# Patient Record
Sex: Male | Born: 1940 | Race: White | Hispanic: No | Marital: Married | Smoking: Never smoker
Health system: Southern US, Community
[De-identification: ages and names within clinical notes are randomized; demographics above are authoritative.]

## PROBLEM LIST (undated history)

## (undated) DIAGNOSIS — E039 Hypothyroidism, unspecified: Secondary | ICD-10-CM

## (undated) DIAGNOSIS — E236 Other disorders of pituitary gland: Secondary | ICD-10-CM

## (undated) DIAGNOSIS — I10 Essential (primary) hypertension: Secondary | ICD-10-CM

## (undated) DIAGNOSIS — I2510 Atherosclerotic heart disease of native coronary artery without angina pectoris: Secondary | ICD-10-CM

## (undated) DIAGNOSIS — I4891 Unspecified atrial fibrillation: Secondary | ICD-10-CM

## (undated) DIAGNOSIS — M1990 Unspecified osteoarthritis, unspecified site: Secondary | ICD-10-CM

## (undated) DIAGNOSIS — J189 Pneumonia, unspecified organism: Secondary | ICD-10-CM

## (undated) DIAGNOSIS — F419 Anxiety disorder, unspecified: Secondary | ICD-10-CM

## (undated) DIAGNOSIS — T7840XA Allergy, unspecified, initial encounter: Secondary | ICD-10-CM

## (undated) DIAGNOSIS — C61 Malignant neoplasm of prostate: Secondary | ICD-10-CM

## (undated) DIAGNOSIS — N200 Calculus of kidney: Secondary | ICD-10-CM

## (undated) DIAGNOSIS — C801 Malignant (primary) neoplasm, unspecified: Secondary | ICD-10-CM

## (undated) DIAGNOSIS — E119 Type 2 diabetes mellitus without complications: Secondary | ICD-10-CM

## (undated) DIAGNOSIS — G4733 Obstructive sleep apnea (adult) (pediatric): Secondary | ICD-10-CM

## (undated) DIAGNOSIS — C649 Malignant neoplasm of unspecified kidney, except renal pelvis: Secondary | ICD-10-CM

## (undated) DIAGNOSIS — Z96659 Presence of unspecified artificial knee joint: Secondary | ICD-10-CM

## (undated) HISTORY — DX: Essential (primary) hypertension: I10

## (undated) HISTORY — PX: INSERTION PROSTATE RADIATION SEED: SUR718

## (undated) HISTORY — DX: Allergy, unspecified, initial encounter: T78.40XA

## (undated) HISTORY — PX: KIDNEY STONE SURGERY: SHX686

## (undated) HISTORY — PX: NEPHRECTOMY: SHX65

## (undated) HISTORY — PX: CHOLECYSTECTOMY: SHX55

## (undated) HISTORY — PX: COLONOSCOPY W/ POLYPECTOMY: SHX1380

## (undated) HISTORY — PX: APPENDECTOMY: SHX54

## (undated) HISTORY — DX: Malignant neoplasm of prostate: C61

## (undated) HISTORY — PX: REPLACEMENT TOTAL KNEE BILATERAL: SUR1225

## (undated) HISTORY — DX: Obstructive sleep apnea (adult) (pediatric): G47.33

## (undated) HISTORY — DX: Calculus of kidney: N20.0

## (undated) NOTE — *Deleted (*Deleted)
Health Maintenance Due  Topic Date Due  . INFLUENZA VACCINE  01/28/2020   Depression screen [facility] 2/9 12/13/2019 07/31/2019 06/21/2019  Decreased Interest 0 0 0  Down, Depressed, Hopeless 0 0 0  PHQ - 2 Score 0 0 0  Altered sleeping 0 0 0  Tired, decreased energy 0 0 1  Change in appetite 0 0 0  Feeling bad or failure about yourself  0 0 0  Trouble concentrating 0 0 0  Moving slowly or fidgety/restless 0 0 0  Suicidal thoughts 0 0 0  PHQ-9 Score 0 0 1  Difficult doing work/chores Not difficult at all Not difficult at all Not difficult at all  Some recent data might be hidden

---

## 1999-01-28 ENCOUNTER — Encounter: Payer: Self-pay | Admitting: Orthopedic Surgery

## 1999-01-28 ENCOUNTER — Ambulatory Visit (HOSPITAL_COMMUNITY): Admission: RE | Admit: 1999-01-28 | Discharge: 1999-01-28 | Payer: Self-pay | Admitting: Orthopedic Surgery

## 2000-03-05 ENCOUNTER — Ambulatory Visit (HOSPITAL_BASED_OUTPATIENT_CLINIC_OR_DEPARTMENT_OTHER): Admission: RE | Admit: 2000-03-05 | Discharge: 2000-03-05 | Payer: Self-pay | Admitting: Internal Medicine

## 2000-09-15 ENCOUNTER — Encounter: Payer: Self-pay | Admitting: Emergency Medicine

## 2000-09-15 ENCOUNTER — Emergency Department (HOSPITAL_COMMUNITY): Admission: EM | Admit: 2000-09-15 | Discharge: 2000-09-15 | Payer: Self-pay | Admitting: Emergency Medicine

## 2000-10-19 ENCOUNTER — Observation Stay (HOSPITAL_COMMUNITY): Admission: RE | Admit: 2000-10-19 | Discharge: 2000-10-20 | Payer: Self-pay | Admitting: General Surgery

## 2000-10-19 ENCOUNTER — Encounter (INDEPENDENT_AMBULATORY_CARE_PROVIDER_SITE_OTHER): Payer: Self-pay

## 2001-08-24 ENCOUNTER — Encounter: Admission: RE | Admit: 2001-08-24 | Discharge: 2001-11-22 | Payer: Self-pay | Admitting: Internal Medicine

## 2003-05-30 LAB — HM COLONOSCOPY

## 2003-06-11 ENCOUNTER — Encounter: Payer: Self-pay | Admitting: Internal Medicine

## 2004-05-08 ENCOUNTER — Ambulatory Visit: Payer: Self-pay | Admitting: Internal Medicine

## 2004-07-14 ENCOUNTER — Ambulatory Visit: Payer: Self-pay | Admitting: Internal Medicine

## 2004-07-21 ENCOUNTER — Ambulatory Visit: Payer: Self-pay | Admitting: Internal Medicine

## 2004-07-24 ENCOUNTER — Encounter: Payer: Self-pay | Admitting: Internal Medicine

## 2004-07-28 ENCOUNTER — Encounter: Admission: RE | Admit: 2004-07-28 | Discharge: 2004-10-26 | Payer: Self-pay | Admitting: Internal Medicine

## 2004-08-13 ENCOUNTER — Ambulatory Visit: Payer: Self-pay | Admitting: Internal Medicine

## 2004-09-24 ENCOUNTER — Ambulatory Visit: Payer: Self-pay | Admitting: Internal Medicine

## 2005-01-14 ENCOUNTER — Ambulatory Visit: Payer: Self-pay | Admitting: Internal Medicine

## 2005-01-22 ENCOUNTER — Ambulatory Visit: Payer: Self-pay | Admitting: Internal Medicine

## 2005-02-05 ENCOUNTER — Ambulatory Visit: Payer: Self-pay | Admitting: Internal Medicine

## 2005-05-29 ENCOUNTER — Ambulatory Visit: Payer: Self-pay | Admitting: Internal Medicine

## 2005-06-02 ENCOUNTER — Ambulatory Visit: Payer: Self-pay | Admitting: Cardiology

## 2005-06-02 ENCOUNTER — Ambulatory Visit: Payer: Self-pay | Admitting: Internal Medicine

## 2005-06-29 HISTORY — PX: KNEE ARTHROSCOPY: SUR90

## 2005-07-06 ENCOUNTER — Ambulatory Visit: Payer: Self-pay | Admitting: Internal Medicine

## 2005-07-06 ENCOUNTER — Ambulatory Visit: Payer: Self-pay | Admitting: Cardiology

## 2005-10-09 ENCOUNTER — Ambulatory Visit: Payer: Self-pay | Admitting: Internal Medicine

## 2006-03-11 ENCOUNTER — Ambulatory Visit (HOSPITAL_COMMUNITY): Admission: RE | Admit: 2006-03-11 | Discharge: 2006-03-12 | Payer: Self-pay | Admitting: Orthopedic Surgery

## 2006-06-29 HISTORY — PX: CARDIAC CATHETERIZATION: SHX172

## 2006-09-07 ENCOUNTER — Ambulatory Visit: Payer: Self-pay | Admitting: Internal Medicine

## 2006-09-07 LAB — CONVERTED CEMR LAB
ALT: 28 units/L (ref 0–40)
AST: 26 units/L (ref 0–37)
Albumin: 3.2 g/dL — ABNORMAL LOW (ref 3.5–5.2)
Alkaline Phosphatase: 64 units/L (ref 39–117)
BUN: 21 mg/dL (ref 6–23)
Basophils Absolute: 0.1 10*3/uL (ref 0.0–0.1)
Basophils Relative: 0.7 % (ref 0.0–1.0)
Bilirubin, Direct: 0.1 mg/dL (ref 0.0–0.3)
CO2: 30 meq/L (ref 19–32)
Calcium: 9.2 mg/dL (ref 8.4–10.5)
Chloride: 104 meq/L (ref 96–112)
Cholesterol: 208 mg/dL (ref 0–200)
Creatinine, Ser: 0.9 mg/dL (ref 0.4–1.5)
Direct LDL: 62 mg/dL
Eosinophils Absolute: 0.4 10*3/uL (ref 0.0–0.6)
Eosinophils Relative: 4.6 % (ref 0.0–5.0)
GFR calc Af Amer: 109 mL/min
GFR calc non Af Amer: 90 mL/min
Glucose, Bld: 83 mg/dL (ref 70–99)
HCT: 43.4 % (ref 39.0–52.0)
HDL: 43.1 mg/dL (ref >39.0)
Hemoglobin: 14.9 g/dL (ref 13.0–17.0)
Lymphocytes Relative: 26.4 % (ref 12.0–46.0)
MCHC: 34.3 g/dL (ref 30.0–36.0)
MCV: 92.2 fL (ref 78.0–100.0)
Monocytes Absolute: 1 10*3/uL — ABNORMAL HIGH (ref 0.2–0.7)
Monocytes Relative: 10.9 % (ref 3.0–11.0)
Neutro Abs: 5.3 10*3/uL (ref 1.4–7.7)
Neutrophils Relative %: 57.4 % (ref 43.0–77.0)
PSA: 0.88 ng/mL (ref 0.10–4.00)
Platelets: 283 10*3/uL (ref 150–400)
Potassium: 4 meq/L (ref 3.5–5.1)
RBC: 4.71 M/uL (ref 4.22–5.81)
RDW: 12.6 % (ref 11.5–14.6)
Sodium: 143 meq/L (ref 135–145)
TSH: 3.22 microintl units/mL (ref 0.35–5.50)
Total Bilirubin: 0.7 mg/dL (ref 0.3–1.2)
Total CHOL/HDL Ratio: 4.8
Total Protein: 6.5 g/dL (ref 6.0–8.3)
Triglycerides: 233 mg/dL (ref 0–149)
VLDL: 47 mg/dL — ABNORMAL HIGH (ref 0–40)
WBC: 9.2 10*3/uL (ref 4.5–10.5)

## 2006-09-14 ENCOUNTER — Ambulatory Visit: Payer: Self-pay | Admitting: Internal Medicine

## 2006-10-13 ENCOUNTER — Ambulatory Visit: Payer: Self-pay

## 2006-10-21 ENCOUNTER — Ambulatory Visit: Payer: Self-pay | Admitting: Cardiology

## 2006-10-21 LAB — CONVERTED CEMR LAB
BUN: 13 mg/dL (ref 6–23)
Basophils Absolute: 0.1 10*3/uL (ref 0.0–0.1)
Eosinophils Absolute: 0.4 10*3/uL (ref 0.0–0.6)
GFR calc non Af Amer: 103 mL/min
HCT: 45 % (ref 39.0–52.0)
INR: 1 (ref 0.9–2.0)
MCHC: 34.6 g/dL (ref 30.0–36.0)
MCV: 91 fL (ref 78.0–100.0)
Monocytes Absolute: 1.2 10*3/uL — ABNORMAL HIGH (ref 0.2–0.7)
Monocytes Relative: 11.6 % — ABNORMAL HIGH (ref 3.0–11.0)
Neutrophils Relative %: 63.3 % (ref 43.0–77.0)
Potassium: 3.5 meq/L (ref 3.5–5.1)
RBC: 4.95 M/uL (ref 4.22–5.81)
RDW: 12.6 % (ref 11.5–14.6)
Sodium: 137 meq/L (ref 135–145)
aPTT: 24.4 s — ABNORMAL LOW (ref 26.5–36.5)

## 2006-10-22 ENCOUNTER — Inpatient Hospital Stay (HOSPITAL_BASED_OUTPATIENT_CLINIC_OR_DEPARTMENT_OTHER): Admission: RE | Admit: 2006-10-22 | Discharge: 2006-10-22 | Payer: Self-pay | Admitting: Internal Medicine

## 2006-10-22 ENCOUNTER — Ambulatory Visit: Payer: Self-pay | Admitting: Internal Medicine

## 2006-10-28 ENCOUNTER — Inpatient Hospital Stay (HOSPITAL_COMMUNITY): Admission: RE | Admit: 2006-10-28 | Discharge: 2006-10-31 | Payer: Self-pay | Admitting: Orthopedic Surgery

## 2006-10-28 DIAGNOSIS — Z96652 Presence of left artificial knee joint: Secondary | ICD-10-CM | POA: Insufficient documentation

## 2006-12-22 ENCOUNTER — Ambulatory Visit: Payer: Self-pay | Admitting: Internal Medicine

## 2006-12-27 ENCOUNTER — Ambulatory Visit: Payer: Self-pay | Admitting: Internal Medicine

## 2006-12-28 DIAGNOSIS — I10 Essential (primary) hypertension: Secondary | ICD-10-CM | POA: Insufficient documentation

## 2006-12-28 DIAGNOSIS — E291 Testicular hypofunction: Secondary | ICD-10-CM | POA: Insufficient documentation

## 2007-02-03 ENCOUNTER — Telehealth (INDEPENDENT_AMBULATORY_CARE_PROVIDER_SITE_OTHER): Payer: Self-pay | Admitting: *Deleted

## 2007-03-16 ENCOUNTER — Telehealth: Payer: Self-pay | Admitting: Internal Medicine

## 2007-04-12 ENCOUNTER — Ambulatory Visit: Payer: Self-pay | Admitting: Internal Medicine

## 2007-06-03 ENCOUNTER — Ambulatory Visit: Payer: Self-pay | Admitting: Internal Medicine

## 2007-06-28 ENCOUNTER — Telehealth: Payer: Self-pay | Admitting: Internal Medicine

## 2007-08-05 ENCOUNTER — Telehealth: Payer: Self-pay | Admitting: Internal Medicine

## 2007-09-09 ENCOUNTER — Ambulatory Visit: Payer: Self-pay | Admitting: Internal Medicine

## 2007-09-09 LAB — CONVERTED CEMR LAB
ALT: 30 units/L (ref 0–53)
Basophils Relative: 1 % (ref 0.0–1.0)
Bilirubin, Direct: 0.2 mg/dL (ref 0.0–0.3)
CO2: 30 meq/L (ref 19–32)
Calcium: 9.2 mg/dL (ref 8.4–10.5)
Eosinophils Relative: 3.6 % (ref 0.0–5.0)
GFR calc Af Amer: 109 mL/min
Glucose, Bld: 74 mg/dL (ref 70–99)
Glucose, Urine, Semiquant: NEGATIVE
HCT: 45 % (ref 39.0–52.0)
Hemoglobin: 15.3 g/dL (ref 13.0–17.0)
Lymphocytes Relative: 26.5 % (ref 12.0–46.0)
Monocytes Absolute: 0.9 10*3/uL — ABNORMAL HIGH (ref 0.2–0.7)
Neutro Abs: 5.5 10*3/uL (ref 1.4–7.7)
Neutrophils Relative %: 59.2 % (ref 43.0–77.0)
Nitrite: NEGATIVE
Platelets: 283 10*3/uL (ref 150–400)
Sodium: 143 meq/L (ref 135–145)
Specific Gravity, Urine: 1.025
Total Protein: 6.6 g/dL (ref 6.0–8.3)
VLDL: 27 mg/dL (ref 0–40)
WBC: 9.2 10*3/uL (ref 4.5–10.5)
pH: 5.5

## 2007-09-22 ENCOUNTER — Ambulatory Visit: Payer: Self-pay | Admitting: Internal Medicine

## 2007-09-22 LAB — CONVERTED CEMR LAB
Glucose, Urine, Semiquant: NEGATIVE
Urobilinogen, UA: 1
pH: 6

## 2007-09-26 ENCOUNTER — Telehealth: Payer: Self-pay | Admitting: Internal Medicine

## 2007-09-26 ENCOUNTER — Ambulatory Visit: Payer: Self-pay | Admitting: Cardiology

## 2007-09-28 ENCOUNTER — Ambulatory Visit: Payer: Self-pay | Admitting: Internal Medicine

## 2007-09-30 ENCOUNTER — Encounter: Payer: Self-pay | Admitting: Internal Medicine

## 2007-10-05 ENCOUNTER — Ambulatory Visit (HOSPITAL_COMMUNITY): Admission: RE | Admit: 2007-10-05 | Discharge: 2007-10-05 | Payer: Self-pay | Admitting: Urology

## 2007-10-17 ENCOUNTER — Telehealth: Payer: Self-pay | Admitting: Internal Medicine

## 2007-10-21 ENCOUNTER — Ambulatory Visit (HOSPITAL_COMMUNITY): Admission: RE | Admit: 2007-10-21 | Discharge: 2007-10-21 | Payer: Self-pay | Admitting: Urology

## 2007-10-21 ENCOUNTER — Encounter: Payer: Self-pay | Admitting: Internal Medicine

## 2007-10-24 ENCOUNTER — Encounter: Payer: Self-pay | Admitting: Internal Medicine

## 2007-10-28 ENCOUNTER — Encounter: Payer: Self-pay | Admitting: Internal Medicine

## 2007-11-30 ENCOUNTER — Encounter (INDEPENDENT_AMBULATORY_CARE_PROVIDER_SITE_OTHER): Payer: Self-pay | Admitting: Urology

## 2007-11-30 ENCOUNTER — Inpatient Hospital Stay (HOSPITAL_COMMUNITY): Admission: RE | Admit: 2007-11-30 | Discharge: 2007-12-05 | Payer: Self-pay | Admitting: Urology

## 2007-12-14 ENCOUNTER — Encounter: Payer: Self-pay | Admitting: Internal Medicine

## 2007-12-26 ENCOUNTER — Ambulatory Visit: Payer: Self-pay | Admitting: Internal Medicine

## 2007-12-26 DIAGNOSIS — Z85528 Personal history of other malignant neoplasm of kidney: Secondary | ICD-10-CM | POA: Insufficient documentation

## 2007-12-26 DIAGNOSIS — G4700 Insomnia, unspecified: Secondary | ICD-10-CM | POA: Insufficient documentation

## 2008-01-02 ENCOUNTER — Telehealth: Payer: Self-pay | Admitting: Internal Medicine

## 2008-02-09 ENCOUNTER — Encounter (INDEPENDENT_AMBULATORY_CARE_PROVIDER_SITE_OTHER): Payer: Self-pay | Admitting: Urology

## 2008-02-09 ENCOUNTER — Ambulatory Visit (HOSPITAL_COMMUNITY): Admission: RE | Admit: 2008-02-09 | Discharge: 2008-02-09 | Payer: Self-pay | Admitting: Urology

## 2008-02-14 ENCOUNTER — Ambulatory Visit: Payer: Self-pay | Admitting: Internal Medicine

## 2008-03-07 ENCOUNTER — Ambulatory Visit: Payer: Self-pay | Admitting: Internal Medicine

## 2008-03-15 ENCOUNTER — Telehealth: Payer: Self-pay | Admitting: Internal Medicine

## 2008-04-10 ENCOUNTER — Encounter: Payer: Self-pay | Admitting: Internal Medicine

## 2008-04-11 ENCOUNTER — Telehealth: Payer: Self-pay | Admitting: Internal Medicine

## 2008-07-02 ENCOUNTER — Telehealth: Payer: Self-pay | Admitting: Internal Medicine

## 2008-07-11 ENCOUNTER — Encounter: Payer: Self-pay | Admitting: Internal Medicine

## 2008-07-11 ENCOUNTER — Ambulatory Visit (HOSPITAL_COMMUNITY): Admission: RE | Admit: 2008-07-11 | Discharge: 2008-07-11 | Payer: Self-pay | Admitting: Urology

## 2008-07-26 ENCOUNTER — Ambulatory Visit: Payer: Self-pay | Admitting: Gastroenterology

## 2008-08-07 ENCOUNTER — Ambulatory Visit: Admission: RE | Admit: 2008-08-07 | Discharge: 2008-10-01 | Payer: Self-pay | Admitting: Radiation Oncology

## 2008-08-08 ENCOUNTER — Encounter: Payer: Self-pay | Admitting: Internal Medicine

## 2008-08-10 ENCOUNTER — Ambulatory Visit: Payer: Self-pay | Admitting: Gastroenterology

## 2008-08-27 ENCOUNTER — Inpatient Hospital Stay (HOSPITAL_COMMUNITY): Admission: AD | Admit: 2008-08-27 | Discharge: 2008-09-01 | Payer: Self-pay | Admitting: Internal Medicine

## 2008-08-27 ENCOUNTER — Ambulatory Visit: Payer: Self-pay | Admitting: Internal Medicine

## 2008-09-12 ENCOUNTER — Ambulatory Visit: Payer: Self-pay | Admitting: Internal Medicine

## 2008-10-01 ENCOUNTER — Ambulatory Visit: Admission: RE | Admit: 2008-10-01 | Discharge: 2008-12-09 | Payer: Self-pay | Admitting: Radiation Oncology

## 2008-10-02 ENCOUNTER — Ambulatory Visit: Payer: Self-pay | Admitting: Internal Medicine

## 2008-10-02 DIAGNOSIS — Z8546 Personal history of malignant neoplasm of prostate: Secondary | ICD-10-CM | POA: Insufficient documentation

## 2008-10-04 ENCOUNTER — Encounter: Admission: RE | Admit: 2008-10-04 | Discharge: 2009-01-02 | Payer: Self-pay | Admitting: Internal Medicine

## 2008-10-08 ENCOUNTER — Ambulatory Visit (HOSPITAL_BASED_OUTPATIENT_CLINIC_OR_DEPARTMENT_OTHER): Admission: RE | Admit: 2008-10-08 | Discharge: 2008-10-09 | Payer: Self-pay | Admitting: Urology

## 2008-10-23 ENCOUNTER — Encounter: Payer: Self-pay | Admitting: Internal Medicine

## 2008-11-06 ENCOUNTER — Encounter: Payer: Self-pay | Admitting: Internal Medicine

## 2008-11-13 ENCOUNTER — Ambulatory Visit: Payer: Self-pay | Admitting: Internal Medicine

## 2008-11-13 DIAGNOSIS — M1990 Unspecified osteoarthritis, unspecified site: Secondary | ICD-10-CM | POA: Insufficient documentation

## 2008-11-16 LAB — CONVERTED CEMR LAB
CO2: 29 meq/L (ref 19–32)
Calcium: 9.1 mg/dL (ref 8.4–10.5)
Creatinine, Ser: 1.4 mg/dL (ref 0.4–1.5)
Glucose, Bld: 84 mg/dL (ref 70–99)

## 2008-11-27 ENCOUNTER — Encounter: Payer: Self-pay | Admitting: Internal Medicine

## 2008-12-17 ENCOUNTER — Inpatient Hospital Stay (HOSPITAL_COMMUNITY): Admission: RE | Admit: 2008-12-17 | Discharge: 2008-12-19 | Payer: Self-pay | Admitting: Orthopedic Surgery

## 2008-12-17 DIAGNOSIS — Z96651 Presence of right artificial knee joint: Secondary | ICD-10-CM | POA: Insufficient documentation

## 2009-02-08 ENCOUNTER — Ambulatory Visit (HOSPITAL_COMMUNITY): Admission: RE | Admit: 2009-02-08 | Discharge: 2009-02-08 | Payer: Self-pay | Admitting: Urology

## 2009-02-12 ENCOUNTER — Ambulatory Visit: Payer: Self-pay | Admitting: Internal Medicine

## 2009-02-15 ENCOUNTER — Encounter: Payer: Self-pay | Admitting: Internal Medicine

## 2009-03-14 ENCOUNTER — Ambulatory Visit: Payer: Self-pay | Admitting: Internal Medicine

## 2009-04-01 ENCOUNTER — Encounter: Payer: Self-pay | Admitting: Internal Medicine

## 2009-05-06 ENCOUNTER — Ambulatory Visit: Payer: Self-pay | Admitting: Internal Medicine

## 2009-05-06 ENCOUNTER — Telehealth: Payer: Self-pay | Admitting: Internal Medicine

## 2009-05-06 DIAGNOSIS — M6530 Trigger finger, unspecified finger: Secondary | ICD-10-CM | POA: Insufficient documentation

## 2009-05-06 DIAGNOSIS — M79609 Pain in unspecified limb: Secondary | ICD-10-CM | POA: Insufficient documentation

## 2009-05-06 LAB — CONVERTED CEMR LAB
Basophils Absolute: 0.1 10*3/uL (ref 0.0–0.1)
Basophils Relative: 0.6 % (ref 0.0–3.0)
CO2: 31 meq/L (ref 19–32)
Calcium: 9.1 mg/dL (ref 8.4–10.5)
Chloride: 99 meq/L (ref 96–112)
Creatinine, Ser: 1.4 mg/dL (ref 0.4–1.5)
Eosinophils Absolute: 0.4 10*3/uL (ref 0.0–0.7)
Glucose, Bld: 85 mg/dL (ref 70–99)
Hemoglobin: 15.1 g/dL (ref 13.0–17.0)
Lymphocytes Relative: 22.9 % (ref 12.0–46.0)
MCHC: 34.3 g/dL (ref 30.0–36.0)
MCV: 90.5 fL (ref 78.0–100.0)
Monocytes Absolute: 1 10*3/uL (ref 0.1–1.0)
Neutro Abs: 5.4 10*3/uL (ref 1.4–7.7)
RBC: 4.88 M/uL (ref 4.22–5.81)
RDW: 13.8 % (ref 11.5–14.6)

## 2009-06-03 ENCOUNTER — Telehealth: Payer: Self-pay | Admitting: Internal Medicine

## 2009-06-19 ENCOUNTER — Encounter: Payer: Self-pay | Admitting: Internal Medicine

## 2009-06-29 DIAGNOSIS — G250 Essential tremor: Secondary | ICD-10-CM | POA: Insufficient documentation

## 2009-10-01 ENCOUNTER — Telehealth: Payer: Self-pay | Admitting: Internal Medicine

## 2009-10-10 ENCOUNTER — Encounter: Payer: Self-pay | Admitting: Internal Medicine

## 2009-10-15 ENCOUNTER — Encounter: Payer: Self-pay | Admitting: Internal Medicine

## 2009-11-27 ENCOUNTER — Ambulatory Visit: Payer: Self-pay | Admitting: Internal Medicine

## 2009-11-27 DIAGNOSIS — L570 Actinic keratosis: Secondary | ICD-10-CM | POA: Insufficient documentation

## 2009-12-04 ENCOUNTER — Encounter: Payer: Self-pay | Admitting: Internal Medicine

## 2010-02-13 ENCOUNTER — Ambulatory Visit: Payer: Self-pay | Admitting: Family Medicine

## 2010-03-04 ENCOUNTER — Ambulatory Visit: Payer: Self-pay | Admitting: Internal Medicine

## 2010-03-07 LAB — CONVERTED CEMR LAB
BUN: 17 mg/dL (ref 6–23)
Bilirubin, Direct: 0.2 mg/dL (ref 0.0–0.3)
CRP, High Sensitivity: 77.8 — ABNORMAL HIGH (ref 0.00–5.00)
Calcium: 8.7 mg/dL (ref 8.4–10.5)
Creatinine, Ser: 1.3 mg/dL (ref 0.4–1.5)
GFR calc non Af Amer: 58.4 mL/min (ref >60)
Sed Rate: 23 mm/hr — ABNORMAL HIGH (ref 0–22)
Total Bilirubin: 0.7 mg/dL (ref 0.3–1.2)

## 2010-03-14 ENCOUNTER — Encounter: Payer: Self-pay | Admitting: Internal Medicine

## 2010-03-24 ENCOUNTER — Ambulatory Visit: Payer: Self-pay | Admitting: Internal Medicine

## 2010-03-28 LAB — CONVERTED CEMR LAB: CRP, High Sensitivity: 7.53 — ABNORMAL HIGH (ref 0.00–5.00)

## 2010-04-16 ENCOUNTER — Telehealth: Payer: Self-pay | Admitting: Internal Medicine

## 2010-04-30 ENCOUNTER — Encounter
Admission: RE | Admit: 2010-04-30 | Discharge: 2010-05-13 | Payer: Self-pay | Source: Home / Self Care | Attending: Internal Medicine | Admitting: Internal Medicine

## 2010-05-09 ENCOUNTER — Ambulatory Visit: Payer: Self-pay | Admitting: Internal Medicine

## 2010-06-06 ENCOUNTER — Encounter: Payer: Self-pay | Admitting: Internal Medicine

## 2010-07-31 NOTE — Assessment & Plan Note (Signed)
Summary: wart near eye//ccm   Procedure Note Last Tetanus: Historical (06/29/1994)  Mole Biopsy/Removal: Onset of lesion: months  Procedure # 1: liquid nitrogen    Size (in cm): 0.5 x 0.5    Location: left lower eyelid    Comment: verbal consent    Anesthesia: none   Allergies: No Known Drug Allergies   Impression & Recommendations: AK near lower left eyelid liquid NTG  Complete Medication List: 1)  Cozaar 100 Mg Tabs (Losartan potassium) .... Take 1 tablet by mouth once a day 2)  Celexa 20 Mg Tabs (Citalopram hydrobromide) .... Take 1 tablet by mouth once a day 3)  Trazodone Hcl 50 Mg Tabs (Trazodone hcl) .... 1/2-1 tab at bedtime as needed 4)  Hydrochlorothiazide 25 Mg Tabs (Hydrochlorothiazide) .... Take 1 tab by mouth every morning 5)  Transderm-scop 1.5 Mg Pt72 (Scopolamine base) .... Apply q 3 days 6)  Proair Hfa 108 (90 Base) Mcg/act Aers (Albuterol sulfate) .... 2 inh q4h as needed shortness of breath 7)  Celebrex 200 Mg Caps (Celecoxib) .... Take 1 capsule by mouth once a day 8)  Tylenol 325 Mg Tabs (Acetaminophen) .... Take 2 four times a day 9)  Xenical 120 Mg Caps (Orlistat)  Other Orders: Prescription Created Electronically [phone]) Cryotherapy/Destruction benign or premalignant lesion (1st lesion)  (17000) Prescriptions: TRAZODONE HCL 50 MG TABS (TRAZODONE HCL) 1/2-1 tab at bedtime as needed  #30 x PRN   Entered and Authorized by:   Birdie Sons MD   Signed by:   Birdie Sons MD on 11/27/2009   Method used:   Electronically to        [facility] (retail)       [address]       [place], [place]  [address]       Ph: [phone] or [phone]       Fax: [phone]   RxID:   [id number]

## 2010-07-31 NOTE — Assessment & Plan Note (Signed)
Summary: chills,sweats/njr   Vital Signs:  Patient profile:   70 year old male Height:      69.5 inches Weight:      256 pounds BMI:     37.40 Temp:     98.4 degrees F oral BP sitting:   120 / 80  (left arm) Cuff size:   large  Vitals Entered By: Kern Reap CMA Duncan Dull) (March 04, 2010 11:33 AM) CC: nausea, chills, flushing Is Patient Diabetic? No Pain Assessment Patient in pain? no        CC:  nausea, chills, and flushing.  History of Present Illness: reviewed note form 8/11 he admits to having felt better after initial viral illness 2 days ago he developed some chills and gagging sensation associated with some sweating. GI sxs have resolved no abd pain, no other associated sxs  All other systems reviewed and were negative    Current Medications (verified): 1)  Cozaar 100 Mg Tabs (Losartan Potassium) .... Take 1 Tablet By Mouth Once A Day 2)  Celexa 20 Mg Tabs (Citalopram Hydrobromide) .... Take 1 Tablet By Mouth Once A Day 3)  Trazodone Hcl 50 Mg Tabs (Trazodone Hcl) .... 1/2-1 Tab At Bedtime As Needed 4)  Hydrochlorothiazide 25 Mg  Tabs (Hydrochlorothiazide) .... Take 1 Tab By Mouth Every Morning 5)  Proair Hfa 108 (90 Base) Mcg/act  Aers (Albuterol Sulfate) .... 2 Inh Q4h As Needed Shortness of Breath  Allergies (verified): No Known Drug Allergies  Review of Systems       Flu Vaccine Consent Questions     Do you have a history of severe allergic reactions to this vaccine? no    Any prior history of allergic reactions to egg and/or gelatin? no    Do you have a sensitivity to the preservative Thimersol? no    Do you have a past history of Guillan-Barre Syndrome? no    Do you currently have an acute febrile illness? no    Have you ever had a severe reaction to latex? no    Vaccine information given and explained to patient? yes    Are you currently pregnant? no    Lot Number:[id number]   Exp Date:12/27/2010   Site Given  Left Deltoid IM   Physical  Exam  General:  alert and well-developed.   Head:  normocephalic and atraumatic.   Eyes:  pupils equal.  no icterus Neck:  No deformities, masses, or tenderness noted. Lungs:  normal respiratory effort and no intercostal retractions.   Heart:  normal rate and regular rhythm.   Abdomen:  soft, non-tender, normal bowel sounds, no distention, and no masses.   Msk:  No deformity or scoliosis noted of thoracic or lumbar spine.   Skin:  turgor normal and color normal.   Psych:  normally interactive and good eye contact.     Impression & Recommendations:  Problem # 1:  VIRAL INFECTION-UNSPEC (ICD-079.99)  given duration will check labs reviewed previous colonoscopy  The following medications were removed from the medication list:    Celebrex 200 Mg Caps (Celecoxib) .Marland Kitchen... Take 1 capsule by mouth once a day    Tylenol 325 Mg Tabs (Acetaminophen) .Marland Kitchen... Take 2 four times a day  Discussed symptomatic relief.   Orders: Venipuncture (57846) T-H Pylori Antibody IgM (96295-28413) TLB-BMP (Basic Metabolic Panel-BMET) (80048-METABOL) TLB-Hepatic/Liver Function Pnl (80076-HEPATIC) TLB-Sedimentation Rate (ESR) (85652-ESR) TLB-CRP-High Sensitivity (C-Reactive Protein) (86140-FCRP)  Complete Medication List: 1)  Cozaar 100 Mg Tabs (Losartan potassium) .... Take 1 tablet  by mouth once a day 2)  Celexa 20 Mg Tabs (Citalopram hydrobromide) .... Take 1 tablet by mouth once a day 3)  Trazodone Hcl 50 Mg Tabs (Trazodone hcl) .... 1/2-1 tab at bedtime as needed 4)  Hydrochlorothiazide 25 Mg Tabs (Hydrochlorothiazide) .... Take 1 tab by mouth every morning 5)  Proair Hfa 108 (90 Base) Mcg/act Aers (Albuterol sulfate) .... 2 inh q4h as needed shortness of breath 6)  Metronidazole 500 Mg Tabs (Metronidazole) .Marland Kitchen.. 1 by mouth three times a day for 7 days (no alcohol while on abx)...  Other Orders: Flu Vaccine 61yrs + MEDICARE PATIENTS (U0454) Administration Flu vaccine - MCR (G0008)

## 2010-07-31 NOTE — Letter (Signed)
Summary: [facility]  [facility]   Imported By: Maryln Gottron 10/28/2009 09:47:04  _____________________________________________________________________  External Attachment:    Type:   Image     Comment:   External Document

## 2010-07-31 NOTE — Letter (Signed)
Summary: [facility]  [facility]   Imported By: Maryln Gottron 07/11/2009 12:17:46  _____________________________________________________________________  External Attachment:    Type:   Image     Comment:   External Document

## 2010-07-31 NOTE — Assessment & Plan Note (Signed)
Summary: consult re: stomach issues/cjr   Vital Signs:  Patient profile:   70 year old male Weight:      260 pounds Temp:     97.6 degrees F oral BP sitting:   118 / 70  (left arm) Cuff size:   large  Vitals Entered By: Sid Falcon LPN (February 13, 2010 8:24 AM)  History of Present Illness: Same-day appointment. Onset this past Saturday of some nausea and vomiting. Vomiting had ceased by Sunday but he developed diarrhea several nonbloody stools per day. Has already had 3 diarrhea stools this morning. Some diffuse abdominal cramping. [place] did not drink any natural water. No sick contacts. Denies any recent antibiotics. No fevers. Had chills initially first couple days but these have improved. Has not taken any medications for diarrhea. Keeping down plenty of fluids. Has previously been on Xenical but not taking past several days.    Allergies (verified): No Known Drug Allergies  Past History:  Past Medical History: Last updated: 08/27/2008 Hypertension Kidney Stone OSA RCCA prostate cancer  Physical Exam  General:  Well-developed,well-nourished,in no acute distress; alert,appropriate and cooperative throughout examination Mouth:  Oral mucosa and oropharynx without lesions or exudates.  Teeth in good repair. Neck:  No deformities, masses, or tenderness noted. Lungs:  Normal respiratory effort, chest expands symmetrically. Lungs are clear to auscultation, no crackles or wheezes. Heart:  Normal rate and regular rhythm. S1 and S2 normal without gallop, murmur, click, rub or other extra sounds. Abdomen:  soft, non-tender, normal bowel sounds, no distention, no masses, no guarding, no rigidity, no hepatomegaly, and no splenomegaly.     Impression & Recommendations:  Problem # 1:  DIARRHEA (ICD-787.91) suspect viral illness. Try Imodium. Plenty fluids and potassium.  Complete Medication List: 1)  Cozaar 100 Mg Tabs (Losartan potassium) .... Take 1  tablet by mouth once a day 2)  Celexa 20 Mg Tabs (Citalopram hydrobromide) .... Take 1 tablet by mouth once a day 3)  Trazodone Hcl 50 Mg Tabs (Trazodone hcl) .... 1/2-1 tab at bedtime as needed 4)  Hydrochlorothiazide 25 Mg Tabs (Hydrochlorothiazide) .... Take 1 tab by mouth every morning 5)  Transderm-scop 1.5 Mg Pt72 (Scopolamine base) .... Apply q 3 days 6)  Proair Hfa 108 (90 Base) Mcg/act Aers (Albuterol sulfate) .... 2 inh q4h as needed shortness of breath 7)  Celebrex 200 Mg Caps (Celecoxib) .... Take 1 capsule by mouth once a day 8)  Tylenol 325 Mg Tabs (Acetaminophen) .... Take 2 four times a day 9)  Xenical 120 Mg Caps (Orlistat)  Patient Instructions: 1)  Eat more potassium rich foods such as bananas, oranje juice, and salt substitutes .  2)  The main problem with gastroentereritis is dehydration. Drink plenty of fluids and take solids as you feel better. If you are unable to keep anything down and/or you show signs of dehydration( dry cracked lips, lack of tears, not urinating, very sleepy) , call our office.  3)  Try OTC Imodium and be in touch by early next week if not improving.

## 2010-07-31 NOTE — Letter (Signed)
Summary: [facility]  [facility]   Imported By: Maryln Gottron 12/23/2009 14:52:09  _____________________________________________________________________  External Attachment:    Type:   Image     Comment:   External Document

## 2010-07-31 NOTE — Progress Notes (Signed)
Summary: Needs Referral to Nutrionist  Phone Note Call from Patient Call back at [phone] (CELL)   Caller: Patient Summary of Call: Needs referral to nutritionist.  Initial call taken by: Cinda Quest,  April 16, 2010 1:02 PM  Follow-up for Phone Call        ok Follow-up by: Birdie Sons MD,  April 16, 2010 3:45 PM

## 2010-07-31 NOTE — Letter (Signed)
Summary: [facility]  [facility]   Imported By: Maryln Gottron 03/25/2010 15:09:08  _____________________________________________________________________  External Attachment:    Type:   Image     Comment:   External Document

## 2010-07-31 NOTE — Letter (Signed)
Summary: [facility]  [facility]   Imported By: Maryln Gottron 07/04/2010 15:37:20  _____________________________________________________________________  External Attachment:    Type:   Image     Comment:   External Document

## 2010-07-31 NOTE — Progress Notes (Signed)
Summary: rx for allergies.  Phone Note Call from Patient   Caller: Patient Call For: Birdie Sons MD Summary of Call: Pt is asking for RX for allergies, runny eyes, nose and dry cough.  Karin Golden ([place]). [phone]  No fever.  Is sure it is allergy. Not taking anything right now. Initial call taken by: Lynann Beaver CMA,  October 01, 2009 2:05 PM  Follow-up for Phone Call        allergy meds are now OTC: claritin, allegra or zyrtec are ok Follow-up by: Birdie Sons MD,  October 01, 2009 4:22 PM  Additional Follow-up for Phone Call Additional follow up Details #1::        Phone Call Completed Additional Follow-up by: Rudy Jew, RN,  October 01, 2009 4:31 PM

## 2010-07-31 NOTE — Letter (Signed)
Summary: [facility]  [facility]   Imported By: Maryln Gottron 11/04/2009 10:40:54  _____________________________________________________________________  External Attachment:    Type:   Image     Comment:   External Document

## 2010-08-20 ENCOUNTER — Other Ambulatory Visit (HOSPITAL_COMMUNITY): Payer: Self-pay | Admitting: Orthopedic Surgery

## 2010-08-20 DIAGNOSIS — M25569 Pain in unspecified knee: Secondary | ICD-10-CM

## 2010-08-26 ENCOUNTER — Encounter (HOSPITAL_COMMUNITY)
Admission: RE | Admit: 2010-08-26 | Discharge: 2010-08-26 | Disposition: A | Discharge status: Home / Self Care | Payer: Medicare Other | Source: Ambulatory Visit | Attending: Orthopedic Surgery | Admitting: Orthopedic Surgery

## 2010-08-26 ENCOUNTER — Encounter (HOSPITAL_COMMUNITY): Payer: Self-pay

## 2010-08-26 ENCOUNTER — Other Ambulatory Visit (HOSPITAL_COMMUNITY): Payer: Self-pay | Admitting: Orthopedic Surgery

## 2010-08-26 ENCOUNTER — Ambulatory Visit (HOSPITAL_COMMUNITY)
Admission: RE | Admit: 2010-08-26 | Discharge: 2010-08-26 | Disposition: A | Discharge status: Home / Self Care | Payer: Medicare Other | Source: Ambulatory Visit | Attending: Orthopedic Surgery | Admitting: Orthopedic Surgery

## 2010-08-26 DIAGNOSIS — M25569 Pain in unspecified knee: Secondary | ICD-10-CM

## 2010-08-26 DIAGNOSIS — Z905 Acquired absence of kidney: Secondary | ICD-10-CM | POA: Insufficient documentation

## 2010-08-26 DIAGNOSIS — Z96659 Presence of unspecified artificial knee joint: Secondary | ICD-10-CM | POA: Insufficient documentation

## 2010-08-26 DIAGNOSIS — Z8546 Personal history of malignant neoplasm of prostate: Secondary | ICD-10-CM | POA: Insufficient documentation

## 2010-08-26 DIAGNOSIS — Z85528 Personal history of other malignant neoplasm of kidney: Secondary | ICD-10-CM | POA: Insufficient documentation

## 2010-08-26 HISTORY — DX: Presence of unspecified artificial knee joint: Z96.659

## 2010-08-26 HISTORY — DX: Malignant (primary) neoplasm, unspecified: C80.1

## 2010-08-26 MED ORDER — TECHNETIUM TC 99M MEDRONATE IV KIT
23.7000 | PACK | Freq: Once | INTRAVENOUS | Status: AC | PRN
  Administered 2010-08-26: 23.7 via INTRAVENOUS

## 2010-09-17 ENCOUNTER — Other Ambulatory Visit: Payer: Self-pay | Admitting: Internal Medicine

## 2010-10-06 LAB — CBC
Hemoglobin: 11.9 g/dL — ABNORMAL LOW (ref 13.0–17.0)
Hemoglobin: 12.8 g/dL — ABNORMAL LOW (ref 13.0–17.0)
MCHC: 34 g/dL (ref 30.0–36.0)
MCHC: 34.1 g/dL (ref 30.0–36.0)
MCHC: 34.3 g/dL (ref 30.0–36.0)
MCV: 94.7 fL (ref 78.0–100.0)
MCV: 95 fL (ref 78.0–100.0)
Platelets: 298 10*3/uL (ref 150–400)
RBC: 3.69 MIL/uL — ABNORMAL LOW (ref 4.22–5.81)
RBC: 3.95 MIL/uL — ABNORMAL LOW (ref 4.22–5.81)
RDW: 14.3 % (ref 11.5–15.5)
RDW: 14.2 % (ref 11.5–15.5)
WBC: 14.4 10*3/uL — ABNORMAL HIGH (ref 4.0–10.5)

## 2010-10-06 LAB — PROTIME-INR
INR: 1 (ref 0.00–1.49)
Prothrombin Time: 13.6 seconds (ref 11.6–15.2)

## 2010-10-06 LAB — URINALYSIS, ROUTINE W REFLEX MICROSCOPIC
Bilirubin Urine: NEGATIVE
Ketones, ur: NEGATIVE mg/dL
Nitrite: NEGATIVE
Protein, ur: NEGATIVE mg/dL
Urobilinogen, UA: 1 mg/dL (ref 0.0–1.0)
pH: 6.5 (ref 5.0–8.0)

## 2010-10-06 LAB — APTT: aPTT: 21 seconds — ABNORMAL LOW (ref 24–37)

## 2010-10-06 LAB — BASIC METABOLIC PANEL
CO2: 31 mEq/L (ref 19–32)
CO2: 32 mEq/L (ref 19–32)
CO2: 26 mEq/L (ref 19–32)
Calcium: 9 mg/dL (ref 8.4–10.5)
Chloride: 103 mEq/L (ref 96–112)
Chloride: 103 mEq/L (ref 96–112)
Creatinine, Ser: 1.4 mg/dL (ref 0.4–1.5)
Creatinine, Ser: 1.54 mg/dL — ABNORMAL HIGH (ref 0.4–1.5)
Creatinine, Ser: 1.21 mg/dL (ref 0.4–1.5)
GFR calc Af Amer: 55 mL/min — ABNORMAL LOW (ref >60)
GFR calc Af Amer: >60 mL/min (ref >60)
GFR calc Af Amer: >60 mL/min (ref >60)
GFR calc non Af Amer: >60 mL/min (ref >60)
Glucose, Bld: 105 mg/dL — ABNORMAL HIGH (ref 70–99)
Glucose, Bld: 94 mg/dL (ref 70–99)
Potassium: 5 mEq/L (ref 3.5–5.1)
Sodium: 138 mEq/L (ref 135–145)

## 2010-10-06 LAB — TYPE AND SCREEN

## 2010-10-06 LAB — DIFFERENTIAL
Basophils Absolute: 0 10*3/uL (ref 0.0–0.1)
Basophils Relative: 0 % (ref 0–1)
Neutro Abs: 11.1 10*3/uL — ABNORMAL HIGH (ref 1.7–7.7)
Neutrophils Relative %: 79 % — ABNORMAL HIGH (ref 43–77)

## 2010-10-08 LAB — CBC
Hemoglobin: 13.4 g/dL (ref 13.0–17.0)
MCHC: 34.8 g/dL (ref 30.0–36.0)
RDW: 13.8 % (ref 11.5–15.5)

## 2010-10-08 LAB — COMPREHENSIVE METABOLIC PANEL
ALT: 31 U/L (ref 0–53)
Calcium: 9 mg/dL (ref 8.4–10.5)
Glucose, Bld: 86 mg/dL (ref 70–99)
Sodium: 138 mEq/L (ref 135–145)
Total Protein: 6 g/dL (ref 6.0–8.3)

## 2010-10-08 LAB — PROTIME-INR
INR: 1 (ref 0.00–1.49)
Prothrombin Time: 13.5 seconds (ref 11.6–15.2)

## 2010-10-09 LAB — CBC
HCT: 39.2 % (ref 39.0–52.0)
Platelets: 223 10*3/uL (ref 150–400)
Platelets: 232 10*3/uL (ref 150–400)
RDW: 13.9 % (ref 11.5–15.5)
WBC: 11.7 10*3/uL — ABNORMAL HIGH (ref 4.0–10.5)

## 2010-10-09 LAB — HEMOGLOBIN A1C
Hgb A1c MFr Bld: 5.7 % (ref 4.6–6.1)
Mean Plasma Glucose: 117 mg/dL

## 2010-10-09 LAB — BASIC METABOLIC PANEL
BUN: 15 mg/dL (ref 6–23)
BUN: 19 mg/dL (ref 6–23)
Calcium: 8.3 mg/dL — ABNORMAL LOW (ref 8.4–10.5)
Calcium: 8.7 mg/dL (ref 8.4–10.5)
Creatinine, Ser: 1.52 mg/dL — ABNORMAL HIGH (ref 0.4–1.5)
GFR calc non Af Amer: 46 mL/min — ABNORMAL LOW (ref >60)
GFR calc non Af Amer: 49 mL/min — ABNORMAL LOW (ref >60)
Glucose, Bld: 212 mg/dL — ABNORMAL HIGH (ref 70–99)
Glucose, Bld: 89 mg/dL (ref 70–99)
Sodium: 136 mEq/L (ref 135–145)

## 2010-10-09 LAB — AMYLASE: Amylase: 79 U/L (ref 27–131)

## 2010-10-09 LAB — COMPREHENSIVE METABOLIC PANEL
AST: 28 U/L (ref 0–37)
Albumin: 3.2 g/dL — ABNORMAL LOW (ref 3.5–5.2)
Alkaline Phosphatase: 69 U/L (ref 39–117)
Chloride: 97 mEq/L (ref 96–112)
GFR calc Af Amer: 54 mL/min — ABNORMAL LOW (ref >60)
Potassium: 3.4 mEq/L — ABNORMAL LOW (ref 3.5–5.1)
Sodium: 133 mEq/L — ABNORMAL LOW (ref 135–145)
Total Bilirubin: 0.7 mg/dL (ref 0.3–1.2)

## 2010-10-09 LAB — URINALYSIS, ROUTINE W REFLEX MICROSCOPIC
Bilirubin Urine: NEGATIVE
Nitrite: NEGATIVE
Specific Gravity, Urine: 1.013 (ref 1.005–1.030)
pH: 7.5 (ref 5.0–8.0)

## 2010-10-09 LAB — GLUCOSE, CAPILLARY
Glucose-Capillary: 130 mg/dL — ABNORMAL HIGH (ref 70–99)
Glucose-Capillary: 134 mg/dL — ABNORMAL HIGH (ref 70–99)
Glucose-Capillary: 158 mg/dL — ABNORMAL HIGH (ref 70–99)
Glucose-Capillary: 182 mg/dL — ABNORMAL HIGH (ref 70–99)
Glucose-Capillary: 182 mg/dL — ABNORMAL HIGH (ref 70–99)

## 2010-10-09 LAB — URINE MICROSCOPIC-ADD ON

## 2010-11-07 ENCOUNTER — Encounter: Payer: Self-pay | Admitting: Internal Medicine

## 2010-11-08 ENCOUNTER — Other Ambulatory Visit: Payer: Self-pay | Admitting: Internal Medicine

## 2010-11-10 ENCOUNTER — Encounter: Payer: Self-pay | Admitting: Internal Medicine

## 2010-11-10 ENCOUNTER — Ambulatory Visit (INDEPENDENT_AMBULATORY_CARE_PROVIDER_SITE_OTHER): Payer: Medicare Other | Admitting: Internal Medicine

## 2010-11-10 DIAGNOSIS — R451 Restlessness and agitation: Secondary | ICD-10-CM

## 2010-11-10 DIAGNOSIS — IMO0002 Reserved for concepts with insufficient information to code with codable children: Secondary | ICD-10-CM

## 2010-11-10 MED ORDER — FLUTICASONE PROPIONATE 50 MCG/ACT NA SUSP: 1.0000 | Freq: Every day | NASAL | Status: DC

## 2010-11-10 MED ORDER — LOSARTAN POTASSIUM 100 MG PO TABS: 50.0000 mg | ORAL_TABLET | Freq: Every day | ORAL | Status: DC

## 2010-11-10 MED ORDER — NORTRIPTYLINE HCL 25 MG PO CAPS: 25.0000 mg | ORAL_CAPSULE | Freq: Every day | ORAL | Status: DC

## 2010-11-10 MED ORDER — SERTRALINE HCL 100 MG PO TABS: 100.0000 mg | ORAL_TABLET | Freq: Every day | ORAL | Status: DC

## 2010-11-10 MED ORDER — HYDROCHLOROTHIAZIDE 25 MG PO TABS: 12.5000 mg | ORAL_TABLET | Freq: Every day | ORAL | Status: DC

## 2010-11-10 NOTE — Progress Notes (Signed)
  Subjective:    Patient ID: Wayne Booth, male    DOB: Apr 08, 1941, 70 y.o.   MRN: [id number]  HPI  Patient Active Problem List  Diagnoses  . 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 CARCINOMA, RENAL CELL-has regular f/u with urology  . TESTOSTERONE DEFICIENCY---no replacement therapy  . MORBID OBESITY---trying to exercise  . HYPERTENSION---tolerating meds--but does report orthostatic sxs fairly frequently  .   .   .   .   Marland Kitchen    New complaint---feels a bit more agitated than usual . Would like to increase citalopram Not sleeping as well as usual---stressors   Past Medical History  Diagnosis Date  . Cancer   . History of total knee replacement   . Hypertension   . Kidney stone   . OSA (obstructive sleep apnea)   . Prostate cancer    Past Surgical History  Procedure Date  . Appendectomy   . Cholecystectomy   . Nephrectomy   . Insertion prostate radiation seed   . Replacement total knee bilateral   . Knee arthroscopy 2007    left    reports that he has been smoking Cigars.  He does not have any smokeless tobacco history on file. His alcohol and drug histories not on file. family history is not on file. No Known Allergies  Review of Systems  patient denies chest pain, shortness of breath, orthopnea. Denies lower extremity edema, abdominal pain, change in appetite, change in bowel movements. Patient denies rashes, musculoskeletal complaints. No other specific complaints in a complete review of systems.      Objective:   Physical Exam  well-developed well-nourished male in no acute distress. HEENT exam atraumatic, normocephalic, neck supple without jugular venous distention. Chest clear to auscultation cardiac exam S1-S2 are regular. Abdominal exam overweight with bowel sounds, soft and nontender. Extremities no edema. Neurologic exam is alert with a normal gait.        Assessment & Plan:

## 2010-11-11 NOTE — Op Note (Signed)
NAME:  Wayne Booth, Wayne Booth NO.:  [id number]   MEDICAL RECORD NO.:  [id number]          PATIENT TYPE:  AMB   LOCATION:  DAY                          FACILITY:  [facility]   PHYSICIAN:  Heloise Purpura, MD      DATE OF BIRTH:  08-12-1940   DATE OF PROCEDURE:  02/09/2008  DATE OF DISCHARGE:                               OPERATIVE REPORT   PREOPERATIVE DIAGNOSIS:  Gross hematuria.   POSTOPERATIVE DIAGNOSIS:  Gross hematuria.   PROCEDURE:  1. Cystoscopy.  2. Right retrograde pyelography.  3. Saline  bladder washing for cytology.   SURGEON:  Dr. Heloise Purpura.   ANESTHESIA:  LMA anesthesia.   COMPLICATIONS:  None.   INDICATIONS FOR PROCEDURE:  [name] is a 70 year old gentleman with a  history of gross hematuria.  On his initial evaluation, he was found to  have a left renal mass, concerning for renal malignancy and did undergo  a left nephrectomy; however, his hematuria persisted and no obvious  explanation was available.  It was therefore decided to proceed with a  more thorough evaluation, including a cystoscopy and retrograde  pyelography with possible ureteroscopy or biopsies, as indicated.  The  potential risks, complications and the potential alternative options  were discussed in detail with the patient, and an informed consent was  obtained.   DESCRIPTION OF PROCEDURE:  The patient was taken to the operating room  and an LMA was administered.  He was given preoperative antibiotics and  placed in the dorsal lithotomy position and prepped and draped in the  usual sterile fashion.  Next, a preoperative time out was performed.  A  cystourethroscopy was then performed.  The bladder was systematically  examined.  The anterior urethra appeared normal with a hyperemic  prostatic urethra, which was consistent with a potential source for his  gross hematuria.  The bladder demonstrated no evidence of any bladder  tumors, stones or other mucosal pathology.  The ureteral  orifices were  in the normal anatomic position and effluxing clear urine.   Attention then turned to the right ureteral orifice which was cannulated  with a 6-French ureteral catheter.  Omnipaque was injected and  demonstrated a normal caliber ureter without filling defects.  The renal  pelvis also demonstrated no evidence of filling defects or dilation or  other abnormalities.  There was noted to be an air bubble that was  freely mobile that was seen in the proximal ureter initially, and then  subsequently was able to be shown to  be a normal area of urothelium on subsequent images.  A saline bladder  washing had also been obtained and was sent for urine cytology.   The patient tolerated the procedure well, without complications.      Heloise Purpura, MD  Electronically Signed     LB/MEDQ  D:  02/09/2008  T:  02/09/2008  Job:  [id number]

## 2010-11-11 NOTE — Discharge Summary (Signed)
NAME:  Wayne Booth, Wayne Booth NO.:  [id number]   MEDICAL RECORD NO.:  [id number]          PATIENT TYPE:  INP   LOCATION:  3022                         FACILITY:  [facility]   PHYSICIAN:  Valerie A. Felicity Coyer, MDDATE OF BIRTH:  July 01, 1940   DATE OF ADMISSION:  08/27/2008  DATE OF DISCHARGE:  09/01/2008                               DISCHARGE SUMMARY   DISCHARGE DIAGNOSES:  1. Pneumonia, improved.  Continue outpatient antibiotic and      symptomatic therapy.  2. Nausea and vomiting, exacerbated by above, question viral trigger      symptoms now resolved.  KUB negative for obstruction.  3. Dehydration secondary to above status post intravenous fluid with      return to baseline creatinine 1.45 prior to discharge.  4. Hypertension.  Continue home medications.  5. 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  6. Chronic hematuria with negative system in August 2009.  Outpatient      urology followup ongoing.  7. Prostate cancer for seed implant initially scheduled on September 17, 2008, for 2-week delay to allow recovery time from this acute      illness, rescheduled pending with Dr. Laverle Patter.  8. Hyperglycemia, steroid-induced this hospitalization.  9. Vocal cord dysfunction.  Continue H2 blocker reflux control until      pulmonary symptoms resolved.   DISCHARGE MEDICATIONS:  1. Avelox 400 mg p.o. daily x7 additional days.  2. Prednisone taper 10 mg tablets over 6 days as directed until done.  3. Albuterol MDI 2 puffs q. 4 h. p.r.n. shortness of breath.  4. Zantac 75 mg p.o. b.i.d. x7 days then p.r.n. reflux.  5. Mucinex 600 mg p.o. b.i.d. x7 days then p.r.n. cough.   OTHER MEDICATIONS ARE AS PRIOR TO ADMISSION:  1. Cozaar 100 mg once daily.  2. HCTZ 25 mg once daily.  3. Lexapro 10 mg once daily.  4. __________ 50 mg 1/2-1 tablet p.o. p.r.n..   DISPOSITION:  The patient is discharged home in medically stable and  improved  condition.  Hospital followup will be with primary care  physician Dr. Cato Mulligan on September 19, 2008, or as previously scheduled as  well as urologist Dr. Laverle Patter for rescheduling of seed implant for  prostate cancer at patient discretion.   HOSPITAL COURSE BY PROBLEM:  1. Pneumonia with nausea, vomiting.  The patient is a pleasant 70-year-      old gentleman with multiple chronic medical issues, which have been      well controlled, who saw primary care physician's office day of      admission due to GI symptoms.  He initially had small amounts of      diarrhea which proceeded quickly and to intractable nausea and      vomiting with projectile emesis.  This was also exacerbated by      severe cough with shortness of breath and wheeze and due to the      unrelenting nature of the symptoms for 3 days prior to admission.  He was referred to the hospital for further treatment.  A KUB was      checked to rule out obstruction and this fortunately was negative.      He was treated with IV fluids for associated dehydration and a      chest x-ray was performed, which suggested a faint left infrahilar      infiltrate and he was subsequently treated for presumed pneumonia      with the initiation of antibiotics at the time of admission.  The      patient's nausea, vomiting symptoms quickly resolved with control      of his cough and there was no further diarrhea during this      hospitalization, questioning possible viral gastroenteritis      trigger.  Majority of this hospitalization surround around      treatment of his pulmonary disease including the wheezing and      shortness of breath.  He was begun on steroids the second day of      hospitalization as well as p.r.n. nebs, scheduled antitussives and      proton pump inhibitor for control of vocal cord dysfunction      associated with his pseudo wheeze.  The patient has done well on      this regimen with improvement of pulmonary symptoms, and  thus we      will continue same on a tapering dose as described above on an      outpatient basis.  His O2 sat has been stable 95% on room air at      the time of discharge and he has been afebrile with gradual and      steady improvement demonstrated this hospitalization.  Of note, the      patient did have significant hyperglycemia with steroid-induced      diabetes during this hospitalization, but no previous history of      known diabetes prior to this time.  An A1c is drawn and pending at      time of dictation, and the patient has been instructed on his risk      for development of diabetes in the future, and advised to watch      carb intake in the future.  The patient is then clear to return to      work later this week on September 05, 2008, and a note has been      provided.  We have also requested rescheduling an delay of the      patient's seed implant for his prostate cancer with Dr. Vevelyn Royals      office by approximately 2 weeks from the previously scheduled time.      Urology Office will contact the patient regarding rescheduling this      appointment once the patient is more rehabilitated following this      acute illness.  At this time, the patient is felt to have reached      maximal benefit of acute hospitalization, is felt stable for      discharge home.  Greater than 30 minutes spent on day of discharge      coordination, planning, prescription, and patient education.      Valerie A. Felicity Coyer, MD  Electronically Signed     VAL/MEDQ  D:  09/01/2008  T:  09/02/2008  Job:  [id number]

## 2010-11-11 NOTE — Op Note (Signed)
NAME:  Wayne Booth, Wayne Booth NO.:  [id number]   MEDICAL RECORD NO.:  [id number]          PATIENT TYPE:  INP   LOCATION:  0006                         FACILITY:  [facility]   PHYSICIAN:  Madlyn Frankel. Charlann Boxer, M.D.  DATE OF BIRTH:  24-Feb-1941   DATE OF PROCEDURE:  12/17/2008  DATE OF DISCHARGE:                               OPERATIVE REPORT   PREOPERATIVE DIAGNOSIS:  Right knee osteoarthritis.   POSTOPERATIVE DIAGNOSIS:  Right knee osteoarthritis.   PROCEDURE:  Right total knee replacement utilizing DePuy rotating  platform posterior stabilized knee system with a size 4 femur, 4 tibia,  10-mm insert and a 41 patella button.   ASSISTANT:  Madlyn Frankel. Charlann Boxer, M.D.   ASSISTANT:  Yetta Glassman. Mann, P.A.-C.   ANESTHESIA:  Spinal.   FINDINGS:  None.   SPECIMENS:  None.   DRAINS:  One Hemovac.   TOURNIQUET TIME:  47 minutes at 250 mmHg.   COMPLICATIONS:  None.  The patient was stable to recovery room.   INDICATIONS FOR PROCEDURE:  Mr. Vizcarrondo is a patient of mine, 70 years  old, with a history of left total knee replacement.  He has had advanced  right knee arthroplasty and is failing conservative measures with  recurrent and painful effusions.  He at this point is ready to proceed  with his right total knee replacement.  Risks and benefits were reviewed  including the postoperative course expectations.  He had done very well  with his left knee and anticipating that he will have the same result  with hard work in the postoperative course.  Consent was obtained after  reviewing the risks of infection, DVT, component failure, need for  revision as well as potential surgery intervention for stiffness.   PROCEDURE IN DETAIL:  The patient was brought to the operative theater.  Once adequate anesthesia, preoperative antibiotics, Ancef, administered,  the patient was positioned supine, and a right thigh tourniquet was  placed.  The right lower extremity was then prescrubbed, prepped  and  draped in sterile fashion.   The foot was placed into the [facility] foot holder.  Time out was  performed identifying the patient, planned procedure and extremity.   The leg was exsanguinated.  Tourniquet elevated to 250 mmHg.  Midline  incision was made.  Following initial debridement, attention was  directed to the patella. Pre-cut measurement was approximately 25 mm.  I  resected down to about 14 mm and then used a 41 patella button which fit  the cut surface best.  I did release some of the tissue off of the  lateral facet and debrided this to prevent concern for impingement.   Attention was now directed to the femur.  Femoral canal was opened with  a drill, irrigated to prevent fat emboli.  Intramedullary rod was  passed, and at 5 degrees of valgus, I resected 11 mm off of the distal  femur due to the fact that he had a 5-degree flexion contracture.   Attention was now directed to the tibia.  The tibia was subluxated  anterior following releasing the medial fibers  due to its varus flexed  contracture.   With an extramedullary guide, I resected a 10-mm bone off of the  proximal lateral tibia.   I then checked with an extension block, and the knee came out to full  extension with a 10-mm insert in place.  Pins were removed.   Attention was redirected back to the femur where I sized the femur to be  a size 4.  I then checked the cut surface of the proximal tibia to make  sure it was perpendicular in the medial and lateral planes.  I then set  the rotation in the femoral component based off of the proximal cut of  the tibia using a C clamp and the rotation block.  Once this was pinned  into position, it was changed out for the 4-in-1 cutting block, actually  confirming there was going to be no notching.  Anterior, posterior, and  chamfer cuts were all made without difficulty or complication.  Final  box was then made off of the lateral aspect of the distal femur.  Attention  was redirected back to the tibia with the tibia subluxating  anteriorly. A size 4 tibial tray fit best in the cut surface.  It was  pinned into position and then drilled and keel punched.  Trial reduction  was now carried out with a 4 femur, 4 tibia, and a 10-mm insert.  The  knee came to full extension and was stable in extension and flexion.   The patella tracked through the trochlea without application of  pressure.  Given these parameters, all trial components were removed.  Final components were opened on the back table including the  polyethylene insert.  The synovial capsule junction was injected with  0.25% Marcaine with epinephrine and 1 mL of Toradol for a total of 61  mL.  The knee was irrigated with normal saline solution pulse lavage and  cement mixed.   Final components were cemented into position, and the 10-mm trial insert  was placed and the knee brought into extension.  Extruded cement was  removed.  Once the remaining cement had been removed from the knee, the  final 10-mm insert to match the size 4 femur was inserted.  We  reirrigated the knee as the tourniquet was let down.  There was no  significant hemostasis required.  A medium Hemovac drain was placed deep  into the lateral gutter.  Extension mechanism was then reapproximated in  the knee in 90 degrees of flexion.  The remainder of the wound was  closed with 2-0 Vicryl and running 4-0 Monocryl.  The knee was cleaned,  dried, and dressed sterilely with Steri-Strips and a bulky sterile wrap.  He was then brought to the recovery room in stable condition, tolerating  the procedure well.      Madlyn Frankel Charlann Boxer, M.D.  Electronically Signed     MDO/MEDQ  D:  12/17/2008  T:  12/17/2008  Job:  [id number]

## 2010-11-11 NOTE — Discharge Summary (Signed)
NAME:  Wayne Booth, Wayne Booth NO.:  [id number]   MEDICAL RECORD NO.:  [id number]          PATIENT TYPE:  INP   LOCATION:  1605                         FACILITY:  [facility]   PHYSICIAN:  Madlyn Frankel. Charlann Boxer, M.D.  DATE OF BIRTH:  [birth year]   DATE OF ADMISSION:  12/17/2008  DATE OF DISCHARGE:  12/19/2008                               DISCHARGE SUMMARY   ADMITTING DIAGNOSES:  1. Osteoarthritis.  2. Sleep apnea.  3. Hypertension.  4. Prostate cancer.   DISCHARGE DIAGNOSES:  1. Osteoarthritis.  2. Sleep apnea.  3. Hypertension.  4. Prostate cancer.   HISTORY OF PRESENT ILLNESS:  Wayne Booth is a 70 year old male with a  history of right knee pain secondary to osteoarthritis.  He does have a  history of a left total knee replacement and has done well with this.   CONSULTS:  None.   PROCEDURE:  Right total knee replacement by surgeon, Dr. Durene Romans.  Assistant, Dwyane Luo, P.A.-C.   LABORATORY DATA:  CBC, final reading, white blood cell 13.1, hematocrit  35, hemoglobin 11.9, platelets 188.  Metabolic, sodium 045, potassium  4.4, BUN 13, creatinine 1.4, glucose 105.   HOSPITAL COURSE:  Patient underwent a right total knee replacement and  admitted to the orthopedic floor.  His stay was unremarkable.  He  remained hemodynamically and orthopedically stable throughout his course  of stay.  Dressing was changed on day 2 with no significant drainage  from the wound.  He remained neurovascularly intact with his right lower  extremity with excellent range of motion and excellent strength prior to  discharge.  We saw him; he was doing great and was ready for discharge  on day 2.   DISPOSITION:  Discharged home in stable and improved condition with home  health care, PT.   DISCHARGE PHYSICAL THERAPY:  Send him home with a CPM machine, as well  as a JAS splint for home use.   DISCHARGE DIET:  Heart healthy.   DISCHARGE WOUND CARE:  Keep dry.   DISCHARGE MEDICATIONS:  1.  Aspirin 325 mg one p.o. b.i.d. x6 weeks.  2. Robaxin 500 mg on p.o. q.6 to 8 p.r.n. muscle spasm pain.  3. Norco 7.5/325 one to two p.o. q.4 to 6 p.r.n. pain.  4. Celebrex 200 mg one p.o. b.i.d. x2 weeks.  5. Cozaar 100 mg p.o. q.a.m.  6. Hydrochlorothiazide 25 mg one p.o. q.a.m.  7. Lexapro 10 mg one p.o. q.a.m.  8. Metamucil daily p.r.n.   DISCHARGE PHYSICAL THERAPY:  Goals of physical therapy 0 to 120 at 6  weeks.     ______________________________  Yetta Glassman [address], [place]      Madlyn Frankel. Charlann Boxer, M.D.  Electronically Signed    BLM/MEDQ  D:  01/01/2009  T:  01/01/2009  Job:  [id number]

## 2010-11-11 NOTE — Op Note (Signed)
NAME:  Wayne Booth, Wayne Booth NO.:  [id number]   MEDICAL RECORD NO.:  [id number]          PATIENT TYPE:  INP   LOCATION:  1421                         FACILITY:  [facility]   PHYSICIAN:  Heloise Purpura, MD      DATE OF BIRTH:  07/20/1940   DATE OF PROCEDURE:  11/30/2007  DATE OF DISCHARGE:                               OPERATIVE REPORT   PREOPERATIVE DIAGNOSIS:  Left renal mass.   POSTOPERATIVE DIAGNOSIS:  Left renal mass.   PROCEDURE:  Attempted left robotic-assisted laparoscopic partial  nephrectomy converted to left laparoscopic radical nephrectomy.   SURGEON:  Heloise Purpura, MD   ASSISTANT:  Lucrezia Starch. Earlene Plater, MD   ANESTHESIA:  General.   COMPLICATIONS:  Left ureteral/renal pelvic injury.  This was initially  repaired with a ureteral stent placed prior to the decision to perform a  radical nephrectomy for oncologic reasons.   ESTIMATED BLOOD LOSS:  500 mL.   INTRAVENOUS FLUIDS:  3500 cc of lactated Ringer's.   SPECIMENS:  1. The tumor margin.  2. Tumor margin #2.  3. Left renal mass.  4. Left kidney and proximal ureter.   DISPOSITION OF SPECIMENS:  To pathology.   INTRAOPERATIVE FINDINGS:  Two margins from the renal tumor base were  excised and sent for frozen section analysis.  The first tumor margin  returned negative for malignancy.  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   INDICATIONS:  Wayne Booth is a 70 year old gentleman with an incidentally-  detected left renal mass.  His mass was found to be enhancing and  concerning for a renal malignancy.  There were no signs for metastatic  disease based on his preoperative workup.  Of note, he had undergone  multiple left renal surgeries in the past due to a left pyelolithotomy  and subsequent development of a severe infectious complication requiring  a long hospitalization and reoperation as well as intravenous antibiotic  therapy.  After a discussion regarding the patient's  radiologic  findings, he did wish to proceed with surgical therapy.  We discussed  performing a partial nephrectomy if possible and discussed that we could  attempt to perform this in a minimally-invasive fashion, although he was  at high risk for open surgical conversion based on his prior surgeries.  The potential risks, complications and alternative options associated  with this procedure were discussed in detail with the patient and  informed consent was obtained.   DESCRIPTION OF PROCEDURE:  The patient was taken to the operating room  and a general anesthetic was administered.  Preoperative antibiotics  were given, he was placed in the left modified flank position with care  to pad all potential pressure points.  His abdomen was prepped and  draped in the usual sterile fashion.  A preoperative time-out was  performed.  Next a site was selected above the level of the umbilicus  and toward the edge of the left lateral rectus muscle for placement of  the camera port.  This was placed using an open Hasson technique.  This  was placed laterally based on the patient's  obesity.  This allowed entry  into the peritoneal cavity under direct vision without difficulty.  A 12-  mm port was then placed and a pneumoperitoneum was established.  The 0-  degree lens was used to inspect the abdomen and there was no evidence  for any intra-abdominal injuries or other abnormalities.  There were  noted to be some adhesions between the omentum and the lateral abdominal  wall, although no bowel adhesions were readily noted.  The remaining  ports were then placed and it was decided to begin the procedure with a  robotic-assisted laparoscopic technique.  Eight-millimeter ports were  placed in the left upper quadrant and left lower quadrant as well as in  the lateral lower left abdominal quadrant.  A 12-mm port was placed  medially and approximately midway between the camera port and the left  upper quadrant  port.  All ports were placed under direct vision and  without difficulty.  The surgical cart was then docked.  The white line  of Toldt was then incised along the length of the descending colon  allowing the colon to be mobilized medially and the mesocolon to be  reflected off the anterior layer of Gerota fascia.  This dissection  actually proved to be fairly routine without significant adhesions  noted.  Once the kidney and retroperitoneum were exposed, attention  turned to identification of the renal hilum.  Although the ureter was  not readily identifiable, the patient's gonadal vein was seen and this  was followed up to the main renal vein, which was identified and  isolated.  The patient was noted to have single renal vessels on  preoperative imaging and posterior to the renal vein, the renal artery  was identified.  There were noted to be small lumbar branches from the  renal vein, which were ligated with 5-mm Hem-o-lok clips to allow  exposure and isolation of the renal artery.  Once the renal vein and  renal artery were isolated sufficiently to allow subsequent clamping,  attention returned to the kidney.  The patient's renal mass was noted  off the lower pole of the kidney, and the kidney was further mobilized.  The kidney was noted to be densely adherent posteriorly and laterally  from his prior procedures.  During dissection of the lateral and  inferior aspect of the kidney, there was noted to be an incision into  the renal pelvis/ureteropelvic junction.  The ureter was noted to be  aberrantly located and extremely lateral to its normal expected  position, a likely result from the patient's prior surgeries.  It was  decided to repair this at this time.  This opening was extended slightly  in a longitudinal fashion along the proximal ureter for a distance of  approximately 2 cm.  A 0.038 guidewire with a sensor tip was then  inserted into the ureter distally and down into the  bladder.  A 6 x 26  double-J ureteral stent was then advanced over the wire and the wire was  removed.  The proximal curl was then positioned in the renal pelvis.  The ureter and renal pelvis were then repaired with a running 4-0 Vicryl  suture.  At this point it was decided to try to avoid as much inferior  and posterior dissection of the kidney as possible based on the location  of the renal pelvis and ureter.  Gerota fascia was entered and the  patient's renal mass was identified.  Mannitol 12.5 g intravenous had  been previously administered and the renal artery was now clamped with a  laparoscopic bulldog clamp.  The patient's tumor was then excised with  the cold scissors dissection.  The dissection was carried into what  appeared to be renal sinus fat with visible entries into the collecting  system.  No obvious visible tumor was seen.  Based on the abnormal  appearance of the patient's renal mass, however, it was decided to send  frozen section margins from the tumor base.  Two separate margins were  sent from the deep areas of resection.  While awaiting the pathology to  return, the patient's renal defect was repaired with 2-0 Vicryl sutures  used to close the collecting system and to oversew obvious arcuate renal  vessels.  FloSeal was then placed in the defect and a Surgicel bolster  was secured with 2-0 Monocryl sutures which were placed through the  renal capsule and secured.  The patient's renal artery was then  unclamped.  Total warm ischemia was 32 minutes.  There was noted be a  small amount of oozing from the inferior aspect of the kidney, which was  controlled with a combination of argon beam coagulation and an  additional Surgicel pledget that was placed on the kidney to provide  further renal compression.  This resulted in excellent hemostasis.  Preparations were then made for closure.  At this time, we received the  results from the deep margins of the resection.   While one margin was  clear, the other margin did demonstrate findings that clearly  demonstrated an oncocytic neoplasm.  At this point the patient's entire  renal tumor was then removed from his abdomen and grossly inspected.  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  Based on  the concern for cancer control, the decision was made to perform a  radical nephrectomy at this point.  The procedure was therefore  converted to a straight laparoscopic procedure as the surgical robot had  already been undocked.  The Harmonic scalpel was used to help create the  posterior plane between the kidney and the psoas muscle.  This did  require some entry into the psoas fascia and muscle due to the dense  adhesions.  The patient's gonadal vein was identified and ligated with  Hem-o-lok clips.  The patient's ureter was also divided and the  patient's ureteral stent was removed.  The ureter was then ligated also  with a Hem-o-lok clip.  The patient's renal artery was identified and a  single Hem-o-lok clip was able to be placed on it.  This resulted in  flattening of the renal vein, which was then isolated and subsequently  divided with a 45-mm flex ETS stapler.  The adrenal vein was noted to be  entering the renal vein lateral to where the renal vein was divided and  therefore Hem-o-lok clips were placed on the adrenal vein and it was  divided.  Gerota fascia was intentionally entered between the kidney and  the adrenal gland, preserving the adrenal gland.  The remaining  posterior, lateral and superior attachments to the kidney were then  taken down with the Harmonic scalpel until the kidney specimen was freed  completely.  It was then placed into an EndoCatch II bag for extraction.  It should be noted that the patient's prior renal mass had also been  placed into an extraction bag to avoid  tumor spillage.  The  renal fossa  was then carefully examined and hemostasis appeared excellent.  The  renal fossa was copiously irrigated and preparations were made for  closure.  The patient's 12-mm port sites were closed with 0 Vicryl  sutures either placed in a running fashion externally or with the [employer] needle internally.  The remaining ports were removed under  direct vision.  The patient's kidney specimen was then removed intact  within the EndoCatch II bag from one of the port sites, which was  extended on the lateral aspect of the abdomen.  This incision was then  closed in two layers with #1 PDS sutures.  All port sites were injected  with 0.25% Marcaine and reapproximated at the skin level with staples.  Sterile dressings were applied.  The patient appeared to tolerate the  procedure well and without complications.  He was able to be extubated  and transferred to the recovery unit in satisfactory condition.      Heloise Purpura, MD  Electronically Signed     LB/MEDQ  D:  11/30/2007  T:  12/01/2007  Job:  [id number]

## 2010-11-11 NOTE — Discharge Summary (Signed)
NAME:  Wayne Booth, Wayne Booth NO.:  [id number]   MEDICAL RECORD NO.:  [id number]          PATIENT TYPE:  INP   LOCATION:  1421                         [facility]   PHYSICIAN:  Heloise Purpura, MD      DATE OF BIRTH:  11/16/1940   DATE OF ADMISSION:  11/30/2007  DATE OF DISCHARGE:  12/05/2007                               DISCHARGE SUMMARY   ADMISSION DIAGNOSIS:  Left renal mass.   DISCHARGE DIAGNOSIS:  Renal cell carcinoma.   HISTORY AND PHYSICAL:  For full details, please see admission history  and physical.  Briefly, [name] is a 70 year old gentleman who was  found to have an enhancing 2.5-cm left lower pole renal mass on an  evaluation for gross hematuria.  His urologic history was somewhat  complicated and involved 2 prior left flank operations in the 1970s for  a pyelolithotomy and subsequent renal infection.  After discussing  management options for his renal mass, he elected to proceed with an  attempted partial nephrectomy.   HOSPITAL COURSE:  On November 30, 2007, the patient was taken to the  operating room and underwent an attempted robotic-assisted laparoscopic  left partial nephrectomy.  Intraoperatively, his tumor was able to be  isolated and was resected.  Intraoperative frozen sections revealed  oncocytic tumor at the renal margin.  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  A decision was therefore  made to perform a radical nephrectomy and the remainder of the kidney  was then removed laparoscopically.  Postoperatively, the patient was  able to be transferred to a regular hospital room following recovery  from anesthesia.  He does have a history of osteoarthritis of his knees  bilaterally and had been having significant pain of his right knee.  On  postoperative day #1, a physical therapy consultation was obtained and I  did speak with Dr.  Durene Romans regarding options.  The patient did  undergo a cortisone injection approximately 3 weeks prior and it was  felt that he could not be reinjected at this point.  It was therefore  recommended that he continue to ambulate as tolerated with pain  management for help with his knee pain.  Physical Therapy did work with  the patient and felt that he would be able to be discharged home and did  not think that he would need home physical therapy.  The patient's diet  was gradually advanced over the course the next few days as bowel  function did return.  He was able to begin ambulating better over the  first 2-3 days after surgery.   His serum creatinine was closely followed and was noted to be increasing  postoperatively.  On December 03, 2007, it reached 1.91.  The patient had  been prophylactically receiving fluid hydration along with  alkalinization of his urine to prophylactically treat any possible  rhabdomyolysis due to the long extent of his procedure and his large  muscle mass.  A urine myoglobin was sent,  but did not yet return during  his hospitalization.  Regardless, he was treated for possible  rhabdomyolysis and his creatinine followed.  On December 04, 2007, the  patient's creatinine began improving to 1.70 and on December 05, 2007, his  creatinine had improved to 1.45.  At this point, he was tolerating a  regular diet and ambulating without difficulty.  His pain was controlled  with oral pain medication and he was felt stable for discharge.   DISPOSITION:  Home.   DISCHARGE MEDICATIONS:  The patient was instructed to resume his regular  home medications excepting any aspirin, nonsteroidal anti-inflammatory  drugs, or herbal supplements.  He was told that he should use Percocet  as needed for pain and Colace as a stool softener and prescriptions were  administered for these medications.   DISCHARGE INSTRUCTIONS:  He was instructed to be ambulatory, but  specifically told to  refrain from any heavy lifting, strenuous activity,  or driving.  He was instructed on appropriate wound care.   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  This diagnosis as well as its implications and  his prognosis were discussed with the patient during his  hospitalization.   FOLLOWUP:  [name] is scheduled to follow up in approximately 10 days  for further postoperative evaluation, removal of the staples, and to  recheck his renal function.      Heloise Purpura, MD  Electronically Signed     LB/MEDQ  D:  12/05/2007  T:  12/05/2007  Job:  [id number]

## 2010-11-11 NOTE — H&P (Signed)
NAME:  Wayne Booth, Wayne Booth NO.:  [id number]   MEDICAL RECORD NO.:  [id number]          PATIENT TYPE:  INP   LOCATION:                               FACILITY:  [facility]   PHYSICIAN:  Madlyn Frankel. Charlann Boxer, M.D.  DATE OF BIRTH:  05/27/1941   DATE OF ADMISSION:  12/17/2008  DATE OF DISCHARGE:                              HISTORY & PHYSICAL   ADMITTING DIAGNOSIS:  Advanced  right knee osteoarthritis scheduled for  right total knee replacement.   SECONDARY DIAGNOSES:  1. History of sleep apnea.  2. History of hypertension.  3. History of prostate cancer.   BRIEF HISTORY:  Mr. Nicotra is a 70 year old male to be admitted for  surgery on December 17, 2008 for same day right total knee replacement.  He  has been a patient of nine for some time with a history of left total  knee replacement and he has done very well but at this point has failed  to manage his right knee osteoarthritis with any conservative measures  and he is ready to proceed for joint replacement.  We had tried multiple  attempts at injections, management with anti-inflammatories and other  medications.  He has been dealing with some other medical issues in the  interim since his left knee.   PAST MEDICAL HISTORY:  1. Includes sleep apnea.  2. Hypertension.  3. History of diagnosis of renal and prostate cancers for which he is      being currently followed and managed.   PAST SURGICAL HISTORY:  Includes a left total knee replacement, past  partial nephrectomy.   FAMILY MEDICAL ISSUES:  Noncontributory.   SOCIAL HISTORY:  He is a retired and enjoys spending time with his wife,  enjoys golfing.  Denies smoking, has occasional alcohol.   CURRENT MEDICATIONS:  Include Hyzaar, Lexapro, Vicodin as needed,  Robaxin.   DRUG ALLERGIES:  None listed.   REVIEW OF SYSTEMS:  Otherwise unremarkable for any upper respiratory,  pulmonary, cardiac, gastrointestinal, genitourinary issues over the past  2 weeks prior to  admission.   EXAMINATION:  Finds him awake, alert and oriented in the office with a  pulse of 68, respirations 18 and blood pressure 122/84.  GENERAL:  He is awake, alert and oriented.  No evidence of slurred  speech, ocular dysfunction.  CHEST:  Clear to auscultation.  HEART:  Has a regular rate and rhythm.  ABDOMEN:  Soft and nontender, a bit rotund.  He is neurovascularly stable in both lower extremities.  Left knee exam  reveals a well-healed surgical incision with near full extension and  excellent flexion with stable ligaments.  Right knee has painful  anterior medial crepitation with range of motion pain to palpation.   LABORATORIES:  Labs and EKGs are all being performed in his [facility]  evaluation.  Radiographs of the knee reveal end-stage right knee  osteoarthritis.   ASSESSMENT:  Advanced right knee osteoarthritis.   PLAN:  1. He is to be admitted for same day surgery on December 17, 2008 for      right total knee replacement.  Risks and benefits have been      discussed at length.  Consent will be obtained based on our      discussion today.  2. He will plan to go to home with home health physical therapy with      Genevieve Norlander as he had in the past.  He also had a home health CPM for      his left knee ad he wishes to have the same recovery as he had on      his left knee, so we will get that arranged.  3. Otherwise medications will be utilized in the perioperative period      and prescribed at that time.  At this point standard medications      for bowel program in addition to his deep vein thrombosis      prophylaxis were provided in the office today.      Madlyn Frankel Charlann Boxer, M.D.  Electronically Signed     MDO/MEDQ  D:  12/09/2008  T:  12/09/2008  Job:  [id number]

## 2010-11-11 NOTE — Op Note (Signed)
NAME:  Wayne Booth, Wayne Booth NO.:  [id number]   MEDICAL RECORD NO.:  [id number]          PATIENT TYPE:  AMB   LOCATION:  [facility]                         FACILITY:  [facility]   PHYSICIAN:  Heloise Purpura, MD      DATE OF BIRTH:  [birth year]   DATE OF PROCEDURE:  10/08/2008  DATE OF DISCHARGE:                               OPERATIVE REPORT   PREOPERATIVE DIAGNOSIS:  Clinically localized adenocarcinoma of the  prostate.   POSTOPERATIVE DIAGNOSIS:  Clinically localized adenocarcinoma of the  prostate.   PROCEDURE:  1. Cystoscopy.  2. Prostate brachytherapy.   SURGEON:  Heloise Purpura, M.D.   RADIATION ONCOLOGIST:  Artist Pais. Kathrynn Running, M.D.   ANESTHESIA:  General.   COMPLICATIONS:  None.   INDICATIONS:  [name] is a 70 year old gentleman with clinically  localized adenocarcinoma of the prostate.  After a discussion regarding  management options for treatment, he elected to proceed with the above  procedure.  The potential risks, complications, and alternative options  were discussed in detail and informed consent was obtained.   DESCRIPTION OF PROCEDURE:  The patient was taken to the operating room  and a general anesthetic was administered.  Under transrectal ultrasound  guidance, he then underwent preoperative planning for radiation seed  implantation into the prostate under the care Dr. Kathrynn Running.  Once a  preoperative planning was complete, a transperineal template was placed  and used to guide seed placement into the prostate with the aid of the  robotic Nucletron device.  The total treatment dose was 145 cGy.  A  total of 73 seeds with 22 needles were placed into the prostate.  There  were no complications during placement of the needles or seeds.  After  all seeds were placed according to the preoperative plan, flexible  cystourethroscopy was performed which demonstrated no seeds within the  bladder or urethra.  Inspection of the bladder revealed no evidence of  any bladder tumors, stones, or other mucosal pathology.  A 16-French  Foley catheter was then reinserted into the bladder.  The patient  tolerated the procedure well without complications.  He was able to be  extubated and transferred to recovery unit in satisfactory condition.     Heloise Purpura, MD  Electronically Signed    LB/MEDQ  D:  10/08/2008  T:  10/09/2008  Job:  [id number]

## 2010-11-11 NOTE — Assessment & Plan Note (Signed)
[facility]                                 ON-CALL NOTE   DELOS, KLICH                        MRN:          [id number]  DATE:09/25/2007                            DOB:          December 11, 1940    He called from [phone] at 2:23 P.M. on September 25, 2007.  This is a  patient of Dr. Cato Mulligan.  He called saying that Dr. Cato Mulligan had a CAT scan  scheduled and he was told to start taking contrast tomorrow morning but  nobody ever gave him contrast, and he did not get the message until  after the office was closed on Friday, so he is wondering where he is  supposed to get it.  I informed they were probably hoping he could pick  it up Friday afternoon, that he should call the office first thing in  the morning to let them know he does not have the contrast, so they can  move the CAT scan if necessary.  He has it scheduled at [facility] for late tomorrow morning but he is supposed to start the contrast  at 7:30 in the morning.     Lelon Perla, DO  Electronically Signed    Shawnie Dapper  DD: 09/25/2007  DT: 09/25/2007  Job #: [id number]   cc:   Valetta Mole. Swords, MD

## 2010-11-11 NOTE — H&P (Signed)
NAME:  Wayne Booth, Wayne Booth NO.:  [id number]   MEDICAL RECORD NO.:  [id number]          PATIENT TYPE:  INP   LOCATION:  1421                         FACILITY:  [facility]   PHYSICIAN:  Heloise Purpura, MD      DATE OF BIRTH:  1941-01-16   DATE OF ADMISSION:  11/30/2007  DATE OF DISCHARGE:                              HISTORY & PHYSICAL   CHIEF COMPLAINT:  Left renal mass.   HISTORY:  Wayne Booth is a 70 year old gentleman who is found to have  gross hematuria and underwent a CT scan which demonstrated a 2.5 cm  enhancing, left lower pole renal mass, concerning for renal malignancy.  He underwent a metastatic evaluation which was negative.  He also  underwent cystoscopic evaluation, which did not reveal any lower urinary  tract source for his hematuria.  He does have a history of 2 separate  left open flank stone surgeries in the 1970s, with a complication of  infection at that time.  After a discussion regarding management options  for treatment, he elected to proceed with surgical therapy of his mass.   PAST MEDICAL HISTORY:  1. Sleep apnea.  2. Hypertension.  3. Nephrolithiasis.   PAST SURGICAL HISTORY:  1. Appendectomy.  2. Cholecystectomy.  3. Left knee replacement.  4. Left pyelolithotomy  5. Vasectomy.   MEDICATIONS:  1. Hydrochlorothiazide.  2. Hyzaar.  3. Lexapro.   ALLERGIES:  No known drug allergies.   FAMILY HISTORY:  There is a history of renal cell carcinoma with the  patient's sister.   SOCIAL HISTORY:  He is married.  He denies alcohol or tobacco use.   REVIEW OF SYSTEMS:  A complete review of systems was performed.  Pertinent positives include fatigue and pruritus and anxiety.  All other  systems are reviewed and otherwise negative.   PHYSICAL EXAMINATION:  CONSTITUTIONAL:  Well-nourished, well-developed,  age-appropriate male in no acute distress.  CARDIOVASCULAR:  Regular rate and rhythm, without obvious murmurs.  LUNGS:  Clear  bilaterally.  ABDOMEN:  The abdomen is obese, soft, nontender, without abdominal  masses.  EXTREMITIES:  No edema.   LABORATORY DATA:  Serum creatinine 0.9 with an estimated GFR of 90 mL  per minute.   IMPRESSION:  Left renal mass.   PLAN:  Wayne Booth will undergo an attempted, robotic-assisted left  laparoscopic partial nephrectomy with the possibility for an open  partial nephrectomy versus radical nephrectomy, depending on  intraoperative findings and history of multiple renal surgeries.      Heloise Purpura, MD  Electronically Signed     LB/MEDQ  D:  11/30/2007  T:  11/30/2007  Job:  [id number]

## 2010-11-14 NOTE — Cardiovascular Report (Signed)
NAME:  Wayne Booth, Wayne Booth NO.:  [id number]   MEDICAL RECORD NO.:  [id number]          PATIENT TYPE:  OIB   LOCATION:  6501                         FACILITY:  [facility]   PHYSICIAN:  Bevelyn Buckles. Bensimhon, MDDATE OF BIRTH:  04/14/1941   DATE OF PROCEDURE:  10/22/2006  DATE OF DISCHARGE:                            CARDIAC CATHETERIZATION   PRIMARY CARE PHYSICIAN:  Valetta Mole. Swords, M.D.   CARDIOLOGIST:  Jesse Sans. Wall, M.D., [facility].   ORTHOPEDIST:  Madlyn Frankel. Charlann Boxer, M.D.   PATIENT IDENTIFICATION:  Wayne Booth is a delightful 70 year old male  with a history of hypertension and obesity.  He is been worked up for  possible knee replacement.  Prior to his surgery, he underwent screening  Myoview to rule out significant underlying coronary disease.  This  showed a mild reversible defect in the high anterior wall.  He is thus  referred for cardiac catheterization.  This was performed in the  outpatient catheterization lab.   PROCEDURES PERFORMED:  1. Selective coronary angiography.  2. Left heart catheterization.  3. Left ventriculogram.   DESCRIPTION OF PROCEDURE:  The risks and benefits of catheterization  were explained.  Consent was signed and placed on the chart.  A 4-French  sheath was placed in the right femoral artery using a modified Seldinger  technique.  Standard catheters including a JL-4, 3-D RCA, and angled  pigtail were used for the case. All catheter exchanges were made over a  wire.  There were no apparent complications.   HEMODYNAMICS:  Central aortic pressure 132/82, with a mean of 104.  LV  pressure was 122/8, with an EDP of 15.  There was no aortic stenosis on  pullback.   Left main:  Was a large vessel, angiographically normal.   Left circumflex was a large vessel that was dominant.  It gave off a  large ramus, a moderate-to-large posterolateral and PDA.  It was  angiographically normal.   LAD was a large vessel, wrapping the apex.  It gave off a  moderate-size  high first diagonal.  The system was angiographically normal.   Right coronary artery was a small nondominant system, with no  significant stenoses.   Left ventriculogram done in the RAO position showed an EF of 70%, with  no wall motion abnormalities.   ASSESSMENT:  1. Normal coronary arteries.  2. Normal left ventricular function.   PLAN/DISCUSSION:  Wayne Booth does not having significant epicardial  coronary disease.  Thus, he can proceed with surgery without any further  cardiac workup.  He is at low risk for cardiac complications.  Would  continue risk factor modification.      Bevelyn Buckles. Bensimhon, MD     DRB/MEDQ  D:  10/22/2006  T:  10/22/2006  Job:  [id number]   cc:   Madlyn Frankel Charlann Boxer, M.D.  Fax to [facility]

## 2010-11-14 NOTE — Op Note (Signed)
NAME:  Wayne Booth, EICHEL NO.:  [id number]   MEDICAL RECORD NO.:  [id number]          PATIENT TYPE:  INP   LOCATION:  0001                         FACILITY:  [facility]   PHYSICIAN:  Madlyn Frankel. Charlann Boxer, M.D.  DATE OF BIRTH:  Mar 25, 1941   DATE OF PROCEDURE:  10/28/2006  DATE OF DISCHARGE:                               OPERATIVE REPORT   PREOPERATIVE DIAGNOSIS:  Left knee end-stage osteoarthritis.   POSTOPERATIVE DIAGNOSIS:  Left knee end-stage osteoarthritis.   PROCEDURE:  Left total knee replacement.   COMPONENTS USED:  DePuy rotating platform knee system size 4 femur, 4  tibia, 10 mm insert, 41 patellar button.   SURGEON:  Madlyn Frankel. Charlann Boxer, M.D.   ASSISTANT:  Dwyane Luo, PA-C   ANESTHESIA:  Duramorph spinal by Dr. [place] Rose.   BLOOD LOSS:  50 mL.   COMPLICATIONS:  None.   DRAINS:  None.   INDICATIONS FOR PROCEDURE:  Mr. Rambert is a 70 year old gentleman who  had been kindly referred for evaluation of left knee pain.  He underwent  conservative measures including injections, attempted arthroscopic  debridement with persistent recurring symptoms.  He subsequently  repeated trial of conservative measures including cortisone and viscus  supplementation.  None of this provided adequate or ample relief to his  knee.  He wished to proceed with knee replacement procedure.  We  discussed and reviewed the risks and benefits of the surgery including  infection, DVT and component failure, need for revision surgery as well  as a postoperative course and expectations and need to work  significantly on his range of motion prevent flexion contracture and get  maximal motion.  After reviewing these risks, consent was obtained.   PROCEDURE IN DETAIL:  The patient was brought to operative theater.  Once adequate anesthesia and preoperative antibiotics, 2 grams of Ancef,  were administered the patient was positioned supine on the operative  table.  Left thigh tourniquet was  placed, left lower extremity was then  pre scrubbed and prepped and draped in sterile fashion.   The midline incision was made followed by modified medial parapatellar  arthrotomy with patella subluxation laterally as opposed to eversion.  The patient noted have pretty tight musculature and joint capsule  tissue.   Following initial exposure, attention was first directed to the patella.  Precut thickness was about 25 mm.  I resected down to 15 mm and used a  41 patellar button, post resection was 26 mm.   I drilled the holes for the patella button and placed a metal shim to  protect the patella against the retractors for the remainder of case.  Further knee exposure obtained including meniscectomy and further  debridements the fat pad medially and laterally.   ACL, PCL were debrided.  At this point the femoral canal was opened  irrigated to prevent fat emboli.  I then placed intramedullary rod in 5  degrees of valgus, chose to resect 11 mm of bone off the distal femur  due to 5 degrees flexion contracture preop.  Then sized the femur to be  a size 4.  The  anterior-posterior chamfer cuts were then made without  difficulty.  I then cut the trochlear box cut off the lateral aspect of  distal femur.  Following this, the osteophytes were debrided medially  and laterally including undermined the MCL to further decompress it.   At this point attention was directed tibia and tractors placed into the  exposed with subluxation.  With the extramedullary guide in the  perpendicular position to the ankle, I cut 10 mm of bone off of the  lateral proximal tibia.  This included some debridement osteophytes on  the medial tibial plateau again decompressing the MCL.  I did perform a  medial proximal peel due to varus nature to his knee and again the tight  joint capsule.  This was carried down to about 2 cm on the medial aspect  of tibia.  Following tibial cut I checked with extension block to make   sure that the knee came out to full extension with 10 mm block.  It did,  the pins were removed.  I then went ahead and positioned the tibial tray  on the tibia to get the maximal bony contact and as well as correct  orientation.  I then pinned and drilled and keel punched the tibia in  location then performed a trial.  Trial femur was placed in the tibia as  well as a 10-mm insert.  The knee came out to full  extension but wanted  to spring back a little bit couple degrees of extension.  I think this  is due to his joint capsule tightness.  Nonetheless I was happy with the  component.  The patella tracked without application of pressure through  the center of the trochlea.   At this point all trial components removed the knee was copiously  irrigated normal saline solution.  I drilled the proximal medial tibia  due to some sclerotic bone present.   I then injected the knee with 60 mL 0.25% Marcaine with epinephrine.   Cement was mixed, final components were cemented in position and knee  was brought out to extension with a trial 10 insert in place.  The  extruded cement was removed.  Once the cement had cured, the knee was  brought back up to flexion.  Excessive cement was debrided off the  posterior aspect of the knee.  Once I was satisfied that I did not see  any debris of cement, final 10 mm insert was placed.   At this point I irrigated the knee out again and then placed 5 mL of  FloSeal into the medial lateral posterior aspect of the knee.  Good  hemostasis was required anteriorly as this large synovectomy was carried  out to debride the anterior suprapatellar synovial pouch.   Following this the knee was brought to flexion and the extensor  mechanism reapproximated using #1 Vicryl.  The remainder of wound was  closed with 2-0 Vicryl running 4-0 Monocryl.  The patient's knee was  cleaned, dried, dressed sterilely with Steri-Strips, dressing sponges and sterile bulky wrap and  placed a knee immobilizer.      Madlyn Frankel Charlann Boxer, M.D.  Electronically Signed     MDO/MEDQ  D:  10/28/2006  T:  10/28/2006  Job:  [id number]

## 2010-11-14 NOTE — H&P (Signed)
NAME:  Wayne Booth, Wayne Booth NO.:  [id number]   MEDICAL RECORD NO.:  [id number]          PATIENT TYPE:  OIB   LOCATION:  6501                         FACILITY:  [facility]   PHYSICIAN:  Madlyn Frankel. Charlann Boxer, M.D.  DATE OF BIRTH:  1941-04-17   DATE OF ADMISSION:  10/28/2006  DATE OF DISCHARGE:  10/22/2006                              HISTORY & PHYSICAL   PROCEDURE:  Left total knee replacement.   CHIEF COMPLAINT:  Left knee pain.   HISTORY OF PRESENT ILLNESS:  This is a 70 year old male with a history  persistent progressive knee pain.  He had does have a history of knee  arthroscopic debridement with minimal pain relief afterwards.  He has  been refractory to all other conservative treatment including oral anti-  inflammatories, injections and viscous supplementation.  He does have a  recent history of stress test and echo and a subsequent cardiac  catheterization which was a negative for any acute coronary event.   PAST MEDICAL HISTORY:  1. Osteoarthritis.  2. Hypertension.  3. Anxiety/depression.   PAST SURGICAL HISTORY:  1. Kidney stone in 1979.  2. Gallstone surgery 2001.  3. Arthroscopic surgery left knee 2007.   FAMILY HISTORY:  Noncontributory.   SOCIAL HISTORY:  The patient is married to spouse Augusto Gamble who will be the  primary caregiver after surgery.   DRUG ALLERGIES:  NO KNOWN DRUG ALLERGIES.   MEDICATIONS:  1. Lexapro 10 mg one p.o. daily.  2. Hyzaar 100/25 one p.o. daily.   REVIEW OF SYSTEMS:  None other than HPI.   PHYSICAL EXAMINATION:  VITAL SIGNS:  Pulse 76, respirations 18, blood  pressure 118/80.  GENERAL:  He is awake, alert and oriented, well-developed, well-  nourished, no acute distress.  NECK:  No carotid bruits.  Supple.  CHEST/LUNGS:  Clear to auscultation bilaterally.  BREASTS:  Deferred.  HEART:  Regular rate and rhythm without gallops, clicks, rubs or  murmurs.  ABDOMEN:  Soft, nontender, nondistended.  Obese.  GENITOURINARY:   Deferred.  EXTREMITIES:  Left lower extremity has near full extension to 110  degrees.  Has no ballottable effusion, well-healed portals signs or  prior arthroscopic surgery.  SKIN:  Dorsalis pedis pulses positive.  No signs of cellulitis.  NEUROLOGIC:  He has intact distal sensibilities.   LABORATORY DATA:  His labs are pending presurgical testing.   Cardiology EKG, stress and echo performed at [facility],  including nuclear study with stress test and subsequent cardiac  catheterization and is cleared.   IMPRESSION:  Left knee osteoarthritis.   PLAN OF ACTION:  A left total knee arthroplasty on Oct 28, 2006, at  [facility], surgeon Dr. Durene Romans.  Risks and complications  were discussed.  Questions were encouraged, answered and reviewed.   We did discuss preoperative use of Celebrex to help with pain and  inflammation.  He was instructed to use the Celebrex preoperatively as  of this protocol has been shown to reduce analgesic use after  postoperatively.     ______________________________  Yetta Glassman [address], [place]      Madlyn Frankel.  Charlann Boxer, M.D.  Electronically Signed    BLM/MEDQ  D:  10/25/2006  T:  10/26/2006  Job:  [id number]   cc:   Valetta Mole. Swords, MD  [address] [place]  [place] [address]   Bevelyn Buckles. Bensimhon, MD  [address] [place] [address]

## 2010-11-14 NOTE — Op Note (Signed)
[facility]  Patient:    Wayne Booth, Wayne Booth                      MRN: [id number] Proc. Date: 10/19/00 Adm. Date:  [id number] Attending:  Tempie Donning CC:         Lucrezia Starch. Ovidio Hanger, M.D.  Bruce Rexene Edison Swords, M.D. [facility]   Operative Report  PREOPERATIVE DIAGNOSIS:  Cholelithiasis, cholecystitis.  POSTOPERATIVE DIAGNOSIS: Cholelithiasis, cholecystitis.  OPERATION:  Laparoscopic cholecystectomy.  SURGEON:  Gita Kudo, M.D.  ASSISTANT:  Lorne Skeens. Hoxworth, M.D.  ANESTHESIA:  General endotracheal.  CLINICAL SUMMARY:  A 70 year old male significant renal surgical history of multiple stone, hypertensive, and presents with abdominal pain.  CT scan has shown multiple gallstones.  His liver function studies are normal.  OPERATIVE FINDINGS:  The patients gallbladder was not acutely inflamed. There were multiple medium size stones in it.  The cystic duct and artery were normal in size and anatomy.  DESCRIPTION OF PROCEDURE:  Under satisfactory general endotracheal anesthesia, having received 1.0 g Ancef preop, the patients abdomen was prepped and draped in a standard fashion.  A transverse incision was made above the umbilicus and the midline entered into the peritoneum.  This was controlled with a figure-of-eight 0 Vicryl suture, and a Hasson operating port inserted, secured, and used to get good CO2 pneumoperitoneum.  Camera was then placed and, under direct vision through Marcaine infiltrated sites, two #5 ports placed laterally and a second #10 medially.  With graspers through the lateral ports with excellent visualization, I carefully took the adhesions down in the fat at the gallbladder/cystic duct junction.  When we found the cystic duct and artery, they were dissected circumferentially with right-angle clamps and, when certain of the anatomy, controlled with multiple metal clips and divided between the distal two.  Then the gallbladder was  removed from below upward using the coagulating spatula and hook for hemostasis and dissection.  The gallbladder was severed from the liver bed.  The liver bed was checked for hemostasis by cautery and lavaged with saline and suctioned dry.  The camera was then moved to the upper port, and, through the umbilical port, a large grasper used to retrieve the gallbladder intact and without spillage. Because of the multiple stones and the smallish opening, the gallbladder was opened externally and stones removed to allow the gallbladder to be removed from the abdomen without any difficulty.  Then the operative site was again checked for hemostasis and suctioned dry after lavage with saline.   CO2 was released, ports removed under direct vision, and then the incision at the umbilicus closed with the previously placed figure-of-eight 0 Vicryl and a second interrupted 0 Vicryl.  This area was then infiltrated with Marcaine, and all subcutaneous tissue approximated with 4-0 Vicryl and Steri-Strips used to approximate the skin.  Sterile absorbent dressings were then applied, and the patient went to the recovery room from the operating room in good condition without complication. DD:  10/19/00 TD:  10/19/00 Job: [id number]

## 2010-11-14 NOTE — Discharge Summary (Signed)
NAME:  Wayne Booth, B[place] NO.:  [id number]   MEDICAL RECORD NO.:  [id number]          PATIENT TYPE:  INP   LOCATION:  1613                         FACILITY:  [facility]   PHYSICIAN:  Madlyn Frankel. Charlann Boxer, M.D.  DATE OF BIRTH:  1941/06/01   DATE OF ADMISSION:  10/28/2006  DATE OF DISCHARGE:  10/31/2006                               DISCHARGE SUMMARY   ADMISSION DIAGNOSES:  1. Osteoarthritis.  2. Hypertension.  3. Anxiety depression.   DISCHARGE DIAGNOSES:  1. 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  2. Hypertension.  3. Anxiety depression.   CONSULTATION:  None.   PROCEDURE:  Left total knee replacement with surgeon Dr. Durene Romans.  Assistant Dwyane Luo, PA-C.   HISTORY OF PRESENT ILLNESS:  Mr. Osoria is a 70 year old male with  persistent progressive left knee pain.  He has a history of arthroscopic  debridement which provided minimum pain relief.  He has been refractory  to all conservative treatments including anti-inflammatories, cortisone  injections, Viscus infiltration.  He had a recent cardiac  catheterization that was negative for acute coronary artery disease.   LABS:  Presurgical CBC; hematocrit 44.7.  Postop day #1 white cell 11.2,  hematocrit 37.2.  Postop day #2 12.6 white blood cell, 35.4 hematocrit.  Preadmission coagulation within normal limits.  Preadmission  chemistries; glucose 100, all other normal.  Postop day #1, glucose 134.  Postop day #2, glucose 113 stable.  GFR greater than 60.  GI: Within  normal limits.  UA negative except trace of leukocyte esterase.   Cardiology had a full cardiology workup including cardiac cath  beforehand and EKG showed a normal sinus rhythm.   HOSPITAL COURSE:  The patient underwent left total knee replacement and  tolerated procedure well.  He was admitted to orthopedic floor.  Pain  was well-controlled throughout his course stay.  He remained  neuromuscular vascularly intact throughout.  He was  able to do a  straight-leg raise on postop day #2 and carried forward.  We heplocked  his IV on the second day. Physical therapy did well walked over 200  feet.  By postop day #3 was doing extremely well with physical therapy.  Wound dressing been changed multiple times wound showed no active  drainage.  He is afebrile, progressing nicely with the use of a rolling  walker.   DISCHARGE DISPOSITION:  Stable, improved. Home health care PT and  Lovenox administration.   DISCHARGE PHYSICAL THERAPY:  Gait training, proprioception, minimize  pain, maximize strength, increase range of motion, encourage  independence in activities of daily living.   DISCHARGE WOUND CARE:  Keep wound dry, change dressing daily basis.   DISCHARGE DIET:  Regular per the patient tolerance.   DISCHARGE FOLLOWUP:  1. Dr. Charlann Boxer on May 4, at 3900  2 weeks wound check.  2. If develop acute shortness of breath, severe calf pain please call      emergency services immediately.   DISCHARGE MEDICATIONS:  1. Hyzaar 100/25 one p.o. q.a.m.  2. Lexapro 10 mg one p.o. q.a.m.  3. Vicodin 5/325 one to two p.o. q.4-6  p.r.n. pain.  4. Robaxin 500 mg one p.o. q.6 muscle spasm.  5. Colace 100 mg one p.o. b.i.d. constipation.  6. Iron 325 mg one p.o. t.i.d. x2 weeks.  7. Lovenox 30 mg subcu q.12 x11 days DVT prophylaxis.  8. Enteric-coated aspirin 325 mg one p.o. daily after her Lovenox      completed for 4 weeks for a total of 6 weeks DVT prophylaxis.     ______________________________  Yetta Glassman [address], [place]      Madlyn Frankel. Charlann Boxer, M.D.  Electronically Signed    BLM/MEDQ  D:  11/17/2006  T:  11/17/2006  Job:  [id number]

## 2010-11-14 NOTE — Assessment & Plan Note (Signed)
[facility]                            CARDIOLOGY OFFICE NOTE   GABREIL, YONKERS                      MRN:          [id number]  DATE:10/21/2006                            DOB:          Apr 11, 1941    CHIEF COMPLAINT:  I got a problem with blood flow to the top of my  heart.   HISTORY OF PRESENT ILLNESS:  [name] is a very pleasant 70-  year-old married white male, who has multiple risk factors for coronary  artery disease.  He is scheduled for total knee replacement on the left  by Dr. Durene Romans next Thursday.  Prior to this operation, Dr. Cato Mulligan  obtained a stress Myoview.  This demonstrated ejection fraction of 70%  with normal contractility and thickening of all areas in the myocardium;  however, there was a mild reversible defect in the high to mid anterior  wall suggestive of ischemia.  This was read by Dr. Nicholes Mango, and I  have looked at it as well and agree.   He is asymptomatic; however, he is very limited in getting around.   His risk factors include age, sex, obesity, hypertension.  There is no  premature coronary disease.  He says his lipids are always good.   PAST MEDICAL HISTORY:  No known drug allergies.   CURRENT MEDICATIONS:  1. Hyzaar 100/25 daily.  2. Lexapro 10 mg daily.  3. He does not take aspirin.   He does not smoke.  He does drink some alcohol.  He does not use  caffeine.   SURGERY:  1. Cholecystectomy 2005.  2. Nephrolithiasis surgery in 1978.  3. Arthroscopic surgery in the left knee in 2007.   FAMILY HISTORY:  As above.   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  He is married and has 2 children.  His wife is with him today.   REVIEW OF SYSTEMS:  Other than the HPI, positive for some allergies and  hay fever, chronic arthritis particularly of the left knee.  Anxiety and  depression on Lexapro.  The rest of the review of systems is negative.   PHYSICAL  EXAMINATION:  GENERAL:  He is a very pleasant gentleman in no  acute distress.  VITAL SIGNS:  His blood pressure was 125/86.  His pulse is 96 and  regular.  His weight is 259.  He is 5 feet 11 inches.  HEENT:  Normocephalic, atraumatic.  PERRLA.  Extraocular movements  intact.  Sclerae clear.  Facial asymmetry is normal.  Dentition is  satisfactory.  NECK:  No JVD.  Carotids are full without bruits.  There is no  thyromegaly.  Trachea is midline.  LUNGS:  Clear.  HEART:  Poorly appreciated PMI.  He has normal S1, S2 without murmurs,  rubs, or gallops.  ABDOMEN:  Protuberant with good bowel sounds.  Organomegaly could not be  adequately assessed.  EXTREMITIES:  No edema.  Pulses are intact.  NEUROLOGIC:  Intact.  SKIN:  Within normal limits.   Electrocardiogram is normal.   ASSESSMENT:  1. Abnormal stress Myoview suggesting high anterolateral ischemia,      normal left ventricular function and contractility with obstructive      coronary artery disease.  2. Multiple cardiac risk factors.  3. Hypertension.  4. Obesity.  5. Preop for left total knee next week.   I have a long discussion with he and his wife.  I have recommended  cardiac catheterization.  Indications, risks, potential benefits have  been discussed.  He is scheduled now for tomorrow, Friday morning with  Dr. Gala Romney at [facility].     Maisie Fus C. Daleen Squibb, MD, [facility]  Electronically Signed    TCW/MedQ  DD: 10/21/2006  DT: 10/21/2006  Job #: [id number]   cc:   Valetta Mole. Cato Mulligan, MD  Madlyn Frankel. Charlann Boxer, M.D.

## 2010-11-14 NOTE — Op Note (Signed)
NAME:  Wayne Booth, Wayne Booth NO.:  [id number]   MEDICAL RECORD NO.:  [id number]          PATIENT TYPE:  OIB   LOCATION:  1513                         FACILITY:  [facility]   PHYSICIAN:  Madlyn Frankel. Charlann Boxer, M.D.  DATE OF BIRTH:  Oct 21, 1940   DATE OF PROCEDURE:  03/11/2006  DATE OF DISCHARGE:  03/12/2006                                 OPERATIVE REPORT   PREOPERATIVE DIAGNOSIS:  Left knee degenerative joint disease associated  with degenerative meniscal tear by MRI.   POSTOPERATIVE DIAGNOSIS/FINDINGS:  1. A degenerative complex tear to the posterior horn, mid body junction of      medial meniscus.  2. Grade 3/4 changes to the trochlea extending to that medial femoral      condylar lesion with loose fragments of cartilage present.  3. Extensive synovitis, thickened and hypertrophied, anterior aspect of      the knee.  4. Several large loose bodies.   PROCEDURES:  1. Left knee diagnostic and operative arthroscopy with posterior medial      meniscectomy.  2. Extensive synovectomy.  3. Medial and posterior femoral condyloplasty.  4. Removal of multiple large fragments and cartilage, loose.   SURGEON:  Madlyn Frankel. Charlann Boxer, M.D.   ASSISTANT:  None.   ANESTHESIA:  General plus local.   COMPLICATIONS:  None.   INDICATIONS FOR PROCEDURE:  Wayne Booth is a very pleasant 70 year old  gentleman who had been seen for full evaluation of left knee pain.  He had  failed a course of conservative measures, including cortisone injection.  MRI revealed findings consistent with degenerative tear of the meniscus as  well as OA.  Radiographs have indicated nonpreservation of joint space.  I  had reviewed with him risks and benefits of treatment options, including  nonoperative intervention and the potential risks and benefits there, as  well as the risks and benefits of operative intervention in the form of  arthroscopy versus replacement surgery.  He chose to proceed with  arthroscopic  procedure.  Consent obtained.   PROCEDURE IN DETAIL:  Patient was brought to the operating theater.  Once  adequate anesthesia and preoperative antibiotics were administered, 1 gm of  Ancef, the patient was positioned supine with the left leg in a leg holder.  The left lower extremity was then prepped and draped in a sterile fashion.  A standard inferior medial, superior medial, inferolateral portals were  utilized.  Diagnostic evaluation of the knee was carried out, revealing the  above-noted findings.  The initial examination was somewhat difficult due to  the extensive synovitis present.  The first portion of the case involved an  extensive synovectomy over the anterior aspect of the knee.  This allowed  for better visualization, also revealing the significant trochlear defect.  Condyloplasty in the medial patellofemoral components was carried out using  a 3.5 coude shaver.  A combination of biting basket and 3.5 shaver were used  for debridement of the posterior horn mid body junction of the medial  meniscus.   The knee was further examined, noting that the patient's lateral compartment  was relatively intact, and  ACL was intact.  There were loose fragments of  cartilage that were arranged from smaller sizes that were removed upon  insertion of instrumentation to very large fragments requiring pituitary  rongeur removal.   Following the above procedures, the knee was examined again to make sure  there were no other loose fragments of cartilage.  The knee was suctioned  and irrigated.  Instrumentation was removed.  The portals were  reapproximated using a 4-0 nylon.  The knee was injected with a combination  of 0.25% Marcaine with epinephrine as well as 80 mg of Depo-Medrol.   The knee was then dressed into a sterile bulky Jones dressing, and the  patient was transferred to the recovery room in a stable condition.   I will see the patient back in 10 days.  The plan will be to  review with the  patient, including some postoperative therapy.      Madlyn Frankel Charlann Boxer, M.D.  Electronically Signed     MDO/MEDQ  D:  03/12/2006  T:  03/12/2006  Job:  [id number]

## 2010-11-14 NOTE — Assessment & Plan Note (Signed)
[facility]                            CARDIOLOGY OFFICE NOTE   [name], [name]                      MRN:          [id number]  DATE:10/21/2006                            DOB:          05-13-1941    I just dictated a note on Wayne Booth for catheterization tomorrow.  This needs to be priority.     Thomas C. Daleen Squibb, MD, [facility]  Electronically Signed    TCW/MedQ  DD: 10/21/2006  DT: 10/21/2006  Job #: [id number]

## 2010-11-17 DIAGNOSIS — R451 Restlessness and agitation: Secondary | ICD-10-CM | POA: Insufficient documentation

## 2010-11-17 NOTE — Assessment & Plan Note (Signed)
Patient with long history of anxiety, agitation and possible depression. He currently uses Celexa. He doesn't feel that this is strong enough. He also has had trouble with insomnia. Long-term use of trazodone. I'll adjust both medications. Refer to new medication list. Side effects discussed. He'll followup with me in 6-8 weeks. Side effects of medications discussed.

## 2010-12-22 ENCOUNTER — Ambulatory Visit (INDEPENDENT_AMBULATORY_CARE_PROVIDER_SITE_OTHER): Payer: Medicare Other | Admitting: Internal Medicine

## 2010-12-22 ENCOUNTER — Encounter: Payer: Self-pay | Admitting: Internal Medicine

## 2010-12-22 VITALS — BP 116/74 | Temp 99.0°F | Wt 266.0 lb

## 2010-12-22 DIAGNOSIS — R451 Restlessness and agitation: Secondary | ICD-10-CM

## 2010-12-22 DIAGNOSIS — Z8546 Personal history of malignant neoplasm of prostate: Secondary | ICD-10-CM

## 2010-12-22 DIAGNOSIS — C649 Malignant neoplasm of unspecified kidney, except renal pelvis: Secondary | ICD-10-CM

## 2010-12-22 DIAGNOSIS — IMO0002 Reserved for concepts with insufficient information to code with codable children: Secondary | ICD-10-CM

## 2010-12-22 DIAGNOSIS — C61 Malignant neoplasm of prostate: Secondary | ICD-10-CM

## 2010-12-22 DIAGNOSIS — I10 Essential (primary) hypertension: Secondary | ICD-10-CM

## 2010-12-22 LAB — CBC WITH DIFFERENTIAL/PLATELET
Basophils Absolute: 0.1 10*3/uL (ref 0.0–0.1)
HCT: 48.8 % (ref 39.0–52.0)
Hemoglobin: 16.8 g/dL (ref 13.0–17.0)
Lymphs Abs: 2.4 10*3/uL (ref 0.7–4.0)
MCHC: 34.4 g/dL (ref 30.0–36.0)
Monocytes Relative: 9.8 % (ref 3.0–12.0)
Neutro Abs: 9.1 10*3/uL — ABNORMAL HIGH (ref 1.4–7.7)
RDW: 14.1 % (ref 11.5–14.6)

## 2010-12-22 LAB — BASIC METABOLIC PANEL
BUN: 20 mg/dL (ref 6–23)
Chloride: 100 mEq/L (ref 96–112)
Potassium: 4.9 mEq/L (ref 3.5–5.1)

## 2010-12-22 LAB — HEPATIC FUNCTION PANEL
ALT: 34 U/L (ref 0–53)
AST: 30 U/L (ref 0–37)
Total Bilirubin: 0.4 mg/dL (ref 0.3–1.2)

## 2010-12-22 NOTE — Assessment & Plan Note (Signed)
Mood is good Continue meds Sleep is adequately controlled

## 2010-12-28 NOTE — Progress Notes (Signed)
  Subjective:    Patient ID: Wayne Booth, male    DOB: 12/24/1940, 70 y.o.   MRN: [id number]  HPI    Patient Active Problem List  Diagnoses Date Noted  . Agitation--he is doing quite well on current medications.  11/17/2010  .  11/27/2009  .  11/13/2008  . 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  10/02/2008  . MORBID OBESITY--- he is not concentrating on weight loss but he does walk frequently.  10/02/2008  . 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 He has a CAT scan scheduled. He follows up with urology frequently.  12/26/2007  . INSOMNIA-SLEEP DISORDER-UNSPEC 12/26/2007  . TESTOSTERONE DEFICIENCY--- not a candidate for testosterone replacement.  12/28/2006  . HYPERTENSION--- tolerating medications. He is not taking his blood pressure at home.  12/28/2006     Past Medical History  Diagnosis Date  . Cancer   . History of total knee replacement   . Hypertension   . Kidney stone   . OSA (obstructive sleep apnea)   . Prostate cancer    Past Surgical History  Procedure Date  . Appendectomy   . Cholecystectomy   . Nephrectomy   . Insertion prostate radiation seed   . Replacement total knee bilateral   . Knee arthroscopy 2007    left    reports that he has been smoking Cigars.  He does not have any smokeless tobacco history on file. His alcohol and drug histories not on file. family history is not on file. No Known Allergies   Review of Systems     patient denies chest pain, shortness of breath, orthopnea. Denies lower extremity edema, abdominal pain, change in appetite, change in bowel movements. Patient denies rashes, musculoskeletal complaints. No other specific complaints in a complete review of systems.   Objective:   Physical Exam  well-developed well-nourished male in no acute distress. HEENT exam atraumatic, normocephalic, neck supple without jugular venous distention. Chest clear to auscultation cardiac exam S1-S2 are regular.  Abdominal exam overweight with bowel sounds, soft and nontender. Extremities no edema. Neurologic exam is alert with a normal gait.        Assessment & Plan:

## 2010-12-28 NOTE — Assessment & Plan Note (Signed)
Well controlled. Continue current medications  

## 2010-12-28 NOTE — Assessment & Plan Note (Signed)
No known recurrence. He has regular followup with urology.

## 2010-12-28 NOTE — Assessment & Plan Note (Signed)
Patient has regular followup with urology.

## 2011-01-26 ENCOUNTER — Other Ambulatory Visit: Payer: Self-pay | Admitting: Internal Medicine

## 2011-03-25 LAB — CBC
HCT: 43.7
MCHC: 35
MCV: 91.9
Platelets: 347
RDW: 13

## 2011-03-25 LAB — BASIC METABOLIC PANEL
BUN: 22
GFR calc non Af Amer: >60
Glucose, Bld: 135 — ABNORMAL HIGH
Potassium: 3.6

## 2011-03-26 LAB — BASIC METABOLIC PANEL
BUN: 11
BUN: 11
BUN: 17
CO2: 26
CO2: 35 — ABNORMAL HIGH
CO2: 35 — ABNORMAL HIGH
CO2: 42 — ABNORMAL HIGH
Calcium: 7.7 — ABNORMAL LOW
Calcium: 7.7 — ABNORMAL LOW
Calcium: 7.7 — ABNORMAL LOW
Calcium: 8.1 — ABNORMAL LOW
Chloride: 100
Chloride: 105
Chloride: 83 — ABNORMAL LOW
Chloride: 87 — ABNORMAL LOW
Chloride: 90 — ABNORMAL LOW
Creatinine, Ser: 1.3
Creatinine, Ser: 1.62 — ABNORMAL HIGH
Creatinine, Ser: 1.7 — ABNORMAL HIGH
GFR calc Af Amer: 43 — ABNORMAL LOW
GFR calc Af Amer: 47 — ABNORMAL LOW
GFR calc Af Amer: 49 — ABNORMAL LOW
GFR calc Af Amer: 52 — ABNORMAL LOW
GFR calc Af Amer: 59 — ABNORMAL LOW
GFR calc non Af Amer: 36 — ABNORMAL LOW
GFR calc non Af Amer: 39 — ABNORMAL LOW
GFR calc non Af Amer: 41 — ABNORMAL LOW
GFR calc non Af Amer: 46 — ABNORMAL LOW
Glucose, Bld: 109 — ABNORMAL HIGH
Glucose, Bld: 124 — ABNORMAL HIGH
Glucose, Bld: 124 — ABNORMAL HIGH
Glucose, Bld: 129 — ABNORMAL HIGH
Glucose, Bld: 151 — ABNORMAL HIGH
Potassium: 3.4 — ABNORMAL LOW
Potassium: 3.4 — ABNORMAL LOW
Potassium: 3.7
Potassium: 3.8
Potassium: 4.4
Sodium: 128 — ABNORMAL LOW
Sodium: 131 — ABNORMAL LOW
Sodium: 131 — ABNORMAL LOW
Sodium: 131 — ABNORMAL LOW
Sodium: 132 — ABNORMAL LOW
Sodium: 134 — ABNORMAL LOW

## 2011-03-26 LAB — HEMOGLOBIN AND HEMATOCRIT, BLOOD: Hemoglobin: 14.1

## 2011-03-26 LAB — TYPE AND SCREEN

## 2011-03-26 LAB — MYOGLOBIN, URINE: Myoglobin, Ur: 39800 mcg/L — ABNORMAL HIGH (ref <28)

## 2011-03-27 LAB — BASIC METABOLIC PANEL
Chloride: 104
GFR calc non Af Amer: 59 — ABNORMAL LOW
Glucose, Bld: 91
Potassium: 4.1
Sodium: 138

## 2011-04-01 ENCOUNTER — Ambulatory Visit (INDEPENDENT_AMBULATORY_CARE_PROVIDER_SITE_OTHER): Payer: Medicare Other

## 2011-04-01 ENCOUNTER — Other Ambulatory Visit: Payer: Self-pay | Admitting: Internal Medicine

## 2011-04-01 DIAGNOSIS — Z23 Encounter for immunization: Secondary | ICD-10-CM

## 2011-04-01 MED ORDER — CELECOXIB 200 MG PO CAPS: 200.0000 mg | ORAL_CAPSULE | Freq: Every day | ORAL | Status: DC

## 2011-04-01 NOTE — Telephone Encounter (Signed)
No samples, rx sent in electronically

## 2011-04-01 NOTE — Telephone Encounter (Signed)
Pt came by and is req to get a refill of Celebrex 200 mg called in to [facility] on [address] or pick up some samples.

## 2011-05-15 ENCOUNTER — Other Ambulatory Visit: Payer: Self-pay | Admitting: Internal Medicine

## 2011-05-19 ENCOUNTER — Other Ambulatory Visit: Payer: Self-pay | Admitting: Internal Medicine

## 2011-05-19 DIAGNOSIS — G4733 Obstructive sleep apnea (adult) (pediatric): Secondary | ICD-10-CM

## 2011-05-22 ENCOUNTER — Encounter (HOSPITAL_COMMUNITY): Payer: Self-pay | Admitting: *Deleted

## 2011-05-22 ENCOUNTER — Emergency Department (HOSPITAL_COMMUNITY): Payer: Medicare Other

## 2011-05-22 ENCOUNTER — Emergency Department (HOSPITAL_COMMUNITY)
Admission: EM | Admit: 2011-05-22 | Discharge: 2011-05-22 | Disposition: A | Discharge status: Home / Self Care | Payer: Medicare Other | Attending: Emergency Medicine | Admitting: Emergency Medicine

## 2011-05-22 DIAGNOSIS — M5380 Other specified dorsopathies, site unspecified: Secondary | ICD-10-CM | POA: Insufficient documentation

## 2011-05-22 DIAGNOSIS — Z85528 Personal history of other malignant neoplasm of kidney: Secondary | ICD-10-CM | POA: Insufficient documentation

## 2011-05-22 DIAGNOSIS — Z8546 Personal history of malignant neoplasm of prostate: Secondary | ICD-10-CM | POA: Insufficient documentation

## 2011-05-22 DIAGNOSIS — I10 Essential (primary) hypertension: Secondary | ICD-10-CM | POA: Insufficient documentation

## 2011-05-22 DIAGNOSIS — R109 Unspecified abdominal pain: Secondary | ICD-10-CM | POA: Insufficient documentation

## 2011-05-22 DIAGNOSIS — M62830 Muscle spasm of back: Secondary | ICD-10-CM

## 2011-05-22 DIAGNOSIS — Z905 Acquired absence of kidney: Secondary | ICD-10-CM | POA: Insufficient documentation

## 2011-05-22 DIAGNOSIS — S39012A Strain of muscle, fascia and tendon of lower back, initial encounter: Secondary | ICD-10-CM

## 2011-05-22 DIAGNOSIS — R112 Nausea with vomiting, unspecified: Secondary | ICD-10-CM | POA: Insufficient documentation

## 2011-05-22 DIAGNOSIS — Z9089 Acquired absence of other organs: Secondary | ICD-10-CM | POA: Insufficient documentation

## 2011-05-22 DIAGNOSIS — X58XXXA Exposure to other specified factors, initial encounter: Secondary | ICD-10-CM | POA: Insufficient documentation

## 2011-05-22 DIAGNOSIS — S335XXA Sprain of ligaments of lumbar spine, initial encounter: Secondary | ICD-10-CM | POA: Insufficient documentation

## 2011-05-22 HISTORY — DX: Malignant neoplasm of unspecified kidney, except renal pelvis: C64.9

## 2011-05-22 LAB — CBC
Hemoglobin: 16.3 g/dL (ref 13.0–17.0)
MCV: 88.4 fL (ref 78.0–100.0)
Platelets: 267 10*3/uL (ref 150–400)
RBC: 5.19 MIL/uL (ref 4.22–5.81)
WBC: 12 10*3/uL — ABNORMAL HIGH (ref 4.0–10.5)

## 2011-05-22 LAB — URINALYSIS, ROUTINE W REFLEX MICROSCOPIC
Glucose, UA: NEGATIVE mg/dL
Hgb urine dipstick: NEGATIVE
Specific Gravity, Urine: 1.022 (ref 1.005–1.030)
pH: 6 (ref 5.0–8.0)

## 2011-05-22 LAB — BASIC METABOLIC PANEL
BUN: 20 mg/dL (ref 6–23)
CO2: 27 mEq/L (ref 19–32)
Glucose, Bld: 122 mg/dL — ABNORMAL HIGH (ref 70–99)
Potassium: 4.1 mEq/L (ref 3.5–5.1)
Sodium: 134 mEq/L — ABNORMAL LOW (ref 135–145)

## 2011-05-22 LAB — DIFFERENTIAL
Eosinophils Relative: 3 % (ref 0–5)
Lymphocytes Relative: 20 % (ref 12–46)
Lymphs Abs: 2.4 10*3/uL (ref 0.7–4.0)
Monocytes Relative: 10 % (ref 3–12)
Neutrophils Relative %: 66 % (ref 43–77)

## 2011-05-22 MED ORDER — OXYCODONE-ACETAMINOPHEN 5-325 MG PO TABS: 2.0000 | ORAL_TABLET | ORAL | Status: AC | PRN

## 2011-05-22 MED ORDER — HYDROMORPHONE HCL PF 2 MG/ML IJ SOLN
INTRAMUSCULAR | Status: AC
  Administered 2011-05-22: 1 mg via INTRAVENOUS
  Filled 2011-05-22: qty 1

## 2011-05-22 MED ORDER — IBUPROFEN 600 MG PO TABS: 600.0000 mg | ORAL_TABLET | Freq: Three times a day (TID) | ORAL | Status: AC | PRN

## 2011-05-22 MED ORDER — HYDROMORPHONE HCL PF 1 MG/ML IJ SOLN: 1.0000 mg | Freq: Once | INTRAMUSCULAR | Status: AC

## 2011-05-22 MED ORDER — KETOROLAC TROMETHAMINE 30 MG/ML IJ SOLN
30.0000 mg | Freq: Once | INTRAMUSCULAR | Status: AC
  Administered 2011-05-22: 30 mg via INTRAVENOUS
  Filled 2011-05-22: qty 1

## 2011-05-22 MED ORDER — ONDANSETRON HCL 4 MG/2ML IJ SOLN
4.0000 mg | Freq: Once | INTRAMUSCULAR | Status: AC
  Administered 2011-05-22: 4 mg via INTRAVENOUS
  Filled 2011-05-22: qty 2

## 2011-05-22 MED ORDER — DIAZEPAM 5 MG PO TABS
5.0000 mg | ORAL_TABLET | Freq: Once | ORAL | Status: AC
  Administered 2011-05-22: 5 mg via ORAL
  Filled 2011-05-22: qty 1

## 2011-05-22 MED ORDER — DIAZEPAM 5 MG PO TABS: 5.0000 mg | ORAL_TABLET | Freq: Four times a day (QID) | ORAL | Status: AC | PRN

## 2011-05-22 NOTE — ED Notes (Signed)
Pt states he started to have right flank pain yesterday with nausea. 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 Pt denies blood in urine. Right side tenderness

## 2011-05-22 NOTE — ED Notes (Signed)
MD at bedside. 

## 2011-05-22 NOTE — ED Provider Notes (Signed)
History     CSN: [id number] Arrival date & time: 05/22/2011 12:40 PM   First MD Initiated Contact with Patient 05/22/11 1342      Chief Complaint  Patient presents with  . Flank Pain    pt has only one kidney and pain to that side    (Consider location/radiation/quality/duration/timing/severity/associated sxs/prior treatment) HPI Comments: Is a 70 year old male who has a history of left renal cancer and prior renal nephrectomy approximately 2 years ago for renal cancer. He has only a right kidney remaining, and he was concerned that he had cancer of the right kidney now or a kidney stone. The reason he is concerned about this as do to the acute onset of sharp and aching pain at the right flank yesterday but he says radiates down his right leg. He denies any nausea, vomiting, abdominal pain, dysuria, hematuria, urinary urgency or hesitancy. He denies fever or chills.  Patient is a 70 y.o. male presenting with flank pain. The history is provided by the patient.  Flank Pain This is a new problem. The current episode started yesterday. The problem occurs constantly. The problem has not changed since onset.Pertinent negatives include no chest pain, no abdominal pain, no headaches and no shortness of breath. The symptoms are aggravated by twisting and bending. The symptoms are relieved by position, lying down, NSAIDs and narcotics. Treatments tried: NSAIDS, narcotics. The treatment provided moderate relief.    Past Medical History  Diagnosis Date  . History of total knee replacement   . Hypertension   . Kidney stone   . OSA (obstructive sleep apnea)   . Cancer   . Prostate cancer   . Kidney cancer, primary, with metastasis from kidney to other site     Past Surgical History  Procedure Date  . Appendectomy   . Cholecystectomy   . Nephrectomy   . Insertion prostate radiation seed   . Replacement total knee bilateral   . Knee arthroscopy 2007    left    History reviewed. No  pertinent family history.  History  Substance Use Topics  . Smoking status: Passive Smoker    Types: Cigars  . Smokeless tobacco: Not on file  . Alcohol Use: 1.8 oz/week    2 Shots of liquor, 1 Cans of beer per week      Review of Systems  Constitutional: Negative for fever, chills, activity change, appetite change and fatigue.  HENT: Negative.   Eyes: Negative.   Respiratory: Negative for cough, shortness of breath and wheezing.   Cardiovascular: Negative for chest pain.  Gastrointestinal: Positive for nausea and vomiting. Negative for abdominal pain, diarrhea, constipation, blood in stool, abdominal distention, anal bleeding and rectal pain.  Genitourinary: Positive for flank pain. Negative for dysuria, urgency, frequency, hematuria, decreased urine volume, discharge, genital sores, penile pain and testicular pain.  Musculoskeletal: Negative.   Skin: Negative for color change and rash.  Neurological: Negative for light-headedness and headaches.  Hematological: Does not bruise/bleed easily.  Psychiatric/Behavioral: Negative.     Allergies  Review of patient's allergies indicates no known allergies.  Home Medications   Current Outpatient Rx  Name Route Sig Dispense Refill  . ALBUTEROL SULFATE HFA 108 (90 BASE) MCG/ACT IN AERS Inhalation Inhale 2 puffs into the lungs every 6 (six) hours as needed.      Marland Kitchen HYDROCHLOROTHIAZIDE 25 MG PO TABS  TAKE 1 TAB BY MOUTH EVERY MORNING 90 tablet 1    CYCLE FILL MEDICATION. Authorization is required f ...  Marland Kitchen  LOSARTAN POTASSIUM 100 MG PO TABS Oral Take 0.5 tablets (50 mg total) by mouth daily. 90 tablet 1    CYCLE FILL MEDICATION. Authorization is required f ...  . THERA M PLUS PO TABS Oral Take 1 tablet by mouth daily.      Marland Kitchen NORTRIPTYLINE HCL 25 MG PO CAPS  TAKE 1 CAPSULE (25 MG TOTAL) BY MOUTH AT BEDTIME. 30 capsule 1    CYCLE FILL MEDICATION. Authorization is required f ...  . SERTRALINE HCL 100 MG PO TABS Oral Take 1 tablet (100 mg  total) by mouth daily. 30 tablet 9    BP 135/78  Pulse 99  Temp(Src) 97.7 F (36.5 C) (Oral)  Resp 22  Ht 5\' 11"  (1.803 m)  Wt 260 lb (117.935 kg)  BMI 36.26 kg/m2  SpO2 95%  Physical Exam  Nursing note and vitals reviewed. Constitutional: He is oriented to person, place, and time. He appears well-nourished. No distress.  HENT:  Head: Normocephalic and atraumatic.  Eyes: EOM are normal. Pupils are equal, round, and reactive to light.  Neck: Normal range of motion. Neck supple. No JVD present.  Cardiovascular: Normal rate, regular rhythm, normal heart sounds and intact distal pulses.  Exam reveals no gallop and no friction rub.   No murmur heard. Pulmonary/Chest: Effort normal and breath sounds normal. No respiratory distress. He has no wheezes. He has no rales. He exhibits no tenderness.  Abdominal: Soft. Bowel sounds are normal. He exhibits no distension, no fluid wave, no ascites and no mass. There is no tenderness. There is CVA tenderness. There is no rebound and no guarding.  Musculoskeletal: Normal range of motion. He exhibits no edema and no tenderness.       Lumbar back: He exhibits tenderness, pain and spasm. He exhibits normal range of motion, no bony tenderness, no swelling, no edema and no deformity.       Back:  Neurological: He is alert and oriented to person, place, and time. He has normal reflexes. No cranial nerve deficit. He exhibits normal muscle tone. Coordination normal.  Skin: Skin is warm and dry. No rash noted. He is not diaphoretic. No erythema. No pallor.  Psychiatric: He has a normal mood and affect. His behavior is normal. Judgment and thought content normal.    ED Course  Procedures (including critical care time)  Labs Reviewed  CBC - Abnormal; Notable for the following:    WBC 12.0 (*)    All other components within normal limits  DIFFERENTIAL - Abnormal; Notable for the following:    Neutro Abs 8.0 (*)    Monocytes Absolute 1.2 (*)    All other  components within normal limits  BASIC METABOLIC PANEL - Abnormal; Notable for the following:    Sodium 134 (*)    Glucose, Bld 122 (*)    GFR calc non Af Amer 60 (*)    GFR calc Af Amer 69 (*)    All other components within normal limits  URINALYSIS, ROUTINE W REFLEX MICROSCOPIC - Abnormal; Notable for the following:    Appearance CLOUDY (*)    All other components within normal limits   Ct Abdomen Pelvis Wo Contrast  05/22/2011  *RADIOLOGY REPORT*  Clinical Data: Right flank pain  CT ABDOMEN AND PELVIS WITHOUT CONTRAST  Technique:  Multidetector CT imaging of the abdomen and pelvis was performed following the standard protocol without intravenous contrast.  Comparison: 12/16/2010  Findings: Lung bases are stable.  Again noted status post left nephrectomy.  The  patient is status post cholecystectomy. Unenhanced liver is unremarkable.  Sagittal images of the spine shows degenerative changes thoracolumbar spine.  Significant disc space flattening with vacuum disc phenomenon noted at L5 S1 level.  Probable cyst in the upper pole of the right kidney measures 2 cm stable from prior exam.  No right nephrolithiasis.  No calcified right ureteral calculi are identified.  No hydronephrosis or hydroureter.  Unenhanced spleen pancreas and adrenal glands are unremarkable.  Mild atherosclerotic calcifications distal abdominal aorta and common iliac arteries.  No small bowel obstruction.  No ascites or free air.  No adenopathy.  There is no pericecal inflammation.  The patient status post appendectomy.  The urinary bladder is unremarkable.  Multiple radiation seeds are noted in prostate gland region.  No destructive bony lesions are noted within pelvis.  Small hiatal hernia is noted.  IMPRESSION:  1.  No right nephrolithiasis.  No right hydronephrosis or hydroureter. 2.  Status post left nephrectomy.  Status post cholecystectomy. 3.  No calcified right ureteral calculus is noted. 4.  Radiation seeds are noted in  prostate gland region.  Original Report Authenticated By: Natasha Mead, M.D.     No diagnosis found.    MDM  Renal colic, urinary tract infection, ureteric lithiasis, lumbar strain, lumbar muscle spasm are all entertained amongst other potential etiologies of the patient's symptoms in the differential diagnosis.        Felisa Bonier, MD 05/22/11 1537

## 2011-06-09 ENCOUNTER — Encounter: Payer: Self-pay | Admitting: Internal Medicine

## 2011-06-09 ENCOUNTER — Ambulatory Visit (INDEPENDENT_AMBULATORY_CARE_PROVIDER_SITE_OTHER): Payer: Medicare Other | Admitting: Internal Medicine

## 2011-06-09 DIAGNOSIS — J45909 Unspecified asthma, uncomplicated: Secondary | ICD-10-CM | POA: Insufficient documentation

## 2011-06-09 DIAGNOSIS — Z23 Encounter for immunization: Secondary | ICD-10-CM

## 2011-06-09 DIAGNOSIS — I10 Essential (primary) hypertension: Secondary | ICD-10-CM

## 2011-06-09 DIAGNOSIS — J9801 Acute bronchospasm: Secondary | ICD-10-CM

## 2011-06-09 DIAGNOSIS — G250 Essential tremor: Secondary | ICD-10-CM

## 2011-06-09 MED ORDER — BUDESONIDE-FORMOTEROL FUMARATE 160-4.5 MCG/ACT IN AERO: 2.0000 | INHALATION_SPRAY | Freq: Two times a day (BID) | RESPIRATORY_TRACT | Status: DC

## 2011-06-09 MED ORDER — PROPRANOLOL HCL 10 MG PO TABS: 10.0000 mg | ORAL_TABLET | Freq: Two times a day (BID) | ORAL | Status: DC

## 2011-06-09 MED ORDER — PAROXETINE HCL 20 MG PO TABS: 20.0000 mg | ORAL_TABLET | Freq: Every day | ORAL | Status: DC

## 2011-06-09 NOTE — Assessment & Plan Note (Signed)
BP Readings from Last 3 Encounters:  06/09/11 124/80  05/22/11 131/71  12/22/10 116/74  fair control continue same meds

## 2011-06-09 NOTE — Assessment & Plan Note (Signed)
Worsening Will treat with propranolol---discussed beta blocker in light of bronchospasm

## 2011-06-09 NOTE — Progress Notes (Signed)
Patient ID: Wayne Booth, male   DOB: 01-30-1941, 70 y.o.   MRN: [id number] Essential tremor is worsening---spills food from utensil  Has noted wheezing especially when he first gets up in the a.m. And when he first starts to ambulate.  He would like to take one pill that will address sleep and anxiety.  htn--tolerating meds  Past Medical History  Diagnosis Date  . History of total knee replacement   . Hypertension   . Kidney stone   . OSA (obstructive sleep apnea)   . Cancer   . Prostate cancer   . Kidney cancer, primary, with metastasis from kidney to other site     History   Social History  . Marital Status: Married    Spouse Name: N/A    Number of Children: N/A  . Years of Education: N/A   Occupational History  . Not on file.   Social History Main Topics  . Smoking status: Current Some Day Smoker    Types: Cigars  . Smokeless tobacco: Not on file  . Alcohol Use: 1.8 oz/week    2 Shots of liquor, 1 Cans of beer per week  . Drug Use: Not on file  . Sexually Active: Not on file   Other Topics Concern  . Not on file   Social History Narrative  . No narrative on file    Past Surgical History  Procedure Date  . Appendectomy   . Cholecystectomy   . Nephrectomy   . Insertion prostate radiation seed   . Replacement total knee bilateral   . Knee arthroscopy 2007    left    No family history on file.  No Known Allergies  Current Outpatient Prescriptions on File Prior to Visit  Medication Sig Dispense Refill  . albuterol (PROAIR HFA) 108 (90 BASE) MCG/ACT inhaler Inhale 2 puffs into the lungs every 6 (six) hours as needed.        . hydrochlorothiazide (HYDRODIURIL) 25 MG tablet TAKE 1 TAB BY MOUTH EVERY MORNING  90 tablet  1  . losartan (COZAAR) 100 MG tablet Take 0.5 tablets (50 mg total) by mouth daily.  90 tablet  1  . Multiple Vitamins-Minerals (MULTIVITAMINS THER. W/MINERALS) TABS Take 1 tablet by mouth daily.        . nortriptyline (PAMELOR) 25 MG  capsule TAKE 1 CAPSULE (25 MG TOTAL) BY MOUTH AT BEDTIME.  30 capsule  1  . sertraline (ZOLOFT) 100 MG tablet Take 1 tablet (100 mg total) by mouth daily.  30 tablet  9     patient denies chest pain, shortness of breath, orthopnea. Denies lower extremity edema, abdominal pain, change in appetite, change in bowel movements. Patient denies rashes, musculoskeletal complaints. No other specific complaints in a complete review of systems.   BP 124/80  Pulse 94  Temp(Src) 98.2 F (36.8 C) (Oral)  Ht 5' 11.5" (1.816 m)  Wt 276 lb (125.193 kg)  BMI 37.96 kg/m2  SpO2 97%  well-developed well-nourished male in no acute distress. HEENT exam atraumatic, normocephalic, neck supple without jugular venous distention. Chest clear to auscultation cardiac exam S1-S2 are regular. Abdominal exam overweight with bowel sounds, soft and nontender. Extremities no edema. Neurologic exam is alert with a normal gait.

## 2011-06-09 NOTE — Assessment & Plan Note (Signed)
By hx,  i do not think he has cardiac dzs causing his sxs even though he has multiple risk factors for CAD

## 2011-06-25 ENCOUNTER — Ambulatory Visit: Payer: PRIVATE HEALTH INSURANCE | Admitting: Internal Medicine

## 2011-07-01 ENCOUNTER — Telehealth: Payer: Self-pay | Admitting: Internal Medicine

## 2011-07-01 NOTE — Telephone Encounter (Signed)
appt scheduled

## 2011-07-01 NOTE — Telephone Encounter (Signed)
Patient concerned about sleep apnea requesting a call from you.  Said he left you a message last week.

## 2011-07-07 ENCOUNTER — Ambulatory Visit (INDEPENDENT_AMBULATORY_CARE_PROVIDER_SITE_OTHER): Payer: Medicare Other | Admitting: Internal Medicine

## 2011-07-07 ENCOUNTER — Encounter: Payer: Self-pay | Admitting: Internal Medicine

## 2011-07-07 VITALS — BP 124/82 | HR 88 | Temp 97.5°F | Ht 71.0 in | Wt 280.0 lb

## 2011-07-07 DIAGNOSIS — G252 Other specified forms of tremor: Secondary | ICD-10-CM

## 2011-07-07 DIAGNOSIS — G250 Essential tremor: Secondary | ICD-10-CM

## 2011-07-07 DIAGNOSIS — J9801 Acute bronchospasm: Secondary | ICD-10-CM

## 2011-07-07 DIAGNOSIS — G4733 Obstructive sleep apnea (adult) (pediatric): Secondary | ICD-10-CM | POA: Insufficient documentation

## 2011-07-07 MED ORDER — BECLOMETHASONE DIPROPIONATE 40 MCG/ACT IN AERS: 2.0000 | INHALATION_SPRAY | Freq: Two times a day (BID) | RESPIRATORY_TRACT | Status: DC

## 2011-07-07 MED ORDER — LORATADINE 10 MG PO TABS: 10.0000 mg | ORAL_TABLET | Freq: Every day | ORAL | Status: DC

## 2011-07-07 NOTE — Assessment & Plan Note (Signed)
Essentially resolved Will change meds based on cost (samples provided---QVAR)

## 2011-07-07 NOTE — Assessment & Plan Note (Signed)
Needs further eval Schedule for split night sleep study

## 2011-07-07 NOTE — Progress Notes (Signed)
  Subjective:    Patient ID: Wayne Booth, male    DOB: Nov 28, 1940, 71 y.o.   MRN: [id number]  HPI Breathing is much better with inhalers...has not taken recently due to expense. Still with some wheezing Admits to some allergy sxs: rhinorrhea, eye tearing in a.m.  Snores and quits breathing when sleeping.  Essential tremor resolved on daily (once daily) propranolol  Past Medical History  Diagnosis Date  . History of total knee replacement   . Hypertension   . Kidney stone   . OSA (obstructive sleep apnea)   . Cancer   . Prostate cancer   . Kidney cancer, primary, with metastasis from kidney to other site     History   Social History  . Marital Status: Married    Spouse Name: N/A    Number of Children: N/A  . Years of Education: N/A   Occupational History  . Not on file.   Social History Main Topics  . Smoking status: Current Some Day Smoker    Types: Cigars  . Smokeless tobacco: Not on file  . Alcohol Use: 1.8 oz/week    2 Shots of liquor, 1 Cans of beer per week  . Drug Use: Not on file  . Sexually Active: Not on file   Other Topics Concern  . Not on file   Social History Narrative  . No narrative on file    Past Surgical History  Procedure Date  . Appendectomy   . Cholecystectomy   . Nephrectomy   . Insertion prostate radiation seed   . Replacement total knee bilateral   . Knee arthroscopy 2007    left    No family history on file.  No Known Allergies  Current Outpatient Prescriptions on File Prior to Visit  Medication Sig Dispense Refill  . hydrochlorothiazide (HYDRODIURIL) 25 MG tablet TAKE 1 TAB BY MOUTH EVERY MORNING  90 tablet  1  . losartan (COZAAR) 100 MG tablet Take 0.5 tablets (50 mg total) by mouth daily.  90 tablet  1  . Multiple Vitamins-Minerals (MULTIVITAMINS THER. W/MINERALS) TABS Take 1 tablet by mouth daily.        . propranolol (INDERAL) 10 MG tablet Take 1 tablet (10 mg total) by mouth 2 (two) times daily.  60 tablet  3  .  albuterol (PROAIR HFA) 108 (90 BASE) MCG/ACT inhaler Inhale 2 puffs into the lungs every 6 (six) hours as needed.        . budesonide-formoterol (SYMBICORT) 160-4.5 MCG/ACT inhaler Inhale 2 puffs into the lungs 2 (two) times daily.  1 Inhaler  3   Review of Systems   patient denies chest pain, shortness of breath, orthopnea. Denies lower extremity edema, abdominal pain, change in appetite, change in bowel movements. Patient denies rashes, musculoskeletal complaints. No other specific complaints in a complete review of systems.     Objective:   Physical Exam  BP 124/82  Pulse 88  Temp(Src) 97.5 F (36.4 C) (Oral)  Ht 5\' 11"  (1.803 m)  Wt 280 lb (127.007 kg)  BMI 39.05 kg/m2  well-developed well-nourished male in no acute distress. HEENT exam atraumatic, normocephalic, neck supple without jugular venous distention. Chest clear to auscultation cardiac exam S1-S2 are regular. Abdominal exam overweight with bowel sounds, soft and nontender. Extremities no edema. Neurologic exam is alert with a normal gait.    Assessment & Plan:

## 2011-07-07 NOTE — Assessment & Plan Note (Signed)
Resolved Continue propranolol

## 2011-07-08 ENCOUNTER — Ambulatory Visit (HOSPITAL_BASED_OUTPATIENT_CLINIC_OR_DEPARTMENT_OTHER): Payer: Medicare Other | Attending: Internal Medicine | Admitting: Radiology

## 2011-07-08 VITALS — Ht 72.0 in | Wt 275.0 lb

## 2011-07-08 DIAGNOSIS — G478 Other sleep disorders: Secondary | ICD-10-CM | POA: Insufficient documentation

## 2011-07-08 DIAGNOSIS — Z9989 Dependence on other enabling machines and devices: Secondary | ICD-10-CM

## 2011-07-08 DIAGNOSIS — G4733 Obstructive sleep apnea (adult) (pediatric): Secondary | ICD-10-CM | POA: Insufficient documentation

## 2011-07-13 ENCOUNTER — Telehealth: Payer: Self-pay | Admitting: Internal Medicine

## 2011-07-13 DIAGNOSIS — G4730 Sleep apnea, unspecified: Secondary | ICD-10-CM

## 2011-07-13 NOTE — Telephone Encounter (Signed)
Pt called and had a sleep study done and was told that he would need to sch ov with pcp in order to get his sleep mask and machine order. Pt needs to know if he needs to come back in to see pcp or not.

## 2011-07-16 NOTE — Telephone Encounter (Signed)
Pt is callback does pt need ov?

## 2011-07-16 NOTE — Telephone Encounter (Signed)
LMTCB

## 2011-07-17 DIAGNOSIS — G4733 Obstructive sleep apnea (adult) (pediatric): Secondary | ICD-10-CM

## 2011-07-17 NOTE — Telephone Encounter (Signed)
Referred to home health for fitting of mask and cpap

## 2011-07-18 NOTE — Procedures (Signed)
NAME:  Wayne Booth, Ojeda NO.:  [id number]  MEDICAL RECORD NO.:  [id number]          PATIENT TYPE:  OUT  LOCATION:  SLEEP CENTER                 [facility]AC[facility]  [facility]  PHYSICIAN:  Barbaraann Share, MD,FCCPDATE OF BIRTH:  06/28[birth year]1942  DATE OF STUDY:  07/08/2011                           NOCTURNAL POLYSOMNOGRAM  REFERRING PHYSICIAN:  Bruce H. Swords, MD  INDICATION FOR STUDY:  Hypersomnia with sleep apnea.  EPWORTH SLEEPINESS SCORE:  10.  MEDICATIONS:  SLEEP ARCHITECTURE:  The patient had a total sleep time of 346 minutes with no slow-wave sleep and only 29 minutes of REM.  Sleep onset latency was normal at 9 minutes, and REM onset was prolonged until the very end of the study.  Sleep efficiency was 83% during the diagnostic portion of the study and 93% during the titration portion of the study.  RESPIRATORY DATA:  The patient was found to have, by split night protocol, 327 obstructive events in the first 173 minutes of sleep. This gave him an apnea/hypopnea index of 114 events per hour.  The events occurred in all body positions, and there was very loud snoring noted throughout.  By protocol, the patient was then fitted with a medium ResMed Quattro full-face mask, and CPAP titration was initiated. At a pressure of 22 cm of water, he appeared to have good control of his obstructive events and snoring, even through REM.  OXYGEN DATA:  There was O2 desaturation as low as 79% with the patient's obstructive events.  CARDIAC DATA:  No clinically significant arrhythmias were noted.  MOVEMENT-PARASOMNIA:  The patient had no significant leg jerks or other abnormalities seen.  IMPRESSIONS-RECOMMENDATIONS:  Severe obstructive sleep apnea/hypopnea syndrome with an apnea/hypopnea of 114 events per hour and oxygen desaturation as low as 79% during the diagnostic portion of the study.  By split night protocol, he was then fitted with a medium ResMed Quattro full-face mask, and  titrated to a final pressure of 22 cm of water.  He should also be encouraged to work aggressively on weight loss.     Barbaraann Share, MD,FCCP Diplomate, American Board of Sleep Medicine Electronically Signed    [facility]/MEDQ  D:  07/17/2011 09:30:37  T:  07/18/2011 00:49:29  Job:  [id number]

## 2011-07-21 ENCOUNTER — Telehealth: Payer: Self-pay | Admitting: *Deleted

## 2011-07-21 NOTE — Telephone Encounter (Signed)
LMTCB- When he calls back just needs appt with [facility] for sleep consult

## 2011-07-21 NOTE — Telephone Encounter (Signed)
Per KC, pt needs sleep consult with him per Dr. Cato Mulligan.  LMOM for pt TCB.

## 2011-07-21 NOTE — Telephone Encounter (Addendum)
Pt returned Megan's call.  Pt would like an earlier appt than wheat i was able to offer.  Antionette Fairy

## 2011-07-21 NOTE — Telephone Encounter (Signed)
Called and spoke with pt.  Pt scheduled to see [facility] 07/28/2011 and to arrive at 9 am

## 2011-07-28 ENCOUNTER — Encounter: Payer: Self-pay | Admitting: Pulmonary Disease

## 2011-07-28 ENCOUNTER — Ambulatory Visit (INDEPENDENT_AMBULATORY_CARE_PROVIDER_SITE_OTHER): Payer: Medicare Other | Admitting: Pulmonary Disease

## 2011-07-28 VITALS — BP 122/82 | HR 87 | Temp 97.7°F | Ht 71.0 in | Wt 283.0 lb

## 2011-07-28 DIAGNOSIS — G4733 Obstructive sleep apnea (adult) (pediatric): Secondary | ICD-10-CM

## 2011-07-28 NOTE — Assessment & Plan Note (Signed)
The patient has very severe obstructive sleep apnea by his recent sleep study, and I have discussed the pathophysiology of sleep apnea with him including its impact on his cardiovascular health.  His only treatment option here is CPAP couple weight reduction, and the patient is agreeable.  His optimal pressure at the time of his sleep study was 22 cm, however this is a fairly high pressure that few can tolerate on a long-term basis. Couple start the patient had a moderate level, and titrate upwards over a period of time.  We will try to get as close to 22 cm as possible, but I have encouraged him to work on weight loss which will also decrease his pressure needs.

## 2011-07-28 NOTE — Patient Instructions (Signed)
Will start on cpap at moderate level, and gradually increase the pressure.  Please call if having tolerance issues. Work on weight loss followup with me in 5 weeks.

## 2011-07-28 NOTE — Progress Notes (Signed)
  Subjective:    Patient ID: Wayne Booth, male    DOB: 1941-03-18, 71 y.o.   MRN: [id number]  HPI The patient is a 71 year old male who I have been asked to see for severe obstructive sleep apnea.  He recently underwent  NPSG which showed an AHI of 114 events per hour with desaturations as low as 79%.  He was titrated on CPAP to an optimal pressure of 22 cm.  The patient has been noted to have very loud snoring with an abnormal breathing pattern during sleep.  He also admits to choking arousals.  He has frequent awakenings at night, and is not rested in the mornings upon arising.  He has significant sleep pressure during the day, and will fall asleep very easily with periods of inactivity.  The patient denies any issues with sleepiness while driving.  The patient states that his weight is up 30 pounds over the last 2 years, and his Epworth sleepiness score at the time of his sleep study was 10.  Sleep Questionnaire: What time do you typically go to bed?( Between what hours) 11:00 How long does it take you to fall asleep? depends How many times during the night do you wake up? What time do you get out of bed to start your day? 0700 Do you drive or operate heavy machinery in your occupation? No How much has your weight changed (up or down) over the past two years? (In pounds) 30 lb (13.608 kg) Have you ever had a sleep study before? Yes If yes, location of study? [facility] If yes, date of study? 06/2011 Do you currently use CPAP? No Do you wear oxygen at any time? No    Review of Systems  Constitutional: Positive for unexpected weight change. Negative for fever.  HENT: Positive for congestion. Negative for ear pain, nosebleeds, sore throat, rhinorrhea, sneezing, trouble swallowing, dental problem, postnasal drip and sinus pressure.   Eyes: Negative for redness and itching.  Respiratory: Positive for shortness of breath. Negative for cough, chest tightness and wheezing.   Cardiovascular: Negative  for palpitations and leg swelling.  Gastrointestinal: Negative for nausea and vomiting.  Genitourinary: Negative for dysuria.  Musculoskeletal: Negative for joint swelling.  Skin: Negative for rash.  Neurological: Negative for headaches.  Hematological: Does not bruise/bleed easily.  Psychiatric/Behavioral: Positive for dysphoric mood. The patient is not nervous/anxious.        Objective:   Physical Exam Constitutional:  Morbidly obese male, no acute distress  HENT:  Nares with deviated septum to left, turbinate hypertrophy noted.  Oropharynx without exudate, palate and uvula are normal  Eyes:  Perrla, eomi, no scleral icterus  Neck:  No JVD, no TMG  Cardiovascular:  Normal rate, regular rhythm, no rubs or gallops.  No murmurs        Intact distal pulses  Pulmonary :  Normal breath sounds, no stridor or respiratory distress   No rales, rhonchi, or wheezing  Abdominal:  Soft, nondistended, bowel sounds present.  No tenderness noted.   Musculoskeletal: + lower extremity edema noted.  Lymph Nodes:  No cervical lymphadenopathy noted  Skin:  No cyanosis noted  Neurologic:  Alert, appropriate, moves all 4 extremities without obvious deficit.         Assessment & Plan:

## 2011-07-31 ENCOUNTER — Other Ambulatory Visit: Payer: Self-pay | Admitting: Internal Medicine

## 2011-08-05 ENCOUNTER — Other Ambulatory Visit: Payer: Self-pay | Admitting: Internal Medicine

## 2011-08-20 ENCOUNTER — Other Ambulatory Visit: Payer: Self-pay | Admitting: Internal Medicine

## 2011-09-01 ENCOUNTER — Encounter: Payer: Self-pay | Admitting: Pulmonary Disease

## 2011-09-01 ENCOUNTER — Ambulatory Visit (INDEPENDENT_AMBULATORY_CARE_PROVIDER_SITE_OTHER): Payer: Medicare Other | Admitting: Pulmonary Disease

## 2011-09-01 VITALS — BP 140/88 | HR 69 | Temp 97.5°F | Ht 71.0 in | Wt 284.6 lb

## 2011-09-01 DIAGNOSIS — G4733 Obstructive sleep apnea (adult) (pediatric): Secondary | ICD-10-CM

## 2011-09-01 NOTE — Patient Instructions (Signed)
Will change your pressure over to Auto for the next few weeks in order to optimize your level.  Will let you know the results. Work on weight loss followup with me in 6mos, but call if having issues with cpap.

## 2011-09-01 NOTE — Assessment & Plan Note (Signed)
The patient has been very compliant with CPAP by his download, and has seen a tremendous difference in his sleep and alertness during the day.  At this point, we need to optimize his pressure, and I have also encouraged him to work aggressively on weight loss. Care Plan:  At this point, will arrange for the patient's machine to be changed over to auto mode for 2 weeks to optimize their pressure.  I will review the downloaded data once sent by dme, and also evaluate for compliance, leaks, and residual osa.  I will call the patient and dme to discuss the results, and have the patient's machine set appropriately.  This will serve as the pt's cpap pressure titration.

## 2011-09-01 NOTE — Progress Notes (Signed)
  Subjective:    Patient ID: Wayne Booth, male    DOB: [birth year], 71 y.o.   MRN: [id number]  HPI The patient comes in today for followup of his very severe obstructive sleep apnea.  He was started on CPAP at the last visit, and comes in today to evaluate his progress.  He has been compliant with the device, and is having no issues with his mask fit or pressure.  He has seen a tremendous improvement in his sleep and daytime alertness since being on the device.  His download shows fantastic compliance.    Review of Systems  Constitutional: Negative for fever and unexpected weight change.  HENT: Negative for ear pain, nosebleeds, congestion, sore throat, rhinorrhea, sneezing, trouble swallowing, dental problem, postnasal drip and sinus pressure.   Eyes: Negative for redness and itching.  Respiratory: Negative for cough, chest tightness, shortness of breath and wheezing.   Cardiovascular: Negative for palpitations and leg swelling.  Gastrointestinal: Negative for nausea and vomiting.  Genitourinary: Negative for dysuria.  Musculoskeletal: Negative for joint swelling.  Skin: Negative for rash.  Neurological: Negative for headaches.  Hematological: Does not bruise/bleed easily.  Psychiatric/Behavioral: Negative for dysphoric mood. The patient is not nervous/anxious.        Objective:   Physical Exam Obese male in no acute distress No skin breakdown or pressure necrosis from the CPAP mask Lower extremities with edema noted, no cyanosis Alert, much more awake this visit, moves all 4 extremities.       Assessment & Plan:

## 2011-09-22 ENCOUNTER — Telehealth: Payer: Self-pay | Admitting: Pulmonary Disease

## 2011-09-22 NOTE — Telephone Encounter (Signed)
Pt verbalized understanding of the auto set and says he will continue on this so Dr. Shelle Iron can figure out his optimum pressure setting. He will call if there are any other questions or problems.

## 2011-09-22 NOTE — Telephone Encounter (Signed)
We are trying to optimize his pressure on auto.  If the machine ramps up to a high pressure, that means that his apnea is bad enough that it requires a much higher pressure to "break it".  Currently he is on 5-20cm for his pressure range, and we can change to 5-15cm if he prefers.  Let me know what he thinks.

## 2011-09-22 NOTE — Telephone Encounter (Signed)
Spoke with pt and he states that since pressure has been set on auto, the pressure is too much and he feels like the mask in blowing off of his face and he is smothering. He states that he tried a larger mask, but this did not change anything. Wants to know if the pressure can be lowered. Please advise, thanks!

## 2011-10-05 ENCOUNTER — Telehealth: Payer: Self-pay | Admitting: Pulmonary Disease

## 2011-10-05 ENCOUNTER — Other Ambulatory Visit: Payer: Self-pay | Admitting: Pulmonary Disease

## 2011-10-05 DIAGNOSIS — G4733 Obstructive sleep apnea (adult) (pediatric): Secondary | ICD-10-CM

## 2011-10-05 NOTE — Telephone Encounter (Signed)
Pt aware of recs. Dorethia Jeanmarie, CMA  

## 2011-10-05 NOTE — Telephone Encounter (Signed)
Pt reports that he is having diff with Cpap once it goes to highest auto setting (Ithink he is on 5-20).  Pt reports that when it gets to highest setting that air blow out the maks and he can't sleep.  Mask has been adjusted.  Pt reports that he sleeps great when he is at the midway setting and pt's wife reports no snoring at this setting.  Please advise on any adjustments to settings.

## 2011-10-05 NOTE — Telephone Encounter (Signed)
Let pt know that I have received his download, and that he needs a treatment pressure 19-20cm.  We can turn his pressure to 15 for 2 weeks, then 17 for 2 weeks, then 19 to see how he does.  I will send an order to [facility].

## 2011-10-09 ENCOUNTER — Other Ambulatory Visit: Payer: Self-pay | Admitting: Internal Medicine

## 2011-11-03 ENCOUNTER — Other Ambulatory Visit: Payer: Self-pay | Admitting: Internal Medicine

## 2011-11-09 ENCOUNTER — Encounter: Payer: Self-pay | Admitting: Internal Medicine

## 2011-11-09 ENCOUNTER — Telehealth: Payer: Self-pay | Admitting: Pulmonary Disease

## 2011-11-09 ENCOUNTER — Ambulatory Visit (INDEPENDENT_AMBULATORY_CARE_PROVIDER_SITE_OTHER): Payer: Medicare Other | Admitting: Internal Medicine

## 2011-11-09 VITALS — BP 134/84 | Temp 97.7°F | Wt 278.0 lb

## 2011-11-09 DIAGNOSIS — K130 Diseases of lips: Secondary | ICD-10-CM

## 2011-11-09 DIAGNOSIS — G4733 Obstructive sleep apnea (adult) (pediatric): Secondary | ICD-10-CM

## 2011-11-09 MED ORDER — NORTRIPTYLINE HCL 25 MG PO CAPS: 25.0000 mg | ORAL_CAPSULE | Freq: Every day | ORAL | Status: DC

## 2011-11-09 MED ORDER — ACYCLOVIR 800 MG PO TABS: 800.0000 mg | ORAL_TABLET | Freq: Three times a day (TID) | ORAL | Status: AC

## 2011-11-09 NOTE — Telephone Encounter (Signed)
Will forward this to [facility] so they can check, but I doubt they will do this.

## 2011-11-09 NOTE — Telephone Encounter (Signed)
I spoke with pt and he states he is currently set at pressure of 19 and can;t tolerate it. He states it's too much air blowing through the mask and air is seeping through it and sounds like a wind storm. Pt has been on this pressure since Friday. He states he did not have any problems with the machine was on 15 x 2 weeks and 17 x 2 weeks. Please advise KC, thanks

## 2011-11-09 NOTE — Telephone Encounter (Signed)
I spoke with pt and is aware of KC recs. Order has been sent

## 2011-11-09 NOTE — Telephone Encounter (Signed)
I spoke with pt and he is requesting an order be sent to [facility] to have his cpap checked at least once a month to make sure he is doing fine. I advised him not sure if insurance will cover this. Please advise CK thanks

## 2011-11-09 NOTE — Telephone Encounter (Signed)
Why dont we go back to 17 and see how he does.  Let me know if that is causing an issue as well.

## 2011-11-10 NOTE — Telephone Encounter (Signed)
Spoke to [employer]  med she has already spoke to pt about this and will be glad to do this for him he just wants to make sure his ahi is not going up [employer]

## 2011-11-10 NOTE — Progress Notes (Signed)
Patient ID: Wayne Booth, male   DOB: [birth year], 71 y.o.   MRN: [id number]  Blistered lips after prolonged sun exposure--- ongoing for 7 days. 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

## 2011-11-25 ENCOUNTER — Other Ambulatory Visit: Payer: Self-pay | Admitting: Internal Medicine

## 2012-01-11 ENCOUNTER — Other Ambulatory Visit: Payer: Self-pay | Admitting: Internal Medicine

## 2012-01-13 ENCOUNTER — Ambulatory Visit (HOSPITAL_COMMUNITY)
Admission: RE | Admit: 2012-01-13 | Discharge: 2012-01-13 | Disposition: A | Discharge status: Home / Self Care | Payer: Medicare Other | Source: Ambulatory Visit | Attending: Urology | Admitting: Urology

## 2012-01-13 ENCOUNTER — Other Ambulatory Visit (HOSPITAL_COMMUNITY): Payer: Self-pay | Admitting: Urology

## 2012-01-13 DIAGNOSIS — C649 Malignant neoplasm of unspecified kidney, except renal pelvis: Secondary | ICD-10-CM

## 2012-03-08 ENCOUNTER — Ambulatory Visit: Payer: Medicare Other | Admitting: Pulmonary Disease

## 2012-03-26 ENCOUNTER — Other Ambulatory Visit: Payer: Self-pay | Admitting: Internal Medicine

## 2012-03-29 ENCOUNTER — Ambulatory Visit (INDEPENDENT_AMBULATORY_CARE_PROVIDER_SITE_OTHER): Payer: Medicare Other

## 2012-03-29 DIAGNOSIS — Z23 Encounter for immunization: Secondary | ICD-10-CM

## 2012-03-30 ENCOUNTER — Ambulatory Visit (INDEPENDENT_AMBULATORY_CARE_PROVIDER_SITE_OTHER): Payer: Medicare Other | Admitting: Pulmonary Disease

## 2012-03-30 ENCOUNTER — Encounter: Payer: Self-pay | Admitting: Pulmonary Disease

## 2012-03-30 VITALS — BP 118/80 | HR 88 | Temp 97.8°F | Ht 71.5 in | Wt 276.4 lb

## 2012-03-30 DIAGNOSIS — G4733 Obstructive sleep apnea (adult) (pediatric): Secondary | ICD-10-CM

## 2012-03-30 NOTE — Progress Notes (Signed)
  Subjective:    Patient ID: Wayne Booth, male    DOB: Nov 26, 1940, 71 y.o.   MRN: [id number]  HPI The patient comes in today for followup of his known obstructive sleep apnea.  He is wearing CPAP compliantly, and has seen considerable improvement in his sleep and daytime alertness.  His optimal pressure is 19 cm of water, but he had poor tolerance of this.  We therefore settled on 17 cm, and he seems to be doing well with the ramp feature.  He feels that he sleeps well, and has adequate daytime alertness.  He has lost 8 pounds since last visit.   Review of Systems  Constitutional: Negative for fever, chills, diaphoresis, activity change, appetite change, fatigue and unexpected weight change.  HENT: Negative for nosebleeds, congestion, sore throat, rhinorrhea, trouble swallowing, dental problem, postnasal drip and sinus pressure.   Eyes: Negative for visual disturbance.  Respiratory: Negative for cough, chest tightness, shortness of breath and wheezing.   Cardiovascular: Negative for chest pain and palpitations.  Gastrointestinal: Negative for nausea, vomiting, abdominal pain and constipation.  Genitourinary: Negative for difficulty urinating.  Musculoskeletal: Negative for back pain and joint swelling.  Skin: Negative for rash.  Neurological: Negative for dizziness, syncope, weakness, light-headedness and headaches.  Hematological: Does not bruise/bleed easily.  Psychiatric/Behavioral: Negative for confusion, disturbed wake/sleep cycle and agitation. The patient is not nervous/anxious.        Objective:   Physical Exam Obese male in no acute distress No skin breakdown or pressure necrosis from the CPAP mask Lower extremities with mild edema, no cyanosis Alert and oriented, moves all 4 extremities.       Assessment & Plan:

## 2012-03-30 NOTE — Assessment & Plan Note (Signed)
The patient is doing well with CPAP at his current pressure of 17 cm of water.  He is sleeping well, and has excellent daytime alertness.  He is trying to work on weight loss, and has lost 8 pounds since last visit.  I have asked him to continue with his weight reduction, and to keep up with mask changes and supplies.

## 2012-03-30 NOTE — Patient Instructions (Addendum)
Continue with cpap, and keep up with mask changes and supplies. Continue working on weight loss followup with me in one year.

## 2012-05-16 ENCOUNTER — Telehealth: Payer: Self-pay | Admitting: Pulmonary Disease

## 2012-05-16 DIAGNOSIS — G4733 Obstructive sleep apnea (adult) (pediatric): Secondary | ICD-10-CM

## 2012-05-16 NOTE — Telephone Encounter (Signed)
lmomtcb x1 

## 2012-05-17 NOTE — Telephone Encounter (Signed)
Order sent over to [facility] to have patients CPAP pressure reduced to 14 cm. Left detailed message on patient vm.

## 2012-05-17 NOTE — Telephone Encounter (Signed)
We usually do not see significant changes in pressure needs except with increments of 20-25 pounds.  If he is having issues with tolerance, ok with me to turn down to 14.

## 2012-05-17 NOTE — Telephone Encounter (Signed)
I spoke with pt and he is wanting to reduce his pressure to 14 cm. He is currently set at 17 cm. He has lost weight-went from 283 to 270. Please advise Dr. Shelle Iron thanks

## 2012-05-17 NOTE — Telephone Encounter (Signed)
Pt called back.  Call him @ [phone]. Leanora Ivanoff

## 2012-05-20 ENCOUNTER — Other Ambulatory Visit: Payer: Self-pay | Admitting: Internal Medicine

## 2012-06-02 ENCOUNTER — Other Ambulatory Visit: Payer: Self-pay | Admitting: Internal Medicine

## 2012-06-08 ENCOUNTER — Other Ambulatory Visit: Payer: Self-pay | Admitting: Internal Medicine

## 2012-07-22 ENCOUNTER — Other Ambulatory Visit: Payer: Self-pay | Admitting: Internal Medicine

## 2012-08-17 ENCOUNTER — Other Ambulatory Visit: Payer: Self-pay | Admitting: Internal Medicine

## 2012-08-22 ENCOUNTER — Other Ambulatory Visit: Payer: Self-pay | Admitting: Internal Medicine

## 2012-08-23 ENCOUNTER — Other Ambulatory Visit: Payer: Self-pay | Admitting: Neurological Surgery

## 2012-08-23 ENCOUNTER — Encounter (HOSPITAL_COMMUNITY): Payer: Self-pay | Admitting: Pharmacy Technician

## 2012-08-26 ENCOUNTER — Telehealth: Payer: Self-pay | Admitting: Internal Medicine

## 2012-08-26 ENCOUNTER — Encounter (HOSPITAL_COMMUNITY): Payer: Self-pay

## 2012-08-26 ENCOUNTER — Encounter (HOSPITAL_COMMUNITY)
Admission: RE | Admit: 2012-08-26 | Discharge: 2012-08-26 | Disposition: A | Discharge status: Home / Self Care | Payer: Medicare Other | Source: Ambulatory Visit | Attending: Neurological Surgery | Admitting: Neurological Surgery

## 2012-08-26 HISTORY — DX: Unspecified osteoarthritis, unspecified site: M19.90

## 2012-08-26 HISTORY — DX: Anxiety disorder, unspecified: F41.9

## 2012-08-26 HISTORY — DX: Other disorders of pituitary gland: E23.6

## 2012-08-26 HISTORY — DX: Pneumonia, unspecified organism: J18.9

## 2012-08-26 LAB — CBC WITH DIFFERENTIAL/PLATELET
Basophils Absolute: 0 10*3/uL (ref 0.0–0.1)
Basophils Relative: 0 % (ref 0–1)
Eosinophils Relative: 3 % (ref 0–5)
Hemoglobin: 16.1 g/dL (ref 13.0–17.0)
Lymphocytes Relative: 21 % (ref 12–46)
Monocytes Absolute: 0.7 10*3/uL (ref 0.1–1.0)
Neutro Abs: 7.5 10*3/uL (ref 1.7–7.7)
Neutrophils Relative %: 69 % (ref 43–77)
Platelets: 269 10*3/uL (ref 150–400)
RBC: 5.02 MIL/uL (ref 4.22–5.81)
WBC: 10.8 10*3/uL — ABNORMAL HIGH (ref 4.0–10.5)

## 2012-08-26 LAB — BASIC METABOLIC PANEL
Calcium: 9.3 mg/dL (ref 8.4–10.5)
GFR calc Af Amer: 71 mL/min — ABNORMAL LOW (ref >90)
GFR calc non Af Amer: 61 mL/min — ABNORMAL LOW (ref >90)
Potassium: 4.4 mEq/L (ref 3.5–5.1)
Sodium: 137 mEq/L (ref 135–145)

## 2012-08-26 LAB — SURGICAL PCR SCREEN
MRSA, PCR: NEGATIVE
Staphylococcus aureus: NEGATIVE

## 2012-08-26 NOTE — Progress Notes (Signed)
Anesthesia chart review: Patient is a 72 year old male scheduled for C3-4 ACDF by Dr. Yetta Barre on 08/29/2012. His PAT appointment was earlier today.  I was not asked to evaluate him during his visit.  History includes morbid obesity (BMI 40.1), smoking, HTN, prostate cancer s/p radioactive seed implant, OSA with CPAP use (Dr. 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 PCP is Dr. Birdie Sons.  Preoperative labs noted.  CXR on 01/13/12 showed no acute or metastatic cardiopulmonary abnormality.  EKG on 08/26/12 showed NSR, low voltage QRS, cannot rule out anterior infarct (age undetermined), ST/T wave abnormality in the lateral leads (I, aVL), consider ischemia.  The lateral lead changes are appear new when compared to his last EKG in [facility] from 08/17/08.  He did have minimal ST abnormality in aVL on his EKG from 11/24/07.  He was evaluated at [facility] in 2008 following an abnormal Myoview suggesting mild reversible anterior defect and underwent a cardiac cath on 10/22/06 that showed normal coronary arteries, normal LV function with EF 70%.    No CV symptoms were documented at his PAT visit. I reviewed above history and EKGs with Anesthesiologist Dr. Gypsy Balsam.  Clinical correlation on the day of surgery.  If he remains asymptomatic from a CV standpoint then he can likely proceed as planned.  Shonna Chock, PA-C 08/26/12 1524

## 2012-08-26 NOTE — Telephone Encounter (Signed)
Pt is having surgery Mon, March 3 (neck fusion). Pt would like you to prescribe something for him to help him rest at night. Pt is scheduled for a second surgery (pituitary gland) to be determined after the first is done. Pharm: [employer]

## 2012-08-26 NOTE — Pre-Procedure Instructions (Signed)
Wayne Booth  08/26/2012   Your procedure is scheduled on:  Monday. March 3rd  Report to [facility] at 0530 AM.  Call this number if you have problems the morning of surgery: [phone]   Remember:   Do not eat food or drink liquids after midnight.    Take these medicines the morning of surgery with A SIP OF WATER: propranolol, paxil   Do not wear jewelry.  Do not wear lotions, powders, or perfumes, deodorant.  Do not shave 48 hours prior to surgery. Men may shave face and neck.  Do not bring valuables to the hospital.  Contacts, dentures or bridgework may not be worn into surgery.  Leave suitcase in the car. After surgery it may be brought to your room.  For patients admitted to the hospital, checkout time is 11:00 AM the day of discharge.   Patients discharged the day of surgery will not be allowed to drive home.    Special Instructions: Shower using CHG 2 nights before surgery and the night before surgery.  If you shower the day of surgery use CHG.  Use special wash - you have one bottle of CHG for all showers.  You should use approximately 1/3 of the bottle for each shower.   Please read over the following fact sheets that you were given: Pain Booklet, Coughing and Deep Breathing, MRSA Information and Surgical Site Infection Prevention

## 2012-08-26 NOTE — Progress Notes (Signed)
Primary Physician - Dr. Cato Mulligan Does not see a cardiologist anymore No recent cardiac testing

## 2012-08-28 MED ORDER — DEXTROSE 5 % IV SOLN
3.0000 g | INTRAVENOUS | Status: AC
  Administered 2012-08-29: 3 g via INTRAVENOUS
  Filled 2012-08-28: qty 3000

## 2012-08-29 ENCOUNTER — Encounter (HOSPITAL_COMMUNITY): Payer: Self-pay | Admitting: Vascular Surgery

## 2012-08-29 ENCOUNTER — Inpatient Hospital Stay (HOSPITAL_COMMUNITY): Payer: Medicare Other | Admitting: Anesthesiology

## 2012-08-29 ENCOUNTER — Inpatient Hospital Stay (HOSPITAL_COMMUNITY)
Admission: RE | Admit: 2012-08-29 | Discharge: 2012-08-30 | DRG: 473 | Disposition: A | Discharge status: Home / Self Care | Payer: Medicare Other | Source: Ambulatory Visit | Attending: Neurological Surgery | Admitting: Neurological Surgery

## 2012-08-29 ENCOUNTER — Encounter (HOSPITAL_COMMUNITY): Payer: Self-pay | Admitting: Neurological Surgery

## 2012-08-29 ENCOUNTER — Inpatient Hospital Stay (HOSPITAL_COMMUNITY): Payer: Medicare Other

## 2012-08-29 ENCOUNTER — Encounter (HOSPITAL_COMMUNITY)
Admission: RE | Disposition: A | Discharge status: Home / Self Care | Payer: Self-pay | Source: Ambulatory Visit | Attending: Neurological Surgery

## 2012-08-29 DIAGNOSIS — F411 Generalized anxiety disorder: Secondary | ICD-10-CM | POA: Diagnosis present

## 2012-08-29 DIAGNOSIS — M5020 Other cervical disc displacement, unspecified cervical region: Secondary | ICD-10-CM | POA: Diagnosis present

## 2012-08-29 DIAGNOSIS — Z981 Arthrodesis status: Secondary | ICD-10-CM

## 2012-08-29 DIAGNOSIS — G4733 Obstructive sleep apnea (adult) (pediatric): Secondary | ICD-10-CM | POA: Diagnosis present

## 2012-08-29 DIAGNOSIS — F17200 Nicotine dependence, unspecified, uncomplicated: Secondary | ICD-10-CM | POA: Diagnosis present

## 2012-08-29 DIAGNOSIS — Z87442 Personal history of urinary calculi: Secondary | ICD-10-CM

## 2012-08-29 DIAGNOSIS — M47812 Spondylosis without myelopathy or radiculopathy, cervical region: Principal | ICD-10-CM | POA: Diagnosis present

## 2012-08-29 DIAGNOSIS — Z905 Acquired absence of kidney: Secondary | ICD-10-CM

## 2012-08-29 DIAGNOSIS — I10 Essential (primary) hypertension: Secondary | ICD-10-CM | POA: Diagnosis present

## 2012-08-29 DIAGNOSIS — Z79899 Other long term (current) drug therapy: Secondary | ICD-10-CM

## 2012-08-29 DIAGNOSIS — Z85528 Personal history of other malignant neoplasm of kidney: Secondary | ICD-10-CM

## 2012-08-29 DIAGNOSIS — Z01812 Encounter for preprocedural laboratory examination: Secondary | ICD-10-CM

## 2012-08-29 DIAGNOSIS — Z8546 Personal history of malignant neoplasm of prostate: Secondary | ICD-10-CM

## 2012-08-29 DIAGNOSIS — Z96659 Presence of unspecified artificial knee joint: Secondary | ICD-10-CM

## 2012-08-29 HISTORY — PX: ANTERIOR CERVICAL DECOMP/DISCECTOMY FUSION: SHX1161

## 2012-08-29 SURGERY — ANTERIOR CERVICAL DECOMPRESSION/DISCECTOMY FUSION 1 LEVEL
Anesthesia: General | Site: Spine Cervical | Wound class: Clean

## 2012-08-29 MED ORDER — LIDOCAINE HCL 4 % MT SOLN
OROMUCOSAL | Status: DC | PRN
  Administered 2012-08-29: 4 mL via TOPICAL

## 2012-08-29 MED ORDER — MIDAZOLAM HCL 5 MG/5ML IJ SOLN
INTRAMUSCULAR | Status: DC | PRN
  Administered 2012-08-29: 1 mg via INTRAVENOUS

## 2012-08-29 MED ORDER — BUPIVACAINE HCL (PF) 0.25 % IJ SOLN
INTRAMUSCULAR | Status: DC | PRN
  Administered 2012-08-29: 6 mL

## 2012-08-29 MED ORDER — SODIUM CHLORIDE 0.9 % IR SOLN
Status: DC | PRN
  Administered 2012-08-29: 08:00:00

## 2012-08-29 MED ORDER — HYDROMORPHONE HCL PF 1 MG/ML IJ SOLN
INTRAMUSCULAR | Status: AC
  Filled 2012-08-29: qty 1

## 2012-08-29 MED ORDER — NEOSTIGMINE METHYLSULFATE 1 MG/ML IJ SOLN
INTRAMUSCULAR | Status: DC | PRN
  Administered 2012-08-29: 5 mg via INTRAVENOUS

## 2012-08-29 MED ORDER — THROMBIN 5000 UNITS EX SOLR
OROMUCOSAL | Status: DC | PRN
  Administered 2012-08-29: 09:00:00 via TOPICAL

## 2012-08-29 MED ORDER — DEXAMETHASONE 4 MG PO TABS
4.0000 mg | ORAL_TABLET | Freq: Four times a day (QID) | ORAL | Status: DC
  Administered 2012-08-29 – 2012-08-30 (×4): 4 mg via ORAL
  Filled 2012-08-29 (×8): qty 1

## 2012-08-29 MED ORDER — SODIUM CHLORIDE 0.9 % IJ SOLN: 3.0000 mL | INTRAMUSCULAR | Status: DC | PRN

## 2012-08-29 MED ORDER — PROPOFOL 10 MG/ML IV BOLUS
INTRAVENOUS | Status: DC | PRN
  Administered 2012-08-29: 190 mg via INTRAVENOUS

## 2012-08-29 MED ORDER — LIDOCAINE HCL (CARDIAC) 20 MG/ML IV SOLN
INTRAVENOUS | Status: DC | PRN
  Administered 2012-08-29: 100 mg via INTRAVENOUS

## 2012-08-29 MED ORDER — LACTATED RINGERS IV SOLN
INTRAVENOUS | Status: DC | PRN
  Administered 2012-08-29 (×2): via INTRAVENOUS

## 2012-08-29 MED ORDER — OXYCODONE HCL 5 MG/5ML PO SOLN: 5.0000 mg | Freq: Once | ORAL | Status: DC | PRN

## 2012-08-29 MED ORDER — ACETAMINOPHEN 650 MG RE SUPP: 650.0000 mg | RECTAL | Status: DC | PRN

## 2012-08-29 MED ORDER — DEXAMETHASONE SODIUM PHOSPHATE 10 MG/ML IJ SOLN
INTRAMUSCULAR | Status: AC
  Filled 2012-08-29: qty 1

## 2012-08-29 MED ORDER — ACETAMINOPHEN 325 MG PO TABS: 650.0000 mg | ORAL_TABLET | ORAL | Status: DC | PRN

## 2012-08-29 MED ORDER — DARIFENACIN HYDROBROMIDE ER 7.5 MG PO TB24
7.5000 mg | ORAL_TABLET | Freq: Every day | ORAL | Status: DC
  Administered 2012-08-29: 7.5 mg via ORAL
  Filled 2012-08-29 (×2): qty 1

## 2012-08-29 MED ORDER — PAROXETINE HCL 20 MG PO TABS
20.0000 mg | ORAL_TABLET | Freq: Every day | ORAL | Status: DC
  Administered 2012-08-29: 20 mg via ORAL
  Filled 2012-08-29 (×2): qty 1

## 2012-08-29 MED ORDER — ROCURONIUM BROMIDE 100 MG/10ML IV SOLN
INTRAVENOUS | Status: DC | PRN
  Administered 2012-08-29: 10 mg via INTRAVENOUS
  Administered 2012-08-29: 30 mg via INTRAVENOUS
  Administered 2012-08-29: 10 mg via INTRAVENOUS

## 2012-08-29 MED ORDER — THROMBIN 5000 UNITS EX SOLR
CUTANEOUS | Status: DC | PRN
  Administered 2012-08-29 (×2): 5000 [IU] via TOPICAL

## 2012-08-29 MED ORDER — HYDROMORPHONE HCL PF 1 MG/ML IJ SOLN
0.2500 mg | INTRAMUSCULAR | Status: DC | PRN
  Administered 2012-08-29 (×4): 0.5 mg via INTRAVENOUS

## 2012-08-29 MED ORDER — ARTIFICIAL TEARS OP OINT
TOPICAL_OINTMENT | OPHTHALMIC | Status: DC | PRN
  Administered 2012-08-29: 1 via OPHTHALMIC

## 2012-08-29 MED ORDER — CEFAZOLIN SODIUM 1-5 GM-% IV SOLN
1.0000 g | Freq: Three times a day (TID) | INTRAVENOUS | Status: AC
  Administered 2012-08-29 (×2): 1 g via INTRAVENOUS
  Filled 2012-08-29 (×2): qty 50

## 2012-08-29 MED ORDER — SUCCINYLCHOLINE CHLORIDE 20 MG/ML IJ SOLN
INTRAMUSCULAR | Status: DC | PRN
  Administered 2012-08-29: 100 mg via INTRAVENOUS

## 2012-08-29 MED ORDER — PROPRANOLOL HCL 10 MG PO TABS
10.0000 mg | ORAL_TABLET | Freq: Two times a day (BID) | ORAL | Status: DC
  Administered 2012-08-29 (×2): 10 mg via ORAL
  Filled 2012-08-29 (×4): qty 1

## 2012-08-29 MED ORDER — OXYCODONE HCL 5 MG PO TABS: 5.0000 mg | ORAL_TABLET | Freq: Once | ORAL | Status: DC | PRN

## 2012-08-29 MED ORDER — PHENYLEPHRINE HCL 10 MG/ML IJ SOLN
10.0000 mg | INTRAVENOUS | Status: DC | PRN
  Administered 2012-08-29: 20 ug/min via INTRAVENOUS

## 2012-08-29 MED ORDER — ONDANSETRON HCL 4 MG/2ML IJ SOLN
INTRAMUSCULAR | Status: DC | PRN
  Administered 2012-08-29: 4 mg via INTRAVENOUS

## 2012-08-29 MED ORDER — MENTHOL 3 MG MT LOZG
1.0000 | LOZENGE | OROMUCOSAL | Status: DC | PRN
  Filled 2012-08-29 (×2): qty 9

## 2012-08-29 MED ORDER — GLYCOPYRROLATE 0.2 MG/ML IJ SOLN
INTRAMUSCULAR | Status: DC | PRN
  Administered 2012-08-29: 0.6 mg via INTRAVENOUS

## 2012-08-29 MED ORDER — PHENOL 1.4 % MT LIQD
1.0000 | OROMUCOSAL | Status: DC | PRN
  Administered 2012-08-29: 1 via OROMUCOSAL
  Filled 2012-08-29: qty 177

## 2012-08-29 MED ORDER — HYDROCHLOROTHIAZIDE 25 MG PO TABS
25.0000 mg | ORAL_TABLET | Freq: Every day | ORAL | Status: DC
  Administered 2012-08-29: 25 mg via ORAL
  Filled 2012-08-29 (×2): qty 1

## 2012-08-29 MED ORDER — SODIUM CHLORIDE 0.9 % IJ SOLN
3.0000 mL | Freq: Two times a day (BID) | INTRAMUSCULAR | Status: DC
  Administered 2012-08-29 (×2): 3 mL via INTRAVENOUS

## 2012-08-29 MED ORDER — FENTANYL CITRATE 0.05 MG/ML IJ SOLN
INTRAMUSCULAR | Status: DC | PRN
  Administered 2012-08-29: 150 ug via INTRAVENOUS

## 2012-08-29 MED ORDER — POTASSIUM CHLORIDE IN NACL 20-0.9 MEQ/L-% IV SOLN
INTRAVENOUS | Status: DC
  Filled 2012-08-29 (×3): qty 1000

## 2012-08-29 MED ORDER — ACETAMINOPHEN 10 MG/ML IV SOLN
1000.0000 mg | Freq: Once | INTRAVENOUS | Status: AC
  Administered 2012-08-29: 1000 mg via INTRAVENOUS
  Filled 2012-08-29: qty 100

## 2012-08-29 MED ORDER — PROMETHAZINE HCL 25 MG/ML IJ SOLN: 6.2500 mg | INTRAMUSCULAR | Status: DC | PRN

## 2012-08-29 MED ORDER — OXYCODONE-ACETAMINOPHEN 5-325 MG PO TABS
1.0000 | ORAL_TABLET | ORAL | Status: DC | PRN
  Administered 2012-08-29 (×2): 2 via ORAL
  Filled 2012-08-29 (×2): qty 2

## 2012-08-29 MED ORDER — PHENYLEPHRINE HCL 10 MG/ML IJ SOLN
INTRAMUSCULAR | Status: DC | PRN
  Administered 2012-08-29 (×4): 80 ug via INTRAVENOUS

## 2012-08-29 MED ORDER — MORPHINE SULFATE 2 MG/ML IJ SOLN: 1.0000 mg | INTRAMUSCULAR | Status: DC | PRN

## 2012-08-29 MED ORDER — NORTRIPTYLINE HCL 25 MG PO CAPS
25.0000 mg | ORAL_CAPSULE | Freq: Every day | ORAL | Status: DC
  Administered 2012-08-29: 25 mg via ORAL
  Filled 2012-08-29 (×2): qty 1

## 2012-08-29 MED ORDER — ALBUMIN HUMAN 5 % IV SOLN
INTRAVENOUS | Status: DC | PRN
  Administered 2012-08-29: 09:00:00 via INTRAVENOUS

## 2012-08-29 MED ORDER — 0.9 % SODIUM CHLORIDE (POUR BTL) OPTIME
TOPICAL | Status: DC | PRN
  Administered 2012-08-29: 1000 mL

## 2012-08-29 MED ORDER — BACITRACIN 50000 UNITS IM SOLR
INTRAMUSCULAR | Status: AC
  Filled 2012-08-29: qty 1

## 2012-08-29 MED ORDER — LOSARTAN POTASSIUM 50 MG PO TABS
100.0000 mg | ORAL_TABLET | Freq: Every day | ORAL | Status: DC
  Administered 2012-08-29: 100 mg via ORAL
  Filled 2012-08-29 (×2): qty 2

## 2012-08-29 MED ORDER — ONDANSETRON HCL 4 MG/2ML IJ SOLN: 4.0000 mg | INTRAMUSCULAR | Status: DC | PRN

## 2012-08-29 MED ORDER — CYCLOBENZAPRINE HCL 10 MG PO TABS
10.0000 mg | ORAL_TABLET | Freq: Three times a day (TID) | ORAL | Status: DC | PRN
  Administered 2012-08-29 – 2012-08-30 (×2): 10 mg via ORAL
  Filled 2012-08-29 (×2): qty 1

## 2012-08-29 MED ORDER — DEXAMETHASONE SODIUM PHOSPHATE 10 MG/ML IJ SOLN
10.0000 mg | INTRAMUSCULAR | Status: AC
  Administered 2012-08-29: 10 mg via INTRAVENOUS

## 2012-08-29 MED ORDER — HEMOSTATIC AGENTS (NO CHARGE) OPTIME
TOPICAL | Status: DC | PRN
  Administered 2012-08-29: 1 via TOPICAL

## 2012-08-29 MED ORDER — SODIUM CHLORIDE 0.9 % IV SOLN
INTRAVENOUS | Status: AC
  Filled 2012-08-29: qty 500

## 2012-08-29 MED ORDER — DEXAMETHASONE SODIUM PHOSPHATE 4 MG/ML IJ SOLN
4.0000 mg | Freq: Four times a day (QID) | INTRAMUSCULAR | Status: DC
  Filled 2012-08-29 (×4): qty 1

## 2012-08-29 SURGICAL SUPPLY — 54 items
BAG DECANTER FOR FLEXI CONT (MISCELLANEOUS) ×2 IMPLANT
BENZOIN TINCTURE PRP APPL 2/3 (GAUZE/BANDAGES/DRESSINGS) ×2 IMPLANT
BUR MATCHSTICK NEURO 3.0 LAGG (BURR) ×2 IMPLANT
CAGE LORDOTIC 8 SM PLUS (Cage) ×2 IMPLANT
CANISTER SUCTION 2500CC (MISCELLANEOUS) ×2 IMPLANT
CLOTH BEACON ORANGE TIMEOUT ST (SAFETY) ×2 IMPLANT
CONT SPEC 4OZ CLIKSEAL STRL BL (MISCELLANEOUS) ×2 IMPLANT
DRAPE C-ARM 42X72 X-RAY (DRAPES) ×4 IMPLANT
DRAPE INCISE IOBAN 66X45 STRL (DRAPES) ×2 IMPLANT
DRAPE LAPAROTOMY 100X72 PEDS (DRAPES) ×2 IMPLANT
DRAPE MICROSCOPE LEICA (MISCELLANEOUS) ×2 IMPLANT
DRAPE MICROSCOPE ZEISS OPMI (DRAPES) ×2 IMPLANT
DRAPE POUCH INSTRU U-SHP 10X18 (DRAPES) ×2 IMPLANT
DRESSING TELFA 8X3 (GAUZE/BANDAGES/DRESSINGS) ×2 IMPLANT
DRILL BIT HELIX (BIT) ×2 IMPLANT
DRSG OPSITE 4X5.5 SM (GAUZE/BANDAGES/DRESSINGS) ×2 IMPLANT
DURAPREP 6ML APPLICATOR 50/CS (WOUND CARE) ×2 IMPLANT
ELECT COATED BLADE 2.86 ST (ELECTRODE) ×2 IMPLANT
ELECT REM PT RETURN 9FT ADLT (ELECTROSURGICAL) ×2
ELECTRODE REM PT RTRN 9FT ADLT (ELECTROSURGICAL) ×1 IMPLANT
GAUZE SPONGE 4X4 16PLY XRAY LF (GAUZE/BANDAGES/DRESSINGS) IMPLANT
GLOVE BIO SURGEON STRL SZ8 (GLOVE) ×2 IMPLANT
GLOVE BIOGEL PI IND STRL 7.0 (GLOVE) ×1 IMPLANT
GLOVE BIOGEL PI IND STRL 8.5 (GLOVE) ×1 IMPLANT
GLOVE BIOGEL PI INDICATOR 7.0 (GLOVE) ×1
GLOVE BIOGEL PI INDICATOR 8.5 (GLOVE) ×1
GLOVE ECLIPSE 8.5 STRL (GLOVE) ×2 IMPLANT
GLOVE SURG SS PI 7.0 STRL IVOR (GLOVE) ×2 IMPLANT
GOWN BRE IMP SLV AUR LG STRL (GOWN DISPOSABLE) IMPLANT
GOWN BRE IMP SLV AUR XL STRL (GOWN DISPOSABLE) ×4 IMPLANT
GOWN STRL REIN 2XL LVL4 (GOWN DISPOSABLE) ×2 IMPLANT
HEMOSTAT POWDER KIT SURGIFOAM (HEMOSTASIS) ×2 IMPLANT
HEMOSTAT POWDER SURGIFOAM 1G (HEMOSTASIS) ×2 IMPLANT
KIT BASIN OR (CUSTOM PROCEDURE TRAY) ×2 IMPLANT
KIT ROOM TURNOVER OR (KITS) ×2 IMPLANT
NEEDLE HYPO 25X1 1.5 SAFETY (NEEDLE) ×2 IMPLANT
NEEDLE SPNL 20GX3.5 QUINCKE YW (NEEDLE) ×2 IMPLANT
NS IRRIG 1000ML POUR BTL (IV SOLUTION) ×2 IMPLANT
PACK LAMINECTOMY NEURO (CUSTOM PROCEDURE TRAY) ×2 IMPLANT
PAD ARMBOARD 7.5X6 YLW CONV (MISCELLANEOUS) ×2 IMPLANT
PLATE HELIX R 22MM (Plate) ×2 IMPLANT
PUTTY BONE DBX 2.5 MIS (Bone Implant) ×2 IMPLANT
RUBBERBAND STERILE (MISCELLANEOUS) ×4 IMPLANT
SCREW 4.0X13 (Screw) ×4 IMPLANT
SCREW 4.0X13MM (Screw) ×4 IMPLANT
SPONGE INTESTINAL PEANUT (DISPOSABLE) ×2 IMPLANT
SPONGE SURGIFOAM ABS GEL SZ50 (HEMOSTASIS) ×2 IMPLANT
STRIP CLOSURE SKIN 1/2X4 (GAUZE/BANDAGES/DRESSINGS) ×2 IMPLANT
SUT VIC AB 3-0 SH 8-18 (SUTURE) ×2 IMPLANT
SYR 20ML ECCENTRIC (SYRINGE) ×2 IMPLANT
TOWEL OR 17X24 6PK STRL BLUE (TOWEL DISPOSABLE) ×2 IMPLANT
TOWEL OR 17X26 10 PK STRL BLUE (TOWEL DISPOSABLE) ×2 IMPLANT
TRAP SPECIMEN MUCOUS 40CC (MISCELLANEOUS) IMPLANT
WATER STERILE IRR 1000ML POUR (IV SOLUTION) ×2 IMPLANT

## 2012-08-29 NOTE — Progress Notes (Signed)
UR COMPLETED  

## 2012-08-29 NOTE — Transfer of Care (Signed)
Immediate Anesthesia Transfer of Care Note  Patient: Wayne Booth  Procedure(s) Performed: Procedure(s): ANTERIOR CERVICAL DECOMPRESSION/DISCECTOMY FUSION 1 LEVEL (N/A)  Patient Location: PACU  Anesthesia Type:General  Level of Consciousness: awake, alert , oriented and patient cooperative  Airway & Oxygen Therapy: Patient Spontanous Breathing and Patient connected to face mask oxygen  Post-op Assessment: Report given to PACU RN and Post -op Vital signs reviewed and stable  Post vital signs: Reviewed and stable  Complications: No apparent anesthesia complications

## 2012-08-29 NOTE — Progress Notes (Signed)
pts teeth returned 

## 2012-08-29 NOTE — Anesthesia Preprocedure Evaluation (Addendum)
Anesthesia Evaluation  Patient identified by MRN, date of birth, ID band Patient awake    Reviewed: Allergy & Precautions, H&P , NPO status , Patient's Chart, lab work & pertinent test results, reviewed documented beta blocker date and time   History of Anesthesia Complications Negative for: history of anesthetic complications  Airway Mallampati: II TM Distance: >3 FB Neck ROM: Full    Dental  (+) Teeth Intact, Dental Advisory Given and Edentulous Upper   Pulmonary sleep apnea and Continuous Positive Airway Pressure Ventilation , pneumonia -, resolved,  breath sounds clear to auscultation  Pulmonary exam normal       Cardiovascular hypertension, Pt. on medications and Pt. on home beta blockers Rhythm:Regular Rate:Normal     Neuro/Psych Anxiety negative neurological ROS     GI/Hepatic negative GI ROS, Neg liver ROS,   Endo/Other  Morbid obesity  Renal/GU Renal diseaseKidney removed 2008 - r/t cancer.     Musculoskeletal   Abdominal   Peds  Hematology negative hematology ROS (+)   Anesthesia Other Findings   Reproductive/Obstetrics Prostate cancer -- seeds placed 2010.                          Anesthesia Physical Anesthesia Plan  ASA: III  Anesthesia Plan: General   Post-op Pain Management:    Induction: Intravenous  Airway Management Planned: Oral ETT  Additional Equipment:   Intra-op Plan:   Post-operative Plan: Extubation in OR  Informed Consent: I have reviewed the patients History and Physical, chart, labs and discussed the procedure including the risks, benefits and alternatives for the proposed anesthesia with the patient or authorized representative who has indicated his/her understanding and acceptance.   Dental advisory given  Plan Discussed with: CRNA, Anesthesiologist and Surgeon  Anesthesia Plan Comments:         Anesthesia Quick Evaluation

## 2012-08-29 NOTE — Anesthesia Postprocedure Evaluation (Signed)
Anesthesia Post Note  Patient: Wayne Booth  Procedure(s) Performed: Procedure(s) (LRB): ANTERIOR CERVICAL DECOMPRESSION/DISCECTOMY FUSION 1 LEVEL (N/A)  Anesthesia type: general  Patient location: PACU  Post pain: Pain level controlled  Post assessment: Patient's Cardiovascular Status Stable  Last Vitals:  Filed Vitals:   08/29/12 1015  BP: 113/62  Pulse: 82  Temp:   Resp: 17    Post vital signs: Reviewed and stable  Level of consciousness: sedated  Complications: No apparent anesthesia complications

## 2012-08-29 NOTE — Progress Notes (Signed)
Rt has CPAP machine set up in PT room if needed. Pt home mask will not fit our machine so PT wants to continue to wear oxygen via nasal cannula unless he starts snoring. If PT starts snoring, RN will call RT to try one of our mask for PT. RT will continue to assist as needed.

## 2012-08-29 NOTE — H&P (Signed)
Subjective:   Patient is a 72 y.o. male admitted for ACDF C3-4. The patient first presented to me with complaints of neck and L shoulder pain. Onset of symptoms was weeks ago. The pain is described as neck and L shoulder pain that is throbbing and occurs constantly. The pain is rated severe, and is located at the L neck and radiates to the L shoulder. The symptoms have been progressive. Symptoms are exacerbated by neck movement, and are relieved by nothing.  Previous work up includes MRI which shows multilevel spondylosis..  Past Medical History  Diagnosis Date  . History of total knee replacement   . Hypertension   . Kidney stone   . Cancer   . Prostate cancer   . Kidney cancer, primary, with metastasis from kidney to other site   . Anxiety   . Pneumonia     hx of  . OSA (obstructive sleep apnea)     cpap at night  . Arthritis   . Pituitary cyst     Past Surgical History  Procedure Laterality Date  . Appendectomy    . Cholecystectomy    . Nephrectomy Left   . Insertion prostate radiation seed    . Replacement total knee bilateral    . Knee arthroscopy  2007    left    No Known Allergies  History  Substance Use Topics  . Smoking status: Current Some Day Smoker    Types: Cigars  . Smokeless tobacco: Never Used     Comment: 1-2 cigars per week  . Alcohol Use: 1.8 oz/week    1 Cans of beer, 2 Shots of liquor per week     Comment: socially    Family History  Problem Relation Age of Onset  . Cancer Father     ?mouth  . Cancer Mother     stomach  . Kidney cancer Sister   . Colon cancer Brother    Prior to Admission medications   Medication Sig Start Date End Date Taking? Authorizing Provider  cyclobenzaprine (FLEXERIL) 10 MG tablet Take 10 mg by mouth 3 (three) times daily as needed for muscle spasms.   Yes Historical Provider, MD  ENABLEX 15 MG 24 hr tablet Take 7.5 mg by mouth daily.  06/05/11  Yes Historical Provider, MD  hydrochlorothiazide (HYDRODIURIL) 25 MG  tablet Take 25 mg by mouth daily.   Yes Historical Provider, MD  losartan (COZAAR) 100 MG tablet Take 100 mg by mouth daily.   Yes Historical Provider, MD  nortriptyline (PAMELOR) 25 MG capsule Take 1 capsule (25 mg total) by mouth at bedtime. 11/09/11  Yes Bruce Romilda Garret, MD  PARoxetine (PAXIL) 20 MG tablet Take 20 mg by mouth daily.   Yes Historical Provider, MD  propranolol (INDERAL) 10 MG tablet Take 10 mg by mouth 2 (two) times daily.   Yes Historical Provider, MD     Review of Systems  Positive ROS: pituitary mass  All other systems have been reviewed and were otherwise negative with the exception of those mentioned in the HPI and as above.  Objective: Vital signs in last 24 hours:    General Appearance: Alert, cooperative, no distress, appears stated age Head: Normocephalic, without obvious abnormality, atraumatic Eyes: PERRL, conjunctiva/corneas clear, EOM's intact     Neck: Supple, symmetrical, trachea midline Lungs:  respirations unlabored Heart: Regular rate and rhythm Abdomen: Soft, non-tender Extremities: Extremities normal, atraumatic, no cyanosis or edema Pulses: 2+ and symmetric all extremities Skin: Skin color, texture, turgor  normal, no rashes or lesions  NEUROLOGIC:  Mental status: Alert and oriented x4, no aphasia, good attention span, fund of knowledge and memory  Motor Exam - grossly normal Sensory Exam - grossly normal Reflexes: 1+ Coordination - grossly normal Gait - grossly normal Balance - grossly normal Cranial Nerves: I: smell Not tested  II: visual acuity  OS: nl    OD: nl  II: visual fields Full to confrontation  II: pupils Equal, round, reactive to light  III,VII: ptosis None  III,IV,VI: extraocular muscles  Full ROM  V: mastication Normal  V: facial light touch sensation  Normal  V,VII: corneal reflex  Present  VII: facial muscle function - upper  Normal  VII: facial muscle function - lower Normal  VIII: hearing Not tested  IX: soft  palate elevation  Normal  IX,X: gag reflex Present  XI: trapezius strength  5/5  XI: sternocleidomastoid strength 5/5  XI: neck flexion strength  5/5  XII: tongue strength  Normal    Data Review Lab Results  Component Value Date   WBC 10.8* 08/26/2012   HGB 16.1 08/26/2012   HCT 44.6 08/26/2012   MCV 88.8 08/26/2012   PLT 269 08/26/2012   Lab Results  Component Value Date   NA 137 08/26/2012   K 4.4 08/26/2012   CL 98 08/26/2012   CO2 31 08/26/2012   BUN 21 08/26/2012   CREATININE 1.17 08/26/2012   GLUCOSE 139* 08/26/2012   Lab Results  Component Value Date   INR 0.95 08/26/2012    Assessment:   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 Patient has failed conservative therapy. Planned surgery : ACDF C3-4 with plate  Plan:   I explained the condition and procedure to the patient and answered any questions.  Patient wishes to proceed with procedure as planned. Understands risks/ benefits/ and expected or typical outcomes.  JONES,DAVID S 08/29/2012 5:21 AM

## 2012-08-29 NOTE — Anesthesia Procedure Notes (Signed)
Procedure Name: Intubation Date/Time: 08/29/2012 7:36 AM Performed by: Lovie Chol Pre-anesthesia Checklist: Patient identified, Emergency Drugs available, Suction available, Patient being monitored and Timeout performed Patient Re-evaluated:Patient Re-evaluated prior to inductionOxygen Delivery Method: Circle system utilized Preoxygenation: Pre-oxygenation with 100% oxygen Intubation Type: IV induction Ventilation: Mask ventilation without difficulty and Oral airway inserted - appropriate to patient size Laryngoscope Size: Miller and 2 Grade View: Grade I Tube type: Oral Tube size: 7.5 mm Number of attempts: 1 Airway Equipment and Method: Stylet Placement Confirmation: ETT inserted through vocal cords under direct vision,  breath sounds checked- equal and bilateral,  positive ETCO2 and CO2 detector Secured at: 21 cm Tube secured with: Tape Dental Injury: Teeth and Oropharynx as per pre-operative assessment

## 2012-08-29 NOTE — Plan of Care (Signed)
Problem: Consults Goal: Diagnosis - Spinal Surgery Outcome: Completed/Met Date Met:  08/29/12 Cervical Spine Fusion     

## 2012-08-29 NOTE — Preoperative (Signed)
Beta Blockers   Reason not to administer Beta Blockers:Not Applicable 

## 2012-08-29 NOTE — Op Note (Signed)
08/29/2012  9:16 AM  PATIENT:  Wayne Booth  72 y.o. 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 Decompressive anterior cervical discectomy C3-4, 2. Anterior cervical arthrodesis C3-4 utilizing a 8 mm peek interbody cage packed with local autograft and morcellized allograft, 3. Anterior cervical plating C3-4 utilizing a helix plate  SURGEON:  Marikay Alar, MD  ASSISTANTS: Dr. Danielle Dess  ANESTHESIA:   General  EBL: Less than 50 ml  Total I/O In: 770 [I.V.:750; IV Piggyback:20] Out: -   BLOOD ADMINISTERED:none  DRAINS: none   SPECIMEN:  No Specimen  INDICATION FOR PROCEDURE: This patient presented with severe neck and left shoulder pain. He failed medical management as well as epidural steroid injections. He had spondylosis at C3-4 with neural foraminal stenosis and edema within the facet at C3-4. Also had spondylosis at C5-6 and C6-7 but his lack of arm symptoms suggested that his symptoms are coming from C3-4. I recommended ACDF with plating at C3-4 hopes of improving his pain syndrome. Patient understood the risks, benefits, and alternatives and potential outcomes and wished to proceed.  PROCEDURE DETAILS: Patient was brought to the operating room placed under general endotracheal anesthesia. Patient was placed in the supine position on the operating room table. The neck was prepped with Duraprep and draped in a sterile fashion.   Three cc of local anesthesia was injected and a transverse incision was made on the right side of the neck.  Dissection was carried down thru the subcutaneous tissue and the platysma was  elevated, opened, and undermined with Metzenbaum scissors.  Dissection was then carried out thru an avascular plane leaving the sternocleidomastoid carotid artery and jugular vein laterally and the trachea and esophagus medially. The ventral aspect of the vertebral column was  identified and a localizing x-ray was taken. The C3-4 level was identified. The longus colli muscles were then elevated and the retractor was placed. The annulus was incised and the disc space entered. Discectomy was performed with micro-curettes and pituitary rongeurs. I then used the high-speed drill to drill the endplates down to the level of the posterior longitudinal ligament. The drill shavings were saved in a mucous trap for later arthrodesis. The operating microscope was draped and brought into the field provided additional magnification, illumination and visualization. Discectomy was continued posteriorly thru the disc space. Posterior longitudinal ligament was opened with a nerve hook, and then removed along with disc herniation and osteophytes, decompressing the spinal canal and thecal sac. We then continued to remove osteophytic overgrowth and disc material decompressing the neural foramina and exiting nerve roots bilaterally. The scope was angled up and down to help decompress and undercut the vertebral bodies. Once the decompression was completed we could pass a nerve hook circumferentially to assure adequate decompression in the midline and in the neural foramina. So by both visualization and palpation we felt we had an adequate decompression of the neural elements. We then measured the height of the intravertebral disc space and selected a 8 millimeter Peek interbody cage packed with autograft and morcellized allograft. It was then gently positioned in the intravertebral disc space and countersunk. I then used a helix plate and placed four variable angle screws into the vertebral bodies and locked them into position. The wound was irrigated with bacitracin solution, checked for hemostasis which was established and confirmed. Once meticulous hemostasis was achieved, we then proceeded with closure. The platysma was closed with interrupted  3-0 undyed Vicryl suture, the subcuticular layer was closed with  interrupted 3-0 undyed Vicryl suture. The skin edges were approximated with steristrips. The drapes were removed. A sterile dressing was applied. The patient was then awakened from general anesthesia and transferred to the recovery room in stable condition. At the end of the procedure all sponge, needle and instrument counts were correct.   PLAN OF CARE: Admit for overnight observation  PATIENT DISPOSITION:  PACU - hemodynamically stable.   Delay start of Pharmacological VTE agent (>24hrs) due to surgical blood loss or risk of bleeding:  yes

## 2012-08-30 ENCOUNTER — Encounter (HOSPITAL_COMMUNITY): Payer: Self-pay | Admitting: Neurological Surgery

## 2012-08-30 MED ORDER — OXYCODONE-ACETAMINOPHEN 5-325 MG PO TABS: 1.0000 | ORAL_TABLET | ORAL | Status: DC | PRN

## 2012-08-30 NOTE — Discharge Summary (Signed)
Physician Discharge Summary  Patient ID: Wayne Booth MRN: [id number] DOB/AGE: [birth year] 72 y.o.  Admit date: 08/29/2012 Discharge date: 08/30/2012  Admission Diagnoses: cervical spondylosis   Discharge Diagnoses: same   Discharged Condition: good  Hospital Course: The patient was admitted on 08/29/2012 and taken to the operating room where the patient underwent ACDF C3-4. The patient tolerated the procedure well and was taken to the recovery room and then to the floor in stable condition. The hospital course was routine. There were no complications. The wound remained clean dry and intact. Pt had appropriate neck soreness. No complaints of arm pain or new N/T/W. The patient remained afebrile with stable vital signs, and tolerated a regular diet. The patient continued to increase activities, and pain was well controlled with oral pain medications. Has had a resolution of his pre-op pain and seems pleased.  Consults: None  Significant Diagnostic Studies:  Results for orders placed during the hospital encounter of 08/26/12  SURGICAL PCR SCREEN      Result Value Range   MRSA, PCR NEGATIVE  NEGATIVE   Staphylococcus aureus NEGATIVE  NEGATIVE  BASIC METABOLIC PANEL      Result Value Range   Sodium 137  135 - 145 mEq/L   Potassium 4.4  3.5 - 5.1 mEq/L   Chloride 98  96 - 112 mEq/L   CO2 31  19 - 32 mEq/L   Glucose, Bld 139 (*) 70 - 99 mg/dL   BUN 21  6 - 23 mg/dL   Creatinine, Ser 4.09  0.50 - 1.35 mg/dL   Calcium 9.3  8.4 - 81.1 mg/dL   GFR calc non Af Amer 61 (*) >90 mL/min   GFR calc Af Amer 71 (*) >90 mL/min  CBC WITH DIFFERENTIAL      Result Value Range   WBC 10.8 (*) 4.0 - 10.5 K/uL   RBC 5.02  4.22 - 5.81 MIL/uL   Hemoglobin 16.1  13.0 - 17.0 g/dL   HCT 91.4  78.2 - 95.6 %   MCV 88.8  78.0 - 100.0 fL   MCH 32.1  26.0 - 34.0 pg   MCHC 36.1 (*) 30.0 - 36.0 g/dL   RDW 21.3  08.6 - 57.8 %   Platelets 269  150 - 400 K/uL   Neutrophils Relative 69  43 - 77 %   Neutro Abs  7.5  1.7 - 7.7 K/uL   Lymphocytes Relative 21  12 - 46 %   Lymphs Abs 2.3  0.7 - 4.0 K/uL   Monocytes Relative 7  3 - 12 %   Monocytes Absolute 0.7  0.1 - 1.0 K/uL   Eosinophils Relative 3  0 - 5 %   Eosinophils Absolute 0.3  0.0 - 0.7 K/uL   Basophils Relative 0  0 - 1 %   Basophils Absolute 0.0  0.0 - 0.1 K/uL  PROTIME-INR      Result Value Range   Prothrombin Time 12.6  11.6 - 15.2 seconds   INR 0.95  0.00 - 1.49    Dg Cervical Spine 1 View  08/29/2012  *RADIOLOGY REPORT*  Clinical Data: Neck pain.  DG C-ARM 1-60 MIN,DG CERVICAL SPINE - 1 VIEW  Comparison: None.  Findings: C-arm films document C3-C4 ACDF.  IMPRESSION: Satisfactory position and alignment.   Original Report Authenticated By: Davonna Belling, M.D.    Dg C-arm 1-60 Min  08/29/2012  *RADIOLOGY REPORT*  Clinical Data: Neck pain.  DG C-ARM 1-60 MIN,DG CERVICAL SPINE - 1 VIEW  Comparison: None.  Findings: C-arm films document C3-C4 ACDF.  IMPRESSION: Satisfactory position and alignment.   Original Report Authenticated By: Davonna Belling, M.D.     Antibiotics:  Anti-infectives   Start     Dose/Rate Route Frequency Ordered Stop   08/29/12 1400  ceFAZolin (ANCEF) IVPB 1 g/50 mL premix     1 g 100 mL/hr over 30 Minutes Intravenous Every 8 hours 08/29/12 1054 08/29/12 2214   08/29/12 0814  bacitracin 50,000 Units in sodium chloride irrigation 0.9 % 500 mL irrigation  Status:  Discontinued       As needed 08/29/12 0815 08/29/12 0925   08/29/12 0705  bacitracin [address] UNITS injection    Comments:  LOWERY, STEVEN: cabinet override      08/29/12 0705 08/29/12 1914   08/29/12 0600  ceFAZolin (ANCEF) 3 g in dextrose 5 % 50 mL IVPB     3 g 160 mL/hr over 30 Minutes Intravenous On call to O.R. 08/28/12 1324 08/29/12 0735      Discharge Exam: Blood pressure 135/88, pulse 84, temperature 97.8 F (36.6 C), temperature source Oral, resp. rate 16, SpO2 93.00%. Neurologic: Grossly normal Incision CDI  Discharge Medications:      Medication List    TAKE these medications       cyclobenzaprine 10 MG tablet  Commonly known as:  FLEXERIL  Take 10 mg by mouth 3 (three) times daily as needed for muscle spasms.     ENABLEX 15 MG 24 hr tablet  Generic drug:  darifenacin  Take 7.5 mg by mouth daily.     hydrochlorothiazide 25 MG tablet  Commonly known as:  HYDRODIURIL  Take 25 mg by mouth daily.     losartan 100 MG tablet  Commonly known as:  COZAAR  Take 100 mg by mouth daily.     nortriptyline 25 MG capsule  Commonly known as:  PAMELOR  Take 1 capsule (25 mg total) by mouth at bedtime.     oxyCODONE-acetaminophen 5-325 MG per tablet  Commonly known as:  PERCOCET/ROXICET  Take 1-2 tablets by mouth every 4 (four) hours as needed for pain.     PARoxetine 20 MG tablet  Commonly known as:  PAXIL  Take 20 mg by mouth daily.     propranolol 10 MG tablet  Commonly known as:  INDERAL  Take 10 mg by mouth 2 (two) times daily.        Disposition: home   Final Dx: ACDF C3-4      Discharge Orders   Future Appointments Provider Department Dept Phone   03/30/2013 9:00 AM Barbaraann Share, MD [facility] [phone]   Future Orders Complete By Expires     Call MD for:  difficulty breathing, headache or visual disturbances  As directed     Call MD for:  persistant nausea and vomiting  As directed     Call MD for:  redness, tenderness, or signs of infection (pain, swelling, redness, odor or green/yellow discharge around incision site)  As directed     Call MD for:  severe uncontrolled pain  As directed     Call MD for:  temperature >100.4  As directed     Diet - low sodium heart healthy  As directed     Discharge instructions  As directed     Comments:      No heavy lifting, may shower normally, no driving for 1 week    Increase activity slowly  As directed  Remove dressing in 48 hours  As directed        Follow-up Information   Follow up with JONES,DAVID S, MD In 2 weeks.   Contact  information:   [address] [place] [address] [phone]        Signed: Tia Alert 08/30/2012, 7:24 AM

## 2012-09-21 ENCOUNTER — Encounter: Payer: Self-pay | Admitting: Family Medicine

## 2012-09-21 ENCOUNTER — Ambulatory Visit (INDEPENDENT_AMBULATORY_CARE_PROVIDER_SITE_OTHER): Payer: Medicare Other | Admitting: Family Medicine

## 2012-09-21 VITALS — BP 120/70 | Temp 98.3°F | Wt 283.0 lb

## 2012-09-21 DIAGNOSIS — G4700 Insomnia, unspecified: Secondary | ICD-10-CM

## 2012-09-21 DIAGNOSIS — F5104 Psychophysiologic insomnia: Secondary | ICD-10-CM

## 2012-09-21 DIAGNOSIS — R5381 Other malaise: Secondary | ICD-10-CM

## 2012-09-21 NOTE — Progress Notes (Signed)
  Subjective:    Patient ID: Wayne Booth, male    DOB: 01-06-1941, 72 y.o.   MRN: [id number]  HPI  Sleep difficulty for several years OSA and uses CPAP. Usually takes one our to go to sleep. Usual 7-8 hours per night. On Pamelor 25 mg nightly for years. No ETOH regularly.  No signifi caffeine. Minimal episodes of wakefulness after going to bed.  More than anything has increased fatigue.  History of hypertension ,obesity, obstructive sleep apnea, essential tremor, past history of prostate and renal cell cancer.   Past Medical History  Diagnosis Date  . History of total knee replacement   . Hypertension   . Kidney stone   . Cancer   . Prostate cancer   . Kidney cancer, primary, with metastasis from kidney to other site   . Anxiety   . Pneumonia     hx of  . OSA (obstructive sleep apnea)     cpap at night  . Arthritis   . Pituitary cyst    Past Surgical History  Procedure Laterality Date  . Appendectomy    . Cholecystectomy    . Nephrectomy Left   . Insertion prostate radiation seed    . Replacement total knee bilateral    . Knee arthroscopy  2007    left  . Anterior cervical decomp/discectomy fusion N/A 08/29/2012    Procedure: ANTERIOR CERVICAL DECOMPRESSION/DISCECTOMY FUSION 1 LEVEL;  Surgeon: Tia Alert, MD;  Location: [facility];  Service: Neurosurgery;  Laterality: N/A;    reports that he has been smoking Cigars.  He has never used smokeless tobacco. He reports that he drinks about 1.8 ounces of alcohol per week. His drug history is not on file. family history includes Cancer in his father and mother; Colon cancer in his brother; and Kidney cancer in his sister. No Known Allergies    Review of Systems  Constitutional: Positive for fatigue. Negative for appetite change and unexpected weight change.  HENT: Negative for trouble swallowing and voice change.   Respiratory: Negative for cough and shortness of breath.   Cardiovascular: Negative for chest  pain, palpitations and leg swelling.  Gastrointestinal: Negative for nausea, vomiting, abdominal pain and blood in stool.  Genitourinary: Negative for dysuria.  Hematological: Negative for adenopathy. Does not bruise/bleed easily.  Psychiatric/Behavioral: Negative for dysphoric mood.       Objective:   Physical Exam  Constitutional: He appears well-developed and well-nourished.  HENT:  Right Ear: External ear normal.  Left Ear: External ear normal.  Mouth/Throat: Oropharynx is clear and moist.  Neck: Neck supple. No thyromegaly present.  Cardiovascular: Normal rate and regular rhythm.   Pulmonary/Chest: Effort normal and breath sounds normal. No respiratory distress. He has no wheezes. He has no rales.  Musculoskeletal: He exhibits no edema.          Assessment & Plan:   #1 fatigue. Likely multifactorial. Obstructive sleep apnea - treated with CPAP. Long history of chronic insomnia but generally sleeps fairly well with Pamelor. No consistent exercise. Recent CBC and basic metabolic panel unremarkable. Discussed things like hypergonadism but is not sure he would wish treatment even if he had low testosterone. Check TSH. If normal, focus on more consistent exercise and weight loss #2 chronic insomnia. No regular alcohol use. No consistent caffeine use. Continue Pamelor. Sleep hygiene discussed

## 2012-09-22 NOTE — Progress Notes (Signed)
Quick Note:  Pt informed on personally identified VM ______ 

## 2012-10-20 ENCOUNTER — Telehealth: Payer: Self-pay | Admitting: Internal Medicine

## 2012-10-20 DIAGNOSIS — R634 Abnormal weight loss: Secondary | ICD-10-CM

## 2012-10-20 NOTE — Telephone Encounter (Signed)
PT called and stated that he is scheduling a surgery next week and is required to see someone regarding his weight. He states that he is morbidly obese and would like to be referred to see someone for help. Please assist.

## 2012-10-21 NOTE — Telephone Encounter (Signed)
Left message for pt to call back  °

## 2012-10-22 NOTE — Telephone Encounter (Signed)
Refer nutritionist

## 2012-10-24 NOTE — Telephone Encounter (Signed)
Referral order placed, pt aware 

## 2012-10-25 ENCOUNTER — Other Ambulatory Visit: Payer: Self-pay | Admitting: Neurosurgery

## 2012-10-31 ENCOUNTER — Ambulatory Visit (INDEPENDENT_AMBULATORY_CARE_PROVIDER_SITE_OTHER): Payer: Medicare Other | Admitting: Family

## 2012-10-31 ENCOUNTER — Telehealth: Payer: Self-pay | Admitting: Internal Medicine

## 2012-10-31 ENCOUNTER — Encounter (HOSPITAL_COMMUNITY): Payer: Self-pay

## 2012-10-31 ENCOUNTER — Encounter: Payer: Self-pay | Admitting: Family

## 2012-10-31 VITALS — BP 138/84 | HR 106 | Wt 285.5 lb

## 2012-10-31 DIAGNOSIS — L578 Other skin changes due to chronic exposure to nonionizing radiation: Secondary | ICD-10-CM

## 2012-10-31 DIAGNOSIS — B009 Herpesviral infection, unspecified: Secondary | ICD-10-CM

## 2012-10-31 DIAGNOSIS — B001 Herpesviral vesicular dermatitis: Secondary | ICD-10-CM

## 2012-10-31 DIAGNOSIS — J309 Allergic rhinitis, unspecified: Secondary | ICD-10-CM

## 2012-10-31 MED ORDER — FLUTICASONE PROPIONATE 50 MCG/ACT NA SUSP: 2.0000 | Freq: Every day | NASAL | Status: DC

## 2012-10-31 MED ORDER — TRIAMCINOLONE 0.1 % CREAM:EUCERIN CREAM 1:1: 1.0000 "application " | TOPICAL_CREAM | Freq: Two times a day (BID) | CUTANEOUS | Status: DC

## 2012-10-31 MED ORDER — MAGIC MOUTHWASH: 5.0000 mL | Freq: Four times a day (QID) | ORAL | Status: DC | PRN

## 2012-10-31 NOTE — Patient Instructions (Addendum)
WHERE SUNCREEN!!

## 2012-10-31 NOTE — Pre-Procedure Instructions (Signed)
TAVI HOOGENDOORN  10/31/2012   Your procedure is scheduled on:  Tuesday, May 13th   Report to [facility] at 5:30 AM.             ( You will come in through the [address] entrance and follow signs to EAST Elevators--              Take these to the 3rd Floor...)  Call this number if you have problems the morning of surgery: [phone]   Remember:   Do not eat food or drink liquids after midnight Monday.   Take these medicines the morning of surgery with A SIP OF WATER: Ditropan, Oxycodone, Paxil   Do not wear jewelry, rings or watches.  Do not wear lotions, powders, or colognes. You may  NOT wear deodorant.   Men may shave face and neck.   Do not bring valuables to the hospital.  Contacts, dentures or bridgework may not be worn into surgery.   Leave suitcase in the car. After surgery it may be brought to your room.  For patients admitted to the hospital, checkout time is 11:00 AM the day of discharge.   Name and phone number of your driver:    Special Instructions: Shower using CHG 2 nights before surgery and the night before surgery.  If you shower the day of surgery use CHG.  Use special wash - you have one bottle of CHG for all showers.  You should use approximately 1/3 of the bottle for each shower.   Please read over the following fact sheets that you were given: Pain Booklet, Coughing and Deep Breathing, Blood Transfusion Information, MRSA Information and Surgical Site Infection Prevention

## 2012-10-31 NOTE — Telephone Encounter (Signed)
Patient Information:  Caller Name: Josyah  Phone: [phone]  Patient: Wayne Booth, Wayne Booth  Gender: Male  DOB: 1940-07-05  Age: 72 Years  PCP: Birdie Sons (Adults only)   Does the office need to follow up with this patient?: No  RN Note:  emergent call, requesting medication, appt advised   Reason For Call & Symptoms: emergent call, sores in mouth, surgery scheduled for 11/08/12  Reviewed Health History In EMR: Yes  Reviewed Medications In EMR: Yes  Reviewed Allergies In EMR: Yes  Reviewed Surgeries / Procedures: Yes  Date of Onset of Symptoms: 10/28/2012  Guideline(s) Used: Mouth Injury  Disposition Per Guideline:  See Today in Office  Reason For Disposition Reached:  Patient wants to be seen  Advice Given:  Call Back If:  You become worse.  Patient Will Follow Care Advice:  YES  Appointment Scheduled:  10/31/2012 15:45:00; Provider:  Adline Mango

## 2012-11-01 ENCOUNTER — Encounter (HOSPITAL_COMMUNITY)
Admission: RE | Admit: 2012-11-01 | Discharge: 2012-11-01 | Disposition: A | Discharge status: Home / Self Care | Payer: Medicare Other | Source: Ambulatory Visit | Attending: Neurosurgery | Admitting: Neurosurgery

## 2012-11-01 ENCOUNTER — Encounter (HOSPITAL_COMMUNITY): Payer: Self-pay

## 2012-11-01 DIAGNOSIS — Z01818 Encounter for other preprocedural examination: Secondary | ICD-10-CM | POA: Insufficient documentation

## 2012-11-01 DIAGNOSIS — Z01812 Encounter for preprocedural laboratory examination: Secondary | ICD-10-CM | POA: Insufficient documentation

## 2012-11-01 LAB — COMPREHENSIVE METABOLIC PANEL
ALT: 41 U/L (ref 0–53)
AST: 37 U/L (ref 0–37)
Alkaline Phosphatase: 87 U/L (ref 39–117)
CO2: 29 mEq/L (ref 19–32)
Calcium: 9.4 mg/dL (ref 8.4–10.5)
Chloride: 96 mEq/L (ref 96–112)
GFR calc Af Amer: 73 mL/min — ABNORMAL LOW (ref >90)
GFR calc non Af Amer: 63 mL/min — ABNORMAL LOW (ref >90)
Glucose, Bld: 111 mg/dL — ABNORMAL HIGH (ref 70–99)
Potassium: 4 mEq/L (ref 3.5–5.1)
Sodium: 134 mEq/L — ABNORMAL LOW (ref 135–145)
Total Bilirubin: 0.4 mg/dL (ref 0.3–1.2)

## 2012-11-01 LAB — CBC
Hemoglobin: 15.9 g/dL (ref 13.0–17.0)
MCH: 30.8 pg (ref 26.0–34.0)
RBC: 5.17 MIL/uL (ref 4.22–5.81)
WBC: 9.9 10*3/uL (ref 4.0–10.5)

## 2012-11-01 LAB — SURGICAL PCR SCREEN: MRSA, PCR: NEGATIVE

## 2012-11-01 NOTE — Progress Notes (Signed)
SAW LEBAUERS' COUPLE YRS AGO, WHEN HE WENT IN FOR KNEE SURGERY...NOTES IN EPIC AND INSIDE CHART........da

## 2012-11-01 NOTE — Progress Notes (Signed)
Subjective:    Patient ID: Wayne Booth, male    DOB: Apr 27, 1941, 72 y.o.   MRN: [id number]  HPI 72 year old white male, nonsmoker, patient of Dr. Cato Mulligan is in today with c/o a sore in his mouth x 3-4 days that is burning and tingling. Has been trying OTC medications with no relief. Has a history of herpes simplex.   Also has concerns of increased sneezing, coughing, and congestion x 1 week. Has been taking OTC Claritin with not much relief. No known environmental allergens.   Has concerns of itchy, dry skin to the arms bilaterally. 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 Sees dermatology regularly for skin checks. No bleeding from the skin or obvious lesion changes.    Review of Systems  Constitutional: Negative.   HENT: Positive for congestion, sneezing, mouth sores and postnasal drip.   Respiratory: Negative.   Cardiovascular: Negative.   Skin: Positive for rash.       Dry skin to the forearms bilaterally.  Neurological: Negative.   Psychiatric/Behavioral: Negative.    Past Medical History  Diagnosis Date  . History of total knee replacement   . Hypertension   . Kidney stone   . Cancer   . Prostate cancer   . Kidney cancer, primary, with metastasis from kidney to other site   . Anxiety   . Pneumonia     hx of  . OSA (obstructive sleep apnea)     cpap at night  . Arthritis   . Pituitary cyst     History   Social History  . Marital Status: Married    Spouse Name: Randa Evens    Number of Children: Y  . Years of Education: N/A   Occupational History  . retired Surveyor, mining    Social History Main Topics  . Smoking status: Current Some Day Smoker    Types: Cigars  . Smokeless tobacco: Never Used     Comment: 1-2 cigars per week  . Alcohol Use: 1.8 oz/week    1 Cans of beer, 2 Shots of liquor per week     Comment: socially  . Drug Use: Not on file  . Sexually Active: Not on file   Other Topics  Concern  . Not on file   Social History Narrative  . No narrative on file    Past Surgical History  Procedure Laterality Date  . Appendectomy    . Cholecystectomy    . Nephrectomy Left   . Insertion prostate radiation seed    . Replacement total knee bilateral    . Knee arthroscopy  2007    left  . Anterior cervical decomp/discectomy fusion N/A 08/29/2012    Procedure: ANTERIOR CERVICAL DECOMPRESSION/DISCECTOMY FUSION 1 LEVEL;  Surgeon: Tia Alert, MD;  Location: [facility];  Service: Neurosurgery;  Laterality: N/A;    Family History  Problem Relation Age of Onset  . Cancer Father     ?mouth  . Cancer Mother     stomach  . Kidney cancer Sister   . Colon cancer Brother     No Known Allergies  Current Outpatient Prescriptions on File Prior to Visit  Medication Sig Dispense Refill  . hydrochlorothiazide (HYDRODIURIL) 25 MG tablet Take 25 mg by mouth daily.      Marland Kitchen losartan (COZAAR) 100 MG tablet Take 100 mg by mouth daily.      . nortriptyline (PAMELOR) 25 MG capsule Take  1 capsule (25 mg total) by mouth at bedtime.  90 capsule  3  . PARoxetine (PAXIL) 20 MG tablet Take 20 mg by mouth daily.       No current facility-administered medications on file prior to visit.    BP 138/84  Pulse 106  Wt 285 lb 8 oz (129.502 kg)  BMI 39.84 kg/m2  SpO2 97%chart    Objective:   Physical Exam  Constitutional: He is oriented to person, place, and time. He appears well-developed and well-nourished.  HENT:  Right Ear: External ear normal.  Left Ear: External ear normal.  Nose: Nose normal.  Mouth/Throat: Oropharynx is clear and moist.  Herpetic lesion to the inside of right cheek. Tender to touch.    Neck: Normal range of motion. Neck supple.  Cardiovascular: Normal rate, regular rhythm and normal heart sounds.   Pulmonary/Chest: Effort normal and breath sounds normal.  Abdominal: Soft. Bowel sounds are normal.  Neurological: He is alert and oriented to person, place, and  time.  Skin: Skin is warm and dry. Rash noted.  Significant sun damaged skin to the forearms bilaterally. Skin is dry and flaky. No bleeding. No obvious skin malformations of skin cancers  Psychiatric: He has a normal mood and affect.          Assessment & Plan:  Assessment:  1. Herpes Simplex 2. Allergic Rhinitis 3. Sun-damage skin  Plan: Miracle Mouthwash gargle, swish, and swallow 4 times a day. Patient declined oral agent. Zyrtec once a day OTC. Sunscreen applied to the skin daily. Hydrocortisone and Eucerin cream twice a day. Call the office if symptoms worsen or persist. Recheck as scheduled and as needed.

## 2012-11-07 MED ORDER — DEXTROSE 5 % IV SOLN
3.0000 g | INTRAVENOUS | Status: AC
  Administered 2012-11-08: 3 g via INTRAVENOUS
  Filled 2012-11-07: qty 3000

## 2012-11-08 ENCOUNTER — Inpatient Hospital Stay (HOSPITAL_COMMUNITY): Payer: Medicare Other | Admitting: Certified Registered"

## 2012-11-08 ENCOUNTER — Encounter (HOSPITAL_COMMUNITY)
Admission: RE | Disposition: A | Discharge status: Home / Self Care | Payer: Self-pay | Source: Ambulatory Visit | Attending: Neurosurgery

## 2012-11-08 ENCOUNTER — Inpatient Hospital Stay (HOSPITAL_COMMUNITY)
Admission: RE | Admit: 2012-11-08 | Discharge: 2012-11-11 | DRG: 615 | Disposition: A | Discharge status: Home / Self Care | Payer: Medicare Other | Source: Ambulatory Visit | Attending: Neurosurgery | Admitting: Neurosurgery

## 2012-11-08 ENCOUNTER — Encounter (HOSPITAL_COMMUNITY): Payer: Self-pay | Admitting: *Deleted

## 2012-11-08 ENCOUNTER — Inpatient Hospital Stay (HOSPITAL_COMMUNITY): Payer: Medicare Other

## 2012-11-08 ENCOUNTER — Encounter (HOSPITAL_COMMUNITY): Payer: Self-pay | Admitting: Certified Registered"

## 2012-11-08 DIAGNOSIS — Z9989 Dependence on other enabling machines and devices: Secondary | ICD-10-CM

## 2012-11-08 DIAGNOSIS — D352 Benign neoplasm of pituitary gland: Principal | ICD-10-CM | POA: Diagnosis present

## 2012-11-08 DIAGNOSIS — Z7982 Long term (current) use of aspirin: Secondary | ICD-10-CM

## 2012-11-08 DIAGNOSIS — Z96659 Presence of unspecified artificial knee joint: Secondary | ICD-10-CM

## 2012-11-08 DIAGNOSIS — F17200 Nicotine dependence, unspecified, uncomplicated: Secondary | ICD-10-CM | POA: Diagnosis present

## 2012-11-08 DIAGNOSIS — Z79899 Other long term (current) drug therapy: Secondary | ICD-10-CM

## 2012-11-08 DIAGNOSIS — K5900 Constipation, unspecified: Secondary | ICD-10-CM | POA: Diagnosis present

## 2012-11-08 DIAGNOSIS — I10 Essential (primary) hypertension: Secondary | ICD-10-CM | POA: Diagnosis present

## 2012-11-08 DIAGNOSIS — G4733 Obstructive sleep apnea (adult) (pediatric): Secondary | ICD-10-CM

## 2012-11-08 DIAGNOSIS — Z8546 Personal history of malignant neoplasm of prostate: Secondary | ICD-10-CM

## 2012-11-08 DIAGNOSIS — J9801 Acute bronchospasm: Secondary | ICD-10-CM

## 2012-11-08 DIAGNOSIS — D353 Benign neoplasm of craniopharyngeal duct: Principal | ICD-10-CM | POA: Diagnosis present

## 2012-11-08 DIAGNOSIS — R0902 Hypoxemia: Secondary | ICD-10-CM

## 2012-11-08 HISTORY — PX: CRANIOTOMY: SHX93

## 2012-11-08 LAB — POCT URINE SPECIFIC GRAVITY, REFRACTOMETER
Specific gravity, refractometer-urine: 1.0094 (ref 1.001–1.035)
Specific gravity, refractometer-urine: 1.009 (ref 1.001–1.035)
Specific gravity, refractometer-urine: 1.099 — AB (ref 1.001–1.035)
Specific gravity, refractometer-urine: 1.01 (ref 1.001–1.035)
Specific gravity, refractometer-urine: 1.01 (ref 1.001–1.035)
Specific gravity, refractometer-urine: 1.0109 (ref 1.001–1.035)
Specific gravity, refractometer-urine: 1.0109 (ref 1.001–1.035)
Specific gravity, refractometer-urine: 1.01 (ref 1.001–1.035)
Specific gravity, refractometer-urine: 1.011 (ref 1.001–1.035)

## 2012-11-08 LAB — CBC
Hemoglobin: 15.4 g/dL (ref 13.0–17.0)
RBC: 4.94 MIL/uL (ref 4.22–5.81)
WBC: 21.6 10*3/uL — ABNORMAL HIGH (ref 4.0–10.5)

## 2012-11-08 LAB — BASIC METABOLIC PANEL
GFR calc Af Amer: 77 mL/min — ABNORMAL LOW (ref >90)
GFR calc non Af Amer: 67 mL/min — ABNORMAL LOW (ref >90)
Glucose, Bld: 176 mg/dL — ABNORMAL HIGH (ref 70–99)
Potassium: 4.4 mEq/L (ref 3.5–5.1)
Sodium: 136 mEq/L (ref 135–145)

## 2012-11-08 SURGERY — CRANIOTOMY HYPOPHYSECTOMY TRANSNASAL APPROACH
Anesthesia: General | Wound class: Clean Contaminated

## 2012-11-08 MED ORDER — LOSARTAN POTASSIUM 50 MG PO TABS
100.0000 mg | ORAL_TABLET | Freq: Every day | ORAL | Status: DC
  Administered 2012-11-09 – 2012-11-11 (×3): 100 mg via ORAL
  Filled 2012-11-08 (×3): qty 2

## 2012-11-08 MED ORDER — NEOSTIGMINE METHYLSULFATE 1 MG/ML IJ SOLN
INTRAMUSCULAR | Status: DC | PRN
  Administered 2012-11-08: 5 mg via INTRAVENOUS

## 2012-11-08 MED ORDER — BIOTENE DRY MOUTH MT LIQD
15.0000 mL | Freq: Two times a day (BID) | OROMUCOSAL | Status: DC
  Administered 2012-11-08 – 2012-11-11 (×6): 15 mL via OROMUCOSAL

## 2012-11-08 MED ORDER — LIDOCAINE HCL (CARDIAC) 20 MG/ML IV SOLN
INTRAVENOUS | Status: DC | PRN
  Administered 2012-11-08: 60 mg via INTRAVENOUS

## 2012-11-08 MED ORDER — DEXAMETHASONE SODIUM PHOSPHATE 4 MG/ML IJ SOLN
4.0000 mg | Freq: Three times a day (TID) | INTRAMUSCULAR | Status: DC
  Administered 2012-11-10 – 2012-11-11 (×4): 4 mg via INTRAVENOUS
  Filled 2012-11-08 (×8): qty 1

## 2012-11-08 MED ORDER — PROMETHAZINE HCL 25 MG PO TABS: 12.5000 mg | ORAL_TABLET | ORAL | Status: DC | PRN

## 2012-11-08 MED ORDER — CEFAZOLIN SODIUM-DEXTROSE 2-3 GM-% IV SOLR
2.0000 g | Freq: Three times a day (TID) | INTRAVENOUS | Status: AC
  Administered 2012-11-08 – 2012-11-09 (×4): 2 g via INTRAVENOUS
  Filled 2012-11-08 (×4): qty 50

## 2012-11-08 MED ORDER — PANTOPRAZOLE SODIUM 40 MG IV SOLR
40.0000 mg | Freq: Every day | INTRAVENOUS | Status: DC
  Administered 2012-11-08: 40 mg via INTRAVENOUS
  Filled 2012-11-08 (×2): qty 40

## 2012-11-08 MED ORDER — PHENYLEPHRINE HCL 10 MG/ML IJ SOLN
30.0000 ug/min | INTRAVENOUS | Status: AC
  Administered 2012-11-08: 100 ug/min via INTRAVENOUS
  Filled 2012-11-08: qty 2

## 2012-11-08 MED ORDER — FENTANYL CITRATE 0.05 MG/ML IJ SOLN
INTRAMUSCULAR | Status: DC | PRN
  Administered 2012-11-08: 150 ug via INTRAVENOUS

## 2012-11-08 MED ORDER — BACITRACIN ZINC 500 UNIT/GM EX OINT
TOPICAL_OINTMENT | CUTANEOUS | Status: DC | PRN
  Administered 2012-11-08: 1 via TOPICAL

## 2012-11-08 MED ORDER — MIDAZOLAM HCL 5 MG/5ML IJ SOLN
INTRAMUSCULAR | Status: DC | PRN
  Administered 2012-11-08: 1 mg via INTRAVENOUS

## 2012-11-08 MED ORDER — FUROSEMIDE 10 MG/ML IJ SOLN
INTRAMUSCULAR | Status: AC
  Filled 2012-11-08: qty 8

## 2012-11-08 MED ORDER — THROMBIN 5000 UNITS EX SOLR
CUTANEOUS | Status: DC | PRN
  Administered 2012-11-08 (×2): 5000 [IU] via TOPICAL

## 2012-11-08 MED ORDER — ACETAMINOPHEN 325 MG PO TABS: 650.0000 mg | ORAL_TABLET | ORAL | Status: DC | PRN

## 2012-11-08 MED ORDER — LIDOCAINE-EPINEPHRINE 1 %-1:100000 IJ SOLN
INTRAMUSCULAR | Status: DC | PRN
  Administered 2012-11-08: 16 mL

## 2012-11-08 MED ORDER — PHENYLEPHRINE HCL 10 MG/ML IJ SOLN
10.0000 mg | INTRAVENOUS | Status: DC | PRN
  Administered 2012-11-08: 50 ug/min via INTRAVENOUS

## 2012-11-08 MED ORDER — DEXAMETHASONE SODIUM PHOSPHATE 10 MG/ML IJ SOLN
10.0000 mg | INTRAMUSCULAR | Status: AC
  Administered 2012-11-08: 10 mg via INTRAVENOUS
  Filled 2012-11-08: qty 1

## 2012-11-08 MED ORDER — PAROXETINE HCL 20 MG PO TABS
20.0000 mg | ORAL_TABLET | Freq: Every day | ORAL | Status: DC
  Administered 2012-11-09 – 2012-11-11 (×3): 20 mg via ORAL
  Filled 2012-11-08 (×3): qty 1

## 2012-11-08 MED ORDER — ONDANSETRON HCL 4 MG/2ML IJ SOLN
INTRAMUSCULAR | Status: DC | PRN
  Administered 2012-11-08: 4 mg via INTRAVENOUS

## 2012-11-08 MED ORDER — SODIUM CHLORIDE 0.9 % IV SOLN
INTRAVENOUS | Status: DC | PRN
  Administered 2012-11-08 (×4): via INTRAVENOUS

## 2012-11-08 MED ORDER — ONDANSETRON HCL 4 MG/2ML IJ SOLN: 4.0000 mg | INTRAMUSCULAR | Status: DC | PRN

## 2012-11-08 MED ORDER — LACTATED RINGERS IV SOLN: INTRAVENOUS | Status: DC | PRN

## 2012-11-08 MED ORDER — FUROSEMIDE 10 MG/ML IJ SOLN
20.0000 mg | Freq: Once | INTRAMUSCULAR | Status: AC
  Administered 2012-11-08: 25 mg via INTRAVENOUS

## 2012-11-08 MED ORDER — NORTRIPTYLINE HCL 25 MG PO CAPS
25.0000 mg | ORAL_CAPSULE | Freq: Every day | ORAL | Status: DC
  Administered 2012-11-08 – 2012-11-10 (×3): 25 mg via ORAL
  Filled 2012-11-08 (×4): qty 1

## 2012-11-08 MED ORDER — ALBUMIN HUMAN 5 % IV SOLN
INTRAVENOUS | Status: DC | PRN
  Administered 2012-11-08 (×2): via INTRAVENOUS

## 2012-11-08 MED ORDER — SODIUM CHLORIDE 0.9 % IV SOLN
INTRAVENOUS | Status: AC
  Filled 2012-11-08: qty 500

## 2012-11-08 MED ORDER — GLYCOPYRROLATE 0.2 MG/ML IJ SOLN
INTRAMUSCULAR | Status: DC | PRN
  Administered 2012-11-08: .8 mg via INTRAVENOUS

## 2012-11-08 MED ORDER — SUCCINYLCHOLINE CHLORIDE 20 MG/ML IJ SOLN
INTRAMUSCULAR | Status: DC | PRN
  Administered 2012-11-08: 100 mg via INTRAVENOUS

## 2012-11-08 MED ORDER — KCL IN DEXTROSE-NACL 20-5-0.45 MEQ/L-%-% IV SOLN
INTRAVENOUS | Status: DC
  Administered 2012-11-08 – 2012-11-09 (×2): via INTRAVENOUS
  Filled 2012-11-08 (×3): qty 1000

## 2012-11-08 MED ORDER — ARTIFICIAL TEARS OP OINT
TOPICAL_OINTMENT | OPHTHALMIC | Status: DC | PRN
  Administered 2012-11-08: 1 via OPHTHALMIC

## 2012-11-08 MED ORDER — ONDANSETRON HCL 4 MG PO TABS: 4.0000 mg | ORAL_TABLET | ORAL | Status: DC | PRN

## 2012-11-08 MED ORDER — ROCURONIUM BROMIDE 100 MG/10ML IV SOLN
INTRAVENOUS | Status: DC | PRN
  Administered 2012-11-08: 50 mg via INTRAVENOUS

## 2012-11-08 MED ORDER — VECURONIUM BROMIDE 10 MG IV SOLR
INTRAVENOUS | Status: DC | PRN
  Administered 2012-11-08: .5 mg via INTRAVENOUS
  Administered 2012-11-08: 2 mg via INTRAVENOUS
  Administered 2012-11-08: 1 mg via INTRAVENOUS

## 2012-11-08 MED ORDER — HYDROMORPHONE HCL PF 1 MG/ML IJ SOLN
INTRAMUSCULAR | Status: AC
  Filled 2012-11-08: qty 1

## 2012-11-08 MED ORDER — HYDROMORPHONE HCL PF 1 MG/ML IJ SOLN
0.2500 mg | INTRAMUSCULAR | Status: DC | PRN
  Administered 2012-11-08 (×2): 0.5 mg via INTRAVENOUS

## 2012-11-08 MED ORDER — MORPHINE SULFATE 2 MG/ML IJ SOLN
1.0000 mg | INTRAMUSCULAR | Status: DC | PRN
  Administered 2012-11-08: 2 mg via INTRAVENOUS
  Administered 2012-11-08: 1 mg via INTRAVENOUS
  Administered 2012-11-09: 2 mg via INTRAVENOUS
  Administered 2012-11-10: 1 mg via INTRAVENOUS
  Filled 2012-11-08 (×4): qty 1

## 2012-11-08 MED ORDER — ACETAMINOPHEN 650 MG RE SUPP: 650.0000 mg | RECTAL | Status: DC | PRN

## 2012-11-08 MED ORDER — BACITRACIN 50000 UNITS IM SOLR
INTRAMUSCULAR | Status: DC | PRN
  Administered 2012-11-08: 09:00:00

## 2012-11-08 MED ORDER — FUROSEMIDE 10 MG/ML IJ SOLN
5.0000 mg | Freq: Once | INTRAMUSCULAR | Status: AC
  Administered 2012-11-08: 5 mg via INTRAVENOUS

## 2012-11-08 MED ORDER — ALBUTEROL SULFATE (5 MG/ML) 0.5% IN NEBU
INHALATION_SOLUTION | RESPIRATORY_TRACT | Status: AC
  Filled 2012-11-08: qty 0.5

## 2012-11-08 MED ORDER — ALBUTEROL SULFATE (5 MG/ML) 0.5% IN NEBU
2.5000 mg | INHALATION_SOLUTION | Freq: Four times a day (QID) | RESPIRATORY_TRACT | Status: DC | PRN
  Administered 2012-11-08: 2.5 mg via RESPIRATORY_TRACT

## 2012-11-08 MED ORDER — BACITRACIN 50000 UNITS IM SOLR
INTRAMUSCULAR | Status: AC
  Filled 2012-11-08: qty 1

## 2012-11-08 MED ORDER — LIDOCAINE HCL 4 % MT SOLN
OROMUCOSAL | Status: DC | PRN
  Administered 2012-11-08: 4 mL via TOPICAL

## 2012-11-08 MED ORDER — CHLORHEXIDINE GLUCONATE 0.12 % MT SOLN
15.0000 mL | Freq: Two times a day (BID) | OROMUCOSAL | Status: DC
  Administered 2012-11-08 – 2012-11-11 (×6): 15 mL via OROMUCOSAL
  Filled 2012-11-08 (×8): qty 15

## 2012-11-08 MED ORDER — HYDROCHLOROTHIAZIDE 25 MG PO TABS
25.0000 mg | ORAL_TABLET | Freq: Every day | ORAL | Status: DC
  Administered 2012-11-09 – 2012-11-11 (×3): 25 mg via ORAL
  Filled 2012-11-08 (×3): qty 1

## 2012-11-08 MED ORDER — LABETALOL HCL 5 MG/ML IV SOLN: 10.0000 mg | INTRAVENOUS | Status: DC | PRN

## 2012-11-08 MED ORDER — DEXAMETHASONE SODIUM PHOSPHATE 10 MG/ML IJ SOLN
6.0000 mg | Freq: Four times a day (QID) | INTRAMUSCULAR | Status: AC
  Administered 2012-11-08 – 2012-11-09 (×4): 6 mg via INTRAVENOUS
  Filled 2012-11-08 (×2): qty 0.6
  Filled 2012-11-08 (×2): qty 1

## 2012-11-08 MED ORDER — HYDROCODONE-ACETAMINOPHEN 5-325 MG PO TABS
1.0000 | ORAL_TABLET | ORAL | Status: DC | PRN
  Administered 2012-11-08 – 2012-11-11 (×8): 1 via ORAL
  Filled 2012-11-08 (×8): qty 1

## 2012-11-08 MED ORDER — PROPOFOL 10 MG/ML IV BOLUS
INTRAVENOUS | Status: DC | PRN
  Administered 2012-11-08: 100 mg via INTRAVENOUS
  Administered 2012-11-08: 200 mg via INTRAVENOUS

## 2012-11-08 MED ORDER — OXYMETAZOLINE HCL 0.05 % NA SOLN
NASAL | Status: DC | PRN
  Administered 2012-11-08: 1 via NASAL

## 2012-11-08 MED ORDER — 0.9 % SODIUM CHLORIDE (POUR BTL) OPTIME
TOPICAL | Status: DC | PRN
  Administered 2012-11-08: 1000 mL

## 2012-11-08 MED ORDER — DEXAMETHASONE SODIUM PHOSPHATE 4 MG/ML IJ SOLN
4.0000 mg | Freq: Four times a day (QID) | INTRAMUSCULAR | Status: AC
  Administered 2012-11-09 – 2012-11-10 (×4): 4 mg via INTRAVENOUS
  Filled 2012-11-08 (×6): qty 1

## 2012-11-08 SURGICAL SUPPLY — 93 items
ATTRACTOMAT 16X20 MAGNETIC DRP (DRAPES) ×2 IMPLANT
BAG DECANTER FOR FLEXI CONT (MISCELLANEOUS) ×2 IMPLANT
BENZOIN TINCTURE PRP APPL 2/3 (GAUZE/BANDAGES/DRESSINGS) ×2 IMPLANT
BLADE SURG 10 STRL SS (BLADE) ×2 IMPLANT
BLADE SURG 11 STRL SS (BLADE) ×4 IMPLANT
BLADE SURG 15 STRL LF DISP TIS (BLADE) ×2 IMPLANT
BLADE SURG 15 STRL SS (BLADE) ×2
BRUSH SCRUB EZ PLAIN DRY (MISCELLANEOUS) ×2 IMPLANT
CANISTER SUCTION 2500CC (MISCELLANEOUS) ×2 IMPLANT
CLOTH BEACON ORANGE TIMEOUT ST (SAFETY) ×2 IMPLANT
CONT SPEC 4OZ CLIKSEAL STRL BL (MISCELLANEOUS) ×4 IMPLANT
CORDS BIPOLAR (ELECTRODE) ×2 IMPLANT
COVER MAYO STAND STRL (DRAPES) ×2 IMPLANT
COVER TABLE BACK 60X90 (DRAPES) IMPLANT
DERMABOND ADHESIVE PROPEN (GAUZE/BANDAGES/DRESSINGS) ×1
DERMABOND ADVANCED (GAUZE/BANDAGES/DRESSINGS) ×1
DERMABOND ADVANCED .7 DNX12 (GAUZE/BANDAGES/DRESSINGS) ×1 IMPLANT
DERMABOND ADVANCED .7 DNX6 (GAUZE/BANDAGES/DRESSINGS) ×1 IMPLANT
DRAPE EENT ADH APERT 15X15 STR (DRAPES) IMPLANT
DRAPE INCISE IOBAN 66X45 STRL (DRAPES) ×2 IMPLANT
DRAPE MICROSCOPE ZEISS OPMI (DRAPES) ×2 IMPLANT
DRAPE POUCH INSTRU U-SHP 10X18 (DRAPES) ×2 IMPLANT
DRAPE PROXIMA HALF (DRAPES) ×4 IMPLANT
DRAPE SURG 17X23 STRL (DRAPES) ×6 IMPLANT
DRESSING NASAL POPE 10X1.5X2.5 (GAUZE/BANDAGES/DRESSINGS) ×1 IMPLANT
DRESSING TELFA 8X3 (GAUZE/BANDAGES/DRESSINGS) ×2 IMPLANT
DRSG NASAL POPE 10X1.5X2.5 (GAUZE/BANDAGES/DRESSINGS) ×2
ELECT COATED BLADE 2.86 ST (ELECTRODE) ×2 IMPLANT
ELECT NEEDLE TIP 2.8 STRL (NEEDLE) ×2 IMPLANT
ELECT REM PT RETURN 9FT ADLT (ELECTROSURGICAL) ×2
ELECTRODE REM PT RTRN 9FT ADLT (ELECTROSURGICAL) ×1 IMPLANT
GAUZE PACKING FOLDED 2  STR (GAUZE/BANDAGES/DRESSINGS) ×1
GAUZE PACKING FOLDED 2 STR (GAUZE/BANDAGES/DRESSINGS) ×1 IMPLANT
GAUZE SPONGE 2X2 8PLY STRL LF (GAUZE/BANDAGES/DRESSINGS) ×1 IMPLANT
GLOVE BIO SURGEON STRL SZ7.5 (GLOVE) ×4 IMPLANT
GLOVE BIOGEL M 8.0 STRL (GLOVE) ×2 IMPLANT
GLOVE ECLIPSE 7.5 STRL STRAW (GLOVE) ×4 IMPLANT
GLOVE EXAM NITRILE LRG STRL (GLOVE) ×4 IMPLANT
GLOVE EXAM NITRILE XL STR (GLOVE) IMPLANT
GLOVE EXAM NITRILE XS STR PU (GLOVE) IMPLANT
GLOVE INDICATOR 7.5 STRL GRN (GLOVE) ×2 IMPLANT
GLOVE INDICATOR 8.5 STRL (GLOVE) ×2 IMPLANT
GLOVE SURG SS PI 7.0 STRL IVOR (GLOVE) ×6 IMPLANT
GLOVE SURG SS PI 8.5 STRL IVOR (GLOVE) ×2
GLOVE SURG SS PI 8.5 STRL STRW (GLOVE) ×2 IMPLANT
GOWN BRE IMP SLV AUR LG STRL (GOWN DISPOSABLE) IMPLANT
GOWN BRE IMP SLV AUR XL STRL (GOWN DISPOSABLE) ×10 IMPLANT
GOWN STRL REIN 2XL LVL4 (GOWN DISPOSABLE) ×6 IMPLANT
HANDSWITCHING SUCTION COAGULATOR ×2 IMPLANT
HEMOSTAT SURGICEL 2X14 (HEMOSTASIS) IMPLANT
KIT BASIN OR (CUSTOM PROCEDURE TRAY) ×2 IMPLANT
KIT DRAIN CSF ACCUDRAIN (MISCELLANEOUS) IMPLANT
KIT ROOM TURNOVER OR (KITS) ×2 IMPLANT
NEEDLE HYPO 25X1 1.5 SAFETY (NEEDLE) ×2 IMPLANT
NEEDLE SPNL 22GX3.5 QUINCKE BK (NEEDLE) ×2 IMPLANT
NS IRRIG 1000ML POUR BTL (IV SOLUTION) ×2 IMPLANT
PAD ARMBOARD 7.5X6 YLW CONV (MISCELLANEOUS) ×2 IMPLANT
PATTIES SURGICAL .25X.25 (GAUZE/BANDAGES/DRESSINGS) IMPLANT
PATTIES SURGICAL .5 X.5 (GAUZE/BANDAGES/DRESSINGS) ×2 IMPLANT
PATTIES SURGICAL .5 X3 (DISPOSABLE) ×2 IMPLANT
PENCIL BUTTON HOLSTER BLD 10FT (ELECTRODE) ×2 IMPLANT
RUBBERBAND STERILE (MISCELLANEOUS) ×4 IMPLANT
SHEET SIL 040 (INSTRUMENTS) IMPLANT
SPLINT NASAL DOYLE BI-VL (GAUZE/BANDAGES/DRESSINGS) ×2 IMPLANT
SPONGE GAUZE 2X2 STER 10/PKG (GAUZE/BANDAGES/DRESSINGS) ×1
SPONGE GAUZE 4X4 12PLY (GAUZE/BANDAGES/DRESSINGS) ×2 IMPLANT
SPONGE LAP 4X18 X RAY DECT (DISPOSABLE) ×2 IMPLANT
SPONGE SURGIFOAM ABS GEL 12-7 (HEMOSTASIS) IMPLANT
STAPLER SKIN PROX WIDE 3.9 (STAPLE) ×2 IMPLANT
STRIP CLOSURE SKIN 1/2X4 (GAUZE/BANDAGES/DRESSINGS) ×2 IMPLANT
SUT BONE WAX W31G (SUTURE) ×2 IMPLANT
SUT CHROMIC 3 0 PS 2 (SUTURE) ×4 IMPLANT
SUT CHROMIC 4 0 P 3 18 (SUTURE) IMPLANT
SUT CHROMIC 4 0 PS 2 18 (SUTURE) ×2 IMPLANT
SUT CHROMIC 5 0 P 3 (SUTURE) ×2 IMPLANT
SUT ETHILON 3 0 PS 1 (SUTURE) IMPLANT
SUT ETHILON 4 0 PS 2 18 (SUTURE) ×4 IMPLANT
SUT ETHILON 5 0 P 3 18 (SUTURE)
SUT ETHILON 6 0 P 1 (SUTURE) ×2 IMPLANT
SUT NYLON ETHILON 5-0 P-3 1X18 (SUTURE) IMPLANT
SUT PLAIN 4 0 ~~LOC~~ 1 (SUTURE) IMPLANT
SUT SILK 2 0 FS (SUTURE) IMPLANT
SUT VIC AB 2-0 CP2 18 (SUTURE) ×2 IMPLANT
SUT VIC AB 4-0 P-3 18X BRD (SUTURE) ×1 IMPLANT
SUT VIC AB 4-0 P3 18 (SUTURE) ×1
SYR 20ML ECCENTRIC (SYRINGE) ×2 IMPLANT
SYR CONTROL 10ML LL (SYRINGE) ×2 IMPLANT
TAPE CLOTH SURG 4X10 WHT LF (GAUZE/BANDAGES/DRESSINGS) ×2 IMPLANT
TOWEL OR 17X24 6PK STRL BLUE (TOWEL DISPOSABLE) ×2 IMPLANT
TOWEL OR 17X26 10 PK STRL BLUE (TOWEL DISPOSABLE) ×2 IMPLANT
TRAY ENT MC OR (CUSTOM PROCEDURE TRAY) ×2 IMPLANT
TRAY FOLEY CATH 14FRSI W/METER (CATHETERS) ×2 IMPLANT
WATER STERILE IRR 1000ML POUR (IV SOLUTION) ×2 IMPLANT

## 2012-11-08 NOTE — Transfer of Care (Signed)
Immediate Anesthesia Transfer of Care Note  Patient: Wayne Booth  Procedure(s) Performed: Procedure(s) with comments: CRANIOTOMY HYPOPHYSECTOMY TRANSNASAL APPROACH (N/A) - Transphenoidal Resection of Pituitary Tumor TRANSPHENOIDAL APPROACH EXPOSURE (N/A) - Transphenoidal Resection of Pituitary Tumor  Patient Location: PACU  Anesthesia Type:General  Level of Consciousness: awake, alert  and oriented  Airway & Oxygen Therapy: Patient Spontanous Breathing and Patient connected to face mask oxygen  Post-op Assessment: Report given to PACU RN, Post -op Vital signs reviewed and stable and Patient moving all extremities X 4  Post vital signs: Reviewed and stable  Complications: No apparent anesthesia complications

## 2012-11-08 NOTE — Progress Notes (Signed)
Post Op check Awake alert and breathing more comfortably. Sats mid 90s Packing intact without bleeding. Stable post op.

## 2012-11-08 NOTE — Anesthesia Preprocedure Evaluation (Addendum)
Anesthesia Evaluation  Patient identified by MRN, date of birth, ID band Patient awake    Reviewed: Allergy & Precautions, H&P , NPO status , Patient's Chart, lab work & pertinent test results  Airway Mallampati: II TM Distance: >3 FB Neck ROM: Full   Comment: Large neck Dental  (+) Edentulous Upper and Dental Advisory Given   Pulmonary sleep apnea , pneumonia -,  breath sounds clear to auscultation        Cardiovascular hypertension, Rhythm:Regular Rate:Normal     Neuro/Psych Anxiety    GI/Hepatic negative GI ROS, Neg liver ROS,   Endo/Other    Renal/GU Renal disease     Musculoskeletal   Abdominal   Peds  Hematology   Anesthesia Other Findings   Reproductive/Obstetrics                          Anesthesia Physical Anesthesia Plan  ASA: III  Anesthesia Plan: General   Post-op Pain Management:    Induction: Intravenous  Airway Management Planned: Oral ETT  Additional Equipment:   Intra-op Plan:   Post-operative Plan: Possible Post-op intubation/ventilation  Informed Consent:   Dental advisory given  Plan Discussed with: CRNA and Anesthesiologist  Anesthesia Plan Comments:        Anesthesia Quick Evaluation

## 2012-11-08 NOTE — Progress Notes (Signed)
UR COMPLETED  

## 2012-11-08 NOTE — Brief Op Note (Signed)
11/08/2012  10:36 AM  PATIENT:  Wayne Booth  72 y.o. male  PRE-OPERATIVE DIAGNOSIS:  pituitary tumor  POST-OPERATIVE DIAGNOSIS:  pituitary tumor  PROCEDURE:  Procedure(s) with comments: CRANIOTOMY HYPOPHYSECTOMY TRANSNASAL APPROACH (N/A) - Transphenoidal Resection of Pituitary Tumor TRANSPHENOIDAL APPROACH EXPOSURE (N/A) - Transphenoidal Resection of Pituitary Tumor  SURGEON:  Surgeon(s) and Role: Panel 1:    * [name], MD - Primary    * Karn Cassis, MD - Assisting  Panel 2:    * Drema Halon, MD - Primary  PHYSICIAN ASSISTANT:   ASSISTANTS: none   ANESTHESIA:   general  EBL:  Total I/O In: 2500 [I.V.:2000; IV Piggyback:500] Out: 450 [Urine:450]  BLOOD ADMINISTERED:none  DRAINS: none   LOCAL MEDICATIONS USED:  LIDOCAINE with EPI   12cc  SPECIMEN:  Source of Specimen:  pituitary gland  DISPOSITION OF SPECIMEN:  PATHOLOGY  COUNTS:  YES  TOURNIQUET:  * No tourniquets in log *  DICTATION: .Other Dictation: Dictation Number [id number]  PLAN OF CARE: Admit to inpatient   PATIENT DISPOSITION:  PACU - hemodynamically stable.   Delay start of Pharmacological VTE agent (>24hrs) due to surgical blood loss or risk of bleeding: yes

## 2012-11-08 NOTE — Anesthesia Postprocedure Evaluation (Signed)
  Anesthesia Post-op Note  Patient: Wayne Booth  Procedure(s) Performed: Procedure(s) with comments: CRANIOTOMY HYPOPHYSECTOMY TRANSNASAL APPROACH (N/A) - Transphenoidal Resection of Pituitary Tumor TRANSPHENOIDAL APPROACH EXPOSURE (N/A) - Transphenoidal Resection of Pituitary Tumor  Patient Location: PACU and NICU  Anesthesia Type:General  Level of Consciousness: awake  Airway and Oxygen Therapy: Patient Spontanous Breathing  Post-op Pain: mild  Post-op Assessment: Post-op Vital signs reviewed  Post-op Vital Signs: Reviewed  Complications: No apparent anesthesia complications

## 2012-11-08 NOTE — H&P (Signed)
Wayne Booth is an 72 y.o. male.   Chief Complaint: Pituitary mass HPI: The patient is a 72 year old gentleman who presented to Dr. Marikay Alar with neck and upper extremity difficulty. He underwent an anterior cervical discectomy which he tolerated without difficulty. On his preoperative MRI scan he was noted to have a large pituitary mass consistent with a macroadenoma. Once he had recovered from his neck surgery he was referred to me for further treatment. 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 After splitting the situation to him and discussing all the options the patient requested surgery now comes for a transsphenoidal approach for section was pituitary macroadenoma. I've had a long discussion with him regarding the risks and benefits of surgical intervention. The risks discussed include but are not limited to bleeding infection weakness numbness paralysis coma told pituitary function vision issues with the nasal passage coma and death. We've discussed the possible need for pituitary hormone replacement long-term. He's had the opportunity to ask numerous questions and appears to understand. With this information in hand he has requested we proceed with surgery.  Past Medical History  Diagnosis Date  . History of total knee replacement   . Hypertension   . Kidney stone   . Cancer   . Prostate cancer   . Kidney cancer, primary, with metastasis from kidney to other site   . Anxiety   . Pneumonia     hx of  . OSA (obstructive sleep apnea)     cpap at night  . Arthritis   . Pituitary cyst     Past Surgical History  Procedure Laterality Date  . Appendectomy    . Cholecystectomy    . Nephrectomy Left   . Insertion prostate radiation seed    . Replacement total knee bilateral    . Knee arthroscopy  2007    left  . Anterior cervical decomp/discectomy fusion N/A 08/29/2012    Procedure: ANTERIOR CERVICAL DECOMPRESSION/DISCECTOMY FUSION  1 LEVEL;  Surgeon: Tia Alert, MD;  Location: [facility];  Service: Neurosurgery;  Laterality: N/A;    Family History  Problem Relation Age of Onset  . Cancer Father     ?mouth  . Cancer Mother     stomach  . Kidney cancer Sister   . Colon cancer Brother    Social History:  reports that he has been smoking Cigars.  He has never used smokeless tobacco. He reports that he drinks about 1.2 ounces of alcohol per week. He reports that he does not use illicit drugs.  Allergies: No Known Allergies  Medications Prior to Admission  Medication Sig Dispense Refill  . Alum & Mag Hydroxide-Simeth (MAGIC MOUTHWASH) SOLN Take 5 mLs by mouth 4 (four) times daily as needed.  120 mL  0  . aspirin EC 81 MG tablet Take 81 mg by mouth daily.      . cyclobenzaprine (FLEXERIL) 10 MG tablet Take 10 mg by mouth daily as needed for muscle spasms.      Marland Kitchen docusate sodium (COLACE) 100 MG capsule Take 100 mg by mouth daily.      . fluticasone (FLONASE) 50 MCG/ACT nasal spray Place 2 sprays into the nose daily.  16 g  6  . hydrochlorothiazide (HYDRODIURIL) 25 MG tablet Take 25 mg by mouth daily.      Marland Kitchen losartan (COZAAR) 100 MG tablet Take 100 mg by mouth daily.      . Multiple Vitamins-Minerals (MULTIVITAMINS THER. W/MINERALS) TABS  Take 1 tablet by mouth daily.      . nortriptyline (PAMELOR) 25 MG capsule Take 1 capsule (25 mg total) by mouth at bedtime.  90 capsule  3  . Omega-3 Fatty Acids (FISH OIL) 1200 MG CAPS Take 1 capsule by mouth daily.      Marland Kitchen oxybutynin (DITROPAN-XL) 10 MG 24 hr tablet Take 10 mg by mouth daily.      Marland Kitchen oxyCODONE-acetaminophen (PERCOCET/ROXICET) 5-325 MG per tablet Take 1 tablet by mouth daily as needed. pain      . PARoxetine (PAXIL) 20 MG tablet Take 20 mg by mouth daily.      . Triamcinolone Acetonide (TRIAMCINOLONE 0.1 % CREAM : EUCERIN) CREA Apply 1 application topically 2 (two) times daily.  1 each  0    No results found for this or any previous visit (from the past 48  hour(s)). No results found.  Review of systems not obtained due to patient factors.  Blood pressure 133/79, temperature 96.4 F (35.8 C), resp. rate 20, SpO2 94.00%.  The patient is awake alert and oriented. His no facial asymmetry. His gait is nonantalgic. Strength is 5 over 5 L5 nerve function is normal. Assessment/Plan Impression is that of a pituitary macroadenoma. The plan is for a transsphenoidal approach for resection of this pituitary tumor.  [name], MD 11/08/2012, 7:31 AM

## 2012-11-08 NOTE — Anesthesia Procedure Notes (Signed)
Procedure Name: Intubation Date/Time: 11/08/2012 7:49 AM Performed by: Lanell Matar Pre-anesthesia Checklist: Patient identified, Emergency Drugs available, Suction available, Patient being monitored and Timeout performed Patient Re-evaluated:Patient Re-evaluated prior to inductionOxygen Delivery Method: Circle system utilized Preoxygenation: Pre-oxygenation with 100% oxygen Intubation Type: IV induction Laryngoscope Size: Miller and 2 Grade View: Grade I Tube type: Oral Tube size: 7.5 mm Number of attempts: 1 Airway Equipment and Method: Stylet and LTA kit utilized Placement Confirmation: ETT inserted through vocal cords under direct vision,  CO2 detector,  positive ETCO2 and breath sounds checked- equal and bilateral Secured at: 25 (at lip) cm Tube secured with: Tape Dental Injury: Teeth and Oropharynx as per pre-operative assessment

## 2012-11-08 NOTE — Consult Note (Signed)
PULMONARY  / CRITICAL CARE MEDICINE  Name: Wayne Booth MRN: [id number] DOB: 04-03-1941    ADMISSION DATE:  11/08/2012 CONSULTATION DATE: 11/08/2012  REFERRING MD :  Gerlene Fee PRIMARY SERVICE: NSGY  CHIEF COMPLAINT:  Respiratory Distress  BRIEF PATIENT DESCRIPTION: 72 y/o WM with hx of HTN and OSA  s/p transphenoidal resection of pituitary tumor 5/13 with respiratory distress and hypotension in PACU. PCCM consult requested  SIGNIFICANT EVENTS / STUDIES:  5/13-Transphenoidal Resection of Pituitary Tumor  LINES / TUBES: 5/13-A-line>>>  CULTURES: None at this time  ANTIBIOTICS: 5/13 Ancef (surgical prophylaxis)>>>  HISTORY OF PRESENT ILLNESS:  72 y/o WM with hx of HTN and OSA  s/p transphenoidal resection of pituitary tumor 5/13 with respiratory distress and hypotension in PACU. Pt had O2 sats in the low 80s s/p extubation. Pt reports taking his HCTZ this morning. Patient was given 3 bolus of neo, which stabilized his BP.   PAST MEDICAL HISTORY :  Past Medical History  Diagnosis Date  . History of total knee replacement   . Hypertension   . Kidney stone   . Cancer   . Prostate cancer   . Kidney cancer, primary, with metastasis from kidney to other site   . Anxiety   . Pneumonia     hx of  . OSA (obstructive sleep apnea)     cpap at night  . Arthritis   . Pituitary cyst    Past Surgical History  Procedure Laterality Date  . Appendectomy    . Cholecystectomy    . Nephrectomy Left   . Insertion prostate radiation seed    . Replacement total knee bilateral    . Knee arthroscopy  2007    left  . Anterior cervical decomp/discectomy fusion N/A 08/29/2012    Procedure: ANTERIOR CERVICAL DECOMPRESSION/DISCECTOMY FUSION 1 LEVEL;  Surgeon: Tia Alert, MD;  Location: [facility];  Service: Neurosurgery;  Laterality: N/A;   Prior to Admission medications   Medication Sig Start Date End Date Taking? Authorizing Provider  Alum & Mag Hydroxide-Simeth (MAGIC MOUTHWASH) SOLN  Take 5 mLs by mouth 4 (four) times daily as needed. 10/31/12  Yes Baker Pierini, FNP  aspirin EC 81 MG tablet Take 81 mg by mouth daily.   Yes Historical Provider, MD  cyclobenzaprine (FLEXERIL) 10 MG tablet Take 10 mg by mouth daily as needed for muscle spasms.   Yes Historical Provider, MD  docusate sodium (COLACE) 100 MG capsule Take 100 mg by mouth daily.   Yes Historical Provider, MD  fluticasone (FLONASE) 50 MCG/ACT nasal spray Place 2 sprays into the nose daily. 10/31/12  Yes Baker Pierini, FNP  hydrochlorothiazide (HYDRODIURIL) 25 MG tablet Take 25 mg by mouth daily.   Yes Historical Provider, MD  losartan (COZAAR) 100 MG tablet Take 100 mg by mouth daily.   Yes Historical Provider, MD  Multiple Vitamins-Minerals (MULTIVITAMINS THER. W/MINERALS) TABS Take 1 tablet by mouth daily.   Yes Historical Provider, MD  nortriptyline (PAMELOR) 25 MG capsule Take 1 capsule (25 mg total) by mouth at bedtime. 11/09/11  Yes Bruce Romilda Garret, MD  Omega-3 Fatty Acids (FISH OIL) 1200 MG CAPS Take 1 capsule by mouth daily.   Yes Historical Provider, MD  oxybutynin (DITROPAN-XL) 10 MG 24 hr tablet Take 10 mg by mouth daily.   Yes Historical Provider, MD  oxyCODONE-acetaminophen (PERCOCET/ROXICET) 5-325 MG per tablet Take 1 tablet by mouth daily as needed. pain 09/12/12  Yes Historical Provider, MD  PARoxetine (PAXIL) 20 MG  tablet Take 20 mg by mouth daily.   Yes Historical Provider, MD  Triamcinolone Acetonide (TRIAMCINOLONE 0.1 % CREAM : EUCERIN) CREA Apply 1 application topically 2 (two) times daily. 10/31/12  Yes Baker Pierini, FNP   No Known Allergies  FAMILY HISTORY:  Family History  Problem Relation Age of Onset  . Cancer Father     ?mouth  . Cancer Mother     stomach  . Kidney cancer Sister   . Colon cancer Brother    SOCIAL HISTORY:  reports that he has been smoking Cigars.  He has never used smokeless tobacco. He reports that he drinks about 1.2 ounces of alcohol per week. He reports  that he does not use illicit drugs.  REVIEW OF SYSTEMS:  Gen: Denies fever, chills, weight change, fatigue, night sweats HEENT: Denies blurred vision, double vision, hearing loss, tinnitus, sinus congestion, rhinorrhea, sore throat, neck stiffness, dysphagia PULM: Denies shortness of breath, cough, sputum production, hemoptysis, wheezing CV: Denies chest pain, edema, orthopnea, paroxysmal nocturnal dyspnea, palpitations GI: Denies abdominal pain, nausea, vomiting, diarrhea, hematochezia, melena, constipation, change in bowel habits GU: Denies dysuria, hematuria, polyuria, oliguria, urethral discharge Endocrine: Denies hot or cold intolerance, polyuria, polyphagia or appetite change Derm: Denies rash, dry skin, scaling or peeling skin change Heme: Denies easy bruising, bleeding, bleeding gums Neuro: Denies headache, numbness, weakness, slurred speech, loss of memory or consciousness   SUBJECTIVE: Pt states that he wants the Bipap mask off. He denies dyspnea, dizziness, and chest pain.   VITAL SIGNS: Temp:  [96.4 F (35.8 C)-100.2 F (37.9 C)] 98 F (36.7 C) (05/13 1300) Pulse Rate:  [96-104] 97 (05/13 1315) Resp:  [16-27] 16 (05/13 1315) BP: (119-144)/(67-79) 130/76 mmHg (05/13 1315) SpO2:  [83 %-97 %] 97 % (05/13 1315) Arterial Line BP: (124-163)/(67-87) 124/67 mmHg (05/13 1315) FiO2 (%):  [100 %] 100 % (05/13 1200) Weight:  [298 lb 11.6 oz (135.5 kg)] 298 lb 11.6 oz (135.5 kg) (05/13 1300) HEMODYNAMICS:   VENTILATOR SETTINGS: Vent Mode:  [-] BIPAP FiO2 (%):  [100 %] 100 % Set Rate:  [14 bmp] 14 bmp PEEP:  [8 cmH20] 8 cmH20 Pressure Support:  [15 cmH20] 15 cmH20 INTAKE / OUTPUT: Intake/Output     05/12 0701 - 05/13 0700 05/13 0701 - 05/14 0700   I.V. (mL/kg)  3100 (22.9)   IV Piggyback  500   Total Intake(mL/kg)  3600 (26.6)   Urine (mL/kg/hr)  1800 (2.1)   Total Output   1800   Net   +1800          PHYSICAL EXAMINATION: General:  Obese WM in NAD Neuro:  Awake and  alert. Moves all extremities.  HEENT:  PERRL.  Cardiovascular:  RRR. No m/g/r Lungs:  CTAB. No rales, rhonchi, or wheezing Abdomen:  nontender Musculoskeletal:  No obvious deformities Skin:  Warm and dry  LABS: No results found for this basename: HGB, WBC, PLT, NA, K, CL, CO2, GLUCOSE, BUN, CREATININE, CALCIUM, MG, PHOS, AST, ALT, ALKPHOS, BILITOT, PROT, ALBUMIN, APTT, INR, LATICACIDVEN, TROPONINI, PROCALCITON, PROBNP, O2SATVEN, PHART, PCO2ART, PO2ART,  in the last 168 hours No results found for this basename: GLUCAP,  in the last 168 hours  CXR: low volume, no infiltrates. Prob some basilar atx.  ASSESSMENT / PLAN:  PULMONARY A:Acute Respiratory Failure likely narcotic induced hypoventilation in the setting of sleep apnea Pt is now fully awake protecting his airway and O2 sats are adequate P:   -Monitor O2 sats -attempt to take off Bipap, will  use PRN during day and mandatory at HS, eventually can transition back to CPAP (home setting 15 cmH2O) -add IS -careful with sedation  CARDIOVASCULAR A: transient hypotension likely due to taking home BP meds this morning and residual narcotics  P:  -monitor BP -looks ok to resume home BP meds 5/14  RENAL A:  No acute problems P:   -BMP in am  GASTROINTESTINAL A:  No acute problems P:   -advanced diet as tolerated  HEMATOLOGIC A:  No acute problems P:  -CBC in am  INFECTIOUS A:  No acute problems P:   -Monitor fever curve  ENDOCRINE A:  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 PCCM asked to consult for hypoxia and hypotension post op. Appears to be residual narcotic affect that is now resolved. Will monitor in the ICU.   I have personally obtained a history, examined the patient, evaluated laboratory and imaging results,  formulated the assessment and plan and placed orders.  Alyson Reedy, M.D. Pulmonary and Critical Care Medicine [place] Pager: [phone]  11/08/2012, 1:23 PM

## 2012-11-08 NOTE — Op Note (Signed)
Preop diagnosis: Sellar mass with extension into the sphenoid sinus Postop diagnosis: Pituitary macroadenoma Procedure: Transsphenoidal approach for resection of pituitary macroadenoma and abdominal fat graft Surgeon: Loralie Malta Assistants: [employer]-surgeon: Newman  After being placed in a semisitting position in 3 point fixation with the head turned towards the right shoulder the patient's nasal region and right abdomen were prepped and draped in the usual sterile fashion. Dr. Abbie Sons did a transsphenoidal approach into access into the sphenoid sinus. The same time a linear incision was made in the right abdomen and 2 large pieces of fat were obtained. It was then irrigated and closed with interrupted Vicryl on the subcutaneous fat and staples were placed on the skin. When access into the sphenoid sinus had been obtained I assumed confirmed the case. It was obvious mass into the sphenoid sinus and the capsule of the mass was coagulated and then incised. CZ type material was encountered most consistent with pituitary adenoma. We were able to get an excellent cleanout in all directions until no tumor could be identified. The diaphragma sella had not been violated and there was no evidence of CSF leakage. Irrigation was carried out and Gelfoam placed within the tumor capsule. A large piece of fat was placed in the sphenoid sinus. Frozen section came back consistent with pituitary adenoma. Dr. Thalia Bloodgood then assumed control of the case and closed in standard fashion.

## 2012-11-09 LAB — POCT URINE SPECIFIC GRAVITY, REFRACTOMETER
Specific gravity, refractometer-urine: 1.0103 (ref 1.001–1.035)
Specific gravity, refractometer-urine: 1.011 (ref 1.001–1.035)
Specific gravity, refractometer-urine: 1.011 (ref 1.001–1.035)
Specific gravity, refractometer-urine: 1.013 (ref 1.001–1.035)
Specific gravity, refractometer-urine: 1.013 (ref 1.001–1.035)

## 2012-11-09 LAB — BASIC METABOLIC PANEL
BUN: 17 mg/dL (ref 6–23)
CO2: 25 mEq/L (ref 19–32)
Chloride: 102 mEq/L (ref 96–112)
Creatinine, Ser: 1.18 mg/dL (ref 0.50–1.35)

## 2012-11-09 MED ORDER — PANTOPRAZOLE SODIUM 40 MG PO TBEC
40.0000 mg | DELAYED_RELEASE_TABLET | Freq: Every day | ORAL | Status: DC
  Administered 2012-11-09 – 2012-11-10 (×2): 40 mg via ORAL
  Filled 2012-11-09 (×2): qty 1

## 2012-11-09 MED ORDER — DIAZEPAM 5 MG PO TABS
5.0000 mg | ORAL_TABLET | Freq: Four times a day (QID) | ORAL | Status: DC | PRN
  Administered 2012-11-09 – 2012-11-11 (×4): 5 mg via ORAL
  Filled 2012-11-09 (×3): qty 1

## 2012-11-09 MED ORDER — DIAZEPAM 5 MG PO TABS
ORAL_TABLET | ORAL | Status: AC
  Filled 2012-11-09: qty 1

## 2012-11-09 NOTE — Op Note (Signed)
NAME:  SUEO, CULLEN NO.:  [id number]  MEDICAL RECORD NO.:  [id number]  LOCATION:  3110                         FACILITY:  [facility]  PHYSICIAN:  Kristine Garbe. Ezzard Standing, M.D.DATE OF BIRTH:  [birth year]  DATE OF PROCEDURE:  11/08/2012 DATE OF DISCHARGE:                              OPERATIVE REPORT   PREOPERATIVE DIAGNOSIS:  Pituitary adenoma.  POSTOPERATIVE DIAGNOSIS:  Pituitary adenoma.  OPERATION:  Transseptal and transsphenoidal resection of pituitary adenoma.  SURGEON:  Kristine Garbe. Ezzard Standing, M.D.  Mammie LorenzoReinaldo Meeker, M.D.  ANESTHESIA:  General endotracheal.  COMPLICATIONS:  None.  BRIEF CLINICAL:  Add Dinapoli is a 72 year old gentleman, who was recently had diagnosed with a pituitary mass consistent with pituitary adenoma.  It extrudes inferiorly and anteriorly into the sphenoid sinus. He is taken to the operating room at this time for transsphenoidal resection of pituitary tumor.  DESCRIPTION OF PROCEDURE:    The patient was brought to the neuro OR and positioned by Dr. Gerlene Fee.  The nose and abdomen were then prepped with Betadine solution and draped out with sterile towels.  The nose was then further prepped with cotton pledgets, soaked in Afrin.  Septum and floor of the nose was injected with 12-14 mL of Xylocaine with epinephrine for hemostasis.  I began the intranasal approach via extended hemitransfixion incision along the cartilage edge of the septum on the right side extending down to the floor of the nose while I was doing this approach and Dr. Gerlene Fee harvested  fat from a small abdominal incision.  The mucoperichondrium and mucoperiosteal was elevated off the right side of the septum posteriorly and off the floor of the nose.  The dissection was carried down to the floor of the nose on the left side just along the floor of the nose and mucoperiosteum was elevated up off the floor of nose up to the maxillary crest.   Approximately 2 cm posterior, a vertical incision was made through the cartilaginous septum.  The mucoperichondrium and mucoperiosteal flap was elevated on either side of the septum posterior to this.  The anterior cartilaginous septum, 2 cm was preserved.  This was reflected off of the maxillary crest and pushed over to the left airway.  Dissection was then carried out more posteriorly to the bony face of the sphenoid sinus as mucoperiosteal flaps were elevated on either side of the septum posteriorly to the base of the sphenoid sinus.  The harvested cartilage and bone was preserved for later closure.  Next, the [facility] retractor was positioned.  The patient had a very small nose and at this point, positioning of the [facility] retractor was little bit difficult and little bit tight, so I elected to make a small incision on the skin  along the ala on the right nostril to enlarge the nostril and allow adequate positioning of the [facility] retractor.  The [facility] retractor was then positioned.  The anterior bony wall of the sphenoid sinus was opened up with 4 mm osteotome and Kerrison forceps to expose the sphenoid sinus.  The sphenoid septum deviated to the patient's right side.  This was taken down.  The mucosa was stripped off of the sphenoid  sinus, and at this point, the tumor was exposed as it protruded anteriorly and inferiorly into the sphenoid sinus.  Dr. Gerlene Fee then removed the tumor and I was called back in for closure.  Following removal of the tumor, Dr. Gerlene Fee packed the sphenoid sinus with previously harvested abdominal fat.  The septum was then brought back to midline.  The hemitransfixion incision was closed with interrupted 4-0 chromic suture and extending skin incision along the ala of the nose was closed with interrupted 6-0 nylon sutures.  The septum was then basted with a 4-0 chromic suture.  Splints were secured to either side of the septum with a 3-0 nylon suture and the nose  was then packed with Merocel packing soaked in bacitracin ointment, and hydrated with saline.  There was minimal bleeding.  This completed the procedure.  The patient was subsequently awoken from anesthesia and transferred to the neuro ICU postoperatively.  We will plan on removing the nasal packing in 2-3 days.          ______________________________ Kristine Garbe Ezzard Standing, M.D.     CEN/MEDQ  D:  11/08/2012  T:  11/09/2012  Job:  [id number]

## 2012-11-09 NOTE — Progress Notes (Signed)
Dr. Jordan Likes ordered to D/C foley. Called Dr. Jordan Likes to clarify if he wanted to continue hourly specific gravity. Order to D/C hourly specific gravity. Also, reported that the patient is complaining of feeling anxious. Valium 5mg  q6hrs PRN ordered.

## 2012-11-09 NOTE — Progress Notes (Signed)
Postop day 1. Minimal headache. No visual complaints. No other problems overnight.  Afebrile. Vital signs stable. Patient awake and aware. He is oriented and appropriate. Visual acuity and visual fields intact. Extraocular movements full. Face symmetric movement and sensation. Motor 5/5 bilaterally with no pronator drift.  Urine output good with no evidence of diabetes insipidus. Laboratory studies with normal sodium.  Status post transsphenoidal pituitary tumor resection. Mobilize today. Continue ICU observation.

## 2012-11-09 NOTE — Progress Notes (Signed)
Pt unable to wear BiPAP due to pain and swelling. Vitals are stable.

## 2012-11-09 NOTE — Progress Notes (Signed)
Did not tolerate BiPAP. Otherwise no new issues. Progressing post op as anticipated  Would resume BiPAP when pt is able. There will be little consequence (other than poor sleep quality) if he goes a few days without it. To put it in perspective, he probably suffered many months to yrs with untreated OSA prior to the initiation of therapy  PCCM will sign off. Please call PRN   Wayne Fischer, MD ; [facility] [phone].  After 5:30 PM or weekends, call [phone]

## 2012-11-09 NOTE — Progress Notes (Signed)
Patient ID: Wayne Booth, male   DOB: 1940/12/05, 72 y.o.   MRN: [id number] Subjective: Patient reports feeling well  Objective: Vital signs in last 24 hours: Temp:  [98 F (36.7 C)-100.2 F (37.9 C)] 98.1 F (36.7 C) (05/14 0700) Pulse Rate:  [94-110] 94 (05/14 0800) Resp:  [13-27] 17 (05/14 0800) BP: (107-147)/(57-80) 112/67 mmHg (05/14 0800) SpO2:  [83 %-97 %] 97 % (05/14 0800) Arterial Line BP: (76-166)/(67-87) 106/77 mmHg (05/14 0800) FiO2 (%):  [100 %] 100 % (05/13 1200) Weight:  [135.5 kg (298 lb 11.6 oz)] 135.5 kg (298 lb 11.6 oz) (05/13 1300)  Intake/Output from previous day: 05/13 0701 - 05/14 0700 In: 5287.5 [P.O.:350; I.V.:4337.5; IV Piggyback:600] Out: 4310 [Urine:4310] Intake/Output this shift: Total I/O In: 125 [I.V.:75; IV Piggyback:50] Out: 325 [Urine:325]  awake, alert; no new neuro issues  Lab Results:  Recent Labs  11/08/12 1330  WBC 21.6*  HGB 15.4  HCT 44.0  PLT 273   BMET  Recent Labs  11/08/12 1330 11/09/12 0500  NA 136 138  K 4.4 4.4  CL 99 102  CO2 25 25  GLUCOSE 176* 181*  BUN 14 17  CREATININE 1.09 1.18  CALCIUM 8.4 8.3*    Studies/Results: Dg Chest Port 1 View  11/08/2012   *RADIOLOGY REPORT*  Clinical Data: Decreased oxygen saturation after surgery.  PORTABLE CHEST - 1 VIEW  Comparison: PA and lateral chest 01/13/2012.  Findings: Mild subsegmental atelectasis is seen in the left lung base.  Lungs otherwise clear.  Heart size upper normal.  No pneumothorax or pleural effusion.  IMPRESSION: Mild subsegmental atelectasis left lung base.  No acute finding.   Original Report Authenticated By: Holley Dexter, M.D.    Assessment/Plan: Doing great pod 1. No signs of DI. Will keep in ICU til tomorrow.   LOS: 1 day  as above   [name], MD 11/09/2012, 8:56 AM

## 2012-11-10 MED ORDER — BISACODYL 5 MG PO TBEC
5.0000 mg | DELAYED_RELEASE_TABLET | Freq: Every day | ORAL | Status: DC | PRN
  Administered 2012-11-10: 5 mg via ORAL
  Filled 2012-11-10: qty 1

## 2012-11-10 MED ORDER — POLYETHYLENE GLYCOL 3350 17 G PO PACK
17.0000 g | PACK | Freq: Every day | ORAL | Status: DC
  Administered 2012-11-10 – 2012-11-11 (×2): 17 g via ORAL
  Filled 2012-11-10 (×3): qty 1

## 2012-11-10 NOTE — Progress Notes (Signed)
POD 2 AF VSS Awake alert without respiratory problems Nasal packing in place. No bleeding or drainage Will plan on removing nasal packing in AM.

## 2012-11-10 NOTE — Progress Notes (Signed)
Postop day 2. No headache. No visual complaints. Only complaint is that of constipation.  Awake and alert. Oriented and appropriate. Cranial nerve function intact, motor and sensory function intact. Nasal packs dry. No evidence of CSF leak.  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 Mobilize more. Transfer to floor. Possible discharge tomorrow.

## 2012-11-11 ENCOUNTER — Encounter (HOSPITAL_COMMUNITY): Payer: Self-pay | Admitting: Neurosurgery

## 2012-11-11 MED ORDER — METHYLPREDNISOLONE 4 MG PO KIT: PACK | ORAL | Status: DC

## 2012-11-11 MED ORDER — BISACODYL 10 MG RE SUPP: 10.0000 mg | Freq: Every day | RECTAL | Status: DC | PRN

## 2012-11-11 MED ORDER — POLYETHYLENE GLYCOL 3350 17 G PO PACK: 17.0000 g | PACK | Freq: Every day | ORAL | Status: DC

## 2012-11-11 MED ORDER — OXYCODONE-ACETAMINOPHEN 5-325 MG PO TABS: 1.0000 | ORAL_TABLET | Freq: Every day | ORAL | Status: DC | PRN

## 2012-11-11 MED ORDER — BISACODYL 5 MG PO TBEC: 5.0000 mg | DELAYED_RELEASE_TABLET | Freq: Every day | ORAL | Status: DC | PRN

## 2012-11-11 NOTE — Progress Notes (Signed)
POD 3 AF VSS Packing removed from nose . No significant bleeding or evidence of CSF leak. Stable post op course Will have patient follow up in my office on Monday.

## 2012-11-11 NOTE — Discharge Summary (Signed)
Physician Discharge Summary  Patient ID: Wayne Booth MRN: [id number] DOB/AGE: 1940-09-13 72 y.o.  Admit date: 11/08/2012 Discharge date: 11/11/2012  Admission Diagnoses:  Discharge Diagnoses:  Active Problems:   OSA on CPAP   Hypoxemia   Discharged Condition: good  Hospital Course: Patient admitted to the hospital where he underwent an uncomplicated transsphenoidal resection of pituitary tumor. Postoperatively he is done very well. Had no visual complaints. He said no signs of endocrinopathy. He is mobilized without difficulty. He has no evidence of CSF leak. He is ready for discharge home.  Consults:   Significant Diagnostic Studies:   Treatments:   Discharge Exam: Blood pressure 149/78, pulse 89, temperature 97.5 F (36.4 C), temperature source Axillary, resp. rate 18, height 5\' 11"  (1.803 m), weight 135.5 kg (298 lb 11.6 oz), SpO2 95.00%. Awake and alert. Oriented and appropriate. Cranial nerve function intact. Motor and sensory function of the extremities normal. Wounds clean and dry. Chest and abdomen benign.  Disposition: 01-Home or Self Care   Future Appointments Provider Department Dept Phone   03/30/2013 9:00 AM Barbaraann Share, MD [facility] [phone]       Medication List    TAKE these medications       aspirin EC 81 MG tablet  Take 81 mg by mouth daily.     cyclobenzaprine 10 MG tablet  Commonly known as:  FLEXERIL  Take 10 mg by mouth daily as needed for muscle spasms.     docusate sodium 100 MG capsule  Commonly known as:  COLACE  Take 100 mg by mouth daily.     Fish Oil 1200 MG Caps  Take 1 capsule by mouth daily.     fluticasone 50 MCG/ACT nasal spray  Commonly known as:  FLONASE  Place 2 sprays into the nose daily.     hydrochlorothiazide 25 MG tablet  Commonly known as:  HYDRODIURIL  Take 25 mg by mouth daily.     losartan 100 MG tablet  Commonly known as:  COZAAR  Take 100 mg by mouth daily.     magic mouthwash Soln   Take 5 mLs by mouth 4 (four) times daily as needed.     methylPREDNISolone 4 MG tablet  Commonly known as:  MEDROL DOSEPAK  follow package directions     multivitamins ther. w/minerals Tabs  Take 1 tablet by mouth daily.     nortriptyline 25 MG capsule  Commonly known as:  PAMELOR  Take 1 capsule (25 mg total) by mouth at bedtime.     oxybutynin 10 MG 24 hr tablet  Commonly known as:  DITROPAN-XL  Take 10 mg by mouth daily.     oxyCODONE-acetaminophen 5-325 MG per tablet  Commonly known as:  PERCOCET/ROXICET  Take 1 tablet by mouth daily as needed for pain. pain     PARoxetine 20 MG tablet  Commonly known as:  PAXIL  Take 20 mg by mouth daily.     triamcinolone 0.1 % cream : eucerin Crea  Apply 1 application topically 2 (two) times daily.           Follow-up Information   Follow up with Dillard Cannon, MD In 2 days. (Follow up with Dr Ezzard Standing on Mon at [facility] information:   [address] [place] [address] [phone]       Follow up with [name], MD. Call in 1 week.   Contact information:   [address] [facility]  [place] [address] [phone]       Signed: Korbyn Vanes A 11/11/2012, 11:59 AM

## 2012-11-14 ENCOUNTER — Other Ambulatory Visit: Payer: Self-pay | Admitting: Internal Medicine

## 2012-11-23 ENCOUNTER — Other Ambulatory Visit: Payer: Self-pay | Admitting: Neurosurgery

## 2012-11-23 DIAGNOSIS — D352 Benign neoplasm of pituitary gland: Secondary | ICD-10-CM

## 2012-11-28 ENCOUNTER — Other Ambulatory Visit: Payer: Self-pay | Admitting: Internal Medicine

## 2012-11-29 ENCOUNTER — Ambulatory Visit
Admission: RE | Admit: 2012-11-29 | Discharge: 2012-11-29 | Disposition: A | Discharge status: Home / Self Care | Payer: Medicare Other | Source: Ambulatory Visit | Attending: Neurosurgery | Admitting: Neurosurgery

## 2012-11-29 ENCOUNTER — Other Ambulatory Visit: Payer: Self-pay | Admitting: Family

## 2012-11-29 DIAGNOSIS — D352 Benign neoplasm of pituitary gland: Secondary | ICD-10-CM

## 2012-11-29 MED ORDER — GADOBENATE DIMEGLUMINE 529 MG/ML IV SOLN
10.0000 mL | Freq: Once | INTRAVENOUS | Status: AC | PRN
  Administered 2012-11-29: 10 mL via INTRAVENOUS

## 2012-12-27 ENCOUNTER — Ambulatory Visit (INDEPENDENT_AMBULATORY_CARE_PROVIDER_SITE_OTHER): Payer: Medicare Other | Admitting: Family

## 2012-12-27 ENCOUNTER — Encounter: Payer: Self-pay | Admitting: Family

## 2012-12-27 VITALS — BP 112/76 | HR 107 | Temp 98.8°F | Wt 291.2 lb

## 2012-12-27 DIAGNOSIS — J209 Acute bronchitis, unspecified: Secondary | ICD-10-CM

## 2012-12-27 DIAGNOSIS — R062 Wheezing: Secondary | ICD-10-CM

## 2012-12-27 MED ORDER — PREDNISONE 20 MG PO TABS: ORAL_TABLET | ORAL | Status: AC

## 2012-12-27 MED ORDER — GUAIFENESIN-CODEINE 100-10 MG/5ML PO SYRP: 5.0000 mL | ORAL_SOLUTION | Freq: Three times a day (TID) | ORAL | Status: DC | PRN

## 2012-12-27 NOTE — Patient Instructions (Addendum)

## 2012-12-27 NOTE — Progress Notes (Signed)
Subjective:    Patient ID: Wayne Booth, male    DOB: 05-Jul-1940, 72 y.o.   MRN: [id number]  HPI  72 year old white male, nonsmoker is in today with complaints of cough, chest congestion x3 days and worsening. Has a productive cough with white phlegm. Has been taken over-the-counter Mucinex with no relief.  Review of Systems  Constitutional: Negative.   HENT: Negative.   Eyes: Negative.   Respiratory: Positive for shortness of breath and wheezing. Negative for apnea and stridor.   Cardiovascular: Negative.   Gastrointestinal: Negative.   Musculoskeletal: Negative.   Skin: Negative.   Allergic/Immunologic: Negative.   Neurological: Negative.   Psychiatric/Behavioral: Negative.    Past Medical History  Diagnosis Date  . History of total knee replacement   . Hypertension   . Kidney stone   . Cancer   . Prostate cancer   . Kidney cancer, primary, with metastasis from kidney to other site   . Anxiety   . Pneumonia     hx of  . OSA (obstructive sleep apnea)     cpap at night  . Arthritis   . Pituitary cyst     History   Social History  . Marital Status: Married    Spouse Name: Randa Evens    Number of Children: Y  . Years of Education: N/A   Occupational History  . retired Surveyor, mining    Social History Main Topics  . Smoking status: Current Some Day Smoker -- 4 years    Types: Cigars  . Smokeless tobacco: Never Used     Comment: 1-2 cigars per week  . Alcohol Use: 1.2 oz/week    2 Shots of liquor per week     Comment: socially  . Drug Use: No  . Sexually Active: Not on file   Other Topics Concern  . Not on file   Social History Narrative  . No narrative on file    Past Surgical History  Procedure Laterality Date  . Appendectomy    . Cholecystectomy    . Nephrectomy Left   . Insertion prostate radiation seed    . Replacement total knee bilateral    . Knee arthroscopy  2007    left  . Anterior cervical decomp/discectomy fusion N/A  08/29/2012    Procedure: ANTERIOR CERVICAL DECOMPRESSION/DISCECTOMY FUSION 1 LEVEL;  Surgeon: Tia Alert, MD;  Location: [facility];  Service: Neurosurgery;  Laterality: N/A;  . Craniotomy N/A 11/08/2012    Procedure: CRANIOTOMY HYPOPHYSECTOMY TRANSNASAL APPROACH;  Surgeon: [name], MD;  Location: [facility];  Service: Neurosurgery;  Laterality: N/A;  Transphenoidal Resection of Pituitary Tumor    Family History  Problem Relation Age of Onset  . Cancer Father     ?mouth  . Cancer Mother     stomach  . Kidney cancer Sister   . Colon cancer Brother     No Known Allergies  Current Outpatient Prescriptions on File Prior to Visit  Medication Sig Dispense Refill  . aspirin EC 81 MG tablet Take 81 mg by mouth daily.      . cyclobenzaprine (FLEXERIL) 10 MG tablet Take 10 mg by mouth daily as needed for muscle spasms.      . hydrochlorothiazide (HYDRODIURIL) 25 MG tablet TAKE ONE TABLET BY MOUTH EVERY MORNING  90 tablet  0  . losartan (COZAAR) 100 MG tablet TAKE ONE TABLET BY MOUTH DAILY  90 tablet  1  . Multiple Vitamins-Minerals (MULTIVITAMINS THER. W/MINERALS) TABS Take  1 tablet by mouth daily.      . nortriptyline (PAMELOR) 25 MG capsule TAKE 1 CAPSULE (25 MG TOTAL) BY MOUTH AT BEDTIME.  30 capsule  2  . Omega-3 Fatty Acids (FISH OIL) 1200 MG CAPS Take 1 capsule by mouth daily.      Marland Kitchen oxybutynin (DITROPAN-XL) 10 MG 24 hr tablet Take 10 mg by mouth daily.      Marland Kitchen oxyCODONE-acetaminophen (PERCOCET/ROXICET) 5-325 MG per tablet Take 1 tablet by mouth daily as needed for pain. pain  60 tablet  0  . PARoxetine (PAXIL) 20 MG tablet Take 20 mg by mouth daily.      Marland Kitchen triamcinolone cream (KENALOG) 0.1 % APPLY 1 APPLICATION TOPICALLY 2 (TWO) TIMES DAILY.  85.2 g  0  . Alum & Mag Hydroxide-Simeth (MAGIC MOUTHWASH) SOLN Take 5 mLs by mouth 4 (four) times daily as needed.  120 mL  0  . docusate sodium (COLACE) 100 MG capsule Take 100 mg by mouth daily.      . fluticasone (FLONASE) 50 MCG/ACT  nasal spray Place 2 sprays into the nose daily.  16 g  6   No current facility-administered medications on file prior to visit.    BP 112/76  Pulse 107  Temp(Src) 98.8 F (37.1 C) (Oral)  Wt 291 lb 3.2 oz (132.087 kg)  BMI 40.63 kg/m2  SpO2 97%chart    Objective:   Physical Exam  Constitutional: He is oriented to person, place, and time. He appears well-developed and well-nourished.  HENT:  Head: Normocephalic.  Right Ear: External ear normal.  Left Ear: External ear normal.  Nose: Nose normal.  Mouth/Throat: Oropharynx is clear and moist.  Cardiovascular: Normal rate, regular rhythm and normal heart sounds.   Pulmonary/Chest: Effort normal. He has wheezes.  Wheezing noted throughout  Abdominal: Soft. Bowel sounds are normal.  Musculoskeletal: Normal range of motion.  Neurological: He is alert and oriented to person, place, and time.  Skin: Skin is warm and dry.  Psychiatric: He has a normal mood and affect.          Assessment & Plan:  Assessment: 1. Cough 2. Acute bronchitis  Plan: Prednisone 60x3, 40x3, 20x3. Rest. Drink plenty of fluids. Robitussin with codeine as needed for cough. Recheck a schedule, and as needed. will

## 2012-12-31 ENCOUNTER — Ambulatory Visit (INDEPENDENT_AMBULATORY_CARE_PROVIDER_SITE_OTHER): Payer: Medicare Other | Admitting: Family Medicine

## 2012-12-31 ENCOUNTER — Encounter: Payer: Self-pay | Admitting: Family Medicine

## 2012-12-31 VITALS — BP 124/84 | HR 88 | Temp 97.6°F | Wt 291.0 lb

## 2012-12-31 DIAGNOSIS — J209 Acute bronchitis, unspecified: Secondary | ICD-10-CM

## 2012-12-31 MED ORDER — GUAIFENESIN-CODEINE 100-10 MG/5ML PO SYRP: 5.0000 mL | ORAL_SOLUTION | Freq: Three times a day (TID) | ORAL | Status: DC | PRN

## 2012-12-31 MED ORDER — METHYLPREDNISOLONE ACETATE 80 MG/ML IJ SUSP
80.0000 mg | Freq: Once | INTRAMUSCULAR | Status: AC
  Administered 2012-12-31: 80 mg via INTRAMUSCULAR

## 2012-12-31 MED ORDER — AZITHROMYCIN 250 MG PO TABS: ORAL_TABLET | ORAL | Status: DC

## 2012-12-31 MED ORDER — ALBUTEROL SULFATE (5 MG/ML) 0.5% IN NEBU
2.5000 mg | INHALATION_SOLUTION | Freq: Once | RESPIRATORY_TRACT | Status: AC
  Administered 2012-12-31: 2.5 mg via RESPIRATORY_TRACT

## 2012-12-31 MED ORDER — ALBUTEROL SULFATE HFA 108 (90 BASE) MCG/ACT IN AERS: 2.0000 | INHALATION_SPRAY | Freq: Four times a day (QID) | RESPIRATORY_TRACT | Status: DC | PRN

## 2012-12-31 NOTE — Progress Notes (Signed)
  Subjective:     Wayne Booth is a 72 y.o. male here for evaluation of a cough. Onset of symptoms was 1 week ago. Symptoms have been gradually worsening since that time. The cough is barky and productive and is aggravated by infection and pollens. Associated symptoms include: shortness of breath, sputum production and wheezing. Patient does have a history of asthma. Patient does not have a history of environmental allergens. Patient has not traveled recently. Patient does not have a history of smoking. Patient has not had a previous chest x-ray. Patient has not had a PPD done.  Pt was seen 3 days ago and given pred pack and cough med.  He was better initially but then symptoms worsened again.  The following portions of the patient's history were reviewed and updated as appropriate: allergies, current medications, past family history, past medical history, past social history, past surgical history and problem list.  Review of Systems Pertinent items are noted in HPI.    Objective:    Oxygen saturation 97% on room air BP 124/84  Pulse 88  Temp(Src) 97.6 F (36.4 C) (Oral)  Wt 291 lb (131.997 kg)  BMI 40.6 kg/m2  SpO2 97% General appearance: alert, cooperative, appears stated age and no distress Ears: normal TM and external ear canal left ear Nose: Nares normal. Septum midline. Mucosa normal. No drainage or sinus tenderness. Throat: lips, mucosa, and tongue normal; teeth and gums normal Neck: no adenopathy, supple, symmetrical, trachea midline and thyroid not enlarged, symmetric, no tenderness/mass/nodules Lungs: wheezes bilaterally Heart: S1, S2 normal Extremities: extremities normal, atraumatic, no cyanosis or edema    Assessment:    Acute Bronchitis    Plan:    Antibiotics per medication orders. Antitussives per medication orders. Avoid exposure to tobacco smoke and fumes. B-agonist inhaler. Call if shortness of breath worsens, blood in sputum, change in character of cough,  development of fever or chills, inability to maintain nutrition and hydration. Avoid exposure to tobacco smoke and fumes. cont cough med prn and pred taper  F/u pcp 1 week

## 2012-12-31 NOTE — Patient Instructions (Signed)

## 2013-01-02 ENCOUNTER — Telehealth: Payer: Self-pay | Admitting: Family

## 2013-01-02 NOTE — Telephone Encounter (Signed)
[address] [place], [place] [address] p. [phone] f. [phone] To: -[facility] (After Hours Triage) Fax: [phone] From: [facility] Date/ Time: 12/30/2012 6:03 PM Taken By: Cora Collum, RN Caller: Windy Fast Facility: Not Collected Patient: Wayne Booth, Wayne Booth DOB: 1940-10-04 Phone: [phone] Reason for Call: Caller was unable to be reached on callback - Left Message Regarding Appointment: No Appt Date: Appt Time: Unknown Provider: Reason: Details: Outcome:

## 2013-01-02 NOTE — Telephone Encounter (Signed)
[facility] Triage Call Report Triage Record Num: [id number] Operator: Chevis Pretty Patient Name: Wayne Booth Call Date & Time: 12/31/2012 10:13:33AM Patient Phone: [phone] PCP: Valetta Mole. [facility] Patient Gender: Male PCP Fax : [phone] Patient DOB: 1941-05-28 Practice Name: [facility] Reason for Call: Caller: [place]/Patient; PCP: Birdie Sons (Adults only); CB#: [phone]; Call regarding Wheezing; seen in office 12/27/12 for bronchitis and wheeze, and given steroid dosepack and cough syrup. States the cough and wheeze keep him from sleeping. Afebrile. Per cough protocol, advised appt today; appt scheduled 1100 12/31/12 with Dr. Laury Axon at [facility] krs/can Protocol(s) Used: Cough - Adult Recommended Outcome per Protocol: Call Provider within 8 Hours Reason for Outcome: Being treated by a provider for a secondary infection AND no improvement in symptoms, symptoms have worsened OR has new symptoms after following treatment plan for the time specified by provider. Care Advice: ~ Avoid any activity that produces symptoms until evaluated by provider. ~ Continue to follow treatment plan, including medications, until evaluated by provider. Go to ED IMMEDIATELY if developing increased shortness of breath, continuous cough, worsening fatigue, or unable to perform ADLs. ~ Drink more fluids -- water, low-sugar juices, tea and warm soup, especially chicken broth, are options. Avoid caffeinated or alcoholic beverages because they can increase the chance of dehydration. ~ ~ SYMPTOM / CONDITION MANAGEMENT ~ CAUTIONS Medication Advice: - Discontinue all nonprescription and alternative medications, especially stimulants, until evaluated by provider. - Take prescribed medications as directed, following label instructions for the medication. - Do not change medications or dosing regimen until provider is consulted. - Know possible side effects of medication and what to do if they  occur. - Tell provider all prescription, nonprescription or alternative medications that you take ~ Respiratory Hygiene: - Cover the nose/mouth tightly with a tissue when coughing or sneezing. - Use tissue 1 time and discard in the nearest waste receptacle. - Wash hands with soap and water or alcohol-based hand rub after coming into contact with respiratory secretions and contaminated objects/materials. - Alternatively when no tissue is available, cough into the bend of the elbow. - Avoid touching your eyes, nose or mouth. ~ See your provider today if you have a temperature 101.52F (38.6C) or higher, increased difficulty breathing, chest pain with cough, or cough with blood-tinged sputum. ~ Total water intake includes drinking water, water in beverages, and water contained in food. Fluids make up about 80% of the body's total hydration need. Individual fluid requirement to maintain hydration vary based on physical activity, environmental factors and illness. Limit fluids that contain sugar, caffeine, or alcohol. Urine will be very light yellow color when you drink enough fluids. ~ 12/31/2012 10:18:55AM Page 1 of 1 [id number]

## 2013-01-13 ENCOUNTER — Encounter (HOSPITAL_BASED_OUTPATIENT_CLINIC_OR_DEPARTMENT_OTHER): Payer: Medicare Other

## 2013-01-18 ENCOUNTER — Other Ambulatory Visit: Payer: Self-pay | Admitting: Internal Medicine

## 2013-01-20 ENCOUNTER — Other Ambulatory Visit (HOSPITAL_COMMUNITY): Payer: Self-pay | Admitting: Urology

## 2013-01-20 ENCOUNTER — Ambulatory Visit (HOSPITAL_COMMUNITY)
Admission: RE | Admit: 2013-01-20 | Discharge: 2013-01-20 | Disposition: A | Discharge status: Home / Self Care | Payer: Medicare Other | Source: Ambulatory Visit | Attending: Urology | Admitting: Urology

## 2013-01-20 DIAGNOSIS — C649 Malignant neoplasm of unspecified kidney, except renal pelvis: Secondary | ICD-10-CM

## 2013-01-25 ENCOUNTER — Ambulatory Visit: Payer: Medicare Other | Admitting: Family

## 2013-01-27 ENCOUNTER — Other Ambulatory Visit: Payer: Self-pay | Admitting: Internal Medicine

## 2013-02-14 ENCOUNTER — Other Ambulatory Visit: Payer: Self-pay | Admitting: Internal Medicine

## 2013-03-17 ENCOUNTER — Other Ambulatory Visit: Payer: Self-pay | Admitting: Internal Medicine

## 2013-03-22 ENCOUNTER — Ambulatory Visit (INDEPENDENT_AMBULATORY_CARE_PROVIDER_SITE_OTHER): Payer: Medicare Other

## 2013-03-22 DIAGNOSIS — Z23 Encounter for immunization: Secondary | ICD-10-CM

## 2013-03-28 ENCOUNTER — Other Ambulatory Visit: Payer: Self-pay | Admitting: Neurosurgery

## 2013-03-28 DIAGNOSIS — D352 Benign neoplasm of pituitary gland: Secondary | ICD-10-CM

## 2013-03-30 ENCOUNTER — Ambulatory Visit: Payer: Medicare Other | Admitting: Pulmonary Disease

## 2013-04-07 ENCOUNTER — Other Ambulatory Visit: Payer: Medicare Other

## 2013-04-10 ENCOUNTER — Ambulatory Visit
Admission: RE | Admit: 2013-04-10 | Discharge: 2013-04-10 | Disposition: A | Discharge status: Home / Self Care | Payer: Medicare Other | Source: Ambulatory Visit | Attending: Neurosurgery | Admitting: Neurosurgery

## 2013-04-10 DIAGNOSIS — D352 Benign neoplasm of pituitary gland: Secondary | ICD-10-CM

## 2013-04-10 MED ORDER — GADOBENATE DIMEGLUMINE 529 MG/ML IV SOLN
10.0000 mL | Freq: Once | INTRAVENOUS | Status: AC | PRN
  Administered 2013-04-10: 10 mL via INTRAVENOUS

## 2013-04-20 ENCOUNTER — Other Ambulatory Visit: Payer: Self-pay | Admitting: Internal Medicine

## 2013-05-19 ENCOUNTER — Other Ambulatory Visit: Payer: Self-pay | Admitting: Internal Medicine

## 2013-05-24 ENCOUNTER — Other Ambulatory Visit: Payer: Self-pay | Admitting: Internal Medicine

## 2013-06-19 ENCOUNTER — Other Ambulatory Visit: Payer: Self-pay | Admitting: Internal Medicine

## 2013-06-19 ENCOUNTER — Other Ambulatory Visit: Payer: Self-pay | Admitting: Family

## 2013-07-01 ENCOUNTER — Other Ambulatory Visit: Payer: Self-pay | Admitting: Internal Medicine

## 2013-07-11 ENCOUNTER — Ambulatory Visit (INDEPENDENT_AMBULATORY_CARE_PROVIDER_SITE_OTHER): Payer: Medicare Other | Admitting: Internal Medicine

## 2013-07-11 ENCOUNTER — Encounter: Payer: Self-pay | Admitting: Internal Medicine

## 2013-07-11 VITALS — BP 124/82 | HR 84 | Temp 98.7°F | Ht 71.0 in | Wt 283.0 lb

## 2013-07-11 DIAGNOSIS — I10 Essential (primary) hypertension: Secondary | ICD-10-CM

## 2013-07-11 DIAGNOSIS — C649 Malignant neoplasm of unspecified kidney, except renal pelvis: Secondary | ICD-10-CM

## 2013-07-11 MED ORDER — PAROXETINE HCL 20 MG PO TABS: 20.0000 mg | ORAL_TABLET | Freq: Every day | ORAL | Status: DC

## 2013-07-11 MED ORDER — NORTRIPTYLINE HCL 10 MG PO CAPS: 10.0000 mg | ORAL_CAPSULE | Freq: Every day | ORAL | Status: DC

## 2013-07-11 NOTE — Progress Notes (Signed)
Pre visit review using our clinic review tool, if applicable. No additional management support is needed unless otherwise documented below in the visit note. 

## 2013-07-11 NOTE — Progress Notes (Signed)
Multiple medical problems He is really here only for refill of nortriptyline and paxil.  Renal cell and prostate ca-- sees urology at least yearly.  htn- tolerating meds  Hyperglycemia- needs f/u  Past Medical History  Diagnosis Date  . History of total knee replacement   . Hypertension   . Kidney stone   . Cancer   . Prostate cancer   . Kidney cancer, primary, with metastasis from kidney to other site   . Anxiety   . Pneumonia     hx of  . OSA (obstructive sleep apnea)     cpap at night  . Arthritis   . Pituitary cyst     History   Social History  . Marital Status: Married    Spouse Name: [name]    Number of Children: Y  . Years of Education: N/A   Occupational History  . retired Buyer, retail    Social History Main Topics  . Smoking status: Current Some Day Smoker -- 4 years    Types: Cigars  . Smokeless tobacco: Never Used     Comment: 1-2 cigars per week  . Alcohol Use: 1.2 oz/week    2 Shots of liquor per week     Comment: socially  . Drug Use: No  . Sexual Activity: Not on file   Other Topics Concern  . Not on file   Social History Narrative  . No narrative on file    Past Surgical History  Procedure Laterality Date  . Appendectomy    . Cholecystectomy    . Nephrectomy Left   . Insertion prostate radiation seed    . Replacement total knee bilateral    . Knee arthroscopy  2007    left  . Anterior cervical decomp/discectomy fusion N/A 08/29/2012    Procedure: ANTERIOR CERVICAL DECOMPRESSION/DISCECTOMY FUSION 1 LEVEL;  Surgeon: Eustace Moore, MD;  Location: [facility];  Service: Neurosurgery;  Laterality: N/A;  . Craniotomy N/A 11/08/2012    Procedure: CRANIOTOMY HYPOPHYSECTOMY TRANSNASAL APPROACH;  Surgeon: Faythe Ghee, MD;  Location: [facility];  Service: Neurosurgery;  Laterality: N/A;  Transphenoidal Resection of Pituitary Tumor    Family History  Problem Relation Age of Onset  . Cancer Father     ?mouth  . Cancer  Mother     stomach  . Kidney cancer Sister   . Colon cancer Brother     No Known Allergies  Current Outpatient Prescriptions on File Prior to Visit  Medication Sig Dispense Refill  . aspirin EC 81 MG tablet Take 81 mg by mouth daily.      . hydrochlorothiazide (HYDRODIURIL) 25 MG tablet TAKE ONE TABLET BY MOUTH EVERY MORNING  90 tablet  0  . losartan (COZAAR) 100 MG tablet TAKE ONE TABLET BY MOUTH DAILY  90 tablet  1  . Multiple Vitamins-Minerals (MULTIVITAMINS THER. W/MINERALS) TABS Take 1 tablet by mouth daily.      . nortriptyline (PAMELOR) 25 MG capsule TAKE 1 CAPSULE (25 MG TOTAL) BY MOUTH AT BEDTIME.  30 capsule  0  . Omega-3 Fatty Acids (FISH OIL) 1200 MG CAPS Take 1 capsule by mouth daily.      Marland Kitchen PARoxetine (PAXIL) 20 MG tablet TAKE 1 TABLET (20 MG TOTAL) BY MOUTH DAILY.  30 tablet  0  . triamcinolone cream (KENALOG) 0.1 % APPLY 1 APPLICATION TOPICALLY 2 (TWO) TIMES DAILY.  85.2 g  0   No current facility-administered medications on file prior to visit.  patient denies chest pain, shortness of breath, orthopnea. Denies lower extremity edema, abdominal pain, change in appetite, change in bowel movements. Patient denies rashes, musculoskeletal complaints. No other specific complaints in a complete review of systems.   BP 124/82  Pulse 84  Temp(Src) 98.7 F (37.1 C) (Oral)  Ht 5\' 11"  (1.803 m)  Wt 283 lb (128.368 kg)  BMI 39.49 kg/m2  well-developed morbidly male in no acute distress. HEENT exam atraumatic, normocephalic, neck supple without jugular venous distention. Chest clear to auscultation cardiac exam S1-S2 are regular. Abdominal exam overweight with bowel sounds, soft and nontender. Extremities no edema. Neurologic exam is alert with a normal gait.   He just had labs from [employer]- he will make sure i get a copy  Med refill- done at [facility] but he needs to lose a lot of weight Goal bmi <25  CARCINOMA, RENAL CELL Has regular f/u  with urology

## 2013-07-12 NOTE — Assessment & Plan Note (Signed)
[facility] but he needs to lose a lot of weight Goal bmi <25

## 2013-07-12 NOTE — Assessment & Plan Note (Signed)
Has regular f/u with urology 

## 2013-07-29 ENCOUNTER — Telehealth: Payer: Self-pay | Admitting: Family

## 2013-07-29 NOTE — Telephone Encounter (Signed)
Relevant patient education mailed to patient.  

## 2013-07-31 ENCOUNTER — Other Ambulatory Visit: Payer: Self-pay | Admitting: Internal Medicine

## 2013-08-01 ENCOUNTER — Encounter: Payer: Self-pay | Admitting: Pulmonary Disease

## 2013-08-01 ENCOUNTER — Ambulatory Visit (INDEPENDENT_AMBULATORY_CARE_PROVIDER_SITE_OTHER): Payer: Medicare Other | Admitting: Pulmonary Disease

## 2013-08-01 VITALS — BP 124/86 | HR 88 | Temp 97.9°F | Ht 71.0 in | Wt 289.0 lb

## 2013-08-01 DIAGNOSIS — G4733 Obstructive sleep apnea (adult) (pediatric): Secondary | ICD-10-CM

## 2013-08-01 NOTE — Progress Notes (Signed)
   Subjective:    Patient ID: Wayne Booth, male    DOB: [birth year], 73 y.o.   MRN: [id number]  HPI The patient comes in today for followup of his obstructive sleep apnea. He is wearing CPAP compliantly, and is having no issues with his pressure. He is having occasional mask leaks, and would like to look into a wider her CPAP mask. He feels that he is sleeping well, and is satisfied with his daytime alertness. Of note, the patient's weight is up 13 pounds since last visit.   Review of Systems  Constitutional: Negative for fever and unexpected weight change.  HENT: Negative for congestion, dental problem, ear pain, nosebleeds, postnasal drip, rhinorrhea, sinus pressure, sneezing, sore throat and trouble swallowing.   Eyes: Negative for redness and itching.  Respiratory: Negative for cough, chest tightness, shortness of breath and wheezing.   Cardiovascular: Negative for palpitations and leg swelling.  Gastrointestinal: Negative for nausea and vomiting.  Genitourinary: Negative for dysuria.  Musculoskeletal: Negative for joint swelling.  Skin: Negative for rash.  Neurological: Negative for headaches.  Hematological: Does not bruise/bleed easily.  Psychiatric/Behavioral: Negative for dysphoric mood. The patient is not nervous/anxious.        Objective:   Physical Exam Obese male in no acute distress Nose without purulence or discharge noted No skin breakdown or pressure necrosis from the CPAP mask Neck without lymphadenopathy or thyromegaly Lower extremities with mild edema, no cyanosis Alert and oriented, does not appear to be sleepy, moves all 4 extremities.       Assessment & Plan:

## 2013-08-01 NOTE — Patient Instructions (Signed)
Will send an order to your home care company to work with you on the mask fit. Work on weight loss. followup with me again in one year if doing well.

## 2013-08-01 NOTE — Assessment & Plan Note (Signed)
The patient feels that he is doing well with CPAP on his current setting, and is satisfied with his sleep and daytime alertness. He would like to look a better fitting mask, and we'll send an order to his home care company. I have asked him to keep up with his mask changes and supplies, and to work aggressively on weight loss.

## 2013-08-08 ENCOUNTER — Encounter: Payer: Self-pay | Admitting: Internal Medicine

## 2013-08-08 ENCOUNTER — Ambulatory Visit (INDEPENDENT_AMBULATORY_CARE_PROVIDER_SITE_OTHER): Payer: Medicare Other | Admitting: Internal Medicine

## 2013-08-08 VITALS — BP 110/70 | HR 107 | Temp 98.1°F | Resp 13 | Ht 71.5 in | Wt 286.0 lb

## 2013-08-08 DIAGNOSIS — H01009 Unspecified blepharitis unspecified eye, unspecified eyelid: Secondary | ICD-10-CM

## 2013-08-08 DIAGNOSIS — H01004 Unspecified blepharitis left upper eyelid: Secondary | ICD-10-CM

## 2013-08-08 MED ORDER — ERYTHROMYCIN 5 MG/GM OP OINT: 1.0000 "application " | TOPICAL_OINTMENT | Freq: Four times a day (QID) | OPHTHALMIC | Status: DC

## 2013-08-08 MED ORDER — CEPHALEXIN 500 MG PO CAPS: 500.0000 mg | ORAL_CAPSULE | Freq: Two times a day (BID) | ORAL | Status: DC

## 2013-08-08 NOTE — Progress Notes (Signed)
   Subjective:    Patient ID: Wayne Booth, male    DOB: July 17, 1940, 73 y.o.   MRN: [id number]  HPI Sunday 07/06/13 afternoon felt sensation of something in L eye without trauma, injury or foreign body.  Reports yellow watery discharge with crusting noted upon waking on Monday. Notes painful sensation in L eye with  some blurred vision. No recent sick contacts nor recent URI symptoms.  Patient utilizes CPAP for sleep apnea and reports adequate cleaning techniques.    Review of Systems Denies fever, chills, weight loss, n/v. Denies sneezing, coughing, nasal discharge. Denies peripheral vision deficits.      Objective:   Physical Exam General appearance:good health ;well nourished; no acute distress or increased work of breathing is present.  No  lymphadenopathy about the head, neck, or axilla noted.   Eyes: Left eye conjunctival inflammation with upper lid edema present. Watery discharge noted in L eye. Bilateral EOM intact; bilateral peripheral vision unaffected. There is no scleral icterus.   Ears:  External ear exam shows no significant lesions or deformities.  Otoscopic examination reveals bilateral soft ear cerumen; tympanic membranes are intact bilaterally without bulging, retraction, inflammation or discharge.  Nose:  External nasal examination shows no deformity or inflammation. Nasal mucosa are pink and moist without lesions or exudates. No septal dislocation or deviation. No obstruction to airflow.   Oral exam: Dental hygiene is good; lips and gums are healthy appearing.There is no oropharyngeal erythema or exudate noted, although tonsillar pillar crowding noted.   Neck:  No deformities, thyromegaly, masses, or tenderness noted. Supple with full range of motion without pain.   Heart:  Normal rate and regular rhythm. S1 and S2 normal without gallop, murmur, click, rub or other extra sounds.   Lungs:Chest clear to auscultation; no wheezes, rhonchi,rales ,or rubs present.No  increased work of breathing.    Extremities:  No cyanosis, edema, or clubbing noted.   Skin: Warm & dry w/o jaundice or tenting.        Assessment & Plan:  #1 conjunctivitis L eye  # See orders

## 2013-08-08 NOTE — Patient Instructions (Signed)
Use warm moist compresses to 3 -4 times a day to the affected area.

## 2013-08-08 NOTE — Progress Notes (Signed)
Pre-visit discussion using our clinic review tool. No additional management support is needed unless otherwise documented below in the visit note.  

## 2013-08-08 NOTE — Progress Notes (Signed)
   Subjective:    Patient ID: Wayne Booth, male    DOB: 18-Apr-1941, 73 y.o.   MRN: [id number]  HPI Sunday 07/06/13 afternoon he  felt sensation of something in L eye without trauma, injury or foreign body.  Reports yellow watery discharge with crusting noted upon waking on Monday. Notes painful sensation in L eye with  some blurred vision. No recent sick contacts nor recent URI symptoms.  Patient utilizes CPAP for sleep apnea and reports adequate cleaning techniques.    Review of Systems Denies fever, chills, weight loss, n/v. Denies sneezing, coughing, nasal discharge. Denies peripheral vision deficits.       Objective:   Physical Exam General appearance:good health ;well nourished; no acute distress or increased work of breathing is present. No lymphadenopathy about the head, neck, or axilla noted.  Eyes: Left eye with upper lid  Erythema & edema present. Watery discharge noted in L eye. Bilateral EOM intact; bilateral peripheral vision unaffected. There is no scleral icterus. 68mm pupil bilateral.  Ears: External ear exam shows no significant lesions or deformities. Otoscopic examination reveals bilateral soft ear cerumen; tympanic membranes are intact bilaterally without bulging, retraction, inflammation or discharge.  Nose: External nasal examination shows no deformity or inflammation. Nasal mucosa are pink and moist without lesions or exudates. No septal dislocation or deviation. No obstruction to airflow.  Oral exam: Dental hygiene is good; lips and gums are healthy appearing.There is  oropharyngeal erythema ; no exudate noted. Tonsillar pillar crowding noted.  Neck: No deformities, thyromegaly, masses, or tenderness noted. Supple with full range of motion without pain.  Heart: Normal rate  (recheck 89) and regular rhythm. S1 and S2 normal without gallop, murmur, click, rub or other extra sounds.  Lungs:Chest clear to auscultation; no wheezes, rhonchi,rales ,or rubs present.No  increased work of breathing.  Extremities: No cyanosis, edema, or clubbing noted.  Skin: Warm & dry w/o jaundice or tenting.     Assessment & Plan:  #1 blepharitis L eye  See orders

## 2013-08-09 ENCOUNTER — Telehealth: Payer: Self-pay | Admitting: Family

## 2013-08-09 ENCOUNTER — Telehealth: Payer: Self-pay | Admitting: Pulmonary Disease

## 2013-08-09 ENCOUNTER — Encounter: Payer: Self-pay | Admitting: Internal Medicine

## 2013-08-09 DIAGNOSIS — G4733 Obstructive sleep apnea (adult) (pediatric): Secondary | ICD-10-CM

## 2013-08-09 NOTE — Telephone Encounter (Signed)
Insurance will not allow bilevel unless he fails cpap.  He may be having noises at night because his mask is not sealing and he is losing pressure.  Did he ever get a new mask that seals better?

## 2013-08-09 NOTE — Telephone Encounter (Signed)
Relevant patient education mailed to patient.  

## 2013-08-09 NOTE — Telephone Encounter (Signed)
Called spoke with pt. He reports he has been trying CPAP for over 1 year now. He has been reading up on a BPAP and wonders if this would help. Pt spouse still hears pt making noises at night. Please advise Dawn thanks

## 2013-08-10 NOTE — Telephone Encounter (Signed)
Spoke with pt. Reports that he did get a new mask, that fits under his chin. This mask is leaking as well. He wants to go over to the sleep center and be fitted for the correct mask.  Comern­o - are you okay with this? Thanks.

## 2013-08-10 NOTE — Telephone Encounter (Signed)
Ok with me.  [id number] 

## 2013-08-10 NOTE — Telephone Encounter (Signed)
Order has been placed for mask fitting. Pt is aware.

## 2013-08-22 ENCOUNTER — Other Ambulatory Visit (HOSPITAL_BASED_OUTPATIENT_CLINIC_OR_DEPARTMENT_OTHER): Payer: Medicare Other

## 2013-08-23 ENCOUNTER — Other Ambulatory Visit (HOSPITAL_BASED_OUTPATIENT_CLINIC_OR_DEPARTMENT_OTHER): Payer: Medicare Other

## 2013-08-23 ENCOUNTER — Telehealth: Payer: Self-pay | Admitting: Internal Medicine

## 2013-08-23 DIAGNOSIS — Z1211 Encounter for screening for malignant neoplasm of colon: Secondary | ICD-10-CM

## 2013-08-23 NOTE — Telephone Encounter (Signed)
Referral order placed.

## 2013-08-23 NOTE — Telephone Encounter (Signed)
Pt is needing a referral to the GI for a colonoscopy.

## 2013-08-28 ENCOUNTER — Ambulatory Visit (HOSPITAL_BASED_OUTPATIENT_CLINIC_OR_DEPARTMENT_OTHER): Payer: Medicare Other | Attending: Pulmonary Disease | Admitting: Radiology

## 2013-08-28 DIAGNOSIS — G4733 Obstructive sleep apnea (adult) (pediatric): Secondary | ICD-10-CM

## 2013-08-28 DIAGNOSIS — Z9989 Dependence on other enabling machines and devices: Principal | ICD-10-CM

## 2013-08-31 ENCOUNTER — Other Ambulatory Visit: Payer: Self-pay | Admitting: Neurosurgery

## 2013-08-31 DIAGNOSIS — D352 Benign neoplasm of pituitary gland: Secondary | ICD-10-CM

## 2013-09-04 ENCOUNTER — Telehealth: Payer: Self-pay | Admitting: Pulmonary Disease

## 2013-09-04 NOTE — Telephone Encounter (Signed)
LMTC x 1  

## 2013-09-05 NOTE — Telephone Encounter (Signed)
Spoke with patient; states he is still not able to use his CPAP machine; he went to sleep lab for mask fitting previously. Pt states that air continues to blow out of the mask and not holding pressure. Pt is ready to just quit using the CPAP all together; wonders what he should do next and if maybe BiPAP would work better for him(his friend has one and works well for him). Please advise. Thanks.

## 2013-09-05 NOTE — Telephone Encounter (Signed)
bipap requires a mask as well, and if mask leaks with cpap will leak with bipap as well. He could try nasal mask or nasal pillows, and add chin strap to help with mouth opening.  Can send order if he is agreeable to this.

## 2013-09-05 NOTE — Telephone Encounter (Signed)
lmtcb x1 

## 2013-09-06 NOTE — Telephone Encounter (Signed)
I spoke with the pt and advised. He states he is frustrated with the whole thing and just wants to stop the machine. He does not feel a mask changes is what is needed. I advised maybe would be best to come in for OV to discuss face to face exactly what is needed.  Appt set for 09-08-13 at 10:15. Stanley Bing, CMA

## 2013-09-07 ENCOUNTER — Ambulatory Visit
Admission: RE | Admit: 2013-09-07 | Discharge: 2013-09-07 | Disposition: A | Discharge status: Home / Self Care | Payer: Medicare Other | Source: Ambulatory Visit | Attending: Neurosurgery | Admitting: Neurosurgery

## 2013-09-07 DIAGNOSIS — D352 Benign neoplasm of pituitary gland: Secondary | ICD-10-CM

## 2013-09-07 MED ORDER — GADOBENATE DIMEGLUMINE 529 MG/ML IV SOLN
10.0000 mL | Freq: Once | INTRAVENOUS | Status: AC | PRN
  Administered 2013-09-07: 10 mL via INTRAVENOUS

## 2013-09-08 ENCOUNTER — Ambulatory Visit (INDEPENDENT_AMBULATORY_CARE_PROVIDER_SITE_OTHER): Payer: Medicare Other | Admitting: Pulmonary Disease

## 2013-09-08 ENCOUNTER — Encounter: Payer: Self-pay | Admitting: Pulmonary Disease

## 2013-09-08 VITALS — BP 118/80 | HR 87

## 2013-09-08 DIAGNOSIS — G4733 Obstructive sleep apnea (adult) (pediatric): Secondary | ICD-10-CM

## 2013-09-08 NOTE — Progress Notes (Signed)
   Subjective:    Patient ID: Wayne Booth, male    DOB: [birth year], 73 y.o.   MRN: [id number]  HPI The patient comes in today for trouble shooting of his CPAP issues. He is been unable to wear his device consistently because of breathing issues associated with exhalation pressures. He is using a rate of button successfully, but is unable to see and his respiratory pattern with the device. His wife states that he is struggling with the machine from a breathing standpoint all night long. He denies having any issues with his mask fit or tolerance.     Review of Systems  Constitutional: Negative for fever and unexpected weight change.  HENT: Negative for congestion, dental problem, ear pain, nosebleeds, postnasal drip, rhinorrhea, sinus pressure, sneezing, sore throat and trouble swallowing.   Eyes: Negative for redness and itching.  Respiratory: Negative for cough, chest tightness, shortness of breath and wheezing.   Cardiovascular: Negative for palpitations and leg swelling.  Gastrointestinal: Negative for nausea and vomiting.  Genitourinary: Negative for dysuria.  Musculoskeletal: Negative for joint swelling.  Skin: Negative for rash.  Neurological: Negative for headaches.  Hematological: Does not bruise/bleed easily.  Psychiatric/Behavioral: Negative for dysphoric mood. The patient is not nervous/anxious.        Objective:   Physical Exam Obese male in no acute distress Nose without purulence or discharge noted Neck without lymphadenopathy or thyromegaly No skin breakdown or pressure necrosis from the CPAP mask Lower extremities with mild edema, no cyanosis Alert and oriented, moves all 4 extremities.       Assessment & Plan:

## 2013-09-08 NOTE — Patient Instructions (Signed)
Will change your cpap machine to bipap.  Will start out at lower pressure, then increase after a few weeks.  Please let me know if having issues. Keep working on weight loss followup with me in 53mos if doing well, but see if you can bring by your chip for download once you have been on your machine for 4-6 weeks.

## 2013-09-08 NOTE — Assessment & Plan Note (Signed)
The patient is having a significant issue with pressure tolerance on CPAP. This is not a mask issues, but rather difficulty getting his breathing in sync with the device. I would like to change him over to bilevel and CPAP has improved tolerance. Also stressed to him the importance of aggressive weight loss.

## 2013-09-25 ENCOUNTER — Other Ambulatory Visit: Payer: Self-pay | Admitting: Family

## 2013-09-26 ENCOUNTER — Encounter: Payer: Self-pay | Admitting: Family Medicine

## 2013-09-26 ENCOUNTER — Ambulatory Visit (INDEPENDENT_AMBULATORY_CARE_PROVIDER_SITE_OTHER): Payer: Medicare Other | Admitting: Family Medicine

## 2013-09-26 ENCOUNTER — Telehealth: Payer: Self-pay | Admitting: Internal Medicine

## 2013-09-26 VITALS — BP 124/70 | Temp 99.1°F | Wt 288.0 lb

## 2013-09-26 DIAGNOSIS — Z1211 Encounter for screening for malignant neoplasm of colon: Secondary | ICD-10-CM

## 2013-09-26 DIAGNOSIS — G4700 Insomnia, unspecified: Secondary | ICD-10-CM

## 2013-09-26 DIAGNOSIS — R197 Diarrhea, unspecified: Secondary | ICD-10-CM

## 2013-09-26 NOTE — Telephone Encounter (Signed)
Relevant patient education mailed to patient.  

## 2013-09-26 NOTE — Progress Notes (Signed)
Chief Complaint  Patient presents with  . Diarrhea    x 10 days     HPI:  Diarrhea: -had a few days of watery diarrhea last week, then better then recurred the last few days -denies: blood in stools, fevers, abd pain, vomiting, melena, inability to tol PO, travel, antibiotics recently -wants to check on status of colonoscopy appt -wants to know about otc options for sleep as does not wish to take ambien prescribed in the past due to side effects  ROS: See pertinent positives and negatives per HPI.  Past Medical History  Diagnosis Date  . History of total knee replacement   . Hypertension   . Kidney stone   . Cancer   . Prostate cancer   . Kidney cancer, primary, with metastasis from kidney to other site   . Anxiety   . Pneumonia     hx of  . OSA (obstructive sleep apnea)     cpap at night  . Arthritis   . Pituitary cyst     Past Surgical History  Procedure Laterality Date  . Appendectomy    . Cholecystectomy    . Nephrectomy Left   . Insertion prostate radiation seed    . Replacement total knee bilateral    . Knee arthroscopy  2007    left  . Anterior cervical decomp/discectomy fusion N/A 08/29/2012    Procedure: ANTERIOR CERVICAL DECOMPRESSION/DISCECTOMY FUSION 1 LEVEL;  Surgeon: Eustace Moore, MD;  Location: [place] [facility];  Service: Neurosurgery;  Laterality: N/A;  . Craniotomy N/A 11/08/2012    Procedure: CRANIOTOMY HYPOPHYSECTOMY TRANSNASAL APPROACH;  Surgeon: Faythe Ghee, MD;  Location: [place];  Service: Neurosurgery;  Laterality: N/A;  Transphenoidal Resection of Pituitary Tumor    Family History  Problem Relation Age of Onset  . Cancer Father     ?mouth  . Cancer Mother     stomach  . Kidney cancer Sister   . Colon cancer Brother     History   Social History  . Marital Status: Married    Spouse Name: [name]    Number of Children: Y  . Years of Education: N/A   Occupational History  . retired Buyer, retail    Social History  Main Topics  . Smoking status: Current Some Day Smoker -- 4 years    Types: Cigars  . Smokeless tobacco: Never Used     Comment: 1-2 cigars per week  . Alcohol Use: 1.2 oz/week    2 Shots of liquor per week     Comment: socially  . Drug Use: No  . Sexual Activity: None   Other Topics Concern  . None   Social History Narrative  . None    Current outpatient prescriptions:aspirin EC 81 MG tablet, Take 81 mg by mouth daily., Disp: , Rfl: ;  hydrochlorothiazide (HYDRODIURIL) 25 MG tablet, TAKE ONE TABLET BY MOUTH EVERY MORNING, Disp: 30 tablet, Rfl: 0;  losartan (COZAAR) 100 MG tablet, TAKE ONE TABLET BY MOUTH DAILY, Disp: 90 tablet, Rfl: 1;  Multiple Vitamins-Minerals (MULTIVITAMINS THER. W/MINERALS) TABS, Take 1 tablet by mouth daily., Disp: , Rfl:  nortriptyline (PAMELOR) 25 MG capsule, Take 12.5 mg by mouth at bedtime., Disp: , Rfl: ;  Omega-3 Fatty Acids (FISH OIL) 1200 MG CAPS, Take 1 capsule by mouth daily., Disp: , Rfl: ;  PARoxetine (PAXIL) 20 MG tablet, TAKE 1 TABLET (20 MG TOTAL) BY MOUTH DAILY., Disp: 30 tablet, Rfl: 5;  levothyroxine (SYNTHROID, LEVOTHROID) 100 MCG  tablet, , Disp: , Rfl:   EXAM:  Filed Vitals:   09/26/13 1054  BP: 124/70  Temp: 99.1 F (37.3 C)    Body mass index is 39.61 kg/(m^2).  GENERAL: vitals reviewed and listed above, alert, oriented, appears well hydrated and in no acute distress  HEENT: atraumatic, conjunttiva clear, no obvious abnormalities on inspection of external nose and ears  NECK: no obvious masses on inspection  LUNGS: clear to auscultation bilaterally, no wheezes, rales or rhonchi, good air movement  CV: HRRR, no peripheral edema  MS: moves all extremities without noticeable abnormality  PSYCH: pleasant and cooperative, no obvious depression or anxiety  ASSESSMENT AND PLAN:  Discussed the following assessment and plan:  Diarrhea  Colon cancer screening  Insomnia  -we discussed possible serious and likely etiologies,  workup and treatment, treatment risks and return precautions -after this discussion, Wayne Booth opted for tx per pt instructions, appears GI office per notes left message for him to schedule colonoscopy - provided him number to call to schedule -follow up advised as needed -of course, we advised Wayne Booth  to return or notify a doctor immediately if symptoms worsen or persist or new concerns arise.  -Patient advised to return or notify a doctor immediately if symptoms worsen or persist or new concerns arise.  Patient Instructions  -no dairy for 5-7 days  -imodium for diarrhea and plenty of fluids, follow up if persists  -over the counter options for sleep: unisom, melatonin, valerian, benadryl - follow instuctions  -call the gastroenterology office about your colonoscopy     [name].

## 2013-09-26 NOTE — Patient Instructions (Signed)
-  no dairy for 5-7 days  -imodium for diarrhea and plenty of fluids, follow up if persists  -over the counter options for sleep: unisom, melatonin, valerian, benadryl - follow instuctions  -call the gastroenterology office about your colonoscopy

## 2013-09-26 NOTE — Progress Notes (Signed)
Pre visit review using our clinic review tool, if applicable. No additional management support is needed unless otherwise documented below in the visit note. 

## 2013-10-03 ENCOUNTER — Ambulatory Visit (AMBULATORY_SURGERY_CENTER): Payer: Self-pay | Admitting: *Deleted

## 2013-10-03 VITALS — Ht 71.0 in | Wt 291.6 lb

## 2013-10-03 DIAGNOSIS — Z86010 Personal history of colonic polyps: Secondary | ICD-10-CM

## 2013-10-03 MED ORDER — MOVIPREP 100 G PO SOLR: 1.0000 | Freq: Once | ORAL | Status: DC

## 2013-10-09 ENCOUNTER — Encounter: Payer: Self-pay | Admitting: Internal Medicine

## 2013-10-12 ENCOUNTER — Telehealth: Payer: Self-pay | Admitting: Pulmonary Disease

## 2013-10-12 DIAGNOSIS — G4733 Obstructive sleep apnea (adult) (pediatric): Secondary | ICD-10-CM

## 2013-10-12 NOTE — Telephone Encounter (Signed)
Called and spoke to pt regarding Bipap pressure. Pt was recently seen by [facility] on 09/08/2013 for OSA and switched from CPAP to BiPAP. Pt states he feels he is still "sucking in too much air", pt states into his stomach. Pt stated he is still "sucking in the air like he did with the CPAP." Pt stated he feels there is a small leak from his mask and thinks that may be contributing to the issue. The machine itself is working well. KC-please advise.

## 2013-10-13 NOTE — Telephone Encounter (Signed)
If he thinks there is too much pressure, we can decrease to 15/12. Make sure he has upcoming visit with me (should have apptm), Please send an order to DME for this.

## 2013-10-13 NOTE — Telephone Encounter (Signed)
Spoke with pt. He would like pressure decreased. I have placed order. He was told to f/u in 6 months from last OV 09/25/13. Does pt need to be seen sooner [facility]? thanks

## 2013-10-13 NOTE — Telephone Encounter (Signed)
That date is fine as long as he is doing well.

## 2013-10-16 ENCOUNTER — Encounter: Payer: Self-pay | Admitting: Internal Medicine

## 2013-10-16 ENCOUNTER — Ambulatory Visit (AMBULATORY_SURGERY_CENTER): Payer: Medicare Other | Admitting: Internal Medicine

## 2013-10-16 VITALS — BP 127/68 | HR 73 | Temp 99.2°F | Resp 16 | Ht 71.0 in | Wt 291.0 lb

## 2013-10-16 DIAGNOSIS — D126 Benign neoplasm of colon, unspecified: Secondary | ICD-10-CM

## 2013-10-16 DIAGNOSIS — Z1211 Encounter for screening for malignant neoplasm of colon: Secondary | ICD-10-CM

## 2013-10-16 DIAGNOSIS — Z86010 Personal history of colonic polyps: Secondary | ICD-10-CM

## 2013-10-16 MED ORDER — SODIUM CHLORIDE 0.9 % IV SOLN: 500.0000 mL | INTRAVENOUS | Status: DC

## 2013-10-16 NOTE — Patient Instructions (Signed)
YOU HAD AN ENDOSCOPIC PROCEDURE TODAY AT THE [facility]: Refer to the procedure report that was given to you for any specific questions about what was found during the examination.  If the procedure report does not answer your questions, please call your gastroenterologist to clarify.  If you requested that your care partner not be given the details of your procedure findings, then the procedure report has been included in a sealed envelope for you to review at your convenience later.  YOU SHOULD EXPECT: Some feelings of bloating in the abdomen. Passage of more gas than usual.  Walking can help get rid of the air that was put into your GI tract during the procedure and reduce the bloating. If you had a lower endoscopy (such as a colonoscopy or flexible sigmoidoscopy) you may notice spotting of blood in your stool or on the toilet paper. If you underwent a bowel prep for your procedure, then you may not have a normal bowel movement for a few days.  DIET: Your first meal following the procedure should be a light meal and then it is ok to progress to your normal diet.  A half-sandwich or bowl of soup is an example of a good first meal.  Heavy or fried foods are harder to digest and may make you feel nauseous or bloated.  Likewise meals heavy in dairy and vegetables can cause extra gas to form and this can also increase the bloating.  Drink plenty of fluids but you should avoid alcoholic beverages for 24 hours.  ACTIVITY: Your care partner should take you home directly after the procedure.  You should plan to take it easy, moving slowly for the rest of the day.  You can resume normal activity the day after the procedure however you should NOT DRIVE or use heavy machinery for 24 hours (because of the sedation medicines used during the test).    SYMPTOMS TO REPORT IMMEDIATELY: A gastroenterologist can be reached at any hour.  During normal business hours, 8:30 AM to 5:00 PM Monday through Friday,  call [phone].  After hours and on weekends, please call the GI answering service at [phone] who will take a message and have the physician on call contact you.   Following lower endoscopy (colonoscopy or flexible sigmoidoscopy):  Excessive amounts of blood in the stool  Significant tenderness or worsening of abdominal pains  Swelling of the abdomen that is new, acute  Fever of 100F or higher    FOLLOW UP: If any biopsies were taken you will be contacted by phone or by letter within the next 1-3 weeks.  Call your gastroenterologist if you have not heard about the biopsies in 3 weeks.  Our staff will call the home number listed on your records the next business day following your procedure to check on you and address any questions or concerns that you may have at that time regarding the information given to you following your procedure. This is a courtesy call and so if there is no answer at the home number and we have not heard from you through the emergency physician on call, we will assume that you have returned to your regular daily activities without incident.  SIGNATURES/CONFIDENTIALITY: You and/or your care partner have signed paperwork which will be entered into your electronic medical record.  These signatures attest to the fact that that the information above on your After Visit Summary has been reviewed and is understood.  Full responsibility of the confidentiality   of this discharge information lies with you and/or your care-partner.  Diverticulosis, high fiber diet and polyp information given.   Dr. Hilarie Fredrickson will advise you about recall for next colonscopy after pathology reports are reviewed.

## 2013-10-16 NOTE — Progress Notes (Signed)
Called to room to assist during endoscopic procedure.  Patient ID and intended procedure confirmed with present staff. Received instructions for my participation in the procedure from the performing physician.  

## 2013-10-16 NOTE — Op Note (Signed)
[place]  [address] [place], [id number]   COLONOSCOPY PROCEDURE REPORT  PATIENT: Wayne Booth, Wayne Booth  MR#: [id number] BIRTHDATE: [birth year] , 72  yrs. old GENDER: Male ENDOSCOPIST: Jerene Bears, MD PROCEDURE DATE:  10/16/2013 PROCEDURE:   Colonoscopy with snare polypectomy and Colonoscopy with cold biopsy polypectomy First Screening Colonoscopy - Avg.  risk and is 50 yrs.  old or older - No.  Prior Negative Screening - Now for repeat screening. N/A  History of Adenoma - Now for follow-up colonoscopy & has been > or = to 3 yrs.  Yes hx of adenoma.  Has been 3 or more years since last colonoscopy.  Polyps Removed Today? Yes. ASA CLASS:   Class III INDICATIONS:elevated risk screening and Patient's personal history of adenomatous colon polyps. MEDICATIONS: MAC sedation, administered by CRNA and Propofol (Diprivan) 230 mg IV  DESCRIPTION OF PROCEDURE:   After the risks benefits and alternatives of the procedure were thoroughly explained, informed consent was obtained.  A digital rectal exam revealed no rectal mass.   The LB [id number]  endoscope was introduced through the anus and advanced to the cecum, which was identified by both the appendix and ileocecal valve. No adverse events experienced. The quality of the prep was good, using MoviPrep  The instrument was then slowly withdrawn as the colon was fully examined.   COLON FINDINGS: Four sessile polyps ranging between 3-55mm in size were found in the ascending colon.  Polypectomy was performed using cold snare.  All resections were complete and all polyp tissue was completely retrieved.   Six sessile polyps measuring 3-6 mm in size were found at the hepatic flexure, in the transverse colon, descending colon, and sigmoid colon.  Polypectomy was performed with cold forceps and using cold snare.  All resections were complete and all polyp tissue was completely retrieved.   Mild diverticulosis was noted in  the sigmoid colon.  Retroflexed views revealed no abnormalities. The time to cecum=1 minutes 32 seconds. Withdrawal time=14 minutes 27 seconds.  The scope was withdrawn and the procedure completed. COMPLICATIONS: There were no complications.  ENDOSCOPIC IMPRESSION: 1.   Four sessile polyps ranging between 3-56mm in size were found in the ascending colon; Polypectomy was performed using cold snare 2.   Six sessile polyps measuring 3-6 mm in size were found at the hepatic flexure, in the transverse colon, descending colon, and sigmoid colon; Polypectomy was performed with cold forceps and using cold snare 3.   Mild diverticulosis was noted in the sigmoid colon  RECOMMENDATIONS: 1.  Await pathology results 2.  High fiber diet 3.  Timing of repeat colonoscopy will be determined by pathology findings. 4.  You will receive a letter within 1-2 weeks with the results of your biopsy as well as final recommendations.  Please call my office if you have not received a letter after 3 weeks.   eSigned:  Jerene Bears, MD 10/16/2013 9:22 AM   cc: The Patient and Lisabeth Pick, MD   PATIENT NAME:  Wayne Booth, Wayne Booth MR#: [id number]

## 2013-10-16 NOTE — Progress Notes (Signed)
Report to pacu rn, vss, bbs=clear 

## 2013-10-17 ENCOUNTER — Telehealth: Payer: Self-pay | Admitting: *Deleted

## 2013-10-17 NOTE — Telephone Encounter (Signed)
  Follow up Call-  Call back number 10/16/2013  Post procedure Call Back phone  # [phone]     Patient questions:  Message left to call us if necessary.

## 2013-10-25 ENCOUNTER — Other Ambulatory Visit: Payer: Self-pay | Admitting: Family

## 2013-10-30 ENCOUNTER — Encounter: Payer: Self-pay | Admitting: Internal Medicine

## 2013-11-07 ENCOUNTER — Other Ambulatory Visit: Payer: Self-pay | Admitting: Physician Assistant

## 2013-11-07 ENCOUNTER — Telehealth: Payer: Self-pay

## 2013-11-07 ENCOUNTER — Ambulatory Visit (INDEPENDENT_AMBULATORY_CARE_PROVIDER_SITE_OTHER): Payer: Medicare Other | Admitting: Physician Assistant

## 2013-11-07 ENCOUNTER — Encounter: Payer: Self-pay | Admitting: Physician Assistant

## 2013-11-07 VITALS — BP 122/68 | HR 87 | Temp 98.0°F | Resp 20 | Wt 294.0 lb

## 2013-11-07 DIAGNOSIS — F411 Generalized anxiety disorder: Secondary | ICD-10-CM

## 2013-11-07 MED ORDER — ESCITALOPRAM OXALATE 10 MG PO TABS: 10.0000 mg | ORAL_TABLET | Freq: Every day | ORAL | Status: DC

## 2013-11-07 MED ORDER — TRAZODONE HCL 50 MG PO TABS: 25.0000 mg | ORAL_TABLET | Freq: Every evening | ORAL | Status: DC | PRN

## 2013-11-07 MED ORDER — HYDROXYZINE HCL 50 MG PO TABS: 50.0000 mg | ORAL_TABLET | Freq: Every evening | ORAL | Status: DC | PRN

## 2013-11-07 MED ORDER — HYDROXYZINE HCL 25 MG PO TABS: 25.0000 mg | ORAL_TABLET | Freq: Every evening | ORAL | Status: DC | PRN

## 2013-11-07 NOTE — Telephone Encounter (Signed)
Message from Zenaida Niece, PA-C sent at 11/07/2013 11:10 AM ----- Call pt pharmacy and tell them to not fill the 2 hydroxyzine orders. We will switch to Trazodone 50mg , (half to 1 tab) at bedtime for sleep because insurance will cover this medication and not hydroxyzine.  [place] and spoke with Jeannene Patella  .  Discontinued both hydroxyzine prescriptions and told them to fill Trazodone.    Called and left a message for pt to return call about medication.

## 2013-11-07 NOTE — Telephone Encounter (Signed)
Called and spoke with pt and pt is aware.  

## 2013-11-07 NOTE — Patient Instructions (Signed)
You have been prescribed Lexapro 1 pill daily for Anxiety, which will replace your Paxil.  You have also been prescribed Hydroxyzine 1 pill at bedtime for insomnia which will replace your nortriptyline.  Please follow up in 2 weeks to reassess effectiveness of hydroxyzine and to address any new side effects from medication.   We will also plan another follow up at that time to assess the effectiveness of the Lexapro.  Follow up sooner as needed.   Generalized Anxiety Disorder Generalized anxiety disorder (GAD) is a mental disorder. It interferes with life functions, including relationships, work, and school. GAD is different from normal anxiety, which everyone experiences at some point in their lives in response to specific life events and activities. Normal anxiety actually helps [place] prepare for and get through these life events and activities. Normal anxiety goes away after the event or activity is over.  GAD causes anxiety that is not necessarily related to specific events or activities. It also causes excess anxiety in proportion to specific events or activities. The anxiety associated with GAD is also difficult to control. GAD can vary from mild to severe. People with severe GAD can have intense waves of anxiety with physical symptoms (panic attacks).  SYMPTOMS The anxiety and worry associated with GAD are difficult to control. This anxiety and worry are related to many life events and activities and also occur more days than not for 6 months or longer. People with GAD also have three or more of the following symptoms (one or more in children):  Restlessness.   Fatigue.  Difficulty concentrating.   Irritability.  Muscle tension.  Difficulty sleeping or unsatisfying sleep. DIAGNOSIS GAD is diagnosed through an assessment by your caregiver. Your caregiver will ask you questions aboutyour mood,physical symptoms, and events in your life. Your caregiver may ask you about your medical  history and use of alcohol or drugs, including prescription medications. Your caregiver may also do a physical exam and blood tests. Certain medical conditions and the use of certain substances can cause symptoms similar to those associated with GAD. Your caregiver may refer you to a mental health specialist for further evaluation. TREATMENT The following therapies are usually used to treat GAD:   Medication Antidepressant medication usually is prescribed for long-term daily control. Antianxiety medications may be added in severe cases, especially when panic attacks occur.   Talk therapy (psychotherapy) Certain types of talk therapy can be helpful in treating GAD by providing support, education, and guidance. A form of talk therapy called cognitive behavioral therapy can teach you healthy ways to think about and react to daily life events and activities.  Stress managementtechniques These include yoga, meditation, and exercise and can be very helpful when they are practiced regularly. A mental health specialist can help determine which treatment is best for you. Some people see improvement with one therapy. However, other people require a combination of therapies. Document Released: 10/10/2012 Document Reviewed: 10/10/2012 [facility] Patient Information 2014 [place], [place].

## 2013-11-07 NOTE — Progress Notes (Signed)
Subjective:    Patient ID: Wayne Booth, male    DOB: 1940-09-02, 73 y.o.   MRN: [id number]  HPI Patient is a 73 year old Caucasian male presenting to the clinic for anxiety medication management. Patient states that he has been on Paxil for his anxiety for approximately 10 years, and that he feels as though it is no longer effective. He is also prescribed nortriptyline for insomnia, however he states that he doesn't take it because it is also ineffective. Patient admits to feeling jumpy all the time, and anxious, and that his anxiety prevents him from sleeping, all despite taking medication. Patient also has a CPAP for OSA, and reports that this treatment is going well and doesn't interfere with sleep. Patient does not currently carry a diagnosis of anxiety. Patient believes that his anxiety originates from his history of cancer and medical problems, stating that he feels as though he could be diagnosed with a new chronic illness at any medical appointment. Patient admits to agitation, anxiety, and sleep disturbance. Patient denies suicidal ideation, plan for suicide or hurting himself or others, visual and auditory hallucinations, hyperactivity, decreased concentration, and confusion.   Review of Systems As per the history of present illness and otherwise negative.    Past Medical History  Diagnosis Date  . History of total knee replacement   . Hypertension   . Kidney stone   . Cancer   . Prostate cancer   . Kidney cancer, primary, with metastasis from kidney to other site   . Anxiety   . Pneumonia     hx of  . OSA (obstructive sleep apnea)     cpap at night  . Arthritis   . Pituitary cyst    Past Surgical History  Procedure Laterality Date  . Appendectomy    . Cholecystectomy    . Nephrectomy Left   . Insertion prostate radiation seed    . Replacement total knee bilateral    . Knee arthroscopy  2007    left  . Anterior cervical decomp/discectomy fusion N/A 08/29/2012   Procedure: ANTERIOR CERVICAL DECOMPRESSION/DISCECTOMY FUSION 1 LEVEL;  Surgeon: Wayne Moore, MD;  Location: [facility];  Service: Neurosurgery;  Laterality: N/A;  . Craniotomy N/A 11/08/2012    Procedure: CRANIOTOMY HYPOPHYSECTOMY TRANSNASAL APPROACH;  Surgeon: Wayne Ghee, MD;  Location: [facility];  Service: Neurosurgery;  Laterality: N/A;  Transphenoidal Resection of Pituitary Tumor    reports that he has been smoking Cigars.  He has never used smokeless tobacco. He reports that he drinks about 1.2 ounces of alcohol per week. He reports that he does not use illicit drugs. family history includes Cancer in his father and mother; Colon cancer in his brother; Kidney cancer in his sister. There is no history of Esophageal cancer, Rectal cancer, or Stomach cancer. No Known Allergies     Objective:   Physical Exam  Nursing note and vitals reviewed. Constitutional: He is oriented to person, place, and time. He appears well-developed and well-nourished. No distress.  HENT:  Head: Normocephalic and atraumatic.  Eyes: Conjunctivae and EOM are normal. Pupils are equal, round, and reactive to light.  Neck: Normal range of motion. Neck supple.  Cardiovascular: Normal rate, regular rhythm, normal heart sounds and intact distal pulses.  Exam reveals no gallop and no friction rub.   No murmur heard. Pulmonary/Chest: Effort normal and breath sounds normal. No respiratory distress. He has no wheezes. He has no rales. He exhibits no tenderness.  Musculoskeletal: Normal  range of motion.  Neurological: He is alert and oriented to person, place, and time.  Skin: Skin is warm and dry. No rash noted. He is not diaphoretic. No erythema. No pallor.  Psychiatric: He has a normal mood and affect. His behavior is normal. Judgment and thought content normal.  GAD-7 Anxiety Scale administered: Pt score 17 out of 21 points.   Filed Vitals:   11/07/13 0810  BP: 122/68  Pulse: 87  Temp: 98 F (36.7 C)    Resp: 20    Lab Results  Component Value Date   WBC 21.6* 11/08/2012   HGB 15.4 11/08/2012   HCT 44.0 11/08/2012   PLT 273 11/08/2012   GLUCOSE 181* 11/09/2012   CHOL 153 09/09/2007   TRIG 134 09/09/2007   HDL 35.8* 09/09/2007   LDLDIRECT 62.0 09/07/2006   LDLCALC 90 09/09/2007   ALT 41 11/01/2012   AST 37 11/01/2012   NA 138 11/09/2012   K 4.4 11/09/2012   CL 102 11/09/2012   CREATININE 1.18 11/09/2012   BUN 17 11/09/2012   CO2 25 11/09/2012   TSH 2.77 09/21/2012   PSA 0.76 09/09/2007   INR 0.95 08/26/2012   HGBA1C  Value: 5.7 (NOTE)   The ADA recommends the following therapeutic goal for glycemic   control related to Hgb A1C measurement:   Goal of Therapy:   < 7.0% Hgb A1C   Reference: American Diabetes Association: Clinical Practice   Recommendations 2008, Diabetes Care,  2008, 31:(Suppl 1). 08/31/2008      Assessment & Plan:  Wayne Booth was seen today for anxiety medication change.  Diagnoses and associated orders for this visit:  GAD (generalized anxiety disorder) - GAD-7 Impression: Pt likely suffers from severe GAD. Will alter current treatment to try an obtain more benefit and symptom control. - Patient was offered a psychiatry referral, but declined. Pt wishes to try symptom management with PCP first. - escitalopram (LEXAPRO) 10 MG tablet; Take 1 tablet (10 mg total) by mouth daily. - hydrOXYzine (ATARAX/VISTARIL) 50 MG tablet; Take 1 tablet (50 mg total) by mouth at bedtime as needed.  Plan to follow up in 2 weeks to reassess sleep symptoms. Plan to follow up in 4 to 6 weeks to reassess anxiety symptoms.  Patient Instructions  You have been prescribed Lexapro 1 pill daily for Anxiety, which will replace your Paxil.  You have also been prescribed Hydroxyzine 1 pill at bedtime for insomnia which will replace your nortriptyline.  Please follow up in 2 weeks to reassess effectiveness of hydroxyzine and to address any new side effects from medication.   We will also plan another follow up  at that time to assess the effectiveness of the Lexapro.  Follow up sooner as needed.

## 2013-11-22 ENCOUNTER — Encounter: Payer: Self-pay | Admitting: Physician Assistant

## 2013-11-22 ENCOUNTER — Ambulatory Visit (INDEPENDENT_AMBULATORY_CARE_PROVIDER_SITE_OTHER): Payer: Medicare Other | Admitting: Physician Assistant

## 2013-11-22 VITALS — BP 110/72 | HR 88 | Temp 97.7°F | Resp 18 | Wt 292.0 lb

## 2013-11-22 DIAGNOSIS — F411 Generalized anxiety disorder: Secondary | ICD-10-CM

## 2013-11-22 DIAGNOSIS — G4700 Insomnia, unspecified: Secondary | ICD-10-CM

## 2013-11-22 DIAGNOSIS — Z09 Encounter for follow-up examination after completed treatment for conditions other than malignant neoplasm: Secondary | ICD-10-CM

## 2013-11-22 NOTE — Progress Notes (Signed)
Pre visit review using our clinic review tool, if applicable. No additional management support is needed unless otherwise documented below in the visit note. 

## 2013-11-22 NOTE — Patient Instructions (Addendum)
Continue both the Lexapro and trazodone as prescribed.  We will followup in 4 weeks to reassess the effectiveness of the Lexapro for your anxiety symptoms.  Followup as needed, or symptoms fail to improve or worsen despite treatment.  Generalized Anxiety Disorder Generalized anxiety disorder (GAD) is a mental disorder. It interferes with life functions, including relationships, work, and school. GAD is different from normal anxiety, which everyone experiences at some point in their lives in response to specific life events and activities. Normal anxiety actually helps [place] prepare for and get through these life events and activities. Normal anxiety goes away after the event or activity is over.  GAD causes anxiety that is not necessarily related to specific events or activities. It also causes excess anxiety in proportion to specific events or activities. The anxiety associated with GAD is also difficult to control. GAD can vary from mild to severe. People with severe GAD can have intense waves of anxiety with physical symptoms (panic attacks).  SYMPTOMS The anxiety and worry associated with GAD are difficult to control. This anxiety and worry are related to many life events and activities and also occur more days than not for 6 months or longer. People with GAD also have three or more of the following symptoms (one or more in children):  Restlessness.   Fatigue.  Difficulty concentrating.   Irritability.  Muscle tension.  Difficulty sleeping or unsatisfying sleep. DIAGNOSIS GAD is diagnosed through an assessment by your caregiver. Your caregiver will ask you questions aboutyour mood,physical symptoms, and events in your life. Your caregiver may ask you about your medical history and use of alcohol or drugs, including prescription medications. Your caregiver may also do a physical exam and blood tests. Certain medical conditions and the use of certain substances can cause symptoms similar  to those associated with GAD. Your caregiver may refer you to a mental health specialist for further evaluation. TREATMENT The following therapies are usually used to treat GAD:   Medication Antidepressant medication usually is prescribed for long-term daily control. Antianxiety medications may be added in severe cases, especially when panic attacks occur.   Talk therapy (psychotherapy) Certain types of talk therapy can be helpful in treating GAD by providing support, education, and guidance. A form of talk therapy called cognitive behavioral therapy can teach you healthy ways to think about and react to daily life events and activities.  Stress managementtechniques These include yoga, meditation, and exercise and can be very helpful when they are practiced regularly. A mental health specialist can help determine which treatment is best for you. Some people see improvement with one therapy. However, other people require a combination of therapies. Document Released: 10/10/2012 Document Reviewed: 10/10/2012 [facility] Patient Information 2014 [place], [place].

## 2013-11-22 NOTE — Progress Notes (Signed)
Subjective:    Patient ID: Wayne Booth, male    DOB: [birth year], 73 y.o.   MRN: [id number]  HPI Patient is a 73 year old Caucasian male presenting to the clinic for followup on medication for anxiety with sleep disturbances. The patient was seen 2 weeks ago and a GAD 7 was administered revealing a likely generalized anxiety disorder with sleep disturbances. The patient had previously been prescribed Paxil and nortriptyline, however he felt that both these medications were no longer working. He was prescribed Lexapro for anxiety for which he will followup in a month to reassess. He was prescribed trazodone for his difficulty sleeping for which we are assessing the effectiveness of today. Patient is tolerating all medications well. Patient states that he has been getting very good sleep, and is able to stay asleep through the night on 1 trazodone at night. He takes it 30 minutes before sleeping. He denies any adverse effects. The patient states that the Lexapro is working better than the Paxil did for his anxiety, however he is still occasionally "antsy or snappy". He denies any adverse effects. He has no symptoms at this time, denies fevers, chills, nausea, vomiting, diarrhea, and shortness of breath.   Review of Systems As per the history of present illness and otherwise negative.  Past Medical History  Diagnosis Date  . History of total knee replacement   . Hypertension   . Kidney stone   . Cancer   . Prostate cancer   . Kidney cancer, primary, with metastasis from kidney to other site   . Anxiety   . Pneumonia     hx of  . OSA (obstructive sleep apnea)     cpap at night  . Arthritis   . Pituitary cyst    Past Surgical History  Procedure Laterality Date  . Appendectomy    . Cholecystectomy    . Nephrectomy Left   . Insertion prostate radiation seed    . Replacement total knee bilateral    . Knee arthroscopy  2007    left  . Anterior cervical decomp/discectomy fusion N/A  08/29/2012    Procedure: ANTERIOR CERVICAL DECOMPRESSION/DISCECTOMY FUSION 1 LEVEL;  Surgeon: Eustace Moore, MD;  Location: [facility];  Service: Neurosurgery;  Laterality: N/A;  . Craniotomy N/A 11/08/2012    Procedure: CRANIOTOMY HYPOPHYSECTOMY TRANSNASAL APPROACH;  Surgeon: Faythe Ghee, MD;  Location: [place];  Service: Neurosurgery;  Laterality: N/A;  Transphenoidal Resection of Pituitary Tumor    reports that he has been smoking Cigars.  He has never used smokeless tobacco. He reports that he drinks about 1.2 ounces of alcohol per week. He reports that he does not use illicit drugs. family history includes Cancer in his father and mother; Colon cancer in his brother; Kidney cancer in his sister. There is no history of Esophageal cancer, Rectal cancer, or Stomach cancer. No Known Allergies     Objective:   Physical Exam  Nursing note and vitals reviewed. Constitutional: He is oriented to person, place, and time. He appears well-developed and well-nourished. No distress.  HENT:  Head: Normocephalic and atraumatic.  Eyes: Conjunctivae and EOM are normal. Pupils are equal, round, and reactive to light.  Neck: Normal range of motion. Neck supple.  Cardiovascular: Normal rate, regular rhythm, normal heart sounds and intact distal pulses.  Exam reveals no gallop and no friction rub.   No murmur heard. Pulmonary/Chest: Effort normal and breath sounds normal. No respiratory distress. He has no wheezes. He has  no rales. He exhibits no tenderness.  Musculoskeletal: Normal range of motion.  Neurological: He is alert and oriented to person, place, and time.  Skin: Skin is warm and dry. No rash noted. He is not diaphoretic. No erythema. No pallor.  Psychiatric: He has a normal mood and affect. His behavior is normal. Judgment and thought content normal.   Filed Vitals:   11/22/13 0801  BP: 110/72  Pulse: 88  Temp: 97.7 F (36.5 C)  Resp: 18   Lab Results  Component Value Date   WBC  21.6* 11/08/2012   HGB 15.4 11/08/2012   HCT 44.0 11/08/2012   PLT 273 11/08/2012   GLUCOSE 181* 11/09/2012   CHOL 153 09/09/2007   TRIG 134 09/09/2007   HDL 35.8* 09/09/2007   LDLDIRECT 62.0 09/07/2006   LDLCALC 90 09/09/2007   ALT 41 11/01/2012   AST 37 11/01/2012   NA 138 11/09/2012   K 4.4 11/09/2012   CL 102 11/09/2012   CREATININE 1.18 11/09/2012   BUN 17 11/09/2012   CO2 25 11/09/2012   TSH 2.77 09/21/2012   PSA 0.76 09/09/2007   INR 0.95 08/26/2012   HGBA1C  Value: 5.7 (NOTE)   The ADA recommends the following therapeutic goal for glycemic   control related to Hgb A1C measurement:   Goal of Therapy:   < 7.0% Hgb A1C   Reference: American Diabetes Association: Clinical Practice   Recommendations 2008, Diabetes Care,  2008, 31:(Suppl 1). 08/31/2008       Assessment & Plan:  Wayne Booth was seen today for follow-up.  Diagnoses and associated orders for this visit:  GAD (generalized anxiety disorder) Comments: Improved on Lexapro, still a little anxious. Will allow more time for medication to work before altering.  Follow up Comments: Tolerating medications well. No adverse effects.  Insomnia Comments: Greatly improved on trazodone. Continue treatment.    Explained to patient that it can take 4-6 weeks for Lexapro to be completely effective. We will followup in 4 weeks to reassess his anxiety symptoms.   Plan to followup in 4 weeks.   Patient Instructions  Continue both the Lexapro and trazodone as prescribed.  We will followup in 4 weeks to reassess the effectiveness of the Lexapro for your anxiety symptoms.  Followup as needed, or symptoms fail to improve or worsen despite treatment.

## 2013-11-24 ENCOUNTER — Telehealth: Payer: Self-pay | Admitting: Internal Medicine

## 2013-11-24 NOTE — Telephone Encounter (Signed)
Pt states he was given an appointment with matt but does not remember what it was.  I do not see an appointment in EPIC.  Please call pt back with appt date and time.

## 2013-11-25 NOTE — Telephone Encounter (Signed)
The appointment was suppose to be 12/22/13 at 8 am.  Put patient back on schedule for this time.

## 2013-11-27 NOTE — Telephone Encounter (Signed)
Left a message for return call.  

## 2013-11-27 NOTE — Telephone Encounter (Signed)
Pt is aware of appointment 

## 2013-12-19 ENCOUNTER — Telehealth: Payer: Self-pay | Admitting: Internal Medicine

## 2013-12-19 DIAGNOSIS — F411 Generalized anxiety disorder: Secondary | ICD-10-CM

## 2013-12-19 MED ORDER — ESCITALOPRAM OXALATE 10 MG PO TABS: 10.0000 mg | ORAL_TABLET | Freq: Every day | ORAL | Status: DC

## 2013-12-19 MED ORDER — TRAZODONE HCL 50 MG PO TABS: 25.0000 mg | ORAL_TABLET | Freq: Every evening | ORAL | Status: DC | PRN

## 2013-12-19 NOTE — Telephone Encounter (Signed)
Rx sent to pharmacy   

## 2013-12-19 NOTE — Telephone Encounter (Signed)
Pt states he is doing great on the med escitalopram (LEXAPRO) 10 MG tablet And traZODone (DESYREL) 50 MG tablet Pt saw matt for these meds Would like to know if he could get a 90 day this time sent to [facility]

## 2013-12-22 ENCOUNTER — Ambulatory Visit: Payer: Medicare Other | Admitting: Physician Assistant

## 2014-01-03 ENCOUNTER — Other Ambulatory Visit: Payer: Self-pay | Admitting: Physician Assistant

## 2014-02-02 ENCOUNTER — Other Ambulatory Visit: Payer: Self-pay | Admitting: Physician Assistant

## 2014-02-28 ENCOUNTER — Other Ambulatory Visit: Payer: Self-pay | Admitting: Internal Medicine

## 2014-03-06 ENCOUNTER — Ambulatory Visit: Payer: Medicare Other | Admitting: Pulmonary Disease

## 2014-03-28 ENCOUNTER — Other Ambulatory Visit: Payer: Self-pay | Admitting: Internal Medicine

## 2014-04-03 ENCOUNTER — Ambulatory Visit (INDEPENDENT_AMBULATORY_CARE_PROVIDER_SITE_OTHER): Payer: Medicare Other

## 2014-04-03 DIAGNOSIS — Z23 Encounter for immunization: Secondary | ICD-10-CM

## 2014-04-24 ENCOUNTER — Ambulatory Visit: Payer: Medicare Other | Admitting: Pulmonary Disease

## 2014-05-10 ENCOUNTER — Telehealth: Payer: Self-pay | Admitting: Internal Medicine

## 2014-05-10 DIAGNOSIS — F411 Generalized anxiety disorder: Secondary | ICD-10-CM

## 2014-05-10 MED ORDER — ESCITALOPRAM OXALATE 10 MG PO TABS: 10.0000 mg | ORAL_TABLET | Freq: Every day | ORAL | Status: DC

## 2014-05-10 NOTE — Telephone Encounter (Signed)
rx sent in electronically 

## 2014-05-10 NOTE — Telephone Encounter (Signed)
Wayne Booth, [place] is requesting re-fill on escitalopram (LEXAPRO) 10 MG tablet

## 2014-05-28 ENCOUNTER — Other Ambulatory Visit: Payer: Self-pay | Admitting: Ophthalmology

## 2014-05-29 ENCOUNTER — Other Ambulatory Visit: Payer: Self-pay | Admitting: Internal Medicine

## 2014-07-05 ENCOUNTER — Ambulatory Visit (INDEPENDENT_AMBULATORY_CARE_PROVIDER_SITE_OTHER): Payer: Medicare Other

## 2014-07-05 ENCOUNTER — Ambulatory Visit (INDEPENDENT_AMBULATORY_CARE_PROVIDER_SITE_OTHER): Payer: Medicare Other | Admitting: Podiatry

## 2014-07-05 ENCOUNTER — Encounter: Payer: Self-pay | Admitting: Podiatry

## 2014-07-05 VITALS — BP 125/78 | HR 74 | Resp 16

## 2014-07-05 DIAGNOSIS — M722 Plantar fascial fibromatosis: Secondary | ICD-10-CM | POA: Diagnosis not present

## 2014-07-05 DIAGNOSIS — R52 Pain, unspecified: Secondary | ICD-10-CM | POA: Diagnosis not present

## 2014-07-05 NOTE — Progress Notes (Signed)
   Subjective:    Patient ID: Wayne Booth, male    DOB: [birth year], 74 y.o.   MRN: [id number]  HPI Pt presents with bilateral arch pain, ongoing, chronic   Review of Systems  All other systems reviewed and are negative.      Objective:   Physical Exam        Assessment & Plan:

## 2014-07-06 DIAGNOSIS — H40053 Ocular hypertension, bilateral: Secondary | ICD-10-CM | POA: Diagnosis not present

## 2014-07-06 NOTE — Progress Notes (Signed)
Subjective:     Patient ID: Wayne Booth, male   DOB: Dec 04, 1940, 74 y.o.   MRN: [id number]  HPI patient presents stating that he's been having ongoing pain in his arches of both feet and it makes ambulation difficult. He is obese which is a complicating factor to this condition 8 it's been going for about a year   Review of Systems  All other systems reviewed and are negative.      Objective:   Physical Exam  Constitutional: He is oriented to person, place, and time.  Cardiovascular: Intact distal pulses.   Musculoskeletal: Normal range of motion.  Neurological: He is oriented to person, place, and time.  Skin: Skin is warm and dry.  Nursing note and vitals reviewed.  neurovascular status is found to be intact with moderate varicosities in the ankle and equinus condition bilateral. Patient's noted to have diminished range of motion subtalar midtarsal joint and does have discomfort to palpation of the arch of both feet with inflammation and fluid in the mid arch region. 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 Today I went ahead and I recommended supportive shoes stretching exercises and I dispensed a night splint which we will use on each foot for 30 minute periods twice a day. Also encouraged ice therapy and reappoint again in the next several months and may ultimately require orthotics depending on response

## 2014-07-09 ENCOUNTER — Ambulatory Visit: Payer: Self-pay | Admitting: Podiatry

## 2014-07-12 ENCOUNTER — Ambulatory Visit (INDEPENDENT_AMBULATORY_CARE_PROVIDER_SITE_OTHER): Payer: Medicare Other | Admitting: Family Medicine

## 2014-07-12 ENCOUNTER — Encounter: Payer: Self-pay | Admitting: Family Medicine

## 2014-07-12 VITALS — BP 110/64 | Temp 97.6°F | Wt 281.0 lb

## 2014-07-12 DIAGNOSIS — I10 Essential (primary) hypertension: Secondary | ICD-10-CM | POA: Diagnosis not present

## 2014-07-12 DIAGNOSIS — F411 Generalized anxiety disorder: Secondary | ICD-10-CM

## 2014-07-12 DIAGNOSIS — R739 Hyperglycemia, unspecified: Secondary | ICD-10-CM | POA: Diagnosis not present

## 2014-07-12 DIAGNOSIS — E039 Hypothyroidism, unspecified: Secondary | ICD-10-CM | POA: Insufficient documentation

## 2014-07-12 MED ORDER — ESCITALOPRAM OXALATE 20 MG PO TABS: 20.0000 mg | ORAL_TABLET | Freq: Every day | ORAL | Status: DC

## 2014-07-12 MED ORDER — LOSARTAN POTASSIUM 100 MG PO TABS: 100.0000 mg | ORAL_TABLET | Freq: Every day | ORAL | Status: DC

## 2014-07-12 NOTE — Assessment & Plan Note (Signed)
Controlled on Losartan 100mg , hctz 25.

## 2014-07-12 NOTE — Assessment & Plan Note (Signed)
Mild poor control on lexapro 20mg  though GAD7 only 5, increase lexapro to 20mg . Follow up prn as symptoms not severe.

## 2014-07-12 NOTE — Progress Notes (Signed)
Wayne Reddish, MD Phone: [phone]  Subjective:  Patient presents today to establish care with me as their new primary care provider. Patient was formerly a patient of Dr. Leanne Chang. Chief complaint-noted.   Generalized Anxiety Disorder with mild poor control -gad7 score of 5. Main thing seems to be some irritability and some mild anxiety. He would like to know if medication could be adjusted to help him.   ROS- No SI/HI  Hypertension-controlled, needs refill  BP Readings from Last 3 Encounters:  07/12/14 110/64  07/05/14 125/78  11/22/13 110/72  Home BP monitoring-no Plans to join the [employer] with medications-yes without side effects ROS-Denies any CP, HA, SOB, blurry vision   The following were reviewed and entered/updated in epic: Past Medical History  Diagnosis Date  . History of total knee replacement   . Hypertension   . Kidney stone   . Cancer   . Prostate cancer   . Kidney cancer, primary, with metastasis from kidney to other site   . Anxiety   . Pneumonia     hx of  . OSA (obstructive sleep apnea)     cpap at night  . Arthritis   . Pituitary cyst    Patient Active Problem List   Diagnosis Date Noted  . 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 Renal cell carcinoma 12/26/2007    Priority: High  . Hypothyroidism 07/12/2014    Priority: Medium  . Hyperglycemia 07/12/2014    Priority: Medium  . GAD (generalized anxiety disorder) 11/07/2013    Priority: Medium  . OSA (obstructive sleep apnea) 07/07/2011    Priority: Medium  . Morbid obesity 10/02/2008    Priority: Medium  . Essential hypertension 12/28/2006    Priority: Medium  . Bronchospasm 06/09/2011    Priority: Low  . Actinic keratosis 11/27/2009    Priority: Low  . Osteoarthritis 11/13/2008    Priority: Low  . TESTOSTERONE DEFICIENCY 12/28/2006    Priority: Low   Past Surgical History  Procedure Laterality Date  . Appendectomy    . Cholecystectomy    .  Nephrectomy Left   . Insertion prostate radiation seed    . Replacement total knee bilateral    . Knee arthroscopy  2007    left  . Anterior cervical decomp/discectomy fusion N/A 08/29/2012    Procedure: ANTERIOR CERVICAL DECOMPRESSION/DISCECTOMY FUSION 1 LEVEL;  Surgeon: Eustace Moore, MD;  Location: [place] [facility];  Service: Neurosurgery;  Laterality: N/A;  . Craniotomy N/A 11/08/2012    Procedure: CRANIOTOMY HYPOPHYSECTOMY TRANSNASAL APPROACH;  Surgeon: Faythe Ghee, MD;  Location: [place] [facility];  Service: Neurosurgery;  Laterality: N/A;  Transphenoidal Resection of Pituitary Tumor    Family History  Problem Relation Age of Onset  . Cancer Father     ?mouth, died before patient born  . Cancer Mother     stomach, died when he was young  . Kidney cancer Sister   . Colon cancer Brother   . Esophageal cancer Neg Hx   . Rectal cancer Neg Hx   . Stomach cancer Neg Hx     Medications- reviewed and updated Current Outpatient Prescriptions  Medication Sig Dispense Refill  . aspirin EC 81 MG tablet Take 81 mg by mouth daily.    Marland Kitchen escitalopram (LEXAPRO) 10 MG tablet Take 1 tablet (10 mg total) by mouth daily. 90 tablet 0  . hydrochlorothiazide (HYDRODIURIL) 25 MG tablet TAKE ONE TABLET BY MOUTH EVERY MORNING... NEEDS FOLLOW UP APPOINTMENT 90 tablet  1  . levothyroxine (SYNTHROID, LEVOTHROID) 100 MCG tablet     . losartan (COZAAR) 100 MG tablet TAKE ONE TABLET BY MOUTH DAILY 90 tablet 0  . Multiple Vitamins-Minerals (MULTIVITAMINS THER. W/MINERALS) TABS Take 1 tablet by mouth daily.    . traZODone (DESYREL) 50 MG tablet Take 0.5-1 tablets (25-50 mg total) by mouth at bedtime as needed for sleep. 90 tablet 0   Allergies-reviewed and updated No Known Allergies  History   Social History  . Marital Status: Married    Spouse Name: [name]    Number of Children: Y  . Years of Education: N/A   Occupational History  . retired Buyer, retail    Social History Main Topics  .  Smoking status: Current Some Day Smoker -- 4 years    Types: Cigars  . Smokeless tobacco: Never Used     Comment: 1 cigar per month, with golf  . Alcohol Use: 1.2 oz/week    2 Shots of liquor per week     Comment: socially  . Drug Use: No  . Sexual Activity: Not on file   Other Topics Concern  . Not on file   Social History Narrative   Married 1965 (wife patient of Dr. Megan Salon). 2 sons. 4 granddaughters.       Retired Chemical engineer.       Hobbies: golf, fishing, formerly hunting.     ROS--See HPI   Objective: BP 110/64 mmHg  Temp(Src) 97.6 F (36.4 C)  Wt 281 lb (127.461 kg) Gen: NAD, resting comfortably Neck: no thyromegaly CV: RRR no murmurs rubs or gallops Lungs: CTAB no crackles, wheeze, rhonchi Abdomen: soft/nontender/nondistended/normal bowel sounds. No rebound or guarding. Obese.  Ext: no edema Skin: warm, dry Neuro: grossly normal, moves all extremities, PERRLA   Assessment/Plan:  GAD (generalized anxiety disorder) Mild poor control on lexapro 20mg  though GAD7 only 5, increase lexapro to 20mg . Follow up prn as symptoms not severe.    Essential hypertension Controlled on Losartan 100mg , hctz 25.    Return precautions advised.   Future fasting labs before physical. TSH through endocrine. PSA through urology.  Orders Placed This Encounter  Procedures  . CBC    Ages    Standing Status: Future     Number of Occurrences:      Standing Expiration Date: 07/13/2015  . Comprehensive metabolic panel    [place]    Standing Status: Future     Number of Occurrences:      Standing Expiration Date: 07/13/2015    Order Specific Question:  Has the patient fasted?    Answer:  No  . Lipid panel    [place]    Standing Status: Future     Number of Occurrences:      Standing Expiration Date: 07/13/2015    Order Specific Question:  Has the patient fasted?    Answer:  No  . Hemoglobin A1c    [place]    Standing Status: Future     Number of  Occurrences:      Standing Expiration Date: 07/13/2015    Meds ordered this encounter  Medications  . escitalopram (LEXAPRO) 20 MG tablet    Sig: Take 1 tablet (20 mg total) by mouth daily.    Dispense:  90 tablet    Refill:  3  . losartan (COZAAR) 100 MG tablet    Sig: Take 1 tablet (100 mg total) by mouth daily.    Dispense:  90 tablet    Refill:  3    

## 2014-07-12 NOTE — Patient Instructions (Signed)
Blood pressure looks great. No changes to medicine.   For irritability and anxiety, increase lexapro to 20mg  (take 2 tabs until you run out) and then get new bottle.   See me for a physical at my next available. Come in for bloodwork a few days before and make sure to come fasting.

## 2014-07-19 DIAGNOSIS — M25552 Pain in left hip: Secondary | ICD-10-CM | POA: Diagnosis not present

## 2014-08-07 ENCOUNTER — Other Ambulatory Visit: Payer: Self-pay | Admitting: Internal Medicine

## 2014-08-12 ENCOUNTER — Other Ambulatory Visit: Payer: Self-pay | Admitting: Internal Medicine

## 2014-08-13 ENCOUNTER — Other Ambulatory Visit: Payer: Self-pay

## 2014-08-13 DIAGNOSIS — F411 Generalized anxiety disorder: Secondary | ICD-10-CM

## 2014-08-13 MED ORDER — TRAZODONE HCL 50 MG PO TABS: 25.0000 mg | ORAL_TABLET | Freq: Every evening | ORAL | Status: DC | PRN

## 2014-08-17 ENCOUNTER — Ambulatory Visit: Payer: Medicare Other | Admitting: Podiatrist

## 2014-08-27 DIAGNOSIS — E039 Hypothyroidism, unspecified: Secondary | ICD-10-CM | POA: Diagnosis not present

## 2014-08-31 ENCOUNTER — Other Ambulatory Visit: Payer: Self-pay | Admitting: Internal Medicine

## 2014-09-03 DIAGNOSIS — Z6839 Body mass index (BMI) 39.0-39.9, adult: Secondary | ICD-10-CM | POA: Diagnosis not present

## 2014-09-03 DIAGNOSIS — E039 Hypothyroidism, unspecified: Secondary | ICD-10-CM | POA: Diagnosis not present

## 2014-09-03 DIAGNOSIS — D352 Benign neoplasm of pituitary gland: Secondary | ICD-10-CM | POA: Diagnosis not present

## 2014-10-31 ENCOUNTER — Ambulatory Visit (INDEPENDENT_AMBULATORY_CARE_PROVIDER_SITE_OTHER): Payer: Medicare Other | Admitting: Family Medicine

## 2014-10-31 ENCOUNTER — Encounter: Payer: Self-pay | Admitting: Family Medicine

## 2014-10-31 ENCOUNTER — Telehealth: Payer: Self-pay | Admitting: Family Medicine

## 2014-10-31 ENCOUNTER — Ambulatory Visit (INDEPENDENT_AMBULATORY_CARE_PROVIDER_SITE_OTHER)
Admission: RE | Admit: 2014-10-31 | Discharge: 2014-10-31 | Disposition: A | Discharge status: Home / Self Care | Payer: Medicare Other | Source: Ambulatory Visit | Attending: Family Medicine | Admitting: Family Medicine

## 2014-10-31 VITALS — BP 125/72 | HR 103 | Temp 100.9°F | Ht 71.0 in | Wt 266.0 lb

## 2014-10-31 DIAGNOSIS — R05 Cough: Secondary | ICD-10-CM | POA: Diagnosis not present

## 2014-10-31 DIAGNOSIS — J209 Acute bronchitis, unspecified: Secondary | ICD-10-CM

## 2014-10-31 DIAGNOSIS — R0602 Shortness of breath: Secondary | ICD-10-CM | POA: Diagnosis not present

## 2014-10-31 MED ORDER — ALBUTEROL SULFATE HFA 108 (90 BASE) MCG/ACT IN AERS
2.0000 | INHALATION_SPRAY | RESPIRATORY_TRACT | Status: DC | PRN
Start: 2014-10-31 — End: 2015-11-13

## 2014-10-31 MED ORDER — METHYLPREDNISOLONE 4 MG PO TBPK: ORAL_TABLET | ORAL | Status: DC

## 2014-10-31 MED ORDER — CLARITHROMYCIN 500 MG PO TABS: 500.0000 mg | ORAL_TABLET | Freq: Two times a day (BID) | ORAL | Status: DC

## 2014-10-31 MED ORDER — METHYLPREDNISOLONE ACETATE 80 MG/ML IJ SUSP
120.0000 mg | Freq: Once | INTRAMUSCULAR | Status: AC
  Administered 2014-10-31: 120 mg via INTRAMUSCULAR

## 2014-10-31 MED ORDER — HYDROCODONE-HOMATROPINE 5-1.5 MG/5ML PO SYRP: 5.0000 mL | ORAL_SOLUTION | ORAL | Status: DC | PRN

## 2014-10-31 NOTE — Progress Notes (Signed)
Pre visit review using our clinic review tool, if applicable. No additional management support is needed unless otherwise documented below in the visit note. 

## 2014-10-31 NOTE — Addendum Note (Signed)
Addended by: Santiago Bumpers on: 10/31/2014 10:18 AM   Modules accepted: Orders

## 2014-10-31 NOTE — Progress Notes (Signed)
   Subjective:    Patient ID: Wayne Booth, male    DOB: 1940-07-17, 74 y.o.   MRN: [id number]  HPI Here for 3 days of fever, chest tightness, coughing up green sputum, and wheezing. He feels very SOB but denies chest pain. He has some nausea but has not vomited. Using Mucinex.    Review of Systems  Constitutional: Positive for fever and fatigue.  HENT: Positive for congestion and postnasal drip. Negative for sinus pressure.   Eyes: Negative.   Respiratory: Positive for cough, chest tightness, shortness of breath and wheezing.   Cardiovascular: Negative.        Objective:   Physical Exam  Constitutional: He appears well-developed and well-nourished.  Gets SOB on walking down the hall   HENT:  Right Ear: External ear normal.  Left Ear: External ear normal.  Nose: Nose normal.  Mouth/Throat: Oropharynx is clear and moist.  Eyes: Conjunctivae are normal.  Cardiovascular: Normal rate, regular rhythm, normal heart sounds and intact distal pulses.   Pulmonary/Chest: He has no rales.  Loud rhonchi and wheezes diffusely   Lymphadenopathy:    He has no cervical adenopathy.          Assessment & Plan:  Bronchitis with acute bronchspasms. Given a nebulizer treatment today with immediate improvement. Given a steroid shot to be followed ny a taper pack. Given Biaxin and an inhaler. He will get a CXR today.

## 2014-10-31 NOTE — Telephone Encounter (Signed)
I spoke with pt and gave results.  

## 2014-10-31 NOTE — Telephone Encounter (Signed)
Pt needs xray results. Pt saw dr fry today

## 2014-11-05 ENCOUNTER — Telehealth: Payer: Self-pay | Admitting: Family Medicine

## 2014-11-05 ENCOUNTER — Other Ambulatory Visit (INDEPENDENT_AMBULATORY_CARE_PROVIDER_SITE_OTHER): Payer: Medicare Other

## 2014-11-05 DIAGNOSIS — I10 Essential (primary) hypertension: Secondary | ICD-10-CM

## 2014-11-05 DIAGNOSIS — R739 Hyperglycemia, unspecified: Secondary | ICD-10-CM

## 2014-11-05 LAB — HEMOGLOBIN A1C: Hgb A1c MFr Bld: 5.7 % (ref 4.6–6.5)

## 2014-11-05 LAB — COMPREHENSIVE METABOLIC PANEL
ALBUMIN: 3.4 g/dL — AB (ref 3.5–5.2)
ALT: 24 U/L (ref 0–53)
AST: 16 U/L (ref 0–37)
Alkaline Phosphatase: 63 U/L (ref 39–117)
BILIRUBIN TOTAL: 0.4 mg/dL (ref 0.2–1.2)
BUN: 31 mg/dL — ABNORMAL HIGH (ref 6–23)
CALCIUM: 9.1 mg/dL (ref 8.4–10.5)
CO2: 31 meq/L (ref 19–32)
CREATININE: 1.26 mg/dL (ref 0.40–1.50)
Chloride: 99 mEq/L (ref 96–112)
GFR: 59.73 mL/min — AB (ref >60.00)
GLUCOSE: 104 mg/dL — AB (ref 70–99)
Potassium: 4.3 mEq/L (ref 3.5–5.1)
SODIUM: 135 meq/L (ref 135–145)
Total Protein: 6.9 g/dL (ref 6.0–8.3)

## 2014-11-05 LAB — LIPID PANEL
Cholesterol: 120 mg/dL (ref 0–200)
HDL: 46.1 mg/dL (ref >39.00)
LDL CALC: 50 mg/dL (ref 0–99)
NonHDL: 73.9
Total CHOL/HDL Ratio: 3
Triglycerides: 119 mg/dL (ref 0.0–149.0)
VLDL: 23.8 mg/dL (ref 0.0–40.0)

## 2014-11-05 LAB — CBC
HCT: 48.9 % (ref 39.0–52.0)
Hemoglobin: 16.7 g/dL (ref 13.0–17.0)
MCHC: 34.1 g/dL (ref 30.0–36.0)
MCV: 92.3 fl (ref 78.0–100.0)
Platelets: 351 10*3/uL (ref 150.0–400.0)
RBC: 5.3 Mil/uL (ref 4.22–5.81)
RDW: 14.3 % (ref 11.5–15.5)
WBC: 15.1 10*3/uL — ABNORMAL HIGH (ref 4.0–10.5)

## 2014-11-05 NOTE — Telephone Encounter (Signed)
See below

## 2014-11-05 NOTE — Telephone Encounter (Signed)
Pt notified and states he will follow up with [place] at his OV on 11/12/14.

## 2014-11-05 NOTE — Telephone Encounter (Signed)
Dr. Sarajane Jews gave him pretty much everything we have available for bronchitis. Unfortunately even with medication, bronchitis often lasts at least 3 weeks. This is a tough diagnosis. I am happy to see him back in the office later this week or next week to check on him if desired but this will likely not get better quickly no matter what we do. The check in is to make sure nothing else is going on.

## 2014-11-05 NOTE — Telephone Encounter (Signed)
Pt was in on 10/31/14 and saw Dr Sarajane Jews and was told he had bronchitis. He was in today for labs and stopped at the front desk to say that he is still having some wheezing and coughing weakness and is asking if there is something else he can take. Would like a call back   Pharmacy [facility]

## 2014-11-12 ENCOUNTER — Ambulatory Visit (INDEPENDENT_AMBULATORY_CARE_PROVIDER_SITE_OTHER): Payer: Medicare Other | Admitting: Family Medicine

## 2014-11-12 ENCOUNTER — Encounter: Payer: Self-pay | Admitting: Family Medicine

## 2014-11-12 VITALS — BP 120/70 | HR 66 | Temp 97.5°F | Wt 270.0 lb

## 2014-11-12 DIAGNOSIS — F411 Generalized anxiety disorder: Secondary | ICD-10-CM

## 2014-11-12 DIAGNOSIS — Z0000 Encounter for general adult medical examination without abnormal findings: Secondary | ICD-10-CM

## 2014-11-12 DIAGNOSIS — Z23 Encounter for immunization: Secondary | ICD-10-CM

## 2014-11-12 MED ORDER — HYDROCHLOROTHIAZIDE 25 MG PO TABS: 25.0000 mg | ORAL_TABLET | Freq: Every morning | ORAL | Status: DC

## 2014-11-12 MED ORDER — LOSARTAN POTASSIUM 100 MG PO TABS: 100.0000 mg | ORAL_TABLET | Freq: Every day | ORAL | Status: DC

## 2014-11-12 MED ORDER — ESCITALOPRAM OXALATE 20 MG PO TABS: 20.0000 mg | ORAL_TABLET | Freq: Every day | ORAL | Status: DC

## 2014-11-12 MED ORDER — TRAZODONE HCL 50 MG PO TABS: 25.0000 mg | ORAL_TABLET | Freq: Every evening | ORAL | Status: DC | PRN

## 2014-11-12 NOTE — Patient Instructions (Addendum)
Received final pneumonia shot ([id number]).  You have lost 24 lbs in a year! Congratulations! Keep up the great work. I am hopeful maybe we can reduce blood pressure medicine in the future.   Glad anxiety is better  Did not see any obvious cancers or precancers on the skin. You do have a darker spot under right nipple want to keep a close eye on. I would say seeing dermatology every 2 years for a skin check would be reasonable given the amount of sun exposure you have had over the years. Ask her to look at that spot when you see her next.   See me in 6 months

## 2014-11-12 NOTE — Progress Notes (Signed)
Wayne Reddish, MD Phone: [phone]  Subjective:  Patient presents today for their annual physical. Chief complaint-noted.   Exercising regularly in the pool and working on portion size down 24 lbs in a year! Patient is thrilled with progress.   Anxiety issues have resolved with lexapro 20mg , very pleased with control.  Sleeps well with trazodone  Follows with Dr. Alinda Money every 6 months and states has good reports.   Kidney function stable despite unilateral kidney  Blood pressure controlled on losartan and hctz.   Continues to follow with Dr. Buddy Duty of [facility] endocrinology.   CBGs fasting are improving and a1c just above normal at 5.7.   Snoring improved with weight loss.   ROS- full  review of systems was completed and negative including pertinent positives no chest pain, shortness of breath, new or changing skin lesions.   The following were reviewed and entered/updated in epic: Past Medical History  Diagnosis Date  . History of total knee replacement   . Hypertension   . Kidney stone   . Cancer   . Prostate cancer   . Kidney cancer, primary, with metastasis from kidney to other site   . Anxiety   . Pneumonia     hx of  . OSA (obstructive sleep apnea)     cpap at night  . Arthritis   . Pituitary cyst    Patient Active Problem List   Diagnosis Date Noted  . 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 Renal cell carcinoma 12/26/2007    Priority: High  . Hypothyroidism 07/12/2014    Priority: Medium  . Hyperglycemia 07/12/2014    Priority: Medium  . GAD (generalized anxiety disorder) 11/07/2013    Priority: Medium  . OSA (obstructive sleep apnea) 07/07/2011    Priority: Medium  . Morbid obesity 10/02/2008    Priority: Medium  . Essential hypertension 12/28/2006    Priority: Medium  . Bronchospasm 06/09/2011    Priority: Low  . Actinic keratosis 11/27/2009    Priority: Low  . Osteoarthritis 11/13/2008    Priority: Low  . TESTOSTERONE  DEFICIENCY 12/28/2006    Priority: Low   Past Surgical History  Procedure Laterality Date  . Appendectomy    . Cholecystectomy    . Nephrectomy Left   . Insertion prostate radiation seed    . Replacement total knee bilateral    . Knee arthroscopy  2007    left  . Anterior cervical decomp/discectomy fusion N/A 08/29/2012    Procedure: ANTERIOR CERVICAL DECOMPRESSION/DISCECTOMY FUSION 1 LEVEL;  Surgeon: Eustace Moore, MD;  Location: [place] [facility];  Service: Neurosurgery;  Laterality: N/A;  . Craniotomy N/A 11/08/2012    Procedure: CRANIOTOMY HYPOPHYSECTOMY TRANSNASAL APPROACH;  Surgeon: Faythe Ghee, MD;  Location: [place] [facility];  Service: Neurosurgery;  Laterality: N/A;  Transphenoidal Resection of Pituitary Tumor    Family History  Problem Relation Age of Onset  . Cancer Father     ?mouth, died before patient born  . Cancer Mother     stomach, died when he was young  . Kidney cancer Sister   . Colon cancer Brother   . Esophageal cancer Neg Hx   . Rectal cancer Neg Hx   . Stomach cancer Neg Hx     Medications- reviewed and updated Current Outpatient Prescriptions  Medication Sig Dispense Refill  . albuterol (PROVENTIL HFA;VENTOLIN HFA) 108 (90 BASE) MCG/ACT inhaler Inhale 2 puffs into the lungs every 4 (four) hours as needed for wheezing or  shortness of breath. (Patient not taking: Reported on 11/12/2014) 1 Inhaler 0  . aspirin EC 81 MG tablet Take 81 mg by mouth daily.    Marland Kitchen escitalopram (LEXAPRO) 10 MG tablet TAKE 1 TABLET (10 MG TOTAL) BY MOUTH DAILY. (Patient taking differently: TAKE 20 mg every day) 90 tablet 0  . hydrochlorothiazide (HYDRODIURIL) 25 MG tablet TAKE ONE TABLET BY MOUTH EVERY MORNING. 90 tablet 1  . HYDROcodone-homatropine (HYDROMET) 5-1.5 MG/5ML syrup Take 5 mLs by mouth every 4 (four) hours as needed. (Patient not taking: Reported on 11/12/2014) 240 mL 0  . levothyroxine (SYNTHROID, LEVOTHROID) 100 MCG tablet     . losartan (COZAAR) 100 MG tablet TAKE ONE  TABLET BY MOUTH DAILY 90 tablet 0  . Multiple Vitamins-Minerals (MULTIVITAMINS THER. W/MINERALS) TABS Take 1 tablet by mouth daily.    . traZODone (DESYREL) 50 MG tablet Take 0.5-1 tablets (25-50 mg total) by mouth at bedtime as needed for sleep. 30 tablet 2   No current facility-administered medications for this visit.    Allergies-reviewed and updated No Known Allergies  History   Social History  . Marital Status: Married    Spouse Name: Wayne Booth  . Number of Children: Y  . Years of Education: N/A   Occupational History  . retired Buyer, retail    Social History Main Topics  . Smoking status: Current Some Day Smoker -- 4 years    Types: Cigars  . Smokeless tobacco: Never Used     Comment: 1 cigar per month, with golf  . Alcohol Use: 1.2 oz/week    2 Shots of liquor per week     Comment: socially  . Drug Use: No  . Sexual Activity: Not on file   Other Topics Concern  . None   Social History Narrative   Married 1965 (wife patient of Dr. Megan Salon). 2 sons. 4 granddaughters.       Retired Chemical engineer.       Hobbies: golf, fishing, formerly hunting.     ROS--See HPI   Objective: BP 120/70 mmHg  Pulse 66  Temp(Src) 97.5 F (36.4 C)  Wt 270 lb (122.471 kg) Gen: NAD, resting comfortably HEENT: Mucous membranes are moist. Oropharynx normal. TM normal Neck: no thyromegaly CV: RRR no murmurs rubs or gallops Lungs: CTAB no crackles, wheeze, rhonchi Abdomen: soft/nontender/nondistended/normal bowel sounds. No rebound or guarding. Obese but improving Ext: no edema Skin: warm, dry Multiple seborrheic keratosis and benign appearing nevi. No AK on exam today. Does have one lesion under right nipple that has a darker shade than other regions about 2 x 32mm. We discussed watching this closely and seeing Dr. Ubaldo Glassing.  Neuro: grossly normal, moves all extremities, PERRLA   Assessment/Plan:  74 y.o. male presenting for annual physical.  Health  Maintenance counseling: 1. Anticipatory guidance: Patient counseled regarding regular dental exams, eye exams, wearing seatbelts, wearing sunscreen (misses some). Does not see dermatology regularly- Dr. Ubaldo Glassing in the past.  2. Risk factor reduction:  Advised patient of need for regular exercise and diet rich and fruits and vegetables to reduce risk of heart attack and stroke. Working on portion size and working out at [employer] in the pool- aerobics and walking.  3. Immunizations/screenings/ancillary studies- Prevnar given today  Orders Placed This Encounter  Procedures  . Pneumococcal conjugate vaccine 13-valent   Yearly refills Meds ordered this encounter  Medications  . traZODone (DESYREL) 50 MG tablet    Sig: Take 0.5-1 tablets (25-50 mg total) by mouth at bedtime  as needed for sleep.    Dispense:  90 tablet    Refill:  3  . hydrochlorothiazide (HYDRODIURIL) 25 MG tablet    Sig: Take 1 tablet (25 mg total) by mouth every morning.    Dispense:  90 tablet    Refill:  3  . escitalopram (LEXAPRO) 20 MG tablet    Sig: Take 1 tablet (20 mg total) by mouth daily. TAKE 20 mg every day    Dispense:  90 tablet    Refill:  3  . losartan (COZAAR) 100 MG tablet    Sig: Take 1 tablet (100 mg total) by mouth daily.    Dispense:  90 tablet    Refill:  3

## 2015-01-13 ENCOUNTER — Other Ambulatory Visit: Payer: Self-pay | Admitting: Internal Medicine

## 2015-01-16 DIAGNOSIS — H25013 Cortical age-related cataract, bilateral: Secondary | ICD-10-CM | POA: Diagnosis not present

## 2015-01-16 DIAGNOSIS — H2513 Age-related nuclear cataract, bilateral: Secondary | ICD-10-CM | POA: Diagnosis not present

## 2015-01-17 ENCOUNTER — Ambulatory Visit (INDEPENDENT_AMBULATORY_CARE_PROVIDER_SITE_OTHER)
Admission: RE | Admit: 2015-01-17 | Discharge: 2015-01-17 | Disposition: A | Discharge status: Home / Self Care | Payer: Medicare Other | Source: Ambulatory Visit | Attending: Family Medicine | Admitting: Family Medicine

## 2015-01-17 ENCOUNTER — Encounter: Payer: Self-pay | Admitting: Family Medicine

## 2015-01-17 ENCOUNTER — Ambulatory Visit (INDEPENDENT_AMBULATORY_CARE_PROVIDER_SITE_OTHER): Payer: Medicare Other | Admitting: Family Medicine

## 2015-01-17 VITALS — BP 128/74 | HR 68 | Wt 264.0 lb

## 2015-01-17 DIAGNOSIS — R6883 Chills (without fever): Secondary | ICD-10-CM

## 2015-01-17 DIAGNOSIS — R0781 Pleurodynia: Secondary | ICD-10-CM

## 2015-01-17 DIAGNOSIS — R0602 Shortness of breath: Secondary | ICD-10-CM | POA: Diagnosis not present

## 2015-01-17 DIAGNOSIS — S299XXA Unspecified injury of thorax, initial encounter: Secondary | ICD-10-CM | POA: Diagnosis not present

## 2015-01-17 MED ORDER — TRAMADOL HCL 50 MG PO TABS: 25.0000 mg | ORAL_TABLET | Freq: Three times a day (TID) | ORAL | Status: DC | PRN

## 2015-01-17 NOTE — Patient Instructions (Signed)
Get x-rays today. Rule out pneumonia and fracture. Possible you just bruised the rib and have an early cold.   Suspect this could take up to 6 weeks to heal  Short term pain medicine provided  Want you to work on deep breathing we discussed at least 3x an hour every hour for 5 breaths

## 2015-01-17 NOTE — Progress Notes (Signed)
Wayne Reddish, MD  Subjective:  Wayne Booth is a 74 y.o. year old very pleasant male patient who presents with:  Chills/ right sided rib pain -Rolled off high bed last week during the night- has never happened before. Hit right side ribs. Has had pain from that that seemed to get better then got worse a few days ago and also associated with chills. Hurts under ribs and up into shoulder blade. Mild cough. Mild congestion and runny nose. Hard to take a deep breath. Symptoms worse this morning. No sick contacts. Slight sweats then gets chilled. Worse with taking deep breath. Severe pain aching sensation.   ROS- no fevers, exertional chest pain, nausea, vomiting  Past Medical History- prostate cancer s/p seed implantation, renal cell carcinoma s/p resection 2009, HTN, GAD, OSA, hyothyroidism  Medications- reviewed and updated Current Outpatient Prescriptions  Medication Sig Dispense Refill  . aspirin EC 81 MG tablet Take 81 mg by mouth daily.    Marland Kitchen escitalopram (LEXAPRO) 20 MG tablet Take 1 tablet (20 mg total) by mouth daily. TAKE 20 mg every day 90 tablet 3  . hydrochlorothiazide (HYDRODIURIL) 25 MG tablet Take 1 tablet (25 mg total) by mouth every morning. 90 tablet 3  . levothyroxine (SYNTHROID, LEVOTHROID) 100 MCG tablet     . losartan (COZAAR) 100 MG tablet Take 1 tablet (100 mg total) by mouth daily. 90 tablet 3  . Multiple Vitamins-Minerals (MULTIVITAMINS THER. W/MINERALS) TABS Take 1 tablet by mouth daily.    . nortriptyline (PAMELOR) 10 MG capsule TAKE 1 CAPSULE (10 MG TOTAL) BY MOUTH AT BEDTIME. 90 capsule 2  . albuterol (PROVENTIL HFA;VENTOLIN HFA) 108 (90 BASE) MCG/ACT inhaler Inhale 2 puffs into the lungs every 4 (four) hours as needed for wheezing or shortness of breath. (Patient not taking: Reported on 11/12/2014) 1 Inhaler 0  . traZODone (DESYREL) 50 MG tablet Take 0.5-1 tablets (25-50 mg total) by mouth at bedtime as needed for sleep. (Patient not taking: Reported on  01/17/2015) 90 tablet 3   Objective: BP 128/74 mmHg  Pulse 68  Wt 264 lb (119.75 kg)  SpO2 97% Gen: NAD, resting comfortably in chair HEENTSlight rhinorrhea, oropharynx largely normal perhaps slight increased erythema, TM normal CV: RRR no murmurs rubs or gallops Lungs: CTAB no crackles, wheeze, rhonchi, appears uncomfortable with deep breaths Abdomen: soft/nontender/nondistended/normal bowel sounds. No rebound or guarding.  Skin: warm, dry, no rash Neuro: grossly normal, moves all extremities, normal gait  Assessment/Plan:  Chills - Plan: DG Chest 2 View, DG Ribs Unilateral Right Rib pain on right side - Plan: DG Chest 2 View, DG Ribs Unilateral Right Potential rib fractures on right and since he has not been breathing deeply, worried about pneumonia. Sats normal. Get dedicated rib films as well as CXR to eval pneumonia/fracture. Also possible just bad contusion and developing viral infection (some signs on exam and per history). Also gave short term tramadol low dose to use for pain.   Orders Placed This Encounter  Procedures  . DG Chest 2 View    Standing Status: Future     Number of Occurrences:      Standing Expiration Date: 03/19/2016    Order Specific Question:  Reason for Exam (SYMPTOM  OR DIAGNOSIS REQUIRED)    Answer:  fell on Ribs right side, now with chills, rule out pneumonia and fracture    Order Specific Question:  Preferred imaging location?    Answer:  [facility]  . DG Ribs Unilateral Right  Standing Status: Future     Number of Occurrences:      Standing Expiration Date: 03/19/2016    Order Specific Question:  Reason for Exam (SYMPTOM  OR DIAGNOSIS REQUIRED)    Answer:  fell on Ribs right side, now with chills, rule out pneumonia and fracture    Order Specific Question:  Preferred imaging location?    Answer:  [facility]    Meds ordered this encounter  Medications  . traMADol (ULTRAM) 50 MG tablet    Sig: Take 0.5-1 tablets (25-50 mg total) by  mouth every 8 (eight) hours as needed.    Dispense:  15 tablet    Refill:  0

## 2015-02-08 DIAGNOSIS — C61 Malignant neoplasm of prostate: Secondary | ICD-10-CM | POA: Diagnosis not present

## 2015-02-15 DIAGNOSIS — N401 Enlarged prostate with lower urinary tract symptoms: Secondary | ICD-10-CM | POA: Diagnosis not present

## 2015-02-15 DIAGNOSIS — R3916 Straining to void: Secondary | ICD-10-CM | POA: Diagnosis not present

## 2015-03-08 ENCOUNTER — Other Ambulatory Visit: Payer: Self-pay | Admitting: Neurological Surgery

## 2015-03-08 DIAGNOSIS — M5412 Radiculopathy, cervical region: Secondary | ICD-10-CM

## 2015-03-11 ENCOUNTER — Ambulatory Visit
Admission: RE | Admit: 2015-03-11 | Discharge: 2015-03-11 | Disposition: A | Discharge status: Home / Self Care | Payer: Medicare Other | Source: Ambulatory Visit | Attending: Neurological Surgery | Admitting: Neurological Surgery

## 2015-03-11 DIAGNOSIS — M5412 Radiculopathy, cervical region: Secondary | ICD-10-CM

## 2015-04-04 ENCOUNTER — Ambulatory Visit (INDEPENDENT_AMBULATORY_CARE_PROVIDER_SITE_OTHER): Payer: Medicare Other

## 2015-04-04 DIAGNOSIS — Z23 Encounter for immunization: Secondary | ICD-10-CM

## 2015-04-08 ENCOUNTER — Other Ambulatory Visit (HOSPITAL_COMMUNITY): Payer: Self-pay | Admitting: Neurological Surgery

## 2015-04-16 NOTE — Pre-Procedure Instructions (Signed)
Wayne Booth  04/16/2015     Your procedure is scheduled on : Wednesday April 24, 2015 at 8:30 AM.  Report to [facility] Admitting at 6:30 A.M.  Call this number if you have problems the morning of surgery: [phone]    Remember:  Do not eat food or drink liquids after midnight.  Take these medicines the morning of surgery with A SIP OF WATER : Albuterol inhaler if needed, Escitalopram (Lexapro), Levothyroxine (Synthroid), Tramadol (Ultram) if needed, Pain medication if needed, Valium if needed   Please stop taking any vitamins, herbal medications, Ibuprofen, Advil, Motrin, Aleve, etc    Bring CPAP mask the day of your surgery   Do not wear jewelry.  Do not wear lotions, powders, or cologne.    Men may shave face and neck.  Do not bring valuables to the hospital.  [facility] is not responsible for any belongings or valuables.  Contacts, dentures or bridgework may not be worn into surgery.  Leave your suitcase in the car.  After surgery it may be brought to your room.  For patients admitted to the hospital, discharge time will be determined by your treatment team.  Patients discharged the day of surgery will not be allowed to drive home.   Name and phone number of your driver:    Special instructions:  Shower using CHG soap the night before and the morning of your surgery  Please read over the following fact sheets that you were given. Pain Booklet, Coughing and Deep Breathing, MRSA Information and Surgical Site Infection Prevention

## 2015-04-17 ENCOUNTER — Other Ambulatory Visit: Payer: Self-pay

## 2015-04-17 ENCOUNTER — Encounter (HOSPITAL_COMMUNITY)
Admission: RE | Admit: 2015-04-17 | Discharge: 2015-04-17 | Disposition: A | Discharge status: Home / Self Care | Payer: Medicare Other | Source: Ambulatory Visit | Attending: Neurological Surgery | Admitting: Neurological Surgery

## 2015-04-17 ENCOUNTER — Encounter (HOSPITAL_COMMUNITY): Payer: Self-pay

## 2015-04-17 DIAGNOSIS — G4733 Obstructive sleep apnea (adult) (pediatric): Secondary | ICD-10-CM | POA: Diagnosis not present

## 2015-04-17 DIAGNOSIS — Z79899 Other long term (current) drug therapy: Secondary | ICD-10-CM | POA: Insufficient documentation

## 2015-04-17 DIAGNOSIS — M479 Spondylosis, unspecified: Secondary | ICD-10-CM | POA: Diagnosis not present

## 2015-04-17 DIAGNOSIS — M4802 Spinal stenosis, cervical region: Secondary | ICD-10-CM | POA: Insufficient documentation

## 2015-04-17 DIAGNOSIS — Z7982 Long term (current) use of aspirin: Secondary | ICD-10-CM | POA: Diagnosis not present

## 2015-04-17 DIAGNOSIS — Z01818 Encounter for other preprocedural examination: Secondary | ICD-10-CM | POA: Diagnosis present

## 2015-04-17 DIAGNOSIS — F17210 Nicotine dependence, cigarettes, uncomplicated: Secondary | ICD-10-CM | POA: Insufficient documentation

## 2015-04-17 DIAGNOSIS — I10 Essential (primary) hypertension: Secondary | ICD-10-CM | POA: Insufficient documentation

## 2015-04-17 DIAGNOSIS — E039 Hypothyroidism, unspecified: Secondary | ICD-10-CM | POA: Insufficient documentation

## 2015-04-17 DIAGNOSIS — Z8546 Personal history of malignant neoplasm of prostate: Secondary | ICD-10-CM | POA: Diagnosis not present

## 2015-04-17 DIAGNOSIS — Z01812 Encounter for preprocedural laboratory examination: Secondary | ICD-10-CM | POA: Diagnosis not present

## 2015-04-17 DIAGNOSIS — Z85528 Personal history of other malignant neoplasm of kidney: Secondary | ICD-10-CM | POA: Diagnosis not present

## 2015-04-17 HISTORY — DX: Hypothyroidism, unspecified: E03.9

## 2015-04-17 LAB — CBC WITH DIFFERENTIAL/PLATELET
BASOS PCT: 0 %
Basophils Absolute: 0 10*3/uL (ref 0.0–0.1)
EOS ABS: 0.4 10*3/uL (ref 0.0–0.7)
EOS PCT: 4 %
HCT: 49.1 % (ref 39.0–52.0)
HEMOGLOBIN: 16.9 g/dL (ref 13.0–17.0)
LYMPHS ABS: 2.8 10*3/uL (ref 0.7–4.0)
Lymphocytes Relative: 28 %
MCH: 31.4 pg (ref 26.0–34.0)
MCHC: 34.4 g/dL (ref 30.0–36.0)
MCV: 91.1 fL (ref 78.0–100.0)
MONO ABS: 0.8 10*3/uL (ref 0.1–1.0)
MONOS PCT: 8 %
NEUTROS PCT: 60 %
Neutro Abs: 5.8 10*3/uL (ref 1.7–7.7)
PLATELETS: 240 10*3/uL (ref 150–400)
RBC: 5.39 MIL/uL (ref 4.22–5.81)
RDW: 13.4 % (ref 11.5–15.5)
WBC: 9.8 10*3/uL (ref 4.0–10.5)

## 2015-04-17 LAB — SURGICAL PCR SCREEN
MRSA, PCR: NEGATIVE
Staphylococcus aureus: NEGATIVE

## 2015-04-17 LAB — BASIC METABOLIC PANEL
Anion gap: 8 (ref 5–15)
BUN: 15 mg/dL (ref 6–20)
CHLORIDE: 99 mmol/L — AB (ref 101–111)
CO2: 29 mmol/L (ref 22–32)
CREATININE: 1.12 mg/dL (ref 0.61–1.24)
Calcium: 9.4 mg/dL (ref 8.9–10.3)
Glucose, Bld: 105 mg/dL — ABNORMAL HIGH (ref 65–99)
Potassium: 4.6 mmol/L (ref 3.5–5.1)
SODIUM: 136 mmol/L (ref 135–145)

## 2015-04-17 LAB — PROTIME-INR
INR: 1.01 (ref 0.00–1.49)
PROTHROMBIN TIME: 13.5 s (ref 11.6–15.2)

## 2015-04-17 NOTE — Progress Notes (Signed)
PCP is [name]  Patient informed Nurse that he had a cardiac cath at [facility] several years ago  Patient denied having any acute cardiac or pulmonary issues  Patient has sleep apnea, but informed Nurse that he does not wear CPAP as ordered as bedtime

## 2015-04-18 NOTE — Progress Notes (Signed)
Anesthesia Chart Review:  Pt is 74 year old male scheduled for C5-6, C6-7 foraminotomy on 04/24/2015 with Dr. Ronnald Ramp.   PCP is Dr. [name].   PMH includes: HTN, OSA (not on CPAP), hypothyroidism, pituitary cyst, prostate cancer, renal cancer. Current smoker. BMI 36. S/p craniotomy hypophysectomy transnasal approach 11/08/12. S/p ACDF 08/29/12.   Medications include: albuterol, ASA, hctz, levothyroxine, losartan.   Preoperative labs reviewed.    Chest x-ray 01/17/2015 reviewed. Study is hypoventilatory with crowding of the perihilar and bibasilar bronchovascular markings. No acute findings seen.   EKG 04/17/2015: NSR. Cannot rule out Anterior infarct, age undetermined  Cardiac cath 10/22/2006 (for abnormal stress test): 1. Normal coronary arteries. 2. Normal left ventricular function.  If no changes, I anticipate pt can proceed with surgery as scheduled.   Willeen Cass, FNP-BC [facility]/Anesthesiology Phone: [phone] 04/18/2015 4:05 PM

## 2015-04-23 MED ORDER — CEFAZOLIN SODIUM-DEXTROSE 2-3 GM-% IV SOLR
2.0000 g | INTRAVENOUS | Status: AC
  Administered 2015-04-24: 2 g via INTRAVENOUS
  Filled 2015-04-23: qty 50

## 2015-04-23 MED ORDER — DEXAMETHASONE SODIUM PHOSPHATE 10 MG/ML IJ SOLN
10.0000 mg | INTRAMUSCULAR | Status: AC
  Administered 2015-04-24: 10 mg via INTRAVENOUS
  Filled 2015-04-23: qty 1

## 2015-04-23 NOTE — Anesthesia Preprocedure Evaluation (Addendum)
Anesthesia Evaluation  Patient identified by MRN, date of birth, ID band Patient awake    Reviewed: Allergy & Precautions, NPO status , Patient's Chart, lab work & pertinent test results  History of Anesthesia Complications Negative for: history of anesthetic complications  Airway Mallampati: II  TM Distance: >3 FB Neck ROM: Full    Dental  (+) Edentulous Upper, Dental Advisory Given   Pulmonary sleep apnea , Current Smoker,    Pulmonary exam normal        Cardiovascular hypertension, Pt. on medications Normal cardiovascular exam     Neuro/Psych PSYCHIATRIC DISORDERS Anxiety negative neurological ROS     GI/Hepatic negative GI ROS, Neg liver ROS,   Endo/Other  Hypothyroidism   Renal/GU Renal disease     Musculoskeletal   Abdominal   Peds  Hematology negative hematology ROS (+)   Anesthesia Other Findings   Reproductive/Obstetrics                            Anesthesia Physical Anesthesia Plan  ASA: III  Anesthesia Plan: General   Post-op Pain Management:    Induction: Intravenous  Airway Management Planned: Oral ETT  Additional Equipment:   Intra-op Plan:   Post-operative Plan: Extubation in OR  Informed Consent: I have reviewed the patients History and Physical, chart, labs and discussed the procedure including the risks, benefits and alternatives for the proposed anesthesia with the patient or authorized representative who has indicated his/her understanding and acceptance.   Dental advisory given  Plan Discussed with: CRNA, Anesthesiologist and Surgeon  Anesthesia Plan Comments:        Anesthesia Quick Evaluation

## 2015-04-24 ENCOUNTER — Encounter (HOSPITAL_COMMUNITY)
Admission: RE | Disposition: A | Discharge status: Home / Self Care | Payer: Self-pay | Source: Ambulatory Visit | Attending: Neurological Surgery

## 2015-04-24 ENCOUNTER — Inpatient Hospital Stay (HOSPITAL_COMMUNITY): Payer: Medicare Other | Admitting: Emergency Medicine

## 2015-04-24 ENCOUNTER — Inpatient Hospital Stay (HOSPITAL_COMMUNITY): Payer: Medicare Other

## 2015-04-24 ENCOUNTER — Ambulatory Visit (HOSPITAL_COMMUNITY)
Admission: RE | Admit: 2015-04-24 | Discharge: 2015-04-26 | Disposition: A | Discharge status: Home / Self Care | Payer: Medicare Other | Source: Ambulatory Visit | Attending: Neurological Surgery | Admitting: Neurological Surgery

## 2015-04-24 ENCOUNTER — Observation Stay (HOSPITAL_COMMUNITY): Payer: Medicare Other

## 2015-04-24 ENCOUNTER — Inpatient Hospital Stay (HOSPITAL_COMMUNITY): Payer: Medicare Other | Admitting: Anesthesiology

## 2015-04-24 ENCOUNTER — Encounter (HOSPITAL_COMMUNITY): Payer: Self-pay | Admitting: Neurological Surgery

## 2015-04-24 DIAGNOSIS — R269 Unspecified abnormalities of gait and mobility: Secondary | ICD-10-CM | POA: Diagnosis not present

## 2015-04-24 DIAGNOSIS — Z419 Encounter for procedure for purposes other than remedying health state, unspecified: Secondary | ICD-10-CM

## 2015-04-24 DIAGNOSIS — Z8546 Personal history of malignant neoplasm of prostate: Secondary | ICD-10-CM | POA: Diagnosis not present

## 2015-04-24 DIAGNOSIS — I10 Essential (primary) hypertension: Secondary | ICD-10-CM | POA: Diagnosis not present

## 2015-04-24 DIAGNOSIS — Z9889 Other specified postprocedural states: Secondary | ICD-10-CM

## 2015-04-24 DIAGNOSIS — Z7982 Long term (current) use of aspirin: Secondary | ICD-10-CM | POA: Insufficient documentation

## 2015-04-24 DIAGNOSIS — E039 Hypothyroidism, unspecified: Secondary | ICD-10-CM | POA: Diagnosis not present

## 2015-04-24 DIAGNOSIS — Z981 Arthrodesis status: Secondary | ICD-10-CM

## 2015-04-24 DIAGNOSIS — M50222 Other cervical disc displacement at C5-C6 level: Principal | ICD-10-CM | POA: Insufficient documentation

## 2015-04-24 DIAGNOSIS — F17290 Nicotine dependence, other tobacco product, uncomplicated: Secondary | ICD-10-CM | POA: Insufficient documentation

## 2015-04-24 HISTORY — PX: POSTERIOR CERVICAL LAMINECTOMY: SHX2248

## 2015-04-24 SURGERY — POSTERIOR CERVICAL LAMINECTOMY
Anesthesia: General | Laterality: Left

## 2015-04-24 MED ORDER — MENTHOL 3 MG MT LOZG: 1.0000 | LOZENGE | OROMUCOSAL | Status: DC | PRN

## 2015-04-24 MED ORDER — DEXAMETHASONE SODIUM PHOSPHATE 4 MG/ML IJ SOLN: 4.0000 mg | Freq: Four times a day (QID) | INTRAMUSCULAR | Status: DC

## 2015-04-24 MED ORDER — EPHEDRINE SULFATE 50 MG/ML IJ SOLN
INTRAMUSCULAR | Status: AC
  Filled 2015-04-24: qty 1

## 2015-04-24 MED ORDER — ONDANSETRON HCL 4 MG/2ML IJ SOLN
INTRAMUSCULAR | Status: AC
  Filled 2015-04-24: qty 2

## 2015-04-24 MED ORDER — DIAZEPAM 5 MG PO TABS
5.0000 mg | ORAL_TABLET | Freq: Four times a day (QID) | ORAL | Status: DC | PRN
  Administered 2015-04-24 – 2015-04-26 (×5): 5 mg via ORAL
  Filled 2015-04-24 (×4): qty 1

## 2015-04-24 MED ORDER — MORPHINE SULFATE (PF) 2 MG/ML IV SOLN: 1.0000 mg | INTRAVENOUS | Status: DC | PRN

## 2015-04-24 MED ORDER — THROMBIN 5000 UNITS EX SOLR
CUTANEOUS | Status: DC | PRN
  Administered 2015-04-24 (×2): 5000 [IU] via TOPICAL

## 2015-04-24 MED ORDER — SODIUM CHLORIDE 0.9 % IR SOLN
Status: DC | PRN
  Administered 2015-04-24: 10:00:00

## 2015-04-24 MED ORDER — THROMBIN 5000 UNITS EX SOLR
CUTANEOUS | Status: DC | PRN
  Administered 2015-04-24: 10:00:00 via TOPICAL

## 2015-04-24 MED ORDER — PHENOL 1.4 % MT LIQD: 1.0000 | OROMUCOSAL | Status: DC | PRN

## 2015-04-24 MED ORDER — STERILE WATER FOR INJECTION IJ SOLN
INTRAMUSCULAR | Status: AC
  Filled 2015-04-24: qty 10

## 2015-04-24 MED ORDER — HYDROMORPHONE HCL 1 MG/ML IJ SOLN
0.2500 mg | INTRAMUSCULAR | Status: DC | PRN
  Administered 2015-04-24 (×4): 0.25 mg via INTRAVENOUS

## 2015-04-24 MED ORDER — ESCITALOPRAM OXALATE 20 MG PO TABS
20.0000 mg | ORAL_TABLET | Freq: Every day | ORAL | Status: DC
Start: 2015-04-25 — End: 2015-04-26
  Administered 2015-04-25: 20 mg via ORAL
  Filled 2015-04-24 (×2): qty 1

## 2015-04-24 MED ORDER — PROPOFOL 10 MG/ML IV BOLUS
INTRAVENOUS | Status: AC
  Filled 2015-04-24: qty 20

## 2015-04-24 MED ORDER — METHYLPREDNISOLONE ACETATE 80 MG/ML IJ SUSP
INTRAMUSCULAR | Status: DC | PRN
  Administered 2015-04-24: 80 mg

## 2015-04-24 MED ORDER — FENTANYL CITRATE (PF) 250 MCG/5ML IJ SOLN
INTRAMUSCULAR | Status: AC
  Filled 2015-04-24: qty 5

## 2015-04-24 MED ORDER — ACETAMINOPHEN 650 MG RE SUPP: 650.0000 mg | RECTAL | Status: DC | PRN

## 2015-04-24 MED ORDER — NORTRIPTYLINE HCL 10 MG PO CAPS
10.0000 mg | ORAL_CAPSULE | Freq: Every day | ORAL | Status: DC
  Administered 2015-04-24 – 2015-04-25 (×2): 10 mg via ORAL
  Filled 2015-04-24 (×3): qty 1

## 2015-04-24 MED ORDER — SODIUM CHLORIDE 0.9 % IV SOLN: 250.0000 mL | INTRAVENOUS | Status: DC

## 2015-04-24 MED ORDER — SUCCINYLCHOLINE CHLORIDE 20 MG/ML IJ SOLN
INTRAMUSCULAR | Status: AC
  Filled 2015-04-24: qty 1

## 2015-04-24 MED ORDER — BUPIVACAINE HCL (PF) 0.25 % IJ SOLN
INTRAMUSCULAR | Status: DC | PRN
  Administered 2015-04-24: 4 mL

## 2015-04-24 MED ORDER — ONDANSETRON HCL 4 MG/2ML IJ SOLN
INTRAMUSCULAR | Status: DC | PRN
  Administered 2015-04-24: 4 mg via INTRAVENOUS

## 2015-04-24 MED ORDER — DEXAMETHASONE 4 MG PO TABS
4.0000 mg | ORAL_TABLET | Freq: Four times a day (QID) | ORAL | Status: DC
  Administered 2015-04-24 – 2015-04-26 (×8): 4 mg via ORAL
  Filled 2015-04-24 (×8): qty 1

## 2015-04-24 MED ORDER — POTASSIUM CHLORIDE IN NACL 20-0.9 MEQ/L-% IV SOLN
INTRAVENOUS | Status: DC
  Filled 2015-04-24 (×5): qty 1000

## 2015-04-24 MED ORDER — HYDROCHLOROTHIAZIDE 25 MG PO TABS
25.0000 mg | ORAL_TABLET | Freq: Every morning | ORAL | Status: DC
  Administered 2015-04-25: 25 mg via ORAL
  Filled 2015-04-24 (×2): qty 1

## 2015-04-24 MED ORDER — LOSARTAN POTASSIUM 50 MG PO TABS
100.0000 mg | ORAL_TABLET | Freq: Every day | ORAL | Status: DC
  Administered 2015-04-24 – 2015-04-25 (×2): 100 mg via ORAL
  Filled 2015-04-24 (×2): qty 2

## 2015-04-24 MED ORDER — SODIUM CHLORIDE 0.9 % IJ SOLN: 3.0000 mL | INTRAMUSCULAR | Status: DC | PRN

## 2015-04-24 MED ORDER — PHENYLEPHRINE 40 MCG/ML (10ML) SYRINGE FOR IV PUSH (FOR BLOOD PRESSURE SUPPORT)
PREFILLED_SYRINGE | INTRAVENOUS | Status: AC
  Filled 2015-04-24: qty 10

## 2015-04-24 MED ORDER — ROCURONIUM BROMIDE 100 MG/10ML IV SOLN
INTRAVENOUS | Status: DC | PRN
  Administered 2015-04-24: 50 mg via INTRAVENOUS

## 2015-04-24 MED ORDER — HYDROMORPHONE HCL 1 MG/ML IJ SOLN
INTRAMUSCULAR | Status: AC
  Filled 2015-04-24: qty 1

## 2015-04-24 MED ORDER — SENNA 8.6 MG PO TABS
1.0000 | ORAL_TABLET | Freq: Two times a day (BID) | ORAL | Status: DC
  Administered 2015-04-24 – 2015-04-25 (×4): 8.6 mg via ORAL
  Filled 2015-04-24 (×4): qty 1

## 2015-04-24 MED ORDER — SODIUM CHLORIDE 0.9 % IJ SOLN
3.0000 mL | Freq: Two times a day (BID) | INTRAMUSCULAR | Status: DC
  Administered 2015-04-24 (×2): 3 mL via INTRAVENOUS

## 2015-04-24 MED ORDER — ACETAMINOPHEN 325 MG PO TABS: 650.0000 mg | ORAL_TABLET | ORAL | Status: DC | PRN

## 2015-04-24 MED ORDER — SUGAMMADEX SODIUM 500 MG/5ML IV SOLN
INTRAVENOUS | Status: AC
  Filled 2015-04-24: qty 5

## 2015-04-24 MED ORDER — LACTATED RINGERS IV SOLN
INTRAVENOUS | Status: DC | PRN
Start: 2015-04-24 — End: 2015-04-24
  Administered 2015-04-24 (×2): via INTRAVENOUS

## 2015-04-24 MED ORDER — PROPOFOL 10 MG/ML IV BOLUS
INTRAVENOUS | Status: DC | PRN
  Administered 2015-04-24: 30 mg via INTRAVENOUS
  Administered 2015-04-24: 150 mg via INTRAVENOUS
  Administered 2015-04-24: 50 mg via INTRAVENOUS

## 2015-04-24 MED ORDER — 0.9 % SODIUM CHLORIDE (POUR BTL) OPTIME
TOPICAL | Status: DC | PRN
  Administered 2015-04-24: 1000 mL

## 2015-04-24 MED ORDER — EPHEDRINE SULFATE 50 MG/ML IJ SOLN
INTRAMUSCULAR | Status: DC | PRN
  Administered 2015-04-24 (×2): 5 mg via INTRAVENOUS

## 2015-04-24 MED ORDER — LIDOCAINE HCL (CARDIAC) 20 MG/ML IV SOLN
INTRAVENOUS | Status: AC
  Filled 2015-04-24: qty 5

## 2015-04-24 MED ORDER — MIDAZOLAM HCL 5 MG/5ML IJ SOLN
INTRAMUSCULAR | Status: DC | PRN
  Administered 2015-04-24 (×2): 1 mg via INTRAVENOUS

## 2015-04-24 MED ORDER — PHENYLEPHRINE HCL 10 MG/ML IJ SOLN
10.0000 mg | INTRAVENOUS | Status: DC | PRN
  Administered 2015-04-24: 20 ug/min via INTRAVENOUS

## 2015-04-24 MED ORDER — OXYCODONE-ACETAMINOPHEN 5-325 MG PO TABS
1.0000 | ORAL_TABLET | ORAL | Status: DC | PRN
  Administered 2015-04-24 – 2015-04-26 (×9): 2 via ORAL
  Filled 2015-04-24 (×9): qty 2

## 2015-04-24 MED ORDER — LEVOTHYROXINE SODIUM 100 MCG PO TABS
100.0000 ug | ORAL_TABLET | Freq: Every day | ORAL | Status: DC
  Administered 2015-04-25 – 2015-04-26 (×2): 100 ug via ORAL
  Filled 2015-04-24 (×2): qty 1

## 2015-04-24 MED ORDER — CELECOXIB 200 MG PO CAPS
200.0000 mg | ORAL_CAPSULE | Freq: Two times a day (BID) | ORAL | Status: DC
  Administered 2015-04-24 – 2015-04-25 (×4): 200 mg via ORAL
  Filled 2015-04-24 (×4): qty 1

## 2015-04-24 MED ORDER — ROCURONIUM BROMIDE 50 MG/5ML IV SOLN
INTRAVENOUS | Status: AC
  Filled 2015-04-24: qty 1

## 2015-04-24 MED ORDER — CEFAZOLIN SODIUM 1-5 GM-% IV SOLN
1.0000 g | Freq: Three times a day (TID) | INTRAVENOUS | Status: AC
  Administered 2015-04-24 – 2015-04-25 (×2): 1 g via INTRAVENOUS
  Filled 2015-04-24 (×2): qty 50

## 2015-04-24 MED ORDER — VECURONIUM BROMIDE 10 MG IV SOLR
INTRAVENOUS | Status: DC | PRN
  Administered 2015-04-24 (×4): 1 mg via INTRAVENOUS

## 2015-04-24 MED ORDER — LIDOCAINE HCL (CARDIAC) 20 MG/ML IV SOLN
INTRAVENOUS | Status: DC | PRN
  Administered 2015-04-24: 100 mg via INTRAVENOUS

## 2015-04-24 MED ORDER — FENTANYL CITRATE (PF) 100 MCG/2ML IJ SOLN
INTRAMUSCULAR | Status: DC | PRN
Start: 2015-04-24 — End: 2015-04-24
  Administered 2015-04-24: 50 ug via INTRAVENOUS
  Administered 2015-04-24: 150 ug via INTRAVENOUS
  Administered 2015-04-24: 50 ug via INTRAVENOUS

## 2015-04-24 MED ORDER — ALBUTEROL SULFATE (2.5 MG/3ML) 0.083% IN NEBU: 2.5000 mg | INHALATION_SOLUTION | RESPIRATORY_TRACT | Status: DC | PRN

## 2015-04-24 MED ORDER — HEMOSTATIC AGENTS (NO CHARGE) OPTIME
TOPICAL | Status: DC | PRN
  Administered 2015-04-24: 1 via TOPICAL

## 2015-04-24 MED ORDER — BACITRACIN ZINC 500 UNIT/GM EX OINT
TOPICAL_OINTMENT | CUTANEOUS | Status: DC | PRN
  Administered 2015-04-24: 1 via TOPICAL

## 2015-04-24 MED ORDER — VANCOMYCIN HCL 1000 MG IV SOLR
INTRAVENOUS | Status: DC | PRN
  Administered 2015-04-24: 1000 mg via TOPICAL

## 2015-04-24 MED ORDER — VECURONIUM BROMIDE 10 MG IV SOLR
INTRAVENOUS | Status: AC
  Filled 2015-04-24: qty 10

## 2015-04-24 MED ORDER — METHYLPREDNISOLONE SODIUM SUCC 125 MG IJ SOLR
INTRAMUSCULAR | Status: AC
  Filled 2015-04-24: qty 2

## 2015-04-24 MED ORDER — SUGAMMADEX SODIUM 500 MG/5ML IV SOLN
INTRAVENOUS | Status: DC | PRN
  Administered 2015-04-24: 250 mg via INTRAVENOUS

## 2015-04-24 MED ORDER — MIDAZOLAM HCL 2 MG/2ML IJ SOLN
INTRAMUSCULAR | Status: AC
  Filled 2015-04-24: qty 4

## 2015-04-24 MED ORDER — ARTIFICIAL TEARS OP OINT
TOPICAL_OINTMENT | OPHTHALMIC | Status: DC | PRN
  Administered 2015-04-24: 1 via OPHTHALMIC

## 2015-04-24 MED ORDER — ADULT MULTIVITAMIN W/MINERALS CH
1.0000 | ORAL_TABLET | Freq: Every day | ORAL | Status: DC
  Administered 2015-04-24 – 2015-04-25 (×2): 1 via ORAL
  Filled 2015-04-24 (×4): qty 1

## 2015-04-24 MED ORDER — ONDANSETRON HCL 4 MG/2ML IJ SOLN: 4.0000 mg | INTRAMUSCULAR | Status: DC | PRN

## 2015-04-24 MED ORDER — DIAZEPAM 5 MG PO TABS
ORAL_TABLET | ORAL | Status: AC
  Filled 2015-04-24: qty 1

## 2015-04-24 MED ORDER — PROMETHAZINE HCL 25 MG/ML IJ SOLN
6.2500 mg | INTRAMUSCULAR | Status: DC | PRN
Start: 2015-04-24 — End: 2015-04-24

## 2015-04-24 MED ORDER — VANCOMYCIN HCL 1000 MG IV SOLR
INTRAVENOUS | Status: AC
  Filled 2015-04-24: qty 1000

## 2015-04-24 MED ORDER — PHENYLEPHRINE HCL 10 MG/ML IJ SOLN
INTRAMUSCULAR | Status: DC | PRN
  Administered 2015-04-24 (×2): 120 ug via INTRAVENOUS
  Administered 2015-04-24 (×2): 80 ug via INTRAVENOUS

## 2015-04-24 MED ORDER — PROMETHAZINE HCL 25 MG/ML IJ SOLN
INTRAMUSCULAR | Status: AC
  Filled 2015-04-24: qty 1

## 2015-04-24 SURGICAL SUPPLY — 48 items
BAG DECANTER FOR FLEXI CONT (MISCELLANEOUS) ×2 IMPLANT
BENZOIN TINCTURE PRP APPL 2/3 (GAUZE/BANDAGES/DRESSINGS) ×2 IMPLANT
BLADE CLIPPER SURG (BLADE) IMPLANT
BUR MATCHSTICK NEURO 3.0 LAGG (BURR) ×2 IMPLANT
CANISTER SUCT 3000ML PPV (MISCELLANEOUS) ×2 IMPLANT
DRAPE C-ARM 42X72 X-RAY (DRAPES) ×4 IMPLANT
DRAPE LAPAROTOMY 100X72 PEDS (DRAPES) ×2 IMPLANT
DRAPE MICROSCOPE LEICA (MISCELLANEOUS) ×2 IMPLANT
DRAPE POUCH INSTRU U-SHP 10X18 (DRAPES) ×2 IMPLANT
DRSG OPSITE POSTOP 4X6 (GAUZE/BANDAGES/DRESSINGS) ×2 IMPLANT
DURAPREP 26ML APPLICATOR (WOUND CARE) ×2 IMPLANT
ELECT BLADE 4.0 EZ CLEAN MEGAD (MISCELLANEOUS) ×2
ELECT REM PT RETURN 9FT ADLT (ELECTROSURGICAL) ×2
ELECTRODE BLDE 4.0 EZ CLN MEGD (MISCELLANEOUS) ×1 IMPLANT
ELECTRODE REM PT RTRN 9FT ADLT (ELECTROSURGICAL) ×1 IMPLANT
GAUZE SPONGE 4X4 12PLY STRL (GAUZE/BANDAGES/DRESSINGS) IMPLANT
GAUZE SPONGE 4X4 16PLY XRAY LF (GAUZE/BANDAGES/DRESSINGS) IMPLANT
GLOVE BIO SURGEON STRL SZ8 (GLOVE) ×2 IMPLANT
GOWN STRL REUS W/ TWL LRG LVL3 (GOWN DISPOSABLE) IMPLANT
GOWN STRL REUS W/ TWL XL LVL3 (GOWN DISPOSABLE) ×1 IMPLANT
GOWN STRL REUS W/TWL 2XL LVL3 (GOWN DISPOSABLE) IMPLANT
GOWN STRL REUS W/TWL LRG LVL3 (GOWN DISPOSABLE)
GOWN STRL REUS W/TWL XL LVL3 (GOWN DISPOSABLE) ×1
HEMOSTAT POWDER KIT SURGIFOAM (HEMOSTASIS) IMPLANT
HEMOSTAT POWDER SURGIFOAM 1G (HEMOSTASIS) ×2 IMPLANT
KIT BASIN OR (CUSTOM PROCEDURE TRAY) ×2 IMPLANT
KIT ROOM TURNOVER OR (KITS) ×2 IMPLANT
LIQUID BAND (GAUZE/BANDAGES/DRESSINGS) ×2 IMPLANT
NEEDLE HYPO 18GX1.5 BLUNT FILL (NEEDLE) ×2 IMPLANT
NEEDLE HYPO 22GX1.5 SAFETY (NEEDLE) ×2 IMPLANT
NEEDLE SPNL 20GX3.5 QUINCKE YW (NEEDLE) ×2 IMPLANT
NS IRRIG 1000ML POUR BTL (IV SOLUTION) ×2 IMPLANT
PACK LAMINECTOMY NEURO (CUSTOM PROCEDURE TRAY) ×2 IMPLANT
PAD ARMBOARD 7.5X6 YLW CONV (MISCELLANEOUS) ×4 IMPLANT
PIN MAYFIELD SKULL DISP (PIN) ×2 IMPLANT
RUBBERBAND STERILE (MISCELLANEOUS) ×6 IMPLANT
SPONGE LAP 4X18 X RAY DECT (DISPOSABLE) IMPLANT
SPONGE SURGIFOAM ABS GEL SZ50 (HEMOSTASIS) ×2 IMPLANT
STRIP CLOSURE SKIN 1/2X4 (GAUZE/BANDAGES/DRESSINGS) ×2 IMPLANT
SUT VIC AB 0 CT1 18XCR BRD8 (SUTURE) ×1 IMPLANT
SUT VIC AB 0 CT1 8-18 (SUTURE) ×1
SUT VIC AB 2-0 CP2 18 (SUTURE) ×2 IMPLANT
SUT VIC AB 3-0 SH 8-18 (SUTURE) ×2 IMPLANT
SWABSTICK BENZOIN STERILE (MISCELLANEOUS) ×2 IMPLANT
SYR 3ML LL SCALE MARK (SYRINGE) ×2 IMPLANT
TOWEL OR 17X24 6PK STRL BLUE (TOWEL DISPOSABLE) ×2 IMPLANT
TOWEL OR 17X26 10 PK STRL BLUE (TOWEL DISPOSABLE) ×2 IMPLANT
WATER STERILE IRR 1000ML POUR (IV SOLUTION) ×2 IMPLANT

## 2015-04-24 NOTE — H&P (Signed)
Subjective:   Patient is a 74 y.o. male admitted for arm pain. The patient first presented to me with complaints of LUE pain. Onset of symptoms was a few months ago. The pain is described as aching and occurs all day. The pain is rated severe, and is located in the neck and radiates to the LUE. The symptoms have been progressive. Symptoms are exacerbated by activity, and are relieved by nothing.  Previous work up includes MRI. S/p C3-4 ACDF. Body habitus makes ACDF here prohibitive.   Past Medical History  Diagnosis Date  . History of total knee replacement   . Hypertension   . Anxiety   . Pneumonia     hx of  . Arthritis   . Pituitary cyst ([place])   . Kidney stone   . Hypothyroidism   . Cancer ([place])   . Prostate cancer ([place])   . Kidney cancer, primary, with metastasis from kidney to other site [facility])   . OSA (obstructive sleep apnea)     pt does not wear cpap at night    Past Surgical History  Procedure Laterality Date  . Appendectomy    . Cholecystectomy    . Nephrectomy Left   . Insertion prostate radiation seed    . Replacement total knee bilateral    . Knee arthroscopy  2007    left  . Anterior cervical decomp/discectomy fusion N/A 08/29/2012    Procedure: ANTERIOR CERVICAL DECOMPRESSION/DISCECTOMY FUSION 1 LEVEL;  Surgeon: Eustace Moore, MD;  Location: [place];  Service: Neurosurgery;  Laterality: N/A;  . Craniotomy N/A 11/08/2012    Procedure: CRANIOTOMY HYPOPHYSECTOMY TRANSNASAL APPROACH;  Surgeon: Faythe Ghee, MD;  Location: [place] [facility];  Service: Neurosurgery;  Laterality: N/A;  Transphenoidal Resection of Pituitary Tumor  . Colonoscopy w/ polypectomy    . Kidney stone surgery    . Cardiac catheterization      no PCI    No Known Allergies  Social History  Substance Use Topics  . Smoking status: Current Some Day Smoker -- 4 years    Types: Cigars  . Smokeless tobacco: Never Used     Comment: 1 cigar per month, with golf  . Alcohol Use: 1.2 oz/week    2  Shots of liquor per week     Comment: socially    Family History  Problem Relation Age of Onset  . Cancer Father     ?mouth, died before patient born  . Cancer Mother     stomach, died when he was young  . Kidney cancer Sister   . Colon cancer Brother   . Esophageal cancer Neg Hx   . Rectal cancer Neg Hx   . Stomach cancer Neg Hx    Prior to Admission medications   Medication Sig Start Date End Date Taking? Authorizing Provider  albuterol (PROVENTIL HFA;VENTOLIN HFA) 108 (90 BASE) MCG/ACT inhaler Inhale 2 puffs into the lungs every 4 (four) hours as needed for wheezing or shortness of breath. 10/31/14  Yes Laurey Morale, MD  aspirin EC 81 MG tablet Take 81 mg by mouth daily.   Yes Historical Provider, MD  diazepam (VALIUM) 5 MG tablet Take 5 mg by mouth every 6 (six) hours as needed for anxiety or muscle spasms.   Yes Historical Provider, MD  escitalopram (LEXAPRO) 20 MG tablet Take 1 tablet (20 mg total) by mouth daily. TAKE 20 mg every day 11/12/14  Yes Marin Olp, MD  hydrochlorothiazide (HYDRODIURIL) 25 MG tablet Take  1 tablet (25 mg total) by mouth every morning. 11/12/14  Yes Marin Olp, MD  HYDROcodone-acetaminophen (NORCO/VICODIN) 5-325 MG tablet Take 1 tablet by mouth every 6 (six) hours as needed for moderate pain.   Yes Historical Provider, MD  levothyroxine (SYNTHROID, LEVOTHROID) 100 MCG tablet Take 100 mcg by mouth daily before breakfast.  07/31/13  Yes Historical Provider, MD  losartan (COZAAR) 100 MG tablet Take 1 tablet (100 mg total) by mouth daily. 11/12/14  Yes Marin Olp, MD  Multiple Vitamins-Minerals (MULTIVITAMINS THER. W/MINERALS) TABS Take 1 tablet by mouth daily.   Yes Historical Provider, MD  nortriptyline (PAMELOR) 10 MG capsule TAKE 1 CAPSULE (10 MG TOTAL) BY MOUTH AT BEDTIME. 01/15/15  Yes Marin Olp, MD  traMADol (ULTRAM) 50 MG tablet Take 0.5-1 tablets (25-50 mg total) by mouth every 8 (eight) hours as needed. 01/17/15  Yes Marin Olp,  MD     Review of Systems  Positive ROS: neg  All other systems have been reviewed and were otherwise negative with the exception of those mentioned in the HPI and as above.  Objective: Vital signs in last 24 hours:    General Appearance: Alert, cooperative, no distress, appears stated age Head: Normocephalic, without obvious abnormality, atraumatic Eyes: PERRL, conjunctiva/corneas clear, EOM's intact      Neck: Supple, symmetrical, trachea midline, Back: Symmetric, no curvature, ROM normal, no CVA tenderness Lungs:  respirations unlabored Heart: Regular rate and rhythm Abdomen: Soft, non-tender Extremities: Extremities normal, atraumatic, no cyanosis or edema Pulses: 2+ and symmetric all extremities Skin: Skin color, texture, turgor normal, no rashes or lesions  NEUROLOGIC:  Mental status: Alert and oriented x4, no aphasia, good attention span, fund of knowledge and memory  Motor Exam - grossly normal Sensory Exam - grossly normal Reflexes: 1+ Coordination - grossly normal Gait - grossly normal Balance - grossly normal Cranial Nerves: I: smell Not tested  II: visual acuity  OS: nl    OD: nl  II: visual fields Full to confrontation  II: pupils Equal, round, reactive to light  III,VII: ptosis None  III,IV,VI: extraocular muscles  Full ROM  V: mastication Normal  V: facial light touch sensation  Normal  V,VII: corneal reflex  Present  VII: facial muscle function - upper  Normal  VII: facial muscle function - lower Normal  VIII: hearing Not tested  IX: soft palate elevation  Normal  IX,X: gag reflex Present  XI: trapezius strength  5/5  XI: sternocleidomastoid strength 5/5  XI: neck flexion strength  5/5  XII: tongue strength  Normal    Data Review Lab Results  Component Value Date   WBC 9.8 04/17/2015   HGB 16.9 04/17/2015   HCT 49.1 04/17/2015   MCV 91.1 04/17/2015   PLT 240 04/17/2015   Lab Results  Component Value Date   NA 136 04/17/2015   K 4.6  04/17/2015   CL 99* 04/17/2015   CO2 29 04/17/2015   BUN 15 04/17/2015   CREATININE 1.12 04/17/2015   GLUCOSE 105* 04/17/2015   Lab Results  Component Value Date   INR 1.01 04/17/2015    Assessment:   Cervical neck pain with herniated nucleus pulposus/ spondylosis/ stenosis at C5-6, C6-7. Patient has failed conservative therapy. Planned surgery : posterior cervical foraminotomy C5-6, C6-7  Plan:   I explained the condition and procedure to the patient and answered any questions.  Patient wishes to proceed with procedure as planned. Understands risks/ benefits/ and expected or typical outcomes.  Esme Freund S 04/24/2015 6:16 AM

## 2015-04-24 NOTE — Transfer of Care (Signed)
Immediate Anesthesia Transfer of Care Note  Patient: Wayne Booth  Procedure(s) Performed: Procedure(s): Foraminotomy cervical five - cervical six cervical six - cervical seven left (Left)  Patient Location: PACU  Anesthesia Type:General  Level of Consciousness: awake, alert  and oriented  Airway & Oxygen Therapy: Patient Spontanous Breathing and Patient connected to nasal cannula oxygen  Post-op Assessment: Report given to RN, Post -op Vital signs reviewed and stable and Patient moving all extremities X 4  Post vital signs: Reviewed and stable  Last Vitals:  Filed Vitals:   04/24/15 0707  BP: 135/74  Pulse: 79  Temp: 36.3 C  Resp: 18    Complications: No apparent anesthesia complications

## 2015-04-24 NOTE — Anesthesia Procedure Notes (Signed)
Procedure Name: Intubation Date/Time: 04/24/2015 8:42 AM Performed by: Garrison [place] T Pre-anesthesia Checklist: Patient identified, Emergency Drugs available, Suction available and Patient being monitored Patient Re-evaluated:Patient Re-evaluated prior to inductionOxygen Delivery Method: Circle system utilized Preoxygenation: Pre-oxygenation with 100% oxygen Intubation Type: IV induction Ventilation: Mask ventilation without difficulty and Oral airway inserted - appropriate to patient size Laryngoscope Size: Sabra Heck and 2 Grade View: Grade I Tube type: Oral Tube size: 7.5 mm Number of attempts: 1 Airway Equipment and Method: Stylet Placement Confirmation: ETT inserted through vocal cords under direct vision,  positive ETCO2 and breath sounds checked- equal and bilateral Secured at: 23 cm Tube secured with: Tape Dental Injury: Teeth and Oropharynx as per pre-operative assessment

## 2015-04-24 NOTE — Plan of Care (Signed)
Problem: Consults Goal: Diagnosis - Spinal Surgery Outcome: Completed/Met Date Met:  04/24/15 Cervical Spine Fusion     

## 2015-04-24 NOTE — Op Note (Signed)
04/24/2015  10:58 AM  PATIENT:  Wayne Booth  74 y.o. 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 MRI showed a herniated disc at C5-6 on the left. There is foraminal stenosis at C5-6 and C6 on the left. There is significant spondylosis. He tried medical management without relief. I recommended a posterior cervical decompression and possible discectomy. Patient understood the risks, benefits, and alternatives and potential outcomes and wished to proceed.  PROCEDURE DETAILS: The patient was brought to the operating room. Generalized endotracheal anesthesia was induced. The patient was affixed a 3 point [place] headrest and rolled into the prone position on chest rolls. All pressure points were padded. The posterior cervical region was cleaned and prepped with DuraPrep and then draped in the usual sterile fashion. 7 cc of local anesthesia was injected and a dorsal midline incision made in the posterior cervical region and carried down to the cervical fascia. The fascia was opened and the paraspinous musculature was taken down to expose C5-6 and C6-7 on the left. Intraoperative fluoroscopy confirmed my level at C5-6, and I had the radiologist, Dr. Lars Pinks, confirm that he felt that I was at C5-6 with a localizing AP and lateral fluoroscopy. I then used a combination of the high-speed drill and the Kerrison punches to perform a hematoma laminectomy, medial  facetectomy, and foraminotomies at C5-6 and C6-7 on the left. I opened the yellow ligament removed in a piecemeal fashion to expose the underlying dura and exiting nerve root. I undercut the lateral recess to decompress the nerve root distally into the foramen until I was distal to the pedicle. The nerve root was free and the nerve hook passed easily. At C5-6 I did find a herniated disc and removed multiple little fragments from the axilla of the C6 nerve root. I did incise the disc space but could not enter the disc space with a micropituitary rongeur. No disc herniation at C6-7 on the left. I irrigated with saline solution containing bacitracin and lined the dura with Gelfoam And Depo-Medrol. After hemostasis was achievedI placed vancomycin powder into the wound and  I closed the muscle and the fascia with 0 Vicryl, subcutaneous tissue with 2-0 Vicryl, and the subcuticular tissue with 3-0 Vicryl. The skin was closed with  Dermabond, benzoin and Steri-Strips. A sterile dressing was applied, the patient was turned to the supine position and taken out of the headrest, awakened from general anesthesia and transferred to the recovery room in stable condition. At the end of the procedure all sponge, needle and instrument counts were correct.   PLAN OF CARE: Admit for overnight observation  PATIENT DISPOSITION:  PACU - hemodynamically stable.   Delay start of Pharmacological VTE agent (>24hrs) due to surgical blood loss or risk of bleeding:  yes

## 2015-04-24 NOTE — Anesthesia Postprocedure Evaluation (Signed)
Anesthesia Post Note  Patient: Wayne Booth  Procedure(s) Performed: Procedure(s) (LRB): Foraminotomy cervical five - cervical six cervical six - cervical seven left (Left)  Anesthesia type: general  Patient location: PACU  Post pain: Pain level controlled  Post assessment: Patient's Cardiovascular Status Stable  Last Vitals:  Filed Vitals:   04/24/15 1215  BP: 134/75  Pulse: 85  Temp: 36.8 C  Resp: 20    Post vital signs: Reviewed and stable  Level of consciousness: sedated  Complications: No apparent anesthesia complications

## 2015-04-25 ENCOUNTER — Encounter (HOSPITAL_COMMUNITY): Payer: Self-pay | Admitting: Neurological Surgery

## 2015-04-25 DIAGNOSIS — M50222 Other cervical disc displacement at C5-C6 level: Secondary | ICD-10-CM | POA: Diagnosis not present

## 2015-04-25 NOTE — Progress Notes (Signed)
Orthopedic Tech Progress Note Patient Details:  Wayne Booth 07/03/1940 [id number]  Ortho Devices Type of Ortho Device: Soft collar Ortho Device/Splint Interventions: Application   [name] 04/25/2015, 4:46 PM

## 2015-04-25 NOTE — Progress Notes (Signed)
Subjective: Patient reports Doing well incisional pain but are much better still with some burning dysesthetic pain  Objective: Vital signs in last 24 hours: Temp:  [97 F (36.1 C)-98.2 F (36.8 C)] 97.3 F (36.3 C) (10/27 0400) Pulse Rate:  [84-98] 91 (10/27 0400) Resp:  [18-50] 18 (10/27 0400) BP: (102-134)/(60-75) 120/67 mmHg (10/27 0400) SpO2:  [93 %-98 %] 98 % (10/27 0400)  Intake/Output from previous day: 10/26 0701 - 10/27 0700 In: 2240 [P.O.:840; I.V.:1300] Out: 200 [Blood:200] Intake/Output this shift:    Thank out of 5 wound clean dry and intact  Lab Results: No results for input(s): WBC, HGB, HCT, PLT in the last 72 hours. BMET No results for input(s): NA, K, CL, CO2, GLUCOSE, BUN, CREATININE, CALCIUM in the last 72 hours.  Studies/Results: Dg Cervical Spine 2-3 Views  04/24/2015  CLINICAL DATA:  74 year old male undergoing cervical spine surgery. Initial encounter. EXAM: CERVICAL SPINE - 2-3 VIEW COMPARISON:  Cervical spine MRI 03/11/2015 and earlier FINDINGS: Preexisting C3-C4 ACDF hardware. These images demonstrate posterior cervical spine localization at the C5-C6 level. IMPRESSION: Intraoperative localization at the posterior C5-C6 level. Study discussed by telephone with Dr. Sherley Bounds on 04/24/2015 at 09:52 . Electronically Signed   By: Genevie Ann M.D.   On: 04/24/2015 09:52   Dg C-arm 1-60 Min-no Report  04/24/2015  CLINICAL DATA: cervical foraminotomy C-ARM 1-60 MINUTES Fluoroscopy was utilized by the requesting physician.  No radiographic interpretation.    Assessment/Plan: Continue observation mobilized today with physical therapy possible discharge tomorrow  LOS: 1 day     Decorian Schuenemann P 04/25/2015, 7:44 AM

## 2015-04-25 NOTE — Evaluation (Signed)
Physical Therapy Evaluation Patient Details Name: Wayne Booth MRN: [id number] DOB: Nov 19, 1940 Today's Date: 04/25/2015   History of Present Illness  Pt is a 74 y/o M s/p Left C5-6 C6-7 hemilaminectomy, medial facetectomy, and foraminotomy followed by microdiscectomy C5-6 on the left. PMH of HTN, OSA (not on CPAP), hypothyroidism, pituitary cyst, prostate cancer, renal cancer. S/p craniotomy hypophysectomy transnasal approach 11/08/12. S/p ACDF 08/29/12.   Clinical Impression  Pt presents with impairments as indicated below.  He would benefit from further acute skilled PT services to maximize safety and independence with functional mobility as pain decreased.  No follow-up PT recommended at this time.    Follow Up Recommendations No PT follow up;Supervision - Intermittent    Equipment Recommendations  None recommended by PT    Recommendations for Other Services       Precautions / Restrictions Precautions Precautions: Cervical Restrictions Weight Bearing Restrictions: No      Mobility  Bed Mobility Overal bed mobility: Modified Independent             General bed mobility comments: increased time and use of bed rail; increased pain  Transfers Overall transfer level: Needs assistance Equipment used: None Transfers: Sit to/from Stand Sit to Stand: Min assist         General transfer comment: VCs to scoot to EOB and lean forward with neck neutral. Min assist for initiate stand.  Ambulation/Gait Ambulation/Gait assistance: Min guard Ambulation Distance (Feet): 100 Feet Assistive device: None Gait Pattern/deviations: Step-through pattern;Decreased stride length (guarded) Gait velocity: slow Gait velocity interpretation: Below normal speed for age/gender General Gait Details: Pt gait is very slow with very guarded/rigid movements. Some unsteadiness noted with ambulation as well.  PT providing cues to relax shoulders and for increased speed for improved  balance.  Stairs            Wheelchair Mobility    Modified Rankin (Stroke Patients Only)       Balance Overall balance assessment: Needs assistance Sitting-balance support: No upper extremity supported;Feet supported Sitting balance-Leahy Scale: Good     Standing balance support: During functional activity;No upper extremity supported Standing balance-Leahy Scale: Good                               Pertinent Vitals/Pain Pain Assessment: 0-10 Pain Score: 5  (increaed with activity) Pain Location: surgical site and radiating down L arm Pain Descriptors / Indicators: Aching;Operative site guarding;Radiating Pain Intervention(s): Monitored during session;Ice applied    Home Living Family/patient expects to be discharged to:: Private residence Living Arrangements: Spouse/significant other Available Help at Discharge: Family;Available PRN/intermittently Type of Home: Other(Comment) (town home) Home Access: Level entry     Home Layout: Able to live on main level with bedroom/bathroom;Two level Home Equipment: None      Prior Function Level of Independence: Independent               Hand Dominance        Extremity/Trunk Assessment   Upper Extremity Assessment: Overall WFL for tasks assessed       LUE Deficits / Details: pain radiating down L UE   Lower Extremity Assessment: Overall WFL for tasks assessed         Communication   Communication: No difficulties  Cognition Arousal/Alertness: Awake/alert Behavior During Therapy: WFL for tasks assessed/performed Overall Cognitive Status: Within Functional Limits for tasks assessed  General Comments General comments (skin integrity, edema, etc.): Pt has increased pain with activity during which it hurts at the operative site and radiates down his L arm.  He is requesting a soft cervical collar to help hold his head up as he has the tendency to keep it in a  flexed position.  Pt has been educated on proper neck posture.    Exercises        Assessment/Plan    PT Assessment Patient needs continued PT services  PT Diagnosis Abnormality of gait;Acute pain   PT Problem List Decreased strength;Decreased range of motion;Decreased activity tolerance;Decreased balance;Decreased mobility;Decreased safety awareness;Decreased knowledge of precautions  PT Treatment Interventions Gait training;Stair training;Functional mobility training;Therapeutic activities;Therapeutic exercise;Balance training;Patient/family education   PT Goals (Current goals can be found in the Care Plan section) Acute Rehab PT Goals Patient Stated Goal: none stated PT Goal Formulation: With patient Time For Goal Achievement: 04/29/15 Potential to Achieve Goals: Good    Frequency Min 5X/week   Barriers to discharge        Co-evaluation               End of Session Equipment Utilized During Treatment: Gait belt Activity Tolerance: Patient tolerated treatment well Patient left: in bed;with call bell/phone within reach Nurse Communication: Mobility status;Other (comment) (request for soft cervical collar)    Functional Assessment Tool Used: clinical judgement Functional Limitation: Mobility: Walking and moving around Mobility: Walking and Moving Around Current Status ([id number]): At least 1 percent but less than 20 percent impaired, limited or restricted Mobility: Walking and Moving Around Goal Status [phone]): At least 1 percent but less than 20 percent impaired, limited or restricted Mobility: Walking and Moving Around Discharge Status [phone]): At least 1 percent but less than 20 percent impaired, limited or restricted    Time: 2952-8413 PT Time Calculation (min) (ACUTE ONLY): 14 min   Charges:   PT Evaluation $Initial PT Evaluation Tier I: 1 Procedure     PT G Codes:   PT G-Codes **NOT FOR INPATIENT CLASS** Functional Assessment Tool Used: clinical  judgement Functional Limitation: Mobility: Walking and moving around Mobility: Walking and Moving Around Current Status ([id number]): At least 1 percent but less than 20 percent impaired, limited or restricted Mobility: Walking and Moving Around Goal Status [phone]): At least 1 percent but less than 20 percent impaired, limited or restricted Mobility: Walking and Moving Around Discharge Status [phone]): At least 1 percent but less than 20 percent impaired, limited or restricted    Lorita Officer 04/25/2015, 4:48 PM Lorita Officer, SPT

## 2015-04-26 DIAGNOSIS — M50222 Other cervical disc displacement at C5-C6 level: Secondary | ICD-10-CM | POA: Diagnosis not present

## 2015-04-26 MED ORDER — DIAZEPAM 5 MG PO TABS: 5.0000 mg | ORAL_TABLET | Freq: Four times a day (QID) | ORAL | Status: DC | PRN

## 2015-04-26 MED ORDER — OXYCODONE-ACETAMINOPHEN 5-325 MG PO TABS: 1.0000 | ORAL_TABLET | ORAL | Status: DC | PRN

## 2015-04-26 NOTE — Discharge Summary (Signed)
  Physician Discharge Summary  Patient ID: Wayne Booth MRN: [id number] DOB/AGE: [birth year] 74 y.o.  Admit date: 04/24/2015 Discharge date: 04/26/2015  Admission Diagnoses: Cervical spondylosis and stenosis  Discharge Diagnoses: Same Active Problems:   Status post cervical discectomy   Discharged Condition: good  Hospital Course: Patient is [facility] underwent posterior cervical laminectomy foraminotomy postoperative patient did very well recovered in the floor on the floor he was ambulating and voiding spontaneously tolerating a regular diet was stable for discharge home. He brought to my attention to the injury to his right great toe that he had prior to admission we will arrange follow up with orthopedics for this he will call the office for those appointments as well as his follow-up with Dr. Ronnald Ramp.  Consults: Significant Diagnostic Studies: Treatments: Posterior cervical laminectomy and foraminotomies Discharge Exam: Blood pressure 145/87, pulse 92, temperature 97.6 F (36.4 C), temperature source Oral, resp. rate 20, SpO2 95 %. Strength out of 5 wound clean dry and intact  Disposition: Home     Medication List    TAKE these medications        albuterol 108 (90 BASE) MCG/ACT inhaler  Commonly known as:  PROVENTIL HFA;VENTOLIN HFA  Inhale 2 puffs into the lungs every 4 (four) hours as needed for wheezing or shortness of breath.     aspirin EC 81 MG tablet  Take 81 mg by mouth daily.     diazepam 5 MG tablet  Commonly known as:  VALIUM  Take 5 mg by mouth every 6 (six) hours as needed for anxiety or muscle spasms.     diazepam 5 MG tablet  Commonly known as:  VALIUM  Take 1 tablet (5 mg total) by mouth every 6 (six) hours as needed for anxiety or muscle spasms.     escitalopram 20 MG tablet  Commonly known as:  LEXAPRO  Take 1 tablet (20 mg total) by mouth daily. TAKE 20 mg every day     hydrochlorothiazide 25 MG tablet  Commonly known as:   HYDRODIURIL  Take 1 tablet (25 mg total) by mouth every morning.     HYDROcodone-acetaminophen 5-325 MG tablet  Commonly known as:  NORCO/VICODIN  Take 1 tablet by mouth every 6 (six) hours as needed for moderate pain.     levothyroxine 100 MCG tablet  Commonly known as:  SYNTHROID, LEVOTHROID  Take 100 mcg by mouth daily before breakfast.     losartan 100 MG tablet  Commonly known as:  COZAAR  Take 1 tablet (100 mg total) by mouth daily.     multivitamins ther. w/minerals Tabs tablet  Take 1 tablet by mouth daily.     nortriptyline 10 MG capsule  Commonly known as:  PAMELOR  TAKE 1 CAPSULE (10 MG TOTAL) BY MOUTH AT BEDTIME.     oxyCODONE-acetaminophen 5-325 MG tablet  Commonly known as:  PERCOCET/ROXICET  Take 1-2 tablets by mouth every 4 (four) hours as needed for moderate pain.     traMADol 50 MG tablet  Commonly known as:  ULTRAM  Take 0.5-1 tablets (25-50 mg total) by mouth every 8 (eight) hours as needed.           Follow-up Information    Follow up with Eustace Moore, MD.   Specialty:  Neurosurgery   Contact information:   [address] [place] [address] [phone]       Signed: Elaina Hoops 04/26/2015, 7:26 AM

## 2015-04-26 NOTE — Progress Notes (Signed)
Patient ID: Wayne Booth, male   DOB: 07-14-1940, 74 y.o.   MRN: [id number] Doing well significant improvement arm pain and will neck pain  Neurologically stable has a ecchymoses around his right great toe from banging his to prior to admission earlier on the week. He appears to be neurovascularly intact.  Discharged home scheduled follow-up with Dr. Ronnald Ramp and will arrange orthopedic follow-up for evaluation of his foot.

## 2015-04-26 NOTE — Progress Notes (Signed)
Pt doing well. Pt and son given D/C instructions with Rx's, verbal understanding was provided. Pt's incision is clean and dry with no sign of infection. Pt's IV was removed prior to D/C. Pt D/C'd home via walking @ 0810 per MD order. Pt is stable @ D/C and has no other needs at this time. Holli Humbles, RN

## 2015-04-26 NOTE — Discharge Instructions (Signed)
No lifting no bending no twisting no driving a riding a car lysis, back and forth to see Dr. Ronnald Ramp. Call the office and find that when follow-up this keep incision clean dry and intact.  Wound Care Keep incision covered and dry for one week.  If you shower prior to then, cover incision with plastic wrap.  You may remove outer bandage after one week and shower.  Do not put any creams, lotions, or ointments on incision. Leave steri-strips on neck.  They will fall off by themselves. Activity Walk each and every day, increasing distance each day. No lifting greater than 5 lbs.  Avoid excessive neck motion. No driving for 2 weeks; may ride as a passenger locally. Wear neck brace at all times except when showering or otherwise instructed. Diet Resume your normal diet.  Return to Work Will be discussed at you follow up appointment. Call Your Doctor If Any of These Occur Redness, drainage, or swelling at the wound.  Temperature greater than 101 degrees. Severe pain not relieved by pain medication. Increased difficulty swallowing.  Incision starts to come apart. Follow Up Appt Call today for appointment in 1-2 weeks ([phone]) or for problems.  If you have any hardware placed in your spine, you will need an x-ray before your appointment.

## 2015-05-15 ENCOUNTER — Ambulatory Visit: Payer: Medicare Other | Admitting: Family Medicine

## 2015-06-11 ENCOUNTER — Telehealth: Payer: Self-pay | Admitting: Family Medicine

## 2015-06-11 ENCOUNTER — Ambulatory Visit (INDEPENDENT_AMBULATORY_CARE_PROVIDER_SITE_OTHER): Payer: Medicare Other | Admitting: Family Medicine

## 2015-06-11 ENCOUNTER — Encounter: Payer: Self-pay | Admitting: Family Medicine

## 2015-06-11 VITALS — BP 130/72 | HR 91 | Temp 99.2°F | Wt 271.0 lb

## 2015-06-11 DIAGNOSIS — M79674 Pain in right toe(s): Secondary | ICD-10-CM | POA: Diagnosis not present

## 2015-06-11 MED ORDER — PREDNISONE 20 MG PO TABS: ORAL_TABLET | ORAL | Status: DC

## 2015-06-11 NOTE — Telephone Encounter (Signed)
Pt has gout in big toe and would like to know if md would call med into [facility] [address].

## 2015-06-11 NOTE — Telephone Encounter (Signed)
Pt has been sch for today at 445pm

## 2015-06-11 NOTE — Telephone Encounter (Signed)
Can u review chart and see if he has ever been seen for gout or ever taken medicine? If not- needs visit- he can just come and we will work him in

## 2015-06-11 NOTE — Patient Instructions (Addendum)
Not a perfect picture for gout but could be  Prednisone will treat gout, pseudogout, and may help with arthritis  Will not treat infection so if you have worsening redness expanding up the foot please see me immediately  If this is truly gout (we will get a uric acid when you are feeling better) then we may have to stop the hydrochlorothiazide which can cause gout   Gout Gout is an inflammatory arthritis caused by a buildup of uric acid crystals in the joints. Uric acid is a chemical that is normally present in the blood. When the level of uric acid in the blood is too high it can form crystals that deposit in your joints and tissues. This causes joint redness, soreness, and swelling (inflammation). Repeat attacks are common. Over time, uric acid crystals can form into masses (tophi) near a joint, destroying bone and causing disfigurement. Gout is treatable and often preventable. CAUSES  The disease begins with elevated levels of uric acid in the blood. Uric acid is produced by your body when it breaks down a naturally found substance called purines. Certain foods you eat, such as meats and fish, contain high amounts of purines. Causes of an elevated uric acid level include:  Being passed down from parent to child (heredity).  Diseases that cause increased uric acid production (such as obesity, psoriasis, and certain cancers).  Excessive alcohol use.  Diet, especially diets rich in meat and seafood.  Medicines, including certain cancer-fighting medicines (chemotherapy), water pills (diuretics), and aspirin.  Chronic kidney disease. The kidneys are no longer able to remove uric acid well. 1 kidney doesn't give as much chance to remove  Problems with metabolism. Conditions strongly associated with gout include:  Obesity.  High blood pressure.  High cholesterol.  Diabetes. Not everyone with elevated uric acid levels gets gout. It is not understood why some people get gout and others  do not. Surgery, joint injury, and eating too much of certain foods are some of the factors that can lead to gout attacks. SYMPTOMS   An attack of gout comes on quickly. It causes intense pain with redness, swelling, and warmth in a joint.  Fever can occur.  Often, only one joint is involved. Certain joints are more commonly involved:  Base of the big toe.  Knee.  Ankle.  Wrist.  Finger. Without treatment, an attack usually goes away in a few days to weeks. Between attacks, you usually will not have symptoms, which is different from many other forms of arthritis. DIAGNOSIS  Your caregiver will suspect gout based on your symptoms and exam. In some cases, tests may be recommended. The tests may include:  Blood tests.  Urine tests.  X-rays.  Joint fluid exam. This exam requires a needle to remove fluid from the joint (arthrocentesis). Using a microscope, gout is confirmed when uric acid crystals are seen in the joint fluid. TREATMENT  There are two phases to gout treatment: treating the sudden onset (acute) attack and preventing attacks (prophylaxis).  Treatment of an Acute Attack.  Medicines are used. These include anti-inflammatory medicines or steroid medicines.  An injection of steroid medicine into the affected joint is sometimes necessary.  The painful joint is rested. Movement can worsen the arthritis.  You may use warm or cold treatments on painful joints, depending which works best for you.  Treatment to Prevent Attacks.  If you suffer from frequent gout attacks, your caregiver may advise preventive medicine. These medicines are started after the acute attack  subsides. These medicines either help your kidneys eliminate uric acid from your body or decrease your uric acid production. You may need to stay on these medicines for a very long time.  The early phase of treatment with preventive medicine can be associated with an increase in acute gout attacks. For this  reason, during the first few months of treatment, your caregiver may also advise you to take medicines usually used for acute gout treatment. Be sure you understand your caregiver's directions. Your caregiver may make several adjustments to your medicine dose before these medicines are effective.  Discuss dietary treatment with your caregiver or dietitian. Alcohol and drinks high in sugar and fructose and foods such as meat, poultry, and seafood can increase uric acid levels. Your caregiver or dietitian can advise you on drinks and foods that should be limited. HOME CARE INSTRUCTIONS   Do not take aspirin to relieve pain. This raises uric acid levels.  Only take over-the-counter or prescription medicines for pain, discomfort, or fever as directed by your caregiver.  Rest the joint as much as possible. When in bed, keep sheets and blankets off painful areas.  Keep the affected joint raised (elevated).  Apply warm or cold treatments to painful joints. Use of warm or cold treatments depends on which works best for you.  Use crutches if the painful joint is in your leg.  Drink enough fluids to keep your urine clear or pale yellow. This helps your body get rid of uric acid. Limit alcohol, sugary drinks, and fructose drinks.  Follow your dietary instructions. Pay careful attention to the amount of protein you eat. Your daily diet should emphasize fruits, vegetables, whole grains, and fat-free or low-fat milk products. Discuss the use of coffee, vitamin C, and cherries with your caregiver or dietitian. These may be helpful in lowering uric acid levels.  Maintain a healthy body weight. SEEK MEDICAL CARE IF:   You develop diarrhea, vomiting, or any side effects from medicines.  You do not feel better in 24 hours, or you are getting worse. SEEK IMMEDIATE MEDICAL CARE IF:   Your joint becomes suddenly more tender, and you have chills or a fever. MAKE SURE YOU:   Understand these  instructions.  Will watch your condition.  Will get help right away if you are not doing well or get worse.   This information is not intended to replace advice given to you by your health care provider. Make sure you discuss any questions you have with your health care provider.   Document Released: 06/12/2000 Document Revised: 07/06/2014 Document Reviewed: 01/27/2012 Elsevier Interactive Patient Education [employer].

## 2015-06-11 NOTE — Progress Notes (Signed)
Wayne Reddish, MD  Subjective:  Wayne Booth is a 74 y.o. year old very pleasant male patient who presents for/with See problem oriented charting ROS- no fever, chills, nausea, vomiting. No arthralgias in other joitns other than neck for which he had surgery for  Past Medical History-  Patient Active Problem List   Diagnosis Date Noted  . 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 Renal cell carcinoma ([place]) 12/26/2007    Priority: High  . Hypothyroidism 07/12/2014    Priority: Medium  . Hyperglycemia 07/12/2014    Priority: Medium  . GAD (generalized anxiety disorder) 11/07/2013    Priority: Medium  . OSA (obstructive sleep apnea) 07/07/2011    Priority: Medium  . Morbid obesity ([place]) 10/02/2008    Priority: Medium  . Essential hypertension 12/28/2006    Priority: Medium  . Bronchospasm 06/09/2011    Priority: Low  . Actinic keratosis 11/27/2009    Priority: Low  . Osteoarthritis 11/13/2008    Priority: Low  . TESTOSTERONE DEFICIENCY 12/28/2006    Priority: Low  . Status post cervical discectomy 04/24/2015    Medications- reviewed and updated Current Outpatient Prescriptions  Medication Sig Dispense Refill  . aspirin EC 81 MG tablet Take 81 mg by mouth daily.    Marland Kitchen escitalopram (LEXAPRO) 20 MG tablet Take 1 tablet (20 mg total) by mouth daily. TAKE 20 mg every day 90 tablet 3  . hydrochlorothiazide (HYDRODIURIL) 25 MG tablet Take 1 tablet (25 mg total) by mouth every morning. 90 tablet 3  . levothyroxine (SYNTHROID, LEVOTHROID) 100 MCG tablet Take 100 mcg by mouth daily before breakfast.     . losartan (COZAAR) 100 MG tablet Take 1 tablet (100 mg total) by mouth daily. 90 tablet 3  . Multiple Vitamins-Minerals (MULTIVITAMINS THER. W/MINERALS) TABS Take 1 tablet by mouth daily.    . nortriptyline (PAMELOR) 10 MG capsule TAKE 1 CAPSULE (10 MG TOTAL) BY MOUTH AT BEDTIME. 90 capsule 2  . albuterol (PROVENTIL HFA;VENTOLIN HFA) 108 (90 BASE) MCG/ACT inhaler  Inhale 2 puffs into the lungs every 4 (four) hours as needed for wheezing or shortness of breath. (Patient not taking: Reported on 06/11/2015) 1 Inhaler 0  . diazepam (VALIUM) 5 MG tablet Take 5 mg by mouth every 6 (six) hours as needed for anxiety or muscle spasms.    Marland Kitchen oxyCODONE-acetaminophen (PERCOCET/ROXICET) 5-325 MG tablet Take 1-2 tablets by mouth every 4 (four) hours as needed for moderate pain. (Patient not taking: Reported on 06/11/2015) 60 tablet 0  . predniSONE (DELTASONE) 20 MG tablet Take 2 pills for 3 days, 1 pill for 4 days 10 tablet 0   No current facility-administered medications for this visit.    Objective: BP 130/72 mmHg  Pulse 91  Temp(Src) 99.2 F (37.3 C)  Wt 271 lb (122.925 kg) Gen: NAD, resting comfortably CV: RRR no murmurs rubs or gallops Lungs: CTAB no crackles, wheeze, rhonchi Ext: trace edema Right foot with erythema and swelling at MTP and IP joint on great toe as well as similar findings on 2nd-3rd MTP. Left foot mild erythema and swelling at MTP and IP great toe. On right foot has pain with palpation of noted joitns >> L foot and also pain with movement of joints.  Skin: warm, dry, no rash other than erythema noted above Neuro: grossly normal, moves all extremities, intact distal sensation  Assessment/Plan: Bilateral MTP and IP great toe pain S: Symptoms started in right foot about 2 weeks ago. Has pain in  joint but also has pain distal and proximal to each joint as well. Started in left foot a few days ago. In right foot also has some pani and swelling around MTP 2nd and 3rd toe as well as some erythema on skin simlar to near great toe joints. Pain up to 7/10. No history of recent injury or trauma. No treatments tried. Tries to be careful with unilateral kidney. Symptoms seem to be gradually progressive A/P: Unclear cause- gout vs. Pseudogout vs. Infection vs. Underlying arthritis. Fact bilateral makes infection and gout likely less likely. Would be odd for  spread from one foot to other in regards to infection. I truly do not have lead on differential. Prednisone would likely treat all but infection so 7 day trial planned. If not doing better by Friday, patient will calla nd will try to get into orthopedics- will see me if signs of infection  Return precautions advised.   Meds ordered this encounter  Medications  . predniSONE (DELTASONE) 20 MG tablet    Sig: Take 2 pills for 3 days, 1 pill for 4 days    Dispense:  10 tablet    Refill:  0

## 2015-06-11 NOTE — Telephone Encounter (Signed)
Please call pt and get him on the schedule to come in today per Dr. Yong Channel.

## 2015-06-28 ENCOUNTER — Other Ambulatory Visit: Payer: Self-pay | Admitting: Family Medicine

## 2015-06-28 NOTE — Telephone Encounter (Signed)
I left a detailed message at the pts cell number stating the refill request was forwarded to Dr Yong Channel to address on Tuesday as Dr Maudie Mercury was not able to approve a refill as she did not see this on his current medication list.

## 2015-06-28 NOTE — Telephone Encounter (Signed)
I do not see this medication on his current medication list or noted on recent visits. Would advise to address with Dr. Yong Channel when he returns next week and may need OV.

## 2015-06-30 HISTORY — PX: FINGER SURGERY: SHX640

## 2015-07-02 NOTE — Telephone Encounter (Signed)
Yes thanks, not clear why this fell off med list.

## 2015-07-04 DIAGNOSIS — J209 Acute bronchitis, unspecified: Secondary | ICD-10-CM | POA: Diagnosis not present

## 2015-07-22 ENCOUNTER — Ambulatory Visit: Payer: Medicare Other | Admitting: Family Medicine

## 2015-07-23 DIAGNOSIS — H25012 Cortical age-related cataract, left eye: Secondary | ICD-10-CM | POA: Diagnosis not present

## 2015-07-23 DIAGNOSIS — H2512 Age-related nuclear cataract, left eye: Secondary | ICD-10-CM | POA: Diagnosis not present

## 2015-07-23 DIAGNOSIS — H25812 Combined forms of age-related cataract, left eye: Secondary | ICD-10-CM | POA: Diagnosis not present

## 2015-07-24 ENCOUNTER — Ambulatory Visit: Payer: Medicare Other | Admitting: Family Medicine

## 2015-07-30 ENCOUNTER — Other Ambulatory Visit: Payer: Self-pay | Admitting: Family Medicine

## 2015-07-30 NOTE — Telephone Encounter (Signed)
Yes thanks if prior symptoms in December cleared with this. If this happens again after this round- would like to have him in to reevaluate

## 2015-07-30 NOTE — Telephone Encounter (Signed)
Refill ok? 

## 2015-07-30 NOTE — Telephone Encounter (Signed)
Mediation refilled.

## 2015-08-08 ENCOUNTER — Encounter: Payer: Self-pay | Admitting: Gastroenterology

## 2015-08-13 ENCOUNTER — Encounter: Payer: Self-pay | Admitting: Family Medicine

## 2015-08-13 ENCOUNTER — Ambulatory Visit (INDEPENDENT_AMBULATORY_CARE_PROVIDER_SITE_OTHER): Payer: Medicare Other | Admitting: Family Medicine

## 2015-08-13 VITALS — BP 122/80 | HR 85 | Temp 97.7°F | Wt 277.0 lb

## 2015-08-13 DIAGNOSIS — I10 Essential (primary) hypertension: Secondary | ICD-10-CM | POA: Diagnosis not present

## 2015-08-13 DIAGNOSIS — M79674 Pain in right toe(s): Secondary | ICD-10-CM | POA: Diagnosis not present

## 2015-08-13 DIAGNOSIS — M10079 Idiopathic gout, unspecified ankle and foot: Secondary | ICD-10-CM | POA: Diagnosis not present

## 2015-08-13 DIAGNOSIS — G250 Essential tremor: Secondary | ICD-10-CM | POA: Diagnosis not present

## 2015-08-13 DIAGNOSIS — R21 Rash and other nonspecific skin eruption: Secondary | ICD-10-CM

## 2015-08-13 LAB — BASIC METABOLIC PANEL
BUN: 17 mg/dL (ref 6–23)
CHLORIDE: 99 meq/L (ref 96–112)
CO2: 30 meq/L (ref 19–32)
CREATININE: 1.11 mg/dL (ref 0.40–1.50)
Calcium: 9.5 mg/dL (ref 8.4–10.5)
GFR: 68.99 mL/min (ref >60.00)
GLUCOSE: 144 mg/dL — AB (ref 70–99)
Potassium: 4.3 mEq/L (ref 3.5–5.1)
Sodium: 137 mEq/L (ref 135–145)

## 2015-08-13 LAB — URIC ACID: Uric Acid, Serum: 8.8 mg/dL — ABNORMAL HIGH (ref 4.0–7.8)

## 2015-08-13 MED ORDER — AMLODIPINE BESYLATE 5 MG PO TABS: 5.0000 mg | ORAL_TABLET | Freq: Every day | ORAL | Status: DC

## 2015-08-13 MED ORDER — PROPRANOLOL HCL 10 MG PO TABS: ORAL_TABLET | ORAL | Status: DC

## 2015-08-13 MED ORDER — TRIAMCINOLONE ACETONIDE 0.1 % EX CREA: 1.0000 "application " | TOPICAL_CREAM | Freq: Two times a day (BID) | CUTANEOUS | Status: DC

## 2015-08-13 NOTE — Patient Instructions (Addendum)
Check labs before you leave  Wt Readings from Last 3 Encounters:  08/13/15 277 lb (125.646 kg)  06/11/15 271 lb (122.925 kg)  04/17/15 260 lb 6.4 oz (118.117 kg)  Prednisone and neck and eye surgery didn't help- we need to reverse this trend  Follow up with [facility]  May trial cream for left arm- use 7 days in a row twice a day. DO NOT SCRATCH area. May repeat in future but need to take week off  Sent in propranolol at low dose.   Happy to see you sooner if needed

## 2015-08-13 NOTE — Progress Notes (Signed)
[name], MD  Subjective:  Wayne Booth is a 75 y.o. year old very pleasant male patient who presents for/with See problem oriented charting ROS- no fever, chills, nausea. No chest pain or shortness of breath. Rash on left arm.   Past Medical History-  Patient Active Problem List   Diagnosis Date Noted  . 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 Renal cell carcinoma ([place]) 12/26/2007    Priority: High  . Essential tremor 08/14/2015    Priority: Medium  . Gout 08/14/2015    Priority: Medium  . Hypothyroidism 07/12/2014    Priority: Medium  . Hyperglycemia 07/12/2014    Priority: Medium  . GAD (generalized anxiety disorder) 11/07/2013    Priority: Medium  . OSA (obstructive sleep apnea) 07/07/2011    Priority: Medium  . Morbid obesity ([place]) 10/02/2008    Priority: Medium  . Essential hypertension 12/28/2006    Priority: Medium  . Bronchospasm 06/09/2011    Priority: Low  . Actinic keratosis 11/27/2009    Priority: Low  . Osteoarthritis 11/13/2008    Priority: Low  . TESTOSTERONE DEFICIENCY 12/28/2006    Priority: Low  . Status post cervical discectomy 04/24/2015    Medications- reviewed and updated Current Outpatient Prescriptions  Medication Sig Dispense Refill  . aspirin EC 81 MG tablet Take 81 mg by mouth daily.    Marland Kitchen escitalopram (LEXAPRO) 20 MG tablet Take 1 tablet (20 mg total) by mouth daily. TAKE 20 mg every day 90 tablet 3  . levothyroxine (SYNTHROID, LEVOTHROID) 100 MCG tablet Take 100 mcg by mouth daily before breakfast.     . losartan (COZAAR) 100 MG tablet Take 1 tablet (100 mg total) by mouth daily. 90 tablet 3  . Multiple Vitamins-Minerals (MULTIVITAMINS THER. W/MINERALS) TABS Take 1 tablet by mouth daily.    . nortriptyline (PAMELOR) 10 MG capsule TAKE 1 CAPSULE (10 MG TOTAL) BY MOUTH AT BEDTIME. 90 capsule 2  . albuterol (PROVENTIL HFA;VENTOLIN HFA) 108 (90 BASE) MCG/ACT inhaler Inhale 2 puffs into the lungs every 4 (four) hours  as needed for wheezing or shortness of breath. (Patient not taking: Reported on 06/11/2015) 1 Inhaler 0  . amLODipine (NORVASC) 5 MG tablet Take 1 tablet (5 mg total) by mouth daily. 90 tablet 3  . diazepam (VALIUM) 5 MG tablet Take 5 mg by mouth every 6 (six) hours as needed for anxiety or muscle spasms. Reported on 08/13/2015    . propranolol (INDERAL) 10 MG tablet TAKE 1 TABLET (10 MG TOTAL) BY MOUTH 2 (TWO) TIMES DAILY. 60 tablet 5  . traZODone (DESYREL) 50 MG tablet TAKE 0.5-1 TABLETS (25-50 MG TOTAL) BY MOUTH AT BEDTIME AS NEEDED FORSLEEP. (Patient not taking: Reported on 08/13/2015) 90 tablet 3  . triamcinolone cream (KENALOG) 0.1 % Apply 1 application topically 2 (two) times daily. For 1 week max before at least a week off- may retrial after that 80 g 0   No current facility-administered medications for this visit.    Objective: BP 122/80 mmHg  Pulse 85  Temp(Src) 97.7 F (36.5 C)  Wt 277 lb (125.646 kg) Gen: NAD, resting comfortably CV: RRR no murmurs rubs or gallops Lungs: CTAB no crackles, wheeze, rhonchi Abdomen: soft/nontender/nondistended/normal bowel sounds. No rebound or guarding.  Ext: no edema Skin: warm, dry, excoriated erythematous papules on left forearm Neuro: grossly normal, moves all extremities, fine intention tremor noted on bilateral hands  Assessment/Plan:  Essential tremor S: present for several years. Used to be on Propranolol  10mg  BID. He stopped taking it a few years ago and noted worsening of symptoms- wants to restart A/P: restart Propranolol 10mg  BID. May need to titrate up but apparently worked at this low dose in past. If get up higher- consider transition to once a day long acting.    Gout S: over past 6 months 2 episodes of bilateral MTP and IP toe pain. Both responded to prednisone A/P: uric acid elevated at 8.8. Is on HCTZ which can induce gout. We discussed allpurinol/colchicine but patient would like to trial off hctz first- we will do this  and follow up 2-3 months as also changing bp meds   Essential hypertension S: controlled on losartan 100mg , hctz 25mg  BP Readings from Last 3 Encounters:  08/13/15 122/80  06/11/15 130/72  04/26/15 135/82  A/P:Continue losartan 100mg . Start amlodipine 5mg . Stop hctz due to gout. Follow up 2-3 months.    Rash S:Over 6 months, left arm. Erythematous excoriated papules from his continued scratching.  A/P:Triamcinolone trial. Unclear etiology but is definitely in itch/scratch cycle now and will attempt to abort this.    Return in about 3 months (around 11/13/2015) for physical (or later). Return precautions advised. Encouraged to follow up with [facility]. Encouraged reversal of weight trend as up 17 lbs over last 4-5 months. Physical after may 16th.   Orders Placed This Encounter  Procedures  . Uric Acid  . Basic metabolic panel    [place]    Meds ordered this encounter  Medications  . propranolol (INDERAL) 10 MG tablet    Sig: TAKE 1 TABLET (10 MG TOTAL) BY MOUTH 2 (TWO) TIMES DAILY.    Dispense:  60 tablet    Refill:  5  . triamcinolone cream (KENALOG) 0.1 %    Sig: Apply 1 application topically 2 (two) times daily. For 1 week max before at least a week off- may retrial after that    Dispense:  80 g    Refill:  0  . amLODipine (NORVASC) 5 MG tablet    Sig: Take 1 tablet (5 mg total) by mouth daily.    Dispense:  90 tablet    Refill:  3

## 2015-08-14 DIAGNOSIS — M109 Gout, unspecified: Secondary | ICD-10-CM | POA: Insufficient documentation

## 2015-08-14 DIAGNOSIS — G250 Essential tremor: Secondary | ICD-10-CM | POA: Insufficient documentation

## 2015-08-14 HISTORY — DX: Gout, unspecified: M10.9

## 2015-08-14 NOTE — Assessment & Plan Note (Signed)
S: over past 6 months 2 episodes of bilateral MTP and IP toe pain. Both responded to prednisone A/P: uric acid elevated at 8.8. Is on HCTZ which can induce gout. We discussed allpurinol/colchicine but patient would like to trial off hctz first- we will do this and follow up 2-3 months as also changing bp meds

## 2015-08-14 NOTE — Assessment & Plan Note (Signed)
S: present for several years. Used to be on Propranolol 10mg  BID. He stopped taking it a few years ago and noted worsening of symptoms- wants to restart A/P: restart Propranolol 10mg  BID. May need to titrate up but apparently worked at this low dose in past. If get up higher- consider transition to once a day long acting.

## 2015-08-14 NOTE — Assessment & Plan Note (Signed)
S: controlled on losartan 100mg , hctz 25mg  BP Readings from Last 3 Encounters:  08/13/15 122/80  06/11/15 130/72  04/26/15 135/82  A/P:Continue losartan 100mg . Start amlodipine 5mg . Stop hctz due to gout. Follow up 2-3 months.

## 2015-08-26 DIAGNOSIS — Z961 Presence of intraocular lens: Secondary | ICD-10-CM | POA: Diagnosis not present

## 2015-09-02 DIAGNOSIS — M5412 Radiculopathy, cervical region: Secondary | ICD-10-CM | POA: Diagnosis not present

## 2015-09-16 ENCOUNTER — Telehealth: Payer: Self-pay | Admitting: Family Medicine

## 2015-09-16 NOTE — Telephone Encounter (Signed)
Pt would like to know why he is taking 2 bp med

## 2015-09-16 NOTE — Telephone Encounter (Signed)
See below

## 2015-09-16 NOTE — Telephone Encounter (Signed)
To help control his blood pressure. He had been on lisinopril and hctz but due to other medical issues changed to lisinopril and amlodipine.

## 2015-09-17 NOTE — Telephone Encounter (Signed)
Please schedule pt OV to discuss meds per Dr. Yong Channel

## 2015-09-17 NOTE — Telephone Encounter (Signed)
Pt states he is not having any problems with his BP. He  States you were supposed to be giving him a medicine for gout and the tremble that he is having but instead you gave him more BP meds. He states you have him on 3 different BP meds and he is very confused as to why when he is not having BP problems.

## 2015-09-17 NOTE — Telephone Encounter (Signed)
He can come for visit if he needs to talk more

## 2015-09-17 NOTE — Telephone Encounter (Signed)
FYI:I read pt what you put in your note regarding the meds but he was still confused.

## 2015-09-17 NOTE — Telephone Encounter (Signed)
lmom for pt to call back

## 2015-09-18 ENCOUNTER — Encounter: Payer: Self-pay | Admitting: Family Medicine

## 2015-09-18 ENCOUNTER — Ambulatory Visit (INDEPENDENT_AMBULATORY_CARE_PROVIDER_SITE_OTHER): Payer: Medicare Other | Admitting: Family Medicine

## 2015-09-18 VITALS — BP 112/64 | HR 91 | Temp 99.1°F | Wt 276.0 lb

## 2015-09-18 DIAGNOSIS — I10 Essential (primary) hypertension: Secondary | ICD-10-CM | POA: Diagnosis not present

## 2015-09-18 NOTE — Telephone Encounter (Signed)
Pt has been sch

## 2015-09-18 NOTE — Assessment & Plan Note (Signed)
S: controlled on amlodipine 5mg  and losartan 100mg . We had stopped hctz due to gout previously. Also takes propranolol for essential tremor. Patient had talked with pharmacist and was alarmed to be on 3 blood pressure medicines. He had felt sluggish for a few days but now improved. He thought he was given amlodipine for gout and didn't realize it was replacment for hcts BP Readings from Last 3 Encounters:  09/18/15 112/64  08/13/15 122/80  06/11/15 130/72  A/P: blood pressure looks excellent on all 3 meds. We will trial off amlodipine at 5mg  for 1 week swith follow up nurse visit in 1 week. If remains <140/90 continue 2 meds alone (propranolol and cozaar). If above goal may add amlodipine back in.

## 2015-09-18 NOTE — Progress Notes (Signed)
[name], MD  Subjective:  Wayne Booth is a 75 y.o. year old very pleasant male patient who presents for/with See problem oriented charting ROS- No chest pain or shortness of breath. No headache or blurry vision.   Past Medical History-  Patient Active Problem List   Diagnosis Date Noted  . 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 Renal cell carcinoma ([place]) 12/26/2007    Priority: High  . Essential tremor 08/14/2015    Priority: Medium  . Gout 08/14/2015    Priority: Medium  . Hypothyroidism 07/12/2014    Priority: Medium  . Hyperglycemia 07/12/2014    Priority: Medium  . GAD (generalized anxiety disorder) 11/07/2013    Priority: Medium  . OSA (obstructive sleep apnea) 07/07/2011    Priority: Medium  . Morbid obesity ([place]) 10/02/2008    Priority: Medium  . Essential hypertension 12/28/2006    Priority: Medium  . Bronchospasm 06/09/2011    Priority: Low  . Actinic keratosis 11/27/2009    Priority: Low  . Osteoarthritis 11/13/2008    Priority: Low  . TESTOSTERONE DEFICIENCY 12/28/2006    Priority: Low  . Status post cervical discectomy 04/24/2015    Medications- reviewed and updated Current Outpatient Prescriptions  Medication Sig Dispense Refill  . amLODipine (NORVASC) 5 MG tablet Take 1 tablet (5 mg total) by mouth daily. 90 tablet 3  . aspirin EC 81 MG tablet Take 81 mg by mouth daily.    Marland Kitchen escitalopram (LEXAPRO) 20 MG tablet Take 1 tablet (20 mg total) by mouth daily. TAKE 20 mg every day 90 tablet 3  . levothyroxine (SYNTHROID, LEVOTHROID) 100 MCG tablet Take 100 mcg by mouth daily before breakfast.     . losartan (COZAAR) 100 MG tablet Take 1 tablet (100 mg total) by mouth daily. 90 tablet 3  . Multiple Vitamins-Minerals (MULTIVITAMINS THER. W/MINERALS) TABS Take 1 tablet by mouth daily.    . nortriptyline (PAMELOR) 10 MG capsule TAKE 1 CAPSULE (10 MG TOTAL) BY MOUTH AT BEDTIME. 90 capsule 2  . propranolol (INDERAL) 10 MG tablet TAKE 1  TABLET (10 MG TOTAL) BY MOUTH 2 (TWO) TIMES DAILY. 60 tablet 5  . triamcinolone cream (KENALOG) 0.1 % Apply 1 application topically 2 (two) times daily. For 1 week max before at least a week off- may retrial after that 80 g 0  . albuterol (PROVENTIL HFA;VENTOLIN HFA) 108 (90 BASE) MCG/ACT inhaler Inhale 2 puffs into the lungs every 4 (four) hours as needed for wheezing or shortness of breath. (Patient not taking: Reported on 06/11/2015) 1 Inhaler 0  . diazepam (VALIUM) 5 MG tablet Take 5 mg by mouth every 6 (six) hours as needed for anxiety or muscle spasms. Reported on 09/18/2015    . traZODone (DESYREL) 50 MG tablet TAKE 0.5-1 TABLETS (25-50 MG TOTAL) BY MOUTH AT BEDTIME AS NEEDED FORSLEEP. (Patient not taking: Reported on 08/13/2015) 90 tablet 3   No current facility-administered medications for this visit.    Objective: BP 112/64 mmHg  Pulse 91  Temp(Src) 99.1 F (37.3 C)  Wt 276 lb (125.193 kg) Gen: NAD, resting comfortably CV: RRR no murmurs rubs or gallops Lungs: CTAB no crackles, wheeze, rhonchi Abdomen: soft/nontender/nondistended/normal bowel sounds. No rebound or guarding. obese Ext: no edema Skin: warm, dry, no rash  Assessment/Plan:  Essential hypertension S: controlled on amlodipine 5mg  and losartan 100mg . We had stopped hctz due to gout previously. Also takes propranolol for essential tremor. Patient had talked with pharmacist and was alarmed  to be on 3 blood pressure medicines. He had felt sluggish for a few days but now improved. He thought he was given amlodipine for gout and didn't realize it was replacment for hcts BP Readings from Last 3 Encounters:  09/18/15 112/64  08/13/15 122/80  06/11/15 130/72  A/P: blood pressure looks excellent on all 3 meds. We will trial off amlodipine at 5mg  for 1 week swith follow up nurse visit in 1 week. If remains <140/90 continue 2 meds alone (propranolol and cozaar). If above goal may add amlodipine back in.   Also may have repeat  neck surgery. Able to do 4 mets without chest pain or shortness of breath  Nurse visit 1 week. Did not yet remove amlodipine from list but instructed to hold  The duration of face-to-face time during this visit was 15 minutes. Greater than 50% of this time was spent in counseling, explanation of diagnosis, planning of further management, and/or coordination of care.

## 2015-09-18 NOTE — Patient Instructions (Signed)
Hold amlodipine- do not take over the next week  Continue cozaar for blood pressure Continue propranolol which is primarily for your tremor but also works some on blood pressure  See Keba back for a nurse visit next Wednesday  Dont leave before we talk- if your pressure is <140/90 we will permanently discontinue the amlodipine

## 2015-09-25 ENCOUNTER — Encounter: Payer: Self-pay | Admitting: Family Medicine

## 2015-09-25 ENCOUNTER — Ambulatory Visit (INDEPENDENT_AMBULATORY_CARE_PROVIDER_SITE_OTHER): Payer: Medicare Other | Admitting: Family Medicine

## 2015-09-25 VITALS — BP 100/64

## 2015-09-25 DIAGNOSIS — I10 Essential (primary) hypertension: Secondary | ICD-10-CM

## 2015-10-14 ENCOUNTER — Other Ambulatory Visit: Payer: Self-pay

## 2015-10-15 MED ORDER — NORTRIPTYLINE HCL 10 MG PO CAPS: ORAL_CAPSULE | ORAL | Status: DC

## 2015-11-07 ENCOUNTER — Other Ambulatory Visit (INDEPENDENT_AMBULATORY_CARE_PROVIDER_SITE_OTHER): Payer: Medicare Other

## 2015-11-07 DIAGNOSIS — Z0000 Encounter for general adult medical examination without abnormal findings: Secondary | ICD-10-CM

## 2015-11-07 LAB — HEPATIC FUNCTION PANEL
ALBUMIN: 4.1 g/dL (ref 3.5–5.2)
ALK PHOS: 69 U/L (ref 39–117)
ALT: 28 U/L (ref 0–53)
AST: 17 U/L (ref 0–37)
Bilirubin, Direct: 0.1 mg/dL (ref 0.0–0.3)
TOTAL PROTEIN: 7.1 g/dL (ref 6.0–8.3)
Total Bilirubin: 0.5 mg/dL (ref 0.2–1.2)

## 2015-11-07 LAB — POC URINALSYSI DIPSTICK (AUTOMATED)
Bilirubin, UA: NEGATIVE
GLUCOSE UA: NEGATIVE
Ketones, UA: NEGATIVE
Leukocytes, UA: NEGATIVE
Nitrite, UA: NEGATIVE
Protein, UA: NEGATIVE
RBC UA: NEGATIVE
SPEC GRAV UA: 1.025
UROBILINOGEN UA: 1
pH, UA: 5.5

## 2015-11-07 LAB — CBC WITH DIFFERENTIAL/PLATELET
Basophils Absolute: 0.1 10*3/uL (ref 0.0–0.1)
Basophils Relative: 0.7 % (ref 0.0–3.0)
EOS PCT: 3.3 % (ref 0.0–5.0)
Eosinophils Absolute: 0.3 10*3/uL (ref 0.0–0.7)
HEMATOCRIT: 50.7 % (ref 39.0–52.0)
Hemoglobin: 17.5 g/dL — ABNORMAL HIGH (ref 13.0–17.0)
LYMPHS ABS: 2.9 10*3/uL (ref 0.7–4.0)
LYMPHS PCT: 30 % (ref 12.0–46.0)
MCHC: 34.5 g/dL (ref 30.0–36.0)
MCV: 90.6 fl (ref 78.0–100.0)
MONOS PCT: 10.5 % (ref 3.0–12.0)
Monocytes Absolute: 1 10*3/uL (ref 0.1–1.0)
NEUTROS ABS: 5.4 10*3/uL (ref 1.4–7.7)
NEUTROS PCT: 55.5 % (ref 43.0–77.0)
PLATELETS: 316 10*3/uL (ref 150.0–400.0)
RBC: 5.59 Mil/uL (ref 4.22–5.81)
RDW: 14.6 % (ref 11.5–15.5)
WBC: 9.7 10*3/uL (ref 4.0–10.5)

## 2015-11-07 LAB — BASIC METABOLIC PANEL
BUN: 21 mg/dL (ref 6–23)
CO2: 32 meq/L (ref 19–32)
Calcium: 9.5 mg/dL (ref 8.4–10.5)
Chloride: 100 mEq/L (ref 96–112)
Creatinine, Ser: 1.22 mg/dL (ref 0.40–1.50)
GFR: 61.82 mL/min (ref >60.00)
GLUCOSE: 116 mg/dL — AB (ref 70–99)
POTASSIUM: 4.8 meq/L (ref 3.5–5.1)
SODIUM: 137 meq/L (ref 135–145)

## 2015-11-07 LAB — LIPID PANEL
CHOL/HDL RATIO: 5
Cholesterol: 169 mg/dL (ref 0–200)
HDL: 33.3 mg/dL — AB (ref >39.00)
LDL CALC: 98 mg/dL (ref 0–99)
NONHDL: 136.04
Triglycerides: 189 mg/dL — ABNORMAL HIGH (ref 0.0–149.0)
VLDL: 37.8 mg/dL (ref 0.0–40.0)

## 2015-11-07 LAB — TSH: TSH: 0.32 u[IU]/mL — AB (ref 0.35–4.50)

## 2015-11-07 LAB — PSA: PSA: 0 ng/mL — ABNORMAL LOW (ref 0.10–4.00)

## 2015-11-13 ENCOUNTER — Encounter: Payer: Self-pay | Admitting: Family Medicine

## 2015-11-13 ENCOUNTER — Ambulatory Visit (INDEPENDENT_AMBULATORY_CARE_PROVIDER_SITE_OTHER): Payer: Medicare Other | Admitting: Family Medicine

## 2015-11-13 VITALS — BP 100/68 | HR 76 | Temp 98.0°F | Ht 68.5 in | Wt 278.0 lb

## 2015-11-13 DIAGNOSIS — M5090 Cervical disc disorder, unspecified, unspecified cervical region: Secondary | ICD-10-CM

## 2015-11-13 DIAGNOSIS — I10 Essential (primary) hypertension: Secondary | ICD-10-CM

## 2015-11-13 DIAGNOSIS — E890 Postprocedural hypothyroidism: Secondary | ICD-10-CM

## 2015-11-13 DIAGNOSIS — Z0001 Encounter for general adult medical examination with abnormal findings: Secondary | ICD-10-CM

## 2015-11-13 DIAGNOSIS — R6889 Other general symptoms and signs: Secondary | ICD-10-CM

## 2015-11-13 DIAGNOSIS — M5030 Other cervical disc degeneration, unspecified cervical region: Secondary | ICD-10-CM | POA: Insufficient documentation

## 2015-11-13 NOTE — Assessment & Plan Note (Signed)
S/p cervical discectomy. Still with some pain after- nortriptyline 10mg  bumped to 75mg  by neurosurgery has resolved pain. Warned of serotonin syndrome risk.

## 2015-11-13 NOTE — Patient Instructions (Addendum)
So biggest thing today is the need for weight loss. You are at increased risk for diabetes with sugar of 116 and when you hit 126 it is diabetes so we do not want that for you.   Minimize white breads, pastas, white rice. Cut out any beverage that isn't water. Increase veggies to 3-5 servings a day. Goal weigh tloss by follow up of at least 10 lbs. Restart exercise.   Cut your hydrochlorothiazide in half since blood pressure has been on the low side and that medication can increase gout risk  Schedule follow up with Dr. Murlean Iba no recent gout flares- if recur we need to consider testing gout levels or uric acid levels and consider medication  Hemoglobin is high- restart your CPAP and cut out smoking- recheck at follow up   Serotonin Syndrome- Can occur on nortriptyline and lexapro. I would avoid trmadol.  Serotonin is a brain chemical that regulates the nervous system, which includes the brain, spinal cord, and nerves. Serotonin appears to play a role in all types of behavior, including appetite, emotions, movement, thinking, and response to stress. Excessively high levels of serotonin in the body can cause serotonin syndrome, which is a very dangerous condition. CAUSES This condition can be caused by taking medicines or drugs that increase the level of serotonin in your body. These include:  Antidepressant medicines.  Migraine medicines.  Certain pain medicines.  Certain recreational drugs, including ecstasy, LSD, cocaine, and amphetamines.  Over-the-counter cough or cold medicines that contain dextromethorphan.  Certain herbal supplements, including St. John's wort, ginseng, and nutmeg. This condition usually occurs when you take these medicines or drugs in combination, but it can also happen with a high dose of a single medicine or drug. RISK FACTORS This condition is more likely to develop in:  People who have recently increased the dosage of medicine that increases the  serotonin level.  People who just started taking medicine that increases the serotonin level. SYMPTOMS Symptoms of this condition usually happens within several hours of a medicine change. Symptoms include:  Headache.  Muscle twitching or stiffness.  Diarrhea.  Confusion.  Restlessness or agitation.  Shivering or goose bumps.  Loss of muscle coordination.  Rapid heart rate.  Sweating. Severe cases of serotonin syndromecan cause:  Irregular heartbeat.  Seizures.  Loss of consciousness.  High fever. DIAGNOSIS This condition is diagnosed with a medical history and physical exam. You will be asked aboutyour symptoms and your use of medicines and recreational drugs. Your health care provider may also order lab work or additional tests to rule out other causes of your symptoms. TREATMENT The treatment for this condition depends on the severity of your symptoms. For mild cases, stopping the medicine that caused your condition is usually all that is needed. For moderate to severe cases, hospitalization is required to monitor you and to prevent further muscle damage. HOME CARE INSTRUCTIONS  Take over-the-counter and prescription medicines only as told by your health care provider. This is important.  Check with your health care provider before you start taking any new prescriptions, over-the-counter medicines, herbs, or supplements.  Avoid combining any medicines that can cause this condition to occur.  Keep all follow-up visits as told by your health care provider.This is important.  Maintain a healthy lifestyle.  Eat healthy foods.  Get plenty of sleep.  Exercise regularly.  Do not drink alcohol.  Do not use recreational drugs. SEEK MEDICAL CARE IF:  Medicines do not seem to be helping.  Your symptoms do not improve or they get worse.  You have trouble taking care of yourself. SEEK IMMEDIATE MEDICAL CARE IF:  You have worsening confusion, severe headache,  chest pain, high fever, seizures, or loss of consciousness.  You have serious thoughts about hurting yourself or others.  You experience serious side effects of medicine, such as swelling of your face, lips, tongue, or throat.   This information is not intended to replace advice given to you by your health care provider. Make sure you discuss any questions you have with your health care provider.   Document Released: 07/23/2004 Document Revised: 10/30/2014 Document Reviewed: 06/28/2014 Elsevier Interactive Patient Education [employer].

## 2015-11-13 NOTE — Assessment & Plan Note (Signed)
Well controlled on Losartan 100mg , propranolol 10mg  BID (mainly for essential tremor), amlodipine 5mg , hctz 25mg . I am not sure how patient restarted his HCTZ as we had stopped this in the past. We are going to cut to half dose and follow up in 6 months- suspect he will be able to come off completely at follow up.

## 2015-11-13 NOTE — Assessment & Plan Note (Signed)
10 lbs down goal by follow up. Encouraged need for healthy eating, regular exercise, weight loss.

## 2015-11-13 NOTE — Progress Notes (Signed)
Phone: [phone]  Subjective:  Patient presents today for their annual physical. Chief complaint-noted.   See problem oriented charting- ROS- full  review of systems was completed and negative including no recent gout flares. No chest pain or shortness of breath. No headache or blurry vision.   The following were reviewed and entered/updated in epic: Past Medical History  Diagnosis Date  . History of total knee replacement   . Hypertension   . Anxiety   . Pneumonia     hx of  . Arthritis   . Pituitary cyst ([place])   . Kidney stone   . Hypothyroidism   . Cancer ([place])   . Prostate cancer ([place])   . Kidney cancer, primary, with metastasis from kidney to other site [facility])   . OSA (obstructive sleep apnea)     pt does not wear cpap at night   Patient Active Problem List   Diagnosis Date Noted  . 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 Renal cell carcinoma ([place]) 12/26/2007    Priority: High  . Cervical disc disease 11/13/2015    Priority: Medium  . Essential tremor 08/14/2015    Priority: Medium  . Gout 08/14/2015    Priority: Medium  . Hypothyroidism 07/12/2014    Priority: Medium  . Hyperglycemia 07/12/2014    Priority: Medium  . GAD (generalized anxiety disorder) 11/07/2013    Priority: Medium  . OSA (obstructive sleep apnea) 07/07/2011    Priority: Medium  . Morbid obesity ([place]) 10/02/2008    Priority: Medium  . Essential hypertension 12/28/2006    Priority: Medium  . Bronchospasm 06/09/2011    Priority: Low  . Actinic keratosis 11/27/2009    Priority: Low  . Osteoarthritis 11/13/2008    Priority: Low  . TESTOSTERONE DEFICIENCY 12/28/2006    Priority: Low   Past Surgical History  Procedure Laterality Date  . Appendectomy    . Cholecystectomy    . Nephrectomy Left   . Insertion prostate radiation seed    . Replacement total knee bilateral    . Knee arthroscopy  2007    left  . Anterior cervical decomp/discectomy fusion N/A 08/29/2012    Procedure: ANTERIOR CERVICAL DECOMPRESSION/DISCECTOMY FUSION 1 LEVEL;  Surgeon: Eustace Moore, MD;  Location: [place] [facility];  Service: Neurosurgery;  Laterality: N/A;  . Craniotomy N/A 11/08/2012    Procedure: CRANIOTOMY HYPOPHYSECTOMY TRANSNASAL APPROACH;  Surgeon: Faythe Ghee, MD;  Location: [place] [facility]UR[facility] OR[facility];  Service: Neurosurgery;  Laterality: N/A;  Transphenoidal Resection of Pituitary Tumor  . Colonoscopy w/ polypectomy    . Kidney stone surgery    . Cardiac catheterization      no PCI  . Posterior cervical laminectomy Left 04/24/2015    Procedure: Foraminotomy cervical five - cervical six cervical six - cervical seven left;  Surgeon: Eustace Moore, MD;  Location: [facility];  Service: Neurosurgery;  Laterality: Left;    Family History  Problem Relation Age of Onset  . Cancer Father     ?mouth, died before patient born  . Cancer Mother     stomach, died when he was young  . Kidney cancer Sister   . Colon cancer Brother   . Esophageal cancer Neg Hx   . Rectal cancer Neg Hx   . Stomach cancer Neg Hx     Medications- reviewed and updated Current Outpatient Prescriptions  Medication Sig Dispense Refill  . amLODipine (NORVASC) 5 MG tablet Take 1 tablet (5 mg total) by mouth  daily. 90 tablet 3  . aspirin EC 81 MG tablet Take 81 mg by mouth daily.    . diazepam (VALIUM) 5 MG tablet Take 5 mg by mouth every 6 (six) hours as needed for anxiety or muscle spasms. Reported on 09/18/2015    . escitalopram (LEXAPRO) 20 MG tablet Take 1 tablet (20 mg total) by mouth daily. TAKE 20 mg every day 90 tablet 3  . hydrochlorothiazide (HYDRODIURIL) 25 MG tablet 25 mg daily.    Marland Kitchen levothyroxine (SYNTHROID, LEVOTHROID) 100 MCG tablet Take 100 mcg by mouth daily before breakfast.     . levothyroxine (SYNTHROID, LEVOTHROID) 150 MCG tablet 150 mcg daily.    Marland Kitchen losartan (COZAAR) 100 MG tablet Take 1 tablet (100 mg total) by mouth daily. 90 tablet 3  . Multiple Vitamins-Minerals (MULTIVITAMINS THER.  W/MINERALS) TABS Take 1 tablet by mouth daily.    . nortriptyline (PAMELOR) 10 MG capsule TAKE 1 CAPSULE (10 MG TOTAL) BY MOUTH AT BEDTIME. 90 capsule 2  . nortriptyline (PAMELOR) 75 MG capsule 75 mg at bedtime.    . propranolol (INDERAL) 10 MG tablet TAKE 1 TABLET (10 MG TOTAL) BY MOUTH 2 (TWO) TIMES DAILY. 60 tablet 5  . traZODone (DESYREL) 50 MG tablet TAKE 0.5-1 TABLETS (25-50 MG TOTAL) BY MOUTH AT BEDTIME AS NEEDED FORSLEEP. (Patient not taking: Reported on 08/13/2015) 90 tablet 3  . triamcinolone cream (KENALOG) 0.1 % Apply 1 application topically 2 (two) times daily. For 1 week max before at least a week off- may retrial after that 80 g 0   No current facility-administered medications for this visit.    Allergies-reviewed and updated No Known Allergies  Social History   Social History  . Marital Status: Married    Spouse Name: [name]  . Number of Children: Y  . Years of Education: N/A   Occupational History  . retired Buyer, retail    Social History Main Topics  . Smoking status: Current Some Day Smoker -- 4 years    Types: Cigars  . Smokeless tobacco: Never Used     Comment: 1 cigar per month, with golf  . Alcohol Use: 1.2 oz/week    2 Shots of liquor per week     Comment: socially  . Drug Use: No  . Sexual Activity: Not Asked   Other Topics Concern  . None   Social History Narrative   Married 1965 (wife patient of Dr. Megan Salon). 2 sons. 4 granddaughters.       Retired Chemical engineer.       Hobbies: golf, fishing, formerly hunting.     Objective: BP 100/68 mmHg  Pulse 76  Temp(Src) 98 F (36.7 C) (Oral)  Ht 5' 8.5" (1.74 m)  Wt 278 lb (126.1 kg)  BMI 41.65 kg/m2 Gen: NAD, resting comfortably No thyromegaly HEENT: Mucous membranes are moist. Oropharynx normal Neck: no thyromegaly CV: RRR no murmurs rubs or gallops Lungs: CTAB no crackles, wheeze, rhonchi Abdomen: soft/nontender/nondistended/normal bowel sounds. No rebound or  guarding.  Ext: trace edema Skin: warm, dry Neuro: grossly normal, moves all extremities, PERRLA  Assessment/Plan:  75 y.o. male presenting for annual physical.  Health Maintenance counseling: 1. Anticipatory guidance: Patient counseled regarding regular dental exams, eye exams, wearing seatbelts.  2. Risk factor reduction:  Advised patient of need for regular exercise and diet rich and fruits and vegetables to reduce risk of heart attack and stroke.  3. Immunizations/screenings/ancillary studies- up to date Immunization History  Administered Date(s) Administered  . Influenza  Split 04/01/2011, 03/29/2012  . Influenza Whole 04/12/2007, 03/18/2009, 03/04/2010  . Influenza, High Dose Seasonal PF 04/03/2014, 04/04/2015  . Influenza,inj,Quad PF,36+ Mos 03/22/2013  . Pneumococcal Conjugate-13 11/12/2014  . Pneumococcal Polysaccharide-23 06/09/2011  . Td 06/29/1994  . Tdap 06/09/2011  . Zoster 09/22/2007   4. Prostate cancer screening- history of prostate cancer with seed implants. PSA at 0. Follows with Dr. Alinda Money  Lab Results  Component Value Date   PSA 0.00* 11/07/2015   PSA 0.76 09/09/2007   PSA 0.88 09/07/2006   5. Colon cancer screening - 10/16/13 with 3 year follow up- will be next year 6. Skin cancer screening- Dr. Ubaldo Glassing visits if concerns  Status of chronic or acute concerns   1. Prostate cancer history- PSA at 0 after seed implants. Renal cell carcinoma- sees urology annually after nephrectomy 2009  2. Obesity- as always counseled on weight loss- weight going up. Only minor walking.  Admits he loves to eat and has bread with every meal. Advised cut diet drinks and grapefruit juice.   3. Hypertension- well controlled on losartan 100mg  and propranolol 10mg  BID and amlodipine 5mg  and hctz 25mg . Start taking 1/2 pill of hctz given lower blood pressures ang gout risk BP Readings from Last 3 Encounters:  11/13/15 100/68  09/25/15 100/64  09/18/15 112/64   4. GAD- stable on  lexapro and trazodone Depression screen [facility] 2/9 11/13/2015 08/13/2015 07/12/2014 07/11/2013  Decreased Interest 0 0 0 0  Down, Depressed, Hopeless 0 0 0 0  PHQ - 2 Score 0 0 0 0   5. 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 Stable on levothyroxine 150mg . TSH slightly low but this is central hypothyroidism so T4 would be needed and that is measured by endocrine. Has not seen recently- he is going to call to get reestablished  6. Hyperglycemia- needs weight loss as discussed.   7. Essential Tremor- controlled on propranolol 10mg  BID  8. Gout- no recent flares. Was started on hctz 25 mg in past- we will titrate to 12.5mg  and then stop it at next visit if blood pressure still good  9 Hemoglobin high- advised smoking cessation and restart CPAP which he has not been using.  Advised no smoking whatsoever- still does cigars  Morbid obesity 10 lbs down goal by follow up. Encouraged need for healthy eating, regular exercise, weight loss.   Essential hypertension Well controlled on Losartan 100mg , propranolol 10mg  BID (mainly for essential tremor), amlodipine 5mg , hctz 25mg . I am not sure how patient restarted his HCTZ as we had stopped this in the past. We are going to cut to half dose and follow up in 6 months- suspect he will be able to come off completely at follow up.   Cervical disc disease S/p cervical discectomy. Still with some pain after- nortriptyline 10mg  bumped to 75mg  by neurosurgery has resolved pain. Warned of serotonin syndrome risk.    Return in about 6 months (around 05/15/2016) for follow up- or sooner if needed. Return precautions advised.    Meds ordered this encounter  Medications  . hydrochlorothiazide (HYDRODIURIL) 25 MG tablet    Sig: 25 mg daily.  Marland Kitchen levothyroxine (SYNTHROID, LEVOTHROID) 150 MCG tablet    Sig: 150 mcg daily.  . nortriptyline (PAMELOR) 75 MG capsule    Sig: 75 mg at bedtime.    [name], MD

## 2015-11-18 ENCOUNTER — Other Ambulatory Visit: Payer: Self-pay

## 2015-11-18 MED ORDER — LOSARTAN POTASSIUM 100 MG PO TABS: 100.0000 mg | ORAL_TABLET | Freq: Every day | ORAL | Status: DC

## 2016-01-28 ENCOUNTER — Other Ambulatory Visit: Payer: Self-pay | Admitting: Family Medicine

## 2016-01-29 ENCOUNTER — Other Ambulatory Visit: Payer: Self-pay

## 2016-01-29 ENCOUNTER — Telehealth: Payer: Self-pay | Admitting: Family Medicine

## 2016-01-29 MED ORDER — ESCITALOPRAM OXALATE 20 MG PO TABS: 20.0000 mg | ORAL_TABLET | Freq: Every day | ORAL | 3 refills | Status: DC

## 2016-01-29 NOTE — Telephone Encounter (Signed)
Pt needs refill on lexapro 20 mg #90 send to [facility] [address]

## 2016-01-29 NOTE — Telephone Encounter (Signed)
Prescription sent to [facility] as requested.

## 2016-02-11 ENCOUNTER — Telehealth: Payer: Self-pay | Admitting: Family Medicine

## 2016-02-11 NOTE — Telephone Encounter (Signed)
Pharmacy calling to confirm doctor is aware pt isi taking all 3 of these meds: traZODone (DESYREL) 50 MG tablet nortriptyline (PAMELOR) 75 MG capsule escitalopram (LEXAPRO) 20 MG tablet  They just want dr aware and if ok to fill his  Trazodone Pharm would like a callback.  [facility]/ [address]

## 2016-02-11 NOTE — Telephone Encounter (Signed)
Neurosurgery increased the nortriptyline and has helped patient. Other 2 help with GAD and sleep. We have discussed serotonin syndrome risk at prior visitand as long as he is aware of this may continue

## 2016-02-19 ENCOUNTER — Other Ambulatory Visit: Payer: Self-pay | Admitting: *Deleted

## 2016-02-19 MED ORDER — PROPRANOLOL HCL 10 MG PO TABS
ORAL_TABLET | ORAL | 3 refills | Status: DC
Start: 2016-02-19 — End: 2016-08-12

## 2016-02-27 DIAGNOSIS — H02102 Unspecified ectropion of right lower eyelid: Secondary | ICD-10-CM | POA: Diagnosis not present

## 2016-02-27 DIAGNOSIS — H02105 Unspecified ectropion of left lower eyelid: Secondary | ICD-10-CM | POA: Diagnosis not present

## 2016-03-03 DIAGNOSIS — D352 Benign neoplasm of pituitary gland: Secondary | ICD-10-CM | POA: Diagnosis not present

## 2016-03-03 DIAGNOSIS — E039 Hypothyroidism, unspecified: Secondary | ICD-10-CM | POA: Diagnosis not present

## 2016-03-03 LAB — BASIC METABOLIC PANEL
BUN: 20 mg/dL (ref 4–21)
CREATININE: 1.2 mg/dL (ref <=1.3)
Glucose: 95 mg/dL
POTASSIUM: 4.2 mmol/L (ref 3.4–5.3)
SODIUM: 137 mmol/L (ref 137–147)

## 2016-03-03 LAB — HEPATIC FUNCTION PANEL
ALT: 24 U/L (ref 10–40)
AST: 18 U/L (ref 14–40)

## 2016-03-05 DIAGNOSIS — D352 Benign neoplasm of pituitary gland: Secondary | ICD-10-CM | POA: Diagnosis not present

## 2016-03-05 DIAGNOSIS — R232 Flushing: Secondary | ICD-10-CM | POA: Diagnosis not present

## 2016-03-05 DIAGNOSIS — E039 Hypothyroidism, unspecified: Secondary | ICD-10-CM | POA: Diagnosis not present

## 2016-03-09 DIAGNOSIS — E291 Testicular hypofunction: Secondary | ICD-10-CM | POA: Diagnosis not present

## 2016-03-12 ENCOUNTER — Encounter: Payer: Self-pay | Admitting: Family Medicine

## 2016-03-19 ENCOUNTER — Encounter: Payer: Self-pay | Admitting: Family Medicine

## 2016-03-19 ENCOUNTER — Ambulatory Visit (INDEPENDENT_AMBULATORY_CARE_PROVIDER_SITE_OTHER): Payer: Medicare Other | Admitting: Family Medicine

## 2016-03-19 VITALS — BP 118/82 | HR 78 | Temp 98.0°F | Ht 68.5 in | Wt 283.4 lb

## 2016-03-19 DIAGNOSIS — J40 Bronchitis, not specified as acute or chronic: Secondary | ICD-10-CM

## 2016-03-19 MED ORDER — ALBUTEROL SULFATE HFA 108 (90 BASE) MCG/ACT IN AERS: 2.0000 | INHALATION_SPRAY | Freq: Four times a day (QID) | RESPIRATORY_TRACT | 0 refills | Status: DC | PRN

## 2016-03-19 MED ORDER — GUAIFENESIN-CODEINE 100-10 MG/5ML PO SOLN: 5.0000 mL | Freq: Four times a day (QID) | ORAL | 0 refills | Status: DC | PRN

## 2016-03-19 MED ORDER — METHYLPREDNISOLONE ACETATE 80 MG/ML IJ SUSP
80.0000 mg | Freq: Once | INTRAMUSCULAR | Status: AC
  Administered 2016-03-19: 80 mg via INTRAMUSCULAR

## 2016-03-19 NOTE — Progress Notes (Signed)
PCP: [name], MD  Subjective:  Wayne Booth is a 75 y.o. year old very pleasant male patient who presents with symptoms including severe cough, wheeze, nasal congestion without pressure, mild sore throat -started: 4 days ago and symptoms  are worsening -previous treatments:  Rest, hydration -sick contacts/travel/risks: denies flu exposure. Has been working at [employer] and may have had multiple sick contacts  ROS-denies fever, SOB (except with cough), NVD, tooth pain  Pertinent Past Medical History-  Patient Active Problem List   Diagnosis Date Noted  . 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 History of renal cell carcinoma 12/26/2007    Priority: High  . Cervical disc disease 11/13/2015    Priority: Medium  . Essential tremor 08/14/2015    Priority: Medium  . Gout 08/14/2015    Priority: Medium  . Hypothyroidism 07/12/2014    Priority: Medium  . Hyperglycemia 07/12/2014    Priority: Medium  . GAD (generalized anxiety disorder) 11/07/2013    Priority: Medium  . OSA (obstructive sleep apnea) 07/07/2011    Priority: Medium  . Morbid obesity ([place]) 10/02/2008    Priority: Medium  . Essential hypertension 12/28/2006    Priority: Medium  . Bronchospasm 06/09/2011    Priority: Low  . Actinic keratosis 11/27/2009    Priority: Low  . Osteoarthritis 11/13/2008    Priority: Low  . TESTOSTERONE DEFICIENCY 12/28/2006    Priority: Low   Medications- reviewed  Current Outpatient Prescriptions  Medication Sig Dispense Refill  . amLODipine (NORVASC) 5 MG tablet Take 1 tablet (5 mg total) by mouth daily. 90 tablet 3  . aspirin EC 81 MG tablet Take 81 mg by mouth daily.    Marland Kitchen escitalopram (LEXAPRO) 20 MG tablet Take 1 tablet (20 mg total) by mouth daily. TAKE 20 mg every day 90 tablet 3  . hydrochlorothiazide (HYDRODIURIL) 25 MG tablet TAKE 1 TABLET (25 MG TOTAL) BY MOUTH EVERY MORNING. 90 tablet 2  . levothyroxine (SYNTHROID, LEVOTHROID) 150 MCG tablet  150 mcg daily.    Marland Kitchen losartan (COZAAR) 100 MG tablet Take 1 tablet (100 mg total) by mouth daily. 90 tablet 3  . Multiple Vitamins-Minerals (MULTIVITAMINS THER. W/MINERALS) TABS Take 1 tablet by mouth daily.    . nortriptyline (PAMELOR) 75 MG capsule 75 mg at bedtime.    . propranolol (INDERAL) 10 MG tablet TAKE 1 TABLET (10 MG TOTAL) BY MOUTH 2 (TWO) TIMES DAILY. 60 tablet 3  . traZODone (DESYREL) 50 MG tablet TAKE 0.5-1 TABLETS (25-50 MG TOTAL) BY MOUTH AT BEDTIME AS NEEDED FORSLEEP. 90 tablet 3  . albuterol (PROVENTIL HFA;VENTOLIN HFA) 108 (90 Base) MCG/ACT inhaler Inhale 2 puffs into the lungs every 6 (six) hours as needed for wheezing or shortness of breath. 1 Inhaler 0  . guaiFENesin-codeine 100-10 MG/5ML syrup Take 5 mLs by mouth every 6 (six) hours as needed for cough. 120 mL 0   Objective: BP 118/82   Pulse 78   Temp 98 F (36.7 C) (Oral)   Ht 5' 8.5" (1.74 m)   Wt 283 lb 6.4 oz (128.5 kg)   SpO2 97%   BMI 42.46 kg/m  Gen: NAD, resting comfortably HEENT: Turbinates erythematous, TM normal, pharynx mildly erythematous with no tonsilar exudate or edema, no sinus tenderness CV: RRR no murmurs rubs or gallops Lungs: CTAB with wheeze bilaterally. no crackles, rhonchi Abdomen: soft/nontender/nondistended/normal bowel sounds. No rebound or guarding.  Ext: no edema Skin: warm, dry, no rash Neuro: grossly normal, moves all extremities  Assessment/Plan:  Acute bronchitis (likely viral) History and exam today are suggestive of bronchitis which has high probability of being viral and even if not antibiotics not of clearest benefit (perhaps treat 40 to prevent 1 pneumonia).  We also discussed reasons why current illness does not meet criteria for bacterial illness and therefore no antibiotics indicated. Also educated on signs that bacterial infection may have developed. We discussed treatment side effects, likely course, antibiotic misuse, transmission, and signs of developing a serious  illness.  Symptomatic treatment with: depo-medrol 80mg  today. Will also given codeine cough syrup. Albuterol as needed for wheeze.   Noted history in past of bronchospasm- apparently used to be on qvar. If he does not improve in next few weeks or worsen would consider antibiotics plus course of prednisone. He is well appearing on exam and in no distress at present.   Finally, we reviewed reasons to return to care including if symptoms worsen or persist or new concerns arise.  Meds ordered this encounter  Medications  . guaiFENesin-codeine 100-10 MG/5ML syrup    Sig: Take 5 mLs by mouth every 6 (six) hours as needed for cough.    Dispense:  120 mL    Refill:  0  . albuterol (PROVENTIL HFA;VENTOLIN HFA) 108 (90 Base) MCG/ACT inhaler    Sig: Inhale 2 puffs into the lungs every 6 (six) hours as needed for wheezing or shortness of breath.    Dispense:  1 Inhaler    Refill:  0  . methylPREDNISolone acetate (DEPO-MEDROL) injection 80 mg    [name], MD

## 2016-03-19 NOTE — Patient Instructions (Signed)
Unfortunately this sounds like bronchitis and may last 3-4 weeks (at times up to 6 weeks).   Depo medrol steroid shot to reduce inflammation  Albuterol inhaler for wheezing if affordable  Cough medicine- would primarily use at night to help you sleep or if you are going to be around the house as should not drive for 8 hours.   Antibiotics are generally not helpful with bronchitis. Takes treating 40 people to prevent 1 pneumonia. I do want you to call me ASAP if you get fever, shortness of breath (when you are not coughing) or worsening symptoms   Acute Bronchitis Bronchitis is inflammation of the airways that extend from the windpipe into the lungs (bronchi). The inflammation often causes mucus to develop. This leads to a cough, which is the most common symptom of bronchitis.  In acute bronchitis, the condition usually develops suddenly and goes away over time, usually in a several weeks. Smoking, allergies, and asthma can make bronchitis worse. Repeated episodes of bronchitis may cause further lung problems.  CAUSES Acute bronchitis is most often caused by the same virus that causes a cold. The virus can spread from person to person (contagious) through coughing, sneezing, and touching contaminated objects. SIGNS AND SYMPTOMS   Cough.   Fever.   Coughing up mucus.   Body aches.   Chest congestion.   Chills.   Shortness of breath.   Sore throat.  DIAGNOSIS  Acute bronchitis is usually diagnosed through a physical exam. Your health care provider will also ask you questions about your medical history. Tests, such as chest X-rays, are sometimes done to rule out other conditions (we would consider this if weneed to follow up) TREATMENT  Acute bronchitis usually goes away in a couple weeks. Oftentimes, no medical treatment is necessary. Medicines are sometimes given for relief of fever or cough. Antibiotic medicines are usually not needed but may be prescribed in certain  situations. In some cases, an inhaler may be recommended to help reduce shortness of breath and control the cough. A cool mist vaporizer may also be used to help thin bronchial secretions and make it easier to clear the chest.  HOME CARE INSTRUCTIONS  Get plenty of rest.   Drink enough fluids to keep your urine clear or pale yellow (unless you have a medical condition that requires fluid restriction). Increasing fluids may help thin your respiratory secretions (sputum) and reduce chest congestion, and it will prevent dehydration.   Take medicines only as directed by your health care provider.  If you were prescribed an antibiotic medicine, finish it all even if you start to feel better.  Avoid smoking and secondhand smoke. Exposure to cigarette smoke or irritating chemicals will make bronchitis worse. If you are a smoker, consider using nicotine gum or skin patches to help control withdrawal symptoms. Quitting smoking will help your lungs heal faster.   Reduce the chances of another bout of acute bronchitis by washing your hands frequently, avoiding people with cold symptoms, and trying not to touch your hands to your mouth, nose, or eyes.   Keep all follow-up visits as directed by your health care provider.  SEEK MEDICAL CARE IF: Your symptoms do not improve after 1 week of treatment.  SEEK IMMEDIATE MEDICAL CARE IF:  You develop an increased fever or chills.   You have chest pain.   You have severe shortness of breath.  You have bloody sputum.   You develop dehydration.  You faint or repeatedly feel like you are  going to pass out.  You develop repeated vomiting.  You develop a severe headache. MAKE SURE YOU:   Understand these instructions.  Will watch your condition.  Will get help right away if you are not doing well or get worse.   This information is not intended to replace advice given to you by your health care provider. Make sure you discuss any questions  you have with your health care provider.   Document Released: 07/23/2004 Document Revised: 07/06/2014 Document Reviewed: 12/06/2012 Elsevier Interactive Patient Education [employer].

## 2016-03-19 NOTE — Progress Notes (Signed)
Pre visit review using our clinic review tool, if applicable. No additional management support is needed unless otherwise documented below in the visit note. 

## 2016-03-24 ENCOUNTER — Ambulatory Visit (INDEPENDENT_AMBULATORY_CARE_PROVIDER_SITE_OTHER): Payer: Medicare Other | Admitting: Family Medicine

## 2016-03-24 ENCOUNTER — Encounter: Payer: Self-pay | Admitting: Family Medicine

## 2016-03-24 VITALS — BP 130/80 | HR 99 | Temp 98.0°F | Wt 283.2 lb

## 2016-03-24 DIAGNOSIS — J189 Pneumonia, unspecified organism: Secondary | ICD-10-CM | POA: Diagnosis not present

## 2016-03-24 MED ORDER — PREDNISONE 20 MG PO TABS: ORAL_TABLET | ORAL | 0 refills | Status: DC

## 2016-03-24 MED ORDER — LEVOFLOXACIN 750 MG PO TABS: 750.0000 mg | ORAL_TABLET | Freq: Every day | ORAL | 0 refills | Status: DC

## 2016-03-24 NOTE — Progress Notes (Signed)
Subjective:  Wayne Booth is a 75 y.o. year old very pleasant male patient who presents for/with See problem oriented charting ROS- no chest pain. Reports fatigue, subjective fevers, shortness of breath.see any ROS included in HPI as well.   Past Medical History-  Patient Active Problem List   Diagnosis Date Noted  . 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 History of renal cell carcinoma 12/26/2007    Priority: High  . Cervical disc disease 11/13/2015    Priority: Medium  . Essential tremor 08/14/2015    Priority: Medium  . Gout 08/14/2015    Priority: Medium  . Hypothyroidism 07/12/2014    Priority: Medium  . Hyperglycemia 07/12/2014    Priority: Medium  . GAD (generalized anxiety disorder) 11/07/2013    Priority: Medium  . OSA (obstructive sleep apnea) 07/07/2011    Priority: Medium  . Morbid obesity ([place]) 10/02/2008    Priority: Medium  . Essential hypertension 12/28/2006    Priority: Medium  . Bronchospasm 06/09/2011    Priority: Low  . Actinic keratosis 11/27/2009    Priority: Low  . Osteoarthritis 11/13/2008    Priority: Low  . TESTOSTERONE DEFICIENCY 12/28/2006    Priority: Low    Medications- reviewed and updated Current Outpatient Prescriptions  Medication Sig Dispense Refill  . albuterol (PROVENTIL HFA;VENTOLIN HFA) 108 (90 Base) MCG/ACT inhaler Inhale 2 puffs into the lungs every 6 (six) hours as needed for wheezing or shortness of breath. 1 Inhaler 0  . amLODipine (NORVASC) 5 MG tablet Take 1 tablet (5 mg total) by mouth daily. 90 tablet 3  . aspirin EC 81 MG tablet Take 81 mg by mouth daily.    Marland Kitchen escitalopram (LEXAPRO) 20 MG tablet Take 1 tablet (20 mg total) by mouth daily. TAKE 20 mg every day 90 tablet 3  . guaiFENesin-codeine 100-10 MG/5ML syrup Take 5 mLs by mouth every 6 (six) hours as needed for cough. 120 mL 0  . hydrochlorothiazide (HYDRODIURIL) 25 MG tablet TAKE 1 TABLET (25 MG TOTAL) BY MOUTH EVERY MORNING. 90 tablet 2  .  levothyroxine (SYNTHROID, LEVOTHROID) 150 MCG tablet 150 mcg daily.    Marland Kitchen losartan (COZAAR) 100 MG tablet Take 1 tablet (100 mg total) by mouth daily. 90 tablet 3  . Multiple Vitamins-Minerals (MULTIVITAMINS THER. W/MINERALS) TABS Take 1 tablet by mouth daily.    . nortriptyline (PAMELOR) 75 MG capsule 75 mg at bedtime.    . propranolol (INDERAL) 10 MG tablet TAKE 1 TABLET (10 MG TOTAL) BY MOUTH 2 (TWO) TIMES DAILY. 60 tablet 3  . traZODone (DESYREL) 50 MG tablet TAKE 0.5-1 TABLETS (25-50 MG TOTAL) BY MOUTH AT BEDTIME AS NEEDED FORSLEEP. 90 tablet 3  . levofloxacin (LEVAQUIN) 750 MG tablet Take 1 tablet (750 mg total) by mouth daily. 5 tablet 0  . predniSONE (DELTASONE) 20 MG tablet Take 2 pills for 3 days, 1 pill for 4 days 10 tablet 0   No current facility-administered medications for this visit.     Objective: BP 130/80 (BP Location: Left Arm, Patient Position: Sitting, Cuff Size: Large)   Pulse 99   Temp 98 F (36.7 C) (Oral)   Wt 283 lb 3.2 oz (128.5 kg)   SpO2 95%   BMI 42.43 kg/m  Gen: NAD, appears more fatigued than last visit but no respiratory distress CV: RRR no murmurs rubs or gallops Lungs: diffuse wheeze, rhonchi. Crackles noted LLL Abdomen: soft/nontender/nondistended/normal bowel sounds. No rebound or guarding.  Ext: no edema Skin: warm,  dry, no rash Neuro: grossly normal, moves all extremities  Assessment/Plan:  New onset Community acquired pneumonia S: symptoms started 03/15/16 with severe Cough, wheeze, nasal congestion. Diagnosed with bronchitis- given depo medrol injection and albuterol for wheezing. He was feeling better for a few days then over last day or two symptoms started getting worse. Cough became more severe and frequent. New shortness of breath even walking out to car, prior just with coughing. Subjective fevers but has not measured.  A/P: 75 year old male diagnosed with bronchitis a few days ago now presents with subjective fevers, shortness of breath,  and found to have crackles in LLL- now with progression to pneumonia. We will treat patient with levaquin (considered augmentin and azithromycin bu tthis also carries qt prolongation risk- though last ekg shows not prologed qt and regimen more complex). Will also treat with prednisone given continued wheeze. Follow up for new or worsening symptoms or if not improving. Would want to get CXR before next visit if not improving.   Meds ordered this encounter  Medications  . predniSONE (DELTASONE) 20 MG tablet    Sig: Take 2 pills for 3 days, 1 pill for 4 days    Dispense:  10 tablet    Refill:  0  . levofloxacin (LEVAQUIN) 750 MG tablet    Sig: Take 1 tablet (750 mg total) by mouth daily.    Dispense:  5 tablet    Refill:  0   Prior bronchitis but PNA is a new acute condition requiring medication management in higher risk patient with age, history of bronchospasm, morbid obesity, OSA. Did not have PNA at prior visit.  Return precautions advised.  [name], MD

## 2016-03-24 NOTE — Progress Notes (Signed)
Pre visit review using our clinic review tool, if applicable. No additional management support is needed unless otherwise documented below in the visit note. 

## 2016-03-24 NOTE — Patient Instructions (Signed)
Glad you came back in with new shortness of breath, you have developed pneumonia in Left lower lung on exam.   Treat with levaquin for 5 days- wheezing still prominent- also cover with prednisone  Return if not improving or for new or worsening symptoms- call in AM and likely get CXR before you come to see me

## 2016-03-27 DIAGNOSIS — N401 Enlarged prostate with lower urinary tract symptoms: Secondary | ICD-10-CM | POA: Diagnosis not present

## 2016-03-27 DIAGNOSIS — R350 Frequency of micturition: Secondary | ICD-10-CM | POA: Diagnosis not present

## 2016-04-21 ENCOUNTER — Other Ambulatory Visit: Payer: Self-pay | Admitting: Family Medicine

## 2016-04-24 ENCOUNTER — Ambulatory Visit (INDEPENDENT_AMBULATORY_CARE_PROVIDER_SITE_OTHER): Payer: Medicare Other

## 2016-04-24 DIAGNOSIS — Z23 Encounter for immunization: Secondary | ICD-10-CM

## 2016-05-13 ENCOUNTER — Ambulatory Visit: Payer: Medicare Other | Admitting: Family Medicine

## 2016-06-01 ENCOUNTER — Ambulatory Visit (INDEPENDENT_AMBULATORY_CARE_PROVIDER_SITE_OTHER): Payer: Medicare Other | Admitting: Family Medicine

## 2016-06-01 ENCOUNTER — Encounter: Payer: Self-pay | Admitting: Family Medicine

## 2016-06-01 VITALS — BP 124/88 | HR 87 | Wt 285.0 lb

## 2016-06-01 DIAGNOSIS — R42 Dizziness and giddiness: Secondary | ICD-10-CM

## 2016-06-01 DIAGNOSIS — R0789 Other chest pain: Secondary | ICD-10-CM | POA: Diagnosis not present

## 2016-06-01 NOTE — Progress Notes (Signed)
Pre visit review using our clinic review tool, if applicable. No additional management support is needed unless otherwise documented below in the visit note. 

## 2016-06-01 NOTE — Patient Instructions (Addendum)
I think your issue is related to blood pressure but with the sweatiness, dizziness, chest pain you had altogether- want you to see cardiology, referral placed today  I want you to continue propranolol but hold on amlodipine and hydrochlorothiazide for now.   I want to make sure you are not having further episodes and that blood pressure not up too high- let's see each other back Thursday 8 AM (check out desk will need to create a slot for this)   If you have another episode like you had- seek care immediatley

## 2016-06-01 NOTE — Progress Notes (Signed)
Subjective:  Wayne Booth is a 75 y.o. year old very pleasant male patient who presents for/with See problem oriented charting ROS- see any ROS included in S/HPI   Past Medical History-  Patient Active Problem List   Diagnosis Date Noted  . 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 History of renal cell carcinoma 12/26/2007    Priority: High  . Cervical disc disease 11/13/2015    Priority: Medium  . Essential tremor 08/14/2015    Priority: Medium  . Gout 08/14/2015    Priority: Medium  . Hypothyroidism 07/12/2014    Priority: Medium  . Hyperglycemia 07/12/2014    Priority: Medium  . GAD (generalized anxiety disorder) 11/07/2013    Priority: Medium  . OSA (obstructive sleep apnea) 07/07/2011    Priority: Medium  . Morbid obesity ([place]) 10/02/2008    Priority: Medium  . Essential hypertension 12/28/2006    Priority: Medium  . Bronchospasm 06/09/2011    Priority: Low  . Actinic keratosis 11/27/2009    Priority: Low  . Osteoarthritis 11/13/2008    Priority: Low  . TESTOSTERONE DEFICIENCY 12/28/2006    Priority: Low    Medications- reviewed and updated Current Outpatient Prescriptions  Medication Sig Dispense Refill  . albuterol (PROVENTIL HFA;VENTOLIN HFA) 108 (90 Base) MCG/ACT inhaler Inhale 2 puffs into the lungs every 6 (six) hours as needed for wheezing or shortness of breath. 1 Inhaler 0  . amLODipine (NORVASC) 5 MG tablet Take 1 tablet (5 mg total) by mouth daily. 90 tablet 3  . aspirin EC 81 MG tablet Take 81 mg by mouth daily.    Marland Kitchen escitalopram (LEXAPRO) 20 MG tablet Take 1 tablet (20 mg total) by mouth daily. TAKE 20 mg every day 90 tablet 3  . hydrochlorothiazide (HYDRODIURIL) 25 MG tablet TAKE 1 TABLET (25 MG TOTAL) BY MOUTH EVERY MORNING. 90 tablet 2  . levofloxacin (LEVAQUIN) 750 MG tablet Take 1 tablet (750 mg total) by mouth daily. 5 tablet 0  . levothyroxine (SYNTHROID, LEVOTHROID) 150 MCG tablet 150 mcg daily.    Marland Kitchen losartan (COZAAR) 100  MG tablet Take 1 tablet (100 mg total) by mouth daily. 90 tablet 3  . Multiple Vitamins-Minerals (MULTIVITAMINS THER. W/MINERALS) TABS Take 1 tablet by mouth daily.    . nortriptyline (PAMELOR) 75 MG capsule 75 mg at bedtime.    . propranolol (INDERAL) 10 MG tablet TAKE 1 TABLET (10 MG TOTAL) BY MOUTH 2 (TWO) TIMES DAILY. 60 tablet 3  . traZODone (DESYREL) 50 MG tablet TAKE 0.5-1 TABLETS (25-50 MG TOTAL) BY MOUTH AT BEDTIME AS NEEDED FORSLEEP. 90 tablet 3  . triamcinolone cream (KENALOG) 0.1 % APPLY 1 APPLICATION TOPICALLY 2 (TWO) TIMES DAILY. FOR 1 WEEK MAX BEFORE AT LEAST A WEEK OFF- MAY RETRIAL AFTER THAT 80 g 0   No current facility-administered medications for this visit.     Objective: BP 124/88 (BP Location: Left Arm, Patient Position: Sitting, Cuff Size: Large)   Pulse 87   Wt 285 lb (129.3 kg)   SpO2 99%   BMI 42.70 kg/m  Gen: NAD, resting comfortably in chair, mild lightheadedness with standing CV: RRR no murmurs rubs or gallops Lungs: CTAB no crackles, wheeze, rhonchi Abdomen: soft/nontender/nondistended/normal bowel sounds. No rebound or guarding.  Ext: trace edema Skin: warm, dry Neuro: grossly normal, moves all extremities  EKG: Raterate 79, sinus rhythm with 1st degree av block, normal axis, normal qrs, and qt interval, no hypertrophy, q waves in v1 and v2, II, Avf  Stress test Tsh, cbc, bmet   Assessment/Plan:  Dizzy episode  Atypical chest pain S: intermittent dizzy spells. Rather intense one on Friday. Most commonly happens when going from sitting to standing. Friday felt clammy somewhat- but that doesn't usually happen. One on Friday almost made him want to go down to his knees. States was hydrated that day. Thinks may have had a twinge of chest pain on Friday with it then he laid around the rest of the day. Was sweaty. Mild nausea. Have been going on for at least year. Symptoms have improved since stopping BP meds yesterday.  ROS- no chest pain with episodes  prior to Friday, no shortness of breath. No vertigo A/P: I strongly suspect the dizzy episodes have been orthostatic in nature. Hold all BP meds (hctz, amlodipine, losartan which he has stopped as of yesterday and feels better) but propranolol which he took today for his tremor (reassess Wednesday AM)His orthostatic vital signs dropped over 20 points systolic from standing to sitting. That being said- his episode  (severe dizziness, fatigued afterwards, twinge of chest pain) and what appear to be new q waves in v2, II, avf in a morbidly obese patient with hypertension at his age with recent dizzy episode concerns me for cardiac cause- over 3 days ago so will not send to ED but will do a stat referral to cardiology for their opinion.   Thursday Am follow up  Orders Placed This Encounter  Procedures  . EKG 12-Lead    Order Specific Question:   Where should this test be performed    Answer:   Other  new acute problem unclear etiology in high risk patient with medication management  Return precautions advised- specifically if recurs off BP meds to go to ER/call 911 [name], MD

## 2016-06-04 ENCOUNTER — Encounter: Payer: Self-pay | Admitting: Family Medicine

## 2016-06-04 ENCOUNTER — Ambulatory Visit (INDEPENDENT_AMBULATORY_CARE_PROVIDER_SITE_OTHER): Payer: Medicare Other | Admitting: Family Medicine

## 2016-06-04 VITALS — BP 138/84 | HR 97 | Temp 97.9°F | Ht 68.5 in | Wt 287.6 lb

## 2016-06-04 DIAGNOSIS — R42 Dizziness and giddiness: Secondary | ICD-10-CM | POA: Diagnosis not present

## 2016-06-04 DIAGNOSIS — R0789 Other chest pain: Secondary | ICD-10-CM | POA: Diagnosis not present

## 2016-06-04 NOTE — Addendum Note (Signed)
Addended by: Marin Olp on: 06/04/2016 09:59 AM   Modules accepted: Orders

## 2016-06-04 NOTE — Progress Notes (Signed)
Pre visit review using our clinic review tool, if applicable. No additional management support is needed unless otherwise documented below in the visit note. 

## 2016-06-04 NOTE — Patient Instructions (Signed)
Remain off losartan and HCTZ, continue propranolol- if cardiology visit over 2 weeks out, would like to see patient back for blood pressure recheck since he does not check blood pressure at home

## 2016-06-04 NOTE — Progress Notes (Addendum)
Subjective:  Wayne Booth is a 75 y.o. year old very pleasant male patient who presents for/with See problem oriented charting ROS- since Friday- no dizziness. No chest pain or shortness of breath. No headache or blurry vision.   Past Medical History-  Patient Active Problem List   Diagnosis Date Noted  . 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 History of renal cell carcinoma 12/26/2007    Priority: High  . Cervical disc disease 11/13/2015    Priority: Medium  . Essential tremor 08/14/2015    Priority: Medium  . Gout 08/14/2015    Priority: Medium  . Hypothyroidism 07/12/2014    Priority: Medium  . Hyperglycemia 07/12/2014    Priority: Medium  . GAD (generalized anxiety disorder) 11/07/2013    Priority: Medium  . OSA (obstructive sleep apnea) 07/07/2011    Priority: Medium  . Morbid obesity ([place]) 10/02/2008    Priority: Medium  . Essential hypertension 12/28/2006    Priority: Medium  . Bronchospasm 06/09/2011    Priority: Low  . Actinic keratosis 11/27/2009    Priority: Low  . Osteoarthritis 11/13/2008    Priority: Low  . TESTOSTERONE DEFICIENCY 12/28/2006    Priority: Low    Medications- reviewed and updated Current Outpatient Prescriptions  Medication Sig Dispense Refill  . albuterol (PROVENTIL HFA;VENTOLIN HFA) 108 (90 Base) MCG/ACT inhaler Inhale 2 puffs into the lungs every 6 (six) hours as needed for wheezing or shortness of breath. 1 Inhaler 0  . amLODipine (NORVASC) 5 MG tablet Take 1 tablet (5 mg total) by mouth daily. 90 tablet 3  . aspirin EC 81 MG tablet Take 81 mg by mouth daily.    Marland Kitchen escitalopram (LEXAPRO) 20 MG tablet Take 1 tablet (20 mg total) by mouth daily. TAKE 20 mg every day 90 tablet 3  . hydrochlorothiazide (HYDRODIURIL) 25 MG tablet TAKE 1 TABLET (25 MG TOTAL) BY MOUTH EVERY MORNING. 90 tablet 2  . levofloxacin (LEVAQUIN) 750 MG tablet Take 1 tablet (750 mg total) by mouth daily. 5 tablet 0  . levothyroxine (SYNTHROID,  LEVOTHROID) 150 MCG tablet 150 mcg daily.    Marland Kitchen losartan (COZAAR) 100 MG tablet Take 1 tablet (100 mg total) by mouth daily. 90 tablet 3  . Multiple Vitamins-Minerals (MULTIVITAMINS THER. W/MINERALS) TABS Take 1 tablet by mouth daily.    . nortriptyline (PAMELOR) 75 MG capsule 75 mg at bedtime.    . propranolol (INDERAL) 10 MG tablet TAKE 1 TABLET (10 MG TOTAL) BY MOUTH 2 (TWO) TIMES DAILY. 60 tablet 3  . traZODone (DESYREL) 50 MG tablet TAKE 0.5-1 TABLETS (25-50 MG TOTAL) BY MOUTH AT BEDTIME AS NEEDED FORSLEEP. 90 tablet 3  . triamcinolone cream (KENALOG) 0.1 % APPLY 1 APPLICATION TOPICALLY 2 (TWO) TIMES DAILY. FOR 1 WEEK MAX BEFORE AT LEAST A WEEK OFF- MAY RETRIAL AFTER THAT 80 g 0   No current facility-administered medications for this visit.     Objective: BP 138/84 (BP Location: Left Arm, Patient Position: Sitting, Cuff Size: Large)   Pulse 97   Temp 97.9 F (36.6 C) (Oral)   Ht 5' 8.5" (1.74 m)   Wt 287 lb 9.6 oz (130.5 kg)   SpO2 95%   BMI 43.09 kg/m  Gen: NAD, resting comfortably CV: RRR no murmurs rubs or gallops Lungs: CTAB no crackles, wheeze, rhonchi Abdomen: obese Ext: trace edema Skin: warm, dry, no rash Neuro: grossly normal, moves all extremities, not lightheaded with standing today  Assessment/Plan:  Dizzy episode associated with  chest pain S: severe dizzy episode last Friday associated with short lived chest pain as well. Patient was orthostatic at visit. We held all BP meds but propranolol and patient has had no further episodes- had intermittent dizziness prior but never as severe- stated almost brought him to his knees. He has been completely symptom free since that time- no dizziness, chest pain, shortness of breath  When he went home discovered was not taking amlodipine. We held hctz 25mg  and losartan 100mg . Discussed possible new q wave in v2 and avf with Dr. Ellyn Hack of [facility] cardiology and he stated variance from prior was minimal so ok to wait on cardiology  visit.  A/P: Once again I think this episode was orthostatic in nature but we willhave patient see cardiology in next few weeks still. Remain off losartan and HCTZ, continue propranolol- if cardiology visit over 2 weeks out, would like to see patient back for blood pressure recheck since he does not check blood pressure at home   Return precautions advised.  [name], MD

## 2016-06-16 ENCOUNTER — Encounter: Payer: Self-pay | Admitting: Interventional Cardiology

## 2016-06-16 ENCOUNTER — Ambulatory Visit (INDEPENDENT_AMBULATORY_CARE_PROVIDER_SITE_OTHER): Payer: Medicare Other | Admitting: Interventional Cardiology

## 2016-06-16 VITALS — BP 142/86 | HR 76 | Ht 68.5 in | Wt 287.0 lb

## 2016-06-16 DIAGNOSIS — R42 Dizziness and giddiness: Secondary | ICD-10-CM

## 2016-06-16 DIAGNOSIS — I10 Essential (primary) hypertension: Secondary | ICD-10-CM | POA: Diagnosis not present

## 2016-06-16 DIAGNOSIS — R251 Tremor, unspecified: Secondary | ICD-10-CM | POA: Diagnosis not present

## 2016-06-16 DIAGNOSIS — R072 Precordial pain: Secondary | ICD-10-CM | POA: Diagnosis not present

## 2016-06-16 NOTE — Patient Instructions (Addendum)
Medication Instructions:  Resume Losartan at 50 mg daily. All other medications remain the same.  Labwork: None  Testing/Procedures: None  Follow-Up: Your physician wants you to follow-up in: 4 months. You will receive a reminder letter in the mail two months in advance. If you don't receive a letter, please call our office to schedule the follow-up appointment.   Any Other Special Instructions Will Be Listed Below (If Applicable). Please discuss weight loss referral with your Primary Care MD.    If you need a refill on your cardiac medications before your next appointment, please call your pharmacy.

## 2016-06-16 NOTE — Progress Notes (Signed)
Cardiology Office Note   Date:  06/16/2016   ID:  Wayne Booth, Wayne Booth [birth year], MRN [id number]  PCP:  [name], MD    No chief complaint on file. Dizziness, abnormal ECG   Wt Readings from Last 3 Encounters:  06/16/16 287 lb (130.2 kg)  06/04/16 287 lb 9.6 oz (130.5 kg)  06/01/16 285 lb (129.3 kg)       History of Present Illness: Wayne Booth is a 75 y.o. male  With obesity and OSA.  He has had HTN as well.  He has had dizziness intermittently for years, but it got worse in early Dec 2017.  His BP meds were reduced and he has had no further episodes.  Dizziness would occur would when going from sitting to standing.  No syncope.    He had some chest pain with his most severe dizzy episode in early December.  It lasted a few minutes.  It was a pressure feeling.  It resolved but he felt tired that day.    He had a cath in 4/08 that was normal.    He reports some wheezing with activity. He had asthma as a child but this has resolved. His wife feels that his exertional issues are related to his weight. He had lost weight several years ago, but gained it back.  No syncope. No palpitations. No exertional chest discomfort. He does not exercise much. His most strenuous activity is walking in the grocery store.    An ECG was done after this episode a few weeks ago. There was a concern about change in his R wave progression. It was reviewed by the on-call cardiologist and they felt that the appointment could wait. Since that time, the patient has felt well. No further dizziness since stopping his blood pressure medicines.     Past Medical History:  Diagnosis Date  . Anxiety   . Arthritis   . Cancer ([place])   . History of total knee replacement   . Hypertension   . Hypothyroidism   . Kidney cancer, primary, with metastasis from kidney to other site [facility])   . Kidney stone   . OSA (obstructive sleep apnea)    pt does not wear cpap at night  . Pituitary cyst ([place])     . Pneumonia    hx of  . Prostate cancer [facility])     Past Surgical History:  Procedure Laterality Date  . ANTERIOR CERVICAL DECOMP/DISCECTOMY FUSION N/A 08/29/2012   Procedure: ANTERIOR CERVICAL DECOMPRESSION/DISCECTOMY FUSION 1 LEVEL;  Surgeon: Eustace Moore, MD;  Location: [place] [facility]UR[facility] ORS;  Service: Neurosurgery;  Laterality: N/A;  . APPENDECTOMY    . CARDIAC CATHETERIZATION     no PCI  . CHOLECYSTECTOMY    . COLONOSCOPY W/ POLYPECTOMY    . CRANIOTOMY N/A 11/08/2012   Procedure: CRANIOTOMY HYPOPHYSECTOMY TRANSNASAL APPROACH;  Surgeon: Faythe Ghee, MD;  Location: [place] [facility];  Service: Neurosurgery;  Laterality: N/A;  Transphenoidal Resection of Pituitary Tumor  . INSERTION PROSTATE RADIATION SEED    . KIDNEY STONE SURGERY    . KNEE ARTHROSCOPY  2007   left  . NEPHRECTOMY Left   . POSTERIOR CERVICAL LAMINECTOMY Left 04/24/2015   Procedure: Foraminotomy cervical five - cervical six cervical six - cervical seven left;  Surgeon: Eustace Moore, MD;  Location: [facility];  Service: Neurosurgery;  Laterality: Left;  . REPLACEMENT TOTAL KNEE BILATERAL       Current Outpatient Prescriptions  Medication Sig  Dispense Refill  . albuterol (PROVENTIL HFA;VENTOLIN HFA) 108 (90 Base) MCG/ACT inhaler Inhale 2 puffs into the lungs every 6 (six) hours as needed for wheezing or shortness of breath. 1 Inhaler 0  . aspirin EC 81 MG tablet Take 81 mg by mouth daily.    Marland Kitchen escitalopram (LEXAPRO) 20 MG tablet Take 1 tablet (20 mg total) by mouth daily. TAKE 20 mg every day 90 tablet 3  . levofloxacin (LEVAQUIN) 750 MG tablet Take 1 tablet (750 mg total) by mouth daily. 5 tablet 0  . levothyroxine (SYNTHROID, LEVOTHROID) 150 MCG tablet 150 mcg daily.    . Multiple Vitamins-Minerals (MULTIVITAMINS THER. W/MINERALS) TABS Take 1 tablet by mouth daily.    . nortriptyline (PAMELOR) 75 MG capsule 75 mg at bedtime.    . propranolol (INDERAL) 10 MG tablet TAKE 1 TABLET (10 MG TOTAL) BY MOUTH 2 (TWO) TIMES DAILY.  60 tablet 3  . traZODone (DESYREL) 50 MG tablet TAKE 0.5-1 TABLETS (25-50 MG TOTAL) BY MOUTH AT BEDTIME AS NEEDED FORSLEEP. 90 tablet 3  . triamcinolone cream (KENALOG) 0.1 % APPLY 1 APPLICATION TOPICALLY 2 (TWO) TIMES DAILY. FOR 1 WEEK MAX BEFORE AT LEAST A WEEK OFF- MAY RETRIAL AFTER THAT 80 g 0  . hydrochlorothiazide (HYDRODIURIL) 25 MG tablet TAKE 1 TABLET (25 MG TOTAL) BY MOUTH EVERY MORNING. (Patient not taking: Reported on 06/16/2016) 90 tablet 2  . losartan (COZAAR) 100 MG tablet Take 1 tablet (100 mg total) by mouth daily. (Patient not taking: Reported on 06/16/2016) 90 tablet 3   No current facility-administered medications for this visit.     Allergies:   Patient has no known allergies.    Social History:  The patient  reports that he has been smoking Cigars.  He has smoked for the past 4.00 years. He has never used smokeless tobacco. He reports that he drinks about 1.2 oz of alcohol per week . He reports that he does not use drugs.   Family History:  The patient's family history includes Cancer in his father and mother; Colon cancer in his brother; Kidney cancer in his sister.    ROS:  Please see the history of present illness.   Otherwise, review of systems are positive for dizziness.   All other systems are reviewed and negative.    PHYSICAL EXAM: VS:  BP (!) 142/86   Pulse 76   Ht 5' 8.5" (1.74 m)   Wt 287 lb (130.2 kg)   BMI 43.00 kg/m  , BMI Body mass index is 43 kg/m. GEN: Well nourished, well developed, in no acute distress  HEENT: normal  Neck: no JVD, carotid bruits, or masses Cardiac: RRR; no murmurs, rubs, or gallops,no edema  Respiratory:  clear to auscultation bilaterally, normal work of breathing GI: soft, nontender, nondistended, + BS MS: no deformity or atrophy  Skin: warm and dry, no rash Neuro:  Strength and sensation are intact Psych: euthymic mood, full affect   EKG:   The ekg ordered In early December demonstrates normal sinus rhythm, no ST  segment changes; minimal change from prior ECG in 2016, likely related to lead placement.   Recent Labs: 11/07/2015: Hemoglobin 17.5; Platelets 316.0; TSH 0.32 03/03/2016: ALT 24; BUN 20; Creatinine 1.2; Potassium 4.2; Sodium 137   Lipid Panel    Component Value Date/Time   CHOL 169 11/07/2015 0833   TRIG 189.0 (H) 11/07/2015 0833   HDL 33.30 (L) 11/07/2015 0833   CHOLHDL 5 11/07/2015 0833   VLDL 37.8 11/07/2015 [id number]  [place] 98 11/07/2015 0833   LDLDIRECT 62.0 09/07/2006 0920     Other studies Reviewed: Additional studies/ records that were reviewed today with results demonstrating: 2008 cath report reviewed.   ASSESSMENT AND PLAN:  1. Dizziness: Encouraged him to stay well hydrated. His blood pressure is higher now. He was on several antihypertensives. Will add back losartan 50 mg daily. Continue to hold the amlodipine and diuretic. Hopefully, this will help get his blood pressure closer to the 120 to 130.   I encouraged him to try to stay active as well. Should try to reduce salt intake and try to lose weight. This would also help with his blood pressure. 30 range systolic, without causing any hypotension symptoms.   2. Chest discomfort: Only occurred when he was dizzy. No further episodes. Nothing related to exertion. Will try to control his blood pressure. If with walking, he develops more discomfort, would then plan for some type of ischemia testing.  No significant change on ECG. 3. Continue propranolol for tremor.  It will help with BP as well.    Current medicines are reviewed at length with the patient today.  The patient concerns regarding his medicines were addressed.  The following changes have been made:  No change  Labs/ tests ordered today include:  No orders of the defined types were placed in this encounter.   Recommend 150 minutes/week of aerobic exercise Low fat, low carb, high fiber diet recommended  Disposition:   FU 4 months   Signed, Larae Grooms, MD  06/16/2016 12:06 PM    [address], [place], [place]  [address] Phone: [phone]; Fax: [phone]

## 2016-07-01 ENCOUNTER — Encounter: Payer: Self-pay | Admitting: Family Medicine

## 2016-07-01 ENCOUNTER — Other Ambulatory Visit: Payer: Self-pay

## 2016-07-01 ENCOUNTER — Ambulatory Visit (INDEPENDENT_AMBULATORY_CARE_PROVIDER_SITE_OTHER): Payer: Medicare Other | Admitting: Family Medicine

## 2016-07-01 VITALS — BP 130/80 | HR 73 | Ht 68.5 in | Wt 289.2 lb

## 2016-07-01 DIAGNOSIS — R221 Localized swelling, mass and lump, neck: Secondary | ICD-10-CM | POA: Diagnosis not present

## 2016-07-01 DIAGNOSIS — I10 Essential (primary) hypertension: Secondary | ICD-10-CM

## 2016-07-01 MED ORDER — ALBUTEROL SULFATE HFA 108 (90 BASE) MCG/ACT IN AERS: 2.0000 | INHALATION_SPRAY | Freq: Four times a day (QID) | RESPIRATORY_TRACT | 0 refills | Status: DC | PRN

## 2016-07-01 MED ORDER — LEVOTHYROXINE SODIUM 150 MCG PO TABS: 150.0000 ug | ORAL_TABLET | Freq: Every day | ORAL | 3 refills | Status: DC

## 2016-07-01 NOTE — Patient Instructions (Addendum)
Call Dr. Amedeo Plenty- if any issues getting in let us know  Blood pressure looks great- continue losartan 50mg  and propranolol only for blood pressure. Let me know if you have dizziness. Makes ure to remain well hydrated  We will call you within a week about your referral to general surgery. If you do not hear within 2 weeks, give [place] a call.

## 2016-07-01 NOTE — Progress Notes (Signed)
Pre visit review using our clinic review tool, if applicable. No additional management support is needed unless otherwise documented below in the visit note. 

## 2016-07-01 NOTE — Assessment & Plan Note (Signed)
S: controlled on losartan 50mg  (down from 100mg ) and propranolol 10mg  BID for essential tremor. Now off amlodipine 5mg , hctz 25mg . Has not had dizzy episodes since stopping these BP Readings from Last 3 Encounters:  07/01/16 130/80  06/16/16 (!) 142/86  06/04/16 138/84  A/P:Continue current meds:  Doing much better

## 2016-07-01 NOTE — Progress Notes (Signed)
Subjective:  Wayne Booth is a 76 y.o. year old very pleasant male patient who presents for/with See problem oriented charting ROS- No chest pain or shortness of breath. No headache or blurry vision.    Past Medical History-  Patient Active Problem List   Diagnosis Date Noted  . 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 History of renal cell carcinoma 12/26/2007    Priority: High  . Cervical disc disease 11/13/2015    Priority: Medium  . Essential tremor 08/14/2015    Priority: Medium  . Gout 08/14/2015    Priority: Medium  . Hypothyroidism 07/12/2014    Priority: Medium  . Hyperglycemia 07/12/2014    Priority: Medium  . GAD (generalized anxiety disorder) 11/07/2013    Priority: Medium  . OSA (obstructive sleep apnea) 07/07/2011    Priority: Medium  . Morbid obesity ([place]) 10/02/2008    Priority: Medium  . Essential hypertension 12/28/2006    Priority: Medium  . Bronchospasm 06/09/2011    Priority: Low  . Actinic keratosis 11/27/2009    Priority: Low  . Osteoarthritis 11/13/2008    Priority: Low  . TESTOSTERONE DEFICIENCY 12/28/2006    Priority: Low    Medications- reviewed and updated Current Outpatient Prescriptions  Medication Sig Dispense Refill  . aspirin EC 81 MG tablet Take 81 mg by mouth daily.    Marland Kitchen escitalopram (LEXAPRO) 20 MG tablet Take 1 tablet (20 mg total) by mouth daily. TAKE 20 mg every day 90 tablet 3  . levofloxacin (LEVAQUIN) 750 MG tablet Take 1 tablet (750 mg total) by mouth daily. 5 tablet 0  . losartan (COZAAR) 50 MG tablet Take 1 tablet (50 mg total) by mouth daily. 90 tablet 3  . Multiple Vitamins-Minerals (MULTIVITAMINS THER. W/MINERALS) TABS Take 1 tablet by mouth daily.    . nortriptyline (PAMELOR) 75 MG capsule 75 mg at bedtime.    . propranolol (INDERAL) 10 MG tablet TAKE 1 TABLET (10 MG TOTAL) BY MOUTH 2 (TWO) TIMES DAILY. 60 tablet 3  . traZODone (DESYREL) 50 MG tablet TAKE 0.5-1 TABLETS (25-50 MG TOTAL) BY MOUTH AT  BEDTIME AS NEEDED FORSLEEP. 90 tablet 3  . triamcinolone cream (KENALOG) 0.1 % APPLY 1 APPLICATION TOPICALLY 2 (TWO) TIMES DAILY. FOR 1 WEEK MAX BEFORE AT LEAST A WEEK OFF- MAY RETRIAL AFTER THAT 80 g 0  . albuterol (PROVENTIL HFA;VENTOLIN HFA) 108 (90 Base) MCG/ACT inhaler Inhale 2 puffs into the lungs every 6 (six) hours as needed for wheezing or shortness of breath. 1 Inhaler 0  . levothyroxine (SYNTHROID, LEVOTHROID) 150 MCG tablet Take 1 tablet (150 mcg total) by mouth daily. 90 tablet 3   No current facility-administered medications for this visit.     Objective: BP 130/80 (BP Location: Left Arm, Patient Position: Sitting, Cuff Size: Large)   Pulse 73   Ht 5' 8.5" (1.74 m)   Wt 289 lb 3.2 oz (131.2 kg)   SpO2 95%   BMI 43.33 kg/m  Gen: NAD, resting comfortably Behind left ear and behind mastoid has a mobile 4-5 cm area that has a spongy texture and.  CV: RRR no murmurs rubs or gallops Lungs: CTAB no crackles, wheeze, rhonchi, Abdomen: soft/nontender/nondistended/normal bowel sounds. No rebound or guarding.  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 3-4 years ago noted it but growing over last few months. 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 It is not causing pain but patient wants to consider removal A/P: advised referral to general surgery for discussion of potential removal.   Essential hypertension S: controlled on losartan 50mg  (down from 100mg ) and propranolol 10mg  BID for essential tremor. Now off amlodipine 5mg , hctz 25mg . Has not had dizzy episodes since stopping these BP Readings from Last 3 Encounters:  07/01/16 130/80  06/16/16 (!) 142/86  06/04/16 138/84  A/P:Continue current meds:  Doing much better  return 6to 6 month schedule  Orders Placed This Encounter  Procedures  . Ambulatory referral to General Surgery    Referral  Priority:   Routine    Referral Type:   Surgical    Referral Reason:   Specialty Services Required    Requested Specialty:   General Surgery    Number of Visits Requested:   1   Return precautions advised.  [name], MD

## 2016-07-08 DIAGNOSIS — M79642 Pain in left hand: Secondary | ICD-10-CM | POA: Diagnosis not present

## 2016-07-08 DIAGNOSIS — M180 Bilateral primary osteoarthritis of first carpometacarpal joints: Secondary | ICD-10-CM | POA: Diagnosis not present

## 2016-07-08 DIAGNOSIS — M79641 Pain in right hand: Secondary | ICD-10-CM | POA: Diagnosis not present

## 2016-07-10 DIAGNOSIS — M180 Bilateral primary osteoarthritis of first carpometacarpal joints: Secondary | ICD-10-CM | POA: Diagnosis not present

## 2016-07-17 ENCOUNTER — Encounter: Payer: Self-pay | Admitting: *Deleted

## 2016-07-30 DIAGNOSIS — H40053 Ocular hypertension, bilateral: Secondary | ICD-10-CM | POA: Diagnosis not present

## 2016-07-30 DIAGNOSIS — Z961 Presence of intraocular lens: Secondary | ICD-10-CM | POA: Diagnosis not present

## 2016-08-03 ENCOUNTER — Encounter: Payer: Self-pay | Admitting: Internal Medicine

## 2016-08-10 ENCOUNTER — Telehealth: Payer: Self-pay | Admitting: Family Medicine

## 2016-08-10 ENCOUNTER — Other Ambulatory Visit: Payer: Self-pay

## 2016-08-10 MED ORDER — TRAZODONE HCL 50 MG PO TABS: ORAL_TABLET | ORAL | 3 refills | Status: DC

## 2016-08-10 NOTE — Telephone Encounter (Signed)
Pt needs refill on trazodone Licensed conveyancer

## 2016-08-10 NOTE — Telephone Encounter (Signed)
Prescription sent as requested.

## 2016-08-12 ENCOUNTER — Other Ambulatory Visit: Payer: Self-pay

## 2016-08-12 MED ORDER — ESCITALOPRAM OXALATE 20 MG PO TABS: 20.0000 mg | ORAL_TABLET | Freq: Every day | ORAL | 3 refills | Status: DC

## 2016-08-12 MED ORDER — LEVOTHYROXINE SODIUM 150 MCG PO TABS: 150.0000 ug | ORAL_TABLET | Freq: Every day | ORAL | 3 refills | Status: DC

## 2016-08-12 MED ORDER — PROPRANOLOL HCL 10 MG PO TABS: ORAL_TABLET | ORAL | 3 refills | Status: DC

## 2016-08-12 MED ORDER — LOSARTAN POTASSIUM 50 MG PO TABS: 50.0000 mg | ORAL_TABLET | Freq: Every day | ORAL | 3 refills | Status: DC

## 2016-08-18 DIAGNOSIS — M13132 Monoarthritis, not elsewhere classified, left wrist: Secondary | ICD-10-CM | POA: Diagnosis not present

## 2016-08-18 DIAGNOSIS — M1811 Unilateral primary osteoarthritis of first carpometacarpal joint, right hand: Secondary | ICD-10-CM | POA: Diagnosis not present

## 2016-08-18 DIAGNOSIS — M13141 Monoarthritis, not elsewhere classified, right hand: Secondary | ICD-10-CM | POA: Diagnosis not present

## 2016-08-18 DIAGNOSIS — M180 Bilateral primary osteoarthritis of first carpometacarpal joints: Secondary | ICD-10-CM | POA: Diagnosis not present

## 2016-08-18 DIAGNOSIS — M1812 Unilateral primary osteoarthritis of first carpometacarpal joint, left hand: Secondary | ICD-10-CM | POA: Diagnosis not present

## 2016-08-27 ENCOUNTER — Encounter: Payer: Self-pay | Admitting: Family Medicine

## 2016-08-27 ENCOUNTER — Ambulatory Visit (INDEPENDENT_AMBULATORY_CARE_PROVIDER_SITE_OTHER): Payer: Medicare Other | Admitting: Family Medicine

## 2016-08-27 VITALS — BP 128/68 | HR 83 | Temp 97.9°F | Ht 68.5 in | Wt 288.2 lb

## 2016-08-27 DIAGNOSIS — R21 Rash and other nonspecific skin eruption: Secondary | ICD-10-CM | POA: Diagnosis not present

## 2016-08-27 MED ORDER — CLOTRIMAZOLE-BETAMETHASONE 1-0.05 % EX CREA: 1.0000 "application " | TOPICAL_CREAM | Freq: Two times a day (BID) | CUTANEOUS | 1 refills | Status: DC

## 2016-08-27 NOTE — Progress Notes (Signed)
Subjective:  Wayne Booth is a 76 y.o. year old very pleasant male patient who presents for/with See problem oriented charting ROS- see ROS below in problem oriented charting. Also no fever, chills.    Past Medical History-  Patient Active Problem List   Diagnosis Date Noted  . 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 History of renal cell carcinoma 12/26/2007    Priority: High  . Cervical disc disease 11/13/2015    Priority: Medium  . Essential tremor 08/14/2015    Priority: Medium  . Gout 08/14/2015    Priority: Medium  . Hypothyroidism 07/12/2014    Priority: Medium  . Hyperglycemia 07/12/2014    Priority: Medium  . GAD (generalized anxiety disorder) 11/07/2013    Priority: Medium  . OSA (obstructive sleep apnea) 07/07/2011    Priority: Medium  . Morbid obesity ([place]) 10/02/2008    Priority: Medium  . Essential hypertension 12/28/2006    Priority: Medium  . Bronchospasm 06/09/2011    Priority: Low  . Actinic keratosis 11/27/2009    Priority: Low  . Osteoarthritis 11/13/2008    Priority: Low  . TESTOSTERONE DEFICIENCY 12/28/2006    Priority: Low    Medications- reviewed and updated Current Outpatient Prescriptions  Medication Sig Dispense Refill  . albuterol (PROVENTIL HFA;VENTOLIN HFA) 108 (90 Base) MCG/ACT inhaler Inhale 2 puffs into the lungs every 6 (six) hours as needed for wheezing or shortness of breath. 1 Inhaler 0  . aspirin EC 81 MG tablet Take 81 mg by mouth daily.    Marland Kitchen escitalopram (LEXAPRO) 20 MG tablet Take 1 tablet (20 mg total) by mouth daily. TAKE 20 mg every day 90 tablet 3  . levothyroxine (SYNTHROID, LEVOTHROID) 150 MCG tablet Take 1 tablet (150 mcg total) by mouth daily. 90 tablet 3  . losartan (COZAAR) 50 MG tablet Take 1 tablet (50 mg total) by mouth daily. 90 tablet 3  . Multiple Vitamins-Minerals (MULTIVITAMINS THER. W/MINERALS) TABS Take 1 tablet by mouth daily.    . nortriptyline (PAMELOR) 75 MG capsule 75 mg at bedtime.     . propranolol (INDERAL) 10 MG tablet TAKE 1 TABLET (10 MG TOTAL) BY MOUTH 2 (TWO) TIMES DAILY. 60 tablet 3  . traZODone (DESYREL) 50 MG tablet TAKE 0.5-1 TABLETS (25-50 MG TOTAL) BY MOUTH AT BEDTIME AS NEEDED FORSLEEP. 90 tablet 3  . triamcinolone cream (KENALOG) 0.1 % APPLY 1 APPLICATION TOPICALLY 2 (TWO) TIMES DAILY. FOR 1 WEEK MAX BEFORE AT LEAST A WEEK OFF- MAY RETRIAL AFTER THAT 80 g 0   No current facility-administered medications for this visit.     Objective: BP 128/68 (BP Location: Left Arm, Patient Position: Sitting, Cuff Size: Large)   Pulse 83   Temp 97.9 F (36.6 C) (Oral)   Ht 5' 8.5" (1.74 m)   Wt 288 lb 3.2 oz (130.7 kg)   SpO2 98%   BMI 43.18 kg/m  Gen: NAD, resting comfortably CV: regular rate Lungs: nonlabored Abdomen: obese Ext: no edema Skin: warm, dry In bilateral axilla has circular erythematous raised area with central clearning. Mildly painful when touched.   Assessment/Plan:  Rash S:Patient has had intermittent rash in underarms for years. Uses triamcinolone and seems to clear up then comes back. Over last year has been worse. No itching but feels a burning from the rawness in the area. Notes most recently it has been in circular shape and feels like it has been that way.  ROS-not ill appearing, no fever/chills. No new medications.  Not immunocompromised. No mucus membrane involvement.  A/P: Circular shape and raised border suggests possibly fungal. Area still appears inflamed so suspect steroid will also be needed. We opted to trial lotrisone combo cream. Slightly stronger steroid in this as well. Will extend course up to 10 days but possibly even 14 days for first trial. I think some of this is related to him being overweight and tight pressure in area- advised to try to elevate as able (but not do anything that will affect healing of left thumb after recent surgery). May trial without deodorant to see if contact element  Meds ordered this encounter   Medications  . clotrimazole-betamethasone (LOTRISONE) cream    Sig: Apply 1 application topically 2 (two) times daily. Use for 10 days maximum.    Dispense:  45 g    Refill:  1   Return precautions advised.  [name], MD

## 2016-08-27 NOTE — Patient Instructions (Signed)
Trial new cream that still has steroid in it but also has antifungal in it. Tube will say 10 days but for first time could use up to 14 days.   Try to let under arms air out as much as able when at home keeping at least right arm propped up  Consider not using deodorant  Follow up if not improving

## 2016-08-27 NOTE — Progress Notes (Signed)
Pre visit review using our clinic review tool, if applicable. No additional management support is needed unless otherwise documented below in the visit note. 

## 2016-09-02 DIAGNOSIS — M1812 Unilateral primary osteoarthritis of first carpometacarpal joint, left hand: Secondary | ICD-10-CM | POA: Diagnosis not present

## 2016-09-02 DIAGNOSIS — Z4789 Encounter for other orthopedic aftercare: Secondary | ICD-10-CM | POA: Diagnosis not present

## 2016-09-16 DIAGNOSIS — Z4789 Encounter for other orthopedic aftercare: Secondary | ICD-10-CM | POA: Diagnosis not present

## 2016-09-16 DIAGNOSIS — M1812 Unilateral primary osteoarthritis of first carpometacarpal joint, left hand: Secondary | ICD-10-CM | POA: Diagnosis not present

## 2016-09-23 DIAGNOSIS — Z4789 Encounter for other orthopedic aftercare: Secondary | ICD-10-CM | POA: Diagnosis not present

## 2016-09-23 DIAGNOSIS — M1812 Unilateral primary osteoarthritis of first carpometacarpal joint, left hand: Secondary | ICD-10-CM | POA: Diagnosis not present

## 2016-09-29 ENCOUNTER — Encounter: Payer: Self-pay | Admitting: Interventional Cardiology

## 2016-09-30 DIAGNOSIS — Z4789 Encounter for other orthopedic aftercare: Secondary | ICD-10-CM | POA: Diagnosis not present

## 2016-09-30 DIAGNOSIS — M1812 Unilateral primary osteoarthritis of first carpometacarpal joint, left hand: Secondary | ICD-10-CM | POA: Diagnosis not present

## 2016-10-08 DIAGNOSIS — M1812 Unilateral primary osteoarthritis of first carpometacarpal joint, left hand: Secondary | ICD-10-CM | POA: Diagnosis not present

## 2016-10-13 DIAGNOSIS — M1812 Unilateral primary osteoarthritis of first carpometacarpal joint, left hand: Secondary | ICD-10-CM | POA: Diagnosis not present

## 2016-10-15 ENCOUNTER — Encounter: Payer: Self-pay | Admitting: Family Medicine

## 2016-10-15 ENCOUNTER — Ambulatory Visit (INDEPENDENT_AMBULATORY_CARE_PROVIDER_SITE_OTHER): Payer: Medicare Other | Admitting: Family Medicine

## 2016-10-15 ENCOUNTER — Ambulatory Visit: Payer: Medicare Other | Admitting: Interventional Cardiology

## 2016-10-15 ENCOUNTER — Ambulatory Visit (INDEPENDENT_AMBULATORY_CARE_PROVIDER_SITE_OTHER)
Admission: RE | Admit: 2016-10-15 | Discharge: 2016-10-15 | Disposition: A | Discharge status: Home / Self Care | Payer: Medicare Other | Source: Ambulatory Visit | Attending: Family Medicine | Admitting: Family Medicine

## 2016-10-15 VITALS — BP 124/72 | HR 94 | Temp 100.3°F | Wt 287.6 lb

## 2016-10-15 DIAGNOSIS — R509 Fever, unspecified: Secondary | ICD-10-CM

## 2016-10-15 DIAGNOSIS — J209 Acute bronchitis, unspecified: Secondary | ICD-10-CM | POA: Diagnosis not present

## 2016-10-15 DIAGNOSIS — R05 Cough: Secondary | ICD-10-CM | POA: Diagnosis not present

## 2016-10-15 LAB — CBC WITH DIFFERENTIAL/PLATELET
BASOS PCT: 0.8 % (ref 0.0–3.0)
Basophils Absolute: 0.1 10*3/uL (ref 0.0–0.1)
EOS PCT: 5.3 % — AB (ref 0.0–5.0)
Eosinophils Absolute: 0.5 10*3/uL (ref 0.0–0.7)
HCT: 50.2 % (ref 39.0–52.0)
Hemoglobin: 17 g/dL (ref 13.0–17.0)
LYMPHS ABS: 1.3 10*3/uL (ref 0.7–4.0)
Lymphocytes Relative: 12.4 % (ref 12.0–46.0)
MCHC: 33.8 g/dL (ref 30.0–36.0)
MCV: 91.2 fl (ref 78.0–100.0)
MONO ABS: 1.2 10*3/uL — AB (ref 0.1–1.0)
MONOS PCT: 12.2 % — AB (ref 3.0–12.0)
NEUTROS PCT: 69.3 % (ref 43.0–77.0)
Neutro Abs: 7 10*3/uL (ref 1.4–7.7)
Platelets: 267 10*3/uL (ref 150.0–400.0)
RBC: 5.51 Mil/uL (ref 4.22–5.81)
RDW: 15.2 % (ref 11.5–15.5)
WBC: 10.2 10*3/uL (ref 4.0–10.5)

## 2016-10-15 LAB — COMPREHENSIVE METABOLIC PANEL
ALBUMIN: 4.2 g/dL (ref 3.5–5.2)
ALT: 48 U/L (ref 0–53)
AST: 35 U/L (ref 0–37)
Alkaline Phosphatase: 82 U/L (ref 39–117)
BUN: 13 mg/dL (ref 6–23)
CO2: 31 mEq/L (ref 19–32)
CREATININE: 1.23 mg/dL (ref 0.40–1.50)
Calcium: 9.2 mg/dL (ref 8.4–10.5)
Chloride: 98 mEq/L (ref 96–112)
GFR: 61.09 mL/min (ref >60.00)
GLUCOSE: 81 mg/dL (ref 70–99)
Potassium: 4.4 mEq/L (ref 3.5–5.1)
SODIUM: 134 meq/L — AB (ref 135–145)
TOTAL PROTEIN: 7.5 g/dL (ref 6.0–8.3)
Total Bilirubin: 0.7 mg/dL (ref 0.2–1.2)

## 2016-10-15 LAB — POC INFLUENZA A&B (BINAX/QUICKVUE)
INFLUENZA B, POC: NEGATIVE
Influenza A, POC: NEGATIVE

## 2016-10-15 MED ORDER — AMOXICILLIN-POT CLAVULANATE 875-125 MG PO TABS: 1.0000 | ORAL_TABLET | Freq: Two times a day (BID) | ORAL | 0 refills | Status: DC

## 2016-10-15 MED ORDER — ALBUTEROL SULFATE HFA 108 (90 BASE) MCG/ACT IN AERS: 2.0000 | INHALATION_SPRAY | Freq: Four times a day (QID) | RESPIRATORY_TRACT | 0 refills | Status: DC | PRN

## 2016-10-15 MED ORDER — GUAIFENESIN-CODEINE 100-10 MG/5ML PO SOLN: 5.0000 mL | Freq: Four times a day (QID) | ORAL | 0 refills | Status: DC | PRN

## 2016-10-15 MED ORDER — PREDNISONE 20 MG PO TABS: ORAL_TABLET | ORAL | 0 refills | Status: DC

## 2016-10-15 NOTE — Patient Instructions (Addendum)
Please go to [facility] - located [address] across the [address] from [place] - in the basement - Hours: 8:30-5:30 PM M-F. Do not need appointment.   Start augmentin now.  If this is bronchitis- antibiotics less likely to help but I would still continue it and I would add prednisone If this is pneumonia- would azithromycin (another antibiotic)  Cough medicine - take with you to pharmacy  If you get to feeling worse - particularly short of breath or if you were to pass out or dizziness were to worsen would seek care immediately. Keep pushing fluids  Please stop by lab before you go

## 2016-10-15 NOTE — Progress Notes (Signed)
PCP: [name], MD  Subjective:  Wayne Booth is a 76 y.o. year old very pleasant male patient who presents with  symptoms including nasal congestion,  cough, chest congestion, fatigue, low grade temperatures, and lightheadedness - does have wheeze as well -started: 5 days ago, symptoms are worsening in last 24 hours -previous treatments: rest and hydration- drinking at least 60 oz of fluid each day -sick contacts/travel/risks: deniesknown  flu exposure.  ROS-denies fever other than subjective warmth at home, NVD, tooth pain. Denies significant shortness of breath. No body aches  Pertinent Past Medical History-  Patient Active Problem List   Diagnosis Date Noted  . 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 History of renal cell carcinoma 12/26/2007    Priority: High  . Cervical disc disease 11/13/2015    Priority: Medium  . Essential tremor 08/14/2015    Priority: Medium  . Gout 08/14/2015    Priority: Medium  . Hypothyroidism 07/12/2014    Priority: Medium  . Hyperglycemia 07/12/2014    Priority: Medium  . GAD (generalized anxiety disorder) 11/07/2013    Priority: Medium  . OSA (obstructive sleep apnea) 07/07/2011    Priority: Medium  . Morbid obesity ([place]) 10/02/2008    Priority: Medium  . Essential hypertension 12/28/2006    Priority: Medium  . Bronchospasm 06/09/2011    Priority: Low  . Actinic keratosis 11/27/2009    Priority: Low  . Osteoarthritis 11/13/2008    Priority: Low  . TESTOSTERONE DEFICIENCY 12/28/2006    Priority: Low    Medications- reviewed  Current Outpatient Prescriptions  Medication Sig Dispense Refill  . albuterol (PROVENTIL HFA;VENTOLIN HFA) 108 (90 Base) MCG/ACT inhaler Inhale 2 puffs into the lungs every 6 (six) hours as needed for wheezing or shortness of breath. 1 Inhaler 0  . aspirin EC 81 MG tablet Take 81 mg by mouth daily.    . clotrimazole-betamethasone (LOTRISONE) cream Apply 1 application topically 2 (two)  times daily. Use for 10 days maximum. 45 g 1  . escitalopram (LEXAPRO) 20 MG tablet Take 1 tablet (20 mg total) by mouth daily. TAKE 20 mg every day 90 tablet 3  . levothyroxine (SYNTHROID, LEVOTHROID) 150 MCG tablet Take 1 tablet (150 mcg total) by mouth daily. 90 tablet 3  . losartan (COZAAR) 50 MG tablet Take 1 tablet (50 mg total) by mouth daily. 90 tablet 3  . Multiple Vitamins-Minerals (MULTIVITAMINS THER. W/MINERALS) TABS Take 1 tablet by mouth daily.    . nortriptyline (PAMELOR) 75 MG capsule 75 mg at bedtime.    . propranolol (INDERAL) 10 MG tablet TAKE 1 TABLET (10 MG TOTAL) BY MOUTH 2 (TWO) TIMES DAILY. 60 tablet 3  . traZODone (DESYREL) 50 MG tablet TAKE 0.5-1 TABLETS (25-50 MG TOTAL) BY MOUTH AT BEDTIME AS NEEDED FORSLEEP. 90 tablet 3  . triamcinolone cream (KENALOG) 0.1 % APPLY 1 APPLICATION TOPICALLY 2 (TWO) TIMES DAILY. FOR 1 WEEK MAX BEFORE AT LEAST A WEEK OFF- MAY RETRIAL AFTER THAT 80 g 0  . amoxicillin-clavulanate (AUGMENTIN) 875-125 MG tablet Take 1 tablet by mouth 2 (two) times daily. 14 tablet 0  . guaiFENesin-codeine 100-10 MG/5ML syrup Take 5 mLs by mouth every 6 (six) hours as needed for cough. 120 mL 0  . predniSONE (DELTASONE) 20 MG tablet Take 2 pills for 3 days, 1 pill for 4 days 10 tablet 0   No current facility-administered medications for this visit.     Objective: BP 124/72 (BP Location: Left Arm, Patient Position:  Sitting, Cuff Size: Large)   Pulse 94   Temp 100.3 F (37.9 C) (Oral)   Wt 287 lb 9.6 oz (130.5 kg)   SpO2 95%   BMI 43.09 kg/m  Gen: NAD, appears fatigued HEENT: Turbinates erythematous, TM normal, pharynx mildly erythematous with no tonsilar exudate or edema, no sinus tenderness CV: RRR no murmurs rubs or gallops Lungs: CTAB other than diffuse mild wheeze though no crackles, rhonchi . No respiratory distress Ext: no edema or calf tenderness Skin: warm, dry, no rash   Dg Chest 2 View  Result Date: 10/15/2016 CLINICAL DATA:  Cough, chest  congestion, fever and fatigue for the past 7 days, worsening. EXAM: CHEST  2 VIEW COMPARISON:  01/17/2015. FINDINGS: Borderline enlarged cardiac silhouette. Clear lungs. Stable mild prominence of the interstitial markings. Extensive thoracic spine degenerative changes. Stable small linear metallic foreign body overlying the left lateral chest inferiorly. Cholecystectomy clips. IMPRESSION: No acute abnormality. Stable mild chronic interstitial lung disease. Electronically Signed   By: Claudie Revering M.D.   On: 10/15/2016 17:58    Results for orders placed or performed in visit on 10/15/16 (from the past 24 hour(s))  POC Influenza A&B ([facility] test)     Status: None   Collection Time: 10/15/16  1:36 PM  Result Value Ref Range   Influenza A, POC Negative Negative   Influenza B, POC Negative Negative  CBC with Differential/Platelet     Status: Abnormal   Collection Time: 10/15/16  2:01 PM  Result Value Ref Range   WBC 10.2 4.0 - 10.5 K/uL   RBC 5.51 4.22 - 5.81 Mil/uL   Hemoglobin 17.0 13.0 - 17.0 g/dL   HCT 50.2 39.0 - 52.0 %   MCV 91.2 78.0 - 100.0 fl   MCHC 33.8 30.0 - 36.0 g/dL   RDW 15.2 11.5 - 15.5 %   Platelets 267.0 150.0 - 400.0 K/uL   Neutrophils Relative % 69.3 43.0 - 77.0 %   Lymphocytes Relative 12.4 12.0 - 46.0 %   Monocytes Relative 12.2 (H) 3.0 - 12.0 %   Eosinophils Relative 5.3 (H) 0.0 - 5.0 %   Basophils Relative 0.8 0.0 - 3.0 %   Neutro Abs 7.0 1.4 - 7.7 K/uL   Lymphs Abs 1.3 0.7 - 4.0 K/uL   Monocytes Absolute 1.2 (H) 0.1 - 1.0 K/uL   Eosinophils Absolute 0.5 0.0 - 0.7 K/uL   Basophils Absolute 0.1 0.0 - 0.1 K/uL  Comprehensive metabolic panel     Status: Abnormal   Collection Time: 10/15/16  2:01 PM  Result Value Ref Range   Sodium 134 (L) 135 - 145 mEq/L   Potassium 4.4 3.5 - 5.1 mEq/L   Chloride 98 96 - 112 mEq/L   CO2 31 19 - 32 mEq/L   Glucose, Bld 81 70 - 99 mg/dL   BUN 13 6 - 23 mg/dL   Creatinine, Ser 1.23 0.40 - 1.50 mg/dL   Total Bilirubin 0.7 0.2 - 1.2  mg/dL   Alkaline Phosphatase 82 39 - 117 U/L   AST 35 0 - 37 U/L   ALT 48 0 - 53 U/L   Total Protein 7.5 6.0 - 8.3 g/dL   Albumin 4.2 3.5 - 5.2 g/dL   Calcium 9.2 8.4 - 10.5 mg/dL   GFR 61.09 >60.00 mL/min    Assessment/Plan:  Bronchitis History and x-ray  and exam today are suggestive of viral infection most likely due to bronchitis. Symptomatic treatment with: albuterol and prednisone as well as codeine cough  syrup (should not drive for 8 hours after using) I am very concerned with patients age, unilateral kidney history, morbid obesity, OSA about the risk for more singificant disease. Although bronchitis likely viral- we did opt to cover with augmentin. Had considered azithromycin if pneumonia on films. Labs done largely reassuring as well  Considered flu with temperature 100.3 but flu test negative and flu in the community has nto been as prevalent in recent weeks. Also no body aches- more the cough and chest congestion his main concerns.   We discussed that we did not find any infection that had higher probability of being bacterial such as pneumonia, strep throat, ear infection, bacterial sinusitis. We discussed signs that bacterial infection may have developed particularly higher fever or shortness of breath and reasons for follow up (symptoms worsen, last past expected time frame, new concerns arise).  Likely course of 3-6 weeks. Patient is contagious and advised good handwashing and consideration of mask If going to be in public places.   For lightheaded portion- we will call patient to have him hold losartan for now.   Meds ordered this encounter  Medications  . amoxicillin-clavulanate (AUGMENTIN) 875-125 MG tablet    Sig: Take 1 tablet by mouth 2 (two) times daily.    Dispense:  14 tablet    Refill:  0  . guaiFENesin-codeine 100-10 MG/5ML syrup    Sig: Take 5 mLs by mouth every 6 (six) hours as needed for cough.    Dispense:  120 mL    Refill:  0  . albuterol (PROVENTIL  HFA;VENTOLIN HFA) 108 (90 Base) MCG/ACT inhaler    Sig: Inhale 2 puffs into the lungs every 6 (six) hours as needed for wheezing or shortness of breath.    Dispense:  1 Inhaler    Refill:  0  . predniSONE (DELTASONE) 20 MG tablet    Sig: Take 2 pills for 3 days, 1 pill for 4 days    Dispense:  10 tablet    Refill:  0    [name], MD

## 2016-10-15 NOTE — Progress Notes (Signed)
Pre visit review using our clinic review tool, if applicable. No additional management support is needed unless otherwise documented below in the visit note. 

## 2016-10-16 ENCOUNTER — Telehealth: Payer: Self-pay | Admitting: Family Medicine

## 2016-10-16 NOTE — Telephone Encounter (Signed)
Called patient back and left a voicemail asking for a return phone call

## 2016-10-16 NOTE — Telephone Encounter (Signed)
Pts wife would like to have a call back.

## 2016-10-19 ENCOUNTER — Ambulatory Visit (INDEPENDENT_AMBULATORY_CARE_PROVIDER_SITE_OTHER): Payer: Medicare Other | Admitting: Family Medicine

## 2016-10-19 ENCOUNTER — Encounter: Payer: Self-pay | Admitting: Family Medicine

## 2016-10-19 VITALS — BP 130/76 | HR 86 | Temp 98.0°F | Ht 68.5 in | Wt 283.2 lb

## 2016-10-19 DIAGNOSIS — R0602 Shortness of breath: Secondary | ICD-10-CM | POA: Diagnosis not present

## 2016-10-19 MED ORDER — ALBUTEROL SULFATE (2.5 MG/3ML) 0.083% IN NEBU
2.5000 mg | INHALATION_SOLUTION | Freq: Once | RESPIRATORY_TRACT | Status: AC
  Administered 2016-10-19: 2.5 mg via RESPIRATORY_TRACT

## 2016-10-19 MED ORDER — LEVOFLOXACIN 500 MG PO TABS: 500.0000 mg | ORAL_TABLET | Freq: Every day | ORAL | 0 refills | Status: DC

## 2016-10-19 NOTE — Patient Instructions (Addendum)
Please stop by lab before you go  We will call you tomorrow about yoru CT scan  Stop Augmentin. Start levaquin (most aggressive I can be with outpatient antibiotics)  You declined going to the emergency room today which was my recommendation. You did agree if new or worsening symptoms to call 911

## 2016-10-19 NOTE — Progress Notes (Signed)
Pre visit review using our clinic review tool, if applicable. No additional management support is needed unless otherwise documented below in the visit note. 

## 2016-10-19 NOTE — Progress Notes (Signed)
Subjective:  Wayne Booth is a 76 y.o. year old very pleasant male patient who presents for/with See problem oriented charting ROS- denies chest pain. Feels very winded. Fevers have broken for a few days.  Very tired.   Past Medical History-  Patient Active Problem List   Diagnosis Date Noted  . 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 History of renal cell carcinoma 12/26/2007    Priority: High  . Cervical disc disease 11/13/2015    Priority: Medium  . Essential tremor 08/14/2015    Priority: Medium  . Gout 08/14/2015    Priority: Medium  . Hypothyroidism 07/12/2014    Priority: Medium  . Hyperglycemia 07/12/2014    Priority: Medium  . GAD (generalized anxiety disorder) 11/07/2013    Priority: Medium  . OSA (obstructive sleep apnea) 07/07/2011    Priority: Medium  . Morbid obesity ([place]) 10/02/2008    Priority: Medium  . Essential hypertension 12/28/2006    Priority: Medium  . Bronchospasm 06/09/2011    Priority: Low  . Actinic keratosis 11/27/2009    Priority: Low  . Osteoarthritis 11/13/2008    Priority: Low  . TESTOSTERONE DEFICIENCY 12/28/2006    Priority: Low    Medications- reviewed and updated Current Outpatient Prescriptions  Medication Sig Dispense Refill  . albuterol (PROVENTIL HFA;VENTOLIN HFA) 108 (90 Base) MCG/ACT inhaler Inhale 2 puffs into the lungs every 6 (six) hours as needed for wheezing or shortness of breath. 1 Inhaler 0  . amoxicillin-clavulanate (AUGMENTIN) 875-125 MG tablet Take 1 tablet by mouth 2 (two) times daily. 14 tablet 0  . aspirin EC 81 MG tablet Take 81 mg by mouth daily.    . clotrimazole-betamethasone (LOTRISONE) cream Apply 1 application topically 2 (two) times daily. Use for 10 days maximum. 45 g 1  . escitalopram (LEXAPRO) 20 MG tablet Take 1 tablet (20 mg total) by mouth daily. TAKE 20 mg every day 90 tablet 3  . guaiFENesin-codeine 100-10 MG/5ML syrup Take 5 mLs by mouth every 6 (six) hours as needed for cough.  120 mL 0  . levothyroxine (SYNTHROID, LEVOTHROID) 150 MCG tablet Take 1 tablet (150 mcg total) by mouth daily. 90 tablet 3  . losartan (COZAAR) 50 MG tablet Take 1 tablet (50 mg total) by mouth daily. 90 tablet 3  . Multiple Vitamins-Minerals (MULTIVITAMINS THER. W/MINERALS) TABS Take 1 tablet by mouth daily.    . nortriptyline (PAMELOR) 75 MG capsule 75 mg at bedtime.    . predniSONE (DELTASONE) 20 MG tablet Take 2 pills for 3 days, 1 pill for 4 days 10 tablet 0  . propranolol (INDERAL) 10 MG tablet TAKE 1 TABLET (10 MG TOTAL) BY MOUTH 2 (TWO) TIMES DAILY. 60 tablet 3  . traZODone (DESYREL) 50 MG tablet TAKE 0.5-1 TABLETS (25-50 MG TOTAL) BY MOUTH AT BEDTIME AS NEEDED FORSLEEP. 90 tablet 3  . triamcinolone cream (KENALOG) 0.1 % APPLY 1 APPLICATION TOPICALLY 2 (TWO) TIMES DAILY. FOR 1 WEEK MAX BEFORE AT LEAST A WEEK OFF- MAY RETRIAL AFTER THAT 80 g 0  . levofloxacin (LEVAQUIN) 500 MG tablet Take 1 tablet (500 mg total) by mouth daily. 7 tablet 0   No current facility-administered medications for this visit.     Objective: BP 130/76 (BP Location: Left Arm, Patient Position: Sitting, Cuff Size: Large)   Pulse 86   Temp 98 F (36.7 C) (Oral)   Ht 5' 8.5" (1.74 m)   Wt 283 lb 3.2 oz (128.5 kg)   SpO2 95%  BMI 42.43 kg/m  Gen: appears uncomfortable at rest with high respiratory rate and audible wheese Oropharynx normal.  CV: RRR no murmurs rubs or gallops Lungs: Diffuse rhonchi and wheeze. No crackles. Respiratory rate approximately 20. With walking saturations vary from 91 to 95%.  Abdomen: soft/nontender/nondistended/normal bowel sounds. Morbid obesity Ext: no edema Skin: warm, dry Neuro: grossly normal, moves all extremities  EKG: sinus rhythm. Rate 86. Normal intervals. No hypertrophy. q waves in v1 and v2 unchanged from previous. No other st or t wave changes. Some movement with exam  Assessment/Plan:  Shortness of breath - Plan: EKG 12-Lead, albuterol (PROVENTIL) (2.5 MG/3ML)  0.083% nebulizer solution 2.5 mg, CBC with Differential/Platelet, Comprehensive metabolic panel, Brain Natriuretic Peptide, CT Angio Chest W/Cm &/Or Wo Cm S: Patient seen 4 days ago and diagnosed with bronchitis due to diffuse wheeze and x-ray without pneumonia. He had a low grade temperature but denied significant shortness of breath. Did have some lightheadedness at that time. We discussed with his age and comorbidities would cover for bacterial infection though likely viral. We also used prednisone and albuterol. Albuterol he has been using every 2-3 hours in last day or two with only mild relief. He is worried that he will go to sleep and "not wake up" and he is worried about his difficulty breathing primarily- though he is no longer having fever- he does feel more short of breath. He gets very winded just walking in from his car or getting up on the table which is new for him. He has held losartan as instructed and BP better..  A/P:  I strongly advised patient with shortness of breath and oxygen level hanging out in low 90s to go to the emergency room. He firmly declined despite knowing risk of death due to his concern of waiting in the emergency room for hours then being sent home. I discussed with him risks of pulmonary or cardiac events. Since he declined this- we did opt to advance to levaquin (qt was ok on ekg) as well as get CT angiogram. I still think bronchitis is the most likely cause but want to rule out other causes and CT angio would rule out PE, pna not apparent on x-ray, mass. Also will repea tlabs and get BNP though doubt heart failure. Had referred to cardiology several months ago- may need to get him plugged back in- once again on other hand could primarily just be viral bronchitis in elderly patient with morbid obesity. Patient did feel better after nebulizer.   Determine follow up based on imaging.   Orders Placed This Encounter  Procedures  . CT Angio Chest W/Cm &/Or Wo Cm    SS.  Debra [phone] XT 2251/ [employer]/ Not Diabetic/ BUN 13 CRE 1.23 GFR 57    Standing Status:   Future    Standing Expiration Date:   01/18/2018    Order Specific Question:   If indicated for the ordered procedure, I authorize the administration of contrast media per Radiology protocol    Answer:   Yes    Order Specific Question:   Reason for Exam (SYMPTOM  OR DIAGNOSIS REQUIRED)    Answer:   shortness of breath. Presumed bronchitis. oxygen as low as 91% in office.    Order Specific Question:   Preferred imaging location?    Answer:   [place]    Order Specific Question:   Radiology Contrast Protocol - do NOT remove file path    Answer:   [email or web address]  .  CBC with Differential/Platelet  . Comprehensive metabolic panel    [place]  . Brain Natriuretic Peptide  . EKG 12-Lead    Order Specific Question:   Where should this test be performed    Answer:   Other    Meds ordered this encounter  Medications  . albuterol (PROVENTIL) (2.5 MG/3ML) 0.083% nebulizer solution 2.5 mg  . levofloxacin (LEVAQUIN) 500 MG tablet    Sig: Take 1 tablet (500 mg total) by mouth daily.    Dispense:  7 tablet    Refill:  0   The duration of face-to-face time during this visit was greater than 25 minutes. Greater than 50% of this time was spent in counseling, explanation of diagnosis, planning of further management, and/or coordination of care including primarily risks for life threatening disease, reasoning for changing antibiotic as well as recognition that this could be viral still, discussion of reasons to call 911, need to go to ER at present and discussing patient declining this.   Return precautions advised.  [name], MD

## 2016-10-19 NOTE — Telephone Encounter (Signed)
Pt aware to be her at 3:15 today

## 2016-10-19 NOTE — Telephone Encounter (Signed)
Pt states he is still coughing,wheezing, and very weak.  Would like to see Dr Yong Channel again today as soon as possible  please advise

## 2016-10-20 ENCOUNTER — Telehealth: Payer: Self-pay | Admitting: Family Medicine

## 2016-10-20 ENCOUNTER — Ambulatory Visit (INDEPENDENT_AMBULATORY_CARE_PROVIDER_SITE_OTHER)
Admission: RE | Admit: 2016-10-20 | Discharge: 2016-10-20 | Disposition: A | Discharge status: Home / Self Care | Payer: Medicare Other | Source: Ambulatory Visit | Attending: Family Medicine | Admitting: Family Medicine

## 2016-10-20 DIAGNOSIS — M1812 Unilateral primary osteoarthritis of first carpometacarpal joint, left hand: Secondary | ICD-10-CM | POA: Diagnosis not present

## 2016-10-20 DIAGNOSIS — R0602 Shortness of breath: Secondary | ICD-10-CM | POA: Diagnosis not present

## 2016-10-20 DIAGNOSIS — I2699 Other pulmonary embolism without acute cor pulmonale: Secondary | ICD-10-CM

## 2016-10-20 LAB — COMPREHENSIVE METABOLIC PANEL
ALK PHOS: 63 U/L (ref 39–117)
ALT: 63 U/L — ABNORMAL HIGH (ref 0–53)
AST: 40 U/L — ABNORMAL HIGH (ref 0–37)
Albumin: 3.9 g/dL (ref 3.5–5.2)
BUN: 23 mg/dL (ref 6–23)
CO2: 29 mEq/L (ref 19–32)
Calcium: 9.1 mg/dL (ref 8.4–10.5)
Chloride: 98 mEq/L (ref 96–112)
Creatinine, Ser: 1.19 mg/dL (ref 0.40–1.50)
GFR: 63.46 mL/min (ref >60.00)
GLUCOSE: 122 mg/dL — AB (ref 70–99)
POTASSIUM: 4.4 meq/L (ref 3.5–5.1)
Sodium: 134 mEq/L — ABNORMAL LOW (ref 135–145)
TOTAL PROTEIN: 7 g/dL (ref 6.0–8.3)
Total Bilirubin: 0.5 mg/dL (ref 0.2–1.2)

## 2016-10-20 LAB — CBC WITH DIFFERENTIAL/PLATELET
BASOS PCT: 0.4 % (ref 0.0–3.0)
Basophils Absolute: 0 10*3/uL (ref 0.0–0.1)
EOS ABS: 0.1 10*3/uL (ref 0.0–0.7)
Eosinophils Relative: 1.5 % (ref 0.0–5.0)
HCT: 47.5 % (ref 39.0–52.0)
HEMOGLOBIN: 16.4 g/dL (ref 13.0–17.0)
Lymphocytes Relative: 20.6 % (ref 12.0–46.0)
Lymphs Abs: 1.8 10*3/uL (ref 0.7–4.0)
MCHC: 34.6 g/dL (ref 30.0–36.0)
MCV: 90.7 fl (ref 78.0–100.0)
Monocytes Absolute: 0.9 10*3/uL (ref 0.1–1.0)
Monocytes Relative: 9.7 % (ref 3.0–12.0)
Neutro Abs: 6 10*3/uL (ref 1.4–7.7)
Neutrophils Relative %: 67.8 % (ref 43.0–77.0)
Platelets: 301 10*3/uL (ref 150.0–400.0)
RBC: 5.23 Mil/uL (ref 4.22–5.81)
RDW: 14.8 % (ref 11.5–15.5)
WBC: 8.8 10*3/uL (ref 4.0–10.5)

## 2016-10-20 LAB — BRAIN NATRIURETIC PEPTIDE: PRO B NATRI PEPTIDE: 30 pg/mL (ref 0.0–100.0)

## 2016-10-20 MED ORDER — IOPAMIDOL (ISOVUE-370) INJECTION 76%
80.0000 mL | Freq: Once | INTRAVENOUS | Status: AC | PRN
  Administered 2016-10-20: 80 mL via INTRAVENOUS

## 2016-10-20 MED ORDER — RIVAROXABAN (XARELTO) VTE STARTER PACK (15 & 20 MG): ORAL_TABLET | ORAL | 0 refills | Status: DC

## 2016-10-20 NOTE — Telephone Encounter (Signed)
Spoke with radiologist. He thinks pulmonary embolism in the lungs likely resolving.   That being said- patient is still very short of breath thoguh improving on levaquin.   We discussed this - patient would prefer to start xarelto. Printed script to take to pharmacy- encouraged him to go right away to make sure insurance covers this  We will get venous duplex to make sure no clots in the leg. Hoping to get this by the end of the week.   Luckily patient appears to be in less distress breathing today- rate is better, less wheeze. I advised a follow up with me either Friday of this week or Monday or Tuesday of next week.

## 2016-10-21 ENCOUNTER — Telehealth: Payer: Self-pay

## 2016-10-21 ENCOUNTER — Ambulatory Visit (HOSPITAL_COMMUNITY)
Admission: RE | Admit: 2016-10-21 | Discharge: 2016-10-21 | Disposition: A | Discharge status: Home / Self Care | Payer: Medicare Other | Source: Ambulatory Visit | Attending: Cardiovascular Disease | Admitting: Cardiovascular Disease

## 2016-10-21 DIAGNOSIS — Z720 Tobacco use: Secondary | ICD-10-CM | POA: Insufficient documentation

## 2016-10-21 DIAGNOSIS — I2699 Other pulmonary embolism without acute cor pulmonale: Secondary | ICD-10-CM

## 2016-10-21 DIAGNOSIS — I10 Essential (primary) hypertension: Secondary | ICD-10-CM | POA: Insufficient documentation

## 2016-10-21 NOTE — Telephone Encounter (Signed)
Dominica Severin @ [facility] called to report on STAT DVT scan. Scan is NEGATIVE for DVT.  Spoke with Dr. Yong Channel and advised. Nothing further needed.

## 2016-10-22 ENCOUNTER — Telehealth: Payer: Self-pay | Admitting: Family Medicine

## 2016-10-22 NOTE — Telephone Encounter (Signed)
Called and spoke with patient who wasn't sure if Dr. Yong Channel would schedule the echo or the cardiologist would schedule it when he goes in on Monday? Patient does have an appointment scheduled with Dr. Yong Channel tomorrow.

## 2016-10-22 NOTE — Telephone Encounter (Signed)
Pt does not have any blood clots  in his legs. Pt would like to proceed with  echo sound test. Pt is trying to get test done today and does have an appt with dr hunter tomorrow. Pt would like jamie to return his call. Pt was seen on 10-19-16

## 2016-10-22 NOTE — Telephone Encounter (Signed)
Likely cardiology to order- we can discuss tomorrow

## 2016-10-23 ENCOUNTER — Ambulatory Visit (INDEPENDENT_AMBULATORY_CARE_PROVIDER_SITE_OTHER): Payer: Medicare Other | Admitting: Family Medicine

## 2016-10-23 ENCOUNTER — Encounter: Payer: Self-pay | Admitting: Family Medicine

## 2016-10-23 VITALS — BP 112/68 | HR 92 | Temp 98.5°F | Ht 68.5 in | Wt 283.6 lb

## 2016-10-23 DIAGNOSIS — I2699 Other pulmonary embolism without acute cor pulmonale: Secondary | ICD-10-CM | POA: Diagnosis not present

## 2016-10-23 DIAGNOSIS — J209 Acute bronchitis, unspecified: Secondary | ICD-10-CM

## 2016-10-23 DIAGNOSIS — I2782 Chronic pulmonary embolism: Secondary | ICD-10-CM | POA: Insufficient documentation

## 2016-10-23 MED ORDER — GUAIFENESIN-CODEINE 100-10 MG/5ML PO SOLN
5.0000 mL | Freq: Four times a day (QID) | ORAL | 0 refills | Status: DC | PRN
Start: 2016-10-23 — End: 2016-11-27

## 2016-10-23 NOTE — Patient Instructions (Signed)
Refill cough medicine  Take the 15mg  dose TWICE a day for 3 weeks. Then change to once a day 20mg .   See me in 2-3 weeks. HAPPY to see you sooner if needed and dont hesitate to call with questions  Glad you are improving  We will call you within a week or two about your referral to ultrasound of the arms. If you do not hear within 3 weeks, give [place] a call.

## 2016-10-23 NOTE — Progress Notes (Signed)
Subjective:  Wayne Booth is a 76 y.o. year old very pleasant male patient who presents for/with See problem oriented charting ROS- shortness of breath improving. No chest pain. No edema in arms or legs. Still with cough. Still with wheeze   Past Medical History-  Patient Active Problem List   Diagnosis Date Noted  . Pulmonary embolus ([place]) 10/23/2016    Priority: High  . 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 History of renal cell carcinoma 12/26/2007    Priority: High  . Cervical disc disease 11/13/2015    Priority: Medium  . Essential tremor 08/14/2015    Priority: Medium  . Gout 08/14/2015    Priority: Medium  . Hypothyroidism 07/12/2014    Priority: Medium  . Hyperglycemia 07/12/2014    Priority: Medium  . GAD (generalized anxiety disorder) 11/07/2013    Priority: Medium  . OSA (obstructive sleep apnea) 07/07/2011    Priority: Medium  . Morbid obesity ([place]) 10/02/2008    Priority: Medium  . Essential hypertension 12/28/2006    Priority: Medium  . Bronchospasm 06/09/2011    Priority: Low  . Actinic keratosis 11/27/2009    Priority: Low  . Osteoarthritis 11/13/2008    Priority: Low  . TESTOSTERONE DEFICIENCY 12/28/2006    Priority: Low    Medications- reviewed and updated Current Outpatient Prescriptions  Medication Sig Dispense Refill  . albuterol (PROVENTIL HFA;VENTOLIN HFA) 108 (90 Base) MCG/ACT inhaler Inhale 2 puffs into the lungs every 6 (six) hours as needed for wheezing or shortness of breath. 1 Inhaler 0  . clotrimazole-betamethasone (LOTRISONE) cream Apply 1 application topically 2 (two) times daily. Use for 10 days maximum. 45 g 1  . escitalopram (LEXAPRO) 20 MG tablet Take 1 tablet (20 mg total) by mouth daily. TAKE 20 mg every day 90 tablet 3  . guaiFENesin-codeine 100-10 MG/5ML syrup Take 5 mLs by mouth every 6 (six) hours as needed for cough. 120 mL 0  . levofloxacin (LEVAQUIN) 500 MG tablet Take 1 tablet (500 mg total) by mouth  daily. 7 tablet 0  . levothyroxine (SYNTHROID, LEVOTHROID) 150 MCG tablet Take 1 tablet (150 mcg total) by mouth daily. 90 tablet 3  . losartan (COZAAR) 50 MG tablet Take 1 tablet (50 mg total) by mouth daily. 90 tablet 3  . Multiple Vitamins-Minerals (MULTIVITAMINS THER. W/MINERALS) TABS Take 1 tablet by mouth daily.    . nortriptyline (PAMELOR) 75 MG capsule 75 mg at bedtime.    . predniSONE (DELTASONE) 20 MG tablet Take 2 pills for 3 days, 1 pill for 4 days 10 tablet 0  . propranolol (INDERAL) 10 MG tablet TAKE 1 TABLET (10 MG TOTAL) BY MOUTH 2 (TWO) TIMES DAILY. 60 tablet 3  . Rivaroxaban 15 & 20 MG TBPK Take as directed on package: Start with one 15mg  tablet by mouth twice a day with food. On Day 22, switch to one 20mg  tablet once a day with food. 51 each 0  . traZODone (DESYREL) 50 MG tablet TAKE 0.5-1 TABLETS (25-50 MG TOTAL) BY MOUTH AT BEDTIME AS NEEDED FORSLEEP. 90 tablet 3  . triamcinolone cream (KENALOG) 0.1 % APPLY 1 APPLICATION TOPICALLY 2 (TWO) TIMES DAILY. FOR 1 WEEK MAX BEFORE AT LEAST A WEEK OFF- MAY RETRIAL AFTER THAT 80 g 0   No current facility-administered medications for this visit.     Objective: BP 112/68 (BP Location: Left Arm, Patient Position: Sitting, Cuff Size: Large)   Pulse 92   Temp 98.5 F (36.9 C) (  Oral)   Ht 5' 8.5" (1.74 m)   Wt 283 lb 9.6 oz (128.6 kg)   SpO2 93%   BMI 42.49 kg/m  Gen: NAD, resting comfortably CV: RRR no murmurs rubs or gallops Lungs: diffuse wheeze and rhonchi. Improved respiratory rate.  Abdomen: soft/nontender/nondistended/normal bowel sounds. obese Ext: no edema Skin: warm, dry  Oxygen saturations 93-95% when I tested   Assessment/Plan:  Acute bronchitis S:  able to get on table without getting short of breath. Gets in coughing fits when gets up and moving and feels winded but much better. He thinks he is improving daily. Wheezing and breathing issues at rest largely resolved. Albuterol still helping some. Cough medicine  most helpful at night and asks for refill A/P: we discussed bronchitis portion likely to last 4-6 weeks unfortunately- he would prefer to be done with the wheezing and cough but is thankful shortness of breath and coughing fits at rest are much improved.   Pulmonary embolus ([place]) S: Diagnosed on 10/20/16. Venous duplex is normal of legs. Started on xarelto. Unfortunately, patient has been taking only once a day- we reviewed today to take twice a day. He was supposed to pick up starter pack but they were not at the pharmacy so he received 15mg  samples from our office.  A/P:6 months of therapy planned. We went over 3 weeks dosing at 15 mg of xarelto then change to 20mg  dosing. Due to prior confusion also advised 2-3 week follow up to make sure he understands instructions.   This may have been provoked. About 12 weeks ago with thumb surgery. Hard cast 6 weeks. Will get duplex Upper extremities as well but dont have strong suspicion.   Will have discussion at next visit about hematology visit if upper extremity DVT scan negative. Would still be odd to have multiple emboli from upper extremity so may get their opinion regardless.   On other hand 75 and no history of embolism/DVT- could consider 6 month treatment only and lifelong therapy if recurrent. He does pretty firmly want repeat CT scan before stopping medication.   2-3 weeks. Sooner if needed.   Meds ordered this encounter  Medications  . guaiFENesin-codeine 100-10 MG/5ML syrup    Sig: Take 5 mLs by mouth every 6 (six) hours as needed for cough.    Dispense:  120 mL    Refill:  0    Return precautions advised. Also see avs.  [name], MD

## 2016-10-23 NOTE — Assessment & Plan Note (Signed)
S: Diagnosed on 10/20/16. Venous duplex is normal of legs. Started on xarelto. Unfortunately, patient has been taking only once a day- we reviewed today to take twice a day. He was supposed to pick up starter pack but they were not at the pharmacy so he received 15mg  samples from our office.  A/P:6 months of therapy planned. We went over 3 weeks dosing at 15 mg of xarelto then change to 20mg  dosing. Due to prior confusion also advised 2-3 week follow up to make sure he understands instructions.   This may have been provoked. About 12 weeks ago with thumb surgery. Hard cast 6 weeks. Will get duplex Upper extremities as well but dont have strong suspicion.   Will have discussion at next visit about hematology visit if upper extremity DVT scan negative. Would still be odd to have multiple emboli from upper extremity so may get their opinion regardless.   On other hand 75 and no history of embolism/DVT- could consider 6 month treatment only and lifelong therapy if recurrent. He does pretty firmly want repeat CT scan before stopping medication.

## 2016-10-23 NOTE — Progress Notes (Signed)
Pre visit review using our clinic review tool, if applicable. No additional management support is needed unless otherwise documented below in the visit note. 

## 2016-10-26 ENCOUNTER — Ambulatory Visit: Payer: Medicare Other | Admitting: Family Medicine

## 2016-10-26 ENCOUNTER — Encounter: Payer: Self-pay | Admitting: Interventional Cardiology

## 2016-10-26 ENCOUNTER — Ambulatory Visit (INDEPENDENT_AMBULATORY_CARE_PROVIDER_SITE_OTHER): Payer: Medicare Other | Admitting: Interventional Cardiology

## 2016-10-26 VITALS — BP 118/76 | HR 72 | Wt 289.2 lb

## 2016-10-26 DIAGNOSIS — I2699 Other pulmonary embolism without acute cor pulmonale: Secondary | ICD-10-CM | POA: Diagnosis not present

## 2016-10-26 DIAGNOSIS — R062 Wheezing: Secondary | ICD-10-CM

## 2016-10-26 DIAGNOSIS — I10 Essential (primary) hypertension: Secondary | ICD-10-CM | POA: Diagnosis not present

## 2016-10-26 NOTE — Progress Notes (Signed)
Cardiology Office Note   Date:  10/26/2016   ID:  Wayne Booth, Wayne Booth 04-21-1941, MRN [id number]  PCP:  [name], MD    Chief Complaint  Patient presents with  . Office Visit  Dizziness, abnormal ECG, SHOB   Wt Readings from Last 3 Encounters:  10/26/16 289 lb 4 oz (131.2 kg)  10/23/16 283 lb 9.6 oz (128.6 kg)  10/19/16 283 lb 3.2 oz (128.5 kg)       History of Present Illness: Wayne Booth is a 76 y.o. male  With obesity and OSA.  He has had HTN as well.  He has had dizziness intermittently for years, but it got worse in early Dec 2017.  His BP meds were reduced and he has had no further episodes.  Dizziness would occur would when going from sitting to standing.  No syncope.    He had some chest pain with his most severe dizzy episode in early December.  It lasted a few minutes.  It was a pressure feeling.  It resolved but he felt tired that day.    He had a cath in 4/08 that was normal.    He has had wheezing with activity. He had asthma as a child but this has resolved. His wife feels that his exertional issues are related to his weight. He had lost weight several years ago, but gained it back.  Strugglinfg to lose weight these days.  Her had hand surgery and then a few weeks later, was diagnosed with PE.  He was started on Xarelto.  He cointinues to report SHOB, weakness and wheezing.    Past Medical History:  Diagnosis Date  . Anxiety   . Arthritis   . Cancer ([place])   . History of total knee replacement   . Hypertension   . Hypothyroidism   . 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 Kidney stone   . OSA (obstructive sleep apnea)    pt does not wear cpap at night  . Pituitary cyst ([place])   . Pneumonia    hx of  . Prostate cancer [facility])     Past Surgical History:  Procedure Laterality Date  . ANTERIOR CERVICAL DECOMP/DISCECTOMY FUSION N/A 08/29/2012   Procedure: ANTERIOR CERVICAL DECOMPRESSION/DISCECTOMY FUSION 1  LEVEL;  Surgeon: Eustace Moore, MD;  Location: [facility]URO ORS;  Service: Neurosurgery;  Laterality: N/A;  . APPENDECTOMY    . CARDIAC CATHETERIZATION     no PCI  . CHOLECYSTECTOMY    . COLONOSCOPY W/ POLYPECTOMY    . CRANIOTOMY N/A 11/08/2012   Procedure: CRANIOTOMY HYPOPHYSECTOMY TRANSNASAL APPROACH;  Surgeon: Faythe Ghee, MD;  Location: [place] NEURO ORS;  Service: Neurosurgery;  Laterality: N/A;  Transphenoidal Resection of Pituitary Tumor  . INSERTION PROSTATE RADIATION SEED    . KIDNEY STONE SURGERY    . KNEE ARTHROSCOPY  2007   left  . NEPHRECTOMY Left   . POSTERIOR CERVICAL LAMINECTOMY Left 04/24/2015   Procedure: Foraminotomy cervical five - cervical six cervical six - cervical seven left;  Surgeon: Eustace Moore, MD;  Location: [facility] NEUR[facility] ORS;  Service: Neurosurgery;  Laterality: Left;  . REPLACEMENT TOTAL KNEE BILATERAL       Current Outpatient Prescriptions  Medication Sig Dispense Refill  . albuterol (PROVENTIL HFA;VENTOLIN HFA) 108 (90 Base) MCG/ACT inhaler Inhale 2 puffs into the lungs every 6 (six) hours as needed for wheezing or shortness of breath. 1 Inhaler 0  .  clotrimazole-betamethasone (LOTRISONE) cream Apply 1 application topically 2 (two) times daily. Use for 10 days maximum. 45 g 1  . escitalopram (LEXAPRO) 20 MG tablet Take 1 tablet (20 mg total) by mouth daily. TAKE 20 mg every day 90 tablet 3  . guaiFENesin-codeine 100-10 MG/5ML syrup Take 5 mLs by mouth every 6 (six) hours as needed for cough. 120 mL 0  . levofloxacin (LEVAQUIN) 500 MG tablet Take 1 tablet (500 mg total) by mouth daily. 7 tablet 0  . levothyroxine (SYNTHROID, LEVOTHROID) 150 MCG tablet Take 1 tablet (150 mcg total) by mouth daily. 90 tablet 3  . losartan (COZAAR) 50 MG tablet Take 1 tablet (50 mg total) by mouth daily. 90 tablet 3  . Multiple Vitamins-Minerals (MULTIVITAMINS THER. W/MINERALS) TABS Take 1 tablet by mouth daily.    . nortriptyline (PAMELOR) 75 MG capsule 75 mg at bedtime.    .  predniSONE (DELTASONE) 20 MG tablet Take 2 pills for 3 days, 1 pill for 4 days 10 tablet 0  . propranolol (INDERAL) 10 MG tablet TAKE 1 TABLET (10 MG TOTAL) BY MOUTH 2 (TWO) TIMES DAILY. 60 tablet 3  . Rivaroxaban 15 & 20 MG TBPK Take as directed on package: Start with one 15mg  tablet by mouth twice a day with food. On Day 22, switch to one 20mg  tablet once a day with food. 51 each 0  . traZODone (DESYREL) 50 MG tablet TAKE 0.5-1 TABLETS (25-50 MG TOTAL) BY MOUTH AT BEDTIME AS NEEDED FORSLEEP. 90 tablet 3  . triamcinolone cream (KENALOG) 0.1 % APPLY 1 APPLICATION TOPICALLY 2 (TWO) TIMES DAILY. FOR 1 WEEK MAX BEFORE AT LEAST A WEEK OFF- MAY RETRIAL AFTER THAT 80 g 0   No current facility-administered medications for this visit.     Allergies:   Patient has no known allergies.    Social History:  The patient  reports that he has been smoking Cigars.  He has smoked for the past 4.00 years. He has never used smokeless tobacco. He reports that he drinks about 1.2 oz of alcohol per week . He reports that he does not use drugs.   Family History:  The patient's family history includes Cancer in his father and mother; Colon cancer in his brother; Kidney cancer in his sister.    ROS:  Please see the history of present illness.   Otherwise, review of systems are positive for dizziness.   All other systems are reviewed and negative.    PHYSICAL EXAM: VS:  BP 118/76   Pulse 72   Wt 289 lb 4 oz (131.2 kg)   SpO2 97%   BMI 43.34 kg/m  , BMI Body mass index is 43.34 kg/m. GEN: Well nourished, well developed, in no acute distress  HEENT: normal  Neck: no JVD, carotid bruits, or masses Cardiac: RRR; no murmurs, rubs, or gallops,no edema  Respiratory:  wheezing bilaterally, ; intermittent pursed lip breathing GI: soft, nontender, nondistended, + BS MS: no deformity or atrophy  Skin: warm and dry, no rash Neuro:  Strength and sensation are intact Psych: euthymic mood, full affect   EKG:   The  ekg ordered In 4/18 demonstrates normal sinus rhythm, no ST segment changes; wavy baseline   Recent Labs: 11/07/2015: TSH 0.32 10/19/2016: ALT 63; BUN 23; Creatinine, Ser 1.19; Hemoglobin 16.4; Platelets 301.0; Potassium 4.4; Pro B Natriuretic peptide (BNP) 30.0; Sodium 134   Lipid Panel    Component Value Date/Time   CHOL 169 11/07/2015 0833   TRIG 189.0 (  H) 11/07/2015 0833   HDL 33.30 (L) 11/07/2015 0833   CHOLHDL 5 11/07/2015 0833   VLDL 37.8 11/07/2015 0833   LDLCALC 98 11/07/2015 0833   LDLDIRECT 62.0 09/07/2006 0920     Other studies Reviewed: Additional studies/ records that were reviewed today with results demonstrating: 2008 cath report reviewed.  Hospital records reviewed; PE in smaller vessels by CT scan   ASSESSMENT AND PLAN:  1. PE/SHOB/Dizziness: Encouraged him to stay well hydrated. May be related to PE. COntinue anticoagulation. Check echo to evaluate LV/ RV  function.  No need for blood work now as he had some in the ER.   2. BP stable.  Continue current meds.   3. Chest discomfort: No recent chest discomfort.  Normal BNP.  No fluid overload at this time. 4. Continue propranolol for tremor.  It will help with BP as well.  5. Chronic wheezing.  FOllowing with pulmonary    Current medicines are reviewed at length with the patient today.  The patient concerns regarding his medicines were addressed.  The following changes have been made:  No change  Labs/ tests ordered today include:  No orders of the defined types were placed in this encounter.   Recommend 150 minutes/week of aerobic exercise Low fat, low carb, high fiber diet recommended  Disposition:   FU based on echo   Signed, Larae Grooms, MD  10/26/2016 3:19 PM    [facility] [place], [place], [place]  [address] Phone: [phone]; Fax: [phone]

## 2016-10-26 NOTE — Patient Instructions (Signed)
Medication Instructions:  Your physician recommends that you continue on your current medications as directed. Please refer to the Current Medication list given to you today.   Labwork: NONE ORDERD  Testing/Procedures: Your physician has requested that you have an echocardiogram. Echocardiography is a painless test that uses sound waves to create images of your heart. It provides your doctor with information about the size and shape of your heart and how well your heart's chambers and valves are working. This procedure takes approximately one hour. There are no restrictions for this procedure.   Follow-Up: AS NEEDED AT THIS TIME PENDING TEST RESULTS  Any Other Special Instructions Will Be Listed Below (If Applicable).     If you need a refill on your cardiac medications before your next appointment, please call your pharmacy.

## 2016-10-27 DIAGNOSIS — M1812 Unilateral primary osteoarthritis of first carpometacarpal joint, left hand: Secondary | ICD-10-CM | POA: Diagnosis not present

## 2016-10-28 DIAGNOSIS — R062 Wheezing: Secondary | ICD-10-CM | POA: Insufficient documentation

## 2016-10-29 ENCOUNTER — Telehealth: Payer: Self-pay | Admitting: Family Medicine

## 2016-10-29 NOTE — Telephone Encounter (Signed)
Called and spoke with patient. States he still has a stuffy head, stopped up ears, and shortness of breath still with exertion. He wanted to know if you would call him something in? Please advise

## 2016-10-29 NOTE — Telephone Encounter (Signed)
We have discussed this is bronchitis. Likely to last 3-6 weeks. I have already put him on the most aggressive antibiotic I can (levaquin) so this is likely due to a virus not bacteria. I am happy to see him to review this with him and reexamine him though so please offer this as well

## 2016-10-29 NOTE — Telephone Encounter (Signed)
Pt was last seen on 4/27 and now has stopped up head and would like jamie to call him. [employer]

## 2016-10-30 NOTE — Telephone Encounter (Signed)
Spoke with patient who states he will give it some more time. He states that his ears are stopped up. He will call if symptoms worsen or new ones develop.

## 2016-11-04 ENCOUNTER — Ambulatory Visit (HOSPITAL_COMMUNITY)
Admission: RE | Admit: 2016-11-04 | Discharge: 2016-11-04 | Disposition: A | Discharge status: Home / Self Care | Payer: Medicare Other | Source: Ambulatory Visit | Attending: Cardiology | Admitting: Cardiology

## 2016-11-04 DIAGNOSIS — I2782 Chronic pulmonary embolism: Secondary | ICD-10-CM | POA: Diagnosis not present

## 2016-11-04 DIAGNOSIS — I2699 Other pulmonary embolism without acute cor pulmonale: Secondary | ICD-10-CM

## 2016-11-05 ENCOUNTER — Other Ambulatory Visit: Payer: Self-pay

## 2016-11-05 ENCOUNTER — Ambulatory Visit (HOSPITAL_COMMUNITY): Payer: Medicare Other | Attending: Cardiology

## 2016-11-05 DIAGNOSIS — I2699 Other pulmonary embolism without acute cor pulmonale: Secondary | ICD-10-CM | POA: Diagnosis not present

## 2016-11-05 MED ORDER — PERFLUTREN LIPID MICROSPHERE
1.0000 mL | INTRAVENOUS | Status: AC | PRN
  Administered 2016-11-05: 3 mL via INTRAVENOUS

## 2016-11-06 ENCOUNTER — Telehealth: Payer: Self-pay | Admitting: Interventional Cardiology

## 2016-11-06 NOTE — Telephone Encounter (Signed)
-----   Message from Jettie Booze, MD sent at 11/06/2016  8:22 AM EDT ----- Normal LV/RV function. Mild aortic stenosis which would not explain his sx.  Overall, heart is functioning well.

## 2016-11-06 NOTE — Telephone Encounter (Signed)
Patient made aware of results. Patient verbalizes understanding.  

## 2016-11-06 NOTE — Telephone Encounter (Signed)
Follow Up   Pt is calling back regarding echocardiogram results. Please call.

## 2016-11-09 DIAGNOSIS — Z4789 Encounter for other orthopedic aftercare: Secondary | ICD-10-CM | POA: Diagnosis not present

## 2016-11-09 DIAGNOSIS — M1812 Unilateral primary osteoarthritis of first carpometacarpal joint, left hand: Secondary | ICD-10-CM | POA: Diagnosis not present

## 2016-11-11 ENCOUNTER — Other Ambulatory Visit: Payer: Self-pay | Admitting: Family Medicine

## 2016-11-13 ENCOUNTER — Ambulatory Visit: Payer: Medicare Other | Admitting: Family Medicine

## 2016-11-13 ENCOUNTER — Other Ambulatory Visit: Payer: Self-pay | Admitting: Family Medicine

## 2016-11-13 MED ORDER — RIVAROXABAN 20 MG PO TABS: 20.0000 mg | ORAL_TABLET | Freq: Every day | ORAL | 3 refills | Status: DC

## 2016-11-13 NOTE — Telephone Encounter (Signed)
Should be on 20mg  daily. I sent this in for him. Please make sure he will not run out before [facility] gets the prescription to him- can give samples if so

## 2016-11-13 NOTE — Progress Notes (Signed)
xarelto

## 2016-11-16 ENCOUNTER — Other Ambulatory Visit: Payer: Self-pay

## 2016-11-16 MED ORDER — RIVAROXABAN 20 MG PO TABS: 20.0000 mg | ORAL_TABLET | Freq: Every day | ORAL | 0 refills | Status: DC

## 2016-11-17 ENCOUNTER — Telehealth: Payer: Self-pay | Admitting: Family Medicine

## 2016-11-17 NOTE — Telephone Encounter (Signed)
° ° ° °  Pt ask Wayne Booth if you call him he said he spoke with you yesterday concerning his blood thinner

## 2016-11-17 NOTE — Telephone Encounter (Signed)
Called and left a voicemail message asking for a return phone call 

## 2016-11-18 NOTE — Telephone Encounter (Signed)
Pt calling back, would like you to know he is by his phone now.

## 2016-11-19 ENCOUNTER — Other Ambulatory Visit: Payer: Self-pay | Admitting: Family Medicine

## 2016-11-19 MED ORDER — RIVAROXABAN 20 MG PO TABS: 20.0000 mg | ORAL_TABLET | Freq: Every day | ORAL | 0 refills | Status: DC

## 2016-11-27 ENCOUNTER — Encounter: Payer: Self-pay | Admitting: Family Medicine

## 2016-11-27 ENCOUNTER — Ambulatory Visit (INDEPENDENT_AMBULATORY_CARE_PROVIDER_SITE_OTHER): Payer: Medicare Other | Admitting: Family Medicine

## 2016-11-27 VITALS — BP 138/70 | HR 99 | Temp 98.1°F | Ht 68.5 in | Wt 293.8 lb

## 2016-11-27 DIAGNOSIS — Z79899 Other long term (current) drug therapy: Secondary | ICD-10-CM | POA: Diagnosis not present

## 2016-11-27 DIAGNOSIS — I10 Essential (primary) hypertension: Secondary | ICD-10-CM

## 2016-11-27 DIAGNOSIS — E890 Postprocedural hypothyroidism: Secondary | ICD-10-CM

## 2016-11-27 LAB — CBC
HCT: 47.5 % (ref 38.5–50.0)
HEMOGLOBIN: 16.4 g/dL (ref 13.2–17.1)
MCH: 31.5 pg (ref 27.0–33.0)
MCHC: 34.5 g/dL (ref 32.0–36.0)
MCV: 91.3 fL (ref 80.0–100.0)
MPV: 9.3 fL (ref 7.5–12.5)
PLATELETS: 315 10*3/uL (ref 140–400)
RBC: 5.2 MIL/uL (ref 4.20–5.80)
RDW: 14.7 % (ref 11.0–15.0)
WBC: 11.6 10*3/uL — AB (ref 3.8–10.8)

## 2016-11-27 LAB — COMPREHENSIVE METABOLIC PANEL
ALT: 24 U/L (ref 9–46)
AST: 21 U/L (ref 10–35)
Albumin: 3.7 g/dL (ref 3.6–5.1)
Alkaline Phosphatase: 83 U/L (ref 40–115)
BILIRUBIN TOTAL: 0.5 mg/dL (ref 0.2–1.2)
BUN: 14 mg/dL (ref 7–25)
CALCIUM: 9 mg/dL (ref 8.6–10.3)
CO2: 26 mmol/L (ref 20–31)
Chloride: 98 mmol/L (ref 98–110)
Creat: 1.16 mg/dL (ref 0.70–1.18)
Glucose, Bld: 120 mg/dL — ABNORMAL HIGH (ref 65–99)
POTASSIUM: 4.4 mmol/L (ref 3.5–5.3)
Sodium: 137 mmol/L (ref 135–146)
TOTAL PROTEIN: 6.7 g/dL (ref 6.1–8.1)

## 2016-11-27 NOTE — Patient Instructions (Signed)
Please stop by lab before you go  If all labs are normal- lets start a graduated exercise program whether walking or swimming 2-3 minutes at a time and building up a minute a week until you are at 30 minutes 5 days a week.

## 2016-11-27 NOTE — Progress Notes (Signed)
Subjective:  Wayne Booth is a 76 y.o. year old very pleasant male patient who presents for/with See problem oriented charting ROS- no fever, chills, nausea, vomiting. Has some shortness of breath and fatigue with ambulation but much improved from prior   Past Medical History-  Patient Active Problem List   Diagnosis Date Noted  . Pulmonary embolus ([place]) 10/23/2016    Priority: High  . 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 History of renal cell carcinoma 12/26/2007    Priority: High  . Cervical disc disease 11/13/2015    Priority: Medium  . Essential tremor 08/14/2015    Priority: Medium  . Gout 08/14/2015    Priority: Medium  . Hypothyroidism 07/12/2014    Priority: Medium  . Hyperglycemia 07/12/2014    Priority: Medium  . GAD (generalized anxiety disorder) 11/07/2013    Priority: Medium  . OSA (obstructive sleep apnea) 07/07/2011    Priority: Medium  . Morbid obesity ([place]) 10/02/2008    Priority: Medium  . Essential hypertension 12/28/2006    Priority: Medium  . Bronchospasm 06/09/2011    Priority: Low  . Actinic keratosis 11/27/2009    Priority: Low  . Osteoarthritis 11/13/2008    Priority: Low  . TESTOSTERONE DEFICIENCY 12/28/2006    Priority: Low  . Wheezing 10/28/2016    Medications- reviewed and updated Current Outpatient Prescriptions  Medication Sig Dispense Refill  . albuterol (PROVENTIL HFA;VENTOLIN HFA) 108 (90 Base) MCG/ACT inhaler Inhale 2 puffs into the lungs every 6 (six) hours as needed for wheezing or shortness of breath. 1 Inhaler 0  . clotrimazole-betamethasone (LOTRISONE) cream Apply 1 application topically 2 (two) times daily. Use for 10 days maximum. 45 g 1  . escitalopram (LEXAPRO) 20 MG tablet Take 1 tablet (20 mg total) by mouth daily. TAKE 20 mg every day 90 tablet 3  . levothyroxine (SYNTHROID, LEVOTHROID) 150 MCG tablet Take 1 tablet (150 mcg total) by mouth daily. 90 tablet 3  . losartan (COZAAR) 50 MG tablet Take 1  tablet (50 mg total) by mouth daily. 90 tablet 3  . Multiple Vitamins-Minerals (MULTIVITAMINS THER. W/MINERALS) TABS Take 1 tablet by mouth daily.    . nortriptyline (PAMELOR) 75 MG capsule 75 mg at bedtime.    . propranolol (INDERAL) 10 MG tablet TAKE 1 TABLET (10 MG TOTAL) BY MOUTH 2 (TWO) TIMES DAILY. 60 tablet 3  . rivaroxaban (XARELTO) 20 MG TABS tablet Take 1 tablet (20 mg total) by mouth daily with supper. 90 tablet 0  . traZODone (DESYREL) 50 MG tablet TAKE 0.5-1 TABLETS (25-50 MG TOTAL) BY MOUTH AT BEDTIME AS NEEDED FORSLEEP. 90 tablet 3  . triamcinolone cream (KENALOG) 0.1 % APPLY 1 APPLICATION TOPICALLY 2 (TWO) TIMES DAILY. FOR 1 WEEK MAX BEFORE AT LEAST A WEEK OFF- MAY RETRIAL AFTER THAT 80 g 0   No current facility-administered medications for this visit.     Objective: BP 138/70 (BP Location: Left Arm, Patient Position: Sitting, Cuff Size: Large)   Pulse 99   Temp 98.1 F (36.7 C) (Oral)   Ht 5' 8.5" (1.74 m)   Wt 293 lb 12.8 oz (133.3 kg)   SpO2 92%   BMI 44.02 kg/m  I walked patient and oxygen remained in 92-94% range. He did appear winded Gen: NAD, resting comfortably CV: RRR no murmurs rubs or gallops Lungs: CTAB no crackles, wheeze, rhonchi Abdomen: obese Ext: trace edema Skin: warm, dry Neuro: grossly normal, moves all extremities  Assessment/Plan:  hypothyroidism - Plan:  TSH Essential hypertension - Plan: CMP, CBC (no diff) Encounter for long-term (current) use of medications - Plan: Vitamin B12  Fatigue, shortness of breath S: patient returns for care concerned that he is having lingering fatigue and shortness of breath though overall much improved from when he had bronchitis and acute PE. Cough has completely resolved at this point But he still States gets tired walking to his car or around grocery store but overall feels much better. He had a friend state he had similar and resolved with b12 injection so he wanted to make sure his b12 was not low   He is  compliant with his xarelto. He has seen cardiology and had echocardiogram which was largely normal- no stress test was advised. He states he just doesn't have any "get up and go." No depressed mood. No anhedonia.  A/P: 76 year old male that was improving his conditioning prebronchitis and acute PE who has become deconditioned again after these 2 issues. No clear findings on labs to indicate why he is still fatigued- I more strongly suspect deconditioning. We will recheck cbc in 2 weeks due to mild leukocytosis.   We discussed a very slow graduated exercise program and if worsening symptoms to follow up to care. We could get him plugged back in with cardiology for stress testing though they have not thought that was indicated on last 2 check ups one in December and one within 2 months.   Should return to care if not progressing with graduated exercise program  Orders Placed This Encounter  Procedures  . TSH  . CMP  . CBC (no diff)  . Vitamin B12   Return precautions advised.  [name], MD

## 2016-11-28 LAB — TSH: TSH: 1.27 mIU/L (ref 0.40–4.50)

## 2016-11-28 LAB — VITAMIN B12: VITAMIN B 12: 481 pg/mL (ref 200–1100)

## 2016-11-30 ENCOUNTER — Other Ambulatory Visit: Payer: Self-pay | Admitting: Family Medicine

## 2016-11-30 DIAGNOSIS — D72829 Elevated white blood cell count, unspecified: Secondary | ICD-10-CM

## 2016-12-14 ENCOUNTER — Telehealth: Payer: Self-pay | Admitting: Family Medicine

## 2016-12-14 ENCOUNTER — Other Ambulatory Visit (INDEPENDENT_AMBULATORY_CARE_PROVIDER_SITE_OTHER): Payer: Medicare Other

## 2016-12-14 ENCOUNTER — Other Ambulatory Visit: Payer: Self-pay

## 2016-12-14 DIAGNOSIS — D72829 Elevated white blood cell count, unspecified: Secondary | ICD-10-CM | POA: Diagnosis not present

## 2016-12-14 LAB — CBC WITH DIFFERENTIAL/PLATELET
BASOS PCT: 0.7 % (ref 0.0–3.0)
Basophils Absolute: 0.1 10*3/uL (ref 0.0–0.1)
EOS PCT: 3.6 % (ref 0.0–5.0)
Eosinophils Absolute: 0.3 10*3/uL (ref 0.0–0.7)
HEMATOCRIT: 50 % (ref 39.0–52.0)
HEMOGLOBIN: 17 g/dL (ref 13.0–17.0)
Lymphocytes Relative: 25.4 % (ref 12.0–46.0)
Lymphs Abs: 2.4 10*3/uL (ref 0.7–4.0)
MCHC: 34 g/dL (ref 30.0–36.0)
MCV: 92.5 fl (ref 78.0–100.0)
MONOS PCT: 9.1 % (ref 3.0–12.0)
Monocytes Absolute: 0.8 10*3/uL (ref 0.1–1.0)
Neutro Abs: 5.7 10*3/uL (ref 1.4–7.7)
Neutrophils Relative %: 61.2 % (ref 43.0–77.0)
Platelets: 270 10*3/uL (ref 150.0–400.0)
RBC: 5.4 Mil/uL (ref 4.22–5.81)
RDW: 14.9 % (ref 11.5–15.5)
WBC: 9.3 10*3/uL (ref 4.0–10.5)

## 2016-12-14 MED ORDER — NORTRIPTYLINE HCL 75 MG PO CAPS
75.0000 mg | ORAL_CAPSULE | Freq: Every day | ORAL | 1 refills | Status: DC
Start: 2016-12-14 — End: 2017-06-05

## 2016-12-14 NOTE — Telephone Encounter (Signed)
Prescription sent to pharmacy as requested.

## 2016-12-14 NOTE — Telephone Encounter (Signed)
The patient needs nortriptyline (PAMELOR) 75 MG capsule  Sent to:  [facility] [address], [place] [phone] (Phone) [phone] (Fax)

## 2016-12-29 ENCOUNTER — Ambulatory Visit: Payer: Medicare Other | Admitting: Family Medicine

## 2017-02-03 ENCOUNTER — Encounter: Payer: Self-pay | Admitting: Family Medicine

## 2017-02-03 ENCOUNTER — Other Ambulatory Visit: Payer: Self-pay

## 2017-02-03 ENCOUNTER — Ambulatory Visit (INDEPENDENT_AMBULATORY_CARE_PROVIDER_SITE_OTHER): Payer: Medicare Other | Admitting: Family Medicine

## 2017-02-03 VITALS — BP 132/88 | HR 76 | Temp 97.9°F | Ht 68.5 in | Wt 294.8 lb

## 2017-02-03 DIAGNOSIS — I10 Essential (primary) hypertension: Secondary | ICD-10-CM | POA: Diagnosis not present

## 2017-02-03 DIAGNOSIS — E890 Postprocedural hypothyroidism: Secondary | ICD-10-CM | POA: Diagnosis not present

## 2017-02-03 DIAGNOSIS — I2699 Other pulmonary embolism without acute cor pulmonale: Secondary | ICD-10-CM | POA: Diagnosis not present

## 2017-02-03 MED ORDER — LOSARTAN POTASSIUM 50 MG PO TABS: 50.0000 mg | ORAL_TABLET | Freq: Every day | ORAL | 3 refills | Status: DC

## 2017-02-03 NOTE — Patient Instructions (Signed)
No changes today  Will repeat CT scan and update bloodwork at next visit. Hold on colonoscopy until after off blood thinner.

## 2017-02-03 NOTE — Progress Notes (Signed)
Subjective:  Wayne Booth is a 76 y.o. year old very pleasant male patient who presents for/with See problem oriented charting ROS- no chest pain. Shortness of breath if really pushes himself but none with regular activities, denies edema but slight noted on exam. States anxiety well controlled.    Past Medical History-  Patient Active Problem List   Diagnosis Date Noted  . Pulmonary embolus ([place]) 10/23/2016    Priority: High  . 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 History of renal cell carcinoma 12/26/2007    Priority: High  . Cervical disc disease 11/13/2015    Priority: Medium  . Essential tremor 08/14/2015    Priority: Medium  . Gout 08/14/2015    Priority: Medium  . Hypothyroidism 07/12/2014    Priority: Medium  . Hyperglycemia 07/12/2014    Priority: Medium  . GAD (generalized anxiety disorder) 11/07/2013    Priority: Medium  . OSA (obstructive sleep apnea) 07/07/2011    Priority: Medium  . Morbid obesity ([place]) 10/02/2008    Priority: Medium  . Essential hypertension 12/28/2006    Priority: Medium  . Bronchospasm 06/09/2011    Priority: Low  . Actinic keratosis 11/27/2009    Priority: Low  . Osteoarthritis 11/13/2008    Priority: Low  . TESTOSTERONE DEFICIENCY 12/28/2006    Priority: Low  . Wheezing 10/28/2016    Medications- reviewed and updated Current Outpatient Prescriptions  Medication Sig Dispense Refill  . albuterol (PROVENTIL HFA;VENTOLIN HFA) 108 (90 Base) MCG/ACT inhaler Inhale 2 puffs into the lungs every 6 (six) hours as needed for wheezing or shortness of breath. 1 Inhaler 0  . clotrimazole-betamethasone (LOTRISONE) cream Apply 1 application topically 2 (two) times daily. Use for 10 days maximum. 45 g 1  . escitalopram (LEXAPRO) 20 MG tablet Take 1 tablet (20 mg total) by mouth daily. TAKE 20 mg every day 90 tablet 3  . levothyroxine (SYNTHROID, LEVOTHROID) 150 MCG tablet Take 1 tablet (150 mcg total) by mouth daily. 90 tablet  3  . losartan (COZAAR) 50 MG tablet Take 1 tablet (50 mg total) by mouth daily. 90 tablet 3  . Multiple Vitamins-Minerals (MULTIVITAMINS THER. W/MINERALS) TABS Take 1 tablet by mouth daily.    . nortriptyline (PAMELOR) 75 MG capsule Take 1 capsule (75 mg total) by mouth at bedtime. 90 capsule 1  . propranolol (INDERAL) 10 MG tablet TAKE 1 TABLET (10 MG TOTAL) BY MOUTH 2 (TWO) TIMES DAILY. 60 tablet 3  . rivaroxaban (XARELTO) 20 MG TABS tablet Take 1 tablet (20 mg total) by mouth daily with supper. 90 tablet 0  . traZODone (DESYREL) 50 MG tablet TAKE 0.5-1 TABLETS (25-50 MG TOTAL) BY MOUTH AT BEDTIME AS NEEDED FORSLEEP. 90 tablet 3  . triamcinolone cream (KENALOG) 0.1 % APPLY 1 APPLICATION TOPICALLY 2 (TWO) TIMES DAILY. FOR 1 WEEK MAX BEFORE AT LEAST A WEEK OFF- MAY RETRIAL AFTER THAT 80 g 0   No current facility-administered medications for this visit.     Objective: BP 132/88 (BP Location: Left Arm, Patient Position: Sitting, Cuff Size: Large)   Pulse 76   Temp 97.9 F (36.6 C) (Oral)   Ht 5' 8.5" (1.74 m)   Wt 294 lb 12.8 oz (133.7 kg)   SpO2 92%   BMI 44.17 kg/m  Gen: NAD, resting comfortably CV: RRR no murmurs rubs or gallops Lungs: CTAB no crackles, wheeze, rhonchi Abdomen: soft/nontender/nondistended/normal bowel sounds. Morbid obesity Ext: trace edema Skin: warm, dry Neuro: grossly normal, moves all extremities  Assessment/Plan:   Patient was improving conditioning then had set back after bronchitis and PE- had complained about fatigue for several visits. Labs and workup unremarkable. Had advised graduated exercise program. Had seen cardiology previously who did not recommended updated stress testing 2 months prior to last visit in June. He states within a few weeks this fatigue has dissipated. He only has some mild shortness of breath if really pushes himself but has done ok with water exercises. We discussed importance of weight loss and continued graduated exercise.    Hypertension S: controlled on losartan 50mg   and propranolol 10mg  BID (mainly for tremor).  BP Readings from Last 3 Encounters:  02/03/17 132/88  11/27/16 138/70  10/26/16 118/76  A/P: We discussed blood pressure goal of <140/90. Continue current meds:discussed importance of weight loss as well- started swimming again 3 weeks ago as fatigue finally lifted  Pulmonary embolus ([place]) S:  thought to be potentially provoked PE after hard cast for 6 weeks on right hand. Diagnosed 10/20/16 and on anticoagulation since that time. No DVT or Ps were found. COmpliant with xarelto 20mg . Plan 6 months treatment.  A/P:He wants to get repeat CT chest before stopping xarelto. Will see me late October and we will update labs at that time as well  Hypothyroidism S: Central-pitiuitary adenoma s/p resection. [facility]. Levothyroxine 167mcg. Last visit we ordered TSH due to fatigue but discussed with patient should have ordered T3 and T4 as central A/P: offered repeat today- he declines. He may see [facility] between now and next visit but if not agrees to allow me to check next visit  hold on colonoscopy until off of xarelto. Will repeat CT scan and update bloodwork at next visit. Hold on colonoscopy until after off blood thinner.   Return in about 11 weeks (around 04/21/2017) for follow up- or sooner if needed.  Return precautions advised.  Wayne Reddish, MD

## 2017-02-03 NOTE — Assessment & Plan Note (Signed)
S: Central-pitiuitary adenoma s/p resection. [facility]. Levothyroxine 127mcg. Last visit we ordered TSH due to fatigue but discussed with patient should have ordered T3 and T4 as central A/P: offered repeat today- he declines. He may see [facility] between now and next visit but if not agrees to allow me to check next visit

## 2017-02-03 NOTE — Assessment & Plan Note (Signed)
S:  thought to be potentially provoked PE after hard cast for 6 weeks on right hand. Diagnosed 10/20/16 and on anticoagulation since that time. No DVT or Ps were found. COmpliant with xarelto 20mg . Plan 6 months treatment.  A/P:He wants to get repeat CT chest before stopping xarelto. Will see me late October and we will update labs at that time as well

## 2017-03-17 DIAGNOSIS — H01001 Unspecified blepharitis right upper eyelid: Secondary | ICD-10-CM | POA: Diagnosis not present

## 2017-03-17 DIAGNOSIS — H01004 Unspecified blepharitis left upper eyelid: Secondary | ICD-10-CM | POA: Diagnosis not present

## 2017-03-22 ENCOUNTER — Other Ambulatory Visit: Payer: Self-pay | Admitting: Family Medicine

## 2017-03-23 LAB — PSA

## 2017-03-29 DIAGNOSIS — M1812 Unilateral primary osteoarthritis of first carpometacarpal joint, left hand: Secondary | ICD-10-CM | POA: Diagnosis not present

## 2017-03-29 DIAGNOSIS — M1811 Unilateral primary osteoarthritis of first carpometacarpal joint, right hand: Secondary | ICD-10-CM | POA: Diagnosis not present

## 2017-03-29 DIAGNOSIS — M180 Bilateral primary osteoarthritis of first carpometacarpal joints: Secondary | ICD-10-CM | POA: Diagnosis not present

## 2017-03-30 DIAGNOSIS — N401 Enlarged prostate with lower urinary tract symptoms: Secondary | ICD-10-CM | POA: Diagnosis not present

## 2017-04-12 ENCOUNTER — Ambulatory Visit (INDEPENDENT_AMBULATORY_CARE_PROVIDER_SITE_OTHER): Payer: Medicare Other | Admitting: Family Medicine

## 2017-04-12 ENCOUNTER — Encounter: Payer: Self-pay | Admitting: Family Medicine

## 2017-04-12 VITALS — BP 124/82 | HR 65 | Temp 97.6°F | Ht 68.5 in | Wt 295.0 lb

## 2017-04-12 DIAGNOSIS — I2699 Other pulmonary embolism without acute cor pulmonale: Secondary | ICD-10-CM

## 2017-04-12 DIAGNOSIS — Z23 Encounter for immunization: Secondary | ICD-10-CM

## 2017-04-12 DIAGNOSIS — E890 Postprocedural hypothyroidism: Secondary | ICD-10-CM | POA: Diagnosis not present

## 2017-04-12 DIAGNOSIS — R739 Hyperglycemia, unspecified: Secondary | ICD-10-CM | POA: Diagnosis not present

## 2017-04-12 DIAGNOSIS — C61 Malignant neoplasm of prostate: Secondary | ICD-10-CM | POA: Diagnosis not present

## 2017-04-12 DIAGNOSIS — I10 Essential (primary) hypertension: Secondary | ICD-10-CM | POA: Diagnosis not present

## 2017-04-12 DIAGNOSIS — Z86010 Personal history of colonic polyps: Secondary | ICD-10-CM

## 2017-04-12 LAB — COMPREHENSIVE METABOLIC PANEL
ALT: 28 U/L (ref 0–53)
AST: 17 U/L (ref 0–37)
Albumin: 3.6 g/dL (ref 3.5–5.2)
Alkaline Phosphatase: 70 U/L (ref 39–117)
BUN: 15 mg/dL (ref 6–23)
CHLORIDE: 102 meq/L (ref 96–112)
CO2: 31 meq/L (ref 19–32)
Calcium: 9.2 mg/dL (ref 8.4–10.5)
Creatinine, Ser: 1.11 mg/dL (ref 0.40–1.50)
GFR: 68.68 mL/min (ref >60.00)
GLUCOSE: 112 mg/dL — AB (ref 70–99)
POTASSIUM: 4.6 meq/L (ref 3.5–5.1)
Sodium: 138 mEq/L (ref 135–145)
Total Bilirubin: 0.6 mg/dL (ref 0.2–1.2)
Total Protein: 6.5 g/dL (ref 6.0–8.3)

## 2017-04-12 LAB — CBC
HCT: 48.6 % (ref 39.0–52.0)
HEMOGLOBIN: 16 g/dL (ref 13.0–17.0)
MCHC: 32.9 g/dL (ref 30.0–36.0)
MCV: 94.4 fl (ref 78.0–100.0)
PLATELETS: 274 10*3/uL (ref 150.0–400.0)
RBC: 5.15 Mil/uL (ref 4.22–5.81)
RDW: 14.8 % (ref 11.5–15.5)
WBC: 7.8 10*3/uL (ref 4.0–10.5)

## 2017-04-12 LAB — HEMOGLOBIN A1C: Hgb A1c MFr Bld: 6.1 % (ref 4.6–6.5)

## 2017-04-12 LAB — LIPID PANEL
Cholesterol: 141 mg/dL (ref 0–200)
HDL: 38.2 mg/dL — AB (ref >39.00)
LDL CALC: 69 mg/dL (ref 0–99)
NonHDL: 102.98
Total CHOL/HDL Ratio: 4
Triglycerides: 168 mg/dL — ABNORMAL HIGH (ref 0.0–149.0)
VLDL: 33.6 mg/dL (ref 0.0–40.0)

## 2017-04-12 LAB — T3, FREE: T3, Free: 2.9 pg/mL (ref 2.3–4.2)

## 2017-04-12 LAB — T4, FREE: Free T4: 0.95 ng/dL (ref 0.60–1.60)

## 2017-04-12 NOTE — Assessment & Plan Note (Signed)
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 Sees [facility] A/P:discussed checking t3 and t4 (no TSH with central hypothyroidism)

## 2017-04-12 NOTE — Addendum Note (Signed)
Addended by: Lyndle Herrlich on: 04/12/2017 08:54 AM   Modules accepted: Orders

## 2017-04-12 NOTE — Assessment & Plan Note (Signed)
Refer back for 3 year colonoscopy as coming off xarelto now

## 2017-04-12 NOTE — Assessment & Plan Note (Signed)
S: controlled on losartan 50mg  and propranolol 10mg  BID.  BP Readings from Last 3 Encounters:  04/12/17 124/82  02/03/17 132/88  11/27/16 138/70  A/P: We discussed blood pressure goal of <140/90. Continue current meds

## 2017-04-12 NOTE — Progress Notes (Signed)
Subjective:  Wayne Booth is a 76 y.o. year old very pleasant male patient who presents for/with See problem oriented charting ROS- walking 2 miles many days a week- only mildly short of breath, working on stairs and mildly short of breath, no chest pain. No leg swelling above baseline- trace  Past Medical History-  Patient Active Problem List   Diagnosis Date Noted  . Pulmonary embolus ([place]) 10/23/2016    Priority: High  . 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 History of renal cell carcinoma 12/26/2007    Priority: High  . Cervical disc disease 11/13/2015    Priority: Medium  . Essential tremor 08/14/2015    Priority: Medium  . Gout 08/14/2015    Priority: Medium  . Hypothyroidism 07/12/2014    Priority: Medium  . Hyperglycemia 07/12/2014    Priority: Medium  . GAD (generalized anxiety disorder) 11/07/2013    Priority: Medium  . OSA (obstructive sleep apnea) 07/07/2011    Priority: Medium  . Morbid obesity ([place]) 10/02/2008    Priority: Medium  . Essential hypertension 12/28/2006    Priority: Medium  . Bronchospasm 06/09/2011    Priority: Low  . Actinic keratosis 11/27/2009    Priority: Low  . Osteoarthritis 11/13/2008    Priority: Low  . TESTOSTERONE DEFICIENCY 12/28/2006    Priority: Low  . History of adenomatous polyp of colon 04/12/2017  . Wheezing 10/28/2016    Medications- reviewed and updated Current Outpatient Prescriptions  Medication Sig Dispense Refill  . albuterol (PROVENTIL HFA;VENTOLIN HFA) 108 (90 Base) MCG/ACT inhaler Inhale 2 puffs into the lungs every 6 (six) hours as needed for wheezing or shortness of breath. 1 Inhaler 0  . clotrimazole-betamethasone (LOTRISONE) cream Apply 1 application topically 2 (two) times daily. Use for 10 days maximum. 45 g 1  . escitalopram (LEXAPRO) 20 MG tablet Take 1 tablet (20 mg total) by mouth daily. TAKE 20 mg every day 90 tablet 3  . levothyroxine (SYNTHROID, LEVOTHROID) 150 MCG tablet Take 1  tablet (150 mcg total) by mouth daily. 90 tablet 3  . losartan (COZAAR) 50 MG tablet Take 1 tablet (50 mg total) by mouth daily. 90 tablet 3  . Multiple Vitamins-Minerals (MULTIVITAMINS THER. W/MINERALS) TABS Take 1 tablet by mouth daily.    . nortriptyline (PAMELOR) 75 MG capsule Take 1 capsule (75 mg total) by mouth at bedtime. 90 capsule 1  . propranolol (INDERAL) 10 MG tablet TAKE 1 TABLET (10 MG TOTAL) BY MOUTH 2 (TWO) TIMES DAILY. 60 tablet 3  . rivaroxaban (XARELTO) 20 MG TABS tablet Take 1 tablet (20 mg total) by mouth daily with supper. 90 tablet 0  . traZODone (DESYREL) 50 MG tablet TAKE 0.5-1 TABLETS (25-50 MG TOTAL) BY MOUTH AT BEDTIME AS NEEDED FORSLEEP. 90 tablet 3   Objective: BP 124/82 (BP Location: Left Arm, Patient Position: Sitting, Cuff Size: Large)   Pulse 65   Temp 97.6 F (36.4 C) (Oral)   Ht 5' 8.5" (1.74 m)   Wt 295 lb (133.8 kg)   SpO2 96%   BMI 44.20 kg/m  Gen: NAD, resting comfortably CV: RRR no murmurs rubs or gallops Lungs: CTAB no crackles, wheeze, rhonchi Abdomen: soft/nontender/nondistended/normal bowel sounds. Ext: trace edema Skin: warm, dry  Assessment/Plan:  History of adenomatous polyp of colon Refer back for 3 year colonoscopy as coming off xarelto now  ADENOCARCINOMA, PROSTATE History seed implants for prostate cancer. Had PSA a few weeks ago and reports good result with urology. Will await  psa.   Pulmonary embolus ([facility]) S: Remains on xarelto 20mg  daily. He would prefer to repeat scan - we discussed risks of radiation and contrast and how unlikely to change plan - will defer unless recurrent symptoms. He had scans upper and lower extremity for DVT negative  Thought potentially provoked as hard cast on hand prior to DVT.   A/P: Thought provoked. stop xarelto when finishes his 6th month bottle. Watch closely for recurrence of symptoms and scan only if recurrence  Had been on aspirin 81mg - will place him on 325 mg daily  Essential  hypertension S: controlled on losartan 50mg  and propranolol 10mg  BID.  BP Readings from Last 3 Encounters:  04/12/17 124/82  02/03/17 132/88  11/27/16 138/70  A/P: We discussed blood pressure goal of <140/90. 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 Sees [facility] A/P:discussed checking t3 and t4 (no TSH with central hypothyroidism)  Hyperglycemia Lab Results  Component Value Date   HGBA1C 5.7 11/05/2014  weight pretty stable over last vfew months- is back to walking- hoping he can improve this. Update a1c.    Return in about 6 months (around 10/11/2017) for physical.  Orders Placed This Encounter  Procedures  . CBC    [place]  . Comprehensive metabolic panel    [place]    Order Specific Question:   Has the patient fasted?    Answer:   No  . Lipid panel    [place]    Order Specific Question:   Has the patient fasted?    Answer:   No  . T4, free    [place]  . T3, free  . Hemoglobin A1c    [place]  . Ambulatory referral to Gastroenterology    Referral Priority:   Routine    Referral Type:   Consultation    Referral Reason:   Specialty Services Required    Number of Visits Requested:   1    Meds ordered this encounter  Medications  . aspirin 325 MG tablet    Sig: Take 325 mg by mouth daily.    Return precautions advised.  [name], MD

## 2017-04-12 NOTE — Assessment & Plan Note (Signed)
History seed implants for prostate cancer. Had PSA a few weeks ago and reports good result with urology. Will await psa.

## 2017-04-12 NOTE — Assessment & Plan Note (Addendum)
S: Remains on xarelto 20mg  daily. He would prefer to repeat scan - we discussed risks of radiation and contrast and how unlikely to change plan - will defer unless recurrent symptoms. He had scans upper and lower extremity for DVT negative  Thought potentially provoked as hard cast on hand prior to DVT.   A/P: Thought provoked. stop xarelto when finishes his 6th month bottle. Watch closely for recurrence of symptoms and scan only if recurrence  Had been on aspirin 81mg - will place him on 325 mg daily

## 2017-04-12 NOTE — Patient Instructions (Signed)
Please stop by lab before you go  THanks for doing your flu shot today!

## 2017-04-12 NOTE — Assessment & Plan Note (Signed)
Lab Results  Component Value Date   HGBA1C 5.7 11/05/2014  weight pretty stable over last vfew months- is back to walking- hoping he can improve this. Update a1c.

## 2017-04-13 ENCOUNTER — Encounter: Payer: Self-pay | Admitting: Family Medicine

## 2017-04-23 NOTE — Progress Notes (Signed)
Pre visit review using our clinic review tool, if applicable. No additional management support is needed unless otherwise documented below in the visit note. 

## 2017-04-23 NOTE — Progress Notes (Signed)
PCP notes:   Health maintenance: Colonoscopy: scheduled 05/27/17.  Abnormal screenings: None.    Patient concerns: Tremors have not gotten better since starting medications. Will discuss this with Dr Yong Channel at next appt. Pt states that when he is in the house or at a restaurant with a lot of grease his nose runs. States that he had this in the past and it went away on it's own. States he will discuss this at his next office visit.  Pt also motivated to lose weight. Lengthy discussion regarding foods choices and alternatives and several handouts given to pt. Pt was also schedule for a nutrition consult with Inda Coke, PA.  Nurse concerns: None.    Next PCP appt: 10/13/17.

## 2017-04-23 NOTE — Progress Notes (Signed)
Subjective:   Wayne Booth is a 76 y.o. male who presents for an Initial Medicare Annual Wellness Visit.  The Patient was informed that the wellness visit is to identify future health risk and educate and initiate measures that can reduce risk for increased disease through the lifespan.   Describes health as fair.  Lives in two story condo with wife.   Review of Systems  No ROS.  Medicare Wellness Visit. Additional risk factors are reflected in the social history.  Cardiac Risk Factors include: advanced age (>45men, >22 women);obesity (BMI >30kg/m2);hypertension    Objective:    Today's Vitals   04/26/17 1133  BP: 138/82  Pulse: 65  Resp: 16  SpO2: 100%  Weight: (!) 304 lb 3.2 oz (138 kg)  Height: 5' 8.5" (1.74 m)   Body mass index is 45.58 kg/m.  Current Medications (verified) Outpatient Encounter Prescriptions as of 04/26/2017  Medication Sig  . albuterol (PROVENTIL HFA;VENTOLIN HFA) 108 (90 Base) MCG/ACT inhaler Inhale 2 puffs into the lungs every 6 (six) hours as needed for wheezing or shortness of breath.  Marland Kitchen aspirin 325 MG tablet Take 325 mg by mouth daily.  . clotrimazole-betamethasone (LOTRISONE) cream Apply 1 application topically 2 (two) times daily. Use for 10 days maximum. (Patient taking differently: Apply 1 application topically 2 (two) times daily as needed. Use for 10 days maximum.)  . escitalopram (LEXAPRO) 20 MG tablet Take 1 tablet (20 mg total) by mouth daily. TAKE 20 mg every day  . levothyroxine (SYNTHROID, LEVOTHROID) 150 MCG tablet Take 1 tablet (150 mcg total) by mouth daily.  Marland Kitchen losartan (COZAAR) 50 MG tablet Take 1 tablet (50 mg total) by mouth daily.  . Multiple Vitamins-Minerals (MULTIVITAMINS THER. W/MINERALS) TABS Take 1 tablet by mouth daily.  . nortriptyline (PAMELOR) 75 MG capsule Take 1 capsule (75 mg total) by mouth at bedtime.  . propranolol (INDERAL) 10 MG tablet TAKE 1 TABLET (10 MG TOTAL) BY MOUTH 2 (TWO) TIMES DAILY.  . traZODone  (DESYREL) 50 MG tablet TAKE 0.5-1 TABLETS (25-50 MG TOTAL) BY MOUTH AT BEDTIME AS NEEDED FORSLEEP.   No facility-administered encounter medications on file as of 04/26/2017.     Allergies (verified) Patient has no known allergies.   History: Past Medical History:  Diagnosis Date  . Allergy    States his nose runs when he is around grease.   Marland Kitchen Anxiety   . Arthritis   . Cancer ([place])   . History of total knee replacement   . Hypertension   . Hypothyroidism   . Kidney cancer, primary, with metastasis from kidney to other site [facility])   . Kidney stone   . OSA (obstructive sleep apnea)    pt does not wear cpap at night  . Pituitary cyst ([place])   . Pneumonia    hx of  . Prostate cancer [facility])    Past Surgical History:  Procedure Laterality Date  . ANTERIOR CERVICAL DECOMP/DISCECTOMY FUSION N/A 08/29/2012   Procedure: ANTERIOR CERVICAL DECOMPRESSION/DISCECTOMY FUSION 1 LEVEL;  Surgeon: Eustace Moore, MD;  Location: [facility]UR[facility]S;  Service: Neurosurgery;  Laterality: N/A;  . APPENDECTOMY    . CARDIAC CATHETERIZATION     no PCI  . CHOLECYSTECTOMY    . COLONOSCOPY W/ POLYPECTOMY    . CRANIOTOMY N/A 11/08/2012   Procedure: CRANIOTOMY HYPOPHYSECTOMY TRANSNASAL APPROACH;  Surgeon: Faythe Ghee, MD;  Location: [facility]URO ORS;  Service: Neurosurgery;  Laterality: N/A;  Transphenoidal Resection of Pituitary Tumor  . INSERTION  PROSTATE RADIATION SEED    . KIDNEY STONE SURGERY    . KNEE ARTHROSCOPY  2007   left  . NEPHRECTOMY Left   . POSTERIOR CERVICAL LAMINECTOMY Left 04/24/2015   Procedure: Foraminotomy cervical five - cervical six cervical six - cervical seven left;  Surgeon: Eustace Moore, MD;  Location: [facility]UR[facility] ORS;  Service: Neurosurgery;  Laterality: Left;  . REPLACEMENT TOTAL KNEE BILATERAL     Family History  Problem Relation Age of Onset  . Cancer Father        ?[place], died before patient born  . Cancer Mother        stomach, died when he was young  . Kidney cancer Sister   .  Colon cancer Brother   . Esophageal cancer Neg Hx   . Rectal cancer Neg Hx   . Stomach cancer Neg Hx    Social History   Occupational History  . retired Buyer, retail Retired   Social History Main Topics  . Smoking status: Current Some Day Smoker    Years: 4.00    Types: Cigars  . Smokeless tobacco: Never Used     Comment: 1 cigar per month, with golf, last one was 3-4 months.   . Alcohol use 1.2 oz/week    2 Shots of liquor per week     Comment: socially  . Drug use: No  . Sexual activity: Not on file   Tobacco Counseling Ready to quit: Not Answered Counseling given: Not Answered   Activities of Daily Living In your present state of health, do you have any difficulty performing the following activities: 04/26/2017  Hearing? N  Vision? N  Difficulty concentrating or making decisions? N  Walking or climbing stairs? N  Dressing or bathing? N  Doing errands, shopping? N  Preparing Food and eating ? N  Using the Toilet? N  In the past six months, have you accidently leaked urine? Y  Comment occasional  Do you have problems with loss of bowel control? N  Managing your Medications? N  Managing your Finances? N  Housekeeping or managing your Housekeeping? N  Some recent data might be hidden    Immunizations and Health Maintenance Immunization History  Administered Date(s) Administered  . Influenza Split 04/01/2011, 03/29/2012  . Influenza Whole 04/12/2007, 03/18/2009, 03/04/2010  . Influenza, High Dose Seasonal PF 04/03/2014, 04/04/2015, 04/24/2016, 04/12/2017  . Influenza,inj,Quad PF,6+ Mos 03/22/2013  . Pneumococcal Conjugate-13 11/12/2014  . Pneumococcal Polysaccharide-23 06/09/2011  . Td 06/29/1994  . Tdap 06/09/2011  . Zoster 09/22/2007   Health Maintenance Due  Topic Date Due  . COLONOSCOPY  10/16/2016    Patient Care Team: Marin Olp, MD as PCP - General (Family Medicine) Raynelle Bring, MD as Surgeon (Urology)  Indicate any  recent Medical Services you may have received from other than [facility] providers in the past year (date may be approximate).    Assessment:   This is a routine wellness examination for Wayne Booth. Physical assessment deferred to PCP.   Hearing/Vision screen Hearing Screening Comments: Able to hear conversational tones w/o difficulty. No issues reported.   Vision Screening Comments: Cataracts removed. Dr Ronnell Fresh[place]  Dietary issues and exercise activities discussed: Diet (meal preparation, eat out, water intake, caffeinated beverages, dairy products, fruits and vegetables): Typically eats 3 meals/day Breakfast: Eggs w/ bacon or ham or sausage with a couple pieces of toast. Half the time cooked at home.  Lunch: Take out burger, BBQ, when at home a sandwich. Dinner: Mostly  cooked at home, chicken, steak, pork chop.  Snacking on cookies, chip and dip. Eats desert if it is available.  Mostly drinks 1 stirofoam big gulp of diet coke and 2 glasses water.  Discussed decreasing amount of fat, salt and sodium, processed foods. Discussed picking better fast food restaurants. Discussed healthier ways of cooking food. Schedule pt for a nutritional consult with Inda Coke, PA for tomorrow.   Current Exercise Habits: Structured exercise class, Type of exercise: Other - see comments (swimming and walking), Time (Minutes): 50, Frequency (Times/Week): 3, Weekly Exercise (Minutes/Week): 150, Intensity: Moderate, Exercise limited by: None identified  Goals    . Weight (lb) < 250 lb (113.4 kg)      Depression Screen PHQ 2/9 Scores 04/26/2017 11/27/2016 11/13/2015 08/13/2015  PHQ - 2 Score 0 0 0 0  PHQ- 9 Score 3 - - -    Fall Risk Fall Risk  04/26/2017 11/27/2016 11/13/2015 08/13/2015 07/12/2014  Falls in the past year? No No No No No    Cognitive Function: Ad8 score reviewed for issues:  Issues making decisions:no  Less interest in hobbies / activities:no  Repeats questions, stories (family  complaining):no  Trouble using ordinary gadgets (microwave, computer, phone):no  Forgets the month or year: no  Mismanaging finances: no  Remembering appts:no  Daily problems with thinking and/or memory:no Ad8 score is=0          Screening Tests Health Maintenance  Topic Date Due  . COLONOSCOPY  10/16/2016  . TETANUS/TDAP  06/08/2021  . INFLUENZA VACCINE  Completed  . PNA vac Low Risk Adult  Completed        Plan:   Follow up with PCP as directed.  I have personally reviewed and noted the following in the patient's chart:   . Medical and social history . Use of alcohol, tobacco or illicit drugs  . Current medications and supplements . Functional ability and status . Nutritional status . Physical activity . Advanced directives . List of other physicians . Vitals . Screenings to include cognitive, depression, and falls . Referrals and appointments  In addition, I have reviewed and discussed with patient certain preventive protocols, quality metrics, and best practice recommendations. A written personalized care plan for preventive services as well as general preventive health recommendations were provided to patient.     Williemae Area, RN   04/26/2017

## 2017-04-26 ENCOUNTER — Encounter: Payer: Self-pay | Admitting: *Deleted

## 2017-04-26 ENCOUNTER — Ambulatory Visit (INDEPENDENT_AMBULATORY_CARE_PROVIDER_SITE_OTHER): Payer: Medicare Other | Admitting: *Deleted

## 2017-04-26 VITALS — BP 138/82 | HR 65 | Resp 16 | Ht 68.5 in | Wt 304.2 lb

## 2017-04-26 DIAGNOSIS — Z0000 Encounter for general adult medical examination without abnormal findings: Secondary | ICD-10-CM | POA: Diagnosis not present

## 2017-04-26 NOTE — Progress Notes (Signed)
I have reviewed and agree with note, evaluation, plan.  Thankful he is scheduled for colonoscopy.  Happy to see patient sooner than April to discuss tremors.  Thankful for discussions about weight loss Wt Readings from Last 3 Encounters:  04/26/17 (!) 304 lb 3.2 oz (138 kg)  04/12/17 295 lb (133.8 kg)  02/03/17 294 lb 12.8 oz (133.7 kg)  Appears he has lost some ground in his weight loss journey  [name], MD

## 2017-04-26 NOTE — Patient Instructions (Addendum)
Mr. Helzer ,  [place] [employer].  Bring a copy of your living will and/or healthcare power of attorney to your next office visit.  Thank you for taking time to come for your Medicare Wellness Visit. I appreciate your ongoing commitment to your health goals. Please review the following plan we discussed and let me know if I can assist you in the future.   These are the goals we discussed: Goals    . Weight (lb) < 250 lb (113.4 kg)       This is a list of the screening recommended for you and due dates:  Health Maintenance  Topic Date Due  . Colon Cancer Screening  10/16/2016  . Tetanus Vaccine  06/08/2021  . Flu Shot  Completed  . Pneumonia vaccines  Completed   Preventive Care for Adults  A healthy lifestyle and preventive care can promote health and wellness. Preventive health guidelines for adults include the following key practices.  . A routine yearly physical is a good way to check with your health care provider about your health and preventive screening. It is a chance to share any concerns and updates on your health and to receive a thorough exam.  . Visit your dentist for a routine exam and preventive care every 6 months. Brush your teeth twice a day and floss once a day. Good oral hygiene prevents tooth decay and gum disease.  . The frequency of eye exams is based on your age, health, family medical history, use  of contact lenses, and other factors. Follow your health care provider's recommendations for frequency of eye exams.  . Eat a healthy diet. Foods like vegetables, fruits, whole grains, low-fat dairy products, and lean protein foods contain the nutrients you need without too many calories. Decrease your intake of foods high in solid fats, added sugars, and salt. Eat the right amount of calories for you. Get information about a proper diet from your health care provider, if necessary.  . Regular physical exercise is one of the most important things you can do  for your health. Most adults should get at least 150 minutes of moderate-intensity exercise (any activity that increases your heart rate and causes you to sweat) each week. In addition, most adults need muscle-strengthening exercises on 2 or more days a week.  Silver Sneakers may be a benefit available to you. To determine eligibility, you may visit the website: www.silversneakers.com or contact program at (223)423-4632 Mon-Fri between 8AM-8PM.   . Maintain a healthy weight. The body mass index (BMI) is a screening tool to identify possible weight problems. It provides an estimate of body fat based on height and weight. Your health care provider can find your BMI and can help you achieve or maintain a healthy weight.   For adults 20 years and older: ? A BMI below 18.5 is considered underweight. ? A BMI of 18.5 to 24.9 is normal. ? A BMI of 25 to 29.9 is considered overweight. ? A BMI of 30 and above is considered obese.   . Maintain normal blood lipids and cholesterol levels by exercising and minimizing your intake of saturated fat. Eat a balanced diet with plenty of fruit and vegetables. Blood tests for lipids and cholesterol should begin at age 61 and be repeated every 5 years. If your lipid or cholesterol levels are high, you are over 50, or you are at high risk for heart disease, you may need your cholesterol levels checked more frequently. Ongoing high lipid  and cholesterol levels should be treated with medicines if diet and exercise are not working.  . If you smoke, find out from your health care provider how to quit. If you do not use tobacco, please do not start.  . If you choose to drink alcohol, please do not consume more than 2 drinks per day. One drink is considered to be 12 ounces (355 mL) of beer, 5 ounces (148 mL) of wine, or 1.5 ounces (44 mL) of liquor.  . If you are 12-30 years old, ask your health care provider if you should take aspirin to prevent strokes.  . Use sunscreen.  Apply sunscreen liberally and repeatedly throughout the day. You should seek shade when your shadow is shorter than you. Protect yourself by wearing long sleeves, pants, a wide-brimmed hat, and sunglasses year round, whenever you are outdoors.  . Once a month, do a whole body skin exam, using a mirror to look at the skin on your back. Tell your health care provider of new moles, moles that have irregular borders, moles that are larger than a pencil eraser, or moles that have changed in shape or color.

## 2017-04-27 ENCOUNTER — Ambulatory Visit (INDEPENDENT_AMBULATORY_CARE_PROVIDER_SITE_OTHER): Payer: Medicare Other | Admitting: Physician Assistant

## 2017-04-27 ENCOUNTER — Encounter: Payer: Self-pay | Admitting: Physician Assistant

## 2017-04-27 VITALS — BP 148/86 | HR 85 | Temp 97.7°F | Ht 68.5 in | Wt 301.0 lb

## 2017-04-27 DIAGNOSIS — I10 Essential (primary) hypertension: Secondary | ICD-10-CM | POA: Diagnosis not present

## 2017-04-27 DIAGNOSIS — R739 Hyperglycemia, unspecified: Secondary | ICD-10-CM

## 2017-04-27 DIAGNOSIS — Z713 Dietary counseling and surveillance: Secondary | ICD-10-CM | POA: Diagnosis not present

## 2017-04-27 NOTE — Progress Notes (Signed)
Wayne Booth is a 76 y.o. male here for Nutrition Consult.  I acted as a Education administrator for [employer], PA-C Anselmo Pickler, LPN  History of Present Illness:   Chief Complaint  Patient presents with  . Nutrition Counseling    HPI  Patient is here to discuss nutrition and weight loss. Patient is currently at his highest weight. He states that he recently went on a fishing trip and while he was there he drink a lot of alcohol and ate a lot of "bad" food and he is concerned with his lifestyle. He also recently got word that his hemoglobin A1c is 6.1 and he was told by his PCP, Dr. Yong Channel, that he is prediabetic. This has motivated him to come see me today. He is here with his wife, Jeral Fruit.  Dietary recall: Wakes up at 6:00 to 8:00 AM, all different times. Breakfast: bacon OR country ham, 2 eggs, rye toast, grits; Diet Coke  Lunch: skips most days OR [employer] (burger/fry/drink) 2 x a week Dinner: eats at home -- pork roast/pork chops, potatoes and beans, very occasional vegetables (broccoli or squash) Snacks: chips (regular potato chips) most nights; sweets once a week Beverages: no more than 2 drinks a night  Uses salt shaker without tasting his food  Weight: Wt Readings from Last 3 Encounters:  04/27/17 (!) 301 lb (136.5 kg)  04/26/17 (!) 304 lb 3.2 oz (138 kg)  04/12/17 295 lb (133.8 kg)    Exercise: Feels motivated to start backup, used to go swimming at [employer], he no longer does this because of time and he was ill at one point.  Support system: Wife  Sleep: OSA but doesn't use the machine  Goals: 1- lose weight 2- cut back on sodas, bread and potatoes 3- try recipes with less sodium and sugar, cut back on chips and sweets.  Estimated daily energy needs: Calories: 1800-2000 kcal Protein: 65-80 g Fluid: 1.8-2 L  Lab Results  Component Value Date   HGBA1C 6.1 04/12/2017   BP Readings from Last 3 Encounters:  04/27/17 (!) 148/86  04/26/17 138/82   04/12/17 124/82     Past Medical History:  Diagnosis Date  . Allergy    States his nose runs when he is around grease.   Marland Kitchen Anxiety   . Arthritis   . Cancer ([place])   . History of total knee replacement   . Hypertension   . Hypothyroidism   . Kidney cancer, primary, with metastasis from kidney to other site [facility])   . Kidney stone   . OSA (obstructive sleep apnea)    pt does not wear cpap at night  . Pituitary cyst ([place])   . Pneumonia    hx of  . Prostate cancer [facility])      Social History   Social History  . Marital status: Married    Spouse name: [name]  . Number of children: Y  . Years of education: N/A   Occupational History  . retired Buyer, retail Retired   Social History Main Topics  . Smoking status: Current Some Day Smoker    Years: 4.00    Types: Cigars  . Smokeless tobacco: Never Used     Comment: 1 cigar per month, with golf, last one was 3-4 months.   . Alcohol use 1.2 oz/week    2 Shots of liquor per week     Comment: socially  . Drug use: No  . Sexual activity: Not on file  Other Topics Concern  . Not on file   Social History Narrative   Married 1965 (wife patient of Dr. Megan Salon). 2 sons. 4 granddaughters.       Retired Chemical engineer.       Hobbies: golf, fishing, formerly hunting.     Past Surgical History:  Procedure Laterality Date  . ANTERIOR CERVICAL DECOMP/DISCECTOMY FUSION N/A 08/29/2012   Procedure: ANTERIOR CERVICAL DECOMPRESSION/DISCECTOMY FUSION 1 LEVEL;  Surgeon: Eustace Moore, MD;  Location: [place] [facility];  Service: Neurosurgery;  Laterality: N/A;  . APPENDECTOMY    . CARDIAC CATHETERIZATION     no PCI  . CHOLECYSTECTOMY    . COLONOSCOPY W/ POLYPECTOMY    . CRANIOTOMY N/A 11/08/2012   Procedure: CRANIOTOMY HYPOPHYSECTOMY TRANSNASAL APPROACH;  Surgeon: Faythe Ghee, MD;  Location: [facility];  Service: Neurosurgery;  Laterality: N/A;  Transphenoidal Resection of Pituitary Tumor  . INSERTION PROSTATE  RADIATION SEED    . KIDNEY STONE SURGERY    . KNEE ARTHROSCOPY  2007   left  . NEPHRECTOMY Left   . POSTERIOR CERVICAL LAMINECTOMY Left 04/24/2015   Procedure: Foraminotomy cervical five - cervical six cervical six - cervical seven left;  Surgeon: Eustace Moore, MD;  Location: [facility];  Service: Neurosurgery;  Laterality: Left;  . REPLACEMENT TOTAL KNEE BILATERAL      Family History  Problem Relation Age of Onset  . Cancer Father        ?mouth, died before patient born  . Cancer Mother        stomach, died when he was young  . Kidney cancer Sister   . Colon cancer Brother   . Esophageal cancer Neg Hx   . Rectal cancer Neg Hx   . Stomach cancer Neg Hx     No Known Allergies  Current Medications:   Current Outpatient Prescriptions:  .  albuterol (PROVENTIL HFA;VENTOLIN HFA) 108 (90 Base) MCG/ACT inhaler, Inhale 2 puffs into the lungs every 6 (six) hours as needed for wheezing or shortness of breath., Disp: 1 Inhaler, Rfl: 0 .  aspirin 325 MG tablet, Take 325 mg by mouth daily., Disp: , Rfl:  .  clotrimazole-betamethasone (LOTRISONE) cream, Apply 1 application topically 2 (two) times daily. Use for 10 days maximum. (Patient taking differently: Apply 1 application topically 2 (two) times daily as needed. Use for 10 days maximum.), Disp: 45 g, Rfl: 1 .  escitalopram (LEXAPRO) 20 MG tablet, Take 1 tablet (20 mg total) by mouth daily. TAKE 20 mg every day, Disp: 90 tablet, Rfl: 3 .  levothyroxine (SYNTHROID, LEVOTHROID) 150 MCG tablet, Take 1 tablet (150 mcg total) by mouth daily., Disp: 90 tablet, Rfl: 3 .  losartan (COZAAR) 50 MG tablet, Take 1 tablet (50 mg total) by mouth daily., Disp: 90 tablet, Rfl: 3 .  Multiple Vitamins-Minerals (MULTIVITAMINS THER. W/MINERALS) TABS, Take 1 tablet by mouth daily., Disp: , Rfl:  .  nortriptyline (PAMELOR) 75 MG capsule, Take 1 capsule (75 mg total) by mouth at bedtime., Disp: 90 capsule, Rfl: 1 .  propranolol (INDERAL) 10 MG tablet, TAKE 1  TABLET (10 MG TOTAL) BY MOUTH 2 (TWO) TIMES DAILY., Disp: 60 tablet, Rfl: 3 .  traZODone (DESYREL) 50 MG tablet, TAKE 0.5-1 TABLETS (25-50 MG TOTAL) BY MOUTH AT BEDTIME AS NEEDED FORSLEEP., Disp: 90 tablet, Rfl: 3   Review of Systems:   ROS  Negative unless otherwise specified per history of present illness   Vitals:   Vitals:   04/27/17 1003  BP: (!) 148/86  Pulse: 85  Temp: 97.7 F (36.5 C)  TempSrc: Oral  SpO2: 96%  Weight: (!) 301 lb (136.5 kg)  Height: 5' 8.5" (1.74 m)     Body mass index is 45.1 kg/m.  Physical Exam:   Physical Exam  Constitutional: He is oriented to person, place, and time. He appears well-developed and well-nourished.  HENT:  Head: Normocephalic and atraumatic.  Right Ear: External ear normal.  Left Ear: External ear normal.  Eyes: Pupils are equal, round, and reactive to light. EOM are normal.  Neck: Normal range of motion.  Pulmonary/Chest: Effort normal.  Musculoskeletal: Normal range of motion.  Neurological: He is alert and oriented to person, place, and time.  Skin: Skin is warm and dry.  Psychiatric: He has a normal mood and affect. His behavior is normal. Judgment and thought content normal.  Nursing note and vitals reviewed.   Assessment and Plan:    Giovonni was seen today for nutrition counseling.  Diagnoses and all orders for this visit:  Encounter for nutritional counseling; Morbid obesity ([place]); Hyperglycemia; Essential hypertension Reviewed nutrition guidelines for patient. We discussed a controlled, consistent carbohydrate diet with limited salt and processed meats. I recommended that he view this change as a "lifestyle change" rather than a diet to be help them stay on track and approach with a more positive view.  His wife is going to help him implement these changes. Handouts provided include: Balanced Breakfast, Balanced Lunch, Balanced Snacks, My Plate, and Eating on the Go with DM. Discussed weight loss of approximately  10% pounds needed for initial goal. Recommend slow weight loss of approximately 1 pound per week. Patient reports that he is going to need accountability and would like to see me in about 3 weeks. He has scheduled this.  Specific recommendations for patient: --Breakfast meat only once a week --Exercise at least 3 times a week, or as able --Do not go more than 4-5 hours during waking hours without eating --Limit chips, we discussed balanced snack options that would be more appropriate --Monitor all carbohydrate portions, goal is smaller than a computer mouse   . Reviewed expectations re: course of current medical issues. . Discussed self-management of symptoms. . Outlined signs and symptoms indicating need for more acute intervention. . Patient verbalized understanding and all questions were answered. . See orders for this visit as documented in the electronic medical record. . Patient received an After-Visit Summary.  CMA or LPN served as scribe during this visit. History, Physical, and Plan performed by medical provider. Documentation and orders reviewed and attested to.  I spent 25 minutes with this patient, greater than 50% was face-to-face time counseling regarding the above diagnoses.   Inda Coke, PA-C

## 2017-05-13 ENCOUNTER — Ambulatory Visit (AMBULATORY_SURGERY_CENTER): Payer: Self-pay

## 2017-05-13 VITALS — Ht 71.0 in | Wt 290.0 lb

## 2017-05-13 DIAGNOSIS — Z860100 Personal history of colon polyps, unspecified: Secondary | ICD-10-CM

## 2017-05-13 MED ORDER — SUPREP BOWEL PREP KIT 17.5-3.13-1.6 GM/177ML PO SOLN: 1.0000 | Freq: Once | ORAL | 0 refills | Status: AC

## 2017-05-13 NOTE — Progress Notes (Signed)
No allergies to eggs or soy No diet meds No home oxygen No past problems with anesthesia  Declined emmi 

## 2017-05-18 ENCOUNTER — Telehealth: Payer: Self-pay | Admitting: Physician Assistant

## 2017-05-18 ENCOUNTER — Ambulatory Visit: Payer: Medicare Other | Admitting: Physician Assistant

## 2017-05-18 NOTE — Telephone Encounter (Signed)
Patient was scheduled today for a follow up for nutrition. Marcene Brawn called and left the patient a voicemail to call back to cancel the appointment. The patient called and stated that he will want a call back from the nurse once Aldona Bar gets back for a nutrition follow up. Patient is aware that she is out on maternity leave for a few months.

## 2017-05-19 ENCOUNTER — Encounter: Payer: Self-pay | Admitting: Internal Medicine

## 2017-05-27 ENCOUNTER — Other Ambulatory Visit: Payer: Self-pay

## 2017-05-27 ENCOUNTER — Ambulatory Visit (AMBULATORY_SURGERY_CENTER): Payer: Medicare Other | Admitting: Internal Medicine

## 2017-05-27 ENCOUNTER — Encounter: Payer: Self-pay | Admitting: Internal Medicine

## 2017-05-27 VITALS — BP 113/79 | HR 75 | Temp 98.6°F | Resp 14 | Ht 71.0 in | Wt 290.0 lb

## 2017-05-27 DIAGNOSIS — Z86010 Personal history of colonic polyps: Secondary | ICD-10-CM | POA: Diagnosis present

## 2017-05-27 DIAGNOSIS — D128 Benign neoplasm of rectum: Secondary | ICD-10-CM

## 2017-05-27 DIAGNOSIS — D120 Benign neoplasm of cecum: Secondary | ICD-10-CM | POA: Diagnosis not present

## 2017-05-27 DIAGNOSIS — D129 Benign neoplasm of anus and anal canal: Secondary | ICD-10-CM

## 2017-05-27 DIAGNOSIS — D124 Benign neoplasm of descending colon: Secondary | ICD-10-CM

## 2017-05-27 DIAGNOSIS — D123 Benign neoplasm of transverse colon: Secondary | ICD-10-CM | POA: Diagnosis not present

## 2017-05-27 MED ORDER — SODIUM CHLORIDE 0.9 % IV SOLN: 500.0000 mL | INTRAVENOUS | Status: DC

## 2017-05-27 NOTE — Progress Notes (Signed)
Called to room to assist during endoscopic procedure.  Patient ID and intended procedure confirmed with present staff. Received instructions for my participation in the procedure from the performing physician.  

## 2017-05-27 NOTE — Op Note (Signed)
[name] Procedure Date: 05/27/2017 8:42 AM MRN: [id number] Endoscopist: Jerene Bears , MD Age: 76 Referring MD:  Date of Birth: 12/20/1940 Gender: Male Account #: [id number] Procedure:                Colonoscopy Indications:              Surveillance: Personal history of adenomatous                            polyps on last colonoscopy 3 years ago Medicines:                Monitored Anesthesia Care Procedure:                Pre-Anesthesia Assessment:                           - Prior to the procedure, a History and Physical                            was performed, and patient medications and                            allergies were reviewed. The patient's tolerance of                            previous anesthesia was also reviewed. The risks                            and benefits of the procedure and the sedation                            options and risks were discussed with the patient.                            All questions were answered, and informed consent                            was obtained. Prior Anticoagulants: The patient has                            taken no previous anticoagulant or antiplatelet                            agents. ASA Grade Assessment: III - A patient with                            severe systemic disease. After reviewing the risks                            and benefits, the patient was deemed in                            satisfactory condition to undergo the procedure.  After obtaining informed consent, the colonoscope                            was passed under direct vision. Throughout the                            procedure, the patient's blood pressure, pulse, and                            oxygen saturations were monitored continuously. The                            Colonoscope was introduced through the anus and                            advanced to the the cecum,  identified by                            appendiceal orifice and ileocecal valve. The                            colonoscopy was performed without difficulty. The                            patient tolerated the procedure well. The quality                            of the bowel preparation was good. The ileocecal                            valve, appendiceal orifice, and rectum were                            photographed. Scope In: 8:48:41 AM Scope Out: 9:09:56 AM Scope Withdrawal Time: 0 hours 19 minutes 32 seconds  Total Procedure Duration: 0 hours 21 minutes 15 seconds  Findings:                 The digital rectal exam was normal.                           A 4 mm polyp was found in the cecum. The polyp was                            sessile. The polyp was removed with a cold snare.                            Resection and retrieval were complete.                           A 4 mm polyp was found in the hepatic flexure. The                            polyp was sessile. The polyp was removed  with a                            cold snare. Resection and retrieval were complete.                           Two sessile polyps were found in the transverse                            colon. The polyps were 3 to 4 mm in size. These                            polyps were removed with a cold snare. Resection                            and retrieval were complete.                           Three sessile polyps were found in the descending                            colon. The polyps were 3 to 5 mm in size. These                            polyps were removed with a cold snare. Resection                            and retrieval were complete.                           A 5 mm polyp was found in the rectum. The polyp was                            sessile. The polyp was removed with a cold snare.                            Resection and retrieval were complete.                           Internal  hemorrhoids were found during                            retroflexion. The hemorrhoids were small. Complications:            No immediate complications. Estimated Blood Loss:     Estimated blood loss was minimal. Impression:               - One 4 mm polyp in the cecum, removed with a cold                            snare. Resected and retrieved.                           - One 4 mm polyp at the  hepatic flexure, removed                            with a cold snare. Resected and retrieved.                           - Two 3 to 4 mm polyps in the transverse colon,                            removed with a cold snare. Resected and retrieved.                           - Three 3 to 5 mm polyps in the descending colon,                            removed with a cold snare. Resected and retrieved.                           - One 5 mm polyp in the rectum, removed with a cold                            snare. Resected and retrieved.                           - Internal hemorrhoids. Recommendation:           - Patient has a contact number available for                            emergencies. The signs and symptoms of potential                            delayed complications were discussed with the                            patient. Return to normal activities tomorrow.                            Written discharge instructions were provided to the                            patient.                           - Resume previous diet.                           - Continue present medications.                           - Await pathology results.                           - Repeat colonoscopy is recommended. The  colonoscopy date will be determined after pathology                            results from today's exam become available for                            review. Jerene Bears, MD 05/27/2017 9:13:47 AM This report has been signed electronically.

## 2017-05-27 NOTE — Progress Notes (Signed)
Pt's states no medical or surgical changes since previsit or office visit. 

## 2017-05-27 NOTE — Progress Notes (Signed)
Report given to PACU, vss 

## 2017-05-27 NOTE — Patient Instructions (Signed)
  HANDOUTS GIVEN : POLYPS AND HEMORRHOIDS.   YOU HAD AN ENDOSCOPIC PROCEDURE TODAY AT [facility]:   Refer to the procedure report that was given to you for any specific questions about what was found during the examination.  If the procedure report does not answer your questions, please call your gastroenterologist to clarify.  If you requested that your care partner not be given the details of your procedure findings, then the procedure report has been included in a sealed envelope for you to review at your convenience later.  YOU SHOULD EXPECT: Some feelings of bloating in the abdomen. Passage of more gas than usual.  Walking can help get rid of the air that was put into your GI tract during the procedure and reduce the bloating. If you had a lower endoscopy (such as a colonoscopy or flexible sigmoidoscopy) you may notice spotting of blood in your stool or on the toilet paper. If you underwent a bowel prep for your procedure, you may not have a normal bowel movement for a few days.  Please Note:  You might notice some irritation and congestion in your nose or some drainage.  This is from the oxygen used during your procedure.  There is no need for concern and it should clear up in a day or so.  SYMPTOMS TO REPORT IMMEDIATELY:   Following lower endoscopy (colonoscopy or flexible sigmoidoscopy):  Excessive amounts of blood in the stool  Significant tenderness or worsening of abdominal pains  Swelling of the abdomen that is new, acute  Fever of 100F or higher   For urgent or emergent issues, a gastroenterologist can be reached at any hour by calling [phone].   DIET:  We do recommend a small meal at first, but then you may proceed to your regular diet.  Drink plenty of fluids but you should avoid alcoholic beverages for 24 hours.  ACTIVITY:  You should plan to take it easy for the rest of today and you should NOT DRIVE or use heavy machinery until tomorrow (because of  the sedation medicines used during the test).    FOLLOW UP: Our staff will call the number listed on your records the next business day following your procedure to check on you and address any questions or concerns that you may have regarding the information given to you following your procedure. If we do not reach you, we will leave a message.  However, if you are feeling well and you are not experiencing any problems, there is no need to return our call.  We will assume that you have returned to your regular daily activities without incident.  If any biopsies were taken you will be contacted by phone or by letter within the next 1-3 weeks.  Please call us at [phone] if you have not heard about the biopsies in 3 weeks.    SIGNATURES/CONFIDENTIALITY: You and/or your care partner have signed paperwork which will be entered into your electronic medical record.  These signatures attest to the fact that that the information above on your After Visit Summary has been reviewed and is understood.  Full responsibility of the confidentiality of this discharge information lies with you and/or your care-partner.

## 2017-05-28 ENCOUNTER — Telehealth: Payer: Self-pay

## 2017-05-28 NOTE — Telephone Encounter (Signed)
  Follow up Call-  Call back number 05/27/2017  Post procedure Call Back phone  # [phone]  Permission to leave phone message Yes  Some recent data might be hidden     Patient questions:  Do you have a fever, pain , or abdominal swelling? No. Pain Score  0 *  Have you tolerated food without any problems? Yes.    Have you been able to return to your normal activities? Yes.    Do you have any questions about your discharge instructions: Diet   No. Medications  No. Follow up visit  No.  Do you have questions or concerns about your Care? No.  Actions: * If pain score is 4 or above: No action needed, pain <4.

## 2017-06-02 ENCOUNTER — Encounter: Payer: Self-pay | Admitting: Internal Medicine

## 2017-06-05 ENCOUNTER — Other Ambulatory Visit: Payer: Self-pay | Admitting: Family Medicine

## 2017-06-08 ENCOUNTER — Ambulatory Visit: Payer: Self-pay | Admitting: *Deleted

## 2017-06-08 DIAGNOSIS — J180 Bronchopneumonia, unspecified organism: Secondary | ICD-10-CM | POA: Diagnosis not present

## 2017-06-08 DIAGNOSIS — Z86711 Personal history of pulmonary embolism: Secondary | ICD-10-CM | POA: Diagnosis not present

## 2017-06-08 DIAGNOSIS — J189 Pneumonia, unspecified organism: Secondary | ICD-10-CM | POA: Insufficient documentation

## 2017-06-08 DIAGNOSIS — Z905 Acquired absence of kidney: Secondary | ICD-10-CM | POA: Diagnosis not present

## 2017-06-08 DIAGNOSIS — R0989 Other specified symptoms and signs involving the circulatory and respiratory systems: Secondary | ICD-10-CM | POA: Diagnosis not present

## 2017-06-08 DIAGNOSIS — E039 Hypothyroidism, unspecified: Secondary | ICD-10-CM | POA: Diagnosis not present

## 2017-06-08 DIAGNOSIS — R109 Unspecified abdominal pain: Secondary | ICD-10-CM | POA: Diagnosis not present

## 2017-06-08 DIAGNOSIS — J206 Acute bronchitis due to rhinovirus: Secondary | ICD-10-CM | POA: Diagnosis not present

## 2017-06-08 DIAGNOSIS — R600 Localized edema: Secondary | ICD-10-CM | POA: Diagnosis not present

## 2017-06-08 DIAGNOSIS — Z9119 Patient's noncompliance with other medical treatment and regimen: Secondary | ICD-10-CM | POA: Diagnosis not present

## 2017-06-08 DIAGNOSIS — G250 Essential tremor: Secondary | ICD-10-CM | POA: Diagnosis not present

## 2017-06-08 DIAGNOSIS — K760 Fatty (change of) liver, not elsewhere classified: Secondary | ICD-10-CM | POA: Diagnosis not present

## 2017-06-08 DIAGNOSIS — R509 Fever, unspecified: Secondary | ICD-10-CM | POA: Diagnosis not present

## 2017-06-08 DIAGNOSIS — Z85528 Personal history of other malignant neoplasm of kidney: Secondary | ICD-10-CM | POA: Diagnosis not present

## 2017-06-08 DIAGNOSIS — G4733 Obstructive sleep apnea (adult) (pediatric): Secondary | ICD-10-CM | POA: Diagnosis not present

## 2017-06-08 DIAGNOSIS — I10 Essential (primary) hypertension: Secondary | ICD-10-CM | POA: Diagnosis not present

## 2017-06-08 DIAGNOSIS — I700 Atherosclerosis of aorta: Secondary | ICD-10-CM | POA: Diagnosis not present

## 2017-06-08 DIAGNOSIS — R0602 Shortness of breath: Secondary | ICD-10-CM | POA: Diagnosis not present

## 2017-06-08 DIAGNOSIS — J181 Lobar pneumonia, unspecified organism: Secondary | ICD-10-CM | POA: Diagnosis not present

## 2017-06-08 DIAGNOSIS — J9601 Acute respiratory failure with hypoxia: Secondary | ICD-10-CM | POA: Diagnosis not present

## 2017-06-08 DIAGNOSIS — N281 Cyst of kidney, acquired: Secondary | ICD-10-CM | POA: Diagnosis not present

## 2017-06-08 DIAGNOSIS — I517 Cardiomegaly: Secondary | ICD-10-CM | POA: Diagnosis not present

## 2017-06-08 DIAGNOSIS — J90 Pleural effusion, not elsewhere classified: Secondary | ICD-10-CM | POA: Diagnosis not present

## 2017-06-08 DIAGNOSIS — R000 Tachycardia, unspecified: Secondary | ICD-10-CM | POA: Diagnosis not present

## 2017-06-08 DIAGNOSIS — R918 Other nonspecific abnormal finding of lung field: Secondary | ICD-10-CM | POA: Diagnosis not present

## 2017-06-08 NOTE — Telephone Encounter (Signed)
Please be advised of triage note for patient below.

## 2017-06-08 NOTE — Telephone Encounter (Signed)
Pt called because he is having upper respiratory symptoms, He is wheezing, has a cough, states feels like he has a fever, and dizzy. Enc to go to the ED but wants to go to the closest urgent care to him.  He did and found out it is close so now he want to go [facility].  Wife is driving him.  Reason for Disposition . Wheezing is present  Answer Assessment - Initial Assessment Questions 1. ONSET: "When did the nasal discharge start?"      yes 2. AMOUNT: "How much discharge is there?"      Sometimes a lot, steady run 3. COUGH: "Do you have a cough?" If yes, ask: "Describe the color of your sputum" (clear, white, yellow, green)     clear 4. RESPIRATORY DISTRESS: "Describe your breathing."      sob 5. FEVER: "Do you have a fever?" If so, ask: "What is your temperature, how was it measured, and when did it start?"     Feels like he has a fever 6. SEVERITY: "Overall, how bad are you feeling right now?" (e.g., doesn't interfere with normal activities, staying home from school/work, staying in bed)      Feels bad 7. OTHER SYMPTOMS: "Do you have any other symptoms?" (e.g., sore throat, earache, wheezing, vomiting)     Sore throat, wheezing, nauseated 8. PREGNANCY: "Is there any chance you are pregnant?" "When was your last menstrual period?"     n/a  Protocols used: [place], COMMON COLD-A-AH

## 2017-06-09 DIAGNOSIS — J180 Bronchopneumonia, unspecified organism: Secondary | ICD-10-CM | POA: Insufficient documentation

## 2017-06-09 DIAGNOSIS — J206 Acute bronchitis due to rhinovirus: Secondary | ICD-10-CM | POA: Insufficient documentation

## 2017-06-09 DIAGNOSIS — I8290 Acute embolism and thrombosis of unspecified vein: Secondary | ICD-10-CM | POA: Insufficient documentation

## 2017-06-09 DIAGNOSIS — Z905 Acquired absence of kidney: Secondary | ICD-10-CM | POA: Insufficient documentation

## 2017-06-09 NOTE — Telephone Encounter (Signed)
Called patient and left a voicemail message asking for a return phone call to see how patient is feeling

## 2017-06-10 ENCOUNTER — Telehealth: Payer: Self-pay

## 2017-06-10 MED ORDER — GENERIC EXTERNAL MEDICATION
1.00 | Status: DC
Start: 2017-06-11 — End: 2017-06-10

## 2017-06-10 MED ORDER — LOSARTAN POTASSIUM 50 MG PO TABS
50.00 | ORAL_TABLET | ORAL | Status: DC
Start: 2017-06-14 — End: 2017-06-10

## 2017-06-10 MED ORDER — DOXYCYCLINE MONOHYDRATE 100 MG PO CAPS
100.00 | ORAL_CAPSULE | ORAL | Status: DC
Start: 2017-06-10 — End: 2017-06-10

## 2017-06-10 MED ORDER — SODIUM CHLORIDE 0.9 % IV SOLN
INTRAVENOUS | Status: DC
Start: ? — End: 2017-06-10

## 2017-06-10 MED ORDER — ALBUTEROL SULFATE (2.5 MG/3ML) 0.083% IN NEBU
2.50 | INHALATION_SOLUTION | RESPIRATORY_TRACT | Status: DC
Start: 2017-06-13 — End: 2017-06-10

## 2017-06-10 MED ORDER — NORTRIPTYLINE HCL 25 MG PO CAPS
75.00 | ORAL_CAPSULE | ORAL | Status: DC
Start: 2017-06-13 — End: 2017-06-10

## 2017-06-10 MED ORDER — GENERIC EXTERNAL MEDICATION
Status: DC
Start: ? — End: 2017-06-10

## 2017-06-10 MED ORDER — ACETAMINOPHEN 325 MG PO TABS
650.00 | ORAL_TABLET | ORAL | Status: DC
Start: ? — End: 2017-06-10

## 2017-06-10 MED ORDER — METHYLPREDNISOLONE SODIUM SUCC 40 MG IJ SOLR
40.00 | INTRAMUSCULAR | Status: DC
Start: 2017-06-10 — End: 2017-06-10

## 2017-06-10 MED ORDER — DM-GUAIFENESIN ER 30-600 MG PO TB12
ORAL_TABLET | ORAL | Status: DC
Start: 2017-06-13 — End: 2017-06-10

## 2017-06-10 MED ORDER — PROPRANOLOL HCL 10 MG PO TABS
10.00 | ORAL_TABLET | ORAL | Status: DC
Start: 2017-06-13 — End: 2017-06-10

## 2017-06-10 MED ORDER — ESCITALOPRAM OXALATE 10 MG PO TABS
20.00 | ORAL_TABLET | ORAL | Status: DC
Start: 2017-06-14 — End: 2017-06-10

## 2017-06-10 MED ORDER — NITROGLYCERIN 0.4 MG SL SUBL
.40 | SUBLINGUAL_TABLET | SUBLINGUAL | Status: DC
Start: ? — End: 2017-06-10

## 2017-06-10 MED ORDER — LEVOTHYROXINE SODIUM 150 MCG PO TABS
ORAL_TABLET | ORAL | Status: DC
Start: 2017-06-14 — End: 2017-06-10

## 2017-06-10 MED ORDER — ENOXAPARIN SODIUM 40 MG/0.4ML ~~LOC~~ SOLN
40.00 | SUBCUTANEOUS | Status: DC
Start: 2017-06-13 — End: 2017-06-10

## 2017-06-10 MED ORDER — BENZONATATE 100 MG PO CAPS
100.00 | ORAL_CAPSULE | ORAL | Status: DC
Start: ? — End: 2017-06-10

## 2017-06-10 MED ORDER — HYDROCODONE-ACETAMINOPHEN 5-325 MG PO TABS
ORAL_TABLET | ORAL | Status: DC
Start: ? — End: 2017-06-10

## 2017-06-10 MED ORDER — ALBUTEROL SULFATE (2.5 MG/3ML) 0.083% IN NEBU
2.50 | INHALATION_SOLUTION | RESPIRATORY_TRACT | Status: DC
Start: ? — End: 2017-06-10

## 2017-06-10 MED ORDER — HYDROCOD POLST-CPM POLST ER 10-8 MG/5ML PO SUER
5.00 | ORAL | Status: DC
Start: ? — End: 2017-06-10

## 2017-06-10 MED ORDER — ASPIRIN 325 MG PO TABS
325.00 | ORAL_TABLET | ORAL | Status: DC
Start: 2017-06-14 — End: 2017-06-10

## 2017-06-10 MED ORDER — THERA-M PO TABS
ORAL_TABLET | ORAL | Status: DC
Start: 2017-06-14 — End: 2017-06-10

## 2017-06-10 MED ORDER — ANTACID & ANTIGAS 200-200-20 MG/5ML PO SUSP
30.00 | ORAL | Status: DC
Start: ? — End: 2017-06-10

## 2017-06-10 NOTE — Telephone Encounter (Signed)
Copied from [place] [id number]. Topic: Quick Communication - Office Called Patient >> Jun 09, 2017  3:52 PM [place], [place], [place] wrote: Reason for CRM: Called patient to see how he is feeling as he was sent to Urgent Care on 06/08/17. Does he need a follow up with Dr. Yong Channel? >> Jun 09, 2017  4:07 PM Carolyn Stare wrote:   Pt is in [facility] with pneumonia,  rhino virus

## 2017-06-10 NOTE — Telephone Encounter (Signed)
Is he hospitalized? I am more than happy to follow up with him in office when he is out

## 2017-06-11 ENCOUNTER — Telehealth: Payer: Self-pay | Admitting: Family Medicine

## 2017-06-11 NOTE — Telephone Encounter (Signed)
Please call patient to advise per his request.   Copied from [place]. Topic: Appointment Scheduling - Scheduling Inquiry for Clinic >> Jun 11, 2017 11:30 AM Synthia Innocent wrote: Reason for CRM: Would like to speak with York Cerise before scheduling hospital follow up. Patient is in [facility] now.

## 2017-06-11 NOTE — Telephone Encounter (Signed)
Called and spoke with patient who is currently in the hospital in [place]. He was asking if it was ok for him to follow up through Dr. Yong Channel upon discharge. I told him that would be fine and he could schedule a follow up when he was discharged. He verbalized understanding

## 2017-06-11 NOTE — Telephone Encounter (Signed)
Patient is in hospital. Will schedule a follow up when discharged

## 2017-06-14 ENCOUNTER — Ambulatory Visit (INDEPENDENT_AMBULATORY_CARE_PROVIDER_SITE_OTHER): Payer: Medicare Other | Admitting: Family Medicine

## 2017-06-14 ENCOUNTER — Encounter: Payer: Self-pay | Admitting: Family Medicine

## 2017-06-14 VITALS — BP 132/82 | HR 78 | Temp 98.3°F | Ht 71.0 in | Wt 297.4 lb

## 2017-06-14 DIAGNOSIS — J181 Lobar pneumonia, unspecified organism: Secondary | ICD-10-CM

## 2017-06-14 DIAGNOSIS — R0602 Shortness of breath: Secondary | ICD-10-CM | POA: Diagnosis not present

## 2017-06-14 DIAGNOSIS — I10 Essential (primary) hypertension: Secondary | ICD-10-CM

## 2017-06-14 DIAGNOSIS — R062 Wheezing: Secondary | ICD-10-CM | POA: Diagnosis not present

## 2017-06-14 DIAGNOSIS — J40 Bronchitis, not specified as acute or chronic: Secondary | ICD-10-CM

## 2017-06-14 DIAGNOSIS — J189 Pneumonia, unspecified organism: Secondary | ICD-10-CM

## 2017-06-14 DIAGNOSIS — Z86711 Personal history of pulmonary embolism: Secondary | ICD-10-CM

## 2017-06-14 MED ORDER — DOCUSATE SODIUM 100 MG PO CAPS
100.00 | ORAL_CAPSULE | ORAL | Status: DC
Start: 2017-06-13 — End: 2017-06-14

## 2017-06-14 MED ORDER — GENERIC EXTERNAL MEDICATION
Status: DC
Start: ? — End: 2017-06-14

## 2017-06-14 MED ORDER — POLYETHYLENE GLYCOL 3350 17 G PO PACK
17.00 | PACK | ORAL | Status: DC
Start: ? — End: 2017-06-14

## 2017-06-14 MED ORDER — METHYLPREDNISOLONE SODIUM SUCC 40 MG IJ SOLR
40.00 | INTRAMUSCULAR | Status: DC
Start: 2017-06-13 — End: 2017-06-14

## 2017-06-14 MED ORDER — ALBUTEROL SULFATE HFA 108 (90 BASE) MCG/ACT IN AERS: 2.0000 | INHALATION_SPRAY | Freq: Four times a day (QID) | RESPIRATORY_TRACT | 2 refills | Status: DC | PRN

## 2017-06-14 MED ORDER — GENERIC EXTERNAL MEDICATION
2.00 | Status: DC
Start: 2017-06-14 — End: 2017-06-14

## 2017-06-14 NOTE — Assessment & Plan Note (Signed)
S: 76 year old male with history of now 3 cases of bronchitis in last year, PE finishing treatment earlier this year, morbid obesity, history nephrectomy who was admitted at [facility] med center in [place] for 5 days from 12/11-12/16/18 for pneumonia and acute hypoxemic respiratory failure  Patient had a case of bronchitis 02/2016 and 09/2016 but no history of smoking or asthma. His prior DOE had resolved and he had lost 10 lbs from exercising and eating well before he got sick this time and was placed on prednisone  He presented with shortness of breath, cough, generalized fatigue. CT angio showed LLL infiltrate as well as no recurrence of prior pulmonary embolis. Given fever, leukocytosis and hypoxia diagnosed as pneumonia. Had 3 days of he thought URI symptoms at home before symptoms worsened 24 hours prior to admission. No COPD history.   From care everywhere CT angio chest 06/08/17 "IMPRESSION: 1.No large or central pulmonary emboli. 2.Left lower lobe consolidation small effusion, likely infectious.  3.Fatty liver"  He did have a respiratory panel showing rhinovirus in addition to LLL consolidation. He was given rocephin in hospital and omnicef on discharge. He was able to be weaned off of oxygen before discharge. He was discharged on omnicef 300mg  BID, medrol dose pack, duonebs  His propranolol was stopped. Reviewing chart- bronchospasm was noted by Dr. Leanne Chang back in 2013.  A/P:  1. for current pneumonia- will finish omnicef (he has not picked up yet and encouraged him to do so today.  2. For current bronchitis- will have him pick up medrol dose pack and sent in albuterol 3. Agree with hospitalist to stop propranolol given potential to worsen bronchospasm- advised 1 month blood pressure recheck though mainly used for his tremor 4. Refer to pulmonary for evaluation and hopefully PFTs- I wonder if patient has underlying asthma. No smoking history to suggest COPD.  5. Thankfully on CTA  no recurrence of PE

## 2017-06-14 NOTE — Patient Instructions (Signed)
Please take the antibiotic and the steroid  Sent in inhaler for you  We will call you within a week or two about your referral to pulmonary/lung doctors. If you do not hear within 3 weeks, give [place] a call.

## 2017-06-14 NOTE — Assessment & Plan Note (Signed)
History of PE- CTA negative 05/2017. Finished 6 months of xarelto

## 2017-06-14 NOTE — Assessment & Plan Note (Signed)
Agree with hospitalist to stop propranolol given potential to worsen bronchospasm (mainly for his tremor)- advised 1 month blood pressure recheck. Continue losartan 50mg 

## 2017-06-14 NOTE — Progress Notes (Signed)
Subjective:  Wayne Booth is a 76 y.o. year old very pleasant male patient who presents for/with See problem oriented charting ROS- continues to have shortness of breath with exertion. No chest pain. Still with wheeze, cough, chest congestion.    Past Medical History-  Patient Active Problem List   Diagnosis Date Noted  . Shortness of breath 06/14/2017    Priority: High  . History of pulmonary embolism 10/23/2016    Priority: High  . 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 History of renal cell carcinoma 12/26/2007    Priority: High  . History of adenomatous polyp of colon 04/12/2017    Priority: Medium  . Cervical disc disease 11/13/2015    Priority: Medium  . Essential tremor 08/14/2015    Priority: Medium  . Gout 08/14/2015    Priority: Medium  . Hypothyroidism 07/12/2014    Priority: Medium  . Hyperglycemia 07/12/2014    Priority: Medium  . GAD (generalized anxiety disorder) 11/07/2013    Priority: Medium  . OSA (obstructive sleep apnea) 07/07/2011    Priority: Medium  . Morbid obesity ([place]) 10/02/2008    Priority: Medium  . Essential hypertension 12/28/2006    Priority: Medium  . Bronchospasm 06/09/2011    Priority: Low  . Actinic keratosis 11/27/2009    Priority: Low  . Osteoarthritis 11/13/2008    Priority: Low  . TESTOSTERONE DEFICIENCY 12/28/2006    Priority: Low    Medications- reviewed and updated Current Outpatient Medications  Medication Sig Dispense Refill  . albuterol (PROVENTIL HFA;VENTOLIN HFA) 108 (90 Base) MCG/ACT inhaler Inhale 2 puffs into the lungs every 6 (six) hours as needed for wheezing or shortness of breath. 1 Inhaler 0  . aspirin 325 MG tablet Take 325 mg by mouth daily.    . cefdinir (OMNICEF) 300 MG capsule Take 1 capsule by mouth 2 (two) times daily.    . clotrimazole-betamethasone (LOTRISONE) cream Apply 1 application topically 2 (two) times daily. Use for 10 days maximum. (Patient taking differently: Apply 1  application topically 2 (two) times daily as needed. Use for 10 days maximum.) 45 g 1  . dextromethorphan-guaiFENesin (MUCINEX DM) 30-600 MG 12hr tablet Take 1 tablet by mouth 2 (two) times daily.    Marland Kitchen escitalopram (LEXAPRO) 20 MG tablet Take 1 tablet (20 mg total) by mouth daily. TAKE 20 mg every day 90 tablet 3  . ipratropium-albuterol (DUONEB) 0.5-2.5 (3) MG/3ML SOLN Inhale 3 mLs into the lungs 4 (four) times daily.    Marland Kitchen levothyroxine (SYNTHROID, LEVOTHROID) 150 MCG tablet Take 1 tablet (150 mcg total) by mouth daily. 90 tablet 3  . losartan (COZAAR) 50 MG tablet Take 1 tablet (50 mg total) by mouth daily. 90 tablet 3  . methylPREDNISolone (MEDROL DOSEPAK) 4 MG TBPK tablet follow package directions    . Multiple Vitamins-Minerals (MULTIVITAMINS THER. W/MINERALS) TABS Take 1 tablet by mouth daily.    . nortriptyline (PAMELOR) 75 MG capsule TAKE ONE CAPSULE BY MOUTH AT BEDTIME 90 capsule 1  . traZODone (DESYREL) 50 MG tablet TAKE ONE HALF TO ONE TABLET BY MOUTH EVERY NIGHT AT BEDTIME AS NEEDED FOR SLEEP 90 tablet 2     Objective: BP 132/82 (BP Location: Left Arm, Patient Position: Sitting, Cuff Size: Large)   Pulse 78   Temp 98.3 F (36.8 C) (Oral)   Ht 5\' 11"  (1.803 m)   Wt 297 lb 6.4 oz (134.9 kg)   SpO2 92%   BMI 41.48 kg/m  Gen: NAD, resting  comfortably Mucous membranes are moist. CV: RRR no murmurs rubs or gallops Lungs: diffuse wheeze and rhonchi. Given nebulizer- rhonchi persist but wheeze less prominent- he feels more comfortable Abdomen: soft/nontender/nondistended/normal bowel sounds.  Skin: warm, dry, no rash  Assessment/Plan:  Essential hypertension  Agree with hospitalist to stop propranolol given potential to worsen bronchospasm (mainly for his tremor)- advised 1 month blood pressure recheck. Continue losartan 50mg   History of pulmonary embolism History of PE- CTA negative 05/2017. Finished 6 months of xarelto  Shortness of breath S: 76 year old male with history  of now 3 cases of bronchitis in last year, PE finishing treatment earlier this year, morbid obesity, history nephrectomy who was admitted at [facility] med center in [place] for 5 days from 12/11-12/16/18 for pneumonia and acute hypoxemic respiratory failure  Patient had a case of bronchitis 02/2016 and 09/2016 but no history of smoking or asthma. His prior DOE had resolved and he had lost 10 lbs from exercising and eating well before he got sick this time and was placed on prednisone  He presented with shortness of breath, cough, generalized fatigue. CT angio showed LLL infiltrate as well as no recurrence of prior pulmonary embolis. Given fever, leukocytosis and hypoxia diagnosed as pneumonia. Had 3 days of he thought URI symptoms at home before symptoms worsened 24 hours prior to admission. No COPD history.   From care everywhere CT angio chest 06/08/17 "IMPRESSION: 1.No large or central pulmonary emboli. 2.Left lower lobe consolidation small effusion, likely infectious.  3.Fatty liver"  He did have a respiratory panel showing rhinovirus in addition to LLL consolidation. He was given rocephin in hospital and omnicef on discharge. He was able to be weaned off of oxygen before discharge. He was discharged on omnicef 300mg  BID, medrol dose pack, duonebs  His propranolol was stopped. Reviewing chart- bronchospasm was noted by Dr. Leanne Chang back in 2013.  A/P:  1. for current pneumonia- will finish omnicef (he has not picked up yet and encouraged him to do so today.  2. For current bronchitis- will have him pick up medrol dose pack and sent in albuterol 3. Agree with hospitalist to stop propranolol given potential to worsen bronchospasm- advised 1 month blood pressure recheck though mainly used for his tremor 4. Refer to pulmonary for evaluation and hopefully PFTs- I wonder if patient has underlying asthma. No smoking history to suggest COPD.  5. Thankfully on CTA no recurrence of PE   Future  Appointments  Date Time Provider [place]  07/13/2017 10:15 AM Marin Olp, MD [facility]  10/13/2017  8:15 AM Yong Channel, Brayton Mars, MD [facility]    Orders Placed This Encounter  Procedures  . Ambulatory referral to Pulmonology    Referral Priority:   Routine    Referral Type:   Consultation    Referral Reason:   Specialty Services Required    Requested Specialty:   Pulmonary Disease    Number of Visits Requested:   1    Meds ordered this encounter  Medications  . albuterol (PROVENTIL HFA;VENTOLIN HFA) 108 (90 Base) MCG/ACT inhaler    Sig: Inhale 2 puffs into the lungs every 6 (six) hours as needed for wheezing or shortness of breath.    Dispense:  1 Inhaler    Refill:  2    Return precautions advised.  [name], MD

## 2017-06-15 MED ORDER — GENERIC EXTERNAL MEDICATION
Status: DC
Start: ? — End: 2017-06-15

## 2017-06-16 MED ORDER — ALBUTEROL SULFATE (2.5 MG/3ML) 0.083% IN NEBU: 2.5000 mg | INHALATION_SOLUTION | Freq: Once | RESPIRATORY_TRACT | Status: DC

## 2017-06-16 NOTE — Addendum Note (Signed)
Addended by: Lyndle Herrlich on: 06/16/2017 08:28 AM   Modules accepted: Orders

## 2017-06-17 ENCOUNTER — Telehealth: Payer: Self-pay | Admitting: Family Medicine

## 2017-06-17 NOTE — Telephone Encounter (Signed)
error 

## 2017-06-18 ENCOUNTER — Ambulatory Visit: Payer: Medicare Other | Admitting: Pulmonary Disease

## 2017-06-18 ENCOUNTER — Encounter: Payer: Self-pay | Admitting: Pulmonary Disease

## 2017-06-18 DIAGNOSIS — J181 Lobar pneumonia, unspecified organism: Secondary | ICD-10-CM | POA: Diagnosis not present

## 2017-06-18 DIAGNOSIS — J9801 Acute bronchospasm: Secondary | ICD-10-CM | POA: Diagnosis not present

## 2017-06-18 DIAGNOSIS — J189 Pneumonia, unspecified organism: Secondary | ICD-10-CM | POA: Insufficient documentation

## 2017-06-18 HISTORY — DX: Pneumonia, unspecified organism: J18.9

## 2017-06-18 MED ORDER — PREDNISONE 10 MG PO TABS: ORAL_TABLET | ORAL | 0 refills | Status: DC

## 2017-06-18 MED ORDER — BUDESONIDE-FORMOTEROL FUMARATE 160-4.5 MCG/ACT IN AERO: 2.0000 | INHALATION_SPRAY | Freq: Two times a day (BID) | RESPIRATORY_TRACT | 0 refills | Status: DC

## 2017-06-18 NOTE — Patient Instructions (Signed)
   Prednisone 10 mg tabs  Take 2 tabs daily with food x 5ds, then 1 tab daily with food x 5ds then STOP  Sample of Symbicort 160-2 puffs twice daily-rinse mouth after use. Call me back for prescription of this works  Use albuterol 2 puffs every 6 hours as needed for rescue if wheezing or shortness of breath

## 2017-06-18 NOTE — Assessment & Plan Note (Signed)
Left lower lobe consolidation noted on the CT not on x-ray during recent hospitalization. Rhinovirus on respiratory viral panel, no other organism isolated. Complete course of Cefdinir

## 2017-06-18 NOTE — Assessment & Plan Note (Signed)
We will treat as asthmatic bronchitis-he is still wheezing today, wishes to avoid nebulizer if possible  Prednisone 10 mg tabs  Take 2 tabs daily with food x 5ds, then 1 tab daily with food x 5ds then STOP  Sample of Symbicort 160-2 puffs twice daily-rinse mouth after use. Call me back for prescription of this works  Use albuterol 2 puffs every 6 hours as needed for rescue if wheezing or shortness of breath

## 2017-06-18 NOTE — Progress Notes (Signed)
Subjective:    Patient ID: Wayne Booth, male    DOB: Jun 03, 1941, 76 y.o.   MRN: [id number]  HPI  Chief Complaint  Patient presents with  . Pulm Consult    Was in the hospital last week for bronchitis. Has a history of DOE. Referred by Dr. Yong Channel. Slight chest tightness. Productive cough.       76 year old morbidly obese man presents for evaluation of frequent episodes of bronchitis and recent hospitalization for pneumonia. He states that he is sick every year with bronchitis and last winter he had an episode which took longer to resolve. He was sick about 10 days ago with URI that he contracted from his wife and son who were sick before that, developed shortness of breath, cough and wheezing and was hospitalized at [facility], chest x-ray was negative, CT chest angiogram 12/11 showed left lower lobe small area of consolidation, CT abdomen was negative and showed that he status post left nephrectomy. He was treated with IV antibiotics and steroids, his oxygen level was low and improved by the time of discharge.  He was discharged on cefdinir and prednisone Dosepak which she has taken and feels much better but he has still been wheezing on and off including during PCP visit when he needed albuterol nebs. His wife reports that he is increasingly short of breath.  He has a history of severe OSA but did not tolerate CPAP and does not want to try this again. He has a history of pulmonary embolism in 09/2016 right lower lobe, reviewed CT angios for which he took anticoagulation for about 3 months He has a remote history of pituitary tumor removal  He denies pedal edema, orthopnea paroxysmal nocturnal dyspnea. He reports cough productive of white sputum now with intermittent wheezing. He denies childhood history of asthma and in between these episodes he feels well. He smokes an occasional cigar last 8 months ago but denies history of cigarette smoking   Past Medical History:  Diagnosis Date   . Allergy    States his nose runs when he is around grease.   Marland Kitchen Anxiety   . Arthritis   . Cancer ([place])   . History of total knee replacement   . Hypertension   . Hypothyroidism   . Kidney cancer, primary, with metastasis from kidney to other site [facility])   . Kidney stone   . OSA (obstructive sleep apnea)    pt does not wear cpap at night  . Pituitary cyst ([place])   . Pneumonia    hx of  . Prostate cancer [facility])     Past Surgical History:  Procedure Laterality Date  . ANTERIOR CERVICAL DECOMP/DISCECTOMY FUSION N/A 08/29/2012   Procedure: ANTERIOR CERVICAL DECOMPRESSION/DISCECTOMY FUSION 1 LEVEL;  Surgeon: Eustace Moore, MD;  Location: [place] NEURO ORS;  Service: Neurosurgery;  Laterality: N/A;  . APPENDECTOMY    . CARDIAC CATHETERIZATION     no PCI  . CHOLECYSTECTOMY    . COLONOSCOPY W/ POLYPECTOMY    . CRANIOTOMY N/A 11/08/2012   Procedure: CRANIOTOMY HYPOPHYSECTOMY TRANSNASAL APPROACH;  Surgeon: Faythe Ghee, MD;  Location: [place] NEURO ORS;  Service: Neurosurgery;  Laterality: N/A;  Transphenoidal Resection of Pituitary Tumor  . INSERTION PROSTATE RADIATION SEED    . KIDNEY STONE SURGERY    . KNEE ARTHROSCOPY  2007   left  . NEPHRECTOMY Left   . POSTERIOR CERVICAL LAMINECTOMY Left 04/24/2015   Procedure: Foraminotomy cervical five - cervical six cervical six - cervical seven  left;  Surgeon: Eustace Moore, MD;  Location: [place] NEURO ORS;  Service: Neurosurgery;  Laterality: Left;  . REPLACEMENT TOTAL KNEE BILATERAL      No Known Allergies    Social History   Tobacco Use  . Smoking status: Current Some Day Smoker    Years: 4.00    Types: Cigars  . Smokeless tobacco: Never Used  . Tobacco comment: 1 cigar per month, with golf, last one was 3-4 months.   Substance Use Topics  . Alcohol use: Yes    Alcohol/week: 1.2 oz    Types: 2 Shots of liquor per week    Comment: socially  . Drug use: No    Family History  Problem Relation Age of Onset  . Cancer Father        ?mouth,  died before patient born  . Cancer Mother        stomach, died when he was young  . Kidney cancer Sister   . Colon cancer Brother 16  . Esophageal cancer Neg Hx   . Rectal cancer Neg Hx   . Stomach cancer Neg Hx      Review of Systems Positive for shortness of breath with activity, cough productive of white sputum, nasal congestion, difficulty breathing   Constitutional: negative for anorexia, fevers and sweats  Eyes: negative for irritation, redness and visual disturbance  Ears, nose, mouth, throat, and face: negative for earaches, epistaxis, nasal congestion and sore throat   Cardiovascular: negative for chest pain,, lower extremity edema, orthopnea, palpitations and syncope  Gastrointestinal: negative for abdominal pain, constipation, diarrhea, melena, nausea and vomiting  Genitourinary:negative for dysuria, frequency and hematuria  Hematologic/lymphatic: negative for bleeding, easy bruising and lymphadenopathy  Musculoskeletal:negative for arthralgias, muscle weakness and stiff joints  Neurological: negative for coordination problems, gait problems, headaches and weakness  Endocrine: negative for diabetic symptoms including polydipsia, polyuria and weight loss     Objective:   Physical Exam  Gen. Pleasant, obese, in no distress, normal affect ENT - no lesions, no post nasal drip, class 3 airway Neck: No JVD, no thyromegaly, no carotid bruits Lungs: no use of accessory muscles, no dullness to percussion, BL scattered rhonchi without rales  Cardiovascular: Rhythm regular, heart sounds  normal, no murmurs or gallops, no peripheral edema Abdomen: soft and non-tender, no hepatosplenomegaly, BS normal. Musculoskeletal: No deformities, no cyanosis or clubbing Neuro:  alert, non focal, no tremors       Assessment & Plan:

## 2017-06-23 ENCOUNTER — Telehealth: Payer: Self-pay | Admitting: Family Medicine

## 2017-06-23 NOTE — Telephone Encounter (Signed)
Please call patient to advise as soon as possible.  Copied from [place]. Topic: Quick Communication - Rx Refill/Question >> Jun 23, 2017  1:26 PM Scherrie Gerlach wrote: pt states his wheezing is better, but he still has a bad cough.  Wanted Dr Yong Channel to call him in something.  Advised pt Dr Yong Channel is out this week. Pt insist he wants Roselyn Reef to call him back.  Pt declined [place] triage. Wants to speak with Roselyn Reef.

## 2017-06-24 NOTE — Telephone Encounter (Signed)
Spoke with patient who states his wheezing has improved but he is still having the cough. I informed him the cough can last up to 8 weeks. Recommended he continue the Mucinex he has been taking and call if cough worsens or symptoms don't continue to improve. Verbalized understanding

## 2017-06-24 NOTE — Telephone Encounter (Signed)
Good advice. He can also use his albuterol- may help with cough

## 2017-06-24 NOTE — Telephone Encounter (Signed)
Called patient and let him know that he can continue to use his albuterol. He verbalized understanding

## 2017-07-06 ENCOUNTER — Other Ambulatory Visit: Payer: Self-pay | Admitting: Family Medicine

## 2017-07-12 ENCOUNTER — Ambulatory Visit: Payer: Medicare Other | Admitting: Adult Health

## 2017-07-12 ENCOUNTER — Encounter: Payer: Self-pay | Admitting: Adult Health

## 2017-07-12 VITALS — BP 126/64 | HR 93 | Ht 71.0 in | Wt 300.2 lb

## 2017-07-12 DIAGNOSIS — R0602 Shortness of breath: Secondary | ICD-10-CM

## 2017-07-12 DIAGNOSIS — J189 Pneumonia, unspecified organism: Secondary | ICD-10-CM

## 2017-07-12 DIAGNOSIS — J9801 Acute bronchospasm: Secondary | ICD-10-CM

## 2017-07-12 DIAGNOSIS — J181 Lobar pneumonia, unspecified organism: Secondary | ICD-10-CM

## 2017-07-12 MED ORDER — BUDESONIDE-FORMOTEROL FUMARATE 160-4.5 MCG/ACT IN AERO: 2.0000 | INHALATION_SPRAY | Freq: Two times a day (BID) | RESPIRATORY_TRACT | 0 refills | Status: DC

## 2017-07-12 NOTE — Patient Instructions (Addendum)
Use Albuterol Inhaler 2 puffs every 4hrs as needed. - this is your rescue inhaler  Follow up with Dr. Elsworth Soho  In 4-6 weeks with PFT and As needed   Please contact office for sooner follow up if symptoms do not improve or worsen or seek emergency care

## 2017-07-12 NOTE — Progress Notes (Signed)
@Patient  ID: Wayne Booth, male    DOB: Mar 16, 1941, 77 y.o.   MRN: [id number]  Chief Complaint  Patient presents with  . Follow-up    Referring provider: Marin Olp, MD  HPI: 77 year old male, smoker (cigars) seen for pulmonary consult December 2018 for dyspnea and pneumonia.  07/13/2017 follow-up pneumonia and bronchitis Patient presents for a one-month follow-up.  Patient was seen last visit for a pulmonary consult.  Patient had been having frequent episodes of bronchitis and recently had been admitted for pneumonia.  CT chest June 08, 2017 showed a small left lower lobe area of consolidation.  Patient was treated with IV antibiotics and steroids.  He was discharged on Ceftin ear and a prednisone Dosepak.  Patient was continued to have persistent cough and wheezing.  He was given a prednisone burst last visit and started on Symbicort.  Patient says he is starting to feel better.  His energy level is improving.  He did not feel that Symbicort helped his symptoms. He denies any hemoptysis chest pain orthopnea PND or increased leg swelling    No Known Allergies  Immunization History  Administered Date(s) Administered  . Influenza Split 04/01/2011, 03/29/2012  . Influenza Whole 04/12/2007, 03/18/2009, 03/04/2010  . Influenza, High Dose Seasonal PF 04/03/2014, 04/04/2015, 04/24/2016, 04/12/2017  . Influenza,inj,Quad PF,6+ Mos 03/22/2013  . Pneumococcal Conjugate-13 11/12/2014  . Pneumococcal Polysaccharide-23 06/09/2011  . Td 06/29/1994  . Tdap 06/09/2011  . Zoster 09/22/2007    Past Medical History:  Diagnosis Date  . Allergy    States his nose runs when he is around grease.   Marland Kitchen Anxiety   . Arthritis   . Cancer ([place])   . History of total knee replacement   . Hypertension   . Hypothyroidism   . 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 Kidney stone   . OSA (obstructive sleep apnea)    pt does not wear cpap at night  .  Pituitary cyst ([place])   . Pneumonia    hx of  . Prostate cancer ([place])     Tobacco History: Social History   Tobacco Use  Smoking Status Current Some Day Smoker  . Years: 4.00  . Types: Cigars  Smokeless Tobacco Never Used  Tobacco Comment   1 cigar per month, with golf, last one was 3-4 months.    Ready to quit: No Counseling given: Yes Comment: 1 cigar per month, with golf, last one was 3-4 months.    Outpatient Encounter Medications as of 07/12/2017  Medication Sig  . albuterol (PROVENTIL HFA;VENTOLIN HFA) 108 (90 Base) MCG/ACT inhaler Inhale 2 puffs into the lungs every 6 (six) hours as needed for wheezing or shortness of breath.  Marland Kitchen aspirin 325 MG tablet Take 325 mg by mouth daily.  . clotrimazole-betamethasone (LOTRISONE) cream Apply 1 application topically 2 (two) times daily. Use for 10 days maximum. (Patient taking differently: Apply 1 application topically 2 (two) times daily as needed. Use for 10 days maximum.)  . escitalopram (LEXAPRO) 20 MG tablet Take 1 tablet (20 mg total) by mouth daily. TAKE 20 mg every day  . levothyroxine (SYNTHROID, LEVOTHROID) 150 MCG tablet TAKE ONE TABLET BY MOUTH DAILY  . losartan (COZAAR) 50 MG tablet Take 1 tablet (50 mg total) by mouth daily.  . Multiple Vitamins-Minerals (MULTIVITAMINS THER. W/MINERALS) TABS Take 1 tablet by mouth daily.  . nortriptyline (PAMELOR) 75 MG capsule TAKE ONE CAPSULE BY MOUTH AT BEDTIME  .  traZODone (DESYREL) 50 MG tablet TAKE ONE HALF TO ONE TABLET BY MOUTH EVERY NIGHT AT BEDTIME AS NEEDED FOR SLEEP  . [DISCONTINUED] budesonide-formoterol (SYMBICORT) 160-4.5 MCG/ACT inhaler Inhale 2 puffs into the lungs 2 (two) times daily.  . [DISCONTINUED] levothyroxine (SYNTHROID, LEVOTHROID) 150 MCG tablet Take 1 tablet (150 mcg total) by mouth daily.  . [DISCONTINUED] budesonide-formoterol (SYMBICORT) 160-4.5 MCG/ACT inhaler Inhale 2 puffs into the lungs 2 (two) times daily. (Patient not taking: Reported on 07/12/2017)  .  [DISCONTINUED] ipratropium-albuterol (DUONEB) 0.5-2.5 (3) MG/3ML SOLN Inhale 3 mLs into the lungs 4 (four) times daily.  . [DISCONTINUED] methylPREDNISolone (MEDROL DOSEPAK) 4 MG TBPK tablet follow package directions  . [DISCONTINUED] predniSONE (DELTASONE) 10 MG tablet Take 2 tabs daily with food for 5 days, then take 1 tab daily for 5 days then STOP. (Patient not taking: Reported on 07/12/2017)   Facility-Administered Encounter Medications as of 07/12/2017  Medication  . albuterol (PROVENTIL) (2.5 MG/3ML) 0.083% nebulizer solution 2.5 mg     Review of Systems  Constitutional:   No  weight loss, night sweats,  Fevers, chills,  +fatigue, or  lassitude.  HEENT:   No headaches,  Difficulty swallowing,  Tooth/dental problems, or  Sore throat,                No sneezing, itching, ear ache, nasal congestion, post nasal drip,   CV:  No chest pain,  Orthopnea, PND, swelling in lower extremities, anasarca, dizziness, palpitations, syncope.   GI  No heartburn, indigestion, abdominal pain, nausea, vomiting, diarrhea, change in bowel habits, loss of appetite, bloody stools.   Resp: .  No excess mucus, no productive cough,  No non-productive cough,  No coughing up of blood.  No change in color of mucus.  No wheezing.  No chest wall deformity  Skin: no rash or lesions.  GU: no dysuria, change in color of urine, no urgency or frequency.  No flank pain, no hematuria   MS:  No joint pain or swelling.  No decreased range of motion.  No back pain.    Physical Exam  BP 126/64 (BP Location: Left Arm, Cuff Size: Large)   Pulse 93   Ht 5\' 11"  (1.803 m)   Wt (!) 300 lb 3.2 oz (136.2 kg)   SpO2 96%   BMI 41.87 kg/m   GEN: A/Ox3; pleasant , NAD, obese   HEENT:  Ware/AT,  EACs-clear, TMs-wnl, NOSE-clear, THROAT-clear, no lesions, no postnasal drip or exudate noted.   NECK:  Supple w/ fair ROM; no JVD; normal carotid impulses w/o bruits; no thyromegaly or nodules palpated; no lymphadenopathy.     RESP  Clear  P & A; w/o, wheezes/ rales/ or rhonchi. no accessory muscle use, no dullness to percussion  CARD:  RRR, no m/r/g, trace peripheral edema, pulses intact, no cyanosis or clubbing.  GI:   Soft & nt; nml bowel sounds; no organomegaly or masses detected.   Musco: Warm bil, no deformities or joint swelling noted.   Neuro: alert, no focal deficits noted.    Skin: Warm, no lesions or rashes    Lab Results:  CBC    Component Value Date/Time   WBC 7.8 04/12/2017 0833   RBC 5.15 04/12/2017 0833   HGB 16.0 04/12/2017 0833   HCT 48.6 04/12/2017 0833   PLT 274.0 04/12/2017 0833   MCV 94.4 04/12/2017 0833   MCH 31.5 11/27/2016 1555   MCHC 32.9 04/12/2017 0833   RDW 14.8 04/12/2017 0833   LYMPHSABS 2.4 12/14/2016  0743   MONOABS 0.8 12/14/2016 0743   EOSABS 0.3 12/14/2016 0743   BASOSABS 0.1 12/14/2016 0743    BMET    Component Value Date/Time   NA 138 04/12/2017 0833   NA 137 03/03/2016   K 4.6 04/12/2017 0833   CL 102 04/12/2017 0833   CO2 31 04/12/2017 0833   GLUCOSE 112 (H) 04/12/2017 0833   BUN 15 04/12/2017 0833   BUN 20 03/03/2016   CREATININE 1.11 04/12/2017 0833   CREATININE 1.16 11/27/2016 1555   CALCIUM 9.2 04/12/2017 0833   GFRNONAA >60 04/17/2015 0846   GFRAA >60 04/17/2015 0846    BNP No results found for: BNP  ProBNP    Component Value Date/Time   PROBNP 30.0 10/19/2016 1634    Imaging: No results found.   Assessment & Plan:   Community acquired pneumonia Clinically improved after antibiotics CT chest is where this was picked up , and not seen on CXR .  Cont to follow , hold on repeat CT chest at this time   Plan  Patient Instructions  Use Albuterol Inhaler 2 puffs every 4hrs as needed. - this is your rescue inhaler  Follow up with Dr. Elsworth Soho  In 4-6 weeks with PFT and As needed   Please contact office for sooner follow up if symptoms do not improve or worsen or seek emergency care        Bronchospasm Recent asthmatic  bronchitis with associated pneumonia clinically improved after steroid taper.  Patient is continue to use albuterol as needed.  PFTs on return.  Plan  Patient Instructions  Use Albuterol Inhaler 2 puffs every 4hrs as needed. - this is your rescue inhaler  Follow up with Dr. Elsworth Soho  In 4-6 weeks with PFT and As needed   Please contact office for sooner follow up if symptoms do not improve or worsen or seek emergency care           Rexene Edison, NP 07/13/2017

## 2017-07-13 ENCOUNTER — Encounter: Payer: Self-pay | Admitting: Family Medicine

## 2017-07-13 ENCOUNTER — Ambulatory Visit (INDEPENDENT_AMBULATORY_CARE_PROVIDER_SITE_OTHER): Payer: Medicare Other | Admitting: Family Medicine

## 2017-07-13 DIAGNOSIS — M79672 Pain in left foot: Secondary | ICD-10-CM

## 2017-07-13 DIAGNOSIS — R0602 Shortness of breath: Secondary | ICD-10-CM | POA: Diagnosis not present

## 2017-07-13 DIAGNOSIS — I10 Essential (primary) hypertension: Secondary | ICD-10-CM

## 2017-07-13 DIAGNOSIS — M79671 Pain in right foot: Secondary | ICD-10-CM

## 2017-07-13 NOTE — Assessment & Plan Note (Signed)
S: bilateral foot pain if standing for long periods. He has had years of pain after walking on concrete for years. He was told he could get some specialized shoes. He has already tried multiple different types of shoes wide and normal and continues to have pain. No burning sensation A/P: Suspect this is OA related- I am ok writing for shoes- sounds like custom orthotics though and wonder if he needs to go to [facility] or podiatry- he declines those referrals today but will speak with his insurance and get back to me- will have them send [place] needed forms

## 2017-07-13 NOTE — Assessment & Plan Note (Signed)
Clinically improved after antibiotics CT chest is where this was picked up , and not seen on CXR .  Cont to follow , hold on repeat CT chest at this time   Plan  Patient Instructions  Use Albuterol Inhaler 2 puffs every 4hrs as needed. - this is your rescue inhaler  Follow up with Dr. Elsworth Soho  In 4-6 weeks with PFT and As needed   Please contact office for sooner follow up if symptoms do not improve or worsen or seek emergency care

## 2017-07-13 NOTE — Assessment & Plan Note (Signed)
Recent asthmatic bronchitis with associated pneumonia clinically improved after steroid taper.  Patient is continue to use albuterol as needed.  PFTs on return.  Plan  Patient Instructions  Use Albuterol Inhaler 2 puffs every 4hrs as needed. - this is your rescue inhaler  Follow up with Dr. Elsworth Soho  In 4-6 weeks with PFT and As needed   Please contact office for sooner follow up if symptoms do not improve or worsen or seek emergency care

## 2017-07-13 NOTE — Patient Instructions (Signed)
Talk with your insurance- I am happy to sign off on something to recognize your long term issues with foot pain if it would help you get better footwear- have them send [place] a fax with what they need and I will fill it out. I have not had this specific circumstance before.   Trial 2-3 days off blood pressure medicine before next visit as well- if remains controlled - we may stop the medicine  Glad the breathing is better and you have good follow up with Dr. Elsworth Soho

## 2017-07-13 NOTE — Assessment & Plan Note (Signed)
S:  for shortness of breath- had finished omnicef for pnumonia. Also had some wheezing and did a medrol dose pack. Stopped propranoll for BP and tremor. Referred to pulmonary for evaluation and likely PFTs with possibility of asthma. Did have CTA without recurrence of PE thankfully.   PFTs planned 08/11/17. Breathing is much better at this point. Not having to use albuterol A/P: continue prn albuterol and pulmonary follow up

## 2017-07-13 NOTE — Assessment & Plan Note (Signed)
S: controlled on losartan 50mg  alone (actually has not even taken the last 2 days and has been controlled)- we had stopped propranolol  (BP and tremor) due to potential to worsen bronchospasm.  BP Readings from Last 3 Encounters:  07/13/17 130/82  07/12/17 126/64  06/18/17 118/78  A/P: We discussed blood pressure goal of <140/90 but has not taken medicine in 2 days. He wants to coninue  Losartan 8mfor nowg- he is going to trial off of it again before his next visit with me and if remains controlled we may stop it permanently or at least reduce to 25mg . He is restarting weight loss journey after getting off steroids.

## 2017-07-13 NOTE — Progress Notes (Signed)
Subjective:  Wayne Booth is a 77 y.o. year old very pleasant male patient who presents for/with See problem oriented charting ROS- shortness of breath improved. No chest pain. Stable edema. Does have bilateral foot pain   Past Medical History-  Patient Active Problem List   Diagnosis Date Noted  . Shortness of breath 06/14/2017    Priority: High  . History of pulmonary embolism 10/23/2016    Priority: High  . 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 History of renal cell carcinoma 12/26/2007    Priority: High  . History of adenomatous polyp of colon 04/12/2017    Priority: Medium  . Cervical disc disease 11/13/2015    Priority: Medium  . Essential tremor 08/14/2015    Priority: Medium  . Gout 08/14/2015    Priority: Medium  . Hypothyroidism 07/12/2014    Priority: Medium  . Hyperglycemia 07/12/2014    Priority: Medium  . GAD (generalized anxiety disorder) 11/07/2013    Priority: Medium  . OSA (obstructive sleep apnea) 07/07/2011    Priority: Medium  . Morbid obesity ([place]) 10/02/2008    Priority: Medium  . Essential hypertension 12/28/2006    Priority: Medium  . Bronchospasm 06/09/2011    Priority: Low  . Actinic keratosis 11/27/2009    Priority: Low  . Osteoarthritis 11/13/2008    Priority: Low  . TESTOSTERONE DEFICIENCY 12/28/2006    Priority: Low  . Bilateral foot pain 07/13/2017  . Community acquired pneumonia 06/18/2017    Medications- reviewed and updated Current Outpatient Medications  Medication Sig Dispense Refill  . albuterol (PROVENTIL HFA;VENTOLIN HFA) 108 (90 Base) MCG/ACT inhaler Inhale 2 puffs into the lungs every 6 (six) hours as needed for wheezing or shortness of breath. 1 Inhaler 2  . aspirin 325 MG tablet Take 325 mg by mouth daily.    . clotrimazole-betamethasone (LOTRISONE) cream Apply 1 application topically 2 (two) times daily. Use for 10 days maximum. (Patient taking differently: Apply 1 application topically 2 (two)  times daily as needed. Use for 10 days maximum.) 45 g 1  . escitalopram (LEXAPRO) 20 MG tablet Take 1 tablet (20 mg total) by mouth daily. TAKE 20 mg every day 90 tablet 3  . levothyroxine (SYNTHROID, LEVOTHROID) 150 MCG tablet TAKE ONE TABLET BY MOUTH DAILY 90 tablet 2  . losartan (COZAAR) 50 MG tablet Take 1 tablet (50 mg total) by mouth daily. 90 tablet 3  . Multiple Vitamins-Minerals (MULTIVITAMINS THER. W/MINERALS) TABS Take 1 tablet by mouth daily.    . nortriptyline (PAMELOR) 75 MG capsule TAKE ONE CAPSULE BY MOUTH AT BEDTIME 90 capsule 1  . traZODone (DESYREL) 50 MG tablet TAKE ONE HALF TO ONE TABLET BY MOUTH EVERY NIGHT AT BEDTIME AS NEEDED FOR SLEEP 90 tablet 2   Current Facility-Administered Medications  Medication Dose Route Frequency Provider Last Rate Last Dose  . albuterol (PROVENTIL) (2.5 MG/3ML) 0.083% nebulizer solution 2.5 mg  2.5 mg Nebulization Once Marin Olp, MD        Objective: BP 130/82 (BP Location: Left Arm, Patient Position: Sitting, Cuff Size: Normal)   Pulse 80   Temp (!) 97.4 F (36.3 C) (Oral)   Wt 299 lb 9.6 oz (135.9 kg)   SpO2 94%   BMI 41.79 kg/m  Gen: NAD, resting comfortably CV: RRR no murmurs rubs or gallops Lungs: CTAB no crackles, wheeze, rhonchi Abdomen: soft/nontender/nondistended/normal bowel sounds. Morbid obesity Ext: 1+ edema Skin: warm, dry  Assessment/Plan:  Essential hypertension S: controlled on losartan  50mg  alone (actually has not even taken the last 2 days and has been controlled)- we had stopped propranolol  (BP and tremor) due to potential to worsen bronchospasm.  BP Readings from Last 3 Encounters:  07/13/17 130/82  07/12/17 126/64  06/18/17 118/78  A/P: We discussed blood pressure goal of <140/90 but has not taken medicine in 2 days. He wants to coninue  Losartan 26mfor nowg- he is going to trial off of it again before his next visit with me and if remains controlled we may stop it permanently or at least reduce to  25mg . He is restarting weight loss journey after getting off steroids.   Shortness of breath S:  for shortness of breath- had finished omnicef for pnumonia. Also had some wheezing and did a medrol dose pack. Stopped propranoll for BP and tremor. Referred to pulmonary for evaluation and likely PFTs with possibility of asthma. Did have CTA without recurrence of PE thankfully.   PFTs planned 08/11/17. Breathing is much better at this point. Not having to use albuterol A/P: continue prn albuterol and pulmonary follow up  Bilateral foot pain S: bilateral foot pain if standing for long periods. He has had years of pain after walking on concrete for years. He was told he could get some specialized shoes. He has already tried multiple different types of shoes wide and normal and continues to have pain. No burning sensation A/P: Suspect this is OA related- I am ok writing for shoes- sounds like custom orthotics though and wonder if he needs to go to [facility] or podiatry- he declines those referrals today but will speak with his insurance and get back to me- will have them send [place] needed forms  Future Appointments  Date Time Provider [place]  08/11/2017 11:00 AM [facility] PFT ROOM [facility] None  08/11/2017 12:00 PM Rigoberto Noel, MD [facility] None  10/13/2017  8:15 AM Marin Olp, MD [facility]   Return precautions advised.  [name], MD

## 2017-07-14 ENCOUNTER — Ambulatory Visit: Payer: Medicare Other | Admitting: Pulmonary Disease

## 2017-07-14 NOTE — Telephone Encounter (Signed)
Wayne Booth, please call pt and schedule him for Nutrition follow up first week Aldona Bar is back. Thanks.

## 2017-07-15 DIAGNOSIS — Z96652 Presence of left artificial knee joint: Secondary | ICD-10-CM | POA: Diagnosis not present

## 2017-07-15 DIAGNOSIS — M25562 Pain in left knee: Secondary | ICD-10-CM | POA: Insufficient documentation

## 2017-07-15 DIAGNOSIS — M25552 Pain in left hip: Secondary | ICD-10-CM | POA: Diagnosis not present

## 2017-07-21 DIAGNOSIS — M1612 Unilateral primary osteoarthritis, left hip: Secondary | ICD-10-CM | POA: Diagnosis not present

## 2017-07-23 NOTE — Telephone Encounter (Signed)
Called patient and left VM stating that Sam would be back in February for patient to schedule nutrition f/u. Awaiting call back

## 2017-07-30 DIAGNOSIS — H52203 Unspecified astigmatism, bilateral: Secondary | ICD-10-CM | POA: Diagnosis not present

## 2017-07-30 DIAGNOSIS — Z961 Presence of intraocular lens: Secondary | ICD-10-CM | POA: Diagnosis not present

## 2017-07-30 DIAGNOSIS — H353132 Nonexudative age-related macular degeneration, bilateral, intermediate dry stage: Secondary | ICD-10-CM | POA: Diagnosis not present

## 2017-07-30 DIAGNOSIS — H40053 Ocular hypertension, bilateral: Secondary | ICD-10-CM | POA: Diagnosis not present

## 2017-08-11 ENCOUNTER — Encounter: Payer: Self-pay | Admitting: Pulmonary Disease

## 2017-08-11 ENCOUNTER — Ambulatory Visit: Payer: Medicare Other | Admitting: Pulmonary Disease

## 2017-08-11 ENCOUNTER — Ambulatory Visit (INDEPENDENT_AMBULATORY_CARE_PROVIDER_SITE_OTHER): Payer: Medicare Other | Admitting: Pulmonary Disease

## 2017-08-11 DIAGNOSIS — J181 Lobar pneumonia, unspecified organism: Secondary | ICD-10-CM | POA: Diagnosis not present

## 2017-08-11 DIAGNOSIS — G4733 Obstructive sleep apnea (adult) (pediatric): Secondary | ICD-10-CM | POA: Diagnosis not present

## 2017-08-11 DIAGNOSIS — R0602 Shortness of breath: Secondary | ICD-10-CM | POA: Diagnosis not present

## 2017-08-11 DIAGNOSIS — J4540 Moderate persistent asthma, uncomplicated: Secondary | ICD-10-CM

## 2017-08-11 DIAGNOSIS — J189 Pneumonia, unspecified organism: Secondary | ICD-10-CM

## 2017-08-11 DIAGNOSIS — I10 Essential (primary) hypertension: Secondary | ICD-10-CM | POA: Diagnosis not present

## 2017-08-11 LAB — PULMONARY FUNCTION TEST
DL/VA % PRED: 132 %
DL/VA: 6.02 ml/min/mmHg/L
DLCO unc % pred: 67 %
DLCO unc: 20.88 ml/min/mmHg
FEF 25-75 POST: 2.14 L/s
FEF 25-75 Pre: 1.65 L/sec
FEF2575-%Change-Post: 29 %
FEF2575-%Pred-Post: 101 %
FEF2575-%Pred-Pre: 77 %
FEV1-%Change-Post: 4 %
FEV1-%PRED-PRE: 59 %
FEV1-%Pred-Post: 61 %
FEV1-PRE: 1.74 L
FEV1-Post: 1.81 L
FEV1FVC-%Change-Post: 6 %
FEV1FVC-%PRED-PRE: 108 %
FEV6-%Change-Post: -1 %
FEV6-%PRED-PRE: 57 %
FEV6-%Pred-Post: 56 %
FEV6-POST: 2.15 L
FEV6-Pre: 2.19 L
FEV6FVC-%Change-Post: 0 %
FEV6FVC-%PRED-POST: 106 %
FEV6FVC-%Pred-Pre: 105 %
FVC-%Change-Post: -1 %
FVC-%PRED-POST: 53 %
FVC-%Pred-Pre: 54 %
FVC-POST: 2.16 L
FVC-PRE: 2.2 L
PRE FEV6/FVC RATIO: 100 %
Post FEV1/FVC ratio: 84 %
Post FEV6/FVC ratio: 100 %
Pre FEV1/FVC ratio: 79 %
RV % pred: 96 %
RV: 2.42 L
TLC % PRED: 69 %
TLC: 4.77 L

## 2017-08-11 MED ORDER — BUDESONIDE-FORMOTEROL FUMARATE 160-4.5 MCG/ACT IN AERO: 2.0000 | INHALATION_SPRAY | Freq: Two times a day (BID) | RESPIRATORY_TRACT | 0 refills | Status: DC

## 2017-08-11 MED ORDER — ALBUTEROL SULFATE HFA 108 (90 BASE) MCG/ACT IN AERS: 2.0000 | INHALATION_SPRAY | Freq: Four times a day (QID) | RESPIRATORY_TRACT | 5 refills | Status: DC | PRN

## 2017-08-11 NOTE — Assessment & Plan Note (Signed)
Not interested in treatment at this time. He did not tolerate CPAP. Weight loss encouraged

## 2017-08-11 NOTE — Progress Notes (Signed)
   Subjective:    Patient ID: Wayne Booth, male    DOB: January 23, 1941, 77 y.o.   MRN: [id number]  HPI 77 year old obese manfor follow-up of left lower lobe community acquired pneumonia and asthmatic bronchitis  He has a history of severe OSA but did not tolerate CPAP and does not want to try this again. He has a history of pulmonary embolism in 09/2016 right lower lobe, reviewed CT angios for which he took anticoagulation for about 3 months He has a remote history of pituitary tumor removal  He was hospitalized December 2018 for left lower lobe community acquired pneumonia and bronchospasm and took several doses of steroids and antibiotics to improve, still have residual bronchospasm.  He now feels about 80% improved but occasionally does have wheezing and shortness of breath  Cough has mostly resolved  Significant tests/ events reviewed  CT chest June 08, 2017 showed a small left lower lobe area of consolidation.   PFT 07/2017 moderate restriction, DLCO 67%  Past Medical History:  Diagnosis Date  . Allergy    States his nose runs when he is around grease.   Marland Kitchen Anxiety   . Arthritis   . Cancer ([place])   . History of total knee replacement   . Hypertension   . Hypothyroidism   . Kidney cancer, primary, with metastasis from kidney to other site [facility])   . Kidney stone   . OSA (obstructive sleep apnea)    pt does not wear cpap at night  . Pituitary cyst ([place])   . Pneumonia    hx of  . Prostate cancer [facility])      Review of Systems Pt denies any significant  nasal congestion or excess secretions, fever, chills, sweats, unintended wt loss, pleuritic or exertional cp, orthopnea pnd or leg swelling.  Pt also denies any obvious fluctuation in symptoms with weather or environmental change or other alleviating or aggravating factors.     Pt denies any increase in rescue therapy over baseline, denies waking up needing it or having early am exacerbations or coughing/wheezing/ or dyspnea        Objective:   Physical Exam   Gen. Pleasant, obese, in no distress, normal affect ENT - no lesions, no post nasal drip, class 2-3 airway Neck: No JVD, no thyromegaly, no carotid bruits Lungs: no use of accessory muscles, no dullness to percussion, decreased without rales or rhonchi  Cardiovascular: Rhythm regular, heart sounds  normal, no murmurs or gallops, 1+ peripheral edema Abdomen: soft and non-tender, no hepatosplenomegaly, BS normal. Musculoskeletal: No deformities, no cyanosis or clubbing Neuro:  alert, non focal, no tremors        Assessment & Plan:

## 2017-08-11 NOTE — Progress Notes (Signed)
PFT done today. 

## 2017-08-11 NOTE — Assessment & Plan Note (Signed)
Symbicort 160-2 puffs twice daily, rinse mouth after use-this would be your maintenance medication.  Refills on albuterol be used for rescue as needed  If he does well in 3 months , can stepdown

## 2017-08-11 NOTE — Patient Instructions (Signed)
Symbicort 160-2 puffs twice daily, rinse mouth after use-this would be your maintenance medication.  Refills on albuterol be used for rescue as needed

## 2017-08-11 NOTE — Assessment & Plan Note (Signed)
He has mild pedal edema and will get back on his thiazide diuretic

## 2017-08-11 NOTE — Assessment & Plan Note (Signed)
Resolved

## 2017-08-12 ENCOUNTER — Other Ambulatory Visit: Payer: Self-pay | Admitting: Family Medicine

## 2017-08-18 DIAGNOSIS — M1612 Unilateral primary osteoarthritis, left hip: Secondary | ICD-10-CM | POA: Diagnosis not present

## 2017-08-18 DIAGNOSIS — M25552 Pain in left hip: Secondary | ICD-10-CM | POA: Diagnosis not present

## 2017-08-24 DIAGNOSIS — M79641 Pain in right hand: Secondary | ICD-10-CM | POA: Diagnosis not present

## 2017-09-30 DIAGNOSIS — M545 Low back pain: Secondary | ICD-10-CM | POA: Diagnosis not present

## 2017-09-30 DIAGNOSIS — M5136 Other intervertebral disc degeneration, lumbar region: Secondary | ICD-10-CM | POA: Diagnosis not present

## 2017-09-30 DIAGNOSIS — M4316 Spondylolisthesis, lumbar region: Secondary | ICD-10-CM | POA: Diagnosis not present

## 2017-10-13 ENCOUNTER — Encounter: Payer: Medicare Other | Admitting: Family Medicine

## 2017-11-01 ENCOUNTER — Other Ambulatory Visit: Payer: Self-pay

## 2017-11-01 ENCOUNTER — Encounter (HOSPITAL_BASED_OUTPATIENT_CLINIC_OR_DEPARTMENT_OTHER): Payer: Self-pay | Admitting: Emergency Medicine

## 2017-11-01 ENCOUNTER — Inpatient Hospital Stay (HOSPITAL_BASED_OUTPATIENT_CLINIC_OR_DEPARTMENT_OTHER)
Admission: EM | Admit: 2017-11-01 | Discharge: 2017-11-03 | DRG: 309 | Disposition: A | Discharge status: Home / Self Care | Payer: Medicare Other | Attending: Internal Medicine | Admitting: Internal Medicine

## 2017-11-01 ENCOUNTER — Ambulatory Visit: Payer: Self-pay | Admitting: *Deleted

## 2017-11-01 ENCOUNTER — Emergency Department (HOSPITAL_BASED_OUTPATIENT_CLINIC_OR_DEPARTMENT_OTHER): Payer: Medicare Other

## 2017-11-01 DIAGNOSIS — Z85528 Personal history of other malignant neoplasm of kidney: Secondary | ICD-10-CM

## 2017-11-01 DIAGNOSIS — Z8051 Family history of malignant neoplasm of kidney: Secondary | ICD-10-CM | POA: Diagnosis not present

## 2017-11-01 DIAGNOSIS — Z86711 Personal history of pulmonary embolism: Secondary | ICD-10-CM | POA: Diagnosis not present

## 2017-11-01 DIAGNOSIS — M79652 Pain in left thigh: Secondary | ICD-10-CM | POA: Diagnosis present

## 2017-11-01 DIAGNOSIS — I4892 Unspecified atrial flutter: Principal | ICD-10-CM | POA: Diagnosis present

## 2017-11-01 DIAGNOSIS — I4891 Unspecified atrial fibrillation: Secondary | ICD-10-CM | POA: Diagnosis present

## 2017-11-01 DIAGNOSIS — R002 Palpitations: Secondary | ICD-10-CM | POA: Diagnosis not present

## 2017-11-01 DIAGNOSIS — Z85858 Personal history of malignant neoplasm of other endocrine glands: Secondary | ICD-10-CM | POA: Diagnosis not present

## 2017-11-01 DIAGNOSIS — Z7951 Long term (current) use of inhaled steroids: Secondary | ICD-10-CM

## 2017-11-01 DIAGNOSIS — G4733 Obstructive sleep apnea (adult) (pediatric): Secondary | ICD-10-CM | POA: Diagnosis not present

## 2017-11-01 DIAGNOSIS — I5032 Chronic diastolic (congestive) heart failure: Secondary | ICD-10-CM | POA: Diagnosis not present

## 2017-11-01 DIAGNOSIS — Z79899 Other long term (current) drug therapy: Secondary | ICD-10-CM | POA: Diagnosis not present

## 2017-11-01 DIAGNOSIS — Z7901 Long term (current) use of anticoagulants: Secondary | ICD-10-CM | POA: Diagnosis not present

## 2017-11-01 DIAGNOSIS — E039 Hypothyroidism, unspecified: Secondary | ICD-10-CM | POA: Diagnosis not present

## 2017-11-01 DIAGNOSIS — I110 Hypertensive heart disease with heart failure: Secondary | ICD-10-CM | POA: Diagnosis not present

## 2017-11-01 DIAGNOSIS — R0602 Shortness of breath: Secondary | ICD-10-CM | POA: Diagnosis not present

## 2017-11-01 DIAGNOSIS — I10 Essential (primary) hypertension: Secondary | ICD-10-CM | POA: Diagnosis not present

## 2017-11-01 DIAGNOSIS — C61 Malignant neoplasm of prostate: Secondary | ICD-10-CM | POA: Diagnosis present

## 2017-11-01 DIAGNOSIS — I2782 Chronic pulmonary embolism: Secondary | ICD-10-CM | POA: Diagnosis present

## 2017-11-01 DIAGNOSIS — Z87891 Personal history of nicotine dependence: Secondary | ICD-10-CM

## 2017-11-01 DIAGNOSIS — Z905 Acquired absence of kidney: Secondary | ICD-10-CM | POA: Diagnosis not present

## 2017-11-01 DIAGNOSIS — Z8546 Personal history of malignant neoplasm of prostate: Secondary | ICD-10-CM | POA: Diagnosis present

## 2017-11-01 DIAGNOSIS — Z6841 Body Mass Index (BMI) 40.0 and over, adult: Secondary | ICD-10-CM | POA: Diagnosis not present

## 2017-11-01 DIAGNOSIS — M79609 Pain in unspecified limb: Secondary | ICD-10-CM | POA: Diagnosis not present

## 2017-11-01 DIAGNOSIS — Z96653 Presence of artificial knee joint, bilateral: Secondary | ICD-10-CM | POA: Diagnosis not present

## 2017-11-01 DIAGNOSIS — Z7982 Long term (current) use of aspirin: Secondary | ICD-10-CM

## 2017-11-01 DIAGNOSIS — R61 Generalized hyperhidrosis: Secondary | ICD-10-CM | POA: Diagnosis not present

## 2017-11-01 DIAGNOSIS — R000 Tachycardia, unspecified: Secondary | ICD-10-CM | POA: Diagnosis not present

## 2017-11-01 LAB — HEPATIC FUNCTION PANEL
ALT: 34 U/L (ref 17–63)
AST: 27 U/L (ref 15–41)
Albumin: 3.3 g/dL — ABNORMAL LOW (ref 3.5–5.0)
Alkaline Phosphatase: 69 U/L (ref 38–126)
Bilirubin, Direct: <0.1 mg/dL — ABNORMAL LOW (ref 0.1–0.5)
TOTAL PROTEIN: 6.9 g/dL (ref 6.5–8.1)
Total Bilirubin: 0.7 mg/dL (ref 0.3–1.2)

## 2017-11-01 LAB — BASIC METABOLIC PANEL
Anion gap: 12 (ref 5–15)
BUN: 17 mg/dL (ref 6–20)
CALCIUM: 8.7 mg/dL — AB (ref 8.9–10.3)
CHLORIDE: 98 mmol/L — AB (ref 101–111)
CO2: 23 mmol/L (ref 22–32)
CREATININE: 1.26 mg/dL — AB (ref 0.61–1.24)
GFR calc Af Amer: >60 mL/min (ref >60)
GFR, EST NON AFRICAN AMERICAN: 54 mL/min — AB (ref >60)
Glucose, Bld: 111 mg/dL — ABNORMAL HIGH (ref 65–99)
Potassium: 4.3 mmol/L (ref 3.5–5.1)
SODIUM: 133 mmol/L — AB (ref 135–145)

## 2017-11-01 LAB — CBC
HCT: 50.3 % (ref 39.0–52.0)
Hemoglobin: 17.9 g/dL — ABNORMAL HIGH (ref 13.0–17.0)
MCH: 31.3 pg (ref 26.0–34.0)
MCHC: 35.6 g/dL (ref 30.0–36.0)
MCV: 88.1 fL (ref 78.0–100.0)
PLATELETS: 328 10*3/uL (ref 150–400)
RBC: 5.71 MIL/uL (ref 4.22–5.81)
RDW: 14.2 % (ref 11.5–15.5)
WBC: 12.2 10*3/uL — AB (ref 4.0–10.5)

## 2017-11-01 LAB — TROPONIN I: Troponin I: <0.03 ng/mL (ref <0.03)

## 2017-11-01 LAB — MAGNESIUM: Magnesium: 1.9 mg/dL (ref 1.7–2.4)

## 2017-11-01 LAB — D-DIMER, QUANTITATIVE (NOT AT ARMC): D DIMER QUANT: 0.52 ug{FEU}/mL — AB (ref 0.00–0.50)

## 2017-11-01 MED ORDER — MORPHINE SULFATE (PF) 2 MG/ML IV SOLN
1.0000 mg | Freq: Once | INTRAVENOUS | Status: AC
  Administered 2017-11-02: 1 mg via INTRAVENOUS
  Filled 2017-11-01: qty 1

## 2017-11-01 MED ORDER — ESCITALOPRAM OXALATE 20 MG PO TABS
20.0000 mg | ORAL_TABLET | Freq: Every day | ORAL | Status: DC
  Administered 2017-11-02 – 2017-11-03 (×2): 20 mg via ORAL
  Filled 2017-11-01 (×2): qty 1

## 2017-11-01 MED ORDER — DILTIAZEM HCL ER COATED BEADS 120 MG PO CP24
120.0000 mg | ORAL_CAPSULE | Freq: Every day | ORAL | Status: DC
  Administered 2017-11-01 – 2017-11-03 (×3): 120 mg via ORAL
  Filled 2017-11-01 (×3): qty 1

## 2017-11-01 MED ORDER — ONDANSETRON HCL 4 MG/2ML IJ SOLN
4.0000 mg | Freq: Once | INTRAMUSCULAR | Status: AC
  Administered 2017-11-01: 4 mg via INTRAVENOUS

## 2017-11-01 MED ORDER — ADULT MULTIVITAMIN W/MINERALS CH
1.0000 | ORAL_TABLET | Freq: Every day | ORAL | Status: DC
Start: 2017-11-02 — End: 2017-11-04
  Administered 2017-11-02 – 2017-11-03 (×2): 1 via ORAL
  Filled 2017-11-01 (×2): qty 1

## 2017-11-01 MED ORDER — DILTIAZEM LOAD VIA INFUSION
15.0000 mg | Freq: Once | INTRAVENOUS | Status: AC
Start: 2017-11-01 — End: 2017-11-01
  Administered 2017-11-01: 15 mg via INTRAVENOUS
  Filled 2017-11-01: qty 15

## 2017-11-01 MED ORDER — DILTIAZEM HCL 100 MG IV SOLR
INTRAVENOUS | Status: AC
  Administered 2017-11-01: 5 mg via INTRAVENOUS
  Filled 2017-11-01: qty 100

## 2017-11-01 MED ORDER — ENOXAPARIN SODIUM 150 MG/ML ~~LOC~~ SOLN
1.0000 mg/kg | Freq: Once | SUBCUTANEOUS | Status: AC
  Administered 2017-11-01: 130 mg via SUBCUTANEOUS
  Filled 2017-11-01: qty 1

## 2017-11-01 MED ORDER — ONDANSETRON HCL 4 MG/2ML IJ SOLN: 4.0000 mg | Freq: Four times a day (QID) | INTRAMUSCULAR | Status: DC | PRN

## 2017-11-01 MED ORDER — ACETAMINOPHEN 325 MG PO TABS
650.0000 mg | ORAL_TABLET | Freq: Four times a day (QID) | ORAL | Status: DC | PRN
  Administered 2017-11-01 – 2017-11-02 (×3): 650 mg via ORAL
  Filled 2017-11-01 (×3): qty 2

## 2017-11-01 MED ORDER — NORTRIPTYLINE HCL 25 MG PO CAPS
75.0000 mg | ORAL_CAPSULE | Freq: Every day | ORAL | Status: DC
  Administered 2017-11-01 – 2017-11-02 (×2): 75 mg via ORAL
  Filled 2017-11-01 (×3): qty 3

## 2017-11-01 MED ORDER — DILTIAZEM HCL 100 MG IV SOLR
5.0000 mg/h | INTRAVENOUS | Status: DC
  Administered 2017-11-01: 5 mg via INTRAVENOUS
  Filled 2017-11-01: qty 100

## 2017-11-01 MED ORDER — ADENOSINE 6 MG/2ML IV SOLN
INTRAVENOUS | Status: AC
  Filled 2017-11-01: qty 8

## 2017-11-01 MED ORDER — ACETAMINOPHEN 650 MG RE SUPP: 650.0000 mg | Freq: Four times a day (QID) | RECTAL | Status: DC | PRN

## 2017-11-01 MED ORDER — LOSARTAN POTASSIUM 50 MG PO TABS
50.0000 mg | ORAL_TABLET | Freq: Every day | ORAL | Status: DC
  Administered 2017-11-03: 50 mg via ORAL
  Filled 2017-11-01 (×2): qty 1

## 2017-11-01 MED ORDER — APIXABAN 5 MG PO TABS
5.0000 mg | ORAL_TABLET | Freq: Two times a day (BID) | ORAL | Status: DC
  Administered 2017-11-01 – 2017-11-03 (×4): 5 mg via ORAL
  Filled 2017-11-01 (×5): qty 1

## 2017-11-01 MED ORDER — ORAL CARE MOUTH RINSE
15.0000 mL | Freq: Two times a day (BID) | OROMUCOSAL | Status: DC
  Administered 2017-11-02: 15 mL via OROMUCOSAL

## 2017-11-01 MED ORDER — ADENOSINE 6 MG/2ML IV SOLN
INTRAVENOUS | Status: AC
  Filled 2017-11-01: qty 6

## 2017-11-01 MED ORDER — ONDANSETRON HCL 4 MG/2ML IJ SOLN
INTRAMUSCULAR | Status: AC
  Filled 2017-11-01: qty 2

## 2017-11-01 MED ORDER — LEVOTHYROXINE SODIUM 75 MCG PO TABS
150.0000 ug | ORAL_TABLET | Freq: Every day | ORAL | Status: DC
Start: 2017-11-02 — End: 2017-11-04
  Administered 2017-11-02 – 2017-11-03 (×2): 150 ug via ORAL
  Filled 2017-11-01 (×2): qty 2

## 2017-11-01 MED ORDER — ONDANSETRON HCL 4 MG PO TABS: 4.0000 mg | ORAL_TABLET | Freq: Four times a day (QID) | ORAL | Status: DC | PRN

## 2017-11-01 MED ORDER — TRAZODONE HCL 50 MG PO TABS
50.0000 mg | ORAL_TABLET | Freq: Every day | ORAL | Status: DC
  Administered 2017-11-01 – 2017-11-02 (×2): 50 mg via ORAL
  Filled 2017-11-01 (×2): qty 1

## 2017-11-01 MED ORDER — ADENOSINE 6 MG/2ML IV SOLN
INTRAVENOUS | Status: AC
  Administered 2017-11-01: 6 mg
  Filled 2017-11-01: qty 10

## 2017-11-01 NOTE — Progress Notes (Signed)
Admitting MD has been in to see pt, cardizem drip weaned and turned off per order along with admin of PO cardizem. Maintaining NSR 80s. BP stable 124/77. Pt only c/o feeling tired, no CP or SOB. Will continue to monitor.  Jaymes Graff, RN

## 2017-11-01 NOTE — ED Notes (Signed)
Pt. Is resting on stretcher at this time with wife and son at bedside.

## 2017-11-01 NOTE — ED Provider Notes (Signed)
[place] EMERGENCY DEPARTMENT Provider Note   CSN: [id number] Arrival date & time: 11/01/17  1417     History   Chief Complaint Chief Complaint  Patient presents with  . Palpitations    HPI Wayne Booth is a 77 y.o. male.  The history is provided by the patient.  Palpitations   This is a recurrent problem. The current episode started more than 1 week ago. The problem occurs daily. The problem has been gradually worsening. The problem is associated with exercise. On average, each episode lasts 4 hours. Associated symptoms include diaphoresis, malaise/fatigue, chest pain, chest pressure, irregular heartbeat, near-syncope, nausea, back pain, weakness and shortness of breath. Pertinent negatives include no lower extremity edema. He has tried nothing for the symptoms. The treatment provided no relief. There are no known risk factors.    Past Medical History:  Diagnosis Date  . Allergy    States his nose runs when he is around grease.   Marland Kitchen Anxiety   . Arthritis   . Cancer ([place])   . History of total knee replacement   . Hypertension   . Hypothyroidism   . Kidney cancer, primary, with metastasis from kidney to other site [facility])   . Kidney stone   . OSA (obstructive sleep apnea)    pt does not wear cpap at night  . Pituitary cyst ([place])   . Pneumonia    hx of  . Prostate cancer [facility])     Patient Active Problem List   Diagnosis Date Noted  . Atrial flutter with rapid ventricular response ([place]) 11/01/2017  . Bilateral foot pain 07/13/2017  . Community acquired pneumonia 06/18/2017  . Shortness of breath 06/14/2017  . History of adenomatous polyp of colon 04/12/2017  . History of pulmonary embolism 10/23/2016  . Cervical disc disease 11/13/2015  . Essential tremor 08/14/2015  . Gout 08/14/2015  . Hypothyroidism 07/12/2014  . Hyperglycemia 07/12/2014  . GAD (generalized anxiety disorder) 11/07/2013  . OSA (obstructive sleep apnea) 07/07/2011  . Asthmatic  bronchitis 06/09/2011  . Actinic keratosis 11/27/2009  . Osteoarthritis 11/13/2008  . 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 Morbid obesity ([place]) 10/02/2008  . History of renal cell carcinoma 12/26/2007  . TESTOSTERONE DEFICIENCY 12/28/2006  . Essential hypertension 12/28/2006    Past Surgical History:  Procedure Laterality Date  . ANTERIOR CERVICAL DECOMP/DISCECTOMY FUSION N/A 08/29/2012   Procedure: ANTERIOR CERVICAL DECOMPRESSION/DISCECTOMY FUSION 1 LEVEL;  Surgeon: Eustace Moore, MD;  Location: [place] NEURO ORS;  Service: Neurosurgery;  Laterality: N/A;  . APPENDECTOMY    . CARDIAC CATHETERIZATION     no PCI  . CHOLECYSTECTOMY    . COLONOSCOPY W/ POLYPECTOMY    . CRANIOTOMY N/A 11/08/2012   Procedure: CRANIOTOMY HYPOPHYSECTOMY TRANSNASAL APPROACH;  Surgeon: Faythe Ghee, MD;  Location: [place] NEURO ORS;  Service: Neurosurgery;  Laterality: N/A;  Transphenoidal Resection of Pituitary Tumor  . INSERTION PROSTATE RADIATION SEED    . KIDNEY STONE SURGERY    . KNEE ARTHROSCOPY  2007   left  . NEPHRECTOMY Left   . POSTERIOR CERVICAL LAMINECTOMY Left 04/24/2015   Procedure: Foraminotomy cervical five - cervical six cervical six - cervical seven left;  Surgeon: Eustace Moore, MD;  Location: [facility];  Service: Neurosurgery;  Laterality: Left;  . REPLACEMENT TOTAL KNEE BILATERAL          Home Medications    Prior to Admission medications   Medication Sig Start Date End Date Taking? Authorizing Provider  albuterol (PROVENTIL  HFA;VENTOLIN HFA) 108 (90 Base) MCG/ACT inhaler Inhale 2 puffs into the lungs every 6 (six) hours as needed for wheezing or shortness of breath. 08/11/17   Rigoberto Noel, MD  aspirin 325 MG tablet Take 325 mg by mouth daily.    [provider]  budesonide-formoterol (SYMBICORT) 160-4.5 MCG/ACT inhaler Inhale 2 puffs into the lungs 2 (two) times daily. 08/11/17   Rigoberto Noel, MD  clotrimazole-betamethasone (LOTRISONE) cream Apply 1 application topically 2  (two) times daily. Use for 10 days maximum. Patient taking differently: Apply 1 application topically 2 (two) times daily as needed. Use for 10 days maximum. 08/27/16   Marin Olp, MD  escitalopram (LEXAPRO) 20 MG tablet Take 1 tablet (20 mg total) by mouth daily. TAKE 20 mg every day 08/12/16   Marin Olp, MD  hydrochlorothiazide (HYDRODIURIL) 25 MG tablet TAKE 1 TABLET (25 MG TOTAL) BY MOUTH EVERY MORNING. 08/12/17   Marin Olp, MD  levothyroxine (SYNTHROID, LEVOTHROID) 150 MCG tablet TAKE ONE TABLET BY MOUTH DAILY 07/06/17   Marin Olp, MD  losartan (COZAAR) 50 MG tablet Take 1 tablet (50 mg total) by mouth daily. 02/03/17   Marin Olp, MD  Multiple Vitamins-Minerals (MULTIVITAMINS THER. W/MINERALS) TABS Take 1 tablet by mouth daily.    [provider]  nortriptyline (PAMELOR) 75 MG capsule TAKE ONE CAPSULE BY MOUTH AT BEDTIME 06/09/17   Marin Olp, MD  traZODone (DESYREL) 50 MG tablet TAKE ONE HALF TO ONE TABLET BY MOUTH EVERY NIGHT AT BEDTIME AS NEEDED FOR SLEEP 06/09/17   Marin Olp, MD    Family History Family History  Problem Relation Age of Onset  . Cancer Father        ?mouth, died before patient born  . Cancer Mother        stomach, died when he was young  . Kidney cancer Sister   . Colon cancer Brother 55  . Esophageal cancer Neg Hx   . Rectal cancer Neg Hx   . Stomach cancer Neg Hx     Social History Social History   Tobacco Use  . Smoking status: Former Smoker    Years: 4.00    Types: Cigars  . Smokeless tobacco: Never Used  . Tobacco comment: 1 cigar per month, with golf, last one was 3-4 months.   Substance Use Topics  . Alcohol use: Yes    Alcohol/week: 1.2 oz    Types: 2 Shots of liquor per week    Comment: socially  . Drug use: No     Allergies   Patient has no known allergies.   Review of Systems Review of Systems  Constitutional: Positive for diaphoresis and malaise/fatigue.  Respiratory: Positive  for shortness of breath.   Cardiovascular: Positive for chest pain, palpitations and near-syncope.  Gastrointestinal: Positive for nausea.  Musculoskeletal: Positive for back pain.  Neurological: Positive for weakness.  All other systems reviewed and are negative.    Physical Exam Updated Vital Signs BP 116/67   Pulse (!) 34   Temp 98.6 F (37 C) (Oral)   Resp (!) 21   Ht 5\' 11"  (1.803 m)   Wt 131.5 kg (290 lb)   SpO2 93%   BMI 40.45 kg/m   Physical Exam  Constitutional: He is oriented to person, place, and time. He appears well-developed and well-nourished. He appears distressed.  HENT:  Head: Normocephalic and atraumatic.  Eyes: Conjunctivae and EOM are normal.  Neck: Normal range of motion.  Cardiovascular: Regular rhythm. Tachycardia present.  Pulmonary/Chest: Effort normal. No respiratory distress.  Abdominal: Soft. Bowel sounds are normal. He exhibits no distension.  Musculoskeletal: Normal range of motion. He exhibits no edema or deformity.  Neurological: He is alert and oriented to person, place, and time. No cranial nerve deficit. Coordination normal.  Skin: Skin is warm. He is diaphoretic.  Nursing note and vitals reviewed.    ED Treatments / Results  Labs (all labs ordered are listed, but only abnormal results are displayed) Labs Reviewed  BASIC METABOLIC PANEL - Abnormal; Notable for the following components:      Result Value   Sodium 133 (*)    Chloride 98 (*)    Glucose, Bld 111 (*)    Creatinine, Ser 1.26 (*)    Calcium 8.7 (*)    GFR calc non Af Amer 54 (*)    All other components within normal limits  CBC - Abnormal; Notable for the following components:   WBC 12.2 (*)    Hemoglobin 17.9 (*)    All other components within normal limits  TROPONIN I    EKG EKG Interpretation  Date/Time:  Monday Nov 01 2017 14:25:24 EDT Ventricular Rate:  155 PR Interval:    QRS Duration: 90 QT Interval:  314 QTC Calculation: 504 R Axis:   14 Text  Interpretation:  Supraventricular tachycardia Low voltage QRS Cannot rule out Anteroseptal infarct , age undetermined Abnormal ECG fast rhythm replaced sinus rhythm since october 2016 Confirmed by Merrily Pew [phone]) on 11/01/2017 3:32:09 PM   EKG Interpretation  Date/Time:  Monday Nov 01 2017 15:05:22 EDT Ventricular Rate:  152 PR Interval:    QRS Duration: 102 QT Interval:  328 QTC Calculation: 522 R Axis:   30 Text Interpretation:  Atrial flutter  with rapid V-rate Inferior infarct, old Confirmed by Merrily Pew [phone]) on 11/01/2017 4:55:14 PM           Radiology Dg Chest Port 1 View  Result Date: 11/01/2017 CLINICAL DATA:  77 year old male with palpitations, dizziness, tachycardia, shortness of breath. EXAM: PORTABLE CHEST 1 VIEW COMPARISON:  CTA chest 10/20/2016 and earlier. FINDINGS: Portable AP semi upright view at 1511 hours. Mildly lower lung volumes. Stable cardiac size and mediastinal contours. Pacer or resuscitation pads project over the left chest. No pneumothorax, pulmonary edema, pleural effusion or confluent pulmonary opacity. Visualized tracheal air column is within normal limits. Cervical ACDF hardware. No acute osseous abnormality identified. IMPRESSION: Lower lung volumes.  No acute cardiopulmonary abnormality. Electronically Signed   By: Genevie Ann M.D.   On: 11/01/2017 15:30    Procedures Procedures (including critical care time)  CRITICAL CARE Performed by: Merrily Pew Total critical care time: 35 minutes Critical care time was exclusive of separately billable procedures and treating other patients. Critical care was necessary to treat or prevent imminent or life-threatening deterioration. Critical care was time spent personally by me on the following activities: development of treatment plan with patient and/or surrogate as well as nursing, discussions with consultants, evaluation of patient's response to treatment, examination of patient, obtaining history  from patient or surrogate, ordering and performing treatments and interventions, ordering and review of laboratory studies, ordering and review of radiographic studies, pulse oximetry and re-evaluation of patient's condition.  Adenosine Cardioversion Attempted cardioversion with 6 mg adenosine.  Patient was fevers were attempted to include leg raise and modified Valsalva and carotid massage without any success.  Fluids were initiated.  Blood pressure was stable.  6 mg adenosine was  administered as a push followed by a fluid bolus.  Direct effect of total AV block achieved which revealed underlying atrial flutter rate the patient resumed in that.  No complications.  No arrhythmias.  Patient tolerated procedure well.  Medications Ordered in ED Medications  diltiazem (CARDIZEM) 1 mg/mL load via infusion 15 mg (15 mg Intravenous Bolus from Bag 11/01/17 1501)    And  diltiazem (CARDIZEM) 100 mg in dextrose 5 % 100 mL (1 mg/mL) infusion (10 mg/hr Intravenous Transfusing/Transfer 11/01/17 1645)  apixaban (ELIQUIS) tablet 5 mg (5 mg Oral Not Given 11/01/17 1602)  adenosine (ADENOCARD) 6 MG/2ML injection (6 mg  Given 11/01/17 1457)  ondansetron (ZOFRAN) injection 4 mg (4 mg Intravenous Given 11/01/17 1544)  enoxaparin (LOVENOX) injection 130 mg (130 mg Subcutaneous Given 11/01/17 1654)     Initial Impression / Assessment and Plan / ED Course  I have reviewed the triage vital signs and the nursing notes.  Pertinent labs & imaging results that were available during my care of the patient were reviewed by me and considered in my medical decision making (see chart for details).     Patient is a SVT that vagal maneuvers did not work.  Adenosine was attempted but showed underlying rhythm as above of what appeared to be atrial flutter.  Patient then given 20 mg Cardizem bolus and started on a Cardizem drip.  Subsequently heart rates improved to low 100s patient had some improvement but still feeling kind of "washed  out".  Suspect this is just fatigue from his heartbeat so fast.  Discussed with hospitalist who will admit for same.  Final Clinical Impressions(s) / ED Diagnoses   Final diagnoses:  Atrial flutter with rapid ventricular response [facility])    ED Discharge Orders    None       Kindrick Lankford, Corene Cornea, MD 11/01/17 1658

## 2017-11-01 NOTE — ED Triage Notes (Signed)
Patient states that he has had a Rapid heart beat and dizzy spells "for a while" - the patient states that hsi hear rate has been high for the last week  - He has SOB and feels washed out when walking

## 2017-11-01 NOTE — Telephone Encounter (Addendum)
He called in c/o dizziness, weakness and a rapid heart rate 156 via Apple watch right now.   He is in the car, not driving.   When I stand up to get out of the car I'm so weak and dizzy.   "I'm just so washed out".  These episodes started a week and a half ago and have been getting progressively worse.    They were near [place] and he did not want to go to [facility] or [place] EDs due to the long wait times.   I encouraged him to go to the ED at [facility] which was not far from his location.  He agreed to go there.   I let him know his symptoms were concerning and that he needed to be evaluated.   He wanted to wait until his 10:15 appt with Dr. Yong Channel tomorrow but I emphasized the need to go to the ED.  I have routed a note to Dr. Yong Channel letting him know.    He has an appt with Dr. Yong Channel for 11/02/17  At 10:15am.    I did not cancel this appt at this time.     Reason for Disposition . Patient sounds very sick or weak to the triager    Heart rate 156 right now via Apple watch.   Dizzy and weak.  Answer Assessment - Initial Assessment Questions 1. DESCRIPTION: "Describe your dizziness."     I feel out of balance when it happens.   It happened this morning.   Went out to lunch.   When got out of the car I almost passed out today. 2. LIGHTHEADED: "Do you feel lightheaded?" (e.g., somewhat faint, woozy, weak upon standing)     I have an Apple watch.   My pulse 160s.    156 now.   I'm sitting down now but I get dizzy.   I was in [facility] fishing a week half ago.   Started.   I'm washed out.    3. VERTIGO: "Do you feel like either you or the room is spinning or tilting?" (i.e. vertigo)     No just dizzy and washed out. 4. SEVERITY: "How bad is it?"  "Do you feel like you are going to faint?" "Can you stand and walk?"   - MILD - walking normally   - MODERATE - interferes with normal activities (e.g., work, school)    - SEVERE - unable to stand, requires support to walk, feels like  passing out now.      I feel so weak.    5. ONSET:  "When did the dizziness begin?"     About a week and a half ago it started but it is getting progressively worse. 6. AGGRAVATING FACTORS: "Does anything make it worse?" (e.g., standing, change in head position)     No 7. HEART RATE: "Can you tell me your heart rate?" "How many beats in 15 seconds?"  (Note: not all patients can do this)       156 right now via Apple watch.   "I'm sitting in the car now near [place]".   If I get out of the car I'm so weak and dizzy. 8. CAUSE: "What do you think is causing the dizziness?"     Not asked 9. RECURRENT SYMPTOM: "Have you had dizziness before?" If so, ask: "When was the last time?" "What happened that time?"     No 10. OTHER SYMPTOMS: "  Do you have any other symptoms?" (e.g., fever, chest pain, vomiting, diarrhea, bleeding)       Weak, "washed out" and a mild headache. 11. PREGNANCY: "Is there any chance you are pregnant?" "When was your last menstrual period?"       N/A  Protocols used: DIZZINESS [facility]

## 2017-11-01 NOTE — ED Notes (Signed)
Pt. Converted just before leaving [facility] rate 88 SR

## 2017-11-01 NOTE — Telephone Encounter (Signed)
Forwarding to Dr. Hunter as FYI.  

## 2017-11-01 NOTE — ED Notes (Signed)
In to see Pt.   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  Will obtain report from team members who have been taking care of Pt. Before RN Rosana Hoes to care for the Pt.

## 2017-11-01 NOTE — Progress Notes (Signed)
Patient with afluter with RVR.  Palpitations for days, consistent for the last day.  Apple Watch reports HR in 160-180 range.  Has pre-syncope related.  Tried vagal manuevers.  Had 1 dose of Adenosine, obvious aflutter on strip once his heart rate slowed down.  Diltiazem bolus and infusion.  Now 100-105 range.  BPs are fine.  Symptoms improving but feels washed out.  No chest pain.  No h/o similar.  Will give treatment-dose Lovenox x 1, since Eliquis is not available at [facility] at this time.  Will accept to SDU at this time.  He is likely to need Echo and cardiology consultation upon arrival.  Carlyon Shadow, M.D

## 2017-11-01 NOTE — H&P (Addendum)
History and Physical    Wayne Booth:[id number] DOB: Nov 26, 1940 DOA: 11/01/2017  PCP: Marin Olp, MD  Patient coming from: Home.  Chief Complaint: Fatigue and elevated heart rate.  HPI: Wayne Booth is a 77 y.o. male with history of pituitary tumor status post transsphenoidal resection on Synthroid, hypertension, sleep apnea history of renal cell carcinoma status post nephrectomy presented to the ER admits in [facility] with complaints of fatigue weakness tachycardia and palpitations.  Patient states over the last 2 weeks patient has been feeling intensely fatigued and his apple watch has been stating that he has been having elevated heart rate.  Patient gets easily short of breath on exertion.  At times he got chest pain and also dizziness.  Did not lose consciousness.  Today patient became very fatigued and decided to come to the ER after speaking to the PCPs office.  Since this morning patient also has been having pain in the left thigh area.  Denies any fall trauma or insect bite.  No rash.  ED Course: In the ER patient was found to be in atrial flutter with RVR and was started on Cardizem infusion and apixaban.  By time I examined the patient patient's rhythm was changed to sinus.  Patient admitted for further management.  Review of Systems: As per HPI, rest all negative.   Past Medical History:  Diagnosis Date  . Allergy    States his nose runs when he is around grease.   Marland Kitchen Anxiety   . Arthritis   . Cancer ([place])   . History of total knee replacement   . Hypertension   . Hypothyroidism   . Kidney cancer, primary, with metastasis from kidney to other site [facility])   . Kidney stone   . OSA (obstructive sleep apnea)    pt does not wear cpap at night  . Pituitary cyst ([place])   . Pneumonia    hx of  . Prostate cancer [facility])     Past Surgical History:  Procedure Laterality Date  . ANTERIOR CERVICAL DECOMP/DISCECTOMY FUSION N/A 08/29/2012   Procedure: ANTERIOR  CERVICAL DECOMPRESSION/DISCECTOMY FUSION 1 LEVEL;  Surgeon: Eustace Moore, MD;  Location: [place] [facility];  Service: Neurosurgery;  Laterality: N/A;  . APPENDECTOMY    . CARDIAC CATHETERIZATION     no PCI  . CHOLECYSTECTOMY    . COLONOSCOPY W/ POLYPECTOMY    . CRANIOTOMY N/A 11/08/2012   Procedure: CRANIOTOMY HYPOPHYSECTOMY TRANSNASAL APPROACH;  Surgeon: Faythe Ghee, MD;  Location: [place] [facility] OR[facility];  Service: Neurosurgery;  Laterality: N/A;  Transphenoidal Resection of Pituitary Tumor  . INSERTION PROSTATE RADIATION SEED    . KIDNEY STONE SURGERY    . KNEE ARTHROSCOPY  2007   left  . NEPHRECTOMY Left   . POSTERIOR CERVICAL LAMINECTOMY Left 04/24/2015   Procedure: Foraminotomy cervical five - cervical six cervical six - cervical seven left;  Surgeon: Eustace Moore, MD;  Location: [facility];  Service: Neurosurgery;  Laterality: Left;  . REPLACEMENT TOTAL KNEE BILATERAL       reports that he has quit smoking. His smoking use included cigars. He quit after 4.00 years of use. He has never used smokeless tobacco. He reports that he drinks about 1.2 oz of alcohol per week. He reports that he does not use drugs.  No Known Allergies  Family History  Problem Relation Age of Onset  . Cancer Father        ?mouth, died before patient  born  . Cancer Mother        stomach, died when he was young  . Kidney cancer Sister   . Colon cancer Brother 47  . Esophageal cancer Neg Hx   . Rectal cancer Neg Hx   . Stomach cancer Neg Hx     Prior to Admission medications   Medication Sig Start Date End Date Taking? Authorizing Provider  albuterol (PROVENTIL HFA;VENTOLIN HFA) 108 (90 Base) MCG/ACT inhaler Inhale 2 puffs into the lungs every 6 (six) hours as needed for wheezing or shortness of breath. 08/11/17   Rigoberto Noel, MD  aspirin 325 MG tablet Take 325 mg by mouth daily.    [provider]  budesonide-formoterol (SYMBICORT) 160-4.5 MCG/ACT inhaler Inhale 2 puffs into the lungs 2 (two) times  daily. 08/11/17   Rigoberto Noel, MD  clotrimazole-betamethasone (LOTRISONE) cream Apply 1 application topically 2 (two) times daily. Use for 10 days maximum. Patient taking differently: Apply 1 application topically 2 (two) times daily as needed. Use for 10 days maximum. 08/27/16   Marin Olp, MD  escitalopram (LEXAPRO) 20 MG tablet Take 1 tablet (20 mg total) by mouth daily. TAKE 20 mg every day 08/12/16   Marin Olp, MD  hydrochlorothiazide (HYDRODIURIL) 25 MG tablet TAKE 1 TABLET (25 MG TOTAL) BY MOUTH EVERY MORNING. 08/12/17   Marin Olp, MD  levothyroxine (SYNTHROID, LEVOTHROID) 150 MCG tablet TAKE ONE TABLET BY MOUTH DAILY 07/06/17   Marin Olp, MD  losartan (COZAAR) 50 MG tablet Take 1 tablet (50 mg total) by mouth daily. 02/03/17   Marin Olp, MD  Multiple Vitamins-Minerals (MULTIVITAMINS THER. W/MINERALS) TABS Take 1 tablet by mouth daily.    [provider]  nortriptyline (PAMELOR) 75 MG capsule TAKE ONE CAPSULE BY MOUTH AT BEDTIME 06/09/17   Marin Olp, MD  traZODone (DESYREL) 50 MG tablet TAKE ONE HALF TO ONE TABLET BY MOUTH EVERY NIGHT AT BEDTIME AS NEEDED FOR SLEEP 06/09/17   Marin Olp, MD    Physical Exam: Vitals:   11/01/17 1739 11/01/17 1800 11/01/17 1832 11/01/17 1929  BP: 139/88  131/77 120/76  Pulse: 88  99 92  Resp:    19  Temp:  98.4 F (36.9 C) 98.4 F (36.9 C) 97.8 F (36.6 C)  TempSrc:  Oral Oral Oral  SpO2: 98%  94% 98%  Weight:   125.1 kg (275 lb 12.7 oz)   Height:   5\' 11"  (1.803 m)       Constitutional: Moderately built and nourished. Vitals:   11/01/17 1739 11/01/17 1800 11/01/17 1832 11/01/17 1929  BP: 139/88  131/77 120/76  Pulse: 88  99 92  Resp:    19  Temp:  98.4 F (36.9 C) 98.4 F (36.9 C) 97.8 F (36.6 C)  TempSrc:  Oral Oral Oral  SpO2: 98%  94% 98%  Weight:   125.1 kg (275 lb 12.7 oz)   Height:   5\' 11"  (1.803 m)    Eyes: Anicteric no pallor. ENMT: No discharge from the ears eyes  nose or mouth. Neck: No mass palpated no JVD appreciated. Respiratory: No rhonchi or crepitations. Cardiovascular: S1-S2 heard no murmurs appreciated. Abdomen: Soft nontender bowel sounds present. Musculoskeletal: No edema.  No joint effusion. Skin: No rash.  Skin appears warm. Neurologic: Alert awake oriented to time place and person.  Moves all extremities 5 x 5.  No facial asymmetry tongue is midline. Psychiatric: Appears normal.  Normal affect.  Labs on Admission: I have personally reviewed following labs and imaging studies  CBC: Recent Labs  Lab 11/01/17 1434  WBC 12.2*  HGB 17.9*  HCT 50.3  MCV 88.1  PLT 675   Basic Metabolic Panel: Recent Labs  Lab 11/01/17 1434  NA 133*  K 4.3  CL 98*  CO2 23  GLUCOSE 111*  BUN 17  CREATININE 1.26*  CALCIUM 8.7*   GFR: Estimated Creatinine Clearance: 68.2 mL/min (A) (by C-G formula based on SCr of 1.26 mg/dL (H)). Liver Function Tests: No results for input(s): AST, ALT, ALKPHOS, BILITOT, PROT, ALBUMIN in the last 168 hours. No results for input(s): LIPASE, AMYLASE in the last 168 hours. No results for input(s): AMMONIA in the last 168 hours. Coagulation Profile: No results for input(s): INR, PROTIME in the last 168 hours. Cardiac Enzymes: Recent Labs  Lab 11/01/17 1434  TROPONINI <0.03   BNP (last 3 results) No results for input(s): PROBNP in the last 8760 hours. HbA1C: No results for input(s): HGBA1C in the last 72 hours. CBG: No results for input(s): GLUCAP in the last 168 hours. Lipid Profile: No results for input(s): CHOL, HDL, LDLCALC, TRIG, CHOLHDL, LDLDIRECT in the last 72 hours. Thyroid Function Tests: No results for input(s): TSH, T4TOTAL, FREET4, T3FREE, THYROIDAB in the last 72 hours. Anemia Panel: No results for input(s): VITAMINB12, FOLATE, FERRITIN, TIBC, IRON, RETICCTPCT in the last 72 hours. Urine analysis:    Component Value Date/Time   COLORURINE YELLOW 05/22/2011 1454   APPEARANCEUR CLOUDY  (A) 05/22/2011 1454   LABSPEC 1.022 05/22/2011 1454   PHURINE 6.0 05/22/2011 1454   GLUCOSEU NEGATIVE 05/22/2011 1454   HGBUR NEGATIVE 05/22/2011 1454   HGBUR 2+ 09/22/2007 1444   BILIRUBINUR n 11/07/2015 1417   KETONESUR NEGATIVE 05/22/2011 1454   PROTEINUR n 11/07/2015 1417   PROTEINUR NEGATIVE 05/22/2011 1454   UROBILINOGEN 1.0 11/07/2015 1417   UROBILINOGEN 1.0 05/22/2011 1454   NITRITE n 11/07/2015 1417   NITRITE NEGATIVE 05/22/2011 1454   LEUKOCYTESUR Negative 11/07/2015 1417   Sepsis Labs: @LABRCNTIP (procalcitonin:4,lacticidven:4) )No results found for this or any previous visit (from the past 240 hour(s)).   Radiological Exams on Admission: Dg Chest Port 1 View  Result Date: 11/01/2017 CLINICAL DATA:  77 year old male with palpitations, dizziness, tachycardia, shortness of breath. EXAM: PORTABLE CHEST 1 VIEW COMPARISON:  CTA chest 10/20/2016 and earlier. FINDINGS: Portable AP semi upright view at 1511 hours. Mildly lower lung volumes. Stable cardiac size and mediastinal contours. Pacer or resuscitation pads project over the left chest. No pneumothorax, pulmonary edema, pleural effusion or confluent pulmonary opacity. Visualized tracheal air column is within normal limits. Cervical ACDF hardware. No acute osseous abnormality identified. IMPRESSION: Lower lung volumes.  No acute cardiopulmonary abnormality. Electronically Signed   By: Genevie Ann M.D.   On: 11/01/2017 15:30    EKG: Independently reviewed.  Atrial flutter with RVR.  Assessment/Plan Principal Problem:   Atrial flutter with rapid ventricular response (HCC) Active Problems:   ADENOCARCINOMA, PROSTATE   History of renal cell carcinoma   Morbid obesity (HCC)   Essential hypertension   OSA (obstructive sleep apnea)   Hypothyroidism   History of pulmonary embolism    1. Atrial flutter with RVR -was started on Cardizem infusion presently in sinus rhythm.  I have placed patient on Cardizem 120 mg CD following which we  will try to wean off Cardizem infusion.  Check T4 cardiac markers 2D echo.  Since patient has had previous history of provoked pulmonary embolism last  year will check d-dimer.  Since patient's chads 2 vasc score is at least 2 patient is on apixaban.  Cardiology notified.  Since patient is complaining of pain in the left thigh area I have ordered Doppler to rule out DVT. 2. Hypertension on ARB hydrochlorothiazide and presently on Cardizem. 3. History of pituitary tumor status post transsphenoidal resection presently on Synthroid replacement.  Check T4. 4. History of sleep apnea was not able to use CPAP. 5. History of asthma per the chart has not been using any inhalers recently. 6. Morbid obesity.   DVT prophylaxis: Apixaban. Code Status: Full code. Family Communication: [name]. Disposition Plan: Home. Consults called: Cardiology. Admission status: Inpatient.   Rise Patience MD [facility] Pager [phone].  If 7PM-7AM, please contact night-coverage [email or web address] Password [email or web address]  11/01/2017, 8:23 PM

## 2017-11-01 NOTE — Progress Notes (Signed)
Patient c/o pain 10/10 left lateral leg from knee to foot. Pulses +2 PT and DP respectively. Admitting MD notified. Gave verbal order for 1 mg morphine IV. Will administer and monitor VS. Lajoyce Corners, RN

## 2017-11-02 ENCOUNTER — Encounter (HOSPITAL_COMMUNITY): Payer: Self-pay | Admitting: Physician Assistant

## 2017-11-02 ENCOUNTER — Inpatient Hospital Stay (HOSPITAL_COMMUNITY): Payer: Medicare Other

## 2017-11-02 ENCOUNTER — Ambulatory Visit: Payer: Medicare Other | Admitting: Family Medicine

## 2017-11-02 DIAGNOSIS — I4892 Unspecified atrial flutter: Principal | ICD-10-CM

## 2017-11-02 DIAGNOSIS — Z86711 Personal history of pulmonary embolism: Secondary | ICD-10-CM

## 2017-11-02 DIAGNOSIS — M79609 Pain in unspecified limb: Secondary | ICD-10-CM

## 2017-11-02 DIAGNOSIS — I10 Essential (primary) hypertension: Secondary | ICD-10-CM

## 2017-11-02 LAB — BASIC METABOLIC PANEL
Anion gap: 9 (ref 5–15)
BUN: 14 mg/dL (ref 6–20)
CO2: 26 mmol/L (ref 22–32)
CREATININE: 1.29 mg/dL — AB (ref 0.61–1.24)
Calcium: 8.5 mg/dL — ABNORMAL LOW (ref 8.9–10.3)
Chloride: 101 mmol/L (ref 101–111)
GFR, EST NON AFRICAN AMERICAN: 53 mL/min — AB (ref >60)
Glucose, Bld: 95 mg/dL (ref 65–99)
POTASSIUM: 4.2 mmol/L (ref 3.5–5.1)
Sodium: 136 mmol/L (ref 135–145)

## 2017-11-02 LAB — CBC
HEMATOCRIT: 45.5 % (ref 39.0–52.0)
Hemoglobin: 15.6 g/dL (ref 13.0–17.0)
MCH: 31.1 pg (ref 26.0–34.0)
MCHC: 34.3 g/dL (ref 30.0–36.0)
MCV: 90.8 fL (ref 78.0–100.0)
PLATELETS: 284 10*3/uL (ref 150–400)
RBC: 5.01 MIL/uL (ref 4.22–5.81)
RDW: 14.2 % (ref 11.5–15.5)
WBC: 10 10*3/uL (ref 4.0–10.5)

## 2017-11-02 LAB — URINALYSIS, ROUTINE W REFLEX MICROSCOPIC
Bilirubin Urine: NEGATIVE
GLUCOSE, UA: NEGATIVE mg/dL
HGB URINE DIPSTICK: NEGATIVE
Ketones, ur: NEGATIVE mg/dL
Leukocytes, UA: NEGATIVE
Nitrite: NEGATIVE
Protein, ur: NEGATIVE mg/dL
SPECIFIC GRAVITY, URINE: 1.011 (ref 1.005–1.030)
pH: 5 (ref 5.0–8.0)

## 2017-11-02 LAB — ECHOCARDIOGRAM COMPLETE
Height: 71 in
WEIGHTICAEL: 4707.2 [oz_av]

## 2017-11-02 LAB — TROPONIN I

## 2017-11-02 LAB — TSH: TSH: 1.897 u[IU]/mL (ref 0.350–4.500)

## 2017-11-02 MED ORDER — SODIUM CHLORIDE 0.9 % IV SOLN: INTRAVENOUS | Status: DC

## 2017-11-02 MED ORDER — PERFLUTREN LIPID MICROSPHERE
1.0000 mL | INTRAVENOUS | Status: AC | PRN
  Administered 2017-11-02: 3 mL via INTRAVENOUS
  Filled 2017-11-02: qty 10

## 2017-11-02 MED ORDER — TECHNETIUM TO 99M ALBUMIN AGGREGATED
6.0000 | Freq: Once | INTRAVENOUS | Status: AC | PRN
  Administered 2017-11-02: 6 via INTRAVENOUS

## 2017-11-02 MED ORDER — OXYCODONE HCL 5 MG PO TABS
5.0000 mg | ORAL_TABLET | Freq: Four times a day (QID) | ORAL | Status: DC | PRN
  Administered 2017-11-02 – 2017-11-03 (×3): 5 mg via ORAL
  Filled 2017-11-02 (×3): qty 1

## 2017-11-02 MED ORDER — TECHNETIUM TC 99M DIETHYLENETRIAME-PENTAACETIC ACID
30.0000 | Freq: Once | INTRAVENOUS | Status: AC | PRN
  Administered 2017-11-02: 30 via RESPIRATORY_TRACT

## 2017-11-02 MED ORDER — PERFLUTREN LIPID MICROSPHERE
INTRAVENOUS | Status: AC
  Administered 2017-11-02: 3 mL via INTRAVENOUS
  Filled 2017-11-02: qty 10

## 2017-11-02 NOTE — Telephone Encounter (Signed)
Patient was admitted to hospital

## 2017-11-02 NOTE — Progress Notes (Signed)
PROGRESS NOTE    Wayne Booth  GQQ:[id number] DOB: [birth year] DOA: 11/01/2017 PCP: Marin Olp, MD    Brief Narrative: 77 y.o. male with history of pituitary tumor status post transsphenoidal resection on Synthroid, hypertension, sleep apnea history of renal cell carcinoma status post nephrectomy presented to the ER admits in [facility] with complaints of fatigue weakness tachycardia and palpitations.  Patient states over the last 2 weeks patient has been feeling intensely fatigued and his apple watch has been stating that he has been having elevated heart rate.  Patient gets easily short of breath on exertion.  At times he got chest pain and also dizziness.  Did not lose consciousness.  Today patient became very fatigued and decided to come to the ER after speaking to the PCPs office.  Since this morning patient also has been having pain in the left thigh area.  Denies any fall trauma or insect bite.  No rash.  ED Course: In the ER patient was found to be in atrial flutter with RVR and was started on Cardizem infusion and apixaban.  By time I examined the patient patient's rhythm was changed to sinus.  Patient admitted for further management.     Assessment & Plan:   Principal Problem:   Atrial flutter with rapid ventricular response (HCC) Active Problems:   ADENOCARCINOMA, PROSTATE   History of renal cell carcinoma   Morbid obesity (HCC)   Essential hypertension   OSA (obstructive sleep apnea)   Hypothyroidism   History of pulmonary embolism  1. Atrial flutter with RVR -was started on Cardizem infusion presently in sinus rhythm.  I have placed patient on Cardizem 120 mg CD.  Currently off Cardizem IV. TSH is normal cardiac enzymes normal echocardiogram is pending d-dimer is minimally elevated.  VQ scan of the chest has been done results are pending.  Patient has history of PE in the past.  Continue apixaban. 2. Hypertension on ARB hydrochlorothiazide and presently on  Cardizem. 3. History of pituitary tumor status post transsphenoidal resection presently on Synthroid replacement.  Check T4. 4. History of sleep apnea was not able to use CPAP. 5. History of asthma per the chart has not been using any inhalers recently. 6. Morbid obesity.      DVT prophylaxis: Apixaban Code Status: Full code Family Communication: None Disposition Plan: TBD Consultants: [facility]  Procedures: None Antimicrobials: None  Subjective: Reports that he feels better today than yesterday.  Breathing breathing has improved.  Objective: Vitals:   11/01/17 2227 11/01/17 2349 11/02/17 0338 11/02/17 0745  BP: 124/77 118/66 101/65 109/80  Pulse:  87 80 83  Resp:  (!) 22 20 17   Temp:  97.8 F (36.6 C) 97.7 F (36.5 C) 98.9 F (37.2 C)  TempSrc:  Oral Oral Oral  SpO2:  95% 95% 100%  Weight:   133.4 kg (294 lb 3.2 oz)   Height:        Intake/Output Summary (Last 24 hours) at 11/02/2017 1109 Last data filed at 11/02/2017 0730 Gross per 24 hour  Intake 308.75 ml  Output 1651 ml  Net -1342.25 ml   Filed Weights   11/01/17 1425 11/01/17 1832 11/02/17 0338  Weight: 131.5 kg (290 lb) 125.1 kg (275 lb 12.7 oz) 133.4 kg (294 lb 3.2 oz)    Examination:  General exam: Appears calm and comfortable  Respiratory system: Clear to auscultation. Respiratory effort normal. Cardiovascular system: S1 & S2 heard, RRR. No JVD, murmurs, rubs, gallops or clicks. No pedal edema.  Gastrointestinal system: Abdomen is nondistended, soft and nontender. No organomegaly or masses felt. Normal bowel sounds heard. Central nervous system: Alert and oriented. No focal neurological deficits. Extremities: Symmetric 5 x 5 power. Skin: No rashes, lesions or ulcers Psychiatry: Judgement and insight appear normal. Mood & affect appropriate.     Data Reviewed: I have personally reviewed following labs and imaging studies  CBC: Recent Labs  Lab 11/01/17 1434 11/02/17 0204  WBC 12.2* 10.0  HGB  17.9* 15.6  HCT 50.3 45.5  MCV 88.1 90.8  PLT 328 008   Basic Metabolic Panel: Recent Labs  Lab 11/01/17 1434 11/01/17 2038 11/02/17 0204  NA 133*  --  136  K 4.3  --  4.2  CL 98*  --  101  CO2 23  --  26  GLUCOSE 111*  --  95  BUN 17  --  14  CREATININE 1.26*  --  1.29*  CALCIUM 8.7*  --  8.5*  MG  --  1.9  --    GFR: Estimated Creatinine Clearance: 68.9 mL/min (A) (by C-G formula based on SCr of 1.29 mg/dL (H)). Liver Function Tests: Recent Labs  Lab 11/01/17 2038  AST 27  ALT 34  ALKPHOS 69  BILITOT 0.7  PROT 6.9  ALBUMIN 3.3*   No results for input(s): LIPASE, AMYLASE in the last 168 hours. No results for input(s): AMMONIA in the last 168 hours. Coagulation Profile: No results for input(s): INR, PROTIME in the last 168 hours. Cardiac Enzymes: Recent Labs  Lab 11/01/17 1434 11/01/17 2038 11/02/17 0204 11/02/17 0819  TROPONINI <0.03 <0.03 <0.03 <0.03   BNP (last 3 results) No results for input(s): PROBNP in the last 8760 hours. HbA1C: No results for input(s): HGBA1C in the last 72 hours. CBG: No results for input(s): GLUCAP in the last 168 hours. Lipid Profile: No results for input(s): CHOL, HDL, LDLCALC, TRIG, CHOLHDL, LDLDIRECT in the last 72 hours. Thyroid Function Tests: Recent Labs    11/01/17 2038  TSH 1.897   Anemia Panel: No results for input(s): VITAMINB12, FOLATE, FERRITIN, TIBC, IRON, RETICCTPCT in the last 72 hours. Sepsis Labs: No results for input(s): PROCALCITON, LATICACIDVEN in the last 168 hours.  No results found for this or any previous visit (from the past 240 hour(s)).       Radiology Studies: Dg Chest Port 1 View  Result Date: 11/01/2017 CLINICAL DATA:  77 year old male with palpitations, dizziness, tachycardia, shortness of breath. EXAM: PORTABLE CHEST 1 VIEW COMPARISON:  CTA chest 10/20/2016 and earlier. FINDINGS: Portable AP semi upright view at 1511 hours. Mildly lower lung volumes. Stable cardiac size and  mediastinal contours. Pacer or resuscitation pads project over the left chest. No pneumothorax, pulmonary edema, pleural effusion or confluent pulmonary opacity. Visualized tracheal air column is within normal limits. Cervical ACDF hardware. No acute osseous abnormality identified. IMPRESSION: Lower lung volumes.  No acute cardiopulmonary abnormality. Electronically Signed   By: Genevie Ann M.D.   On: 11/01/2017 15:30        Scheduled Meds: . apixaban  5 mg Oral BID  . diltiazem  120 mg Oral Daily  . escitalopram  20 mg Oral Daily  . levothyroxine  150 mcg Oral QAC breakfast  . losartan  50 mg Oral Daily  . mouth rinse  15 mL Mouth Rinse BID  . multivitamin with minerals  1 tablet Oral Daily  . nortriptyline  75 mg Oral QHS  . traZODone  50 mg Oral QHS   Continuous Infusions:  LOS: 1 day     Georgette Shell, MD [facility] If 7PM-7AM, please contact night-coverage [email or web address] Password [email or web address] 11/02/2017, 11:09 AM

## 2017-11-02 NOTE — Consult Note (Addendum)
Cardiology Consultation:   Patient ID: Wayne Booth; [id number]; 1940/12/02   Admit date: 11/01/2017 Date of Consult: 11/02/2017  Primary Care Provider: Marin Olp, MD Primary Cardiologist: Wayne Booth, 10/26/2016 Primary Electrophysiologist:  n/a   Patient Profile:   Wayne Booth is a 77 y.o. male with a hx of HTN, hypothyroid, OSA not on CPAP, pituitary cyst, prostate CA, obesity, nl cors 2008, PE after hand surgery on Xarelto, who is being seen today for the evaluation of atrial flutter at the request of Wayne Wayne Booth.   History of Present Illness:   Wayne Booth [facility] in fall 2017.  Dx PE 09/2016.  No longer on Xarelto.  Wayne Booth has been having problems with fatigue off and on for weeks.  He has been working on his diet and trying to lose some weight.  However, he has not felt like going to the widest swim or doing much of anything else.  He has also had problems with being dizzy and lightheaded at times.  Some orthostatic dizziness, but has also been lightheaded at times.  He has not had palpitations.  He has an Apple watch and watches been alarming in the last few days regarding his heart rate.  However, he was not otherwise aware that his heart rate was elevated and was never aware that it was irregular.  Yesterday morning, he was not feeling well and his wife had to go to work for a few hours so he just stayed in the chair.  When she came back, he was feeling a little better and they went to lunch.  At lunch however, he felt very weak, was dizzy and on his watch, his heart rate was 156.  He came to the emergency room and was in rapid atrial fibrillation.     Past Medical History:  Diagnosis Date  . Allergy    States his nose runs when he is around grease.   Marland Kitchen Anxiety   . Arthritis   . Cancer ([place])   . History of total knee replacement   . Hypertension   . Hypothyroidism   . Kidney cancer, primary, with  metastasis from kidney to other site [facility])   . Kidney stone   . OSA (obstructive sleep apnea)    pt does not wear cpap at night  . Pituitary cyst ([place])   . Pneumonia    hx of  . Prostate cancer [facility])     Past Surgical History:  Procedure Laterality Date  . ANTERIOR CERVICAL DECOMP/DISCECTOMY FUSION N/A 08/29/2012   Procedure: ANTERIOR CERVICAL DECOMPRESSION/DISCECTOMY FUSION 1 LEVEL;  Surgeon: Wayne Moore, MD;  Location: [facility];  Service: Neurosurgery;  Laterality: N/A;  . APPENDECTOMY    . CARDIAC CATHETERIZATION  2008   clean  . CHOLECYSTECTOMY    . COLONOSCOPY W/ POLYPECTOMY    . CRANIOTOMY N/A 11/08/2012   Procedure: CRANIOTOMY HYPOPHYSECTOMY TRANSNASAL APPROACH;  Surgeon: Wayne Ghee, MD;  Location: [facility];  Service: Neurosurgery;  Laterality: N/A;  Transphenoidal Resection of Pituitary Tumor  . INSERTION PROSTATE RADIATION SEED    . KIDNEY STONE SURGERY    . KNEE ARTHROSCOPY  2007   left  . NEPHRECTOMY Left   . POSTERIOR CERVICAL LAMINECTOMY Left 04/24/2015   Procedure: Foraminotomy cervical five - cervical six cervical six - cervical seven left;  Surgeon: Wayne Moore, MD;  Location: [facility];  Service: Neurosurgery;  Laterality: Left;  .  REPLACEMENT TOTAL KNEE BILATERAL       Prior to Admission medications   Medication Sig Start Date End Date Taking? Authorizing Provider  albuterol (PROVENTIL HFA;VENTOLIN HFA) 108 (90 Base) MCG/ACT inhaler Inhale 2 puffs into the lungs every 6 (six) hours as needed for wheezing or shortness of breath. 08/11/17  Yes Wayne Noel, MD  aspirin 325 MG tablet Take 325 mg by mouth daily.   Yes [provider]  budesonide-formoterol (SYMBICORT) 160-4.5 MCG/ACT inhaler Inhale 2 puffs into the lungs 2 (two) times daily. 08/11/17  Yes Wayne Noel, MD  escitalopram (LEXAPRO) 20 MG tablet Take 1 tablet (20 mg total) by mouth daily. TAKE 20 mg every day 08/12/16  Yes Wayne Olp, MD  levothyroxine (SYNTHROID,  LEVOTHROID) 150 MCG tablet TAKE ONE TABLET BY MOUTH DAILY 07/06/17  Yes Wayne Olp, MD  losartan (COZAAR) 50 MG tablet Take 1 tablet (50 mg total) by mouth daily. 02/03/17  Yes Wayne Olp, MD  Multiple Vitamins-Minerals (MULTIVITAMINS THER. W/MINERALS) TABS Take 1 tablet by mouth daily.   Yes [provider]  nortriptyline (PAMELOR) 75 MG capsule TAKE ONE CAPSULE BY MOUTH AT BEDTIME 06/09/17  Yes Wayne Olp, MD  traZODone (DESYREL) 50 MG tablet TAKE ONE HALF TO ONE TABLET BY MOUTH EVERY NIGHT AT BEDTIME AS NEEDED FOR SLEEP 06/09/17  Yes Wayne Olp, MD  clotrimazole-betamethasone (LOTRISONE) cream Apply 1 application topically 2 (two) times daily. Use for 10 days maximum. Patient taking differently: Apply 1 application topically 2 (two) times daily as needed. Use for 10 days maximum. 08/27/16   Wayne Olp, MD  cyclobenzaprine (FLEXERIL) 10 MG tablet Take 10 mg by mouth daily as needed for muscle pain. 09/30/17   [provider]  hydrochlorothiazide (HYDRODIURIL) 25 MG tablet TAKE 1 TABLET (25 MG TOTAL) BY MOUTH EVERY MORNING. 08/12/17   Wayne Olp, MD  traMADol (ULTRAM) 50 MG tablet Take 50 mg by mouth daily as needed for severe pain. 09/30/17   [provider]    Inpatient Medications: Scheduled Meds: . apixaban  5 mg Oral BID  . diltiazem  120 mg Oral Daily  . escitalopram  20 mg Oral Daily  . levothyroxine  150 mcg Oral QAC breakfast  . losartan  50 mg Oral Daily  . mouth rinse  15 mL Mouth Rinse BID  . multivitamin with minerals  1 tablet Oral Daily  . nortriptyline  75 mg Oral QHS  . traZODone  50 mg Oral QHS   Continuous Infusions:  PRN Meds: acetaminophen **OR** acetaminophen, ondansetron **OR** ondansetron (ZOFRAN) IV, oxyCODONE, perflutren lipid microspheres (DEFINITY) IV suspension  Allergies:   No Known Allergies  Social History:   Social History   Socioeconomic History  . Marital status: Married    Spouse name:  Wayne Booth  . Number of children: Y  . Years of education: Not on file  . Highest education level: Not on file  Occupational History  . Occupation: retired Armed forces operational officer: RETIRED  Social Needs  . Financial resource strain: Not on file  . Food insecurity:    Worry: Not on file    Inability: Not on file  . Transportation needs:    Medical: Not on file    Non-medical: Not on file  Tobacco Use  . Smoking status: Former Smoker    Years: 4.00    Types: Cigars  . Smokeless tobacco: Never Used  . Tobacco comment: 1 cigar per  month, with golf, last one was 3-4 months.   Substance and Sexual Activity  . Alcohol use: Yes    Alcohol/week: 1.2 oz    Types: 2 Shots of liquor per week    Comment: socially  . Drug use: No  . Sexual activity: Not on file  Lifestyle  . Physical activity:    Days per week: Not on file    Minutes per session: Not on file  . Stress: Not on file  Relationships  . Social connections:    Talks on phone: Not on file    Gets together: Not on file    Attends religious service: Not on file    Active member of club or organization: Not on file    Attends meetings of clubs or organizations: Not on file    Relationship status: Not on file  . Intimate partner violence:    Fear of current or ex partner: Not on file    Emotionally abused: Not on file    Physically abused: Not on file    Forced sexual activity: Not on file  Other Topics Concern  . Not on file  Social History Narrative   Married 1965 (wife patient of Wayne. Megan Salon). 2 sons. 4 granddaughters.       Retired Chemical engineer.       Hobbies: golf, fishing, formerly hunting.     Family History:   Family History  Problem Relation Age of Onset  . Cancer Father        ?mouth, died before patient born  . Cancer Mother        stomach, died when he was young  . Kidney cancer Sister   . Colon cancer Brother 54  . Esophageal cancer Neg Hx   . Rectal cancer Neg Hx   .  Stomach cancer Neg Hx    Family Status:  Family Status  Relation Name Status  . Father  Deceased  . Mother  Deceased  . Sister  (Not Specified)  . Brother  (Not Specified)  . MGM  Deceased  . MGF  Deceased  . PGM  Deceased  . PGF  Deceased  . Neg Hx  (Not Specified)    ROS:  Please see the history of present illness.  All other ROS reviewed and negative.     Physical Exam/Data:   Vitals:   11/01/17 2349 11/02/17 0338 11/02/17 0745 11/02/17 1500  BP: 118/66 101/65 109/80   Pulse: 87 80 83 75  Resp: (!) 22 20 17 18   Temp: 97.8 F (36.6 C) 97.7 F (36.5 C) 98.9 F (37.2 C)   TempSrc: Oral Oral Oral   SpO2: 95% 95% 100% 99%  Weight:  294 lb 3.2 oz (133.4 kg)    Height:        Intake/Output Summary (Last 24 hours) at 11/02/2017 1615 Last data filed at 11/02/2017 0730 Gross per 24 hour  Intake 308.75 ml  Output 1651 ml  Net -1342.25 ml   Filed Weights   11/01/17 1425 11/01/17 1832 11/02/17 0338  Weight: 290 lb (131.5 kg) 275 lb 12.7 oz (125.1 kg) 294 lb 3.2 oz (133.4 kg)   Body mass index is 41.03 kg/m.  General:  Well nourished, well developed, in no acute distress HEENT: normal Lymph: no adenopathy Neck: no JVD seen, difficult to assess secondary to body habitus Endocrine:  No thryomegaly Vascular: No carotid bruits; 4/4 extremity pulses 2+, without bruits  Cardiac:  normal S1, S2; RRR; no murmur  Lungs: Rales bases bilaterally, no wheezing, rhonchi   Abd: soft, nontender, no hepatomegaly  Ext: no edema Musculoskeletal:  No deformities, BUE and BLE strength normal and equal Skin: warm and dry  Neuro:  CNs 2-12 intact, no focal abnormalities noted Psych:  Normal affect   EKG:  The EKG was personally reviewed and demonstrates:  Atrial flutter, HR 152 Telemetry:  Telemetry was personally reviewed and demonstrates:  Sinus rhythm since last pm.  Relevant CV Studies:  ECHO: 11/02/2017 - Left ventricle: The cavity size was normal. Wall thickness was    increased in a pattern of mild LVH. Systolic function was normal.   The estimated ejection fraction was in the range of 50% to 55%.   Wall motion was normal; there were no regional wall motion   abnormalities. Features are consistent with a pseudonormal left   ventricular filling pattern, with concomitant abnormal relaxation   and increased filling pressure (grade 2 diastolic dysfunction). - Aortic valve: Noncoronary cusp mobility was moderately   restricted. - Mitral valve: Calcified annulus. Impressions: - Technically difficult; definity used; normal LV systolic   function; mild LVH; moderate diastolic dysfunction.  Laboratory Data:  Chemistry Recent Labs  Lab 11/01/17 1434 11/02/17 0204  NA 133* 136  K 4.3 4.2  CL 98* 101  CO2 23 26  GLUCOSE 111* 95  BUN 17 14  CREATININE 1.26* 1.29*  CALCIUM 8.7* 8.5*  GFRNONAA 54* 53*  GFRAA >60 >60  ANIONGAP 12 9    Lab Results  Component Value Date   ALT 34 11/01/2017   AST 27 11/01/2017   ALKPHOS 69 11/01/2017   BILITOT 0.7 11/01/2017   Hematology Recent Labs  Lab 11/01/17 1434 11/02/17 0204  WBC 12.2* 10.0  RBC 5.71 5.01  HGB 17.9* 15.6  HCT 50.3 45.5  MCV 88.1 90.8  MCH 31.3 31.1  MCHC 35.6 34.3  RDW 14.2 14.2  PLT 328 284   Cardiac Enzymes Recent Labs  Lab 11/01/17 1434 11/01/17 2038 11/02/17 0204 11/02/17 0819  TROPONINI <0.03 <0.03 <0.03 <0.03     BNPNo results for input(s): BNP, PROBNP in the last 168 hours.  DDimer  Recent Labs  Lab 11/01/17 2038  DDIMER 0.52*   TSH:  Lab Results  Component Value Date   TSH 1.897 11/01/2017   Lipids: Lab Results  Component Value Date   CHOL 141 04/12/2017   HDL 38.20 (L) 04/12/2017   LDLCALC 69 04/12/2017   LDLDIRECT 62.0 09/07/2006   TRIG 168.0 (H) 04/12/2017   CHOLHDL 4 04/12/2017   HgbA1c: Lab Results  Component Value Date   HGBA1C 6.1 04/12/2017   Magnesium:  Magnesium  Date Value Ref Range Status  11/01/2017 1.9 1.7 - 2.4 mg/dL Final     Comment:    Performed at [facility], [place] [address] [place], [place] [address]     Radiology/Studies:  Nm Pulmonary Perf And Vent  Result Date: 11/02/2017 CLINICAL DATA:  Shortness of breath, tachycardia, history of blood clots, LEFT lower leg pain, elevated D-dimer, question pulmonary embolism EXAM: NUCLEAR MEDICINE VENTILATION - PERFUSION LUNG SCAN TECHNIQUE: Ventilation images were obtained in multiple projections using inhaled aerosol Tc-33m DTPA. Perfusion images were obtained in multiple projections after intravenous injection of Tc-35m-MAA. RADIOPHARMACEUTICALS:  30 mCi of Tc-36m DTPA aerosol inhalation and 6 mCi Tc5m-MAA IV COMPARISON:  None Correlation: Chest radiograph 11/01/2017 FINDINGS: Ventilation: Central airway deposition of aerosol. Enlargement of cardiac silhouette. No focal ventilation defects. Perfusion: Mild enlargement of cardiac silhouette. Fusion  otherwise normal. No segmental or subsegmental perfusion defects. Chest radiograph: Mild enlargement of cardiac silhouette. IMPRESSION: Cardiomegaly. Very low probability for pulmonary embolism. Electronically Signed   By: Lavonia Dana M.D.   On: 11/02/2017 12:40   Dg Chest Port 1 View  Result Date: 11/01/2017 CLINICAL DATA:  77 year old male with palpitations, dizziness, tachycardia, shortness of breath. EXAM: PORTABLE CHEST 1 VIEW COMPARISON:  CTA chest 10/20/2016 and earlier. FINDINGS: Portable AP semi upright view at 1511 hours. Mildly lower lung volumes. Stable cardiac size and mediastinal contours. Pacer or resuscitation pads project over the left chest. No pneumothorax, pulmonary edema, pleural effusion or confluent pulmonary opacity. Visualized tracheal air column is within normal limits. Cervical ACDF hardware. No acute osseous abnormality identified. IMPRESSION: Lower lung volumes.  No acute cardiopulmonary abnormality. Electronically Signed   By: Genevie Ann M.D.   On: 11/01/2017 15:30    Assessment and Plan:    Principal Problem: 1.  Atrial flutter with rapid ventricular response (HCC) - spontaneous conversion to SR last pm - CHA2DS2-VASc = 5 (age x 2, HTN, CHF, prior PE) - Suspect he has been going in/out of this for a while, based on sx - restart Xarelto  2. Chronic diastolic CHF - has not been weighing daily at home. - trying to eat better. - baseline wt 290-300 lbs - home HCTZ 25 mg is on hold - MD advise on starting HCTZ back or giving Lasix   Otherwise, per IM Active Problems:   ADENOCARCINOMA, PROSTATE   History of renal cell carcinoma   Morbid obesity (HCC)   Essential hypertension   OSA (obstructive sleep apnea)   Hypothyroidism   History of pulmonary embolism  For questions or updates, please contact [facility] Please consult [email or web address] for contact info under Cardiology/STEMI.   Signed, Rosaria Ferries, PA-C  11/02/2017 4:15 PM   I have examined the patient and reviewed assessment and plan and discussed with patient.  Agree with above as stated.  Patient with new onset atrial flutter.  Chads vas score of 5.  Start Xarelto 20 mg daily.  He is interested in trying to lose weight.  Consider nutrition consult for morbid obesity.  Stop aspirin once Xarelto is started.  The rationale for anticoagulation was explained to the patient and his wife.  All questions were answered.  No bleeding problems in the past.  Continue diltiazem for rate control.  He has converted back to normal sinus rhythm.  Will follow.  May be ready for discharge tomorrow from a cardiac standpoint.  Larae Grooms

## 2017-11-02 NOTE — Progress Notes (Signed)
  Echocardiogram 2D Echocardiogram with definity has been performed.  Darlina Sicilian M 11/02/2017, 3:06 PM

## 2017-11-02 NOTE — Progress Notes (Signed)
Bilateral lower extremity venous duplex completed. Preliminary results. There is no evidence of DVT, superficial thrombosis, or Baker's cyst. Toma Copier, RVS 11/02/2017 4:24 Pm

## 2017-11-03 DIAGNOSIS — Z7901 Long term (current) use of anticoagulants: Secondary | ICD-10-CM

## 2017-11-03 MED ORDER — APIXABAN 5 MG PO TABS: 5.0000 mg | ORAL_TABLET | Freq: Two times a day (BID) | ORAL | 1 refills | Status: DC

## 2017-11-03 MED ORDER — DILTIAZEM HCL ER COATED BEADS 120 MG PO CP24: 120.0000 mg | ORAL_CAPSULE | Freq: Every day | ORAL | 0 refills | Status: DC

## 2017-11-03 NOTE — Discharge Instructions (Signed)
Information on my medicine - ELIQUIS® (apixaban) ° °This medication education was reviewed with me or my healthcare representative as part of my discharge preparation.  The pharmacist that spoke with me during my hospital stay was:  Annalee Meyerhoff Kay, RPH ° °Why was Eliquis® prescribed for you? °Eliquis® was prescribed for you to reduce the risk of forming blood clots that can cause a stroke if you have a medical condition called atrial fibrillation (a type of irregular heartbeat) OR to reduce the risk of a blood clots forming after orthopedic surgery. ° °What do You need to know about Eliquis® ? °Take your Eliquis® TWICE DAILY - one tablet in the morning and one tablet in the evening with or without food.  It would be best to take the doses about the same time each day. ° °If you have difficulty swallowing the tablet whole please discuss with your pharmacist how to take the medication safely. ° °Take Eliquis® exactly as prescribed by your doctor and DO NOT stop taking Eliquis® without talking to the doctor who prescribed the medication.  Stopping may increase your risk of developing a new clot or stroke.  Refill your prescription before you run out. ° °After discharge, you should have regular check-up appointments with your healthcare provider that is prescribing your Eliquis®.  In the future your dose may need to be changed if your kidney function or weight changes by a significant amount or as you get older. ° °What do you do if you miss a dose? °If you miss a dose, take it as soon as you remember on the same day and resume taking twice daily.  Do not take more than one dose of ELIQUIS at the same time. ° °Important Safety Information °A possible side effect of Eliquis® is bleeding. You should call your healthcare provider right away if you experience any of the following: °? Bleeding from an injury or your nose that does not stop. °? Unusual colored urine (red or dark brown) or unusual colored stools (red or  black). °? Unusual bruising for unknown reasons. °? A serious fall or if you hit your head (even if there is no bleeding). ° °Some medicines may interact with Eliquis® and might increase your risk of bleeding or clotting while on Eliquis®. To help avoid this, consult your healthcare provider or pharmacist prior to using any new prescription or non-prescription medications, including herbals, vitamins, non-steroidal anti-inflammatory drugs (NSAIDs) and supplements. ° °This website has more information on Eliquis® (apixaban): www.Eliquis.com. ° ° °

## 2017-11-03 NOTE — Evaluation (Signed)
Physical Therapy Evaluation and Discharge Patient Details Name: Wayne Booth MRN: [id number] DOB: 06-12-1941 Today's Date: 11/03/2017   History of Present Illness  Pt is a 77 y/o male admitted secondary to weakness, SOB, fatigue, and palpitations. Found to have a flutter with RVR. PMH includes obesity, HTN, OSA, PE, renal cell carcinoma s/p nephrectomy.   Clinical Impression  Patient evaluated by Physical Therapy with no further acute PT needs identified. All education has been completed and the patient has no further questions. Pt overall steady with gait requiring supervision for safety. No LOB noted during mobility tasks. Distance limited secondary to SOB, however, oxygen sats at 94% on RA. Discussed energy conservation techniques, and equipment recommendations, however, pt refusing equipment at this time. See below for any follow-up Physical Therapy or equipment needs. PT is signing off. Thank you for this referral. Please re-consult if needs change.      Follow Up Recommendations No PT follow up;Supervision - Intermittent    Equipment Recommendations  None recommended by PT(pt refusing equipment recommendations )    Recommendations for Other Services       Precautions / Restrictions Precautions Precautions: None Restrictions Weight Bearing Restrictions: No      Mobility  Bed Mobility               General bed mobility comments: Sitting at EOB upon entry.   Transfers Overall transfer level: Needs assistance Equipment used: None Transfers: Sit to/from Stand Sit to Stand: Supervision         General transfer comment: Supervision for safety.   Ambulation/Gait Ambulation/Gait assistance: Supervision Ambulation Distance (Feet): 125 Feet Assistive device: None Gait Pattern/deviations: Step-through pattern;Decreased stride length Gait velocity: Decreased  Gait velocity interpretation: 1.31 - 2.62 ft/sec, indicative of limited community ambulator General Gait  Details: Slow, guarded gait. Overall steady requiring supervision for safety. Pt with increased SOB which limited further ambulation distance. Oxygen sats at 94% on RA.   Stairs            Wheelchair Mobility    Modified Rankin (Stroke Patients Only)       Balance Overall balance assessment: Needs assistance Sitting-balance support: No upper extremity supported;Feet supported Sitting balance-Leahy Scale: Good     Standing balance support: No upper extremity supported;During functional activity Standing balance-Leahy Scale: Good                               Pertinent Vitals/Pain Pain Assessment: No/denies pain    Home Living Family/patient expects to be discharged to:: Private residence Living Arrangements: Spouse/significant other Available Help at Discharge: Family;Available 24 hours/day Type of Home: House Home Access: Level entry     Home Layout: Two level;Able to live on main level with bedroom/bathroom Home Equipment: Kasandra Knudsen - single point      Prior Function Level of Independence: Independent               Hand Dominance        Extremity/Trunk Assessment   Upper Extremity Assessment Upper Extremity Assessment: Overall WFL for tasks assessed    Lower Extremity Assessment Lower Extremity Assessment: Overall WFL for tasks assessed    Cervical / Trunk Assessment Cervical / Trunk Assessment: Normal  Communication   Communication: No difficulties  Cognition Arousal/Alertness: Awake/alert Behavior During Therapy: WFL for tasks assessed/performed Overall Cognitive Status: Within Functional Limits for tasks assessed  General Comments General comments (skin integrity, edema, etc.): Reviewed energy conservation techniques with pt. Reviewed generalized walking program with pt. Educated about use of rollator and shower chair, however, pt refusing at this time.     Exercises      Assessment/Plan    PT Assessment Patent does not need any further PT services  PT Problem List         PT Treatment Interventions      PT Goals (Current goals can be found in the Care Plan section)  Acute Rehab PT Goals Patient Stated Goal: to go home  PT Goal Formulation: With patient Time For Goal Achievement: 11/03/17 Potential to Achieve Goals: Good    Frequency     Barriers to discharge        Co-evaluation               AM-PAC PT "6 Clicks" Daily Activity  Outcome Measure Difficulty turning over in bed (including adjusting bedclothes, sheets and blankets)?: None Difficulty moving from lying on back to sitting on the side of the bed? : None Difficulty sitting down on and standing up from a chair with arms (e.g., wheelchair, bedside commode, etc,.)?: None Help needed moving to and from a bed to chair (including a wheelchair)?: None Help needed walking in hospital room?: None Help needed climbing 3-5 steps with a railing? : A Little 6 Click Score: 23    End of Session Equipment Utilized During Treatment: Gait belt Activity Tolerance: Patient limited by fatigue Patient left: in bed;with call bell/phone within reach;with family/visitor present Nurse Communication: Mobility status PT Visit Diagnosis: Other abnormalities of gait and mobility (R26.89)    Time: 3491-7915 PT Time Calculation (min) (ACUTE ONLY): 16 min   Charges:   PT Evaluation $PT Eval Low Complexity: 1 Low     PT G Codes:        Leighton Ruff, PT, DPT  Acute Rehabilitation Services  Pager: [phone]   Rudean Hitt 11/03/2017, 5:24 PM

## 2017-11-03 NOTE — Progress Notes (Signed)
Discharge instructions reviewed with patient and his wife. Patient in stable condition and denies questions.Patient has all belongings and agrees to F/U appointments and instructions. Left by wheelchair with nurse Ander Purpura to meet wife at family vehicle.

## 2017-11-03 NOTE — Discharge Summary (Signed)
Physician Discharge Summary  Wayne Booth PXT:[id number] DOB: 02-17-1941 DOA: 11/01/2017  PCP: Marin Olp, MD  Admit date: 11/01/2017 Discharge date: 11/03/2017  Admitted From:home Disposition:home Recommendations for Outpatient Follow-up:  1. Follow up with PCP in 1-2 weeks 2. Please obtain BMP/CBC in one week  Home Health:none Equipment/Devicesnone Discharge Conditionstable CODE STATUS full Diet recommendation:cardiac  Brief/Interim Summary:76 y.o.malewithhistory of pituitary tumor status post transsphenoidal resection on Synthroid, hypertension, sleep apnea history of renal cell carcinoma status post nephrectomy presented to the ER admits in [facility] with complaints of fatigue weakness tachycardia and palpitations. Patient states over the last 2 weeks patient has been feeling intensely fatigued and his apple watch has been stating that he has been having elevated heart rate. Patient gets easily short of breath on exertion. At times he got chest pain and also dizziness. Did not lose consciousness. Today patient became very fatigued and decided to come to the ER after speaking to the PCPs office.Since this morning patient also has been having pain in the left thigh area. Denies any fall trauma or insect bite. No rash.  ED Course:In the ER patient was found to be in atrial flutter with RVR and was started on Cardizem infusion and apixaban. By time I examined the patient patient's rhythm was changed to sinus. Patient admitted for further management.    Discharge Diagnoses:  Principal Problem:   Atrial flutter with rapid ventricular response (HCC) Active Problems:   ADENOCARCINOMA, PROSTATE   History of renal cell carcinoma   Morbid obesity (HCC)   Essential hypertension   OSA (obstructive sleep apnea)   Hypothyroidism   History of pulmonary embolism   Anticoagulated 1. Atrial flutter with RVR-was started on Cardizem infusion presently in sinus rhythm. I  have placed patient on Cardizem 120 mg CD. TSH is normal cardiac enzymes normal echocardiogram is pending d-dimer is minimally elevated. VQ scan of the chest low probability for PE. Continue apixaban.  Patient is already on apixaban twice a day.  2. Hypertension on ARB and presently on Cardizem.hctz has been on hold through out the hospital stay. 3. History of pituitary tumor status post transsphenoidal resection presently on Synthroid replacement. History of sleep apnea was not able to use CPAP. 4. History of asthma per the chart has not been using any inhalers recently. 5. Morbid obesity.      Discharge Instructions   Allergies as of 11/03/2017   No Known Allergies     Medication List    STOP taking these medications   hydrochlorothiazide 25 MG tablet Commonly known as:  HYDRODIURIL     TAKE these medications   albuterol 108 (90 Base) MCG/ACT inhaler Commonly known as:  PROVENTIL HFA;VENTOLIN HFA Inhale 2 puffs into the lungs every 6 (six) hours as needed for wheezing or shortness of breath.   apixaban 5 MG Tabs tablet Commonly known as:  ELIQUIS Take 1 tablet (5 mg total) by mouth 2 (two) times daily.   aspirin 325 MG tablet Take 325 mg by mouth daily.   budesonide-formoterol 160-4.5 MCG/ACT inhaler Commonly known as:  SYMBICORT Inhale 2 puffs into the lungs 2 (two) times daily. What changed:    when to take this  reasons to take this   clotrimazole-betamethasone cream Commonly known as:  LOTRISONE Apply 1 application topically 2 (two) times daily. Use for 10 days maximum.   cyclobenzaprine 10 MG tablet Commonly known as:  FLEXERIL Take 10 mg by mouth daily as needed for muscle pain.   diltiazem  120 MG 24 hr capsule Commonly known as:  CARDIZEM CD Take 1 capsule (120 mg total) by mouth daily. Start taking on:  11/04/2017   escitalopram 20 MG tablet Commonly known as:  LEXAPRO Take 1 tablet (20 mg total) by mouth daily. TAKE 20 mg every day    levothyroxine 150 MCG tablet Commonly known as:  SYNTHROID, LEVOTHROID TAKE ONE TABLET BY MOUTH DAILY   losartan 50 MG tablet Commonly known as:  COZAAR Take 1 tablet (50 mg total) by mouth daily.   multivitamins ther. w/minerals Tabs tablet Take 1 tablet by mouth daily.   nortriptyline 75 MG capsule Commonly known as:  PAMELOR TAKE ONE CAPSULE BY MOUTH AT BEDTIME   traMADol 50 MG tablet Commonly known as:  ULTRAM Take 50 mg by mouth daily as needed for severe pain.   traZODone 50 MG tablet Commonly known as:  DESYREL TAKE ONE HALF TO ONE TABLET BY MOUTH EVERY NIGHT AT BEDTIME AS NEEDED FOR SLEEP      Follow-up Information    Marin Olp, MD Follow up.   Specialty:  Family Medicine Contact information: [place] [address] [phone]        Jettie Booze, MD Follow up.   Specialties:  Cardiology, Radiology, Interventional Cardiology Contact information: [address] [place] [address] [phone]          No Known Allergies  Consultations: chmg  Procedures/Studies: Nm Pulmonary Perf And Vent  Result Date: 11/02/2017 CLINICAL DATA:  Shortness of breath, tachycardia, history of blood clots, LEFT lower leg pain, elevated D-dimer, question pulmonary embolism EXAM: NUCLEAR MEDICINE VENTILATION - PERFUSION LUNG SCAN TECHNIQUE: Ventilation images were obtained in multiple projections using inhaled aerosol Tc-86m DTPA. Perfusion images were obtained in multiple projections after intravenous injection of Tc-3m-MAA. RADIOPHARMACEUTICALS:  30 mCi of Tc-15m DTPA aerosol inhalation and 6 mCi Tc85m-MAA IV COMPARISON:  None Correlation: Chest radiograph 11/01/2017 FINDINGS: Ventilation: Central airway deposition of aerosol. Enlargement of cardiac silhouette. No focal ventilation defects. Perfusion: Mild enlargement of cardiac silhouette. Fusion otherwise normal. No segmental or subsegmental perfusion defects. Chest  radiograph: Mild enlargement of cardiac silhouette. IMPRESSION: Cardiomegaly. Very low probability for pulmonary embolism. Electronically Signed   By: Lavonia Dana M.D.   On: 11/02/2017 12:40   Dg Chest Port 1 View  Result Date: 11/01/2017 CLINICAL DATA:  77 year old male with palpitations, dizziness, tachycardia, shortness of breath. EXAM: PORTABLE CHEST 1 VIEW COMPARISON:  CTA chest 10/20/2016 and earlier. FINDINGS: Portable AP semi upright view at 1511 hours. Mildly lower lung volumes. Stable cardiac size and mediastinal contours. Pacer or resuscitation pads project over the left chest. No pneumothorax, pulmonary edema, pleural effusion or confluent pulmonary opacity. Visualized tracheal air column is within normal limits. Cervical ACDF hardware. No acute osseous abnormality identified. IMPRESSION: Lower lung volumes.  No acute cardiopulmonary abnormality. Electronically Signed   By: Genevie Ann M.D.   On: 11/01/2017 15:30    (Echo, Carotid, EGD, Colonoscopy, ERCP)    Subjective:   Discharge Exam: Vitals:   11/03/17 0901 11/03/17 1145  BP: 112/74 130/69  Pulse: 87 75  Resp: 19 14  Temp: 98.4 F (36.9 C)   SpO2: 97% 93%   Vitals:   11/03/17 0000 11/03/17 0414 11/03/17 0901 11/03/17 1145  BP: 117/78 103/69 112/74 130/69  Pulse: 80 81 87 75  Resp: 17 15 19 14   Temp: 97.7 F (36.5 C) 97.8 F (36.6 C) 98.4 F (36.9 C)   TempSrc:  Oral Oral Oral   SpO2: 95% 96% 97% 93%  Weight:  133.5 kg (294 lb 4.8 oz)    Height:        General: Pt is alert, awake, not in acute distress Cardiovascular: RRR, S1/S2 +, no rubs, no gallops Respiratory: CTA bilaterally, no wheezing, no rhonchi Abdominal: Soft, NT, ND, bowel sounds + Extremities: no edema, no cyanosis    The results of significant diagnostics from this hospitalization (including imaging, microbiology, ancillary and laboratory) are listed below for reference.     Microbiology: No results found for this or any previous visit (from  the past 240 hour(s)).   Labs: BNP (last 3 results) No results for input(s): BNP in the last 8760 hours. Basic Metabolic Panel: Recent Labs  Lab 11/01/17 1434 11/01/17 2038 11/02/17 0204  NA 133*  --  136  K 4.3  --  4.2  CL 98*  --  101  CO2 23  --  26  GLUCOSE 111*  --  95  BUN 17  --  14  CREATININE 1.26*  --  1.29*  CALCIUM 8.7*  --  8.5*  MG  --  1.9  --    Liver Function Tests: Recent Labs  Lab 11/01/17 2038  AST 27  ALT 34  ALKPHOS 69  BILITOT 0.7  PROT 6.9  ALBUMIN 3.3*   No results for input(s): LIPASE, AMYLASE in the last 168 hours. No results for input(s): AMMONIA in the last 168 hours. CBC: Recent Labs  Lab 11/01/17 1434 11/02/17 0204  WBC 12.2* 10.0  HGB 17.9* 15.6  HCT 50.3 45.5  MCV 88.1 90.8  PLT 328 284   Cardiac Enzymes: Recent Labs  Lab 11/01/17 1434 11/01/17 2038 11/02/17 0204 11/02/17 0819  TROPONINI <0.03 <0.03 <0.03 <0.03   BNP: Invalid input(s): POCBNP CBG: No results for input(s): GLUCAP in the last 168 hours. D-Dimer Recent Labs    11/01/17 2038  DDIMER 0.52*   Hgb A1c No results for input(s): HGBA1C in the last 72 hours. Lipid Profile No results for input(s): CHOL, HDL, LDLCALC, TRIG, CHOLHDL, LDLDIRECT in the last 72 hours. Thyroid function studies Recent Labs    11/01/17 2038  TSH 1.897   Anemia work up No results for input(s): VITAMINB12, FOLATE, FERRITIN, TIBC, IRON, RETICCTPCT in the last 72 hours. Urinalysis    Component Value Date/Time   COLORURINE YELLOW 11/02/2017 0347   APPEARANCEUR CLEAR 11/02/2017 0347   LABSPEC 1.011 11/02/2017 0347   PHURINE 5.0 11/02/2017 0347   GLUCOSEU NEGATIVE 11/02/2017 0347   HGBUR NEGATIVE 11/02/2017 0347   HGBUR 2+ 09/22/2007 1444   BILIRUBINUR NEGATIVE 11/02/2017 0347   BILIRUBINUR n 11/07/2015 1417   KETONESUR NEGATIVE 11/02/2017 0347   PROTEINUR NEGATIVE 11/02/2017 0347   UROBILINOGEN 1.0 11/07/2015 1417   UROBILINOGEN 1.0 05/22/2011 1454   NITRITE NEGATIVE  11/02/2017 0347   LEUKOCYTESUR NEGATIVE 11/02/2017 0347   Sepsis Labs Invalid input(s): PROCALCITONIN,  WBC,  LACTICIDVEN Microbiology No results found for this or any previous visit (from the past 240 hour(s)).   Time coordinating discharge: Over 30 minutes  SIGNED:   Georgette Shell, MD  [facility] 11/03/2017, 4:29 PM Pager   If 7PM-7AM, please contact night-coverage [email or web address] Password [email or web address]

## 2017-11-03 NOTE — Progress Notes (Signed)
Patient ambulated 450 feet in hall without assistive device and standby assist only. He became minimally dyspneic with sats 88-89% on room air upon returning with approx one minute of recovery to 95% SpO2. He states he is much less short of breath that at home prior to hospital admission.

## 2017-11-03 NOTE — Progress Notes (Addendum)
Progress Note  Patient Name: Wayne Booth Date of Encounter: 11/03/2017  Primary Cardiologist: No primary care provider on file.  Subjective   Feeling well this morning.   Inpatient Medications    Scheduled Meds: . apixaban  5 mg Oral BID  . diltiazem  120 mg Oral Daily  . escitalopram  20 mg Oral Daily  . levothyroxine  150 mcg Oral QAC breakfast  . losartan  50 mg Oral Daily  . mouth rinse  15 mL Mouth Rinse BID  . multivitamin with minerals  1 tablet Oral Daily  . nortriptyline  75 mg Oral QHS  . traZODone  50 mg Oral QHS   Continuous Infusions:  PRN Meds: acetaminophen **OR** acetaminophen, ondansetron **OR** ondansetron (ZOFRAN) IV, oxyCODONE   Vital Signs    Vitals:   11/02/17 2300 11/03/17 0000 11/03/17 0414 11/03/17 0901  BP:  117/78 103/69 112/74  Pulse:  80 81 87  Resp: 12 17 15 19   Temp:  97.7 F (36.5 C) 97.8 F (36.6 C) 98.4 F (36.9 C)  TempSrc:  Oral Oral Oral  SpO2: 94% 95% 96% 97%  Weight:   294 lb 4.8 oz (133.5 kg)   Height:        Intake/Output Summary (Last 24 hours) at 11/03/2017 1135 Last data filed at 11/03/2017 0000 Gross per 24 hour  Intake 960 ml  Output -  Net 960 ml   Filed Weights   11/01/17 1832 11/02/17 0338 11/03/17 0414  Weight: 275 lb 12.7 oz (125.1 kg) 294 lb 3.2 oz (133.4 kg) 294 lb 4.8 oz (133.5 kg)    Telemetry    SR - Personally Reviewed  ECG    N/a - Personally Reviewed  Physical Exam   General: Obese older W male appearing in no acute distress. Head: Normocephalic, atraumatic.  Neck: Supple, difficult to assess JVD. Lungs:  Resp regular and unlabored, CTA. Heart: RRR, S1, S2, no S3, S4, or murmur; no rub. Abdomen: Soft, non-tender, non-distended with normoactive bowel sounds.  Extremities: No clubbing, cyanosis, edema. Distal pedal pulses are 2+ bilaterally. Neuro: Alert and oriented X 3. Moves all extremities spontaneously. Psych: Normal affect.  Labs    Chemistry Recent Labs  Lab  11/01/17 1434 11/01/17 2038 11/02/17 0204  NA 133*  --  136  K 4.3  --  4.2  CL 98*  --  101  CO2 23  --  26  GLUCOSE 111*  --  95  BUN 17  --  14  CREATININE 1.26*  --  1.29*  CALCIUM 8.7*  --  8.5*  PROT  --  6.9  --   ALBUMIN  --  3.3*  --   AST  --  27  --   ALT  --  34  --   ALKPHOS  --  69  --   BILITOT  --  0.7  --   GFRNONAA 54*  --  53*  GFRAA >60  --  >60  ANIONGAP 12  --  9     Hematology Recent Labs  Lab 11/01/17 1434 11/02/17 0204  WBC 12.2* 10.0  RBC 5.71 5.01  HGB 17.9* 15.6  HCT 50.3 45.5  MCV 88.1 90.8  MCH 31.3 31.1  MCHC 35.6 34.3  RDW 14.2 14.2  PLT 328 284    Cardiac Enzymes Recent Labs  Lab 11/01/17 1434 11/01/17 2038 11/02/17 0204 11/02/17 0819  TROPONINI <0.03 <0.03 <0.03 <0.03   No results for input(s): TROPIPOC in the  last 168 hours.   BNPNo results for input(s): BNP, PROBNP in the last 168 hours.   DDimer  Recent Labs  Lab 11/01/17 2038  DDIMER 0.52*      Radiology    Nm Pulmonary Perf And Vent  Result Date: 11/02/2017 CLINICAL DATA:  Shortness of breath, tachycardia, history of blood clots, LEFT lower leg pain, elevated D-dimer, question pulmonary embolism EXAM: NUCLEAR MEDICINE VENTILATION - PERFUSION LUNG SCAN TECHNIQUE: Ventilation images were obtained in multiple projections using inhaled aerosol Tc-85m DTPA. Perfusion images were obtained in multiple projections after intravenous injection of Tc-49m-MAA. RADIOPHARMACEUTICALS:  30 mCi of Tc-62m DTPA aerosol inhalation and 6 mCi Tc92m-MAA IV COMPARISON:  None Correlation: Chest radiograph 11/01/2017 FINDINGS: Ventilation: Central airway deposition of aerosol. Enlargement of cardiac silhouette. No focal ventilation defects. Perfusion: Mild enlargement of cardiac silhouette. Fusion otherwise normal. No segmental or subsegmental perfusion defects. Chest radiograph: Mild enlargement of cardiac silhouette. IMPRESSION: Cardiomegaly. Very low probability for pulmonary embolism.  Electronically Signed   By: Lavonia Dana M.D.   On: 11/02/2017 12:40   Dg Chest Port 1 View  Result Date: 11/01/2017 CLINICAL DATA:  77 year old male with palpitations, dizziness, tachycardia, shortness of breath. EXAM: PORTABLE CHEST 1 VIEW COMPARISON:  CTA chest 10/20/2016 and earlier. FINDINGS: Portable AP semi upright view at 1511 hours. Mildly lower lung volumes. Stable cardiac size and mediastinal contours. Pacer or resuscitation pads project over the left chest. No pneumothorax, pulmonary edema, pleural effusion or confluent pulmonary opacity. Visualized tracheal air column is within normal limits. Cervical ACDF hardware. No acute osseous abnormality identified. IMPRESSION: Lower lung volumes.  No acute cardiopulmonary abnormality. Electronically Signed   By: Genevie Ann M.D.   On: 11/01/2017 15:30    Cardiac Studies   TTE: 11/02/17  Study Conclusions  - Left ventricle: The cavity size was normal. Wall thickness was   increased in a pattern of mild LVH. Systolic function was normal.   The estimated ejection fraction was in the range of 50% to 55%.   Wall motion was normal; there were no regional wall motion   abnormalities. Features are consistent with a pseudonormal left   ventricular filling pattern, with concomitant abnormal relaxation   and increased filling pressure (grade 2 diastolic dysfunction). - Aortic valve: Noncoronary cusp mobility was moderately   restricted. - Mitral valve: Calcified annulus.  Impressions:  - Technically difficult; definity used; normal LV systolic   function; mild LVH; moderate diastolic dysfunction.   Patient Profile     77 y.o. male with a hx of HTN, hypothyroid, OSA not on CPAP, pituitary cyst, prostate CA, obesity, nl cors 2008, PE after hand surgery on Xarelto, who was seen for the evaluation of atrial flutter.  Assessment & Plan    1.  Atrial flutter with rapid ventricular response  - spontaneous conversion to SR and has maintained.  Currently on oral Dilt - CHA2DS2-VASc = 5 (age x 2, HTN, CHF, prior PE) - Suspect he has been going in/out of this for a while, based on sx and readings on his apple watch. - on Eliquis  2. Chronic diastolic CHF - has not been weighing daily at home, but trying to improve his diet. Does not appear to be significantly volume overloaded. Ambulate today. Ordered dietary consult as patient request information on heart healthy diet.   Signed, Reino Bellis, NP  11/03/2017, 11:35 AM  Pager # [phone]   I have examined the patient and reviewed assessment and plan and discussed with patient.  Agree  with above as stated.  Maintaining NSR. Eliquis for stroke prevention.  Watch for bleeding.  Nutrition consult as well. OK to discharge from a cardiac standpoint.  Morbid obesity: Low salt diet and weight loss will be helpful.  Larae Grooms   For questions or updates, please contact [facility] Please consult [email or web address] for contact info under Cardiology/STEMI.

## 2017-11-03 NOTE — Plan of Care (Signed)
  Problem: Education: Goal: Knowledge of General Education information will improve Outcome: Adequate for Discharge   Problem: Health Behavior/Discharge Planning: Goal: Ability to manage health-related needs will improve Outcome: Adequate for Discharge   Problem: Clinical Measurements: Goal: Ability to maintain clinical measurements within normal limits will improve Outcome: Adequate for Discharge Goal: Will remain free from infection Outcome: Adequate for Discharge Goal: Diagnostic test results will improve Outcome: Adequate for Discharge Goal: Respiratory complications will improve Outcome: Adequate for Discharge Goal: Cardiovascular complication will be avoided Outcome: Adequate for Discharge   Problem: Activity: Goal: Risk for activity intolerance will decrease Outcome: Adequate for Discharge   Problem: Nutrition: Goal: Adequate nutrition will be maintained Outcome: Adequate for Discharge   Problem: Coping: Goal: Level of anxiety will decrease Outcome: Adequate for Discharge   Problem: Elimination: Goal: Will not experience complications related to bowel motility Outcome: Adequate for Discharge Goal: Will not experience complications related to urinary retention Outcome: Adequate for Discharge   Problem: Pain Managment: Goal: General experience of comfort will improve Outcome: Adequate for Discharge   Problem: Safety: Goal: Ability to remain free from injury will improve Outcome: Adequate for Discharge   Problem: Skin Integrity: Goal: Risk for impaired skin integrity will decrease Outcome: Adequate for Discharge   Problem: Education: Goal: Knowledge of disease or condition will improve Outcome: Adequate for Discharge Goal: Understanding of medication regimen will improve Outcome: Adequate for Discharge   Problem: Activity: Goal: Ability to tolerate increased activity will improve Outcome: Adequate for Discharge   Problem: Cardiac: Goal: Ability to  achieve and maintain adequate cardiopulmonary perfusion will improve Outcome: Adequate for Discharge   Problem: Health Behavior/Discharge Planning: Goal: Ability to safely manage health-related needs after discharge will improve Outcome: Adequate for Discharge

## 2017-11-03 NOTE — Progress Notes (Signed)
Mr Detwiler ambulated 600 feet in hall without assistive device. He reported minimal dyspnea and has O2 sat of 92% on room air upon return to room. He reports he lives in a 2 story home but master bedroom and all main living quarters are downstairs. He denies need for assistive device for home use.

## 2017-11-03 NOTE — Consult Note (Signed)
[facility] CM Primary Care Navigator  11/03/2017  Wayne Booth December 26, 1940 [id number]   Met withpatientat the bedsideto identify possible discharge needs. Patientreports having "severely fatigued, out of breath and with elevated heart rate readings from Apple watch". (Atrial flutter)  Patientendorses Dr.Stephen Yong Channel with [employer] [facility] [employer] his primary care provider.   Patient verbalizedusing[employer] producer [employer] to obtain medications without difficulty.   Patient reportsmanaginghis ownmedications at [employer] use of "pill box" system filled once a week.  Patient statesthathe has been driving prior to admission, but wife Wayne Booth) or son Wayne Booth) will providetransportation to hisdoctors'appointments if needed after discharge.  Patientmentionedthatson liveswith him and wife. His wife will be the primary caregiver at home and son will assistwithhis care when needed.  Anticipateddischarge planishomeaccording to patient.  Patientvoiced understanding to call primary care provider's officewhen hereturnshomefor a post discharge follow-up visit within1- 2weeksor sooner if needs arise.Patient letter (with PCP's contact number) was provided asareminder.   Discussed with patientregarding [facility] CM services available for health management at home buthedeniesany needs or concerns for now.  Patientverbalized understanding to seekreferral from primary care provider to [facility] care management if deemednecessaryand appropriate for any services in the future.  [facility] care management information was provided for future needs thathe may have.  Patienthadverbally agreedand optedforEMMIcalls tofollowup hisrecoveryat home.  Referral made for [facility] General calls after discharge.   For additional questions please contact:  Edwena Felty A. Lalena Salas, BSN, RN-BC [facility] PRIMARY CARE Navigator Cell:  [phone]

## 2017-11-03 NOTE — Progress Notes (Signed)
PROGRESS NOTE    Wayne Booth  ATF:[id number] DOB: 10-Jan-1941 DOA: 11/01/2017 PCP: Marin Olp, MD  Brief Narrative:77 y.o.malewithhistory of pituitary tumor status post transsphenoidal resection on Synthroid, hypertension, sleep apnea history of renal cell carcinoma status post nephrectomy presented to the ER admits in [facility] with complaints of fatigue weakness tachycardia and palpitations. Patient states over the last 2 weeks patient has been feeling intensely fatigued and his apple watch has been stating that he has been having elevated heart rate. Patient gets easily short of breath on exertion. At times he got chest pain and also dizziness. Did not lose consciousness. Today patient became very fatigued and decided to come to the ER after speaking to the PCPs office.Since this morning patient also has been having pain in the left thigh area. Denies any fall trauma or insect bite. No rash.  ED Course:In the ER patient was found to be in atrial flutter with RVR and was started on Cardizem infusion and apixaban. By time I examined the patient patient's rhythm was changed to sinus. Patient admitted for further management.     Assessment & Plan:   Principal Problem:   Atrial flutter with rapid ventricular response (HCC) Active Problems:   ADENOCARCINOMA, PROSTATE   History of renal cell carcinoma   Morbid obesity (HCC)   Essential hypertension   OSA (obstructive sleep apnea)   Hypothyroidism   History of pulmonary embolism   1. Atrial flutter with RVR-was started on Cardizem infusion presently in sinus rhythm. I have placed patient on Cardizem 120 mg CD.  Currently off Cardizem IV. TSH is normal cardiac enzymes normal echocardiogram is pending d-dimer is minimally elevated.  VQ scan of the chest low probability for PE.  Continue apixaban.  Reviewed cardiology notes.  Patient is already on apixaban twice a day.  This was started in the emergency  room. 2. Hypertension on ARB hydrochlorothiazide and presently on Cardizem. 3. History of pituitary tumor status post transsphenoidal resection presently on Synthroid replacement. History of sleep apnea was not able to use CPAP. 4. History of asthma per the chart has not been using any inhalers recently. 5. Morbid obesity.      DVT prophylaxis: Apixaban Code Status: Full code Family Communication: None Disposition Plan:  Patient lives at home with his wife.  Will obtain PT evaluation hopefully he can be discharged home tomorrow if no work-up is planned. Consultants:  [place] [facility] Procedures: None Antimicrobials: None Subjective: Feels better breathing is better  Objective: Vitals:   11/02/17 2300 11/03/17 0000 11/03/17 0414 11/03/17 0901  BP:  117/78 103/69 112/74  Pulse:  80 81 87  Resp: 12 17 15 19   Temp:  97.7 F (36.5 C) 97.8 F (36.6 C) 98.4 F (36.9 C)  TempSrc:  Oral Oral Oral  SpO2: 94% 95% 96% 97%  Weight:   133.5 kg (294 lb 4.8 oz)   Height:        Intake/Output Summary (Last 24 hours) at 11/03/2017 1127 Last data filed at 11/03/2017 0000 Gross per 24 hour  Intake 960 ml  Output -  Net 960 ml   Filed Weights   11/01/17 1832 11/02/17 0338 11/03/17 0414  Weight: 125.1 kg (275 lb 12.7 oz) 133.4 kg (294 lb 3.2 oz) 133.5 kg (294 lb 4.8 oz)    Examination:  General exam: Appears calm and comfortable  Respiratory system: decreased breath sounds  to auscultation. Respiratory effort normal. Cardiovascular system: S1 & S2 heard, RRR. No JVD, murmurs, rubs,  gallops or clicks. No pedal edema. Gastrointestinal system: Abdomen is nondistended, soft and nontender. No organomegaly or masses felt. Normal bowel sounds heard. Central nervous system: Alert and oriented. No focal neurological deficits. Extremities: edema  Skin: No rashes, lesions or ulcers Psychiatry: Judgement and insight appear normal. Mood & affect appropriate.     Data Reviewed: I have personally  reviewed following labs and imaging studies  CBC: Recent Labs  Lab 11/01/17 1434 11/02/17 0204  WBC 12.2* 10.0  HGB 17.9* 15.6  HCT 50.3 45.5  MCV 88.1 90.8  PLT 328 161   Basic Metabolic Panel: Recent Labs  Lab 11/01/17 1434 11/01/17 2038 11/02/17 0204  NA 133*  --  136  K 4.3  --  4.2  CL 98*  --  101  CO2 23  --  26  GLUCOSE 111*  --  95  BUN 17  --  14  CREATININE 1.26*  --  1.29*  CALCIUM 8.7*  --  8.5*  MG  --  1.9  --    GFR: Estimated Creatinine Clearance: 69 mL/min (A) (by C-G formula based on SCr of 1.29 mg/dL (H)). Liver Function Tests: Recent Labs  Lab 11/01/17 2038  AST 27  ALT 34  ALKPHOS 69  BILITOT 0.7  PROT 6.9  ALBUMIN 3.3*   No results for input(s): LIPASE, AMYLASE in the last 168 hours. No results for input(s): AMMONIA in the last 168 hours. Coagulation Profile: No results for input(s): INR, PROTIME in the last 168 hours. Cardiac Enzymes: Recent Labs  Lab 11/01/17 1434 11/01/17 2038 11/02/17 0204 11/02/17 0819  TROPONINI <0.03 <0.03 <0.03 <0.03   BNP (last 3 results) No results for input(s): PROBNP in the last 8760 hours. HbA1C: No results for input(s): HGBA1C in the last 72 hours. CBG: No results for input(s): GLUCAP in the last 168 hours. Lipid Profile: No results for input(s): CHOL, HDL, LDLCALC, TRIG, CHOLHDL, LDLDIRECT in the last 72 hours. Thyroid Function Tests: Recent Labs    11/01/17 2038  TSH 1.897   Anemia Panel: No results for input(s): VITAMINB12, FOLATE, FERRITIN, TIBC, IRON, RETICCTPCT in the last 72 hours. Sepsis Labs: No results for input(s): PROCALCITON, LATICACIDVEN in the last 168 hours.  No results found for this or any previous visit (from the past 240 hour(s)).       Radiology Studies: Nm Pulmonary Perf And Vent  Result Date: 11/02/2017 CLINICAL DATA:  Shortness of breath, tachycardia, history of blood clots, LEFT lower leg pain, elevated D-dimer, question pulmonary embolism EXAM: NUCLEAR  MEDICINE VENTILATION - PERFUSION LUNG SCAN TECHNIQUE: Ventilation images were obtained in multiple projections using inhaled aerosol Tc-30m DTPA. Perfusion images were obtained in multiple projections after intravenous injection of Tc-63m-MAA. RADIOPHARMACEUTICALS:  30 mCi of Tc-14m DTPA aerosol inhalation and 6 mCi Tc66m-MAA IV COMPARISON:  None Correlation: Chest radiograph 11/01/2017 FINDINGS: Ventilation: Central airway deposition of aerosol. Enlargement of cardiac silhouette. No focal ventilation defects. Perfusion: Mild enlargement of cardiac silhouette. Fusion otherwise normal. No segmental or subsegmental perfusion defects. Chest radiograph: Mild enlargement of cardiac silhouette. IMPRESSION: Cardiomegaly. Very low probability for pulmonary embolism. Electronically Signed   By: Lavonia Dana M.D.   On: 11/02/2017 12:40   Dg Chest Port 1 View  Result Date: 11/01/2017 CLINICAL DATA:  77 year old male with palpitations, dizziness, tachycardia, shortness of breath. EXAM: PORTABLE CHEST 1 VIEW COMPARISON:  CTA chest 10/20/2016 and earlier. FINDINGS: Portable AP semi upright view at 1511 hours. Mildly lower lung volumes. Stable cardiac size and mediastinal contours.  Pacer or resuscitation pads project over the left chest. No pneumothorax, pulmonary edema, pleural effusion or confluent pulmonary opacity. Visualized tracheal air column is within normal limits. Cervical ACDF hardware. No acute osseous abnormality identified. IMPRESSION: Lower lung volumes.  No acute cardiopulmonary abnormality. Electronically Signed   By: Genevie Ann M.D.   On: 11/01/2017 15:30        Scheduled Meds: . apixaban  5 mg Oral BID  . diltiazem  120 mg Oral Daily  . escitalopram  20 mg Oral Daily  . levothyroxine  150 mcg Oral QAC breakfast  . losartan  50 mg Oral Daily  . mouth rinse  15 mL Mouth Rinse BID  . multivitamin with minerals  1 tablet Oral Daily  . nortriptyline  75 mg Oral QHS  . traZODone  50 mg Oral QHS    Continuous Infusions:   LOS: 2 days    Georgette Shell, MD [facility]  If 7PM-7AM, please contact night-coverage [email or web address] Password [facility] 11/03/2017, 11:27 AM

## 2017-11-03 NOTE — Progress Notes (Signed)
Per insurance check on Eliquis  # 5. S/W  JADA  @ [employer] # [phone]    ELIQUIS 5 MG BID  COVER- YES  CO-PAY- $ 45.00  TIER- 3 DRUG  PRIOR APPROVAL- NO   PREFERRED PHARMACY - YES - [facility] AND [facility]

## 2017-11-04 ENCOUNTER — Telehealth: Payer: Self-pay | Admitting: Interventional Cardiology

## 2017-11-04 ENCOUNTER — Telehealth: Payer: Self-pay | Admitting: *Deleted

## 2017-11-04 NOTE — Telephone Encounter (Signed)
Per chart review: Admit date: 11/01/2017 Discharge date: 11/03/2017  Admitted From:home Disposition:home Recommendations for Outpatient Follow-up:  1. Follow up with PCP in 1-2 weeks 2. Please obtain BMP/CBC in one week  Home Health:none Equipment/Devicesnone Discharge Conditionstable CODE STATUS full Diet recommendation:cardiac  _______________________________________________________ Per telephone call: Transition Care Management Follow-up Telephone Call   Date discharged? 11/03/17   How have you been since you were released from the hospital? "pretty good"   Do you understand why you were in the hospital? yes   Do you understand the discharge instructions? yes   Where were you discharged to? Home   Items Reviewed:  Medications reviewed: yes  Allergies reviewed: yes  Dietary changes reviewed: yes. Patient asked to schedule an appointment with Inda Coke to discuss heart health diet.   Referrals reviewed: yes   Functional Questionnaire:   Activities of Daily Living (ADLs):   He states they are independent in the following: ambulation, bathing and hygiene, feeding, continence, grooming, toileting and dressing States they require assistance with the following: none   Any transportation issues/concerns?: no   Any patient concerns? no   Confirmed importance and date/time of follow-up visits scheduled yes  Provider Appointment booked with Dr Yong Channel 11/09/17  Confirmed with patient if condition begins to worsen call PCP or go to the ER.  Patient was given the office number and encouraged to call back with question or concerns.  : yes

## 2017-11-04 NOTE — Telephone Encounter (Signed)
noted thanks  

## 2017-11-04 NOTE — Telephone Encounter (Signed)
New Message:       Pt states he is due to have an 2 wk f/u with [place]. I suggested  PA but pt would only like to see varanasi.

## 2017-11-04 NOTE — Telephone Encounter (Signed)
Returned call to patient. Patient recently d/cd from the hospital calling in for post hospital f/u appointment. Patient only wants to see Dr. Irish Booth. Patient scheduled for 5/16 at 11:40 AM.

## 2017-11-05 ENCOUNTER — Encounter: Payer: Self-pay | Admitting: Physician Assistant

## 2017-11-05 ENCOUNTER — Ambulatory Visit (INDEPENDENT_AMBULATORY_CARE_PROVIDER_SITE_OTHER): Payer: Medicare Other | Admitting: Physician Assistant

## 2017-11-05 DIAGNOSIS — I10 Essential (primary) hypertension: Secondary | ICD-10-CM | POA: Diagnosis not present

## 2017-11-05 DIAGNOSIS — Z713 Dietary counseling and surveillance: Secondary | ICD-10-CM | POA: Diagnosis not present

## 2017-11-05 NOTE — Progress Notes (Signed)
MD SMOLA is a 77 y.o. male is here to discuss: Nutrition and Heart Healthy Diet  I acted as a Education administrator for [employer], PA-C Anselmo Pickler, LPN  History of Present Illness:   Chief Complaint  Patient presents with  . Discuss Nutrition and Diet    Pt here to discuss Heart Healthy Diet. Pt was hospitalized Monday 5/6-5/8 and was told to follow Heart Healthy Diet. His diet currently includes daily breakfast meats.  Since his hospitalization he has been using Mrs. Deliah Boston, he has been trying to cut out processed foods.  He tells me that he has stopped using his saltshaker.  He was told that he could no longer eat canned foods, boxed foods, or any packaged foods.  He likes fish and has been trying to eat more of it and he has been baking it.  He was also told that he can go back to his regular activities, he enjoys swimming at the [facility].  Patient reports that he is trying to change his lifestyle, and his wife is helping him to do so.  He eats beef or pork approximately 5 to 6 days a week.  When he eats meat, portion sizes are at least 8 ounces.   Wt Readings from Last 10 Encounters:  11/05/17 291 lb (132 kg)  11/03/17 294 lb 4.8 oz (133.5 kg)  08/11/17 299 lb (135.6 kg)  07/13/17 299 lb 9.6 oz (135.9 kg)  07/12/17 (!) 300 lb 3.2 oz (136.2 kg)  06/18/17 290 lb (131.5 kg)  06/14/17 297 lb 6.4 oz (134.9 kg)  05/27/17 290 lb (131.5 kg)  05/13/17 290 lb (131.5 kg)  04/27/17 (!) 301 lb (136.5 kg)   This is the second time that we have met with each other -- the first time was 04/17/17.  There are no preventive care reminders to display for this patient.  Past Medical History:  Diagnosis Date  . Allergy    States his nose runs when he is around grease.   Marland Kitchen Anxiety   . Arthritis   . Cancer ([place])   . History of total knee replacement   . Hypertension   . Hypothyroidism   . Kidney cancer, primary, with metastasis from kidney to other site [facility])   . Kidney stone   . OSA  (obstructive sleep apnea)    pt does not wear cpap at night  . Pituitary cyst ([place])   . Pneumonia    hx of  . Prostate cancer [facility])      Social History   Socioeconomic History  . Marital status: Married    Spouse name: [name]  . Number of children: Y  . Years of education: Not on file  . Highest education level: Not on file  Occupational History  . Occupation: retired Armed forces operational officer: RETIRED  Social Needs  . Financial resource strain: Not on file  . Food insecurity:    Worry: Not on file    Inability: Not on file  . Transportation needs:    Medical: Not on file    Non-medical: Not on file  Tobacco Use  . Smoking status: Former Smoker    Years: 4.00    Types: Cigars  . Smokeless tobacco: Never Used  . Tobacco comment: 1 cigar per month, with golf, last one was 3-4 months.   Substance and Sexual Activity  . Alcohol use: Yes    Alcohol/week: 1.2 oz    Types: 2 Shots of liquor  per week    Comment: socially  . Drug use: No  . Sexual activity: Not on file  Lifestyle  . Physical activity:    Days per week: Not on file    Minutes per session: Not on file  . Stress: Not on file  Relationships  . Social connections:    Talks on phone: Not on file    Gets together: Not on file    Attends religious service: Not on file    Active member of club or organization: Not on file    Attends meetings of clubs or organizations: Not on file    Relationship status: Not on file  . Intimate partner violence:    Fear of current or ex partner: Not on file    Emotionally abused: Not on file    Physically abused: Not on file    Forced sexual activity: Not on file  Other Topics Concern  . Not on file  Social History Narrative   Married 1965 (wife patient of Dr. Megan Salon). 2 sons. 4 granddaughters.       Retired Chemical engineer.       Hobbies: golf, fishing, formerly hunting.     Past Surgical History:  Procedure Laterality Date  . ANTERIOR  CERVICAL DECOMP/DISCECTOMY FUSION N/A 08/29/2012   Procedure: ANTERIOR CERVICAL DECOMPRESSION/DISCECTOMY FUSION 1 LEVEL;  Surgeon: Eustace Moore, MD;  Location: [place] NEUR[facility] ORS;  Service: Neurosurgery;  Laterality: N/A;  . APPENDECTOMY    . CARDIAC CATHETERIZATION  2008   clean  . CHOLECYSTECTOMY    . COLONOSCOPY W/ POLYPECTOMY    . CRANIOTOMY N/A 11/08/2012   Procedure: CRANIOTOMY HYPOPHYSECTOMY TRANSNASAL APPROACH;  Surgeon: Faythe Ghee, MD;  Location: [place] NEURO ORS;  Service: Neurosurgery;  Laterality: N/A;  Transphenoidal Resection of Pituitary Tumor  . FINGER SURGERY Left 2017   Dr Amedeo Plenty  . INSERTION PROSTATE RADIATION SEED    . KIDNEY STONE SURGERY    . KNEE ARTHROSCOPY  2007   left  . NEPHRECTOMY Left   . POSTERIOR CERVICAL LAMINECTOMY Left 04/24/2015   Procedure: Foraminotomy cervical five - cervical six cervical six - cervical seven left;  Surgeon: Eustace Moore, MD;  Location: [facility];  Service: Neurosurgery;  Laterality: Left;  . REPLACEMENT TOTAL KNEE BILATERAL      Family History  Problem Relation Age of Onset  . Cancer Father        [place], died before patient born  . Cancer Mother        stomach, died when he was young  . Kidney cancer Sister   . Colon cancer Brother 36  . Esophageal cancer Neg Hx   . Rectal cancer Neg Hx   . Stomach cancer Neg Hx     PMHx, SurgHx, SocialHx, FamHx, Medications, and Allergies were reviewed in the Visit Navigator and updated as appropriate.   Patient Active Problem List   Diagnosis Date Noted  . Anticoagulated   . Atrial flutter with rapid ventricular response ([place]) 11/01/2017  . Bilateral foot pain 07/13/2017  . Community acquired pneumonia 06/18/2017  . Shortness of breath 06/14/2017  . History of adenomatous polyp of colon 04/12/2017  . History of pulmonary embolism 10/23/2016  . Cervical disc disease 11/13/2015  . Essential tremor 08/14/2015  . Gout 08/14/2015  . Hypothyroidism 07/12/2014  . Hyperglycemia  07/12/2014  . GAD (generalized anxiety disorder) 11/07/2013  . OSA (obstructive sleep apnea) 07/07/2011  . Asthmatic bronchitis 06/09/2011  . Actinic keratosis 11/27/2009  .  Osteoarthritis 11/13/2008  . 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 Morbid obesity ([place]) 10/02/2008  . History of renal cell carcinoma 12/26/2007  . TESTOSTERONE DEFICIENCY 12/28/2006  . Essential hypertension 12/28/2006    Social History   Tobacco Use  . Smoking status: Former Smoker    Years: 4.00    Types: Cigars  . Smokeless tobacco: Never Used  . Tobacco comment: 1 cigar per month, with golf, last one was 3-4 months.   Substance Use Topics  . Alcohol use: Yes    Alcohol/week: 1.2 oz    Types: 2 Shots of liquor per week    Comment: socially  . Drug use: No    Current Medications and Allergies:    Current Outpatient Medications:  .  albuterol (PROVENTIL HFA;VENTOLIN HFA) 108 (90 Base) MCG/ACT inhaler, Inhale 2 puffs into the lungs every 6 (six) hours as needed for wheezing or shortness of breath., Disp: 1 Inhaler, Rfl: 5 .  apixaban (ELIQUIS) 5 MG TABS tablet, Take 1 tablet (5 mg total) by mouth 2 (two) times daily., Disp: 60 tablet, Rfl: 1 .  aspirin 325 MG tablet, Take 325 mg by mouth daily., Disp: , Rfl:  .  budesonide-formoterol (SYMBICORT) 160-4.5 MCG/ACT inhaler, Inhale 2 puffs into the lungs 2 (two) times daily. (Patient taking differently: Inhale 2 puffs into the lungs 2 (two) times daily as needed (SOB). ), Disp: 1 Inhaler, Rfl: 0 .  clotrimazole-betamethasone (LOTRISONE) cream, Apply 1 application topically 2 (two) times daily. Use for 10 days maximum., Disp: 45 g, Rfl: 1 .  cyclobenzaprine (FLEXERIL) 10 MG tablet, Take 10 mg by mouth daily as needed for muscle pain., Disp: , Rfl:  .  diltiazem (CARDIZEM CD) 120 MG 24 hr capsule, Take 1 capsule (120 mg total) by mouth daily., Disp: 30 capsule, Rfl: 0 .  escitalopram (LEXAPRO) 20 MG tablet, Take 1 tablet (20 mg total) by mouth daily. TAKE 20 mg  every day, Disp: 90 tablet, Rfl: 3 .  levothyroxine (SYNTHROID, LEVOTHROID) 150 MCG tablet, TAKE ONE TABLET BY MOUTH DAILY, Disp: 90 tablet, Rfl: 2 .  losartan (COZAAR) 50 MG tablet, Take 1 tablet (50 mg total) by mouth daily., Disp: 90 tablet, Rfl: 3 .  Multiple Vitamins-Minerals (MULTIVITAMINS THER. W/MINERALS) TABS, Take 1 tablet by mouth daily., Disp: , Rfl:  .  nortriptyline (PAMELOR) 75 MG capsule, TAKE ONE CAPSULE BY MOUTH AT BEDTIME, Disp: 90 capsule, Rfl: 1 .  traMADol (ULTRAM) 50 MG tablet, Take 50 mg by mouth daily as needed for severe pain., Disp: , Rfl:  .  traZODone (DESYREL) 50 MG tablet, TAKE ONE HALF TO ONE TABLET BY MOUTH EVERY NIGHT AT BEDTIME AS NEEDED FOR SLEEP, Disp: 90 tablet, Rfl: 2  No Known Allergies  Review of Systems   ROS Negative unless otherwise specified per HPI.   Vitals:   Vitals:   11/05/17 0835  BP: 126/74  Pulse: 83  Temp: 97.8 F (36.6 C)  TempSrc: Oral  SpO2: 94%  Weight: 291 lb (132 kg)  Height: _0  (1.803 m)     Body mass index is 40.59 kg/m.   Physical Exam:    Physical Exam  Constitutional: He appears well-developed and well-nourished.  HENT:  Head: Atraumatic.  Eyes: Pupils are equal, round, and reactive to light. Conjunctivae and EOM are normal.  Neck: Normal range of motion.  Pulmonary/Chest: Effort normal.  Neurological: He is alert.  Skin: Skin is warm and dry.  Psychiatric: He has a normal mood and affect. His behavior  is normal. Judgment and thought content normal.  Nursing note and vitals reviewed.    Assessment and Plan:    Leelan was seen today for discuss nutrition and diet.  Diagnoses and all orders for this visit:  Morbid obesity ([place])  Encounter for nutritional counseling  Essential hypertension   Today we discussed heart healthy diet.  I reviewed all of my recommendations with patient including: wwitching butter to Smart Balance, cessation of use of saltshaker, using non-salt spices including  Mrs. Dash and garlic powder, resuming physical activities per cardiology recommendations, decreasing portion sizes of meats, limiting breakfast meats and processed meats, choosing lean poultry and fish, need for increase of fresh fruits and vegetables.  Patient is very motivated.  He would like to follow-up with me.  We scheduled this for 1 month.  I spent 25 minutes with this patient, greater than 50% was face-to-face time counseling regarding the above diagnoses.   . Reviewed expectations re: course of current medical issues. . Discussed self-management of symptoms. . Outlined signs and symptoms indicating need for more acute intervention. . Patient verbalized understanding and all questions were answered. . See orders for this visit as documented in the electronic medical record. . Patient received an After Visit Summary.  CMA or LPN served as scribe during this visit. History, Physical, and Plan performed by medical provider. Documentation and orders reviewed and attested to.  Inda Coke, PA-C [place] [place] 11/05/2017  Follow-up: No follow-ups on file.

## 2017-11-05 NOTE — Patient Instructions (Addendum)
It was great to see you!   

## 2017-11-08 ENCOUNTER — Ambulatory Visit: Payer: Medicare Other | Admitting: Adult Health

## 2017-11-09 ENCOUNTER — Ambulatory Visit (INDEPENDENT_AMBULATORY_CARE_PROVIDER_SITE_OTHER): Payer: Medicare Other | Admitting: Family Medicine

## 2017-11-09 ENCOUNTER — Encounter: Payer: Self-pay | Admitting: Family Medicine

## 2017-11-09 VITALS — BP 114/72 | HR 78 | Temp 97.5°F | Ht 71.0 in | Wt 291.0 lb

## 2017-11-09 DIAGNOSIS — I10 Essential (primary) hypertension: Secondary | ICD-10-CM

## 2017-11-09 DIAGNOSIS — I4892 Unspecified atrial flutter: Secondary | ICD-10-CM

## 2017-11-09 DIAGNOSIS — E890 Postprocedural hypothyroidism: Secondary | ICD-10-CM

## 2017-11-09 DIAGNOSIS — R0602 Shortness of breath: Secondary | ICD-10-CM | POA: Diagnosis not present

## 2017-11-09 LAB — CBC
HCT: 48.8 % (ref 39.0–52.0)
Hemoglobin: 16.4 g/dL (ref 13.0–17.0)
MCHC: 33.7 g/dL (ref 30.0–36.0)
MCV: 90.1 fl (ref 78.0–100.0)
Platelets: 399 10*3/uL (ref 150.0–400.0)
RBC: 5.42 Mil/uL (ref 4.22–5.81)
RDW: 14.5 % (ref 11.5–15.5)
WBC: 11.8 10*3/uL — AB (ref 4.0–10.5)

## 2017-11-09 LAB — BASIC METABOLIC PANEL
BUN: 16 mg/dL (ref 6–23)
CO2: 29 meq/L (ref 19–32)
Calcium: 9.1 mg/dL (ref 8.4–10.5)
Chloride: 99 mEq/L (ref 96–112)
Creatinine, Ser: 1.17 mg/dL (ref 0.40–1.50)
GFR: 64.53 mL/min (ref >60.00)
GLUCOSE: 77 mg/dL (ref 70–99)
POTASSIUM: 4.3 meq/L (ref 3.5–5.1)
SODIUM: 137 meq/L (ref 135–145)

## 2017-11-09 LAB — T4, FREE: Free T4: 0.72 ng/dL (ref 0.60–1.60)

## 2017-11-09 LAB — T3, FREE: T3 FREE: 3.3 pg/mL (ref 2.3–4.2)

## 2017-11-09 MED ORDER — LOSARTAN POTASSIUM 25 MG PO TABS: 25.0000 mg | ORAL_TABLET | Freq: Every day | ORAL | 3 refills | Status: DC

## 2017-11-09 NOTE — Progress Notes (Signed)
Blood work is reassuring  your CBC was largely normal (blood counts, infection fighting cells, platelets) you had a very slight elevation in your white blood cells/ Infection fighting cells- given only mild elevation we can just simply repeat next time Your BMET was normal (kidney, electrolytes, blood sugar).Kidney function has improved back to normal Your thyroid was normal with the T3 and T4 level

## 2017-11-09 NOTE — Patient Instructions (Addendum)
Reduce losartan to 25mg    If you dont mind would love to recheck in next 2-4 weeks and see if your symptoms of lightheadedness are improving  We will call you within a week or two about your referral to cardiac rehab. If you do not hear within 3 weeks, give [place] a call or just chat with me at visit  Stop aspirin 325 mg  From my perspective you should be on  1. Diltiazem/cardizem just once a day 2. Eliquis- you should be on twice a day  You can confirm this with your pharmacy- take bottles to them plus this sheet of paper  9:30 Am in 2 weeks- they can schedule you in the same day appointment slot.   Please stop by lab before you go

## 2017-11-09 NOTE — Progress Notes (Signed)
Subjective:  Wayne Booth is a 77 y.o. year old very pleasant male patient who presents for transitional care management and hospital follow up for atrial fibrillation with rapid ventricular response. Patient was hospitalized from 11/01/2017 to 11/03/2017. A TCM phone call was completed on 11/04/2017. 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  He was noticing shortness of breath increased from baseline.  He was getting occasional twinges of chest pain as well as dizziness.  He had no falls or loss consciousness.  On the day of visit to the emergency room his symptoms intensified.  The atrial flutter was treated with Cardizem infusion-patient converted to sinus rhythm.  Patient was placed on Cardizem 120 mg.  His TSH was normal.  His echocardiogram was reassuring though difficult due to obesity. He did have some grade 2 diastolic dysfunction though.  Minimally elevated d-dimer and VQ scan of the chest was low probability for PE.  He was continued on his chronic apixaban given his history of PE.  Hypertension was controlled in the hospital on Cardizem and losartan.  In the hospital his hydrochlorothiazide was held.  Hydrochlorothiazide was stopped upon discharge.  We already stopped propranolol in recent past due to concern of worsening bronchospasm. 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  He had a chest x-ray due to chest pain.   I personally reviewed this film from 11/01/2017 andshowed cardiomegaly and low lung volumes but otherwise no acute cardiopulmonary abnormalities   For his morbid obesity- he is seen Inda Coke since discharge-he has planned follow-up in 1 month    GFR was 53 at discharge- we will recheck today   See problem oriented charting as well ROS- denies current chest pain, shortness of breath has improved. No increased edema. Admits to higher anxiety/ease of agitation  Past Medical History-  Patient Active Problem List   Diagnosis Date Noted  . Atrial flutter ([place]) 11/01/2017    Priority: High  . Shortness of breath 06/14/2017    Priority: High  . History of pulmonary embolism 10/23/2016    Priority: High  . 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 History of renal cell carcinoma 12/26/2007    Priority: High  . History of adenomatous polyp of colon 04/12/2017    Priority: Medium  . Cervical disc disease 11/13/2015    Priority: Medium  . Essential tremor 08/14/2015    Priority: Medium  . Gout 08/14/2015    Priority: Medium  . Hypothyroidism 07/12/2014    Priority: Medium  . Hyperglycemia 07/12/2014    Priority: Medium  . GAD (generalized anxiety disorder) 11/07/2013    Priority: Medium  . OSA (obstructive sleep apnea) 07/07/2011    Priority: Medium  . Morbid obesity ([place]) 10/02/2008    Priority: Medium  . Essential hypertension 12/28/2006    Priority: Medium  . Asthmatic bronchitis 06/09/2011    Priority: Low  . Actinic keratosis 11/27/2009    Priority: Low  . Osteoarthritis 11/13/2008    Priority: Low  .  TESTOSTERONE DEFICIENCY 12/28/2006    Priority: Low  . Anticoagulated   . Bilateral foot pain 07/13/2017  . Community acquired pneumonia 06/18/2017    Medications- reviewed and updated  A medical reconciliation was performed comparing current medicines to hospital discharge medications. Current Outpatient Medications  Medication Sig Dispense Refill   . albuterol (PROVENTIL HFA;VENTOLIN HFA) 108 (90 Base) MCG/ACT inhaler Inhale 2 puffs into the lungs every 6 (six) hours as needed for wheezing or shortness of breath. 1 Inhaler 5  . apixaban (ELIQUIS) 5 MG TABS tablet Take 1 tablet (5 mg total) by mouth 2 (two) times daily. 60 tablet 1  . clotrimazole-betamethasone (LOTRISONE) cream Apply 1 application topically 2 (two) times daily. Use for 10 days maximum. 45 g 1  . cyclobenzaprine (FLEXERIL) 10 MG tablet Take 10 mg by mouth daily as needed for muscle pain.    Marland Kitchen diltiazem (CARDIZEM CD) 120 MG 24 hr capsule Take 1 capsule (120 mg total) by mouth daily. 30 capsule 0  . escitalopram (LEXAPRO) 20 MG tablet Take 1 tablet (20 mg total) by mouth daily. TAKE 20 mg every day 90 tablet 3  . levothyroxine (SYNTHROID, LEVOTHROID) 150 MCG tablet TAKE ONE TABLET BY MOUTH DAILY 90 tablet 2  . losartan (COZAAR) 25 MG tablet Take 1 tablet (25 mg total) by mouth daily. 90 tablet 3  . Multiple Vitamins-Minerals (MULTIVITAMINS THER. W/MINERALS) TABS Take 1 tablet by mouth daily.    . nortriptyline (PAMELOR) 75 MG capsule TAKE ONE CAPSULE BY MOUTH AT BEDTIME 90 capsule 1  . traMADol (ULTRAM) 50 MG tablet Take 50 mg by mouth daily as needed for severe pain.    . traZODone (DESYREL) 50 MG tablet TAKE ONE HALF TO ONE TABLET BY MOUTH EVERY NIGHT AT BEDTIME AS NEEDED FOR SLEEP 90 tablet 2   No current facility-administered medications for this visit.     Objective: BP 114/72 (BP Location: Left Arm, Patient Position: Sitting, Cuff Size: Large)   Pulse 78   Temp (!) 97.5 F (36.4 C) (Oral)   Ht 5\' 11"  (1.803 m)   Wt 291 lb (132 kg)   SpO2 93%   BMI 40.59 kg/m  Gen: NAD, resting comfortably CV: RRR no murmurs rubs or gallops Lungs: CTAB no crackles, wheeze, rhonchi Abdomen: soft/nontender/nondistended/normal bowel sounds. obese Ext: trace edema Skin: warm, dry Neuro: normal gait and speech  Assessment/Plan:  Atrial flutter (HCC) Rate controlled on  cardizem 120mg  daily (he thinks he got a bottle saying BID and I told him I thought he had this confused with eliquis so he will verify with pharmacy- see avs). Also anticoagulated on eliquis.   No recurrence of RVR.   He is anxious about starting exercise regimen so we will try to get him into cardiac rehab to help assit him if he qualifies. He also admits some anxiety/agitation since hospitalization. We discussed if not improved by follow up that we could consider counseling or medication.   His asa 325 was not stopped in the hospital- I advised him to stop this today.   Essential hypertension Reduce losartan to 25mg  due to orthostatic symptoms. Continue cardizem. His hctz was stopped in hospital. Propranolol stopped in prior past due to concern of bronchospasm  Hypothyroidism TSh normal in hospital but has central hypothyroidism so not helpful- we got t3 and t4 today which were normal  Morbid obesity ([place]) He is trying to work through dietary changes as discussed with Inda Coke, [place] and also nutritionist- I encouraged him in  this as well as to follow up.    Future Appointments  Date Time Provider [place]  11/11/2017 11:40 AM Jettie Booze, MD [facility]  11/23/2017  9:30 AM Marin Olp, MD [facility]  12/06/2017  8:00 AM Inda Coke, PA [facility]   Advised 2-4 week blood pressure recheck   Lab/Order associations: Atrial flutter, unspecified type ([place]) - Plan: Amb Referral to Cardiac Rehabilitation, CBC, Basic metabolic panel, T4, free, T3, free  Essential hypertension - Plan: CBC, Basic metabolic panel  Postoperative hypothyroidism - Plan: T4, free, T3, free  Meds ordered this encounter  Medications  . losartan (COZAAR) 25 MG tablet    Sig: Take 1 tablet (25 mg total) by mouth daily.    Dispense:  90 tablet    Refill:  3   Return precautions advised.  [name], MD

## 2017-11-10 NOTE — Assessment & Plan Note (Signed)
TSh normal in hospital but has central hypothyroidism so not helpful- we got t3 and t4 today which were normal

## 2017-11-10 NOTE — Assessment & Plan Note (Addendum)
Rate controlled on cardizem 120mg  daily (he thinks he got a bottle saying BID and I told him I thought he had this confused with eliquis so he will verify with pharmacy- see avs). Also anticoagulated on eliquis.   No recurrence of RVR.   He is anxious about starting exercise regimen so we will try to get him into cardiac rehab to help assit him if he qualifies. He also admits some anxiety/agitation since hospitalization. We discussed if not improved by follow up that we could consider counseling or medication.   His asa 325 was not stopped in the hospital- I advised him to stop this today.

## 2017-11-10 NOTE — Progress Notes (Signed)
Cardiology Office Note   Date:  11/11/2017   ID:  KEY CEN, DOB 1941-02-26, MRN [id number]  PCP:  Marin Olp, MD    No chief complaint on file.  Atrial flutter  Wt Readings from Last 3 Encounters:  11/11/17 294 lb (133.4 kg)  11/09/17 291 lb (132 kg)  11/05/17 291 lb (132 kg)       History of Present Illness: Wayne Booth is a 77 y.o. male  With obesity and OSA.  He has had HTN as well.  He has had dizziness intermittently for years, but it got worse in early Dec 2017.  His BP meds were reduced and he has had no further episodes.  Dizziness would occur would when going from sitting to standing.  No syncope.   He had a cath in 4/08 that was normal.    He had hand surgery in the past and then a few weeks later, was diagnosed with PE.  He was started on Xarelto.  Diagnosed with atrial flutter in 5/19.  Hospitalized and converted to NSR.  He was discharged on Eliquis.   Denies : Chest pain.  Leg edema. Nitroglycerin use. Orthopnea. Palpitations. Paroxysmal nocturnal dyspnea. Shortness of breath. Syncope.    Past Medical History:  Diagnosis Date  . Allergy    States his nose runs when he is around grease.   Marland Kitchen Anxiety   . Arthritis   . Cancer ([place])   . History of total knee replacement   . Hypertension   . Hypothyroidism   . Kidney cancer, primary, with metastasis from kidney to other site [facility])   . Kidney stone   . OSA (obstructive sleep apnea)    pt does not wear cpap at night  . Pituitary cyst ([place])   . Pneumonia    hx of  . Prostate cancer [facility])     Past Surgical History:  Procedure Laterality Date  . ANTERIOR CERVICAL DECOMP/DISCECTOMY FUSION N/A 08/29/2012   Procedure: ANTERIOR CERVICAL DECOMPRESSION/DISCECTOMY FUSION 1 LEVEL;  Surgeon: Eustace Moore, MD;  Location: [place] NEURO ORS;  Service: Neurosurgery;  Laterality: N/A;  . APPENDECTOMY    . CARDIAC CATHETERIZATION  2008   clean  . CHOLECYSTECTOMY    . COLONOSCOPY W/ POLYPECTOMY    .  CRANIOTOMY N/A 11/08/2012   Procedure: CRANIOTOMY HYPOPHYSECTOMY TRANSNASAL APPROACH;  Surgeon: Faythe Ghee, MD;  Location: [place] NEURO ORS;  Service: Neurosurgery;  Laterality: N/A;  Transphenoidal Resection of Pituitary Tumor  . FINGER SURGERY Left 2017   Dr Amedeo Plenty  . INSERTION PROSTATE RADIATION SEED    . KIDNEY STONE SURGERY    . KNEE ARTHROSCOPY  2007   left  . NEPHRECTOMY Left   . POSTERIOR CERVICAL LAMINECTOMY Left 04/24/2015   Procedure: Foraminotomy cervical five - cervical six cervical six - cervical seven left;  Surgeon: Eustace Moore, MD;  Location: [facility]S;  Service: Neurosurgery;  Laterality: Left;  . REPLACEMENT TOTAL KNEE BILATERAL       Current Outpatient Medications  Medication Sig Dispense Refill  . albuterol (PROVENTIL HFA;VENTOLIN HFA) 108 (90 Base) MCG/ACT inhaler Inhale 2 puffs into the lungs every 6 (six) hours as needed for wheezing or shortness of breath. 1 Inhaler 5  . apixaban (ELIQUIS) 5 MG TABS tablet Take 1 tablet (5 mg total) by mouth 2 (two) times daily. 60 tablet 1  . clotrimazole-betamethasone (LOTRISONE) cream Apply 1 application topically 2 (two) times daily. Use for 10 days maximum. [place]  g 1  . cyclobenzaprine (FLEXERIL) 10 MG tablet Take 10 mg by mouth daily as needed for muscle pain.    Marland Kitchen diltiazem (CARDIZEM CD) 120 MG 24 hr capsule Take 1 capsule (120 mg total) by mouth daily. 30 capsule 0  . escitalopram (LEXAPRO) 20 MG tablet Take 1 tablet (20 mg total) by mouth daily. TAKE 20 mg every day 90 tablet 3  . levothyroxine (SYNTHROID, LEVOTHROID) 150 MCG tablet TAKE ONE TABLET BY MOUTH DAILY 90 tablet 2  . losartan (COZAAR) 25 MG tablet Take 1 tablet (25 mg total) by mouth daily. 90 tablet 3  . Multiple Vitamins-Minerals (MULTIVITAMINS THER. W/MINERALS) TABS Take 1 tablet by mouth daily.    . nortriptyline (PAMELOR) 75 MG capsule TAKE ONE CAPSULE BY MOUTH AT BEDTIME 90 capsule 1  . traMADol (ULTRAM) 50 MG tablet Take 50 mg by mouth daily as needed  for severe pain.    . traZODone (DESYREL) 50 MG tablet TAKE ONE HALF TO ONE TABLET BY MOUTH EVERY NIGHT AT BEDTIME AS NEEDED FOR SLEEP 90 tablet 2   No current facility-administered medications for this visit.     Allergies:   Patient has no known allergies.    Social History:  The patient  reports that he has quit smoking. His smoking use included cigars. He quit after 4.00 years of use. He has never used smokeless tobacco. He reports that he drinks about 1.2 oz of alcohol per week. He reports that he does not use drugs.   Family History:  The patient's family history includes Cancer in his father and mother; Colon cancer (age of onset: 37) in his brother; Kidney cancer in his sister.    ROS:  Please see the history of present illness.   Otherwise, review of systems are positive for mild dizziness with standing.   All other systems are reviewed and negative.    PHYSICAL EXAM: VS:  BP 132/64   Pulse 87   Ht 5\' 11"  (1.803 m)   Wt 294 lb (133.4 kg)   SpO2 95%   BMI 41.00 kg/m  , BMI Body mass index is 41 kg/m. GEN: Well nourished, well developed, in no acute distress  HEENT: normal  Neck: no JVD, carotid bruits, or masses Cardiac: RRR; no murmurs, rubs, or gallops,no edema  Respiratory:  clear to auscultation bilaterally, normal work of breathing GI: soft, nontender, nondistended, + BS, obese MS: no deformity or atrophy  Skin: warm and dry, no rash Neuro:  Strength and sensation are intact Psych: euthymic mood, full affect    Recent Labs: 11/01/2017: ALT 34; Magnesium 1.9; TSH 1.897 11/09/2017: BUN 16; Creatinine, Ser 1.17; Hemoglobin 16.4; Platelets 399.0; Potassium 4.3; Sodium 137   Lipid Panel    Component Value Date/Time   CHOL 141 04/12/2017 0833   TRIG 168.0 (H) 04/12/2017 0833   HDL 38.20 (L) 04/12/2017 0833   CHOLHDL 4 04/12/2017 0833   VLDL 33.6 04/12/2017 0833   LDLCALC 69 04/12/2017 0833   LDLDIRECT 62.0 09/07/2006 0920     Other studies  Reviewed: Additional studies/ records that were reviewed today with results demonstrating: hospital records reviewed.   ASSESSMENT AND PLAN:  1. Atrial flutter: Now in normal sinus rhythm.  Tolerating Eliquis.  No bleeding problems.  Mild orthostatic symptoms on diltiazem.  Encouraged him to stay well-hydrated and not to change positions quickly. 2. Morbid Obesity: We spoke about the importance of quality foods and avoiding excess sugar and processed foods.  He is visited with  a nutritionist.  He is going to try to increase exercise gradually.  We also spoke about trying to get him into cardiac rehab.  That is fine with me if he qualifies. 3. Anticoagulated: No bleeding problems.  Continue current anticoagulation.  Labs were done with his primary care doctor. 4. OSA: We will set him up for repeat sleep study.  He reports that he did not tolerate CPAP in the past.  He did tolerate nasal cannula oxygen in the hospital.  He thinks he could sleep with nasal cannula CPAP if that is an option for him.  We will repeat sleep study and see if that would work.  This is likely related to his atrial flutter.   Current medicines are reviewed at length with the patient today.  The patient concerns regarding his medicines were addressed.  The following changes have been made:  No change  Labs/ tests ordered today include:  No orders of the defined types were placed in this encounter.   Recommend 150 minutes/week of aerobic exercise Low fat, low carb, high fiber diet recommended  Disposition:   FU in 4 months   Signed, Larae Grooms, MD  11/11/2017 11:57 AM    [facility] [place] [address], [place], [place]  [address] Phone: [phone]; Fax: [phone]

## 2017-11-10 NOTE — Assessment & Plan Note (Signed)
Reduce losartan to 25mg  due to orthostatic symptoms. Continue cardizem. His hctz was stopped in hospital. Propranolol stopped in prior past due to concern of bronchospasm

## 2017-11-10 NOTE — Assessment & Plan Note (Signed)
He is trying to work through [place] as discussed with Inda Coke, [place] and also nutritionist- I encouraged him in this as well as to follow up.

## 2017-11-11 ENCOUNTER — Encounter: Payer: Self-pay | Admitting: Interventional Cardiology

## 2017-11-11 ENCOUNTER — Encounter (INDEPENDENT_AMBULATORY_CARE_PROVIDER_SITE_OTHER): Payer: Self-pay

## 2017-11-11 ENCOUNTER — Telehealth: Payer: Self-pay | Admitting: *Deleted

## 2017-11-11 ENCOUNTER — Ambulatory Visit: Payer: Medicare Other | Admitting: Interventional Cardiology

## 2017-11-11 VITALS — BP 132/64 | HR 87 | Ht 71.0 in | Wt 294.0 lb

## 2017-11-11 DIAGNOSIS — R42 Dizziness and giddiness: Secondary | ICD-10-CM

## 2017-11-11 DIAGNOSIS — I4892 Unspecified atrial flutter: Secondary | ICD-10-CM

## 2017-11-11 DIAGNOSIS — G4733 Obstructive sleep apnea (adult) (pediatric): Secondary | ICD-10-CM

## 2017-11-11 DIAGNOSIS — Z7901 Long term (current) use of anticoagulants: Secondary | ICD-10-CM

## 2017-11-11 NOTE — Telephone Encounter (Signed)
-----   Message from Cleon Gustin, [place] sent at 11/11/2017 12:28 PM EDT ----- Regarding: Sleep Study Patient needs a sleep study done. Patient has a Hx of OSA and has Atrial Flutter. ESS completed in Epic at [place] with Dr. Irish Lack today (ESS=9). Please pre-cert and schedule patient.  Thanks, [place], Therapist, sports

## 2017-11-11 NOTE — Patient Instructions (Addendum)
Medication Instructions:  Your physician recommends that you continue on your current medications as directed. Please refer to the Current Medication list given to you today.   Labwork: None ordered  Testing/Procedures: Your physician has recommended that you have a sleep study. This test records several body functions during sleep, including: brain activity, eye movement, oxygen and carbon dioxide blood levels, heart rate and rhythm, breathing rate and rhythm, the flow of air through your mouth and nose, snoring, body muscle movements, and chest and belly movement.  Follow-Up: You have been referred to Cardiac Rehab   Your physician recommends that you schedule a follow-up appointment in: 4 months with Dr. Irish Lack    Any Other Special Instructions Will Be Listed Below (If Applicable).     If you need a refill on your cardiac medications before your next appointment, please call your pharmacy.

## 2017-11-12 ENCOUNTER — Telehealth (HOSPITAL_COMMUNITY): Payer: Self-pay | Admitting: *Deleted

## 2017-11-12 NOTE — Telephone Encounter (Signed)
Called and left message for pt regarding cardiac rehab.  Pt does not have a qualifying diagnosis for phase II. Pt is eligible to participate in our maintenance self pay program.  Contact phone number provided. Cherre Huger, BSN Cardiac and Training and development officer

## 2017-11-12 NOTE — Telephone Encounter (Signed)
Returned pt call from previous message left.  Pt advised of the maintenance program self pay, non telemetry. Pt wanted to think about it as he already has a membership at [employer].  Pt will call us back if he wishes to proceed. Cherre Huger, BSN Cardiac and Training and development officer

## 2017-11-15 ENCOUNTER — Telehealth: Payer: Self-pay | Admitting: *Deleted

## 2017-11-15 NOTE — Telephone Encounter (Signed)
Patient returned a call to me and was informed of his 12/08/17 in lab sleep study appointment.

## 2017-11-15 NOTE — Telephone Encounter (Signed)
Left message to return a call. 

## 2017-11-23 ENCOUNTER — Ambulatory Visit: Payer: Medicare Other | Admitting: Family Medicine

## 2017-11-26 NOTE — Telephone Encounter (Signed)
Gave patient the number [phone])  to the sleep lab to call and reschedule his sleep test In a couple of months. Patient states he does want to test but not at this time.

## 2017-12-02 ENCOUNTER — Ambulatory Visit: Payer: Self-pay | Admitting: *Deleted

## 2017-12-02 ENCOUNTER — Ambulatory Visit (INDEPENDENT_AMBULATORY_CARE_PROVIDER_SITE_OTHER): Payer: Medicare Other | Admitting: Physician Assistant

## 2017-12-02 ENCOUNTER — Encounter: Payer: Self-pay | Admitting: Physician Assistant

## 2017-12-02 VITALS — BP 122/82 | HR 75 | Temp 97.7°F | Ht 71.0 in | Wt 292.2 lb

## 2017-12-02 DIAGNOSIS — M79672 Pain in left foot: Secondary | ICD-10-CM | POA: Diagnosis not present

## 2017-12-02 MED ORDER — DOXYCYCLINE HYCLATE 100 MG PO TABS: 100.0000 mg | ORAL_TABLET | Freq: Two times a day (BID) | ORAL | 0 refills | Status: DC

## 2017-12-02 MED ORDER — METHYLPREDNISOLONE 4 MG PO TBPK: ORAL_TABLET | ORAL | 0 refills | Status: DC

## 2017-12-02 NOTE — Patient Instructions (Signed)
Start the oral antibiotic and take the steroid. We will cover for infection and gout.   Contact a health care provider if:  You miss a dose of your blood thinner.  You have nausea, vomiting, or diarrhea that lasts for more than one day.  You have unusual bruising. Get help right away if:  You have new or increased pain, swelling, or redness in an arm or leg.  You have numbness or tingling in an arm or leg.  You have shortness of breath.  You have chest pain.  You have a rapid or irregular heartbeat.  You feel light-headed or dizzy.  You cough up blood.  There is blood in your vomit, stool, or urine.  You have a serious fall or accident, or you hit your head.  You have a severe headache or confusion.  You have a cut that will not stop bleeding. These symptoms may represent a serious problem that is an emergency. Do not wait to see if the symptoms will go away. Get medical help right away. Call your local emergency services (911 in the [place] Do not drive yourself to the hospital.  Contact a doctor if:  You have a fever.  Your symptoms do not get better after 1-2 days of treatment.  Your bone or joint under the infected area starts to hurt after the skin has healed.  Your infection comes back. This can happen in the same area or another area.  You have a swollen bump in the infected area.  You have new symptoms.  You feel ill and also have muscle aches and pains. Get help right away if:  Your symptoms get worse.  You feel very sleepy.  You throw up (vomit) or have watery poop (diarrhea) for a long time.  There are red streaks coming from the infected area.  Your red area gets larger.  Your red area turns darker.     This information is not intended to replace advice given to you by your health care provider. Make sure you discuss any questions you have with your health care provider.

## 2017-12-02 NOTE — Progress Notes (Signed)
Wayne Booth is a 77 y.o. male here for a new problem.  I acted as a Education administrator for [employer], PA-C [employer], LPN  History of Present Illness:   Chief Complaint  Patient presents with  . left foot pain    red, swollen and painful x 3-4 days    HPI  Left foot pain Pt c/o of left foot pain x 3 days, foot is red, warm and swollen, extending into calf.  Left foot pain 9/10 painful especially when walking and bearing weight on foot. No injury. Has not taken anything for pain. He states that most of his pain is in the base of his L great toe. He states that he has a prior history of gout, last flare was several years ago. Denies recent travel, chest pain, dizziness. He is taking his blood thinner as prescribed. Denies recent excess alcohol intake. Does endorse some shortness of breath.  Past Medical History:  Diagnosis Date  . Allergy    States his nose runs when he is around grease.   Marland Kitchen Anxiety   . Arthritis   . Cancer ([place])   . History of total knee replacement   . Hypertension   . Hypothyroidism   . Kidney cancer, primary, with metastasis from kidney to other site [facility])   . Kidney stone   . OSA (obstructive sleep apnea)    pt does not wear cpap at night  . Pituitary cyst ([place])   . Pneumonia    hx of  . Prostate cancer [facility])      Social History   Socioeconomic History  . Marital status: Married    Spouse name: [name]  . Number of children: Y  . Years of education: Not on file  . Highest education level: Not on file  Occupational History  . Occupation: retired Armed forces operational officer: RETIRED  Social Needs  . Financial resource strain: Not on file  . Food insecurity:    Worry: Not on file    Inability: Not on file  . Transportation needs:    Medical: Not on file    Non-medical: Not on file  Tobacco Use  . Smoking status: Former Smoker    Years: 4.00    Types: Cigars  . Smokeless tobacco: Never Used  . Tobacco comment: 1 cigar per  month, with golf, last one was 3-4 months.   Substance and Sexual Activity  . Alcohol use: Yes    Alcohol/week: 1.2 oz    Types: 2 Shots of liquor per week    Comment: socially  . Drug use: No  . Sexual activity: Not on file  Lifestyle  . Physical activity:    Days per week: Not on file    Minutes per session: Not on file  . Stress: Not on file  Relationships  . Social connections:    Talks on phone: Not on file    Gets together: Not on file    Attends religious service: Not on file    Active member of club or organization: Not on file    Attends meetings of clubs or organizations: Not on file    Relationship status: Not on file  . Intimate partner violence:    Fear of current or ex partner: Not on file    Emotionally abused: Not on file    Physically abused: Not on file    Forced sexual activity: Not on file  Other Topics Concern  . Not on  file  Social History Narrative   Married 1965 (wife patient of Dr. Megan Salon). 2 sons. 4 granddaughters.       Retired Chemical engineer.       Hobbies: golf, fishing, formerly hunting.     Past Surgical History:  Procedure Laterality Date  . ANTERIOR CERVICAL DECOMP/DISCECTOMY FUSION N/A 08/29/2012   Procedure: ANTERIOR CERVICAL DECOMPRESSION/DISCECTOMY FUSION 1 LEVEL;  Surgeon: Eustace Moore, MD;  Location: [place] [facility];  Service: Neurosurgery;  Laterality: N/A;  . APPENDECTOMY    . CARDIAC CATHETERIZATION  2008   clean  . CHOLECYSTECTOMY    . COLONOSCOPY W/ POLYPECTOMY    . CRANIOTOMY N/A 11/08/2012   Procedure: CRANIOTOMY HYPOPHYSECTOMY TRANSNASAL APPROACH;  Surgeon: Faythe Ghee, MD;  Location: [place] [facility];  Service: Neurosurgery;  Laterality: N/A;  Transphenoidal Resection of Pituitary Tumor  . FINGER SURGERY Left 2017   Dr Amedeo Plenty  . INSERTION PROSTATE RADIATION SEED    . KIDNEY STONE SURGERY    . KNEE ARTHROSCOPY  2007   left  . NEPHRECTOMY Left   . POSTERIOR CERVICAL LAMINECTOMY Left 04/24/2015   Procedure:  Foraminotomy cervical five - cervical six cervical six - cervical seven left;  Surgeon: Eustace Moore, MD;  Location: [facility];  Service: Neurosurgery;  Laterality: Left;  . REPLACEMENT TOTAL KNEE BILATERAL      Family History  Problem Relation Age of Onset  . Cancer Father        ?mouth, died before patient born  . Cancer Mother        stomach, died when he was young  . Kidney cancer Sister   . Colon cancer Brother 48  . Esophageal cancer Neg Hx   . Rectal cancer Neg Hx   . Stomach cancer Neg Hx     No Known Allergies  Current Medications:   Current Outpatient Medications:  .  albuterol (PROVENTIL HFA;VENTOLIN HFA) 108 (90 Base) MCG/ACT inhaler, Inhale 2 puffs into the lungs every 6 (six) hours as needed for wheezing or shortness of breath., Disp: 1 Inhaler, Rfl: 5 .  apixaban (ELIQUIS) 5 MG TABS tablet, Take 1 tablet (5 mg total) by mouth 2 (two) times daily., Disp: 60 tablet, Rfl: 1 .  clotrimazole-betamethasone (LOTRISONE) cream, Apply 1 application topically 2 (two) times daily. Use for 10 days maximum., Disp: 45 g, Rfl: 1 .  cyclobenzaprine (FLEXERIL) 10 MG tablet, Take 10 mg by mouth daily as needed for muscle pain., Disp: , Rfl:  .  diltiazem (CARDIZEM CD) 120 MG 24 hr capsule, Take 1 capsule (120 mg total) by mouth daily., Disp: 30 capsule, Rfl: 0 .  escitalopram (LEXAPRO) 20 MG tablet, Take 1 tablet (20 mg total) by mouth daily. TAKE 20 mg every day, Disp: 90 tablet, Rfl: 3 .  levothyroxine (SYNTHROID, LEVOTHROID) 150 MCG tablet, TAKE ONE TABLET BY MOUTH DAILY, Disp: 90 tablet, Rfl: 2 .  losartan (COZAAR) 25 MG tablet, Take 1 tablet (25 mg total) by mouth daily., Disp: 90 tablet, Rfl: 3 .  Multiple Vitamins-Minerals (MULTIVITAMINS THER. W/MINERALS) TABS, Take 1 tablet by mouth daily., Disp: , Rfl:  .  nortriptyline (PAMELOR) 75 MG capsule, TAKE ONE CAPSULE BY MOUTH AT BEDTIME, Disp: 90 capsule, Rfl: 1 .  traMADol (ULTRAM) 50 MG tablet, Take 50 mg by mouth daily as needed  for severe pain., Disp: , Rfl:  .  traZODone (DESYREL) 50 MG tablet, TAKE ONE HALF TO ONE TABLET BY MOUTH EVERY NIGHT AT BEDTIME AS NEEDED FOR SLEEP,  Disp: 90 tablet, Rfl: 2 .  doxycycline (VIBRA-TABS) 100 MG tablet, Take 1 tablet (100 mg total) by mouth 2 (two) times daily., Disp: 20 tablet, Rfl: 0 .  methylPREDNISolone (MEDROL DOSEPAK) 4 MG TBPK tablet, 6-5-4-3-2-1 off, Disp: 21 tablet, Rfl: 0   Review of Systems:   ROS  Negative unless otherwise specified per HPI.   Vitals:   Vitals:   12/02/17 1041  BP: 122/82  Pulse: 75  Temp: 97.7 F (36.5 C)  TempSrc: Oral  SpO2: 94%  Weight: 292 lb 3.2 oz (132.5 kg)  Height: 5\' 11"  (1.803 m)     Body mass index is 40.75 kg/m.  Physical Exam:   Physical Exam  Constitutional: He appears well-developed. He is cooperative.  Non-toxic appearance. He does not have a sickly appearance. He does not appear ill. No distress.  Cardiovascular: Normal rate, regular rhythm, S1 normal, S2 normal, normal heart sounds and normal pulses.  Pulses:      Dorsalis pedis pulses are 2+ on the right side, and 2+ on the left side.       Posterior tibial pulses are 2+ on the right side, and 2+ on the left side.  Pulmonary/Chest: Effort normal and breath sounds normal. No tachypnea and no bradypnea. No respiratory distress.  Musculoskeletal:  Point tenderness to L first MTP joint. Redness to area. Redness to L ankle and distal LLE. Trace edema to area. No point tenderness to calf, lower leg, or ankle joint.   Neurological: He is alert. GCS eye subscore is 4. GCS verbal subscore is 5. GCS motor subscore is 6.  Normal sensation to LLE.  Skin: Skin is warm, dry and intact.  Psychiatric: He has a normal mood and affect. His speech is normal and behavior is normal.  Nursing note and vitals reviewed.  Negative Homan's sign bilaterally.   Assessment and Plan:    Dade was seen today for left foot pain.  Diagnoses and all orders for this visit:  Left foot  pain  Other orders -     methylPREDNISolone (MEDROL DOSEPAK) 4 MG TBPK tablet; 6-5-4-3-2-1 off -     doxycycline (VIBRA-TABS) 100 MG tablet; Take 1 tablet (100 mg total) by mouth 2 (two) times daily.   Discussed case with patient's PCP, Dr. [name]. Suspect patient with cellulitis vs gout. Discussed that we should cover for both with medrol dosepack and doxycycline. Offered imaging, however patient declined. During nursing rooming, patient stated that he had Ultram at home but was not taking. Then when discussing with me he said he doesn't know what this medication is. Patient became upset. Discussed that Medrol dose pack should help with his pain and inflammation. Offered patient to be evaluated by Dr. Yong Channel, however he declined. Also provided list of return precautions -- also should he develop any calf pain, worsening swelling, fevers, chest pain or other concerning symptoms he should seek medical attention immediately. Patient verbalized understanding. Consider bloodwork if symptoms persist, or after acute gout flare.  . Reviewed expectations re: course of current medical issues. . Discussed self-management of symptoms. . Outlined signs and symptoms indicating need for more acute intervention. . Patient verbalized understanding and all questions were answered. . See orders for this visit as documented in the electronic medical record. . Patient received an After-Visit Summary.  CMA or LPN served as scribe during this visit. History, Physical, and Plan performed by medical provider. Documentation and orders reviewed and attested to.   Inda Coke, PA-C

## 2017-12-02 NOTE — Telephone Encounter (Signed)
  Pt reports left foot swollen "Moderate", top of foot red, warm to touch x 3 days. 9-10/10 pain with ambulation, "Less with rest." States most painful at left great toe. Does not recall any injury. Denies any calf pain, fever. Appt made for today with S. Worley. Care advise given per protocol. Reason for Disposition . [1] Redness of the skin AND [2] no fever  Answer Assessment - Initial Assessment Questions 1. ONSET: "When did the pain start?"      3-4 days ago 2. LOCATION: "Where is the pain located?"      Great toe left foot 3. PAIN: "How bad is the pain?"    (Scale 1-10; or mild, moderate, severe)   -  MILD (1-3): doesn't interfere with normal activities    -  MODERATE (4-7): interferes with normal activities (e.g., work or school) or awakens from sleep, limping    -  SEVERE (8-10): excruciating pain, unable to do any normal activities, unable to walk     9-10/walking, "Less with rest, elevation." 4. WORK OR EXERCISE: "Has there been any recent work or exercise that involved this part of the body?"      no 5. CAUSE: "What do you think is causing the foot pain?"     Maybe gout 6. OTHER SYMPTOMS: "Do you have any other symptoms?" (e.g., leg pain, rash, fever, numbness)     Swelling "moderate"  Protocols used: FOOT PAIN-A-AH

## 2017-12-03 ENCOUNTER — Emergency Department (HOSPITAL_COMMUNITY): Payer: Medicare Other

## 2017-12-03 ENCOUNTER — Ambulatory Visit: Payer: Self-pay

## 2017-12-03 ENCOUNTER — Encounter (HOSPITAL_COMMUNITY): Payer: Self-pay

## 2017-12-03 ENCOUNTER — Inpatient Hospital Stay (HOSPITAL_COMMUNITY)
Admission: EM | Admit: 2017-12-03 | Discharge: 2017-12-08 | DRG: 308 | Disposition: A | Discharge status: Home / Self Care | Payer: Medicare Other | Attending: Internal Medicine | Admitting: Internal Medicine

## 2017-12-03 ENCOUNTER — Other Ambulatory Visit: Payer: Self-pay

## 2017-12-03 DIAGNOSIS — M109 Gout, unspecified: Secondary | ICD-10-CM | POA: Diagnosis not present

## 2017-12-03 DIAGNOSIS — Z6841 Body Mass Index (BMI) 40.0 and over, adult: Secondary | ICD-10-CM | POA: Diagnosis not present

## 2017-12-03 DIAGNOSIS — I5023 Acute on chronic systolic (congestive) heart failure: Secondary | ICD-10-CM | POA: Diagnosis not present

## 2017-12-03 DIAGNOSIS — I5031 Acute diastolic (congestive) heart failure: Secondary | ICD-10-CM | POA: Diagnosis not present

## 2017-12-03 DIAGNOSIS — Z86010 Personal history of colonic polyps: Secondary | ICD-10-CM | POA: Diagnosis not present

## 2017-12-03 DIAGNOSIS — M549 Dorsalgia, unspecified: Secondary | ICD-10-CM | POA: Diagnosis present

## 2017-12-03 DIAGNOSIS — Z86711 Personal history of pulmonary embolism: Secondary | ICD-10-CM | POA: Diagnosis not present

## 2017-12-03 DIAGNOSIS — Z905 Acquired absence of kidney: Secondary | ICD-10-CM

## 2017-12-03 DIAGNOSIS — Z87442 Personal history of urinary calculi: Secondary | ICD-10-CM | POA: Diagnosis not present

## 2017-12-03 DIAGNOSIS — R0789 Other chest pain: Secondary | ICD-10-CM

## 2017-12-03 DIAGNOSIS — I4892 Unspecified atrial flutter: Secondary | ICD-10-CM

## 2017-12-03 DIAGNOSIS — R079 Chest pain, unspecified: Secondary | ICD-10-CM | POA: Diagnosis not present

## 2017-12-03 DIAGNOSIS — G8929 Other chronic pain: Secondary | ICD-10-CM | POA: Diagnosis present

## 2017-12-03 DIAGNOSIS — Z808 Family history of malignant neoplasm of other organs or systems: Secondary | ICD-10-CM

## 2017-12-03 DIAGNOSIS — E039 Hypothyroidism, unspecified: Secondary | ICD-10-CM | POA: Diagnosis not present

## 2017-12-03 DIAGNOSIS — I10 Essential (primary) hypertension: Secondary | ICD-10-CM | POA: Diagnosis present

## 2017-12-03 DIAGNOSIS — F411 Generalized anxiety disorder: Secondary | ICD-10-CM | POA: Diagnosis present

## 2017-12-03 DIAGNOSIS — R2681 Unsteadiness on feet: Secondary | ICD-10-CM | POA: Diagnosis not present

## 2017-12-03 DIAGNOSIS — Z87891 Personal history of nicotine dependence: Secondary | ICD-10-CM

## 2017-12-03 DIAGNOSIS — Z8546 Personal history of malignant neoplasm of prostate: Secondary | ICD-10-CM

## 2017-12-03 DIAGNOSIS — I4891 Unspecified atrial fibrillation: Secondary | ICD-10-CM | POA: Diagnosis not present

## 2017-12-03 DIAGNOSIS — Z85528 Personal history of other malignant neoplasm of kidney: Secondary | ICD-10-CM | POA: Diagnosis not present

## 2017-12-03 DIAGNOSIS — E875 Hyperkalemia: Secondary | ICD-10-CM | POA: Diagnosis present

## 2017-12-03 DIAGNOSIS — Z79890 Hormone replacement therapy (postmenopausal): Secondary | ICD-10-CM

## 2017-12-03 DIAGNOSIS — I2510 Atherosclerotic heart disease of native coronary artery without angina pectoris: Secondary | ICD-10-CM | POA: Diagnosis not present

## 2017-12-03 DIAGNOSIS — M10071 Idiopathic gout, right ankle and foot: Secondary | ICD-10-CM | POA: Diagnosis present

## 2017-12-03 DIAGNOSIS — Z86018 Personal history of other benign neoplasm: Secondary | ICD-10-CM | POA: Diagnosis not present

## 2017-12-03 DIAGNOSIS — Z79899 Other long term (current) drug therapy: Secondary | ICD-10-CM

## 2017-12-03 DIAGNOSIS — G4733 Obstructive sleep apnea (adult) (pediatric): Secondary | ICD-10-CM | POA: Diagnosis present

## 2017-12-03 DIAGNOSIS — R0902 Hypoxemia: Secondary | ICD-10-CM | POA: Diagnosis not present

## 2017-12-03 DIAGNOSIS — L03115 Cellulitis of right lower limb: Secondary | ICD-10-CM | POA: Diagnosis not present

## 2017-12-03 DIAGNOSIS — R0602 Shortness of breath: Secondary | ICD-10-CM | POA: Diagnosis not present

## 2017-12-03 DIAGNOSIS — Z9089 Acquired absence of other organs: Secondary | ICD-10-CM

## 2017-12-03 DIAGNOSIS — I110 Hypertensive heart disease with heart failure: Secondary | ICD-10-CM | POA: Diagnosis not present

## 2017-12-03 DIAGNOSIS — R000 Tachycardia, unspecified: Secondary | ICD-10-CM | POA: Diagnosis not present

## 2017-12-03 DIAGNOSIS — R221 Localized swelling, mass and lump, neck: Secondary | ICD-10-CM

## 2017-12-03 DIAGNOSIS — Z7951 Long term (current) use of inhaled steroids: Secondary | ICD-10-CM

## 2017-12-03 DIAGNOSIS — Z800 Family history of malignant neoplasm of digestive organs: Secondary | ICD-10-CM

## 2017-12-03 DIAGNOSIS — I5033 Acute on chronic diastolic (congestive) heart failure: Secondary | ICD-10-CM

## 2017-12-03 DIAGNOSIS — Z9049 Acquired absence of other specified parts of digestive tract: Secondary | ICD-10-CM

## 2017-12-03 DIAGNOSIS — I2782 Chronic pulmonary embolism: Secondary | ICD-10-CM | POA: Diagnosis present

## 2017-12-03 DIAGNOSIS — I480 Paroxysmal atrial fibrillation: Principal | ICD-10-CM | POA: Diagnosis present

## 2017-12-03 DIAGNOSIS — Z981 Arthrodesis status: Secondary | ICD-10-CM

## 2017-12-03 DIAGNOSIS — Z96653 Presence of artificial knee joint, bilateral: Secondary | ICD-10-CM | POA: Diagnosis present

## 2017-12-03 DIAGNOSIS — Z7901 Long term (current) use of anticoagulants: Secondary | ICD-10-CM

## 2017-12-03 DIAGNOSIS — Z8051 Family history of malignant neoplasm of kidney: Secondary | ICD-10-CM

## 2017-12-03 HISTORY — DX: Atherosclerotic heart disease of native coronary artery without angina pectoris: I25.10

## 2017-12-03 HISTORY — DX: Unspecified atrial fibrillation: I48.91

## 2017-12-03 LAB — BASIC METABOLIC PANEL
ANION GAP: 9 (ref 5–15)
BUN: 18 mg/dL (ref 6–20)
CALCIUM: 8.9 mg/dL (ref 8.9–10.3)
CO2: 22 mmol/L (ref 22–32)
Chloride: 103 mmol/L (ref 101–111)
Creatinine, Ser: 1.22 mg/dL (ref 0.61–1.24)
GFR, EST NON AFRICAN AMERICAN: 56 mL/min — AB (ref >60)
Glucose, Bld: 140 mg/dL — ABNORMAL HIGH (ref 65–99)
Potassium: 5.2 mmol/L — ABNORMAL HIGH (ref 3.5–5.1)
Sodium: 134 mmol/L — ABNORMAL LOW (ref 135–145)

## 2017-12-03 LAB — CBC
HCT: 46.7 % (ref 39.0–52.0)
Hemoglobin: 15.5 g/dL (ref 13.0–17.0)
MCH: 29.6 pg (ref 26.0–34.0)
MCHC: 33.2 g/dL (ref 30.0–36.0)
MCV: 89.3 fL (ref 78.0–100.0)
Platelets: 358 10*3/uL (ref 150–400)
RBC: 5.23 MIL/uL (ref 4.22–5.81)
RDW: 14.2 % (ref 11.5–15.5)
WBC: 19.2 10*3/uL — ABNORMAL HIGH (ref 4.0–10.5)

## 2017-12-03 LAB — I-STAT TROPONIN, ED: TROPONIN I, POC: 0 ng/mL (ref 0.00–0.08)

## 2017-12-03 LAB — TROPONIN I

## 2017-12-03 LAB — BRAIN NATRIURETIC PEPTIDE: B Natriuretic Peptide: 271.9 pg/mL — ABNORMAL HIGH (ref 0.0–100.0)

## 2017-12-03 MED ORDER — ZOLPIDEM TARTRATE 5 MG PO TABS
5.0000 mg | ORAL_TABLET | Freq: Every evening | ORAL | Status: DC | PRN
  Administered 2017-12-05: 5 mg via ORAL
  Filled 2017-12-03: qty 1

## 2017-12-03 MED ORDER — ADULT MULTIVITAMIN W/MINERALS CH
1.0000 | ORAL_TABLET | Freq: Every day | ORAL | Status: DC
  Administered 2017-12-04 – 2017-12-08 (×5): 1 via ORAL
  Filled 2017-12-03 (×5): qty 1

## 2017-12-03 MED ORDER — ACETAMINOPHEN 650 MG RE SUPP: 650.0000 mg | Freq: Four times a day (QID) | RECTAL | Status: DC | PRN

## 2017-12-03 MED ORDER — REGADENOSON 0.4 MG/5ML IV SOLN
0.4000 mg | Freq: Once | INTRAVENOUS | Status: DC
  Filled 2017-12-03: qty 5

## 2017-12-03 MED ORDER — LEVOTHYROXINE SODIUM 75 MCG PO TABS
150.0000 ug | ORAL_TABLET | Freq: Every day | ORAL | Status: DC
  Administered 2017-12-04 – 2017-12-08 (×5): 150 ug via ORAL
  Filled 2017-12-03 (×5): qty 2

## 2017-12-03 MED ORDER — PREDNISONE 20 MG PO TABS
40.0000 mg | ORAL_TABLET | Freq: Every day | ORAL | Status: DC
  Administered 2017-12-04 – 2017-12-06 (×3): 40 mg via ORAL
  Filled 2017-12-03 (×3): qty 2

## 2017-12-03 MED ORDER — ACETAMINOPHEN 325 MG PO TABS: 650.0000 mg | ORAL_TABLET | Freq: Four times a day (QID) | ORAL | Status: DC | PRN

## 2017-12-03 MED ORDER — DILTIAZEM HCL-DEXTROSE 100-5 MG/100ML-% IV SOLN (PREMIX)
5.0000 mg/h | INTRAVENOUS | Status: DC
  Administered 2017-12-03: 5 mg/h via INTRAVENOUS
  Administered 2017-12-04 – 2017-12-05 (×3): 10 mg/h via INTRAVENOUS
  Filled 2017-12-03 (×4): qty 100

## 2017-12-03 MED ORDER — DILTIAZEM HCL 25 MG/5ML IV SOLN: 20.0000 mg | Freq: Once | INTRAVENOUS | Status: DC

## 2017-12-03 MED ORDER — CYCLOBENZAPRINE HCL 10 MG PO TABS: 10.0000 mg | ORAL_TABLET | Freq: Every day | ORAL | Status: DC | PRN

## 2017-12-03 MED ORDER — ALBUTEROL SULFATE (2.5 MG/3ML) 0.083% IN NEBU: 2.5000 mg | INHALATION_SOLUTION | Freq: Four times a day (QID) | RESPIRATORY_TRACT | Status: DC | PRN

## 2017-12-03 MED ORDER — NITROGLYCERIN 0.4 MG SL SUBL: 0.4000 mg | SUBLINGUAL_TABLET | SUBLINGUAL | Status: DC | PRN

## 2017-12-03 MED ORDER — NORTRIPTYLINE HCL 25 MG PO CAPS
75.0000 mg | ORAL_CAPSULE | Freq: Every day | ORAL | Status: DC
Start: 2017-12-03 — End: 2017-12-08
  Administered 2017-12-03 – 2017-12-07 (×5): 75 mg via ORAL
  Filled 2017-12-03 (×6): qty 3

## 2017-12-03 MED ORDER — TRAZODONE HCL 50 MG PO TABS
50.0000 mg | ORAL_TABLET | Freq: Every day | ORAL | Status: DC
  Administered 2017-12-03 – 2017-12-07 (×5): 50 mg via ORAL
  Filled 2017-12-03 (×5): qty 1

## 2017-12-03 MED ORDER — FUROSEMIDE 10 MG/ML IJ SOLN
20.0000 mg | Freq: Once | INTRAMUSCULAR | Status: AC
  Administered 2017-12-03: 20 mg via INTRAVENOUS
  Filled 2017-12-03: qty 2

## 2017-12-03 MED ORDER — ONDANSETRON HCL 4 MG PO TABS: 4.0000 mg | ORAL_TABLET | Freq: Four times a day (QID) | ORAL | Status: DC | PRN

## 2017-12-03 MED ORDER — ONDANSETRON HCL 4 MG/2ML IJ SOLN
4.0000 mg | Freq: Four times a day (QID) | INTRAMUSCULAR | Status: DC | PRN
Start: 2017-12-03 — End: 2017-12-08

## 2017-12-03 MED ORDER — DILTIAZEM HCL 100 MG IV SOLR
INTRAVENOUS | Status: AC
  Filled 2017-12-03: qty 100

## 2017-12-03 MED ORDER — ESCITALOPRAM OXALATE 20 MG PO TABS
20.0000 mg | ORAL_TABLET | Freq: Every day | ORAL | Status: DC
  Administered 2017-12-03 – 2017-12-08 (×6): 20 mg via ORAL
  Filled 2017-12-03 (×6): qty 1

## 2017-12-03 MED ORDER — APIXABAN 5 MG PO TABS
5.0000 mg | ORAL_TABLET | Freq: Two times a day (BID) | ORAL | Status: DC
  Administered 2017-12-03 – 2017-12-08 (×10): 5 mg via ORAL
  Filled 2017-12-03 (×10): qty 1

## 2017-12-03 MED ORDER — DOXYCYCLINE HYCLATE 100 MG PO TABS
100.0000 mg | ORAL_TABLET | Freq: Two times a day (BID) | ORAL | Status: DC
  Administered 2017-12-03 – 2017-12-08 (×10): 100 mg via ORAL
  Filled 2017-12-03 (×10): qty 1

## 2017-12-03 MED ORDER — DILTIAZEM LOAD VIA INFUSION
20.0000 mg | Freq: Once | INTRAVENOUS | Status: AC
  Administered 2017-12-03: 20 mg via INTRAVENOUS
  Filled 2017-12-03: qty 20

## 2017-12-03 NOTE — ED Notes (Signed)
Admitting at bedside.  Heart Healthy meal tray ordered.

## 2017-12-03 NOTE — Consult Note (Signed)
Cardiology Consultation:   Patient ID: Wayne Booth; [id number]; 06/06/1941   Admit date: 12/03/2017 Date of Consult: 12/03/2017  Primary Care Provider: Marin Olp, MD Primary Cardiologist: Dr. Irish Lack  Patient Profile:   Wayne Booth is a 77 y.o. 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 Clementeen Graham .  History of Present Illness:   [name] is a 77 year old male with a history stated above who presented to [facility] on 12/03/2017 with complaints of chest pain and palpitations with associated fatigue, lightheadedness and diaphoresis x 1 day.  He reports his chest pain is aggravated by exertion and not relieved with rest.  Given his symptoms patient called EMS for transport to the emergency department for further evaluation.  Upon transport, patient was given SL NTG with improvement in chest pain.  Cardiology consulted with concern for Af With RVR, dizziness, and increasing dyspnea.  He also had severe chest pain during this episode of atrial fibrillation.  In the ED, EKG revealed NSR with no acute ischemic changes. I-STAT troponin negative.  Chest x-ray with possible left pleural effusion and bibasilar atelectasis.  CBC revealed leukocytosis ( but he was started on steroids yesterday for a gout attack ) .  Patient was recently admitted 11/03/2017 for atrial flutter and started on anticoagulation and Cardizem 120 mg CD.  Echocardiogram revealed he was additionally seen at his PCP office for left foot pain, erythema and started on day doxycycline with a Medrol dose pack with improvement in symptoms.  Patient had a cardiac catheterization in 2008 which revealed normal coronaries.  Has been avoiding salt since last  hospitalization Eats bacon and sausage occasionally  Had severe CP today in the setting of severe dyspea and tachycardia  No fever, no cough, no diaphoresis,  Needs to go for OSS eval.  Has not done that yet  As of 630, p.m., he had converted back to normal sinus rhythm.  He was feeling quite a bit better but was anxious about getting up and moving around.  He apparently does not tolerate the atrial fibrillation well at all.  He developed severe shortness of breath and chest discomfort with the atrial fibrillation.   Past Medical History:  Diagnosis Date  . Allergy    States his nose runs when he is around grease.   Marland Kitchen Anxiety   . Arthritis   . Atrial fibrillation ([place])   . Cancer ([place])   . Coronary artery disease   . History of total knee replacement   . Hypertension   . Hypothyroidism   . Kidney cancer, primary, with metastasis from kidney to other site [facility])   . Kidney stone   . OSA (obstructive sleep apnea)    pt does not wear cpap at night  . Pituitary cyst ([place])   . Pneumonia    hx of  . Prostate cancer [facility])     Past Surgical History:  Procedure Laterality Date  . ANTERIOR CERVICAL DECOMP/DISCECTOMY FUSION N/A 08/29/2012   Procedure: ANTERIOR CERVICAL DECOMPRESSION/DISCECTOMY FUSION 1 LEVEL;  Surgeon: Eustace Moore, MD;  Location: [place] NEURO ORS;  Service: Neurosurgery;  Laterality: N/A;  . APPENDECTOMY    . CARDIAC CATHETERIZATION  2008   clean  . CHOLECYSTECTOMY    .  COLONOSCOPY W/ POLYPECTOMY    . CRANIOTOMY N/A 11/08/2012   Procedure: CRANIOTOMY HYPOPHYSECTOMY TRANSNASAL APPROACH;  Surgeon: Faythe Ghee, MD;  Location: [place] NEURO ORS;  Service: Neurosurgery;  Laterality: N/A;  Transphenoidal Resection of Pituitary Tumor  . FINGER SURGERY Left 2017   Dr Amedeo Plenty  . INSERTION PROSTATE RADIATION SEED    . KIDNEY STONE SURGERY    . KNEE ARTHROSCOPY  2007   left  . NEPHRECTOMY Left   . POSTERIOR CERVICAL LAMINECTOMY Left 04/24/2015   Procedure: Foraminotomy cervical  five - cervical six cervical six - cervical seven left;  Surgeon: Eustace Moore, MD;  Location: [facility] NEUR[facility]S;  Service: Neurosurgery;  Laterality: Left;  . REPLACEMENT TOTAL KNEE BILATERAL       Prior to Admission medications   Medication Sig Start Date End Date Taking? Authorizing Provider  albuterol (PROVENTIL HFA;VENTOLIN HFA) 108 (90 Base) MCG/ACT inhaler Inhale 2 puffs into the lungs every 6 (six) hours as needed for wheezing or shortness of breath. 08/11/17   Rigoberto Noel, MD  apixaban (ELIQUIS) 5 MG TABS tablet Take 1 tablet (5 mg total) by mouth 2 (two) times daily. 11/03/17   Georgette Shell, MD  clotrimazole-betamethasone (LOTRISONE) cream Apply 1 application topically 2 (two) times daily. Use for 10 days maximum. 08/27/16   Marin Olp, MD  cyclobenzaprine (FLEXERIL) 10 MG tablet Take 10 mg by mouth daily as needed for muscle pain. 09/30/17   [provider]  diltiazem (CARDIZEM CD) 120 MG 24 hr capsule Take 1 capsule (120 mg total) by mouth daily. 11/04/17   Georgette Shell, MD  doxycycline (VIBRA-TABS) 100 MG tablet Take 1 tablet (100 mg total) by mouth 2 (two) times daily. 12/02/17   Inda Coke, PA  escitalopram (LEXAPRO) 20 MG tablet Take 1 tablet (20 mg total) by mouth daily. TAKE 20 mg every day 08/12/16   Marin Olp, MD  levothyroxine (SYNTHROID, LEVOTHROID) 150 MCG tablet TAKE ONE TABLET BY MOUTH DAILY 07/06/17   Marin Olp, MD  losartan (COZAAR) 25 MG tablet Take 1 tablet (25 mg total) by mouth daily. 11/09/17   Marin Olp, MD  methylPREDNISolone (MEDROL DOSEPAK) 4 MG TBPK tablet 6-5-4-3-2-1 off 12/02/17   Inda Coke, PA  Multiple Vitamins-Minerals (MULTIVITAMINS THER. W/MINERALS) TABS Take 1 tablet by mouth daily.    [provider]  nortriptyline (PAMELOR) 75 MG capsule TAKE ONE CAPSULE BY MOUTH AT BEDTIME 06/09/17   Marin Olp, MD  traMADol (ULTRAM) 50 MG tablet Take 50 mg by mouth daily as needed for severe pain.  09/30/17   [provider]  traZODone (DESYREL) 50 MG tablet TAKE ONE HALF TO ONE TABLET BY MOUTH EVERY NIGHT AT BEDTIME AS NEEDED FOR SLEEP 06/09/17   Marin Olp, MD    Inpatient Medications: Scheduled Meds: . diltiazem  20 mg Intravenous Once  . furosemide  20 mg Intravenous Once   Continuous Infusions: . diltiazem (CARDIZEM) infusion    . diltiazem (CARDIZEM) infusion 10 mg/hr (12/03/17 1655)   PRN Meds:   Allergies:   No Known Allergies  Social History:   Social History   Socioeconomic History  . Marital status: Married    Spouse name: [name]  . Number of children: Y  . Years of education: Not on file  . Highest education level: Not on file  Occupational History  . Occupation: retired Armed forces operational officer: RETIRED  Social Needs  . Financial resource strain:  Not on file  . Food insecurity:    Worry: Not on file    Inability: Not on file  . Transportation needs:    Medical: Not on file    Non-medical: Not on file  Tobacco Use  . Smoking status: Former Smoker    Years: 4.00    Types: Cigars  . Smokeless tobacco: Never Used  . Tobacco comment: 1 cigar per month, with golf, last one was 3-4 months.   Substance and Sexual Activity  . Alcohol use: Yes    Alcohol/week: 1.2 oz    Types: 2 Shots of liquor per week    Comment: socially  . Drug use: No  . Sexual activity: Not on file  Lifestyle  . Physical activity:    Days per week: Not on file    Minutes per session: Not on file  . Stress: Not on file  Relationships  . Social connections:    Talks on phone: Not on file    Gets together: Not on file    Attends religious service: Not on file    Active member of club or organization: Not on file    Attends meetings of clubs or organizations: Not on file    Relationship status: Not on file  . Intimate partner violence:    Fear of current or ex partner: Not on file    Emotionally abused: Not on file    Physically abused: Not on  file    Forced sexual activity: Not on file  Other Topics Concern  . Not on file  Social History Narrative   Married 1965 (wife patient of Dr. Megan Salon). 2 sons. 4 granddaughters.       Retired Chemical engineer.       Hobbies: golf, fishing, formerly hunting.     Family History:   Family History  Problem Relation Age of Onset  . Cancer Father        [place], died before patient born  . Cancer Mother        stomach, died when he was young  . Kidney cancer Sister   . Colon cancer Brother 37  . Esophageal cancer Neg Hx   . Rectal cancer Neg Hx   . Stomach cancer Neg Hx    Family Status:  Family Status  Relation Name Status  . Father  Deceased  . Mother  Deceased  . Sister  (Not Specified)  . Brother  (Not Specified)  . MGM  Deceased  . MGF  Deceased  . PGM  Deceased  . PGF  Deceased  . Neg Hx  (Not Specified)    ROS:  Please see the history of present illness.  All other ROS reviewed and negative.     Physical Exam/Data:   Vitals:   12/03/17 1700 12/03/17 1715 12/03/17 1730 12/03/17 1745  BP: 105/75 132/77 (!) 146/75 137/75  Pulse: (!) 144 94 95 93  Resp: (!) 22 (!) 26 16 (!) 21  Temp:      TempSrc:      SpO2: 95% 96% 96% 97%  Weight:      Height:       No intake or output data in the 24 hours ending 12/03/17 1757 Filed Weights   12/03/17 1527  Weight: 292 lb (132.5 kg)   Body mass index is 40.73 kg/m.  General: Morbidly obese elderly gentleman.  Ruddy  complexion HEENT: normal Lymph: no adenopathy Neck: Thick neck..  Difficult to assess JVD. Endocrine:  No thryomegaly  Vascular: No carotid bruits; 4/4 extremity pulses 2+, without bruits  Cardiac: Regular rate S1-S2.  Soft systolic murmur. Lungs: Few rhonchi and wheezes bilaterally.  Distant lung sounds because of his obesity. Abd: Morbidly obese, no ascites that I can tell.  Good bowel sounds.  Nontender Ext: No significant edema.  Left ankle is mildly tender due to gout. Musculoskeletal:  No  deformities, BUE and BLE strength normal and equal Skin: warm and dry  Neuro:  CNs 2-12 intact, no focal abnormalities noted Psych:  Normal affect   EKG:  The EKG was personally reviewed and demonstrates: Atrial fibrillation with rapid ventricular response.  Follow-up EKG in the emergency room reveals normal sinus rhythm. Telemetry:  Telemetry was personally reviewed and demonstrates: Normal sinus rhythm  Relevant CV Studies:  ECHO:  CATH:   Laboratory Data:  Chemistry Recent Labs  Lab 12/03/17 1539  NA 134*  K 5.2*  CL 103  CO2 22  GLUCOSE 140*  BUN 18  CREATININE 1.22  CALCIUM 8.9  GFRNONAA 56*  GFRAA >60  ANIONGAP 9    Total Protein  Date Value Ref Range Status  11/01/2017 6.9 6.5 - 8.1 g/dL Final   Albumin  Date Value Ref Range Status  11/01/2017 3.3 (L) 3.5 - 5.0 g/dL Final   AST  Date Value Ref Range Status  11/01/2017 27 15 - 41 U/L Final   ALT  Date Value Ref Range Status  11/01/2017 34 17 - 63 U/L Final   Alkaline Phosphatase  Date Value Ref Range Status  11/01/2017 69 38 - 126 U/L Final   Total Bilirubin  Date Value Ref Range Status  11/01/2017 0.7 0.3 - 1.2 mg/dL Final   Hematology Recent Labs  Lab 12/03/17 1539  WBC 19.2*  RBC 5.23  HGB 15.5  HCT 46.7  MCV 89.3  MCH 29.6  MCHC 33.2  RDW 14.2  PLT 358   Cardiac EnzymesNo results for input(s): TROPONINI in the last 168 hours.  Recent Labs  Lab 12/03/17 1546  TROPIPOC 0.00    BNP Recent Labs  Lab 12/03/17 1539  BNP 271.9*    DDimer No results for input(s): DDIMER in the last 168 hours. TSH:  Lab Results  Component Value Date   TSH 1.897 11/01/2017   Lipids: Lab Results  Component Value Date   CHOL 141 04/12/2017   HDL 38.20 (L) 04/12/2017   LDLCALC 69 04/12/2017   LDLDIRECT 62.0 09/07/2006   TRIG 168.0 (H) 04/12/2017   CHOLHDL 4 04/12/2017   HgbA1c: Lab Results  Component Value Date   HGBA1C 6.1 04/12/2017    Radiology/Studies:  Dg Chest Portable 1  View  Result Date: 12/03/2017 CLINICAL DATA:  Tachycardia and chest pain. EXAM: PORTABLE CHEST 1 VIEW COMPARISON:  11/01/2017 FINDINGS: Mild cardiomegaly. Aortic atherosclerosis. Poor inspiration. Question small left pleural effusion with basilar atelectasis. IMPRESSION: Question small left effusion with basilar atelectasis. Electronically Signed   By: Nelson Chimes M.D.   On: 12/03/2017 15:57    Assessment and Plan:   1. Principal Problem:   Atrial fibrillation with RVR (HCC) Active Problems:   Morbid obesity ([place])   Essential hypertension   OSA (obstructive sleep apnea)   Hypothyroidism   History of pulmonary embolism   Hyperkalemia   Acute gouty arthritis   1.  Atrial fibrillation with rapid ventricular response: The patient does not tolerate atrial fibrillation very well.  I suspect this is because of his diastolic dysfunction and morbid obesity.  It appears that he  decompensates fairly quickly.  Today he had an episode of atrial fibrillation that woke him up early in the morning.  He had severe shortness of breath and it was associated with severe chest pain.  He is on Eliquis. He may need to be started on an antiarrhythmic since he tolerates these episodes so poorly .   EP  input may be useful. He may be a candidate for amiodarone .  2.  Chest discomfort: He had significant chest discomfort while in rapid atrial fibrillation.  Cardiac enzymes have been ordered.  Initial troponin level is negative. We will schedule him for a 2-day Myoview study.  If his troponin levels are abnormal he will probably need a heart catheterization.  2.  Acute on chronic diastolic congestive heart failure: He acutely decompensated when he developed rapid atrial fibrillation.  Continue Lasix.  We will try to prevent recurrent atrial fibrillation and continue treatment of his diastolic heart failure.  4.  Possible obstructive sleep apnea.  The patient is morbidly obese and needs to be evaluated for  obstructive sleep apnea.  5.  Morbid obesity: The patient's BMI is greater than 40.  He is lost 8 pounds since his previous hospitalization.  I encouraged him to continue with his weight loss efforts.

## 2017-12-03 NOTE — ED Provider Notes (Signed)
[facility] EMERGENCY DEPARTMENT Provider Note   CSN: [id number] Arrival date & time: 12/03/17  1509   History   Chief Complaint Chief Complaint  Patient presents with  . Tachycardia  . Chest Pain    HPI Wayne Booth is a 77 y.o. male.  H/o atrial flutter, recent admission for cardizem infusion. Now anticoagulated with eliquis. Was seen yesterday for left foot pain, erythema, started on doxycycline and medrol dose pack with improvement in symptoms. No fever.   The history is provided by the patient.  Illness  This is a new problem. The current episode started 6 to 12 hours ago. The problem occurs constantly. The problem has not changed since onset.Associated symptoms include chest pain and shortness of breath. Pertinent negatives include no abdominal pain. Associated symptoms comments: Palpitations, Fatigue, lightheadedness, diaphoresis. The symptoms are aggravated by exertion. Nothing relieves the symptoms. Treatments tried: given NTG by EMS with improvement in chest pain. The treatment provided moderate relief.    Past Medical History:  Diagnosis Date  . Allergy    States his nose runs when he is around grease.   Marland Kitchen Anxiety   . Arthritis   . Atrial fibrillation ([place])   . Cancer ([place])   . Coronary artery disease   . History of total knee replacement   . Hypertension   . Hypothyroidism   . Kidney cancer, primary, with metastasis from kidney to other site [facility])   . Kidney stone   . OSA (obstructive sleep apnea)    pt does not wear cpap at night  . Pituitary cyst ([place])   . Pneumonia    hx of  . Prostate cancer [facility])     Patient Active Problem List   Diagnosis Date Noted  . Atrial fibrillation with RVR ([place]) 12/03/2017  . Hyperkalemia 12/03/2017  . Acute gouty arthritis 12/03/2017  . Anticoagulated   . Atrial flutter ([place]) 11/01/2017  . Bilateral foot pain 07/13/2017  . Community acquired pneumonia 06/18/2017  . Shortness of breath 06/14/2017    . History of adenomatous polyp of colon 04/12/2017  . History of pulmonary embolism 10/23/2016  . Cervical disc disease 11/13/2015  . Essential tremor 08/14/2015  . Gout 08/14/2015  . Hypothyroidism 07/12/2014  . Hyperglycemia 07/12/2014  . GAD (generalized anxiety disorder) 11/07/2013  . OSA (obstructive sleep apnea) 07/07/2011  . Asthmatic bronchitis 06/09/2011  . Actinic keratosis 11/27/2009  . Osteoarthritis 11/13/2008  . 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 Morbid obesity ([place]) 10/02/2008  . History of renal cell carcinoma 12/26/2007  . TESTOSTERONE DEFICIENCY 12/28/2006  . Essential hypertension 12/28/2006    Past Surgical History:  Procedure Laterality Date  . ANTERIOR CERVICAL DECOMP/DISCECTOMY FUSION N/A 08/29/2012   Procedure: ANTERIOR CERVICAL DECOMPRESSION/DISCECTOMY FUSION 1 LEVEL;  Surgeon: Eustace Moore, MD;  Location: [place] [facility] NEURO ORS;  Service: Neurosurgery;  Laterality: N/A;  . APPENDECTOMY    . CARDIAC CATHETERIZATION  2008   clean  . CHOLECYSTECTOMY    . COLONOSCOPY W/ POLYPECTOMY    . CRANIOTOMY N/A 11/08/2012   Procedure: CRANIOTOMY HYPOPHYSECTOMY TRANSNASAL APPROACH;  Surgeon: Faythe Ghee, MD;  Location: [place] [facility]UR[facility] ORS;  Service: Neurosurgery;  Laterality: N/A;  Transphenoidal Resection of Pituitary Tumor  . FINGER SURGERY Left 2017   Dr Amedeo Plenty  . INSERTION PROSTATE RADIATION SEED    . KIDNEY STONE SURGERY    . KNEE ARTHROSCOPY  2007   left  . NEPHRECTOMY Left   . POSTERIOR CERVICAL LAMINECTOMY Left 04/24/2015   Procedure:  Foraminotomy cervical five - cervical six cervical six - cervical seven left;  Surgeon: Eustace Moore, MD;  Location: [facility] NEURO ORS;  Service: Neurosurgery;  Laterality: Left;  . REPLACEMENT TOTAL KNEE BILATERAL          Home Medications    Prior to Admission medications   Medication Sig Start Date End Date Taking? Authorizing Provider  albuterol (PROVENTIL HFA;VENTOLIN HFA) 108 (90 Base) MCG/ACT inhaler Inhale 2 puffs into the  lungs every 6 (six) hours as needed for wheezing or shortness of breath. 08/11/17  Yes Rigoberto Noel, MD  apixaban (ELIQUIS) 5 MG TABS tablet Take 1 tablet (5 mg total) by mouth 2 (two) times daily. 11/03/17  Yes Georgette Shell, MD  budesonide-formoterol [facility]) 160-4.5 MCG/ACT inhaler Inhale 2 puffs into the lungs 2 (two) times daily as needed (for respiratory flares).   Yes [provider]  clotrimazole-betamethasone (LOTRISONE) cream Apply 1 application topically 2 (two) times daily. Use for 10 days maximum. Patient taking differently: Apply 1 application topically 2 (two) times daily as needed (for rashes).  08/27/16  Yes Marin Olp, MD  cyclobenzaprine (FLEXERIL) 10 MG tablet Take 10 mg by mouth daily as needed for muscle spasms.  09/30/17  Yes [provider]  diltiazem (CARDIZEM CD) 120 MG 24 hr capsule Take 1 capsule (120 mg total) by mouth daily. 11/04/17  Yes Georgette Shell, MD  doxycycline (VIBRA-TABS) 100 MG tablet Take 1 tablet (100 mg total) by mouth 2 (two) times daily. 12/02/17  Yes Inda Coke, PA  escitalopram (LEXAPRO) 20 MG tablet Take 1 tablet (20 mg total) by mouth daily. TAKE 20 mg every day Patient taking differently: Take 20 mg by mouth daily.  08/12/16  Yes Marin Olp, MD  levothyroxine (SYNTHROID, LEVOTHROID) 150 MCG tablet TAKE ONE TABLET BY MOUTH DAILY 07/06/17  Yes Marin Olp, MD  losartan (COZAAR) 25 MG tablet Take 1 tablet (25 mg total) by mouth daily. Patient taking differently: Take 25 mg by mouth daily with supper.  11/09/17  Yes Marin Olp, MD  methylPREDNISolone (MEDROL DOSEPAK) 4 MG TBPK tablet 6-5-4-3-2-1 off 12/02/17  Yes Morene Rankins, [place], PA  Multiple Vitamins-Minerals (MULTIVITAMINS THER. W/MINERALS) TABS Take 1 tablet by mouth daily.   Yes [provider]  nortriptyline (PAMELOR) 75 MG capsule TAKE ONE CAPSULE BY MOUTH AT BEDTIME 06/09/17  Yes Marin Olp, MD  traZODone (DESYREL) 50 MG tablet  TAKE ONE HALF TO ONE TABLET BY MOUTH EVERY NIGHT AT BEDTIME AS NEEDED FOR SLEEP Patient taking differently: Take 50 mg by mouth at bedtime.  06/09/17  Yes Marin Olp, MD    Family History Family History  Problem Relation Age of Onset  . Cancer Father        ?mouth, died before patient born  . Cancer Mother        stomach, died when he was young  . Kidney cancer Sister   . Colon cancer Brother 66  . Esophageal cancer Neg Hx   . Rectal cancer Neg Hx   . Stomach cancer Neg Hx     Social History Social History   Tobacco Use  . Smoking status: Former Smoker    Years: 4.00    Types: Cigars  . Smokeless tobacco: Never Used  . Tobacco comment: 1 cigar per month, with golf, last one was 3-4 months.   Substance Use Topics  . Alcohol use: Yes    Alcohol/week: 1.2 oz    Types:  2 Shots of liquor per week    Comment: socially  . Drug use: No     Allergies   Patient has no known allergies.   Review of Systems Review of Systems  Constitutional: Positive for diaphoresis and fatigue. Negative for chills and fever.  HENT: Negative for ear pain and sore throat.   Eyes: Negative for pain and visual disturbance.  Respiratory: Positive for shortness of breath. Negative for cough.   Cardiovascular: Positive for chest pain, palpitations and leg swelling.  Gastrointestinal: Negative for abdominal pain and vomiting.  Genitourinary: Negative for dysuria and hematuria.  Musculoskeletal: Negative for arthralgias and back pain.  Skin: Negative for color change and rash.  Neurological: Negative for seizures and syncope.  All other systems reviewed and are negative.    Physical Exam Updated Vital Signs BP 113/68 (BP Location: Right Arm)   Pulse 87   Temp 97.9 F (36.6 C) (Oral)   Resp (!) 22   Ht 5\' 11"  (1.803 m)   Wt 131.8 kg (290 lb 8 oz)   SpO2 97%   BMI 40.52 kg/m   Physical Exam  Constitutional: He is oriented to person, place, and time. He appears well-developed and  well-nourished.  HENT:  Head: Normocephalic and atraumatic.  Mouth/Throat: Oropharynx is clear and moist.   in place  Eyes: Conjunctivae and EOM are normal.  Neck: Neck supple.  Cardiovascular: Intact distal pulses. A regularly irregular rhythm present. Tachycardia present.  No murmur heard. Pulmonary/Chest: Effort normal. Tachypnea noted. No respiratory distress. He has decreased breath sounds.  Abdominal: Soft. He exhibits no distension. There is no tenderness. There is no rebound and no guarding.  Musculoskeletal: Normal range of motion. He exhibits edema (mild symmetric pitting edema to BLE).       Left lower leg: He exhibits tenderness (ttp over dorsum of left foot).  Neurological: He is alert and oriented to person, place, and time.  Skin: Skin is warm and dry. There is erythema (minimal to dorsum of left foot).  Psychiatric: He has a normal mood and affect.  Nursing note and vitals reviewed.    ED Treatments / Results  Labs (all labs ordered are listed, but only abnormal results are displayed) Labs Reviewed  BASIC METABOLIC PANEL - Abnormal; Notable for the following components:      Result Value   Sodium 134 (*)    Potassium 5.2 (*)    Glucose, Bld 140 (*)    GFR calc non Af Amer 56 (*)    All other components within normal limits  CBC - Abnormal; Notable for the following components:   WBC 19.2 (*)    All other components within normal limits  BRAIN NATRIURETIC PEPTIDE - Abnormal; Notable for the following components:   B Natriuretic Peptide 271.9 (*)    All other components within normal limits  TROPONIN I  BASIC METABOLIC PANEL  TROPONIN I  TROPONIN I  I-STAT TROPONIN, ED    EKG EKG Interpretation  Date/Time:  Friday December 03 2017 15:18:26 EDT Ventricular Rate:  149 PR Interval:    QRS Duration: 98 QT Interval:  278 QTC Calculation: 438 R Axis:   -24 Text Interpretation:  A-flutter with rapid V-rate Borderline left axis deviation Low voltage,  precordial leads Anteroseptal infarct, old Repolarization abnormality, prob rate related No STEMI.  Confirmed by Nanda Quinton [phone]) on 12/03/2017 3:32:18 PM   Radiology Dg Chest Portable 1 View  Result Date: 12/03/2017 CLINICAL DATA:  Tachycardia and chest pain. EXAM:  PORTABLE CHEST 1 VIEW COMPARISON:  11/01/2017 FINDINGS: Mild cardiomegaly. Aortic atherosclerosis. Poor inspiration. Question small left pleural effusion with basilar atelectasis. IMPRESSION: Question small left effusion with basilar atelectasis. Electronically Signed   By: Nelson Chimes M.D.   On: 12/03/2017 15:57    Procedures Procedures (including critical care time)  Medications Ordered in ED Medications  diltiazem (CARDIZEM) 1 mg/mL load via infusion 20 mg (20 mg Intravenous Bolus from Bag 12/03/17 1555)    And  diltiazem (CARDIZEM) 100 mg in dextrose 5% 169mL (1 mg/mL) infusion (10 mg/hr Intravenous Transfusing/Transfer 12/03/17 1916)  dextrose 5 % with diltiazem (CARDIZEM) ADS Med (5 mg/hr  Not Given 12/03/17 1553)  ondansetron (ZOFRAN) tablet 4 mg (has no administration in time range)    Or  ondansetron (ZOFRAN) injection 4 mg (has no administration in time range)  acetaminophen (TYLENOL) tablet 650 mg (has no administration in time range)    Or  acetaminophen (TYLENOL) suppository 650 mg (has no administration in time range)  nitroGLYCERIN (NITROSTAT) SL tablet 0.4 mg (has no administration in time range)  albuterol (PROVENTIL) (2.5 MG/3ML) 0.083% nebulizer solution 2.5 mg (has no administration in time range)  apixaban (ELIQUIS) tablet 5 mg (5 mg Oral Given 12/03/17 2117)  cyclobenzaprine (FLEXERIL) tablet 10 mg (has no administration in time range)  doxycycline (VIBRA-TABS) tablet 100 mg (100 mg Oral Given 12/03/17 2117)  escitalopram (LEXAPRO) tablet 20 mg (20 mg Oral Given 12/03/17 2122)  levothyroxine (SYNTHROID, LEVOTHROID) tablet 150 mcg (has no administration in time range)  multivitamin with minerals tablet 1 tablet  (1 tablet Oral Not Given 12/03/17 2052)  nortriptyline (PAMELOR) capsule 75 mg (75 mg Oral Given 12/03/17 2117)  traZODone (DESYREL) tablet 50 mg (50 mg Oral Given 12/03/17 2117)  predniSONE (DELTASONE) tablet 40 mg (has no administration in time range)  zolpidem (AMBIEN) tablet 5 mg (has no administration in time range)  regadenoson (LEXISCAN) injection SOLN 0.4 mg (has no administration in time range)  furosemide (LASIX) injection 20 mg (20 mg Intravenous Given 12/03/17 1757)     Initial Impression / Assessment and Plan / ED Course  I have reviewed the triage vital signs and the nursing notes.  Pertinent labs & imaging results that were available during my care of the patient were reviewed by me and considered in my medical decision making (see chart for details).     Wayne Booth is a 77 y.o. male with PMHx of atrial flutter (on dilt, eliquis) who p/w fatigue, chest pain, palpitations, diaphoresis x 1 day. Reviewed and confirmed nursing documentation for past medical history, family history, social history. VS afebrile, HR 140s, BP wnl. Exam remarkable for tachycardia, mild tachypnea, minimal erythema to dorsum of left foot. Favor gout > cellulitis for foot erythema/pain. EKG as above c/w atrial flutter with RVR. Suspect symptoms related to RVR, though question ACS given diaphoresis and pain associated with symptoms.   Given dilt bouls and started on infusion. Converted to NSR per repeat EKG. I-stat trop neg. CXR with possible left pleural effusion, bibasilar atelectasis. CBC with leukocytosis of 19K (though favor 2/2 steroid use), BMP with mild hyperkalemia 5.2. BNP mildly elevated at 272. CXR with mild cardiomegaly, question small left effusion. Given lasix 20mg  IV.   Old records reviewed. Labs reviewed by me and used in the medical decision making.  Imaging viewed and interpreted by me and used in the medical decision making (formal interpretation from radiologist). EKG reviewed by me and used  in the medical decision making.Admitted  to hospitalist.    Final Clinical Impressions(s) / ED Diagnoses   Final diagnoses:  Atrial flutter, unspecified type [facility])  Chest pain, unspecified type    ED Discharge Orders    None       Norm Salt, MD 12/04/17 [id number]    Margette Fast, MD 12/04/17 [phone]

## 2017-12-03 NOTE — ED Notes (Signed)
Report called.  Cardiology just walked into room.

## 2017-12-03 NOTE — Telephone Encounter (Signed)
See note

## 2017-12-03 NOTE — Telephone Encounter (Signed)
Patient called and says "I was started on some new medication yesterday for my foot, methylprednisolone and Doxycycline. I took them both yesterday. Today I took them and I've been dizzy, to the point I had to hold on to something because my legs got wobbly. I'm also having some mild SOB. I don't know if it's these medications, do I need to stop them? My foot is looking better and I know I need some medicine. Will you find out what I need to do? If I need to come in, I will. If someone will give me a call to let me know." I advised this will be sent to [facility] for review and he will receive a call, he verbalized understanding.   Reason for Disposition . Caller has URGENT medication question about med that PCP prescribed and triager unable to answer question  Answer Assessment - Initial Assessment Questions 1. SYMPTOMS: "Do you have any symptoms?"     Dizziness 2. SEVERITY: If symptoms are present, ask "Are they mild, moderate or severe?"     Moderate  Protocols used: MEDICATION QUESTION CALL-A-AH

## 2017-12-03 NOTE — ED Notes (Signed)
ATtempted report. 

## 2017-12-03 NOTE — Telephone Encounter (Signed)
Called patient to schedule an appointment. Left a voicemail message asking for a return phone call

## 2017-12-03 NOTE — ED Triage Notes (Signed)
Pt here from home via [facility] to ED c/o acute onset sob, tachycardia  And chest pain this am.  HR initially in 1521's with bp 178/98.  Given 324 asa and 4 nitro that  Decreased bp to 117/63 and pain to 0.

## 2017-12-03 NOTE — Progress Notes (Signed)
Received pt to 4E08 from ED; VSS, tele applied and verified x2, currently NSR in 80s; pt denies chest pain or SOB; oriented to room and call light, wife updated on plan of care, will continue to monitor.  Jaymes Graff, RN

## 2017-12-03 NOTE — H&P (Addendum)
TRH H&P   Patient Demographics:    Wayne Booth, is a 77 y.o. male  MRN: [id number]   DOB - 12/17/1940  Admit Date - 12/03/2017  Outpatient Primary MD for the patient is Yong Channel Brayton Mars, MD  Referring MD: Dr Laverta Baltimore  Outpatient Specialists: Dr Irish Lack ( cardiology)  Patient coming from: home  Chief Complaint  Patient presents with  . Tachycardia  . Chest Pain      HPI:    Wayne Booth  is a 77 y.o. 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 fib with RVR.  He was started on Cardizem along with Eliquis.  Echo showed EF of 50-55% with grade 2 diastolic dysfunction and restricted noncoronary cusp mobility of the aortic valve.  Patient was doing well and saw his cardiologist about 3 weeks back.  Patient did well until 1 week ago when he had pain and swelling of his right foot.  He saw his PCP yesterday and was started on prednisone taper along with doxycycline for suspected gout versus cellulitis.  This morning when he was ambulating around the house he felt dizzy and unsteady on his feet.  Shortly thereafter he became short of breath and had sharp substernal/left-sided chest pain radiating to the right side.  The pain was tightening, 5/10 in severity and lasting throughout the day.  He also was diaphoretic.  Denies similar pain in the past.  Denies any headache, blurred vision, orthopnea, PND, fevers, chills, nausea, vomiting, abdominal pain, bowel or urinary symptoms.  Denies noticing pain in his calves or leg.  He reports being adherent to his Cardizem and his blood  thinner. He does report traveling recently to the beach for fishing.  Course in the ED Patient was in rapid A. fib with heart rate in 150s.  O2 sat was stable in low 90s, tachypneic in the 20s, improved to mid 90s on 2 L.  Blood pressure was stable.  Blood work showed WBC of 19.2K, sodium of 134, potassium 5.2, normal renal function.  BNP of 271 she will troponin was negative.  EKG showed rapid atrial flutter with heart rate of 149.  Patient given 20 mg IV Cardizem bolus followed by Cardizem drip and subsequently his heart rate had improved to sinus rhythm.  Patient was also given 20 mg IV Lasix in the ED. He had received sublingual nitrate by the EMS after which the chest pain had subsided.  Hospitalist consulted for admission to telemetry.     Review of systems:    In addition to the HPI above, No Fever-chills, No Headache, No changes with Vision or hearing,  No problems swallowing food or Liquids, Chest pain+,  shortness of breath, no cough, diaphoresis+ No Abdominal pain, No nausea or vomiting, bowel movements are regular, No Blood in stool or Urine, No dysuria, No new skin rashes or bruises, No new joints pains-aches,  No new weakness, tingling, numbness in any extremity, unsteady gait No recent weight gain or loss, No polyuria, polydypsia or polyphagia, No significant Mental Stressors.     With Past History of the following :    Past Medical History:  Diagnosis Date  . Allergy    States his nose runs when he is around grease.   Marland Kitchen Anxiety   . Arthritis   . Atrial fibrillation ([place])   . Cancer ([place])   . Coronary artery disease   . History of total knee replacement   . Hypertension   . Hypothyroidism   . Kidney cancer, primary, with metastasis from kidney to other site [facility])   . Kidney stone   . OSA (obstructive sleep apnea)    pt does not wear cpap at night  . Pituitary cyst ([place])   . Pneumonia    hx of  . Prostate cancer [facility])       Past Surgical History:   Procedure Laterality Date  . ANTERIOR CERVICAL DECOMP/DISCECTOMY FUSION N/A 08/29/2012   Procedure: ANTERIOR CERVICAL DECOMPRESSION/DISCECTOMY FUSION 1 LEVEL;  Surgeon: Eustace Moore, MD;  Location: [facility];  Service: Neurosurgery;  Laterality: N/A;  . APPENDECTOMY    . CARDIAC CATHETERIZATION  2008   clean  . CHOLECYSTECTOMY    . COLONOSCOPY W/ POLYPECTOMY    . CRANIOTOMY N/A 11/08/2012   Procedure: CRANIOTOMY HYPOPHYSECTOMY TRANSNASAL APPROACH;  Surgeon: Faythe Ghee, MD;  Location: [place];  Service: Neurosurgery;  Laterality: N/A;  Transphenoidal Resection of Pituitary Tumor  . FINGER SURGERY Left 2017   Dr Amedeo Plenty  . INSERTION PROSTATE RADIATION SEED    . KIDNEY STONE SURGERY    . KNEE ARTHROSCOPY  2007   left  . NEPHRECTOMY Left   . POSTERIOR CERVICAL LAMINECTOMY Left 04/24/2015   Procedure: Foraminotomy cervical five - cervical six cervical six - cervical seven left;  Surgeon: Eustace Moore, MD;  Location: [facility];  Service: Neurosurgery;  Laterality: Left;  . REPLACEMENT TOTAL KNEE BILATERAL        Social History:     Social History   Tobacco Use  . Smoking status: Former Smoker    Years: 4.00    Types: Cigars  . Smokeless tobacco: Never Used  . Tobacco comment: 1 cigar per month, with golf, last one was 3-4 months.   Substance Use Topics  . Alcohol use: Yes    Alcohol/week: 1.2 oz    Types: 2 Shots of liquor per week    Comment: socially     Lives -with wife  Mobility -independent     Family History :     Family History  Problem Relation Age of Onset  . Cancer Father        ?mouth, died before patient born  . Cancer Mother        stomach, died when he was young  . Kidney cancer Sister   . Colon cancer Brother 36  . Esophageal cancer Neg Hx   . Rectal cancer Neg Hx   . Stomach cancer Neg Hx       Home Medications:   Prior to Admission medications   Medication Sig Start Date End Date Taking? Authorizing Provider  albuterol  (PROVENTIL HFA;VENTOLIN HFA) 108 (90 Base) MCG/ACT inhaler Inhale 2 puffs into the lungs every 6 (six) hours as needed for wheezing or shortness of breath. 08/11/17   Rigoberto Noel, MD  apixaban (ELIQUIS) 5 MG TABS tablet Take 1 tablet (5 mg total) by mouth 2 (two) times daily. 11/03/17   Georgette Shell, MD  clotrimazole-betamethasone (LOTRISONE) cream Apply 1 application topically 2 (two) times daily. Use for 10 days maximum. 08/27/16   Marin Olp, MD  cyclobenzaprine (FLEXERIL) 10 MG tablet Take 10 mg by mouth daily as needed for muscle pain. 09/30/17   [provider]  diltiazem (CARDIZEM CD) 120 MG 24 hr capsule Take 1 capsule (120 mg total) by mouth daily. 11/04/17   Georgette Shell, MD  doxycycline (VIBRA-TABS) 100 MG tablet Take 1 tablet (100 mg total) by mouth 2 (two) times daily. 12/02/17   Inda Coke, PA  escitalopram (LEXAPRO) 20 MG tablet Take 1 tablet (20 mg total) by mouth daily. TAKE 20 mg every day 08/12/16   Marin Olp, MD  levothyroxine (SYNTHROID, LEVOTHROID) 150 MCG tablet TAKE ONE TABLET BY MOUTH DAILY 07/06/17   Marin Olp, MD  losartan (COZAAR) 25 MG tablet Take 1 tablet (25 mg total) by mouth daily. 11/09/17   Marin Olp, MD  methylPREDNISolone (MEDROL DOSEPAK) 4 MG TBPK tablet 6-5-4-3-2-1 off 12/02/17   Inda Coke, PA  Multiple Vitamins-Minerals (MULTIVITAMINS THER. W/MINERALS) TABS Take 1 tablet by mouth daily.    [provider]  nortriptyline (PAMELOR) 75 MG capsule TAKE ONE CAPSULE BY MOUTH AT BEDTIME 06/09/17   Marin Olp, MD  traMADol (ULTRAM) 50 MG tablet Take 50 mg by mouth daily as needed for severe pain. 09/30/17   [provider]  traZODone (DESYREL) 50 MG tablet TAKE ONE HALF TO ONE TABLET BY MOUTH EVERY NIGHT AT BEDTIME AS NEEDED FOR SLEEP 06/09/17   Marin Olp, MD     Allergies:    No Known Allergies   Physical Exam:   Vitals  Blood pressure 137/75, pulse 93, temperature 98.2 F  (36.8 C), temperature source Oral, resp. rate (!) 21, height 5\' 11"  (1.803 m), weight 132.5 kg (292 lb), SpO2 97 %.   General: Morbidly obese male lying in bed, not in distress, anxious HEENT: No pallor, no icterus, no JVD, moist oral mucosa, no cervical lymphadenopathy, supple neck Chest: Fine left lung base crackles, no rhonchi or wheeze CVS: S1 and S2 regular, no murmurs rub or gallop GI: Soft, nondistended, nontender, bowel sounds present Musculoskeletal: Warm, trace bilateral pedal edema, mild erythema over base of the left great toe and plantar surface, no warmth or tenderness CNS: Alert and oriented, nonfocal    Data Review:    CBC Recent Labs  Lab 12/03/17 1539  WBC 19.2*  HGB 15.5  HCT 46.7  PLT 358  MCV 89.3  MCH 29.6  MCHC 33.2  RDW 14.2   ------------------------------------------------------------------------------------------------------------------  Chemistries  Recent Labs  Lab 12/03/17 1539  NA 134*  K 5.2*  CL 103  CO2 22  GLUCOSE 140*  BUN 18  CREATININE 1.22  CALCIUM 8.9   ------------------------------------------------------------------------------------------------------------------ estimated creatinine clearance is 72.7 mL/min (by C-G formula based on SCr of 1.22 mg/dL). ------------------------------------------------------------------------------------------------------------------ No results for input(s): TSH, T4TOTAL, T3FREE, THYROIDAB in the last 72 hours.  Invalid input(s): FREET3  Coagulation profile No results for input(s): INR, PROTIME in the last 168 hours. ------------------------------------------------------------------------------------------------------------------- No results for input(s): DDIMER in the last 72  hours. -------------------------------------------------------------------------------------------------------------------  Cardiac Enzymes No results for input(s): CKMB, TROPONINI, MYOGLOBIN in the last 168  hours.  Invalid input(s): CK ------------------------------------------------------------------------------------------------------------------    Component Value Date/Time   BNP 271.9 (H) 12/03/2017 1539     ---------------------------------------------------------------------------------------------------------------  Urinalysis    Component Value Date/Time   COLORURINE YELLOW 11/02/2017 0347   APPEARANCEUR CLEAR 11/02/2017 0347   LABSPEC 1.011 11/02/2017 0347   PHURINE 5.0 11/02/2017 0347   GLUCOSEU NEGATIVE 11/02/2017 0347   HGBUR NEGATIVE 11/02/2017 0347   HGBUR 2+ 09/22/2007 1444   BILIRUBINUR NEGATIVE 11/02/2017 0347   BILIRUBINUR n 11/07/2015 1417   KETONESUR NEGATIVE 11/02/2017 0347   PROTEINUR NEGATIVE 11/02/2017 0347   UROBILINOGEN 1.0 11/07/2015 1417   UROBILINOGEN 1.0 05/22/2011 1454   NITRITE NEGATIVE 11/02/2017 0347   LEUKOCYTESUR NEGATIVE 11/02/2017 0347    ----------------------------------------------------------------------------------------------------------------   Imaging Results:    Dg Chest Portable 1 View  Result Date: 12/03/2017 CLINICAL DATA:  Tachycardia and chest pain. EXAM: PORTABLE CHEST 1 VIEW COMPARISON:  11/01/2017 FINDINGS: Mild cardiomegaly. Aortic atherosclerosis. Poor inspiration. Question small left pleural effusion with basilar atelectasis. IMPRESSION: Question small left effusion with basilar atelectasis. Electronically Signed   By: Nelson Chimes M.D.   On: 12/03/2017 15:57    My personal review of EKG: A flutter with RVR at 149, no ST-T changes   Assessment & Plan:    Principal Problem:   Atrial fibrillation/flutter with RVR (HCC) Unclear what exactly triggered his symptoms.  Patient reported being adherent to both Cardizem and Eliquis.   Admit to telemetry.  Heart rate is in the 90s at present and in sinus rhythm after starting on Cardizem drip.  Wean Cardizem drip as tolerated.  Had recent 2D echo showed does not need to be  repeated.  Cycle serial troponin. -I have consulted cardiology (spoke with Dr Acie Fredrickson) as patient having recurrent a flutter despite adhering to medication.  Also given history of orthostatic symptoms I am hesitant to increase his Cardizem dose at present.  Active Problems: Hyperkalemia Hold ACE inhibitor.  Given a dose of IV Lasix in the ED.  ?  Acute on chronic diastolic CHF Has mild symptoms with left small left-sided pleural effusion on chest x-ray.  Also has mild leg edema.  Received a dose of IV Lasix in the ED.  Will monitor for now.  Check I's and O's. Recent VQ scan negative for PE.  Chest pain Possibly triggered by dyspnea and rapid A. fib.  Cycle serial enzymes.  Sublingual nitroglycerin as needed.    Morbid obesity ([place])  patient reports actively working on his diet and exercise to lose weight.    Essential hypertension Stable.  Monitor on Cardizem drip.  Hold ACE inhibitor.    OSA (obstructive sleep apnea) Being referred for sleep study as outpatient.    Hypothyroidism Continue Synthroid.  Recent TSH was normal    History of pulmonary embolism Treated with Xarelto    Hyperkalemia Hold ACE inhibitor.  Monitor in a.m.    Acute gouty arthritis On prednisone as outpatient (started yesterday) we will continue.  Also on empiric doxycycline (since yesterday) for possible cellulitis which will be continued as well.  Anxiety and?  Depression Continue home medications.  Chronic Back pain Resume home medications  DVT Prophylaxis on Eliquis  AM Labs Ordered, also please review Full Orders  Family Communication: Admission, patients condition and plan of care including tests being ordered have been discussed with the patient and his wife in detail at bedside  Code  Status full code  Likely DC to  home  Condition fair  Consults called: cardiology   Admission status: Inpatient  Time spent in minutes : 60   Zaniya Mcaulay M.D on 12/03/2017 at 5:56 PM  Between 7am  to 7pm - Pager - [phone]. After 7pm go to [email or web address] - password [email or web address]  [facility] - Office  [phone]

## 2017-12-04 ENCOUNTER — Inpatient Hospital Stay (HOSPITAL_COMMUNITY): Payer: Medicare Other

## 2017-12-04 DIAGNOSIS — I5023 Acute on chronic systolic (congestive) heart failure: Secondary | ICD-10-CM

## 2017-12-04 DIAGNOSIS — R079 Chest pain, unspecified: Secondary | ICD-10-CM

## 2017-12-04 LAB — BASIC METABOLIC PANEL
ANION GAP: 7 (ref 5–15)
BUN: 17 mg/dL (ref 6–20)
CALCIUM: 8.6 mg/dL — AB (ref 8.9–10.3)
CO2: 27 mmol/L (ref 22–32)
Chloride: 102 mmol/L (ref 101–111)
Creatinine, Ser: 1.2 mg/dL (ref 0.61–1.24)
GFR, EST NON AFRICAN AMERICAN: 57 mL/min — AB (ref >60)
Glucose, Bld: 96 mg/dL (ref 65–99)
Potassium: 4.2 mmol/L (ref 3.5–5.1)
SODIUM: 136 mmol/L (ref 135–145)

## 2017-12-04 LAB — C-REACTIVE PROTEIN: CRP: 1.3 mg/dL — AB (ref <1.0)

## 2017-12-04 LAB — TROPONIN I: TROPONIN I: 0.06 ng/mL — AB (ref <0.03)

## 2017-12-04 LAB — SEDIMENTATION RATE: Sed Rate: 9 mm/hr (ref 0–16)

## 2017-12-04 LAB — URIC ACID: URIC ACID, SERUM: 8.3 mg/dL — AB (ref 4.4–7.6)

## 2017-12-04 MED ORDER — FUROSEMIDE 20 MG PO TABS
20.0000 mg | ORAL_TABLET | Freq: Every day | ORAL | Status: DC
  Administered 2017-12-04 – 2017-12-08 (×5): 20 mg via ORAL
  Filled 2017-12-04 (×5): qty 1

## 2017-12-04 MED ORDER — TECHNETIUM TC 99M TETROFOSMIN IV KIT
30.0000 | PACK | Freq: Once | INTRAVENOUS | Status: AC | PRN
  Administered 2017-12-04: 30 via INTRAVENOUS

## 2017-12-04 MED ORDER — FUROSEMIDE 10 MG/ML IJ SOLN
60.0000 mg | Freq: Once | INTRAMUSCULAR | Status: AC
  Administered 2017-12-04: 60 mg via INTRAVENOUS
  Filled 2017-12-04: qty 6

## 2017-12-04 NOTE — Progress Notes (Signed)
[facility]                                                                              Patient Demographics  Wayne Booth, is a 77 y.o. male, DOB - September 09, 1940, ULA:[id number]  Admit date - 12/03/2017   Admitting Physician Louellen Molder, MD  Outpatient Primary MD for the patient is Yong Channel Brayton Mars, MD  Outpatient specialists:   LOS - 1  days   Medical records reviewed and are as summarized below:    Chief Complaint  Patient presents with  . Tachycardia  . Chest Pain       Brief summary   Patient is a 77 year old male with obesity, OSA, hypertension, intermittent dizziness due to orthostasis, history of PE treated with Xarelto in the past, renal cell CA status post nephrectomy, hospitalized a month back due to atrial for ablation with RVR.  Patient was doing well until 1 week ago when he started having pain and swelling in his right foot, saw his PCP a day before admission and was started on prednisone taper along with doxycycline for suspected gout versus cellulitis.  On the morning of admission, felt dizzy and unsteady, shortness of breath with sharp left-sided substernal chest pain.  In ED patient was found to be in rapid atrial for ablation with heart rate in 150s.  Patient was given IV Cardizem bolus 20 mg and placed on Cardizem drip with IV Lasix 20 mg.  Patient was admitted for further work-up.   Assessment & Plan    Principal Problem:   Atrial fibrillation with RVR (HCC) -Heart rate currently controlled, continue Cardizem drip -2D echo 11/02/2017 showed EF of 50 to 55% with grade 2 diastolic dysfunction. -Continue Eliquis -Cardiology recommending possible amiodarone and EP consult, will follow  Active Problems:  Chest pain: Likely worsened due to rapid atrial fibrillation -Recent 2D echo showed EF of 50 to 55% with grade 2 diastolic dysfunction -Cardiology recommended 2 days Myoview study.   Acute on chronic diastolic CHF exacerbation -  Worsened due to rapid atrial fibrillation with RVR, decompensated fairly quickly hence will likely benefit from antiarrhythmic -Received Lasix 20 mg IV x1 in ED, not on oral Lasix at home -Negative balance of 1.49 L's  - Placed on Lasix 20 mg oral daily, titrate up per cardiology recommendation    morbid obesity ([place]) -Counseled on diet and weight control -BMI 40.5    Essential hypertension -Currently stable, continue Cardizem, on low-dose Lasix    OSA (obstructive sleep apnea) -Per patient, being referred for sleep study outpatient    Hypothyroidism Continue Synthroid recent TSH normal    History of pulmonary embolism -Completed Xarelto, continue Eliquis Recent VQ scan negative for PE    Acute gouty arthritis, left foot pain with mild cellulitis -Continue prednisone 40 mg daily, check ESR, CRP, uric acid to assess if patient needs to be on allopurinol at dc.  If inflammatory markers elevated, will also place on colchicine -Continue doxycycline   Code Status: Full code DVT Prophylaxis: Apixaban Family Communication: Discussed in detail with the patient, all imaging results, lab results explained to the patient    Disposition  Plan:   Time Spent in minutes 35 minutes  Procedures:  None  Consultants:   Cardiology  Antimicrobials:      Medications  Scheduled Meds: . apixaban  5 mg Oral BID  . doxycycline  100 mg Oral BID  . escitalopram  20 mg Oral Daily  . levothyroxine  150 mcg Oral QAC breakfast  . multivitamin with minerals  1 tablet Oral Daily  . nortriptyline  75 mg Oral QHS  . predniSONE  40 mg Oral Q breakfast  . regadenoson  0.4 mg Intravenous Once  . traZODone  50 mg Oral QHS   Continuous Infusions: . diltiazem (CARDIZEM) infusion 10 mg/hr (12/04/17 0838)   PRN Meds:.acetaminophen **OR** acetaminophen, albuterol, cyclobenzaprine, nitroGLYCERIN, ondansetron **OR** ondansetron (ZOFRAN) IV, zolpidem   Antibiotics   Anti-infectives (From  admission, onward)   Start     Dose/Rate Route Frequency Ordered Stop   12/03/17 2200  doxycycline (VIBRA-TABS) tablet 100 mg     100 mg Oral 2 times daily 12/03/17 1948          Subjective:   Derrico Zhong was seen and examined today.  Feels better today, heart rate controlled, no chest pain or shortness of breath the time of my examination. Patient denies dizziness, abdominal pain, N/V/D/C, new weakness, numbess, tingling. No acute events overnight.    Objective:   Vitals:   12/03/17 1900 12/03/17 1947 12/03/17 1959 12/04/17 0455  BP: (!) 141/77  113/68 114/63  Pulse: 87     Resp: (!) 22  (!) 22 14  Temp:   97.9 F (36.6 C) 97.7 F (36.5 C)  TempSrc:   Oral Oral  SpO2: 97%  97% 95%  Weight:  131.8 kg (290 lb 8 oz)    Height:  '5\' 11"'  (1.803 m)      Intake/Output Summary (Last 24 hours) at 12/04/2017 0959 Last data filed at 12/04/2017 0548 Gross per 24 hour  Intake 356 ml  Output 1850 ml  Net -1494 ml     Wt Readings from Last 3 Encounters:  12/03/17 131.8 kg (290 lb 8 oz)  12/02/17 132.5 kg (292 lb 3.2 oz)  11/11/17 133.4 kg (294 lb)     Exam  General: Alert and oriented x 3, NAD  Eyes:  HEENT:  Atraumatic, normocephalic, normal oropharynx  Cardiovascular: S1 S2 auscultated, RRR, trace pedal edema.  Respiratory: Decreased breath sound at the bases  Gastrointestinal: Soft, nontender, nondistended, + bowel sounds  Ext: trace pedal edema bilaterally  Neuro: no new deficit  Musculoskeletal: No digital cyanosis, clubbing  Skin: No rashes  Psych: Normal affect and demeanor, alert and oriented x3    Data Reviewed:  I have personally reviewed following labs and imaging studies  Micro Results No results found for this or any previous visit (from the past 240 hour(s)).  Radiology Reports Dg Chest Portable 1 View  Result Date: 12/03/2017 CLINICAL DATA:  Tachycardia and chest pain. EXAM: PORTABLE CHEST 1 VIEW COMPARISON:  11/01/2017 FINDINGS: Mild  cardiomegaly. Aortic atherosclerosis. Poor inspiration. Question small left pleural effusion with basilar atelectasis. IMPRESSION: Question small left effusion with basilar atelectasis. Electronically Signed   By: Nelson Chimes M.D.   On: 12/03/2017 15:57    Lab Data:  CBC: Recent Labs  Lab 12/03/17 1539  WBC 19.2*  HGB 15.5  HCT 46.7  MCV 89.3  PLT 364   Basic Metabolic Panel: Recent Labs  Lab 12/03/17 1539 12/04/17 0747  NA 134* 136  K 5.2* 4.2  CL  103 102  CO2 22 27  GLUCOSE 140* 96  BUN 18 17  CREATININE 1.22 1.20  CALCIUM 8.9 8.6*   GFR: Estimated Creatinine Clearance: 73.7 mL/min (by C-G formula based on SCr of 1.2 mg/dL). Liver Function Tests: No results for input(s): AST, ALT, ALKPHOS, BILITOT, PROT, ALBUMIN in the last 168 hours. No results for input(s): LIPASE, AMYLASE in the last 168 hours. No results for input(s): AMMONIA in the last 168 hours. Coagulation Profile: No results for input(s): INR, PROTIME in the last 168 hours. Cardiac Enzymes: Recent Labs  Lab 12/03/17 2004 12/04/17 0242 12/04/17 0747  TROPONINI <0.03 0.06* <0.03   BNP (last 3 results) No results for input(s): PROBNP in the last 8760 hours. HbA1C: No results for input(s): HGBA1C in the last 72 hours. CBG: No results for input(s): GLUCAP in the last 168 hours. Lipid Profile: No results for input(s): CHOL, HDL, LDLCALC, TRIG, CHOLHDL, LDLDIRECT in the last 72 hours. Thyroid Function Tests: No results for input(s): TSH, T4TOTAL, FREET4, T3FREE, THYROIDAB in the last 72 hours. Anemia Panel: No results for input(s): VITAMINB12, FOLATE, FERRITIN, TIBC, IRON, RETICCTPCT in the last 72 hours. Urine analysis:    Component Value Date/Time   COLORURINE YELLOW 11/02/2017 [place] 11/02/2017 0347   LABSPEC 1.011 11/02/2017 0347   PHURINE 5.0 11/02/2017 0347   GLUCOSEU NEGATIVE 11/02/2017 0347   HGBUR NEGATIVE 11/02/2017 0347   HGBUR 2+ 09/22/2007 1444   BILIRUBINUR  NEGATIVE 11/02/2017 0347   BILIRUBINUR n 11/07/2015 1417   KETONESUR NEGATIVE 11/02/2017 0347   PROTEINUR NEGATIVE 11/02/2017 0347   UROBILINOGEN 1.0 11/07/2015 1417   UROBILINOGEN 1.0 05/22/2011 1454   NITRITE NEGATIVE 11/02/2017 0347   LEUKOCYTESUR NEGATIVE 11/02/2017 0347     Meade Hogeland M.D. [facility] 12/04/2017, 9:59 AM  Pager: [phone] Between 7am to 7pm - call Pager - [phone]  After 7pm go to [email or web address] - password [email or web address]  Call night coverage person covering after 7pm

## 2017-12-04 NOTE — Progress Notes (Signed)
Progress Note  Patient Name: Wayne Booth Date of Encounter: 12/04/2017  Primary Cardiologist: Larae Grooms, MD   Subjective   No CP  Breathing better from yesterday but still a little short    Inpatient Medications    Scheduled Meds: . apixaban  5 mg Oral BID  . doxycycline  100 mg Oral BID  . escitalopram  20 mg Oral Daily  . furosemide  20 mg Oral Daily  . levothyroxine  150 mcg Oral QAC breakfast  . multivitamin with minerals  1 tablet Oral Daily  . nortriptyline  75 mg Oral QHS  . predniSONE  40 mg Oral Q breakfast  . regadenoson  0.4 mg Intravenous Once  . traZODone  50 mg Oral QHS   Continuous Infusions: . diltiazem (CARDIZEM) infusion 10 mg/hr (12/04/17 0838)   PRN Meds: acetaminophen **OR** acetaminophen, albuterol, cyclobenzaprine, nitroGLYCERIN, ondansetron **OR** ondansetron (ZOFRAN) IV, zolpidem   Vital Signs    Vitals:   12/03/17 1900 12/03/17 1947 12/03/17 1959 12/04/17 0455  BP: (!) 141/77  113/68 114/63  Pulse: 87     Resp: (!) 22  (!) 22 14  Temp:   97.9 F (36.6 C) 97.7 F (36.5 C)  TempSrc:   Oral Oral  SpO2: 97%  97% 95%  Weight:  131.8 kg (290 lb 8 oz)    Height:  5\' 11"  (1.803 m)      Intake/Output Summary (Last 24 hours) at 12/04/2017 1105 Last data filed at 12/04/2017 0548 Gross per 24 hour  Intake 356 ml  Output 1850 ml  Net -1494 ml   Filed Weights   12/03/17 1527 12/03/17 1947  Weight: 132.5 kg (292 lb) 131.8 kg (290 lb 8 oz)    Telemetry    SR   - Personally Reviewed  ECG      Physical Exam   Pt at [facility] imaging   Labs    Chemistry Recent Labs  Lab 12/03/17 1539 12/04/17 0747  NA 134* 136  K 5.2* 4.2  CL 103 102  CO2 22 27  GLUCOSE 140* 96  BUN 18 17  CREATININE 1.22 1.20  CALCIUM 8.9 8.6*  GFRNONAA 56* 57*  GFRAA >60 >60  ANIONGAP 9 7     Hematology Recent Labs  Lab 12/03/17 1539  WBC 19.2*  RBC 5.23  HGB 15.5  HCT 46.7  MCV 89.3  MCH 29.6  MCHC 33.2  RDW 14.2  PLT 358     Cardiac Enzymes Recent Labs  Lab 12/03/17 2004 12/04/17 0242 12/04/17 0747  TROPONINI <0.03 0.06* <0.03    Recent Labs  Lab 12/03/17 1546  TROPIPOC 0.00     BNP Recent Labs  Lab 12/03/17 1539  BNP 271.9*     DDimer No results for input(s): DDIMER in the last 168 hours.   Radiology    Dg Chest Portable 1 View  Result Date: 12/03/2017 CLINICAL DATA:  Tachycardia and chest pain. EXAM: PORTABLE CHEST 1 VIEW COMPARISON:  11/01/2017 FINDINGS: Mild cardiomegaly. Aortic atherosclerosis. Poor inspiration. Question small left pleural effusion with basilar atelectasis. IMPRESSION: Question small left effusion with basilar atelectasis. Electronically Signed   By: Nelson Chimes M.D.   On: 12/03/2017 15:57    Cardiac Studies     Patient Profile     77 y.o. male with a hx of HTN, OSA, history of pituitary tumor s/p transsphenoidal resection on Synthroid, history of renal cell carcinoma status post nephrectomy and recent diagnosis of atrial flutter on anticoagulation and diltiazem  who is being seen today for the evaluation of chest pain and palpitations at the request of Dr. Clementeen Graham .    Assessment & Plan    1  Atrial fibrillation with RFVR  Converted to SR yesterday    Does not toleate well  Severe SOB and CP with it Hold dilt now until after myovue tomorrow   WIll review with EP    Continue Eliquis    2  Chest discomfrt   Getting myovue today/tomorrow    Rest portion   Stress tomorrow    3  Acute on chronic diastolic CHF  Volume difficult with wt.  Will give additional lasix x 1    4  Possible OSA  Will need outpt sleep eval   5  Morbid obesity  BMI greater than 40  Needs to lose wt.      For questions or updates, please contact [place] Please consult [email or web address] for contact info under Cardiology/STEMI.      Signed, Dorris Carnes, MD  12/04/2017, 11:05 AM

## 2017-12-05 ENCOUNTER — Inpatient Hospital Stay (HOSPITAL_COMMUNITY): Payer: Medicare Other

## 2017-12-05 DIAGNOSIS — I10 Essential (primary) hypertension: Secondary | ICD-10-CM

## 2017-12-05 DIAGNOSIS — Z86711 Personal history of pulmonary embolism: Secondary | ICD-10-CM

## 2017-12-05 DIAGNOSIS — I5031 Acute diastolic (congestive) heart failure: Secondary | ICD-10-CM

## 2017-12-05 DIAGNOSIS — G4733 Obstructive sleep apnea (adult) (pediatric): Secondary | ICD-10-CM

## 2017-12-05 LAB — NM MYOCAR MULTI W/SPECT W/WALL MOTION / EF
CHL CUP MPHR: 145 {beats}/min
CHL CUP RESTING HR STRESS: 70 {beats}/min
CSEPED: 0 min
Estimated workload: 1 METS
Exercise duration (sec): 0 s
Peak HR: 79 {beats}/min
Percent HR: 54 %

## 2017-12-05 LAB — URIC ACID: Uric Acid, Serum: 8.6 mg/dL — ABNORMAL HIGH (ref 4.4–7.6)

## 2017-12-05 MED ORDER — REGADENOSON 0.4 MG/5ML IV SOLN
INTRAVENOUS | Status: AC
  Administered 2017-12-05: 0.4 mg
  Filled 2017-12-05: qty 5

## 2017-12-05 MED ORDER — REGADENOSON 0.4 MG/5ML IV SOLN
0.4000 mg | Freq: Once | INTRAVENOUS | Status: DC
  Filled 2017-12-05: qty 5

## 2017-12-05 MED ORDER — SENNOSIDES-DOCUSATE SODIUM 8.6-50 MG PO TABS
1.0000 | ORAL_TABLET | Freq: Every evening | ORAL | Status: DC | PRN
Start: 2017-12-05 — End: 2017-12-08
  Administered 2017-12-06: 1 via ORAL
  Filled 2017-12-05: qty 1

## 2017-12-05 MED ORDER — COLCHICINE 0.6 MG PO TABS
0.6000 mg | ORAL_TABLET | Freq: Every day | ORAL | Status: DC
  Administered 2017-12-05 – 2017-12-08 (×5): 0.6 mg via ORAL
  Filled 2017-12-05 (×4): qty 1

## 2017-12-05 MED ORDER — TECHNETIUM TC 99M TETROFOSMIN IV KIT
30.0000 | PACK | Freq: Once | INTRAVENOUS | Status: AC | PRN
  Administered 2017-12-05: 30 via INTRAVENOUS

## 2017-12-05 MED ORDER — DILTIAZEM HCL 60 MG PO TABS
60.0000 mg | ORAL_TABLET | Freq: Four times a day (QID) | ORAL | Status: DC
  Administered 2017-12-05 – 2017-12-06 (×4): 60 mg via ORAL
  Filled 2017-12-05 (×4): qty 1

## 2017-12-05 MED ORDER — FUROSEMIDE 10 MG/ML IJ SOLN
60.0000 mg | Freq: Once | INTRAMUSCULAR | Status: AC
  Administered 2017-12-05: 60 mg via INTRAVENOUS
  Filled 2017-12-05: qty 6

## 2017-12-05 NOTE — Discharge Instructions (Signed)

## 2017-12-05 NOTE — Progress Notes (Signed)
[facility]                                                                              Patient Demographics  Wayne Booth, is a 77 y.o. male, DOB - 1940/10/13, VOZ:[id number]  Admit date - 12/03/2017   Admitting Physician Louellen Molder, MD  Outpatient Primary MD for the patient is Yong Channel Brayton Mars, MD  Outpatient specialists:   LOS - 2  days   Medical records reviewed and are as summarized below:    Chief Complaint  Patient presents with  . Tachycardia  . Chest Pain       Brief summary   Patient is a 77 year old male with obesity, OSA, hypertension, intermittent dizziness due to orthostasis, history of PE treated with Xarelto in the past, renal cell CA status post nephrectomy, hospitalized a month back due to atrial for ablation with RVR.  Patient was doing well until 1 week ago when he started having pain and swelling in his right foot, saw his PCP a day before admission and was started on prednisone taper along with doxycycline for suspected gout versus cellulitis.  On the morning of admission, felt dizzy and unsteady, shortness of breath with sharp left-sided substernal chest pain.  In ED patient was found to be in rapid atrial for ablation with heart rate in 150s.  Patient was given IV Cardizem bolus 20 mg and placed on Cardizem drip with IV Lasix 20 mg.  Patient was admitted for further work-up.   Assessment & Plan    Principal Problem:   Atrial fibrillation with RVR (HCC) -Heart rate currently controlled, continue Cardizem drip -2D echo 11/02/2017 showed EF of 50 to 55% with grade 2 diastolic dysfunction. -Continue Eliquis -Appreciate cardiology recommendations recommending transition to oral diltiazem and antiarrhythmic therapy  -Follow 2day stress test results, completed today, continue diuretics  Active Problems:  Chest pain: Likely worsened due to rapid atrial fibrillation -Recent 2D echo showed EF of 50 to 55% with grade 2 diastolic  dysfunction -follow 2 day stress test, completed today   Acute on chronic diastolic CHF exacerbation - Worsened due to rapid atrial fibrillation with RVR, decompensated fairly quickly hence will likely benefit from antiarrhythmic -Received Lasix 20 mg IV x1 in ED, then 75m IV x 1, continue oral Lasix  - negative balance of 4.4 L continue strict I's and O's and daily weights - weight down from 292--> 290 lbs     morbid obesity (HCC) -Counseled on diet and weight control -BMI 40.5    Essential hypertension -Currently stable, continue Cardizem, on low-dose Lasix    OSA (obstructive sleep apnea) -Per patient, being referred for sleep study outpatient    Hypothyroidism Continue Synthroid - recent TSH normal    History of pulmonary embolism -Completed Xarelto, continue Eliquis Recent VQ scan negative for PE    Acute gouty arthritis, left foot pain with mild cellulitis - improving -Continue prednisone 40 mg daily -Uric acid 8.3, CRP 1.3, ESR normal - place on colchicine Allopurinol   -Continue doxycycline   Code Status: Full code DVT Prophylaxis: Apixaban Family Communication: Discussed in detail with the patient, all imaging results,  lab results explained to the patient, sons in the room     Disposition Plan: possible DC in am   Time Spent in minutes 25 minutes  Procedures:  2D echo 5/7 showed EF of 50 to 27%, grade 2 diastolic dysfunction  Consultants:   Cardiology  Antimicrobials:      Medications  Scheduled Meds: . apixaban  5 mg Oral BID  . diltiazem  60 mg Oral Q6H  . doxycycline  100 mg Oral BID  . escitalopram  20 mg Oral Daily  . furosemide  20 mg Oral Daily  . levothyroxine  150 mcg Oral QAC breakfast  . multivitamin with minerals  1 tablet Oral Daily  . nortriptyline  75 mg Oral QHS  . predniSONE  40 mg Oral Q breakfast  . regadenoson  0.4 mg Intravenous Once  . regadenoson  0.4 mg Intravenous Once  . traZODone  50 mg Oral QHS   Continuous  Infusions:  PRN Meds:.acetaminophen **OR** acetaminophen, albuterol, cyclobenzaprine, nitroGLYCERIN, ondansetron **OR** ondansetron (ZOFRAN) IV, zolpidem   Antibiotics   Anti-infectives (From admission, onward)   Start     Dose/Rate Route Frequency Ordered Stop   12/03/17 2200  doxycycline (VIBRA-TABS) tablet 100 mg     100 mg Oral 2 times daily 12/03/17 1948          Subjective:   Wayne Booth was seen and examined today.  Feeling better, heart rate is controlled, still has some peripheral edema.  Shortness of breath is improving.  No chest pain.   Patient denies dizziness, abdominal pain, N/V/D/C, new weakness, numbess, tingling. No acute events overnight.    Objective:   Vitals:   12/05/17 0932 12/05/17 0934 12/05/17 0936 12/05/17 1319  BP: (!) 144/72 129/71 (!) 142/72 126/64  Pulse:    82  Resp:    18  Temp:    97.8 F (36.6 C)  TempSrc:    Oral  SpO2:    95%  Weight:      Height:        Intake/Output Summary (Last 24 hours) at 12/05/2017 1327 Last data filed at 12/05/2017 0648 Gross per 24 hour  Intake 233.83 ml  Output 3025 ml  Net -2791.17 ml     Wt Readings from Last 3 Encounters:  12/03/17 131.8 kg (290 lb 8 oz)  12/02/17 132.5 kg (292 lb 3.2 oz)  11/11/17 133.4 kg (294 lb)     Exam   General: Alert and oriented x 3, NAD  Eyes:   HEENT:  Cardiovascular: S1 S2 auscultated,  Regular rate and rhythm. 1+ pedal edema b/l  Respiratory: Clear to auscultation bilaterally, no wheezing, rales or rhonchi  Gastrointestinal: obese Soft, nontender, nondistended, + bowel sounds  Ext: 1+ pedal edema bilaterally  Neuro: no new deficits  Musculoskeletal: No digital cyanosis, clubbing  Skin: No rashes  Psych: Normal affect and demeanor, alert and oriented x3    Data Reviewed:  I have personally reviewed following labs and imaging studies  Micro Results No results found for this or any previous visit (from the past 240 hour(s)).  Radiology  Reports Dg Chest Portable 1 View  Result Date: 12/03/2017 CLINICAL DATA:  Tachycardia and chest pain. EXAM: PORTABLE CHEST 1 VIEW COMPARISON:  11/01/2017 FINDINGS: Mild cardiomegaly. Aortic atherosclerosis. Poor inspiration. Question small left pleural effusion with basilar atelectasis. IMPRESSION: Question small left effusion with basilar atelectasis. Electronically Signed   By: Nelson Chimes M.D.   On: 12/03/2017 15:57    Lab Data:  CBC: Recent Labs  Lab 12/03/17 1539  WBC 19.2*  HGB 15.5  HCT 46.7  MCV 89.3  PLT 026   Basic Metabolic Panel: Recent Labs  Lab 12/03/17 1539 12/04/17 0747  NA 134* 136  K 5.2* 4.2  CL 103 102  CO2 22 27  GLUCOSE 140* 96  BUN 18 17  CREATININE 1.22 1.20  CALCIUM 8.9 8.6*   GFR: Estimated Creatinine Clearance: 73.7 mL/min (by C-G formula based on SCr of 1.2 mg/dL). Liver Function Tests: No results for input(s): AST, ALT, ALKPHOS, BILITOT, PROT, ALBUMIN in the last 168 hours. No results for input(s): LIPASE, AMYLASE in the last 168 hours. No results for input(s): AMMONIA in the last 168 hours. Coagulation Profile: No results for input(s): INR, PROTIME in the last 168 hours. Cardiac Enzymes: Recent Labs  Lab 12/03/17 2004 12/04/17 0242 12/04/17 0747  TROPONINI <0.03 0.06* <0.03   BNP (last 3 results) No results for input(s): PROBNP in the last 8760 hours. HbA1C: No results for input(s): HGBA1C in the last 72 hours. CBG: No results for input(s): GLUCAP in the last 168 hours. Lipid Profile: No results for input(s): CHOL, HDL, LDLCALC, TRIG, CHOLHDL, LDLDIRECT in the last 72 hours. Thyroid Function Tests: No results for input(s): TSH, T4TOTAL, FREET4, T3FREE, THYROIDAB in the last 72 hours. Anemia Panel: No results for input(s): VITAMINB12, FOLATE, FERRITIN, TIBC, IRON, RETICCTPCT in the last 72 hours. Urine analysis:    Component Value Date/Time   COLORURINE YELLOW 11/02/2017 [place] 11/02/2017 0347   LABSPEC  1.011 11/02/2017 0347   PHURINE 5.0 11/02/2017 0347   GLUCOSEU NEGATIVE 11/02/2017 0347   HGBUR NEGATIVE 11/02/2017 0347   HGBUR 2+ 09/22/2007 1444   BILIRUBINUR NEGATIVE 11/02/2017 0347   BILIRUBINUR n 11/07/2015 1417   KETONESUR NEGATIVE 11/02/2017 0347   PROTEINUR NEGATIVE 11/02/2017 0347   UROBILINOGEN 1.0 11/07/2015 1417   UROBILINOGEN 1.0 05/22/2011 1454   NITRITE NEGATIVE 11/02/2017 0347   LEUKOCYTESUR NEGATIVE 11/02/2017 0347     Bunyan Brier M.D. [facility] 12/05/2017, 1:27 PM  Pager: [phone] Between 7am to 7pm - call Pager - [phone]  After 7pm go to [email or web address] - password [email or web address]  Call night coverage person covering after 7pm

## 2017-12-05 NOTE — Progress Notes (Signed)
Report received from C. Mauri Reading. Reviewed previous assessment with no noted changes at this time. Orders reviewed. Patient resting comfortably in bed at this time. Will continue to monitor closely throughout the night.

## 2017-12-05 NOTE — Progress Notes (Addendum)
Progress Note  Patient Name: Wayne Booth Date of Encounter: 12/05/2017  Primary Cardiologist: Larae Grooms, MD  Subjective   Had brief episode of left lower chest/epigastric discomfort this AM. Currently c/p free.  For stress portion of MV this AM.  Inpatient Medications    Scheduled Meds: . apixaban  5 mg Oral BID  . doxycycline  100 mg Oral BID  . escitalopram  20 mg Oral Daily  . furosemide  20 mg Oral Daily  . levothyroxine  150 mcg Oral QAC breakfast  . multivitamin with minerals  1 tablet Oral Daily  . nortriptyline  75 mg Oral QHS  . predniSONE  40 mg Oral Q breakfast  . regadenoson  0.4 mg Intravenous Once  . regadenoson  0.4 mg Intravenous Once  . traZODone  50 mg Oral QHS   Continuous Infusions: . diltiazem (CARDIZEM) infusion 10 mg/hr (12/05/17 0318)   PRN Meds: acetaminophen **OR** acetaminophen, albuterol, cyclobenzaprine, nitroGLYCERIN, ondansetron **OR** ondansetron (ZOFRAN) IV, zolpidem   Vital Signs    Vitals:   12/04/17 1313 12/04/17 2016 12/05/17 0552 12/05/17 0901  BP: 135/80 (!) 150/81 92/84 139/74  Pulse: 78  69   Resp: 20 19 19    Temp: 97.7 F (36.5 C) 97.8 F (36.6 C) 97.9 F (36.6 C)   TempSrc: Oral Oral Oral   SpO2: 100% 97% 96%   Weight:      Height:        Intake/Output Summary (Last 24 hours) at 12/05/2017 0931 Last data filed at 12/05/2017 0648 Gross per 24 hour  Intake 233.83 ml  Output 3225 ml  Net -2991.17 ml   Filed Weights   12/03/17 1527 12/03/17 1947  Weight: 292 lb (132.5 kg) 290 lb 8 oz (131.8 kg)    Physical Exam   GEN: morbidly obese, in no acute distress.  HEENT: Grossly normal.  Neck: Supple, obese - difficult to gauge jvp. no carotid bruits, or masses. Cardiac: RRR, no murmurs, rubs, or gallops. No clubbing, cyanosis, 1+ bilat ankle edema.  Radials/DP/PT 2+ and equal bilaterally.  Respiratory:  Respirations regular and unlabored, clear to auscultation bilaterally. GI: Obese, soft, nontender,  nondistended, BS + x 4. MS: no deformity or atrophy. Skin: warm and dry, no rash. Neuro:  Strength and sensation are intact. Psych: AAOx3.  Normal affect.  Labs    Chemistry Recent Labs  Lab 12/03/17 1539 12/04/17 0747  NA 134* 136  K 5.2* 4.2  CL 103 102  CO2 22 27  GLUCOSE 140* 96  BUN 18 17  CREATININE 1.22 1.20  CALCIUM 8.9 8.6*  GFRNONAA 56* 57*  GFRAA >60 >60  ANIONGAP 9 7     Hematology Recent Labs  Lab 12/03/17 1539  WBC 19.2*  RBC 5.23  HGB 15.5  HCT 46.7  MCV 89.3  MCH 29.6  MCHC 33.2  RDW 14.2  PLT 358    Cardiac Enzymes Recent Labs  Lab 12/03/17 2004 12/04/17 0242 12/04/17 0747  TROPONINI <0.03 0.06* <0.03    Recent Labs  Lab 12/03/17 1546  TROPIPOC 0.00     BNP Recent Labs  Lab 12/03/17 1539  BNP 271.9*      Radiology    Dg Chest Portable 1 View  Result Date: 12/03/2017 CLINICAL DATA:  Tachycardia and chest pain. EXAM: PORTABLE CHEST 1 VIEW COMPARISON:  11/01/2017 FINDINGS: Mild cardiomegaly. Aortic atherosclerosis. Poor inspiration. Question small left pleural effusion with basilar atelectasis. IMPRESSION: Question small left effusion with basilar atelectasis. Electronically Signed  By: Nelson Chimes M.D.   On: 12/03/2017 15:57    Telemetry    Seen in nuc med - currently in sinus rhythm - Personally Reviewed  Cardiac Studies   2D Echocardiogram 5.2019  Study Conclusions  - Left ventricle: The cavity size was normal. Wall thickness was   increased in a pattern of mild LVH. Systolic function was normal.   The estimated ejection fraction was in the range of 50% to 55%.   Wall motion was normal; there were no regional wall motion   abnormalities. Features are consistent with a pseudonormal left   ventricular filling pattern, with concomitant abnormal relaxation   and increased filling pressure (grade 2 diastolic dysfunction). - Aortic valve: Noncoronary cusp mobility was moderately   restricted. - Mitral valve:  Calcified annulus.  Impressions:  - Technically difficult; definity used; normal LV systolic   function; mild LVH; moderate diastolic dysfunction.  Patient Profile       77 y.o. male with a hx of HTN, OSA, history of pituitary tumors/ptranssphenoidal resection on Synthroid,history of renal cell carcinoma status post nephrectomy and recent diagnosis of atrial flutter on anticoagulation and diltiazemwho is being seen today for the evaluation ofchest pain and palpitationsin the setting of afib  converted to sinus.    Assessment & Plan    1.  PAF:  Admitted w/ afib and chest pain.  Converted to sinus rhythm on 6/7.  Remains in sinus this AM.  Did not tolerate rapid afib well.  Minimal trop bump to 0.06. Flat trend.  CHA2DS2VASc = 3  eliquis.  As he has maintained sinus for >24 hrs yet remains on dilt gtt @ 10/hr, I will switch to dilt 60 q6h for the time being.  ? AAD initiation given poor tolerance.  2.  Chest pain:  In setting of above.  Brief episode of left sided epigastric pain this am.  For MV this AM (second day given size).  Would pursue cath if high risk.    3.  Acute on chronic diastolic CHF:  No dyspnea this AM.  Minus 2.9L overnight and 4.4L since admission.  Review of wts over past six months suggests that he is likely @ dry wt.  Exam limited by habitus.  Still 1+ lower ext edema.  Cont low dose lasix.  HR/BP stable this am.  4.  Probably OSA:  In setting of obesity.  Needs oupt sleep eval.  5.  Morbid obesity:  Needs outpt nutritional counseling.  Signed, Murray Hodgkins, NP  12/05/2017, 9:31 AM    For questions or updates, please contact   Please consult [email or web address] for contact info under Cardiology/STEMI.  I have seen and examined the patient along with Ignacia Bayley, NP.  I have reviewed the chart, notes and new data.  I agree with PA/NP's note.  Key new complaints: After the brief twinges of chest discomfort early this morning he has not had any other chest  complaints.  Denies dyspnea. Key examination changes: Normal cardiovascular exam, although limited by body habitus Key new findings / data: Consistently in normal sinus rhythm on telemetry.  Perfusion images pending  PLAN: Switching from intravenous to oral diltiazem. Agree that we might need to consider true antiarrhythmic therapy.  Results of the nuclear stress test will influence the choice of agent.   Continue diuretics. We will follow-up on nuclear perfusion images once they are available.  Sanda Klein, MD, [place] [phone] 12/05/2017, 11:51 AM

## 2017-12-06 ENCOUNTER — Ambulatory Visit: Payer: Medicare Other | Admitting: Physician Assistant

## 2017-12-06 DIAGNOSIS — R079 Chest pain, unspecified: Secondary | ICD-10-CM

## 2017-12-06 LAB — CBC
HEMATOCRIT: 51 % (ref 39.0–52.0)
Hemoglobin: 16.8 g/dL (ref 13.0–17.0)
MCH: 29.5 pg (ref 26.0–34.0)
MCHC: 32.9 g/dL (ref 30.0–36.0)
MCV: 89.6 fL (ref 78.0–100.0)
PLATELETS: 346 10*3/uL (ref 150–400)
RBC: 5.69 MIL/uL (ref 4.22–5.81)
RDW: 14.1 % (ref 11.5–15.5)
WBC: 16.3 10*3/uL — AB (ref 4.0–10.5)

## 2017-12-06 LAB — BASIC METABOLIC PANEL
Anion gap: 8 (ref 5–15)
BUN: 21 mg/dL — ABNORMAL HIGH (ref 6–20)
CHLORIDE: 103 mmol/L (ref 101–111)
CO2: 26 mmol/L (ref 22–32)
CREATININE: 1.1 mg/dL (ref 0.61–1.24)
Calcium: 8.7 mg/dL — ABNORMAL LOW (ref 8.9–10.3)
Glucose, Bld: 108 mg/dL — ABNORMAL HIGH (ref 65–99)
POTASSIUM: 4.1 mmol/L (ref 3.5–5.1)
Sodium: 137 mmol/L (ref 135–145)

## 2017-12-06 LAB — T4, FREE: FREE T4: 0.81 ng/dL — AB (ref 0.82–1.77)

## 2017-12-06 LAB — TSH: TSH: 0.569 u[IU]/mL (ref 0.350–4.500)

## 2017-12-06 MED ORDER — METOPROLOL TARTRATE 12.5 MG HALF TABLET
12.5000 mg | ORAL_TABLET | Freq: Two times a day (BID) | ORAL | Status: DC
  Administered 2017-12-06 – 2017-12-08 (×5): 12.5 mg via ORAL
  Filled 2017-12-06 (×5): qty 1

## 2017-12-06 MED ORDER — DILTIAZEM HCL ER COATED BEADS 240 MG PO CP24
240.0000 mg | ORAL_CAPSULE | Freq: Every day | ORAL | Status: DC
  Administered 2017-12-06 – 2017-12-08 (×3): 240 mg via ORAL
  Filled 2017-12-06 (×3): qty 1

## 2017-12-06 NOTE — Care Management Important Message (Signed)
Important Message  Patient Details  Name: Wayne Booth MRN: [id number] Date of Birth: Oct 30, 1940   Medicare Important Message Given:  Yes    Atzel Mccambridge P Levette Paulick 12/06/2017, 3:32 PM

## 2017-12-06 NOTE — Care Management Note (Signed)
Case Management Note Marvetta Gibbons RN, BSN Unit 4E-Case Manager [phone]  Patient Details  Name: Wayne Booth MRN: [id number] Date of Birth: [birth year]  Subjective/Objective:  Pt admitted with afib and acute on chronic CHF                 Action/Plan: PTA pt lived at home with wife, anticipate return home. CM to follow for transition of care needs  Expected Discharge Date:  12/05/17               Expected Discharge Plan:  Home/Self Care  In-House Referral:     Discharge planning Services  CM Consult  Post Acute Care Choice:    Choice offered to:     DME Arranged:    DME Agency:     HH Arranged:    HH Agency:     Status of Service:  In process, will continue to follow  If discussed at Long Length of Stay Meetings, dates discussed:    Discharge Disposition:   Additional Comments:  Dawayne Patricia, RN 12/06/2017, 10:31 AM

## 2017-12-06 NOTE — Telephone Encounter (Signed)
See note.   Copied from [place] [id number]. Topic: Inquiry >> Dec 06, 2017  7:54 AM Pricilla Handler wrote: Reason for CRM: Patient called requesting a call back from [facility] (Dr. Ansel Bong Assistant). Patient states he needs to speak with her. Patient would like a call back today at [phone].        Thank You!!!

## 2017-12-06 NOTE — Telephone Encounter (Signed)
Called and spoke with patient who stated he was feeling better. He will schedule a follow up when he is released from the hospital.

## 2017-12-06 NOTE — Progress Notes (Signed)
[facility]                                                                              Patient Demographics  Wayne Booth, is a 77 y.o. male, DOB - 08-20-1940, YIR:[id number]  Admit date - 12/03/2017   Admitting Physician Louellen Molder, MD  Outpatient Primary MD for the patient is Yong Channel Brayton Mars, MD  Outpatient specialists:   LOS - 3  days   Medical records reviewed and are as summarized below:    Chief Complaint  Patient presents with  . Tachycardia  . Chest Pain       Brief summary   Patient is a 77 year old male with obesity, OSA, hypertension, intermittent dizziness due to orthostasis, history of PE treated with Xarelto in the past, renal cell CA status post nephrectomy, hospitalized a month back due to atrial for ablation with RVR.  Patient was doing well until 1 week ago when he started having pain and swelling in his right foot, saw his PCP a day before admission and was started on prednisone taper along with doxycycline for suspected gout versus cellulitis.  On the morning of admission, felt dizzy and unsteady, shortness of breath with sharp left-sided substernal chest pain.  In ED patient was found to be in rapid atrial for ablation with heart rate in 150s.  Patient was given IV Cardizem bolus 20 mg and placed on Cardizem drip with IV Lasix 20 mg.  Patient was admitted for further work-up.   Assessment & Plan    Principal Problem:   Atrial fibrillation with RVR (HCC) -Heart rate currently controlled, continue Cardizem drip -2D echo 11/02/2017 showed EF of 50 to 55% with grade 2 diastolic dysfunction. -Continue Eliquis -Nuclear medicine stress test with a remote infarct along the proximal septum, small area of reversible ischemia at the apex, mild hypokinesia along the septum, intermediate risk. -Atrial flutter this a.m., change to long-acting diltiazem, start metoprolol   Active Problems:  Chest pain: Likely worsened due to rapid atrial  fibrillation -Recent 2D echo showed EF of 50 to 55% with grade 2 diastolic dysfunction --Nuclear medicine stress test with a remote infarct along the proximal septum, small area of reversible ischemia at the apex, mild hypokinesia along the septum, intermediate risk. -No plans of cardiac cath at this time  Acute on chronic diastolic CHF exacerbation - Worsened due to rapid atrial fibrillation with RVR, decompensated fairly quickly hence will likely benefit from antiarrhythmic -Received Lasix 20 mg IV x1 in ED, then 43m IV x 1, then placed on oral Lasix, negative balance of 5.9 L.   -Lasix 20 mg daily, creatinine stable   morbid obesity ([place] -Counseled on diet and weight control -BMI 40.5    Essential hypertension -Currently stable, continue Cardizem, on low-dose Lasix    OSA (obstructive sleep apnea) -Per patient, being referred for sleep study outpatient    Hypothyroidism Continue Synthroid - recent TSH normal    History of pulmonary embolism -Completed Xarelto, continue Eliquis Recent VQ scan negative for PE    Acute gouty arthritis, left foot pain with mild cellulitis - improving -Continue prednisone 40 mg daily, was  on steroids prior to admission, will DC now  -Uric acid 8.3, CRP 1.3, ESR normal -Continue colchicine, will place on allopurinol 150 mg daily at discharge -Continue doxycycline   Code Status: Full code DVT Prophylaxis: Apixaban Family Communication: Discussed in detail with the patient, all imaging results, lab results explained to the patient   Disposition Plan: possible DC in am   Time Spent in minutes 25 minutes  Procedures:  2D echo 5/7 showed EF of 50 to 43%, grade 2 diastolic dysfunction  Consultants:   Cardiology  Antimicrobials:      Medications  Scheduled Meds: . apixaban  5 mg Oral BID  . colchicine  0.6 mg Oral Daily  . diltiazem  240 mg Oral Daily  . doxycycline  100 mg Oral BID  . escitalopram  20 mg Oral Daily  .  furosemide  20 mg Oral Daily  . levothyroxine  150 mcg Oral QAC breakfast  . metoprolol tartrate  12.5 mg Oral BID  . multivitamin with minerals  1 tablet Oral Daily  . nortriptyline  75 mg Oral QHS  . regadenoson  0.4 mg Intravenous Once  . regadenoson  0.4 mg Intravenous Once  . traZODone  50 mg Oral QHS   Continuous Infusions:  PRN Meds:.acetaminophen **OR** acetaminophen, albuterol, cyclobenzaprine, nitroGLYCERIN, ondansetron **OR** ondansetron (ZOFRAN) IV, senna-docusate, zolpidem   Antibiotics   Anti-infectives (From admission, onward)   Start     Dose/Rate Route Frequency Ordered Stop   12/03/17 2200  doxycycline (VIBRA-TABS) tablet 100 mg     100 mg Oral 2 times daily 12/03/17 1948          Subjective:   Wayne Booth was seen and examined today.  Overall improving, no chest pain or shortness of breath.  Atrial flutter this morning, denies any dizziness.  No abdominal pain, N/V/D/C, new weakness, numbess, tingling.  Objective:   Vitals:   12/05/17 1900 12/05/17 2320 12/06/17 0320 12/06/17 0442  BP: 132/76 128/70  132/85  Pulse:    96  Resp: (!) 32  18 16  Temp: 98.9 F (37.2 C)   97.8 F (36.6 C)  TempSrc: Oral   Oral  SpO2: 94%   95%  Weight:      Height:        Intake/Output Summary (Last 24 hours) at 12/06/2017 0953 Last data filed at 12/05/2017 2239 Gross per 24 hour  Intake -  Output 1420 ml  Net -1420 ml     Wt Readings from Last 3 Encounters:  12/03/17 131.8 kg (290 lb 8 oz)  12/02/17 132.5 kg (292 lb 3.2 oz)  11/11/17 133.4 kg (294 lb)     Exam   General: Alert and oriented x 3, NAD  Eyes:   HEENT:   Cardiovascular: S1 S2 auscultated, Regular rate and rhythm. trace pedal edema b/l  Respiratory: Fairly CTA B  Gastrointestinal: Soft, nontender, nondistended, + bowel sounds  Ext: trace pedal edema bilaterally  Neuro: no new deficits   Musculoskeletal: No digital cyanosis, clubbing  Skin: No rashes  Psych: Normal affect and  demeanor, alert and oriented x3    Data Reviewed:  I have personally reviewed following labs and imaging studies  Micro Results No results found for this or any previous visit (from the past 240 hour(s)).  Radiology Reports Nm Myocar Multi W/spect W/wall Motion / Ef  Result Date: 12/05/2017 CLINICAL DATA:  Chest pain.  Acute coronary syndrome suspected. EXAM: MYOCARDIAL IMAGING WITH SPECT (REST AND PHARMACOLOGIC-STRESS - 2 DAY  PROTOCOL) GATED LEFT VENTRICULAR WALL MOTION STUDY LEFT VENTRICULAR EJECTION FRACTION TECHNIQUE: Standard myocardial SPECT imaging was performed after resting intravenous injection of 30 mCi Tc-2mtetrofosmin. Subsequently, on a second day, intravenous infusion of Lexiscan was performed under the supervision of the Cardiology staff. At peak effect of the drug, 30 mCi Tc-939metrofosmin was injected intravenously and standard myocardial SPECT imaging was performed. Quantitative gated imaging was also performed to evaluate left ventricular wall motion, and estimate left ventricular ejection fraction. COMPARISON:  One-view chest x-ray 12/03/2017 FINDINGS: Perfusion: There is decreased uptake along the proximal septum on both rest and stress images. A small apical segment demonstrates some reversible ischemia with decreased uptake on stress images compared to the rest images. Wall Motion: A focal area of decreased wall motion is present along the septum. Wall motion and thickening is otherwise normal. Left Ventricular Ejection Fraction: 51 % End diastolic volume 11161l End systolic volume 56 ml IMPRESSION: 1. Decreased uptake along the proximal septum during both rest and stress images suggesting remote infarct. 2. Small area of reversible ischemia in the apex. 3. Mild hypokinesia along the septum. Wall motion and thickening is otherwise normal. 4. Left ventricular ejection fraction is 51%. 5. Noninvasive risk stratification*: Intermediate *2012 Appropriate Use Criteria for Coronary  Revascularization Focused Update: J Am Coll Cardiol. 200960;45(4):098-119http://content.[email or web address]?[email or web address] Electronically Signed   By: ChSan Morelle.D.   On: 12/05/2017 14:02   Dg Chest Portable 1 View  Result Date: 12/03/2017 CLINICAL DATA:  Tachycardia and chest pain. EXAM: PORTABLE CHEST 1 VIEW COMPARISON:  11/01/2017 FINDINGS: Mild cardiomegaly. Aortic atherosclerosis. Poor inspiration. Question small left pleural effusion with basilar atelectasis. IMPRESSION: Question small left effusion with basilar atelectasis. Electronically Signed   By: MaNelson Chimes.D.   On: 12/03/2017 15:57    Lab Data:  CBC: Recent Labs  Lab 12/03/17 1539 12/06/17 0609  WBC 19.2* 16.3*  HGB 15.5 16.8  HCT 46.7 51.0  MCV 89.3 89.6  PLT 358 [id number] Basic Metabolic Panel: Recent Labs  Lab 12/03/17 1539 12/04/17 0747 12/06/17 0609  NA 134* 136 137  K 5.2* 4.2 4.1  CL 103 102 103  CO2 '22 27 26  ' GLUCOSE 140* 96 108*  BUN 18 17 21*  CREATININE 1.22 1.20 1.10  CALCIUM 8.9 8.6* 8.7*   GFR: Estimated Creatinine Clearance: 80.3 mL/min (by C-G formula based on SCr of 1.1 mg/dL). Liver Function Tests: No results for input(s): AST, ALT, ALKPHOS, BILITOT, PROT, ALBUMIN in the last 168 hours. No results for input(s): LIPASE, AMYLASE in the last 168 hours. No results for input(s): AMMONIA in the last 168 hours. Coagulation Profile: No results for input(s): INR, PROTIME in the last 168 hours. Cardiac Enzymes: Recent Labs  Lab 12/03/17 2004 12/04/17 0242 12/04/17 0747  TROPONINI <0.03 0.06* <0.03   BNP (last 3 results) No results for input(s): PROBNP in the last 8760 hours. HbA1C: No results for input(s): HGBA1C in the last 72 hours. CBG: No results for input(s): GLUCAP in the last 168 hours. Lipid Profile: No results for input(s): CHOL, HDL, LDLCALC, TRIG, CHOLHDL, LDLDIRECT in the last 72 hours. Thyroid Function Tests: No results for input(s): TSH, T4TOTAL,  FREET4, T3FREE, THYROIDAB in the last 72 hours. Anemia Panel: No results for input(s): VITAMINB12, FOLATE, FERRITIN, TIBC, IRON, RETICCTPCT in the last 72 hours. Urine analysis:    Component Value Date/Time   COLORURINE YELLOW 11/02/2017 03[place]5/12/2017 0347   LABSPEC 1.011 11/02/2017 03[place]  5.0 11/02/2017 0347   GLUCOSEU NEGATIVE 11/02/2017 0347   HGBUR NEGATIVE 11/02/2017 0347   HGBUR 2+ 09/22/2007 1444   BILIRUBINUR NEGATIVE 11/02/2017 0347   BILIRUBINUR n 11/07/2015 1417   [place] 11/02/2017 0347   PROTEINUR NEGATIVE 11/02/2017 0347   UROBILINOGEN 1.0 11/07/2015 1417   UROBILINOGEN 1.0 05/22/2011 1454   NITRITE NEGATIVE 11/02/2017 0347   LEUKOCYTESUR NEGATIVE 11/02/2017 0347     Ahjanae Cassel M.D. [facility] 12/06/2017, 9:53 AM  Pager: [phone] Between 7am to 7pm - call Pager - [phone]  After 7pm go to [email or web address] - password [email or web address]  Call night coverage person covering after 7pm

## 2017-12-06 NOTE — Progress Notes (Signed)
Progress Note  Patient Name: Wayne Booth Date of Encounter: 12/06/2017  Primary Cardiologist: Larae Grooms, MD   Subjective   Feels well this morning. No chest pain or SOB. Laying flat and appears comfortable in bed. Reports good UOP with IV lasix yesterday. Experienced an episode of diaphoresis overnight that he is very concerned about.   Inpatient Medications    Scheduled Meds: . apixaban  5 mg Oral BID  . colchicine  0.6 mg Oral Daily  . diltiazem  60 mg Oral Q6H  . doxycycline  100 mg Oral BID  . escitalopram  20 mg Oral Daily  . furosemide  20 mg Oral Daily  . levothyroxine  150 mcg Oral QAC breakfast  . multivitamin with minerals  1 tablet Oral Daily  . nortriptyline  75 mg Oral QHS  . predniSONE  40 mg Oral Q breakfast  . regadenoson  0.4 mg Intravenous Once  . regadenoson  0.4 mg Intravenous Once  . traZODone  50 mg Oral QHS   Continuous Infusions:  PRN Meds: acetaminophen **OR** acetaminophen, albuterol, cyclobenzaprine, nitroGLYCERIN, ondansetron **OR** ondansetron (ZOFRAN) IV, senna-docusate, zolpidem   Vital Signs    Vitals:   12/05/17 1900 12/05/17 2320 12/06/17 0320 12/06/17 0442  BP: 132/76 128/70  132/85  Pulse:    96  Resp: (!) 32  18 16  Temp: 98.9 F (37.2 C)   97.8 F (36.6 C)  TempSrc: Oral   Oral  SpO2: 94%   95%  Weight:      Height:        Intake/Output Summary (Last 24 hours) at 12/06/2017 0833 Last data filed at 12/05/2017 2239 Gross per 24 hour  Intake -  Output 1420 ml  Net -1420 ml   Filed Weights   12/03/17 1527 12/03/17 1947  Weight: 292 lb (132.5 kg) 290 lb 8 oz (131.8 kg)    Telemetry    Atrial flutter with 3:1 conduction - Personally Reviewed  ECG    None new; 12/04/17 sinus rhythm, borderline prolonged PR interval - Personally Reviewed  Physical Exam   GEN: Obese, No acute distress.   Neck: No JVD Cardiac: RRR, no murmurs, rubs, or gallops.  Respiratory: Clear to auscultation bilaterally. GI: Soft,  nontender, non-distended  MS: warm extremities, trace LE edema; No deformity. Neuro:  Nonfocal  Psych: Normal affect   Labs    Chemistry Recent Labs  Lab 12/03/17 1539 12/04/17 0747 12/06/17 0609  NA 134* 136 137  K 5.2* 4.2 4.1  CL 103 102 103  CO2 '22 27 26  '$ GLUCOSE 140* 96 108*  BUN 18 17 21*  CREATININE 1.22 1.20 1.10  CALCIUM 8.9 8.6* 8.7*  GFRNONAA 56* 57* >60  GFRAA >60 >60 >60  ANIONGAP '9 7 8     '$ Hematology Recent Labs  Lab 12/03/17 1539 12/06/17 0609  WBC 19.2* 16.3*  RBC 5.23 5.69  HGB 15.5 16.8  HCT 46.7 51.0  MCV 89.3 89.6  MCH 29.6 29.5  MCHC 33.2 32.9  RDW 14.2 14.1  PLT 358 346    Cardiac Enzymes Recent Labs  Lab 12/03/17 2004 12/04/17 0242 12/04/17 0747  TROPONINI <0.03 0.06* <0.03    Recent Labs  Lab 12/03/17 1546  TROPIPOC 0.00     BNP Recent Labs  Lab 12/03/17 1539  BNP 271.9*     DDimer No results for input(s): DDIMER in the last 168 hours.   Radiology    Nm Myocar Multi W/spect W/wall Motion / Ef  Result Date: 12/05/2017 CLINICAL DATA:  Chest pain.  Acute coronary syndrome suspected. EXAM: MYOCARDIAL IMAGING WITH SPECT (REST AND PHARMACOLOGIC-STRESS - 2 DAY PROTOCOL) GATED LEFT VENTRICULAR WALL MOTION STUDY LEFT VENTRICULAR EJECTION FRACTION TECHNIQUE: Standard myocardial SPECT imaging was performed after resting intravenous injection of 30 mCi Tc-30mtetrofosmin. Subsequently, on a second day, intravenous infusion of Lexiscan was performed under the supervision of the Cardiology staff. At peak effect of the drug, 30 mCi Tc-979metrofosmin was injected intravenously and standard myocardial SPECT imaging was performed. Quantitative gated imaging was also performed to evaluate left ventricular wall motion, and estimate left ventricular ejection fraction. COMPARISON:  One-view chest x-ray 12/03/2017 FINDINGS: Perfusion: There is decreased uptake along the proximal septum on both rest and stress images. A small apical segment  demonstrates some reversible ischemia with decreased uptake on stress images compared to the rest images. Wall Motion: A focal area of decreased wall motion is present along the septum. Wall motion and thickening is otherwise normal. Left Ventricular Ejection Fraction: 51 % End diastolic volume 11867l End systolic volume 56 ml IMPRESSION: 1. Decreased uptake along the proximal septum during both rest and stress images suggesting remote infarct. 2. Small area of reversible ischemia in the apex. 3. Mild hypokinesia along the septum. Wall motion and thickening is otherwise normal. 4. Left ventricular ejection fraction is 51%. 5. Noninvasive risk stratification*: Intermediate *2012 Appropriate Use Criteria for Coronary Revascularization Focused Update: J Am Coll Cardiol. 206195;09(3):267-124http://content.[email or web address]?[email or web address] Electronically Signed   By: ChSan Morelle.D.   On: 12/05/2017 14:02    Cardiac Studies   12/04/2017 NM Stress:  IMPRESSION: 1. Decreased uptake along the proximal septum during both rest and stress images suggesting remote infarct. 2. Small area of reversible ischemia in the apex. 3. Mild hypokinesia along the septum. Wall motion and thickening is otherwise normal. 4. Left ventricular ejection fraction is 51%. 5. Noninvasive risk stratification*: Intermediate  Patient Profile     [age].o. 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   Assessment & Plan    Paroxysmal Atrial Fibrillation: Converted to sinus 6/7 on diltiazem drip, in and out of atrial flutter this AM after being switched to PO diltiazem yesterday. Rate controlled.  -- Continue Eliquis 5 mg BID -- Consolidate diltiazem 60 mg q6h -> 240 mg XL q24 hours --  Start metoprolol 12.5 mg BID  Chest Pain: Atypical, likely related to PAF / flutter.  -- NM stress yesterday with evidence of remote infarct along the proximal septum, small area of reversible ischemia at the apex, and mild hypokinesia along the septum. Overall Intermediate / low risk  -- Will hold off on LHC for now  Acute on Chronic dCHF: Received IV lasix 60 mg yesterday. I/Os inaccurate, weight not recorded. Euvolemic today.  -- Continue lasix 20 mg daily   Gout: Currently on prednisone 40 mg for flair. Uric acid 8.3, CRP 1.3, ESR normal. -- Continue colchicine and allopurinol  -- Recommend tapering prednisone in the setting of atrial flutter and heart failure  Hypothyroid:  -- Continue synthroid -- Recheck TSH, free T4  Hx of PE: -- Continue Eliquis   For questions or updates, please contact [facility] Please consult [email or web address] for contact info under Cardiology/STEMI.      Signed, CaVelna OchsMD  12/06/2017, 8:33  AM

## 2017-12-06 NOTE — Consult Note (Addendum)
   [facility] Inpatient Consult   12/06/2017  Wayne Booth [birth year] [id number]   Patient screened for [place] [facility] services due to readmission and risk of unplanned readmission score of 24% (high).  Spoke with Wayne Booth at bedside to discuss Wayne Booth program. He is agreeable and [facility] written consent obtained.   Wayne Booth endorses he lives with his wife. Denies having any concerns with medications or with transportation. Confirmed Primary Care Provider is Dr. Yong Channel [facility] practice is listed as doing transition of care call post discharge). Confirmed best contact number for Wayne Booth is [phone].  Discussed [facility] Booth follow up for CHF. Although he denies having CHF. States he has a scale but does not weigh daily.  He is admitted with AFIB with RVR.  Made inpatient RNCM aware that [place] Booth will follow.   Will make referral to [facility] for CHF.     Wayne Rolling, MSN-Ed, RN,BSN [facility] Liaison [phone]

## 2017-12-07 ENCOUNTER — Inpatient Hospital Stay (HOSPITAL_COMMUNITY): Payer: Medicare Other

## 2017-12-07 DIAGNOSIS — M109 Gout, unspecified: Secondary | ICD-10-CM

## 2017-12-07 DIAGNOSIS — I4892 Unspecified atrial flutter: Secondary | ICD-10-CM

## 2017-12-07 LAB — BASIC METABOLIC PANEL
ANION GAP: 7 (ref 5–15)
BUN: 22 mg/dL — ABNORMAL HIGH (ref 6–20)
CHLORIDE: 101 mmol/L (ref 101–111)
CO2: 30 mmol/L (ref 22–32)
CREATININE: 1.22 mg/dL (ref 0.61–1.24)
Calcium: 8.9 mg/dL (ref 8.9–10.3)
GFR calc non Af Amer: 56 mL/min — ABNORMAL LOW (ref >60)
Glucose, Bld: 119 mg/dL — ABNORMAL HIGH (ref 65–99)
POTASSIUM: 4.3 mmol/L (ref 3.5–5.1)
SODIUM: 138 mmol/L (ref 135–145)

## 2017-12-07 MED ORDER — METOPROLOL TARTRATE 25 MG PO TABS: 12.5000 mg | ORAL_TABLET | Freq: Two times a day (BID) | ORAL | 3 refills | Status: DC

## 2017-12-07 MED ORDER — COLCHICINE 0.6 MG PO TABS: 0.6000 mg | ORAL_TABLET | Freq: Every day | ORAL | 3 refills | Status: DC

## 2017-12-07 MED ORDER — DOXYCYCLINE HYCLATE 100 MG PO TABS: 100.0000 mg | ORAL_TABLET | Freq: Two times a day (BID) | ORAL | 0 refills | Status: AC

## 2017-12-07 MED ORDER — NITROGLYCERIN 0.4 MG SL SUBL: 0.4000 mg | SUBLINGUAL_TABLET | SUBLINGUAL | 12 refills | Status: DC | PRN

## 2017-12-07 MED ORDER — FUROSEMIDE 20 MG PO TABS: 20.0000 mg | ORAL_TABLET | Freq: Every day | ORAL | 3 refills | Status: DC

## 2017-12-07 MED ORDER — ALLOPURINOL 300 MG PO TABS: 150.0000 mg | ORAL_TABLET | Freq: Every day | ORAL | 3 refills | Status: DC

## 2017-12-07 MED ORDER — DILTIAZEM HCL ER COATED BEADS 240 MG PO CP24: 240.0000 mg | ORAL_CAPSULE | Freq: Every day | ORAL | 3 refills | Status: DC

## 2017-12-07 MED ORDER — ALLOPURINOL 300 MG PO TABS
150.0000 mg | ORAL_TABLET | Freq: Every day | ORAL | Status: DC
  Administered 2017-12-07 – 2017-12-08 (×2): 150 mg via ORAL
  Filled 2017-12-07 (×2): qty 1

## 2017-12-07 NOTE — Care Management Note (Signed)
Case Management Note Marvetta Gibbons RN, BSN Unit 4E-Case Manager [phone]  Patient Details  Name: Wayne Booth MRN: [id number] Date of Birth: [birth year]  Subjective/Objective:  Pt admitted with afib and acute on chronic CHF                 Action/Plan: PTA pt lived at home with wife, anticipate return home. CM to follow for transition of care needs  Expected Discharge Date:  12/07/17               Expected Discharge Plan:  Home/Self Care  In-House Referral:     Discharge planning Services  CM Consult  Post Acute Care Choice:    Choice offered to:     DME Arranged:    DME Agency:     HH Arranged:    [place] Agency:     Status of Service:  Completed, signed off  If discussed at [employer] of Stay Meetings, dates discussed:    Discharge Disposition: home/self care   Additional Comments:  12/07/17- 1500- Havannah Streat RN, CM- pt for transition home today- no transition of care needs noted.   Dawayne Patricia, RN 12/07/2017, 3:19 PM

## 2017-12-07 NOTE — Progress Notes (Signed)
Noted new swelling R lateral neck base.  No redness/ tenderness.  Spouse made aware and stated that the swelling was definitely new.  MD notified

## 2017-12-07 NOTE — Progress Notes (Signed)
Progress Note  Patient Name: Wayne Booth Date of Encounter: 12/07/2017  Primary Cardiologist: Larae Grooms, MD   Subjective   Was seen walking the halls this morning, short of breath with ambulation. Denies any further episodes of diaphoresis, no palpitations. Tremor has resolved with metoprolol.   Inpatient Medications    Scheduled Meds: . allopurinol  150 mg Oral Daily  . apixaban  5 mg Oral BID  . colchicine  0.6 mg Oral Daily  . diltiazem  240 mg Oral Daily  . doxycycline  100 mg Oral BID  . escitalopram  20 mg Oral Daily  . furosemide  20 mg Oral Daily  . levothyroxine  150 mcg Oral QAC breakfast  . metoprolol tartrate  12.5 mg Oral BID  . multivitamin with minerals  1 tablet Oral Daily  . nortriptyline  75 mg Oral QHS  . regadenoson  0.4 mg Intravenous Once  . regadenoson  0.4 mg Intravenous Once  . traZODone  50 mg Oral QHS   Continuous Infusions:  PRN Meds: acetaminophen **OR** acetaminophen, albuterol, cyclobenzaprine, nitroGLYCERIN, ondansetron **OR** ondansetron (ZOFRAN) IV, senna-docusate, zolpidem   Vital Signs    Vitals:   12/06/17 1224 12/06/17 2026 12/07/17 0358 12/07/17 0833  BP: 137/77 (!) 156/73 (!) 148/88 136/86  Pulse: 83 94 71 65  Resp: (!) 27  14   Temp: 98 F (36.7 C) 98.2 F (36.8 C) 98.8 F (37.1 C)   TempSrc: Oral Oral Oral   SpO2: 98% 94% 96%   Weight:   291 lb 1.6 oz (132 kg)   Height:        Intake/Output Summary (Last 24 hours) at 12/07/2017 0925 Last data filed at 12/07/2017 0800 Gross per 24 hour  Intake 600 ml  Output 2950 ml  Net -2350 ml   Filed Weights   12/03/17 1947 12/06/17 0900 12/07/17 0358  Weight: 290 lb 8 oz (131.8 kg) 292 lb 12.3 oz (132.8 kg) 291 lb 1.6 oz (132 kg)    Telemetry    Atrial flutter with 3:1 conduction - Personally Reviewed  ECG    None new; 12/04/17 sinus rhythm, borderline prolonged PR interval - Personally Reviewed  Physical Exam   GEN: Obese, No acute distress.   Neck: No  JVD Cardiac: RRR, no murmurs, rubs, or gallops.  Respiratory: Clear to auscultation bilaterally. GI: Soft, nontender, non-distended  MS: warm extremities, trace LE edema; No deformity. Neuro:  Nonfocal  Psych: Normal affect   Labs    Chemistry Recent Labs  Lab 12/04/17 0747 12/06/17 0609 12/07/17 0421  NA 136 137 138  K 4.2 4.1 4.3  CL 102 103 101  CO2 _0 GLUCOSE 96 108* 119*  BUN 17 21* 22*  CREATININE 1.20 1.10 1.22  CALCIUM 8.6* 8.7* 8.9  GFRNONAA 57* >60 56*  GFRAA >60 >60 >60  ANIONGAP _1 Hematology Recent Labs  Lab 12/03/17 1539 12/06/17 0609  WBC 19.2* 16.3*  RBC 5.23 5.69  HGB 15.5 16.8  HCT 46.7 51.0  MCV 89.3 89.6  MCH 29.6 29.5  MCHC 33.2 32.9  RDW 14.2 14.1  PLT 358 346    Cardiac Enzymes Recent Labs  Lab 12/03/17 2004 12/04/17 0242 12/04/17 0747  TROPONINI <0.03 0.06* <0.03    Recent Labs  Lab 12/03/17 1546  TROPIPOC 0.00     BNP Recent Labs  Lab 12/03/17 1539  BNP 271.9*     DDimer No results for input(s): DDIMER in  the last 168 hours.   Radiology    Nm Myocar Multi W/spect W/wall Motion / Ef  Result Date: 12/05/2017 CLINICAL DATA:  Chest pain.  Acute coronary syndrome suspected. EXAM: MYOCARDIAL IMAGING WITH SPECT (REST AND PHARMACOLOGIC-STRESS - 2 DAY PROTOCOL) GATED LEFT VENTRICULAR WALL MOTION STUDY LEFT VENTRICULAR EJECTION FRACTION TECHNIQUE: Standard myocardial SPECT imaging was performed after resting intravenous injection of 30 mCi Tc-46mtetrofosmin. Subsequently, on a second day, intravenous infusion of Lexiscan was performed under the supervision of the Cardiology staff. At peak effect of the drug, 30 mCi Tc-99metrofosmin was injected intravenously and standard myocardial SPECT imaging was performed. Quantitative gated imaging was also performed to evaluate left ventricular wall motion, and estimate left ventricular ejection fraction. COMPARISON:  One-view chest x-ray 12/03/2017 FINDINGS: Perfusion: There  is decreased uptake along the proximal septum on both rest and stress images. A small apical segment demonstrates some reversible ischemia with decreased uptake on stress images compared to the rest images. Wall Motion: A focal area of decreased wall motion is present along the septum. Wall motion and thickening is otherwise normal. Left Ventricular Ejection Fraction: 51 % End diastolic volume 11604l End systolic volume 56 ml IMPRESSION: 1. Decreased uptake along the proximal septum during both rest and stress images suggesting remote infarct. 2. Small area of reversible ischemia in the apex. 3. Mild hypokinesia along the septum. Wall motion and thickening is otherwise normal. 4. Left ventricular ejection fraction is 51%. 5. Noninvasive risk stratification*: Intermediate *2012 Appropriate Use Criteria for Coronary Revascularization Focused Update: J Am Coll Cardiol. 205409;81(1):914-782http://content.[email or web address]?[email or web address] Electronically Signed   By: ChSan Morelle.D.   On: 12/05/2017 14:02    Cardiac Studies   12/04/2017 NM Stress:  IMPRESSION: 1. Decreased uptake along the proximal septum during both rest and stress images suggesting remote infarct. 2. Small area of reversible ischemia in the apex. 3. Mild hypokinesia along the septum. Wall motion and thickening is otherwise normal. 4. Left ventricular ejection fraction is 51%. 5. Noninvasive risk stratification*: Intermediate  Patient Profile     [age].o. 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   Assessment & Plan    Paroxysmal Atrial Fibrillation: Converted to sinus 6/7 on diltiazem drip and transitioned to PO dilt. Had episode of atrial flutter yesterday, metoprolol 12.5 mg BID added  to regimen. Has remained in sinus overnight and today.  -- Continue Eliquis 5 mg BID -- Continue diltiazem 240 mg XL q24 hours -- Continue metoprolol 12.5 mg BID -- Ok for discharge, will arrange follow up  Chest Pain: Atypical, likely related to PAF / flutter.  -- NM stress yesterday with evidence of remote infarct along the proximal septum, small area of reversible ischemia at the apex, and mild hypokinesia along the septum. Overall Intermediate / low risk  -- Will hold off on LHC for now  Acute on Chronic dCHF: Euvolemic s/p IV lasix on admission. Stable on home PO lasix since yesterday.  -- Continue lasix 20 mg daily   Gout: Currently on prednisone 40 mg for flair. Uric acid 8.3, CRP 1.3, ESR normal. -- Continue colchicine and allopurinol  -- Recommend tapering prednisone in the setting of atrial flutter and heart failure  Hypothyroid:  -- Continue synthroid -- Repeat TSH lower limit of normal, Free T4 very mildly  decreased 0.81  -- Would repeat labs 6 weeks and consider increase in synthroid  Hx of PE: -- Continue Eliquis   For questions or updates, please contact [place] Please consult [email or web address] for contact info under Cardiology/STEMI.      Signed, Velna Ochs, MD  12/07/2017, 9:25 AM

## 2017-12-07 NOTE — Progress Notes (Signed)
Nutrition Education Note  RD consulted (verbal) for nutrition education regarding a Heart Healthy diet.   Lipid Panel     Component Value Date/Time   CHOL 141 04/12/2017 0833   TRIG 168.0 (H) 04/12/2017 0833   HDL 38.20 (L) 04/12/2017 0833   CHOLHDL 4 04/12/2017 0833   VLDL 33.6 04/12/2017 0833   LDLCALC 69 04/12/2017 0833   RD provided "Heart Healthy Nutrition Therapy" handout from the Academy of Nutrition and Dietetics. Reviewed patient's dietary recall. Provided examples on ways to decrease sodium and fat intake in diet. Discouraged intake of processed foods and use of salt shaker. Encouraged fresh fruits and vegetables as well as whole grain sources of carbohydrates to maximize fiber intake. Teach back method used.  Expect good compliance.  Body mass index is 40.6 kg/m. Pt meets criteria for Obesity Class III based on current BMI.  Pt is discharging today.  Arthur Holms, RD, LDN Pager #: [phone] After-Hours Pager #: [phone]

## 2017-12-07 NOTE — Progress Notes (Signed)
Verbal order from Dr. Tana Coast for STAT ultrasound of R lateral neck.

## 2017-12-07 NOTE — Discharge Summary (Signed)
Physician Discharge Summary   Patient ID: Wayne Booth MRN: [id number] DOB/AGE: [birth year] 77 y.o.  Admit date: 12/03/2017 Discharge date: 12/07/2017  Primary Care Physician:  Wayne Olp, MD   Recommendations for Outpatient Follow-up:  1. Follow up with PCP in 1-2 weeks  Home Health: None  Equipment/Devices: None  Discharge Condition: stable  CODE STATUS: FULL Diet recommendation: Heart healthy diet   Discharge Diagnoses:    . Atrial fibrillation with RVR ([place]) . OSA (obstructive sleep apnea) . Morbid obesity ([place]) . Hypothyroidism . History of pulmonary embolism . Essential hypertension . Hyperkalemia . Acute gouty arthritis Chest pain Acute on chronic diastolic CHF exacerbation  Consults cardiology    Allergies:  No Known Allergies   DISCHARGE MEDICATIONS: Allergies as of 12/07/2017   No Known Allergies     Medication List    STOP taking these medications   losartan 25 MG tablet Commonly known as:  COZAAR   methylPREDNISolone 4 MG Tbpk tablet Commonly known as:  MEDROL DOSEPAK     TAKE these medications   albuterol 108 (90 Base) MCG/ACT inhaler Commonly known as:  PROVENTIL HFA;VENTOLIN HFA Inhale 2 puffs into the lungs every 6 (six) hours as needed for wheezing or shortness of breath.   allopurinol 300 MG tablet Commonly known as:  ZYLOPRIM Take 0.5 tablets (150 mg total) by mouth daily.   apixaban 5 MG Tabs tablet Commonly known as:  ELIQUIS Take 1 tablet (5 mg total) by mouth 2 (two) times daily.   budesonide-formoterol 160-4.5 MCG/ACT inhaler Commonly known as:  SYMBICORT Inhale 2 puffs into the lungs 2 (two) times daily as needed (for respiratory flares).   clotrimazole-betamethasone cream Commonly known as:  LOTRISONE Apply 1 application topically 2 (two) times daily. Use for 10 days maximum. What changed:    when to take this  reasons to take this  additional instructions   colchicine 0.6 MG tablet Take 1 tablet  (0.6 mg total) by mouth daily.   cyclobenzaprine 10 MG tablet Commonly known as:  FLEXERIL Take 10 mg by mouth daily as needed for muscle spasms.   diltiazem 240 MG 24 hr capsule Commonly known as:  CARDIZEM CD Take 1 capsule (240 mg total) by mouth daily. What changed:    medication strength  how much to take   doxycycline 100 MG tablet Commonly known as:  VIBRA-TABS Take 1 tablet (100 mg total) by mouth 2 (two) times daily for 5 days. Stop date on 6/16 What changed:  additional instructions   escitalopram 20 MG tablet Commonly known as:  LEXAPRO Take 1 tablet (20 mg total) by mouth daily. TAKE 20 mg every day What changed:  additional instructions   furosemide 20 MG tablet Commonly known as:  LASIX Take 1 tablet (20 mg total) by mouth daily.   levothyroxine 150 MCG tablet Commonly known as:  SYNTHROID, LEVOTHROID TAKE ONE TABLET BY MOUTH DAILY   metoprolol tartrate 25 MG tablet Commonly known as:  LOPRESSOR Take 0.5 tablets (12.5 mg total) by mouth 2 (two) times daily.   multivitamins ther. w/minerals Tabs tablet Take 1 tablet by mouth daily.   nitroGLYCERIN 0.4 MG SL tablet Commonly known as:  NITROSTAT Place 1 tablet (0.4 mg total) under the tongue every 5 (five) minutes as needed for chest pain.   nortriptyline 75 MG capsule Commonly known as:  PAMELOR TAKE ONE CAPSULE BY MOUTH AT BEDTIME   traZODone 50 MG tablet Commonly known as:  DESYREL TAKE ONE HALF TO ONE  TABLET BY MOUTH EVERY NIGHT AT BEDTIME AS NEEDED FOR SLEEP What changed:    how much to take  how to take this  when to take this  additional instructions        Brief H and P: For complete details please refer to admission H and P, but in brief Patient is a 77 year old male with obesity, OSA, hypertension, intermittent dizziness due to orthostasis, history of PE treated with Xarelto in the past, renal cell CA status post nephrectomy, hospitalized a month back due to atrial for ablation  with RVR.  Patient was doing well until 1 week ago when he started having pain and swelling in his right foot, saw his PCP a day before admission and was started on prednisone taper along with doxycycline for suspected gout versus cellulitis.  On the morning of admission, felt dizzy and unsteady, shortness of breath with sharp left-sided substernal chest pain.  In ED patient was found to be in rapid atrial for ablation with heart rate in 150s.  Patient was given IV Cardizem bolus 20 mg and placed on Cardizem drip with IV Lasix 20 mg.  Patient was admitted for further work-up.   Hospital Course:   Atrial fibrillation with RVR ([facility]) -Rate currently controlled, patient was placed on Cardizem drip after bolus at the time of admission. -2D echo 11/02/2017 showed EF of 50 to 55% with grade 2 diastolic dysfunction. -Nuclear medicine stress test with a remote infarct along the proximal septum, small area of reversible ischemia at the apex, mild hypokinesia along the septum, intermediate risk. -Had intermittent episode of atrial flutter on 6/10, metoprolol was added to the regimen, remains in normal sinus rhythm -Cardiology recommended continue diltiazem increased to 247m XL daily, metoprolol 12.5 mg twice a day, Eliquis 5 mg twice a day  Chest pain: Likely worsened due to rapid atrial fibrillation -Recent 2D echo showed EF of 50 to 55% with grade 2 diastolic dysfunction --Nuclear medicine stress test with a remote infarct along the proximal septum, small area of reversible ischemia at the apex, mild hypokinesia along the septum, intermediate risk. -No plans of cardiac cath at this time -Outpatient follow-up with cardiology  Acute on chronic diastolic CHF exacerbation - Worsened due to rapid atrial fibrillation with RVR, decompensated fairly quickly hence will likely benefit from antiarrhythmic -Received Lasix 20 mg IV x1 in ED, then 614mIV x 1, then placed on oral Lasix, negative balance of 5.9 L.    -Continue Lasix 20 mg daily, creatinine stable.   [place]bid obesity [place]-Counseled on diet and weight control -BMI 40.5    Essential hypertension -Currently stable, continue Cardizem, on low-dose Lasix    OSA (obstructive sleep apnea) -Per patient, being referred for sleep study outpatient    Hypothyroidism Continue Synthroid - recent TSH normal    History of pulmonary embolism -Completed Xarelto, continue Eliquis Recent VQ scan negative for PE    Acute gouty arthritis, left foot pain with mild cellulitis - improving -Continue prednisone 40 mg daily, was on steroids prior to admission, will DC now  -Uric acid 8.3, CRP 1.3, ESR normal -Continue colchicine, will place on allopurinol 150 mg daily at discharge -Continue doxycycline, does not need any further steroids.   Day of Discharge S: No complaints, feeling better, hoping to go home today  BP 136/86   Pulse 65   Temp 98.8 F (37.1 C) (Oral)   Resp 14   Ht _0  (1.803 m)   Wt 132 kg (291  lb 1.6 oz)   SpO2 96%   BMI 40.60 kg/m   Physical Exam: General: Alert and awake oriented x3 not in any acute distress. HEENT: anicteric sclera, pupils reactive to light and accommodation CVS: S1-S2 clear no murmur rubs or gallops Chest: clear to auscultation bilaterally, no wheezing rales or rhonchi Abdomen: soft nontender, nondistended, normal bowel sounds Extremities: no cyanosis, clubbing, trace lower extremity edema noted bilaterally Neuro: Cranial nerves II-XII intact, no focal neurological deficits   The results of significant diagnostics from this hospitalization (including imaging, microbiology, ancillary and laboratory) are listed below for reference.      Procedures/Studies:  Nm Myocar Multi W/spect W/wall Motion / Ef  Result Date: 12/05/2017 CLINICAL DATA:  Chest pain.  Acute coronary syndrome suspected. EXAM: MYOCARDIAL IMAGING WITH SPECT (REST AND PHARMACOLOGIC-STRESS - 2 DAY PROTOCOL) GATED LEFT  VENTRICULAR WALL MOTION STUDY LEFT VENTRICULAR EJECTION FRACTION TECHNIQUE: Standard myocardial SPECT imaging was performed after resting intravenous injection of 30 mCi Tc-6mtetrofosmin. Subsequently, on a second day, intravenous infusion of Lexiscan was performed under the supervision of the Cardiology staff. At peak effect of the drug, 30 mCi Tc-967metrofosmin was injected intravenously and standard myocardial SPECT imaging was performed. Quantitative gated imaging was also performed to evaluate left ventricular wall motion, and estimate left ventricular ejection fraction. COMPARISON:  One-view chest x-ray 12/03/2017 FINDINGS: Perfusion: There is decreased uptake along the proximal septum on both rest and stress images. A small apical segment demonstrates some reversible ischemia with decreased uptake on stress images compared to the rest images. Wall Motion: A focal area of decreased wall motion is present along the septum. Wall motion and thickening is otherwise normal. Left Ventricular Ejection Fraction: 51 % End diastolic volume 11378l End systolic volume 56 ml IMPRESSION: 1. Decreased uptake along the proximal septum during both rest and stress images suggesting remote infarct. 2. Small area of reversible ischemia in the apex. 3. Mild hypokinesia along the septum. Wall motion and thickening is otherwise normal. 4. Left ventricular ejection fraction is 51%. 5. Noninvasive risk stratification*: Intermediate *2012 Appropriate Use Criteria for Coronary Revascularization Focused Update: J Am Coll Cardiol. 205885;02(7):741-287http://content.[email or web address]?[email or web address] Electronically Signed   By: ChSan Morelle.D.   On: 12/05/2017 14:02   Dg Chest Portable 1 View  Result Date: 12/03/2017 CLINICAL DATA:  Tachycardia and chest pain. EXAM: PORTABLE CHEST 1 VIEW COMPARISON:  11/01/2017 FINDINGS: Mild cardiomegaly. Aortic atherosclerosis. Poor inspiration. Question small left pleural  effusion with basilar atelectasis. IMPRESSION: Question small left effusion with basilar atelectasis. Electronically Signed   By: MaNelson Chimes.D.   On: 12/03/2017 15:57       LAB RESULTS: Basic Metabolic Panel: Recent Labs  Lab 12/06/17 0609 12/07/17 0421  NA 137 138  K 4.1 4.3  CL 103 101  CO2 26 30  GLUCOSE 108* 119*  BUN 21* 22*  CREATININE 1.10 1.22  CALCIUM 8.7* 8.9   Liver Function Tests: No results for input(s): AST, ALT, ALKPHOS, BILITOT, PROT, ALBUMIN in the last 168 hours. No results for input(s): LIPASE, AMYLASE in the last 168 hours. No results for input(s): AMMONIA in the last 168 hours. CBC: Recent Labs  Lab 12/03/17 1539 12/06/17 0609  WBC 19.2* 16.3*  HGB 15.5 16.8  HCT 46.7 51.0  MCV 89.3 89.6  PLT 358 346   Cardiac Enzymes: Recent Labs  Lab 12/04/17 0242 12/04/17 0747  TROPONINI 0.06* <0.03   BNP: Invalid input(s): POCBNP CBG: No results for input(s): GLUCAP in the last 168  hours.    Disposition and Follow-up: Discharge Instructions    (HEART FAILURE PATIENTS) Call MD:  Anytime you have any of the following symptoms: 1) 3 pound weight gain in 24 hours or 5 pounds in 1 week 2) shortness of breath, with or without a dry hacking cough 3) swelling in the hands, feet or stomach 4) if you have to sleep on extra pillows at night in order to breathe.   Complete by:  As directed    Diet - low sodium heart healthy   Complete by:  As directed    Increase activity slowly   Complete by:  As directed        DISPOSITION: Home   DISCHARGE FOLLOW-UP Follow-up Information    Wayne Olp, MD. Schedule an appointment as soon as possible for a visit in 2 week(s).   Specialty:  Family Medicine Contact information: [place] [address] [phone]        Jettie Booze, MD. Schedule an appointment as soon as possible for a visit in 2 week(s).   Specialties:  Cardiology, Radiology, Interventional  Cardiology Contact information: [address] [place] [address] [phone]            Time coordinating discharge:  62mns   Signed:   REstill CottaM.D. [facility] 12/07/2017, 10:37 AM Pager: [phone]

## 2017-12-08 ENCOUNTER — Telehealth: Payer: Self-pay | Admitting: Family Medicine

## 2017-12-08 ENCOUNTER — Inpatient Hospital Stay (HOSPITAL_COMMUNITY): Payer: Medicare Other

## 2017-12-08 ENCOUNTER — Encounter (HOSPITAL_BASED_OUTPATIENT_CLINIC_OR_DEPARTMENT_OTHER): Payer: Medicare Other

## 2017-12-08 ENCOUNTER — Telehealth: Payer: Self-pay | Admitting: *Deleted

## 2017-12-08 ENCOUNTER — Encounter (HOSPITAL_COMMUNITY): Payer: Self-pay | Admitting: *Deleted

## 2017-12-08 LAB — BASIC METABOLIC PANEL
ANION GAP: 5 (ref 5–15)
BUN: 22 mg/dL — ABNORMAL HIGH (ref 6–20)
CALCIUM: 8.7 mg/dL — AB (ref 8.9–10.3)
CO2: 29 mmol/L (ref 22–32)
Chloride: 102 mmol/L (ref 101–111)
Creatinine, Ser: 1.25 mg/dL — ABNORMAL HIGH (ref 0.61–1.24)
GFR, EST NON AFRICAN AMERICAN: 55 mL/min — AB (ref >60)
Glucose, Bld: 102 mg/dL — ABNORMAL HIGH (ref 65–99)
Potassium: 4.7 mmol/L (ref 3.5–5.1)
SODIUM: 136 mmol/L (ref 135–145)

## 2017-12-08 MED ORDER — IOHEXOL 300 MG/ML  SOLN
100.0000 mL | Freq: Once | INTRAMUSCULAR | Status: AC | PRN
  Administered 2017-12-08: 75 mL via INTRAVENOUS

## 2017-12-08 NOTE — Telephone Encounter (Signed)
Per chart review: Admit date: 12/03/2017 Discharge date: 12/07/2017  Primary Care Physician:  Marin Olp, MD   Recommendations for Outpatient Follow-up:  1. Follow up with PCP in 1-2 weeks  Home Health: None  Equipment/Devices: None  Discharge Condition: stable  CODE STATUS: FULL Diet recommendation: Heart healthy diet  _____________________________________________________________________________ Per telephone call: Transition Care Management Follow-up Telephone Call   Date discharged?   How have you been since you were released from the hospital? "good"   Do you understand why you were in the hospital? yes   Do you understand the discharge instructions? yes   Where were you discharged to? Home   Items Reviewed:  Medications reviewed: yes. Patient just got home from hospital. Has not picked up new prescriptions. We reviewed medications changes per AVS. He will want to talk to Dr Yong Channel about this at his visit.   Allergies reviewed: yes  Dietary changes reviewed: yes  Referrals reviewed: yes   Functional Questionnaire:   Activities of Daily Living (ADLs):   He states they are independent in the following: ambulation, bathing and hygiene, feeding, continence, grooming, toileting and dressing States they require assistance with the following: none   Any transportation issues/concerns?: no   Any patient concerns? no   Confirmed importance and date/time of follow-up visits scheduled yes Provider Appointment booked with Dr Yong Channel 12/16/17 3:45.   Confirmed with patient if condition begins to worsen call PCP or go to the ER.  Patient was given the office number and encouraged to call back with question or concerns.  : yes

## 2017-12-08 NOTE — Progress Notes (Signed)
CXR did not show cause of right neck base/supraclavicular mass. Ultrasound negative for nodes or hematoma. Plan for soft tissue CT today - suspect could be herniated fat if new. Ok for d/c from a cardiac standpoint. Office will reach out for follow-up with Dr. Irish Lack or APP sooner than his scheduled appt in September.  Pixie Casino, MD, [facility] [place] Director of the [facility]  Card[facility] Diplomate of the American Board of Clinical Lipidology Attending Cardiologist  Direct Dial: [phone]  Fax: [phone]  Website:  [email or web address]

## 2017-12-08 NOTE — Telephone Encounter (Signed)
Patient scheduled.

## 2017-12-08 NOTE — Telephone Encounter (Signed)
See note

## 2017-12-08 NOTE — Telephone Encounter (Signed)
Copied from [place] [id number]. Topic: Quick Communication - See Telephone Encounter >> Dec 08, 2017  3:01 PM Cleaster Corin, [place] wrote: CRM for notification. See Telephone encounter for: 12/08/17.  Pt. Is needing to schedule a hospital follow up with Dr. Yong Channel being discharged from hospital today 12/08/17. (was told that he needs to be seen this week)

## 2017-12-08 NOTE — Progress Notes (Signed)
Pt seen, no significant changes from DC summary 6/11, his discharge was delayed due to perception of increased R neck swelling, on CT this is consistent with subcutaneous fat. No further workup needed, please see DC summary per Dr.Rai 6/11  Wayne Polite, MD

## 2017-12-09 ENCOUNTER — Other Ambulatory Visit: Payer: Self-pay

## 2017-12-09 ENCOUNTER — Other Ambulatory Visit: Payer: Self-pay | Admitting: *Deleted

## 2017-12-09 DIAGNOSIS — I5033 Acute on chronic diastolic (congestive) heart failure: Secondary | ICD-10-CM

## 2017-12-09 NOTE — Telephone Encounter (Signed)
Noted thanks °

## 2017-12-09 NOTE — Telephone Encounter (Signed)
noted 

## 2017-12-09 NOTE — Consult Note (Signed)
[facility] Care Management follow up.  Chart reviewed. Noted patient discharged on 12/08/17. Will make referral to [facility] for follow up.  Marthenia Rolling, MSN-Ed, RN,BSN [facility] Liaison [phone]

## 2017-12-09 NOTE — Patient Outreach (Signed)
[facility]) Care Management  12/09/2017  Wayne Booth 1941-02-11 [id number]  Referral received per hospital liaison on 12/09/17.  Subjective: "I am doing better I am doing a whole lot"  Assessment: 77 year old with recent admission 6/7-6/12 for atrial fibrillation/rapid ventricular response/ acute on chronic heart failure. History of prostate adenocarcinoma, renal cell carcinoma, morbid obesity, HTn, osteoarthritis, generalized anxiety disorder, hypothyroidism, gout.  Client reports he has his follow up appointments scheduled. He reports he has his all medications and denies any issues or questions regarding medication.  RNCM discussed weights. Client states he has not been weighing, but states he will start.  Client denies any questions or concerns at this time.  RNCM attempted to schedule home visit. However, client declines home visit at this time stating he was going to be busy next week. Client request RNCM call him the week after next.  Plan: telephonic follow up in 2 weeks.  Thea Silversmith, RN, MSN, [facility] Coordinator Cell: [phone]

## 2017-12-16 ENCOUNTER — Encounter: Payer: Self-pay | Admitting: Family Medicine

## 2017-12-16 ENCOUNTER — Ambulatory Visit (INDEPENDENT_AMBULATORY_CARE_PROVIDER_SITE_OTHER): Payer: Medicare Other | Admitting: Family Medicine

## 2017-12-16 VITALS — BP 126/82 | HR 80 | Temp 98.3°F | Ht 71.0 in | Wt 297.0 lb

## 2017-12-16 DIAGNOSIS — M10079 Idiopathic gout, unspecified ankle and foot: Secondary | ICD-10-CM | POA: Diagnosis not present

## 2017-12-16 DIAGNOSIS — G4733 Obstructive sleep apnea (adult) (pediatric): Secondary | ICD-10-CM | POA: Diagnosis not present

## 2017-12-16 DIAGNOSIS — I4892 Unspecified atrial flutter: Secondary | ICD-10-CM

## 2017-12-16 DIAGNOSIS — I10 Essential (primary) hypertension: Secondary | ICD-10-CM

## 2017-12-16 DIAGNOSIS — R079 Chest pain, unspecified: Secondary | ICD-10-CM | POA: Diagnosis not present

## 2017-12-16 DIAGNOSIS — R739 Hyperglycemia, unspecified: Secondary | ICD-10-CM | POA: Diagnosis not present

## 2017-12-16 DIAGNOSIS — I5032 Chronic diastolic (congestive) heart failure: Secondary | ICD-10-CM | POA: Diagnosis not present

## 2017-12-16 DIAGNOSIS — Z86711 Personal history of pulmonary embolism: Secondary | ICD-10-CM

## 2017-12-16 DIAGNOSIS — I5030 Unspecified diastolic (congestive) heart failure: Secondary | ICD-10-CM | POA: Insufficient documentation

## 2017-12-16 NOTE — Assessment & Plan Note (Signed)
Presented to the hospital with atrial fibrillation with RVR.  Rate was controlled initially with Cardizem drip after bolus.  Patient's diltiazem was increased to 240 mg extended release daily.  He was also placed on metoprolol 12.5 mg twice daily.  He was continued on Eliquis 5 mg twice a day.  Patient's baseline congestive heart failure was likely exacerbated by RVR.

## 2017-12-16 NOTE — Progress Notes (Signed)
Subjective:  ARMANDO BUKHARI is a 77 y.o. year old very pleasant male patient who presents for transitional care management and hospital follow up for atrial fibrillation with RVR and heart failure with preserved ejection fraction. Patient was hospitalized from 12/03/2017 to 12/07/2017. A TCM phone call was completed on 12/08/2017. Medical complexity moderate  Patient presented to the hospital after feeling dizzy, unsteady, short of breath, and with sharp left-sided substernal chest pain-in the emergency room he was found to be in atrial fibrillation with rapid ventricular response.  His heart rate was in the 150s- he was given IV Cardizem bolus 20 mg and placed on Cardizem drip.  He was also noted to be fluid overloaded and given 20 mg Lasix IV.  Prior to this he had recently been treated for gout versus cellulitis with prednisone taper as well as doxycycline on outpatient basis-these were improving by time of admission.  Patient had a nuclear medicine stress test which showed a remote infarct along the proximal septum, small area of reversible ischemia at the apex, mild hypokinesia along the septum, intermediate risk stress test.  There were no acute plans to proceed with cardiac catheterization.  Outpatient cardiology follow-up was planned.  He sees them next week.  We will await their recommendations.  Minimal elevation on 1 of 3 troponins which resolved on next check.  It was thought that patient's rapid ventricular response contributed to an acute on chronic heart failure exacerbation.  Patient required 20 mg IV Lasix in the emergency room and later another 60 mg IV dose.  He was able to be transitioned to oral Lasix and discharged on 20 mg daily.  His creatinine was stable with GFR in the 50s-very reasonable considering he only has one kidney.  He was diuresed 6 L while hospitalized.  Patient's gout flare resolved on prednisone.  He was placed on allopurinol 150mg  after uric acid was noted to be 8.3.  He  was started on colchicine while uric acid was lower.  HTN controlled while hospitalized- meds changes as per HTN section. Patient counseled on weight loss importance in hopital for morbid obesity. Hypothyroidism was continued on Synthroid.    See problem oriented charting as well ROS- reports fatigue, gets winded with activity but not worsening, still with edema but not worsening. No fever or chills.    Past Medical History-  Patient Active Problem List   Diagnosis Date Noted  . (HFpEF) heart failure with preserved ejection fraction ([place]) 12/16/2017    Priority: High  . Chest pain     Priority: High  . Atrial flutter ([place]) 11/01/2017    Priority: High  . Shortness of breath 06/14/2017    Priority: High  . History of pulmonary embolism 10/23/2016    Priority: High  . 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 History of renal cell carcinoma 12/26/2007    Priority: High  . History of adenomatous polyp of colon 04/12/2017    Priority: Medium  . Cervical disc disease 11/13/2015    Priority: Medium  . Essential tremor 08/14/2015    Priority: Medium  . Gout 08/14/2015    Priority: Medium  . Hypothyroidism 07/12/2014    Priority: Medium  . Hyperglycemia 07/12/2014    Priority: Medium  . GAD (generalized anxiety disorder) 11/07/2013    Priority: Medium  . OSA (obstructive sleep apnea) 07/07/2011    Priority: Medium  . Morbid obesity ([place]) 10/02/2008    Priority: Medium  . Essential hypertension 12/28/2006  Priority: Medium  . Anticoagulated     Priority: Low  . Community acquired pneumonia 06/18/2017    Priority: Low  . Asthmatic bronchitis 06/09/2011    Priority: Low  . Actinic keratosis 11/27/2009    Priority: Low  . Osteoarthritis 11/13/2008    Priority: Low  . TESTOSTERONE DEFICIENCY 12/28/2006    Priority: Low  . Bilateral foot pain 07/13/2017    Medications- reviewed and updated  A medical reconciliation was performed comparing current  medicines to hospital discharge medications. Current Outpatient Medications  Medication Sig Dispense Refill  . albuterol (PROVENTIL HFA;VENTOLIN HFA) 108 (90 Base) MCG/ACT inhaler Inhale 2 puffs into the lungs every 6 (six) hours as needed for wheezing or shortness of breath. 1 Inhaler 5  . allopurinol (ZYLOPRIM) 300 MG tablet Take 0.5 tablets (150 mg total) by mouth daily. 30 tablet 3  . apixaban (ELIQUIS) 5 MG TABS tablet Take 1 tablet (5 mg total) by mouth 2 (two) times daily. 60 tablet 1  . budesonide-formoterol (SYMBICORT) 160-4.5 MCG/ACT inhaler Inhale 2 puffs into the lungs 2 (two) times daily as needed (for respiratory flares).    . clotrimazole-betamethasone (LOTRISONE) cream Apply 1 application topically 2 (two) times daily. Use for 10 days maximum. (Patient taking differently: Apply 1 application topically 2 (two) times daily as needed (for rashes). ) 45 g 1  . colchicine 0.6 MG tablet Take 1 tablet (0.6 mg total) by mouth daily. 30 tablet 3  . cyclobenzaprine (FLEXERIL) 10 MG tablet Take 10 mg by mouth daily as needed for muscle spasms.     Marland Kitchen diltiazem (CARDIZEM CD) 240 MG 24 hr capsule Take 1 capsule (240 mg total) by mouth daily. 30 capsule 3  . escitalopram (LEXAPRO) 20 MG tablet Take 1 tablet (20 mg total) by mouth daily. TAKE 20 mg every day (Patient taking differently: Take 20 mg by mouth daily. ) 90 tablet 3  . furosemide (LASIX) 20 MG tablet Take 1 tablet (20 mg total) by mouth daily. 30 tablet 3  . levothyroxine (SYNTHROID, LEVOTHROID) 150 MCG tablet TAKE ONE TABLET BY MOUTH DAILY 90 tablet 2  . metoprolol tartrate (LOPRESSOR) 25 MG tablet Take 0.5 tablets (12.5 mg total) by mouth 2 (two) times daily. 60 tablet 3  . Multiple Vitamins-Minerals (MULTIVITAMINS THER. W/MINERALS) TABS Take 1 tablet by mouth daily.    . nitroGLYCERIN (NITROSTAT) 0.4 MG SL tablet Place 1 tablet (0.4 mg total) under the tongue every 5 (five) minutes as needed for chest pain. 30 tablet 12  .  nortriptyline (PAMELOR) 75 MG capsule TAKE ONE CAPSULE BY MOUTH AT BEDTIME 90 capsule 1  . traZODone (DESYREL) 50 MG tablet TAKE ONE HALF TO ONE TABLET BY MOUTH EVERY NIGHT AT BEDTIME AS NEEDED FOR SLEEP (Patient taking differently: Take 50 mg by mouth at bedtime. ) 90 tablet 2   No current facility-administered medications for this visit.     Objective: BP 126/82 (BP Location: Left Arm, Patient Position: Sitting, Cuff Size: Normal)   Pulse 80   Temp 98.3 F (36.8 C) (Oral)   Ht 5\' 11"  (1.803 m)   Wt 297 lb (134.7 kg)   SpO2 93%   BMI 41.42 kg/m  Gen: NAD, resting comfortably CV: RRR no murmurs rubs or gallops Lungs: CTAB no crackles, wheeze, rhonchi Abdomen: soft/nontender/nondistended/normal bowel sounds. No rebound or guarding.  Ext: 1+ edema Skin: warm, dry  Assessment/Plan:  Atrial flutter (HCC) Presented to the hospital with atrial fibrillation with RVR.  Rate was controlled initially with  Cardizem drip after bolus.  Patient's diltiazem was increased to 240 mg extended release daily.  He was also placed on metoprolol 12.5 mg twice daily.  He was continued on Eliquis 5 mg twice a day.  Patient's baseline congestive heart failure was likely exacerbated by RVR.  Chest pain Patient had a nuclear medicine stress test which showed a remote infarct along the proximal septum, small area of reversible ischemia at the apex, mild hypokinesia along the septum, intermediate risk stress test.  There were no acute plans to proceed with cardiac catheterization.  Outpatient cardiology follow-up was planned.  He sees them next week.  We will await their recommendations.  With new findings on stress test- we are hopeful patient will be considered for cardiac rehab- he is going to talk with cardiology next week- he was previously denied as no MI/clear CAD history  Acute gouty arthritis Continue allopurinol 300 continue colchicine until next visit-Hop  We will update uric acid level at next  visit.efully we can stop at that point.   (HFpEF) heart failure with preserved ejection fraction ([place]) Updated echocardiogram in the hospital showed ejection fraction of 50 to 55% with grade 2 diastolic dysfunction.  Suspect heart failure with preserved ejection fraction.  Required IV Lasix for diuresis-now on oral Lasix.  We will continue oral Lasix 20 mg and update bmet today.  Suspect patient will need to be on low-dose Lasix long-term.  He was diuresed 6 L in the hospital.  Essential hypertension Losartan was stopped in the hospital.  Patient needed Lasix for heart failure and metoprolol as well as diltiazem for atrial fibrillation/flutter- continue current medications.  OSA (obstructive sleep apnea) Scheduled within a few weeks for updated sleep study.  Gout Continue allopurinol 150mg  continue colchicine until next visit-Hop  We will update uric acid level at next visit.efully we can stop at that point.   Hyperglycemia Will update a1c- encouraged weight loss Lab Results  Component Value Date   HGBA1C 6.1 04/12/2017    Future Appointments  Date Time Provider [place]  12/20/2017 11:00 AM Jettie Booze, MD [facility]  12/20/2017  3:30 PM Luretha Rued, RN [facility] None  01/02/2018  8:00 PM [facility] 3 [facility]  03/01/2018  8:45 AM Marin Olp, MD [facility]  03/17/2018 10:40 AM Jettie Booze, MD [facility]   Lab/Order associations: Essential hypertension - Plan: Basic metabolic panel, CBC with Differential/Platelet  Hyperglycemia - Plan: Hemoglobin A1c  Return precautions advised.  [name], MD

## 2017-12-16 NOTE — Assessment & Plan Note (Signed)
Losartan was stopped in the hospital.  Patient needed Lasix for heart failure and metoprolol as well as diltiazem for atrial fibrillation/flutter- continue current medications.

## 2017-12-16 NOTE — Assessment & Plan Note (Signed)
Continue allopurinol 150mg  continue colchicine until next visit-Hop  We will update uric acid level at next visit.efully we can stop at that point.

## 2017-12-16 NOTE — Patient Instructions (Addendum)
Please stop by lab before you go  No changes to medicine today  See me back in 2-3 months, sooner if you need [place]. We may be able to stop colchicine at that time  Serotonin Syndrome- slight risk with lexapro and nortriptyline Serotonin is a brain chemical that regulates the nervous system, which includes the brain, spinal cord, and nerves. Serotonin appears to play a role in all types of behavior, including appetite, emotions, movement, thinking, and response to stress. Excessively high levels of serotonin in the body can cause serotonin syndrome, which is a very dangerous condition. What are the causes? This condition can be caused by taking medicines or drugs that increase the level of serotonin in your body. These include:  Antidepressant medicines.  Migraine medicines.  Certain pain medicines.  Certain recreational drugs, including ecstasy, LSD, cocaine, and amphetamines.  Over-the-counter cough or cold medicines that contain dextromethorphan.  Certain herbal supplements, including St. John's wort, ginseng, and nutmeg.  This condition usually occurs when you take these medicines or drugs in combination, but it can also happen with a high dose of a single medicine or drug. What increases the risk? This condition is more likely to develop in:  People who have recently increased the dosage of medicine that increases the serotonin level.  People who just started taking medicine that increases the serotonin level.  What are the signs or symptoms? Symptoms of this condition usually happens within several hours of a medicine change. Symptoms include:  Headache.  Muscle twitching or stiffness.  Diarrhea.  Confusion.  Restlessness or agitation.  Shivering or goose bumps.  Loss of muscle coordination.  Rapid heart rate.  Sweating.  Severe cases of serotonin syndromecan cause:  Irregular heartbeat.  Seizures.  Loss of consciousness.  High fever.  How is this  diagnosed? This condition is diagnosed with a medical history and physical exam. You will be asked aboutyour symptoms and your use of medicines and recreational drugs. Your health care provider may also order lab work or additional tests to rule out other causes of your symptoms. How is this treated? The treatment for this condition depends on the severity of your symptoms. For mild cases, stopping the medicine that caused your condition is usually all that is needed. For moderate to severe cases, hospitalization is required to monitor you and to prevent further muscle damage. Follow these instructions at home:  Take over-the-counter and prescription medicines only as told by your health care provider. This is important.  Check with your health care provider before you start taking any new prescriptions, over-the-counter medicines, herbs, or supplements.  Avoid combining any medicines that can cause this condition to occur.  Keep all follow-up visits as told by your health care provider.This is important.  Maintain a healthy lifestyle. ? Eat healthy foods. ? Get plenty of sleep. ? Exercise regularly. ? Do not drink alcohol. ? Do not use recreational drugs. Contact a health care provider if:  Medicines do not seem to be helping.  Your symptoms do not improve or they get worse.  You have trouble taking care of yourself. Get help right away if:  You have worsening confusion, severe headache, chest pain, high fever, seizures, or loss of consciousness.  You have serious thoughts about hurting yourself or others.  You experience serious side effects of medicine, such as swelling of your face, lips, tongue, or throat. This information is not intended to replace advice given to you by your health care provider. Make sure you  discuss any questions you have with your health care provider. Document Released: 07/23/2004 Document Revised: 02/08/2016 Document Reviewed: 06/28/2014 Elsevier  Interactive Patient Education  [employer].

## 2017-12-16 NOTE — Assessment & Plan Note (Signed)
Will update a1c- encouraged weight loss Lab Results  Component Value Date   HGBA1C 6.1 04/12/2017

## 2017-12-16 NOTE — Assessment & Plan Note (Signed)
Scheduled within a few weeks for updated sleep study.

## 2017-12-16 NOTE — Assessment & Plan Note (Signed)
Continue allopurinol 300 continue colchicine until next visit-Hop  We will update uric acid level at next visit.efully we can stop at that point.

## 2017-12-16 NOTE — Assessment & Plan Note (Signed)
Updated echocardiogram in the hospital showed ejection fraction of 50 to 55% with grade 2 diastolic dysfunction.  Suspect heart failure with preserved ejection fraction.  Required IV Lasix for diuresis-now on oral Lasix.  We will continue oral Lasix 20 mg and update bmet today.  Suspect patient will need to be on low-dose Lasix long-term.  He was diuresed 6 L in the hospital.

## 2017-12-16 NOTE — Assessment & Plan Note (Addendum)
Patient had a nuclear medicine stress test which showed a remote infarct along the proximal septum, small area of reversible ischemia at the apex, mild hypokinesia along the septum, intermediate risk stress test.  There were no acute plans to proceed with cardiac catheterization.  Outpatient cardiology follow-up was planned.  He sees them next week.  We will await their recommendations.  With new findings on stress test- we are hopeful patient will be considered for cardiac rehab- he is going to talk with cardiology next week- he was previously denied as no MI/clear CAD history

## 2017-12-17 LAB — BASIC METABOLIC PANEL
BUN: 15 mg/dL (ref 6–23)
CALCIUM: 9.2 mg/dL (ref 8.4–10.5)
CO2: 28 mEq/L (ref 19–32)
Chloride: 99 mEq/L (ref 96–112)
Creatinine, Ser: 1.16 mg/dL (ref 0.40–1.50)
GFR: 65.15 mL/min (ref >60.00)
GLUCOSE: 84 mg/dL (ref 70–99)
POTASSIUM: 4.5 meq/L (ref 3.5–5.1)
Sodium: 134 mEq/L — ABNORMAL LOW (ref 135–145)

## 2017-12-17 LAB — CBC WITH DIFFERENTIAL/PLATELET
BASOS ABS: 0 10*3/uL (ref 0.0–0.1)
Basophils Relative: 0.2 % (ref 0.0–3.0)
EOS PCT: 7 % — AB (ref 0.0–5.0)
Eosinophils Absolute: 0.7 10*3/uL (ref 0.0–0.7)
HCT: 46.6 % (ref 39.0–52.0)
HEMOGLOBIN: 15.9 g/dL (ref 13.0–17.0)
LYMPHS ABS: 2.4 10*3/uL (ref 0.7–4.0)
Lymphocytes Relative: 22.5 % (ref 12.0–46.0)
MCHC: 34.1 g/dL (ref 30.0–36.0)
MCV: 90.5 fl (ref 78.0–100.0)
MONO ABS: 1.3 10*3/uL — AB (ref 0.1–1.0)
Monocytes Relative: 12.3 % — ABNORMAL HIGH (ref 3.0–12.0)
NEUTROS PCT: 58 % (ref 43.0–77.0)
Neutro Abs: 6.2 10*3/uL (ref 1.4–7.7)
Platelets: 307 10*3/uL (ref 150.0–400.0)
RBC: 5.15 Mil/uL (ref 4.22–5.81)
RDW: 15.2 % (ref 11.5–15.5)
WBC: 10.7 10*3/uL — AB (ref 4.0–10.5)

## 2017-12-17 LAB — HEMOGLOBIN A1C: Hgb A1c MFr Bld: 6.3 % (ref 4.6–6.5)

## 2017-12-17 NOTE — Progress Notes (Signed)
Your CBC was largely normal (blood counts, infection fighting cells, platelets). You did have a slight elevation in your white blood cells but it was very minimal- we can continue to watch this with future labs Your CMET was largely normal (kidney, liver, and electrolytes, blood sugar).  Your a1c was very high risk for diabetes. Hopefully with hospitalizations behind you - you can get more active again and improve these #s. You are getting very close to diabetes

## 2017-12-19 NOTE — Progress Notes (Signed)
Cardiology Office Note   Date:  12/20/2017   ID:  Gaynor, Ferreras 06-06-1941, MRN [id number]  PCP:  Marin Olp, MD    No chief complaint on file.  Atrial flutter  Wt Readings from Last 3 Encounters:  12/20/17 297 lb 3.2 oz (134.8 kg)  12/16/17 297 lb (134.7 kg)  12/08/17 291 lb 9.6 oz (132.3 kg)       History of Present Illness: RITTER HELSLEY is a 77 y.o. male   With obesity and OSA. He has had HTN as well. He has had dizziness intermittently for years, but it got worse in early Dec 2017. His BP meds were reduced and he has had no further episodes. Dizziness would occur would when going from sitting to standing. No syncope.   He had a cath in 4/08 that was normal.   He had hand surgery in the past and then a few weeks later, was diagnosed with PE. He was started on Xarelto.  Diagnosed with atrial flutter in 5/19.  Hospitalized and converted to NSR.  He was discharged on Eliquis.   Admitted in June with AFib with RVR.  His watch was alerting him of a fast heart rate.  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 -Had intermittent episode of atrial flutter on 6/10, metoprolol was added to the regimen, remains in normal sinus rhythm -Cardiology recommended continue diltiazem increased to 240mg  XL daily, metoprolol 12.5 mg twice a day, Eliquis 5 mg twice a day  Chest pain was thought to be due to AFib.  He has had a brief ches tpain at home, but it lasted too short a time to take a NTG.  One episode of HR 144 but this resolved spontaneously.    He had an episode of HR in the 40's per his watch.     Past Medical History:  Diagnosis Date  . Allergy    States his nose runs when he is around grease.   Marland Kitchen Anxiety   . Arthritis   . Atrial fibrillation ([place])   . Cancer ([place])   . Coronary  artery disease   . History of total knee replacement   . Hypertension   . Hypothyroidism   . Kidney cancer, primary, with metastasis from kidney to other site [facility])   . Kidney stone   . OSA (obstructive sleep apnea)    pt does not wear cpap at night  . Pituitary cyst ([place])   . Pneumonia    hx of  . Prostate cancer [facility])     Past Surgical History:  Procedure Laterality Date  . ANTERIOR CERVICAL DECOMP/DISCECTOMY FUSION N/A 08/29/2012   Procedure: ANTERIOR CERVICAL DECOMPRESSION/DISCECTOMY FUSION 1 LEVEL;  Surgeon: Eustace Moore, MD;  Location: [place] NEURO ORS;  Service: Neurosurgery;  Laterality: N/A;  . APPENDECTOMY    . CARDIAC CATHETERIZATION  2008   clean  . CHOLECYSTECTOMY    . COLONOSCOPY W/ POLYPECTOMY    . CRANIOTOMY N/A 11/08/2012   Procedure: CRANIOTOMY HYPOPHYSECTOMY TRANSNASAL APPROACH;  Surgeon: Faythe Ghee, MD;  Location: [place] NEURO ORS;  Service: Neurosurgery;  Laterality: N/A;  Transphenoidal Resection of Pituitary Tumor  . FINGER SURGERY Left 2017   Dr Amedeo Plenty  . INSERTION PROSTATE RADIATION SEED    . KIDNEY STONE SURGERY    . KNEE ARTHROSCOPY  2007  left  . NEPHRECTOMY Left   . POSTERIOR CERVICAL LAMINECTOMY Left 04/24/2015   Procedure: Foraminotomy cervical five - cervical six cervical six - cervical seven left;  Surgeon: Eustace Moore, MD;  Location: [facility]UR[facility]S;  Service: Neurosurgery;  Laterality: Left;  . REPLACEMENT TOTAL KNEE BILATERAL       Current Outpatient Medications  Medication Sig Dispense Refill  . albuterol (PROVENTIL HFA;VENTOLIN HFA) 108 (90 Base) MCG/ACT inhaler Inhale 2 puffs into the lungs every 6 (six) hours as needed for wheezing or shortness of breath. 1 Inhaler 5  . allopurinol (ZYLOPRIM) 300 MG tablet Take 0.5 tablets (150 mg total) by mouth daily. 30 tablet 3  . apixaban (ELIQUIS) 5 MG TABS tablet Take 1 tablet (5 mg total) by mouth 2 (two) times daily. 60 tablet 1  . budesonide-formoterol (SYMBICORT) 160-4.5 MCG/ACT inhaler Inhale 2  puffs into the lungs 2 (two) times daily as needed (for respiratory flares).    . clotrimazole-betamethasone (LOTRISONE) cream Apply 1 application topically 2 (two) times daily. Use for 10 days maximum. (Patient taking differently: Apply 1 application topically 2 (two) times daily as needed (for rashes). ) 45 g 1  . colchicine 0.6 MG tablet Take 1 tablet (0.6 mg total) by mouth daily. 30 tablet 3  . cyclobenzaprine (FLEXERIL) 10 MG tablet Take 10 mg by mouth daily as needed for muscle spasms.     Marland Kitchen diltiazem (CARDIZEM CD) 240 MG 24 hr capsule Take 1 capsule (240 mg total) by mouth daily. 30 capsule 3  . escitalopram (LEXAPRO) 20 MG tablet Take 1 tablet (20 mg total) by mouth daily. TAKE 20 mg every day (Patient taking differently: Take 20 mg by mouth daily. ) 90 tablet 3  . furosemide (LASIX) 20 MG tablet Take 1 tablet (20 mg total) by mouth daily. 30 tablet 3  . levothyroxine (SYNTHROID, LEVOTHROID) 150 MCG tablet TAKE ONE TABLET BY MOUTH DAILY 90 tablet 2  . metoprolol tartrate (LOPRESSOR) 25 MG tablet Take 0.5 tablets (12.5 mg total) by mouth 2 (two) times daily. 60 tablet 3  . Multiple Vitamins-Minerals (MULTIVITAMINS THER. W/MINERALS) TABS Take 1 tablet by mouth daily.    . nitroGLYCERIN (NITROSTAT) 0.4 MG SL tablet Place 1 tablet (0.4 mg total) under the tongue every 5 (five) minutes as needed for chest pain. 30 tablet 12  . nortriptyline (PAMELOR) 75 MG capsule TAKE ONE CAPSULE BY MOUTH AT BEDTIME 90 capsule 1  . traZODone (DESYREL) 50 MG tablet TAKE ONE HALF TO ONE TABLET BY MOUTH EVERY NIGHT AT BEDTIME AS NEEDED FOR SLEEP (Patient taking differently: Take 50 mg by mouth at bedtime. ) 90 tablet 2   No current facility-administered medications for this visit.     Allergies:   Patient has no known allergies.    Social History:  The patient  reports that he has quit smoking. His smoking use included cigars. He quit after 4.00 years of use. He has never used smokeless tobacco. He reports that  he drinks about 1.2 oz of alcohol per week. He reports that he does not use drugs.   Family History:  The patient's family history includes Cancer in his father and mother; Colon cancer (age of onset: 69) in his brother; Kidney cancer in his sister.    ROS:  Please see the history of present illness.   Otherwise, review of systems are positive for fatigue, SHOB.   All other systems are reviewed and negative.    PHYSICAL EXAM: VS:  BP 100/68  Pulse 79   Ht 5\' 11"  (1.803 m)   Wt 297 lb 3.2 oz (134.8 kg)   SpO2 91%   BMI 41.45 kg/m  , BMI Body mass index is 41.45 kg/m. GEN: Well nourished, well developed, in no acute distress  HEENT: normal  Neck: no JVD, carotid bruits, or masses Cardiac: RRR; no murmurs, rubs, or gallops,no edema  Respiratory:  clear to auscultation bilaterally, normal work of breathing GI: soft, nontender, nondistended, + BS MS: no deformity or atrophy  Skin: warm and dry, no rash Neuro:  Strength and sensation are intact Psych: euthymic mood, full affect      Recent Labs: 11/01/2017: ALT 34; Magnesium 1.9 12/03/2017: B Natriuretic Peptide 271.9 12/06/2017: TSH 0.569 12/16/2017: BUN 15; Creatinine, Ser 1.16; Hemoglobin 15.9; Platelets 307.0; Potassium 4.5; Sodium 134   Lipid Panel    Component Value Date/Time   CHOL 141 04/12/2017 0833   TRIG 168.0 (H) 04/12/2017 0833   HDL 38.20 (L) 04/12/2017 0833   CHOLHDL 4 04/12/2017 0833   VLDL 33.6 04/12/2017 0833   LDLCALC 69 04/12/2017 0833   LDLDIRECT 62.0 09/07/2006 0920     Other studies Reviewed: Additional studies/ records that were reviewed today with results demonstrating: stress test result.   ASSESSMENT AND PLAN:  1. Atrial flutter: Refer to EP for consideration of ablation.  Had an episode of HR in the 40s per his watch.  Increasing meds will be difficult.  He has now had several episodes of atrial flutter.  Eliquis for stroke prevention.  2. Abnormal stress test: May need a cath. No angina at  this time, and test is not high risk abnormal.  CP was thought to be related to fast HR. Will let EP decide whether they would do ablation first, so as to avoid having to possibly do procedure on clopidogrel and Eliquis. 3. Morbid obesity: Fatigue and SHOB are likely related to BMI, at least in part.  Sleep study scheduled for July. 4. HTN: The current medical regimen is effective;  continue present plan and medications. 5. H/O PE: was treated with anticoagulation.    Current medicines are reviewed at length with the patient today.  The patient concerns regarding his medicines were addressed.  The following changes have been made:  No change  Labs/ tests ordered today include:  No orders of the defined types were placed in this encounter.   Recommend 150 minutes/week of aerobic exercise Low fat, low carb, high fiber diet recommended  Disposition:   FU after EP referral   Signed, Larae Grooms, MD  12/20/2017 11:30 AM    [address], [place], [place]  [address] Phone: [phone]; Fax: [phone]

## 2017-12-20 ENCOUNTER — Encounter: Payer: Self-pay | Admitting: Interventional Cardiology

## 2017-12-20 ENCOUNTER — Other Ambulatory Visit: Payer: Self-pay

## 2017-12-20 ENCOUNTER — Encounter (INDEPENDENT_AMBULATORY_CARE_PROVIDER_SITE_OTHER): Payer: Self-pay

## 2017-12-20 ENCOUNTER — Ambulatory Visit: Payer: Medicare Other | Admitting: Interventional Cardiology

## 2017-12-20 VITALS — BP 100/68 | HR 79 | Ht 71.0 in | Wt 297.2 lb

## 2017-12-20 DIAGNOSIS — R9439 Abnormal result of other cardiovascular function study: Secondary | ICD-10-CM

## 2017-12-20 DIAGNOSIS — Z7901 Long term (current) use of anticoagulants: Secondary | ICD-10-CM

## 2017-12-20 DIAGNOSIS — I4892 Unspecified atrial flutter: Secondary | ICD-10-CM | POA: Diagnosis not present

## 2017-12-20 NOTE — Patient Outreach (Signed)
[facility]) Care Management  12/20/2017  Wayne Booth 01/19/1941 [id number]   Assessment: 77 year old with recent admission 6/7-6/12 for atrial fibrillation/rapid ventricular response/ acute on chronic heart failure. History of prostate adenocarcinoma, renal cell carcinoma, morbid obesity, HTn, osteoarthritis, generalized anxiety disorder, hypothyroidism, gout.  RNCM called to follow up. Client request to taken off the list. He reports his is doing alright and has seen cardiologist. Client declines further participation in care management services.    RNCM reinforced if condition changes that he can call RNCM also reinforced the 24 hour nurse advice line is still available for him to call as needed.  Plan: close case.  Thea Silversmith, RN, MSN, [place] Coordinator Cell: [phone]

## 2017-12-20 NOTE — Patient Instructions (Addendum)
Medication Instructions:  Your physician recommends that you continue on your current medications as directed. Please refer to the Current Medication list given to you today.   Labwork: None ordered  Testing/Procedures: None ordered  Follow-Up: You have been referred to EP for Atrial Flutter to discuss possible ablation.   Keep your appointment with Dr. Irish Lack in September.   Any Other Special Instructions Will Be Listed Below (If Applicable).     If you need a refill on your cardiac medications before your next appointment, please call your pharmacy.

## 2017-12-21 ENCOUNTER — Encounter: Payer: Self-pay | Admitting: Cardiology

## 2017-12-21 ENCOUNTER — Ambulatory Visit: Payer: Medicare Other | Admitting: Cardiology

## 2017-12-21 VITALS — BP 116/70 | HR 66 | Ht 71.0 in | Wt 297.4 lb

## 2017-12-21 DIAGNOSIS — I483 Typical atrial flutter: Secondary | ICD-10-CM | POA: Diagnosis not present

## 2017-12-21 DIAGNOSIS — I10 Essential (primary) hypertension: Secondary | ICD-10-CM

## 2017-12-21 DIAGNOSIS — I480 Paroxysmal atrial fibrillation: Secondary | ICD-10-CM

## 2017-12-21 NOTE — Progress Notes (Signed)
Electrophysiology Office Note   Date:  12/21/2017   ID:  Wayne, Booth Feb 17, 1941, MRN [id number]  PCP:  Marin Olp, MD  Cardiologist:  Irish Lack Primary Electrophysiologist:  Constance Haw, MD    Chief Complaint  Patient presents with  . Advice Only    AFlutter     History of Present Illness: Wayne Booth is a 77 y.o. male who is being seen today for the evaluation of atrial flutter at the request of Casandra Doffing. Presenting today for electrophysiology evaluation.  He has a history of obesity, hypertension, and OSA.  He had a left heart catheterization 09/2016 which was normal.  He had hand surgery a few weeks later and was ultimately diagnosed with a PE and put on Xarelto.  He was diagnosed with atrial flutter 10/2017.  He was hospitalized and converted to sinus rhythm and was discharged on Eliquis.  He was readmitted June with atrial fibrillation and rapid rates.    Today, he denies symptoms of palpitations, chest pain, shortness of breath, orthopnea, PND, lower extremity edema, claudication, dizziness, presyncope, syncope, bleeding, or neurologic sequela. The patient is tolerating medications without difficulties.  Main symptoms today are of weakness and fatigue.  He is trying to increase his exercise level.  He does have a sleep study planned for 7/7.   Past Medical History:  Diagnosis Date  . Allergy    States his nose runs when he is around grease.   Marland Kitchen Anxiety   . Arthritis   . Atrial fibrillation ([place])   . Cancer ([place])   . Coronary artery disease   . History of total knee replacement   . Hypertension   . Hypothyroidism   . Kidney cancer, primary, with metastasis from kidney to other site [facility])   . Kidney stone   . OSA (obstructive sleep apnea)    pt does not wear cpap at night  . Pituitary cyst ([place])   . Pneumonia    hx of  . Prostate cancer [facility])    Past Surgical History:  Procedure Laterality Date  . ANTERIOR CERVICAL DECOMP/DISCECTOMY  FUSION N/A 08/29/2012   Procedure: ANTERIOR CERVICAL DECOMPRESSION/DISCECTOMY FUSION 1 LEVEL;  Surgeon: Eustace Moore, MD;  Location: [place] NEURO ORS;  Service: Neurosurgery;  Laterality: N/A;  . APPENDECTOMY    . CARDIAC CATHETERIZATION  2008   clean  . CHOLECYSTECTOMY    . COLONOSCOPY W/ POLYPECTOMY    . CRANIOTOMY N/A 11/08/2012   Procedure: CRANIOTOMY HYPOPHYSECTOMY TRANSNASAL APPROACH;  Surgeon: Faythe Ghee, MD;  Location: [place] NEURO ORS;  Service: Neurosurgery;  Laterality: N/A;  Transphenoidal Resection of Pituitary Tumor  . FINGER SURGERY Left 2017   Dr Amedeo Plenty  . INSERTION PROSTATE RADIATION SEED    . KIDNEY STONE SURGERY    . KNEE ARTHROSCOPY  2007   left  . NEPHRECTOMY Left   . POSTERIOR CERVICAL LAMINECTOMY Left 04/24/2015   Procedure: Foraminotomy cervical five - cervical six cervical six - cervical seven left;  Surgeon: Eustace Moore, MD;  Location: [facility] NEURO ORS;  Service: Neurosurgery;  Laterality: Left;  . REPLACEMENT TOTAL KNEE BILATERAL       Current Outpatient Medications  Medication Sig Dispense Refill  . albuterol (PROVENTIL HFA;VENTOLIN HFA) 108 (90 Base) MCG/ACT inhaler Inhale 2 puffs into the lungs every 6 (six) hours as needed for wheezing or shortness of breath. 1 Inhaler 5  . allopurinol (ZYLOPRIM) 300 MG tablet Take 0.5 tablets (150 mg total) by mouth daily.  30 tablet 3  . apixaban (ELIQUIS) 5 MG TABS tablet Take 1 tablet (5 mg total) by mouth 2 (two) times daily. 60 tablet 1  . budesonide-formoterol (SYMBICORT) 160-4.5 MCG/ACT inhaler Inhale 2 puffs into the lungs 2 (two) times daily as needed (for respiratory flares).    . clotrimazole-betamethasone (LOTRISONE) cream Apply 1 application topically 2 (two) times daily. Use for 10 days maximum. (Patient taking differently: Apply 1 application topically 2 (two) times daily as needed (for rashes). ) 45 g 1  . colchicine 0.6 MG tablet Take 1 tablet (0.6 mg total) by mouth daily. 30 tablet 3  . cyclobenzaprine  (FLEXERIL) 10 MG tablet Take 10 mg by mouth daily as needed for muscle spasms.     Marland Kitchen diltiazem (CARDIZEM CD) 240 MG 24 hr capsule Take 1 capsule (240 mg total) by mouth daily. 30 capsule 3  . escitalopram (LEXAPRO) 20 MG tablet Take 1 tablet (20 mg total) by mouth daily. TAKE 20 mg every day (Patient taking differently: Take 20 mg by mouth daily. ) 90 tablet 3  . furosemide (LASIX) 20 MG tablet Take 1 tablet (20 mg total) by mouth daily. 30 tablet 3  . levothyroxine (SYNTHROID, LEVOTHROID) 150 MCG tablet TAKE ONE TABLET BY MOUTH DAILY 90 tablet 2  . metoprolol tartrate (LOPRESSOR) 25 MG tablet Take 0.5 tablets (12.5 mg total) by mouth 2 (two) times daily. 60 tablet 3  . Multiple Vitamins-Minerals (MULTIVITAMINS THER. W/MINERALS) TABS Take 1 tablet by mouth daily.    . nitroGLYCERIN (NITROSTAT) 0.4 MG SL tablet Place 1 tablet (0.4 mg total) under the tongue every 5 (five) minutes as needed for chest pain. 30 tablet 12  . nortriptyline (PAMELOR) 75 MG capsule TAKE ONE CAPSULE BY MOUTH AT BEDTIME 90 capsule 1  . traZODone (DESYREL) 50 MG tablet TAKE ONE HALF TO ONE TABLET BY MOUTH EVERY NIGHT AT BEDTIME AS NEEDED FOR SLEEP (Patient taking differently: Take 50 mg by mouth at bedtime. ) 90 tablet 2   No current facility-administered medications for this visit.     Allergies:   Patient has no known allergies.   Social History:  The patient  reports that he has quit smoking. His smoking use included cigars. He quit after 4.00 years of use. He has never used smokeless tobacco. He reports that he drinks about 1.2 oz of alcohol per week. He reports that he does not use drugs.   Family History:  The patient's family history includes Cancer in his father and mother; Colon cancer (age of onset: 61) in his brother; Kidney cancer in his sister.    ROS:  Please see the history of present illness.   Otherwise, review of systems is positive for shortness of breath, snoring, dizziness.   All other systems are  reviewed and negative.    PHYSICAL EXAM: VS:  BP 116/70   Pulse 66   Ht 5\' 11"  (1.803 m)   Wt 297 lb 6.4 oz (134.9 kg)   SpO2 93%   BMI 41.48 kg/m  , BMI Body mass index is 41.48 kg/m. GEN: Well nourished, well developed, in no acute distress  HEENT: normal  Neck: no JVD, carotid bruits, or masses Cardiac: RRR; no murmurs, rubs, or gallops,no edema  Respiratory:  clear to auscultation bilaterally, normal work of breathing GI: soft, nontender, nondistended, + BS MS: no deformity or atrophy  Skin: warm and dry Neuro:  Strength and sensation are intact Psych: euthymic mood, full affect  EKG:  EKG is ordered  today. Personal review of the ekg ordered shows sinus arrhythmia, first-degree AV block, rate 66  Recent Labs: 11/01/2017: ALT 34; Magnesium 1.9 12/03/2017: B Natriuretic Peptide 271.9 12/06/2017: TSH 0.569 12/16/2017: BUN 15; Creatinine, Ser 1.16; Hemoglobin 15.9; Platelets 307.0; Potassium 4.5; Sodium 134    Lipid Panel     Component Value Date/Time   CHOL 141 04/12/2017 0833   TRIG 168.0 (H) 04/12/2017 0833   HDL 38.20 (L) 04/12/2017 0833   CHOLHDL 4 04/12/2017 0833   VLDL 33.6 04/12/2017 0833   LDLCALC 69 04/12/2017 0833   LDLDIRECT 62.0 09/07/2006 0920     Wt Readings from Last 3 Encounters:  12/21/17 297 lb 6.4 oz (134.9 kg)  12/20/17 297 lb 3.2 oz (134.8 kg)  12/16/17 297 lb (134.7 kg)      Other studies Reviewed: Additional studies/ records that were reviewed today include: TTE 11/02/17  Review of the above records today demonstrates:  - Left ventricle: The cavity size was normal. Wall thickness was   increased in a pattern of mild LVH. Systolic function was normal.   The estimated ejection fraction was in the range of 50% to 55%.   Wall motion was normal; there were no regional wall motion   abnormalities. Features are consistent with a pseudonormal left   ventricular filling pattern, with concomitant abnormal relaxation   and increased filling pressure  (grade 2 diastolic dysfunction). - Aortic valve: Noncoronary cusp mobility was moderately   restricted. - Mitral valve: Calcified annulus.  Myoview 12/04/17 1. Decreased uptake along the proximal septum during both rest and stress images suggesting remote infarct. 2. Small area of reversible ischemia in the apex. 3. Mild hypokinesia along the septum. Wall motion and thickening is otherwise normal. 4. Left ventricular ejection fraction is 51%. 5. Noninvasive risk stratification*: Intermediate  ASSESSMENT AND PLAN:  1.  Typical atrial flutter/atrial fibrillation: Rates have been difficult to control due to resting bradycardia.  He has had both atrial fibrillation and atrial flutter while in the hospital.  Due to his significant symptoms, we Merick Kelleher plan for ablation.  Risks and benefits include bleeding, tamponade, heart block, stroke, damage to surrounding organs.  He understands these risks and is agreed to the procedure.  Due to his significant symptoms, we Nayeliz Hipp start him on amiodarone for short time around the time of ablation.  This patients CHA2DS2-VASc Score and unadjusted Ischemic Stroke Rate (% per year) is equal to 3.2 % stroke rate/year from a score of 3  Above score calculated as 1 point each if present [CHF, HTN, DM, Vascular=MI/PAD/Aortic Plaque, Age if 65-74, or Male] Above score calculated as 2 points each if present [Age > 75, or Stroke/TIA/TE]   2.  Abnormal stress test: May need left heart catheterization.  Currently no angina.  3.  Morbid obesity: Encouraged diet, exercise, and weight loss.  Does have a sleep study ordered plan for 01/02/2018.  Have encouraged compliance with likely CPAP.  4.  Hypertension: Well-controlled.  No changes.  Current medicines are reviewed at length with the patient today.   The patient does not have concerns regarding his medicines.  The following changes were made today: Start amiodarone  Labs/ tests ordered today include:  Orders Placed  This Encounter  Procedures  . EKG 12-Lead   Case discussed with primary cardiology  Disposition:   FU with Ilhan Madan 1 months  Signed, Cheyane Ayon Meredith Leeds, MD  12/21/2017 2:02 PM     [place] [address] [place]  [place] [address] [phone] (office) [phone] (fax)

## 2017-12-21 NOTE — Patient Instructions (Signed)
Medication Instructions:  Your physician has recommended you make the following change in your medication:  1. START Amiodarone:  - take 2 tablets (400 mg total) twice daily for 2 weeks, then  - take 1 tablet (200 mg total) twice daily for 2 weeks, then  - take 1 tablet (200 mg total) once daily  Labwork: None ordered     *We will only notify you of abnormal results, otherwise continue current treatment plan.  Testing/Procedures: Your physician has requested that you have cardiac CT within 7 days PRIOR to your procedure. Cardiac computed tomography (CT) is a painless test that uses an x-ray machine to take clear, detailed pictures of your heart. For further information please visit [email or web address]. Please follow instruction sheet as given.   Your physician has recommended that you have an ablation. Catheter ablation is a medical procedure used to treat some cardiac arrhythmias (irregular heartbeats). During catheter ablation, a long, thin, flexible tube is put into a blood vessel in your groin (upper thigh), or neck. This tube is called an ablation catheter. It is then guided to your heart through the blood vessel. Radio frequency waves destroy small areas of heart tissue where abnormal heartbeats may cause an arrhythmia to start. Please see the instruction sheet given to you today.  Instructions for your ablation: 1. Please arrive at the [facility], Main Entrance "A", of [facility] at ______  on ________. 2. Do not eat or drink after midnight the night prior to the procedure. 3. Do not miss any doses of ELIQUIS prior to the morning of the procedure.  4. Do not take any medications the morning of the procedure. 5. Both of your groins will need to be shaved for this procedure (if needed). We ask that you do this yourself at home 1-2 days prior to the procedure.  If you are unable/uncomfortable to do yourself, the hospital staff will shave you the day of your procedure (if  needed). 6. Plan for an overnight stay in the hospital. 7. You will need someone to drive you home at discharge.   Follow-Up: Your physician recommends that you schedule a follow-up appointment in: 4 weeks, after your procedure on _____, with Roderic Palau in the AFib clinic.  Your physician recommends that you schedule a follow-up appointment in: 3 months, after your procedure on ______, with Dr. Curt Bears.   * If you need a refill on your cardiac medications before your next appointment, please call your pharmacy.   *Please note that any paperwork needing to be filled out by the provider will need to be addressed at the front desk prior to seeing the provider. Please note that any FMLA, disability or other documents regarding health condition is subject to a $25.00 charge that must be received prior to completion of paperwork in the form of a money order or check.  Thank you for choosing [facility]!!   Trinidad Curet, RN [phone]  Any Other Special Instructions Will Be Listed Below (If Applicable).  Please arrive at the [facility] main entrance of [facility] at xx:xx AM (30-45 minutes prior to test start time)  [facility] [place], [place] [address] [phone]  Proceed to the [facility] Radiology Department (First Floor).  Please follow these instructions carefully (unless otherwise directed):  Hold all erectile dysfunction medications at least 48 hours prior to test.  On the Night Before the Test: . Drink plenty of water. . Do not consume any  caffeinated/decaffeinated beverages or chocolate 12 hours prior to your test. . Do not take any antihistamines 12 hours prior to your test. . If you take Metformin do not take 24 hours prior to test. . If the patient has contrast allergy: ? Patient will need a prescription for Prednisone and very clear instructions (as follows): 1. Prednisone 50 mg - take 13 hours prior to test 2. Take  another Prednisone 50 mg 7 hours prior to test 3. Take another Prednisone 50 mg 1 hour prior to test 4. Take Benadryl 50 mg 1 hour prior to test . Patient must complete all four doses of above prophylactic medications. . Patient will need a ride after test due to Benadryl.  On the Day of the Test: . Drink plenty of water. Do not drink any water within one hour of the test. . Do not eat any food 4 hours prior to the test. . You may take your regular medications prior to the test. . IF NOT ON A BETA BLOCKER - Take 50 mg of lopressor (metoprolol) one hour before the test. . HOLD Furosemide morning of the test.  After the Test: . Drink plenty of water. . After receiving IV contrast, you may experience a mild flushed feeling. This is normal. . On occasion, you may experience a mild rash up to 24 hours after the test. This is not dangerous. If this occurs, you can take Benadryl 25 mg and increase your fluid intake. . If you experience trouble breathing, this can be serious. If it is severe call 911 IMMEDIATELY. If it is mild, please call our office. . If you take any of these medications: Glipizide/Metformin, Avandament, Glucavance, please do not take 48 hours after completing test.

## 2017-12-24 ENCOUNTER — Telehealth: Payer: Self-pay | Admitting: Family Medicine

## 2017-12-24 MED ORDER — RAMELTEON 8 MG PO TABS: 8.0000 mg | ORAL_TABLET | Freq: Every day | ORAL | 5 refills | Status: DC

## 2017-12-24 NOTE — Telephone Encounter (Signed)
From cardiology  "From: Stanton Kidney, RN Sent: 12/23/2017   2:21 PM To: Stanton Kidney, RN, Marin Olp, MD Subject: D/C med??                                      Dr. Yong Channel,  Mutual patient seen on 6/25 by Dr. Curt Bears (Electrophysiologist).  He recommends pt start Amiodarone for Atrial Flutter.  However, pt is on 2 medications that may prolong QT when taken w/ Amiodarone -- these are Lexapro and Trazadone.   We informed patient that he may remain on 1 of them but not both.  Preference is to stay on low dose Lexapro.  Pt prefers to see if there alternative to Trazadone for sleep.  Please advise  Thank You, Trinidad Curet, RN"  I stopped trazodone- I sent in ramelteon as alternate. If this is not effective have him trial off- possible he can sleep with just the nortiptyline he is on for his neck. Could also consider belsomra but that is expensive. Hope[place] ramelteon not too costly.   [name]

## 2017-12-24 NOTE — Telephone Encounter (Signed)
See note

## 2017-12-24 NOTE — Telephone Encounter (Signed)
Copied from [place] [id number]. Topic: Quick Communication - See Telephone Encounter >> Dec 24, 2017  3:42 PM Percell Belt A wrote: CRM for notification. See Telephone encounter for: 12/24/17.  Pt called in and stated that the ramelteon (ROZEREM) 8 MG tablet [[id number] that was called in is $145.00 he stated that he was not going to pick it up and would like to know what other options there are?    Best number [phone]

## 2017-12-24 NOTE — Telephone Encounter (Signed)
Called and spoke to patient who verbalized understanding  

## 2017-12-27 MED ORDER — SUVOREXANT 10 MG PO TABS: 10.0000 mg | ORAL_TABLET | Freq: Every day | ORAL | 5 refills | Status: DC

## 2017-12-27 NOTE — Telephone Encounter (Signed)
I spoke with PEC who stated patient called upset that he has not received a call back about his medication. PEC states that he hung up before being able to get someone on the line to assist from our office. Please call patient as soon as possible to discuss.

## 2017-12-27 NOTE — Telephone Encounter (Signed)
Sent in [place] to trial. If this is too expensive have him sit down with me for a visit to discuss. Other meds are riskier than I would like to use. He should not use trazodone, lexapro, amiodarone together but doesn't look like hes on amiodarone yet.   [name]

## 2017-12-27 NOTE — Telephone Encounter (Signed)
I had spoken to patient on Friday and relayed the following message from Dr. Yong Channel:  I stopped trazodone- I sent in ramelteon as alternate. If this is not effective have him trial off- possible he can sleep with just the nortiptyline he is on for his neck. Could also consider belsomra but that is expensive. [place] ramelteon not too costly.   [name]  He had verbalized understanding. I spoke with patient again today who is upset at the cost of the new medication and wants to know if there is anything less expensive? I explained we do not know when sending in prescriptions what the cost will be for the patient. I did advise that he could trial using just the nortiptylie and he stated he would still continue using the trazadone until something else was called in for him. I also offered the [place] but the belief is that the [place] will be more expensive.

## 2017-12-29 NOTE — Telephone Encounter (Signed)
Called and spoke to patient who verbalized understanding  

## 2017-12-31 ENCOUNTER — Telehealth: Payer: Self-pay | Admitting: *Deleted

## 2017-12-31 ENCOUNTER — Other Ambulatory Visit: Payer: Self-pay | Admitting: Family Medicine

## 2017-12-31 DIAGNOSIS — Z01812 Encounter for preprocedural laboratory examination: Secondary | ICD-10-CM

## 2017-12-31 DIAGNOSIS — I4891 Unspecified atrial fibrillation: Secondary | ICD-10-CM

## 2017-12-31 DIAGNOSIS — I480 Paroxysmal atrial fibrillation: Secondary | ICD-10-CM

## 2017-12-31 MED ORDER — AMIODARONE HCL 200 MG PO TABS: ORAL_TABLET | ORAL | 0 refills | Status: DC

## 2017-12-31 NOTE — Telephone Encounter (Signed)
lmtcb to update me on sleeping medication change in order to begin Amiodarone.

## 2017-12-31 NOTE — Telephone Encounter (Signed)
Pt returns my call.  He informs me that he has stopped Trazadone.  He will start taking Belsomra tonight.  Pt is currently out right now and cannot discuss Amiodarone instructions. Pt will pick up Rx this weekend.   He understands I will call him Monday (when he will be at home) to instruct on starting Amiodarone and reviewing medication and any questions. Pt understands not to start Amiodarone until we speak Monday.

## 2018-01-02 ENCOUNTER — Ambulatory Visit (HOSPITAL_BASED_OUTPATIENT_CLINIC_OR_DEPARTMENT_OTHER): Payer: Medicare Other | Attending: Interventional Cardiology | Admitting: Cardiovascular Disease

## 2018-01-02 VITALS — Ht 70.0 in | Wt 290.0 lb

## 2018-01-02 DIAGNOSIS — I4892 Unspecified atrial flutter: Secondary | ICD-10-CM

## 2018-01-02 DIAGNOSIS — G4733 Obstructive sleep apnea (adult) (pediatric): Secondary | ICD-10-CM | POA: Diagnosis not present

## 2018-01-02 DIAGNOSIS — G4730 Sleep apnea, unspecified: Secondary | ICD-10-CM | POA: Diagnosis present

## 2018-01-03 NOTE — Telephone Encounter (Signed)
Pt tells me that pharmacy had to order his Amiodarone.  Pt will call me back when he receives medication and go over instructions then, per his request.

## 2018-01-05 ENCOUNTER — Encounter (HOSPITAL_BASED_OUTPATIENT_CLINIC_OR_DEPARTMENT_OTHER): Payer: Self-pay | Admitting: Cardiovascular Disease

## 2018-01-05 ENCOUNTER — Telehealth: Payer: Self-pay | Admitting: Interventional Cardiology

## 2018-01-05 NOTE — Telephone Encounter (Signed)
Spoke with pt and went over amiodarone loading instructions with him. Pt verbalized understanding of loading and tapering doses.   Pt needs a call back, he would like to review AF ablation scheduling and instructions with [facility] RN. I told I would forward [facility] RN a message to call him back tomorrow (7/11).

## 2018-01-05 NOTE — Telephone Encounter (Signed)
Follow Up:   Returning Sherri's call from yesterday,please have her call him tomorrow.

## 2018-01-05 NOTE — Procedures (Signed)
Patient Name: Wayne Booth, Wayne Booth Date: 01/02/2018 Gender: Male D.O.B: 06/23/1971 Age (years): 46 Referring Provider: Larae Grooms Height (inches): 64 Interpreting Physician: Shelva Majestic MD, ABSM Weight (lbs): 290 RPSGT: Zadie Rhine BMI: 42 MRN: [id number] Neck Size: 22.00  CLINICAL INFORMATION Sleep Study Type: Split Night CPAP  Indication for sleep study: OSA, Daytime sleepiness, HTN, Atrial flutter  Epworth Sleepiness Score: 14  SLEEP STUDY TECHNIQUE As per the AASM Manual for the Scoring of Sleep and Associated Events v2.3 (April 2016) with a hypopnea requiring 4% desaturations.  The channels recorded and monitored were frontal, central and occipital EEG, electrooculogram (EOG), submentalis EMG (chin), nasal and oral airflow, thoracic and abdominal wall motion, anterior tibialis EMG, snore microphone, electrocardiogram, and pulse oximetry. Continuous positive airway pressure (CPAP) was initiated when the patient met split night criteria and was titrated according to treat sleep-disordered breathing.  MEDICATIONS     albuterol (PROVENTIL HFA;VENTOLIN HFA) 108 (90 Base) MCG/ACT inhaler             allopurinol (ZYLOPRIM) 300 MG tablet         amiodarone (PACERONE) 200 MG tablet         apixaban (ELIQUIS) 5 MG TABS tablet         budesonide-formoterol (SYMBICORT) 160-4.5 MCG/ACT inhaler         clotrimazole-betamethasone (LOTRISONE) cream         colchicine 0.6 MG tablet         cyclobenzaprine (FLEXERIL) 10 MG tablet         diltiazem (CARDIZEM CD) 240 MG 24 hr capsule         escitalopram (LEXAPRO) 20 MG tablet         furosemide (LASIX) 20 MG tablet         levothyroxine (SYNTHROID, LEVOTHROID) 150 MCG tablet         metoprolol tartrate (LOPRESSOR) 25 MG tablet         Multiple Vitamins-Minerals (MULTIVITAMINS THER. W/MINERALS) TABS         nitroGLYCERIN (NITROSTAT) 0.4 MG SL tablet         nortriptyline (PAMELOR) 75 MG capsule         Suvorexant  (BELSOMRA) 10 MG TABS      Medications self-administered by patient taken the night of the study : TRAZODONE  RESPIRATORY PARAMETERS Diagnostic Total AHI (/hr): 98.3 RDI (/hr): 106.2 OA Index (/hr): 26.8 CA Index (/hr): 0.0 REM AHI (/hr): N/A NREM AHI (/hr): 98.3 Supine AHI (/hr): 98.3 Non-supine AHI (/hr): N/A Min O2 Sat (%): 83.0 Mean O2 (%): 90.9 Time below 88% (min): 24.3   Titration Optimal Pressure (cm): 17 AHI at Optimal Pressure (/hr): 0.0 Min O2 at Optimal Pressure (%): 89.0 Supine % at Optimal (%): 100 Sleep % at Optimal (%): 89   SLEEP ARCHITECTURE The recording time for the entire night was 394.1 minutes.  During a baseline period of 145.3 minutes, the patient slept for 143.5 minutes in REM and nonREM, yielding a sleep efficiency of 98.8%%. Sleep onset after lights out was 0.5 minutes with a REM latency of N/A minutes. The patient spent 3.5%% of the night in stage N1 sleep, 96.5%% in stage N2 sleep, 0.0%% in stage N3 and 0.0%% in REM.  During the titration period of 241.0 minutes, the patient slept for 232.0 minutes in REM and nonREM, yielding a sleep efficiency of 96.3%%. Sleep onset after CPAP initiation was 5.4 minutes with a REM latency of 75.0 minutes. The patient spent  1.3%% of the night in stage N1 sleep, 86.6%% in stage N2 sleep, 0.0%% in stage N3 and 12.1%% in REM.  CARDIAC DATA The 2 lead EKG demonstrated sinus rhythm. The mean heart rate was 100.0 beats per minute. Other EKG findings include: PVCs.  LEG MOVEMENT DATA The total Periodic Limb Movements of Sleep (PLMS) were 0. The PLMS index was 0.0 .  IMPRESSIONS - Severe obstructive sleep apnea occurred during the diagnostic portion of the study (AHI 98.3/hour; RDI 106.2/hour). An optimal PAP pressure was selected for this patient ( 17 cm of water) - No significant central sleep apnea occurred during the diagnostic portion of the study (CAI  0.0/hour). - Severe oxygen desaturation was noted during the diagnostic  portion of the study to a nadir of 83%. 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 - Elimination of mild snoring with CPAP therapy. - EKG findings include PVCs. - Clinically significant periodic limb movements did not occur during sleep.  DIAGNOSIS - Obstructive Sleep Apnea (327.23 [G47.33 ICD-10])  RECOMMENDATIONS - Recommend an initial trial of CPAP therapy with EPR of 3 at 17 cm H2O with heated humidification.  A medium size Resmed Full Face Mask AirFit F30 mask was used for the titration (the patient is a mouth (breather). - Efforts should be made to optimize nasal and oropharyngeal patency. - Avoid alcohol, sedatives and other CNS depressants that may worsen sleep apnea and disrupt normal sleep architecture. - Sleep hygiene should be reviewed to assess factors that may improve sleep quality. - Weight management (BMI 42) and regular exercise should be initiated. - Recommend a download be obtained in 30 days and sleep clinic evaluation after 4 weeks of therapy  [Electronically signed] 01/05/2018 12:28 PM  Shelva Majestic MD, [facility], ABSM Diplomate, American Board of Sleep Medicine  NPI: [id number]  [place] PH: [phone]   FX: [phone] [place]

## 2018-01-05 NOTE — Telephone Encounter (Signed)
New Message:     Pt wants to know if his Sleep Study result is back from Sunday please.

## 2018-01-05 NOTE — Telephone Encounter (Signed)
-----   Message from Troy Sine, MD sent at 01/05/2018 12:34 PM EDT ----- Mariann Laster,  Please contact patient and set up with CPAP with DME and f/u sleep clinic

## 2018-01-05 NOTE — Telephone Encounter (Signed)
Left message to return a call to discuss sleep results and recommendations.

## 2018-01-05 NOTE — Telephone Encounter (Signed)
Barry Brunner has received information and is looking into message.  Thanks!

## 2018-01-06 NOTE — Telephone Encounter (Signed)
Patient returned a call. Notified of sleep study results and recommendations. CPAP referral  Sent to [place].

## 2018-01-06 NOTE — Progress Notes (Signed)
Patient notified

## 2018-01-07 NOTE — Telephone Encounter (Signed)
Spoke to pt yesterday and informed him I would call today.  Scheduled ablation for 8/2. Pt understands office will call him to arrange CT w/i 7 days PRIOR to ablation.  He and I agreed to speak the week of 7/22 to go over ablation and CT instructions again.

## 2018-01-11 DIAGNOSIS — G4733 Obstructive sleep apnea (adult) (pediatric): Secondary | ICD-10-CM | POA: Diagnosis not present

## 2018-01-18 ENCOUNTER — Ambulatory Visit (INDEPENDENT_AMBULATORY_CARE_PROVIDER_SITE_OTHER): Payer: Medicare Other | Admitting: Family Medicine

## 2018-01-18 ENCOUNTER — Encounter: Payer: Self-pay | Admitting: Family Medicine

## 2018-01-18 VITALS — BP 118/80 | HR 75 | Temp 98.3°F | Ht 70.0 in | Wt 293.2 lb

## 2018-01-18 DIAGNOSIS — K5900 Constipation, unspecified: Secondary | ICD-10-CM | POA: Diagnosis not present

## 2018-01-18 DIAGNOSIS — G250 Essential tremor: Secondary | ICD-10-CM

## 2018-01-18 DIAGNOSIS — I5032 Chronic diastolic (congestive) heart failure: Secondary | ICD-10-CM

## 2018-01-18 NOTE — Patient Instructions (Addendum)
For constipation- try a half capful of miralax every other day for 2 weeks (can be store brand). If this gets you to at least 2-3x a week can maintain at that level. If not, go to half capful daily for at least 2 weeks. If not improving- see me back or reach out to [place].   Lets hold off on changing your metoprolol until we see how your heart rate is doing after the procedure. I will let Dr. Scarlette Calico know that I would like to increase the dose if your heart rate tolerates it.   Take lasix- two pills per day (an extra one today) then take 2 together on Wednesday, Thursday, Friday. See me back on Friday at 1 PM for a recheck. Ok to use same day slot.

## 2018-01-18 NOTE — Assessment & Plan Note (Signed)
S: has been swelling more for last week or so. Has gained weight in last week. Had been down to 283- now up 4 lbs. He thought was related to stool burden. More short of breath since swelling has increased.  A/P: edema increase, weight increase, crackles at bases- will place patient on lasix 40mg  daily through Friday and have him back- will repeat bmet at that time particularly with unilateral kidney- holding off on potassium for now- hoping ot avoid fluid overload particularly before ablation

## 2018-01-18 NOTE — Assessment & Plan Note (Signed)
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   When he is active he notes HR gets up to 120 pretty easily on his watch. At rest usually in 22s.  A/P: from avs  "Lets hold off on changing your metoprolol until we see how your heart rate is doing after the procedure. I will let Dr. Irish Lack know that I would like to increase the dose if your heart rate tolerates it. " I sent Dr. Irish Lack a message asking if he would be ok with increase at follow up to 25mg  BID metoprolol

## 2018-01-18 NOTE — Progress Notes (Signed)
Subjective:  Wayne Booth is a 77 y.o. year old very pleasant male patient who presents for/with See problem oriented charting ROS- leg swelling up, hand tremor worse. No chest pain reported. Shortness of breath has worsened.    Past Medical History-  Patient Active Problem List   Diagnosis Date Noted  . (HFpEF) heart failure with preserved ejection fraction ([place]) 12/16/2017    Priority: High  . Chest pain     Priority: High  . Atrial flutter ([place]) 11/01/2017    Priority: High  . Shortness of breath 06/14/2017    Priority: High  . History of pulmonary embolism 10/23/2016    Priority: High  . 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 History of renal cell carcinoma 12/26/2007    Priority: High  . History of adenomatous polyp of colon 04/12/2017    Priority: Medium  . Cervical disc disease 11/13/2015    Priority: Medium  . Essential tremor 08/14/2015    Priority: Medium  . Gout 08/14/2015    Priority: Medium  . Hypothyroidism 07/12/2014    Priority: Medium  . Hyperglycemia 07/12/2014    Priority: Medium  . GAD (generalized anxiety disorder) 11/07/2013    Priority: Medium  . OSA (obstructive sleep apnea) 07/07/2011    Priority: Medium  . Morbid obesity ([place]) 10/02/2008    Priority: Medium  . Essential hypertension 12/28/2006    Priority: Medium  . Anticoagulated     Priority: Low  . Community acquired pneumonia 06/18/2017    Priority: Low  . Asthmatic bronchitis 06/09/2011    Priority: Low  . Actinic keratosis 11/27/2009    Priority: Low  . Osteoarthritis 11/13/2008    Priority: Low  . TESTOSTERONE DEFICIENCY 12/28/2006    Priority: Low  . Bilateral foot pain 07/13/2017    Medications- reviewed and updated Current Outpatient Medications  Medication Sig Dispense Refill  . albuterol (PROVENTIL HFA;VENTOLIN HFA) 108 (90 Base) MCG/ACT inhaler Inhale 2 puffs into the lungs every 6 (six) hours as needed for wheezing or shortness of breath. 1 Inhaler  5  . allopurinol (ZYLOPRIM) 300 MG tablet Take 0.5 tablets (150 mg total) by mouth daily. 30 tablet 3  . amiodarone (PACERONE) 200 MG tablet Take 2 tablets (400 mg total) twice a day for two weeks.  Then take 1 tablet (200 mg total) twice a day for two weeks. 84 tablet 0  . apixaban (ELIQUIS) 5 MG TABS tablet Take 1 tablet (5 mg total) by mouth 2 (two) times daily. 60 tablet 1  . budesonide-formoterol (SYMBICORT) 160-4.5 MCG/ACT inhaler Inhale 2 puffs into the lungs 2 (two) times daily as needed (for respiratory flares).    . clotrimazole-betamethasone (LOTRISONE) cream Apply 1 application topically 2 (two) times daily. Use for 10 days maximum. (Patient taking differently: Apply 1 application topically 2 (two) times daily as needed (for rashes). ) 45 g 1  . colchicine 0.6 MG tablet Take 1 tablet (0.6 mg total) by mouth daily. 30 tablet 3  . cyclobenzaprine (FLEXERIL) 10 MG tablet Take 10 mg by mouth daily as needed for muscle spasms.     Marland Kitchen diltiazem (CARDIZEM CD) 240 MG 24 hr capsule Take 1 capsule (240 mg total) by mouth daily. 30 capsule 3  . escitalopram (LEXAPRO) 20 MG tablet Take 1 tablet (20 mg total) by mouth daily. TAKE 20 mg every day (Patient taking differently: Take 20 mg by mouth daily. ) 90 tablet 3  . furosemide (LASIX) 20 MG tablet Take 1  tablet (20 mg total) by mouth daily. 30 tablet 3  . levothyroxine (SYNTHROID, LEVOTHROID) 150 MCG tablet TAKE ONE TABLET BY MOUTH DAILY 90 tablet 2  . metoprolol tartrate (LOPRESSOR) 25 MG tablet Take 0.5 tablets (12.5 mg total) by mouth 2 (two) times daily. 60 tablet 3  . Multiple Vitamins-Minerals (MULTIVITAMINS THER. W/MINERALS) TABS Take 1 tablet by mouth daily.    . nitroGLYCERIN (NITROSTAT) 0.4 MG SL tablet Place 1 tablet (0.4 mg total) under the tongue every 5 (five) minutes as needed for chest pain. 30 tablet 12  . nortriptyline (PAMELOR) 75 MG capsule TAKE ONE CAPSULE BY MOUTH AT BEDTIME 90 capsule 1  . Suvorexant (BELSOMRA) 10 MG TABS Take  10 mg by mouth at bedtime. 30 tablet 5   No current facility-administered medications for this visit.     Objective: BP 118/80 (BP Location: Left Arm, Patient Position: Sitting, Cuff Size: Large)   Pulse 75   Temp 98.3 F (36.8 C) (Oral)   Ht 5\' 10"  (1.778 m)   Wt 293 lb 3.2 oz (133 kg)   SpO2 96%   BMI 42.07 kg/m  Gen: NAD, resting comfortably CV: RRR no murmurs rubs or gallops Lungs: faint bibasilar crackles. no wheeze, rhonchi Abdomen: soft/nontender/nondistended/normal bowel sounds. No rebound or guarding.  Ext: 1+ edema Skin: warm, dry  Assessment/Plan:  Constipation S: patient has been dealing with constipation for 2-3 weeks. Stools arent hard.   Since getting out of the hospital his BMs are about once a week and used to be everyday. He is eating well, cut out soft drinks. Other days passes water and gas mainly. Already doing high fiber cereals. Eating prunes A/P: more sedentary activity likely worsens this issue From avs  "For constipation- try a half capful of miralax every other day for 2 weeks (can be store brand). If this gets you to at least 2-3x a week can maintain at that level. If not, go to half capful daily for at least 2 weeks. If not improving- see me back or reach out to [place]. "  Could also consider checking t3, t4 at follow up.   Essential tremor S:  he has also noted worsening hand tremor R >L on metoprolol 12.5mg  BID instead of propranolol 10mg  BID.   When he is active he notes HR gets up to 120 pretty easily on his watch. At rest usually in 57s.  A/P: from avs  "Lets hold off on changing your metoprolol until we see how your heart rate is doing after the procedure. I will let Dr. Irish Lack know that I would like to increase the dose if your heart rate tolerates it. " I sent Dr. Irish Lack a message asking if he would be ok with increase at follow up to 25mg  BID metoprolol  (HFpEF) heart failure with preserved ejection fraction ([place]) S: has been swelling  more for last week or so. Has gained weight in last week. Had been down to 283- now up 4 lbs. He thought was related to stool burden. More short of breath since swelling has increased.  A/P: edema increase, weight increase, crackles at bases- will place patient on lasix 40mg  daily through Friday and have him back- will repeat bmet at that time particularly with unilateral kidney- holding off on potassium for now- hoping ot avoid fluid overload particularly before ablation   Future Appointments  Date Time Provider [place]  01/21/2018  1:00 PM Marin Olp, MD [facility]  01/26/2018  2:30 PM MC-CT  1 [facility]  01/26/2018  3:00 PM MC-CT 1 [facility]  03/01/2018  8:45 AM Marin Olp, MD [facility]  03/17/2018 10:40 AM Jettie Booze, MD [facility]  04/04/2018  9:20 AM Jettie Booze, MD [facility]  04/11/2018  8:40 AM Troy Sine, MD [facility]   Return precautions advised.  [name], MD

## 2018-01-20 ENCOUNTER — Encounter: Payer: Self-pay | Admitting: *Deleted

## 2018-01-20 NOTE — Addendum Note (Signed)
Addended by: Stanton Kidney on: 01/20/2018 05:17 PM   Modules accepted: Orders

## 2018-01-20 NOTE — Telephone Encounter (Signed)
Instruction letter for ablation reviewed w/ pt and left at front desk for pick up. CT instruction letter review w/ pt and left at front desk also. Pre procedure labs scheduled for tomorrow. Pt prefers to f/u w/ Dr Curt Bears post ablation.  Scheduled for 9/3. Patient verbalized understanding and agreeable to plan.

## 2018-01-21 ENCOUNTER — Ambulatory Visit (INDEPENDENT_AMBULATORY_CARE_PROVIDER_SITE_OTHER): Payer: Medicare Other | Admitting: Family Medicine

## 2018-01-21 ENCOUNTER — Other Ambulatory Visit: Payer: Medicare Other | Admitting: *Deleted

## 2018-01-21 ENCOUNTER — Encounter: Payer: Self-pay | Admitting: Family Medicine

## 2018-01-21 VITALS — BP 116/68 | HR 83 | Temp 98.3°F | Ht 70.0 in | Wt 292.4 lb

## 2018-01-21 DIAGNOSIS — I480 Paroxysmal atrial fibrillation: Secondary | ICD-10-CM

## 2018-01-21 DIAGNOSIS — Z01812 Encounter for preprocedural laboratory examination: Secondary | ICD-10-CM | POA: Diagnosis not present

## 2018-01-21 DIAGNOSIS — I5032 Chronic diastolic (congestive) heart failure: Secondary | ICD-10-CM

## 2018-01-21 DIAGNOSIS — K5900 Constipation, unspecified: Secondary | ICD-10-CM

## 2018-01-21 LAB — BASIC METABOLIC PANEL
BUN / CREAT RATIO: 12 (ref 10–24)
BUN: 15 mg/dL (ref 8–27)
CHLORIDE: 97 mmol/L (ref 96–106)
CO2: 24 mmol/L (ref 20–29)
Calcium: 8.8 mg/dL (ref 8.6–10.2)
Creatinine, Ser: 1.29 mg/dL — ABNORMAL HIGH (ref 0.76–1.27)
GFR calc Af Amer: 62 mL/min/{1.73_m2} (ref >59)
GFR calc non Af Amer: 54 mL/min/{1.73_m2} — ABNORMAL LOW (ref >59)
GLUCOSE: 82 mg/dL (ref 65–99)
Potassium: 4.7 mmol/L (ref 3.5–5.2)
Sodium: 136 mmol/L (ref 134–144)

## 2018-01-21 LAB — CBC
HEMOGLOBIN: 16 g/dL (ref 13.0–17.7)
Hematocrit: 46.7 % (ref 37.5–51.0)
MCH: 31 pg (ref 26.6–33.0)
MCHC: 34.3 g/dL (ref 31.5–35.7)
MCV: 91 fL (ref 79–97)
PLATELETS: 341 10*3/uL (ref 150–450)
RBC: 5.16 x10E6/uL (ref 4.14–5.80)
RDW: 14.4 % (ref 12.3–15.4)
WBC: 9.6 10*3/uL (ref 3.4–10.8)

## 2018-01-21 NOTE — Patient Instructions (Addendum)
I would also like for you to sign up for an annual wellness visit on 04/26/18 or later with one of our nurses, Cassie or Manuela Schwartz, who both specialize in the annual wellness visit. This is a free benefit under medicare that may help [place] find additional ways to help you. Some highlights are reviewing medications, lifestyle, and doing a dementia screen.  Please check with your pharmacy to see if they have the shingrix vaccine. If they do- please get this immunization and update [place] by phone call or mychart with dates you receive the vaccine. I would wait to do this until after your procedure and things are stable.   ______________________________________________________  If your swelling worsens again or you get short of breath again like you were- do another 3 days of 40mg  lasix instead of 20mg . Continue 20mg  lasix for now  Glad constipation is better- reasonable to continue half capful every other day for now as long as at least 2-3 bowel movements a week

## 2018-01-21 NOTE — Assessment & Plan Note (Addendum)
S: patient having constipation for 2-3 weeks with BM about once a week down from daily. He was already doing high fiber cereal, eating prunes. We placed him on half capful miralax every other day for 2 weeks- to go up to half capful darily if no better in 2 weeks.  A/P: continue miralax every other day half capful to keep BMs at least 2-3 days a week. He is up to date on colonoscopy as of 04/2017 with 3 year repeat- doubt cancerous lesion/cause. If this had not improved would have planned for t3, t4 today- given improvement we cancelled this.

## 2018-01-21 NOTE — Progress Notes (Addendum)
Subjective:  Wayne Booth is a 77 y.o. year old very pleasant male patient who presents for/with See problem oriented charting ROS- improved shortness of breath. No chest pain. Has had at least 2 bowel movements- so less constipated.    Past Medical History-  Patient Active Problem List   Diagnosis Date Noted  . (HFpEF) heart failure with preserved ejection fraction ([place]) 12/16/2017    Priority: High  . Chest pain     Priority: High  . Atrial flutter ([place]) 11/01/2017    Priority: High  . Shortness of breath 06/14/2017    Priority: High  . History of pulmonary embolism 10/23/2016    Priority: High  . 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 History of renal cell carcinoma 12/26/2007    Priority: High  . History of adenomatous polyp of colon 04/12/2017    Priority: Medium  . Cervical disc disease 11/13/2015    Priority: Medium  . Essential tremor 08/14/2015    Priority: Medium  . Gout 08/14/2015    Priority: Medium  . Hypothyroidism 07/12/2014    Priority: Medium  . Hyperglycemia 07/12/2014    Priority: Medium  . GAD (generalized anxiety disorder) 11/07/2013    Priority: Medium  . OSA (obstructive sleep apnea) 07/07/2011    Priority: Medium  . Morbid obesity ([place]) 10/02/2008    Priority: Medium  . Essential hypertension 12/28/2006    Priority: Medium  . Anticoagulated     Priority: Low  . Community acquired pneumonia 06/18/2017    Priority: Low  . Asthmatic bronchitis 06/09/2011    Priority: Low  . Actinic keratosis 11/27/2009    Priority: Low  . Osteoarthritis 11/13/2008    Priority: Low  . TESTOSTERONE DEFICIENCY 12/28/2006    Priority: Low  . Constipation 01/21/2018  . Bilateral foot pain 07/13/2017    Medications- reviewed and updated Current Outpatient Medications  Medication Sig Dispense Refill  . albuterol (PROVENTIL HFA;VENTOLIN HFA) 108 (90 Base) MCG/ACT inhaler Inhale 2 puffs into the lungs every 6 (six) hours as needed for  wheezing or shortness of breath. 1 Inhaler 5  . allopurinol (ZYLOPRIM) 300 MG tablet Take 0.5 tablets (150 mg total) by mouth daily. 30 tablet 3  . amiodarone (PACERONE) 200 MG tablet Take 2 tablets (400 mg total) twice a day for two weeks.  Then take 1 tablet (200 mg total) twice a day for two weeks. 84 tablet 0  . apixaban (ELIQUIS) 5 MG TABS tablet Take 1 tablet (5 mg total) by mouth 2 (two) times daily. 60 tablet 1  . budesonide-formoterol (SYMBICORT) 160-4.5 MCG/ACT inhaler Inhale 2 puffs into the lungs 2 (two) times daily as needed (for respiratory flares).    . clotrimazole-betamethasone (LOTRISONE) cream Apply 1 application topically 2 (two) times daily. Use for 10 days maximum. (Patient taking differently: Apply 1 application topically 2 (two) times daily as needed (for rashes). ) 45 g 1  . colchicine 0.6 MG tablet Take 1 tablet (0.6 mg total) by mouth daily. 30 tablet 3  . cyclobenzaprine (FLEXERIL) 10 MG tablet Take 10 mg by mouth daily as needed for muscle spasms.     Marland Kitchen diltiazem (CARDIZEM CD) 240 MG 24 hr capsule Take 1 capsule (240 mg total) by mouth daily. 30 capsule 3  . escitalopram (LEXAPRO) 20 MG tablet Take 1 tablet (20 mg total) by mouth daily. TAKE 20 mg every day (Patient taking differently: Take 20 mg by mouth daily. ) 90 tablet 3  . furosemide (  LASIX) 20 MG tablet Take 1 tablet (20 mg total) by mouth daily. 30 tablet 3  . levothyroxine (SYNTHROID, LEVOTHROID) 150 MCG tablet TAKE ONE TABLET BY MOUTH DAILY 90 tablet 2  . metoprolol tartrate (LOPRESSOR) 25 MG tablet Take 0.5 tablets (12.5 mg total) by mouth 2 (two) times daily. 60 tablet 3  . Multiple Vitamins-Minerals (MULTIVITAMINS THER. W/MINERALS) TABS Take 1 tablet by mouth daily.    . nitroGLYCERIN (NITROSTAT) 0.4 MG SL tablet Place 1 tablet (0.4 mg total) under the tongue every 5 (five) minutes as needed for chest pain. 30 tablet 12  . nortriptyline (PAMELOR) 75 MG capsule TAKE ONE CAPSULE BY MOUTH AT BEDTIME 90 capsule 1   . Suvorexant (BELSOMRA) 10 MG TABS Take 10 mg by mouth at bedtime. 30 tablet 5   No current facility-administered medications for this visit.     Objective: BP 116/68 (BP Location: Left Arm, Patient Position: Sitting, Cuff Size: Normal)   Pulse 83   Temp 98.3 F (36.8 C) (Oral)   Ht 5\' 10"  (1.778 m)   Wt 292 lb 6.4 oz (132.6 kg)   SpO2 94%   BMI 41.96 kg/m  Gen: NAD, resting comfortably CV: RRR no murmurs rubs or gallops Lungs: faint crackles at bases Abdomen: soft/nontender/nondistended/normal bowel sounds. No rebound or guarding.  Ext: slight improvement in  Edema- still 1+ Skin: warm, dry  Assessment/Plan:  (HFpEF) heart failure with preserved ejection fraction ([facility]) S:  the biggest reason for follow up today was his recent increase in edema, weight up 4 lbs and crackles at lung basis. He usually takes lasix 20mg  daily- we increased him to 40mg   Today 1. Weight- down 1 lb 2. Edema- improved 3. Shortness of breath - improved 4. Crackles - very slight at basis A/P: Fluid status appears improved though slightly. I actually would like to continue 40mg  lasix but he is tired of the increaased urination so we opted for 40mg  prn for edema increase, SOB increase, or weight gain. Continue 20mg  lasix - update bmet today (cancelled ours as had at cardiology)- does have unilateral kidney so want to watch carefully. We will add potassium if needed based on labs with increased diuretic use.   Constipation S: patient having constipation for 2-3 weeks with BM about once a week down from daily. He was already doing high fiber cereal, eating prunes. We placed him on half capful miralax every other day for 2 weeks- to go up to half capful darily if no better in 2 weeks.  A/P: continue miralax every other day half capful to keep BMs at least 2-3 days a week. He is up to date on colonoscopy as of 04/2017 with 3 year repeat- doubt cancerous lesion/cause. If this had not improved would have planned  for t3, t4 today- given improvement we cancelled this.   Patient knows im out of office next week but can see one of my colleagues if needed  Future Appointments  Date Time Provider [place]  01/26/2018  2:30 PM MC-CT 1 [facility]  01/26/2018  3:00 PM MC-CT 1 MC[facility]CT [facility]  02/18/2018 10:00 AM Marin Olp, MD [facility]  03/01/2018  8:45 AM Marin Olp, MD [facility]  03/01/2018 11:30 AM Constance Haw, MD [facility]  04/04/2018  9:20 AM Jettie Booze, MD [facility]  04/11/2018  8:40 AM Troy Sine, MD [facility]   Lab/Order associations: Chronic heart failure with preserved ejection fraction ([place]) - Plan: CANCELED: Basic metabolic  panel  Constipation, unspecified constipation type  Return precautions advised.  [name], MD

## 2018-01-21 NOTE — Assessment & Plan Note (Addendum)
S:  the biggest reason for follow up today was his recent increase in edema, weight up 4 lbs and crackles at lung basis. He usually takes lasix 20mg  daily- we increased him to 40mg   Today 1. Weight- down 1 lb 2. Edema- improved 3. Shortness of breath - improved 4. Crackles - very slight at basis A/P: Fluid status appears improved though slightly. I actually would like to continue 40mg  lasix but he is tired of the increaased urination so we opted for 40mg  prn for edema increase, SOB increase, or weight gain. Continue 20mg  lasix - update bmet today (cancelled ours as had at cardiology)- does have unilateral kidney so want to watch carefully. We will add potassium if needed based on labs with increased diuretic use.

## 2018-01-25 ENCOUNTER — Other Ambulatory Visit: Payer: Self-pay | Admitting: Cardiology

## 2018-01-25 ENCOUNTER — Other Ambulatory Visit: Payer: Self-pay | Admitting: Family Medicine

## 2018-01-25 NOTE — Telephone Encounter (Signed)
Pt's pharmacy is requesting a refill on amiodarone 200 mg tablet. Please clarify how pt is supposed to be taking this medication. Please address

## 2018-01-26 ENCOUNTER — Ambulatory Visit (HOSPITAL_COMMUNITY): Payer: Medicare Other | Attending: Cardiology

## 2018-01-26 ENCOUNTER — Ambulatory Visit (HOSPITAL_COMMUNITY): Admission: RE | Admit: 2018-01-26 | Payer: Medicare Other | Source: Ambulatory Visit

## 2018-01-27 ENCOUNTER — Telehealth: Payer: Self-pay | Admitting: Cardiology

## 2018-01-27 NOTE — Telephone Encounter (Signed)
NEW MESSAGE   Patient calling with questions regarding procedure 01/28/18. Patient did not have  CT scan 7/31

## 2018-01-27 NOTE — Telephone Encounter (Signed)
Discussed w/ Dr. Curt Bears. Pt feels like he in NSR. Pt confirms he has not missed any doses of blood thinner in the last month. Advised to arrive to hospital tomorrow at scheduled time for ablation procedure, per Dr. Curt Bears. Patient verbalized understanding and agreeable to plan.

## 2018-01-28 ENCOUNTER — Ambulatory Visit (HOSPITAL_COMMUNITY): Payer: Medicare Other | Admitting: Certified Registered Nurse Anesthetist

## 2018-01-28 ENCOUNTER — Ambulatory Visit (HOSPITAL_COMMUNITY)
Admission: RE | Admit: 2018-01-28 | Discharge: 2018-01-29 | Disposition: A | Discharge status: Home / Self Care | Payer: Medicare Other | Source: Ambulatory Visit | Attending: Cardiology | Admitting: Cardiology

## 2018-01-28 ENCOUNTER — Encounter (HOSPITAL_COMMUNITY): Payer: Self-pay | Admitting: Anesthesiology

## 2018-01-28 ENCOUNTER — Other Ambulatory Visit: Payer: Self-pay

## 2018-01-28 ENCOUNTER — Encounter (HOSPITAL_COMMUNITY)
Admission: RE | Disposition: A | Discharge status: Home / Self Care | Payer: Self-pay | Source: Ambulatory Visit | Attending: Cardiology

## 2018-01-28 DIAGNOSIS — Z85528 Personal history of other malignant neoplasm of kidney: Secondary | ICD-10-CM | POA: Insufficient documentation

## 2018-01-28 DIAGNOSIS — M1990 Unspecified osteoarthritis, unspecified site: Secondary | ICD-10-CM | POA: Insufficient documentation

## 2018-01-28 DIAGNOSIS — Z96652 Presence of left artificial knee joint: Secondary | ICD-10-CM | POA: Diagnosis not present

## 2018-01-28 DIAGNOSIS — I10 Essential (primary) hypertension: Secondary | ICD-10-CM | POA: Diagnosis not present

## 2018-01-28 DIAGNOSIS — Z809 Family history of malignant neoplasm, unspecified: Secondary | ICD-10-CM | POA: Insufficient documentation

## 2018-01-28 DIAGNOSIS — I480 Paroxysmal atrial fibrillation: Secondary | ICD-10-CM | POA: Diagnosis not present

## 2018-01-28 DIAGNOSIS — I4892 Unspecified atrial flutter: Secondary | ICD-10-CM | POA: Diagnosis not present

## 2018-01-28 DIAGNOSIS — Z79899 Other long term (current) drug therapy: Secondary | ICD-10-CM | POA: Diagnosis not present

## 2018-01-28 DIAGNOSIS — Z87891 Personal history of nicotine dependence: Secondary | ICD-10-CM | POA: Insufficient documentation

## 2018-01-28 DIAGNOSIS — Z8051 Family history of malignant neoplasm of kidney: Secondary | ICD-10-CM | POA: Diagnosis not present

## 2018-01-28 DIAGNOSIS — Z9889 Other specified postprocedural states: Secondary | ICD-10-CM | POA: Insufficient documentation

## 2018-01-28 DIAGNOSIS — E039 Hypothyroidism, unspecified: Secondary | ICD-10-CM | POA: Diagnosis not present

## 2018-01-28 DIAGNOSIS — Z9049 Acquired absence of other specified parts of digestive tract: Secondary | ICD-10-CM | POA: Diagnosis not present

## 2018-01-28 DIAGNOSIS — Z8546 Personal history of malignant neoplasm of prostate: Secondary | ICD-10-CM | POA: Diagnosis not present

## 2018-01-28 DIAGNOSIS — Z6839 Body mass index (BMI) 39.0-39.9, adult: Secondary | ICD-10-CM | POA: Insufficient documentation

## 2018-01-28 DIAGNOSIS — Z800 Family history of malignant neoplasm of digestive organs: Secondary | ICD-10-CM | POA: Insufficient documentation

## 2018-01-28 DIAGNOSIS — I483 Typical atrial flutter: Secondary | ICD-10-CM | POA: Diagnosis not present

## 2018-01-28 DIAGNOSIS — Z981 Arthrodesis status: Secondary | ICD-10-CM | POA: Diagnosis not present

## 2018-01-28 DIAGNOSIS — Z905 Acquired absence of kidney: Secondary | ICD-10-CM | POA: Diagnosis not present

## 2018-01-28 DIAGNOSIS — Z87442 Personal history of urinary calculi: Secondary | ICD-10-CM | POA: Diagnosis not present

## 2018-01-28 DIAGNOSIS — G4733 Obstructive sleep apnea (adult) (pediatric): Secondary | ICD-10-CM | POA: Diagnosis not present

## 2018-01-28 HISTORY — PX: ATRIAL FIBRILLATION ABLATION: EP1191

## 2018-01-28 HISTORY — PX: ABLATION OF DYSRHYTHMIC FOCUS: SHX254

## 2018-01-28 LAB — POCT ACTIVATED CLOTTING TIME
ACTIVATED CLOTTING TIME: 197 s
ACTIVATED CLOTTING TIME: 296 s
ACTIVATED CLOTTING TIME: 323 s
Activated Clotting Time: 263 seconds
Activated Clotting Time: 197 seconds
Activated Clotting Time: 213 seconds

## 2018-01-28 SURGERY — ATRIAL FIBRILLATION ABLATION
Anesthesia: General

## 2018-01-28 MED ORDER — SODIUM CHLORIDE 0.9% FLUSH: 3.0000 mL | INTRAVENOUS | Status: DC | PRN

## 2018-01-28 MED ORDER — PROTAMINE SULFATE 10 MG/ML IV SOLN
INTRAVENOUS | Status: DC | PRN
  Administered 2018-01-28: 40 mg via INTRAVENOUS

## 2018-01-28 MED ORDER — FENTANYL CITRATE (PF) 100 MCG/2ML IJ SOLN
INTRAMUSCULAR | Status: AC
  Filled 2018-01-28: qty 2

## 2018-01-28 MED ORDER — FLUTICASONE FUROATE-VILANTEROL 200-25 MCG/INH IN AEPB
1.0000 | INHALATION_SPRAY | Freq: Every day | RESPIRATORY_TRACT | Status: DC
  Administered 2018-01-29: 1 via RESPIRATORY_TRACT
  Filled 2018-01-28: qty 28

## 2018-01-28 MED ORDER — ONDANSETRON HCL 4 MG/2ML IJ SOLN
INTRAMUSCULAR | Status: DC | PRN
  Administered 2018-01-28: 4 mg via INTRAVENOUS

## 2018-01-28 MED ORDER — BUPIVACAINE HCL (PF) 0.25 % IJ SOLN
INTRAMUSCULAR | Status: AC
  Filled 2018-01-28: qty 30

## 2018-01-28 MED ORDER — DILTIAZEM HCL ER COATED BEADS 240 MG PO CP24
240.0000 mg | ORAL_CAPSULE | Freq: Every day | ORAL | Status: DC
  Administered 2018-01-28 – 2018-01-29 (×2): 240 mg via ORAL
  Filled 2018-01-28 (×2): qty 1

## 2018-01-28 MED ORDER — METOPROLOL TARTRATE 12.5 MG HALF TABLET
12.5000 mg | ORAL_TABLET | Freq: Two times a day (BID) | ORAL | Status: DC
  Administered 2018-01-28 – 2018-01-29 (×2): 12.5 mg via ORAL
  Filled 2018-01-28 (×2): qty 1

## 2018-01-28 MED ORDER — CYCLOBENZAPRINE HCL 10 MG PO TABS
10.0000 mg | ORAL_TABLET | Freq: Every day | ORAL | Status: DC | PRN
  Administered 2018-01-28: 18:00:00 10 mg via ORAL
  Filled 2018-01-28: qty 1

## 2018-01-28 MED ORDER — OFF THE BEAT BOOK
Freq: Once | Status: AC
  Administered 2018-01-28: 1
  Filled 2018-01-28: qty 1

## 2018-01-28 MED ORDER — SUGAMMADEX SODIUM 500 MG/5ML IV SOLN
INTRAVENOUS | Status: DC | PRN
  Administered 2018-01-28: 256.8 mg via INTRAVENOUS

## 2018-01-28 MED ORDER — FENTANYL CITRATE (PF) 100 MCG/2ML IJ SOLN
INTRAMUSCULAR | Status: DC | PRN
  Administered 2018-01-28 (×2): 25 ug via INTRAVENOUS

## 2018-01-28 MED ORDER — HEPARIN (PORCINE) IN NACL 1000-0.9 UT/500ML-% IV SOLN
INTRAVENOUS | Status: AC
  Filled 2018-01-28: qty 500

## 2018-01-28 MED ORDER — ADULT MULTIVITAMIN W/MINERALS CH
1.0000 | ORAL_TABLET | Freq: Every day | ORAL | Status: DC
  Administered 2018-01-28 – 2018-01-29 (×2): 1 via ORAL
  Filled 2018-01-28 (×2): qty 1

## 2018-01-28 MED ORDER — AMIODARONE HCL 200 MG PO TABS
200.0000 mg | ORAL_TABLET | Freq: Every day | ORAL | Status: DC
  Administered 2018-01-28 – 2018-01-29 (×2): 200 mg via ORAL
  Filled 2018-01-28 (×2): qty 1

## 2018-01-28 MED ORDER — ALBUTEROL SULFATE (2.5 MG/3ML) 0.083% IN NEBU: 3.0000 mL | INHALATION_SOLUTION | Freq: Four times a day (QID) | RESPIRATORY_TRACT | Status: DC | PRN

## 2018-01-28 MED ORDER — SUCCINYLCHOLINE CHLORIDE 20 MG/ML IJ SOLN
INTRAMUSCULAR | Status: DC | PRN
  Administered 2018-01-28: 160 mg via INTRAVENOUS

## 2018-01-28 MED ORDER — NITROGLYCERIN 0.4 MG SL SUBL: 0.4000 mg | SUBLINGUAL_TABLET | SUBLINGUAL | Status: DC | PRN

## 2018-01-28 MED ORDER — FENTANYL CITRATE (PF) 100 MCG/2ML IJ SOLN
25.0000 ug | Freq: Once | INTRAMUSCULAR | Status: AC
  Administered 2018-01-28 (×2): 25 ug via INTRAVENOUS

## 2018-01-28 MED ORDER — SODIUM CHLORIDE 0.9 % IV SOLN: 250.0000 mL | INTRAVENOUS | Status: DC | PRN

## 2018-01-28 MED ORDER — ACETAMINOPHEN 325 MG PO TABS
650.0000 mg | ORAL_TABLET | ORAL | Status: DC | PRN
  Filled 2018-01-28: qty 2

## 2018-01-28 MED ORDER — ALLOPURINOL 300 MG PO TABS
150.0000 mg | ORAL_TABLET | Freq: Every day | ORAL | Status: DC
  Administered 2018-01-28 – 2018-01-29 (×2): 150 mg via ORAL
  Filled 2018-01-28 (×2): qty 1

## 2018-01-28 MED ORDER — SODIUM CHLORIDE 0.9% FLUSH: 3.0000 mL | Freq: Two times a day (BID) | INTRAVENOUS | Status: DC

## 2018-01-28 MED ORDER — HEPARIN SODIUM (PORCINE) 1000 UNIT/ML IJ SOLN
INTRAMUSCULAR | Status: DC | PRN
  Administered 2018-01-28: 5000 [IU] via INTRAVENOUS
  Administered 2018-01-28: 8000 [IU] via INTRAVENOUS
  Administered 2018-01-28: 16000 [IU] via INTRAVENOUS
  Administered 2018-01-28: 2000 [IU] via INTRAVENOUS
  Administered 2018-01-28: 10000 [IU] via INTRAVENOUS

## 2018-01-28 MED ORDER — HEPARIN SODIUM (PORCINE) 1000 UNIT/ML IJ SOLN
INTRAMUSCULAR | Status: AC
  Filled 2018-01-28: qty 2

## 2018-01-28 MED ORDER — HEPARIN (PORCINE) IN NACL 1000-0.9 UT/500ML-% IV SOLN
INTRAVENOUS | Status: DC | PRN
  Administered 2018-01-28 (×5): 500 mL

## 2018-01-28 MED ORDER — ONDANSETRON HCL 4 MG/2ML IJ SOLN: 4.0000 mg | Freq: Four times a day (QID) | INTRAMUSCULAR | Status: DC | PRN

## 2018-01-28 MED ORDER — DOBUTAMINE IN D5W 4-5 MG/ML-% IV SOLN
INTRAVENOUS | Status: AC
  Filled 2018-01-28: qty 250

## 2018-01-28 MED ORDER — COLCHICINE 0.6 MG PO TABS
0.6000 mg | ORAL_TABLET | Freq: Every day | ORAL | Status: DC
  Administered 2018-01-28 – 2018-01-29 (×2): 0.6 mg via ORAL
  Filled 2018-01-28 (×2): qty 1

## 2018-01-28 MED ORDER — HEPARIN SODIUM (PORCINE) 1000 UNIT/ML IJ SOLN
INTRAMUSCULAR | Status: DC | PRN
  Administered 2018-01-28: 1000 [IU] via INTRAVENOUS

## 2018-01-28 MED ORDER — FENTANYL CITRATE (PF) 100 MCG/2ML IJ SOLN
25.0000 ug | INTRAMUSCULAR | Status: DC | PRN
  Administered 2018-01-28: 19:00:00 25 ug via INTRAVENOUS
  Filled 2018-01-28: qty 2

## 2018-01-28 MED ORDER — BUPIVACAINE HCL (PF) 0.25 % IJ SOLN
INTRAMUSCULAR | Status: DC | PRN
  Administered 2018-01-28: 30 mL

## 2018-01-28 MED ORDER — HEPARIN SODIUM (PORCINE) 1000 UNIT/ML IJ SOLN
INTRAMUSCULAR | Status: AC
  Filled 2018-01-28: qty 1

## 2018-01-28 MED ORDER — SODIUM CHLORIDE 0.9 % IV SOLN
INTRAVENOUS | Status: DC
  Administered 2018-01-28: 11:00:00 via INTRAVENOUS

## 2018-01-28 MED ORDER — LEVOTHYROXINE SODIUM 75 MCG PO TABS
150.0000 ug | ORAL_TABLET | Freq: Every day | ORAL | Status: DC
  Administered 2018-01-29: 07:00:00 150 ug via ORAL
  Filled 2018-01-28: qty 2

## 2018-01-28 MED ORDER — APIXABAN 5 MG PO TABS
5.0000 mg | ORAL_TABLET | Freq: Two times a day (BID) | ORAL | Status: DC
  Administered 2018-01-28 – 2018-01-29 (×2): 5 mg via ORAL
  Filled 2018-01-28 (×2): qty 1

## 2018-01-28 MED ORDER — NORTRIPTYLINE HCL 25 MG PO CAPS
75.0000 mg | ORAL_CAPSULE | Freq: Every day | ORAL | Status: DC
  Administered 2018-01-28: 75 mg via ORAL
  Filled 2018-01-28 (×2): qty 3

## 2018-01-28 MED ORDER — ESCITALOPRAM OXALATE 20 MG PO TABS
20.0000 mg | ORAL_TABLET | Freq: Every day | ORAL | Status: DC
  Administered 2018-01-29: 09:00:00 20 mg via ORAL
  Filled 2018-01-28: qty 1

## 2018-01-28 MED ORDER — PROPOFOL 10 MG/ML IV BOLUS
INTRAVENOUS | Status: DC | PRN
  Administered 2018-01-28: 200 mg via INTRAVENOUS

## 2018-01-28 MED ORDER — FUROSEMIDE 20 MG PO TABS
20.0000 mg | ORAL_TABLET | Freq: Every day | ORAL | Status: DC
  Administered 2018-01-28 – 2018-01-29 (×2): 20 mg via ORAL
  Filled 2018-01-28 (×2): qty 1

## 2018-01-28 MED ORDER — SUVOREXANT 10 MG PO TABS: 10.0000 mg | ORAL_TABLET | Freq: Every day | ORAL | Status: DC

## 2018-01-28 MED ORDER — SODIUM CHLORIDE 0.9 % IV SOLN
INTRAVENOUS | Status: DC | PRN
  Administered 2018-01-28: 40 ug/min via INTRAVENOUS

## 2018-01-28 MED ORDER — LIDOCAINE HCL (CARDIAC) PF 100 MG/5ML IV SOSY
PREFILLED_SYRINGE | INTRAVENOUS | Status: DC | PRN
  Administered 2018-01-28: 100 mg via INTRAVENOUS

## 2018-01-28 MED ORDER — ROCURONIUM BROMIDE 100 MG/10ML IV SOLN
INTRAVENOUS | Status: DC | PRN
  Administered 2018-01-28: 20 mg via INTRAVENOUS
  Administered 2018-01-28: 30 mg via INTRAVENOUS
  Administered 2018-01-28: 20 mg via INTRAVENOUS

## 2018-01-28 MED ORDER — DOBUTAMINE IN D5W 4-5 MG/ML-% IV SOLN
INTRAVENOUS | Status: DC | PRN
  Administered 2018-01-28: 20 ug/kg/min via INTRAVENOUS

## 2018-01-28 MED ORDER — THERA M PLUS PO TABS: 1.0000 | ORAL_TABLET | Freq: Every day | ORAL | Status: DC

## 2018-01-28 SURGICAL SUPPLY — 19 items
BAG SNAP BAND KOVER 36X36 (MISCELLANEOUS) ×2 IMPLANT
CATH MAPPNG PENTARAY F 2-6-2MM (CATHETERS) ×1 IMPLANT
CATH SMTCH THERMOCOOL SF DF (CATHETERS) ×2 IMPLANT
CATH SOUNDSTAR 3D IMAGING (CATHETERS) ×2 IMPLANT
CATH WEBSTER BI DIR CS D-F CRV (CATHETERS) ×2 IMPLANT
COVER SWIFTLINK CONNECTOR (BAG) ×2 IMPLANT
PACK EP LATEX FREE (CUSTOM PROCEDURE TRAY) ×1
PACK EP LF (CUSTOM PROCEDURE TRAY) ×1 IMPLANT
PAD DEFIB LIFELINK (PAD) ×2 IMPLANT
PATCH CARTO3 (PAD) ×2 IMPLANT
PENTARAY F 2-6-2MM (CATHETERS) ×2
SHEATH AVANTI 11F 11CM (SHEATH) ×2 IMPLANT
SHEATH BAYLIS SUREFLEX  M 8.5 (SHEATH) ×2
SHEATH BAYLIS SUREFLEX M 8.5 (SHEATH) ×2 IMPLANT
SHEATH BAYLIS TRANSSEPTAL 98CM (NEEDLE) ×2 IMPLANT
SHEATH PINNACLE 7F 10CM (SHEATH) ×2 IMPLANT
SHEATH PINNACLE 8F 10CM (SHEATH) ×4 IMPLANT
SHEATH PINNACLE 9F 10CM (SHEATH) ×4 IMPLANT
TUBING SMART ABLATE COOLFLOW (TUBING) ×2 IMPLANT

## 2018-01-28 NOTE — Discharge Instructions (Signed)
Post procedure care instructions No driving for 4 days. No lifting over 5 lbs for 1 week. No vigorous or sexual activity for 1 week. You may return to work on 02/04/18. Keep procedure site clean & dry. If you notice increased pain, swelling, bleeding or pus, call/return!  You may shower, but no soaking baths/hot tubs/pools for 1 week.    You have an appointment set up with the [facility].  Multiple studies have shown that being followed by a dedicated atrial fibrillation clinic in addition to the standard care you receive from your other physicians improves health. We believe that enrollment in the atrial fibrillation clinic will allow [place] to better care for you.   The phone number to the [facility] is [phone]. The clinic is staffed Monday through Friday from 8:30am to 5pm.  Parking Directions: The clinic is located in the Heart and Vascular Building connected to [facility]. 1)From [address] turn on to [address] and go to the 3rd entrance  (Heart and Vascular entrance) on the right. 2)Look to the right for Heart &Vascular Parking Garage. 3)A code for the entrance is required, for August is 1500   4)Take the elevators to the 1st floor. Registration is in the room with the glass walls at the end of the hallway.  If you have any trouble parking or locating the clinic, please dont hesitate to call [phone].

## 2018-01-28 NOTE — Progress Notes (Addendum)
Site area: Right groin 9 french sheaths X2 were removed  Site Prior to Removal:  Level 0  Pressure Applied For 20  MINUTES    Bedrest Beginning at 1615p  Manual:   Yes.    Patient Status During Pull:  stable  Post Pull Groin Site:  Level 0  Post Pull Instructions Given:  Yes.    Post Pull Pulses Present:  Yes.    Dressing Applied:  Yes.    Comments:  stable

## 2018-01-28 NOTE — Progress Notes (Signed)
Patient c/o sudden  pain on left groin, with large hematoma noted on assessment. Manual pressure done for 10 mins, hematoma  Initially resolved  and hematoma redeveloped after manual  pressure hold.Hematoma  approximately 16x 10 cm in size. Manual pressure reapplied with success. , with slight vagal reaction during pressure hold , b/p 77/41 mmhg HR 77.  NS 100 cc IV  bolus  given,  fentanyl given x 2 doses for pain ( see MAR)  Dr Curt Bears notified and evaluated patient. Left groin site level 1 , bruised but soft , pressure dsg in place. Endorsed to incoming RN ,monitored patient closely.. Vital signs stable.

## 2018-01-28 NOTE — Telephone Encounter (Signed)
Pt is taking Amiodarone 200 mg once daily. Please send Rx to pharmacy

## 2018-01-28 NOTE — Anesthesia Postprocedure Evaluation (Signed)
Anesthesia Post Note  Patient: Wayne Booth  Procedure(s) Performed: ATRIAL FIBRILLATION ABLATION (N/A )     Patient location during evaluation: PACU Anesthesia Type: General Level of consciousness: awake Pain management: pain level controlled Vital Signs Assessment: post-procedure vital signs reviewed and stable Respiratory status: spontaneous breathing Cardiovascular status: stable Postop Assessment: no apparent nausea or vomiting Anesthetic complications: no    Last Vitals:  Vitals:   01/28/18 1425 01/28/18 1430  BP: 125/67 128/64  Pulse: 76 77  Resp: (!) 23 19  Temp:    SpO2: 90% 90%    Last Pain:  Vitals:   01/28/18 1401  TempSrc:   PainSc: 10-Worst pain ever   Pain Goal: Patients Stated Pain Goal: 2 (01/28/18 1401)               Kaeleb Emond JR,JOHN Mateo Flow

## 2018-01-28 NOTE — Anesthesia Preprocedure Evaluation (Signed)
Anesthesia Evaluation  Patient identified by MRN, date of birth, ID band Patient awake    Reviewed: Allergy & Precautions, NPO status , Patient's Chart, lab work & pertinent test results  Airway Mallampati: II       Dental  (+) Upper Dentures, Poor Dentition   Pulmonary former smoker,    Pulmonary exam normal breath sounds clear to auscultation       Cardiovascular hypertension, Pt. on medications and Pt. on home beta blockers  Rhythm:Irregular Rate:Normal     Neuro/Psych    GI/Hepatic   Endo/Other  Hypothyroidism Morbid obesity  Renal/GU   negative genitourinary   Musculoskeletal   Abdominal (+) + obese,   Peds  Hematology   Anesthesia Other Findings Wayne Booth  ECHO COMPLETE WITH IMAGING ENHANCING AGENT  Order# [id number]  Reading physician: Lelon Perla, MD Ordering physician: Rise Patience, MD Study date: 11/02/17 Study Result   Result status: Final result                             *[facility]*                         [address], [place] [address]                            [phone]  ------------------------------------------------------------------- Transthoracic Echocardiography  Patient:    Wayne, Booth MR #:       [id number] Study Date: 11/02/2017 Gender:     M Age:        77 Height:     177.8 cm Weight:     134.7 kg BSA:        2.64 m^2 Pt. Status: Room:       4E12C   ADMITTING    Karmen Bongo  Merrie Roof  PERFORMING   [facility], Inpatient  SONOGRAPHER  Darlina Sicilian, RDCS  cc:  ------------------------------------------------------------------- LV EF: 50% -   55%  ------------------------------------------------------------------- Indications:      Atrial flutter  427.32.  ------------------------------------------------------------------- History:   PMH:  Sleep Apnea.  PMH:  Renal Cell Carcinoma.  Risk factors:  Hypertension.  ------------------------------------------------------------------- Study Conclusions  - Left ventricle: The cavity size was normal. Wall thickness was   increased in a pattern of mild LVH. Systolic function was normal.   The estimated ejection fraction was in the range of 50% to 55%.   Wall motion was normal; there were no regional wall motion   abnormalities. Features are consistent with a pseudonormal left   ventricular filling pattern, with concomitant abnormal relaxation   and increased filling pressure (grade 2 diastolic dysfunction). - Aortic valve: Noncoronary cusp mobility was moderately   restricted. - Mitral valve: Calcified annulus.  Impressions:  - Technically difficult; definity used; normal LV systolic   function; mild LVH; moderate diastolic dysfunction.  ------------------------------------------------------------------- Study data:  Comparison was made to the study of 11/05/2016.  Study status:  Routine.  Procedure:  The patient reported no pain pre or post test. Transthoracic echocardiography. Image quality was poor. The study was  technically difficult, as a result of poor acoustic windows and body habitus. Intravenous contrast (Definity) was administered.  Study completion:  There were no complications.     Transthoracic echocardiography.  M-mode, complete 2D, spectral Doppler, and color Doppler.  Birthdate:  Patient birthdate: 04-Nov-1940.  Age:  Patient is 77 yr old.  Sex:  Gender: male. BMI: 42.6 kg/m^2.  Blood pressure:     109/80  Patient status: Inpatient.  Study date:  Study date: 11/02/2017. Study time: 02:11 PM.  Location:  Bedside.  -------------------------------------------------------------------  ------------------------------------------------------------------- Left  ventricle:  The cavity size was normal. Wall thickness was increased in a pattern of mild LVH. Systolic function was normal. The estimated ejection fraction was in the range of 50% to 55%. Wall motion was normal; there were no regional wall motion abnormalities. Features are consistent with a pseudonormal left ventricular filling pattern, with concomitant abnormal relaxation and increased filling pressure (grade 2 diastolic dysfunction).  ------------------------------------------------------------------- Aortic valve:   Trileaflet; moderately calcified leaflets. Noncoronary cusp mobility was moderately restricted.  Doppler: Transvalvular velocity was within the normal range. There was no stenosis. There was no regurgitation.  ------------------------------------------------------------------- Aorta:  Aortic root: The aortic root was normal in size.  ------------------------------------------------------------------- Mitral valve:   Calcified annulus. Mobility was not restricted. Doppler:  Transvalvular velocity was within the normal range. There was no evidence for stenosis. There was no regurgitation.    Peak gradient (D): 5 mm Hg.  ------------------------------------------------------------------- Left atrium:  The atrium was normal in size.  ------------------------------------------------------------------- Right ventricle:  Poorly visualized. The cavity size was normal. Systolic function was normal.  ------------------------------------------------------------------- Pulmonic valve:    Doppler:  Transvalvular velocity was within the normal range. There was no evidence for stenosis.  ------------------------------------------------------------------- Tricuspid valve:  Poorly visualized.  Structurally normal valve. Doppler:  Transvalvular velocity was within the normal range. There was trivial  regurgitation.  ------------------------------------------------------------------- Right atrium:  Poorly visualized. The atrium was normal in size.   ------------------------------------------------------------------- Pericardium:  There was no pericardial effusion.  ------------------------------------------------------------------- Systemic veins: Inferior vena cava: The vessel was mildly dilated.  ------------------------------------------------------------------- Measurements   Left ventricle                         Value        Reference  LV ID, ED, PLAX chordal                47.1  mm     43 - 52  LV ID, ES, PLAX chordal        (H)     39.5  mm     23 - 38  LV fx shortening, PLAX chordal (L)     16    %      >=29  LV PW thickness, ED                    11.6  mm     ---------  IVS/LV PW ratio, ED                    1.14         <=1.3  Stroke volume, 2D                      52    ml     ---------  Stroke volume/bsa, 2D                  20  ml/m^2 ---------  LV e&', lateral                         7.51  cm/s   ---------  LV E/e&', lateral                       14.65        ---------  LV e&', medial                          6.09  cm/s   ---------  LV E/e&', medial                        18.06        ---------  LV e&', average                         6.8   cm/s   ---------  LV E/e&', average                       16.18        ---------    Ventricular septum                     Value        Reference  IVS thickness, ED                      13.2  mm     ---------    LVOT                                   Value        Reference  LVOT ID, S                             21    mm     ---------  LVOT area                              3.46  cm^2   ---------  LVOT peak velocity, S                  88.7  cm/s   ---------  LVOT mean velocity, S                  62.5  cm/s   ---------  LVOT VTI, S                            14.9  cm     ---------    Aorta                                   Value        Reference  Aortic root ID, ED                     30    mm     ---------  Ascending aorta ID, A-P, S  30    mm     ---------    Left atrium                            Value        Reference  LA ID, A-P, ES                         35    mm     ---------  LA ID/bsa, A-P                         1.32  cm/m^2 <=2.2  LA volume, ES, 1-p A4C                 54.2  ml     ---------  LA volume/bsa, ES, 1-p A4C             20.5  ml/m^2 ---------  LA volume, ES, 1-p A2C                 37.3  ml     ---------  LA volume/bsa, ES, 1-p A2C             14.1  ml/m^2 ---------    Mitral valve                           Value        Reference  Mitral E-wave peak velocity            110   cm/s   ---------  Mitral A-wave peak velocity            92.6  cm/s   ---------  Mitral deceleration time               183   ms     150 - 230  Mitral peak gradient, D                5     mm Hg  ---------  Mitral E/A ratio, peak                 1.2          ---------    Right ventricle                        Value        Reference  TAPSE                                  21.8  mm     ---------  RV s&', lateral, S                      10.8  cm/s   ---------  Legend: (L)  and  (H)  mark values outside specified reference range.  ------------------------------------------------------------------- Prepared and Electronically Authenticated by  Kirk Ruths 2019-05-07T15:21:40 MERGE Images   Show images for ECHOCARDIOGRAM COMPLETE Patient Information   Patient Name Wayne Booth, Wayne Booth Sex Male DOB 05-07-1941 SSN [id number] Reason for Exam  Priority: Routine  Not on file Surgical History   Surgical History    Procedure Laterality Date Comment Source CARDIAC CATHETERIZATION  2008 clean Provider  Other Surgical History    Procedure  Laterality Date Comment Source ANTERIOR CERVICAL DECOMP/DISCECTOMY FUSION N/A 08/29/2012 Procedure: ANTERIOR CERVICAL DECOMPRESSION/DISCECTOMY  FUSION 1 LEVEL; Surgeon: Eustace Moore, MD; Location: [place] [facility]; Service: Neurosurgery; Laterality: N/A; Provider APPENDECTOMY    Provider CHOLECYSTECTOMY    Provider COLONOSCOPY W/ POLYPECTOMY    Provider CRANIOTOMY N/A 11/08/2012 Procedure: CRANIOTOMY HYPOPHYSECTOMY TRANSNASAL APPROACH; Surgeon: Faythe Ghee, MD; Location: [place] [facility]; Service: Neurosurgery; Laterality: N/A; Transphenoidal Resection of Pituitary Tumor Provider FINGER SURGERY Left 2017 Dr Amedeo Plenty Provider [facility]    Provider KIDNEY STONE SURGERY    Provider KNEE ARTHROSCOPY  2007 left Provider NEPHRECTOMY Left   Provider POSTERIOR CERVICAL LAMINECTOMY Left 04/24/2015 Procedure: Foraminotomy cervical five - cervical six cervical six - cervical seven left; Surgeon: Eustace Moore, MD; Location: [facility]; Service: Neurosurgery; Laterality: Left; Provider REPLACEMENT TOTAL KNEE BILATERAL    Provider  Patient Data   Height  71 in  BP  109/80 mmHg    Performing Technologist/Nurse   Performing Technologist/Nurse: Hassie Bruce          Implants    No active implants to display in this view. Order-Level Documents:   There are no order-level documents.  Encounter-Level Documents - 11/01/2017:   Document on 4/98/2641 5:83 PM by Geanie Berlin, [place], [place]: ED PB Billing Extract  Scan on 11/04/2017 4:17 PM by Victory Dakin on 11/04/2017 4:17 PM by Lindwood Qua  Scan on 11/04/2017 11:31 AM by Default, Provider, MDScan on 11/04/2017 11:31 AM by Default, Provider, MD  Document on 11/03/2017 6:16 PM by Alvester Morin Lydia Guiles, RN: IP After Visit Summary  Scan on 11/02/2017 9:17 AM by Default, Provider, MDScan on 11/02/2017 9:17 AM by Default, Provider, MD  Scan on 11/03/2017 11:53 AM by Default, Provider, MDScan on 11/03/2017 11:53 AM by Default, Provider, MD  Electronic signature on 11/01/2017 4:56 PM - Signed  Scan on 11/01/2017 3:31 PM by Merrily Pew, [place] on 11/01/2017  3:31 PM by Merrily Pew, MD  Electronic signature on 11/01/2017 2:20 PM - Signed    Signed   Electronically signed by Lelon Perla, MD on 11/02/17 at 1521 EDT Printable Result Report    Result Report  External Result Report    External Result Report     Reproductive/Obstetrics                             Anesthesia Physical Anesthesia Plan  ASA: III  Anesthesia Plan: General   Post-op Pain Management:    Induction: Intravenous  PONV Risk Score and Plan: 2 and Ondansetron  Airway Management Planned: Oral ETT and Video Laryngoscope Planned  Additional Equipment:   Intra-op Plan:   Post-operative Plan: Extubation in OR  Informed Consent: I have reviewed the patients History and Physical, chart, labs and discussed the procedure including the risks, benefits and alternatives for the proposed anesthesia with the patient or authorized representative who has indicated his/her understanding and acceptance.   Dental advisory given  Plan Discussed with: CRNA and Surgeon  Anesthesia Plan Comments:         Anesthesia Quick Evaluation

## 2018-01-28 NOTE — H&P (Signed)
Electrophysiology Office Note   Date:  01/28/2018   ID:  Wayne Booth, Wayne Booth [birth year], MRN [id number]  PCP:  Marin Olp, MD  Cardiologist:  Irish Lack Primary Electrophysiologist:  Constance Haw, MD    No chief complaint on file.    History of Present Illness: PACE LAMADRID is a 77 y.o. male who is being seen today for the evaluation of atrial flutter at the request of Casandra Doffing. Presenting today for electrophysiology evaluation.  He has a history of obesity, hypertension, and OSA.  He had a left heart catheterization 09/2016 which was normal.  He had hand surgery a few weeks later and was ultimately diagnosed with a PE and put on Xarelto.  He was diagnosed with atrial flutter 10/2017.  He was hospitalized and converted to sinus rhythm and was discharged on Eliquis.  He was readmitted June with atrial fibrillation and rapid rates.  Today, denies symptoms of palpitations, chest pain, shortness of breath, orthopnea, PND, lower extremity edema, claudication, dizziness, presyncope, syncope, bleeding, or neurologic sequela. The patient is tolerating medications without difficulties.     Past Medical History:  Diagnosis Date  . Allergy    States his nose runs when he is around grease.   Marland Kitchen Anxiety   . Arthritis   . Atrial fibrillation ([place])   . Cancer ([place])   . Coronary artery disease   . History of total knee replacement   . Hypertension   . Hypothyroidism   . Kidney cancer, primary, with metastasis from kidney to other site [facility])   . Kidney stone   . OSA (obstructive sleep apnea)    pt does not wear cpap at night  . Pituitary cyst ([place])   . Pneumonia    hx of  . Prostate cancer [facility])    Past Surgical History:  Procedure Laterality Date  . ANTERIOR CERVICAL DECOMP/DISCECTOMY FUSION N/A 08/29/2012   Procedure: ANTERIOR CERVICAL DECOMPRESSION/DISCECTOMY FUSION 1 LEVEL;  Surgeon: Eustace Moore, MD;  Location: [place] [facility];  Service: Neurosurgery;  Laterality: N/A;    . APPENDECTOMY    . CARDIAC CATHETERIZATION  2008   clean  . CHOLECYSTECTOMY    . COLONOSCOPY W/ POLYPECTOMY    . CRANIOTOMY N/A 11/08/2012   Procedure: CRANIOTOMY HYPOPHYSECTOMY TRANSNASAL APPROACH;  Surgeon: Faythe Ghee, MD;  Location: [place] [facility];  Service: Neurosurgery;  Laterality: N/A;  Transphenoidal Resection of Pituitary Tumor  . FINGER SURGERY Left 2017   Dr Amedeo Plenty  . INSERTION PROSTATE RADIATION SEED    . KIDNEY STONE SURGERY    . KNEE ARTHROSCOPY  2007   left  . NEPHRECTOMY Left   . POSTERIOR CERVICAL LAMINECTOMY Left 04/24/2015   Procedure: Foraminotomy cervical five - cervical six cervical six - cervical seven left;  Surgeon: Eustace Moore, MD;  Location: [facility];  Service: Neurosurgery;  Laterality: Left;  . REPLACEMENT TOTAL KNEE BILATERAL       Current Facility-Administered Medications  Medication Dose Route Frequency Provider Last Rate Last Dose  . 0.9 %  sodium chloride infusion   Intravenous Continuous Apollos Tenbrink, Ocie Doyne, MD        Allergies:   Patient has no known allergies.   Social History:  The patient  reports that he has quit smoking. His smoking use included cigars. He quit after 4.00 years of use. He has never used smokeless tobacco. He reports that he drinks about 1.2 oz of alcohol per week. He reports that he does not  use drugs.   Family History:  The patient's family history includes Cancer in his father and mother; Colon cancer (age of onset: 38) in his brother; Kidney cancer in his sister.    ROS:  Please see the history of present illness.   Otherwise, review of systems is positive for none.   All other systems are reviewed and negative.   PHYSICAL EXAM: VS:  BP (!) 162/78   Pulse 73   Temp 98.7 F (37.1 C) (Oral)   Resp 18   Ht 5\' 11"  (1.803 m)   Wt 283 lb (128.4 kg)   SpO2 94%   BMI 39.47 kg/m  , BMI Body mass index is 39.47 kg/m. GEN: Well nourished, well developed, in no acute distress  HEENT: normal  Neck: no JVD,  carotid bruits, or masses Cardiac: RRR; no murmurs, rubs, or gallops,no edema  Respiratory:  clear to auscultation bilaterally, normal work of breathing GI: soft, nontender, nondistended, + BS MS: no deformity or atrophy  Skin: warm and dry Neuro:  Strength and sensation are intact Psych: euthymic mood, full affect  Recent Labs: 11/01/2017: ALT 34; Magnesium 1.9 12/03/2017: B Natriuretic Peptide 271.9 12/06/2017: TSH 0.569 01/21/2018: BUN 15; Creatinine, Ser 1.29; Hemoglobin 16.0; Platelets 341; Potassium 4.7; Sodium 136    Lipid Panel     Component Value Date/Time   CHOL 141 04/12/2017 0833   TRIG 168.0 (H) 04/12/2017 0833   HDL 38.20 (L) 04/12/2017 0833   CHOLHDL 4 04/12/2017 0833   VLDL 33.6 04/12/2017 0833   LDLCALC 69 04/12/2017 0833   LDLDIRECT 62.0 09/07/2006 0920     Wt Readings from Last 3 Encounters:  01/28/18 283 lb (128.4 kg)  01/21/18 292 lb 6.4 oz (132.6 kg)  01/18/18 293 lb 3.2 oz (133 kg)      Other studies Reviewed: Additional studies/ records that were reviewed today include: TTE 11/02/17  Review of the above records today demonstrates:  - Left ventricle: The cavity size was normal. Wall thickness was   increased in a pattern of mild LVH. Systolic function was normal.   The estimated ejection fraction was in the range of 50% to 55%.   Wall motion was normal; there were no regional wall motion   abnormalities. Features are consistent with a pseudonormal left   ventricular filling pattern, with concomitant abnormal relaxation   and increased filling pressure (grade 2 diastolic dysfunction). - Aortic valve: Noncoronary cusp mobility was moderately   restricted. - Mitral valve: Calcified annulus.  Myoview 12/04/17 1. Decreased uptake along the proximal septum during both rest and stress images suggesting remote infarct. 2. Small area of reversible ischemia in the apex. 3. Mild hypokinesia along the septum. Wall motion and thickening is otherwise normal. 4.  Left ventricular ejection fraction is 51%. 5. Noninvasive risk stratification*: Intermediate  ASSESSMENT AND PLAN:  1.  Typical atrial flutter/atrial fibrillation:   THAI BURGUENO has presented today for surgery, with the diagnosis of atrial fibrillation.  The various methods of treatment have been discussed with the patient and family. After consideration of risks, benefits and other options for treatment, the patient has consented to  Procedure(s): Catheter ablation as a surgical intervention .  Risks include but not limited to bleeding, tamponade, heart block, stroke, damage to surrounding organs, among others. The patient's history has been reviewed, patient examined, no change in status, stable for surgery.  I have reviewed the patient's chart and labs.  Questions were answered to the patient's satisfaction.  Signed, Shefali Ng Meredith Leeds, MD  01/28/2018 10:23 AM

## 2018-01-28 NOTE — Anesthesia Procedure Notes (Signed)
Procedure Name: Intubation Date/Time: 01/28/2018 11:06 AM Performed by: Raenette Rover, CRNA Pre-anesthesia Checklist: Patient identified, Emergency Drugs available, Suction available and Patient being monitored Patient Re-evaluated:Patient Re-evaluated prior to induction Oxygen Delivery Method: Circle system utilized Preoxygenation: Pre-oxygenation with 100% oxygen Induction Type: IV induction Ventilation: Mask ventilation without difficulty and Oral airway inserted - appropriate to patient size Laryngoscope Size: Sabra Heck and 3 Grade View: Grade I Tube type: Oral Tube size: 7.5 mm Number of attempts: 1 Airway Equipment and Method: Stylet Placement Confirmation: ETT inserted through vocal cords under direct vision,  positive ETCO2,  CO2 detector and breath sounds checked- equal and bilateral Secured at: 23 cm Tube secured with: Tape Dental Injury: Teeth and Oropharynx as per pre-operative assessment

## 2018-01-28 NOTE — Transfer of Care (Signed)
Immediate Anesthesia Transfer of Care Note  Patient: Wayne Booth  Procedure(s) Performed: ATRIAL FIBRILLATION ABLATION (N/A )  Patient Location: PACU  Anesthesia Type:General  Level of Consciousness: awake, alert , oriented and patient cooperative  Airway & Oxygen Therapy: Patient Spontanous Breathing and Patient connected to face mask oxygen  Post-op Assessment: Report given to RN and Post -op Vital signs reviewed and stable  Post vital signs: Reviewed and stable  Last Vitals:  Vitals Value Taken Time  BP 124/69 01/28/2018  2:15 PM  Temp    Pulse 79 01/28/2018  2:16 PM  Resp 24 01/28/2018  2:16 PM  SpO2 89 % 01/28/2018  2:16 PM  Vitals shown include unvalidated device data.  Last Pain:  Vitals:   01/28/18 0946  TempSrc: Oral         Complications: No apparent anesthesia complications

## 2018-01-28 NOTE — Progress Notes (Addendum)
Site area: Left groin a 7 and 11 french venous sheath was removed  Site Prior to Removal:  Level 0  Pressure Applied For 20 MINUTES    Bedrest Beginning at 1615p  Manual:   Yes.    Patient Status During Pull:  stable  Post Pull Groin Site:  Level 0  Post Pull Instructions Given:  Yes.    Post Pull Pulses Present:  Yes.    Dressing Applied:  Yes.    Comments:  VS remain stable

## 2018-01-29 DIAGNOSIS — M1990 Unspecified osteoarthritis, unspecified site: Secondary | ICD-10-CM | POA: Diagnosis not present

## 2018-01-29 DIAGNOSIS — Z800 Family history of malignant neoplasm of digestive organs: Secondary | ICD-10-CM | POA: Diagnosis not present

## 2018-01-29 DIAGNOSIS — I480 Paroxysmal atrial fibrillation: Secondary | ICD-10-CM

## 2018-01-29 DIAGNOSIS — Z905 Acquired absence of kidney: Secondary | ICD-10-CM | POA: Diagnosis not present

## 2018-01-29 DIAGNOSIS — I10 Essential (primary) hypertension: Secondary | ICD-10-CM | POA: Diagnosis not present

## 2018-01-29 DIAGNOSIS — Z9049 Acquired absence of other specified parts of digestive tract: Secondary | ICD-10-CM | POA: Diagnosis not present

## 2018-01-29 DIAGNOSIS — Z8051 Family history of malignant neoplasm of kidney: Secondary | ICD-10-CM | POA: Diagnosis not present

## 2018-01-29 DIAGNOSIS — E039 Hypothyroidism, unspecified: Secondary | ICD-10-CM | POA: Diagnosis not present

## 2018-01-29 DIAGNOSIS — Z85528 Personal history of other malignant neoplasm of kidney: Secondary | ICD-10-CM | POA: Diagnosis not present

## 2018-01-29 DIAGNOSIS — G4733 Obstructive sleep apnea (adult) (pediatric): Secondary | ICD-10-CM | POA: Diagnosis not present

## 2018-01-29 DIAGNOSIS — I483 Typical atrial flutter: Secondary | ICD-10-CM | POA: Diagnosis not present

## 2018-01-29 DIAGNOSIS — Z809 Family history of malignant neoplasm, unspecified: Secondary | ICD-10-CM | POA: Diagnosis not present

## 2018-01-29 DIAGNOSIS — Z79899 Other long term (current) drug therapy: Secondary | ICD-10-CM | POA: Diagnosis not present

## 2018-01-29 DIAGNOSIS — Z981 Arthrodesis status: Secondary | ICD-10-CM | POA: Diagnosis not present

## 2018-01-29 DIAGNOSIS — Z96652 Presence of left artificial knee joint: Secondary | ICD-10-CM | POA: Diagnosis not present

## 2018-01-29 DIAGNOSIS — Z87891 Personal history of nicotine dependence: Secondary | ICD-10-CM | POA: Diagnosis not present

## 2018-01-29 DIAGNOSIS — Z9889 Other specified postprocedural states: Secondary | ICD-10-CM | POA: Diagnosis not present

## 2018-01-29 DIAGNOSIS — Z87442 Personal history of urinary calculi: Secondary | ICD-10-CM | POA: Diagnosis not present

## 2018-01-29 NOTE — Progress Notes (Signed)
Left groin, level 1, bruise noted, palpated with no hematoma and no bleeding. Dressing on dry and intact. Right groin level 0. Patient ambulated in hall with NT, slow steady gait. Stated he felt tired, a little shortness of breath but better than it has been. No other complaints with ambulation. Left groin reevaluated and remains soft with palpation.

## 2018-01-29 NOTE — Discharge Summary (Signed)
ELECTROPHYSIOLOGY PROCEDURE DISCHARGE SUMMARY    Patient ID: Wayne Booth,  MRN: [id number], DOB/AGE: Dec 06, 1940 77 y.o.  Admit date: 01/28/2018 Discharge date: 01/29/2018  PCP:  Marin Olp, MD            Cardiologist:  Irish Lack Primary Electrophysiologist:  Will Meredith Leeds, MD      Primary Discharge Diagnosis:  1. Paroxysmal atrial fibrillation 2. Typical atrial flutter  Secondary Discharge Diagnosis:  1. Morbid obesity 2. L leg hematoma 3. HTN  Procedures This Admission:  1.  Electrophysiology study and radiofrequency catheter ablation on 01/28/2018 by Dr  Curt Bears.  This study demonstrated successful electrical isolation of all four PVs as well as CTI ablation.    Brief HPI: Wayne Booth is a 77 y.o. male with a history of paroxysmal atrial fibrillation and atrial flutter.  Risks, benefits, and alternatives to catheter ablation of atrial fibrillation were reviewed with the patient who wished to proceed.      Hospital Course:  The patient was admitted and underwent EPS/RFCA of atrial fibrillation with details as outlined above.  They were monitored on telemetry overnight which demonstrated sinus rhythm.  R Groin was without complication on the day of discharge.  He did have a large left groin hematoma.  The patient was examined and considered to be stable for discharge.  Wound care and restrictions were reviewed with the patient. He will be called on Monday by AF clinic for further evaluation of his groin. The patient will be seen back by Roderic Palau, NP in 4 weeks and Dr Rayann Heman in 12 weeks for post ablation follow up.  He was also advised to discuss the [facility] AF program as he lives right across the street from [facility].  This patients CHA2DS2-VASc Score and unadjusted Ischemic Stroke Rate (% per year) is equal to 4.8 % stroke rate/year from a score of 4 Above score calculated as 1 point each if present [CHF, HTN, DM, Vascular=MI/PAD/Aortic Plaque, Age if 6-74,  or Male] Above score calculated as 2 points each if present [Age > 75, or Stroke/TIA/TE]   Physical Exam: Vitals:   01/28/18 1930 01/28/18 2000 01/29/18 0400 01/29/18 0715  BP: (!) 112/41 126/60 126/60 106/64  Pulse: 79 79 78 81  Resp: 18 (!) 21 19 (!) 22  Temp: 97.9 F (36.6 C)  (!) 97.5 F (36.4 C) 97.7 F (36.5 C)  TempSrc: Oral  Oral Oral  SpO2: 95% 92% 92% 96%  Weight:   293 lb 12.8 oz (133.3 kg)   Height:        GEN- The patient is morbidly obeseappearing, alert and oriented x 3 today.    Walks very slowly, appears very deconditioned HEENT: normocephalic, atraumatic; sclera clear, conjunctiva pink; hearing intact; oropharynx clear; neck supple  Lungs- Clear to ausculation bilaterally, normal work of breathing.  No wheezes, rales, rhonchi Heart- Regular rate and rhythm, no murmurs, rubs or gallops  GI- soft, non-tender, non-distended, bowel sounds present  Extremities- no clubbing, cyanosis, or edema; DP/PT/radial pulses 2+ bilaterally, R groin without hematoma/bruit.  L groin has a large but soft hematoma, no active bleeding, no bruit MS- no significant deformity or atrophy Skin- warm and dry, no rash or lesion Psych- euthymic mood, full affect Neuro- strength and sensation are intact   Labs:   Lab Results  Component Value Date   WBC 9.6 01/21/2018   HGB 16.0 01/21/2018   HCT 46.7 01/21/2018   MCV 91 01/21/2018   PLT 341  01/21/2018   No results for input(s): NA, K, CL, CO2, BUN, CREATININE, CALCIUM, PROT, BILITOT, ALKPHOS, ALT, AST, GLUCOSE in the last 168 hours.  Invalid input(s): LABALBU   Discharge Medications:  Allergies as of 01/29/2018   No Known Allergies     Medication List    TAKE these medications   albuterol 108 (90 Base) MCG/ACT inhaler Commonly known as:  PROVENTIL HFA;VENTOLIN HFA Inhale 2 puffs into the lungs every 6 (six) hours as needed for wheezing or shortness of breath.   allopurinol 300 MG tablet Commonly known as:  ZYLOPRIM Take  0.5 tablets (150 mg total) by mouth daily.   amiodarone 200 MG tablet Commonly known as:  PACERONE Take 1 tablet (200 mg total) by mouth daily. What changed:    how much to take  how to take this  when to take this  additional instructions   apixaban 5 MG Tabs tablet Commonly known as:  ELIQUIS Take 1 tablet (5 mg total) by mouth 2 (two) times daily.   budesonide-formoterol 160-4.5 MCG/ACT inhaler Commonly known as:  SYMBICORT Inhale 2 puffs into the lungs 2 (two) times daily as needed (for respiratory flares).   clotrimazole-betamethasone cream Commonly known as:  LOTRISONE Apply 1 application topically 2 (two) times daily. Use for 10 days maximum. What changed:    when to take this  reasons to take this  additional instructions   colchicine 0.6 MG tablet Take 1 tablet (0.6 mg total) by mouth daily.   cyclobenzaprine 10 MG tablet Commonly known as:  FLEXERIL Take 10 mg by mouth daily as needed for muscle spasms.   diltiazem 240 MG 24 hr capsule Commonly known as:  CARDIZEM CD Take 1 capsule (240 mg total) by mouth daily.   escitalopram 20 MG tablet Commonly known as:  LEXAPRO TAKE ONE TABLET BY MOUTH DAILY   furosemide 20 MG tablet Commonly known as:  LASIX Take 1 tablet (20 mg total) by mouth daily.   levothyroxine 150 MCG tablet Commonly known as:  SYNTHROID, LEVOTHROID TAKE ONE TABLET BY MOUTH DAILY   metoprolol tartrate 25 MG tablet Commonly known as:  LOPRESSOR Take 0.5 tablets (12.5 mg total) by mouth 2 (two) times daily.   multivitamins ther. w/minerals Tabs tablet Take 1 tablet by mouth daily.   nitroGLYCERIN 0.4 MG SL tablet Commonly known as:  NITROSTAT Place 1 tablet (0.4 mg total) under the tongue every 5 (five) minutes as needed for chest pain.   nortriptyline 75 MG capsule Commonly known as:  PAMELOR TAKE ONE CAPSULE BY MOUTH AT BEDTIME   Suvorexant 10 MG Tabs Commonly known as:  BELSOMRA Take 10 mg by mouth at bedtime.        Disposition:   Follow-up Information    [facility] Follow up on 03/01/2018.   Specialty:  Cardiology Why:  11:00AM Contact information: [address] [id number] [facility] [address] [place] [address] [phone]       Constance Haw, MD Follow up on 05/03/2018.   Specialty:  Cardiology Why:  11:15PM Contact information: [place] [address] [phone]           Duration of Discharge Encounter: Greater than 30 minutes including physician time.  Army Fossa MD 01/29/2018 9:59 AM

## 2018-01-31 ENCOUNTER — Ambulatory Visit (HOSPITAL_BASED_OUTPATIENT_CLINIC_OR_DEPARTMENT_OTHER)
Admission: RE | Admit: 2018-01-31 | Discharge: 2018-01-31 | Disposition: A | Discharge status: Home / Self Care | Payer: Medicare Other | Source: Ambulatory Visit | Attending: Nurse Practitioner | Admitting: Nurse Practitioner

## 2018-01-31 ENCOUNTER — Telehealth: Payer: Self-pay | Admitting: Interventional Cardiology

## 2018-01-31 ENCOUNTER — Ambulatory Visit (HOSPITAL_COMMUNITY)
Admission: RE | Admit: 2018-01-31 | Discharge: 2018-01-31 | Disposition: A | Discharge status: Home / Self Care | Payer: Medicare Other | Source: Ambulatory Visit | Attending: Nurse Practitioner | Admitting: Nurse Practitioner

## 2018-01-31 ENCOUNTER — Encounter (HOSPITAL_COMMUNITY): Payer: Self-pay | Admitting: Cardiology

## 2018-01-31 DIAGNOSIS — X58XXXA Exposure to other specified factors, initial encounter: Secondary | ICD-10-CM | POA: Insufficient documentation

## 2018-01-31 DIAGNOSIS — G4733 Obstructive sleep apnea (adult) (pediatric): Secondary | ICD-10-CM | POA: Insufficient documentation

## 2018-01-31 DIAGNOSIS — Z800 Family history of malignant neoplasm of digestive organs: Secondary | ICD-10-CM | POA: Insufficient documentation

## 2018-01-31 DIAGNOSIS — S301XXA Contusion of abdominal wall, initial encounter: Secondary | ICD-10-CM | POA: Insufficient documentation

## 2018-01-31 DIAGNOSIS — I480 Paroxysmal atrial fibrillation: Secondary | ICD-10-CM

## 2018-01-31 DIAGNOSIS — J440 Chronic obstructive pulmonary disease with acute lower respiratory infection: Secondary | ICD-10-CM | POA: Diagnosis not present

## 2018-01-31 DIAGNOSIS — Z79890 Hormone replacement therapy (postmenopausal): Secondary | ICD-10-CM | POA: Insufficient documentation

## 2018-01-31 DIAGNOSIS — F419 Anxiety disorder, unspecified: Secondary | ICD-10-CM

## 2018-01-31 DIAGNOSIS — E871 Hypo-osmolality and hyponatremia: Secondary | ICD-10-CM | POA: Diagnosis not present

## 2018-01-31 DIAGNOSIS — Z85528 Personal history of other malignant neoplasm of kidney: Secondary | ICD-10-CM | POA: Insufficient documentation

## 2018-01-31 DIAGNOSIS — Z8546 Personal history of malignant neoplasm of prostate: Secondary | ICD-10-CM

## 2018-01-31 DIAGNOSIS — E039 Hypothyroidism, unspecified: Secondary | ICD-10-CM

## 2018-01-31 DIAGNOSIS — Z86711 Personal history of pulmonary embolism: Secondary | ICD-10-CM | POA: Diagnosis not present

## 2018-01-31 DIAGNOSIS — R0602 Shortness of breath: Secondary | ICD-10-CM | POA: Diagnosis not present

## 2018-01-31 DIAGNOSIS — I5033 Acute on chronic diastolic (congestive) heart failure: Secondary | ICD-10-CM | POA: Diagnosis not present

## 2018-01-31 DIAGNOSIS — J168 Pneumonia due to other specified infectious organisms: Secondary | ICD-10-CM | POA: Diagnosis not present

## 2018-01-31 DIAGNOSIS — I130 Hypertensive heart and chronic kidney disease with heart failure and stage 1 through stage 4 chronic kidney disease, or unspecified chronic kidney disease: Secondary | ICD-10-CM | POA: Diagnosis not present

## 2018-01-31 DIAGNOSIS — A4102 Sepsis due to Methicillin resistant Staphylococcus aureus: Secondary | ICD-10-CM | POA: Diagnosis not present

## 2018-01-31 DIAGNOSIS — J9601 Acute respiratory failure with hypoxia: Secondary | ICD-10-CM | POA: Diagnosis not present

## 2018-01-31 DIAGNOSIS — E873 Alkalosis: Secondary | ICD-10-CM | POA: Diagnosis not present

## 2018-01-31 DIAGNOSIS — R079 Chest pain, unspecified: Secondary | ICD-10-CM | POA: Diagnosis not present

## 2018-01-31 DIAGNOSIS — Z79899 Other long term (current) drug therapy: Secondary | ICD-10-CM | POA: Insufficient documentation

## 2018-01-31 DIAGNOSIS — I2510 Atherosclerotic heart disease of native coronary artery without angina pectoris: Secondary | ICD-10-CM | POA: Insufficient documentation

## 2018-01-31 DIAGNOSIS — R600 Localized edema: Secondary | ICD-10-CM

## 2018-01-31 DIAGNOSIS — I10 Essential (primary) hypertension: Secondary | ICD-10-CM | POA: Insufficient documentation

## 2018-01-31 DIAGNOSIS — I517 Cardiomegaly: Secondary | ICD-10-CM

## 2018-01-31 DIAGNOSIS — I4892 Unspecified atrial flutter: Secondary | ICD-10-CM | POA: Diagnosis not present

## 2018-01-31 DIAGNOSIS — Z87891 Personal history of nicotine dependence: Secondary | ICD-10-CM

## 2018-01-31 DIAGNOSIS — G9341 Metabolic encephalopathy: Secondary | ICD-10-CM | POA: Diagnosis not present

## 2018-01-31 DIAGNOSIS — Z96653 Presence of artificial knee joint, bilateral: Secondary | ICD-10-CM | POA: Insufficient documentation

## 2018-01-31 DIAGNOSIS — Z7901 Long term (current) use of anticoagulants: Secondary | ICD-10-CM

## 2018-01-31 DIAGNOSIS — J15212 Pneumonia due to Methicillin resistant Staphylococcus aureus: Secondary | ICD-10-CM | POA: Diagnosis not present

## 2018-01-31 DIAGNOSIS — Z905 Acquired absence of kidney: Secondary | ICD-10-CM | POA: Diagnosis not present

## 2018-01-31 DIAGNOSIS — I313 Pericardial effusion (noninflammatory): Secondary | ICD-10-CM | POA: Diagnosis not present

## 2018-01-31 DIAGNOSIS — N179 Acute kidney failure, unspecified: Secondary | ICD-10-CM | POA: Diagnosis not present

## 2018-01-31 DIAGNOSIS — R6521 Severe sepsis with septic shock: Secondary | ICD-10-CM | POA: Diagnosis not present

## 2018-01-31 NOTE — Telephone Encounter (Signed)
Pt c/o Shortness Of Breath: STAT if SOB developed within the last 24 hours or pt is noticeably SOB on the phone  1. Are you currently SOB (can you hear that pt is SOB on the phone)? Yes-when he walk around  2. How long have you been experiencing SOB? Saturday it became worse after his Ablation on Friday  3. Are you SOB when sitting or when up moving around? Only moving arounda  4. Are you currently experiencing any other symptoms? no

## 2018-01-31 NOTE — Patient Instructions (Signed)
Increase your lasix for the next 3 days Take 40 mg Tues, Wed and Thurs. Of this week  Take extra 20 mg today  Limit fluids to no more than 2L a day  Limit sodium in your diet.

## 2018-01-31 NOTE — Telephone Encounter (Signed)
Pt reports worsening SOB since ablation this past Friday.   States that he cannot even walk to the kitchen w/o moderate to severe SOB occurring.  He has also experienced some "chest tightness" with some occurrences. Pt reports gaining 3 pounds over weekend. Pt also reports hematoma "getting better, is softer". Pt scheduled to be evaluated by AFib clinic this afternoon.  Pt is agreeable to plan.   Will forward to AFib clinic RN.  Pt aware their office may call him back and assess/address issues via telephone vs. having him come in to office.

## 2018-01-31 NOTE — Progress Notes (Signed)
 Primary Care Physician: Hunter, Stephen O, MD Referring Physician:Dr. Camnitz   Wayne Booth is a 77 y.o. male with a h/o morbid obesity, CAD,Diastolic HF, that is in the afib clinic for evaluation of his shortness of breath since ablation 8/2. His weight today is up 7 lbs. He has been forcing more water at home. PO in the office is 93%. He had hematoma L left groin at dicharge, it is soft, old bruising and not causing the pt any pain.No swallowing issues. He is in SR today at 87 bpm.  Today, he denies symptoms of palpitations, chest pain, shortness of breath, orthopnea, PND, + for 2+ pitting edema  bilaterally  dizziness, presyncope, syncope, or neurologic sequela.+ shortness of breath with exertion. The patient is tolerating medications without difficulties and is otherwise without complaint today.   Past Medical History:  Diagnosis Date  . Allergy    States his nose runs when he is around grease.   . Anxiety   . Arthritis   . Atrial fibrillation (HCC)   . Cancer (HCC)   . Coronary artery disease   . History of total knee replacement   . Hypertension   . Hypothyroidism   . 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 Kidney stone   . OSA (obstructive sleep apnea)    pt does not wear cpap at night  . Pituitary cyst (HCC)   . Pneumonia    hx of  . Prostate cancer (HCC)    Past Surgical History:  Procedure Laterality Date  . ABLATION OF DYSRHYTHMIC FOCUS  01/28/2018  . ANTERIOR CERVICAL DECOMP/DISCECTOMY FUSION N/A 08/29/2012   Procedure: ANTERIOR CERVICAL DECOMPRESSION/DISCECTOMY FUSION 1 LEVEL;  Surgeon: David S Jones, MD;  Location: [facility];  Service: Neurosurgery;  Laterality: N/A;  . APPENDECTOMY    . ATRIAL FIBRILLATION ABLATION N/A 01/28/2018   Procedure: ATRIAL FIBRILLATION ABLATION;  Surgeon: Camnitz, Will Martin, MD;  Location: [facility];  Service: Cardiovascular;  Laterality: N/A;  . CARDIAC CATHETERIZATION  2008   clean    . CHOLECYSTECTOMY    . COLONOSCOPY W/ POLYPECTOMY    . CRANIOTOMY N/A 11/08/2012   Procedure: CRANIOTOMY HYPOPHYSECTOMY TRANSNASAL APPROACH;  Surgeon: Randy O Kritzer, MD;  Location: [facility];  Service: Neurosurgery;  Laterality: N/A;  Transphenoidal Resection of Pituitary Tumor  . FINGER SURGERY Left 2017   Dr Gramig  . INSERTION PROSTATE RADIATION SEED    . KIDNEY STONE SURGERY    . KNEE ARTHROSCOPY  2007   left  . NEPHRECTOMY Left   . POSTERIOR CERVICAL LAMINECTOMY Left 04/24/2015   Procedure: Foraminotomy cervical five - cervical six cervical six - cervical seven left;  Surgeon: David S Jones, MD;  Location: [facility];  Service: Neurosurgery;  Laterality: Left;  . REPLACEMENT TOTAL KNEE BILATERAL      Current Outpatient Medications  Medication Sig Dispense Refill  . albuterol (PROVENTIL HFA;VENTOLIN HFA) 108 (90 Base) MCG/ACT inhaler Inhale 2 puffs into the lungs every 6 (six) hours as needed for wheezing or shortness of breath. 1 Inhaler 5  . allopurinol (ZYLOPRIM) 300 MG tablet Take 0.5 tablets (150 mg total) by mouth daily. 30 tablet 3  . amiodarone (PACERONE) 200 MG tablet Take 1 tablet (200 mg total) by mouth daily. 90 tablet 3  . apixaban (ELIQUIS) 5 MG TABS tablet Take 1 tablet (5 mg total) by mouth 2 (two) times daily. 60 tablet 1  . budesonide-formoterol (SYMBICORT)   160-4.5 MCG/ACT inhaler Inhale 2 puffs into the lungs 2 (two) times daily as needed (for respiratory flares).    . clotrimazole-betamethasone (LOTRISONE) cream Apply 1 application topically 2 (two) times daily. Use for 10 days maximum. (Patient taking differently: Apply 1 application topically 2 (two) times daily as needed (for rashes). ) 45 g 1  . colchicine 0.6 MG tablet Take 1 tablet (0.6 mg total) by mouth daily. 30 tablet 3  . cyclobenzaprine (FLEXERIL) 10 MG tablet Take 10 mg by mouth daily as needed for muscle spasms.     . diltiazem (CARDIZEM CD) 240 MG 24 hr capsule Take 1 capsule (240 mg total) by  mouth daily. 30 capsule 3  . escitalopram (LEXAPRO) 20 MG tablet TAKE ONE TABLET BY MOUTH DAILY 90 tablet 3  . furosemide (LASIX) 20 MG tablet Take 1 tablet (20 mg total) by mouth daily. 30 tablet 3  . levothyroxine (SYNTHROID, LEVOTHROID) 150 MCG tablet TAKE ONE TABLET BY MOUTH DAILY 90 tablet 2  . metoprolol tartrate (LOPRESSOR) 25 MG tablet Take 0.5 tablets (12.5 mg total) by mouth 2 (two) times daily. 60 tablet 3  . Multiple Vitamins-Minerals (MULTIVITAMINS THER. W/MINERALS) TABS Take 1 tablet by mouth daily.    . nitroGLYCERIN (NITROSTAT) 0.4 MG SL tablet Place 1 tablet (0.4 mg total) under the tongue every 5 (five) minutes as needed for chest pain. 30 tablet 12  . nortriptyline (PAMELOR) 75 MG capsule TAKE ONE CAPSULE BY MOUTH AT BEDTIME 90 capsule 1  . Suvorexant (BELSOMRA) 10 MG TABS Take 10 mg by mouth at bedtime. 30 tablet 5   No current facility-administered medications for this encounter.     No Known Allergies  Social History   Socioeconomic History  . Marital status: Married    Spouse name: Joanne  . Number of children: Y  . Years of education: Not on file  . Highest education level: Not on file  Occupational History  . Occupation: retired pharmaceutical label maker    Employer: RETIRED  Social Needs  . Financial resource strain: Not on file  . Food insecurity:    Worry: Not on file    Inability: Not on file  . Transportation needs:    Medical: Not on file    Non-medical: Not on file  Tobacco Use  . Smoking status: Former Smoker    Years: 4.00    Types: Cigars  . Smokeless tobacco: Never Used  . Tobacco comment: QUIT 2 YEARS AGO   2017  Substance and Sexual Activity  . Alcohol use: Yes    Alcohol/week: 1.2 oz    Types: 2 Shots of liquor per week    Comment: socially  . Drug use: No  . Sexual activity: Not on file  Lifestyle  . Physical activity:    Days per week: Not on file    Minutes per session: Not on file  . Stress: Not on file  Relationships  .  Social connections:    Talks on phone: Not on file    Gets together: Not on file    Attends religious service: Not on file    Active member of club or organization: Not on file    Attends meetings of clubs or organizations: Not on file    Relationship status: Not on file  . Intimate partner violence:    Fear of current or ex partner: Not on file    Emotionally abused: Not on file    Physically abused: Not on file      Forced sexual activity: Not on file  Other Topics Concern  . Not on file  Social History Narrative   Married 1965 (wife patient of Dr. Campbell). 2 sons. 4 granddaughters.       Retired pharmaceutical labeling.       Hobbies: golf, fishing, formerly hunting.     Family History  Problem Relation Age of Onset  . Cancer Father        [place], died before patient born  . Cancer Mother        stomach, died when he was young  . Kidney cancer Sister   . Colon cancer Brother 60  . Esophageal cancer Neg Hx   . Rectal cancer Neg Hx   . Stomach cancer Neg Hx     ROS- All systems are reviewed and negative except as per the HPI above  Physical Exam: Vitals:   01/31/18 1427  BP: 122/64  Pulse: 87  SpO2: 90%  Weight: (!) 300 lb 9.6 oz (136.4 kg)  Height: 5' 11" (1.803 m)   Wt Readings from Last 3 Encounters:  01/31/18 (!) 300 lb 9.6 oz (136.4 kg)  01/29/18 293 lb 12.8 oz (133.3 kg)  01/21/18 292 lb 6.4 oz (132.6 kg)    Labs: Lab Results  Component Value Date   NA 136 01/21/2018   K 4.7 01/21/2018   CL 97 01/21/2018   CO2 24 01/21/2018   GLUCOSE 82 01/21/2018   BUN 15 01/21/2018   CREATININE 1.29 (H) 01/21/2018   CALCIUM 8.8 01/21/2018   MG 1.9 11/01/2017   Lab Results  Component Value Date   INR 1.01 04/17/2015   Lab Results  Component Value Date   CHOL 141 04/12/2017   HDL 38.20 (L) 04/12/2017   LDLCALC 69 04/12/2017   TRIG 168.0 (H) 04/12/2017     GEN- The patient is well appearing, alert and oriented x 3 today.   Head- normocephalic,  atraumatic Eyes-  Sclera clear, conjunctiva pink Ears- hearing intact Oropharynx- clear Neck- supple, no JVP Lymph- no cervical lymphadenopathy Lungs-scattered rales bilaterally  Heart- Regular rate and rhythm, no murmurs, rubs or gallops, PMI not laterally displaced GI- soft, NT, ND, + BS Extremities- no clubbing, cyanosis, 2+ pitting edema MS- no significant deformity or atrophy Skin- no rash or lesion Psych- euthymic mood, full affect Neuro- strength and sensation are intact  EKG- SR with lst degree AV block, pr int 240 ms, qrs int 112 ms, qtc 464 ms    Assessment and Plan: 1. Paroxysmal afib  S/p ablation 8/2 Appears to be staying in SR Continue amiodarone 200 mg daily Continue diltiazem 240 mg daily Continue metoprolol 12.5 mg bid  2. Shortness of breath  Weight is up 7 lbs Crackles heard bilaterally + for pitting edema Continue lasix but increase to 40 mg daily, from 20 mgs daily He will take an extra 20 mg lasix this pm  Limit fluids to no more than 2 liters a day as he has been pushing fluids Avoid salt If dyspnea should get worse, report to the ER CXR today  3. Hematoma Left groin Stable, resolving  F/u here Thursday    C. , ANP-C [facility] [address] [place], [place] [address] [phone]  

## 2018-02-02 ENCOUNTER — Emergency Department (HOSPITAL_COMMUNITY): Payer: Medicare Other

## 2018-02-02 ENCOUNTER — Telehealth: Payer: Self-pay

## 2018-02-02 ENCOUNTER — Encounter (HOSPITAL_COMMUNITY): Payer: Self-pay

## 2018-02-02 ENCOUNTER — Inpatient Hospital Stay (HOSPITAL_COMMUNITY)
Admission: EM | Admit: 2018-02-02 | Discharge: 2018-02-26 | DRG: 870 | Disposition: A | Discharge status: Rehabilitation Facility | Payer: Medicare Other | Attending: Internal Medicine | Admitting: Internal Medicine

## 2018-02-02 DIAGNOSIS — M79609 Pain in unspecified limb: Secondary | ICD-10-CM | POA: Diagnosis not present

## 2018-02-02 DIAGNOSIS — N183 Chronic kidney disease, stage 3 (moderate): Secondary | ICD-10-CM | POA: Diagnosis present

## 2018-02-02 DIAGNOSIS — F419 Anxiety disorder, unspecified: Secondary | ICD-10-CM | POA: Diagnosis present

## 2018-02-02 DIAGNOSIS — I2510 Atherosclerotic heart disease of native coronary artery without angina pectoris: Secondary | ICD-10-CM | POA: Diagnosis present

## 2018-02-02 DIAGNOSIS — Z7901 Long term (current) use of anticoagulants: Secondary | ICD-10-CM

## 2018-02-02 DIAGNOSIS — E039 Hypothyroidism, unspecified: Secondary | ICD-10-CM | POA: Diagnosis present

## 2018-02-02 DIAGNOSIS — R5383 Other fatigue: Secondary | ICD-10-CM | POA: Diagnosis not present

## 2018-02-02 DIAGNOSIS — J811 Chronic pulmonary edema: Secondary | ICD-10-CM | POA: Diagnosis not present

## 2018-02-02 DIAGNOSIS — Z5320 Procedure and treatment not carried out because of patient's decision for unspecified reasons: Secondary | ICD-10-CM | POA: Diagnosis not present

## 2018-02-02 DIAGNOSIS — J90 Pleural effusion, not elsewhere classified: Secondary | ICD-10-CM | POA: Diagnosis not present

## 2018-02-02 DIAGNOSIS — J15212 Pneumonia due to Methicillin resistant Staphylococcus aureus: Secondary | ICD-10-CM | POA: Diagnosis not present

## 2018-02-02 DIAGNOSIS — Z9049 Acquired absence of other specified parts of digestive tract: Secondary | ICD-10-CM

## 2018-02-02 DIAGNOSIS — Z86711 Personal history of pulmonary embolism: Secondary | ICD-10-CM | POA: Diagnosis not present

## 2018-02-02 DIAGNOSIS — Z4682 Encounter for fitting and adjustment of non-vascular catheter: Secondary | ICD-10-CM | POA: Diagnosis not present

## 2018-02-02 DIAGNOSIS — R069 Unspecified abnormalities of breathing: Secondary | ICD-10-CM

## 2018-02-02 DIAGNOSIS — D72829 Elevated white blood cell count, unspecified: Secondary | ICD-10-CM | POA: Diagnosis not present

## 2018-02-02 DIAGNOSIS — I10 Essential (primary) hypertension: Secondary | ICD-10-CM | POA: Diagnosis present

## 2018-02-02 DIAGNOSIS — E8770 Fluid overload, unspecified: Secondary | ICD-10-CM | POA: Diagnosis not present

## 2018-02-02 DIAGNOSIS — Z87442 Personal history of urinary calculi: Secondary | ICD-10-CM

## 2018-02-02 DIAGNOSIS — R0789 Other chest pain: Secondary | ICD-10-CM | POA: Diagnosis not present

## 2018-02-02 DIAGNOSIS — J181 Lobar pneumonia, unspecified organism: Secondary | ICD-10-CM

## 2018-02-02 DIAGNOSIS — Z905 Acquired absence of kidney: Secondary | ICD-10-CM | POA: Diagnosis not present

## 2018-02-02 DIAGNOSIS — Y95 Nosocomial condition: Secondary | ICD-10-CM | POA: Diagnosis not present

## 2018-02-02 DIAGNOSIS — R739 Hyperglycemia, unspecified: Secondary | ICD-10-CM | POA: Diagnosis not present

## 2018-02-02 DIAGNOSIS — I130 Hypertensive heart and chronic kidney disease with heart failure and stage 1 through stage 4 chronic kidney disease, or unspecified chronic kidney disease: Secondary | ICD-10-CM | POA: Diagnosis present

## 2018-02-02 DIAGNOSIS — I3139 Other pericardial effusion (noninflammatory): Secondary | ICD-10-CM

## 2018-02-02 DIAGNOSIS — R0989 Other specified symptoms and signs involving the circulatory and respiratory systems: Secondary | ICD-10-CM | POA: Diagnosis not present

## 2018-02-02 DIAGNOSIS — E873 Alkalosis: Secondary | ICD-10-CM | POA: Diagnosis not present

## 2018-02-02 DIAGNOSIS — R079 Chest pain, unspecified: Secondary | ICD-10-CM | POA: Diagnosis not present

## 2018-02-02 DIAGNOSIS — M10079 Idiopathic gout, unspecified ankle and foot: Secondary | ICD-10-CM | POA: Diagnosis not present

## 2018-02-02 DIAGNOSIS — I5033 Acute on chronic diastolic (congestive) heart failure: Secondary | ICD-10-CM | POA: Diagnosis not present

## 2018-02-02 DIAGNOSIS — R509 Fever, unspecified: Secondary | ICD-10-CM | POA: Diagnosis not present

## 2018-02-02 DIAGNOSIS — J9690 Respiratory failure, unspecified, unspecified whether with hypoxia or hypercapnia: Secondary | ICD-10-CM | POA: Diagnosis not present

## 2018-02-02 DIAGNOSIS — M1990 Unspecified osteoarthritis, unspecified site: Secondary | ICD-10-CM | POA: Diagnosis not present

## 2018-02-02 DIAGNOSIS — A4102 Sepsis due to Methicillin resistant Staphylococcus aureus: Secondary | ICD-10-CM | POA: Diagnosis not present

## 2018-02-02 DIAGNOSIS — I313 Pericardial effusion (noninflammatory): Secondary | ICD-10-CM | POA: Diagnosis present

## 2018-02-02 DIAGNOSIS — Z79899 Other long term (current) drug therapy: Secondary | ICD-10-CM

## 2018-02-02 DIAGNOSIS — Z960 Presence of urogenital implants: Secondary | ICD-10-CM | POA: Diagnosis not present

## 2018-02-02 DIAGNOSIS — E871 Hypo-osmolality and hyponatremia: Secondary | ICD-10-CM | POA: Diagnosis not present

## 2018-02-02 DIAGNOSIS — K5900 Constipation, unspecified: Secondary | ICD-10-CM | POA: Diagnosis not present

## 2018-02-02 DIAGNOSIS — M1000 Idiopathic gout, unspecified site: Secondary | ICD-10-CM | POA: Diagnosis not present

## 2018-02-02 DIAGNOSIS — R0609 Other forms of dyspnea: Secondary | ICD-10-CM | POA: Diagnosis not present

## 2018-02-02 DIAGNOSIS — G4730 Sleep apnea, unspecified: Secondary | ICD-10-CM | POA: Diagnosis not present

## 2018-02-02 DIAGNOSIS — J9601 Acute respiratory failure with hypoxia: Secondary | ICD-10-CM | POA: Diagnosis present

## 2018-02-02 DIAGNOSIS — R918 Other nonspecific abnormal finding of lung field: Secondary | ICD-10-CM | POA: Diagnosis not present

## 2018-02-02 DIAGNOSIS — J189 Pneumonia, unspecified organism: Secondary | ICD-10-CM | POA: Diagnosis not present

## 2018-02-02 DIAGNOSIS — J440 Chronic obstructive pulmonary disease with acute lower respiratory infection: Secondary | ICD-10-CM | POA: Diagnosis not present

## 2018-02-02 DIAGNOSIS — I4892 Unspecified atrial flutter: Secondary | ICD-10-CM | POA: Diagnosis present

## 2018-02-02 DIAGNOSIS — F329 Major depressive disorder, single episode, unspecified: Secondary | ICD-10-CM | POA: Diagnosis present

## 2018-02-02 DIAGNOSIS — A419 Sepsis, unspecified organism: Secondary | ICD-10-CM | POA: Diagnosis not present

## 2018-02-02 DIAGNOSIS — N189 Chronic kidney disease, unspecified: Secondary | ICD-10-CM | POA: Diagnosis present

## 2018-02-02 DIAGNOSIS — I480 Paroxysmal atrial fibrillation: Secondary | ICD-10-CM | POA: Diagnosis present

## 2018-02-02 DIAGNOSIS — G9341 Metabolic encephalopathy: Secondary | ICD-10-CM | POA: Diagnosis not present

## 2018-02-02 DIAGNOSIS — R0902 Hypoxemia: Secondary | ICD-10-CM | POA: Diagnosis not present

## 2018-02-02 DIAGNOSIS — Z9119 Patient's noncompliance with other medical treatment and regimen: Secondary | ICD-10-CM

## 2018-02-02 DIAGNOSIS — Z7189 Other specified counseling: Secondary | ICD-10-CM | POA: Diagnosis not present

## 2018-02-02 DIAGNOSIS — I5043 Acute on chronic combined systolic (congestive) and diastolic (congestive) heart failure: Secondary | ICD-10-CM | POA: Diagnosis not present

## 2018-02-02 DIAGNOSIS — Z8546 Personal history of malignant neoplasm of prostate: Secondary | ICD-10-CM

## 2018-02-02 DIAGNOSIS — R6521 Severe sepsis with septic shock: Secondary | ICD-10-CM | POA: Diagnosis present

## 2018-02-02 DIAGNOSIS — H5789 Other specified disorders of eye and adnexa: Secondary | ICD-10-CM | POA: Diagnosis not present

## 2018-02-02 DIAGNOSIS — Z85528 Personal history of other malignant neoplasm of kidney: Secondary | ICD-10-CM

## 2018-02-02 DIAGNOSIS — R778 Other specified abnormalities of plasma proteins: Secondary | ICD-10-CM

## 2018-02-02 DIAGNOSIS — F05 Delirium due to known physiological condition: Secondary | ICD-10-CM | POA: Diagnosis not present

## 2018-02-02 DIAGNOSIS — Z01818 Encounter for other preprocedural examination: Secondary | ICD-10-CM

## 2018-02-02 DIAGNOSIS — I2699 Other pulmonary embolism without acute cor pulmonale: Secondary | ICD-10-CM | POA: Diagnosis not present

## 2018-02-02 DIAGNOSIS — Z6838 Body mass index (BMI) 38.0-38.9, adult: Secondary | ICD-10-CM

## 2018-02-02 DIAGNOSIS — N179 Acute kidney failure, unspecified: Secondary | ICD-10-CM | POA: Diagnosis present

## 2018-02-02 DIAGNOSIS — Z7951 Long term (current) use of inhaled steroids: Secondary | ICD-10-CM

## 2018-02-02 DIAGNOSIS — Z9289 Personal history of other medical treatment: Secondary | ICD-10-CM

## 2018-02-02 DIAGNOSIS — R5381 Other malaise: Secondary | ICD-10-CM | POA: Diagnosis not present

## 2018-02-02 DIAGNOSIS — Z87891 Personal history of nicotine dependence: Secondary | ICD-10-CM | POA: Diagnosis not present

## 2018-02-02 DIAGNOSIS — R0689 Other abnormalities of breathing: Secondary | ICD-10-CM | POA: Diagnosis not present

## 2018-02-02 DIAGNOSIS — Z888 Allergy status to other drugs, medicaments and biological substances status: Secondary | ICD-10-CM

## 2018-02-02 DIAGNOSIS — Z6841 Body Mass Index (BMI) 40.0 and over, adult: Secondary | ICD-10-CM

## 2018-02-02 DIAGNOSIS — G4733 Obstructive sleep apnea (adult) (pediatric): Secondary | ICD-10-CM | POA: Diagnosis present

## 2018-02-02 DIAGNOSIS — I2782 Chronic pulmonary embolism: Secondary | ICD-10-CM | POA: Diagnosis present

## 2018-02-02 DIAGNOSIS — I447 Left bundle-branch block, unspecified: Secondary | ICD-10-CM | POA: Diagnosis present

## 2018-02-02 DIAGNOSIS — Z79890 Hormone replacement therapy (postmenopausal): Secondary | ICD-10-CM

## 2018-02-02 DIAGNOSIS — R0600 Dyspnea, unspecified: Secondary | ICD-10-CM

## 2018-02-02 DIAGNOSIS — R7989 Other specified abnormal findings of blood chemistry: Secondary | ICD-10-CM

## 2018-02-02 DIAGNOSIS — I129 Hypertensive chronic kidney disease with stage 1 through stage 4 chronic kidney disease, or unspecified chronic kidney disease: Secondary | ICD-10-CM | POA: Diagnosis not present

## 2018-02-02 DIAGNOSIS — E876 Hypokalemia: Secondary | ICD-10-CM | POA: Diagnosis not present

## 2018-02-02 DIAGNOSIS — Z9911 Dependence on respirator [ventilator] status: Secondary | ICD-10-CM

## 2018-02-02 DIAGNOSIS — Z95828 Presence of other vascular implants and grafts: Secondary | ICD-10-CM | POA: Diagnosis not present

## 2018-02-02 DIAGNOSIS — J810 Acute pulmonary edema: Secondary | ICD-10-CM | POA: Diagnosis not present

## 2018-02-02 DIAGNOSIS — Z978 Presence of other specified devices: Secondary | ICD-10-CM

## 2018-02-02 DIAGNOSIS — R05 Cough: Secondary | ICD-10-CM | POA: Diagnosis not present

## 2018-02-02 DIAGNOSIS — M7989 Other specified soft tissue disorders: Secondary | ICD-10-CM | POA: Diagnosis not present

## 2018-02-02 DIAGNOSIS — T148XXA Other injury of unspecified body region, initial encounter: Secondary | ICD-10-CM | POA: Diagnosis not present

## 2018-02-02 DIAGNOSIS — E890 Postprocedural hypothyroidism: Secondary | ICD-10-CM | POA: Diagnosis not present

## 2018-02-02 DIAGNOSIS — I959 Hypotension, unspecified: Secondary | ICD-10-CM | POA: Diagnosis not present

## 2018-02-02 DIAGNOSIS — R0602 Shortness of breath: Secondary | ICD-10-CM | POA: Diagnosis not present

## 2018-02-02 DIAGNOSIS — I4891 Unspecified atrial fibrillation: Secondary | ICD-10-CM | POA: Diagnosis not present

## 2018-02-02 DIAGNOSIS — E785 Hyperlipidemia, unspecified: Secondary | ICD-10-CM | POA: Diagnosis present

## 2018-02-02 DIAGNOSIS — R748 Abnormal levels of other serum enzymes: Secondary | ICD-10-CM | POA: Diagnosis not present

## 2018-02-02 DIAGNOSIS — Z9981 Dependence on supplemental oxygen: Secondary | ICD-10-CM

## 2018-02-02 DIAGNOSIS — T380X5A Adverse effect of glucocorticoids and synthetic analogues, initial encounter: Secondary | ICD-10-CM | POA: Diagnosis not present

## 2018-02-02 DIAGNOSIS — J168 Pneumonia due to other specified infectious organisms: Secondary | ICD-10-CM | POA: Diagnosis not present

## 2018-02-02 DIAGNOSIS — M109 Gout, unspecified: Secondary | ICD-10-CM | POA: Diagnosis not present

## 2018-02-02 DIAGNOSIS — Z789 Other specified health status: Secondary | ICD-10-CM

## 2018-02-02 DIAGNOSIS — Z96653 Presence of artificial knee joint, bilateral: Secondary | ICD-10-CM | POA: Diagnosis present

## 2018-02-02 DIAGNOSIS — J9811 Atelectasis: Secondary | ICD-10-CM | POA: Diagnosis not present

## 2018-02-02 LAB — CBC WITH DIFFERENTIAL/PLATELET
ABS IMMATURE GRANULOCYTES: 0.1 10*3/uL (ref 0.0–0.1)
Basophils Absolute: 0.1 10*3/uL (ref 0.0–0.1)
Basophils Relative: 0 %
Eosinophils Absolute: 0.7 10*3/uL (ref 0.0–0.7)
Eosinophils Relative: 4 %
HEMATOCRIT: 39.7 % (ref 39.0–52.0)
Hemoglobin: 13.1 g/dL (ref 13.0–17.0)
IMMATURE GRANULOCYTES: 1 %
LYMPHS ABS: 1.6 10*3/uL (ref 0.7–4.0)
LYMPHS PCT: 10 %
MCH: 30 pg (ref 26.0–34.0)
MCHC: 33 g/dL (ref 30.0–36.0)
MCV: 91.1 fL (ref 78.0–100.0)
MONOS PCT: 11 %
Monocytes Absolute: 1.8 10*3/uL — ABNORMAL HIGH (ref 0.1–1.0)
NEUTROS ABS: 12.4 10*3/uL — AB (ref 1.7–7.7)
NEUTROS PCT: 74 %
Platelets: 307 10*3/uL (ref 150–400)
RBC: 4.36 MIL/uL (ref 4.22–5.81)
RDW: 14.4 % (ref 11.5–15.5)
WBC: 16.6 10*3/uL — AB (ref 4.0–10.5)

## 2018-02-02 LAB — BASIC METABOLIC PANEL
Anion gap: 10 (ref 5–15)
BUN: 13 mg/dL (ref 8–23)
CHLORIDE: 96 mmol/L — AB (ref 98–111)
CO2: 25 mmol/L (ref 22–32)
CREATININE: 1.35 mg/dL — AB (ref 0.61–1.24)
Calcium: 8.4 mg/dL — ABNORMAL LOW (ref 8.9–10.3)
GFR calc non Af Amer: 50 mL/min — ABNORMAL LOW (ref >60)
GFR, EST AFRICAN AMERICAN: 58 mL/min — AB (ref >60)
Glucose, Bld: 97 mg/dL (ref 70–99)
POTASSIUM: 3.9 mmol/L (ref 3.5–5.1)
SODIUM: 131 mmol/L — AB (ref 135–145)

## 2018-02-02 LAB — I-STAT CG4 LACTIC ACID, ED: LACTIC ACID, VENOUS: 1.51 mmol/L (ref 0.5–1.9)

## 2018-02-02 LAB — I-STAT TROPONIN, ED: Troponin i, poc: 0.12 ng/mL (ref 0.00–0.08)

## 2018-02-02 LAB — BRAIN NATRIURETIC PEPTIDE: B NATRIURETIC PEPTIDE 5: 54.3 pg/mL (ref 0.0–100.0)

## 2018-02-02 MED ORDER — VANCOMYCIN HCL 10 G IV SOLR
1500.0000 mg | INTRAVENOUS | Status: DC
  Filled 2018-02-02: qty 1500

## 2018-02-02 MED ORDER — MORPHINE SULFATE (PF) 4 MG/ML IV SOLN
4.0000 mg | Freq: Once | INTRAVENOUS | Status: AC
  Administered 2018-02-02: 4 mg via INTRAVENOUS
  Filled 2018-02-02: qty 1

## 2018-02-02 MED ORDER — VANCOMYCIN HCL 10 G IV SOLR
2500.0000 mg | INTRAVENOUS | Status: AC
  Administered 2018-02-02: 2500 mg via INTRAVENOUS
  Filled 2018-02-02: qty 2500

## 2018-02-02 MED ORDER — SODIUM CHLORIDE 0.9 % IV SOLN
Freq: Once | INTRAVENOUS | Status: AC
  Administered 2018-02-02: 19:00:00 via INTRAVENOUS

## 2018-02-02 MED ORDER — SODIUM CHLORIDE 0.9 % IV SOLN
2.0000 g | Freq: Once | INTRAVENOUS | Status: AC
  Administered 2018-02-02: 2 g via INTRAVENOUS
  Filled 2018-02-02: qty 2

## 2018-02-02 MED ORDER — SODIUM CHLORIDE 0.9 % IV SOLN
1.0000 g | Freq: Once | INTRAVENOUS | Status: DC
  Filled 2018-02-02: qty 1

## 2018-02-02 NOTE — ED Provider Notes (Addendum)
[place] EMERGENCY DEPARTMENT Provider Note   CSN: [id number] Arrival date & time: 02/02/18  1659     History   Chief Complaint Chief Complaint  Patient presents with  . Shortness of Breath    HPI Wayne Booth is a 77 y.o. male.  HPI  Past Medical History:  Diagnosis Date  . Allergy    States his nose runs when he is around grease.   Marland Kitchen Anxiety   . Arthritis   . Atrial fibrillation ([place])   . Cancer ([place])   . Coronary artery disease   . History of total knee replacement   . Hypertension   . Hypothyroidism   . Kidney cancer, primary, with metastasis from kidney to other site [facility])   . Kidney stone   . OSA (obstructive sleep apnea)    pt does not wear cpap at night  . Pituitary cyst ([place])   . Pneumonia    hx of  . Prostate cancer [facility])     Patient Active Problem List   Diagnosis Date Noted  . Paroxysmal atrial fibrillation ([place]) 01/28/2018  . Constipation 01/21/2018  . (HFpEF) heart failure with preserved ejection fraction ([place]) 12/16/2017  . Chest pain   . Anticoagulated   . Atrial flutter ([place]) 11/01/2017  . Bilateral foot pain 07/13/2017  . Community acquired pneumonia 06/18/2017  . Shortness of breath 06/14/2017  . History of adenomatous polyp of colon 04/12/2017  . History of pulmonary embolism 10/23/2016  . Cervical disc disease 11/13/2015  . Essential tremor 08/14/2015  . Gout 08/14/2015  . Hypothyroidism 07/12/2014  . Hyperglycemia 07/12/2014  . GAD (generalized anxiety disorder) 11/07/2013  . OSA (obstructive sleep apnea) 07/07/2011  . Asthmatic bronchitis 06/09/2011  . Actinic keratosis 11/27/2009  . Osteoarthritis 11/13/2008  . 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 Morbid obesity ([place]) 10/02/2008  . History of renal cell carcinoma 12/26/2007  . TESTOSTERONE DEFICIENCY 12/28/2006  . Essential hypertension 12/28/2006    Past Surgical History:  Procedure Laterality Date  . ABLATION OF DYSRHYTHMIC FOCUS  01/28/2018    . ANTERIOR CERVICAL DECOMP/DISCECTOMY FUSION N/A 08/29/2012   Procedure: ANTERIOR CERVICAL DECOMPRESSION/DISCECTOMY FUSION 1 LEVEL;  Surgeon: Eustace Moore, MD;  Location: [place] NEURO ORS;  Service: Neurosurgery;  Laterality: N/A;  . APPENDECTOMY    . ATRIAL FIBRILLATION ABLATION N/A 01/28/2018   Procedure: ATRIAL FIBRILLATION ABLATION;  Surgeon: Constance Haw, MD;  Location: [place] CV LAB;  Service: Cardiovascular;  Laterality: N/A;  . CARDIAC CATHETERIZATION  2008   clean  . CHOLECYSTECTOMY    . COLONOSCOPY W/ POLYPECTOMY    . CRANIOTOMY N/A 11/08/2012   Procedure: CRANIOTOMY HYPOPHYSECTOMY TRANSNASAL APPROACH;  Surgeon: Faythe Ghee, MD;  Location: [place] NEURO ORS;  Service: Neurosurgery;  Laterality: N/A;  Transphenoidal Resection of Pituitary Tumor  . FINGER SURGERY Left 2017   Dr Amedeo Plenty  . INSERTION PROSTATE RADIATION SEED    . KIDNEY STONE SURGERY    . KNEE ARTHROSCOPY  2007   left  . NEPHRECTOMY Left   . POSTERIOR CERVICAL LAMINECTOMY Left 04/24/2015   Procedure: Foraminotomy cervical five - cervical six cervical six - cervical seven left;  Surgeon: Eustace Moore, MD;  Location: [facility] NEURO ORS;  Service: Neurosurgery;  Laterality: Left;  . REPLACEMENT TOTAL KNEE BILATERAL          Home Medications    Prior to Admission medications   Medication Sig Start Date End Date Taking? Authorizing Provider  albuterol (PROVENTIL HFA;VENTOLIN HFA) 108 (  90 Base) MCG/ACT inhaler Inhale 2 puffs into the lungs every 6 (six) hours as needed for wheezing or shortness of breath. 08/11/17  Yes Rigoberto Noel, MD  allopurinol (ZYLOPRIM) 300 MG tablet Take 0.5 tablets (150 mg total) by mouth daily. 12/07/17  Yes Rai, Ripudeep K, MD  amiodarone (PACERONE) 200 MG tablet Take 1 tablet (200 mg total) by mouth daily. 01/28/18  Yes Camnitz, Ocie Doyne, MD  apixaban (ELIQUIS) 5 MG TABS tablet Take 1 tablet (5 mg total) by mouth 2 (two) times daily. 11/03/17  Yes Georgette Shell, MD   budesonide-formoterol [facility]) 160-4.5 MCG/ACT inhaler Inhale 2 puffs into the lungs 2 (two) times daily as needed (for respiratory flares).   Yes [provider]  clotrimazole-betamethasone (LOTRISONE) cream Apply 1 application topically 2 (two) times daily. Use for 10 days maximum. Patient taking differently: Apply 1 application topically 2 (two) times daily as needed (for rashes).  08/27/16  Yes Marin Olp, MD  colchicine 0.6 MG tablet Take 1 tablet (0.6 mg total) by mouth daily. 12/07/17  Yes Rai, Ripudeep K, MD  cyclobenzaprine (FLEXERIL) 10 MG tablet Take 10 mg by mouth daily as needed for muscle spasms.  09/30/17  Yes [provider]  diltiazem (CARDIZEM CD) 240 MG 24 hr capsule Take 1 capsule (240 mg total) by mouth daily. 12/07/17  Yes Rai, Ripudeep K, MD  escitalopram (LEXAPRO) 20 MG tablet TAKE ONE TABLET BY MOUTH DAILY 01/27/18  Yes Marin Olp, MD  furosemide (LASIX) 20 MG tablet Take 1 tablet (20 mg total) by mouth daily. 12/07/17  Yes Rai, Ripudeep K, MD  levothyroxine (SYNTHROID, LEVOTHROID) 150 MCG tablet TAKE ONE TABLET BY MOUTH DAILY 07/06/17  Yes Marin Olp, MD  metoprolol tartrate (LOPRESSOR) 25 MG tablet Take 0.5 tablets (12.5 mg total) by mouth 2 (two) times daily. 12/07/17  Yes Rai, Vernelle Emerald, MD  Multiple Vitamins-Minerals (MULTIVITAMINS THER. W/MINERALS) TABS Take 1 tablet by mouth daily.   Yes [provider]  nitroGLYCERIN (NITROSTAT) 0.4 MG SL tablet Place 1 tablet (0.4 mg total) under the tongue every 5 (five) minutes as needed for chest pain. 12/07/17  Yes Rai, Ripudeep K, MD  nortriptyline (PAMELOR) 75 MG capsule TAKE ONE CAPSULE BY MOUTH AT BEDTIME 12/31/17  Yes Marin Olp, MD  Suvorexant (BELSOMRA) 10 MG TABS Take 10 mg by mouth at bedtime. 12/27/17  Yes Marin Olp, MD    Family History Family History  Problem Relation Age of Onset  . Cancer Father        ?mouth, died before patient born  . Cancer Mother         stomach, died when he was young  . Kidney cancer Sister   . Colon cancer Brother 12  . Esophageal cancer Neg Hx   . Rectal cancer Neg Hx   . Stomach cancer Neg Hx     Social History Social History   Tobacco Use  . Smoking status: Former Smoker    Years: 4.00    Types: Cigars  . Smokeless tobacco: Never Used  . Tobacco comment: QUIT 2 YEARS AGO   2017  Substance Use Topics  . Alcohol use: Yes    Alcohol/week: 1.2 oz    Types: 2 Shots of liquor per week    Comment: socially  . Drug use: No     Allergies   Patient has no known allergies.   Review of Systems Review of Systems   Physical Exam Updated Vital  Signs BP 126/73   Pulse 91   Temp (!) 100.7 F (38.2 C) (Oral)   Resp (!) 31   Ht 5\' 11"  (1.803 m)   Wt 132.5 kg (292 lb)   SpO2 94%   BMI 40.73 kg/m   Physical Exam   ED Treatments / Results  Labs (all labs ordered are listed, but only abnormal results are displayed) Labs Reviewed  BASIC METABOLIC PANEL - Abnormal; Notable for the following components:      Result Value   Sodium 131 (*)    Chloride 96 (*)    Creatinine, Ser 1.35 (*)    Calcium 8.4 (*)    GFR calc non Af Amer 50 (*)    GFR calc Af Amer 58 (*)    All other components within normal limits  CBC WITH DIFFERENTIAL/PLATELET - Abnormal; Notable for the following components:   WBC 16.6 (*)    Neutro Abs 12.4 (*)    Monocytes Absolute 1.8 (*)    All other components within normal limits  I-STAT TROPONIN, ED - Abnormal; Notable for the following components:   Troponin i, poc 0.12 (*)    All other components within normal limits  CULTURE, BLOOD (ROUTINE X 2)  CULTURE, BLOOD (ROUTINE X 2)  BRAIN NATRIURETIC PEPTIDE  I-STAT CG4 LACTIC ACID, ED    EKG EKG Interpretation  Date/Time:  Wednesday February 02 2018 19:59:47 EDT Ventricular Rate:  90 PR Interval:    QRS Duration: 111 QT Interval:  372 QTC Calculation: 456 R Axis:   -21 Text Interpretation:  Sinus rhythm Prolonged PR  interval Incomplete left bundle branch block Low voltage, precordial leads ST elevation, consider inferior injury similar to prior earlier today Confirmed by Aletta Edouard [phone]) on 02/02/2018 8:03:08 PM   Radiology Dg Chest Port 1 View  Result Date: 02/02/2018 CLINICAL DATA:  Shortness of breath and RIGHT chest pain. Recent atrial fibrillation. EXAM: PORTABLE CHEST 1 VIEW COMPARISON:  Chest radiograph January 31, 2018 FINDINGS: Similar cardiomegaly. Calcified aortic arch. Pulmonary vasculature congestion and interstitial prominence with RIGHT mid lung zone consolidation. Small bilateral pleural effusions. No pneumothorax. No pneumothorax. Soft tissue planes and included osseous structures are non suspicious. IMPRESSION: 1. New RIGHT mid lung zone consolidation concerning for pneumonia versus confluent edema. 2. Stable cardiomegaly and interstitial edema with small pleural effusions. Electronically Signed   By: Elon Alas M.D.   On: 02/02/2018 17:58    Procedures .Critical Care Performed by: Hayden Rasmussen, MD Authorized by: Hayden Rasmussen, MD   Critical care provider statement:    Critical care time (minutes):  45   Critical care was necessary to treat or prevent imminent or life-threatening deterioration of the following conditions:  Sepsis and respiratory failure   Critical care was time spent personally by me on the following activities:  Discussions with consultants, evaluation of patient's response to treatment, examination of patient, ordering and performing treatments and interventions, ordering and review of laboratory studies, ordering and review of radiographic studies, pulse oximetry, re-evaluation of patient's condition, obtaining history from patient or surrogate, review of old charts and development of treatment plan with patient or surrogate   I assumed direction of critical care for this patient from another provider in my specialty: no     (including critical care  time)  Medications Ordered in ED Medications  vancomycin (VANCOCIN) 2,500 mg in sodium chloride 0.9 % 500 mL IVPB (2,500 mg Intravenous New Bag/Given 02/02/18 1916)  vancomycin (VANCOCIN) 1,500 mg in sodium  chloride 0.9 % 500 mL IVPB (has no administration in time range)  morphine 4 MG/ML injection 4 mg (4 mg Intravenous Given 02/02/18 1845)  ceFEPIme (MAXIPIME) 2 g in sodium chloride 0.9 % 100 mL IVPB (0 g Intravenous Stopped 02/02/18 1916)  0.9 %  sodium chloride infusion ( Intravenous New Bag/Given 02/02/18 1843)  morphine 4 MG/ML injection 4 mg (4 mg Intravenous Given 02/02/18 1920)     Initial Impression / Assessment and Plan / ED Course  I have reviewed the triage vital signs and the nursing notes.  Pertinent labs & imaging results that were available during my care of the patient were reviewed by me and considered in my medical decision making (see chart for details).  Clinical Course as of Feb 03 929  Wed Feb 02, 2018  1747 Place patient on BiPAP as he is looking for other uncomfortable.  Is also been repositioned and given some morphine for his pain in his back and his chest.   [MB]  1841 Discussed with Dr. Martinique from cardiology.  He feels in the setting of pneumonia he would recommend the patient be admitted to medical service and cardiology will consult.  As far as the troponin being elevated he is not sure that that could be adequately explained by the ablation.   [MB]  2114 Discussed with Dr. Cyndia Diver Hospitalist for admission,  who asked if I would do a bedside ultrasound to evaluate for pericardial effusion.  I did a bedside ultrasound and it does look like he has a moderate effusion.  I have paged cardiology to review these findings.   [MB]  2123 Discussed with Dr. Hassell Done from cardiology.  She is asking to have the echo tech called in to do a formal study.  Our Network engineer in the ED is placing a call.   [MB]  2155 Patient was signed out to the oncoming team including Dr. Darl Householder.    [MB]    Clinical Course User Index [MB] Hayden Rasmussen, MD     EMERGENCY DEPARTMENT [place] CARDIAC EXAM "Study: Limited Ultrasound of the Heart and Pericardium"  INDICATIONS:Dyspnea Multiple views of the heart and pericardium were obtained in real-time with a multi-frequency probe.  PERFORMED MH:DQQIWL IMAGES ARCHIVED?: Yes LIMITATIONS:  Body habitus VIEWS USED: Parasternal short axis INTERPRETATION: Pericardial effusion present   Final Clinical Impressions(s) / ED Diagnoses   Final diagnoses:  Pneumonia of right middle lobe due to infectious organism [facility])  Chest pain, unspecified type  Pericardial effusion    ED Discharge Orders    None       Hayden Rasmussen, MD 02/03/18 0932    Hayden Rasmussen, MD 02/11/18 1358

## 2018-02-02 NOTE — Progress Notes (Signed)
Pharmacy Antibiotic Note  Wayne Booth is a 77 y.o. male admitted on 02/02/2018 with pneumonia.  Pharmacy has been consulted for vancomycin dosing.  Plan: Vancomycin IV 2500mg  x1 then 1500mg  Q24H Check vancomycin trough at steady state. Goal trough 15-20 mcg/mL Cefepime 2g x1 F/u on gram negative coverage continuation F/u on cx, LOT, monitor labs and renal function   Height: 5\' 11"  (180.3 cm) Weight: 292 lb (132.5 kg) IBW/kg (Calculated) : 75.3  Temp (24hrs), Avg:100.7 F (38.2 C), Min:100.7 F (38.2 C), Max:100.7 F (38.2 C)  Recent Labs  Lab 02/02/18 1821  LATICACIDVEN 1.51    Estimated Creatinine Clearance: 68.7 mL/min (A) (by C-G formula based on SCr of 1.29 mg/dL (H)).    No Known Allergies  Antimicrobials this admission: Vancomycin 8/7 >>  Cefepime 8/7 >>   Dose adjustments this admission:   Microbiology results: BCx:  UCx:  Sputum:  MRSA PCR:   Thank you for allowing pharmacy to be a part of this patient's care.  Harrietta Guardian, PharmD PGY1 Pharmacy Resident 02/02/2018    7:14 PM

## 2018-02-02 NOTE — Progress Notes (Signed)
Prelim Echo Results  Called by ED for stat TTE after bedside ultrasound in the ED showed possible large pericardial effusion. Echo tech called in for limited stat TTE to evaluate.  Study was technically difficult. Prelim TTE results: trace-small pericardial effusion, measuring <0.5cm. No evidence of tamponade physiology.  Right and left ventricular systolic function grossly normal.  Rudean Curt, MD , [facility] 02/02/18 11:08 PM

## 2018-02-02 NOTE — Telephone Encounter (Signed)
Follow up   Pt c/o Shortness Of Breath: STAT if SOB developed within the last 24 hours or pt is noticeably SOB on the phone  1. Are you currently SOB (can you hear that pt is SOB on the phone)? YES  2. How long have you been experiencing SOB? Since ablation 8/2  3. Are you SOB when sitting or when up moving around? Sitting and moving 4. Are you currently experiencing any other symptoms?Pain in chest- right side

## 2018-02-02 NOTE — ED Triage Notes (Signed)
Ems reports pt had an ablation on Friday. Lasix doubled and fluid restricted. Increase SOB and CP this morning. Given 3NTG and ASA, 4mg  Zofran. Heart rate 90's. bp 126/76, [place] and NRB used. Fever 100.7

## 2018-02-02 NOTE — ED Notes (Signed)
IV team was called to get a line and blood. The second set of cultures will be collected by phlebotomy. Dr Melina Copa advised that the antiobiotics could be started before the culture was drawn, but they were not here from pharmacy. Calling pharmacy to request meds.

## 2018-02-02 NOTE — ED Notes (Signed)
Pt CBG 85

## 2018-02-02 NOTE — Progress Notes (Signed)
  Echocardiogram 2D Echocardiogram has been performed.  Madelaine Etienne 02/02/2018, 11:00 PM

## 2018-02-02 NOTE — ED Provider Notes (Signed)
[facility] EMERGENCY DEPARTMENT Provider Note   CSN: [id number] Arrival date & time: 02/02/18  1659     History   Chief Complaint Chief Complaint  Patient presents with  . Shortness of Breath    HPI Wayne Booth is a 77 y.o. male.  He has a history of A. fib and was just in the hospital for a cardiac ablation about 5 days ago.  He began experiencing increased shortness of breath last evening that acutely worsened today.  It is associated with some right-sided chest pain that radiates through to his back.  He usually does not require oxygen.  He has had increased swelling in his legs also.  He had been in the office a few days ago and they increased his Lasix.  He is felt worse despite increasing his Lasix.  He said he has had a cough but it has been nonproductive.  He states the chest pain was 10 out of 10 but now was 2 out of 10.  He is on Eliquis for anticoagulation.  The history is provided by the patient and a relative.  Shortness of Breath  This is a new problem. The current episode started yesterday. The problem has been rapidly worsening. Associated symptoms include cough, chest pain, leg pain and leg swelling. Pertinent negatives include no fever, no headaches, no rhinorrhea, no sore throat, no sputum production, no hemoptysis, no syncope, no vomiting, no abdominal pain and no rash. It is unknown what precipitated the problem. Risk factors: cardiac ablation. The treatment provided no relief. He has had prior hospitalizations. He has had prior ED visits. Associated medical issues include pneumonia, CAD and heart failure.    Past Medical History:  Diagnosis Date  . Allergy    States his nose runs when he is around grease.   Marland Kitchen Anxiety   . Arthritis   . Atrial fibrillation ([place])   . Cancer ([place])   . Coronary artery disease   . History of total knee replacement   . Hypertension   . Hypothyroidism   . Kidney cancer, primary, with metastasis from kidney to  other site [facility])   . Kidney stone   . OSA (obstructive sleep apnea)    pt does not wear cpap at night  . Pituitary cyst ([place])   . Pneumonia    hx of  . Prostate cancer [facility])     Patient Active Problem List   Diagnosis Date Noted  . Paroxysmal atrial fibrillation ([place]) 01/28/2018  . Constipation 01/21/2018  . (HFpEF) heart failure with preserved ejection fraction ([place]) 12/16/2017  . Chest pain   . Anticoagulated   . Atrial flutter ([place]) 11/01/2017  . Bilateral foot pain 07/13/2017  . Community acquired pneumonia 06/18/2017  . Shortness of breath 06/14/2017  . History of adenomatous polyp of colon 04/12/2017  . History of pulmonary embolism 10/23/2016  . Cervical disc disease 11/13/2015  . Essential tremor 08/14/2015  . Gout 08/14/2015  . Hypothyroidism 07/12/2014  . Hyperglycemia 07/12/2014  . GAD (generalized anxiety disorder) 11/07/2013  . OSA (obstructive sleep apnea) 07/07/2011  . Asthmatic bronchitis 06/09/2011  . Actinic keratosis 11/27/2009  . Osteoarthritis 11/13/2008  . 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 Morbid obesity ([place]) 10/02/2008  . History of renal cell carcinoma 12/26/2007  . TESTOSTERONE DEFICIENCY 12/28/2006  . Essential hypertension 12/28/2006    Past Surgical History:  Procedure Laterality Date  . ABLATION OF DYSRHYTHMIC FOCUS  01/28/2018  . ANTERIOR CERVICAL DECOMP/DISCECTOMY FUSION N/A 08/29/2012  Procedure: ANTERIOR CERVICAL DECOMPRESSION/DISCECTOMY FUSION 1 LEVEL;  Surgeon: Eustace Moore, MD;  Location: [place] NEURO ORS;  Service: Neurosurgery;  Laterality: N/A;  . APPENDECTOMY    . ATRIAL FIBRILLATION ABLATION N/A 01/28/2018   Procedure: ATRIAL FIBRILLATION ABLATION;  Surgeon: Constance Haw, MD;  Location: [place] CV LAB;  Service: Cardiovascular;  Laterality: N/A;  . CARDIAC CATHETERIZATION  2008   clean  . CHOLECYSTECTOMY    . COLONOSCOPY W/ POLYPECTOMY    . CRANIOTOMY N/A 11/08/2012   Procedure: CRANIOTOMY HYPOPHYSECTOMY  TRANSNASAL APPROACH;  Surgeon: Faythe Ghee, MD;  Location: [place] NEURO ORS;  Service: Neurosurgery;  Laterality: N/A;  Transphenoidal Resection of Pituitary Tumor  . FINGER SURGERY Left 2017   Dr Amedeo Plenty  . INSERTION PROSTATE RADIATION SEED    . KIDNEY STONE SURGERY    . KNEE ARTHROSCOPY  2007   left  . NEPHRECTOMY Left   . POSTERIOR CERVICAL LAMINECTOMY Left 04/24/2015   Procedure: Foraminotomy cervical five - cervical six cervical six - cervical seven left;  Surgeon: Eustace Moore, MD;  Location: [facility] NEURO ORS;  Service: Neurosurgery;  Laterality: Left;  . REPLACEMENT TOTAL KNEE BILATERAL          Home Medications    Prior to Admission medications   Medication Sig Start Date End Date Taking? Authorizing Provider  albuterol (PROVENTIL HFA;VENTOLIN HFA) 108 (90 Base) MCG/ACT inhaler Inhale 2 puffs into the lungs every 6 (six) hours as needed for wheezing or shortness of breath. 08/11/17   Rigoberto Noel, MD  allopurinol (ZYLOPRIM) 300 MG tablet Take 0.5 tablets (150 mg total) by mouth daily. 12/07/17   Rai, Vernelle Emerald, MD  amiodarone (PACERONE) 200 MG tablet Take 1 tablet (200 mg total) by mouth daily. 01/28/18   Camnitz, Ocie Doyne, MD  apixaban (ELIQUIS) 5 MG TABS tablet Take 1 tablet (5 mg total) by mouth 2 (two) times daily. 11/03/17   Georgette Shell, MD  budesonide-formoterol [facility]) 160-4.5 MCG/ACT inhaler Inhale 2 puffs into the lungs 2 (two) times daily as needed (for respiratory flares).    [provider]  clotrimazole-betamethasone (LOTRISONE) cream Apply 1 application topically 2 (two) times daily. Use for 10 days maximum. Patient taking differently: Apply 1 application topically 2 (two) times daily as needed (for rashes).  08/27/16   Marin Olp, MD  colchicine 0.6 MG tablet Take 1 tablet (0.6 mg total) by mouth daily. 12/07/17   Rai, Vernelle Emerald, MD  cyclobenzaprine (FLEXERIL) 10 MG tablet Take 10 mg by mouth daily as needed for muscle spasms.  09/30/17    [provider]  diltiazem (CARDIZEM CD) 240 MG 24 hr capsule Take 1 capsule (240 mg total) by mouth daily. 12/07/17   Rai, Vernelle Emerald, MD  escitalopram (LEXAPRO) 20 MG tablet TAKE ONE TABLET BY MOUTH DAILY 01/27/18   Marin Olp, MD  furosemide (LASIX) 20 MG tablet Take 1 tablet (20 mg total) by mouth daily. 12/07/17   Rai, Vernelle Emerald, MD  levothyroxine (SYNTHROID, LEVOTHROID) 150 MCG tablet TAKE ONE TABLET BY MOUTH DAILY 07/06/17   Marin Olp, MD  metoprolol tartrate (LOPRESSOR) 25 MG tablet Take 0.5 tablets (12.5 mg total) by mouth 2 (two) times daily. 12/07/17   Rai, Vernelle Emerald, MD  Multiple Vitamins-Minerals (MULTIVITAMINS THER. W/MINERALS) TABS Take 1 tablet by mouth daily.    [provider]  nitroGLYCERIN (NITROSTAT) 0.4 MG SL tablet Place 1 tablet (0.4 mg total) under the tongue every 5 (five) minutes as  needed for chest pain. 12/07/17   Rai, Vernelle Emerald, MD  nortriptyline (PAMELOR) 75 MG capsule TAKE ONE CAPSULE BY MOUTH AT BEDTIME 12/31/17   Marin Olp, MD  Suvorexant (BELSOMRA) 10 MG TABS Take 10 mg by mouth at bedtime. 12/27/17   Marin Olp, MD    Family History Family History  Problem Relation Age of Onset  . Cancer Father        ?mouth, died before patient born  . Cancer Mother        stomach, died when he was young  . Kidney cancer Sister   . Colon cancer Brother 8  . Esophageal cancer Neg Hx   . Rectal cancer Neg Hx   . Stomach cancer Neg Hx     Social History Social History   Tobacco Use  . Smoking status: Former Smoker    Years: 4.00    Types: Cigars  . Smokeless tobacco: Never Used  . Tobacco comment: QUIT 2 YEARS AGO   2017  Substance Use Topics  . Alcohol use: Yes    Alcohol/week: 1.2 oz    Types: 2 Shots of liquor per week    Comment: socially  . Drug use: No     Allergies   Patient has no known allergies.   Review of Systems Review of Systems  Constitutional: Negative for fever.  HENT: Negative for rhinorrhea  and sore throat.   Eyes: Negative for visual disturbance.  Respiratory: Positive for cough and shortness of breath. Negative for hemoptysis and sputum production.   Cardiovascular: Positive for chest pain and leg swelling. Negative for syncope.  Gastrointestinal: Negative for abdominal pain, nausea and vomiting.  Genitourinary: Negative for dysuria and frequency.  Musculoskeletal: Positive for back pain.  Skin: Negative for rash.  Neurological: Negative for speech difficulty and headaches.     Physical Exam Updated Vital Signs BP 119/65   Pulse 91   Temp (!) 100.7 F (38.2 C) (Oral)   Resp (!) 28   Ht 5\' 11"  (1.803 m)   Wt 132.5 kg (292 lb)   SpO2 100%   BMI 40.73 kg/m   Physical Exam  Constitutional: He appears well-developed and well-nourished.  HENT:  Head: Normocephalic and atraumatic.  Eyes: Conjunctivae are normal. Left eye exhibits chemosis and discharge.  Neck: Normal range of motion. Neck supple.  Cardiovascular: Normal rate, regular rhythm, normal heart sounds and intact distal pulses.  No murmur heard. Pulmonary/Chest: Accessory muscle usage present. Tachypnea noted. No respiratory distress. He has wheezes. He has rhonchi.  Abdominal: Soft. There is no tenderness.  Musculoskeletal: Normal range of motion.       Right lower leg: He exhibits edema.       Left lower leg: He exhibits edema.  Neurological: He is alert.  Skin: Skin is warm and dry. Capillary refill takes less than 2 seconds.  Large left groin hematoma extending onto the base of the penis.  Psychiatric: He has a normal mood and affect.  Nursing note and vitals reviewed.    ED Treatments / Results  Labs (all labs ordered are listed, but only abnormal results are displayed) Labs Reviewed  CULTURE, BLOOD (ROUTINE X 2)  CULTURE, BLOOD (ROUTINE X 2)  BASIC METABOLIC PANEL  CBC WITH DIFFERENTIAL/PLATELET  BRAIN NATRIURETIC PEPTIDE  I-STAT TROPONIN, ED  I-STAT CG4 LACTIC ACID, ED    EKG EKG  Interpretation  Date/Time:  Wednesday February 02 2018 17:06:48 EDT Ventricular Rate:  91 PR Interval:    QRS  Duration: 108 QT Interval:  375 QTC Calculation: 462 R Axis:   -26 Text Interpretation:  Sinus rhythm Prolonged PR interval Borderline left axis deviation Low voltage, precordial leads similar to prior 8/19 Confirmed by Aletta Edouard [phone]) on 02/02/2018 5:31:21 PM   Radiology Dg Chest Port 1 View  Result Date: 02/02/2018 CLINICAL DATA:  Shortness of breath and RIGHT chest pain. Recent atrial fibrillation. EXAM: PORTABLE CHEST 1 VIEW COMPARISON:  Chest radiograph January 31, 2018 FINDINGS: Similar cardiomegaly. Calcified aortic arch. Pulmonary vasculature congestion and interstitial prominence with RIGHT mid lung zone consolidation. Small bilateral pleural effusions. No pneumothorax. No pneumothorax. Soft tissue planes and included osseous structures are non suspicious. IMPRESSION: 1. New RIGHT mid lung zone consolidation concerning for pneumonia versus confluent edema. 2. Stable cardiomegaly and interstitial edema with small pleural effusions. Electronically Signed   By: Elon Alas M.D.   On: 02/02/2018 17:58    Procedures Procedures (including critical care time)  Medications Ordered in ED Medications  ceFEPIme (MAXIPIME) 1 g in sodium chloride 0.9 % 100 mL IVPB (has no administration in time range)  morphine 4 MG/ML injection 4 mg (has no administration in time range)     Initial Impression / Assessment and Plan / ED Course  I have reviewed the triage vital signs and the nursing notes.  Pertinent labs & imaging results that were available during my care of the patient were reviewed by me and considered in my medical decision making (see chart for details).  Clinical Course as of Feb 04 931  Wed Feb 02, 2018  1747 Place patient on BiPAP as he is looking for other uncomfortable.  Is also been repositioned and given some morphine for his pain in his back and his chest.     [MB]  1841 Discussed with Dr. Martinique from cardiology.  He feels in the setting of pneumonia he would recommend the patient be admitted to medical service and cardiology will consult.  As far as the troponin being elevated he is not sure that that could be adequately explained by the ablation.   [MB]  2114 Discussed with Dr. Cyndia Diver Hospitalist for admission,  who asked if I would do a bedside ultrasound to evaluate for pericardial effusion.  I did a bedside ultrasound and it does look like he has a moderate effusion.  I have paged cardiology to review these findings.   [MB]  2123 Discussed with Dr. Hassell Done from cardiology.  She is asking to have the echo tech called in to do a formal study.  Our Network engineer in the ED is placing a call.   [MB]  2155 Patient was signed out to the oncoming team including Dr. Darl Householder.   [MB]    Clinical Course User Index [MB] Hayden Rasmussen, MD      Final Clinical Impressions(s) / ED Diagnoses   Final diagnoses:  Pneumonia of right middle lobe due to infectious organism [facility])  Chest pain, unspecified type  Pericardial effusion    ED Discharge Orders    None       Hayden Rasmussen, MD 02/03/18 [phone]

## 2018-02-02 NOTE — Telephone Encounter (Signed)
Pt transferred to this nurse in triage.  Per Pt he had a recent afib ablation and now is having shortness of breath and right sided chest pain.  Pt is audibly short of breath.  Pt has appointment with afib clinic tomorrow 02/03/2018.   Advised Pt if he felt like he needed to be assessed before his appt tomorrow he should either call 911 or go to the ER.  Pt states that "will need to make that decision".

## 2018-02-02 NOTE — ED Notes (Signed)
IV team at bedside 

## 2018-02-02 NOTE — ED Notes (Signed)
Requested phlebotomy for second set of cultures.

## 2018-02-02 NOTE — ED Notes (Signed)
Pts wife is leaving for the night.  Her phone number is [phone].

## 2018-02-02 NOTE — ED Notes (Signed)
ECHO tech at the bedside.

## 2018-02-03 ENCOUNTER — Inpatient Hospital Stay (HOSPITAL_COMMUNITY): Payer: Medicare Other

## 2018-02-03 ENCOUNTER — Encounter (HOSPITAL_COMMUNITY): Payer: Self-pay | Admitting: Internal Medicine

## 2018-02-03 ENCOUNTER — Ambulatory Visit (HOSPITAL_COMMUNITY): Payer: Medicare Other | Admitting: Nurse Practitioner

## 2018-02-03 ENCOUNTER — Other Ambulatory Visit: Payer: Self-pay

## 2018-02-03 DIAGNOSIS — I480 Paroxysmal atrial fibrillation: Secondary | ICD-10-CM

## 2018-02-03 DIAGNOSIS — I3139 Other pericardial effusion (noninflammatory): Secondary | ICD-10-CM

## 2018-02-03 DIAGNOSIS — J189 Pneumonia, unspecified organism: Secondary | ICD-10-CM | POA: Diagnosis present

## 2018-02-03 DIAGNOSIS — R7989 Other specified abnormal findings of blood chemistry: Secondary | ICD-10-CM

## 2018-02-03 DIAGNOSIS — I10 Essential (primary) hypertension: Secondary | ICD-10-CM

## 2018-02-03 DIAGNOSIS — R748 Abnormal levels of other serum enzymes: Secondary | ICD-10-CM

## 2018-02-03 DIAGNOSIS — R778 Other specified abnormalities of plasma proteins: Secondary | ICD-10-CM

## 2018-02-03 DIAGNOSIS — I313 Pericardial effusion (noninflammatory): Secondary | ICD-10-CM

## 2018-02-03 DIAGNOSIS — R0602 Shortness of breath: Secondary | ICD-10-CM

## 2018-02-03 HISTORY — DX: Pneumonia, unspecified organism: J18.9

## 2018-02-03 LAB — BASIC METABOLIC PANEL
Anion gap: 14 (ref 5–15)
BUN: 16 mg/dL (ref 8–23)
CALCIUM: 8.7 mg/dL — AB (ref 8.9–10.3)
CO2: 22 mmol/L (ref 22–32)
Chloride: 96 mmol/L — ABNORMAL LOW (ref 98–111)
Creatinine, Ser: 1.64 mg/dL — ABNORMAL HIGH (ref 0.61–1.24)
GFR calc Af Amer: 46 mL/min — ABNORMAL LOW (ref >60)
GFR, EST NON AFRICAN AMERICAN: 39 mL/min — AB (ref >60)
Glucose, Bld: 135 mg/dL — ABNORMAL HIGH (ref 70–99)
Potassium: 4.1 mmol/L (ref 3.5–5.1)
Sodium: 132 mmol/L — ABNORMAL LOW (ref 135–145)

## 2018-02-03 LAB — CBC
HCT: 41.3 % (ref 39.0–52.0)
Hemoglobin: 13.2 g/dL (ref 13.0–17.0)
MCH: 29.5 pg (ref 26.0–34.0)
MCHC: 32 g/dL (ref 30.0–36.0)
MCV: 92.4 fL (ref 78.0–100.0)
Platelets: 338 10*3/uL (ref 150–400)
RBC: 4.47 MIL/uL (ref 4.22–5.81)
RDW: 14.6 % (ref 11.5–15.5)
WBC: 14.1 10*3/uL — ABNORMAL HIGH (ref 4.0–10.5)

## 2018-02-03 LAB — PROTEIN, URINE, RANDOM: Total Protein, Urine: 17 mg/dL

## 2018-02-03 LAB — PROTIME-INR
INR: 1.2
Prothrombin Time: 15.1 seconds (ref 11.4–15.2)

## 2018-02-03 LAB — EXPECTORATED SPUTUM ASSESSMENT W REFEX TO RESP CULTURE

## 2018-02-03 LAB — URINALYSIS, ROUTINE W REFLEX MICROSCOPIC
BILIRUBIN URINE: NEGATIVE
GLUCOSE, UA: NEGATIVE mg/dL
Hgb urine dipstick: NEGATIVE
Ketones, ur: NEGATIVE mg/dL
Leukocytes, UA: NEGATIVE
Nitrite: NEGATIVE
PH: 5 (ref 5.0–8.0)
Protein, ur: NEGATIVE mg/dL
Specific Gravity, Urine: 1.016 (ref 1.005–1.030)

## 2018-02-03 LAB — HEPARIN LEVEL (UNFRACTIONATED)
Heparin Unfractionated: <0.1 IU/mL — ABNORMAL LOW (ref 0.30–0.70)
Heparin Unfractionated: 0.11 IU/mL — ABNORMAL LOW (ref 0.30–0.70)

## 2018-02-03 LAB — APTT: aPTT: 31 seconds (ref 24–36)

## 2018-02-03 LAB — BLOOD GAS, ARTERIAL
ACID-BASE DEFICIT: 0.7 mmol/L (ref 0.0–2.0)
BICARBONATE: 23.3 mmol/L (ref 20.0–28.0)
DRAWN BY: 249101
Delivery systems: POSITIVE
Expiratory PAP: 7
FIO2: 40
Inspiratory PAP: 14
O2 Saturation: 94.3 %
PCO2 ART: 38.1 mmHg (ref 32.0–48.0)
PO2 ART: 72 mmHg — AB (ref 83.0–108.0)
Patient temperature: 98.6
pH, Arterial: 7.405 (ref 7.350–7.450)

## 2018-02-03 LAB — LACTIC ACID, PLASMA
Lactic Acid, Venous: 1.2 mmol/L (ref 0.5–1.9)
Lactic Acid, Venous: 1.9 mmol/L (ref 0.5–1.9)

## 2018-02-03 LAB — GLUCOSE, CAPILLARY: GLUCOSE-CAPILLARY: 85 mg/dL (ref 70–99)

## 2018-02-03 LAB — ECHOCARDIOGRAM LIMITED
HEIGHTINCHES: 71 in
WEIGHTICAEL: 4672 [oz_av]

## 2018-02-03 LAB — PROCALCITONIN: Procalcitonin: 0.63 ng/mL

## 2018-02-03 LAB — MRSA PCR SCREENING: MRSA by PCR: NEGATIVE

## 2018-02-03 LAB — TROPONIN I
TROPONIN I: 0.03 ng/mL — AB (ref <0.03)
Troponin I: 0.03 ng/mL (ref <0.03)
Troponin I: <0.03 ng/mL (ref <0.03)

## 2018-02-03 LAB — STREP PNEUMONIAE URINARY ANTIGEN: STREP PNEUMO URINARY ANTIGEN: NEGATIVE

## 2018-02-03 LAB — EXPECTORATED SPUTUM ASSESSMENT W GRAM STAIN, RFLX TO RESP C

## 2018-02-03 LAB — SODIUM, URINE, RANDOM: Sodium, Ur: <10 mmol/L

## 2018-02-03 LAB — D-DIMER, QUANTITATIVE: D-Dimer, Quant: 1.58 ug/mL-FEU — ABNORMAL HIGH (ref 0.00–0.50)

## 2018-02-03 LAB — CREATININE, URINE, RANDOM: Creatinine, Urine: 128.07 mg/dL

## 2018-02-03 MED ORDER — ALLOPURINOL 300 MG PO TABS
150.0000 mg | ORAL_TABLET | Freq: Every day | ORAL | Status: DC
  Administered 2018-02-03 – 2018-02-12 (×10): 150 mg via ORAL
  Filled 2018-02-03 (×11): qty 1

## 2018-02-03 MED ORDER — AMIODARONE HCL 200 MG PO TABS
200.0000 mg | ORAL_TABLET | Freq: Every day | ORAL | Status: DC
  Administered 2018-02-03 – 2018-02-12 (×10): 200 mg via ORAL
  Filled 2018-02-03 (×10): qty 1

## 2018-02-03 MED ORDER — TECHNETIUM TC 99M DIETHYLENETRIAME-PENTAACETIC ACID
30.0000 | Freq: Once | INTRAVENOUS | Status: AC | PRN
  Administered 2018-02-03: 30 via RESPIRATORY_TRACT

## 2018-02-03 MED ORDER — ACETAMINOPHEN 325 MG PO TABS
650.0000 mg | ORAL_TABLET | Freq: Four times a day (QID) | ORAL | Status: DC | PRN
  Administered 2018-02-03 – 2018-02-06 (×4): 650 mg via ORAL
  Filled 2018-02-03 (×4): qty 2

## 2018-02-03 MED ORDER — ORAL CARE MOUTH RINSE
15.0000 mL | Freq: Two times a day (BID) | OROMUCOSAL | Status: DC
  Administered 2018-02-03 – 2018-02-12 (×18): 15 mL via OROMUCOSAL

## 2018-02-03 MED ORDER — ONDANSETRON HCL 4 MG PO TABS
4.0000 mg | ORAL_TABLET | Freq: Four times a day (QID) | ORAL | Status: DC | PRN
  Administered 2018-02-12: 4 mg via ORAL
  Filled 2018-02-03: qty 1

## 2018-02-03 MED ORDER — ZOLPIDEM TARTRATE 5 MG PO TABS
5.0000 mg | ORAL_TABLET | Freq: Every evening | ORAL | Status: DC | PRN
  Administered 2018-02-03 – 2018-02-07 (×5): 5 mg via ORAL
  Filled 2018-02-03 (×5): qty 1

## 2018-02-03 MED ORDER — DILTIAZEM HCL ER COATED BEADS 240 MG PO CP24
240.0000 mg | ORAL_CAPSULE | Freq: Every day | ORAL | Status: DC
  Administered 2018-02-03 – 2018-02-12 (×10): 240 mg via ORAL
  Filled 2018-02-03 (×10): qty 1

## 2018-02-03 MED ORDER — ACETAMINOPHEN 650 MG RE SUPP: 650.0000 mg | Freq: Four times a day (QID) | RECTAL | Status: DC | PRN

## 2018-02-03 MED ORDER — ADULT MULTIVITAMIN W/MINERALS CH
1.0000 | ORAL_TABLET | Freq: Every day | ORAL | Status: DC
  Administered 2018-02-03 – 2018-02-12 (×10): 1 via ORAL
  Filled 2018-02-03 (×10): qty 1

## 2018-02-03 MED ORDER — FUROSEMIDE 10 MG/ML IJ SOLN
40.0000 mg | Freq: Once | INTRAMUSCULAR | Status: AC
  Administered 2018-02-03: 40 mg via INTRAVENOUS
  Filled 2018-02-03: qty 4

## 2018-02-03 MED ORDER — ALPRAZOLAM 0.25 MG PO TABS
0.2500 mg | ORAL_TABLET | Freq: Three times a day (TID) | ORAL | Status: DC | PRN
  Administered 2018-02-03 – 2018-02-12 (×11): 0.25 mg via ORAL
  Filled 2018-02-03 (×11): qty 1

## 2018-02-03 MED ORDER — COLCHICINE 0.6 MG PO TABS
0.6000 mg | ORAL_TABLET | Freq: Every day | ORAL | Status: DC
  Administered 2018-02-03 – 2018-02-13 (×11): 0.6 mg via ORAL
  Filled 2018-02-03 (×11): qty 1

## 2018-02-03 MED ORDER — TECHNETIUM TO 99M ALBUMIN AGGREGATED
4.0000 | Freq: Once | INTRAVENOUS | Status: AC | PRN
  Administered 2018-02-03: 4 via INTRAVENOUS

## 2018-02-03 MED ORDER — HEPARIN (PORCINE) IN NACL 100-0.45 UNIT/ML-% IJ SOLN
2700.0000 [IU]/h | INTRAMUSCULAR | Status: DC
Start: 2018-02-03 — End: 2018-02-05
  Administered 2018-02-03: 1750 [IU]/h via INTRAVENOUS
  Administered 2018-02-03: 1500 [IU]/h via INTRAVENOUS
  Administered 2018-02-05: 2700 [IU]/h via INTRAVENOUS
  Filled 2018-02-03 (×5): qty 250

## 2018-02-03 MED ORDER — METOPROLOL TARTRATE 12.5 MG HALF TABLET
12.5000 mg | ORAL_TABLET | Freq: Two times a day (BID) | ORAL | Status: DC
  Administered 2018-02-03 – 2018-02-12 (×19): 12.5 mg via ORAL
  Filled 2018-02-03 (×19): qty 1

## 2018-02-03 MED ORDER — FUROSEMIDE 20 MG PO TABS
20.0000 mg | ORAL_TABLET | Freq: Every day | ORAL | Status: DC
  Administered 2018-02-03: 20 mg via ORAL
  Filled 2018-02-03: qty 1

## 2018-02-03 MED ORDER — ACETAMINOPHEN 650 MG RE SUPP: 650.0000 mg | Freq: Once | RECTAL | Status: DC

## 2018-02-03 MED ORDER — NORTRIPTYLINE HCL 25 MG PO CAPS
75.0000 mg | ORAL_CAPSULE | Freq: Every day | ORAL | Status: DC
  Administered 2018-02-03 – 2018-02-11 (×9): 75 mg via ORAL
  Filled 2018-02-03 (×11): qty 3

## 2018-02-03 MED ORDER — ESCITALOPRAM OXALATE 20 MG PO TABS
20.0000 mg | ORAL_TABLET | Freq: Every day | ORAL | Status: DC
  Administered 2018-02-03 – 2018-02-12 (×10): 20 mg via ORAL
  Filled 2018-02-03 (×10): qty 1

## 2018-02-03 MED ORDER — MOMETASONE FURO-FORMOTEROL FUM 200-5 MCG/ACT IN AERO
2.0000 | INHALATION_SPRAY | Freq: Two times a day (BID) | RESPIRATORY_TRACT | Status: DC
  Administered 2018-02-03 – 2018-02-05 (×5): 2 via RESPIRATORY_TRACT
  Filled 2018-02-03 (×2): qty 8.8

## 2018-02-03 MED ORDER — NITROGLYCERIN 0.4 MG SL SUBL
0.4000 mg | SUBLINGUAL_TABLET | SUBLINGUAL | Status: DC | PRN
  Administered 2018-02-12 (×2): 0.4 mg via SUBLINGUAL
  Filled 2018-02-03 (×2): qty 1

## 2018-02-03 MED ORDER — LEVOTHYROXINE SODIUM 75 MCG PO TABS
150.0000 ug | ORAL_TABLET | Freq: Every day | ORAL | Status: DC
  Administered 2018-02-03 – 2018-02-12 (×10): 150 ug via ORAL
  Filled 2018-02-03 (×12): qty 2

## 2018-02-03 MED ORDER — ONDANSETRON HCL 4 MG/2ML IJ SOLN: 4.0000 mg | Freq: Four times a day (QID) | INTRAMUSCULAR | Status: DC | PRN

## 2018-02-03 MED ORDER — SODIUM CHLORIDE 0.9 % IV SOLN
1.0000 g | Freq: Three times a day (TID) | INTRAVENOUS | Status: AC
  Administered 2018-02-03 – 2018-02-10 (×24): 1 g via INTRAVENOUS
  Filled 2018-02-03 (×24): qty 1

## 2018-02-03 MED ORDER — APIXABAN 5 MG PO TABS
5.0000 mg | ORAL_TABLET | Freq: Two times a day (BID) | ORAL | Status: DC
  Filled 2018-02-03: qty 1

## 2018-02-03 MED ORDER — SUVOREXANT 10 MG PO TABS
10.0000 mg | ORAL_TABLET | Freq: Every day | ORAL | Status: DC
Start: 2018-02-03 — End: 2018-02-03

## 2018-02-03 NOTE — Progress Notes (Signed)
Pharmacy Antibiotic Note  Wayne Booth is a 77 y.o. male admitted on 02/02/2018 with pneumonia.  Pharmacy has been consulted for vancomycin dosing.  MRSA PCR came back neg. Ok to [employer] per Dr. Marthenia Rolling.  Plan: Dc vanc Continue Cefepime 1g IV q8   Height: 5\' 11"  (180.3 cm) Weight: 297 lb 2.9 oz (134.8 kg) IBW/kg (Calculated) : 75.3  Temp (24hrs), Avg:99.8 F (37.7 C), Min:98.1 F (36.7 C), Max:101.2 F (38.4 C)  Recent Labs  Lab 02/02/18 1727 02/02/18 1821 02/02/18 2018 02/03/18 0434 02/03/18 0902  WBC  --   --  16.6*  --  14.1*  CREATININE 1.35*  --   --   --  1.64*  LATICACIDVEN  --  1.51  --  1.2 1.9    Estimated Creatinine Clearance: 54.6 mL/min (A) (by C-G formula based on SCr of 1.64 mg/dL (H)).    No Known Allergies  Antimicrobials this admission: Vancomycin 8/7 >>8/8   Cefepime 8/7 >>   Dose adjustments this admission:   Microbiology results: BCx: pend UCx:  Sputum:  MRSA PCR: neg  [name], PharmD, Jake Samples, AAHIVP, CPP Infectious Disease Pharmacist Pager: [phone] 02/03/2018 11:26 AM

## 2018-02-03 NOTE — Progress Notes (Signed)
PROGRESS NOTE    GRAYSYN BACHE  FXT:[id number] DOB: 02/14/1941 DOA: 02/02/2018 PCP: Marin Olp, MD  Outpatient Specialists:     Brief Narrative:  Patient is a 77 year old male, morbidly obese, with past medical history significant for atrial fibrillation status post recent ablation, OSA but noncompliant with CPAP, hypothyroidism, history of pulmonary embolism, hypertension, anxiety, nephrolithiasis, documented renal and prostate cancer.  Patient is said to have undergone nephrectomy with me, and has one kidney.  Patient was admitted with shortness of breath, right-sided chest pain, fever, small pericardial effusion and worsening renal function.  Patient also reports being significantly anxious.  Chest x-ray done on presentation is worrisome for possible pneumonia.  Serum creatinine has gone from 1.16-1 0.29-1 0.25-1.64.  Urine output is decreased.  Patient reports poor p.o. intake.  Patient's diuretics was doubled within the last 1 to 2 weeks as the patient was said to have gained 14 pounds in weight.  Procalcitonin level is 0.63.  Leukocytosis of 14.1 (down from 16.6) is noted.  ABG done revealed pH of 7.405, PCO2 of 38.1 and PO2 of 72.  Cardiac BMP done on 02/02/2018 was 54.3, and troponin has been 0.03 on 2 occasions.  Check d-dimer, proceed with CT of the chest without contrast, work-up patient's acute kidney injury.   Assessment & Plan:   Active Problems:   History of renal cell carcinoma   Essential hypertension   OSA (obstructive sleep apnea)   Hypothyroidism   History of pulmonary embolism   Paroxysmal atrial fibrillation (HCC)   Acute respiratory failure with hypoxia (HCC)   HCAP (healthcare-associated pneumonia)   Possible pneumonia: Nasal swab for MRSA came back negative. Vancomycin has been discontinued. Continue cefepime for now. Review with results of CT of the chest without contrast. Follow-up pending work-up  Right-sided chest pain: Patient has prior history  of pulmonary embolism. Patient has history of prostate and renal cancer. Patient recently underwent ablation by the cardiology team for atrial fibrillation. [name] thinks that the Eliquis was held prior to the ablation. Based on above, therefore, patient will be at risk for thromboembolic phenomena/PE. We will check d-dimer If d-dimer is elevated, will proceed with VQ scan Continue with heparin drip for now.  Acute kidney injury: Possibly.  Renal. Will check urine sodium, urine protein and urine creatinine. We will check a renal ultrasound. IV fluids for now Hold Lasix Continue to monitor renal function and electrolytes For further work-up if the renal function continues to worsen. Low threshold to consult nephrology Further management will depend on hospital course.  Atrial fibrillation: Patient is currently anticoagulated. Heart rate is controlled. Continue management as per cardiology team.  Input from cardiology is highly appreciative.  Hypertension: Continue to optimize. Systolic blood pressure this morning was 106 mmHg.  Morbid obesity/OSA: Patient is not compliant with CPAP Patient counseled regarding need to comply with CPAP machine. Diet and exercise when feasible.  Anxiety: Patient is asking for some medication. Cautious use of Xanax as needed.   DVT prophylaxis: Heparin drip Code Status: Full Family Communication: Wife and son Disposition Plan: Home eventual   Consultants:   Cardiology  Procedures:   Echo  Antimicrobials:   IV cefepime  IV Vanco discontinued   Subjective: Patient continues to report right-sided chest pain Reports feeling anxious  Objective: Vitals:   02/03/18 0500 02/03/18 0600 02/03/18 0813 02/03/18 0833  BP:  123/79  (!) 150/80  Pulse: 87 88 89 80  Resp: (!) 28 (!) 31 (!) 26 20  Temp:    99.2 F (37.3 C)  TempSrc:    Oral  SpO2: 97% 95% 98% 96%  Weight: 134.8 kg     Height: 5\' 11"  (1.803 m)        Intake/Output Summary (Last 24 hours) at 02/03/2018 1219 Last data filed at 02/03/2018 0800 Gross per 24 hour  Intake 200.42 ml  Output -  Net 200.42 ml   Filed Weights   02/02/18 1724 02/03/18 0500  Weight: 132.5 kg 134.8 kg    Examination:  General exam: Morbidly obese.  Appears calm and comfortable  Respiratory system: Decreased air entry.  Bibasilar crackles posteriorly.   Cardiovascular system: S1 & S2 heard. Gastrointestinal system: Abdomen is morbidly obese, nontender.  Organs are difficult to assess.  Central nervous system: Alert and oriented. No focal neurological deficits. Extremities: Mild lower leg edema.       Data Reviewed: I have personally reviewed following labs and imaging studies  CBC: Recent Labs  Lab 02/02/18 2018 02/03/18 0902  WBC 16.6* 14.1*  NEUTROABS 12.4*  --   HGB 13.1 13.2  HCT 39.7 41.3  MCV 91.1 92.4  PLT 307 761   Basic Metabolic Panel: Recent Labs  Lab 02/02/18 1727 02/03/18 0902  NA 131* 132*  K 3.9 4.1  CL 96* 96*  CO2 25 22  GLUCOSE 97 135*  BUN 13 16  CREATININE 1.35* 1.64*  CALCIUM 8.4* 8.7*   GFR: Estimated Creatinine Clearance: 54.6 mL/min (A) (by C-G formula based on SCr of 1.64 mg/dL (H)). Liver Function Tests: No results for input(s): AST, ALT, ALKPHOS, BILITOT, PROT, ALBUMIN in the last 168 hours. No results for input(s): LIPASE, AMYLASE in the last 168 hours. No results for input(s): AMMONIA in the last 168 hours. Coagulation Profile: Recent Labs  Lab 02/03/18 0434  INR 1.20   Cardiac Enzymes: Recent Labs  Lab 02/03/18 0351 02/03/18 0902  TROPONINI 0.03* 0.03*   BNP (last 3 results) No results for input(s): PROBNP in the last 8760 hours. HbA1C: No results for input(s): HGBA1C in the last 72 hours. CBG: Recent Labs  Lab 02/02/18 1811  GLUCAP 85   Lipid Profile: No results for input(s): CHOL, HDL, LDLCALC, TRIG, CHOLHDL, LDLDIRECT in the last 72 hours. Thyroid Function Tests: No results  for input(s): TSH, T4TOTAL, FREET4, T3FREE, THYROIDAB in the last 72 hours. Anemia Panel: No results for input(s): VITAMINB12, FOLATE, FERRITIN, TIBC, IRON, RETICCTPCT in the last 72 hours. Urine analysis:    Component Value Date/Time   COLORURINE YELLOW 11/02/2017 [place] 11/02/2017 0347   LABSPEC 1.011 11/02/2017 0347   PHURINE 5.0 11/02/2017 0347   GLUCOSEU NEGATIVE 11/02/2017 0347   HGBUR NEGATIVE 11/02/2017 0347   HGBUR 2+ 09/22/2007 1444   BILIRUBINUR NEGATIVE 11/02/2017 0347   BILIRUBINUR n 11/07/2015 1417   KETONESUR NEGATIVE 11/02/2017 0347   PROTEINUR NEGATIVE 11/02/2017 0347   UROBILINOGEN 1.0 11/07/2015 1417   UROBILINOGEN 1.0 05/22/2011 1454   NITRITE NEGATIVE 11/02/2017 0347   LEUKOCYTESUR NEGATIVE 11/02/2017 0347   Sepsis Labs: @LABRCNTIP (procalcitonin:4,lacticidven:4)  ) Recent Results (from the past 240 hour(s))  MRSA PCR Screening     Status: None   Collection Time: 02/03/18  2:44 AM  Result Value Ref Range Status   MRSA by PCR NEGATIVE NEGATIVE Final    Comment:        The GeneXpert MRSA Assay (FDA approved for NASAL specimens only), is one component of a comprehensive MRSA colonization surveillance program. It is not intended to  diagnose MRSA infection nor to guide or monitor treatment for MRSA infections. Performed at [facility], [place] [address] [place], [place] [address]          Radiology Studies: Dg Chest Port 1 View  Result Date: 02/03/2018 CLINICAL DATA:  Shortness of breath EXAM: PORTABLE CHEST 1 VIEW COMPARISON:  02/02/2018 FINDINGS: Shallow lung inflation with unchanged cardiomegaly. Mild right mid lung opacities are unchanged. Small left pleural effusion with associated atelectasis. IMPRESSION: Unchanged chest radiograph with mild right midlung opacity and small left pleural effusion. Electronically Signed   By: Ulyses Jarred M.D.   On: 02/03/2018 04:16   Dg Chest Port 1 View  Result Date:  02/02/2018 CLINICAL DATA:  Shortness of breath and RIGHT chest pain. Recent atrial fibrillation. EXAM: PORTABLE CHEST 1 VIEW COMPARISON:  Chest radiograph January 31, 2018 FINDINGS: Similar cardiomegaly. Calcified aortic arch. Pulmonary vasculature congestion and interstitial prominence with RIGHT mid lung zone consolidation. Small bilateral pleural effusions. No pneumothorax. No pneumothorax. Soft tissue planes and included osseous structures are non suspicious. IMPRESSION: 1. New RIGHT mid lung zone consolidation concerning for pneumonia versus confluent edema. 2. Stable cardiomegaly and interstitial edema with small pleural effusions. Electronically Signed   By: Elon Alas M.D.   On: 02/02/2018 17:58        Scheduled Meds: . allopurinol  150 mg Oral Daily  . amiodarone  200 mg Oral Daily  . colchicine  0.6 mg Oral Daily  . diltiazem  240 mg Oral Daily  . escitalopram  20 mg Oral Daily  . levothyroxine  150 mcg Oral QAC breakfast  . mouth rinse  15 mL Mouth Rinse BID  . metoprolol tartrate  12.5 mg Oral BID  . mometasone-formoterol  2 puff Inhalation BID  . multivitamin with minerals  1 tablet Oral Daily  . nortriptyline  75 mg Oral QHS   Continuous Infusions: . ceFEPime (MAXIPIME) IV 1 g (02/03/18 0559)  . heparin 1,500 Units/hr (02/03/18 0623)     LOS: 1 day    Time spent: 36 minutes.    Dana Allan, MD  [facility] Pager #: [phone] 7PM-7AM contact night coverage as above

## 2018-02-03 NOTE — Progress Notes (Signed)
ANTICOAGULATION CONSULT NOTE - Initial Consult  Pharmacy Consult for Heparin Indication: atrial fibrillation  No Known Allergies  Patient Measurements: Height: 5\' 11"  (180.3 cm) Weight: 292 lb (132.5 kg) IBW/kg (Calculated) : 75.3 Heparin Dosing Weight: 105 kg  Vital Signs: Temp: 98.7 F (37.1 C) (08/08 0333) Temp Source: Oral (08/08 0333) BP: 128/78 (08/08 0355) Pulse Rate: 87 (08/08 0355)  Labs: Recent Labs    02/02/18 1727 02/02/18 2018 02/03/18 0351  HGB  --  13.1  --   HCT  --  39.7  --   PLT  --  307  --   CREATININE 1.35*  --   --   TROPONINI  --   --  0.03*    Estimated Creatinine Clearance: 65.7 mL/min (A) (by C-G formula based on SCr of 1.35 mg/dL (H)).   Medical History: Past Medical History:  Diagnosis Date  . Allergy    States his nose runs when he is around grease.   Marland Kitchen Anxiety   . Arthritis   . Atrial fibrillation ([place])   . Cancer ([place])   . Coronary artery disease   . History of total knee replacement   . Hypertension   . Hypothyroidism   . Kidney cancer, primary, with metastasis from kidney to other site [facility])   . Kidney stone   . OSA (obstructive sleep apnea)    pt does not wear cpap at night  . Pituitary cyst ([place])   . Pneumonia    hx of  . Prostate cancer ([place])     Medications:  Medications Prior to Admission  Medication Sig Dispense Refill Last Dose  . albuterol (PROVENTIL HFA;VENTOLIN HFA) 108 (90 Base) MCG/ACT inhaler Inhale 2 puffs into the lungs every 6 (six) hours as needed for wheezing or shortness of breath. 1 Inhaler 5 unk  . allopurinol (ZYLOPRIM) 300 MG tablet Take 0.5 tablets (150 mg total) by mouth daily. 30 tablet 3 02/02/2018 at 1200  . amiodarone (PACERONE) 200 MG tablet Take 1 tablet (200 mg total) by mouth daily. 90 tablet 3 02/02/2018 at 1200  . apixaban (ELIQUIS) 5 MG TABS tablet Take 1 tablet (5 mg total) by mouth 2 (two) times daily. 60 tablet 1 02/02/2018 at 1200  . budesonide-formoterol (SYMBICORT) 160-4.5 MCG/ACT  inhaler Inhale 2 puffs into the lungs 2 (two) times daily as needed (for respiratory flares).   unk  . clotrimazole-betamethasone (LOTRISONE) cream Apply 1 application topically 2 (two) times daily. Use for 10 days maximum. (Patient taking differently: Apply 1 application topically 2 (two) times daily as needed (for rashes). ) 45 g 1 unk  . colchicine 0.6 MG tablet Take 1 tablet (0.6 mg total) by mouth daily. 30 tablet 3 02/02/2018 at Unknown time  . cyclobenzaprine (FLEXERIL) 10 MG tablet Take 10 mg by mouth daily as needed for muscle spasms.    02/02/2018 at Unknown time  . diltiazem (CARDIZEM CD) 240 MG 24 hr capsule Take 1 capsule (240 mg total) by mouth daily. 30 capsule 3 02/02/2018 at Unknown time  . escitalopram (LEXAPRO) 20 MG tablet TAKE ONE TABLET BY MOUTH DAILY 90 tablet 3 02/02/2018 at Unknown time  . furosemide (LASIX) 20 MG tablet Take 1 tablet (20 mg total) by mouth daily. 30 tablet 3 02/02/2018 at Unknown time  . levothyroxine (SYNTHROID, LEVOTHROID) 150 MCG tablet TAKE ONE TABLET BY MOUTH DAILY 90 tablet 2 02/02/2018 at Unknown time  . metoprolol tartrate (LOPRESSOR) 25 MG tablet Take 0.5 tablets (12.5 mg total) by mouth 2 (  two) times daily. 60 tablet 3 02/02/2018 at 1200  . Multiple Vitamins-Minerals (MULTIVITAMINS THER. W/MINERALS) TABS Take 1 tablet by mouth daily.   02/02/2018 at Unknown time  . nitroGLYCERIN (NITROSTAT) 0.4 MG SL tablet Place 1 tablet (0.4 mg total) under the tongue every 5 (five) minutes as needed for chest pain. 30 tablet 12 02/02/2018 at Unknown time  . nortriptyline (PAMELOR) 75 MG capsule TAKE ONE CAPSULE BY MOUTH AT BEDTIME 90 capsule 1 02/01/2018 at Unknown time  . Suvorexant (BELSOMRA) 10 MG TABS Take 10 mg by mouth at bedtime. 30 tablet 5 02/01/2018 at Unknown time    Assessment: 77 y.o. male admitted with SOB/CHF, h/o Afib Eliquis on hold, for heparin.   Goal of Therapy:  APTT 66-102 sec Heparin level 0.3-0.7 units/ml Monitor platelets by anticoagulation protocol:  Yes   Plan:  Start heparin 1500 units/hr APTT and Heparin level in 8 hours  Caryl Pina 02/03/2018,5:24 AM

## 2018-02-03 NOTE — Progress Notes (Signed)
ANTICOAGULATION CONSULT NOTE - Initial Consult  Pharmacy Consult for Heparin Indication: atrial fibrillation/hx PE  No Known Allergies  Patient Measurements: Height: 5\' 11"  (180.3 cm) Weight: 297 lb 2.9 oz (134.8 kg) IBW/kg (Calculated) : 75.3 Heparin Dosing Weight: 105 kg  Vital Signs: Temp: 98 F (36.7 C) (08/08 1225) Temp Source: Oral (08/08 1225) BP: 106/62 (08/08 1225) Pulse Rate: 84 (08/08 1225)  Labs: Recent Labs    02/02/18 1727 02/02/18 2018 02/03/18 0351 02/03/18 0434 02/03/18 0902 02/03/18 1218  HGB  --  13.1  --   --  13.2  --   HCT  --  39.7  --   --  41.3  --   PLT  --  307  --   --  338  --   APTT  --   --   --  31  --   --   LABPROT  --   --   --  15.1  --   --   INR  --   --   --  1.20  --   --   HEPARINUNFRC  --   --   --  <0.10*  --  0.11*  CREATININE 1.35*  --   --   --  1.64*  --   TROPONINI  --   --  0.03*  --  0.03*  --     Estimated Creatinine Clearance: 54.6 mL/min (A) (by C-G formula based on SCr of 1.64 mg/dL (H)).   Medical History: Past Medical History:  Diagnosis Date  . Allergy    States his nose runs when he is around grease.   Marland Kitchen Anxiety   . Arthritis   . Atrial fibrillation ([place])   . Cancer ([place])   . Coronary artery disease   . History of total knee replacement   . Hypertension   . Hypothyroidism   . Kidney cancer, primary, with metastasis from kidney to other site [facility])   . Kidney stone   . OSA (obstructive sleep apnea)    pt does not wear cpap at night  . Pituitary cyst ([place])   . Pneumonia    hx of  . Prostate cancer ([place])     Medications:  Medications Prior to Admission  Medication Sig Dispense Refill Last Dose  . albuterol (PROVENTIL HFA;VENTOLIN HFA) 108 (90 Base) MCG/ACT inhaler Inhale 2 puffs into the lungs every 6 (six) hours as needed for wheezing or shortness of breath. 1 Inhaler 5 unk  . allopurinol (ZYLOPRIM) 300 MG tablet Take 0.5 tablets (150 mg total) by mouth daily. 30 tablet 3 02/02/2018 at 1200  .  amiodarone (PACERONE) 200 MG tablet Take 1 tablet (200 mg total) by mouth daily. 90 tablet 3 02/02/2018 at 1200  . apixaban (ELIQUIS) 5 MG TABS tablet Take 1 tablet (5 mg total) by mouth 2 (two) times daily. 60 tablet 1 02/02/2018 at 1200  . budesonide-formoterol (SYMBICORT) 160-4.5 MCG/ACT inhaler Inhale 2 puffs into the lungs 2 (two) times daily as needed (for respiratory flares).   unk  . clotrimazole-betamethasone (LOTRISONE) cream Apply 1 application topically 2 (two) times daily. Use for 10 days maximum. (Patient taking differently: Apply 1 application topically 2 (two) times daily as needed (for rashes). ) 45 g 1 unk  . colchicine 0.6 MG tablet Take 1 tablet (0.6 mg total) by mouth daily. 30 tablet 3 02/02/2018 at Unknown time  . cyclobenzaprine (FLEXERIL) 10 MG tablet Take 10 mg by mouth daily as needed for muscle spasms.  02/02/2018 at Unknown time  . diltiazem (CARDIZEM CD) 240 MG 24 hr capsule Take 1 capsule (240 mg total) by mouth daily. 30 capsule 3 02/02/2018 at Unknown time  . escitalopram (LEXAPRO) 20 MG tablet TAKE ONE TABLET BY MOUTH DAILY 90 tablet 3 02/02/2018 at Unknown time  . furosemide (LASIX) 20 MG tablet Take 1 tablet (20 mg total) by mouth daily. 30 tablet 3 02/02/2018 at Unknown time  . levothyroxine (SYNTHROID, LEVOTHROID) 150 MCG tablet TAKE ONE TABLET BY MOUTH DAILY 90 tablet 2 02/02/2018 at Unknown time  . metoprolol tartrate (LOPRESSOR) 25 MG tablet Take 0.5 tablets (12.5 mg total) by mouth 2 (two) times daily. 60 tablet 3 02/02/2018 at 1200  . Multiple Vitamins-Minerals (MULTIVITAMINS THER. W/MINERALS) TABS Take 1 tablet by mouth daily.   02/02/2018 at Unknown time  . nitroGLYCERIN (NITROSTAT) 0.4 MG SL tablet Place 1 tablet (0.4 mg total) under the tongue every 5 (five) minutes as needed for chest pain. 30 tablet 12 02/02/2018 at Unknown time  . nortriptyline (PAMELOR) 75 MG capsule TAKE ONE CAPSULE BY MOUTH AT BEDTIME 90 capsule 1 02/01/2018 at Unknown time  . Suvorexant (BELSOMRA) 10 MG  TABS Take 10 mg by mouth at bedtime. 30 tablet 5 02/01/2018 at Unknown time    Assessment: 77 y.o. male admitted with SOB/CHF, h/o Afib Eliquis on hold, for heparin. D-dimer was done today and came back elevated at 1.58. Plan is to do CT. Heparin bridging came back subtherapeutic at 0.11. Baseline hep level was also low.    Goal of Therapy:  APTT 66-102 sec Heparin level 0.3-0.7 units/ml Monitor platelets by anticoagulation protocol: Yes   Plan:   Increase heparin to 1750 units/hr 6hr hep level  [name], PharmD, [place], AAHIVP, CPP Infectious Disease Pharmacist Pager: [phone] 02/03/2018 2:56 PM

## 2018-02-03 NOTE — H&P (Addendum)
History and Physical    Wayne Booth BJY:[id number] DOB: 1940/09/23 DOA: 02/02/2018  PCP: Marin Olp, MD  Patient coming from: Home.  Chief Complaint: Shortness of breath chest pain.  HPI: Wayne Booth is a 77 y.o. male with history of atrial fibrillation status post recent ablation by Dr. Curt Bears cardiologist, sleep apnea, hypothyroidism, history of PE, hypertension presents to the ER because of worsening shortness of breath with minimal exertion over the last 5 days since ambulation.  He has been having also right-sided pleuritic chest pain.  Denies any cough fever chills.  Denies any nausea vomiting or diarrhea.  ED Course: In the ER patient was found to be febrile with chest x-ray showing infiltrates.  Since ER physician found slight pericardial effusion cardiology was notified and had bedside echo as per the cardiology there is no significant pericardial effusion.  Patient was started on empiric antibiotics for pneumonia and admitted for same.  Patient was placed on BiPAP since patient was hypoxic.  Review of Systems: As per HPI, rest all negative.   Past Medical History:  Diagnosis Date  . Allergy    States his nose runs when he is around grease.   Marland Kitchen Anxiety   . Arthritis   . Atrial fibrillation ([place])   . Cancer ([place])   . Coronary artery disease   . History of total knee replacement   . Hypertension   . Hypothyroidism   . Kidney cancer, primary, with metastasis from kidney to other site [facility])   . Kidney stone   . OSA (obstructive sleep apnea)    pt does not wear cpap at night  . Pituitary cyst ([place])   . Pneumonia    hx of  . Prostate cancer [facility])     Past Surgical History:  Procedure Laterality Date  . ABLATION OF DYSRHYTHMIC FOCUS  01/28/2018  . ANTERIOR CERVICAL DECOMP/DISCECTOMY FUSION N/A 08/29/2012   Procedure: ANTERIOR CERVICAL DECOMPRESSION/DISCECTOMY FUSION 1 LEVEL;  Surgeon: Eustace Moore, MD;  Location: [place] NEURO ORS;  Service: Neurosurgery;   Laterality: N/A;  . APPENDECTOMY    . ATRIAL FIBRILLATION ABLATION N/A 01/28/2018   Procedure: ATRIAL FIBRILLATION ABLATION;  Surgeon: Constance Haw, MD;  Location: [place] CV LAB;  Service: Cardiovascular;  Laterality: N/A;  . CARDIAC CATHETERIZATION  2008   clean  . CHOLECYSTECTOMY    . COLONOSCOPY W/ POLYPECTOMY    . CRANIOTOMY N/A 11/08/2012   Procedure: CRANIOTOMY HYPOPHYSECTOMY TRANSNASAL APPROACH;  Surgeon: Faythe Ghee, MD;  Location: [place] NEURO ORS;  Service: Neurosurgery;  Laterality: N/A;  Transphenoidal Resection of Pituitary Tumor  . FINGER SURGERY Left 2017   Dr Amedeo Plenty  . INSERTION PROSTATE RADIATION SEED    . KIDNEY STONE SURGERY    . KNEE ARTHROSCOPY  2007   left  . NEPHRECTOMY Left   . POSTERIOR CERVICAL LAMINECTOMY Left 04/24/2015   Procedure: Foraminotomy cervical five - cervical six cervical six - cervical seven left;  Surgeon: Eustace Moore, MD;  Location: [facility] NEURO ORS;  Service: Neurosurgery;  Laterality: Left;  . REPLACEMENT TOTAL KNEE BILATERAL       reports that he has quit smoking. His smoking use included cigars. He quit after 4.00 years of use. He has never used smokeless tobacco. He reports that he drinks about 1.2 oz of alcohol per week. He reports that he does not use drugs.  No Known Allergies  Family History  Problem Relation Age of Onset  . Cancer Father        ?  mouth, died before patient born  . Cancer Mother        stomach, died when he was young  . Kidney cancer Sister   . Colon cancer Brother 57  . Esophageal cancer Neg Hx   . Rectal cancer Neg Hx   . Stomach cancer Neg Hx     Prior to Admission medications   Medication Sig Start Date End Date Taking? Authorizing Provider  albuterol (PROVENTIL HFA;VENTOLIN HFA) 108 (90 Base) MCG/ACT inhaler Inhale 2 puffs into the lungs every 6 (six) hours as needed for wheezing or shortness of breath. 08/11/17  Yes Rigoberto Noel, MD  allopurinol (ZYLOPRIM) 300 MG tablet Take 0.5 tablets (150 mg  total) by mouth daily. 12/07/17  Yes Rai, Ripudeep K, MD  amiodarone (PACERONE) 200 MG tablet Take 1 tablet (200 mg total) by mouth daily. 01/28/18  Yes Camnitz, Ocie Doyne, MD  apixaban (ELIQUIS) 5 MG TABS tablet Take 1 tablet (5 mg total) by mouth 2 (two) times daily. 11/03/17  Yes Georgette Shell, MD  budesonide-formoterol [facility]) 160-4.5 MCG/ACT inhaler Inhale 2 puffs into the lungs 2 (two) times daily as needed (for respiratory flares).   Yes [provider]  clotrimazole-betamethasone (LOTRISONE) cream Apply 1 application topically 2 (two) times daily. Use for 10 days maximum. Patient taking differently: Apply 1 application topically 2 (two) times daily as needed (for rashes).  08/27/16  Yes Marin Olp, MD  colchicine 0.6 MG tablet Take 1 tablet (0.6 mg total) by mouth daily. 12/07/17  Yes Rai, Ripudeep K, MD  cyclobenzaprine (FLEXERIL) 10 MG tablet Take 10 mg by mouth daily as needed for muscle spasms.  09/30/17  Yes [provider]  diltiazem (CARDIZEM CD) 240 MG 24 hr capsule Take 1 capsule (240 mg total) by mouth daily. 12/07/17  Yes Rai, Ripudeep K, MD  escitalopram (LEXAPRO) 20 MG tablet TAKE ONE TABLET BY MOUTH DAILY 01/27/18  Yes Marin Olp, MD  furosemide (LASIX) 20 MG tablet Take 1 tablet (20 mg total) by mouth daily. 12/07/17  Yes Rai, Ripudeep K, MD  levothyroxine (SYNTHROID, LEVOTHROID) 150 MCG tablet TAKE ONE TABLET BY MOUTH DAILY 07/06/17  Yes Marin Olp, MD  metoprolol tartrate (LOPRESSOR) 25 MG tablet Take 0.5 tablets (12.5 mg total) by mouth 2 (two) times daily. 12/07/17  Yes Rai, Vernelle Emerald, MD  Multiple Vitamins-Minerals (MULTIVITAMINS THER. W/MINERALS) TABS Take 1 tablet by mouth daily.   Yes [provider]  nitroGLYCERIN (NITROSTAT) 0.4 MG SL tablet Place 1 tablet (0.4 mg total) under the tongue every 5 (five) minutes as needed for chest pain. 12/07/17  Yes Rai, Ripudeep K, MD  nortriptyline (PAMELOR) 75 MG capsule TAKE ONE CAPSULE  BY MOUTH AT BEDTIME 12/31/17  Yes Marin Olp, MD  Suvorexant (BELSOMRA) 10 MG TABS Take 10 mg by mouth at bedtime. 12/27/17  Yes Marin Olp, MD    Physical Exam: Vitals:   02/02/18 2159 02/02/18 2200 02/02/18 2325 02/02/18 2330  BP:  117/69 132/72 129/81  Pulse:  93 96 95  Resp:  (!) 30 (!) 34 (!) 30  Temp: (!) 101 F (38.3 C)     TempSrc: Axillary     SpO2:  95% 94% 94%  Weight:      Height:          Constitutional: Moderately built and nourished. Vitals:   02/02/18 2159 02/02/18 2200 02/02/18 2325 02/02/18 2330  BP:  117/69 132/72 129/81  Pulse:  93 96 95  Resp:  (!) 30 (!) 34 (!) 30  Temp: (!) 101 F (38.3 C)     TempSrc: Axillary     SpO2:  95% 94% 94%  Weight:      Height:       Eyes: Anicteric no pallor. ENMT: No discharge from the ears eyes nose or mouth Neck: No mass palpated no neck rigidity. Respiratory: No rhonchi or crepitations. Cardiovascular: S1-S2 heard no murmurs appreciated. Abdomen: Soft nontender bowel sounds present. Musculoskeletal: No edema.  Patient. Skin: Ecchymotic area left thigh area where patient had the procedure done. Neurologic: Alert awake oriented to time place and person.  Moves all extremities. Psychiatric: Appears normal per normal affect.   Labs on Admission: I have personally reviewed following labs and imaging studies  CBC: Recent Labs  Lab 02/02/18 2018  WBC 16.6*  NEUTROABS 12.4*  HGB 13.1  HCT 39.7  MCV 91.1  PLT 497   Basic Metabolic Panel: Recent Labs  Lab 02/02/18 1727  NA 131*  K 3.9  CL 96*  CO2 25  GLUCOSE 97  BUN 13  CREATININE 1.35*  CALCIUM 8.4*   GFR: Estimated Creatinine Clearance: 65.7 mL/min (A) (by C-G formula based on SCr of 1.35 mg/dL (H)). Liver Function Tests: No results for input(s): AST, ALT, ALKPHOS, BILITOT, PROT, ALBUMIN in the last 168 hours. No results for input(s): LIPASE, AMYLASE in the last 168 hours. No results for input(s): AMMONIA in the last 168  hours. Coagulation Profile: No results for input(s): INR, PROTIME in the last 168 hours. Cardiac Enzymes: No results for input(s): CKTOTAL, CKMB, CKMBINDEX, TROPONINI in the last 168 hours. BNP (last 3 results) No results for input(s): PROBNP in the last 8760 hours. HbA1C: No results for input(s): HGBA1C in the last 72 hours. CBG: No results for input(s): GLUCAP in the last 168 hours. Lipid Profile: No results for input(s): CHOL, HDL, LDLCALC, TRIG, CHOLHDL, LDLDIRECT in the last 72 hours. Thyroid Function Tests: No results for input(s): TSH, T4TOTAL, FREET4, T3FREE, THYROIDAB in the last 72 hours. Anemia Panel: No results for input(s): VITAMINB12, FOLATE, FERRITIN, TIBC, IRON, RETICCTPCT in the last 72 hours. Urine analysis:    Component Value Date/Time   COLORURINE YELLOW 11/02/2017 [place] 11/02/2017 0347   LABSPEC 1.011 11/02/2017 0347   PHURINE 5.0 11/02/2017 0347   GLUCOSEU NEGATIVE 11/02/2017 0347   HGBUR NEGATIVE 11/02/2017 0347   HGBUR 2+ 09/22/2007 1444   BILIRUBINUR NEGATIVE 11/02/2017 0347   BILIRUBINUR n 11/07/2015 1417   KETONESUR NEGATIVE 11/02/2017 0347   PROTEINUR NEGATIVE 11/02/2017 0347   UROBILINOGEN 1.0 11/07/2015 1417   UROBILINOGEN 1.0 05/22/2011 1454   NITRITE NEGATIVE 11/02/2017 0347   LEUKOCYTESUR NEGATIVE 11/02/2017 0347   Sepsis Labs: @LABRCNTIP (procalcitonin:4,lacticidven:4) )No results found for this or any previous visit (from the past 240 hour(s)).   Radiological Exams on Admission: Dg Chest Port 1 View  Result Date: 02/02/2018 CLINICAL DATA:  Shortness of breath and RIGHT chest pain. Recent atrial fibrillation. EXAM: PORTABLE CHEST 1 VIEW COMPARISON:  Chest radiograph January 31, 2018 FINDINGS: Similar cardiomegaly. Calcified aortic arch. Pulmonary vasculature congestion and interstitial prominence with RIGHT mid lung zone consolidation. Small bilateral pleural effusions. No pneumothorax. No pneumothorax. Soft tissue planes  and included osseous structures are non suspicious. IMPRESSION: 1. New RIGHT mid lung zone consolidation concerning for pneumonia versus confluent edema. 2. Stable cardiomegaly and interstitial edema with small pleural effusions. Electronically Signed   By: Elon Alas M.D.   On: 02/02/2018 17:58  EKG: Independently reviewed.  Normal sinus rhythm with nonspecific ST-T changes.  Assessment/Plan Active Problems:   History of renal cell carcinoma   Essential hypertension   OSA (obstructive sleep apnea)   Hypothyroidism   History of pulmonary embolism   Paroxysmal atrial fibrillation (HCC)   Acute respiratory failure with hypoxia (HCC)   HCAP (healthcare-associated pneumonia)    1. Acute respiratory failure with hypoxia likely secondary to pneumonia.  Patient on empiric antibiotics.  We will cycle cardiac markers check d-dimer and since patient is on BiPAP back Patient on heparin instead of apixaban.  Cardiology has done a bedside echo which as per the cardiology did not show any significant pericardial effusion.  No definite evidence of CHF.  If shortness of breath does not improve we will consider VQ scan or CT chest. 2. Recent ablation for atrial fibrillation -patient is on metoprolol Cardizem and amiodarone.  For anticoagulation patient is on heparin since patient is on BiPAP.  If patient continues to remain on BiPAP patient's oral medication has to be changed to IV. 3. History of sleep apnea presently on BiPAP. 4. Hypothyroidism on Synthroid. 5. History of PE presently on anticoagulation.   DVT prophylaxis: Heparin. Code Status: Full code. Family Communication: Discussed with patient. Disposition Plan: Home. Consults called: Cardiology. Admission status: Observation.   Rise Patience MD [facility] Pager [phone].  If 7PM-7AM, please contact night-coverage [email or web address] Password [email or web address]  02/03/2018, 12:08 AM

## 2018-02-03 NOTE — Consult Note (Addendum)
Admit date: 02/02/2018 Referring Physician  Dr. Marthenia Rolling Primary Physician  Dr. [name] Primary Cardiologist  Dr. Casandra Doffing Reason for Consultation  SOB  HPI: Wayne Booth is a 77 y.o. male who is being seen today for the evaluation of SOB at the request of Dr. Marthenia Rolling  This is a 77 year old patient with a history of paroxysmal atrial fibrillation/atrial flutter hypertension, obstructive sleep apnea intolerant to CPAP and hypothyroidism.  Patient had a left heart catheterization done in 2008 which showed normal coronary arteries.  He had surgery for his hand a few weeks later after cath and was diagnosed with a PE and placed on Xarelto.  He was subsequently diagnosed with atrial flutter in May 2019 and was hospitalized and converted to sinus rhythm and discharged on Eliquis.  He was readmitted in June with recurrent atrial fibrillation with RVR.    He was seen by Dr. Curt Bears 01/28/2018 for atrial flutter ablation which was successful and he was discharged home on 01/29/2018.  Was seen back in the A. fib clinic on 01/31/2018 secondary to recurrent shortness of breath as well as right-sided pleuritic chest pain after his ablation.  His weight was up 7 pounds.  O2 sats are 93% on room air in the office.  He was noted to have 2+ pitting edema in the office.  EKG showed maintenance of normal sinus rhythm.  His Lasix was increased to 40 mg daily.  He called the office yesterday because of ongoing shortness of breath and chest pain that was occurring both with exertion and at rest.  He was instructed to go to the emergency room for further assessment.  On arrival emergency room he was found to be febrile and a chest x-ray showed new right midlung opacity and small left pleural effusion concerning for pneumonia versus confluent edema.  There was also interstitial edema noted.  WBC is elevated at 14.1.  Creatinine increased from baseline of 1.16 > 1.64 today.  Dimer was elevated at 1.58.  2D  echocardiogram done today showed trivial pericardial effusion with no Tamponade.  The scan has been done but results are pending.     PMH:   Past Medical History:  Diagnosis Date  . Allergy    States his nose runs when he is around grease.   Marland Kitchen Anxiety   . Arthritis   . Atrial fibrillation ([place])   . Cancer ([place])   . Coronary artery disease   . History of total knee replacement   . Hypertension   . Hypothyroidism   . Kidney cancer, primary, with metastasis from kidney to other site [facility])   . Kidney stone   . OSA (obstructive sleep apnea)    pt does not wear cpap at night  . Pituitary cyst ([place])   . Pneumonia    hx of  . Prostate cancer ([place])      PSH:   Past Surgical History:  Procedure Laterality Date  . ABLATION OF DYSRHYTHMIC FOCUS  01/28/2018  . ANTERIOR CERVICAL DECOMP/DISCECTOMY FUSION N/A 08/29/2012   Procedure: ANTERIOR CERVICAL DECOMPRESSION/DISCECTOMY FUSION 1 LEVEL;  Surgeon: Eustace Moore, MD;  Location: [place] [facility];  Service: Neurosurgery;  Laterality: N/A;  . APPENDECTOMY    . ATRIAL FIBRILLATION ABLATION N/A 01/28/2018   Procedure: ATRIAL FIBRILLATION ABLATION;  Surgeon: Constance Haw, MD;  Location: [place] CV LAB;  Service: Cardiovascular;  Laterality: N/A;  . CARDIAC CATHETERIZATION  2008   clean  . CHOLECYSTECTOMY    .  COLONOSCOPY W/ POLYPECTOMY    . CRANIOTOMY N/A 11/08/2012   Procedure: CRANIOTOMY HYPOPHYSECTOMY TRANSNASAL APPROACH;  Surgeon: Faythe Ghee, MD;  Location: [place] [facility];  Service: Neurosurgery;  Laterality: N/A;  Transphenoidal Resection of Pituitary Tumor  . FINGER SURGERY Left 2017   Dr Amedeo Plenty  . INSERTION PROSTATE RADIATION SEED    . KIDNEY STONE SURGERY    . KNEE ARTHROSCOPY  2007   left  . NEPHRECTOMY Left   . POSTERIOR CERVICAL LAMINECTOMY Left 04/24/2015   Procedure: Foraminotomy cervical five - cervical six cervical six - cervical seven left;  Surgeon: Eustace Moore, MD;  Location: [facility];  Service:  Neurosurgery;  Laterality: Left;  . REPLACEMENT TOTAL KNEE BILATERAL      Allergies:  Patient has no known allergies. Prior to Admit Meds:   Medications Prior to Admission  Medication Sig Dispense Refill Last Dose  . albuterol (PROVENTIL HFA;VENTOLIN HFA) 108 (90 Base) MCG/ACT inhaler Inhale 2 puffs into the lungs every 6 (six) hours as needed for wheezing or shortness of breath. 1 Inhaler 5 unk  . allopurinol (ZYLOPRIM) 300 MG tablet Take 0.5 tablets (150 mg total) by mouth daily. 30 tablet 3 02/02/2018 at 1200  . amiodarone (PACERONE) 200 MG tablet Take 1 tablet (200 mg total) by mouth daily. 90 tablet 3 02/02/2018 at 1200  . apixaban (ELIQUIS) 5 MG TABS tablet Take 1 tablet (5 mg total) by mouth 2 (two) times daily. 60 tablet 1 02/02/2018 at 1200  . budesonide-formoterol (SYMBICORT) 160-4.5 MCG/ACT inhaler Inhale 2 puffs into the lungs 2 (two) times daily as needed (for respiratory flares).   unk  . clotrimazole-betamethasone (LOTRISONE) cream Apply 1 application topically 2 (two) times daily. Use for 10 days maximum. (Patient taking differently: Apply 1 application topically 2 (two) times daily as needed (for rashes). ) 45 g 1 unk  . colchicine 0.6 MG tablet Take 1 tablet (0.6 mg total) by mouth daily. 30 tablet 3 02/02/2018 at Unknown time  . cyclobenzaprine (FLEXERIL) 10 MG tablet Take 10 mg by mouth daily as needed for muscle spasms.    02/02/2018 at Unknown time  . diltiazem (CARDIZEM CD) 240 MG 24 hr capsule Take 1 capsule (240 mg total) by mouth daily. 30 capsule 3 02/02/2018 at Unknown time  . escitalopram (LEXAPRO) 20 MG tablet TAKE ONE TABLET BY MOUTH DAILY 90 tablet 3 02/02/2018 at Unknown time  . furosemide (LASIX) 20 MG tablet Take 1 tablet (20 mg total) by mouth daily. 30 tablet 3 02/02/2018 at Unknown time  . levothyroxine (SYNTHROID, LEVOTHROID) 150 MCG tablet TAKE ONE TABLET BY MOUTH DAILY 90 tablet 2 02/02/2018 at Unknown time  . metoprolol tartrate (LOPRESSOR) 25 MG tablet Take 0.5 tablets  (12.5 mg total) by mouth 2 (two) times daily. 60 tablet 3 02/02/2018 at 1200  . Multiple Vitamins-Minerals (MULTIVITAMINS THER. W/MINERALS) TABS Take 1 tablet by mouth daily.   02/02/2018 at Unknown time  . nitroGLYCERIN (NITROSTAT) 0.4 MG SL tablet Place 1 tablet (0.4 mg total) under the tongue every 5 (five) minutes as needed for chest pain. 30 tablet 12 02/02/2018 at Unknown time  . nortriptyline (PAMELOR) 75 MG capsule TAKE ONE CAPSULE BY MOUTH AT BEDTIME 90 capsule 1 02/01/2018 at Unknown time  . Suvorexant (BELSOMRA) 10 MG TABS Take 10 mg by mouth at bedtime. 30 tablet 5 02/01/2018 at Unknown time   Fam HX:    Family History  Problem Relation Age of Onset  . Cancer Father        ?  mouth, died before patient born  . Cancer Mother        stomach, died when he was young  . Kidney cancer Sister   . Colon cancer Brother 107  . Esophageal cancer Neg Hx   . Rectal cancer Neg Hx   . Stomach cancer Neg Hx    Social HX:    Social History   Socioeconomic History  . Marital status: Married    Spouse name: [name]  . Number of children: Y  . Years of education: Not on file  . Highest education level: Not on file  Occupational History  . Occupation: retired Armed forces operational officer: RETIRED  Social Needs  . Financial resource strain: Not on file  . Food insecurity:    Worry: Not on file    Inability: Not on file  . Transportation needs:    Medical: Not on file    Non-medical: Not on file  Tobacco Use  . Smoking status: Former Smoker    Years: 4.00    Types: Cigars  . Smokeless tobacco: Never Used  . Tobacco comment: QUIT 2 YEARS AGO   2017  Substance and Sexual Activity  . Alcohol use: Yes    Alcohol/week: 2.0 standard drinks    Types: 2 Shots of liquor per week    Comment: socially  . Drug use: No  . Sexual activity: Not on file  Lifestyle  . Physical activity:    Days per week: Not on file    Minutes per session: Not on file  . Stress: Not on file  Relationships    . Social connections:    Talks on phone: Not on file    Gets together: Not on file    Attends religious service: Not on file    Active member of club or organization: Not on file    Attends meetings of clubs or organizations: Not on file    Relationship status: Not on file  . Intimate partner violence:    Fear of current or ex partner: Not on file    Emotionally abused: Not on file    Physically abused: Not on file    Forced sexual activity: Not on file  Other Topics Concern  . Not on file  Social History Narrative   Married 1965 (wife patient of Dr. Megan Salon). 2 sons. 4 granddaughters.       Retired Chemical engineer.       Hobbies: golf, fishing, formerly hunting.      ROS:  All  ROS were addressed and are negative except what is stated in the HPI  Physical Exam: Blood pressure 106/62, pulse 84, temperature 98 F (36.7 C), temperature source Oral, resp. rate (!) 24, height 5\' 11"  (1.803 m), weight 134.8 kg, SpO2 95 %.    General: Well developed, well nourished, in no acute distress Head: Eyes PERRLA, No xanthomas.   Normal cephalic and atramatic  Lungs:   Clear bilaterally to auscultation and percussion. Heart:   HRRR S1 S2 Pulses are 2+ & equal.            No carotid bruit. No JVD.  No abdominal bruits. No femoral bruits. Abdomen: Bowel sounds are positive, abdomen soft and non-tender without masses or                  Hernia's noted. Msk:  Back normal, normal gait. Normal strength and tone for age. Extremities:   No clubbing, cyanosis or edema.  DP +  1 Neuro: Alert and oriented X 3. Psych:  Good affect, responds appropriately    Labs:   Lab Results  Component Value Date   WBC 14.1 (H) 02/03/2018   HGB 13.2 02/03/2018   HCT 41.3 02/03/2018   MCV 92.4 02/03/2018   PLT 338 02/03/2018    Recent Labs  Lab 02/03/18 0902  NA 132*  K 4.1  CL 96*  CO2 22  BUN 16  CREATININE 1.64*  CALCIUM 8.7*  GLUCOSE 135*   No results found for: PTT Lab Results   Component Value Date   INR 1.20 02/03/2018   INR 1.01 04/17/2015   INR 0.95 08/26/2012   Lab Results  Component Value Date   TROPONINI 0.03 (HH) 02/03/2018     Lab Results  Component Value Date   CHOL 141 04/12/2017   CHOL 169 11/07/2015   CHOL 120 11/05/2014   Lab Results  Component Value Date   HDL 38.20 (L) 04/12/2017   HDL 33.30 (L) 11/07/2015   HDL 46.10 11/05/2014   Lab Results  Component Value Date   LDLCALC 69 04/12/2017   LDLCALC 98 11/07/2015   LDLCALC 50 11/05/2014   Lab Results  Component Value Date   TRIG 168.0 (H) 04/12/2017   TRIG 189.0 (H) 11/07/2015   TRIG 119.0 11/05/2014   Lab Results  Component Value Date   CHOLHDL 4 04/12/2017   CHOLHDL 5 11/07/2015   CHOLHDL 3 11/05/2014   Lab Results  Component Value Date   LDLDIRECT 62.0 09/07/2006      Radiology:  Dg Chest Port 1 View  Result Date: 02/03/2018 CLINICAL DATA:  Shortness of breath EXAM: PORTABLE CHEST 1 VIEW COMPARISON:  02/02/2018 FINDINGS: Shallow lung inflation with unchanged cardiomegaly. Mild right mid lung opacities are unchanged. Small left pleural effusion with associated atelectasis. IMPRESSION: Unchanged chest radiograph with mild right midlung opacity and small left pleural effusion. Electronically Signed   By: Ulyses Jarred M.D.   On: 02/03/2018 04:16   Dg Chest Port 1 View  Result Date: 02/02/2018 CLINICAL DATA:  Shortness of breath and RIGHT chest pain. Recent atrial fibrillation. EXAM: PORTABLE CHEST 1 VIEW COMPARISON:  Chest radiograph January 31, 2018 FINDINGS: Similar cardiomegaly. Calcified aortic arch. Pulmonary vasculature congestion and interstitial prominence with RIGHT mid lung zone consolidation. Small bilateral pleural effusions. No pneumothorax. No pneumothorax. Soft tissue planes and included osseous structures are non suspicious. IMPRESSION: 1. New RIGHT mid lung zone consolidation concerning for pneumonia versus confluent edema. 2. Stable cardiomegaly and  interstitial edema with small pleural effusions. Electronically Signed   By: Elon Alas M.D.   On: 02/02/2018 17:58     Telemetry    Sinus rhythm- Personally Reviewed  ECG    Sinus rhythm with incomplete left bundle branch block and no acute ST changes- Personally Reviewed   ASSESSMENT/PLAN:   1.  Shortness of breath/RML PNA/Acute on chronic diastolic CHF -He has a low-grade fever, leukocytosis and new right middle lobe infiltrate on chest x-ray consistent with pneumonia -Continue antibiotics per TRH -Also has some mild interstitial edema BNP was normal but likely falsely low due to obesity -Received Lasix 20 mg IV this morning I&O's are not recorded. -Creatinine has bumped to 1.64 from 1.35 IV Lasix. -VQ scan pending for elevated d-dimer -His baseline weight is usually around 285 pounds but went as high as 300 pounds recently but dropped to 294 pounds after Lasix was increased to 40 mg daily as an outpatient -Chest x-ray did show small left  pleural effusion yesterday and right middle lobe pneumonia -Urine appears cloudy and dark with possible blood. -We will send off UA -He clearly remains short of breath with increased weight above his baseline and therefore I will give him Lasix 40 mg IV now.  Creatinine has increased but BUN has remained in the normal range and I suspect creatinine is elevated due to passive congestion from CHF.  2.  Paroxysmal atrial fibrillation/flutter -S/P recent a flutter ablation -Xarelto currently on hold and on IV heparin per pharmacy -Continue amiodarone 200 mg daily, Cardizem CD 240 mg daily and Lopressor 12.5 mg twice daily  3.  Pleuritic chest pain -Recent nuclear stress test 12/05/2017 showed questionable small area of reversible ischemia in the apex and mild hypokinesis of the septum likely related to his incomplete left bundle branch block -Coronary CTA with FFR was ordered as follow-up to his nuclear stress test but has not been completed  yet -This pain is right-sided and likely related to his right middle lobe pneumonia as it is pleuritic and does not sound like coronary ischemia -Could also be due to a component of acute pericarditis. -Continue colchicine 0.6 mg twice daily  4.  Minimally elevated troponin at 0.033 -Related to demand ischemia in the setting of pneumonia -Nuclear stress test showed very small defect in the apex -Again his symptoms are not consistent with ACS and more consistent with pleuritic chest pain from his pneumonia -Unfortunately 2D echo was only done as a limited for pericardial effusion we will repeat complete 2D echocardiogram to reassess LV function.  If LV function normal then no further patient ischemic work-up.  Fransico Him, MD  02/03/2018  2:46 PM

## 2018-02-03 NOTE — ED Notes (Signed)
Attempted report. Called charge having nurse call me back.

## 2018-02-04 ENCOUNTER — Inpatient Hospital Stay (HOSPITAL_COMMUNITY): Payer: Medicare Other

## 2018-02-04 DIAGNOSIS — R079 Chest pain, unspecified: Secondary | ICD-10-CM

## 2018-02-04 DIAGNOSIS — I5043 Acute on chronic combined systolic (congestive) and diastolic (congestive) heart failure: Secondary | ICD-10-CM

## 2018-02-04 DIAGNOSIS — I5033 Acute on chronic diastolic (congestive) heart failure: Secondary | ICD-10-CM

## 2018-02-04 DIAGNOSIS — I313 Pericardial effusion (noninflammatory): Secondary | ICD-10-CM

## 2018-02-04 LAB — RENAL FUNCTION PANEL
Albumin: 2.8 g/dL — ABNORMAL LOW (ref 3.5–5.0)
Anion gap: 11 (ref 5–15)
BUN: 15 mg/dL (ref 8–23)
CO2: 27 mmol/L (ref 22–32)
Calcium: 8.7 mg/dL — ABNORMAL LOW (ref 8.9–10.3)
Chloride: 96 mmol/L — ABNORMAL LOW (ref 98–111)
Creatinine, Ser: 1.55 mg/dL — ABNORMAL HIGH (ref 0.61–1.24)
GFR calc Af Amer: 49 mL/min — ABNORMAL LOW (ref >60)
GFR calc non Af Amer: 42 mL/min — ABNORMAL LOW (ref >60)
Glucose, Bld: 101 mg/dL — ABNORMAL HIGH (ref 70–99)
Phosphorus: 3.4 mg/dL (ref 2.5–4.6)
Potassium: 3.9 mmol/L (ref 3.5–5.1)
Sodium: 134 mmol/L — ABNORMAL LOW (ref 135–145)

## 2018-02-04 LAB — CBC WITH DIFFERENTIAL/PLATELET
Abs Immature Granulocytes: 0.1 10*3/uL (ref 0.0–0.1)
Basophils Absolute: 0.1 10*3/uL (ref 0.0–0.1)
Basophils Relative: 0 %
Eosinophils Absolute: 0.6 10*3/uL (ref 0.0–0.7)
Eosinophils Relative: 4 %
HCT: 38.6 % — ABNORMAL LOW (ref 39.0–52.0)
Hemoglobin: 12.5 g/dL — ABNORMAL LOW (ref 13.0–17.0)
Immature Granulocytes: 1 %
Lymphocytes Relative: 12 %
Lymphs Abs: 1.6 10*3/uL (ref 0.7–4.0)
MCH: 29.8 pg (ref 26.0–34.0)
MCHC: 32.4 g/dL (ref 30.0–36.0)
MCV: 92.1 fL (ref 78.0–100.0)
Monocytes Absolute: 1.6 10*3/uL — ABNORMAL HIGH (ref 0.1–1.0)
Monocytes Relative: 12 %
Neutro Abs: 9.5 10*3/uL — ABNORMAL HIGH (ref 1.7–7.7)
Neutrophils Relative %: 71 %
Platelets: 319 10*3/uL (ref 150–400)
RBC: 4.19 MIL/uL — ABNORMAL LOW (ref 4.22–5.81)
RDW: 14.6 % (ref 11.5–15.5)
WBC: 13.5 10*3/uL — ABNORMAL HIGH (ref 4.0–10.5)

## 2018-02-04 LAB — HEPARIN LEVEL (UNFRACTIONATED)
Heparin Unfractionated: 0.11 IU/mL — ABNORMAL LOW (ref 0.30–0.70)
Heparin Unfractionated: 0.3 IU/mL (ref 0.30–0.70)

## 2018-02-04 LAB — ECHOCARDIOGRAM COMPLETE
Height: 71 in
WEIGHTICAEL: 4754.88 [oz_av]

## 2018-02-04 LAB — MAGNESIUM: Magnesium: 2.1 mg/dL (ref 1.7–2.4)

## 2018-02-04 MED ORDER — FUROSEMIDE 10 MG/ML IJ SOLN
40.0000 mg | Freq: Two times a day (BID) | INTRAMUSCULAR | Status: AC
  Administered 2018-02-04 (×2): 40 mg via INTRAVENOUS
  Filled 2018-02-04 (×2): qty 4

## 2018-02-04 MED ORDER — PERFLUTREN LIPID MICROSPHERE
1.0000 mL | INTRAVENOUS | Status: AC | PRN
  Administered 2018-02-04: 3 mL via INTRAVENOUS
  Filled 2018-02-04: qty 10

## 2018-02-04 MED ORDER — HEPARIN BOLUS VIA INFUSION
3000.0000 [IU] | Freq: Once | INTRAVENOUS | Status: AC
  Administered 2018-02-05: 3000 [IU] via INTRAVENOUS
  Filled 2018-02-04: qty 3000

## 2018-02-04 NOTE — Progress Notes (Signed)
ANTICOAGULATION CONSULT NOTE  Pharmacy Consult for Heparin Indication: atrial fibrillation/hx PE  No Known Allergies  Patient Measurements: Height: 5\' 11"  (180.3 cm) Weight: 297 lb 2.9 oz (134.8 kg) IBW/kg (Calculated) : 75.3 Heparin Dosing Weight: 105 kg  Vital Signs: Temp: 99.5 F (37.5 C) (08/09 1631) Temp Source: Oral (08/09 1631) BP: 134/84 (08/09 1631) Pulse Rate: 90 (08/09 1631)  Labs: Recent Labs    02/02/18 1727  02/02/18 2018 02/03/18 0351 02/03/18 0434 02/03/18 0902  02/03/18 1720 02/03/18 2245 02/04/18 0730 02/04/18 1849  HGB  --    < > 13.1  --   --  13.2  --   --   --  12.5*  --   HCT  --   --  39.7  --   --  41.3  --   --   --  38.6*  --   PLT  --   --  307  --   --  338  --   --   --  319  --   APTT  --   --   --   --  31  --   --   --   --   --   --   LABPROT  --   --   --   --  15.1  --   --   --   --   --   --   INR  --   --   --   --  1.20  --   --   --   --   --   --   HEPARINUNFRC  --   --   --   --  <0.10*  --    < >  --  0.11* 0.30 <0.10*  CREATININE 1.35*  --   --   --   --  1.64*  --   --   --  1.55*  --   TROPONINI  --   --   --  0.03*  --  0.03*  --  <0.03  --   --   --    < > = values in this interval not displayed.    Estimated Creatinine Clearance: 57.7 mL/min (A) (by C-G formula based on SCr of 1.55 mg/dL (H)).   Medical History: Past Medical History:  Diagnosis Date  . Allergy    States his nose runs when he is around grease.   Marland Kitchen Anxiety   . Arthritis   . Atrial fibrillation ([place])   . Cancer ([place])   . Coronary artery disease   . History of total knee replacement   . Hypertension   . Hypothyroidism   . Kidney cancer, primary, with metastasis from kidney to other site [facility])   . Kidney stone   . OSA (obstructive sleep apnea)    pt does not wear cpap at night  . Pituitary cyst ([place])   . Pneumonia    hx of  . Prostate cancer ([place])     Medications:  Medications Prior to Admission  Medication Sig Dispense Refill Last  Dose  . albuterol (PROVENTIL HFA;VENTOLIN HFA) 108 (90 Base) MCG/ACT inhaler Inhale 2 puffs into the lungs every 6 (six) hours as needed for wheezing or shortness of breath. 1 Inhaler 5 unk  . allopurinol (ZYLOPRIM) 300 MG tablet Take 0.5 tablets (150 mg total) by mouth daily. 30 tablet 3 02/02/2018 at 1200  . amiodarone (PACERONE) 200 MG tablet Take 1 tablet (200 mg  total) by mouth daily. 90 tablet 3 02/02/2018 at 1200  . apixaban (ELIQUIS) 5 MG TABS tablet Take 1 tablet (5 mg total) by mouth 2 (two) times daily. 60 tablet 1 02/02/2018 at 1200  . budesonide-formoterol (SYMBICORT) 160-4.5 MCG/ACT inhaler Inhale 2 puffs into the lungs 2 (two) times daily as needed (for respiratory flares).   unk  . clotrimazole-betamethasone (LOTRISONE) cream Apply 1 application topically 2 (two) times daily. Use for 10 days maximum. (Patient taking differently: Apply 1 application topically 2 (two) times daily as needed (for rashes). ) 45 g 1 unk  . colchicine 0.6 MG tablet Take 1 tablet (0.6 mg total) by mouth daily. 30 tablet 3 02/02/2018 at Unknown time  . cyclobenzaprine (FLEXERIL) 10 MG tablet Take 10 mg by mouth daily as needed for muscle spasms.    02/02/2018 at Unknown time  . diltiazem (CARDIZEM CD) 240 MG 24 hr capsule Take 1 capsule (240 mg total) by mouth daily. 30 capsule 3 02/02/2018 at Unknown time  . escitalopram (LEXAPRO) 20 MG tablet TAKE ONE TABLET BY MOUTH DAILY 90 tablet 3 02/02/2018 at Unknown time  . furosemide (LASIX) 20 MG tablet Take 1 tablet (20 mg total) by mouth daily. 30 tablet 3 02/02/2018 at Unknown time  . levothyroxine (SYNTHROID, LEVOTHROID) 150 MCG tablet TAKE ONE TABLET BY MOUTH DAILY 90 tablet 2 02/02/2018 at Unknown time  . metoprolol tartrate (LOPRESSOR) 25 MG tablet Take 0.5 tablets (12.5 mg total) by mouth 2 (two) times daily. 60 tablet 3 02/02/2018 at 1200  . Multiple Vitamins-Minerals (MULTIVITAMINS THER. W/MINERALS) TABS Take 1 tablet by mouth daily.   02/02/2018 at Unknown time  . nitroGLYCERIN  (NITROSTAT) 0.4 MG SL tablet Place 1 tablet (0.4 mg total) under the tongue every 5 (five) minutes as needed for chest pain. 30 tablet 12 02/02/2018 at Unknown time  . nortriptyline (PAMELOR) 75 MG capsule TAKE ONE CAPSULE BY MOUTH AT BEDTIME 90 capsule 1 02/01/2018 at Unknown time  . Suvorexant (BELSOMRA) 10 MG TABS Take 10 mg by mouth at bedtime. 30 tablet 5 02/01/2018 at Unknown time    Assessment: 77 y.o. male admitted with SOB/CHF, h/o Afib Eliquis on hold, for heparin. D-dimer was done and came back elevated at 1.58. Plan is to do CT.   Heparin level is undetectable at <0.1 s/p rate increase to 2300 units/hr No problems with the drip according to the nurse.  Goal of Therapy:  Heparin level 0.3-0.7 units/ml Monitor platelets by anticoagulation protocol: Yes   Plan:  Bolus with 3000 units and increase heparin gtt to 2600 units/hr F/u 8 hour level Monitor daily heparin level, CBC and s/s bleeding   Alanda Slim, PharmD, [facility] Clinical Pharmacist Please see AMION for all Pharmacists' Contact Phone Numbers 02/04/2018, 10:06 PM

## 2018-02-04 NOTE — Progress Notes (Signed)
  Echocardiogram 2D Echocardiogram has been performed.  Wayne Booth Wayne Booth 02/04/2018, 4:53 PM

## 2018-02-04 NOTE — Progress Notes (Signed)
ANTICOAGULATION CONSULT NOTE - Initial Consult  Pharmacy Consult for Heparin Indication: atrial fibrillation/hx PE  No Known Allergies  Patient Measurements: Height: 5\' 11"  (180.3 cm) Weight: 297 lb 2.9 oz (134.8 kg) IBW/kg (Calculated) : 75.3 Heparin Dosing Weight: 105 kg  Vital Signs: Temp: 98.1 F (36.7 C) (08/09 0728) Temp Source: Oral (08/09 0728) BP: 128/68 (08/09 0728)  Labs: Recent Labs    02/02/18 1727  02/02/18 2018 02/03/18 0351  02/03/18 0434 02/03/18 0902 02/03/18 1218 02/03/18 1720 02/03/18 2245 02/04/18 0730  HGB  --    < > 13.1  --   --   --  13.2  --   --   --  12.5*  HCT  --   --  39.7  --   --   --  41.3  --   --   --  38.6*  PLT  --   --  307  --   --   --  338  --   --   --  319  APTT  --   --   --   --   --  31  --   --   --   --   --   LABPROT  --   --   --   --   --  15.1  --   --   --   --   --   INR  --   --   --   --   --  1.20  --   --   --   --   --   HEPARINUNFRC  --   --   --   --    < > <0.10*  --  0.11*  --  0.11* 0.30  CREATININE 1.35*  --   --   --   --   --  1.64*  --   --   --  1.55*  TROPONINI  --   --   --  0.03*  --   --  0.03*  --  <0.03  --   --    < > = values in this interval not displayed.    Estimated Creatinine Clearance: 57.7 mL/min (A) (by C-G formula based on SCr of 1.55 mg/dL (H)).   Medical History: Past Medical History:  Diagnosis Date  . Allergy    States his nose runs when he is around grease.   Marland Kitchen Anxiety   . Arthritis   . Atrial fibrillation ([place])   . Cancer ([place])   . Coronary artery disease   . History of total knee replacement   . Hypertension   . Hypothyroidism   . Kidney cancer, primary, with metastasis from kidney to other site [facility])   . Kidney stone   . OSA (obstructive sleep apnea)    pt does not wear cpap at night  . Pituitary cyst ([place])   . Pneumonia    hx of  . Prostate cancer ([place])     Medications:  Medications Prior to Admission  Medication Sig Dispense Refill Last Dose  .  albuterol (PROVENTIL HFA;VENTOLIN HFA) 108 (90 Base) MCG/ACT inhaler Inhale 2 puffs into the lungs every 6 (six) hours as needed for wheezing or shortness of breath. 1 Inhaler 5 unk  . allopurinol (ZYLOPRIM) 300 MG tablet Take 0.5 tablets (150 mg total) by mouth daily. 30 tablet 3 02/02/2018 at 1200  . amiodarone (PACERONE) 200 MG tablet Take 1 tablet (200 mg total) by  mouth daily. 90 tablet 3 02/02/2018 at 1200  . apixaban (ELIQUIS) 5 MG TABS tablet Take 1 tablet (5 mg total) by mouth 2 (two) times daily. 60 tablet 1 02/02/2018 at 1200  . budesonide-formoterol (SYMBICORT) 160-4.5 MCG/ACT inhaler Inhale 2 puffs into the lungs 2 (two) times daily as needed (for respiratory flares).   unk  . clotrimazole-betamethasone (LOTRISONE) cream Apply 1 application topically 2 (two) times daily. Use for 10 days maximum. (Patient taking differently: Apply 1 application topically 2 (two) times daily as needed (for rashes). ) 45 g 1 unk  . colchicine 0.6 MG tablet Take 1 tablet (0.6 mg total) by mouth daily. 30 tablet 3 02/02/2018 at Unknown time  . cyclobenzaprine (FLEXERIL) 10 MG tablet Take 10 mg by mouth daily as needed for muscle spasms.    02/02/2018 at Unknown time  . diltiazem (CARDIZEM CD) 240 MG 24 hr capsule Take 1 capsule (240 mg total) by mouth daily. 30 capsule 3 02/02/2018 at Unknown time  . escitalopram (LEXAPRO) 20 MG tablet TAKE ONE TABLET BY MOUTH DAILY 90 tablet 3 02/02/2018 at Unknown time  . furosemide (LASIX) 20 MG tablet Take 1 tablet (20 mg total) by mouth daily. 30 tablet 3 02/02/2018 at Unknown time  . levothyroxine (SYNTHROID, LEVOTHROID) 150 MCG tablet TAKE ONE TABLET BY MOUTH DAILY 90 tablet 2 02/02/2018 at Unknown time  . metoprolol tartrate (LOPRESSOR) 25 MG tablet Take 0.5 tablets (12.5 mg total) by mouth 2 (two) times daily. 60 tablet 3 02/02/2018 at 1200  . Multiple Vitamins-Minerals (MULTIVITAMINS THER. W/MINERALS) TABS Take 1 tablet by mouth daily.   02/02/2018 at Unknown time  . nitroGLYCERIN  (NITROSTAT) 0.4 MG SL tablet Place 1 tablet (0.4 mg total) under the tongue every 5 (five) minutes as needed for chest pain. 30 tablet 12 02/02/2018 at Unknown time  . nortriptyline (PAMELOR) 75 MG capsule TAKE ONE CAPSULE BY MOUTH AT BEDTIME 90 capsule 1 02/01/2018 at Unknown time  . Suvorexant (BELSOMRA) 10 MG TABS Take 10 mg by mouth at bedtime. 30 tablet 5 02/01/2018 at Unknown time    Assessment: 77 y.o. male admitted with SOB/CHF, h/o Afib Eliquis on hold, for heparin. D-dimer was done and came back elevated at 1.58. Plan is to do CT.   Heparin level lower end of therapeutic at 0.3 s/p rate increase to 2200 units/hr Slight CBC drop, no bleeding reported at this time.  Goal of Therapy:  Heparin level 0.3-0.7 units/ml Monitor platelets by anticoagulation protocol: Yes   Plan:  Increase heparin gtt to 2300 units/hr F/u 8 hour level Monitor daily heparin level, CBC and s/s bleeding  Bertis Ruddy, PharmD Clinical Pharmacist 02/04/2018 9:26 AM

## 2018-02-04 NOTE — Progress Notes (Signed)
Pt resting comfortably- no distress.  BIPAP on stand-by at bedside.

## 2018-02-04 NOTE — Progress Notes (Addendum)
Progress Note  Patient Name: Wayne Booth Date of Encounter: 02/04/2018  Primary Cardiologist: Larae Grooms, MD   Subjective   Denies any CP overnight.  Still SOB but improved  Inpatient Medications    Scheduled Meds: . allopurinol  150 mg Oral Daily  . amiodarone  200 mg Oral Daily  . colchicine  0.6 mg Oral Daily  . diltiazem  240 mg Oral Daily  . escitalopram  20 mg Oral Daily  . levothyroxine  150 mcg Oral QAC breakfast  . mouth rinse  15 mL Mouth Rinse BID  . metoprolol tartrate  12.5 mg Oral BID  . mometasone-formoterol  2 puff Inhalation BID  . multivitamin with minerals  1 tablet Oral Daily  . nortriptyline  75 mg Oral QHS   Continuous Infusions: . ceFEPime (MAXIPIME) IV 1 g (02/04/18 0612)  . heparin 2,200 Units/hr (02/04/18 0039)   PRN Meds: acetaminophen **OR** acetaminophen, ALPRAZolam, nitroGLYCERIN, ondansetron **OR** ondansetron (ZOFRAN) IV, zolpidem   Vital Signs    Vitals:   02/03/18 2122 02/03/18 2344 02/04/18 0430 02/04/18 0728  BP:  (!) 109/95 126/73 128/68  Pulse:      Resp:  18 16 18   Temp:  98.3 F (36.8 C) 98 F (36.7 C) 98.1 F (36.7 C)  TempSrc:  Oral  Oral  SpO2: 96% 90% 95% 98%  Weight:      Height:        Intake/Output Summary (Last 24 hours) at 02/04/2018 0832 Last data filed at 02/04/2018 0612 Gross per 24 hour  Intake 700.89 ml  Output 1400 ml  Net -699.11 ml   Filed Weights   02/02/18 1724 02/03/18 0500  Weight: 132.5 kg 134.8 kg    Telemetry    NSR - Personally Reviewed  ECG    No new EKG to review - Personally Reviewed  Physical Exam   GEN: No acute distress.   Neck: No JVD Cardiac: RRR, no murmurs, rubs, or gallops.  Respiratory: decreased BS at bases with crackles in right lung at base and mid lung GI: Soft, nontender, non-distended  MS: 1-2+ LE edema; No deformity. Neuro:  Nonfocal  Psych: Normal affect   Labs    Chemistry Recent Labs  Lab 02/02/18 1727 02/03/18 0902 02/04/18 0730  NA  131* 132* 134*  K 3.9 4.1 3.9  CL 96* 96* 96*  CO2 25 22 27   GLUCOSE 97 135* 101*  BUN 13 16 15   CREATININE 1.35* 1.64* 1.55*  CALCIUM 8.4* 8.7* 8.7*  ALBUMIN  --   --  2.8*  GFRNONAA 50* 39* 42*  GFRAA 58* 46* 49*  ANIONGAP 10 14 11      Hematology Recent Labs  Lab 02/02/18 2018 02/03/18 0902 02/04/18 0730  WBC 16.6* 14.1* 13.5*  RBC 4.36 4.47 4.19*  HGB 13.1 13.2 12.5*  HCT 39.7 41.3 38.6*  MCV 91.1 92.4 92.1  MCH 30.0 29.5 29.8  MCHC 33.0 32.0 32.4  RDW 14.4 14.6 14.6  PLT 307 338 319    Cardiac Enzymes Recent Labs  Lab 02/03/18 0351 02/03/18 0902 02/03/18 1720  TROPONINI 0.03* 0.03* <0.03    Recent Labs  Lab 02/02/18 1819  TROPIPOC 0.12*     BNP Recent Labs  Lab 02/02/18 1727  BNP 54.3     DDimer  Recent Labs  Lab 02/03/18 1218  DDIMER 1.58*     Radiology    Ct Chest Wo Contrast  Result Date: 02/03/2018 CLINICAL DATA:  Dyspnea and right-sided chest pain with  fever. EXAM: CT CHEST WITHOUT CONTRAST TECHNIQUE: Multidetector CT imaging of the chest was performed following the standard protocol without IV contrast. COMPARISON:  Chest CT 10/20/2016, CXR 02/03/2018 FINDINGS: Cardiovascular: Cardiomegaly with trace pericardial effusion measuring up to 8 mm anteriorly. Scattered coronary arteriosclerosis is identified along the LAD. Aortic atherosclerosis is noted at the root and aortic arch. No aneurysm is visualized. The unenhanced pulmonary vasculature is unremarkable. Mediastinum/Nodes: Mediastinal lipomatosis. Mild enlargement of right lower paratracheal lymph node to 15 mm short axis with aortic O pulmonary window lymph node measuring up to 11 mm short axis. Smaller superior mediastinal lymph nodes are present. These are likely reactive. Trachea and mainstem bronchi appear patent. Esophagus is unremarkable. No thyromegaly or mass. Lungs/Pleura: Patchy bibasilar, right middle and right upper lobe airspace opacities with air bronchograms consistent with  multilobar pneumonia. Small bilateral pleural effusions left greater than right are noted compatible with small parapneumonic effusions. Adjacent atelectasis is seen. No pneumothorax is noted. Upper Abdomen: The patient is status post left nephrectomy. Stable appearance of the retroperitoneum on the left. Soft tissue structure in the left upper quadrant may represent partial volume averaging of the spleen with displacement due to nephrectomy. Surgically absent gallbladder. Mild steatosis of liver. Musculoskeletal: Osteophytosis of the thoracic spine with exaggerated kyphosis. Chronic posterior right eleventh and anterolateral fourth rib fractures. No acute osseous abnormality. IMPRESSION: 1. Bilateral multilobar pneumonia involving the right upper, middle and both lower lobes with air bronchograms and reactive adenopathy. Small parapneumonic effusions are noted bilaterally. 2. Coronary arteriosclerosis and aortic atherosclerosis. 3. Mediastinal lipomatosis. 4. Hepatic steatosis. 5. Status post cholecystectomy and left nephrectomy. Aortic Atherosclerosis (ICD10-I70.0). Electronically Signed   By: Ashley Royalty M.D.   On: 02/03/2018 18:28   Nm Pulmonary Perf And Vent  Result Date: 02/03/2018 CLINICAL DATA:  Pulmonary embolism EXAM: NUCLEAR MEDICINE VENTILATION - PERFUSION LUNG SCAN TECHNIQUE: Ventilation images were obtained in multiple projections using inhaled aerosol Tc-15m DTPA. Perfusion images were obtained in multiple projections after intravenous injection of Tc-26m-MAA. RADIOPHARMACEUTICALS:  32.5 mCi of Tc-8m DTPA aerosol inhalation and 3.7 mCi Tc69m-MAA IV COMPARISON:  11/02/2017 Correlation: Chest radiograph 02/03/2018 FINDINGS: Ventilation: Central airway deposition of aerosol. Ventilation defects identified in lateral RIGHT mid lung and throughout entire LEFT lower lobe. Perfusion: Much better perfusion and ventilation in the lateral RIGHT mid lung. Perfusion to the LEFT lower lobe much better than  ventilation. Remaining perfusion pattern is normal. Perfusion abnormality in LEFT lower lobe is new since the previous exam. Chest radiograph: Enlargement of cardiac silhouette with atelectasis versus consolidation in LEFT lower lobe as well as mild atelectasis in RIGHT mid lung and at RIGHT base IMPRESSION: Much better perfusion than ventilation in lateral RIGHT mid lung and throughout LEFT lower lobe, with remaining perfusion normal. Findings represent a low probability for pulmonary embolism. Electronically Signed   By: Lavonia Dana M.D.   On: 02/03/2018 16:43   Dg Chest Port 1 View  Result Date: 02/03/2018 CLINICAL DATA:  Shortness of breath EXAM: PORTABLE CHEST 1 VIEW COMPARISON:  02/02/2018 FINDINGS: Shallow lung inflation with unchanged cardiomegaly. Mild right mid lung opacities are unchanged. Small left pleural effusion with associated atelectasis. IMPRESSION: Unchanged chest radiograph with mild right midlung opacity and small left pleural effusion. Electronically Signed   By: Ulyses Jarred M.D.   On: 02/03/2018 04:16   Dg Chest Port 1 View  Result Date: 02/02/2018 CLINICAL DATA:  Shortness of breath and RIGHT chest pain. Recent atrial fibrillation. EXAM: PORTABLE CHEST 1 VIEW COMPARISON:  Chest radiograph January 31, 2018 FINDINGS: Similar cardiomegaly. Calcified aortic arch. Pulmonary vasculature congestion and interstitial prominence with RIGHT mid lung zone consolidation. Small bilateral pleural effusions. No pneumothorax. No pneumothorax. Soft tissue planes and included osseous structures are non suspicious. IMPRESSION: 1. New RIGHT mid lung zone consolidation concerning for pneumonia versus confluent edema. 2. Stable cardiomegaly and interstitial edema with small pleural effusions. Electronically Signed   By: Elon Alas M.D.   On: 02/02/2018 17:58    Cardiac Studies   2D echo limited 02/02/2018 Study Conclusions  - Pericardium, extracardiac: A trivial pericardial effusion was    identified. No significant echocardiographic features to support   tamponade physiology, however, there is mild variation in TV/MV   inflow noted. The IVC does not appear to collapse, however, this   was not fully evaluated. Clinical correlation with tamponade   features is recommended.  Impressions:  - Trivial pericardial effusion. No clear tamponade features,   however, this remains a clinical diagnosis.  Patient Profile     77 y.o. male with a history of paroxysmal atrial fibrillation/atrial flutter hypertension, obstructive sleep apnea intolerant to CPAP and hypothyroidism.  Patient had a left heart catheterization done in 2008 which showed normal coronary arteries.  He had surgery for his hand a few weeks later after cath and was diagnosed with a PE and placed on Xarelto.  He was subsequently diagnosed with atrial flutter in May 2019 and was hospitalized and converted to sinus rhythm and discharged on Eliquis.  S/P atrial flutter ablation 01/28/2018.   Was seen back in the A. fib clinic on 01/31/2018 secondary to recurrent shortness of breath as well as right-sided pleuritic chest pain after his ablation.  His weight was up 7 pounds.  O2 sats are 93% on room air in the office.  He was noted to have 2+ pitting edema in the office.  EKG showed maintenance of normal sinus rhythm.  His Lasix was increased to 40 mg daily.  He called the office yesterday because of ongoing shortness of breath and chest pain that was occurring both with exertion and at rest.  He was instructed to go to the emergency room for further assessment.  Assessment & Plan    1.  Shortness of breath/RML PNA/Acute on chronic diastolic CHF -He has had low-grade fevers, leukocytosis and new right middle lobe infiltrate on chest x-ray consistent with pneumonia -Continue antibiotics per [facility] - afebrile x 24 hours -BNPnormal -  But could be falsely low due to obesity -Creatinine has bumped to 1.64 from 1.35 IV Lasix but now back to  1.55 -VQ scan  for elevated d-dimer low probability -His baseline weight is usually around 285 pounds but went as high as 300 pounds recently> dropped to 294 pounds after Lasix was increased to 40 mg daily as an outpatient.  Now 297lbs today.  -Chest x-ray did show small left pleural effusion along with right middle lobe pneumonia -Given Lasix 40mg  IV yesterday and put out 1.4L and is net neg 498cc.   -creatinine improved after IV Lasix (1.64>1.55) -Still has LE edema on exam and decreased BS at bases and crackles at right base so will give lasix 40mg  IV BID today and reassess in am  2.  Paroxysmal atrial fibrillation/flutter -S/P recent a flutter ablation -Xarelto currently on hold and on IV heparin per pharmacy -Continue amiodarone 200 mg daily, Cardizem CD 240 mg daily and Lopressor 12.5 mg twice daily  3.  Pleuritic chest pain -Recent nuclear stress test  12/05/2017 showed questionable small area of reversible ischemia in the apex and mild hypokinesis of the septum likely related to his incomplete left bundle branch block -Coronary CTA with FFR was ordered as follow-up to his nuclear stress test but has not been completed yet -This pain is right-sided and likely related to his right middle lobe pneumonia as it is pleuritic and does not sound like coronary ischemia -Could also be due to a component of acute pericarditis. -Continue colchicine 0.6 mg twice daily  4.  Minimally elevated troponin at 0.033 -Related to demand ischemia in the setting of pneumonia -Nuclear stress test showed very small defect in the apex -Again his symptoms are not consistent with ACS and more consistent with pleuritic chest pain from his pneumonia -Unfortunately 2D echo was only done as a limited for pericardial effusion - repeat echo to reassess LV function is pending.  If LV function normal then no further inpatient ischemic work-up.    I have spent a total of 35 minutes with patient reviewing 2D echo,  cxray , telemetry, EKGs, labs and examining patient as well as establishing an assessment and plan that was discussed with the patient.  > 50% of time was spent in direct patient care.    For questions or updates, please contact [place] Please consult [email or web address] for contact info under Cardiology/STEMI.      Signed, Fransico Him, MD  02/04/2018, 8:32 AM

## 2018-02-04 NOTE — Care Management Important Message (Signed)
Important Message  Patient Details  Name: Wayne Booth MRN: [id number] Date of Birth: 1940-10-07   Medicare Important Message Given:  Yes    Orbie Pyo 02/04/2018, 3:45 PM

## 2018-02-04 NOTE — Progress Notes (Signed)
PROGRESS NOTE    Wayne Booth  JHE:[id number] DOB: 1941/03/16 DOA: 02/02/2018  PCP: Marin Olp, MD   Brief Narrative:  77 year old male, morbidly obese, with past medical history significant for atrial fibrillation status post recent ablation, OSA but noncompliant with CPAP, hypothyroidism, history of pulmonary embolism, hypertension, anxiety, nephrolithiasis, documented renal and prostate cancer.  Patient is said to have undergone nephrectomy with me, and has one kidney.  Patient was admitted with shortness of breath, right-sided chest pain, fever, small pericardial effusion and worsening renal function.  Patient also reports being significantly anxious.  Chest x-ray done on presentation is worrisome for possible pneumonia.  Serum creatinine has gone from 1.16-1 0.29-1 0.25-1.64.  Urine output is decreased.  Patient reports poor p.o. intake.  Patient's diuretics was doubled within the last 1 to 2 weeks as the patient was said to have gained 14 pounds in weight.  Procalcitonin level is 0.63.  Leukocytosis of 14.1 (down from 16.6) is noted.  ABG done revealed pH of 7.405, PCO2 of 38.1 and PO2 of 72.  Cardiac BMP done on 02/02/2018 was 54.3, and troponin has been 0.03 on 2 occasions.  Check d-dimer, proceed with CT of the chest without contrast, work-up patient's acute kidney injury.   Assessment & Plan:   Right middle lobe pneumonia - PT with right middle lobe infiltrate suspicious for pneumonia - D/C'ed vanco and pt on cefepime  - V/Q scan - low probability for PE  Acute on chronic diastolic CHF - Per cardio, continue Lasix 40 mg IV 2 times daily - Monitor daily weight and strict intake and output - Cardio following - ECHO showed preserved EF  Right-sided chest pain - Patient has prior history of pulmonary embolism. - Patient has history of prostate and renal cancer. - Patient recently underwent ablation by the cardiology team for atrial fibrillation. - Continue heparin drip for  now  Acute kidney injury - Likely prerenal etiology, lasix - Lasix on hold - Follow up BMP in am  Paroxysmal atrial fibrillation - CHADS vasc score 3 - Continue amiodarone 200 mg daily - Continue Cardizem 240 mg daily, metoprolol 12.5 mg twice a day  Hypertension, essential - Continue Cardizem and metoprolol   Hypothyroidism - Continue synthroid 150 mcg daily   Morbid obesity/OSA - Patient is not compliant with CPAP  Anxiety and depression - Continue xanax and lexapro - Continue Pamelor    DVT prophylaxis: Heparin drip  Code Status: full code  Family Communication: no family at the bedside Disposition Plan: home once pneumonia resolves    Consultants:   Cardiology, Dr. Fransico Him   Procedures:   ECHO - EF 55%  Antimicrobials:   Cefepime 8/8 -->   Subjective: Says he feels better.  Objective: Vitals:   02/04/18 0430 02/04/18 0728 02/04/18 1221 02/04/18 1631  BP: 126/73 128/68 115/70 134/84  Pulse:    90  Resp: 16 18 18  (!) 30  Temp: 98 F (36.7 C) 98.1 F (36.7 C) 98.6 F (37 C) 99.5 F (37.5 C)  TempSrc:  Oral Oral Oral  SpO2: 95% 98% 100% 94%  Weight:      Height:        Intake/Output Summary (Last 24 hours) at 02/04/2018 1843 Last data filed at 02/04/2018 1301 Gross per 24 hour  Intake 400 ml  Output 2600 ml  Net -2200 ml   Filed Weights   02/02/18 1724 02/03/18 0500  Weight: 132.5 kg 134.8 kg    Examination:  General exam:  Appears calm and comfortable  Respiratory system: Diminished breath sounds, no wheezing  Cardiovascular system: S1 & S2 heard, RRR. No JVD, murmurs, rubs, gallops or clicks. No pedal edema. Gastrointestinal system: Abdomen is nondistended, soft and nontender. No organomegaly or masses felt. Normal bowel sounds heard. Central nervous system: Alert and oriented. No focal neurological deficits. Extremities: Symmetric 5 x 5 power. Slight LE pitting edema Skin: No rashes, lesions or ulcers Psychiatry: Judgement  and insight appear normal. Mood & affect appropriate.   Data Reviewed: I have personally reviewed following labs and imaging studies  CBC: Recent Labs  Lab 02/02/18 2018 02/03/18 0902 02/04/18 0730  WBC 16.6* 14.1* 13.5*  NEUTROABS 12.4*  --  9.5*  HGB 13.1 13.2 12.5*  HCT 39.7 41.3 38.6*  MCV 91.1 92.4 92.1  PLT 307 338 902   Basic Metabolic Panel: Recent Labs  Lab 02/02/18 1727 02/03/18 0902 02/04/18 0730  NA 131* 132* 134*  K 3.9 4.1 3.9  CL 96* 96* 96*  CO2 25 22 27   GLUCOSE 97 135* 101*  BUN 13 16 15   CREATININE 1.35* 1.64* 1.55*  CALCIUM 8.4* 8.7* 8.7*  MG  --   --  2.1  PHOS  --   --  3.4   GFR: Estimated Creatinine Clearance: 57.7 mL/min (A) (by C-G formula based on SCr of 1.55 mg/dL (H)). Liver Function Tests: Recent Labs  Lab 02/04/18 0730  ALBUMIN 2.8*   No results for input(s): LIPASE, AMYLASE in the last 168 hours. No results for input(s): AMMONIA in the last 168 hours. Coagulation Profile: Recent Labs  Lab 02/03/18 0434  INR 1.20   Cardiac Enzymes: Recent Labs  Lab 02/03/18 0351 02/03/18 0902 02/03/18 1720  TROPONINI 0.03* 0.03* <0.03   BNP (last 3 results) No results for input(s): PROBNP in the last 8760 hours. HbA1C: No results for input(s): HGBA1C in the last 72 hours. CBG: Recent Labs  Lab 02/02/18 1811  GLUCAP 85   Lipid Profile: No results for input(s): CHOL, HDL, LDLCALC, TRIG, CHOLHDL, LDLDIRECT in the last 72 hours. Thyroid Function Tests: No results for input(s): TSH, T4TOTAL, FREET4, T3FREE, THYROIDAB in the last 72 hours. Anemia Panel: No results for input(s): VITAMINB12, FOLATE, FERRITIN, TIBC, IRON, RETICCTPCT in the last 72 hours. Urine analysis:    Component Value Date/Time   COLORURINE AMBER (A) 02/03/2018 1620   APPEARANCEUR CLEAR 02/03/2018 1620   LABSPEC 1.016 02/03/2018 1620   PHURINE 5.0 02/03/2018 1620   GLUCOSEU NEGATIVE 02/03/2018 1620   HGBUR NEGATIVE 02/03/2018 1620   HGBUR 2+ 09/22/2007 1444     BILIRUBINUR NEGATIVE 02/03/2018 1620   BILIRUBINUR n 11/07/2015 1417   KETONESUR NEGATIVE 02/03/2018 1620   PROTEINUR NEGATIVE 02/03/2018 1620   UROBILINOGEN 1.0 11/07/2015 1417   UROBILINOGEN 1.0 05/22/2011 1454   NITRITE NEGATIVE 02/03/2018 1620   LEUKOCYTESUR NEGATIVE 02/03/2018 1620   Sepsis Labs: @LABRCNTIP (procalcitonin:4,lacticidven:4)   ) Recent Results (from the past 240 hour(s))  Culture, blood (routine x 2)     Status: None (Preliminary result)   Collection Time: 02/02/18  5:27 PM  Result Value Ref Range Status   Specimen Description BLOOD BLOOD LEFT FOREARM  Final   Special Requests   Final    BOTTLES DRAWN AEROBIC AND ANAEROBIC Blood Culture results may not be optimal due to an inadequate volume of blood received in culture bottles   Culture   Final    NO GROWTH 2 DAYS Performed at [facility], [place] [address] [place], [place]  [address]    Report Status PENDING  Incomplete  MRSA PCR Screening     Status: None   Collection Time: 02/03/18  2:44 AM  Result Value Ref Range Status   MRSA by PCR NEGATIVE NEGATIVE Final    Comment:        The GeneXpert MRSA Assay (FDA approved for NASAL specimens only), is one component of a comprehensive MRSA colonization surveillance program. It is not intended to diagnose MRSA infection nor to guide or monitor treatment for MRSA infections. Performed at [facility], [place] [address] [place], [place] [address]   Culture, sputum-assessment     Status: None   Collection Time: 02/03/18  5:08 PM  Result Value Ref Range Status   Specimen Description EXPECTORATED SPUTUM  Final   Special Requests NONE  Final   Sputum evaluation   Final    THIS SPECIMEN IS ACCEPTABLE FOR SPUTUM CULTURE Performed at [facility], [address], [place], [place] [address]    Report Status 02/03/2018 FINAL  Final  Culture, respiratory     Status: None (Preliminary result)   Collection Time: 02/03/18  5:08 PM  Result Value Ref  Range Status   Specimen Description EXPECTORATED SPUTUM  Final   Special Requests NONE Reflexed from [id number]  Final   Gram Stain   Final    ABUNDANT WBC PRESENT, PREDOMINANTLY PMN MODERATE GRAM POSITIVE COCCI Performed at [facility], [place] [address], [place], [place] [address]    Culture PENDING  Incomplete   Report Status PENDING  Incomplete      Radiology Studies: Ct Chest Wo Contrast  Result Date: 02/03/2018 CLINICAL DATA:  Dyspnea and right-sided chest pain with fever. EXAM: CT CHEST WITHOUT CONTRAST TECHNIQUE: Multidetector CT imaging of the chest was performed following the standard protocol without IV contrast. COMPARISON:  Chest CT 10/20/2016, CXR 02/03/2018 FINDINGS: Cardiovascular: Cardiomegaly with trace pericardial effusion measuring up to 8 mm anteriorly. Scattered coronary arteriosclerosis is identified along the LAD. Aortic atherosclerosis is noted at the root and aortic arch. No aneurysm is visualized. The unenhanced pulmonary vasculature is unremarkable. Mediastinum/Nodes: Mediastinal lipomatosis. Mild enlargement of right lower paratracheal lymph node to 15 mm short axis with aortic O pulmonary window lymph node measuring up to 11 mm short axis. Smaller superior mediastinal lymph nodes are present. These are likely reactive. Trachea and mainstem bronchi appear patent. Esophagus is unremarkable. No thyromegaly or mass. Lungs/Pleura: Patchy bibasilar, right middle and right upper lobe airspace opacities with air bronchograms consistent with multilobar pneumonia. Small bilateral pleural effusions left greater than right are noted compatible with small parapneumonic effusions. Adjacent atelectasis is seen. No pneumothorax is noted. Upper Abdomen: The patient is status post left nephrectomy. Stable appearance of the retroperitoneum on the left. Soft tissue structure in the left upper quadrant may represent partial volume averaging of the spleen with displacement due to nephrectomy.  Surgically absent gallbladder. Mild steatosis of liver. Musculoskeletal: Osteophytosis of the thoracic spine with exaggerated kyphosis. Chronic posterior right eleventh and anterolateral fourth rib fractures. No acute osseous abnormality. IMPRESSION: 1. Bilateral multilobar pneumonia involving the right upper, middle and both lower lobes with air bronchograms and reactive adenopathy. Small parapneumonic effusions are noted bilaterally. 2. Coronary arteriosclerosis and aortic atherosclerosis. 3. Mediastinal lipomatosis. 4. Hepatic steatosis. 5. Status post cholecystectomy and left nephrectomy. Aortic Atherosclerosis (ICD10-I70.0). Electronically Signed   By: Ashley Royalty M.D.   On: 02/03/2018 18:28   Nm Pulmonary Perf And Vent  Result Date: 02/03/2018  CLINICAL DATA:  Pulmonary embolism EXAM: NUCLEAR MEDICINE VENTILATION - PERFUSION LUNG SCAN TECHNIQUE: Ventilation images were obtained in multiple projections using inhaled aerosol Tc-47m DTPA. Perfusion images were obtained in multiple projections after intravenous injection of Tc-36m-MAA. RADIOPHARMACEUTICALS:  32.5 mCi of Tc-42m DTPA aerosol inhalation and 3.7 mCi Tc24m-MAA IV COMPARISON:  11/02/2017 Correlation: Chest radiograph 02/03/2018 FINDINGS: Ventilation: Central airway deposition of aerosol. Ventilation defects identified in lateral RIGHT mid lung and throughout entire LEFT lower lobe. Perfusion: Much better perfusion and ventilation in the lateral RIGHT mid lung. Perfusion to the LEFT lower lobe much better than ventilation. Remaining perfusion pattern is normal. Perfusion abnormality in LEFT lower lobe is new since the previous exam. Chest radiograph: Enlargement of cardiac silhouette with atelectasis versus consolidation in LEFT lower lobe as well as mild atelectasis in RIGHT mid lung and at RIGHT base IMPRESSION: Much better perfusion than ventilation in lateral RIGHT mid lung and throughout LEFT lower lobe, with remaining perfusion normal. Findings  represent a low probability for pulmonary embolism. Electronically Signed   By: Lavonia Dana M.D.   On: 02/03/2018 16:43   Dg Chest Port 1 View  Result Date: 02/03/2018 CLINICAL DATA:  Shortness of breath EXAM: PORTABLE CHEST 1 VIEW COMPARISON:  02/02/2018 FINDINGS: Shallow lung inflation with unchanged cardiomegaly. Mild right mid lung opacities are unchanged. Small left pleural effusion with associated atelectasis. IMPRESSION: Unchanged chest radiograph with mild right midlung opacity and small left pleural effusion. Electronically Signed   By: Ulyses Jarred M.D.   On: 02/03/2018 04:16   Dg Chest Port 1 View  Result Date: 02/02/2018 CLINICAL DATA:  Shortness of breath and RIGHT chest pain. Recent atrial fibrillation. EXAM: PORTABLE CHEST 1 VIEW COMPARISON:  Chest radiograph January 31, 2018 FINDINGS: Similar cardiomegaly. Calcified aortic arch. Pulmonary vasculature congestion and interstitial prominence with RIGHT mid lung zone consolidation. Small bilateral pleural effusions. No pneumothorax. No pneumothorax. Soft tissue planes and included osseous structures are non suspicious. IMPRESSION: 1. New RIGHT mid lung zone consolidation concerning for pneumonia versus confluent edema. 2. Stable cardiomegaly and interstitial edema with small pleural effusions. Electronically Signed   By: Elon Alas M.D.   On: 02/02/2018 17:58        Scheduled Meds: . allopurinol  150 mg Oral Daily  . amiodarone  200 mg Oral Daily  . colchicine  0.6 mg Oral Daily  . diltiazem  240 mg Oral Daily  . escitalopram  20 mg Oral Daily  . levothyroxine  150 mcg Oral QAC breakfast  . mouth rinse  15 mL Mouth Rinse BID  . metoprolol tartrate  12.5 mg Oral BID  . mometasone-formoterol  2 puff Inhalation BID  . multivitamin with minerals  1 tablet Oral Daily  . nortriptyline  75 mg Oral QHS   Continuous Infusions: . ceFEPime (MAXIPIME) IV 1 g (02/04/18 1301)  . heparin 2,300 Units/hr (02/04/18 0935)     LOS: 2  days    Time spent: 25 minutes Greater than 50% of the time spent on counseling and coordinating the care.   Leisa Lenz, MD [facility] Pager [phone]  If 7PM-7AM, please contact night-coverage [email or web address] Password [facility] 02/04/2018, 6:43 PM

## 2018-02-04 NOTE — Progress Notes (Signed)
ANTICOAGULATION CONSULT NOTE - Follow Up Consult  Pharmacy Consult for heparin Indication: Afib and h/o VTE  Labs: Recent Labs    02/02/18 1727 02/02/18 2018 02/03/18 0351 02/03/18 0434 02/03/18 0902 02/03/18 1218 02/03/18 1720 02/03/18 2245  HGB  --  13.1  --   --  13.2  --   --   --   HCT  --  39.7  --   --  41.3  --   --   --   PLT  --  307  --   --  338  --   --   --   APTT  --   --   --  31  --   --   --   --   LABPROT  --   --   --  15.1  --   --   --   --   INR  --   --   --  1.20  --   --   --   --   HEPARINUNFRC  --   --   --  <0.10*  --  0.11*  --  0.11*  CREATININE 1.35*  --   --   --  1.64*  --   --   --   TROPONINI  --   --  0.03*  --  0.03*  --  <0.03  --     Assessment: 76yo male subtherapeutic on heparin with no change in level despite rate change; RN notes no gtt issues or signs of bleeding or changes in hematoma.  Goal of Therapy:  Heparin level 0.3-0.7 units/ml   Plan:  Will increase heparin gtt by 4 units/kgABW/hr to 2200 units/hr and check level in 8 hours.    Wynona Neat, PharmD, BCPS  02/04/2018,12:36 AM

## 2018-02-05 ENCOUNTER — Inpatient Hospital Stay (HOSPITAL_COMMUNITY): Payer: Medicare Other

## 2018-02-05 DIAGNOSIS — M7989 Other specified soft tissue disorders: Secondary | ICD-10-CM

## 2018-02-05 DIAGNOSIS — M79609 Pain in unspecified limb: Secondary | ICD-10-CM

## 2018-02-05 LAB — BASIC METABOLIC PANEL
ANION GAP: 13 (ref 5–15)
BUN: 15 mg/dL (ref 8–23)
CHLORIDE: 97 mmol/L — AB (ref 98–111)
CO2: 22 mmol/L (ref 22–32)
Calcium: 8.3 mg/dL — ABNORMAL LOW (ref 8.9–10.3)
Creatinine, Ser: 1.49 mg/dL — ABNORMAL HIGH (ref 0.61–1.24)
GFR calc non Af Amer: 44 mL/min — ABNORMAL LOW (ref >60)
GFR, EST AFRICAN AMERICAN: 51 mL/min — AB (ref >60)
Glucose, Bld: 98 mg/dL (ref 70–99)
POTASSIUM: 3.8 mmol/L (ref 3.5–5.1)
SODIUM: 132 mmol/L — AB (ref 135–145)

## 2018-02-05 LAB — CBC
HCT: 38 % — ABNORMAL LOW (ref 39.0–52.0)
Hemoglobin: 12.4 g/dL — ABNORMAL LOW (ref 13.0–17.0)
MCH: 29.7 pg (ref 26.0–34.0)
MCHC: 32.6 g/dL (ref 30.0–36.0)
MCV: 90.9 fL (ref 78.0–100.0)
Platelets: 327 10*3/uL (ref 150–400)
RBC: 4.18 MIL/uL — ABNORMAL LOW (ref 4.22–5.81)
RDW: 14.2 % (ref 11.5–15.5)
WBC: 13 10*3/uL — ABNORMAL HIGH (ref 4.0–10.5)

## 2018-02-05 LAB — LEGIONELLA PNEUMOPHILA SEROGP 1 UR AG: L. pneumophila Serogp 1 Ur Ag: NEGATIVE

## 2018-02-05 LAB — HEPARIN LEVEL (UNFRACTIONATED)
HEPARIN UNFRACTIONATED: 0.36 [IU]/mL (ref 0.30–0.70)
Heparin Unfractionated: 0.3 IU/mL (ref 0.30–0.70)

## 2018-02-05 MED ORDER — FUROSEMIDE 10 MG/ML IJ SOLN: 40.0000 mg | Freq: Two times a day (BID) | INTRAMUSCULAR | Status: DC

## 2018-02-05 MED ORDER — IPRATROPIUM-ALBUTEROL 0.5-2.5 (3) MG/3ML IN SOLN
3.0000 mL | Freq: Four times a day (QID) | RESPIRATORY_TRACT | Status: DC
  Administered 2018-02-05 – 2018-02-09 (×16): 3 mL via RESPIRATORY_TRACT
  Filled 2018-02-05 (×16): qty 3

## 2018-02-05 MED ORDER — FUROSEMIDE 10 MG/ML IJ SOLN
40.0000 mg | Freq: Three times a day (TID) | INTRAMUSCULAR | Status: DC
  Administered 2018-02-05 – 2018-02-06 (×4): 40 mg via INTRAVENOUS
  Filled 2018-02-05 (×6): qty 4

## 2018-02-05 MED ORDER — BUDESONIDE 0.25 MG/2ML IN SUSP
0.2500 mg | Freq: Two times a day (BID) | RESPIRATORY_TRACT | Status: DC
  Administered 2018-02-05 – 2018-02-17 (×24): 0.25 mg via RESPIRATORY_TRACT
  Filled 2018-02-05 (×24): qty 2

## 2018-02-05 MED ORDER — APIXABAN 5 MG PO TABS
5.0000 mg | ORAL_TABLET | Freq: Two times a day (BID) | ORAL | Status: DC
  Administered 2018-02-05 – 2018-02-12 (×15): 5 mg via ORAL
  Filled 2018-02-05 (×16): qty 1

## 2018-02-05 NOTE — Progress Notes (Signed)
Pt placed on BIPAP for the night.  Tolerating well.

## 2018-02-05 NOTE — Progress Notes (Signed)
PROGRESS NOTE    Wayne Booth  HCW:[id number] DOB: [birth year] DOA: 02/02/2018  PCP: Marin Olp, MD   Brief Narrative:  77 year old male, morbidly obese, with past medical history significant for atrial fibrillation status post recent ablation, OSA but noncompliant with CPAP, hypothyroidism, history of pulmonary embolism, hypertension, anxiety, nephrolithiasis, documented renal and prostate cancer.  Patient is said to have undergone nephrectomy with me, and has one kidney.  Patient was admitted with shortness of breath, right-sided chest pain, fever, small pericardial effusion and worsening renal function.  Patient also reports being significantly anxious.  Chest x-ray done on presentation is worrisome for possible pneumonia.  Serum creatinine has gone from 1.16-1 0.29-1 0.25-1.64.  Urine output is decreased.  Patient reports poor p.o. intake.  Patient's diuretics was doubled within the last 1 to 2 weeks as the patient was said to have gained 14 pounds in weight.  Procalcitonin level is 0.63.  Leukocytosis of 14.1 (down from 16.6) is noted.  ABG done revealed pH of 7.405, PCO2 of 38.1 and PO2 of 72.  Cardiac BMP done on 02/02/2018 was 54.3, and troponin has been 0.03 on 2 occasions.    D-dimer was elevated.  VQ scan was low probability for pulmonary embolism.  CT scan of the chest without contrast was suggestive of pneumonia.  Acute kidney injury is improving with diuresis.   02/05/2018: Cardiology input is appreciated.  We will transition her heparin drip back to Eliquis (discussed with pharmacy).  Will discontinue Dulera.  Will start patient on nebs DuoNeb as well as Pulmicort.  We will continue IV antibiotics.  We will continue diuretics.  We will continue to monitor renal function and electrolytes.   Assessment & Plan:   Right middle lobe pneumonia - PT with right middle lobe infiltrate suspicious for pneumonia - D/C'ed vanco and pt on cefepime  - V/Q scan - low probability for  PE -Complete course of antibiotics  Acute on chronic diastolic CHF - Per cardio, continue Lasix 40 mg IV 2 times daily - Monitor daily weight and strict intake and output - Cardio following - ECHO showed preserved EF -Continue IV Lasix as per cardiology.  Patient's Lasix has been increased to 40 mg 3 times daily.  Right-sided chest pain - Patient has prior history of pulmonary embolism. - Patient has history of prostate and renal cancer. - Patient recently underwent ablation by the cardiology team for atrial fibrillation. - Continue heparin drip for now -02/05/2018: Transitioned heparin drip back to Eliquis. -Treat pneumonia.  Acute kidney injury -AKI is improving with diuresis. -Possible cardiorenal component to AKI. -Continue to monitor renal function and electrolytes.   - Follow up BMP in am  Paroxysmal atrial fibrillation - Continue amiodarone 200 mg daily - Continue Cardizem 240 mg daily, metoprolol 12.5 mg twice a day -Heparin Drip will be transitioned back to Eliquis.  Hypertension, essential - Continue Cardizem and metoprolol   Hypothyroidism - Continue synthroid 150 mcg daily   Morbid obesity/OSA - Patient is not compliant with CPAP  Anxiety and depression - Continue xanax and lexapro - Continue Pamelor    DVT prophylaxis: Heparin drip will be transitioned to Eliquis. Code Status: full code  Family Communication: Wife. Disposition Plan: home once pneumonia resolves    Consultants:   Cardiology, Dr. Fransico Him   Procedures:   ECHO - EF 55%  Antimicrobials:   Cefepime 8/8 -->   Subjective: Patient is slowly improving. No fever or chills. Right sided chest pain is slowly improving  Objective: Vitals:   02/05/18 0333 02/05/18 0635 02/05/18 0749 02/05/18 1236  BP: (!) 108/56  124/65 118/80  Pulse:   85 81  Resp:   20 16  Temp: 98.7 F (37.1 C)  98.3 F (36.8 C) 98.7 F (37.1 C)  TempSrc: Oral  Oral Oral  SpO2: 90%  95% 95%   Weight:  133.6 kg    Height:        Intake/Output Summary (Last 24 hours) at 02/05/2018 1429 Last data filed at 02/05/2018 0815 Gross per 24 hour  Intake 156 ml  Output 1600 ml  Net -1444 ml   Filed Weights   02/02/18 1724 02/03/18 0500 02/05/18 0635  Weight: 132.5 kg 134.8 kg 133.6 kg    Examination:  General exam: Appears calm and comfortable  Respiratory system: Crackles bibasally. Diminished breath sounds. Mild expiratory wheeze.  Cardiovascular system: S1 & S2 heard  Gastrointestinal system: Abdomen is nondistended, soft and nontender. Organs are difficult to assess. Central nervous system: Alert and oriented. No focal neurological deficits. Extremities: Bilateral leg edema seems to be improving.   Data Reviewed: I have personally reviewed following labs and imaging studies  CBC: Recent Labs  Lab 02/02/18 2018 02/03/18 0902 02/04/18 0730 02/05/18 0423  WBC 16.6* 14.1* 13.5* 13.0*  NEUTROABS 12.4*  --  9.5*  --   HGB 13.1 13.2 12.5* 12.4*  HCT 39.7 41.3 38.6* 38.0*  MCV 91.1 92.4 92.1 90.9  PLT 307 338 319 660   Basic Metabolic Panel: Recent Labs  Lab 02/02/18 1727 02/03/18 0902 02/04/18 0730 02/05/18 0423  NA 131* 132* 134* 132*  K 3.9 4.1 3.9 3.8  CL 96* 96* 96* 97*  CO2 25 22 27 22   GLUCOSE 97 135* 101* 98  BUN 13 16 15 15   CREATININE 1.35* 1.64* 1.55* 1.49*  CALCIUM 8.4* 8.7* 8.7* 8.3*  MG  --   --  2.1  --   PHOS  --   --  3.4  --    GFR: Estimated Creatinine Clearance: 59.7 mL/min (A) (by C-G formula based on SCr of 1.49 mg/dL (H)). Liver Function Tests: Recent Labs  Lab 02/04/18 0730  ALBUMIN 2.8*   No results for input(s): LIPASE, AMYLASE in the last 168 hours. No results for input(s): AMMONIA in the last 168 hours. Coagulation Profile: Recent Labs  Lab 02/03/18 0434  INR 1.20   Cardiac Enzymes: Recent Labs  Lab 02/03/18 0351 02/03/18 0902 02/03/18 1720  TROPONINI 0.03* 0.03* <0.03   BNP (last 3 results) No results for  input(s): PROBNP in the last 8760 hours. HbA1C: No results for input(s): HGBA1C in the last 72 hours. CBG: Recent Labs  Lab 02/02/18 1811  GLUCAP 85   Lipid Profile: No results for input(s): CHOL, HDL, LDLCALC, TRIG, CHOLHDL, LDLDIRECT in the last 72 hours. Thyroid Function Tests: No results for input(s): TSH, T4TOTAL, FREET4, T3FREE, THYROIDAB in the last 72 hours. Anemia Panel: No results for input(s): VITAMINB12, FOLATE, FERRITIN, TIBC, IRON, RETICCTPCT in the last 72 hours. Urine analysis:    Component Value Date/Time   COLORURINE AMBER (A) 02/03/2018 1620   APPEARANCEUR CLEAR 02/03/2018 1620   LABSPEC 1.016 02/03/2018 1620   PHURINE 5.0 02/03/2018 1620   GLUCOSEU NEGATIVE 02/03/2018 1620   HGBUR NEGATIVE 02/03/2018 1620   HGBUR 2+ 09/22/2007 1444   BILIRUBINUR NEGATIVE 02/03/2018 1620   BILIRUBINUR n 11/07/2015 1417   KETONESUR NEGATIVE 02/03/2018 1620   PROTEINUR NEGATIVE 02/03/2018 1620   UROBILINOGEN 1.0 11/07/2015 1417  UROBILINOGEN 1.0 05/22/2011 1454   NITRITE NEGATIVE 02/03/2018 1620   LEUKOCYTESUR NEGATIVE 02/03/2018 1620   Sepsis Labs: @LABRCNTIP (procalcitonin:4,lacticidven:4)   ) Recent Results (from the past 240 hour(s))  Culture, blood (routine x 2)     Status: None (Preliminary result)   Collection Time: 02/02/18  5:27 PM  Result Value Ref Range Status   Specimen Description BLOOD BLOOD LEFT FOREARM  Final   Special Requests   Final    BOTTLES DRAWN AEROBIC AND ANAEROBIC Blood Culture results may not be optimal due to an inadequate volume of blood received in culture bottles   Culture   Final    NO GROWTH 3 DAYS Performed at [facility], [place] [address], [place], [place] [address]    Report Status PENDING  Incomplete  MRSA PCR Screening     Status: None   Collection Time: 02/03/18  2:44 AM  Result Value Ref Range Status   MRSA by PCR NEGATIVE NEGATIVE Final    Comment:        The GeneXpert MRSA Assay (FDA approved for NASAL  specimens only), is one component of a comprehensive MRSA colonization surveillance program. It is not intended to diagnose MRSA infection nor to guide or monitor treatment for MRSA infections. Performed at [facility], [place] [address] [place], [place] [address]   Culture, sputum-assessment     Status: None   Collection Time: 02/03/18  5:08 PM  Result Value Ref Range Status   Specimen Description EXPECTORATED SPUTUM  Final   Special Requests NONE  Final   Sputum evaluation   Final    THIS SPECIMEN IS ACCEPTABLE FOR SPUTUM CULTURE Performed at [facility], [address] [place], [place] [address]    Report Status 02/03/2018 FINAL  Final  Culture, respiratory     Status: None (Preliminary result)   Collection Time: 02/03/18  5:08 PM  Result Value Ref Range Status   Specimen Description EXPECTORATED SPUTUM  Final   Special Requests NONE Reflexed from [id number]  Final   Gram Stain   Final    ABUNDANT WBC PRESENT, PREDOMINANTLY PMN MODERATE GRAM POSITIVE COCCI    Culture   Final    CULTURE REINCUBATED FOR BETTER GROWTH Performed at [facility], [place] [address], [place],  [address]    Report Status PENDING  Incomplete      Radiology Studies: Ct Chest Wo Contrast  Result Date: 02/03/2018 CLINICAL DATA:  Dyspnea and right-sided chest pain with fever. EXAM: CT CHEST WITHOUT CONTRAST TECHNIQUE: Multidetector CT imaging of the chest was performed following the standard protocol without IV contrast. COMPARISON:  Chest CT 10/20/2016, CXR 02/03/2018 FINDINGS: Cardiovascular: Cardiomegaly with trace pericardial effusion measuring up to 8 mm anteriorly. Scattered coronary arteriosclerosis is identified along the LAD. Aortic atherosclerosis is noted at the root and aortic arch. No aneurysm is visualized. The unenhanced pulmonary vasculature is unremarkable. Mediastinum/Nodes: Mediastinal lipomatosis. Mild enlargement of right lower paratracheal lymph node to 15 mm  short axis with aortic O pulmonary window lymph node measuring up to 11 mm short axis. Smaller superior mediastinal lymph nodes are present. These are likely reactive. Trachea and mainstem bronchi appear patent. Esophagus is unremarkable. No thyromegaly or mass. Lungs/Pleura: Patchy bibasilar, right middle and right upper lobe airspace opacities with air bronchograms consistent with multilobar pneumonia. Small bilateral pleural effusions left greater than right are noted compatible with small parapneumonic effusions. Adjacent atelectasis is seen. No pneumothorax is noted. Upper Abdomen: The patient  is status post left nephrectomy. Stable appearance of the retroperitoneum on the left. Soft tissue structure in the left upper quadrant may represent partial volume averaging of the spleen with displacement due to nephrectomy. Surgically absent gallbladder. Mild steatosis of liver. Musculoskeletal: Osteophytosis of the thoracic spine with exaggerated kyphosis. Chronic posterior right eleventh and anterolateral fourth rib fractures. No acute osseous abnormality. IMPRESSION: 1. Bilateral multilobar pneumonia involving the right upper, middle and both lower lobes with air bronchograms and reactive adenopathy. Small parapneumonic effusions are noted bilaterally. 2. Coronary arteriosclerosis and aortic atherosclerosis. 3. Mediastinal lipomatosis. 4. Hepatic steatosis. 5. Status post cholecystectomy and left nephrectomy. Aortic Atherosclerosis (ICD10-I70.0). Electronically Signed   By: Ashley Royalty M.D.   On: 02/03/2018 18:28   Nm Pulmonary Perf And Vent  Result Date: 02/03/2018 CLINICAL DATA:  Pulmonary embolism EXAM: NUCLEAR MEDICINE VENTILATION - PERFUSION LUNG SCAN TECHNIQUE: Ventilation images were obtained in multiple projections using inhaled aerosol Tc-40m DTPA. Perfusion images were obtained in multiple projections after intravenous injection of Tc-43m-MAA. RADIOPHARMACEUTICALS:  32.5 mCi of Tc-42m DTPA aerosol  inhalation and 3.7 mCi Tc78m-MAA IV COMPARISON:  11/02/2017 Correlation: Chest radiograph 02/03/2018 FINDINGS: Ventilation: Central airway deposition of aerosol. Ventilation defects identified in lateral RIGHT mid lung and throughout entire LEFT lower lobe. Perfusion: Much better perfusion and ventilation in the lateral RIGHT mid lung. Perfusion to the LEFT lower lobe much better than ventilation. Remaining perfusion pattern is normal. Perfusion abnormality in LEFT lower lobe is new since the previous exam. Chest radiograph: Enlargement of cardiac silhouette with atelectasis versus consolidation in LEFT lower lobe as well as mild atelectasis in RIGHT mid lung and at RIGHT base IMPRESSION: Much better perfusion than ventilation in lateral RIGHT mid lung and throughout LEFT lower lobe, with remaining perfusion normal. Findings represent a low probability for pulmonary embolism. Electronically Signed   By: Lavonia Dana M.D.   On: 02/03/2018 16:43   Dg Chest Port 1 View  Result Date: 02/03/2018 CLINICAL DATA:  Shortness of breath EXAM: PORTABLE CHEST 1 VIEW COMPARISON:  02/02/2018 FINDINGS: Shallow lung inflation with unchanged cardiomegaly. Mild right mid lung opacities are unchanged. Small left pleural effusion with associated atelectasis. IMPRESSION: Unchanged chest radiograph with mild right midlung opacity and small left pleural effusion. Electronically Signed   By: Ulyses Jarred M.D.   On: 02/03/2018 04:16   Dg Chest Port 1 View  Result Date: 02/02/2018 CLINICAL DATA:  Shortness of breath and RIGHT chest pain. Recent atrial fibrillation. EXAM: PORTABLE CHEST 1 VIEW COMPARISON:  Chest radiograph January 31, 2018 FINDINGS: Similar cardiomegaly. Calcified aortic arch. Pulmonary vasculature congestion and interstitial prominence with RIGHT mid lung zone consolidation. Small bilateral pleural effusions. No pneumothorax. No pneumothorax. Soft tissue planes and included osseous structures are non suspicious.  IMPRESSION: 1. New RIGHT mid lung zone consolidation concerning for pneumonia versus confluent edema. 2. Stable cardiomegaly and interstitial edema with small pleural effusions. Electronically Signed   By: Elon Alas M.D.   On: 02/02/2018 17:58        Scheduled Meds: . allopurinol  150 mg Oral Daily  . amiodarone  200 mg Oral Daily  . budesonide (PULMICORT) nebulizer solution  0.25 mg Nebulization BID  . colchicine  0.6 mg Oral Daily  . diltiazem  240 mg Oral Daily  . escitalopram  20 mg Oral Daily  . furosemide  40 mg Intravenous Q8H  . ipratropium-albuterol  3 mL Nebulization Q6H  . levothyroxine  150 mcg Oral QAC breakfast  . mouth rinse  15  mL Mouth Rinse BID  . metoprolol tartrate  12.5 mg Oral BID  . multivitamin with minerals  1 tablet Oral Daily  . nortriptyline  75 mg Oral QHS   Continuous Infusions: . ceFEPime (MAXIPIME) IV 1 g (02/05/18 1329)  . heparin 2,700 Units/hr (02/05/18 0745)     LOS: 3 days    Time spent: 35 minutes     Bonnell Public, MD [facility] Pager [phone]  If 7PM-7AM, please contact night-coverage [email or web address] Password [email or web address] 02/05/2018, 2:29 PM

## 2018-02-05 NOTE — Progress Notes (Deleted)
ANTICOAGULATION CONSULT NOTE  Pharmacy Consult for Heparin Indication: atrial fibrillation/hx PE  No Known Allergies  Patient Measurements: Height: 5\' 11"  (180.3 cm) Weight: 294 lb 8.6 oz (133.6 kg) IBW/kg (Calculated) : 75.3 Heparin Dosing Weight: 105 kg  Vital Signs: Temp: 98.7 F (37.1 C) (08/10 1236) Temp Source: Oral (08/10 1236) BP: 118/80 (08/10 1236) Pulse Rate: 81 (08/10 1236)  Labs: Recent Labs    02/03/18 0351 02/03/18 0434 02/03/18 0902  02/03/18 1720  02/04/18 0730 02/04/18 1849 02/05/18 0423 02/05/18 1154  HGB  --   --  13.2  --   --   --  12.5*  --  12.4*  --   HCT  --   --  41.3  --   --   --  38.6*  --  38.0*  --   PLT  --   --  338  --   --   --  319  --  327  --   APTT  --  31  --   --   --   --   --   --   --   --   LABPROT  --  15.1  --   --   --   --   --   --   --   --   INR  --  1.20  --   --   --   --   --   --   --   --   HEPARINUNFRC  --  <0.10*  --    < >  --    < > 0.30 <0.10* 0.30 0.36  CREATININE  --   --  1.64*  --   --   --  1.55*  --  1.49*  --   TROPONINI 0.03*  --  0.03*  --  <0.03  --   --   --   --   --    < > = values in this interval not displayed.    Estimated Creatinine Clearance: 59.7 mL/min (A) (by C-G formula based on SCr of 1.49 mg/dL (H)).   Medical History: Past Medical History:  Diagnosis Date  . Allergy    States his nose runs when he is around grease.   Marland Kitchen Anxiety   . Arthritis   . Atrial fibrillation ([place])   . Cancer ([place])   . Coronary artery disease   . History of total knee replacement   . Hypertension   . Hypothyroidism   . Kidney cancer, primary, with metastasis from kidney to other site [facility])   . Kidney stone   . OSA (obstructive sleep apnea)    pt does not wear cpap at night  . Pituitary cyst ([place])   . Pneumonia    hx of  . Prostate cancer ([place])     Medications:  Medications Prior to Admission  Medication Sig Dispense Refill Last Dose  . albuterol (PROVENTIL HFA;VENTOLIN HFA) 108 (90 Base)  MCG/ACT inhaler Inhale 2 puffs into the lungs every 6 (six) hours as needed for wheezing or shortness of breath. 1 Inhaler 5 unk  . allopurinol (ZYLOPRIM) 300 MG tablet Take 0.5 tablets (150 mg total) by mouth daily. 30 tablet 3 02/02/2018 at 1200  . amiodarone (PACERONE) 200 MG tablet Take 1 tablet (200 mg total) by mouth daily. 90 tablet 3 02/02/2018 at 1200  . apixaban (ELIQUIS) 5 MG TABS tablet Take 1 tablet (5 mg total) by mouth 2 (two) times daily. [place]  tablet 1 02/02/2018 at 1200  . budesonide-formoterol (SYMBICORT) 160-4.5 MCG/ACT inhaler Inhale 2 puffs into the lungs 2 (two) times daily as needed (for respiratory flares).   unk  . clotrimazole-betamethasone (LOTRISONE) cream Apply 1 application topically 2 (two) times daily. Use for 10 days maximum. (Patient taking differently: Apply 1 application topically 2 (two) times daily as needed (for rashes). ) 45 g 1 unk  . colchicine 0.6 MG tablet Take 1 tablet (0.6 mg total) by mouth daily. 30 tablet 3 02/02/2018 at Unknown time  . cyclobenzaprine (FLEXERIL) 10 MG tablet Take 10 mg by mouth daily as needed for muscle spasms.    02/02/2018 at Unknown time  . diltiazem (CARDIZEM CD) 240 MG 24 hr capsule Take 1 capsule (240 mg total) by mouth daily. 30 capsule 3 02/02/2018 at Unknown time  . escitalopram (LEXAPRO) 20 MG tablet TAKE ONE TABLET BY MOUTH DAILY 90 tablet 3 02/02/2018 at Unknown time  . furosemide (LASIX) 20 MG tablet Take 1 tablet (20 mg total) by mouth daily. 30 tablet 3 02/02/2018 at Unknown time  . levothyroxine (SYNTHROID, LEVOTHROID) 150 MCG tablet TAKE ONE TABLET BY MOUTH DAILY 90 tablet 2 02/02/2018 at Unknown time  . metoprolol tartrate (LOPRESSOR) 25 MG tablet Take 0.5 tablets (12.5 mg total) by mouth 2 (two) times daily. 60 tablet 3 02/02/2018 at 1200  . Multiple Vitamins-Minerals (MULTIVITAMINS THER. W/MINERALS) TABS Take 1 tablet by mouth daily.   02/02/2018 at Unknown time  . nitroGLYCERIN (NITROSTAT) 0.4 MG SL tablet Place 1 tablet (0.4 mg total)  under the tongue every 5 (five) minutes as needed for chest pain. 30 tablet 12 02/02/2018 at Unknown time  . nortriptyline (PAMELOR) 75 MG capsule TAKE ONE CAPSULE BY MOUTH AT BEDTIME 90 capsule 1 02/01/2018 at Unknown time  . Suvorexant (BELSOMRA) 10 MG TABS Take 10 mg by mouth at bedtime. 30 tablet 5 02/01/2018 at Unknown time    Assessment: 77 y.o. male admitted with SOB/CHF, h/o Afib. Eliquis on hold. D-dimer was done on 8/8 and came back elevated at 1.58.  Pharmacy was consulted for heparin on 8/8.    8/10: Heparin level returned 0.30 at 0423. Heparin rate increased to 2700 units / hr at 0600. Heparin level returned 0.36 at 1154.  Heparin levels therapeutic x2. No bleeding noted. CBC fairly stable.  Goal of Therapy:  Heparin level 0.3-0.7 units/ml Monitor platelets by anticoagulation protocol: Yes   Plan:  -Continue heparin to 2700 units/hr -Monitor daily heparin level, CBC and s/sx bleeding.  Thank you for allowing pharmacy to be a part of this patient's care.  Tamela Gammon, PharmD PGY1 Pharmacy Resident Direct Phone: [phone] 02/05/2018 2:11 PM

## 2018-02-05 NOTE — Progress Notes (Signed)
ANTICOAGULATION CONSULT NOTE - Initial Consult  Pharmacy Consult for Apixaban Indication: atrial fibrillation  No Known Allergies  Patient Measurements: Height: 5\' 11"  (180.3 cm) Weight: 294 lb 8.6 oz (133.6 kg) IBW/kg (Calculated) : 75.3   Vital Signs: Temp: 98.7 F (37.1 C) (08/10 1236) Temp Source: Oral (08/10 1236) BP: 118/80 (08/10 1236) Pulse Rate: 81 (08/10 1236)  Labs: Recent Labs    02/03/18 0351 02/03/18 0434 02/03/18 0902  02/03/18 1720  02/04/18 0730 02/04/18 1849 02/05/18 0423 02/05/18 1154  HGB  --   --  13.2  --   --   --  12.5*  --  12.4*  --   HCT  --   --  41.3  --   --   --  38.6*  --  38.0*  --   PLT  --   --  338  --   --   --  319  --  327  --   APTT  --  31  --   --   --   --   --   --   --   --   LABPROT  --  15.1  --   --   --   --   --   --   --   --   INR  --  1.20  --   --   --   --   --   --   --   --   HEPARINUNFRC  --  <0.10*  --    < >  --    < > 0.30 <0.10* 0.30 0.36  CREATININE  --   --  1.64*  --   --   --  1.55*  --  1.49*  --   TROPONINI 0.03*  --  0.03*  --  <0.03  --   --   --   --   --    < > = values in this interval not displayed.    Estimated Creatinine Clearance: 59.7 mL/min (A) (by C-G formula based on SCr of 1.49 mg/dL (H)).   Medical History: Past Medical History:  Diagnosis Date  . Allergy    States his nose runs when he is around grease.   Marland Kitchen Anxiety   . Arthritis   . Atrial fibrillation ([place])   . Cancer ([place])   . Coronary artery disease   . History of total knee replacement   . Hypertension   . Hypothyroidism   . Kidney cancer, primary, with metastasis from kidney to other site [facility])   . Kidney stone   . OSA (obstructive sleep apnea)    pt does not wear cpap at night  . Pituitary cyst ([place])   . Pneumonia    hx of  . Prostate cancer ([place])     Medications:  Scheduled:  . allopurinol  150 mg Oral Daily  . amiodarone  200 mg Oral Daily  . budesonide (PULMICORT) nebulizer solution  0.25 mg  Nebulization BID  . colchicine  0.6 mg Oral Daily  . diltiazem  240 mg Oral Daily  . escitalopram  20 mg Oral Daily  . furosemide  40 mg Intravenous Q8H  . ipratropium-albuterol  3 mL Nebulization Q6H  . levothyroxine  150 mcg Oral QAC breakfast  . mouth rinse  15 mL Mouth Rinse BID  . metoprolol tartrate  12.5 mg Oral BID  . multivitamin with minerals  1 tablet Oral Daily  . nortriptyline  75  mg Oral QHS    Assessment: 77 y.o. male admitted with SOB/CHF, h/o Afib Eliquis previously on hold. Patient was established on heparin. Pharmacy consulted to transition to Eliquis from heparin.    Goal of Therapy:  Anticoagulation   Plan:  Stop heparin infusion. Start Eliquis 5mg  PO twice daily Monitor CBC, signs and symptoms of bleeding.  Thank you for allowing pharmacy to be a part of this patient's care.  Tamela Gammon, PharmD PGY1 Pharmacy Resident Direct Phone: [phone] 02/05/2018 2:37 PM

## 2018-02-05 NOTE — Progress Notes (Signed)
Left lower extremity venous duplex completed - Preliminary results - There is no obvious evidence of a DVT or Baker's cyst. There is however an area of mixed echoes in the groin which does not appear to be vascular in nature. Etiology unknown, May required further investigation. [facility], [place] 02/05/2018 3:16 PM

## 2018-02-05 NOTE — Progress Notes (Addendum)
Progress Note  Patient Name: Wayne Booth Date of Encounter: 02/05/2018  Primary Cardiologist: Larae Grooms, MD   Subjective   Denies any chest pain.  Feels SOB is worse today and now complaining of LLE pain  Inpatient Medications    Scheduled Meds: . allopurinol  150 mg Oral Daily  . amiodarone  200 mg Oral Daily  . colchicine  0.6 mg Oral Daily  . diltiazem  240 mg Oral Daily  . escitalopram  20 mg Oral Daily  . levothyroxine  150 mcg Oral QAC breakfast  . mouth rinse  15 mL Mouth Rinse BID  . metoprolol tartrate  12.5 mg Oral BID  . mometasone-formoterol  2 puff Inhalation BID  . multivitamin with minerals  1 tablet Oral Daily  . nortriptyline  75 mg Oral QHS   Continuous Infusions: . ceFEPime (MAXIPIME) IV 1 g (02/05/18 0525)  . heparin 2,700 Units/hr (02/05/18 0745)   PRN Meds: acetaminophen **OR** acetaminophen, ALPRAZolam, nitroGLYCERIN, ondansetron **OR** ondansetron (ZOFRAN) IV, zolpidem   Vital Signs    Vitals:   02/05/18 0000 02/05/18 0333 02/05/18 0635 02/05/18 0749  BP: 113/64 (!) 108/56  124/65  Pulse: 91   85  Resp:    20  Temp: 98.9 F (37.2 C) 98.7 F (37.1 C)  98.3 F (36.8 C)  TempSrc: Oral Oral  Oral  SpO2: 95% 90%  95%  Weight:   133.6 kg   Height:        Intake/Output Summary (Last 24 hours) at 02/05/2018 1030 Last data filed at 02/05/2018 0815 Gross per 24 hour  Intake 156 ml  Output 2800 ml  Net -2644 ml   Filed Weights   02/02/18 1724 02/03/18 0500 02/05/18 0635  Weight: 132.5 kg 134.8 kg 133.6 kg    Telemetry    NSR- Personally Reviewed  ECG    No new EKG to review- Personally Reviewed  Physical Exam   GEN: Well nourished, well developed in no acute distress HEENT: Normal NECK: No JVD; No carotid bruits LYMPHATICS: No lymphadenopathy CARDIAC:RRR, no murmurs, rubs, gallops RESPIRATORY:  Crackles 2/3 was up on right and basilar crackles on left, diffuse expiratory wheezes ABDOMEN: Soft, non-tender,  non-distended MUSCULOSKELETAL: 1+  LLE edema; No deformity  SKIN: Warm and dry NEUROLOGIC:  Alert and oriented x 3 PSYCHIATRIC:  Normal affect    Labs    Chemistry Recent Labs  Lab 02/03/18 0902 02/04/18 0730 02/05/18 0423  NA 132* 134* 132*  K 4.1 3.9 3.8  CL 96* 96* 97*  CO2 22 27 22   GLUCOSE 135* 101* 98  BUN 16 15 15   CREATININE 1.64* 1.55* 1.49*  CALCIUM 8.7* 8.7* 8.3*  ALBUMIN  --  2.8*  --   GFRNONAA 39* 42* 44*  GFRAA 46* 49* 51*  ANIONGAP 14 11 13      Hematology Recent Labs  Lab 02/03/18 0902 02/04/18 0730 02/05/18 0423  WBC 14.1* 13.5* 13.0*  RBC 4.47 4.19* 4.18*  HGB 13.2 12.5* 12.4*  HCT 41.3 38.6* 38.0*  MCV 92.4 92.1 90.9  MCH 29.5 29.8 29.7  MCHC 32.0 32.4 32.6  RDW 14.6 14.6 14.2  PLT 338 319 327    Cardiac Enzymes Recent Labs  Lab 02/03/18 0351 02/03/18 0902 02/03/18 1720  TROPONINI 0.03* 0.03* <0.03    Recent Labs  Lab 02/02/18 1819  TROPIPOC 0.12*     BNP Recent Labs  Lab 02/02/18 1727  BNP 54.3     DDimer  Recent Labs  Lab 02/03/18  1218  DDIMER 1.58*     Radiology    Ct Chest Wo Contrast  Result Date: 02/03/2018 CLINICAL DATA:  Dyspnea and right-sided chest pain with fever. EXAM: CT CHEST WITHOUT CONTRAST TECHNIQUE: Multidetector CT imaging of the chest was performed following the standard protocol without IV contrast. COMPARISON:  Chest CT 10/20/2016, CXR 02/03/2018 FINDINGS: Cardiovascular: Cardiomegaly with trace pericardial effusion measuring up to 8 mm anteriorly. Scattered coronary arteriosclerosis is identified along the LAD. Aortic atherosclerosis is noted at the root and aortic arch. No aneurysm is visualized. The unenhanced pulmonary vasculature is unremarkable. Mediastinum/Nodes: Mediastinal lipomatosis. Mild enlargement of right lower paratracheal lymph node to 15 mm short axis with aortic O pulmonary window lymph node measuring up to 11 mm short axis. Smaller superior mediastinal lymph nodes are present.  These are likely reactive. Trachea and mainstem bronchi appear patent. Esophagus is unremarkable. No thyromegaly or mass. Lungs/Pleura: Patchy bibasilar, right middle and right upper lobe airspace opacities with air bronchograms consistent with multilobar pneumonia. Small bilateral pleural effusions left greater than right are noted compatible with small parapneumonic effusions. Adjacent atelectasis is seen. No pneumothorax is noted. Upper Abdomen: The patient is status post left nephrectomy. Stable appearance of the retroperitoneum on the left. Soft tissue structure in the left upper quadrant may represent partial volume averaging of the spleen with displacement due to nephrectomy. Surgically absent gallbladder. Mild steatosis of liver. Musculoskeletal: Osteophytosis of the thoracic spine with exaggerated kyphosis. Chronic posterior right eleventh and anterolateral fourth rib fractures. No acute osseous abnormality. IMPRESSION: 1. Bilateral multilobar pneumonia involving the right upper, middle and both lower lobes with air bronchograms and reactive adenopathy. Small parapneumonic effusions are noted bilaterally. 2. Coronary arteriosclerosis and aortic atherosclerosis. 3. Mediastinal lipomatosis. 4. Hepatic steatosis. 5. Status post cholecystectomy and left nephrectomy. Aortic Atherosclerosis (ICD10-I70.0). Electronically Signed   By: Ashley Royalty M.D.   On: 02/03/2018 18:28   Nm Pulmonary Perf And Vent  Result Date: 02/03/2018 CLINICAL DATA:  Pulmonary embolism EXAM: NUCLEAR MEDICINE VENTILATION - PERFUSION LUNG SCAN TECHNIQUE: Ventilation images were obtained in multiple projections using inhaled aerosol Tc-71m DTPA. Perfusion images were obtained in multiple projections after intravenous injection of Tc-76m-MAA. RADIOPHARMACEUTICALS:  32.5 mCi of Tc-82m DTPA aerosol inhalation and 3.7 mCi Tc75m-MAA IV COMPARISON:  11/02/2017 Correlation: Chest radiograph 02/03/2018 FINDINGS: Ventilation: Central airway  deposition of aerosol. Ventilation defects identified in lateral RIGHT mid lung and throughout entire LEFT lower lobe. Perfusion: Much better perfusion and ventilation in the lateral RIGHT mid lung. Perfusion to the LEFT lower lobe much better than ventilation. Remaining perfusion pattern is normal. Perfusion abnormality in LEFT lower lobe is new since the previous exam. Chest radiograph: Enlargement of cardiac silhouette with atelectasis versus consolidation in LEFT lower lobe as well as mild atelectasis in RIGHT mid lung and at RIGHT base IMPRESSION: Much better perfusion than ventilation in lateral RIGHT mid lung and throughout LEFT lower lobe, with remaining perfusion normal. Findings represent a low probability for pulmonary embolism. Electronically Signed   By: Lavonia Dana M.D.   On: 02/03/2018 16:43    Cardiac Studies   2D echo limited 02/02/2018 Study Conclusions  - Pericardium, extracardiac: A trivial pericardial effusion was   identified. No significant echocardiographic features to support   tamponade physiology, however, there is mild variation in TV/MV   inflow noted. The IVC does not appear to collapse, however, this   was not fully evaluated. Clinical correlation with tamponade   features is recommended.  Impressions:  - Trivial pericardial  effusion. No clear tamponade features,   however, this remains a clinical diagnosis.  2D echo 02/04/2018 Study Conclusions  - Left ventricle: The cavity size was normal. Systolic function was   normal. The estimated ejection fraction was in the range of 50%   to 55%. Although no diagnostic regional wall motion abnormality   was identified, this possibility cannot be completely excluded on   the basis of this study.  Impressions:  - Technically difficult study, even with administration of echo   contrast. Grossly normal LV function without clear wall motion   abnormalities. No significant valvular disease.  Patient Profile       77 y.o. male with a history of paroxysmal atrial fibrillation/atrial flutter hypertension, obstructive sleep apnea intolerant to CPAP and hypothyroidism.  Patient had a left heart catheterization done in 2008 which showed normal coronary arteries.  He had surgery for his hand a few weeks later after cath and was diagnosed with a PE and placed on Xarelto.  He was subsequently diagnosed with atrial flutter in May 2019 and was hospitalized and converted to sinus rhythm and discharged on Eliquis.  S/P atrial flutter ablation 01/28/2018.   Was seen back in the A. fib clinic on 01/31/2018 secondary to recurrent shortness of breath as well as right-sided pleuritic chest pain after his ablation.  His weight was up 7 pounds.  O2 sats are 93% on room air in the office.  He was noted to have 2+ pitting edema in the office.  EKG showed maintenance of normal sinus rhythm.  His Lasix was increased to 40 mg daily.  He called the office yesterday because of ongoing shortness of breath and chest pain that was occurring both with exertion and at rest.  He was instructed to go to the emergency room for further assessment.  Assessment & Plan    1.  Shortness of breath/RML PNA/Acute on chronic diastolic CHF -He has had low-grade fevers, leukocytosis and new right middle lobe infiltrate on chest x-ray consistent with pneumonia -afebrile now -BNP normal but could be falsely low due to obesity -Creatinine trending down (1.55>1.49) -VQ scan  for elevated d-dimer was low probability -His baseline weight is usually around 285 pounds but went as high as 300 pounds recently> dropped to 294 pounds after Lasix was increased to 40 mg daily as an outpatient.  Now down to 294lbs.  -Chest x-ray did show small left pleural effusion along with right middle lobe pneumonia -good diuresis with Lasix 40mg  IV BID yesterday with 2.8L UOP and net neg 3.1L -remains volume overloaded based on dry weight of 285lbs (still up 9lbs)  -creatinine  improved after IV Lasix (1.64>1.55>1.49) -He is complaining today of worsening shortness of breath and now has crackles at the left base as well.  His right lung has crackles and expiratory wheezes two thirds the way up consistent with where his pneumonia is on chest x-ray 8/8 -repeat cxray today -increase Lasix to 40mg  IV TID -check LLE venous doppler due to unilateral LE edema with pain to rule out DVT  2.  Paroxysmal atrial fibrillation/flutter -S/P recent a flutter ablation -Xarelto currently on hold and on IV heparin per pharmacy -ok from cardiac standpoint to change back to Xarelto -Continue amiodarone 200 mg daily, Cardizem CD 240 mg daily and Lopressor 12.5 mg twice daily  3.  Pleuritic chest pain -Recent nuclear stress test 12/05/2017 showed questionable small area of reversible ischemia in the apex and mild hypokinesis of the septum likely related to  his incomplete left bundle branch block -Coronary CTA with FFR was ordered as follow-up to his nuclear stress test but has not been completed yet -This pain is right-sided and likely related to his right middle lobe pneumonia as it is pleuritic and does not sound like coronary ischemia -Could also be due to a component of acute pericarditis. -Continue colchicine 0.6 mg twice daily  4.  Minimally elevated troponin at 0.033 -Related to demand ischemia in the setting of pneumonia -Nuclear stress test showed very small defect in the apex -Again his symptoms are not consistent with ACS and more consistent with pleuritic chest pain from his pneumonia - 2D echo with low normal LVF Ef 50-55% . No further inpatient ischemic work-up.    For questions or updates, please contact [place] Please consult [email or web address] for contact info under Cardiology/STEMI.      Signed, Fransico Him, MD  02/05/2018, 10:30 AM

## 2018-02-05 NOTE — Progress Notes (Signed)
ANTICOAGULATION CONSULT NOTE  Pharmacy Consult for Heparin Indication: atrial fibrillation/hx PE  No Known Allergies  Patient Measurements: Height: 5\' 11"  (180.3 cm) Weight: 297 lb 2.9 oz (134.8 kg) IBW/kg (Calculated) : 75.3 Heparin Dosing Weight: 105 kg  Vital Signs: Temp: 98.7 F (37.1 C) (08/10 0333) Temp Source: Oral (08/10 0333) BP: 108/56 (08/10 0333) Pulse Rate: 91 (08/10 0000)  Labs: Recent Labs    02/03/18 0351 02/03/18 0434 02/03/18 0902  02/03/18 1720  02/04/18 0730 02/04/18 1849 02/05/18 0423  HGB  --   --  13.2  --   --   --  12.5*  --  12.4*  HCT  --   --  41.3  --   --   --  38.6*  --  38.0*  PLT  --   --  338  --   --   --  319  --  327  APTT  --  31  --   --   --   --   --   --   --   LABPROT  --  15.1  --   --   --   --   --   --   --   INR  --  1.20  --   --   --   --   --   --   --   HEPARINUNFRC  --  <0.10*  --    < >  --    < > 0.30 <0.10* 0.30  CREATININE  --   --  1.64*  --   --   --  1.55*  --  1.49*  TROPONINI 0.03*  --  0.03*  --  <0.03  --   --   --   --    < > = values in this interval not displayed.    Estimated Creatinine Clearance: 60 mL/min (A) (by C-G formula based on SCr of 1.49 mg/dL (H)).   Medical History: Past Medical History:  Diagnosis Date  . Allergy    States his nose runs when he is around grease.   Marland Kitchen Anxiety   . Arthritis   . Atrial fibrillation ([place])   . Cancer ([place])   . Coronary artery disease   . History of total knee replacement   . Hypertension   . Hypothyroidism   . Kidney cancer, primary, with metastasis from kidney to other site [facility])   . Kidney stone   . OSA (obstructive sleep apnea)    pt does not wear cpap at night  . Pituitary cyst ([place])   . Pneumonia    hx of  . Prostate cancer ([place])     Medications:  Medications Prior to Admission  Medication Sig Dispense Refill Last Dose  . albuterol (PROVENTIL HFA;VENTOLIN HFA) 108 (90 Base) MCG/ACT inhaler Inhale 2 puffs into the lungs every 6 (six)  hours as needed for wheezing or shortness of breath. 1 Inhaler 5 unk  . allopurinol (ZYLOPRIM) 300 MG tablet Take 0.5 tablets (150 mg total) by mouth daily. 30 tablet 3 02/02/2018 at 1200  . amiodarone (PACERONE) 200 MG tablet Take 1 tablet (200 mg total) by mouth daily. 90 tablet 3 02/02/2018 at 1200  . apixaban (ELIQUIS) 5 MG TABS tablet Take 1 tablet (5 mg total) by mouth 2 (two) times daily. 60 tablet 1 02/02/2018 at 1200  . budesonide-formoterol (SYMBICORT) 160-4.5 MCG/ACT inhaler Inhale 2 puffs into the lungs 2 (two) times daily as needed (for respiratory flares).  unk  . clotrimazole-betamethasone (LOTRISONE) cream Apply 1 application topically 2 (two) times daily. Use for 10 days maximum. (Patient taking differently: Apply 1 application topically 2 (two) times daily as needed (for rashes). ) 45 g 1 unk  . colchicine 0.6 MG tablet Take 1 tablet (0.6 mg total) by mouth daily. 30 tablet 3 02/02/2018 at Unknown time  . cyclobenzaprine (FLEXERIL) 10 MG tablet Take 10 mg by mouth daily as needed for muscle spasms.    02/02/2018 at Unknown time  . diltiazem (CARDIZEM CD) 240 MG 24 hr capsule Take 1 capsule (240 mg total) by mouth daily. 30 capsule 3 02/02/2018 at Unknown time  . escitalopram (LEXAPRO) 20 MG tablet TAKE ONE TABLET BY MOUTH DAILY 90 tablet 3 02/02/2018 at Unknown time  . furosemide (LASIX) 20 MG tablet Take 1 tablet (20 mg total) by mouth daily. 30 tablet 3 02/02/2018 at Unknown time  . levothyroxine (SYNTHROID, LEVOTHROID) 150 MCG tablet TAKE ONE TABLET BY MOUTH DAILY 90 tablet 2 02/02/2018 at Unknown time  . metoprolol tartrate (LOPRESSOR) 25 MG tablet Take 0.5 tablets (12.5 mg total) by mouth 2 (two) times daily. 60 tablet 3 02/02/2018 at 1200  . Multiple Vitamins-Minerals (MULTIVITAMINS THER. W/MINERALS) TABS Take 1 tablet by mouth daily.   02/02/2018 at Unknown time  . nitroGLYCERIN (NITROSTAT) 0.4 MG SL tablet Place 1 tablet (0.4 mg total) under the tongue every 5 (five) minutes as needed for chest  pain. 30 tablet 12 02/02/2018 at Unknown time  . nortriptyline (PAMELOR) 75 MG capsule TAKE ONE CAPSULE BY MOUTH AT BEDTIME 90 capsule 1 02/01/2018 at Unknown time  . Suvorexant (BELSOMRA) 10 MG TABS Take 10 mg by mouth at bedtime. 30 tablet 5 02/01/2018 at Unknown time    Assessment: 77 y.o. male admitted with SOB/CHF, h/o Afib Eliquis on hold, for heparin. D-dimer was done and came back elevated at 1.58. Plan is to do CT.  -heparin level at goal after bolus and increase to 2600 units/hr    Goal of Therapy:  Heparin level 0.3-0.7 units/ml Monitor platelets by anticoagulation protocol: Yes   Plan:  -Increase heparin to 2700 units/hr -Heparin level in 6 hours and daily wth CBC daily  Hildred Laser, PharmD Clinical Pharmacist Please check Amion for pharmacy contact number  02/05/2018, 5:58 AM

## 2018-02-06 ENCOUNTER — Inpatient Hospital Stay (HOSPITAL_COMMUNITY): Payer: Medicare Other

## 2018-02-06 LAB — RENAL FUNCTION PANEL
Albumin: 2.5 g/dL — ABNORMAL LOW (ref 3.5–5.0)
Anion gap: 10 (ref 5–15)
BUN: 18 mg/dL (ref 8–23)
CO2: 28 mmol/L (ref 22–32)
Calcium: 8.3 mg/dL — ABNORMAL LOW (ref 8.9–10.3)
Chloride: 94 mmol/L — ABNORMAL LOW (ref 98–111)
Creatinine, Ser: 1.56 mg/dL — ABNORMAL HIGH (ref 0.61–1.24)
GFR calc Af Amer: 48 mL/min — ABNORMAL LOW (ref >60)
GFR calc non Af Amer: 42 mL/min — ABNORMAL LOW (ref >60)
Glucose, Bld: 116 mg/dL — ABNORMAL HIGH (ref 70–99)
Phosphorus: 3.7 mg/dL (ref 2.5–4.6)
Potassium: 3.2 mmol/L — ABNORMAL LOW (ref 3.5–5.1)
Sodium: 132 mmol/L — ABNORMAL LOW (ref 135–145)

## 2018-02-06 LAB — CBC WITH DIFFERENTIAL/PLATELET
Abs Immature Granulocytes: 0.3 10*3/uL — ABNORMAL HIGH (ref 0.0–0.1)
Basophils Absolute: 0.1 10*3/uL (ref 0.0–0.1)
Basophils Relative: 1 %
Eosinophils Absolute: 0.8 10*3/uL — ABNORMAL HIGH (ref 0.0–0.7)
Eosinophils Relative: 6 %
HCT: 36.1 % — ABNORMAL LOW (ref 39.0–52.0)
Hemoglobin: 11.8 g/dL — ABNORMAL LOW (ref 13.0–17.0)
Immature Granulocytes: 2 %
Lymphocytes Relative: 14 %
Lymphs Abs: 1.8 10*3/uL (ref 0.7–4.0)
MCH: 29.6 pg (ref 26.0–34.0)
MCHC: 32.7 g/dL (ref 30.0–36.0)
MCV: 90.7 fL (ref 78.0–100.0)
Monocytes Absolute: 1.7 10*3/uL — ABNORMAL HIGH (ref 0.1–1.0)
Monocytes Relative: 13 %
Neutro Abs: 8.2 10*3/uL — ABNORMAL HIGH (ref 1.7–7.7)
Neutrophils Relative %: 64 %
Platelets: 338 10*3/uL (ref 150–400)
RBC: 3.98 MIL/uL — ABNORMAL LOW (ref 4.22–5.81)
RDW: 14.3 % (ref 11.5–15.5)
WBC: 12.8 10*3/uL — ABNORMAL HIGH (ref 4.0–10.5)

## 2018-02-06 LAB — BLOOD GAS, ARTERIAL
ACID-BASE EXCESS: 5.3 mmol/L — AB (ref 0.0–2.0)
BICARBONATE: 29.3 mmol/L — AB (ref 20.0–28.0)
Drawn by: 406621
O2 CONTENT: 5 L/min
O2 SAT: 93 %
PCO2 ART: 43.4 mmHg (ref 32.0–48.0)
PO2 ART: 65.5 mmHg — AB (ref 83.0–108.0)
Patient temperature: 98.6
pH, Arterial: 7.444 (ref 7.350–7.450)

## 2018-02-06 LAB — MAGNESIUM
MAGNESIUM: 2 mg/dL (ref 1.7–2.4)
Magnesium: 2 mg/dL (ref 1.7–2.4)

## 2018-02-06 LAB — CULTURE, RESPIRATORY W GRAM STAIN: Culture: NORMAL

## 2018-02-06 LAB — CULTURE, RESPIRATORY

## 2018-02-06 MED ORDER — FUROSEMIDE 10 MG/ML IJ SOLN
80.0000 mg | Freq: Two times a day (BID) | INTRAMUSCULAR | Status: DC
  Administered 2018-02-06 – 2018-02-08 (×4): 80 mg via INTRAVENOUS
  Filled 2018-02-06 (×4): qty 8

## 2018-02-06 MED ORDER — SODIUM CHLORIDE 0.9 % IV SOLN
500.0000 mg | INTRAVENOUS | Status: DC
  Administered 2018-02-06 – 2018-02-11 (×6): 500 mg via INTRAVENOUS
  Filled 2018-02-06 (×7): qty 500

## 2018-02-06 MED ORDER — POTASSIUM CHLORIDE CRYS ER 20 MEQ PO TBCR
40.0000 meq | EXTENDED_RELEASE_TABLET | Freq: Two times a day (BID) | ORAL | Status: AC
  Administered 2018-02-06 (×2): 40 meq via ORAL
  Filled 2018-02-06 (×2): qty 2

## 2018-02-06 NOTE — Progress Notes (Signed)
Progress Note  Patient Name: Wayne Booth Date of Encounter: 02/06/2018  Primary Cardiologist: Larae Grooms, MD   Subjective   Very weak and still SOB.  No CP.  LE venous doppler prelim with no DVT.  Cxray with multilobar PNA.  O2 sats 97% on 4L  Inpatient Medications    Scheduled Meds: . allopurinol  150 mg Oral Daily  . amiodarone  200 mg Oral Daily  . apixaban  5 mg Oral BID  . budesonide (PULMICORT) nebulizer solution  0.25 mg Nebulization BID  . colchicine  0.6 mg Oral Daily  . diltiazem  240 mg Oral Daily  . escitalopram  20 mg Oral Daily  . furosemide  40 mg Intravenous Q8H  . ipratropium-albuterol  3 mL Nebulization Q6H  . levothyroxine  150 mcg Oral QAC breakfast  . mouth rinse  15 mL Mouth Rinse BID  . metoprolol tartrate  12.5 mg Oral BID  . multivitamin with minerals  1 tablet Oral Daily  . nortriptyline  75 mg Oral QHS  . potassium chloride  40 mEq Oral BID   Continuous Infusions: . azithromycin 500 mg (02/06/18 1110)  . ceFEPime (MAXIPIME) IV 1 g (02/06/18 0615)   PRN Meds: acetaminophen **OR** acetaminophen, ALPRAZolam, nitroGLYCERIN, ondansetron **OR** ondansetron (ZOFRAN) IV, zolpidem   Vital Signs    Vitals:   02/06/18 0322 02/06/18 0551 02/06/18 0730 02/06/18 0943  BP: 121/72  123/66   Pulse: 79     Resp: (!) 24  18   Temp: 98.7 F (37.1 C)  98.3 F (36.8 C)   TempSrc: Oral  Oral   SpO2: 92%  99% 97%  Weight:  133.4 kg    Height:        Intake/Output Summary (Last 24 hours) at 02/06/2018 1115 Last data filed at 02/06/2018 0900 Gross per 24 hour  Intake 540 ml  Output 3625 ml  Net -3085 ml   Filed Weights   02/03/18 0500 02/05/18 0635 02/06/18 0551  Weight: 134.8 kg 133.6 kg 133.4 kg    Telemetry    NSR -  Personally Reviewed  ECG    No new EKG to review- Personally Reviewed  Physical Exam   GEN: Well nourished, well developed in mild respiratory distress HEENT: Normal NECK: No JVD; No carotid bruits LYMPHATICS:  No lymphadenopathy CARDIAC:RRR, no murmurs, rubs, gallops RESPIRATORY:  Crackles at bases ABDOMEN: Soft, non-tender, non-distended MUSCULOSKELETAL:  1-2+ LE edema; No deformity  SKIN: Warm and dry NEUROLOGIC:  Alert and oriented x 3 PSYCHIATRIC:  Normal affect    Labs    Chemistry Recent Labs  Lab 02/04/18 0730 02/05/18 0423 02/06/18 0340  NA 134* 132* 132*  K 3.9 3.8 3.2*  CL 96* 97* 94*  CO2 27 22 28   GLUCOSE 101* 98 116*  BUN 15 15 18   CREATININE 1.55* 1.49* 1.56*  CALCIUM 8.7* 8.3* 8.3*  ALBUMIN 2.8*  --  2.5*  GFRNONAA 42* 44* 42*  GFRAA 49* 51* 48*  ANIONGAP 11 13 10      Hematology Recent Labs  Lab 02/04/18 0730 02/05/18 0423 02/06/18 0340  WBC 13.5* 13.0* 12.8*  RBC 4.19* 4.18* 3.98*  HGB 12.5* 12.4* 11.8*  HCT 38.6* 38.0* 36.1*  MCV 92.1 90.9 90.7  MCH 29.8 29.7 29.6  MCHC 32.4 32.6 32.7  RDW 14.6 14.2 14.3  PLT 319 327 338    Cardiac Enzymes Recent Labs  Lab 02/03/18 0351 02/03/18 0902 02/03/18 1720  TROPONINI 0.03* 0.03* <0.03    Recent  Labs  Lab 02/02/18 1819  TROPIPOC 0.12*     BNP Recent Labs  Lab 02/02/18 1727  BNP 54.3     DDimer  Recent Labs  Lab 02/03/18 1218  DDIMER 1.58*     Radiology    Dg Chest 2 View  Result Date: 02/05/2018 CLINICAL DATA:  Short of breath. Mid chest pain for a couple of days. EXAM: CHEST - 2 VIEW COMPARISON:  02/03/2018 and older exams. FINDINGS: Hazy airspace consolidation is noted on the right predominantly in the mid lung. There is basilar consolidation, greater on the left. These findings are stable from the previous exam and previous chest CT. No new lung abnormalities. Heart is normal in size. No convincing mediastinal or hilar masses. Small effusion seen on the prior CT are not well-defined radiographically. They are likely unchanged. No pneumothorax. IMPRESSION: 1. No significant change in lung aeration from the most recent prior exams. Bilateral airspace lung opacities consistent with  multifocal pneumonia. Electronically Signed   By: Lajean Manes M.D.   On: 02/05/2018 18:33   Dg Chest Port 1 View  Result Date: 02/06/2018 CLINICAL DATA:  Shortness of breath, right chest pain EXAM: PORTABLE CHEST 1 VIEW COMPARISON:  Chest radiographs dated 02/05/2018. CT chest dated 02/03/2018. FINDINGS: Focal/patchy right midlung opacity. Mild patchy left lower lobe opacity. Mild right basilar opacity. When correlating with prior CT, this likely reflects multifocal pneumonia. Small left pleural effusion. No pneumothorax. Heart is top-normal in size. IMPRESSION: Suspected multifocal pneumonia, right upper lobe predominant, better evaluated on prior CT. Small left pleural effusion. Electronically Signed   By: Julian Hy M.D.   On: 02/06/2018 10:59    Cardiac Studies   2D echo limited 02/02/2018 Study Conclusions  - Pericardium, extracardiac: A trivial pericardial effusion was   identified. No significant echocardiographic features to support   tamponade physiology, however, there is mild variation in TV/MV   inflow noted. The IVC does not appear to collapse, however, this   was not fully evaluated. Clinical correlation with tamponade   features is recommended.  Impressions:  - Trivial pericardial effusion. No clear tamponade features,   however, this remains a clinical diagnosis.  2D echo 02/04/2018 Study Conclusions  - Left ventricle: The cavity size was normal. Systolic function was   normal. The estimated ejection fraction was in the range of 50%   to 55%. Although no diagnostic regional wall motion abnormality   was identified, this possibility cannot be completely excluded on   the basis of this study.  Impressions:  - Technically difficult study, even with administration of echo   contrast. Grossly normal LV function without clear wall motion   abnormalities. No significant valvular disease.  Patient Profile     77 y.o. male with a history of paroxysmal atrial  fibrillation/atrial flutter hypertension, obstructive sleep apnea intolerant to CPAP and hypothyroidism.  Patient had a left heart catheterization done in 2008 which showed normal coronary arteries.  He had surgery for his hand a few weeks later after cath and was diagnosed with a PE and placed on Xarelto.  He was subsequently diagnosed with atrial flutter in May 2019 and was hospitalized and converted to sinus rhythm and discharged on Eliquis.  S/P atrial flutter ablation 01/28/2018.   Was seen back in the A. fib clinic on 01/31/2018 secondary to recurrent shortness of breath as well as right-sided pleuritic chest pain after his ablation.  His weight was up 7 pounds.  O2 sats are 93% on room air  in the office.  He was noted to have 2+ pitting edema in the office.  EKG showed maintenance of normal sinus rhythm.  His Lasix was increased to 40 mg daily.  He called the office yesterday because of ongoing shortness of breath and chest pain that was occurring both with exertion and at rest.  He was instructed to go to the emergency room for further assessment.  Assessment & Plan    1.  Shortness of breath/RML PNA/Acute on chronic diastolic CHF -He has had low-grade fevers, leukocytosis and new right middle lobe infiltrate on chest x-ray consistent with pneumonia -afebrile now -BNP normal but could be falsely low due to obesity -Creatinine stable with diuresis (1.55>1.49>1.56)  -VQ scan  for elevated d-dimer was low probability -His baseline weight is usually around 285 pounds but went as high as 300 pounds recently> dropped to 294 pounds after Lasix was increased to 40 mg daily as an outpatient.  Now down to 294lbs with no change in past 2 days -good diuresis with Lasix 40mg  IV TID with 2.9L UOP and net neg 6.2L but weight still up 9lbs from baseline -remains volume overloaded based on dry weight of 285lbs (still up 9lbs) and persistent LE edema -Cxray yesterday for worsening SOB showed multifocal PNA (RUL  predominant) -increase Lasix to 80mg  IV BID -Preliminary results for LLE doppler for edema showed on DVT or Baker's cyst but mixed echoes in groin ? Etiology ( await final result) - ? Whether some of his LE edema is related to 3rd spacing from hypoalbuminemia  2.  Paroxysmal atrial fibrillation/flutter -S/P recent a flutter ablation -changed to Apixaban 5mg  BID -Continue amiodarone 200 mg daily, Cardizem CD 240 mg daily and Lopressor 12.5 mg twice daily  3.  Pleuritic chest pain -Recent nuclear stress test 12/05/2017 showed questionable small area of reversible ischemia in the apex and mild hypokinesis of the septum likely related to his incomplete left bundle branch block -Coronary CTA with FFR was ordered as follow-up to his nuclear stress test but has not been completed yet -his pain is right-sided and likely related to his right middle lobe pneumonia as it is pleuritic and does not sound like coronary ischemia -Could also be due to a component of acute pericarditis given trivial PE on echo. -Continue colchicine 0.6 mg twice daily  4.  Minimally elevated troponin at 0.033 -Related to demand ischemia in the setting of pneumonia -Nuclear stress test showed very small defect in the apex -Again his symptoms are not consistent with ACS and more consistent with pleuritic chest pain from his pneumonia - 2D echo with low normal LVF Ef 50-55% . No further inpatient ischemic work-up. -plan coronary CTA with FFR outpt as was ordered after his nuc once renal function and PNA/CHF improved  5.  Hypokalemia -replete per TRH  I have spent a total of 35 minutes with patient reviewing echo, chest xray , telemetry, EKGs, labs and examining patient as well as establishing an assessment and plan that was discussed with the patient.  > 50% of time was spent in direct patient care.      For questions or updates, please contact [place] Please consult [email or web address] for contact info under  Cardiology/STEMI.      Signed, Fransico Him, MD  02/06/2018, 11:15 AM

## 2018-02-06 NOTE — Progress Notes (Signed)
Took pt off bipap around 1430.  Pt wanting to come off for a while.  Pt on 5L  sats 94-95

## 2018-02-06 NOTE — Progress Notes (Addendum)
PROGRESS NOTE    Wayne Booth  BMW:[id number] DOB: Oct 10, 1940 DOA: 02/02/2018  PCP: Marin Olp, MD   Brief Narrative:  77 year old male, morbidly obese, with past medical history significant for atrial fibrillation status post recent ablation, OSA but noncompliant with CPAP, hypothyroidism, history of pulmonary embolism, hypertension, anxiety, nephrolithiasis, documented renal and prostate cancer.  Patient is said to have undergone nephrectomy, and has one kidney.  Patient was admitted with shortness of breath, right-sided chest pain, fever, small pericardial effusion and worsening renal function.  CT chest shows multilobar pneumonia.  Patient on broad-spectrum antibiotics cefepime, negative MRSA PCR screen.  D-dimer elevated ruled out for PE with low probability VQ scan, heparin switch to Eliquis. Also element of volume overload, cardiology following diuresing, creatinine improving. Cardiac BMP done on 02/02/2018 was 54.3, and troponin has been 0.03 on 2 occasions.     Assessment & Plan:   Bilateral multilobar lobe pneumonia- on CT 02/03/18.  Low probability VQ scan.  4th day on broad-spectrum antibiotics cefepime, vancomycin discontinued, negative MRSA PCR. -Urine strep and Legionella negative -Patient still hypoxic, never smoker, is on home O2, currently requiring 4 L of O2, still dyspneic.  Placed transiently on BiPAP. -ABG shows pH 7.4, PCO2 43, relatively low PO2 65 on 5 L nasal cannula. -Cont Cefepime, will add azithromycin for atypical coverage -Considering no significant improvement on cefepime- day 4, will consult pulmonology, for evaluation and possible bronchoscopy. ( talked to Dr. Debbora Dus, they will see patient).  Acute on chronic diastolic CHF-BNP 54, but patient morbidly obese. - Per cardio, recs appreciated Lasix dose increased to 80 twice daily - Monitor daily weight and strict intake and output - Cardio following - ECHO showed preserved EF  Right-sided chest  pain- prior history of pulmonary embolism.  V/Q low probability. Patient has history of prostate and renal cancer. - Patient recently underwent ablation by the cardiology team for atrial fibrillation. -02/05/2018: Transitioned heparin drip back to Eliquis. -Treat pneumonia.  Acute kidney injury-initially improving with diuresis, ?cardiorenal etiology, creatinine now stable 1.5 ? new baseline -Possible cardiorenal component to AKI. - Follow up BMP in am  Paroxysmal atrial fibrillation-rate controlled on p.o. Amiodarone - Continue Cardizem 240 mg daily, metoprolol 12.5 mg twice a day -Heparin Drip >> Eliquis.  Hypertension, essential - Continue Cardizem and metoprolol   Hypothyroidism - Continue synthroid 150 mcg daily   Morbid obesity/OSA - Patient is not compliant with CPAP  Anxiety and depression - Continue xanax and lexapro - Continue Pamelor    DVT prophylaxis: Eliquis. Code Status: full code  Family Communication: Wife. Disposition Plan: home once pneumonia resolves, hypoxia resolved  Consultants:   Cardiology, Dr. Fransico Him   Procedures:   ECHO - EF 55%  Antimicrobials:   Cefepime 8/8 -->  Azithromycin 8/11>>    Subjective: Respiratory status today has not improved, patient reports increased difficulty breathing, persistent right chest pain.  Objective: Vitals:   02/06/18 0943 02/06/18 1152 02/06/18 1154 02/06/18 1502  BP:  109/69    Pulse:      Resp:  16    Temp:  98.5 F (36.9 C)    TempSrc:  Oral    SpO2: 97% 97% 96% 94%  Weight:      Height:        Intake/Output Summary (Last 24 hours) at 02/06/2018 1715 Last data filed at 02/06/2018 1500 Gross per 24 hour  Intake 1030 ml  Output 4550 ml  Net -3520 ml   [employer]  02/03/18 0500 02/05/18 0635 02/06/18 0551  Weight: 134.8 kg 133.6 kg 133.4 kg    Examination:  General exam: Appears calm and comfortable  Respiratory system: basilar Crackles bilateral lung  posteriorly Cardiovascular system: S1 & S2 heard .  Pitting pedal edema.  Gastrointestinal system: Abdomen is nondistended, soft and nontender. Organs are difficult to assess. Central nervous system: Alert and oriented. No focal neurological deficits. Extremities: Bilateral leg edema seems to be improving.   Data Reviewed: I have personally reviewed following labs and imaging studies  CBC: Recent Labs  Lab 02/02/18 2018 02/03/18 0902 02/04/18 0730 02/05/18 0423 02/06/18 0340  WBC 16.6* 14.1* 13.5* 13.0* 12.8*  NEUTROABS 12.4*  --  9.5*  --  8.2*  HGB 13.1 13.2 12.5* 12.4* 11.8*  HCT 39.7 41.3 38.6* 38.0* 36.1*  MCV 91.1 92.4 92.1 90.9 90.7  PLT 307 338 319 327 970   Basic Metabolic Panel: Recent Labs  Lab 02/02/18 1727 02/03/18 0902 02/04/18 0730 02/05/18 0423 02/06/18 0340  NA 131* 132* 134* 132* 132*  K 3.9 4.1 3.9 3.8 3.2*  CL 96* 96* 96* 97* 94*  CO2 25 22 27 22 28   GLUCOSE 97 135* 101* 98 116*  BUN 13 16 15 15 18   CREATININE 1.35* 1.64* 1.55* 1.49* 1.56*  CALCIUM 8.4* 8.7* 8.7* 8.3* 8.3*  MG  --   --  2.1  --  2.0  2.0  PHOS  --   --  3.4  --  3.7   GFR: Estimated Creatinine Clearance: 57 mL/min (A) (by C-G formula based on SCr of 1.56 mg/dL (H)). Liver Function Tests: Recent Labs  Lab 02/04/18 0730 02/06/18 0340  ALBUMIN 2.8* 2.5*   Coagulation Profile: Recent Labs  Lab 02/03/18 0434  INR 1.20   Cardiac Enzymes: Recent Labs  Lab 02/03/18 0351 02/03/18 0902 02/03/18 1720  TROPONINI 0.03* 0.03* <0.03   CBG: Recent Labs  Lab 02/02/18 1811  GLUCAP 85   Urine analysis:    Component Value Date/Time   COLORURINE AMBER (A) 02/03/2018 1620   APPEARANCEUR CLEAR 02/03/2018 1620   LABSPEC 1.016 02/03/2018 1620   PHURINE 5.0 02/03/2018 1620   GLUCOSEU NEGATIVE 02/03/2018 1620   HGBUR NEGATIVE 02/03/2018 1620   HGBUR 2+ 09/22/2007 1444   BILIRUBINUR NEGATIVE 02/03/2018 1620   BILIRUBINUR n 11/07/2015 1417   KETONESUR NEGATIVE 02/03/2018 1620    PROTEINUR NEGATIVE 02/03/2018 1620   UROBILINOGEN 1.0 11/07/2015 1417   UROBILINOGEN 1.0 05/22/2011 1454   NITRITE NEGATIVE 02/03/2018 1620   LEUKOCYTESUR NEGATIVE 02/03/2018 1620   Sepsis Labs: @LABRCNTIP (procalcitonin:4,lacticidven:4)   ) Recent Results (from the past 240 hour(s))  Culture, blood (routine x 2)     Status: None (Preliminary result)   Collection Time: 02/02/18  5:27 PM  Result Value Ref Range Status   Specimen Description BLOOD BLOOD LEFT FOREARM  Final   Special Requests   Final    BOTTLES DRAWN AEROBIC AND ANAEROBIC Blood Culture results may not be optimal due to an inadequate volume of blood received in culture bottles   Culture   Final    NO GROWTH 4 DAYS Performed at [facility], [place] [address] [place], [place] [address]    Report Status PENDING  Incomplete  MRSA PCR Screening     Status: None   Collection Time: 02/03/18  2:44 AM  Result Value Ref Range Status   MRSA by PCR NEGATIVE NEGATIVE Final    Comment:        The GeneXpert  MRSA Assay (FDA approved for NASAL specimens only), is one component of a comprehensive MRSA colonization surveillance program. It is not intended to diagnose MRSA infection nor to guide or monitor treatment for MRSA infections. Performed at [facility], [place] [address], [place], [place] [address]   Culture, sputum-assessment     Status: None   Collection Time: 02/03/18  5:08 PM  Result Value Ref Range Status   Specimen Description EXPECTORATED SPUTUM  Final   Special Requests NONE  Final   Sputum evaluation   Final    THIS SPECIMEN IS ACCEPTABLE FOR SPUTUM CULTURE Performed at [facility], [address], [place], [place] [address]    Report Status 02/03/2018 FINAL  Final  Culture, respiratory     Status: None   Collection Time: 02/03/18  5:08 PM  Result Value Ref Range Status   Specimen Description EXPECTORATED SPUTUM  Final   Special Requests NONE Reflexed from [id number]  Final   Gram Stain    Final    ABUNDANT WBC PRESENT, PREDOMINANTLY PMN MODERATE GRAM POSITIVE COCCI    Culture   Final    Consistent with normal respiratory flora. Performed at [facility], [place] [address] [place], [place] [address]    Report Status 02/06/2018 FINAL  Final      Radiology Studies: Dg Chest 2 View  Result Date: 02/05/2018 CLINICAL DATA:  Short of breath. Mid chest pain for a couple of days. EXAM: CHEST - 2 VIEW COMPARISON:  02/03/2018 and older exams. FINDINGS: Hazy airspace consolidation is noted on the right predominantly in the mid lung. There is basilar consolidation, greater on the left. These findings are stable from the previous exam and previous chest CT. No new lung abnormalities. Heart is normal in size. No convincing mediastinal or hilar masses. Small effusion seen on the prior CT are not well-defined radiographically. They are likely unchanged. No pneumothorax. IMPRESSION: 1. No significant change in lung aeration from the most recent prior exams. Bilateral airspace lung opacities consistent with multifocal pneumonia. Electronically Signed   By: Lajean Manes M.D.   On: 02/05/2018 18:33   Ct Chest Wo Contrast  Result Date: 02/03/2018 CLINICAL DATA:  Dyspnea and right-sided chest pain with fever. EXAM: CT CHEST WITHOUT CONTRAST TECHNIQUE: Multidetector CT imaging of the chest was performed following the standard protocol without IV contrast. COMPARISON:  Chest CT 10/20/2016, CXR 02/03/2018 FINDINGS: Cardiovascular: Cardiomegaly with trace pericardial effusion measuring up to 8 mm anteriorly. Scattered coronary arteriosclerosis is identified along the LAD. Aortic atherosclerosis is noted at the root and aortic arch. No aneurysm is visualized. The unenhanced pulmonary vasculature is unremarkable. Mediastinum/Nodes: Mediastinal lipomatosis. Mild enlargement of right lower paratracheal lymph node to 15 mm short axis with aortic O pulmonary window lymph node measuring up to 11 mm short axis.  Smaller superior mediastinal lymph nodes are present. These are likely reactive. Trachea and mainstem bronchi appear patent. Esophagus is unremarkable. No thyromegaly or mass. Lungs/Pleura: Patchy bibasilar, right middle and right upper lobe airspace opacities with air bronchograms consistent with multilobar pneumonia. Small bilateral pleural effusions left greater than right are noted compatible with small parapneumonic effusions. Adjacent atelectasis is seen. No pneumothorax is noted. Upper Abdomen: The patient is status post left nephrectomy. Stable appearance of the retroperitoneum on the left. Soft tissue structure in the left upper quadrant may represent partial volume averaging of the spleen with displacement due to nephrectomy. Surgically absent gallbladder. Mild steatosis of liver. Musculoskeletal: Osteophytosis of the  thoracic spine with exaggerated kyphosis. Chronic posterior right eleventh and anterolateral fourth rib fractures. No acute osseous abnormality. IMPRESSION: 1. Bilateral multilobar pneumonia involving the right upper, middle and both lower lobes with air bronchograms and reactive adenopathy. Small parapneumonic effusions are noted bilaterally. 2. Coronary arteriosclerosis and aortic atherosclerosis. 3. Mediastinal lipomatosis. 4. Hepatic steatosis. 5. Status post cholecystectomy and left nephrectomy. Aortic Atherosclerosis (ICD10-I70.0). Electronically Signed   By: Ashley Royalty M.D.   On: 02/03/2018 18:28   Nm Pulmonary Perf And Vent  Result Date: 02/03/2018 CLINICAL DATA:  Pulmonary embolism EXAM: NUCLEAR MEDICINE VENTILATION - PERFUSION LUNG SCAN TECHNIQUE: Ventilation images were obtained in multiple projections using inhaled aerosol Tc-87m DTPA. Perfusion images were obtained in multiple projections after intravenous injection of Tc-32m-MAA. RADIOPHARMACEUTICALS:  32.5 mCi of Tc-87m DTPA aerosol inhalation and 3.7 mCi Tc72m-MAA IV COMPARISON:  11/02/2017 Correlation: Chest radiograph  02/03/2018 FINDINGS: Ventilation: Central airway deposition of aerosol. Ventilation defects identified in lateral RIGHT mid lung and throughout entire LEFT lower lobe. Perfusion: Much better perfusion and ventilation in the lateral RIGHT mid lung. Perfusion to the LEFT lower lobe much better than ventilation. Remaining perfusion pattern is normal. Perfusion abnormality in LEFT lower lobe is new since the previous exam. Chest radiograph: Enlargement of cardiac silhouette with atelectasis versus consolidation in LEFT lower lobe as well as mild atelectasis in RIGHT mid lung and at RIGHT base IMPRESSION: Much better perfusion than ventilation in lateral RIGHT mid lung and throughout LEFT lower lobe, with remaining perfusion normal. Findings represent a low probability for pulmonary embolism. Electronically Signed   By: Lavonia Dana M.D.   On: 02/03/2018 16:43   Dg Chest Port 1 View  Result Date: 02/06/2018 CLINICAL DATA:  Shortness of breath, right chest pain EXAM: PORTABLE CHEST 1 VIEW COMPARISON:  Chest radiographs dated 02/05/2018. CT chest dated 02/03/2018. FINDINGS: Focal/patchy right midlung opacity. Mild patchy left lower lobe opacity. Mild right basilar opacity. When correlating with prior CT, this likely reflects multifocal pneumonia. Small left pleural effusion. No pneumothorax. Heart is top-normal in size. IMPRESSION: Suspected multifocal pneumonia, right upper lobe predominant, better evaluated on prior CT. Small left pleural effusion. Electronically Signed   By: Julian Hy M.D.   On: 02/06/2018 10:59   Dg Chest Port 1 View  Result Date: 02/03/2018 CLINICAL DATA:  Shortness of breath EXAM: PORTABLE CHEST 1 VIEW COMPARISON:  02/02/2018 FINDINGS: Shallow lung inflation with unchanged cardiomegaly. Mild right mid lung opacities are unchanged. Small left pleural effusion with associated atelectasis. IMPRESSION: Unchanged chest radiograph with mild right midlung opacity and small left pleural  effusion. Electronically Signed   By: Ulyses Jarred M.D.   On: 02/03/2018 04:16   Dg Chest Port 1 View  Result Date: 02/02/2018 CLINICAL DATA:  Shortness of breath and RIGHT chest pain. Recent atrial fibrillation. EXAM: PORTABLE CHEST 1 VIEW COMPARISON:  Chest radiograph January 31, 2018 FINDINGS: Similar cardiomegaly. Calcified aortic arch. Pulmonary vasculature congestion and interstitial prominence with RIGHT mid lung zone consolidation. Small bilateral pleural effusions. No pneumothorax. No pneumothorax. Soft tissue planes and included osseous structures are non suspicious. IMPRESSION: 1. New RIGHT mid lung zone consolidation concerning for pneumonia versus confluent edema. 2. Stable cardiomegaly and interstitial edema with small pleural effusions. Electronically Signed   By: Elon Alas M.D.   On: 02/02/2018 17:58    Scheduled Meds: . allopurinol  150 mg Oral Daily  . amiodarone  200 mg Oral Daily  . apixaban  5 mg Oral BID  . budesonide (PULMICORT) nebulizer  solution  0.25 mg Nebulization BID  . colchicine  0.6 mg Oral Daily  . diltiazem  240 mg Oral Daily  . escitalopram  20 mg Oral Daily  . furosemide  80 mg Intravenous BID  . ipratropium-albuterol  3 mL Nebulization Q6H  . levothyroxine  150 mcg Oral QAC breakfast  . mouth rinse  15 mL Mouth Rinse BID  . metoprolol tartrate  12.5 mg Oral BID  . multivitamin with minerals  1 tablet Oral Daily  . nortriptyline  75 mg Oral QHS  . potassium chloride  40 mEq Oral BID   Continuous Infusions: . azithromycin 500 mg (02/06/18 1110)  . ceFEPime (MAXIPIME) IV 1 g (02/06/18 1428)     LOS: 4 days    Time spent: 35 minutes    Shali Vesey Arlyce Dice, MD [facility] Pager [phone]  If 7PM-7AM, please contact night-coverage [email or web address] Password [facility] 02/06/2018, 5:15 PM

## 2018-02-07 DIAGNOSIS — E8770 Fluid overload, unspecified: Secondary | ICD-10-CM

## 2018-02-07 DIAGNOSIS — J810 Acute pulmonary edema: Secondary | ICD-10-CM

## 2018-02-07 DIAGNOSIS — G4733 Obstructive sleep apnea (adult) (pediatric): Secondary | ICD-10-CM

## 2018-02-07 DIAGNOSIS — J9601 Acute respiratory failure with hypoxia: Secondary | ICD-10-CM

## 2018-02-07 DIAGNOSIS — J189 Pneumonia, unspecified organism: Secondary | ICD-10-CM

## 2018-02-07 LAB — BASIC METABOLIC PANEL
ANION GAP: 8 (ref 5–15)
BUN: 17 mg/dL (ref 8–23)
CHLORIDE: 96 mmol/L — AB (ref 98–111)
CO2: 30 mmol/L (ref 22–32)
Calcium: 8.5 mg/dL — ABNORMAL LOW (ref 8.9–10.3)
Creatinine, Ser: 1.48 mg/dL — ABNORMAL HIGH (ref 0.61–1.24)
GFR calc Af Amer: 52 mL/min — ABNORMAL LOW (ref >60)
GFR calc non Af Amer: 45 mL/min — ABNORMAL LOW (ref >60)
GLUCOSE: 111 mg/dL — AB (ref 70–99)
Potassium: 3.7 mmol/L (ref 3.5–5.1)
Sodium: 134 mmol/L — ABNORMAL LOW (ref 135–145)

## 2018-02-07 LAB — CBC
HCT: 37.7 % — ABNORMAL LOW (ref 39.0–52.0)
Hemoglobin: 12.4 g/dL — ABNORMAL LOW (ref 13.0–17.0)
MCH: 29.9 pg (ref 26.0–34.0)
MCHC: 32.9 g/dL (ref 30.0–36.0)
MCV: 90.8 fL (ref 78.0–100.0)
Platelets: 384 10*3/uL (ref 150–400)
RBC: 4.15 MIL/uL — ABNORMAL LOW (ref 4.22–5.81)
RDW: 14.1 % (ref 11.5–15.5)
WBC: 14.1 10*3/uL — ABNORMAL HIGH (ref 4.0–10.5)

## 2018-02-07 LAB — CULTURE, BLOOD (ROUTINE X 2): CULTURE: NO GROWTH

## 2018-02-07 NOTE — Clinical Social Work Note (Signed)
Clinical Social Work Assessment  Patient Details  Name: Wayne Booth MRN: [id number] Date of Birth: 1941-05-14  Date of referral:  02/07/18               Reason for consult:  Facility Placement                Permission sought to share information with:  Chartered certified accountant granted to share information::  Yes, Verbal Permission Granted  Name::     Retail buyer::  SNF  Relationship::  niece  Contact Information:     Housing/Transportation Living arrangements for the past 2 months:  Single Family Home Source of Information:  Patient Patient Interpreter Needed:  None Criminal Activity/Legal Involvement Pertinent to Current Situation/Hospitalization:  No - Comment as needed Significant Relationships:  Spouse Lives with:  Spouse Do you feel safe going back to the place where you live?  Yes Need for family participation in patient care:  No (Coment)  Care giving concerns:  Pt lives with wife at home and normally independent- now with increased impairment and unsure if he could manage at home.   Social Worker assessment / plan:  CSW spoke with pt and pt niece at bedside concerning PT recommendation for SNF.  CSW explained SNF and SNF referral process to pt.  Employment status:  Retired Nurse, adult PT Recommendations:  [place] / Referral to community resources:  [place]  Patient/Family's Response to care:  Pt agreeable to looking into SNF options but would prefer to return home if able.    Patient/Family's Understanding of and Emotional Response to Diagnosis, Current Treatment, and Prognosis:  Pt expressed good understanding of condition and medical recommendations- hopeful he can return home soon.  Emotional Assessment Appearance:  Appears stated age Attitude/Demeanor/Rapport:    Affect (typically observed):  Appropriate, Pleasant Orientation:  Oriented to Self, Oriented to  Place, Oriented to  Time, Oriented to Situation Alcohol / Substance use:  Not Applicable Psych involvement (Current and /or in the community):  No (Comment)  Discharge Needs  Concerns to be addressed:  Care Coordination Readmission within the last 30 days:  Yes Current discharge risk:  Physical Impairment Barriers to Discharge:  Continued Medical Work up   Jorge Ny, LCSW 02/07/2018, 4:32 PM

## 2018-02-07 NOTE — Consult Note (Addendum)
   [facility] CM Inpatient Consult   02/07/2018  FAYEZ STURGELL [birth year] [id number]    Patient screened for potential [facility] Care Management program due to unplanned readmission risk score of 28% (high).  Chart reviewed. Noted therapy recommendation is for SNF. Went to bedside to speak with patient and wife about [place] Management services. However, respiratory therapist was at bedside.  Spoke with inpatient RNCM. Will continue to follow along for disposition plans and engage for [facility] Management program services if appropriate.    Marthenia Rolling, MSN-Ed, RN,BSN [facility] Liaison [phone]

## 2018-02-07 NOTE — NC FL2 (Signed)
[place] LEVEL OF CARE SCREENING TOOL     IDENTIFICATION  Patient Name: Wayne Booth Birthdate: 1940/09/12 Sex: male Admission Date (Current Location): 02/02/2018  [facility] and [place] Number:  Herbalist and Address:  [facility] [place], [place] [address] [place], [place] [address]      Provider Number: [id number]  Attending Physician Name and Address:  Roney Jaffe, MD  Relative Name and Phone Number:       Current Level of Care: Hospital Recommended Level of Care: [place] Prior Approval Number:    Date Approved/Denied:   PASRR Number: [id number]  Discharge Plan: SNF    Current Diagnoses: Patient Active Problem List   Diagnosis Date Noted  . Acute on chronic diastolic heart failure ([place])   . HCAP (healthcare-associated pneumonia) 02/03/2018  . Pericardial effusion   . SOB (shortness of breath)   . Elevated troponin   . Acute respiratory failure with hypoxia ([place]) 02/02/2018  . Paroxysmal atrial fibrillation ([place]) 01/28/2018  . Constipation 01/21/2018  . (HFpEF) heart failure with preserved ejection fraction ([place]) 12/16/2017  . Chest pain   . Anticoagulated   . Atrial flutter ([place]) 11/01/2017  . Bilateral foot pain 07/13/2017  . Community acquired pneumonia 06/18/2017  . Shortness of breath 06/14/2017  . History of adenomatous polyp of colon 04/12/2017  . History of pulmonary embolism 10/23/2016  . Cervical disc disease 11/13/2015  . Essential tremor 08/14/2015  . Gout 08/14/2015  . Hypothyroidism 07/12/2014  . Hyperglycemia 07/12/2014  . GAD (generalized anxiety disorder) 11/07/2013  . OSA (obstructive sleep apnea) 07/07/2011  . Asthmatic bronchitis 06/09/2011  . Actinic keratosis 11/27/2009  . Osteoarthritis 11/13/2008  . 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 Morbid obesity ([place]) 10/02/2008  . History of renal cell carcinoma 12/26/2007  . TESTOSTERONE DEFICIENCY 12/28/2006  . Essential  hypertension 12/28/2006    Orientation RESPIRATION BLADDER Height & Weight     Self, Time, Situation, Place  O2(4L [place]) Incontinent, External catheter Weight: 294 lb 1.5 oz (133.4 kg) Height:  5\' 11"  (180.3 cm)  BEHAVIORAL SYMPTOMS/MOOD NEUROLOGICAL BOWEL NUTRITION STATUS      Continent Diet(fluid restriction; cardiac)  AMBULATORY STATUS COMMUNICATION OF NEEDS Skin   Limited Assist Verbally Normal                       Personal Care Assistance Level of Assistance  Bathing, Dressing Bathing Assistance: Limited assistance   Dressing Assistance: Limited assistance     Functional Limitations Info             SPECIAL CARE FACTORS FREQUENCY  PT (By licensed PT), OT (By licensed OT)     PT Frequency: 5/wk OT Frequency: 5/wk            Contractures      Additional Factors Info  Code Status, Allergies Code Status Info: FULL Allergies Info: NKA           Current Medications (02/07/2018):  This is the current hospital active medication list Current Facility-Administered Medications  Medication Dose Route Frequency Provider Last Rate Last Dose  . acetaminophen (TYLENOL) tablet 650 mg  650 mg Oral Q6H PRN Rise Patience, MD   650 mg at 02/06/18 2106   Or  . acetaminophen (TYLENOL) suppository 650 mg  650 mg Rectal Q6H PRN Rise Patience, MD      . allopurinol (ZYLOPRIM) tablet 150 mg  150 mg Oral Daily Rise Patience, MD  150 mg at 02/07/18 0757  . ALPRAZolam Duanne Moron) tablet 0.25 mg  0.25 mg Oral TID PRN Dana Allan I, MD   0.25 mg at 02/07/18 1448  . amiodarone (PACERONE) tablet 200 mg  200 mg Oral Daily Rise Patience, MD   200 mg at 02/07/18 0754  . apixaban (ELIQUIS) tablet 5 mg  5 mg Oral BID Dana Allan I, MD   5 mg at 02/07/18 0756  . azithromycin (ZITHROMAX) 500 mg in sodium chloride 0.9 % 250 mL IVPB  500 mg Intravenous Q24H Emokpae, Ejiroghene E, MD 250 mL/hr at 02/07/18 1139 500 mg at 02/07/18 1139  . budesonide  (PULMICORT) nebulizer solution 0.25 mg  0.25 mg Nebulization BID Dana Allan I, MD   0.25 mg at 02/07/18 0840  . ceFEPIme (MAXIPIME) 1 g in sodium chloride 0.9 % 100 mL IVPB  1 g Intravenous Q8H Rise Patience, MD 200 mL/hr at 02/07/18 1449 1 g at 02/07/18 1449  . colchicine tablet 0.6 mg  0.6 mg Oral Daily Rise Patience, MD   0.6 mg at 02/07/18 0752  . diltiazem (CARDIZEM CD) 24 hr capsule 240 mg  240 mg Oral Daily Rise Patience, MD   240 mg at 02/07/18 0752  . escitalopram (LEXAPRO) tablet 20 mg  20 mg Oral Daily Rise Patience, MD   20 mg at 02/07/18 0755  . furosemide (LASIX) injection 80 mg  80 mg Intravenous BID Sueanne Margarita, MD   80 mg at 02/07/18 0747  . ipratropium-albuterol (DUONEB) 0.5-2.5 (3) MG/3ML nebulizer solution 3 mL  3 mL Nebulization Q6H Dana Allan I, MD   3 mL at 02/07/18 1433  . levothyroxine (SYNTHROID, LEVOTHROID) tablet 150 mcg  150 mcg Oral QAC breakfast Rise Patience, MD   150 mcg at 02/07/18 0748  . MEDLINE mouth rinse  15 mL Mouth Rinse BID Dana Allan I, MD   15 mL at 02/07/18 0900  . metoprolol tartrate (LOPRESSOR) tablet 12.5 mg  12.5 mg Oral BID Rise Patience, MD   12.5 mg at 02/07/18 0754  . multivitamin with minerals tablet 1 tablet  1 tablet Oral Daily Rise Patience, MD   1 tablet at 02/07/18 0753  . nitroGLYCERIN (NITROSTAT) SL tablet 0.4 mg  0.4 mg Sublingual Q5 min PRN Rise Patience, MD      . nortriptyline (PAMELOR) capsule 75 mg  75 mg Oral QHS Rise Patience, MD   75 mg at 02/06/18 2106  . ondansetron (ZOFRAN) tablet 4 mg  4 mg Oral Q6H PRN Rise Patience, MD       Or  . ondansetron [facility]) injection 4 mg  4 mg Intravenous Q6H PRN Rise Patience, MD      . zolpidem [facility]) tablet 5 mg  5 mg Oral QHS PRN Rise Patience, MD   5 mg at 02/06/18 2106     Discharge Medications: Please see discharge summary for a list of discharge medications.  Relevant  Imaging Results:  Relevant Lab Results:   Additional Information SS#: [id number]  Jorge Ny, LCSW

## 2018-02-07 NOTE — Progress Notes (Addendum)
PROGRESS NOTE    WRIGHT GRAVELY  WUJ:[id number] DOB: [birth year] DOA: 02/02/2018  PCP: Marin Olp, MD   Brief Narrative:  77 year old male, morbidly obese, with past medical history significant for atrial fibrillation status post recent ablation, OSA but noncompliant with CPAP, hypothyroidism, history of pulmonary embolism, hypertension, anxiety, nephrolithiasis, documented renal and prostate cancer.  Patient is said to have undergone nephrectomy, and has one kidney.  Patient was admitted with shortness of breath, right-sided chest pain, fever, small pericardial effusion and worsening renal function.  CT chest shows multilobar pneumonia.  Patient on broad-spectrum antibiotics cefepime, negative MRSA PCR screen.  D-dimer elevated ruled out for PE with low probability VQ scan, heparin switch to Eliquis. Also element of volume overload, cardiology following diuresing, creatinine improving. Cardiac BMP done on 02/02/2018 was 54.3, and troponin has been 0.03 on 2 occasions.     Assessment & Plan:   Bilateral multilobar lobe pneumonia- on CT 02/03/18.  Low probability VQ scan.  4th day on broad-spectrum antibiotics cefepime, vancomycin discontinued, negative MRSA PCR. -Urine strep and Legionella negative -Patient still hypoxic, never smoker, is on home O2, currently requiring 4 L of O2, still dyspneic.   -Cont Cefepime, added azithromycin for atypical coverage  Resp distress/ dyspnea / acute on chronic diastolic CHF- dyspnea combination of underlying COPD (on home O2) w/ new bilat PNA and also suspected diast CHF issues.  BNP 55, but patient morbidly obese - today w/ lots of rales on exam, distended abdomen.  Diuresing very well (10.5 L UOP in 4 days) but hasn't lost any weight since admission, is up 1kg. Per RN drank "8 sodas" yesterday. Still looks very SOB at rest in the chair and requiring bipap off and on.  Discussed w/ patient and RN that IV lasix isn't helping if he is drinking as much as were  are talking off.  Strict fluid restriction encouraged.  - Per cardio, cont IV lasix 80 twice daily - Monitor daily weight and strict intake and output - Cardio following - ECHO showed preserved EF  LLE erythema/ edema - doppler done, negative for DVT - may be stasis dermatitis vs early cellulitis. Is on abx already should cover but not covering MRSA - will outline w/ marker and reassess tomorrow am - cont diuresing and restrict po intake  Right-sided chest pain- prior history of pulmonary embolism.  V/Q low probability. Patient has history of prostate and renal cancer. - Patient recently underwent ablation by the cardiology team for atrial fibrillation. -02/05/2018: Transitioned heparin drip back to Eliquis. -Treat pneumonia.  Acute kidney injury-initially improving with diuresis, ?cardiorenal etiology, creatinine now stable 1.5, prob new baseline -Possible cardiorenal component to AKI. - Follow up BMP in am  Paroxysmal atrial fibrillation-rate controlled on p.o. Amiodarone - Continue Cardizem 240 mg daily, metoprolol 12.5 mg twice a day -Heparin Drip >> Eliquis.  Hypertension, essential - Continue Cardizem and metoprolol   Hypothyroidism - Continue synthroid 150 mcg daily   Morbid obesity/OSA - Patient is not compliant with CPAP  Anxiety and depression - Continue xanax and lexapro - Continue Pamelor     Kelly Splinter MD [facility] pgr [phone] 11/21/2017, 9:25 AM    DVT prophylaxis: Eliquis. Code Status: full code  Family Communication: wife at bedside Disposition Plan: home once breathing better  Consultants:   Cardiology, Dr. Fransico Him   Procedures:   ECHO - EF 55%  Antimicrobials:   Cefepime 8/8 -->  Azithromycin 8/11>>    Subjective: Respiratory status  still poor. Drinking too much fluid per RN.  2.4 L out yest but wts are up 1kg from [place].   Objective: Vitals:   02/07/18 0840 02/07/18 0845 02/07/18 0856 02/07/18  1222  BP:   (!) 126/112 109/75  Pulse:   86 89  Resp:   20 18  Temp:   99 F (37.2 C) 98.6 F (37 C)  TempSrc:   Oral Oral  SpO2: 94% 94% (!) 79% (!) 84%  Weight:      Height:        Intake/Output Summary (Last 24 hours) at 02/07/2018 1239 Last data filed at 02/07/2018 0825 Gross per 24 hour  Intake 2796.44 ml  Output 2425 ml  Net 371.44 ml   Filed Weights   02/05/18 0635 02/06/18 0551 02/07/18 0252  Weight: 133.6 kg 133.4 kg 133.4 kg    Examination:  General exam: Appears uncomfortable and drowsy Respiratory system: basilar rales 1/2 up bilat post Cardiovascular system: S1 & S2 heard .  Bilat Pitting pedal edema.  Gastrointestinal system: Abdomen is nondistended, soft and nontender. Organs are difficult to assess. Central nervous system: Alert and oriented. No focal neurological deficits. Extremities: R leg edema is better, but L leg still swollen and mild erythema.   Data Reviewed: I have personally reviewed following labs and imaging studies  CBC: Recent Labs  Lab 02/02/18 2018 02/03/18 0902 02/04/18 0730 02/05/18 0423 02/06/18 0340 02/07/18 0320  WBC 16.6* 14.1* 13.5* 13.0* 12.8* 14.1*  NEUTROABS 12.4*  --  9.5*  --  8.2*  --   HGB 13.1 13.2 12.5* 12.4* 11.8* 12.4*  HCT 39.7 41.3 38.6* 38.0* 36.1* 37.7*  MCV 91.1 92.4 92.1 90.9 90.7 90.8  PLT 307 338 319 327 338 759   Basic Metabolic Panel: Recent Labs  Lab 02/03/18 0902 02/04/18 0730 02/05/18 0423 02/06/18 0340 02/07/18 0320  NA 132* 134* 132* 132* 134*  K 4.1 3.9 3.8 3.2* 3.7  CL 96* 96* 97* 94* 96*  CO2 22 27 22 28 30   GLUCOSE 135* 101* 98 116* 111*  BUN 16 15 15 18 17   CREATININE 1.64* 1.55* 1.49* 1.56* 1.48*  CALCIUM 8.7* 8.7* 8.3* 8.3* 8.5*  MG  --  2.1  --  2.0  2.0  --   PHOS  --  3.4  --  3.7  --    GFR: Estimated Creatinine Clearance: 60.1 mL/min (A) (by C-G formula based on SCr of 1.48 mg/dL (H)). Liver Function Tests: Recent Labs  Lab 02/04/18 0730 02/06/18 0340  ALBUMIN  2.8* 2.5*   Coagulation Profile: Recent Labs  Lab 02/03/18 0434  INR 1.20   Cardiac Enzymes: Recent Labs  Lab 02/03/18 0351 02/03/18 0902 02/03/18 1720  TROPONINI 0.03* 0.03* <0.03   CBG: Recent Labs  Lab 02/02/18 1811  GLUCAP 85   Urine analysis:    Component Value Date/Time   COLORURINE AMBER (A) 02/03/2018 1620   APPEARANCEUR CLEAR 02/03/2018 1620   LABSPEC 1.016 02/03/2018 1620   PHURINE 5.0 02/03/2018 1620   GLUCOSEU NEGATIVE 02/03/2018 1620   HGBUR NEGATIVE 02/03/2018 1620   HGBUR 2+ 09/22/2007 1444   BILIRUBINUR NEGATIVE 02/03/2018 1620   BILIRUBINUR n 11/07/2015 1417   KETONESUR NEGATIVE 02/03/2018 1620   PROTEINUR NEGATIVE 02/03/2018 1620   UROBILINOGEN 1.0 11/07/2015 1417   UROBILINOGEN 1.0 05/22/2011 1454   NITRITE NEGATIVE 02/03/2018 1620   LEUKOCYTESUR NEGATIVE 02/03/2018 1620   Sepsis Labs: @LABRCNTIP (procalcitonin:4,lacticidven:4)   ) Recent Results (from the past 240 hour(s))  Culture, blood (  routine x 2)     Status: None   Collection Time: 02/02/18  5:27 PM  Result Value Ref Range Status   Specimen Description BLOOD BLOOD LEFT FOREARM  Final   Special Requests   Final    BOTTLES DRAWN AEROBIC AND ANAEROBIC Blood Culture results may not be optimal due to an inadequate volume of blood received in culture bottles   Culture   Final    NO GROWTH 5 DAYS Performed at [facility], [place] [address] [place], [place] [address]    Report Status 02/07/2018 FINAL  Final  MRSA PCR Screening     Status: None   Collection Time: 02/03/18  2:44 AM  Result Value Ref Range Status   MRSA by PCR NEGATIVE NEGATIVE Final    Comment:        The GeneXpert MRSA Assay (FDA approved for NASAL specimens only), is one component of a comprehensive MRSA colonization surveillance program. It is not intended to diagnose MRSA infection nor to guide or monitor treatment for MRSA infections. Performed at [facility], [place] [address] [place], [place]  [address]   Culture, sputum-assessment     Status: None   Collection Time: 02/03/18  5:08 PM  Result Value Ref Range Status   Specimen Description EXPECTORATED SPUTUM  Final   Special Requests NONE  Final   Sputum evaluation   Final    THIS SPECIMEN IS ACCEPTABLE FOR SPUTUM CULTURE Performed at [facility], [address] [place], [place] [address]    Report Status 02/03/2018 FINAL  Final  Culture, respiratory     Status: None   Collection Time: 02/03/18  5:08 PM  Result Value Ref Range Status   Specimen Description EXPECTORATED SPUTUM  Final   Special Requests NONE Reflexed from [id number]  Final   Gram Stain   Final    ABUNDANT WBC PRESENT, PREDOMINANTLY PMN MODERATE GRAM POSITIVE COCCI    Culture   Final    Consistent with normal respiratory flora. Performed at [facility], [place] [address], [place], [place] [address]    Report Status 02/06/2018 FINAL  Final      Radiology Studies: Dg Chest 2 View  Result Date: 02/05/2018 CLINICAL DATA:  Short of breath. Mid chest pain for a couple of days. EXAM: CHEST - 2 VIEW COMPARISON:  02/03/2018 and older exams. FINDINGS: Hazy airspace consolidation is noted on the right predominantly in the mid lung. There is basilar consolidation, greater on the left. These findings are stable from the previous exam and previous chest CT. No new lung abnormalities. Heart is normal in size. No convincing mediastinal or hilar masses. Small effusion seen on the prior CT are not well-defined radiographically. They are likely unchanged. No pneumothorax. IMPRESSION: 1. No significant change in lung aeration from the most recent prior exams. Bilateral airspace lung opacities consistent with multifocal pneumonia. Electronically Signed   By: Lajean Manes M.D.   On: 02/05/2018 18:33   Ct Chest Wo Contrast  Result Date: 02/03/2018 CLINICAL DATA:  Dyspnea and right-sided chest pain with fever. EXAM: CT CHEST WITHOUT CONTRAST TECHNIQUE: Multidetector CT imaging  of the chest was performed following the standard protocol without IV contrast. COMPARISON:  Chest CT 10/20/2016, CXR 02/03/2018 FINDINGS: Cardiovascular: Cardiomegaly with trace pericardial effusion measuring up to 8 mm anteriorly. Scattered coronary arteriosclerosis is identified along the LAD. Aortic atherosclerosis is noted at the root and aortic arch. No aneurysm is visualized. The unenhanced pulmonary vasculature  is unremarkable. Mediastinum/Nodes: Mediastinal lipomatosis. Mild enlargement of right lower paratracheal lymph node to 15 mm short axis with aortic O pulmonary window lymph node measuring up to 11 mm short axis. Smaller superior mediastinal lymph nodes are present. These are likely reactive. Trachea and mainstem bronchi appear patent. Esophagus is unremarkable. No thyromegaly or mass. Lungs/Pleura: Patchy bibasilar, right middle and right upper lobe airspace opacities with air bronchograms consistent with multilobar pneumonia. Small bilateral pleural effusions left greater than right are noted compatible with small parapneumonic effusions. Adjacent atelectasis is seen. No pneumothorax is noted. Upper Abdomen: The patient is status post left nephrectomy. Stable appearance of the retroperitoneum on the left. Soft tissue structure in the left upper quadrant may represent partial volume averaging of the spleen with displacement due to nephrectomy. Surgically absent gallbladder. Mild steatosis of liver. Musculoskeletal: Osteophytosis of the thoracic spine with exaggerated kyphosis. Chronic posterior right eleventh and anterolateral fourth rib fractures. No acute osseous abnormality. IMPRESSION: 1. Bilateral multilobar pneumonia involving the right upper, middle and both lower lobes with air bronchograms and reactive adenopathy. Small parapneumonic effusions are noted bilaterally. 2. Coronary arteriosclerosis and aortic atherosclerosis. 3. Mediastinal lipomatosis. 4. Hepatic steatosis. 5. Status post  cholecystectomy and left nephrectomy. Aortic Atherosclerosis (ICD10-I70.0). Electronically Signed   By: Ashley Royalty M.D.   On: 02/03/2018 18:28   Nm Pulmonary Perf And Vent  Result Date: 02/03/2018 CLINICAL DATA:  Pulmonary embolism EXAM: NUCLEAR MEDICINE VENTILATION - PERFUSION LUNG SCAN TECHNIQUE: Ventilation images were obtained in multiple projections using inhaled aerosol Tc-91m DTPA. Perfusion images were obtained in multiple projections after intravenous injection of Tc-40m-MAA. RADIOPHARMACEUTICALS:  32.5 mCi of Tc-62m DTPA aerosol inhalation and 3.7 mCi Tc74m-MAA IV COMPARISON:  11/02/2017 Correlation: Chest radiograph 02/03/2018 FINDINGS: Ventilation: Central airway deposition of aerosol. Ventilation defects identified in lateral RIGHT mid lung and throughout entire LEFT lower lobe. Perfusion: Much better perfusion and ventilation in the lateral RIGHT mid lung. Perfusion to the LEFT lower lobe much better than ventilation. Remaining perfusion pattern is normal. Perfusion abnormality in LEFT lower lobe is new since the previous exam. Chest radiograph: Enlargement of cardiac silhouette with atelectasis versus consolidation in LEFT lower lobe as well as mild atelectasis in RIGHT mid lung and at RIGHT base IMPRESSION: Much better perfusion than ventilation in lateral RIGHT mid lung and throughout LEFT lower lobe, with remaining perfusion normal. Findings represent a low probability for pulmonary embolism. Electronically Signed   By: Lavonia Dana M.D.   On: 02/03/2018 16:43   Dg Chest Port 1 View  Result Date: 02/06/2018 CLINICAL DATA:  Shortness of breath, right chest pain EXAM: PORTABLE CHEST 1 VIEW COMPARISON:  Chest radiographs dated 02/05/2018. CT chest dated 02/03/2018. FINDINGS: Focal/patchy right midlung opacity. Mild patchy left lower lobe opacity. Mild right basilar opacity. When correlating with prior CT, this likely reflects multifocal pneumonia. Small left pleural effusion. No pneumothorax.  Heart is top-normal in size. IMPRESSION: Suspected multifocal pneumonia, right upper lobe predominant, better evaluated on prior CT. Small left pleural effusion. Electronically Signed   By: Julian Hy M.D.   On: 02/06/2018 10:59    Scheduled Meds: . allopurinol  150 mg Oral Daily  . amiodarone  200 mg Oral Daily  . apixaban  5 mg Oral BID  . budesonide (PULMICORT) nebulizer solution  0.25 mg Nebulization BID  . colchicine  0.6 mg Oral Daily  . diltiazem  240 mg Oral Daily  . escitalopram  20 mg Oral Daily  . furosemide  80 mg Intravenous BID  .  ipratropium-albuterol  3 mL Nebulization Q6H  . levothyroxine  150 mcg Oral QAC breakfast  . mouth rinse  15 mL Mouth Rinse BID  . metoprolol tartrate  12.5 mg Oral BID  . multivitamin with minerals  1 tablet Oral Daily  . nortriptyline  75 mg Oral QHS   Continuous Infusions: . azithromycin 500 mg (02/07/18 1139)  . ceFEPime (MAXIPIME) IV 1 g (02/07/18 0533)     LOS: 5 days    Time spent: 35 minutes

## 2018-02-07 NOTE — Evaluation (Signed)
Physical Therapy Evaluation Patient Details Name: Wayne Booth MRN: [id number] DOB: 1941/02/10 Today's Date: 02/07/2018   History of Present Illness  77 y.o. male with morbid obesity, atrial fibrillation s/p recent ablation, OSA noncompliant with CPAP, hypothyroidism, prior PE, renal and prostate cancer s/p nephrectomy here with multilobar pneumonia and acute on chronic diastolic heart failure.  Clinical Impression  Patient presents with decreased independence with mobility due to weakness, decreased activity tolerance, decreased balance and limited cardiorespiratory endurance.  He will benefit from skilled PT in the acute setting to allow return home with family support following SNF level rehab stay.    Follow Up Recommendations SNF;Supervision/Assistance - 24 hour    Equipment Recommendations  Rolling walker with 5" wheels(versus rollator)    Recommendations for Other Services       Precautions / Restrictions Precautions Precautions: Fall Precaution Comments: O2 dependent; watch SpO2      Mobility  Bed Mobility               General bed mobility comments: up in recliner  Transfers Overall transfer level: Needs assistance Equipment used: 4-wheeled walker Transfers: Sit to/from Stand Sit to Stand: Min assist         General transfer comment: UE use on armrests, assist for balance, safety, walker use  Ambulation/Gait Ambulation/Gait assistance: Min assist Gait Distance (Feet): 45 Feet Assistive device: 4-wheeled walker Gait Pattern/deviations: Step-through pattern;Step-to pattern;Decreased stride length     General Gait Details: slow pace, hesitant and cues for continuing and to get to goal; seated in rollator in hallway SpO2 69% on 4L O2, increased to 6-8L and cues for  PLB back up to 91% after 2-3 minutes. RN aware, SPT to recliner and wheeled back to room  Stairs            Wheelchair Mobility    Modified Rankin (Stroke Patients Only)        Balance Overall balance assessment: Needs assistance   Sitting balance-Leahy Scale: Good     Standing balance support: Bilateral upper extremity supported Standing balance-Leahy Scale: Poor Standing balance comment: unstable without UE support                             Pertinent Vitals/Pain Pain Assessment: No/denies pain    Home Living Family/patient expects to be discharged to:: Private residence Living Arrangements: Spouse/significant other   Type of Home: House Home Access: Level entry     Home Layout: Two level;Able to live on main level with bedroom/bathroom Home Equipment: Cane - single point      Prior Function Level of Independence: Independent               Hand Dominance        Extremity/Trunk Assessment        Lower Extremity Assessment Lower Extremity Assessment: Overall WFL for tasks assessed;LLE deficits/detail LLE Deficits / Details: edema and redness L LE       Communication   Communication: No difficulties  Cognition Arousal/Alertness: Awake/alert Behavior During Therapy: WFL for tasks assessed/performed Overall Cognitive Status: Within Functional Limits for tasks assessed                                        General Comments      Exercises     Assessment/Plan    PT Assessment Patient needs continued  PT services  PT Problem List Decreased strength;Decreased mobility;Decreased activity tolerance;Decreased balance;Decreased knowledge of use of DME       PT Treatment Interventions DME instruction;Therapeutic activities;Gait training;Therapeutic exercise;Patient/family education;Balance training;Functional mobility training    PT Goals (Current goals can be found in the Care Plan section)  Acute Rehab PT Goals Patient Stated Goal: to return to independent PT Goal Formulation: With patient/family Time For Goal Achievement: 02/21/18 Potential to Achieve Goals: Good    Frequency Min  3X/week   Barriers to discharge        Co-evaluation               AM-PAC PT "6 Clicks" Daily Activity  Outcome Measure Difficulty turning over in bed (including adjusting bedclothes, sheets and blankets)?: A Lot Difficulty moving from lying on back to sitting on the side of the bed? : Unable Difficulty sitting down on and standing up from a chair with arms (e.g., wheelchair, bedside commode, etc,.)?: Unable Help needed moving to and from a bed to chair (including a wheelchair)?: A Little Help needed walking in hospital room?: A Little Help needed climbing 3-5 steps with a railing? : Total 6 Click Score: 11    End of Session Equipment Utilized During Treatment: Gait belt;Oxygen Activity Tolerance: Treatment limited secondary to medical complications (Comment)(hypoxic with ambulation on 4L O2) Patient left: with call bell/phone within reach;in chair;with family/visitor present Nurse Communication: Other (comment)(hypoxic) PT Visit Diagnosis: Other abnormalities of gait and mobility (R26.89);Muscle weakness (generalized) (M62.81)    Time: 4462-8638 PT Time Calculation (min) (ACUTE ONLY): 26 min   Charges:   PT Evaluation $PT Eval Moderate Complexity: 1 Mod PT Treatments $Gait Training: 8-22 mins        [place], [place] [phone] 02/07/2018   Reginia Naas 02/07/2018, 12:22 PM

## 2018-02-07 NOTE — Progress Notes (Signed)
RT called to patient's room to evaluate patient and see if he needed to be put back on bipap.  Patient was struggling to talk, accessory muscle use, trouble keeping sats up.  RT placed patient back on bipap.

## 2018-02-07 NOTE — Progress Notes (Signed)
Progress Note  Patient Name: Wayne Booth Date of Encounter: 02/07/2018  Primary Cardiologist: Wayne Grooms, MD   Subjective   He had acute shortness of breath overnight and was unable to tolerate his BiPAP machine.  He had to keep taking the mask off.  He reports orthopnea.  Breathing is better this morning.  No chest pain or pressure.  Inpatient Medications    Scheduled Meds: . allopurinol  150 mg Oral Daily  . amiodarone  200 mg Oral Daily  . apixaban  5 mg Oral BID  . budesonide (PULMICORT) nebulizer solution  0.25 mg Nebulization BID  . colchicine  0.6 mg Oral Daily  . diltiazem  240 mg Oral Daily  . escitalopram  20 mg Oral Daily  . furosemide  80 mg Intravenous BID  . ipratropium-albuterol  3 mL Nebulization Q6H  . levothyroxine  150 mcg Oral QAC breakfast  . mouth rinse  15 mL Mouth Rinse BID  . metoprolol tartrate  12.5 mg Oral BID  . multivitamin with minerals  1 tablet Oral Daily  . nortriptyline  75 mg Oral QHS   Continuous Infusions: . azithromycin 500 mg (02/06/18 1110)  . ceFEPime (MAXIPIME) IV 1 g (02/07/18 0533)   PRN Meds: acetaminophen **OR** acetaminophen, ALPRAZolam, nitroGLYCERIN, ondansetron **OR** ondansetron (ZOFRAN) IV, zolpidem   Vital Signs    Vitals:   02/07/18 0252 02/07/18 0400 02/07/18 0427 02/07/18 0500  BP: 130/72 121/73    Pulse: 86  89   Resp: (!) 26   (!) 22  Temp: 97.8 F (36.6 C)     TempSrc: Axillary     SpO2: 94% 93% 95% 95%  Weight: 133.4 kg     Height:        Intake/Output Summary (Last 24 hours) at 02/07/2018 0815 Last data filed at 02/06/2018 2300 Gross per 24 hour  Intake 1030 ml  Output 3150 ml  Net -2120 ml   Filed Weights   02/05/18 0635 02/06/18 0551 02/07/18 0252  Weight: 133.6 kg 133.4 kg 133.4 kg    Telemetry    Sinus rhythm.  No events.- Personally Reviewed  ECG    02/06/2018: Sinus rhythm.  Rate 92 bpm.  Incomplete left bundle branch block.  QTC 500 ms.- Personally Reviewed  Physical  Exam   VS:  BP (!) 126/112 (BP Location: Left Arm)   Pulse 86   Temp 99 F (37.2 C) (Oral)   Resp 20   Ht 5\' 11"  (1.803 m)   Wt 133.4 kg   SpO2 (!) 79%   BMI 41.02 kg/m  , BMI Body mass index is 41.02 kg/m. GENERAL:  Well appearing.  Morbidly obese HEENT: Pupils equal round and reactive, fundi not visualized, oral mucosa unremarkable NECK:  JVD difficult to assess.  Carotid upstroke brisk and symmetric, no bruits, no thyromegaly LYMPHATICS:  No cervical adenopathy LUNGS:  Poor air movement.  Diffuse wheezing. HEART:  RRR.  PMI not displaced or sustained,S1 and S2 within normal limits, no S3, no S4, no clicks, no rubs, no murmurs ABD:  Flat, positive bowel sounds normal in frequency in pitch, no bruits, no rebound, no guarding, no midline pulsatile mass, no hepatomegaly, no splenomegaly EXT:  2 plus pulses throughout, 1+ LE edema to upper tibia bilaterally, no cyanosis no clubbing SKIN:  No rashes no nodules NEURO:  Cranial nerves II through XII grossly intact, motor grossly intact throughout PSYCH:  Cognitively intact, oriented to person place and time   Labs  Chemistry Recent Labs  Lab 02/04/18 0730 02/05/18 0423 02/06/18 0340 02/07/18 0320  NA 134* 132* 132* 134*  K 3.9 3.8 3.2* 3.7  CL 96* 97* 94* 96*  CO2 27 22 28 30   GLUCOSE 101* 98 116* 111*  BUN 15 15 18 17   CREATININE 1.55* 1.49* 1.56* 1.48*  CALCIUM 8.7* 8.3* 8.3* 8.5*  ALBUMIN 2.8*  --  2.5*  --   GFRNONAA 42* 44* 42* 45*  GFRAA 49* 51* 48* 52*  ANIONGAP 11 13 10 8      Hematology Recent Labs  Lab 02/05/18 0423 02/06/18 0340 02/07/18 0320  WBC 13.0* 12.8* 14.1*  RBC 4.18* 3.98* 4.15*  HGB 12.4* 11.8* 12.4*  HCT 38.0* 36.1* 37.7*  MCV 90.9 90.7 90.8  MCH 29.7 29.6 29.9  MCHC 32.6 32.7 32.9  RDW 14.2 14.3 14.1  PLT 327 338 384    Cardiac Enzymes Recent Labs  Lab 02/03/18 0351 02/03/18 0902 02/03/18 1720  TROPONINI 0.03* 0.03* <0.03    Recent Labs  Lab 02/02/18 1819  TROPIPOC 0.12*       BNP Recent Labs  Lab 02/02/18 1727  BNP 54.3     DDimer  Recent Labs  Lab 02/03/18 1218  DDIMER 1.58*     Radiology    Dg Chest 2 View  Result Date: 02/05/2018 CLINICAL DATA:  Short of breath. Mid chest pain for a couple of days. EXAM: CHEST - 2 VIEW COMPARISON:  02/03/2018 and older exams. FINDINGS: Hazy airspace consolidation is noted on the right predominantly in the mid lung. There is basilar consolidation, greater on the left. These findings are stable from the previous exam and previous chest CT. No new lung abnormalities. Heart is normal in size. No convincing mediastinal or hilar masses. Small effusion seen on the prior CT are not well-defined radiographically. They are likely unchanged. No pneumothorax. IMPRESSION: 1. No significant change in lung aeration from the most recent prior exams. Bilateral airspace lung opacities consistent with multifocal pneumonia. Electronically Signed   By: Wayne Booth M.D.   On: 02/05/2018 18:33   Dg Chest Port 1 View  Result Date: 02/06/2018 CLINICAL DATA:  Shortness of breath, right chest pain EXAM: PORTABLE CHEST 1 VIEW COMPARISON:  Chest radiographs dated 02/05/2018. CT chest dated 02/03/2018. FINDINGS: Focal/patchy right midlung opacity. Mild patchy left lower lobe opacity. Mild right basilar opacity. When correlating with prior CT, this likely reflects multifocal pneumonia. Small left pleural effusion. No pneumothorax. Heart is top-normal in size. IMPRESSION: Suspected multifocal pneumonia, right upper lobe predominant, better evaluated on prior CT. Small left pleural effusion. Electronically Signed   By: Wayne Booth M.D.   On: 02/06/2018 10:59    Cardiac Studies     Patient Profile     77 y.o. male with morbid obesity, atrial fibrillation s/p recent ablation, OSA noncompliant with CPAP, hypothyroidism, prior PE, renal and prostate cancer s/p nephrectomy here with multilobar pneumonia and acute on chronic diastolic heart  failure.  Assessment & Plan    # Multilobar pneumonia:  # Hypoxic respiratory failure: Antibiotics per IM.  # Acute on chronic diastolic heart failure: # Hypertension: BNP was only 54 but this is due to obesity.  Wayne Booth remains volume overloaded.  He is diuresing well and was net -2 L yesterday.  His renal function remained stable.  Continue diuresis with IV Lasix.  His blood pressure is well-controlled on diltiazem and metoprolol.  # AKI:  Renal function improving with diuresis.  # Paroxysmal atrial fibrillation: S/p  ablation.  Currently in sinus rhythm.  Continue amiodarone, metoprolol, diltiazem, and Eliquis.      For questions or updates, please contact [place] Please consult [email or web address] for contact info under Cardiology/STEMI.      Signed, Skeet Latch, MD  02/07/2018, 8:15 AM

## 2018-02-07 NOTE — Consult Note (Addendum)
Name: Wayne Booth MRN: [id number] DOB: 1940/09/22    ADMISSION DATE:  02/02/2018 CONSULTATION DATE:  02/07/18  REFERRING MD :  Dr. Jonnie Finner / [facility]  CHIEF COMPLAINT:  Shortness of Breath   HISTORY OF PRESENT ILLNESS:  77 y/o M admitted on 8/7 with reports of shortness of breath and chest pain.    Patient carries a medical history of atrial fibrillation s/p ablation (8/2), hypertension, OSA, hypothyroidism, PE on apixaban and nephrectomy for renal cell carcinoma.  he reports increasing shortness of breath with minimal exertion for approximately two weeks prior to presentation.  Additionally he had been having right-sided chest pain.  ER work-up notable for fever and chest x-ray concerning for infiltrate.  The patient was admitted per [facility] for possible pneumonia.  He was treated with empiric antibiotics-cefepime & vancomycin.  Due to BiPAP requirements he was placed on heparin drip instead of apixaban.  He reported on admission poor food intake but had a 14 pound weight gain 2 weeks prior to admission despite an increase in his diuretics.  H reported increased PO liquid intake - "very thirsty".  VQ scan was completed which showed low probability for pulmonary embolism.  CT of the chest without contrast was suggestive of pneumonia.  Course complicated by mild acute kidney injury which has improved.  Troponin negative.  TTE elevated at 0.63.  Azithromycin added 8/11 for atypical coverage.  On 8/12 PCCM consulted for possible bronchoscopy for pneumonia. He is a never smoker.  Denies sick contacts.  Reports cough but no significant sputum production.  Notes increased swelling in LE's, redness on left with warmth.  Denies choking / difficulty swallowing.   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   has a past surgical history that includes Appendectomy; Cholecystectomy; Nephrectomy (Left); Insertion prostate radiation seed; Replacement total knee bilateral; Knee arthroscopy (2007); Anterior cervical decomp/discectomy fusion (N/A, 08/29/2012); Craniotomy (N/A, 11/08/2012); Colonoscopy w/ polypectomy; Kidney stone surgery; Cardiac catheterization (2008); Posterior cervical laminectomy (Left, 04/24/2015); Finger surgery (Left, 2017); Ablation of dysrhythmic focus (01/28/2018); and ATRIAL FIBRILLATION ABLATION (N/A, 01/28/2018).  Prior to Admission medications   Medication Sig Start Date End Date Taking? Authorizing Provider  albuterol (PROVENTIL HFA;VENTOLIN HFA) 108 (90 Base) MCG/ACT inhaler Inhale 2 puffs into the lungs every 6 (six) hours as needed for wheezing or shortness of breath. 08/11/17  Yes Rigoberto Noel, MD  allopurinol (ZYLOPRIM) 300 MG tablet Take 0.5 tablets (150 mg total) by mouth daily. 12/07/17  Yes Rai, Ripudeep K, MD  amiodarone (PACERONE) 200 MG tablet Take 1 tablet (200 mg total) by mouth daily. 01/28/18  Yes Camnitz, Ocie Doyne, MD  apixaban (ELIQUIS) 5 MG TABS tablet Take 1 tablet (5 mg total) by mouth 2 (two) times daily. 11/03/17  Yes Georgette Shell, MD  budesonide-formoterol [facility]) 160-4.5 MCG/ACT inhaler Inhale 2 puffs into the lungs 2 (two) times daily as needed (for respiratory flares).   Yes [provider]  clotrimazole-betamethasone (LOTRISONE) cream Apply 1 application topically 2 (two) times daily. Use for 10 days maximum. Patient taking differently: Apply 1 application topically 2 (two) times daily as needed (for rashes).  08/27/16  Yes Marin Olp, MD  colchicine 0.6 MG tablet Take 1 tablet (  0.6 mg total) by mouth daily. 12/07/17  Yes Rai, Ripudeep K, MD  cyclobenzaprine (FLEXERIL) 10 MG tablet Take  10 mg by mouth daily as needed for muscle spasms.  09/30/17  Yes [provider]  diltiazem (CARDIZEM CD) 240 MG 24 hr capsule Take 1 capsule (240 mg total) by mouth daily. 12/07/17  Yes Rai, Ripudeep K, MD  escitalopram (LEXAPRO) 20 MG tablet TAKE ONE TABLET BY MOUTH DAILY 01/27/18  Yes Marin Olp, MD  furosemide (LASIX) 20 MG tablet Take 1 tablet (20 mg total) by mouth daily. 12/07/17  Yes Rai, Ripudeep K, MD  levothyroxine (SYNTHROID, LEVOTHROID) 150 MCG tablet TAKE ONE TABLET BY MOUTH DAILY 07/06/17  Yes Marin Olp, MD  metoprolol tartrate (LOPRESSOR) 25 MG tablet Take 0.5 tablets (12.5 mg total) by mouth 2 (two) times daily. 12/07/17  Yes Rai, Vernelle Emerald, MD  Multiple Vitamins-Minerals (MULTIVITAMINS THER. W/MINERALS) TABS Take 1 tablet by mouth daily.   Yes [provider]  nitroGLYCERIN (NITROSTAT) 0.4 MG SL tablet Place 1 tablet (0.4 mg total) under the tongue every 5 (five) minutes as needed for chest pain. 12/07/17  Yes Rai, Ripudeep K, MD  nortriptyline (PAMELOR) 75 MG capsule TAKE ONE CAPSULE BY MOUTH AT BEDTIME 12/31/17  Yes Marin Olp, MD  Suvorexant (BELSOMRA) 10 MG TABS Take 10 mg by mouth at bedtime. 12/27/17  Yes Marin Olp, MD    No Known Allergies  FAMILY HISTORY:  family history includes Cancer in his father and mother; Colon cancer (age of onset: 43) in his brother; Kidney cancer in his sister.  SOCIAL HISTORY:  reports that he has quit smoking. His smoking use included cigars. He quit after 4.00 years of use. He has never used smokeless tobacco. He reports that he drinks about 2.0 standard drinks of alcohol per week. He reports that he does not use drugs.  REVIEW OF SYSTEMS:  POSITIVES IN BOLD Constitutional: Negative for fever, chills, weight loss, malaise/fatigue and diaphoresis.  HENT: Negative for hearing loss, ear pain, nosebleeds, congestion, sore throat, neck pain, tinnitus and ear discharge.   Eyes: Negative for blurred vision,  double vision, photophobia, pain, discharge and redness.  Respiratory: Negative for cough, hemoptysis, sputum production, shortness of breath, right sided chest pain wheezing and stridor.   Cardiovascular: Negative for chest pain, palpitations, orthopnea, claudication, leg swelling and PND.  Gastrointestinal: Negative for heartburn, nausea, vomiting, abdominal pain, diarrhea, constipation, blood in stool and melena.  Genitourinary: Negative for dysuria, urgency, frequency, hematuria and flank pain.  Musculoskeletal: Negative for myalgias, back pain, joint pain and falls.  Skin: Negative for itching and rash.  Neurological: Negative for dizziness, tingling, tremors, sensory change, speech change, focal weakness, seizures, loss of consciousness, weakness and headaches.  Endo/Heme/Allergies: Negative for environmental allergies and polydipsia. Does not bruise/bleed easily.   SUBJECTIVE:   VITAL SIGNS: Temp:  [97.8 F (36.6 C)-100.3 F (37.9 C)] 99 F (37.2 C) (08/12 0856) Pulse Rate:  [86-89] 86 (08/12 0856) Resp:  [17-27] 20 (08/12 0856) BP: (110-134)/(68-112) 126/112 (08/12 0856) SpO2:  [79 %-96 %] 79 % (08/12 0856) FiO2 (%):  [40 %] 40 % (08/12 0129) Weight:  [133.4 kg] 133.4 kg (08/12 0252)  PHYSICAL EXAMINATION: General: obese elderly male in NAD, sitting up in chair  Neuro:  AAOx4, speech clear, MAE  HEENT:  MM pink/moist, short neck, ruddy cheeks Cardiovascular:  s1s2 rrr, SR in 80's on monitor Lungs: mild dyspnea, lungs bilaterally clear anterior, diminished lower Abdomen:  Obese/protruberant, bsx4  active  Musculoskeletal:  No acute deformities  Skin:  Warm/dry, BLE 1-2+ pitting edema, LLE mild erythema / warmth   Recent Labs  Lab 02/05/18 0423 02/06/18 0340 02/07/18 0320  NA 132* 132* 134*  K 3.8 3.2* 3.7  CL 97* 94* 96*  CO2 22 28 30   BUN 15 18 17   CREATININE 1.49* 1.56* 1.48*  GLUCOSE 98 116* 111*    Recent Labs  Lab 02/05/18 0423 02/06/18 0340  02/07/18 0320  HGB 12.4* 11.8* 12.4*  HCT 38.0* 36.1* 37.7*  WBC 13.0* 12.8* 14.1*  PLT 327 338 384    Dg Chest 2 View  Result Date: 02/05/2018 CLINICAL DATA:  Short of breath. Mid chest pain for a couple of days. EXAM: CHEST - 2 VIEW COMPARISON:  02/03/2018 and older exams. FINDINGS: Hazy airspace consolidation is noted on the right predominantly in the mid lung. There is basilar consolidation, greater on the left. These findings are stable from the previous exam and previous chest CT. No new lung abnormalities. Heart is normal in size. No convincing mediastinal or hilar masses. Small effusion seen on the prior CT are not well-defined radiographically. They are likely unchanged. No pneumothorax. IMPRESSION: 1. No significant change in lung aeration from the most recent prior exams. Bilateral airspace lung opacities consistent with multifocal pneumonia. Electronically Signed   By: Lajean Manes M.D.   On: 02/05/2018 18:33   Dg Chest Port 1 View  Result Date: 02/06/2018 CLINICAL DATA:  Shortness of breath, right chest pain EXAM: PORTABLE CHEST 1 VIEW COMPARISON:  Chest radiographs dated 02/05/2018. CT chest dated 02/03/2018. FINDINGS: Focal/patchy right midlung opacity. Mild patchy left lower lobe opacity. Mild right basilar opacity. When correlating with prior CT, this likely reflects multifocal pneumonia. Small left pleural effusion. No pneumothorax. Heart is top-normal in size. IMPRESSION: Suspected multifocal pneumonia, right upper lobe predominant, better evaluated on prior CT. Small left pleural effusion. Electronically Signed   By: Julian Hy M.D.   On: 02/06/2018 10:59      SIGNIFICANT EVENTS  8/07 Admit with shortness of breath and pleuritic right-sided chest pain  STUDIES CT Chest w/o 8/8 >> bilateral multi lobar pneumonia involving the right upper, middle and bilateral lower lobes with air bronchograms and reactive adenopathy.  Small lesions bilaterally.  He mediastinal  lipomatosis, hepatic steatosis, status post cholecystectomy and left nephrectomy. VQ 8/8 >> low probability pulmonary embolism, better perfusion than ventilation in the lateral right mid lung and throughout left lower lobe with remaining normal perfusion ECHO 8/9 >> LVEF ~50-55%, difficult study due to body habitus, no gross wall motion abnormalities   CULTURES BCx2 8/7 >> negative  Sputum 8/8 >> negative   ANTIBIOTICS  Cefepime 8/7 >> Vanco 8/7 >> Azithromycin 8/11 >>   ASSESSMENT / PLAN:  Discussion: 77 year old male admitted with plaints of shortness of breath and pleuritic chest pain.  Work-up consistent with multilobar pneumonia, + component of decompensated CHF.  Cultures negative to date.     Multilobar Pneumonia  Pleuritic Chest Pain  P: Continue antibiotics, D6/7-10 Will need repeat imaging as outpatient to ensure clearance > can be done with PCP or Pulmonary  No role for bronchoscopy at this time  Follow CXR intermittently  Incentive spirometry  Pulmonary hygiene    Acute Hypoxic Respiratory Failure - in setting of multilobar PNA P: Continue O2 as needed to support sats >90% Assess ambulatory O2 needs prior to discharge    OSA - has known about OSA for "long time" but did not  wear, recently got a machine and had been wearing for 2 days prior to admit.  ECHO unable to assess pulmonary pressures.  ? Component of PH?.  P: CPAP QHS and PRN daytime sleep if patient will allow    Hx PE - ruled out this admit, on apixiban  P: Continue anticoagulation    Hx HTN, HLD, AF  P: Per Primary / Cardiology    AKI P: Per Primary    Morbid Obesity  P: Diet education  Increase exercise as able  Consider cardiopulmonary rehab    PCCM will sign off.  Please call back if new needs arise.   Noe Gens, NP-C [facility] Pgr: [phone] or if no answer [phone] 02/07/2018, 12:02 PM  Attending Note:  77 year old male with extensive PMH  including morbid obesity, OSA, CHF and pneumonia who presents to [facility] for respiratory failure.  On exam, decrease BS at the bases.  I reviewed CXR myself, low lung volumes and pulmonary edema noted.  Discussed with PCCM-NP.  Pneumonia: agree with abx choices, patient is very chronically ill and will likely need more time  - Continue abx as ordered  - F/u on cultures  OSA: non-compliant with CPAP  - Strongly recommend CPAP use, recommendation made to patient  Pulmonary edema:  - Lasix as ordered  - Keep as dry as able  Hypoxemia:  - Titrate O2 for sat of 88-92%  - Will need an ambulatory desat study for home O2 prior to discharge  PCCM will sign off, please call back if needed.  Patient seen and examined, agree with above note.  I dictated the care and orders written for this patient under my direction.  Rush Farmer, [place]

## 2018-02-07 NOTE — Progress Notes (Signed)
Patient less tolerant of BiPAP this evening. Offered sips of water throughout evening and patient encouraged the maintain BiPAP. Slightly disoriented to situation, but easily reoriented .Marland Kitchen Question whether due to sleeping med or some other factor is unclear. Resting well at this point.  Of note: questionable cellulitis noted on patients LLE around ankle.

## 2018-02-08 DIAGNOSIS — J189 Pneumonia, unspecified organism: Secondary | ICD-10-CM

## 2018-02-08 DIAGNOSIS — R0609 Other forms of dyspnea: Secondary | ICD-10-CM

## 2018-02-08 DIAGNOSIS — R0600 Dyspnea, unspecified: Secondary | ICD-10-CM

## 2018-02-08 DIAGNOSIS — J181 Lobar pneumonia, unspecified organism: Secondary | ICD-10-CM

## 2018-02-08 LAB — CBC
HCT: 37.7 % — ABNORMAL LOW (ref 39.0–52.0)
Hemoglobin: 12.1 g/dL — ABNORMAL LOW (ref 13.0–17.0)
MCH: 29.1 pg (ref 26.0–34.0)
MCHC: 32.1 g/dL (ref 30.0–36.0)
MCV: 90.6 fL (ref 78.0–100.0)
Platelets: 419 10*3/uL — ABNORMAL HIGH (ref 150–400)
RBC: 4.16 MIL/uL — ABNORMAL LOW (ref 4.22–5.81)
RDW: 14.3 % (ref 11.5–15.5)
WBC: 14.1 10*3/uL — ABNORMAL HIGH (ref 4.0–10.5)

## 2018-02-08 LAB — BASIC METABOLIC PANEL
Anion gap: 11 (ref 5–15)
BUN: 20 mg/dL (ref 8–23)
CHLORIDE: 94 mmol/L — AB (ref 98–111)
CO2: 28 mmol/L (ref 22–32)
CREATININE: 1.47 mg/dL — AB (ref 0.61–1.24)
Calcium: 8.4 mg/dL — ABNORMAL LOW (ref 8.9–10.3)
GFR calc Af Amer: 52 mL/min — ABNORMAL LOW (ref >60)
GFR calc non Af Amer: 45 mL/min — ABNORMAL LOW (ref >60)
Glucose, Bld: 103 mg/dL — ABNORMAL HIGH (ref 70–99)
Potassium: 3.5 mmol/L (ref 3.5–5.1)
Sodium: 133 mmol/L — ABNORMAL LOW (ref 135–145)

## 2018-02-08 LAB — CULTURE, BLOOD (ROUTINE X 2)
Culture: NO GROWTH
Special Requests: ADEQUATE

## 2018-02-08 MED ORDER — DOCUSATE SODIUM 100 MG PO CAPS
200.0000 mg | ORAL_CAPSULE | Freq: Every day | ORAL | Status: DC
  Administered 2018-02-08 – 2018-02-11 (×4): 200 mg via ORAL
  Filled 2018-02-08 (×5): qty 2

## 2018-02-08 MED ORDER — TRAZODONE HCL 50 MG PO TABS
50.0000 mg | ORAL_TABLET | Freq: Every evening | ORAL | Status: DC | PRN
  Administered 2018-02-08 – 2018-02-09 (×2): 50 mg via ORAL
  Filled 2018-02-08 (×4): qty 1

## 2018-02-08 MED ORDER — ALUM & MAG HYDROXIDE-SIMETH 200-200-20 MG/5ML PO SUSP
30.0000 mL | ORAL | Status: DC | PRN
  Administered 2018-02-08: 30 mL via ORAL
  Filled 2018-02-08 (×2): qty 30

## 2018-02-08 MED ORDER — POLYVINYL ALCOHOL 1.4 % OP SOLN
1.0000 [drp] | OPHTHALMIC | Status: DC | PRN
  Administered 2018-02-12 – 2018-02-18 (×4): 1 [drp] via OPHTHALMIC
  Filled 2018-02-08: qty 15

## 2018-02-08 MED ORDER — SUVOREXANT 10 MG PO TABS
10.0000 mg | ORAL_TABLET | Freq: Every day | ORAL | Status: DC
  Administered 2018-02-09 – 2018-02-11 (×3): 10 mg via ORAL

## 2018-02-08 MED ORDER — FUROSEMIDE 80 MG PO TABS
80.0000 mg | ORAL_TABLET | Freq: Every day | ORAL | Status: DC
  Administered 2018-02-09: 80 mg via ORAL
  Filled 2018-02-08: qty 1

## 2018-02-08 NOTE — Progress Notes (Signed)
Wayne Booth paged the following at 2010:  "2W19:[place].Suvorexant unavailable at this hospital.Pt. dose not respond well to [place].Pharm suggesting melatonin or trazadone.also stool softner?"

## 2018-02-08 NOTE — Progress Notes (Signed)
Progress Note  Patient Name: Wayne Booth Date of Encounter: 02/08/2018  Primary Cardiologist: Larae Grooms, MD   Subjective   Breathing is getting better slowly.  No chest pain  Inpatient Medications    Scheduled Meds: . allopurinol  150 mg Oral Daily  . amiodarone  200 mg Oral Daily  . apixaban  5 mg Oral BID  . budesonide (PULMICORT) nebulizer solution  0.25 mg Nebulization BID  . colchicine  0.6 mg Oral Daily  . diltiazem  240 mg Oral Daily  . escitalopram  20 mg Oral Daily  . furosemide  80 mg Intravenous BID  . ipratropium-albuterol  3 mL Nebulization Q6H  . levothyroxine  150 mcg Oral QAC breakfast  . mouth rinse  15 mL Mouth Rinse BID  . metoprolol tartrate  12.5 mg Oral BID  . multivitamin with minerals  1 tablet Oral Daily  . nortriptyline  75 mg Oral QHS  . Suvorexant  10 mg Oral QHS   Continuous Infusions: . azithromycin 500 mg (02/07/18 1139)  . ceFEPime (MAXIPIME) IV 1 g (02/08/18 0615)   PRN Meds: acetaminophen **OR** acetaminophen, ALPRAZolam, nitroGLYCERIN, ondansetron **OR** ondansetron (ZOFRAN) IV   Vital Signs    Vitals:   02/08/18 0740 02/08/18 0741 02/08/18 0754 02/08/18 0900  BP:   122/68   Pulse:   86   Resp:   (!) 22   Temp:   98.5 F (36.9 C)   TempSrc:   Axillary   SpO2: 94% 94% 96%   Weight:    122.3 kg  Height:        Intake/Output Summary (Last 24 hours) at 02/08/2018 1130 Last data filed at 02/08/2018 0615 Gross per 24 hour  Intake 1110 ml  Output 900 ml  Net 210 ml   Filed Weights   02/06/18 0551 02/07/18 0252 02/08/18 0900  Weight: 133.4 kg 133.4 kg 122.3 kg    Telemetry    Sinus rhythm.  No events.- Personally Reviewed  ECG    02/06/2018: Sinus rhythm.  Rate 92 bpm.  Incomplete left bundle branch block.  QTC 500 ms.- Personally Reviewed  Physical Exam   VS:  BP 122/68 (BP Location: Left Arm)   Pulse 86   Temp 98.5 F (36.9 C) (Axillary)   Resp (!) 22   Ht 5\' 11"  (1.803 m)   Wt 122.3 kg   SpO2  96%   BMI 37.60 kg/m  , BMI Body mass index is 37.6 kg/m. GENERAL:  Ill-appearing.  Morbidly obese HEENT: Pupils equal round and reactive, fundi not visualized, oral mucosa unremarkable NECK:  JVD difficult to assess.  Carotid upstroke brisk and symmetric, no bruits, no thyromegaly LYMPHATICS:  No cervical adenopathy LUNGS:  Poor air movement.  Diffuse wheezing. HEART:  RRR.  PMI not displaced or sustained,S1 and S2 within normal limits, no S3, no S4, no clicks, no rubs, no murmurs ABD:  Flat, positive bowel sounds normal in frequency in pitch, no bruits, no rebound, no guarding, no midline pulsatile mass, no hepatomegaly, no splenomegaly EXT:  2 plus pulses throughout, trace LE edema to upper tibia bilaterally, no cyanosis no clubbing SKIN:  No rashes no nodules NEURO:  Cranial nerves II through XII grossly intact, motor grossly intact throughout PSYCH:  Cognitively intact, oriented to person place and time   Labs    Chemistry Recent Labs  Lab 02/04/18 0730 02/05/18 0423 02/06/18 0340 02/07/18 0320  NA 134* 132* 132* 134*  K 3.9 3.8 3.2* 3.7  CL  96* 97* 94* 96*  CO2 27 22 28 30   GLUCOSE 101* 98 116* 111*  BUN 15 15 18 17   CREATININE 1.55* 1.49* 1.56* 1.48*  CALCIUM 8.7* 8.3* 8.3* 8.5*  ALBUMIN 2.8*  --  2.5*  --   GFRNONAA 42* 44* 42* 45*  GFRAA 49* 51* 48* 52*  ANIONGAP 11 13 10 8      Hematology Recent Labs  Lab 02/06/18 0340 02/07/18 0320 02/08/18 0300  WBC 12.8* 14.1* 14.1*  RBC 3.98* 4.15* 4.16*  HGB 11.8* 12.4* 12.1*  HCT 36.1* 37.7* 37.7*  MCV 90.7 90.8 90.6  MCH 29.6 29.9 29.1  MCHC 32.7 32.9 32.1  RDW 14.3 14.1 14.3  PLT 338 384 419*    Cardiac Enzymes Recent Labs  Lab 02/03/18 0351 02/03/18 0902 02/03/18 1720  TROPONINI 0.03* 0.03* <0.03    Recent Labs  Lab 02/02/18 1819  TROPIPOC 0.12*     BNP Recent Labs  Lab 02/02/18 1727  BNP 54.3     DDimer  Recent Labs  Lab 02/03/18 1218  DDIMER 1.58*     Radiology    No results  found.  Cardiac Studies     Patient Profile     77 y.o. male with morbid obesity, atrial fibrillation s/p recent ablation, OSA noncompliant with CPAP, hypothyroidism, prior PE, renal and prostate cancer s/p nephrectomy here with multilobar pneumonia and acute on chronic diastolic heart failure.  Assessment & Plan    # Multilobar pneumonia:  # Hypoxic respiratory failure: Antibiotics per IM.  No plans for bronch per Pulmonology.  # Acute on chronic diastolic heart failure: # Hypertension: BNP was only 54 but this is due to obesity.  Volume status is difficult to assess.  For unclear reasons he didn't get the BMP that was ordered this AM.  He appears to be euvolemic.  He was net -2.1L yesteray.  We will switch lasix to 80mg  oral to start tomorrow.   # AKI:  Renal function improving with diuresis.  Awaiting BMP today.  Switch to oral lasix as above.   # Paroxysmal atrial fibrillation: S/p ablation.  Currently in sinus rhythm.  Continue amiodarone, metoprolol, diltiazem, and Eliquis.      For questions or updates, please contact [place] Please consult [email or web address] for contact info under Cardiology/STEMI.      Signed, Skeet Latch, MD  02/08/2018, 11:30 AM

## 2018-02-08 NOTE — Discharge Instructions (Signed)

## 2018-02-08 NOTE — Progress Notes (Addendum)
PROGRESS NOTE    Wayne Booth  YQM:[id number] DOB: February 21, 1941 DOA: 02/02/2018  PCP: Marin Olp, MD   Brief Narrative:  77 year old male, morbidly obese, with past medical history significant for atrial fibrillation status post recent ablation, OSA but noncompliant with CPAP, hypothyroidism, history of pulmonary embolism, hypertension, anxiety, nephrolithiasis, documented renal and prostate cancer.  Patient is said to have undergone nephrectomy, and has one kidney.  Patient was admitted with shortness of breath, right-sided chest pain, fever, small pericardial effusion and worsening renal function.  CT chest shows multilobar pneumonia.  Patient on broad-spectrum antibiotics cefepime, negative MRSA PCR screen.  D-dimer elevated ruled out for PE with low probability VQ scan, heparin switch to Eliquis. Also element of volume overload, cardiology following diuresing, creatinine improving. Cardiac BMP done on 02/02/2018 was 54.3, and troponin has been 0.03 on 2 occasions.     Assessment & Plan:   Bilateral multilobar lobe pneumonia- on CT 02/03/18.  Low probability VQ scan.  4th day on broad-spectrum antibiotics cefepime, vancomycin discontinued, negative MRSA PCR. -Urine strep and Legionella negative -Patient still hypoxic, never smoker, is on home O2, currently requiring 4 L of O2, still dyspneic.   -Cont Cefepime, added azithromycin for atypical coverage  Hypoxic acute resp failure/ dyspnea- dyspnea combination of underlying COPD (on home O2) w/ new bilat PNA and also suspected diast CHF issues.  BNP 54, but patient morbidly obese - got standing weight today which is more reflective of I/O's, down 10kg. Will get bmet. Diuresing per cardiology on 80 lasix iv bid, good UOP and stable creat so far. Down 10kg from admit. Abd less firm today and LE edema starting to decrease, rales are less today as well.  - will get standing weights only, bed weights have been off - diuresis per cardiology -  fluid restriction 1200 cc  LLE erythema/ edema - doppler done, negative for DVT - outlined w/ marker, erythema better today, cont diuresis /abx for possible cellulitis  Right-sided chest pain- prior history of pulmonary embolism.  V/Q low probability. Patient has history of prostate and renal cancer. - Patient recently underwent ablation by the cardiology team for atrial fibrillation. -02/05/2018: Transitioned heparin drip back to Eliquis. -Treat pneumonia.  Acute kidney injury-initially improving with diuresis, poss cardiorenal, creatinine now stable 1.5, prob new baseline - daily bmet while diuresing  Paroxysmal atrial fibrillation - Continue Cardizem 240 mg qd and amio 200mg  as at home - holding home metoprolol 12.5 bid - recent ablation procedure w/ L groin bruising - on eliquis 5 mg bid  Hypertension, essential - Continue Cardizem 240 qd, metop on hold for now - BP's are normal to low normal  Hypothyroidism - Continue synthroid 150 mcg daily   Morbid obesity/OSA - wore his CPAP/ bipap last night, has machine at home  Anxiety and depression - Continue lexapro and nortriptyline as at home - having side effects/ agitation from Ambien , will dc and label as intolerance - cont xanax tid as needed - takes suvorexant at home for sleep but is not on formulary, will do home meds w/ pharm assist    Kelly Splinter MD [facility] pgr [phone] 11/21/2017, 9:25 AM    DVT prophylaxis: Eliquis. Code Status: full code  Family Communication: wife at bedside Disposition Plan: home once breathing better  Consultants:   Cardiology, Dr. Fransico Him   Procedures:   ECHO - EF 55%  Antimicrobials:   Cefepime 8/8 -->  Azithromycin 8/11>>    Subjective: Respiratory  status still poor. Drinking too much fluid per RN.  2.4 L out yest but wts are up 1kg from [place].   Objective: Vitals:   02/08/18 0740 02/08/18 0741 02/08/18 0754 02/08/18 0900  BP:    122/68   Pulse:   86   Resp:   (!) 22   Temp:   98.5 F (36.9 C)   TempSrc:   Axillary   SpO2: 94% 94% 96%   Weight:    122.3 kg  Height:        Intake/Output Summary (Last 24 hours) at 02/08/2018 1016 Last data filed at 02/08/2018 0615 Gross per 24 hour  Intake 1110 ml  Output 900 ml  Net 210 ml   Filed Weights   02/06/18 0551 02/07/18 0252 02/08/18 0900  Weight: 133.4 kg 133.4 kg 122.3 kg    Examination:  General exam: Appears uncomfortable and drowsy Respiratory system: basilar rales 1/2 up bilat post Cardiovascular system: S1 & S2 heard .  Bilat Pitting pedal edema.  Gastrointestinal system: Abdomen is nondistended, soft and nontender. Organs are difficult to assess. Central nervous system: Alert and oriented. No focal neurological deficits. Extremities: R leg edema is better, but L leg still swollen and mild erythema.   Data Reviewed: I have personally reviewed following labs and imaging studies  CBC: Recent Labs  Lab 02/02/18 2018  02/04/18 0730 02/05/18 0423 02/06/18 0340 02/07/18 0320 02/08/18 0300  WBC 16.6*   < > 13.5* 13.0* 12.8* 14.1* 14.1*  NEUTROABS 12.4*  --  9.5*  --  8.2*  --   --   HGB 13.1   < > 12.5* 12.4* 11.8* 12.4* 12.1*  HCT 39.7   < > 38.6* 38.0* 36.1* 37.7* 37.7*  MCV 91.1   < > 92.1 90.9 90.7 90.8 90.6  PLT 307   < > 319 327 338 384 419*   < > = values in this interval not displayed.   Basic Metabolic Panel: Recent Labs  Lab 02/03/18 0902 02/04/18 0730 02/05/18 0423 02/06/18 0340 02/07/18 0320  NA 132* 134* 132* 132* 134*  K 4.1 3.9 3.8 3.2* 3.7  CL 96* 96* 97* 94* 96*  CO2 22 27 22 28 30   GLUCOSE 135* 101* 98 116* 111*  BUN 16 15 15 18 17   CREATININE 1.64* 1.55* 1.49* 1.56* 1.48*  CALCIUM 8.7* 8.7* 8.3* 8.3* 8.5*  MG  --  2.1  --  2.0  2.0  --   PHOS  --  3.4  --  3.7  --    GFR: Estimated Creatinine Clearance: 57.4 mL/min (A) (by C-G formula based on SCr of 1.48 mg/dL (H)). Liver Function Tests: Recent Labs  Lab  02/04/18 0730 02/06/18 0340  ALBUMIN 2.8* 2.5*   Coagulation Profile: Recent Labs  Lab 02/03/18 0434  INR 1.20   Cardiac Enzymes: Recent Labs  Lab 02/03/18 0351 02/03/18 0902 02/03/18 1720  TROPONINI 0.03* 0.03* <0.03   CBG: Recent Labs  Lab 02/02/18 1811  GLUCAP 85   Urine analysis:    Component Value Date/Time   COLORURINE AMBER (A) 02/03/2018 1620   APPEARANCEUR CLEAR 02/03/2018 1620   LABSPEC 1.016 02/03/2018 1620   PHURINE 5.0 02/03/2018 1620   GLUCOSEU NEGATIVE 02/03/2018 1620   HGBUR NEGATIVE 02/03/2018 1620   HGBUR 2+ 09/22/2007 1444   BILIRUBINUR NEGATIVE 02/03/2018 1620   BILIRUBINUR n 11/07/2015 1417   KETONESUR NEGATIVE 02/03/2018 1620   PROTEINUR NEGATIVE 02/03/2018 1620   UROBILINOGEN 1.0 11/07/2015 1417   UROBILINOGEN 1.0  05/22/2011 1454   NITRITE NEGATIVE 02/03/2018 1620   LEUKOCYTESUR NEGATIVE 02/03/2018 1620   Sepsis Labs: @LABRCNTIP (procalcitonin:4,lacticidven:4)   ) Recent Results (from the past 240 hour(s))  Culture, blood (routine x 2)     Status: None   Collection Time: 02/02/18  5:27 PM  Result Value Ref Range Status   Specimen Description BLOOD BLOOD LEFT FOREARM  Final   Special Requests   Final    BOTTLES DRAWN AEROBIC AND ANAEROBIC Blood Culture results may not be optimal due to an inadequate volume of blood received in culture bottles   Culture   Final    NO GROWTH 5 DAYS Performed at [facility], [place] [address], [place], [place] [address]    Report Status 02/07/2018 FINAL  Final  MRSA PCR Screening     Status: None   Collection Time: 02/03/18  2:44 AM  Result Value Ref Range Status   MRSA by PCR NEGATIVE NEGATIVE Final    Comment:        The GeneXpert MRSA Assay (FDA approved for NASAL specimens only), is one component of a comprehensive MRSA colonization surveillance program. It is not intended to diagnose MRSA infection nor to guide or monitor treatment for MRSA infections. Performed at [facility], [place] [address] [place], [place] [address]   Culture, sputum-assessment     Status: None   Collection Time: 02/03/18  5:08 PM  Result Value Ref Range Status   Specimen Description EXPECTORATED SPUTUM  Final   Special Requests NONE  Final   Sputum evaluation   Final    THIS SPECIMEN IS ACCEPTABLE FOR SPUTUM CULTURE Performed at [facility], [address] [place], [place] [address]    Report Status 02/03/2018 FINAL  Final  Culture, respiratory     Status: None   Collection Time: 02/03/18  5:08 PM  Result Value Ref Range Status   Specimen Description EXPECTORATED SPUTUM  Final   Special Requests NONE Reflexed from [id number]  Final   Gram Stain   Final    ABUNDANT WBC PRESENT, PREDOMINANTLY PMN MODERATE GRAM POSITIVE COCCI    Culture   Final    Consistent with normal respiratory flora. Performed at [facility], [place] [address], [place], [place] [address]    Report Status 02/06/2018 FINAL  Final      Radiology Studies: Dg Chest 2 View  Result Date: 02/05/2018 CLINICAL DATA:  Short of breath. Mid chest pain for a couple of days. EXAM: CHEST - 2 VIEW COMPARISON:  02/03/2018 and older exams. FINDINGS: Hazy airspace consolidation is noted on the right predominantly in the mid lung. There is basilar consolidation, greater on the left. These findings are stable from the previous exam and previous chest CT. No new lung abnormalities. Heart is normal in size. No convincing mediastinal or hilar masses. Small effusion seen on the prior CT are not well-defined radiographically. They are likely unchanged. No pneumothorax. IMPRESSION: 1. No significant change in lung aeration from the most recent prior exams. Bilateral airspace lung opacities consistent with multifocal pneumonia. Electronically Signed   By: Lajean Manes M.D.   On: 02/05/2018 18:33   Dg Chest Port 1 View  Result Date: 02/06/2018 CLINICAL DATA:  Shortness of breath, right chest pain EXAM: PORTABLE CHEST 1 VIEW  COMPARISON:  Chest radiographs dated 02/05/2018. CT chest dated 02/03/2018. FINDINGS: Focal/patchy right midlung opacity. Mild patchy left lower lobe opacity. Mild right basilar opacity. When correlating with prior CT,  this likely reflects multifocal pneumonia. Small left pleural effusion. No pneumothorax. Heart is top-normal in size. IMPRESSION: Suspected multifocal pneumonia, right upper lobe predominant, better evaluated on prior CT. Small left pleural effusion. Electronically Signed   By: Julian Hy M.D.   On: 02/06/2018 10:59    Scheduled Meds: . allopurinol  150 mg Oral Daily  . amiodarone  200 mg Oral Daily  . apixaban  5 mg Oral BID  . budesonide (PULMICORT) nebulizer solution  0.25 mg Nebulization BID  . colchicine  0.6 mg Oral Daily  . diltiazem  240 mg Oral Daily  . escitalopram  20 mg Oral Daily  . furosemide  80 mg Intravenous BID  . ipratropium-albuterol  3 mL Nebulization Q6H  . levothyroxine  150 mcg Oral QAC breakfast  . mouth rinse  15 mL Mouth Rinse BID  . metoprolol tartrate  12.5 mg Oral BID  . multivitamin with minerals  1 tablet Oral Daily  . nortriptyline  75 mg Oral QHS   Continuous Infusions: . azithromycin 500 mg (02/07/18 1139)  . ceFEPime (MAXIPIME) IV 1 g (02/08/18 0615)     LOS: 6 days    Time spent: 35 minutes

## 2018-02-09 LAB — BASIC METABOLIC PANEL
ANION GAP: 9 (ref 5–15)
BUN: 20 mg/dL (ref 8–23)
CO2: 27 mmol/L (ref 22–32)
Calcium: 8.4 mg/dL — ABNORMAL LOW (ref 8.9–10.3)
Chloride: 95 mmol/L — ABNORMAL LOW (ref 98–111)
Creatinine, Ser: 1.3 mg/dL — ABNORMAL HIGH (ref 0.61–1.24)
GFR calc non Af Amer: 52 mL/min — ABNORMAL LOW (ref >60)
GLUCOSE: 111 mg/dL — AB (ref 70–99)
Potassium: 3.5 mmol/L (ref 3.5–5.1)
Sodium: 131 mmol/L — ABNORMAL LOW (ref 135–145)

## 2018-02-09 MED ORDER — LACTULOSE 10 GM/15ML PO SOLN
10.0000 g | ORAL | Status: AC
  Administered 2018-02-09: 10 g via ORAL
  Filled 2018-02-09: qty 15

## 2018-02-09 MED ORDER — IPRATROPIUM-ALBUTEROL 0.5-2.5 (3) MG/3ML IN SOLN
3.0000 mL | Freq: Two times a day (BID) | RESPIRATORY_TRACT | Status: DC
  Administered 2018-02-09 – 2018-02-12 (×7): 3 mL via RESPIRATORY_TRACT
  Filled 2018-02-09 (×6): qty 3

## 2018-02-09 NOTE — Progress Notes (Signed)
Physical Therapy Treatment Patient Details Name: Wayne Booth MRN: [id number] DOB: 1941-03-17 Today's Date: 02/09/2018    History of Present Illness 77 y.o. male with morbid obesity, atrial fibrillation s/p recent ablation, OSA noncompliant with CPAP, hypothyroidism, prior PE, renal and prostate cancer s/p nephrectomy here with multilobar pneumonia and acute on chronic diastolic heart failure.    PT Comments    Pt is eager to work with therapy today, however safe mobility continues to be limited by oxygen desaturation (see General Comments), and decreased strength and endurance. Focus of session was mobility and exercise while maintaining SaO2. Pt is min A for bed mobility and sit<>stand with RW. Worked on standing balance and standing LE exercises. D/c plans remain appropriate at this time. PT will continue to follow acutely.    Follow Up Recommendations  SNF;Supervision/Assistance - 24 hour     Equipment Recommendations  Rolling walker with 5" wheels(versus rollator)    Recommendations for Other Services       Precautions / Restrictions Precautions Precautions: Fall Precaution Comments: O2 dependent; watch SpO2 Restrictions Weight Bearing Restrictions: No    Mobility  Bed Mobility Overal bed mobility: Needs Assistance Bed Mobility: Supine to Sit     Supine to sit: Min assist;HOB elevated     General bed mobility comments: min A for bringing trunk to upright   Transfers Overall transfer level: Needs assistance Equipment used: Rolling walker (2 wheeled) Transfers: Sit to/from Stand Sit to Stand: Min assist;From elevated surface         General transfer comment: min A for power up and steadying at [employer]  Ambulation/Gait             General Gait Details: did not attempt due to decreased strength and oxygen desaturation        Balance Overall balance assessment: Needs assistance   Sitting balance-Leahy Scale: Good     Standing balance support:  Bilateral upper extremity supported Standing balance-Leahy Scale: Fair Standing balance comment: able to static stand for 30 sec without support                            Cognition Arousal/Alertness: Awake/alert Behavior During Therapy: WFL for tasks assessed/performed Overall Cognitive Status: Within Functional Limits for tasks assessed                                        Exercises General Exercises - Lower Extremity Hip Flexion/Marching: AROM;Standing;10 reps Toe Raises: AROM;10 reps;Standing Mini-Sqauts: Standing;AROM;10 reps    General Comments General comments (skin integrity, edema, etc.): pt on 4L O2 via nasal cannula, in supine 96%O2 , with coming E0B SaO2 dropped to 83%O2, extensive education on need for pursed lipped breathing especially inhale through his nose, within 2 minutes SaO2 rebounded to 90%O2. With standing SaO2 dropped to 83%O2, with pursed lipped breathing SaO2 rebounded to 92%O2. Able to maintain SaO2 >90%O2 with exercise at EoB and watching O2 saturation monitor      Pertinent Vitals/Pain Pain Assessment: No/denies pain           PT Goals (current goals can now be found in the care plan section) Acute Rehab PT Goals PT Goal Formulation: With patient/family Time For Goal Achievement: 02/21/18 Potential to Achieve Goals: Good Progress towards PT goals: Progressing toward goals    Frequency    Min 3X/week  PT Plan Current plan remains appropriate       AM-PAC PT "6 Clicks" Daily Activity  Outcome Measure  Difficulty turning over in bed (including adjusting bedclothes, sheets and blankets)?: A Lot Difficulty moving from lying on back to sitting on the side of the bed? : Unable Difficulty sitting down on and standing up from a chair with arms (e.g., wheelchair, bedside commode, etc,.)?: Unable Help needed moving to and from a bed to chair (including a wheelchair)?: A Little Help needed walking in hospital  room?: A Little Help needed climbing 3-5 steps with a railing? : Total 6 Click Score: 11    End of Session Equipment Utilized During Treatment: Gait belt;Oxygen Activity Tolerance: Treatment limited secondary to medical complications (Comment)(hypoxic with mobility ) Patient left: with call bell/phone within reach;with family/visitor present;Other (comment)(seated EoB to eat dinner ) Nurse Communication: Other (comment)(hypoxic) PT Visit Diagnosis: Other abnormalities of gait and mobility (R26.89);Muscle weakness (generalized) (M62.81)     Time: 2162-4469 PT Time Calculation (min) (ACUTE ONLY): 27 min  Charges:  $Therapeutic Exercise: 8-22 mins $Therapeutic Activity: 8-22 mins                     Vlad Mayberry B. Migdalia Dk PT, DPT Acute Rehabilitation  [phone] Pager [phone]     [place] 02/09/2018, 6:41 PM

## 2018-02-09 NOTE — Progress Notes (Signed)
PROGRESS NOTE    Wayne Booth  LNL:[id number] DOB: 03-13-1941 DOA: 02/02/2018 PCP: Marin Olp, MD    Brief Narrative:  77 year old male, morbidly obese, with past medical history significant for atrial fibrillation status post recent ablation, OSA but noncompliant with CPAP, hypothyroidism, history of pulmonary embolism, hypertension, anxiety, nephrolithiasis, documented renal and prostate cancer. Patient is said to have undergone nephrectomy, and has one kidney. Patient was admitted with shortness of breath, right-sided chest pain, fever, small pericardial effusion and worsening renal function.  CT chest shows multilobar pneumonia.  Patient on broad-spectrum antibiotics cefepime, negative MRSA PCR screen.  D-dimer elevated ruled out for PE with low probability VQ scan, heparin switch to Eliquis. Also element of volume overload, cardiology following diuresing, creatinine improving. Cardiac BMP done on 02/02/2018 was 54.3, and troponin has been 0.03 on 2 occasions.   Assessment & Plan:   Active Problems:   History of renal cell carcinoma   Essential hypertension   OSA (obstructive sleep apnea)   Hypothyroidism   History of pulmonary embolism   Paroxysmal atrial fibrillation (HCC)   Acute respiratory failure with hypoxia (HCC)   HCAP (healthcare-associated pneumonia)   Pericardial effusion   SOB (shortness of breath)   Elevated troponin   Acute on chronic diastolic heart failure (HCC)   Dyspnea   Pneumonia of right middle lobe due to infectious organism [facility])  Bilateral multilobar lobe pneumonia- on CT 02/03/18.  Low probability VQ scan.  4th day on broad-spectrum antibiotics cefepime, vancomycin discontinued, negative MRSA PCR. -Urine strep and Legionella negative -Patient still hypoxic, never smoker, is on home O2 -O2 requirements improved to 2L this AM although pt remains dyspneic.   -Patient continued on Cefepime with added azithromycin for atypical coverage  Hypoxic  acute resp failure/ dyspnea- dyspnea combination of underlying COPD (on home O2) w/ new bilat PNA and also suspected diast CHF issues.  BNP 54, but patient morbidly obese - good diuresis with IV lasix -Cardiology following, transition to PO lasix today - fluid restriction 1200 cc -repeat bmet in AM  LLE erythema/ edema - doppler done, negative for DVT - outlined w/ marker, erythema better today, cont diuresis /abx as per above for possible cellulitis  Right-sided chest pain- prior history of pulmonary embolism.  V/Q low probability. Patient has history of prostate and renal cancer. - Patient recently underwent ablation by the cardiology team for atrial fibrillation. -02/05/2018: Transitioned heparin drip back to Eliquis. -continue with above abx for PNA  Acute kidney injury-initially improving with diuresis, poss cardiorenal -Cr improved to 1.3 this AM - daily bmet while diuresing  Paroxysmal atrial fibrillation - Continue Cardizem 240 mg qd and amio 200mg  as at home - holding home metoprolol 12.5 bid - recent ablation procedure w/ L groin bruising - on eliquis 5 mg bid -rate controlled  Hypertension, essential - Continue Cardizem 240 qd, metop on hold for now - BP remains stable at present, reviewed  Hypothyroidism - Continue synthroid 150 mcg daily  -Stable at present  Morbid obesity/OSA - Patient tolerating cpap  Anxiety and depression - Continue lexapro and nortriptyline as at home - having side effects/ agitation from Ambien , will dc and label as intolerance - cont xanax tid as needed - takes suvorexant at home for sleep, currently on order  DVT prophylaxis: eliquis Code Status: Full Family Communication: Pt in room, family at bedside Disposition Plan: Uncertain at this time  Consultants:   Cardiology  Procedures:   ECHO - EF 55%  Antimicrobials:  Anti-infectives (From admission, onward)   Start     Dose/Rate Route Frequency Ordered Stop    02/06/18 1100  azithromycin (ZITHROMAX) 500 mg in sodium chloride 0.9 % 250 mL IVPB     500 mg 250 mL/hr over 60 Minutes Intravenous Every 24 hours 02/06/18 1024     02/03/18 2000  vancomycin (VANCOCIN) 1,500 mg in sodium chloride 0.9 % 500 mL IVPB  Status:  Discontinued     1,500 mg 250 mL/hr over 120 Minutes Intravenous Every 24 hours 02/02/18 1948 02/03/18 1122   02/03/18 0400  ceFEPIme (MAXIPIME) 1 g in sodium chloride 0.9 % 100 mL IVPB     1 g 200 mL/hr over 30 Minutes Intravenous Every 8 hours 02/03/18 0007 02/11/18 0559   02/02/18 1845  vancomycin (VANCOCIN) 2,500 mg in sodium chloride 0.9 % 500 mL IVPB     2,500 mg 250 mL/hr over 120 Minutes Intravenous STAT 02/02/18 1832 02/02/18 2154   02/02/18 1845  ceFEPIme (MAXIPIME) 2 g in sodium chloride 0.9 % 100 mL IVPB     2 g 200 mL/hr over 30 Minutes Intravenous  Once 02/02/18 1833 02/02/18 1916   02/02/18 1815  ceFEPIme (MAXIPIME) 1 g in sodium chloride 0.9 % 100 mL IVPB  Status:  Discontinued     1 g 200 mL/hr over 30 Minutes Intravenous  Once 02/02/18 1802 02/02/18 1833       Subjective: Reports feeling better today  Objective: Vitals:   02/09/18 0744 02/09/18 0825 02/09/18 0928 02/09/18 1200  BP: 113/78  110/85 113/75  Pulse:   84 83  Resp: 19     Temp: 97.7 F (36.5 C)   98.1 F (36.7 C)  TempSrc: Oral   Oral  SpO2: 97% 96%  95%  Weight:   121.9 kg   Height:        Intake/Output Summary (Last 24 hours) at 02/09/2018 1544 Last data filed at 02/09/2018 7829 Gross per 24 hour  Intake 350 ml  Output 1600 ml  Net -1250 ml   Filed Weights   02/07/18 0252 02/08/18 0900 02/09/18 0928  Weight: 133.4 kg 122.3 kg 121.9 kg    Examination:  General exam: Appears calm and comfortable  Respiratory system: Clear to auscultation. Respiratory effort normal. Cardiovascular system: S1 & S2 heard, RRR Gastrointestinal system: Abdomen is nondistended, soft and nontender. No organomegaly or masses felt. Normal bowel sounds  heard. Central nervous system: Alert and oriented. No focal neurological deficits. Extremities: Symmetric 5 x 5 power. Skin: No rashes, lesions Psychiatry: Judgement and insight appear normal. Mood & affect appropriate.   Data Reviewed: I have personally reviewed following labs and imaging studies  CBC: Recent Labs  Lab 02/02/18 2018  02/04/18 0730 02/05/18 0423 02/06/18 0340 02/07/18 0320 02/08/18 0300  WBC 16.6*   < > 13.5* 13.0* 12.8* 14.1* 14.1*  NEUTROABS 12.4*  --  9.5*  --  8.2*  --   --   HGB 13.1   < > 12.5* 12.4* 11.8* 12.4* 12.1*  HCT 39.7   < > 38.6* 38.0* 36.1* 37.7* 37.7*  MCV 91.1   < > 92.1 90.9 90.7 90.8 90.6  PLT 307   < > 319 327 338 384 419*   < > = values in this interval not displayed.   Basic Metabolic Panel: Recent Labs  Lab 02/04/18 0730 02/05/18 0423 02/06/18 0340 02/07/18 0320 02/08/18 0954 02/09/18 0326  NA 134* 132* 132* 134* 133* 131*  K 3.9 3.8 3.2* 3.7 3.5  3.5  CL 96* 97* 94* 96* 94* 95*  CO2 27 22 28 30 28 27   GLUCOSE 101* 98 116* 111* 103* 111*  BUN 15 15 18 17 20 20   CREATININE 1.55* 1.49* 1.56* 1.48* 1.47* 1.30*  CALCIUM 8.7* 8.3* 8.3* 8.5* 8.4* 8.4*  MG 2.1  --  2.0  2.0  --   --   --   PHOS 3.4  --  3.7  --   --   --    GFR: Estimated Creatinine Clearance: 65.2 mL/min (A) (by C-G formula based on SCr of 1.3 mg/dL (H)). Liver Function Tests: Recent Labs  Lab 02/04/18 0730 02/06/18 0340  ALBUMIN 2.8* 2.5*   No results for input(s): LIPASE, AMYLASE in the last 168 hours. No results for input(s): AMMONIA in the last 168 hours. Coagulation Profile: Recent Labs  Lab 02/03/18 0434  INR 1.20   Cardiac Enzymes: Recent Labs  Lab 02/03/18 0351 02/03/18 0902 02/03/18 1720  TROPONINI 0.03* 0.03* <0.03   BNP (last 3 results) No results for input(s): PROBNP in the last 8760 hours. HbA1C: No results for input(s): HGBA1C in the last 72 hours. CBG: Recent Labs  Lab 02/02/18 1811  GLUCAP 85   Lipid Profile: No  results for input(s): CHOL, HDL, LDLCALC, TRIG, CHOLHDL, LDLDIRECT in the last 72 hours. Thyroid Function Tests: No results for input(s): TSH, T4TOTAL, FREET4, T3FREE, THYROIDAB in the last 72 hours. Anemia Panel: No results for input(s): VITAMINB12, FOLATE, FERRITIN, TIBC, IRON, RETICCTPCT in the last 72 hours. Sepsis Labs: Recent Labs  Lab 02/02/18 1821 02/03/18 0351 02/03/18 0434 02/03/18 0902  PROCALCITON  --  0.63  --   --   LATICACIDVEN 1.51  --  1.2 1.9    Recent Results (from the past 240 hour(s))  Culture, blood (routine x 2)     Status: None   Collection Time: 02/02/18  5:27 PM  Result Value Ref Range Status   Specimen Description BLOOD BLOOD LEFT FOREARM  Final   Special Requests   Final    BOTTLES DRAWN AEROBIC AND ANAEROBIC Blood Culture results may not be optimal due to an inadequate volume of blood received in culture bottles   Culture   Final    NO GROWTH 5 DAYS Performed at [facility], [place] [address] [place], [place] [address]    Report Status 02/07/2018 FINAL  Final  Culture, blood (routine x 2)     Status: None   Collection Time: 02/02/18  6:35 PM  Result Value Ref Range Status   Specimen Description BLOOD RIGHT HAND  Final   Special Requests   Final    BOTTLES DRAWN AEROBIC AND ANAEROBIC Blood Culture adequate volume   Culture   Final    NO GROWTH 5 DAYS Performed at [facility], [place] [address], [place], [place] [address]    Report Status 02/08/2018 FINAL  Final  MRSA PCR Screening     Status: None   Collection Time: 02/03/18  2:44 AM  Result Value Ref Range Status   MRSA by PCR NEGATIVE NEGATIVE Final    Comment:        The GeneXpert MRSA Assay (FDA approved for NASAL specimens only), is one component of a comprehensive MRSA colonization surveillance program. It is not intended to diagnose MRSA infection nor to guide or monitor treatment for MRSA infections. Performed at [facility], [place] [address], [place], [place]  [address]   Culture, sputum-assessment  Status: None   Collection Time: 02/03/18  5:08 PM  Result Value Ref Range Status   Specimen Description EXPECTORATED SPUTUM  Final   Special Requests NONE  Final   Sputum evaluation   Final    THIS SPECIMEN IS ACCEPTABLE FOR SPUTUM CULTURE Performed at [facility], [address], [place], [place] [address]    Report Status 02/03/2018 FINAL  Final  Culture, respiratory     Status: None   Collection Time: 02/03/18  5:08 PM  Result Value Ref Range Status   Specimen Description EXPECTORATED SPUTUM  Final   Special Requests NONE Reflexed from [id number]  Final   Gram Stain   Final    ABUNDANT WBC PRESENT, PREDOMINANTLY PMN MODERATE GRAM POSITIVE COCCI    Culture   Final    Consistent with normal respiratory flora. Performed at [facility], [place] [address], [place], [place] [address]    Report Status 02/06/2018 FINAL  Final     Radiology Studies: No results found.  Scheduled Meds: . allopurinol  150 mg Oral Daily  . amiodarone  200 mg Oral Daily  . apixaban  5 mg Oral BID  . budesonide (PULMICORT) nebulizer solution  0.25 mg Nebulization BID  . colchicine  0.6 mg Oral Daily  . diltiazem  240 mg Oral Daily  . docusate sodium  200 mg Oral QHS  . escitalopram  20 mg Oral Daily  . furosemide  80 mg Oral Daily  . ipratropium-albuterol  3 mL Nebulization BID  . levothyroxine  150 mcg Oral QAC breakfast  . mouth rinse  15 mL Mouth Rinse BID  . metoprolol tartrate  12.5 mg Oral BID  . multivitamin with minerals  1 tablet Oral Daily  . nortriptyline  75 mg Oral QHS  . Suvorexant  10 mg Oral QHS   Continuous Infusions: . azithromycin 500 mg (02/09/18 1203)  . ceFEPime (MAXIPIME) IV 1 g (02/09/18 1515)     LOS: 7 days   Marylu Lund, MD [facility] Pager [phone]  If 7PM-7AM, please contact night-coverage [email or web address] Password [facility] 02/09/2018, 3:44 PM

## 2018-02-09 NOTE — Progress Notes (Signed)
Progress Note  Patient Name: Wayne Booth Date of Encounter: 02/09/2018  Primary Cardiologist: Larae Grooms, MD   Subjective   His breathing is getting much better.  He wants to walk today.  He is feeling weak from laying in bed so long.  Inpatient Medications    Scheduled Meds: . allopurinol  150 mg Oral Daily  . amiodarone  200 mg Oral Daily  . apixaban  5 mg Oral BID  . budesonide (PULMICORT) nebulizer solution  0.25 mg Nebulization BID  . colchicine  0.6 mg Oral Daily  . diltiazem  240 mg Oral Daily  . docusate sodium  200 mg Oral QHS  . escitalopram  20 mg Oral Daily  . furosemide  80 mg Oral Daily  . ipratropium-albuterol  3 mL Nebulization Q6H  . levothyroxine  150 mcg Oral QAC breakfast  . mouth rinse  15 mL Mouth Rinse BID  . metoprolol tartrate  12.5 mg Oral BID  . multivitamin with minerals  1 tablet Oral Daily  . nortriptyline  75 mg Oral QHS  . Suvorexant  10 mg Oral QHS   Continuous Infusions: . azithromycin 500 mg (02/08/18 1230)  . ceFEPime (MAXIPIME) IV 1 g (02/09/18 5625)   PRN Meds: acetaminophen **OR** acetaminophen, ALPRAZolam, alum & mag hydroxide-simeth, nitroGLYCERIN, ondansetron **OR** ondansetron (ZOFRAN) IV, polyvinyl alcohol, traZODone   Vital Signs    Vitals:   02/09/18 0233 02/09/18 0235 02/09/18 0300 02/09/18 0744  BP:   133/79 113/78  Pulse:  83 84   Resp:  (!) 22 (!) 22 19  Temp:   97.8 F (36.6 C) 97.7 F (36.5 C)  TempSrc:   Oral Oral  SpO2: 96% 96% 96% 97%  Weight:      Height:        Intake/Output Summary (Last 24 hours) at 02/09/2018 0808 Last data filed at 02/09/2018 6389 Gross per 24 hour  Intake 350 ml  Output 1600 ml  Net -1250 ml   Filed Weights   02/06/18 0551 02/07/18 0252 02/08/18 0900  Weight: 133.4 kg 133.4 kg 122.3 kg    Telemetry    Sinus rhythm.  No events.- Personally Reviewed  ECG    02/06/2018: Sinus rhythm.  Rate 92 bpm.  Incomplete left bundle branch block.  QTC 500 ms.- Personally  Reviewed  Physical Exam   VS:  BP 113/78 (BP Location: Left Arm)   Pulse 84   Temp 97.7 F (36.5 C) (Oral)   Resp 19   Ht 5\' 11"  (1.803 m)   Wt 122.3 kg   SpO2 97%   BMI 37.60 kg/m  , BMI Body mass index is 37.6 kg/m. GENERAL:  Well-appearing.  Morbidly obese HEENT: Pupils equal round and reactive, fundi not visualized, oral mucosa unremarkable NECK:  JVD difficult to assess.  Carotid upstroke brisk and symmetric, no bruits, no thyromegaly LYMPHATICS:  No cervical adenopathy LUNGS: Diffuse expiratory wheezing. HEART:  RRR.  PMI not displaced or sustained,S1 and S2 within normal limits, no S3, no S4, no clicks, no rubs, no murmurs ABD:  Flat, positive bowel sounds normal in frequency in pitch, no bruits, no rebound, no guarding, no midline pulsatile mass, no hepatomegaly, no splenomegaly EXT:  2 plus pulses throughout, 1+ LE edema to upper tibia bilaterally, L>R, no cyanosis no clubbing SKIN:  No rashes no nodules.  Erythema of the left lower extremity to the lower tibia NEURO:  Cranial nerves II through XII grossly intact, motor grossly intact throughout PSYCH:  Cognitively  intact, oriented to person place and time   Labs    Chemistry Recent Labs  Lab 02/04/18 0730  02/06/18 0340 02/07/18 0320 02/08/18 0954 02/09/18 0326  NA 134*   < > 132* 134* 133* 131*  K 3.9   < > 3.2* 3.7 3.5 3.5  CL 96*   < > 94* 96* 94* 95*  CO2 27   < > 28 30 28 27   GLUCOSE 101*   < > 116* 111* 103* 111*  BUN 15   < > 18 17 20 20   CREATININE 1.55*   < > 1.56* 1.48* 1.47* 1.30*  CALCIUM 8.7*   < > 8.3* 8.5* 8.4* 8.4*  ALBUMIN 2.8*  --  2.5*  --   --   --   GFRNONAA 42*   < > 42* 45* 45* 52*  GFRAA 49*   < > 48* 52* 52* >60  ANIONGAP 11   < > 10 8 11 9    < > = values in this interval not displayed.     Hematology Recent Labs  Lab 02/06/18 0340 02/07/18 0320 02/08/18 0300  WBC 12.8* 14.1* 14.1*  RBC 3.98* 4.15* 4.16*  HGB 11.8* 12.4* 12.1*  HCT 36.1* 37.7* 37.7*  MCV 90.7 90.8 90.6    MCH 29.6 29.9 29.1  MCHC 32.7 32.9 32.1  RDW 14.3 14.1 14.3  PLT 338 384 419*    Cardiac Enzymes Recent Labs  Lab 02/03/18 0351 02/03/18 0902 02/03/18 1720  TROPONINI 0.03* 0.03* <0.03    Recent Labs  Lab 02/02/18 1819  TROPIPOC 0.12*     BNP Recent Labs  Lab 02/02/18 1727  BNP 54.3     DDimer  Recent Labs  Lab 02/03/18 1218  DDIMER 1.58*     Radiology    No results found.  Cardiac Studies     Patient Profile     77 y.o. male with morbid obesity, atrial fibrillation s/p recent ablation, OSA noncompliant with CPAP, hypothyroidism, prior PE, renal and prostate cancer s/p nephrectomy here with multilobar pneumonia, cellulitis, and acute on chronic diastolic heart failure.  Assessment & Plan    # Multilobar pneumonia:  # Hypoxic respiratory failure: Improving clinically.  Antibiotics per IM.  No plans for bronch per Pulmonology.  # Acute on chronic diastolic heart failure: # Hypertension: Volume status improving.  He was net -1.2L yesteray.  Switched to lasix to 80mg  oral to start today.   # AKI:   # Hyponatremia: Sodium is down to 131.  However his creatinine continues to improve with diuresis. We have switched to oral lasix today and he only received one dose of IV lasix yesterday.  He was net -1.2L.  Continue to monitor for now.    # Paroxysmal atrial fibrillation: S/p ablation.  He is maintaining sinus rhythm.  Continue amiodarone, metoprolol, diltiazem, and Eliquis.  # Dispo: PT recommended SNF/24 hour care.  He needs to walk today.     For questions or updates, please contact [place] Please consult [email or web address] for contact info under Cardiology/STEMI.      Signed, Skeet Latch, MD  02/09/2018, 8:08 AM

## 2018-02-10 ENCOUNTER — Inpatient Hospital Stay (HOSPITAL_COMMUNITY): Payer: Medicare Other

## 2018-02-10 LAB — BASIC METABOLIC PANEL
ANION GAP: 10 (ref 5–15)
BUN: 23 mg/dL (ref 8–23)
CALCIUM: 8.5 mg/dL — AB (ref 8.9–10.3)
CO2: 27 mmol/L (ref 22–32)
Chloride: 95 mmol/L — ABNORMAL LOW (ref 98–111)
Creatinine, Ser: 1.43 mg/dL — ABNORMAL HIGH (ref 0.61–1.24)
GFR, EST AFRICAN AMERICAN: 54 mL/min — AB (ref >60)
GFR, EST NON AFRICAN AMERICAN: 46 mL/min — AB (ref >60)
Glucose, Bld: 117 mg/dL — ABNORMAL HIGH (ref 70–99)
Potassium: 4 mmol/L (ref 3.5–5.1)
Sodium: 132 mmol/L — ABNORMAL LOW (ref 135–145)

## 2018-02-10 MED ORDER — LACTULOSE 10 GM/15ML PO SOLN
30.0000 g | Freq: Once | ORAL | Status: AC
  Administered 2018-02-10: 30 g via ORAL
  Filled 2018-02-10: qty 45

## 2018-02-10 MED ORDER — BISACODYL 10 MG RE SUPP
10.0000 mg | Freq: Once | RECTAL | Status: AC
  Administered 2018-02-10: 10 mg via RECTAL
  Filled 2018-02-10: qty 1

## 2018-02-10 MED ORDER — FUROSEMIDE 40 MG PO TABS
40.0000 mg | ORAL_TABLET | Freq: Every day | ORAL | Status: DC
  Administered 2018-02-10: 40 mg via ORAL
  Filled 2018-02-10: qty 1

## 2018-02-10 NOTE — Progress Notes (Addendum)
1:30pm CSW spoke with pt and pt wife they have to decided to stick with original choice for [facility] SNF for rehab  CSW requested [place] initiated [facility] auth for potential DC tomorrow or over the weekend  9:30am CSW informed pt is interested in looking at alternative SNF options- CSW provided with list of current offers  Jorge Ny, [place] Worker [phone]

## 2018-02-10 NOTE — Progress Notes (Signed)
PROGRESS NOTE    Wayne Booth  PFX:[id number] DOB: Aug 02, 1940 DOA: 02/02/2018 PCP: Marin Olp, MD    Brief Narrative:  77 year old male, morbidly obese, with past medical history significant for atrial fibrillation status post recent ablation, OSA but noncompliant with CPAP, hypothyroidism, history of pulmonary embolism, hypertension, anxiety, nephrolithiasis, documented renal and prostate cancer. Patient is said to have undergone nephrectomy, and has one kidney. Patient was admitted with shortness of breath, right-sided chest pain, fever, small pericardial effusion and worsening renal function.  CT chest shows multilobar pneumonia.  Patient on broad-spectrum antibiotics cefepime, negative MRSA PCR screen.  D-dimer elevated ruled out for PE with low probability VQ scan, heparin switch to Eliquis. Also element of volume overload, cardiology following diuresing, creatinine improving. Cardiac BMP done on 02/02/2018 was 54.3, and troponin has been 0.03 on 2 occasions.   Assessment & Plan:   Active Problems:   History of renal cell carcinoma   Essential hypertension   OSA (obstructive sleep apnea)   Hypothyroidism   History of pulmonary embolism   Paroxysmal atrial fibrillation (HCC)   Acute respiratory failure with hypoxia (HCC)   HCAP (healthcare-associated pneumonia)   Pericardial effusion   SOB (shortness of breath)   Elevated troponin   Acute on chronic diastolic heart failure (HCC)   Dyspnea   Pneumonia of right middle lobe due to infectious organism [facility])  Bilateral multilobar lobe pneumonia- on CT 02/03/18.  Low probability VQ scan.  4th day on broad-spectrum antibiotics cefepime, vancomycin discontinued, negative MRSA PCR. -Urine strep and Legionella negative -Patient still hypoxic, never smoker, is on home O2 -O2 requires noted to have improved to 2L. Still appears sob  -Patient had been continued on Cefepime with added azithromycin for atypical coverage -Abx noted since  8/7  Hypoxic acute resp failure/ dyspnea- dyspnea combination of underlying COPD (on home O2) w/ new bilat PNA and also suspected diast CHF issues.  BNP 55, but patient morbidly obese - good diuresis noted with IV lasix -Cardiology is following, currently on lasix PO - fluid restriction 1200 cc -repeat bmet reviewed. Repeat CXR reviewed. Findings of continued multi-focal PNA noted, cannot rule out edema -With rising Cr, lasix dose decreased per Cardiology  LLE erythema/ edema - doppler done, negative for DVT - outlined w/ marker, erythema better today, cont diuresis /abx as per above for possible cellulitis -stable at this time  Right-sided chest pain- prior history of pulmonary embolism.  V/Q low probability. Patient has history of prostate and renal cancer. - Patient recently underwent ablation by the cardiology team for atrial fibrillation. -02/05/2018: Transitioned off heparin drip back to Eliquis. -continue with above abx for PNA  Acute kidney injury-initially improving with diuresis, poss cardiorenal -Cr improved to 1.3, however higher today -Lasix dose decreased per Cardiology -Repeat bmet in AM  Paroxysmal atrial fibrillation - Continue Cardizem 240 mg qd and amio 200mg  as at home - holding home metoprolol 12.5 bid - recent ablation procedure w/ L groin bruising - on eliquis 5 mg bid - Presently remains rate controlled  Hypertension, essential - Continue Cardizem 240 qd, metop on hold for now - BP remains stable at this time, vital signs reviewed  Hypothyroidism - Continue synthroid 150 mcg daily as tolerated - Patient remains stable  Morbid obesity/OSA - Patient tolerating cpap - Continue as tolerated  Anxiety and depression - Continue lexapro and nortriptyline as at home - having side effects/ agitation from Ambien , will dc and label as intolerance - cont xanax tid  as needed - takes suvorexant at home for sleep, had been ordered, will  continue  Constipation -No bowel movement noted in the past 4-5 days -No result with lactulose -Will give trial of lactulose with dulcolax suppository  DVT prophylaxis: eliquis Code Status: Full Family Communication: Pt in room, family at bedside Disposition Plan: Uncertain at this time  Consultants:   Cardiology  Procedures:   ECHO - EF 55%  Antimicrobials: Anti-infectives (From admission, onward)   Start     Dose/Rate Route Frequency Ordered Stop   02/06/18 1100  azithromycin (ZITHROMAX) 500 mg in sodium chloride 0.9 % 250 mL IVPB     500 mg 250 mL/hr over 60 Minutes Intravenous Every 24 hours 02/06/18 1024     02/03/18 2000  vancomycin (VANCOCIN) 1,500 mg in sodium chloride 0.9 % 500 mL IVPB  Status:  Discontinued     1,500 mg 250 mL/hr over 120 Minutes Intravenous Every 24 hours 02/02/18 1948 02/03/18 1122   02/03/18 0400  ceFEPIme (MAXIPIME) 1 g in sodium chloride 0.9 % 100 mL IVPB     1 g 200 mL/hr over 30 Minutes Intravenous Every 8 hours 02/03/18 0007 02/11/18 0559   02/02/18 1845  vancomycin (VANCOCIN) 2,500 mg in sodium chloride 0.9 % 500 mL IVPB     2,500 mg 250 mL/hr over 120 Minutes Intravenous STAT 02/02/18 1832 02/02/18 2154   02/02/18 1845  ceFEPIme (MAXIPIME) 2 g in sodium chloride 0.9 % 100 mL IVPB     2 g 200 mL/hr over 30 Minutes Intravenous  Once 02/02/18 1833 02/02/18 1916   02/02/18 1815  ceFEPIme (MAXIPIME) 1 g in sodium chloride 0.9 % 100 mL IVPB  Status:  Discontinued     1 g 200 mL/hr over 30 Minutes Intravenous  Once 02/02/18 1802 02/02/18 1833      Subjective: Complains of continued constipation  Objective: Vitals:   02/10/18 0750 02/10/18 0831 02/10/18 0835 02/10/18 1220  BP: 121/81   (!) 135/95  Pulse: 82 86  82  Resp: 18 20  18   Temp: 98.1 F (36.7 C)   98.1 F (36.7 C)  TempSrc: Oral   Oral  SpO2: 93% 99% 97% 97%  Weight:      Height:        Intake/Output Summary (Last 24 hours) at 02/10/2018 1449 Last data filed at  02/10/2018 1247 Gross per 24 hour  Intake 340 ml  Output -  Net 340 ml   Filed Weights   02/08/18 0900 02/09/18 0928 02/10/18 0400  Weight: 122.3 kg 121.9 kg 131.1 kg    Examination: General exam: Awake, laying in bed, in nad Respiratory system: Normal respiratory effort, no wheezing Cardiovascular system: regular rate, s1, s2 Gastrointestinal system: pos BS, obese Central nervous system: CN2-12 grossly intact, strength intact Extremities: Perfused, no clubbing Skin: Normal skin turgor, no notable skin lesions seen Psychiatry: Mood normal // no visual hallucinations   Data Reviewed: I have personally reviewed following labs and imaging studies  CBC: Recent Labs  Lab 02/04/18 0730 02/05/18 0423 02/06/18 0340 02/07/18 0320 02/08/18 0300  WBC 13.5* 13.0* 12.8* 14.1* 14.1*  NEUTROABS 9.5*  --  8.2*  --   --   HGB 12.5* 12.4* 11.8* 12.4* 12.1*  HCT 38.6* 38.0* 36.1* 37.7* 37.7*  MCV 92.1 90.9 90.7 90.8 90.6  PLT 319 327 338 384 314*   Basic Metabolic Panel: Recent Labs  Lab 02/04/18 0730  02/06/18 0340 02/07/18 0320 02/08/18 0954 02/09/18 0326 02/10/18 0252  NA  134*   < > 132* 134* 133* 131* 132*  K 3.9   < > 3.2* 3.7 3.5 3.5 4.0  CL 96*   < > 94* 96* 94* 95* 95*  CO2 27   < > 28 30 28 27 27   GLUCOSE 101*   < > 116* 111* 103* 111* 117*  BUN 15   < > 18 17 20 20 23   CREATININE 1.55*   < > 1.56* 1.48* 1.47* 1.30* 1.43*  CALCIUM 8.7*   < > 8.3* 8.5* 8.4* 8.4* 8.5*  MG 2.1  --  2.0  2.0  --   --   --   --   PHOS 3.4  --  3.7  --   --   --   --    < > = values in this interval not displayed.   GFR: Estimated Creatinine Clearance: 61.6 mL/min (A) (by C-G formula based on SCr of 1.43 mg/dL (H)). Liver Function Tests: Recent Labs  Lab 02/04/18 0730 02/06/18 0340  ALBUMIN 2.8* 2.5*   No results for input(s): LIPASE, AMYLASE in the last 168 hours. No results for input(s): AMMONIA in the last 168 hours. Coagulation Profile: No results for input(s): INR, PROTIME  in the last 168 hours. Cardiac Enzymes: Recent Labs  Lab 02/03/18 1720  TROPONINI <0.03   BNP (last 3 results) No results for input(s): PROBNP in the last 8760 hours. HbA1C: No results for input(s): HGBA1C in the last 72 hours. CBG: No results for input(s): GLUCAP in the last 168 hours. Lipid Profile: No results for input(s): CHOL, HDL, LDLCALC, TRIG, CHOLHDL, LDLDIRECT in the last 72 hours. Thyroid Function Tests: No results for input(s): TSH, T4TOTAL, FREET4, T3FREE, THYROIDAB in the last 72 hours. Anemia Panel: No results for input(s): VITAMINB12, FOLATE, FERRITIN, TIBC, IRON, RETICCTPCT in the last 72 hours. Sepsis Labs: No results for input(s): PROCALCITON, LATICACIDVEN in the last 168 hours.  Recent Results (from the past 240 hour(s))  Culture, blood (routine x 2)     Status: None   Collection Time: 02/02/18  5:27 PM  Result Value Ref Range Status   Specimen Description BLOOD BLOOD LEFT FOREARM  Final   Special Requests   Final    BOTTLES DRAWN AEROBIC AND ANAEROBIC Blood Culture results may not be optimal due to an inadequate volume of blood received in culture bottles   Culture   Final    NO GROWTH 5 DAYS Performed at [facility], [place] [address], [place], [place] [address]    Report Status 02/07/2018 FINAL  Final  Culture, blood (routine x 2)     Status: None   Collection Time: 02/02/18  6:35 PM  Result Value Ref Range Status   Specimen Description BLOOD RIGHT HAND  Final   Special Requests   Final    BOTTLES DRAWN AEROBIC AND ANAEROBIC Blood Culture adequate volume   Culture   Final    NO GROWTH 5 DAYS Performed at [facility], [place] [address] [place],  [address]    Report Status 02/08/2018 FINAL  Final  MRSA PCR Screening     Status: None   Collection Time: 02/03/18  2:44 AM  Result Value Ref Range Status   MRSA by PCR NEGATIVE NEGATIVE Final    Comment:        The GeneXpert MRSA Assay (FDA approved for NASAL specimens only), is one  component of a comprehensive MRSA colonization surveillance program. It is not intended to diagnose  MRSA infection nor to guide or monitor treatment for MRSA infections. Performed at [facility], [place] [address] [place], [place] [address]   Culture, sputum-assessment     Status: None   Collection Time: 02/03/18  5:08 PM  Result Value Ref Range Status   Specimen Description EXPECTORATED SPUTUM  Final   Special Requests NONE  Final   Sputum evaluation   Final    THIS SPECIMEN IS ACCEPTABLE FOR SPUTUM CULTURE Performed at [facility], [address] [place], [place] [address]    Report Status 02/03/2018 FINAL  Final  Culture, respiratory     Status: None   Collection Time: 02/03/18  5:08 PM  Result Value Ref Range Status   Specimen Description EXPECTORATED SPUTUM  Final   Special Requests NONE Reflexed from [id number]  Final   Gram Stain   Final    ABUNDANT WBC PRESENT, PREDOMINANTLY PMN MODERATE GRAM POSITIVE COCCI    Culture   Final    Consistent with normal respiratory flora. Performed at [facility], [place] [address] [place], [place] [address]    Report Status 02/06/2018 FINAL  Final     Radiology Studies: Dg Chest Port 1 View  Result Date: 02/10/2018 CLINICAL DATA:  Shortness of breath. EXAM: PORTABLE CHEST 1 VIEW COMPARISON:  02/06/2018. FINDINGS: Cardiomegaly. Infiltrates noted in the right upper lung and both lung bases. Findings consistent with multilobar pneumonia. No significant interim improvement from prior exam. No pleural effusion or pneumothorax. Scratch small pleural effusions again noted. No pneumothorax. Stable cardiomegaly. Small metallic density again noted the left chest. No acute bony abnormality identified. Degenerative changes thoracic spine. IMPRESSION: 1. Persistent unchanged multifocal bilateral pulmonary infiltrates consistent with multifocal pneumonia. Pulmonary edema cannot be completely excluded. Small bilateral pleural effusions  again noted. No interim change. 2.  Stable cardiomegaly. Electronically Signed   By: [place]   On: 02/10/2018 10:12    Scheduled Meds: . allopurinol  150 mg Oral Daily  . amiodarone  200 mg Oral Daily  . apixaban  5 mg Oral BID  . bisacodyl  10 mg Rectal Once  . budesonide (PULMICORT) nebulizer solution  0.25 mg Nebulization BID  . colchicine  0.6 mg Oral Daily  . diltiazem  240 mg Oral Daily  . docusate sodium  200 mg Oral QHS  . escitalopram  20 mg Oral Daily  . furosemide  40 mg Oral Daily  . ipratropium-albuterol  3 mL Nebulization BID  . lactulose  30 g Oral Once  . levothyroxine  150 mcg Oral QAC breakfast  . mouth rinse  15 mL Mouth Rinse BID  . metoprolol tartrate  12.5 mg Oral BID  . multivitamin with minerals  1 tablet Oral Daily  . nortriptyline  75 mg Oral QHS  . Suvorexant  10 mg Oral QHS   Continuous Infusions: . azithromycin 500 mg (02/10/18 1247)  . ceFEPime (MAXIPIME) IV 1 g (02/10/18 1412)     LOS: 8 days   Marylu Lund, MD [facility] Pager [phone]  If 7PM-7AM, please contact night-coverage [email or web address] Password [facility] 02/10/2018, 2:49 PM

## 2018-02-10 NOTE — Progress Notes (Signed)
Progress Note  Patient Name: Wayne Booth Date of Encounter: 02/10/2018  Primary Cardiologist: Larae Grooms, MD   Subjective   Overall he is improving.  He had an episode of orthopnea overnight.  Inpatient Medications    Scheduled Meds: . allopurinol  150 mg Oral Daily  . amiodarone  200 mg Oral Daily  . apixaban  5 mg Oral BID  . budesonide (PULMICORT) nebulizer solution  0.25 mg Nebulization BID  . colchicine  0.6 mg Oral Daily  . diltiazem  240 mg Oral Daily  . docusate sodium  200 mg Oral QHS  . escitalopram  20 mg Oral Daily  . furosemide  40 mg Oral Daily  . ipratropium-albuterol  3 mL Nebulization BID  . levothyroxine  150 mcg Oral QAC breakfast  . mouth rinse  15 mL Mouth Rinse BID  . metoprolol tartrate  12.5 mg Oral BID  . multivitamin with minerals  1 tablet Oral Daily  . nortriptyline  75 mg Oral QHS  . Suvorexant  10 mg Oral QHS   Continuous Infusions: . azithromycin 500 mg (02/09/18 1203)  . ceFEPime (MAXIPIME) IV 1 g (02/10/18 0515)   PRN Meds: acetaminophen **OR** acetaminophen, ALPRAZolam, alum & mag hydroxide-simeth, nitroGLYCERIN, ondansetron **OR** ondansetron (ZOFRAN) IV, polyvinyl alcohol, traZODone   Vital Signs    Vitals:   02/09/18 2332 02/10/18 0347 02/10/18 0400 02/10/18 0750  BP: 123/74 120/76  121/81  Pulse: 70 84  82  Resp:    18  Temp: 99 F (37.2 C) 97.8 F (36.6 C)  98.1 F (36.7 C)  TempSrc: Oral Oral  Oral  SpO2: 94% 94%  93%  Weight:   131.1 kg   Height:        Intake/Output Summary (Last 24 hours) at 02/10/2018 0830 Last data filed at 02/09/2018 2003 Gross per 24 hour  Intake 240 ml  Output -  Net 240 ml   Filed Weights   02/08/18 0900 02/09/18 0928 02/10/18 0400  Weight: 122.3 kg 121.9 kg 131.1 kg    Telemetry    Sinus rhythm.  No events.- Personally Reviewed  ECG    02/06/2018: Sinus rhythm.  Rate 92 bpm.  Incomplete left bundle branch block.  QTC 500 ms.- Personally Reviewed  Physical Exam    VS:  BP 121/81 (BP Location: Right Arm)   Pulse 82   Temp 98.1 F (36.7 C) (Oral)   Resp 18   Ht 5\' 11"  (1.803 m)   Wt 131.1 kg   SpO2 93%   BMI 40.31 kg/m  , BMI Body mass index is 40.31 kg/m. GENERAL:  Well-appearing.  Morbidly obese HEENT: Pupils equal round and reactive, fundi not visualized, oral mucosa unremarkable NECK:  JVD difficult to assess.  Carotid upstroke brisk and symmetric, no bruits, no thyromegaly LYMPHATICS:  No cervical adenopathy LUNGS: Crackles at base HEART:  RRR.  PMI not displaced or sustained,S1 and S2 within normal limits, no S3, no S4, no clicks, no rubs, no murmurs ABD:  Flat, positive bowel sounds normal in frequency in pitch, no bruits, no rebound, no guarding, no midline pulsatile mass, no hepatomegaly, no splenomegaly EXT:  2 plus pulses throughout, 1+ LE edema to upper tibia bilaterally, L>R, no cyanosis no clubbing SKIN:  No rashes no nodules.  Erythema of the left lower extremity to the lower tibia NEURO:  Cranial nerves II through XII grossly intact, motor grossly intact throughout PSYCH:  Cognitively intact, oriented to person place and time   Labs  Chemistry Recent Labs  Lab 02/04/18 0730  02/06/18 0340  02/08/18 0954 02/09/18 0326 02/10/18 0252  NA 134*   < > 132*   < > 133* 131* 132*  K 3.9   < > 3.2*   < > 3.5 3.5 4.0  CL 96*   < > 94*   < > 94* 95* 95*  CO2 27   < > 28   < > 28 27 27   GLUCOSE 101*   < > 116*   < > 103* 111* 117*  BUN 15   < > 18   < > 20 20 23   CREATININE 1.55*   < > 1.56*   < > 1.47* 1.30* 1.43*  CALCIUM 8.7*   < > 8.3*   < > 8.4* 8.4* 8.5*  ALBUMIN 2.8*  --  2.5*  --   --   --   --   GFRNONAA 42*   < > 42*   < > 45* 52* 46*  GFRAA 49*   < > 48*   < > 52* >60 54*  ANIONGAP 11   < > 10   < > 11 9 10    < > = values in this interval not displayed.     Hematology Recent Labs  Lab 02/06/18 0340 02/07/18 0320 02/08/18 0300  WBC 12.8* 14.1* 14.1*  RBC 3.98* 4.15* 4.16*  HGB 11.8* 12.4* 12.1*  HCT  36.1* 37.7* 37.7*  MCV 90.7 90.8 90.6  MCH 29.6 29.9 29.1  MCHC 32.7 32.9 32.1  RDW 14.3 14.1 14.3  PLT 338 384 419*    Cardiac Enzymes Recent Labs  Lab 02/03/18 0902 02/03/18 1720  TROPONINI 0.03* <0.03    No results for input(s): TROPIPOC in the last 168 hours.   BNP No results for input(s): BNP, PROBNP in the last 168 hours.   DDimer  Recent Labs  Lab 02/03/18 1218  DDIMER 1.58*     Radiology    No results found.  Cardiac Studies     Patient Profile     77 y.o. male with morbid obesity, atrial fibrillation s/p recent ablation, OSA noncompliant with CPAP, hypothyroidism, prior PE, renal and prostate cancer s/p nephrectomy here with multilobar pneumonia, cellulitis, and acute on chronic diastolic heart failure.  Assessment & Plan    # Multilobar pneumonia:  # Hypoxic respiratory failure: Improving clinically.  Antibiotics per IM.  No plans for bronch per Pulmonology.  # Acute on chronic diastolic heart failure: # Hypertension: Volume status improving.  I/O not recorded yesterday.  He is net -6L since admission.  Switched to lasix to 80mg  oral 8/14 and his creatinine is rising.  Weights are inaccurate and report 10 kg weight gain overnight. We will reduce lasix to 40mg  po and check a chest xray.   # AKI:   # Hyponatremia: Sodium improved to 132 today.  Volume management as above.   # Paroxysmal atrial fibrillation: S/p ablation.  He is maintaining sinus rhythm.  Continue amiodarone, metoprolol, diltiazem, and Eliquis.       For questions or updates, please contact [place] Please consult [email or web address] for contact info under Cardiology/STEMI.      Signed, Skeet Latch, MD  02/10/2018, 8:30 AM

## 2018-02-11 LAB — BASIC METABOLIC PANEL
ANION GAP: 7 (ref 5–15)
BUN: 24 mg/dL — ABNORMAL HIGH (ref 8–23)
CALCIUM: 8.6 mg/dL — AB (ref 8.9–10.3)
CHLORIDE: 96 mmol/L — AB (ref 98–111)
CO2: 28 mmol/L (ref 22–32)
Creatinine, Ser: 1.47 mg/dL — ABNORMAL HIGH (ref 0.61–1.24)
GFR calc Af Amer: 52 mL/min — ABNORMAL LOW (ref >60)
GFR calc non Af Amer: 45 mL/min — ABNORMAL LOW (ref >60)
GLUCOSE: 121 mg/dL — AB (ref 70–99)
Potassium: 4.5 mmol/L (ref 3.5–5.1)
Sodium: 131 mmol/L — ABNORMAL LOW (ref 135–145)

## 2018-02-11 LAB — BRAIN NATRIURETIC PEPTIDE: B Natriuretic Peptide: 64.4 pg/mL (ref 0.0–100.0)

## 2018-02-11 MED ORDER — BACITRACIN-POLYMYXIN B 500-10000 UNIT/GM OP OINT
TOPICAL_OINTMENT | Freq: Every day | OPHTHALMIC | Status: DC
  Administered 2018-02-11: 23:00:00 via OPHTHALMIC
  Administered 2018-02-12 – 2018-02-13 (×2): 1 via OPHTHALMIC
  Administered 2018-02-14 – 2018-02-16 (×3): via OPHTHALMIC
  Administered 2018-02-17: 1 via OPHTHALMIC
  Administered 2018-02-18 – 2018-02-21 (×4): via OPHTHALMIC
  Administered 2018-02-22: 1 via OPHTHALMIC
  Administered 2018-02-23 – 2018-02-24 (×2): via OPHTHALMIC
  Administered 2018-02-25: 1 via OPHTHALMIC
  Filled 2018-02-11 (×2): qty 3.5

## 2018-02-11 NOTE — Consult Note (Addendum)
   [facility] CM Inpatient Consult   02/11/2018  NHAT HEARNE 10-Dec-1940 [id number]    Went to bedside to discuss [facility] services with Mr. Thoma. He states that he remembers the program but does not think he needs it. He pleasantly declines [facility] Care Management program at this time. Left [facility] Care Management brochure with contact information at bedside to call should he change his mind.   Will make inpatient RNCM aware [facility] Management services were declined.    Marthenia Rolling, MSN-Ed, RN,BSN [facility] Liaison [phone]

## 2018-02-11 NOTE — Progress Notes (Signed)
PROGRESS NOTE    Wayne Booth  MVH:[id number] DOB: [birth year] DOA: 02/02/2018 PCP: Marin Olp, MD    Brief Narrative:  77 year old male, morbidly obese, with past medical history significant for atrial fibrillation status post recent ablation, OSA but noncompliant with CPAP, hypothyroidism, history of pulmonary embolism, hypertension, anxiety, nephrolithiasis, documented renal and prostate cancer. Patient is said to have undergone nephrectomy, and has one kidney. Patient was admitted with shortness of breath, right-sided chest pain, fever, small pericardial effusion and worsening renal function.  CT chest shows multilobar pneumonia.  Patient on broad-spectrum antibiotics cefepime, negative MRSA PCR screen.  D-dimer elevated ruled out for PE with low probability VQ scan, heparin switch to Eliquis. Also element of volume overload, cardiology following diuresing, creatinine improving. Cardiac BMP done on 02/02/2018 was 54.3, and troponin has been 0.03 on 2 occasions.   Assessment & Plan:   Active Problems:   History of renal cell carcinoma   Essential hypertension   OSA (obstructive sleep apnea)   Hypothyroidism   History of pulmonary embolism   Paroxysmal atrial fibrillation (HCC)   Acute respiratory failure with hypoxia (HCC)   HCAP (healthcare-associated pneumonia)   Pericardial effusion   SOB (shortness of breath)   Elevated troponin   Acute on chronic diastolic heart failure (HCC)   Dyspnea   Pneumonia of right middle lobe due to infectious organism [facility])  Bilateral multilobar lobe pneumonia- on CT 02/03/18.  Low probability VQ scan.  4th day on broad-spectrum antibiotics cefepime, vancomycin discontinued, negative MRSA PCR. -Urine strep and Legionella negative -Patient still hypoxic, never smoker, is on home O2 -O2 requires noted to have improved to 2L. Still appears sob  -Patient had been continued on Cefepime with added azithromycin for atypical coverage -Abx noted since  8/7, will have completed 10 days of abx today  Hypoxic acute resp failure/ dyspnea- dyspnea combination of underlying COPD (on home O2) w/ new bilat PNA and also suspected diast CHF issues.  BNP 55, but patient morbidly obese - good diuresis noted with IV lasix -Cardiology is following, currently on lasix PO - fluid restriction 1200 cc -repeat bmet reviewed. Repeat CXR reviewed. Findings of continued multi-focal PNA noted, cannot rule out edema -With rising Cr, lasix now on hold. Repeat BNP reviewed, unremarkable -Will defer to Cardiology regarding timing of resuming lasix -Will repeat bmet in AM  LLE erythema/ edema - doppler done, negative for DVT - outlined w/ marker, erythema better today, cont diuresis /abx as per above for possible cellulitis -remains stable at present  Right-sided chest pain- prior history of pulmonary embolism.  V/Q low probability. Patient has history of prostate and renal cancer. - Patient recently underwent ablation by the cardiology team for atrial fibrillation. -02/05/2018: Transitioned off heparin drip back to Eliquis. -Pt completed 10 days of abx  Acute kidney injury-initially improving with diuresis, poss cardiorenal -Cr has risen to 1.47 with lasix on hold -Lasix currently on hold per Cardiology -Will check bmet in AM  Paroxysmal atrial fibrillation - Continue Cardizem 240 mg qd and amio 200mg  as at home - holding home metoprolol 12.5 bid - recent ablation procedure w/ L groin bruising - on eliquis 5 mg bid - currently rate controlled  Hypertension, essential - Continue Cardizem 240 qd, metop on hold for now - BP remains stable at this time. Continue to monitor bp and adjust as needed  Hypothyroidism - Continue synthroid 150 mcg daily as tolerated - Patient remains stable at this time  Morbid obesity/OSA - Patient  tolerating cpap - Continue with CPAP as tolerated  Anxiety and depression - Continue lexapro and nortriptyline as at  home - having side effects/ agitation from Ambien , will dc and label as intolerance - cont xanax tid as pt tolerates - takes suvorexant at home for sleep, has since been ordered  Constipation -Good results with dulcolax suppository and lactulose PO  L Eye irritation -Patient with L eye irritation, little/no relief with saline -Will prescribe antibiotic drops  DVT prophylaxis: eliquis Code Status: Full Family Communication: Pt in room, family at bedside Disposition Plan: Uncertain at this time  Consultants:   Cardiology  Procedures:   ECHO - EF 55%  Antimicrobials: Anti-infectives (From admission, onward)   Start     Dose/Rate Route Frequency Ordered Stop   02/06/18 1100  azithromycin (ZITHROMAX) 500 mg in sodium chloride 0.9 % 250 mL IVPB     500 mg 250 mL/hr over 60 Minutes Intravenous Every 24 hours 02/06/18 1024     02/03/18 2000  vancomycin (VANCOCIN) 1,500 mg in sodium chloride 0.9 % 500 mL IVPB  Status:  Discontinued     1,500 mg 250 mL/hr over 120 Minutes Intravenous Every 24 hours 02/02/18 1948 02/03/18 1122   02/03/18 0400  ceFEPIme (MAXIPIME) 1 g in sodium chloride 0.9 % 100 mL IVPB     1 g 200 mL/hr over 30 Minutes Intravenous Every 8 hours 02/03/18 0007 02/10/18 2254   02/02/18 1845  vancomycin (VANCOCIN) 2,500 mg in sodium chloride 0.9 % 500 mL IVPB     2,500 mg 250 mL/hr over 120 Minutes Intravenous STAT 02/02/18 1832 02/02/18 2154   02/02/18 1845  ceFEPIme (MAXIPIME) 2 g in sodium chloride 0.9 % 100 mL IVPB     2 g 200 mL/hr over 30 Minutes Intravenous  Once 02/02/18 1833 02/02/18 1916   02/02/18 1815  ceFEPIme (MAXIPIME) 1 g in sodium chloride 0.9 % 100 mL IVPB  Status:  Discontinued     1 g 200 mL/hr over 30 Minutes Intravenous  Once 02/02/18 1802 02/02/18 1833      Subjective: Good results with lactulose and dulcolax suppository overnight  Objective: Vitals:   02/11/18 0346 02/11/18 0700 02/11/18 0829 02/11/18 1251  BP: 126/88  120/73 105/69    Pulse: 86  85 79  Resp: 20  (!) 26 (!) 38  Temp: 97.9 F (36.6 C)  98 F (36.7 C) (!) 97.5 F (36.4 C)  TempSrc: Oral  Oral Oral  SpO2: 95%  97% 92%  Weight:  124.9 kg    Height:        Intake/Output Summary (Last 24 hours) at 02/11/2018 1633 Last data filed at 02/11/2018 1400 Gross per 24 hour  Intake 850 ml  Output 1150 ml  Net -300 ml   Filed Weights   02/09/18 0928 02/10/18 0400 02/11/18 0700  Weight: 121.9 kg 131.1 kg 124.9 kg    Examination: General exam: Conversant, in no acute distress Respiratory system: normal chest rise, clear, no audible wheezing Cardiovascular system: regular rhythm, s1-s2 Gastrointestinal system: Nondistended, nontender, pos BS Central nervous system: No seizures, no tremors Extremities: No cyanosis, no joint deformities Skin: No rashes, no pallor Psychiatry: Affect normal // no auditory hallucinations   Data Reviewed: I have personally reviewed following labs and imaging studies  CBC: Recent Labs  Lab 02/05/18 0423 02/06/18 0340 02/07/18 0320 02/08/18 0300  WBC 13.0* 12.8* 14.1* 14.1*  NEUTROABS  --  8.2*  --   --   HGB 12.4* 11.8*  12.4* 12.1*  HCT 38.0* 36.1* 37.7* 37.7*  MCV 90.9 90.7 90.8 90.6  PLT 327 338 384 462*   Basic Metabolic Panel: Recent Labs  Lab 02/06/18 0340 02/07/18 0320 02/08/18 0954 02/09/18 0326 02/10/18 0252 02/11/18 0326  NA 132* 134* 133* 131* 132* 131*  K 3.2* 3.7 3.5 3.5 4.0 4.5  CL 94* 96* 94* 95* 95* 96*  CO2 28 30 28 27 27 28   GLUCOSE 116* 111* 103* 111* 117* 121*  BUN 18 17 20 20 23  24*  CREATININE 1.56* 1.48* 1.47* 1.30* 1.43* 1.47*  CALCIUM 8.3* 8.5* 8.4* 8.4* 8.5* 8.6*  MG 2.0  2.0  --   --   --   --   --   PHOS 3.7  --   --   --   --   --    GFR: Estimated Creatinine Clearance: 58.4 mL/min (A) (by C-G formula based on SCr of 1.47 mg/dL (H)). Liver Function Tests: Recent Labs  Lab 02/06/18 0340  ALBUMIN 2.5*   No results for input(s): LIPASE, AMYLASE in the last 168 hours. No  results for input(s): AMMONIA in the last 168 hours. Coagulation Profile: No results for input(s): INR, PROTIME in the last 168 hours. Cardiac Enzymes: No results for input(s): CKTOTAL, CKMB, CKMBINDEX, TROPONINI in the last 168 hours. BNP (last 3 results) No results for input(s): PROBNP in the last 8760 hours. HbA1C: No results for input(s): HGBA1C in the last 72 hours. CBG: No results for input(s): GLUCAP in the last 168 hours. Lipid Profile: No results for input(s): CHOL, HDL, LDLCALC, TRIG, CHOLHDL, LDLDIRECT in the last 72 hours. Thyroid Function Tests: No results for input(s): TSH, T4TOTAL, FREET4, T3FREE, THYROIDAB in the last 72 hours. Anemia Panel: No results for input(s): VITAMINB12, FOLATE, FERRITIN, TIBC, IRON, RETICCTPCT in the last 72 hours. Sepsis Labs: No results for input(s): PROCALCITON, LATICACIDVEN in the last 168 hours.  Recent Results (from the past 240 hour(s))  Culture, blood (routine x 2)     Status: None   Collection Time: 02/02/18  5:27 PM  Result Value Ref Range Status   Specimen Description BLOOD BLOOD LEFT FOREARM  Final   Special Requests   Final    BOTTLES DRAWN AEROBIC AND ANAEROBIC Blood Culture results may not be optimal due to an inadequate volume of blood received in culture bottles   Culture   Final    NO GROWTH 5 DAYS Performed at [facility], [place] [address] [place], [place] [address]    Report Status 02/07/2018 FINAL  Final  Culture, blood (routine x 2)     Status: None   Collection Time: 02/02/18  6:35 PM  Result Value Ref Range Status   Specimen Description BLOOD RIGHT HAND  Final   Special Requests   Final    BOTTLES DRAWN AEROBIC AND ANAEROBIC Blood Culture adequate volume   Culture   Final    NO GROWTH 5 DAYS Performed at [facility], [place] [address] [place], [place] [address]    Report Status 02/08/2018 FINAL  Final  MRSA PCR Screening     Status: None   Collection Time: 02/03/18  2:44 AM  Result Value Ref Range  Status   MRSA by PCR NEGATIVE NEGATIVE Final    Comment:        The GeneXpert MRSA Assay (FDA approved for NASAL specimens only), is one component of a comprehensive MRSA colonization surveillance program. It is not intended to diagnose MRSA infection nor  to guide or monitor treatment for MRSA infections. Performed at [facility], [place] [address], [place], [place] [address]   Culture, sputum-assessment     Status: None   Collection Time: 02/03/18  5:08 PM  Result Value Ref Range Status   Specimen Description EXPECTORATED SPUTUM  Final   Special Requests NONE  Final   Sputum evaluation   Final    THIS SPECIMEN IS ACCEPTABLE FOR SPUTUM CULTURE Performed at [facility], [address] [place], [place] [address]    Report Status 02/03/2018 FINAL  Final  Culture, respiratory     Status: None   Collection Time: 02/03/18  5:08 PM  Result Value Ref Range Status   Specimen Description EXPECTORATED SPUTUM  Final   Special Requests NONE Reflexed from [id number]  Final   Gram Stain   Final    ABUNDANT WBC PRESENT, PREDOMINANTLY PMN MODERATE GRAM POSITIVE COCCI    Culture   Final    Consistent with normal respiratory flora. Performed at [facility], [place] [address] [place], [place] [address]    Report Status 02/06/2018 FINAL  Final     Radiology Studies: Dg Chest Port 1 View  Result Date: 02/10/2018 CLINICAL DATA:  Shortness of breath. EXAM: PORTABLE CHEST 1 VIEW COMPARISON:  02/06/2018. FINDINGS: Cardiomegaly. Infiltrates noted in the right upper lung and both lung bases. Findings consistent with multilobar pneumonia. No significant interim improvement from prior exam. No pleural effusion or pneumothorax. Scratch small pleural effusions again noted. No pneumothorax. Stable cardiomegaly. Small metallic density again noted the left chest. No acute bony abnormality identified. Degenerative changes thoracic spine. IMPRESSION: 1. Persistent unchanged multifocal bilateral  pulmonary infiltrates consistent with multifocal pneumonia. Pulmonary edema cannot be completely excluded. Small bilateral pleural effusions again noted. No interim change. 2.  Stable cardiomegaly. Electronically Signed   By: Palmer   On: 02/10/2018 10:12    Scheduled Meds: . allopurinol  150 mg Oral Daily  . amiodarone  200 mg Oral Daily  . apixaban  5 mg Oral BID  . bacitracin-polymyxin b (ophth)   Left Eye QHS  . budesonide (PULMICORT) nebulizer solution  0.25 mg Nebulization BID  . colchicine  0.6 mg Oral Daily  . diltiazem  240 mg Oral Daily  . docusate sodium  200 mg Oral QHS  . escitalopram  20 mg Oral Daily  . ipratropium-albuterol  3 mL Nebulization BID  . levothyroxine  150 mcg Oral QAC breakfast  . mouth rinse  15 mL Mouth Rinse BID  . metoprolol tartrate  12.5 mg Oral BID  . multivitamin with minerals  1 tablet Oral Daily  . nortriptyline  75 mg Oral QHS  . Suvorexant  10 mg Oral QHS   Continuous Infusions: . azithromycin 500 mg (02/11/18 1133)     LOS: 9 days   Marylu Lund, MD [facility] Pager [phone]  If 7PM-7AM, please contact night-coverage [email or web address] Password [facility] 02/11/2018, 4:33 PM

## 2018-02-11 NOTE — Progress Notes (Signed)
Physical Therapy Treatment Patient Details Name: Wayne Booth MRN: [id number] DOB: Feb 22, 1941 Today's Date: 02/11/2018    History of Present Illness 77 y.o. male with morbid obesity, atrial fibrillation s/p recent ablation, OSA noncompliant with CPAP, hypothyroidism, prior PE, renal and prostate cancer s/p nephrectomy here with multilobar pneumonia and acute on chronic diastolic heart failure.    PT Comments    Pt very eager to walk with therapy today, however pt continues to be limited in safe mobility by oxygen desaturation (see General Comments) and decreased strength and endurance. Pt progressed from minA to min guard assist with sit>stand transfers and minA to ambulate 40 feet with RW and close chair follow as pt fatigues quickly. Pt has difficulty maintaining pursed lipped breathing when he becomes fatigued. PT recommended to nsg that pt stand at [facility] while medications are administered to build his ability to regulate breath and build his LE strength. D/c plans remain appropriate at this time.    Follow Up Recommendations  SNF;Supervision/Assistance - 24 hour     Equipment Recommendations  Rolling walker with 5" wheels(versus rollator)    Recommendations for Other Services       Precautions / Restrictions Precautions Precautions: Fall Precaution Comments: O2 dependent; watch SpO2 Restrictions Weight Bearing Restrictions: No    Mobility  Bed Mobility               General bed mobility comments: OOB in recliner  Transfers Overall transfer level: Needs assistance Equipment used: Rolling walker (2 wheeled) Transfers: Sit to/from Stand Sit to Stand: Min assist         General transfer comment: min A progressing to min guard  for power up and steadying at RW, vc for hand placement  Ambulation/Gait Ambulation/Gait assistance: Min assist Gait Distance (Feet): 40 Feet Assistive device: Rolling walker (2 wheeled) Gait Pattern/deviations: Step-through  pattern;Shuffle;Trunk flexed Gait velocity: slowed Gait velocity interpretation: <1.31 ft/sec, indicative of household ambulator General Gait Details: min A for steadying, pt with decreased foot clearance with progress, vc for upright posture and procimity to RW         Balance Overall balance assessment: Needs assistance   Sitting balance-Leahy Scale: Good     Standing balance support: Bilateral upper extremity supported Standing balance-Leahy Scale: Fair Standing balance comment: able to static stand for 30 sec without support                            Cognition Arousal/Alertness: Awake/alert Behavior During Therapy: WFL for tasks assessed/performed Overall Cognitive Status: Within Functional Limits for tasks assessed                                        Exercises      General Comments General comments (skin integrity, edema, etc.): pt on 2L O2 via nasal cannula on entry, with SaO2 96%O2, with ambulation SaO2 quickly dropped to 79%O2, pt sat and O2 increased to 4L and with extensive pursed lipped breathing pt able to rebound to 90%O2, pt wheeled back to room and SaO2 100%O2 on 4L, returned pt to 2L O2 and pt able to maintian >90%O2        Pertinent Vitals/Pain Pain Assessment: No/denies pain           PT Goals (current goals can now be found in the care plan section) Acute Rehab PT Goals PT  Goal Formulation: With patient/family Time For Goal Achievement: 02/21/18 Potential to Achieve Goals: Good Progress towards PT goals: Progressing toward goals    Frequency    Min 3X/week      PT Plan Current plan remains appropriate       AM-PAC PT "6 Clicks" Daily Activity  Outcome Measure  Difficulty turning over in bed (including adjusting bedclothes, sheets and blankets)?: A Lot Difficulty moving from lying on back to sitting on the side of the bed? : Unable Difficulty sitting down on and standing up from a chair with arms (e.g.,  wheelchair, bedside commode, etc,.)?: Unable Help needed moving to and from a bed to chair (including a wheelchair)?: A Little Help needed walking in hospital room?: A Little Help needed climbing 3-5 steps with a railing? : Total 6 Click Score: 11    End of Session Equipment Utilized During Treatment: Gait belt;Oxygen Activity Tolerance: Treatment limited secondary to medical complications (Comment)(hypoxic with mobility ) Patient left: with call bell/phone within reach;with family/visitor present;Other (comment);with chair alarm set;in chair Nurse Communication: Other (comment);Mobility status(hypoxic) PT Visit Diagnosis: Other abnormalities of gait and mobility (R26.89);Muscle weakness (generalized) (M62.81)     Time: 0177-9390 PT Time Calculation (min) (ACUTE ONLY): 32 min  Charges:  $Gait Training: 8-22 mins $Therapeutic Activity: 8-22 mins                     [name] B. Migdalia Dk PT, DPT Acute Rehabilitation  [phone] Pager [phone]     [place] 02/11/2018, 11:53 AM

## 2018-02-11 NOTE — Progress Notes (Signed)
Progress Note  Patient Name: Wayne Booth Date of Encounter: 02/11/2018  Primary Cardiologist: Larae Grooms, MD   Subjective   His breathing continues to be a challenge.  It is slowly improving each day.  He wants to get up and try to walk today.  Inpatient Medications    Scheduled Meds: . allopurinol  150 mg Oral Daily  . amiodarone  200 mg Oral Daily  . apixaban  5 mg Oral BID  . budesonide (PULMICORT) nebulizer solution  0.25 mg Nebulization BID  . colchicine  0.6 mg Oral Daily  . diltiazem  240 mg Oral Daily  . docusate sodium  200 mg Oral QHS  . escitalopram  20 mg Oral Daily  . furosemide  40 mg Oral Daily  . ipratropium-albuterol  3 mL Nebulization BID  . levothyroxine  150 mcg Oral QAC breakfast  . mouth rinse  15 mL Mouth Rinse BID  . metoprolol tartrate  12.5 mg Oral BID  . multivitamin with minerals  1 tablet Oral Daily  . nortriptyline  75 mg Oral QHS  . Suvorexant  10 mg Oral QHS   Continuous Infusions: . azithromycin 500 mg (02/10/18 1247)   PRN Meds: acetaminophen **OR** acetaminophen, ALPRAZolam, alum & mag hydroxide-simeth, nitroGLYCERIN, ondansetron **OR** ondansetron (ZOFRAN) IV, polyvinyl alcohol, traZODone   Vital Signs    Vitals:   02/10/18 2300 02/10/18 2310 02/11/18 0346 02/11/18 0829  BP: 120/83  126/88 120/73  Pulse: 89  86 85  Resp: 16  20 (!) 26  Temp: 98 F (36.7 C)  97.9 F (36.6 C) 98 F (36.7 C)  TempSrc: Oral  Oral Oral  SpO2: 93% 97% 95% 97%  Weight:      Height:        Intake/Output Summary (Last 24 hours) at 02/11/2018 0831 Last data filed at 02/11/2018 0349 Gross per 24 hour  Intake 100 ml  Output 400 ml  Net -300 ml   Filed Weights   02/08/18 0900 02/09/18 0928 02/10/18 0400  Weight: 122.3 kg 121.9 kg 131.1 kg    Telemetry    Sinus rhythm.  No events.- Personally Reviewed  ECG    02/06/2018:  Sinus rhythm.  Rate 92 bpm.  Incomplete left bundle branch block.  QTC 500 ms.- Personally  Reviewed  Physical Exam   VS:  BP 120/73   Pulse 85   Temp 98 F (36.7 C) (Oral)   Resp (!) 26   Ht 5\' 11"  (1.803 m)   Wt 131.1 kg   SpO2 97%   BMI 40.31 kg/m  , BMI Body mass index is 40.31 kg/m. GENERAL:  Well-appearing.  Morbidly obese HEENT: Pupils equal round and reactive, fundi not visualized, oral mucosa unremarkable NECK:  JVD difficult to assess.  Carotid upstroke brisk and symmetric, no bruits, no thyromegaly LYMPHATICS:  No cervical adenopathy LUNGS: Bilateral rhonchi HEART:  RRR.  PMI not displaced or sustained,S1 and S2 within normal limits, no S3, no S4, no clicks, no rubs, no murmurs ABD:  Flat, positive bowel sounds normal in frequency in pitch, no bruits, no rebound, no guarding, no midline pulsatile mass, no hepatomegaly, no splenomegaly EXT:  2 plus pulses throughout, 1+ LE edema to upper tibia bilaterally, L>R, no cyanosis no clubbing SKIN:  No rashes no nodules.  Erythema of the left lower extremity to the lower tibia NEURO:  Cranial nerves II through XII grossly intact, motor grossly intact throughout PSYCH:  Cognitively intact, oriented to person place and time  Labs    Chemistry Recent Labs  Lab 02/06/18 0340  02/09/18 0326 02/10/18 0252 02/11/18 0326  NA 132*   < > 131* 132* 131*  K 3.2*   < > 3.5 4.0 4.5  CL 94*   < > 95* 95* 96*  CO2 28   < > 27 27 28   GLUCOSE 116*   < > 111* 117* 121*  BUN 18   < > 20 23 24*  CREATININE 1.56*   < > 1.30* 1.43* 1.47*  CALCIUM 8.3*   < > 8.4* 8.5* 8.6*  ALBUMIN 2.5*  --   --   --   --   GFRNONAA 42*   < > 52* 46* 45*  GFRAA 48*   < > >60 54* 52*  ANIONGAP 10   < > 9 10 7    < > = values in this interval not displayed.     Hematology Recent Labs  Lab 02/06/18 0340 02/07/18 0320 02/08/18 0300  WBC 12.8* 14.1* 14.1*  RBC 3.98* 4.15* 4.16*  HGB 11.8* 12.4* 12.1*  HCT 36.1* 37.7* 37.7*  MCV 90.7 90.8 90.6  MCH 29.6 29.9 29.1  MCHC 32.7 32.9 32.1  RDW 14.3 14.1 14.3  PLT 338 384 419*    Cardiac  Enzymes No results for input(s): TROPONINI in the last 168 hours.  No results for input(s): TROPIPOC in the last 168 hours.   BNP No results for input(s): BNP, PROBNP in the last 168 hours.   DDimer  No results for input(s): DDIMER in the last 168 hours.   Radiology    Dg Chest Port 1 View  Result Date: 02/10/2018 CLINICAL DATA:  Shortness of breath. EXAM: PORTABLE CHEST 1 VIEW COMPARISON:  02/06/2018. FINDINGS: Cardiomegaly. Infiltrates noted in the right upper lung and both lung bases. Findings consistent with multilobar pneumonia. No significant interim improvement from prior exam. No pleural effusion or pneumothorax. Scratch small pleural effusions again noted. No pneumothorax. Stable cardiomegaly. Small metallic density again noted the left chest. No acute bony abnormality identified. Degenerative changes thoracic spine. IMPRESSION: 1. Persistent unchanged multifocal bilateral pulmonary infiltrates consistent with multifocal pneumonia. Pulmonary edema cannot be completely excluded. Small bilateral pleural effusions again noted. No interim change. 2.  Stable cardiomegaly. Electronically Signed   By: Marcello Moores  Register   On: 02/10/2018 10:12    Cardiac Studies     Patient Profile     77 y.o. male with morbid obesity, atrial fibrillation s/p recent ablation, OSA noncompliant with CPAP, hypothyroidism, prior PE, renal and prostate cancer s/p nephrectomy here with multilobar pneumonia, cellulitis, and acute on chronic diastolic heart failure.  Assessment & Plan    # Multilobar pneumonia:  # Hypoxic respiratory failure: Slowly improving.  Persistent bilateral pneumonia on CXR.  Antibiotics per IM.  No plans for bronch per Pulmonology.   # Acute on chronic diastolic heart failure: # Hypertension: He still has LE edema that seems to be mostly stasis. CXR with mostly pneumonia but cannot rule out superimposed pulmonary edema.  Renal function is worsening with diuresis.   I/O not recorded  yesterday.  Will order strict I/O.  He is net -6L since admission.  Switched to lasix to 80mg  oral 8/14 and his creatinine is rising.  Check BNP.  Will hold lasix until this is back.  # AKI:   # Hyponatremia:  Volume management as above.   # Paroxysmal atrial fibrillation: S/p ablation.  He is maintaining sinus rhythm.  Continue amiodarone, metoprolol, diltiazem, and Eliquis.  For questions or updates, please contact [place] Please consult [email or web address] for contact info under Cardiology/STEMI.      Signed, Skeet Latch, MD  02/11/2018, 8:31 AM

## 2018-02-12 ENCOUNTER — Inpatient Hospital Stay: Payer: Self-pay

## 2018-02-12 ENCOUNTER — Inpatient Hospital Stay (HOSPITAL_COMMUNITY): Payer: Medicare Other

## 2018-02-12 LAB — POCT I-STAT 3, ART BLOOD GAS (G3+)
ACID-BASE DEFICIT: 3 mmol/L — AB (ref 0.0–2.0)
Bicarbonate: 22.6 mmol/L (ref 20.0–28.0)
O2 SAT: 100 %
PO2 ART: 249 mmHg — AB (ref 83.0–108.0)
TCO2: 24 mmol/L (ref 22–32)
pCO2 arterial: 42.7 mmHg (ref 32.0–48.0)
pH, Arterial: 7.332 — ABNORMAL LOW (ref 7.350–7.450)

## 2018-02-12 LAB — BASIC METABOLIC PANEL
ANION GAP: 10 (ref 5–15)
BUN: 25 mg/dL — ABNORMAL HIGH (ref 8–23)
CALCIUM: 8.7 mg/dL — AB (ref 8.9–10.3)
CO2: 27 mmol/L (ref 22–32)
Chloride: 93 mmol/L — ABNORMAL LOW (ref 98–111)
Creatinine, Ser: 1.6 mg/dL — ABNORMAL HIGH (ref 0.61–1.24)
GFR calc Af Amer: 47 mL/min — ABNORMAL LOW (ref >60)
GFR calc non Af Amer: 41 mL/min — ABNORMAL LOW (ref >60)
GLUCOSE: 144 mg/dL — AB (ref 70–99)
POTASSIUM: 4.4 mmol/L (ref 3.5–5.1)
Sodium: 130 mmol/L — ABNORMAL LOW (ref 135–145)

## 2018-02-12 LAB — URINALYSIS, ROUTINE W REFLEX MICROSCOPIC
GLUCOSE, UA: 50 mg/dL — AB
Ketones, ur: NEGATIVE mg/dL
LEUKOCYTES UA: NEGATIVE
NITRITE: NEGATIVE
PROTEIN: 100 mg/dL — AB
Specific Gravity, Urine: 1.023 (ref 1.005–1.030)
pH: 5 (ref 5.0–8.0)

## 2018-02-12 LAB — GLUCOSE, CAPILLARY
GLUCOSE-CAPILLARY: 142 mg/dL — AB (ref 70–99)
Glucose-Capillary: 165 mg/dL — ABNORMAL HIGH (ref 70–99)
Glucose-Capillary: 180 mg/dL — ABNORMAL HIGH (ref 70–99)

## 2018-02-12 LAB — PSA: Prostatic Specific Antigen: <0.01 ng/mL (ref 0.00–4.00)

## 2018-02-12 LAB — PROCALCITONIN: Procalcitonin: 0.43 ng/mL

## 2018-02-12 LAB — MRSA PCR SCREENING: MRSA by PCR: POSITIVE — AB

## 2018-02-12 LAB — LACTIC ACID, PLASMA
LACTIC ACID, VENOUS: 2.9 mmol/L — AB (ref 0.5–1.9)
LACTIC ACID, VENOUS: 2.8 mmol/L — AB (ref 0.5–1.9)

## 2018-02-12 MED ORDER — PIPERACILLIN-TAZOBACTAM 3.375 G IVPB
3.3750 g | Freq: Three times a day (TID) | INTRAVENOUS | Status: DC
  Administered 2018-02-12 – 2018-02-15 (×8): 3.375 g via INTRAVENOUS
  Filled 2018-02-12 (×10): qty 50

## 2018-02-12 MED ORDER — SODIUM CHLORIDE 0.9% FLUSH
10.0000 mL | INTRAVENOUS | Status: DC | PRN
  Administered 2018-02-13: 10 mL
  Filled 2018-02-12: qty 40

## 2018-02-12 MED ORDER — PIPERACILLIN-TAZOBACTAM 3.375 G IVPB 30 MIN
3.3750 g | Freq: Once | INTRAVENOUS | Status: DC
  Filled 2018-02-12: qty 50

## 2018-02-12 MED ORDER — CHLORHEXIDINE GLUCONATE 0.12% ORAL RINSE (MEDLINE KIT)
15.0000 mL | Freq: Two times a day (BID) | OROMUCOSAL | Status: DC
  Administered 2018-02-12 – 2018-02-22 (×19): 15 mL via OROMUCOSAL

## 2018-02-12 MED ORDER — TRAZODONE HCL 50 MG PO TABS
50.0000 mg | ORAL_TABLET | Freq: Every evening | ORAL | Status: DC | PRN
  Administered 2018-02-12: 50 mg
  Filled 2018-02-12 (×2): qty 1

## 2018-02-12 MED ORDER — ALLOPURINOL 300 MG PO TABS
150.0000 mg | ORAL_TABLET | Freq: Every day | ORAL | Status: DC
  Administered 2018-02-13: 150 mg
  Filled 2018-02-12: qty 0.5

## 2018-02-12 MED ORDER — SODIUM CHLORIDE 0.9% FLUSH
10.0000 mL | Freq: Two times a day (BID) | INTRAVENOUS | Status: DC
  Administered 2018-02-13 – 2018-02-20 (×12): 10 mL
  Administered 2018-02-21: 20 mL
  Administered 2018-02-21: 10 mL
  Administered 2018-02-22 (×2): 20 mL
  Administered 2018-02-23 – 2018-02-25 (×5): 10 mL

## 2018-02-12 MED ORDER — ORAL CARE MOUTH RINSE
15.0000 mL | OROMUCOSAL | Status: DC
  Administered 2018-02-13 – 2018-02-22 (×91): 15 mL via OROMUCOSAL

## 2018-02-12 MED ORDER — IPRATROPIUM-ALBUTEROL 0.5-2.5 (3) MG/3ML IN SOLN
3.0000 mL | Freq: Four times a day (QID) | RESPIRATORY_TRACT | Status: DC | PRN
  Administered 2018-02-12: 3 mL via RESPIRATORY_TRACT

## 2018-02-12 MED ORDER — DILTIAZEM 12 MG/ML ORAL SUSPENSION
60.0000 mg | Freq: Four times a day (QID) | ORAL | Status: DC
  Filled 2018-02-12: qty 6

## 2018-02-12 MED ORDER — ROCURONIUM BROMIDE 50 MG/5ML IV SOLN
60.0000 mg | Freq: Once | INTRAVENOUS | Status: DC
  Filled 2018-02-12: qty 6

## 2018-02-12 MED ORDER — VANCOMYCIN HCL 10 G IV SOLR: 1500.0000 mg | INTRAVENOUS | Status: DC

## 2018-02-12 MED ORDER — ADULT MULTIVITAMIN W/MINERALS CH
1.0000 | ORAL_TABLET | Freq: Every day | ORAL | Status: DC
  Administered 2018-02-13 – 2018-02-22 (×10): 1
  Filled 2018-02-12 (×11): qty 1

## 2018-02-12 MED ORDER — NORTRIPTYLINE HCL 25 MG PO CAPS
75.0000 mg | ORAL_CAPSULE | Freq: Every day | ORAL | Status: DC
  Administered 2018-02-12 – 2018-02-22 (×10): 75 mg
  Filled 2018-02-12 (×11): qty 3

## 2018-02-12 MED ORDER — METOPROLOL TARTRATE 12.5 MG HALF TABLET
12.5000 mg | ORAL_TABLET | Freq: Two times a day (BID) | ORAL | Status: DC
  Administered 2018-02-12: 12.5 mg
  Filled 2018-02-12: qty 1

## 2018-02-12 MED ORDER — IPRATROPIUM-ALBUTEROL 0.5-2.5 (3) MG/3ML IN SOLN
3.0000 mL | RESPIRATORY_TRACT | Status: DC | PRN
  Administered 2018-02-12: 3 mL via RESPIRATORY_TRACT
  Filled 2018-02-12 (×2): qty 3

## 2018-02-12 MED ORDER — METHYLPREDNISOLONE SODIUM SUCC 125 MG IJ SOLR
INTRAMUSCULAR | Status: AC
  Filled 2018-02-12: qty 2

## 2018-02-12 MED ORDER — FAMOTIDINE IN NACL 20-0.9 MG/50ML-% IV SOLN
20.0000 mg | Freq: Every day | INTRAVENOUS | Status: DC
  Administered 2018-02-12 – 2018-02-17 (×6): 20 mg via INTRAVENOUS
  Filled 2018-02-12 (×7): qty 50

## 2018-02-12 MED ORDER — IPRATROPIUM BROMIDE 0.02 % IN SOLN
0.5000 mg | Freq: Four times a day (QID) | RESPIRATORY_TRACT | Status: DC
  Administered 2018-02-12 – 2018-02-16 (×16): 0.5 mg via RESPIRATORY_TRACT
  Filled 2018-02-12 (×16): qty 2.5

## 2018-02-12 MED ORDER — DOCUSATE SODIUM 50 MG/5ML PO LIQD: 200.0000 mg | Freq: Every evening | ORAL | Status: DC | PRN

## 2018-02-12 MED ORDER — METHYLPREDNISOLONE SODIUM SUCC 40 MG IJ SOLR
40.0000 mg | Freq: Two times a day (BID) | INTRAMUSCULAR | Status: DC
  Administered 2018-02-12 – 2018-02-16 (×9): 40 mg via INTRAVENOUS
  Filled 2018-02-12 (×8): qty 1

## 2018-02-12 MED ORDER — ETOMIDATE 2 MG/ML IV SOLN
20.0000 mg | Freq: Once | INTRAVENOUS | Status: AC
  Administered 2018-02-17: 20 mg via INTRAVENOUS

## 2018-02-12 MED ORDER — SODIUM CHLORIDE 0.9 % IV SOLN
INTRAVENOUS | Status: DC
  Administered 2018-02-12: 14:00:00 via INTRAVENOUS
  Administered 2018-02-16: 500 mL via INTRAVENOUS
  Administered 2018-02-18 – 2018-02-21 (×2): via INTRAVENOUS

## 2018-02-12 MED ORDER — VANCOMYCIN HCL 10 G IV SOLR
2000.0000 mg | Freq: Once | INTRAVENOUS | Status: DC
  Filled 2018-02-12 (×2): qty 2000

## 2018-02-12 MED ORDER — CHLORHEXIDINE GLUCONATE CLOTH 2 % EX PADS
6.0000 | MEDICATED_PAD | Freq: Every day | CUTANEOUS | Status: DC
  Administered 2018-02-12 – 2018-02-26 (×11): 6 via TOPICAL

## 2018-02-12 MED ORDER — ALBUTEROL SULFATE (2.5 MG/3ML) 0.083% IN NEBU
2.5000 mg | INHALATION_SOLUTION | RESPIRATORY_TRACT | Status: DC | PRN
Start: 2018-02-12 — End: 2018-02-26
  Administered 2018-02-13: 2.5 mg via RESPIRATORY_TRACT
  Filled 2018-02-12: qty 3

## 2018-02-12 MED ORDER — ALPRAZOLAM 0.25 MG PO TABS: 0.2500 mg | ORAL_TABLET | Freq: Three times a day (TID) | ORAL | Status: DC | PRN

## 2018-02-12 MED ORDER — PIPERACILLIN-TAZOBACTAM 3.375 G IVPB
3.3750 g | Freq: Three times a day (TID) | INTRAVENOUS | Status: DC
  Filled 2018-02-12: qty 50

## 2018-02-12 MED ORDER — AMIODARONE HCL 200 MG PO TABS
200.0000 mg | ORAL_TABLET | Freq: Every day | ORAL | Status: DC
  Administered 2018-02-13 – 2018-02-20 (×8): 200 mg
  Filled 2018-02-12 (×8): qty 1

## 2018-02-12 MED ORDER — VANCOMYCIN HCL 10 G IV SOLR
2000.0000 mg | Freq: Once | INTRAVENOUS | Status: AC
  Administered 2018-02-12: 2000 mg via INTRAVENOUS
  Filled 2018-02-12: qty 2000

## 2018-02-12 MED ORDER — FENTANYL 2500MCG IN NS 250ML (10MCG/ML) PREMIX INFUSION
0.0000 ug/h | INTRAVENOUS | Status: DC
  Administered 2018-02-12: 100 ug/h via INTRAVENOUS
  Administered 2018-02-13: 175 ug/h via INTRAVENOUS
  Administered 2018-02-13 – 2018-02-14 (×2): 200 ug/h via INTRAVENOUS
  Administered 2018-02-14: 225 ug/h via INTRAVENOUS
  Administered 2018-02-15: 325 ug/h via INTRAVENOUS
  Administered 2018-02-15 (×2): 250 ug/h via INTRAVENOUS
  Administered 2018-02-16: 350 ug/h via INTRAVENOUS
  Administered 2018-02-16: 250 ug/h via INTRAVENOUS
  Administered 2018-02-16 – 2018-02-17 (×2): 300 ug/h via INTRAVENOUS
  Administered 2018-02-17: 250 ug/h via INTRAVENOUS
  Administered 2018-02-18: 300 ug/h via INTRAVENOUS
  Filled 2018-02-12 (×15): qty 250

## 2018-02-12 MED ORDER — NOREPINEPHRINE 4 MG/250ML-% IV SOLN
0.0000 ug/min | INTRAVENOUS | Status: DC
  Administered 2018-02-12: 20 ug/min via INTRAVENOUS
  Administered 2018-02-12: 5 ug/min via INTRAVENOUS
  Administered 2018-02-14: 2 ug/min via INTRAVENOUS
  Filled 2018-02-12 (×4): qty 250

## 2018-02-12 MED ORDER — CHLORHEXIDINE GLUCONATE CLOTH 2 % EX PADS
6.0000 | MEDICATED_PAD | Freq: Every day | CUTANEOUS | Status: AC
  Administered 2018-02-13 – 2018-02-17 (×2): 6 via TOPICAL

## 2018-02-12 MED ORDER — MUPIROCIN 2 % EX OINT
1.0000 "application " | TOPICAL_OINTMENT | Freq: Two times a day (BID) | CUTANEOUS | Status: AC
  Administered 2018-02-12 – 2018-02-17 (×10): 1 via NASAL
  Filled 2018-02-12: qty 22

## 2018-02-12 MED ORDER — LEVOTHYROXINE SODIUM 75 MCG PO TABS
150.0000 ug | ORAL_TABLET | Freq: Every day | ORAL | Status: DC
  Administered 2018-02-13 – 2018-02-23 (×11): 150 ug
  Filled 2018-02-12 (×10): qty 2

## 2018-02-12 MED ORDER — SODIUM CHLORIDE 0.9 % IV BOLUS
1000.0000 mL | Freq: Once | INTRAVENOUS | Status: AC
  Administered 2018-02-12: 1000 mL via INTRAVENOUS

## 2018-02-12 MED ORDER — APIXABAN 5 MG PO TABS
5.0000 mg | ORAL_TABLET | Freq: Two times a day (BID) | ORAL | Status: DC
  Administered 2018-02-12 – 2018-02-13 (×2): 5 mg
  Filled 2018-02-12 (×2): qty 1

## 2018-02-12 MED ORDER — SODIUM CHLORIDE 0.9 % IV SOLN
1.0000 g | Freq: Three times a day (TID) | INTRAVENOUS | Status: DC
  Filled 2018-02-12: qty 1

## 2018-02-12 MED ORDER — ESCITALOPRAM OXALATE 20 MG PO TABS
20.0000 mg | ORAL_TABLET | Freq: Every day | ORAL | Status: DC
Start: 2018-02-13 — End: 2018-02-23
  Administered 2018-02-13 – 2018-02-22 (×10): 20 mg
  Filled 2018-02-12 (×11): qty 1

## 2018-02-12 NOTE — Progress Notes (Signed)
PROGRESS NOTE    Wayne Booth  GHW:[id number] DOB: 24-Dec-1940 DOA: 02/02/2018 PCP: Marin Olp, MD    Brief Narrative:  77 year old male, morbidly obese, with past medical history significant for atrial fibrillation status post recent ablation, OSA but noncompliant with CPAP, hypothyroidism, history of pulmonary embolism, hypertension, anxiety, nephrolithiasis, documented renal and prostate cancer. Patient is said to have undergone nephrectomy, and has one kidney. Patient was admitted with shortness of breath, right-sided chest pain, fever, small pericardial effusion and worsening renal function.  CT chest shows multilobar pneumonia.  Patient on broad-spectrum antibiotics cefepime, negative MRSA PCR screen.  D-dimer elevated ruled out for PE with low probability VQ scan, heparin switch to Eliquis. Also element of volume overload, cardiology following diuresing, creatinine improving. Cardiac BMP done on 02/02/2018 was 54.3, and troponin has been 0.03 on 2 occasions.   Assessment & Plan:   Active Problems:   History of renal cell carcinoma   Essential hypertension   OSA (obstructive sleep apnea)   Hypothyroidism   History of pulmonary embolism   Paroxysmal atrial fibrillation (HCC)   Acute respiratory failure with hypoxia (HCC)   HCAP (healthcare-associated pneumonia)   Pericardial effusion   SOB (shortness of breath)   Elevated troponin   Acute on chronic diastolic heart failure (HCC)   Dyspnea   Pneumonia of right middle lobe due to infectious organism [facility])  Bilateral multilobar lobe pneumonia- on CT 02/03/18.  Low probability VQ scan.  4th day on broad-spectrum antibiotics cefepime, vancomycin discontinued, negative MRSA PCR. -Urine strep and Legionella negative -Patient still hypoxic, never smoker, is on home O2 -Patient had been continued on Cefepime with added azithromycin for atypical coverage -Completed 10 days of abx as of 8/16 -This AM, patient noted to have marked  sob, decreased BS, increased O2 requirements -Patient seen with Cardiology. Likely not pulm edema -STAT CXR ordered and reviewed. Findings suggesting worsening multifocal PNA with worsening L effusion -Had started Vancomycin and resumed Cefepime for treatment of HCAP. Broadening abx coverage -RRT was called and discussed case with PCCM. Patient transferred to ICU  Hypoxic acute resp failure/ dyspnea- dyspnea combination of underlying COPD (on home O2) w/ new bilat PNA and also suspected diast CHF issues.  BNP 54, but patient morbidly obese - good diuresis noted with IV lasix earlier this course -Cardiology is following -With rising Cr, lasix now on hold. Repeat BNP reviewed, unremarkable -Renal function worsening -Per above, patient with markedly worse respiratory effort and O2 requirements, transferred to ICU -Increased duonebs and start IV steroids  LLE erythema/ edema - doppler done, negative for DVT - outlined w/ marker, erythema better today, cont diuresis /abx as per above for possible cellulitis -remains stable at this time  Right-sided chest pain- prior history of pulmonary embolism.  V/Q low probability. Patient has history of prostate and renal cancer. - Patient recently underwent ablation by the cardiology team for atrial fibrillation. -02/05/2018: Transitioned off heparin drip back to Eliquis. -Pt completed 10 days of abx, since resumed  Acute kidney injury-initially improving with diuresis, poss cardiorenal -Cr has risen to 1.47 with lasix on hold -Lasix currently on hold per Cardiology -Renal function is worsening  Paroxysmal atrial fibrillation - Continue Cardizem 240 mg qd and amio 200mg  as at home - holding home metoprolol 12.5 bid - recent ablation procedure w/ L groin bruising - on eliquis 5 mg bid - tachycardic this AM while hypoxemic  Hypertension, essential - Continue Cardizem 240 qd, metop on hold for now - BP  remains stable at this time. Continue to  monitor bp and adjust as needed  Hypothyroidism - Continue synthroid 150 mcg daily as tolerated - Seems to be stable at this time  Morbid obesity/OSA - Patient tolerating cpap - Refused cpap overnight  Anxiety and depression - Continue lexapro and nortriptyline as at home - having side effects/ agitation from Ambien , will dc and label as intolerance - cont xanax tid as pt tolerates - takes suvorexant at home for sleep, has since been ordered  Constipation -Good results with dulcolax suppository and lactulose PO recently  L Eye irritation -Patient with L eye irritation, little/no relief with saline -Prescribed antibiotic drops  DVT prophylaxis: eliquis Code Status: Full Family Communication: Pt in room, family at bedside Disposition Plan: Uncertain at this time  Consultants:   Cardiology  Procedures:   ECHO - EF 55%  Antimicrobials: Anti-infectives (From admission, onward)   Start     Dose/Rate Route Frequency Ordered Stop   02/06/18 1100  azithromycin (ZITHROMAX) 500 mg in sodium chloride 0.9 % 250 mL IVPB  Status:  Discontinued     500 mg 250 mL/hr over 60 Minutes Intravenous Every 24 hours 02/06/18 1024 02/12/18 1218   02/03/18 2000  vancomycin (VANCOCIN) 1,500 mg in sodium chloride 0.9 % 500 mL IVPB  Status:  Discontinued     1,500 mg 250 mL/hr over 120 Minutes Intravenous Every 24 hours 02/02/18 1948 02/03/18 1122   02/03/18 0400  ceFEPIme (MAXIPIME) 1 g in sodium chloride 0.9 % 100 mL IVPB     1 g 200 mL/hr over 30 Minutes Intravenous Every 8 hours 02/03/18 0007 02/10/18 2254   02/02/18 1845  vancomycin (VANCOCIN) 2,500 mg in sodium chloride 0.9 % 500 mL IVPB     2,500 mg 250 mL/hr over 120 Minutes Intravenous STAT 02/02/18 1832 02/02/18 2154   02/02/18 1845  ceFEPIme (MAXIPIME) 2 g in sodium chloride 0.9 % 100 mL IVPB     2 g 200 mL/hr over 30 Minutes Intravenous  Once 02/02/18 1833 02/02/18 1916   02/02/18 1815  ceFEPIme (MAXIPIME) 1 g in sodium  chloride 0.9 % 100 mL IVPB  Status:  Discontinued     1 g 200 mL/hr over 30 Minutes Intravenous  Once 02/02/18 1802 02/02/18 1833      Subjective: Much more sob this AM thus difficult to obtain  Objective: Vitals:   02/12/18 0817 02/12/18 0835 02/12/18 1024 02/12/18 1201  BP:  106/86    Pulse:  (!) 113    Resp:  (!) 26    Temp:  97.8 F (36.6 C)    TempSrc:  Oral    SpO2: 95% 92% 95% (!) 87%  Weight:      Height:        Intake/Output Summary (Last 24 hours) at 02/12/2018 1227 Last data filed at 02/12/2018 0900 Gross per 24 hour  Intake 240 ml  Output 750 ml  Net -510 ml   Filed Weights   02/09/18 0928 02/10/18 0400 02/11/18 0700  Weight: 121.9 kg 131.1 kg 124.9 kg    Examination: General exam: difficult to speak with markedly increased resp effort Respiratory system: decreased BS, no wheezing Cardiovascular system: tachycardic, s1-s2 Gastrointestinal system: Nondistended, nontender, pos BS Central nervous system: No seizures, no tremors Extremities: No cyanosis, no joint deformities Skin: No rashes, no pallor Psychiatry: Difficult to assess given acute decompensation  Data Reviewed: I have personally reviewed following labs and imaging studies  CBC: Recent Labs  Lab 02/06/18  0340 02/07/18 0320 02/08/18 0300  WBC 12.8* 14.1* 14.1*  NEUTROABS 8.2*  --   --   HGB 11.8* 12.4* 12.1*  HCT 36.1* 37.7* 37.7*  MCV 90.7 90.8 90.6  PLT 338 384 160*   Basic Metabolic Panel: Recent Labs  Lab 02/06/18 0340  02/08/18 0954 02/09/18 0326 02/10/18 0252 02/11/18 0326 02/12/18 0322  NA 132*   < > 133* 131* 132* 131* 130*  K 3.2*   < > 3.5 3.5 4.0 4.5 4.4  CL 94*   < > 94* 95* 95* 96* 93*  CO2 28   < > 28 27 27 28 27   GLUCOSE 116*   < > 103* 111* 117* 121* 144*  BUN 18   < > 20 20 23  24* 25*  CREATININE 1.56*   < > 1.47* 1.30* 1.43* 1.47* 1.60*  CALCIUM 8.3*   < > 8.4* 8.4* 8.5* 8.6* 8.7*  MG 2.0  2.0  --   --   --   --   --   --   PHOS 3.7  --   --   --   --    --   --    < > = values in this interval not displayed.   GFR: Estimated Creatinine Clearance: 53.7 mL/min (A) (by C-G formula based on SCr of 1.6 mg/dL (H)). Liver Function Tests: Recent Labs  Lab 02/06/18 0340  ALBUMIN 2.5*   No results for input(s): LIPASE, AMYLASE in the last 168 hours. No results for input(s): AMMONIA in the last 168 hours. Coagulation Profile: No results for input(s): INR, PROTIME in the last 168 hours. Cardiac Enzymes: No results for input(s): CKTOTAL, CKMB, CKMBINDEX, TROPONINI in the last 168 hours. BNP (last 3 results) No results for input(s): PROBNP in the last 8760 hours. HbA1C: No results for input(s): HGBA1C in the last 72 hours. CBG: No results for input(s): GLUCAP in the last 168 hours. Lipid Profile: No results for input(s): CHOL, HDL, LDLCALC, TRIG, CHOLHDL, LDLDIRECT in the last 72 hours. Thyroid Function Tests: No results for input(s): TSH, T4TOTAL, FREET4, T3FREE, THYROIDAB in the last 72 hours. Anemia Panel: No results for input(s): VITAMINB12, FOLATE, FERRITIN, TIBC, IRON, RETICCTPCT in the last 72 hours. Sepsis Labs: No results for input(s): PROCALCITON, LATICACIDVEN in the last 168 hours.  Recent Results (from the past 240 hour(s))  Culture, blood (routine x 2)     Status: None   Collection Time: 02/02/18  5:27 PM  Result Value Ref Range Status   Specimen Description BLOOD BLOOD LEFT FOREARM  Final   Special Requests   Final    BOTTLES DRAWN AEROBIC AND ANAEROBIC Blood Culture results may not be optimal due to an inadequate volume of blood received in culture bottles   Culture   Final    NO GROWTH 5 DAYS Performed at [facility], [place] [address], [place], [place] [address]    Report Status 02/07/2018 FINAL  Final  Culture, blood (routine x 2)     Status: None   Collection Time: 02/02/18  6:35 PM  Result Value Ref Range Status   Specimen Description BLOOD RIGHT HAND  Final   Special Requests   Final    BOTTLES DRAWN  AEROBIC AND ANAEROBIC Blood Culture adequate volume   Culture   Final    NO GROWTH 5 DAYS Performed at [facility], [place] [address], [place], [place] [address]    Report Status 02/08/2018 FINAL  Final  MRSA PCR  Screening     Status: None   Collection Time: 02/03/18  2:44 AM  Result Value Ref Range Status   MRSA by PCR NEGATIVE NEGATIVE Final    Comment:        The GeneXpert MRSA Assay (FDA approved for NASAL specimens only), is one component of a comprehensive MRSA colonization surveillance program. It is not intended to diagnose MRSA infection nor to guide or monitor treatment for MRSA infections. Performed at [facility], [place] [address] [place], [place] [address]   Culture, sputum-assessment     Status: None   Collection Time: 02/03/18  5:08 PM  Result Value Ref Range Status   Specimen Description EXPECTORATED SPUTUM  Final   Special Requests NONE  Final   Sputum evaluation   Final    THIS SPECIMEN IS ACCEPTABLE FOR SPUTUM CULTURE Performed at [facility], [address] [place], [place] [address]    Report Status 02/03/2018 FINAL  Final  Culture, respiratory     Status: None   Collection Time: 02/03/18  5:08 PM  Result Value Ref Range Status   Specimen Description EXPECTORATED SPUTUM  Final   Special Requests NONE Reflexed from [id number]  Final   Gram Stain   Final    ABUNDANT WBC PRESENT, PREDOMINANTLY PMN MODERATE GRAM POSITIVE COCCI    Culture   Final    Consistent with normal respiratory flora. Performed at [facility], [place] [address], [place],  [address]    Report Status 02/06/2018 FINAL  Final     Radiology Studies: Dg Chest Port 1 View  Result Date: 02/12/2018 CLINICAL DATA:  Hypoxemia. EXAM: PORTABLE CHEST 1 VIEW COMPARISON:  02/10/2018 prior radiographs FINDINGS: Cardiomegaly and bilateral airspace opacities again noted. LEFT LOWER lung consolidation/atelectasis is unchanged. No pneumothorax identified. Probable small  bilateral pleural effusions again noted. Little interval change since the prior study. IMPRESSION: Unchanged appearance of the chest with bilateral airspace opacities and LEFT LOWER lung consolidation/atelectasis Electronically Signed   By: Margarette Canada M.D.   On: 02/12/2018 11:41    Scheduled Meds: . allopurinol  150 mg Oral Daily  . amiodarone  200 mg Oral Daily  . apixaban  5 mg Oral BID  . bacitracin-polymyxin b   Left Eye QHS  . budesonide (PULMICORT) nebulizer solution  0.25 mg Nebulization BID  . colchicine  0.6 mg Oral Daily  . diltiazem  240 mg Oral Daily  . docusate sodium  200 mg Oral QHS  . escitalopram  20 mg Oral Daily  . ipratropium-albuterol  3 mL Nebulization BID  . levothyroxine  150 mcg Oral QAC breakfast  . mouth rinse  15 mL Mouth Rinse BID  . methylPREDNISolone sodium succinate      . methylPREDNISolone (SOLU-MEDROL) injection  40 mg Intravenous BID  . metoprolol tartrate  12.5 mg Oral BID  . multivitamin with minerals  1 tablet Oral Daily  . nortriptyline  75 mg Oral QHS  . Suvorexant  10 mg Oral QHS   Continuous Infusions:    LOS: 10 days   Marylu Lund, MD [facility] Pager [phone]  If 7PM-7AM, please contact night-coverage [email or web address] Password [facility] 02/12/2018, 12:27 PM

## 2018-02-12 NOTE — Progress Notes (Signed)
Pharmacy Antibiotic Note  ERNESTO ZUKOWSKI is a 77 y.o. male admitted on 02/02/2018. Treated for PNA but now worsening and worrisome for continued pneumonia.  Pharmacy has been consulted for vancomycin and cefepime dosing.  Plan: Restart cefepime 1g IV q8h  Give vancomycin 2g IV x 1, then restart vancomycin 1.5g IV Q24h Monitor clinical picture, renal function, VT prn F/U C&S, abx deescalation / LOT  Height: 5\' 11"  (180.3 cm) Weight: (unable to stand due to shortness of breath) IBW/kg (Calculated) : 75.3  Temp (24hrs), Avg:98 F (36.7 C), Min:97.5 F (36.4 C), Max:98.5 F (36.9 C)  Recent Labs  Lab 02/06/18 0340 02/07/18 0320 02/08/18 0300 02/08/18 0954 02/09/18 0326 02/10/18 0252 02/11/18 0326 02/12/18 0322  WBC 12.8* 14.1* 14.1*  --   --   --   --   --   CREATININE 1.56* 1.48*  --  1.47* 1.30* 1.43* 1.47* 1.60*    Estimated Creatinine Clearance: 53.7 mL/min (A) (by C-G formula based on SCr of 1.6 mg/dL (H)).    Allergies  Allergen Reactions  . Ambien [Zolpidem] Anxiety    Anxiety and agitation when tried as sleeper in hospital aug 2019    Thank you for allowing pharmacy to be a part of this patient's care.  Reginia Naas 02/12/2018 12:35 PM

## 2018-02-12 NOTE — Procedures (Signed)
Endotracheal intubation  Dictation: Respiratory extremis  Procedure: After explaining the indication to the patient and his wife it was elected to proceed with intubation for respiratory distress.  The patient's upper airway was inspected and found to be class I.  An upper plate was removed.  The patient was preoxygenated with an Ambu bag then 20 mg of etomidate followed by 60 mg of rocuronium administered.  The vocal cords were easily visualized with a 4 Miller blade and an 8 endotracheal tube was passed to 23 cm at the lip.  There was positive CO2 and symmetric air movement.  Chest x-ray shows a well-placed endotracheal tube.

## 2018-02-12 NOTE — Progress Notes (Signed)
Peripherally Inserted Central Catheter/Midline Placement  The IV Nurse has discussed with the patient and/or persons authorized to consent for the patient, the purpose of this procedure and the potential benefits and risks involved with this procedure.  The benefits include less needle sticks, lab draws from the catheter, and the patient may be discharged home with the catheter. Risks include, but not limited to, infection, bleeding, blood clot (thrombus formation), and puncture of an artery; nerve damage and irregular heartbeat and possibility to perform a PICC exchange if needed/ordered by physician.  Alternatives to this procedure were also discussed.  Bard Power PICC patient education guide, fact sheet on infection prevention and patient information card has been provided to patient /or left at bedside. Wife  And sons at bedside verbalize understanding .  Wife signed informed consent.    PICC/Midline Placement Documentation  PICC Triple Lumen 38/93/73 PICC Right Basilic 47 cm 2 cm (Active)  Indication for Insertion or Continuance of Line Vasoactive infusions 02/12/2018  6:13 PM  Exposed Catheter (cm) 2 cm 02/12/2018  6:13 PM  Site Assessment Clean;Dry;Intact 02/12/2018  6:13 PM  Lumen #1 Status Flushed;Saline locked;Blood return noted 02/12/2018  6:13 PM  Lumen #2 Status Flushed;Saline locked;Blood return noted 02/12/2018  6:13 PM  Lumen #3 Status Flushed;Saline locked;Blood return noted 02/12/2018  6:13 PM  Dressing Type Transparent 02/12/2018  6:13 PM  Dressing Status Clean;Dry;Intact;Antimicrobial disc in place 02/12/2018  6:13 PM  Line Care Connections checked and tightened 02/12/2018  6:13 PM  Line Adjustment (NICU/IV Team Only) No 02/12/2018  6:13 PM  Dressing Intervention New dressing 02/12/2018  6:13 PM  Dressing Change Due 02/19/18 02/12/2018  6:13 PM       Rolena Infante 02/12/2018, 6:14 PM

## 2018-02-12 NOTE — Progress Notes (Addendum)
Pharmacy Antibiotic Note  Wayne Booth is a 77 y.o. male admitted on 02/02/2018. Treated for PNA but now worsening and worrisome for continued pneumonia.  Pharmacy has been consulted for vancomycin and zosyn dosing. Cefepime d/c'd per CCM.  Plan: D/c cefepime > Zosyn 3.375g IV q8h (4h inf) Give vancomycin 2g IV x 1, then restart vancomycin 1.5g IV Q24h Monitor clinical picture, renal function, VT prn F/U C&S, abx deescalation / LOT  PM update - RN called, vanc never given this AM. Will reschedule doses to start now.  Height: 5\' 11"  (180.3 cm) Weight: (unable to stand due to shortness of breath) IBW/kg (Calculated) : 75.3  Temp (24hrs), Avg:98.1 F (36.7 C), Min:97.8 F (36.6 C), Max:98.5 F (36.9 C)  Recent Labs  Lab 02/06/18 0340 02/07/18 0320 02/08/18 0300 02/08/18 0954 02/09/18 0326 02/10/18 0252 02/11/18 0326 02/12/18 0322  WBC 12.8* 14.1* 14.1*  --   --   --   --   --   CREATININE 1.56* 1.48*  --  1.47* 1.30* 1.43* 1.47* 1.60*    Estimated Creatinine Clearance: 53.7 mL/min (A) (by C-G formula based on SCr of 1.6 mg/dL (H)).    Allergies  Allergen Reactions  . Ambien [Zolpidem] Anxiety    Anxiety and agitation when tried as sleeper in hospital aug 2019    Elicia Lamp, PharmD, BCPS Clinical Pharmacist Clinical phone [phone] Please check AMION for all [facility] contact numbers 02/12/2018 1:13 PM

## 2018-02-12 NOTE — Progress Notes (Signed)
Spoke with Dr Pearline Cables re PICC line.  States ok to place PICC with blood cultures pending/drawn today.

## 2018-02-12 NOTE — Progress Notes (Signed)
Late Note: Patient having increasing work of breathing and anxiety, despite being given Xanax and nebulizer treatments. Spoke with Dr. Wyline Copas, who noted patient was more diminished upon assessment. Solu-Medrol ordered and given with no improvement. Patient complaining of nausea and unable to tolerate BiPAP. Continued to have severe dyspnea and tachypnea at rest. Spoke with RT Timolyn about patient condition and it was agreed to consult rapid response. RRT nurse evaluated patient and facilitated transfer to 57M ICU. Report given at bedside to [facility], [place].

## 2018-02-12 NOTE — Progress Notes (Signed)
Patient requested to be taken off BiPAP.  He complained his mouth was too dry.  This nurse offered to provide mouth care so patient could continue with BiPAP, but patient refused.

## 2018-02-12 NOTE — Plan of Care (Signed)
Discussed plan of care with patient.  Stressed the importance of wearing BiPAP at night.  Patient wore BiPAP for approximately 2 hours and then requested it be removed.  Also emphasized the importance of pain management.  Minimal teach back displayed.

## 2018-02-12 NOTE — Significant Event (Signed)
Rapid Response Event Note RT asked me to look at pt for increase WOB  Overview: Time Called: 1130 Arrival Time: 1135 Event Type: Respiratory  Initial Focused Assessment: On arrival pt sitting up right and leaning forward in recliner, labored breathing, fragmented sentences, requesting to walk or roll around the unit, stated he felt "very agitated", skin cool and clammy, alert and oriented. Pt refusing to wear his Bipap as he continued to feel "anxious and contained to a bed or a chair." BBS not moving a lot of air throughout. HR 120's, RR 40's, 84-88% 4L [place]  Transferred to 46m11, intubated after arrival to unit.  Interventions: duoneb  CXR Solumedrol 40 mg IVP ABG  IV RUC SL   Event Summary: Name of Physician Notified: Dr. Wyline Copas at 1145  Name of Consulting Physician Notified: Dr. Pearline Cables  at 1150  Outcome: Transferred (Comment)(2M11)  Event End Time: [facility], [place]

## 2018-02-12 NOTE — Consult Note (Signed)
PULMONARY / CRITICAL CARE MEDICINE   Name: GEOVANNIE VILAR MRN: [id number] DOB: 06/11/1941    ADMISSION DATE:  02/02/2018 CONSULTATION DATE: 8/17/201  CHIEF COMPLAINT: Respiratory extremis   HISTORY OF PRESENT ILLNESS:        This is a 77 year old with a history of atrial fibrillation and diastolic dysfunction who presented with right-sided pleuritic chest pain fever and dyspnea.  He was treated with an empiric combination of azithromycin and Zosyn and at the same time was diuresed for diastolic failure.  Overnight last night he has had increasing dyspnea.  He tells me that he has had midline chest pain is not clear whether or not this is been pleuritic.  There is no radiation.  He is currently not having any productive cough.  Patchy infiltrates on the CT scan of the chest obtained on 8/8 1 of them up appears consistent with a peripheral nodule.  He has a history of both prostate cancer and renal cancer and he tells me that both of these have undergone curative treatment.  PAST MEDICAL HISTORY :  He  has a past medical history of Allergy, Anxiety, Arthritis, Atrial fibrillation ([place]), Cancer ([place]), Coronary artery disease, History of total knee replacement, Hypertension, Hypothyroidism, Kidney cancer, primary, with metastasis from kidney to other site [facility] [place]), Kidney stone, OSA (obstructive sleep apnea), Pituitary cyst ([place]), Pneumonia, and Prostate cancer ([place]).  PAST SURGICAL HISTORY: He  has a past surgical history that includes Appendectomy; Cholecystectomy; Nephrectomy (Left); Insertion prostate radiation seed; Replacement total knee bilateral; Knee arthroscopy (2007); Anterior cervical decomp/discectomy fusion (N/A, 08/29/2012); Craniotomy (N/A, 11/08/2012); Colonoscopy w/ polypectomy; Kidney stone surgery; Cardiac catheterization (2008); Posterior cervical laminectomy (Left, 04/24/2015); Finger surgery (Left, 2017); Ablation of dysrhythmic focus (01/28/2018); and ATRIAL FIBRILLATION ABLATION (N/A,  01/28/2018).  Allergies  Allergen Reactions  . Ambien [Zolpidem] Anxiety    Anxiety and agitation when tried as sleeper in hospital aug 2019    No current facility-administered medications on file prior to encounter.    Current Outpatient Medications on File Prior to Encounter  Medication Sig  . albuterol (PROVENTIL HFA;VENTOLIN HFA) 108 (90 Base) MCG/ACT inhaler Inhale 2 puffs into the lungs every 6 (six) hours as needed for wheezing or shortness of breath.  . allopurinol (ZYLOPRIM) 300 MG tablet Take 0.5 tablets (150 mg total) by mouth daily.  Marland Kitchen amiodarone (PACERONE) 200 MG tablet Take 1 tablet (200 mg total) by mouth daily.  Marland Kitchen apixaban (ELIQUIS) 5 MG TABS tablet Take 1 tablet (5 mg total) by mouth 2 (two) times daily.  . budesonide-formoterol (SYMBICORT) 160-4.5 MCG/ACT inhaler Inhale 2 puffs into the lungs 2 (two) times daily as needed (for respiratory flares).  . clotrimazole-betamethasone (LOTRISONE) cream Apply 1 application topically 2 (two) times daily. Use for 10 days maximum. (Patient taking differently: Apply 1 application topically 2 (two) times daily as needed (for rashes). )  . colchicine 0.6 MG tablet Take 1 tablet (0.6 mg total) by mouth daily.  . cyclobenzaprine (FLEXERIL) 10 MG tablet Take 10 mg by mouth daily as needed for muscle spasms.   Marland Kitchen diltiazem (CARDIZEM CD) 240 MG 24 hr capsule Take 1 capsule (240 mg total) by mouth daily.  Marland Kitchen escitalopram (LEXAPRO) 20 MG tablet TAKE ONE TABLET BY MOUTH DAILY  . furosemide (LASIX) 20 MG tablet Take 1 tablet (20 mg total) by mouth daily.  Marland Kitchen levothyroxine (SYNTHROID, LEVOTHROID) 150 MCG tablet TAKE ONE TABLET BY MOUTH DAILY  . metoprolol tartrate (LOPRESSOR) 25 MG tablet Take 0.5 tablets (12.5 mg total)  by mouth 2 (two) times daily.  . Multiple Vitamins-Minerals (MULTIVITAMINS THER. W/MINERALS) TABS Take 1 tablet by mouth daily.  . nitroGLYCERIN (NITROSTAT) 0.4 MG SL tablet Place 1 tablet (0.4 mg total) under the tongue every 5 (five)  minutes as needed for chest pain.  . nortriptyline (PAMELOR) 75 MG capsule TAKE ONE CAPSULE BY MOUTH AT BEDTIME  . Suvorexant (BELSOMRA) 10 MG TABS Take 10 mg by mouth at bedtime.    FAMILY HISTORY:  His family history includes Cancer in his father and mother; Colon cancer (age of onset: 65) in his brother; Kidney cancer in his sister. There is no history of Esophageal cancer, Rectal cancer, or Stomach cancer.  SOCIAL HISTORY: He  reports that he has quit smoking. His smoking use included cigars. He quit after 4.00 years of use. He has never used smokeless tobacco. He reports that he drinks about 2.0 standard drinks of alcohol per week. He reports that he does not use drugs.  REVIEW OF SYSTEMS:   Otherwise not obtained as the patient is in respiratory extremis SUBJECTIVE:  As above  VITAL SIGNS: BP 106/86   Pulse (!) 113   Temp 97.8 F (36.6 C) (Oral)   Resp (!) 26   Ht 5\' 10"  (1.778 m)   Wt 124.9 kg   SpO2 (!) 87%   BMI 39.50 kg/m   HEMODYNAMICS:    VENTILATOR SETTINGS: Vent Mode: PRVC FiO2 (%):  [100 %] 100 % Set Rate:  [18 bmp] 18 bmp Vt Set:  [600 mL] 600 mL PEEP:  [5 cmH20] 5 cmH20 Plateau Pressure:  [25 cmH20] 25 cmH20  INTAKE / OUTPUT: I/O last 3 completed shifts: In: 850 [P.O.:600; IV Piggyback:250] Out: 1150 [Urine:1150]  PHYSICAL EXAMINATION: General: This is an obese elderly male in obvious respiratory distress. Neuro: He is appropriately conversant and moving all fours HEENT: He has a class I airway there was an upper plate in place which I removed prior to intubation Cardiovascular: S1 and S2 are regular and somewhat distant without murmur rub or gallop Lungs: Aspirations are unlabored, there is symmetric air movement and very poor air movement throughout. Abdomen: The abdomen is grossly obese without any overt organomegaly masses or tenderness, he is anicteric Musculoskeletal: There is 1+ symmetric lower extremity edema in the feet are  cool   LABS:  BMET Recent Labs  Lab 02/10/18 0252 02/11/18 0326 02/12/18 0322  NA 132* 131* 130*  K 4.0 4.5 4.4  CL 95* 96* 93*  CO2 27 28 27   BUN 23 24* 25*  CREATININE 1.43* 1.47* 1.60*  GLUCOSE 117* 121* 144*    Electrolytes Recent Labs  Lab 02/06/18 0340  02/10/18 0252 02/11/18 0326 02/12/18 0322  CALCIUM 8.3*   < > 8.5* 8.6* 8.7*  MG 2.0  2.0  --   --   --   --   PHOS 3.7  --   --   --   --    < > = values in this interval not displayed.    CBC Recent Labs  Lab 02/06/18 0340 02/07/18 0320 02/08/18 0300  WBC 12.8* 14.1* 14.1*  HGB 11.8* 12.4* 12.1*  HCT 36.1* 37.7* 37.7*  PLT 338 384 419*    Coag's No results for input(s): APTT, INR in the last 168 hours.  Sepsis Markers No results for input(s): LATICACIDVEN, PROCALCITON, O2SATVEN in the last 168 hours.  ABG Recent Labs  Lab 02/06/18 1142  PHART 7.444  PCO2ART 43.4  PO2ART 65.5*  Liver Enzymes Recent Labs  Lab 02/06/18 0340  ALBUMIN 2.5*    Cardiac Enzymes No results for input(s): TROPONINI, PROBNP in the last 168 hours.  Glucose No results for input(s): GLUCAP in the last 168 hours.  Imaging Dg Chest Port 1 View  Result Date: 02/12/2018 CLINICAL DATA:  Hypoxemia. EXAM: PORTABLE CHEST 1 VIEW COMPARISON:  02/10/2018 prior radiographs FINDINGS: Cardiomegaly and bilateral airspace opacities again noted. LEFT LOWER lung consolidation/atelectasis is unchanged. No pneumothorax identified. Probable small bilateral pleural effusions again noted. Little interval change since the prior study. IMPRESSION: Unchanged appearance of the chest with bilateral airspace opacities and LEFT LOWER lung consolidation/atelectasis Electronically Signed   By: Margarette Canada M.D.   On: 02/12/2018 11:41     STUDIES:  EKG to my eye shows a septal Q in no acute changes he is in normal sinus rhythm CULTURES: Blood and sputum have been ordered today 8/17  ANTIBIOTICS: Comycin and Zosyn started  8/17  SIGNIFICANT EVENTS:   LINES/TUBES:   DISCUSSION:      This is an obese gentleman with a history of sleep apnea, atrial fibrillation status post ablation, history of transsphenoidal resection, history of prostate cancer with seed placement, history of cancer of the kidney status post left nephrectomy and history of nephrolithiasis who presented on 8/8 with right sided pleuritic chest pain.  He also has a history of diastolic heart failure.  He was a CTA was obtained which showed patchy infiltrates on the right and no PE.  He was empirically treated with a combination of azithromycin and cefepime but today was acutely dyspneic.  ASSESSMENT / PLAN:  PULMONARY A: Acute respiratory failure.  He is moving air very poorly on examination I suspect there is a bronchospastic component to his dyspnea.  I reviewed the admission CT of his chest and there are patchy infiltrates including a density that is pleural-based on the right which I am concerned does not represent an infectious process and may represent the provocation for his pleuritic pain on presentation.  For now I have recultured the patient, submitted lactates and procalcitonin's, and started him on a combination of vancomycin and Zosyn.  I have ordered a PSA and if it is elevated may entertain PET scan to define the nodule in the periphery of the right upper lung.  I am awaiting a post intubation blood gas.  Does have a prior history of pulmonary embolism and is chronically on Eliquis    CARDIOVASCULAR A: Patient has been diuresed for diastolic heart failure however post intubation he is cool clammy and hypotensive tensive and I am at least transiently going to bolus him with fluid to maintain hemodynamic stability.  He is maintaining sinus rhythm.  RENAL A: He has a solitary kidney, chronic renal insufficiency appears to be stable at 1.6 creatinine   GASTROINTESTINAL A: GI prophylaxis has been ordered with Pepcid  HEMATOLOGIC A:  Chronically on Eliquis    INFECTIOUS A: Patchy right-sided infiltrates.  Cultures have been ordered along with a procalcitonin.  Been started on a combination of vancomycin and Zosyn.  He already received a course of azithromycin for atypicals during this admission    ENDOCRINE A: Has a history of transsphenoidal resection and is currently on steroids and thyroid replacement  NEUROLOGIC A: No acute issues  Great turgor than 32 minutes was spent in the care of this patient with life-threatening acute respiratory failure not counting time spent performing procedures   Lars Masson, MD Critical Care Medicine  Pager: [phone]  02/12/2018, 1:16 PM

## 2018-02-12 NOTE — Progress Notes (Signed)
Progress Note  Patient Name: Wayne Booth Date of Encounter: 02/12/2018  Primary Cardiologist: Larae Grooms, MD   Subjective   SOB has been worsening, the patient now has labored breathing. .  Inpatient Medications    Scheduled Meds: . allopurinol  150 mg Oral Daily  . amiodarone  200 mg Oral Daily  . apixaban  5 mg Oral BID  . bacitracin-polymyxin b   Left Eye QHS  . budesonide (PULMICORT) nebulizer solution  0.25 mg Nebulization BID  . colchicine  0.6 mg Oral Daily  . diltiazem  240 mg Oral Daily  . docusate sodium  200 mg Oral QHS  . escitalopram  20 mg Oral Daily  . ipratropium-albuterol  3 mL Nebulization BID  . levothyroxine  150 mcg Oral QAC breakfast  . mouth rinse  15 mL Mouth Rinse BID  . methylPREDNISolone sodium succinate      . methylPREDNISolone (SOLU-MEDROL) injection  40 mg Intravenous BID  . metoprolol tartrate  12.5 mg Oral BID  . multivitamin with minerals  1 tablet Oral Daily  . nortriptyline  75 mg Oral QHS  . Suvorexant  10 mg Oral QHS   Continuous Infusions: . azithromycin 500 mg (02/11/18 1133)   PRN Meds: acetaminophen **OR** acetaminophen, ALPRAZolam, alum & mag hydroxide-simeth, ipratropium-albuterol, nitroGLYCERIN, ondansetron **OR** ondansetron (ZOFRAN) IV, polyvinyl alcohol, traZODone   Vital Signs    Vitals:   02/12/18 0300 02/12/18 0817 02/12/18 0835 02/12/18 1024  BP: (!) 121/52  106/86   Pulse: 95  (!) 113   Resp: (!) 24  (!) 26   Temp: 98.5 F (36.9 C)  97.8 F (36.6 C)   TempSrc: Oral  Oral   SpO2: 96% 95% 92% 95%  Weight:      Height:        Intake/Output Summary (Last 24 hours) at 02/12/2018 1151 Last data filed at 02/12/2018 0900 Gross per 24 hour  Intake 240 ml  Output 750 ml  Net -510 ml   Filed Weights   02/09/18 0928 02/10/18 0400 02/11/18 0700  Weight: 121.9 kg 131.1 kg 124.9 kg    Telemetry    Sinus rhythm.  No events.- Personally Reviewed  ECG    02/06/2018:  Sinus rhythm.  Rate 92 bpm.   Incomplete left bundle branch block.  QTC 500 ms.- Personally Reviewed  Physical Exam   VS:  BP 106/86   Pulse (!) 113   Temp 97.8 F (36.6 C) (Oral)   Resp (!) 26   Ht 5\' 11"  (1.803 m)   Wt 124.9 kg   SpO2 95%   BMI 38.40 kg/m  , BMI Body mass index is 38.4 kg/m. GENERAL: in acute respiratory distress.  Morbidly obese HEENT: Pupils equal round and reactive, fundi not visualized, oral mucosa unremarkable NECK:  JVD difficult to assess.  Carotid upstroke brisk and symmetric, no bruits, no thyromegaly LYMPHATICS:  No cervical adenopathy LUNGS: Bilateral crackles HEART:  RRR.  PMI not displaced or sustained,S1 and S2 within normal limits, no S3, no S4, no clicks, no rubs, no murmurs ABD:  Flat, positive bowel sounds normal in frequency in pitch, no bruits, no rebound, no guarding, no midline pulsatile mass, no hepatomegaly, no splenomegaly EXT:  2 plus pulses throughout, 1+ LE edema to upper tibia bilaterally, L>R, no cyanosis no clubbing SKIN:  No rashes no nodules.  Erythema of the left lower extremity to the lower tibia NEURO:  Cranial nerves II through XII grossly intact, motor grossly intact throughout PSYCH:  Cognitively intact, oriented to person place and time   Labs    Chemistry Recent Labs  Lab 02/06/18 0340  02/10/18 0252 02/11/18 0326 02/12/18 0322  NA 132*   < > 132* 131* 130*  K 3.2*   < > 4.0 4.5 4.4  CL 94*   < > 95* 96* 93*  CO2 28   < > 27 28 27   GLUCOSE 116*   < > 117* 121* 144*  BUN 18   < > 23 24* 25*  CREATININE 1.56*   < > 1.43* 1.47* 1.60*  CALCIUM 8.3*   < > 8.5* 8.6* 8.7*  ALBUMIN 2.5*  --   --   --   --   GFRNONAA 42*   < > 46* 45* 41*  GFRAA 48*   < > 54* 52* 47*  ANIONGAP 10   < > 10 7 10    < > = values in this interval not displayed.     Hematology Recent Labs  Lab 02/06/18 0340 02/07/18 0320 02/08/18 0300  WBC 12.8* 14.1* 14.1*  RBC 3.98* 4.15* 4.16*  HGB 11.8* 12.4* 12.1*  HCT 36.1* 37.7* 37.7*  MCV 90.7 90.8 90.6  MCH 29.6  29.9 29.1  MCHC 32.7 32.9 32.1  RDW 14.3 14.1 14.3  PLT 338 384 419*    Cardiac Enzymes No results for input(s): TROPONINI in the last 168 hours.  No results for input(s): TROPIPOC in the last 168 hours.   BNP Recent Labs  Lab 02/11/18 0957  BNP 64.4     DDimer  No results for input(s): DDIMER in the last 168 hours.   Radiology    Dg Chest Port 1 View  Result Date: 02/12/2018 CLINICAL DATA:  Hypoxemia. EXAM: PORTABLE CHEST 1 VIEW COMPARISON:  02/10/2018 prior radiographs FINDINGS: Cardiomegaly and bilateral airspace opacities again noted. LEFT LOWER lung consolidation/atelectasis is unchanged. No pneumothorax identified. Probable small bilateral pleural effusions again noted. Little interval change since the prior study. IMPRESSION: Unchanged appearance of the chest with bilateral airspace opacities and LEFT LOWER lung consolidation/atelectasis Electronically Signed   By: Margarette Canada M.D.   On: 02/12/2018 11:41    Cardiac Studies     Patient Profile     77 y.o. male with morbid obesity, atrial fibrillation s/p recent ablation, OSA noncompliant with CPAP, hypothyroidism, prior PE, renal and prostate cancer s/p nephrectomy here with multilobar pneumonia, cellulitis, and acute on chronic diastolic heart failure.  Assessment & Plan    # Multilobar pneumonia: worsening with hypoxia and labored breathing, the patient is being transferred to ICU  # Hypoxic respiratory failure: As above.   # Acute on chronic diastolic heart failure:  The patient is not responding to lasix, Crea is worsening, BNP is normal, this is most probably sec to multilobar pneumonia rather than CHF>  # Hypertension: controlled  # AKI:    # Hyponatremia:  Hold lasix  # Paroxysmal atrial fibrillation: S/p ablation.  He is maintaining sinus rhythm.  Continue amiodarone, metoprolol, diltiazem, and Eliquis.  For questions or updates, please contact [place] Please consult [email or web address] for  contact info under Cardiology/STEMI.      Signed, Ena Dawley, MD  02/12/2018, 11:51 AM

## 2018-02-13 ENCOUNTER — Inpatient Hospital Stay (HOSPITAL_COMMUNITY): Payer: Medicare Other

## 2018-02-13 LAB — GLUCOSE, CAPILLARY
GLUCOSE-CAPILLARY: 174 mg/dL — AB (ref 70–99)
GLUCOSE-CAPILLARY: 129 mg/dL — AB (ref 70–99)
GLUCOSE-CAPILLARY: 142 mg/dL — AB (ref 70–99)
Glucose-Capillary: 135 mg/dL — ABNORMAL HIGH (ref 70–99)
Glucose-Capillary: 130 mg/dL — ABNORMAL HIGH (ref 70–99)

## 2018-02-13 LAB — BASIC METABOLIC PANEL
ANION GAP: 11 (ref 5–15)
ANION GAP: 8 (ref 5–15)
Anion gap: 8 (ref 5–15)
BUN: 42 mg/dL — ABNORMAL HIGH (ref 8–23)
BUN: 43 mg/dL — AB (ref 8–23)
BUN: 46 mg/dL — ABNORMAL HIGH (ref 8–23)
CALCIUM: 8.3 mg/dL — AB (ref 8.9–10.3)
CO2: 22 mmol/L (ref 22–32)
CO2: 24 mmol/L (ref 22–32)
CO2: 23 mmol/L (ref 22–32)
CREATININE: 2.59 mg/dL — AB (ref 0.61–1.24)
CREATININE: 2.44 mg/dL — AB (ref 0.61–1.24)
Calcium: 7.8 mg/dL — ABNORMAL LOW (ref 8.9–10.3)
Calcium: 7.6 mg/dL — ABNORMAL LOW (ref 8.9–10.3)
Chloride: 96 mmol/L — ABNORMAL LOW (ref 98–111)
Chloride: 97 mmol/L — ABNORMAL LOW (ref 98–111)
Chloride: 97 mmol/L — ABNORMAL LOW (ref 98–111)
Creatinine, Ser: 2.54 mg/dL — ABNORMAL HIGH (ref 0.61–1.24)
GFR, EST AFRICAN AMERICAN: 26 mL/min — AB (ref >60)
GFR, EST AFRICAN AMERICAN: 28 mL/min — AB (ref >60)
GFR, EST AFRICAN AMERICAN: 27 mL/min — AB (ref >60)
GFR, EST NON AFRICAN AMERICAN: 23 mL/min — AB (ref >60)
GFR, EST NON AFRICAN AMERICAN: 24 mL/min — AB (ref >60)
GFR, EST NON AFRICAN AMERICAN: 23 mL/min — AB (ref >60)
GLUCOSE: 163 mg/dL — AB (ref 70–99)
GLUCOSE: 173 mg/dL — AB (ref 70–99)
Glucose, Bld: 183 mg/dL — ABNORMAL HIGH (ref 70–99)
POTASSIUM: 4.7 mmol/L (ref 3.5–5.1)
Potassium: 5.1 mmol/L (ref 3.5–5.1)
Potassium: 4.8 mmol/L (ref 3.5–5.1)
SODIUM: 129 mmol/L — AB (ref 135–145)
Sodium: 129 mmol/L — ABNORMAL LOW (ref 135–145)
Sodium: 128 mmol/L — ABNORMAL LOW (ref 135–145)

## 2018-02-13 LAB — CBC WITH DIFFERENTIAL/PLATELET
Abs Immature Granulocytes: 0.8 10*3/uL — ABNORMAL HIGH (ref 0.0–0.1)
BASOS PCT: 0 %
Basophils Absolute: 0.1 10*3/uL (ref 0.0–0.1)
EOS ABS: 0 10*3/uL (ref 0.0–0.7)
EOS PCT: 0 %
HEMATOCRIT: 39.2 % (ref 39.0–52.0)
Hemoglobin: 12.3 g/dL — ABNORMAL LOW (ref 13.0–17.0)
IMMATURE GRANULOCYTES: 4 %
Lymphocytes Relative: 5 %
Lymphs Abs: 0.9 10*3/uL (ref 0.7–4.0)
MCH: 28.9 pg (ref 26.0–34.0)
MCHC: 31.4 g/dL (ref 30.0–36.0)
MCV: 92.2 fL (ref 78.0–100.0)
Monocytes Absolute: 1.1 10*3/uL — ABNORMAL HIGH (ref 0.1–1.0)
Monocytes Relative: 6 %
NEUTROS PCT: 85 %
Neutro Abs: 16 10*3/uL — ABNORMAL HIGH (ref 1.7–7.7)
Platelets: 628 10*3/uL — ABNORMAL HIGH (ref 150–400)
RBC: 4.25 MIL/uL (ref 4.22–5.81)
RDW: 14.4 % (ref 11.5–15.5)
WBC: 18.8 10*3/uL — AB (ref 4.0–10.5)

## 2018-02-13 LAB — POCT I-STAT 3, ART BLOOD GAS (G3+)
ACID-BASE DEFICIT: 4 mmol/L — AB (ref 0.0–2.0)
Bicarbonate: 23.1 mmol/L (ref 20.0–28.0)
O2 SAT: 91 %
PO2 ART: 69 mmHg — AB (ref 83.0–108.0)
TCO2: 25 mmol/L (ref 22–32)
pCO2 arterial: 47.8 mmHg (ref 32.0–48.0)
pH, Arterial: 7.292 — ABNORMAL LOW (ref 7.350–7.450)

## 2018-02-13 LAB — SEDIMENTATION RATE: SED RATE: 88 mm/h — AB (ref 0–16)

## 2018-02-13 LAB — MAGNESIUM
Magnesium: 2.4 mg/dL (ref 1.7–2.4)
Magnesium: 2.5 mg/dL — ABNORMAL HIGH (ref 1.7–2.4)

## 2018-02-13 LAB — PHOSPHORUS
PHOSPHORUS: 5.1 mg/dL — AB (ref 2.5–4.6)
Phosphorus: 5.9 mg/dL — ABNORMAL HIGH (ref 2.5–4.6)

## 2018-02-13 LAB — PROCALCITONIN: Procalcitonin: 1.1 ng/mL

## 2018-02-13 MED ORDER — MIDAZOLAM HCL 2 MG/2ML IJ SOLN
1.0000 mg | INTRAMUSCULAR | Status: DC | PRN
Start: 2018-02-13 — End: 2018-02-17
  Administered 2018-02-13 (×3): 2 mg via INTRAVENOUS
  Administered 2018-02-15: 4 mg via INTRAVENOUS
  Administered 2018-02-15 – 2018-02-16 (×6): 2 mg via INTRAVENOUS
  Administered 2018-02-16: 4 mg via INTRAVENOUS
  Administered 2018-02-17 (×3): 2 mg via INTRAVENOUS
  Filled 2018-02-13: qty 4
  Filled 2018-02-13 (×4): qty 2
  Filled 2018-02-13: qty 4
  Filled 2018-02-13 (×8): qty 2

## 2018-02-13 MED ORDER — COLCHICINE 0.6 MG PO TABS
0.3000 mg | ORAL_TABLET | Freq: Every day | ORAL | Status: DC
  Administered 2018-02-14 – 2018-02-18 (×5): 0.3 mg via ORAL
  Filled 2018-02-13 (×5): qty 0.5

## 2018-02-13 MED ORDER — VITAL HIGH PROTEIN PO LIQD
1000.0000 mL | ORAL | Status: DC
  Administered 2018-02-13 – 2018-02-16 (×3): 1000 mL

## 2018-02-13 MED ORDER — PRO-STAT SUGAR FREE PO LIQD: 30.0000 mL | Freq: Two times a day (BID) | ORAL | Status: DC

## 2018-02-13 MED ORDER — LACTATED RINGERS IV BOLUS
500.0000 mL | Freq: Once | INTRAVENOUS | Status: AC
  Administered 2018-02-13: 500 mL via INTRAVENOUS

## 2018-02-13 MED ORDER — VANCOMYCIN HCL 10 G IV SOLR
1750.0000 mg | INTRAVENOUS | Status: AC
  Administered 2018-02-14 – 2018-02-16 (×2): 1750 mg via INTRAVENOUS
  Filled 2018-02-13 (×2): qty 1750

## 2018-02-13 MED ORDER — VITAL HIGH PROTEIN PO LIQD: 1000.0000 mL | ORAL | Status: DC

## 2018-02-13 NOTE — Progress Notes (Signed)
PULMONARY / CRITICAL CARE MEDICINE   Name: Wayne Booth MRN: [id number] DOB: [birth year]    ADMISSION DATE:  02/02/2018 CONSULTATION DATE: 8/17/201  CHIEF COMPLAINT: Respiratory extremis   brief      This is a 77 year old with a history of atrial fibrillation and diastolic dysfunction who presented 02/02/2018  with right-sided pleuritic chest pain fever and dyspnea.  He was treated with an empiric combination of azithromycin and Zosyn and at the same time was diuresed for diastolic failure.  Overnight last night 02/11/18  he has had increasing dyspnea.  He tells me that he has had midline chest pain is not clear whether or not this is been pleuritic.  There is no radiation.  He is currently not having any productive cough.  Patchy infiltrates on the CT scan of the chest obtained on 8/18 1 of them up appears consistent with a peripheral nodule.  He has a history of both prostate cancer and renal cancer and he tells me that both of these have undergone curative treatment.  PAST MEDICAL HISTORY :  He  has a past medical history of Allergy, Anxiety, Arthritis, Atrial fibrillation ([place]), Cancer ([place]), Coronary artery disease, History of total knee replacement, Hypertension, Hypothyroidism, Kidney cancer, primary, with metastasis from kidney to other site [facility]), Kidney stone, OSA (obstructive sleep apnea), Pituitary cyst ([place]), Pneumonia, and Prostate cancer ([place]).   reports that he has quit smoking. His smoking use included cigars. He quit after 4.00 years of use. He has never used smokeless tobacco.  PAST SURGICAL HISTORY: He  has a past surgical history that includes Appendectomy; Cholecystectomy; Nephrectomy (Left); Insertion prostate radiation seed; Replacement total knee bilateral; Knee arthroscopy (2007); Anterior cervical decomp/discectomy fusion (N/A, 08/29/2012); Craniotomy (N/A, 11/08/2012); Colonoscopy w/ polypectomy; Kidney stone surgery; Cardiac catheterization (2008); Posterior cervical  laminectomy (Left, 04/24/2015); Finger surgery (Left, 2017); Ablation of dysrhythmic focus (01/28/2018); and ATRIAL FIBRILLATION ABLATION (N/A, 01/28/2018).     SIGNIFICANT EVENTS:   EVENTS 02/02/2018 - admit 8/8 - CT CT with new findings of Bilateral LL consolidation and RML and RUL infilrrates compated to April 2018 8/17 - tx ICU and itnubation; Comycin and Zosyn started 8/17. Had new onset fever since admit with risinb WBC 20K.  cxr - stable iniflrtares PICC placed   SUBJECTIVE/OVERNIGHT/INTERVAL HX  8/18 - Needing 60% fio2./peep 5 - fio2 wore overnight. On fent gtt but oriented. Has trazazone prn , xanax prn and lexapro scheduled , pamelor schedule, and sovorexant for insomnia .  On levophed 54mg. Also on eliquis,  amio tab, cardiszem tab and lopressor table. Noted allopurinol and colchicine  S/p 2 doses solumedrol yesterday  Sons and daughter in law rerport - significant diuresis in medical floor with fluid restriction.  Low NA worse to 129. AKI worse 1.6 -> 2.6 . Also report dusky toes  VITAL SIGNS: BP (!) 94/58   Pulse 71   Temp (!) 97.3 F (36.3 C)   Resp 18   Ht '5\' 10"'  (1.778 m)   Wt 129.8 kg Comment: unable to stand due to shortness of breath  SpO2 91%   BMI 41.06 kg/m   HEMODYNAMICS:    VENTILATOR SETTINGS: Vent Mode: PRVC FiO2 (%):  [50 %-100 %] 60 % Set Rate:  [18 bmp] 18 bmp Vt Set:  [600 mL] 600 mL PEEP:  [5 cmH20] 5 cmH20 Plateau Pressure:  [22 cmH20-27 cmH20] 27 cmH20  INTAKE / OUTPUT: I/O last 3 completed shifts: In: 1672 [P.O.:240; I.V.:849.5; IV Piggyback:582.5] Out: 841[Urine:610; Emesis/NG output:200]  PHYSICAL EXAMINATION:   General Appearance:    Looks criticall ill OBESE - yes  Head:    Normocephalic, without obvious abnormality, atraumatic  Eyes:    PERRL - yes, conjunctiva/corneas - clear      Ears:    Normal external ear canals, both ears  Nose:   NG tube - no  Throat:  ETT TUBE - yes , OG tube - yes  Neck:   Supple,  No  enlargement/tenderness/nodules     Lungs:     Clear to auscultation bilaterally, Ventilator   Synchron - yes  Chest wall:    No deformity  Heart:    S1 and S2 normal, no murmur, CVP - no.  Pressors - levophed 64mg  Abdomen:     Soft, no masses, no organomegaly  Genitalia:    Not done  Rectal:   not done  Extremities:   Extremities- inact but toes are dusky and feelt cool (per RN doppler feet is good_     Skin:   Intact in exposed areas .     Neurologic:   Sedation - fent tgtt -> RASS - -2 . Moves all 4s - yes. CAM-ICU - neg . Orientation - x     PULMONARY Recent Labs  Lab 02/06/18 1142 02/12/18 1536 02/13/18 0337  PHART 7.444 7.332* 7.292*  PCO2ART 43.4 42.7 47.8  PO2ART 65.5* 249.0* 69.0*  HCO3 29.3* 22.6 23.1  TCO2  --  24 25  O2SAT 93.0 100.0 91.0    CBC Recent Labs  Lab 02/07/18 0320 02/08/18 0300 02/13/18 0512  HGB 12.4* 12.1* 12.3*  HCT 37.7* 37.7* 39.2  WBC 14.1* 14.1* 18.8*  PLT 384 419* 628*    COAGULATION No results for input(s): INR in the last 168 hours.  CARDIAC  No results for input(s): TROPONINI in the last 168 hours. No results for input(s): PROBNP in the last 168 hours.   CHEMISTRY Recent Labs  Lab 02/09/18 0326 02/10/18 0252 02/11/18 0326 02/12/18 0322 02/13/18 0512  NA 131* 132* 131* 130* 129*  K 3.5 4.0 4.5 4.4 5.1  CL 95* 95* 96* 93* 96*  CO2 '27 27 28 27 22  ' GLUCOSE 111* 117* 121* 144* 163*  BUN 20 23 24* 25* 42*  CREATININE 1.30* 1.43* 1.47* 1.60* 2.59*  CALCIUM 8.4* 8.5* 8.6* 8.7* 8.3*   Estimated Creatinine Clearance: 33.4 mL/min (A) (by C-G formula based on SCr of 2.59 mg/dL (H)).   LIVER No results for input(s): AST, ALT, ALKPHOS, BILITOT, PROT, ALBUMIN, INR in the last 168 hours.   INFECTIOUS Recent Labs  Lab 02/12/18 1332 02/12/18 1647 02/13/18 0512  LATICACIDVEN 2.9* 2.8*  --   PROCALCITON 0.43  --  1.10     ENDOCRINE CBG (last 3)  Recent Labs    02/12/18 2316 02/13/18 0305 02/13/18 0752  GLUCAP  180* 174* 135*         IMAGING x48h  - image(s) personally visualized  -   highlighted in bold Dg Chest 1 View  Result Date: 02/13/2018 CLINICAL DATA:  Respiratory abnormality. EXAM: CHEST  1 VIEW COMPARISON:  02/12/2018 FINDINGS: Endotracheal tube has tip 4.6 cm above the carina. Nasogastric tube courses into the region of the stomach as tip is not visualized. Interval placement of right-sided PICC line with tip just below the cavoatrial junction. Lungs are hypoinflated with stable hazy opacification over the left base likely due to small effusion with atelectasis. Mild prominence of the central perihilar markings which may be due to  mild degree of vascular congestion. Stable moderate cardiomegaly. Remainder of the exam is unchanged. IMPRESSION: Stable hazy left base opacification likely small effusion with atelectasis. Possible small right effusion. Stable cardiomegaly with suggestion of minimal vascular congestion. Tubes and lines as described. Electronically Signed   By: Marin Olp M.D.   On: 02/13/2018 09:38   Dg Chest Port 1 View  Result Date: 02/12/2018 CLINICAL DATA:  Encounter for endotracheal tube placement. EXAM: PORTABLE CHEST 1 VIEW COMPARISON:  02/12/2018 FINDINGS: Endotracheal tube is 2.6 cm above the carina. Nasogastric tube extends into the abdomen. Persistent densities at the left lung base. Hazy airspace densities in both lungs are again noted. Cardiac silhouette remains enlarged. IMPRESSION: Endotracheal tube is appropriately positioned above the carina. Nasogastric tube extends into the abdomen. Stable bilateral airspace densities and stable consolidation/pleural fluid at the left lung base. Electronically Signed   By: Markus Daft M.D.   On: 02/12/2018 13:37   Dg Chest Port 1 View  Result Date: 02/12/2018 CLINICAL DATA:  Hypoxemia. EXAM: PORTABLE CHEST 1 VIEW COMPARISON:  02/10/2018 prior radiographs FINDINGS: Cardiomegaly and bilateral airspace opacities again noted. LEFT  LOWER lung consolidation/atelectasis is unchanged. No pneumothorax identified. Probable small bilateral pleural effusions again noted. Little interval change since the prior study. IMPRESSION: Unchanged appearance of the chest with bilateral airspace opacities and LEFT LOWER lung consolidation/atelectasis Electronically Signed   By: Margarette Canada M.D.   On: 02/12/2018 11:41   [place] Ekg Site Rite  Result Date: 02/12/2018 If Site Rite image not attached, placement could not be confirmed due to current cardiac rhythm.    Results for orders placed or performed during the hospital encounter of 02/02/18  Culture, blood (routine x 2)     Status: None   Collection Time: 02/02/18  5:27 PM  Result Value Ref Range Status   Specimen Description BLOOD BLOOD LEFT FOREARM  Final   Special Requests   Final    BOTTLES DRAWN AEROBIC AND ANAEROBIC Blood Culture results may not be optimal due to an inadequate volume of blood received in culture bottles   Culture   Final    NO GROWTH 5 DAYS Performed at [facility], [place] [address], [place], [place] [address]    Report Status 02/07/2018 FINAL  Final  Culture, blood (routine x 2)     Status: None   Collection Time: 02/02/18  6:35 PM  Result Value Ref Range Status   Specimen Description BLOOD RIGHT HAND  Final   Special Requests   Final    BOTTLES DRAWN AEROBIC AND ANAEROBIC Blood Culture adequate volume   Culture   Final    NO GROWTH 5 DAYS Performed at [facility], [place] [address], [place], [place] [address]    Report Status 02/08/2018 FINAL  Final  MRSA PCR Screening     Status: None   Collection Time: 02/03/18  2:44 AM  Result Value Ref Range Status   MRSA by PCR NEGATIVE NEGATIVE Final    Comment:        The GeneXpert MRSA Assay (FDA approved for NASAL specimens only), is one component of a comprehensive MRSA colonization surveillance program. It is not intended to diagnose MRSA infection nor to guide or monitor treatment for MRSA  infections. Performed at [facility], [place] [address], [place], [place] [address]   Culture, sputum-assessment     Status: None   Collection Time: 02/03/18  5:08 PM  Result Value Ref Range Status  Specimen Description EXPECTORATED SPUTUM  Final   Special Requests NONE  Final   Sputum evaluation   Final    THIS SPECIMEN IS ACCEPTABLE FOR SPUTUM CULTURE Performed at [facility], [address], [place], [place] [address]    Report Status 02/03/2018 FINAL  Final  Culture, respiratory     Status: None   Collection Time: 02/03/18  5:08 PM  Result Value Ref Range Status   Specimen Description EXPECTORATED SPUTUM  Final   Special Requests NONE Reflexed from [id number]  Final   Gram Stain   Final    ABUNDANT WBC PRESENT, PREDOMINANTLY PMN MODERATE GRAM POSITIVE COCCI    Culture   Final    Consistent with normal respiratory flora. Performed at [facility], [place] [address] [place], [place] [address]    Report Status 02/06/2018 FINAL  Final  MRSA PCR Screening     Status: Abnormal   Collection Time: 02/12/18  2:29 PM  Result Value Ref Range Status   MRSA by PCR POSITIVE (A) NEGATIVE Final    Comment:        The GeneXpert MRSA Assay (FDA approved for NASAL specimens only), is one component of a comprehensive MRSA colonization surveillance program. It is not intended to diagnose MRSA infection nor to guide or monitor treatment for MRSA infections. RESULT CALLED TO, READ BACK BY AND VERIFIED WITH: DEE DEE @ 1825 ON 02/12/18 BY ROBINSON Z,.     Anti-infectives (From admission, onward)   Start     Dose/Rate Route Frequency Ordered Stop   02/13/18 2100  vancomycin (VANCOCIN) 1,500 mg in sodium chloride 0.9 % 500 mL IVPB     1,500 mg 250 mL/hr over 120 Minutes Intravenous Every 24 hours 02/12/18 2010     02/13/18 1300  vancomycin (VANCOCIN) 1,500 mg in sodium chloride 0.9 % 500 mL IVPB  Status:  Discontinued     1,500 mg 250 mL/hr over 120 Minutes Intravenous Every  24 hours 02/12/18 1234 02/12/18 2010   02/12/18 2015  vancomycin (VANCOCIN) 2,000 mg in sodium chloride 0.9 % 500 mL IVPB     2,000 mg 250 mL/hr over 120 Minutes Intravenous  Once 02/12/18 2010 02/12/18 2300   02/12/18 1800  piperacillin-tazobactam (ZOSYN) IVPB 3.375 g  Status:  Discontinued     3.375 g 12.5 mL/hr over 240 Minutes Intravenous Every 8 hours 02/12/18 1313 02/12/18 1315   02/12/18 1400  ceFEPIme (MAXIPIME) 1 g in sodium chloride 0.9 % 100 mL IVPB  Status:  Discontinued     1 g 200 mL/hr over 30 Minutes Intravenous Every 8 hours 02/12/18 1234 02/12/18 1313   02/12/18 1400  piperacillin-tazobactam (ZOSYN) IVPB 3.375 g     3.375 g 12.5 mL/hr over 240 Minutes Intravenous Every 8 hours 02/12/18 1315     02/12/18 1315  piperacillin-tazobactam (ZOSYN) IVPB 3.375 g  Status:  Discontinued     3.375 g 100 mL/hr over 30 Minutes Intravenous  Once 02/12/18 1313 02/12/18 1315   02/12/18 1300  vancomycin (VANCOCIN) 2,000 mg in sodium chloride 0.9 % 500 mL IVPB  Status:  Discontinued     2,000 mg 250 mL/hr over 120 Minutes Intravenous  Once 02/12/18 1234 02/12/18 2010   02/06/18 1100  azithromycin (ZITHROMAX) 500 mg in sodium chloride 0.9 % 250 mL IVPB  Status:  Discontinued     500 mg 250 mL/hr over 60 Minutes Intravenous Every 24 hours 02/06/18 1024 02/12/18 1218   02/03/18 2000  vancomycin (VANCOCIN)  1,500 mg in sodium chloride 0.9 % 500 mL IVPB  Status:  Discontinued     1,500 mg 250 mL/hr over 120 Minutes Intravenous Every 24 hours 02/02/18 1948 02/03/18 1122   02/03/18 0400  ceFEPIme (MAXIPIME) 1 g in sodium chloride 0.9 % 100 mL IVPB     1 g 200 mL/hr over 30 Minutes Intravenous Every 8 hours 02/03/18 0007 02/10/18 2254   02/02/18 1845  vancomycin (VANCOCIN) 2,500 mg in sodium chloride 0.9 % 500 mL IVPB     2,500 mg 250 mL/hr over 120 Minutes Intravenous STAT 02/02/18 1832 02/02/18 2154   02/02/18 1845  ceFEPIme (MAXIPIME) 2 g in sodium chloride 0.9 % 100 mL IVPB     2 g 200  mL/hr over 30 Minutes Intravenous  Once 02/02/18 1833 02/02/18 1916   02/02/18 1815  ceFEPIme (MAXIPIME) 1 g in sodium chloride 0.9 % 100 mL IVPB  Status:  Discontinued     1 g 200 mL/hr over 30 Minutes Intravenous  Once 02/02/18 1802 02/02/18 1833       LINES/TUBES:   DISCUSSION:      This is an obese gentleman with a history of sleep apnea, atrial fibrillation status post ablation, history of transsphenoidal resection, history of prostate cancer with seed placement, history of cancer of the kidney status post left nephrectomy and history of nephrolithiasis who presented on 8/8 with right sided pleuritic chest pain.  He also has a history of diastolic heart failure.  He was a CTA was obtained which showed patchy infiltrates on the right and no PE.  He was empirically treated with a combination of azithromycin and cefepime but today was acutely dyspneic.  ASSESSMENT / PLAN:  PULMONARY A: Acute resp failure - admitted 02/02/2018 but intubated 02/12/18 for fever and resp distress; suspect HAP   02/13/2018 - does not meet extubation critera  Plan  full vent support   CARDIOVASCULAR A: ADmitted 02/02/2018 with pneumonia and also diast chf per family   on 02/12/18 intubated and since then has circulatory shock with levophed need, cool feet though pulses +, NSR with 80but on cardizem tab, lopressor tab and amio tab  Plan - dc cardizem Dc lopressor - MAP goal > 65 with levophed  - continue amio but check ESR for toxicity  RENAL A: He has a solitary kidney, chronic renal insufficiency appears to be stable at 1.6 creatinine  - creat worse; family concerned Na low. Hx of fluid rstriction and diuress 02/02/2018 through 8/17  Plan 500cc LR Bolus and recheck bmet 02/13/2018    GASTROINTESTINAL A: GI prophylaxis has been ordered with Pepcid  Plan  - start TF  HEMATOLOGIC  A: Chronically on Eliquis  (Estimated Creatinine Clearance: 33.4 mL/min (A) (by C-G formula based on SCr of 2.59  mg/dL (H)). )  Recent Labs  Lab 02/09/18 0326 02/10/18 0252 02/11/18 0326 02/12/18 0322 02/13/18 0512  CREATININE 1.30* 1.43* 1.47* 1.60* 2.59*      P - hold Eqliuis - decide PM dose based on 3pm bmet    INFECTIOUS A: Possible and likely HAP 02/12/18.  Plan  - vanc/zosyn - check trach aspirate and urineculture  ENDOCRINE A: Has a history of transsphenoidal resection and is currently on steroids and thyroid replacement  P  - ssi  NEUROLOGIC A:  Baseline  -Has trazazone prn , xanax prn and lexapro scheduled , pamelor schedule, and sovorexant for insomnia .  Plan  - continue pamelol and lexapro for now  - continue fent  gtt  - dc xanax - dx traazodone - dc sovorrexant  - do versed prn   MSK A gout   8/18 - not active  Pl - dc allopurinil - reduce colchicine dose du eto AKI    FAMILY 8/18 -updated

## 2018-02-13 NOTE — Progress Notes (Signed)
Pharmacy Antibiotic Note  Wayne Booth is a 77 y.o. male admitted on 02/02/2018. Treated for PNA but now worsening and worrisome for continued pneumonia.  Pharmacy has been consulted to resume abx, for vancomycin and zosyn dosing. SCr trend up further today 1.6>2.59, normalized CrCl~25.  Note - vancomycin load not given until ~2100 last night.  Plan: Continue Zosyn 3.375g IV q8h (4h inf) for - borderline for dosage adjustment, will watch SCr trend Adjust vancomycin to 1750mg  IV Q48h for worsening renal function Monitor clinical picture, renal function, VT at steady state F/U C&S, abx deescalation / LOT   Height: 5\' 10"  (177.8 cm) Weight: 286 lb 2.5 oz (129.8 kg)(unable to stand due to shortness of breath) IBW/kg (Calculated) : 73  Temp (24hrs), Avg:98.5 F (36.9 C), Min:97.2 F (36.2 C), Max:100.6 F (38.1 C)  Recent Labs  Lab 02/07/18 0320 02/08/18 0300  02/09/18 0326 02/10/18 0252 02/11/18 0326 02/12/18 0322 02/12/18 1332 02/12/18 1647 02/13/18 0512  WBC 14.1* 14.1*  --   --   --   --   --   --   --  18.8*  CREATININE 1.48*  --    < > 1.30* 1.43* 1.47* 1.60*  --   --  2.59*  LATICACIDVEN  --   --   --   --   --   --   --  2.9* 2.8*  --    < > = values in this interval not displayed.    Estimated Creatinine Clearance: 33.4 mL/min (A) (by C-G formula based on SCr of 2.59 mg/dL (H)).    Allergies  Allergen Reactions  . Ambien [Zolpidem] Anxiety    Anxiety and agitation when tried as sleeper in hospital aug 2019    Elicia Lamp, PharmD, BCPS Clinical Pharmacist Clinical phone [phone] Please check AMION for all [place] contact numbers 02/13/2018 11:45 AM

## 2018-02-13 NOTE — Progress Notes (Addendum)
Brief Nutrition Note  Consult received for enteral/tube feeding initiation and management.  Adult Enteral Nutrition Protocol initiated. Full assessment to follow.  Addendum 1420: RD received phone call from RN stating MD wants patient to receive trickle feeds. Will adjust order accordingly.  Admitting Dx: Pericardial effusion [I31.3] Pneumonia of right middle lobe due to infectious organism [facility]) [J18.1] Chest pain, unspecified type [R07.9] Acute respiratory failure with hypoxia ([place]) [J96.01]  Body mass index is 41.06 kg/m. Pt meets criteria for obesity based on current BMI.  Labs:  Recent Labs  Lab 02/11/18 0326 02/12/18 0322 02/13/18 0512  NA 131* 130* 129*  K 4.5 4.4 5.1  CL 96* 93* 96*  CO2 28 27 22   BUN 24* 25* 42*  CREATININE 1.47* 1.60* 2.59*  CALCIUM 8.6* 8.7* 8.3*  GLUCOSE 121* 144* 163*    Clayton Bibles, MS, RD, LDN [facility] Clinical Dietitian Pager: [phone] After Hours Pager: [phone]

## 2018-02-14 ENCOUNTER — Inpatient Hospital Stay (HOSPITAL_COMMUNITY): Payer: Medicare Other

## 2018-02-14 DIAGNOSIS — K5900 Constipation, unspecified: Secondary | ICD-10-CM

## 2018-02-14 LAB — GLUCOSE, CAPILLARY
GLUCOSE-CAPILLARY: 144 mg/dL — AB (ref 70–99)
GLUCOSE-CAPILLARY: 126 mg/dL — AB (ref 70–99)
GLUCOSE-CAPILLARY: 112 mg/dL — AB (ref 70–99)
Glucose-Capillary: 120 mg/dL — ABNORMAL HIGH (ref 70–99)
Glucose-Capillary: 130 mg/dL — ABNORMAL HIGH (ref 70–99)
Glucose-Capillary: 146 mg/dL — ABNORMAL HIGH (ref 70–99)

## 2018-02-14 LAB — HEPATIC FUNCTION PANEL
ALK PHOS: 84 U/L (ref 38–126)
ALT: 294 U/L — ABNORMAL HIGH (ref 0–44)
AST: 147 U/L — ABNORMAL HIGH (ref 15–41)
Albumin: 2.1 g/dL — ABNORMAL LOW (ref 3.5–5.0)
Bilirubin, Direct: 0.5 mg/dL — ABNORMAL HIGH (ref 0.0–0.2)
Indirect Bilirubin: 0.6 mg/dL (ref 0.3–0.9)
Total Bilirubin: 1.1 mg/dL (ref 0.3–1.2)
Total Protein: 6.6 g/dL (ref 6.5–8.1)

## 2018-02-14 LAB — CBC WITH DIFFERENTIAL/PLATELET
Abs Immature Granulocytes: 0.6 10*3/uL — ABNORMAL HIGH (ref 0.0–0.1)
BASOS PCT: 0 %
Basophils Absolute: 0 10*3/uL (ref 0.0–0.1)
EOS ABS: 0 10*3/uL (ref 0.0–0.7)
EOS PCT: 0 %
HCT: 37.2 % — ABNORMAL LOW (ref 39.0–52.0)
Hemoglobin: 11.9 g/dL — ABNORMAL LOW (ref 13.0–17.0)
Immature Granulocytes: 2 %
Lymphocytes Relative: 3 %
Lymphs Abs: 0.7 10*3/uL (ref 0.7–4.0)
MCH: 29.2 pg (ref 26.0–34.0)
MCHC: 32 g/dL (ref 30.0–36.0)
MCV: 91.4 fL (ref 78.0–100.0)
MONO ABS: 1.4 10*3/uL — AB (ref 0.1–1.0)
MONOS PCT: 5 %
NEUTROS PCT: 90 %
Neutro Abs: 22.4 10*3/uL — ABNORMAL HIGH (ref 1.7–7.7)
PLATELETS: 624 10*3/uL — AB (ref 150–400)
RBC: 4.07 MIL/uL — ABNORMAL LOW (ref 4.22–5.81)
RDW: 14.3 % (ref 11.5–15.5)
WBC: 25 10*3/uL — ABNORMAL HIGH (ref 4.0–10.5)

## 2018-02-14 LAB — PROTIME-INR
INR: 2.04
PROTHROMBIN TIME: 22.8 s — AB (ref 11.4–15.2)

## 2018-02-14 LAB — BASIC METABOLIC PANEL
Anion gap: 10 (ref 5–15)
BUN: 52 mg/dL — AB (ref 8–23)
CALCIUM: 8.3 mg/dL — AB (ref 8.9–10.3)
CO2: 25 mmol/L (ref 22–32)
Chloride: 96 mmol/L — ABNORMAL LOW (ref 98–111)
Creatinine, Ser: 2.47 mg/dL — ABNORMAL HIGH (ref 0.61–1.24)
GFR calc Af Amer: 28 mL/min — ABNORMAL LOW (ref >60)
GFR, EST NON AFRICAN AMERICAN: 24 mL/min — AB (ref >60)
Glucose, Bld: 152 mg/dL — ABNORMAL HIGH (ref 70–99)
Potassium: 4.7 mmol/L (ref 3.5–5.1)
Sodium: 131 mmol/L — ABNORMAL LOW (ref 135–145)

## 2018-02-14 LAB — MAGNESIUM
MAGNESIUM: 2.9 mg/dL — AB (ref 1.7–2.4)
Magnesium: 2.7 mg/dL — ABNORMAL HIGH (ref 1.7–2.4)

## 2018-02-14 LAB — PHOSPHORUS
PHOSPHORUS: 4.2 mg/dL (ref 2.5–4.6)
Phosphorus: 4.5 mg/dL (ref 2.5–4.6)

## 2018-02-14 LAB — URINE CULTURE: CULTURE: NO GROWTH

## 2018-02-14 LAB — PROCALCITONIN: Procalcitonin: 0.69 ng/mL

## 2018-02-14 LAB — CK TOTAL AND CKMB (NOT AT ARMC)
CK, MB: 8 ng/mL — ABNORMAL HIGH (ref 0.5–5.0)
RELATIVE INDEX: 3.3 — AB (ref 0.0–2.5)
Total CK: 240 U/L (ref 49–397)

## 2018-02-14 LAB — HEPARIN LEVEL (UNFRACTIONATED): Heparin Unfractionated: >2.2 [IU]/mL — ABNORMAL HIGH (ref 0.30–0.70)

## 2018-02-14 LAB — APTT: APTT: 40 s — AB (ref 24–36)

## 2018-02-14 LAB — LACTIC ACID, PLASMA: Lactic Acid, Venous: 1.2 mmol/L (ref 0.5–1.9)

## 2018-02-14 LAB — BRAIN NATRIURETIC PEPTIDE: B Natriuretic Peptide: 145 pg/mL — ABNORMAL HIGH (ref 0.0–100.0)

## 2018-02-14 MED ORDER — HEPARIN (PORCINE) IN NACL 100-0.45 UNIT/ML-% IJ SOLN
2400.0000 [IU]/h | INTRAMUSCULAR | Status: DC
  Administered 2018-02-14: 1500 [IU]/h via INTRAVENOUS
  Administered 2018-02-15: 2050 [IU]/h via INTRAVENOUS
  Administered 2018-02-15: 1500 [IU]/h via INTRAVENOUS
  Administered 2018-02-16: 2300 [IU]/h via INTRAVENOUS
  Administered 2018-02-16: 2250 [IU]/h via INTRAVENOUS
  Administered 2018-02-17: 2300 [IU]/h via INTRAVENOUS
  Administered 2018-02-17 – 2018-02-19 (×3): 2400 [IU]/h via INTRAVENOUS
  Filled 2018-02-14 (×9): qty 250

## 2018-02-14 MED ORDER — DEXMEDETOMIDINE HCL IN NACL 400 MCG/100ML IV SOLN
0.4000 ug/kg/h | INTRAVENOUS | Status: DC
  Administered 2018-02-14 (×2): 0.4 ug/kg/h via INTRAVENOUS
  Administered 2018-02-15: 1.1 ug/kg/h via INTRAVENOUS
  Administered 2018-02-15: 0.5 ug/kg/h via INTRAVENOUS
  Administered 2018-02-15: 0.4 ug/kg/h via INTRAVENOUS
  Administered 2018-02-15: 1 ug/kg/h via INTRAVENOUS
  Administered 2018-02-16: 1.2 ug/kg/h via INTRAVENOUS
  Administered 2018-02-16: 1.1 ug/kg/h via INTRAVENOUS
  Administered 2018-02-16 (×2): 1.2 ug/kg/h via INTRAVENOUS
  Administered 2018-02-16: 1.1 ug/kg/h via INTRAVENOUS
  Administered 2018-02-16 (×3): 1.2 ug/kg/h via INTRAVENOUS
  Administered 2018-02-16: 1 ug/kg/h via INTRAVENOUS
  Administered 2018-02-17 (×2): 1.2 ug/kg/h via INTRAVENOUS
  Administered 2018-02-17: 1.4 ug/kg/h via INTRAVENOUS
  Administered 2018-02-17 (×2): 1.2 ug/kg/h via INTRAVENOUS
  Administered 2018-02-17 (×2): 1.4 ug/kg/h via INTRAVENOUS
  Administered 2018-02-17: 1.2 ug/kg/h via INTRAVENOUS
  Administered 2018-02-18: 1.4 ug/kg/h via INTRAVENOUS
  Administered 2018-02-18: 1.6 ug/kg/h via INTRAVENOUS
  Administered 2018-02-18: 1.7 ug/kg/h via INTRAVENOUS
  Administered 2018-02-18: 1.5 ug/kg/h via INTRAVENOUS
  Filled 2018-02-14 (×30): qty 100

## 2018-02-14 MED ORDER — METOLAZONE 5 MG PO TABS
5.0000 mg | ORAL_TABLET | Freq: Every day | ORAL | Status: AC
  Administered 2018-02-14: 5 mg via ORAL
  Filled 2018-02-14: qty 1

## 2018-02-14 MED ORDER — MAGNESIUM HYDROXIDE 400 MG/5ML PO SUSP
15.0000 mL | Freq: Every day | ORAL | Status: DC
  Administered 2018-02-14 – 2018-02-16 (×3): 15 mL
  Filled 2018-02-14 (×2): qty 30

## 2018-02-14 MED ORDER — DOCUSATE SODIUM 50 MG/5ML PO LIQD
100.0000 mg | Freq: Every day | ORAL | Status: DC
  Administered 2018-02-14 – 2018-02-21 (×7): 100 mg
  Filled 2018-02-14 (×9): qty 10

## 2018-02-14 MED ORDER — FUROSEMIDE 10 MG/ML IJ SOLN
5.0000 mg/h | INTRAVENOUS | Status: AC
  Administered 2018-02-14: 5 mg/h via INTRAVENOUS
  Filled 2018-02-14: qty 21

## 2018-02-14 MED ORDER — PRO-STAT SUGAR FREE PO LIQD
60.0000 mL | Freq: Two times a day (BID) | ORAL | Status: DC
  Administered 2018-02-14 – 2018-02-20 (×13): 60 mL
  Filled 2018-02-14 (×13): qty 60

## 2018-02-14 MED ORDER — FLEET ENEMA 7-19 GM/118ML RE ENEM
1.0000 | ENEMA | Freq: Once | RECTAL | Status: DC
  Filled 2018-02-14 (×2): qty 1

## 2018-02-14 MED ORDER — HEPARIN BOLUS VIA INFUSION
5000.0000 [IU] | Freq: Once | INTRAVENOUS | Status: AC
  Administered 2018-02-14: 5000 [IU] via INTRAVENOUS
  Filled 2018-02-14: qty 5000

## 2018-02-14 NOTE — Progress Notes (Addendum)
Initial Nutrition Assessment  DOCUMENTATION CODES:   Morbid obesity  INTERVENTION:   When abdominal distention improved, recommend advance TF to goal rate:  Vital High Protein at 60 ml/h (1440 ml per day)  Pro-stat 60 ml BID  Provides 1840 kcal, 186 gm protein, 1204 ml free water daily  NUTRITION DIAGNOSIS:   Inadequate oral intake related to inability to eat as evidenced by NPO status.  GOAL:   Provide needs based on ASPEN/SCCM guidelines  MONITOR:   Vent status, TF tolerance, Weight trends, Labs, I & O's  REASON FOR ASSESSMENT:   Consult Enteral/tube feeding initiation and management  ASSESSMENT:   77 yo male with PMH of HTN, arthritis, hypothyroidism, OSA, prostate cancer, renal cancer, CAD, and A fib who was admitted on 8/7 with chest pain, fever, dyspnea, bilateral LL consolidation and RML & RUL infiltrates. Required intubation on 8/17.  Patient is currently intubated on ventilator support MV: 12.5 L/min Temp (24hrs), Avg:97.3 F (36.3 C), Min:97 F (36.1 C), Max:98.7 F (37.1 C)  Propofol: none  Currently receiving Vital High Protein at 20 ml/h via OGT, tolerating well. Abdomen taut this morning per RN. Plans for enema. No BM since admission.   Labs reviewed. Sodium 131 (L), Magnesium 2.7 (H) CBG's: 146-126 Medications reviewed and include Solu-medrol, MVI, and Levophed.  Wife reports that patient was eating very well at home PTA. He had recently lost a little weight due to fluid loss from decreasing sodium intake.   NUTRITION - FOCUSED PHYSICAL EXAM:    Most Recent Value  Orbital Region  No depletion  Upper Arm Region  No depletion  Thoracic and Lumbar Region  Unable to assess  Buccal Region  Unable to assess  Temple Region  No depletion  Clavicle Bone Region  No depletion  Clavicle and Acromion Bone Region  No depletion  Scapular Bone Region  Unable to assess  Dorsal Hand  Unable to assess  Patellar Region  No depletion  Anterior Thigh Region   No depletion  Posterior Calf Region  No depletion  Edema (RD Assessment)  Mild  Hair  Reviewed  Eyes  Unable to assess  Mouth  Unable to assess  Skin  Reviewed  Nails  Unable to assess       Diet Order:   Diet Order    None      EDUCATION NEEDS:   No education needs have been identified at this time  Skin:  Skin Assessment: Reviewed RN Assessment  Last BM:  8/16  Height:   Ht Readings from Last 1 Encounters:  02/12/18 5\' 10"  (1.778 m)    Weight:   Wt Readings from Last 1 Encounters:  02/14/18 134.5 kg    Ideal Body Weight:  75.5 kg  BMI:  Body mass index is 42.55 kg/m.  Estimated Nutritional Needs:   Kcal:  1480-1880  Protein:  189 gm  Fluid:  2 L    Molli Barrows, RD, LDN, [place] Pager [phone] After Hours Pager [phone]

## 2018-02-14 NOTE — Progress Notes (Signed)
CSW aware that pt was to discharge to [facility] at the time of discharge. CSW aware pt is now intubated and on vent at 60 percent. CSW will continue to follow for further discharge needs once medically stable and support for family and pt at this time.    Wayne Booth, MSW, [facility] Clinical Social Worker [phone]

## 2018-02-14 NOTE — Progress Notes (Signed)
0500 sputum collection order not collected. No sputum/secretions noted from patient at this time. Pt tolerating suctioning well. No complications noted.

## 2018-02-14 NOTE — Progress Notes (Addendum)
ANTICOAGULATION CONSULT NOTE - Initial Consult  Pharmacy Consult for heparin  Indication: atrial fibrillation  Allergies  Allergen Reactions  . Ambien [Zolpidem] Anxiety    Anxiety and agitation when tried as sleeper in hospital aug 2019    Patient Measurements: Height: 5\' 10"  (177.8 cm) Weight: 296 lb 8.3 oz (134.5 kg) IBW/kg (Calculated) : 73 Heparin Dosing Weight: 106 Vital Signs: Temp: 98.1 F (36.7 C) (08/19 0834) Temp Source: Oral (08/19 0834) BP: 133/66 (08/19 1100) Pulse Rate: 85 (08/19 1132)  Labs: Recent Labs    02/13/18 0512 02/13/18 1247 02/13/18 1700 02/14/18 0010 02/14/18 0555  HGB 12.3*  --   --   --  11.9*  HCT 39.2  --   --   --  37.2*  PLT 628*  --   --   --  624*  LABPROT  --   --   --   --  22.8*  INR  --   --   --   --  2.04  CREATININE 2.59* 2.44* 2.54* 2.47*  --   CKTOTAL  --   --   --   --  240  CKMB  --   --   --   --  8.0*    Estimated Creatinine Clearance: 35.7 mL/min (A) (by C-G formula based on SCr of 2.47 mg/dL (H)).   Medical History: Past Medical History:  Diagnosis Date  . Allergy    States his nose runs when he is around grease.   Marland Kitchen Anxiety   . Arthritis   . Atrial fibrillation ([place])   . Cancer ([place])   . Coronary artery disease   . History of total knee replacement   . Hypertension   . Hypothyroidism   . Kidney cancer, primary, with metastasis from kidney to other site [facility])   . Kidney stone   . OSA (obstructive sleep apnea)    pt does not wear cpap at night  . Pituitary cyst ([place])   . Pneumonia    hx of  . Prostate cancer [facility])     Medications:   Assessment: 77 yo male with admitted for pneumonia, on apixaban PTA for Afib and history of PE. Holding apixaban and starting heparin for anticoagulation. H&H 11.9/37.2  plts 624. No bleeding documented. Will use APTT to guide dosing as apixaban will affect heparin levels until the two labs correlate. Last apixaban dose was 8/18 ~1030.  Goal of Therapy:  APTT Goal  66-102 Heparin level 0.3-0.7 units/ml Monitor platelets by anticoagulation protocol: Yes   Plan:  Heparin bolus 5000 units x1 Heparin gtt at 1500 units 6hr APTT/Heparin level Daily CBC, heparin level, APTT  Monitor for signs and symptoms of bleeding.    Harrietta Guardian, PharmD PGY1 Pharmacy Resident Direct Phone [phone] 02/14/2018    1:13 PM

## 2018-02-14 NOTE — Progress Notes (Signed)
PULMONARY / CRITICAL CARE MEDICINE   Name: Wayne Booth MRN: [id number] DOB: January 14, 1941    ADMISSION DATE:  02/02/2018 CONSULTATION DATE: 8/17/201  CHIEF COMPLAINT: Respiratory extremis   Brief      This is a 77 year old with a history of atrial fibrillation and diastolic dysfunction who presented 02/02/2018  with right-sided pleuritic chest pain fever and dyspnea.  He was treated with an empiric combination of azithromycin and Zosyn and at the same time was diuresed for diastolic failure.  Overnight last night 02/11/18  he has had increasing dyspnea.  He tells me that he has had midline chest pain is not clear whether or not this is been pleuritic.  There is no radiation.  He is currently not having any productive cough.  Patchy infiltrates on the CT scan of the chest obtained on 8/18 1 of them up appears consistent with a peripheral nodule.  He has a history of both prostate cancer and renal cancer and he tells me that both of these have undergone curative treatment.  SIGNIFICANT EVENTS:   EVENTS 02/02/2018 - admit 8/8 - CT CT with new findings of Bilateral LL consolidation and RML and RUL infilrrates compated to April 2018 8/17 - tx ICU and itnubation; Vancomycin and Zosyn started 8/17. Had new onset fever since admit with rising WBC 20K.  cxr - stable iniflrtares PICC placed.   SUBJECTIVE/OVERNIGHT/INTERVAL HX  8/18 - Needing 60% fio2./peep 5 - fio2 wore overnight. On fent gtt but oriented. Has trazazone prn , xanax prn and lexapro scheduled , pamelor schedule, and sovorexant for insomnia .  On levophed 80mcg. Also on eliquis,  amio tab, cardiszem tab and lopressor table. Noted allopurinol and colchicine  S/p 2 doses solumedrol yesterday  Sons and daughter in law rerport - significant diuresis in medical floor with fluid restriction.  Low NA worse to 129. AKI worse 1.6 -> 2.6 . Also report dusky toes  VITAL SIGNS: BP 133/66   Pulse 79   Temp 98.1 F (36.7 C) (Oral)   Resp (!) 21    Ht 5\' 10"  (1.778 m)   Wt 134.5 kg   SpO2 91%   BMI 42.55 kg/m   HEMODYNAMICS:    VENTILATOR SETTINGS: Vent Mode: PRVC FiO2 (%):  [50 %-60 %] 50 % Set Rate:  [18 bmp] 18 bmp Vt Set:  [600 mL] 600 mL PEEP:  [8 cmH20] 8 cmH20 Plateau Pressure:  [27 cmH20-32 cmH20] 27 cmH20  INTAKE / OUTPUT: I/O last 3 completed shifts: In: 3410.2 [I.V.:1185.1; NG/GT:160; IV Piggyback:2065.2] Out: 2220 [Urine:1920; Emesis/NG output:300]  PHYSICAL EXAMINATION: General: Acutely ill appearing male, NAD HEENT: [place]/AT, PERRL, EOM-I and MMM Hrt: RRR, Nl S1/S2 and -M/R/G Lung: Coarse BS diffusely Abd: Soft, NT, ND and +BS Ext: 1+ edema, dependent edema in the patient's side Skin: Intact  PULMONARY Recent Labs  Lab 02/12/18 1536 02/13/18 0337  PHART 7.332* 7.292*  PCO2ART 42.7 47.8  PO2ART 249.0* 69.0*  HCO3 22.6 23.1  TCO2 24 25  O2SAT 100.0 91.0   CBC Recent Labs  Lab 02/08/18 0300 02/13/18 0512 02/14/18 0555  HGB 12.1* 12.3* 11.9*  HCT 37.7* 39.2 37.2*  WBC 14.1* 18.8* 25.0*  PLT 419* 628* 624*   COAGULATION Recent Labs  Lab 02/14/18 0555  INR 2.04   CARDIAC  No results for input(s): TROPONINI in the last 168 hours. No results for input(s): PROBNP in the last 168 hours.  CHEMISTRY Recent Labs  Lab 02/12/18 0322 02/13/18 0512 02/13/18 1247 02/13/18 1700 02/14/18  0010 02/14/18 0555  NA 130* 129* 129* 128* 131*  --   K 4.4 5.1 4.7 4.8 4.7  --   CL 93* 96* 97* 97* 96*  --   CO2 27 22 24 23 25   --   GLUCOSE 144* 163* 183* 173* 152*  --   BUN 25* 42* 43* 46* 52*  --   CREATININE 1.60* 2.59* 2.44* 2.54* 2.47*  --   CALCIUM 8.7* 8.3* 7.8* 7.6* 8.3*  --   MG  --  2.4  --  2.5*  --  2.7*  PHOS  --  5.9*  --  5.1*  --  4.5   Estimated Creatinine Clearance: 35.7 mL/min (A) (by C-G formula based on SCr of 2.47 mg/dL (H)).  LIVER Recent Labs  Lab 02/14/18 0555  AST 147*  ALT 294*  ALKPHOS 84  BILITOT 1.1  PROT 6.6  ALBUMIN 2.1*  INR 2.04   INFECTIOUS Recent  Labs  Lab 02/12/18 1332 02/12/18 1647 02/13/18 0512 02/14/18 0001 02/14/18 0555  LATICACIDVEN 2.9* 2.8*  --  1.2  --   PROCALCITON 0.43  --  1.10  --  0.69   ENDOCRINE CBG (last 3)  Recent Labs    02/13/18 2021 02/14/18 0013 02/14/18 0830  GLUCAP 142* 146* 126*   IMAGING x48h  - image(s) personally visualized  -   highlighted in bold Dg Chest 1 View  Result Date: 02/13/2018 CLINICAL DATA:  Respiratory abnormality. EXAM: CHEST  1 VIEW COMPARISON:  02/12/2018 FINDINGS: Endotracheal tube has tip 4.6 cm above the carina. Nasogastric tube courses into the region of the stomach as tip is not visualized. Interval placement of right-sided PICC line with tip just below the cavoatrial junction. Lungs are hypoinflated with stable hazy opacification over the left base likely due to small effusion with atelectasis. Mild prominence of the central perihilar markings which may be due to mild degree of vascular congestion. Stable moderate cardiomegaly. Remainder of the exam is unchanged. IMPRESSION: Stable hazy left base opacification likely small effusion with atelectasis. Possible small right effusion. Stable cardiomegaly with suggestion of minimal vascular congestion. Tubes and lines as described. Electronically Signed   By: Marin Olp M.D.   On: 02/13/2018 09:38   Dg Chest Port 1 View  Result Date: 02/14/2018 CLINICAL DATA:  Encounter for weaning from ventilator. EXAM: PORTABLE CHEST 1 VIEW COMPARISON:  Radiograph of February 13, 2018. FINDINGS: Endotracheal and nasogastric tubes are unchanged in position. Right-sided PICC line is noted. Stable cardiomegaly with central pulmonary vascular congestion is noted. Stable bibasilar atelectasis or edema is noted with associated pleural effusions. No pneumothorax is noted. Bony thorax is unremarkable. IMPRESSION: Stable support apparatus. Stable bibasilar atelectasis or edema is noted with associated pleural effusions. Electronically Signed   By: Marijo Conception, M.D.   On: 02/14/2018 07:29   Dg Chest Port 1 View  Result Date: 02/12/2018 CLINICAL DATA:  Encounter for endotracheal tube placement. EXAM: PORTABLE CHEST 1 VIEW COMPARISON:  02/12/2018 FINDINGS: Endotracheal tube is 2.6 cm above the carina. Nasogastric tube extends into the abdomen. Persistent densities at the left lung base. Hazy airspace densities in both lungs are again noted. Cardiac silhouette remains enlarged. IMPRESSION: Endotracheal tube is appropriately positioned above the carina. Nasogastric tube extends into the abdomen. Stable bilateral airspace densities and stable consolidation/pleural fluid at the left lung base. Electronically Signed   By: Markus Daft M.D.   On: 02/12/2018 13:37   Dg Chest Port 1 View  Result Date: 02/12/2018  CLINICAL DATA:  Hypoxemia. EXAM: PORTABLE CHEST 1 VIEW COMPARISON:  02/10/2018 prior radiographs FINDINGS: Cardiomegaly and bilateral airspace opacities again noted. LEFT LOWER lung consolidation/atelectasis is unchanged. No pneumothorax identified. Probable small bilateral pleural effusions again noted. Little interval change since the prior study. IMPRESSION: Unchanged appearance of the chest with bilateral airspace opacities and LEFT LOWER lung consolidation/atelectasis Electronically Signed   By: Margarette Canada M.D.   On: 02/12/2018 11:41   [place] Ekg Site Rite  Result Date: 02/12/2018 If Site Rite image not attached, placement could not be confirmed due to current cardiac rhythm.  LINES/TUBES: ETT 8/17>>>  DISCUSSION:      This is an obese gentleman with a history of sleep apnea, atrial fibrillation status post ablation, history of transsphenoidal resection, history of prostate cancer with seed placement, history of cancer of the kidney status post left nephrectomy and history of nephrolithiasis who presented on 8/8 with right sided pleuritic chest pain.  He also has a history of diastolic heart failure.  He was a CTA was obtained which showed patchy  infiltrates on the right and no PE.  He was empirically treated with a combination of azithromycin and cefepime but today was acutely dyspneic.  ASSESSMENT / PLAN:  PULMONARY A: Acute resp failure - admitted 02/02/2018 but intubated 02/12/18 for fever and resp distress; suspect HAP  Plan Begin PS trials but no extubation since patient remains fluid overloaded Titrate O2 for sat of 88-92% Full vent support at night  CARDIOVASCULAR A: ADmitted 02/02/2018 with pneumonia and also diast chf per family  On 02/12/18 intubated and since then has circulatory shock with levophed need, cool feet though pulses +, NSR with 80but on cardizem tab, lopressor tab and amio tab  Plan Continue to hold cardizem Hold lopressor Levophed for BP support Tele monitoring If HR increases will consider amiodarone Heparin drip  RENAL A: He has a solitary kidney, chronic renal insufficiency appears to be stable at 1.6 creatinine Plan KVO IVF Check CVP Lasix drip at 5 mg/hr Zaroxolyn 5 mg PO x1 BMET in AM Replace electrolytes as indicated  GASTROINTESTINAL A: GI prophylaxis has been ordered with Pepcid  Plan Restart TF Fleet enema x1 Colace BID  MOM  HEMATOLOGIC  A: Chronically on Eliquis  (Estimated Creatinine Clearance: 35.7 mL/min (A) (by C-G formula based on SCr of 2.47 mg/dL (H)). )  Recent Labs  Lab 02/12/18 0322 02/13/18 0512 02/13/18 1247 02/13/18 1700 02/14/18 0010  CREATININE 1.60* 2.59* 2.44* 2.54* 2.47*  P - Heparin drip - Eliquis once extubated and taking PO  INFECTIOUS A: Possible and likely HAP 02/12/18.  Plan Vanc/zosyn Resp culture speciation pending  ENDOCRINE A: Has a history of transsphenoidal resection and is currently on steroids and thyroid replacement  P ISS CBGs  NEUROLOGIC A:  Baseline  -Has trazazone prn , xanax prn and lexapro scheduled , pamelor schedule, and sovorexant for insomnia .  Plan Continue pamelol and lexapro for now Continue fent  gtt Hold xanax Hold traazodone Hold sovorrexant Versed prn Precedex drip  MSK A gout Plan Hold allopurinil Hold colchicine  FAMILY 8/19 -updated  The patient is critically ill with multiple organ systems failure and requires high complexity decision making for assessment and support, frequent evaluation and titration of therapies, application of advanced monitoring technologies and extensive interpretation of multiple databases.   Critical Care Time devoted to patient care services described in this note is  37  Minutes. This time reflects time of care of this signee Dr Michaelene Song  Nelda Marseille. This critical care time does not reflect procedure time, or teaching time or supervisory time of PA/NP/Med student/Med Resident etc but could involve care discussion time.  Rush Farmer, M.D. [facility] Pulmonary/Critical Care Medicine. Pager: [phone]. After hours pager: [phone].

## 2018-02-15 ENCOUNTER — Inpatient Hospital Stay (HOSPITAL_COMMUNITY): Payer: Medicare Other

## 2018-02-15 DIAGNOSIS — R6521 Severe sepsis with septic shock: Secondary | ICD-10-CM

## 2018-02-15 DIAGNOSIS — A419 Sepsis, unspecified organism: Secondary | ICD-10-CM

## 2018-02-15 LAB — APTT
APTT: 52 s — AB (ref 24–36)
aPTT: 34 seconds (ref 24–36)
aPTT: 46 seconds — ABNORMAL HIGH (ref 24–36)

## 2018-02-15 LAB — CBC WITH DIFFERENTIAL/PLATELET
ABS IMMATURE GRANULOCYTES: 0.4 10*3/uL — AB (ref 0.0–0.1)
BASOS ABS: 0.1 10*3/uL (ref 0.0–0.1)
BASOS PCT: 0 %
Eosinophils Absolute: 0 10*3/uL (ref 0.0–0.7)
Eosinophils Relative: 0 %
HCT: 36 % — ABNORMAL LOW (ref 39.0–52.0)
HEMOGLOBIN: 11.4 g/dL — AB (ref 13.0–17.0)
IMMATURE GRANULOCYTES: 2 %
LYMPHS PCT: 3 %
Lymphs Abs: 0.6 10*3/uL — ABNORMAL LOW (ref 0.7–4.0)
MCH: 28.9 pg (ref 26.0–34.0)
MCHC: 31.7 g/dL (ref 30.0–36.0)
MCV: 91.4 fL (ref 78.0–100.0)
MONO ABS: 1.2 10*3/uL — AB (ref 0.1–1.0)
MONOS PCT: 6 %
NEUTROS ABS: 16.9 10*3/uL — AB (ref 1.7–7.7)
NEUTROS PCT: 89 %
PLATELETS: 609 10*3/uL — AB (ref 150–400)
RBC: 3.94 MIL/uL — ABNORMAL LOW (ref 4.22–5.81)
RDW: 14.3 % (ref 11.5–15.5)
WBC: 19.2 10*3/uL — ABNORMAL HIGH (ref 4.0–10.5)

## 2018-02-15 LAB — GLUCOSE, CAPILLARY
GLUCOSE-CAPILLARY: 130 mg/dL — AB (ref 70–99)
GLUCOSE-CAPILLARY: 143 mg/dL — AB (ref 70–99)
GLUCOSE-CAPILLARY: 173 mg/dL — AB (ref 70–99)
Glucose-Capillary: 140 mg/dL — ABNORMAL HIGH (ref 70–99)
Glucose-Capillary: 142 mg/dL — ABNORMAL HIGH (ref 70–99)
Glucose-Capillary: 146 mg/dL — ABNORMAL HIGH (ref 70–99)
Glucose-Capillary: 149 mg/dL — ABNORMAL HIGH (ref 70–99)

## 2018-02-15 LAB — BLOOD GAS, ARTERIAL
ACID-BASE EXCESS: 2.2 mmol/L — AB (ref 0.0–2.0)
BICARBONATE: 26.6 mmol/L (ref 20.0–28.0)
Drawn by: 44166
FIO2: 60
LHR: 18 {breaths}/min
MECHVT: 600 mL
O2 Saturation: 89.9 %
PEEP/CPAP: 8 cmH2O
PO2 ART: 60.5 mmHg — AB (ref 83.0–108.0)
Patient temperature: 98.6
pCO2 arterial: 44.5 mmHg (ref 32.0–48.0)
pH, Arterial: 7.395 (ref 7.350–7.450)

## 2018-02-15 LAB — BASIC METABOLIC PANEL
ANION GAP: 7 (ref 5–15)
BUN: 68 mg/dL — AB (ref 8–23)
CO2: 27 mmol/L (ref 22–32)
Calcium: 8.1 mg/dL — ABNORMAL LOW (ref 8.9–10.3)
Chloride: 101 mmol/L (ref 98–111)
Creatinine, Ser: 2.32 mg/dL — ABNORMAL HIGH (ref 0.61–1.24)
GFR calc Af Amer: 30 mL/min — ABNORMAL LOW (ref >60)
GFR, EST NON AFRICAN AMERICAN: 26 mL/min — AB (ref >60)
GLUCOSE: 150 mg/dL — AB (ref 70–99)
Potassium: 4.8 mmol/L (ref 3.5–5.1)
Sodium: 135 mmol/L (ref 135–145)

## 2018-02-15 LAB — MAGNESIUM: MAGNESIUM: 2.8 mg/dL — AB (ref 1.7–2.4)

## 2018-02-15 LAB — HEPARIN LEVEL (UNFRACTIONATED): Heparin Unfractionated: >2.2 IU/mL — ABNORMAL HIGH (ref 0.30–0.70)

## 2018-02-15 LAB — PHOSPHORUS: Phosphorus: 4.6 mg/dL (ref 2.5–4.6)

## 2018-02-15 MED ORDER — METOLAZONE 5 MG PO TABS
5.0000 mg | ORAL_TABLET | Freq: Every day | ORAL | Status: AC
  Administered 2018-02-15: 5 mg via ORAL
  Filled 2018-02-15: qty 1

## 2018-02-15 MED ORDER — SODIUM CHLORIDE 0.9 % IV SOLN
2.0000 g | INTRAVENOUS | Status: DC
  Administered 2018-02-15: 2 g via INTRAVENOUS
  Filled 2018-02-15 (×2): qty 20

## 2018-02-15 MED ORDER — FLEET ENEMA 7-19 GM/118ML RE ENEM
1.0000 | ENEMA | Freq: Once | RECTAL | Status: AC
  Administered 2018-02-15: 1 via RECTAL
  Filled 2018-02-15: qty 1

## 2018-02-15 MED ORDER — FUROSEMIDE 10 MG/ML IJ SOLN
5.0000 mg/h | INTRAVENOUS | Status: DC
  Administered 2018-02-16: 5 mg/h via INTRAVENOUS
  Filled 2018-02-15: qty 25

## 2018-02-15 NOTE — Progress Notes (Signed)
[place] for heparin  Indication: atrial fibrillation  Allergies  Allergen Reactions  . Ambien [Zolpidem] Anxiety    Anxiety and agitation when tried as sleeper in hospital aug 2019    Patient Measurements: Height: 5\' 10"  (177.8 cm) Weight: (!) 300 lb 4.3 oz (136.2 kg) IBW/kg (Calculated) : 73 Heparin Dosing Weight: 106 Vital Signs: Temp: 98.9 F (37.2 C) (08/20 2015) Temp Source: Axillary (08/20 2015) BP: 117/67 (08/20 2100) Pulse Rate: 66 (08/20 2100)  Labs: Recent Labs    02/13/18 0512  02/13/18 1700 02/14/18 0010 02/14/18 0555 02/14/18 1600  02/15/18 0419 02/15/18 1200 02/15/18 2129  HGB 12.3*  --   --   --  11.9*  --   --  11.4*  --   --   HCT 39.2  --   --   --  37.2*  --   --  36.0*  --   --   PLT 628*  --   --   --  624*  --   --  609*  --   --   APTT  --   --   --   --   --   --    < > 34 52* 46*  LABPROT  --   --   --   --  22.8*  --   --   --   --   --   INR  --   --   --   --  2.04  --   --   --   --   --   HEPARINUNFRC  --   --   --   --   --  >2.20*  --  >2.20*  --   --   CREATININE 2.59*   < > 2.54* 2.47*  --   --   --  2.32*  --   --   CKTOTAL  --   --   --   --  240  --   --   --   --   --   CKMB  --   --   --   --  8.0*  --   --   --   --   --    < > = values in this interval not displayed.    Estimated Creatinine Clearance: 38.3 mL/min (A) (by C-G formula based on SCr of 2.32 mg/dL (H)).   Medical History: Past Medical History:  Diagnosis Date  . Allergy    States his nose runs when he is around grease.   Marland Kitchen Anxiety   . Arthritis   . Atrial fibrillation ([place])   . Cancer ([place])   . Coronary artery disease   . History of total knee replacement   . Hypertension   . Hypothyroidism   . Kidney cancer, primary, with metastasis from kidney to other site [facility])   . Kidney stone   . OSA (obstructive sleep apnea)    pt does not wear cpap at night  . Pituitary cyst ([place])   . Pneumonia    hx of  . Prostate  cancer [facility])     Medications:   Assessment: 77 yo male with admitted for pneumonia, on apixaban PTA for Afib and history of PE. Holding apixaban and starting heparin for anticoagulation. Will use APTT to guide dosing as apixaban will affect heparin levels until the two labs correlate. Last apixaban dose was 8/18 ~1030.  APTT continues to remain subtherapeutic,  decreasing further from 52 to 46. H&H 11.4/36  plts 609. No bleeding documented.   Goal of Therapy:  APTT Goal 66-102 Heparin level 0.3-0.7 units/ml Monitor platelets by anticoagulation protocol: Yes   Plan:  Increase heparin gtt at 2250 units>> 8 hr APTT Daily CBC, heparin level, APTT  Monitor for signs and symptoms of bleeding.   Doylene Canard, PharmD Clinical Pharmacist  Pager: [phone] Phone: [phone] 02/15/2018    10:22 PM

## 2018-02-15 NOTE — Progress Notes (Signed)
[place] for heparin  Indication: atrial fibrillation  Allergies  Allergen Reactions  . Ambien [Zolpidem] Anxiety    Anxiety and agitation when tried as sleeper in hospital aug 2019    Patient Measurements: Height: 5\' 10"  (177.8 cm) Weight: (!) 300 lb 4.3 oz (136.2 kg) IBW/kg (Calculated) : 73 Heparin Dosing Weight: 106 Vital Signs: Temp: 98.5 F (36.9 C) (08/20 0400) Temp Source: Axillary (08/20 0400) BP: 114/68 (08/20 0600) Pulse Rate: 59 (08/20 0600)  Labs: Recent Labs    02/13/18 0512  02/13/18 1700 02/14/18 0010 02/14/18 0555 02/14/18 1600 02/14/18 1603 02/15/18 0419  HGB 12.3*  --   --   --  11.9*  --   --  11.4*  HCT 39.2  --   --   --  37.2*  --   --  36.0*  PLT 628*  --   --   --  624*  --   --  609*  APTT  --   --   --   --   --   --  40* 34  LABPROT  --   --   --   --  22.8*  --   --   --   INR  --   --   --   --  2.04  --   --   --   HEPARINUNFRC  --   --   --   --   --  >2.20*  --  >2.20*  CREATININE 2.59*   < > 2.54* 2.47*  --   --   --  2.32*  CKTOTAL  --   --   --   --  240  --   --   --   CKMB  --   --   --   --  8.0*  --   --   --    < > = values in this interval not displayed.    Estimated Creatinine Clearance: 38.3 mL/min (A) (by C-G formula based on SCr of 2.32 mg/dL (H)).   Assessment: 77 yo male with h/o Afib and PE, Eliquis on hold, for heparin   Goal of Therapy:  APTT Goal 66-102 Heparin level 0.3-0.7 units/ml Monitor platelets by anticoagulation protocol: Yes   Plan:  Increase Heparin  1850 units/hr APTT in 8 hours  Phillis Knack, PharmD, BCPS  02/15/2018    6:24 AM

## 2018-02-15 NOTE — Progress Notes (Signed)
Sputum collected and sent to the lab

## 2018-02-15 NOTE — Progress Notes (Signed)
ANTICOAGULATION CONSULT NOTE - Initial Consult  Pharmacy Consult for heparin  Indication: atrial fibrillation  Allergies  Allergen Reactions  . Ambien [Zolpidem] Anxiety    Anxiety and agitation when tried as sleeper in hospital aug 2019    Patient Measurements: Height: 5\' 10"  (177.8 cm) Weight: (!) 300 lb 4.3 oz (136.2 kg) IBW/kg (Calculated) : 73 Heparin Dosing Weight: 106 Vital Signs: Temp: 97.7 F (36.5 C) (08/20 1133) Temp Source: Oral (08/20 1133) BP: 131/86 (08/20 1143) Pulse Rate: 94 (08/20 1143)  Labs: Recent Labs    02/13/18 0512  02/13/18 1700 02/14/18 0010 02/14/18 0555 02/14/18 1600 02/14/18 1603 02/15/18 0419 02/15/18 1200  HGB 12.3*  --   --   --  11.9*  --   --  11.4*  --   HCT 39.2  --   --   --  37.2*  --   --  36.0*  --   PLT 628*  --   --   --  624*  --   --  609*  --   APTT  --   --   --   --   --   --  40* 34 52*  LABPROT  --   --   --   --  22.8*  --   --   --   --   INR  --   --   --   --  2.04  --   --   --   --   HEPARINUNFRC  --   --   --   --   --  >2.20*  --  >2.20*  --   CREATININE 2.59*   < > 2.54* 2.47*  --   --   --  2.32*  --   CKTOTAL  --   --   --   --  240  --   --   --   --   CKMB  --   --   --   --  8.0*  --   --   --   --    < > = values in this interval not displayed.    Estimated Creatinine Clearance: 38.3 mL/min (A) (by C-G formula based on SCr of 2.32 mg/dL (H)).   Medical History: Past Medical History:  Diagnosis Date  . Allergy    States his nose runs when he is around grease.   Marland Kitchen Anxiety   . Arthritis   . Atrial fibrillation ([place])   . Cancer ([place])   . Coronary artery disease   . History of total knee replacement   . Hypertension   . Hypothyroidism   . Kidney cancer, primary, with metastasis from kidney to other site [facility])   . Kidney stone   . OSA (obstructive sleep apnea)    pt does not wear cpap at night  . Pituitary cyst ([place])   . Pneumonia    hx of  . Prostate cancer [facility]     Medications:    Assessment: 77 yo male with admitted for pneumonia, on apixaban PTA for Afib and history of PE. Holding apixaban and starting heparin for anticoagulation. Will use APTT to guide dosing as apixaban will affect heparin levels until the two labs correlate. Last apixaban dose was 8/18 ~1030.  APTT was subtherapeutic today (52). H&H 11.4/36  plts 609. No bleeding documented.   Goal of Therapy:  APTT Goal 66-102 Heparin level 0.3-0.7 units/ml Monitor platelets by anticoagulation protocol: Yes  Plan:  Increase heparin gtt at 2050 units 8hr APTT Daily CBC, heparin level, APTT  Monitor for signs and symptoms of bleeding.   Harrietta Guardian, PharmD PGY1 Pharmacy Resident Direct Phone [phone] 02/15/2018    1:15 PM

## 2018-02-15 NOTE — Progress Notes (Signed)
PULMONARY / CRITICAL CARE MEDICINE   Name: Wayne Booth MRN: [id number] DOB: 1940/11/11    ADMISSION DATE:  02/02/2018 CONSULTATION DATE: 8/17/201  CHIEF COMPLAINT: Respiratory extremis   Brief      This is a 77 year old with a history of atrial fibrillation and diastolic dysfunction who presented 02/02/2018  with right-sided pleuritic chest pain fever and dyspnea.  He was treated with an empiric combination of azithromycin and Zosyn and at the same time was diuresed for diastolic failure.  Overnight last night 02/11/18  he has had increasing dyspnea.  He tells me that he has had midline chest pain is not clear whether or not this is been pleuritic.  There is no radiation.  He is currently not having any productive cough.  Patchy infiltrates on the CT scan of the chest obtained on 8/18 1 of them up appears consistent with a peripheral nodule.  He has a history of both prostate cancer and renal cancer and he tells me that both of these have undergone curative treatment.  SIGNIFICANT EVENTS:   EVENTS 02/02/2018 - admit 8/8 - CT CT with new findings of Bilateral LL consolidation and RML and RUL infilrrates compated to April 2018 8/17 - tx ICU and itnubation; Vancomycin and Zosyn started 8/17. Had new onset fever since admit with rising WBC 20K.  cxr - stable iniflrtares PICC placed.   SUBJECTIVE/OVERNIGHT/INTERVAL HX  8/18 - Needing 60% fio2./peep 5 - fio2 wore overnight. On fent gtt but oriented. Has trazazone prn , xanax prn and lexapro scheduled , pamelor schedule, and sovorexant for insomnia .  On levophed 4mcg. Also on eliquis,  amio tab, cardiszem tab and lopressor table. Noted allopurinol and colchicine  S/p 2 doses solumedrol yesterday  Sons and daughter in law rerport - significant diuresis in medical floor with fluid restriction.  Low NA worse to 129. AKI worse 1.6 -> 2.6 . Also report dusky toes  VITAL SIGNS: BP 108/61   Pulse (!) 58   Temp 98.5 F (36.9 C) (Oral)   Resp 18    Ht 5\' 10"  (1.778 m)   Wt (!) 136.2 kg   SpO2 90%   BMI 43.08 kg/m   HEMODYNAMICS: CVP:  [18 mmHg-21 mmHg] 18 mmHg  VENTILATOR SETTINGS: Vent Mode: PRVC FiO2 (%):  [50 %-60 %] 60 % Set Rate:  [18 bmp] 18 bmp Vt Set:  [600 mL] 600 mL PEEP:  [8 cmH20] 8 cmH20 Plateau Pressure:  [22 cmH20-29 cmH20] 28 cmH20  INTAKE / OUTPUT: I/O last 3 completed shifts: In: 3045.7 [I.V.:1544.3; NG/GT:180; IV Piggyback:1321.4] Out: 4695 [Urine:4695]  PHYSICAL EXAMINATION: General: More sedate this AM, acutely ill appearing male HEENT: [place]/AT, PERRL, EOM-I and MMM Hrt: RRR, Nl S1/S2 and -M/R/G Lung: Coarse BS diffusely Abd: Soft, NT, distended and +BS Ext: 1+ edema, dependent edema in the patient's side Skin: Intact  PULMONARY Recent Labs  Lab 02/12/18 1536 02/13/18 0337 02/15/18 0355  PHART 7.332* 7.292* 7.395  PCO2ART 42.7 47.8 44.5  PO2ART 249.0* 69.0* 60.5*  HCO3 22.6 23.1 26.6  TCO2 24 25  --   O2SAT 100.0 91.0 89.9   CBC Recent Labs  Lab 02/13/18 0512 02/14/18 0555 02/15/18 0419  HGB 12.3* 11.9* 11.4*  HCT 39.2 37.2* 36.0*  WBC 18.8* 25.0* 19.2*  PLT 628* 624* 609*   COAGULATION Recent Labs  Lab 02/14/18 0555  INR 2.04   CARDIAC  No results for input(s): TROPONINI in the last 168 hours. No results for input(s): PROBNP in the last  168 hours.  CHEMISTRY Recent Labs  Lab 02/13/18 0512 02/13/18 1247 02/13/18 1700 02/14/18 0010 02/14/18 0555 02/14/18 1603 02/15/18 0419  NA 129* 129* 128* 131*  --   --  135  K 5.1 4.7 4.8 4.7  --   --  4.8  CL 96* 97* 97* 96*  --   --  101  CO2 22 24 23 25   --   --  27  GLUCOSE 163* 183* 173* 152*  --   --  150*  BUN 42* 43* 46* 52*  --   --  68*  CREATININE 2.59* 2.44* 2.54* 2.47*  --   --  2.32*  CALCIUM 8.3* 7.8* 7.6* 8.3*  --   --  8.1*  MG 2.4  --  2.5*  --  2.7* 2.9* 2.8*  PHOS 5.9*  --  5.1*  --  4.5 4.2 4.6   Estimated Creatinine Clearance: 38.3 mL/min (A) (by C-G formula based on SCr of 2.32 mg/dL  (H)).  LIVER Recent Labs  Lab 02/14/18 0555  AST 147*  ALT 294*  ALKPHOS 84  BILITOT 1.1  PROT 6.6  ALBUMIN 2.1*  INR 2.04   INFECTIOUS Recent Labs  Lab 02/12/18 1332 02/12/18 1647 02/13/18 0512 02/14/18 0001 02/14/18 0555  LATICACIDVEN 2.9* 2.8*  --  1.2  --   PROCALCITON 0.43  --  1.10  --  0.69   ENDOCRINE CBG (last 3)  Recent Labs    02/14/18 2358 02/15/18 0408 02/15/18 0802  GLUCAP 130* 142* 143*   IMAGING x48h  - image(s) personally visualized  -   highlighted in bold Dg Chest Port 1 View  Result Date: 02/15/2018 CLINICAL DATA:  Intubation. EXAM: PORTABLE CHEST 1 VIEW COMPARISON:  02/14/2018. FINDINGS: Endotracheal tube, NG tube, right PICC line stable position. Cardiomegaly with diffuse bilateral pulmonary infiltrates/edema again noted. Small bilateral pleural effusions again noted. Findings suggest CHF. No pneumothorax. Prior cervical spine fusion. Metallic clip noted over the left chest. IMPRESSION: 1.  Lines and tubes stable position. 2. Cardiomegaly with diffuse bilateral pulmonary infiltrates/edema and small bilateral pleural effusions again noted. No interim change. Electronically Signed   By: Marcello Moores  Register   On: 02/15/2018 06:21   Dg Chest Port 1 View  Result Date: 02/14/2018 CLINICAL DATA:  Encounter for weaning from ventilator. EXAM: PORTABLE CHEST 1 VIEW COMPARISON:  Radiograph of February 13, 2018. FINDINGS: Endotracheal and nasogastric tubes are unchanged in position. Right-sided PICC line is noted. Stable cardiomegaly with central pulmonary vascular congestion is noted. Stable bibasilar atelectasis or edema is noted with associated pleural effusions. No pneumothorax is noted. Bony thorax is unremarkable. IMPRESSION: Stable support apparatus. Stable bibasilar atelectasis or edema is noted with associated pleural effusions. Electronically Signed   By: Marijo Conception, M.D.   On: 02/14/2018 07:29   LINES/TUBES: ETT 8/17>>> R PICC 8/17>>>  DISCUSSION:       This is an obese gentleman with a history of sleep apnea, atrial fibrillation status post ablation, history of transsphenoidal resection, history of prostate cancer with seed placement, history of cancer of the kidney status post left nephrectomy and history of nephrolithiasis who presented on 8/8 with right sided pleuritic chest pain.  He also has a history of diastolic heart failure.  He was a CTA was obtained which showed patchy infiltrates on the right and no PE.  He was empirically treated with a combination of azithromycin and cefepime but today was acutely dyspneic.  ASSESSMENT / PLAN:  PULMONARY A: Acute resp  failure - admitted 02/02/2018 but intubated 02/12/18 for fever and resp distress; suspect HAP  Plan Hold pressure support trials today Full vent support Titrate O2 for sat of 88-92% Active diureses today  CARDIOVASCULAR A: ADmitted 02/02/2018 with pneumonia and also diast chf per family  On 02/12/18 intubated and since then has circulatory shock with levophed need, cool feet though pulses +, NSR with 80but on cardizem tab, lopressor tab and amio tab  Plan Continue to hold cardizem Hold lopressor  Levophed drip as ordered Tele monitoring If HR increases will consider amiodarone, actually bradycardic since started the precedex Heparin drip  RENAL A: He has a solitary kidney, chronic renal insufficiency appears to be stable at 1.6 creatinine Plan KVO IVF Follow CVP Lasix drip at 5 mg/hr Zaroxolyn 5 mg PO x1 BMET in AM Replace electrolytes as indicated  GASTROINTESTINAL A: GI prophylaxis has been ordered with Pepcid  Plan TF per nutrition Fleet enema x1 (did not get it on 8/19) Colace BID  MOM BID  HEMATOLOGIC  A: Chronically on Eliquis  (Estimated Creatinine Clearance: 38.3 mL/min (A) (by C-G formula based on SCr of 2.32 mg/dL (H)). )  Recent Labs  Lab 02/13/18 0512 02/13/18 1247 02/13/18 1700 02/14/18 0010 02/15/18 0419  CREATININE 2.59* 2.44* 2.54*  2.47* 2.32*  P - Heparin drip - Eliquis once extubated and taking PO  INFECTIOUS A: Possible and likely HAP 02/12/18.  Plan Vanc/zosyn, change to rocephin/vanc Resp culture speciation but MRSA positive  ENDOCRINE A: Has a history of transsphenoidal resection and is currently on steroids and thyroid replacement  P ISS CBGs  NEUROLOGIC A:  Baseline  -Has trazazone prn , xanax prn and lexapro scheduled , pamelor schedule, and sovorexant for insomnia .  Plan Continue pamelol and lexapro for now Continue fent gtt Hold xanax Hold traazodone Hold sovorrexant Versed prn Precedex drip  MSK A gout Plan Hold allopurinil Hold colchicine given renal function  FAMILY No family bedside to update  The patient is critically ill with multiple organ systems failure and requires high complexity decision making for assessment and support, frequent evaluation and titration of therapies, application of advanced monitoring technologies and extensive interpretation of multiple databases.   Critical Care Time devoted to patient care services described in this note is  34  Minutes. This time reflects time of care of this signee Dr [name]. This critical care time does not reflect procedure time, or teaching time or supervisory time of PA/NP/Med student/Med Resident etc but could involve care discussion time.  Rush Farmer, M.D. [facility] Pulmonary/Critical Care Medicine. Pager: [phone]. After hours pager: [phone].

## 2018-02-16 ENCOUNTER — Inpatient Hospital Stay (HOSPITAL_COMMUNITY): Payer: Medicare Other

## 2018-02-16 LAB — GLUCOSE, CAPILLARY
GLUCOSE-CAPILLARY: 151 mg/dL — AB (ref 70–99)
GLUCOSE-CAPILLARY: 109 mg/dL — AB (ref 70–99)
Glucose-Capillary: 201 mg/dL — ABNORMAL HIGH (ref 70–99)
Glucose-Capillary: 132 mg/dL — ABNORMAL HIGH (ref 70–99)
Glucose-Capillary: 131 mg/dL — ABNORMAL HIGH (ref 70–99)
Glucose-Capillary: 132 mg/dL — ABNORMAL HIGH (ref 70–99)

## 2018-02-16 LAB — CBC WITH DIFFERENTIAL/PLATELET
Abs Immature Granulocytes: 0.7 10*3/uL — ABNORMAL HIGH (ref 0.0–0.1)
BASOS ABS: 0.1 10*3/uL (ref 0.0–0.1)
Basophils Relative: 0 %
EOS ABS: 0.1 10*3/uL (ref 0.0–0.7)
EOS PCT: 0 %
HEMATOCRIT: 39.5 % (ref 39.0–52.0)
Hemoglobin: 12.8 g/dL — ABNORMAL LOW (ref 13.0–17.0)
Immature Granulocytes: 3 %
LYMPHS ABS: 0.9 10*3/uL (ref 0.7–4.0)
Lymphocytes Relative: 4 %
MCH: 29.4 pg (ref 26.0–34.0)
MCHC: 32.4 g/dL (ref 30.0–36.0)
MCV: 90.8 fL (ref 78.0–100.0)
Monocytes Absolute: 1.7 10*3/uL — ABNORMAL HIGH (ref 0.1–1.0)
Monocytes Relative: 8 %
Neutro Abs: 17.4 10*3/uL — ABNORMAL HIGH (ref 1.7–7.7)
Neutrophils Relative %: 85 %
Platelets: 639 10*3/uL — ABNORMAL HIGH (ref 150–400)
RBC: 4.35 MIL/uL (ref 4.22–5.81)
RDW: 14.2 % (ref 11.5–15.5)
WBC: 20.7 10*3/uL — AB (ref 4.0–10.5)

## 2018-02-16 LAB — POCT I-STAT 3, ART BLOOD GAS (G3+)
ACID-BASE EXCESS: 5 mmol/L — AB (ref 0.0–2.0)
BICARBONATE: 29.2 mmol/L — AB (ref 20.0–28.0)
O2 Saturation: 95 %
PO2 ART: 74 mmHg — AB (ref 83.0–108.0)
TCO2: 30 mmol/L (ref 22–32)
pCO2 arterial: 42.7 mmHg (ref 32.0–48.0)
pH, Arterial: 7.444 (ref 7.350–7.450)

## 2018-02-16 LAB — CULTURE, RESPIRATORY W GRAM STAIN: Special Requests: NORMAL

## 2018-02-16 LAB — BASIC METABOLIC PANEL
ANION GAP: 7 (ref 5–15)
BUN: 85 mg/dL — ABNORMAL HIGH (ref 8–23)
CO2: 30 mmol/L (ref 22–32)
Calcium: 8.1 mg/dL — ABNORMAL LOW (ref 8.9–10.3)
Chloride: 100 mmol/L (ref 98–111)
Creatinine, Ser: 2.06 mg/dL — ABNORMAL HIGH (ref 0.61–1.24)
GFR calc Af Amer: 35 mL/min — ABNORMAL LOW (ref >60)
GFR, EST NON AFRICAN AMERICAN: 30 mL/min — AB (ref >60)
GLUCOSE: 164 mg/dL — AB (ref 70–99)
POTASSIUM: 4.3 mmol/L (ref 3.5–5.1)
Sodium: 137 mmol/L (ref 135–145)

## 2018-02-16 LAB — MAGNESIUM: Magnesium: 2.7 mg/dL — ABNORMAL HIGH (ref 1.7–2.4)

## 2018-02-16 LAB — CULTURE, RESPIRATORY

## 2018-02-16 LAB — APTT
APTT: 66 s — AB (ref 24–36)
aPTT: 68 s — ABNORMAL HIGH (ref 24–36)

## 2018-02-16 LAB — PHOSPHORUS: PHOSPHORUS: 4.6 mg/dL (ref 2.5–4.6)

## 2018-02-16 LAB — HEPARIN LEVEL (UNFRACTIONATED): Heparin Unfractionated: 1.86 IU/mL — ABNORMAL HIGH (ref 0.30–0.70)

## 2018-02-16 MED ORDER — QUETIAPINE FUMARATE 25 MG PO TABS
25.0000 mg | ORAL_TABLET | Freq: Two times a day (BID) | ORAL | Status: DC
  Administered 2018-02-16 – 2018-02-17 (×3): 25 mg via ORAL
  Filled 2018-02-16 (×3): qty 1

## 2018-02-16 MED ORDER — CLONAZEPAM 0.5 MG PO TBDP: 0.5000 mg | ORAL_TABLET | Freq: Two times a day (BID) | ORAL | Status: DC

## 2018-02-16 MED ORDER — CLONAZEPAM 0.5 MG PO TBDP
0.5000 mg | ORAL_TABLET | Freq: Two times a day (BID) | ORAL | Status: DC
  Administered 2018-02-16 (×2): 0.5 mg
  Filled 2018-02-16 (×2): qty 1

## 2018-02-16 MED ORDER — IPRATROPIUM-ALBUTEROL 0.5-2.5 (3) MG/3ML IN SOLN: 3.0000 mL | RESPIRATORY_TRACT | Status: DC | PRN

## 2018-02-16 MED ORDER — VITAL HIGH PROTEIN PO LIQD
1000.0000 mL | ORAL | Status: DC
  Administered 2018-02-16: 1000 mL

## 2018-02-16 MED ORDER — VANCOMYCIN HCL 10 G IV SOLR
1500.0000 mg | INTRAVENOUS | Status: DC
  Administered 2018-02-17 – 2018-02-18 (×2): 1500 mg via INTRAVENOUS
  Filled 2018-02-16 (×2): qty 1500

## 2018-02-16 MED ORDER — METHYLPREDNISOLONE SODIUM SUCC 40 MG IJ SOLR
40.0000 mg | Freq: Every day | INTRAMUSCULAR | Status: DC
  Administered 2018-02-17: 40 mg via INTRAVENOUS
  Filled 2018-02-16 (×2): qty 1

## 2018-02-16 NOTE — Progress Notes (Signed)
Suring for heparin  Indication: atrial fibrillation  Allergies  Allergen Reactions  . Ambien [Zolpidem] Anxiety    Anxiety and agitation when tried as sleeper in hospital aug 2019    Patient Measurements: Height: 5\' 10"  (177.8 cm) Weight: 292 lb 12.3 oz (132.8 kg) IBW/kg (Calculated) : 73 Heparin Dosing Weight: 106 Vital Signs: Temp: 98 F (36.7 C) (08/21 0745) Temp Source: Oral (08/21 0745) BP: 137/74 (08/21 0741) Pulse Rate: 85 (08/21 0741)  Labs: Recent Labs    02/14/18 0010  02/14/18 0555 02/14/18 1600  02/15/18 0419 02/15/18 1200 02/15/18 2129 02/16/18 0521 02/16/18 0638  HGB  --    < > 11.9*  --   --  11.4*  --   --  12.8*  --   HCT  --   --  37.2*  --   --  36.0*  --   --  39.5  --   PLT  --   --  624*  --   --  609*  --   --  639*  --   APTT  --   --   --   --    < > 34 52* 46*  --  66*  LABPROT  --   --  22.8*  --   --   --   --   --   --   --   INR  --   --  2.04  --   --   --   --   --   --   --   HEPARINUNFRC  --   --   --  >2.20*  --  >2.20*  --   --   --  1.86*  CREATININE 2.47*  --   --   --   --  2.32*  --   --  2.06*  --   CKTOTAL  --   --  240  --   --   --   --   --   --   --   CKMB  --   --  8.0*  --   --   --   --   --   --   --    < > = values in this interval not displayed.    Estimated Creatinine Clearance: 42.5 mL/min (A) (by C-G formula based on SCr of 2.06 mg/dL (H)).   Medical History: Past Medical History:  Diagnosis Date  . Allergy    States his nose runs when he is around grease.   Marland Kitchen Anxiety   . Arthritis   . Atrial fibrillation ([place])   . Cancer ([place])   . Coronary artery disease   . History of total knee replacement   . Hypertension   . Hypothyroidism   . Kidney cancer, primary, with metastasis from kidney to other site [facility])   . Kidney stone   . OSA (obstructive sleep apnea)    pt does not wear cpap at night  . Pituitary cyst ([place])   . Pneumonia    hx of  . Prostate cancer  [facility])     Medications:   Assessment: 77 yo male with admitted for pneumonia, on apixaban PTA for Afib and history of PE. Holding apixaban and starting heparin for anticoagulation. Will use APTT to guide dosing as apixaban will affect heparin levels until the two labs correlate. Last apixaban dose was 8/18 ~1030.  First therapeutic aPTT today on the low end  of range (66). H&H 12.8/39.5  plts 639 . No bleeding documented.   Goal of Therapy:  APTT Goal 66-102 Heparin level 0.3-0.7 units/ml Monitor platelets by anticoagulation protocol: Yes   Plan:  Increase heparin gtt at 2300 units/hr 8 hr APTT Daily CBC, heparin level, APTT  Monitor for signs and symptoms of bleeding.

## 2018-02-16 NOTE — Progress Notes (Signed)
ANTICOAGULATION CONSULT NOTE  Pharmacy Consult for heparin  Indication: atrial fibrillation  Patient Measurements: Height: 5\' 10"  (177.8 cm) Weight: 292 lb 12.3 oz (132.8 kg) IBW/kg (Calculated) : 73 Heparin Dosing Weight: 106 Vital Signs: Temp: 98.3 F (36.8 C) (08/21 2020) Temp Source: Axillary (08/21 2020) BP: 121/68 (08/21 2100) Pulse Rate: 69 (08/21 2100)  Labs: Recent Labs    02/14/18 0010  02/14/18 0555 02/14/18 1600  02/15/18 0419  02/16/18 0521 02/16/18 0638 02/16/18 1810 02/16/18 1945  HGB  --    < > 11.9*  --   --  11.4*  --  12.8*  --   --   --   HCT  --   --  37.2*  --   --  36.0*  --  39.5  --   --   --   PLT  --   --  624*  --   --  609*  --  639*  --   --   --   APTT  --   --   --   --    < > 34   < >  --  66* >200* 68*  LABPROT  --   --  22.8*  --   --   --   --   --   --   --   --   INR  --   --  2.04  --   --   --   --   --   --   --   --   HEPARINUNFRC  --   --   --  >2.20*  --  >2.20*  --   --  1.86*  --   --   CREATININE 2.47*  --   --   --   --  2.32*  --  2.06*  --   --   --   CKTOTAL  --   --  240  --   --   --   --   --   --   --   --   CKMB  --   --  8.0*  --   --   --   --   --   --   --   --    < > = values in this interval not displayed.   Assessment: 77 yo male with admitted for pneumonia, on apixaban PTA for Afib and history of PE. Holding apixaban and starting heparin for anticoagulation. Will use APTT to guide dosing as apixaban will affect heparin levels until the two labs correlate. Last apixaban dose was 8/18 ~1030.  APTT remains therapeutic at the low end of therapeutic range. Initially the aPTT resulted as > 200, this was presumed to have been drawn through the line. The lab was re-drawn and resulted at 68 which is inline with previous value.   Goal of Therapy:  APTT Goal 66-102 Heparin level 0.3-0.7 units/ml Monitor platelets by anticoagulation protocol: Yes    Plan:  Continue heparin gtt at 2300 units/hr Next level with Am  labs  Wayne Booth 02/16/2018 9:44 PM

## 2018-02-16 NOTE — Progress Notes (Signed)
eLink Physician-Brief Progress Note Patient Name: CHANSE KAGEL DOB: 07-Apr-1941 MRN: [id number]   Date of Service  02/16/2018  HPI/Events of Note  Delirium   eICU Interventions  Will order: 1. Increase Precedex IV infusion ceiling to 1.7 mcg/kg/hour     Intervention Category Major Interventions: Delirium, psychosis, severe agitation - evaluation and management  Sommer,Steven Eugene 02/16/2018, 8:53 PM

## 2018-02-16 NOTE — Progress Notes (Addendum)
Nutrition Follow-up  DOCUMENTATION CODES:   Morbid obesity  INTERVENTION:    Recommend increase Vital High Protein as tolerated to goal rate of 60 ml/h (1440 ml per day)  Pro-stat 60 ml BID  Provides 1840 kcal, 186 gm protein, 1204 ml free water daily  NUTRITION DIAGNOSIS:   Inadequate oral intake related to inability to eat as evidenced by NPO status.  Ongoing  GOAL:   Provide needs based on ASPEN/SCCM guidelines  Unmet  MONITOR:   Vent status, TF tolerance, Weight trends, Labs, I & O's  ASSESSMENT:   77 yo male with PMH of HTN, arthritis, hypothyroidism, OSA, prostate cancer, renal cancer, CAD, and A fib who was admitted on 8/7 with chest pain, fever, dyspnea, bilateral LL consolidation and RML & RUL infiltrates. Required intubation on 8/17.  Per discussion with RN, TF is currently running at 20 ml/h; per CCM NP, increasing to 40 ml/h today. She had a large BM yesterday. Tolerating TF well.   Pulmonary edema, volume overload, patient diuresed well overnight. Lasix resumed today.  I/O -11.8 L since admission.  Patient remains intubated on ventilator support MV: 10.5 L/min Temp (24hrs), Avg:98.5 F (36.9 C), Min:97.5 F (36.4 C), Max:99.7 F (37.6 C)   Labs reviewed. CBG's: [phone] Medications reviewed and include Solu-medrol, MVI, Colace, Milk of magnesia.   Diet Order:   Diet Order    None      EDUCATION NEEDS:   No education needs have been identified at this time  Skin:  Skin Assessment: Reviewed RN Assessment  Last BM:  8/20  Height:   Ht Readings from Last 1 Encounters:  02/12/18 5\' 10"  (1.778 m)    Weight:   Wt Readings from Last 1 Encounters:  02/16/18 132.8 kg    Ideal Body Weight:  75.5 kg  BMI:  Body mass index is 42.01 kg/m.  Estimated Nutritional Needs:   Kcal:  1480-1880  Protein:  189 gm  Fluid:  2 L    Molli Barrows, RD, LDN, [place] Pager [phone] After Hours Pager [phone]

## 2018-02-16 NOTE — Progress Notes (Signed)
Pharmacy Antibiotic Note  Wayne Booth is a 77 y.o. male admitted on 02/02/2018. Treating for pneumonia.  Pharmacy has been consulted for vancomycin. Patient originally restarted on vancomycin and Zosyn on 8/17, Zosyn was descalated to ceftriaxone on 8/20, but stopped ceftriaxone on 8/21 due to MRSA sputum culture result. Patient has an AKI which is improving, most recent Scr is 2.06 with good UOP. WBC 20.7,  PCT 0.69  Plan: Increase Vancomycin from 1750mg  Q48H to 1500mg  Q24H after 2100 dose today Monitor clinical picture, renal function, VT prn F/U C&S, abx deescalation / LOT  Height: 5\' 10"  (177.8 cm) Weight: 292 lb 12.3 oz (132.8 kg) IBW/kg (Calculated) : 73  Temp (24hrs), Avg:98.7 F (37.1 C), Min:97.5 F (36.4 C), Max:99.7 F (37.6 C)  Recent Labs  Lab 02/12/18 1332 02/12/18 1647 02/13/18 0512 02/13/18 1247 02/13/18 1700 02/14/18 0001 02/14/18 0010 02/14/18 0555 02/15/18 0419 02/16/18 0521  WBC  --   --  18.8*  --   --   --   --  25.0* 19.2* 20.7*  CREATININE  --   --  2.59* 2.44* 2.54*  --  2.47*  --  2.32* 2.06*  LATICACIDVEN 2.9* 2.8*  --   --   --  1.2  --   --   --   --     Estimated Creatinine Clearance: 42.5 mL/min (A) (by C-G formula based on SCr of 2.06 mg/dL (H)).    Allergies  Allergen Reactions  . Ambien [Zolpidem] Anxiety    Anxiety and agitation when tried as sleeper in hospital aug 2019    Antimicrobials this admission: Vancomycin 8/7 >>8/8; 8/17 >> Cefepime 8/7 >> (8/16); 8/17 >>8/17 (not given) Zosyn 8/17 >8/20 CTX 8/20 >>8/21  Dose adjustments this admission: 8/17 Vancomycin 2000mg x1 then 1750mg  Q48H (8/22) Vancomycin from 1750mg  Q48H to 1500mg  Q24H  Microbiology results: 8/8: MRSA PCR >> neg 8/7 blood>>neg 8/8 Sputum negative 8/8 Resp - Mod GPC > nrf 8/17 BCx - ngtd 8/17 mrsa pcr positive 8/17 sputum - MRSA 8/18 UC -   Thank you for allowing pharmacy to be a part of this patient's care.  Harrietta Guardian, PharmD PGY1 Pharmacy  Resident Direct Phone [phone] 02/16/2018    1:09 PM

## 2018-02-16 NOTE — Progress Notes (Addendum)
PULMONARY / CRITICAL CARE MEDICINE   Name: Wayne Booth MRN: [id number] DOB: [birth year]    ADMISSION DATE:  02/02/2018 CONSULTATION DATE: 8/17/201  CHIEF COMPLAINT: Respiratory extremis   Brief 77 year old with a history of atrial fibrillation and diastolic dysfunction who presented 02/02/2018 with right-sided pleuritic chest pain fever and dyspnea.  He was treated with an empiric combination of azithromycin and cefepime and at the same time was diuresed for diastolic failure.  Overnight 02/11/18  he has had increasing dyspnea with midline chest pain is not clear whether or not this is been pleuritic; no radiation, no productive cough.  Patchy infiltrates on the CT scan of the chest obtained on 8/18 1 of them up appears consistent with a peripheral nodule.  He has a history of both prostate cancer and renal cancer and reported both of these have undergone curative treatment.  SUBJECTIVE/OVERNIGHT/INTERVAL HX Failed SBT this am x 2- no effort per pt RN reports patient goes from being completely sedated to then wild, biting, and trying to pull at lines Large BM yesterday after enema Was on precedex 1.2 and fentanyl 350 this am, now tolerating precedex at 1 and fentanyl 150 mcg/hr Neg 4L/ net -10L and at admit weight Lasix gtt was held overnight and restarted at 7am  VITAL SIGNS: BP 137/74   Pulse 85   Temp 98 F (36.7 C) (Oral)   Resp 19   Ht 5\' 10"  (1.778 m)   Wt 132.8 kg   SpO2 95%   BMI 42.01 kg/m   HEMODYNAMICS:    VENTILATOR SETTINGS: Vent Mode: PRVC FiO2 (%):  [40 %-60 %] 50 % Set Rate:  [18 bmp] 18 bmp Vt Set:  [600 mL] 600 mL PEEP:  [8 cmH20] 8 cmH20 Plateau Pressure:  [22 cmH20-33 cmH20] 28 cmH20  INTAKE / OUTPUT: I/O last 3 completed shifts: In: 3277.2 [I.V.:2454; NG/GT:120; IV Piggyback:703.3] Out: 8480 [Urine:8480]  PHYSICAL EXAMINATION: General:  Elderly male on MV in NAD HEENT: MM pink/moist, pupils 3/reactive, anicteric, ETT 8 cm at 24 lip, OGT   Neuro:  Sedated but awakens to verbal, f/c, MAE, calm CV: SR/SB ~60, no murmur, +2 pulses PULM: even/non-labored on MV full support, lungs bilaterally coarse R>L, small thick tan secretions GI: obese, distended, NT, +BS  Extremities: warm/dry, +1 BLE edema  Skin: no rashes    PULMONARY Recent Labs  Lab 02/12/18 1536 02/13/18 0337 02/15/18 0355 02/16/18 0351  PHART 7.332* 7.292* 7.395 7.444  PCO2ART 42.7 47.8 44.5 42.7  PO2ART 249.0* 69.0* 60.5* 74.0*  HCO3 22.6 23.1 26.6 29.2*  TCO2 24 25  --  30  O2SAT 100.0 91.0 89.9 95.0   CBC Recent Labs  Lab 02/14/18 0555 02/15/18 0419 02/16/18 0521  HGB 11.9* 11.4* 12.8*  HCT 37.2* 36.0* 39.5  WBC 25.0* 19.2* 20.7*  PLT 624* 609* 639*   COAGULATION Recent Labs  Lab 02/14/18 0555  INR 2.04   CARDIAC  No results for input(s): TROPONINI in the last 168 hours. No results for input(s): PROBNP in the last 168 hours.  CHEMISTRY Recent Labs  Lab 02/13/18 1247 02/13/18 1700 02/14/18 0010 02/14/18 0555 02/14/18 1603 02/15/18 0419 02/16/18 0521  NA 129* 128* 131*  --   --  135 137  K 4.7 4.8 4.7  --   --  4.8 4.3  CL 97* 97* 96*  --   --  101 100  CO2 24 23 25   --   --  27 30  GLUCOSE 183* 173* 152*  --   --  150* 164*  BUN 43* 46* 52*  --   --  68* 85*  CREATININE 2.44* 2.54* 2.47*  --   --  2.32* 2.06*  CALCIUM 7.8* 7.6* 8.3*  --   --  8.1* 8.1*  MG  --  2.5*  --  2.7* 2.9* 2.8* 2.7*  PHOS  --  5.1*  --  4.5 4.2 4.6 4.6   Estimated Creatinine Clearance: 42.5 mL/min (A) (by C-G formula based on SCr of 2.06 mg/dL (H)).  LIVER Recent Labs  Lab 02/14/18 0555  AST 147*  ALT 294*  ALKPHOS 84  BILITOT 1.1  PROT 6.6  ALBUMIN 2.1*  INR 2.04   INFECTIOUS Recent Labs  Lab 02/12/18 1332 02/12/18 1647 02/13/18 0512 02/14/18 0001 02/14/18 0555  LATICACIDVEN 2.9* 2.8*  --  1.2  --   PROCALCITON 0.43  --  1.10  --  0.69   ENDOCRINE CBG (last 3)  Recent Labs    02/15/18 2348 02/16/18 0348 02/16/18 0754  GLUCAP 146*  201* 132*   IMAGING x48h  - image(s) personally visualized  -   highlighted in bold Dg Chest Port 1 View  Result Date: 02/16/2018 CLINICAL DATA:  Ventilator support. EXAM: PORTABLE CHEST 1 VIEW COMPARISON:  02/15/2018 FINDINGS: Endotracheal tube tip is 4.5 cm above the carina. Nasogastric tube enters the abdomen. Right arm PICC tip is in the right atrium. Bilateral lower lobe atelectasis/infiltrate persists. No worsening or new finding. IMPRESSION: Lines and tubes appear unchanged. Persistent bilateral lower lobe atelectasis/pneumonia. Electronically Signed   By: Nelson Chimes M.D.   On: 02/16/2018 07:27   Dg Chest Port 1 View  Result Date: 02/15/2018 CLINICAL DATA:  Intubation. EXAM: PORTABLE CHEST 1 VIEW COMPARISON:  02/14/2018. FINDINGS: Endotracheal tube, NG tube, right PICC line stable position. Cardiomegaly with diffuse bilateral pulmonary infiltrates/edema again noted. Small bilateral pleural effusions again noted. Findings suggest CHF. No pneumothorax. Prior cervical spine fusion. Metallic clip noted over the left chest. IMPRESSION: 1.  Lines and tubes stable position. 2. Cardiomegaly with diffuse bilateral pulmonary infiltrates/edema and small bilateral pleural effusions again noted. No interim change. Electronically Signed   By: Marcello Moores  Register   On: 02/15/2018 06:21   LINES/TUBES: ETT 8/17>>> R PICC 8/17>>>  CULTURES: 8/7 BC x 2 >> neg 8/8 MRSA PCR >> negatve 8/8 sputum cx >> neg 8/17 MRSA PCR >> positive  8/17 trach asp cx >> few staph aureus >> 8/17 BCx2 >> 8/18 UC >> neg 8/20 trach asp >>MRSA  ANTIBIOTICS: 8/7 vanc >> 8/8; 8/17 >> 8/7 cefepime >> 8/15 8/11 azithromycin >> 8/17 8/17 zosyn >> 8/20 Rocephin 8/20>>>8/21  EVENTS 02/02/2018 - admit 8/8 - CT CT with new findings of Bilateral LL consolidation and RML and RUL infilrrates compated to April 2018 8/17 - tx ICU and intubation; Vancomycin and Zosyn started 8/17. Had new onset fever since admit with rising WBC 20K.   cxr - stable iniflrtares PICC placed.  8/18 - Needing 60% fio2./peep 5 - fio2 worsening overnight. On fent gtt but oriented. Has trazazone prn , xanax prn and lexapro scheduled , pamelor schedule, and sovorexant for insomnia .  On levophed 67mcg. Also on eliquis,  amio tab, cardiszem tab and lopressor table. Noted allopurinol and colchicine; s/p 2 doses solumedrol yesterday.  Sons and daughter in law rerport - significant diuresis in medical floor with fluid restriction.  Low NA worse to 129. AKI worse 1.6 -> 2.6 . Also report dusky toes   DISCUSSION:  This is an obese gentleman with a history of sleep apnea, atrial fibrillation status post ablation, history of transsphenoidal resection, history of prostate cancer with seed placement, history of cancer of the kidney status post left nephrectomy and history of nephrolithiasis who presented on 8/8 with right sided pleuritic chest pain.  He also has a history of diastolic heart failure.  CTA showed patchy infiltrates on the right and no PE.  He was empirically treated with a combination of azithromycin and cefepime but developed acute dyspnea 8/17 requiring intubation.  ASSESSMENT / PLAN:  PULMONARY A:  Acute hypoxic Resp Failure  HCAP Hx OSA - admitted 02/02/2018 but intubated 02/12/18 for fever and resp distress; suspect HAP - CXR today with stable lines/tubes; persistent BLL atelectasis/ pneumonia  Plan: Continue full MV support, 8 cc/kg Wean FiO2/ peep as able for sats 88-92% Continue daily SBT, although failed this am, no effort Diuresis as below Pepcid for SUP See below for ID  Pulmicort BID, duonebs prn q4  Hold off weaning, goal for today is 30% and 5 Continue diureses  CARDIOVASCULAR A:  Diastolic HF Shock - possibly septic +/- cardiogenic , resolved; off pressors since 8/19 Hx Afib on prior eliquis, CAD, HTN - TTE 8/9 LVEF 50-55%; technically difficult study; no clear wall motion abnormalities  Plan: Tele monitor Goal MAP  > 65 Continue amiodarone 200 mg daily Holding cardizem and lopressor ( SR maintaining 58-70's while on precedex)  Continue Heparin drip per pharm Continue Lasix gtt at 5 mg/hr   RENAL A:  AKI on CKD; hx of solitary kidney - sCr improving, slight rise in BUN/ CO2, will hold off on further dose of zaroxolyn today  Plan:  Continue Lasix drip at 5 mg/hr Trend BMP/ mag/ phos / urinary output/ daily wts Replace electrolytes as indicated  GASTROINTESTINAL A:  NPO Constipation s/p BM 8/20 Plan:  TF per nutrition, increase from 20 ml/hr to 40 Colace BID  MOM BID  HEMATOLOGIC  A: Chronically on Eliquis  (Estimated Creatinine Clearance: 42.5 mL/min (A) (by C-G formula based on SCr of 2.06 mg/dL (H)). )  Recent Labs  Lab 02/13/18 1247 02/13/18 1700 02/14/18 0010 02/15/18 0419 02/16/18 0521  CREATININE 2.44* 2.54* 2.47* 2.32* 2.06*  P Heparin drip per pharmacy Will transition back to Eliquis once extubated and taking PO  INFECTIOUS A:  Possible and likely HAP 02/12/18.  Plan continue rocephin/vanc PCT in am  Follow trach cultures Trend WBC/ fever curve   ENDOCRINE A:  Hx of transsphenoidal resection and is currently on steroids and thyroid replacement  Plan:  Continue synthroid daily Decrease solumedrol to 40 mg daily  Trend CBGs, add SSI if glucose > 180 x 2  NEUROLOGIC A:   Baseline  -home trazazone prn, xanax prn and lexapro scheduled , pamelor schedule, and sovorexant for insomnia .  Plan Continue pamelol and lexapro for now Continue fentanyl and precedex gtt, goal RASS 0/-1 PRN versed Bowel regimen with PAD protocol  Add klonopin BID to facilitate weaning of IV gtts Holding xanax, trazodone, sovorrexant  MSK A: Gout Plan:  Hold allopurinil Continue daily colchicine; hold if renal function worsens  FAMILY No family bedside to update 8/21.    CCT 45 mins  Kennieth Rad, AGACNP-BC [facility] Pgr: [phone] or if no answer  [phone] 02/16/2018, 10:21 AM  Attending Note:  77 year old male with CHF and renal cell CA s/p nephrectomy how presents to PCCM with respiratory failure from PNA and fluid overload.  On exam, he is agitated but follows some commands with crackles diffusely.  I reviewed CXR myself, ETT is in a good position with pulmonary edema noted.  Discussed with PCCM-NP.  Lasix was held overnight, not sure why but patient diuresed very well.  Will restart lasix drip at 5 but hold zaroxolyn and diamox.  Replace electrolytes as need.  Goal today is to achieve 30% and PEEP of 5.  If successful then will proceed with weaning.  PCCM will continue to follow.  The patient is critically ill with multiple organ systems failure and requires high complexity decision making for assessment and support, frequent evaluation and titration of therapies, application of advanced monitoring technologies and extensive interpretation of multiple databases.   Critical Care Time devoted to patient care services described in this note is  36  Minutes. This time reflects time of care of this signee Dr [name]. This critical care time does not reflect procedure time, or teaching time or supervisory time of PA/NP/Med student/Med Resident etc but could involve care discussion time.  Rush Farmer, M.D. [facility] Pulmonary/Critical Care Medicine. Pager: [phone]. After hours pager: [phone].

## 2018-02-17 ENCOUNTER — Inpatient Hospital Stay (HOSPITAL_COMMUNITY): Payer: Medicare Other

## 2018-02-17 LAB — BLOOD GAS, ARTERIAL
Acid-Base Excess: 8.7 mmol/L — ABNORMAL HIGH (ref 0.0–2.0)
Bicarbonate: 32 mmol/L — ABNORMAL HIGH (ref 20.0–28.0)
Drawn by: 511911
FIO2: 30
MECHVT: 600 mL
O2 SAT: 88.7 %
PATIENT TEMPERATURE: 98.6
PCO2 ART: 38.2 mmHg (ref 32.0–48.0)
PEEP/CPAP: 5 cmH2O
PH ART: 7.533 — AB (ref 7.350–7.450)
PO2 ART: 53.5 mmHg — AB (ref 83.0–108.0)
RATE: 18 resp/min

## 2018-02-17 LAB — BASIC METABOLIC PANEL
ANION GAP: 8 (ref 5–15)
BUN: 69 mg/dL — ABNORMAL HIGH (ref 8–23)
CALCIUM: 6.7 mg/dL — AB (ref 8.9–10.3)
CHLORIDE: 103 mmol/L (ref 98–111)
CO2: 28 mmol/L (ref 22–32)
Creatinine, Ser: 1.49 mg/dL — ABNORMAL HIGH (ref 0.61–1.24)
GFR calc Af Amer: 51 mL/min — ABNORMAL LOW (ref >60)
GFR calc non Af Amer: 44 mL/min — ABNORMAL LOW (ref >60)
GLUCOSE: 107 mg/dL — AB (ref 70–99)
Potassium: 3.1 mmol/L — ABNORMAL LOW (ref 3.5–5.1)
Sodium: 139 mmol/L (ref 135–145)

## 2018-02-17 LAB — HEPATIC FUNCTION PANEL
ALT: 142 U/L — ABNORMAL HIGH (ref 0–44)
AST: 50 U/L — ABNORMAL HIGH (ref 15–41)
Albumin: 1.8 g/dL — ABNORMAL LOW (ref 3.5–5.0)
Alkaline Phosphatase: 56 U/L (ref 38–126)
BILIRUBIN DIRECT: 0.3 mg/dL — AB (ref 0.0–0.2)
BILIRUBIN INDIRECT: 0.5 mg/dL (ref 0.3–0.9)
BILIRUBIN TOTAL: 0.8 mg/dL (ref 0.3–1.2)
Total Protein: 5.1 g/dL — ABNORMAL LOW (ref 6.5–8.1)

## 2018-02-17 LAB — CULTURE, RESPIRATORY

## 2018-02-17 LAB — CBC
HEMATOCRIT: 36.9 % — AB (ref 39.0–52.0)
Hemoglobin: 11.7 g/dL — ABNORMAL LOW (ref 13.0–17.0)
MCH: 28.9 pg (ref 26.0–34.0)
MCHC: 31.7 g/dL (ref 30.0–36.0)
MCV: 91.1 fL (ref 78.0–100.0)
PLATELETS: 579 10*3/uL — AB (ref 150–400)
RBC: 4.05 MIL/uL — AB (ref 4.22–5.81)
RDW: 14.6 % (ref 11.5–15.5)
WBC: 20.7 10*3/uL — ABNORMAL HIGH (ref 4.0–10.5)

## 2018-02-17 LAB — GLUCOSE, CAPILLARY
GLUCOSE-CAPILLARY: 120 mg/dL — AB (ref 70–99)
GLUCOSE-CAPILLARY: 121 mg/dL — AB (ref 70–99)
Glucose-Capillary: 157 mg/dL — ABNORMAL HIGH (ref 70–99)
Glucose-Capillary: 136 mg/dL — ABNORMAL HIGH (ref 70–99)
Glucose-Capillary: 164 mg/dL — ABNORMAL HIGH (ref 70–99)
Glucose-Capillary: 188 mg/dL — ABNORMAL HIGH (ref 70–99)

## 2018-02-17 LAB — CULTURE, BLOOD (ROUTINE X 2)
CULTURE: NO GROWTH
Culture: NO GROWTH
Special Requests: ADEQUATE
Special Requests: ADEQUATE

## 2018-02-17 LAB — APTT
aPTT: >200 seconds (ref 24–36)
aPTT: 56 seconds — ABNORMAL HIGH (ref 24–36)
aPTT: >200 s (ref 24–36)
aPTT: 106 s — ABNORMAL HIGH (ref 24–36)

## 2018-02-17 LAB — PROCALCITONIN: Procalcitonin: 0.17 ng/mL

## 2018-02-17 LAB — HEPARIN LEVEL (UNFRACTIONATED): Heparin Unfractionated: 1.76 IU/mL — ABNORMAL HIGH (ref 0.30–0.70)

## 2018-02-17 LAB — MAGNESIUM: Magnesium: 1.7 mg/dL (ref 1.7–2.4)

## 2018-02-17 LAB — PHOSPHORUS: Phosphorus: 2.4 mg/dL — ABNORMAL LOW (ref 2.5–4.6)

## 2018-02-17 LAB — CULTURE, RESPIRATORY W GRAM STAIN

## 2018-02-17 MED ORDER — FUROSEMIDE 10 MG/ML IJ SOLN
40.0000 mg | Freq: Every day | INTRAMUSCULAR | Status: DC
  Administered 2018-02-17: 40 mg via INTRAVENOUS
  Filled 2018-02-17: qty 4

## 2018-02-17 MED ORDER — MIDAZOLAM HCL 2 MG/2ML IJ SOLN
1.0000 mg | INTRAMUSCULAR | Status: DC | PRN
  Administered 2018-02-17 – 2018-02-18 (×8): 2 mg via INTRAVENOUS
  Filled 2018-02-17 (×7): qty 2

## 2018-02-17 MED ORDER — MAGNESIUM SULFATE 2 GM/50ML IV SOLN
2.0000 g | Freq: Once | INTRAVENOUS | Status: AC
  Administered 2018-02-17: 2 g via INTRAVENOUS
  Filled 2018-02-17: qty 50

## 2018-02-17 MED ORDER — ROCURONIUM BROMIDE 50 MG/5ML IV SOLN: 1.0000 mg/kg | Freq: Once | INTRAVENOUS | Status: DC

## 2018-02-17 MED ORDER — CLONAZEPAM 0.5 MG PO TBDP
1.0000 mg | ORAL_TABLET | Freq: Two times a day (BID) | ORAL | Status: DC
  Administered 2018-02-17 – 2018-02-20 (×8): 1 mg
  Filled 2018-02-17 (×8): qty 2

## 2018-02-17 MED ORDER — FENTANYL CITRATE (PF) 100 MCG/2ML IJ SOLN
INTRAMUSCULAR | Status: AC
  Administered 2018-02-17: 100 ug
  Filled 2018-02-17: qty 4

## 2018-02-17 MED ORDER — VITAL HIGH PROTEIN PO LIQD
1000.0000 mL | ORAL | Status: DC
  Administered 2018-02-17 – 2018-02-20 (×5): 1000 mL
  Filled 2018-02-17: qty 1000

## 2018-02-17 MED ORDER — POTASSIUM CHLORIDE 10 MEQ/50ML IV SOLN
10.0000 meq | INTRAVENOUS | Status: DC
  Administered 2018-02-17 (×2): 10 meq via INTRAVENOUS
  Filled 2018-02-17 (×4): qty 50

## 2018-02-17 MED ORDER — BUDESONIDE 0.5 MG/2ML IN SUSP
0.5000 mg | Freq: Two times a day (BID) | RESPIRATORY_TRACT | Status: DC
  Administered 2018-02-17 – 2018-02-26 (×17): 0.5 mg via RESPIRATORY_TRACT
  Filled 2018-02-17 (×19): qty 2

## 2018-02-17 MED ORDER — POTASSIUM PHOSPHATES 15 MMOLE/5ML IV SOLN
30.0000 mmol | Freq: Once | INTRAVENOUS | Status: AC
  Administered 2018-02-17: 30 mmol via INTRAVENOUS
  Filled 2018-02-17: qty 10

## 2018-02-17 MED ORDER — POTASSIUM CHLORIDE 20 MEQ/15ML (10%) PO SOLN
20.0000 meq | Freq: Once | ORAL | Status: AC
  Administered 2018-02-17: 20 meq
  Filled 2018-02-17: qty 15

## 2018-02-17 MED ORDER — ROCURONIUM BROMIDE 50 MG/5ML IV SOLN
50.0000 mg | Freq: Once | INTRAVENOUS | Status: AC
  Administered 2018-02-17: 50 mg via INTRAVENOUS

## 2018-02-17 MED ORDER — MIDAZOLAM HCL 2 MG/2ML IJ SOLN
INTRAMUSCULAR | Status: AC
  Administered 2018-02-17: 2 mg
  Filled 2018-02-17: qty 4

## 2018-02-17 NOTE — Procedures (Signed)
Intubation Procedure Note Wayne Booth [id number] [birth year]  Procedure: Intubation Indications: Respiratory insufficiency  Procedure Details Consent: Risks of procedure as well as the alternatives and risks of each were explained to the (patient/caregiver).  Consent for procedure obtained. Time Out: Verified patient identification, verified procedure, site/side was marked, verified correct patient position, special equipment/implants available, medications/allergies/relevent history reviewed, required imaging and test results available.  Performed  Maximum sterile technique was used including gloves, hand hygiene and mask.  MAC    Evaluation Hemodynamic Status: BP stable throughout; O2 sats: stable throughout Patient's Current Condition: stable Complications: No apparent complications Patient did tolerate procedure well. Chest X-ray ordered to verify placement.  CXR: pending.   [name] 02/17/2018

## 2018-02-17 NOTE — Progress Notes (Signed)
ANTICOAGULATION CONSULT NOTE  Pharmacy Consult for heparin  Indication: atrial fibrillation  Patient Measurements: Height: 5\' 10"  (177.8 cm) Weight: 290 lb 12.6 oz (131.9 kg) IBW/kg (Calculated) : 73 Heparin Dosing Weight: 106 Vital Signs: Temp: 99.5 F (37.5 C) (08/22 2000) Temp Source: Oral (08/22 2000) BP: 116/65 (08/22 2200) Pulse Rate: 62 (08/22 2200)  Labs: Recent Labs    02/15/18 0419  02/16/18 0521 02/16/18 6203  02/17/18 0330 02/17/18 0537 02/17/18 0539 02/17/18 1816 02/17/18 2004  HGB 11.4*  --  12.8*  --   --  11.7*  --   --   --   --   HCT 36.0*  --  39.5  --   --  36.9*  --   --   --   --   PLT 609*  --  639*  --   --  579*  --   --   --   --   APTT 34   < >  --  66*   < > >200* 56*  --  >200* 106*  HEPARINUNFRC >2.20*  --   --  1.86*  --  >2.20*  --  1.76*  --   --   CREATININE 2.32*  --  2.06*  --   --  1.49*  --   --   --   --    < > = values in this interval not displayed.   Assessment: 77 yo male with admitted for pneumonia, on apixaban PTA for Afib and history of PE. Holding apixaban and starting heparin for anticoagulation. Heparin levels appear to still be effected by previous Eliquis use.  Prior to this evening, aPTT has remained sub-therapeutic. Initially tonight the aPTT resulted as > 200, this was presumed to have been drawn through the line. The lab was re-drawn and resulted at 106 which is slightly supratherapeutic. Will leave heparin rate as is and check again with morning labs.    Goal of Therapy:  APTT Goal 66-102 Heparin level 0.3-0.7 units/ml Monitor platelets by anticoagulation protocol: Yes    Plan:  Continue heparin gtt at 2400 units/hr Next level with Am labs  Harvel Quale 02/17/2018 10:11 PM

## 2018-02-17 NOTE — Progress Notes (Addendum)
PULMONARY / CRITICAL CARE MEDICINE   Name: Wayne Booth MRN: [id number] DOB: Nov 03, 1940    ADMISSION DATE:  02/02/2018 CONSULTATION DATE: 8/17/201  CHIEF COMPLAINT: Respiratory extremis   Brief 77 year old with a history of atrial fibrillation and diastolic dysfunction who presented 02/02/2018 with right-sided pleuritic chest pain fever and dyspnea.  He was treated with an empiric combination of azithromycin and cefepime and at the same time was diuresed for diastolic failure.  Overnight 02/11/18  he has had increasing dyspnea with midline chest pain is not clear whether or not this is been pleuritic; no radiation, no productive cough.  Patchy infiltrates on the CT scan of the chest obtained on 8/18 1 of them up appears consistent with a peripheral nodule.  He has a history of both prostate cancer and renal cancer and reported both of these have undergone curative treatment.  SUBJECTIVE/OVERNIGHT/INTERVAL HX Per RN, no events other than OGT had to be advanced as was not secured and came out some while still on TF Remains on fentanyl gtt 300 mcg/hr and precedex 1.2, and still requiring prn versed pushes every 2-3 hours for intermittent ongoing agitation (bitting tube, failing arms, reaching for tubes) 2 bowel movements overnight Diuresed -5.4 L on lasix gtt at 5 CXR this am notes ETT in right mainstem   VITAL SIGNS: BP 104/65   Pulse (!) 57   Temp 98.9 F (37.2 C) (Oral)   Resp 18   Ht 5\' 10"  (1.778 m)   Wt 131.9 kg   SpO2 93%   BMI 41.72 kg/m   HEMODYNAMICS: CVP:  [8 mmHg-16 mmHg] 8 mmHg  VENTILATOR SETTINGS: Vent Mode: PRVC FiO2 (%):  [30 %-40 %] 30 % Set Rate:  [18 bmp] 18 bmp Vt Set:  [600 mL] 600 mL PEEP:  [5 cmH20-8 cmH20] 5 cmH20 Plateau Pressure:  [20 cmH20-28 cmH20] 21 cmH20  INTAKE / OUTPUT: I/O last 3 completed shifts: In: 2131.7 [I.V.:1861.7; NG/GT:120; IV Piggyback:150] Out: 9205 [Urine:9205]  PHYSICAL EXAMINATION: General:  Critically ill elderly male on  MV in NAD HEENT: MM pink/moist, ETT 8, initially at 29 at the lip but was retracted to around 24, OGT (verfied placement with RN with +air rush in gastric), pupils 3/reactive, anicteric Neuro:  Awakens to voice, f/c, MAE, calm currently CV: SR 89, no m/r/g PULM: tachypneic 28 and decreased TV prior to ETT retraction, post traction, equal clear breath sounds with normal effort, slightly diminished in L base, minimal secretions GI: obese, less distention, softer, +BS  Extremities: warm/dry, trace tibial edema  Skin: no rashes  PULMONARY Recent Labs  Lab 02/12/18 1536 02/13/18 0337 02/15/18 0355 02/16/18 0351 02/17/18 0355  PHART 7.332* 7.292* 7.395 7.444 7.533*  PCO2ART 42.7 47.8 44.5 42.7 38.2  PO2ART 249.0* 69.0* 60.5* 74.0* 53.5*  HCO3 22.6 23.1 26.6 29.2* 32.0*  TCO2 24 25  --  30  --   O2SAT 100.0 91.0 89.9 95.0 88.7   CBC Recent Labs  Lab 02/15/18 0419 02/16/18 0521 02/17/18 0330  HGB 11.4* 12.8* 11.7*  HCT 36.0* 39.5 36.9*  WBC 19.2* 20.7* 20.7*  PLT 609* 639* 579*   COAGULATION Recent Labs  Lab 02/14/18 0555  INR 2.04   CARDIAC  No results for input(s): TROPONINI in the last 168 hours. No results for input(s): PROBNP in the last 168 hours.  CHEMISTRY Recent Labs  Lab 02/13/18 1700 02/14/18 0010 02/14/18 0555 02/14/18 1603 02/15/18 0419 02/16/18 0521 02/17/18 0330  NA 128* 131*  --   --  135  137 139  K 4.8 4.7  --   --  4.8 4.3 3.1*  CL 97* 96*  --   --  101 100 103  CO2 23 25  --   --  27 30 28   GLUCOSE 173* 152*  --   --  150* 164* 107*  BUN 46* 52*  --   --  68* 85* 69*  CREATININE 2.54* 2.47*  --   --  2.32* 2.06* 1.49*  CALCIUM 7.6* 8.3*  --   --  8.1* 8.1* 6.7*  MG 2.5*  --  2.7* 2.9* 2.8* 2.7* 1.7  PHOS 5.1*  --  4.5 4.2 4.6 4.6 2.4*   Estimated Creatinine Clearance: 58.5 mL/min (A) (by C-G formula based on SCr of 1.49 mg/dL (H)).  LIVER Recent Labs  Lab 02/14/18 0555 02/17/18 0330  AST 147* 50*  ALT 294* 142*  ALKPHOS 84 56   BILITOT 1.1 0.8  PROT 6.6 5.1*  ALBUMIN 2.1* 1.8*  INR 2.04  --    INFECTIOUS Recent Labs  Lab 02/12/18 1332 02/12/18 1647 02/13/18 0512 02/14/18 0001 02/14/18 0555 02/17/18 0330  LATICACIDVEN 2.9* 2.8*  --  1.2  --   --   PROCALCITON 0.43  --  1.10  --  0.69 0.17   ENDOCRINE CBG (last 3)  Recent Labs    02/16/18 2319 02/17/18 0349 02/17/18 0752  GLUCAP 109* 121* 157*   IMAGING x48h  - image(s) personally visualized  -   highlighted in bold Dg Chest Port 1 View  Result Date: 02/17/2018 CLINICAL DATA:  Shortness of breath, check endotracheal tube placement EXAM: PORTABLE CHEST 1 VIEW COMPARISON:  02/16/2018 FINDINGS: Endotracheal tube appears to have been advanced and now lies at the level of the carina directed towards the right mainstem bronchus. This should be withdrawn approximately 3 cm. Nasogastric catheter and right-sided PICC line are stable. Cardiac enlargement is again identified. Persistent bibasilar infiltrates left greater than right are noted with associated small effusions. No bony abnormality is noted. IMPRESSION: Stable appearance of the chest with the exception of interval apparent advancement of the endotracheal tube to the level of the carina now directed towards the right mainstem bronchus. This should be withdrawn approximately 3 cm. These results will be called to the ordering clinician or representative by the Radiologist Assistant, and communication documented in the PACS or zVision Dashboard. Electronically Signed   By: Inez Catalina M.D.   On: 02/17/2018 06:55   Dg Chest Port 1 View  Result Date: 02/16/2018 CLINICAL DATA:  Ventilator support. EXAM: PORTABLE CHEST 1 VIEW COMPARISON:  02/15/2018 FINDINGS: Endotracheal tube tip is 4.5 cm above the carina. Nasogastric tube enters the abdomen. Right arm PICC tip is in the right atrium. Bilateral lower lobe atelectasis/infiltrate persists. No worsening or new finding. IMPRESSION: Lines and tubes appear unchanged.  Persistent bilateral lower lobe atelectasis/pneumonia. Electronically Signed   By: Nelson Chimes M.D.   On: 02/16/2018 07:27   LINES/TUBES: ETT 8/17>>> R PICC 8/17>>>  CULTURES: 8/7 BC x 2 >> neg 8/8 MRSA PCR >> negatve 8/8 sputum cx >> neg 8/17 MRSA PCR >> positive  8/17 trach asp cx >> few MRSA 8/17 BCx2 >> 8/18 UC >> neg 8/20 trach asp >>  ANTIBIOTICS: 8/7 vanc >> 8/8; 8/17 >> 8/7 cefepime >> 8/15 8/11 azithromycin >> 8/17 >> 8/21 8/17 zosyn >> 8/20 Rocephin 8/20>>>8/21  EVENTS 02/02/2018 - admit 8/8 - CT CT with new findings of Bilateral LL consolidation and RML and RUL infilrrates  compated to  April 2018 8/17 - tx ICU and intubation; Vancomycin and Zosyn started 8/17. Had new onset fever since admit  with rising WBC 20K.  cxr - stable iniflrtares PICC placed. 8/18 - Needing 60% fio2./peep 5 - fio2 worsening overnight. On fent gtt but oriented. Has trazazone  prn , xanax prn and lexapro scheduled , pamelor schedule, and sovorexant for insomnia .   On levophed 35mcg. Also on eliquis,  amio tab, cardiszem tab and lopressor table. Noted  allopurinol and colchicine; s/p 2 doses solumedrol yesterday.  Sons and daughter in law  rerport - significant diuresis in medical floor with fluid restriction.  Low NA worse to 129. AKI  worse 1.6 -> 2.6 . Also report dusky toes 8/21 Failed SBT this am x 2- no effort per pt.  RN reports patient goes from being completely  sedated to then wild, biting, and trying to pull at lines. Large BM yesterday after enema.  Was  on precedex 1.2 and fentanyl 350 this am, now tolerating precedex at 1 and fentanyl 150  Mcg/hr Neg 4L/ net -10L and at admit weight; Lasix gtt was held overnight and restarted at  7am  DISCUSSION:      This is an obese gentleman with a history of sleep apnea, atrial fibrillation s/p ablation, history of transsphenoidal resection, prostate cancer with seed placement, cancer of the kidney status post left nephrectomy and nephrolithiasis who  presented on 8/8 with right sided pleuritic chest pain.  He also has a history of diastolic heart failure.  CTA showed patchy infiltrates on the right and no PE.  He was empirically treated with a combination of azithromycin and cefepime but developed acute dyspnea 8/17 requiring intubation.  He has been diuresed.  Sputum cx showing few MRSA PNA being treated with vanc.  Ongoing agitation has been a barrier to weaning.   ASSESSMENT / PLAN:  PULMONARY A:  Acute hypoxic Resp Failure  HCAP- MRSA PNA Hx OSA - intubated 02/12/18 for fever and resp distress - day 6/x of ETT - CXR today w/ ETT in right mainstem  - failed SBT today, became tachypneic in 40's, increased agitation - ABG noted this am, likely to reflect R mainstem intubation, as when first seen he was tachypneic ~30 with lower TVs, and after retraction of ETT, rr returned to 18-20 with good TVs Plan: Begin PS trials Wean FiO2/ peep as able for sats 88-92%, down to 30/5 Continue daily SBT, am and pm  Pepcid for SUP See below for ID  Pulmicort BID, duonebs prn q4   CARDIOVASCULAR A:  Diastolic HF Shock - possibly septic +/- cardiogenic , resolved; off pressors since 8/19 Hx Afib on prior eliquis, CAD, HTN - TTE 8/9 LVEF 50-55%; technically difficult study; no clear wall motion abnormalities  Plan: Tele monitor Goal MAP > 65 Continue amiodarone 200 mg daily Holding cardizem and lopressor ( SR maintaining 58-70's while on precedex)  Continue Heparin drip per pharm- aPTTs have been abnormal, most likely incorrect lab draw, repeats have been expected range Stop lasix gtt, as patient on exam with trace edema and weight is below admit/ previous wts noted in charts for several months Will start daily lasix 40mg  daily, as home dose  RENAL A:  AKI on CKD; hx of solitary kidney - sCr continues to improve, good UOP and diuresis Hypokalemia/ hypomagnesia/ hypophos Plan:  S/p K x 2 runs, and mag Kphos 30 mmol today  Trend BMP/  mag/ phos / urinary output/ daily  wts Replace electrolytes as indicated  GASTROINTESTINAL A:  NPO Constipation s/p BM 8/20, 8/21 Plan:  TF per nutrition, increase today to goal of 60 ml/hr Colace BID  D/c MOM BID, dulcolax prn   HEMATOLOGIC  A: Chronically on Eliquis  (Estimated Creatinine Clearance: 58.5 mL/min (A) (by C-G formula based on SCr of 1.49 mg/dL (H)). )  Recent Labs  Lab 02/13/18 1700 02/14/18 0010 02/15/18 0419 02/16/18 0521 02/17/18 0330  CREATININE 2.54* 2.47* 2.32* 2.06* 1.49*  P Heparin drip per pharmacy Will transition back to Eliquis once extubated and taking PO  INFECTIOUS A:  MRSA PNA - PCT 0.17 Plan Continue vanc for goal of 7 days Follow trach cultures from 7/20 Trend WBC/ fever curve   ENDOCRINE A:  Hx of transsphenoidal resection and is currently on steroids and thyroid replacement  Plan:  Continue synthroid daily continue solumedrol to 40 mg daily  Trend CBGs, add SSI if glucose > 180 x 2  NEUROLOGIC A:   Delirium  - home trazazone prn, xanax prn and lexapro scheduled , pamelor schedule, and sovorexant for insomnia .  Plan Continue pamelol and lexapro Continue fentanyl and precedex gtt, goal RASS 0/-1 PRN versed- decreased dosage 1-2 prn q 2 Bowel regimen with PAD protocol  Increase klonopin to 1mg  BID to facilitate weaning of IV gtts Continue seroquel, hold on increasing dosage, QTc 0.500 today, ongoing monitor Holding xanax, trazodone, sovorrexant  MSK A: Gout Plan:  Hold allopurinil Continue daily colchicine  FAMILY No family bedside to update 8/22.   CCT 45 mins  Kennieth Rad, AGACNP-BC [facility] Pgr: [phone] or if no answer [phone] 02/17/2018, 8:29 AM  Attending Note:  77 year old male with CHF history presenting with respiratory failure due to PNA from MRSA and fluid overload.  On exam, sedate with increased secretions from ETT.  I reviewed CXR, ETT is in the right main, will  adjust.  D/C lasix drip.  Start home dose of lasix.  Continue vancomycin.  Replace electrolytes.  Family updated.  PCCM will continue to follow.  The patient is critically ill with multiple organ systems failure and requires high complexity decision making for assessment and support, frequent evaluation and titration of therapies, application of advanced monitoring technologies and extensive interpretation of multiple databases.   Critical Care Time devoted to patient care services described in this note is  33  Minutes. This time reflects time of care of this signee Dr [name]. This critical care time does not reflect procedure time, or teaching time or supervisory time of PA/NP/Med student/Med Resident etc but could involve care discussion time.  Rush Farmer, M.D. [facility] Pulmonary/Critical Care Medicine. Pager: [phone]. After hours pager: [phone].

## 2018-02-17 NOTE — Procedures (Signed)
OGT Insertion By MD  Inserted under direct laryngoscopy and verified by auscultation  Rush Farmer, M.D. [facility] Pulmonary/Critical Care Medicine. Pager: [phone]. After hours pager: [phone].

## 2018-02-17 NOTE — Progress Notes (Signed)
[place] Progress Note Patient Name: Wayne Booth DOB: 1940/10/06 MRN: [id number]   Date of Service  02/17/2018  HPI/Events of Note  K+ = 3.1, Mg++ = 1.7 and Creatinine = 1.49.   eICU Interventions  Will replace Mg++ and K+.     Intervention Category Major Interventions: Electrolyte abnormality - evaluation and management  Sommer,Steven Eugene 02/17/2018, 6:11 AM

## 2018-02-17 NOTE — Progress Notes (Signed)
CSW continuing to follow at this time for further discharge needs once medically stable. CSW aware that pt remains on vent at 30 percent at this time.    Virgie Dad Herold Salguero, MSW, [place] [facility] Clinical Social Worker [phone]

## 2018-02-17 NOTE — Procedures (Signed)
Extubation Procedure Note  Patient Details:   Name: Wayne Booth DOB: 1940-12-01 MRN: [id number]   Airway Documentation:    Vent end date: 02/17/18 Vent end time: 1245   Evaluation  O2 sats: transiently fell during during procedure and currently acceptable Complications: No apparent complications Patient did tolerate procedure well. Bilateral Breath Sounds: Diminished   Yes   Patient extubated per MD order. Was intially on 4LNC, but sat 86%. Placed on ventimask at 55%. SAT currently 93%. MD aware  Saunders Glance 02/17/2018, 12:53 PM

## 2018-02-17 NOTE — Progress Notes (Signed)
ANTICOAGULATION CONSULT NOTE  Pharmacy Consult for heparin  Indication: atrial fibrillation  Patient Measurements: Height: 5\' 10"  (177.8 cm) Weight: 290 lb 12.6 oz (131.9 kg) IBW/kg (Calculated) : 73 Heparin Dosing Weight: 106 Vital Signs: Temp: 98.9 F (37.2 C) (08/22 0754) Temp Source: Oral (08/22 0754) BP: 94/61 (08/22 0900) Pulse Rate: 69 (08/22 0900)  Labs: Recent Labs    02/15/18 0419  02/16/18 0521 02/16/18 4709  02/16/18 1945 02/17/18 0330 02/17/18 0537 02/17/18 0539  HGB 11.4*  --  12.8*  --   --   --  11.7*  --   --   HCT 36.0*  --  39.5  --   --   --  36.9*  --   --   PLT 609*  --  639*  --   --   --  579*  --   --   APTT 34   < >  --  66*   < > 68* >200* 56*  --   HEPARINUNFRC >2.20*  --   --  1.86*  --   --  >2.20*  --  1.76*  CREATININE 2.32*  --  2.06*  --   --   --  1.49*  --   --    < > = values in this interval not displayed.   Assessment: 77 yo male with admitted for pneumonia, on apixaban PTA for Afib and history of PE. Holding apixaban and starting heparin for anticoagulation. Will use APTT to guide dosing as apixaban will affect heparin levels until the two labs correlate. Last apixaban dose was 8/18 ~1030.  APTT this morning  resulted as > 200, presumed to be drawn incorrecty. The lab was re-drawn and resulted at 56 which is subtherapeutic. H&H 11.7/36.9, plts 579. No bleeding documented   Of note: approaching Guard-rails maximum gtt of 2500 units/hr  Goal of Therapy:  APTT Goal 66-102 Heparin level 0.3-0.7 units/ml Monitor platelets by anticoagulation protocol: Yes    Plan:  Increase Heparin gtt to 2400 units/hr Draw 8hr aPTT Monitor Heparin Level, aPTT, CBC, s/s bleeding daily Recommend resuming  PTA apixaban when PO tolerated  Harrietta Guardian, PharmD PGY1 Pharmacy Resident Direct Phone [phone] 02/17/2018    10:32 AM

## 2018-02-18 ENCOUNTER — Telehealth: Payer: Self-pay | Admitting: Family Medicine

## 2018-02-18 ENCOUNTER — Inpatient Hospital Stay: Payer: Medicare Other | Admitting: Family Medicine

## 2018-02-18 ENCOUNTER — Inpatient Hospital Stay (HOSPITAL_COMMUNITY): Payer: Medicare Other

## 2018-02-18 DIAGNOSIS — I959 Hypotension, unspecified: Secondary | ICD-10-CM

## 2018-02-18 LAB — CBC
HEMATOCRIT: 42.2 % (ref 39.0–52.0)
Hemoglobin: 13.5 g/dL (ref 13.0–17.0)
MCH: 29 pg (ref 26.0–34.0)
MCHC: 32 g/dL (ref 30.0–36.0)
MCV: 90.6 fL (ref 78.0–100.0)
Platelets: 575 10*3/uL — ABNORMAL HIGH (ref 150–400)
RBC: 4.66 MIL/uL (ref 4.22–5.81)
RDW: 14.9 % (ref 11.5–15.5)
WBC: 24 10*3/uL — AB (ref 4.0–10.5)

## 2018-02-18 LAB — RENAL FUNCTION PANEL
Albumin: 2.3 g/dL — ABNORMAL LOW (ref 3.5–5.0)
Anion gap: 8 (ref 5–15)
BUN: 78 mg/dL — AB (ref 8–23)
CHLORIDE: 101 mmol/L (ref 98–111)
CO2: 32 mmol/L (ref 22–32)
Calcium: 8.5 mg/dL — ABNORMAL LOW (ref 8.9–10.3)
Creatinine, Ser: 1.83 mg/dL — ABNORMAL HIGH (ref 0.61–1.24)
GFR calc Af Amer: 40 mL/min — ABNORMAL LOW (ref >60)
GFR, EST NON AFRICAN AMERICAN: 34 mL/min — AB (ref >60)
Glucose, Bld: 120 mg/dL — ABNORMAL HIGH (ref 70–99)
POTASSIUM: 3.9 mmol/L (ref 3.5–5.1)
Phosphorus: 2.9 mg/dL (ref 2.5–4.6)
Sodium: 141 mmol/L (ref 135–145)

## 2018-02-18 LAB — POCT I-STAT 3, ART BLOOD GAS (G3+)
ACID-BASE EXCESS: 7 mmol/L — AB (ref 0.0–2.0)
ACID-BASE EXCESS: 3 mmol/L — AB (ref 0.0–2.0)
Acid-Base Excess: 9 mmol/L — ABNORMAL HIGH (ref 0.0–2.0)
BICARBONATE: 26.9 mmol/L (ref 20.0–28.0)
Bicarbonate: 30.3 mmol/L — ABNORMAL HIGH (ref 20.0–28.0)
Bicarbonate: 30.8 mmol/L — ABNORMAL HIGH (ref 20.0–28.0)
O2 SAT: 93 %
O2 SAT: 98 %
O2 Saturation: 96 %
PCO2 ART: 38.7 mmHg (ref 32.0–48.0)
PH ART: 7.506 — AB (ref 7.350–7.450)
PH ART: 7.556 — AB (ref 7.350–7.450)
PH ART: 7.451 — AB (ref 7.350–7.450)
PO2 ART: 65 mmHg — AB (ref 83.0–108.0)
PO2 ART: 73 mmHg — AB (ref 83.0–108.0)
PO2 ART: 97 mmHg (ref 83.0–108.0)
Patient temperature: 37.4
TCO2: 31 mmol/L (ref 22–32)
TCO2: 32 mmol/L (ref 22–32)
TCO2: 28 mmol/L (ref 22–32)
pCO2 arterial: 38.7 mmHg (ref 32.0–48.0)
pCO2 arterial: 35 mmHg (ref 32.0–48.0)

## 2018-02-18 LAB — GLUCOSE, CAPILLARY
GLUCOSE-CAPILLARY: 122 mg/dL — AB (ref 70–99)
Glucose-Capillary: 122 mg/dL — ABNORMAL HIGH (ref 70–99)
Glucose-Capillary: 122 mg/dL — ABNORMAL HIGH (ref 70–99)
Glucose-Capillary: 128 mg/dL — ABNORMAL HIGH (ref 70–99)
Glucose-Capillary: 162 mg/dL — ABNORMAL HIGH (ref 70–99)
Glucose-Capillary: 164 mg/dL — ABNORMAL HIGH (ref 70–99)

## 2018-02-18 LAB — URINALYSIS, ROUTINE W REFLEX MICROSCOPIC
BILIRUBIN URINE: NEGATIVE
Bacteria, UA: NONE SEEN
Glucose, UA: NEGATIVE mg/dL
Ketones, ur: NEGATIVE mg/dL
Leukocytes, UA: NEGATIVE
Nitrite: NEGATIVE
PH: 7 (ref 5.0–8.0)
Protein, ur: NEGATIVE mg/dL
RBC / HPF: >50 RBC/hpf — ABNORMAL HIGH (ref 0–5)
SPECIFIC GRAVITY, URINE: 1.014 (ref 1.005–1.030)

## 2018-02-18 LAB — MAGNESIUM: MAGNESIUM: 2.4 mg/dL (ref 1.7–2.4)

## 2018-02-18 LAB — PROCALCITONIN: Procalcitonin: 0.18 ng/mL

## 2018-02-18 LAB — APTT: APTT: 96 s — AB (ref 24–36)

## 2018-02-18 LAB — TRIGLYCERIDES: TRIGLYCERIDES: 100 mg/dL (ref <150)

## 2018-02-18 MED ORDER — FENTANYL 2500MCG IN NS 250ML (10MCG/ML) PREMIX INFUSION: 0.0000 ug/h | INTRAVENOUS | Status: DC

## 2018-02-18 MED ORDER — SODIUM CHLORIDE 0.9 % IV SOLN
0.0000 mg/h | INTRAVENOUS | Status: DC
  Administered 2018-02-18: 5 mg/h via INTRAVENOUS
  Administered 2018-02-18: 1 mg/h via INTRAVENOUS
  Administered 2018-02-19: 3 mg/h via INTRAVENOUS
  Filled 2018-02-18 (×3): qty 10

## 2018-02-18 MED ORDER — PIPERACILLIN-TAZOBACTAM 3.375 G IVPB
3.3750 g | Freq: Three times a day (TID) | INTRAVENOUS | Status: DC
  Administered 2018-02-18 – 2018-02-19 (×2): 3.375 g via INTRAVENOUS
  Filled 2018-02-18 (×4): qty 50

## 2018-02-18 MED ORDER — PROPOFOL 1000 MG/100ML IV EMUL
0.0000 ug/kg/min | INTRAVENOUS | Status: DC
  Administered 2018-02-18: 50 ug/kg/min via INTRAVENOUS

## 2018-02-18 MED ORDER — MIDAZOLAM BOLUS VIA INFUSION
1.0000 mg | INTRAVENOUS | Status: DC | PRN
  Administered 2018-02-18 (×2): 1 mg via INTRAVENOUS
  Administered 2018-02-19 (×2): 2 mg via INTRAVENOUS
  Administered 2018-02-19: 1 mg via INTRAVENOUS
  Administered 2018-02-20: 2 mg via INTRAVENOUS
  Administered 2018-02-20 – 2018-02-21 (×2): 1 mg via INTRAVENOUS
  Filled 2018-02-18: qty 2

## 2018-02-18 MED ORDER — FAMOTIDINE 40 MG/5ML PO SUSR
20.0000 mg | Freq: Every day | ORAL | Status: DC
  Administered 2018-02-18 – 2018-02-23 (×6): 20 mg via ORAL
  Filled 2018-02-18 (×6): qty 2.5

## 2018-02-18 MED ORDER — METHYLPREDNISOLONE SODIUM SUCC 40 MG IJ SOLR
30.0000 mg | Freq: Every day | INTRAMUSCULAR | Status: DC
  Administered 2018-02-18: 30 mg via INTRAVENOUS
  Filled 2018-02-18: qty 0.75

## 2018-02-18 MED ORDER — FENTANYL CITRATE (PF) 2500 MCG/50ML IJ SOLN
0.0000 ug/h | Status: DC
  Filled 2018-02-18: qty 100

## 2018-02-18 MED ORDER — QUETIAPINE FUMARATE 50 MG PO TABS
50.0000 mg | ORAL_TABLET | Freq: Two times a day (BID) | ORAL | Status: DC
  Administered 2018-02-18 (×2): 50 mg via ORAL
  Filled 2018-02-18 (×2): qty 1

## 2018-02-18 MED ORDER — PROPOFOL 1000 MG/100ML IV EMUL
INTRAVENOUS | Status: AC
  Filled 2018-02-18: qty 100

## 2018-02-18 MED ORDER — QUETIAPINE FUMARATE 50 MG PO TABS
50.0000 mg | ORAL_TABLET | Freq: Two times a day (BID) | ORAL | Status: DC
  Administered 2018-02-19 – 2018-02-20 (×4): 50 mg
  Filled 2018-02-18 (×7): qty 1

## 2018-02-18 MED ORDER — ACETAZOLAMIDE SODIUM 500 MG IJ SOLR
500.0000 mg | Freq: Once | INTRAMUSCULAR | Status: AC
  Administered 2018-02-18: 500 mg via INTRAVENOUS
  Filled 2018-02-18: qty 500

## 2018-02-18 MED ORDER — FENTANYL CITRATE (PF) 2500 MCG/50ML IJ SOLN
0.0000 ug/h | Status: DC
  Administered 2018-02-18: 250 ug/h via INTRAVENOUS
  Administered 2018-02-19: 100 ug/h via INTRAVENOUS
  Filled 2018-02-18 (×2): qty 100

## 2018-02-18 NOTE — Progress Notes (Signed)
ANTICOAGULATION CONSULT NOTE  Pharmacy Consult for heparin  Indication: atrial fibrillation  Patient Measurements: Height: 5\' 10"  (177.8 cm) Weight: 292 lb 12.3 oz (132.8 kg) IBW/kg (Calculated) : 73 Heparin Dosing Weight: 106 Vital Signs: Temp: 99.3 F (37.4 C) (08/23 1800) BP: 90/54 (08/23 1924) Pulse Rate: 81 (08/23 1924)  Labs: Recent Labs    02/16/18 0521 02/16/18 3818  02/17/18 0330  02/17/18 0539 02/17/18 1816 02/17/18 2004 02/18/18 0416 02/18/18 1842  HGB 12.8*  --   --  11.7*  --   --   --   --  13.5  --   HCT 39.5  --   --  36.9*  --   --   --   --  42.2  --   PLT 639*  --   --  579*  --   --   --   --  575*  --   APTT  --  66*   < > >200*   < >  --  >200* 106*  --  96*  HEPARINUNFRC  --  1.86*  --  >2.20*  --  1.76*  --   --   --   --   CREATININE 2.06*  --   --  1.49*  --   --   --   --  1.83*  --    < > = values in this interval not displayed.   Assessment: 77 yo male with admitted for pneumonia, on apixaban PTA for Afib and history of PE. Apixaban has been held, he has been switched to heparin while inpatient. Heparin levels still appear to be effected by previous Eliquis use.  Heparin infusion was changed to a peripheral IV and therefore should no longer be effecting aPTTs. This evening's aPTT is  therapeutic.   Goal of Therapy:  APTT Goal 66-102 Heparin level 0.3-0.7 units/ml Monitor platelets by anticoagulation protocol: Yes    Plan:  Continue heparin gtt at 2400 units/hr Next level with Am labs  Harvel Quale 02/18/2018 7:46 PM

## 2018-02-18 NOTE — Progress Notes (Signed)
eLink Physician-Brief Progress Note Patient Name: DANIEL RITTHALER DOB: 1941/02/10 MRN: [id number]   Date of Service  02/18/2018  HPI/Events of Note  Hematuria - Patient is on Heparin IV infusion for Hx of PE.   eICU Interventions  Will order: 1. Place SCD's. 2. Hold Heparin IV infusions for 4 hours. If hematuria clears, attempt to restart Heparin IV infusion.      Intervention Category Major Interventions: Other:  Travares Nelles Cornelia Copa 02/18/2018, 2:42 AM

## 2018-02-18 NOTE — Plan of Care (Signed)
  Problem: Health Behavior/Discharge Planning: Goal: Ability to manage health-related needs will improve Outcome: Progressing   Problem: Clinical Measurements: Goal: Will remain free from infection Outcome: Progressing Goal: Diagnostic test results will improve Outcome: Progressing   Problem: Nutrition: Goal: Adequate nutrition will be maintained Outcome: Progressing   Problem: Coping: Goal: Level of anxiety will decrease Outcome: Progressing

## 2018-02-18 NOTE — Progress Notes (Signed)
Notified by RN of two different attempts of titration for adequate sedation however keeps dropping blood pressure.  P:  D/c propofol Start versed gtt per PAD protocol   Kennieth Rad, AGACNP-BC [place] [facility] Pgr: [phone] or if no answer [phone] 02/18/2018, 12:59 PM

## 2018-02-18 NOTE — Telephone Encounter (Signed)
See note

## 2018-02-18 NOTE — Telephone Encounter (Signed)
Copied from [place] [phone] [id number]. Topic: Quick Communication - See Telephone Encounter >> Feb 18, 2018  4:45 PM Blase Mess A wrote: CRM for notification. See Telephone encounter for: 02/18/18. Patients son called, that patient has been in the the hospital for over 3 weeks in ICU family does not expect patient to make it.

## 2018-02-18 NOTE — Progress Notes (Signed)
Pt has bloody/blood-tinged urine; Bright blood noted in foley tubing. MD notified and instructed to hold Heparin IV for four hours and place SCD's.

## 2018-02-18 NOTE — Progress Notes (Addendum)
PULMONARY / CRITICAL CARE MEDICINE   Name: Wayne Booth MRN: [id number] DOB: Feb 18, 1941    ADMISSION DATE:  02/02/2018 CONSULTATION DATE: 8/17/201  CHIEF COMPLAINT: Respiratory extremis   Brief 77 year old with a history of atrial fibrillation and diastolic dysfunction who presented 02/02/2018 with right-sided pleuritic chest pain fever and dyspnea.  He was treated with an empiric combination of azithromycin and cefepime and at the same time was diuresed for diastolic failure.  Overnight 02/11/18  he has had increasing dyspnea with midline chest pain is not clear whether or not this is been pleuritic; no radiation, no productive cough.  Patchy infiltrates on the CT scan of the chest obtained on 8/18 1 of them up appears consistent with a peripheral nodule.  He has a history of both prostate cancer and renal cancer and reported both of these have undergone curative treatment.  SUBJECTIVE/OVERNIGHT/INTERVAL HX Tmax 101.7, WBC up 24 Ongoing unprovoked agitation overnight (wakes up, panics, desaturates, tachypneic), on fentanyl 375, precedex 1.7, w/additional 12mg  of versed pushes -1.2L after switching to daily lasix, sCr up 1.49 ->1.83 hematuria- heparin being held till urine clears Reported copious secretions overnight, Minimal this am  VITAL SIGNS: BP (!) 103/59   Pulse 83   Temp (!) 101.5 F (38.6 C)   Resp (!) 22   Ht 5\' 10"  (1.778 m)   Wt 132.8 kg   SpO2 95%   BMI 42.01 kg/m   HEMODYNAMICS: CVP:  [10 mmHg-12 mmHg] 12 mmHg  VENTILATOR SETTINGS: Vent Mode: PRVC FiO2 (%):  [30 %-55 %] 40 % Set Rate:  [12 bmp-18 bmp] 18 bmp Vt Set:  [600 mL] 600 mL PEEP:  [5 cmH20-8 cmH20] 5 cmH20 Plateau Pressure:  [10 cmH20-29 cmH20] 23 cmH20  INTAKE / OUTPUT: I/O last 3 completed shifts: In: 4354.6 [I.V.:2171; Other:10; NG/GT:1235.5; IV Piggyback:938.2] Out: 8705 [Urine:8705]  PHYSICAL EXAMINATION:  General:  Critically ill male in NAD, sedate on MV HEENT: MM pink/dry, pupils  3/reactive, 7.5 ETT at 24.5 at lip, OGT Neuro: sedated, not f/c CV:  rrr, no m/r/g, +1 distal pulses PULM: even/non-labored on MV, breathing at set rate 18, lungs bilaterally coarse, bibasilar rales GI: obese, remains softer, +BS  Extremities: cool (core warm) /dry, trace BLE edema  Skin: no rashes  PULMONARY Recent Labs  Lab 02/12/18 1536 02/13/18 0337 02/15/18 0355 02/16/18 0351 02/17/18 0355 02/18/18 0406  PHART 7.332* 7.292* 7.395 7.444 7.533* 7.506*  PCO2ART 42.7 47.8 44.5 42.7 38.2 38.7  PO2ART 249.0* 69.0* 60.5* 74.0* 53.5* 65.0*  HCO3 22.6 23.1 26.6 29.2* 32.0* 30.3*  TCO2 24 25  --  30  --  31  O2SAT 100.0 91.0 89.9 95.0 88.7 93.0   CBC Recent Labs  Lab 02/16/18 0521 02/17/18 0330 02/18/18 0416  HGB 12.8* 11.7* 13.5  HCT 39.5 36.9* 42.2  WBC 20.7* 20.7* 24.0*  PLT 639* 579* 575*   COAGULATION Recent Labs  Lab 02/14/18 0555  INR 2.04   CARDIAC  No results for input(s): TROPONINI in the last 168 hours. No results for input(s): PROBNP in the last 168 hours.  CHEMISTRY Recent Labs  Lab 02/14/18 0010  02/14/18 1603 02/15/18 0419 02/16/18 0521 02/17/18 0330 02/18/18 0416  NA 131*  --   --  135 137 139 141  K 4.7  --   --  4.8 4.3 3.1* 3.9  CL 96*  --   --  101 100 103 101  CO2 25  --   --  27 30 28  32  GLUCOSE 152*  --   --  150* 164* 107* 120*  BUN 52*  --   --  68* 85* 69* 78*  CREATININE 2.47*  --   --  2.32* 2.06* 1.49* 1.83*  CALCIUM 8.3*  --   --  8.1* 8.1* 6.7* 8.5*  MG  --    < > 2.9* 2.8* 2.7* 1.7 2.4  PHOS  --    < > 4.2 4.6 4.6 2.4* 2.9   < > = values in this interval not displayed.   Estimated Creatinine Clearance: 47.8 mL/min (A) (by C-G formula based on SCr of 1.83 mg/dL (H)).  LIVER Recent Labs  Lab 02/14/18 0555 02/17/18 0330 02/18/18 0416  AST 147* 50*  --   ALT 294* 142*  --   ALKPHOS 84 56  --   BILITOT 1.1 0.8  --   PROT 6.6 5.1*  --   ALBUMIN 2.1* 1.8* 2.3*  INR 2.04  --   --    INFECTIOUS Recent Labs  Lab  02/12/18 1332 02/12/18 1647 02/13/18 0512 02/14/18 0001 02/14/18 0555 02/17/18 0330  LATICACIDVEN 2.9* 2.8*  --  1.2  --   --   PROCALCITON 0.43  --  1.10  --  0.69 0.17   ENDOCRINE CBG (last 3)  Recent Labs    02/17/18 2333 02/18/18 0417 02/18/18 0721  GLUCAP 120* 122* 122*   IMAGING x48h  - image(s) personally visualized  -   highlighted in bold Dg Chest Port 1 View  Result Date: 02/18/2018 CLINICAL DATA:  Respiratory failure. EXAM: PORTABLE CHEST 1 VIEW COMPARISON:  02/17/2018. FINDINGS: Right PICC line noted with tip over cavoatrial junction. Endotracheal tube and NG tube in stable position. Cardiomegaly with diffuse bilateral pulmonary infiltrates/edema bilateral pleural effusions. Findings consistent CHF. No pneumothorax. IMPRESSION: 1. Right PICC line noted with tip at cavoatrial junction. Endotracheal tube and NG tube in stable position. 2. Cardiomegaly with diffuse bilateral pulmonary infiltrates/edema and bilateral pleural effusions. These findings have progressed from prior exam. Electronically Signed   By: Marcello Moores  Register   On: 02/18/2018 06:22   Dg Chest Port 1 View  Result Date: 02/17/2018 CLINICAL DATA:  Encounter for endotracheal tube placement EXAM: PORTABLE CHEST 1 VIEW COMPARISON:  Earlier today FINDINGS: Retracted endotracheal tube with tip now between the clavicular heads and carina. An orogastric tube reaches the stomach. Right upper extremity PICC with tip at the SVC. Haziness of the lower chest from atelectasis and layering effusions most likely. There is no Kerley lines or pneumothorax. Chronic cardiomegaly and vascular pedicle widening IMPRESSION: 1. Repositioned endotracheal tube now in good position. 2. Otherwise stable. Electronically Signed   By: Monte Fantasia M.D.   On: 02/17/2018 14:43   Dg Chest Port 1 View  Result Date: 02/17/2018 CLINICAL DATA:  Intubation, orogastric tube placement EXAM: PORTABLE CHEST 1 VIEW COMPARISON:  Portable exam 1422 hours  compared to 0453 hours FINDINGS: Tip of endotracheal tube projects over proximal RIGHT mainstem bronchus recommend withdrawal 2.5 cm. Tip of orogastric tube extends into stomach below extent of chest x-ray. Enlargement of cardiac silhouette with vascular congestion. Atherosclerotic calcification aorta. Persistent pulmonary infiltrates in the mid to lower lungs. No pneumothorax. IMPRESSION: Tip of endotracheal tube projects over proximal RIGHT mainstem bronchus; recommend withdrawal 2.5 cm. Persistent infiltrates bilaterally. Findings called to [facility] in 24M MICU on 02/17/2018 at 1442 hours. Electronically Signed   By: Lavonia Dana M.D.   On: 02/17/2018 14:43   Dg Chest Port 1 View  Result Date: 02/17/2018 CLINICAL DATA:  Shortness of  breath, check endotracheal tube placement EXAM: PORTABLE CHEST 1 VIEW COMPARISON:  02/16/2018 FINDINGS: Endotracheal tube appears to have been advanced and now lies at the level of the carina directed towards the right mainstem bronchus. This should be withdrawn approximately 3 cm. Nasogastric catheter and right-sided PICC line are stable. Cardiac enlargement is again identified. Persistent bibasilar infiltrates left greater than right are noted with associated small effusions. No bony abnormality is noted. IMPRESSION: Stable appearance of the chest with the exception of interval apparent advancement of the endotracheal tube to the level of the carina now directed towards the right mainstem bronchus. This should be withdrawn approximately 3 cm. These results will be called to the ordering clinician or representative by the Radiologist Assistant, and communication documented in the PACS or zVision Dashboard. Electronically Signed   By: Inez Catalina M.D.   On: 02/17/2018 06:55   LINES/TUBES: ETT 8/17>>> R PICC 8/17>>>  CULTURES: 8/7 BC x 2 >> neg 8/8 MRSA PCR >> negatve 8/8 sputum cx >> neg 8/17 MRSA PCR >> positive  8/17 trach asp cx >> few MRSA 8/17 BCx2 >> 8/18 UC >>  neg 8/20 trach asp >> MRSA 8/23 trach asp>> 8/23 BCx2 >> 8/23 UA >>  ANTIBIOTICS: 8/7 vanc >> 8/8; 8/17 >> 8/7 cefepime >> 8/15 8/11 azithromycin >> 8/17 >> 8/21 8/17 zosyn >> 8/20; 8/23 >> Rocephin 8/20>>>8/21  EVENTS 02/02/2018 - admit 8/8 - CT CT with new findings of Bilateral LL consolidation and RML and RUL infilrrates compated to  April 2018 8/17 - tx ICU and intubation; Vancomycin and Zosyn started 8/17. Had new onset fever since admit  with rising WBC 20K.  cxr - stable iniflrtares PICC placed. 8/18 - Needing 60% fio2./peep 5 - fio2 worsening overnight. On fent gtt but oriented. Has trazazone  prn , xanax prn and lexapro scheduled , pamelor schedule, and sovorexant for insomnia .   On levophed 59mcg. Also on eliquis,  amio tab, cardiszem tab and lopressor table. Noted  allopurinol and colchicine; s/p 2 doses solumedrol yesterday.  Sons and daughter in law  rerport - significant diuresis in medical floor with fluid restriction.  Low NA worse to 129. AKI  worse 1.6 -> 2.6 . Also report dusky toes 8/21 Failed SBT this am x 2- no effort per pt.  RN reports patient goes from being completely  sedated to then wild, biting, and trying to pull at lines. Large BM yesterday after enema.  Was  on precedex 1.2 and fentanyl 350 this am, now tolerating precedex at 1 and fentanyl 150  Mcg/hr Neg 4L/ net -10L and at admit weight; Lasix gtt was held overnight and restarted at  7am 8/22  Ongoing agitation requiring fentanyl gtt 300 mcg/hr and precedex 1.2 with prn versed pushes  every 2-3 hours  (bitting tube, failing arms, reaching for tubes).  2 bowel movements  overnight Diuresed -5.4 L on lasix gtt at 5.  CXR this am notes ETT in right mainstem.   Extubated, failed bipap and required reintubation after ~2 hrs  DISCUSSION:      This is an obese gentleman with a history of sleep apnea, atrial fibrillation s/p ablation, history of transsphenoidal resection, prostate cancer with seed placement, cancer of  the kidney status post left nephrectomy and nephrolithiasis who presented on 8/8 with right sided pleuritic chest pain.  He also has a history of diastolic heart failure.  CTA showed patchy infiltrates on the right and no PE.  He was empirically treated with  a combination of azithromycin and cefepime but developed acute dyspnea 8/17 requiring intubation.  He has been diuresed.  Sputum cx showing few MRSA PNA being treated with vanc.  Ongoing agitation has been a barrier to weaning.  Failed attempt at extubation 8/22.    ASSESSMENT / PLAN:  PULMONARY A:  Acute hypoxic Resp Failure  HCAP- MRSA PNA Hx OSA - day 7/x of ETT - possible new aspiration PNA, CXR today w/ stable lines, but now with diffuse bilateral inflitrates/ and pleural effusions  - alkalotic on ABG  Plan: Hold SBT today Continue full MV support Repeat ABG while pt at rr 18, verifies ongoing alkalosis, therefore vent rate dropped to 16 Will recheck ABG 4 hours after vent changes and diamox dose FiO2/ peep goal for sats 88-92%,  Pepcid for SUP, changed to PO to decrease volume See below for ID  Pulmicort BID, duonebs prn q4   CARDIOVASCULAR A:  Diastolic HF Shock - possibly septic +/- cardiogenic , resolved; off pressors since 8/19 Hx Afib on prior eliquis, CAD, HTN - TTE 8/9 LVEF 50-55%; technically difficult study; no clear wall motion abnormalities Plan: Tele monitor Goal MAP > 65 Continue amiodarone 200 mg daily Holding cardizem and lopressor ( SR maintaining 58-70's while on precedex)  Will resume heparin gtt per pharm as urine now is clear, will need to hold if hematuria returns Hold lasix  RENAL A:  AKI on CKD; hx of solitary kidney Mixed alkalosis  - sCr worse today, -1.2L/ 24, good UOP Plan:  Diamox 500mg  x 1  Trend BMP/ mag/ phos / urinary output/ daily wts Replace electrolytes as indicated  GASTROINTESTINAL A:  NPO Constipation resolved, s/p BM 8/20, 8/21 Mild Transaminitis  Plan:  TF at goal  60 ml/hr Colace BID  dulcolax prn  Trend LFTs  HEMATOLOGIC  A: Chronically on Eliquis  (Estimated Creatinine Clearance: 47.8 mL/min (A) (by C-G formula based on SCr of 1.83 mg/dL (H)). )  Recent Labs  Lab 02/14/18 0010 02/15/18 0419 02/16/18 0521 02/17/18 0330 02/18/18 0416  CREATININE 2.47* 2.32* 2.06* 1.49* 1.83*  P Resume Heparin drip per pharmacy Monitor for further hematuria, no other bleeding noted Will transition back to Eliquis once extubated and taking PO  INFECTIOUS A:  MRSA PNA ?new infectious source given dramatic change in CXR with fever, worsening leukocytosis  Plan Continue vanc for goal of 7 days Add on PCT level this am, recheck in am Pan culture Check UA Add empiric zosyn Cooling blanket for fever  Trend WBC/ fever curve   ENDOCRINE A:  Hx of transsphenoidal resection and thyroid replacement  Plan:  Continue synthroid daily Stop solumedrol Trend CBGs, add SSI if glucose > 180 x 2  NEUROLOGIC A:   Delirium -ongoing - home trazazone prn, xanax prn and lexapro scheduled , pamelor schedule, and sovorexant for insomnia  - do not think precedex is helping at this point, given additional prn versed requirements  Plan D/c precedex and versed prn  Add propofol and monitor triglyceride, if this doesn't work, ? Trying versed gtt Continue fentanyl gtt (quad strength to limit fluids) Continue klonopin 1mg  BID Add seroquel 50 mg BID, daily monitoring of QTc (today is 0.41) Bowel regimen with PAD protocol  Continue pamelol and lexapro Holding xanax, trazodone, sovorrexant  MSK A: Gout Plan:  Hold allopurinil Holding colchicine empirically, as sCr is elevated today  FAMILY Wife updated at length yesterday.  Today family is in/out, will update them when they come back.  CCT 60 mins  Kennieth Rad, AGACNP-BC [facility] Pgr: [phone] or if no answer [phone] 02/18/2018, 8:31 AM  Attending Note:  77 year old male  with CHF, pulmonary edema, HCAP and respiratory failure presenting to PCCM for vent management.  Overnight, patient was very agitated and propofol was started.  However, BP deteriorated rapidly and that was stopped.  On exam, coarse BS diffusely.  I reviewed CXR myself, pulmonary infiltrate noted.  Discussed with PCCM-NP.  Spoke with family, patient's renal function has deteriorated, so will stop diuretics as this point.  Fever and WBC overnight, will pan culture and add zosyn to vancomycin.  Spoke with the family at length, they are discussing trach/peg, if there is no further improvement by Monday then will proceed with trach.  The patient is critically ill with multiple organ systems failure and requires high complexity decision making for assessment and support, frequent evaluation and titration of therapies, application of advanced monitoring technologies and extensive interpretation of multiple databases.   Critical Care Time devoted to patient care services described in this note is  37  Minutes. This time reflects time of care of this signee Dr [name]. This critical care time does not reflect procedure time, or teaching time or supervisory time of PA/NP/Med student/Med Resident etc but could involve care discussion time.  Rush Farmer, M.D. [facility] Pulmonary/Critical Care Medicine. Pager: [phone]. After hours pager: [phone].

## 2018-02-18 NOTE — Progress Notes (Signed)
Continue to attempt titrating propofol for adequate sedation to manage respiratory status and maintain adequate hemodynamics. Have not been able to attain balance and notified NP. New orders received.

## 2018-02-18 NOTE — Progress Notes (Signed)
Pt has new temp of 101.7 despite cool cloth and fan. MD notified and orders for cooling blanket placed.

## 2018-02-18 NOTE — Progress Notes (Signed)
eLink Physician-Brief Progress Note Patient Name: ONDRE SALVETTI DOB: [birth year] MRN: [id number]   Date of Service  02/18/2018  HPI/Events of Note  Fever to 101.7 F - Already on Vancomycin for MRSA pneumonia. AST and ALT both elevated, therefore, can't use Tylenol. Creatinine now down from 2.54 --> 1.49, therefore, reluctant to use Motrin.   eICU Interventions  Will order: 1. Ice packs and cooling blanket PRN.      Intervention Category Major Interventions: Other:  Tess Potts Cornelia Copa 02/18/2018, 2:15 AM

## 2018-02-18 NOTE — Progress Notes (Addendum)
[place] NOTE  Pharmacy Consult for heparin  Indication: atrial fibrillation   Pharmacy Consult for Zosyn and Vancomycin Indication: Aspiration Pneumonia  Patient Measurements: Height: 5\' 10"  (177.8 cm) Weight: 292 lb 12.3 oz (132.8 kg) IBW/kg (Calculated) : 73 Heparin Dosing Weight: 106 Vital Signs: Temp: 100 F (37.8 C) (08/23 1415) Temp Source: Oral (08/23 0400) BP: 86/52 (08/23 1415) Pulse Rate: 81 (08/23 1415)  Labs: Recent Labs    02/16/18 0521 02/16/18 7425  02/17/18 0330 02/17/18 0537 02/17/18 0539 02/17/18 1816 02/17/18 2004 02/18/18 0416  HGB 12.8*  --   --  11.7*  --   --   --   --  13.5  HCT 39.5  --   --  36.9*  --   --   --   --  42.2  PLT 639*  --   --  579*  --   --   --   --  575*  APTT  --  66*   < > >200* 56*  --  >200* 106*  --   HEPARINUNFRC  --  1.86*  --  >2.20*  --  1.76*  --   --   --   CREATININE 2.06*  --   --  1.49*  --   --   --   --  1.83*   < > = values in this interval not displayed.   Assessment: 77 yo male with admitted for pneumonia, on apixaban PTA for Afib and history of PE. Holding apixaban and starting heparin for anticoagulation. Heparin levels appear to still be effected by previous Eliquis use. Last  aPTT yesterday was just above goal at 106. Heparin gtt held overnight due to hematuria. Per nurse this morning, the bleeding was thought to be due to trauma so restarted heparin gtt at former rate today. H&H WNL. Plts 575.  Patient originally started on vancomycin and Zosyn on 8/17, Zosyn was descalated to ceftriaxone on 8/20, but stopped ceftriaxone on 8/21 due to MRSA sputum culture result. Restarting Zosyn due for concern of aspiriation PNA secondary to a failed extubation trial yesterday. Patient has an AKI with a sCr 1.83 but has good UOP on lasix.  WBC increased to 24. Tmax 101.5.   Antimicrobials this admission: Vancomycin 8/7>>8/8; 8/17 >> Cefepime 8/7>>(8/16); 8/17 >>8/17 (not given) Zosyn  8/17 >8/20; 8/23 >> CTX 8/20 >>8/21  Dose adjustments this admission: 8/17 Vancomycin 2000mg x1 then 1750mg  Q48H (8/22) Vancomycin from 1750mg  Q48H to 1500mg  Q24H  Microbiology results: 8/8: MRSA PCR >> neg 8/7 blood>>neg 8/8 Sputum negative 8/8 Resp - Mod GPC > nrf 8/17 BCx - ngtd 8/17 mrsa pcr + 8/17 sputum - MRSA 8/20 sputum - MRSA  Anticoagulation Goal of Therapy:  APTT Goal 66-102 Heparin level 0.3-0.7 units/ml Monitor platelets by anticoagulation protocol: Yes   Plan:  Restart heparin gtt at 2400 units/hr 8hr APTT Daily Heparin level, aPPT, CBC  Vancomycin 1500mg  IV q24h Zosyn 3.375 IV  Q8h Monitor clinical picture, renal function, vanc trough at steady state F/U C&S, abx deescalation / LOT  Thank you for allowing pharmacy to be a part of this patient's care.  Harrietta Guardian, PharmD PGY1 Pharmacy Resident Direct Phone [phone] 02/18/2018    3:30 PM   I discussed / reviewed the pharmacy note by Dr. Charlena Cross and I agree with the resident's findings and plans as documented. Hematuria has resolved.  Sloan Leiter, PharmD, BCPS, BCCCP Clinical Pharmacist Clinical phone 02/18/2018 until 3:30PM [phone] After hours, please  call [phone] 02/18/2018, 5:31 PM

## 2018-02-19 ENCOUNTER — Inpatient Hospital Stay (HOSPITAL_COMMUNITY): Payer: Medicare Other

## 2018-02-19 DIAGNOSIS — R918 Other nonspecific abnormal finding of lung field: Secondary | ICD-10-CM

## 2018-02-19 DIAGNOSIS — Z9911 Dependence on respirator [ventilator] status: Secondary | ICD-10-CM

## 2018-02-19 DIAGNOSIS — Z86711 Personal history of pulmonary embolism: Secondary | ICD-10-CM

## 2018-02-19 DIAGNOSIS — I4891 Unspecified atrial fibrillation: Secondary | ICD-10-CM

## 2018-02-19 DIAGNOSIS — Z888 Allergy status to other drugs, medicaments and biological substances status: Secondary | ICD-10-CM

## 2018-02-19 DIAGNOSIS — Z7189 Other specified counseling: Secondary | ICD-10-CM

## 2018-02-19 DIAGNOSIS — Z87891 Personal history of nicotine dependence: Secondary | ICD-10-CM

## 2018-02-19 DIAGNOSIS — D72829 Elevated white blood cell count, unspecified: Secondary | ICD-10-CM

## 2018-02-19 LAB — GLUCOSE, CAPILLARY
GLUCOSE-CAPILLARY: 131 mg/dL — AB (ref 70–99)
GLUCOSE-CAPILLARY: 127 mg/dL — AB (ref 70–99)
GLUCOSE-CAPILLARY: 101 mg/dL — AB (ref 70–99)
Glucose-Capillary: 141 mg/dL — ABNORMAL HIGH (ref 70–99)
Glucose-Capillary: 130 mg/dL — ABNORMAL HIGH (ref 70–99)

## 2018-02-19 LAB — BLOOD GAS, ARTERIAL
ACID-BASE EXCESS: 3 mmol/L — AB (ref 0.0–2.0)
BICARBONATE: 27.3 mmol/L (ref 20.0–28.0)
DRAWN BY: 511911
FIO2: 40
MECHVT: 600 mL
O2 SAT: 95.6 %
PEEP/CPAP: 5 cmH2O
PH ART: 7.409 (ref 7.350–7.450)
Patient temperature: 98.6
RATE: 16 resp/min
pCO2 arterial: 44.1 mmHg (ref 32.0–48.0)
pO2, Arterial: 87.5 mmHg (ref 83.0–108.0)

## 2018-02-19 LAB — CBC WITH DIFFERENTIAL/PLATELET
BASOS ABS: 0 10*3/uL (ref 0.0–0.1)
BASOS PCT: 0 %
EOS ABS: 0.3 10*3/uL (ref 0.0–0.7)
Eosinophils Relative: 1 %
HCT: 42.4 % (ref 39.0–52.0)
HEMOGLOBIN: 13.1 g/dL (ref 13.0–17.0)
LYMPHS PCT: 7 %
Lymphs Abs: 1.8 10*3/uL (ref 0.7–4.0)
MCH: 28.7 pg (ref 26.0–34.0)
MCHC: 30.9 g/dL (ref 30.0–36.0)
MCV: 93 fL (ref 78.0–100.0)
MONO ABS: 2 10*3/uL — AB (ref 0.1–1.0)
Monocytes Relative: 8 %
NEUTROS PCT: 84 %
Neutro Abs: 21.2 10*3/uL — ABNORMAL HIGH (ref 1.7–7.7)
PLATELETS: 478 10*3/uL — AB (ref 150–400)
RBC: 4.56 MIL/uL (ref 4.22–5.81)
RDW: 15.2 % (ref 11.5–15.5)
WBC: 25.3 10*3/uL — ABNORMAL HIGH (ref 4.0–10.5)

## 2018-02-19 LAB — COMPREHENSIVE METABOLIC PANEL
ALK PHOS: 90 U/L (ref 38–126)
ALT: 225 U/L — AB (ref 0–44)
ANION GAP: 8 (ref 5–15)
AST: 140 U/L — ABNORMAL HIGH (ref 15–41)
Albumin: 2.3 g/dL — ABNORMAL LOW (ref 3.5–5.0)
BUN: 67 mg/dL — ABNORMAL HIGH (ref 8–23)
CALCIUM: 8.1 mg/dL — AB (ref 8.9–10.3)
CO2: 28 mmol/L (ref 22–32)
CREATININE: 1.65 mg/dL — AB (ref 0.61–1.24)
Chloride: 106 mmol/L (ref 98–111)
GFR calc Af Amer: 45 mL/min — ABNORMAL LOW (ref >60)
GFR, EST NON AFRICAN AMERICAN: 39 mL/min — AB (ref >60)
Glucose, Bld: 138 mg/dL — ABNORMAL HIGH (ref 70–99)
Potassium: 3.7 mmol/L (ref 3.5–5.1)
SODIUM: 142 mmol/L (ref 135–145)
Total Bilirubin: 1.6 mg/dL — ABNORMAL HIGH (ref 0.3–1.2)
Total Protein: 6.2 g/dL — ABNORMAL LOW (ref 6.5–8.1)

## 2018-02-19 LAB — PROCALCITONIN: PROCALCITONIN: 0.18 ng/mL

## 2018-02-19 LAB — HEPATIC FUNCTION PANEL
ALBUMIN: 2.2 g/dL — AB (ref 3.5–5.0)
ALK PHOS: 94 U/L (ref 38–126)
ALT: 249 U/L — ABNORMAL HIGH (ref 0–44)
AST: 157 U/L — AB (ref 15–41)
BILIRUBIN DIRECT: 0.8 mg/dL — AB (ref 0.0–0.2)
BILIRUBIN TOTAL: 1.6 mg/dL — AB (ref 0.3–1.2)
Indirect Bilirubin: 0.8 mg/dL (ref 0.3–0.9)
Total Protein: 6.5 g/dL (ref 6.5–8.1)

## 2018-02-19 LAB — HEPARIN LEVEL (UNFRACTIONATED)
Heparin Unfractionated: 1.8 IU/mL — ABNORMAL HIGH (ref 0.30–0.70)
Heparin Unfractionated: 1.44 IU/mL — ABNORMAL HIGH (ref 0.30–0.70)

## 2018-02-19 LAB — MAGNESIUM: MAGNESIUM: 2.3 mg/dL (ref 1.7–2.4)

## 2018-02-19 LAB — APTT
APTT: 139 s — AB (ref 24–36)
aPTT: 96 seconds — ABNORMAL HIGH (ref 24–36)

## 2018-02-19 MED ORDER — POTASSIUM CHLORIDE 20 MEQ/15ML (10%) PO SOLN
40.0000 meq | Freq: Three times a day (TID) | ORAL | Status: AC
  Administered 2018-02-19 (×2): 40 meq
  Filled 2018-02-19 (×2): qty 30

## 2018-02-19 MED ORDER — ACETAZOLAMIDE SODIUM 500 MG IJ SOLR
500.0000 mg | Freq: Once | INTRAMUSCULAR | Status: DC
  Filled 2018-02-19: qty 500

## 2018-02-19 MED ORDER — METOLAZONE 5 MG PO TABS
5.0000 mg | ORAL_TABLET | Freq: Every day | ORAL | Status: AC
  Administered 2018-02-19: 5 mg via ORAL
  Filled 2018-02-19: qty 1

## 2018-02-19 MED ORDER — HEPARIN (PORCINE) IN NACL 100-0.45 UNIT/ML-% IJ SOLN
2200.0000 [IU]/h | INTRAMUSCULAR | Status: DC
  Administered 2018-02-19 – 2018-02-21 (×4): 2200 [IU]/h via INTRAVENOUS
  Filled 2018-02-19 (×4): qty 250

## 2018-02-19 MED ORDER — STERILE WATER FOR INJECTION IJ SOLN
INTRAMUSCULAR | Status: AC
  Administered 2018-02-20: 10 mL
  Filled 2018-02-19: qty 10

## 2018-02-19 MED ORDER — SODIUM CHLORIDE 0.9 % IV SOLN
600.0000 mg | Freq: Two times a day (BID) | INTRAVENOUS | Status: DC
  Administered 2018-02-19 (×2): 600 mg via INTRAVENOUS
  Filled 2018-02-19 (×3): qty 600

## 2018-02-19 MED ORDER — FUROSEMIDE 10 MG/ML IJ SOLN
8.0000 mg/h | INTRAVENOUS | Status: DC
  Administered 2018-02-19: 8 mg/h via INTRAVENOUS
  Filled 2018-02-19: qty 25

## 2018-02-19 MED ORDER — ACETAZOLAMIDE SODIUM 500 MG IJ SOLR
250.0000 mg | Freq: Four times a day (QID) | INTRAMUSCULAR | Status: AC
  Administered 2018-02-19 – 2018-02-20 (×3): 250 mg via INTRAVENOUS
  Filled 2018-02-19 (×2): qty 250

## 2018-02-19 NOTE — Progress Notes (Signed)
ANTICOAGULATION CONSULT NOTE  Pharmacy Consult for Heparin  Indication: atrial fibrillation  Patient Measurements: Height: 5\' 10"  (177.8 cm) Weight: 279 lb 12.2 oz (126.9 kg) IBW/kg (Calculated) : 73 Heparin Dosing Weight: 106 Vital Signs: Temp: 98.8 F (37.1 C) (08/24 1400) Temp Source: Bladder (08/24 0830) BP: 138/77 (08/24 1550) Pulse Rate: 110 (08/24 1550)  Labs: Recent Labs    02/17/18 0330  02/17/18 0539  02/18/18 0416 02/18/18 1842 02/19/18 0503 02/19/18 1623  HGB 11.7*  --   --   --  13.5  --  13.1  --   HCT 36.9*  --   --   --  42.2  --  42.4  --   PLT 579*  --   --   --  575*  --  478*  --   APTT >200*   < >  --    < >  --  96* 139* 96*  HEPARINUNFRC >2.20*  --  1.76*  --   --   --  1.80*  --   CREATININE 1.49*  --   --   --  1.83*  --  1.65*  --    < > = values in this interval not displayed.   Assessment: 73 yoM admitted for PNA, on apixaban PTA for h/o afib and PE. Apixaban has been held (last dose 8/18) and switched to IV heparin. Heparin levels and aPTTs now close to correlating. aPTT within goal range at 96 following heparin held x1 hr and rate decrease. No bleeding noted. Hgb stable WNL, pltc 478.  Goal of Therapy:  aPTT Goal 66-102 Heparin level 0.3-0.7 units/ml Monitor platelets by anticoagulation protocol: Yes   Plan:  Continue IV heparin gtt @ 2200 units/hr Daily heparin level/aPTT and CBC Can likely d/c aPTTs in AM Monitor s/sx of bleeding  Jaye Polidori N. Gerarda Fraction, PharmD PGY2 Infectious Diseases Pharmacy Resident Phone: [phone] 02/19/18  5:03 PM

## 2018-02-19 NOTE — Progress Notes (Signed)
ANTICOAGULATION CONSULT NOTE  Pharmacy Consult for heparin  Indication: atrial fibrillation  Patient Measurements: Height: 5\' 10"  (177.8 cm) Weight: 279 lb 12.2 oz (126.9 kg) IBW/kg (Calculated) : 73 Heparin Dosing Weight: 106 Vital Signs: Temp: 98.6 F (37 C) (08/24 0500) Temp Source: Bladder (08/24 0400) BP: 111/63 (08/24 0500) Pulse Rate: 92 (08/24 0500)  Labs: Recent Labs    02/17/18 0330  02/17/18 0539  02/17/18 2004 02/18/18 0416 02/18/18 1842 02/19/18 0503  HGB 11.7*  --   --   --   --  13.5  --  13.1  HCT 36.9*  --   --   --   --  42.2  --  42.4  PLT 579*  --   --   --   --  575*  --  478*  APTT >200*   < >  --    < > 106*  --  96* 139*  HEPARINUNFRC >2.20*  --  1.76*  --   --   --   --  1.80*  CREATININE 1.49*  --   --   --   --  1.83*  --  1.65*   < > = values in this interval not displayed.   Assessment: 77 yo male with admitted for pneumonia, on apixaban PTA for Afib and history of PE. Apixaban has been held, he has been switched to heparin while inpatient. Heparin levels still appear to be effected by previous Eliquis use.  Heparin level and aPTT this AM are elevated and both seem to be correlating. Discussed with RN. Heparin infusing via peripheral line and level was drawn via PICC line. This likely represents a true level. No bleeding noted per RN. H/H wnl. Plt 478k. SCr 1.65. LFTs trending up.    Goal of Therapy:  APTT Goal 66-102 Heparin level 0.3-0.7 units/ml Monitor platelets by anticoagulation protocol: Yes    Plan:  Hold heparin x 1 hour, then resume IV heparin at 2200 units/hr  F/u 8 hr HL/aPTT  Albertina Parr, PharmD., BCPS Clinical Pharmacist Clinical phone for 02/19/18 until 8AM: [phone]

## 2018-02-19 NOTE — Consult Note (Signed)
[facility]       Reason for Consult: leukocytosis    Referring Physician: Dr. Nelda Marseille  Active Problems:   History of renal cell carcinoma   Essential hypertension   OSA (obstructive sleep apnea)   Hypothyroidism   History of pulmonary embolism   Paroxysmal atrial fibrillation (HCC)   Acute respiratory failure with hypoxia (HCC)   HCAP (healthcare-associated pneumonia)   Pericardial effusion   SOB (shortness of breath)   Elevated troponin   Acute on chronic diastolic heart failure (HCC)   Dyspnea   Pneumonia of right middle lobe due to infectious organism ([place])   . acetaZOLAMIDE  250 mg Intravenous Q6H  . amiodarone  200 mg Per Tube Daily  . bacitracin-polymyxin b   Left Eye QHS  . budesonide (PULMICORT) nebulizer solution  0.5 mg Nebulization BID  . chlorhexidine gluconate (MEDLINE KIT)  15 mL Mouth Rinse BID  . Chlorhexidine Gluconate Cloth  6 each Topical Daily  . clonazepam  1 mg Per Tube BID  . docusate  100 mg Per Tube Daily  . escitalopram  20 mg Per Tube Daily  . famotidine  20 mg Oral Daily  . feeding supplement (PRO-STAT SUGAR FREE 64)  60 mL Per Tube BID  . feeding supplement (VITAL HIGH PROTEIN)  1,000 mL Per Tube Q24H  . levothyroxine  150 mcg Per Tube QAC breakfast  . mouth rinse  15 mL Mouth Rinse 10 times per day  . metolazone  5 mg Oral Daily  . multivitamin with minerals  1 tablet Per Tube Daily  . nortriptyline  75 mg Per Tube QHS  . potassium chloride  40 mEq Per Tube TID  . QUEtiapine  50 mg Per Tube BID  . sodium chloride flush  10-40 mL Intracatheter Q12H    Recommendations:  ceftaroline  I will stop vancomycin and piptazo  Assessment: He has persistent findings on his CXR, some worsening.  Differential is infectious such as worsening MRSA penumonia or other vs cardiac with his significant cardiomegaly.  I will change to ceftaroline for better coverage and also broad coverage   Antibiotics: Day 19 total  antibiotics  HPI: Wayne Booth is a 77 y.o. male admitted on 8/8 with for SOB and treated initially for pneumonia with cefepime with negative sputum culture, worsened significantly on 8/17 and required intubation on 8/17 by CCM.  Culture then noted to be MRSA positive and started on vancomycin. He remains intubated now and discussing with family consideration of one way extubation.  He failed extubation on 8/22 and has been persistently unable to wean.  He has remained off of pressor support since 8/19.  Now with increasing leukocytosis again and worsening CXR findings.  He also has a significant cardiac history with Afib s/p ablation by Dr. Curt Bears, history of a PE.  History otherwise unavailable from the patient    Review of Systems: Unobtainable due to patient factors   Past Medical History:  Diagnosis Date  . Allergy    States his nose runs when he is around grease.   Marland Kitchen Anxiety   . Arthritis   . Atrial fibrillation ([place])   . Cancer ([place])   . Coronary artery disease   . History of total knee replacement   . Hypertension   . Hypothyroidism   . Kidney cancer, primary, with metastasis from kidney to other site [facility])   . Kidney stone   . OSA (obstructive sleep apnea)  pt does not wear cpap at night  . Pituitary cyst ([place])   . Pneumonia    hx of  . Prostate cancer [facility]    From chart review, not obtainable from the patient  Social History   Tobacco Use  . Smoking status: Former Smoker    Years: 4.00    Types: Cigars  . Smokeless tobacco: Never Used  . Tobacco comment: QUIT 2 YEARS AGO   2017  Substance Use Topics  . Alcohol use: Yes    Alcohol/week: 2.0 standard drinks    Types: 2 Shots of liquor per week    Comment: socially  . Drug use: No   From chart review, not obtainable from the patient  Family History  Problem Relation Age of Onset  . Cancer Father        [place], died before patient born  . Cancer Mother        stomach, died when he was young  .  Kidney cancer Sister   . Colon cancer Brother 46  . Esophageal cancer Neg Hx   . Rectal cancer Neg Hx   . Stomach cancer Neg Hx    From chart review, not obtainable from the patient  Allergies  Allergen Reactions  . Ambien [Zolpidem] Anxiety    Anxiety and agitation when tried as sleeper in hospital aug 2019   From chart review, not obtainable from the patient  Physical Exam: Constitutional: NAD   Vitals:   02/19/18 1100 02/19/18 1203  BP: 105/63 109/70  Pulse: (!) 104 (!) 102  Resp: 18 17  Temp: 98.2 F (36.8 C)   SpO2: 98% 100%   EYES: anicteric ENMT: +ET Cardiovascular: Cor RRR Respiratory: breathing assisted on vent; coarse breath sounds anteriorly GI: Bowel sounds are normal, liver is not enlarged, spleen is not enlarged Musculoskeletal: no pedal edema noted Skin: negatives: no rash Neuro: sedated, unresponsive   Lab Results  Component Value Date   WBC 25.3 (H) 02/19/2018   HGB 13.1 02/19/2018   HCT 42.4 02/19/2018   MCV 93.0 02/19/2018   PLT 478 (H) 02/19/2018    Lab Results  Component Value Date   CREATININE 1.65 (H) 02/19/2018   BUN 67 (H) 02/19/2018   NA 142 02/19/2018   K 3.7 02/19/2018   CL 106 02/19/2018   CO2 28 02/19/2018    Lab Results  Component Value Date   ALT 225 (H) 02/19/2018   AST 140 (H) 02/19/2018   ALKPHOS 90 02/19/2018     Microbiology: Recent Results (from the past 240 hour(s))  Culture, respiratory (non-expectorated)     Status: None   Collection Time: 02/12/18  1:06 PM  Result Value Ref Range Status   Specimen Description TRACHEAL ASPIRATE  Final   Special Requests Normal  Final   Gram Stain   Final    FEW WBC PRESENT, PREDOMINANTLY PMN RARE GRAM POSITIVE COCCI Performed at [facility], [address] [place], [place] [address]    Culture FEW METHICILLIN RESISTANT STAPHYLOCOCCUS AUREUS  Final   Report Status 02/16/2018 FINAL  Final   Organism ID, Bacteria METHICILLIN RESISTANT STAPHYLOCOCCUS AUREUS  Final        Susceptibility   Methicillin resistant staphylococcus aureus - MIC*    CIPROFLOXACIN >=8 RESISTANT Resistant     ERYTHROMYCIN >=8 RESISTANT Resistant     GENTAMICIN <=0.5 SENSITIVE Sensitive     OXACILLIN >=4 RESISTANT Resistant     TETRACYCLINE <=1 SENSITIVE Sensitive     VANCOMYCIN <=  0.5 SENSITIVE Sensitive     TRIMETH/SULFA <=10 SENSITIVE Sensitive     CLINDAMYCIN <=0.25 SENSITIVE Sensitive     RIFAMPIN <=0.5 SENSITIVE Sensitive     Inducible Clindamycin NEGATIVE Sensitive     * FEW METHICILLIN RESISTANT STAPHYLOCOCCUS AUREUS  Culture, blood (routine x 2)     Status: None   Collection Time: 02/12/18  1:35 PM  Result Value Ref Range Status   Specimen Description BLOOD RIGHT ARM  Final   Special Requests   Final    BOTTLES DRAWN AEROBIC AND ANAEROBIC Blood Culture adequate volume   Culture   Final    NO GROWTH 5 DAYS Performed at [facility], [address] [place], [place] [address]    Report Status 02/17/2018 FINAL  Final  Culture, blood (routine x 2)     Status: None   Collection Time: 02/12/18  1:42 PM  Result Value Ref Range Status   Specimen Description BLOOD RIGHT ANTECUBITAL  Final   Special Requests   Final    BOTTLES DRAWN AEROBIC AND ANAEROBIC Blood Culture adequate volume   Culture   Final    NO GROWTH 5 DAYS Performed at [facility], [place] [address], [place], [place] [address]    Report Status 02/17/2018 FINAL  Final  MRSA PCR Screening     Status: Abnormal   Collection Time: 02/12/18  2:29 PM  Result Value Ref Range Status   MRSA by PCR POSITIVE (A) NEGATIVE Final    Comment:        The GeneXpert MRSA Assay (FDA approved for NASAL specimens only), is one component of a comprehensive MRSA colonization surveillance program. It is not intended to diagnose MRSA infection nor to guide or monitor treatment for MRSA infections. RESULT CALLED TO, READ BACK BY AND VERIFIED WITH: DEE DEE @ 1825 ON 02/12/18 BY ROBINSON Z,.    Culture, Urine      Status: None   Collection Time: 02/13/18 11:37 AM  Result Value Ref Range Status   Specimen Description URINE, RANDOM  Final   Special Requests NONE  Final   Culture   Final    NO GROWTH Performed at [facility], [address], [place], [place] [address]    Report Status 02/14/2018 FINAL  Final  Culture, respiratory (non-expectorated)     Status: None   Collection Time: 02/15/18  4:01 AM  Result Value Ref Range Status   Specimen Description TRACHEAL ASPIRATE  Final   Special Requests NONE  Final   Gram Stain   Final    ABUNDANT WBC PRESENT,BOTH PMN AND MONONUCLEAR ABUNDANT GRAM POSITIVE COCCI FEW YEAST Performed at [facility], [place] [address], [place],  [address]    Culture   Final    ABUNDANT METHICILLIN RESISTANT STAPHYLOCOCCUS AUREUS   Report Status 02/17/2018 FINAL  Final   Organism ID, Bacteria METHICILLIN RESISTANT STAPHYLOCOCCUS AUREUS  Final      Susceptibility   Methicillin resistant staphylococcus aureus - MIC*    CIPROFLOXACIN >=8 RESISTANT Resistant     ERYTHROMYCIN >=8 RESISTANT Resistant     GENTAMICIN <=0.5 SENSITIVE Sensitive     OXACILLIN >=4 RESISTANT Resistant     TETRACYCLINE <=1 SENSITIVE Sensitive     VANCOMYCIN <=0.5 SENSITIVE Sensitive     TRIMETH/SULFA <=10 SENSITIVE Sensitive     CLINDAMYCIN <=0.25 SENSITIVE Sensitive     RIFAMPIN <=0.5 SENSITIVE Sensitive     Inducible Clindamycin NEGATIVE Sensitive     *  ABUNDANT METHICILLIN RESISTANT STAPHYLOCOCCUS AUREUS  Culture, blood (routine x 2)     Status: None (Preliminary result)   Collection Time: 02/18/18 10:38 AM  Result Value Ref Range Status   Specimen Description BLOOD LEFT ANTECUBITAL  Final   Special Requests   Final    BOTTLES DRAWN AEROBIC AND ANAEROBIC Blood Culture adequate volume   Culture   Final    NO GROWTH 1 DAY Performed at [facility], [address], [place], [place] [address]    Report Status PENDING  Incomplete  Culture, blood (routine x 2)      Status: None (Preliminary result)   Collection Time: 02/18/18 10:38 AM  Result Value Ref Range Status   Specimen Description BLOOD LEFT HAND  Final   Special Requests   Final    BOTTLES DRAWN AEROBIC ONLY Blood Culture results may not be optimal due to an inadequate volume of blood received in culture bottles   Culture   Final    NO GROWTH 1 DAY Performed at [facility], [place] [address], [place], [place] [address]    Report Status PENDING  Incomplete  Culture, respiratory (non-expectorated)     Status: None (Preliminary result)   Collection Time: 02/18/18 12:00 PM  Result Value Ref Range Status   Specimen Description TRACHEAL ASPIRATE  Final   Special Requests NONE  Final   Gram Stain   Final    RARE WBC PRESENT,BOTH PMN AND MONONUCLEAR NO ORGANISMS SEEN    Culture   Final    FEW YEAST IDENTIFICATION TO FOLLOW Performed at [facility], [place] [address], [place], [place] [address]    Report Status PENDING  Incomplete    Thayer Headings, [place] [place] [email or web address] [phone] pager  [phone] cell 02/19/2018, 12:46 PM

## 2018-02-19 NOTE — Progress Notes (Signed)
PULMONARY / CRITICAL CARE MEDICINE   Name: Wayne Booth MRN: [id number] DOB: [birth year]    ADMISSION DATE:  02/02/2018 CONSULTATION DATE: 8/17/201  CHIEF COMPLAINT: Respiratory extremis   Brief 77 year old with a history of atrial fibrillation and diastolic dysfunction who presented 02/02/2018 with right-sided pleuritic chest pain fever and dyspnea.  He was treated with an empiric combination of azithromycin and cefepime and at the same time was diuresed for diastolic failure.  Overnight 02/11/18  he has had increasing dyspnea with midline chest pain is not clear whether or not this is been pleuritic; no radiation, no productive cough.  Patchy infiltrates on the CT scan of the chest obtained on 8/18 1 of them up appears consistent with a peripheral nodule.  He has a history of both prostate cancer and renal cancer and reported both of these have undergone curative treatment.  SUBJECTIVE/OVERNIGHT/INTERVAL HX Tmax 101.7, WBC up 24 Ongoing unprovoked agitation overnight (wakes up, panics, desaturates, tachypneic), on fentanyl 375, precedex 1.7, w/additional 12mg  of versed pushes -1.2L after switching to daily lasix, sCr up 1.49 ->1.83 hematuria- heparin being held till urine clears Reported copious secretions overnight, Minimal this am  VITAL SIGNS: BP 105/63   Pulse (!) 104   Temp 98.2 F (36.8 C)   Resp 18   Ht 5\' 10"  (1.778 m)   Wt 126.9 kg   SpO2 98%   BMI 40.14 kg/m   HEMODYNAMICS: CVP:  [6 mmHg-63 mmHg] 6 mmHg  VENTILATOR SETTINGS: Vent Mode: PSV;CPAP FiO2 (%):  [40 %] 40 % Set Rate:  [16 bmp] 16 bmp Vt Set:  [600 mL] 600 mL PEEP:  [5 cmH20] 5 cmH20 Pressure Support:  [10 cmH20] 10 cmH20 Plateau Pressure:  [21 cmH20-23 cmH20] 22 cmH20  INTAKE / OUTPUT: I/O last 3 completed shifts: In: 6511.1 [I.V.:2584.7; Other:50; NW/GN:5621.3; IV Piggyback:1210.8] Out: 0865 [Urine:5500]  PHYSICAL EXAMINATION:  General:  Critically ill male in NAD, sedate on MV HEENT: MM  pink/dry, pupils 3/reactive, 7.5 ETT at 24.5 at lip, OGT Neuro: sedated, not f/c CV:  rrr, no m/r/g, +1 distal pulses PULM: even/non-labored on MV, breathing at set rate 18, lungs bilaterally coarse, bibasilar rales GI: obese, remains softer, +BS  Extremities: cool (core warm) /dry, trace BLE edema  Skin: no rashes  PULMONARY Recent Labs  Lab 02/13/18 0337  02/16/18 0351 02/17/18 0355 02/18/18 0406 02/18/18 0910 02/18/18 1707 02/19/18 0330  PHART 7.292*   < > 7.444 7.533* 7.506* 7.556* 7.451* 7.409  PCO2ART 47.8   < > 42.7 38.2 38.7 35.0 38.7 44.1  PO2ART 69.0*   < > 74.0* 53.5* 65.0* 73.0* 97.0 87.5  HCO3 23.1   < > 29.2* 32.0* 30.3* 30.8* 26.9 27.3  TCO2 25  --  30  --  31 32 28  --   O2SAT 91.0   < > 95.0 88.7 93.0 96.0 98.0 95.6   < > = values in this interval not displayed.   CBC Recent Labs  Lab 02/17/18 0330 02/18/18 0416 02/19/18 0503  HGB 11.7* 13.5 13.1  HCT 36.9* 42.2 42.4  WBC 20.7* 24.0* 25.3*  PLT 579* 575* 478*   COAGULATION Recent Labs  Lab 02/14/18 0555  INR 2.04   CARDIAC  No results for input(s): TROPONINI in the last 168 hours. No results for input(s): PROBNP in the last 168 hours.  CHEMISTRY Recent Labs  Lab 02/14/18 1603 02/15/18 0419 02/16/18 0521 02/17/18 0330 02/18/18 0416 02/19/18 0503  NA  --  135 137 139 141 142  K  --  4.8 4.3 3.1* 3.9 3.7  CL  --  101 100 103 101 106  CO2  --  27 30 28  32 28  GLUCOSE  --  150* 164* 107* 120* 138*  BUN  --  68* 85* 69* 78* 67*  CREATININE  --  2.32* 2.06* 1.49* 1.83* 1.65*  CALCIUM  --  8.1* 8.1* 6.7* 8.5* 8.1*  MG 2.9* 2.8* 2.7* 1.7 2.4 2.3  PHOS 4.2 4.6 4.6 2.4* 2.9  --    Estimated Creatinine Clearance: 51.8 mL/min (A) (by C-G formula based on SCr of 1.65 mg/dL (H)).  LIVER Recent Labs  Lab 02/14/18 0555 02/17/18 0330 02/18/18 0416 02/19/18 0503  AST 147* 50*  --  140*  ALT 294* 142*  --  225*  ALKPHOS 84 56  --  90  BILITOT 1.1 0.8  --  1.6*  PROT 6.6 5.1*  --  6.2*   ALBUMIN 2.1* 1.8* 2.3* 2.3*  INR 2.04  --   --   --    INFECTIOUS Recent Labs  Lab 02/12/18 1332 02/12/18 1647  02/14/18 0001  02/17/18 0330 02/18/18 0416 02/19/18 0503  LATICACIDVEN 2.9* 2.8*  --  1.2  --   --   --   --   PROCALCITON 0.43  --    < >  --    < > 0.17 0.18 0.18   < > = values in this interval not displayed.   ENDOCRINE CBG (last 3)  Recent Labs    02/18/18 2330 02/19/18 0342 02/19/18 0813  GLUCAP 122* 131* 127*   IMAGING x48h  - image(s) personally visualized  -   highlighted in bold Dg Chest Port 1 View  Result Date: 02/19/2018 CLINICAL DATA:  Respiratory failure. EXAM: PORTABLE CHEST 1 VIEW COMPARISON:  Radiograph of February 18, 2018. FINDINGS: Stable cardiomegaly. Endotracheal and nasogastric tubes are unchanged in position. Stable position of right-sided PICC line is noted. No pneumothorax is noted. Stable bilateral pulmonary edema is noted with associated pleural effusions. Bony thorax is unremarkable. IMPRESSION: Stable support apparatus. Stable bilateral pulmonary edema with associated pleural effusions. Electronically Signed   By: Marijo Conception, M.D.   On: 02/19/2018 08:34   Dg Chest Port 1 View  Result Date: 02/18/2018 CLINICAL DATA:  Respiratory failure. EXAM: PORTABLE CHEST 1 VIEW COMPARISON:  02/17/2018. FINDINGS: Right PICC line noted with tip over cavoatrial junction. Endotracheal tube and NG tube in stable position. Cardiomegaly with diffuse bilateral pulmonary infiltrates/edema bilateral pleural effusions. Findings consistent CHF. No pneumothorax. IMPRESSION: 1. Right PICC line noted with tip at cavoatrial junction. Endotracheal tube and NG tube in stable position. 2. Cardiomegaly with diffuse bilateral pulmonary infiltrates/edema and bilateral pleural effusions. These findings have progressed from prior exam. Electronically Signed   By: Marcello Moores  Register   On: 02/18/2018 06:22   Dg Chest Port 1 View  Result Date: 02/17/2018 CLINICAL DATA:   Encounter for endotracheal tube placement EXAM: PORTABLE CHEST 1 VIEW COMPARISON:  Earlier today FINDINGS: Retracted endotracheal tube with tip now between the clavicular heads and carina. An orogastric tube reaches the stomach. Right upper extremity PICC with tip at the SVC. Haziness of the lower chest from atelectasis and layering effusions most likely. There is no Kerley lines or pneumothorax. Chronic cardiomegaly and vascular pedicle widening IMPRESSION: 1. Repositioned endotracheal tube now in good position. 2. Otherwise stable. Electronically Signed   By: Monte Fantasia M.D.   On: 02/17/2018 14:43   Dg Chest [facility]  Result Date: 02/17/2018 CLINICAL DATA:  Intubation, orogastric tube placement EXAM: PORTABLE CHEST 1 VIEW COMPARISON:  Portable exam 1422 hours compared to 0453 hours FINDINGS: Tip of endotracheal tube projects over proximal RIGHT mainstem bronchus recommend withdrawal 2.5 cm. Tip of orogastric tube extends into stomach below extent of chest x-ray. Enlargement of cardiac silhouette with vascular congestion. Atherosclerotic calcification aorta. Persistent pulmonary infiltrates in the mid to lower lungs. No pneumothorax. IMPRESSION: Tip of endotracheal tube projects over proximal RIGHT mainstem bronchus; recommend withdrawal 2.5 cm. Persistent infiltrates bilaterally. Findings called to [facility] in [facility]M MICU on 02/17/2018 at 1442 hours. Electronically Signed   By: Lavonia Dana M.D.   On: 02/17/2018 14:43   LINES/TUBES: ETT 8/17>>> R PICC 8/17>>>  CULTURES: 8/7 BC x 2 >> neg 8/8 MRSA PCR >> negatve 8/8 sputum cx >> neg 8/17 MRSA PCR >> positive  8/17 trach asp cx >> few MRSA 8/17 BCx2 >> 8/18 UC >> neg 8/20 trach asp >> MRSA 8/23 trach asp>> 8/23 BCx2 >> 8/23 UA >>  ANTIBIOTICS: 8/7 vanc >> 8/8; 8/17 >> 8/7 cefepime >> 8/15 8/11 azithromycin >> 8/17 >> 8/21 8/17 zosyn >> 8/20; 8/23 >> Rocephin 8/20>>>8/21  EVENTS 02/02/2018 - admit 8/8 - CT CT with new findings of  Bilateral LL consolidation and RML and RUL infilrrates compated to  April 2018 8/17 - tx ICU and intubation; Vancomycin and Zosyn started 8/17. Had new onset fever since admit  with rising WBC 20K.  cxr - stable iniflrtares PICC placed. 8/18 - Needing 60% fio2./peep 5 - fio2 worsening overnight. On fent gtt but oriented. Has trazazone  prn , xanax prn and lexapro scheduled , pamelor schedule, and sovorexant for insomnia .   On levophed 45mcg. Also on eliquis,  amio tab, cardiszem tab and lopressor table. Noted  allopurinol and colchicine; s/p 2 doses solumedrol yesterday.  Sons and daughter in law  rerport - significant diuresis in medical floor with fluid restriction.  Low NA worse to 129. AKI  worse 1.6 -> 2.6 . Also report dusky toes 8/21 Failed SBT this am x 2- no effort per pt.  RN reports patient goes from being completely  sedated to then wild, biting, and trying to pull at lines. Large BM yesterday after enema.  Was  on precedex 1.2 and fentanyl 350 this am, now tolerating precedex at 1 and fentanyl 150  Mcg/hr Neg 4L/ net -10L and at admit weight; Lasix gtt was held overnight and restarted at  7am 8/22  Ongoing agitation requiring fentanyl gtt 300 mcg/hr and precedex 1.2 with prn versed pushes  every 2-3 hours  (bitting tube, failing arms, reaching for tubes).  2 bowel movements  overnight Diuresed -5.4 L on lasix gtt at 5.  CXR this am notes ETT in right mainstem.   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  He also has a history of diastolic heart failure.  CTA showed patchy infiltrates on the right and no PE.  He was empirically treated with a combination of  azithromycin and cefepime but developed acute dyspnea 8/17 requiring intubation.  He has been diuresed.  Sputum cx showing few MRSA PNA being treated with vanc.  Ongoing  agitation has been a barrier to weaning.  Failed attempt at extubation 8/22.    ASSESSMENT / PLAN:  PULMONARY A:  Acute hypoxic Resp Failure  HCAP- MRSA PNA Hx OSA - Day 8/x of ETT - New diagnosis of PNA, on abx - Alkalotic on ABG  Plan: Hold SBT today Continue full MV support Repeat ABG while pt at rr 18, verifies ongoing alkalosis, therefore vent rate dropped to 16 Will recheck ABG 4 hours after vent changes and diamox dose FiO2/ peep goal for sats 88-92%,  Pepcid for SUP, changed to PO to decrease volume See ID section below Pulmicort BID, duonebs prn q4   CARDIOVASCULAR A:  Diastolic HF Shock - possibly septic +/- cardiogenic , resolved; off pressors since 8/19 Hx Afib on prior eliquis, CAD, HTN - TTE 8/9 LVEF 50-55%; technically difficult study; no clear wall motion abnormalities Plan: Tele monitoring Goal MAP of 65 mmHg Continue amiodarone 200 mg daily Holding cardizem and lopressor ( SR maintaining 58-70's while on precedex)  Resume heparin drip  RENAL A:  AKI on CKD; hx of solitary kidney Mixed alkalosis  - sCr worse today, -1.2L/ 24, good UOP Plan:  Diamox 250 mg IV q6 x3 doses Lasix 8 mg/hr x24 hours Zaroxolyn 5 mg PO x1 Trend BMP/ mag/ phos / urinary output/ daily wts Replace electrolytes as indicated  GASTROINTESTINAL A:  NPO Constipation resolved, s/p BM 8/20, 8/21 Mild Transaminitis  Plan:  TF at goal 60 ml/hr Colace BID  Dulcolax PRN Trend LFTs  HEMATOLOGIC  A: Chronically on Eliquis  (Estimated Creatinine Clearance: 51.8 mL/min (A) (by C-G formula based on SCr of 1.65 mg/dL (H)). )  Recent Labs  Lab 02/15/18 0419 02/16/18 0521 02/17/18 0330 02/18/18 0416 02/19/18 0503  CREATININE 2.32* 2.06* 1.49* 1.83* 1.65*  P Resume Heparin drip per pharmacy Monitor for further  hematuria, no other bleeding noted Will transition back to Eliquis once extubated and taking PO  INFECTIOUS A:  MRSA PNA ?new infectious source given dramatic change in CXR with fever, worsening leukocytosis  Plan Continue vanc for goal of 8 days Pan culture Check UA Added empiric zosyn Cooling blanket for fever  Trend WBC/ fever curve   ENDOCRINE A:  Hx of transsphenoidal resection and thyroid replacement  Plan:  Continue synthroid daily Stop solumedrol Trend CBGs, add SSI if glucose > 180 x 2  NEUROLOGIC A:   Delirium -ongoing - home trazazone prn, xanax prn and lexapro scheduled , pamelor schedule, and sovorexant for insomnia  - do not think precedex is helping at this point, given additional prn versed requirements  Plan D/c precedex and versed prn  Add propofol and monitor triglyceride, if this doesn't work, ? Trying versed gtt Continue fentanyl gtt (quad strength to limit fluids) Continue klonopin 1mg  BID Add seroquel 50 mg BID, daily monitoring of QTc (today is 0.41) Bowel regimen with PAD protocol  Continue pamelol and lexapro Holding xanax, trazodone, sovorrexant  MSK A: Gout Plan:  Hold allopurinil Holding colchicine empirically, as sCr is elevated today  FAMILY Family updated bedside, asked to determine whether or not patient would want a trach/peg to be told by Monday prior to planned extubation.  The patient is critically ill with multiple organ systems failure and requires high complexity decision making for assessment and support, frequent evaluation and titration of therapies, application of advanced monitoring technologies and extensive interpretation of multiple databases.   Critical Care Time devoted to patient care services described in this note is  45  Minutes. This time reflects time of care of this signee Dr [name]. This critical care time does not reflect procedure time, or teaching time or supervisory time of PA/NP/Med student/Med  Resident etc but could involve care discussion time.  Rush Farmer, M.D. [facility]/Critical [facility] Medicine. Pager: [phone]. After hours pager: [phone].

## 2018-02-20 ENCOUNTER — Inpatient Hospital Stay (HOSPITAL_COMMUNITY): Payer: Medicare Other

## 2018-02-20 LAB — CBC
HCT: 47.6 % (ref 39.0–52.0)
HEMOGLOBIN: 14.5 g/dL (ref 13.0–17.0)
MCH: 28.7 pg (ref 26.0–34.0)
MCHC: 30.5 g/dL (ref 30.0–36.0)
MCV: 94.1 fL (ref 78.0–100.0)
PLATELETS: 465 10*3/uL — AB (ref 150–400)
RBC: 5.06 MIL/uL (ref 4.22–5.81)
RDW: 15.8 % — ABNORMAL HIGH (ref 11.5–15.5)
WBC: 22.3 10*3/uL — AB (ref 4.0–10.5)

## 2018-02-20 LAB — GAMMA GT: GGT: 510 U/L — ABNORMAL HIGH (ref 7–50)

## 2018-02-20 LAB — CULTURE, RESPIRATORY

## 2018-02-20 LAB — GLUCOSE, CAPILLARY
GLUCOSE-CAPILLARY: 126 mg/dL — AB (ref 70–99)
GLUCOSE-CAPILLARY: 127 mg/dL — AB (ref 70–99)
GLUCOSE-CAPILLARY: 128 mg/dL — AB (ref 70–99)
Glucose-Capillary: 140 mg/dL — ABNORMAL HIGH (ref 70–99)
Glucose-Capillary: 127 mg/dL — ABNORMAL HIGH (ref 70–99)
Glucose-Capillary: 123 mg/dL — ABNORMAL HIGH (ref 70–99)
Glucose-Capillary: 129 mg/dL — ABNORMAL HIGH (ref 70–99)

## 2018-02-20 LAB — MAGNESIUM: Magnesium: 2.2 mg/dL (ref 1.7–2.4)

## 2018-02-20 LAB — COMPREHENSIVE METABOLIC PANEL
ALT: 312 U/L — AB (ref 0–44)
ANION GAP: 10 (ref 5–15)
AST: 161 U/L — ABNORMAL HIGH (ref 15–41)
Albumin: 2.6 g/dL — ABNORMAL LOW (ref 3.5–5.0)
Alkaline Phosphatase: 120 U/L (ref 38–126)
BUN: 72 mg/dL — ABNORMAL HIGH (ref 8–23)
CALCIUM: 8.9 mg/dL (ref 8.9–10.3)
CHLORIDE: 102 mmol/L (ref 98–111)
CO2: 30 mmol/L (ref 22–32)
CREATININE: 2.14 mg/dL — AB (ref 0.61–1.24)
GFR calc Af Amer: 33 mL/min — ABNORMAL LOW (ref >60)
GFR, EST NON AFRICAN AMERICAN: 29 mL/min — AB (ref >60)
Glucose, Bld: 181 mg/dL — ABNORMAL HIGH (ref 70–99)
Potassium: 3.6 mmol/L (ref 3.5–5.1)
SODIUM: 142 mmol/L (ref 135–145)
Total Bilirubin: 1.6 mg/dL — ABNORMAL HIGH (ref 0.3–1.2)
Total Protein: 7.1 g/dL (ref 6.5–8.1)

## 2018-02-20 LAB — CULTURE, RESPIRATORY W GRAM STAIN

## 2018-02-20 LAB — BLOOD GAS, ARTERIAL
ACID-BASE EXCESS: 5 mmol/L — AB (ref 0.0–2.0)
Bicarbonate: 29.4 mmol/L — ABNORMAL HIGH (ref 20.0–28.0)
Drawn by: 51702
FIO2: 0.4
LHR: 16 {breaths}/min
O2 SAT: 95.9 %
PATIENT TEMPERATURE: 99.9
PCO2 ART: 48.1 mmHg — AB (ref 32.0–48.0)
PEEP: 5 cmH2O
PH ART: 7.407 (ref 7.350–7.450)
PO2 ART: 85.3 mmHg (ref 83.0–108.0)
VT: 600 mL

## 2018-02-20 LAB — PHOSPHORUS: Phosphorus: 4.8 mg/dL — ABNORMAL HIGH (ref 2.5–4.6)

## 2018-02-20 LAB — APTT: aPTT: 88 seconds — ABNORMAL HIGH (ref 24–36)

## 2018-02-20 LAB — HEPARIN LEVEL (UNFRACTIONATED): HEPARIN UNFRACTIONATED: 1.32 [IU]/mL — AB (ref 0.30–0.70)

## 2018-02-20 LAB — LACTATE DEHYDROGENASE: LDH: 311 U/L — ABNORMAL HIGH (ref 98–192)

## 2018-02-20 MED ORDER — SODIUM CHLORIDE 0.9 % IV SOLN
400.0000 mg | Freq: Two times a day (BID) | INTRAVENOUS | Status: DC
  Administered 2018-02-20 – 2018-02-23 (×7): 400 mg via INTRAVENOUS
  Filled 2018-02-20 (×8): qty 400

## 2018-02-20 MED ORDER — POTASSIUM CHLORIDE 20 MEQ/15ML (10%) PO SOLN
40.0000 meq | Freq: Once | ORAL | Status: AC
  Administered 2018-02-20: 40 meq
  Filled 2018-02-20: qty 30

## 2018-02-20 NOTE — Progress Notes (Signed)
PULMONARY / CRITICAL CARE MEDICINE   Name: Wayne Booth MRN: [id number] DOB: 07-10-1940    ADMISSION DATE:  02/02/2018 CONSULTATION DATE: 8/17/201  CHIEF COMPLAINT: Respiratory extremis   Brief 77 year old with a history of atrial fibrillation and diastolic dysfunction who presented 02/02/2018 with right-sided pleuritic chest pain fever and dyspnea.  He was treated with an empiric combination of azithromycin and cefepime and at the same time was diuresed for diastolic failure.  Overnight 02/11/18  he has had increasing dyspnea with midline chest pain is not clear whether or not this is been pleuritic; no radiation, no productive cough.  Patchy infiltrates on the CT scan of the chest obtained on 8/18 1 of them up appears consistent with a peripheral nodule.  He has a history of both prostate cancer and renal cancer and reported both of these have undergone curative treatment.  SUBJECTIVE/OVERNIGHT/INTERVAL HX Tmax 100.6, WBC trending back down with ceftaroline. Pt back on [facility] from PS with increased sedation.   VITAL SIGNS: BP 111/77   Pulse 97   Temp 98.8 F (37.1 C)   Resp 17   Ht 5\' 10"  (1.778 m)   Wt 123.6 kg   SpO2 100%   BMI 39.10 kg/m   HEMODYNAMICS: CVP:  [5 mmHg-6 mmHg] 6 mmHg  VENTILATOR SETTINGS: Vent Mode: PRVC FiO2 (%):  [40 %] 40 % Set Rate:  [16 bmp] 16 bmp Vt Set:  [600 mL] 600 mL PEEP:  [5 cmH20] 5 cmH20 Pressure Support:  [10 cmH20] 10 cmH20 Plateau Pressure:  [22 cmH20-25 cmH20] 23 cmH20  INTAKE / OUTPUT: I/O last 3 completed shifts: In: 5208.5 [I.V.:1441.2; Other:40; NG/GT:2510; IV Piggyback:1217.3] Out: 4235 [Urine:8220; Emesis/NG output:30]  PHYSICAL EXAMINATION:  Constitution: sedated, obese, critically ill appearing HEENT: [place]/AT, ET/OG in place   Cardio: regular rate & rhythm, no m/r/g Respiratory: course, decreased breath sounds bilaterally Abdominal: hypoactive, soft, distended MSK: +1 pitting edema, +pedal/radial pulses Neuro: sedated,  does not respond to stimuli Skin: clean, dry, intact, no rashes    PULMONARY Recent Labs  Lab 02/16/18 0351  02/18/18 0406 02/18/18 0910 02/18/18 1707 02/19/18 0330 02/20/18 0335  PHART 7.444   < > 7.506* 7.556* 7.451* 7.409 7.407  PCO2ART 42.7   < > 38.7 35.0 38.7 44.1 48.1*  PO2ART 74.0*   < > 65.0* 73.0* 97.0 87.5 85.3  HCO3 29.2*   < > 30.3* 30.8* 26.9 27.3 29.4*  TCO2 30  --  31 32 28  --   --   O2SAT 95.0   < > 93.0 96.0 98.0 95.6 95.9   < > = values in this interval not displayed.   CBC Recent Labs  Lab 02/18/18 0416 02/19/18 0503 02/20/18 0508  HGB 13.5 13.1 14.5  HCT 42.2 42.4 47.6  WBC 24.0* 25.3* 22.3*  PLT 575* 478* 465*   COAGULATION Recent Labs  Lab 02/14/18 0555  INR 2.04   CARDIAC  No results for input(s): TROPONINI in the last 168 hours. No results for input(s): PROBNP in the last 168 hours.  CHEMISTRY Recent Labs  Lab 02/15/18 0419 02/16/18 0521 02/17/18 0330 02/18/18 0416 02/19/18 0503 02/20/18 0508  NA 135 137 139 141 142 142  K 4.8 4.3 3.1* 3.9 3.7 3.6  CL 101 100 103 101 106 102  CO2 27 30 28  32 28 30  GLUCOSE 150* 164* 107* 120* 138* 181*  BUN 68* 85* 69* 78* 67* 72*  CREATININE 2.32* 2.06* 1.49* 1.83* 1.65* 2.14*  CALCIUM 8.1* 8.1* 6.7* 8.5* 8.1*  8.9  MG 2.8* 2.7* 1.7 2.4 2.3 2.2  PHOS 4.6 4.6 2.4* 2.9  --  4.8*   Estimated Creatinine Clearance: 39.3 mL/min (A) (by C-G formula based on SCr of 2.14 mg/dL (H)).  LIVER Recent Labs  Lab 02/14/18 0555 02/17/18 0330 02/18/18 0416 02/19/18 0503 02/19/18 1232 02/20/18 0508  AST 147* 50*  --  140* 157* 161*  ALT 294* 142*  --  225* 249* 312*  ALKPHOS 84 56  --  90 94 120  BILITOT 1.1 0.8  --  1.6* 1.6* 1.6*  PROT 6.6 5.1*  --  6.2* 6.5 7.1  ALBUMIN 2.1* 1.8* 2.3* 2.3* 2.2* 2.6*  INR 2.04  --   --   --   --   --    INFECTIOUS Recent Labs  Lab 02/14/18 0001  02/17/18 0330 02/18/18 0416 02/19/18 0503  LATICACIDVEN 1.2  --   --   --   --   PROCALCITON  --    < > 0.17  0.18 0.18   < > = values in this interval not displayed.   ENDOCRINE CBG (last 3)  Recent Labs    02/19/18 2004 02/20/18 0009 02/20/18 0409  GLUCAP 101* 126* 127*   IMAGING x48h  - image(s) personally visualized  -   highlighted in bold Dg Chest Port 1 View  Result Date: 02/19/2018 CLINICAL DATA:  Followup ventilator support EXAM: PORTABLE CHEST 1 VIEW COMPARISON:  02/19/2018 FINDINGS: Endotracheal tip is 2 cm above the carina. Nasogastric tube enters the abdomen. Right arm PICC tip is in the proximal right atrium. There is mild pulmonary edema. There are bilateral effusions with lower lobe atelectasis. IMPRESSION: No radiographic change. Lines and tubes well positioned. Persistent edema, effusions and lower lung volume loss. Electronically Signed   By: Nelson Chimes M.D.   On: 02/19/2018 13:03   Dg Chest Port 1 View  Result Date: 02/19/2018 CLINICAL DATA:  Respiratory failure. EXAM: PORTABLE CHEST 1 VIEW COMPARISON:  Radiograph of February 18, 2018. FINDINGS: Stable cardiomegaly. Endotracheal and nasogastric tubes are unchanged in position. Stable position of right-sided PICC line is noted. No pneumothorax is noted. Stable bilateral pulmonary edema is noted with associated pleural effusions. Bony thorax is unremarkable. IMPRESSION: Stable support apparatus. Stable bilateral pulmonary edema with associated pleural effusions. Electronically Signed   By: Marijo Conception, M.D.   On: 02/19/2018 08:34     . sodium chloride 10 mL/hr at 02/20/18 0700  . ceFTAROline (TEFLARO) IV Stopped (02/19/18 2245)  . fentaNYL infusion INTRAVENOUS 50 mcg/hr (02/20/18 0700)  . heparin 2,200 Units/hr (02/20/18 0700)  . midazolam (VERSED) infusion Stopped (02/20/18 0641)    LINES/TUBES: ETT 8/17>>> R PICC 8/17>>>  CULTURES: 8/7 BC x 2 >> neg 8/8 MRSA PCR >> negatve 8/8 sputum cx >> neg 8/17 MRSA PCR >> positive  8/17 trach asp cx >> few MRSA 8/17 BCx2 >> 8/18 UC >> neg 8/20 trach asp >> MRSA 8/23  trach asp>> 8/23 BCx2 >> 8/23 UA >>  ANTIBIOTICS: 8/7 vanc >> 8/8; 8/17 >> 8/7 cefepime >> 8/15 8/11 azithromycin >> 8/17 >> 8/21 8/17 zosyn >> 8/20; 8/23 >> Rocephin 8/20>>>8/21 8/24 Ceftaroline >>  EVENTS 02/02/2018 - admit 8/8 - CT CT with new findings of Bilateral LL consolidation and RML and RUL infilrrates compated to  April 2018 8/17 - tx ICU and intubation; Vancomycin and Zosyn started 8/17. Had new onset fever since admit  with rising WBC 20K.  cxr - stable iniflrtares PICC placed. 8/18 -  Needing 60% fio2./peep 5 - fio2 worsening overnight. On fent gtt but oriented. Has trazazone  prn , xanax prn and lexapro scheduled , pamelor schedule, and sovorexant for insomnia .   On levophed 49mcg. Also on eliquis,  amio tab, cardiszem tab and lopressor table. Noted  allopurinol and colchicine; s/p 2 doses solumedrol yesterday.  Sons and daughter in law  rerport - significant diuresis in medical floor with fluid restriction.  Low NA worse to 129. AKI  worse 1.6 -> 2.6 . Also report dusky toes 8/21 Failed SBT this am x 2- no effort per pt.  RN reports patient goes from being completely  sedated to then wild, biting, and trying to pull at lines. Large BM yesterday after enema.  Was  on precedex 1.2 and fentanyl 350 this am, now tolerating precedex at 1 and fentanyl 150  Mcg/hr Neg 4L/ net -10L and at admit weight; Lasix gtt was held overnight and restarted at  7am 8/22  Ongoing agitation requiring fentanyl gtt 300 mcg/hr and precedex 1.2 with prn versed pushes  every 2-3 hours  (bitting tube, failing arms, reaching for tubes).  2 bowel movements  overnight Diuresed -5.4 L on lasix gtt at 5.  CXR this am notes ETT in right mainstem.   Extubated, failed bipap and required reintubation after ~2 hrs  DISCUSSION:      This is an obese gentleman with a history of sleep apnea, atrial fibrillation s/p ablation, history of transsphenoidal resection, prostate cancer with seed placement, cancer of the kidney  status post left nephrectomy and nephrolithiasis who presented on 8/8 with right sided pleuritic chest pain.  He also has a history of diastolic heart failure.  CTA showed patchy infiltrates on the right and no PE.  He was empirically treated with a combination of azithromycin and cefepime but developed acute dyspnea 8/17 requiring intubation.  He has been diuresed.  Sputum cx showing few MRSA PNA being treated with vanc.  Ongoing agitation has been a barrier to weaning.  Failed attempt at extubation 8/22.    ASSESSMENT / PLAN:  Acute hypoxic Resp Failure secondary to HCAP- MRSA PNA with Hx OSA Intubated 8/17, Day 9/x of ETT. New diagnosis of PNA, on abx, ID consulted Switched back to [facility] - RR 16 Vt 600 FiO2 60 PEEP 5 Plan: Plan for SBT today Continue full MV support FiO2/ peep goal for sats 88-92%,  Pepcid for SUP, changed to PO to decrease volume See ID section below Pulmicort BID, duonebs prn q4   Diastolic HF Septic +/- cardiogenic off pressors since 8/19 Hx Afib on prior eliquis, CAD, HTN - TTE 8/9 LVEF 50-55%; technically difficult study; no clear wall motion abnormalities  Goal MAP of 65 mmHg Continue amiodarone 200 mg daily Holding cardizem and lopressor ( SR maintaining 58-70's while on precedex)  Resume heparin drip  AKI on CKD; hx of solitary kidney: Cr 2.14 << 1.65  Diuretics held for worsening AKI  Trend BMP/ mag/ phos / urinary output/ daily wts Replace electrolytes as indicated   Constipation resolved, s/p BM 8/23 Increasing Transaminitis Plan:  TF at goal 60 ml/hr Hepatitis panel, GGT pending, LDH Hepatic US?  Colace BID  Dulcolax PRN Trend LFTs   Chronically on Eliquis  (Estimated Creatinine Clearance: 39.3 mL/min (A) (by C-G formula based on SCr of 2.14 mg/dL (H)). )  Recent Labs  Lab 02/16/18 0521 02/17/18 0330 02/18/18 0416 02/19/18 0503 02/20/18 0508  CREATININE 2.06* 1.49* 1.83* 1.65* 2.14*   Resume Heparin  drip per pharmacy Monitor for  further hematuria, no other bleeding noted Will transition back to Eliquis once extubated and taking PO  MRSA PNA New infectious source given dramatic change in CXR with fever, WBC count improved Plan Switched to ceftaroline per ID Pan culture Cooling blanket for fever  Trend WBC/ fever curve   Hx of transsphenoidal resection and thyroid replacement  Plan:  Continue synthroid daily Stop solumedrol Trend CBGs, add SSI if glucose > 180 x 2  Delirium -ongoing: currently sedated fentanyl 16mcg home trazazone prn, xanax prn and lexapro scheduled , pamelor schedule, and sovorexant for insomnia   Plan D/c precedex and versed prn  Continue fentanyl gtt (quad strength to limit fluids) Continue klonopin 1mg  BID cont seroquel 50 mg BID, daily monitoring of QTc (8/25 is 428) Bowel regimen with PAD protocol  Continue pamelor 75mg  qd and lexapro 20mg  qd Holding xanax, trazodone, sovorrexant  Gout  Hold allopurinil Holding colchicine empirically, as sCr is elevated today  Diet: NPO/TF VTE: heparin, VTE  IVF: none  FAMILY Family updated bedside, asked to determine whether or not patient would want a trach/peg to be told by Monday prior to planned extubation.  Marty Heck, DO 02/20/2018, 7:25 AM Pager: [phone]

## 2018-02-20 NOTE — Progress Notes (Signed)
PHARMACY NOTE:  ANTIMICROBIAL RENAL DOSAGE ADJUSTMENT  Current antimicrobial regimen includes a mismatch between antimicrobial dosage and estimated renal function.  As per policy approved by the Pharmacy & Therapeutics and Medical Executive Committees, the antimicrobial dosage will be adjusted accordingly.  Current antimicrobial dosage:  Ceftaroline 600mg  IV q12h  Indication: Pneumonia  Renal Function:  Estimated Creatinine Clearance: 39.3 mL/min (A) (by C-G formula based on SCr of 2.14 mg/dL (H)). []      On intermittent HD, scheduled: []      On CRRT    Antimicrobial dosage has been changed to: Ceftaroline 400mg  IV q12h  Thank you for allowing pharmacy to be a part of this patient's care.  Mila Merry Gerarda Fraction, PharmD PGY2 Infectious Diseases Pharmacy Resident Phone: [phone] 02/20/2018 9:51 AM

## 2018-02-20 NOTE — Progress Notes (Signed)
ANTICOAGULATION CONSULT NOTE  Pharmacy Consult for Heparin  Indication: atrial fibrillation  Patient Measurements: Height: 5\' 10"  (177.8 cm) Weight: 272 lb 7.8 oz (123.6 kg) IBW/kg (Calculated) : 73 Heparin Dosing Weight: 106 kg  Vital Signs: Temp: 98.4 F (36.9 C) (08/25 0800) Temp Source: Rectal (08/25 0723) BP: 116/69 (08/25 0804) Pulse Rate: 95 (08/25 0804)  Labs: Recent Labs    02/18/18 0416  02/19/18 0503 02/19/18 1623 02/20/18 0508  HGB 13.5  --  13.1  --  14.5  HCT 42.2  --  42.4  --  47.6  PLT 575*  --  478*  --  465*  APTT  --    < > 139* 96* 88*  HEPARINUNFRC  --   --  1.80* 1.44* 1.32*  CREATININE 1.83*  --  1.65*  --  2.14*   < > = values in this interval not displayed.   Assessment: 30 yoM admitted for PNA, on apixaban PTA for h/o afib and PE. Apixaban has been held (last dose 8/18) and switched to IV heparin. Dosing heparin by aPTT given apixaban effect of heparin level. Despite >1 week since last dose, heparin level and aPTT still not correlating. aPTT remains within goal range at 88 this AM. No bleeding noted. Hgb stable WNL, pltc 465.  Goal of Therapy:  aPTT goal 66-102s Heparin level 0.3-0.7 units/ml Monitor platelets by anticoagulation protocol: Yes   Plan:  Continue IV heparin gtt @ 2200 units/hr Daily heparin level/aPTT and CBC Monitor s/sx of bleeding  Erin N. Gerarda Fraction, PharmD PGY2 Infectious Diseases Pharmacy Resident Phone: [phone] 02/20/18  9:45 AM

## 2018-02-20 NOTE — Progress Notes (Signed)
eLink Physician-Brief Progress Note Patient Name: Wayne Booth DOB: December 07, 1940 MRN: [id number]   Date of Service  02/20/2018  HPI/Events of Note  Agitation - Request to renew restraint orders.  eICU Interventions  Will renew restraint orders.      Intervention Category Minor Interventions: Agitation / anxiety - evaluation and management  Lysle Dingwall 02/20/2018, 10:37 PM

## 2018-02-20 NOTE — Progress Notes (Signed)
PULMONARY / CRITICAL CARE MEDICINE   Name: Wayne Booth MRN: [id number] DOB: [birth year]    ADMISSION DATE:  02/02/2018 CONSULTATION DATE: 8/17/201  CHIEF COMPLAINT: Respiratory extremis   Brief 77 year old with a history of atrial fibrillation and diastolic dysfunction who presented 02/02/2018 with right-sided pleuritic chest pain fever and dyspnea.  He was treated with an empiric combination of azithromycin and cefepime and at the same time was diuresed for diastolic failure.  Overnight 02/11/18  he has had increasing dyspnea with midline chest pain is not clear whether or not this is been pleuritic; no radiation, no productive cough.  Patchy infiltrates on the CT scan of the chest obtained on 8/18 1 of them up appears consistent with a peripheral nodule.  He has a history of both prostate cancer and renal cancer and reported both of these have undergone curative treatment.  SUBJECTIVE/OVERNIGHT/INTERVAL HX Fever overnight Weaning well this AM  VITAL SIGNS: BP 115/76   Pulse 96   Temp 99 F (37.2 C)   Resp (!) 24   Ht 5\' 10"  (1.778 m)   Wt 123.6 kg   SpO2 100%   BMI 39.10 kg/m   HEMODYNAMICS: CVP:  [3 mmHg-6 mmHg] 3 mmHg  VENTILATOR SETTINGS: Vent Mode: PSV;CPAP FiO2 (%):  [40 %] 40 % Set Rate:  [16 bmp] 16 bmp Vt Set:  [600 mL] 600 mL PEEP:  [5 cmH20] 5 cmH20 Pressure Support:  [10 cmH20] 10 cmH20 Plateau Pressure:  [22 cmH20-25 cmH20] 23 cmH20  INTAKE / OUTPUT: I/O last 3 completed shifts: In: 5251.6 [I.V.:1484.3; Other:40; NG/GT:2510; IV Piggyback:1217.3] Out: 6948 [Urine:8220; Emesis/NG output:30]  PHYSICAL EXAMINATION:  General:  Critically ill appearing male, NAD HEENT: [place]/AT, PERRL, EOM-I and MMM Neuro: Sedated, moving all ext to command CV:  RRR, Nl S1/S2 and -M/R/G PULM: Coarse BS diffusely GI: Soft, obese, NT, ND and +BS Extremities: cool (core warm) /dry, trace BLE edema  Skin: no rashes  PULMONARY Recent Labs  Lab 02/16/18 0351  02/18/18 0406  02/18/18 0910 02/18/18 1707 02/19/18 0330 02/20/18 0335  PHART 7.444   < > 7.506* 7.556* 7.451* 7.409 7.407  PCO2ART 42.7   < > 38.7 35.0 38.7 44.1 48.1*  PO2ART 74.0*   < > 65.0* 73.0* 97.0 87.5 85.3  HCO3 29.2*   < > 30.3* 30.8* 26.9 27.3 29.4*  TCO2 30  --  31 32 28  --   --   O2SAT 95.0   < > 93.0 96.0 98.0 95.6 95.9   < > = values in this interval not displayed.   CBC Recent Labs  Lab 02/18/18 0416 02/19/18 0503 02/20/18 0508  HGB 13.5 13.1 14.5  HCT 42.2 42.4 47.6  WBC 24.0* 25.3* 22.3*  PLT 575* 478* 465*   COAGULATION Recent Labs  Lab 02/14/18 0555  INR 2.04   CARDIAC  No results for input(s): TROPONINI in the last 168 hours. No results for input(s): PROBNP in the last 168 hours.  CHEMISTRY Recent Labs  Lab 02/15/18 0419 02/16/18 0521 02/17/18 0330 02/18/18 0416 02/19/18 0503 02/20/18 0508  NA 135 137 139 141 142 142  K 4.8 4.3 3.1* 3.9 3.7 3.6  CL 101 100 103 101 106 102  CO2 27 30 28  32 28 30  GLUCOSE 150* 164* 107* 120* 138* 181*  BUN 68* 85* 69* 78* 67* 72*  CREATININE 2.32* 2.06* 1.49* 1.83* 1.65* 2.14*  CALCIUM 8.1* 8.1* 6.7* 8.5* 8.1* 8.9  MG 2.8* 2.7* 1.7 2.4 2.3 2.2  PHOS 4.6  4.6 2.4* 2.9  --  4.8*   Estimated Creatinine Clearance: 39.3 mL/min (A) (by C-G formula based on SCr of 2.14 mg/dL (H)).  LIVER Recent Labs  Lab 02/14/18 0555 02/17/18 0330 02/18/18 0416 02/19/18 0503 02/19/18 1232 02/20/18 0508  AST 147* 50*  --  140* 157* 161*  ALT 294* 142*  --  225* 249* 312*  ALKPHOS 84 56  --  90 94 120  BILITOT 1.1 0.8  --  1.6* 1.6* 1.6*  PROT 6.6 5.1*  --  6.2* 6.5 7.1  ALBUMIN 2.1* 1.8* 2.3* 2.3* 2.2* 2.6*  INR 2.04  --   --   --   --   --    INFECTIOUS Recent Labs  Lab 02/14/18 0001  02/17/18 0330 02/18/18 0416 02/19/18 0503  LATICACIDVEN 1.2  --   --   --   --   PROCALCITON  --    < > 0.17 0.18 0.18   < > = values in this interval not displayed.   ENDOCRINE CBG (last 3)  Recent Labs    02/20/18 0009  02/20/18 0409 02/20/18 0722  GLUCAP 126* 127* 140*   IMAGING x48h  - image(s) personally visualized  -   highlighted in bold Dg Chest Port 1 View  Result Date: 02/20/2018 CLINICAL DATA:  Followup ventilator support EXAM: PORTABLE CHEST 1 VIEW COMPARISON:  02/19/2018 FINDINGS: Endotracheal tube tip is 3.5 cm above the carina. Nasogastric tube enters the abdomen. Right arm PICC tip is at the SVC RA junction. Artifact overlies the chest. Mild to moderate atelectasis in both lower lobes persists. IMPRESSION: Lines and tubes well positioned. Mild to moderate lower lobe atelectasis persists. Electronically Signed   By: Nelson Chimes M.D.   On: 02/20/2018 08:05   Dg Chest Port 1 View  Result Date: 02/19/2018 CLINICAL DATA:  Followup ventilator support EXAM: PORTABLE CHEST 1 VIEW COMPARISON:  02/19/2018 FINDINGS: Endotracheal tip is 2 cm above the carina. Nasogastric tube enters the abdomen. Right arm PICC tip is in the proximal right atrium. There is mild pulmonary edema. There are bilateral effusions with lower lobe atelectasis. IMPRESSION: No radiographic change. Lines and tubes well positioned. Persistent edema, effusions and lower lung volume loss. Electronically Signed   By: Nelson Chimes M.D.   On: 02/19/2018 13:03   Dg Chest Port 1 View  Result Date: 02/19/2018 CLINICAL DATA:  Respiratory failure. EXAM: PORTABLE CHEST 1 VIEW COMPARISON:  Radiograph of February 18, 2018. FINDINGS: Stable cardiomegaly. Endotracheal and nasogastric tubes are unchanged in position. Stable position of right-sided PICC line is noted. No pneumothorax is noted. Stable bilateral pulmonary edema is noted with associated pleural effusions. Bony thorax is unremarkable. IMPRESSION: Stable support apparatus. Stable bilateral pulmonary edema with associated pleural effusions. Electronically Signed   By: Marijo Conception, M.D.   On: 02/19/2018 08:34   LINES/TUBES: ETT 8/17>>> R PICC 8/17>>>  CULTURES: 8/7 BC x 2 >> neg 8/8 MRSA  PCR >> negatve 8/8 sputum cx >> neg 8/17 MRSA PCR >> positive  8/17 trach asp cx >> few MRSA 8/17 BCx2 >> 8/18 UC >> neg 8/20 trach asp >> MRSA 8/23 trach asp>> 8/23 BCx2 >> 8/23 UA >>  ANTIBIOTICS: 8/7 vanc >> 8/8; 8/17 >> 8/7 cefepime >> 8/15 8/11 azithromycin >> 8/17 >> 8/21 8/17 zosyn >> 8/20; 8/23 >> Rocephin 8/20>>>8/21  EVENTS 02/02/2018 - admit 8/8 - CT CT with new findings of Bilateral LL consolidation and RML and RUL infilrrates compated to  April 2018 8/17 -  tx ICU and intubation; Vancomycin and Zosyn started 8/17. Had new onset fever since admit  with rising WBC 20K.  cxr - stable iniflrtares PICC placed. 8/18 - Needing 60% fio2./peep 5 - fio2 worsening overnight. On fent gtt but oriented. Has trazazone  prn , xanax prn and lexapro scheduled , pamelor schedule, and sovorexant for insomnia .   On levophed 94mcg. Also on eliquis,  amio tab, cardiszem tab and lopressor table. Noted  allopurinol and colchicine; s/p 2 doses solumedrol yesterday.  Sons and daughter in law  rerport - significant diuresis in medical floor with fluid restriction.  Low NA worse to 129. AKI  worse 1.6 -> 2.6 . Also report dusky toes 8/21 Failed SBT this am x 2- no effort per pt.  RN reports patient goes from being completely  sedated to then wild, biting, and trying to pull at lines. Large BM yesterday after enema.  Was  on precedex 1.2 and fentanyl 350 this am, now tolerating precedex at 1 and fentanyl 150  Mcg/hr Neg 4L/ net -10L and at admit weight; Lasix gtt was held overnight and restarted at  7am 8/22  Ongoing agitation requiring fentanyl gtt 300 mcg/hr and precedex 1.2 with prn versed pushes  every 2-3 hours  (bitting tube, failing arms, reaching for tubes).  2 bowel movements  overnight Diuresed -5.4 L on lasix gtt at 5.  CXR this am notes ETT in right mainstem.   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  He also has a history of diastolic heart failure.  CTA showed patchy infiltrates on the right and no PE.  He was empirically treated with a combination of azithromycin and cefepime but developed acute dyspnea 8/17 requiring intubation.  He has been diuresed.  Sputum cx showing few MRSA PNA being treated with vanc.  Ongoing agitation has been a barrier to weaning.  Failed attempt at extubation 8/22.    ASSESSMENT / PLAN:  PULMONARY A:  Acute hypoxic Resp Failure  HCAP- MRSA PNA Hx OSA - Day 9/x of ETT - New diagnosis of PNA, on abx - Alkalotic on ABG  Plan: Begin PS trials not, no extubation until family decide on plan of care (trach vs one way extubation) Will likely attempt to extubate in AM Full vent support at night ABG and CXR in AM FiO2/ peep goal for sats 88-92%,  Pepcid for SUP, changed to PO to decrease volume See ID section below Pulmicort BID, duonebs prn q4   CARDIOVASCULAR A:  Diastolic HF Shock - possibly septic +/- cardiogenic , resolved; off pressors since 8/19 Hx Afib on prior eliquis, CAD, HTN - TTE 8/9 LVEF 50-55%; technically difficult study; no clear wall motion abnormalities Plan: Tele monitoring Map goal of 65 mmHg Hold amiodarone given LFTs rise Holding cardizem and lopressor ( SR maintaining 58-70's while on precedex), if increases then will add back Resume heparin drip  RENAL A:  AKI on CKD; hx of solitary kidney Mixed alkalosis  - sCr worse today, -1.2L/ 24, good UOP Plan:  Hold all diuretics at this time given renal function Trend BMP/ mag/ phos / urinary output/ daily wts Replace electrolytes as indicated BMET iN AM  GASTROINTESTINAL A:  NPO Constipation resolved, s/p BM 8/20, 8/21 Mild Transaminitis   Plan:  TF at goal 60 ml/hr Colace BID  Dulcolax PRN Trend LFTs  HEMATOLOGIC  A: Chronically on Eliquis  (Estimated Creatinine Clearance: 39.3 mL/min (A) (by C-G formula based on SCr of 2.14 mg/dL (H)). )  Recent Labs  Lab 02/16/18 0521 02/17/18 0330 02/18/18 0416 02/19/18 0503 02/20/18 0508  CREATININE 2.06* 1.49* 1.83* 1.65* 2.14*  P Resume heparin per pharmacy Monitor for further hematuria, no other bleeding noted Will transition back to Eliquis once extubated and taking PO  INFECTIOUS A:  MRSA PNA ?new infectious source given dramatic change in CXR with fever, worsening leukocytosis  Plan Continue vanc for goal of 8 days Pan cultures U/A Added empiric zosyn Cooling blanket for fever  Trend WBC/ fever curve   ENDOCRINE A:  Hx of transsphenoidal resection and thyroid replacement  Plan:  Continue synthroid daily Stop solumedrol Trend CBGs, add SSI if glucose > 180 x 2  NEUROLOGIC A:   Delirium -ongoing - home trazazone prn, xanax prn and lexapro scheduled , pamelor schedule, and sovorexant for insomnia  - do not think precedex is helping at this point, given additional prn versed requirements  Plan D/c precedex and versed prn  Add propofol and monitor triglyceride, if this doesn't work, ? Trying versed gtt Continue fentanyl gtt (quad strength to limit fluids) Continue klonopin 1mg  BID Add seroquel 50 mg BID, daily monitoring of QTc (today is 0.41) Bowel regimen with PAD protocol  Continue pamelol and lexapro Holding xanax, trazodone, sovorrexant  MSK A: Gout Plan:  Hold allopurinil Holding colchicine empirically, as sCr is elevated today  FAMILY Will need to meet with family prior to extubation to decide if on course post extubation, if to remain full then if reintubated then trach, if not then one way extubation.  The patient is critically ill with multiple organ systems failure and requires high complexity decision making for assessment and  support, frequent evaluation and titration of therapies, application of advanced monitoring technologies and extensive interpretation of multiple databases.   Critical Care Time devoted to patient care services described in this note is  33  Minutes. This time reflects time of care of this signee Dr [name]. This critical care time does not reflect procedure time, or teaching time or supervisory time of PA/NP/Med student/Med Resident etc but could involve care discussion time.  Rush Farmer, M.D. [facility] Pulmonary/Critical Care Medicine. Pager: [phone]. After hours pager: [phone].

## 2018-02-21 ENCOUNTER — Inpatient Hospital Stay (HOSPITAL_COMMUNITY): Payer: Medicare Other

## 2018-02-21 DIAGNOSIS — N179 Acute kidney failure, unspecified: Secondary | ICD-10-CM

## 2018-02-21 LAB — POCT I-STAT 3, ART BLOOD GAS (G3+)
ACID-BASE EXCESS: 3 mmol/L — AB (ref 0.0–2.0)
Bicarbonate: 28.3 mmol/L — ABNORMAL HIGH (ref 20.0–28.0)
O2 SAT: 97 %
PH ART: 7.393 (ref 7.350–7.450)
Patient temperature: 100.1
TCO2: 30 mmol/L (ref 22–32)
pCO2 arterial: 46.8 mmHg (ref 32.0–48.0)
pO2, Arterial: 100 mmHg (ref 83.0–108.0)

## 2018-02-21 LAB — CBC
HEMATOCRIT: 45.8 % (ref 39.0–52.0)
Hemoglobin: 14.3 g/dL (ref 13.0–17.0)
MCH: 29.2 pg (ref 26.0–34.0)
MCHC: 31.2 g/dL (ref 30.0–36.0)
MCV: 93.5 fL (ref 78.0–100.0)
Platelets: 427 10*3/uL — ABNORMAL HIGH (ref 150–400)
RBC: 4.9 MIL/uL (ref 4.22–5.81)
RDW: 15.8 % — AB (ref 11.5–15.5)
WBC: 21.4 10*3/uL — ABNORMAL HIGH (ref 4.0–10.5)

## 2018-02-21 LAB — GLUCOSE, CAPILLARY
GLUCOSE-CAPILLARY: 136 mg/dL — AB (ref 70–99)
Glucose-Capillary: 106 mg/dL — ABNORMAL HIGH (ref 70–99)
Glucose-Capillary: 109 mg/dL — ABNORMAL HIGH (ref 70–99)
Glucose-Capillary: 124 mg/dL — ABNORMAL HIGH (ref 70–99)
Glucose-Capillary: 149 mg/dL — ABNORMAL HIGH (ref 70–99)
Glucose-Capillary: 96 mg/dL (ref 70–99)

## 2018-02-21 LAB — HEPATITIS PANEL, ACUTE
HCV Ab: <0.1 s/co ratio (ref 0.0–0.9)
Hep A IgM: NEGATIVE
Hep B C IgM: NEGATIVE
Hepatitis B Surface Ag: NEGATIVE

## 2018-02-21 LAB — COMPREHENSIVE METABOLIC PANEL
ALBUMIN: 2.5 g/dL — AB (ref 3.5–5.0)
ALT: 290 U/L — ABNORMAL HIGH (ref 0–44)
ANION GAP: 9 (ref 5–15)
AST: 121 U/L — ABNORMAL HIGH (ref 15–41)
Alkaline Phosphatase: 125 U/L (ref 38–126)
BILIRUBIN TOTAL: 1.3 mg/dL — AB (ref 0.3–1.2)
BUN: 79 mg/dL — ABNORMAL HIGH (ref 8–23)
CO2: 30 mmol/L (ref 22–32)
Calcium: 8.7 mg/dL — ABNORMAL LOW (ref 8.9–10.3)
Chloride: 103 mmol/L (ref 98–111)
Creatinine, Ser: 1.93 mg/dL — ABNORMAL HIGH (ref 0.61–1.24)
GFR, EST AFRICAN AMERICAN: 37 mL/min — AB (ref >60)
GFR, EST NON AFRICAN AMERICAN: 32 mL/min — AB (ref >60)
Glucose, Bld: 157 mg/dL — ABNORMAL HIGH (ref 70–99)
POTASSIUM: 3.5 mmol/L (ref 3.5–5.1)
Sodium: 142 mmol/L (ref 135–145)
TOTAL PROTEIN: 6.8 g/dL (ref 6.5–8.1)

## 2018-02-21 LAB — PHOSPHORUS: Phosphorus: 4.1 mg/dL (ref 2.5–4.6)

## 2018-02-21 LAB — APTT: APTT: 86 s — AB (ref 24–36)

## 2018-02-21 LAB — MAGNESIUM: MAGNESIUM: 2.4 mg/dL (ref 1.7–2.4)

## 2018-02-21 LAB — HEPARIN LEVEL (UNFRACTIONATED)
HEPARIN UNFRACTIONATED: 1.52 [IU]/mL — AB (ref 0.30–0.70)
HEPARIN UNFRACTIONATED: 1.08 [IU]/mL — AB (ref 0.30–0.70)

## 2018-02-21 MED ORDER — POTASSIUM CHLORIDE 10 MEQ/50ML IV SOLN
10.0000 meq | INTRAVENOUS | Status: AC
  Administered 2018-02-21 (×4): 10 meq via INTRAVENOUS
  Filled 2018-02-21 (×4): qty 50

## 2018-02-21 MED ORDER — FUROSEMIDE 10 MG/ML IJ SOLN
40.0000 mg | Freq: Four times a day (QID) | INTRAMUSCULAR | Status: AC
  Administered 2018-02-21 (×3): 40 mg via INTRAVENOUS
  Filled 2018-02-21 (×4): qty 4

## 2018-02-21 MED ORDER — SENNA 8.6 MG PO TABS
1.0000 | ORAL_TABLET | Freq: Every day | ORAL | Status: DC
  Administered 2018-02-21 – 2018-02-26 (×6): 8.6 mg via ORAL
  Filled 2018-02-21 (×6): qty 1

## 2018-02-21 MED ORDER — HEPARIN (PORCINE) IN NACL 100-0.45 UNIT/ML-% IJ SOLN
1600.0000 [IU]/h | INTRAMUSCULAR | Status: DC
  Administered 2018-02-21: 1900 [IU]/h via INTRAVENOUS
  Filled 2018-02-21: qty 250

## 2018-02-21 NOTE — Progress Notes (Signed)
[facility] for Heparin  Indication: atrial fibrillation  Patient Measurements: Height: 5\' 10"  (177.8 cm) Weight: 272 lb 4.3 oz (123.5 kg) IBW/kg (Calculated) : 73 Heparin Dosing Weight: 106 kg  Vital Signs: Temp: 100 F (37.8 C) (08/26 1310) Temp Source: Bladder (08/26 0600) BP: 135/80 (08/26 1310) Pulse Rate: 99 (08/26 1310)  Labs: Recent Labs    02/19/18 0503 02/19/18 1623 02/20/18 0508 02/21/18 0446 02/21/18 1618  HGB 13.1  --  14.5 14.3  --   HCT 42.4  --  47.6 45.8  --   PLT 478*  --  465* 427*  --   APTT 139* 96* 88* 86*  --   HEPARINUNFRC 1.80* 1.44* 1.32* 1.52* 1.08*  CREATININE 1.65*  --  2.14* 1.93*  --    Assessment: 18 yoM admitted for PNA, on apixaban PTA for h/o afib and PE. Apixaban has been held (last dose 8/18) and switched to IV heparin. Have been dosing based on aPTT due to continued non-correlation of heparin level and aPTT. Although aPTT and heparin levels still not correlating, will now start dosing based on heparin level since it has been 8 days since last Eliquis dose and Eliquis should be cleared by now..  Evening heparin level still elevated at 1.08   Goal of Therapy:  Heparin level 0.3-0.7 units/ml Monitor platelets by anticoagulation protocol: Yes   Plan:  Decrease heparin to 1600 units / hr Check heparin level 6 hours after heparin decreased Daily heparin level / CBC Monitor s/sx of bleeding  Thank you Anette Guarneri, PharmD [phone] 02/21/18  4:51 PM

## 2018-02-21 NOTE — Progress Notes (Signed)
CSW continues to follow for further discharge needs once medically stable. CSW aware that pt remains on vent at 40 percent at this time. CSW will continue to be involved.   Wayne Booth, MSW, [facility] Clinical Social Worker [phone]

## 2018-02-21 NOTE — Procedures (Signed)
Extubation Procedure Note  Patient Details:   Name: Wayne Booth DOB: [birth year] MRN: [id number]   Airway Documentation:    Vent end date: 02/21/18 Vent end time: 1120   Evaluation  O2 sats: stable throughout Complications: No apparent complications Patient did tolerate procedure well. Bilateral Breath Sounds: Clear, Diminished   Yes   Pt extubated to 3L [place] per MD order. Pt had positive cuff leak prior to extubation. Pt stable throughout with no complications. VS within normal limits. Pt has a strong non productive cough post extubation. RT will continue to monitor.   Laymond Purser M 02/21/2018, 2:08 PM

## 2018-02-21 NOTE — Telephone Encounter (Signed)
FYI, see message. 

## 2018-02-21 NOTE — Progress Notes (Addendum)
PULMONARY / CRITICAL CARE MEDICINE   Name: Wayne Booth MRN: [id number] DOB: [birth year]    ADMISSION DATE:  02/02/2018 CONSULTATION DATE: 8/17/201  CHIEF COMPLAINT: Respiratory extremis   Brief 77 year old with a history of atrial fibrillation and diastolic dysfunction who presented 02/02/2018 with right-sided pleuritic chest pain fever and dyspnea.  He was treated with an empiric combination of azithromycin and cefepime and at the same time was diuresed for diastolic failure.  Overnight 02/11/18  he has had increasing dyspnea with midline chest pain is not clear whether or not this is been pleuritic; no radiation, no productive cough.  Patchy infiltrates on the CT scan of the chest obtained on 8/18 1 of them up appears consistent with a peripheral nodule.  He has a history of both prostate cancer and renal cancer and reported both of these have undergone curative treatment.  SUBJECTIVE/OVERNIGHT/INTERVAL HX Persistent agitation but able to wean some this AM  VITAL SIGNS: BP 128/74   Pulse 95   Temp 99.9 F (37.7 C)   Resp 18   Ht 5\' 10"  (1.778 m)   Wt 123.5 kg   SpO2 98%   BMI 39.07 kg/m   HEMODYNAMICS: CVP:  [3 mmHg-8 mmHg] 7 mmHg  VENTILATOR SETTINGS: Vent Mode: PRVC FiO2 (%):  [30 %-40 %] 30 % Set Rate:  [16 bmp] 16 bmp Vt Set:  [600 mL] 600 mL PEEP:  [5 cmH20] 5 cmH20 Pressure Support:  [5 cmH20] 5 cmH20 Plateau Pressure:  [21 cmH20-23 cmH20] 21 cmH20  INTAKE / OUTPUT: I/O last 3 completed shifts: In: 4368.5 [I.V.:1308.7; NG/GT:2310; IV Piggyback:749.9] Out: 5277 [Urine:7360; Emesis/NG output:30]  PHYSICAL EXAMINATION:  Constitution: Agitated, following commands HEENT: [place]/AT, PERRL, EOM-I and MMM Cardio: RRR, Nl S1/S2 and -M/R/G Respiratory: CTA bilaterally Abdominal: Soft, NT, ND and +BS MSK: +1 pitting edema, cool extremities Neuro: agitated, partially sedated, responds to stimuli  Skin: c/d/i, no rashes   PULMONARY Recent Labs  Lab 02/16/18 0351   02/18/18 0406 02/18/18 0910 02/18/18 1707 02/19/18 0330 02/20/18 0335 02/21/18 0314  PHART 7.444   < > 7.506* 7.556* 7.451* 7.409 7.407 7.393  PCO2ART 42.7   < > 38.7 35.0 38.7 44.1 48.1* 46.8  PO2ART 74.0*   < > 65.0* 73.0* 97.0 87.5 85.3 100.0  HCO3 29.2*   < > 30.3* 30.8* 26.9 27.3 29.4* 28.3*  TCO2 30  --  31 32 28  --   --  30  O2SAT 95.0   < > 93.0 96.0 98.0 95.6 95.9 97.0   < > = values in this interval not displayed.   CBC Recent Labs  Lab 02/19/18 0503 02/20/18 0508 02/21/18 0446  HGB 13.1 14.5 14.3  HCT 42.4 47.6 45.8  WBC 25.3* 22.3* 21.4*  PLT 478* 465* 427*   COAGULATION No results for input(s): INR in the last 168 hours. CARDIAC  No results for input(s): TROPONINI in the last 168 hours. No results for input(s): PROBNP in the last 168 hours.  CHEMISTRY Recent Labs  Lab 02/16/18 0521 02/17/18 0330 02/18/18 0416 02/19/18 0503 02/20/18 0508 02/21/18 0446  NA 137 139 141 142 142 142  K 4.3 3.1* 3.9 3.7 3.6 3.5  CL 100 103 101 106 102 103  CO2 30 28 32 28 30 30   GLUCOSE 164* 107* 120* 138* 181* 157*  BUN 85* 69* 78* 67* 72* 79*  CREATININE 2.06* 1.49* 1.83* 1.65* 2.14* 1.93*  CALCIUM 8.1* 6.7* 8.5* 8.1* 8.9 8.7*  MG 2.7* 1.7 2.4 2.3 2.2 2.4  PHOS 4.6 2.4* 2.9  --  4.8* 4.1   Estimated Creatinine Clearance: 43.6 mL/min (A) (by C-G formula based on SCr of 1.93 mg/dL (H)).  LIVER Recent Labs  Lab 02/17/18 0330 02/18/18 0416 02/19/18 0503 02/19/18 1232 02/20/18 0508 02/21/18 0446  AST 50*  --  140* 157* 161* 121*  ALT 142*  --  225* 249* 312* 290*  ALKPHOS 56  --  90 94 120 125  BILITOT 0.8  --  1.6* 1.6* 1.6* 1.3*  PROT 5.1*  --  6.2* 6.5 7.1 6.8  ALBUMIN 1.8* 2.3* 2.3* 2.2* 2.6* 2.5*   INFECTIOUS Recent Labs  Lab 02/17/18 0330 02/18/18 0416 02/19/18 0503  PROCALCITON 0.17 0.18 0.18   ENDOCRINE CBG (last 3)  Recent Labs    02/20/18 2330 02/21/18 0326 02/21/18 0737  GLUCAP 128* 136* 149*   IMAGING x48h  - image(s) personally  visualized  -   highlighted in bold Dg Chest Port 1 View  Result Date: 02/21/2018 CLINICAL DATA:  Endotracheal tube present.  Shortness of breath. EXAM: PORTABLE CHEST 1 VIEW COMPARISON:  02/20/2018 FINDINGS: Endotracheal tube is roughly 1.8 cm above the carina. Nasogastric tube is present and extends beyond the image. There continues to be bilateral airspace densities particularly at the lung bases. The patient is rotated and limited evaluation of the cardiac silhouette. Atherosclerotic calcifications at the aortic arch. Negative for a pneumothorax. Right arm PICC line is present and the tip is near the superior cavoatrial junction. IMPRESSION: Persistent bilateral airspace disease which is stable to slightly worse. Support apparatuses as described. Electronically Signed   By: Markus Daft M.D.   On: 02/21/2018 07:00   Dg Chest Port 1 View  Result Date: 02/20/2018 CLINICAL DATA:  Followup ventilator support EXAM: PORTABLE CHEST 1 VIEW COMPARISON:  02/19/2018 FINDINGS: Endotracheal tube tip is 3.5 cm above the carina. Nasogastric tube enters the abdomen. Right arm PICC tip is at the SVC RA junction. Artifact overlies the chest. Mild to moderate atelectasis in both lower lobes persists. IMPRESSION: Lines and tubes well positioned. Mild to moderate lower lobe atelectasis persists. Electronically Signed   By: Nelson Chimes M.D.   On: 02/20/2018 08:05   Dg Chest Port 1 View  Result Date: 02/19/2018 CLINICAL DATA:  Followup ventilator support EXAM: PORTABLE CHEST 1 VIEW COMPARISON:  02/19/2018 FINDINGS: Endotracheal tip is 2 cm above the carina. Nasogastric tube enters the abdomen. Right arm PICC tip is in the proximal right atrium. There is mild pulmonary edema. There are bilateral effusions with lower lobe atelectasis. IMPRESSION: No radiographic change. Lines and tubes well positioned. Persistent edema, effusions and lower lung volume loss. Electronically Signed   By: Nelson Chimes M.D.   On: 02/19/2018 13:03      . sodium chloride 10 mL/hr at 02/21/18 1000  . ceFTAROline (TEFLARO) IV 400 mg (02/21/18 1004)  . fentaNYL infusion INTRAVENOUS 25 mcg/hr (02/21/18 1000)  . heparin 1,900 Units/hr (02/21/18 0955)  . midazolam (VERSED) infusion Stopped (02/21/18 0421)    LINES/TUBES: ETT 8/17>>> R PICC 8/17>>>  CULTURES: 8/7 BC x 2 >> neg 8/8 MRSA PCR >> negatve 8/8 sputum cx >> neg 8/17 MRSA PCR >> positive  8/17 trach asp cx >> few MRSA 8/17 BCx2 >> 8/18 UC >> neg 8/20 trach asp >> MRSA 8/23 trach asp>> 8/23 BCx2 >> 8/23 UA >>  ANTIBIOTICS: 8/7 vanc >> 8/8; 8/17 >> 8/7 cefepime >> 8/15 8/11 azithromycin >> 8/17 >> 8/21 8/17 zosyn >> 8/20; 8/23 >> Rocephin 8/20>>>8/21  8/24 Ceftaroline >>  EVENTS 02/02/2018 - admit 8/8 - CT CT with new findings of Bilateral LL consolidation and RML and RUL infilrrates compated to  April 2018 8/17 - tx ICU and intubation; Vancomycin and Zosyn started 8/17. Had new onset fever since admit  with rising WBC 20K.  cxr - stable iniflrtares PICC placed. 8/18 - Needing 60% fio2./peep 5 - fio2 worsening overnight. On fent gtt but oriented. Has trazazone  prn , xanax prn and lexapro scheduled , pamelor schedule, and sovorexant for insomnia .   On levophed 72mcg. Also on eliquis,  amio tab, cardiszem tab and lopressor table. Noted  allopurinol and colchicine; s/p 2 doses solumedrol yesterday.  Sons and daughter in law  rerport - significant diuresis in medical floor with fluid restriction.  Low NA worse to 129. AKI  worse 1.6 -> 2.6 . Also report dusky toes 8/21 Failed SBT this am x 2- no effort per pt.  RN reports patient goes from being completely  sedated to then wild, biting, and trying to pull at lines. Large BM yesterday after enema.  Was  on precedex 1.2 and fentanyl 350 this am, now tolerating precedex at 1 and fentanyl 150  Mcg/hr Neg 4L/ net -10L and at admit weight; Lasix gtt was held overnight and restarted at  7am 8/22  Ongoing agitation requiring fentanyl  gtt 300 mcg/hr and precedex 1.2 with prn versed pushes  every 2-3 hours  (bitting tube, failing arms, reaching for tubes).  2 bowel movements  overnight Diuresed -5.4 L on lasix gtt at 5.  CXR this am notes ETT in right mainstem.   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  He also has a history of diastolic heart failure.  CTA showed patchy infiltrates on the right and no PE.  He was empirically treated with a combination of azithromycin and cefepime but developed acute dyspnea 8/17 requiring intubation.  He has been diuresed.  Sputum cx showing few MRSA PNA being treated with vanc.  Ongoing agitation has been a barrier to weaning.  Failed attempt at extubation 8/22.    ASSESSMENT / PLAN:  Acute hypoxic Resp Failure secondary to HCAP- MRSA PNA with Hx OSA Intubated 8/17, Day 10/x of ETT. New diagnosis of PNA, on abx, ID consulted Switched back to [facility] - RR 16 Vt 600 FiO2 40 PEEP 5. CXR with increased opacity.  Plan: Extubate one way today Gentle diuresis with AKI - lasix 8cc/hr gtt Replete K IV since will lose PO access post extubation OOB to chair PT IS Flutter valve Pepcid for SUP, changed to PO to decrease volume See ID section below Pulmicort BID, duonebs prn q4   Diastolic HF Septic +/- cardiogenic off pressors since 8/19 Hx Afib on prior eliquis, CAD, HTN - TTE 8/9 LVEF 50-55%; technically difficult study; no clear wall motion abnormalities  Goal MAP of 65 mmHg D/c amio due to transaminitis  Holding cardizem and lopressor ( SR maintaining 58-70's while on precedex)  Heparin drip  AKI on CKD; hx of solitary kidney: Cr 1.93 << 2.14  Change lasix to  IV pushes from drip at this point Trend BMP/ mag/ phos / urinary output/ daily wts  Replace electrolytes as indicated  Constipation resolved, s/p BM 8/23 Increasing Transaminitis: improving with amiodarone held Plan:  TF at goal 60 ml/hr Hold amio Colace BID  Dulcolax PRN Trend LFTs  Chronically on Eliquis  (Estimated Creatinine Clearance: 43.6 mL/min (A) (by C-G formula based on SCr of 1.93 mg/dL (H)). )  Recent Labs  Lab 02/17/18 0330 02/18/18 0416 02/19/18 0503 02/20/18 0508 02/21/18 0446  CREATININE 1.49* 1.83* 1.65* 2.14* 1.93*   Heparin per pharmacy Monitor for further hematuria, no other bleeding noted Hold eliquis for now  MRSA PNA New infectious source given dramatic change in CXR with fever, WBC count improving with ceftaroline Plan Ceftaroline per ID, day 3 Pan culture 8/23 still negative  Cooling blanket for fever  Trend WBC/ fever curve   Hx of transsphenoidal resection and thyroid replacement  Plan:  Continue synthroid daily Stop solumedrol Trend CBGs, add SSI if glucose > 180 x 2  Delirium -ongoing: currently sedated fentanyl 54mcg home trazazone prn, xanax prn and lexapro scheduled , pamelor schedule, and sovorexant for insomnia   Plan D/C all sedation for extubation today Continue klonopin 1mg  BID Cont seroquel 50 mg BID, daily monitoring of QTc (8/25 is 428) Bowel regimen with PAD protocol - senna/docasate  Continue pamelor 75mg  qd and lexapro 20mg  qd Holding xanax, trazodone, sovorexant  Gout  Hold allopurinil Holding colchicine empirically, as sCr is elevated today  Diet: NPO/TF VTE: heparin, VTE  IVF: none  FAMILY Spoke with family at length bedside, after discussion, will extubate today one way, if fails then will proceed with comfort measures.  Will make DNR per family discussion.  The patient is critically ill with multiple organ systems failure and requires high complexity decision making for assessment and support, frequent  evaluation and titration of therapies, application of advanced monitoring technologies and extensive interpretation of multiple databases.   Critical Care Time devoted to patient care services described in this note is  39  Minutes. This time reflects time of care of this signee Dr [name]. This critical care time does not reflect procedure time, or teaching time or supervisory time of PA/NP/Med student/Med Resident etc but could involve care discussion time.  Rush Farmer, M.D. [facility] Pulmonary/Critical Care Medicine. Pager: [phone]. After hours pager: [phone].

## 2018-02-21 NOTE — Telephone Encounter (Signed)
Thanks for the update- I spoke with son this evening and thanked him for opportunity to care for his father

## 2018-02-21 NOTE — Progress Notes (Signed)
Patient ID: Wayne Booth, male   DOB: 02/23/1941, 77 y.o.   MRN: [id number]          [facility]    Date of Admission:  02/02/2018    Total days of antibiotics 12        Day 3 ceftaroline  He remains on treatment for MSSA pneumonia.  He is afebrile leukocytosis.  Appears that decision has been made to extubate him today.  If his condition deteriorates the family has decided to transition to comfort measures only.         Michel Bickers, MD [facility] [place] [facility] [phone] pager   [phone] cell 02/21/2018, 10:00 AM

## 2018-02-21 NOTE — Progress Notes (Addendum)
PULMONARY / CRITICAL CARE MEDICINE   Name: Wayne Booth MRN: [id number] DOB: Dec 12, 1940    ADMISSION DATE:  02/02/2018 CONSULTATION DATE: 8/17/201  CHIEF COMPLAINT: Respiratory extremis   Brief 77 year old with a history of atrial fibrillation and diastolic dysfunction who presented 02/02/2018 with right-sided pleuritic chest pain fever and dyspnea.  He was treated with an empiric combination of azithromycin and cefepime and at the same time was diuresed for diastolic failure.  Overnight 02/11/18  he has had increasing dyspnea with midline chest pain is not clear whether or not this is been pleuritic; no radiation, no productive cough.  Patchy infiltrates on the CT scan of the chest obtained on 8/18 1 of them up appears consistent with a peripheral nodule.  He has a history of both prostate cancer and renal cancer and reported both of these have undergone curative treatment.  SUBJECTIVE/OVERNIGHT/INTERVAL HX Agitation overnight, continued this morning. On fentanyl 111mcg. Planning for SBT today.   VITAL SIGNS: BP 110/71   Pulse 89   Temp 99.7 F (37.6 C)   Resp 16   Ht 5\' 10"  (1.778 m)   Wt 123.5 kg   SpO2 100%   BMI 39.07 kg/m   HEMODYNAMICS: CVP:  [3 mmHg-8 mmHg] 6 mmHg  VENTILATOR SETTINGS: Vent Mode: PRVC FiO2 (%):  [35 %-40 %] 40 % Set Rate:  [16 bmp] 16 bmp Vt Set:  [600 mL] 600 mL PEEP:  [5 cmH20] 5 cmH20 Pressure Support:  [5 cmH20-10 cmH20] 5 cmH20 Plateau Pressure:  [23 cmH20] 23 cmH20  INTAKE / OUTPUT: I/O last 3 completed shifts: In: 4368.5 [I.V.:1308.7; NG/GT:2310; IV Piggyback:749.9] Out: 8341 [Urine:7360; Emesis/NG output:30]  PHYSICAL EXAMINATION:  Constitution: agitated, sedated, lying supine in bed  HEENT: [place]/AT, ET/OG in place Cardio: regular rate & rhythm, no m/r/g Respiratory: +wheezing, coarse breath sounds bilaterally, decreased on the left  Abdominal: hypoactive BS, soft, non-distended MSK: +1 pitting edema, cool extremities Neuro: agitated,  partially sedated, responds to stimuli  Skin: c/d/i, no rashes   PULMONARY Recent Labs  Lab 02/16/18 0351  02/18/18 0406 02/18/18 0910 02/18/18 1707 02/19/18 0330 02/20/18 0335 02/21/18 0314  PHART 7.444   < > 7.506* 7.556* 7.451* 7.409 7.407 7.393  PCO2ART 42.7   < > 38.7 35.0 38.7 44.1 48.1* 46.8  PO2ART 74.0*   < > 65.0* 73.0* 97.0 87.5 85.3 100.0  HCO3 29.2*   < > 30.3* 30.8* 26.9 27.3 29.4* 28.3*  TCO2 30  --  31 32 28  --   --  30  O2SAT 95.0   < > 93.0 96.0 98.0 95.6 95.9 97.0   < > = values in this interval not displayed.   CBC Recent Labs  Lab 02/19/18 0503 02/20/18 0508 02/21/18 0446  HGB 13.1 14.5 14.3  HCT 42.4 47.6 45.8  WBC 25.3* 22.3* 21.4*  PLT 478* 465* 427*   COAGULATION No results for input(s): INR in the last 168 hours. CARDIAC  No results for input(s): TROPONINI in the last 168 hours. No results for input(s): PROBNP in the last 168 hours.  CHEMISTRY Recent Labs  Lab 02/16/18 0521 02/17/18 0330 02/18/18 0416 02/19/18 0503 02/20/18 0508 02/21/18 0446  NA 137 139 141 142 142 142  K 4.3 3.1* 3.9 3.7 3.6 3.5  CL 100 103 101 106 102 103  CO2 30 28 32 28 30 30   GLUCOSE 164* 107* 120* 138* 181* 157*  BUN 85* 69* 78* 67* 72* 79*  CREATININE 2.06* 1.49* 1.83* 1.65* 2.14* 1.93*  CALCIUM 8.1* 6.7* 8.5* 8.1* 8.9 8.7*  MG 2.7* 1.7 2.4 2.3 2.2 2.4  PHOS 4.6 2.4* 2.9  --  4.8* 4.1   Estimated Creatinine Clearance: 43.6 mL/min (A) (by C-G formula based on SCr of 1.93 mg/dL (H)).  LIVER Recent Labs  Lab 02/17/18 0330 02/18/18 0416 02/19/18 0503 02/19/18 1232 02/20/18 0508 02/21/18 0446  AST 50*  --  140* 157* 161* 121*  ALT 142*  --  225* 249* 312* 290*  ALKPHOS 56  --  90 94 120 125  BILITOT 0.8  --  1.6* 1.6* 1.6* 1.3*  PROT 5.1*  --  6.2* 6.5 7.1 6.8  ALBUMIN 1.8* 2.3* 2.3* 2.2* 2.6* 2.5*   INFECTIOUS Recent Labs  Lab 02/17/18 0330 02/18/18 0416 02/19/18 0503  PROCALCITON 0.17 0.18 0.18   ENDOCRINE CBG (last 3)  Recent Labs     02/20/18 2012 02/20/18 2330 02/21/18 0326  GLUCAP 129* 128* 136*   IMAGING x48h  - image(s) personally visualized  -   highlighted in bold Dg Chest Port 1 View  Result Date: 02/21/2018 CLINICAL DATA:  Endotracheal tube present.  Shortness of breath. EXAM: PORTABLE CHEST 1 VIEW COMPARISON:  02/20/2018 FINDINGS: Endotracheal tube is roughly 1.8 cm above the carina. Nasogastric tube is present and extends beyond the image. There continues to be bilateral airspace densities particularly at the lung bases. The patient is rotated and limited evaluation of the cardiac silhouette. Atherosclerotic calcifications at the aortic arch. Negative for a pneumothorax. Right arm PICC line is present and the tip is near the superior cavoatrial junction. IMPRESSION: Persistent bilateral airspace disease which is stable to slightly worse. Support apparatuses as described. Electronically Signed   By: Markus Daft M.D.   On: 02/21/2018 07:00   Dg Chest Port 1 View  Result Date: 02/20/2018 CLINICAL DATA:  Followup ventilator support EXAM: PORTABLE CHEST 1 VIEW COMPARISON:  02/19/2018 FINDINGS: Endotracheal tube tip is 3.5 cm above the carina. Nasogastric tube enters the abdomen. Right arm PICC tip is at the SVC RA junction. Artifact overlies the chest. Mild to moderate atelectasis in both lower lobes persists. IMPRESSION: Lines and tubes well positioned. Mild to moderate lower lobe atelectasis persists. Electronically Signed   By: Nelson Chimes M.D.   On: 02/20/2018 08:05   Dg Chest Port 1 View  Result Date: 02/19/2018 CLINICAL DATA:  Followup ventilator support EXAM: PORTABLE CHEST 1 VIEW COMPARISON:  02/19/2018 FINDINGS: Endotracheal tip is 2 cm above the carina. Nasogastric tube enters the abdomen. Right arm PICC tip is in the proximal right atrium. There is mild pulmonary edema. There are bilateral effusions with lower lobe atelectasis. IMPRESSION: No radiographic change. Lines and tubes well positioned. Persistent  edema, effusions and lower lung volume loss. Electronically Signed   By: Nelson Chimes M.D.   On: 02/19/2018 13:03     . sodium chloride 10 mL/hr at 02/21/18 0700  . ceFTAROline (TEFLARO) IV Stopped (02/20/18 2217)  . fentaNYL infusion INTRAVENOUS 150 mcg/hr (02/21/18 0700)  . heparin 2,200 Units/hr (02/21/18 0700)  . midazolam (VERSED) infusion Stopped (02/21/18 0421)    LINES/TUBES: ETT 8/17>>> R PICC 8/17>>>  CULTURES: 8/7 BC x 2 >> neg 8/8 MRSA PCR >> negatve 8/8 sputum cx >> neg 8/17 MRSA PCR >> positive  8/17 trach asp cx >> few MRSA 8/17 BCx2 >> 8/18 UC >> neg 8/20 trach asp >> MRSA 8/23 trach asp>> 8/23 BCx2 >> 8/23 UA >>  ANTIBIOTICS: 8/7 vanc >> 8/8; 8/17 >> 8/7 cefepime >>  8/15 8/11 azithromycin >> 8/17 >> 8/21 8/17 zosyn >> 8/20; 8/23 >> Rocephin 8/20>>>8/21 8/24 Ceftaroline >>  EVENTS 02/02/2018 - admit 8/8 - CT CT with new findings of Bilateral LL consolidation and RML and RUL infilrrates compated to  April 2018 8/17 - tx ICU and intubation; Vancomycin and Zosyn started 8/17. Had new onset fever since admit  with rising WBC 20K.  cxr - stable iniflrtares PICC placed. 8/18 - Needing 60% fio2./peep 5 - fio2 worsening overnight. On fent gtt but oriented. Has trazazone  prn , xanax prn and lexapro scheduled , pamelor schedule, and sovorexant for insomnia .   On levophed 73mcg. Also on eliquis,  amio tab, cardiszem tab and lopressor table. Noted  allopurinol and colchicine; s/p 2 doses solumedrol yesterday.  Sons and daughter in law  rerport - significant diuresis in medical floor with fluid restriction.  Low NA worse to 129. AKI  worse 1.6 -> 2.6 . Also report dusky toes 8/21 Failed SBT this am x 2- no effort per pt.  RN reports patient goes from being completely  sedated to then wild, biting, and trying to pull at lines. Large BM yesterday after enema.  Was  on precedex 1.2 and fentanyl 350 this am, now tolerating precedex at 1 and fentanyl 150  Mcg/hr Neg 4L/ net -10L  and at admit weight; Lasix gtt was held overnight and restarted at  7am 8/22  Ongoing agitation requiring fentanyl gtt 300 mcg/hr and precedex 1.2 with prn versed pushes  every 2-3 hours  (bitting tube, failing arms, reaching for tubes).  2 bowel movements  overnight Diuresed -5.4 L on lasix gtt at 5.  CXR this am notes ETT in right mainstem.   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  He also has a history of diastolic heart failure.  CTA showed patchy infiltrates on the right and no PE.  He was empirically treated with a combination of azithromycin and cefepime but developed acute dyspnea 8/17 requiring intubation.  He has been diuresed.  Sputum cx showing few MRSA PNA being treated with vanc.  Ongoing agitation has been a barrier to weaning.  Failed attempt at extubation 8/22.    ASSESSMENT / PLAN:  Acute hypoxic Resp Failure secondary to HCAP- MRSA PNA with Hx OSA Intubated 8/17, Day 10/x of ETT. New diagnosis of PNA, on abx, ID consulted Switched back to [facility] - RR 16 Vt 600 FiO2 40 PEEP 5. CXR with increased opacity.  Plan: Plan for SBT and extubation today - ET tube 1.8cm above carina currently Gentle diuresis with AKI - lasix 8cc/hr gtt Replete K FiO2/ peep goal for sats 88-92%,  Pepcid for SUP, changed to PO to decrease volume See ID section below Pulmicort BID, duonebs prn q4   Diastolic HF Septic +/- cardiogenic off pressors since 8/19 Hx Afib on prior eliquis, CAD, HTN - TTE 8/9 LVEF 50-55%; technically difficult study; no clear wall motion abnormalities  Goal MAP of 65 mmHg D/c amio due to transaminitis  Holding cardizem and lopressor ( SR  maintaining 58-70's while on precedex)  Resume heparin drip  AKI on CKD; hx of solitary kidney: Cr 1.93 << 2.14  Gentle diuresis  with AKI  Trend BMP/ mag/ phos / urinary output/ daily wts Replace electrolytes as indicated  Constipation resolved, s/p BM 8/23 Increasing Transaminitis: improving with amiodarone held Plan:  TF at goal 60 ml/hr Hold amio Colace BID  Dulcolax PRN Trend LFTs   Chronically on Eliquis  (Estimated Creatinine Clearance: 43.6 mL/min (A) (by C-G formula based on SCr of 1.93 mg/dL (H)). )  Recent Labs  Lab 02/17/18 0330 02/18/18 0416 02/19/18 0503 02/20/18 0508 02/21/18 0446  CREATININE 1.49* 1.83* 1.65* 2.14* 1.93*   Cont Heparin drip per pharmacy Monitor for further hematuria, no other bleeding noted Will transition back to Eliquis once extubated and taking PO  MRSA PNA New infectious source given dramatic change in CXR with fever, WBC count improving with ceftaroline Plan Ceftaroline per ID, day 3 Pan culture 8/23 still negative  Cooling blanket for fever  Trend WBC/ fever curve   Hx of transsphenoidal resection and thyroid replacement  Plan:  Continue synthroid daily Stop solumedrol Trend CBGs, add SSI if glucose > 180 x 2  Delirium -ongoing: currently sedated fentanyl 83mcg home trazazone prn, xanax prn and lexapro scheduled , pamelor schedule, and sovorexant for insomnia   Plan D/c precedex and versed prn  Continue fentanyl gtt (quad strength to limit fluids) Continue klonopin 1mg  BID cont seroquel 50 mg BID, daily monitoring of QTc (8/25 is 428) Bowel regimen with PAD protocol - senna/docasate  Continue pamelor 75mg  qd and lexapro 20mg  qd Holding xanax, trazodone, sovorrexant  Gout  Hold allopurinil Holding colchicine empirically, as sCr is elevated today  Diet: NPO/TF VTE: heparin, VTE  IVF: none  FAMILY Family decided yesterday they do not believe patient would want trach. Extubation planned for today with transitions  to comfort if extubation unsuccessful.    Molli Hazard A, DO 02/21/2018, 7:13 AM Pager: [phone]

## 2018-02-21 NOTE — Progress Notes (Signed)
[place] for Heparin  Indication: atrial fibrillation  Patient Measurements: Height: 5\' 10"  (177.8 cm) Weight: 272 lb 4.3 oz (123.5 kg) IBW/kg (Calculated) : 73 Heparin Dosing Weight: 106 kg  Vital Signs: Temp: 99.9 F (37.7 C) (08/26 0800) Temp Source: Bladder (08/26 0600) BP: 138/77 (08/26 0800) Pulse Rate: 94 (08/26 0800)  Labs: Recent Labs    02/19/18 0503 02/19/18 1623 02/20/18 0508 02/21/18 0446  HGB 13.1  --  14.5 14.3  HCT 42.4  --  47.6 45.8  PLT 478*  --  465* 427*  APTT 139* 96* 88* 86*  HEPARINUNFRC 1.80* 1.44* 1.32* 1.52*  CREATININE 1.65*  --  2.14* 1.93*   Assessment: 54 yoM admitted for PNA, on apixaban PTA for h/o afib and PE. Apixaban has been held (last dose 8/18) and switched to IV heparin. Have been dosing based on aPTT due to continued non-correlation of heparin level and aPTT. Although aPTT and heparin levels still not correlating, will now start dosing based on heparin level since it has been 8 days since last Eliquis dose and Eliquis should be cleared by now.. Heparin level is elevated at 1.52. Hgb remains stable and WNL, PLTs 427. No s/sxs of bleeding.   Goal of Therapy:  Heparin level 0.3-0.7 units/ml Monitor platelets by anticoagulation protocol: Yes   Plan:  Hold heparin for 1 hour Decrease IV heparin gtt @ 1900 units/hr  Repeat heparin level 6 hours after restart @ 1530 Daily heparin level/aPTT and CBC Monitor s/sx of bleeding  Richardine Service, PharmD Candidate 02/21/18  8:32 AM

## 2018-02-21 NOTE — Progress Notes (Addendum)
Spoke with pt's wife [name] and son via telephone. Family does not wish to place NG tube for meds. Pt unable to sip water from cup or straw.

## 2018-02-22 ENCOUNTER — Inpatient Hospital Stay (HOSPITAL_COMMUNITY): Payer: Medicare Other

## 2018-02-22 DIAGNOSIS — R5381 Other malaise: Secondary | ICD-10-CM

## 2018-02-22 DIAGNOSIS — R509 Fever, unspecified: Secondary | ICD-10-CM

## 2018-02-22 DIAGNOSIS — Z960 Presence of urogenital implants: Secondary | ICD-10-CM

## 2018-02-22 DIAGNOSIS — I5043 Acute on chronic combined systolic (congestive) and diastolic (congestive) heart failure: Secondary | ICD-10-CM

## 2018-02-22 DIAGNOSIS — R5383 Other fatigue: Secondary | ICD-10-CM

## 2018-02-22 DIAGNOSIS — J15212 Pneumonia due to Methicillin resistant Staphylococcus aureus: Secondary | ICD-10-CM

## 2018-02-22 DIAGNOSIS — J181 Lobar pneumonia, unspecified organism: Secondary | ICD-10-CM

## 2018-02-22 DIAGNOSIS — Z95828 Presence of other vascular implants and grafts: Secondary | ICD-10-CM

## 2018-02-22 DIAGNOSIS — R05 Cough: Secondary | ICD-10-CM

## 2018-02-22 LAB — URINALYSIS, ROUTINE W REFLEX MICROSCOPIC
BILIRUBIN URINE: NEGATIVE
Glucose, UA: NEGATIVE mg/dL
Ketones, ur: NEGATIVE mg/dL
Nitrite: NEGATIVE
PROTEIN: NEGATIVE mg/dL
RBC / HPF: >50 RBC/hpf — ABNORMAL HIGH (ref 0–5)
SPECIFIC GRAVITY, URINE: 1.02 (ref 1.005–1.030)
pH: 5 (ref 5.0–8.0)

## 2018-02-22 LAB — APTT: aPTT: 46 seconds — ABNORMAL HIGH (ref 24–36)

## 2018-02-22 LAB — COMPREHENSIVE METABOLIC PANEL
ALK PHOS: 133 U/L — AB (ref 38–126)
ALT: 233 U/L — ABNORMAL HIGH (ref 0–44)
AST: 75 U/L — AB (ref 15–41)
Albumin: 2.8 g/dL — ABNORMAL LOW (ref 3.5–5.0)
Anion gap: 13 (ref 5–15)
BILIRUBIN TOTAL: 1.8 mg/dL — AB (ref 0.3–1.2)
BUN: 73 mg/dL — AB (ref 8–23)
CHLORIDE: 103 mmol/L (ref 98–111)
CO2: 30 mmol/L (ref 22–32)
Calcium: 9.7 mg/dL (ref 8.9–10.3)
Creatinine, Ser: 2.42 mg/dL — ABNORMAL HIGH (ref 0.61–1.24)
GFR, EST AFRICAN AMERICAN: 28 mL/min — AB (ref >60)
GFR, EST NON AFRICAN AMERICAN: 25 mL/min — AB (ref >60)
Glucose, Bld: 126 mg/dL — ABNORMAL HIGH (ref 70–99)
Potassium: 3.8 mmol/L (ref 3.5–5.1)
Sodium: 146 mmol/L — ABNORMAL HIGH (ref 135–145)
Total Protein: 7.6 g/dL (ref 6.5–8.1)

## 2018-02-22 LAB — GLUCOSE, CAPILLARY
GLUCOSE-CAPILLARY: 125 mg/dL — AB (ref 70–99)
GLUCOSE-CAPILLARY: 104 mg/dL — AB (ref 70–99)
Glucose-Capillary: 121 mg/dL — ABNORMAL HIGH (ref 70–99)
Glucose-Capillary: 105 mg/dL — ABNORMAL HIGH (ref 70–99)
Glucose-Capillary: 110 mg/dL — ABNORMAL HIGH (ref 70–99)

## 2018-02-22 LAB — CBC WITH DIFFERENTIAL/PLATELET
Basophils Absolute: 0.2 10*3/uL — ABNORMAL HIGH (ref 0.0–0.1)
Basophils Relative: 1 %
EOS PCT: 2 %
Eosinophils Absolute: 0.5 10*3/uL (ref 0.0–0.7)
HCT: 50.8 % (ref 39.0–52.0)
Hemoglobin: 16.2 g/dL (ref 13.0–17.0)
Lymphocytes Relative: 12 %
Lymphs Abs: 2.8 10*3/uL (ref 0.7–4.0)
MCH: 29.4 pg (ref 26.0–34.0)
MCHC: 31.9 g/dL (ref 30.0–36.0)
MCV: 92.2 fL (ref 78.0–100.0)
MONO ABS: 2.8 10*3/uL — AB (ref 0.1–1.0)
Monocytes Relative: 12 %
Neutro Abs: 17.3 10*3/uL — ABNORMAL HIGH (ref 1.7–7.7)
Neutrophils Relative %: 73 %
PLATELETS: 447 10*3/uL — AB (ref 150–400)
RBC: 5.51 MIL/uL (ref 4.22–5.81)
RDW: 16.2 % — AB (ref 11.5–15.5)
WBC: 23.6 10*3/uL — AB (ref 4.0–10.5)

## 2018-02-22 LAB — POCT I-STAT 3, ART BLOOD GAS (G3+)
Acid-Base Excess: 5 mmol/L — ABNORMAL HIGH (ref 0.0–2.0)
Bicarbonate: 29.5 mmol/L — ABNORMAL HIGH (ref 20.0–28.0)
O2 Saturation: 95 %
PH ART: 7.441 (ref 7.350–7.450)
TCO2: 31 mmol/L (ref 22–32)
pCO2 arterial: 44.1 mmHg (ref 32.0–48.0)
pO2, Arterial: 81 mmHg — ABNORMAL LOW (ref 83.0–108.0)

## 2018-02-22 LAB — HEPARIN LEVEL (UNFRACTIONATED)
Heparin Unfractionated: 0.65 IU/mL (ref 0.30–0.70)
Heparin Unfractionated: 0.81 IU/mL — ABNORMAL HIGH (ref 0.30–0.70)
Heparin Unfractionated: 1.11 IU/mL — ABNORMAL HIGH (ref 0.30–0.70)

## 2018-02-22 LAB — MAGNESIUM: MAGNESIUM: 2.2 mg/dL (ref 1.7–2.4)

## 2018-02-22 LAB — PHOSPHORUS: Phosphorus: 4.3 mg/dL (ref 2.5–4.6)

## 2018-02-22 MED ORDER — CHLORHEXIDINE GLUCONATE 0.12 % MT SOLN
15.0000 mL | Freq: Two times a day (BID) | OROMUCOSAL | Status: DC
  Administered 2018-02-22 – 2018-02-26 (×8): 15 mL via OROMUCOSAL
  Filled 2018-02-22 (×8): qty 15

## 2018-02-22 MED ORDER — ORAL CARE MOUTH RINSE
15.0000 mL | Freq: Two times a day (BID) | OROMUCOSAL | Status: DC
  Administered 2018-02-23 – 2018-02-25 (×4): 15 mL via OROMUCOSAL

## 2018-02-22 MED ORDER — HEPARIN (PORCINE) IN NACL 100-0.45 UNIT/ML-% IJ SOLN
1100.0000 [IU]/h | INTRAMUSCULAR | Status: DC
  Administered 2018-02-22: 1100 [IU]/h via INTRAVENOUS
  Administered 2018-02-22: 1300 [IU]/h via INTRAVENOUS
  Administered 2018-02-23: 1100 [IU]/h via INTRAVENOUS
  Filled 2018-02-22 (×2): qty 250

## 2018-02-22 NOTE — Progress Notes (Signed)
Wayne Booth  UUV:[id number] DOB: 11/07/1940 DOA: 02/02/2018 PCP: Marin Olp, MD    Reason for Consult / Chief Complaint:  Respiratory Extremis  Consulting MD:  Marveen Reeks, MD  HPI/Brief Narrative   77 year old with a history of atrial fibrillation and diastolic dysfunction who presented 02/02/2018 with right-sided pleuritic chest pain fever and dyspnea.  He was treated with an empiric combination of azithromycin and cefepime and at the same time was diuresed for diastolic failure.  Overnight 02/11/18  he has had increasing dyspnea with midline chest pain is not clear whether or not this is been pleuritic; no radiation, no productive cough.  Patchy infiltrates on the CT scan of the chest obtained on 8/18 1 of them up appears consistent with a peripheral nodule.  He has a history of both prostate cancer and renal cancer and reported both of these have undergone curative treatment.  Subjective  Patient on [place], arousable but somnolent. He is taking sips of water and requesting more. He is oriented and knows where he is. He denies pain and states he would like to go home.    Assessment & Plan:   Acute hypoxic Resp Failure secondary to HCAP- MRSA PNA with Hx OSA Intubated 8/17, Extubated 8/26. On [place] overnight with sats 99% & tachypnea.  Plan: Cont [place] SLP eval Hold lasix for AKI OOB to chair PT eval pending IS, Flutter valve Fomitidine per tube See ID section below Pulmicort BID, duonebs prn q4   Diastolic HF Septic +/- cardiogenic off pressors since 8/19 Hx Afib on prior eliquis, CAD, HTN - TTE 8/9 LVEF 50-55%; technically difficult study; no clear wall motion abnormalities  Goal MAP of 65 mmHg D/c amio due to transaminitis  Holding cardizem and lopressor ( SR maintaining 58-70's while on precedex)  Heparin drip  AKI on CKD; hx of solitary kidney: Cr 2.42 << 2.14 Hold lasix Trend BMP/ mag/ phos / urinary output/ daily wts Replace electrolytes as  indicated  Constipation resolved, s/p BM 8/23 Increasing Transaminitis: improving with amiodarone held Plan:  Hold amio Colace BID Dulcolax PRN Trend LFTs  Chronically on Eliquis  (Estimated Creatinine Clearance: 43.6 mL/min (A) (by C-G formula based on SCr of 1.93 mg/dL (H)). )  LastLabs         Recent Labs  Lab 02/17/18 0330 02/18/18 0416 02/19/18 0503 02/20/18 0508 02/21/18 0446  CREATININE 1.49* 1.83* 1.65* 2.14* 1.93*     Heparin per pharmacy Monitor for further hematuria, no other bleeding noted Hold eliquis for now  MRSA PNA New infectious source given dramatic change in CXR with fever, Leukocytosis increased, fever of 101.9 Plan Ceftaroline per ID, day 4 Pan culture 8/23 negative  Cooling blanket for fever  Trend WBC/ fever curve   Hx of transsphenoidal resection and thyroid replacement Plan:  Continue synthroid daily Stop solumedrol Trend CBGs , add SSI if glucose > 180 x 2  Delirium: resolved home trazazone prn, xanax prn and lexapro scheduled, pamelor schedule, and sovorexant for insomnia  Plan D/c seroquel, klonopin  Bowel regimen with PAD protocol - senna/docasate  Continue pamelor 75mg  qd and lexapro 20mg  qd Holding xanax, trazodone, sovorexant  Gout Hold allopurinil Holding colchicine empirically, as sCr is elevated today  Diet: NPO/TF VTE: heparin, VTE  IVF: none   Best Practice / Goals of Care / Disposition.   DVT prophylaxis: heparin GI prophylaxis: pepcid q12h Diet: NPO Mobility: bedbound Code Status: DNR Family Communication: Patient extubated yesterday. Family decided they did not  want tracheostomy and if patient has resp failure they would like to move toward comfort care.    Disposition / Summary of Today's Plan 02/22/18   Family at bedside. Pt satting well on [place] this morning but having fevers. Discussed previously that he would be switched to comfort care if he did not do well off the vent. He states this morning he  would like to go home as soon as possible and just wants to be able to see his grandchildren with   Consultants:  ID   LINES/TUBES: ETT 8/17>>> 8/26 R PICC 8/17>>>  Micro Data: CULTURES: 8/7 BC x 2 >> neg 8/8 MRSA PCR >> negatve 8/8 sputum cx >> neg 8/17 MRSA PCR >> positive  8/17 trach asp cx >> few MRSA 8/17 BCx2 >> 8/18 UC >> neg 8/20 trach asp >> MRSA 8/23 trach asp>> 8/23 BCx2 >> 8/23 UA >>  Antimicrobials:  8/7 vanc >> 8/8; 8/17 >> 8/7 cefepime >> 8/15 8/11 azithromycin >> 8/17 >> 8/21 8/17 zosyn >> 8/20; 8/23 >> Rocephin 8/20>>>8/21 8/24 Ceftaroline >>   Objective    Examination: General: somnolent but rousable to voice and speaking HEENT: atraumatic, normocephalic, 2L Junction in place Lungs: tachypnic, decreased breath sounds, no wheezing, rales, rhonchi Cardiovascular: tachycardic, regular rhythm, no m/r/g  Abdomen: hypoactive BS, NTTP, soft, non-distended Extremities: no edema, cool extremities lower b/l Neuro: Orientedx3, non-sedated  Blood pressure (!) 150/83, pulse (!) 108, temperature (!) 101.9 F (38.8 C), temperature source Rectal, resp. rate (!) 41, height 5\' 10"  (1.778 m), weight 123 kg, SpO2 95 %. CVP:  [4 mmHg-7 mmHg] 4 mmHg      Intake/Output Summary (Last 24 hours) at 02/22/2018 0839 Last data filed at 02/22/2018 0800 Gross per 24 hour  Intake 1437.72 ml  Output 4030 ml  Net -2592.28 ml   Filed Weights   02/20/18 0506 02/21/18 0200 02/22/18 0200  Weight: 123.6 kg 123.5 kg 123 kg     Labs   CBC: Recent Labs  Lab 02/16/18 0521  02/18/18 0416 02/19/18 0503 02/20/18 0508 02/21/18 0446 02/22/18 0555  WBC 20.7*   < > 24.0* 25.3* 22.3* 21.4* 23.6*  NEUTROABS 17.4*  --   --  21.2*  --   --  17.3*  HGB 12.8*   < > 13.5 13.1 14.5 14.3 16.2  HCT 39.5   < > 42.2 42.4 47.6 45.8 50.8  MCV 90.8   < > 90.6 93.0 94.1 93.5 92.2  PLT 639*   < > 575* 478* 465* 427* 447*   < > = values in this interval not displayed.   Basic Metabolic  Panel: Recent Labs  Lab 02/17/18 0330 02/18/18 0416 02/19/18 0503 02/20/18 0508 02/21/18 0446 02/22/18 0555  NA 139 141 142 142 142 146*  K 3.1* 3.9 3.7 3.6 3.5 3.8  CL 103 101 106 102 103 103  CO2 28 32 28 30 30 30   GLUCOSE 107* 120* 138* 181* 157* 126*  BUN 69* 78* 67* 72* 79* 73*  CREATININE 1.49* 1.83* 1.65* 2.14* 1.93* 2.42*  CALCIUM 6.7* 8.5* 8.1* 8.9 8.7* 9.7  MG 1.7 2.4 2.3 2.2 2.4 2.2  PHOS 2.4* 2.9  --  4.8* 4.1 4.3   GFR: Estimated Creatinine Clearance: 34.7 mL/min (A) (by C-G formula based on SCr of 2.42 mg/dL (H)). Recent Labs  Lab 02/17/18 0330 02/18/18 0416 02/19/18 0503 02/20/18 0508 02/21/18 0446 02/22/18 0555  PROCALCITON 0.17 0.18 0.18  --   --   --   WBC  20.7* 24.0* 25.3* 22.3* 21.4* 23.6*   Liver Function Tests: Recent Labs  Lab 02/19/18 0503 02/19/18 1232 02/20/18 0508 02/21/18 0446 02/22/18 0555  AST 140* 157* 161* 121* 75*  ALT 225* 249* 312* 290* 233*  ALKPHOS 90 94 120 125 133*  BILITOT 1.6* 1.6* 1.6* 1.3* 1.8*  PROT 6.2* 6.5 7.1 6.8 7.6  ALBUMIN 2.3* 2.2* 2.6* 2.5* 2.8*   No results for input(s): LIPASE, AMYLASE in the last 168 hours. No results for input(s): AMMONIA in the last 168 hours. ABG    Component Value Date/Time   PHART 7.441 02/22/2018 0744   PCO2ART 44.1 02/22/2018 0744   PO2ART 81.0 (L) 02/22/2018 0744   HCO3 29.5 (H) 02/22/2018 0744   TCO2 31 02/22/2018 0744   ACIDBASEDEF 4.0 (H) 02/13/2018 0337   O2SAT 95.0 02/22/2018 0744    Coagulation Profile: No results for input(s): INR, PROTIME in the last 168 hours. Cardiac Enzymes: No results for input(s): CKTOTAL, CKMB, CKMBINDEX, TROPONINI in the last 168 hours. HbA1C: Hgb A1c MFr Bld  Date/Time Value Ref Range Status  12/16/2017 04:27 PM 6.3 4.6 - 6.5 % Final    Comment:    Glycemic Control Guidelines for People with Diabetes:Non Diabetic:  <6%Goal of Therapy: <7%Additional Action Suggested:  >8%   04/12/2017 08:33 AM 6.1 4.6 - 6.5 % Final    Comment:     Glycemic Control Guidelines for People with Diabetes:Non Diabetic:  <6%Goal of Therapy: <7%Additional Action Suggested:  >8%    CBG: Recent Labs  Lab 02/21/18 1540 02/21/18 1931 02/21/18 2336 02/22/18 0338 02/22/18 0734  GLUCAP 109* 96 106* 121* 105*    . sodium chloride 10 mL/hr at 02/22/18 0900  . ceFTAROline (TEFLARO) IV 400 mg (02/22/18 0929)  . heparin 1,100 Units/hr (02/22/18 0940)    Review of Systems:    Please see Subjective  All other ROS wnl   Past medical history   Past Medical History:  Diagnosis Date  . Allergy    States his nose runs when he is around grease.   Marland Kitchen Anxiety   . Arthritis   . Atrial fibrillation ([place])   . Cancer ([place])   . Coronary artery disease   . History of total knee replacement   . Hypertension   . Hypothyroidism   . Kidney cancer, primary, with metastasis from kidney to other site [facility])   . Kidney stone   . OSA (obstructive sleep apnea)    pt does not wear cpap at night  . Pituitary cyst ([place])   . Pneumonia    hx of  . Prostate cancer [facility])    Social History   Social History   Socioeconomic History  . Marital status: Married    Spouse name: [name]  . Number of children: Y  . Years of education: Not on file  . Highest education level: Not on file  Occupational History  . Occupation: retired Armed forces operational officer: RETIRED  Social Needs  . Financial resource strain: Not on file  . Food insecurity:    Worry: Not on file    Inability: Not on file  . Transportation needs:    Medical: Not on file    Non-medical: Not on file  Tobacco Use  . Smoking status: Former Smoker    Years: 4.00    Types: Cigars  . Smokeless tobacco: Never Used  . Tobacco comment: QUIT 2 YEARS AGO   2017  Substance and Sexual Activity  . Alcohol use:  Yes    Alcohol/week: 2.0 standard drinks    Types: 2 Shots of liquor per week    Comment: socially  . Drug use: No  . Sexual activity: Not on file  Lifestyle  . Physical  activity:    Days per week: Not on file    Minutes per session: Not on file  . Stress: Not on file  Relationships  . Social connections:    Talks on phone: Not on file    Gets together: Not on file    Attends religious service: Not on file    Active member of club or organization: Not on file    Attends meetings of clubs or organizations: Not on file    Relationship status: Not on file  . Intimate partner violence:    Fear of current or ex partner: Not on file    Emotionally abused: Not on file    Physically abused: Not on file    Forced sexual activity: Not on file  Other Topics Concern  . Not on file  Social History Narrative   Married 1965 (wife patient of Dr. Megan Salon). 2 sons. 4 granddaughters.       Retired Chemical engineer.       Hobbies: golf, fishing, formerly hunting.     Family history    Family History  Problem Relation Age of Onset  . Cancer Father        ?mouth, died before patient born  . Cancer Mother        stomach, died when he was young  . Kidney cancer Sister   . Colon cancer Brother 30  . Esophageal cancer Neg Hx   . Rectal cancer Neg Hx   . Stomach cancer Neg Hx     Allergies Allergies  Allergen Reactions  . Ambien [Zolpidem] Anxiety    Anxiety and agitation when tried as sleeper in hospital aug 2019    Home meds  Prior to Admission medications   Medication Sig Start Date End Date Taking? Authorizing Provider  albuterol (PROVENTIL HFA;VENTOLIN HFA) 108 (90 Base) MCG/ACT inhaler Inhale 2 puffs into the lungs every 6 (six) hours as needed for wheezing or shortness of breath. 08/11/17  Yes Rigoberto Noel, MD  allopurinol (ZYLOPRIM) 300 MG tablet Take 0.5 tablets (150 mg total) by mouth daily. 12/07/17  Yes Rai, Ripudeep K, MD  amiodarone (PACERONE) 200 MG tablet Take 1 tablet (200 mg total) by mouth daily. 01/28/18  Yes Camnitz, Ocie Doyne, MD  apixaban (ELIQUIS) 5 MG TABS tablet Take 1 tablet (5 mg total) by mouth 2 (two) times daily. 11/03/17   Yes Georgette Shell, MD  budesonide-formoterol [facility]) 160-4.5 MCG/ACT inhaler Inhale 2 puffs into the lungs 2 (two) times daily as needed (for respiratory flares).   Yes [provider]  clotrimazole-betamethasone (LOTRISONE) cream Apply 1 application topically 2 (two) times daily. Use for 10 days maximum. Patient taking differently: Apply 1 application topically 2 (two) times daily as needed (for rashes).  08/27/16  Yes Marin Olp, MD  colchicine 0.6 MG tablet Take 1 tablet (0.6 mg total) by mouth daily. 12/07/17  Yes Rai, Ripudeep K, MD  cyclobenzaprine (FLEXERIL) 10 MG tablet Take 10 mg by mouth daily as needed for muscle spasms.  09/30/17  Yes [provider]  diltiazem (CARDIZEM CD) 240 MG 24 hr capsule Take 1 capsule (240 mg total) by mouth daily. 12/07/17  Yes Rai, Ripudeep K, MD  escitalopram (LEXAPRO) 20 MG tablet TAKE ONE  TABLET BY MOUTH DAILY 01/27/18  Yes Marin Olp, MD  furosemide (LASIX) 20 MG tablet Take 1 tablet (20 mg total) by mouth daily. 12/07/17  Yes Rai, Ripudeep K, MD  levothyroxine (SYNTHROID, LEVOTHROID) 150 MCG tablet TAKE ONE TABLET BY MOUTH DAILY 07/06/17  Yes Marin Olp, MD  metoprolol tartrate (LOPRESSOR) 25 MG tablet Take 0.5 tablets (12.5 mg total) by mouth 2 (two) times daily. 12/07/17  Yes Rai, Vernelle Emerald, MD  Multiple Vitamins-Minerals (MULTIVITAMINS THER. W/MINERALS) TABS Take 1 tablet by mouth daily.   Yes [provider]  nitroGLYCERIN (NITROSTAT) 0.4 MG SL tablet Place 1 tablet (0.4 mg total) under the tongue every 5 (five) minutes as needed for chest pain. 12/07/17  Yes Rai, Ripudeep K, MD  nortriptyline (PAMELOR) 75 MG capsule TAKE ONE CAPSULE BY MOUTH AT BEDTIME 12/31/17  Yes Marin Olp, MD  Suvorexant (BELSOMRA) 10 MG TABS Take 10 mg by mouth at bedtime. 12/27/17  Yes Marin Olp, MD     LOS: 20 days

## 2018-02-22 NOTE — Evaluation (Signed)
Clinical/Bedside Swallow Evaluation Patient Details  Name: Wayne Booth MRN: [id number] Date of Birth: March 08, 1941  Today's Date: 02/22/2018 Time: SLP Start Time (ACUTE ONLY): 1427 SLP Stop Time (ACUTE ONLY): 1504 SLP Time Calculation (min) (ACUTE ONLY): 37 min  Past Medical History:  Past Medical History:  Diagnosis Date  . Allergy    States his nose runs when he is around grease.   Marland Kitchen Anxiety   . Arthritis   . Atrial fibrillation ([place])   . Cancer ([place])   . Coronary artery disease   . History of total knee replacement   . Hypertension   . Hypothyroidism   . Kidney cancer, primary, with metastasis from kidney to other site [facility])   . Kidney stone   . OSA (obstructive sleep apnea)    pt does not wear cpap at night  . Pituitary cyst ([place])   . Pneumonia    hx of  . Prostate cancer [facility])    Past Surgical History:  Past Surgical History:  Procedure Laterality Date  . ABLATION OF DYSRHYTHMIC FOCUS  01/28/2018  . ANTERIOR CERVICAL DECOMP/DISCECTOMY FUSION N/A 08/29/2012   Procedure: ANTERIOR CERVICAL DECOMPRESSION/DISCECTOMY FUSION 1 LEVEL;  Surgeon: Eustace Moore, MD;  Location: [place] NEURO ORS;  Service: Neurosurgery;  Laterality: N/A;  . APPENDECTOMY    . ATRIAL FIBRILLATION ABLATION N/A 01/28/2018   Procedure: ATRIAL FIBRILLATION ABLATION;  Surgeon: Constance Haw, MD;  Location: [place] CV LAB;  Service: Cardiovascular;  Laterality: N/A;  . CARDIAC CATHETERIZATION  2008   clean  . CHOLECYSTECTOMY    . COLONOSCOPY W/ POLYPECTOMY    . CRANIOTOMY N/A 11/08/2012   Procedure: CRANIOTOMY HYPOPHYSECTOMY TRANSNASAL APPROACH;  Surgeon: Faythe Ghee, MD;  Location: [place] NEURO ORS;  Service: Neurosurgery;  Laterality: N/A;  Transphenoidal Resection of Pituitary Tumor  . FINGER SURGERY Left 2017   Dr Amedeo Plenty  . INSERTION PROSTATE RADIATION SEED    . KIDNEY STONE SURGERY    . KNEE ARTHROSCOPY  2007   left  . NEPHRECTOMY Left   . POSTERIOR CERVICAL LAMINECTOMY Left 04/24/2015    Procedure: Foraminotomy cervical five - cervical six cervical six - cervical seven left;  Surgeon: Eustace Moore, MD;  Location: [facility] NEURO ORS;  Service: Neurosurgery;  Laterality: Left;  . REPLACEMENT TOTAL KNEE BILATERAL     HPI:  Pt is a 77 y.o. male with hx of atrial fibrillation status post recent ablation, OSA, hypothyroidism, PE, hypertension, both renal and prostate cancer, and CAD. Presented to ED with SOB and right side pleuritic chest pain. Pt was one way extubated 02/21/18 (initially intubated 02/12/18), remains on feeding tube. CXR interval extubation of the trachea and esophagus. Slight interval improvement in bibasilar atelectasis or pneumonia. Slightly decreased pulmonary vascular congestion.   Assessment / Plan / Recommendation Clinical Impression  Pt demonstrated lingual and labial strength and ROM WFL, although initiation of lingual movements was slightly delayed. He exhibited significantly decreased velar elevation and xerostomia. Volitional cough was strong. Pt required moderate to maximum assistance to self-feed during presentation of po's. Anterior spillage was observed during thin sip from cup due to decreased labial seal. Pt displayed immediate cough following thin from straw with consecutive sips. SLP instructed to take 1 sip at a time using a straw, after which pt appeared to sucessfully transit thin without s/s of aspiration. No impairments were observed with presentations of puree from spoon. Pt exhibited pocketing of regular texture, particularly on left side. He attempted to clear pocketed po with  lingual sweep with limited success however straw sip thin effective in assisting clearance. SLP educated pt and family regarding aspiration precautions, compensatory strategies, importance of oral care and adherence to swallow strategies due to prolonged intubation. Given pt's clinical presentation today, family support and demonstration of learning with compensatory strategies, SLP  recommends dysphagia 2 (minced) diet with thin liquids. Straws sips allowed with single, small sips, checked for pocketed food following meals. ST will follow for reiteration of strategies and education re: diet. SLP Visit Diagnosis: Dysphagia, oropharyngeal phase (R13.12)    Aspiration Risk  Moderate aspiration risk    Diet Recommendation Dysphagia 2 (Fine chop);Thin liquid   Liquid Administration via: Straw;Cup Medication Administration: Whole meds with puree Supervision: Patient able to self feed;Full supervision/cueing for compensatory strategies;Staff to assist with self feeding Compensations: Slow rate;Small sips/bites;Lingual sweep for clearance of pocketing Postural Changes: Seated upright at 90 degrees    Other  Recommendations Oral Care Recommendations: Oral care BID   Follow up Recommendations None      Frequency and Duration min 1 x/week  2 weeks       Prognosis Prognosis for Safe Diet Advancement: (fair-good)      Swallow Study   General HPI: Pt is a 77 y.o. male with hx of atrial fibrillation status post recent ablation, OSA, hypothyroidism, PE, hypertension, both renal and prostate cancer, and CAD. Presented to ED with SOB and right side pleuritic chest pain. Pt was one way extubated 02/21/18 (initially intubated 02/12/18), remains on feeding tube. CXR interval extubation of the trachea and esophagus. Slight interval improvement in bibasilar atelectasis or pneumonia. Slightly decreased pulmonary vascular congestion. Type of Study: Bedside Swallow Evaluation Previous Swallow Assessment: (none) Diet Prior to this Study: NPO Temperature Spikes Noted: Yes Respiratory Status: Nasal cannula History of Recent Intubation: Yes Length of Intubations (days): 10 days Date extubated: 02/21/18 Behavior/Cognition: Alert;Cooperative;Pleasant mood;Requires cueing Oral Cavity Assessment: Dry Oral Care Completed by SLP: Yes Oral Cavity - Dentition: Dentures, top;Other  (Comment)(natural lower) Vision: Functional for self-feeding Self-Feeding Abilities: Needs assist Patient Positioning: Upright in bed Baseline Vocal Quality: Hoarse Volitional Cough: Strong    Oral/Motor/Sensory Function Overall Oral Motor/Sensory Function: Mild impairment Facial ROM: Within Functional Limits Facial Symmetry: Within Functional Limits Facial Strength: Within Functional Limits Facial Sensation: Within Functional Limits Lingual ROM: Within Functional Limits Lingual Symmetry: Within Functional Limits Lingual Strength: Within Functional Limits Lingual Sensation: Within Functional Limits Velum: Suspected CN X (Vagus) dysfunction;Other (comment)(no observable velum elevation) Mandible: Impaired   Ice Chips Ice chips: Not tested   Thin Liquid Thin Liquid: Impaired Presentation: Cup;Straw Oral Phase Impairments: Reduced lingual movement/coordination;Reduced labial seal Oral Phase Functional Implications: Right anterior spillage Pharyngeal  Phase Impairments: Cough - Immediate;Multiple swallows    Nectar Thick Nectar Thick Liquid: Not tested   Honey Thick Honey Thick Liquid: Not tested   Puree Puree: Within functional limits Presentation: Self Fed;Spoon   Solid     Solid: Impaired Presentation: Self Fed Oral Phase Impairments: Impaired mastication Oral Phase Functional Implications: Oral residue;Left lateral sulci pocketing Pharyngeal Phase Impairments: (none)      Lavora Brisbon, Orbie Pyo 02/22/2018,4:46 PM

## 2018-02-22 NOTE — Progress Notes (Signed)
ANTICOAGULATION CONSULT NOTE - Follow Up Consult  Pharmacy Consult for Heparin Indication: atrial fibrillation  Allergies  Allergen Reactions  . Ambien [Zolpidem] Anxiety    Anxiety and agitation when tried as sleeper in hospital aug 2019    Patient Measurements: Height: 5\' 10"  (177.8 cm) Weight: 271 lb 2.7 oz (123 kg) IBW/kg (Calculated) : 73 Heparin Dosing Weight: 106 kg  Vital Signs: Temp: 100.1 F (37.8 C) (08/27 1800) Temp Source: Rectal (08/27 1800) BP: 138/86 (08/27 1800) Pulse Rate: 101 (08/27 1800)  Labs: Recent Labs    02/20/18 0508 02/21/18 0446  02/21/18 2331 02/22/18 0555 02/22/18 1801  HGB 14.5 14.3  --   --  16.2  --   HCT 47.6 45.8  --   --  50.8  --   PLT 465* 427*  --   --  447*  --   APTT 88* 86*  --   --  46*  --   HEPARINUNFRC 1.32* 1.52*   < > 1.11* 0.81* 0.65  CREATININE 2.14* 1.93*  --   --  2.42*  --    < > = values in this interval not displayed.    Estimated Creatinine Clearance: 34.7 mL/min (A) (by C-G formula based on SCr of 2.42 mg/dL (H)).  Assessment: Anticoag: Eliquis PTA for Afib. Hx PE. On heparin gtt now >> mild hematuria (thought traumatic), remains pink tinged. HL 0.81 (using HL 8 days out for Eliquis) on 1300 units/hr. V/Q scan - low probability for PE.  - PM: HL 0.65 now in goal range.   Goal of Therapy:  Heparin level 0.3-0.7 units/ml Monitor platelets by anticoagulation protocol: Yes   Plan:  Continue IV heparin at 1100 units/hr HL/CBC in am  [place] Booth, PharmD, BCPS Clinical Staff Pharmacist Pager [phone]  [place] Booth 02/22/2018,7:08 PM

## 2018-02-22 NOTE — Progress Notes (Signed)
Patient ID: Wayne Booth, male   DOB: 1941/06/21, 77 y.o.   MRN: [id number]         [facility] for Infectious Disease  Date of Admission:  02/02/2018    Total days of antibiotics 13        Day 4 ceftaroline         ASSESSMENT: Overall he is looking better but he has developed renewed fever.  The source is uncertain to me.  I think his chest x-ray looks generally better over the past week and he was successfully extubated yesterday.  I recommend continuing ceftaroline at least overnight pending further observation and results of urine and blood cultures.  I will follow-up in the morning.  PLAN: 1. Continue ceftaroline for now 2. Await results of UA, urine and blood cultures  Active Problems:   History of renal cell carcinoma   Essential hypertension   OSA (obstructive sleep apnea)   Hypothyroidism   History of pulmonary embolism   Paroxysmal atrial fibrillation (HCC)   Acute respiratory failure with hypoxia (HCC)   HCAP (healthcare-associated pneumonia)   Pericardial effusion   SOB (shortness of breath)   Elevated troponin   Acute on chronic diastolic heart failure (HCC)   Dyspnea   Pneumonia of right middle lobe due to infectious organism ([place])   Scheduled Meds: . bacitracin-polymyxin b   Left Eye QHS  . budesonide (PULMICORT) nebulizer solution  0.5 mg Nebulization BID  . chlorhexidine gluconate (MEDLINE KIT)  15 mL Mouth Rinse BID  . Chlorhexidine Gluconate Cloth  6 each Topical Daily  . docusate  100 mg Per Tube Daily  . escitalopram  20 mg Per Tube Daily  . famotidine  20 mg Oral Daily  . levothyroxine  150 mcg Per Tube QAC breakfast  . mouth rinse  15 mL Mouth Rinse 10 times per day  . multivitamin with minerals  1 tablet Per Tube Daily  . nortriptyline  75 mg Per Tube QHS  . senna  1 tablet Oral Daily  . sodium chloride flush  10-40 mL Intracatheter Q12H   Continuous Infusions: . sodium chloride 10 mL/hr at 02/22/18 1300  . ceFTAROline (TEFLARO) IV  Stopped (02/22/18 1030)  . heparin 1,100 Units/hr (02/22/18 1300)   PRN Meds:.albuterol, ipratropium-albuterol, polyvinyl alcohol, sodium chloride flush   SUBJECTIVE: He is feeling better today.  He was extubated yesterday.  He says that he wants to try to get stronger so that he can go home as soon as possible and see his grandchildren.  Review of Systems: Review of Systems  Constitutional: Positive for fever and malaise/fatigue. Negative for chills and diaphoresis.  Respiratory: Positive for cough and shortness of breath. Negative for sputum production.   Cardiovascular: Negative for chest pain.  Gastrointestinal: Negative for abdominal pain, diarrhea, nausea and vomiting.  Musculoskeletal: Negative for joint pain.  Skin: Negative for rash.    Allergies  Allergen Reactions  . Ambien [Zolpidem] Anxiety    Anxiety and agitation when tried as sleeper in hospital aug 2019    OBJECTIVE: Vitals:   02/22/18 1100 02/22/18 1200 02/22/18 1300 02/22/18 1400  BP: (!) 141/85 (!) 141/86 (!) 141/88 (!) 148/88  Pulse: 100 99 (!) 106 (!) 107  Resp: (!) 38 (!) 34 (!) 25 (!) 24  Temp:      TempSrc:      SpO2: 98% 99% 98% 99%  Weight:      Height:       Body mass index is 38.[place]  kg/m.  Physical Exam  Constitutional: He is oriented to person, place, and time.  He is alert and pleasant sitting up in bed.  His wife is at the bedside.  Neck: Neck supple.  Cardiovascular: Normal rate, regular rhythm and normal heart sounds.  No murmur heard. Distant heart sounds.  Pulmonary/Chest: Effort normal and breath sounds normal.  He is using nasal cannula O2.  His lungs are clear anteriorly.  Abdominal: Soft. There is no tenderness.  Genitourinary:  Genitourinary Comments: He has a Foley catheter.  Musculoskeletal: Normal range of motion. He exhibits no edema or tenderness.  He has healed incisions from bilateral knee replacements.  Neurological: He is alert and oriented to person, place, and  time.  Skin: No rash noted.  His right arm PICC site and left forearm IV site both look good.  Psychiatric: He has a normal mood and affect.    Lab Results Lab Results  Component Value Date   WBC 23.6 (H) 02/22/2018   HGB 16.2 02/22/2018   HCT 50.8 02/22/2018   MCV 92.2 02/22/2018   PLT 447 (H) 02/22/2018    Lab Results  Component Value Date   CREATININE 2.42 (H) 02/22/2018   BUN 73 (H) 02/22/2018   NA 146 (H) 02/22/2018   K 3.8 02/22/2018   CL 103 02/22/2018   CO2 30 02/22/2018    Lab Results  Component Value Date   ALT 233 (H) 02/22/2018   AST 75 (H) 02/22/2018   ALKPHOS 133 (H) 02/22/2018   BILITOT 1.8 (H) 02/22/2018     Microbiology: Recent Results (from the past 240 hour(s))  MRSA PCR Screening     Status: Abnormal   Collection Time: 02/12/18  2:29 PM  Result Value Ref Range Status   MRSA by PCR POSITIVE (A) NEGATIVE Final    Comment:        The GeneXpert MRSA Assay (FDA approved for NASAL specimens only), is one component of a comprehensive MRSA colonization surveillance program. It is not intended to diagnose MRSA infection nor to guide or monitor treatment for MRSA infections. RESULT CALLED TO, READ BACK BY AND VERIFIED WITH: DEE DEE @ 1825 ON 02/12/18 BY ROBINSON Z,.    Culture, Urine     Status: None   Collection Time: 02/13/18 11:37 AM  Result Value Ref Range Status   Specimen Description URINE, RANDOM  Final   Special Requests NONE  Final   Culture   Final    NO GROWTH Performed at [facility], [address] [place], [place] [address]    Report Status 02/14/2018 FINAL  Final  Culture, respiratory (non-expectorated)     Status: None   Collection Time: 02/15/18  4:01 AM  Result Value Ref Range Status   Specimen Description TRACHEAL ASPIRATE  Final   Special Requests NONE  Final   Gram Stain   Final    ABUNDANT WBC PRESENT,BOTH PMN AND MONONUCLEAR ABUNDANT GRAM POSITIVE COCCI FEW YEAST Performed at [facility], [place]  [address] [place], [place] [address]    Culture   Final    ABUNDANT METHICILLIN RESISTANT STAPHYLOCOCCUS AUREUS   Report Status 02/17/2018 FINAL  Final   Organism ID, Bacteria METHICILLIN RESISTANT STAPHYLOCOCCUS AUREUS  Final      Susceptibility   Methicillin resistant staphylococcus aureus - MIC*    CIPROFLOXACIN >=8 RESISTANT Resistant     ERYTHROMYCIN >=8 RESISTANT Resistant     GENTAMICIN <=0.5 SENSITIVE Sensitive     OXACILLIN >=4  RESISTANT Resistant     TETRACYCLINE <=1 SENSITIVE Sensitive     VANCOMYCIN <=0.5 SENSITIVE Sensitive     TRIMETH/SULFA <=10 SENSITIVE Sensitive     CLINDAMYCIN <=0.25 SENSITIVE Sensitive     RIFAMPIN <=0.5 SENSITIVE Sensitive     Inducible Clindamycin NEGATIVE Sensitive     * ABUNDANT METHICILLIN RESISTANT STAPHYLOCOCCUS AUREUS  Culture, blood (routine x 2)     Status: None (Preliminary result)   Collection Time: 02/18/18 10:38 AM  Result Value Ref Range Status   Specimen Description BLOOD LEFT ANTECUBITAL  Final   Special Requests   Final    BOTTLES DRAWN AEROBIC AND ANAEROBIC Blood Culture adequate volume   Culture   Final    NO GROWTH 4 DAYS Performed at [facility], [address] [place], [place] [address]    Report Status PENDING  Incomplete  Culture, blood (routine x 2)     Status: None (Preliminary result)   Collection Time: 02/18/18 10:38 AM  Result Value Ref Range Status   Specimen Description BLOOD LEFT HAND  Final   Special Requests   Final    BOTTLES DRAWN AEROBIC ONLY Blood Culture results may not be optimal due to an inadequate volume of blood received in culture bottles   Culture   Final    NO GROWTH 4 DAYS Performed at [facility], [place] [address], [place], [place] [address]    Report Status PENDING  Incomplete  Culture, respiratory (non-expectorated)     Status: None   Collection Time: 02/18/18 12:00 PM  Result Value Ref Range Status   Specimen Description TRACHEAL ASPIRATE  Final   Special Requests NONE  Final    Gram Stain   Final    RARE WBC PRESENT,BOTH PMN AND MONONUCLEAR NO ORGANISMS SEEN Performed at [facility], [place] [address] [place], [place] [address]    Culture FEW CANDIDA ALBICANS  Final   Report Status 02/20/2018 FINAL  Final    Michel Bickers, MD [facility] [phone] pager   [phone] cell 02/22/2018, 2:15 PM

## 2018-02-22 NOTE — Progress Notes (Signed)
[facility] for Heparin  Indication: atrial fibrillation  Patient Measurements: Height: 5\' 10"  (177.8 cm) Weight: 271 lb 2.7 oz (123 kg) IBW/kg (Calculated) : 73 Heparin Dosing Weight: 106 kg  Vital Signs: Temp: 101.9 F (38.8 C) (08/27 0730) Temp Source: Rectal (08/27 0730) BP: 162/99 (08/27 0900) Pulse Rate: 105 (08/27 0900)  Labs: Recent Labs    02/20/18 0508 02/21/18 0446 02/21/18 1618 02/21/18 2331 02/22/18 0555  HGB 14.5 14.3  --   --  16.2  HCT 47.6 45.8  --   --  50.8  PLT 465* 427*  --   --  447*  APTT 88* 86*  --   --  46*  HEPARINUNFRC 1.32* 1.52* 1.08* 1.11* 0.81*  CREATININE 2.14* 1.93*  --   --  2.42*   Assessment: 77 yo M admitted for PNA, on apixaban PTA for h/o afib and PE. Last dose of apixaban on 8/18 before switching to IV heparin. Heparin was dosed based on aPTT until 8/26 due to continued non-correlation of heparin level and aPTT through 8/25. On 8/26, started dosing heparin based on heparin level despite non-correlation as it had been 8 days since last Eliquis dose. Heparin level consistently elevated; have been adjusting dose accordingly.  Heparin level still slightly elevated at 0.81. Level was drawn one hour early, but will adjust dose due to mild hematuria (pink tinge + dried blood around the area).    Goal of Therapy:  Heparin level 0.3-0.7 units/ml Monitor platelets by anticoagulation protocol: Yes   Plan:  Decrease heparin to 1100 units / hr Check heparin level 8 hours after heparin changed (> 77 yo) Daily heparin level / CBC Monitor s/sx of bleeding  Thank you Richardine Service, PharmD Candidate 02/22/18  9:55 AM

## 2018-02-22 NOTE — Progress Notes (Signed)
Nutrition Follow-up  DOCUMENTATION CODES:   Obesity unspecified  INTERVENTION:    If unable to safely advance PO diet, recommend Cortrak tube placement (service available M-W-F-Sat) for TF administration.  Jevity 1.2 at 65 ml/h with Pro-stat 30 ml TID would provide 2172 kcal, 132 gm protein, 1264 ml free water daily  NUTRITION DIAGNOSIS:   Inadequate oral intake related to inability to eat as evidenced by NPO status.  Ongoing  GOAL:   Patient will meet greater than or equal to 90% of their needs  Unmet  MONITOR:   Diet advancement, PO intake, Labs, I & O's  ASSESSMENT:   77 yo male with PMH of HTN, arthritis, hypothyroidism, OSA, prostate cancer, renal cancer, CAD, and A fib who was admitted on 8/7 with chest pain, fever, dyspnea, bilateral LL consolidation and RML & RUL infiltrates. Required intubation on 8/17.  Patient was extubated on 8/26 with no plans to re-intubate.  Remains NPO. Awaiting SLP evaluation per discussion with RN.  TF off since extubation.  Labs and medications reviewed. Sodium 146 (H) CBG's: 105-125 Weight down with diuresis I/O -23 L since admission  Diet Order:   Diet Order    None      EDUCATION NEEDS:   No education needs have been identified at this time  Skin:  Skin Assessment: Reviewed RN Assessment  Last BM:  8/27 (type 5)  Height:   Ht Readings from Last 1 Encounters:  02/12/18 5\' 10"  (1.778 m)    Weight:   Wt Readings from Last 1 Encounters:  02/22/18 123 kg    Ideal Body Weight:  75.5 kg  BMI:  Body mass index is 38.91 kg/m.  Estimated Nutritional Needs:   Kcal:  1900-2200  Protein:  120-140 gm  Fluid:  2 L    Molli Barrows, RD, LDN, [place] Pager [phone] After Hours Pager [phone]

## 2018-02-22 NOTE — Progress Notes (Signed)
Physical Therapy Re-Evaluation and Treatment Patient Details Name: Wayne Booth MRN: [id number] DOB: Mar 27, 1941 Today's Date: 02/22/2018    History of Present Illness 77 y.o. male with morbid obesity, atrial fibrillation s/p recent ablation, OSA noncompliant with CPAP, hypothyroidism, prior PE, renal and prostate cancer s/p nephrectomy here with multilobar pneumonia and acute on chronic diastolic heart failure. Transfered to ICU and intubated 8/17-8/22 when he was extubated, failed BiPAP use and was reintubated.  Extubated on 8/26. PT reordered.    PT Comments    Pt has had a decrease in function after 10 days of intubation with limited activity. Pt extremities are stiff and pt grimaces with movement. Pt currently requires maxAx2 for transfer to seated EoB. Pt sat EoB for 10 minutes progressing from maxAx2 to steady to min guard for 10 seconds and then quickly fatiguing and again requiring maxAx2 to steady and then Total Ax2 to return to supine. PT continues to recommend SNF level rehab at d/c. PT will continue to follow acutely.   Follow Up Recommendations  SNF;Supervision/Assistance - 24 hour     Equipment Recommendations  Other (comment)(TBD at next venue)    Recommendations for Other Services       Precautions / Restrictions Precautions Precautions: Fall Precaution Comments: O2 dependent; watch SpO2 Restrictions Weight Bearing Restrictions: No    Mobility  Bed Mobility Overal bed mobility: Needs Assistance Bed Mobility: Supine to Sit;Sit to Supine     Supine to sit: Max assist;+2 for physical assistance Sit to supine: Total assist;+2 for physical assistance   General bed mobility comments: pt requires maxAx2 for trunk to upright and LE management off of bed, pt with some grimacing with movement of LE, after sitting EoB pt fatigued requiring total Ax2 to return to supine                 Balance Overall balance assessment: Needs assistance Sitting-balance  support: Bilateral upper extremity supported;Feet supported Sitting balance-Leahy Scale: Poor Sitting balance - Comments: pt initially requires maxAx2 for maintaining balance, able to progress to min guard x 10 sec and then quickly returned to requiring maxAx2 as pt fatigued                                    Cognition Arousal/Alertness: Lethargic Behavior During Therapy: Flat affect Overall Cognitive Status: Impaired/Different from baseline Area of Impairment: Problem solving;Safety/judgement                         Safety/Judgement: Decreased awareness of safety;Decreased awareness of deficits   Problem Solving: Slow processing;Decreased initiation;Difficulty sequencing;Requires verbal cues;Requires tactile cues General Comments: exhibited some slow processing and decreased command following suspect due to lethargy from prolonged intubation         General Comments General comments (skin integrity, edema, etc.): Family present and very supportive during session, pt with increased RR (33bpm) with sitting EoB, VSS, pt on 2L O2 via [place] maintaining >95%O2        Pertinent Vitals/Pain Pain Assessment: Faces Faces Pain Scale: Hurts even more Pain Location: generalized Pain Descriptors / Indicators: Grimacing;Guarding;Sore Pain Intervention(s): Limited activity within patient's tolerance;Monitored during session;Repositioned    Home Living Family/patient expects to be discharged to:: Private residence Living Arrangements: Spouse/significant other   Type of Home: House Home Access: Level entry   Home Layout: Two level;Able to live on main level with bedroom/bathroom Home Equipment: Kasandra Knudsen -  single point      Prior Function Level of Independence: Independent          PT Goals (current goals can now be found in the care plan section) Acute Rehab PT Goals Patient Stated Goal: to return to independent PT Goal Formulation: With patient/family Time For Goal  Achievement: 03/08/18 Potential to Achieve Goals: Fair    Frequency    Min 3X/week      PT Plan         AM-PAC PT "6 Clicks" Daily Activity  Outcome Measure  Difficulty turning over in bed (including adjusting bedclothes, sheets and blankets)?: Unable Difficulty moving from lying on back to sitting on the side of the bed? : Unable Difficulty sitting down on and standing up from a chair with arms (e.g., wheelchair, bedside commode, etc,.)?: Unable Help needed moving to and from a bed to chair (including a wheelchair)?: Total Help needed walking in hospital room?: Total Help needed climbing 3-5 steps with a railing? : Total 6 Click Score: 6    End of Session Equipment Utilized During Treatment: Oxygen Activity Tolerance: Patient limited by fatigue;Patient limited by lethargy Patient left: in bed;with call bell/phone within reach;with family/visitor present Nurse Communication: Mobility status PT Visit Diagnosis: Other abnormalities of gait and mobility (R26.89);Muscle weakness (generalized) (M62.81)     Time: 4098-1191 PT Time Calculation (min) (ACUTE ONLY): 33 min  Charges:  $Therapeutic Activity: 23-37 mins                     Talli Kimmer B. Migdalia Dk PT, DPT Acute Rehabilitation  [phone] Pager [phone]     [place] 02/22/2018, 1:29 PM

## 2018-02-22 NOTE — Progress Notes (Signed)
ANTICOAGULATION CONSULT NOTE  Pharmacy Consult for Heparin  Indication: atrial fibrillation  Patient Measurements: Height: 5\' 10"  (177.8 cm) Weight: 272 lb 4.3 oz (123.5 kg) IBW/kg (Calculated) : 73 Heparin Dosing Weight: 106 kg  Vital Signs: Temp: 101.3 F (38.5 C) (08/26 2200) Temp Source: Bladder (08/26 2000) BP: 146/84 (08/26 2300) Pulse Rate: 103 (08/26 2300)  Labs: Recent Labs    02/19/18 0503 02/19/18 1623 02/20/18 0508 02/21/18 0446 02/21/18 1618 02/21/18 2331  HGB 13.1  --  14.5 14.3  --   --   HCT 42.4  --  47.6 45.8  --   --   PLT 478*  --  465* 427*  --   --   APTT 139* 96* 88* 86*  --   --   HEPARINUNFRC 1.80* 1.44* 1.32* 1.52* 1.08* 1.11*  CREATININE 1.65*  --  2.14* 1.93*  --   --    Assessment: 72 yoM admitted for PNA, on apixaban PTA for h/o afib and PE. Apixaban has been held (last dose 8/18) and switched to IV heparin. Have been dosing based on aPTT due to continued non-correlation of heparin level and aPTT. Although aPTT and heparin levels still not correlating, will now start dosing based on heparin level since it has been 8 days since last Eliquis dose and Eliquis should be cleared by now.. Heparin level still elevated at 1.11.  Some mild hematuria noted.   Goal of Therapy:  Heparin level 0.3-0.7 units/ml Monitor platelets by anticoagulation protocol: Yes   Plan:  Hold heparin for 30 min the decrease heparin to 1300 units / hr Check heparin level 6 hours after heparin restarted Daily heparin level / CBC Monitor s/sx of bleeding  Thank you Excell Seltzer, PharmD 02/22/18  12:11 AM

## 2018-02-22 NOTE — Care Management (Signed)
Pt one way extubated yesterday - currently on [place].  Pt remains on IV hep, tube feed and IV antibiotics.  If pt declines may move towards comfort care.

## 2018-02-23 DIAGNOSIS — J9690 Respiratory failure, unspecified, unspecified whether with hypoxia or hypercapnia: Secondary | ICD-10-CM

## 2018-02-23 LAB — CULTURE, BLOOD (ROUTINE X 2)
CULTURE: NO GROWTH
Culture: NO GROWTH
SPECIAL REQUESTS: ADEQUATE

## 2018-02-23 LAB — URINE CULTURE: CULTURE: NO GROWTH

## 2018-02-23 LAB — CBC WITH DIFFERENTIAL/PLATELET
ABS IMMATURE GRANULOCYTES: 0.7 10*3/uL — AB (ref 0.0–0.1)
Basophils Absolute: 0.1 10*3/uL (ref 0.0–0.1)
Basophils Relative: 0 %
EOS ABS: 1 10*3/uL — AB (ref 0.0–0.7)
Eosinophils Relative: 5 %
HEMATOCRIT: 47 % (ref 39.0–52.0)
HEMOGLOBIN: 15 g/dL (ref 13.0–17.0)
IMMATURE GRANULOCYTES: 3 %
LYMPHS ABS: 2.2 10*3/uL (ref 0.7–4.0)
LYMPHS PCT: 10 %
MCH: 29.5 pg (ref 26.0–34.0)
MCHC: 31.9 g/dL (ref 30.0–36.0)
MCV: 92.5 fL (ref 78.0–100.0)
MONOS PCT: 11 %
Monocytes Absolute: 2.3 10*3/uL — ABNORMAL HIGH (ref 0.1–1.0)
NEUTROS PCT: 71 %
Neutro Abs: 14.8 10*3/uL — ABNORMAL HIGH (ref 1.7–7.7)
Platelets: 351 10*3/uL (ref 150–400)
RBC: 5.08 MIL/uL (ref 4.22–5.81)
RDW: 15.7 % — AB (ref 11.5–15.5)
WBC: 21 10*3/uL — ABNORMAL HIGH (ref 4.0–10.5)

## 2018-02-23 LAB — GLUCOSE, CAPILLARY
Glucose-Capillary: 119 mg/dL — ABNORMAL HIGH (ref 70–99)
Glucose-Capillary: 132 mg/dL — ABNORMAL HIGH (ref 70–99)

## 2018-02-23 LAB — COMPREHENSIVE METABOLIC PANEL
ALBUMIN: 2.6 g/dL — AB (ref 3.5–5.0)
ALK PHOS: 107 U/L (ref 38–126)
ALT: 155 U/L — AB (ref 0–44)
AST: 46 U/L — AB (ref 15–41)
Anion gap: 11 (ref 5–15)
BILIRUBIN TOTAL: 1.3 mg/dL — AB (ref 0.3–1.2)
BUN: 67 mg/dL — AB (ref 8–23)
CO2: 29 mmol/L (ref 22–32)
CREATININE: 1.94 mg/dL — AB (ref 0.61–1.24)
Calcium: 8.9 mg/dL (ref 8.9–10.3)
Chloride: 101 mmol/L (ref 98–111)
GFR calc Af Amer: 37 mL/min — ABNORMAL LOW (ref >60)
GFR, EST NON AFRICAN AMERICAN: 32 mL/min — AB (ref >60)
GLUCOSE: 126 mg/dL — AB (ref 70–99)
Potassium: 3.2 mmol/L — ABNORMAL LOW (ref 3.5–5.1)
Sodium: 141 mmol/L (ref 135–145)
TOTAL PROTEIN: 6.8 g/dL (ref 6.5–8.1)

## 2018-02-23 LAB — HEPARIN LEVEL (UNFRACTIONATED): Heparin Unfractionated: 0.42 IU/mL (ref 0.30–0.70)

## 2018-02-23 LAB — PHOSPHORUS: Phosphorus: 5.4 mg/dL — ABNORMAL HIGH (ref 2.5–4.6)

## 2018-02-23 LAB — MAGNESIUM: MAGNESIUM: 2.4 mg/dL (ref 1.7–2.4)

## 2018-02-23 MED ORDER — ADULT MULTIVITAMIN W/MINERALS CH
1.0000 | ORAL_TABLET | Freq: Every day | ORAL | Status: DC
  Administered 2018-02-23 – 2018-02-26 (×4): 1 via ORAL
  Filled 2018-02-23 (×5): qty 1

## 2018-02-23 MED ORDER — DOCUSATE SODIUM 100 MG PO CAPS
100.0000 mg | ORAL_CAPSULE | Freq: Every day | ORAL | Status: DC
  Administered 2018-02-23 – 2018-02-26 (×4): 100 mg via ORAL
  Filled 2018-02-23 (×4): qty 1

## 2018-02-23 MED ORDER — POTASSIUM CHLORIDE 10 MEQ/50ML IV SOLN
10.0000 meq | INTRAVENOUS | Status: DC
  Administered 2018-02-23 (×2): 10 meq via INTRAVENOUS
  Filled 2018-02-23 (×5): qty 50

## 2018-02-23 MED ORDER — APIXABAN 5 MG PO TABS
5.0000 mg | ORAL_TABLET | Freq: Two times a day (BID) | ORAL | Status: DC
  Administered 2018-02-23 – 2018-02-26 (×7): 5 mg via ORAL
  Filled 2018-02-23 (×8): qty 1

## 2018-02-23 MED ORDER — POTASSIUM CHLORIDE 20 MEQ/15ML (10%) PO SOLN: 20.0000 meq | Freq: Three times a day (TID) | ORAL | Status: DC

## 2018-02-23 MED ORDER — NORTRIPTYLINE HCL 25 MG PO CAPS
75.0000 mg | ORAL_CAPSULE | Freq: Every day | ORAL | Status: DC
  Administered 2018-02-23 – 2018-02-25 (×3): 75 mg via ORAL
  Filled 2018-02-23 (×4): qty 3

## 2018-02-23 MED ORDER — LEVOTHYROXINE SODIUM 75 MCG PO TABS
150.0000 ug | ORAL_TABLET | Freq: Every day | ORAL | Status: DC
  Administered 2018-02-24 – 2018-02-26 (×3): 150 ug via ORAL
  Filled 2018-02-23 (×3): qty 2

## 2018-02-23 MED ORDER — POTASSIUM CHLORIDE CRYS ER 20 MEQ PO TBCR
20.0000 meq | EXTENDED_RELEASE_TABLET | Freq: Three times a day (TID) | ORAL | Status: DC
  Administered 2018-02-23: 20 meq via ORAL
  Filled 2018-02-23: qty 1

## 2018-02-23 MED ORDER — ESCITALOPRAM OXALATE 20 MG PO TABS
20.0000 mg | ORAL_TABLET | Freq: Every day | ORAL | Status: DC
  Administered 2018-02-23 – 2018-02-26 (×4): 20 mg via ORAL
  Filled 2018-02-23 (×4): qty 1

## 2018-02-23 MED ORDER — POTASSIUM CHLORIDE CRYS ER 20 MEQ PO TBCR
20.0000 meq | EXTENDED_RELEASE_TABLET | Freq: Three times a day (TID) | ORAL | Status: AC
  Administered 2018-02-23 (×2): 20 meq via ORAL
  Filled 2018-02-23 (×2): qty 1

## 2018-02-23 NOTE — Progress Notes (Signed)
[facility] Progress Note Patient Name: MURRAY DURRELL DOB: [birth year] MRN: [id number]   Date of Service  02/23/2018  HPI/Events of Note  K+ = 3.2 and Creatinine = 1.94.  eICU Interventions  Will replace K+.     Intervention Category Major Interventions: Electrolyte abnormality - evaluation and management  Madylin Fairbank [place] 02/23/2018, 5:57 AM

## 2018-02-23 NOTE — Progress Notes (Signed)
[facility] ADULT ICU REPLACEMENT PROTOCOL FOR AM LAB REPLACEMENT ONLY  The patient does not apply for the [facility] Adult ICU Electrolyte Replacment Protocol based on the criteria listed below:  Is GFR >/= 40 ml/min? No.  Patient's GFR today is 32   Is BUN < 60 mg/dL? No.  Patient's BUN today is 67 abnormal electrolyte(s): K3.2  If a panic level lab has been reported, has the CCM MD in charge been notified? Yes.  .   Physician:  S Sommer,MD  Vear Clock 02/23/2018 5:51 AM

## 2018-02-23 NOTE — Progress Notes (Signed)
[place] for Changing Heparin to Apixaban  Indication: atrial fibrillation  Patient Measurements: Height: 5\' 10"  (177.8 cm) Weight: 270 lb 1 oz (122.5 kg) IBW/kg (Calculated) : 73  Vital Signs: Temp: 97.6 F (36.4 C) (08/28 0741) Temp Source: Axillary (08/28 0741) BP: 114/80 (08/28 0800) Pulse Rate: 77 (08/28 0800)  Labs: Recent Labs    02/21/18 0446  02/22/18 0555 02/22/18 1801 02/23/18 0348  HGB 14.3  --  16.2  --  15.0  HCT 45.8  --  50.8  --  47.0  PLT 427*  --  447*  --  351  APTT 86*  --  46*  --   --   HEPARINUNFRC 1.52*   < > 0.81* 0.65 0.42  CREATININE 1.93*  --  2.42*  --  1.94*   < > = values in this interval not displayed.   Assessment: 77 yo M admitted for PNA, on apixaban PTA for h/o afib and PE. Last dose of apixaban on 8/18 before switching to IV heparin. Heparin dose was based off aPTT for 8 days prior to switching to HL dosing. HL had been consistently elevated, but dose was adjusted accordingly until within goal. There was concern for hematuria while receiving heparin (pink tinge + dried blood around area), but this is thought to be due to trauma as he may have pulled on the line. A clot was noted to be in the urine overnight, but no clots noted this morning and urine is clear. Currently receiving heparin 1100 units/hr with HL of 0.42.   Patient is now more alert and oriented and will be transferred to step down unit today. Patient also tolerating dysphagia 2 diet with thin liquids and PO medications. Will switch IV heparin to apixaban. He had been taking apixaban 5mg  BID PTA, which is still the appropriate dose despite elevated SCr given age <80 and weight > 60kg. CBC WNL.   Plan:  Discontinue IV heparin. Initiate apixaban 5 mg PO BID at the same time heparin is stopped. Monitor s/sx of bleeding  Thank you Richardine Service, PharmD Candidate 02/23/18  8:43 AM

## 2018-02-23 NOTE — Progress Notes (Signed)
Wayne Booth  ZLD:[id number] DOB: 02-May-1941 DOA: 02/02/2018 PCP: Marin Olp, MD    Reason for Consult / Chief Complaint:  Respiratory Extremis  Consulting MD:  Dana Allan, MD  HPI/Brief Narrative   77 year old with a history of atrial fibrillation and diastolic dysfunction who [id number] right-sided pleuritic chest pain, fever, dyspnea secondary to pneumonia, positive for MRSA bacteremia. He was treated with an empiric combination of azithromycin and cefepime and at the same time was diuresed for diastolic failure. Required intubation 8/17. Patchy infiltrates on the CT scan of the chest obtained on 8/18 1 of them up appears consistent with a peripheral nodule. He has a history of both prostate cancer and renal cancer and reported both of these have undergone curative treatment  Subjective  Pt advanced to po diet yesterday, tolerating well. Tmax o/n 100.1. He denies nausea, pain, SOB. He is 100% on 2.5 L O2.    Assessment & Plan:  Acute hypoxic Resp Failure secondary to HCAP- MRSA PNA with Hx OSA Intubated 8/17, Extubated 8/26. Passed SLP eval. No fever overnight. Leukocytosis stable.  Plan: [place] Transfer to step-down PT consulted IS, Flutter valve Fomitidine per tube See ID section below Pulmicort BID, duonebs prn q4   Fever & Leukocytosis Pt with Tmax 101.5 yesterday. No fever overnight. No SOB. Pan cultures 8/23 negative.  Plan Ceftaroline per ID, day 5/5?  8/27 urine, blood cultures pending  Cooling blanket for fever  Trend WBC/ fever curve   Diastolic HF Septic +/- cardiogenic off pressors since 8/19 Hx Afib on prior eliquis, CAD, HTN: rate & rhythm controlled during admission  TTE 8/9 LVEF 50-55%; technically difficult study; no clear wall motion abnormalities amio held due to transaminitis  Holding cardizem and lopressor ( SR maintaining 58-70's while on precedex) Heparin drip  AKI on CKD; hx of solitary kidney:Cr 2.42  <<2.14 Trend BMP/ mag/ phos / urinary output/ daily wts Replace electrolytes as indicated - hypokalemic - replete K  Constipation resolved, s/p BM 8/23 Increasing Transaminitis:improving with amiodarone held Plan:  Hold amio Colace BID Dulcolax PRN Trend LFTs  Chronically on Eliquis(Estimated Creatinine Clearance: 43.6 mL/min (A) (by C-G formula based on SCr of 1.93 mg/dL (H)).)  LastLabs         Recent Labs  Lab 02/17/18 0330 02/18/18 0416 02/19/18 0503 02/20/18 0508 02/21/18 0446  CREATININE 1.49* 1.83* 1.65* 2.14* 1.93*     Heparin per pharmacy Mild hematuria, monitoring Hold eliquis for now  Hx of transsphenoidal resection and thyroid replacement Plan:  Continue synthroid daily Monitor BS daily   Delirium: resolved  Continue home pamelor 75mg  qd and lexapro 20mg  qd Holding home xanax, trazodone, sovorexant  Gout Hold allopurinil Holding colchicine empirically, as sCr is still elevated   LKT:GYBW    Best Practice / Goals of Care / Disposition.   DVT prophylaxis: heparin, SCDs GI prophylaxis: famotidine Diet: dysphagia 2 Mobility: PT consulted Code Status: DNR Family Communication: No family at bedside  Disposition / Summary of Today's Plan 02/23/18    Patient feeling well, no acute concerns, no fever overnight. Monitor cultures and transfer to step-down.   Consults: ID   LINES/TUBES: ETT 8/17>>> 8/26 R PICC 8/17>>>  CULTURES: 8/7 BC x 2 >> neg 8/8 MRSA PCR >> negatve 8/8 sputum cx >> neg 8/17 MRSA PCR >>positive  8/17 trach asp cx >> few MRSA 8/17 BCx2 >> no growth 8/18 UC >> neg 8/20 trach asp >> MRSA 8/23 trach asp>> no growth 8/23 BCx2 >>  no growth 8/23 UA >> no growth  8/27 BCx2 >> 8/27 UCulturex2 >>   Antimicrobials:  8/7 vanc >> 8/8; 8/17 8/7 cefepime >> 8/15 8/11 azithromycin >> 8/17 >> 8/21 8/17 zosyn >> 8/20; 8/23 >> Rocephin 8/20>>>8/21 8/24 Ceftaroline >>  Objective     Examination: General: NAD, sitting up in bed HEENT: [place] in place, [place]/AT Lungs: clear to auscultation, non-labored breathing, no wheezing/ rales, rhonchi Cardiovascular: RRR, no m/r/g, +1 pitting edema Abdomen: +bs, soft, NTTP, non-distended Extremities: b/l strength  Neuro: A&Ox3, pleasant, normal affect  Blood pressure 126/80, pulse 83, temperature 97.6 F (36.4 C), temperature source Oral, resp. rate 20, height 5\' 10"  (1.778 m), weight 122.5 kg, SpO2 99 %. CVP:  [1 mmHg-4 mmHg] 1 mmHg      Intake/Output Summary (Last 24 hours) at 02/23/2018 0631 Last data filed at 02/23/2018 0600 Gross per 24 hour  Intake 985.79 ml  Output 710 ml  Net 275.79 ml   Filed Weights   02/21/18 0200 02/22/18 0200 02/23/18 0358  Weight: 123.5 kg 123 kg 122.5 kg     Labs   CBC: Recent Labs  Lab 02/19/18 0503 02/20/18 0508 02/21/18 0446 02/22/18 0555 02/23/18 0348  WBC 25.3* 22.3* 21.4* 23.6* 21.0*  NEUTROABS 21.2*  --   --  17.3* 14.8*  HGB 13.1 14.5 14.3 16.2 15.0  HCT 42.4 47.6 45.8 50.8 47.0  MCV 93.0 94.1 93.5 92.2 92.5  PLT 478* 465* 427* 447* 277   Basic Metabolic Panel: Recent Labs  Lab 02/18/18 0416 02/19/18 0503 02/20/18 0508 02/21/18 0446 02/22/18 0555 02/23/18 0348  NA 141 142 142 142 146* 141  K 3.9 3.7 3.6 3.5 3.8 3.2*  CL 101 106 102 103 103 101  CO2 32 28 30 30 30 29   GLUCOSE 120* 138* 181* 157* 126* 126*  BUN 78* 67* 72* 79* 73* 67*  CREATININE 1.83* 1.65* 2.14* 1.93* 2.42* 1.94*  CALCIUM 8.5* 8.1* 8.9 8.7* 9.7 8.9  MG 2.4 2.3 2.2 2.4 2.2 2.4  PHOS 2.9  --  4.8* 4.1 4.3 5.4*   GFR: Estimated Creatinine Clearance: 43.2 mL/min (A) (by C-G formula based on SCr of 1.94 mg/dL (H)). Recent Labs  Lab 02/17/18 0330 02/18/18 0416 02/19/18 0503 02/20/18 0508 02/21/18 0446 02/22/18 0555 02/23/18 0348  PROCALCITON 0.17 0.18 0.18  --   --   --   --   WBC 20.7* 24.0* 25.3* 22.3* 21.4* 23.6* 21.0*   Liver Function Tests: Recent Labs  Lab 02/19/18 1232  02/20/18 0508 02/21/18 0446 02/22/18 0555 02/23/18 0348  AST 157* 161* 121* 75* 46*  ALT 249* 312* 290* 233* 155*  ALKPHOS 94 120 125 133* 107  BILITOT 1.6* 1.6* 1.3* 1.8* 1.3*  PROT 6.5 7.1 6.8 7.6 6.8  ALBUMIN 2.2* 2.6* 2.5* 2.8* 2.6*   No results for input(s): LIPASE, AMYLASE in the last 168 hours. No results for input(s): AMMONIA in the last 168 hours. ABG    Component Value Date/Time   PHART 7.441 02/22/2018 0744   PCO2ART 44.1 02/22/2018 0744   PO2ART 81.0 (L) 02/22/2018 0744   HCO3 29.5 (H) 02/22/2018 0744   TCO2 31 02/22/2018 0744   ACIDBASEDEF 4.0 (H) 02/13/2018 0337   O2SAT 95.0 02/22/2018 0744    Coagulation Profile: No results for input(s): INR, PROTIME in the last 168 hours. Cardiac Enzymes: No results for input(s): CKTOTAL, CKMB, CKMBINDEX, TROPONINI in the last 168 hours. HbA1C: Hgb A1c MFr Bld  Date/Time Value Ref Range Status  12/16/2017 04:27 PM  6.3 4.6 - 6.5 % Final    Comment:    Glycemic Control Guidelines for People with Diabetes:Non Diabetic:  <6%Goal of Therapy: <7%Additional Action Suggested:  >8%   04/12/2017 08:33 AM 6.1 4.6 - 6.5 % Final    Comment:    Glycemic Control Guidelines for People with Diabetes:Non Diabetic:  <6%Goal of Therapy: <7%Additional Action Suggested:  >8%    CBG: Recent Labs  Lab 02/22/18 0338 02/22/18 0734 02/22/18 1103 02/22/18 1542 02/22/18 2025  GLUCAP 121* 105* 125* 104* 110*     Review of Systems:   See subjective above    Past medical history   Past Medical History:  Diagnosis Date  . Allergy    States his nose runs when he is around grease.   Marland Kitchen Anxiety   . Arthritis   . Atrial fibrillation ([place])   . Cancer ([place])   . Coronary artery disease   . History of total knee replacement   . Hypertension   . Hypothyroidism   . Kidney cancer, primary, with metastasis from kidney to other site [facility])   . Kidney stone   . OSA (obstructive sleep apnea)    pt does not wear cpap at night  . Pituitary cyst  ([place])   . Pneumonia    hx of  . Prostate cancer [facility])    Social History   Social History   Socioeconomic History  . Marital status: Married    Spouse name: [name]  . Number of children: Y  . Years of education: Not on file  . Highest education level: Not on file  Occupational History  . Occupation: retired Armed forces operational officer: RETIRED  Social Needs  . Financial resource strain: Not on file  . Food insecurity:    Worry: Not on file    Inability: Not on file  . Transportation needs:    Medical: Not on file    Non-medical: Not on file  Tobacco Use  . Smoking status: Former Smoker    Years: 4.00    Types: Cigars  . Smokeless tobacco: Never Used  . Tobacco comment: QUIT 2 YEARS AGO   2017  Substance and Sexual Activity  . Alcohol use: Yes    Alcohol/week: 2.0 standard drinks    Types: 2 Shots of liquor per week    Comment: socially  . Drug use: No  . Sexual activity: Not on file  Lifestyle  . Physical activity:    Days per week: Not on file    Minutes per session: Not on file  . Stress: Not on file  Relationships  . Social connections:    Talks on phone: Not on file    Gets together: Not on file    Attends religious service: Not on file    Active member of club or organization: Not on file    Attends meetings of clubs or organizations: Not on file    Relationship status: Not on file  . Intimate partner violence:    Fear of current or ex partner: Not on file    Emotionally abused: Not on file    Physically abused: Not on file    Forced sexual activity: Not on file  Other Topics Concern  . Not on file  Social History Narrative   Married 1965 (wife patient of Dr. Megan Salon). 2 sons. 4 granddaughters.       Retired Chemical engineer.       Hobbies: golf, fishing, formerly hunting.  Family history    Family History  Problem Relation Age of Onset  . Cancer Father        ?mouth, died before patient born  . Cancer Mother         stomach, died when he was young  . Kidney cancer Sister   . Colon cancer Brother 18  . Esophageal cancer Neg Hx   . Rectal cancer Neg Hx   . Stomach cancer Neg Hx     Allergies Allergies  Allergen Reactions  . Ambien [Zolpidem] Anxiety    Anxiety and agitation when tried as sleeper in hospital aug 2019    Home meds  Prior to Admission medications   Medication Sig Start Date End Date Taking? Authorizing Provider  albuterol (PROVENTIL HFA;VENTOLIN HFA) 108 (90 Base) MCG/ACT inhaler Inhale 2 puffs into the lungs every 6 (six) hours as needed for wheezing or shortness of breath. 08/11/17  Yes Rigoberto Noel, MD  allopurinol (ZYLOPRIM) 300 MG tablet Take 0.5 tablets (150 mg total) by mouth daily. 12/07/17  Yes Rai, Ripudeep K, MD  amiodarone (PACERONE) 200 MG tablet Take 1 tablet (200 mg total) by mouth daily. 01/28/18  Yes Camnitz, Ocie Doyne, MD  apixaban (ELIQUIS) 5 MG TABS tablet Take 1 tablet (5 mg total) by mouth 2 (two) times daily. 11/03/17  Yes Georgette Shell, MD  budesonide-formoterol [facility]) 160-4.5 MCG/ACT inhaler Inhale 2 puffs into the lungs 2 (two) times daily as needed (for respiratory flares).   Yes [provider]  clotrimazole-betamethasone (LOTRISONE) cream Apply 1 application topically 2 (two) times daily. Use for 10 days maximum. Patient taking differently: Apply 1 application topically 2 (two) times daily as needed (for rashes).  08/27/16  Yes Marin Olp, MD  colchicine 0.6 MG tablet Take 1 tablet (0.6 mg total) by mouth daily. 12/07/17  Yes Rai, Ripudeep K, MD  cyclobenzaprine (FLEXERIL) 10 MG tablet Take 10 mg by mouth daily as needed for muscle spasms.  09/30/17  Yes [provider]  diltiazem (CARDIZEM CD) 240 MG 24 hr capsule Take 1 capsule (240 mg total) by mouth daily. 12/07/17  Yes Rai, Ripudeep K, MD  escitalopram (LEXAPRO) 20 MG tablet TAKE ONE TABLET BY MOUTH DAILY 01/27/18  Yes Marin Olp, MD  furosemide (LASIX) 20 MG tablet Take 1  tablet (20 mg total) by mouth daily. 12/07/17  Yes Rai, Ripudeep K, MD  levothyroxine (SYNTHROID, LEVOTHROID) 150 MCG tablet TAKE ONE TABLET BY MOUTH DAILY 07/06/17  Yes Marin Olp, MD  metoprolol tartrate (LOPRESSOR) 25 MG tablet Take 0.5 tablets (12.5 mg total) by mouth 2 (two) times daily. 12/07/17  Yes Rai, Vernelle Emerald, MD  Multiple Vitamins-Minerals (MULTIVITAMINS THER. W/MINERALS) TABS Take 1 tablet by mouth daily.   Yes [provider]  nitroGLYCERIN (NITROSTAT) 0.4 MG SL tablet Place 1 tablet (0.4 mg total) under the tongue every 5 (five) minutes as needed for chest pain. 12/07/17  Yes Rai, Ripudeep K, MD  nortriptyline (PAMELOR) 75 MG capsule TAKE ONE CAPSULE BY MOUTH AT BEDTIME 12/31/17  Yes Marin Olp, MD  Suvorexant (BELSOMRA) 10 MG TABS Take 10 mg by mouth at bedtime. 12/27/17  Yes Marin Olp, MD     LOS: 21 days

## 2018-02-23 NOTE — Progress Notes (Signed)
  Speech Language Pathology Treatment: Dysphagia  Patient Details Name: Wayne Booth MRN: [id number] DOB: 06/16/1941 Today's Date: 02/23/2018 Time: 4403-4742 SLP Time Calculation (min) (ACUTE ONLY): 15 min  Assessment / Plan / Recommendation Clinical Impression  Pt self-fed lunch meal with Mod A provided for appropriate bolus size. He was only eating the pureed textures on his tray, which he says is because of taste preference, although his wife also says he has been taking a long time to eat. No overt signs of aspiration were observed, although he had mild shortness of breath that was present throughout observation. He believes his vocal quality is close to baseline, although "a little softer." Recommend continuing current diet for now, but with good prognosis for advancement as overall endurance improves.   HPI HPI: Pt is a 77 y.o. male with hx of atrial fibrillation status post recent ablation, OSA, hypothyroidism, PE, hypertension, both renal and prostate cancer, and CAD. Presented to ED with SOB and right side pleuritic chest pain. Pt was one way extubated 02/21/18 (initially intubated 02/12/18), remains on feeding tube. CXR interval extubation of the trachea and esophagus. Slight interval improvement in bibasilar atelectasis or pneumonia. Slightly decreased pulmonary vascular congestion.      SLP Plan  Continue with current plan of care       Recommendations  Diet recommendations: Dysphagia 2 (fine chop);Thin liquid Liquids provided via: Cup;Straw Medication Administration: Whole meds with puree Supervision: Staff to assist with self feeding;Full supervision/cueing for compensatory strategies Compensations: Slow rate;Small sips/bites;Lingual sweep for clearance of pocketing Postural Changes and/or Swallow Maneuvers: Seated upright 90 degrees                Oral Care Recommendations: Oral care BID Follow up Recommendations: None SLP Visit Diagnosis: Dysphagia, oropharyngeal  phase (R13.12) Plan: Continue with current plan of care       GO                Wayne Booth 02/23/2018, 3:06 PM  Wayne Booth, M.A. [place] Acute Environmental education officer [phone] Office [phone]

## 2018-02-23 NOTE — Progress Notes (Signed)
Physical Therapy Treatment Patient Details Name: Wayne Booth MRN: [id number] DOB: May 15, 1941 Today's Date: 02/23/2018    History of Present Illness 77 y.o. male with morbid obesity, atrial fibrillation s/p recent ablation, OSA noncompliant with CPAP, hypothyroidism, prior PE, renal and prostate cancer s/p nephrectomy here with multilobar pneumonia and acute on chronic diastolic heart failure. Transfered to ICU and intubated 8/17-8/22 when he was extubated, failed BiPAP use and was reintubated.  Extubated on 8/26. PT reordered.    PT Comments    Pt eager to get out of bed this morning. Pt continues to be limited in safe mobility by decreased strength and balance. Pt requires maxAx1 for supine to sit with HoB elevated and maxAx2 for sit<>stand utilizing Stedy to transfer to recliner. Pt much more awake and alert throughout session and very grateful to be out of the bed. PT will continue to follow to progress mobility with ultimate d/c to SNF to rehab before returning home.     Follow Up Recommendations  SNF;Supervision/Assistance - 24 hour     Equipment Recommendations  Other (comment)(TBD at next venue)    Recommendations for Other Services       Precautions / Restrictions Precautions Precautions: Fall Precaution Comments: O2 dependent; watch SpO2 Restrictions Weight Bearing Restrictions: No    Mobility  Bed Mobility Overal bed mobility: Needs Assistance Bed Mobility: Supine to Sit;Sit to Supine     Supine to sit: Max assist;HOB elevated     General bed mobility comments: max A for trunk to upright and pad scoot of hips to EoB  Transfers Overall transfer level: Needs assistance   Transfers: Sit to/from Stand Sit to Stand: Max assist;+2 physical assistance         General transfer comment: maxAx2 for powerup into standing for Stedy pad placement. decreased MaxAx2 needed for powerup from Stedy pads        Balance Overall balance assessment: Needs  assistance Sitting-balance support: Bilateral upper extremity supported;Feet supported Sitting balance-Leahy Scale: Poor Sitting balance - Comments: once feet on floor and given time to acclimate to sitting able to sit with min guard for balance                                    Cognition Arousal/Alertness: Lethargic Behavior During Therapy: Flat affect Overall Cognitive Status: Impaired/Different from baseline Area of Impairment: Problem solving;Safety/judgement                         Safety/Judgement: Decreased awareness of safety;Decreased awareness of deficits   Problem Solving: Slow processing;Decreased initiation;Difficulty sequencing;Requires verbal cues;Requires tactile cues General Comments: overall better processing and command following today         General Comments General comments (skin integrity, edema, etc.): Pt on 1L O2 via nasal cannula SaO2 throughout session >93%O2, VSS       Pertinent Vitals/Pain Faces Pain Scale: Hurts even more Pain Location: generalized Pain Descriptors / Indicators: Grimacing;Guarding;Sore           PT Goals (current goals can now be found in the care plan section) Acute Rehab PT Goals Patient Stated Goal: to return to independent PT Goal Formulation: With patient/family Time For Goal Achievement: 03/08/18 Potential to Achieve Goals: Fair Progress towards PT goals: Progressing toward goals    Frequency    Min 3X/week      PT Plan Current plan remains appropriate  AM-PAC PT "6 Clicks" Daily Activity  Outcome Measure  Difficulty turning over in bed (including adjusting bedclothes, sheets and blankets)?: Unable Difficulty moving from lying on back to sitting on the side of the bed? : Unable Difficulty sitting down on and standing up from a chair with arms (e.g., wheelchair, bedside commode, etc,.)?: Unable Help needed moving to and from a bed to chair (including a wheelchair)?: Total Help  needed walking in hospital room?: Total Help needed climbing 3-5 steps with a railing? : Total 6 Click Score: 6    End of Session Equipment Utilized During Treatment: Oxygen Activity Tolerance: Patient limited by fatigue;Patient limited by lethargy Patient left: in bed;with call bell/phone within reach;with family/visitor present Nurse Communication: Mobility status PT Visit Diagnosis: Other abnormalities of gait and mobility (R26.89);Muscle weakness (generalized) (M62.81)     Time: 0867-6195 PT Time Calculation (min) (ACUTE ONLY): 45 min  Charges:  $Therapeutic Activity: 38-52 mins                     Delquan Poucher B. Migdalia Dk PT, DPT Acute Rehabilitation  [phone] Pager [phone]     [place] 02/23/2018, 10:45 AM

## 2018-02-23 NOTE — Progress Notes (Signed)
Patient ID: Wayne Booth, male   DOB: April 12, 1941, 77 y.o.   MRN: [id number]         [facility] for Infectious Disease  Date of Admission:  02/02/2018    Total days of antibiotics 14        Day 5 ceftaroline         ASSESSMENT: He was afebrile overnight.  His urine culture is negative and blood cultures are negative so far.  He is now completed 2 weeks of antibiotic therapy.  I favor stopping ceftaroline now.  PLAN: 1. Discontinue ceftaroline  2. Await results of blood cultures  Active Problems:   History of renal cell carcinoma   Essential hypertension   OSA (obstructive sleep apnea)   Hypothyroidism   History of pulmonary embolism   Paroxysmal atrial fibrillation (HCC)   Acute respiratory failure with hypoxia (HCC)   HCAP (healthcare-associated pneumonia)   Pericardial effusion   SOB (shortness of breath)   Elevated troponin   Acute on chronic systolic and diastolic heart failure, NYHA class 2 (HCC)   Dyspnea   Pneumonia of right middle lobe due to infectious organism ([place])   Pneumonia due to methicillin resistant Staphylococcus aureus (MRSA) ([place])   Scheduled Meds: . apixaban  5 mg Oral BID  . bacitracin-polymyxin b   Left Eye QHS  . budesonide (PULMICORT) nebulizer solution  0.5 mg Nebulization BID  . chlorhexidine  15 mL Mouth Rinse BID  . Chlorhexidine Gluconate Cloth  6 each Topical Daily  . docusate sodium  100 mg Oral Daily  . escitalopram  20 mg Oral Daily  . [START ON 02/24/2018] levothyroxine  150 mcg Oral QAC breakfast  . mouth rinse  15 mL Mouth Rinse q12n4p  . multivitamin with minerals  1 tablet Oral Daily  . nortriptyline  75 mg Oral QHS  . potassium chloride  20 mEq Oral TID  . senna  1 tablet Oral Daily  . sodium chloride flush  10-40 mL Intracatheter Q12H   Continuous Infusions: . sodium chloride Stopped (02/23/18 0931)   PRN Meds:.albuterol, ipratropium-albuterol, polyvinyl alcohol, sodium chloride flush   SUBJECTIVE: He is feeling  better today.    Review of Systems: Review of Systems  Constitutional: Negative for chills and fever.  Respiratory: Positive for cough. Negative for sputum production and shortness of breath.   Gastrointestinal: Negative for diarrhea.    Allergies  Allergen Reactions  . Ambien [Zolpidem] Anxiety    Anxiety and agitation when tried as sleeper in hospital aug 2019    OBJECTIVE: Vitals:   02/23/18 0800 02/23/18 0900 02/23/18 1000 02/23/18 1100  BP: 114/80 (!) 148/92 113/71   Pulse: 77 93 82   Resp: (!) 22 17 (!) 30 19  Temp:      TempSrc:      SpO2: 100% 100% 97%   Weight:      Height:       Body mass index is 38.75 kg/m.  Physical Exam  Constitutional: He is oriented to person, place, and time.  He is alert and sitting up in a chair.  He is joking with his nurses.  Genitourinary:  Genitourinary Comments: His Foley catheter has been removed.  Neurological: He is alert and oriented to person, place, and time.  Psychiatric: He has a normal mood and affect.    Lab Results Lab Results  Component Value Date   WBC 21.0 (H) 02/23/2018   HGB 15.0 02/23/2018   HCT 47.0 02/23/2018   MCV 92.5  02/23/2018   PLT 351 02/23/2018    Lab Results  Component Value Date   CREATININE 1.94 (H) 02/23/2018   BUN 67 (H) 02/23/2018   NA 141 02/23/2018   K 3.2 (L) 02/23/2018   CL 101 02/23/2018   CO2 29 02/23/2018    Lab Results  Component Value Date   ALT 155 (H) 02/23/2018   AST 46 (H) 02/23/2018   ALKPHOS 107 02/23/2018   BILITOT 1.3 (H) 02/23/2018     Microbiology: Recent Results (from the past 240 hour(s))  Culture, respiratory (non-expectorated)     Status: None   Collection Time: 02/15/18  4:01 AM  Result Value Ref Range Status   Specimen Description TRACHEAL ASPIRATE  Final   Special Requests NONE  Final   Gram Stain   Final    ABUNDANT WBC PRESENT,BOTH PMN AND MONONUCLEAR ABUNDANT GRAM POSITIVE COCCI FEW YEAST Performed at [facility], [place] [address] [place], [place] [address]    Culture   Final    ABUNDANT METHICILLIN RESISTANT STAPHYLOCOCCUS AUREUS   Report Status 02/17/2018 FINAL  Final   Organism ID, Bacteria METHICILLIN RESISTANT STAPHYLOCOCCUS AUREUS  Final      Susceptibility   Methicillin resistant staphylococcus aureus - MIC*    CIPROFLOXACIN >=8 RESISTANT Resistant     ERYTHROMYCIN >=8 RESISTANT Resistant     GENTAMICIN <=0.5 SENSITIVE Sensitive     OXACILLIN >=4 RESISTANT Resistant     TETRACYCLINE <=1 SENSITIVE Sensitive     VANCOMYCIN <=0.5 SENSITIVE Sensitive     TRIMETH/SULFA <=10 SENSITIVE Sensitive     CLINDAMYCIN <=0.25 SENSITIVE Sensitive     RIFAMPIN <=0.5 SENSITIVE Sensitive     Inducible Clindamycin NEGATIVE Sensitive     * ABUNDANT METHICILLIN RESISTANT STAPHYLOCOCCUS AUREUS  Culture, blood (routine x 2)     Status: None   Collection Time: 02/18/18 10:38 AM  Result Value Ref Range Status   Specimen Description BLOOD LEFT ANTECUBITAL  Final   Special Requests   Final    BOTTLES DRAWN AEROBIC AND ANAEROBIC Blood Culture adequate volume   Culture   Final    NO GROWTH 5 DAYS Performed at [facility], [address], [place], [place] [address]    Report Status 02/23/2018 FINAL  Final  Culture, blood (routine x 2)     Status: None   Collection Time: 02/18/18 10:38 AM  Result Value Ref Range Status   Specimen Description BLOOD LEFT HAND  Final   Special Requests   Final    BOTTLES DRAWN AEROBIC ONLY Blood Culture results may not be optimal due to an inadequate volume of blood received in culture bottles   Culture   Final    NO GROWTH 5 DAYS Performed at [facility], [place] [address], [place], [place] [address]    Report Status 02/23/2018 FINAL  Final  Culture, respiratory (non-expectorated)     Status: None   Collection Time: 02/18/18 12:00 PM  Result Value Ref Range Status   Specimen Description TRACHEAL ASPIRATE  Final   Special Requests NONE  Final   Gram Stain   Final    RARE WBC  PRESENT,BOTH PMN AND MONONUCLEAR NO ORGANISMS SEEN Performed at [facility], [place] [address] [place], [place] [address]    Culture FEW CANDIDA ALBICANS  Final   Report Status 02/20/2018 FINAL  Final  Culture, Urine     Status: None   Collection Time: 02/22/18 12:58 PM  Result Value  Ref Range Status   Specimen Description URINE, CATHETERIZED  Final   Special Requests NONE  Final   Culture   Final    NO GROWTH Performed at [facility], [address], [place], [place] [address]    Report Status 02/23/2018 FINAL  Final  Culture, blood (routine x 2)     Status: None (Preliminary result)   Collection Time: 02/22/18  1:23 PM  Result Value Ref Range Status   Specimen Description BLOOD BLOOD LEFT HAND  Final   Special Requests   Final    BOTTLES DRAWN AEROBIC ONLY Blood Culture adequate volume   Culture   Final    NO GROWTH < 24 HOURS Performed at [facility], [place] [address] [place], [place] [address]    Report Status PENDING  Incomplete  Culture, blood (single)     Status: None (Preliminary result)   Collection Time: 02/22/18  4:43 PM  Result Value Ref Range Status   Specimen Description BLOOD LEFT HAND  Final   Special Requests   Final    BOTTLES DRAWN AEROBIC ONLY Blood Culture results may not be optimal due to an inadequate volume of blood received in culture bottles   Culture   Final    NO GROWTH < 24 HOURS Performed at [facility], [place] [address] [place], [place] [address]    Report Status PENDING  Incomplete    Michel Bickers, MD [facility] [phone] pager   [phone] cell 02/23/2018, 12:34 PM

## 2018-02-23 NOTE — Progress Notes (Signed)
SLP Cancellation Note  Patient Details Name: TOPHER BUENAVENTURA MRN: [id number] DOB: 26-May-1941   Cancelled treatment:       Reason Eval/Treat Not Completed: Other (comment) Pt working with nursing - will f/u as able.   [name] 02/23/2018, 10:51 AM  [name], M.A. CCC-SLP [phone]

## 2018-02-23 NOTE — Progress Notes (Signed)
Report called to 2w01 and pt transferred via bed with family at bedside

## 2018-02-24 DIAGNOSIS — E890 Postprocedural hypothyroidism: Secondary | ICD-10-CM

## 2018-02-24 LAB — COMPREHENSIVE METABOLIC PANEL
ALBUMIN: 2.6 g/dL — AB (ref 3.5–5.0)
ALT: 119 U/L — ABNORMAL HIGH (ref 0–44)
ANION GAP: 13 (ref 5–15)
AST: 61 U/L — ABNORMAL HIGH (ref 15–41)
Alkaline Phosphatase: 93 U/L (ref 38–126)
BILIRUBIN TOTAL: 1 mg/dL (ref 0.3–1.2)
BUN: 53 mg/dL — ABNORMAL HIGH (ref 8–23)
CHLORIDE: 103 mmol/L (ref 98–111)
CO2: 23 mmol/L (ref 22–32)
Calcium: 8.9 mg/dL (ref 8.9–10.3)
Creatinine, Ser: 1.71 mg/dL — ABNORMAL HIGH (ref 0.61–1.24)
GFR calc Af Amer: 43 mL/min — ABNORMAL LOW (ref >60)
GFR calc non Af Amer: 37 mL/min — ABNORMAL LOW (ref >60)
GLUCOSE: 81 mg/dL (ref 70–99)
Potassium: 4 mmol/L (ref 3.5–5.1)
Sodium: 139 mmol/L (ref 135–145)
TOTAL PROTEIN: 6.7 g/dL (ref 6.5–8.1)

## 2018-02-24 LAB — GLUCOSE, CAPILLARY: GLUCOSE-CAPILLARY: 98 mg/dL (ref 70–99)

## 2018-02-24 LAB — CBC
HEMATOCRIT: 46.9 % (ref 39.0–52.0)
Hemoglobin: 15.2 g/dL (ref 13.0–17.0)
MCH: 29.6 pg (ref 26.0–34.0)
MCHC: 32.4 g/dL (ref 30.0–36.0)
MCV: 91.2 fL (ref 78.0–100.0)
Platelets: 330 10*3/uL (ref 150–400)
RBC: 5.14 MIL/uL (ref 4.22–5.81)
RDW: 15.4 % (ref 11.5–15.5)
WBC: 20.8 10*3/uL — ABNORMAL HIGH (ref 4.0–10.5)

## 2018-02-24 MED ORDER — ENSURE ENLIVE PO LIQD
237.0000 mL | Freq: Three times a day (TID) | ORAL | Status: DC
  Administered 2018-02-24 – 2018-02-26 (×5): 237 mL via ORAL

## 2018-02-24 NOTE — Consult Note (Signed)
Physical Medicine and Rehabilitation Consult Reason for Consult: Decreased functional mobility Referring Physician: [facility]   HPI: Wayne Booth is a 77 y.o. right-handed male with history of atrial fibrillation status post ablation maintained on Eliquis, sleep apnea, CKD stage III, history of PE, hypertension.  Per chart review patient lives with spouse independent prior to admission with occasional use of a cane.  Two-level home with bedroom on [address].  Presented 02/03/2018 with increasing shortness of breath and decreased mobility over a 5-day.  As well as associated right side pleuritic chest pain.  Chest x-ray showed right lung mid lung zone consolidation concerning for pneumonia.  Perfusion scan low probability for pulmonary embolism.  CT of the chest showing bilateral multilobar pneumonia involving the right upper, middle and both lower lobes.  Mild hyponatremia 131, troponin 0.12, lactic acid 1.51.  Placed on broad-spectrum antibiotics for suspect sepsis.  Echocardiogram with ejection fraction of 55% no wall motion abnormalities.  Patient did require intubation for acute respiratory failure 02/12/2018 until 02/17/2018.  Persistent leukocytosis 23,600 infectious disease consulted with antibiotics since discontinued advised to continue to monitor.  MRSA PCR screening positive with contact precautions.  Occupational therapy evaluation completed 02/24/2018 with recommendations of physical medicine rehab consult.  Patient no longer requiring O2.  He denies any shortness of breath.  He does have complaints of poor endurance even when getting up from bed to chair  Review of Systems  Constitutional: Negative for chills and fever.  HENT: Negative for hearing loss.   Eyes: Negative for blurred vision and double vision.  Respiratory: Positive for cough and shortness of breath.   Cardiovascular: Positive for palpitations and leg swelling.  Gastrointestinal: Positive for constipation. Negative for  nausea.  Genitourinary: Positive for urgency. Negative for dysuria, flank pain and hematuria.  Musculoskeletal: Positive for joint pain and myalgias.  Skin: Negative for rash.  All other systems reviewed and are negative.  Past Medical History:  Diagnosis Date  . Allergy    States his nose runs when he is around grease.   Marland Kitchen Anxiety   . Arthritis   . Atrial fibrillation ([place])   . Cancer ([place])   . Coronary artery disease   . History of total knee replacement   . Hypertension   . Hypothyroidism   . Kidney cancer, primary, with metastasis from kidney to other site [facility])   . Kidney stone   . OSA (obstructive sleep apnea)    pt does not wear cpap at night  . Pituitary cyst ([place])   . Pneumonia    hx of  . Prostate cancer [facility])    Past Surgical History:  Procedure Laterality Date  . ABLATION OF DYSRHYTHMIC FOCUS  01/28/2018  . ANTERIOR CERVICAL DECOMP/DISCECTOMY FUSION N/A 08/29/2012   Procedure: ANTERIOR CERVICAL DECOMPRESSION/DISCECTOMY FUSION 1 LEVEL;  Surgeon: Eustace Moore, MD;  Location: [facility];  Service: Neurosurgery;  Laterality: N/A;  . APPENDECTOMY    . ATRIAL FIBRILLATION ABLATION N/A 01/28/2018   Procedure: ATRIAL FIBRILLATION ABLATION;  Surgeon: Constance Haw, MD;  Location: [facility] CV LAB;  Service: Cardiovascular;  Laterality: N/A;  . CARDIAC CATHETERIZATION  2008   clean  . CHOLECYSTECTOMY    . COLONOSCOPY W/ POLYPECTOMY    . CRANIOTOMY N/A 11/08/2012   Procedure: CRANIOTOMY HYPOPHYSECTOMY TRANSNASAL APPROACH;  Surgeon: Faythe Ghee, MD;  Location: [facility];  Service: Neurosurgery;  Laterality: N/A;  Transphenoidal Resection of Pituitary Tumor  . FINGER SURGERY Left 2017  Dr Amedeo Plenty  . INSERTION PROSTATE RADIATION SEED    . KIDNEY STONE SURGERY    . KNEE ARTHROSCOPY  2007   left  . NEPHRECTOMY Left   . POSTERIOR CERVICAL LAMINECTOMY Left 04/24/2015   Procedure: Foraminotomy cervical five - cervical six cervical six - cervical seven left;   Surgeon: Eustace Moore, MD;  Location: [facility];  Service: Neurosurgery;  Laterality: Left;  . REPLACEMENT TOTAL KNEE BILATERAL     Family History  Problem Relation Age of Onset  . Cancer Father        ?mouth, died before patient born  . Cancer Mother        stomach, died when he was young  . Kidney cancer Sister   . Colon cancer Brother 49  . Esophageal cancer Neg Hx   . Rectal cancer Neg Hx   . Stomach cancer Neg Hx    Social History:  reports that he has quit smoking. His smoking use included cigars. He quit after 4.00 years of use. He has never used smokeless tobacco. He reports that he drinks about 2.0 standard drinks of alcohol per week. He reports that he does not use drugs. Allergies:  Allergies  Allergen Reactions  . Ambien [Zolpidem] Anxiety    Anxiety and agitation when tried as sleeper in hospital aug 2019   Medications Prior to Admission  Medication Sig Dispense Refill  . albuterol (PROVENTIL HFA;VENTOLIN HFA) 108 (90 Base) MCG/ACT inhaler Inhale 2 puffs into the lungs every 6 (six) hours as needed for wheezing or shortness of breath. 1 Inhaler 5  . allopurinol (ZYLOPRIM) 300 MG tablet Take 0.5 tablets (150 mg total) by mouth daily. 30 tablet 3  . amiodarone (PACERONE) 200 MG tablet Take 1 tablet (200 mg total) by mouth daily. 90 tablet 3  . apixaban (ELIQUIS) 5 MG TABS tablet Take 1 tablet (5 mg total) by mouth 2 (two) times daily. 60 tablet 1  . budesonide-formoterol (SYMBICORT) 160-4.5 MCG/ACT inhaler Inhale 2 puffs into the lungs 2 (two) times daily as needed (for respiratory flares).    . clotrimazole-betamethasone (LOTRISONE) cream Apply 1 application topically 2 (two) times daily. Use for 10 days maximum. (Patient taking differently: Apply 1 application topically 2 (two) times daily as needed (for rashes). ) 45 g 1  . colchicine 0.6 MG tablet Take 1 tablet (0.6 mg total) by mouth daily. 30 tablet 3  . cyclobenzaprine (FLEXERIL) 10 MG tablet Take 10 mg by mouth  daily as needed for muscle spasms.     Marland Kitchen diltiazem (CARDIZEM CD) 240 MG 24 hr capsule Take 1 capsule (240 mg total) by mouth daily. 30 capsule 3  . escitalopram (LEXAPRO) 20 MG tablet TAKE ONE TABLET BY MOUTH DAILY 90 tablet 3  . furosemide (LASIX) 20 MG tablet Take 1 tablet (20 mg total) by mouth daily. 30 tablet 3  . levothyroxine (SYNTHROID, LEVOTHROID) 150 MCG tablet TAKE ONE TABLET BY MOUTH DAILY 90 tablet 2  . metoprolol tartrate (LOPRESSOR) 25 MG tablet Take 0.5 tablets (12.5 mg total) by mouth 2 (two) times daily. 60 tablet 3  . Multiple Vitamins-Minerals (MULTIVITAMINS THER. W/MINERALS) TABS Take 1 tablet by mouth daily.    . nitroGLYCERIN (NITROSTAT) 0.4 MG SL tablet Place 1 tablet (0.4 mg total) under the tongue every 5 (five) minutes as needed for chest pain. 30 tablet 12  . nortriptyline (PAMELOR) 75 MG capsule TAKE ONE CAPSULE BY MOUTH AT BEDTIME 90 capsule 1  . Suvorexant (BELSOMRA) 10 MG  TABS Take 10 mg by mouth at bedtime. 30 tablet 5    Home: Home Living Family/patient expects to be discharged to:: Private residence Living Arrangements: Spouse/significant other Available Help at Discharge: Family, Available 24 hours/day Type of Home: House Home Access: Level entry Home Layout: Two level, Able to live on main level with bedroom/bathroom Bathroom Shower/Tub: Multimedia programmer: Standard Bathroom Accessibility: Yes Home Equipment: Cane - single point  Functional History: Prior Function Level of Independence: Independent Functional Status:  Mobility: Bed Mobility Overal bed mobility: Needs Assistance Bed Mobility: Supine to Sit, Sit to Supine Supine to sit: Max assist, HOB elevated Sit to supine: Total assist, +2 for physical assistance General bed mobility comments: pt up in recliner Transfers Overall transfer level: Needs assistance Equipment used: Rolling walker (2 wheeled) Transfer via Lift Equipment: Stedy Transfers: Sit to/from Stand Sit to  Stand: Max assist, +2 physical assistance General transfer comment: max lifting A and vc for body mechanics Ambulation/Gait Ambulation/Gait assistance: Herbalist (Feet): 40 Feet Assistive device: Rolling walker (2 wheeled) Gait Pattern/deviations: Step-through pattern, Shuffle, Trunk flexed General Gait Details: min A for steadying, pt with decreased foot clearance with progress, vc for upright posture and procimity to RW Gait velocity: slowed Gait velocity interpretation: <1.31 ft/sec, indicative of household ambulator    ADL: ADL Overall ADL's : Needs assistance/impaired Eating/Feeding: Independent Grooming: Wash/dry hands, Sitting, Modified independent Upper Body Bathing: Sitting, Set up Lower Body Bathing: Moderate assistance, Sitting/lateral leans Upper Body Dressing : Set up, Sitting Lower Body Dressing: Maximal assistance, Sit to/from stand Toilet Transfer: Maximal assistance, +2 for physical assistance, +2 for safety/equipment, Stand-pivot, RW, Requires wide/bariatric Toileting- Clothing Manipulation and Hygiene: Total assistance, +2 for physical assistance, +2 for safety/equipment Functional mobility during ADLs: Total assistance, +2 for physical assistance, +2 for safety/equipment, Rolling walker  Cognition: Cognition Overall Cognitive Status: Impaired/Different from baseline Orientation Level: Oriented to person, Oriented to place, Disoriented to time, Disoriented to situation Cognition Arousal/Alertness: Awake/alert Behavior During Therapy: WFL for tasks assessed/performed Overall Cognitive Status: Impaired/Different from baseline Area of Impairment: Problem solving, Safety/judgement Safety/Judgement: Decreased awareness of safety, Decreased awareness of deficits Problem Solving: Slow processing General Comments: Pt AOx4 and despite some slow processing, [facility] for all tasks assessed  Blood pressure 116/72, pulse 88, temperature (!) 96.4 F (35.8 C),  temperature source Axillary, resp. rate 20, height 5\' 10"  (1.778 m), weight 122.5 kg, SpO2 94 %. Physical Exam  Vitals reviewed. Constitutional: He is oriented to person, place, and time.  HENT:  Head: Normocephalic.  Eyes: EOM are normal.  Neck: Normal range of motion. Neck supple. No thyromegaly present.  Cardiovascular:  Cardiac rate controlled  Respiratory: Effort normal and breath sounds normal.  GI: Soft. Bowel sounds are normal. He exhibits no distension.  Neurological: He is alert and oriented to person, place, and time.  Skin: Skin is warm and dry.  Motor strength is 5/5 bilateral deltoid bicep tricep grip hip flexor knee extensor, ankle dorsiflexor plantar flexor Patient becomes winded during manual muscle testing. Sensation reported as equal to light touch bilateral upper and lower limbs  Results for orders placed or performed during the hospital encounter of 02/02/18 (from the past 24 hour(s))  CBC     Status: Abnormal   Collection Time: 02/24/18  3:20 AM  Result Value Ref Range   WBC 20.8 (H) 4.0 - 10.5 K/uL   RBC 5.14 4.22 - 5.81 MIL/uL   Hemoglobin 15.2 13.0 - 17.0 g/dL   HCT 46.9 39.0 -  52.0 %   MCV 91.2 78.0 - 100.0 fL   MCH 29.6 26.0 - 34.0 pg   MCHC 32.4 30.0 - 36.0 g/dL   RDW 15.4 11.5 - 15.5 %   Platelets 330 150 - 400 K/uL  Comprehensive metabolic panel     Status: Abnormal   Collection Time: 02/24/18  3:20 AM  Result Value Ref Range   Sodium 139 135 - 145 mmol/L   Potassium 4.0 3.5 - 5.1 mmol/L   Chloride 103 98 - 111 mmol/L   CO2 23 22 - 32 mmol/L   Glucose, Bld 81 70 - 99 mg/dL   BUN 53 (H) 8 - 23 mg/dL   Creatinine, Ser 1.71 (H) 0.61 - 1.24 mg/dL   Calcium 8.9 8.9 - 10.3 mg/dL   Total Protein 6.7 6.5 - 8.1 g/dL   Albumin 2.6 (L) 3.5 - 5.0 g/dL   AST 61 (H) 15 - 41 U/L   ALT 119 (H) 0 - 44 U/L   Alkaline Phosphatase 93 38 - 126 U/L   Total Bilirubin 1.0 0.3 - 1.2 mg/dL   GFR calc non Af Amer 37 (L) >60 mL/min   GFR calc Af Amer 43 (L) >60  mL/min   Anion gap 13 5 - 15  Glucose, capillary     Status: None   Collection Time: 02/24/18  6:21 AM  Result Value Ref Range   Glucose-Capillary 98 70 - 99 mg/dL   No results found.   Assessment/Plan: Diagnosis: Deconditioning after pneumonia and respiratory failure 1. Does the need for close, 24 hr/day medical supervision in concert with the patient's rehab needs make it unreasonable for this patient to be served in a less intensive setting? Yes 2. Co-Morbidities requiring supervision/potential complications: Morbid obesity, atrial fibrillation, chronic kidney disease stage III, essential hypertension 3. Due to bladder management, bowel management, safety, skin/wound care, disease management, medication administration, pain management and patient education, does the patient require 24 hr/day rehab nursing? Yes 4. Does the patient require coordinated care of a physician, rehab nurse, PT (1-2 hrs/day, 5 days/week) and OT (1-2 hrs/day, 5 days/week) to address physical and functional deficits in the context of the above medical diagnosis(es)? Yes Addressing deficits in the following areas: balance, endurance, locomotion, strength, transferring, bowel/bladder control, bathing, dressing, feeding, grooming, toileting and psychosocial support 5. Can the patient actively participate in an intensive therapy program of at least 3 hrs of therapy per day at least 5 days per week? Yes 6. The potential for patient to make measurable gains while on inpatient rehab is excellent 7. Anticipated functional outcomes upon discharge from inpatient rehab are modified independent and supervision  with PT, modified independent and supervision with OT, n/a with SLP. 8. Estimated rehab length of stay to reach the above functional goals is: 14-18d 9. Anticipated D/C setting: Home 10. Anticipated post D/C treatments: Pettibone therapy 11. Overall Rehab/Functional Prognosis: good  RECOMMENDATIONS: This patient's condition is  appropriate for continued rehabilitative care in the following setting: CIR Patient has agreed to participate in recommended program. Yes Note that insurance prior authorization may be required for reimbursement for recommended care.  Comment:  "I have personally performed a face to face diagnostic evaluation of this patient.  Additionally, I have reviewed and concur with the physician assistant's documentation above." Charlett Blake M.D. [facility] FAAPM&R (Sports Med, Neuromuscular Med) Diplomate Am Board of Electrodiagnostic Med   Cathlyn Parsons, Hershal Coria 02/24/2018

## 2018-02-24 NOTE — Care Management Important Message (Signed)
Important Message  Patient Details  Name: Wayne Booth MRN: [id number] Date of Birth: Jan 17, 1941   Medicare Important Message Given:  Yes    Zenon Mayo, RN 02/24/2018, 4:29 PM

## 2018-02-24 NOTE — Evaluation (Signed)
Occupational Therapy Evaluation Patient Details Name: Wayne Booth MRN: [id number] DOB: 11/04/1940 Today's Date: 02/24/2018    History of Present Illness 77 y.o. male with morbid obesity, atrial fibrillation s/p recent ablation, OSA noncompliant with CPAP, hypothyroidism, prior PE, renal and prostate cancer s/p nephrectomy here with multilobar pneumonia and acute on chronic diastolic heart failure. Transfered to ICU and intubated 8/17-8/22 when he was extubated, failed BiPAP use and was reintubated.  Extubated on 8/26. PT reordered.   Clinical Impression   Pt presenting with deficits in functional mobility, ADLs, and functional activity tolerance. Pt is very motivated to participate in therapy and to return to PLOF. Pt was able to progress to standing with max +2 assistance this session. Pt is a great candidate for CIR to improve stated deficits to ensure safe and independent d/c home.     Follow Up Recommendations  CIR    Equipment Recommendations  Other (comment)(defer to next venue)    Recommendations for Other Services Rehab consult;PT consult;Speech consult     Precautions / Restrictions Precautions Precautions: Fall Precaution Comments: O2 dependent; watch SpO2 Restrictions Weight Bearing Restrictions: No      Mobility Bed Mobility               General bed mobility comments: pt up in recliner  Transfers Overall transfer level: Needs assistance Equipment used: Rolling walker (2 wheeled) Transfers: Sit to/from Stand Sit to Stand: Max assist;+2 physical assistance         General transfer comment: max lifting A and vc for body mechanics    Balance Overall balance assessment: Needs assistance Sitting-balance support: Bilateral upper extremity supported;Feet supported Sitting balance-Leahy Scale: Poor Sitting balance - Comments: once feet on floor and given time to acclimate to sitting able to sit with min guard for balance   Standing balance support:  Bilateral upper extremity supported Standing balance-Leahy Scale: Fair Standing balance comment: able to static stand for 30 sec without support                           ADL either performed or assessed with clinical judgement   ADL Overall ADL's : Needs assistance/impaired Eating/Feeding: Independent   Grooming: Wash/dry hands;Sitting;Modified independent   Upper Body Bathing: Sitting;Set up   Lower Body Bathing: Moderate assistance;Sitting/lateral leans   Upper Body Dressing : Set up;Sitting   Lower Body Dressing: Maximal assistance;Sit to/from stand   Toilet Transfer: Maximal assistance;+2 for physical assistance;+2 for safety/equipment;Stand-pivot;RW;Requires wide/bariatric   Toileting- Clothing Manipulation and Hygiene: Total assistance;+2 for physical assistance;+2 for safety/equipment       Functional mobility during ADLs: Total assistance;+2 for physical assistance;+2 for safety/equipment;Rolling walker       Vision Baseline Vision/History: No visual deficits Patient Visual Report: No change from baseline Vision Assessment?: No apparent visual deficits     Perception     Praxis      Pertinent Vitals/Pain Pain Assessment: No/denies pain     Hand Dominance Right   Extremity/Trunk Assessment Upper Extremity Assessment Upper Extremity Assessment: Generalized weakness   Lower Extremity Assessment Lower Extremity Assessment: Defer to PT evaluation   Cervical / Trunk Assessment Cervical / Trunk Assessment: Kyphotic   Communication Communication Communication: No difficulties   Cognition Arousal/Alertness: Awake/alert Behavior During Therapy: WFL for tasks assessed/performed Overall Cognitive Status: Impaired/Different from baseline  Problem Solving: Slow processing General Comments: Pt AOx4 and despite some slow processing, WFL for all tasks assessed   General Comments  With 30 seconds of sitting  up in recliner (back not supported) pt's O2 dropped to 85%. with rest break, pt able to progress to standing with no significant drop in O2    Exercises     Shoulder Instructions      Home Living Family/patient expects to be discharged to:: Private residence Living Arrangements: Spouse/significant other Available Help at Discharge: Family;Available 24 hours/day Type of Home: House Home Access: Level entry     Home Layout: Two level;Able to live on main level with bedroom/bathroom     Bathroom Shower/Tub: Occupational psychologist: Standard Bathroom Accessibility: Yes How Accessible: Accessible via walker Home Equipment: [place] - single point          Prior Functioning/Environment Level of Independence: Independent                 OT Problem List: Pain;Decreased knowledge of precautions;Cardiopulmonary status limiting activity;Decreased knowledge of use of DME or AE;Decreased safety awareness;Impaired balance (sitting and/or standing);Decreased activity tolerance;Decreased strength;Decreased coordination      OT Treatment/Interventions: Self-care/ADL training;Therapeutic exercise;Patient/family education;Balance training;Therapeutic activities;Neuromuscular education;Energy conservation;DME and/or AE instruction    OT Goals(Current goals can be found in the care plan section) Acute Rehab OT Goals Patient Stated Goal: to return to independent OT Goal Formulation: With patient Time For Goal Achievement: 03/07/18 Potential to Achieve Goals: Good  OT Frequency: Min 3X/week   Barriers to D/C:            Co-evaluation              AM-PAC PT "6 Clicks" Daily Activity     Outcome Measure Help from another person eating meals?: None Help from another person taking care of personal grooming?: None Help from another person toileting, which includes using toliet, bedpan, or urinal?: A Lot Help from another person bathing (including washing, rinsing,  drying)?: A Lot Help from another person to put on and taking off regular upper body clothing?: A Lot Help from another person to put on and taking off regular lower body clothing?: A Lot 6 Click Score: 16   End of Session Equipment Utilized During Treatment: Gait belt;Rolling walker Nurse Communication: Mobility status  Activity Tolerance: Patient tolerated treatment well Patient left: in chair;with chair alarm set;with call bell/phone within reach;with family/visitor present  OT Visit Diagnosis: Unsteadiness on feet (R26.81);Muscle weakness (generalized) (M62.81)                Time: 1120-1150 OT Time Calculation (min): 30 min Charges:  OT General Charges $OT Visit: 1 Visit OT Evaluation $OT Eval Moderate Complexity: 1 Mod OT Treatments $Self Care/Home Management : 8-22 mins  Curtis Sites OTR/L 02/24/2018, 12:59 PM

## 2018-02-24 NOTE — Progress Notes (Addendum)
Nutrition Consult/Follow Up  DOCUMENTATION CODES:   Obesity unspecified  INTERVENTION:    Ensure Enlive po TID, each supplement provides 350 kcal and 20 grams of protein  NUTRITION DIAGNOSIS:   Inadequate oral intake now related to dysphagia as evidenced by meal completion < 50%, ongoing  GOAL:   Patient will meet greater than or equal to 90% of their needs, progressing  MONITOR:   PO intake, Supplement acceptance, Labs, Skin, Weight trends, I & O's  ASSESSMENT:   77 yo male with PMH of HTN, arthritis, hypothyroidism, OSA, prostate cancer, renal cancer, CAD, and A fib who was admitted on 8/7 with chest pain, fever, dyspnea, bilateral LL consolidation and RML & RUL infiltrates. Required intubation on 8/17.  8/26 extubated 8/28 transferred out of [id number]  Pt's room curtain closed upon RD visit today. Did not disturb. S/p bedside swallow evaluation 8/27. Advanced to Dys 2-thin liquids. Per flowsheet records, patient's average PO intake is 45%.  Per Speech Path note, he is taking a long time to consume his meals. Would benefit from addition of nutrition supplements to maximize PO intake. Labs and medications reviewed. Receiving MVI daily. CBG's [id number].  Diet Order:   Diet Order            DIET DYS 2 Room service appropriate? Yes; Fluid consistency: Thin  Diet effective now             EDUCATION NEEDS:   No education needs have been identified at this time  Skin:  Skin Assessment: Reviewed RN Assessment  Last BM:  8/28  Height:   Ht Readings from Last 1 Encounters:  02/12/18 5\' 10"  (1.778 m)   Weight:   Wt Readings from Last 1 Encounters:  02/24/18 122.5 kg   Ideal Body Weight:  75.5 kg  BMI:  Body mass index is 38.75 kg/m.  Estimated Nutritional Needs:   Kcal:  1900-2100  Protein:  110-125 gm  Fluid:  1.9-2.1 L  Arthur Holms, RD, LDN Pager #: [phone] After-Hours Pager #: [phone]

## 2018-02-24 NOTE — Progress Notes (Signed)
Rehab Admissions Coordinator Note:  Patient was screened by Cleatrice Burke for appropriateness for an Inpatient Acute Rehab Consult per OT recommendation.   At this time, we are recommending Inpatient Rehab consult.  Cleatrice Burke 02/24/2018, 1:46 PM  I can be reached at [phone].

## 2018-02-24 NOTE — Progress Notes (Signed)
Patient ID: EPIFANIO LABRADOR, male   DOB: 1941-04-11, 77 y.o.   MRN: [id number]         [facility]  Date of Admission:  02/02/2018             ASSESSMENT: He is improving clinically.  He remains afebrile and recent blood cultures are negative.  There is no evidence of any active infection.  PLAN: 1. Continue observation off of antibiotics 2. I will sign off now  Active Problems:   History of renal cell carcinoma   Essential hypertension   OSA (obstructive sleep apnea)   Hypothyroidism   History of pulmonary embolism   Paroxysmal atrial fibrillation (HCC)   Respiratory failure (HCC)   HCAP (healthcare-associated pneumonia)   Pericardial effusion   SOB (shortness of breath)   Elevated troponin   Acute on chronic systolic and diastolic heart failure, NYHA class 2 (HCC)   Dyspnea   Pneumonia of right middle lobe due to infectious organism ([place])   Pneumonia due to methicillin resistant Staphylococcus aureus (MRSA) ([place])   Scheduled Meds: . apixaban  5 mg Oral BID  . bacitracin-polymyxin b   Left Eye QHS  . budesonide (PULMICORT) nebulizer solution  0.5 mg Nebulization BID  . chlorhexidine  15 mL Mouth Rinse BID  . Chlorhexidine Gluconate Cloth  6 each Topical Daily  . docusate sodium  100 mg Oral Daily  . escitalopram  20 mg Oral Daily  . feeding supplement (ENSURE ENLIVE)  237 mL Oral TID BM  . levothyroxine  150 mcg Oral QAC breakfast  . mouth rinse  15 mL Mouth Rinse q12n4p  . multivitamin with minerals  1 tablet Oral Daily  . nortriptyline  75 mg Oral QHS  . senna  1 tablet Oral Daily  . sodium chloride flush  10-40 mL Intracatheter Q12H   Continuous Infusions: . sodium chloride Stopped (02/23/18 0931)   PRN Meds:.albuterol, ipratropium-albuterol, polyvinyl alcohol, sodium chloride flush   SUBJECTIVE: He is feeling better each day.    Review of Systems: Review of Systems  Constitutional: Negative for chills and fever.    Respiratory: Positive for cough. Negative for sputum production and shortness of breath.   Gastrointestinal: Negative for diarrhea.    Allergies  Allergen Reactions  . Ambien [Zolpidem] Anxiety    Anxiety and agitation when tried as sleeper in hospital aug 2019    OBJECTIVE: Vitals:   02/24/18 0404 02/24/18 0724 02/24/18 0803 02/24/18 1201  BP: 122/71  137/69 116/72  Pulse: 79  80 88  Resp:   (!) 21 20  Temp:   (!) 97.4 F (36.3 C) (!) 96.4 F (35.8 C)  TempSrc:   Oral Axillary  SpO2: 93% 96% 95% 94%  Weight:      Height:       Body mass index is 38.75 kg/m.  Physical Exam  Constitutional: He is oriented to person, place, and time.  He is sitting up in a chair visiting with family.  He is eager to start physical therapy.  Cardiovascular: Normal rate, regular rhythm and normal heart sounds.  No murmur heard. Pulmonary/Chest: Effort normal and breath sounds normal.  Neurological: He is alert and oriented to person, place, and time.  Psychiatric: He has a normal mood and affect.    Lab Results Lab Results  Component Value Date   WBC 20.8 (H) 02/24/2018   HGB 15.2 02/24/2018   HCT 46.9 02/24/2018   MCV 91.2 02/24/2018   PLT 330  02/24/2018    Lab Results  Component Value Date   CREATININE 1.71 (H) 02/24/2018   BUN 53 (H) 02/24/2018   NA 139 02/24/2018   K 4.0 02/24/2018   CL 103 02/24/2018   CO2 23 02/24/2018    Lab Results  Component Value Date   ALT 119 (H) 02/24/2018   AST 61 (H) 02/24/2018   ALKPHOS 93 02/24/2018   BILITOT 1.0 02/24/2018     Microbiology: Recent Results (from the past 240 hour(s))  Culture, respiratory (non-expectorated)     Status: None   Collection Time: 02/15/18  4:01 AM  Result Value Ref Range Status   Specimen Description TRACHEAL ASPIRATE  Final   Special Requests NONE  Final   Gram Stain   Final    ABUNDANT WBC PRESENT,BOTH PMN AND MONONUCLEAR ABUNDANT GRAM POSITIVE COCCI FEW YEAST Performed at [facility],  [place] [address] [place], [place] [address]    Culture   Final    ABUNDANT METHICILLIN RESISTANT STAPHYLOCOCCUS AUREUS   Report Status 02/17/2018 FINAL  Final   Organism ID, Bacteria METHICILLIN RESISTANT STAPHYLOCOCCUS AUREUS  Final      Susceptibility   Methicillin resistant staphylococcus aureus - MIC*    CIPROFLOXACIN >=8 RESISTANT Resistant     ERYTHROMYCIN >=8 RESISTANT Resistant     GENTAMICIN <=0.5 SENSITIVE Sensitive     OXACILLIN >=4 RESISTANT Resistant     TETRACYCLINE <=1 SENSITIVE Sensitive     VANCOMYCIN <=0.5 SENSITIVE Sensitive     TRIMETH/SULFA <=10 SENSITIVE Sensitive     CLINDAMYCIN <=0.25 SENSITIVE Sensitive     RIFAMPIN <=0.5 SENSITIVE Sensitive     Inducible Clindamycin NEGATIVE Sensitive     * ABUNDANT METHICILLIN RESISTANT STAPHYLOCOCCUS AUREUS  Culture, blood (routine x 2)     Status: None   Collection Time: 02/18/18 10:38 AM  Result Value Ref Range Status   Specimen Description BLOOD LEFT ANTECUBITAL  Final   Special Requests   Final    BOTTLES DRAWN AEROBIC AND ANAEROBIC Blood Culture adequate volume   Culture   Final    NO GROWTH 5 DAYS Performed at [facility], [address], [place], [place] [address]    Report Status 02/23/2018 FINAL  Final  Culture, blood (routine x 2)     Status: None   Collection Time: 02/18/18 10:38 AM  Result Value Ref Range Status   Specimen Description BLOOD LEFT HAND  Final   Special Requests   Final    BOTTLES DRAWN AEROBIC ONLY Blood Culture results may not be optimal due to an inadequate volume of blood received in culture bottles   Culture   Final    NO GROWTH 5 DAYS Performed at [facility], [place] [address], [place], [place] [address]    Report Status 02/23/2018 FINAL  Final  Culture, respiratory (non-expectorated)     Status: None   Collection Time: 02/18/18 12:00 PM  Result Value Ref Range Status   Specimen Description TRACHEAL ASPIRATE  Final   Special Requests NONE  Final   Gram Stain   Final     RARE WBC PRESENT,BOTH PMN AND MONONUCLEAR NO ORGANISMS SEEN Performed at [facility], [place] [address] [place],  [address]    Culture FEW CANDIDA ALBICANS  Final   Report Status 02/20/2018 FINAL  Final  Culture, Urine     Status: None   Collection Time: 02/22/18 12:58 PM  Result Value Ref Range Status   Specimen Description  URINE, CATHETERIZED  Final   Special Requests NONE  Final   Culture   Final    NO GROWTH Performed at [facility], [place] [address], [place], [place] [address]    Report Status 02/23/2018 FINAL  Final  Culture, blood (routine x 2)     Status: None (Preliminary result)   Collection Time: 02/22/18  1:23 PM  Result Value Ref Range Status   Specimen Description BLOOD BLOOD LEFT HAND  Final   Special Requests   Final    BOTTLES DRAWN AEROBIC ONLY Blood Culture adequate volume   Culture   Final    NO GROWTH 2 DAYS Performed at [facility], [place] [address] [place], [place] [address]    Report Status PENDING  Incomplete  Culture, blood (single)     Status: None (Preliminary result)   Collection Time: 02/22/18  4:43 PM  Result Value Ref Range Status   Specimen Description BLOOD LEFT HAND  Final   Special Requests   Final    BOTTLES DRAWN AEROBIC ONLY Blood Culture results may not be optimal due to an inadequate volume of blood received in culture bottles   Culture   Final    NO GROWTH 2 DAYS Performed at [facility], [place] [address] [place], [place] [address]    Report Status PENDING  Incomplete    Michel Bickers, MD [facility] [place] [phone] pager   [phone] cell 02/24/2018, 1:14 PM

## 2018-02-24 NOTE — Progress Notes (Signed)
PROGRESS NOTE    Wayne Booth  RAQ:[id number] DOB: 06-24-1941 DOA: 02/02/2018 PCP: Marin Olp, MD    Brief Narrative:  77 year old male who presented with dyspnea and chest pain.  Does have a significant past medical history of atrial fibrillation, obstructive sleep apnea, hypothyroidism, hypertension and pulmonary embolism.  Reported 5 days of worsening dyspnea on exertion and associated with right-sided pleuritic chest pain.  On his initial physical examination blood pressure 117/60, heart rate 93, respiratory rate 34, temperature 101, oxygen saturation 94% on supplemental oxygen.  His lungs had no rhonchi or rails, heart S1-S2 present, no gallops, rubs or murmurs, abdomen soft nontender, no lower extremity.  His chest x-ray had increased interstitial infiltrates, cardiomegaly, hyperinflation, right upper lobe infiltrate.  Patient was admitted to the hospital with working diagnosis of toxic respiratory failure due to right upper lobe community-acquired pneumonia, complicated by sepsis.  Prolonged hospital stay, patient was diagnosed with MRSA pneumonia which required invasive mechanical ventilation (08/17), extubated August 26.  He did require vasopressors for septic shock. His hospital stay was complicated by decompensated heart failure and metabolic encephalopathy with delirium  Assessment & Plan:   Active Problems:   History of renal cell carcinoma   Essential hypertension   OSA (obstructive sleep apnea)   Hypothyroidism   History of pulmonary embolism   Paroxysmal atrial fibrillation (HCC)   Respiratory failure (HCC)   HCAP (healthcare-associated pneumonia)   Pericardial effusion   SOB (shortness of breath)   Elevated troponin   Acute on chronic systolic and diastolic heart failure, NYHA class 2 (HCC)   Dyspnea   Pneumonia of right middle lobe due to infectious organism ([place])   Pneumonia due to methicillin resistant Staphylococcus aureus (MRSA) ([place])   1. Acute  hypoxic respiratory failure due to MRSA pneumonia. Patient has completed antibiotic therapy, oxygen saturation at 96% on room air. Patient very weak and deconditioned will need SNF.   2. Resolving sepsis. Systolic blood pressure 456 to 137 mmHg. No further sings of volume overload, holding on further diuresis. Echocardiogram with preserved LV systolic function.   3. Metabolic encephalopathy with delirium. This am patient is more awake and alert, no confusion or agitation. Continue escitalopram and nortriptyline.    4. AKI on CKD stage 3. Peaked cr at 2,42, today down to 1,71, with K at 4,0 and serum bicarbonate at 23. Will continue to hold diuretics, avoid hypotension or nephrotoxic medications.   5. Atrial fibrillation. Rate controlled, will continue telemetry monitoring. Continue anticoagulation with apixaban.    6. COPD. Stable with no signs of exacerbation, will continue bronchodilator therapy with duoneb, and inhaled steroid with budesonide.   7. Hypothyroid. Continue levothyroxine.    DVT prophylaxis: apixaban  Code Status:  full Family Communication: no family at the bedside  Disposition Plan/ discharge barriers: pending placement at SNF    Consultants:   Cardiology   Procedures:     Antimicrobials:       Subjective: Patient reports improved dyspnea but not back to baseline, very weak and deconditioned, poor appetite, no nausea or vomiting. Positive loose stools. No chest pain.  Objective: Vitals:   02/23/18 2244 02/24/18 0306 02/24/18 0404 02/24/18 0724  BP: 119/66 (!) 118/100 122/71   Pulse: 78 81 79   Resp: (!) 22 (!) 22    Temp: 97.8 F (36.6 C) 97.8 F (36.6 C)    TempSrc: Axillary Axillary    SpO2: 94% 94% 93% 96%  Weight:  122.5 kg    Height:  Intake/Output Summary (Last 24 hours) at 02/24/2018 0803 Last data filed at 02/23/2018 2248 Gross per 24 hour  Intake 952.84 ml  Output 800 ml  Net 152.84 ml   Filed Weights   02/22/18 0200 02/23/18  0358 02/24/18 0306  Weight: 123 kg 122.5 kg 122.5 kg    Examination:   General: deconditioned  Neurology: Awake and alert, non focal  E ENT: mild pallor, no icterus, oral mucosa moist Cardiovascular: No JVD. S1-S2 present, rhythmic, no gallops, rubs, or murmurs. Trace lower extremity edema. Pulmonary: positive breath sounds bilaterally, no wheezing or rhonchi, scattered rales. Gastrointestinal. Abdomen protuberant and distended, no organomegaly, non tender, no rebound or guarding Skin. No rashes Musculoskeletal: no joint deformities     Data Reviewed: I have personally reviewed following labs and imaging studies  CBC: Recent Labs  Lab 02/19/18 0503 02/20/18 0508 02/21/18 0446 02/22/18 0555 02/23/18 0348 02/24/18 0320  WBC 25.3* 22.3* 21.4* 23.6* 21.0* 20.8*  NEUTROABS 21.2*  --   --  17.3* 14.8*  --   HGB 13.1 14.5 14.3 16.2 15.0 15.2  HCT 42.4 47.6 45.8 50.8 47.0 46.9  MCV 93.0 94.1 93.5 92.2 92.5 91.2  PLT 478* 465* 427* 447* 351 166   Basic Metabolic Panel: Recent Labs  Lab 02/18/18 0416 02/19/18 0503 02/20/18 0508 02/21/18 0446 02/22/18 0555 02/23/18 0348 02/24/18 0320  NA 141 142 142 142 146* 141 139  K 3.9 3.7 3.6 3.5 3.8 3.2* 4.0  CL 101 106 102 103 103 101 103  CO2 32 28 30 30 30 29 23   GLUCOSE 120* 138* 181* 157* 126* 126* 81  BUN 78* 67* 72* 79* 73* 67* 53*  CREATININE 1.83* 1.65* 2.14* 1.93* 2.42* 1.94* 1.71*  CALCIUM 8.5* 8.1* 8.9 8.7* 9.7 8.9 8.9  MG 2.4 2.3 2.2 2.4 2.2 2.4  --   PHOS 2.9  --  4.8* 4.1 4.3 5.4*  --    GFR: Estimated Creatinine Clearance: 49 mL/min (A) (by C-G formula based on SCr of 1.71 mg/dL (H)). Liver Function Tests: Recent Labs  Lab 02/20/18 0508 02/21/18 0446 02/22/18 0555 02/23/18 0348 02/24/18 0320  AST 161* 121* 75* 46* 61*  ALT 312* 290* 233* 155* 119*  ALKPHOS 120 125 133* 107 93  BILITOT 1.6* 1.3* 1.8* 1.3* 1.0  PROT 7.1 6.8 7.6 6.8 6.7  ALBUMIN 2.6* 2.5* 2.8* 2.6* 2.6*   No results for input(s):  LIPASE, AMYLASE in the last 168 hours. No results for input(s): AMMONIA in the last 168 hours. Coagulation Profile: No results for input(s): INR, PROTIME in the last 168 hours. Cardiac Enzymes: No results for input(s): CKTOTAL, CKMB, CKMBINDEX, TROPONINI in the last 168 hours. BNP (last 3 results) No results for input(s): PROBNP in the last 8760 hours. HbA1C: No results for input(s): HGBA1C in the last 72 hours. CBG: Recent Labs  Lab 02/22/18 1542 02/22/18 2025 02/23/18 0344 02/23/18 0740 02/24/18 0621  GLUCAP 104* 110* 119* 132* 98   Lipid Profile: No results for input(s): CHOL, HDL, LDLCALC, TRIG, CHOLHDL, LDLDIRECT in the last 72 hours. Thyroid Function Tests: No results for input(s): TSH, T4TOTAL, FREET4, T3FREE, THYROIDAB in the last 72 hours. Anemia Panel: No results for input(s): VITAMINB12, FOLATE, FERRITIN, TIBC, IRON, RETICCTPCT in the last 72 hours.    Radiology Studies: I have reviewed all of the imaging during this hospital visit personally     Scheduled Meds: . apixaban  5 mg Oral BID  . bacitracin-polymyxin b   Left Eye QHS  . budesonide (  PULMICORT) nebulizer solution  0.5 mg Nebulization BID  . chlorhexidine  15 mL Mouth Rinse BID  . Chlorhexidine Gluconate Cloth  6 each Topical Daily  . docusate sodium  100 mg Oral Daily  . escitalopram  20 mg Oral Daily  . levothyroxine  150 mcg Oral QAC breakfast  . mouth rinse  15 mL Mouth Rinse q12n4p  . multivitamin with minerals  1 tablet Oral Daily  . nortriptyline  75 mg Oral QHS  . senna  1 tablet Oral Daily  . sodium chloride flush  10-40 mL Intracatheter Q12H   Continuous Infusions: . sodium chloride Stopped (02/23/18 0931)     LOS: 22 days        Makyi Ledo Gerome Apley, MD [facility] Pager [phone]

## 2018-02-25 LAB — CBC WITH DIFFERENTIAL/PLATELET
ABS IMMATURE GRANULOCYTES: 0.3 10*3/uL — AB (ref 0.0–0.1)
BASOS ABS: 0.1 10*3/uL (ref 0.0–0.1)
BASOS PCT: 1 %
EOS ABS: 0.9 10*3/uL — AB (ref 0.0–0.7)
Eosinophils Relative: 5 %
HCT: 44.5 % (ref 39.0–52.0)
Hemoglobin: 14.2 g/dL (ref 13.0–17.0)
IMMATURE GRANULOCYTES: 2 %
Lymphocytes Relative: 9 %
Lymphs Abs: 1.8 10*3/uL (ref 0.7–4.0)
MCH: 29.2 pg (ref 26.0–34.0)
MCHC: 31.9 g/dL (ref 30.0–36.0)
MCV: 91.4 fL (ref 78.0–100.0)
MONOS PCT: 7 %
Monocytes Absolute: 1.5 10*3/uL — ABNORMAL HIGH (ref 0.1–1.0)
NEUTROS ABS: 14.9 10*3/uL — AB (ref 1.7–7.7)
NEUTROS PCT: 76 %
PLATELETS: 344 10*3/uL (ref 150–400)
RBC: 4.87 MIL/uL (ref 4.22–5.81)
RDW: 15.2 % (ref 11.5–15.5)
WBC: 19.5 10*3/uL — AB (ref 4.0–10.5)

## 2018-02-25 LAB — BASIC METABOLIC PANEL
ANION GAP: 10 (ref 5–15)
BUN: 39 mg/dL — AB (ref 8–23)
CO2: 26 mmol/L (ref 22–32)
Calcium: 9 mg/dL (ref 8.9–10.3)
Chloride: 99 mmol/L (ref 98–111)
Creatinine, Ser: 1.64 mg/dL — ABNORMAL HIGH (ref 0.61–1.24)
GFR calc Af Amer: 46 mL/min — ABNORMAL LOW (ref >60)
GFR, EST NON AFRICAN AMERICAN: 39 mL/min — AB (ref >60)
GLUCOSE: 165 mg/dL — AB (ref 70–99)
Potassium: 4 mmol/L (ref 3.5–5.1)
SODIUM: 135 mmol/L (ref 135–145)

## 2018-02-25 NOTE — Progress Notes (Signed)
Inpatient Rehabilitation-Admissions Coordinator   [facility] still awaiting insurance approval for CIR.   AC will follow up once decision has been made.  Please call if questions.   Jhonnie Garner, OTR/L  Rehab Admissions Coordinator  [phone] 02/25/2018 5:03 PM

## 2018-02-25 NOTE — Progress Notes (Signed)
Inpatient Rehabilitation-Admissions Coordinator    Met with patient at the bedside to discuss team's recommendation for inpatient rehabilitation. Shared booklets, expectations while in CIR, expected length of stay, and anticipated functional level at DC. Pt wanting to pursue CIR at this time.  AC has begun insurance authorization process for possible admit to CIR.  Please call if questions  Jhonnie Garner, OTR/L  Rehab Admissions Coordinator  [phone] 02/25/2018 9:06 AM

## 2018-02-25 NOTE — Progress Notes (Signed)
  Speech Language Pathology Treatment: Dysphagia  Patient Details Name: Wayne Booth MRN: [id number] DOB: 20-Feb-1941 Today's Date: 02/25/2018 Time: 0964-3838 SLP Time Calculation (min) (ACUTE ONLY): 16 min  Assessment / Plan / Recommendation Clinical Impression  Sitting up in chair; strong vocal intensity and clear quality anticipating CIR transfer pending insurance approval. Session focused on potential diet recommendation for regular as pt currently on Dys 2 (minced/ground). Rotary mastication pattern; duration and efficiency of oral transfer adequate void of oral residue. No s/s aspiration throughout session. Pt agreeable and anticipating upgraded regular texture recommendation, continue thin and all aspiration precautions. Will follow along while on acute care.    HPI HPI: Pt is a 77 y.o. male with hx of atrial fibrillation status post recent ablation, OSA, hypothyroidism, PE, hypertension, both renal and prostate cancer, and CAD. Presented to ED with SOB and right side pleuritic chest pain. Pt was one way extubated 02/21/18 (initially intubated 02/12/18), remains on feeding tube. CXR interval extubation of the trachea and esophagus. Slight interval improvement in bibasilar atelectasis or pneumonia. Slightly decreased pulmonary vascular congestion.      SLP Plan  Continue with current plan of care       Recommendations  Diet recommendations: Regular;Thin liquid Liquids provided via: Cup;Straw Medication Administration: Whole meds with liquid Supervision: Patient able to self feed;Intermittent supervision to cue for compensatory strategies Compensations: Slow rate;Small sips/bites;Lingual sweep for clearance of pocketing Postural Changes and/or Swallow Maneuvers: Seated upright 90 degrees                Oral Care Recommendations: Oral care BID Follow up Recommendations: Inpatient Rehab SLP Visit Diagnosis: Dysphagia, oropharyngeal phase (R13.12) Plan: Continue with current  plan of care       GO                Houston Siren 02/25/2018, 11:51 AM Wayne Booth.Ed [employer] [phone]

## 2018-02-25 NOTE — Progress Notes (Signed)
PROGRESS NOTE [facility]   STANELY SEXSON   UXL:[id number] DOB: 12-12-1940  DOA: 02/02/2018 PCP: Marin Olp, MD   Brief Narrative:  Wayne Booth is a 77 year old male who presented with dyspnea and chest pain.  Does have a significant past medical history of atrial fibrillation, obstructive sleep apnea, hypothyroidism, hypertension and pulmonary embolism.  Reported 5 days of worsening dyspnea on exertion and associated with right-sided pleuritic chest pain.  On his initial physical examination blood pressure 117/60, heart rate 93, respiratory rate 34, temperature 101, oxygen saturation 94% on supplemental oxygen.  His lungs had no rhonchi or rails, heart S1-S2 present, no gallops, rubs or murmurs, abdomen soft nontender, no lower extremity.  His chest x-ray had increased interstitial infiltrates, cardiomegaly, hyperinflation, right upper lobe infiltrate.  Patient was admitted to the hospital with working diagnosis of toxic respiratory failure due to right upper lobe community-acquired pneumonia, complicated by sepsis.  Prolonged hospital stay, patient was diagnosed with MRSA pneumonia which required invasive mechanical ventilation (08/17), extubated August 26.  He did require vasopressors for septic shock. His hospital stay was complicated by decompensated heart failure and metabolic encephalopathy with delirium  Subjective: Patient is feeling much better, report is ready to go to rehab.  No complaints today.  Remains afebrile.  No acute events overnight.  Assessment & Plan:   Active Problems:   History of renal cell carcinoma   Essential hypertension   OSA (obstructive sleep apnea)   Hypothyroidism   History of pulmonary embolism   Paroxysmal atrial fibrillation (HCC)   Respiratory failure (HCC)   HCAP (healthcare-associated pneumonia)   Pericardial effusion   SOB (shortness of breath)   Elevated troponin   Acute on chronic systolic and diastolic heart failure,  NYHA class 2 (HCC)   Dyspnea   Pneumonia of right middle lobe due to infectious organism ([place])   Pneumonia due to methicillin resistant Staphylococcus aureus (MRSA) ([facility])  1. Acute hypoxic respiratory failure due to MRSA pneumonia.  Patient completed antibiotic therapy, he is oxygen saturation remains stable on room air.  Due to prolonged hospitalization patient is very weak and deconditioned.  Patient was accepted to CIR however awaiting insurance authorization.  2. Sepsis secondary to MRSA pneumonia. Sepsis physiology has resolved.  Blood pressure remained stable.  No signs of fluid overload.  He was treated with diuresis due to fluid overload from aggressive IV hydration.  Echocardiogram shows preserved LV systolic function.   3. Metabolic encephalopathy with delirium.  Resolved, continue citalopram and nortriptyline  This am patient is more awake and alert, no confusion or agitation. Continue escitalopram and nortriptyline.    4. AKI on CKD stage 3.  Creatinine Nadir 2.4 to, creatinine continue to trending down, today 1.6.  We will continue to hold diuresis and avoid nephrotoxic agents.  Check BMP sporadically.  Baseline creatinine 1.2.  5. Atrial fibrillation.  Heart rate well controlled, continue Eliquis for anticoagulation.  Not on rate control medication at this time.  6. COPD.  Stable, no wheezing.  Being treated with Pulmicort and duo nebs as needed.  Upon discharge we will switch to [facility] and continue albuterol as needed.  7. Hypothyroid.  Continue Synthroid  DVT prophylaxis: Eliquis Code Status: Full code Family Communication: None at bedside Disposition Plan: CIR in a.m.  Consultants:   Cardiology  Procedures:     Antimicrobials: Anti-infectives (From admission, onward)   Start     Dose/Rate Route Frequency Ordered Stop   02/20/18 1000  ceftaroline (  TEFLARO) 400 mg in sodium chloride 0.9 % 250 mL IVPB  Status:  Discontinued    Note to Pharmacy:   May adjust dose as indicated   400 mg 250 mL/hr over 60 Minutes Intravenous Every 12 hours 02/20/18 0950 02/23/18 1149   02/19/18 1400  ceftaroline (TEFLARO) 600 mg in sodium chloride 0.9 % 250 mL IVPB  Status:  Discontinued    Note to Pharmacy:  May adjust dose as indicated   600 mg 250 mL/hr over 60 Minutes Intravenous Every 12 hours 02/19/18 1310 02/20/18 0950   02/18/18 1400  piperacillin-tazobactam (ZOSYN) IVPB 3.375 g  Status:  Discontinued     3.375 g 12.5 mL/hr over 240 Minutes Intravenous Every 8 hours 02/18/18 1114 02/19/18 1310   02/17/18 2100  vancomycin (VANCOCIN) 1,500 mg in sodium chloride 0.9 % 500 mL IVPB  Status:  Discontinued     1,500 mg 250 mL/hr over 120 Minutes Intravenous Every 24 hours 02/16/18 1119 02/19/18 1310   02/15/18 1400  cefTRIAXone (ROCEPHIN) 2 g in sodium chloride 0.9 % 100 mL IVPB  Status:  Discontinued     2 g 200 mL/hr over 30 Minutes Intravenous Every 24 hours 02/15/18 1036 02/16/18 1114   02/14/18 2100  vancomycin (VANCOCIN) 1,750 mg in sodium chloride 0.9 % 500 mL IVPB     1,750 mg 250 mL/hr over 120 Minutes Intravenous Every 48 hours 02/13/18 1125 02/17/18 2317   02/13/18 2100  vancomycin (VANCOCIN) 1,500 mg in sodium chloride 0.9 % 500 mL IVPB  Status:  Discontinued     1,500 mg 250 mL/hr over 120 Minutes Intravenous Every 24 hours 02/12/18 2010 02/13/18 1125   02/13/18 1300  vancomycin (VANCOCIN) 1,500 mg in sodium chloride 0.9 % 500 mL IVPB  Status:  Discontinued     1,500 mg 250 mL/hr over 120 Minutes Intravenous Every 24 hours 02/12/18 1234 02/12/18 2010   02/12/18 2015  vancomycin (VANCOCIN) 2,000 mg in sodium chloride 0.9 % 500 mL IVPB     2,000 mg 250 mL/hr over 120 Minutes Intravenous  Once 02/12/18 2010 02/12/18 2300   02/12/18 1800  piperacillin-tazobactam (ZOSYN) IVPB 3.375 g  Status:  Discontinued     3.375 g 12.5 mL/hr over 240 Minutes Intravenous Every 8 hours 02/12/18 1313 02/12/18 1315   02/12/18 1400  ceFEPIme (MAXIPIME) 1 g  in sodium chloride 0.9 % 100 mL IVPB  Status:  Discontinued     1 g 200 mL/hr over 30 Minutes Intravenous Every 8 hours 02/12/18 1234 02/12/18 1313   02/12/18 1400  piperacillin-tazobactam (ZOSYN) IVPB 3.375 g  Status:  Discontinued     3.375 g 12.5 mL/hr over 240 Minutes Intravenous Every 8 hours 02/12/18 1315 02/15/18 1036   02/12/18 1315  piperacillin-tazobactam (ZOSYN) IVPB 3.375 g  Status:  Discontinued     3.375 g 100 mL/hr over 30 Minutes Intravenous  Once 02/12/18 1313 02/12/18 1315   02/12/18 1300  vancomycin (VANCOCIN) 2,000 mg in sodium chloride 0.9 % 500 mL IVPB  Status:  Discontinued     2,000 mg 250 mL/hr over 120 Minutes Intravenous  Once 02/12/18 1234 02/12/18 2010   02/06/18 1100  azithromycin (ZITHROMAX) 500 mg in sodium chloride 0.9 % 250 mL IVPB  Status:  Discontinued     500 mg 250 mL/hr over 60 Minutes Intravenous Every 24 hours 02/06/18 1024 02/12/18 1218   02/03/18 2000  vancomycin (VANCOCIN) 1,500 mg in sodium chloride 0.9 % 500 mL IVPB  Status:  Discontinued  1,500 mg 250 mL/hr over 120 Minutes Intravenous Every 24 hours 02/02/18 1948 02/03/18 1122   02/03/18 0400  ceFEPIme (MAXIPIME) 1 g in sodium chloride 0.9 % 100 mL IVPB     1 g 200 mL/hr over 30 Minutes Intravenous Every 8 hours 02/03/18 0007 02/10/18 2254   02/02/18 1845  vancomycin (VANCOCIN) 2,500 mg in sodium chloride 0.9 % 500 mL IVPB     2,500 mg 250 mL/hr over 120 Minutes Intravenous STAT 02/02/18 1832 02/02/18 2154   02/02/18 1845  ceFEPIme (MAXIPIME) 2 g in sodium chloride 0.9 % 100 mL IVPB     2 g 200 mL/hr over 30 Minutes Intravenous  Once 02/02/18 1833 02/02/18 1916   02/02/18 1815  ceFEPIme (MAXIPIME) 1 g in sodium chloride 0.9 % 100 mL IVPB  Status:  Discontinued     1 g 200 mL/hr over 30 Minutes Intravenous  Once 02/02/18 1802 02/02/18 1833          Objective: Vitals:   02/25/18 0400 02/25/18 0725 02/25/18 1126 02/25/18 1558  BP: 121/69 130/87 132/81 (!) 154/99  Pulse: 87 85 86  87  Resp:  (!) 22 20 20   Temp: 97.7 F (36.5 C) 97.7 F (36.5 C) 97.9 F (36.6 C) (!) 97.5 F (36.4 C)  TempSrc: Oral Oral Axillary Oral  SpO2: 100% 97% 97% 98%  Weight:      Height:        Intake/Output Summary (Last 24 hours) at 02/25/2018 1852 Last data filed at 02/25/2018 0530 Gross per 24 hour  Intake 222 ml  Output 1800 ml  Net -1578 ml   Filed Weights   02/22/18 0200 02/23/18 0358 02/24/18 0306  Weight: 123 kg 122.5 kg 122.5 kg    Examination:  General exam: Deconditioned HEENT: OP moist and clear Respiratory system: Good air entry, no wheezing or rhonchi, scattered rales. Cardiovascular system: S1 & S2 heard, RRR. No JVD, murmurs, rubs or gallops Gastrointestinal system: Abdomen is nondistended, soft and nontender. Normal bowel sounds heard. Central nervous system: Alert and oriented. No focal neurological deficits. Extremities: Trace lower extremity edema bilaterally Skin: No rashes Psychiatry: Mood & affect appropriate.   Data Reviewed: I have personally reviewed following labs and imaging studies  CBC: Recent Labs  Lab 02/19/18 0503  02/21/18 0446 02/22/18 0555 02/23/18 0348 02/24/18 0320 02/25/18 0158  WBC 25.3*   < > 21.4* 23.6* 21.0* 20.8* 19.5*  NEUTROABS 21.2*  --   --  17.3* 14.8*  --  14.9*  HGB 13.1   < > 14.3 16.2 15.0 15.2 14.2  HCT 42.4   < > 45.8 50.8 47.0 46.9 44.5  MCV 93.0   < > 93.5 92.2 92.5 91.2 91.4  PLT 478*   < > 427* 447* 351 330 344   < > = values in this interval not displayed.   Basic Metabolic Panel: Recent Labs  Lab 02/19/18 0503 02/20/18 0508 02/21/18 0446 02/22/18 0555 02/23/18 0348 02/24/18 0320 02/25/18 0158  NA 142 142 142 146* 141 139 135  K 3.7 3.6 3.5 3.8 3.2* 4.0 4.0  CL 106 102 103 103 101 103 99  CO2 28 30 30 30 29 23 26   GLUCOSE 138* 181* 157* 126* 126* 81 165*  BUN 67* 72* 79* 73* 67* 53* 39*  CREATININE 1.65* 2.14* 1.93* 2.42* 1.94* 1.71* 1.64*  CALCIUM 8.1* 8.9 8.7* 9.7 8.9 8.9 9.0  MG 2.3 2.2  2.4 2.2 2.4  --   --   PHOS  --  4.8* 4.1 4.3 5.4*  --   --    GFR: Estimated Creatinine Clearance: 51.1 mL/min (A) (by C-G formula based on SCr of 1.64 mg/dL (H)). Liver Function Tests: Recent Labs  Lab 02/20/18 0508 02/21/18 0446 02/22/18 0555 02/23/18 0348 02/24/18 0320  AST 161* 121* 75* 46* 61*  ALT 312* 290* 233* 155* 119*  ALKPHOS 120 125 133* 107 93  BILITOT 1.6* 1.3* 1.8* 1.3* 1.0  PROT 7.1 6.8 7.6 6.8 6.7  ALBUMIN 2.6* 2.5* 2.8* 2.6* 2.6*   No results for input(s): LIPASE, AMYLASE in the last 168 hours. No results for input(s): AMMONIA in the last 168 hours. Coagulation Profile: No results for input(s): INR, PROTIME in the last 168 hours. Cardiac Enzymes: No results for input(s): CKTOTAL, CKMB, CKMBINDEX, TROPONINI in the last 168 hours. BNP (last 3 results) No results for input(s): PROBNP in the last 8760 hours. HbA1C: No results for input(s): HGBA1C in the last 72 hours. CBG: Recent Labs  Lab 02/22/18 1542 02/22/18 2025 02/23/18 0344 02/23/18 0740 02/24/18 0621  GLUCAP 104* 110* 119* 132* 98   Lipid Profile: No results for input(s): CHOL, HDL, LDLCALC, TRIG, CHOLHDL, LDLDIRECT in the last 72 hours. Thyroid Function Tests: No results for input(s): TSH, T4TOTAL, FREET4, T3FREE, THYROIDAB in the last 72 hours. Anemia Panel: No results for input(s): VITAMINB12, FOLATE, FERRITIN, TIBC, IRON, RETICCTPCT in the last 72 hours. Sepsis Labs: Recent Labs  Lab 02/19/18 0503  PROCALCITON 0.18    Recent Results (from the past 240 hour(s))  Culture, blood (routine x 2)     Status: None   Collection Time: 02/18/18 10:38 AM  Result Value Ref Range Status   Specimen Description BLOOD LEFT ANTECUBITAL  Final   Special Requests   Final    BOTTLES DRAWN AEROBIC AND ANAEROBIC Blood Culture adequate volume   Culture   Final    NO GROWTH 5 DAYS Performed at [facility], [address], [place], [place] [address]    Report Status 02/23/2018 FINAL  Final    Culture, blood (routine x 2)     Status: None   Collection Time: 02/18/18 10:38 AM  Result Value Ref Range Status   Specimen Description BLOOD LEFT HAND  Final   Special Requests   Final    BOTTLES DRAWN AEROBIC ONLY Blood Culture results may not be optimal due to an inadequate volume of blood received in culture bottles   Culture   Final    NO GROWTH 5 DAYS Performed at [facility], [place] [address], [place], [place] [address]    Report Status 02/23/2018 FINAL  Final  Culture, respiratory (non-expectorated)     Status: None   Collection Time: 02/18/18 12:00 PM  Result Value Ref Range Status   Specimen Description TRACHEAL ASPIRATE  Final   Special Requests NONE  Final   Gram Stain   Final    RARE WBC PRESENT,BOTH PMN AND MONONUCLEAR NO ORGANISMS SEEN Performed at [facility], [place] [address], [place], [place] [address]    Culture FEW CANDIDA ALBICANS  Final   Report Status 02/20/2018 FINAL  Final  Culture, Urine     Status: None   Collection Time: 02/22/18 12:58 PM  Result Value Ref Range Status   Specimen Description URINE, CATHETERIZED  Final   Special Requests NONE  Final   Culture   Final    NO GROWTH Performed at [facility], [place] [address] [place],  [address]  Report Status 02/23/2018 FINAL  Final  Culture, blood (routine x 2)     Status: None (Preliminary result)   Collection Time: 02/22/18  1:23 PM  Result Value Ref Range Status   Specimen Description BLOOD BLOOD LEFT HAND  Final   Special Requests   Final    BOTTLES DRAWN AEROBIC ONLY Blood Culture adequate volume   Culture   Final    NO GROWTH 3 DAYS Performed at [facility], [address] [place], [place] [address]    Report Status PENDING  Incomplete  Culture, blood (single)     Status: None (Preliminary result)   Collection Time: 02/22/18  4:43 PM  Result Value Ref Range Status   Specimen Description BLOOD LEFT HAND  Final   Special Requests   Final    BOTTLES DRAWN  AEROBIC ONLY Blood Culture results may not be optimal due to an inadequate volume of blood received in culture bottles   Culture   Final    NO GROWTH 3 DAYS Performed at [facility], [place] [address] [place], [place] [address]    Report Status PENDING  Incomplete      Radiology Studies: No results found.    Scheduled Meds: . apixaban  5 mg Oral BID  . bacitracin-polymyxin b   Left Eye QHS  . budesonide (PULMICORT) nebulizer solution  0.5 mg Nebulization BID  . chlorhexidine  15 mL Mouth Rinse BID  . Chlorhexidine Gluconate Cloth  6 each Topical Daily  . docusate sodium  100 mg Oral Daily  . escitalopram  20 mg Oral Daily  . feeding supplement (ENSURE ENLIVE)  237 mL Oral TID BM  . levothyroxine  150 mcg Oral QAC breakfast  . mouth rinse  15 mL Mouth Rinse q12n4p  . multivitamin with minerals  1 tablet Oral Daily  . nortriptyline  75 mg Oral QHS  . senna  1 tablet Oral Daily  . sodium chloride flush  10-40 mL Intracatheter Q12H   Continuous Infusions: . sodium chloride Stopped (02/23/18 0931)     LOS: 23 days    Time spent: Total of 15 minutes spent with pt, greater than 50% of which was spent in discussion of  treatment, counseling and coordination of care  Chipper Oman, MD Pager: Text Page via [email or web address]   If 7PM-7AM, please contact night-coverage [email or web address] 02/25/2018, 6:52 PM   Note - This record has been created using [employer]. Chart creation errors have been sought, but may not always have been located. Such creation errors do not reflect on the standard of medical care.

## 2018-02-25 NOTE — Social Work (Signed)
CSW following for disposition.  [place], [place] [facility] [phone]

## 2018-02-25 NOTE — Progress Notes (Addendum)
Inpatient Rehabilitation-Admissions Coordinator   [facility] has just received insurance approval for admit to CIR.   Plan for pt to admit to CIR this Saturday 02/26/18, pending medical approval.   Pt and family aware of plans.   Please call if questions.   Jhonnie Garner, OTR/L  Rehab Admissions Coordinator  [phone] 02/25/2018 5:43 PM

## 2018-02-25 NOTE — PMR Pre-admission (Signed)
PMR Admission Coordinator Pre-Admission Assessment  Patient: Wayne Booth is an 77 y.o., male MRN: [id number] DOB: 1941-01-31 Height: 5' 10" (177.8 cm) Weight: 122.5 kg              Insurance Information HMO: yes    PPO:      PCP:      IPA:      80/20:      OTHER:  PRIMARY: [employer] Medicare      Policy#: [id number]      Subscriber: Patient CM Name: Vevelyn Royals       Phone#: [phone]     Fax#: [phone] Auth received from Liliane Shi on 8/30 for admit 02/26/18. Clinical updates are due to Vevelyn Royals on day 7 from admit (03/04/18). Pre-Cert#: [id number]      Employer:  Benefits:  Phone #: NA     Name: [email or web address] Eff. Date: 06/29/17     Deduct: $0      Out of Pocket Max: $4,400 (Met $ [phone])      Life Max: NA CIR: $345/day for days 1-5, $0/day for days 6+      SNF: $0/day for days 1-20, $160/day for days 21-48, $0/day for days 49-100; 100 day SNF limit Outpatient: per necessity     Co-Pay: $40/visit Home Health: 100% per necessity      Co-Pay:  DME: 80%     Co-Pay: 20% Providers:   Medicaid Application Date:       Case Manager:  Disability Application Date:       Case Worker:   Emergency Contact Information Contact Information    Name Relation Home Work Mobile   Boissevain Spouse [phone]     Sherrell, Farish   [phone]   Armas, Mcbee   [phone]     Current Medical History  Patient Admitting [facility] after pneumonia and respiratory failure  History of Present Illness:   Wayne Booth is a 77 year old right-handed male with history of atrial fibrillation status post ablation maintained on Eliquis, sleep apnea, COPD, CKD stage III, history of pulmonary emboli, hypertension.  Per chart review patient lives with spouse and independent prior to admission with occasional use of a cane.  Two-level home with bedroom on [address].  Presented 02/03/2018 with increasing shortness of breath and decreased mobility over a 5-day span as well as  associated right-sided pleuritic chest pain.  Chest x-ray showed right lung mid lung zone consolidation concerning for pneumonia.  Perfusion scan low probability for pulmonary emboli.  CT of the chest showed bilateral multilobar pneumonia involving the right upper, middle and both lower lobes.  Mild hyponatremia 131, troponin 0.12, lactic acid 1.51.  Placed on broad-spectrum antibiotics for suspect sepsis.  Echocardiogram with ejection fraction of 55% no wall motion abnormalities.  Patient did require intubation for acute respiratory failure 02/12/2018 until 02/17/2018.  Persistent leukocytosis 23,600 improved until 19,500 infectious disease consulted antibiotic course completed advised to continue to monitor. Recent blood cultures negative. MRSA PCR screening positive with contact precautions.  Therapy evaluations completed with recommendations of physical medicine rehab consult.  Patient is to be admitted for a comprehensive rehab program on 02/26/18.    Past Medical History  Past Medical History:  Diagnosis Date  . Allergy    States his nose runs when he is around grease.   Marland Kitchen Anxiety   . Arthritis   . Atrial fibrillation ([place])   . Cancer ([place])   . Coronary artery disease   . History of total knee  replacement   . Hypertension   . Hypothyroidism   . Kidney cancer, primary, with metastasis from kidney to other site [facility])   . Kidney stone   . OSA (obstructive sleep apnea)    pt does not wear cpap at night  . Pituitary cyst ([place])   . Pneumonia    hx of  . Prostate cancer [facility])     Family History  family history includes Cancer in his father and mother; Colon cancer (age of onset: 59) in his brother; Kidney cancer in his sister.  Prior Rehab/Hospitalizations:  Has the patient had major surgery during 100 days prior to admission? No  Current Medications   Current Facility-Administered Medications:  .  0.9 %  sodium chloride infusion, , Intravenous, Continuous, Sampson Goon, MD, Stopped  at 02/23/18 [phone] .  albuterol (PROVENTIL) (2.5 MG/3ML) 0.083% nebulizer solution 2.5 mg, 2.5 mg, Nebulization, Q4H PRN, Sampson Goon, MD, 2.5 mg at 02/13/18 0321 .  apixaban (ELIQUIS) tablet 5 mg, 5 mg, Oral, BID, Priscella Mann, RPH, 5 mg at 02/25/18 9604 .  bacitracin-polymyxin b (POLYSPORIN) ophthalmic ointment, , Left Eye, QHS, Sampson Goon, MD .  budesonide (PULMICORT) nebulizer solution 0.5 mg, 0.5 mg, Nebulization, BID, Jennelle Human B, NP, 0.5 mg at 02/24/18 2025 .  chlorhexidine (PERIDEX) 0.12 % solution 15 mL, 15 mL, Mouth Rinse, BID, Sampson Goon, MD, 15 mL at 02/25/18 5409 .  Chlorhexidine Gluconate Cloth 2 % PADS 6 each, 6 each, Topical, Daily, Sampson Goon, MD, 6 each at 02/24/18 1430 .  docusate sodium (COLACE) capsule 100 mg, 100 mg, Oral, Daily, Icard, Bradley L, DO, 100 mg at 02/25/18 0927 .  escitalopram (LEXAPRO) tablet 20 mg, 20 mg, Oral, Daily, Icard, Bradley L, DO, 20 mg at 02/25/18 0928 .  feeding supplement (ENSURE ENLIVE) (ENSURE ENLIVE) liquid 237 mL, 237 mL, Oral, TID BM, Arrien, Jimmy Picket, MD, 237 mL at 02/24/18 2308 .  ipratropium-albuterol (DUONEB) 0.5-2.5 (3) MG/3ML nebulizer solution 3 mL, 3 mL, Nebulization, Q4H PRN, Jennelle Human B, NP .  levothyroxine (SYNTHROID, LEVOTHROID) tablet 150 mcg, 150 mcg, Oral, QAC breakfast, Icard, Bradley L, DO, 150 mcg at 02/25/18 0927 .  MEDLINE mouth rinse, 15 mL, Mouth Rinse, q12n4p, Sampson Goon, MD, 15 mL at 02/24/18 1430 .  multivitamin with minerals tablet 1 tablet, 1 tablet, Oral, Daily, Icard, Bradley L, DO, 1 tablet at 02/25/18 0928 .  nortriptyline (PAMELOR) capsule 75 mg, 75 mg, Oral, QHS, Icard, Bradley L, DO, 75 mg at 02/24/18 2309 .  polyvinyl alcohol (LIQUIFILM TEARS) 1.4 % ophthalmic solution 1 drop, 1 drop, Both Eyes, PRN, Roney Jaffe, MD, 1 drop at 02/18/18 2258 .  senna (SENOKOT) tablet 8.6 mg, 1 tablet, Oral, Daily, Seawell, Jaimie A, DO, 8.6 mg at 02/25/18 0928 .  sodium chloride flush  (NS) 0.9 % injection 10-40 mL, 10-40 mL, Intracatheter, Q12H, Sampson Goon, MD, 10 mL at 02/25/18 0930 .  sodium chloride flush (NS) 0.9 % injection 10-40 mL, 10-40 mL, Intracatheter, PRN, Sampson Goon, MD, 10 mL at 02/13/18 1001  Patients Current Diet:  Diet Order            DIET DYS 2 Room service appropriate? 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 None  Prior Functional Level Prior Function Level of Independence: Independent  Self Care: Did the patient need help bathing, dressing, using the toilet or eating?  Independent  Indoor Mobility: Did the patient need assistance with walking from room to room (with or without device)? Independent  Stairs: Did the patient need assistance with internal or external stairs (with or without device)? Independent  Functional Cognition: Did the patient need help planning regular tasks such as shopping or remembering to take medications? Independent  Current Functional Level Cognition  Overall Cognitive Status: Impaired/Different from baseline Orientation Level: Oriented X4 Safety/Judgement: Decreased awareness of safety, Decreased awareness of deficits General Comments: decreased memory of prior sessions, however WFL for all tasks assessed    Extremity Assessment (includes Sensation/Coordination)  Upper Extremity Assessment: Generalized weakness  Lower Extremity  Assessment: Defer to PT evaluation LLE Deficits / Details: edema and redness L LE    ADLs  Overall ADL's : Needs assistance/impaired Eating/Feeding: Independent Grooming: Wash/dry hands, Sitting, Modified independent Upper Body Bathing: Sitting, Set up Lower Body Bathing: Moderate assistance, Sitting/lateral leans Upper Body Dressing : Set up, Sitting Lower Body Dressing: Maximal assistance, Sit to/from stand Toilet Transfer: Maximal assistance, +2 for physical assistance, +2 for safety/equipment, Stand-pivot, RW, Requires wide/bariatric Toileting- Clothing Manipulation and Hygiene: Total assistance, +2 for physical assistance, +2 for safety/equipment Functional mobility during ADLs: Total assistance, +2 for physical assistance, +2 for safety/equipment, Rolling walker    Mobility  Overal bed mobility: Needs Assistance Bed Mobility: Supine to Sit, Sit to Supine Supine to sit: Max assist, HOB elevated Sit to supine: Total assist, +2 for physical assistance General bed mobility comments: pt up in recliner    Transfers  Overall transfer level: Needs assistance Equipment used: Rolling walker (2 wheeled) Transfer via Lift Equipment: Stedy Transfers: Sit to/from Stand Sit to Stand: +2 physical assistance, Max assist, Hydrographic surveyor transfer comment: max Ax2 for initial power up from lower recliner, minA for 4x power up from Beatrice pads to standing, pt able to stand for a max of 30 sec     Ambulation / Gait / Stairs / Wheelchair Mobility  Ambulation/Gait Ambulation/Gait assistance: Herbalist (Feet): 40 Feet Assistive device: Rolling walker (2 wheeled) Gait Pattern/deviations: Step-through pattern, Shuffle, Trunk flexed General Gait Details: min A for steadying, pt with decreased foot clearance with progress, vc for upright posture and procimity to RW Gait velocity: slowed Gait velocity interpretation: <1.31 ft/sec, indicative of household ambulator    Posture /  Balance Dynamic Sitting Balance Sitting balance - Comments: once feet on floor and given time to acclimate to sitting able to sit with min guard for balance Balance Overall balance assessment: Needs assistance Sitting-balance support: Feet supported, No upper extremity supported Sitting balance-Leahy Scale: Fair Sitting balance - Comments: once feet on floor and given time to acclimate to sitting able to sit with min guard for balance Standing balance support: Bilateral upper extremity supported Standing balance-Leahy Scale: Fair Standing balance comment: able to static stand for 30 sec without support    Special needs/care consideration BiPAP/CPAP: uses CPAP at home per his report CPM: no Continuous Drip IV: no Dialysis: no  Days: no Life Vest: no Oxygen: no Special Bed: no Trach Size: no Wound Vac (area): no      Location: no Skin: generalized cyanosis; ecchymosis Bil arms, weeping along groin                              Bowel mgmt:last BM: 02/25/18; continent Bladder mgmt: continent, use of urinal Diabetic mgmt: No     Previous Home Environment Living Arrangements: Spouse/significant other Available Help at Discharge: Family, Available 24 hours/day Type of Home: House Home Layout: Two level, Able to live on main level with bedroom/bathroom Home Access: Level entry Bathroom Shower/Tub: Multimedia programmer: Standard Bathroom Accessibility: Yes How Accessible: Accessible via walker [place]: No  Discharge Living Setting Plans for Discharge Living Setting: Patient's home, Lives with (comment)(wife) Type of Home at Discharge: House Discharge Home Layout: Two level, Able to live on main level with bedroom/bathroom Alternate Level Stairs-Rails: None(unknown) Alternate Level Stairs-Number of Steps: (pt does not need to go upstairs per his report) Discharge Home Access: Level entry Discharge Bathroom Shower/Tub: Walk-in shower Discharge Bathroom  Toilet: Handicapped height Discharge Bathroom Accessibility: Yes How Accessible: Accessible via walker Does the patient have any problems obtaining your medications?: No  Social/Family/Support Systems Patient Roles: Spouse Contact Information: emergency contact is wife: [name] Anticipated Caregiver: [name] (cell: [phone]; (home): [phone] Anticipated Caregiver's Contact Information: see above Ability/Limitations of Caregiver: retired, home 24/7 with husband; can provide assist as needed Caregiver Availability: 24/7 Discharge Plan Discussed with Primary Caregiver: Yes Is Caregiver In Agreement with Plan?: Yes Does Caregiver/Family have Issues with Lodging/Transportation while Pt is in Rehab?: No   Goals/Additional Needs Patient/Family Goal for Rehab: PT/OT: Mod I/Supervision; SLP: NA Expected length of stay: 14-18 days Cultural Considerations: [employer]; enjoys fishing Dietary Needs: DYS 2; thin liquids Equipment Needs: TBD Pt/Family Agrees to Admission and willing to participate: Yes Program Orientation Provided & Reviewed with Pt/Caregiver Including Roles  & Responsibilities: Yes(reviewed with pt and wife)  Barriers to Discharge: Other (comments)(no barriers identified )   Decrease burden of Care through IP rehab admission: NA  Possible need for SNF placement upon discharge: Not anticipated; pt has good prognosis for further progress with good social support at home.    Patient Condition: This patient's condition remains as documented in the consult dated 8/29, in which the Rehabilitation Physician determined and documented that the patient's condition is appropriate for intensive rehabilitative care in an inpatient rehabilitation facility. Will admit to inpatient rehab on 02/25/18  Preadmission Screen Completed By:  Jhonnie Garner, 02/25/2018 11:13 AM ______________________________________________________________________   Discussed status with Dr. Naaman Plummer on 02/26/18  at  7:30AM and received telephone approval for admission today.  Admission Coordinator:  Jhonnie Garner, time 7:30AM Sudie Grumbling 02/26/18.

## 2018-02-25 NOTE — H&P (Signed)
Physical Medicine and Rehabilitation Admission H&P    Chief Complaint  Patient presents with  . Shortness of Breath  : HPI: Wayne Booth is a 77 year old right-handed male with history of atrial fibrillation status post ablation maintained on Eliquis, sleep apnea, COPD, CKD stage III, history of pulmonary emboli, hypertension.  Per chart review patient lives with spouse and independent prior to admission with occasional use of a cane.  Two-level home with bedroom on [address].  Presented 02/03/2018 with increasing shortness of breath and decreased mobility over a 5-day span as well as associated right-sided pleuritic chest pain.  Chest x-ray showed right lung mid lung zone consolidation concerning for pneumonia.  Perfusion scan low probability for pulmonary emboli.  CT of the chest showed bilateral multilobar pneumonia involving the right upper, middle and both lower lobes.  Mild hyponatremia 131, troponin 0.12, lactic acid 1.51.  Placed on broad-spectrum antibiotics for suspect sepsis.  Echocardiogram with ejection fraction of 55% no wall motion abnormalities.  Patient did require intubation for acute respiratory failure 02/12/2018 until 02/17/2018.  Persistent leukocytosis 23,600 improved until 19,500 infectious disease consulted antibiotic course completed advised to continue to monitor. Recent blood cultures negative. MRSA PCR screening positive with contact precautions.  Therapy evaluations completed with recommendations of physical medicine rehab consult.  Patient was admitted for a comprehensive rehab program.  Review of Systems  Constitutional: Negative for chills and fever.  HENT: Negative for hearing loss.   Eyes: Negative for blurred vision and double vision.  Respiratory: Positive for cough and shortness of breath.   Cardiovascular: Positive for palpitations and leg swelling.  Gastrointestinal: Positive for constipation. Negative for nausea and vomiting.  Genitourinary: Positive for  urgency.  Musculoskeletal: Positive for joint pain and myalgias.  Skin: Negative for rash.  All other systems reviewed and are negative.  Past Medical History:  Diagnosis Date  . Allergy    States his nose runs when he is around grease.   Marland Kitchen Anxiety   . Arthritis   . Atrial fibrillation ([place])   . Cancer ([place])   . Coronary artery disease   . History of total knee replacement   . Hypertension   . Hypothyroidism   . Kidney cancer, primary, with metastasis from kidney to other site [facility])   . Kidney stone   . OSA (obstructive sleep apnea)    pt does not wear cpap at night  . Pituitary cyst ([place])   . Pneumonia    hx of  . Prostate cancer [facility])    Past Surgical History:  Procedure Laterality Date  . ABLATION OF DYSRHYTHMIC FOCUS  01/28/2018  . ANTERIOR CERVICAL DECOMP/DISCECTOMY FUSION N/A 08/29/2012   Procedure: ANTERIOR CERVICAL DECOMPRESSION/DISCECTOMY FUSION 1 LEVEL;  Surgeon: Eustace Moore, MD;  Location: [facility];  Service: Neurosurgery;  Laterality: N/A;  . APPENDECTOMY    . ATRIAL FIBRILLATION ABLATION N/A 01/28/2018   Procedure: ATRIAL FIBRILLATION ABLATION;  Surgeon: Constance Haw, MD;  Location: [facility] CV LAB;  Service: Cardiovascular;  Laterality: N/A;  . CARDIAC CATHETERIZATION  2008   clean  . CHOLECYSTECTOMY    . COLONOSCOPY W/ POLYPECTOMY    . CRANIOTOMY N/A 11/08/2012   Procedure: CRANIOTOMY HYPOPHYSECTOMY TRANSNASAL APPROACH;  Surgeon: Faythe Ghee, MD;  Location: [place] [facility];  Service: Neurosurgery;  Laterality: N/A;  Transphenoidal Resection of Pituitary Tumor  . FINGER SURGERY Left 2017   Dr Amedeo Plenty  . INSERTION PROSTATE RADIATION SEED    . KIDNEY STONE SURGERY    .  KNEE ARTHROSCOPY  2007   left  . NEPHRECTOMY Left   . POSTERIOR CERVICAL LAMINECTOMY Left 04/24/2015   Procedure: Foraminotomy cervical five - cervical six cervical six - cervical seven left;  Surgeon: Eustace Moore, MD;  Location: [facility];  Service: Neurosurgery;   Laterality: Left;  . REPLACEMENT TOTAL KNEE BILATERAL     Family History  Problem Relation Age of Onset  . Cancer Father        ?mouth, died before patient born  . Cancer Mother        stomach, died when he was young  . Kidney cancer Sister   . Colon cancer Brother 91  . Esophageal cancer Neg Hx   . Rectal cancer Neg Hx   . Stomach cancer Neg Hx    Social History:  reports that he has quit smoking. His smoking use included cigars. He quit after 4.00 years of use. He has never used smokeless tobacco. He reports that he drinks about 2.0 standard drinks of alcohol per week. He reports that he does not use drugs. Allergies:  Allergies  Allergen Reactions  . Ambien [Zolpidem] Anxiety    Anxiety and agitation when tried as sleeper in hospital aug 2019   Medications Prior to Admission  Medication Sig Dispense Refill  . albuterol (PROVENTIL HFA;VENTOLIN HFA) 108 (90 Base) MCG/ACT inhaler Inhale 2 puffs into the lungs every 6 (six) hours as needed for wheezing or shortness of breath. 1 Inhaler 5  . allopurinol (ZYLOPRIM) 300 MG tablet Take 0.5 tablets (150 mg total) by mouth daily. 30 tablet 3  . amiodarone (PACERONE) 200 MG tablet Take 1 tablet (200 mg total) by mouth daily. 90 tablet 3  . apixaban (ELIQUIS) 5 MG TABS tablet Take 1 tablet (5 mg total) by mouth 2 (two) times daily. 60 tablet 1  . budesonide-formoterol (SYMBICORT) 160-4.5 MCG/ACT inhaler Inhale 2 puffs into the lungs 2 (two) times daily as needed (for respiratory flares).    . clotrimazole-betamethasone (LOTRISONE) cream Apply 1 application topically 2 (two) times daily. Use for 10 days maximum. (Patient taking differently: Apply 1 application topically 2 (two) times daily as needed (for rashes). ) 45 g 1  . colchicine 0.6 MG tablet Take 1 tablet (0.6 mg total) by mouth daily. 30 tablet 3  . cyclobenzaprine (FLEXERIL) 10 MG tablet Take 10 mg by mouth daily as needed for muscle spasms.     Marland Kitchen diltiazem (CARDIZEM CD) 240 MG 24 hr  capsule Take 1 capsule (240 mg total) by mouth daily. 30 capsule 3  . escitalopram (LEXAPRO) 20 MG tablet TAKE ONE TABLET BY MOUTH DAILY 90 tablet 3  . furosemide (LASIX) 20 MG tablet Take 1 tablet (20 mg total) by mouth daily. 30 tablet 3  . levothyroxine (SYNTHROID, LEVOTHROID) 150 MCG tablet TAKE ONE TABLET BY MOUTH DAILY 90 tablet 2  . metoprolol tartrate (LOPRESSOR) 25 MG tablet Take 0.5 tablets (12.5 mg total) by mouth 2 (two) times daily. 60 tablet 3  . Multiple Vitamins-Minerals (MULTIVITAMINS THER. W/MINERALS) TABS Take 1 tablet by mouth daily.    . nitroGLYCERIN (NITROSTAT) 0.4 MG SL tablet Place 1 tablet (0.4 mg total) under the tongue every 5 (five) minutes as needed for chest pain. 30 tablet 12  . nortriptyline (PAMELOR) 75 MG capsule TAKE ONE CAPSULE BY MOUTH AT BEDTIME 90 capsule 1  . Suvorexant (BELSOMRA) 10 MG TABS Take 10 mg by mouth at bedtime. 30 tablet 5    Drug Regimen Review Drug regimen  was reviewed and remains appropriate with no significant issues identified  Home: Home Living Family/patient expects to be discharged to:: Private residence Living Arrangements: Spouse/significant other Available Help at Discharge: Family, Available 24 hours/day Type of Home: House Home Access: Level entry Home Layout: Two level, Able to live on main level with bedroom/bathroom Bathroom Shower/Tub: Multimedia programmer: Standard Bathroom Accessibility: Yes Home Equipment: [facility] [facility] - single point   Functional History: Prior Function Level of Independence: Independent  Functional Status:  Mobility: Bed Mobility Overal bed mobility: Needs Assistance Bed Mobility: Supine to Sit, Sit to Supine Supine to sit: Max assist, HOB elevated Sit to supine: Total assist, +2 for physical assistance General bed mobility comments: pt up in recliner Transfers Overall transfer level: Needs assistance Equipment used: Rolling walker (2 wheeled) Transfer via Lift Equipment:  Stedy Transfers: Sit to/from Stand Sit to Stand: Max assist, +2 physical assistance General transfer comment: max lifting A and vc for body mechanics Ambulation/Gait Ambulation/Gait assistance: Herbalist (Feet): 40 Feet Assistive device: Rolling walker (2 wheeled) Gait Pattern/deviations: Step-through pattern, Shuffle, Trunk flexed General Gait Details: min A for steadying, pt with decreased foot clearance with progress, vc for upright posture and procimity to RW Gait velocity: slowed Gait velocity interpretation: <1.31 ft/sec, indicative of household ambulator    ADL: ADL Overall ADL's : Needs assistance/impaired Eating/Feeding: Independent Grooming: Wash/dry hands, Sitting, Modified independent Upper Body Bathing: Sitting, Set up Lower Body Bathing: Moderate assistance, Sitting/lateral leans Upper Body Dressing : Set up, Sitting Lower Body Dressing: Maximal assistance, Sit to/from stand Toilet Transfer: Maximal assistance, +2 for physical assistance, +2 for safety/equipment, Stand-pivot, RW, Requires wide/bariatric Toileting- Clothing Manipulation and Hygiene: Total assistance, +2 for physical assistance, +2 for safety/equipment Functional mobility during ADLs: Total assistance, +2 for physical assistance, +2 for safety/equipment, Rolling walker  Cognition: Cognition Overall Cognitive Status: Impaired/Different from baseline Orientation Level: Oriented X4 Cognition Arousal/Alertness: Awake/alert Behavior During Therapy: WFL for tasks assessed/performed Overall Cognitive Status: Impaired/Different from baseline Area of Impairment: Problem solving, Safety/judgement Safety/Judgement: Decreased awareness of safety, Decreased awareness of deficits Problem Solving: Slow processing General Comments: Pt AOx4 and despite some slow processing, WFL for all tasks assessed  Physical Exam: Blood pressure 130/87, pulse 85, temperature 97.7 F (36.5 C), temperature source  Oral, resp. rate (!) 22, height '5\' 10"'  (1.778 m), weight 122.5 kg, SpO2 97 %. Physical Exam  Vitals reviewed. Constitutional: He is oriented to person, place, and time. He appears well-developed.  obese  HENT:  Head: Normocephalic.  Eyes: EOM are normal. Right eye exhibits no discharge. Left eye exhibits no discharge.  Neck: Normal range of motion. Neck supple. No thyromegaly present.  Cardiovascular: Normal rate. Exam reveals no gallop and no friction rub.  No murmur heard.  IRR IRR  Respiratory: Effort normal. No respiratory distress. He has no wheezes. He has no rales.  A few scattered rhonchi, breath sounds decreased at bases.  GI: Soft. Bowel sounds are normal. He exhibits no distension. There is no tenderness.  Musculoskeletal: He exhibits edema (trace LE edema).  Neurological: He is alert and oriented to person, place, and time. No cranial nerve deficit. Coordination normal.  UE 4- deltoid, 4/5 Biceps and triceps, 5/5 Grip/wrist. LE: 3+/5 HF, KE and 4/5 ADF/PF.   Skin: Skin is warm and dry.  Psychiatric: He has a normal mood and affect. His behavior is normal. Judgment and thought content normal.    Results for orders placed or performed during the hospital encounter of 02/02/18 (from  the past 48 hour(s))  Glucose, capillary     Status: Abnormal   Collection Time: 02/23/18  7:40 AM  Result Value Ref Range   Glucose-Capillary 132 (H) 70 - 99 mg/dL   Comment 1 Capillary Specimen   CBC     Status: Abnormal   Collection Time: 02/24/18  3:20 AM  Result Value Ref Range   WBC 20.8 (H) 4.0 - 10.5 K/uL   RBC 5.14 4.22 - 5.81 MIL/uL   Hemoglobin 15.2 13.0 - 17.0 g/dL   HCT 46.9 39.0 - 52.0 %   MCV 91.2 78.0 - 100.0 fL   MCH 29.6 26.0 - 34.0 pg   MCHC 32.4 30.0 - 36.0 g/dL   RDW 15.4 11.5 - 15.5 %   Platelets 330 150 - 400 K/uL    Comment: Performed at [facility], [place] [address], [place], [place] [address]  Comprehensive metabolic panel     Status: Abnormal   Collection  Time: 02/24/18  3:20 AM  Result Value Ref Range   Sodium 139 135 - 145 mmol/L   Potassium 4.0 3.5 - 5.1 mmol/L   Chloride 103 98 - 111 mmol/L   CO2 23 22 - 32 mmol/L   Glucose, Bld 81 70 - 99 mg/dL   BUN 53 (H) 8 - 23 mg/dL   Creatinine, Ser 1.71 (H) 0.61 - 1.24 mg/dL   Calcium 8.9 8.9 - 10.3 mg/dL   Total Protein 6.7 6.5 - 8.1 g/dL   Albumin 2.6 (L) 3.5 - 5.0 g/dL   AST 61 (H) 15 - 41 U/L   ALT 119 (H) 0 - 44 U/L   Alkaline Phosphatase 93 38 - 126 U/L   Total Bilirubin 1.0 0.3 - 1.2 mg/dL   GFR calc non Af Amer 37 (L) >60 mL/min   GFR calc Af Amer 43 (L) >60 mL/min    Comment: (NOTE) The eGFR has been calculated using the CKD EPI equation. This calculation has not been validated in all clinical situations. eGFR's persistently <60 mL/min signify possible Chronic Kidney Disease.    Anion gap 13 5 - 15    Comment: Performed at [facility] [address], [place], [place] [address]  Glucose, capillary     Status: None   Collection Time: 02/24/18  6:21 AM  Result Value Ref Range   Glucose-Capillary 98 70 - 99 mg/dL  CBC with Differential/Platelet     Status: Abnormal   Collection Time: 02/25/18  1:58 AM  Result Value Ref Range   WBC 19.5 (H) 4.0 - 10.5 K/uL   RBC 4.87 4.22 - 5.81 MIL/uL   Hemoglobin 14.2 13.0 - 17.0 g/dL   HCT 44.5 39.0 - 52.0 %   MCV 91.4 78.0 - 100.0 fL   MCH 29.2 26.0 - 34.0 pg   MCHC 31.9 30.0 - 36.0 g/dL   RDW 15.2 11.5 - 15.5 %   Platelets 344 150 - 400 K/uL   Neutrophils Relative % 76 %   Neutro Abs 14.9 (H) 1.7 - 7.7 K/uL   Lymphocytes Relative 9 %   Lymphs Abs 1.8 0.7 - 4.0 K/uL   Monocytes Relative 7 %   Monocytes Absolute 1.5 (H) 0.1 - 1.0 K/uL   Eosinophils Relative 5 %   Eosinophils Absolute 0.9 (H) 0.0 - 0.7 K/uL   Basophils Relative 1 %   Basophils Absolute 0.1 0.0 - 0.1 K/uL   Immature Granulocytes 2 %   Abs Immature Granulocytes 0.3 (H) 0.0 -  0.1 K/uL    Comment: Performed at [facility], [place] [address]  [place], [place] [address]  Basic metabolic panel     Status: Abnormal   Collection Time: 02/25/18  1:58 AM  Result Value Ref Range   Sodium 135 135 - 145 mmol/L   Potassium 4.0 3.5 - 5.1 mmol/L   Chloride 99 98 - 111 mmol/L   CO2 26 22 - 32 mmol/L   Glucose, Bld 165 (H) 70 - 99 mg/dL   BUN 39 (H) 8 - 23 mg/dL   Creatinine, Ser 1.64 (H) 0.61 - 1.24 mg/dL   Calcium 9.0 8.9 - 10.3 mg/dL   GFR calc non Af Amer 39 (L) >60 mL/min   GFR calc Af Amer 46 (L) >60 mL/min    Comment: (NOTE) The eGFR has been calculated using the CKD EPI equation. This calculation has not been validated in all clinical situations. eGFR's persistently <60 mL/min signify possible Chronic Kidney Disease.    Anion gap 10 5 - 15    Comment: Performed at [facility] [address], [place], [place] [address]   No results found.     Medical Problem List and Plan: 1.  Debility secondary to pneumonia/respiratory failure with resolving sepsis.    -admit to inpatient rehab     2.  DVT Prophylaxis/Anticoagulation: Chronic Eliquis 3. Pain Management: Tylenol as needed 4. Mood: Lexapro 20 mg daily, Pamelor 75 mg nightly 5. Neuropsych: This patient is capable of making decisions on his own behalf. 6. Skin/Wound Care: Routine skin checks 7. Fluids/Electrolytes/Nutrition: Routine in and outs with follow-up chemistries 8.  Atrial fibrillation status post ablation.  Continue Eliquis.  Cardiac rate controlled 9.  CKD stage III. Monitor labs, I's and O's 10.  MRSA PCR screening positive.  Contact precautions 11.  Leukocytosis.  Improving.  Antibiotic therapy completed.Recent blood cultures negative.  Follow-up infectious disease as needed 12.  Hypothyroidism.  Synthroid 13.  Obesity.  BMI 38.75.  Dietary follow-up   Post Admission Physician Evaluation: 1. Functional deficits secondary  to debility. 2. Patient is admitted to receive collaborative, interdisciplinary care between the physiatrist, rehab nursing staff,  and therapy team. 3. Patient's level of medical complexity and substantial therapy needs in context of that medical necessity cannot be provided at a lesser intensity of care such as a SNF. 4. Patient has experienced substantial functional loss from his/her baseline which was documented above under the "Functional History" and "Functional Status" headings.  Judging by the patient's diagnosis, physical exam, and functional history, the patient has potential for functional progress which will result in measurable gains while on inpatient rehab.  These gains will be of substantial and practical use upon discharge  in facilitating mobility and self-care at the household level. 5. Physiatrist will provide 24 hour management of medical needs as well as oversight of the therapy plan/treatment and provide guidance as appropriate regarding the interaction of the two. 6. The Preadmission Screening has been reviewed and patient status is unchanged unless otherwise stated above. 7. 24 hour rehab nursing will assist with bladder management, bowel management, safety, skin/wound care, disease management, medication administration, pain management and patient education  and help integrate therapy concepts, techniques,education, etc. 8. PT will assess and treat for/with: Lower extremity strength, range of motion, stamina, balance, functional mobility, safety, adaptive techniques and equipment, activity tolerance, family ed.   Goals are: mod I . 9. OT will assess and treat for/with: ADL's, functional mobility, safety, upper extremity strength,  adaptive techniques and equipment, activity tolerance, family ed.   Goals are: mod I to supervision. Therapy may proceed with showering this patient. 10. SLP will assess and treat for/with: n/a.  Goals are: n/a. 11. Case Management and Social Worker will assess and treat for psychological issues and discharge planning. 12. Team conference will be held weekly to assess progress toward  goals and to determine barriers to discharge. 13. Patient will receive at least 3 hours of therapy per day at least 5 days per week. 14. ELOS: 14-18 days       15. Prognosis:  excellent   I have personally performed a face to face diagnostic evaluation of this patient and formulated the key components of the plan.  Additionally, I have personally reviewed laboratory data, imaging studies, as well as relevant notes and concur with the physician assistant's documentation above.  Meredith Staggers, MD, FAAPMR    Lavon Paganini [place], PA-C 02/25/2018

## 2018-02-25 NOTE — Progress Notes (Signed)
Physical Therapy Treatment Patient Details Name: Wayne Booth MRN: [id number] DOB: [birth year] Today's Date: 02/25/2018    History of Present Illness 77 y.o. male with morbid obesity, atrial fibrillation s/p recent ablation, OSA noncompliant with CPAP, hypothyroidism, prior PE, renal and prostate cancer s/p nephrectomy here with multilobar pneumonia and acute on chronic diastolic heart failure. Transfered to ICU and intubated 8/17-8/22 when he was extubated, failed BiPAP use and was reintubated.  Extubated on 8/26. PT reordered.    PT Comments    Pt is always eager to participate in therapy and continues to make good progress towards his goals, however continues to be limited in safe mobility by decreased strength and endurance. Pt required maxAx2 for power up from low recliner to [facility], requires 2x attempts due to decreased forward lean on first attempt to power up. Once up to [facility] pads pt practiced 4x sit<>stand with maximum static stand of 30 sec with hands on Stedy. Pt lives with very supportive family that will be able to support him appropriately after CIR stay. This paired with his strong work ethic make him a perfect candidate for CIR level rehab at d/c.   Follow Up Recommendations  CIR     Equipment Recommendations  Other (comment)(TBD at next venue)    Recommendations for Other Services       Precautions / Restrictions Precautions Precautions: Fall Precaution Comments: O2 dependent; watch SpO2 Restrictions Weight Bearing Restrictions: No    Mobility  Bed Mobility               General bed mobility comments: pt up in recliner  Transfers Overall transfer level: Needs assistance Equipment used: Rolling walker (2 wheeled) Transfers: Sit to/from Stand Sit to Stand: +2 physical assistance;Max assist;Min assist         General transfer comment: max Ax2 for initial power up from lower recliner, minA for 4x power up from Stedy pads to standing, pt able to stand  for a max of 30 sec       Balance Overall balance assessment: Needs assistance Sitting-balance support: Feet supported;No upper extremity supported Sitting balance-Leahy Scale: Fair     Standing balance support: Bilateral upper extremity supported Standing balance-Leahy Scale: Fair Standing balance comment: able to static stand for 30 sec without support                            Cognition Arousal/Alertness: Awake/alert Behavior During Therapy: WFL for tasks assessed/performed Overall Cognitive Status: Impaired/Different from baseline                               Problem Solving: Slow processing General Comments: decreased memory of prior sessions, however WFL for all tasks assessed         General Comments General comments (skin integrity, edema, etc.): SaO2 on RA >90%O2 throughout session      Pertinent Vitals/Pain Pain Assessment: No/denies pain    Home Living Family/patient expects to be discharged to:: Private residence Living Arrangements: Spouse/significant other Available Help at Discharge: Family;Available 24 hours/day Type of Home: House Home Access: Level entry   Home Layout: Two level;Able to live on main level with bedroom/bathroom Home Equipment: Kasandra Knudsen - single point      Prior Function Level of Independence: Independent          PT Goals (current goals can now be found in the care plan section) Acute  Rehab PT Goals Patient Stated Goal: to return to independent PT Goal Formulation: With patient/family Time For Goal Achievement: 03/08/18 Potential to Achieve Goals: Fair Progress towards PT goals: Progressing toward goals    Frequency    Min 3X/week      PT Plan Discharge plan needs to be updated    Co-evaluation              AM-PAC PT "6 Clicks" Daily Activity  Outcome Measure  Difficulty turning over in bed (including adjusting bedclothes, sheets and blankets)?: Unable Difficulty moving from lying on  back to sitting on the side of the bed? : Unable Difficulty sitting down on and standing up from a chair with arms (e.g., wheelchair, bedside commode, etc,.)?: Unable Help needed moving to and from a bed to chair (including a wheelchair)?: Total Help needed walking in hospital room?: Total Help needed climbing 3-5 steps with a railing? : Total 6 Click Score: 6    End of Session Equipment Utilized During Treatment: Gait belt Activity Tolerance: Patient limited by fatigue Patient left: in chair;with call bell/phone within reach;with chair alarm set;with family/visitor present Nurse Communication: Mobility status PT Visit Diagnosis: Other abnormalities of gait and mobility (R26.89);Muscle weakness (generalized) (M62.81)     Time: 8242-3536 PT Time Calculation (min) (ACUTE ONLY): 17 min  Charges:  $Therapeutic Activity: 8-22 mins                     Edia Pursifull B. Migdalia Dk PT, DPT Acute Rehabilitation Services Pager [phone] Office [phone]    [name] 02/25/2018, 10:48 AM

## 2018-02-26 ENCOUNTER — Encounter (HOSPITAL_COMMUNITY): Payer: Self-pay

## 2018-02-26 ENCOUNTER — Inpatient Hospital Stay (HOSPITAL_COMMUNITY)
Admit: 2018-02-26 | Discharge: 2018-03-05 | DRG: 945 | Disposition: A | Discharge status: Home Health | Payer: Medicare Other | Source: Intra-hospital | Attending: Physical Medicine & Rehabilitation | Admitting: Physical Medicine & Rehabilitation

## 2018-02-26 DIAGNOSIS — N183 Chronic kidney disease, stage 3 unspecified: Secondary | ICD-10-CM

## 2018-02-26 DIAGNOSIS — G4730 Sleep apnea, unspecified: Secondary | ICD-10-CM | POA: Diagnosis present

## 2018-02-26 DIAGNOSIS — Z981 Arthrodesis status: Secondary | ICD-10-CM

## 2018-02-26 DIAGNOSIS — I129 Hypertensive chronic kidney disease with stage 1 through stage 4 chronic kidney disease, or unspecified chronic kidney disease: Secondary | ICD-10-CM | POA: Diagnosis present

## 2018-02-26 DIAGNOSIS — M109 Gout, unspecified: Secondary | ICD-10-CM | POA: Diagnosis present

## 2018-02-26 DIAGNOSIS — Z6838 Body mass index (BMI) 38.0-38.9, adult: Secondary | ICD-10-CM | POA: Diagnosis not present

## 2018-02-26 DIAGNOSIS — E871 Hypo-osmolality and hyponatremia: Secondary | ICD-10-CM | POA: Diagnosis present

## 2018-02-26 DIAGNOSIS — F329 Major depressive disorder, single episode, unspecified: Secondary | ICD-10-CM | POA: Diagnosis present

## 2018-02-26 DIAGNOSIS — J181 Lobar pneumonia, unspecified organism: Secondary | ICD-10-CM | POA: Diagnosis present

## 2018-02-26 DIAGNOSIS — F419 Anxiety disorder, unspecified: Secondary | ICD-10-CM | POA: Diagnosis present

## 2018-02-26 DIAGNOSIS — T148XXA Other injury of unspecified body region, initial encounter: Secondary | ICD-10-CM

## 2018-02-26 DIAGNOSIS — N179 Acute kidney failure, unspecified: Secondary | ICD-10-CM

## 2018-02-26 DIAGNOSIS — J440 Chronic obstructive pulmonary disease with acute lower respiratory infection: Secondary | ICD-10-CM | POA: Diagnosis present

## 2018-02-26 DIAGNOSIS — I4891 Unspecified atrial fibrillation: Secondary | ICD-10-CM | POA: Diagnosis not present

## 2018-02-26 DIAGNOSIS — Z87442 Personal history of urinary calculi: Secondary | ICD-10-CM | POA: Diagnosis not present

## 2018-02-26 DIAGNOSIS — Z9049 Acquired absence of other specified parts of digestive tract: Secondary | ICD-10-CM

## 2018-02-26 DIAGNOSIS — M1990 Unspecified osteoarthritis, unspecified site: Secondary | ICD-10-CM | POA: Diagnosis present

## 2018-02-26 DIAGNOSIS — Z8546 Personal history of malignant neoplasm of prostate: Secondary | ICD-10-CM

## 2018-02-26 DIAGNOSIS — R739 Hyperglycemia, unspecified: Secondary | ICD-10-CM | POA: Diagnosis not present

## 2018-02-26 DIAGNOSIS — N182 Chronic kidney disease, stage 2 (mild): Secondary | ICD-10-CM

## 2018-02-26 DIAGNOSIS — D72829 Elevated white blood cell count, unspecified: Secondary | ICD-10-CM

## 2018-02-26 DIAGNOSIS — Z85528 Personal history of other malignant neoplasm of kidney: Secondary | ICD-10-CM | POA: Diagnosis not present

## 2018-02-26 DIAGNOSIS — Z79899 Other long term (current) drug therapy: Secondary | ICD-10-CM

## 2018-02-26 DIAGNOSIS — R5381 Other malaise: Principal | ICD-10-CM | POA: Diagnosis present

## 2018-02-26 DIAGNOSIS — M7989 Other specified soft tissue disorders: Secondary | ICD-10-CM | POA: Diagnosis not present

## 2018-02-26 DIAGNOSIS — A419 Sepsis, unspecified organism: Secondary | ICD-10-CM | POA: Diagnosis present

## 2018-02-26 DIAGNOSIS — Z7901 Long term (current) use of anticoagulants: Secondary | ICD-10-CM | POA: Diagnosis not present

## 2018-02-26 DIAGNOSIS — X58XXXA Exposure to other specified factors, initial encounter: Secondary | ICD-10-CM | POA: Diagnosis present

## 2018-02-26 DIAGNOSIS — Z86711 Personal history of pulmonary embolism: Secondary | ICD-10-CM

## 2018-02-26 DIAGNOSIS — Z905 Acquired absence of kidney: Secondary | ICD-10-CM

## 2018-02-26 DIAGNOSIS — R0989 Other specified symptoms and signs involving the circulatory and respiratory systems: Secondary | ICD-10-CM

## 2018-02-26 DIAGNOSIS — Z22322 Carrier or suspected carrier of Methicillin resistant Staphylococcus aureus: Secondary | ICD-10-CM

## 2018-02-26 DIAGNOSIS — Z87891 Personal history of nicotine dependence: Secondary | ICD-10-CM

## 2018-02-26 DIAGNOSIS — G4733 Obstructive sleep apnea (adult) (pediatric): Secondary | ICD-10-CM | POA: Diagnosis not present

## 2018-02-26 DIAGNOSIS — M1000 Idiopathic gout, unspecified site: Secondary | ICD-10-CM | POA: Diagnosis not present

## 2018-02-26 DIAGNOSIS — Z96653 Presence of artificial knee joint, bilateral: Secondary | ICD-10-CM | POA: Diagnosis present

## 2018-02-26 DIAGNOSIS — Z8051 Family history of malignant neoplasm of kidney: Secondary | ICD-10-CM

## 2018-02-26 DIAGNOSIS — I2510 Atherosclerotic heart disease of native coronary artery without angina pectoris: Secondary | ICD-10-CM | POA: Diagnosis not present

## 2018-02-26 DIAGNOSIS — M19072 Primary osteoarthritis, left ankle and foot: Secondary | ICD-10-CM | POA: Diagnosis not present

## 2018-02-26 DIAGNOSIS — T380X5A Adverse effect of glucocorticoids and synthetic analogues, initial encounter: Secondary | ICD-10-CM

## 2018-02-26 DIAGNOSIS — E039 Hypothyroidism, unspecified: Secondary | ICD-10-CM | POA: Diagnosis present

## 2018-02-26 DIAGNOSIS — Z800 Family history of malignant neoplasm of digestive organs: Secondary | ICD-10-CM

## 2018-02-26 DIAGNOSIS — R531 Weakness: Secondary | ICD-10-CM | POA: Diagnosis present

## 2018-02-26 DIAGNOSIS — Z79890 Hormone replacement therapy (postmenopausal): Secondary | ICD-10-CM

## 2018-02-26 MED ORDER — APIXABAN 5 MG PO TABS
5.0000 mg | ORAL_TABLET | Freq: Two times a day (BID) | ORAL | Status: DC
  Administered 2018-02-26 – 2018-03-05 (×14): 5 mg via ORAL
  Filled 2018-02-26 (×14): qty 1

## 2018-02-26 MED ORDER — LEVOTHYROXINE SODIUM 75 MCG PO TABS
150.0000 ug | ORAL_TABLET | Freq: Every day | ORAL | Status: DC
  Administered 2018-02-27 – 2018-03-05 (×7): 150 ug via ORAL
  Filled 2018-02-26 (×7): qty 2

## 2018-02-26 MED ORDER — DOCUSATE SODIUM 100 MG PO CAPS
100.0000 mg | ORAL_CAPSULE | Freq: Every day | ORAL | Status: DC
  Administered 2018-02-27 – 2018-03-05 (×7): 100 mg via ORAL
  Filled 2018-02-26 (×7): qty 1

## 2018-02-26 MED ORDER — ADULT MULTIVITAMIN W/MINERALS CH
1.0000 | ORAL_TABLET | Freq: Every day | ORAL | Status: DC
  Administered 2018-02-27 – 2018-03-05 (×7): 1 via ORAL
  Filled 2018-02-26 (×7): qty 1

## 2018-02-26 MED ORDER — SENNA 8.6 MG PO TABS: 1.0000 | ORAL_TABLET | Freq: Every day | ORAL | 0 refills | Status: DC

## 2018-02-26 MED ORDER — ALBUTEROL SULFATE (2.5 MG/3ML) 0.083% IN NEBU: 2.5000 mg | INHALATION_SOLUTION | RESPIRATORY_TRACT | Status: DC | PRN

## 2018-02-26 MED ORDER — BUDESONIDE 0.5 MG/2ML IN SUSP
0.5000 mg | Freq: Two times a day (BID) | RESPIRATORY_TRACT | Status: DC
  Administered 2018-02-26 – 2018-03-05 (×10): 0.5 mg via RESPIRATORY_TRACT
  Filled 2018-02-26 (×15): qty 2

## 2018-02-26 MED ORDER — BACITRACIN-POLYMYXIN B 500-10000 UNIT/GM OP OINT
TOPICAL_OINTMENT | Freq: Every day | OPHTHALMIC | Status: DC
  Administered 2018-02-26 – 2018-02-28 (×3): via OPHTHALMIC
  Filled 2018-02-26: qty 3.5

## 2018-02-26 MED ORDER — DOCUSATE SODIUM 100 MG PO CAPS: 100.0000 mg | ORAL_CAPSULE | Freq: Every day | ORAL | 0 refills | Status: DC

## 2018-02-26 MED ORDER — IPRATROPIUM-ALBUTEROL 0.5-2.5 (3) MG/3ML IN SOLN: 3.0000 mL | RESPIRATORY_TRACT | Status: DC | PRN

## 2018-02-26 MED ORDER — NORTRIPTYLINE HCL 25 MG PO CAPS
75.0000 mg | ORAL_CAPSULE | Freq: Every day | ORAL | Status: DC
  Administered 2018-02-26 – 2018-03-04 (×7): 75 mg via ORAL
  Filled 2018-02-26 (×7): qty 3

## 2018-02-26 MED ORDER — SENNA 8.6 MG PO TABS
1.0000 | ORAL_TABLET | Freq: Every day | ORAL | Status: DC
  Administered 2018-02-27 – 2018-03-05 (×7): 8.6 mg via ORAL
  Filled 2018-02-26 (×7): qty 1

## 2018-02-26 MED ORDER — BUDESONIDE 0.5 MG/2ML IN SUSP: 0.5000 mg | Freq: Two times a day (BID) | RESPIRATORY_TRACT | 12 refills | Status: DC

## 2018-02-26 MED ORDER — ESCITALOPRAM OXALATE 10 MG PO TABS
20.0000 mg | ORAL_TABLET | Freq: Every day | ORAL | Status: DC
  Administered 2018-02-27 – 2018-03-05 (×7): 20 mg via ORAL
  Filled 2018-02-26 (×7): qty 2

## 2018-02-26 MED ORDER — ENSURE ENLIVE PO LIQD
237.0000 mL | Freq: Three times a day (TID) | ORAL | Status: DC
  Administered 2018-02-26 – 2018-03-05 (×15): 237 mL via ORAL

## 2018-02-26 MED ORDER — POLYVINYL ALCOHOL 1.4 % OP SOLN
1.0000 [drp] | OPHTHALMIC | Status: DC | PRN
  Filled 2018-02-26: qty 15

## 2018-02-26 MED ORDER — PREDNISONE 10 MG PO TABS
10.0000 mg | ORAL_TABLET | Freq: Two times a day (BID) | ORAL | Status: AC
  Administered 2018-02-26 – 2018-03-01 (×6): 10 mg via ORAL
  Filled 2018-02-26 (×6): qty 1

## 2018-02-26 NOTE — Progress Notes (Signed)
Inpatient Rehabilitation-Admissions Coordinator   [facility] has received medical approval and insurance approval for admit to CIR today.   Please call if questions.   Jhonnie Garner, OTR/L  Rehab Admissions Coordinator  [phone] 02/26/2018 9:46 AM

## 2018-02-26 NOTE — Progress Notes (Signed)
PMR Admission Coordinator Pre-Admission Assessment  Patient: Wayne Booth is an 77 y.o., male MRN: [id number] DOB: 28-Jun-1941 Height: _0  (177.8 cm) Weight: 122.5 kg                                                                                                                                                  Insurance Information HMO: yes    PPO:      PCP:      IPA:      80/20:      OTHER:  PRIMARY: [employer] Medicare      Policy#: [id number]      Subscriber: Patient CM Name: Wayne Booth       Phone#: [phone]     Fax#: [phone] Auth received from Wayne Booth on 8/30 for admit 02/26/18. Clinical updates are due to Wayne Booth on day 7 from admit (03/04/18). Pre-Cert#: [id number]      Employer:  Benefits:  Phone #: NA     Name: [email or web address] Eff. Date: 06/29/17     Deduct: $0      Out of Pocket Max: $4,400 (Met $ [phone])      Life Max: NA CIR: $345/day for days 1-5, $0/day for days 6+      SNF: $0/day for days 1-20, $160/day for days 21-48, $0/day for days 49-100; 100 day SNF limit Outpatient: per necessity     Co-Pay: $40/visit Home Health: 100% per necessity      Co-Pay:  DME: 80%     Co-Pay: 20% Providers:   Medicaid Application Date:       Case Manager:  Disability Application Date:       Case Worker:   Emergency Contact Information         Contact Information    Name Relation Home Work Mobile   [place] Spouse [phone]     Yassir, Enis   [phone]   [place], [place]   [phone]     Current Medical History  Patient Admitting Wayne Booth after pneumonia and respiratory failure  History of Present Illness:   Wayne Booth is Booth 77 year old right-handed male with history of atrial fibrillation status post ablation maintained on Eliquis, sleep apnea, COPD, CKD stage III, history of pulmonary emboli, hypertension. Per chart review patient lives with spouse and independent prior to admission with occasional use of Booth  cane. Two-level home with bedroom on [address]. Presented 02/03/2018 with increasing shortness of breath and decreased mobility over Booth 5-day span as well as associated right-sided pleuritic chest pain. Chest x-ray showed right lung mid lung zone consolidation concerning for pneumonia. Perfusion scan low probability for pulmonary emboli. CT of the chest showed bilateral multilobar pneumonia involving the right upper, middle and both lower lobes. Mild hyponatremia 131, troponin 0.12, lactic acid 1.51. Placed on broad-spectrum antibiotics for suspect sepsis. Echocardiogram with ejection fraction  of 55% no wall motion abnormalities. Patient did require intubation for acute respiratory failure 02/12/2018 until 02/17/2018. Persistent leukocytosis 23,600 improved until 19,500infectious disease consulted antibiotic course completed advised to continue to monitor. Recent blood cultures negative.MRSA PCR screening positive with contact precautions. Therapy evaluations completed with recommendations of physical medicine rehab consult. Patient is to be admitted for Booth comprehensive rehab program on 02/26/18.  Past Medical History      Past Medical History:  Diagnosis Date  . Allergy    States his nose runs when he is around grease.   Marland Kitchen Anxiety   . Arthritis   . Atrial fibrillation ([place])   . Cancer ([place])   . Coronary artery disease   . History of total knee replacement   . Hypertension   . Hypothyroidism   . Kidney cancer, primary, with metastasis from kidney to other site [facility])   . Kidney stone   . OSA (obstructive sleep apnea)    pt does not wear cpap at night  . Pituitary cyst ([place])   . Pneumonia    hx of  . Prostate cancer [facility])     Family History  family history includes Cancer in his father and mother; Colon cancer (age of onset: 37) in his brother; Kidney cancer in his sister.  Prior Rehab/Hospitalizations:  Has the patient had major surgery during 100 days prior to  admission? No  Current Medications   Current Facility-Administered Medications:  .  0.9 %  sodium chloride infusion, , Intravenous, Continuous, Wayne Goon, Wayne Booth, Stopped at 02/23/18 [phone] .  albuterol (PROVENTIL) (2.5 MG/3ML) 0.083% nebulizer solution 2.5 mg, 2.5 mg, Nebulization, Q4H PRN, Wayne Goon, Wayne Booth, 2.5 mg at 02/13/18 0321 .  apixaban (ELIQUIS) tablet 5 mg, 5 mg, Oral, BID, Wayne Booth, Wayne Booth, 5 mg at 02/25/18 6237 .  bacitracin-polymyxin Booth (POLYSPORIN) ophthalmic ointment, , Left Eye, QHS, Wayne Goon, Wayne Booth .  budesonide (PULMICORT) nebulizer solution 0.5 mg, 0.5 mg, Nebulization, BID, Wayne Human Booth, Wayne Booth, 0.5 mg at 02/24/18 2025 .  chlorhexidine (PERIDEX) 0.12 % solution 15 mL, 15 mL, Mouth Rinse, BID, Wayne Goon, Wayne Booth, 15 mL at 02/25/18 6283 .  Chlorhexidine Gluconate Cloth 2 % PADS 6 each, 6 each, Topical, Daily, Wayne Goon, Wayne Booth, 6 each at 02/24/18 1430 .  docusate sodium (COLACE) capsule 100 mg, 100 mg, Oral, Daily, Booth, Wayne Booth, Wayne Booth, 100 mg at 02/25/18 0927 .  escitalopram (LEXAPRO) tablet 20 mg, 20 mg, Oral, Daily, Booth, Wayne Booth, Wayne Booth, 20 mg at 02/25/18 0928 .  feeding supplement (ENSURE ENLIVE) (ENSURE ENLIVE) liquid 237 mL, 237 mL, Oral, TID BM, Arrien, Wayne Picket, Wayne Booth, 237 mL at 02/24/18 2308 .  ipratropium-albuterol (DUONEB) 0.5-2.5 (3) MG/3ML nebulizer solution 3 mL, 3 mL, Nebulization, Q4H PRN, Wayne Human Booth, Wayne Booth .  levothyroxine (SYNTHROID, LEVOTHROID) tablet 150 mcg, 150 mcg, Oral, QAC breakfast, Booth, Wayne Booth, Wayne Booth, 150 mcg at 02/25/18 0927 .  MEDLINE mouth rinse, 15 mL, Mouth Rinse, q12n4p, Wayne Goon, Wayne Booth, 15 mL at 02/24/18 1430 .  multivitamin with minerals tablet 1 tablet, 1 tablet, Oral, Daily, Booth, Wayne Booth, Wayne Booth, 1 tablet at 02/25/18 0928 .  nortriptyline (PAMELOR) capsule 75 mg, 75 mg, Oral, QHS, Booth, Wayne Booth, Wayne Booth, 75 mg at 02/24/18 2309 .  polyvinyl alcohol (LIQUIFILM TEARS) 1.4 % ophthalmic solution 1 drop, 1 drop, Both Eyes,  PRN, Wayne Jaffe, Wayne Booth, 1 drop at 02/18/18 2258 .  senna (SENOKOT) tablet 8.6 mg, 1 tablet, Oral, Daily, Wayne Booth,  Wayne Booth, Wayne Booth, 8.6 mg at 02/25/18 0928 .  sodium chloride flush (NS) 0.9 % injection 10-40 mL, 10-40 mL, Intracatheter, Q12H, Wayne Goon, Wayne Booth, 10 mL at 02/25/18 0930 .  sodium chloride flush (NS) 0.9 % injection 10-40 mL, 10-40 mL, Intracatheter, PRN, Wayne Goon, Wayne Booth, 10 mL at 02/13/18 1001  Patients Current Diet:     Diet Order                  DIET DYS 2 Room service appropriate? 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 None  Prior Functional Level Prior Function Level of Independence: Independent  Self Care: Did the patient need help bathing, dressing, using the toilet or eating?  Independent  Indoor Mobility: Did the patient need assistance with walking from room to room (with or without device)? Independent  Stairs: Did the patient need assistance with internal or external stairs (with or without device)? Independent  Functional Cognition: Did the patient need help planning regular tasks such as shopping or remembering to take medications? Independent  Current Functional Level Cognition  Overall Cognitive Status: Impaired/Different from baseline Orientation Level: Oriented X4 Safety/Judgement: Decreased awareness of safety, Decreased awareness of deficits General  Comments: decreased memory of prior sessions, however WFL for all tasks assessed    Extremity Assessment (includes Sensation/Coordination)  Upper Extremity Assessment: Generalized weakness  Lower Extremity Assessment: Defer to PT evaluation LLE Deficits / Details: edema and redness Booth LE    ADLs  Overall ADL's : Needs assistance/impaired Eating/Feeding: Independent Grooming: Wash/dry hands, Sitting, Modified independent Upper Body Bathing: Sitting, Set up Lower Body Bathing: Moderate assistance, Sitting/lateral leans Upper Body Dressing : Set up, Sitting Lower Body Dressing: Maximal assistance, Sit to/from stand Toilet Transfer: Maximal assistance, +2 for physical assistance, +2 for safety/equipment, Stand-pivot, RW, Requires wide/bariatric Toileting- Clothing Manipulation and Hygiene: Total assistance, +2 for physical assistance, +2 for safety/equipment Functional mobility during ADLs: Total assistance, +2 for physical assistance, +2 for safety/equipment, Rolling walker    Mobility  Overal bed mobility: Needs Assistance Bed Mobility: Supine to Sit, Sit to Supine Supine to sit: Max assist, HOB elevated Sit to supine: Total assist, +2 for physical assistance General bed mobility comments: pt up in recliner    Transfers  Overall transfer level: Needs assistance Equipment used: Rolling walker (2 wheeled) Transfer via Lift Equipment: Stedy Transfers: Sit to/from Stand Sit to Stand: +2 physical assistance, Max assist, Hydrographic surveyor transfer comment: max Ax2 for initial power up from lower recliner, minA for 4x power up from [place] pads to standing, pt able to stand for Booth max of 30 sec     Ambulation / Gait / Stairs / Wheelchair Mobility  Ambulation/Gait Ambulation/Gait assistance: Herbalist (Feet): 40 Feet Assistive device: Rolling walker (2 wheeled) Gait Pattern/deviations: Step-through pattern, Shuffle, Trunk flexed General Gait Details: min Booth for  steadying, pt with decreased foot clearance with progress, vc for upright posture  and procimity to RW Gait velocity: slowed Gait velocity interpretation: <1.31 ft/sec, indicative of household ambulator    Posture / Balance Dynamic Sitting Balance Sitting balance - Comments: once feet on floor and given time to acclimate to sitting able to sit with min guard for balance Balance Overall balance assessment: Needs assistance Sitting-balance support: Feet supported, No upper extremity supported Sitting balance-Leahy Scale: Fair Sitting balance - Comments: once feet on floor and given time to acclimate to sitting able to sit with min guard for balance Standing balance support: Bilateral upper extremity supported Standing balance-Leahy Scale: Fair Standing balance comment: able to static stand for 30 sec without support    Special needs/care consideration BiPAP/CPAP: uses CPAP at home per his report CPM: no Continuous Drip IV: no Dialysis: no        Days: no Life Vest: no Oxygen: no Special Bed: no Trach Size: no Wound Vac (area): no      Location: no Skin: generalized cyanosis; ecchymosis Bil arms, weeping along groin                              Bowel mgmt:last BM: 02/25/18; continent Bladder mgmt: continent, use of urinal Diabetic mgmt: No     Previous Home Environment Living Arrangements: Spouse/significant other Available Help at Discharge: Family, Available 24 hours/day Type of Home: House Home Layout: Two level, Able to live on main level with bedroom/bathroom Home Access: Level entry Bathroom Shower/Tub: Multimedia programmer: Standard Bathroom Accessibility: Yes How Accessible: Accessible via walker Home Care Services: No  Discharge Living Setting Plans for Discharge Living Setting: Patient's home, Lives with (comment)(wife) Type of Home at Discharge: House Discharge Home Layout: Two level, Able to live on main level with bedroom/bathroom Alternate Level  Stairs-Rails: None(unknown) Alternate Level Stairs-Number of Steps: (pt does not need to go upstairs per his report) Discharge Home Access: Level entry Discharge Bathroom Shower/Tub: Walk-in shower Discharge Bathroom Toilet: Handicapped height Discharge Bathroom Accessibility: Yes How Accessible: Accessible via walker Does the patient have any problems obtaining your medications?: No  Social/Family/Support Systems Patient Roles: Spouse Contact Information: emergency contact is wife: [name] Anticipated Caregiver: [name] (cell: [phone]; (home): [phone] Anticipated Caregiver's Contact Information: see above Ability/Limitations of Caregiver: retired, home 24/7 with husband; can provide assist as needed Caregiver Availability: 24/7 Discharge Plan Discussed with Primary Caregiver: Yes Is Caregiver In Agreement with Plan?: Yes Does Caregiver/Family have Issues with Lodging/Transportation while Pt is in Rehab?: No   Goals/Additional Needs Patient/Family Goal for Rehab: PT/OT: Mod I/Supervision; SLP: NA Expected length of stay: 14-18 days Cultural Considerations: Methodist; enjoys fishing Dietary Needs: DYS 2; thin liquids Equipment Needs: TBD Pt/Family Agrees to Admission and willing to participate: Yes Program Orientation Provided & Reviewed with Pt/Caregiver Including Roles  & Responsibilities: Yes(reviewed with pt and wife)  Barriers to Discharge: Other (comments)(no barriers identified )   Decrease burden of Care through IP rehab admission: NA  Possible need for SNF placement upon discharge: Not anticipated; pt has good prognosis for further progress with good social support at home.    Patient Condition: This patient's condition remains as documented in the consult dated 8/29, in which the Rehabilitation Physician determined and documented that the patient's condition is appropriate for intensive rehabilitative care in an inpatient rehabilitation facility. Will  admit to inpatient rehab on 02/25/18  Preadmission Screen Completed By:  Wayne Booth, 02/25/2018 11:13 AM ______________________________________________________________________   Discussed status with Dr. Naaman Plummer on 02/26/18  at 7:30AM and received telephone approval for admission today.  Admission Coordinator:  Wayne Booth, time 7:30AM Sudie Grumbling 02/26/18.           Cosigned by: Wayne Staggers, Wayne Booth at 02/26/2018 9:42 AM  Revision History

## 2018-02-26 NOTE — Progress Notes (Signed)
Pt discharged to in-patient rehabilitation per physician order. Reviewed medications, pt./family has no additional questions or concerns. Personal belongings returned, carried out by wife and additional family members.

## 2018-02-26 NOTE — Discharge Summary (Signed)
Physician Discharge Summary  Wayne Booth YBO:[id number] DOB: Apr 10, 1941 DOA: 02/02/2018  PCP: Marin Olp, MD  Admit date: 02/02/2018 Discharge date: 02/26/2018  Time spent: 45 minutes  Recommendations for Outpatient Follow-up:  -Will be discharged to [facility] inpatient rehab today.  Discharge Diagnoses:  Active Problems:   History of renal cell carcinoma   Essential hypertension   OSA (obstructive sleep apnea)   Hypothyroidism   History of pulmonary embolism   Paroxysmal atrial fibrillation (HCC)   Respiratory failure (HCC)   HCAP (healthcare-associated pneumonia)   Pericardial effusion   SOB (shortness of breath)   Elevated troponin   Acute on chronic systolic and diastolic heart failure, NYHA class 2 (HCC)   Dyspnea   Pneumonia of right middle lobe due to infectious organism [facility])   Pneumonia due to methicillin resistant Staphylococcus aureus (MRSA) ([place])   Discharge Condition: Stable, improved  Filed Weights   02/23/18 0358 02/24/18 0306 02/25/18 0700  Weight: 122.5 kg 122.5 kg 118.6 kg    History of present illness:   As per Dr. Hal Hope on 8/8:  Wayne Booth is a 77 y.o. male with history of atrial fibrillation status post recent ablation by Dr. Curt Bears cardiologist, sleep apnea, hypothyroidism, history of PE, hypertension presents to the ER because of worsening shortness of breath with minimal exertion over the last 5 days since ambulation.  He has been having also right-sided pleuritic chest pain.  Denies any cough fever chills.  Denies any nausea vomiting or diarrhea.  ED Course: In the ER patient was found to be febrile with chest x-ray showing infiltrates.  Since ER physician found slight pericardial effusion cardiology was notified and had bedside echo as per the cardiology there is no significant pericardial effusion.  Patient was started on empiric antibiotics for pneumonia and admitted for same.  Patient was placed on BiPAP since patient was  hypoxic.  Hospital Course:   Acute hypoxemic respiratory failure -Due to MRSA pneumonia. -Patient has completed antibiotic therapy, oxygen saturations remained stable on room air. -Patient has had a very prolonged hospitalization and is very weak and deconditioned. -Has been evaluated by CIR and will be discharged there today.  Sepsis due to MRSA pneumonia -Sepsis physiology has resolved.  Echocardiogram shows preserved LV systolic function.  Acute metabolic encephalopathy with delirium -Currently resolved, likely secondary to hypoxemia and sepsis.  Atrial fibrillation -Heart rate is well controlled, not on rate controlling medications at this time. -Continue Eliquis for anticoagulation purposes.  COPD -Stable, no wheezing, continue nebs.  Hypothyroidism -Continue Synthroid.  Acute on chronic kidney disease stage III -Baseline creatinine around 1.2, creatinine on admission was 2.4, last documented creatinine was 1.6 on 8/30.  Procedures:  As above    Consultations:  Cardiology  Discharge Instructions  Discharge Instructions    Increase activity slowly   Complete by:  As directed      Allergies as of 02/26/2018      Reactions   Ambien [zolpidem] Anxiety   Anxiety and agitation when tried as sleeper in hospital aug 2019      Medication List    STOP taking these medications   allopurinol 300 MG tablet Commonly known as:  ZYLOPRIM   amiodarone 200 MG tablet Commonly known as:  PACERONE   budesonide-formoterol 160-4.5 MCG/ACT inhaler Commonly known as:  SYMBICORT   clotrimazole-betamethasone cream Commonly known as:  LOTRISONE   colchicine 0.6 MG tablet   cyclobenzaprine 10 MG tablet Commonly known as:  FLEXERIL  diltiazem 240 MG 24 hr capsule Commonly known as:  CARDIZEM CD   furosemide 20 MG tablet Commonly known as:  LASIX   metoprolol tartrate 25 MG tablet Commonly known as:  LOPRESSOR   nitroGLYCERIN 0.4 MG SL tablet Commonly known as:   NITROSTAT   Suvorexant 10 MG Tabs     TAKE these medications   albuterol 108 (90 Base) MCG/ACT inhaler Commonly known as:  PROVENTIL HFA;VENTOLIN HFA Inhale 2 puffs into the lungs every 6 (six) hours as needed for wheezing or shortness of breath.   apixaban 5 MG Tabs tablet Commonly known as:  ELIQUIS Take 1 tablet (5 mg total) by mouth 2 (two) times daily.   budesonide 0.5 MG/2ML nebulizer solution Commonly known as:  PULMICORT Take 2 mLs (0.5 mg total) by nebulization 2 (two) times daily.   docusate sodium 100 MG capsule Commonly known as:  COLACE Take 1 capsule (100 mg total) by mouth daily.   escitalopram 20 MG tablet Commonly known as:  LEXAPRO TAKE ONE TABLET BY MOUTH DAILY   ipratropium-albuterol 0.5-2.5 (3) MG/3ML Soln Commonly known as:  DUONEB Take 3 mLs by nebulization every 4 (four) hours as needed.   levothyroxine 150 MCG tablet Commonly known as:  SYNTHROID, LEVOTHROID TAKE ONE TABLET BY MOUTH DAILY   multivitamins ther. w/minerals Tabs tablet Take 1 tablet by mouth daily.   nortriptyline 75 MG capsule Commonly known as:  PAMELOR TAKE ONE CAPSULE BY MOUTH AT BEDTIME   senna 8.6 MG Tabs tablet Commonly known as:  SENOKOT Take 1 tablet (8.6 mg total) by mouth daily.      Allergies  Allergen Reactions  . Ambien [Zolpidem] Anxiety    Anxiety and agitation when tried as sleeper in hospital aug 2019      The results of significant diagnostics from this hospitalization (including imaging, microbiology, ancillary and laboratory) are listed below for reference.    Significant Diagnostic Studies: Dg Chest 1 View  Result Date: 02/13/2018 CLINICAL DATA:  Respiratory abnormality. EXAM: CHEST  1 VIEW COMPARISON:  02/12/2018 FINDINGS: Endotracheal tube has tip 4.6 cm above the carina. Nasogastric tube courses into the region of the stomach as tip is not visualized. Interval placement of right-sided PICC line with tip just below the cavoatrial junction. Lungs  are hypoinflated with stable hazy opacification over the left base likely due to small effusion with atelectasis. Mild prominence of the central perihilar markings which may be due to mild degree of vascular congestion. Stable moderate cardiomegaly. Remainder of the exam is unchanged. IMPRESSION: Stable hazy left base opacification likely small effusion with atelectasis. Possible small right effusion. Stable cardiomegaly with suggestion of minimal vascular congestion. Tubes and lines as described. Electronically Signed   By: Marin Olp M.D.   On: 02/13/2018 09:38   Dg Chest 2 View  Result Date: 02/05/2018 CLINICAL DATA:  Short of breath. Mid chest pain for a couple of days. EXAM: CHEST - 2 VIEW COMPARISON:  02/03/2018 and older exams. FINDINGS: Hazy airspace consolidation is noted on the right predominantly in the mid lung. There is basilar consolidation, greater on the left. These findings are stable from the previous exam and previous chest CT. No new lung abnormalities. Heart is normal in size. No convincing mediastinal or hilar masses. Small effusion seen on the prior CT are not well-defined radiographically. They are likely unchanged. No pneumothorax. IMPRESSION: 1. No significant change in lung aeration from the most recent prior exams. Bilateral airspace lung opacities consistent with multifocal pneumonia. Electronically  Signed   By: Lajean Manes M.D.   On: 02/05/2018 18:33   Dg Chest 2 View  Result Date: 01/31/2018 CLINICAL DATA:  Shortness of breath and history of atrial fibrillation. EXAM: CHEST - 2 VIEW COMPARISON:  12/07/2017 FINDINGS: The heart is mildly enlarged. There is evidence probable mild diffuse pulmonary interstitial edema. No significant pleural fluid identified. No airspace consolidation, pneumothorax or focal mass identified. Diffuse degenerative disease noted throughout the visualized spine. IMPRESSION: Cardiac enlargement and mild diffuse pulmonary interstitial edema.  Electronically Signed   By: Aletta Edouard M.D.   On: 01/31/2018 16:06   Ct Chest Wo Contrast  Result Date: 02/03/2018 CLINICAL DATA:  Dyspnea and right-sided chest pain with fever. EXAM: CT CHEST WITHOUT CONTRAST TECHNIQUE: Multidetector CT imaging of the chest was performed following the standard protocol without IV contrast. COMPARISON:  Chest CT 10/20/2016, CXR 02/03/2018 FINDINGS: Cardiovascular: Cardiomegaly with trace pericardial effusion measuring up to 8 mm anteriorly. Scattered coronary arteriosclerosis is identified along the LAD. Aortic atherosclerosis is noted at the root and aortic arch. No aneurysm is visualized. The unenhanced pulmonary vasculature is unremarkable. Mediastinum/Nodes: Mediastinal lipomatosis. Mild enlargement of right lower paratracheal lymph node to 15 mm short axis with aortic O pulmonary window lymph node measuring up to 11 mm short axis. Smaller superior mediastinal lymph nodes are present. These are likely reactive. Trachea and mainstem bronchi appear patent. Esophagus is unremarkable. No thyromegaly or mass. Lungs/Pleura: Patchy bibasilar, right middle and right upper lobe airspace opacities with air bronchograms consistent with multilobar pneumonia. Small bilateral pleural effusions left greater than right are noted compatible with small parapneumonic effusions. Adjacent atelectasis is seen. No pneumothorax is noted. Upper Abdomen: The patient is status post left nephrectomy. Stable appearance of the retroperitoneum on the left. Soft tissue structure in the left upper quadrant may represent partial volume averaging of the spleen with displacement due to nephrectomy. Surgically absent gallbladder. Mild steatosis of liver. Musculoskeletal: Osteophytosis of the thoracic spine with exaggerated kyphosis. Chronic posterior right eleventh and anterolateral fourth rib fractures. No acute osseous abnormality. IMPRESSION: 1. Bilateral multilobar pneumonia involving the right upper,  middle and both lower lobes with air bronchograms and reactive adenopathy. Small parapneumonic effusions are noted bilaterally. 2. Coronary arteriosclerosis and aortic atherosclerosis. 3. Mediastinal lipomatosis. 4. Hepatic steatosis. 5. Status post cholecystectomy and left nephrectomy. Aortic Atherosclerosis (ICD10-I70.0). Electronically Signed   By: Ashley Royalty M.D.   On: 02/03/2018 18:28   Nm Pulmonary Perf And Vent  Result Date: 02/03/2018 CLINICAL DATA:  Pulmonary embolism EXAM: NUCLEAR MEDICINE VENTILATION - PERFUSION LUNG SCAN TECHNIQUE: Ventilation images were obtained in multiple projections using inhaled aerosol Tc-81m DTPA. Perfusion images were obtained in multiple projections after intravenous injection of Tc-72m-MAA. RADIOPHARMACEUTICALS:  32.5 mCi of Tc-55m DTPA aerosol inhalation and 3.7 mCi Tc16m-MAA IV COMPARISON:  11/02/2017 Correlation: Chest radiograph 02/03/2018 FINDINGS: Ventilation: Central airway deposition of aerosol. Ventilation defects identified in lateral RIGHT mid lung and throughout entire LEFT lower lobe. Perfusion: Much better perfusion and ventilation in the lateral RIGHT mid lung. Perfusion to the LEFT lower lobe much better than ventilation. Remaining perfusion pattern is normal. Perfusion abnormality in LEFT lower lobe is new since the previous exam. Chest radiograph: Enlargement of cardiac silhouette with atelectasis versus consolidation in LEFT lower lobe as well as mild atelectasis in RIGHT mid lung and at RIGHT base IMPRESSION: Much better perfusion than ventilation in lateral RIGHT mid lung and throughout LEFT lower lobe, with remaining perfusion normal. Findings represent a low probability for pulmonary  embolism. Electronically Signed   By: Lavonia Dana M.D.   On: 02/03/2018 16:43   Dg Chest Port 1 View  Result Date: 02/22/2018 CLINICAL DATA:  Follow-up pneumonia EXAM: PORTABLE CHEST 1 VIEW COMPARISON:  Portable chest x-ray of February 21, 2018 FINDINGS: There has been  interval extubation of the trachea and esophagus. The lungs are mildly hypoinflated. The interstitial markings are slightly less conspicuous today but this is in part due to removal of the perforated pad on which the patient is lying. The cardiac silhouette remains enlarged. The pulmonary vascularity is less engorged and more distinct. There is calcification in the wall of the aortic arch. There is multilevel degenerative disc disease of the thoracic spine. The right-sided PICC line tip projects at the cavoatrial junction. IMPRESSION: Interval extubation of the trachea and esophagus. Slight interval improvement in bibasilar atelectasis or pneumonia. Slightly decreased pulmonary vascular congestion. Stable cardiomegaly. Thoracic aortic atherosclerosis. Electronically Signed   By: David  Martinique M.D.   On: 02/22/2018 09:01   Dg Chest Port 1 View  Result Date: 02/21/2018 CLINICAL DATA:  Endotracheal tube present.  Shortness of breath. EXAM: PORTABLE CHEST 1 VIEW COMPARISON:  02/20/2018 FINDINGS: Endotracheal tube is roughly 1.8 cm above the carina. Nasogastric tube is present and extends beyond the image. There continues to be bilateral airspace densities particularly at the lung bases. The patient is rotated and limited evaluation of the cardiac silhouette. Atherosclerotic calcifications at the aortic arch. Negative for a pneumothorax. Right arm PICC line is present and the tip is near the superior cavoatrial junction. IMPRESSION: Persistent bilateral airspace disease which is stable to slightly worse. Support apparatuses as described. Electronically Signed   By: Markus Daft M.D.   On: 02/21/2018 07:00   Dg Chest Port 1 View  Result Date: 02/20/2018 CLINICAL DATA:  Followup ventilator support EXAM: PORTABLE CHEST 1 VIEW COMPARISON:  02/19/2018 FINDINGS: Endotracheal tube tip is 3.5 cm above the carina. Nasogastric tube enters the abdomen. Right arm PICC tip is at the SVC RA junction. Artifact overlies the chest.  Mild to moderate atelectasis in both lower lobes persists. IMPRESSION: Lines and tubes well positioned. Mild to moderate lower lobe atelectasis persists. Electronically Signed   By: Nelson Chimes M.D.   On: 02/20/2018 08:05   Dg Chest Port 1 View  Result Date: 02/19/2018 CLINICAL DATA:  Followup ventilator support EXAM: PORTABLE CHEST 1 VIEW COMPARISON:  02/19/2018 FINDINGS: Endotracheal tip is 2 cm above the carina. Nasogastric tube enters the abdomen. Right arm PICC tip is in the proximal right atrium. There is mild pulmonary edema. There are bilateral effusions with lower lobe atelectasis. IMPRESSION: No radiographic change. Lines and tubes well positioned. Persistent edema, effusions and lower lung volume loss. Electronically Signed   By: Nelson Chimes M.D.   On: 02/19/2018 13:03   Dg Chest Port 1 View  Result Date: 02/19/2018 CLINICAL DATA:  Respiratory failure. EXAM: PORTABLE CHEST 1 VIEW COMPARISON:  Radiograph of February 18, 2018. FINDINGS: Stable cardiomegaly. Endotracheal and nasogastric tubes are unchanged in position. Stable position of right-sided PICC line is noted. No pneumothorax is noted. Stable bilateral pulmonary edema is noted with associated pleural effusions. Bony thorax is unremarkable. IMPRESSION: Stable support apparatus. Stable bilateral pulmonary edema with associated pleural effusions. Electronically Signed   By: Marijo Conception, M.D.   On: 02/19/2018 08:34   Dg Chest Port 1 View  Result Date: 02/18/2018 CLINICAL DATA:  Respiratory failure. EXAM: PORTABLE CHEST 1 VIEW COMPARISON:  02/17/2018. FINDINGS: Right PICC  line noted with tip over cavoatrial junction. Endotracheal tube and NG tube in stable position. Cardiomegaly with diffuse bilateral pulmonary infiltrates/edema bilateral pleural effusions. Findings consistent CHF. No pneumothorax. IMPRESSION: 1. Right PICC line noted with tip at cavoatrial junction. Endotracheal tube and NG tube in stable position. 2. Cardiomegaly with  diffuse bilateral pulmonary infiltrates/edema and bilateral pleural effusions. These findings have progressed from prior exam. Electronically Signed   By: Marcello Moores  Register   On: 02/18/2018 06:22   Dg Chest Port 1 View  Result Date: 02/17/2018 CLINICAL DATA:  Encounter for endotracheal tube placement EXAM: PORTABLE CHEST 1 VIEW COMPARISON:  Earlier today FINDINGS: Retracted endotracheal tube with tip now between the clavicular heads and carina. An orogastric tube reaches the stomach. Right upper extremity PICC with tip at the SVC. Haziness of the lower chest from atelectasis and layering effusions most likely. There is no Kerley lines or pneumothorax. Chronic cardiomegaly and vascular pedicle widening IMPRESSION: 1. Repositioned endotracheal tube now in good position. 2. Otherwise stable. Electronically Signed   By: Monte Fantasia M.D.   On: 02/17/2018 14:43   Dg Chest Port 1 View  Result Date: 02/17/2018 CLINICAL DATA:  Intubation, orogastric tube placement EXAM: PORTABLE CHEST 1 VIEW COMPARISON:  Portable exam 1422 hours compared to 0453 hours FINDINGS: Tip of endotracheal tube projects over proximal RIGHT mainstem bronchus recommend withdrawal 2.5 cm. Tip of orogastric tube extends into stomach below extent of chest x-ray. Enlargement of cardiac silhouette with vascular congestion. Atherosclerotic calcification aorta. Persistent pulmonary infiltrates in the mid to lower lungs. No pneumothorax. IMPRESSION: Tip of endotracheal tube projects over proximal RIGHT mainstem bronchus; recommend withdrawal 2.5 cm. Persistent infiltrates bilaterally. Findings called to [facility] in 7M MICU on 02/17/2018 at 1442 hours. Electronically Signed   By: Lavonia Dana M.D.   On: 02/17/2018 14:43   Dg Chest Port 1 View  Result Date: 02/17/2018 CLINICAL DATA:  Shortness of breath, check endotracheal tube placement EXAM: PORTABLE CHEST 1 VIEW COMPARISON:  02/16/2018 FINDINGS: Endotracheal tube appears to have been advanced  and now lies at the level of the carina directed towards the right mainstem bronchus. This should be withdrawn approximately 3 cm. Nasogastric catheter and right-sided PICC line are stable. Cardiac enlargement is again identified. Persistent bibasilar infiltrates left greater than right are noted with associated small effusions. No bony abnormality is noted. IMPRESSION: Stable appearance of the chest with the exception of interval apparent advancement of the endotracheal tube to the level of the carina now directed towards the right mainstem bronchus. This should be withdrawn approximately 3 cm. These results will be called to the ordering clinician or representative by the Radiologist Assistant, and communication documented in the PACS or zVision Dashboard. Electronically Signed   By: Inez Catalina M.D.   On: 02/17/2018 06:55   Dg Chest Port 1 View  Result Date: 02/16/2018 CLINICAL DATA:  Ventilator support. EXAM: PORTABLE CHEST 1 VIEW COMPARISON:  02/15/2018 FINDINGS: Endotracheal tube tip is 4.5 cm above the carina. Nasogastric tube enters the abdomen. Right arm PICC tip is in the right atrium. Bilateral lower lobe atelectasis/infiltrate persists. No worsening or new finding. IMPRESSION: Lines and tubes appear unchanged. Persistent bilateral lower lobe atelectasis/pneumonia. Electronically Signed   By: Nelson Chimes M.D.   On: 02/16/2018 07:27   Dg Chest Port 1 View  Result Date: 02/15/2018 CLINICAL DATA:  Intubation. EXAM: PORTABLE CHEST 1 VIEW COMPARISON:  02/14/2018. FINDINGS: Endotracheal tube, NG tube, right PICC line stable position. Cardiomegaly with diffuse bilateral pulmonary  infiltrates/edema again noted. Small bilateral pleural effusions again noted. Findings suggest CHF. No pneumothorax. Prior cervical spine fusion. Metallic clip noted over the left chest. IMPRESSION: 1.  Lines and tubes stable position. 2. Cardiomegaly with diffuse bilateral pulmonary infiltrates/edema and small bilateral  pleural effusions again noted. No interim change. Electronically Signed   By: Marcello Moores  Register   On: 02/15/2018 06:21   Dg Chest Port 1 View  Result Date: 02/14/2018 CLINICAL DATA:  Encounter for weaning from ventilator. EXAM: PORTABLE CHEST 1 VIEW COMPARISON:  Radiograph of February 13, 2018. FINDINGS: Endotracheal and nasogastric tubes are unchanged in position. Right-sided PICC line is noted. Stable cardiomegaly with central pulmonary vascular congestion is noted. Stable bibasilar atelectasis or edema is noted with associated pleural effusions. No pneumothorax is noted. Bony thorax is unremarkable. IMPRESSION: Stable support apparatus. Stable bibasilar atelectasis or edema is noted with associated pleural effusions. Electronically Signed   By: Marijo Conception, M.D.   On: 02/14/2018 07:29   Dg Chest Port 1 View  Result Date: 02/12/2018 CLINICAL DATA:  Encounter for endotracheal tube placement. EXAM: PORTABLE CHEST 1 VIEW COMPARISON:  02/12/2018 FINDINGS: Endotracheal tube is 2.6 cm above the carina. Nasogastric tube extends into the abdomen. Persistent densities at the left lung base. Hazy airspace densities in both lungs are again noted. Cardiac silhouette remains enlarged. IMPRESSION: Endotracheal tube is appropriately positioned above the carina. Nasogastric tube extends into the abdomen. Stable bilateral airspace densities and stable consolidation/pleural fluid at the left lung base. Electronically Signed   By: Markus Daft M.D.   On: 02/12/2018 13:37   Dg Chest Port 1 View  Result Date: 02/12/2018 CLINICAL DATA:  Hypoxemia. EXAM: PORTABLE CHEST 1 VIEW COMPARISON:  02/10/2018 prior radiographs FINDINGS: Cardiomegaly and bilateral airspace opacities again noted. LEFT LOWER lung consolidation/atelectasis is unchanged. No pneumothorax identified. Probable small bilateral pleural effusions again noted. Little interval change since the prior study. IMPRESSION: Unchanged appearance of the chest with  bilateral airspace opacities and LEFT LOWER lung consolidation/atelectasis Electronically Signed   By: Margarette Canada M.D.   On: 02/12/2018 11:41   Dg Chest Port 1 View  Result Date: 02/10/2018 CLINICAL DATA:  Shortness of breath. EXAM: PORTABLE CHEST 1 VIEW COMPARISON:  02/06/2018. FINDINGS: Cardiomegaly. Infiltrates noted in the right upper lung and both lung bases. Findings consistent with multilobar pneumonia. No significant interim improvement from prior exam. No pleural effusion or pneumothorax. Scratch small pleural effusions again noted. No pneumothorax. Stable cardiomegaly. Small metallic density again noted the left chest. No acute bony abnormality identified. Degenerative changes thoracic spine. IMPRESSION: 1. Persistent unchanged multifocal bilateral pulmonary infiltrates consistent with multifocal pneumonia. Pulmonary edema cannot be completely excluded. Small bilateral pleural effusions again noted. No interim change. 2.  Stable cardiomegaly. Electronically Signed   By: Marcello Moores  Register   On: 02/10/2018 10:12   Dg Chest Port 1 View  Result Date: 02/06/2018 CLINICAL DATA:  Shortness of breath, right chest pain EXAM: PORTABLE CHEST 1 VIEW COMPARISON:  Chest radiographs dated 02/05/2018. CT chest dated 02/03/2018. FINDINGS: Focal/patchy right midlung opacity. Mild patchy left lower lobe opacity. Mild right basilar opacity. When correlating with prior CT, this likely reflects multifocal pneumonia. Small left pleural effusion. No pneumothorax. Heart is top-normal in size. IMPRESSION: Suspected multifocal pneumonia, right upper lobe predominant, better evaluated on prior CT. Small left pleural effusion. Electronically Signed   By: Julian Hy M.D.   On: 02/06/2018 10:59   Dg Chest Port 1 View  Result Date: 02/03/2018 CLINICAL DATA:  Shortness  of breath EXAM: PORTABLE CHEST 1 VIEW COMPARISON:  02/02/2018 FINDINGS: Shallow lung inflation with unchanged cardiomegaly. Mild right mid lung opacities  are unchanged. Small left pleural effusion with associated atelectasis. IMPRESSION: Unchanged chest radiograph with mild right midlung opacity and small left pleural effusion. Electronically Signed   By: Ulyses Jarred M.D.   On: 02/03/2018 04:16   Dg Chest Port 1 View  Result Date: 02/02/2018 CLINICAL DATA:  Shortness of breath and RIGHT chest pain. Recent atrial fibrillation. EXAM: PORTABLE CHEST 1 VIEW COMPARISON:  Chest radiograph January 31, 2018 FINDINGS: Similar cardiomegaly. Calcified aortic arch. Pulmonary vasculature congestion and interstitial prominence with RIGHT mid lung zone consolidation. Small bilateral pleural effusions. No pneumothorax. No pneumothorax. Soft tissue planes and included osseous structures are non suspicious. IMPRESSION: 1. New RIGHT mid lung zone consolidation concerning for pneumonia versus confluent edema. 2. Stable cardiomegaly and interstitial edema with small pleural effusions. Electronically Signed   By: Elon Alas M.D.   On: 02/02/2018 17:58   [place] Ekg Site Rite  Result Date: 02/12/2018 If Site Rite image not attached, placement could not be confirmed due to current cardiac rhythm.   Microbiology: Recent Results (from the past 240 hour(s))  Culture, blood (routine x 2)     Status: None   Collection Time: 02/18/18 10:38 AM  Result Value Ref Range Status   Specimen Description BLOOD LEFT ANTECUBITAL  Final   Special Requests   Final    BOTTLES DRAWN AEROBIC AND ANAEROBIC Blood Culture adequate volume   Culture   Final    NO GROWTH 5 DAYS Performed at [facility], [address], [place], [place] [address]    Report Status 02/23/2018 FINAL  Final  Culture, blood (routine x 2)     Status: None   Collection Time: 02/18/18 10:38 AM  Result Value Ref Range Status   Specimen Description BLOOD LEFT HAND  Final   Special Requests   Final    BOTTLES DRAWN AEROBIC ONLY Blood Culture results may not be optimal due to an inadequate volume of blood  received in culture bottles   Culture   Final    NO GROWTH 5 DAYS Performed at [facility], [place] [address] [place], [place] [address]    Report Status 02/23/2018 FINAL  Final  Culture, respiratory (non-expectorated)     Status: None   Collection Time: 02/18/18 12:00 PM  Result Value Ref Range Status   Specimen Description TRACHEAL ASPIRATE  Final   Special Requests NONE  Final   Gram Stain   Final    RARE WBC PRESENT,BOTH PMN AND MONONUCLEAR NO ORGANISMS SEEN Performed at [facility], [place] [address] [place], [place] [address]    Culture FEW CANDIDA ALBICANS  Final   Report Status 02/20/2018 FINAL  Final  Culture, Urine     Status: None   Collection Time: 02/22/18 12:58 PM  Result Value Ref Range Status   Specimen Description URINE, CATHETERIZED  Final   Special Requests NONE  Final   Culture   Final    NO GROWTH Performed at [facility], [place] [address], [place], [place] [address]    Report Status 02/23/2018 FINAL  Final  Culture, blood (routine x 2)     Status: None (Preliminary result)   Collection Time: 02/22/18  1:23 PM  Result Value Ref Range Status   Specimen Description BLOOD BLOOD LEFT HAND  Final   Special Requests   Final  BOTTLES DRAWN AEROBIC ONLY Blood Culture adequate volume   Culture   Final    NO GROWTH 3 DAYS Performed at [facility], [place] [address], [place], [place] [address]    Report Status PENDING  Incomplete  Culture, blood (single)     Status: None (Preliminary result)   Collection Time: 02/22/18  4:43 PM  Result Value Ref Range Status   Specimen Description BLOOD LEFT HAND  Final   Special Requests   Final    BOTTLES DRAWN AEROBIC ONLY Blood Culture results may not be optimal due to an inadequate volume of blood received in culture bottles   Culture   Final    NO GROWTH 3 DAYS Performed at [facility], [place] [address] [place], [place] [address]    Report Status PENDING  Incomplete     Labs: Basic  Metabolic Panel: Recent Labs  Lab 02/20/18 0508 02/21/18 0446 02/22/18 0555 02/23/18 0348 02/24/18 0320 02/25/18 0158  NA 142 142 146* 141 139 135  K 3.6 3.5 3.8 3.2* 4.0 4.0  CL 102 103 103 101 103 99  CO2 30 30 30 29 23 26   GLUCOSE 181* 157* 126* 126* 81 165*  BUN 72* 79* 73* 67* 53* 39*  CREATININE 2.14* 1.93* 2.42* 1.94* 1.71* 1.64*  CALCIUM 8.9 8.7* 9.7 8.9 8.9 9.0  MG 2.2 2.4 2.2 2.4  --   --   PHOS 4.8* 4.1 4.3 5.4*  --   --    Liver Function Tests: Recent Labs  Lab 02/20/18 0508 02/21/18 0446 02/22/18 0555 02/23/18 0348 02/24/18 0320  AST 161* 121* 75* 46* 61*  ALT 312* 290* 233* 155* 119*  ALKPHOS 120 125 133* 107 93  BILITOT 1.6* 1.3* 1.8* 1.3* 1.0  PROT 7.1 6.8 7.6 6.8 6.7  ALBUMIN 2.6* 2.5* 2.8* 2.6* 2.6*   No results for input(s): LIPASE, AMYLASE in the last 168 hours. No results for input(s): AMMONIA in the last 168 hours. CBC: Recent Labs  Lab 02/21/18 0446 02/22/18 0555 02/23/18 0348 02/24/18 0320 02/25/18 0158  WBC 21.4* 23.6* 21.0* 20.8* 19.5*  NEUTROABS  --  17.3* 14.8*  --  14.9*  HGB 14.3 16.2 15.0 15.2 14.2  HCT 45.8 50.8 47.0 46.9 44.5  MCV 93.5 92.2 92.5 91.2 91.4  PLT 427* 447* 351 330 344   Cardiac Enzymes: No results for input(s): CKTOTAL, CKMB, CKMBINDEX, TROPONINI in the last 168 hours. BNP: BNP (last 3 results) Recent Labs    02/02/18 1727 02/11/18 0957 02/14/18 0500  BNP 54.3 64.4 145.0*    ProBNP (last 3 results) No results for input(s): PROBNP in the last 8760 hours.  CBG: Recent Labs  Lab 02/22/18 1542 02/22/18 2025 02/23/18 0344 02/23/18 0740 02/24/18 0621  GLUCAP 104* 110* 119* 132* 98       Signed:  Lelon Frohlich  [facility] Pager: [phone] 02/26/2018, 8:13 AM

## 2018-02-26 NOTE — H&P (Signed)
Physical Medicine and Rehabilitation Admission H&P     Chief Complaint  Patient presents with  . Shortness of Breath  :  HPI: Wayne Booth is a 77 year old right-handed male with history of atrial fibrillation status post ablation maintained on Eliquis, sleep apnea, COPD, CKD stage III, history of pulmonary emboli, hypertension. Per chart review patient lives with spouse and independent prior to admission with occasional use of a cane. Two-level home with bedroom on [address]. Presented 02/03/2018 with increasing shortness of breath and decreased mobility over a 5-day span as well as associated right-sided pleuritic chest pain. Chest x-ray showed right lung mid lung zone consolidation concerning for pneumonia. Perfusion scan low probability for pulmonary emboli. CT of the chest showed bilateral multilobar pneumonia involving the right upper, middle and both lower lobes. Mild hyponatremia 131, troponin 0.12, lactic acid 1.51. Placed on broad-spectrum antibiotics for suspect sepsis. Echocardiogram with ejection fraction of 55% no wall motion abnormalities. Patient did require intubation for acute respiratory failure 02/12/2018 until 02/17/2018. Persistent leukocytosis 23,600 improved until 19,500 infectious disease consulted antibiotic course completed advised to continue to monitor. Recent blood cultures negative. MRSA PCR screening positive with contact precautions. Therapy evaluations completed with recommendations of physical medicine rehab consult. Patient was admitted for a comprehensive rehab program.  Review of Systems  Constitutional: Negative for chills and fever.  HENT: Negative for hearing loss.  Eyes: Negative for blurred vision and double vision.  Respiratory: Positive for cough and shortness of breath.  Cardiovascular: Positive for palpitations and leg swelling.  Gastrointestinal: Positive for constipation. Negative for nausea and vomiting.  Genitourinary: Positive for urgency.    Musculoskeletal: Positive for joint pain and myalgias.  Skin: Negative for rash.  All other systems reviewed and are negative.       Past Medical History:  Diagnosis Date  . Allergy    States his nose runs when he is around grease.   Marland Kitchen Anxiety   . Arthritis   . Atrial fibrillation ([place])   . Cancer ([place])   . Coronary artery disease   . History of total knee replacement   . Hypertension   . Hypothyroidism   . Kidney cancer, primary, with metastasis from kidney to other site [facility])   . Kidney stone   . OSA (obstructive sleep apnea)    pt does not wear cpap at night  . Pituitary cyst ([place])   . Pneumonia    hx of  . Prostate cancer [facility])         Past Surgical History:  Procedure Laterality Date  . ABLATION OF DYSRHYTHMIC FOCUS  01/28/2018  . ANTERIOR CERVICAL DECOMP/DISCECTOMY FUSION N/A 08/29/2012   Procedure: ANTERIOR CERVICAL DECOMPRESSION/DISCECTOMY FUSION 1 LEVEL; Surgeon: Eustace Moore, MD; Location: [place] NEURO ORS; Service: Neurosurgery; Laterality: N/A;  . APPENDECTOMY    . ATRIAL FIBRILLATION ABLATION N/A 01/28/2018   Procedure: ATRIAL FIBRILLATION ABLATION; Surgeon: Constance Haw, MD; Location: [place] CV LAB; Service: Cardiovascular; Laterality: N/A;  . CARDIAC CATHETERIZATION  2008   clean  . CHOLECYSTECTOMY    . COLONOSCOPY W/ POLYPECTOMY    . CRANIOTOMY N/A 11/08/2012   Procedure: CRANIOTOMY HYPOPHYSECTOMY TRANSNASAL APPROACH; Surgeon: Faythe Ghee, MD; Location: [place] [facility]UR[facility] ORS; Service: Neurosurgery; Laterality: N/A; Transphenoidal Resection of Pituitary Tumor  . FINGER SURGERY Left 2017   Dr Amedeo Plenty  . INSERTION PROSTATE RADIATION SEED    . KIDNEY STONE SURGERY    . KNEE ARTHROSCOPY  2007   left  . NEPHRECTOMY Left   .  POSTERIOR CERVICAL LAMINECTOMY Left 04/24/2015   Procedure: Foraminotomy cervical five - cervical six cervical six - cervical seven left; Surgeon: Eustace Moore, MD; Location: [facility] NEUR[facility]; Service: Neurosurgery; Laterality: Left;  .  REPLACEMENT TOTAL KNEE BILATERAL          Family History  Problem Relation Age of Onset  . Cancer Father    ?mouth, died before patient born  . Cancer Mother    stomach, died when he was young  . Kidney cancer Sister   . Colon cancer Brother 23  . Esophageal cancer Neg Hx   . Rectal cancer Neg Hx   . Stomach cancer Neg Hx    Social History: reports that he has quit smoking. His smoking use included cigars. He quit after 4.00 years of use. He has never used smokeless tobacco. He reports that he drinks about 2.0 standard drinks of alcohol per week. He reports that he does not use drugs.  Allergies:       Allergies  Allergen Reactions  . Ambien [Zolpidem] Anxiety    Anxiety and agitation when tried as sleeper in hospital aug 2019         Medications Prior to Admission  Medication Sig Dispense Refill  . albuterol (PROVENTIL HFA;VENTOLIN HFA) 108 (90 Base) MCG/ACT inhaler Inhale 2 puffs into the lungs every 6 (six) hours as needed for wheezing or shortness of breath. 1 Inhaler 5  . allopurinol (ZYLOPRIM) 300 MG tablet Take 0.5 tablets (150 mg total) by mouth daily. 30 tablet 3  . amiodarone (PACERONE) 200 MG tablet Take 1 tablet (200 mg total) by mouth daily. 90 tablet 3  . apixaban (ELIQUIS) 5 MG TABS tablet Take 1 tablet (5 mg total) by mouth 2 (two) times daily. 60 tablet 1  . budesonide-formoterol (SYMBICORT) 160-4.5 MCG/ACT inhaler Inhale 2 puffs into the lungs 2 (two) times daily as needed (for respiratory flares).    . clotrimazole-betamethasone (LOTRISONE) cream Apply 1 application topically 2 (two) times daily. Use for 10 days maximum. (Patient taking differently: Apply 1 application topically 2 (two) times daily as needed (for rashes). ) 45 g 1  . colchicine 0.6 MG tablet Take 1 tablet (0.6 mg total) by mouth daily. 30 tablet 3  . cyclobenzaprine (FLEXERIL) 10 MG tablet Take 10 mg by mouth daily as needed for muscle spasms.     Marland Kitchen diltiazem (CARDIZEM CD) 240 MG 24 hr capsule  Take 1 capsule (240 mg total) by mouth daily. 30 capsule 3  . escitalopram (LEXAPRO) 20 MG tablet TAKE ONE TABLET BY MOUTH DAILY 90 tablet 3  . furosemide (LASIX) 20 MG tablet Take 1 tablet (20 mg total) by mouth daily. 30 tablet 3  . levothyroxine (SYNTHROID, LEVOTHROID) 150 MCG tablet TAKE ONE TABLET BY MOUTH DAILY 90 tablet 2  . metoprolol tartrate (LOPRESSOR) 25 MG tablet Take 0.5 tablets (12.5 mg total) by mouth 2 (two) times daily. 60 tablet 3  . Multiple Vitamins-Minerals (MULTIVITAMINS THER. W/MINERALS) TABS Take 1 tablet by mouth daily.    . nitroGLYCERIN (NITROSTAT) 0.4 MG SL tablet Place 1 tablet (0.4 mg total) under the tongue every 5 (five) minutes as needed for chest pain. 30 tablet 12  . nortriptyline (PAMELOR) 75 MG capsule TAKE ONE CAPSULE BY MOUTH AT BEDTIME 90 capsule 1  . Suvorexant (BELSOMRA) 10 MG TABS Take 10 mg by mouth at bedtime. 30 tablet 5   Drug Regimen Review  Drug regimen was reviewed and remains appropriate with no significant issues identified  Home:  Home Living  Family/patient expects to be discharged to:: Private residence  Living Arrangements: Spouse/significant other  Available Help at Discharge: Family, Available 24 hours/day  Type of Home: House  Home Access: Level entry  Home Layout: Two level, Able to live on main level with bedroom/bathroom  Bathroom Shower/Tub: Tourist information centre manager: Standard  Bathroom Accessibility: Yes  Home Equipment: [place] - single point  Functional History:  Prior Function  Level of Independence: Independent  Functional Status:  Mobility:  Bed Mobility  Overal bed mobility: Needs Assistance  Bed Mobility: Supine to Sit, Sit to Supine  Supine to sit: Max assist, HOB elevated  Sit to supine: Total assist, +2 for physical assistance  General bed mobility comments: pt up in recliner  Transfers  Overall transfer level: Needs assistance  Equipment used: Rolling walker (2 wheeled)  Transfer via Lift Equipment:  St[place]  Transfers: Sit to/from Stand  Sit to Stand: Max assist, +2 physical assistance  General transfer comment: max lifting A and vc for body mechanics  Ambulation/Gait  Ambulation/Gait assistance: Air cabin crew (Feet): 40 Feet  Assistive device: Rolling walker (2 wheeled)  Gait Pattern/deviations: Step-through pattern, Shuffle, Trunk flexed  General Gait Details: min A for steadying, pt with decreased foot clearance with progress, vc for upright posture and procimity to RW  Gait velocity: slowed  Gait velocity interpretation: <1.31 ft/sec, indicative of household ambulator   ADL:  ADL  Overall ADL's : Needs assistance/impaired  Eating/Feeding: Independent  Grooming: Wash/dry hands, Sitting, Modified independent  Upper Body Bathing: Sitting, Set up  Lower Body Bathing: Moderate assistance, Sitting/lateral leans  Upper Body Dressing : Set up, Sitting  Lower Body Dressing: Maximal assistance, Sit to/from stand  Toilet Transfer: Maximal assistance, +2 for physical assistance, +2 for safety/equipment, Stand-pivot, RW, Requires wide/bariatric  Toileting- Clothing Manipulation and Hygiene: Total assistance, +2 for physical assistance, +2 for safety/equipment  Functional mobility during ADLs: Total assistance, +2 for physical assistance, +2 for safety/equipment, Rolling walker  Cognition:  Cognition  Overall Cognitive Status: Impaired/Different from baseline  Orientation Level: Oriented X4  Cognition  Arousal/Alertness: Awake/alert  Behavior During Therapy: WFL for tasks assessed/performed  Overall Cognitive Status: Impaired/Different from baseline  Area of Impairment: Problem solving, Safety/judgement  Safety/Judgement: Decreased awareness of safety, Decreased awareness of deficits  Problem Solving: Slow processing  General Comments: Pt AOx4 and despite some slow processing, WFL for all tasks assessed   Physical Exam:  Blood pressure 130/87, pulse 85, temperature 97.7 F  (36.5 C), temperature source Oral, resp. rate (!) 22, height 5\' 10"  (1.778 m), weight 122.5 kg, SpO2 97 %.  Physical Exam  Vitals reviewed.  Constitutional: He is oriented to person, place, and time. He appears well-developed.  obese  HENT:  Head: Normocephalic.  Eyes: EOM are normal. Right eye exhibits no discharge. Left eye exhibits no discharge.  Neck: Normal range of motion. Neck supple. No thyromegaly present.  Cardiovascular: Normal rate. Exam reveals no gallop and no friction rub.  No murmur heard. IRR IRR  Respiratory: Effort normal. No respiratory distress. He has no wheezes. He has no rales.  A few scattered rhonchi, breath sounds decreased at bases.  GI: Soft. Bowel sounds are normal. He exhibits no distension. There is no tenderness.  Musculoskeletal: He exhibits edema (trace LE edema). Pain over bilateral fore feet with palpation.   Neurological: He is alert and oriented to person, place, and time. No cranial nerve deficit. Coordination normal.  UE 4- deltoid, 4/5  Biceps and triceps, 5/5 Grip/wrist. LE: 3+/5 HF, KE and 4/5 ADF/PF.  Skin: Skin is warm and dry.  Psychiatric: He has a normal mood and affect. His behavior is normal. Judgment and thought content normal.   Lab Results Last 48 Hours                                                                                                                                                                                                                                                                                                                                                                                                                                                                                                       Imaging Results (Last 48 hours)     Medical Problem List and Plan:  1. Debility secondary to pneumonia/respiratory failure  with resolving sepsis.  -admit to inpatient rehab  2. DVT Prophylaxis/Anticoagulation: Chronic Eliquis  3. Pain Management: Tylenol as needed  4. Mood: Lexapro 20 mg daily, Pamelor 75 mg nightly  5. Neuropsych: This patient is capable of making decisions on his own behalf.  6.  Skin/Wound Care: Routine skin checks  7. Fluids/Electrolytes/Nutrition: Routine in and outs with follow-up chemistries  8. Atrial fibrillation status post ablation. Continue Eliquis. Cardiac rate controlled  9. CKD stage III. Monitor labs, I's and O's  10. MRSA PCR screening positive. Contact precautions  11. Leukocytosis. Improving. Antibiotic therapy completed.Recent blood cultures negative. Follow-up infectious disease as needed  12. Hypothyroidism. Synthroid  13. Obesity. BMI 38.75. Dietary follow-up  14. Gout flare, bilateral feet. Pt has hx  -begin oral prednisone   Post Admission Physician Evaluation:  1. Functional deficits secondary to debility. 2. Patient is admitted to receive collaborative, interdisciplinary care between the physiatrist, rehab nursing staff, and therapy team. 3. Patient's level of medical complexity and substantial therapy needs in context of that medical necessity cannot be provided at a lesser intensity of care such as a SNF. 4. Patient has experienced substantial functional loss from his/her baseline which was documented above under the "Functional History" and "Functional Status" headings. Judging by the patient's diagnosis, physical exam, and functional history, the patient has potential for functional progress which will result in measurable gains while on inpatient rehab. These gains will be of substantial and practical use upon discharge in facilitating mobility and self-care at the household level. 5. Physiatrist will provide 24 hour management of medical needs as well as oversight of the therapy plan/treatment and provide guidance as appropriate regarding the interaction of the  two. 6. The Preadmission Screening has been reviewed and patient status is unchanged unless otherwise stated above. 7. 24 hour rehab nursing will assist with bladder management, bowel management, safety, skin/wound care, disease management, medication administration, pain management and patient education and help integrate therapy concepts, techniques,education, etc. 8. PT will assess and treat for/with: Lower extremity strength, range of motion, stamina, balance, functional mobility, safety, adaptive techniques and equipment, activity tolerance, family ed. Goals are: mod I . 9. OT will assess and treat for/with: ADL's, functional mobility, safety, upper extremity strength, adaptive techniques and equipment, activity tolerance, family ed. Goals are: mod I to supervision. Therapy may proceed with showering this patient. 10. SLP will assess and treat for/with: n/a. Goals are: n/a. 11. Case Management and Social Worker will assess and treat for psychological issues and discharge planning. 12. Team conference will be held weekly to assess progress toward goals and to determine barriers to discharge. 13. Patient will receive at least 3 hours of therapy per day at least 5 days per week. 14. ELOS: 14-18 days  15. Prognosis: excellent   I have personally performed a face to face diagnostic evaluation of this patient and formulated the key components of the plan. Additionally, I have personally reviewed laboratory data, imaging studies, as well as relevant notes and concur with the physician assistant's documentation above.   Meredith Staggers, MD, Mellody Drown  Lavon Paganini Sesser, PA-C  02/25/2018  The patient's status has not changed. The original post admission physician evaluation remains appropriate, and any changes from the pre-admission screening or documentation from the acute chart are noted above.   02/26/18

## 2018-02-26 NOTE — Care Management Note (Signed)
Case Management Note  Patient Details  Name: Wayne Booth MRN: [id number] Date of Birth: Aug 12, 1940  Subjective/Objective:                    Action/Plan:  Will DC to [facility]   Expected Discharge Date:  02/26/18               Expected Discharge Plan:  IP Rehab Facility  In-House Referral:     Discharge planning Services  CM Consult  Post Acute Care Choice:    Choice offered to:     DME Arranged:    DME Agency:     HH Arranged:    HH Agency:     Status of Service:  Completed, signed off  If discussed at [employer] of Stay Meetings, dates discussed:    Additional Comments:  Carles Collet, RN 02/26/2018, 8:46 AM

## 2018-02-26 NOTE — Progress Notes (Addendum)
Pt arrived to unit with personal belongings, wife at bedside. Pt is A&O, able to make needs known. Pt requested bathroom, pt ambulated to bathroom with RW, SOB with activity. C/O gout to both feet with redness and +1 pitting edema to left foot and rash to anterior foot that wife reports was not there yesterday.Wife to bring in CPAP from home. Reports that had not had chance to use it because has been in hospital. Michela Pitcher that had  Pt also wants DNR removed and made Full Code.  Pt reports that wants all life saving measures. Pt also reports that would like medication started for gout as he has not gotten any today. Pt sitting up in bed with SR up x 3 and call light in reach.

## 2018-02-26 NOTE — Progress Notes (Signed)
Physical Medicine and Rehabilitation Consult Reason for Consult: Decreased functional mobility Referring Physician: [facility]     HPI: Wayne Booth is a 77 y.o. right-handed male with history of atrial fibrillation status post ablation maintained on Eliquis, sleep apnea, CKD stage III, history of PE, hypertension.  Per chart review patient lives with spouse independent prior to admission with occasional use of a cane.  Two-level home with bedroom on [address].  Presented 02/03/2018 with increasing shortness of breath and decreased mobility over a 5-day.  As well as associated right side pleuritic chest pain.  Chest x-ray showed right lung mid lung zone consolidation concerning for pneumonia.  Perfusion scan low probability for pulmonary embolism.  CT of the chest showing bilateral multilobar pneumonia involving the right upper, middle and both lower lobes.  Mild hyponatremia 131, troponin 0.12, lactic acid 1.51.  Placed on broad-spectrum antibiotics for suspect sepsis.  Echocardiogram with ejection fraction of 55% no wall motion abnormalities.  Patient did require intubation for acute respiratory failure 02/12/2018 until 02/17/2018.  Persistent leukocytosis 23,600 infectious disease consulted with antibiotics since discontinued advised to continue to monitor.  MRSA PCR screening positive with contact precautions.  Occupational therapy evaluation completed 02/24/2018 with recommendations of physical medicine rehab consult.   Patient no longer requiring O2.  He denies any shortness of breath.  He does have complaints of poor endurance even when getting up from bed to chair   Review of Systems  Constitutional: Negative for chills and fever.  HENT: Negative for hearing loss.   Eyes: Negative for blurred vision and double vision.  Respiratory: Positive for cough and shortness of breath.   Cardiovascular: Positive for palpitations and leg swelling.  Gastrointestinal: Positive for constipation. Negative for nausea.   Genitourinary: Positive for urgency. Negative for dysuria, flank pain and hematuria.  Musculoskeletal: Positive for joint pain and myalgias.  Skin: Negative for rash.  All other systems reviewed and are negative.       Past Medical History:  Diagnosis Date  . Allergy      States his nose runs when he is around grease.   Marland Kitchen Anxiety    . Arthritis    . Atrial fibrillation ([place])    . Cancer ([place])    . Coronary artery disease    . History of total knee replacement    . Hypertension    . Hypothyroidism    . Kidney cancer, primary, with metastasis from kidney to other site [facility])    . Kidney stone    . OSA (obstructive sleep apnea)      pt does not wear cpap at night  . Pituitary cyst ([place])    . Pneumonia      hx of  . Prostate cancer [facility])           Past Surgical History:  Procedure Laterality Date  . ABLATION OF DYSRHYTHMIC FOCUS   01/28/2018  . ANTERIOR CERVICAL DECOMP/DISCECTOMY FUSION N/A 08/29/2012    Procedure: ANTERIOR CERVICAL DECOMPRESSION/DISCECTOMY FUSION 1 LEVEL;  Surgeon: Eustace Moore, MD;  Location: [place] [facility];  Service: Neurosurgery;  Laterality: N/A;  . APPENDECTOMY      . ATRIAL FIBRILLATION ABLATION N/A 01/28/2018    Procedure: ATRIAL FIBRILLATION ABLATION;  Surgeon: Constance Haw, MD;  Location: [place] CV LAB;  Service: Cardiovascular;  Laterality: N/A;  . CARDIAC CATHETERIZATION   2008    clean  . CHOLECYSTECTOMY      . COLONOSCOPY W/ POLYPECTOMY      .  CRANIOTOMY N/A 11/08/2012    Procedure: CRANIOTOMY HYPOPHYSECTOMY TRANSNASAL APPROACH;  Surgeon: Faythe Ghee, MD;  Location: [place] [facility];  Service: Neurosurgery;  Laterality: N/A;  Transphenoidal Resection of Pituitary Tumor  . FINGER SURGERY Left 2017    Dr Amedeo Plenty  . INSERTION PROSTATE RADIATION SEED      . KIDNEY STONE SURGERY      . KNEE ARTHROSCOPY   2007    left  . NEPHRECTOMY Left    . POSTERIOR CERVICAL LAMINECTOMY Left 04/24/2015    Procedure: Foraminotomy cervical five - cervical  six cervical six - cervical seven left;  Surgeon: Eustace Moore, MD;  Location: [facility];  Service: Neurosurgery;  Laterality: Left;  . REPLACEMENT TOTAL KNEE BILATERAL             Family History  Problem Relation Age of Onset  . Cancer Father          ?[place], died before patient born  . Cancer Mother          stomach, died when he was young  . Kidney cancer Sister    . Colon cancer Brother 67  . Esophageal cancer Neg Hx    . Rectal cancer Neg Hx    . Stomach cancer Neg Hx      Social History:  reports that he has quit smoking. His smoking use included cigars. He quit after 4.00 years of use. He has never used smokeless tobacco. He reports that he drinks about 2.0 standard drinks of alcohol per week. He reports that he does not use drugs. Allergies:       Allergies  Allergen Reactions  . Ambien [Zolpidem] Anxiety      Anxiety and agitation when tried as sleeper in hospital aug 2019          Medications Prior to Admission  Medication Sig Dispense Refill  . albuterol (PROVENTIL HFA;VENTOLIN HFA) 108 (90 Base) MCG/ACT inhaler Inhale 2 puffs into the lungs every 6 (six) hours as needed for wheezing or shortness of breath. 1 Inhaler 5  . allopurinol (ZYLOPRIM) 300 MG tablet Take 0.5 tablets (150 mg total) by mouth daily. 30 tablet 3  . amiodarone (PACERONE) 200 MG tablet Take 1 tablet (200 mg total) by mouth daily. 90 tablet 3  . apixaban (ELIQUIS) 5 MG TABS tablet Take 1 tablet (5 mg total) by mouth 2 (two) times daily. 60 tablet 1  . budesonide-formoterol (SYMBICORT) 160-4.5 MCG/ACT inhaler Inhale 2 puffs into the lungs 2 (two) times daily as needed (for respiratory flares).      . clotrimazole-betamethasone (LOTRISONE) cream Apply 1 application topically 2 (two) times daily. Use for 10 days maximum. (Patient taking differently: Apply 1 application topically 2 (two) times daily as needed (for rashes). ) 45 g 1  . colchicine 0.6 MG tablet Take 1 tablet (0.6 mg total) by mouth daily.  30 tablet 3  . cyclobenzaprine (FLEXERIL) 10 MG tablet Take 10 mg by mouth daily as needed for muscle spasms.       Marland Kitchen diltiazem (CARDIZEM CD) 240 MG 24 hr capsule Take 1 capsule (240 mg total) by mouth daily. 30 capsule 3  . escitalopram (LEXAPRO) 20 MG tablet TAKE ONE TABLET BY MOUTH DAILY 90 tablet 3  . furosemide (LASIX) 20 MG tablet Take 1 tablet (20 mg total) by mouth daily. 30 tablet 3  . levothyroxine (SYNTHROID, LEVOTHROID) 150 MCG tablet TAKE ONE TABLET BY MOUTH DAILY 90 tablet 2  . metoprolol tartrate (LOPRESSOR)  25 MG tablet Take 0.5 tablets (12.5 mg total) by mouth 2 (two) times daily. 60 tablet 3  . Multiple Vitamins-Minerals (MULTIVITAMINS THER. W/MINERALS) TABS Take 1 tablet by mouth daily.      . nitroGLYCERIN (NITROSTAT) 0.4 MG SL tablet Place 1 tablet (0.4 mg total) under the tongue every 5 (five) minutes as needed for chest pain. 30 tablet 12  . nortriptyline (PAMELOR) 75 MG capsule TAKE ONE CAPSULE BY MOUTH AT BEDTIME 90 capsule 1  . Suvorexant (BELSOMRA) 10 MG TABS Take 10 mg by mouth at bedtime. 30 tablet 5      Home: Home Living Family/patient expects to be discharged to:: Private residence Living Arrangements: Spouse/significant other Available Help at Discharge: Family, Available 24 hours/day Type of Home: House Home Access: Level entry Home Layout: Two level, Able to live on main level with bedroom/bathroom Bathroom Shower/Tub: Multimedia programmer: Standard Bathroom Accessibility: Yes Home Equipment: Cane - single point  Functional History: Prior Function Level of Independence: Independent Functional Status:  Mobility: Bed Mobility Overal bed mobility: Needs Assistance Bed Mobility: Supine to Sit, Sit to Supine Supine to sit: Max assist, HOB elevated Sit to supine: Total assist, +2 for physical assistance General bed mobility comments: pt up in recliner Transfers Overall transfer level: Needs assistance Equipment used: Rolling walker (2  wheeled) Transfer via Lift Equipment: Stedy Transfers: Sit to/from Stand Sit to Stand: Max assist, +2 physical assistance General transfer comment: max lifting A and vc for body mechanics Ambulation/Gait Ambulation/Gait assistance: Herbalist (Feet): 40 Feet Assistive device: Rolling walker (2 wheeled) Gait Pattern/deviations: Step-through pattern, Shuffle, Trunk flexed General Gait Details: min A for steadying, pt with decreased foot clearance with progress, vc for upright posture and procimity to RW Gait velocity: slowed Gait velocity interpretation: <1.31 ft/sec, indicative of household ambulator   ADL: ADL Overall ADL's : Needs assistance/impaired Eating/Feeding: Independent Grooming: Wash/dry hands, Sitting, Modified independent Upper Body Bathing: Sitting, Set up Lower Body Bathing: Moderate assistance, Sitting/lateral leans Upper Body Dressing : Set up, Sitting Lower Body Dressing: Maximal assistance, Sit to/from stand Toilet Transfer: Maximal assistance, +2 for physical assistance, +2 for safety/equipment, Stand-pivot, RW, Requires wide/bariatric Toileting- Clothing Manipulation and Hygiene: Total assistance, +2 for physical assistance, +2 for safety/equipment Functional mobility during ADLs: Total assistance, +2 for physical assistance, +2 for safety/equipment, Rolling walker   Cognition: Cognition Overall Cognitive Status: Impaired/Different from baseline Orientation Level: Oriented to person, Oriented to place, Disoriented to time, Disoriented to situation Cognition Arousal/Alertness: Awake/alert Behavior During Therapy: WFL for tasks assessed/performed Overall Cognitive Status: Impaired/Different from baseline Area of Impairment: Problem solving, Safety/judgement Safety/Judgement: Decreased awareness of safety, Decreased awareness of deficits Problem Solving: Slow processing General Comments: Pt AOx4 and despite some slow processing, [facility] for all tasks  assessed   Blood pressure 116/72, pulse 88, temperature (!) 96.4 F (35.8 C), temperature source Axillary, resp. rate 20, height 5\' 10"  (1.778 m), weight 122.5 kg, SpO2 94 %. Physical Exam  Vitals reviewed. Constitutional: He is oriented to person, place, and time.  HENT:  Head: Normocephalic.  Eyes: EOM are normal.  Neck: Normal range of motion. Neck supple. No thyromegaly present.  Cardiovascular:  Cardiac rate controlled  Respiratory: Effort normal and breath sounds normal.  GI: Soft. Bowel sounds are normal. He exhibits no distension.  Neurological: He is alert and oriented to person, place, and time.  Skin: Skin is warm and dry.  Motor strength is 5/5 bilateral deltoid bicep tricep grip hip flexor knee extensor, ankle  dorsiflexor plantar flexor Patient becomes winded during manual muscle testing. Sensation reported as equal to light touch bilateral upper and lower limbs   Lab Results Last 24 Hours       Results for orders placed or performed during the hospital encounter of 02/02/18 (from the past 24 hour(s))  CBC     Status: Abnormal    Collection Time: 02/24/18  3:20 AM  Result Value Ref Range    WBC 20.8 (H) 4.0 - 10.5 K/uL    RBC 5.14 4.22 - 5.81 MIL/uL    Hemoglobin 15.2 13.0 - 17.0 g/dL    HCT 46.9 39.0 - 52.0 %    MCV 91.2 78.0 - 100.0 fL    MCH 29.6 26.0 - 34.0 pg    MCHC 32.4 30.0 - 36.0 g/dL    RDW 15.4 11.5 - 15.5 %    Platelets 330 150 - 400 K/uL  Comprehensive metabolic panel     Status: Abnormal    Collection Time: 02/24/18  3:20 AM  Result Value Ref Range    Sodium 139 135 - 145 mmol/L    Potassium 4.0 3.5 - 5.1 mmol/L    Chloride 103 98 - 111 mmol/L    CO2 23 22 - 32 mmol/L    Glucose, Bld 81 70 - 99 mg/dL    BUN 53 (H) 8 - 23 mg/dL    Creatinine, Ser 1.71 (H) 0.61 - 1.24 mg/dL    Calcium 8.9 8.9 - 10.3 mg/dL    Total Protein 6.7 6.5 - 8.1 g/dL    Albumin 2.6 (L) 3.5 - 5.0 g/dL    AST 61 (H) 15 - 41 U/L    ALT 119 (H) 0 - 44 U/L    Alkaline  Phosphatase 93 38 - 126 U/L    Total Bilirubin 1.0 0.3 - 1.2 mg/dL    GFR calc non Af Amer 37 (L) >60 mL/min    GFR calc Af Amer 43 (L) >60 mL/min    Anion gap 13 5 - 15  Glucose, capillary     Status: None    Collection Time: 02/24/18  6:21 AM  Result Value Ref Range    Glucose-Capillary 98 70 - 99 mg/dL      Imaging Results (Last 48 hours)  No results found.       Assessment/Plan: Diagnosis: Deconditioning after pneumonia and respiratory failure 1. Does the need for close, 24 hr/day medical supervision in concert with the patient's rehab needs make it unreasonable for this patient to be served in a less intensive setting? Yes 2. Co-Morbidities requiring supervision/potential complications: Morbid obesity, atrial fibrillation, chronic kidney disease stage III, essential hypertension 3. Due to bladder management, bowel management, safety, skin/wound care, disease management, medication administration, pain management and patient education, does the patient require 24 hr/day rehab nursing? Yes 4. Does the patient require coordinated care of a physician, rehab nurse, PT (1-2 hrs/day, 5 days/week) and OT (1-2 hrs/day, 5 days/week) to address physical and functional deficits in the context of the above medical diagnosis(es)? Yes Addressing deficits in the following areas: balance, endurance, locomotion, strength, transferring, bowel/bladder control, bathing, dressing, feeding, grooming, toileting and psychosocial support 5. Can the patient actively participate in an intensive therapy program of at least 3 hrs of therapy per day at least 5 days per week? Yes 6. The potential for patient to make measurable gains while on inpatient rehab is excellent 7. Anticipated functional outcomes upon discharge from inpatient rehab are modified independent and supervision  with PT, modified independent and supervision with OT, n/a with SLP. 8. Estimated rehab length of stay to reach the above functional goals  is: 14-18d 9. Anticipated D/C setting: Home 10. Anticipated post D/C treatments: [place] therapy 11. Overall Rehab/Functional Prognosis: good   RECOMMENDATIONS: This patient's condition is appropriate for continued rehabilitative care in the following setting: CIR Patient has agreed to participate in recommended program. Yes Note that insurance prior authorization may be required for reimbursement for recommended care.   Comment:  "I have personally performed a face to face diagnostic evaluation of this patient.  Additionally, I have reviewed and concur with the physician assistant's documentation above." Charlett Blake M.D. [facility] FAAPM&R (Sports Med, Neuromuscular Med) Diplomate Am Board of Electrodiagnostic Med     Elizabeth Sauer 02/24/2018        Revision History                       Routing History

## 2018-02-27 ENCOUNTER — Inpatient Hospital Stay (HOSPITAL_COMMUNITY): Payer: Medicare Other

## 2018-02-27 DIAGNOSIS — M1000 Idiopathic gout, unspecified site: Secondary | ICD-10-CM

## 2018-02-27 DIAGNOSIS — R5381 Other malaise: Principal | ICD-10-CM

## 2018-02-27 LAB — CULTURE, BLOOD (SINGLE): Culture: NO GROWTH

## 2018-02-27 LAB — CULTURE, BLOOD (ROUTINE X 2)
CULTURE: NO GROWTH
Special Requests: ADEQUATE

## 2018-02-27 MED ORDER — SORBITOL 70 % SOLN
30.0000 mL | Freq: Once | Status: AC
  Administered 2018-02-27: 30 mL via ORAL
  Filled 2018-02-27: qty 30

## 2018-02-27 NOTE — Evaluation (Signed)
Physical Therapy Assessment and Plan  Patient Details  Name: Wayne Booth MRN: [id number] Date of Birth: Nov 20, 1940  PT Diagnosis: Difficulty walking and Muscle weakness Rehab Potential: Good ELOS: 7-9 days   Today's Date: 02/27/2018 PT Individual Time: 0803-0900 PT Individual Time Calculation (min): 57 min    Problem List:  Patient Active Problem List   Diagnosis Date Noted  . Debility 02/26/2018  . Pneumonia due to methicillin resistant Staphylococcus aureus (MRSA) ([place])   . Dyspnea   . Pneumonia of right middle lobe due to infectious organism ([place])   . Acute on chronic systolic and diastolic heart failure, NYHA class 2 ([place])   . HCAP (healthcare-associated pneumonia) 02/03/2018  . Pericardial effusion   . SOB (shortness of breath)   . Elevated troponin   . Respiratory failure ([place]) 02/02/2018  . Paroxysmal atrial fibrillation ([place]) 01/28/2018  . Constipation 01/21/2018  . (HFpEF) heart failure with preserved ejection fraction ([place]) 12/16/2017  . Chest pain   . Anticoagulated   . Atrial flutter ([place]) 11/01/2017  . Bilateral foot pain 07/13/2017  . Community acquired pneumonia 06/18/2017  . Shortness of breath 06/14/2017  . History of adenomatous polyp of colon 04/12/2017  . History of pulmonary embolism 10/23/2016  . Cervical disc disease 11/13/2015  . Essential tremor 08/14/2015  . Gout 08/14/2015  . Hypothyroidism 07/12/2014  . Hyperglycemia 07/12/2014  . GAD (generalized anxiety disorder) 11/07/2013  . OSA (obstructive sleep apnea) 07/07/2011  . Asthmatic bronchitis 06/09/2011  . Actinic keratosis 11/27/2009  . Osteoarthritis 11/13/2008  . 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 Morbid obesity ([place]) 10/02/2008  . History of renal cell carcinoma 12/26/2007  . TESTOSTERONE DEFICIENCY 12/28/2006  . Essential hypertension 12/28/2006    Past Medical History:  Past Medical History:  Diagnosis Date  . Allergy    States his nose runs when he is around grease.    Marland Kitchen Anxiety   . Arthritis   . Atrial fibrillation ([place])   . Cancer ([place])   . Coronary artery disease   . History of total knee replacement   . Hypertension   . Hypothyroidism   . Kidney cancer, primary, with metastasis from kidney to other site [facility] [place])   . Kidney stone   . OSA (obstructive sleep apnea)    pt does not wear cpap at night  . Pituitary cyst ([place])   . Pneumonia    hx of  . Prostate cancer [facility])    Past Surgical History:  Past Surgical History:  Procedure Laterality Date  . ABLATION OF DYSRHYTHMIC FOCUS  01/28/2018  . ANTERIOR CERVICAL DECOMP/DISCECTOMY FUSION N/A 08/29/2012   Procedure: ANTERIOR CERVICAL DECOMPRESSION/DISCECTOMY FUSION 1 LEVEL;  Surgeon: Eustace Moore, MD;  Location: [facility];  Service: Neurosurgery;  Laterality: N/A;  . APPENDECTOMY    . ATRIAL FIBRILLATION ABLATION N/A 01/28/2018   Procedure: ATRIAL FIBRILLATION ABLATION;  Surgeon: Constance Haw, MD;  Location: [place] CV LAB;  Service: Cardiovascular;  Laterality: N/A;  . CARDIAC CATHETERIZATION  2008   clean  . CHOLECYSTECTOMY    . COLONOSCOPY W/ POLYPECTOMY    . CRANIOTOMY N/A 11/08/2012   Procedure: CRANIOTOMY HYPOPHYSECTOMY TRANSNASAL APPROACH;  Surgeon: Faythe Ghee, MD;  Location: [place] [facility];  Service: Neurosurgery;  Laterality: N/A;  Transphenoidal Resection of Pituitary Tumor  . FINGER SURGERY Left 2017   Dr Amedeo Plenty  . INSERTION PROSTATE RADIATION SEED    . KIDNEY STONE SURGERY    . KNEE ARTHROSCOPY  2007   left  .  NEPHRECTOMY Left   . POSTERIOR CERVICAL LAMINECTOMY Left 04/24/2015   Procedure: Foraminotomy cervical five - cervical six cervical six - cervical seven left;  Surgeon: Eustace Moore, MD;  Location: [facility];  Service: Neurosurgery;  Laterality: Left;  . REPLACEMENT TOTAL KNEE BILATERAL      Assessment & Plan Clinical Impression: Patient is a 77 y.o. year old male with history of atrial fibrillation status post ablation maintained on Eliquis, sleep apnea,  COPD, CKD stage III, history of pulmonary emboli, hypertension. Per chart review patient lives with spouse and independent prior to admission with occasional use of a cane. Two-level home with bedroom on [address]. Presented 02/03/2018 with increasing shortness of breath and decreased mobility over a 5-day span as well as associated right-sided pleuritic chest pain. Chest x-ray showed right lung mid lung zone consolidation concerning for pneumonia. Perfusion scan low probability for pulmonary emboli. CT of the chest showed bilateral multilobar pneumonia involving the right upper, middle and both lower lobes. Mild hyponatremia 131, troponin 0.12, lactic acid 1.51. Placed on broad-spectrum antibiotics for suspect sepsis. Echocardiogram with ejection fraction of 55% no wall motion abnormalities. Patient did require intubation for acute respiratory failure 02/12/2018 until 02/17/2018. Persistent leukocytosis 23,600 improved until 19,500 infectious disease consulted antibiotic course completed advised to continue to monitor. Recent blood cultures negative. MRSA PCR screening positive with contact precautions. Therapy evaluations completed with recommendations of physical medicine rehab consult. Patient was admitted for a comprehensive rehab program..  Patient transferred to CIR on 02/26/2018 .   Patient currently requires mod with mobility secondary to muscle weakness, decreased cardiorespiratoy endurance and decreased standing balance and decreased balance strategies.  Prior to hospitalization, patient was independent  with mobility and lived with Spouse, Son in a House home.  Home access is  Level entry.  Patient will benefit from skilled PT intervention to maximize safe functional mobility, minimize fall risk and decrease caregiver burden for planned discharge home with intermittent supervision.  Anticipate patient will benefit from follow up [place] at discharge.  PT - End of Session Activity Tolerance: Tolerates 30+ min  activity with multiple rests Endurance Deficit: Yes Endurance Deficit Description: shortness of breath with activity PT Assessment Rehab Potential (ACUTE/IP ONLY): Good PT Patient demonstrates impairments in the following area(s): Balance;Endurance;Behavior;Motor;Pain PT Transfers Functional Problem(s): Bed Mobility;Bed to Chair;Car;Furniture;Floor PT Locomotion Functional Problem(s): Ambulation;Wheelchair Mobility;Stairs PT Plan PT Intensity: Minimum of 1-2 x/day ,45 to 90 minutes PT Frequency: 5 out of 7 days PT Duration Estimated Length of Stay: 7-9 days PT Treatment/Interventions: Ambulation/gait training;Disease management/prevention;Pain management;Stair training;Balance/vestibular training;DME/adaptive equipment instruction;Patient/family education;Therapeutic Activities;Wheelchair propulsion/positioning;Therapeutic Exercise;Functional mobility training;UE/LE Strength taining/ROM;Discharge planning;UE/LE Coordination activities;Neuromuscular re-education PT Transfers Anticipated Outcome(s): Mod I PT Locomotion Anticipated Outcome(s): supervision PT Recommendation Follow Up Recommendations: Home health PT Patient destination: Home Equipment Recommended: To be determined  Skilled Therapeutic Intervention Evaluation completed (see details above and below) with education on PT POC and goals and individual treatment initiated with focus on functional mobility, transfers, endurance and stairs. Pt seated in recliner upon PT arrival, agreeable to therapy tx and denies pain. Pt performed sit<>stand from recliner with RW and mod assist, stand pivot to the w/c with mod assist and verbal cues for techniques. Pt transferred from room>gym in w/c. Pt performed car transfer with RW and mod assist. Pt transferred to the gym, ambulated x 50 ft with RW and min assist, increased work of breathing after activity and pt reports some soreness in quads. Pt ascended/descended 1 step (6 inches) x 5 with  B  handrails and min assist for LE strengthening, heavy reliance on UEs. Pt performed bed mobility on the mat with min assist, verbal cues for techniques. Pt transported back to room and requesting to use bathroom. Ambulated into bathroom x 10 ft with RW and min assist, left seated on toilet with call bell in reach and NT aware.    PT Evaluation Precautions/Restrictions Precautions Precautions: Fall Precaution Comments: watch SpO2 Restrictions Weight Bearing Restrictions: No General   Vital SignsOxygen Therapy SpO2: 98 % O2 Device: Room Air Pain  denies pain  Home Living/Prior Functioning Home Living Available Help at Discharge: Family;Available 24 hours/day Type of Home: House Home Access: Level entry Home Layout: Two level;Able to live on main level with bedroom/bathroom Alternate Level Stairs-Number of Steps: 12 Alternate Level Stairs-Rails: Right  Lives With: Spouse;Son Prior Function Level of Independence: Independent with gait;Independent with basic ADLs  Able to Take Stairs?: Yes Driving: Yes Vocation: Retired Leisure: Hobbies-yes (Comment) Comments: enjoys fishing Cognition Arousal/Alertness: Awake/alert Orientation Level: Oriented X4 Sensation Sensation Light Touch: Appears Intact Proprioception: Appears Intact Additional Comments: sensation grossly intact B LEs Coordination Gross Motor Movements are Fluid and Coordinated: Yes Fine Motor Movements are Fluid and Coordinated: Yes Heel Shin Test: WFL bilaterally  Motor  Motor Motor - Skilled Clinical Observations: generalized weakness  Mobility Bed Mobility Bed Mobility: Supine to Sit;Sit to Supine Supine to Sit: Minimal Assistance - Patient > 75% Sit to Supine: Minimal Assistance - Patient > 75% Transfers Transfers: Sit to [employer] Transfers Sit to Stand: Moderate Assistance - Patient 50-74% Stand Pivot Transfers: Moderate Assistance - Patient 50 - 74% Stand Pivot Transfer Details: Verbal cues for  safe use of DME/AE;Verbal cues for technique Transfer (Assistive device): Rolling walker Locomotion  Gait Ambulation: Yes Gait Assistance: Minimal Assistance - Patient > 75% Gait Distance (Feet): 50 Feet Assistive device: Rolling walker Gait Gait velocity: slowed  Trunk/Postural Assessment  Cervical Assessment Cervical Assessment: Exceptions to WFL(forward head) Thoracic Assessment Thoracic Assessment: Exceptions to WFL(rounded shoulders, kyphotic) Lumbar Assessment Lumbar Assessment: Within Functional Limits Postural Control Postural Control: Within Functional Limits  Balance Balance Balance Assessed: Yes Static Sitting Balance Static Sitting - Level of Assistance: 5: Stand by assistance Dynamic Sitting Balance Dynamic Sitting - Level of Assistance: 5: Stand by assistance Static Standing Balance Static Standing - Level of Assistance: 4: Min assist Dynamic Standing Balance Dynamic Standing - Level of Assistance: 3: Mod assist Extremity Assessment  RLE Assessment RLE Assessment: Exceptions to [facility] Active Range of Motion (AROM) Comments: WFL, previous TKA General Strength Comments: grossly 4/5 throughout LLE Assessment LLE Assessment: Exceptions to [facility] Active Range of Motion (AROM) Comments: WFL, previous TKA General Strength Comments: grossly 4/5 throughout   See Function Navigator for Current Functional Status.   Refer to Care Plan for Long Term Goals  Recommendations for other services: None   Discharge Criteria: Patient will be discharged from PT if patient refuses treatment 3 consecutive times without medical reason, if treatment goals not met, if there is a change in medical status, if patient makes no progress towards goals or if patient is discharged from hospital.  The above assessment, treatment plan, treatment alternatives and goals were discussed and mutually agreed upon: by patient  Netta Corrigan, PT, DPT  02/27/2018, 9:05 AM

## 2018-02-27 NOTE — Progress Notes (Signed)
RT asked pt if he would like to use one of our BIPAPs and he declined stating he preferred to wait until his wife brings his BIPAP from home tomorrow to use. RT offered to bring one later if he feels like he may need, pt declined. RT will continue to monitor.

## 2018-02-27 NOTE — Progress Notes (Signed)
[facility] PHYSICAL MEDICINE & REHABILITATION     PROGRESS NOTE    Subjective/Complaints: Had a reasonable night. Feet still sore.  ROS: Patient denies fever, rash, sore throat, blurred vision, nausea, vomiting, diarrhea, cough, shortness of breath or chest pain, joint or back pain, headache, or mood change.      Objective:  No results found. Recent Labs    02/25/18 0158  WBC 19.5*  HGB 14.2  HCT 44.5  PLT 344   Recent Labs    02/25/18 0158  NA 135  K 4.0  CL 99  GLUCOSE 165*  BUN 39*  CREATININE 1.64*  CALCIUM 9.0   CBG (last 3)  No results for input(s): GLUCAP in the last 72 hours.  Wt Readings from Last 3 Encounters:  02/27/18 117.1 kg  02/25/18 118.6 kg  01/31/18 (!) 136.4 kg     Intake/Output Summary (Last 24 hours) at 02/27/2018 0820 Last data filed at 02/27/2018 0803 Gross per 24 hour  Intake 1317 ml  Output 2550 ml  Net -1233 ml    Vital Signs: Blood pressure 138/85, pulse 89, temperature 97.7 F (36.5 C), temperature source Oral, resp. rate 20, height 5\' 10"  (1.778 m), weight 117.1 kg, SpO2 98 %. Physical Exam:  Constitutional: No distress . Vital signs reviewed. obese HEENT: EOMI, oral membranes moist Neck: supple Cardiovascular: IRR IRR without murmur. No JVD    Respiratory: CTA Bilaterally without wheezes or rales. Normal effort    GI: BS +, non-tender, non-distended  Musculoskeletal: He exhibits edema (trace LE edema). Persistent pain over bilateral forefeet with palpation, no warmth or redness   Neurological: He is alert and oriented to person, place, and time. No cranial nerve deficit. Coordination normal.  UE 4- deltoid, 4/5 Biceps and triceps, 5/5 Grip/wrist. LE: 3+/5 HF, KE and 4/5 ADF/PF.  Skin: Skin is warm and dry.  Psychiatric: pleasant and appropraite.   Assessment/Plan: 1. Functional deficits secondary to debility which require 3+ hours per day of interdisciplinary therapy in a comprehensive inpatient rehab  setting. Physiatrist is providing close team supervision and 24 hour management of active medical problems listed below. Physiatrist and rehab team continue to assess barriers to discharge/monitor patient progress toward functional and medical goals.  Function:  Bathing Bathing position      Bathing parts      Bathing assist        Upper Body Dressing/Undressing Upper body dressing                    Upper body assist        Lower Body Dressing/Undressing Lower body dressing                                  Lower body assist        Toileting Toileting   Toileting steps completed by patient: Adjust clothing prior to toileting, Performs perineal hygiene, Adjust clothing after toileting      Toileting assist Assist level: Touching or steadying assistance (Pt.75%)   Transfers Chair/bed transfer             Locomotion Ambulation           Wheelchair          Cognition Comprehension Comprehension assist level: Follows basic conversation/direction with no assist  Expression Expression assist level: Expresses basic needs/ideas: With no assist  Social Interaction Social Interaction assist level: Interacts appropriately with others  with medication or extra time (anti-anxiety, antidepressant).  Problem Solving Problem solving assist level: Solves basic 90% of the time/requires cueing < 10% of the time  Memory Memory assist level: Recognizes or recalls 90% of the time/requires cueing < 10% of the time  Medical Problem List and Plan:  1. Debility secondary to pneumonia/respiratory failure with resolving sepsis.  -beginning therapies today  2. DVT Prophylaxis/Anticoagulation: Chronic Eliquis  3. Pain Management: Tylenol as needed, rx gout as below  4. Mood: Lexapro 20 mg daily, Pamelor 75 mg nightly  5. Neuropsych: This patient is capable of making decisions on his own behalf.  6. Skin/Wound Care: Routine skin checks  7.  Fluids/Electrolytes/Nutrition: Routine in and outs with follow-up chemistries for Monday  8. Atrial fibrillation status post ablation. Continue Eliquis. Cardiac rate controlled  9. CKD stage III. Monitor labs, I's and O's  10. MRSA PCR screening positive. Contact precautions  11. Leukocytosis. Improving. Antibiotic therapy completed.Recent blood cultures negative. Follow-up infectious disease as needed  12. Hypothyroidism. Synthroid  13. Obesity. BMI 38.75. Dietary follow-up  14. Hx of gout, ?flare in forefeet.  -brief course of prednisone/taper   LOS (Days) [place] EVALUATION WAS PERFORMED  Meredith Staggers, MD 02/27/2018 8:20 AM

## 2018-02-27 NOTE — Evaluation (Signed)
Occupational Therapy Assessment and Plan  Patient Details  Name: Wayne Booth MRN: [id number] Date of Birth: [birth year]  OT Diagnosis: abnormal posture, muscle weakness (generalized) and coordination disorder Rehab Potential:   ELOS: 7-9   Today's Date: 02/27/2018 OT Individual Time: 1100-1200 OT Individual Time Calculation (min): 60 min     Problem List:  Patient Active Problem List   Diagnosis Date Noted  . Debility 02/26/2018  . Pneumonia due to methicillin resistant Staphylococcus aureus (MRSA) ([place])   . Dyspnea   . Pneumonia of right middle lobe due to infectious organism ([place])   . Acute on chronic systolic and diastolic heart failure, NYHA class 2 ([place])   . HCAP (healthcare-associated pneumonia) 02/03/2018  . Pericardial effusion   . SOB (shortness of breath)   . Elevated troponin   . Respiratory failure ([place]) 02/02/2018  . Paroxysmal atrial fibrillation ([place]) 01/28/2018  . Constipation 01/21/2018  . (HFpEF) heart failure with preserved ejection fraction ([place]) 12/16/2017  . Chest pain   . Anticoagulated   . Atrial flutter ([place]) 11/01/2017  . Bilateral foot pain 07/13/2017  . Community acquired pneumonia 06/18/2017  . Shortness of breath 06/14/2017  . History of adenomatous polyp of colon 04/12/2017  . History of pulmonary embolism 10/23/2016  . Cervical disc disease 11/13/2015  . Essential tremor 08/14/2015  . Gout 08/14/2015  . Hypothyroidism 07/12/2014  . Hyperglycemia 07/12/2014  . GAD (generalized anxiety disorder) 11/07/2013  . OSA (obstructive sleep apnea) 07/07/2011  . Asthmatic bronchitis 06/09/2011  . Actinic keratosis 11/27/2009  . Osteoarthritis 11/13/2008  . 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 Morbid obesity ([place]) 10/02/2008  . History of renal cell carcinoma 12/26/2007  . TESTOSTERONE DEFICIENCY 12/28/2006  . Essential hypertension 12/28/2006    Past Medical History:  Past Medical History:  Diagnosis Date  . Allergy    States his nose  runs when he is around grease.   Marland Kitchen Anxiety   . Arthritis   . Atrial fibrillation ([place])   . Cancer ([place])   . Coronary artery disease   . History of total knee replacement   . Hypertension   . Hypothyroidism   . Kidney cancer, primary, with metastasis from kidney to other site [facility])   . Kidney stone   . OSA (obstructive sleep apnea)    pt does not wear cpap at night  . Pituitary cyst ([place])   . Pneumonia    hx of  . Prostate cancer [facility])    Past Surgical History:  Past Surgical History:  Procedure Laterality Date  . ABLATION OF DYSRHYTHMIC FOCUS  01/28/2018  . ANTERIOR CERVICAL DECOMP/DISCECTOMY FUSION N/A 08/29/2012   Procedure: ANTERIOR CERVICAL DECOMPRESSION/DISCECTOMY FUSION 1 LEVEL;  Surgeon: Eustace Moore, MD;  Location: [place] NEURO ORS;  Service: Neurosurgery;  Laterality: N/A;  . APPENDECTOMY    . ATRIAL FIBRILLATION ABLATION N/A 01/28/2018   Procedure: ATRIAL FIBRILLATION ABLATION;  Surgeon: Constance Haw, MD;  Location: [place] CV LAB;  Service: Cardiovascular;  Laterality: N/A;  . CARDIAC CATHETERIZATION  2008   clean  . CHOLECYSTECTOMY    . COLONOSCOPY W/ POLYPECTOMY    . CRANIOTOMY N/A 11/08/2012   Procedure: CRANIOTOMY HYPOPHYSECTOMY TRANSNASAL APPROACH;  Surgeon: Faythe Ghee, MD;  Location: [place] NEURO ORS;  Service: Neurosurgery;  Laterality: N/A;  Transphenoidal Resection of Pituitary Tumor  . FINGER SURGERY Left 2017   Dr Amedeo Plenty  . INSERTION PROSTATE RADIATION SEED    . KIDNEY STONE SURGERY    . KNEE ARTHROSCOPY  2007  left  . NEPHRECTOMY Left   . POSTERIOR CERVICAL LAMINECTOMY Left 04/24/2015   Procedure: Foraminotomy cervical five - cervical six cervical six - cervical seven left;  Surgeon: Eustace Moore, MD;  Location: [facility] NEURO ORS;  Service: Neurosurgery;  Laterality: Left;  . REPLACEMENT TOTAL KNEE BILATERAL      Assessment & Plan Clinical Impression: 77 year old right-handed male with history of atrial fibrillation status post ablation  maintained on Eliquis, sleep apnea, COPD, CKD stage III, history of pulmonary emboli, hypertension. Per chart review patient lives with spouse and independent prior to admission with occasional use of a cane. Two-level home with bedroom on [address]. Presented 02/03/2018 with increasing shortness of breath and decreased mobility over a 5-day span as well as associated right-sided pleuritic chest pain. Chest x-ray showed right lung mid lung zone consolidation concerning for pneumonia. Perfusion scan low probability for pulmonary emboli. CT of the chest showed bilateral multilobar pneumonia involving the right upper, middle and both lower lobes. Mild hyponatremia 131, troponin 0.12, lactic acid 1.51. Placed on broad-spectrum antibiotics for suspect sepsis. Echocardiogram with ejection fraction of 55% no wall motion abnormalities. Patient did require intubation for acute respiratory failure 02/12/2018 until 02/17/2018. Persistent leukocytosis 23,600 improved until 19,500 infectious disease consulted antibiotic course completed advised to continue to monitor. Recent blood cultures negative. MRSA PCR screening positive with contact precautions. Therapy evaluations completed with recommendations of physical medicine rehab consult. Patient was admitted for a comprehensive rehab program27 year old right-handed male with history of atrial fibrillation status post ablation maintained on Eliquis, sleep apnea, COPD, CKD stage III, history of pulmonary emboli, hypertension. Per chart review patient lives with spouse and independent prior to admission with occasional use of a cane. Two-level home with bedroom on [address]. Presented 02/03/2018 with increasing shortness of breath and decreased mobility over a 5-day span as well as associated right-sided pleuritic chest pain. Chest x-ray showed right lung mid lung zone consolidation concerning for pneumonia. Perfusion scan low probability for pulmonary emboli. CT of the chest showed bilateral  multilobar pneumonia involving the right upper, middle and both lower lobes. Mild hyponatremia 131, troponin 0.12, lactic acid 1.51. Placed on broad-spectrum antibiotics for suspect sepsis. Echocardiogram with ejection fraction of 55% no wall motion abnormalities. Patient did require intubation for acute respiratory failure 02/12/2018 until 02/17/2018. Persistent leukocytosis 23,600 improved until 19,500 infectious disease consulted antibiotic course completed advised to continue to monitor. Recent blood cultures negative. MRSA PCR screening positive with contact precautions. Therapy evaluations completed with recommendations of physical medicine rehab consult. Patient was admitted for a comprehensive rehab program   Patient currently requires min with basic self-care skills secondary to muscle weakness, decreased cardiorespiratoy endurance, decreased coordination, decreased safety awareness and decreased standing balance and decreased balance strategies.  Prior to hospitalization, patient could complete BADL with independent .  Patient will benefit from skilled intervention to increase independence with basic self-care skills prior to discharge home with care partner.  Anticipate patient will require 24 hour supervision and follow up home health .  OT - End of Session Activity Tolerance: Tolerates 30+ min activity with multiple rests Endurance Deficit: Yes Endurance Deficit Description: shortness of breath with activity OT Assessment Rehab Potential (ACUTE ONLY): Good OT Patient demonstrates impairments in the following area(s): Balance;Cognition;Endurance;Pain;Safety OT Basic ADL's Functional Problem(s): Grooming;Bathing;Dressing;Toileting OT Advanced ADL's Functional Problem(s): Simple Meal Preparation OT Transfers Functional Problem(s): Toilet;Tub/Shower OT Plan OT Intensity: Minimum of 1-2 x/day, 45 to 90 minutes OT Frequency: 5 out of 7 days OT Duration/Estimated Length of  Stay: 7-9 OT  Treatment/Interventions: Balance/vestibular training;Discharge planning;Self Care/advanced ADL retraining;Pain management;Therapeutic Activities;UE/LE Coordination activities;Cognitive remediation/compensation;Disease mangement/prevention;Functional mobility training;Patient/family education;Skin care/wound managment;Therapeutic Exercise;Community reintegration;DME/adaptive equipment instruction;Neuromuscular re-education;Psychosocial support;Splinting/orthotics;UE/LE Strength taining/ROM OT Self Feeding Anticipated Outcome(s): no goal OT Basic Self-Care Anticipated Outcome(s): MOD I  OT Toileting Anticipated Outcome(s): MOD I  OT Bathroom Transfers Anticipated Outcome(s): MOD I toilet, S shower OT Recommendation Patient destination: Home Follow Up Recommendations: Home health OT Equipment Recommended: Tub/shower seat   Skilled Therapeutic Intervention 1:1. Pt educated on role/purpose of OT, CIR, ELOS and POC. Pt with c/o gout pain. RN aware and pt willing to participate in tx as tolerated. Pt ambulates throughout session with min A for balance and mod A sit to stand from low surfaces for power up. Pt requires VC for safety awareness, hand placement on RW and RW management for turns/transfers. Pt toilets with increased time to have BM/bladder on toilet. Pt able to manage clothing in standing with min A for steadying. Pt bathes sit to stand with A to wash B feet and back. Pt completes dressing sit to stand from w/c in bathroom with A to don B non slip socks, thread L pant leg, and steadying while standing advancing pants past hips. Pt completes donning shirt with A to pull down back. In ADL apartment pt instructed in posterior method of transfer into walk in shower with RW. Pt able to return demo with min A. Pt set up with lunch and family present to supervise while seated in w/c.  OT Evaluation Precautions/Restrictions  Precautions Precautions: Fall Precaution Comments: watch  SpO2 Restrictions Weight Bearing Restrictions: No General Chart Reviewed: Yes Vital Signs Oxygen Therapy SpO2: 98 % O2 Device: Room Air Pain Pain Assessment Pain Score: 0-No pain Home Living/Prior Functioning Home Living Available Help at Discharge: Family, Available 24 hours/day Type of Home: House Home Access: Level entry Home Layout: Two level, Able to live on main level with bedroom/bathroom Alternate Level Stairs-Number of Steps: 12 Alternate Level Stairs-Rails: Right Bathroom Shower/Tub: Multimedia programmer: Standard Bathroom Accessibility: Yes  Lives With: Spouse, Son IADL History Homemaking Responsibilities: Yes Meal Prep Responsibility: Secondary Laundry Responsibility: Secondary Cleaning Responsibility: Secondary Bill Paying/Finance Responsibility: Secondary Shopping Responsibility: Secondary Child Care Responsibility: Secondary Current License: Yes Mode of Transportation: Car Occupation: Retired Type of Occupation: made Event organiser Leisure and Hobbies: fishing  Prior Function Level of Independence: Independent with gait, Independent with basic ADLs  Able to Take Stairs?: Yes Driving: Yes Vocation: Retired Leisure: Hobbies-yes (Comment) Comments: enjoys fishing ADL   Vision Baseline Vision/History: No visual deficits Patient Visual Report: No change from baseline Vision Assessment?: No apparent visual deficits Perception    WFL Praxis   WFL Cognition Overall Cognitive Status: Impaired/Different from baseline Arousal/Alertness: Awake/alert Orientation Level: Person;Place;Situation Person: Oriented Place: Oriented Situation: Oriented Year: 2019 Month: September Day of Week: Correct Memory: Appears intact Immediate Memory Recall: Sock;Blue;Bed Memory Recall: Blue;Sock Memory Recall Sock: With Cue Memory Recall Blue: Without Cue Attention: Selective Selective Attention: Impaired Sensation Sensation Light Touch: Appears  Intact Proprioception: Appears Intact Additional Comments: sensation grossly intact B LEs Coordination Gross Motor Movements are Fluid and Coordinated: Yes Fine Motor Movements are Fluid and Coordinated: No Heel Shin Test: WFL bilaterally  Motor  Motor Motor - Skilled Clinical Observations: generalized weakness Mobility  Bed Mobility Bed Mobility: Supine to Sit;Sit to Supine Supine to Sit: Minimal Assistance - Patient > 75% Sit to Supine: Minimal Assistance - Patient > 75% Transfers Sit to Stand: Moderate Assistance - Patient 50-74%  Trunk/Postural Assessment  Cervical Assessment Cervical Assessment: Exceptions to WFL(head foreward) Thoracic Assessment Thoracic Assessment: Exceptions to WFL(rounded shoulders) Lumbar Assessment Lumbar Assessment: Within Functional Limits Postural Control Postural Control: Within Functional Limits  Balance Balance Balance Assessed: Yes Static Sitting Balance Static Sitting - Level of Assistance: 5: Stand by assistance Dynamic Sitting Balance Dynamic Sitting - Level of Assistance: 5: Stand by assistance Static Standing Balance Static Standing - Level of Assistance: 4: Min assist Dynamic Standing Balance Dynamic Standing - Level of Assistance: 3: Mod assist Extremity/Trunk Assessment RUE Assessment RUE Assessment: Exceptions to [facility] General Strength Comments: 4+/5, tremors/ataxic pt reports PTA LUE Assessment LUE Assessment: Exceptions to [facility] Active Range of Motion (AROM) Comments: generalized weakness, decreased coordination/FMC   See Function Navigator for Current Functional Status.   Refer to Care Plan for Long Term Goals  Recommendations for other services: Therapeutic Recreation  Pet therapy, Kitchen group and Outing/community reintegration   Discharge Criteria: Patient will be discharged from OT if patient refuses treatment 3 consecutive times without medical reason, if treatment goals not met, if there is a change in medical  status, if patient makes no progress towards goals or if patient is discharged from hospital.  The above assessment, treatment plan, treatment alternatives and goals were discussed and mutually agreed upon: by patient and by family  Tonny Branch 02/27/2018, 11:18 AM

## 2018-02-28 ENCOUNTER — Inpatient Hospital Stay (HOSPITAL_COMMUNITY): Payer: Medicare Other | Admitting: Physical Therapy

## 2018-02-28 ENCOUNTER — Inpatient Hospital Stay (HOSPITAL_COMMUNITY): Payer: Medicare Other | Admitting: Occupational Therapy

## 2018-02-28 DIAGNOSIS — N183 Chronic kidney disease, stage 3 unspecified: Secondary | ICD-10-CM

## 2018-02-28 DIAGNOSIS — R739 Hyperglycemia, unspecified: Secondary | ICD-10-CM

## 2018-02-28 DIAGNOSIS — T380X5A Adverse effect of glucocorticoids and synthetic analogues, initial encounter: Secondary | ICD-10-CM

## 2018-02-28 DIAGNOSIS — D72829 Elevated white blood cell count, unspecified: Secondary | ICD-10-CM

## 2018-02-28 DIAGNOSIS — N182 Chronic kidney disease, stage 2 (mild): Secondary | ICD-10-CM

## 2018-02-28 LAB — COMPREHENSIVE METABOLIC PANEL
ALBUMIN: 2.8 g/dL — AB (ref 3.5–5.0)
ALT: 103 U/L — AB (ref 0–44)
AST: 64 U/L — AB (ref 15–41)
Alkaline Phosphatase: 81 U/L (ref 38–126)
Anion gap: 11 (ref 5–15)
BUN: 22 mg/dL (ref 8–23)
CALCIUM: 9.1 mg/dL (ref 8.9–10.3)
CO2: 24 mmol/L (ref 22–32)
CREATININE: 1.46 mg/dL — AB (ref 0.61–1.24)
Chloride: 101 mmol/L (ref 98–111)
GFR calc Af Amer: 52 mL/min — ABNORMAL LOW (ref >60)
GFR, EST NON AFRICAN AMERICAN: 45 mL/min — AB (ref >60)
GLUCOSE: 138 mg/dL — AB (ref 70–99)
POTASSIUM: 4.5 mmol/L (ref 3.5–5.1)
SODIUM: 136 mmol/L (ref 135–145)
Total Bilirubin: 0.9 mg/dL (ref 0.3–1.2)
Total Protein: 6.7 g/dL (ref 6.5–8.1)

## 2018-02-28 LAB — CBC WITH DIFFERENTIAL/PLATELET
Abs Immature Granulocytes: 0.2 10*3/uL — ABNORMAL HIGH (ref 0.0–0.1)
BASOS PCT: 0 %
Basophils Absolute: 0.1 10*3/uL (ref 0.0–0.1)
EOS ABS: 0.6 10*3/uL (ref 0.0–0.7)
EOS PCT: 3 %
HEMATOCRIT: 46.3 % (ref 39.0–52.0)
Hemoglobin: 14.6 g/dL (ref 13.0–17.0)
Immature Granulocytes: 1 %
Lymphocytes Relative: 12 %
Lymphs Abs: 2.3 10*3/uL (ref 0.7–4.0)
MCH: 29.1 pg (ref 26.0–34.0)
MCHC: 31.5 g/dL (ref 30.0–36.0)
MCV: 92.4 fL (ref 78.0–100.0)
MONO ABS: 1.3 10*3/uL — AB (ref 0.1–1.0)
MONOS PCT: 7 %
NEUTROS PCT: 77 %
Neutro Abs: 14.7 10*3/uL — ABNORMAL HIGH (ref 1.7–7.7)
PLATELETS: 342 10*3/uL (ref 150–400)
RBC: 5.01 MIL/uL (ref 4.22–5.81)
RDW: 15.9 % — AB (ref 11.5–15.5)
WBC: 19.2 10*3/uL — ABNORMAL HIGH (ref 4.0–10.5)

## 2018-02-28 MED ORDER — TRAZODONE HCL 50 MG PO TABS
25.0000 mg | ORAL_TABLET | Freq: Every evening | ORAL | Status: DC | PRN
  Administered 2018-02-28 – 2018-03-04 (×4): 50 mg via ORAL
  Filled 2018-02-28 (×4): qty 1

## 2018-02-28 NOTE — Progress Notes (Signed)
Physical Therapy Session Note  Patient Details  Name: Wayne Booth MRN: [id number] Date of Birth: March 12, 1941  Today's Date: 02/28/2018  PT Individual Time:  - 20 - 900 PT Individual Time Calculation (min): 55 min       Short Term Goals: Week 1:  PT Short Term Goal 1 (Week 1): STG=LTG due to ELOS  Skilled Therapeutic Interventions/Progress Updates: Pt presented in chair agreeable to therapy. Pt ambulated to rehab gym with RW and x 1 brief standing rest break. Pt participated in standing activities for endurance including horseshoes without AD and with single LE on 2in step. Pt required extended rest break after game due to fatigue. Pt participated in LE therex including AROM LAQ 2x10, hip flexion with 1.5#, hamstring curls with level 2 resistance band  2x10 ea, standing hip abd/add 1.5# 2 x 10. Pt c/o ms soreness prior to therex which decreased with activity. Pt performed stand pivot to w/c CGA and transported to day room. Performed stand pivot to NuStep CGA and performed x 5 min L2 for endurance and strengthening. Pt returned to w/c and transported back to room. Pt left with call bell within reach and needs met.      Therapy Documentation Precautions:  Precautions Precautions: Fall Precaution Comments: watch SpO2 Restrictions Weight Bearing Restrictions: No General:   Vital Signs: Oxygen Therapy SpO2: 98 % O2 Device: Room Air Pain: Pain Assessment Pain Score: 0-No pain Mobility:   Locomotion :    Trunk/Postural Assessment :    Balance:   Exercises:   Other Treatments:     See Function Navigator for Current Functional Status.   Therapy/Group: Individual Therapy  Oprah Camarena 03/01/2018, 1:02 PM

## 2018-02-28 NOTE — Progress Notes (Addendum)
[place] PHYSICAL MEDICINE & REHABILITATION     PROGRESS NOTE    Subjective/Complaints: He did not sleep well overnight, but cannot identify any particular reason why. He states he had a good weekend.  ROS: Denies CP, SOB, N/V/D   Objective:  No results found. No results for input(s): WBC, HGB, HCT, PLT in the last 72 hours. Recent Labs    02/28/18 0529  NA 136  K 4.5  CL 101  GLUCOSE 138*  BUN 22  CREATININE 1.46*  CALCIUM 9.1   CBG (last 3)  No results for input(s): GLUCAP in the last 72 hours.  Wt Readings from Last 3 Encounters:  02/27/18 117.1 kg  02/25/18 118.6 kg  01/31/18 (!) 136.4 kg     Intake/Output Summary (Last 24 hours) at 02/28/2018 0759 Last data filed at 02/27/2018 1803 Gross per 24 hour  Intake 1400 ml  Output -  Net 1400 ml    Vital Signs: Blood pressure 124/75, pulse 88, temperature 97.8 F (36.6 C), temperature source Oral, resp. rate 19, height 5\' 10"  (1.778 m), weight 117.1 kg, SpO2 99 %. Physical Exam:  Constitutional: No distress . Vital signs reviewed. HENT: Normocephalic.  Atraumatic. Eyes: EOMI. No discharge. Cardiovascular: irregularly irregular. No JVD. Respiratory: CTA Bilaterally. Normal effort. GI: BS +. Non-distended. Musculoskeletal: He exhibits LE edema  Neurological: He is alert and oriented Motor: B/l UE 4/5  B/l LE: 4/5 HF, KE and 4/5 ADF/PF.  Skin: Skin is warm and dry.  Psychiatric: impulsive  Assessment/Plan: 1. Functional deficits secondary to debility which require 3+ hours per day of interdisciplinary therapy in a comprehensive inpatient rehab setting. Physiatrist is providing close team supervision and 24 hour management of active medical problems listed below. Physiatrist and rehab team continue to assess barriers to discharge/monitor patient progress toward functional and medical goals.  Function:  Bathing Bathing position      Bathing parts Body parts bathed by patient: Right arm, Left arm, Chest,  Abdomen, Front perineal area, Buttocks, Right upper leg, Left upper leg Body parts bathed by helper: Right lower leg, Left lower leg, Back  Bathing assist Assist Level: Touching or steadying assistance(Pt > 75%)      Upper Body Dressing/Undressing Upper body dressing   What is the patient wearing?: Pull over shirt/dress     Pull over shirt/dress - Perfomed by patient: Thread/unthread right sleeve, Put head through opening, Thread/unthread left sleeve Pull over shirt/dress - Perfomed by helper: Pull shirt over trunk        Upper body assist        Lower Body Dressing/Undressing Lower body dressing   What is the patient wearing?: Pants, Non-skid slipper socks     Pants- Performed by patient: Thread/unthread right pants leg, Pull pants up/down Pants- Performed by helper: Thread/unthread left pants leg   Non-skid slipper socks- Performed by helper: Don/doff right sock, Don/doff left sock                  Lower body assist Assist for lower body dressing: Touching or steadying assistance (Pt > 75%)      Toileting Toileting   Toileting steps completed by patient: Adjust clothing prior to toileting, Performs perineal hygiene, Adjust clothing after toileting      Toileting assist Assist level: Touching or steadying assistance (Pt.75%)   Transfers Chair/bed transfer   Chair/bed transfer method: Stand pivot Chair/bed transfer assist level: Moderate assist (Pt 50 - 74%/lift or lower) Chair/bed transfer assistive device: Armrests, Environmental consultant  Locomotion Ambulation       Assist level: Touching or steadying assistance (Pt > 75%)   Wheelchair          Cognition Comprehension Comprehension assist level: Follows basic conversation/direction with no assist  Expression Expression assist level: Expresses basic needs/ideas: With no assist  Social Interaction Social Interaction assist level: Interacts appropriately with others with medication or extra time (anti-anxiety,  antidepressant).  Problem Solving Problem solving assist level: Solves basic 90% of the time/requires cueing < 10% of the time  Memory Memory assist level: Recognizes or recalls 90% of the time/requires cueing < 10% of the time   Medical Problem List and Plan:  1. Debility secondary to pneumonia/respiratory failure with resolving sepsis.   Cont CIR  Notes reviewed - debility, labs reviewed 2. DVT Prophylaxis/Anticoagulation: Chronic Eliquis  3. Pain Management: Tylenol as needed, rx gout as below  4. Mood: Lexapro 20 mg daily, Pamelor 75 mg nightly  5. Neuropsych: This patient is ?fully capable of making decisions on his own behalf.  6. Skin/Wound Care: Routine skin checks  7. Fluids/Electrolytes/Nutrition: Routine in and outs  8. Atrial fibrillation status post ablation. Continue Eliquis. Cardiac rate controlled  9. CKD stage III. Monitor labs, I's and O's   AKI  Resolving, creatinine 1.46 on 9/2  Continue to monitor 10. MRSA PCR screening positive. Contact precautions  11. Leukocytosis. Antibiotic therapy completed.Recent blood cultures negative. Follow-up infectious disease as needed   WBC is 19.2 on 9/2  Afebrile  Confounded by steroids 12. Hypothyroidism. Synthroid  13. Obesity. BMI 38.75. Dietary follow-up  14. Hx of gout, ?flare in forefeet.  -prednisone 8/31 - 9/3 15. Steroid induced hyperglycemia  Continue to monitor   LOS (Days) 2 A FACE TO FACE EVALUATION WAS PERFORMED  Caren Garske Lorie Phenix, MD 02/28/2018 7:59 AM

## 2018-02-28 NOTE — Progress Notes (Signed)
Social Work  Social Work Assessment and Plan  Patient Details  Name: Wayne Booth MRN: [id number] Date of Birth: 1940/10/15  Today's Date: 02/28/2018  Problem List:  Patient Active Problem List   Diagnosis Date Noted  . Steroid-induced hyperglycemia   . Leukocytosis   . Chronic kidney disease (CKD), stage III (moderate) (HCC)   . Debility 02/26/2018  . Pneumonia due to methicillin resistant Staphylococcus aureus (MRSA) ([place])   . Dyspnea   . Pneumonia of right middle lobe due to infectious organism ([place])   . Acute on chronic systolic and diastolic heart failure, NYHA class 2 ([place])   . HCAP (healthcare-associated pneumonia) 02/03/2018  . Pericardial effusion   . SOB (shortness of breath)   . Elevated troponin   . Respiratory failure ([place]) 02/02/2018  . Paroxysmal atrial fibrillation ([place]) 01/28/2018  . Constipation 01/21/2018  . (HFpEF) heart failure with preserved ejection fraction ([place]) 12/16/2017  . Chest pain   . Anticoagulated   . Atrial flutter ([place]) 11/01/2017  . Bilateral foot pain 07/13/2017  . Community acquired pneumonia 06/18/2017  . Shortness of breath 06/14/2017  . History of adenomatous polyp of colon 04/12/2017  . History of pulmonary embolism 10/23/2016  . Cervical disc disease 11/13/2015  . Essential tremor 08/14/2015  . Gout 08/14/2015  . Hypothyroidism 07/12/2014  . Hyperglycemia 07/12/2014  . GAD (generalized anxiety disorder) 11/07/2013  . OSA (obstructive sleep apnea) 07/07/2011  . Asthmatic bronchitis 06/09/2011  . Actinic keratosis 11/27/2009  . Osteoarthritis 11/13/2008  . 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 Morbid obesity ([place]) 10/02/2008  . History of renal cell carcinoma 12/26/2007  . TESTOSTERONE DEFICIENCY 12/28/2006  . Essential hypertension 12/28/2006   Past Medical History:  Past Medical History:  Diagnosis Date  . Allergy    States his nose runs when he is around grease.   Marland Kitchen Anxiety   . Arthritis   . Atrial fibrillation  ([place])   . Cancer ([place])   . Coronary artery disease   . History of total knee replacement   . Hypertension   . Hypothyroidism   . Kidney cancer, primary, with metastasis from kidney to other site [facility])   . Kidney stone   . OSA (obstructive sleep apnea)    pt does not wear cpap at night  . Pituitary cyst ([place])   . Pneumonia    hx of  . Prostate cancer [facility])    Past Surgical History:  Past Surgical History:  Procedure Laterality Date  . ABLATION OF DYSRHYTHMIC FOCUS  01/28/2018  . ANTERIOR CERVICAL DECOMP/DISCECTOMY FUSION N/A 08/29/2012   Procedure: ANTERIOR CERVICAL DECOMPRESSION/DISCECTOMY FUSION 1 LEVEL;  Surgeon: Eustace Moore, MD;  Location: [place] NEURO ORS;  Service: Neurosurgery;  Laterality: N/A;  . APPENDECTOMY    . ATRIAL FIBRILLATION ABLATION N/A 01/28/2018   Procedure: ATRIAL FIBRILLATION ABLATION;  Surgeon: Constance Haw, MD;  Location: [place] CV LAB;  Service: Cardiovascular;  Laterality: N/A;  . CARDIAC CATHETERIZATION  2008   clean  . CHOLECYSTECTOMY    . COLONOSCOPY W/ POLYPECTOMY    . CRANIOTOMY N/A 11/08/2012   Procedure: CRANIOTOMY HYPOPHYSECTOMY TRANSNASAL APPROACH;  Surgeon: Faythe Ghee, MD;  Location: [place] NEURO ORS;  Service: Neurosurgery;  Laterality: N/A;  Transphenoidal Resection of Pituitary Tumor  . FINGER SURGERY Left 2017   Dr Amedeo Plenty  . INSERTION PROSTATE RADIATION SEED    . KIDNEY STONE SURGERY    . KNEE ARTHROSCOPY  2007   left  . NEPHRECTOMY Left   .  POSTERIOR CERVICAL LAMINECTOMY Left 04/24/2015   Procedure: Foraminotomy cervical five - cervical six cervical six - cervical seven left;  Surgeon: Eustace Moore, MD;  Location: [facility]UR[facility]S;  Service: Neurosurgery;  Laterality: Left;  . REPLACEMENT TOTAL KNEE BILATERAL     Social History:  reports that he has quit smoking. His smoking use included cigars. He quit after 4.00 years of use. He has never used smokeless tobacco. He reports that he drinks about 2.0 standard drinks of alcohol per  week. He reports that he does not use drugs.  Family / Support Systems Marital Status: Married Patient Roles: Spouse, Parent Spouse/Significant Other: [name] [phone]  [phone] Children: Darryl-son [phone]-cell   Other Supports: Ronnie-son [phone] Anticipated Caregiver: [name] Ability/Limitations of Caregiver: no limitations and can provide assist Caregiver Availability: 24/7 Family Dynamics: Close knit family who are there for one another. Pt is stubborn and plans to be independent before going home. He is moving well and motivated to get home. They have friends and church members that are supportive and involved.  Social History Preferred language: English Religion: Methodist Cultural Background: No issues Education: Secretary/administrator educated Read: Yes Write: Yes Employment Status: Retired Insurance account manager: None-according to MD pt is capable of making his own decisions while here. His wife will be here daily to see his progress.   Abuse/Neglect Abuse/Neglect Assessment Can Be Completed: Yes Physical Abuse: Denies Verbal Abuse: Denies Sexual Abuse: Denies Exploitation of patient/patient's resources: Denies Self-Neglect: Denies  Emotional Status Pt's affect, behavior adn adjustment status: Pt is motivated to do well and get back home without an assistive device. He has always manage to take care of himself and plans to once again. He has recovered from both of his TKR's. He is glad to be feeling better and ready to get moving. Recent Psychosocial Issues: other health issues he has managed to remain independent Pyschiatric History: History of anxiety takes medications for this and finds it helpful and controls his anxiety. Will see if would benefit from seeing neuro-psych while here. Will be short length of stay due to high level Substance Abuse History: No issues  Patient / Family Perceptions, Expectations & Goals Pt/Family understanding of illness & functional  limitations: Pt and wife can explain his pneumonia and medical treatment he has had while here. Both have spoken with the MD's and feel they have a good understanidng of his treatment plan going forward.  Premorbid pt/family roles/activities: husband, father, grandfather, retiree, home owner, church member,etc Anticipated changes in roles/activities/participation: resume Pt/family expectations/goals: Pt states: " I plan to be walking without a cane or a walker by the time I leave here."  Wife states: " I will assist but know how hard headed he is and wants to do on his own."  [employer]: Other (Comment)(has HH after TKR's) Premorbid Home Care/DME Agencies: Other (Comment)(has cane and RW from previous surgeries) Transportation available at discharge: Wife  Discharge Planning Living Arrangements: Spouse/significant other Support Systems: Spouse/significant other, Children, Water engineer, Social worker community Type of Residence: Private residence Insurance Resources: Multimedia programmer (specify)(UHC-Medicare) Financial Resources: Radio broadcast assistant Screen Referred: No Living Expenses: Own Money Management: Patient, Spouse Does the patient have any problems obtaining your medications?: No Home Management: Both wife does the inside work and he would do the outside work Patient/Family Preliminary Plans: Return home with wife who is able to assist if needed. He plans to be mod/i and not need assistive device when he leaves here. Aware team conference Wed and probably 7-8 days  LOS. He would like to go home by the end of the week. Social Work Anticipated Follow Up Needs: HH/OP  Clinical Impression Pleasant gentleman who is motivated and already doing well, needed to be reminded not to get up on his own in his room. Progressing well and will be a short length of stay. Supportive and involved family, wife able to assist if needed. Will work on discharge needs and  see if would benefit from seeing neuro-psych while here.  Elease Hashimoto 02/28/2018, 9:59 AM

## 2018-02-28 NOTE — Care Management Note (Signed)
Inpatient Rehabilitation Center Individual Statement of Services  Patient Name:  Wayne Booth  Date:  02/28/2018  Welcome to the [place].  Our goal is to provide you with an individualized program based on your diagnosis and situation, designed to meet your specific needs.  With this comprehensive rehabilitation program, you will be expected to participate in at least 3 hours of rehabilitation therapies Monday-Friday, with modified therapy programming on the weekends.  Your rehabilitation program will include the following services:  Physical Therapy (PT), Occupational Therapy (OT), 24 hour per day rehabilitation nursing, Therapeutic Recreaction (TR), Case Management (Social Worker), Rehabilitation Medicine, Nutrition Services and Pharmacy Services  Weekly team conferences will be held on Wednesday to discuss your progress.  Your Social Worker will talk with you frequently to get your input and to update you on team discussions.  Team conferences with you and your family in attendance may also be held.  Expected length of stay: 7-9 days  Overall anticipated outcome: Independent with device  Depending on your progress and recovery, your program may change. Your Social Worker will coordinate services and will keep you informed of any changes. Your Social Worker's name and contact numbers are listed  below.  The following services may also be recommended but are not provided by the [place] will be made to provide these services after discharge if needed.  Arrangements include referral to agencies that provide these services.  Your insurance has been verified to be:  UHC-Medicare Your primary doctor is:  [name]  Pertinent information will be shared with your doctor and your insurance company.  Social Worker:  Ovidio Kin,  [place] or ([phone]  Information discussed with and copy given to patient by: Elease Hashimoto, 02/28/2018, 9:19 AM

## 2018-02-28 NOTE — IPOC Note (Addendum)
Overall Plan of Care [facility]) Patient Details Name: OMARIUS GRANTHAM MRN: [id number] DOB: 07-26-1940  Admitting Diagnosis: <principal problem not specified>  Hospital Problems: Active Problems:   Debility   Steroid-induced hyperglycemia   Leukocytosis   Chronic kidney disease (CKD), stage III (moderate) (HCC)     Functional Problem List: Nursing Bladder, Endurance, Skin Integrity  PT Balance, Endurance, Behavior, Motor, Pain  OT Balance, Cognition, Endurance, Pain, Safety  SLP    TR         Basic ADL's: OT Grooming, Bathing, Dressing, Toileting     Advanced  ADL's: OT Simple Meal Preparation     Transfers: PT Bed Mobility, Bed to Chair, Car, Furniture, Floor  OT Toilet, Metallurgist: PT Ambulation, Emergency planning/management officer, Stairs     Additional Impairments: OT    SLP        TR      Anticipated Outcomes Item Anticipated Outcome  Self Feeding no goal  Swallowing      Basic self-care  MOD I   Toileting  MOD I    Bathroom Transfers MOD I toilet, S shower  Bowel/Bladder  to be continent of B&B LBM 02/26/18  Transfers  Mod I  Locomotion  supervision  Communication     Cognition     Pain  less than 2  Safety/Judgment  to remain fall free while in rehab   Therapy Plan: PT Intensity: Minimum of 1-2 x/day ,45 to 90 minutes PT Frequency: 5 out of 7 days PT Duration Estimated Length of Stay: 7-9 days OT Intensity: Minimum of 1-2 x/day, 45 to 90 minutes OT Frequency: 5 out of 7 days OT Duration/Estimated Length of Stay: 7-9      Team Interventions: Nursing Interventions Patient/Family Education, Disease Management/Prevention, Discharge Planning, Dysphagia/Aspiration Precaution Training  PT interventions Ambulation/gait training, Disease management/prevention, Pain management, Stair training, Training and development officer, DME/adaptive equipment instruction, Patient/family education, Therapeutic Activities, Wheelchair propulsion/positioning,  Therapeutic Exercise, Functional mobility training, UE/LE Strength taining/ROM, Discharge planning, UE/LE Coordination activities, Neuromuscular re-education  OT Interventions Balance/vestibular training, Discharge planning, Self Care/advanced ADL retraining, Pain management, Therapeutic Activities, UE/LE Coordination activities, Cognitive remediation/compensation, Disease mangement/prevention, Functional mobility training, Patient/family education, Skin care/wound managment, Therapeutic Exercise, Community reintegration, DME/adaptive equipment instruction, Neuromuscular re-education, Psychosocial support, Splinting/orthotics, UE/LE Strength taining/ROM  SLP Interventions    TR Interventions    SW/CM Interventions  Psychosocial Assessment, Discharge Panning and Pt/Family Education   Barriers to Discharge MD  Medical stability and Weight  Nursing      PT      OT      SLP      SW       Team Discharge Planning: Destination: PT-Home ,OT- Home , SLP-  Projected Follow-up: PT-Home health PT, OT-  Home health OT, SLP-  Projected Equipment Needs: PT-To be determined, OT- Tub/shower seat, SLP-  Equipment Details: PT- , OT-  Patient/family involved in discharge planning: PT- Patient,  OT-Patient, Family member/caregiver, SLP-   MD ELOS: 6-8 days. Medical Rehab Prognosis:  Excellent Assessment: 77 year old right-handed male with history of atrial fibrillation status post ablation maintained on Eliquis, sleep apnea, COPD, CKD stage III, history of pulmonary emboli, hypertension. Presented 02/03/2018 with increasing shortness of breath and decreased mobility over a 5-day span as well as associated right-sided pleuritic chest pain. Chest x-ray showed right lung mid lung zone consolidation concerning for pneumonia. Perfusion scan low probability for pulmonary emboli. CT of the chest showed bilateral multilobar pneumonia involving the right upper, middle and  both lower lobes. Mild hyponatremia 131,  troponin 0.12, lactic acid 1.51. Placed on broad-spectrum antibiotics for suspect sepsis. Echocardiogram with ejection fraction of 55% no wall motion abnormalities. Patient did require intubation for acute respiratory failure 02/12/2018 until 02/17/2018. Persistent leukocytosis consulted antibiotic course completed advised to continue to monitor. Recent blood cultures negative. Patient with resultant functional deficits with endurance, mobility, transfers, self-care. Will set goals are mod I with PT/OT.    See Team Conference Notes for weekly updates to the plan of care

## 2018-02-28 NOTE — Progress Notes (Signed)
Occupational Therapy Session Note  Patient Details  Name: Wayne Booth MRN: [id number] Date of Birth: 07/13/1940  Today's Date: 02/28/2018 OT Individual Time: 0945-1100  And 1345- 1420  OT Individual Time Calculation (min): 75 min and 35 min (missed 25 min for fatigue)  Short Term Goals: Week 1:  OT Short Term Goal 1 (Week 1): STG=LTG d/t ELOS  Skilled Therapeutic Interventions/Progress Updates:    Visit 1: Pain: no c/o pain Pt received in recliner in room.  Pt declined a bath this am but desires to shave this afternoon after his wife brings in his razor.  Pt stated he donned a clean shirt this am. Pt ambulated from his room to therapy gym with RW with contact guard A. O2 100% on room air.  Pt discussed how his IT bands on B hips felt tight and stiff.  Pt guided into supine on mat.  With B knees bent, knee sways side to side.  Demonstrated with return demonstration with ITB stretches using gait belt for stretching leg across body. Pt worked on rolling directly onto side with guiding A and then pushed into sit with mod A.  Pt worked on UE strengthening and activity tolerance using a 2# dowel bar for 4 sets of 10 chest press, 10 shoulder press, 10 forward and 10 back rows.  Pt paced himself based on what he felt he could tolerate.  He then worked on tricep dips with push up blocks completing 2 at a time for 4 sets, with 1 min rest breaks. Pt began ambulating back to room and needed to stop halfway from fatigue. His NT obtained his w/c.  O2 sats 95%.  Pt transported to his room and then transferred to the recliner.  All needs met.  Visit 2: Pain: no c/o pain Pt received in recliner anxious to shave. He ambulated with RW to sink and stood for about 1 min to shave and then fatigued quickly. He alternated between standing and sitting to shave.  He completed task and then worked on chair pushups for 3 sets at a time and then sit to stand 3 reps at a time.  Pt stated he was fatigue so rested a few  minutes.  After a few minutes of rest he still felt too tired to continue so requested to stop therapy early.  He stepped back to recliner.  Discussed with pt and his NT that if he is still feeling tired this evening to go to the bathroom via w/c and not the walker.  Pt resting in recliner with all needs met.  Therapy Documentation Precautions:  Precautions Precautions: Fall Precaution Comments: watch SpO2 Restrictions Weight Bearing Restrictions: No    Vital Signs: Oxygen Therapy SpO2: 99 % O2 Device: Room Air   See Function Navigator for Current Functional Status.   Therapy/Group: Individual Therapy  [place] 02/28/2018, 9:36 AM

## 2018-02-28 NOTE — Progress Notes (Signed)
Patient has home BiPAP in room but prefers not to start it tonight. RT let patient know that if it was needed the RT  could come and set it up for him when it's wanted.

## 2018-03-01 ENCOUNTER — Inpatient Hospital Stay (HOSPITAL_COMMUNITY): Payer: Medicare Other | Admitting: Physical Therapy

## 2018-03-01 ENCOUNTER — Ambulatory Visit: Payer: Medicare Other | Admitting: Cardiology

## 2018-03-01 ENCOUNTER — Inpatient Hospital Stay (HOSPITAL_COMMUNITY): Payer: Medicare Other | Admitting: Occupational Therapy

## 2018-03-01 ENCOUNTER — Ambulatory Visit (HOSPITAL_COMMUNITY): Payer: Medicare Other | Admitting: Nurse Practitioner

## 2018-03-01 ENCOUNTER — Ambulatory Visit: Payer: Medicare Other | Admitting: Family Medicine

## 2018-03-01 DIAGNOSIS — R0989 Other specified symptoms and signs involving the circulatory and respiratory systems: Secondary | ICD-10-CM

## 2018-03-01 NOTE — Progress Notes (Signed)
Patient has home CPAP. Patient stated he didn't fill like wearing his CPAP tonight.

## 2018-03-01 NOTE — Progress Notes (Signed)
Physical Therapy Session Note  Patient Details  Name: Wayne Booth MRN: [id number] Date of Birth: 06-Aug-1940  Today's Date: 03/01/2018 PT Individual Time: 1400-1500 PT Individual Time Calculation (min): 60 min   Short Term Goals: Week 1:  PT Short Term Goal 1 (Week 1): STG=LTG due to ELOS  Skilled Therapeutic Interventions/Progress Updates:   Pt in w/c and agreeable to therapy, denies pain. Ambulated to/from therapy gym w/ supervision using RW, increased work of breathing requiring prolonged seated rest break to resolve. SpO2 97% after walk. Worked on standing w/o UE support this session while perform UE reaching tasks to challenge balance, emphasis on maintaining balance w/ trunk rotation, stepping, and forward reaching. Min assist to close supervision for balance w/ verbal cues for balance strategies. Able to tolerate standing for 1-2 min at a time before needing seated rest break to catch breath. Worked on gait w/o AD, ambulated 52' and 30' x2 w/ min guard and verbal cues for breathing strategies, prolonged seated rest break needed in between bouts. Returned to room w/ RW and ended session in chair, in care of wife and all needs met.   Therapy Documentation Precautions:  Precautions Precautions: Fall Precaution Comments: watch SpO2 Restrictions Weight Bearing Restrictions: No  See Function Navigator for Current Functional Status.   Therapy/Group: Individual Therapy  [name] K Arnette 03/01/2018, 3:03 PM

## 2018-03-01 NOTE — Progress Notes (Signed)
Physical Therapy Session Note  Patient Details  Name: Wayne Booth MRN: [id number] Date of Birth: Nov 01, 1940  Today's Date: 03/01/2018 PT Individual Time: 0800-0900 PT Individual Time Calculation (min): 60 min   Short Term Goals: Week 1:  PT Short Term Goal 1 (Week 1): STG=LTG due to ELOS  Skilled Therapeutic Interventions/Progress Updates: Pt received seated in recliner, denies pain and agreeable to treatment. Gait to gym with RW and S; vitals following trial 165/79 HR 96 O2 98% and requires prolonged rest break to recover due to shortness of breath, then BP normalized to 131/74 within 3-4 min. Standing balance with BLE gastroc stretch while performing dynamic reaching for card matching task at mirror board; card placed outside BOS to facilitate weight shifting and controlling COM over BOS; able to perform approx 2 min standing before requiring seated rest break due to shortness of breath. Standing heel raises/toe raises 2x15 each.  Sit to stand no UE support 2x5 reps; pt becomes SOB quickly and requires rest breaks. Standing balance on airex foam pad no UE support 2x1 min; minA initially, faded to close S with repetition and improved coordination of ankle strategy, second trial performed with dynamic UE reaching to increase internally generated perturbations and anticipatory postural adjustments. Forward/backward/sidesteps at mat table with min guard; small shuffling steps lacking heel/toe gait pattern. Gait x30' min guard no AD x2 trials; pt again very fatigued, SOB, requiring seated rest break to recover. Returned to room totalA in w/c for energy conservation. MinA transfer on/off scale with cues for technique and hand placement. Remained seated in w/c, all needs in reach at completion of session.      Therapy Documentation Precautions:  Precautions Precautions: Fall Precaution Comments: watch SpO2 Restrictions Weight Bearing Restrictions: No  See Function Navigator for Current  Functional Status.   Therapy/Group: Individual Therapy  [name] 03/01/2018, 8:59 AM

## 2018-03-01 NOTE — Progress Notes (Signed)
Occupational Therapy Session Note  Patient Details  Name: Wayne Booth MRN: [id number] Date of Birth: [birth year]  Today's Date: 03/01/2018 OT Individual Time: 7579-7282 OT Individual Time Calculation (min): 75 min    Short Term Goals: Week 1:  OT Short Term Goal 1 (Week 1): STG=LTG d/t ELOS  Skilled Therapeutic Interventions/Progress Updates:    Pt seen this session to focus on activity tolerance and balance.  Pt requested to shave first which he requested to do from sitting vs standing.  He then ambulated to toilet to toilet and then to shower.  Pt completed all transfers using RW with S.  He was able to bathe and dress himself taking frequent rest breaks with S. He needed a longer rest break of 5 min after his shower.  He uses a sock aid at home from his B knee replacements but did not have one available, so just assisted pt.    Pt transported to day room to work on arm exercise bike with no resistance for his activity tolerance. Pt worked for approximately 2-3 min, rested 1 min for a total of 20 min.  Pt taken back to room and he opted to stay in his w/c.  Pt in room with all needs met.    Therapy Documentation Precautions:  Precautions Precautions: Fall Precaution Comments: watch SpO2 Restrictions Weight Bearing Restrictions: No    Vital Signs: Oxygen Therapy SpO2: 98 % O2 Device: Room Air Pain: Pain Assessment Pain Score: 0-No pain ADL:  See Function Navigator for Current Functional Status.   Therapy/Group: Individual Therapy  [place] 03/01/2018, 9:59 AM

## 2018-03-01 NOTE — Progress Notes (Signed)
[facility]IC[facility] & [facility]ILIT[facility]     PROGRESS NOTE    Subjective/Complaints: Patient seen sitting up in his chair this morning. He states he slept well overnight. He has questions about seeing infectious diseases and cardiology.  ROS: Denies CP, SOB, N/V/D   Objective:  No results found. Recent Labs    02/28/18 0733  WBC 19.2*  HGB 14.6  HCT 46.3  PLT 342   Recent Labs    02/28/18 0529  NA 136  K 4.5  CL 101  GLUCOSE 138*  BUN 22  CREATININE 1.46*  CALCIUM 9.1   CBG (last 3)  No results for input(s): GLUCAP in the last 72 hours.  Wt Readings from Last 3 Encounters:  02/27/18 117.1 kg  02/25/18 118.6 kg  01/31/18 (!) 136.4 kg     Intake/Output Summary (Last 24 hours) at 03/01/2018 0750 Last data filed at 02/28/2018 1750 Gross per 24 hour  Intake 684 ml  Output -  Net 684 ml    Vital Signs: Blood pressure 131/81, pulse 83, temperature 98 F (36.7 C), temperature source Oral, resp. rate 18, height 5\' 10"  (1.778 m), weight 117.1 kg, SpO2 98 %. Physical Exam:  Constitutional: No distress . Vital signs reviewed. HENT: Normocephalic.  Atraumatic. Eyes: EOMI. No discharge. Cardiovascular: irregularly irregular. No JVD. Respiratory: CTA bilaterally. Normal effort. GI: BS +. Non-distended. Musculoskeletal: He exhibits LE edema  Neurological: He is alert and oriented Motor: B/l UE 4/5  B/l LE: 4/5 HF, KE and 4/5 ADF/PF.  Skin: Skin is warm and dry.  Psychiatric: normal mood. Normal behavior.  Assessment/Plan: 1. Functional deficits secondary to debility which require 3+ hours per day of interdisciplinary therapy in a comprehensive inpatient rehab setting. Physiatrist is providing close team supervision and 24 hour management of active medical problems listed below. Physiatrist and rehab team continue to assess barriers to discharge/monitor patient progress toward functional and medical goals.  Function:  Bathing Bathing position      Bathing  parts Body parts bathed by patient: Right arm, Left arm, Chest, Abdomen, Front perineal area, Buttocks, Right upper leg, Left upper leg Body parts bathed by helper: Right lower leg, Left lower leg, Back  Bathing assist Assist Level: Touching or steadying assistance(Pt > 75%)      Upper Body Dressing/Undressing Upper body dressing   What is the patient wearing?: Pull over shirt/dress     Pull over shirt/dress - Perfomed by patient: Thread/unthread right sleeve, Put head through opening, Thread/unthread left sleeve Pull over shirt/dress - Perfomed by helper: Pull shirt over trunk        Upper body assist        Lower Body Dressing/Undressing Lower body dressing   What is the patient wearing?: Pants, Non-skid slipper socks     Pants- Performed by patient: Thread/unthread right pants leg, Pull pants up/down Pants- Performed by helper: Thread/unthread left pants leg   Non-skid slipper socks- Performed by helper: Don/doff right sock, Don/doff left sock                  Lower body assist Assist for lower body dressing: Touching or steadying assistance (Pt > 75%)      Toileting Toileting   Toileting steps completed by patient: Adjust clothing prior to toileting, Performs perineal hygiene, Adjust clothing after toileting Toileting steps completed by helper: Adjust clothing prior to toileting, Performs perineal hygiene, Adjust clothing after toileting Toileting Assistive Devices: Grab bar or rail  Toileting assist Assist level: Touching or  steadying assistance (Pt.75%)   Transfers Chair/bed transfer   Chair/bed transfer method: Stand pivot Chair/bed transfer assist level: Moderate assist (Pt 50 - 74%/lift or lower) Chair/bed transfer assistive device: Armrests, Medical sales representative       Assist level: Touching or steadying assistance (Pt > 75%)   Wheelchair          Cognition Comprehension Comprehension assist level: Follows basic  conversation/direction with no assist  Expression Expression assist level: Expresses basic needs/ideas: With no assist  Social Interaction Social Interaction assist level: Interacts appropriately with others with medication or extra time (anti-anxiety, antidepressant).  Problem Solving Problem solving assist level: Solves basic 90% of the time/requires cueing < 10% of the time  Memory Memory assist level: Recognizes or recalls 90% of the time/requires cueing < 10% of the time   Medical Problem List and Plan:  1. Debility secondary to pneumonia/respiratory failure with resolving sepsis.   Cont CIR 2. DVT Prophylaxis/Anticoagulation: Chronic Eliquis  3. Pain Management: Tylenol as needed, rx gout as below  4. Mood: Lexapro 20 mg daily, Pamelor 75 mg nightly  5. Neuropsych: This patient is ?fully capable of making decisions on his own behalf.  6. Skin/Wound Care: Routine skin checks  7. Fluids/Electrolytes/Nutrition: Routine in and outs  8. Atrial fibrillation status post ablation. Continue Eliquis. Cardiac rate controlled  9. CKD stage III. Monitor labs, I's and O's   AKI  Resolving, creatinine 1.46 on 9/2  Continue to monitor 10. MRSA PCR screening positive. Contact precautions  11. Leukocytosis. Antibiotic therapy completed.Recent blood cultures negative. Follow-up infectious disease as needed   WBC is 19.2 on 9/2  Afebrile  Confounded by steroids  Cont to monitor  12. Hypothyroidism. Synthroid  13. Obesity. BMI 38.75. Dietary follow-up  14. Hx of gout, ?flare in forefeet.  Prednisone 8/31 - 9/3 15. Steroid induced hyperglycemia  Continue to monitor 16. Labile blood pressure  Labile on 9/3  LOS (Days) 3 A FACE TO FACE EVALUATION WAS PERFORMED  Elois Averitt Lorie Phenix, MD 03/01/2018 7:50 AM

## 2018-03-01 NOTE — Progress Notes (Signed)
Patient information reviewed and entered into eRehab system by Yanira Tolsma, RN, CRRN, PPS Coordinator.  Information including medical coding and functional independence measure will be reviewed and updated through discharge.     Per nursing patient was given "Data Collection Information Summary for Patients in Inpatient Rehabilitation Facilities with attached "Privacy Act Statement-Health Care Records" upon admission.  

## 2018-03-02 ENCOUNTER — Inpatient Hospital Stay (HOSPITAL_COMMUNITY): Payer: Medicare Other | Admitting: Physical Therapy

## 2018-03-02 ENCOUNTER — Inpatient Hospital Stay (HOSPITAL_COMMUNITY): Payer: Medicare Other

## 2018-03-02 ENCOUNTER — Inpatient Hospital Stay (HOSPITAL_COMMUNITY): Payer: Medicare Other | Admitting: Occupational Therapy

## 2018-03-02 MED ORDER — TRAMADOL HCL 50 MG PO TABS
50.0000 mg | ORAL_TABLET | Freq: Once | ORAL | Status: AC
  Administered 2018-03-02: 50 mg via ORAL
  Filled 2018-03-02: qty 1

## 2018-03-02 MED ORDER — POLYETHYLENE GLYCOL 3350 17 G PO PACK
17.0000 g | PACK | Freq: Every day | ORAL | Status: DC
  Administered 2018-03-02 – 2018-03-05 (×4): 17 g via ORAL
  Filled 2018-03-02 (×4): qty 1

## 2018-03-02 MED ORDER — CYCLOBENZAPRINE HCL 10 MG PO TABS
10.0000 mg | ORAL_TABLET | Freq: Three times a day (TID) | ORAL | Status: DC | PRN
  Administered 2018-03-03 – 2018-03-04 (×2): 10 mg via ORAL
  Filled 2018-03-02 (×3): qty 1

## 2018-03-02 MED ORDER — COLCHICINE 0.6 MG PO TABS
0.6000 mg | ORAL_TABLET | Freq: Every day | ORAL | Status: DC
  Administered 2018-03-02: 0.6 mg via ORAL
  Filled 2018-03-02: qty 1

## 2018-03-02 MED ORDER — TRAMADOL HCL 50 MG PO TABS
50.0000 mg | ORAL_TABLET | Freq: Four times a day (QID) | ORAL | Status: DC | PRN
  Administered 2018-03-02 – 2018-03-05 (×8): 50 mg via ORAL
  Filled 2018-03-02 (×9): qty 1

## 2018-03-02 NOTE — Progress Notes (Signed)
Pt continues to complain of pain to top of left fore foot.  Foot has +2/3 pitting, red, with rash noted.  Ted hose removed, feet elevated and heat pack applied to site. Called D. Angiulli PA-C, new orders received. Will continue to monitor.

## 2018-03-02 NOTE — Progress Notes (Addendum)
Pt c/o of increased pain to bil lower feet/ says feels like gout pain. Also says that voice is getting more hoarse each day.  Will notify PA and continue to monitor.  New orders received.

## 2018-03-02 NOTE — Progress Notes (Signed)
Occupational Therapy Session Note  Patient Details  Name: Wayne Booth MRN: [id number] Date of Birth: 1941-05-28  Today's Date: 03/02/2018 OT Individual Time: 1100-1155 OT Individual Time Calculation (min): 55 min    Short Term Goals: Week 1:  OT Short Term Goal 1 (Week 1): STG=LTG d/t ELOS  Skilled Therapeutic Interventions/Progress Updates:    Pt seen for OT ADL bathing/ dressing session. Pt sitting up in recliner upon arrival, voicing need to go to bathroom, declining need for staff supervision assist. Education provided regarding need for supervision with all mobility at this time- voiced understanding. He ambulated throughout room with RW and supervision, VCs for proper RW management during functional ambulation and transfers. Pt unable to void. He transitioned to shower and bathed seated on tub transfer bench with supervision using LH sponge to assist with LB. HE returned to sitting on BSC to dress, completed with supervision. Noted increased edema in B LEs. RN made aware and gave verbal orders to don knee high TEDs. Education provided to pt regarding purpose and wear schedule of TEDs. Pt able to then don B shoes with set-up.  Pt completed shaving task at sink. Requested to sit, however, reports standing to do task at home. Therefore encouraged pt to attempt in standing today. Pt unable to tolerate standing long enough to complete full task, therefore seated rest breaks provided intermittently throughout task. Provided education and examples of energy conservation techniques as well as importance of incorporating standing into ADL tasks in order to cont to increase functional activity tolerance.  Pt returned to recliner at end of session, left set-up with meal tray, LEs elevated for edema management, all needs in reach and chair alarm on.   Therapy Documentation Precautions:  Precautions Precautions: Fall Precaution Comments: watch SpO2 Restrictions Weight Bearing Restrictions:  No Pain:   No/denies pain  See Function Navigator for Current Functional Status.   Therapy/Group: Individual Therapy  Lianah Peed L 03/02/2018, 8:59 AM

## 2018-03-02 NOTE — Plan of Care (Signed)
  Problem: Consults Goal: RH GENERAL PATIENT EDUCATION Description See Patient Education module for education specifics. Outcome: Progressing   Problem: RH BOWEL ELIMINATION Goal: RH STG MANAGE BOWEL WITH ASSISTANCE Description STG Manage Bowel with Min Assistance.  Outcome: Not Progressing Goal: RH STG MANAGE BOWEL W/MEDICATION W/ASSISTANCE Description STG Manage Bowel with Medication with [place].  Outcome: Not Progressing   Problem: RH BLADDER ELIMINATION Goal: RH STG MANAGE BLADDER WITH ASSISTANCE Description STG Manage Bladder With Min  Assistance  Outcome: Progressing   Problem: RH SKIN INTEGRITY Goal: RH STG SKIN FREE OF INFECTION/BREAKDOWN Description Skin will be free of infection/breakdown with min assistance  Outcome: Progressing Goal: RH STG MAINTAIN SKIN INTEGRITY WITH ASSISTANCE Description STG Maintain Skin Integrity With min Assistance.  Outcome: Progressing Goal: RH STG ABLE TO PERFORM INCISION/WOUND CARE W/ASSISTANCE Description STG Able To Perform Incision/Wound Care With min Assistance.  Outcome: Progressing   Problem: RH SAFETY Goal: RH STG ADHERE TO SAFETY PRECAUTIONS W/ASSISTANCE/DEVICE Description STG Adhere to Safety Precautions With min  Assistance/Device.  Outcome: Progressing   Problem: RH PAIN MANAGEMENT Goal: RH STG PAIN MANAGED AT OR BELOW PT'S PAIN GOAL Description Pt will verbalize pain using pain scale of 0 to 10 with min assist and report level below 2 out of 10 with prn medication use  Outcome: Progressing   Problem: RH KNOWLEDGE DEFICIT GENERAL Goal: RH STG INCREASE KNOWLEDGE OF SELF CARE AFTER HOSPITALIZATION Outcome: Progressing   Problem: Activity: Goal: Ability to tolerate increased activity will improve Description Pt will improve tolerance to activity with min assit  Outcome: Progressing Goal: Will verbalize the importance of balancing activity with adequate rest periods Description Pt will verbalize the  importance of balancing activity with rest periods with min assistance  Outcome: Progressing   Problem: Respiratory: Goal: Ability to maintain a clear airway will improve Description Pt will maintain a clear airway by positing self for meals and using IS and flutter valve with min assist  Outcome: Progressing Goal: Levels of oxygenation will improve Description Pt will decrease SOB with activity and maintain O2 stat greater than 95% on RA  Outcome: Progressing Goal: Ability to maintain adequate ventilation will improve Description O2 stat will remain greater than 95% on RA  Outcome: Progressing  Pt c/o constipation, will ask in AM for prn laxative as pt only has scheduled meds, no bm x2 days and pt states he usually has daily bm.  Instruct pt to increase fluids, drink prune juice for now.  Instruct pt in use of incentive spirometer.

## 2018-03-02 NOTE — Patient Care Conference (Signed)
Inpatient RehabilitationTeam Conference and Plan of Care Update Date: 03/02/2018   Time: 2:00 PM    Patient Name: Wayne Booth      Medical Record Number: [id number]  Date of Birth: [birth year] Sex: Male         Room/Bed: 4M02C/4M02C-01 Payor Info: Payor: Marine scientist / Plan: [employer] MEDICARE / Product Type: *No Product type* /    Admitting Diagnosis: Debility  Admit Date/Time:  02/26/2018  1:27 PM Admission Comments: No comment available   Primary Diagnosis:  <principal problem not specified> Principal Problem: <principal problem not specified>  Patient Active Problem List   Diagnosis Date Noted  . Labile blood pressure   . Steroid-induced hyperglycemia   . Leukocytosis   . Chronic kidney disease (CKD), stage III (moderate) (HCC)   . Debility 02/26/2018  . Pneumonia due to methicillin resistant Staphylococcus aureus (MRSA) ([place])   . Dyspnea   . Pneumonia of right middle lobe due to infectious organism ([place])   . Acute on chronic systolic and diastolic heart failure, NYHA class 2 ([place])   . HCAP (healthcare-associated pneumonia) 02/03/2018  . Pericardial effusion   . SOB (shortness of breath)   . Elevated troponin   . Respiratory failure ([place]) 02/02/2018  . Paroxysmal atrial fibrillation ([place]) 01/28/2018  . Constipation 01/21/2018  . (HFpEF) heart failure with preserved ejection fraction ([place]) 12/16/2017  . Chest pain   . Anticoagulated   . Atrial flutter ([place]) 11/01/2017  . Bilateral foot pain 07/13/2017  . Community acquired pneumonia 06/18/2017  . Shortness of breath 06/14/2017  . History of adenomatous polyp of colon 04/12/2017  . History of pulmonary embolism 10/23/2016  . Cervical disc disease 11/13/2015  . Essential tremor 08/14/2015  . Gout 08/14/2015  . Hypothyroidism 07/12/2014  . Hyperglycemia 07/12/2014  . GAD (generalized anxiety disorder) 11/07/2013  . OSA (obstructive sleep apnea) 07/07/2011  . Asthmatic bronchitis 06/09/2011  . Actinic  keratosis 11/27/2009  . Osteoarthritis 11/13/2008  . 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 Morbid obesity ([place]) 10/02/2008  . History of renal cell carcinoma 12/26/2007  . TESTOSTERONE DEFICIENCY 12/28/2006  . Essential hypertension 12/28/2006    Expected Discharge Date: Expected Discharge Date: 03/05/18  Team Members Present: Physician leading conference: Dr. Delice Lesch Social Worker Present: Ovidio Kin, LCSW Nurse Present: Dorien Chihuahua, RN PT Present: Kem Parkinson, PT OT Present: Napoleon Form, OT PPS Coordinator present : Daiva Nakayama, RN, CRRN     Current Status/Progress Goal Weekly Team Focus  Medical   Debility secondary to pneumonia/respiratory failure  Improve mobility, safety, BP, WBCs, renal function  See above   Bowel/Bladder   continent of b/b with min A, pt c/o constipation on 9/3 and had hard small bm on 9/4, pt requests prn laxative.    remain continent with normal b/b pattern with min A  monitor b/b q shift and prn   Swallow/Nutrition/ Hydration             ADL's   needs A with socks, but pt has a sock aide he will be bringing in from home.  Otherwise,  pt is S with all self care BUT needs frequent rest breaks due to deconditioning  mod I with basic self care, except for S with shower stall transfers and simple meal prep  ADL training, activity tolerance, balance training, pt/family education   Mobility   S with RW  modI transfers, S gait and stairs  aerobic endurance, dynamic standing balance, gait training   Communication  Safety/Cognition/ Behavioral Observations            Pain   pain is not an issue  pain <2 with min A  monitor pain q shift and prn   Skin   no skin issues, maintain skin integrity with min A  min A  monitor skin q shift and prn      *See Care Plan and progress notes for long and short-term goals.     Barriers to Discharge  Current Status/Progress Possible Resolutions Date Resolved   Physician    Medical  stability     See above  Therapies follow BP, encourage fluids      Nursing                  PT                    OT                  SLP                SW                Discharge Planning/Teaching Needs:  Home with wife who can assist, but pt doesn't want her too. Wants to go home by the end of week.      Team Discussion:  Goals mod/i level and progressing well toward these goals. MD monitoring his labs and now having gout flare so on steroids. Monitoring BP issues, was not on meds at home prior to admission. Has refused CPAP-wife brought in form home and says will use tonight. Cues for safety awareness and needs rest breaks.   Revisions to Treatment Plan:  DC 9/7    Continued Need for Acute Rehabilitation Level of Care: The patient requires daily medical management by a physician with specialized training in physical medicine and rehabilitation for the following conditions: Daily direction of a multidisciplinary physical rehabilitation program to ensure safe treatment while eliciting the highest outcome that is of practical value to the patient.: Yes Daily medical management of patient stability for increased activity during participation in an intensive rehabilitation regime.: Yes Daily analysis of laboratory values and/or radiology reports with any subsequent need for medication adjustment of medical intervention for : Renal problems;Blood pressure problems   I attest that I was present, lead the team conference, and concur with the assessment and plan of the team. Dr. Delice Lesch  Elease Hashimoto 03/02/2018, 2:53 PM

## 2018-03-02 NOTE — Progress Notes (Signed)
Occupational Therapy Session Note  Patient Details  Name: Wayne Booth MRN: [id number] Date of Birth: 1941-03-05  Today's Date: 03/02/2018 OT Individual Time: 1030-1055 OT Individual Time Calculation (min): 25 min    Short Term Goals: Week 1:  OT Short Term Goal 1 (Week 1): STG=LTG d/t ELOS  Skilled Therapeutic Interventions/Progress Updates:    Pt resting in w/c upon arrival and agreeable to therapy. Pt amb with RW to ADL apartment to sit on sofa.  Pt stated sofa is much lower than his chair at home.  Pt required min A for sit>stand from sofa.  Pt amb with RW to gym and engaged in game of horseshoes followed by retrieving with reacher and placing in container.  Pt required rest breaks between each segment.  Pt amb from gym to room and returned to recliner with all needs within reach and chair alarm activated. Focus on activity tolerance and functional amb with RW to increase endurance and independence with BADLs.  Therapy Documentation Precautions:  Precautions Precautions: Fall Precaution Comments: watch SpO2 Restrictions Weight Bearing Restrictions: No  Pain:  Pt denies pain  See Function Navigator for Current Functional Status.   Therapy/Group: Individual Therapy  Leroy Libman 03/02/2018, 11:01 AM

## 2018-03-02 NOTE — Progress Notes (Signed)
Social Work Patient ID: Wayne Booth, male   DOB: [birth year], 77 y.o.   MRN: [id number]  Met with pt and wife to discuss team conference goals of mod/i level and target discharge 9/7. They have medical questions and wanted to see MD have texted MD regarding couple waitng to see him with their questions. Wife to look for rolling walker at home and are agreeable to home health follow up. Wife has brought his CPAP to use tonight-pt agrees he will use it.

## 2018-03-02 NOTE — Progress Notes (Signed)
Physical Therapy Session Note  Patient Details  Name: Wayne Booth MRN: [id number] Date of Birth: Aug 17, 1940  Today's Date: 03/02/2018 PT Individual Time: 0900-1000 and 1300-1400 PT Individual Time Calculation (min): 60 min and 60 min (total 120 min)   Short Term Goals: Week 1:  PT Short Term Goal 1 (Week 1): STG=LTG due to ELOS  Skilled Therapeutic Interventions/Progress Updates: Tx 1: Pt received seated in recliner, denies pain but does report LE muscle soreness from yesterdays therapy sessions, otherwise agreeable to treatment. Requires rest breaks between each activity throughout session d/t fatigue, SOB. Gait to gym with RW and S x150'. Sit <>stand no UE support 3 sets 5 reps. Standing alternating toe taps to 5" step with no UE support and min guard 3 sets 20 taps, occasional mild LOBs requiring small corrective steps to recover. Side steps R/L over trekking pole with modA progressed to min guard with repetition; cues for technique to reduce narrow BOS and LOBs. Gait x50' no AD min guard. Ascent/descent 8 steps 6" height with B handrails and min guard, step-to pattern. Standing gastroc/soleus stretch 2x1 min each on steps. 2 sets 15 reps heel raises. Returned to room with RW and S; remained seated in recliner with alarm intact, all needs in reach at completion of session.   Tx 2: Pt received seated in recliner, denies pain and agreeable to treatment. Gait to gym with RW and S; cues for upright posture. Seated/standing LE strengthening exercises below and provided handout with instructions/pictures for continued performance in room and at home following d/c to continue progressing LE strength and endurance.   Exercises  Seated March - 15 reps - 3 sets - 2x daily - 7x weekly  Seated Long Arc Quad - 15 reps - 3 sets - 2x daily - 7x weekly  Seated Hip Adduction Isometrics with Ball - 15 reps - 3 sets - 2x daily - 7x weekly  Seated Hip Abduction with Resistance - 15 reps - 3 sets - 2x daily -  7x weekly  Sit to Stand - 5 reps - 3 sets - 2x daily - 7x weekly  Standing Heel Raise with Support - 15 reps - 3 sets - 2x daily - 7x weekly  Standing Hip Abduction with Counter Support - 10 reps - 3 sets - 2x daily - 7x weekly   Gait forward/backward/side stepping R/L no UE support with min guard 2-3 sets of 10-15' each; rest breaks between d/t fatigue. Forward walking, no UE support while performing dynamic head turns to reduce visual reliance; minor instability and slower speed noted. Gait weaving around cones min guard x3 trials with cones progressively narrowed for increased challenge. Vitals during rest break 157/77 HR 99 O2 98% on room air. Gait to return to room with RW and S. Toileting with S for balance in standing, RW for in/out of bathroom. Remained in recliner at end of session, all needs in reach. Discussed progress and goals with wife.      Therapy Documentation Precautions:  Precautions Precautions: Fall Precaution Comments: watch SpO2 Restrictions Weight Bearing Restrictions: No   See Function Navigator for Current Functional Status.   Therapy/Group: Individual Therapy  [name] 03/02/2018, 9:50 AM

## 2018-03-02 NOTE — Progress Notes (Addendum)
[place] PHYSICAL MEDICINE & REHABILITATION     PROGRESS NOTE    Subjective/Complaints: Patient sitting sitting up in his chair this morning. He states he slept well overnight. He states he feels better after having a bowel movement yesterday. He requests his bed and chair alarm be turned off, discussed importance of safety.  ROS: Denies CP, SOB, N/V/D   Objective:  No results found. Recent Labs    02/28/18 0733  WBC 19.2*  HGB 14.6  HCT 46.3  PLT 342   Recent Labs    02/28/18 0529  NA 136  K 4.5  CL 101  GLUCOSE 138*  BUN 22  CREATININE 1.46*  CALCIUM 9.1   CBG (last 3)  No results for input(s): GLUCAP in the last 72 hours.  Wt Readings from Last 3 Encounters:  03/02/18 120.7 kg  02/25/18 118.6 kg  01/31/18 (!) 136.4 kg     Intake/Output Summary (Last 24 hours) at 03/02/2018 6301 Last data filed at 03/02/2018 0801 Gross per 24 hour  Intake 1330 ml  Output -  Net 1330 ml    Vital Signs: Blood pressure (!) 144/91, pulse 94, temperature 97.7 F (36.5 C), temperature source Oral, resp. rate (!) 22, height 5\' 10"  (1.778 m), weight 120.7 kg, SpO2 100 %. Physical Exam:  Constitutional: No distress . Vital signs reviewed. HENT: Normocephalic.  Atraumatic. Eyes: EOMI. No discharge. Cardiovascular: irregularly irregular. No JVD. Respiratory: CTA bilaterally. Normal effort. GI: BS +. Non-distended. Musculoskeletal: He exhibits LE edema  Neurological: He is alert and oriented Motor: B/l UE 4/5, stable B/l LE: 4/5 HF, KE and 4/5 ADF/PF, stable.  Skin: Skin is warm and dry.  Psychiatric: normal mood. Normal behavior.  Assessment/Plan: 1. Functional deficits secondary to debility which require 3+ hours per day of interdisciplinary therapy in a comprehensive inpatient rehab setting. Physiatrist is providing close team supervision and 24 hour management of active medical problems listed below. Physiatrist and rehab team continue to assess barriers to  discharge/monitor patient progress toward functional and medical goals.  Function:  Bathing Bathing position   Position: Shower  Bathing parts Body parts bathed by patient: Right arm, Left arm, Chest, Abdomen, Front perineal area, Buttocks, Right upper leg, Left upper leg, Right lower leg, Left lower leg Body parts bathed by helper: Right lower leg, Left lower leg, Back  Bathing assist Assist Level: Supervision or verbal cues      Upper Body Dressing/Undressing Upper body dressing   What is the patient wearing?: Pull over shirt/dress     Pull over shirt/dress - Perfomed by patient: Thread/unthread right sleeve, Put head through opening, Thread/unthread left sleeve, Pull shirt over trunk Pull over shirt/dress - Perfomed by helper: Pull shirt over trunk        Upper body assist Assist Level: Set up      Lower Body Dressing/Undressing Lower body dressing   What is the patient wearing?: Pants, Non-skid slipper socks     Pants- Performed by patient: Thread/unthread right pants leg, Pull pants up/down, Thread/unthread left pants leg Pants- Performed by helper: Thread/unthread left pants leg   Non-skid slipper socks- Performed by helper: Don/doff right sock, Don/doff left sock                  Lower body assist Assist for lower body dressing: Supervision or verbal cues      Toileting Toileting   Toileting steps completed by patient: Adjust clothing prior to toileting, Performs perineal hygiene, Adjust clothing after toileting Toileting  steps completed by helper: Adjust clothing prior to toileting, Performs perineal hygiene, Adjust clothing after toileting(per Candice, NT report) Toileting Assistive Devices: Grab bar or rail  Toileting assist Assist level: Supervision or verbal cues   Transfers Chair/bed transfer   Chair/bed transfer method: Stand pivot Chair/bed transfer assist level: Supervision or verbal cues Chair/bed transfer assistive device: Armrests, Environmental health practitioner     Max distance: 150 Assist level: Supervision or verbal cues   Wheelchair          Cognition Comprehension Comprehension assist level: Follows complex conversation/direction with no assist  Expression Expression assist level: Expresses complex ideas: With no assist  Social Interaction Social Interaction assist level: Interacts appropriately with others with medication or extra time (anti-anxiety, antidepressant).  Problem Solving Problem solving assist level: Solves complex problems: Recognizes & self-corrects  Memory Memory assist level: Complete Independence: No helper   Medical Problem List and Plan:  1. Debility secondary to pneumonia/respiratory failure.   Cont CIR 2. DVT Prophylaxis/Anticoagulation: Chronic Eliquis  3. Pain Management: Tylenol as needed, rx gout as below  4. Mood: Lexapro 20 mg daily, Pamelor 75 mg nightly  5. Neuropsych: This patient is ?fully capable of making decisions on his own behalf.  6. Skin/Wound Care: Routine skin checks  7. Fluids/Electrolytes/Nutrition: Routine in and outs  8. Atrial fibrillation status post ablation. Continue Eliquis. Cardiac rate controlled  9. CKD stage III. Monitor labs, I's and O's   AKI  improving, creatinine 1.46 on 9/2  Continue to monitor 10. MRSA PCR screening positive. Contact precautions  11. Leukocytosis. Antibiotic therapy completed.Recent blood cultures negative. Follow-up infectious disease as needed   WBC is 19.2 on 9/2  Afebrile  Confounded by steroids  Will order repeat labs at the end of the week  Cont to monitor  12. Hypothyroidism. Synthroid  13. Obesity. BMI 38.75. Dietary follow-up  14. Hx of gout, ?flare in forefeet.  Prednisone 8/31 - 9/3 15. Steroid induced hyperglycemia  Continue to monitor 16. Labile blood pressure  Labile on 9/4  LOS (Days) 4 A FACE TO FACE EVALUATION WAS PERFORMED  Ankit Lorie Phenix, MD 03/02/2018 8:22 AM

## 2018-03-03 ENCOUNTER — Inpatient Hospital Stay (HOSPITAL_COMMUNITY): Payer: Medicare Other | Admitting: Physical Therapy

## 2018-03-03 ENCOUNTER — Inpatient Hospital Stay (HOSPITAL_COMMUNITY): Payer: Medicare Other

## 2018-03-03 ENCOUNTER — Inpatient Hospital Stay (HOSPITAL_COMMUNITY): Payer: Medicare Other | Admitting: Occupational Therapy

## 2018-03-03 DIAGNOSIS — M7989 Other specified soft tissue disorders: Secondary | ICD-10-CM

## 2018-03-03 DIAGNOSIS — T148XXA Other injury of unspecified body region, initial encounter: Secondary | ICD-10-CM

## 2018-03-03 DIAGNOSIS — N179 Acute kidney failure, unspecified: Secondary | ICD-10-CM

## 2018-03-03 DIAGNOSIS — M10079 Idiopathic gout, unspecified ankle and foot: Secondary | ICD-10-CM

## 2018-03-03 MED ORDER — COLCHICINE 0.6 MG PO TABS
0.6000 mg | ORAL_TABLET | Freq: Two times a day (BID) | ORAL | Status: DC
  Administered 2018-03-04 – 2018-03-05 (×3): 0.6 mg via ORAL
  Filled 2018-03-03 (×3): qty 1

## 2018-03-03 MED ORDER — COLCHICINE 0.6 MG PO TABS
0.6000 mg | ORAL_TABLET | Freq: Three times a day (TID) | ORAL | Status: AC
  Administered 2018-03-03 (×3): 0.6 mg via ORAL
  Filled 2018-03-03 (×3): qty 1

## 2018-03-03 NOTE — Progress Notes (Signed)
[place] PHYSICAL MEDICINE & REHABILITATION     PROGRESS NOTE    Subjective/Complaints: Patient seen sitting up in his chair this morning. He states he slept well overnight. He notes feet pain. Informed by nursing as well as PA regarding foot pain overnight. Patient states he has a history of gout and this feels like gout.  ROS: Denies CP, SOB, N/V/D   Objective:  Dg Foot 2 Views Left  Result Date: 03/02/2018 CLINICAL DATA:  Left foot edema and redness EXAM: LEFT FOOT - 2 VIEW COMPARISON:  None. FINDINGS: There is severe degenerative change of the first metatarsophalangeal joint. No acute fracture. No dislocation. Prominent soft tissue swelling over the dorsum of the forefoot. IMPRESSION: No acute bony pathology. Degenerative change. Soft tissue swelling at the dorsum of the forefoot. Electronically Signed   By: Marybelle Killings M.D.   On: 03/02/2018 21:35   No results for input(s): WBC, HGB, HCT, PLT in the last 72 hours. No results for input(s): NA, K, CL, GLUCOSE, BUN, CREATININE, CALCIUM in the last 72 hours.  Invalid input(s): CO CBG (last 3)  No results for input(s): GLUCAP in the last 72 hours.  Wt Readings from Last 3 Encounters:  03/02/18 120.7 kg  02/25/18 118.6 kg  01/31/18 (!) 136.4 kg     Intake/Output Summary (Last 24 hours) at 03/03/2018 0804 Last data filed at 03/02/2018 1814 Gross per 24 hour  Intake 360 ml  Output -  Net 360 ml    Vital Signs: Blood pressure 104/68, pulse 86, temperature 98.3 F (36.8 C), temperature source Oral, resp. rate 19, height 5\' 10"  (1.778 m), weight 120.7 kg, SpO2 97 %. Physical Exam:  Constitutional: No distress . Vital signs reviewed. HENT: Normocephalic.  Atraumatic. Eyes: EOMI. No discharge. Cardiovascular: irregularly irregular. No JVD. Respiratory: CTA bilaterally. Normal effort. GI: BS +. Non-distended. Musculoskeletal: He exhibits LE edema and TTP in bilateral feet, left greater than right Neurological: He is alert and  oriented Motor: B/l UE 4/5, improving B/l LE: 4/5 HF, KE and 4/5 ADF/PF, improving.  Skin: Skin is warm and dry. Erythema in feet, left greater than right Psychiatric: normal mood. Normal behavior.  Assessment/Plan: 1. Functional deficits secondary to debility which require 3+ hours per day of interdisciplinary therapy in a comprehensive inpatient rehab setting. Physiatrist is providing close team supervision and 24 hour management of active medical problems listed below. Physiatrist and rehab team continue to assess barriers to discharge/monitor patient progress toward functional and medical goals.  Function:  Bathing Bathing position   Position: Shower  Bathing parts Body parts bathed by patient: Right arm, Left arm, Chest, Abdomen, Front perineal area, Buttocks, Right upper leg, Left upper leg, Right lower leg, Left lower leg, Back Body parts bathed by helper: Right lower leg, Left lower leg, Back  Bathing assist Assist Level: Supervision or verbal cues      Upper Body Dressing/Undressing Upper body dressing   What is the patient wearing?: Pull over shirt/dress     Pull over shirt/dress - Perfomed by patient: Thread/unthread right sleeve, Put head through opening, Thread/unthread left sleeve, Pull shirt over trunk Pull over shirt/dress - Perfomed by helper: Pull shirt over trunk        Upper body assist Assist Level: Set up   Set up : To obtain clothing/put away  Lower Body Dressing/Undressing Lower body dressing   What is the patient wearing?: Pants, Shoes, Ted Hose     Pants- Performed by patient: Thread/unthread right pants leg, Pull  pants up/down, Thread/unthread left pants leg Pants- Performed by helper: Thread/unthread left pants leg   Non-skid slipper socks- Performed by helper: Don/doff right sock, Don/doff left sock     Shoes - Performed by patient: Don/doff right shoe, Don/doff left shoe, Fasten right, Fasten left(Shoe fastner)            Lower body  assist Assist for lower body dressing: Supervision or verbal cues      Toileting Toileting   Toileting steps completed by patient: Adjust clothing prior to toileting, Performs perineal hygiene, Adjust clothing after toileting Toileting steps completed by helper: Adjust clothing prior to toileting, Performs perineal hygiene, Adjust clothing after toileting(per Candice, NT report) Toileting Assistive Devices: Grab bar or rail  Toileting assist Assist level: Supervision or verbal cues   Transfers Chair/bed transfer   Chair/bed transfer method: Stand pivot Chair/bed transfer assist level: Supervision or verbal cues Chair/bed transfer assistive device: Armrests, Medical sales representative     Max distance: 150 Assist level: Supervision or verbal cues   Wheelchair          Cognition Comprehension Comprehension assist level: Follows complex conversation/direction with no assist  Expression Expression assist level: Expresses complex ideas: With no assist  Social Interaction Social Interaction assist level: Interacts appropriately with others with medication or extra time (anti-anxiety, antidepressant).  Problem Solving Problem solving assist level: Solves complex problems: Recognizes & self-corrects  Memory Memory assist level: Complete Independence: No helper   Medical Problem List and Plan:  1. Debility secondary to pneumonia/respiratory failure.   Cont CIR 2. DVT Prophylaxis/Anticoagulation: Chronic Eliquis  3. Pain Management: Tylenol as needed, rx gout as below  4. Mood: Lexapro 20 mg daily, Pamelor 75 mg nightly  5. Neuropsych: This patient is ?fully capable of making decisions on his own behalf.  6. Skin/Wound Care: Routine skin checks  7. Fluids/Electrolytes/Nutrition: Routine in and outs  8. Atrial fibrillation status post ablation. Continue Eliquis. Cardiac rate controlled  9. CKD stage III. Monitor labs, I's and O's   AKI  improving, creatinine 1.46 on 9/2  Labs  ordered for tomorrow  Continue to monitor 10. MRSA PCR screening positive. Contact precautions  11. Leukocytosis. Antibiotic therapy completed.Recent blood cultures negative. Follow-up infectious disease as needed   WBC is 19.2 on 9/2  Afebrile  Confounded by steroids  Labs ordered for tomorrow  Cont to monitor  12. Hypothyroidism. Synthroid  13. Obesity. BMI 38.75. Dietary follow-up  14. Hx of gout, ?flare in forefeet.  Improved with prednisone 8/31 - 9/3  Colchicine increased on 9/5  X-rays on 9/4 reviewed, showing degenerative changes and swelling. 15. Steroid induced hyperglycemia  Continue to monitor 16. Labile blood pressure  Labile on 9/5  Orthostatics ordered  LOS (Days) 5 A FACE TO FACE EVALUATION WAS PERFORMED  Altheria Shadoan Lorie Phenix, MD 03/03/2018 8:04 AM

## 2018-03-03 NOTE — Progress Notes (Signed)
VASCULAR LAB PRELIMINARY  PRELIMINARY  PRELIMINARY  PRELIMINARY  Left lower extremity venous duplex completed.    Preliminary report:  There is no DVT or SVT noted in the left lower extremity.  There is a hematoma noted in the left thihg.   Davie Claud, RVT 03/03/2018, 12:05 PM

## 2018-03-03 NOTE — Progress Notes (Signed)
Went to put patient on Cpap.  Patient usually stays up late before going on.  Patient had just had pain meds and was asleep.  RN said she would call me if he woke up and wanted it on.

## 2018-03-03 NOTE — Progress Notes (Signed)
Social Work  Discharge Note  The overall goal for the admission was met for: DC-SAT 9/7  Discharge location: Yes-HOME WITH WIFE WHO CAN PROVIDE 24 HR SUPERVISION   Length of Stay: Yes-7 DAYS  Discharge activity level: Yes-MOD/I-SUPERVISION LEVEL  Home/community participation: Yes  Services provided included: MD, RD, PT, OT, RN, CM, Pharmacy and SW  Financial Services: Private Insurance: [facility]  Follow-up services arranged: Home Health: [facility] and Patient/Family has no preference for HH/DME agencies ORDERED [facility]  Comments (or additional information):WIFE WAS IN FOR EDUCATION ON Friday AND BOTH FEEL READY FOR PT TO [place]. PLEASED WITH THE PROGRESS HE HAS MADE WHILE HERE ON CIR. FAMILY EDUCATION COMPLETED Friday 9/6 WITH WIFE  Patient/Family verbalized understanding of follow-up arrangements: Yes  Individual responsible for coordination of the follow-up plan: SELF & JOANNE-WIFE  Confirmed correct DME delivered: Elease Hashimoto 03/03/2018    Elease Hashimoto

## 2018-03-03 NOTE — Progress Notes (Signed)
Physical Therapy Session Note  Patient Details  Name: Wayne Booth MRN: [id number] Date of Birth: 1940-11-30  Today's Date: 03/03/2018 PT Individual Time: 0910-1030 and 1430-1530 PT Individual Time Calculation (min): 80 min and 60 min (total 140 min)   Short Term Goals: Week 1:  PT Short Term Goal 1 (Week 1): STG=LTG due to ELOS  Skilled Therapeutic Interventions/Progress Updates: Tx 1: Pt received seated in recliner, c/o worsening gout pain B feet, pre-medicated and agreeable to treatment but requests to "tale it easy" and reduce amount of weight bearing exercises today. Respiratory arrived for breathing treatment; missed 10 min. Gait to gym with RW and S. Instructed in LE strengthening seated/supine including long arc quads, hip flexion marching, hip adduction/abduction isometrics, bridging, straight leg raise, crunches, sidelying hip abduction, prone lying on elbows x2 min, prone hamstring curls, prone straight leg raise. MinA prone>sitting d/t fatigue, SOB and decreased core strength/coordination. Sit <>stand no UE support 2x7 reps. Supine hip flexor stretch off edge of mat x2 min BLE. Gait to return to room with RW and S; remained seated in chair with alarm intact, all needs in reach.   Tx 2: Pt received in recliner; denies pain and agreeable to treatment. Gait to day room x250' total with RW and S; requires seated rest break at approximately 150' d/t SOB. Performed nustep x10 min total, BUE/BLE on level 4 with average 50 steps/min for focus on aerobic endurance. Gait x150' with RW and S. Seated BUE shuttle pass for UE strengthening and aerobic endurance; tolerates approx 1 min before fatigued and SOB. Standing balance on airex foam pad while performing card matching task with dynamic reaching outside BOS. Returned to room with gait S with RW x150'. Discussed pt progress with son, recommendation for continued use of RW d/t impaired balance and aerobic deconditioning. Remained in recliner at end  of session, all needs in reach.      Therapy Documentation Precautions:  Precautions Precautions: Fall Precaution Comments: watch SpO2 Restrictions Weight Bearing Restrictions: No   See Function Navigator for Current Functional Status.   Therapy/Group: Individual Therapy  [name] 03/03/2018, 11:26 AM

## 2018-03-03 NOTE — Progress Notes (Signed)
Occupational Therapy Session Note  Patient Details  Name: Wayne Booth MRN: [id number] Date of Birth: January 16, 1941  Today's Date: 03/03/2018 OT Individual Time: 1305-1400 OT Individual Time Calculation (min): 55 min    Short Term Goals: Week 1:  OT Short Term Goal 1 (Week 1): STG=LTG d/t ELOS  Skilled Therapeutic Interventions/Progress Updates:    Treatment session with focus on ADL retraining and activity tolerance during self-care tasks.  Pt received upright in recliner reporting pain in bilateral feet, but "improved" from this AM.  Pt expressing desire to shave and shower.  Pt ambulated to sink with RW and completed shaving task in sitting due to B feet pain.  Pt reports that depending on how he is feeling he may shave in sitting or standing at home.  Ambulated to bathroom with RW and completed toileting tasks in standing with UE support on RW.  Transferred to shower with RW with improved awareness of positioning of RW during transitional movements.  Pt completed bathing at sit > stand level in room shower without assist.  Pt attempted to don socks with personal sock aid, however due to feet still damp post shower unable to don socks. Pt reports he typically wears slip on loafers or shoes without socks.  Ambulated to ADL apt and completed simulated simple meal prep at overall supervision level.  Pt gathered items from refrigerator and high and low cabinets to simulate scrambled eggs.  Pt utilized counter tops for UE support and educated on sliding items along counters as appropriate to increase safety when transporting items.  Educated on energy conservation strategies during meal prep tasks with pt reporting he utilized some strategies prior.  Ambulated back to room with RW, demonstrating improved safety awareness when navigating obstacles in hallway and in room.  Pt left upright in recliner with all needs in reach.  Therapy Documentation Precautions:  Precautions Precautions: Fall Precaution  Comments: watch SpO2 Restrictions Weight Bearing Restrictions: No General:   Vital Signs: Therapy Vitals Temp: (!) 97.5 F (36.4 C) Temp Source: Oral Pulse Rate: 95 Resp: 19 BP: 118/70 Patient Position (if appropriate): Sitting Oxygen Therapy SpO2: 96 % O2 Device: Room Air Pain: Pain Assessment Pain Scale: 0-10 Pain Score: 6 Pain Type: Acute pain Pain Location: Foot Pain Orientation: Left Pain Descriptors / Indicators: Aching Pain Frequency: Constant Pain Intervention(s): Premedicated  See Function Navigator for Current Functional Status.   Therapy/Group: Individual Therapy  Simonne Come 03/03/2018, 3:50 PM

## 2018-03-04 ENCOUNTER — Inpatient Hospital Stay (HOSPITAL_COMMUNITY): Payer: Medicare Other | Admitting: Physical Therapy

## 2018-03-04 ENCOUNTER — Encounter (HOSPITAL_COMMUNITY): Payer: Medicare Other | Admitting: Occupational Therapy

## 2018-03-04 ENCOUNTER — Ambulatory Visit (HOSPITAL_COMMUNITY): Payer: Medicare Other | Admitting: Physical Therapy

## 2018-03-04 ENCOUNTER — Inpatient Hospital Stay (HOSPITAL_COMMUNITY): Payer: Medicare Other | Admitting: Occupational Therapy

## 2018-03-04 LAB — CBC WITH DIFFERENTIAL/PLATELET
ABS IMMATURE GRANULOCYTES: 0.1 10*3/uL (ref 0.0–0.1)
BASOS PCT: 1 %
Basophils Absolute: 0.1 10*3/uL (ref 0.0–0.1)
Eosinophils Absolute: 0.9 10*3/uL — ABNORMAL HIGH (ref 0.0–0.7)
Eosinophils Relative: 12 %
HCT: 40.5 % (ref 39.0–52.0)
Hemoglobin: 13 g/dL (ref 13.0–17.0)
Immature Granulocytes: 1 %
Lymphocytes Relative: 21 %
Lymphs Abs: 1.6 10*3/uL (ref 0.7–4.0)
MCH: 29.6 pg (ref 26.0–34.0)
MCHC: 32.1 g/dL (ref 30.0–36.0)
MCV: 92.3 fL (ref 78.0–100.0)
MONO ABS: 0.9 10*3/uL (ref 0.1–1.0)
Monocytes Relative: 12 %
NEUTROS ABS: 3.9 10*3/uL (ref 1.7–7.7)
NEUTROS PCT: 53 %
PLATELETS: 225 10*3/uL (ref 150–400)
RBC: 4.39 MIL/uL (ref 4.22–5.81)
RDW: 16.3 % — ABNORMAL HIGH (ref 11.5–15.5)
WBC: 7.3 10*3/uL (ref 4.0–10.5)

## 2018-03-04 LAB — BASIC METABOLIC PANEL
ANION GAP: 7 (ref 5–15)
BUN: 20 mg/dL (ref 8–23)
CALCIUM: 8.6 mg/dL — AB (ref 8.9–10.3)
CO2: 29 mmol/L (ref 22–32)
Chloride: 103 mmol/L (ref 98–111)
Creatinine, Ser: 1.37 mg/dL — ABNORMAL HIGH (ref 0.61–1.24)
GFR calc non Af Amer: 49 mL/min — ABNORMAL LOW (ref >60)
GFR, EST AFRICAN AMERICAN: 57 mL/min — AB (ref >60)
GLUCOSE: 101 mg/dL — AB (ref 70–99)
Potassium: 4.8 mmol/L (ref 3.5–5.1)
Sodium: 139 mmol/L (ref 135–145)

## 2018-03-04 MED ORDER — LEVOTHYROXINE SODIUM 150 MCG PO TABS: 150.0000 ug | ORAL_TABLET | Freq: Every day | ORAL | 2 refills | Status: DC

## 2018-03-04 MED ORDER — APIXABAN 5 MG PO TABS: 5.0000 mg | ORAL_TABLET | Freq: Two times a day (BID) | ORAL | 1 refills | Status: DC

## 2018-03-04 MED ORDER — COLCHICINE 0.6 MG PO TABS: 0.6000 mg | ORAL_TABLET | Freq: Two times a day (BID) | ORAL | 0 refills | Status: DC

## 2018-03-04 MED ORDER — CYCLOBENZAPRINE HCL 10 MG PO TABS: 10.0000 mg | ORAL_TABLET | Freq: Three times a day (TID) | ORAL | 0 refills | Status: DC | PRN

## 2018-03-04 MED ORDER — TRAMADOL HCL 50 MG PO TABS: 50.0000 mg | ORAL_TABLET | Freq: Four times a day (QID) | ORAL | 0 refills | Status: DC | PRN

## 2018-03-04 MED ORDER — NORTRIPTYLINE HCL 75 MG PO CAPS: 75.0000 mg | ORAL_CAPSULE | Freq: Every day | ORAL | 1 refills | Status: DC

## 2018-03-04 MED ORDER — ALBUTEROL SULFATE HFA 108 (90 BASE) MCG/ACT IN AERS: 2.0000 | INHALATION_SPRAY | Freq: Four times a day (QID) | RESPIRATORY_TRACT | 5 refills | Status: DC | PRN

## 2018-03-04 MED ORDER — ESCITALOPRAM OXALATE 20 MG PO TABS: 20.0000 mg | ORAL_TABLET | Freq: Every day | ORAL | 3 refills | Status: DC

## 2018-03-04 MED ORDER — POLYETHYLENE GLYCOL 3350 17 G PO PACK: 17.0000 g | PACK | Freq: Every day | ORAL | 0 refills | Status: DC

## 2018-03-04 NOTE — Progress Notes (Signed)
Occupational Therapy Session Note  Patient Details  Name: Wayne Booth MRN: [id number] Date of Birth: [birth year]  Today's Date: 03/04/2018 OT Individual Time: 1000-1100 OT Individual Time Calculation (min): 60 min    Short Term Goals: Week 1:  OT Short Term Goal 1 (Week 1): STG=LTG d/t ELOS  Skilled Therapeutic Interventions/Progress Updates:    Pt seen for OT ADL bathing/dressing session and scheduled family ed. Pt sitting upright in recliner upon arrival with wife present for family ed, pt denying pain and agreeable to tx session.  Extensive discussion and education provided to pt and wife regarding pt's currently level of function including deficitis and functional implications, DME and recommended positioning for shower/bathing tasks, energy conservation, fall risk, continuum of care, and d/c planning. Following education, pt and wife requesting BSC with plans to use as shower chair and over toilet PRN for increased safety and ease with transfers. Pt very reluctant to use AE, AD, and DME as he is concerned with looking "handicapped". Cont to provide education and encouragement to use AD etc for increased safety and current time until further therapy to progress to not using AD. Pt and wife voice understanding and agreement with therapist's recommendations.  Pt ambulated throughout session with RW and supervision, intermittent VCs for safety awareness and proper RW management. In ADL apartment, pt completed simulated shower stall transfer utilizing RW to [facility] in shower. VCs and supervision.  He returned to room and completed toileting task, able to complete sit<>Stands from standard height toilet and no grab bar in simulation of home environment.  He bathed seated on BSC in shower with intermittent supervision following set-up. He dressed seated in standard chair, cont to require cuing for safety to sit during dynamic portions and educated regarding fall risk. Pt left seated in chair at end  of session with wife present and hand off to PT.  Therapy Documentation Precautions:  Precautions Precautions: Fall Precaution Comments: watch SpO2 Restrictions Weight Bearing Restrictions: No  See Function Navigator for Current Functional Status.   Therapy/Group: Individual Therapy  Raphael Fitzpatrick L 03/04/2018, 7:50 AM

## 2018-03-04 NOTE — Discharge Instructions (Signed)
Inpatient Rehab Discharge Instructions  [place] Discharge date and time: No discharge date for patient encounter.   Activities/Precautions/ Functional Status: Activity: activity as tolerated Diet: regular diet Wound Care: none needed Functional status:  ___ No restrictions     ___ Walk up steps independently ___ 24/7 supervision/assistance   ___ Walk up steps with assistance ___ Intermittent supervision/assistance  ___ Bathe/dress independently ___ Walk with walker     _x__ Bathe/dress with assistance ___ Walk Independently    ___ Shower independently ___ Walk with assistance    ___ Shower with assistance ___ No alcohol     ___ Return to work/school ________  Special Instructions:   COMMUNITY REFERRALS UPON DISCHARGE:    Home Health:   PT & RN  [place]    Phone:  [phone]  Date of last service:03/05/2018  Medical Equipment/Items [place]   [phone]    My questions have been answered and I understand these instructions. I will adhere to these goals and the provided educational materials after my discharge from the hospital.  Patient/Caregiver Signature _______________________________ Date __________  Clinician Signature _______________________________________ Date __________  Please bring this form and your medication list with you to all your follow-up doctor's appointments.   Information on my medicine - ELIQUIS (apixaban)  This medication education was reviewed with me or my healthcare representative as part of my discharge preparation.  The pharmacist that spoke with me during my hospital stay was:  [name], RPH-CPP  Why was Eliquis prescribed for you? Eliquis was prescribed for you to reduce the risk of a blood clot forming that can cause a stroke if you have a medical condition called atrial fibrillation (a type of irregular heartbeat).  What do You need to know  about Eliquis ? Take your Eliquis TWICE DAILY - one tablet in the morning and one tablet in the evening with or without food. If you have difficulty swallowing the tablet whole please discuss with your pharmacist how to take the medication safely.  Take Eliquis exactly as prescribed by your doctor and DO NOT stop taking Eliquis without talking to the doctor who prescribed the medication.  Stopping may increase your risk of developing a stroke.  Refill your prescription before you run out.  After discharge, you should have regular check-up appointments with your healthcare provider that is prescribing your Eliquis.  In the future your dose may need to be changed if your kidney function or weight changes by a significant amount or as you get older.  What do you do if you miss a dose? If you miss a dose, take it as soon as you remember on the same day and resume taking twice daily.  Do not take more than one dose of ELIQUIS at the same time to make up a missed dose.  Important Safety Information A possible side effect of Eliquis is bleeding. You should call your healthcare provider right away if you experience any of the following: ? Bleeding from an injury or your nose that does not stop. ? Unusual colored urine (red or dark brown) or unusual colored stools (red or black). ? Unusual bruising for unknown reasons. ? A serious fall or if you hit your head (even if there is no bleeding).  Some medicines may interact with Eliquis and might increase your risk of bleeding or clotting while on Eliquis. To help avoid this, consult your healthcare provider or pharmacist prior to using any  new prescription or non-prescription medications, including herbals, vitamins, non-steroidal anti-inflammatory drugs (NSAIDs) and supplements.  This website has more information on Eliquis (apixaban): http://www.eliquis.com/eliquis/home

## 2018-03-04 NOTE — Progress Notes (Signed)
RT talked to pt about CPAP.  Pt does have his home CPAP but states he doesn't use it because its not wokrig right.  RT offered to bring a CPAP for him to use tonight, but pt refused it.  Stated he will e going home tomorrow.

## 2018-03-04 NOTE — Progress Notes (Signed)
Physical Therapy Discharge Summary  Patient Details  Name: Wayne Booth MRN: [id number] Date of Birth: [birth year]  Today's Date: 03/04/2018 PT Individual Time: 1100-1200 PT Individual Time Calculation (min): 60 min    Patient has met 8 of 8 long term goals due to improved activity tolerance, improved balance, improved postural control, increased strength, increased range of motion and ability to compensate for deficits.  Patient to discharge at an ambulatory level Supervision.   Patient's care partner is independent to provide the necessary physical assistance at discharge.  Reasons goals not met: All goals met  Recommendation:  Patient will benefit from ongoing skilled PT services in home health setting to continue to advance safe functional mobility, address ongoing impairments in strength, balance, activity tolerance, ROM, and minimize fall risk.  Equipment: No equipment provided  Reasons for discharge: treatment goals met and discharge from hospital  Patient/family agrees with progress made and goals achieved: Yes  PT Discharge Precautions/Restrictions Precautions Precautions: Fall Restrictions Weight Bearing Restrictions: No Vital Signs Therapy Vitals Pulse Rate: 91 Resp: 12 BP: (!) 142/85 Patient Position (if appropriate): Sitting Oxygen Therapy SpO2: 98 % O2 Device: Room Air Pain Pain Assessment Pain Scale: 0-10 Pain Score: 7  Pain Type: Acute pain Pain Location: Foot Pain Orientation: Left Pain Descriptors / Indicators: Aching;Discomfort Pain Frequency: Constant Patients Stated Pain Goal: 3 Pain Intervention(s): Medication (See eMAR) Vision/Perception  Perception Perception: Within Functional Limits Praxis Praxis: Intact  Cognition Overall Cognitive Status: Within Functional Limits for tasks assessed Arousal/Alertness: Awake/alert Orientation Level: Oriented X4 Memory: Appears intact Problem Solving: Appears intact Safety/Judgment:  Impaired Comments: Slightly impulsive with decreased safety awareness Sensation Sensation Light Touch: Appears Intact Proprioception: Appears Intact Motor  Motor Motor - Discharge Observations: generalized weakness, deconditioning  Mobility Bed Mobility Bed Mobility: Supine to Sit;Sit to Supine Supine to Sit: Independent Sit to Supine: Independent Transfers Transfers: Stand Pivot Transfers;Sit to Stand Sit to Stand: Independent with assistive device Stand Pivot Transfers: Independent with assistive device Transfer (Assistive device): Rolling walker Locomotion  Gait Ambulation: Yes Gait Assistance: Supervision/Verbal cueing Gait Distance (Feet): 150 Feet Assistive device: Rolling walker Gait Assistance Details: Verbal cues for technique;Verbal cues for precautions/safety Gait Gait: Yes Gait Pattern: Impaired Gait Pattern: Poor foot clearance - right;Poor foot clearance - left;Trunk flexed;Right foot flat;Left foot flat;Decreased stride length Gait velocity: 1.6 ft/sec Stairs / Additional Locomotion Stairs: Yes Stairs Assistance: Supervision/Verbal cueing Stair Management Technique: Two rails;Alternating pattern;Forwards Number of Stairs: 8 Height of Stairs: 6 Ramp: Supervision/Verbal cueing Curb: Supervision/Verbal cueing Wheelchair Mobility Wheelchair Mobility: No  Trunk/Postural Assessment  Cervical Assessment Cervical Assessment: Exceptions to WFL(forward head) Thoracic Assessment Thoracic Assessment: Exceptions to WFL(rounded shoulders, increased kyphosis) Lumbar Assessment Lumbar Assessment: Exceptions to WFL(reduced lumbar lordosis) Postural Control Postural Control: Deficits on evaluation(delayed/inefficient ankle/stepping strategies)  Balance Balance Balance Assessed: Yes Standardized Balance Assessment Standardized Balance Assessment: Berg Balance Test;Timed Up and Go Test Berg Balance Test Sit to Stand: Able to stand  independently using  hands Standing Unsupported: Able to stand safely 2 minutes Sitting with Back Unsupported but Feet Supported on Floor or Stool: Able to sit safely and securely 2 minutes Stand to Sit: Controls descent by using hands Transfers: Able to transfer safely, minor use of hands Standing Unsupported with Eyes Closed: Able to stand 10 seconds with supervision Standing Ubsupported with Feet Together: Able to place feet together independently and stand for 1 minute with supervision From Standing, Reach Forward with Outstretched Arm: Can reach forward >12 cm safely (5") From Standing Position, Pick  up Object from Floor: Able to pick up shoe, needs supervision From Standing Position, Turn to Look Behind Over each Shoulder: Looks behind one side only/other side shows less weight shift Turn 360 Degrees: Able to turn 360 degrees safely but slowly Standing Unsupported, Alternately Place Feet on Step/Stool: Able to complete >2 steps/needs minimal assist Standing Unsupported, One Foot in Front: Able to take small step independently and hold 30 seconds Standing on One Leg: Tries to lift leg/unable to hold 3 seconds but remains standing independently Total Score: 39 Timed Up and Go Test TUG: Normal TUG Normal TUG (seconds): 23(RW) Static Sitting Balance Static Sitting - Level of Assistance: 6: Modified independent (Device/Increase time) Dynamic Sitting Balance Dynamic Sitting - Balance Support: During functional activity;Feet supported Dynamic Sitting - Level of Assistance: 6: Modified independent (Device/Increase time) Sitting balance - Comments: Sitting EOB Static Standing Balance Static Standing - Balance Support: During functional activity;Right upper extremity supported;Left upper extremity supported Static Standing - Level of Assistance: 6: Modified independent (Device/Increase time) Static Stance: Eyes closed Static Stance: Eyes Closed: x30 sec with S Static Stance: on Foam: x30 sec with S Static  Stance: on Foam, Eyes Closed: unable, LOBs requiring mod/maxA to recover Dynamic Standing Balance Dynamic Standing - Balance Support: During functional activity;Right upper extremity supported;Left upper extremity supported Dynamic Standing - Level of Assistance: 5: Stand by assistance Dynamic Standing - Comments: Standing to complete LB bathing/dressing tasks Extremity Assessment   BUEs WFL for functional mobility tasks   RLE Assessment RLE Assessment: Within Functional Limits General Strength Comments: grossly 4+/5 to 5/5 throughout LLE Assessment LLE Assessment: Within Functional Limits General Strength Comments: grossly 4+/5 to 5/5 throughout  Skilled Therapeutic Intervention: Pt received in recliner, wife present for family education; denies pain and agreeable to treatment. Assessed all mobility as above with S overall using RW. Balance/gait outcome measures assessed with scores indicating increased risk for falls; reiterated to pt and wife recommendation for 24/7 S and continued use of RW until balance progresses with HH PT and next therapist recommends discontinuing RW use. Performed [place] A/B balance exercises and provided handout for continued performance at d/c. Remained seated in w/c at end of session. Wife and patient with no further questions regarding d/c home at this time.   See Function Navigator for Current Functional Status.  Elizabeth J Seigle 03/04/2018, 7:53 AM  

## 2018-03-04 NOTE — Progress Notes (Signed)
Occupational Therapy Discharge Summary  Patient Details  Name: Wayne Booth MRN: [id number] Date of Birth: [birth year]  Patient has met 9 of 9 long term goals due to improved activity tolerance, improved balance, postural control, ability to compensate for deficits and improved coordination.  Patient to discharge at overall Modified Independent level with supervision for shower transfers. He requires increased time and rest breaks for all ADLs/IADLs due to decreased functional activity tolerance .  Patient's care partner is independent to provide the necessary physical assistance at discharge.  Family education completed with pt's wife who will provide needed supervision assist at d/c and is capable of assisting with other ADLs PRN.  Recommend strict use of RW for all functional mobility. Also recommending completing bathing tasks from seated position in order to reduce fall risk and for energy conservation.  Pt and wife voice feeling comfortable and confident with planned d/c home and receiving PT services only at d/c in order to cont to increase functional balance and endurance in prep for ADLs/IADLs.    Recommendation:  Patient with no further OT needs. Will receive PT services to cont to increase functional standing balance/endurance to cont to increase independence and safety with ADLs/IADLs.  Equipment: 3-1 BSC and RW  Reasons for discharge: treatment goals met and discharge from hospital  Patient/family agrees with progress made and goals achieved: Yes  OT Discharge Precautions/Restrictions  Precautions Precautions: Fall Restrictions Weight Bearing Restrictions: No Vision Baseline Vision/History: No visual deficits Patient Visual Report: No change from baseline Vision Assessment?: No apparent visual deficits Perception  Perception: Within Functional Limits Praxis Praxis: Intact Cognition Overall Cognitive Status: Within Functional Limits for tasks  assessed Arousal/Alertness: Awake/alert Orientation Level: Oriented X4 Memory: Appears intact Problem Solving: Appears intact Safety/Judgment: Impaired Comments: Slightly impulsive with decreased safety awareness Sensation Sensation Light Touch: Appears Intact Proprioception: Appears Intact Motor  Motor Motor - Discharge Observations: generalized weakness, deconditioning Trunk/Postural Assessment  Cervical Assessment Cervical Assessment: Exceptions to WFL(Forward head) Thoracic Assessment Thoracic Assessment: Exceptions to WFL(Rounded shoulders; kyphotic) Lumbar Assessment Lumbar Assessment: Within Functional Limits Postural Control Postural Control: Within Functional Limits  Balance Balance Balance Assessed: Yes Static Sitting Balance Static Sitting - Level of Assistance: 6: Modified independent (Device/Increase time) Dynamic Sitting Balance Dynamic Sitting - Balance Support: During functional activity;Feet supported Sitting balance - Comments: Sitting EOB Static Standing Balance Static Standing - Balance Support: During functional activity;Right upper extremity supported;Left upper extremity supported Static Standing - Level of Assistance: 5: Stand by assistance Dynamic Standing Balance Dynamic Standing - Balance Support: During functional activity;Right upper extremity supported;Left upper extremity supported Dynamic Standing - Level of Assistance: 6: Modified independent (Device/Increase time) Dynamic Standing - Comments: Standing to complete LB bathing/dressing tasks Extremity/Trunk Assessment RUE Assessment RUE Assessment: Exceptions to [facility] General Strength Comments: 4+/5, tremors/ataxic pt reports PTA LUE Assessment LUE Assessment: Exceptions to [facility] Active Range of Motion (AROM) Comments: generalized weakness, decreased coordination/FMC   See Function Navigator for Current Functional Status.  Wayne Booth L 03/04/2018, 9:18 AM

## 2018-03-04 NOTE — Discharge Summary (Signed)
NAME: Wayne Booth, Wayne W. MEDICAL RECORD [id number] ACCOUNT [id number] DATE OF BIRTH:10-25-1940 FACILITY: [facility] LOCATION: [facility] PHYSICIAN:ANKIT PATEL, MD  DISCHARGE SUMMARY  DATE OF DISCHARGE:  03/05/2018  DISCHARGE DIAGNOSES: 1.  Debility secondary to pneumonia with respiratory failure. 2.  Gastrointestinal/deep venous thrombosis prophylaxis with chronic Eliquis. 3.  Depression.   4.  Chronic kidney disease, stage III.   5.  Methicillin resistant Staphylococcus aureus .  6.  PCR screening positive. 7.  Hypothyroidism 8.  Obesity. 9.  History of gout.  HOSPITAL COURSE:  A 77 year old right-handed male with history of atrial fibrillation status post ablation, maintained on Eliquis, sleep apnea, COPD, CKD stage III.  Lives with spouse, independent prior to admission use of occasional cane.  Two-level  home.  Presented 02/03/2018 with increasing shortness of breath, decreased mobility over a 5 day span associated right side pleuritic chest pain.  Chest x-ray showed right lung -mid lung zone consolidation concerning for pneumonia.  Perfusion scan, low  probability for pulmonary emboli.  CT of the chest showed bilateral multilobar pneumonia involving the right upper, middle and both lower lobes.    Mild hyponatremia 131.  Lactic acid 1.51.  Placed on broad-spectrum antibiotics.  Echocardiogram with ejection fraction of 55%, no wall motion abnormalities.  The patient did require intubation  02/12/2018 to 02/17/2018.  Leukocytosis 23,600 improved to  19,500.  Infectious disease consulted.  Completed course of antibiotics.  Advised to continue to monitor.  Contact precautions for MRSA PCR screening positive.  The patient was admitted for comprehensive rehabilitation program.  PAST MEDICAL HISTORY:  See discharge diagnoses.  SOCIAL HISTORY:  Lives with spouse, independent prior to admission.  FUNCTIONAL STATUS:  Upon admission to rehab services was +2 physical assist sit to stand, minimal  assist 40 feet rolling walker, mod/max assist with activities of daily living.  PHYSICAL EXAMINATION: VITAL SIGNS:  Blood pressure 130/87, pulse 85, temperature 97, respirations 18. GENERAL:  Alert male in no acute distress. HEENT:  EOMs intact. NECK:  Supple, nontender, no JVD. CARDIOVASCULAR:  Rate controlled. ABDOMEN:  Soft, nontender, good bowel sounds. LUNGS:  Clear to auscultation without wheeze.  REHABILITATION HOSPITAL COURSE:  The patient was admitted to inpatient rehabilitation services.  Therapies initiated on a 3-hour daily basis, consisting of physical therapy, occupational therapy and rehabilitation nursing.  The following issues were  addressed during patient's rehabilitation stay.    Pertaining to the patient's debility related to pneumonia and respiratory failure, he continued to progress nicely with rehabilitation therapies.  He remained on chronic Eliquis for history of atrial fibrillation status post ablation.  No chest pain or  shortness of breath.  Venous Doppler studies negative.    He continued on Lexapro on Pamelor for mood stabilization attending full therapies.    CKD stage III, creatinine 1.46.    Contact precautions for MRSA PCR screening positive.  He remained afebrile.  Noted leukocytosis, antibiotic therapy completed.  Recent blood cultures negative.    Obesity, BMI 38.75 with dietary followup.    Noted history of gout.  Mildly elevated uric acid.  He received a short blast of prednisone, placed on colchicine.  X-rays of the foot showed no acute changes.    The patient received weekly collaborative interdisciplinary team conferences to discuss estimated length of stay, family teaching, any barriers to discharge.  Working with energy conservation techniques.  Ambulating 150 feet up to 250 feet with a rolling  walker, supervision.  He can gather his belongings for activities of daily living and homemaking  navigate stairs.  He was advised no driving.  The  patient encouraged overall progress and planned discharge to home.  DISCHARGE MEDICATIONS:  Included Eliquis 5 mg p.o. b.i.d., colchicine 0.6 mg b.i.d., Colace 100 mg daily, Lexapro 20 mg p.o. daily, Synthroid 150 mcg daily, multivitamin daily, Pamelor 75 mg at bedtime, MiraLax daily, hold for loose stools, Ultram 50 mg  every 6 hours as needed for pain.    His diet was regular.    He would follow up with Dr. Delice Lesch at the outpatient rehab service office as directed; Dr. Rushie Chestnut, medical management; Dr. Irish Lack, call for appointment.  AN/NUANCE D:03/04/2018 T:03/04/2018 JOB:[id number]

## 2018-03-04 NOTE — Progress Notes (Signed)
Physical Therapy Session Note  Patient Details  Name: Wayne Booth MRN: [id number] Date of Birth: 10/29/1940  Today's Date: 03/04/2018 PT Individual Time: 1300-1345 PT Individual Time Calculation (min): 45 min   Short Term Goals: Week 1:  PT Short Term Goal 1 (Week 1): STG=LTG due to ELOS  Skilled Therapeutic Interventions/Progress Updates:    Pt received seated in w/c in room, agreeable to PT. Pt reports 8/10 pain in LLE, RN provides pain medication at beginning of therapy session. Pt transfers with Mod I throughout therapy session with RW. Ambulation 3 x 150 ft with RW and Supervision, seated rest break between each bout of ambulation. Supine BLE therex x 15 reps. Supine to sit with min A from flat surface, pt reports he will be sleeping in a recliner at home and will not need to perform bed mobility. Nustep level 5 x 5 min with BLE only. Pt left seated in recliner in room with needs in reach.  Therapy Documentation Precautions:  Precautions Precautions: Fall Precaution Comments: watch SpO2 Restrictions Weight Bearing Restrictions: No  See Function Navigator for Current Functional Status.   Therapy/Group: Individual Therapy  Excell Seltzer, PT, DPT  03/04/2018, 2:43 PM

## 2018-03-04 NOTE — Progress Notes (Signed)
Occupational Therapy Session Note  Patient Details  Name: Wayne Booth MRN: [id number] Date of Birth: 1940/11/06  Today's Date: 03/04/2018 OT Individual Time: 1740-1810 OT Individual Time Calculation (min): 30 min   Skilled Therapeutic Interventions/Progress Updates: Patient stated he was fatigued but concurred to participate.   Focus was on safety of walker and body placement during functional mobility.  He demonstrated safe use for functional tasks in his room and in the ADL kitchen.   He required distant S to Mod I.     At end of session, patient was supervised as he walked to his recliner and put his feet up as he stated she needed a nap.   His call bell and phone were placed within his reach.     Therapy Documentation Precautions:  Precautions Precautions: Fall Precaution Comments: watch SpO2 Restrictions Weight Bearing Restrictions: No   Pain:denied  See Function Navigator for Current Functional Status.   Therapy/Group: Individual Therapy  Alfredia Ferguson [facility] 03/04/2018, 8:52 PM

## 2018-03-04 NOTE — Discharge Summary (Signed)
Discharge summary job [id number]

## 2018-03-05 NOTE — Progress Notes (Signed)
Patient discharge spouse by side, voices understanding of discharge instructions. Discharge by staff via wheelchair. No distress observed.

## 2018-03-05 NOTE — Progress Notes (Signed)
Patient ID: Wayne Booth, male   DOB: Mar 04, 1941, 77 y.o.   MRN: [id number]    Wayne Booth is a 77 y.o. male  Subjective: No new complaints. No new problems. Excited about discharge this morning.  No pulmonary complaints  Past Medical History:  Diagnosis Date  . Allergy    States his nose runs when he is around grease.   Marland Kitchen Anxiety   . Arthritis   . Atrial fibrillation ([place])   . Cancer ([place])   . Coronary artery disease   . History of total knee replacement   . Hypertension   . Hypothyroidism   . Kidney cancer, primary, with metastasis from kidney to other site [facility])   . Kidney stone   . OSA (obstructive sleep apnea)    pt does not wear cpap at night  . Pituitary cyst ([place])   . Pneumonia    hx of  . Prostate cancer ([place])      Objective: Vital signs in last 24 hours: Temp:  [97.7 F (36.5 C)-98.6 F (37 C)] 98.6 F (37 C) (09/07 0458) Pulse Rate:  [86-92] 92 (09/07 0458) Resp:  [15-19] 19 (09/07 0458) BP: (107-135)/(71-80) 135/80 (09/07 0458) SpO2:  [95 %-98 %] 95 % (09/07 0458) Weight change:  Last BM Date: 03/02/18  Intake/Output from previous day: 09/06 0701 - 09/07 0700 In: 1026 [P.O.:1026] Out: -  Last cbgs: CBG (last 3)  No results for input(s): GLUCAP in the last 72 hours.   Physical Exam General: No apparent distress morbidly obese HEENT: not dry Lungs: Normal effort. Lungs clear to auscultation, no crackles or wheezes. Cardiovascular: Regular rate and rhythm, no edema Abdomen: S/NT/ND; BS(+) Musculoskeletal:  unchanged Neurological: No new neurological deficits Wounds: N/A     Mental state: Alert, oriented, cooperative    Lab Results: BMET    Component Value Date/Time   NA 139 03/04/2018 0737   NA 136 01/21/2018 0000   K 4.8 03/04/2018 0737   CL 103 03/04/2018 0737   CO2 29 03/04/2018 0737   GLUCOSE 101 (H) 03/04/2018 0737   BUN 20 03/04/2018 0737   BUN 15 01/21/2018 0000   CREATININE 1.37 (H) 03/04/2018 0737   CREATININE  1.16 11/27/2016 1555   CALCIUM 8.6 (L) 03/04/2018 0737   GFRNONAA 49 (L) 03/04/2018 0737   GFRAA 57 (L) 03/04/2018 0737   CBC    Component Value Date/Time   WBC 7.3 03/04/2018 0737   RBC 4.39 03/04/2018 0737   HGB 13.0 03/04/2018 0737   HGB 16.0 01/21/2018 0000   HCT 40.5 03/04/2018 0737   HCT 46.7 01/21/2018 0000   PLT 225 03/04/2018 0737   PLT 341 01/21/2018 0000   MCV 92.3 03/04/2018 0737   MCV 91 01/21/2018 0000   MCH 29.6 03/04/2018 0737   MCHC 32.1 03/04/2018 0737   RDW 16.3 (H) 03/04/2018 0737   RDW 14.4 01/21/2018 0000   LYMPHSABS 1.6 03/04/2018 0737   MONOABS 0.9 03/04/2018 0737   EOSABS 0.9 (H) 03/04/2018 0737   BASOSABS 0.1 03/04/2018 0737    Medications: I have reviewed the patient's current medications.  Assessment/Plan:  Debility secondary to pneumonia and respiratory failure.  Stable for discharge Atrial fibrillation.  Status post ablation.  Continue anticoagulation Chronic kidney disease  Morbid obesity  Length of stay, days: 7  Marletta Lor , MD 03/05/2018, 7:50 AM

## 2018-03-05 NOTE — Plan of Care (Signed)
All goals met and patient discharge at this time. Voices understand of discharge instructions.

## 2018-03-07 ENCOUNTER — Telehealth: Payer: Self-pay | Admitting: *Deleted

## 2018-03-07 NOTE — Telephone Encounter (Signed)
Per chart review: DATE OF DISCHARGE:  03/05/2018  DISCHARGE DIAGNOSES: 1.  Debility secondary to pneumonia with respiratory failure. 2.  Gastrointestinal/deep venous thrombosis prophylaxis with chronic Eliquis. 3.  Depression.   4.  Chronic kidney disease, stage III.   5.  Methicillin resistant Staphylococcus aureus .  6.  PCR screening positive. 7.  Hypothyroidism 8.  Obesity. 9.  History of gout. ______________________________________________________________________ Per telephone call: Transition Care Management Follow-up Telephone Call   Date discharged? 03/05/18   How have you been since you were released from the hospital? "pretty good"   Do you understand why you were in the hospital? yes   Do you understand the discharge instructions? no   Where were you discharged to? Home.    Items Reviewed:  Medications reviewed: yes  Allergies reviewed: yes  Dietary changes reviewed: yes  Referrals reviewed: yes   Functional Questionnaire:   Activities of Daily Living (ADLs):   He states they are independent in the following: ambulation, bathing and hygiene, feeding, continence, grooming, toileting and dressing States they require assistance with the following: none. awaiting home health.   Any transportation issues/concerns?: no   Any patient concerns? Yes. Patient did not want to wait until Friday to see Dr Yong Channel. He did not feel comfortable changing his medication (per hospital DC instructions) until he reviewed it with Dr Yong Channel first.    Confirmed importance and date/time of follow-up visits scheduled yes  Provider Appointment booked with Dr Yong Channel 03/08/18 10:45.  Confirmed with patient if condition begins to worsen call PCP or go to the ER.  Patient was given the office number and encouraged to call back with question or concerns.  : yes

## 2018-03-07 NOTE — Telephone Encounter (Signed)
Noted  Thanks. Makes sense to see him promptly given his medication concerns.

## 2018-03-08 ENCOUNTER — Ambulatory Visit (INDEPENDENT_AMBULATORY_CARE_PROVIDER_SITE_OTHER): Payer: Medicare Other | Admitting: Family Medicine

## 2018-03-08 ENCOUNTER — Telehealth: Payer: Self-pay | Admitting: Family Medicine

## 2018-03-08 ENCOUNTER — Encounter: Payer: Self-pay | Admitting: Family Medicine

## 2018-03-08 DIAGNOSIS — J189 Pneumonia, unspecified organism: Secondary | ICD-10-CM | POA: Diagnosis not present

## 2018-03-08 DIAGNOSIS — I5032 Chronic diastolic (congestive) heart failure: Secondary | ICD-10-CM

## 2018-03-08 DIAGNOSIS — I4891 Unspecified atrial fibrillation: Secondary | ICD-10-CM | POA: Diagnosis not present

## 2018-03-08 DIAGNOSIS — Z86711 Personal history of pulmonary embolism: Secondary | ICD-10-CM | POA: Diagnosis not present

## 2018-03-08 DIAGNOSIS — Z905 Acquired absence of kidney: Secondary | ICD-10-CM | POA: Diagnosis not present

## 2018-03-08 DIAGNOSIS — M10079 Idiopathic gout, unspecified ankle and foot: Secondary | ICD-10-CM

## 2018-03-08 DIAGNOSIS — Z22322 Carrier or suspected carrier of Methicillin resistant Staphylococcus aureus: Secondary | ICD-10-CM | POA: Diagnosis not present

## 2018-03-08 DIAGNOSIS — M1990 Unspecified osteoarthritis, unspecified site: Secondary | ICD-10-CM | POA: Diagnosis not present

## 2018-03-08 DIAGNOSIS — N183 Chronic kidney disease, stage 3 unspecified: Secondary | ICD-10-CM

## 2018-03-08 DIAGNOSIS — R5381 Other malaise: Secondary | ICD-10-CM | POA: Diagnosis not present

## 2018-03-08 DIAGNOSIS — M109 Gout, unspecified: Secondary | ICD-10-CM | POA: Diagnosis not present

## 2018-03-08 DIAGNOSIS — I129 Hypertensive chronic kidney disease with stage 1 through stage 4 chronic kidney disease, or unspecified chronic kidney disease: Secondary | ICD-10-CM | POA: Diagnosis not present

## 2018-03-08 DIAGNOSIS — Z79891 Long term (current) use of opiate analgesic: Secondary | ICD-10-CM | POA: Diagnosis not present

## 2018-03-08 DIAGNOSIS — J4540 Moderate persistent asthma, uncomplicated: Secondary | ICD-10-CM

## 2018-03-08 DIAGNOSIS — G4733 Obstructive sleep apnea (adult) (pediatric): Secondary | ICD-10-CM | POA: Diagnosis not present

## 2018-03-08 DIAGNOSIS — I4892 Unspecified atrial flutter: Secondary | ICD-10-CM

## 2018-03-08 DIAGNOSIS — I2510 Atherosclerotic heart disease of native coronary artery without angina pectoris: Secondary | ICD-10-CM | POA: Diagnosis not present

## 2018-03-08 DIAGNOSIS — Z8619 Personal history of other infectious and parasitic diseases: Secondary | ICD-10-CM | POA: Diagnosis not present

## 2018-03-08 DIAGNOSIS — Z87891 Personal history of nicotine dependence: Secondary | ICD-10-CM | POA: Diagnosis not present

## 2018-03-08 DIAGNOSIS — Z7901 Long term (current) use of anticoagulants: Secondary | ICD-10-CM | POA: Diagnosis not present

## 2018-03-08 DIAGNOSIS — Z85528 Personal history of other malignant neoplasm of kidney: Secondary | ICD-10-CM | POA: Diagnosis not present

## 2018-03-08 DIAGNOSIS — J449 Chronic obstructive pulmonary disease, unspecified: Secondary | ICD-10-CM | POA: Diagnosis not present

## 2018-03-08 DIAGNOSIS — Z96653 Presence of artificial knee joint, bilateral: Secondary | ICD-10-CM | POA: Diagnosis not present

## 2018-03-08 DIAGNOSIS — G4700 Insomnia, unspecified: Secondary | ICD-10-CM

## 2018-03-08 MED ORDER — ALLOPURINOL 100 MG PO TABS: 100.0000 mg | ORAL_TABLET | Freq: Every day | ORAL | 6 refills | Status: DC

## 2018-03-08 MED ORDER — TRAMADOL HCL 50 MG PO TABS: 50.0000 mg | ORAL_TABLET | Freq: Four times a day (QID) | ORAL | 2 refills | Status: DC | PRN

## 2018-03-08 MED ORDER — APIXABAN 5 MG PO TABS: 5.0000 mg | ORAL_TABLET | Freq: Two times a day (BID) | ORAL | 11 refills | Status: DC

## 2018-03-08 NOTE — Telephone Encounter (Signed)
Copied from [place] [id number]. Topic: General - Other >> Mar 08, 2018  4:02 PM Keene Breath wrote: Reason for CRM: Sharyn Lull with [facility] called to give verbal orders for patient - Skilled nursing visits for 9 weeks.  CB# [phone].

## 2018-03-08 NOTE — Patient Instructions (Addendum)
6 week follow up to check on gout. Do high dose flu shot at that time.   Cut water intake to 40-50 oz or so a day instead of starting lasix- if you note more swelling, more shortness of breath, see [place] back ASAP. Leg elevation when not being active is reasonable. Would also consider x-ray if shortness of breath not improving once rehab starts back.   Weight yourself daily. If you gain 3 lbs in a day or 5 lbs in 3 days or edema worsens- we should probably restart lasix so get in touch with [place].   Start allopurinol 100mg . Continue colchicine twice a day for another week then start once a day for another 2 weeks then can stop- let me know if any gout flares. Right now you have some residual pain in joint- I think arthritis is contributing  Tramadol and nortriptyline together increase seizure risk some- and risk of serotonin syndrome- we need to know ASAP if any issues  Please stop by lab before you go  Serotonin Syndrome Serotonin is a brain chemical that regulates the nervous system, which includes the brain, spinal cord, and nerves. Serotonin appears to play a role in all types of behavior, including appetite, emotions, movement, thinking, and response to stress. Excessively high levels of serotonin in the body can cause serotonin syndrome, which is a very dangerous condition. What are the causes? This condition can be caused by taking medicines or drugs that increase the level of serotonin in your body. These include:  Antidepressant medicines.  Migraine medicines.  Certain pain medicines.  Certain recreational drugs, including ecstasy, LSD, cocaine, and amphetamines.  Over-the-counter cough or cold medicines that contain dextromethorphan.  Certain herbal supplements, including St. John's wort, ginseng, and nutmeg.  This condition usually occurs when you take these medicines or drugs in combination, but it can also happen with a high dose of a single medicine or drug. What increases the  risk? This condition is more likely to develop in:  People who have recently increased the dosage of medicine that increases the serotonin level.  People who just started taking medicine that increases the serotonin level.  What are the signs or symptoms? Symptoms of this condition usually happens within several hours of a medicine change. Symptoms include:  Headache.  Muscle twitching or stiffness.  Diarrhea.  Confusion.  Restlessness or agitation.  Shivering or goose bumps.  Loss of muscle coordination.  Rapid heart rate.  Sweating.  Severe cases of serotonin syndromecan cause:  Irregular heartbeat.  Seizures.  Loss of consciousness.  High fever.  How is this diagnosed? This condition is diagnosed with a medical history and physical exam. You will be asked aboutyour symptoms and your use of medicines and recreational drugs. Your health care provider may also order lab work or additional tests to rule out other causes of your symptoms. How is this treated? The treatment for this condition depends on the severity of your symptoms. For mild cases, stopping the medicine that caused your condition is usually all that is needed. For moderate to severe cases, hospitalization is required to monitor you and to prevent further muscle damage. Follow these instructions at home:  Take over-the-counter and prescription medicines only as told by your health care provider. This is important.  Check with your health care provider before you start taking any new prescriptions, over-the-counter medicines, herbs, or supplements.  Avoid combining any medicines that can cause this condition to occur.  Keep all follow-up visits as told  by your health care provider.This is important.  Maintain a healthy lifestyle. ? Eat healthy foods. ? Get plenty of sleep. ? Exercise regularly. ? Do not drink alcohol. ? Do not use recreational drugs. Contact a health care provider  if:  Medicines do not seem to be helping.  Your symptoms do not improve or they get worse.  You have trouble taking care of yourself. Get help right away if:  You have worsening confusion, severe headache, chest pain, high fever, seizures, or loss of consciousness.  You have serious thoughts about hurting yourself or others.  You experience serious side effects of medicine, such as swelling of your face, lips, tongue, or throat. This information is not intended to replace advice given to you by your health care provider. Make sure you discuss any questions you have with your health care provider. Document Released: 07/23/2004 Document Revised: 02/08/2016 Document Reviewed: 06/28/2014 Elsevier Interactive Patient Education  [employer].

## 2018-03-08 NOTE — Progress Notes (Signed)
Subjective:  Wayne Booth is a 77 y.o. year old very pleasant male patient who presents for transitional care management and hospital follow up for acute hypoxemic respiratory failure due to MRSA pneumonia. Patient was hospitalized from 02/02/18 to 02/26/18 and then in rehab from 02/26/18 to 03/05/18. A TCM phone call was completed on 03/07/18. Medical complexity high   Patient was originally went to ER on 02/02/18. He had recently had an ablation by Dr. Curt Booth but despite this noted progressive shortness of breath even with minimal exertion over 5 days prior to admission, this was accompanied by right sided pleuritic chest pain. He did not have cough, fever, chills. In ER though he was found to be febrile. Chest x-ray showed bilateral infiltrates. Had pericardial effusion and bedside echo performed by cardiology with no significant pericardial effusion. Patient started on antibiotics for pneumonia and sepsis from the pneumonia. He required BIPAP initially as was hypoxic. Unfortunately patient deteriorated and eventually required prolonged hospitalization and intubation (acute hypoxemic respiratory failure). Had septic shock and required vasopressors for a time.  pneumonia was deemed due to MRSA pneumonia (based off sputum culture)- he completed a course of antibiotics. Patient also had acute metabolic encephalopathy with delirium due to hypoxemia and sepsis.  He did not seem to be making much progress and decision was made to extubate- family was even told that patient would likely expire within a day of extubation but he had an amazing recovery. He was evaluated by CIR and due to profound weakness was discharged there instead of to home or to SNF- he made tremendous progress in rehab.   Patient has done reasonably well outside of the hospital- he does feel like with the gap in therapy over last few days that his weakness/shortness of breath have slightly worsened. He has some edema as well as continued mild  crackles (last x-rays show likely atelectasis)  During hospitalization patient also had an echocardiogram which showed preserved LV systolic function. His a ib was rate controlled even not on rate control medicine. He was continued on eliquis for anticoagulation. He had prior been on metoprolol and diltiazem before ablation but these were removed during hospitalization. He is also no longer on amiodarone.   Hospital lists COPD but Dr. Bari Booth last note of pulmonary states asthmatic bronchitis- patient was discharged on albuterol alone, but prior had been on Symbicort. He is on albuterol prn at this time.   Hypothyroidism was maintained on synthroid  Unilateral kidney- baseline creatinine around 1.2, peaked at 2.4 but trended back down to 1.37 before discharge.   Patient had his allopurinol and colchicine stopped but ended up with gout flare while in rehab and is now on BID colchicine. X-rays also show arthritis In bilateral great toes which could contribute to pain.   Prior to all of this insomnia treated with belsomra- he states he is sleeping fine without it at this point.   Imaging: I personally reviewed the following,   01/31/18 chest x-ray- some cardiac enlargement. Mild diffuse interstitial edema in the lungs. No obvious pneumonia.   02/03/18 chest ct without contrast- trace pericardial effusion, coronary atherosclerosis, aortic atherosclerosis, mediastinal lipomatosis, some lymphadenopathy in setting of acute illness, patchy bibasilar, right middle and right upper lobe opacities concerning for pneumonia. Fatty liver also noted   02/05/18 chest x-ray which shows bilateral airspace opacities concerning for multifocal pneumonia. Prior noted pericardial effusions no longer noted. No obvious pneumothorax  I did not personally review following imaging Nuclear medicine ventilation perfusion lung  scan- low probability for PE     See problem oriented charting as well ROS- some fatigue and mild  shortness ofbreath with activity- these were improving during therapy but now off for4 days. No chest pain. Some edema.    Past Medical History-  Patient Active Problem List   Diagnosis Date Noted  . (HFpEF) heart failure with preserved ejection fraction (Wayne Booth) 12/16/2017    Priority: High  . Chest pain     Priority: High  . Atrial flutter ([place]) 11/01/2017    Priority: High  . Shortness of breath 06/14/2017    Priority: High  . History of pulmonary embolism 10/23/2016    Priority: High  . Asthmatic bronchitis 06/09/2011    Priority: High  . 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 History of renal cell carcinoma 12/26/2007    Priority: High  . Chronic kidney disease (CKD), stage III (moderate) (HCC)     Priority: Medium  . HCAP (healthcare-associated pneumonia) 02/03/2018    Priority: Medium  . History of adenomatous polyp of colon 04/12/2017    Priority: Medium  . Cervical disc disease 11/13/2015    Priority: Medium  . Essential tremor 08/14/2015    Priority: Medium  . Gout 08/14/2015    Priority: Medium  . Hypothyroidism 07/12/2014    Priority: Medium  . Hyperglycemia 07/12/2014    Priority: Medium  . GAD (generalized anxiety disorder) 11/07/2013    Priority: Medium  . OSA (obstructive sleep apnea) 07/07/2011    Priority: Medium  . Morbid obesity ([place]) 10/02/2008    Priority: Medium  . Essential hypertension 12/28/2006    Priority: Medium  . Constipation 01/21/2018    Priority: Low  . Anticoagulated     Priority: Low  . Bilateral foot pain 07/13/2017    Priority: Low  . Community acquired pneumonia 06/18/2017    Priority: Low  . Actinic keratosis 11/27/2009    Priority: Low  . Osteoarthritis 11/13/2008    Priority: Low  . TESTOSTERONE DEFICIENCY 12/28/2006    Priority: Low  . Labile blood pressure   . Steroid-induced hyperglycemia   . Debility 02/26/2018  . Insomnia 12/26/2007    Medications- reviewed and updated  A medical  reconciliation was performed comparing current medicines to hospital discharge medications. Current Outpatient Medications  Medication Sig Dispense Refill  . albuterol (PROVENTIL HFA;VENTOLIN HFA) 108 (90 Base) MCG/ACT inhaler Inhale 2 puffs into the lungs every 6 (six) hours as needed for wheezing or shortness of breath. 1 Inhaler 5  . colchicine 0.6 MG tablet Take 1 tablet (0.6 mg total) by mouth 2 (two) times daily. 60 tablet 0  . cyclobenzaprine (FLEXERIL) 10 MG tablet Take 1 tablet (10 mg total) by mouth 3 (three) times daily as needed for muscle spasms. 30 tablet 0  . docusate sodium (COLACE) 100 MG capsule Take 1 capsule (100 mg total) by mouth daily. 10 capsule 0  . escitalopram (LEXAPRO) 20 MG tablet Take 1 tablet (20 mg total) by mouth daily. 90 tablet 3  . levothyroxine (SYNTHROID, LEVOTHROID) 150 MCG tablet Take 1 tablet (150 mcg total) by mouth daily. 90 tablet 2  . Multiple Vitamins-Minerals (MULTIVITAMINS THER. W/MINERALS) TABS Take 1 tablet by mouth daily.    . nortriptyline (PAMELOR) 75 MG capsule Take 1 capsule (75 mg total) by mouth at bedtime. 90 capsule 1  . polyethylene glycol (MIRALAX / GLYCOLAX) packet Take 17 g by mouth daily. 14 each 0  . allopurinol (ZYLOPRIM) 100 MG tablet Take  1 tablet (100 mg total) by mouth daily. 30 tablet 6  . apixaban (ELIQUIS) 5 MG TABS tablet Take 1 tablet (5 mg total) by mouth 2 (two) times daily. 60 tablet 11  . budesonide-formoterol (SYMBICORT) 160-4.5 MCG/ACT inhaler Inhale 2 puffs into the lungs 2 (two) times daily. 1 Inhaler 5  . traMADol (ULTRAM) 50 MG tablet Take 1 tablet (50 mg total) by mouth every 6 (six) hours as needed for moderate pain. 30 tablet 2   No current facility-administered medications for this visit.     Objective: BP 118/74 (BP Location: Left Arm, Patient Position: Sitting, Cuff Size: Large)   Pulse 70   Temp 97.8 F (36.6 C) (Oral)   Ht 5\' 10"  (1.778 m)   Wt 271 lb 3.2 oz (123 kg)   SpO2 99%   BMI 38.91 kg/m   Gen: NAD, resting comfortably CV: RRR no murmurs rubs or gallops Lungs: CTAB no  wheeze, rhonchi. Faint crackles at bases Abdomen: soft/nontender/nondistended/normal bowel sounds.  Ext: 1+ edema Skin: warm, dry Neuro: uses hands to assist himself from standing from chair- able to get onto table without assist.   Assessment/Plan:  Other notes: 1.Cbc, CMP, uric acid in 6 weeks - he declines labs for now 2. Flutter valve at home, incentive spirometer- encouraged continued use  HCAP (healthcare-associated pneumonia) Hypoxemic respiratory failure associated with this as well- Has resolved after intubation, antibiotics. This was aided by pulmonary critical care as well as ID excellent care in hospital.   Debility Patient will continue with home health at this point. He is thrilled with progress from CIR. He is feeling some fatigue and shortness of breath but drinking excess fluids and off lasix so he will pull that back. Also off symbicort prior rxed through pulm. I am going to send that in for him to restart.   Chronic kidney disease (CKD), stage III (moderate) (HCC) AKI in hospital- Creatinine reached up to 2.4 but trended back down near baseline of around 1.2 before discharge to about 1.4- discussed repeating labs but he wants to hold off for now until 6 week follow up when we will check uric acid as well   (HFpEF) heart failure with preserved ejection fraction ([place]) Holding for now- Lasix 20mg . I had discussed restarting this with edema and mild SOB but wife informs me patient easily drinking 60-70 oz a day - they willr educe this by at least 20 oz per day and if continues to have issues can cut further and/or restart lasix   Preserved EF during august 2019 hospitalization.   Atrial flutter ([place]) He remains on eliquis but after ablation last month on august 2nd he has been off amiodarone, metoprolol, diltiazem.   Asthmatic bronchitis Per Dr. Bari Booth notes- this is the diagnosis. We  will reach out ot him to restart symbicort and can continue albuterol prn. Luckily he has pulmonary follow up next week.   Gout I dont see obvious active gout flare- arthritis could contribute to pain in toes. Restart allopruinol 100mg . Colchicine per avs as we put allopurinol back in place. Uric acid in 6 weeks. Could consider prednisone if needed as well. Tramadol really helps him when bad so I refilled this though we discussed serotonin syndrome risks. Warm water and vinegar soaks helping about the most.   Insomnia Off belsomra now- sleeping well after getting home from hospital so will keep meds off. Had concisered rozerem but was costly   Future Appointments  Date Time Provider [place]  03/16/2018 11:40 AM Jamse Arn, MD [facility]  04/04/2018  9:20 AM Jettie Booze, MD [facility]  04/11/2018  8:40 AM Troy Sine, MD [facility] [place]  04/19/2018 10:00 AM Marin Olp, MD [facility]  05/03/2018 11:15 AM Wayne Booth, Ocie Doyne, MD [facility]    Lab/Order associations: HCAP (healthcare-associated pneumonia)  Chronic heart failure with preserved ejection fraction (HCC)  Debility  Chronic kidney disease (CKD), stage III (moderate) (HCC)  Atrial flutter, unspecified type (HCC)  Moderate persistent asthmatic bronchitis without complication  Acute idiopathic gout of foot, unspecified laterality  Insomnia, unspecified type  Meds ordered this encounter  Medications  . apixaban (ELIQUIS) 5 MG TABS tablet    Sig: Take 1 tablet (5 mg total) by mouth 2 (two) times daily.    Dispense:  60 tablet    Refill:  11  . traMADol (ULTRAM) 50 MG tablet    Sig: Take 1 tablet (50 mg total) by mouth every 6 (six) hours as needed for moderate pain.    Dispense:  30 tablet    Refill:  2  . allopurinol (ZYLOPRIM) 100 MG tablet    Sig: Take 1 tablet (100 mg total) by mouth daily.    Dispense:  30 tablet    Refill:  6  .  budesonide-formoterol (SYMBICORT) 160-4.5 MCG/ACT inhaler    Sig: Inhale 2 puffs into the lungs 2 (two) times daily.    Dispense:  1 Inhaler    Refill:  5    Order Specific Question:   Lot Number?    Answer:   [id number]    Order Specific Question:   Expiration Date?    Answer:   06/28/2028    Order Specific Question:   Manufacturer?    Answer:   [employer] [[id number]]    Order Specific Question:   Quantity    Answer:   1    Return precautions advised.  [name], MD

## 2018-03-09 DIAGNOSIS — M109 Gout, unspecified: Secondary | ICD-10-CM | POA: Diagnosis not present

## 2018-03-09 DIAGNOSIS — G4733 Obstructive sleep apnea (adult) (pediatric): Secondary | ICD-10-CM | POA: Diagnosis not present

## 2018-03-09 DIAGNOSIS — Z8619 Personal history of other infectious and parasitic diseases: Secondary | ICD-10-CM | POA: Diagnosis not present

## 2018-03-09 DIAGNOSIS — Z79891 Long term (current) use of opiate analgesic: Secondary | ICD-10-CM | POA: Diagnosis not present

## 2018-03-09 DIAGNOSIS — I2510 Atherosclerotic heart disease of native coronary artery without angina pectoris: Secondary | ICD-10-CM | POA: Diagnosis not present

## 2018-03-09 DIAGNOSIS — I129 Hypertensive chronic kidney disease with stage 1 through stage 4 chronic kidney disease, or unspecified chronic kidney disease: Secondary | ICD-10-CM | POA: Diagnosis not present

## 2018-03-09 DIAGNOSIS — Z7901 Long term (current) use of anticoagulants: Secondary | ICD-10-CM | POA: Diagnosis not present

## 2018-03-09 DIAGNOSIS — Z86711 Personal history of pulmonary embolism: Secondary | ICD-10-CM | POA: Diagnosis not present

## 2018-03-09 DIAGNOSIS — Z85528 Personal history of other malignant neoplasm of kidney: Secondary | ICD-10-CM | POA: Diagnosis not present

## 2018-03-09 DIAGNOSIS — J449 Chronic obstructive pulmonary disease, unspecified: Secondary | ICD-10-CM | POA: Diagnosis not present

## 2018-03-09 DIAGNOSIS — Z87891 Personal history of nicotine dependence: Secondary | ICD-10-CM | POA: Diagnosis not present

## 2018-03-09 DIAGNOSIS — Z96653 Presence of artificial knee joint, bilateral: Secondary | ICD-10-CM | POA: Diagnosis not present

## 2018-03-09 DIAGNOSIS — Z22322 Carrier or suspected carrier of Methicillin resistant Staphylococcus aureus: Secondary | ICD-10-CM | POA: Diagnosis not present

## 2018-03-09 DIAGNOSIS — I4891 Unspecified atrial fibrillation: Secondary | ICD-10-CM | POA: Diagnosis not present

## 2018-03-09 DIAGNOSIS — Z905 Acquired absence of kidney: Secondary | ICD-10-CM | POA: Diagnosis not present

## 2018-03-09 DIAGNOSIS — M1990 Unspecified osteoarthritis, unspecified site: Secondary | ICD-10-CM | POA: Diagnosis not present

## 2018-03-09 DIAGNOSIS — N183 Chronic kidney disease, stage 3 (moderate): Secondary | ICD-10-CM | POA: Diagnosis not present

## 2018-03-09 MED ORDER — BUDESONIDE-FORMOTEROL FUMARATE 160-4.5 MCG/ACT IN AERO: 2.0000 | INHALATION_SPRAY | Freq: Two times a day (BID) | RESPIRATORY_TRACT | 5 refills | Status: DC

## 2018-03-09 NOTE — Assessment & Plan Note (Signed)
Hypoxemic respiratory failure associated with this as well- Has resolved after intubation, antibiotics. This was aided by pulmonary critical care as well as ID excellent care in hospital.

## 2018-03-09 NOTE — Assessment & Plan Note (Signed)
Holding for now- Lasix 20mg . I had discussed restarting this with edema and mild SOB but wife informs me patient easily drinking 60-70 oz a day - they willr educe this by at least 20 oz per day and if continues to have issues can cut further and/or restart lasix   Preserved EF during august 2019 hospitalization.

## 2018-03-09 NOTE — Telephone Encounter (Signed)
Called and left a message providing verbal orders as requested

## 2018-03-09 NOTE — Assessment & Plan Note (Addendum)
Per Dr. Bari Mantis notes- this is the diagnosis. We will reach out ot him to restart symbicort and can continue albuterol prn. Luckily he has pulmonary follow up next week.

## 2018-03-09 NOTE — Assessment & Plan Note (Signed)
Off belsomra now- sleeping well after getting home from hospital so will keep meds off. Had concisered rozerem but was costly

## 2018-03-09 NOTE — Assessment & Plan Note (Signed)
Patient will continue with home health at this point. He is thrilled with progress from CIR. He is feeling some fatigue and shortness of breath but drinking excess fluids and off lasix so he will pull that back. Also off symbicort prior rxed through pulm. I am going to send that in for him to restart.

## 2018-03-09 NOTE — Assessment & Plan Note (Addendum)
AKI in hospital- Creatinine reached up to 2.4 but trended back down near baseline of around 1.2 before discharge to about 1.4- discussed repeating labs but he wants to hold off for now until 6 week follow up when we will check uric acid as well

## 2018-03-09 NOTE — Assessment & Plan Note (Signed)
I dont see obvious active gout flare- arthritis could contribute to pain in toes. Restart allopruinol 100mg . Colchicine per avs as we put allopurinol back in place. Uric acid in 6 weeks. Could consider prednisone if needed as well. Tramadol really helps him when bad so I refilled this though we discussed serotonin syndrome risks. Warm water and vinegar soaks helping about the most.

## 2018-03-09 NOTE — Assessment & Plan Note (Signed)
He remains on eliquis but after ablation last month on august 2nd he has been off amiodarone, metoprolol, diltiazem.

## 2018-03-11 ENCOUNTER — Telehealth: Payer: Self-pay

## 2018-03-11 ENCOUNTER — Inpatient Hospital Stay: Payer: Medicare Other | Admitting: Family Medicine

## 2018-03-11 NOTE — Telephone Encounter (Signed)
Wayne Booth [facility] called today requesting verbal orders for physical therapy for functional mobility and falls prevention for 2xwk X 3wks.  Called him back and approved verbal orders.

## 2018-03-14 DIAGNOSIS — Z22322 Carrier or suspected carrier of Methicillin resistant Staphylococcus aureus: Secondary | ICD-10-CM | POA: Diagnosis not present

## 2018-03-14 DIAGNOSIS — M109 Gout, unspecified: Secondary | ICD-10-CM | POA: Diagnosis not present

## 2018-03-14 DIAGNOSIS — I4891 Unspecified atrial fibrillation: Secondary | ICD-10-CM | POA: Diagnosis not present

## 2018-03-14 DIAGNOSIS — Z96653 Presence of artificial knee joint, bilateral: Secondary | ICD-10-CM | POA: Diagnosis not present

## 2018-03-14 DIAGNOSIS — Z79891 Long term (current) use of opiate analgesic: Secondary | ICD-10-CM | POA: Diagnosis not present

## 2018-03-14 DIAGNOSIS — Z86711 Personal history of pulmonary embolism: Secondary | ICD-10-CM | POA: Diagnosis not present

## 2018-03-14 DIAGNOSIS — N183 Chronic kidney disease, stage 3 (moderate): Secondary | ICD-10-CM | POA: Diagnosis not present

## 2018-03-14 DIAGNOSIS — Z7901 Long term (current) use of anticoagulants: Secondary | ICD-10-CM | POA: Diagnosis not present

## 2018-03-14 DIAGNOSIS — I129 Hypertensive chronic kidney disease with stage 1 through stage 4 chronic kidney disease, or unspecified chronic kidney disease: Secondary | ICD-10-CM | POA: Diagnosis not present

## 2018-03-14 DIAGNOSIS — Z905 Acquired absence of kidney: Secondary | ICD-10-CM | POA: Diagnosis not present

## 2018-03-14 DIAGNOSIS — M1990 Unspecified osteoarthritis, unspecified site: Secondary | ICD-10-CM | POA: Diagnosis not present

## 2018-03-14 DIAGNOSIS — Z85528 Personal history of other malignant neoplasm of kidney: Secondary | ICD-10-CM | POA: Diagnosis not present

## 2018-03-14 DIAGNOSIS — Z87891 Personal history of nicotine dependence: Secondary | ICD-10-CM | POA: Diagnosis not present

## 2018-03-14 DIAGNOSIS — I2510 Atherosclerotic heart disease of native coronary artery without angina pectoris: Secondary | ICD-10-CM | POA: Diagnosis not present

## 2018-03-14 DIAGNOSIS — Z8619 Personal history of other infectious and parasitic diseases: Secondary | ICD-10-CM | POA: Diagnosis not present

## 2018-03-14 DIAGNOSIS — G4733 Obstructive sleep apnea (adult) (pediatric): Secondary | ICD-10-CM | POA: Diagnosis not present

## 2018-03-14 DIAGNOSIS — J449 Chronic obstructive pulmonary disease, unspecified: Secondary | ICD-10-CM | POA: Diagnosis not present

## 2018-03-16 ENCOUNTER — Encounter: Payer: Self-pay | Admitting: Physical Medicine & Rehabilitation

## 2018-03-16 ENCOUNTER — Other Ambulatory Visit: Payer: Self-pay

## 2018-03-16 ENCOUNTER — Encounter: Payer: Medicare Other | Attending: Physical Medicine & Rehabilitation | Admitting: Physical Medicine & Rehabilitation

## 2018-03-16 VITALS — BP 117/79 | HR 94 | Ht 71.0 in | Wt 273.0 lb

## 2018-03-16 DIAGNOSIS — F419 Anxiety disorder, unspecified: Secondary | ICD-10-CM | POA: Diagnosis not present

## 2018-03-16 DIAGNOSIS — I4891 Unspecified atrial fibrillation: Secondary | ICD-10-CM | POA: Diagnosis not present

## 2018-03-16 DIAGNOSIS — Z8739 Personal history of other diseases of the musculoskeletal system and connective tissue: Secondary | ICD-10-CM | POA: Diagnosis not present

## 2018-03-16 DIAGNOSIS — Z85528 Personal history of other malignant neoplasm of kidney: Secondary | ICD-10-CM | POA: Insufficient documentation

## 2018-03-16 DIAGNOSIS — Z9889 Other specified postprocedural states: Secondary | ICD-10-CM | POA: Insufficient documentation

## 2018-03-16 DIAGNOSIS — R269 Unspecified abnormalities of gait and mobility: Secondary | ICD-10-CM | POA: Diagnosis not present

## 2018-03-16 DIAGNOSIS — N183 Chronic kidney disease, stage 3 unspecified: Secondary | ICD-10-CM

## 2018-03-16 DIAGNOSIS — Z8701 Personal history of pneumonia (recurrent): Secondary | ICD-10-CM | POA: Insufficient documentation

## 2018-03-16 DIAGNOSIS — Z86711 Personal history of pulmonary embolism: Secondary | ICD-10-CM

## 2018-03-16 DIAGNOSIS — I2510 Atherosclerotic heart disease of native coronary artery without angina pectoris: Secondary | ICD-10-CM | POA: Diagnosis not present

## 2018-03-16 DIAGNOSIS — R5381 Other malaise: Secondary | ICD-10-CM | POA: Diagnosis not present

## 2018-03-16 DIAGNOSIS — Z96653 Presence of artificial knee joint, bilateral: Secondary | ICD-10-CM

## 2018-03-16 DIAGNOSIS — M109 Gout, unspecified: Secondary | ICD-10-CM | POA: Diagnosis not present

## 2018-03-16 DIAGNOSIS — Z8546 Personal history of malignant neoplasm of prostate: Secondary | ICD-10-CM | POA: Insufficient documentation

## 2018-03-16 DIAGNOSIS — Z6838 Body mass index (BMI) 38.0-38.9, adult: Secondary | ICD-10-CM | POA: Diagnosis not present

## 2018-03-16 DIAGNOSIS — Z905 Acquired absence of kidney: Secondary | ICD-10-CM | POA: Insufficient documentation

## 2018-03-16 DIAGNOSIS — J449 Chronic obstructive pulmonary disease, unspecified: Secondary | ICD-10-CM | POA: Insufficient documentation

## 2018-03-16 DIAGNOSIS — Z79891 Long term (current) use of opiate analgesic: Secondary | ICD-10-CM

## 2018-03-16 DIAGNOSIS — Z22322 Carrier or suspected carrier of Methicillin resistant Staphylococcus aureus: Secondary | ICD-10-CM

## 2018-03-16 DIAGNOSIS — E669 Obesity, unspecified: Secondary | ICD-10-CM | POA: Diagnosis not present

## 2018-03-16 DIAGNOSIS — G4733 Obstructive sleep apnea (adult) (pediatric): Secondary | ICD-10-CM | POA: Diagnosis not present

## 2018-03-16 DIAGNOSIS — M1990 Unspecified osteoarthritis, unspecified site: Secondary | ICD-10-CM

## 2018-03-16 DIAGNOSIS — Z7901 Long term (current) use of anticoagulants: Secondary | ICD-10-CM | POA: Insufficient documentation

## 2018-03-16 DIAGNOSIS — Z87891 Personal history of nicotine dependence: Secondary | ICD-10-CM

## 2018-03-16 DIAGNOSIS — I129 Hypertensive chronic kidney disease with stage 1 through stage 4 chronic kidney disease, or unspecified chronic kidney disease: Secondary | ICD-10-CM | POA: Diagnosis not present

## 2018-03-16 DIAGNOSIS — J9690 Respiratory failure, unspecified, unspecified whether with hypoxia or hypercapnia: Secondary | ICD-10-CM | POA: Diagnosis not present

## 2018-03-16 DIAGNOSIS — E039 Hypothyroidism, unspecified: Secondary | ICD-10-CM | POA: Insufficient documentation

## 2018-03-16 DIAGNOSIS — Z8619 Personal history of other infectious and parasitic diseases: Secondary | ICD-10-CM

## 2018-03-16 NOTE — Progress Notes (Signed)
Subjective:    Patient ID: Wayne Booth, male    DOB: 1940/08/27, 77 y.o.   MRN: [id number]  HPI 77 year old right-handed male with history of atrial fibrillation status post ablation, maintained on Eliquis, sleep apnea, COPD, CKD stage III, presents for hospital care management.   DATE OF DISCHARGE: 02/26/2018 DATE OF DISCHARGE: 03/05/2018  Wife supplements history. At discharge he was instructed to follow up with his PCP, which he did.  He has an appointment to see Cards.Continues to take Eliquis.  Denies pain. Mood has improved. WBCs returned to normal.  Gout has improved. Denies falls.  Therapies: 2/week Mobility: Seldom walker, otherwise unassissted DME: Bedside commode  Pain Inventory Average Pain 0 Pain Right Now 0 My pain is no pain  In the last 24 hours, has pain interfered with the following? General activity 5 Relation with others 0 Enjoyment of life 0 What TIME of day is your pain at its worst? daytime Sleep (in general) Good  Pain is worse with: walking, standing and some activites Pain improves with: none Relief from Meds: no meds  Mobility walk without assistance use a walker how many minutes can you walk? 15 ability to climb steps?  no do you drive?  no Do you have any goals in this area?  yes  Function retired  Neuro/Psych No problems in this area  Prior Studies x-rays CT/MRI  Physicians involved in your care Primary care Dr. Yong Channel   Family History  Problem Relation Age of Onset  . Cancer Father        [place], died before patient born  . Cancer Mother        stomach, died when he was young  . Kidney cancer Sister   . Colon cancer Brother 38  . Esophageal cancer Neg Hx   . Rectal cancer Neg Hx   . Stomach cancer Neg Hx    Social History   Socioeconomic History  . Marital status: Married    Spouse name: [name]  . Number of children: Y  . Years of education: Not on file  . Highest education level: Not on file  Occupational  History  . Occupation: retired Armed forces operational officer: RETIRED  Social Needs  . Financial resource strain: Not on file  . Food insecurity:    Worry: Not on file    Inability: Not on file  . Transportation needs:    Medical: Not on file    Non-medical: Not on file  Tobacco Use  . Smoking status: Former Smoker    Years: 4.00    Types: Cigars  . Smokeless tobacco: Never Used  . Tobacco comment: QUIT 2 YEARS AGO   2017  Substance and Sexual Activity  . Alcohol use: Yes    Alcohol/week: 2.0 standard drinks    Types: 2 Shots of liquor per week    Comment: socially  . Drug use: No  . Sexual activity: Not on file  Lifestyle  . Physical activity:    Days per week: Not on file    Minutes per session: Not on file  . Stress: Not on file  Relationships  . Social connections:    Talks on phone: Not on file    Gets together: Not on file    Attends religious service: Not on file    Active member of club or organization: Not on file    Attends meetings of clubs or organizations: Not on file    Relationship status: Not  on file  Other Topics Concern  . Not on file  Social History Narrative   Married 1965 (wife patient of Dr. Megan Salon). 2 sons. 4 granddaughters.       Retired Chemical engineer.       Hobbies: golf, fishing, formerly hunting.    Past Surgical History:  Procedure Laterality Date  . ABLATION OF DYSRHYTHMIC FOCUS  01/28/2018  . ANTERIOR CERVICAL DECOMP/DISCECTOMY FUSION N/A 08/29/2012   Procedure: ANTERIOR CERVICAL DECOMPRESSION/DISCECTOMY FUSION 1 LEVEL;  Surgeon: Eustace Moore, MD;  Location: [place] [facility];  Service: Neurosurgery;  Laterality: N/A;  . APPENDECTOMY    . ATRIAL FIBRILLATION ABLATION N/A 01/28/2018   Procedure: ATRIAL FIBRILLATION ABLATION;  Surgeon: Constance Haw, MD;  Location: [place] CV LAB;  Service: Cardiovascular;  Laterality: N/A;  . CARDIAC CATHETERIZATION  2008   clean  . CHOLECYSTECTOMY    . COLONOSCOPY W/  POLYPECTOMY    . CRANIOTOMY N/A 11/08/2012   Procedure: CRANIOTOMY HYPOPHYSECTOMY TRANSNASAL APPROACH;  Surgeon: Faythe Ghee, MD;  Location: [place] [facility];  Service: Neurosurgery;  Laterality: N/A;  Transphenoidal Resection of Pituitary Tumor  . FINGER SURGERY Left 2017   Dr Amedeo Plenty  . INSERTION PROSTATE RADIATION SEED    . KIDNEY STONE SURGERY    . KNEE ARTHROSCOPY  2007   left  . NEPHRECTOMY Left   . POSTERIOR CERVICAL LAMINECTOMY Left 04/24/2015   Procedure: Foraminotomy cervical five - cervical six cervical six - cervical seven left;  Surgeon: Eustace Moore, MD;  Location: [facility];  Service: Neurosurgery;  Laterality: Left;  . REPLACEMENT TOTAL KNEE BILATERAL     Past Medical History:  Diagnosis Date  . Allergy    States his nose runs when he is around grease.   Marland Kitchen Anxiety   . Arthritis   . Atrial fibrillation ([place])   . Cancer ([place])   . Coronary artery disease   . History of total knee replacement   . Hypertension   . Hypothyroidism   . Kidney cancer, primary, with metastasis from kidney to other site [facility])   . Kidney stone   . OSA (obstructive sleep apnea)    pt does not wear cpap at night  . Pituitary cyst ([place])   . Pneumonia    hx of  . Prostate cancer (HCC)    BP 117/79   Pulse 94   Ht 5\' 11"  (1.803 m)   Wt 273 lb (123.8 kg)   SpO2 93%   BMI 38.08 kg/m   Opioid Risk Score:   Fall Risk Score:  `1  Depression screen PHQ 2/9  Depression screen [facility] 2/9 03/16/2018 12/16/2017 04/26/2017 11/27/2016 11/13/2015 08/13/2015 07/12/2014  Decreased Interest 0 0 0 0 0 0 0  Down, Depressed, Hopeless 0 1 0 0 0 0 0  PHQ - 2 Score 0 1 0 0 0 0 0  Altered sleeping - 0 0 - - - -  Tired, decreased energy - 1 1 - - - -  Change in appetite - 3 2 - - - -  Feeling bad or failure about yourself  - 0 0 - - - -  Trouble concentrating - 0 0 - - - -  Moving slowly or fidgety/restless - 0 0 - - - -  Suicidal thoughts - 0 0 - - - -  PHQ-9 Score - 5 3 - - - -  Difficult doing  work/chores - Somewhat difficult Not difficult at all - - - -  Some recent  data might be hidden    Review of Systems  Constitutional: Negative.   HENT: Positive for ear pain.   Eyes: Negative.   Respiratory: Negative.   Cardiovascular: Negative.   Gastrointestinal: Negative.   Endocrine: Negative.   Genitourinary: Negative.   Musculoskeletal: Positive for gait problem and joint swelling.  Skin: Negative.   Allergic/Immunologic: Negative.   Psychiatric/Behavioral: Negative.   All other systems reviewed and are negative.     Objective:   Physical Exam Constitutional: No distress . Vital signs reviewed. HENT: Normocephalic.  Atraumatic. Eyes: EOMI. No discharge. Cardiovascular: Irregularly irregular. No JVD. Respiratory: CTA bilaterally. Normal effort. GI: BS +. Non-distended. Musculoskeletal: LE edema. No tenderness in extremities Neurological: He is alert and oriented Motor: B/l UE 4/5 B/l LE: 4/5 HF, KE and 4/5 ADF/PFimproving  Skin: Warm and dry. Intact. Psychiatric: normal mood. Normal behavior.    Assessment & Plan:  77 year old right-handed male with history of atrial fibrillation status post ablation, maintained on Eliquis, sleep apnea, COPD, CKD stage III, presents for hospital care management.   1. Debility secondary to pneumonia/respiratory failure.              Cont therapies  2. Mood  Cont meds  3. Atrial fibrillation status post ablation.   Cont meds  4. CKD stage III.   Follow up with Nephro  5. Obesity. BMI 38.75.   Encouraged weight loss  6. Gout  Cont meds  7. OSA  Encouraged compliance with CPAP  Meds reviewed Referrals reviewed All questions answered

## 2018-03-17 ENCOUNTER — Encounter: Payer: Self-pay | Admitting: Family Medicine

## 2018-03-17 ENCOUNTER — Ambulatory Visit: Payer: Medicare Other | Admitting: Interventional Cardiology

## 2018-03-17 ENCOUNTER — Ambulatory Visit (INDEPENDENT_AMBULATORY_CARE_PROVIDER_SITE_OTHER): Payer: Medicare Other | Admitting: Family Medicine

## 2018-03-17 VITALS — BP 126/92 | HR 93 | Temp 97.7°F | Ht 71.0 in | Wt 275.8 lb

## 2018-03-17 DIAGNOSIS — I10 Essential (primary) hypertension: Secondary | ICD-10-CM

## 2018-03-17 DIAGNOSIS — G250 Essential tremor: Secondary | ICD-10-CM | POA: Diagnosis not present

## 2018-03-17 DIAGNOSIS — I5032 Chronic diastolic (congestive) heart failure: Secondary | ICD-10-CM

## 2018-03-17 MED ORDER — PROPRANOLOL HCL 10 MG PO TABS: ORAL_TABLET | ORAL | 3 refills | Status: DC

## 2018-03-17 MED ORDER — FUROSEMIDE 20 MG PO TABS: 20.0000 mg | ORAL_TABLET | Freq: Every day | ORAL | 3 refills | Status: DC | PRN

## 2018-03-17 NOTE — Assessment & Plan Note (Signed)
S: controlled poorly off all rx today- with mild diastolic eelvation BP Readings from Last 3 Encounters:  03/17/18 (!) 126/92  03/16/18 117/79  03/08/18 118/74  A/P: We discussed blood pressure goal of <140/90. Continue current meds:  Will add back in propranolol primarily for essential tremor. Before starting this to do 3 days of lasix- may add lasix back in on top of propranolol in future for fluid excess

## 2018-03-17 NOTE — Assessment & Plan Note (Signed)
S: tremors have been really bad since getting out of the hospital. He is not sure why A/P: we reviewed that patient use to be on propranolol and later changed to metoprolol by cardiology for a fib. Since ablation and remaining out of a fib- we discussed could trial propranolol back at low dose 10mg  BID

## 2018-03-17 NOTE — Patient Instructions (Addendum)
Health Maintenance Due  Topic Date Due  . INFLUENZA VACCINE  01/27/2018    Would still advise cutting down on fluids but also for next 3 days want you to try lasix 20mg  in the morning. Lets see if weight goes down and shortness of breath improves. May hold if your lightheadedness gets worse with standing.   After the 3 days- take only if swelling worsens, weight goes up 2-3 lbs in a day or 3-5 lbs in 3 days. Can use in 1-3 day bursts as needed.   After the 3 days- on day 4, you can try the propranolol twice a day to see if that helps with tremor  See [place] back for new or worsening symptoms- particularly shortness of breath or fever

## 2018-03-17 NOTE — Assessment & Plan Note (Signed)
S: drinking a lot of perhaps over hydrated- didn't cut down since last visit as planned. Still having some orthostatic symptoms in last week or two.  Shortness of breath has worsened.  A/P: weight is up 4 lbs in about a week, edema worse, shortness of breath not improved- we opted to trial 3 days of lasix and follow up if not improving. If improves can repeat prn in future- see avs instructions

## 2018-03-17 NOTE — Progress Notes (Signed)
Subjective:  Wayne Booth is a 77 y.o. year old very pleasant male patient who presents for/with See problem oriented charting ROS- no chest pain. Does have shortness of breath. Some weight gain and increased edema.    Past Medical History-  Patient Active Problem List   Diagnosis Date Noted  . (HFpEF) heart failure with preserved ejection fraction ([place]) 12/16/2017    Priority: High  . Chest pain     Priority: High  . Atrial flutter ([place]) 11/01/2017    Priority: High  . Shortness of breath 06/14/2017    Priority: High  . History of pulmonary embolism 10/23/2016    Priority: High  . Asthmatic bronchitis 06/09/2011    Priority: High  . 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 History of renal cell carcinoma 12/26/2007    Priority: High  . Chronic kidney disease (CKD), stage III (moderate) (HCC)     Priority: Medium  . HCAP (healthcare-associated pneumonia) 02/03/2018    Priority: Medium  . History of adenomatous polyp of colon 04/12/2017    Priority: Medium  . Cervical disc disease 11/13/2015    Priority: Medium  . Essential tremor 08/14/2015    Priority: Medium  . Gout 08/14/2015    Priority: Medium  . Hypothyroidism 07/12/2014    Priority: Medium  . Hyperglycemia 07/12/2014    Priority: Medium  . GAD (generalized anxiety disorder) 11/07/2013    Priority: Medium  . OSA (obstructive sleep apnea) 07/07/2011    Priority: Medium  . Morbid obesity ([place]) 10/02/2008    Priority: Medium  . Essential hypertension 12/28/2006    Priority: Medium  . Constipation 01/21/2018    Priority: Low  . Anticoagulated     Priority: Low  . Bilateral foot pain 07/13/2017    Priority: Low  . Community acquired pneumonia 06/18/2017    Priority: Low  . Actinic keratosis 11/27/2009    Priority: Low  . Osteoarthritis 11/13/2008    Priority: Low  . TESTOSTERONE DEFICIENCY 12/28/2006    Priority: Low  . Labile blood pressure   . Steroid-induced hyperglycemia   .  Debility 02/26/2018  . Insomnia 12/26/2007   Medications- reviewed and updated Current Outpatient Medications  Medication Sig Dispense Refill  . albuterol (PROVENTIL HFA;VENTOLIN HFA) 108 (90 Base) MCG/ACT inhaler Inhale 2 puffs into the lungs every 6 (six) hours as needed for wheezing or shortness of breath. 1 Inhaler 5  . allopurinol (ZYLOPRIM) 100 MG tablet Take 1 tablet (100 mg total) by mouth daily. 30 tablet 6  . apixaban (ELIQUIS) 5 MG TABS tablet Take 1 tablet (5 mg total) by mouth 2 (two) times daily. 60 tablet 11  . budesonide-formoterol (SYMBICORT) 160-4.5 MCG/ACT inhaler Inhale 2 puffs into the lungs 2 (two) times daily. 1 Inhaler 5  . colchicine 0.6 MG tablet Take 1 tablet (0.6 mg total) by mouth 2 (two) times daily. 60 tablet 0  . cyclobenzaprine (FLEXERIL) 10 MG tablet Take 1 tablet (10 mg total) by mouth 3 (three) times daily as needed for muscle spasms. 30 tablet 0  . docusate sodium (COLACE) 100 MG capsule Take 1 capsule (100 mg total) by mouth daily. 10 capsule 0  . escitalopram (LEXAPRO) 20 MG tablet Take 1 tablet (20 mg total) by mouth daily. 90 tablet 3  . levothyroxine (SYNTHROID, LEVOTHROID) 150 MCG tablet Take 1 tablet (150 mcg total) by mouth daily. 90 tablet 2  . Multiple Vitamins-Minerals (MULTIVITAMINS THER. W/MINERALS) TABS Take 1 tablet by mouth daily.    Marland Kitchen  nortriptyline (PAMELOR) 75 MG capsule Take 1 capsule (75 mg total) by mouth at bedtime. 90 capsule 1  . polyethylene glycol (MIRALAX / GLYCOLAX) packet Take 17 g by mouth daily. 14 each 0  . traMADol (ULTRAM) 50 MG tablet Take 1 tablet (50 mg total) by mouth every 6 (six) hours as needed for moderate pain. 30 tablet 2   No current facility-administered medications for this visit.     Objective: BP (!) 126/92 (BP Location: Left Arm, Patient Position: Sitting, Cuff Size: Large)   Pulse 93   Temp 97.7 F (36.5 C) (Oral)   Ht 5\' 11"  (1.803 m)   Wt 275 lb 12.8 oz (125.1 kg)   SpO2 95%   BMI 38.47 kg/m   Gen: NAD, resting comfortably CV: RRR no murmurs rubs or gallops Lungs: CTAB no crackles, wheeze, rhonchi Abdomen: soft/nontender/obese  Ext: 1+ edema Skin: warm, dry Neuro: tremor noted - more prominent than prior  Assessment/Plan:  Discussed serotonin syndrome risks again today and discussed trying to use tramadol very very sparingly considering 2 other agents with serotonergic effects.   (HFpEF) heart failure with preserved ejection fraction (HCC) S: drinking a lot of perhaps over hydrated- didn't cut down since last visit as planned. Still having some orthostatic symptoms in last week or two.  Shortness of breath has worsened.  A/P: weight is up 4 lbs in about a week, edema worse, shortness of breath not improved- we opted to trial 3 days of lasix and follow up if not improving. If improves can repeat prn in future- see avs instructions  Essential tremor S: tremors have been really bad since getting out of the hospital. He is not sure why A/P: we reviewed that patient use to be on propranolol and later changed to metoprolol by cardiology for a fib. Since ablation and remaining out of a fib- we discussed could trial propranolol back at low dose 10mg  BID  Essential hypertension S: controlled poorly off all rx today- with mild diastolic eelvation BP Readings from Last 3 Encounters:  03/17/18 (!) 126/92  03/16/18 117/79  03/08/18 118/74  A/P: We discussed blood pressure goal of <140/90. Continue current meds:  Will add back in propranolol primarily for essential tremor. Before starting this to do 3 days of lasix- may add lasix back in on top of propranolol in future for fluid excess     Future Appointments  Date Time Provider [place]  04/04/2018  9:20 AM Jettie Booze, MD [facility]  04/11/2018  8:40 AM Troy Sine, MD [facility]  04/19/2018 10:00 AM Marin Olp, MD [facility]  05/03/2018 11:15 AM Constance Haw, MD  [facility]  05/18/2018 10:20 AM Jamse Arn, MD [facility]   Meds ordered this encounter  Medications  . furosemide (LASIX) 20 MG tablet    Sig: Take 1 tablet (20 mg total) by mouth daily as needed.    Dispense:  30 tablet    Refill:  3  . propranolol (INDERAL) 10 MG tablet    Sig: TAKE 1 TABLET (10 MG TOTAL) BY MOUTH 2 (TWO) TIMES DAILY.    Dispense:  60 tablet    Refill:  3   Return precautions advised.  [name], MD

## 2018-03-18 DIAGNOSIS — J449 Chronic obstructive pulmonary disease, unspecified: Secondary | ICD-10-CM | POA: Diagnosis not present

## 2018-03-18 DIAGNOSIS — Z87891 Personal history of nicotine dependence: Secondary | ICD-10-CM | POA: Diagnosis not present

## 2018-03-18 DIAGNOSIS — Z7901 Long term (current) use of anticoagulants: Secondary | ICD-10-CM | POA: Diagnosis not present

## 2018-03-18 DIAGNOSIS — Z905 Acquired absence of kidney: Secondary | ICD-10-CM | POA: Diagnosis not present

## 2018-03-18 DIAGNOSIS — M109 Gout, unspecified: Secondary | ICD-10-CM | POA: Diagnosis not present

## 2018-03-18 DIAGNOSIS — M1990 Unspecified osteoarthritis, unspecified site: Secondary | ICD-10-CM | POA: Diagnosis not present

## 2018-03-18 DIAGNOSIS — Z86711 Personal history of pulmonary embolism: Secondary | ICD-10-CM | POA: Diagnosis not present

## 2018-03-18 DIAGNOSIS — Z8619 Personal history of other infectious and parasitic diseases: Secondary | ICD-10-CM | POA: Diagnosis not present

## 2018-03-18 DIAGNOSIS — I4891 Unspecified atrial fibrillation: Secondary | ICD-10-CM | POA: Diagnosis not present

## 2018-03-18 DIAGNOSIS — I129 Hypertensive chronic kidney disease with stage 1 through stage 4 chronic kidney disease, or unspecified chronic kidney disease: Secondary | ICD-10-CM | POA: Diagnosis not present

## 2018-03-18 DIAGNOSIS — G4733 Obstructive sleep apnea (adult) (pediatric): Secondary | ICD-10-CM | POA: Diagnosis not present

## 2018-03-18 DIAGNOSIS — Z85528 Personal history of other malignant neoplasm of kidney: Secondary | ICD-10-CM | POA: Diagnosis not present

## 2018-03-18 DIAGNOSIS — Z96653 Presence of artificial knee joint, bilateral: Secondary | ICD-10-CM | POA: Diagnosis not present

## 2018-03-18 DIAGNOSIS — Z79891 Long term (current) use of opiate analgesic: Secondary | ICD-10-CM | POA: Diagnosis not present

## 2018-03-18 DIAGNOSIS — I2510 Atherosclerotic heart disease of native coronary artery without angina pectoris: Secondary | ICD-10-CM | POA: Diagnosis not present

## 2018-03-18 DIAGNOSIS — Z22322 Carrier or suspected carrier of Methicillin resistant Staphylococcus aureus: Secondary | ICD-10-CM | POA: Diagnosis not present

## 2018-03-18 DIAGNOSIS — N183 Chronic kidney disease, stage 3 (moderate): Secondary | ICD-10-CM | POA: Diagnosis not present

## 2018-03-21 ENCOUNTER — Telehealth: Payer: Self-pay | Admitting: Family Medicine

## 2018-03-21 ENCOUNTER — Other Ambulatory Visit: Payer: Self-pay | Admitting: Family Medicine

## 2018-03-21 DIAGNOSIS — Z8619 Personal history of other infectious and parasitic diseases: Secondary | ICD-10-CM | POA: Diagnosis not present

## 2018-03-21 DIAGNOSIS — H43811 Vitreous degeneration, right eye: Secondary | ICD-10-CM | POA: Diagnosis not present

## 2018-03-21 DIAGNOSIS — Z22322 Carrier or suspected carrier of Methicillin resistant Staphylococcus aureus: Secondary | ICD-10-CM | POA: Diagnosis not present

## 2018-03-21 DIAGNOSIS — I4891 Unspecified atrial fibrillation: Secondary | ICD-10-CM | POA: Diagnosis not present

## 2018-03-21 DIAGNOSIS — Z79891 Long term (current) use of opiate analgesic: Secondary | ICD-10-CM | POA: Diagnosis not present

## 2018-03-21 DIAGNOSIS — Z96653 Presence of artificial knee joint, bilateral: Secondary | ICD-10-CM | POA: Diagnosis not present

## 2018-03-21 DIAGNOSIS — G4733 Obstructive sleep apnea (adult) (pediatric): Secondary | ICD-10-CM | POA: Diagnosis not present

## 2018-03-21 DIAGNOSIS — N183 Chronic kidney disease, stage 3 (moderate): Secondary | ICD-10-CM | POA: Diagnosis not present

## 2018-03-21 DIAGNOSIS — J449 Chronic obstructive pulmonary disease, unspecified: Secondary | ICD-10-CM | POA: Diagnosis not present

## 2018-03-21 DIAGNOSIS — Z7901 Long term (current) use of anticoagulants: Secondary | ICD-10-CM | POA: Diagnosis not present

## 2018-03-21 DIAGNOSIS — Z86711 Personal history of pulmonary embolism: Secondary | ICD-10-CM | POA: Diagnosis not present

## 2018-03-21 DIAGNOSIS — M109 Gout, unspecified: Secondary | ICD-10-CM | POA: Diagnosis not present

## 2018-03-21 DIAGNOSIS — Z85528 Personal history of other malignant neoplasm of kidney: Secondary | ICD-10-CM | POA: Diagnosis not present

## 2018-03-21 DIAGNOSIS — I2510 Atherosclerotic heart disease of native coronary artery without angina pectoris: Secondary | ICD-10-CM | POA: Diagnosis not present

## 2018-03-21 DIAGNOSIS — Z905 Acquired absence of kidney: Secondary | ICD-10-CM | POA: Diagnosis not present

## 2018-03-21 DIAGNOSIS — M1990 Unspecified osteoarthritis, unspecified site: Secondary | ICD-10-CM | POA: Diagnosis not present

## 2018-03-21 DIAGNOSIS — Z87891 Personal history of nicotine dependence: Secondary | ICD-10-CM | POA: Diagnosis not present

## 2018-03-21 DIAGNOSIS — I129 Hypertensive chronic kidney disease with stage 1 through stage 4 chronic kidney disease, or unspecified chronic kidney disease: Secondary | ICD-10-CM | POA: Diagnosis not present

## 2018-03-21 NOTE — Telephone Encounter (Signed)
Returned call to pt.  Based on office note of 03/17/18, advised that Dr. Yong Channel recommended to start Propranolol 10 mg BID, and that this has been sent to the [facility]. On [address].  Pt. verb. understanding.

## 2018-03-21 NOTE — Telephone Encounter (Signed)
Patient came in office in reference to needing permanent handicap placard. Please advise.

## 2018-03-21 NOTE — Telephone Encounter (Signed)
Copied from [place] [phone]. Topic: Quick Communication - See Telephone Encounter >> Mar 21, 2018  1:55 PM Mylinda Latina, NT wrote: CRM for notification. See Telephone encounter for: 03/21/18. Patient called and states that he would like to know the name of the medication that Dr. Yong Channel put him on for Tremors. Patient is requesting a call back. CB#[phone]

## 2018-03-21 NOTE — Telephone Encounter (Signed)
Copied from [place] [phone]. Topic: General - Other >> Mar 21, 2018 10:51 AM Carolyn Stare wrote:  V a PT  with [facility] call to follow up on pt taking florsemide. She said she has a few questions

## 2018-03-22 DIAGNOSIS — Z79891 Long term (current) use of opiate analgesic: Secondary | ICD-10-CM | POA: Diagnosis not present

## 2018-03-22 DIAGNOSIS — I2510 Atherosclerotic heart disease of native coronary artery without angina pectoris: Secondary | ICD-10-CM | POA: Diagnosis not present

## 2018-03-22 DIAGNOSIS — J449 Chronic obstructive pulmonary disease, unspecified: Secondary | ICD-10-CM | POA: Diagnosis not present

## 2018-03-22 DIAGNOSIS — Z87891 Personal history of nicotine dependence: Secondary | ICD-10-CM | POA: Diagnosis not present

## 2018-03-22 DIAGNOSIS — M109 Gout, unspecified: Secondary | ICD-10-CM | POA: Diagnosis not present

## 2018-03-22 DIAGNOSIS — Z86711 Personal history of pulmonary embolism: Secondary | ICD-10-CM | POA: Diagnosis not present

## 2018-03-22 DIAGNOSIS — M1990 Unspecified osteoarthritis, unspecified site: Secondary | ICD-10-CM | POA: Diagnosis not present

## 2018-03-22 DIAGNOSIS — G4733 Obstructive sleep apnea (adult) (pediatric): Secondary | ICD-10-CM | POA: Diagnosis not present

## 2018-03-22 DIAGNOSIS — I4891 Unspecified atrial fibrillation: Secondary | ICD-10-CM | POA: Diagnosis not present

## 2018-03-22 DIAGNOSIS — N183 Chronic kidney disease, stage 3 (moderate): Secondary | ICD-10-CM | POA: Diagnosis not present

## 2018-03-22 DIAGNOSIS — Z22322 Carrier or suspected carrier of Methicillin resistant Staphylococcus aureus: Secondary | ICD-10-CM | POA: Diagnosis not present

## 2018-03-22 DIAGNOSIS — I129 Hypertensive chronic kidney disease with stage 1 through stage 4 chronic kidney disease, or unspecified chronic kidney disease: Secondary | ICD-10-CM | POA: Diagnosis not present

## 2018-03-22 DIAGNOSIS — Z905 Acquired absence of kidney: Secondary | ICD-10-CM | POA: Diagnosis not present

## 2018-03-22 DIAGNOSIS — Z8619 Personal history of other infectious and parasitic diseases: Secondary | ICD-10-CM | POA: Diagnosis not present

## 2018-03-22 DIAGNOSIS — Z96653 Presence of artificial knee joint, bilateral: Secondary | ICD-10-CM | POA: Diagnosis not present

## 2018-03-22 DIAGNOSIS — Z85528 Personal history of other malignant neoplasm of kidney: Secondary | ICD-10-CM | POA: Diagnosis not present

## 2018-03-22 DIAGNOSIS — Z7901 Long term (current) use of anticoagulants: Secondary | ICD-10-CM | POA: Diagnosis not present

## 2018-03-23 DIAGNOSIS — I4891 Unspecified atrial fibrillation: Secondary | ICD-10-CM | POA: Diagnosis not present

## 2018-03-23 DIAGNOSIS — Z87891 Personal history of nicotine dependence: Secondary | ICD-10-CM | POA: Diagnosis not present

## 2018-03-23 DIAGNOSIS — Z86711 Personal history of pulmonary embolism: Secondary | ICD-10-CM | POA: Diagnosis not present

## 2018-03-23 DIAGNOSIS — I2510 Atherosclerotic heart disease of native coronary artery without angina pectoris: Secondary | ICD-10-CM | POA: Diagnosis not present

## 2018-03-23 DIAGNOSIS — Z905 Acquired absence of kidney: Secondary | ICD-10-CM | POA: Diagnosis not present

## 2018-03-23 DIAGNOSIS — Z8619 Personal history of other infectious and parasitic diseases: Secondary | ICD-10-CM | POA: Diagnosis not present

## 2018-03-23 DIAGNOSIS — Z79891 Long term (current) use of opiate analgesic: Secondary | ICD-10-CM | POA: Diagnosis not present

## 2018-03-23 DIAGNOSIS — Z22322 Carrier or suspected carrier of Methicillin resistant Staphylococcus aureus: Secondary | ICD-10-CM | POA: Diagnosis not present

## 2018-03-23 DIAGNOSIS — M109 Gout, unspecified: Secondary | ICD-10-CM | POA: Diagnosis not present

## 2018-03-23 DIAGNOSIS — Z7901 Long term (current) use of anticoagulants: Secondary | ICD-10-CM | POA: Diagnosis not present

## 2018-03-23 DIAGNOSIS — I129 Hypertensive chronic kidney disease with stage 1 through stage 4 chronic kidney disease, or unspecified chronic kidney disease: Secondary | ICD-10-CM | POA: Diagnosis not present

## 2018-03-23 DIAGNOSIS — N183 Chronic kidney disease, stage 3 (moderate): Secondary | ICD-10-CM | POA: Diagnosis not present

## 2018-03-23 DIAGNOSIS — Z96653 Presence of artificial knee joint, bilateral: Secondary | ICD-10-CM | POA: Diagnosis not present

## 2018-03-23 DIAGNOSIS — G4733 Obstructive sleep apnea (adult) (pediatric): Secondary | ICD-10-CM | POA: Diagnosis not present

## 2018-03-23 DIAGNOSIS — J449 Chronic obstructive pulmonary disease, unspecified: Secondary | ICD-10-CM | POA: Diagnosis not present

## 2018-03-23 DIAGNOSIS — Z85528 Personal history of other malignant neoplasm of kidney: Secondary | ICD-10-CM | POA: Diagnosis not present

## 2018-03-23 DIAGNOSIS — M1990 Unspecified osteoarthritis, unspecified site: Secondary | ICD-10-CM | POA: Diagnosis not present

## 2018-03-24 ENCOUNTER — Other Ambulatory Visit: Payer: Self-pay | Admitting: Family Medicine

## 2018-03-25 ENCOUNTER — Telehealth: Payer: Self-pay | Admitting: Family Medicine

## 2018-03-25 LAB — PSA

## 2018-03-25 NOTE — Telephone Encounter (Signed)
MEDICATION:  Trazodone 50 mg  PHARMACY:   [facility] [address], [place] [phone] (Phone) [phone] (Fax)    IS THIS A 90 DAY SUPPLY : Y  IS PATIENT OUT OF MEDICATION: N  IF NOT; HOW MUCH IS LEFT: 5 pills left   LAST APPOINTMENT DATE: @9 /26/2019  NEXT APPOINTMENT DATE:@10 /22/2019  OTHER COMMENTS: Pt stated he is going out of town tomorrow 03/26/18 and needs this refilled ASAP. Pt stated pharmacy called yesterday for a refill but have not received it yet. Please advise.    **Let patient know to contact pharmacy at the end of the day to make sure medication is ready. **  ** Please notify patient to allow 48-72 hours to process**  **Encourage patient to contact the pharmacy for refills or they can request refills through [facility]**

## 2018-03-25 NOTE — Telephone Encounter (Signed)
We had stopped the trazodone due to him being on amiodarone which could cause QT prolongation.  Looks like he is no longer on amiodarone-please call to verify just to be on the safe side.  If he is not on amiodarone-you may refill the trazodone

## 2018-03-28 NOTE — Telephone Encounter (Signed)
Spoke with patient who is checking to see if he is still taking amiodarone that will interact with Trazadone. Patient is supposed to call back and let us know so we know if we can refill this or not

## 2018-03-28 NOTE — Telephone Encounter (Signed)
Called and spoke with patient who stated his wife handles his medications and he doesn't know if he is still on the amiodarone. He is going to check with her and call back to let us know. If he is not, We can refill the Trazadone.

## 2018-03-29 DIAGNOSIS — N183 Chronic kidney disease, stage 3 (moderate): Secondary | ICD-10-CM | POA: Diagnosis not present

## 2018-03-29 DIAGNOSIS — J449 Chronic obstructive pulmonary disease, unspecified: Secondary | ICD-10-CM | POA: Diagnosis not present

## 2018-03-29 DIAGNOSIS — Z85528 Personal history of other malignant neoplasm of kidney: Secondary | ICD-10-CM | POA: Diagnosis not present

## 2018-03-29 DIAGNOSIS — Z79891 Long term (current) use of opiate analgesic: Secondary | ICD-10-CM | POA: Diagnosis not present

## 2018-03-29 DIAGNOSIS — I2510 Atherosclerotic heart disease of native coronary artery without angina pectoris: Secondary | ICD-10-CM | POA: Diagnosis not present

## 2018-03-29 DIAGNOSIS — I129 Hypertensive chronic kidney disease with stage 1 through stage 4 chronic kidney disease, or unspecified chronic kidney disease: Secondary | ICD-10-CM | POA: Diagnosis not present

## 2018-03-29 DIAGNOSIS — M1990 Unspecified osteoarthritis, unspecified site: Secondary | ICD-10-CM | POA: Diagnosis not present

## 2018-03-29 DIAGNOSIS — Z22322 Carrier or suspected carrier of Methicillin resistant Staphylococcus aureus: Secondary | ICD-10-CM | POA: Diagnosis not present

## 2018-03-29 DIAGNOSIS — I4891 Unspecified atrial fibrillation: Secondary | ICD-10-CM | POA: Diagnosis not present

## 2018-03-29 DIAGNOSIS — Z905 Acquired absence of kidney: Secondary | ICD-10-CM | POA: Diagnosis not present

## 2018-03-29 DIAGNOSIS — G4733 Obstructive sleep apnea (adult) (pediatric): Secondary | ICD-10-CM | POA: Diagnosis not present

## 2018-03-29 DIAGNOSIS — Z87891 Personal history of nicotine dependence: Secondary | ICD-10-CM | POA: Diagnosis not present

## 2018-03-29 DIAGNOSIS — M109 Gout, unspecified: Secondary | ICD-10-CM | POA: Diagnosis not present

## 2018-03-29 DIAGNOSIS — Z96653 Presence of artificial knee joint, bilateral: Secondary | ICD-10-CM | POA: Diagnosis not present

## 2018-03-29 DIAGNOSIS — Z7901 Long term (current) use of anticoagulants: Secondary | ICD-10-CM | POA: Diagnosis not present

## 2018-03-29 DIAGNOSIS — Z86711 Personal history of pulmonary embolism: Secondary | ICD-10-CM | POA: Diagnosis not present

## 2018-03-29 DIAGNOSIS — Z8619 Personal history of other infectious and parasitic diseases: Secondary | ICD-10-CM | POA: Diagnosis not present

## 2018-03-29 NOTE — Telephone Encounter (Signed)
Spoke with patient's wife who confirmed that he is not taking amiodarone. I will send prescription in for Trazadone.

## 2018-03-31 ENCOUNTER — Telehealth: Payer: Self-pay

## 2018-03-31 DIAGNOSIS — N183 Chronic kidney disease, stage 3 (moderate): Secondary | ICD-10-CM | POA: Diagnosis not present

## 2018-03-31 DIAGNOSIS — M1990 Unspecified osteoarthritis, unspecified site: Secondary | ICD-10-CM | POA: Diagnosis not present

## 2018-03-31 DIAGNOSIS — Z7901 Long term (current) use of anticoagulants: Secondary | ICD-10-CM | POA: Diagnosis not present

## 2018-03-31 DIAGNOSIS — I4891 Unspecified atrial fibrillation: Secondary | ICD-10-CM | POA: Diagnosis not present

## 2018-03-31 DIAGNOSIS — Z85528 Personal history of other malignant neoplasm of kidney: Secondary | ICD-10-CM | POA: Diagnosis not present

## 2018-03-31 DIAGNOSIS — I2510 Atherosclerotic heart disease of native coronary artery without angina pectoris: Secondary | ICD-10-CM | POA: Diagnosis not present

## 2018-03-31 DIAGNOSIS — Z8619 Personal history of other infectious and parasitic diseases: Secondary | ICD-10-CM | POA: Diagnosis not present

## 2018-03-31 DIAGNOSIS — Z22322 Carrier or suspected carrier of Methicillin resistant Staphylococcus aureus: Secondary | ICD-10-CM | POA: Diagnosis not present

## 2018-03-31 DIAGNOSIS — Z86711 Personal history of pulmonary embolism: Secondary | ICD-10-CM | POA: Diagnosis not present

## 2018-03-31 DIAGNOSIS — Z79891 Long term (current) use of opiate analgesic: Secondary | ICD-10-CM | POA: Diagnosis not present

## 2018-03-31 DIAGNOSIS — Z87891 Personal history of nicotine dependence: Secondary | ICD-10-CM | POA: Diagnosis not present

## 2018-03-31 DIAGNOSIS — Z905 Acquired absence of kidney: Secondary | ICD-10-CM | POA: Diagnosis not present

## 2018-03-31 DIAGNOSIS — Z96653 Presence of artificial knee joint, bilateral: Secondary | ICD-10-CM | POA: Diagnosis not present

## 2018-03-31 DIAGNOSIS — I129 Hypertensive chronic kidney disease with stage 1 through stage 4 chronic kidney disease, or unspecified chronic kidney disease: Secondary | ICD-10-CM | POA: Diagnosis not present

## 2018-03-31 DIAGNOSIS — J449 Chronic obstructive pulmonary disease, unspecified: Secondary | ICD-10-CM | POA: Diagnosis not present

## 2018-03-31 DIAGNOSIS — M109 Gout, unspecified: Secondary | ICD-10-CM | POA: Diagnosis not present

## 2018-03-31 DIAGNOSIS — G4733 Obstructive sleep apnea (adult) (pediatric): Secondary | ICD-10-CM | POA: Diagnosis not present

## 2018-03-31 NOTE — Telephone Encounter (Signed)
Wayne Booth PT [facility] called requesting an extension of verbal orders for 1xwk X 3wks.  Called him back and approved verbal orders.

## 2018-04-01 ENCOUNTER — Encounter: Payer: Self-pay | Admitting: Family Medicine

## 2018-04-04 ENCOUNTER — Ambulatory Visit: Payer: Medicare Other | Admitting: Interventional Cardiology

## 2018-04-05 NOTE — Telephone Encounter (Signed)
This was completed

## 2018-04-06 DIAGNOSIS — L989 Disorder of the skin and subcutaneous tissue, unspecified: Secondary | ICD-10-CM | POA: Diagnosis not present

## 2018-04-07 ENCOUNTER — Telehealth: Payer: Self-pay | Admitting: Family Medicine

## 2018-04-07 NOTE — Telephone Encounter (Signed)
Copied from [place] [phone]. Topic: Quick Communication - See Telephone Encounter >> Apr 07, 2018  3:52 PM Rutherford Nail, [place] wrote: CRM for notification. See Telephone encounter for: 04/07/18. Margaretha Sheffield with [facility] calling and states that they have tried to make skilled nursing visit with patient but are unable to locate patient. States that they have called the patient, his wife, and 2 emergency contacts. Pt has not returned call. CB#: [phone]

## 2018-04-07 NOTE — Telephone Encounter (Signed)
See note

## 2018-04-08 ENCOUNTER — Other Ambulatory Visit: Payer: Self-pay

## 2018-04-08 ENCOUNTER — Inpatient Hospital Stay (HOSPITAL_COMMUNITY)
Admission: EM | Admit: 2018-04-08 | Discharge: 2018-04-11 | DRG: 291 | Disposition: A | Discharge status: Home Health | Payer: Medicare Other | Attending: Internal Medicine | Admitting: Internal Medicine

## 2018-04-08 ENCOUNTER — Encounter (HOSPITAL_COMMUNITY): Payer: Self-pay

## 2018-04-08 ENCOUNTER — Emergency Department (HOSPITAL_COMMUNITY): Payer: Medicare Other

## 2018-04-08 DIAGNOSIS — N183 Chronic kidney disease, stage 3 unspecified: Secondary | ICD-10-CM | POA: Diagnosis present

## 2018-04-08 DIAGNOSIS — I5030 Unspecified diastolic (congestive) heart failure: Secondary | ICD-10-CM | POA: Diagnosis not present

## 2018-04-08 DIAGNOSIS — G8929 Other chronic pain: Secondary | ICD-10-CM | POA: Diagnosis not present

## 2018-04-08 DIAGNOSIS — R0902 Hypoxemia: Secondary | ICD-10-CM

## 2018-04-08 DIAGNOSIS — Z905 Acquired absence of kidney: Secondary | ICD-10-CM

## 2018-04-08 DIAGNOSIS — E890 Postprocedural hypothyroidism: Secondary | ICD-10-CM | POA: Diagnosis not present

## 2018-04-08 DIAGNOSIS — J918 Pleural effusion in other conditions classified elsewhere: Secondary | ICD-10-CM | POA: Diagnosis present

## 2018-04-08 DIAGNOSIS — Z8546 Personal history of malignant neoplasm of prostate: Secondary | ICD-10-CM

## 2018-04-08 DIAGNOSIS — Z79890 Hormone replacement therapy (postmenopausal): Secondary | ICD-10-CM

## 2018-04-08 DIAGNOSIS — I4892 Unspecified atrial flutter: Secondary | ICD-10-CM | POA: Diagnosis not present

## 2018-04-08 DIAGNOSIS — I4891 Unspecified atrial fibrillation: Secondary | ICD-10-CM | POA: Diagnosis not present

## 2018-04-08 DIAGNOSIS — I130 Hypertensive heart and chronic kidney disease with heart failure and stage 1 through stage 4 chronic kidney disease, or unspecified chronic kidney disease: Secondary | ICD-10-CM | POA: Diagnosis not present

## 2018-04-08 DIAGNOSIS — I129 Hypertensive chronic kidney disease with stage 1 through stage 4 chronic kidney disease, or unspecified chronic kidney disease: Secondary | ICD-10-CM | POA: Diagnosis not present

## 2018-04-08 DIAGNOSIS — N182 Chronic kidney disease, stage 2 (mild): Secondary | ICD-10-CM | POA: Diagnosis present

## 2018-04-08 DIAGNOSIS — Z96653 Presence of artificial knee joint, bilateral: Secondary | ICD-10-CM | POA: Diagnosis not present

## 2018-04-08 DIAGNOSIS — I480 Paroxysmal atrial fibrillation: Secondary | ICD-10-CM | POA: Diagnosis not present

## 2018-04-08 DIAGNOSIS — Z22322 Carrier or suspected carrier of Methicillin resistant Staphylococcus aureus: Secondary | ICD-10-CM | POA: Diagnosis not present

## 2018-04-08 DIAGNOSIS — R1012 Left upper quadrant pain: Secondary | ICD-10-CM | POA: Diagnosis not present

## 2018-04-08 DIAGNOSIS — Z981 Arthrodesis status: Secondary | ICD-10-CM

## 2018-04-08 DIAGNOSIS — Z87442 Personal history of urinary calculi: Secondary | ICD-10-CM

## 2018-04-08 DIAGNOSIS — Z800 Family history of malignant neoplasm of digestive organs: Secondary | ICD-10-CM

## 2018-04-08 DIAGNOSIS — J9601 Acute respiratory failure with hypoxia: Secondary | ICD-10-CM

## 2018-04-08 DIAGNOSIS — E039 Hypothyroidism, unspecified: Secondary | ICD-10-CM | POA: Diagnosis not present

## 2018-04-08 DIAGNOSIS — I5033 Acute on chronic diastolic (congestive) heart failure: Secondary | ICD-10-CM | POA: Diagnosis present

## 2018-04-08 DIAGNOSIS — Y95 Nosocomial condition: Secondary | ICD-10-CM

## 2018-04-08 DIAGNOSIS — K5900 Constipation, unspecified: Secondary | ICD-10-CM | POA: Diagnosis present

## 2018-04-08 DIAGNOSIS — Z79891 Long term (current) use of opiate analgesic: Secondary | ICD-10-CM | POA: Diagnosis not present

## 2018-04-08 DIAGNOSIS — J984 Other disorders of lung: Secondary | ICD-10-CM | POA: Diagnosis not present

## 2018-04-08 DIAGNOSIS — Z85528 Personal history of other malignant neoplasm of kidney: Secondary | ICD-10-CM | POA: Diagnosis not present

## 2018-04-08 DIAGNOSIS — M1990 Unspecified osteoarthritis, unspecified site: Secondary | ICD-10-CM | POA: Diagnosis not present

## 2018-04-08 DIAGNOSIS — Z7951 Long term (current) use of inhaled steroids: Secondary | ICD-10-CM

## 2018-04-08 DIAGNOSIS — J90 Pleural effusion, not elsewhere classified: Secondary | ICD-10-CM

## 2018-04-08 DIAGNOSIS — Z888 Allergy status to other drugs, medicaments and biological substances status: Secondary | ICD-10-CM

## 2018-04-08 DIAGNOSIS — Z87891 Personal history of nicotine dependence: Secondary | ICD-10-CM | POA: Diagnosis not present

## 2018-04-08 DIAGNOSIS — G894 Chronic pain syndrome: Secondary | ICD-10-CM

## 2018-04-08 DIAGNOSIS — G4733 Obstructive sleep apnea (adult) (pediatric): Secondary | ICD-10-CM | POA: Diagnosis present

## 2018-04-08 DIAGNOSIS — Z8619 Personal history of other infectious and parasitic diseases: Secondary | ICD-10-CM

## 2018-04-08 DIAGNOSIS — F329 Major depressive disorder, single episode, unspecified: Secondary | ICD-10-CM

## 2018-04-08 DIAGNOSIS — Z8614 Personal history of Methicillin resistant Staphylococcus aureus infection: Secondary | ICD-10-CM | POA: Diagnosis not present

## 2018-04-08 DIAGNOSIS — G250 Essential tremor: Secondary | ICD-10-CM | POA: Diagnosis present

## 2018-04-08 DIAGNOSIS — Z7901 Long term (current) use of anticoagulants: Secondary | ICD-10-CM

## 2018-04-08 DIAGNOSIS — I2510 Atherosclerotic heart disease of native coronary artery without angina pectoris: Secondary | ICD-10-CM | POA: Diagnosis not present

## 2018-04-08 DIAGNOSIS — Z86711 Personal history of pulmonary embolism: Secondary | ICD-10-CM | POA: Diagnosis not present

## 2018-04-08 DIAGNOSIS — Z8701 Personal history of pneumonia (recurrent): Secondary | ICD-10-CM

## 2018-04-08 DIAGNOSIS — F418 Other specified anxiety disorders: Secondary | ICD-10-CM | POA: Diagnosis present

## 2018-04-08 DIAGNOSIS — R0602 Shortness of breath: Secondary | ICD-10-CM

## 2018-04-08 DIAGNOSIS — J189 Pneumonia, unspecified organism: Secondary | ICD-10-CM | POA: Insufficient documentation

## 2018-04-08 DIAGNOSIS — F32A Depression, unspecified: Secondary | ICD-10-CM

## 2018-04-08 DIAGNOSIS — Z9049 Acquired absence of other specified parts of digestive tract: Secondary | ICD-10-CM

## 2018-04-08 DIAGNOSIS — Z86010 Personal history of colonic polyps: Secondary | ICD-10-CM

## 2018-04-08 DIAGNOSIS — J449 Chronic obstructive pulmonary disease, unspecified: Secondary | ICD-10-CM | POA: Diagnosis present

## 2018-04-08 DIAGNOSIS — Z8051 Family history of malignant neoplasm of kidney: Secondary | ICD-10-CM

## 2018-04-08 DIAGNOSIS — G4700 Insomnia, unspecified: Secondary | ICD-10-CM | POA: Diagnosis not present

## 2018-04-08 DIAGNOSIS — Z79899 Other long term (current) drug therapy: Secondary | ICD-10-CM

## 2018-04-08 DIAGNOSIS — R0600 Dyspnea, unspecified: Secondary | ICD-10-CM

## 2018-04-08 DIAGNOSIS — M109 Gout, unspecified: Secondary | ICD-10-CM | POA: Diagnosis not present

## 2018-04-08 DIAGNOSIS — Z6837 Body mass index (BMI) 37.0-37.9, adult: Secondary | ICD-10-CM | POA: Diagnosis not present

## 2018-04-08 LAB — COMPREHENSIVE METABOLIC PANEL
ALBUMIN: 3.1 g/dL — AB (ref 3.5–5.0)
ALT: 18 U/L (ref 0–44)
ANION GAP: 10 (ref 5–15)
AST: 21 U/L (ref 15–41)
Alkaline Phosphatase: 60 U/L (ref 38–126)
BILIRUBIN TOTAL: 0.5 mg/dL (ref 0.3–1.2)
BUN: 17 mg/dL (ref 8–23)
CO2: 25 mmol/L (ref 22–32)
Calcium: 8.8 mg/dL — ABNORMAL LOW (ref 8.9–10.3)
Chloride: 98 mmol/L (ref 98–111)
Creatinine, Ser: 1.45 mg/dL — ABNORMAL HIGH (ref 0.61–1.24)
GFR calc Af Amer: 53 mL/min — ABNORMAL LOW (ref >60)
GFR calc non Af Amer: 46 mL/min — ABNORMAL LOW (ref >60)
GLUCOSE: 114 mg/dL — AB (ref 70–99)
POTASSIUM: 3.7 mmol/L (ref 3.5–5.1)
Sodium: 133 mmol/L — ABNORMAL LOW (ref 135–145)
Total Protein: 6.5 g/dL (ref 6.5–8.1)

## 2018-04-08 LAB — URINALYSIS, ROUTINE W REFLEX MICROSCOPIC
Bilirubin Urine: NEGATIVE
GLUCOSE, UA: NEGATIVE mg/dL
Hgb urine dipstick: NEGATIVE
Ketones, ur: NEGATIVE mg/dL
LEUKOCYTES UA: NEGATIVE
NITRITE: NEGATIVE
PROTEIN: NEGATIVE mg/dL
Specific Gravity, Urine: 1.015 (ref 1.005–1.030)
pH: 5 (ref 5.0–8.0)

## 2018-04-08 LAB — I-STAT TROPONIN, ED: TROPONIN I, POC: 0 ng/mL (ref 0.00–0.08)

## 2018-04-08 LAB — LIPASE, BLOOD: Lipase: 42 U/L (ref 11–51)

## 2018-04-08 LAB — CBC
HCT: 45.2 % (ref 39.0–52.0)
Hemoglobin: 14 g/dL (ref 13.0–17.0)
MCH: 28.4 pg (ref 26.0–34.0)
MCHC: 31 g/dL (ref 30.0–36.0)
MCV: 91.7 fL (ref 80.0–100.0)
NRBC: 0 % (ref 0.0–0.2)
Platelets: 399 10*3/uL (ref 150–400)
RBC: 4.93 MIL/uL (ref 4.22–5.81)
RDW: 15.9 % — ABNORMAL HIGH (ref 11.5–15.5)
WBC: 12.2 10*3/uL — AB (ref 4.0–10.5)

## 2018-04-08 LAB — BRAIN NATRIURETIC PEPTIDE: B NATRIURETIC PEPTIDE 5: 62.9 pg/mL (ref 0.0–100.0)

## 2018-04-08 MED ORDER — VANCOMYCIN HCL 10 G IV SOLR
2000.0000 mg | Freq: Once | INTRAVENOUS | Status: AC
  Administered 2018-04-09: 2000 mg via INTRAVENOUS
  Filled 2018-04-08: qty 2000

## 2018-04-08 MED ORDER — SODIUM CHLORIDE 0.9 % IV SOLN
2.0000 g | Freq: Once | INTRAVENOUS | Status: AC
  Administered 2018-04-08: 2 g via INTRAVENOUS
  Filled 2018-04-08: qty 2

## 2018-04-08 NOTE — ED Provider Notes (Signed)
[facility] EMERGENCY DEPARTMENT Provider Note   CSN: [id number] Arrival date & time: 04/08/18  2112     History   Chief Complaint Chief Complaint  Patient presents with  . Abdominal Pain  . Shortness of Breath    HPI Wayne Booth is a 77 y.o. male.  Patient presents to the ED with chief complaint of shortness of breath and left upper abdominal pain.  Patient was recently admitted and was on life support for MRSA pneumonia.  After hospital discharge, he has been receiving home health.  Patient states that he has been having shortness of breath symptoms for the past several days.  He is fearful that his pneumonia is returning.  He denies any fever or productive cough.  However, he called his home health nurse, who said that his oxygen level was a little low, and he was running a mildly elevated temperature.  She contacted the on-call doctor, and patient was instructed to return to the emergency department for evaluation.  He denies any lower extremity swelling.  Denies any other associated symptoms.  His symptoms are aggravated with exertion, and he states that he cannot take more than a few steps without becoming extremely short of breath.  The history is provided by the patient. No language interpreter was used.    Past Medical History:  Diagnosis Date  . Allergy    States his nose runs when he is around grease.   Marland Kitchen Anxiety   . Arthritis   . Atrial fibrillation ([place])   . Cancer ([place])   . Coronary artery disease   . History of total knee replacement   . Hypertension   . Hypothyroidism   . Kidney cancer, primary, with metastasis from kidney to other site [facility])   . Kidney stone   . OSA (obstructive sleep apnea)    pt does not wear cpap at night  . Pituitary cyst ([place])   . Pneumonia    hx of  . Prostate cancer [facility])     Patient Active Problem List   Diagnosis Date Noted  . Labile blood pressure   . Steroid-induced hyperglycemia   . Chronic kidney  disease (CKD), stage III (moderate) (HCC)   . Debility 02/26/2018  . HCAP (healthcare-associated pneumonia) 02/03/2018  . Constipation 01/21/2018  . (HFpEF) heart failure with preserved ejection fraction ([place]) 12/16/2017  . Chest pain   . Anticoagulated   . Atrial flutter ([place]) 11/01/2017  . Bilateral foot pain 07/13/2017  . Community acquired pneumonia 06/18/2017  . Shortness of breath 06/14/2017  . History of adenomatous polyp of colon 04/12/2017  . History of pulmonary embolism 10/23/2016  . Cervical disc disease 11/13/2015  . Essential tremor 08/14/2015  . Gout 08/14/2015  . Hypothyroidism 07/12/2014  . Hyperglycemia 07/12/2014  . GAD (generalized anxiety disorder) 11/07/2013  . OSA (obstructive sleep apnea) 07/07/2011  . Asthmatic bronchitis 06/09/2011  . Actinic keratosis 11/27/2009  . Osteoarthritis 11/13/2008  . 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 Morbid obesity ([place]) 10/02/2008  . History of renal cell carcinoma 12/26/2007  . Insomnia 12/26/2007  . TESTOSTERONE DEFICIENCY 12/28/2006  . Essential hypertension 12/28/2006    Past Surgical History:  Procedure Laterality Date  . ABLATION OF DYSRHYTHMIC FOCUS  01/28/2018  . ANTERIOR CERVICAL DECOMP/DISCECTOMY FUSION N/A 08/29/2012   Procedure: ANTERIOR CERVICAL DECOMPRESSION/DISCECTOMY FUSION 1 LEVEL;  Surgeon: Eustace Moore, MD;  Location: [place] [facility];  Service: Neurosurgery;  Laterality: N/A;  . APPENDECTOMY    . ATRIAL FIBRILLATION  ABLATION N/A 01/28/2018   Procedure: ATRIAL FIBRILLATION ABLATION;  Surgeon: Constance Haw, MD;  Location: [place] CV LAB;  Service: Cardiovascular;  Laterality: N/A;  . CARDIAC CATHETERIZATION  2008   clean  . CHOLECYSTECTOMY    . COLONOSCOPY W/ POLYPECTOMY    . CRANIOTOMY N/A 11/08/2012   Procedure: CRANIOTOMY HYPOPHYSECTOMY TRANSNASAL APPROACH;  Surgeon: Faythe Ghee, MD;  Location: [place] [facility];  Service: Neurosurgery;  Laterality: N/A;  Transphenoidal Resection of  Pituitary Tumor  . FINGER SURGERY Left 2017   Dr Amedeo Plenty  . INSERTION PROSTATE RADIATION SEED    . KIDNEY STONE SURGERY    . KNEE ARTHROSCOPY  2007   left  . NEPHRECTOMY Left   . POSTERIOR CERVICAL LAMINECTOMY Left 04/24/2015   Procedure: Foraminotomy cervical five - cervical six cervical six - cervical seven left;  Surgeon: Eustace Moore, MD;  Location: [facility];  Service: Neurosurgery;  Laterality: Left;  . REPLACEMENT TOTAL KNEE BILATERAL          Home Medications    Prior to Admission medications   Medication Sig Start Date End Date Taking? Authorizing Provider  albuterol (PROVENTIL HFA;VENTOLIN HFA) 108 (90 Base) MCG/ACT inhaler Inhale 2 puffs into the lungs every 6 (six) hours as needed for wheezing or shortness of breath. 03/04/18   Angiulli, Lavon Paganini, PA-C  allopurinol (ZYLOPRIM) 100 MG tablet Take 1 tablet (100 mg total) by mouth daily. 03/08/18   Marin Olp, MD  apixaban (ELIQUIS) 5 MG TABS tablet Take 1 tablet (5 mg total) by mouth 2 (two) times daily. 03/08/18   Marin Olp, MD  budesonide-formoterol [facility]) 160-4.5 MCG/ACT inhaler Inhale 2 puffs into the lungs 2 (two) times daily. 03/09/18   Marin Olp, MD  colchicine 0.6 MG tablet Take 1 tablet (0.6 mg total) by mouth 2 (two) times daily. 03/04/18   Angiulli, Lavon Paganini, PA-C  cyclobenzaprine (FLEXERIL) 10 MG tablet Take 1 tablet (10 mg total) by mouth 3 (three) times daily as needed for muscle spasms. 03/04/18   Angiulli, Lavon Paganini, PA-C  docusate sodium (COLACE) 100 MG capsule Take 1 capsule (100 mg total) by mouth daily. 02/26/18   Isaac Bliss, Rayford Halsted, MD  escitalopram (LEXAPRO) 20 MG tablet Take 1 tablet (20 mg total) by mouth daily. 03/04/18   Angiulli, Lavon Paganini, PA-C  furosemide (LASIX) 20 MG tablet TAKE ONE TABLET BY MOUTH DAILY AS NEEDED 03/21/18   Marin Olp, MD  levothyroxine (SYNTHROID, LEVOTHROID) 150 MCG tablet Take 1 tablet (150 mcg total) by mouth daily. 03/04/18   Angiulli, Lavon Paganini,  PA-C  Multiple Vitamins-Minerals (MULTIVITAMINS THER. W/MINERALS) TABS Take 1 tablet by mouth daily.    [provider]  nortriptyline (PAMELOR) 75 MG capsule Take 1 capsule (75 mg total) by mouth at bedtime. 03/04/18   Angiulli, Lavon Paganini, PA-C  polyethylene glycol (MIRALAX / GLYCOLAX) packet Take 17 g by mouth daily. 03/04/18   Angiulli, Lavon Paganini, PA-C  propranolol (INDERAL) 10 MG tablet TAKE 1 TABLET (10 MG TOTAL) BY MOUTH 2 (TWO) TIMES DAILY. 03/17/18   Marin Olp, MD  traMADol (ULTRAM) 50 MG tablet Take 1 tablet (50 mg total) by mouth every 6 (six) hours as needed for moderate pain. 03/08/18   Marin Olp, MD  traZODone (DESYREL) 50 MG tablet TAKE ONE HALF TO ONE TABLET BY MOUTH EVERY NIGHT AT BEDTIME AS NEEDED FOR SLEEP 03/29/18   Marin Olp, MD    Family History Family  History  Problem Relation Age of Onset  . Cancer Father        ?mouth, died before patient born  . Cancer Mother        stomach, died when he was young  . Kidney cancer Sister   . Colon cancer Brother 73  . Esophageal cancer Neg Hx   . Rectal cancer Neg Hx   . Stomach cancer Neg Hx     Social History Social History   Tobacco Use  . Smoking status: Former Smoker    Years: 4.00    Types: Cigars  . Smokeless tobacco: Never Used  . Tobacco comment: QUIT 2 YEARS AGO   2017  Substance Use Topics  . Alcohol use: Yes    Alcohol/week: 2.0 standard drinks    Types: 2 Shots of liquor per week    Comment: socially  . Drug use: No     Allergies   Ambien [zolpidem]   Review of Systems Review of Systems  All other systems reviewed and are negative.    Physical Exam Updated Vital Signs BP 126/81   Pulse 88   Temp 98.1 F (36.7 C) (Oral)   Resp (!) 25   Ht 5\' 11"  (1.803 m)   Wt 122.9 kg   SpO2 95%   BMI 37.80 kg/m   Physical Exam  Constitutional: He is oriented to person, place, and time. He appears well-developed and well-nourished.  HENT:  Head: Normocephalic and  atraumatic.  Eyes: Pupils are equal, round, and reactive to light. Conjunctivae and EOM are normal. Right eye exhibits no discharge. Left eye exhibits no discharge. No scleral icterus.  Neck: Normal range of motion. Neck supple. No JVD present.  Cardiovascular: Normal rate, regular rhythm and normal heart sounds. Exam reveals no gallop and no friction rub.  No murmur heard. Pulmonary/Chest: Effort normal and breath sounds normal. No respiratory distress. He has no wheezes. He has no rales. He exhibits no tenderness.  Abdominal: Soft. He exhibits no distension and no mass. There is no tenderness. There is no rebound and no guarding.  Musculoskeletal: Normal range of motion. He exhibits no edema or tenderness.  Neurological: He is alert and oriented to person, place, and time.  Skin: Skin is warm and dry.  Psychiatric: He has a normal mood and affect. His behavior is normal. Judgment and thought content normal.  Nursing note and vitals reviewed.    ED Treatments / Results  Labs (all labs ordered are listed, but only abnormal results are displayed) Labs Reviewed  COMPREHENSIVE METABOLIC PANEL - Abnormal; Notable for the following components:      Result Value   Sodium 133 (*)    Glucose, Bld 114 (*)    Creatinine, Ser 1.45 (*)    Calcium 8.8 (*)    Albumin 3.1 (*)    GFR calc non Af Amer 46 (*)    GFR calc Af Amer 53 (*)    All other components within normal limits  CBC - Abnormal; Notable for the following components:   WBC 12.2 (*)    RDW 15.9 (*)    All other components within normal limits  LIPASE, BLOOD  URINALYSIS, ROUTINE W REFLEX MICROSCOPIC  BRAIN NATRIURETIC PEPTIDE  I-STAT TROPONIN, ED    EKG EKG Interpretation  Date/Time:  Friday April 08 2018 21:16:20 EDT Ventricular Rate:  91 PR Interval:  202 QRS Duration: 100 QT Interval:  366 QTC Calculation: 450 R Axis:   4 Text Interpretation:  Normal sinus  rhythm Low voltage QRS Cannot rule out Anteroseptal infarct  , age undetermined No significant change since last tracing Confirmed by Blanchie Dessert [phone]) on 04/08/2018 10:10:25 PM   Radiology Dg Chest 2 View  Result Date: 04/08/2018 CLINICAL DATA:  Shortness of breath EXAM: CHEST - 2 VIEW COMPARISON:  02/22/2018 FINDINGS: Cardiac enlargement. Pulmonary vascularity is normal. Moderate left and small right pleural effusions. Basilar atelectasis or consolidation more prominent on the left. This is progressing since previous study. No pneumothorax. Calcification of the aorta. Degenerative changes in the spine. IMPRESSION: Bilateral pleural effusions with basilar atelectasis or consolidation, greater on the left. Progression since previous study. Electronically Signed   By: Lucienne Capers M.D.   On: 04/08/2018 22:17    Procedures Procedures (including critical care time)  Medications Ordered in ED Medications - No data to display   Initial Impression / Assessment and Plan / ED Course  I have reviewed the triage vital signs and the nursing notes.  Pertinent labs & imaging results that were available during my care of the patient were reviewed by me and considered in my medical decision making (see chart for details).     Patient with recent hospital admission for MRSA pneumonia, currently getting home health, returns to the ER tonight for shortness of breath.  O2 has been low and is 88% while transferring to bed.  Patient doesn't feel like he can walk more than a few steps without need to rest.  He does not wear oxygen at baseline.  He has not had a productive cough, but does state that he has had mildly elevated temperature according to his home health nurse.  He does have a mild leukocytosis to 12.5, which is trending up from his recent hospital admission.  He is not on any antibiotic therapy at this time.  Chest x-ray shows bilateral pleural effusions with possible consolidation in the bases.  His last echocardiogram on 02/04/2018 shows EF of 50  to 55%.  He is anticoagulated on Eliquis for A. fib, but has not had any problems with this since having an ablation.  Seen by and discussed with Dr. Maryan Rued, who recommends admission.  Final Clinical Impressions(s) / ED Diagnoses   Final diagnoses:  Hypoxia  Shortness of breath    ED Discharge Orders    None       Montine Circle, PA-C 04/08/18 2341    Blanchie Dessert, MD 04/08/18 2345

## 2018-04-08 NOTE — ED Triage Notes (Signed)
Pt here for evaluation of shortness of breath and left sided abdominal/flank pain. Pt was recently admitted for pneumonia. Had MRSA on previous admission.  Diminished lung sounds in the bases.  A&Ox4.

## 2018-04-08 NOTE — ED Notes (Signed)
Pt to x-ray and then D35

## 2018-04-08 NOTE — ED Notes (Signed)
ED Provider at bedside. 

## 2018-04-08 NOTE — ED Notes (Signed)
Pt desaturates to 88% while repositioning himself on the bed, RR rate increased to 40 as well.

## 2018-04-08 NOTE — ED Notes (Signed)
While ambulating, pt O2 sat never fell below 95%. After returning to the sitting position, pt O2 sat maintained around 91%, dropping as low as 88%. Pt returned to 99% saturation after being in a seated position for 5 minutes.

## 2018-04-09 ENCOUNTER — Inpatient Hospital Stay (HOSPITAL_COMMUNITY): Payer: Medicare Other

## 2018-04-09 DIAGNOSIS — N183 Chronic kidney disease, stage 3 (moderate): Secondary | ICD-10-CM

## 2018-04-09 DIAGNOSIS — G894 Chronic pain syndrome: Secondary | ICD-10-CM

## 2018-04-09 DIAGNOSIS — I4892 Unspecified atrial flutter: Secondary | ICD-10-CM

## 2018-04-09 DIAGNOSIS — G8929 Other chronic pain: Secondary | ICD-10-CM

## 2018-04-09 DIAGNOSIS — F329 Major depressive disorder, single episode, unspecified: Secondary | ICD-10-CM

## 2018-04-09 DIAGNOSIS — E890 Postprocedural hypothyroidism: Secondary | ICD-10-CM

## 2018-04-09 DIAGNOSIS — J9601 Acute respiratory failure with hypoxia: Secondary | ICD-10-CM

## 2018-04-09 DIAGNOSIS — F32A Depression, unspecified: Secondary | ICD-10-CM

## 2018-04-09 DIAGNOSIS — I2510 Atherosclerotic heart disease of native coronary artery without angina pectoris: Secondary | ICD-10-CM

## 2018-04-09 LAB — CBC
HEMATOCRIT: 43.1 % (ref 39.0–52.0)
HEMOGLOBIN: 13.6 g/dL (ref 13.0–17.0)
MCH: 28.6 pg (ref 26.0–34.0)
MCHC: 31.6 g/dL (ref 30.0–36.0)
MCV: 90.5 fL (ref 80.0–100.0)
NRBC: 0 % (ref 0.0–0.2)
Platelets: 368 10*3/uL (ref 150–400)
RBC: 4.76 MIL/uL (ref 4.22–5.81)
RDW: 15.7 % — ABNORMAL HIGH (ref 11.5–15.5)
WBC: 13.2 10*3/uL — ABNORMAL HIGH (ref 4.0–10.5)

## 2018-04-09 LAB — BASIC METABOLIC PANEL
Anion gap: 7 (ref 5–15)
BUN: 16 mg/dL (ref 8–23)
CALCIUM: 8.9 mg/dL (ref 8.9–10.3)
CHLORIDE: 101 mmol/L (ref 98–111)
CO2: 26 mmol/L (ref 22–32)
CREATININE: 1.29 mg/dL — AB (ref 0.61–1.24)
GFR calc non Af Amer: 53 mL/min — ABNORMAL LOW (ref >60)
Glucose, Bld: 154 mg/dL — ABNORMAL HIGH (ref 70–99)
Potassium: 5.1 mmol/L (ref 3.5–5.1)
SODIUM: 134 mmol/L — AB (ref 135–145)

## 2018-04-09 LAB — BODY FLUID CELL COUNT WITH DIFFERENTIAL
EOS FL: 7 %
Lymphs, Fluid: 27 %
MONOCYTE-MACROPHAGE-SEROUS FLUID: 1 % — AB (ref 50–90)
Neutrophil Count, Fluid: 65 % — ABNORMAL HIGH (ref 0–25)
Total Nucleated Cell Count, Fluid: 2608 cu mm — ABNORMAL HIGH (ref 0–1000)

## 2018-04-09 LAB — D-DIMER, QUANTITATIVE: D-Dimer, Quant: 2.52 ug/mL-FEU — ABNORMAL HIGH (ref 0.00–0.50)

## 2018-04-09 LAB — LACTATE DEHYDROGENASE, PLEURAL OR PERITONEAL FLUID: LD FL: 132 U/L — AB (ref 3–23)

## 2018-04-09 LAB — GRAM STAIN

## 2018-04-09 LAB — PROCALCITONIN: Procalcitonin: <0.1 ng/mL

## 2018-04-09 LAB — PROTEIN, PLEURAL OR PERITONEAL FLUID: TOTAL PROTEIN, FLUID: 4.3 g/dL

## 2018-04-09 LAB — LACTIC ACID, PLASMA: Lactic Acid, Venous: 1.2 mmol/L (ref 0.5–1.9)

## 2018-04-09 LAB — MRSA PCR SCREENING: MRSA by PCR: NEGATIVE

## 2018-04-09 MED ORDER — PREDNISONE 20 MG PO TABS
40.0000 mg | ORAL_TABLET | Freq: Every day | ORAL | Status: DC
  Administered 2018-04-09: 40 mg via ORAL
  Filled 2018-04-09 (×2): qty 2

## 2018-04-09 MED ORDER — GUAIFENESIN-DM 100-10 MG/5ML PO SYRP
5.0000 mL | ORAL_SOLUTION | ORAL | Status: DC | PRN
  Administered 2018-04-09: 5 mL via ORAL
  Filled 2018-04-09: qty 5

## 2018-04-09 MED ORDER — IPRATROPIUM-ALBUTEROL 0.5-2.5 (3) MG/3ML IN SOLN
3.0000 mL | Freq: Four times a day (QID) | RESPIRATORY_TRACT | Status: DC
  Administered 2018-04-09 – 2018-04-11 (×10): 3 mL via RESPIRATORY_TRACT
  Filled 2018-04-09 (×10): qty 3

## 2018-04-09 MED ORDER — TRAMADOL HCL 50 MG PO TABS
50.0000 mg | ORAL_TABLET | Freq: Four times a day (QID) | ORAL | Status: DC | PRN
  Administered 2018-04-09 (×2): 50 mg via ORAL
  Filled 2018-04-09 (×2): qty 1

## 2018-04-09 MED ORDER — TRAZODONE HCL 50 MG PO TABS
25.0000 mg | ORAL_TABLET | Freq: Every evening | ORAL | Status: DC | PRN
  Administered 2018-04-09 – 2018-04-10 (×2): 50 mg via ORAL
  Filled 2018-04-09 (×2): qty 1

## 2018-04-09 MED ORDER — ESCITALOPRAM OXALATE 10 MG PO TABS
20.0000 mg | ORAL_TABLET | Freq: Every day | ORAL | Status: DC
  Administered 2018-04-09 – 2018-04-11 (×3): 20 mg via ORAL
  Filled 2018-04-09 (×3): qty 2

## 2018-04-09 MED ORDER — FUROSEMIDE 10 MG/ML IJ SOLN
40.0000 mg | Freq: Two times a day (BID) | INTRAMUSCULAR | Status: DC
  Administered 2018-04-09 – 2018-04-10 (×3): 40 mg via INTRAVENOUS
  Filled 2018-04-09 (×3): qty 4

## 2018-04-09 MED ORDER — NORTRIPTYLINE HCL 25 MG PO CAPS
75.0000 mg | ORAL_CAPSULE | Freq: Every day | ORAL | Status: DC
  Administered 2018-04-09 – 2018-04-10 (×3): 75 mg via ORAL
  Filled 2018-04-09 (×4): qty 3

## 2018-04-09 MED ORDER — LEVOTHYROXINE SODIUM 75 MCG PO TABS
150.0000 ug | ORAL_TABLET | Freq: Every day | ORAL | Status: DC
  Administered 2018-04-09 – 2018-04-11 (×3): 150 ug via ORAL
  Filled 2018-04-09 (×4): qty 2

## 2018-04-09 MED ORDER — APIXABAN 5 MG PO TABS
5.0000 mg | ORAL_TABLET | Freq: Two times a day (BID) | ORAL | Status: DC
  Administered 2018-04-09 – 2018-04-11 (×5): 5 mg via ORAL
  Filled 2018-04-09 (×5): qty 1

## 2018-04-09 MED ORDER — DOCUSATE SODIUM 100 MG PO CAPS
100.0000 mg | ORAL_CAPSULE | Freq: Every day | ORAL | Status: DC
  Administered 2018-04-09 – 2018-04-11 (×2): 100 mg via ORAL
  Filled 2018-04-09 (×3): qty 1

## 2018-04-09 MED ORDER — PROPRANOLOL HCL 10 MG PO TABS
10.0000 mg | ORAL_TABLET | Freq: Two times a day (BID) | ORAL | Status: DC
  Administered 2018-04-09 – 2018-04-11 (×5): 10 mg via ORAL
  Filled 2018-04-09 (×5): qty 1

## 2018-04-09 MED ORDER — LIDOCAINE HCL (PF) 1 % IJ SOLN
INTRAMUSCULAR | Status: AC
  Filled 2018-04-09: qty 30

## 2018-04-09 MED ORDER — ADULT MULTIVITAMIN W/MINERALS CH
1.0000 | ORAL_TABLET | Freq: Every day | ORAL | Status: DC
  Administered 2018-04-09 – 2018-04-11 (×3): 1 via ORAL
  Filled 2018-04-09 (×3): qty 1

## 2018-04-09 MED ORDER — ALLOPURINOL 100 MG PO TABS
100.0000 mg | ORAL_TABLET | Freq: Every day | ORAL | Status: DC
  Administered 2018-04-09 – 2018-04-11 (×3): 100 mg via ORAL
  Filled 2018-04-09 (×3): qty 1

## 2018-04-09 MED ORDER — POLYETHYLENE GLYCOL 3350 17 G PO PACK
17.0000 g | PACK | Freq: Every day | ORAL | Status: DC
  Filled 2018-04-09 (×3): qty 1

## 2018-04-09 MED ORDER — CYCLOBENZAPRINE HCL 10 MG PO TABS
10.0000 mg | ORAL_TABLET | Freq: Three times a day (TID) | ORAL | Status: DC | PRN
  Administered 2018-04-09: 10 mg via ORAL
  Filled 2018-04-09: qty 1

## 2018-04-09 NOTE — Procedures (Signed)
PROCEDURE SUMMARY:  Successful image-guided left thoracentesis. Yielded 1.0 liters of yellow fluid. Patient tolerated procedure well. No immediate complications.  Specimen was sent for labs.  Post procedure CXR shows no pneumothorax.  Joaquim Nam PA-C 04/09/2018 2:17 PM

## 2018-04-09 NOTE — Progress Notes (Signed)
PROGRESS NOTE    Wayne Booth  XYB:[id number] DOB: [birth year] DOA: 04/08/2018 PCP: Marin Olp, MD    Brief Narrative:  77 year old male who presented with dyspnea on exertion.  He does have significant past medical history for atrial fibrillation, coronary artery disease, hypertension, hypothyroidism, prostate cancer and chronic kidney disease stage III.  Patient was recently discharged from inpatient rehab after suffering MRSA pneumonia, February 26, 2018.  At home patient to need to have dyspnea, but for last few days prior to dictation he had worsening symptoms, mainly on exertion associated with chest tightness.  On his initial physical examination blood pressure 135/78, heart rate 87, respiratory rate  27, temperature is 7.8, oxygen saturation 93%.  He had diffuse expiratory wheezing, left lower lobe rales, heart S1-S2 present and rhythmic, abdomen soft nontender, no lower extremity edema.  Sodium 133, potassium 3.7, chloride 98, bicarb 25, glucose 114, BUN 17, creatinine 1.45, BNP 62.9, white count 12.2, hemoglobin 14.0, hematocrit 45.2, platelets 399, procalcitonin less than 0.10.  Urinalysis negative for infection.  Chest x-ray with left pleural effusion, increased vascular congestion.  EKG sinus rhythm, normal axis, anteroseptal Q waves (chronic).   Patient was admitted to the hospital with working diagnosis of hypoxemic respiratory failure due to cardiogenic pulmonary edema complicated by bilateral pleural effusions.  Assessment & Plan:   Principal Problem:   Acute hypoxemic respiratory failure (HCC) Active Problems:   OSA (obstructive sleep apnea)   Hypothyroidism   Gout   Atrial flutter (HCC)   Chronic kidney disease (CKD), stage III (moderate) (HCC)   CAD (coronary artery disease)   Depression   Chronic pain   1. Acute hypoxic respiratory failure due to pulmonary edema and left pleural effusion, related to diastolic heart failure exacerbation. Will continue diuresis  with IV furosemide to target a negative fluid balance, ordered US guided thoracentesis on the left, suspected transudate, follow on fluid analysis. Chest film not consistent with infection, will hold on antibiotic therapy for now. Continue oxymetry monitoring and supplemental 02 per [place] to target 02 saturation greater than 92%. Continue propranolol. Old records personally reviewed, echocardiogram from 01/2018 with preserved LV systolic function 50 to 66%. Discontinue prednisone.   2. Paroxysmal atrial flutter. Will continue rate control with propranolol, anticoagulation with apixaban. Continue telemetry monitoring.   3. CKD stage 3. Renal function with serum cr at 1,45 with K at 3,7, will follow on renal panel today, strict in an out. Avoid hypotension and nephrotoxic medications.   4. Hypothyroid. Continue levothyroxine per home regimen.   5. COPD. Stable with no signs of exacerbation, continue as needed bronchodilators and antitussive therapy.   6. Depression/ anxiety. At home on escitalopram and nortriptyline     DVT prophylaxis: apixaban   Code Status:  full Family Communication: no family at the bedside  Disposition Plan/ discharge barriers: pending clinical improvement    Consultants:     Procedures:     Antimicrobials:       Subjective: Patient continue to have dyspnea, associated with cough, and orthopnea. No chest pain or palpitations, no nausea or vomiting, positive lower extremity edema.   Objective: Vitals:   04/09/18 0453 04/09/18 0526 04/09/18 0740 04/09/18 0847  BP: (!) 143/82  134/80   Pulse: 95  69   Resp: (!) 27  (!) 27   Temp: 98.4 F (36.9 C)  97.8 F (36.6 C)   TempSrc: Oral  Oral   SpO2: 93% 94% 94% 99%  Weight:  Height:        Intake/Output Summary (Last 24 hours) at 04/09/2018 0919 Last data filed at 04/09/2018 0800 Gross per 24 hour  Intake 1010 ml  Output 725 ml  Net 285 ml   Filed Weights   04/08/18 2121 04/09/18 0058  Weight:  122.9 kg 124.2 kg    Examination:   General: deconditioned  Neurology: Awake and alert, non focal  E ENT: mild pallor, no icterus, oral mucosa moist Cardiovascular: No JVD. S1-S2 present, rhythmic, no gallops, rubs, or murmurs. +/++ pitting lower extremity edema. Pulmonary: decreased breath sounds at the left base, dull to percussion, rales on the right, no wheezing.  Gastrointestinal. Abdomen protuberant with no organomegaly, non tender, no rebound or guarding Skin. No rashes Musculoskeletal: no joint deformities     Data Reviewed: I have personally reviewed following labs and imaging studies  CBC: Recent Labs  Lab 04/08/18 2130 04/09/18 0247  WBC 12.2* 13.2*  HGB 14.0 13.6  HCT 45.2 43.1  MCV 91.7 90.5  PLT 399 010   Basic Metabolic Panel: Recent Labs  Lab 04/08/18 2130  NA 133*  K 3.7  CL 98  CO2 25  GLUCOSE 114*  BUN 17  CREATININE 1.45*  CALCIUM 8.8*   GFR: Estimated Creatinine Clearance: 59.1 mL/min (A) (by C-G formula based on SCr of 1.45 mg/dL (H)). Liver Function Tests: Recent Labs  Lab 04/08/18 2130  AST 21  ALT 18  ALKPHOS 60  BILITOT 0.5  PROT 6.5  ALBUMIN 3.1*   Recent Labs  Lab 04/08/18 2130  LIPASE 42   No results for input(s): AMMONIA in the last 168 hours. Coagulation Profile: No results for input(s): INR, PROTIME in the last 168 hours. Cardiac Enzymes: No results for input(s): CKTOTAL, CKMB, CKMBINDEX, TROPONINI in the last 168 hours. BNP (last 3 results) No results for input(s): PROBNP in the last 8760 hours. HbA1C: No results for input(s): HGBA1C in the last 72 hours. CBG: No results for input(s): GLUCAP in the last 168 hours. Lipid Profile: No results for input(s): CHOL, HDL, LDLCALC, TRIG, CHOLHDL, LDLDIRECT in the last 72 hours. Thyroid Function Tests: No results for input(s): TSH, T4TOTAL, FREET4, T3FREE, THYROIDAB in the last 72 hours. Anemia Panel: No results for input(s): VITAMINB12, FOLATE, FERRITIN, TIBC, IRON,  RETICCTPCT in the last 72 hours.    Radiology Studies: I have reviewed all of the imaging during this hospital visit personally     Scheduled Meds: . allopurinol  100 mg Oral Daily  . apixaban  5 mg Oral BID  . docusate sodium  100 mg Oral Daily  . escitalopram  20 mg Oral Daily  . ipratropium-albuterol  3 mL Nebulization Q6H  . levothyroxine  150 mcg Oral QAC breakfast  . multivitamin with minerals  1 tablet Oral Daily  . nortriptyline  75 mg Oral QHS  . polyethylene glycol  17 g Oral Daily  . predniSONE  40 mg Oral Q breakfast  . propranolol  10 mg Oral BID   Continuous Infusions:   LOS: 1 day        Jeno Calleros Gerome Apley, MD [facility] Pager [phone]

## 2018-04-09 NOTE — H&P (Signed)
History and Physical    Wayne Booth UYQ:[id number] DOB: 1940-10-17 DOA: 04/08/2018  PCP: Marin Olp, MD Patient coming from: Home  Chief Complaint: Dyspnea on exertion  HPI: Wayne Booth is a 77 y.o. male with medical history significant of atrial fibrillation, coronary artery disease, hypertension, hypothyroidism, OSA, prostate cancer, CKD 3 presenting to the hospital with a chief complaint of dyspnea on exertion.  Patient states he has been having dyspnea with exertion since his previous hospital discharge in September.  Denies having any cough.  Reports feeling weak, tired, and having a low-grade fever at home.  States he had chest tightness with walking a few days ago.  The chest tightness resolved in a few minutes with rest.  No chest pain or chest tightness at present.  Denies history of blood clots.  Nuys having calf pain, erythema, or swelling.  Patient was admitted at [facility] from February 02, 2018 to February 26, 2018 for sepsis and acute hypoxemic respiratory failure secondary to MRSA pneumonia.  [facility]  ED Course: Afebrile.  Tachypneic on arrival.  [place] well on room air, however, O2 sats dropped as low as 88% with ambulation.  White count 12.2.  I-STAT troponin negative.  EKG not suggestive of ACS.  BNP 62.  Chest x-ray showing bilateral pleural effusions with bibasilar atelectasis or consolidation, greater on the left.  Patient received vancomycin and cefepime in the ED.  TRH paged to admit.  Review of Systems: As per HPI otherwise 10 point review of systems negative.  Past Medical History:  Diagnosis Date  . Allergy    States his nose runs when he is around grease.   Marland Kitchen Anxiety   . Arthritis   . Atrial fibrillation ([place])   . Cancer ([place])   . Coronary artery disease   . History of total knee replacement   . Hypertension   . Hypothyroidism   . Kidney cancer, primary, with metastasis from kidney to other site [facility])   . Kidney  stone   . OSA (obstructive sleep apnea)    pt does not wear cpap at night  . Pituitary cyst ([place])   . Pneumonia    hx of  . Prostate cancer [facility])     Past Surgical History:  Procedure Laterality Date  . ABLATION OF DYSRHYTHMIC FOCUS  01/28/2018  . ANTERIOR CERVICAL DECOMP/DISCECTOMY FUSION N/A 08/29/2012   Procedure: ANTERIOR CERVICAL DECOMPRESSION/DISCECTOMY FUSION 1 LEVEL;  Surgeon: Eustace Moore, MD;  Location: [place] [facility];  Service: Neurosurgery;  Laterality: N/A;  . APPENDECTOMY    . ATRIAL FIBRILLATION ABLATION N/A 01/28/2018   Procedure: ATRIAL FIBRILLATION ABLATION;  Surgeon: Constance Haw, MD;  Location: [place] CV LAB;  Service: Cardiovascular;  Laterality: N/A;  . CARDIAC CATHETERIZATION  2008   clean  . CHOLECYSTECTOMY    . COLONOSCOPY W/ POLYPECTOMY    . CRANIOTOMY N/A 11/08/2012   Procedure: CRANIOTOMY HYPOPHYSECTOMY TRANSNASAL APPROACH;  Surgeon: Faythe Ghee, MD;  Location: [place] [facility];  Service: Neurosurgery;  Laterality: N/A;  Transphenoidal Resection of Pituitary Tumor  . FINGER SURGERY Left 2017   Dr Amedeo Plenty  . INSERTION PROSTATE RADIATION SEED    . KIDNEY STONE SURGERY    . KNEE ARTHROSCOPY  2007   left  . NEPHRECTOMY Left   . POSTERIOR CERVICAL LAMINECTOMY Left 04/24/2015   Procedure: Foraminotomy cervical five - cervical six cervical six - cervical seven left;  Surgeon: Eustace Moore, MD;  Location:  [place] NEURO ORS;  Service: Neurosurgery;  Laterality: Left;  . REPLACEMENT TOTAL KNEE BILATERAL       reports that he has quit smoking. His smoking use included cigars. He quit after 4.00 years of use. He has never used smokeless tobacco. He reports that he drinks about 2.0 standard drinks of alcohol per week. He reports that he does not use drugs.  Allergies  Allergen Reactions  . Ambien [Zolpidem] Anxiety    Anxiety and agitation when tried as sleeper in hospital aug 2019    Family History  Problem Relation Age of Onset  . Cancer Father         ?mouth, died before patient born  . Cancer Mother        stomach, died when he was young  . Kidney cancer Sister   . Colon cancer Brother 78  . Esophageal cancer Neg Hx   . Rectal cancer Neg Hx   . Stomach cancer Neg Hx     Prior to Admission medications   Medication Sig Start Date End Date Taking? Authorizing Provider  albuterol (PROVENTIL HFA;VENTOLIN HFA) 108 (90 Base) MCG/ACT inhaler Inhale 2 puffs into the lungs every 6 (six) hours as needed for wheezing or shortness of breath. 03/04/18  Yes Angiulli, Lavon Paganini, PA-C  allopurinol (ZYLOPRIM) 100 MG tablet Take 1 tablet (100 mg total) by mouth daily. 03/08/18  Yes Marin Olp, MD  apixaban (ELIQUIS) 5 MG TABS tablet Take 1 tablet (5 mg total) by mouth 2 (two) times daily. 03/08/18  Yes Marin Olp, MD  budesonide-formoterol [facility]) 160-4.5 MCG/ACT inhaler Inhale 2 puffs into the lungs 2 (two) times daily. 03/09/18  Yes Marin Olp, MD  colchicine 0.6 MG tablet Take 1 tablet (0.6 mg total) by mouth 2 (two) times daily. 03/04/18  Yes Angiulli, Lavon Paganini, PA-C  cyclobenzaprine (FLEXERIL) 10 MG tablet Take 1 tablet (10 mg total) by mouth 3 (three) times daily as needed for muscle spasms. 03/04/18  Yes Angiulli, Lavon Paganini, PA-C  docusate sodium (COLACE) 100 MG capsule Take 1 capsule (100 mg total) by mouth daily. 02/26/18  Yes Isaac Bliss, Rayford Halsted, MD  escitalopram (LEXAPRO) 20 MG tablet Take 1 tablet (20 mg total) by mouth daily. 03/04/18  Yes Angiulli, Lavon Paganini, PA-C  furosemide (LASIX) 20 MG tablet TAKE ONE TABLET BY MOUTH DAILY AS NEEDED 03/21/18  Yes Marin Olp, MD  levothyroxine (SYNTHROID, LEVOTHROID) 150 MCG tablet Take 1 tablet (150 mcg total) by mouth daily. 03/04/18  Yes Angiulli, Lavon Paganini, PA-C  Multiple Vitamins-Minerals (MULTIVITAMINS THER. W/MINERALS) TABS Take 1 tablet by mouth daily.   Yes [provider]  nortriptyline (PAMELOR) 75 MG capsule Take 1 capsule (75 mg total) by mouth at bedtime. 03/04/18   Yes Angiulli, Lavon Paganini, PA-C  polyethylene glycol (MIRALAX / GLYCOLAX) packet Take 17 g by mouth daily. 03/04/18  Yes Angiulli, Lavon Paganini, PA-C  propranolol (INDERAL) 10 MG tablet TAKE 1 TABLET (10 MG TOTAL) BY MOUTH 2 (TWO) TIMES DAILY. 03/17/18  Yes Marin Olp, MD  traMADol (ULTRAM) 50 MG tablet Take 1 tablet (50 mg total) by mouth every 6 (six) hours as needed for moderate pain. 03/08/18  Yes Marin Olp, MD  traZODone (DESYREL) 50 MG tablet TAKE ONE HALF TO ONE TABLET BY MOUTH EVERY NIGHT AT BEDTIME AS NEEDED FOR SLEEP 03/29/18  Yes Marin Olp, MD    Physical Exam: Vitals:   04/09/18 4481 04/09/18 0300 04/09/18 8563 04/09/18 1497  BP: 135/78  (!) 143/82   Pulse: 87 88 95   Resp: (!) 25 (!) 23 (!) 27   Temp: 97.8 F (36.6 C)  98.4 F (36.9 C)   TempSrc: Oral  Oral   SpO2: 97% 93% 93% 94%  Weight: 124.2 kg     Height: 5\' 11"  (1.803 m)      Physical Exam  Constitutional: He is oriented to person, place, and time. He appears well-developed and well-nourished. No distress.  HENT:  Head: Normocephalic and atraumatic.  Mouth/Throat: Oropharynx is clear and moist.  Eyes: Right eye exhibits no discharge. Left eye exhibits no discharge.  Neck: Neck supple. No tracheal deviation present.  Cardiovascular: Normal rate, regular rhythm and intact distal pulses.  Pulmonary/Chest: Effort normal.  Mild diffuse and expiratory wheezing Left lower lobe rales appreciated Speaking comfortably in full sentences No increased work of breathing  Abdominal: Soft. Bowel sounds are normal. He exhibits no distension. There is no tenderness.  Musculoskeletal: He exhibits no edema.  Neurological: He is alert and oriented to person, place, and time.  Skin: Skin is warm and dry.  Psychiatric: He has a normal mood and affect. His behavior is normal.     Labs on Admission: I have personally reviewed following labs and imaging studies  CBC: Recent Labs  Lab 04/08/18 2130 04/09/18 0247    WBC 12.2* 13.2*  HGB 14.0 13.6  HCT 45.2 43.1  MCV 91.7 90.5  PLT 399 283   Basic Metabolic Panel: Recent Labs  Lab 04/08/18 2130  NA 133*  K 3.7  CL 98  CO2 25  GLUCOSE 114*  BUN 17  CREATININE 1.45*  CALCIUM 8.8*   GFR: Estimated Creatinine Clearance: 59.1 mL/min (A) (by C-G formula based on SCr of 1.45 mg/dL (H)). Liver Function Tests: Recent Labs  Lab 04/08/18 2130  AST 21  ALT 18  ALKPHOS 60  BILITOT 0.5  PROT 6.5  ALBUMIN 3.1*   Recent Labs  Lab 04/08/18 2130  LIPASE 42   No results for input(s): AMMONIA in the last 168 hours. Coagulation Profile: No results for input(s): INR, PROTIME in the last 168 hours. Cardiac Enzymes: No results for input(s): CKTOTAL, CKMB, CKMBINDEX, TROPONINI in the last 168 hours. BNP (last 3 results) No results for input(s): PROBNP in the last 8760 hours. HbA1C: No results for input(s): HGBA1C in the last 72 hours. CBG: No results for input(s): GLUCAP in the last 168 hours. Lipid Profile: No results for input(s): CHOL, HDL, LDLCALC, TRIG, CHOLHDL, LDLDIRECT in the last 72 hours. Thyroid Function Tests: No results for input(s): TSH, T4TOTAL, FREET4, T3FREE, THYROIDAB in the last 72 hours. Anemia Panel: No results for input(s): VITAMINB12, FOLATE, FERRITIN, TIBC, IRON, RETICCTPCT in the last 72 hours. Urine analysis:    Component Value Date/Time   COLORURINE YELLOW 04/08/2018 2126   APPEARANCEUR CLEAR 04/08/2018 2126   LABSPEC 1.015 04/08/2018 2126   PHURINE 5.0 04/08/2018 2126   GLUCOSEU NEGATIVE 04/08/2018 2126   HGBUR NEGATIVE 04/08/2018 2126   HGBUR 2+ 09/22/2007 1444   BILIRUBINUR NEGATIVE 04/08/2018 2126   BILIRUBINUR n 11/07/2015 [place] 04/08/2018 2126   PROTEINUR NEGATIVE 04/08/2018 2126   UROBILINOGEN 1.0 11/07/2015 1417   UROBILINOGEN 1.0 05/22/2011 1454   NITRITE NEGATIVE 04/08/2018 2126   LEUKOCYTESUR NEGATIVE 04/08/2018 2126    Radiological Exams on Admission: Dg Chest 2  View  Result Date: 04/08/2018 CLINICAL DATA:  Shortness of breath EXAM: CHEST - 2 VIEW COMPARISON:  02/22/2018 FINDINGS:  Cardiac enlargement. Pulmonary vascularity is normal. Moderate left and small right pleural effusions. Basilar atelectasis or consolidation more prominent on the left. This is progressing since previous study. No pneumothorax. Calcification of the aorta. Degenerative changes in the spine. IMPRESSION: Bilateral pleural effusions with basilar atelectasis or consolidation, greater on the left. Progression since previous study. Electronically Signed   By: Lucienne Capers M.D.   On: 04/08/2018 22:17    EKG: Independently reviewed.  Sinus rhythm; cannot rule out anteroseptal infarct.  Similar to prior tracing.  Assessment/Plan Principal Problem:   Acute hypoxemic respiratory failure (HCC) Active Problems:   OSA (obstructive sleep apnea)   Hypothyroidism   Gout   Atrial flutter (HCC)   Chronic kidney disease (CKD), stage III (moderate) (HCC)   CAD (coronary artery disease)   Depression   Chronic pain   Acute hypoxemic respiratory failure  -Unclear etiology? Restrictive airway disease. Mild end expiratory wheezing appreciated in exam.  -Afebrile. Satting well on room air, however, O2 sats dropped as low as 88% with ambulation.  White count 12.2.  Lactate normal.  Chest x-ray showing bilateral pleural effusions with bibasilar atelectasis or consolidation, greater on the left.  Patient received  Vanc and cefepime for HAP coverage.  However, procalcitonin came back negative. -Discontinue antibiotics -PFTs done in February 2019 showing moderate restriction, DLCO 67.   -Ambulate patient and repeat O2 sats in the morning.  If still hypoxic, consider ordering chest CT for further evaluation. -PE also a possibility, although patient not tachycardic and is on Eliquis for A. fib. Check d-dimer. -Incentive spirometry -Duo nebs every 6 hours -Prednisone 40 mg daily -Repeat CBC in  a.m. -Continuous pulse ox monitoring  CKD stage III -GFR 46.  Serum creatinine 1.4; baseline 1.3-1.4 a month ago. -Stable.  Atrial flutter -CHA2DS2VASc 7.  Continue Eliquis for anticoagulation.  Continue home propranolol.  Hx of CAD -History suggestive of stable angina. -Continue home propranolol. -I-STAT troponin negative and EKG not suggestive of ACS. -He will need outpatient ischemic evaluation.  Gout -Continue home allopurinol  Depression -Continue home Lexapro -Continue home nortriptyline  Hypothyroidism -Continue home Synthroid  Chronic pain -Continue home Flexeril, tramadol  OSA -Patient states he no longer uses CPAP after losing a significant amount of weight. -He will need repeat sleep study as outpatient   DVT prophylaxis: Eliquis Code Status: Patient wishes to be full code. Family Communication: No family present at bedside. Disposition Plan: Anticipate discharge to home in 1 to 2 days. Consults called: None Admission status: Inpatient It is my clinical opinion that admission to INPATIENT is reasonable and necessary in this 77 y.o. male . presenting with symptoms of dyspnea on exertion, concerning for acute hypoxemic respiratory failure . in the context of PMH including: Restrictive airway disease . with pertinent positives on physical exam including: Oxygen desaturation with ambulation . and pertinent positives on radiographic and laboratory data including: Chest x-ray showing bilateral pleural effusions with bibasilar atelectasis or consolidation, greater on the left.  . Workup and treatment include nebulizer treatments, steroid, possible chest CT for further work-up  Given the aforementioned, the predictability of an adverse outcome is felt to be significant. I expect that the patient will require at least 2 midnights in the hospital to treat this condition.    Shela Leff MD [facility] Pager [phone]  If 7PM-7AM, please contact  night-coverage [email or web address] Password [email or web address]  04/09/2018, 5:45 AM

## 2018-04-09 NOTE — Progress Notes (Signed)
ANTICOAGULATION CONSULT NOTE - Initial Consult  Pharmacy Consult for Eliquis Indication: atrial fibrillation  Allergies  Allergen Reactions  . Ambien [Zolpidem] Anxiety    Anxiety and agitation when tried as sleeper in hospital aug 2019    Patient Measurements: Height: 5\' 11"  (180.3 cm) Weight: 273 lb 13 oz (124.2 kg) IBW/kg (Calculated) : 75.3  Vital Signs: Temp: 97.8 F (36.6 C) (10/12 0058) Temp Source: Oral (10/12 0058) BP: 135/78 (10/12 0058) Pulse Rate: 87 (10/12 0058)  Labs: Recent Labs    04/08/18 2130  HGB 14.0  HCT 45.2  PLT 399  CREATININE 1.45*    Estimated Creatinine Clearance: 59.1 mL/min (A) (by C-G formula based on SCr of 1.45 mg/dL (H)).   Medical History: Past Medical History:  Diagnosis Date  . Allergy    States his nose runs when he is around grease.   Marland Kitchen Anxiety   . Arthritis   . Atrial fibrillation ([place])   . Cancer ([place])   . Coronary artery disease   . History of total knee replacement   . Hypertension   . Hypothyroidism   . Kidney cancer, primary, with metastasis from kidney to other site [facility])   . Kidney stone   . OSA (obstructive sleep apnea)    pt does not wear cpap at night  . Pituitary cyst ([place])   . Pneumonia    hx of  . Prostate cancer ([place])     Medications:  No current facility-administered medications on file prior to encounter.    Current Outpatient Medications on File Prior to Encounter  Medication Sig Dispense Refill  . albuterol (PROVENTIL HFA;VENTOLIN HFA) 108 (90 Base) MCG/ACT inhaler Inhale 2 puffs into the lungs every 6 (six) hours as needed for wheezing or shortness of breath. 1 Inhaler 5  . allopurinol (ZYLOPRIM) 100 MG tablet Take 1 tablet (100 mg total) by mouth daily. 30 tablet 6  . apixaban (ELIQUIS) 5 MG TABS tablet Take 1 tablet (5 mg total) by mouth 2 (two) times daily. 60 tablet 11  . budesonide-formoterol (SYMBICORT) 160-4.5 MCG/ACT inhaler Inhale 2 puffs into the lungs 2 (two) times daily. 1 Inhaler  5  . colchicine 0.6 MG tablet Take 1 tablet (0.6 mg total) by mouth 2 (two) times daily. 60 tablet 0  . cyclobenzaprine (FLEXERIL) 10 MG tablet Take 1 tablet (10 mg total) by mouth 3 (three) times daily as needed for muscle spasms. 30 tablet 0  . docusate sodium (COLACE) 100 MG capsule Take 1 capsule (100 mg total) by mouth daily. 10 capsule 0  . escitalopram (LEXAPRO) 20 MG tablet Take 1 tablet (20 mg total) by mouth daily. 90 tablet 3  . furosemide (LASIX) 20 MG tablet TAKE ONE TABLET BY MOUTH DAILY AS NEEDED 90 tablet 2  . levothyroxine (SYNTHROID, LEVOTHROID) 150 MCG tablet Take 1 tablet (150 mcg total) by mouth daily. 90 tablet 2  . Multiple Vitamins-Minerals (MULTIVITAMINS THER. W/MINERALS) TABS Take 1 tablet by mouth daily.    . nortriptyline (PAMELOR) 75 MG capsule Take 1 capsule (75 mg total) by mouth at bedtime. 90 capsule 1  . polyethylene glycol (MIRALAX / GLYCOLAX) packet Take 17 g by mouth daily. 14 each 0  . propranolol (INDERAL) 10 MG tablet TAKE 1 TABLET (10 MG TOTAL) BY MOUTH 2 (TWO) TIMES DAILY. 60 tablet 3  . traMADol (ULTRAM) 50 MG tablet Take 1 tablet (50 mg total) by mouth every 6 (six) hours as needed for moderate pain. 30 tablet 2  . traZODone (  DESYREL) 50 MG tablet TAKE ONE HALF TO ONE TABLET BY MOUTH EVERY NIGHT AT BEDTIME AS NEEDED FOR SLEEP 90 tablet 1     Assessment: 77 y.o. male admitted with PNA, h/o Afib, to continue Eliquis   Plan:  Eliquis 5 mg BID  Wayne Booth, [place] Curb 04/09/2018,1:40 AM

## 2018-04-09 NOTE — Progress Notes (Addendum)
Pt recently back from thoracentesis, Yielded 1.0 Liter. Pt called RN complaning of sharp pain on Left side, radiating to his back, 8/10 pain. BP 134/70, sats 96% on 2L [place]. MD notified. No new orders at the moment.    1402: Order received from MD to give tramadol for pain. Pt feels better after 30 min but still cp of spasm/pain in the chest. VS stable. Will continue to monitor pt.

## 2018-04-10 ENCOUNTER — Inpatient Hospital Stay (HOSPITAL_COMMUNITY): Payer: Medicare Other

## 2018-04-10 DIAGNOSIS — J90 Pleural effusion, not elsewhere classified: Secondary | ICD-10-CM

## 2018-04-10 DIAGNOSIS — R0902 Hypoxemia: Secondary | ICD-10-CM

## 2018-04-10 LAB — CBC WITH DIFFERENTIAL/PLATELET
Abs Immature Granulocytes: 0.1 10*3/uL — ABNORMAL HIGH (ref 0.00–0.07)
BASOS ABS: 0.1 10*3/uL (ref 0.0–0.1)
Basophils Relative: 0 %
Eosinophils Absolute: 0.2 10*3/uL (ref 0.0–0.5)
Eosinophils Relative: 1 %
HEMATOCRIT: 41.9 % (ref 39.0–52.0)
Hemoglobin: 13.6 g/dL (ref 13.0–17.0)
IMMATURE GRANULOCYTES: 1 %
LYMPHS ABS: 1.5 10*3/uL (ref 0.7–4.0)
LYMPHS PCT: 10 %
MCH: 28.9 pg (ref 26.0–34.0)
MCHC: 32.5 g/dL (ref 30.0–36.0)
MCV: 89.1 fL (ref 80.0–100.0)
Monocytes Absolute: 1.2 10*3/uL — ABNORMAL HIGH (ref 0.1–1.0)
Monocytes Relative: 8 %
NRBC: 0 % (ref 0.0–0.2)
Neutro Abs: 12.5 10*3/uL — ABNORMAL HIGH (ref 1.7–7.7)
Neutrophils Relative %: 80 %
Platelets: 375 10*3/uL (ref 150–400)
RBC: 4.7 MIL/uL (ref 4.22–5.81)
RDW: 15.7 % — AB (ref 11.5–15.5)
WBC: 15.4 10*3/uL — ABNORMAL HIGH (ref 4.0–10.5)

## 2018-04-10 LAB — BASIC METABOLIC PANEL
ANION GAP: 11 (ref 5–15)
BUN: 17 mg/dL (ref 8–23)
CALCIUM: 8.8 mg/dL — AB (ref 8.9–10.3)
CO2: 22 mmol/L (ref 22–32)
Chloride: 100 mmol/L (ref 98–111)
Creatinine, Ser: 1.35 mg/dL — ABNORMAL HIGH (ref 0.61–1.24)
GFR calc non Af Amer: 50 mL/min — ABNORMAL LOW (ref >60)
GFR, EST AFRICAN AMERICAN: 58 mL/min — AB (ref >60)
Glucose, Bld: 153 mg/dL — ABNORMAL HIGH (ref 70–99)
Potassium: 3.9 mmol/L (ref 3.5–5.1)
SODIUM: 133 mmol/L — AB (ref 135–145)

## 2018-04-10 LAB — LACTATE DEHYDROGENASE: LDH: 189 U/L (ref 98–192)

## 2018-04-10 MED ORDER — FUROSEMIDE 10 MG/ML IJ SOLN
40.0000 mg | Freq: Every day | INTRAMUSCULAR | Status: DC
  Administered 2018-04-11: 40 mg via INTRAVENOUS

## 2018-04-10 NOTE — Progress Notes (Signed)
Patient bathing at sink when he called me into his room to assess a soft swollen area to the right of his neck.  Area is approximately 3-4 cm in diameter.  No crepitus noted.  No obstruction of airway assessed.  Patient quite concerned.  MD notified to assess.

## 2018-04-10 NOTE — Progress Notes (Signed)
PROGRESS NOTE    Wayne Booth  OAC:[id number] DOB: 09-15-1940 DOA: 04/08/2018 PCP: Marin Olp, MD    Brief Narrative:  77 year old male who presented with dyspnea on exertion.  He does have significant past medical history for atrial fibrillation, coronary artery disease, hypertension, hypothyroidism, prostate cancer and chronic kidney disease stage III.  Patient was recently discharged from inpatient rehab after suffering MRSA pneumonia, February 26, 2018.  At home patient to need to have dyspnea, but for last few days prior to dictation he had worsening symptoms, mainly on exertion associated with chest tightness.  On his initial physical examination blood pressure 135/78, heart rate 87, respiratory rate  27, temperature is 7.8, oxygen saturation 93%.  He had diffuse expiratory wheezing, left lower lobe rales, heart S1-S2 present and rhythmic, abdomen soft nontender, no lower extremity edema.  Sodium 133, potassium 3.7, chloride 98, bicarb 25, glucose 114, BUN 17, creatinine 1.45, BNP 62.9, white count 12.2, hemoglobin 14.0, hematocrit 45.2, platelets 399, procalcitonin less than 0.10.  Urinalysis negative for infection.  Chest x-ray with left pleural effusion, increased vascular congestion.  EKG sinus rhythm, normal axis, anteroseptal Q waves (chronic).   Patient was admitted to the hospital with working diagnosis of hypoxemic respiratory failure due to cardiogenic pulmonary edema complicated by bilateral pleural effusions.   Assessment & Plan:   Principal Problem:   Acute hypoxemic respiratory failure (HCC) Active Problems:   OSA (obstructive sleep apnea)   Hypothyroidism   Gout   Atrial flutter (HCC)   Chronic kidney disease (CKD), stage III (moderate) (HCC)   CAD (coronary artery disease)   Depression   Chronic pain   1. Acute hypoxic respiratory failure due to pulmonary edema and left pleural effusion, related to diastolic heart failure exacerbation. Echocardiogram from  01/2018 with preserved LV systolic function 50 to 01%. Improving symptoms with diuresis and thoracentesis, but continue to be volume overloaded, continue furosemide IV, to target negative fluid balance, urine output over last 24 hours 2,225 ml, and 1000 ml removed from left pleural space, chest film this am with improvement in infiltrates. Patient on propranolol. Out of bed and ambulation, physical therapy evaluation.   2. Paroxysmal atrial flutter. Patient had ablation in the past, continue to be sinus rhythm, he has been placed on propranolol for essential tremors (old records personally reviewed). Continue telemetry monitoring. Anticoagulation with apixaban.   3. CKD stage 3. Stable renal function with serum cr at 1.35 from 1,29, K at 3,9 and serum bicarbonate at 22. Will continue to follow on up renal function in am, avoid hypotension or nephrotoxic medications, continue IV furosemide.    4. Hypothyroid. On levothyroxine.   5. COPD. No current signs of exacerbation. On bronchodilators and antitussive therapy as needed.   6. Depression/ anxiety. On escitalopram and nortriptyline     DVT prophylaxis: apixaban   Code Status:  full Family Communication: no family at the bedside  Disposition Plan/ discharge barriers: pending clinical improvement    Consultants:     Procedures:     Antimicrobials:      Subjective: Dyspnea has improved after thoracentesis and diuresis but not back to his baseline yet, had intermittent chest pain, last night improved with analgesics, moderate in intensity, with no radiation or associated symptoms.   Objective: Vitals:   04/10/18 0123 04/10/18 0321 04/10/18 0500 04/10/18 0752  BP:  119/74    Pulse:  78    Resp:  20    Temp:  97.6 F (36.4 C)  TempSrc:  Oral    SpO2: 95% 94%  99%  Weight:   122.5 kg   Height:        Intake/Output Summary (Last 24 hours) at 04/10/2018 0823 Last data filed at 04/10/2018 0400 Gross per 24  hour  Intake 480 ml  Output 2225 ml  Net -1745 ml   Filed Weights   04/08/18 2121 04/09/18 0058 04/10/18 0500  Weight: 122.9 kg 124.2 kg 122.5 kg    Examination:   General: deconditioned  Neurology: Awake and alert, non focal  E ENT: mild pallor, no icterus, oral mucosa moist Cardiovascular: No JVD. S1-S2 present, rhythmic, no gallops, rubs, or murmurs. Trace lower extremity edema. Pulmonary: positive breath sounds bilaterally, no wheezing, or rhonchi positive bilateral rales. Gastrointestinal. Abdomen protuberant with no organomegaly, non tender, no rebound or guarding Skin. No rashes Musculoskeletal: no joint deformities     Data Reviewed: I have personally reviewed following labs and imaging studies  CBC: Recent Labs  Lab 04/08/18 2130 04/09/18 0247 04/10/18 0202  WBC 12.2* 13.2* 15.4*  NEUTROABS  --   --  12.5*  HGB 14.0 13.6 13.6  HCT 45.2 43.1 41.9  MCV 91.7 90.5 89.1  PLT 399 368 540   Basic Metabolic Panel: Recent Labs  Lab 04/08/18 2130 04/09/18 1532 04/10/18 0202  NA 133* 134* 133*  K 3.7 5.1 3.9  CL 98 101 100  CO2 25 26 22   GLUCOSE 114* 154* 153*  BUN 17 16 17   CREATININE 1.45* 1.29* 1.35*  CALCIUM 8.8* 8.9 8.8*   GFR: Estimated Creatinine Clearance: 63 mL/min (A) (by C-G formula based on SCr of 1.35 mg/dL (H)). Liver Function Tests: Recent Labs  Lab 04/08/18 2130  AST 21  ALT 18  ALKPHOS 60  BILITOT 0.5  PROT 6.5  ALBUMIN 3.1*   Recent Labs  Lab 04/08/18 2130  LIPASE 42   No results for input(s): AMMONIA in the last 168 hours. Coagulation Profile: No results for input(s): INR, PROTIME in the last 168 hours. Cardiac Enzymes: No results for input(s): CKTOTAL, CKMB, CKMBINDEX, TROPONINI in the last 168 hours. BNP (last 3 results) No results for input(s): PROBNP in the last 8760 hours. HbA1C: No results for input(s): HGBA1C in the last 72 hours. CBG: No results for input(s): GLUCAP in the last 168 hours. Lipid Profile: No  results for input(s): CHOL, HDL, LDLCALC, TRIG, CHOLHDL, LDLDIRECT in the last 72 hours. Thyroid Function Tests: No results for input(s): TSH, T4TOTAL, FREET4, T3FREE, THYROIDAB in the last 72 hours. Anemia Panel: No results for input(s): VITAMINB12, FOLATE, FERRITIN, TIBC, IRON, RETICCTPCT in the last 72 hours.    Radiology Studies: I have reviewed all of the imaging during this hospital visit personally     Scheduled Meds: . allopurinol  100 mg Oral Daily  . apixaban  5 mg Oral BID  . docusate sodium  100 mg Oral Daily  . escitalopram  20 mg Oral Daily  . furosemide  40 mg Intravenous Q12H  . ipratropium-albuterol  3 mL Nebulization Q6H  . levothyroxine  150 mcg Oral QAC breakfast  . multivitamin with minerals  1 tablet Oral Daily  . nortriptyline  75 mg Oral QHS  . polyethylene glycol  17 g Oral Daily  . predniSONE  40 mg Oral Q breakfast  . propranolol  10 mg Oral BID   Continuous Infusions:   LOS: 2 days        Chandlor Noecker Gerome Apley, MD [facility] Pager [phone]

## 2018-04-10 NOTE — Evaluation (Signed)
Physical Therapy Evaluation Patient Details Name: Wayne Booth MRN: [id number] DOB: 10-Mar-1941 Today's Date: 04/10/2018   History of Present Illness  Pt is a 77 y.o. M who presents with dyspnea on exertion s/p thoracentesis. Has significant PMH for atrial fibrillation, coronary artery disease, hypertension, hypothyroidism, prostate CA and chronic kidney disease stage III. Recently discharged from CIR.  Clinical Impression  Pt admitted with above diagnosis. Pt currently with functional limitations due to the deficits listed below (see PT Problem List). Patient reports being independent with mobility and ADL's after discharge from CIR; denies history of recent falls. Currently ambulating 250 feet with no assistive device and supervision; mild balance deficits noted. Presenting with dyspnea on exertion (DOE 2/4) with decrease in SpO2 from 96 to 85% on RA with ambulation. Pt will benefit from skilled PT to increase their independence and safety with mobility to allow discharge to the venue listed below.       Follow Up Recommendations Home health PT    Equipment Recommendations  None recommended by PT    Recommendations for Other Services       Precautions / Restrictions Precautions Precautions: Fall Precaution Comments: watch O2 Restrictions Weight Bearing Restrictions: No      Mobility  Bed Mobility               General bed mobility comments: OOB in chair  Transfers Overall transfer level: Needs assistance Equipment used: None Transfers: Sit to/from Stand Sit to Stand: Supervision            Ambulation/Gait Ambulation/Gait assistance: Supervision Gait Distance (Feet): 250 Feet Assistive device: None Gait Pattern/deviations: Wide base of support;Step-through pattern;Decreased stride length;Decreased dorsiflexion - right;Decreased dorsiflexion - left Gait velocity: decr   General Gait Details: Mild unsteadiness but no overt LOB. DOE 2/4  Marine [employer] (Stroke Patients Only)       Balance Overall balance assessment: Mild deficits observed, not formally tested                                           Pertinent Vitals/Pain Pain Assessment: No/denies pain    Home Living Family/patient expects to be discharged to:: Private residence Living Arrangements: Spouse/significant other Available Help at Discharge: Family;Available 24 hours/day Type of Home: House Home Access: Level entry     Home Layout: Two level;Able to live on main level with bedroom/bathroom Home Equipment: Kasandra Knudsen - single point      Prior Function Level of Independence: Independent         Comments: enjoys fishing, has been receiving HHPT     Hand Dominance   Dominant Hand: Right    Extremity/Trunk Assessment   Upper Extremity Assessment Upper Extremity Assessment: Overall WFL for tasks assessed    Lower Extremity Assessment Lower Extremity Assessment: Overall WFL for tasks assessed    Cervical / Trunk Assessment Cervical / Trunk Assessment: Normal  Communication   Communication: No difficulties  Cognition Arousal/Alertness: Awake/alert Behavior During Therapy: WFL for tasks assessed/performed Overall Cognitive Status: Within Functional Limits for tasks assessed                                        General Comments  Exercises     Assessment/Plan    PT Assessment Patient needs continued PT services  PT Problem List Decreased strength;Decreased activity tolerance;Decreased balance;Decreased mobility;Cardiopulmonary status limiting activity       PT Treatment Interventions DME instruction;Gait training;Stair training;Functional mobility training;Therapeutic activities;Therapeutic exercise;Balance training;Patient/family education    PT Goals (Current goals can be found in the Care Plan section)  Acute Rehab PT Goals Patient Stated Goal: "get oxygen  at home for walking." PT Goal Formulation: With patient Time For Goal Achievement: 04/24/18 Potential to Achieve Goals: Good    Frequency Min 3X/week   Barriers to discharge        Co-evaluation               AM-PAC PT "6 Clicks" Daily Activity  Outcome Measure Difficulty turning over in bed (including adjusting bedclothes, sheets and blankets)?: None Difficulty moving from lying on back to sitting on the side of the bed? : None Difficulty sitting down on and standing up from a chair with arms (e.g., wheelchair, bedside commode, etc,.)?: None Help needed moving to and from a bed to chair (including a wheelchair)?: A Little Help needed walking in hospital room?: A Little Help needed climbing 3-5 steps with a railing? : A Little 6 Click Score: 21    End of Session Equipment Utilized During Treatment: Gait belt Activity Tolerance: Patient tolerated treatment well Patient left: in chair;with call bell/phone within reach Nurse Communication: Mobility status PT Visit Diagnosis: Unsteadiness on feet (R26.81);Other abnormalities of gait and mobility (R26.89);Difficulty in walking, not elsewhere classified (R26.2)    Time: 4734-0370 PT Time Calculation (min) (ACUTE ONLY): 28 min   Charges:   PT Evaluation $PT Eval Moderate Complexity: 1 Mod PT Treatments $Therapeutic Activity: 8-22 mins       Ellamae Sia, PT, DPT Acute Rehabilitation Services Pager [phone] Office [phone]   Willy Eddy 04/10/2018, 5:28 PM

## 2018-04-10 NOTE — Progress Notes (Signed)
SATURATION QUALIFICATIONS: (This note is used to comply with regulatory documentation for home oxygen)  Patient Saturations on Room Air at Rest = 96%  Patient Saturations on Room Air while Ambulating = 85%  Patient Saturations on 2 Liters of oxygen while Ambulating = 92%  Please briefly explain why patient needs home oxygen: To maintain oxygen saturations > 90% when ambulating.  Ellamae Sia, PT, DPT Acute Rehabilitation Services Pager [phone] Office [phone]

## 2018-04-11 ENCOUNTER — Ambulatory Visit: Payer: Medicare Other | Admitting: Cardiovascular Disease

## 2018-04-11 LAB — BASIC METABOLIC PANEL
ANION GAP: 8 (ref 5–15)
BUN: 19 mg/dL (ref 8–23)
CALCIUM: 8.5 mg/dL — AB (ref 8.9–10.3)
CHLORIDE: 101 mmol/L (ref 98–111)
CO2: 27 mmol/L (ref 22–32)
Creatinine, Ser: 1.42 mg/dL — ABNORMAL HIGH (ref 0.61–1.24)
GFR calc Af Amer: 54 mL/min — ABNORMAL LOW (ref >60)
GFR calc non Af Amer: 47 mL/min — ABNORMAL LOW (ref >60)
GLUCOSE: 91 mg/dL (ref 70–99)
Potassium: 4.1 mmol/L (ref 3.5–5.1)
Sodium: 136 mmol/L (ref 135–145)

## 2018-04-11 LAB — PH, BODY FLUID: pH, Body Fluid: 7.4

## 2018-04-11 MED ORDER — FUROSEMIDE 40 MG PO TABS: 40.0000 mg | ORAL_TABLET | Freq: Every day | ORAL | 0 refills | Status: DC

## 2018-04-11 MED ORDER — GUAIFENESIN-DM 100-10 MG/5ML PO SYRP: 5.0000 mL | ORAL_SOLUTION | Freq: Four times a day (QID) | ORAL | 0 refills | Status: DC | PRN

## 2018-04-11 NOTE — Progress Notes (Signed)
All d/c instructions have been provided for patient.  Patient waits only for O2 delivery prior to discharge.

## 2018-04-11 NOTE — Discharge Summary (Signed)
Physician Discharge Summary  Wayne Booth AGT:[id number] DOB: December 22, 1940 DOA: 04/08/2018  PCP: Marin Olp, MD  Admit date: 04/08/2018 Discharge date: 04/11/2018  Admitted From: Home  Disposition:  Home   Recommendations for Outpatient Follow-up and new medication changes:  1. Follow up with Dr. Yong Channel as scheduled, in am.  2. Please follow up on renal function 3. Furosemide has been increased to 40 mg daily.   Home Health: Yes   Equipment/Devices: Home oxygen   Discharge Condition: stable  CODE STATUS: full  Diet recommendation: Heart healthy diet.   Brief/Interim Summary: 77 year old male who presented with dyspnea on exertion. He does have significant past medical history for atrial fibrillation, coronary artery disease, hypertension, hypothyroidism, prostate cancer andchronic kidney disease stage III.Patient was recently dischargedfrom inpatient rehab after suffering MRSA pneumonia, February 26, 2018. At home patient noted to have dyspnea, butfor last few days prior to hospitalization he had worsening symptoms, mainly on exertion associated with chest tightness. On his initial physical examination blood pressure 135/78, heart rate 87, respiratory rate27, temperature is 97.8, oxygen saturation 93% on supplemental oxygen.He had diffuse expiratory wheezing, left lower lobe rales, decreased breath sounds at the left base, heart S1-S2 present and rhythmic, abdomen soft nontender, trace lower extremity edema. Sodium 133, potassium 3.7, chloride 98, bicarb 25, glucose 114, BUN 17, creatinine 1.45, BNP 62.9, white count 12.2, hemoglobin 14.0, hematocrit 45.2, platelets 399, procalcitonin less than 0.10. Urinalysis negative for infection. Chest x-ray with left pleural effusion, increased vascular congestion. EKG sinus rhythm, normal axis, anteroseptal Q waves (chronic).  Patient was admitted to the hospital with the working diagnosis of acute hypoxemic respiratory failure  due to cardiogenic pulmonary edema due to decompensated diastolic heart failure (acute on chronic) complicated by bilateral pleural effusions.  1.  Acute hypoxic respiratory failure due to cardiogenic pulmonary edema and left pleural effusion, related to diastolic heart failure exacerbation acute on chronic. (Present on admission).  Patient was admitted to the medical stepdown unit, he received diuresis with IV furosemide, and underwent ultrasound-guided thoracentesis.  Negative fluid balance was achieved with significant improvement of his symptoms.  1 L of yellow fluid was obtained from his left pleural space.  Patient will be discharged on 40 mg of furosemide daily, continue propanolol.  Ambulatory oximetry on room air 85%, improved to 92 on 2 L, patient will have home oxygen prescribed.  Pneumonia was ruled out.   2.  Left pleural effusion transudate.   Patient underwent ultrasound-guided thoracentesis, LDH 132, protein 4.3, pH 7.4, white cell count 2608, lymphocytes 20 and neutrophils 65.  Gram stain with no organisms.  Etiology of effusion likely related to recent pneumonia.  No signs of current infection, no antibiotics prescribed.  3.  Chronic kidney disease stage III.  Patient tolerated well diuresis, patient will continue furosemide 40 mg daily, his discharge creatinine is 1.4, potassium 4.1 and serum bicarb 27.  His base creatinine 01.4 to 1.5.  4.  Paroxysmal atrial fibrillation.  Patient had ablation in the past, and has remained sinus rhythm, continue propanolol (prescribed for essential tremors), anticoagulation with apixaban.  5.  COPD.  No signs of acute exacerbation, continue bronchodilators.  Patient qualify for home oxygen.  6.  Hypothyroidism.  Continue levothyroxine.  7.  Depression/anxiety.  Continue citalopram and nortriptyline.   Discharge Diagnoses:  Principal Problem:   Acute hypoxemic respiratory failure (HCC) Active Problems:   OSA (obstructive sleep apnea)    Hypothyroidism   Gout   Atrial flutter ([place])  Chronic kidney disease (CKD), stage III (moderate) (HCC)   CAD (coronary artery disease)   Depression   Chronic pain    Discharge Instructions   Allergies as of 04/11/2018      Reactions   Ambien [zolpidem] Anxiety   Anxiety and agitation when tried as sleeper in hospital aug 2019      Medication List    TAKE these medications   albuterol 108 (90 Base) MCG/ACT inhaler Commonly known as:  PROVENTIL HFA;VENTOLIN HFA Inhale 2 puffs into the lungs every 6 (six) hours as needed for wheezing or shortness of breath.   allopurinol 100 MG tablet Commonly known as:  ZYLOPRIM Take 1 tablet (100 mg total) by mouth daily.   apixaban 5 MG Tabs tablet Commonly known as:  ELIQUIS Take 1 tablet (5 mg total) by mouth 2 (two) times daily.   budesonide-formoterol 160-4.5 MCG/ACT inhaler Commonly known as:  SYMBICORT Inhale 2 puffs into the lungs 2 (two) times daily.   colchicine 0.6 MG tablet Take 1 tablet (0.6 mg total) by mouth 2 (two) times daily.   cyclobenzaprine 10 MG tablet Commonly known as:  FLEXERIL Take 1 tablet (10 mg total) by mouth 3 (three) times daily as needed for muscle spasms.   docusate sodium 100 MG capsule Commonly known as:  COLACE Take 1 capsule (100 mg total) by mouth daily.   escitalopram 20 MG tablet Commonly known as:  LEXAPRO Take 1 tablet (20 mg total) by mouth daily.   furosemide 40 MG tablet Commonly known as:  LASIX Take 1 tablet (40 mg total) by mouth daily. What changed:    medication strength  how much to take  when to take this  reasons to take this   guaiFENesin-dextromethorphan 100-10 MG/5ML syrup Commonly known as:  ROBITUSSIN DM Take 5 mLs by mouth every 6 (six) hours as needed for cough.   levothyroxine 150 MCG tablet Commonly known as:  SYNTHROID, LEVOTHROID Take 1 tablet (150 mcg total) by mouth daily.   multivitamins ther. w/minerals Tabs tablet Take 1 tablet by mouth  daily.   nortriptyline 75 MG capsule Commonly known as:  PAMELOR Take 1 capsule (75 mg total) by mouth at bedtime.   polyethylene glycol packet Commonly known as:  MIRALAX / GLYCOLAX Take 17 g by mouth daily.   propranolol 10 MG tablet Commonly known as:  INDERAL TAKE 1 TABLET (10 MG TOTAL) BY MOUTH 2 (TWO) TIMES DAILY.   traMADol 50 MG tablet Commonly known as:  ULTRAM Take 1 tablet (50 mg total) by mouth every 6 (six) hours as needed for moderate pain.   traZODone 50 MG tablet Commonly known as:  DESYREL TAKE ONE HALF TO ONE TABLET BY MOUTH EVERY NIGHT AT BEDTIME AS NEEDED FOR SLEEP            Durable Medical Equipment  (From admission, onward)         Start     Ordered   04/11/18 0000  For home use only DME oxygen    Question Answer Comment  Mode or (Route) Nasal cannula   Liters per Minute 2   Frequency Continuous (stationary and portable oxygen unit needed)   Oxygen conserving device Yes   Oxygen delivery system Gas      04/11/18 1110          Allergies  Allergen Reactions  . Ambien [Zolpidem] Anxiety    Anxiety and agitation when tried as sleeper in hospital aug 2019    Consultations:  IR   Procedures/Studies: Dg Chest 1 View  Result Date: 04/09/2018 CLINICAL DATA:  Status post thoracentesis, 1 L removed. EXAM: CHEST  1 VIEW COMPARISON:  Chest x-ray dated 04/08/2018. FINDINGS: Improved aeration at the LEFT lung base. No pneumothorax seen. RIGHT lung remains clear. Heart size and mediastinal contours are stable. IMPRESSION: Improved aeration at the LEFT lung base status post thoracentesis. No pneumothorax seen. Electronically Signed   By: Franki Cabot M.D.   On: 04/09/2018 14:47   Dg Chest 2 View  Result Date: 04/08/2018 CLINICAL DATA:  Shortness of breath EXAM: CHEST - 2 VIEW COMPARISON:  02/22/2018 FINDINGS: Cardiac enlargement. Pulmonary vascularity is normal. Moderate left and small right pleural effusions. Basilar atelectasis or  consolidation more prominent on the left. This is progressing since previous study. No pneumothorax. Calcification of the aorta. Degenerative changes in the spine. IMPRESSION: Bilateral pleural effusions with basilar atelectasis or consolidation, greater on the left. Progression since previous study. Electronically Signed   By: Lucienne Capers M.D.   On: 04/08/2018 22:17   Dg Chest Port 1 View  Result Date: 04/10/2018 CLINICAL DATA:  Difficulty breathing EXAM: PORTABLE CHEST 1 VIEW COMPARISON:  04/09/2018 FINDINGS: Cardiac shadow is enlarged but stable. The lungs are well aerated bilaterally. Elevation of left hemidiaphragm is noted. No pneumothorax is seen. Minimal residual pleural effusion on the left is noted. No bony abnormality is noted. IMPRESSION: Elevation of the left hemidiaphragm. Minimal left pleural effusion is seen. No pneumothorax is noted. Electronically Signed   By: Inez Catalina M.D.   On: 04/10/2018 08:13   US Thoracentesis Asp Pleural Space W/img Guide  Result Date: 04/09/2018 INDICATION: History of a.fib, CAD, CHF, prostate cancer, CKD III. Recently admitted to [facility] for dyspnea on exertion - found to be in acute hypoxic respiratory failure due to pulmonary edema and left pleural effusion. Request for diagnostic and therapeutic thoracentesis today. EXAM: ULTRASOUND GUIDED LEFT THORACENTESIS MEDICATIONS: 10 mL 1% lidocaine. COMPLICATIONS: None immediate. PROCEDURE: An ultrasound guided thoracentesis was thoroughly discussed with the patient and questions answered. The benefits, risks, alternatives and complications were also discussed. The patient understands and wishes to proceed with the procedure. Written consent was obtained. Ultrasound was performed to localize and mark an adequate pocket of fluid in the left chest. The area was then prepped and draped in the normal sterile fashion. 1% Lidocaine was used for local anesthesia. Under ultrasound guidance a 6 Fr Safe-T-Centesis catheter  was introduced. Thoracentesis was performed. The catheter was removed and a dressing applied. FINDINGS: A total of approximately 1.0 L of yellow fluid was removed. Samples were sent to the laboratory as requested by the clinical team. IMPRESSION: Successful ultrasound guided left thoracentesis yielding 1.0 L of pleural fluid. Read by Candiss Norse, PA-C Electronically Signed   By: Markus Daft M.D.   On: 04/09/2018 14:50       Subjective: Patient is feeling better, dyspnea has improved, positive cough, no nausea or vomiting.   Discharge Exam: Vitals:   04/11/18 0743 04/11/18 0744  BP:  120/68  Pulse:    Resp:  17  Temp:  98.5 F (36.9 C)  SpO2: 97% 97%   Vitals:   04/11/18 0143 04/11/18 0314 04/11/18 0743 04/11/18 0744  BP:  133/71  120/68  Pulse:  84    Resp:  (!) 23  17  Temp:  97.7 F (36.5 C)  98.5 F (36.9 C)  TempSrc:  Axillary  Oral  SpO2: 95% 96% 97% 97%  Weight:  Height:        General: Not in pain or dyspnea.  Neurology: Awake and alert, non focal  E ENT: mild pallor, no icterus, oral mucosa moist Cardiovascular: No JVD. S1-S2 present, rhythmic, no gallops, rubs, or murmurs. Trace lower extremity edema. Pulmonary: positive breath sounds bilaterally, adequate air movement, no wheezing,or rhonchi, positive bibasilar rales. Gastrointestinal. Abdomen protuberant no organomegaly, non tender, no rebound or guarding Skin. No rashes Musculoskeletal: no joint deformities   The results of significant diagnostics from this hospitalization (including imaging, microbiology, ancillary and laboratory) are listed below for reference.     Microbiology: Recent Results (from the past 240 hour(s))  MRSA PCR Screening     Status: None   Collection Time: 04/09/18  3:39 AM  Result Value Ref Range Status   MRSA by PCR NEGATIVE NEGATIVE Final    Comment:        The GeneXpert MRSA Assay (FDA approved for NASAL specimens only), is one component of a comprehensive MRSA  colonization surveillance program. It is not intended to diagnose MRSA infection nor to guide or monitor treatment for MRSA infections. Performed at [facility], [place] [address], [place], [place] [address]   Culture, body fluid-bottle     Status: None (Preliminary result)   Collection Time: 04/09/18  2:24 PM  Result Value Ref Range Status   Specimen Description PLEURAL  Final   Special Requests NONE  Final   Culture   Final    NO GROWTH < 24 HOURS Performed at [facility], [place] [address], [place], [place] [address]    Report Status PENDING  Incomplete  Gram stain     Status: None   Collection Time: 04/09/18  2:24 PM  Result Value Ref Range Status   Specimen Description PLEURAL  Final   Special Requests NONE  Final   Gram Stain   Final    MODERATE WBC PRESENT, PREDOMINANTLY PMN NO ORGANISMS SEEN Performed at [facility], [place] [address] [place], [place] [address]    Report Status 04/09/2018 FINAL  Final     Labs: BNP (last 3 results) Recent Labs    02/11/18 0957 02/14/18 0500 04/08/18 2241  BNP 64.4 145.0* 51.8   Basic Metabolic Panel: Recent Labs  Lab 04/08/18 2130 04/09/18 1532 04/10/18 0202 04/11/18 0214  NA 133* 134* 133* 136  K 3.7 5.1 3.9 4.1  CL 98 101 100 101  CO2 25 26 22 27   GLUCOSE 114* 154* 153* 91  BUN 17 16 17 19   CREATININE 1.45* 1.29* 1.35* 1.42*  CALCIUM 8.8* 8.9 8.8* 8.5*   Liver Function Tests: Recent Labs  Lab 04/08/18 2130  AST 21  ALT 18  ALKPHOS 60  BILITOT 0.5  PROT 6.5  ALBUMIN 3.1*   Recent Labs  Lab 04/08/18 2130  LIPASE 42   No results for input(s): AMMONIA in the last 168 hours. CBC: Recent Labs  Lab 04/08/18 2130 04/09/18 0247 04/10/18 0202  WBC 12.2* 13.2* 15.4*  NEUTROABS  --   --  12.5*  HGB 14.0 13.6 13.6  HCT 45.2 43.1 41.9  MCV 91.7 90.5 89.1  PLT 399 368 375   Cardiac Enzymes: No results for input(s): CKTOTAL, CKMB, CKMBINDEX, TROPONINI in the last 168 hours. BNP: Invalid  input(s): POCBNP CBG: No results for input(s): GLUCAP in the last 168 hours. D-Dimer Recent Labs    04/09/18 0612  DDIMER 2.52*   Hgb A1c No results for input(s): HGBA1C in the  last 72 hours. Lipid Profile No results for input(s): CHOL, HDL, LDLCALC, TRIG, CHOLHDL, LDLDIRECT in the last 72 hours. Thyroid function studies No results for input(s): TSH, T4TOTAL, T3FREE, THYROIDAB in the last 72 hours.  Invalid input(s): FREET3 Anemia work up No results for input(s): VITAMINB12, FOLATE, FERRITIN, TIBC, IRON, RETICCTPCT in the last 72 hours. Urinalysis    Component Value Date/Time   COLORURINE YELLOW 04/08/2018 2126   APPEARANCEUR CLEAR 04/08/2018 2126   LABSPEC 1.015 04/08/2018 2126   PHURINE 5.0 04/08/2018 2126   GLUCOSEU NEGATIVE 04/08/2018 2126   HGBUR NEGATIVE 04/08/2018 2126   HGBUR 2+ 09/22/2007 1444   BILIRUBINUR NEGATIVE 04/08/2018 2126   BILIRUBINUR n 11/07/2015 1417   KETONESUR NEGATIVE 04/08/2018 2126   PROTEINUR NEGATIVE 04/08/2018 2126   UROBILINOGEN 1.0 11/07/2015 1417   UROBILINOGEN 1.0 05/22/2011 1454   NITRITE NEGATIVE 04/08/2018 2126   LEUKOCYTESUR NEGATIVE 04/08/2018 2126   Sepsis Labs Invalid input(s): PROCALCITONIN,  WBC,  LACTICIDVEN Microbiology Recent Results (from the past 240 hour(s))  MRSA PCR Screening     Status: None   Collection Time: 04/09/18  3:39 AM  Result Value Ref Range Status   MRSA by PCR NEGATIVE NEGATIVE Final    Comment:        The GeneXpert MRSA Assay (FDA approved for NASAL specimens only), is one component of a comprehensive MRSA colonization surveillance program. It is not intended to diagnose MRSA infection nor to guide or monitor treatment for MRSA infections. Performed at [facility], [place] [address] [place], [place] [address]   Culture, body fluid-bottle     Status: None (Preliminary result)   Collection Time: 04/09/18  2:24 PM  Result Value Ref Range Status   Specimen Description PLEURAL  Final    Special Requests NONE  Final   Culture   Final    NO GROWTH < 24 HOURS Performed at [facility], [place] [address], [place], [place] [address]    Report Status PENDING  Incomplete  Gram stain     Status: None   Collection Time: 04/09/18  2:24 PM  Result Value Ref Range Status   Specimen Description PLEURAL  Final   Special Requests NONE  Final   Gram Stain   Final    MODERATE WBC PRESENT, PREDOMINANTLY PMN NO ORGANISMS SEEN Performed at [facility], [place] [address] [place], [place] [address]    Report Status 04/09/2018 FINAL  Final     Time coordinating discharge: 45 minutes  SIGNED:   Tawni Millers, MD  [facility] 04/11/2018, 10:48 AM Pager [phone]  If 7PM-7AM, please contact night-coverage [email or web address] Password [email or web address]

## 2018-04-11 NOTE — Care Management Note (Addendum)
Case Management Note  Patient Details  Name: DEVARIO BUCKLEW MRN: [id number] Date of Birth: 01/15/1941  Subjective/Objective:  Pt presented for Acute Hypoxemia. PTA Independent from home with support of wife and son. Pt was previously active with [facility] for [facility] RN, PT/OT Services. Pt states he wants to utilize [facility] for 02 as well and that he wants a small tank. CM did explain that he will get the e tank due to insurance purposes and CM did ask for simply go smaller tank.                Action/Plan: CM did make Butch Penny with [facility] aware that patient has been hospitalized. Referral for 02 made with Jermaine with [facility]- awaiting insurance approval. Patient and staff RN aware that insurance will need to approve 02 before delivery. No further home needs at this time.    Expected Discharge Date:  04/11/18               Expected Discharge Plan:  [place]  In-House Referral:  NA  Discharge planning Services  CM Consult  Post Acute Care Choice:  Durable Medical Equipment, Home Health Choice offered to:  Patient  DME Arranged:  Oxygen DME Agency:  [place] Arranged:  RN, Disease Management, PT, OT [place] Agency:  [place]  Status of Service:  Completed, signed off  If discussed at [place] of Stay Meetings, dates discussed:    Additional Comments:  Bethena Roys, RN 04/11/2018, 12:19 PM

## 2018-04-11 NOTE — Telephone Encounter (Signed)
Spoke with Margaretha Sheffield, she states that she did get ahold of patient - he had been out of town. Physical therapy has an appt scheduled with him. No further needs.

## 2018-04-11 NOTE — Progress Notes (Signed)
Initial Nutrition Assessment  DOCUMENTATION CODES:   Obesity unspecified  INTERVENTION:   Provided Salt Restriction for HF diet education.   NUTRITION DIAGNOSIS:   Food and nutrition related knowledge deficit related to limited prior education as evidenced by per patient/family report.   GOAL:   Other (Comment)   MONITOR:   PO intake  REASON FOR ASSESSMENT:   Consult Diet education  ASSESSMENT:   77 yo male, admitted for dyspnea on exertion worsening since hospital discharge in Sept 2019 (sepsis, acute hypoxemic resp failure secondary to MRSA PNA). PMHx significant for atrial fibrillation, CAD, HTN, hypothyroidism, OSA, prostate cancer, CKD 3.  Labs: BUN WNL at 19 trending up, Creatinine 1.42, GFR 47 Meds: colace, levothyroxine, MVI with minerals, miralax  Nutrition Education Note  RD consulted for nutrition education regarding new onset CHF.  RD provided "Low Sodium Nutrition Therapy" handout from the Academy of [employer] and Dietetics. Reviewed patient's dietary recall. Provided examples on ways to decrease sodium intake in diet. Discouraged intake of processed foods and use of salt shaker. Encouraged fresh fruits and vegetables as well as whole grain sources of carbohydrates to maximize fiber intake.   RD discussed why it is important for patient to adhere to diet recommendations, and emphasized the role of fluids, foods to avoid, and importance of weighing self daily. Teach back method used.  Expect good compliance.  Body mass index is 37.67 kg/m.   Current diet order is Low Sodium Heart Healthy, patient is consuming approximately 100% of meals at this time. Labs and medications reviewed. No further nutrition interventions warranted at this time. RD contact information provided. If additional nutrition issues arise, please re-consult RD.    NUTRITION - FOCUSED PHYSICAL EXAM: Did not complete today   Diet Order:  100% per nsg Diet Order            Diet -  low sodium heart healthy        Diet Heart Room service appropriate? Yes; Fluid consistency: Thin  Diet effective now              EDUCATION NEEDS:   Education needs have been addressed  Skin:  Skin Assessment: Reviewed RN Assessment  Last BM:  10/13 - type 4  Height:   Ht Readings from Last 1 Encounters:  04/09/18 5\' 11"  (1.803 m)    Weight:   Wt Readings from Last 1 Encounters:  04/10/18 122.5 kg    Ideal Body Weight:  78.2 kg  BMI:  Body mass index is 37.67 kg/m.  Estimated Nutritional Needs:   Kcal:  1987 kcal/day(MSJ)  Protein:  78-94 g protein/day(1.0-1.2 g/kg IBW)  Fluid:  1 mL/kcal or per MD discretion  Althea Grimmer, MS, RDN, LDN On-call pager: [phone]

## 2018-04-11 NOTE — Progress Notes (Signed)
Patient discharged home w/O2, belongings, AVS.  Son is transporting patient home via personal vehicle.

## 2018-04-11 NOTE — Progress Notes (Signed)
All education completed, including current education and information being provided by dietician.  PIV removed per protocol.  AVS reviewed w/patient w/stated verbal understanding.  Await transport home per son.

## 2018-04-12 ENCOUNTER — Encounter: Payer: Self-pay | Admitting: Family Medicine

## 2018-04-12 ENCOUNTER — Ambulatory Visit (INDEPENDENT_AMBULATORY_CARE_PROVIDER_SITE_OTHER): Payer: Medicare Other | Admitting: Family Medicine

## 2018-04-12 VITALS — BP 106/68 | HR 98 | Temp 98.9°F | Ht 71.0 in | Wt 270.2 lb

## 2018-04-12 DIAGNOSIS — I2699 Other pulmonary embolism without acute cor pulmonale: Secondary | ICD-10-CM | POA: Insufficient documentation

## 2018-04-12 DIAGNOSIS — I5032 Chronic diastolic (congestive) heart failure: Secondary | ICD-10-CM | POA: Diagnosis not present

## 2018-04-12 DIAGNOSIS — N183 Chronic kidney disease, stage 3 unspecified: Secondary | ICD-10-CM

## 2018-04-12 DIAGNOSIS — I2782 Chronic pulmonary embolism: Secondary | ICD-10-CM | POA: Diagnosis not present

## 2018-04-12 DIAGNOSIS — J4540 Moderate persistent asthma, uncomplicated: Secondary | ICD-10-CM | POA: Diagnosis not present

## 2018-04-12 DIAGNOSIS — I2510 Atherosclerotic heart disease of native coronary artery without angina pectoris: Secondary | ICD-10-CM

## 2018-04-12 DIAGNOSIS — J9611 Chronic respiratory failure with hypoxia: Secondary | ICD-10-CM

## 2018-04-12 DIAGNOSIS — I4892 Unspecified atrial flutter: Secondary | ICD-10-CM

## 2018-04-12 LAB — PATHOLOGIST SMEAR REVIEW

## 2018-04-12 NOTE — Assessment & Plan Note (Signed)
Continue Symbicort.  Has not been noted as COPD by pulmonology so will not document as such-had been noted as COPD by hospitalist.

## 2018-04-12 NOTE — Assessment & Plan Note (Signed)
Patient appears to be maintaining sinus rhythm after prior ablation in August.  Continue Eliquis.  Continue propranolol-mainly for essential tremor at this point but would provide rate control if converted back to atrial flutter

## 2018-04-12 NOTE — Progress Notes (Signed)
Subjective:  Wayne Booth is a 77 y.o. year old very pleasant male patient who presents for transitional care management and hospital follow up for acute hypoxemic respiratory failure due to cardiogenic pulmonary edema due to decompensated heart failure with preserved ejection fraction, complicated by left pleural effusion. Patient was hospitalized from 10/11/201 to 04/11/2018. A TCM phone call was not required as already scheduled for hospital follow up today (day after discharge). Medical complexity moderate.    Wayne Booth is a 77 year old male who presented to the hospital with dyspnea on exertion and ultimately was found to have hypoxemic respiratory failure due to cardiogenic pulmonary edema due to decompensated heart failure with preserved ejection fraction and complicated by bilateral pleural effusions but worse on left -thought related to prior pneumonia.  Patient had been hospitalized in recent months for MRSA pneumonia.  He had been discharged February 26, 2018 from inpatient rehab.  Patient had some dyspnea at home and symptoms worsened a few days before hospitalization.  I had seen him for hospital discharge follow-up previously and advised a course of Lasix and follow-up if he failed to improve.  He did not have a chance to follow-up as symptoms rapidly worsened.  On presentation to the hospital patient had expiratory wheezing, left lower lobe rales, decreased breath sounds at the left lung base.  Surprisingly BNP was not elevated.  His chest x-ray showed a left pleural effusion and increased vascular congestion.  EKG was sinus rhythm with chronic anteroseptal Q waves.  His acute hypoxic respiratory failure due to cardiogenic pulmonary edema and complicated by bilateral but worse on left pleural effusion was treated initially in the medical stepdown unit with IV diuresis.  Patient ultimately required an ultrasound-guided thoracentesis.  1 L of yellow transudate of fluid was removed from left  pleural space and patient had significant improvement in his symptoms.  On discharge patient was sent home on 40 mg of furosemide daily.  Patient did have ambulatory desaturation before discharge of oxygen.  He was prescribed home oxygen to use with activity he reports.  Even though he had prior pneumonia there was no concern of recurrent pneumonia at present.  It was thought that his effusion was related to his prior pneumonia though.  No antibiotics were prescribed at this hospitalization.  Cultures from pleural fluid showed no growth.  Patient with chronic kidney disease stage III-fortunately this tolerated diuresis well.  His discharge creatinine was 1.4 which was close to his baseline.  Unilateral kidney noted.  Paroxysmal atrial flutter- patient with history of ablation in recent months.  He appeared to maintain sinus rhythm throughout hospitalization.  He was continued on propranolol for his essential tremors which also moderated heart rate.  He was continued on his Eliquis.  Eliquis also covers his history of pulmonary embolism  Asthmatic bronchitis per problem list.  COPD noted by hospitalist.  They denied acute exacerbation.  They encouraged continued bronchodilators.  He is on Symbicort  Hypothyroidism-patient continued on home levothyroxine.  For his chronic anxiety he was continued on citalopram and nortriptyline.  He does report some dry mouth and we discussed this likely was related to the nortriptyline.  He finds it helpful so he would like to continue the medication though.  I personally reviewed chest x-ray from 04/09/2018-compared to prior films this shows improved aeration of the left lung base.  No acute cardiopulmonary abnormalities otherwise.  No sign of pneumothorax.  Cardiomegaly noted but stable.  Chest x-ray on 04/10/2018 was stable compared to  04/09/2018 x-ray.   See problem oriented charting as well ROS- no fever. Some cough. Some crackling sounds from lungs. Still short of  breath with activity.    Past Medical History-  Patient Active Problem List   Diagnosis Date Noted  . CAD (coronary artery disease) 04/09/2018    Priority: High  . HAP (hospital-acquired pneumonia) 04/08/2018    Priority: High  . (HFpEF) heart failure with preserved ejection fraction ([place]) 12/16/2017    Priority: High  . Chest pain     Priority: High  . Atrial flutter ([place]) 11/01/2017    Priority: High  . Chronic pulmonary embolism ([place]) 10/23/2016    Priority: High  . Asthmatic bronchitis 06/09/2011    Priority: High  . 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 History of renal cell carcinoma 12/26/2007    Priority: High  . Chronic kidney disease (CKD), stage III (moderate) (HCC)     Priority: Medium  . HCAP (healthcare-associated pneumonia) 02/03/2018    Priority: Medium  . History of adenomatous polyp of colon 04/12/2017    Priority: Medium  . Cervical disc disease 11/13/2015    Priority: Medium  . Essential tremor 08/14/2015    Priority: Medium  . Gout 08/14/2015    Priority: Medium  . Hypothyroidism 07/12/2014    Priority: Medium  . Hyperglycemia 07/12/2014    Priority: Medium  . GAD (generalized anxiety disorder) 11/07/2013    Priority: Medium  . OSA (obstructive sleep apnea) 07/07/2011    Priority: Medium  . Morbid obesity ([place]) 10/02/2008    Priority: Medium  . Essential hypertension 12/28/2006    Priority: Medium  . Constipation 01/21/2018    Priority: Low  . Anticoagulated     Priority: Low  . Bilateral foot pain 07/13/2017    Priority: Low  . Community acquired pneumonia 06/18/2017    Priority: Low  . Actinic keratosis 11/27/2009    Priority: Low  . Osteoarthritis 11/13/2008    Priority: Low  . TESTOSTERONE DEFICIENCY 12/28/2006    Priority: Low  . Labile blood pressure   . Steroid-induced hyperglycemia   . Debility 02/26/2018  . Insomnia 12/26/2007    Medications- reviewed and updated  A medical reconciliation was  performed comparing current medicines to hospital discharge medications. Current Outpatient Medications  Medication Sig Dispense Refill  . albuterol (PROVENTIL HFA;VENTOLIN HFA) 108 (90 Base) MCG/ACT inhaler Inhale 2 puffs into the lungs every 6 (six) hours as needed for wheezing or shortness of breath. 1 Inhaler 5  . allopurinol (ZYLOPRIM) 100 MG tablet Take 1 tablet (100 mg total) by mouth daily. 30 tablet 6  . apixaban (ELIQUIS) 5 MG TABS tablet Take 1 tablet (5 mg total) by mouth 2 (two) times daily. 60 tablet 11  . budesonide-formoterol (SYMBICORT) 160-4.5 MCG/ACT inhaler Inhale 2 puffs into the lungs 2 (two) times daily. 1 Inhaler 5  . colchicine 0.6 MG tablet Take 1 tablet (0.6 mg total) by mouth 2 (two) times daily. 60 tablet 0  . cyclobenzaprine (FLEXERIL) 10 MG tablet Take 1 tablet (10 mg total) by mouth 3 (three) times daily as needed for muscle spasms. 30 tablet 0  . docusate sodium (COLACE) 100 MG capsule Take 1 capsule (100 mg total) by mouth daily. 10 capsule 0  . escitalopram (LEXAPRO) 20 MG tablet Take 1 tablet (20 mg total) by mouth daily. 90 tablet 3  . furosemide (LASIX) 40 MG tablet Take 1 tablet (40 mg total) by mouth daily. 40 tablet 0  .  guaiFENesin-dextromethorphan (ROBITUSSIN DM) 100-10 MG/5ML syrup Take 5 mLs by mouth every 6 (six) hours as needed for cough. 118 mL 0  . levothyroxine (SYNTHROID, LEVOTHROID) 150 MCG tablet Take 1 tablet (150 mcg total) by mouth daily. 90 tablet 2  . Multiple Vitamins-Minerals (MULTIVITAMINS THER. W/MINERALS) TABS Take 1 tablet by mouth daily.    . nortriptyline (PAMELOR) 75 MG capsule Take 1 capsule (75 mg total) by mouth at bedtime. 90 capsule 1  . polyethylene glycol (MIRALAX / GLYCOLAX) packet Take 17 g by mouth daily. 14 each 0  . propranolol (INDERAL) 10 MG tablet TAKE 1 TABLET (10 MG TOTAL) BY MOUTH 2 (TWO) TIMES DAILY. 60 tablet 3  . traMADol (ULTRAM) 50 MG tablet Take 1 tablet (50 mg total) by mouth every 6 (six) hours as needed  for moderate pain. 30 tablet 2  . traZODone (DESYREL) 50 MG tablet TAKE ONE HALF TO ONE TABLET BY MOUTH EVERY NIGHT AT BEDTIME AS NEEDED FOR SLEEP 90 tablet 1   Objective: BP 106/68 (BP Location: Left Arm, Patient Position: Sitting, Cuff Size: Large)   Pulse 98   Temp 98.9 F (37.2 C) (Oral)   Ht 5\' 11"  (1.803 m)   Wt 270 lb 3.2 oz (122.6 kg)   SpO2 95%   BMI 37.69 kg/m  Gen: NAD, resting comfortably Right TM mild erythema, normal left TM. Pharynx largely normal CV: RRR no murmurs rubs or gallops Lungs: decreased breath sounds left base, faint crackles and rhonchi in right lung base. Otherwise CTAB Abdomen: soft/nontender/nondistended/normal bowel sounds. No rebound or guarding.  Ext: no edema Skin: warm, dry  Assessment/Plan:  Chronic pulmonary embolism ([place]) Continue patient on long-term Eliquis.  Also covers atrial flutter risk for stroke.  Atrial flutter [facility]) Patient appears to be maintaining sinus rhythm after prior ablation in August.  Continue Eliquis.  Continue propranolol-mainly for essential tremor at this point but would provide rate control if converted back to atrial flutter  (HFpEF) heart failure with preserved ejection fraction (HCC) Continue Lasix 40 mg.  Discussed getting bmet today.  Patient would prefer to follow-up in 1 week due to large number of blood draws in hospital.  We will also get CBC at that time  Asthmatic bronchitis Continue Symbicort.  Has not been noted as COPD by pulmonology so will not document as such-had been noted as COPD by hospitalist.  Chronic kidney disease (CKD), stage III (moderate) ([place]) Creatinine stable at baseline around 1.4-1.5-we will recheck next week.  This is with unilateral kidney so overall is excellent.  Follow-up in approximately a week advised-appears as scheduled for 10 days Future Appointments  Date Time Provider [place]  04/22/2018  3:30 PM Marin Olp, MD [facility]  05/03/2018 11:15 AM Constance Haw, MD [facility]  05/17/2018  3:00 PM Jettie Booze, MD [facility]  05/18/2018 10:20 AM Jamse Arn, MD [facility]   Lab/Order associations: Chronic heart failure with preserved ejection fraction ([place])  Other chronic pulmonary embolism without acute cor pulmonale (HCC)  Atrial flutter, unspecified type (HCC)  Moderate persistent asthmatic bronchitis without complication  Chronic kidney disease (CKD), stage III (moderate) (HCC)  Coronary artery disease involving native heart without angina pectoris, unspecified vessel or lesion type  Chronic respiratory failure with hypoxia ([place])  Return precautions advised.  [name], MD

## 2018-04-12 NOTE — Assessment & Plan Note (Signed)
Continue Lasix 40 mg.  Discussed getting bmet today.  Patient would prefer to follow-up in 1 week due to large number of blood draws in hospital.  We will also get CBC at that time

## 2018-04-12 NOTE — Patient Instructions (Addendum)
Schedule a follow up visit for 1 week with me. Lets check your kidneys and potassium at that time. Lets postpone your flu shot and shingles shot for at least a week-perhaps do them on the same day next week with flu shot here and she will shot at pharmacy  I want you to come back sooner if any worsening shortness of breath  I would love for you to let me know how often you are needing your oxygen at home at that point.  I sent an albuterol inhaler in for you.  Let me know if your ear does not continue to improve- I am willing to send in Augmentin antibiotic for ear infection if needed

## 2018-04-12 NOTE — Assessment & Plan Note (Signed)
Creatinine stable at baseline around 1.4-1.5-we will recheck next week.  This is with unilateral kidney so overall is excellent.

## 2018-04-12 NOTE — Assessment & Plan Note (Addendum)
Continue patient on long-term Eliquis.  Also covers atrial flutter risk for stroke.

## 2018-04-13 DIAGNOSIS — Z86711 Personal history of pulmonary embolism: Secondary | ICD-10-CM | POA: Diagnosis not present

## 2018-04-13 DIAGNOSIS — Z22322 Carrier or suspected carrier of Methicillin resistant Staphylococcus aureus: Secondary | ICD-10-CM | POA: Diagnosis not present

## 2018-04-13 DIAGNOSIS — I4891 Unspecified atrial fibrillation: Secondary | ICD-10-CM | POA: Diagnosis not present

## 2018-04-13 DIAGNOSIS — Z905 Acquired absence of kidney: Secondary | ICD-10-CM | POA: Diagnosis not present

## 2018-04-13 DIAGNOSIS — I129 Hypertensive chronic kidney disease with stage 1 through stage 4 chronic kidney disease, or unspecified chronic kidney disease: Secondary | ICD-10-CM | POA: Diagnosis not present

## 2018-04-13 DIAGNOSIS — Z79891 Long term (current) use of opiate analgesic: Secondary | ICD-10-CM | POA: Diagnosis not present

## 2018-04-13 DIAGNOSIS — Z96653 Presence of artificial knee joint, bilateral: Secondary | ICD-10-CM | POA: Diagnosis not present

## 2018-04-13 DIAGNOSIS — G4733 Obstructive sleep apnea (adult) (pediatric): Secondary | ICD-10-CM | POA: Diagnosis not present

## 2018-04-13 DIAGNOSIS — M109 Gout, unspecified: Secondary | ICD-10-CM | POA: Diagnosis not present

## 2018-04-13 DIAGNOSIS — Z85528 Personal history of other malignant neoplasm of kidney: Secondary | ICD-10-CM | POA: Diagnosis not present

## 2018-04-13 DIAGNOSIS — I2510 Atherosclerotic heart disease of native coronary artery without angina pectoris: Secondary | ICD-10-CM | POA: Diagnosis not present

## 2018-04-13 DIAGNOSIS — Z87891 Personal history of nicotine dependence: Secondary | ICD-10-CM | POA: Diagnosis not present

## 2018-04-13 DIAGNOSIS — M1990 Unspecified osteoarthritis, unspecified site: Secondary | ICD-10-CM | POA: Diagnosis not present

## 2018-04-13 DIAGNOSIS — N183 Chronic kidney disease, stage 3 (moderate): Secondary | ICD-10-CM | POA: Diagnosis not present

## 2018-04-13 DIAGNOSIS — Z8619 Personal history of other infectious and parasitic diseases: Secondary | ICD-10-CM | POA: Diagnosis not present

## 2018-04-13 DIAGNOSIS — Z7901 Long term (current) use of anticoagulants: Secondary | ICD-10-CM | POA: Diagnosis not present

## 2018-04-13 DIAGNOSIS — J449 Chronic obstructive pulmonary disease, unspecified: Secondary | ICD-10-CM | POA: Diagnosis not present

## 2018-04-14 ENCOUNTER — Ambulatory Visit: Payer: Self-pay | Admitting: *Deleted

## 2018-04-14 ENCOUNTER — Inpatient Hospital Stay (HOSPITAL_COMMUNITY)
Admission: EM | Admit: 2018-04-14 | Discharge: 2018-04-20 | DRG: 871 | Disposition: A | Discharge status: Home Health | Payer: Medicare Other | Attending: Internal Medicine | Admitting: Internal Medicine

## 2018-04-14 ENCOUNTER — Emergency Department (HOSPITAL_COMMUNITY): Payer: Medicare Other

## 2018-04-14 ENCOUNTER — Other Ambulatory Visit: Payer: Self-pay

## 2018-04-14 ENCOUNTER — Telehealth: Payer: Self-pay | Admitting: Interventional Cardiology

## 2018-04-14 ENCOUNTER — Encounter (HOSPITAL_COMMUNITY): Payer: Self-pay

## 2018-04-14 DIAGNOSIS — I130 Hypertensive heart and chronic kidney disease with heart failure and stage 1 through stage 4 chronic kidney disease, or unspecified chronic kidney disease: Secondary | ICD-10-CM | POA: Diagnosis not present

## 2018-04-14 DIAGNOSIS — Z888 Allergy status to other drugs, medicaments and biological substances status: Secondary | ICD-10-CM

## 2018-04-14 DIAGNOSIS — D721 Eosinophilia: Secondary | ICD-10-CM | POA: Diagnosis not present

## 2018-04-14 DIAGNOSIS — J189 Pneumonia, unspecified organism: Secondary | ICD-10-CM | POA: Diagnosis not present

## 2018-04-14 DIAGNOSIS — Z87891 Personal history of nicotine dependence: Secondary | ICD-10-CM

## 2018-04-14 DIAGNOSIS — A419 Sepsis, unspecified organism: Principal | ICD-10-CM | POA: Diagnosis present

## 2018-04-14 DIAGNOSIS — D72829 Elevated white blood cell count, unspecified: Secondary | ICD-10-CM | POA: Diagnosis not present

## 2018-04-14 DIAGNOSIS — J9811 Atelectasis: Secondary | ICD-10-CM | POA: Diagnosis not present

## 2018-04-14 DIAGNOSIS — I10 Essential (primary) hypertension: Secondary | ICD-10-CM | POA: Diagnosis present

## 2018-04-14 DIAGNOSIS — Z85528 Personal history of other malignant neoplasm of kidney: Secondary | ICD-10-CM

## 2018-04-14 DIAGNOSIS — I5033 Acute on chronic diastolic (congestive) heart failure: Secondary | ICD-10-CM | POA: Diagnosis not present

## 2018-04-14 DIAGNOSIS — Z96653 Presence of artificial knee joint, bilateral: Secondary | ICD-10-CM | POA: Diagnosis present

## 2018-04-14 DIAGNOSIS — J45909 Unspecified asthma, uncomplicated: Secondary | ICD-10-CM | POA: Diagnosis present

## 2018-04-14 DIAGNOSIS — Z7901 Long term (current) use of anticoagulants: Secondary | ICD-10-CM | POA: Diagnosis not present

## 2018-04-14 DIAGNOSIS — M1990 Unspecified osteoarthritis, unspecified site: Secondary | ICD-10-CM | POA: Diagnosis present

## 2018-04-14 DIAGNOSIS — Y95 Nosocomial condition: Secondary | ICD-10-CM | POA: Diagnosis present

## 2018-04-14 DIAGNOSIS — N183 Chronic kidney disease, stage 3 unspecified: Secondary | ICD-10-CM

## 2018-04-14 DIAGNOSIS — F329 Major depressive disorder, single episode, unspecified: Secondary | ICD-10-CM | POA: Diagnosis present

## 2018-04-14 DIAGNOSIS — Z9889 Other specified postprocedural states: Secondary | ICD-10-CM

## 2018-04-14 DIAGNOSIS — R0600 Dyspnea, unspecified: Secondary | ICD-10-CM

## 2018-04-14 DIAGNOSIS — Z6838 Body mass index (BMI) 38.0-38.9, adult: Secondary | ICD-10-CM | POA: Diagnosis not present

## 2018-04-14 DIAGNOSIS — M109 Gout, unspecified: Secondary | ICD-10-CM | POA: Diagnosis present

## 2018-04-14 DIAGNOSIS — I4891 Unspecified atrial fibrillation: Secondary | ICD-10-CM | POA: Diagnosis not present

## 2018-04-14 DIAGNOSIS — Z22322 Carrier or suspected carrier of Methicillin resistant Staphylococcus aureus: Secondary | ICD-10-CM | POA: Diagnosis not present

## 2018-04-14 DIAGNOSIS — R0902 Hypoxemia: Secondary | ICD-10-CM

## 2018-04-14 DIAGNOSIS — E039 Hypothyroidism, unspecified: Secondary | ICD-10-CM | POA: Diagnosis not present

## 2018-04-14 DIAGNOSIS — J449 Chronic obstructive pulmonary disease, unspecified: Secondary | ICD-10-CM | POA: Diagnosis not present

## 2018-04-14 DIAGNOSIS — Z8546 Personal history of malignant neoplasm of prostate: Secondary | ICD-10-CM

## 2018-04-14 DIAGNOSIS — D75839 Thrombocytosis, unspecified: Secondary | ICD-10-CM

## 2018-04-14 DIAGNOSIS — J90 Pleural effusion, not elsewhere classified: Secondary | ICD-10-CM | POA: Diagnosis not present

## 2018-04-14 DIAGNOSIS — G629 Polyneuropathy, unspecified: Secondary | ICD-10-CM | POA: Diagnosis present

## 2018-04-14 DIAGNOSIS — I440 Atrioventricular block, first degree: Secondary | ICD-10-CM | POA: Diagnosis not present

## 2018-04-14 DIAGNOSIS — N182 Chronic kidney disease, stage 2 (mild): Secondary | ICD-10-CM

## 2018-04-14 DIAGNOSIS — E871 Hypo-osmolality and hyponatremia: Secondary | ICD-10-CM

## 2018-04-14 DIAGNOSIS — Z8614 Personal history of Methicillin resistant Staphylococcus aureus infection: Secondary | ICD-10-CM

## 2018-04-14 DIAGNOSIS — I2510 Atherosclerotic heart disease of native coronary artery without angina pectoris: Secondary | ICD-10-CM | POA: Diagnosis present

## 2018-04-14 DIAGNOSIS — Z905 Acquired absence of kidney: Secondary | ICD-10-CM

## 2018-04-14 DIAGNOSIS — R5381 Other malaise: Secondary | ICD-10-CM

## 2018-04-14 DIAGNOSIS — G4733 Obstructive sleep apnea (adult) (pediatric): Secondary | ICD-10-CM | POA: Diagnosis not present

## 2018-04-14 DIAGNOSIS — R0602 Shortness of breath: Secondary | ICD-10-CM | POA: Diagnosis present

## 2018-04-14 DIAGNOSIS — Z8701 Personal history of pneumonia (recurrent): Secondary | ICD-10-CM

## 2018-04-14 DIAGNOSIS — G8929 Other chronic pain: Secondary | ICD-10-CM | POA: Diagnosis not present

## 2018-04-14 DIAGNOSIS — I129 Hypertensive chronic kidney disease with stage 1 through stage 4 chronic kidney disease, or unspecified chronic kidney disease: Secondary | ICD-10-CM | POA: Diagnosis not present

## 2018-04-14 DIAGNOSIS — Z79891 Long term (current) use of opiate analgesic: Secondary | ICD-10-CM | POA: Diagnosis not present

## 2018-04-14 DIAGNOSIS — G894 Chronic pain syndrome: Secondary | ICD-10-CM

## 2018-04-14 DIAGNOSIS — F419 Anxiety disorder, unspecified: Secondary | ICD-10-CM | POA: Diagnosis present

## 2018-04-14 DIAGNOSIS — Z8619 Personal history of other infectious and parasitic diseases: Secondary | ICD-10-CM | POA: Diagnosis not present

## 2018-04-14 DIAGNOSIS — E669 Obesity, unspecified: Secondary | ICD-10-CM | POA: Diagnosis present

## 2018-04-14 DIAGNOSIS — J9601 Acute respiratory failure with hypoxia: Secondary | ICD-10-CM

## 2018-04-14 DIAGNOSIS — Z8051 Family history of malignant neoplasm of kidney: Secondary | ICD-10-CM

## 2018-04-14 DIAGNOSIS — Z79899 Other long term (current) drug therapy: Secondary | ICD-10-CM

## 2018-04-14 DIAGNOSIS — I4892 Unspecified atrial flutter: Secondary | ICD-10-CM | POA: Diagnosis not present

## 2018-04-14 DIAGNOSIS — I313 Pericardial effusion (noninflammatory): Secondary | ICD-10-CM | POA: Diagnosis present

## 2018-04-14 DIAGNOSIS — Z800 Family history of malignant neoplasm of digestive organs: Secondary | ICD-10-CM

## 2018-04-14 DIAGNOSIS — F32A Depression, unspecified: Secondary | ICD-10-CM

## 2018-04-14 DIAGNOSIS — Z87442 Personal history of urinary calculi: Secondary | ICD-10-CM

## 2018-04-14 DIAGNOSIS — Z7951 Long term (current) use of inhaled steroids: Secondary | ICD-10-CM

## 2018-04-14 DIAGNOSIS — Z86711 Personal history of pulmonary embolism: Secondary | ICD-10-CM

## 2018-04-14 DIAGNOSIS — Z79890 Hormone replacement therapy (postmenopausal): Secondary | ICD-10-CM

## 2018-04-14 DIAGNOSIS — D473 Essential (hemorrhagic) thrombocythemia: Secondary | ICD-10-CM

## 2018-04-14 LAB — BASIC METABOLIC PANEL
Anion gap: 12 (ref 5–15)
BUN: 17 mg/dL (ref 8–23)
CHLORIDE: 95 mmol/L — AB (ref 98–111)
CO2: 23 mmol/L (ref 22–32)
CREATININE: 1.38 mg/dL — AB (ref 0.61–1.24)
Calcium: 8.5 mg/dL — ABNORMAL LOW (ref 8.9–10.3)
GFR calc Af Amer: 56 mL/min — ABNORMAL LOW (ref >60)
GFR, EST NON AFRICAN AMERICAN: 48 mL/min — AB (ref >60)
GLUCOSE: 88 mg/dL (ref 70–99)
Potassium: 4 mmol/L (ref 3.5–5.1)
SODIUM: 130 mmol/L — AB (ref 135–145)

## 2018-04-14 LAB — BRAIN NATRIURETIC PEPTIDE: B Natriuretic Peptide: 178 pg/mL — ABNORMAL HIGH (ref 0.0–100.0)

## 2018-04-14 LAB — I-STAT TROPONIN, ED: Troponin i, poc: 0.03 ng/mL (ref 0.00–0.08)

## 2018-04-14 LAB — CBC
HEMATOCRIT: 45.1 % (ref 39.0–52.0)
Hemoglobin: 14.7 g/dL (ref 13.0–17.0)
MCH: 28.9 pg (ref 26.0–34.0)
MCHC: 32.6 g/dL (ref 30.0–36.0)
MCV: 88.8 fL (ref 80.0–100.0)
Platelets: 468 10*3/uL — ABNORMAL HIGH (ref 150–400)
RBC: 5.08 MIL/uL (ref 4.22–5.81)
RDW: 15.3 % (ref 11.5–15.5)
WBC: 21.4 10*3/uL — AB (ref 4.0–10.5)
nRBC: 0 % (ref 0.0–0.2)

## 2018-04-14 LAB — CULTURE, BODY FLUID W GRAM STAIN -BOTTLE: Culture: NO GROWTH

## 2018-04-14 MED ORDER — VANCOMYCIN HCL 10 G IV SOLR
2500.0000 mg | Freq: Once | INTRAVENOUS | Status: AC
  Administered 2018-04-14: 2500 mg via INTRAVENOUS
  Filled 2018-04-14: qty 2500

## 2018-04-14 MED ORDER — SODIUM CHLORIDE 0.9 % IV SOLN
1.0000 g | Freq: Once | INTRAVENOUS | Status: DC
  Filled 2018-04-14: qty 1

## 2018-04-14 MED ORDER — SODIUM CHLORIDE 0.9 % IV SOLN
2.0000 g | Freq: Once | INTRAVENOUS | Status: AC
  Administered 2018-04-14: 2 g via INTRAVENOUS
  Filled 2018-04-14: qty 2

## 2018-04-14 MED ORDER — ALBUTEROL SULFATE HFA 108 (90 BASE) MCG/ACT IN AERS: 2.0000 | INHALATION_SPRAY | Freq: Four times a day (QID) | RESPIRATORY_TRACT | 3 refills | Status: DC | PRN

## 2018-04-14 MED ORDER — SODIUM CHLORIDE 0.9 % IV SOLN
2.0000 g | Freq: Two times a day (BID) | INTRAVENOUS | Status: DC
  Administered 2018-04-15 – 2018-04-19 (×10): 2 g via INTRAVENOUS
  Filled 2018-04-14 (×11): qty 2

## 2018-04-14 MED ORDER — SODIUM CHLORIDE 0.9 % IV BOLUS
500.0000 mL | Freq: Once | INTRAVENOUS | Status: AC
  Administered 2018-04-14: 500 mL via INTRAVENOUS
  Administered 2018-04-14: 22:00:00 via INTRAVENOUS

## 2018-04-14 MED ORDER — IPRATROPIUM-ALBUTEROL 0.5-2.5 (3) MG/3ML IN SOLN
3.0000 mL | Freq: Once | RESPIRATORY_TRACT | Status: AC
  Administered 2018-04-14: 3 mL via RESPIRATORY_TRACT
  Filled 2018-04-14: qty 3

## 2018-04-14 MED ORDER — VANCOMYCIN HCL 10 G IV SOLR
1500.0000 mg | INTRAVENOUS | Status: DC
  Administered 2018-04-15 – 2018-04-17 (×3): 1500 mg via INTRAVENOUS
  Filled 2018-04-14 (×3): qty 1500

## 2018-04-14 NOTE — Telephone Encounter (Signed)
Patient was recently in the hospital for acute hypoxemic respiratory failure due to cardiogenic pulmonary edema due to decompensated heart failure with preserved ejection fraction, complicated by left pleural effusion requiring thoracentesis.  Clair Gulling calling from [facility]. He states that the patient is very weak and SOB. He states that the patient's BP sitting is 110/70 and standing 80/60. He states that his HR is 80-90s. Patient's temp is 99.6. He states that the patient has a cough and has increased SOB and chest pain on exertion. He states that the patient's SpO2 is 99% on 3L [place]. He states that the patient has +2 pitting edema in his lower extremities. He states that his weight is up 1 pound from yesterday. He states that the nurse was in the home yesterday and noted that the patient's breath sounds were diminished in the left lower lobe and clear in the upper lobes. Clair Gulling states that the patient's breath sounds on the left side are now diminished throughout. He states that the patient also has a cough. Patient is taking lasix 40 mg QD and has used his inhaler. Spoke to the patient over the phone. Patient can only say 2 words at a time before having to stop and take a deep breath. Instructed for the patient to be evaluated in the ER.

## 2018-04-14 NOTE — ED Triage Notes (Signed)
Pt endorses ongoing shob since last week. Pt admitted and had 1L of fluid drained off of lung. Denies CP. Also recently admitted for PNA. 91% on RA, placed on 2L [place]. Pt was just placed on PRN o2 at home yesterday and has used it most of the day.

## 2018-04-14 NOTE — Progress Notes (Addendum)
Pharmacy Antibiotic Note  Wayne Booth is a 77 y.o. male admitted on 04/14/2018 with pneumonia.  Pt has had recurrent pneumonias and has been admitted to ICU on ventilator in past year. Recently hospitalized on 04/08/18 for hypoxia and pleural effusion. Pharmacy has been consulted for Vancomycin and Cefepime dosing. WBC - 21.4, afebrile, Scr-1.38  Plan: Cefepime 2g x1, then Cefepime 2g q12h Vancomycin 2500mg  x1, then Vancomycin 1500mg  q24h Monitor and adjust per renal fx, clinical status, C&S, vanc troughs as needed   Height: 5\' 11"  (180.3 cm) Weight: 273 lb (123.8 kg) IBW/kg (Calculated) : 75.3  Temp (24hrs), Avg:99.1 F (37.3 C), Min:99.1 F (37.3 C), Max:99.1 F (37.3 C)  Recent Labs  Lab 04/08/18 2130 04/09/18 0247 04/09/18 1532 04/10/18 0202 04/11/18 0214 04/14/18 1808  WBC 12.2* 13.2*  --  15.4*  --  21.4*  CREATININE 1.45*  --  1.29* 1.35* 1.42* 1.38*  LATICACIDVEN  --  1.2  --   --   --   --     Estimated Creatinine Clearance: 62 mL/min (A) (by C-G formula based on SCr of 1.38 mg/dL (H)).    Allergies  Allergen Reactions  . Ambien [Zolpidem] Anxiety    Anxiety and agitation when tried as sleeper in hospital aug 2019    Antimicrobials this admission: Vancomycin 10/17 >>  Cefepime 10/17 >>   Dose adjustments this admission: N/A  Microbiology results: Pending  Thank you for allowing pharmacy to be a part of this patient's care.  Tyson Babinski 04/14/2018 8:56 PM

## 2018-04-14 NOTE — Telephone Encounter (Signed)
  Albuterol 2 puffs every 6 hours as needed for wheezing.  Patient does not have printed RX as stated prescribed 03/04/18. 04/12/18 OV notes noted Dr. Yong Channel to send in prescription. Ordering at this time-pharmacy reporting no refill on file. [name] on [address].

## 2018-04-14 NOTE — H&P (Signed)
History and Physical    CASH DUCE JQB:[id number] DOB: [birth year] DOA: 04/14/2018  PCP: Marin Olp, MD Patient coming from: Home  Chief Complaint: Shortness of breath  HPI: Wayne Booth is a 77 y.o. male with medical history significant of A. fib, coronary artery disease, hypertension, hypothyroidism, recurrent pneumonias over the past year and has been admitted to the ICU on the ventilator.  Patient is presenting for further evaluation of progressively worse dyspnea on exertion.  States he has been having dyspnea on exertion since his recent hospital discharge.  Also reports having orthopnea.  Denies having any weight gain or lower extremity edema.  States he was sent home on 2 L of oxygen via nasal cannula but his oxygen requirement increased to 3 L yesterday evening.  He has been having some cough with minimal white-colored sputum production.  Reports having chest tightness whenever he coughs or is gasping for air.  Denies having any fevers, chills, nausea, or vomiting.  History obtained from chart review: Patient was hospitalized from October 11 to October 14 for acute hypoxemic respiratory failure due to cardiogenic pulmonary edema due to decompensated heart failure with preserved ejection fraction, complicated by bilateral pleural effusions, worse on the left thought to be related to prior pneumonia.  He was treated with IV diuresis and underwent an ultrasound-guided thoracentesis.  He was discharged home on 2 L of oxygen via nasal cannula. No antibiotics were prescribed at this hospitalization.  Cultures from pleural fluid showed no growth.  Patient was hospitalized in the recent months for MRSA pneumonia and discharged to inpatient rehab on August 31.  ED Course: On arrival to the ED, afebrile pulse 98, respiratory rate 18, blood pressure 96/57, and SPO2 95% on 2 L of oxygen via nasal cannula.  Labs showing white count 21.4; was 15.4 five days ago.  BNP 178.  I-STAT troponin  negative.  EKG showing sinus rhythm (heart rate 99), first-degree AV block which is new compared to prior EKG, and nonspecific T wave abnormality.  Chest x-ray showing small right pleural effusion and small to moderate left pleural effusion, possible slight increase on the left side.  Also showing persistent consolidation at the lingula and left base.  Patient received IV vancomycin, IV cefepime, and total 1L bolus of normal saline in the ED.  TRH paged to admit.  Review of Systems: As per HPI otherwise 10 point review of systems negative.  Past Medical History:  Diagnosis Date  . Allergy    States his nose runs when he is around grease.   Marland Kitchen Anxiety   . Arthritis   . Atrial fibrillation ([place])   . Cancer ([place])   . Coronary artery disease   . History of total knee replacement   . Hypertension   . Hypothyroidism   . Kidney cancer, primary, with metastasis from kidney to other site [facility])   . Kidney stone   . OSA (obstructive sleep apnea)    pt does not wear cpap at night  . Pituitary cyst ([place])   . Pneumonia    hx of  . Prostate cancer [facility])     Past Surgical History:  Procedure Laterality Date  . ABLATION OF DYSRHYTHMIC FOCUS  01/28/2018  . ANTERIOR CERVICAL DECOMP/DISCECTOMY FUSION N/A 08/29/2012   Procedure: ANTERIOR CERVICAL DECOMPRESSION/DISCECTOMY FUSION 1 LEVEL;  Surgeon: Eustace Moore, MD;  Location: [place] NEURO ORS;  Service: Neurosurgery;  Laterality: N/A;  . APPENDECTOMY    . ATRIAL FIBRILLATION ABLATION N/A 01/28/2018  Procedure: ATRIAL FIBRILLATION ABLATION;  Surgeon: Constance Haw, MD;  Location: [place] CV LAB;  Service: Cardiovascular;  Laterality: N/A;  . CARDIAC CATHETERIZATION  2008   clean  . CHOLECYSTECTOMY    . COLONOSCOPY W/ POLYPECTOMY    . CRANIOTOMY N/A 11/08/2012   Procedure: CRANIOTOMY HYPOPHYSECTOMY TRANSNASAL APPROACH;  Surgeon: Faythe Ghee, MD;  Location: [place] NEURO ORS;  Service: Neurosurgery;  Laterality: N/A;  Transphenoidal Resection of  Pituitary Tumor  . FINGER SURGERY Left 2017   Dr Amedeo Plenty  . INSERTION PROSTATE RADIATION SEED    . KIDNEY STONE SURGERY    . KNEE ARTHROSCOPY  2007   left  . NEPHRECTOMY Left   . POSTERIOR CERVICAL LAMINECTOMY Left 04/24/2015   Procedure: Foraminotomy cervical five - cervical six cervical six - cervical seven left;  Surgeon: Eustace Moore, MD;  Location: [facility] NEURO ORS;  Service: Neurosurgery;  Laterality: Left;  . REPLACEMENT TOTAL KNEE BILATERAL       reports that he has quit smoking. His smoking use included cigars. He quit after 4.00 years of use. He has never used smokeless tobacco. He reports that he drinks about 2.0 standard drinks of alcohol per week. He reports that he does not use drugs.  Allergies  Allergen Reactions  . Ambien [Zolpidem] Anxiety    Anxiety and agitation when tried as sleeper in hospital aug 2019    Family History  Problem Relation Age of Onset  . Cancer Father        ?mouth, died before patient born  . Cancer Mother        stomach, died when he was young  . Kidney cancer Sister   . Colon cancer Brother 33  . Esophageal cancer Neg Hx   . Rectal cancer Neg Hx   . Stomach cancer Neg Hx     Prior to Admission medications   Medication Sig Start Date End Date Taking? Authorizing Provider  albuterol (PROVENTIL HFA;VENTOLIN HFA) 108 (90 Base) MCG/ACT inhaler Inhale 2 puffs into the lungs every 6 (six) hours as needed for wheezing or shortness of breath. 04/14/18   Marin Olp, MD  allopurinol (ZYLOPRIM) 100 MG tablet Take 1 tablet (100 mg total) by mouth daily. 03/08/18   Marin Olp, MD  apixaban (ELIQUIS) 5 MG TABS tablet Take 1 tablet (5 mg total) by mouth 2 (two) times daily. 03/08/18   Marin Olp, MD  budesonide-formoterol [facility]) 160-4.5 MCG/ACT inhaler Inhale 2 puffs into the lungs 2 (two) times daily. 03/09/18   Marin Olp, MD  colchicine 0.6 MG tablet Take 1 tablet (0.6 mg total) by mouth 2 (two) times daily. 03/04/18    Angiulli, Lavon Paganini, PA-C  cyclobenzaprine (FLEXERIL) 10 MG tablet Take 1 tablet (10 mg total) by mouth 3 (three) times daily as needed for muscle spasms. 03/04/18   Angiulli, Lavon Paganini, PA-C  docusate sodium (COLACE) 100 MG capsule Take 1 capsule (100 mg total) by mouth daily. 02/26/18   Isaac Bliss, Rayford Halsted, MD  escitalopram (LEXAPRO) 20 MG tablet Take 1 tablet (20 mg total) by mouth daily. 03/04/18   Angiulli, Lavon Paganini, PA-C  furosemide (LASIX) 40 MG tablet Take 1 tablet (40 mg total) by mouth daily. 04/11/18 05/21/18  Arrien, Jimmy Picket, MD  guaiFENesin-dextromethorphan (ROBITUSSIN DM) 100-10 MG/5ML syrup Take 5 mLs by mouth every 6 (six) hours as needed for cough. 04/11/18   Arrien, Jimmy Picket, MD  levothyroxine (SYNTHROID, LEVOTHROID) 150 MCG tablet Take 1 tablet (150  mcg total) by mouth daily. 03/04/18   Angiulli, Lavon Paganini, PA-C  Multiple Vitamins-Minerals (MULTIVITAMINS THER. W/MINERALS) TABS Take 1 tablet by mouth daily.    [provider]  nortriptyline (PAMELOR) 75 MG capsule Take 1 capsule (75 mg total) by mouth at bedtime. 03/04/18   Angiulli, Lavon Paganini, PA-C  polyethylene glycol (MIRALAX / GLYCOLAX) packet Take 17 g by mouth daily. 03/04/18   Angiulli, Lavon Paganini, PA-C  propranolol (INDERAL) 10 MG tablet TAKE 1 TABLET (10 MG TOTAL) BY MOUTH 2 (TWO) TIMES DAILY. 03/17/18   Marin Olp, MD  traMADol (ULTRAM) 50 MG tablet Take 1 tablet (50 mg total) by mouth every 6 (six) hours as needed for moderate pain. 03/08/18   Marin Olp, MD  traZODone (DESYREL) 50 MG tablet TAKE ONE HALF TO ONE TABLET BY MOUTH EVERY NIGHT AT BEDTIME AS NEEDED FOR SLEEP 03/29/18   Marin Olp, MD    Physical Exam: Vitals:   04/14/18 2200 04/14/18 2220 04/14/18 2300 04/15/18 0400  BP:  122/63 114/82 94/63  Pulse:  100 96 97  Resp:  20 (!) 25 (!) 30  Temp:  97.9 F (36.6 C) 98.2 F (36.8 C) 98.2 F (36.8 C)  TempSrc:  Oral Oral Oral  SpO2:  95% 93% 96%  Weight: 123 kg     Height:  5\' 11"  (1.803 m)      Physical Exam  Constitutional: He is oriented to person, place, and time. He appears well-developed and well-nourished. No distress.  Sitting up in a bedside chair  HENT:  Head: Normocephalic and atraumatic.  Mouth/Throat: Oropharynx is clear and moist.  Eyes: Right eye exhibits no discharge. Left eye exhibits no discharge.  Neck: Neck supple. No tracheal deviation present.  Cardiovascular: Normal rate, regular rhythm and intact distal pulses.  Pulmonary/Chest: Effort normal.  Basilar rales and decreased breath sounds (L >R) Speaking comfortably in full sentences On 2L supplemental oxygen via nasal cannula.  Abdominal: Soft. Bowel sounds are normal. He exhibits no distension. There is no tenderness.  Musculoskeletal:  +2 pitting edema of bilateral lower extremities  Neurological: He is alert and oriented to person, place, and time.  Skin: Skin is warm and dry.  Psychiatric: His behavior is normal.   Labs on Admission: I have personally reviewed following labs and imaging studies  CBC: Recent Labs  Lab 04/08/18 2130 04/09/18 0247 04/10/18 0202 04/14/18 1808 04/15/18 0242  WBC 12.2* 13.2* 15.4* 21.4* 20.3*  NEUTROABS  --   --  12.5*  --   --   HGB 14.0 13.6 13.6 14.7 14.2  HCT 45.2 43.1 41.9 45.1 43.2  MCV 91.7 90.5 89.1 88.8 89.3  PLT 399 368 375 468* 989*   Basic Metabolic Panel: Recent Labs  Lab 04/08/18 2130 04/09/18 1532 04/10/18 0202 04/11/18 0214 04/14/18 1808  NA 133* 134* 133* 136 130*  K 3.7 5.1 3.9 4.1 4.0  CL 98 101 100 101 95*  CO2 25 26 22 27 23   GLUCOSE 114* 154* 153* 91 88  BUN 17 16 17 19 17   CREATININE 1.45* 1.29* 1.35* 1.42* 1.38*  CALCIUM 8.8* 8.9 8.8* 8.5* 8.5*   GFR: Estimated Creatinine Clearance: 61.8 mL/min (A) (by C-G formula based on SCr of 1.38 mg/dL (H)). Liver Function Tests: Recent Labs  Lab 04/08/18 2130  AST 21  ALT 18  ALKPHOS 60  BILITOT 0.5  PROT 6.5  ALBUMIN 3.1*   Recent Labs  Lab  04/08/18 2130  LIPASE 42  No results for input(s): AMMONIA in the last 168 hours. Coagulation Profile: No results for input(s): INR, PROTIME in the last 168 hours. Cardiac Enzymes: No results for input(s): CKTOTAL, CKMB, CKMBINDEX, TROPONINI in the last 168 hours. BNP (last 3 results) No results for input(s): PROBNP in the last 8760 hours. HbA1C: No results for input(s): HGBA1C in the last 72 hours. CBG: No results for input(s): GLUCAP in the last 168 hours. Lipid Profile: No results for input(s): CHOL, HDL, LDLCALC, TRIG, CHOLHDL, LDLDIRECT in the last 72 hours. Thyroid Function Tests: Recent Labs    04/15/18 0242  TSH 0.824   Anemia Panel: No results for input(s): VITAMINB12, FOLATE, FERRITIN, TIBC, IRON, RETICCTPCT in the last 72 hours. Urine analysis:    Component Value Date/Time   COLORURINE YELLOW 04/08/2018 2126   APPEARANCEUR CLEAR 04/08/2018 2126   LABSPEC 1.015 04/08/2018 2126   PHURINE 5.0 04/08/2018 2126   GLUCOSEU NEGATIVE 04/08/2018 2126   HGBUR NEGATIVE 04/08/2018 2126   HGBUR 2+ 09/22/2007 1444   BILIRUBINUR NEGATIVE 04/08/2018 2126   BILIRUBINUR n 11/07/2015 [place] 04/08/2018 2126   PROTEINUR NEGATIVE 04/08/2018 2126   UROBILINOGEN 1.0 11/07/2015 1417   UROBILINOGEN 1.0 05/22/2011 1454   NITRITE NEGATIVE 04/08/2018 2126   LEUKOCYTESUR NEGATIVE 04/08/2018 2126    Radiological Exams on Admission: Dg Chest 2 View  Result Date: 04/14/2018 CLINICAL DATA:  Shortness of breath EXAM: CHEST - 2 VIEW COMPARISON:  04/10/2018, 04/09/2018, 04/08/2018 FINDINGS: Small right pleural effusion and small to moderate left pleural effusion, may be slightly increased on the left. Persistent consolidation at the lingula and left base. Enlarged cardiomediastinal silhouette with aortic atherosclerosis. Streaky atelectasis at the right base. No pneumothorax. IMPRESSION: 1. Small right pleural effusion and small moderate left pleural effusion, possible  slight increase on the left side 2. No change in dense airspace disease at the lingula and left base. 3. Cardiomegaly Electronically Signed   By: Donavan Foil M.D.   On: 04/14/2018 19:47   Ct Chest Wo Contrast  Result Date: 04/15/2018 CLINICAL DATA:  Shortness of breath EXAM: CT CHEST WITHOUT CONTRAST TECHNIQUE: Multidetector CT imaging of the chest was performed following the standard protocol without IV contrast. COMPARISON:  Chest x-ray 04/14/2018, CT chest 02/03/2018, CT abdomen pelvis 05/22/2011 FINDINGS: Cardiovascular: Limited evaluation without intravenous contrast. Mild aortic atherosclerosis. No aneurysmal dilatation. Mild coronary vascular calcification. Borderline to mild cardiomegaly. Small pericardial effusion measuring up to 8 mm in maximum thickness anteriorly. Mediastinum/Nodes: Midline trachea. No thyroid mass. Borderline lymph nodes measuring up to 1 cm, mildly decreased in size compared to prior chest CT. Esophagus within normal limits. Lungs/Pleura: Small moderate bilateral pleural effusions, increased compared with prior CT. Consolidation within the lingula and left lower lobe as well as the right lower lobe. Scattered foci of hazy density in the upper lobes suggesting edema. 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 No acute abnormality Musculoskeletal: Degenerative changes.  No acute osseous abnormality IMPRESSION: 1. Small to moderate bilateral pleural effusions, increased as compared with CT 02/03/2018. Consolidations within the left greater than right lower lobe, and lingula which may be secondary to atelectasis or pneumonia. 2. Background hazy density in the left greater than right upper lobes, possible edema. Small pericardial effusion. Aortic Atherosclerosis (ICD10-I70.0). Electronically Signed   By: Donavan Foil M.D.   On: 04/15/2018 01:54    EKG: Independently reviewed.  Sinus rhythm (heart rate 99), first-degree AV block which is new  compared to prior  EKG, and nonspecific T wave abnormality.  Assessment/Plan Principal Problem:   Acute hypoxemic respiratory failure (HCC) Active Problems:   Essential hypertension   Asthmatic bronchitis   Hypothyroidism   Gout   Atrial flutter (HCC)   HCAP (healthcare-associated pneumonia)   CKD (chronic kidney disease) stage 3, GFR 30-59 ml/min (HCC)   Depression   Chronic pain   Pleural effusion   First degree AV block   Thrombocytosis (HCC)   Hyponatremia   History of pulmonary embolus (PE)   Physical deconditioning   Acute hypoxemic respiratory failure secondary to pleural effusions, HCAP, and acute decompensated heart failure Currently on 2 L of supplemental oxygen (same as discharge).  White count and BNP elevated since discharge. Chest x-ray showing small right pleural effusion and small to moderate left pleural effusion, possible slight increase on the left side.  Also showing persistent consolidation at the lingula and left base.  Chest CT showing small to moderate bilateral pleural effusions, increased as compared to CT done on August 8.  Consolidations within the left greater than the right lower lobe and lingula which may be secondary to atelectasis or pneumonia.  Also showing background hazy density in the left greater than right upper lobes, possible edema and small pericardial effusion.  Echo done August 2019 showing EF 50 to 55%. -Monitor in the stepdown unit -Please consult IR in the morning for thoracentesis -Continue vancomycin and cefepime.  Procalcitonin 0.32. -Continues to have orthopnea and BNP elevated since recent discharge. Giving IV Lasix 40 mg once overnight.  Continue IV Lasix 40 BID tomorrow.  -Continue to monitor renal function -CBC in a.m. -Blood culture x2 pending -Intake and output -Daily weights -Fluid restriction -Mucinex DM prn cough  First-degree AV block EKG showing first-degree AV block which is new compared to prior EKG. TSH  normal. -Continue to monitor  Thrombocytosis  Platelet count 468,000.  Previously normal. -Repeat CBC in am  Mild hyponatremia  Sodium 130. -Continue diuresis -Repeat BMP in a.m.  CKD 3 -Stable. Creatinine 1.3; at recent baseline. -Continue to monitor renal function with diuresis  Paroxysmal atrial flutter -[id number] 7.  -Currently in sinus rhythm. -Continue propranolol for rate control -Hold Eliquis overnight to reduce the risk of bleeding during thoracentesis in the morning.  Resume after the procedure.  History of PE -Hold Eliquis overnight to reduce the risk of bleeding during thoracentesis in the morning.  Resume after the procedure.  Asthmatic bronchitis  -Stable. Not wheezing. Continue home inhalers.   Hypothyroidism -TSH normal. Continue home synthroid.   HTN -Continue propranolol -IV diuresis as above  Gout -Continue home allopurinol  Chronic pain -Continue home Flexeril, tramadol  Depression -Continue home Lexapro, nortriptyline, trazodone  Physical deconditioning -PT eval  DVT prophylaxis: SCDs Code Status: Patient wishes to be full code. Family Communication: No family present at bedside. Disposition Plan: Anticipate discharge to SNF versus home in 2-3 days. Consults called: None Admission status: Inpatient It is my clinical opinion that admission to INPATIENT is reasonable and necessary in this 77 y.o. male . presenting with symptoms of DOE, concerning for Acute hypoxemic respiratory failure secondary to pleural effusions, persistent pneumonia, and acute decompensated heart failure . in the context of PMH including: CHF, recent PNA, pleural effusions  . with pertinent positives on physical exam including: Supplemental oxygen requirement . and pertinent positives on radiographic and laboratory data including: Pleural effusions, pneumonia, pulmonary edema . Workup and treatment IV diuresis, IV antibiotics, and thoracentesis  Given the  aforementioned, the predictability of  an adverse outcome is felt to be significant. I expect that the patient will require at least 2 midnights in the hospital to treat this condition.    Shela Leff MD [facility] Pager [phone]  If 7PM-7AM, please contact night-coverage [email or web address] Password [email or web address]  04/15/2018, 5:30 AM

## 2018-04-14 NOTE — ED Provider Notes (Signed)
[facility] EMERGENCY DEPARTMENT Provider Note   CSN: [id number] Arrival date & time: 04/14/18  1745     History   Chief Complaint Chief Complaint  Patient presents with  . Shortness of Breath    HPI Wayne Booth is a 77 y.o. male.  HPI Patient presents to the emergency room for evaluation of worsening shortness of breath.  Patient has had a very complicated medical course this past year.  Patient has had recurrent pneumonias, he has been admitted to the intensive care unit in on the ventilator.  Patient states at one point they thought his prognosis was very poor and they were going to withdraw care but the patient ended up recovering.  Patient most recently in the hospital on October 11.  At that time he was admitted for hypoxia and a pleural effusion.  Since symptoms were felt to be related to his pleural effusion as well as congestive heart failure.  Patient underwent diuresis and had an ultrasound-guided thoracentesis.  Patient's fluid was felt to be a transudate related to his recent pneumonia.  Patient was noted to have an oxygen requirement and he was discharged home on 2 L of nasal cannula oxygen.  Patient states over the last few days he has had increasing shortness of breath.  He was evaluated at home health nurse today and they recommended he come to the emergency room for further evaluation. Past Medical History:  Diagnosis Date  . Allergy    States his nose runs when he is around grease.   Marland Kitchen Anxiety   . Arthritis   . Atrial fibrillation ([place])   . Cancer ([place])   . Coronary artery disease   . History of total knee replacement   . Hypertension   . Hypothyroidism   . Kidney cancer, primary, with metastasis from kidney to other site [facility])   . Kidney stone   . OSA (obstructive sleep apnea)    pt does not wear cpap at night  . Pituitary cyst ([place])   . Pneumonia    hx of  . Prostate cancer [facility])     Patient Active Problem List   Diagnosis Date  Noted  . CAD (coronary artery disease) 04/09/2018  . HAP (hospital-acquired pneumonia) 04/08/2018  . Labile blood pressure   . Steroid-induced hyperglycemia   . Chronic kidney disease (CKD), stage III (moderate) (HCC)   . Debility 02/26/2018  . HCAP (healthcare-associated pneumonia) 02/03/2018  . Constipation 01/21/2018  . (HFpEF) heart failure with preserved ejection fraction ([place]) 12/16/2017  . Chest pain   . Anticoagulated   . Atrial flutter ([place]) 11/01/2017  . Bilateral foot pain 07/13/2017  . Community acquired pneumonia 06/18/2017  . History of adenomatous polyp of colon 04/12/2017  . Chronic pulmonary embolism ([place]) 10/23/2016  . Cervical disc disease 11/13/2015  . Essential tremor 08/14/2015  . Gout 08/14/2015  . Hypothyroidism 07/12/2014  . Hyperglycemia 07/12/2014  . GAD (generalized anxiety disorder) 11/07/2013  . OSA (obstructive sleep apnea) 07/07/2011  . Asthmatic bronchitis 06/09/2011  . Actinic keratosis 11/27/2009  . Osteoarthritis 11/13/2008  . 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 Morbid obesity ([place]) 10/02/2008  . History of renal cell carcinoma 12/26/2007  . Insomnia 12/26/2007  . TESTOSTERONE DEFICIENCY 12/28/2006  . Essential hypertension 12/28/2006    Past Surgical History:  Procedure Laterality Date  . ABLATION OF DYSRHYTHMIC FOCUS  01/28/2018  . ANTERIOR CERVICAL DECOMP/DISCECTOMY FUSION N/A 08/29/2012   Procedure: ANTERIOR CERVICAL DECOMPRESSION/DISCECTOMY FUSION 1 LEVEL;  Surgeon: Shanon Brow  Adah Salvage, MD;  Location: [place] NEURO ORS;  Service: Neurosurgery;  Laterality: N/A;  . APPENDECTOMY    . ATRIAL FIBRILLATION ABLATION N/A 01/28/2018   Procedure: ATRIAL FIBRILLATION ABLATION;  Surgeon: Constance Haw, MD;  Location: [facility] CV LAB;  Service: Cardiovascular;  Laterality: N/A;  . CARDIAC CATHETERIZATION  2008   clean  . CHOLECYSTECTOMY    . COLONOSCOPY W/ POLYPECTOMY    . CRANIOTOMY N/A 11/08/2012   Procedure: CRANIOTOMY HYPOPHYSECTOMY  TRANSNASAL APPROACH;  Surgeon: Faythe Ghee, MD;  Location: [place] NEURO ORS;  Service: Neurosurgery;  Laterality: N/A;  Transphenoidal Resection of Pituitary Tumor  . FINGER SURGERY Left 2017   Dr Amedeo Plenty  . INSERTION PROSTATE RADIATION SEED    . KIDNEY STONE SURGERY    . KNEE ARTHROSCOPY  2007   left  . NEPHRECTOMY Left   . POSTERIOR CERVICAL LAMINECTOMY Left 04/24/2015   Procedure: Foraminotomy cervical five - cervical six cervical six - cervical seven left;  Surgeon: Eustace Moore, MD;  Location: [facility] NEURO ORS;  Service: Neurosurgery;  Laterality: Left;  . REPLACEMENT TOTAL KNEE BILATERAL          Home Medications    Prior to Admission medications   Medication Sig Start Date End Date Taking? Authorizing Provider  albuterol (PROVENTIL HFA;VENTOLIN HFA) 108 (90 Base) MCG/ACT inhaler Inhale 2 puffs into the lungs every 6 (six) hours as needed for wheezing or shortness of breath. 04/14/18   Marin Olp, MD  allopurinol (ZYLOPRIM) 100 MG tablet Take 1 tablet (100 mg total) by mouth daily. 03/08/18   Marin Olp, MD  apixaban (ELIQUIS) 5 MG TABS tablet Take 1 tablet (5 mg total) by mouth 2 (two) times daily. 03/08/18   Marin Olp, MD  budesonide-formoterol [facility]) 160-4.5 MCG/ACT inhaler Inhale 2 puffs into the lungs 2 (two) times daily. 03/09/18   Marin Olp, MD  colchicine 0.6 MG tablet Take 1 tablet (0.6 mg total) by mouth 2 (two) times daily. 03/04/18   Angiulli, Lavon Paganini, PA-C  cyclobenzaprine (FLEXERIL) 10 MG tablet Take 1 tablet (10 mg total) by mouth 3 (three) times daily as needed for muscle spasms. 03/04/18   Angiulli, Lavon Paganini, PA-C  docusate sodium (COLACE) 100 MG capsule Take 1 capsule (100 mg total) by mouth daily. 02/26/18   Isaac Bliss, Rayford Halsted, MD  escitalopram (LEXAPRO) 20 MG tablet Take 1 tablet (20 mg total) by mouth daily. 03/04/18   Angiulli, Lavon Paganini, PA-C  furosemide (LASIX) 40 MG tablet Take 1 tablet (40 mg total) by mouth daily. 04/11/18  05/21/18  Arrien, Jimmy Picket, MD  guaiFENesin-dextromethorphan (ROBITUSSIN DM) 100-10 MG/5ML syrup Take 5 mLs by mouth every 6 (six) hours as needed for cough. 04/11/18   Arrien, Jimmy Picket, MD  levothyroxine (SYNTHROID, LEVOTHROID) 150 MCG tablet Take 1 tablet (150 mcg total) by mouth daily. 03/04/18   Angiulli, Lavon Paganini, PA-C  Multiple Vitamins-Minerals (MULTIVITAMINS THER. W/MINERALS) TABS Take 1 tablet by mouth daily.    [provider]  nortriptyline (PAMELOR) 75 MG capsule Take 1 capsule (75 mg total) by mouth at bedtime. 03/04/18   Angiulli, Lavon Paganini, PA-C  polyethylene glycol (MIRALAX / GLYCOLAX) packet Take 17 g by mouth daily. 03/04/18   Angiulli, Lavon Paganini, PA-C  propranolol (INDERAL) 10 MG tablet TAKE 1 TABLET (10 MG TOTAL) BY MOUTH 2 (TWO) TIMES DAILY. 03/17/18   Marin Olp, MD  traMADol (ULTRAM) 50 MG tablet Take 1 tablet (50 mg total) by mouth  every 6 (six) hours as needed for moderate pain. 03/08/18   Marin Olp, MD  traZODone (DESYREL) 50 MG tablet TAKE ONE HALF TO ONE TABLET BY MOUTH EVERY NIGHT AT BEDTIME AS NEEDED FOR SLEEP 03/29/18   Marin Olp, MD    Family History Family History  Problem Relation Age of Onset  . Cancer Father        ?mouth, died before patient born  . Cancer Mother        stomach, died when he was young  . Kidney cancer Sister   . Colon cancer Brother 46  . Esophageal cancer Neg Hx   . Rectal cancer Neg Hx   . Stomach cancer Neg Hx     Social History Social History   Tobacco Use  . Smoking status: Former Smoker    Years: 4.00    Types: Cigars  . Smokeless tobacco: Never Used  . Tobacco comment: QUIT 2 YEARS AGO   2017  Substance Use Topics  . Alcohol use: Yes    Alcohol/week: 2.0 standard drinks    Types: 2 Shots of liquor per week    Comment: socially  . Drug use: No     Allergies   Ambien [zolpidem]   Review of Systems Review of Systems  Constitutional: Negative for fever.  Respiratory: Positive  for shortness of breath.   Cardiovascular: Negative for chest pain.  All other systems reviewed and are negative.    Physical Exam Updated Vital Signs BP (!) 96/57 (BP Location: Left Arm)   Pulse 98   Temp 99.1 F (37.3 C) (Oral)   Resp 18   Ht 1.803 m (5\' 11" )   Wt 123.8 kg   SpO2 95%   BMI 38.08 kg/m   Physical Exam  Constitutional: He appears well-developed and well-nourished. No distress.  Overweight  HENT:  Head: Normocephalic and atraumatic.  Right Ear: External ear normal.  Left Ear: External ear normal.  Eyes: Conjunctivae are normal. Right eye exhibits no discharge. Left eye exhibits no discharge. No scleral icterus.  Neck: Neck supple. No tracheal deviation present.  Cardiovascular: Normal rate, regular rhythm and intact distal pulses.  Pulmonary/Chest: Effort normal. No stridor. No respiratory distress. He has decreased breath sounds in the left middle field and the left lower field. He has no wheezes. He has no rales.  Abdominal: Soft. Bowel sounds are normal. He exhibits no distension. There is no tenderness. There is no rebound and no guarding.  Musculoskeletal: He exhibits no tenderness.       Right lower leg: He exhibits edema.       Left lower leg: He exhibits edema.  Neurological: He is alert. He has normal strength. No cranial nerve deficit (no facial droop, extraocular movements intact, no slurred speech) or sensory deficit. He exhibits normal muscle tone. He displays no seizure activity. Coordination normal.  Skin: Skin is warm and dry. No rash noted.  Psychiatric: He has a normal mood and affect.  Nursing note and vitals reviewed.    ED Treatments / Results  Labs (all labs ordered are listed, but only abnormal results are displayed) Labs Reviewed  BASIC METABOLIC PANEL - Abnormal; Notable for the following components:      Result Value   Sodium 130 (*)    Chloride 95 (*)    Creatinine, Ser 1.38 (*)    Calcium 8.5 (*)    GFR calc non Af Amer 48  (*)    GFR calc Af Amer 56 (*)  All other components within normal limits  CBC - Abnormal; Notable for the following components:   WBC 21.4 (*)    Platelets 468 (*)    All other components within normal limits  BRAIN NATRIURETIC PEPTIDE - Abnormal; Notable for the following components:   B Natriuretic Peptide 178.0 (*)    All other components within normal limits  CULTURE, BLOOD (ROUTINE X 2)  CULTURE, BLOOD (ROUTINE X 2)  I-STAT TROPONIN, ED    EKG EKG Interpretation  Date/Time:  Thursday April 14 2018 17:46:22 EDT Ventricular Rate:  99 PR Interval:  216 QRS Duration: 84 QT Interval:  322 QTC Calculation: 413 R Axis:   -33 Text Interpretation:  Sinus rhythm with 1st degree A-V block with Fusion complexes Left axis deviation , first degree av block new since last tracing Low voltage QRS Inferior infarct , age undetermined Cannot rule out Anteroseptal infarct , age undetermined ST & T wave abnormality, consider lateral ischemia Abnormal ECG Confirmed by Dorie Rank [phone]) on 04/14/2018 7:40:37 PM   Radiology Dg Chest 2 View  Result Date: 04/14/2018 CLINICAL DATA:  Shortness of breath EXAM: CHEST - 2 VIEW COMPARISON:  04/10/2018, 04/09/2018, 04/08/2018 FINDINGS: Small right pleural effusion and small to moderate left pleural effusion, may be slightly increased on the left. Persistent consolidation at the lingula and left base. Enlarged cardiomediastinal silhouette with aortic atherosclerosis. Streaky atelectasis at the right base. No pneumothorax. IMPRESSION: 1. Small right pleural effusion and small moderate left pleural effusion, possible slight increase on the left side 2. No change in dense airspace disease at the lingula and left base. 3. Cardiomegaly Electronically Signed   By: Donavan Foil M.D.   On: 04/14/2018 19:47    Procedures Procedures (including critical care time)  Medications Ordered in ED Medications  ipratropium-albuterol (DUONEB) 0.5-2.5 (3) MG/3ML  nebulizer solution 3 mL (has no administration in time range)  sodium chloride 0.9 % bolus 500 mL (has no administration in time range)  ceFEPIme (MAXIPIME) 1 g in sodium chloride 0.9 % 100 mL IVPB (has no administration in time range)     Initial Impression / Assessment and Plan / ED Course  I have reviewed the triage vital signs and the nursing notes.  Pertinent labs & imaging results that were available during my care of the patient were reviewed by me and considered in my medical decision making (see chart for details).   Patient presents to the emergency room for evaluation of worsening shortness of breath.  Patient has a [place] medical history including recurrent pneumonias and congestive heart failure.  Patient presents with worsening shortness of breath despite recent thoracentesis.  Chest x-ray shows increasing left pleural effusion.  He also has a persistent dense airspace disease in the lingula at the left base.  Patient's white blood cell count is increasing.  I am concerned about the possibility of pulmonary infection.  I will consult with the medical service considering his worsening symptoms  Final Clinical Impressions(s) / ED Diagnoses   Final diagnoses:  Pleural effusion  Dyspnea, unspecified type  Leukocytosis, unspecified type      Dorie Rank, MD 04/14/18 2027

## 2018-04-14 NOTE — ED Notes (Signed)
Patient transported to X-ray 

## 2018-04-15 ENCOUNTER — Inpatient Hospital Stay (HOSPITAL_COMMUNITY): Payer: Medicare Other

## 2018-04-15 ENCOUNTER — Encounter (HOSPITAL_COMMUNITY): Payer: Self-pay | Admitting: Student

## 2018-04-15 DIAGNOSIS — E871 Hypo-osmolality and hyponatremia: Secondary | ICD-10-CM

## 2018-04-15 DIAGNOSIS — Z86711 Personal history of pulmonary embolism: Secondary | ICD-10-CM

## 2018-04-15 DIAGNOSIS — D473 Essential (hemorrhagic) thrombocythemia: Secondary | ICD-10-CM

## 2018-04-15 DIAGNOSIS — N183 Chronic kidney disease, stage 3 (moderate): Secondary | ICD-10-CM

## 2018-04-15 DIAGNOSIS — D75839 Thrombocytosis, unspecified: Secondary | ICD-10-CM

## 2018-04-15 DIAGNOSIS — R0600 Dyspnea, unspecified: Secondary | ICD-10-CM

## 2018-04-15 DIAGNOSIS — I440 Atrioventricular block, first degree: Secondary | ICD-10-CM

## 2018-04-15 DIAGNOSIS — R5381 Other malaise: Secondary | ICD-10-CM

## 2018-04-15 DIAGNOSIS — J9601 Acute respiratory failure with hypoxia: Secondary | ICD-10-CM

## 2018-04-15 HISTORY — PX: IR THORACENTESIS ASP PLEURAL SPACE W/IMG GUIDE: IMG5380

## 2018-04-15 LAB — CBC
HEMATOCRIT: 43.2 % (ref 39.0–52.0)
HEMOGLOBIN: 14.2 g/dL (ref 13.0–17.0)
MCH: 29.3 pg (ref 26.0–34.0)
MCHC: 32.9 g/dL (ref 30.0–36.0)
MCV: 89.3 fL (ref 80.0–100.0)
PLATELETS: 430 10*3/uL — AB (ref 150–400)
RBC: 4.84 MIL/uL (ref 4.22–5.81)
RDW: 15.4 % (ref 11.5–15.5)
WBC: 20.3 10*3/uL — ABNORMAL HIGH (ref 4.0–10.5)
nRBC: 0 % (ref 0.0–0.2)

## 2018-04-15 LAB — BODY FLUID CELL COUNT WITH DIFFERENTIAL
EOS FL: 47 %
Lymphs, Fluid: 45 %
Monocyte-Macrophage-Serous Fluid: 4 % — ABNORMAL LOW (ref 50–90)
NEUTROPHIL FLUID: 4 % (ref 0–25)
Total Nucleated Cell Count, Fluid: 780 cu mm (ref 0–1000)

## 2018-04-15 LAB — GRAM STAIN

## 2018-04-15 LAB — TSH: TSH: 0.824 u[IU]/mL (ref 0.350–4.500)

## 2018-04-15 LAB — PROCALCITONIN: Procalcitonin: 0.32 ng/mL

## 2018-04-15 LAB — PROTEIN, PLEURAL OR PERITONEAL FLUID: TOTAL PROTEIN, FLUID: 4.5 g/dL

## 2018-04-15 MED ORDER — ALPRAZOLAM 0.5 MG PO TABS
0.5000 mg | ORAL_TABLET | Freq: Once | ORAL | Status: AC
  Administered 2018-04-15: 0.5 mg via ORAL
  Filled 2018-04-15: qty 1

## 2018-04-15 MED ORDER — ESCITALOPRAM OXALATE 20 MG PO TABS
20.0000 mg | ORAL_TABLET | Freq: Every day | ORAL | Status: DC
  Administered 2018-04-15 – 2018-04-20 (×6): 20 mg via ORAL
  Filled 2018-04-15 (×6): qty 1

## 2018-04-15 MED ORDER — FUROSEMIDE 10 MG/ML IJ SOLN
40.0000 mg | Freq: Once | INTRAMUSCULAR | Status: AC
  Administered 2018-04-15: 40 mg via INTRAVENOUS
  Filled 2018-04-15: qty 4

## 2018-04-15 MED ORDER — TRAZODONE HCL 50 MG PO TABS
25.0000 mg | ORAL_TABLET | Freq: Every evening | ORAL | Status: DC | PRN
  Administered 2018-04-15 – 2018-04-19 (×6): 50 mg via ORAL
  Filled 2018-04-15 (×6): qty 1

## 2018-04-15 MED ORDER — FUROSEMIDE 10 MG/ML IJ SOLN
40.0000 mg | Freq: Two times a day (BID) | INTRAMUSCULAR | Status: DC
  Filled 2018-04-15: qty 4

## 2018-04-15 MED ORDER — TRAMADOL HCL 50 MG PO TABS
50.0000 mg | ORAL_TABLET | Freq: Four times a day (QID) | ORAL | Status: DC | PRN
  Administered 2018-04-16 – 2018-04-19 (×2): 50 mg via ORAL
  Filled 2018-04-15 (×2): qty 1

## 2018-04-15 MED ORDER — POLYETHYLENE GLYCOL 3350 17 G PO PACK
17.0000 g | PACK | Freq: Every day | ORAL | Status: DC
  Administered 2018-04-18 – 2018-04-19 (×2): 17 g via ORAL
  Filled 2018-04-15 (×6): qty 1

## 2018-04-15 MED ORDER — PROPRANOLOL HCL 10 MG PO TABS
10.0000 mg | ORAL_TABLET | Freq: Two times a day (BID) | ORAL | Status: DC
  Administered 2018-04-15 – 2018-04-20 (×12): 10 mg via ORAL
  Filled 2018-04-15 (×13): qty 1

## 2018-04-15 MED ORDER — ALBUTEROL SULFATE (2.5 MG/3ML) 0.083% IN NEBU
3.0000 mL | INHALATION_SOLUTION | Freq: Four times a day (QID) | RESPIRATORY_TRACT | Status: DC | PRN
  Administered 2018-04-15 – 2018-04-18 (×4): 3 mL via RESPIRATORY_TRACT
  Filled 2018-04-15 (×4): qty 3

## 2018-04-15 MED ORDER — NORTRIPTYLINE HCL 25 MG PO CAPS
75.0000 mg | ORAL_CAPSULE | Freq: Every day | ORAL | Status: DC
  Administered 2018-04-15 – 2018-04-19 (×6): 75 mg via ORAL
  Filled 2018-04-15 (×6): qty 3

## 2018-04-15 MED ORDER — CYCLOBENZAPRINE HCL 10 MG PO TABS: 10.0000 mg | ORAL_TABLET | Freq: Three times a day (TID) | ORAL | Status: DC | PRN

## 2018-04-15 MED ORDER — ALLOPURINOL 100 MG PO TABS
100.0000 mg | ORAL_TABLET | Freq: Every day | ORAL | Status: DC
  Administered 2018-04-15 – 2018-04-20 (×6): 100 mg via ORAL
  Filled 2018-04-15 (×6): qty 1

## 2018-04-15 MED ORDER — FUROSEMIDE 10 MG/ML IJ SOLN
40.0000 mg | Freq: Two times a day (BID) | INTRAMUSCULAR | Status: DC
  Administered 2018-04-15 – 2018-04-19 (×8): 40 mg via INTRAVENOUS
  Filled 2018-04-15 (×7): qty 4

## 2018-04-15 MED ORDER — LEVOTHYROXINE SODIUM 75 MCG PO TABS
150.0000 ug | ORAL_TABLET | Freq: Every day | ORAL | Status: DC
  Administered 2018-04-15 – 2018-04-20 (×6): 150 ug via ORAL
  Filled 2018-04-15: qty 2
  Filled 2018-04-15: qty 6
  Filled 2018-04-15 (×4): qty 2

## 2018-04-15 MED ORDER — DOCUSATE SODIUM 100 MG PO CAPS
100.0000 mg | ORAL_CAPSULE | Freq: Every day | ORAL | Status: DC
  Administered 2018-04-15 – 2018-04-20 (×6): 100 mg via ORAL
  Filled 2018-04-15 (×6): qty 1

## 2018-04-15 MED ORDER — ADULT MULTIVITAMIN W/MINERALS CH
1.0000 | ORAL_TABLET | Freq: Every day | ORAL | Status: DC
  Administered 2018-04-15 – 2018-04-20 (×6): 1 via ORAL
  Filled 2018-04-15 (×6): qty 1

## 2018-04-15 MED ORDER — LIDOCAINE HCL 1 % IJ SOLN
INTRAMUSCULAR | Status: AC
  Filled 2018-04-15: qty 20

## 2018-04-15 MED ORDER — DM-GUAIFENESIN ER 30-600 MG PO TB12
1.0000 | ORAL_TABLET | Freq: Two times a day (BID) | ORAL | Status: DC
  Administered 2018-04-15 – 2018-04-20 (×12): 1 via ORAL
  Filled 2018-04-15 (×12): qty 1

## 2018-04-15 MED ORDER — APIXABAN 5 MG PO TABS
5.0000 mg | ORAL_TABLET | Freq: Two times a day (BID) | ORAL | Status: DC
  Administered 2018-04-15 – 2018-04-20 (×10): 5 mg via ORAL
  Filled 2018-04-15 (×10): qty 1

## 2018-04-15 MED ORDER — MOMETASONE FURO-FORMOTEROL FUM 200-5 MCG/ACT IN AERO
2.0000 | INHALATION_SPRAY | Freq: Two times a day (BID) | RESPIRATORY_TRACT | Status: DC
  Administered 2018-04-15 – 2018-04-20 (×11): 2 via RESPIRATORY_TRACT
  Filled 2018-04-15: qty 8.8

## 2018-04-15 NOTE — Procedures (Signed)
PROCEDURE SUMMARY:  Successful image-guided left thoracentesis. Yielded 1.0 liters of clear yellow fluid. Patient tolerated procedure well. No immediate complications.  Specimen was sent for labs. CXR ordered.  Claris Pong Karrine Kluttz PA-C 04/15/2018 1:44 PM

## 2018-04-15 NOTE — Progress Notes (Signed)
PROGRESS NOTE    Wayne Booth  ERD:[id number] DOB: [birth year] DOA: 04/14/2018 PCP: Marin Olp, MD  Brief Narrative: Wayne Booth is a 77 y.o. male with medical history significant of A. fib, CAD, hypertension, hypothyroidism, recurrent pneumonias over the past year and has been admitted to the ICU on the ventilator.  Patient presented for further evaluation of progressively worse dyspnea on exertion.   -Was just discharged from [facility] on 10/14, he was treated for pulmonary edema due to decompensated diastolic heart failure, complicated by bilateral pleural effusions, not felt to have a pneumonia at that time, procalcitonin level was less than 0.1, underwent thoracentesis, fluid was exudative by light's criteria,  fluid culture was negative -Readmitted 10/17 with states he has been having dyspnea on exertion since his recent hospital discharge, +orthopnea.  - IN ED, CT chest  Noted Small to moderate bilateral pleural effusions, increased as compared with CT 02/03/2018. Consolidations within the left greater than right lower lobe, and lingula which may be secondary to atelectasis or pneumonia.   Assessment & Plan:   Principal Problem:   Acute hypoxemic respiratory failure (HCC) -Due to bilateral pleural effusions and underlying left lung pneumonia, cannot rule out component of diastolic CHF as well -Just underwent left thoracentesis on 10/12, fluid analysis was exudative, but cultures were negative, procalcitonin level was less than 0.1 then -Prior to this prolonged hospitalization with MRSA pneumonia, complicated by respiratory failure and intubation, August through September -I have ordered an ultrasound-guided thoracentesis with labs -Procalcitonin is moderately elevated, along with leukocytosis -Continue vancomycin and cefepime today -Follow-up blood cultures and pleural fluid cultures -Pt requests Pulm eval, due to recurrent PNA  Acute on chronic diastolic  CHF -Echo in August with EF of 50% -Continue IV Lasix as tolerated by blood pressure  Mild hyponatremia  -Monitor with diuresis  Chronic kidney disease stage III -Stable, creatinine at baseline of 1.38  Paroxysmal atrial flutter -CHA2DS2VASc 7.  -Continue propranolol for rate control -Resume Eliquis post thoracentesis this evening  History of PE -resume Eliquis post thora  Hypothyroidism - Continue home synthroid.   HTN -Continue propranolol -IV diuresis as above  Gout -Continue home allopurinol  Chronic pain -Continue home Flexeril, tramadol  Depression -Continue home Lexapro, nortriptyline, trazodone  Physical deconditioning -PT eval  ? COPD -seen on chart review, no H/o Smoking or PFTs  DVT prophylaxis: SCDs/Eliquis Code Status: full code. Family Communication: No family present at bedside. Disposition Plan: Anticipate discharge to SNF versus home in 2-3 days  Consultants:      Procedures:   Antimicrobials:  Antibiotics Given (last 72 hours)    Date/Time Action Medication Dose Rate   04/14/18 2149 New Bag/Given   ceFEPIme (MAXIPIME) 2 g in sodium chloride 0.9 % 100 mL IVPB 2 g 200 mL/hr   04/14/18 2235 New Bag/Given   vancomycin (VANCOCIN) 2,500 mg in sodium chloride 0.9 % 500 mL IVPB 2,500 mg 250 mL/hr   04/15/18 1032 New Bag/Given   ceFEPIme (MAXIPIME) 2 g in sodium chloride 0.9 % 100 mL IVPB 2 g 200 mL/hr      Subjective: -mild DoE,some cough  Objective: Vitals:   04/15/18 0400 04/15/18 0600 04/15/18 0740 04/15/18 0752  BP: 94/63  91/66 108/74  Pulse: 97  83 81  Resp: (!) 30  (!) 23   Temp: 98.2 F (36.8 C)     TempSrc: Oral     SpO2: 96%  100% 99%  Weight:  122.9 kg  Height:        Intake/Output Summary (Last 24 hours) at 04/15/2018 1108 Last data filed at 04/15/2018 1032 Gross per 24 hour  Intake 2285 ml  Output 700 ml  Net 1585 ml   Filed Weights   04/14/18 1757 04/14/18 2200 04/15/18 0600  Weight: 123.8 kg  123 kg 122.9 kg    Examination:  General exam: chronically ill appearing male, sitting up in the recliner, no distress Respiratory system: Basilar rhonchi Cardiovascular system: S1 & S2 heard, RRR Gastrointestinal system: Abdomen is nondistended, soft and nontender.Normal bowel sounds heard. Central nervous system: Alert and oriented. No focal neurological deficits. Extremities: 1 plus edema Skin: No rashes, lesions or ulcers Psychiatry: Judgement and insight appear normal. Mood & affect appropriate.     Data Reviewed:   CBC: Recent Labs  Lab 04/08/18 2130 04/09/18 0247 04/10/18 0202 04/14/18 1808 04/15/18 0242  WBC 12.2* 13.2* 15.4* 21.4* 20.3*  NEUTROABS  --   --  12.5*  --   --   HGB 14.0 13.6 13.6 14.7 14.2  HCT 45.2 43.1 41.9 45.1 43.2  MCV 91.7 90.5 89.1 88.8 89.3  PLT 399 368 375 468* 536*   Basic Metabolic Panel: Recent Labs  Lab 04/08/18 2130 04/09/18 1532 04/10/18 0202 04/11/18 0214 04/14/18 1808  NA 133* 134* 133* 136 130*  K 3.7 5.1 3.9 4.1 4.0  CL 98 101 100 101 95*  CO2 25 26 22 27 23   GLUCOSE 114* 154* 153* 91 88  BUN 17 16 17 19 17   CREATININE 1.45* 1.29* 1.35* 1.42* 1.38*  CALCIUM 8.8* 8.9 8.8* 8.5* 8.5*   GFR: Estimated Creatinine Clearance: 61.7 mL/min (A) (by C-G formula based on SCr of 1.38 mg/dL (H)). Liver Function Tests: Recent Labs  Lab 04/08/18 2130  AST 21  ALT 18  ALKPHOS 60  BILITOT 0.5  PROT 6.5  ALBUMIN 3.1*   Recent Labs  Lab 04/08/18 2130  LIPASE 42   No results for input(s): AMMONIA in the last 168 hours. Coagulation Profile: No results for input(s): INR, PROTIME in the last 168 hours. Cardiac Enzymes: No results for input(s): CKTOTAL, CKMB, CKMBINDEX, TROPONINI in the last 168 hours. BNP (last 3 results) No results for input(s): PROBNP in the last 8760 hours. HbA1C: No results for input(s): HGBA1C in the last 72 hours. CBG: No results for input(s): GLUCAP in the last 168 hours. Lipid Profile: No results  for input(s): CHOL, HDL, LDLCALC, TRIG, CHOLHDL, LDLDIRECT in the last 72 hours. Thyroid Function Tests: Recent Labs    04/15/18 0242  TSH 0.824   Anemia Panel: No results for input(s): VITAMINB12, FOLATE, FERRITIN, TIBC, IRON, RETICCTPCT in the last 72 hours. Urine analysis:    Component Value Date/Time   COLORURINE YELLOW 04/08/2018 2126   APPEARANCEUR CLEAR 04/08/2018 2126   LABSPEC 1.015 04/08/2018 2126   PHURINE 5.0 04/08/2018 2126   GLUCOSEU NEGATIVE 04/08/2018 2126   HGBUR NEGATIVE 04/08/2018 2126   HGBUR 2+ 09/22/2007 1444   BILIRUBINUR NEGATIVE 04/08/2018 2126   BILIRUBINUR n 11/07/2015 1417   KETONESUR NEGATIVE 04/08/2018 2126   PROTEINUR NEGATIVE 04/08/2018 2126   UROBILINOGEN 1.0 11/07/2015 1417   UROBILINOGEN 1.0 05/22/2011 1454   NITRITE NEGATIVE 04/08/2018 2126   LEUKOCYTESUR NEGATIVE 04/08/2018 2126   Sepsis Labs: @LABRCNTIP (procalcitonin:4,lacticidven:4)  ) Recent Results (from the past 240 hour(s))  MRSA PCR Screening     Status: None   Collection Time: 04/09/18  3:39 AM  Result Value Ref Range Status   MRSA by  PCR NEGATIVE NEGATIVE Final    Comment:        The GeneXpert MRSA Assay (FDA approved for NASAL specimens only), is one component of a comprehensive MRSA colonization surveillance program. It is not intended to diagnose MRSA infection nor to guide or monitor treatment for MRSA infections. Performed at [facility], [place] [address] [place], [place] [address]   Culture, body fluid-bottle     Status: None   Collection Time: 04/09/18  2:24 PM  Result Value Ref Range Status   Specimen Description PLEURAL  Final   Special Requests NONE  Final   Culture   Final    NO GROWTH 5 DAYS Performed at [facility] [address] [place], [place] [address]    Report Status 04/14/2018 FINAL  Final  Gram stain     Status: None   Collection Time: 04/09/18  2:24 PM  Result Value Ref Range Status   Specimen Description PLEURAL  Final    Special Requests NONE  Final   Gram Stain   Final    MODERATE WBC PRESENT, PREDOMINANTLY PMN NO ORGANISMS SEEN Performed at [facility], [place] [address] [place], [place] [address]    Report Status 04/09/2018 FINAL  Final         Radiology Studies: Dg Chest 2 View  Result Date: 04/14/2018 CLINICAL DATA:  Shortness of breath EXAM: CHEST - 2 VIEW COMPARISON:  04/10/2018, 04/09/2018, 04/08/2018 FINDINGS: Small right pleural effusion and small to moderate left pleural effusion, may be slightly increased on the left. Persistent consolidation at the lingula and left base. Enlarged cardiomediastinal silhouette with aortic atherosclerosis. Streaky atelectasis at the right base. No pneumothorax. IMPRESSION: 1. Small right pleural effusion and small moderate left pleural effusion, possible slight increase on the left side 2. No change in dense airspace disease at the lingula and left base. 3. Cardiomegaly Electronically Signed   By: Donavan Foil M.D.   On: 04/14/2018 19:47   Ct Chest Wo Contrast  Result Date: 04/15/2018 CLINICAL DATA:  Shortness of breath EXAM: CT CHEST WITHOUT CONTRAST TECHNIQUE: Multidetector CT imaging of the chest was performed following the standard protocol without IV contrast. COMPARISON:  Chest x-ray 04/14/2018, CT chest 02/03/2018, CT abdomen pelvis 05/22/2011 FINDINGS: Cardiovascular: Limited evaluation without intravenous contrast. Mild aortic atherosclerosis. No aneurysmal dilatation. Mild coronary vascular calcification. Borderline to mild cardiomegaly. Small pericardial effusion measuring up to 8 mm in maximum thickness anteriorly. Mediastinum/Nodes: Midline trachea. No thyroid mass. Borderline lymph nodes measuring up to 1 cm, mildly decreased in size compared to prior chest CT. Esophagus within normal limits. Lungs/Pleura: Small moderate bilateral pleural effusions, increased compared with prior CT. Consolidation within the lingula and left lower lobe as well as  the right lower lobe. Scattered foci of hazy density in the upper lobes suggesting edema. 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 No acute abnormality Musculoskeletal: Degenerative changes.  No acute osseous abnormality IMPRESSION: 1. Small to moderate bilateral pleural effusions, increased as compared with CT 02/03/2018. Consolidations within the left greater than right lower lobe, and lingula which may be secondary to atelectasis or pneumonia. 2. Background hazy density in the left greater than right upper lobes, possible edema. Small pericardial effusion. Aortic Atherosclerosis (ICD10-I70.0). Electronically Signed   By: Donavan Foil M.D.   On: 04/15/2018 01:54        Scheduled Meds: . allopurinol  100 mg Oral Daily  . dextromethorphan-guaiFENesin  1 tablet Oral  BID  . docusate sodium  100 mg Oral Daily  . escitalopram  20 mg Oral Daily  . furosemide  40 mg Intravenous BID  . levothyroxine  150 mcg Oral QAC breakfast  . mometasone-formoterol  2 puff Inhalation BID  . multivitamin with minerals  1 tablet Oral Daily  . nortriptyline  75 mg Oral QHS  . polyethylene glycol  17 g Oral Daily  . propranolol  10 mg Oral BID   Continuous Infusions: . ceFEPime (MAXIPIME) IV 2 g (04/15/18 1032)  . vancomycin       LOS: 1 day    Time spent: 46min    Domenic Polite, MD [facility] Page via [email or web address], password [email or web address] After 7PM please contact night-coverage  04/15/2018, 11:08 AM

## 2018-04-15 NOTE — Evaluation (Signed)
Physical Therapy Evaluation Patient Details Name: Wayne Booth MRN: [id number] DOB: 02/09/1941 Today's Date: 04/15/2018   History of Present Illness  Wayne Booth is a 77 y.o. male with medical history significant of A. fib, CAD, hypertension, hypothyroidism, recurrent pneumonias over the past year and has been admitted to the ICU on the ventilator.  Patient presented for further evaluation of progressively worse dyspnea on exertion.       Clinical Impression  Pt admitted with above diagnosis. Pt currently with functional limitations due to the deficits listed below (see PT Problem List). PTA, recently admitted earlier this week, recovering back home with HHPT. Today pt walking short distances, DOE 3/4 SpO2 88-92% on RA. Weak and unsteady, advised to use RW for now. Pt agreeable to HHPT once treated and ready for d/c.  Pt will benefit from skilled PT to increase their independence and safety with mobility to allow discharge to the venue listed below.       Follow Up Recommendations Home health PT    Equipment Recommendations  None recommended by PT    Recommendations for Other Services       Precautions / Restrictions Precautions Precautions: Fall Precaution Comments: watch O2      Mobility  Bed Mobility               General bed mobility comments: OOB in chair  Transfers Overall transfer level: Needs assistance Equipment used: None Transfers: Sit to/from Stand Sit to Stand: Supervision            Ambulation/Gait Ambulation/Gait assistance: Supervision Gait Distance (Feet): 80 Feet Assistive device: None Gait Pattern/deviations: Step-through pattern Gait velocity: decr   General Gait Details: moderate unsteadiness but no overt LOB. DOE 2/4  Financial trader Rankin (Stroke Patients Only)       Balance Overall balance assessment: Mild deficits observed, not formally tested                                            Pertinent Vitals/Pain Pain Assessment: No/denies pain    Home Living Family/patient expects to be discharged to:: Private residence Living Arrangements: Spouse/significant other Available Help at Discharge: Family;Available 24 hours/day Type of Home: House Home Access: Level entry     Home Layout: Two level;Able to live on main level with bedroom/bathroom Home Equipment: Kasandra Knudsen - single point      Prior Function Level of Independence: Independent         Comments: was getting HHPT after recent admission     Hand Dominance        Extremity/Trunk Assessment   Upper Extremity Assessment Upper Extremity Assessment: Overall WFL for tasks assessed    Lower Extremity Assessment Lower Extremity Assessment: Overall WFL for tasks assessed       Communication   Communication: No difficulties  Cognition Arousal/Alertness: Awake/alert Behavior During Therapy: WFL for tasks assessed/performed Overall Cognitive Status: Within Functional Limits for tasks assessed                                        General Comments      Exercises     Assessment/Plan    PT Assessment Patient needs continued PT  services  PT Problem List Decreased strength;Decreased activity tolerance;Decreased balance;Decreased mobility;Cardiopulmonary status limiting activity       PT Treatment Interventions DME instruction;Gait training;Stair training;Functional mobility training;Therapeutic activities;Therapeutic exercise;Balance training;Patient/family education    PT Goals (Current goals can be found in the Care Plan section)  Acute Rehab PT Goals Patient Stated Goal: breath better PT Goal Formulation: With patient Time For Goal Achievement: 04/29/18 Potential to Achieve Goals: Good    Frequency Min 3X/week   Barriers to discharge        Co-evaluation               AM-PAC PT "6 Clicks" Daily Activity  Outcome Measure Difficulty  turning over in bed (including adjusting bedclothes, sheets and blankets)?: None Difficulty moving from lying on back to sitting on the side of the bed? : None Difficulty sitting down on and standing up from a chair with arms (e.g., wheelchair, bedside commode, etc,.)?: None Help needed moving to and from a bed to chair (including a wheelchair)?: A Little Help needed walking in hospital room?: A Little Help needed climbing 3-5 steps with a railing? : A Little 6 Click Score: 21    End of Session Equipment Utilized During Treatment: Gait belt Activity Tolerance: Patient tolerated treatment well Patient left: in chair;with call bell/phone within reach Nurse Communication: Mobility status PT Visit Diagnosis: Unsteadiness on feet (R26.81);Other abnormalities of gait and mobility (R26.89);Difficulty in walking, not elsewhere classified (R26.2)    Time: 5834-6219 PT Time Calculation (min) (ACUTE ONLY): 25 min   Charges:   PT Evaluation $PT Eval Low Complexity: 1 Low PT Treatments $Gait Training: 8-22 mins        Reinaldo Berber, PT, DPT Acute Rehabilitation Services Pager: [phone] Office: [phone]    Reinaldo Berber 04/15/2018, 2:04 PM

## 2018-04-15 NOTE — Telephone Encounter (Signed)
Follow Up:    Wayne Booth wanted you to know the ptt was admitted to the hospital last night. He also needs an order for the PT Evaluation that he did yesterday.

## 2018-04-15 NOTE — Consult Note (Signed)
NAME:  Wayne Booth, MRN:  [id number], DOB:  03-14-1941, LOS: 1 ADMISSION DATE:  04/14/2018, CONSULTATION DATE:  10/18 REFERRING MD:  Anastasia Fiedler, CHIEF COMPLAINT:  Recurrent PNA   Brief History   77 year old male with recent admissions for respiratory failure (MRSA PNA, CHF, Effusions) again presenting with dyspnea and admitted for pneumonia with effusions. PCCM consulted.   Past Medical History  MRSA PNA, renal cell carcinoma s/p nephrectomy, HFpEF, OSA not on CPAP.   Significant Hospital Events   10/11 > 14: admit for CHF 10/17: admit for HCAP 10/18: thoracentesis, PCCM consult.   Consults: date of consult/date signed off & final recs:  IR Pulm  Procedures (surgical and bedside):  Thoracentesis 10/18:  Significant Diagnostic Tests:  CT chest 10/17 > Small to moderate bilateral pleural effusions, increased as compared with CT 02/03/2018. Consolidations within the left greater than right lower lobe, and lingula which may be secondary to atelectasis or pneumonia.  Micro Data:  Blood 10/17 > Pleural 10/18 >  Antimicrobials:  Cefepime 10/17 > Vancomycin 10/17 >  Subjective:  Breathing bad with any exertion. Also worse when lying down. Infrequent cough that is minimally productive of clear sputum. No fevers or chills. No edema.   Objective   Blood pressure 135/83, pulse 81, temperature 98.2 F (36.8 C), temperature source Oral, resp. rate (!) 23, height 5\' 11"  (1.803 m), weight 122.9 kg, SpO2 99 %.        Intake/Output Summary (Last 24 hours) at 04/15/2018 1333 Last data filed at 04/15/2018 1300 Gross per 24 hour  Intake 2625 ml  Output 700 ml  Net 1925 ml   Filed Weights   04/14/18 1757 04/14/18 2200 04/15/18 0600  Weight: 123.8 kg 123 kg 122.9 kg    Examination: General: obese male in mild distress on 3L HENT: [place]/At, PERRL, unable to appreciate JVD Lungs: Crackles bilateral bases L > R Cardiovascular: RRR, no MRG Abdomen: Soft, non-tender,  non-distended Extremities: No acute deformity or ROM limitation. Trace edema Neuro: Alert, oriented, non-focal  Resolved Hospital Problem list     Assessment & Plan:   Acute hypoxemic respiratory failure secondary to HCAP: symptoms more in line with volume overload, however it appears as though there is PNA on CT.  - Supplemental O2 for SpO2 goal 90-95% - Incentive spirometry - Follow CXR   Sepsis secondary to HCAP - Empiric antibiotics per primary - Follow cultures - Trend WBC and fever curve - Question if he could be aspirating with recurrent PNA, but had speech eval on 8/30 indicating no issues.  Pleural effusion: bilateral, L > R  - s/p thoracentesis 10/12 and again 10/18 - Pleural fluid studies pending. Hopefully this will advise [place] in the proper direction.  - Follow CXR  Acute on chronic HFpEF: echo from August with LVEF 50-55% - Telemetry monitoring - Agree with diuresis, his symptoms sound more like volume than PNA, but imaging not as clear.  - Strict I&O  OSA (not on CPAP) - Likely needs PSG as an outpatient.    Disposition / Summary of Today's Plan 04/15/18   Monitor in SDU Empiric antibiotics Diuresis Await thora results.    Diet: heart healthy Pain/Anxiety/Delirium protocol (if indicated): n/a VAP protocol (if indicated): n/a DVT prophylaxis: eliquis GI prophylaxis: n/a Hyperglycemia protocol: n/a Mobility: assist Code Status: FULL Family Communication: patient updated.   Labs   CBC: Recent Labs  Lab 04/08/18 2130 04/09/18 0247 04/10/18 0202 04/14/18 1808 04/15/18 0242  WBC 12.2* 13.2*  15.4* 21.4* 20.3*  NEUTROABS  --   --  12.5*  --   --   HGB 14.0 13.6 13.6 14.7 14.2  HCT 45.2 43.1 41.9 45.1 43.2  MCV 91.7 90.5 89.1 88.8 89.3  PLT 399 368 375 468* 430*    Basic Metabolic Panel: Recent Labs  Lab 04/08/18 2130 04/09/18 1532 04/10/18 0202 04/11/18 0214 04/14/18 1808  NA 133* 134* 133* 136 130*  K 3.7 5.1 3.9 4.1 4.0  CL 98 101  100 101 95*  CO2 25 26 22 27 23   GLUCOSE 114* 154* 153* 91 88  BUN 17 16 17 19 17   CREATININE 1.45* 1.29* 1.35* 1.42* 1.38*  CALCIUM 8.8* 8.9 8.8* 8.5* 8.5*   GFR: Estimated Creatinine Clearance: 61.7 mL/min (A) (by C-G formula based on SCr of 1.38 mg/dL (H)). Recent Labs  Lab 04/09/18 0247 04/10/18 0202 04/14/18 1808 04/15/18 0242  PROCALCITON <0.10  --   --  0.32  WBC 13.2* 15.4* 21.4* 20.3*  LATICACIDVEN 1.2  --   --   --     Liver Function Tests: Recent Labs  Lab 04/08/18 2130  AST 21  ALT 18  ALKPHOS 60  BILITOT 0.5  PROT 6.5  ALBUMIN 3.1*   Recent Labs  Lab 04/08/18 2130  LIPASE 42   No results for input(s): AMMONIA in the last 168 hours.  ABG    Component Value Date/Time   PHART 7.441 02/22/2018 0744   PCO2ART 44.1 02/22/2018 0744   PO2ART 81.0 (L) 02/22/2018 0744   HCO3 29.5 (H) 02/22/2018 0744   TCO2 31 02/22/2018 0744   ACIDBASEDEF 4.0 (H) 02/13/2018 0337   O2SAT 95.0 02/22/2018 0744     Coagulation Profile: No results for input(s): INR, PROTIME in the last 168 hours.  Cardiac Enzymes: No results for input(s): CKTOTAL, CKMB, CKMBINDEX, TROPONINI in the last 168 hours.  HbA1C: Hgb A1c MFr Bld  Date/Time Value Ref Range Status  12/16/2017 04:27 PM 6.3 4.6 - 6.5 % Final    Comment:    Glycemic Control Guidelines for People with Diabetes:Non Diabetic:  <6%Goal of Therapy: <7%Additional Action Suggested:  >8%   04/12/2017 08:33 AM 6.1 4.6 - 6.5 % Final    Comment:    Glycemic Control Guidelines for People with Diabetes:Non Diabetic:  <6%Goal of Therapy: <7%Additional Action Suggested:  >8%     CBG: No results for input(s): GLUCAP in the last 168 hours.  Admitting History of Present Illness.   77 year old male with PMH as below, which is significant for Atrial fibrillation on AC, CAD, renal CA s/p nephrectomy, and OSA (not on CPAP). He has unfortunately been admitted several times in the past 6 months for respiratory complications. Most  notably, he had an admission in August of this year for sepsis secondary to MRSA PNA requiring mechanical ventilation and was eventually discharged to CIR. It seems as though he was doing pretty well until 10/11 when he was admitted for respiratory failure secondary to HFpEF. He had pleural effusion at that time as well. He was treated with IV diureses. Thoracentesis was done and pleural fluid was borderline, but was exudative by lights criteria. Felt to be secondary to volume overload. He improved with diuresis and was discharged to home 10/14 on 2L.  He presented again to [facility] ED 10/17 with complaints of dyspnea on exertion with associated orthopnea and minimally productive cough (white sputum). Laboratory evaluation significant for WBC 21.4. CT of the chest concerning for PNA and  bilateral effusions. He was admitted to the hospitalist service and was treated with empiric antibiotics and underwent thoracentesis. Due to the recurrent nature of his illness, PCCM was consulted.   Review of Systems:   Bolds are positive  Constitutional: weight loss, gain, night sweats, Fevers, chills, fatigue .  HEENT: headaches, Sore throat, sneezing, nasal congestion, post nasal drip, Difficulty swallowing, Tooth/dental problems, visual complaints visual changes, ear ache CV:  chest pain, radiates,Orthopnea, PND, swelling in lower extremities, dizziness, palpitations, syncope.  GI  heartburn, indigestion, abdominal pain, nausea, vomiting, diarrhea, change in bowel habits, loss of appetite, bloody stools.  Resp: cough, productive: , hemoptysis, dyspnea, chest pain, pleuritic.  Skin: rash or itching or icterus GU: dysuria, change in color of urine, urgency or frequency. flank pain, hematuria  MS: joint pain or swelling. decreased range of motion  Psych: change in mood or affect. depression or anxiety.  Neuro: difficulty with speech, weakness, numbness, ataxia   Past Medical History  He,  has a past medical  history of Allergy, Anxiety, Arthritis, Atrial fibrillation ([place]), Cancer ([place]), Coronary artery disease, History of total knee replacement, Hypertension, Hypothyroidism, Kidney cancer, primary, with metastasis from kidney to other site [facility]), Kidney stone, OSA (obstructive sleep apnea), Pituitary cyst ([place]), Pneumonia, and Prostate cancer ([place]).   Surgical History    Past Surgical History:  Procedure Laterality Date  . ABLATION OF DYSRHYTHMIC FOCUS  01/28/2018  . ANTERIOR CERVICAL DECOMP/DISCECTOMY FUSION N/A 08/29/2012   Procedure: ANTERIOR CERVICAL DECOMPRESSION/DISCECTOMY FUSION 1 LEVEL;  Surgeon: Eustace Moore, MD;  Location: [place] [facility];  Service: Neurosurgery;  Laterality: N/A;  . APPENDECTOMY    . ATRIAL FIBRILLATION ABLATION N/A 01/28/2018   Procedure: ATRIAL FIBRILLATION ABLATION;  Surgeon: Constance Haw, MD;  Location: [place] [facility];  Service: Cardiovascular;  Laterality: N/A;  . CARDIAC CATHETERIZATION  2008   clean  . CHOLECYSTECTOMY    . COLONOSCOPY W/ POLYPECTOMY    . CRANIOTOMY N/A 11/08/2012   Procedure: CRANIOTOMY HYPOPHYSECTOMY TRANSNASAL APPROACH;  Surgeon: Faythe Ghee, MD;  Location: [place];  Service: Neurosurgery;  Laterality: N/A;  Transphenoidal Resection of Pituitary Tumor  . FINGER SURGERY Left 2017   Dr Amedeo Plenty  . INSERTION PROSTATE RADIATION SEED    . KIDNEY STONE SURGERY    . KNEE ARTHROSCOPY  2007   left  . NEPHRECTOMY Left   . POSTERIOR CERVICAL LAMINECTOMY Left 04/24/2015   Procedure: Foraminotomy cervical five - cervical six cervical six - cervical seven left;  Surgeon: Eustace Moore, MD;  Location: [facility];  Service: Neurosurgery;  Laterality: Left;  . REPLACEMENT TOTAL KNEE BILATERAL       Social History   Social History   Socioeconomic History  . Marital status: Married    Spouse name: [name]  . Number of children: Y  . Years of education: Not on file  . Highest education level: Not on file  Occupational History  .  Occupation: retired Armed forces operational officer: RETIRED  Social Needs  . Financial resource strain: Not on file  . Food insecurity:    Worry: Not on file    Inability: Not on file  . Transportation needs:    Medical: Not on file    Non-medical: Not on file  Tobacco Use  . Smoking status: Former Smoker    Years: 4.00    Types: Cigars  . Smokeless tobacco: Never Used  . Tobacco comment: QUIT 2 YEARS AGO   2017  Substance and Sexual Activity  . Alcohol use: Yes    Alcohol/week: 2.0 standard drinks    Types: 2 Shots of liquor per week    Comment: socially  . Drug use: No  . Sexual activity: Not on file  Lifestyle  . Physical activity:    Days per week: Not on file    Minutes per session: Not on file  . Stress: Not on file  Relationships  . Social connections:    Talks on phone: Not on file    Gets together: Not on file    Attends religious service: Not on file    Active member of club or organization: Not on file    Attends meetings of clubs or organizations: Not on file    Relationship status: Not on file  . Intimate partner violence:    Fear of current or ex partner: Not on file    Emotionally abused: Not on file    Physically abused: Not on file    Forced sexual activity: Not on file  Other Topics Concern  . Not on file  Social History Narrative   Married 1965 (wife patient of Dr. Megan Salon). 2 sons. 4 granddaughters.       Retired Chemical engineer.       Hobbies: golf, fishing, formerly hunting.   ,  reports that he has quit smoking. His smoking use included cigars. He quit after 4.00 years of use. He has never used smokeless tobacco. He reports that he drinks about 2.0 standard drinks of alcohol per week. He reports that he does not use drugs.   Family History   His family history includes Cancer in his father and mother; Colon cancer (age of onset: 51) in his brother; Kidney cancer in his sister. There is no history of Esophageal cancer, Rectal  cancer, or Stomach cancer.   Allergies Allergies  Allergen Reactions  . Ambien [Zolpidem] Anxiety    Anxiety and agitation when tried as sleeper in hospital aug 2019     Home Medications  Prior to Admission medications   Medication Sig Start Date End Date Taking? Authorizing Provider  albuterol (PROVENTIL HFA;VENTOLIN HFA) 108 (90 Base) MCG/ACT inhaler Inhale 2 puffs into the lungs every 6 (six) hours as needed for wheezing or shortness of breath. 04/14/18  Yes Marin Olp, MD  allopurinol (ZYLOPRIM) 100 MG tablet Take 1 tablet (100 mg total) by mouth daily. 03/08/18  Yes Marin Olp, MD  apixaban (ELIQUIS) 5 MG TABS tablet Take 1 tablet (5 mg total) by mouth 2 (two) times daily. 03/08/18   Marin Olp, MD  budesonide-formoterol [facility]) 160-4.5 MCG/ACT inhaler Inhale 2 puffs into the lungs 2 (two) times daily. 03/09/18   Marin Olp, MD  colchicine 0.6 MG tablet Take 1 tablet (0.6 mg total) by mouth 2 (two) times daily. 03/04/18   Angiulli, Lavon Paganini, PA-C  cyclobenzaprine (FLEXERIL) 10 MG tablet Take 1 tablet (10 mg total) by mouth 3 (three) times daily as needed for muscle spasms. 03/04/18   Angiulli, Lavon Paganini, PA-C  docusate sodium (COLACE) 100 MG capsule Take 1 capsule (100 mg total) by mouth daily. 02/26/18   Isaac Bliss, Rayford Halsted, MD  escitalopram (LEXAPRO) 20 MG tablet Take 1 tablet (20 mg total) by mouth daily. 03/04/18   Angiulli, Lavon Paganini, PA-C  furosemide (LASIX) 40 MG tablet Take 1 tablet (40 mg total) by mouth daily. 04/11/18 05/21/18  Arrien, Jimmy Picket, MD  guaiFENesin-dextromethorphan (ROBITUSSIN DM) 100-10 MG/5ML syrup  Take 5 mLs by mouth every 6 (six) hours as needed for cough. 04/11/18   Arrien, Jimmy Picket, MD  levothyroxine (SYNTHROID, LEVOTHROID) 150 MCG tablet Take 1 tablet (150 mcg total) by mouth daily. 03/04/18   Angiulli, Lavon Paganini, PA-C  Multiple Vitamins-Minerals (MULTIVITAMINS THER. W/MINERALS) TABS Take 1 tablet by mouth daily.     [provider]  nortriptyline (PAMELOR) 75 MG capsule Take 1 capsule (75 mg total) by mouth at bedtime. 03/04/18   Angiulli, Lavon Paganini, PA-C  polyethylene glycol (MIRALAX / GLYCOLAX) packet Take 17 g by mouth daily. 03/04/18   Angiulli, Lavon Paganini, PA-C  propranolol (INDERAL) 10 MG tablet TAKE 1 TABLET (10 MG TOTAL) BY MOUTH 2 (TWO) TIMES DAILY. 03/17/18   Marin Olp, MD  traMADol (ULTRAM) 50 MG tablet Take 1 tablet (50 mg total) by mouth every 6 (six) hours as needed for moderate pain. 03/08/18   Marin Olp, MD  traZODone (DESYREL) 50 MG tablet TAKE ONE HALF TO ONE TABLET BY MOUTH EVERY NIGHT AT BEDTIME AS NEEDED FOR SLEEP 03/29/18   Marin Olp, MD     Critical care time:      Georgann Housekeeper, AGACNP-BC [place] Pager [phone] or [phone]  04/15/2018 1:33 PM

## 2018-04-16 LAB — LACTATE DEHYDROGENASE, PLEURAL OR PERITONEAL FLUID: LD FL: 98 U/L — AB (ref 3–23)

## 2018-04-16 LAB — CBC WITH DIFFERENTIAL/PLATELET
Abs Immature Granulocytes: 0.13 10*3/uL — ABNORMAL HIGH (ref 0.00–0.07)
Basophils Absolute: 0.1 10*3/uL (ref 0.0–0.1)
Basophils Relative: 0 %
EOS ABS: 0.7 10*3/uL — AB (ref 0.0–0.5)
Eosinophils Relative: 6 %
HEMATOCRIT: 41.9 % (ref 39.0–52.0)
Hemoglobin: 13.8 g/dL (ref 13.0–17.0)
IMMATURE GRANULOCYTES: 1 %
Lymphocytes Relative: 9 %
Lymphs Abs: 1.2 10*3/uL (ref 0.7–4.0)
MCH: 29.3 pg (ref 26.0–34.0)
MCHC: 32.9 g/dL (ref 30.0–36.0)
MCV: 89 fL (ref 80.0–100.0)
MONO ABS: 1.5 10*3/uL — AB (ref 0.1–1.0)
MONOS PCT: 11 %
NEUTROS PCT: 73 %
Neutro Abs: 9.8 10*3/uL — ABNORMAL HIGH (ref 1.7–7.7)
Platelets: 404 10*3/uL — ABNORMAL HIGH (ref 150–400)
RBC: 4.71 MIL/uL (ref 4.22–5.81)
RDW: 15.3 % (ref 11.5–15.5)
WBC: 13.4 10*3/uL — ABNORMAL HIGH (ref 4.0–10.5)
nRBC: 0 % (ref 0.0–0.2)

## 2018-04-16 LAB — BASIC METABOLIC PANEL
ANION GAP: 9 (ref 5–15)
BUN: 20 mg/dL (ref 8–23)
CALCIUM: 8.4 mg/dL — AB (ref 8.9–10.3)
CO2: 28 mmol/L (ref 22–32)
Chloride: 96 mmol/L — ABNORMAL LOW (ref 98–111)
Creatinine, Ser: 1.22 mg/dL (ref 0.61–1.24)
GFR calc Af Amer: >60 mL/min (ref >60)
GFR calc non Af Amer: 56 mL/min — ABNORMAL LOW (ref >60)
GLUCOSE: 139 mg/dL — AB (ref 70–99)
Potassium: 3.6 mmol/L (ref 3.5–5.1)
Sodium: 133 mmol/L — ABNORMAL LOW (ref 135–145)

## 2018-04-16 NOTE — Progress Notes (Signed)
PROGRESS NOTE    Wayne Booth  CBJ:[id number] DOB: Nov 13, 1940 DOA: 04/14/2018 PCP: Marin Olp, MD  Brief Narrative: Wayne Booth is a 77 y.o. male with medical history significant of A. fib, CAD, hypertension, hypothyroidism, recurrent pneumonias over the past year and has been admitted to the ICU on the ventilator.  Patient presented for further evaluation of progressively worse dyspnea on exertion.   -Was just discharged from [facility] on 10/14, he was treated for pulmonary edema due to decompensated diastolic heart failure, complicated by bilateral pleural effusions, not felt to have a pneumonia at that time, procalcitonin level was less than 0.1, underwent thoracentesis, fluid was exudative by light's criteria,  fluid culture was negative -Readmitted 10/17 with states he has been having dyspnea on exertion since his recent hospital discharge, +orthopnea.  - IN ED, CT chest  Noted Small to moderate bilateral pleural effusions, increased as compared with CT 02/03/2018. Consolidations within the left greater than right lower lobe, and lingula which may be secondary to atelectasis or pneumonia.   Assessment & Plan:  Acute hypoxemic respiratory failure (HCC) -Due to bilateral pleural effusions and underlying left lung pneumonia, +/- mild component of diastolic CHF as well -Just underwent left thoracentesis on 10/12, fluid analysis was exudative, but cultures were negative, procalcitonin level was less than 0.1 then -Prior to this prolonged hospitalization with MRSA pneumonia, complicated by respiratory failure and intubation, August through September -s/p ultrasound-guided thoracentesis, eosinophils 47%  -Procalcitonin is moderately elevated, along with leukocytosis -Continue vancomycin and cefepime today -Follow-up blood cultures and pleural fluid cultures negative -Pt requests Pulm eval, due to recurrent PNA  Acute on chronic diastolic CHF -Echo in August with EF of  50% -Continue IV Lasix as tolerated by blood pressure  Mild hyponatremia  -Monitor with diuresis  Chronic kidney disease stage III -Stable, creatinine at baseline of 1.38  Paroxysmal atrial flutter -CHA2DS2VASc 7.  -Continue propranolol for rate control -Resume Eliquis post thoracentesis this evening  History of PE -resume Eliquis post thora  Hypothyroidism - Continue home synthroid.   HTN -Continue propranolol -IV diuresis as above  Gout -Continue home allopurinol  Chronic pain -Continue home Flexeril, tramadol  Depression -Continue home Lexapro, nortriptyline, trazodone  Physical deconditioning -PT eval  ? COPD -seen on chart review, no H/o Smoking or PFTs  DVT prophylaxis: SCDs/Eliquis Code Status: full code. Family Communication: No family present at bedside. Disposition Plan: Anticipate discharge to SNF versus home in 2-3 days  Consultants:      Procedures:   Antimicrobials:  Antibiotics Given (last 72 hours)    Date/Time Action Medication Dose Rate   04/14/18 2149 New Bag/Given   ceFEPIme (MAXIPIME) 2 g in sodium chloride 0.9 % 100 mL IVPB 2 g 200 mL/hr   04/14/18 2235 New Bag/Given   vancomycin (VANCOCIN) 2,500 mg in sodium chloride 0.9 % 500 mL IVPB 2,500 mg 250 mL/hr   04/15/18 1032 New Bag/Given   ceFEPIme (MAXIPIME) 2 g in sodium chloride 0.9 % 100 mL IVPB 2 g 200 mL/hr   04/15/18 2035 New Bag/Given   ceFEPIme (MAXIPIME) 2 g in sodium chloride 0.9 % 100 mL IVPB 2 g 200 mL/hr   04/15/18 2131 New Bag/Given   vancomycin (VANCOCIN) 1,500 mg in sodium chloride 0.9 % 500 mL IVPB 1,500 mg 200 mL/hr   04/16/18 0907 New Bag/Given   ceFEPIme (MAXIPIME) 2 g in sodium chloride 0.9 % 100 mL IVPB 2 g 200 mL/hr      Subjective: -  breathing improving, not back to baseline yet -some cough and congestion  Objective: Vitals:   04/15/18 2300 04/16/18 0321 04/16/18 0753 04/16/18 0839  BP: 122/81 115/69  107/68  Pulse: 77  85 75  Resp: (!) 21  20 20 16   Temp: 98.2 F (36.8 C) 98.1 F (36.7 C)    TempSrc: Oral Oral    SpO2: 99% 99% 99% 98%  Weight:      Height:        Intake/Output Summary (Last 24 hours) at 04/16/2018 1322 Last data filed at 04/16/2018 0900 Gross per 24 hour  Intake 980 ml  Output 1250 ml  Net -270 ml   Filed Weights   04/14/18 1757 04/14/18 2200 04/15/18 0600  Weight: 123.8 kg 123 kg 122.9 kg    Examination:  Gen: Awake, Alert, Oriented X 3, chronically ill male HEENT: PERRLA, Neck supple, no JVD Lungs: basilar ronchi CVS: S1S2/RRR Abd: soft, Non tender, non distended, BS present Extremities: No edema Skin: no new rashes Psychiatry: Judgement and insight appear normal. Mood & affect appropriate.     Data Reviewed:   CBC: Recent Labs  Lab 04/10/18 0202 04/14/18 1808 04/15/18 0242 04/16/18 0314  WBC 15.4* 21.4* 20.3* 13.4*  NEUTROABS 12.5*  --   --  9.8*  HGB 13.6 14.7 14.2 13.8  HCT 41.9 45.1 43.2 41.9  MCV 89.1 88.8 89.3 89.0  PLT 375 468* 430* 829*   Basic Metabolic Panel: Recent Labs  Lab 04/09/18 1532 04/10/18 0202 04/11/18 0214 04/14/18 1808 04/16/18 0314  NA 134* 133* 136 130* 133*  K 5.1 3.9 4.1 4.0 3.6  CL 101 100 101 95* 96*  CO2 26 22 27 23 28   GLUCOSE 154* 153* 91 88 139*  BUN 16 17 19 17 20   CREATININE 1.29* 1.35* 1.42* 1.38* 1.22  CALCIUM 8.9 8.8* 8.5* 8.5* 8.4*   GFR: Estimated Creatinine Clearance: 69.8 mL/min (by C-G formula based on SCr of 1.22 mg/dL). Liver Function Tests: No results for input(s): AST, ALT, ALKPHOS, BILITOT, PROT, ALBUMIN in the last 168 hours. No results for input(s): LIPASE, AMYLASE in the last 168 hours. No results for input(s): AMMONIA in the last 168 hours. Coagulation Profile: No results for input(s): INR, PROTIME in the last 168 hours. Cardiac Enzymes: No results for input(s): CKTOTAL, CKMB, CKMBINDEX, TROPONINI in the last 168 hours. BNP (last 3 results) No results for input(s): PROBNP in the last 8760  hours. HbA1C: No results for input(s): HGBA1C in the last 72 hours. CBG: No results for input(s): GLUCAP in the last 168 hours. Lipid Profile: No results for input(s): CHOL, HDL, LDLCALC, TRIG, CHOLHDL, LDLDIRECT in the last 72 hours. Thyroid Function Tests: Recent Labs    04/15/18 0242  TSH 0.824   Anemia Panel: No results for input(s): VITAMINB12, FOLATE, FERRITIN, TIBC, IRON, RETICCTPCT in the last 72 hours. Urine analysis:    Component Value Date/Time   COLORURINE YELLOW 04/08/2018 2126   APPEARANCEUR CLEAR 04/08/2018 2126   LABSPEC 1.015 04/08/2018 2126   PHURINE 5.0 04/08/2018 2126   GLUCOSEU NEGATIVE 04/08/2018 2126   HGBUR NEGATIVE 04/08/2018 2126   HGBUR 2+ 09/22/2007 1444   BILIRUBINUR NEGATIVE 04/08/2018 2126   BILIRUBINUR n 11/07/2015 1417   KETONESUR NEGATIVE 04/08/2018 2126   PROTEINUR NEGATIVE 04/08/2018 2126   UROBILINOGEN 1.0 11/07/2015 1417   UROBILINOGEN 1.0 05/22/2011 1454   NITRITE NEGATIVE 04/08/2018 2126   LEUKOCYTESUR NEGATIVE 04/08/2018 2126   Sepsis Labs: @LABRCNTIP (procalcitonin:4,lacticidven:4)  ) Recent Results (from the past 240  hour(s))  MRSA PCR Screening     Status: None   Collection Time: 04/09/18  3:39 AM  Result Value Ref Range Status   MRSA by PCR NEGATIVE NEGATIVE Final    Comment:        The GeneXpert MRSA Assay (FDA approved for NASAL specimens only), is one component of a comprehensive MRSA colonization surveillance program. It is not intended to diagnose MRSA infection nor to guide or monitor treatment for MRSA infections. Performed at [facility], [place] [address], [place], [place] [address]   Culture, body fluid-bottle     Status: None   Collection Time: 04/09/18  2:24 PM  Result Value Ref Range Status   Specimen Description PLEURAL  Final   Special Requests NONE  Final   Culture   Final    NO GROWTH 5 DAYS Performed at [place] [address] [place], [place] [address]    Report Status  04/14/2018 FINAL  Final  Gram stain     Status: None   Collection Time: 04/09/18  2:24 PM  Result Value Ref Range Status   Specimen Description PLEURAL  Final   Special Requests NONE  Final   Gram Stain   Final    MODERATE WBC PRESENT, PREDOMINANTLY PMN NO ORGANISMS SEEN Performed at [facility], [place] [address] [place], [place] [address]    Report Status 04/09/2018 FINAL  Final  Blood culture (routine x 2)     Status: None (Preliminary result)   Collection Time: 04/14/18  8:51 PM  Result Value Ref Range Status   Specimen Description BLOOD RIGHT ANTECUBITAL  Final   Special Requests   Final    BOTTLES DRAWN AEROBIC AND ANAEROBIC Blood Culture results may not be optimal due to an inadequate volume of blood received in culture bottles   Culture   Final    NO GROWTH 2 DAYS Performed at [facility], [place] [address], [place], [place] [address]    Report Status PENDING  Incomplete  Blood culture (routine x 2)     Status: None (Preliminary result)   Collection Time: 04/14/18  8:52 PM  Result Value Ref Range Status   Specimen Description BLOOD LEFT WRIST  Final   Special Requests   Final    BOTTLES DRAWN AEROBIC AND ANAEROBIC Blood Culture results may not be optimal due to an inadequate volume of blood received in culture bottles   Culture   Final    NO GROWTH 2 DAYS Performed at [facility], [place] [address] [place], [place] [address]    Report Status PENDING  Incomplete  Culture, body fluid-bottle     Status: None (Preliminary result)   Collection Time: 04/15/18  2:35 PM  Result Value Ref Range Status   Specimen Description PLEURAL  Final   Special Requests NONE  Final   Culture   Final    NO GROWTH < 24 HOURS Performed at [facility], [place] [address], [place], [place] [address]    Report Status PENDING  Incomplete  Gram stain     Status: None   Collection Time: 04/15/18  2:35 PM  Result Value Ref Range Status   Specimen Description PLEURAL  Final    Special Requests NONE  Final   Gram Stain   Final    FEW FEW WBC PRESENT,BOTH PMN AND MONONUCLEAR NO ORGANISMS SEEN NO ORGANISMS SEEN Performed at [facility], [address],  [place], [place] [address]    Report Status 04/15/2018 FINAL  Final         Radiology Studies: Dg Chest 1 View  Result Date: 04/15/2018 CLINICAL DATA:  Followup left-sided thoracentesis. EXAM: CHEST  1 VIEW COMPARISON:  04/14/2018 FINDINGS: Allowing for a poor inspiration and lordotic AP positioning, there appears to be less pleural fluid on the left, though some does remain. No pneumothorax is seen. Basilar atelectasis persists left more than right. IMPRESSION: No pneumothorax seen on the left following thoracentesis. Somewhat less pleural fluid. See above. Electronically Signed   By: Nelson Chimes M.D.   On: 04/15/2018 14:02   Dg Chest 2 View  Result Date: 04/14/2018 CLINICAL DATA:  Shortness of breath EXAM: CHEST - 2 VIEW COMPARISON:  04/10/2018, 04/09/2018, 04/08/2018 FINDINGS: Small right pleural effusion and small to moderate left pleural effusion, may be slightly increased on the left. Persistent consolidation at the lingula and left base. Enlarged cardiomediastinal silhouette with aortic atherosclerosis. Streaky atelectasis at the right base. No pneumothorax. IMPRESSION: 1. Small right pleural effusion and small moderate left pleural effusion, possible slight increase on the left side 2. No change in dense airspace disease at the lingula and left base. 3. Cardiomegaly Electronically Signed   By: Donavan Foil M.D.   On: 04/14/2018 19:47   Ct Chest Wo Contrast  Result Date: 04/15/2018 CLINICAL DATA:  Shortness of breath EXAM: CT CHEST WITHOUT CONTRAST TECHNIQUE: Multidetector CT imaging of the chest was performed following the standard protocol without IV contrast. COMPARISON:  Chest x-ray 04/14/2018, CT chest 02/03/2018, CT abdomen pelvis 05/22/2011 FINDINGS: Cardiovascular: Limited evaluation without  intravenous contrast. Mild aortic atherosclerosis. No aneurysmal dilatation. Mild coronary vascular calcification. Borderline to mild cardiomegaly. Small pericardial effusion measuring up to 8 mm in maximum thickness anteriorly. Mediastinum/Nodes: Midline trachea. No thyroid mass. Borderline lymph nodes measuring up to 1 cm, mildly decreased in size compared to prior chest CT. Esophagus within normal limits. Lungs/Pleura: Small moderate bilateral pleural effusions, increased compared with prior CT. Consolidation within the lingula and left lower lobe as well as the right lower lobe. Scattered foci of hazy density in the upper lobes suggesting edema. 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 No acute abnormality Musculoskeletal: Degenerative changes.  No acute osseous abnormality IMPRESSION: 1. Small to moderate bilateral pleural effusions, increased as compared with CT 02/03/2018. Consolidations within the left greater than right lower lobe, and lingula which may be secondary to atelectasis or pneumonia. 2. Background hazy density in the left greater than right upper lobes, possible edema. Small pericardial effusion. Aortic Atherosclerosis (ICD10-I70.0). Electronically Signed   By: Donavan Foil M.D.   On: 04/15/2018 01:54   Ir Thoracentesis Asp Pleural Space W/img Guide  Result Date: 04/15/2018 INDICATION: Patient with history of decompensated heart failure, acute hypoxemic respiratory failure, and bilateral pleural effusion L>R. Request is made for diagnostic and therapeutic left thoracentesis. EXAM: ULTRASOUND GUIDED DIAGNOSTIC AND THERAPEUTIC LEFT THORACENTESIS MEDICATIONS: 10 mL of 1% lidocaine COMPLICATIONS: None immediate. PROCEDURE: An ultrasound guided thoracentesis was thoroughly discussed with the patient and questions answered. The benefits, risks, alternatives and complications were also discussed. The patient understands and wishes to proceed with the  procedure. Written consent was obtained. Ultrasound was performed to localize and mark an adequate pocket of fluid in the left chest. The area was then prepped and draped in the normal sterile fashion. 1% Lidocaine was used for local anesthesia. Under ultrasound guidance a 6 Fr Safe-T-Centesis catheter was introduced. Thoracentesis  was performed. The catheter was removed and a dressing applied. FINDINGS: A total of approximately 1.0 L of clear yellow fluid was removed. Samples were sent to the laboratory as requested by the clinical team. IMPRESSION: Successful ultrasound guided left thoracentesis yielding 1.0 L of pleural fluid. Read by: Earley Abide, PA-C Electronically Signed   By: Marybelle Killings M.D.   On: 04/15/2018 14:05        Scheduled Meds: . allopurinol  100 mg Oral Daily  . apixaban  5 mg Oral BID  . dextromethorphan-guaiFENesin  1 tablet Oral BID  . docusate sodium  100 mg Oral Daily  . escitalopram  20 mg Oral Daily  . furosemide  40 mg Intravenous BID  . levothyroxine  150 mcg Oral QAC breakfast  . mometasone-formoterol  2 puff Inhalation BID  . multivitamin with minerals  1 tablet Oral Daily  . nortriptyline  75 mg Oral QHS  . polyethylene glycol  17 g Oral Daily  . propranolol  10 mg Oral BID   Continuous Infusions: . ceFEPime (MAXIPIME) IV 2 g (04/16/18 0907)  . vancomycin Stopped (04/16/18 0700)     LOS: 2 days    Time spent: 24min    Domenic Polite, MD [facility] Page via [email or web address], password [email or web address] After 7PM please contact night-coverage  04/16/2018, 1:22 PM

## 2018-04-16 NOTE — Progress Notes (Signed)
NAME:  Wayne Booth, MRN:  [id number], DOB:  Dec 05, 1940, LOS: 2 ADMISSION DATE:  04/14/2018, CONSULTATION DATE:  10/18 REFERRING MD:  Anastasia Fiedler, CHIEF COMPLAINT:  Recurrent PNA   Brief History   77 year old male with multiple recent admissions for respiratory failure (MRSA PNA, CHF, Effusions) again presenting with dyspnea and admitted for pneumonia with effusions. PCCM consulted.   Last admission was 10/11 -10/14 with working diagnosis of acute respiratory failure, cardiogenic pulmonary edema, bilateral pleural effusion.  Underwent left thoracentesis which showed exudate He is a non-smoker, no asbestos exposure, mold, hot tub, Jacuzzi no other known exposures.  Past Medical History  MRSA PNA, renal cell carcinoma s/p nephrectomy, HFpEF, OSA not on CPAP.    Significant Hospital Events   10/11 > 14: admit for CHF with lt thoracentesis on 10/12 10/17: admit for HCAP 10/18: Repeat lt thoracentesis, PCCM consult.   Consults: date of consult/date signed off & final recs:  IR Pulm  Procedures (surgical and bedside):  Thoracentesis 10/18-WBC 780, lymphs 45%, eos 47%, neutrophils 4%, monocyte macrophage 4%. Micro, cytology- pending  Significant Diagnostic Tests:  CT chest 10/17 > Small to moderate bilateral pleural effusions, increased as compared with CT 02/03/2018. Consolidations within the left greater than right lower lobe, and lingula which may be secondary to atelectasis or pneumonia.  Micro Data:  Blood 10/17 > Pleural 10/18 >  Antimicrobials:  Cefepime 10/17 > Vancomycin 10/17 >  Subjective:  States that breathing is improved after thoracentesis.  No cough, sputum production, wheezing.  Objective   Blood pressure 107/68, pulse 75, temperature 98.1 F (36.7 C), temperature source Oral, resp. rate 16, height 5\' 11"  (1.803 m), weight 122.9 kg, SpO2 98 %.        Intake/Output Summary (Last 24 hours) at 04/16/2018 1648 Last data filed at 04/16/2018 1100 Gross per 24  hour  Intake 980 ml  Output 2250 ml  Net -1270 ml   Filed Weights   04/14/18 1757 04/14/18 2200 04/15/18 0600  Weight: 123.8 kg 123 kg 122.9 kg    Examination: Gen:      No acute distress HEENT:  EOMI, sclera anicteric Neck:     No masses; no thyromegaly Lungs:    Clear to auscultation bilaterally; normal respiratory effort CV:         Regular rate and rhythm; no murmurs Abd:      + bowel sounds; soft, non-tender; no palpable masses, no distension Ext:    No edema; adequate peripheral perfusion Skin:      Warm and dry; no rash Neuro: alert and oriented x 3   Resolved Hospital Problem list     Assessment & Plan:  Acute hypoxemic respiratory failure Reccurent pneumonia - Wean down oxygen as tolerated - Incentive spirometry, follow chest x-ray - Question if he could be aspirating with recurrent PNA, but had speech eval on 8/30 indicating no issues.  Pleural effusion: bilateral, L > R  - s/p thoracentesis 10/12 and again 10/18 Reviewed pleural fluid cell count shows elevated eosinophils of unclear etiology, he also has mild peripheral eosinophilia. Differential diagnosis includes eosinophilic pneumonia, malignancy - We will check serologies for basic connective tissue disease- ANA, RF, CCP, ANCA, IgE.  If cytology and cultures remain negative then consider empiric treatment with prednisone 60 mg with slow taper.  Acute on chronic HFpEF: echo from August with LVEF 50-55% -Continue diuresis per primary  OSA (not on CPAP) - Consider sleep study as outpatient.  Disposition / Summary of  Today's Plan 04/16/18   Monitor in SDU Empiric antibiotics Diuresis Await thora results. Consider prednisone    Diet: heart healthy Pain/Anxiety/Delirium protocol (if indicated): n/a VAP protocol (if indicated): n/a DVT prophylaxis: eliquis GI prophylaxis: n/a Hyperglycemia protocol: n/a Mobility: assist Code Status: FULL Family Communication: patient updated.   Labs    CBC: Recent Labs  Lab 04/10/18 0202 04/14/18 1808 04/15/18 0242 04/16/18 0314  WBC 15.4* 21.4* 20.3* 13.4*  NEUTROABS 12.5*  --   --  9.8*  HGB 13.6 14.7 14.2 13.8  HCT 41.9 45.1 43.2 41.9  MCV 89.1 88.8 89.3 89.0  PLT 375 468* 430* 404*    Basic Metabolic Panel: Recent Labs  Lab 04/10/18 0202 04/11/18 0214 04/14/18 1808 04/16/18 0314  NA 133* 136 130* 133*  K 3.9 4.1 4.0 3.6  CL 100 101 95* 96*  CO2 22 27 23 28   GLUCOSE 153* 91 88 139*  BUN 17 19 17 20   CREATININE 1.35* 1.42* 1.38* 1.22  CALCIUM 8.8* 8.5* 8.5* 8.4*   GFR: Estimated Creatinine Clearance: 69.8 mL/min (by C-G formula based on SCr of 1.22 mg/dL). Recent Labs  Lab 04/10/18 0202 04/14/18 1808 04/15/18 0242 04/16/18 0314  PROCALCITON  --   --  0.32  --   WBC 15.4* 21.4* 20.3* 13.4*    Liver Function Tests: No results for input(s): AST, ALT, ALKPHOS, BILITOT, PROT, ALBUMIN in the last 168 hours. No results for input(s): LIPASE, AMYLASE in the last 168 hours. No results for input(s): AMMONIA in the last 168 hours.  ABG    Component Value Date/Time   PHART 7.441 02/22/2018 0744   PCO2ART 44.1 02/22/2018 0744   PO2ART 81.0 (L) 02/22/2018 0744   HCO3 29.5 (H) 02/22/2018 0744   TCO2 31 02/22/2018 0744   ACIDBASEDEF 4.0 (H) 02/13/2018 0337   O2SAT 95.0 02/22/2018 0744     Coagulation Profile: No results for input(s): INR, PROTIME in the last 168 hours.  Cardiac Enzymes: No results for input(s): CKTOTAL, CKMB, CKMBINDEX, TROPONINI in the last 168 hours.  HbA1C: Hgb A1c MFr Bld  Date/Time Value Ref Range Status  12/16/2017 04:27 PM 6.3 4.6 - 6.5 % Final    Comment:    Glycemic Control Guidelines for People with Diabetes:Non Diabetic:  <6%Goal of Therapy: <7%Additional Action Suggested:  >8%   04/12/2017 08:33 AM 6.1 4.6 - 6.5 % Final    Comment:    Glycemic Control Guidelines for People with Diabetes:Non Diabetic:  <6%Goal of Therapy: <7%Additional Action Suggested:  >8%     CBG: No  results for input(s): GLUCAP in the last 168 hours.  Marshell Garfinkel MD [facility] Pager [phone] If no answer or after 3pm call: [phone] 04/16/2018, 4:48 PM

## 2018-04-17 LAB — CBC
HCT: 39.9 % (ref 39.0–52.0)
Hemoglobin: 12.9 g/dL — ABNORMAL LOW (ref 13.0–17.0)
MCH: 28.8 pg (ref 26.0–34.0)
MCHC: 32.3 g/dL (ref 30.0–36.0)
MCV: 89.1 fL (ref 80.0–100.0)
NRBC: 0 % (ref 0.0–0.2)
Platelets: 462 10*3/uL — ABNORMAL HIGH (ref 150–400)
RBC: 4.48 MIL/uL (ref 4.22–5.81)
RDW: 15.2 % (ref 11.5–15.5)
WBC: 10.6 10*3/uL — AB (ref 4.0–10.5)

## 2018-04-17 LAB — BASIC METABOLIC PANEL
Anion gap: 7 (ref 5–15)
BUN: 15 mg/dL (ref 8–23)
CHLORIDE: 99 mmol/L (ref 98–111)
CO2: 30 mmol/L (ref 22–32)
CREATININE: 1.1 mg/dL (ref 0.61–1.24)
Calcium: 8.4 mg/dL — ABNORMAL LOW (ref 8.9–10.3)
Glucose, Bld: 86 mg/dL (ref 70–99)
POTASSIUM: 3.6 mmol/L (ref 3.5–5.1)
SODIUM: 136 mmol/L (ref 135–145)

## 2018-04-17 LAB — PROTEIN, TOTAL: TOTAL PROTEIN: 6.1 g/dL — AB (ref 6.5–8.1)

## 2018-04-17 LAB — LACTATE DEHYDROGENASE: LDH: 129 U/L (ref 98–192)

## 2018-04-17 NOTE — Progress Notes (Signed)
PROGRESS NOTE    Wayne Booth  TDD:[id number] DOB: 05-20-1941 DOA: 04/14/2018 PCP: Marin Olp, MD  Brief Narrative: Wayne Booth is a 77 y.o. male with medical history significant of A. fib, CAD, hypertension, hypothyroidism, recurrent pneumonias over the past year and has been admitted to the ICU on the ventilator.  Patient presented for further evaluation of progressively worse dyspnea on exertion.   -Was just discharged from [facility] on 10/14, he was treated for pulmonary edema due to decompensated diastolic heart failure, complicated by bilateral pleural effusions, not felt to have a pneumonia at that time, procalcitonin level was less than 0.1, underwent thoracentesis, fluid was exudative by light's criteria,  fluid culture was negative -Readmitted 10/17 with states he has been having dyspnea on exertion since his recent hospital discharge, +orthopnea.  - IN ED, CT chest  Noted Small to moderate bilateral pleural effusions, increased as compared with CT 02/03/2018. Consolidations within the left greater than right lower lobe, and lingula which may be secondary to atelectasis or pneumonia.   Assessment & Plan:  Acute hypoxemic respiratory failure (HCC) -Due to bilateral pleural effusions and underlying left lung pneumonia, +/- mild component of diastolic CHF as well -recent admission-underwent left thoracentesis on 10/12, fluid analysis was exudative, but cultures were negative, procalcitonin level was less than 0.1 then -Prior to this prolonged hospitalization with MRSA pneumonia, complicated by respiratory failure and intubation, August through September -s/p ultrasound-guided thoracentesis-10/18 with 1L drained, fluid is exudative by Lights criteria and has 47% eosinophils  -remains on Vancomycin and Cefepime, will likley DC Vancomycin tomorrow -blood cultures and pleural fluid cultures are negative -appreciate Pulm input, FU Cytology, may have Eosinophilic  PNA-pulm to consider Steroids  Acute on chronic diastolic CHF -Echo in August with EF of 50% -Continue IV Lasix today -I/O inaccurate  Mild hyponatremia  -improved with diuresis  Chronic kidney disease stage III -Stable, creatinine at baseline of 1.38  Paroxysmal atrial flutter -CHA2DS2VASc 7.  -Continue propranolol for rate control -Resumed Eliquis  History of PE -continue Eliquis    Hypothyroidism - Continue home synthroid.   HTN -Continue propranolol -IV diuresis as above  Gout -Continue home allopurinol  Chronic pain -Continue home Flexeril, tramadol  Depression -Continue home Lexapro, nortriptyline, trazodone  Physical deconditioning -PT eval  ? COPD -seen on chart review, no H/o Smoking or PFTs  DVT prophylaxis: SCDs/Eliquis Code Status: full code. Family Communication: No family present at bedside. Disposition Plan: Anticipate discharge  home in 2days if stable  Consultants:   Pulm   Procedures: THOracentesis 10/18: 1L drained  Antimicrobials:  Antibiotics Given (last 72 hours)    Date/Time Action Medication Dose Rate   04/14/18 2149 New Bag/Given   ceFEPIme (MAXIPIME) 2 g in sodium chloride 0.9 % 100 mL IVPB 2 g 200 mL/hr   04/14/18 2235 New Bag/Given   vancomycin (VANCOCIN) 2,500 mg in sodium chloride 0.9 % 500 mL IVPB 2,500 mg 250 mL/hr   04/15/18 1032 New Bag/Given   ceFEPIme (MAXIPIME) 2 g in sodium chloride 0.9 % 100 mL IVPB 2 g 200 mL/hr   04/15/18 2035 New Bag/Given   ceFEPIme (MAXIPIME) 2 g in sodium chloride 0.9 % 100 mL IVPB 2 g 200 mL/hr   04/15/18 2131 New Bag/Given   vancomycin (VANCOCIN) 1,500 mg in sodium chloride 0.9 % 500 mL IVPB 1,500 mg 200 mL/hr   04/16/18 0907 New Bag/Given   ceFEPIme (MAXIPIME) 2 g in sodium chloride 0.9 % 100 mL IVPB 2  g 200 mL/hr   04/16/18 2228 New Bag/Given   ceFEPIme (MAXIPIME) 2 g in sodium chloride 0.9 % 100 mL IVPB 2 g 200 mL/hr   04/16/18 2359 New Bag/Given   vancomycin (VANCOCIN)  1,500 mg in sodium chloride 0.9 % 500 mL IVPB 1,500 mg 250 mL/hr   04/17/18 7342 New Bag/Given   ceFEPIme (MAXIPIME) 2 g in sodium chloride 0.9 % 100 mL IVPB 2 g 200 mL/hr      Subjective: -breathing continues to improve, not back to baseline yet  Objective: Vitals:   04/17/18 0300 04/17/18 0731 04/17/18 0734 04/17/18 1129  BP: 132/84  113/67 (!) 141/90  Pulse: 88 88 80 79  Resp: 18 20 (!) 22 19  Temp: (!) 97.5 F (36.4 C)  97.6 F (36.4 C) (!) 97.5 F (36.4 C)  TempSrc: Oral  Oral Oral  SpO2: 100% 99% 100% 100%  Weight:      Height:        Intake/Output Summary (Last 24 hours) at 04/17/2018 1345 Last data filed at 04/17/2018 1100 Gross per 24 hour  Intake 340 ml  Output 1300 ml  Net -960 ml   Filed Weights   04/14/18 1757 04/14/18 2200 04/15/18 0600  Weight: 123.8 kg 123 kg 122.9 kg    Examination:  Gen: Awake, Alert, Oriented X 3, elderly chronically ill male HEENT: PERRLA, Neck supple, no JVD Lungs: Basilar rhonchi CVS: S1S2/RRR Abd: soft, Non tender, non distended, BS present Extremities: No edema Skin: no new rashes Psychiatry: Judgement and insight appear normal. Mood & affect appropriate.     Data Reviewed:   CBC: Recent Labs  Lab 04/14/18 1808 04/15/18 0242 04/16/18 0314 04/17/18 0730  WBC 21.4* 20.3* 13.4* 10.6*  NEUTROABS  --   --  9.8*  --   HGB 14.7 14.2 13.8 12.9*  HCT 45.1 43.2 41.9 39.9  MCV 88.8 89.3 89.0 89.1  PLT 468* 430* 404* 876*   Basic Metabolic Panel: Recent Labs  Lab 04/11/18 0214 04/14/18 1808 04/16/18 0314 04/17/18 0730  NA 136 130* 133* 136  K 4.1 4.0 3.6 3.6  CL 101 95* 96* 99  CO2 27 23 28 30   GLUCOSE 91 88 139* 86  BUN 19 17 20 15   CREATININE 1.42* 1.38* 1.22 1.10  CALCIUM 8.5* 8.5* 8.4* 8.4*   GFR: Estimated Creatinine Clearance: 77.4 mL/min (by C-G formula based on SCr of 1.1 mg/dL). Liver Function Tests: Recent Labs  Lab 04/17/18 0730  PROT 6.1*   No results for input(s): LIPASE, AMYLASE in the  last 168 hours. No results for input(s): AMMONIA in the last 168 hours. Coagulation Profile: No results for input(s): INR, PROTIME in the last 168 hours. Cardiac Enzymes: No results for input(s): CKTOTAL, CKMB, CKMBINDEX, TROPONINI in the last 168 hours. BNP (last 3 results) No results for input(s): PROBNP in the last 8760 hours. HbA1C: No results for input(s): HGBA1C in the last 72 hours. CBG: No results for input(s): GLUCAP in the last 168 hours. Lipid Profile: No results for input(s): CHOL, HDL, LDLCALC, TRIG, CHOLHDL, LDLDIRECT in the last 72 hours. Thyroid Function Tests: Recent Labs    04/15/18 0242  TSH 0.824   Anemia Panel: No results for input(s): VITAMINB12, FOLATE, FERRITIN, TIBC, IRON, RETICCTPCT in the last 72 hours. Urine analysis:    Component Value Date/Time   COLORURINE YELLOW 04/08/2018 2126   APPEARANCEUR CLEAR 04/08/2018 2126   LABSPEC 1.015 04/08/2018 2126   PHURINE 5.0 04/08/2018 2126   GLUCOSEU NEGATIVE 04/08/2018  2126   HGBUR NEGATIVE 04/08/2018 2126   HGBUR 2+ 09/22/2007 1444   BILIRUBINUR NEGATIVE 04/08/2018 2126   BILIRUBINUR n 11/07/2015 1417   KETONESUR NEGATIVE 04/08/2018 2126   PROTEINUR NEGATIVE 04/08/2018 2126   UROBILINOGEN 1.0 11/07/2015 1417   UROBILINOGEN 1.0 05/22/2011 1454   NITRITE NEGATIVE 04/08/2018 2126   LEUKOCYTESUR NEGATIVE 04/08/2018 2126   Sepsis Labs: @LABRCNTIP (procalcitonin:4,lacticidven:4)  ) Recent Results (from the past 240 hour(s))  MRSA PCR Screening     Status: None   Collection Time: 04/09/18  3:39 AM  Result Value Ref Range Status   MRSA by PCR NEGATIVE NEGATIVE Final    Comment:        The GeneXpert MRSA Assay (FDA approved for NASAL specimens only), is one component of a comprehensive MRSA colonization surveillance program. It is not intended to diagnose MRSA infection nor to guide or monitor treatment for MRSA infections. Performed at [facility], [place] [address], [place], [place]  [address]   Culture, body fluid-bottle     Status: None   Collection Time: 04/09/18  2:24 PM  Result Value Ref Range Status   Specimen Description PLEURAL  Final   Special Requests NONE  Final   Culture   Final    NO GROWTH 5 DAYS Performed at [place] [address], [place], [place] [address]    Report Status 04/14/2018 FINAL  Final  Gram stain     Status: None   Collection Time: 04/09/18  2:24 PM  Result Value Ref Range Status   Specimen Description PLEURAL  Final   Special Requests NONE  Final   Gram Stain   Final    MODERATE WBC PRESENT, PREDOMINANTLY PMN NO ORGANISMS SEEN Performed at [facility], [place] [address] [place], [place] [address]    Report Status 04/09/2018 FINAL  Final  Blood culture (routine x 2)     Status: None (Preliminary result)   Collection Time: 04/14/18  8:51 PM  Result Value Ref Range Status   Specimen Description BLOOD RIGHT ANTECUBITAL  Final   Special Requests   Final    BOTTLES DRAWN AEROBIC AND ANAEROBIC Blood Culture results may not be optimal due to an inadequate volume of blood received in culture bottles   Culture   Final    NO GROWTH 3 DAYS Performed at [facility], [place] [address], [place], [place] [address]    Report Status PENDING  Incomplete  Blood culture (routine x 2)     Status: None (Preliminary result)   Collection Time: 04/14/18  8:52 PM  Result Value Ref Range Status   Specimen Description BLOOD LEFT WRIST  Final   Special Requests   Final    BOTTLES DRAWN AEROBIC AND ANAEROBIC Blood Culture results may not be optimal due to an inadequate volume of blood received in culture bottles   Culture   Final    NO GROWTH 3 DAYS Performed at [facility], [place] [address], [place], [place] [address]    Report Status PENDING  Incomplete  Culture, body fluid-bottle     Status: None (Preliminary result)   Collection Time: 04/15/18  2:35 PM  Result Value Ref Range Status   Specimen Description PLEURAL  Final    Special Requests NONE  Final   Culture   Final    NO GROWTH 2 DAYS Performed at [facility], [address] [place], [place] [address]    Report Status  PENDING  Incomplete  Gram stain     Status: None   Collection Time: 04/15/18  2:35 PM  Result Value Ref Range Status   Specimen Description PLEURAL  Final   Special Requests NONE  Final   Gram Stain   Final    FEW FEW WBC PRESENT,BOTH PMN AND MONONUCLEAR NO ORGANISMS SEEN NO ORGANISMS SEEN Performed at [facility], [address] [place], [place] [address]    Report Status 04/15/2018 FINAL  Final         Radiology Studies: Dg Chest 1 View  Result Date: 04/15/2018 CLINICAL DATA:  Followup left-sided thoracentesis. EXAM: CHEST  1 VIEW COMPARISON:  04/14/2018 FINDINGS: Allowing for a poor inspiration and lordotic AP positioning, there appears to be less pleural fluid on the left, though some does remain. No pneumothorax is seen. Basilar atelectasis persists left more than right. IMPRESSION: No pneumothorax seen on the left following thoracentesis. Somewhat less pleural fluid. See above. Electronically Signed   By: Nelson Chimes M.D.   On: 04/15/2018 14:02        Scheduled Meds: . allopurinol  100 mg Oral Daily  . apixaban  5 mg Oral BID  . dextromethorphan-guaiFENesin  1 tablet Oral BID  . docusate sodium  100 mg Oral Daily  . escitalopram  20 mg Oral Daily  . furosemide  40 mg Intravenous BID  . levothyroxine  150 mcg Oral QAC breakfast  . mometasone-formoterol  2 puff Inhalation BID  . multivitamin with minerals  1 tablet Oral Daily  . nortriptyline  75 mg Oral QHS  . polyethylene glycol  17 g Oral Daily  . propranolol  10 mg Oral BID   Continuous Infusions: . ceFEPime (MAXIPIME) IV 2 g (04/17/18 0952)  . vancomycin 1,500 mg (04/16/18 2359)     LOS: 3 days    Time spent: 74min    Domenic Polite, MD [facility] Page via [email or web address], password [email or web address] After 7PM please contact  night-coverage  04/17/2018, 1:45 PM

## 2018-04-18 ENCOUNTER — Telehealth: Payer: Self-pay | Admitting: Family Medicine

## 2018-04-18 DIAGNOSIS — J90 Pleural effusion, not elsewhere classified: Secondary | ICD-10-CM

## 2018-04-18 DIAGNOSIS — R0902 Hypoxemia: Secondary | ICD-10-CM

## 2018-04-18 DIAGNOSIS — J189 Pneumonia, unspecified organism: Secondary | ICD-10-CM

## 2018-04-18 LAB — CBC
HEMATOCRIT: 41.1 % (ref 39.0–52.0)
HEMOGLOBIN: 13.1 g/dL (ref 13.0–17.0)
MCH: 28.3 pg (ref 26.0–34.0)
MCHC: 31.9 g/dL (ref 30.0–36.0)
MCV: 88.8 fL (ref 80.0–100.0)
NRBC: 0 % (ref 0.0–0.2)
Platelets: 544 10*3/uL — ABNORMAL HIGH (ref 150–400)
RBC: 4.63 MIL/uL (ref 4.22–5.81)
RDW: 15.4 % (ref 11.5–15.5)
WBC: 11.9 10*3/uL — AB (ref 4.0–10.5)

## 2018-04-18 LAB — BASIC METABOLIC PANEL
ANION GAP: 10 (ref 5–15)
BUN: 14 mg/dL (ref 8–23)
CHLORIDE: 97 mmol/L — AB (ref 98–111)
CO2: 28 mmol/L (ref 22–32)
Calcium: 8.5 mg/dL — ABNORMAL LOW (ref 8.9–10.3)
Creatinine, Ser: 1.15 mg/dL (ref 0.61–1.24)
GFR calc non Af Amer: >60 mL/min (ref >60)
Glucose, Bld: 88 mg/dL (ref 70–99)
Potassium: 3.6 mmol/L (ref 3.5–5.1)
Sodium: 135 mmol/L (ref 135–145)

## 2018-04-18 LAB — CYCLIC CITRUL PEPTIDE ANTIBODY, IGG/IGA: CCP Antibodies IgG/IgA: 9 units (ref 0–19)

## 2018-04-18 LAB — ANTINUCLEAR ANTIBODIES, IFA: ANA Ab, IFA: NEGATIVE

## 2018-04-18 LAB — PH, BODY FLUID: pH, Body Fluid: 7.6

## 2018-04-18 MED ORDER — POTASSIUM CHLORIDE CRYS ER 20 MEQ PO TBCR
40.0000 meq | EXTENDED_RELEASE_TABLET | Freq: Two times a day (BID) | ORAL | Status: DC
  Administered 2018-04-18 – 2018-04-20 (×4): 40 meq via ORAL
  Filled 2018-04-18 (×4): qty 2

## 2018-04-18 NOTE — Care Management Note (Signed)
Case Management Note  Patient Details  Name: Wayne Booth MRN: [id number] Date of Birth: [birth year]  Subjective/Objective:   From home with spouse, has home oxygen with [facility], presents with acute hypoxic  resp failure, aflutter, ckd, thrombocytosis, hx of pe (eliquis pta) pl effusion, chf, awaiting blood work, on iv abx., plan for [employer], he is active with [facility] for RN, PT, OT, will resume at discharge.                  Action/Plan: DC home when medically ready.  Expected Discharge Date:                  Expected Discharge Plan:  [place]  In-House Referral:     Discharge planning Services  CM Consult  Post Acute Care Choice:  Resumption of Svcs/PTA Provider Choice offered to:  Patient  DME Arranged:    DME Agency:     HH Arranged:  RN, Disease Management, PT, OT [place] Agency:  [place]  Status of Service:  Completed, signed off  If discussed at [place] of Stay Meetings, dates discussed:    Additional Comments:  Zenon Mayo, RN 04/18/2018, 11:55 AM

## 2018-04-18 NOTE — Telephone Encounter (Signed)
Copied from [place] [phone]. Topic: General - Other >> Apr 18, 2018 11:10 AM Janace Aris A wrote: Reason for CRM: Herbert Deaner from [facility], called in requesting verbal orders. 1 week 1 for evaluation.   the pt was sent to hospital, the last he has heard pt is still in hospital.   [phone] CB#

## 2018-04-18 NOTE — Care Management Important Message (Signed)
Important Message  Patient Details  Name: Wayne Booth MRN: [id number] Date of Birth: 31-Dec-1940   Medicare Important Message Given:  Yes    [name] 04/18/2018, 11:20 AM

## 2018-04-18 NOTE — Progress Notes (Signed)
PROGRESS NOTE    Wayne Booth  YFV:[id number] DOB: Dec 07, 1940 DOA: 04/14/2018 PCP: Marin Olp, MD  Brief Narrative: Wayne Booth is a 77 y.o. male with medical history significant of A. fib, CAD, hypertension, hypothyroidism, recurrent pneumonias over the past year and has been admitted to the ICU on the ventilator.  Patient presented for further evaluation of progressively worse dyspnea on exertion.   -Was just discharged from [facility] on 10/14, he was treated for pulmonary edema due to decompensated diastolic heart failure, complicated by bilateral pleural effusions, not felt to have a pneumonia at that time, procalcitonin level was less than 0.1, underwent thoracentesis, fluid was exudative by light's criteria,  fluid culture was negative -Readmitted 10/17 with states he has been having dyspnea on exertion since his recent hospital discharge, +orthopnea.  - IN ED, CT chest  Noted Small to moderate bilateral pleural effusions, increased as compared with CT 02/03/2018. Consolidations within the left greater than right lower lobe, and lingula which may be secondary to atelectasis or pneumonia.   Assessment & Plan:  Acute hypoxemic respiratory failure (HCC) -Due to bilateral pleural effusions, diastolic CHF and underlying left lung pneumonia -recent admission-underwent left thoracentesis on 10/12, fluid analysis was exudative, but cultures were negative, procalcitonin level was less than 0.1 then -Prior to recent admission, had prolonged hospitalization with MRSA pneumonia, complicated by respiratory failure and intubation, August through September -now started broad spectrum Abx, IV Lasix, s/p ultrasound-guided thoracentesis on 10/18 with 1L drained, fluid is exudative by Lights criteria and has 47% eosinophils, Cytology Pending -Day 4 of IV Vancomycin and Cefepime, will likley DC Vancomycin today -blood cultures and pleural fluid cultures are negative -appreciate Pulm  input, FU Cytology, may have Eosinophilic PNA-pulm to consider Steroids -clinically much better today, off O2 now  Acute on chronic diastolic CHF -Echo in August with EF of 50% -Continue IV Lasix today -I/O inaccurate -transition to PO Lasix tomorrow  Mild hyponatremia  -improved with diuresis  Chronic kidney disease stage III -Stable, creatinine at baseline of 1.38  Paroxysmal atrial flutter -CHA2DS2VASc 7.  -Continue propranolol for rate control -Resumed Eliquis  History of PE -continue Eliquis    Hypothyroidism - Continue home synthroid.   HTN -Continue propranolol -IV diuresis as above  Gout -Continue home allopurinol  Chronic pain -Continue home Flexeril, tramadol  Depression -Continue home Lexapro, nortriptyline, trazodone  Physical deconditioning -PT eval  ? COPD -seen on chart review, no H/o Smoking or PFTs  DVT prophylaxis: SCDs/Eliquis Code Status: full code. Family Communication: No family present at bedside. Disposition Plan: Anticipate discharge  home tomorrow if stable  Consultants:   Pulm   Procedures: THOracentesis 10/18: 1L drained  Antimicrobials:  Antibiotics Given (last 72 hours)    Date/Time Action Medication Dose Rate   04/15/18 2035 New Bag/Given   ceFEPIme (MAXIPIME) 2 g in sodium chloride 0.9 % 100 mL IVPB 2 g 200 mL/hr   04/15/18 2131 New Bag/Given   vancomycin (VANCOCIN) 1,500 mg in sodium chloride 0.9 % 500 mL IVPB 1,500 mg 200 mL/hr   04/16/18 0907 New Bag/Given   ceFEPIme (MAXIPIME) 2 g in sodium chloride 0.9 % 100 mL IVPB 2 g 200 mL/hr   04/16/18 2228 New Bag/Given   ceFEPIme (MAXIPIME) 2 g in sodium chloride 0.9 % 100 mL IVPB 2 g 200 mL/hr   04/16/18 2359 New Bag/Given   vancomycin (VANCOCIN) 1,500 mg in sodium chloride 0.9 % 500 mL IVPB 1,500 mg 250 mL/hr  04/17/18 0952 New Bag/Given   ceFEPIme (MAXIPIME) 2 g in sodium chloride 0.9 % 100 mL IVPB 2 g 200 mL/hr   04/17/18 2230 New Bag/Given   ceFEPIme  (MAXIPIME) 2 g in sodium chloride 0.9 % 100 mL IVPB 2 g 200 mL/hr   04/17/18 2310 New Bag/Given   vancomycin (VANCOCIN) 1,500 mg in sodium chloride 0.9 % 500 mL IVPB 1,500 mg 250 mL/hr   04/18/18 0902 New Bag/Given   ceFEPIme (MAXIPIME) 2 g in sodium chloride 0.9 % 100 mL IVPB 2 g 200 mL/hr      Subjective: -breathing improving, off O2, wants to go home  Objective: Vitals:   04/17/18 2327 04/18/18 0528 04/18/18 0745 04/18/18 0800  BP: 116/71 129/80  122/87  Pulse: 95 80  83  Resp:  18  17  Temp:  97.7 F (36.5 C)  (!) 97.5 F (36.4 C)  TempSrc:  Oral  Oral  SpO2:  95% 97% 98%  Weight:  121.8 kg    Height:        Intake/Output Summary (Last 24 hours) at 04/18/2018 1150 Last data filed at 04/18/2018 0600 Gross per 24 hour  Intake -  Output 2075 ml  Net -2075 ml   Filed Weights   04/14/18 2200 04/15/18 0600 04/18/18 0528  Weight: 123 kg 122.9 kg 121.8 kg    Examination:  Gen: Awake, Alert, Oriented X 3, do distress HEENT: PERRLA, Neck supple, no JVD Lungs: decreased BS at bases CVS: S1S2/RRR Abd: soft, Non tender, non distended, BS present Extremities: No edema Skin: no new rashes Psychiatry: Judgement and insight appear normal. Mood & affect appropriate.     Data Reviewed:   CBC: Recent Labs  Lab 04/14/18 1808 04/15/18 0242 04/16/18 0314 04/17/18 0730 04/18/18 0257  WBC 21.4* 20.3* 13.4* 10.6* 11.9*  NEUTROABS  --   --  9.8*  --   --   HGB 14.7 14.2 13.8 12.9* 13.1  HCT 45.1 43.2 41.9 39.9 41.1  MCV 88.8 89.3 89.0 89.1 88.8  PLT 468* 430* 404* 462* 774*   Basic Metabolic Panel: Recent Labs  Lab 04/14/18 1808 04/16/18 0314 04/17/18 0730 04/18/18 0257  NA 130* 133* 136 135  K 4.0 3.6 3.6 3.6  CL 95* 96* 99 97*  CO2 23 28 30 28   GLUCOSE 88 139* 86 88  BUN 17 20 15 14   CREATININE 1.38* 1.22 1.10 1.15  CALCIUM 8.5* 8.4* 8.4* 8.5*   GFR: Estimated Creatinine Clearance: 73.7 mL/min (by C-G formula based on SCr of 1.15 mg/dL). Liver Function  Tests: Recent Labs  Lab 04/17/18 0730  PROT 6.1*   No results for input(s): LIPASE, AMYLASE in the last 168 hours. No results for input(s): AMMONIA in the last 168 hours. Coagulation Profile: No results for input(s): INR, PROTIME in the last 168 hours. Cardiac Enzymes: No results for input(s): CKTOTAL, CKMB, CKMBINDEX, TROPONINI in the last 168 hours. BNP (last 3 results) No results for input(s): PROBNP in the last 8760 hours. HbA1C: No results for input(s): HGBA1C in the last 72 hours. CBG: No results for input(s): GLUCAP in the last 168 hours. Lipid Profile: No results for input(s): CHOL, HDL, LDLCALC, TRIG, CHOLHDL, LDLDIRECT in the last 72 hours. Thyroid Function Tests: No results for input(s): TSH, T4TOTAL, FREET4, T3FREE, THYROIDAB in the last 72 hours. Anemia Panel: No results for input(s): VITAMINB12, FOLATE, FERRITIN, TIBC, IRON, RETICCTPCT in the last 72 hours. Urine analysis:    Component Value Date/Time   COLORURINE YELLOW 04/08/2018  2126   APPEARANCEUR CLEAR 04/08/2018 2126   LABSPEC 1.015 04/08/2018 2126   PHURINE 5.0 04/08/2018 2126   GLUCOSEU NEGATIVE 04/08/2018 2126   HGBUR NEGATIVE 04/08/2018 2126   HGBUR 2+ 09/22/2007 1444   BILIRUBINUR NEGATIVE 04/08/2018 2126   BILIRUBINUR n 11/07/2015 1417   KETONESUR NEGATIVE 04/08/2018 2126   PROTEINUR NEGATIVE 04/08/2018 2126   UROBILINOGEN 1.0 11/07/2015 1417   UROBILINOGEN 1.0 05/22/2011 1454   NITRITE NEGATIVE 04/08/2018 2126   LEUKOCYTESUR NEGATIVE 04/08/2018 2126   Sepsis Labs: @LABRCNTIP (procalcitonin:4,lacticidven:4)  ) Recent Results (from the past 240 hour(s))  MRSA PCR Screening     Status: None   Collection Time: 04/09/18  3:39 AM  Result Value Ref Range Status   MRSA by PCR NEGATIVE NEGATIVE Final    Comment:        The GeneXpert MRSA Assay (FDA approved for NASAL specimens only), is one component of a comprehensive MRSA colonization surveillance program. It is not intended to diagnose  MRSA infection nor to guide or monitor treatment for MRSA infections. Performed at [facility], [place] [address], [place], [place] [address]   Culture, body fluid-bottle     Status: None   Collection Time: 04/09/18  2:24 PM  Result Value Ref Range Status   Specimen Description PLEURAL  Final   Special Requests NONE  Final   Culture   Final    NO GROWTH 5 DAYS Performed at [facility] [address] [place], [place] [address]    Report Status 04/14/2018 FINAL  Final  Gram stain     Status: None   Collection Time: 04/09/18  2:24 PM  Result Value Ref Range Status   Specimen Description PLEURAL  Final   Special Requests NONE  Final   Gram Stain   Final    MODERATE WBC PRESENT, PREDOMINANTLY PMN NO ORGANISMS SEEN Performed at [facility], [place] [address] [place], [place] [address]    Report Status 04/09/2018 FINAL  Final  Blood culture (routine x 2)     Status: None (Preliminary result)   Collection Time: 04/14/18  8:51 PM  Result Value Ref Range Status   Specimen Description BLOOD RIGHT ANTECUBITAL  Final   Special Requests   Final    BOTTLES DRAWN AEROBIC AND ANAEROBIC Blood Culture results may not be optimal due to an inadequate volume of blood received in culture bottles   Culture   Final    NO GROWTH 4 DAYS Performed at [facility], [place] [address] [place], [place] [address]    Report Status PENDING  Incomplete  Blood culture (routine x 2)     Status: None (Preliminary result)   Collection Time: 04/14/18  8:52 PM  Result Value Ref Range Status   Specimen Description BLOOD LEFT WRIST  Final   Special Requests   Final    BOTTLES DRAWN AEROBIC AND ANAEROBIC Blood Culture results may not be optimal due to an inadequate volume of blood received in culture bottles   Culture   Final    NO GROWTH 4 DAYS Performed at [facility], [place] [address], [place], [place] [address]    Report Status PENDING  Incomplete  Culture, body fluid-bottle      Status: None (Preliminary result)   Collection Time: 04/15/18  2:35 PM  Result Value Ref Range Status   Specimen Description PLEURAL  Final   Special Requests NONE  Final   Culture   Final  NO GROWTH 3 DAYS Performed at [facility], [place] [address] [place], [place] [address]    Report Status PENDING  Incomplete  Gram stain     Status: None   Collection Time: 04/15/18  2:35 PM  Result Value Ref Range Status   Specimen Description PLEURAL  Final   Special Requests NONE  Final   Gram Stain   Final    FEW FEW WBC PRESENT,BOTH PMN AND MONONUCLEAR NO ORGANISMS SEEN NO ORGANISMS SEEN Performed at [facility], [address] [place], [place] [address]    Report Status 04/15/2018 FINAL  Final         Radiology Studies: No results found.      Scheduled Meds: . allopurinol  100 mg Oral Daily  . apixaban  5 mg Oral BID  . dextromethorphan-guaiFENesin  1 tablet Oral BID  . docusate sodium  100 mg Oral Daily  . escitalopram  20 mg Oral Daily  . furosemide  40 mg Intravenous BID  . levothyroxine  150 mcg Oral QAC breakfast  . mometasone-formoterol  2 puff Inhalation BID  . multivitamin with minerals  1 tablet Oral Daily  . nortriptyline  75 mg Oral QHS  . polyethylene glycol  17 g Oral Daily  . propranolol  10 mg Oral BID   Continuous Infusions: . ceFEPime (MAXIPIME) IV 2 g (04/18/18 0902)     LOS: 4 days    Time spent: 77min    Domenic Polite, MD [facility] Page via [email or web address], password [email or web address] After 7PM please contact night-coverage  04/18/2018, 11:50 AM

## 2018-04-18 NOTE — Progress Notes (Signed)
Pharmacy Antibiotic Note  Wayne Booth is a 77 y.o. male admitted on 04/14/2018 with pneumonia.  Pt has had recurrent pneumonias and has been admitted to ICU on ventilator in past year. Recently hospitalized on 04/08/18 for hypoxia and pleural effusion. He was started on IV vanc and cefepime. Cultures are neg to date. D/w pt on round and we will dc vanc today and cont cefepime for now.  Plan: Cefepime 2g x1, then Cefepime 2g q12h Dc vanc  Height: 5\' 11"  (180.3 cm) Weight: 268 lb 8.3 oz (121.8 kg) IBW/kg (Calculated) : 75.3  Temp (24hrs), Avg:97.7 F (36.5 C), Min:97.5 F (36.4 C), Max:97.9 F (36.6 C)  Recent Labs  Lab 04/14/18 1808 04/15/18 0242 04/16/18 0314 04/17/18 0730 04/18/18 0257  WBC 21.4* 20.3* 13.4* 10.6* 11.9*  CREATININE 1.38*  --  1.22 1.10 1.15    Estimated Creatinine Clearance: 73.7 mL/min (by C-G formula based on SCr of 1.15 mg/dL).    Allergies  Allergen Reactions  . Ambien [Zolpidem] Anxiety    Anxiety and agitation when tried as sleeper in hospital aug 2019    Antimicrobials this admission: Vancomycin 10/17 >> 10/21 Cefepime 10/17 >>   Dose adjustments this admission: N/A  Microbiology results: 10/18 Pleural>>ngtd 10/18 blood>>ngtd  Wayne Booth, PharmD, [place], AAHIVP, CPP Infectious Disease Pharmacist Pager: [phone] 04/18/2018 11:36 AM

## 2018-04-18 NOTE — Telephone Encounter (Signed)
Left detailed message on Jim's VM letting him know that per Dr. Irish Lack PT orders should be managed by PCP.

## 2018-04-18 NOTE — Progress Notes (Signed)
[facility]  Patient Status: Active (receiving services up to time of hospitalization)  [facility] is providing the following services: RN, PT and OT  If patient discharges after hours, please call [phone].   Wayne Booth 04/18/2018, 10:35 AM

## 2018-04-18 NOTE — Consult Note (Signed)
   [facility] CM Inpatient Consult   04/18/2018  Wayne Booth 08/01/1940 [id number]    Patient screened for potential [facility] Care Management services due to unplanned readmission risk score of 41% ( extreme) and multiple hospitalizations.  Went to bedside to speak with [name] about [facility] Management re-engagement. He was active with [facility] for a brief period in the past. [name] declines [facility] Management follow up at this time. He states " I have an [place] and a 24-hr number to call if I need them. I do not need any more services." He also declined post EMMI transition calls. Denies having any medication, transportation, or disease management needs.  Made inpatient RNCM aware [place] Management services were declined.   Marthenia Rolling, MSN-Ed, RN,BSN [facility] Liaison [phone]

## 2018-04-18 NOTE — Progress Notes (Signed)
Physical Therapy Treatment Patient Details Name: Wayne Booth MRN: [id number] DOB: Jun 12, 1941 Today's Date: 04/18/2018    History of Present Illness Wayne Booth is a 77 y.o. male with medical history significant of A. fib, CAD, hypertension, hypothyroidism, recurrent pneumonias over the past year and has been admitted to the ICU on the ventilator.  Patient presented for further evaluation of progressively worse dyspnea on exertion.      PT Comments    Patient received up in chair, pleasant and willing to work with PT services today, SpO2 at rest 96% on room air. Able to complete all functional transfers and gait approximately 170f with no device and S for safety on room air. Noted rigid trunk during gait as well as reduced UE swing, mild SOB by end of gait distance. SpO2 dropped to 87% following gait but able to recover to 92% on room air with seated rest and pursed lip breathing. He was left up in the chair with all needs met this morning.     Follow Up Recommendations  Home health PT     Equipment Recommendations  None recommended by PT    Recommendations for Other Services       Precautions / Restrictions Precautions Precautions: Fall Precaution Comments: watch O2 Restrictions Weight Bearing Restrictions: No    Mobility  Bed Mobility               General bed mobility comments: OOB in chair  Transfers Overall transfer level: Needs assistance Equipment used: None Transfers: Sit to/from Stand Sit to Stand: Supervision            Ambulation/Gait Ambulation/Gait assistance: Supervision Gait Distance (Feet): 120 Feet Assistive device: None Gait Pattern/deviations: Step-through pattern Gait velocity: decr   General Gait Details: mild unsteadiness with gait today, rigid trunk and UEs during gait, DOE 2/4    Stairs             Wheelchair Mobility    Modified Rankin (Stroke Patients Only)       Balance Overall balance assessment: Mild  deficits observed, not formally tested                                          Cognition Arousal/Alertness: Awake/alert Behavior During Therapy: WFL for tasks assessed/performed Overall Cognitive Status: Within Functional Limits for tasks assessed                                        Exercises      General Comments General comments (skin integrity, edema, etc.): SpO2 at rest on room air 96%, after gait [id number]%, able to recover to 92% with seated rest/pursed lip breathing on room air       Pertinent Vitals/Pain Pain Assessment: No/denies pain    Home Living                      Prior Function            PT Goals (current goals can now be found in the care plan section) Acute Rehab PT Goals Patient Stated Goal: breathe better PT Goal Formulation: With patient Time For Goal Achievement: 04/29/18 Potential to Achieve Goals: Good Progress towards PT goals: Progressing toward goals    Frequency    Min  3X/week      PT Plan      Co-evaluation              AM-PAC PT "6 Clicks" Daily Activity  Outcome Measure  Difficulty turning over in bed (including adjusting bedclothes, sheets and blankets)?: None Difficulty moving from lying on back to sitting on the side of the bed? : None Difficulty sitting down on and standing up from a chair with arms (e.g., wheelchair, bedside commode, etc,.)?: None Help needed moving to and from a bed to chair (including a wheelchair)?: A Little Help needed walking in hospital room?: A Little Help needed climbing 3-5 steps with a railing? : A Little 6 Click Score: 21    End of Session   Activity Tolerance: Patient tolerated treatment well Patient left: in chair;with call bell/phone within reach   PT Visit Diagnosis: Unsteadiness on feet (R26.81);Other abnormalities of gait and mobility (R26.89);Difficulty in walking, not elsewhere classified (R26.2)     Time: 1145-1200 PT Time  Calculation (min) (ACUTE ONLY): 15 min  Charges:  $Gait Training: 8-22 mins             Deniece Ree PT, DPT, CBIS  Supplemental Physical Therapist [place]    Pager [phone] Acute Rehab Office [phone]

## 2018-04-18 NOTE — Progress Notes (Signed)
NAME:  Wayne Booth, MRN:  [id number], DOB:  04-14-1941, LOS: 4 ADMISSION DATE:  04/14/2018, CONSULTATION DATE:  10/18 REFERRING MD:  Anastasia Fiedler, CHIEF COMPLAINT:  Recurrent PNA   Brief History   77 year old male with multiple recent admissions for respiratory failure (MRSA PNA, CHF, Effusions) again presenting with dyspnea and admitted for pneumonia with effusions. PCCM consulted.   Last admission was 10/11 -10/14 with working diagnosis of acute respiratory failure, cardiogenic pulmonary edema, bilateral pleural effusion.  Underwent left thoracentesis which showed exudate He is a non-smoker, no asbestos exposure, mold, hot tub, Jacuzzi no other known exposures.  Past Medical History  MRSA PNA, renal cell carcinoma s/p nephrectomy, HFpEF, OSA not on CPAP.    Significant Hospital Events   10/11 > 14: admit for CHF with lt thoracentesis on 10/12 10/17: admit for HCAP 10/18: Repeat lt thoracentesis, PCCM consult.   Consults: date of consult/date signed off & final recs:  IR Pulm  Procedures (surgical and bedside):  Thoracentesis 10/18-WBC 780, lymphs 45%, eos 47%, neutrophils 4%, monocyte macrophage 4%. Micro, cytology- pending  Significant Diagnostic Tests:  CT chest 10/17 > Small to moderate bilateral pleural effusions, increased as compared with CT 02/03/2018. Consolidations within the left greater than right lower lobe, and lingula which may be secondary to atelectasis or pneumonia.  Micro Data:  Blood 10/17 > Pleural 10/18 >  Antimicrobials:  Cefepime 10/17 > Vancomycin 10/17 >  Subjective:  No events overnight, no new complaints  Objective   Blood pressure 122/87, pulse 83, temperature (!) 97.5 F (36.4 C), temperature source Oral, resp. rate 17, height 5\' 11"  (1.803 m), weight 121.8 kg, SpO2 98 %.        Intake/Output Summary (Last 24 hours) at 04/18/2018 1311 Last data filed at 04/18/2018 0600 Gross per 24 hour  Intake -  Output 2075 ml  Net -2075 ml    Filed Weights   04/14/18 2200 04/15/18 0600 04/18/18 0528  Weight: 123 kg 122.9 kg 121.8 kg    Examination: Gen:      Chronically ill appearing male, NAD HEENT:  [place]/AT, PERRL, EOM-I and MMM Neck:      Supple, -masses Lungs:    CTA with decreased BS on the left CV:         RRR, Nl S1/S2 and -M/R/G Abd:      Soft, NT, ND and +BS Ext:    -edema and -tenderness Skin:      Intact Neuro:   Alert and interactive, moving all ext to command   Resolved Hospital Problem list     Assessment & Plan:  Acute hypoxemic respiratory failure Reccurent pneumonia - Titrate O2 for sat of 88-92% - IS - Ambulate - OOB - Question if he could be aspirating with recurrent PNA, but had speech eval on 8/30 indicating no issues.  Pleural effusion: bilateral, L > R  - s/p thoracentesis 10/12 and again 10/18 Reviewed pleural fluid cell count shows elevated eosinophils of unclear etiology, he also has mild peripheral eosinophilia. Differential diagnosis includes eosinophilic pneumonia vs malignancy - Autoimmune workup in processing right now, ANA is the only one back that is negative - We will check serologies for basic connective tissue disease- ANA, RF, CCP, ANCA, IgE.  If cytology and cultures remain negative then consider empiric treatment with prednisone 60 mg with slow taper. - If cytology is negative for malignancy will likely start steroids pending the auto-immune work up results  Acute on chronic HFpEF: echo from  August with LVEF 50-55% - Diureses as vitals and renal function allow  OSA (not on CPAP) - Will need a sleep study as outpatient  Disposition / Summary of Today's Plan 04/18/18   F/U on cytology, hold the course for now, ambulate, PCCM will continue to follow  Discussed with PCCM-NP and TRH-MD    Diet: heart healthy Pain/Anxiety/Delirium protocol (if indicated): n/a VAP protocol (if indicated): n/a DVT prophylaxis: eliquis GI prophylaxis: n/a Hyperglycemia protocol:  n/a Mobility: assist Code Status: FULL Family Communication: patient updated.   Labs   CBC: Recent Labs  Lab 04/14/18 1808 04/15/18 0242 04/16/18 0314 04/17/18 0730 04/18/18 0257  WBC 21.4* 20.3* 13.4* 10.6* 11.9*  NEUTROABS  --   --  9.8*  --   --   HGB 14.7 14.2 13.8 12.9* 13.1  HCT 45.1 43.2 41.9 39.9 41.1  MCV 88.8 89.3 89.0 89.1 88.8  PLT 468* 430* 404* 462* 544*    Basic Metabolic Panel: Recent Labs  Lab 04/14/18 1808 04/16/18 0314 04/17/18 0730 04/18/18 0257  NA 130* 133* 136 135  K 4.0 3.6 3.6 3.6  CL 95* 96* 99 97*  CO2 23 28 30 28   GLUCOSE 88 139* 86 88  BUN 17 20 15 14   CREATININE 1.38* 1.22 1.10 1.15  CALCIUM 8.5* 8.4* 8.4* 8.5*   GFR: Estimated Creatinine Clearance: 73.7 mL/min (by C-G formula based on SCr of 1.15 mg/dL). Recent Labs  Lab 04/15/18 0242 04/16/18 0314 04/17/18 0730 04/18/18 0257  PROCALCITON 0.32  --   --   --   WBC 20.3* 13.4* 10.6* 11.9*    Liver Function Tests: Recent Labs  Lab 04/17/18 0730  PROT 6.1*   No results for input(s): LIPASE, AMYLASE in the last 168 hours. No results for input(s): AMMONIA in the last 168 hours.  ABG    Component Value Date/Time   PHART 7.441 02/22/2018 0744   PCO2ART 44.1 02/22/2018 0744   PO2ART 81.0 (L) 02/22/2018 0744   HCO3 29.5 (H) 02/22/2018 0744   TCO2 31 02/22/2018 0744   ACIDBASEDEF 4.0 (H) 02/13/2018 0337   O2SAT 95.0 02/22/2018 0744     Coagulation Profile: No results for input(s): INR, PROTIME in the last 168 hours.  Cardiac Enzymes: No results for input(s): CKTOTAL, CKMB, CKMBINDEX, TROPONINI in the last 168 hours.  HbA1C: Hgb A1c MFr Bld  Date/Time Value Ref Range Status  12/16/2017 04:27 PM 6.3 4.6 - 6.5 % Final    Comment:    Glycemic Control Guidelines for People with Diabetes:Non Diabetic:  <6%Goal of Therapy: <7%Additional Action Suggested:  >8%   04/12/2017 08:33 AM 6.1 4.6 - 6.5 % Final    Comment:    Glycemic Control Guidelines for People with  Diabetes:Non Diabetic:  <6%Goal of Therapy: <7%Additional Action Suggested:  >8%     CBG: No results for input(s): GLUCAP in the last 168 hours.  Rush Farmer, M.D. [facility] Pulmonary/Critical Care Medicine. Pager: [phone]. After hours pager: [phone].  04/18/2018, 1:11 PM

## 2018-04-19 ENCOUNTER — Ambulatory Visit: Payer: Medicare Other | Admitting: Family Medicine

## 2018-04-19 ENCOUNTER — Inpatient Hospital Stay (HOSPITAL_COMMUNITY): Payer: Medicare Other

## 2018-04-19 LAB — BASIC METABOLIC PANEL
Anion gap: 8 (ref 5–15)
BUN: 13 mg/dL (ref 8–23)
CHLORIDE: 97 mmol/L — AB (ref 98–111)
CO2: 30 mmol/L (ref 22–32)
CREATININE: 1.16 mg/dL (ref 0.61–1.24)
Calcium: 8.9 mg/dL (ref 8.9–10.3)
GFR calc Af Amer: >60 mL/min (ref >60)
GFR calc non Af Amer: 60 mL/min — ABNORMAL LOW (ref >60)
Glucose, Bld: 117 mg/dL — ABNORMAL HIGH (ref 70–99)
POTASSIUM: 4.2 mmol/L (ref 3.5–5.1)
Sodium: 135 mmol/L (ref 135–145)

## 2018-04-19 LAB — CBC
HEMATOCRIT: 40.6 % (ref 39.0–52.0)
HEMOGLOBIN: 12.9 g/dL — AB (ref 13.0–17.0)
MCH: 28.4 pg (ref 26.0–34.0)
MCHC: 31.8 g/dL (ref 30.0–36.0)
MCV: 89.4 fL (ref 80.0–100.0)
Platelets: 536 10*3/uL — ABNORMAL HIGH (ref 150–400)
RBC: 4.54 MIL/uL (ref 4.22–5.81)
RDW: 15.3 % (ref 11.5–15.5)
WBC: 11.5 10*3/uL — ABNORMAL HIGH (ref 4.0–10.5)
nRBC: 0 % (ref 0.0–0.2)

## 2018-04-19 LAB — CULTURE, BLOOD (ROUTINE X 2)
CULTURE: NO GROWTH
Culture: NO GROWTH

## 2018-04-19 LAB — ANCA TITERS
Atypical P-ANCA titer: <1:20 {titer}
C-ANCA: <1:20 {titer}
P-ANCA: <1:20 {titer}

## 2018-04-19 LAB — RHEUMATOID FACTOR: RHEUMATOID FACTOR: 16.7 [IU]/mL — AB (ref 0.0–13.9)

## 2018-04-19 MED ORDER — AMOXICILLIN-POT CLAVULANATE 875-125 MG PO TABS: 1.0000 | ORAL_TABLET | Freq: Two times a day (BID) | ORAL | 0 refills | Status: DC

## 2018-04-19 MED ORDER — FUROSEMIDE 40 MG PO TABS
40.0000 mg | ORAL_TABLET | Freq: Two times a day (BID) | ORAL | Status: DC
  Administered 2018-04-19 – 2018-04-20 (×2): 40 mg via ORAL
  Filled 2018-04-19 (×2): qty 1

## 2018-04-19 MED ORDER — POTASSIUM CHLORIDE CRYS ER 20 MEQ PO TBCR: 20.0000 meq | EXTENDED_RELEASE_TABLET | Freq: Two times a day (BID) | ORAL | 0 refills | Status: DC

## 2018-04-19 MED ORDER — FUROSEMIDE 40 MG PO TABS: 40.0000 mg | ORAL_TABLET | Freq: Two times a day (BID) | ORAL | 0 refills | Status: DC

## 2018-04-19 NOTE — Progress Notes (Signed)
O2 Qualification with PT  SATURATION QUALIFICATIONS: (This note is used to comply with regulatory documentation for home oxygen)  Patient Saturations on Room Air at Rest = 92%  Patient Saturations on Room Air while Ambulating = 87%  Patient Saturations on 2 Liters of oxygen while Ambulating = 92%  Please briefly explain why patient needs home oxygen: Pt with O2 desaturation with exertion.   Leighton Roach, [place]  Pager [phone] Office [phone]

## 2018-04-19 NOTE — Progress Notes (Signed)
NAME:  Wayne Booth, MRN:  [id number], DOB:  [birth year], LOS: 5 ADMISSION DATE:  04/14/2018, CONSULTATION DATE:  10/18 REFERRING MD:  Anastasia Fiedler, CHIEF COMPLAINT:  Recurrent PNA   Brief History   77 year old male with multiple recent admissions for respiratory failure (MRSA PNA, CHF, Effusions) again presenting with dyspnea and admitted for pneumonia with effusions. PCCM consulted.   Last admission was 10/11 -10/14 with working diagnosis of acute respiratory failure, cardiogenic pulmonary edema, bilateral pleural effusion.  Underwent left thoracentesis which showed exudate He is a non-smoker, no asbestos exposure, mold, hot tub, Jacuzzi no other known exposures.  Past Medical History  MRSA PNA, renal cell carcinoma s/p nephrectomy, HFpEF, OSA not on CPAP.    Significant Hospital Events   10/11 > 14: admit for CHF with lt thoracentesis on 10/12 10/17: admit for HCAP 10/18: Repeat lt thoracentesis, PCCM consult.   Consults: date of consult/date signed off & final recs:  IR Pulm  Procedures (surgical and bedside):  Thoracentesis 10/18-WBC 780, lymphs 45%, eos 47%, neutrophils 4%, monocyte macrophage 4%. Micro, cytology- pending  Significant Diagnostic Tests:  CT chest 10/17 > Small to moderate bilateral pleural effusions, increased as compared with CT 02/03/2018. Consolidations within the left greater than right lower lobe, and lingula which may be secondary to atelectasis or pneumonia.  Micro Data:  Blood 10/17 > Pleural 10/18 >  Antimicrobials:  Cefepime 10/17 >10/23 Vancomycin 10/17 >10/20  Subjective:  No events overnight, feels better, no new complaints  Objective   Blood pressure 122/62, pulse 83, temperature (!) 97.3 F (36.3 C), temperature source Oral, resp. rate 20, height 5\' 11"  (1.803 m), weight 122 kg, SpO2 95 %.      No intake or output data in the 24 hours ending 04/19/18 1020 Filed Weights   04/15/18 0600 04/18/18 0528 04/19/18 0450  Weight: 122.9 kg  121.8 kg 122 kg   Examination: Gen:      Chronically ill appearing male, NAD HEENT:  [place]/AT, PERRL, EOM-I and MMM Neck:      Supple, -JVD and no masses Lungs:    Decreased BS at the bases CV:         RRR, Nl S1/S2 and -M/R/G Abd:      Soft, NT, ND and +BS Ext:    -edema and -tenderness Skin:       Intact Neuro:   Alert and oriented, moving all ext to command  Resolved Hospital Problem list     Assessment & Plan:  Acute hypoxemic respiratory failure Reccurent pneumonia - Titrate O2 for sat of 88-92% - IS - Ambulate - OOB - Abx via cefepime day 6/7, d/c after tomorrows dose  Pleural effusion: bilateral, L > R  - S/P thoracentesis 10/12 and again 10/18 Reviewed pleural fluid cell count shows elevated eosinophils of unclear etiology, he also has mild peripheral eosinophilia. Cytology is negative, so unlikely to cancer related from 2 separate taps, frequent thoracentesis have also been associated with pleural eosinophilia specially that autoimmune work up remains pending for now - ANA negative, RA is marginally positive and with glucose in the pleural fluid not low makes it less likely, CCP negative, ANCA and IgE are pending. - Abx plan as above - May go home after dose of abx in AM with f/u with PCCM with a CXR in 1 wk, appointment will be arranged by PCCM. - Will not start steroids as patient is feeling better and I believe the SOB is related to heart failure  rather than frank pulmonary disease or auto-immune  Acute on chronic HFpEF: echo from August with LVEF 50-55% - Diureses as vitals and renal function allow - Maintain even to negative fluid balance at this point as I believe the respiratory failure is mostly fluid related  OSA (not on CPAP) - Sleep study to be arranged as outpatient  Disposition / Summary of Today's Plan 04/19/18   Finish course of abx in AM Ambulatory desat study on RA for ?need for home O2 May d/c in AM F/U in 1 wk with PCCM on 10/29 Tuesday 10 AM with  tammy parret NP, office will arrange CXR prior to office visist Patient was made aware to check he O2 levels and if there is a drop or starts to feel poorly to come back to the hospital immediately, he is aware that we are choosing not start steroids to avoid complications since he is feeling so much better with diuresis.   PCCM will sign off, please call back if needed  Discussed with PCCM-NP and TRH-MD    Diet: heart healthy Pain/Anxiety/Delirium protocol (if indicated): n/a VAP protocol (if indicated): n/a DVT prophylaxis: eliquis GI prophylaxis: n/a Hyperglycemia protocol: n/a Mobility: assist Code Status: FULL Family Communication: patient updated.   Labs   CBC: Recent Labs  Lab 04/15/18 0242 04/16/18 0314 04/17/18 0730 04/18/18 0257 04/19/18 0211  WBC 20.3* 13.4* 10.6* 11.9* 11.5*  NEUTROABS  --  9.8*  --   --   --   HGB 14.2 13.8 12.9* 13.1 12.9*  HCT 43.2 41.9 39.9 41.1 40.6  MCV 89.3 89.0 89.1 88.8 89.4  PLT 430* 404* 462* 544* 536*    Basic Metabolic Panel: Recent Labs  Lab 04/14/18 1808 04/16/18 0314 04/17/18 0730 04/18/18 0257 04/19/18 0211  NA 130* 133* 136 135 135  K 4.0 3.6 3.6 3.6 4.2  CL 95* 96* 99 97* 97*  CO2 23 28 30 28 30   GLUCOSE 88 139* 86 88 117*  BUN 17 20 15 14 13   CREATININE 1.38* 1.22 1.10 1.15 1.16  CALCIUM 8.5* 8.4* 8.4* 8.5* 8.9   GFR: Estimated Creatinine Clearance: 73.2 mL/min (by C-G formula based on SCr of 1.16 mg/dL). Recent Labs  Lab 04/15/18 0242 04/16/18 0314 04/17/18 0730 04/18/18 0257 04/19/18 0211  PROCALCITON 0.32  --   --   --   --   WBC 20.3* 13.4* 10.6* 11.9* 11.5*    Liver Function Tests: Recent Labs  Lab 04/17/18 0730  PROT 6.1*   No results for input(s): LIPASE, AMYLASE in the last 168 hours. No results for input(s): AMMONIA in the last 168 hours.  ABG    Component Value Date/Time   PHART 7.441 02/22/2018 0744   PCO2ART 44.1 02/22/2018 0744   PO2ART 81.0 (L) 02/22/2018 0744   HCO3 29.5 (H)  02/22/2018 0744   TCO2 31 02/22/2018 0744   ACIDBASEDEF 4.0 (H) 02/13/2018 0337   O2SAT 95.0 02/22/2018 0744   Coagulation Profile: No results for input(s): INR, PROTIME in the last 168 hours.  Cardiac Enzymes: No results for input(s): CKTOTAL, CKMB, CKMBINDEX, TROPONINI in the last 168 hours.  HbA1C: Hgb A1c MFr Bld  Date/Time Value Ref Range Status  12/16/2017 04:27 PM 6.3 4.6 - 6.5 % Final    Comment:    Glycemic Control Guidelines for People with Diabetes:Non Diabetic:  <6%Goal of Therapy: <7%Additional Action Suggested:  >8%   04/12/2017 08:33 AM 6.1 4.6 - 6.5 % Final    Comment:    Glycemic  Control Guidelines for People with Diabetes:Non Diabetic:  <6%Goal of Therapy: <7%Additional Action Suggested:  >8%    CBG: No results for input(s): GLUCAP in the last 168 hours.  Rush Farmer, M.D. [facility] Pulmonary/Critical Care Medicine. Pager: [phone]. After hours pager: [phone].  04/19/2018, 10:20 AM

## 2018-04-19 NOTE — Progress Notes (Signed)
Physical Therapy Treatment Patient Details Name: Wayne Booth MRN: [id number] DOB: June 19, 1941 Today's Date: 04/19/2018    History of Present Illness BRENTIN SHIN is a 77 y.o. male with medical history significant of A. fib, CAD, hypertension, hypothyroidism, recurrent pneumonias over the past year and has been admitted to the ICU on the ventilator.  Patient presented for further evaluation of progressively worse dyspnea on exertion.      PT Comments    Pt at mod I level with bed mobility and transfers and supervision with gait due to occasional imbalance. O2 sats dropped to 87% on RA after 200' ambulation. Remained 92% when ambulating on 2L O2. Worked on standing balance exercises. Pt fatigued quickly and requires seated rest breaks. PT will continue to follow.    Follow Up Recommendations  Home health PT     Equipment Recommendations  None recommended by PT    Recommendations for Other Services       Precautions / Restrictions Precautions Precautions: Fall Precaution Comments: watch O2 Restrictions Weight Bearing Restrictions: No    Mobility  Bed Mobility               General bed mobility comments: OOB in chair  Transfers Overall transfer level: Modified independent Equipment used: None Transfers: Sit to/from Stand Sit to Stand: Modified independent (Device/Increase time)         General transfer comment: pt sits and stand safely from chair  Ambulation/Gait Ambulation/Gait assistance: Supervision Gait Distance (Feet): 375 Feet(200', seated rest 175') Assistive device: None Gait Pattern/deviations: Step-through pattern;Trunk flexed;Decreased stride length Gait velocity: decr Gait velocity interpretation: 1.31 - 2.62 ft/sec, indicative of limited community ambulator General Gait Details: pt ambulates with fwd head posture and decreased step length to increase his stability. Ambulated first 200' and then needed a seated rest break with O2 sats down to  87%. Returned to 92% with seated rest x2 mins. Second walk was on 2L O2 and pt still fatigued after but sats remained >90% and pt able to converse mall amt while ambulating.    Stairs             Wheelchair Mobility    Modified Rankin (Stroke Patients Only)       Balance Overall balance assessment: Needs assistance Sitting-balance support: No upper extremity supported Sitting balance-Leahy Scale: Good     Standing balance support: No upper extremity supported Standing balance-Leahy Scale: Fair Standing balance comment: worked on Building surveyor with eyes closed, needed min A due to LOB with assumption of position. Increased sway with eyes closed, min A to steady. Also worked on tandem stance with min A.                             Cognition Arousal/Alertness: Awake/alert Behavior During Therapy: WFL for tasks assessed/performed Overall Cognitive Status: Within Functional Limits for tasks assessed                                        Exercises      General Comments General comments (skin integrity, edema, etc.): [facility]      Pertinent Vitals/Pain Pain Assessment: No/denies pain    Home Living  Prior Function            PT Goals (current goals can now be found in the care plan section) Acute Rehab PT Goals Patient Stated Goal: breathe better PT Goal Formulation: With patient Time For Goal Achievement: 04/29/18 Potential to Achieve Goals: Good Progress towards PT goals: Progressing toward goals    Frequency    Min 3X/week      PT Plan Current plan remains appropriate    Co-evaluation              AM-PAC PT "6 Clicks" Daily Activity  Outcome Measure  Difficulty turning over in bed (including adjusting bedclothes, sheets and blankets)?: None Difficulty moving from lying on back to sitting on the side of the bed? :  None Difficulty sitting down on and standing up from a chair with arms (e.g., wheelchair, bedside commode, etc,.)?: None Help needed moving to and from a bed to chair (including a wheelchair)?: None Help needed walking in hospital room?: A Little Help needed climbing 3-5 steps with a railing? : A Little 6 Click Score: 22    End of Session   Activity Tolerance: Patient tolerated treatment well Patient left: in chair;with call bell/phone within reach Nurse Communication: Mobility status PT Visit Diagnosis: Unsteadiness on feet (R26.81);Other abnormalities of gait and mobility (R26.89);Difficulty in walking, not elsewhere classified (R26.2)     Time: 5427-0623 PT Time Calculation (min) (ACUTE ONLY): 24 min  Charges:  $Gait Training: 8-22 mins $Therapeutic Exercise: 8-22 mins                     Leighton Roach, [place]  Pager [phone] Office [place] 04/19/2018, 1:45 PM

## 2018-04-19 NOTE — Progress Notes (Signed)
PROGRESS NOTE    Wayne Booth  IRJ:[id number] DOB: 1940/08/10 DOA: 04/14/2018 PCP: Marin Olp, MD  Brief Narrative: Wayne Booth is a 77 y.o. male with medical history significant of A. fib, CAD, recurrent pneumonias over the past year and has been admitted to the ICU on the ventilator.  Patient presented for further evaluation of progressively worse dyspnea on exertion.   -Was just discharged from [facility] on 10/14, he was treated for pulmonary edema due to decompensated diastolic heart failure, complicated by bilateral pleural effusions, not felt to have a pneumonia at that time, procalcitonin level was less than 0.1, underwent thoracentesis, fluid was exudative by light's criteria,  fluid culture was negative, went home on lasix -Readmitted 10/17 with states he has been having dyspnea +orthopnea.  - IN ED, CT chest  Noted Small to moderate bilateral pleural effusions, increased compared with CT 02/03/2018. Consolidations within the left greater than right lower lobe, and lingula which may be secondary to atelectasis or pneumonia. -admitted, diuresed and started Abx - underwent Thoracentesis -fluid exudative with 47% eosinophils -Pulm consulted, Cytology negative -improving with IV lasix and Abx, Pulm was considering high Prednisone at discharge for Eosinophilic PNA but now plans home after completing Abx tomorrow on PO lasix, with quick FU 10/29 at [facility]  Assessment & Plan:  Acute hypoxemic respiratory failure ([place]) -Due to bilateral pleural effusions, diastolic CHF and underlying left lung pneumonia -recent admission-underwent left thoracentesis on 10/12, fluid analysis was exudative, but cultures were negative, procalcitonin level was less than 0.1 then -Prior to recent admission, had prolonged hospitalization with MRSA pneumonia, complicated by respiratory failure and intubation, August through September -now started broad spectrum Abx, IV Lasix, s/p  ultrasound-guided thoracentesis on 10/18 with 1L drained, fluid is exudative by Lights criteria and has 47% eosinophils, Cytology negative -stopped IV Vancomycin, remains on IV Cefepime, completed 5days, continue IV another day, and then PO tomorrow for 7days of Abx -blood cultures and pleural fluid cultures are negative -appreciate Pulm input, Cytology negative , 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  -Continue propranolol for rate control -Resumed Eliquis  History of PE -continue Eliquis    Hypothyroidism - Continue home synthroid.   HTN -Continue propranolol -IV diuresis as above  Gout -Continue home allopurinol  Chronic pain -Continue home Flexeril, tramadol  Depression -Continue home Lexapro, nortriptyline, trazodone  Physical deconditioning -PT eval  ? COPD -seen on chart review, no H/o Smoking or PFTs  DVT prophylaxis: SCDs/Eliquis Code Status: full code. Family Communication: No family present at bedside. Disposition Plan: Anticipate discharge  home tomorrow if stable  Consultants:   Pulm   Procedures: THOracentesis 10/18: 1L drained  Antimicrobials:  Antibiotics Given (last 72 hours)    Date/Time Action Medication Dose Rate   04/16/18 2228 New Bag/Given   ceFEPIme (MAXIPIME) 2 g in sodium chloride 0.9 % 100 mL IVPB 2 g 200 mL/hr   04/16/18 2359 New  Bag/Given   vancomycin  (VANCOCIN) 1,500 mg in sodium chloride 0.9 % 500 mL IVPB 1,500 mg 250 mL/hr   04/17/18 0952 New Bag/Given   ceFEPIme (MAXIPIME) 2 g in sodium chloride 0.9 % 100 mL IVPB 2 g 200 mL/hr   04/17/18 2230 New Bag/Given   ceFEPIme (MAXIPIME) 2 g in sodium chloride 0.9 % 100 mL IVPB 2 g 200 mL/hr   04/17/18 2310 New Bag/Given   vancomycin (VANCOCIN) 1,500 mg in sodium chloride 0.9 % 500 mL IVPB 1,500 mg 250 mL/hr   04/18/18 0902 New Bag/Given   ceFEPIme (MAXIPIME) 2 g in sodium chloride 0.9 % 100 mL IVPB 2 g 200 mL/hr   04/19/18 0005 New Bag/Given   ceFEPIme (MAXIPIME) 2 g in sodium chloride 0.9 % 100 mL IVPB 2 g 200 mL/hr   04/19/18 0930 New Bag/Given   ceFEPIme (MAXIPIME) 2 g in sodium chloride 0.9 % 100 mL IVPB 2 g 200 mL/hr      Subjective: -breathing much better, almost back to baseline, told by [facility] to stay till tomorrow   Objective: Vitals:   04/18/18 2320 04/19/18 0450 04/19/18 0736 04/19/18 0746  BP:   122/62   Pulse:   83   Resp:      Temp: 97.9 F (36.6 C)  (!) 97.3 F (36.3 C)   TempSrc: Oral  Oral   SpO2:   97% 95%  Weight:  122 kg    Height:       No intake or output data in the 24 hours ending 04/19/18 1336 Filed Weights   04/15/18 0600 04/18/18 0528 04/19/18 0450  Weight: 122.9 kg 121.8 kg 122 kg    Examination:  Gen: Awake, Alert, Oriented X 3, obese male, no distress HEENT: PERRLA, Neck supple, no JVD Lungs: improved air movement bilaterally, Clear bilaterally CVS: S1S2/RRR Abd: soft, Non tender, non distended, BS present Extremities: trace edema Skin: no new rashes Psychiatry: Judgement and insight appear normal. Mood & affect appropriate.     Data Reviewed:   CBC: Recent Labs  Lab 04/15/18 0242 04/16/18 0314 04/17/18 0730 04/18/18 0257 04/19/18 0211  WBC 20.3* 13.4* 10.6* 11.9* 11.5*  NEUTROABS  --  9.8*  --   --   --   HGB 14.2 13.8 12.9* 13.1 12.9*  HCT 43.2 41.9 39.9 41.1 40.6  MCV 89.3 89.0 89.1 88.8 89.4  PLT 430* 404* 462* 544* 536*    Basic Metabolic Panel: Recent Labs  Lab 04/14/18 1808 04/16/18 0314 04/17/18 0730 04/18/18 0257 04/19/18 0211  NA 130* 133* 136 135 135  K 4.0 3.6 3.6 3.6 4.2  CL 95* 96* 99 97* 97*  CO2 23 28 30 28 30   GLUCOSE 88 139* 86 88 117*  BUN 17 20 15 14 13   CREATININE 1.38* 1.22 1.10 1.15 1.16  CALCIUM 8.5* 8.4* 8.4* 8.5* 8.9   GFR: Estimated Creatinine Clearance: 73.2 mL/min (by C-G formula based on SCr of 1.16 mg/dL). Liver Function Tests: Recent Labs  Lab 04/17/18 0730  PROT 6.1*   No results for input(s): LIPASE, AMYLASE in the last 168 hours. No results for input(s): AMMONIA in the last 168 hours. Coagulation Profile: No results for input(s): INR, PROTIME in the last 168 hours. Cardiac Enzymes: No results for input(s): CKTOTAL, CKMB, CKMBINDEX, TROPONINI in the last 168 hours. BNP (last 3 results) No results for input(s): PROBNP in the last 8760 hours. HbA1C: No results for input(s): HGBA1C in the last 72 hours. CBG: No results for input(s):  GLUCAP in the last 168 hours. Lipid Profile: No results for input(s): CHOL, HDL, LDLCALC, TRIG, CHOLHDL, LDLDIRECT in the last 72 hours. Thyroid Function Tests: No results for input(s): TSH, T4TOTAL, FREET4, T3FREE, THYROIDAB in the last 72 hours. Anemia Panel: No results for input(s): VITAMINB12, FOLATE, FERRITIN, TIBC, IRON, RETICCTPCT in the last 72 hours. Urine analysis:    Component Value Date/Time   COLORURINE YELLOW 04/08/2018 2126   APPEARANCEUR CLEAR 04/08/2018 2126   LABSPEC 1.015 04/08/2018 2126   PHURINE 5.0 04/08/2018 2126   GLUCOSEU NEGATIVE 04/08/2018 2126   HGBUR NEGATIVE 04/08/2018 2126   HGBUR 2+ 09/22/2007 1444   BILIRUBINUR NEGATIVE 04/08/2018 2126   BILIRUBINUR n 11/07/2015 1417   KETONESUR NEGATIVE 04/08/2018 2126   PROTEINUR NEGATIVE 04/08/2018 2126   UROBILINOGEN 1.0 11/07/2015 1417   UROBILINOGEN 1.0 05/22/2011 1454   NITRITE NEGATIVE 04/08/2018 2126   LEUKOCYTESUR NEGATIVE 04/08/2018 2126    Sepsis Labs: @LABRCNTIP (procalcitonin:4,lacticidven:4)  ) Recent Results (from the past 240 hour(s))  Culture, body fluid-bottle     Status: None   Collection Time: 04/09/18  2:24 PM  Result Value Ref Range Status   Specimen Description PLEURAL  Final   Special Requests NONE  Final   Culture   Final    NO GROWTH 5 DAYS Performed at [facility], [place] [address], [place], [place] [address]    Report Status 04/14/2018 FINAL  Final  Gram stain     Status: None   Collection Time: 04/09/18  2:24 PM  Result Value Ref Range Status   Specimen Description PLEURAL  Final   Special Requests NONE  Final   Gram Stain   Final    MODERATE WBC PRESENT, PREDOMINANTLY PMN NO ORGANISMS SEEN Performed at [facility], [place] [address], [place], [place] [address]    Report Status 04/09/2018 FINAL  Final  Blood culture (routine x 2)     Status: None   Collection Time: 04/14/18  8:51 PM  Result Value Ref Range Status   Specimen Description BLOOD RIGHT ANTECUBITAL  Final   Special Requests   Final    BOTTLES DRAWN AEROBIC AND ANAEROBIC Blood Culture results may not be optimal due to an inadequate volume of blood received in culture bottles   Culture   Final    NO GROWTH 5 DAYS Performed at [facility], [place] [address], [place], [place] [address]    Report Status 04/19/2018 FINAL  Final  Blood culture (routine x 2)     Status: None   Collection Time: 04/14/18  8:52 PM  Result Value Ref Range Status   Specimen Description BLOOD LEFT WRIST  Final   Special Requests   Final    BOTTLES DRAWN AEROBIC AND ANAEROBIC Blood Culture results may not be optimal due to an inadequate volume of blood received in culture bottles   Culture   Final    NO GROWTH 5 DAYS Performed at [facility], [place] [address], [place], [place] [address]    Report Status 04/19/2018 FINAL  Final  Culture, body fluid-bottle     Status: None (Preliminary result)   Collection Time: 04/15/18  2:35 PM   Result Value Ref Range Status   Specimen Description PLEURAL  Final   Special Requests NONE  Final   Culture   Final    NO GROWTH 4 DAYS Performed at [place] [address], [place], [place] [address]    Report Status PENDING  Incomplete  Gram stain     Status: None   Collection Time: 04/15/18  2:35 PM  Result Value Ref Range Status   Specimen Description PLEURAL  Final   Special Requests NONE  Final   Gram Stain   Final    FEW FEW WBC PRESENT,BOTH PMN AND MONONUCLEAR NO ORGANISMS SEEN NO ORGANISMS SEEN Performed at [facility], [address] [place], [place] [address]    Report Status 04/15/2018 FINAL  Final         Radiology Studies: Dg Chest Port 1 View  Result Date: 04/19/2018 CLINICAL DATA:  77 year old male with pleural effusions. Subsequent encounter. EXAM: PORTABLE CHEST 1 VIEW COMPARISON:  04/15/2018. FINDINGS: Cardiomegaly. Pulmonary vascular congestion slightly improved. Bilateral pleural effusions and basilar atelectasis once again noted. Limited for evaluating for lung base infiltrate or mass. Calcified aorta. No pneumothorax detected. IMPRESSION: 1. Cardiomegaly. 2. Pulmonary vascular congestion slightly improved. 3. Bilateral pleural effusions and basilar atelectasis once again noted. 4.  Aortic Atherosclerosis (ICD10-I70.0). Electronically Signed   By: Genia Del M.D.   On: 04/19/2018 10:03        Scheduled Meds: . allopurinol  100 mg Oral Daily  . apixaban  5 mg Oral BID  . dextromethorphan-guaiFENesin  1 tablet Oral BID  . docusate sodium  100 mg Oral Daily  . escitalopram  20 mg Oral Daily  . furosemide  40 mg Intravenous BID  . levothyroxine  150 mcg Oral QAC breakfast  . mometasone-formoterol  2 puff Inhalation BID  . multivitamin with minerals  1 tablet Oral Daily  . nortriptyline  75 mg Oral QHS  . polyethylene glycol  17 g Oral Daily  . potassium chloride  40 mEq Oral BID  . propranolol  10 mg Oral BID   Continuous  Infusions: . ceFEPime (MAXIPIME) IV 2 g (04/19/18 0930)     LOS: 5 days    Time spent: 8min    Domenic Polite, MD [facility] Page via [email or web address], password [email or web address] After 7PM please contact night-coverage  04/19/2018, 1:36 PM

## 2018-04-19 NOTE — Discharge Summary (Addendum)
Physician Discharge Summary  Wayne Booth RSW:[id number] DOB: 1941-02-07 DOA: 04/14/2018  PCP: Marin Olp, MD  Admit date: 04/14/2018 Discharge date: 04/19/2018  Time spent: 45 minutes  Recommendations for Outpatient Follow-up:  1. PCP in 1 week 2. [facility] Tammy Parrett 10/29 at 10am wth CXR 3. Home health PT/RN   Discharge Diagnoses:  Principal Problem:   Acute hypoxemic respiratory failure (HCC)   Acute on chronic Diastolic CHF   Pneumonia   Eosinophilic effusion    Essential hypertension   Asthmatic bronchitis   Hypoxemia   Hypothyroidism   Gout   Atrial flutter (HCC)   HCAP (healthcare-associated pneumonia)   CKD (chronic kidney disease) stage 3, GFR 30-59 ml/min (HCC)   Depression   Chronic pain   Pleural effusion   First degree AV block   Thrombocytosis (HCC)   Hyponatremia   History of pulmonary embolus (PE)   Physical deconditioning   Discharge Condition: stable  Diet recommendation: Low Sodium heart healthy  Filed Weights   04/15/18 0600 04/18/18 0528 04/19/18 0450  Weight: 122.9 kg 121.8 kg 122 kg    History of present illness:  Wayne Booth a 77 y.o.malewith medical history significant ofA. fib, CAD,recurrent pneumonias over the past year and has been admitted to the ICU on the ventilator.Patient presented for further evaluation of progressively worse dyspnea on exertion.  -Was just discharged from [facility] on 10/14, he was treated for pulmonary edema due to decompensated diastolic heart failure, complicated by bilateral pleural effusions, not felt to have a pneumonia at that time, procalcitonin level was less than 0.1, underwent thoracentesis, fluid was exudative by light's criteria,  fluid culture was negative, went home on lasix -Readmitted 10/17 with states he has been having dyspnea +orthopnea.   Hospital Course:   Acute hypoxemic respiratory failure (HCC) -Due to bilateral pleural effusions, diastolic CHF  and underlying left lung pneumonia -recent admission-underwent left thoracentesis on 10/12, fluid analysis was exudative, but cultures were negative, procalcitonin level was less than 0.1 then -Prior to recent admission, had prolonged hospitalization with MRSA pneumonia, complicated by respiratory failure and intubation, August through September -now treated with broad spectrum Abx, IV Lasix, s/p ultrasound-guided thoracentesis on 10/18 with 1L drained, fluid is exudative by Lights criteria and has 47% eosinophils, Cytology was negative -stopped IV Vancomycin, remains on IV Cefepime, completed 5days, continue IV another day, and then change to PO Augmentin tomorrow  for 1day to complete 7days of Abx -blood cultures and pleural fluid cultures are negative -appreciate Pulm input, Cytology negative, may have Eosinophilic PNA-pulm was planning to consider Steroids for Eosinophilic PNA but now plans home tomorrow on PO lasix, with quick FU 10/29 at [facility], after completing Abx course -clinically much better today, off O2 now -DC home with HHPT and RN  Acute on chronic diastolic CHF -Echo in August with EF of 50% -Diuresed With Iv lasix, I/O inaccurate, negative >3L -transitioned to PO Lasix today -discharge home on atleast 40mg  BID tomorrow  Mild hyponatremia -improved with diuresis  Chronic kidney disease stage III -Stable, creatinine at baseline of 1.3-1.5  Paroxysmal atrial flutter -CHA2DS2VASc 7.  -Continue propranolol for rate control -Resumed Eliquis  History of PE -continue Eliquis    Hypothyroidism - Continue home synthroid.   HTN -Continue propranolol -stable  Gout -Continue home allopurinol  Chronic pain -Continue home Flexeril, tramadol  Depression -Continue home Lexapro, nortriptyline, trazodone  Physicaldeconditioning -PT eval completed, HHPT recommended  Discharge Exam: Vitals:   04/19/18 0736  04/19/18 0746  BP: 122/62   Pulse: 83   Resp:     Temp: (!) 97.3 F (36.3 C)   SpO2: 97% 95%    General: AAOx3 Cardiovascular: S1S2/RRR Respiratory: improved air movement, rare basilar ronchi  Discharge Instructions   Discharge Instructions    Diet - low sodium heart healthy   Complete by:  As directed    Increase activity slowly   Complete by:  As directed      Allergies as of 04/19/2018      Reactions   Ambien [zolpidem] Anxiety   Anxiety and agitation when tried as sleeper in hospital aug 2019      Medication List    STOP taking these medications   budesonide-formoterol 160-4.5 MCG/ACT inhaler Commonly known as:  SYMBICORT     TAKE these medications   albuterol 108 (90 Base) MCG/ACT inhaler Commonly known as:  PROVENTIL HFA;VENTOLIN HFA Inhale 2 puffs into the lungs every 6 (six) hours as needed for wheezing or shortness of breath.   allopurinol 100 MG tablet Commonly known as:  ZYLOPRIM Take 1 tablet (100 mg total) by mouth daily.   amoxicillin-clavulanate 875-125 MG tablet Commonly known as:  AUGMENTIN Take 1 tablet by mouth 2 (two) times daily for 1 day.   apixaban 5 MG Tabs tablet Commonly known as:  ELIQUIS Take 1 tablet (5 mg total) by mouth 2 (two) times daily.   colchicine 0.6 MG tablet Take 1 tablet (0.6 mg total) by mouth 2 (two) times daily.   cyclobenzaprine 10 MG tablet Commonly known as:  FLEXERIL Take 1 tablet (10 mg total) by mouth 3 (three) times daily as needed for muscle spasms.   docusate sodium 100 MG capsule Commonly known as:  COLACE Take 1 capsule (100 mg total) by mouth daily.   escitalopram 20 MG tablet Commonly known as:  LEXAPRO Take 1 tablet (20 mg total) by mouth daily.   furosemide 40 MG tablet Commonly known as:  LASIX Take 1 tablet (40 mg total) by mouth 2 (two) times daily. What changed:  when to take this   guaiFENesin-dextromethorphan 100-10 MG/5ML syrup Commonly known as:  ROBITUSSIN DM Take 5 mLs by mouth every 6 (six) hours as needed for cough.    levothyroxine 150 MCG tablet Commonly known as:  SYNTHROID, LEVOTHROID Take 1 tablet (150 mcg total) by mouth daily.   multivitamins ther. w/minerals Tabs tablet Take 1 tablet by mouth daily.   nortriptyline 75 MG capsule Commonly known as:  PAMELOR Take 1 capsule (75 mg total) by mouth at bedtime.   polyethylene glycol packet Commonly known as:  MIRALAX / GLYCOLAX Take 17 g by mouth daily.   potassium chloride SA 20 MEQ tablet Commonly known as:  K-DUR,KLOR-CON Take 1 tablet (20 mEq total) by mouth 2 (two) times daily.   propranolol 10 MG tablet Commonly known as:  INDERAL TAKE 1 TABLET (10 MG TOTAL) BY MOUTH 2 (TWO) TIMES DAILY. What changed:    how much to take  how to take this  when to take this  additional instructions   traMADol 50 MG tablet Commonly known as:  ULTRAM Take 1 tablet (50 mg total) by mouth every 6 (six) hours as needed for moderate pain.   traZODone 50 MG tablet Commonly known as:  DESYREL TAKE ONE HALF TO ONE TABLET BY MOUTH EVERY NIGHT AT BEDTIME AS NEEDED FOR SLEEP What changed:  See the new instructions.      Allergies  Allergen Reactions  .  Ambien [Zolpidem] Anxiety    Anxiety and agitation when tried as sleeper in hospital aug 2019      The results of significant diagnostics from this hospitalization (including imaging, microbiology, ancillary and laboratory) are listed below for reference.    Significant Diagnostic Studies: Dg Chest 1 View  Result Date: 04/15/2018 CLINICAL DATA:  Followup left-sided thoracentesis. EXAM: CHEST  1 VIEW COMPARISON:  04/14/2018 FINDINGS: Allowing for a poor inspiration and lordotic AP positioning, there appears to be less pleural fluid on the left, though some does remain. No pneumothorax is seen. Basilar atelectasis persists left more than right. IMPRESSION: No pneumothorax seen on the left following thoracentesis. Somewhat less pleural fluid. See above. Electronically Signed   By: Nelson Chimes M.D.    On: 04/15/2018 14:02   Dg Chest 1 View  Result Date: 04/09/2018 CLINICAL DATA:  Status post thoracentesis, 1 L removed. EXAM: CHEST  1 VIEW COMPARISON:  Chest x-ray dated 04/08/2018. FINDINGS: Improved aeration at the LEFT lung base. No pneumothorax seen. RIGHT lung remains clear. Heart size and mediastinal contours are stable. IMPRESSION: Improved aeration at the LEFT lung base status post thoracentesis. No pneumothorax seen. Electronically Signed   By: Franki Cabot M.D.   On: 04/09/2018 14:47   Dg Chest 2 View  Result Date: 04/14/2018 CLINICAL DATA:  Shortness of breath EXAM: CHEST - 2 VIEW COMPARISON:  04/10/2018, 04/09/2018, 04/08/2018 FINDINGS: Small right pleural effusion and small to moderate left pleural effusion, may be slightly increased on the left. Persistent consolidation at the lingula and left base. Enlarged cardiomediastinal silhouette with aortic atherosclerosis. Streaky atelectasis at the right base. No pneumothorax. IMPRESSION: 1. Small right pleural effusion and small moderate left pleural effusion, possible slight increase on the left side 2. No change in dense airspace disease at the lingula and left base. 3. Cardiomegaly Electronically Signed   By: Donavan Foil M.D.   On: 04/14/2018 19:47   Dg Chest 2 View  Result Date: 04/08/2018 CLINICAL DATA:  Shortness of breath EXAM: CHEST - 2 VIEW COMPARISON:  02/22/2018 FINDINGS: Cardiac enlargement. Pulmonary vascularity is normal. Moderate left and small right pleural effusions. Basilar atelectasis or consolidation more prominent on the left. This is progressing since previous study. No pneumothorax. Calcification of the aorta. Degenerative changes in the spine. IMPRESSION: Bilateral pleural effusions with basilar atelectasis or consolidation, greater on the left. Progression since previous study. Electronically Signed   By: Lucienne Capers M.D.   On: 04/08/2018 22:17   Ct Chest Wo Contrast  Result Date: 04/15/2018 CLINICAL  DATA:  Shortness of breath EXAM: CT CHEST WITHOUT CONTRAST TECHNIQUE: Multidetector CT imaging of the chest was performed following the standard protocol without IV contrast. COMPARISON:  Chest x-ray 04/14/2018, CT chest 02/03/2018, CT abdomen pelvis 05/22/2011 FINDINGS: Cardiovascular: Limited evaluation without intravenous contrast. Mild aortic atherosclerosis. No aneurysmal dilatation. Mild coronary vascular calcification. Borderline to mild cardiomegaly. Small pericardial effusion measuring up to 8 mm in maximum thickness anteriorly. Mediastinum/Nodes: Midline trachea. No thyroid mass. Borderline lymph nodes measuring up to 1 cm, mildly decreased in size compared to prior chest CT. Esophagus within normal limits. Lungs/Pleura: Small moderate bilateral pleural effusions, increased compared with prior CT. Consolidation within the lingula and left lower lobe as well as the right lower lobe. Scattered foci of hazy density in the upper lobes suggesting edema. 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 No acute abnormality Musculoskeletal: Degenerative changes.  No acute osseous abnormality IMPRESSION: 1. Small to moderate bilateral  pleural effusions, increased as compared with CT 02/03/2018. Consolidations within the left greater than right lower lobe, and lingula which may be secondary to atelectasis or pneumonia. 2. Background hazy density in the left greater than right upper lobes, possible edema. Small pericardial effusion. Aortic Atherosclerosis (ICD10-I70.0). Electronically Signed   By: Donavan Foil M.D.   On: 04/15/2018 01:54   Dg Chest Port 1 View  Result Date: 04/19/2018 CLINICAL DATA:  77 year old male with pleural effusions. Subsequent encounter. EXAM: PORTABLE CHEST 1 VIEW COMPARISON:  04/15/2018. FINDINGS: Cardiomegaly. Pulmonary vascular congestion slightly improved. Bilateral pleural effusions and basilar atelectasis once again noted. Limited for evaluating  for lung base infiltrate or mass. Calcified aorta. No pneumothorax detected. IMPRESSION: 1. Cardiomegaly. 2. Pulmonary vascular congestion slightly improved. 3. Bilateral pleural effusions and basilar atelectasis once again noted. 4.  Aortic Atherosclerosis (ICD10-I70.0). Electronically Signed   By: Genia Del M.D.   On: 04/19/2018 10:03   Dg Chest Port 1 View  Result Date: 04/10/2018 CLINICAL DATA:  Difficulty breathing EXAM: PORTABLE CHEST 1 VIEW COMPARISON:  04/09/2018 FINDINGS: Cardiac shadow is enlarged but stable. The lungs are well aerated bilaterally. Elevation of left hemidiaphragm is noted. No pneumothorax is seen. Minimal residual pleural effusion on the left is noted. No bony abnormality is noted. IMPRESSION: Elevation of the left hemidiaphragm. Minimal left pleural effusion is seen. No pneumothorax is noted. Electronically Signed   By: Inez Catalina M.D.   On: 04/10/2018 08:13   Ir Thoracentesis Asp Pleural Space W/img Guide  Result Date: 04/15/2018 INDICATION: Patient with history of decompensated heart failure, acute hypoxemic respiratory failure, and bilateral pleural effusion L>R. Request is made for diagnostic and therapeutic left thoracentesis. EXAM: ULTRASOUND GUIDED DIAGNOSTIC AND THERAPEUTIC LEFT THORACENTESIS MEDICATIONS: 10 mL of 1% lidocaine COMPLICATIONS: None immediate. PROCEDURE: An ultrasound guided thoracentesis was thoroughly discussed with the patient and questions answered. The benefits, risks, alternatives and complications were also discussed. The patient understands and wishes to proceed with the procedure. Written consent was obtained. Ultrasound was performed to localize and mark an adequate pocket of fluid in the left chest. The area was then prepped and draped in the normal sterile fashion. 1% Lidocaine was used for local anesthesia. Under ultrasound guidance a 6 Fr Safe-T-Centesis catheter was introduced. Thoracentesis was performed. The catheter was removed and a  dressing applied. FINDINGS: A total of approximately 1.0 L of clear yellow fluid was removed. Samples were sent to the laboratory as requested by the clinical team. IMPRESSION: Successful ultrasound guided left thoracentesis yielding 1.0 L of pleural fluid. Read by: Earley Abide, PA-C Electronically Signed   By: Marybelle Killings M.D.   On: 04/15/2018 14:05   US Thoracentesis Asp Pleural Space W/img Guide  Result Date: 04/09/2018 INDICATION: History of a.fib, CAD, CHF, prostate cancer, CKD III. Recently admitted to [facility] for dyspnea on exertion - found to be in acute hypoxic respiratory failure due to pulmonary edema and left pleural effusion. Request for diagnostic and therapeutic thoracentesis today. EXAM: ULTRASOUND GUIDED LEFT THORACENTESIS MEDICATIONS: 10 mL 1% lidocaine. COMPLICATIONS: None immediate. PROCEDURE: An ultrasound guided thoracentesis was thoroughly discussed with the patient and questions answered. The benefits, risks, alternatives and complications were also discussed. The patient understands and wishes to proceed with the procedure. Written consent was obtained. Ultrasound was performed to localize and mark an adequate pocket of fluid in the left chest. The area was then prepped and draped in the normal sterile fashion. 1% Lidocaine was used for local anesthesia. Under ultrasound guidance a  6 Fr Safe-T-Centesis catheter was introduced. Thoracentesis was performed. The catheter was removed and a dressing applied. FINDINGS: A total of approximately 1.0 L of yellow fluid was removed. Samples were sent to the laboratory as requested by the clinical team. IMPRESSION: Successful ultrasound guided left thoracentesis yielding 1.0 L of pleural fluid. Read by Candiss Norse, PA-C Electronically Signed   By: Markus Daft M.D.   On: 04/09/2018 14:50    Microbiology: Recent Results (from the past 240 hour(s))  Culture, body fluid-bottle     Status: None   Collection Time: 04/09/18  2:24 PM  Result  Value Ref Range Status   Specimen Description PLEURAL  Final   Special Requests NONE  Final   Culture   Final    NO GROWTH 5 DAYS Performed at [facility], [address] [place], [place] [address]    Report Status 04/14/2018 FINAL  Final  Gram stain     Status: None   Collection Time: 04/09/18  2:24 PM  Result Value Ref Range Status   Specimen Description PLEURAL  Final   Special Requests NONE  Final   Gram Stain   Final    MODERATE WBC PRESENT, PREDOMINANTLY PMN NO ORGANISMS SEEN Performed at [facility], [place] [address] [place], [place] [address]    Report Status 04/09/2018 FINAL  Final  Blood culture (routine x 2)     Status: None   Collection Time: 04/14/18  8:51 PM  Result Value Ref Range Status   Specimen Description BLOOD RIGHT ANTECUBITAL  Final   Special Requests   Final    BOTTLES DRAWN AEROBIC AND ANAEROBIC Blood Culture results may not be optimal due to an inadequate volume of blood received in culture bottles   Culture   Final    NO GROWTH 5 DAYS Performed at [facility], [place] [address] [place], [place] [address]    Report Status 04/19/2018 FINAL  Final  Blood culture (routine x 2)     Status: None   Collection Time: 04/14/18  8:52 PM  Result Value Ref Range Status   Specimen Description BLOOD LEFT WRIST  Final   Special Requests   Final    BOTTLES DRAWN AEROBIC AND ANAEROBIC Blood Culture results may not be optimal due to an inadequate volume of blood received in culture bottles   Culture   Final    NO GROWTH 5 DAYS Performed at [facility], [place] [address], [place], [place] [address]    Report Status 04/19/2018 FINAL  Final  Culture, body fluid-bottle     Status: None (Preliminary result)   Collection Time: 04/15/18  2:35 PM  Result Value Ref Range Status   Specimen Description PLEURAL  Final   Special Requests NONE  Final   Culture   Final    NO GROWTH 4 DAYS Performed at [facility] [address]  [place], [place] [address]    Report Status PENDING  Incomplete  Gram stain     Status: None   Collection Time: 04/15/18  2:35 PM  Result Value Ref Range Status   Specimen Description PLEURAL  Final   Special Requests NONE  Final   Gram Stain   Final    FEW FEW WBC PRESENT,BOTH PMN AND MONONUCLEAR NO ORGANISMS SEEN NO ORGANISMS SEEN Performed at [facility], [address], [place],  [address]    Report Status 04/15/2018 FINAL  Final  Labs: Basic Metabolic Panel: Recent Labs  Lab 04/14/18 1808 04/16/18 0314 04/17/18 0730 04/18/18 0257 04/19/18 0211  NA 130* 133* 136 135 135  K 4.0 3.6 3.6 3.6 4.2  CL 95* 96* 99 97* 97*  CO2 23 28 30 28 30   GLUCOSE 88 139* 86 88 117*  BUN 17 20 15 14 13   CREATININE 1.38* 1.22 1.10 1.15 1.16  CALCIUM 8.5* 8.4* 8.4* 8.5* 8.9   Liver Function Tests: Recent Labs  Lab 04/17/18 0730  PROT 6.1*   No results for input(s): LIPASE, AMYLASE in the last 168 hours. No results for input(s): AMMONIA in the last 168 hours. CBC: Recent Labs  Lab 04/15/18 0242 04/16/18 0314 04/17/18 0730 04/18/18 0257 04/19/18 0211  WBC 20.3* 13.4* 10.6* 11.9* 11.5*  NEUTROABS  --  9.8*  --   --   --   HGB 14.2 13.8 12.9* 13.1 12.9*  HCT 43.2 41.9 39.9 41.1 40.6  MCV 89.3 89.0 89.1 88.8 89.4  PLT 430* 404* 462* 544* 536*   Cardiac Enzymes: No results for input(s): CKTOTAL, CKMB, CKMBINDEX, TROPONINI in the last 168 hours. BNP: BNP (last 3 results) Recent Labs    02/14/18 0500 04/08/18 2241 04/14/18 1808  BNP 145.0* 62.9 178.0*    ProBNP (last 3 results) No results for input(s): PROBNP in the last 8760 hours.  CBG: No results for input(s): GLUCAP in the last 168 hours.     Signed:  Domenic Polite MD.  [facility] 04/19/2018, 1:54 PM

## 2018-04-20 ENCOUNTER — Telehealth: Payer: Self-pay | Admitting: *Deleted

## 2018-04-20 LAB — BASIC METABOLIC PANEL WITH GFR
Anion gap: 9 (ref 5–15)
BUN: 14 mg/dL (ref 8–23)
CO2: 26 mmol/L (ref 22–32)
Calcium: 9 mg/dL (ref 8.9–10.3)
Chloride: 101 mmol/L (ref 98–111)
Creatinine, Ser: 1.16 mg/dL (ref 0.61–1.24)
GFR calc Af Amer: >60 mL/min
GFR calc non Af Amer: 60 mL/min — ABNORMAL LOW
Glucose, Bld: 96 mg/dL (ref 70–99)
Potassium: 5.2 mmol/L — ABNORMAL HIGH (ref 3.5–5.1)
Sodium: 136 mmol/L (ref 135–145)

## 2018-04-20 LAB — CBC
HEMATOCRIT: 43.2 % (ref 39.0–52.0)
Hemoglobin: 13.9 g/dL (ref 13.0–17.0)
MCH: 28.8 pg (ref 26.0–34.0)
MCHC: 32.2 g/dL (ref 30.0–36.0)
MCV: 89.6 fL (ref 80.0–100.0)
NRBC: 0 % (ref 0.0–0.2)
Platelets: 527 10*3/uL — ABNORMAL HIGH (ref 150–400)
RBC: 4.82 MIL/uL (ref 4.22–5.81)
RDW: 15.4 % (ref 11.5–15.5)
WBC: 12.1 10*3/uL — ABNORMAL HIGH (ref 4.0–10.5)

## 2018-04-20 LAB — CULTURE, BODY FLUID-BOTTLE: CULTURE: NO GROWTH

## 2018-04-20 LAB — CULTURE, BODY FLUID W GRAM STAIN -BOTTLE

## 2018-04-20 MED ORDER — AMOXICILLIN-POT CLAVULANATE 875-125 MG PO TABS: 1.0000 | ORAL_TABLET | Freq: Two times a day (BID) | ORAL | Status: DC

## 2018-04-20 MED ORDER — AMOXICILLIN-POT CLAVULANATE 875-125 MG PO TABS
1.0000 | ORAL_TABLET | Freq: Two times a day (BID) | ORAL | Status: DC
  Administered 2018-04-20: 1 via ORAL
  Filled 2018-04-20: qty 1

## 2018-04-20 MED ORDER — AMOXICILLIN-POT CLAVULANATE 875-125 MG PO TABS: 1.0000 | ORAL_TABLET | Freq: Two times a day (BID) | ORAL | 0 refills | Status: AC

## 2018-04-20 MED FILL — AMOX-CLAV 875-125 MG TABLET: 875-125 | 1 days supply | Qty: 1 | Fill #0

## 2018-04-20 NOTE — Telephone Encounter (Signed)
Okay for verbal orders. 

## 2018-04-20 NOTE — Discharge Instructions (Signed)

## 2018-04-20 NOTE — Telephone Encounter (Signed)
Spoke with Clair Gulling from [facility]. States orders are currently on hold since pt is in hospital.

## 2018-04-20 NOTE — Telephone Encounter (Signed)
Left voicemail requesting call back.  

## 2018-04-20 NOTE — Telephone Encounter (Signed)
Still hospitalized- may provide verbal orders for visit once discharged

## 2018-04-21 NOTE — Telephone Encounter (Signed)
Wayne Booth A 04/20/2018 05:23 PM  Pt called back in, pt already has an appt on Friday,  He would like to keep that appt but it is only 57mins.  I could not expand it to 30 .  Just sending fyi pt would like to keep appt  Friday.  Wasn't sure if you wanted to use the same day after it to expand appt to 30 for hosp?

## 2018-04-21 NOTE — Progress Notes (Signed)
PROGRESS NOTE    Wayne Booth  MPN:[id number] DOB: [birth year] DOA: 04/14/2018 PCP: Marin Olp, MD  Brief Narrative: Wayne Booth is a 77 y.o. male with medical history significant of A. fib, CAD, recurrent pneumonias over the past year and has been admitted to the ICU on the ventilator.  Patient presented for further evaluation of progressively worse dyspnea on exertion.   -Was just discharged from [facility] on 10/14, he was treated for pulmonary edema due to decompensated diastolic heart failure, complicated by bilateral pleural effusions, not felt to have a pneumonia at that time, procalcitonin level was less than 0.1, underwent thoracentesis, fluid was exudative by light's criteria,  fluid culture was negative, went home on lasix -Readmitted 10/17 with states he has been having dyspnea +orthopnea.  - IN ED, CT chest  Noted Small to moderate bilateral pleural effusions, increased compared with CT 02/03/2018. Consolidations within the left greater than right lower lobe, and lingula which may be secondary to atelectasis or pneumonia. -admitted, diuresed and started Abx - underwent Thoracentesis -fluid exudative with 47% eosinophils -Pulm consulted, Cytology negative -improving with IV lasix and Abx, Pulm was considering high Prednisone at discharge for Eosinophilic PNA but now plans home after completing Abx on PO lasix, with quick FU 10/29 at [facility]  Assessment & Plan:  Acute hypoxemic respiratory failure ([place]) -Due to bilateral pleural effusions, diastolic CHF and underlying left lung pneumonia -recent admission-underwent left thoracentesis on 10/12, fluid analysis was exudative, but cultures were negative, procalcitonin level was less than 0.1 then -Prior to recent admission, had prolonged hospitalization with MRSA pneumonia, complicated by respiratory failure and intubation, August through September -now started broad spectrum Abx, IV Lasix, s/p ultrasound-guided  thoracentesis on 10/18 with 1L drained, fluid is exudative by Lights criteria and has 47% eosinophils, Cytology negative -stopped IV Vancomycin, IV Cefepime, augmentin to complete 7days of Abx -blood cultures and pleural fluid cultures are negative -appreciate Pulm input, Cytology negative , may have Eosinophilic PNA-pulm was planning to consider Steroids for Eosinophilic PNA but now plans home on PO lasix, with quick FU 10/29 at [facility], after completing Abx course -clinically much better today, off O2 now -DC home with HHPT and RN  Acute on chronic diastolic CHF -Echo in August with EF of 50% -Diuresed With Iv lasix, I/O inaccurate, negative >3L -transition to PO Lasix -discharge home on atleast 40mg  BID  Mild hyponatremia  -improved with diuresis  Chronic kidney disease stage III -Stable, creatinine at baseline of 1.38  Paroxysmal atrial flutter -CHA2DS2VASc 7.  -Continue propranolol for rate control -Resumed Eliquis  History of PE -continue Eliquis    Hypothyroidism - Continue home synthroid.   HTN -Continue propranolol  Gout -Continue home allopurinol  Chronic pain -Continue home Flexeril, tramadol  Depression -Continue home Lexapro, nortriptyline, trazodone  Physical deconditioning -PT eval  ? COPD -seen on chart review, no H/o Smoking or PFTs  DVT prophylaxis: SCDs/Eliquis Code Status: full code. Family Communication: No family present at bedside. Disposition Plan: discharge  home  Consultants:   Pulm   Procedures: THOracentesis 10/18: 1L drained  Antimicrobials:  Antibiotics Given (last 72 hours)    Date/Time Action Medication Dose Rate   04/18/18 0902 New Bag/Given   ceFEPIme (MAXIPIME) 2 g in sodium chloride 0.9 % 100 mL IVPB 2 g 200 mL/hr   04/19/18 0005 New Bag/Given   ceFEPIme (MAXIPIME) 2 g in sodium chloride 0.9 % 100 mL IVPB 2 g 200 mL/hr   04/19/18  0930 New Bag/Given   ceFEPIme (MAXIPIME) 2 g in sodium chloride 0.9 % 100 mL  IVPB 2 g 200 mL/hr   04/19/18 2116 New Bag/Given   ceFEPIme (MAXIPIME) 2 g in sodium chloride 0.9 % 100 mL IVPB 2 g 200 mL/hr   04/20/18 1002 Given   amoxicillin-clavulanate (AUGMENTIN) 875-125 MG per tablet 1 tablet 1 tablet       Subjective: -breathing much better  Objective: Vitals:   04/19/18 2223 04/20/18 0230 04/20/18 0756 04/20/18 0828  BP: (!) 151/83   116/65  Pulse: 89   82  Resp: 18 (!) 24    Temp: (!) 97.4 F (36.3 C)     TempSrc: Oral     SpO2: 100% 94% 96% 99%  Weight:  119.7 kg    Height:        Intake/Output Summary (Last 24 hours) at 04/21/2018 0838 Last data filed at 04/20/2018 0900 Gross per 24 hour  Intake 480 ml  Output -  Net 480 ml   Filed Weights   04/18/18 0528 04/19/18 0450 04/20/18 0230  Weight: 121.8 kg 122 kg 119.7 kg    Examination:  Gen: Awake, Alert, Oriented X 3, obese male, no distress HEENT: PERRLA, Neck supple, no JVD Lungs: improved air movement bilaterally, Clear bilaterally CVS: S1S2/RRR Abd: soft, Non tender, non distended, BS present Extremities: trace edema Skin: no new rashes Psychiatry: Judgement and insight appear normal. Mood & affect appropriate.     Data Reviewed:   CBC: Recent Labs  Lab 04/16/18 0314 04/17/18 0730 04/18/18 0257 04/19/18 0211 04/20/18 0247  WBC 13.4* 10.6* 11.9* 11.5* 12.1*  NEUTROABS 9.8*  --   --   --   --   HGB 13.8 12.9* 13.1 12.9* 13.9  HCT 41.9 39.9 41.1 40.6 43.2  MCV 89.0 89.1 88.8 89.4 89.6  PLT 404* 462* 544* 536* 092*   Basic Metabolic Panel: Recent Labs  Lab 04/16/18 0314 04/17/18 0730 04/18/18 0257 04/19/18 0211 04/20/18 0247  NA 133* 136 135 135 136  K 3.6 3.6 3.6 4.2 5.2*  CL 96* 99 97* 97* 101  CO2 28 30 28 30 26   GLUCOSE 139* 86 88 117* 96  BUN 20 15 14 13 14   CREATININE 1.22 1.10 1.15 1.16 1.16  CALCIUM 8.4* 8.4* 8.5* 8.9 9.0   GFR: Estimated Creatinine Clearance: 72.5 mL/min (by C-G formula based on SCr of 1.16 mg/dL). Liver Function Tests: Recent  Labs  Lab 04/17/18 0730  PROT 6.1*   No results for input(s): LIPASE, AMYLASE in the last 168 hours. No results for input(s): AMMONIA in the last 168 hours. Coagulation Profile: No results for input(s): INR, PROTIME in the last 168 hours. Cardiac Enzymes: No results for input(s): CKTOTAL, CKMB, CKMBINDEX, TROPONINI in the last 168 hours. BNP (last 3 results) No results for input(s): PROBNP in the last 8760 hours. HbA1C: No results for input(s): HGBA1C in the last 72 hours. CBG: No results for input(s): GLUCAP in the last 168 hours. Lipid Profile: No results for input(s): CHOL, HDL, LDLCALC, TRIG, CHOLHDL, LDLDIRECT in the last 72 hours. Thyroid Function Tests: No results for input(s): TSH, T4TOTAL, FREET4, T3FREE, THYROIDAB in the last 72 hours. Anemia Panel: No results for input(s): VITAMINB12, FOLATE, FERRITIN, TIBC, IRON, RETICCTPCT in the last 72 hours. Urine analysis:    Component Value Date/Time   COLORURINE YELLOW 04/08/2018 2126   APPEARANCEUR CLEAR 04/08/2018 2126   LABSPEC 1.015 04/08/2018 2126   PHURINE 5.0 04/08/2018 2126   GLUCOSEU NEGATIVE  04/08/2018 2126   HGBUR NEGATIVE 04/08/2018 2126   HGBUR 2+ 09/22/2007 1444   BILIRUBINUR NEGATIVE 04/08/2018 2126   BILIRUBINUR n 11/07/2015 1417   KETONESUR NEGATIVE 04/08/2018 2126   PROTEINUR NEGATIVE 04/08/2018 2126   UROBILINOGEN 1.0 11/07/2015 1417   UROBILINOGEN 1.0 05/22/2011 1454   NITRITE NEGATIVE 04/08/2018 2126   LEUKOCYTESUR NEGATIVE 04/08/2018 2126   Sepsis Labs: @LABRCNTIP (procalcitonin:4,lacticidven:4)  ) Recent Results (from the past 240 hour(s))  Blood culture (routine x 2)     Status: None   Collection Time: 04/14/18  8:51 PM  Result Value Ref Range Status   Specimen Description BLOOD RIGHT ANTECUBITAL  Final   Special Requests   Final    BOTTLES DRAWN AEROBIC AND ANAEROBIC Blood Culture results may not be optimal due to an inadequate volume of blood received in culture bottles   Culture   Final     NO GROWTH 5 DAYS Performed at [facility], [place] [address], [place], [place] [address]    Report Status 04/19/2018 FINAL  Final  Blood culture (routine x 2)     Status: None   Collection Time: 04/14/18  8:52 PM  Result Value Ref Range Status   Specimen Description BLOOD LEFT WRIST  Final   Special Requests   Final    BOTTLES DRAWN AEROBIC AND ANAEROBIC Blood Culture results may not be optimal due to an inadequate volume of blood received in culture bottles   Culture   Final    NO GROWTH 5 DAYS Performed at [facility], [place] [address] [place], [place] [address]    Report Status 04/19/2018 FINAL  Final  Culture, body fluid-bottle     Status: None   Collection Time: 04/15/18  2:35 PM  Result Value Ref Range Status   Specimen Description PLEURAL  Final   Special Requests NONE  Final   Culture   Final    NO GROWTH 5 DAYS Performed at [facility] [address] [place], [place] [address]    Report Status 04/20/2018 FINAL  Final  Gram stain     Status: None   Collection Time: 04/15/18  2:35 PM  Result Value Ref Range Status   Specimen Description PLEURAL  Final   Special Requests NONE  Final   Gram Stain   Final    FEW FEW WBC PRESENT,BOTH PMN AND MONONUCLEAR NO ORGANISMS SEEN NO ORGANISMS SEEN Performed at [facility], [address], [place], [place] [address]    Report Status 04/15/2018 FINAL  Final         Radiology Studies: No results found.      Scheduled Meds:  Continuous Infusions:    LOS: 6 days    Time spent: 25min    [place] [place] [facility] Page via [email or web address], password [email or web address] After 7PM please contact night-coverage  04/21/2018, 8:38 AM

## 2018-04-21 NOTE — Telephone Encounter (Signed)
Called and spoke with patient. He states that he is doing well and will be at the appt tomorrow. States he does not need anything or have any questions in the meantime and will call the office if anything changes.

## 2018-04-22 ENCOUNTER — Encounter: Payer: Self-pay | Admitting: Family Medicine

## 2018-04-22 ENCOUNTER — Ambulatory Visit (INDEPENDENT_AMBULATORY_CARE_PROVIDER_SITE_OTHER): Payer: Medicare Other | Admitting: Family Medicine

## 2018-04-22 VITALS — BP 124/78 | HR 102 | Temp 97.8°F | Ht 71.0 in | Wt 266.2 lb

## 2018-04-22 DIAGNOSIS — Z79891 Long term (current) use of opiate analgesic: Secondary | ICD-10-CM | POA: Diagnosis not present

## 2018-04-22 DIAGNOSIS — J189 Pneumonia, unspecified organism: Secondary | ICD-10-CM

## 2018-04-22 DIAGNOSIS — I5032 Chronic diastolic (congestive) heart failure: Secondary | ICD-10-CM | POA: Diagnosis not present

## 2018-04-22 DIAGNOSIS — Z7901 Long term (current) use of anticoagulants: Secondary | ICD-10-CM | POA: Diagnosis not present

## 2018-04-22 DIAGNOSIS — I4891 Unspecified atrial fibrillation: Secondary | ICD-10-CM | POA: Diagnosis not present

## 2018-04-22 DIAGNOSIS — G4733 Obstructive sleep apnea (adult) (pediatric): Secondary | ICD-10-CM | POA: Diagnosis not present

## 2018-04-22 DIAGNOSIS — Z85528 Personal history of other malignant neoplasm of kidney: Secondary | ICD-10-CM | POA: Diagnosis not present

## 2018-04-22 DIAGNOSIS — Z8619 Personal history of other infectious and parasitic diseases: Secondary | ICD-10-CM | POA: Diagnosis not present

## 2018-04-22 DIAGNOSIS — Z87891 Personal history of nicotine dependence: Secondary | ICD-10-CM | POA: Diagnosis not present

## 2018-04-22 DIAGNOSIS — N183 Chronic kidney disease, stage 3 (moderate): Secondary | ICD-10-CM | POA: Diagnosis not present

## 2018-04-22 DIAGNOSIS — J90 Pleural effusion, not elsewhere classified: Secondary | ICD-10-CM

## 2018-04-22 DIAGNOSIS — I129 Hypertensive chronic kidney disease with stage 1 through stage 4 chronic kidney disease, or unspecified chronic kidney disease: Secondary | ICD-10-CM | POA: Diagnosis not present

## 2018-04-22 DIAGNOSIS — J449 Chronic obstructive pulmonary disease, unspecified: Secondary | ICD-10-CM | POA: Diagnosis not present

## 2018-04-22 DIAGNOSIS — Z905 Acquired absence of kidney: Secondary | ICD-10-CM | POA: Diagnosis not present

## 2018-04-22 DIAGNOSIS — Z86711 Personal history of pulmonary embolism: Secondary | ICD-10-CM | POA: Diagnosis not present

## 2018-04-22 DIAGNOSIS — M109 Gout, unspecified: Secondary | ICD-10-CM | POA: Diagnosis not present

## 2018-04-22 DIAGNOSIS — Z22322 Carrier or suspected carrier of Methicillin resistant Staphylococcus aureus: Secondary | ICD-10-CM | POA: Diagnosis not present

## 2018-04-22 DIAGNOSIS — M1990 Unspecified osteoarthritis, unspecified site: Secondary | ICD-10-CM | POA: Diagnosis not present

## 2018-04-22 DIAGNOSIS — Z96653 Presence of artificial knee joint, bilateral: Secondary | ICD-10-CM | POA: Diagnosis not present

## 2018-04-22 DIAGNOSIS — I2510 Atherosclerotic heart disease of native coronary artery without angina pectoris: Secondary | ICD-10-CM | POA: Diagnosis not present

## 2018-04-22 NOTE — Patient Instructions (Addendum)
Schedule a flu shot with the nurses here in 7-10 days. I want you to come back and get this if you continue to get better and the lung doctors think its reasonabl  Thrilled you are improving!   I am happy to sign for portable oxygen if advanced home care states you qualify- I would think you would since you are needing it when you exert yourself

## 2018-04-22 NOTE — Progress Notes (Signed)
Subjective:  Wayne Booth is a 77 y.o. year old very pleasant male patient who presents for/with See problem oriented charting ROS-shortness of breath improving, no chest pain, improved edema, has noted weight loss with diuresis  Past Medical History-  Patient Active Problem List   Diagnosis Date Noted  . CAD (coronary artery disease) 04/09/2018    Priority: High  . HAP (hospital-acquired pneumonia) 04/08/2018    Priority: High  . (HFpEF) heart failure with preserved ejection fraction ([place]) 12/16/2017    Priority: High  . Chest pain     Priority: High  . Atrial flutter ([place]) 11/01/2017    Priority: High  . Chronic pulmonary embolism ([place]) 10/23/2016    Priority: High  . Asthmatic bronchitis 06/09/2011    Priority: High  . 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 History of renal cell carcinoma 12/26/2007    Priority: High  . CKD (chronic kidney disease) stage 3, GFR 30-59 ml/min (HCC)     Priority: Medium  . HCAP (healthcare-associated pneumonia) 02/03/2018    Priority: Medium  . History of adenomatous polyp of colon 04/12/2017    Priority: Medium  . Cervical disc disease 11/13/2015    Priority: Medium  . Essential tremor 08/14/2015    Priority: Medium  . Gout 08/14/2015    Priority: Medium  . Hypothyroidism 07/12/2014    Priority: Medium  . Hyperglycemia 07/12/2014    Priority: Medium  . GAD (generalized anxiety disorder) 11/07/2013    Priority: Medium  . OSA (obstructive sleep apnea) 07/07/2011    Priority: Medium  . Morbid obesity ([place]) 10/02/2008    Priority: Medium  . Essential hypertension 12/28/2006    Priority: Medium  . Constipation 01/21/2018    Priority: Low  . Anticoagulated     Priority: Low  . Bilateral foot pain 07/13/2017    Priority: Low  . Community acquired pneumonia 06/18/2017    Priority: Low  . Actinic keratosis 11/27/2009    Priority: Low  . Osteoarthritis 11/13/2008    Priority: Low  . TESTOSTERONE DEFICIENCY  12/28/2006    Priority: Low  . First degree AV block 04/15/2018  . Thrombocytosis ([place]) 04/15/2018  . Hyponatremia 04/15/2018  . History of pulmonary embolus (PE) 04/15/2018  . Physical deconditioning 04/15/2018  . Pleural effusion 04/14/2018  . Acute hypoxemic respiratory failure ([place]) 04/09/2018  . Depression 04/09/2018  . Chronic pain 04/09/2018  . Labile blood pressure   . Steroid-induced hyperglycemia   . Debility 02/26/2018  . Hypoxemia 11/08/2012  . Insomnia 12/26/2007    Medications- reviewed and updated Current Outpatient Medications  Medication Sig Dispense Refill  . albuterol (PROVENTIL HFA;VENTOLIN HFA) 108 (90 Base) MCG/ACT inhaler Inhale 2 puffs into the lungs every 6 (six) hours as needed for wheezing or shortness of breath. 1 Inhaler 3  . allopurinol (ZYLOPRIM) 100 MG tablet Take 1 tablet (100 mg total) by mouth daily. 30 tablet 6  . apixaban (ELIQUIS) 5 MG TABS tablet Take 1 tablet (5 mg total) by mouth 2 (two) times daily. 60 tablet 11  . colchicine 0.6 MG tablet Take 1 tablet (0.6 mg total) by mouth 2 (two) times daily. 60 tablet 0  . cyclobenzaprine (FLEXERIL) 10 MG tablet Take 1 tablet (10 mg total) by mouth 3 (three) times daily as needed for muscle spasms. 30 tablet 0  . docusate sodium (COLACE) 100 MG capsule Take 1 capsule (100 mg total) by mouth daily. 10 capsule 0  . escitalopram (LEXAPRO) 20 MG tablet  Take 1 tablet (20 mg total) by mouth daily. 90 tablet 3  . furosemide (LASIX) 40 MG tablet Take 1 tablet (40 mg total) by mouth 2 (two) times daily. 30 tablet 0  . guaiFENesin-dextromethorphan (ROBITUSSIN DM) 100-10 MG/5ML syrup Take 5 mLs by mouth every 6 (six) hours as needed for cough. 118 mL 0  . levothyroxine (SYNTHROID, LEVOTHROID) 150 MCG tablet Take 1 tablet (150 mcg total) by mouth daily. 90 tablet 2  . Multiple Vitamins-Minerals (MULTIVITAMINS THER. W/MINERALS) TABS Take 1 tablet by mouth daily.    . nortriptyline (PAMELOR) 75 MG capsule Take 1  capsule (75 mg total) by mouth at bedtime. 90 capsule 1  . polyethylene glycol (MIRALAX / GLYCOLAX) packet Take 17 g by mouth daily. 14 each 0  . potassium chloride SA (K-DUR,KLOR-CON) 20 MEQ tablet Take 1 tablet (20 mEq total) by mouth 2 (two) times daily. 60 tablet 0  . propranolol (INDERAL) 10 MG tablet TAKE 1 TABLET (10 MG TOTAL) BY MOUTH 2 (TWO) TIMES DAILY. (Patient taking differently: Take 10 mg by mouth 2 (two) times daily. ) 60 tablet 3  . traMADol (ULTRAM) 50 MG tablet Take 1 tablet (50 mg total) by mouth every 6 (six) hours as needed for moderate pain. 30 tablet 2  . traZODone (DESYREL) 50 MG tablet TAKE ONE HALF TO ONE TABLET BY MOUTH EVERY NIGHT AT BEDTIME AS NEEDED FOR SLEEP (Patient taking differently: Take 25-50 mg by mouth at bedtime as needed for sleep. ) 90 tablet 1   No current facility-administered medications for this visit.     Objective: BP 124/78 (BP Location: Left Arm, Patient Position: Sitting, Cuff Size: Large)   Pulse (!) 102   Temp 97.8 F (36.6 C) (Oral)   Ht 5\' 11"  (1.803 m)   Wt 266 lb 3.2 oz (120.7 kg)   SpO2 92%   BMI 37.13 kg/m  Gen: NAD, resting comfortably CV: RRR (not tachycardic on my exam- HR in 90s)  no murmurs rubs or gallops Lungs: Slight crackles bilateral bases, otherwise clear lungs Abdomen: soft/nontender/nondistended/normal bowel sounds. No rebound or guarding.  Ext: Trace edema Skin: warm, dry  Assessment/Plan:  (HFpEF) heart failure with preserved ejection fraction (HCC) Also Recurrent pneumonia Also Recurrent pleural effusion S: hospital follow up today for CHF, pleural effusions, left lung pneumonia.   Patient with hospitalization and discharge 04/11/18  after prolonged hospitalization due to pneumonia leading to ICU stay on ventilator.   I saw him in hospital follow up a few days later and he was largely stable but shortly after that symptoms worsened and he had to be re hospitalized- for worsening shortness of breath and  orthopnea and found to have bilateral pleural effusions, worsening fluid overload, and left lung pneumonia. He required another thoracentesis this admission after previously having one during last hospitalization. Both were exudative by Lights criteria and most recent one showed 47% eosinophils- fortunately with negative cytology on both.  Pneumonia was treated with vancomycina nd cefepime initially- was discharged to complete 7 days of augmentin. Blood and pleural fluid cultures negative.   Possibility of eosinophilic pneumonia - but pulmonology wanted to see if patient was continuing to improve with diuresis and antibiotics before putting patient on steroids. They have started a rheumatologic and allergic work up as well per bloodwork.   Fortunately patient states he feels much improved at this time. He wants to follow more closely in the office due to hospitalizations and asks for 2-3 week follow up. Also sees pulmonology  next week  From CHF perspective- patient states compliant with lasix. He was 3 liters negative in hospital- weight down 4 lbs from last office visit with me. He states edema has improved.  A/P: HFpEF Stable- continue current medications.   Pneumonia- adequately treated and patient much improved. Hopeful this points away from eosinophilic pneumonia (eosinophils could be from recurrent thoracentesis). Thankfully close follow up with pulmonology next week- they will reassess pleural effusions with x-ray as well  Pulse ox 92% today but running about 98% at home. He is interested in getting portable oxygen and I told him would be willing to sign for this. About to start PT- hoping that will further aid in his improvement.   Allergen panel still pending- will be interested in pulmonology view on this   I encouraged him to practice taking slow deep breaths and breathing out through pursed lips- may be worth getting him incentive spirometer at home- suspect atelectasis at lung bases  still  Future Appointments  Date Time Provider [place]  04/26/2018 10:00 AM Parrett, Fonnie Mu, NP [facility] None  05/03/2018  9:00 AM [facility] NURSE [facility] PEC  05/03/2018 11:15 AM Constance Haw, MD [facility]  05/13/2018  3:30 PM Marin Olp, MD [facility] PEC  05/17/2018  3:00 PM Jettie Booze, MD [facility]  05/18/2018 10:20 AM Jamse Arn, MD [facility] CPR   Return precautions advised.  [name], MD

## 2018-04-23 LAB — ALLERGENS W/TOTAL IGE AREA 2
Alternaria Alternata IgE: <0.1 kU/L
Aspergillus Fumigatus IgE: 0.23 kU/L — AB
Cat Dander IgE: <0.1 kU/L
Cladosporium Herbarum IgE: 0.14 kU/L — AB
Common Silver Birch IgE: <0.1 kU/L
D Farinae IgE: <0.1 kU/L
Elm, American IgE: <0.1 kU/L
IGE (IMMUNOGLOBULIN E), SERUM: 367 [IU]/mL (ref 6–495)
Johnson Grass IgE: <0.1 kU/L
Maple/Box Elder IgE: <0.1 kU/L
Mouse Urine IgE: <0.1 kU/L
Oak, White IgE: <0.1 kU/L
Pecan, Hickory IgE: <0.1 kU/L
Penicillium Chrysogen IgE: 0.24 kU/L — AB
Sheep Sorrel IgE Qn: <0.1 kU/L
Timothy Grass IgE: <0.1 kU/L
White Mulberry IgE: <0.1 kU/L

## 2018-04-23 NOTE — Assessment & Plan Note (Addendum)
Also Recurrent pneumonia Also Recurrent pleural effusion S: hospital follow up today for CHF, pleural effusions, left lung pneumonia.   Patient with hospitalization and discharge 04/11/18  after prolonged hospitalization due to pneumonia leading to ICU stay on ventilator.   I saw him in hospital follow up a few days later and he was largely stable but shortly after that symptoms worsened and he had to be re hospitalized- for worsening shortness of breath and orthopnea and found to have bilateral pleural effusions, worsening fluid overload, and left lung pneumonia. He required another thoracentesis this admission after previously having one during last hospitalization. Both were exudative by Lights criteria and most recent one showed 47% eosinophils- fortunately with negative cytology on both.  Pneumonia was treated with vancomycina nd cefepime initially- was discharged to complete 7 days of augmentin. Blood and pleural fluid cultures negative.   Possibility of eosinophilic pneumonia - but pulmonology wanted to see if patient was continuing to improve with diuresis and antibiotics before putting patient on steroids. They have started a rheumatologic and allergic work up as well per bloodwork.   Fortunately patient states he feels much improved at this time. He wants to follow more closely in the office due to hospitalizations and asks for 2-3 week follow up. Also sees pulmonology next week  From CHF perspective- patient states compliant with lasix. He was 3 liters negative in hospital- weight down 4 lbs from last office visit with me. He states edema has improved.  A/P: HFpEF Stable- continue current medications.   Pneumonia- adequately treated and patient much improved. Hopeful this points away from eosinophilic pneumonia (eosinophils could be from recurrent thoracentesis). Thankfully close follow up with pulmonology next week- they will reassess pleural effusions with x-ray as well  Pulse ox 92%  today but running about 98% at home. He is interested in getting portable oxygen and I told him would be willing to sign for this. About to start PT- hoping that will further aid in his improvement.   Allergen panel still pending- will be interested in pulmonology view on this

## 2018-04-25 DIAGNOSIS — I4891 Unspecified atrial fibrillation: Secondary | ICD-10-CM | POA: Diagnosis not present

## 2018-04-25 DIAGNOSIS — G4733 Obstructive sleep apnea (adult) (pediatric): Secondary | ICD-10-CM | POA: Diagnosis not present

## 2018-04-25 DIAGNOSIS — Z86711 Personal history of pulmonary embolism: Secondary | ICD-10-CM | POA: Diagnosis not present

## 2018-04-25 DIAGNOSIS — I129 Hypertensive chronic kidney disease with stage 1 through stage 4 chronic kidney disease, or unspecified chronic kidney disease: Secondary | ICD-10-CM | POA: Diagnosis not present

## 2018-04-25 DIAGNOSIS — Z8619 Personal history of other infectious and parasitic diseases: Secondary | ICD-10-CM | POA: Diagnosis not present

## 2018-04-25 DIAGNOSIS — J449 Chronic obstructive pulmonary disease, unspecified: Secondary | ICD-10-CM | POA: Diagnosis not present

## 2018-04-25 DIAGNOSIS — M109 Gout, unspecified: Secondary | ICD-10-CM | POA: Diagnosis not present

## 2018-04-25 DIAGNOSIS — Z87891 Personal history of nicotine dependence: Secondary | ICD-10-CM | POA: Diagnosis not present

## 2018-04-25 DIAGNOSIS — M1990 Unspecified osteoarthritis, unspecified site: Secondary | ICD-10-CM | POA: Diagnosis not present

## 2018-04-25 DIAGNOSIS — Z905 Acquired absence of kidney: Secondary | ICD-10-CM | POA: Diagnosis not present

## 2018-04-25 DIAGNOSIS — Z96653 Presence of artificial knee joint, bilateral: Secondary | ICD-10-CM | POA: Diagnosis not present

## 2018-04-25 DIAGNOSIS — Z7901 Long term (current) use of anticoagulants: Secondary | ICD-10-CM | POA: Diagnosis not present

## 2018-04-25 DIAGNOSIS — Z22322 Carrier or suspected carrier of Methicillin resistant Staphylococcus aureus: Secondary | ICD-10-CM | POA: Diagnosis not present

## 2018-04-25 DIAGNOSIS — I2510 Atherosclerotic heart disease of native coronary artery without angina pectoris: Secondary | ICD-10-CM | POA: Diagnosis not present

## 2018-04-25 DIAGNOSIS — N183 Chronic kidney disease, stage 3 (moderate): Secondary | ICD-10-CM | POA: Diagnosis not present

## 2018-04-25 DIAGNOSIS — Z85528 Personal history of other malignant neoplasm of kidney: Secondary | ICD-10-CM | POA: Diagnosis not present

## 2018-04-25 DIAGNOSIS — Z79891 Long term (current) use of opiate analgesic: Secondary | ICD-10-CM | POA: Diagnosis not present

## 2018-04-26 ENCOUNTER — Ambulatory Visit (INDEPENDENT_AMBULATORY_CARE_PROVIDER_SITE_OTHER)
Admission: RE | Admit: 2018-04-26 | Discharge: 2018-04-26 | Disposition: A | Discharge status: Home / Self Care | Payer: Medicare Other | Source: Ambulatory Visit | Attending: Adult Health | Admitting: Adult Health

## 2018-04-26 ENCOUNTER — Telehealth: Payer: Self-pay | Admitting: Family Medicine

## 2018-04-26 ENCOUNTER — Ambulatory Visit: Payer: Medicare Other | Admitting: Adult Health

## 2018-04-26 ENCOUNTER — Encounter: Payer: Self-pay | Admitting: Adult Health

## 2018-04-26 VITALS — BP 130/80 | HR 88 | Ht 71.0 in | Wt 264.0 lb

## 2018-04-26 DIAGNOSIS — J189 Pneumonia, unspecified organism: Secondary | ICD-10-CM | POA: Diagnosis not present

## 2018-04-26 DIAGNOSIS — Z23 Encounter for immunization: Secondary | ICD-10-CM | POA: Diagnosis not present

## 2018-04-26 DIAGNOSIS — R5381 Other malaise: Secondary | ICD-10-CM | POA: Diagnosis not present

## 2018-04-26 DIAGNOSIS — G4733 Obstructive sleep apnea (adult) (pediatric): Secondary | ICD-10-CM

## 2018-04-26 DIAGNOSIS — J90 Pleural effusion, not elsewhere classified: Secondary | ICD-10-CM

## 2018-04-26 DIAGNOSIS — I5032 Chronic diastolic (congestive) heart failure: Secondary | ICD-10-CM

## 2018-04-26 DIAGNOSIS — J9611 Chronic respiratory failure with hypoxia: Secondary | ICD-10-CM | POA: Insufficient documentation

## 2018-04-26 NOTE — Assessment & Plan Note (Addendum)
D CHF - appears compensated at present  Keep follow up with Cardiology

## 2018-04-26 NOTE — Telephone Encounter (Signed)
Copied from [place] [id number]. Topic: Quick Communication - Home Health Verbal Orders >> Apr 26, 2018  8:59 AM Margot Ables wrote: Caller/Agency: Wells Guiles RN with [facility] Number: [phone], secure VM if not able to answer Requesting OT/PT/Skilled Nursing/Social Work: SN Frequency: recert orders for 1 week 1 and 2 week 2

## 2018-04-26 NOTE — Addendum Note (Signed)
Addended by: Parke Poisson E on: 04/26/2018 05:04 PM   Modules accepted: Orders

## 2018-04-26 NOTE — Telephone Encounter (Signed)
Yes thanks may restart

## 2018-04-26 NOTE — Telephone Encounter (Signed)
Jeannine calling from [facility] called and stated that she would like to resume PT. Patient has been in hospital  Frequency 2x2 [phone]

## 2018-04-26 NOTE — Telephone Encounter (Signed)
Verbal orders given for below.  

## 2018-04-26 NOTE — Assessment & Plan Note (Signed)
Recurrent effusions , now improving .

## 2018-04-26 NOTE — Assessment & Plan Note (Signed)
Complete home PT.

## 2018-04-26 NOTE — Telephone Encounter (Signed)
See note

## 2018-04-26 NOTE — Assessment & Plan Note (Signed)
Cont on O2 with act and At bedtime   

## 2018-04-26 NOTE — Telephone Encounter (Signed)
Ok to restart

## 2018-04-26 NOTE — Patient Instructions (Addendum)
Flu shot today .  Continue on Oxygen 2l/m with activity and At bedtime  . POC order .   Follow up with Cardiology as planned  Continue on Lasix  Low salt diet .  Legs elevated .  Complete PT at home .  Follow up with Dr. Elsworth Soho  Or Parrett NP in 3 weeks and As needed   Please contact office for sooner follow up if symptoms do not improve or worsen or seek emergency care

## 2018-04-26 NOTE — Assessment & Plan Note (Signed)
MRSA PNA 01/2018 with prolonged hospitalization and critical illness.  - he making good forward progress.  Had 2 additional admission after this with Decompensated CHF with transudative effusion and then recurrent PNA and exudative effusion .  He has improved with antibiotics and diuresis most recently. He did have some peripheral eosinophilia and elevated eosinophils in pleural analysis . He did not require steroids . He is improved clinically and cxr is also improving . Will repeat CBC w/ diff in 4 weeks to look at eosinophil count .

## 2018-04-26 NOTE — Assessment & Plan Note (Signed)
Pt has very severe OSA with comorbidities of A. fib and congestive heart failure.  He would greatly benefit from nocturnal CPAP.  He absolutely declines this at this time.  For now he can continue on oxygen at bedtime.  Once he is clinically improved from his critical illness.  Could consider looking to see if he might be a candidate for the INSPIRE procedure.

## 2018-04-26 NOTE — Assessment & Plan Note (Signed)
Wt loss  

## 2018-04-26 NOTE — Progress Notes (Signed)
@Patient  ID: Wayne Booth, male    DOB: 1941/05/06, 77 y.o.   MRN: [id number]  Chief Complaint  Patient presents with  . Follow-up    PNA     Referring provider: Marin Olp, MD  HPI: 77 year old male former smoker followed for recurrent pneumonia and asthmatic bronchitis Patient has severe obstructive sleep apnea with CPAP intolerance and declines further evaluation. PE April 2018 on anticoagulation  Medical history significant for previous pituitary tumor removal, A. Fib, renal cell carcinoma status post nephrectomy  Significant tests/ events reviewed  CT chest June 08, 2017 showed a small left lower lobe area of consolidation.   PFT 07/2017 moderate restriction, DLCO 67%  CT chest October 2019 small to moderate bilateral pleural effusions consolidations within the left greater than right lower lobe and lingula.  Hazy density in the left greater than right lower lobe.  2D echo February 04, 2018 showed EF 50 to 55%  . 04/26/2018 Follow up : Pneumonia, post hospital follow-up Patient presents for a post hospital follow-up.  Patient has had multiple hospitalizations over the last 6 months for A. fib, pneumonia and pleural effusion. Most recently admitted for recurrent  pneumonia, decompensated congestive heart failure and bilateral effusions.  Patient did undergo thoracentesis fluid analysis showed exudative process.  He was treated with Antibiotics and diuresis .   Patient has had a very complicated medical course.  Prior admission in August  he had a prolonged hospitalization with MRSA pneumonia complicated by respiratory failure, Sepsis  requiring intubation and ventilatory support.  He was treated with aggressive IV antibiotics,  Diuresis . He required prolonged rehab.  He was readmitted earlier this month twice. First admission with decompensated D CHF ,  Effusion , thoracentesis showed transudative process. Improved with diuresis .  He was readmitted with  Pnuemonia and parapneumonic effusion.  Thoracentesis on October 18 with 1 L of pleural fluid removed.  Fluid analysis showed exudative process ,  47% eosinophils.  There was concern that he may have an eosinophilic pneumonia.  Cytology was negative.  Blood cultures and pleural cultures were negative. Chest x-ray prior to discharge showed bilateral pleural effusions and basilar atelectasis. Autoimmune workup was neg .(ANCA, RA factor , CCP , ANA ) . IgE 367 , mild positive Aspergillus  On allergy profile. He was treated with antibiotics. Discharged on Augmentin. Has completed full course .  Since discharge patient is feeling better, cough and congestion are decreased . Still remains weak. Working on getting muscles stronger. Starting home PT soon .  Chest xray today shows improved aeration with decreased effusion and BB atx/infiltrates.  On oxygen at discharge 2l/m with activity and At bedtime  . Not using on regular basis At bedtime  . We discussed oxygen compliance . Educated on potential complications of hypoxia      Allergies  Allergen Reactions  . Ambien [Zolpidem] Anxiety    Anxiety and agitation when tried as sleeper in hospital aug 2019    Immunization History  Administered Date(s) Administered  . Influenza Split 04/01/2011, 03/29/2012  . Influenza Whole 04/12/2007, 03/18/2009, 03/04/2010  . Influenza, High Dose Seasonal PF 04/03/2014, 04/04/2015, 04/24/2016, 04/12/2017, 04/26/2018  . Influenza,inj,Quad PF,6+ Mos 03/22/2013  . Pneumococcal Conjugate-13 11/12/2014  . Pneumococcal Polysaccharide-23 06/09/2011  . Td 06/29/1994  . Tdap 06/09/2011  . Zoster 09/22/2007    Past Medical History:  Diagnosis Date  . Allergy    States his nose runs when he is around grease.   Marland Kitchen Anxiety   .  Arthritis   . Atrial fibrillation ([place])   . Cancer ([place])   . Coronary artery disease   . History of total knee replacement   . Hypertension   . Hypothyroidism   . Kidney cancer, primary, with  metastasis from kidney to other site [facility])   . Kidney stone   . OSA (obstructive sleep apnea)    pt does not wear cpap at night  . Pituitary cyst ([place])   . Pneumonia    hx of  . Prostate cancer ([place])     Tobacco History: Social History   Tobacco Use  Smoking Status Former Smoker  . Years: 4.00  . Types: Cigars  Smokeless Tobacco Never Used  Tobacco Comment   smoked cigars 1-2x per year; QUIT 2 YEARS AGO   2017   Counseling given: Not Answered Comment: smoked cigars 1-2x per year; QUIT 2 YEARS AGO   2017   Outpatient Medications Prior to Visit  Medication Sig Dispense Refill  . albuterol (PROVENTIL HFA;VENTOLIN HFA) 108 (90 Base) MCG/ACT inhaler Inhale 2 puffs into the lungs every 6 (six) hours as needed for wheezing or shortness of breath. 1 Inhaler 3  . allopurinol (ZYLOPRIM) 100 MG tablet Take 1 tablet (100 mg total) by mouth daily. 30 tablet 6  . apixaban (ELIQUIS) 5 MG TABS tablet Take 1 tablet (5 mg total) by mouth 2 (two) times daily. 60 tablet 11  . colchicine 0.6 MG tablet Take 1 tablet (0.6 mg total) by mouth 2 (two) times daily. 60 tablet 0  . cyclobenzaprine (FLEXERIL) 10 MG tablet Take 1 tablet (10 mg total) by mouth 3 (three) times daily as needed for muscle spasms. 30 tablet 0  . docusate sodium (COLACE) 100 MG capsule Take 1 capsule (100 mg total) by mouth daily. 10 capsule 0  . escitalopram (LEXAPRO) 20 MG tablet Take 1 tablet (20 mg total) by mouth daily. 90 tablet 3  . furosemide (LASIX) 40 MG tablet Take 1 tablet (40 mg total) by mouth 2 (two) times daily. 30 tablet 0  . guaiFENesin-dextromethorphan (ROBITUSSIN DM) 100-10 MG/5ML syrup Take 5 mLs by mouth every 6 (six) hours as needed for cough. 118 mL 0  . levothyroxine (SYNTHROID, LEVOTHROID) 150 MCG tablet Take 1 tablet (150 mcg total) by mouth daily. 90 tablet 2  . Multiple Vitamins-Minerals (MULTIVITAMINS THER. W/MINERALS) TABS Take 1 tablet by mouth daily.    . nortriptyline (PAMELOR) 75 MG capsule Take 1  capsule (75 mg total) by mouth at bedtime. 90 capsule 1  . polyethylene glycol (MIRALAX / GLYCOLAX) packet Take 17 g by mouth daily. 14 each 0  . potassium chloride SA (K-DUR,KLOR-CON) 20 MEQ tablet Take 1 tablet (20 mEq total) by mouth 2 (two) times daily. 60 tablet 0  . propranolol (INDERAL) 10 MG tablet TAKE 1 TABLET (10 MG TOTAL) BY MOUTH 2 (TWO) TIMES DAILY. (Patient taking differently: Take 10 mg by mouth 2 (two) times daily. ) 60 tablet 3  . traMADol (ULTRAM) 50 MG tablet Take 1 tablet (50 mg total) by mouth every 6 (six) hours as needed for moderate pain. 30 tablet 2  . traZODone (DESYREL) 50 MG tablet TAKE ONE HALF TO ONE TABLET BY MOUTH EVERY NIGHT AT BEDTIME AS NEEDED FOR SLEEP (Patient taking differently: Take 25-50 mg by mouth at bedtime as needed for sleep. ) 90 tablet 1   No facility-administered medications prior to visit.      Review of Systems  Constitutional:   No  weight loss,  night sweats,  Fevers, chills,  +fatigue, or  lassitude.  HEENT:   No headaches,  Difficulty swallowing,  Tooth/dental problems, or  Sore throat,                No sneezing, itching, ear ache, nasal congestion, post nasal drip,   CV:  No chest pain,  Orthopnea, PND, swelling in lower extremities, anasarca, dizziness, palpitations, syncope.   GI  No heartburn, indigestion, abdominal pain, nausea, vomiting, diarrhea, change in bowel habits, loss of appetite, bloody stools.   Resp:    No chest wall deformity  Skin: no rash or lesions.  GU: no dysuria, change in color of urine, no urgency or frequency.  No flank pain, no hematuria   MS:  No joint pain or swelling.  No decreased range of motion.  No back pain.    Physical Exam  BP 130/80 (BP Location: Left Arm, Cuff Size: Large)   Pulse 88   Ht 5\' 11"  (1.803 m)   Wt 264 lb (119.7 kg)   SpO2 95%   BMI 36.82 kg/m   GEN: A/Ox3; pleasant , NAD, elderly   HEENT:  [place]/AT,  EACs-clear, TMs-wnl, NOSE-clear, THROAT-clear, no lesions, no  postnasal drip or exudate noted.   NECK:  Supple w/ fair ROM; no JVD; normal carotid impulses w/o bruits; no thyromegaly or nodules palpated; no lymphadenopathy.    RESP  Decreased BS in bases  no accessory muscle use, no dullness to percussion  CARD:  RRR, no m/r/g, no peripheral edema, pulses intact, no cyanosis or clubbing.  GI:   Soft & nt; nml bowel sounds; no organomegaly or masses detected.   Musco: Warm bil, no deformities or joint swelling noted.   Neuro: alert, no focal deficits noted.    Skin: Warm, no lesions or rashes    Lab Results:  CBC  BNP  Imaging: Dg Chest 1 View  Result Date: 04/15/2018 CLINICAL DATA:  Followup left-sided thoracentesis. EXAM: CHEST  1 VIEW COMPARISON:  04/14/2018 FINDINGS: Allowing for a poor inspiration and lordotic AP positioning, there appears to be less pleural fluid on the left, though some does remain. No pneumothorax is seen. Basilar atelectasis persists left more than right. IMPRESSION: No pneumothorax seen on the left following thoracentesis. Somewhat less pleural fluid. See above. Electronically Signed   By: Nelson Chimes M.D.   On: 04/15/2018 14:02   Dg Chest 1 View  Result Date: 04/09/2018 CLINICAL DATA:  Status post thoracentesis, 1 L removed. EXAM: CHEST  1 VIEW COMPARISON:  Chest x-ray dated 04/08/2018. FINDINGS: Improved aeration at the LEFT lung base. No pneumothorax seen. RIGHT lung remains clear. Heart size and mediastinal contours are stable. IMPRESSION: Improved aeration at the LEFT lung base status post thoracentesis. No pneumothorax seen. Electronically Signed   By: Franki Cabot M.D.   On: 04/09/2018 14:47   Dg Chest 2 View  Result Date: 04/26/2018 CLINICAL DATA:  Follow-up left pleural effusion. EXAM: CHEST - 2 VIEW COMPARISON:  04/19/2018. FINDINGS: Stable cardiomegaly with normal pulmonary vascularity. Low lung volumes with mild bibasilar. Bibasilar atelectasis/edema and small bilateral pleural effusions, improved  from prior exam. No pneumothorax. IMPRESSION: Stable cardiomegaly. Mild bibasilar and infiltrates/edema. Small bilateral pleural effusions. These findings have improved from prior exam. Findings suggest improving CHF. Electronically Signed   By: Marcello Moores  Register   On: 04/26/2018 10:31   Dg Chest 2 View  Result Date: 04/14/2018 CLINICAL DATA:  Shortness of breath EXAM: CHEST - 2 VIEW COMPARISON:  04/10/2018, 04/09/2018,  04/08/2018 FINDINGS: Small right pleural effusion and small to moderate left pleural effusion, may be slightly increased on the left. Persistent consolidation at the lingula and left base. Enlarged cardiomediastinal silhouette with aortic atherosclerosis. Streaky atelectasis at the right base. No pneumothorax. IMPRESSION: 1. Small right pleural effusion and small moderate left pleural effusion, possible slight increase on the left side 2. No change in dense airspace disease at the lingula and left base. 3. Cardiomegaly Electronically Signed   By: Donavan Foil M.D.   On: 04/14/2018 19:47   Dg Chest 2 View  Result Date: 04/08/2018 CLINICAL DATA:  Shortness of breath EXAM: CHEST - 2 VIEW COMPARISON:  02/22/2018 FINDINGS: Cardiac enlargement. Pulmonary vascularity is normal. Moderate left and small right pleural effusions. Basilar atelectasis or consolidation more prominent on the left. This is progressing since previous study. No pneumothorax. Calcification of the aorta. Degenerative changes in the spine. IMPRESSION: Bilateral pleural effusions with basilar atelectasis or consolidation, greater on the left. Progression since previous study. Electronically Signed   By: Lucienne Capers M.D.   On: 04/08/2018 22:17   Ct Chest Wo Contrast  Result Date: 04/15/2018 CLINICAL DATA:  Shortness of breath EXAM: CT CHEST WITHOUT CONTRAST TECHNIQUE: Multidetector CT imaging of the chest was performed following the standard protocol without IV contrast. COMPARISON:  Chest x-ray 04/14/2018, CT chest  02/03/2018, CT abdomen pelvis 05/22/2011 FINDINGS: Cardiovascular: Limited evaluation without intravenous contrast. Mild aortic atherosclerosis. No aneurysmal dilatation. Mild coronary vascular calcification. Borderline to mild cardiomegaly. Small pericardial effusion measuring up to 8 mm in maximum thickness anteriorly. Mediastinum/Nodes: Midline trachea. No thyroid mass. Borderline lymph nodes measuring up to 1 cm, mildly decreased in size compared to prior chest CT. Esophagus within normal limits. Lungs/Pleura: Small moderate bilateral pleural effusions, increased compared with prior CT. Consolidation within the lingula and left lower lobe as well as the right lower lobe. Scattered foci of hazy density in the upper lobes suggesting edema. 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 No acute abnormality Musculoskeletal: Degenerative changes.  No acute osseous abnormality IMPRESSION: 1. Small to moderate bilateral pleural effusions, increased as compared with CT 02/03/2018. Consolidations within the left greater than right lower lobe, and lingula which may be secondary to atelectasis or pneumonia. 2. Background hazy density in the left greater than right upper lobes, possible edema. Small pericardial effusion. Aortic Atherosclerosis (ICD10-I70.0). Electronically Signed   By: Donavan Foil M.D.   On: 04/15/2018 01:54   Dg Chest Port 1 View  Result Date: 04/19/2018 CLINICAL DATA:  77 year old male with pleural effusions. Subsequent encounter. EXAM: PORTABLE CHEST 1 VIEW COMPARISON:  04/15/2018. FINDINGS: Cardiomegaly. Pulmonary vascular congestion slightly improved. Bilateral pleural effusions and basilar atelectasis once again noted. Limited for evaluating for lung base infiltrate or mass. Calcified aorta. No pneumothorax detected. IMPRESSION: 1. Cardiomegaly. 2. Pulmonary vascular congestion slightly improved. 3. Bilateral pleural effusions and basilar atelectasis once  again noted. 4.  Aortic Atherosclerosis (ICD10-I70.0). Electronically Signed   By: Genia Del M.D.   On: 04/19/2018 10:03   Dg Chest Port 1 View  Result Date: 04/10/2018 CLINICAL DATA:  Difficulty breathing EXAM: PORTABLE CHEST 1 VIEW COMPARISON:  04/09/2018 FINDINGS: Cardiac shadow is enlarged but stable. The lungs are well aerated bilaterally. Elevation of left hemidiaphragm is noted. No pneumothorax is seen. Minimal residual pleural effusion on the left is noted. No bony abnormality is noted. IMPRESSION: Elevation of the left hemidiaphragm. Minimal left pleural effusion is seen. No pneumothorax is noted. Electronically Signed  By: Inez Catalina M.D.   On: 04/10/2018 08:13   Ir Thoracentesis Asp Pleural Space W/img Guide  Result Date: 04/15/2018 INDICATION: Patient with history of decompensated heart failure, acute hypoxemic respiratory failure, and bilateral pleural effusion L>R. Request is made for diagnostic and therapeutic left thoracentesis. EXAM: ULTRASOUND GUIDED DIAGNOSTIC AND THERAPEUTIC LEFT THORACENTESIS MEDICATIONS: 10 mL of 1% lidocaine COMPLICATIONS: None immediate. PROCEDURE: An ultrasound guided thoracentesis was thoroughly discussed with the patient and questions answered. The benefits, risks, alternatives and complications were also discussed. The patient understands and wishes to proceed with the procedure. Written consent was obtained. Ultrasound was performed to localize and mark an adequate pocket of fluid in the left chest. The area was then prepped and draped in the normal sterile fashion. 1% Lidocaine was used for local anesthesia. Under ultrasound guidance a 6 Fr Safe-T-Centesis catheter was introduced. Thoracentesis was performed. The catheter was removed and a dressing applied. FINDINGS: A total of approximately 1.0 L of clear yellow fluid was removed. Samples were sent to the laboratory as requested by the clinical team. IMPRESSION: Successful ultrasound guided left  thoracentesis yielding 1.0 L of pleural fluid. Read by: Earley Abide, PA-C Electronically Signed   By: Marybelle Killings M.D.   On: 04/15/2018 14:05   US Thoracentesis Asp Pleural Space W/img Guide  Result Date: 04/09/2018 INDICATION: History of a.fib, CAD, CHF, prostate cancer, CKD III. Recently admitted to [facility] for dyspnea on exertion - found to be in acute hypoxic respiratory failure due to pulmonary edema and left pleural effusion. Request for diagnostic and therapeutic thoracentesis today. EXAM: ULTRASOUND GUIDED LEFT THORACENTESIS MEDICATIONS: 10 mL 1% lidocaine. COMPLICATIONS: None immediate. PROCEDURE: An ultrasound guided thoracentesis was thoroughly discussed with the patient and questions answered. The benefits, risks, alternatives and complications were also discussed. The patient understands and wishes to proceed with the procedure. Written consent was obtained. Ultrasound was performed to localize and mark an adequate pocket of fluid in the left chest. The area was then prepped and draped in the normal sterile fashion. 1% Lidocaine was used for local anesthesia. Under ultrasound guidance a 6 Fr Safe-T-Centesis catheter was introduced. Thoracentesis was performed. The catheter was removed and a dressing applied. FINDINGS: A total of approximately 1.0 L of yellow fluid was removed. Samples were sent to the laboratory as requested by the clinical team. IMPRESSION: Successful ultrasound guided left thoracentesis yielding 1.0 L of pleural fluid. Read by Candiss Norse, PA-C Electronically Signed   By: Markus Daft M.D.   On: 04/09/2018 14:50      PFT Results Latest Ref Rng & Units 08/11/2017  FVC-Pre L 2.20  FVC-Predicted Pre % 54  FVC-Post L 2.16  FVC-Predicted Post % 53  Pre FEV1/FVC % % 79  Post FEV1/FCV % % 84  FEV1-Pre L 1.74  FEV1-Predicted Pre % 59  DLCO UNC% % 67  DLCO COR %Predicted % 132  TLC L 4.77  TLC % Predicted % 69  RV % Predicted % 96    No results found for:  NITRICOXIDE      Assessment & Plan:   HCAP (healthcare-associated pneumonia) MRSA PNA 01/2018 with prolonged hospitalization and critical illness.  - he making good forward progress.  Had 2 additional admission after this with Decompensated CHF with transudative effusion and then recurrent PNA and exudative effusion .  He has improved with antibiotics and diuresis most recently. He did have some peripheral eosinophilia and elevated eosinophils in pleural analysis . He did not require steroids . He  is improved clinically and cxr is also improving . Will repeat CBC w/ diff in 4 weeks to look at eosinophil count .    OSA (obstructive sleep apnea) Pt has very severe OSA with comorbidities of A. fib and congestive heart failure.  He would greatly benefit from nocturnal CPAP.  He absolutely declines this at this time.  For now he can continue on oxygen at bedtime.  Once he is clinically improved from his critical illness.  Could consider looking to see if he might be a candidate for the INSPIRE procedure.    Physical deconditioning Complete home PT.   Morbid obesity (HCC) Wt loss   Chronic respiratory failure with hypoxia (HCC) Cont on O2 with act and At bedtime    Pleural effusion Recurrent effusions , now improving .    (HFpEF) heart failure with preserved ejection fraction (HCC)  D CHF - appears compensated at present  Keep follow up with Cardiology       Rexene Edison, NP 04/26/2018

## 2018-04-27 DIAGNOSIS — N183 Chronic kidney disease, stage 3 (moderate): Secondary | ICD-10-CM | POA: Diagnosis not present

## 2018-04-27 DIAGNOSIS — Z79891 Long term (current) use of opiate analgesic: Secondary | ICD-10-CM | POA: Diagnosis not present

## 2018-04-27 DIAGNOSIS — Z86711 Personal history of pulmonary embolism: Secondary | ICD-10-CM | POA: Diagnosis not present

## 2018-04-27 DIAGNOSIS — M1990 Unspecified osteoarthritis, unspecified site: Secondary | ICD-10-CM | POA: Diagnosis not present

## 2018-04-27 DIAGNOSIS — Z905 Acquired absence of kidney: Secondary | ICD-10-CM | POA: Diagnosis not present

## 2018-04-27 DIAGNOSIS — Z87891 Personal history of nicotine dependence: Secondary | ICD-10-CM | POA: Diagnosis not present

## 2018-04-27 DIAGNOSIS — I129 Hypertensive chronic kidney disease with stage 1 through stage 4 chronic kidney disease, or unspecified chronic kidney disease: Secondary | ICD-10-CM | POA: Diagnosis not present

## 2018-04-27 DIAGNOSIS — Z7901 Long term (current) use of anticoagulants: Secondary | ICD-10-CM | POA: Diagnosis not present

## 2018-04-27 DIAGNOSIS — I4891 Unspecified atrial fibrillation: Secondary | ICD-10-CM | POA: Diagnosis not present

## 2018-04-27 DIAGNOSIS — J449 Chronic obstructive pulmonary disease, unspecified: Secondary | ICD-10-CM | POA: Diagnosis not present

## 2018-04-27 DIAGNOSIS — Z85528 Personal history of other malignant neoplasm of kidney: Secondary | ICD-10-CM | POA: Diagnosis not present

## 2018-04-27 DIAGNOSIS — I2510 Atherosclerotic heart disease of native coronary artery without angina pectoris: Secondary | ICD-10-CM | POA: Diagnosis not present

## 2018-04-27 DIAGNOSIS — G4733 Obstructive sleep apnea (adult) (pediatric): Secondary | ICD-10-CM | POA: Diagnosis not present

## 2018-04-27 DIAGNOSIS — Z22322 Carrier or suspected carrier of Methicillin resistant Staphylococcus aureus: Secondary | ICD-10-CM | POA: Diagnosis not present

## 2018-04-27 DIAGNOSIS — Z8619 Personal history of other infectious and parasitic diseases: Secondary | ICD-10-CM | POA: Diagnosis not present

## 2018-04-27 DIAGNOSIS — Z96653 Presence of artificial knee joint, bilateral: Secondary | ICD-10-CM | POA: Diagnosis not present

## 2018-04-27 DIAGNOSIS — M109 Gout, unspecified: Secondary | ICD-10-CM | POA: Diagnosis not present

## 2018-04-28 ENCOUNTER — Telehealth: Payer: Self-pay | Admitting: Family Medicine

## 2018-04-28 NOTE — Telephone Encounter (Signed)
Copied from [place] [id number]. Topic: Quick Communication - See Telephone Encounter >> Apr 28, 2018 10:57 AM Ahmed Prima L wrote: CRM for notification. See Telephone encounter for: 04/28/18.  Patient would like to know does he still need to take colchicine 0.6 MG tablet, he is currently out of the medication. Took last dosage yesterday. He said it is for gout but does not have it anymore and would like to know does he need to refill it, there is one refill on the bottle.

## 2018-04-28 NOTE — Telephone Encounter (Signed)
See note

## 2018-04-28 NOTE — Telephone Encounter (Signed)
Would be reasonable to see how he does off the medicine for now. We have a visit on 05/10/18- if he has gout flare up could certainly refill and restart

## 2018-04-29 NOTE — Telephone Encounter (Signed)
Called and informed patient of Dr Ronney Lion recommendation. Patient agreed with plan and will call office if he heeds a refill.

## 2018-05-02 ENCOUNTER — Telehealth: Payer: Self-pay | Admitting: Pulmonary Disease

## 2018-05-02 NOTE — Telephone Encounter (Signed)
Called and spoke with patient he stated that [facility] is coming to get his oxygen tomorrow but he has not received supplies from [employer] just yet. Called [employer] and left a message regarding patient. Called [place] with [facility] and left a message for him to give [place] a call back.

## 2018-05-03 ENCOUNTER — Ambulatory Visit: Payer: Medicare Other

## 2018-05-03 ENCOUNTER — Ambulatory Visit: Payer: Medicare Other | Admitting: Cardiology

## 2018-05-03 DIAGNOSIS — Z8619 Personal history of other infectious and parasitic diseases: Secondary | ICD-10-CM | POA: Diagnosis not present

## 2018-05-03 DIAGNOSIS — J449 Chronic obstructive pulmonary disease, unspecified: Secondary | ICD-10-CM | POA: Diagnosis not present

## 2018-05-03 DIAGNOSIS — M109 Gout, unspecified: Secondary | ICD-10-CM | POA: Diagnosis not present

## 2018-05-03 DIAGNOSIS — Z87891 Personal history of nicotine dependence: Secondary | ICD-10-CM | POA: Diagnosis not present

## 2018-05-03 DIAGNOSIS — N183 Chronic kidney disease, stage 3 (moderate): Secondary | ICD-10-CM | POA: Diagnosis not present

## 2018-05-03 DIAGNOSIS — M1990 Unspecified osteoarthritis, unspecified site: Secondary | ICD-10-CM | POA: Diagnosis not present

## 2018-05-03 DIAGNOSIS — I4891 Unspecified atrial fibrillation: Secondary | ICD-10-CM | POA: Diagnosis not present

## 2018-05-03 DIAGNOSIS — I129 Hypertensive chronic kidney disease with stage 1 through stage 4 chronic kidney disease, or unspecified chronic kidney disease: Secondary | ICD-10-CM | POA: Diagnosis not present

## 2018-05-03 DIAGNOSIS — Z96653 Presence of artificial knee joint, bilateral: Secondary | ICD-10-CM | POA: Diagnosis not present

## 2018-05-03 DIAGNOSIS — Z22322 Carrier or suspected carrier of Methicillin resistant Staphylococcus aureus: Secondary | ICD-10-CM | POA: Diagnosis not present

## 2018-05-03 DIAGNOSIS — Z85528 Personal history of other malignant neoplasm of kidney: Secondary | ICD-10-CM | POA: Diagnosis not present

## 2018-05-03 DIAGNOSIS — I2510 Atherosclerotic heart disease of native coronary artery without angina pectoris: Secondary | ICD-10-CM | POA: Diagnosis not present

## 2018-05-03 DIAGNOSIS — G4733 Obstructive sleep apnea (adult) (pediatric): Secondary | ICD-10-CM | POA: Diagnosis not present

## 2018-05-03 DIAGNOSIS — Z7901 Long term (current) use of anticoagulants: Secondary | ICD-10-CM | POA: Diagnosis not present

## 2018-05-03 DIAGNOSIS — Z905 Acquired absence of kidney: Secondary | ICD-10-CM | POA: Diagnosis not present

## 2018-05-03 DIAGNOSIS — Z86711 Personal history of pulmonary embolism: Secondary | ICD-10-CM | POA: Diagnosis not present

## 2018-05-03 DIAGNOSIS — Z79891 Long term (current) use of opiate analgesic: Secondary | ICD-10-CM | POA: Diagnosis not present

## 2018-05-03 NOTE — Telephone Encounter (Signed)
Called and spoke to [place] with [employer], who stated supplies will be delivered to pt today.  Pt is aware of above message and voiced her understanding. Nothing further needed at this time.

## 2018-05-04 DIAGNOSIS — G4733 Obstructive sleep apnea (adult) (pediatric): Secondary | ICD-10-CM | POA: Diagnosis not present

## 2018-05-05 ENCOUNTER — Telehealth: Payer: Self-pay | Admitting: Family Medicine

## 2018-05-05 DIAGNOSIS — Z79891 Long term (current) use of opiate analgesic: Secondary | ICD-10-CM | POA: Diagnosis not present

## 2018-05-05 DIAGNOSIS — M1990 Unspecified osteoarthritis, unspecified site: Secondary | ICD-10-CM | POA: Diagnosis not present

## 2018-05-05 DIAGNOSIS — J449 Chronic obstructive pulmonary disease, unspecified: Secondary | ICD-10-CM | POA: Diagnosis not present

## 2018-05-05 DIAGNOSIS — Z86711 Personal history of pulmonary embolism: Secondary | ICD-10-CM | POA: Diagnosis not present

## 2018-05-05 DIAGNOSIS — Z22322 Carrier or suspected carrier of Methicillin resistant Staphylococcus aureus: Secondary | ICD-10-CM | POA: Diagnosis not present

## 2018-05-05 DIAGNOSIS — I2510 Atherosclerotic heart disease of native coronary artery without angina pectoris: Secondary | ICD-10-CM | POA: Diagnosis not present

## 2018-05-05 DIAGNOSIS — Z96653 Presence of artificial knee joint, bilateral: Secondary | ICD-10-CM | POA: Diagnosis not present

## 2018-05-05 DIAGNOSIS — I4891 Unspecified atrial fibrillation: Secondary | ICD-10-CM | POA: Diagnosis not present

## 2018-05-05 DIAGNOSIS — Z905 Acquired absence of kidney: Secondary | ICD-10-CM | POA: Diagnosis not present

## 2018-05-05 DIAGNOSIS — Z7901 Long term (current) use of anticoagulants: Secondary | ICD-10-CM | POA: Diagnosis not present

## 2018-05-05 DIAGNOSIS — Z87891 Personal history of nicotine dependence: Secondary | ICD-10-CM | POA: Diagnosis not present

## 2018-05-05 DIAGNOSIS — N183 Chronic kidney disease, stage 3 (moderate): Secondary | ICD-10-CM | POA: Diagnosis not present

## 2018-05-05 DIAGNOSIS — Z85528 Personal history of other malignant neoplasm of kidney: Secondary | ICD-10-CM | POA: Diagnosis not present

## 2018-05-05 DIAGNOSIS — G4733 Obstructive sleep apnea (adult) (pediatric): Secondary | ICD-10-CM | POA: Diagnosis not present

## 2018-05-05 DIAGNOSIS — I129 Hypertensive chronic kidney disease with stage 1 through stage 4 chronic kidney disease, or unspecified chronic kidney disease: Secondary | ICD-10-CM | POA: Diagnosis not present

## 2018-05-05 DIAGNOSIS — Z8619 Personal history of other infectious and parasitic diseases: Secondary | ICD-10-CM | POA: Diagnosis not present

## 2018-05-05 DIAGNOSIS — M109 Gout, unspecified: Secondary | ICD-10-CM | POA: Diagnosis not present

## 2018-05-05 NOTE — Telephone Encounter (Signed)
noted 

## 2018-05-05 NOTE — Telephone Encounter (Signed)
Copied from [place] [id number]. Topic: General - Other >> May 05, 2018  2:17 PM Carolyn Stare wrote:  Wayne Booth with [employer] call to say skill nursing has been completed and pt is being discharged

## 2018-05-06 DIAGNOSIS — I2510 Atherosclerotic heart disease of native coronary artery without angina pectoris: Secondary | ICD-10-CM | POA: Diagnosis not present

## 2018-05-06 DIAGNOSIS — N183 Chronic kidney disease, stage 3 (moderate): Secondary | ICD-10-CM | POA: Diagnosis not present

## 2018-05-06 DIAGNOSIS — Z86711 Personal history of pulmonary embolism: Secondary | ICD-10-CM | POA: Diagnosis not present

## 2018-05-06 DIAGNOSIS — Z85528 Personal history of other malignant neoplasm of kidney: Secondary | ICD-10-CM | POA: Diagnosis not present

## 2018-05-06 DIAGNOSIS — Z22322 Carrier or suspected carrier of Methicillin resistant Staphylococcus aureus: Secondary | ICD-10-CM | POA: Diagnosis not present

## 2018-05-06 DIAGNOSIS — J449 Chronic obstructive pulmonary disease, unspecified: Secondary | ICD-10-CM | POA: Diagnosis not present

## 2018-05-06 DIAGNOSIS — Z87891 Personal history of nicotine dependence: Secondary | ICD-10-CM | POA: Diagnosis not present

## 2018-05-06 DIAGNOSIS — Z96653 Presence of artificial knee joint, bilateral: Secondary | ICD-10-CM | POA: Diagnosis not present

## 2018-05-06 DIAGNOSIS — I129 Hypertensive chronic kidney disease with stage 1 through stage 4 chronic kidney disease, or unspecified chronic kidney disease: Secondary | ICD-10-CM | POA: Diagnosis not present

## 2018-05-06 DIAGNOSIS — Z79891 Long term (current) use of opiate analgesic: Secondary | ICD-10-CM | POA: Diagnosis not present

## 2018-05-06 DIAGNOSIS — Z8619 Personal history of other infectious and parasitic diseases: Secondary | ICD-10-CM | POA: Diagnosis not present

## 2018-05-06 DIAGNOSIS — Z7901 Long term (current) use of anticoagulants: Secondary | ICD-10-CM | POA: Diagnosis not present

## 2018-05-06 DIAGNOSIS — G4733 Obstructive sleep apnea (adult) (pediatric): Secondary | ICD-10-CM | POA: Diagnosis not present

## 2018-05-06 DIAGNOSIS — I4891 Unspecified atrial fibrillation: Secondary | ICD-10-CM | POA: Diagnosis not present

## 2018-05-06 DIAGNOSIS — M1990 Unspecified osteoarthritis, unspecified site: Secondary | ICD-10-CM | POA: Diagnosis not present

## 2018-05-06 DIAGNOSIS — Z905 Acquired absence of kidney: Secondary | ICD-10-CM | POA: Diagnosis not present

## 2018-05-06 DIAGNOSIS — M109 Gout, unspecified: Secondary | ICD-10-CM | POA: Diagnosis not present

## 2018-05-10 ENCOUNTER — Encounter: Payer: Self-pay | Admitting: Family Medicine

## 2018-05-10 ENCOUNTER — Ambulatory Visit (INDEPENDENT_AMBULATORY_CARE_PROVIDER_SITE_OTHER): Payer: Medicare Other | Admitting: Family Medicine

## 2018-05-10 VITALS — BP 126/78 | HR 67 | Temp 98.7°F | Ht 71.0 in | Wt 270.0 lb

## 2018-05-10 DIAGNOSIS — I5032 Chronic diastolic (congestive) heart failure: Secondary | ICD-10-CM

## 2018-05-10 DIAGNOSIS — J189 Pneumonia, unspecified organism: Secondary | ICD-10-CM

## 2018-05-10 DIAGNOSIS — R739 Hyperglycemia, unspecified: Secondary | ICD-10-CM | POA: Diagnosis not present

## 2018-05-10 DIAGNOSIS — G629 Polyneuropathy, unspecified: Secondary | ICD-10-CM

## 2018-05-10 DIAGNOSIS — T380X5A Adverse effect of glucocorticoids and synthetic analogues, initial encounter: Secondary | ICD-10-CM

## 2018-05-10 DIAGNOSIS — I10 Essential (primary) hypertension: Secondary | ICD-10-CM

## 2018-05-10 DIAGNOSIS — I700 Atherosclerosis of aorta: Secondary | ICD-10-CM | POA: Diagnosis not present

## 2018-05-10 DIAGNOSIS — Y95 Nosocomial condition: Secondary | ICD-10-CM

## 2018-05-10 LAB — COMPREHENSIVE METABOLIC PANEL
ALK PHOS: 72 U/L (ref 39–117)
ALT: 19 U/L (ref 0–53)
AST: 21 U/L (ref 0–37)
Albumin: 3.8 g/dL (ref 3.5–5.2)
BILIRUBIN TOTAL: 0.5 mg/dL (ref 0.2–1.2)
BUN: 21 mg/dL (ref 6–23)
CALCIUM: 9.3 mg/dL (ref 8.4–10.5)
CO2: 33 mEq/L — ABNORMAL HIGH (ref 19–32)
Chloride: 96 mEq/L (ref 96–112)
Creatinine, Ser: 1.14 mg/dL (ref 0.40–1.50)
GFR: 66.4 mL/min (ref >60.00)
GLUCOSE: 71 mg/dL (ref 70–99)
POTASSIUM: 4.5 meq/L (ref 3.5–5.1)
Sodium: 136 mEq/L (ref 135–145)
TOTAL PROTEIN: 7.4 g/dL (ref 6.0–8.3)

## 2018-05-10 LAB — CBC WITH DIFFERENTIAL/PLATELET
BASOS ABS: 0.1 10*3/uL (ref 0.0–0.1)
Basophils Relative: 1.3 % (ref 0.0–3.0)
Eosinophils Absolute: 1.3 10*3/uL — ABNORMAL HIGH (ref 0.0–0.7)
Eosinophils Relative: 15.2 % — ABNORMAL HIGH (ref 0.0–5.0)
HEMATOCRIT: 44.3 % (ref 39.0–52.0)
Hemoglobin: 14.9 g/dL (ref 13.0–17.0)
LYMPHS ABS: 2 10*3/uL (ref 0.7–4.0)
Lymphocytes Relative: 24.1 % (ref 12.0–46.0)
MCHC: 33.6 g/dL (ref 30.0–36.0)
MCV: 88.5 fl (ref 78.0–100.0)
MONOS PCT: 12.8 % — AB (ref 3.0–12.0)
Monocytes Absolute: 1.1 10*3/uL — ABNORMAL HIGH (ref 0.1–1.0)
NEUTROS ABS: 4 10*3/uL (ref 1.4–7.7)
NEUTROS PCT: 46.6 % (ref 43.0–77.0)
PLATELETS: 304 10*3/uL (ref 150.0–400.0)
RBC: 5 Mil/uL (ref 4.22–5.81)
RDW: 17 % — ABNORMAL HIGH (ref 11.5–15.5)
WBC: 8.5 10*3/uL (ref 4.0–10.5)

## 2018-05-10 LAB — VITAMIN B12: VITAMIN B 12: 495 pg/mL (ref 211–911)

## 2018-05-10 LAB — HEMOGLOBIN A1C: Hgb A1c MFr Bld: 5.9 % (ref 4.6–6.5)

## 2018-05-10 NOTE — Assessment & Plan Note (Signed)
S: patient continues to recover from prior MRSA pneumonia and later pleural effusions- requiring recurrent thoracentesis. Pulse ox much improved to 99% today from 92% last time and patient appears more comfortable breathing. Follow up x-ray was improved with pulmonology and he is glad to be seeing them again tomorrow- small pleural effusions still noted.  A/P: patient improved- he is thrilled with progress and thankful for pulmonary follow up tomorrow

## 2018-05-10 NOTE — Progress Notes (Signed)
Subjective:  Wayne Booth is a 77 y.o. year old very pleasant male patient who presents for/with See problem oriented charting ROS-shortness of breath and swelling in the legs have improved.  Minimal cough.  Feels much less fatigued.  Past Medical History-  Patient Active Problem List   Diagnosis Date Noted  . CAD (coronary artery disease) 04/09/2018    Priority: High  . HAP (hospital-acquired pneumonia) 04/08/2018    Priority: High  . (HFpEF) heart failure with preserved ejection fraction ([place]) 12/16/2017    Priority: High  . Chest pain     Priority: High  . Atrial flutter ([place]) 11/01/2017    Priority: High  . Chronic pulmonary embolism ([place]) 10/23/2016    Priority: High  . Asthmatic bronchitis 06/09/2011    Priority: High  . 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 History of renal cell carcinoma 12/26/2007    Priority: High  . CKD (chronic kidney disease) stage 3, GFR 30-59 ml/min (HCC)     Priority: Medium  . HCAP (healthcare-associated pneumonia) 02/03/2018    Priority: Medium  . History of adenomatous polyp of colon 04/12/2017    Priority: Medium  . Cervical disc disease 11/13/2015    Priority: Medium  . Essential tremor 08/14/2015    Priority: Medium  . Gout 08/14/2015    Priority: Medium  . Hypothyroidism 07/12/2014    Priority: Medium  . Hyperglycemia 07/12/2014    Priority: Medium  . GAD (generalized anxiety disorder) 11/07/2013    Priority: Medium  . OSA (obstructive sleep apnea) 07/07/2011    Priority: Medium  . Morbid obesity ([place]) 10/02/2008    Priority: Medium  . Insomnia 12/26/2007    Priority: Medium  . Essential hypertension 12/28/2006    Priority: Medium  . Constipation 01/21/2018    Priority: Low  . Anticoagulated     Priority: Low  . Bilateral foot pain 07/13/2017    Priority: Low  . Community acquired pneumonia 06/18/2017    Priority: Low  . Actinic keratosis 11/27/2009    Priority: Low  . Osteoarthritis 11/13/2008     Priority: Low  . TESTOSTERONE DEFICIENCY 12/28/2006    Priority: Low  . Aortic atherosclerosis ([place]) 05/10/2018  . Chronic respiratory failure with hypoxia ([place]) 04/26/2018  . First degree AV block 04/15/2018  . Thrombocytosis ([place]) 04/15/2018  . History of pulmonary embolus (PE) 04/15/2018  . Physical deconditioning 04/15/2018  . Pleural effusion 04/14/2018  . Chronic pain 04/09/2018  . Labile blood pressure   . Debility 02/26/2018    Medications- reviewed and updated Current Outpatient Medications  Medication Sig Dispense Refill  . albuterol (PROVENTIL HFA;VENTOLIN HFA) 108 (90 Base) MCG/ACT inhaler Inhale 2 puffs into the lungs every 6 (six) hours as needed for wheezing or shortness of breath. 1 Inhaler 3  . allopurinol (ZYLOPRIM) 100 MG tablet Take 1 tablet (100 mg total) by mouth daily. 30 tablet 6  . apixaban (ELIQUIS) 5 MG TABS tablet Take 1 tablet (5 mg total) by mouth 2 (two) times daily. 60 tablet 11  . colchicine 0.6 MG tablet Take 1 tablet (0.6 mg total) by mouth 2 (two) times daily. 60 tablet 0  . cyclobenzaprine (FLEXERIL) 10 MG tablet Take 1 tablet (10 mg total) by mouth 3 (three) times daily as needed for muscle spasms. 30 tablet 0  . docusate sodium (COLACE) 100 MG capsule Take 1 capsule (100 mg total) by mouth daily. 10 capsule 0  . escitalopram (LEXAPRO) 20 MG tablet Take 1  tablet (20 mg total) by mouth daily. 90 tablet 3  . furosemide (LASIX) 40 MG tablet Take 1 tablet (40 mg total) by mouth 2 (two) times daily. 30 tablet 0  . guaiFENesin-dextromethorphan (ROBITUSSIN DM) 100-10 MG/5ML syrup Take 5 mLs by mouth every 6 (six) hours as needed for cough. 118 mL 0  . levothyroxine (SYNTHROID, LEVOTHROID) 150 MCG tablet Take 1 tablet (150 mcg total) by mouth daily. 90 tablet 2  . Multiple Vitamins-Minerals (MULTIVITAMINS THER. W/MINERALS) TABS Take 1 tablet by mouth daily.    . nortriptyline (PAMELOR) 75 MG capsule Take 1 capsule (75 mg total) by mouth at bedtime. 90  capsule 1  . polyethylene glycol (MIRALAX / GLYCOLAX) packet Take 17 g by mouth daily. 14 each 0  . potassium chloride SA (K-DUR,KLOR-CON) 20 MEQ tablet Take 1 tablet (20 mEq total) by mouth 2 (two) times daily. 60 tablet 0  . propranolol (INDERAL) 10 MG tablet TAKE 1 TABLET (10 MG TOTAL) BY MOUTH 2 (TWO) TIMES DAILY. (Patient taking differently: Take 10 mg by mouth 2 (two) times daily. ) 60 tablet 3  . traMADol (ULTRAM) 50 MG tablet Take 1 tablet (50 mg total) by mouth every 6 (six) hours as needed for moderate pain. 30 tablet 2  . traZODone (DESYREL) 50 MG tablet TAKE ONE HALF TO ONE TABLET BY MOUTH EVERY NIGHT AT BEDTIME AS NEEDED FOR SLEEP (Patient taking differently: Take 25-50 mg by mouth at bedtime as needed for sleep. ) 90 tablet 1   No current facility-administered medications for this visit.     Objective: BP 126/78 (BP Location: Left Arm, Patient Position: Sitting, Cuff Size: Large)   Pulse 67   Temp 98.7 F (37.1 C) (Temporal)   Ht 5\' 11"  (1.803 m)   Wt 270 lb (122.5 kg)   SpO2 99%   BMI 37.66 kg/m  Gen: NAD, resting comfortably CV: RRR no murmurs rubs or gallops Lungs: CTAB no crackles, wheeze, rhonchi Abdomen: soft/nontender/obese Ext: Trace edema Skin: warm, dry  Assessment/Plan:  Hyperglycemia S: patient remains in prediabetes range. Earlier this year on steroids a1c got up to 6.3. He does report some burning in the soles of his feet and onto top of feet. Notes some pain in left low back radiating to knee on and off but burning in feet is more persistent Lab Results  Component Value Date   HGBA1C 5.9 05/10/2018  A/P: luckily prediabetes stable- a1c had been higher on steroids previously. I do wonder if he is having some neuropathy from his prediabetes. His b12 is normal so dont think that is contributing. Continue to work on weight loss.    (HFpEF) heart failure with preserved ejection fraction ([place]) S: patient remains on lasix 40mg  daily. Seems to be doing well  on that regimen. Weight is trending up some so will have to monitor that but breathing is improved and edema has not increased A/P: appears stable- continue current medications.   HAP (hospital-acquired pneumonia) S: patient continues to recover from prior MRSA pneumonia and later pleural effusions- requiring recurrent thoracentesis. Pulse ox much improved to 99% today from 92% last time and patient appears more comfortable breathing. Follow up x-ray was improved with pulmonology and he is glad to be seeing them again tomorrow- small pleural effusions still noted.  A/P: patient improved- he is thrilled with progress and thankful for pulmonary follow up tomorrow  Aortic atherosclerosis ([place]) S: per ct scan august 2019 A/P: Bp is controlled. Last LDL was under 70 even  without statin- continue risk factor modification to prevent progression.    Future Appointments  Date Time Provider [place]  05/11/2018  9:30 AM Parrett, Fonnie Mu, NP [facility] None  05/17/2018  3:00 PM Jettie Booze, MD [facility]  05/18/2018 10:20 AM Jamse Arn, MD [facility] CPR  07/20/2018  8:00 AM Marin Olp, MD [facility]  discussed 2-3 month follow up since much improved and has close pulmonary follow up  Lab/Order associations: Neuropathy - Plan: Hemoglobin A1c, Vitamin B12  Insomnia, unspecified type  Steroid-induced hyperglycemia - Plan: Hemoglobin A1c  Essential hypertension - Plan: CBC with Differential/Platelet, Comprehensive metabolic panel  Hyperglycemia  Chronic heart failure with preserved ejection fraction (HCC)  HAP (hospital-acquired pneumonia)  Aortic atherosclerosis ([place])  Return precautions advised.  [name], MD

## 2018-05-10 NOTE — Assessment & Plan Note (Signed)
S: patient remains in prediabetes range. Earlier this year on steroids a1c got up to 6.3. He does report some burning in the soles of his feet and onto top of feet. Notes some pain in left low back radiating to knee on and off but burning in feet is more persistent Lab Results  Component Value Date   HGBA1C 5.9 05/10/2018  A/P: luckily prediabetes stable- a1c had been higher on steroids previously. I do wonder if he is having some neuropathy from his prediabetes. His b12 is normal so dont think that is contributing. Continue to work on weight loss.

## 2018-05-10 NOTE — Assessment & Plan Note (Signed)
S: per ct scan august 2019 A/P: Bp is controlled. Last LDL was under 70 even without statin- continue risk factor modification to prevent progression.

## 2018-05-10 NOTE — Assessment & Plan Note (Signed)
S: patient remains on lasix 40mg  daily. Seems to be doing well on that regimen. Weight is trending up some so will have to monitor that but breathing is improved and edema has not increased A/P: appears stable- continue current medications.

## 2018-05-10 NOTE — Patient Instructions (Signed)
You are doing really well- hope you have a good visit with pulmonology tomorrow  Please stop by lab before you go

## 2018-05-11 ENCOUNTER — Ambulatory Visit: Payer: Medicare Other | Admitting: Adult Health

## 2018-05-13 ENCOUNTER — Ambulatory Visit: Payer: Medicare Other | Admitting: Family Medicine

## 2018-05-14 DIAGNOSIS — G4733 Obstructive sleep apnea (adult) (pediatric): Secondary | ICD-10-CM | POA: Diagnosis not present

## 2018-05-16 ENCOUNTER — Telehealth: Payer: Self-pay | Admitting: Family Medicine

## 2018-05-16 NOTE — Telephone Encounter (Signed)
Pt came in office in reference to needing letter stating he is unable to go to [place] at this time. Call pt when ready for pick up.

## 2018-05-17 ENCOUNTER — Ambulatory Visit: Payer: Medicare Other | Admitting: Interventional Cardiology

## 2018-05-18 ENCOUNTER — Encounter: Payer: Medicare Other | Attending: Physical Medicine & Rehabilitation | Admitting: Physical Medicine & Rehabilitation

## 2018-05-18 ENCOUNTER — Telehealth: Payer: Self-pay | Admitting: *Deleted

## 2018-05-18 DIAGNOSIS — Z8546 Personal history of malignant neoplasm of prostate: Secondary | ICD-10-CM | POA: Insufficient documentation

## 2018-05-18 DIAGNOSIS — I2510 Atherosclerotic heart disease of native coronary artery without angina pectoris: Secondary | ICD-10-CM | POA: Insufficient documentation

## 2018-05-18 DIAGNOSIS — J9690 Respiratory failure, unspecified, unspecified whether with hypoxia or hypercapnia: Secondary | ICD-10-CM | POA: Insufficient documentation

## 2018-05-18 DIAGNOSIS — J449 Chronic obstructive pulmonary disease, unspecified: Secondary | ICD-10-CM | POA: Insufficient documentation

## 2018-05-18 DIAGNOSIS — N183 Chronic kidney disease, stage 3 (moderate): Secondary | ICD-10-CM | POA: Insufficient documentation

## 2018-05-18 DIAGNOSIS — F419 Anxiety disorder, unspecified: Secondary | ICD-10-CM | POA: Insufficient documentation

## 2018-05-18 DIAGNOSIS — R5381 Other malaise: Secondary | ICD-10-CM | POA: Insufficient documentation

## 2018-05-18 DIAGNOSIS — I129 Hypertensive chronic kidney disease with stage 1 through stage 4 chronic kidney disease, or unspecified chronic kidney disease: Secondary | ICD-10-CM | POA: Insufficient documentation

## 2018-05-18 DIAGNOSIS — Z9889 Other specified postprocedural states: Secondary | ICD-10-CM | POA: Insufficient documentation

## 2018-05-18 DIAGNOSIS — Z8701 Personal history of pneumonia (recurrent): Secondary | ICD-10-CM | POA: Insufficient documentation

## 2018-05-18 DIAGNOSIS — M109 Gout, unspecified: Secondary | ICD-10-CM | POA: Insufficient documentation

## 2018-05-18 DIAGNOSIS — E039 Hypothyroidism, unspecified: Secondary | ICD-10-CM | POA: Insufficient documentation

## 2018-05-18 DIAGNOSIS — E669 Obesity, unspecified: Secondary | ICD-10-CM | POA: Insufficient documentation

## 2018-05-18 DIAGNOSIS — G4733 Obstructive sleep apnea (adult) (pediatric): Secondary | ICD-10-CM | POA: Insufficient documentation

## 2018-05-18 DIAGNOSIS — Z85528 Personal history of other malignant neoplasm of kidney: Secondary | ICD-10-CM | POA: Insufficient documentation

## 2018-05-18 DIAGNOSIS — Z905 Acquired absence of kidney: Secondary | ICD-10-CM | POA: Insufficient documentation

## 2018-05-18 DIAGNOSIS — Z7901 Long term (current) use of anticoagulants: Secondary | ICD-10-CM | POA: Insufficient documentation

## 2018-05-18 DIAGNOSIS — I4891 Unspecified atrial fibrillation: Secondary | ICD-10-CM | POA: Insufficient documentation

## 2018-05-18 DIAGNOSIS — Z6838 Body mass index (BMI) 38.0-38.9, adult: Secondary | ICD-10-CM | POA: Insufficient documentation

## 2018-05-18 NOTE — Telephone Encounter (Signed)
Pt returned call. Advised per message below. Pt expressed understanding, he will call his insurance now for suggestions and will call the office back.

## 2018-05-18 NOTE — Telephone Encounter (Signed)
Called patient and left a voicemail message asking for a return phone call. We do not have any samples here in the office. Can he call his insurance and see what they recommend as a cheaper alternative? We won't know the cost here in this office.

## 2018-05-18 NOTE — Telephone Encounter (Signed)
Copied from [place] [phone]. Topic: General - Other >> May 18, 2018 11:06 AM Carolyn Stare wrote:  Pt said he is in the doughnut hole with his insurance and the below med is 341.00,   he is asking if something cheaper can be called in and if the office has any samples  apixaban (ELIQUIS) 5 MG TABS tablet

## 2018-05-18 NOTE — Telephone Encounter (Signed)
See note

## 2018-05-23 ENCOUNTER — Telehealth: Payer: Self-pay | Admitting: Family Medicine

## 2018-05-23 NOTE — Telephone Encounter (Signed)
Copied from [place] [phone]. Topic: Quick Communication - See Telephone Encounter >> May 23, 2018  3:45 PM Cecelia Byars, NT wrote: CRM for notification. See Telephone encounter for: 05/23/18. Patient  wife and son Marguerite Olea and are concerned the patient may have memory issues and would like to come in to speak with Dr Yong Channel , please call [phone] with how to proceed .

## 2018-05-23 NOTE — Telephone Encounter (Signed)
See note

## 2018-05-25 ENCOUNTER — Ambulatory Visit: Payer: Medicare Other | Admitting: Family Medicine

## 2018-05-25 ENCOUNTER — Telehealth: Payer: Self-pay | Admitting: Family Medicine

## 2018-05-25 NOTE — Telephone Encounter (Signed)
Wife was in for appointment today. Brought paper work with her regarding cancelling trip

## 2018-05-25 NOTE — Telephone Encounter (Signed)
From wife's note "She is particularly concerned about his memory. Asking questions 3-4 times. Forgetting things just talked about or things he may know the answer to. Family has called his attention to it and he states there is nothing wrong with him- he even refused to come in today even though son scheduled it for him. No falls. Having to use his oxygen fair bit.   Due to his illness- he was unable to travel to [place] and family has been trying to get $ refunded. Have some forms for me today."  Jamie-can you please call him next week and help him schedule a wellness visit.  Please tell him he is overdue and I am recommending this for his health.  Make notes that a full MMSE needs to be completed.  I am hoping he will be willing to come in if this test shows memory issues-he is declining for now  Patient has not done well overall late this year with multiple hospitalizations- I do not believe he is fit for travel out of the state-I am filling out a form for him.

## 2018-05-25 NOTE — Telephone Encounter (Signed)
[name] came in this morning and seen Dr. Yong Channel

## 2018-05-31 NOTE — Telephone Encounter (Signed)
See note

## 2018-05-31 NOTE — Telephone Encounter (Signed)
Pt called to check status of paper work regarding [place] refund. Please advise [phone]

## 2018-06-01 NOTE — Telephone Encounter (Signed)
Letter was given to patient's wife

## 2018-06-03 DIAGNOSIS — G4733 Obstructive sleep apnea (adult) (pediatric): Secondary | ICD-10-CM | POA: Diagnosis not present

## 2018-06-10 ENCOUNTER — Encounter: Payer: Self-pay | Admitting: Family Medicine

## 2018-06-10 ENCOUNTER — Ambulatory Visit (INDEPENDENT_AMBULATORY_CARE_PROVIDER_SITE_OTHER): Payer: Medicare Other | Admitting: Family Medicine

## 2018-06-10 VITALS — BP 120/80 | HR 84 | Ht 71.0 in | Wt 283.6 lb

## 2018-06-10 DIAGNOSIS — M159 Polyosteoarthritis, unspecified: Secondary | ICD-10-CM

## 2018-06-10 DIAGNOSIS — R413 Other amnesia: Secondary | ICD-10-CM | POA: Diagnosis not present

## 2018-06-10 DIAGNOSIS — M150 Primary generalized (osteo)arthritis: Secondary | ICD-10-CM

## 2018-06-10 DIAGNOSIS — M8949 Other hypertrophic osteoarthropathy, multiple sites: Secondary | ICD-10-CM

## 2018-06-10 DIAGNOSIS — J9611 Chronic respiratory failure with hypoxia: Secondary | ICD-10-CM

## 2018-06-10 DIAGNOSIS — I5032 Chronic diastolic (congestive) heart failure: Secondary | ICD-10-CM

## 2018-06-10 LAB — CBC
HCT: 45.3 % (ref 39.0–52.0)
HEMOGLOBIN: 15 g/dL (ref 13.0–17.0)
MCHC: 33.1 g/dL (ref 30.0–36.0)
MCV: 88.9 fl (ref 78.0–100.0)
PLATELETS: 331 10*3/uL (ref 150.0–400.0)
RBC: 5.1 Mil/uL (ref 4.22–5.81)
RDW: 15.8 % — ABNORMAL HIGH (ref 11.5–15.5)
WBC: 9.3 10*3/uL (ref 4.0–10.5)

## 2018-06-10 LAB — COMPREHENSIVE METABOLIC PANEL
ALK PHOS: 71 U/L (ref 39–117)
ALT: 20 U/L (ref 0–53)
AST: 20 U/L (ref 0–37)
Albumin: 3.7 g/dL (ref 3.5–5.2)
BILIRUBIN TOTAL: 0.4 mg/dL (ref 0.2–1.2)
BUN: 15 mg/dL (ref 6–23)
CO2: 29 mEq/L (ref 19–32)
Calcium: 9.2 mg/dL (ref 8.4–10.5)
Chloride: 102 mEq/L (ref 96–112)
Creatinine, Ser: 1.03 mg/dL (ref 0.40–1.50)
GFR: 74.63 mL/min (ref >60.00)
GLUCOSE: 89 mg/dL (ref 70–99)
Potassium: 4.9 mEq/L (ref 3.5–5.1)
Sodium: 137 mEq/L (ref 135–145)
TOTAL PROTEIN: 7 g/dL (ref 6.0–8.3)

## 2018-06-10 LAB — TSH: TSH: 0.45 u[IU]/mL (ref 0.35–4.50)

## 2018-06-10 LAB — VITAMIN B12: VITAMIN B 12: 513 pg/mL (ref 211–911)

## 2018-06-10 MED ORDER — TRAMADOL HCL 50 MG PO TABS: 50.0000 mg | ORAL_TABLET | Freq: Four times a day (QID) | ORAL | 2 refills | Status: DC | PRN

## 2018-06-10 MED ORDER — CYCLOBENZAPRINE HCL 10 MG PO TABS: 10.0000 mg | ORAL_TABLET | Freq: Three times a day (TID) | ORAL | 0 refills | Status: DC | PRN

## 2018-06-10 NOTE — Assessment & Plan Note (Signed)
S: Patient denies any issues with his memory.  Wife reports patient seems more forgetful and is asking the same questions over and over.  She is particularly concerned because apparently a family member had similar issues and ended up having brain cancer.  Wife thinks he has had some memory issues since before hospitalizations but that issues have been worse since hospitalizations.  Apparently church members and son have noted issues as well. A/P: Memory loss with MMSE score of 25 out of 30 today- loses for for spelling world backwards and 1 for drawing interlocking pentagons. - PHQ9 today of 9-a lot of his issues are sleep and fatigue related-denies depressed mood.  We will continue Lexapro at current doses as well as trazodone for sleep - neuro exam reassuring  -Even with reassuring neuro exam-we will get MRI given sudden drop off after hospitalization- want to rule out potential stroke.  Doubt mass. -labs today tsh, b12, CBC, CMP -They declined HIV and syphilis testing

## 2018-06-10 NOTE — Progress Notes (Signed)
Subjective:  Wayne Booth is a 77 y.o. year old very pleasant male patient who presents for/with See problem oriented charting ROS-patient denies any memory concerns.  Denies headaches or blurry vision.  Does admit to stable shortness of breath.  No increased edema.  Has not eaten the best and has noted some weight gain.  Past Medical History-  Patient Active Problem List   Diagnosis Date Noted  . Chronic respiratory failure with hypoxia (HCC) 04/26/2018    Priority: High  . CAD (coronary artery disease) 04/09/2018    Priority: High  . HAP (hospital-acquired pneumonia) 04/08/2018    Priority: High  . (HFpEF) heart failure with preserved ejection fraction ([place]) 12/16/2017    Priority: High  . Chest pain     Priority: High  . Atrial flutter ([place]) 11/01/2017    Priority: High  . Chronic pulmonary embolism ([place]) 10/23/2016    Priority: High  . Asthmatic bronchitis 06/09/2011    Priority: High  . 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 History of renal cell carcinoma 12/26/2007    Priority: High  . Memory loss 06/10/2018    Priority: Medium  . CKD (chronic kidney disease) stage 3, GFR 30-59 ml/min (HCC)     Priority: Medium  . HCAP (healthcare-associated pneumonia) 02/03/2018    Priority: Medium  . History of adenomatous polyp of colon 04/12/2017    Priority: Medium  . Cervical disc disease 11/13/2015    Priority: Medium  . Essential tremor 08/14/2015    Priority: Medium  . Gout 08/14/2015    Priority: Medium  . Hypothyroidism 07/12/2014    Priority: Medium  . Hyperglycemia 07/12/2014    Priority: Medium  . GAD (generalized anxiety disorder) 11/07/2013    Priority: Medium  . OSA (obstructive sleep apnea) 07/07/2011    Priority: Medium  . Morbid obesity ([place]) 10/02/2008    Priority: Medium  . Insomnia 12/26/2007    Priority: Medium  . Essential hypertension 12/28/2006    Priority: Medium  . Aortic atherosclerosis ([place]) 05/10/2018    Priority: Low   . Constipation 01/21/2018    Priority: Low  . Anticoagulated     Priority: Low  . Bilateral foot pain 07/13/2017    Priority: Low  . Community acquired pneumonia 06/18/2017    Priority: Low  . Actinic keratosis 11/27/2009    Priority: Low  . Osteoarthritis 11/13/2008    Priority: Low  . TESTOSTERONE DEFICIENCY 12/28/2006    Priority: Low  . First degree AV block 04/15/2018  . Pleural effusion 04/14/2018  . Chronic pain 04/09/2018  . Labile blood pressure     Medications- reviewed and updated Current Outpatient Medications  Medication Sig Dispense Refill  . albuterol (PROVENTIL HFA;VENTOLIN HFA) 108 (90 Base) MCG/ACT inhaler Inhale 2 puffs into the lungs every 6 (six) hours as needed for wheezing or shortness of breath. 1 Inhaler 3  . allopurinol (ZYLOPRIM) 100 MG tablet Take 1 tablet (100 mg total) by mouth daily. 30 tablet 6  . apixaban (ELIQUIS) 5 MG TABS tablet Take 1 tablet (5 mg total) by mouth 2 (two) times daily. 60 tablet 11  . colchicine 0.6 MG tablet Take 1 tablet (0.6 mg total) by mouth 2 (two) times daily. 60 tablet 0  . cyclobenzaprine (FLEXERIL) 10 MG tablet Take 1 tablet (10 mg total) by mouth 3 (three) times daily as needed for muscle spasms. 30 tablet 0  . docusate sodium (COLACE) 100 MG capsule Take 1 capsule (100 mg total)  by mouth daily. 10 capsule 0  . escitalopram (LEXAPRO) 20 MG tablet Take 1 tablet (20 mg total) by mouth daily. 90 tablet 3  . furosemide (LASIX) 40 MG tablet Take 1 tablet (40 mg total) by mouth 2 (two) times daily. 30 tablet 0  . guaiFENesin-dextromethorphan (ROBITUSSIN DM) 100-10 MG/5ML syrup Take 5 mLs by mouth every 6 (six) hours as needed for cough. 118 mL 0  . levothyroxine (SYNTHROID, LEVOTHROID) 150 MCG tablet Take 1 tablet (150 mcg total) by mouth daily. 90 tablet 2  . Multiple Vitamins-Minerals (MULTIVITAMINS THER. W/MINERALS) TABS Take 1 tablet by mouth daily.    . nortriptyline (PAMELOR) 75 MG capsule Take 1 capsule (75 mg total)  by mouth at bedtime. 90 capsule 1  . polyethylene glycol (MIRALAX / GLYCOLAX) packet Take 17 g by mouth daily. 14 each 0  . potassium chloride SA (K-DUR,KLOR-CON) 20 MEQ tablet Take 1 tablet (20 mEq total) by mouth 2 (two) times daily. 60 tablet 0  . propranolol (INDERAL) 10 MG tablet TAKE 1 TABLET (10 MG TOTAL) BY MOUTH 2 (TWO) TIMES DAILY. (Patient taking differently: Take 10 mg by mouth 2 (two) times daily. ) 60 tablet 3  . traMADol (ULTRAM) 50 MG tablet Take 1 tablet (50 mg total) by mouth every 6 (six) hours as needed for moderate pain. 30 tablet 2  . traZODone (DESYREL) 50 MG tablet TAKE ONE HALF TO ONE TABLET BY MOUTH EVERY NIGHT AT BEDTIME AS NEEDED FOR SLEEP (Patient taking differently: Take 25-50 mg by mouth at bedtime as needed for sleep. ) 90 tablet 1   No current facility-administered medications for this visit.     Objective: BP 120/80 (BP Location: Left Arm, Patient Position: Sitting, Cuff Size: Normal)   Pulse 84   Ht 5\' 11"  (1.803 m)   Wt 283 lb 9.6 oz (128.6 kg)   SpO2 95%   BMI 39.55 kg/m  Gen: NAD, resting comfortably CV: RRR no murmurs rubs or gallops Lungs:  Crackles bilateral lung bases. no wheeze, rhonchi Abdomen: soft/nontender/nondistended/normal bowel sounds. No rebound or guarding.  Ext: trace edema Skin: warm, dry Neuro: CN II-XII intact, sensation and reflexes normal throughout, 5/5 muscle strength in bilateral upper and lower extremities. Normal finger to nose. Normal rapid alternating movements. No pronator drift. Normal romberg. Normal gait. Some lag in responses to requests during exam Psych: some agitation about testing today  Assessment/Plan:  (HFpEF) heart failure with preserved ejection fraction ([place]) S: short of breath since getting out of hospital in October- improved but then peaked and hasn't done better. Using oxygen at bedtime. Taking lasix twice a day- they think but will confirm once home.  Wife called pharmacy during visit and confirms he  is taking 40 mg twice a day A/P: I offered patient chest x-ray and further evaluation about his shortness of breath but he wants to monitor only for now and states it is been the same since initial improvement after leaving hospital.  He agrees to follow-up with new or worsening symptoms-may need to get him plugged back into cardiology  He does have trace edema and has had some weight gain-has not eaten the best over the holidays though.  Crackles appear new to me today but patient states has had since discharge from hospital per home evaluations  Memory loss S: Patient denies any issues with his memory.  Wife reports patient seems more forgetful and is asking the same questions over and over.  She is particularly concerned because apparently a  family member had similar issues and ended up having brain cancer.  Wife thinks he has had some memory issues since before hospitalizations but that issues have been worse since hospitalizations.  Apparently church members and son have noted issues as well. A/P: Memory loss with MMSE score of 25 out of 30 today- loses for for spelling world backwards and 1 for drawing interlocking pentagons. - PHQ9 today of 9-a lot of his issues are sleep and fatigue related-denies depressed mood.  We will continue Lexapro at current doses as well as trazodone for sleep - neuro exam reassuring  -Even with reassuring neuro exam-we will get MRI given sudden drop off after hospitalization- want to rule out potential stroke.  Doubt mass. -labs today tsh, b12, CBC, CMP -They declined HIV and syphilis testing  Osteoarthritis S: Patient with history of total knee replacement on the right with Dr. Alvan Dame.  Has hip pain and low back pain likely related arthritis.  Tramadol and Flexeril give him reasonable relief A/P: Stable-refilled medications.  Much prefer tramadol over NSAIDs given unilateral kidney.  Continue lower dose tramadol-I think he can tolerate this along with low-dose  trazodone and Lexapro and nortriptyline-doubt high serotonin syndrome risk  Future Appointments  Date Time Provider [place]  07/20/2018  8:00 AM Marin Olp, MD [facility]  We may recheck his memory test next month  Lab/Order associations: Memory loss - Plan: CBC, Comprehensive metabolic panel, TSH, Vitamin B12, MR Brain W Wo Contrast  Chronic heart failure with preserved ejection fraction (HCC)  Chronic respiratory failure with hypoxia (HCC)  Primary osteoarthritis involving multiple joints  Meds ordered this encounter  Medications  . traMADol (ULTRAM) 50 MG tablet    Sig: Take 1 tablet (50 mg total) by mouth every 6 (six) hours as needed for moderate pain.    Dispense:  30 tablet    Refill:  2  . cyclobenzaprine (FLEXERIL) 10 MG tablet    Sig: Take 1 tablet (10 mg total) by mouth 3 (three) times daily as needed for muscle spasms.    Dispense:  30 tablet    Refill:  0    Return precautions advised.  [name], MD

## 2018-06-10 NOTE — Assessment & Plan Note (Signed)
S: short of breath since getting out of hospital in October- improved but then peaked and hasn't done better. Using oxygen at bedtime. Taking lasix twice a day- they think but will confirm once home.  Wife called pharmacy during visit and confirms he is taking 40 mg twice a day A/P: I offered patient chest x-ray and further evaluation about his shortness of breath but he wants to monitor only for now and states it is been the same since initial improvement after leaving hospital.  He agrees to follow-up with new or worsening symptoms-may need to get him plugged back into cardiology  He does have trace edema and has had some weight gain-has not eaten the best over the holidays though.  Crackles appear new to me today but patient states has had since discharge from hospital per home evaluations

## 2018-06-10 NOTE — Assessment & Plan Note (Signed)
S: Patient with history of total knee replacement on the right with Dr. Alvan Dame.  Has hip pain and low back pain likely related arthritis.  Tramadol and Flexeril give him reasonable relief A/P: Stable-refilled medications.  Much prefer tramadol over NSAIDs given unilateral kidney.  Continue lower dose tramadol-I think he can tolerate this along with low-dose trazodone and Lexapro and nortriptyline-doubt high serotonin syndrome risk

## 2018-06-10 NOTE — Patient Instructions (Addendum)
Please stop by lab before you go   We will call you within two weeks about your referral to MRI. If you do not hear within 3 weeks, give [place] a call.   Please confirm you are taking your Lasix twice a day.  Lets consider rechecking test either next month at follow-up or perhaps 3 to 6 months from now.  Please let me know if you have any worsening symptoms such as memory issues or worsening shortness of breath

## 2018-06-13 ENCOUNTER — Ambulatory Visit: Payer: Self-pay

## 2018-06-13 DIAGNOSIS — G4733 Obstructive sleep apnea (adult) (pediatric): Secondary | ICD-10-CM | POA: Diagnosis not present

## 2018-06-13 NOTE — Telephone Encounter (Signed)
Pt calling for results; reviewed in result note.

## 2018-06-19 ENCOUNTER — Other Ambulatory Visit: Payer: Self-pay | Admitting: Family Medicine

## 2018-06-23 ENCOUNTER — Ambulatory Visit (INDEPENDENT_AMBULATORY_CARE_PROVIDER_SITE_OTHER): Payer: Medicare Other

## 2018-06-23 ENCOUNTER — Encounter: Payer: Self-pay | Admitting: Family Medicine

## 2018-06-23 ENCOUNTER — Ambulatory Visit (INDEPENDENT_AMBULATORY_CARE_PROVIDER_SITE_OTHER): Payer: Medicare Other | Admitting: Family Medicine

## 2018-06-23 VITALS — BP 122/76 | HR 102 | Temp 97.5°F | Ht 71.0 in | Wt 282.8 lb

## 2018-06-23 DIAGNOSIS — R059 Cough, unspecified: Secondary | ICD-10-CM

## 2018-06-23 DIAGNOSIS — R05 Cough: Secondary | ICD-10-CM | POA: Diagnosis not present

## 2018-06-23 LAB — BRAIN NATRIURETIC PEPTIDE: Pro B Natriuretic peptide (BNP): 30 pg/mL (ref 0.0–100.0)

## 2018-06-23 MED ORDER — AZITHROMYCIN 250 MG PO TABS: ORAL_TABLET | ORAL | 0 refills | Status: DC

## 2018-06-23 MED ORDER — AMOXICILLIN-POT CLAVULANATE 875-125 MG PO TABS: 1.0000 | ORAL_TABLET | Freq: Two times a day (BID) | ORAL | 0 refills | Status: DC

## 2018-06-23 NOTE — Patient Instructions (Addendum)
Increase lasix to 80mg  x 3 days. Can take your 40mg  twice a day   Giving you antibiotics to cover for pneumonia, but CXR looks more like pleural effusions.   Cool mist humidifier to use at night Honey is great for cough Robitussin DM for cough   If increased shortness of breath or fevers... go to ER

## 2018-06-23 NOTE — Progress Notes (Signed)
Patient: Wayne Booth MRN: [id number] DOB: [birth year] PCP: Wayne Olp, MD     Subjective:  Chief Complaint  Patient presents with  . Cough  . sinus congestion    HPI: The patient is a 77 y.o. male who presents today for cough and sinus congestion. He started to feel bad a few days ago, but he wanted to come in get checked out. No fever, but has had chills. He has a stuffy head, left ear feels weird, sinus pain and pressure. He has a cough that is very productive in nature. His sputum is green in color. He has no COPD/lung issues. He is on eliquis for chronic PE. He does have some shortness of breath and wheezing at times. His wife was sick, but got better. He has not taken anything over the counter.  He has CHF with EF of 50-55% in 01/2018. He also has Afib that was cardioverted and is in NSR.   Review of Systems  Constitutional: Positive for chills and fatigue. Negative for fever.  HENT: Positive for congestion, ear pain and sinus pain. Negative for sore throat.        Left ear feels clogged  Respiratory: Positive for cough, shortness of breath and wheezing. Negative for chest tightness.   Cardiovascular: Negative for chest pain.  Gastrointestinal: Negative for abdominal pain and nausea.  Musculoskeletal: Negative for arthralgias, back pain, myalgias and neck pain.  Neurological: Negative for dizziness and headaches.    Allergies Patient is allergic to ambien [zolpidem].  Past Medical History Patient  has a past medical history of Allergy, Anxiety, Arthritis, Atrial fibrillation ([place]), Cancer ([place]), Coronary artery disease, History of total knee replacement, Hypertension, Hypothyroidism, Kidney cancer, primary, with metastasis from kidney to other site [facility]), Kidney stone, OSA (obstructive sleep apnea), Pituitary cyst ([place]), Pneumonia, and Prostate cancer ([place]).  Surgical History Patient  has a past surgical history that includes Appendectomy; Cholecystectomy; Nephrectomy  (Left); Insertion prostate radiation seed; Replacement total knee bilateral; Knee arthroscopy (2007); Anterior cervical decomp/discectomy fusion (N/A, 08/29/2012); Craniotomy (N/A, 11/08/2012); Colonoscopy w/ polypectomy; Kidney stone surgery; Cardiac catheterization (2008); Posterior cervical laminectomy (Left, 04/24/2015); Finger surgery (Left, 2017); Ablation of dysrhythmic focus (01/28/2018); ATRIAL FIBRILLATION ABLATION (N/A, 01/28/2018); and IR THORACENTESIS ASP PLEURAL SPACE W/IMG GUIDE (04/15/2018).  Family History Pateint's family history includes Cancer in his father and mother; Colon cancer (age of onset: 36) in his brother; Kidney cancer in his sister.  Social History Patient  reports that he has quit smoking. His smoking use included cigars. He quit after 4.00 years of use. He has never used smokeless tobacco. He reports current alcohol use of about 2.0 standard drinks of alcohol per week. He reports that he does not use drugs.    Objective: Vitals:   06/23/18 1109  BP: 122/76  Pulse: (!) 102  Temp: (!) 97.5 F (36.4 C)  TempSrc: Oral  SpO2: 100%  Weight: 282 lb 12.8 oz (128.3 kg)  Height: 5\' 11"  (1.803 m)    Body mass index is 39.44 kg/m.  Physical Exam Vitals signs reviewed.  Constitutional:      General: He is not in acute distress.    Appearance: He is obese. He is not ill-appearing.  HENT:     Right Ear: Tympanic membrane and ear canal normal.     Left Ear: Tympanic membrane and ear canal normal.     Nose: Nose normal.     Comments: No ttp over sinuses  Neck:     Musculoskeletal: Normal  range of motion and neck supple.  Cardiovascular:     Rate and Rhythm: Normal rate and regular rhythm.     Heart sounds: Normal heart sounds.  Pulmonary:     Effort: Pulmonary effort is normal.     Breath sounds: Wheezing (very occasional expiratory wheeze. left upper lobe ) and rales (bilateral bases ) present.  Abdominal:     General: Abdomen is flat.     Palpations: Abdomen  is soft.  Lymphadenopathy:     Cervical: No cervical adenopathy.  Skin:    Capillary Refill: Capillary refill takes less than 2 seconds.  Neurological:     General: No focal deficit present.     Mental Status: He is alert.    CXR" no consolidation. Looks stable from 03/2018. No new effusions. Official read pending.     Assessment/plan: 1. Cough Do not think he has pneumonia from CXR. Looks stable. Will cover him for pneumonia as he is very scared he could have this and it feels the same as last time. Discussed we will also check his BNP and increase lasix by 40mg  x 3 days to help diuresis him as his CXR looks like possible small effusions, but again could be chronic. Thoracentesis done in October over last hospitalization. His weight has been stable over the past few weeks with only one pound gained. Discussed if fever/worsening shortness of breath he needs to go to ER. Very strict precautions given.  - DG Chest 2 View; Future - Brain natriuretic peptide     Return if symptoms worsen or fail to improve.   Orma Flaming, MD [place]   06/23/2018

## 2018-06-24 ENCOUNTER — Other Ambulatory Visit: Payer: Self-pay | Admitting: Family Medicine

## 2018-06-24 ENCOUNTER — Telehealth: Payer: Self-pay | Admitting: Family Medicine

## 2018-06-24 NOTE — Telephone Encounter (Signed)
Left message to call office - may speak with any nurse available.

## 2018-06-24 NOTE — Telephone Encounter (Unsigned)
Copied from [place] [id number]. Topic: Quick Communication - Lab Results (Clinic Use ONLY) >> Jun 24, 2018  9:53 AM Yvette Rack wrote: Pt returned call for lab results. Pt requests call back. Cb# [phone]

## 2018-06-25 ENCOUNTER — Other Ambulatory Visit: Payer: Self-pay | Admitting: Family Medicine

## 2018-06-30 ENCOUNTER — Other Ambulatory Visit: Payer: Self-pay | Admitting: Family Medicine

## 2018-07-01 ENCOUNTER — Telehealth: Payer: Self-pay | Admitting: Family Medicine

## 2018-07-01 NOTE — Telephone Encounter (Signed)
Can you assist?

## 2018-07-01 NOTE — Telephone Encounter (Signed)
See note  Copied from [place]. Topic: General - Other >> Jul 01, 2018  3:41 PM Bea Graff, NT wrote: Reason for CRM: Pts wife states a MRI was ordered 3 weeks ago by Dr. Yong Channel and the hospital has not contacted the pt to schedule. She states Dr. Yong Channel advised for her to call if they have not heard anything.

## 2018-07-04 DIAGNOSIS — G4733 Obstructive sleep apnea (adult) (pediatric): Secondary | ICD-10-CM | POA: Diagnosis not present

## 2018-07-14 DIAGNOSIS — G4733 Obstructive sleep apnea (adult) (pediatric): Secondary | ICD-10-CM | POA: Diagnosis not present

## 2018-07-16 ENCOUNTER — Ambulatory Visit
Admission: RE | Admit: 2018-07-16 | Discharge: 2018-07-16 | Disposition: A | Discharge status: Home / Self Care | Payer: Medicare Other | Source: Ambulatory Visit | Attending: Family Medicine | Admitting: Family Medicine

## 2018-07-16 DIAGNOSIS — R413 Other amnesia: Secondary | ICD-10-CM

## 2018-07-16 MED ORDER — GADOBENATE DIMEGLUMINE 529 MG/ML IV SOLN
20.0000 mL | Freq: Once | INTRAVENOUS | Status: AC | PRN
  Administered 2018-07-16: 20 mL via INTRAVENOUS

## 2018-07-20 ENCOUNTER — Ambulatory Visit (INDEPENDENT_AMBULATORY_CARE_PROVIDER_SITE_OTHER): Payer: Medicare Other

## 2018-07-20 ENCOUNTER — Encounter: Payer: Self-pay | Admitting: Family Medicine

## 2018-07-20 ENCOUNTER — Ambulatory Visit (INDEPENDENT_AMBULATORY_CARE_PROVIDER_SITE_OTHER): Payer: Medicare Other | Admitting: Family Medicine

## 2018-07-20 ENCOUNTER — Other Ambulatory Visit: Payer: Self-pay | Admitting: Family Medicine

## 2018-07-20 VITALS — BP 152/98 | HR 80 | Temp 97.5°F | Ht 71.0 in | Wt 294.8 lb

## 2018-07-20 DIAGNOSIS — I5032 Chronic diastolic (congestive) heart failure: Secondary | ICD-10-CM | POA: Diagnosis not present

## 2018-07-20 DIAGNOSIS — I2782 Chronic pulmonary embolism: Secondary | ICD-10-CM

## 2018-07-20 DIAGNOSIS — I483 Typical atrial flutter: Secondary | ICD-10-CM

## 2018-07-20 DIAGNOSIS — R0989 Other specified symptoms and signs involving the circulatory and respiratory systems: Secondary | ICD-10-CM | POA: Diagnosis not present

## 2018-07-20 DIAGNOSIS — E890 Postprocedural hypothyroidism: Secondary | ICD-10-CM | POA: Diagnosis not present

## 2018-07-20 DIAGNOSIS — Z6841 Body Mass Index (BMI) 40.0 and over, adult: Secondary | ICD-10-CM

## 2018-07-20 DIAGNOSIS — R413 Other amnesia: Secondary | ICD-10-CM

## 2018-07-20 LAB — CBC
HCT: 47.5 % (ref 39.0–52.0)
Hemoglobin: 15.8 g/dL (ref 13.0–17.0)
MCHC: 33.2 g/dL (ref 30.0–36.0)
MCV: 89.3 fl (ref 78.0–100.0)
Platelets: 277 10*3/uL (ref 150.0–400.0)
RBC: 5.32 Mil/uL (ref 4.22–5.81)
RDW: 16.6 % — ABNORMAL HIGH (ref 11.5–15.5)
WBC: 9.3 10*3/uL (ref 4.0–10.5)

## 2018-07-20 LAB — COMPREHENSIVE METABOLIC PANEL
ALT: 29 U/L (ref 0–53)
AST: 22 U/L (ref 0–37)
Albumin: 3.7 g/dL (ref 3.5–5.2)
Alkaline Phosphatase: 80 U/L (ref 39–117)
BUN: 14 mg/dL (ref 6–23)
CO2: 33 mEq/L — ABNORMAL HIGH (ref 19–32)
Calcium: 9.1 mg/dL (ref 8.4–10.5)
Chloride: 98 mEq/L (ref 96–112)
Creatinine, Ser: 1.21 mg/dL (ref 0.40–1.50)
GFR: 58.29 mL/min — ABNORMAL LOW (ref >60.00)
GLUCOSE: 89 mg/dL (ref 70–99)
POTASSIUM: 4.3 meq/L (ref 3.5–5.1)
Sodium: 137 mEq/L (ref 135–145)
Total Bilirubin: 0.6 mg/dL (ref 0.2–1.2)
Total Protein: 7.1 g/dL (ref 6.0–8.3)

## 2018-07-20 LAB — BRAIN NATRIURETIC PEPTIDE: Pro B Natriuretic peptide (BNP): 96 pg/mL (ref 0.0–100.0)

## 2018-07-20 LAB — T3, FREE: T3, Free: 2.7 pg/mL (ref 2.3–4.2)

## 2018-07-20 LAB — T4, FREE: Free T4: 0.61 ng/dL (ref 0.60–1.60)

## 2018-07-20 NOTE — Progress Notes (Addendum)
Subjective:  DEQUAVIOUS Booth is a 78 y.o. year old very pleasant male patient who presents for/with See problem oriented charting ROS-complains of shortness of breath and fatigue.  Has had weight gain and increased edema.  Admits to higher salt diet.  No chest pain reported.  Past Medical History-  Patient Active Problem List   Diagnosis Date Noted  . Chronic respiratory failure with hypoxia (HCC) 04/26/2018    Priority: High  . CAD (coronary artery disease) 04/09/2018    Priority: High  . (HFpEF) heart failure with preserved ejection fraction ([place]) 12/16/2017    Priority: High  . Chest pain     Priority: High  . Atrial flutter ([place]) 11/01/2017    Priority: High  . Chronic pulmonary embolism ([place]) 10/23/2016    Priority: High  . Asthmatic bronchitis 06/09/2011    Priority: High  . History of prostate cancer-seed implantation 2010.  Dr. Alinda Booth 10/02/2008    Priority: High  . History of renal cell carcinoma 12/26/2007    Priority: High  . Memory loss 06/10/2018    Priority: Medium  . CKD (chronic kidney disease) stage 3, GFR 30-59 ml/min (HCC)     Priority: Medium  . HCAP (healthcare-associated pneumonia) 02/03/2018    Priority: Medium  . History of adenomatous polyp of colon 04/12/2017    Priority: Medium  . Cervical disc disease 11/13/2015    Priority: Medium  . Essential tremor 08/14/2015    Priority: Medium  . Gout 08/14/2015    Priority: Medium  . Hypothyroidism 07/12/2014    Priority: Medium  . Hyperglycemia 07/12/2014    Priority: Medium  . GAD (generalized anxiety disorder) 11/07/2013    Priority: Medium  . OSA (obstructive sleep apnea) 07/07/2011    Priority: Medium  . Morbid obesity ([place]) 10/02/2008    Priority: Medium  . Insomnia 12/26/2007    Priority: Medium  . Essential hypertension 12/28/2006    Priority: Medium  . Aortic atherosclerosis ([place]) 05/10/2018    Priority: Low  . Constipation 01/21/2018    Priority: Low  . Anticoagulated     Priority:  Low  . Bilateral foot pain 07/13/2017    Priority: Low  . Community acquired pneumonia 06/18/2017    Priority: Low  . Actinic keratosis 11/27/2009    Priority: Low  . Osteoarthritis 11/13/2008    Priority: Low  . TESTOSTERONE DEFICIENCY 12/28/2006    Priority: Low  . First degree AV block 04/15/2018  . Pleural effusion 04/14/2018  . Chronic pain 04/09/2018  . Labile blood pressure     Medications- reviewed and updated Current Outpatient Medications  Medication Sig Dispense Refill  . albuterol (PROVENTIL HFA;VENTOLIN HFA) 108 (90 Base) MCG/ACT inhaler Inhale 2 puffs into the lungs every 6 (six) hours as needed for wheezing or shortness of breath. 1 Inhaler 3  . allopurinol (ZYLOPRIM) 100 MG tablet Take 1 tablet (100 mg total) by mouth daily. 30 tablet 6  . apixaban (ELIQUIS) 5 MG TABS tablet Take 1 tablet (5 mg total) by mouth 2 (two) times daily. 60 tablet 11  . colchicine 0.6 MG tablet Take 1 tablet (0.6 mg total) by mouth 2 (two) times daily. 60 tablet 0  . cyclobenzaprine (FLEXERIL) 10 MG tablet Take 1 tablet (10 mg total) by mouth 3 (three) times daily as needed for muscle spasms. 30 tablet 0  . docusate sodium (COLACE) 100 MG capsule Take 1 capsule (100 mg total) by mouth daily. 10 capsule 0  . escitalopram (LEXAPRO) 20 MG tablet  Take 1 tablet (20 mg total) by mouth daily. 90 tablet 3  . furosemide (LASIX) 40 MG tablet Take 1 tablet (40 mg total) by mouth 2 (two) times daily. 30 tablet 0  . levothyroxine (SYNTHROID, LEVOTHROID) 150 MCG tablet Take 1 tablet (150 mcg total) by mouth daily. 90 tablet 2  . Multiple Vitamins-Minerals (MULTIVITAMINS THER. W/MINERALS) TABS Take 1 tablet by mouth daily.    . nortriptyline (PAMELOR) 75 MG capsule TAKE ONE CAPSULE BY MOUTH EVERY NIGHT AT BEDTIME 90 capsule 1  . polyethylene glycol (MIRALAX / GLYCOLAX) packet Take 17 g by mouth daily. 14 each 0  . potassium chloride SA (K-DUR,KLOR-CON) 20 MEQ tablet Take 1 tablet (20 mEq total) by mouth 2  (two) times daily. 60 tablet 0  . propranolol (INDERAL) 10 MG tablet TAKE ONE TABLET BY MOUTH TWICE A DAY 60 tablet 2  . traMADol (ULTRAM) 50 MG tablet Take 1 tablet (50 mg total) by mouth every 6 (six) hours as needed for moderate pain. 30 tablet 2  . traZODone (DESYREL) 50 MG tablet TAKE ONE-HALF TABLET BY MOUTH TO 1 TABLET BY MOUTH AT BEDTIME AS NEEDED SLEEP 90 tablet 0   No current facility-administered medications for this visit.     Objective: BP (!) 152/98 (BP Location: Left Arm, Patient Position: Sitting, Cuff Size: Large) Comment: Took BP med 20 minutes ago  Pulse 80   Temp (!) 97.5 F (36.4 C) (Oral)   Ht 5\' 11"  (1.803 m)   Wt 294 lb 12.8 oz (133.7 kg)   SpO2 99%   BMI 41.12 kg/m  Gen: NAD, resting comfortably CV: RRR no murmurs rubs or gallops Lungs: More prominent crackles at lung bases than previous.  No wheeze or rhonchi Abdomen: soft/nontender/nondistended/normal bowel sounds. No rebound or guarding.  Ext: 1+ edema Skin: warm, dry Neuro: grossly normal, moves all extremities  Assessment/Plan:  Other notes: 1.with CAD on CT scan aug 2019 and aortic atherosclerosis- consider updating lipid panel and statin. With memory loss reported- slightly hesitant   Atrial flutter  s: Patient with atrial flutter history with ablation back in August-he is on Eliquis for anticoagulation.  Heart rate has been controlled on propranolol A/P: Stable-continue current medications  Chronic pulmonary embolism S: Patient with history of pulmonary embolism- now on long-term Eliquis concurrently for atrial flutter  Pulmonary embolism was thought potentially provoked due to cast on hand A/P: Stable-continue Eliquis- doubt SOB related to PE history   BMI monitoring- elevated BMI noted-BMI over 35 with hypertension and CAD qualifies for morbid obesity: Body mass index is 41.12 kg/m. Encouraged need for healthy eating, regular exercise, weight loss-he knows to focus on these once he is  better from acute worsening of shortness of breath-in the meantime does need to restrict his salt and advised to cut down on fluid intake to at least 32 ounces  (HFpEF) heart failure with preserved ejection fraction (HCC) S: Patient with ongoing shortness of breath since getting out of hospital in [place] improved after hospitalization within syncope and has stable shortness of breath- though recent worsening see below.  He remains on Lasix twice a day at 40 mg.  I offered repeat chest x-ray at last visit but patient declined and wanted to see if he continued to improve further out from hospitalizations and illnesses.  He did have faint crackles at bilateral lung bases at that time  He later was evaluated for a respiratory illness the day after Christmas-due to severity of prior illnesses he was treated  with Augmentin and azithromycin.  BNP was not elevated and chest x-ray without acute cardiopulmonary disease at that time-he was treated primarily due to anxiety and severity of prior infections  Today, states shortness of breath has worsened some - for example short of breath just walking to the counter today. Denies swelling more in legs. Weight is up 12 lbs. Has been more fatigued since last visit. Not coughing like he was- respiratory illness appears to have improved  When takes lasix doesn't feel like he urinates a lot A/P: I suspect exacerbation with recent weight gain and shortness of breath- im concerned that heart failure may not be well controlled. Will update BNP as well - update chest x-ray today is consistent with this concern -80 mg Lasix in the morning and 40 mg in the afternoon for the next 5 days and follow-up next week -We discussed going to the emergency room if symptoms were to worsen  Hypothyroidism I suspect fatigue is related to heart failure but Mistakenly ordered TSH last visit- patient with history of pituitary adenoma resection thus needs T3 and T4. He agrees   Memory  loss S: Patient denies issues with his memory.  His wife last visit reported forgetfulness.  We did basic lab work as well as MRI of the brain- largely unrevealing.  His MMSE score was 25 out of 30.  He did have an elevated PHQ 9 of 9 despite being on Lexapro at 20 mg-patient attributed issues to sleep and fatigue.  Denied depressed mood  Today patient and wife deny any worsening.  We reviewed prior MRI which was reassuring A/P: Memory loss which may be mild cognitive impairment-offered referral to neurology but they declined-we will plan on 81-month repeat MMSE  1 week follow-up  Lab/Order associations: Postoperative hypothyroidism - Plan: T3, free, T4, free, T4, free, T3, free  Other chronic pulmonary embolism without acute cor pulmonale (HCC), Chronic  Chronic heart failure with preserved ejection fraction ([place]), Chronic - Plan: DG Chest 2 View, B Nat Peptide, CBC, Comprehensive metabolic panel, Comprehensive metabolic panel, CBC, B Nat Peptide  Morbid obesity ([place]), Chronic  Typical atrial flutter ([place]), Chronic  BMI 40.0-44.9, adult ([place])  Memory loss  Return precautions advised.  [name], MD

## 2018-07-20 NOTE — Assessment & Plan Note (Signed)
I suspect fatigue is related to heart failure but Mistakenly ordered TSH last visit- patient with history of pituitary adenoma resection thus needs T3 and T4. He agrees

## 2018-07-20 NOTE — Assessment & Plan Note (Signed)
S: Patient with ongoing shortness of breath since getting out of hospital in [place] improved after hospitalization within syncope and has stable shortness of breath- though recent worsening see below.  He remains on Lasix twice a day at 40 mg.  I offered repeat chest x-ray at last visit but patient declined and wanted to see if he continued to improve further out from hospitalizations and illnesses.  He did have faint crackles at bilateral lung bases at that time  He later was evaluated for a respiratory illness the day after Christmas-due to severity of prior illnesses he was treated with Augmentin and azithromycin.  BNP was not elevated and chest x-ray without acute cardiopulmonary disease at that time-he was treated primarily due to anxiety and severity of prior infections  Today, states shortness of breath has worsened some - for example short of breath just walking to the counter today. Denies swelling more in legs. Weight is up 12 lbs. Has been more fatigued since last visit. Not coughing like he was- respiratory illness appears to have improved  When takes lasix doesn't feel like he urinates a lot A/P: I suspect exacerbation with recent weight gain and shortness of breath- im concerned that heart failure may not be well controlled. Will update BNP as well - update chest x-ray today is consistent with this concern -80 mg Lasix in the morning and 40 mg in the afternoon for the next 5 days and follow-up next week -We discussed going to the emergency room if symptoms were to worsen

## 2018-07-20 NOTE — Patient Instructions (Addendum)
Please stop by lab before you go-they will also do an x-ray  Take an extra Lasix in the morning-so 80 mg each morning and then 40 mg in the evening for 5 days  I think your blood pressure is also up due to excess fluid  Lets schedule a visit next week to check back-schedule this before you leave  I want you to watch the salt in your diet.  Lets also limit your fluids to perhaps 32 ounces a day.  Also need to improve dietary choices and once you get to feeling better-we need to get you back on an exercise regimen

## 2018-07-20 NOTE — Assessment & Plan Note (Signed)
S: Patient denies issues with his memory.  His wife last visit reported forgetfulness.  We did basic lab work as well as MRI of the brain- largely unrevealing.  His MMSE score was 25 out of 30.  He did have an elevated PHQ 9 of 9 despite being on Lexapro at 20 mg-patient attributed issues to sleep and fatigue.  Denied depressed mood  Today patient and wife deny any worsening.  We reviewed prior MRI which was reassuring A/P: Memory loss which may be mild cognitive impairment-offered referral to neurology but they declined-we will plan on 27-month repeat MMSE

## 2018-07-22 ENCOUNTER — Ambulatory Visit: Payer: Medicare Other | Admitting: Adult Health

## 2018-07-27 ENCOUNTER — Encounter: Payer: Self-pay | Admitting: Family Medicine

## 2018-07-27 ENCOUNTER — Ambulatory Visit (INDEPENDENT_AMBULATORY_CARE_PROVIDER_SITE_OTHER): Payer: Medicare Other | Admitting: Family Medicine

## 2018-07-27 VITALS — BP 112/88 | HR 83 | Temp 97.4°F | Ht 71.0 in | Wt 290.2 lb

## 2018-07-27 DIAGNOSIS — I5032 Chronic diastolic (congestive) heart failure: Secondary | ICD-10-CM

## 2018-07-27 DIAGNOSIS — F4310 Post-traumatic stress disorder, unspecified: Secondary | ICD-10-CM | POA: Diagnosis not present

## 2018-07-27 DIAGNOSIS — I10 Essential (primary) hypertension: Secondary | ICD-10-CM

## 2018-07-27 LAB — BASIC METABOLIC PANEL
BUN: 12 mg/dL (ref 6–23)
CO2: 30 mEq/L (ref 19–32)
Calcium: 9.1 mg/dL (ref 8.4–10.5)
Chloride: 100 mEq/L (ref 96–112)
Creatinine, Ser: 1.03 mg/dL (ref 0.40–1.50)
GFR: 70.2 mL/min (ref >60.00)
Glucose, Bld: 87 mg/dL (ref 70–99)
Potassium: 4.3 mEq/L (ref 3.5–5.1)
Sodium: 136 mEq/L (ref 135–145)

## 2018-07-27 MED ORDER — FUROSEMIDE 40 MG PO TABS: 40.0000 mg | ORAL_TABLET | Freq: Two times a day (BID) | ORAL | 5 refills | Status: DC

## 2018-07-27 MED ORDER — BUSPIRONE HCL 5 MG PO TABS: 5.0000 mg | ORAL_TABLET | Freq: Two times a day (BID) | ORAL | 5 refills | Status: DC | PRN

## 2018-07-27 NOTE — Assessment & Plan Note (Signed)
S: Last week patient reported increased fatigue, shortness of breath, swelling in legs.  Chest x-ray showed some pulmonary vascular congestion-we had him take 80 mg of Lasix in the morning and 40 mg in the afternoon for 5 days. Usually just takes 40mg  BID  With this adjustment his weight is down 4 pounds  Still drinking more than he should. Still feels tired and winded with exercise. Tries to eat low salt Wt Readings from Last 3 Encounters:  07/27/18 290 lb 3.2 oz (131.6 kg)  07/20/18 294 lb 12.8 oz (133.7 kg)  06/23/18 282 lb 12.8 oz (128.3 kg)   A/P: CHF - still fluid overloaded. 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 We will need to update bloodwork at that time  Continue potassium 20 meq twice a day- that is the one thing that could go low while on the lasix  Limit fluid intake to 40 oz per day  Try to limit sodium to 1500mg  per day if at all possible  Trial buspirone for anxiety  Lets do a 2 week follow up- you are doing better but I want to see if we can go back down to prior dose of lasix"

## 2018-07-27 NOTE — Progress Notes (Signed)
Phone [phone]   Subjective:  Wayne Booth is a 78 y.o. year old very pleasant male patient who presents for/with See problem oriented charting ROS- still with SOB and fatigue. No fever or chills. Has lost some weight   BMI monitoring- elevated BMI noted: Body mass index is 40.47 kg/m. Encouraged need for healthy eating, regular exercise, weight loss.   BMI Metric Follow Up - 07/20/18 1343      BMI Metric Follow Up-Please document annually   BMI Metric Follow Up  Education provided        Past Medical History-  Patient Active Problem List   Diagnosis Date Noted  . Memory loss 06/10/2018    Priority: High  . CAD (coronary artery disease) 04/09/2018    Priority: High  . (HFpEF) heart failure with preserved ejection fraction ([place]) 12/16/2017    Priority: High  . Chest pain     Priority: High  . Atrial flutter ([place]) 11/01/2017    Priority: High  . Chronic pulmonary embolism ([place]) 10/23/2016    Priority: High  . Asthmatic bronchitis 06/09/2011    Priority: High  . History of prostate cancer-seed implantation 2010.  Dr. Alinda Money 10/02/2008    Priority: High  . History of renal cell carcinoma 12/26/2007    Priority: High  . PTSD (post-traumatic stress disorder) 07/27/2018    Priority: Medium  . CKD (chronic kidney disease) stage 3, GFR 30-59 ml/min (HCC)     Priority: Medium  . HCAP (healthcare-associated pneumonia) 02/03/2018    Priority: Medium  . History of adenomatous polyp of colon 04/12/2017    Priority: Medium  . Cervical disc disease 11/13/2015    Priority: Medium  . Essential tremor 08/14/2015    Priority: Medium  . Gout 08/14/2015    Priority: Medium  . Hypothyroidism 07/12/2014    Priority: Medium  . Hyperglycemia 07/12/2014    Priority: Medium  . GAD (generalized anxiety disorder) 11/07/2013    Priority: Medium  . OSA (obstructive sleep apnea) 07/07/2011    Priority: Medium  . Morbid obesity ([place]) 10/02/2008    Priority: Medium  . Insomnia  12/26/2007    Priority: Medium  . Essential hypertension 12/28/2006    Priority: Medium  . Aortic atherosclerosis ([place]) 05/10/2018    Priority: Low  . Constipation 01/21/2018    Priority: Low  . Anticoagulated     Priority: Low  . Bilateral foot pain 07/13/2017    Priority: Low  . Community acquired pneumonia 06/18/2017    Priority: Low  . Actinic keratosis 11/27/2009    Priority: Low  . Osteoarthritis 11/13/2008    Priority: Low  . TESTOSTERONE DEFICIENCY 12/28/2006    Priority: Low  . First degree AV block 04/15/2018  . Pleural effusion 04/14/2018  . Chronic pain 04/09/2018  . Labile blood pressure     Medications- reviewed and updated Current Outpatient Medications  Medication Sig Dispense Refill  . albuterol (PROVENTIL HFA;VENTOLIN HFA) 108 (90 Base) MCG/ACT inhaler Inhale 2 puffs into the lungs every 6 (six) hours as needed for wheezing or shortness of breath. 1 Inhaler 3  . allopurinol (ZYLOPRIM) 100 MG tablet Take 1 tablet (100 mg total) by mouth daily. 30 tablet 6  . apixaban (ELIQUIS) 5 MG TABS tablet Take 1 tablet (5 mg total) by mouth 2 (two) times daily. 60 tablet 11  . colchicine 0.6 MG tablet Take 1 tablet (0.6 mg total) by mouth 2 (two) times daily. 60 tablet 0  . cyclobenzaprine (FLEXERIL) 10 MG tablet  Take 1 tablet (10 mg total) by mouth 3 (three) times daily as needed for muscle spasms. 30 tablet 0  . docusate sodium (COLACE) 100 MG capsule Take 1 capsule (100 mg total) by mouth daily. 10 capsule 0  . escitalopram (LEXAPRO) 20 MG tablet Take 1 tablet (20 mg total) by mouth daily. 90 tablet 3  . furosemide (LASIX) 40 MG tablet Take 1-2 tablets (40-80 mg total) by mouth 2 (two) times daily. 120 tablet 5  . levothyroxine (SYNTHROID, LEVOTHROID) 150 MCG tablet TAKE ONE TABLET BY MOUTH DAILY 90 tablet 1  . Multiple Vitamins-Minerals (MULTIVITAMINS THER. W/MINERALS) TABS Take 1 tablet by mouth daily.    . nortriptyline (PAMELOR) 75 MG capsule TAKE ONE CAPSULE BY  MOUTH EVERY NIGHT AT BEDTIME 90 capsule 1  . polyethylene glycol (MIRALAX / GLYCOLAX) packet Take 17 g by mouth daily. 14 each 0  . potassium chloride SA (K-DUR,KLOR-CON) 20 MEQ tablet Take 1 tablet (20 mEq total) by mouth 2 (two) times daily. 60 tablet 0  . propranolol (INDERAL) 10 MG tablet TAKE ONE TABLET BY MOUTH TWICE A DAY 60 tablet 2  . traMADol (ULTRAM) 50 MG tablet Take 1 tablet (50 mg total) by mouth every 6 (six) hours as needed for moderate pain. 30 tablet 2  . traZODone (DESYREL) 50 MG tablet TAKE ONE-HALF TABLET BY MOUTH TO 1 TABLET BY MOUTH AT BEDTIME AS NEEDED SLEEP 90 tablet 0  . busPIRone (BUSPAR) 5 MG tablet Take 1 tablet (5 mg total) by mouth 2 (two) times daily as needed (anxiety). 30 tablet 5   No current facility-administered medications for this visit.      Objective:  BP 112/88 (BP Location: Left Arm, Patient Position: Sitting, Cuff Size: Large)   Pulse 83   Temp (!) 97.4 F (36.3 C) (Oral)   Ht 5\' 11"  (1.803 m)   Wt 290 lb 3.2 oz (131.6 kg)   SpO2 93%   BMI 40.47 kg/m  Gen: NAD, resting comfortably CV: RRR no murmurs rubs or gallops Lungs: CTAB no crackles, wheeze, rhonchi Abdomen: soft/nontender/nondistended/normal bowel sounds. No rebound or guarding.  Ext: 1+ edema Skin: warm, dry Does repeat what I have said to reimphasize     Assessment and Plan   (HFpEF) heart failure with preserved ejection fraction (HCC) S: Last week patient reported increased fatigue, shortness of breath, swelling in legs.  Chest x-ray showed some pulmonary vascular congestion-we had him take 80 mg of Lasix in the morning and 40 mg in the afternoon for 5 days. Usually just takes 40mg  BID  With this adjustment his weight is down 4 pounds  Still drinking more than he should. Still feels tired and winded with exercise. Tries to eat low salt Wt Readings from Last 3 Encounters:  07/27/18 290 lb 3.2 oz (131.6 kg)  07/20/18 294 lb 12.8 oz (133.7 kg)  06/23/18 282 lb 12.8 oz (128.3  kg)   A/P: CHF - still fluid overloaded. 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 We will need to update bloodwork at that time  Continue potassium 20 meq twice a day- that is the one thing that could go low while on the lasix  Limit fluid intake to 40 oz per day  Try to limit sodium to 1500mg  per day if at all possible  Trial  buspirone for anxiety  Lets do a 2 week follow up- you are doing better but I want to see if we can go back down to prior dose of lasix"  PTSD (post-traumatic stress disorder) S: every since his hospitalization he can have periods of anxiety. Gets really jittery at times like he waats to come out of his skin. Gets flashbacks to the hospital in these instances. For a while didn't want to go to sleep worrie dhe wouldn't wake up- that's now better.   Already on lexapro A/P: suspect mild ptsd and associated anxiety after being severely ill and near death- we will trial sparing buspirone on top of lexapro 20mg   # hypertension S:controlled on lasix and propranolol- hopeful improves with more diuresis in regards to diastolic  A/P: also discussed limiting salt intake in regards to CHF. Stable but hoping for further improvement   Future Appointments  Date Time Provider [place]  08/10/2018  8:00 AM Marin Olp, MD [facility]   Lab/Order associations: Chronic heart failure with preserved ejection fraction ([place]) - Plan: Basic metabolic panel  PTSD (post-traumatic stress disorder)  Essential hypertension  Meds ordered this encounter  Medications  . furosemide (LASIX) 40 MG tablet    Sig: Take 1-2 tablets (40-80 mg total) by mouth 2 (two) times daily.    Dispense:  120 tablet    Refill:  5  . busPIRone (BUSPAR) 5 MG tablet    Sig: Take 1 tablet (5 mg total) by mouth 2 (two) times daily as  needed (anxiety).    Dispense:  30 tablet    Refill:  5    Return precautions advised.  [name], MD

## 2018-07-27 NOTE — Patient Instructions (Addendum)
Take lasix 40mg - 2 tablets each morning and 1 tablet six hours later for the next 2 weeks, then see me back so we can reassess. We will need to update bloodwork at that time  Continue potassium 20 meq twice a day- that is the one thing that could go low while on the lasix  Limit fluid intake to 40 oz per day  Try to limit sodium to 1500mg  per day if at all possible  Trial buspirone for anxiety  Lets do a 2 week follow up- you are doing better but I want to see if we can go back down to prior dose of lasix  Please stop by lab before you go If you do not have mychart- we will call you about results within 5 business days of [place] receiving them.  If you have mychart- we will send your results within 3 business days of [place] receiving them.  If abnormal or we want to clarify a result, we will call or mychart you to make sure you receive the message.  If you have questions or concerns or don't hear within 5-7 days, please send [place] a message or call us.

## 2018-07-27 NOTE — Assessment & Plan Note (Signed)
S: every since his hospitalization he can have periods of anxiety. Gets really jittery at times like he waats to come out of his skin. Gets flashbacks to the hospital in these instances. For a while didn't want to go to sleep worrie dhe wouldn't wake up- that's now better.   Already on lexapro A/P: suspect mild ptsd and associated anxiety after being severely ill and near death- we will trial sparing buspirone on top of lexapro 20mg 

## 2018-08-01 ENCOUNTER — Other Ambulatory Visit: Payer: Self-pay

## 2018-08-01 MED ORDER — POTASSIUM CHLORIDE CRYS ER 20 MEQ PO TBCR: 20.0000 meq | EXTENDED_RELEASE_TABLET | Freq: Two times a day (BID) | ORAL | 2 refills | Status: DC

## 2018-08-04 DIAGNOSIS — G4733 Obstructive sleep apnea (adult) (pediatric): Secondary | ICD-10-CM | POA: Diagnosis not present

## 2018-08-10 ENCOUNTER — Ambulatory Visit (INDEPENDENT_AMBULATORY_CARE_PROVIDER_SITE_OTHER): Payer: Medicare Other | Admitting: Family Medicine

## 2018-08-10 ENCOUNTER — Encounter: Payer: Self-pay | Admitting: Family Medicine

## 2018-08-10 VITALS — BP 130/70 | HR 83 | Temp 98.4°F | Ht 71.0 in | Wt 284.1 lb

## 2018-08-10 DIAGNOSIS — I5032 Chronic diastolic (congestive) heart failure: Secondary | ICD-10-CM | POA: Diagnosis not present

## 2018-08-10 DIAGNOSIS — I10 Essential (primary) hypertension: Secondary | ICD-10-CM | POA: Diagnosis not present

## 2018-08-10 DIAGNOSIS — F411 Generalized anxiety disorder: Secondary | ICD-10-CM | POA: Diagnosis not present

## 2018-08-10 LAB — BASIC METABOLIC PANEL
BUN: 15 mg/dL (ref 6–23)
CO2: 30 mEq/L (ref 19–32)
Calcium: 9.2 mg/dL (ref 8.4–10.5)
Chloride: 99 mEq/L (ref 96–112)
Creatinine, Ser: 1.24 mg/dL (ref 0.40–1.50)
GFR: 56.66 mL/min — ABNORMAL LOW (ref >60.00)
Glucose, Bld: 100 mg/dL — ABNORMAL HIGH (ref 70–99)
Potassium: 4.1 mEq/L (ref 3.5–5.1)
Sodium: 136 mEq/L (ref 135–145)

## 2018-08-10 NOTE — Assessment & Plan Note (Addendum)
S: Patient also dealing with some anxiety after his near death experience- likely mild PTSD and anxiety associated with that.  He was already on Lexapro 20 mg.  We opted to trial buspirone at low-dose.  Patient reports takes it sparingly but does get some good relief from it.   A/P: Patient doing well with sparing buspirone in addition to Lexapro.  Continue current medication -He is also on nortriptyline and tramadol-I do want to go over serotonin syndrome risk again next visit

## 2018-08-10 NOTE — Patient Instructions (Addendum)
Since he is doing better- we opted to go back to 1 lasix 40mg  in the morning and 1 six hours later. If he has more than 3  Lbs in a day or two or 5 lbs over 3-5 days or notices swelling in the legs- go back up to two in the morning and one six hours later and let me know  Please stop by lab before you go If you do not have mychart- we will call you about results within 5 business days of [place] receiving them.  If you have mychart- we will send your results within 3 business days of [place] receiving them.  If abnormal or we want to clarify a result, we will call or mychart you to make sure you receive the message.  If you have questions or concerns or don't hear within 5-7 days, please send [place] a message or call us.

## 2018-08-10 NOTE — Assessment & Plan Note (Signed)
S: Patient was seen on January 29-still appeared to be fluid overloaded- he was feeling fatigued and winded with minimal exercise at that time.  We placed him on 80 mg Lasix in the morning and 40 mg of Lasix in the afternoon for 2 weeks.  We had him on potassium 20 mEq twice a day.  Tried to limit fluid intake to 40 ounces per day.  Try to limit sodium to 1500 mg/day.  Down another 6 lbs this visit. He states shortness of breath has improved and can walk into the office without difficulty now- still gets winded if really pushes himself or walks long distances. Swelling in legs better.  A/P: Since he is doing better- we opted to go back to 1 lasix 40mg  in the morning and 1 six hours later. If he has more than 3  Lbs in a day or two or 5 lbs over 3-5 days or notices swelling in the legs- go back up to two in the morning and one six hours later and let me know -Given higher dose Lasix over last 2 weeks with unilateral kidney we will recheck BMP

## 2018-08-10 NOTE — Progress Notes (Signed)
Phone [phone]   Subjective:  Wayne Booth is a 78 y.o. year old very pleasant male patient who presents for/with See problem oriented charting ROS-no chest pain reported.  Edema has improved.  Shortness of breath has improved.  Anxiety is better on low-dose buspirone as needed  Past Medical History-  Patient Active Problem List   Diagnosis Date Noted  . Memory loss 06/10/2018    Priority: High  . CAD (coronary artery disease) 04/09/2018    Priority: High  . (HFpEF) heart failure with preserved ejection fraction ([place]) 12/16/2017    Priority: High  . Chest pain     Priority: High  . Atrial flutter ([place]) 11/01/2017    Priority: High  . Chronic pulmonary embolism ([place]) 10/23/2016    Priority: High  . Asthmatic bronchitis 06/09/2011    Priority: High  . History of prostate cancer-seed implantation 2010.  Dr. Alinda Money 10/02/2008    Priority: High  . History of renal cell carcinoma 12/26/2007    Priority: High  . PTSD (post-traumatic stress disorder) 07/27/2018    Priority: Medium  . CKD (chronic kidney disease) stage 3, GFR 30-59 ml/min (HCC)     Priority: Medium  . HCAP (healthcare-associated pneumonia) 02/03/2018    Priority: Medium  . History of adenomatous polyp of colon 04/12/2017    Priority: Medium  . Cervical disc disease 11/13/2015    Priority: Medium  . Essential tremor 08/14/2015    Priority: Medium  . Gout 08/14/2015    Priority: Medium  . Hypothyroidism 07/12/2014    Priority: Medium  . Hyperglycemia 07/12/2014    Priority: Medium  . GAD (generalized anxiety disorder) 11/07/2013    Priority: Medium  . OSA (obstructive sleep apnea) 07/07/2011    Priority: Medium  . Morbid obesity ([place]) 10/02/2008    Priority: Medium  . Insomnia 12/26/2007    Priority: Medium  . Essential hypertension 12/28/2006    Priority: Medium  . Aortic atherosclerosis ([place]) 05/10/2018    Priority: Low  . Constipation 01/21/2018    Priority: Low  . Anticoagulated    Priority: Low  . Bilateral foot pain 07/13/2017    Priority: Low  . Community acquired pneumonia 06/18/2017    Priority: Low  . Actinic keratosis 11/27/2009    Priority: Low  . Osteoarthritis 11/13/2008    Priority: Low  . TESTOSTERONE DEFICIENCY 12/28/2006    Priority: Low  . First degree AV block 04/15/2018  . Pleural effusion 04/14/2018  . Chronic pain 04/09/2018  . Labile blood pressure   . Osteoarthritis of left hip 07/21/2017  . Pain in left knee 07/15/2017  . Acute bronchitis due to Rhinovirus 06/09/2017  . 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 VTE (venous thromboembolism) 06/09/2017  . Bronchopneumonia, unspecified organism 06/09/2017  . Pneumonia due to infectious organism 06/08/2017  . History of total knee replacement, right 12/17/2008  . History of total knee replacement, left 10/28/2006    Medications- reviewed and updated Current Outpatient Medications  Medication Sig Dispense Refill  . albuterol (PROVENTIL HFA;VENTOLIN HFA) 108 (90 Base) MCG/ACT inhaler Inhale 2 puffs into the lungs every 6 (six) hours as needed for wheezing or shortness of breath. 1 Inhaler 3  . allopurinol (ZYLOPRIM) 100 MG tablet Take 1 tablet (100 mg total) by mouth daily. 30 tablet 6  . apixaban (ELIQUIS) 5 MG TABS tablet Take 1 tablet (5 mg total) by mouth 2 (two) times daily. 60 tablet 11  . busPIRone (BUSPAR) 5 MG tablet Take 1 tablet (5 mg  total) by mouth 2 (two) times daily as needed (anxiety). 30 tablet 5  . colchicine 0.6 MG tablet Take 1 tablet (0.6 mg total) by mouth 2 (two) times daily. 60 tablet 0  . cyclobenzaprine (FLEXERIL) 10 MG tablet Take 1 tablet (10 mg total) by mouth 3 (three) times daily as needed for muscle spasms. 30 tablet 0  . docusate sodium (COLACE) 100 MG capsule Take 1 capsule (100 mg total) by mouth daily. 10 capsule 0  . escitalopram (LEXAPRO) 20 MG tablet Take 1 tablet (20 mg total) by mouth daily. 90 tablet 3  . furosemide (LASIX) 40 MG tablet Take 1-2 tablets  (40-80 mg total) by mouth 2 (two) times daily. 120 tablet 5  . levothyroxine (SYNTHROID, LEVOTHROID) 150 MCG tablet TAKE ONE TABLET BY MOUTH DAILY 90 tablet 1  . Multiple Vitamins-Minerals (MULTIVITAMINS THER. W/MINERALS) TABS Take 1 tablet by mouth daily.    . nortriptyline (PAMELOR) 75 MG capsule TAKE ONE CAPSULE BY MOUTH EVERY NIGHT AT BEDTIME 90 capsule 1  . polyethylene glycol (MIRALAX / GLYCOLAX) packet Take 17 g by mouth daily. 14 each 0  . potassium chloride SA (K-DUR,KLOR-CON) 20 MEQ tablet Take 1 tablet (20 mEq total) by mouth 2 (two) times daily. 60 tablet 2  . propranolol (INDERAL) 10 MG tablet TAKE ONE TABLET BY MOUTH TWICE A DAY 60 tablet 2  . traMADol (ULTRAM) 50 MG tablet Take 1 tablet (50 mg total) by mouth every 6 (six) hours as needed for moderate pain. 30 tablet 2  . traZODone (DESYREL) 50 MG tablet TAKE ONE-HALF TABLET BY MOUTH TO 1 TABLET BY MOUTH AT BEDTIME AS NEEDED SLEEP 90 tablet 0   No current facility-administered medications for this visit.      Objective:  BP 130/70 (BP Location: Left Arm, Patient Position: Sitting, Cuff Size: Large)   Pulse 83   Temp 98.4 F (36.9 C) (Oral)   Ht 5\' 11"  (1.803 m)   Wt 284 lb 1.6 oz (128.9 kg)   SpO2 95%   BMI 39.62 kg/m  Gen: NAD, resting comfortably CV: RRR no murmurs rubs or gallops Lungs: CTAB no crackles, wheeze, rhonchi Abdomen: soft/obese/nondistended Ext: trace pitting edema Skin: warm, dry    Assessment and Plan   #Hypertension S: Compliant with Lasix at above doses and propranolol.  Diastolic reading was high normal at last visit-today blood pressure is improved in regard to diastolic number A/P: Stable-continue current medication but reduce Lasix to 40 mg twice a day from 80 in the morning and 40 mg later in the day.  Continue propranolol   (HFpEF) heart failure with preserved ejection fraction ([place]) S: Patient was seen on January 29-still appeared to be fluid overloaded- he was feeling fatigued and winded  with minimal exercise at that time.  We placed him on 80 mg Lasix in the morning and 40 mg of Lasix in the afternoon for 2 weeks.  We had him on potassium 20 mEq twice a day.  Tried to limit fluid intake to 40 ounces per day.  Try to limit sodium to 1500 mg/day.  Down another 6 lbs this visit. He states shortness of breath has improved and can walk into the office without difficulty now- still gets winded if really pushes himself or walks long distances. Swelling in legs better.  A/P: Since he is doing better- we opted to go back to 1 lasix 40mg  in the morning and 1 six hours later. If he has more than 3  Lbs in  a day or two or 5 lbs over 3-5 days or notices swelling in the legs- go back up to two in the morning and one six hours later and let me know -Given higher dose Lasix over last 2 weeks with unilateral kidney we will recheck BMP  GAD (generalized anxiety disorder) S: Patient also dealing with some anxiety after his near death experience- likely mild PTSD and anxiety associated with that.  He was already on Lexapro 20 mg.  We opted to trial buspirone at low-dose.  Patient reports takes it sparingly but does get some good relief from it.   A/P: Patient doing well with sparing buspirone in addition to Lexapro.  Continue current medication -He is also on nortriptyline and tramadol-I do want to go over serotonin syndrome risk again next visit  Return in about 2 months (around 10/09/2018) for follow up- or sooner if needed.  Lab/Order associations: Chronic heart failure with preserved ejection fraction ([place]) - Plan: Basic metabolic panel  GAD (generalized anxiety disorder)  Return precautions advised.  [name], MD

## 2018-08-14 DIAGNOSIS — G4733 Obstructive sleep apnea (adult) (pediatric): Secondary | ICD-10-CM | POA: Diagnosis not present

## 2018-08-16 ENCOUNTER — Telehealth: Payer: Self-pay | Admitting: Family Medicine

## 2018-08-16 NOTE — Telephone Encounter (Signed)
See note  Copied from [place] [phone]. Topic: General - Other >> Aug 15, 2018  4:39 PM Bea Graff, NT wrote: Reason for CRM: Pt returning call to [facility]. Please advise. >> Aug 15, 2018  5:38 PM Rutherford Nail, NT wrote: Patient retuning another call to [facility]. States that he will try and reach back out in the morning.

## 2018-08-16 NOTE — Telephone Encounter (Signed)
Spoke to pt see lab result notes

## 2018-09-02 ENCOUNTER — Ambulatory Visit (INDEPENDENT_AMBULATORY_CARE_PROVIDER_SITE_OTHER): Payer: Medicare Other | Admitting: Family Medicine

## 2018-09-02 ENCOUNTER — Encounter: Payer: Self-pay | Admitting: Family Medicine

## 2018-09-02 VITALS — BP 126/84 | HR 87 | Temp 97.5°F | Ht 71.0 in | Wt 288.4 lb

## 2018-09-02 DIAGNOSIS — E890 Postprocedural hypothyroidism: Secondary | ICD-10-CM | POA: Diagnosis not present

## 2018-09-02 DIAGNOSIS — G4733 Obstructive sleep apnea (adult) (pediatric): Secondary | ICD-10-CM | POA: Diagnosis not present

## 2018-09-02 DIAGNOSIS — R195 Other fecal abnormalities: Secondary | ICD-10-CM

## 2018-09-02 NOTE — Progress Notes (Signed)
Phone [phone]   Subjective:  Wayne Booth is a 78 y.o. year old very pleasant male patient who presents for/with See problem oriented charting ROS-no nausea, vomiting, fever, chills.  No chest pain reported.  No increased edema reported.  Complains of loose stools perhaps 1-2 times a day  Past Medical History-  Patient Active Problem List   Diagnosis Date Noted  . Memory loss 06/10/2018    Priority: High  . CAD (coronary artery disease) 04/09/2018    Priority: High  . CKD (chronic kidney disease) stage 3, GFR 30-59 ml/min (HCC)     Priority: High  . (HFpEF) heart failure with preserved ejection fraction ([place]) 12/16/2017    Priority: High  . Chest pain     Priority: High  . Atrial flutter ([place]) 11/01/2017    Priority: High  . Chronic pulmonary embolism ([place]) 10/23/2016    Priority: High  . Asthmatic bronchitis 06/09/2011    Priority: High  . History of prostate cancer-seed implantation 2010.  Dr. Alinda Money 10/02/2008    Priority: High  . History of renal cell carcinoma 12/26/2007    Priority: High  . PTSD (post-traumatic stress disorder) 07/27/2018    Priority: Medium  . HCAP (healthcare-associated pneumonia) 02/03/2018    Priority: Medium  . History of adenomatous polyp of colon 04/12/2017    Priority: Medium  . Cervical disc disease 11/13/2015    Priority: Medium  . Essential tremor 08/14/2015    Priority: Medium  . Gout 08/14/2015    Priority: Medium  . Hypothyroidism 07/12/2014    Priority: Medium  . Hyperglycemia 07/12/2014    Priority: Medium  . GAD (generalized anxiety disorder) 11/07/2013    Priority: Medium  . OSA (obstructive sleep apnea) 07/07/2011    Priority: Medium  . Morbid obesity ([place]) 10/02/2008    Priority: Medium  . Insomnia 12/26/2007    Priority: Medium  . Essential hypertension 12/28/2006    Priority: Medium  . Aortic atherosclerosis ([place]) 05/10/2018    Priority: Low  . Constipation 01/21/2018    Priority: Low  . Anticoagulated       Priority: Low  . Bilateral foot pain 07/13/2017    Priority: Low  . Community acquired pneumonia 06/18/2017    Priority: Low  . Status post nephrectomy 06/09/2017    Priority: Low  . Actinic keratosis 11/27/2009    Priority: Low  . Osteoarthritis 11/13/2008    Priority: Low  . TESTOSTERONE DEFICIENCY 12/28/2006    Priority: Low  . First degree AV block 04/15/2018  . Pleural effusion 04/14/2018  . Chronic pain 04/09/2018  . Pain in left knee 07/15/2017  . VTE (venous thromboembolism) 06/09/2017    Medications- reviewed and updated Current Outpatient Medications  Medication Sig Dispense Refill  . albuterol (PROVENTIL HFA;VENTOLIN HFA) 108 (90 Base) MCG/ACT inhaler Inhale 2 puffs into the lungs every 6 (six) hours as needed for wheezing or shortness of breath. 1 Inhaler 3  . allopurinol (ZYLOPRIM) 100 MG tablet Take 1 tablet (100 mg total) by mouth daily. 30 tablet 6  . apixaban (ELIQUIS) 5 MG TABS tablet Take 1 tablet (5 mg total) by mouth 2 (two) times daily. 60 tablet 11  . busPIRone (BUSPAR) 5 MG tablet Take 1 tablet (5 mg total) by mouth 2 (two) times daily as needed (anxiety). 30 tablet 5  . colchicine 0.6 MG tablet Take 1 tablet (0.6 mg total) by mouth 2 (two) times daily. 60 tablet 0  . cyclobenzaprine (FLEXERIL) 10 MG tablet Take 1  tablet (10 mg total) by mouth 3 (three) times daily as needed for muscle spasms. 30 tablet 0  . escitalopram (LEXAPRO) 20 MG tablet Take 1 tablet (20 mg total) by mouth daily. 90 tablet 3  . furosemide (LASIX) 40 MG tablet Take 1-2 tablets (40-80 mg total) by mouth 2 (two) times daily. 120 tablet 5  . levothyroxine (SYNTHROID, LEVOTHROID) 150 MCG tablet TAKE ONE TABLET BY MOUTH DAILY 90 tablet 1  . Multiple Vitamins-Minerals (MULTIVITAMINS THER. W/MINERALS) TABS Take 1 tablet by mouth daily.    . nortriptyline (PAMELOR) 75 MG capsule TAKE ONE CAPSULE BY MOUTH EVERY NIGHT AT BEDTIME 90 capsule 1  . potassium chloride SA (K-DUR,KLOR-CON) 20 MEQ  tablet Take 1 tablet (20 mEq total) by mouth 2 (two) times daily. 60 tablet 2  . propranolol (INDERAL) 10 MG tablet TAKE ONE TABLET BY MOUTH TWICE A DAY 60 tablet 2  . traMADol (ULTRAM) 50 MG tablet Take 1 tablet (50 mg total) by mouth every 6 (six) hours as needed for moderate pain. 30 tablet 2  . traZODone (DESYREL) 50 MG tablet TAKE ONE-HALF TABLET BY MOUTH TO 1 TABLET BY MOUTH AT BEDTIME AS NEEDED SLEEP 90 tablet 0   No current facility-administered medications for this visit.      Objective:  BP 126/84 (BP Location: Left Arm, Patient Position: Sitting, Cuff Size: Normal)   Pulse 87   Temp (!) 97.5 F (36.4 C) (Oral)   Ht 5\' 11"  (1.803 m)   Wt 288 lb 6.4 oz (130.8 kg)   SpO2 94%   BMI 40.22 kg/m  Gen: NAD, resting comfortably CV: RRR no murmurs rubs or gallops Lungs: CTAB no crackles, wheeze, rhonchi Abdomen: soft/nontender/nondistended/normal bowel sounds. No rebound or guarding.  Protuberant abdomen per baseline Ext: Stable trace edema Skin: warm, dry Neuro: Normal gait and speech.  Asks me to write things down to help him remember what we spoke about today    Assessment and Plan    Loose stools  Postoperative hypothyroidism  S: symptoms started a few weeks ago. Has noted diarrhea and then seems to start to clear up then worsens again. Has had several rounds of this fluctuation. Most of the time only getting one episode of diarrhea. Loose stool but not watery. No blood or melena. Does not feel bad overall. Still taking lasix 40mg  twice a day.   States even when this started was just once a day. No change in diet during this time frame. No nausea or vomiting.  A/P: Loose stools- doubt significant viral gastroenteritis given only 1-2 loose stools per day which is not a drastic increase in frequency for him-simply change in consistency of stools.  No blood or melena in the stools.  Conservative care as below-wonder about gut flora changes as primary cause From AVS "  Patient  Instructions  You may have picked up a virus but typically these dont last more than 10 days and typically cause more loose stools then what you are having.   The more likely thing is your gut flora/bacteria in your stomach is just off. Let's have you eat at least 1 activia yogurt a day for the next 2-3 weeks to see if that helps.   If you continue to have loose stools- we could do a stool study to rule out any bacterial infection. Message me in 2 weeks if not improving.   Glad you still feel well despite the loose stool- please let me know if that changes.  Make sure not taking colace/docusate- I removed that from your med list today  Make sure not taking miralax- also took this off your med list today  " Considered lab work -t3 and t4 ok in january but we discussed if persistent would need to check these.  Doubt current issues are due to hypothyroidism -Known CKD stage III with unilateral kidney- Given loose stools are not of significant increased frequency-did not feel strongly about updating kidney function  Future Appointments  Date Time Provider [place]  10/13/2018  8:20 AM Marin Olp, MD [facility]    Return precautions advised.  [name], MD

## 2018-09-02 NOTE — Patient Instructions (Addendum)
You may have picked up a virus but typically these dont last more than 10 days and typically cause more loose stools then what you are having.   The more likely thing is your gut flora/bacteria in your stomach is just off. Let's have you eat at least 1 activia yogurt a day for the next 2-3 weeks to see if that helps.   If you continue to have loose stools- we could do a stool study to rule out any bacterial infection. Message me in 2 weeks if not improving.   Glad you still feel well despite the loose stool- please let me know if that changes.   Make sure not taking colace/docusate- I removed that from your med list today  Make sure not taking miralax- also took this off your med list today

## 2018-09-12 DIAGNOSIS — G4733 Obstructive sleep apnea (adult) (pediatric): Secondary | ICD-10-CM | POA: Diagnosis not present

## 2018-09-14 ENCOUNTER — Other Ambulatory Visit: Payer: Self-pay | Admitting: Family Medicine

## 2018-10-03 DIAGNOSIS — G4733 Obstructive sleep apnea (adult) (pediatric): Secondary | ICD-10-CM | POA: Diagnosis not present

## 2018-10-07 ENCOUNTER — Other Ambulatory Visit: Payer: Self-pay | Admitting: Family Medicine

## 2018-10-10 NOTE — Telephone Encounter (Signed)
Pt has an appt 10/13/2018

## 2018-10-13 ENCOUNTER — Ambulatory Visit (INDEPENDENT_AMBULATORY_CARE_PROVIDER_SITE_OTHER): Payer: Medicare Other | Admitting: Family Medicine

## 2018-10-13 DIAGNOSIS — G4733 Obstructive sleep apnea (adult) (pediatric): Secondary | ICD-10-CM | POA: Diagnosis not present

## 2018-10-13 NOTE — Progress Notes (Signed)
LOS no charge- patient did not show up for visit. In current covid 19 environment- we are not charging for no shows. All patients are "arrived" virtually on day of visit before arrival so notes have to be closed with LOS no charge instead of no show

## 2018-10-13 NOTE — Patient Instructions (Signed)
There are no preventive care reminders to display for this patient.  Depression screen [facility] 2/9 07/20/2018 06/10/2018 03/16/2018  Decreased Interest 0 1 0  Down, Depressed, Hopeless 0 0 0  PHQ - 2 Score 0 1 0  Altered sleeping 1 3 -  Tired, decreased energy 3 3 -  Change in appetite 0 1 -  Feeling bad or failure about yourself  0 0 -  Trouble concentrating 0 1 -  Moving slowly or fidgety/restless 1 0 -  Suicidal thoughts 0 0 -  PHQ-9 Score 5 9 -  Difficult doing work/chores Not difficult at all Somewhat difficult -  Some recent data might be hidden

## 2018-10-16 ENCOUNTER — Other Ambulatory Visit: Payer: Self-pay | Admitting: Family Medicine

## 2018-10-24 ENCOUNTER — Telehealth: Payer: Self-pay | Admitting: Adult Health

## 2018-10-24 ENCOUNTER — Other Ambulatory Visit: Payer: Self-pay

## 2018-10-24 ENCOUNTER — Ambulatory Visit (INDEPENDENT_AMBULATORY_CARE_PROVIDER_SITE_OTHER): Payer: Medicare Other | Admitting: Adult Health

## 2018-10-24 ENCOUNTER — Telehealth: Payer: Self-pay | Admitting: Pulmonary Disease

## 2018-10-24 ENCOUNTER — Ambulatory Visit (INDEPENDENT_AMBULATORY_CARE_PROVIDER_SITE_OTHER): Payer: Medicare Other

## 2018-10-24 ENCOUNTER — Encounter: Payer: Self-pay | Admitting: Adult Health

## 2018-10-24 VITALS — BP 130/76 | HR 87 | Ht 72.0 in | Wt 286.0 lb

## 2018-10-24 DIAGNOSIS — J4540 Moderate persistent asthma, uncomplicated: Secondary | ICD-10-CM

## 2018-10-24 DIAGNOSIS — R0602 Shortness of breath: Secondary | ICD-10-CM

## 2018-10-24 DIAGNOSIS — G4733 Obstructive sleep apnea (adult) (pediatric): Secondary | ICD-10-CM

## 2018-10-24 DIAGNOSIS — J9611 Chronic respiratory failure with hypoxia: Secondary | ICD-10-CM

## 2018-10-24 DIAGNOSIS — I5032 Chronic diastolic (congestive) heart failure: Secondary | ICD-10-CM

## 2018-10-24 NOTE — Telephone Encounter (Signed)
Called and spoke with pt who stated he has been having increased SOB with exertion which he states has been worse x3 weeks. Pt states he has had to use his rescue inhaler at least twice daily to help with his symptoms and pt states that he does get relief.  Pt states he becomes real SOB when walking short distances now. I stated to pt we should schedule him a televisit to further evaluate and pt expressed understanding. televisit has been scheduled for pt with TP at 2:30. Nothing further needed.

## 2018-10-24 NOTE — Assessment & Plan Note (Signed)
History of some mild intermittent asthmatic bronchitis does not appear to be having active symptoms currently.  Previously tried on Symbicort.  Did not have any perceived benefit.  PFTs showed more of a restrictive pattern.  Patient may use his albuterol as needed.  If we start to see that he is using albuterol more consistently can consider repeat PFTs and/or retrial of ICS laba  combination.

## 2018-10-24 NOTE — Progress Notes (Signed)
_0  ID: Wayne Booth, male    DOB: March 09, 1941, 78 y.o.   MRN: [id number]  Chief Complaint  Patient presents with  . Follow-up    COPD     Referring provider: Marin Olp, MD  HPI: 77 year old male former smoker followed for recurrent pneumonia and asthmatic bronchitis Patient has severe obstructive sleep apnea with CPAP intolerance and declines further evaluation. PE April 2018 on anticoagulation  Medical history significant for previous pituitary tumor removal, A. Fib, renal cell carcinoma status post nephrectomy, Diastolic CHF    TEST/EVENTS :  CT chest June 08, 2017 showed a small left lower lobe area of consolidation.  PFT 07/2017 moderate restriction, DLCO 67%  CT chest October 2019 small to moderate bilateral pleural effusions consolidations within the left greater than right lower lobe and lingula.  Hazy density in the left greater than right lower lobe.  2D echo February 04, 2018 showed EF 50 to 55%  10/24/2018 Follow up : Asthmatic Bronchitis , and Oxygen dependent Respiratory Failure  Patient returns for a 6 months follow up.  Patient says overall he is doing about the same.  When he is at rest he feels okay with no significant dyspnea however when he gets up to do activity he gets very short of breath.  Due to this he is becoming more more interactive. In 2019 patient was hospitalized a couple times he had a prolonged hospitalization with MRSA pneumonia complicated by respiratory failure and sepsis and a prolonged rehab stay.  He also was admitted after this for decompensated heart failure and bilateral pleural effusions that required thoracentesis.  Patient did require physical therapy at home which he says he did complete this and felt that he got some better but is remained weak.  Patient was discharged on home oxygen at 2 L.  He says he does have portable oxygen tanks at home but is unable to carry those or get out and do anything because they are too  heavy.  He is requesting a portable oxygen system.  Today in the office patient does drop his oxygen level with walking on room air at 86%.  And was able to keep O2 saturations greater than 90% on 2 L of oxygen. Patient has diastolic heart failure.  He is on Lasix 40 mg twice daily.  Patient says that he has chronic leg swelling , has not noticed any increase in his swelling.  However weight has been slowly going up over the last few months.  Patient denies any orthopnea.  But he does sleep in a recliner.  Patient has known severe sleep apnea but has declined CPAP in the past.  Long discussion regarding sleep apnea and potential complications including cardiac. Patient does admit that he has a very good appetite and eats quite a bit.  We discussed a healthy diet with low salt.  And decrease caloric intake  Allergies  Allergen Reactions  . Ambien [Zolpidem] Anxiety    Anxiety and agitation when tried as sleeper in hospital aug 2019    Immunization History  Administered Date(s) Administered  . Influenza Split 04/01/2011, 03/29/2012  . Influenza Whole 04/12/2007, 03/18/2009, 03/04/2010  . Influenza, High Dose Seasonal PF 04/03/2014, 04/04/2015, 04/24/2016, 04/12/2017, 04/26/2018  . Influenza,inj,Quad PF,6+ Mos 03/22/2013  . Influenza-Unspecified 05/29/2017  . Pneumococcal Conjugate-13 11/12/2014  . Pneumococcal Polysaccharide-23 06/09/2011  . Pneumococcal-Unspecified 06/30/2015  . Td 06/29/1994  . Tdap 06/09/2011  . Zoster 09/22/2007    Past Medical History:  Diagnosis Date  .  Allergy    States his nose runs when he is around grease.   Marland Kitchen Anxiety   . Arthritis   . Atrial fibrillation ([place])   . Cancer ([place])   . Coronary artery disease   . History of total knee replacement   . Hypertension   . Hypothyroidism   . Kidney cancer, primary, with metastasis from kidney to other site [facility])   . Kidney stone   . OSA (obstructive sleep apnea)    pt does not wear cpap at night  . Pituitary  cyst ([place])   . Pneumonia    hx of  . Prostate cancer ([place])     Tobacco History: Social History   Tobacco Use  Smoking Status Former Smoker  . Years: 4.00  . Types: Cigars  Smokeless Tobacco Never Used  Tobacco Comment   smoked cigars 1-2x per year; QUIT 2 YEARS AGO   2017   Counseling given: Not Answered Comment: smoked cigars 1-2x per year; QUIT 2 YEARS AGO   2017   Outpatient Medications Prior to Visit  Medication Sig Dispense Refill  . albuterol (PROVENTIL HFA;VENTOLIN HFA) 108 (90 Base) MCG/ACT inhaler Inhale 2 puffs into the lungs every 6 (six) hours as needed for wheezing or shortness of breath. 1 Inhaler 3  . allopurinol (ZYLOPRIM) 100 MG tablet Take 1 tablet (100 mg total) by mouth daily. 30 tablet 6  . apixaban (ELIQUIS) 5 MG TABS tablet Take 1 tablet (5 mg total) by mouth 2 (two) times daily. 60 tablet 11  . busPIRone (BUSPAR) 5 MG tablet Take 1 tablet (5 mg total) by mouth 2 (two) times daily as needed (anxiety). 30 tablet 5  . colchicine 0.6 MG tablet Take 1 tablet (0.6 mg total) by mouth 2 (two) times daily. 60 tablet 0  . cyclobenzaprine (FLEXERIL) 10 MG tablet TAKE ONE TABLET BY MOUTH THREE TIMES A DAY AS NEEDED FOR MUSCLE SPASMS 30 tablet 0  . escitalopram (LEXAPRO) 20 MG tablet Take 1 tablet (20 mg total) by mouth daily. 90 tablet 3  . furosemide (LASIX) 40 MG tablet Take 1-2 tablets (40-80 mg total) by mouth 2 (two) times daily. 120 tablet 5  . levothyroxine (SYNTHROID, LEVOTHROID) 150 MCG tablet TAKE ONE TABLET BY MOUTH DAILY 90 tablet 1  . Multiple Vitamins-Minerals (MULTIVITAMINS THER. W/MINERALS) TABS Take 1 tablet by mouth daily.    . nortriptyline (PAMELOR) 75 MG capsule TAKE ONE CAPSULE BY MOUTH EVERY NIGHT AT BEDTIME 90 capsule 1  . potassium chloride SA (K-DUR,KLOR-CON) 20 MEQ tablet Take 1 tablet (20 mEq total) by mouth 2 (two) times daily. 60 tablet 2  . propranolol (INDERAL) 10 MG tablet TAKE ONE TABLET BY MOUTH TWICE A DAY 60 tablet 1  . traMADol  (ULTRAM) 50 MG tablet Take 1 tablet (50 mg total) by mouth every 6 (six) hours as needed for moderate pain. 30 tablet 2  . traZODone (DESYREL) 50 MG tablet TAKE ONE-HALF TO 1 TABLET BY MOUTH AT BEDTIME AS NEEDED FOR SLEEP 90 tablet 0   No facility-administered medications prior to visit.      Review of Systems:   Constitutional:   No  weight loss, night sweats,  Fevers, chills, + fatigue, or  lassitude.  HEENT:   No headaches,  Difficulty swallowing,  Tooth/dental problems, or  Sore throat,                No sneezing, itching, ear ache, + nasal congestion, post nasal drip,   CV:  No  chest pain,  Orthopnea, PND, +swelling in lower extremities,  No anasarca, dizziness, palpitations, syncope.   GI  No heartburn, indigestion, abdominal pain, nausea, vomiting, diarrhea, change in bowel habits, loss of appetite, bloody stools.   Resp:  .  No chest wall deformity  Skin: no rash or lesions.  GU: no dysuria, change in color of urine, no urgency or frequency.  No flank pain, no hematuria   MS:  No joint pain or swelling.  No decreased range of motion.  No back pain.    Physical Exam  BP 130/76 (BP Location: Left Arm, Cuff Size: Large)   Pulse 87   Ht 6' (1.829 m)   Wt 286 lb (129.7 kg)   SpO2 95%   BMI 38.79 kg/m   GEN: A/Ox3; pleasant , NAD, obese and elderly male   HEENT:  [place]/AT,  EACs-clear, TMs-wnl, NOSE-clear, THROAT-clear, no lesions, no postnasal drip or exudate noted.   NECK:  Supple w/ fair ROM; no JVD; normal carotid impulses w/o bruits; no thyromegaly or nodules palpated; no lymphadenopathy.    RESP  Clear  P & A; w/o, wheezes/ rales/ or rhonchi. no accessory muscle use, no dullness to percussion  CARD:  RRR, no m/r/g, 1+o peripheral edema, pulses intact, no cyanosis or clubbing.  GI:   Soft & nt; nml bowel sounds; no organomegaly or masses detected.  Large abdomen  Musco: Warm bil, no deformities or joint swelling noted.   Neuro: alert, no focal deficits  noted.    Skin: Warm, no lesions or rashes    Lab Results:  CBC    Component Value Date/Time   WBC 9.3 07/20/2018 0836   RBC 5.32 07/20/2018 0836   HGB 15.8 07/20/2018 0836   HGB 16.0 01/21/2018 0000   HCT 47.5 07/20/2018 0836   HCT 46.7 01/21/2018 0000   PLT 277.0 07/20/2018 0836   PLT 341 01/21/2018 0000   MCV 89.3 07/20/2018 0836   MCV 91 01/21/2018 0000   MCH 28.8 04/20/2018 0247   MCHC 33.2 07/20/2018 0836   RDW 16.6 (H) 07/20/2018 0836   RDW 14.4 01/21/2018 0000   LYMPHSABS 2.0 05/10/2018 0957   MONOABS 1.1 (H) 05/10/2018 0957   EOSABS 1.3 (H) 05/10/2018 0957   BASOSABS 0.1 05/10/2018 0957    BMET    Component Value Date/Time   NA 136 08/10/2018 0810   NA 136 01/21/2018 0000   K 4.1 08/10/2018 0810   CL 99 08/10/2018 0810   CO2 30 08/10/2018 0810   GLUCOSE 100 (H) 08/10/2018 0810   BUN 15 08/10/2018 0810   BUN 15 01/21/2018 0000   CREATININE 1.24 08/10/2018 0810   CREATININE 1.16 11/27/2016 1555   CALCIUM 9.2 08/10/2018 0810   GFRNONAA 60 (L) 04/20/2018 0247   GFRAA >60 04/20/2018 0247    BNP    Component Value Date/Time   BNP 178.0 (H) 04/14/2018 1808    ProBNP    Component Value Date/Time   PROBNP 96.0 07/20/2018 0836    Imaging: Dg Chest 2 View  Result Date: 10/24/2018 CLINICAL DATA:  Shortness of breath. EXAM: CHEST - 2 VIEW COMPARISON:  07/20/2018 FINDINGS: The heart size is stable. There is a component of chronic lung disease with chronic interstitial and bronchial prominence present. The lungs are not overtly hyperinflated. A component of pulmonary interstitial edema or acute bronchitis cannot be excluded. No significant focal airspace consolidation, pleural fluid or evidence of pneumothorax. IMPRESSION: Chronic lung disease with interstitial and bronchial prominence. A component  of acute pulmonary interstitial edema or bronchitis cannot be excluded. Electronically Signed   By: Aletta Edouard M.D.   On: 10/24/2018 16:23      PFT  Results Latest Ref Rng & Units 08/11/2017  FVC-Pre L 2.20  FVC-Predicted Pre % 54  FVC-Post L 2.16  FVC-Predicted Post % 53  Pre FEV1/FVC % % 79  Post FEV1/FCV % % 84  FEV1-Pre L 1.74  FEV1-Predicted Pre % 59  FEV1-Post L 1.81  DLCO UNC% % 67  DLCO COR %Predicted % 132  TLC L 4.77  TLC % Predicted % 69  RV % Predicted % 96    No results found for: NITRICOXIDE      Assessment & Plan:   Asthmatic bronchitis History of some mild intermittent asthmatic bronchitis does not appear to be having active symptoms currently.  Previously tried on Symbicort.  Did not have any perceived benefit.  PFTs showed more of a restrictive pattern.  Patient may use his albuterol as needed.  If we start to see that he is using albuterol more consistently can consider repeat PFTs and/or retrial of ICS laba  combination.  OSA (obstructive sleep apnea) Very severe sleep apnea with a AHI greater than 100 on previous sleep study.  Have advised patient on the potential complications of untreated sleep apnea.  Patient declines CPAP.  Going forward may want to consider a referral to ENT to consider inspire if patient was to be a Candidate.  Once COVID precautions are lifted  (HFpEF) heart failure with preserved ejection fraction (HCC) Diastolic heart failure question if ongoing dyspnea with activity is related to this along with severe deconditioning and morbid obesity.  Will check a chest x-ray today along with labs including a CBC be met and BNP.  Will adjust Lasix slightly with an extra 40 mg tomorrow.  As patient has underlying chronic kidney disease.  Would also like to look at his electrolytes prior to increasing his diuresis. Advised on weight loss and low-salt diet  Plan  Patient Instructions  Order for POC oxygen device Continue on Oxygen 2l/m with activity and At bedtime  . Take extra Lasix 76m tomorrow . Continue after this Lasix 468mTwice daily   Low salt diet . Legs elevated .  Labs and Chest  xray today .  Work on weight loss .  Advance activity as tolerated  Follow up with Dr. AlElsworth SohoOr Omaya Nieland NP in 4-6  weeks and As needed   Please contact office for sooner follow up if symptoms do not improve or worsen or seek emergency care         Chronic respiratory failure with hypoxia ([facility]atient is on home oxygen with 2 L with activity and at bedtime.  Patient has desaturations with ambulation.  On room air.  He does need a portable concentrator to help him be more mobile and independent.  An order has been sent.  Patient is advised to use oxygen 2 L with activity and at bedtime.     TaRexene EdisonNP 10/24/2018

## 2018-10-24 NOTE — Telephone Encounter (Signed)
LVM for patient to call back to advise if he is currently on oxygen and that in order to get POC, he would have to come into the clinic for qualifying walk to know what setting would work for him. Requested he call back to make appointment.

## 2018-10-24 NOTE — Assessment & Plan Note (Signed)
Very severe sleep apnea with a AHI greater than 100 on previous sleep study.  Have advised patient on the potential complications of untreated sleep apnea.  Patient declines CPAP.  Going forward may want to consider a referral to ENT to consider inspire if patient was to be a Candidate.  Once COVID precautions are lifted

## 2018-10-24 NOTE — Assessment & Plan Note (Signed)
Patient is on home oxygen with 2 L with activity and at bedtime.  Patient has desaturations with ambulation.  On room air.  He does need a portable concentrator to help him be more mobile and independent.  An order has been sent.  Patient is advised to use oxygen 2 L with activity and at bedtime.

## 2018-10-24 NOTE — Assessment & Plan Note (Signed)
Diastolic heart failure question if ongoing dyspnea with activity is related to this along with severe deconditioning and morbid obesity.  Will check a chest x-ray today along with labs including a CBC be met and BNP.  Will adjust Lasix slightly with an extra 40 mg tomorrow.  As patient has underlying chronic kidney disease.  Would also like to look at his electrolytes prior to increasing his diuresis. Advised on weight loss and low-salt diet  Plan  Patient Instructions  Order for POC oxygen device Continue on Oxygen 2l/m with activity and At bedtime  . Take extra Lasix 55m tomorrow . Continue after this Lasix 426mTwice daily   Low salt diet . Legs elevated .  Labs and Chest xray today .  Work on weight loss .  Advance activity as tolerated  Follow up with Dr. AlElsworth SohoOr Mackie Goon NP in 4-6  weeks and As needed   Please contact office for sooner follow up if symptoms do not improve or worsen or seek emergency care

## 2018-10-24 NOTE — Patient Instructions (Addendum)
Order for POC oxygen device Continue on Oxygen 2l/m with activity and At bedtime  . Take extra Lasix 40mg  tomorrow . Continue after this Lasix 40mg  Twice daily   Low salt diet . Legs elevated .  Labs and Chest xray today .  Work on weight loss .  Advance activity as tolerated  Follow up with Dr. Elsworth Soho  Or Anushri Casalino NP in 4-6  weeks and As needed   Please contact office for sooner follow up if symptoms do not improve or worsen or seek emergency care

## 2018-10-24 NOTE — Telephone Encounter (Signed)
Judeen Hammans has already sent a message to [facility] about this order

## 2018-10-25 ENCOUNTER — Telehealth: Payer: Self-pay | Admitting: Pulmonary Disease

## 2018-10-25 LAB — CBC WITH DIFFERENTIAL/PLATELET
Basophils Absolute: 0.2 10*3/uL — ABNORMAL HIGH (ref 0.0–0.1)
Basophils Relative: 1.8 % (ref 0.0–3.0)
Eosinophils Absolute: 0.8 10*3/uL — ABNORMAL HIGH (ref 0.0–0.7)
Eosinophils Relative: 6.9 % — ABNORMAL HIGH (ref 0.0–5.0)
HCT: 51.5 % (ref 39.0–52.0)
Hemoglobin: 17.4 g/dL — ABNORMAL HIGH (ref 13.0–17.0)
Lymphocytes Relative: 18.6 % (ref 12.0–46.0)
Lymphs Abs: 2 10*3/uL (ref 0.7–4.0)
MCHC: 33.8 g/dL (ref 30.0–36.0)
MCV: 90.5 fl (ref 78.0–100.0)
Monocytes Absolute: 1.2 10*3/uL — ABNORMAL HIGH (ref 0.1–1.0)
Monocytes Relative: 11.2 % (ref 3.0–12.0)
Neutro Abs: 6.7 10*3/uL (ref 1.4–7.7)
Neutrophils Relative %: 61.5 % (ref 43.0–77.0)
Platelets: 321 10*3/uL (ref 150.0–400.0)
RBC: 5.68 Mil/uL (ref 4.22–5.81)
RDW: 15.7 % — ABNORMAL HIGH (ref 11.5–15.5)
WBC: 10.9 10*3/uL — ABNORMAL HIGH (ref 4.0–10.5)

## 2018-10-25 LAB — BASIC METABOLIC PANEL
BUN: 14 mg/dL (ref 6–23)
CO2: 29 mEq/L (ref 19–32)
Calcium: 9.3 mg/dL (ref 8.4–10.5)
Chloride: 97 mEq/L (ref 96–112)
Creatinine, Ser: 1.45 mg/dL (ref 0.40–1.50)
GFR: 47.27 mL/min — ABNORMAL LOW (ref >60.00)
Glucose, Bld: 96 mg/dL (ref 70–99)
Potassium: 4.3 mEq/L (ref 3.5–5.1)
Sodium: 135 mEq/L (ref 135–145)

## 2018-10-25 LAB — BRAIN NATRIURETIC PEPTIDE: Pro B Natriuretic peptide (BNP): 52 pg/mL (ref 0.0–100.0)

## 2018-10-25 NOTE — Progress Notes (Signed)
LMOMTCB x 1 

## 2018-10-25 NOTE — Telephone Encounter (Signed)
Call returned to patient, made aware of CXR results. Voiced understanding. Nothing further is needed at this time.

## 2018-10-26 ENCOUNTER — Telehealth: Payer: Self-pay | Admitting: Adult Health

## 2018-10-26 NOTE — Telephone Encounter (Signed)
Called patient and made aware referral for POC has been faxed to [facility]. He was provided with contact # to [facility] to f/u on status Nothing further needed.

## 2018-10-26 NOTE — Telephone Encounter (Signed)
Attempted to call patient today regarding results and POC concerns. I did not receive an answer at time of call. I have left a voicemail message for pt to return call. X1

## 2018-10-27 NOTE — Telephone Encounter (Signed)
Resolved Pt aware order already sent to [facility]. Nothing further needed.

## 2018-10-28 ENCOUNTER — Other Ambulatory Visit: Payer: Self-pay | Admitting: Family Medicine

## 2018-10-31 ENCOUNTER — Telehealth: Payer: Self-pay | Admitting: Adult Health

## 2018-10-31 MED ORDER — BUDESONIDE-FORMOTEROL FUMARATE 80-4.5 MCG/ACT IN AERO: 2.0000 | INHALATION_SPRAY | Freq: Two times a day (BID) | RESPIRATORY_TRACT | 12 refills | Status: DC

## 2018-10-31 MED ORDER — MONTELUKAST SODIUM 10 MG PO TABS: 10.0000 mg | ORAL_TABLET | Freq: Every day | ORAL | 11 refills | Status: DC

## 2018-10-31 NOTE — Telephone Encounter (Signed)
Notes recorded by Melvenia Needles, NP on 10/25/2018 at 4:12 PM EDT Chronic kidney disease -stable  CHF marker stable   Blood works showed elevated allergen level ( elevated eosinophils ) - this is still high but better then before  Will need to monitor - (previous CTD/autoimmune neg (ANCA) Suspect he has allergic asthma -  Would recommend he begin Zyrtec 10mg  At bedtime And Singulair daily , restart Symbicort 2 puffs Twice daily  Please send rx for  Symbicort 80/4.71mcg 2 puffs Twice daily , rinse after use.  Singulair 10mg  At bedtime .   Get zyrtec 10mg  At bedtime -otc .   Office visit in 4-6 weeks in office to re-evaluate (with repeat cbc w/ diff )   Please contact office for sooner follow up if symptoms do not improve or worsen or seek emergency care    Would repeat in 2-3 months   Pt returned call. I Stated to pt the results of the labwork and also stated to pt that TP wanted him to restart Symbicort, begin Singulair, and also begin Zyrtec. Stated to pt that he will need to come to office at scheduled OV with TP as she will repeat labwork and pt expressed understanding. Stated to pt that I will be sending Rx of Symbicort and Singulair to pharmacy for him and stated that he could get Zyrtec OTC. Pt expressed understanding and asked if I could make a note to pharmacy so they could have zyrtec already pulled once he went to get other Rx and I stated to pt that I would make a note of that in one of the Rx's that I send. Nothing further needed.

## 2018-11-02 DIAGNOSIS — G4733 Obstructive sleep apnea (adult) (pediatric): Secondary | ICD-10-CM | POA: Diagnosis not present

## 2018-11-12 ENCOUNTER — Other Ambulatory Visit: Payer: Self-pay | Admitting: Family Medicine

## 2018-11-14 ENCOUNTER — Ambulatory Visit: Payer: Self-pay | Admitting: *Deleted

## 2018-11-14 ENCOUNTER — Encounter: Payer: Self-pay | Admitting: Family Medicine

## 2018-11-14 ENCOUNTER — Ambulatory Visit (INDEPENDENT_AMBULATORY_CARE_PROVIDER_SITE_OTHER): Payer: Medicare Other | Admitting: Family Medicine

## 2018-11-14 VITALS — Wt 294.0 lb

## 2018-11-14 DIAGNOSIS — I2782 Chronic pulmonary embolism: Secondary | ICD-10-CM

## 2018-11-14 DIAGNOSIS — I5032 Chronic diastolic (congestive) heart failure: Secondary | ICD-10-CM

## 2018-11-14 DIAGNOSIS — N183 Chronic kidney disease, stage 3 unspecified: Secondary | ICD-10-CM

## 2018-11-14 MED ORDER — TRAZODONE HCL 50 MG PO TABS: ORAL_TABLET | ORAL | 3 refills | Status: DC

## 2018-11-14 NOTE — Assessment & Plan Note (Signed)
S: patient is compliant with eliquis. Doubt PE as cause of symptoms A/P: likely stable- continue long term eliquis

## 2018-11-14 NOTE — Telephone Encounter (Signed)
Recently started retaining fluid and has started feeling short of breath. Worsening at this time. Noticeable SOB while speaking with patient. He has taken fluid medication in the past and would like to be placed back on it.  Desires a visit today. Explained it would be a virtual visit. Warm transferred to [facility] for appointment.   Answer Assessment - Initial Assessment Questions 1. RESPIRATORY STATUS: "Describe your breathing?" (e.g., wheezing, shortness of breath, unable to speak, severe coughing)      Patient is having active SOB noticed during our call. 2. ONSET: "When did this breathing problem begin?"      3. PATTERN "Does the difficult breathing come and go, or has it been constant since it started?"     constant 4. SEVERITY: "How bad is your breathing?" (e.g., mild, moderate, severe)    - MILD: No SOB at rest, mild SOB with walking, speaks normally in sentences, can lay down, no retractions, pulse < 100.    - MODERATE: SOB at rest, SOB with minimal exertion and prefers to sit, cannot lie down flat, speaks in phrases, mild retractions, audible wheezing, pulse 100-120.    - SEVERE: Very SOB at rest, speaks in single words, struggling to breathe, sitting hunched forward, retractions, pulse > 120      5. RECURRENT SYMPTOM: "Have you had difficulty breathing before?" If so, ask: "When was the last time?" and "What happened that time?"     Yes, in the past. 6. CARDIAC HISTORY: "Do you have any history of heart disease?" (e.g., heart attack, angina, bypass surgery, angioplasty)       7. LUNG HISTORY: "Do you have any history of lung disease?"  (e.g., pulmonary embolus, asthma, emphysema)      8. CAUSE: "What do you think is causing the breathing problem?"      Fluid retention 9. OTHER SYMPTOMS: "Do you have any other symptoms? (e.g., dizziness, runny nose, cough, chest pain, fever)     Chest feels sore from breathing in deep 10. PREGNANCY: "Is there any chance you are pregnant?" "When was  your last menstrual period?"      na 11. TRAVEL: "Have you traveled out of the country in the last month?" (e.g., travel history, exposures)  Protocols used: BREATHING DIFFICULTY-A-AH

## 2018-11-14 NOTE — Progress Notes (Signed)
Phone [phone]   Subjective:  Virtual visit via Video note. Chief complaint: Chief Complaint  Patient presents with  . Edema    both feet   . Shortness of Breath    Soreness in mid chest    This visit type was conducted due to national recommendations for restrictions regarding the COVID-19 Pandemic (e.g. social distancing).  This format is felt to be most appropriate for this patient at this time balancing risks to patient and risks to population by having him in for in person visit.  No physical exam was performed (except for noted visual exam or audio findings with Telehealth visits).    Our team/I connected with [name] at 10:40 AM EDT by a video enabled telemedicine application ([email or web address] or caregility through epic) and verified that I am speaking with the correct person using two identifiers.  Location patient: [place] Location provider: [facility], office Persons participating in the virtual visit:  patient  Our team/I discussed the limitations of evaluation and management by telemedicine and the availability of in person appointments. In light of current covid-19 pandemic, patient also understands that we are trying to protect them by minimizing in office contact if at all possible.  The patient expressed consent for telemedicine visit and agreed to proceed. Patient understands insurance will be billed.   ROS- No fever, chills, cough, new body aches, sore throat, or loss of taste or smell   Past Medical History-  Patient Active Problem List   Diagnosis Date Noted  . Memory loss 06/10/2018    Priority: High  . Chronic respiratory failure with hypoxia (HCC) 04/26/2018    Priority: High  . CAD (coronary artery disease) 04/09/2018    Priority: High  . CKD (chronic kidney disease) stage 3, GFR 30-59 ml/min (HCC)     Priority: High  . (HFpEF) heart failure with preserved ejection fraction ([place]) 12/16/2017    Priority: High  . Chest pain     Priority: High  . Atrial  flutter ([place]) 11/01/2017    Priority: High  . Chronic pulmonary embolism ([place]) 10/23/2016    Priority: High  . Asthmatic bronchitis 06/09/2011    Priority: High  . History of prostate cancer-seed implantation 2010.  Dr. Alinda Money 10/02/2008    Priority: High  . History of renal cell carcinoma 12/26/2007    Priority: High  . PTSD (post-traumatic stress disorder) 07/27/2018    Priority: Medium  . HCAP (healthcare-associated pneumonia) 02/03/2018    Priority: Medium  . History of adenomatous polyp of colon 04/12/2017    Priority: Medium  . Cervical disc disease 11/13/2015    Priority: Medium  . Essential tremor 08/14/2015    Priority: Medium  . Gout 08/14/2015    Priority: Medium  . Hypothyroidism 07/12/2014    Priority: Medium  . Hyperglycemia 07/12/2014    Priority: Medium  . GAD (generalized anxiety disorder) 11/07/2013    Priority: Medium  . OSA (obstructive sleep apnea) 07/07/2011    Priority: Medium  . Morbid obesity ([place]) 10/02/2008    Priority: Medium  . Insomnia 12/26/2007    Priority: Medium  . Essential hypertension 12/28/2006    Priority: Medium  . Aortic atherosclerosis ([place]) 05/10/2018    Priority: Low  . Constipation 01/21/2018    Priority: Low  . Anticoagulated     Priority: Low  . Bilateral foot pain 07/13/2017    Priority: Low  . Community acquired pneumonia 06/18/2017    Priority: Low  . Status post nephrectomy 06/09/2017  Priority: Low  . Actinic keratosis 11/27/2009    Priority: Low  . Osteoarthritis 11/13/2008    Priority: Low  . TESTOSTERONE DEFICIENCY 12/28/2006    Priority: Low  . First degree AV block 04/15/2018  . Pleural effusion 04/14/2018  . Chronic pain 04/09/2018  . Pain in left knee 07/15/2017  . VTE (venous thromboembolism) 06/09/2017    Medications- reviewed and updated Current Outpatient Medications  Medication Sig Dispense Refill  . albuterol (PROVENTIL HFA;VENTOLIN HFA) 108 (90 Base) MCG/ACT inhaler Inhale 2 puffs into  the lungs every 6 (six) hours as needed for wheezing or shortness of breath. 1 Inhaler 3  . allopurinol (ZYLOPRIM) 100 MG tablet TAKE 1 TABLET (100 MG TOTAL) BY MOUTH DAILY 90 tablet 5  . apixaban (ELIQUIS) 5 MG TABS tablet Take 1 tablet (5 mg total) by mouth 2 (two) times daily. 60 tablet 11  . budesonide-formoterol (SYMBICORT) 80-4.5 MCG/ACT inhaler Inhale 2 puffs into the lungs 2 (two) times a day. 1 Inhaler 12  . busPIRone (BUSPAR) 5 MG tablet Take 1 tablet (5 mg total) by mouth 2 (two) times daily as needed (anxiety). 30 tablet 5  . colchicine 0.6 MG tablet Take 1 tablet (0.6 mg total) by mouth 2 (two) times daily. 60 tablet 0  . cyclobenzaprine (FLEXERIL) 10 MG tablet TAKE ONE TABLET BY MOUTH THREE TIMES A DAY AS NEEDED FOR MUSCLE SPASMS 30 tablet 0  . escitalopram (LEXAPRO) 20 MG tablet Take 1 tablet (20 mg total) by mouth daily. 90 tablet 3  . furosemide (LASIX) 40 MG tablet Take 1-2 tablets (40-80 mg total) by mouth 2 (two) times daily. 120 tablet 5  . levothyroxine (SYNTHROID, LEVOTHROID) 150 MCG tablet TAKE ONE TABLET BY MOUTH DAILY 90 tablet 1  . montelukast (SINGULAIR) 10 MG tablet Take 1 tablet (10 mg total) by mouth daily. 30 tablet 11  . Multiple Vitamins-Minerals (MULTIVITAMINS THER. W/MINERALS) TABS Take 1 tablet by mouth daily.    . nortriptyline (PAMELOR) 75 MG capsule TAKE ONE CAPSULE BY MOUTH EVERY NIGHT AT BEDTIME 90 capsule 1  . potassium chloride SA (K-DUR) 20 MEQ tablet TAKE ONE TABLET BY MOUTH TWO TIMES A DAY 120 tablet 1  . propranolol (INDERAL) 10 MG tablet TAKE ONE TABLET BY MOUTH TWICE A DAY 60 tablet 1  . traMADol (ULTRAM) 50 MG tablet Take 1 tablet (50 mg total) by mouth every 6 (six) hours as needed for moderate pain. 30 tablet 2  . traZODone (DESYREL) 50 MG tablet TAKE ONE-HALF TO 1 TABLET BY MOUTH AT BEDTIME AS NEEDED FOR SLEEP 90 tablet 3   No current facility-administered medications for this visit.      Objective:  Wt 294 lb (133.4 kg)   BMI 39.87 kg/m   self reported vitals Gen: NAD, resting comfortably Lungs: nonlabored, normal respiratory rate  At rest. Appears winded when walking across his home Skin: appears dry, no obvious rash     Assessment and Plan   # CHF exacerbation/shortness of breath S: Feels like he has been retaining more fluid for about a month then has worsened recently in last few weeks. Has noted feet are more swollen.   Has started to feel shortness of breath with walking across the room. No shortness of breath at rest.   Weight is up to 294 and a few weeks ago was 286. Currently taking 1 lasix in morning and 1 in evening. Taking 1 potassium tablet twice a day as well. Sleeping in recliner- has for last year.  Wt Readings from Last 3 Encounters:  11/14/18 294 lb (133.4 kg)  10/24/18 286 lb (129.7 kg)  09/02/18 288 lb 6.4 oz (130.8 kg)  A/P: Patient with SOB with history of  HCAP leading to sepsis and near death- patient appropriately concerned about pneumonia and wanted x-ray. Due to limited PPE and our inability to have him in for x-ray (since he also has a potential symptom of covid in SOB and would need n 95 and full PPE)- I advised him to go to the hospital- he declined this. If not improving- we will have to see if we can coordinate wih [place] imaging without other symptoms of covid 19 - I do not feel strongly about coordinating outpatient testing at present.   The greater likelihood than covid 19 or pneumonia is CHF excerbation given  Lbs weight gain, increased edema. We will treat with 80mg  lasix BID up from 40mg  BID and reassess in 2-3 days (he will call to schedule). Will take 3 potassium per day instead of 2. He knows if he has worsening symptoms- to go to the emergency room.   CAD noted on ct scan august 2019- if he fails to improve consider cardiology consult as well (after x-ray if needed)   Chronic pulmonary embolism ([place]) S: patient is compliant with eliquis. Doubt PE as cause of symptoms A/P: likely  stable- continue long term eliquis   CKD (chronic kidney disease) stage 3, GFR 30-59 ml/min (HCC) S: patient with unilateral kidney- GFR typically over 40 A/P: do not want to use high dose lasix for prolonged period- if we have to use higher dose for more than a week or so and patients SOB improves with getting fluid off of him (making covid 19 even less likely)- could consider having him by for drive up labs potentially   Future Appointments  Date Time Provider [place]  11/17/2018  2:40 PM Marin Olp, MD [facility]  11/29/2018 10:00 AM Parrett, Fonnie Mu, NP [facility] None    Lab/Order associations: Chronic heart failure with preserved ejection fraction (HCC)  Other chronic pulmonary embolism without acute cor pulmonale (HCC)  CKD (chronic kidney disease) stage 3, GFR 30-59 ml/min (HCC)  Meds ordered this encounter  Medications  . traZODone (DESYREL) 50 MG tablet    Sig: TAKE ONE-HALF TO 1 TABLET BY MOUTH AT BEDTIME AS NEEDED FOR SLEEP    Dispense:  90 tablet    Refill:  3    Return precautions advised.  [name], MD

## 2018-11-14 NOTE — Patient Instructions (Signed)
There are no preventive care reminders to display for this patient.  Depression screen [facility] 2/9 07/20/2018 06/10/2018 03/16/2018  Decreased Interest 0 1 0  Down, Depressed, Hopeless 0 0 0  PHQ - 2 Score 0 1 0  Altered sleeping 1 3 -  Tired, decreased energy 3 3 -  Change in appetite 0 1 -  Feeling bad or failure about yourself  0 0 -  Trouble concentrating 0 1 -  Moving slowly or fidgety/restless 1 0 -  Suicidal thoughts 0 0 -  PHQ-9 Score 5 9 -  Difficult doing work/chores Not difficult at all Somewhat difficult -  Some recent data might be hidden

## 2018-11-14 NOTE — Assessment & Plan Note (Signed)
S: patient with unilateral kidney- GFR typically over 40 A/P: do not want to use high dose lasix for prolonged period- if we have to use higher dose for more than a week or so and patients SOB improves with getting fluid off of him (making covid 19 even less likely)- could consider having him by for drive up labs potentially

## 2018-11-14 NOTE — Telephone Encounter (Signed)
Forwarding to Dr. Hunter as FYI.  

## 2018-11-14 NOTE — Telephone Encounter (Signed)
See note, patient was triaged

## 2018-11-17 ENCOUNTER — Telehealth: Payer: Self-pay

## 2018-11-17 ENCOUNTER — Other Ambulatory Visit: Payer: Medicare Other

## 2018-11-17 ENCOUNTER — Encounter: Payer: Self-pay | Admitting: Family Medicine

## 2018-11-17 ENCOUNTER — Ambulatory Visit (INDEPENDENT_AMBULATORY_CARE_PROVIDER_SITE_OTHER): Payer: Medicare Other | Admitting: Family Medicine

## 2018-11-17 VITALS — Ht 72.0 in | Wt 294.0 lb

## 2018-11-17 DIAGNOSIS — J4540 Moderate persistent asthma, uncomplicated: Secondary | ICD-10-CM

## 2018-11-17 DIAGNOSIS — I2782 Chronic pulmonary embolism: Secondary | ICD-10-CM | POA: Diagnosis not present

## 2018-11-17 DIAGNOSIS — Z20822 Contact with and (suspected) exposure to covid-19: Secondary | ICD-10-CM

## 2018-11-17 DIAGNOSIS — I2510 Atherosclerotic heart disease of native coronary artery without angina pectoris: Secondary | ICD-10-CM | POA: Diagnosis not present

## 2018-11-17 DIAGNOSIS — I5032 Chronic diastolic (congestive) heart failure: Secondary | ICD-10-CM | POA: Diagnosis not present

## 2018-11-17 DIAGNOSIS — R6889 Other general symptoms and signs: Secondary | ICD-10-CM | POA: Diagnosis not present

## 2018-11-17 NOTE — Patient Instructions (Addendum)
There are no preventive care reminders to display for this patient.  Depression screen [facility] 2/9 11/17/2018 07/20/2018 06/10/2018  Decreased Interest 0 0 1  Down, Depressed, Hopeless 0 0 0  PHQ - 2 Score 0 0 1  Altered sleeping 1 1 3   Tired, decreased energy 0 3 3  Change in appetite 0 0 1  Feeling bad or failure about yourself  0 0 0  Trouble concentrating 0 0 1  Moving slowly or fidgety/restless 0 1 0  Suicidal thoughts 0 0 0  PHQ-9 Score 1 5 9   Difficult doing work/chores Not difficult at all Not difficult at all Somewhat difficult  Some recent data might be hidden

## 2018-11-17 NOTE — Telephone Encounter (Signed)
Dr. Yong Channel requesting COVID 19 testing.

## 2018-11-17 NOTE — Progress Notes (Signed)
Phone [phone]   Subjective:  Virtual visit via Video note. Chief complaint: Chief Complaint  Patient presents with  . Shortness of Breath   This visit type was conducted due to national recommendations for restrictions regarding the COVID-19 Pandemic (e.g. social distancing).  This format is felt to be most appropriate for this patient at this time balancing risks to patient and risks to population by having him in for in person visit.  No physical exam was performed (except for noted visual exam or audio findings with Telehealth visits).    Our team/I connected with Wayne Booth at  2:40 PM EDT by a video enabled telemedicine application (doxy.me or caregility through epic) and verified that I am speaking with the correct person using two identifiers.  Location patient: [place] Location provider: [facility], office Persons participating in the virtual visit:  patient  Our team/I discussed the limitations of evaluation and management by telemedicine and the availability of in person appointments. In light of current covid-19 pandemic, patient also understands that we are trying to protect them by minimizing in office contact if at all possible.  The patient expressed consent for telemedicine visit and agreed to proceed. Patient understands insurance will be billed.   ROS- still with shortness of breath- not improving much. Has wheezing. No cough or body aches reported. No fever reported   Past Medical History-  Patient Active Problem List   Diagnosis Date Noted  . Memory loss 06/10/2018    Priority: High  . Chronic respiratory failure with hypoxia (HCC) 04/26/2018    Priority: High  . CAD (coronary artery disease) 04/09/2018    Priority: High  . CKD (chronic kidney disease) stage 3, GFR 30-59 ml/min (HCC)     Priority: High  . (HFpEF) heart failure with preserved ejection fraction ([place]) 12/16/2017    Priority: High  . Chest pain     Priority: High  . Atrial flutter ([place])  11/01/2017    Priority: High  . Chronic pulmonary embolism ([place]) 10/23/2016    Priority: High  . Asthmatic bronchitis 06/09/2011    Priority: High  . History of prostate cancer-seed implantation 2010.  Dr. Alinda Money 10/02/2008    Priority: High  . History of renal cell carcinoma 12/26/2007    Priority: High  . PTSD (post-traumatic stress disorder) 07/27/2018    Priority: Medium  . HCAP (healthcare-associated pneumonia) 02/03/2018    Priority: Medium  . History of adenomatous polyp of colon 04/12/2017    Priority: Medium  . Cervical disc disease 11/13/2015    Priority: Medium  . Essential tremor 08/14/2015    Priority: Medium  . Gout 08/14/2015    Priority: Medium  . Hypothyroidism 07/12/2014    Priority: Medium  . Hyperglycemia 07/12/2014    Priority: Medium  . GAD (generalized anxiety disorder) 11/07/2013    Priority: Medium  . OSA (obstructive sleep apnea) 07/07/2011    Priority: Medium  . Morbid obesity ([place]) 10/02/2008    Priority: Medium  . Insomnia 12/26/2007    Priority: Medium  . Essential hypertension 12/28/2006    Priority: Medium  . Aortic atherosclerosis ([place]) 05/10/2018    Priority: Low  . Constipation 01/21/2018    Priority: Low  . Anticoagulated     Priority: Low  . Bilateral foot pain 07/13/2017    Priority: Low  . Community acquired pneumonia 06/18/2017    Priority: Low  . Status post nephrectomy 06/09/2017    Priority: Low  . Actinic keratosis 11/27/2009  Priority: Low  . Osteoarthritis 11/13/2008    Priority: Low  . TESTOSTERONE DEFICIENCY 12/28/2006    Priority: Low  . First degree AV block 04/15/2018  . Pleural effusion 04/14/2018  . Chronic pain 04/09/2018  . Pain in left knee 07/15/2017  . VTE (venous thromboembolism) 06/09/2017    Medications- reviewed and updated Current Outpatient Medications  Medication Sig Dispense Refill  . albuterol (PROVENTIL HFA;VENTOLIN HFA) 108 (90 Base) MCG/ACT inhaler Inhale 2 puffs into the lungs  every 6 (six) hours as needed for wheezing or shortness of breath. 1 Inhaler 3  . allopurinol (ZYLOPRIM) 100 MG tablet TAKE 1 TABLET (100 MG TOTAL) BY MOUTH DAILY 90 tablet 5  . apixaban (ELIQUIS) 5 MG TABS tablet Take 1 tablet (5 mg total) by mouth 2 (two) times daily. 60 tablet 11  . budesonide-formoterol (SYMBICORT) 80-4.5 MCG/ACT inhaler Inhale 2 puffs into the lungs 2 (two) times a day. 1 Inhaler 12  . busPIRone (BUSPAR) 5 MG tablet Take 1 tablet (5 mg total) by mouth 2 (two) times daily as needed (anxiety). 30 tablet 5  . colchicine 0.6 MG tablet Take 1 tablet (0.6 mg total) by mouth 2 (two) times daily. 60 tablet 0  . cyclobenzaprine (FLEXERIL) 10 MG tablet TAKE ONE TABLET BY MOUTH THREE TIMES A DAY AS NEEDED FOR MUSCLE SPASMS 30 tablet 0  . escitalopram (LEXAPRO) 20 MG tablet Take 1 tablet (20 mg total) by mouth daily. 90 tablet 3  . furosemide (LASIX) 40 MG tablet Take 1-2 tablets (40-80 mg total) by mouth 2 (two) times daily. 120 tablet 5  . levothyroxine (SYNTHROID, LEVOTHROID) 150 MCG tablet TAKE ONE TABLET BY MOUTH DAILY 90 tablet 1  . montelukast (SINGULAIR) 10 MG tablet Take 1 tablet (10 mg total) by mouth daily. 30 tablet 11  . Multiple Vitamins-Minerals (MULTIVITAMINS THER. W/MINERALS) TABS Take 1 tablet by mouth daily.    . nortriptyline (PAMELOR) 75 MG capsule TAKE ONE CAPSULE BY MOUTH EVERY NIGHT AT BEDTIME 90 capsule 1  . potassium chloride SA (K-DUR) 20 MEQ tablet TAKE ONE TABLET BY MOUTH TWO TIMES A DAY 120 tablet 1  . propranolol (INDERAL) 10 MG tablet TAKE ONE TABLET BY MOUTH TWICE A DAY 60 tablet 1  . traMADol (ULTRAM) 50 MG tablet Take 1 tablet (50 mg total) by mouth every 6 (six) hours as needed for moderate pain. 30 tablet 2  . traZODone (DESYREL) 50 MG tablet TAKE ONE-HALF TO 1 TABLET BY MOUTH AT BEDTIME AS NEEDED FOR SLEEP 90 tablet 3   No current facility-administered medications for this visit.      Objective:  Ht 6' (1.829 m)   Wt 294 lb (133.4 kg)   BMI  39.87 kg/m  self reported vitals Gen: NAD, resting comfortably Lungs: nonlabored, normal respiratory rate  At rest Skin: appears dry, no obvious rash    Assessment and Plan   #Shortness of breath and patient with known asthmatic bronchitis and heart failure with preserved ejection fraction. S:Patient has been taking 2 lasix twice a day up from 1 tablet twice a day. He is urinating more.   Weight has not decreased yet Swelling is slightly better Shortness of breath perhaps only mildly improved- still with SOB walking short distances even within the house.   Having some wheezing - using albuterol twice a day. Does feel better when he takes this- keeps it in his pocket.  SUpposed to be  using Symbicort twice a day- but we found out today hes not  taking it-he agrees to start. A/P: I think shortness of breath is likely multifactorial - For heart failure with preserved ejection fraction-continue Lasix 80 mg twice a day up from 40 mg twice a day. - For asthma-patient needs to restart his Symbicort-strongly advised this.  He can continue albuterol. - Patient with history of pneumonia leading to sepsis-he does not want to go to the emergency room still- in order here for him to get an x-ray in our building we need to make sure he does not have COVID-19- we have ordered COVID-19 testing today based off his shortness of breath. - Patient does have a history of PE but he is compliant with long-term Eliquis so doubt that is contributing -No chest pain reported.  Does have coronary artery disease based off CT scan August 2019.  He is not on aspirin as he is on Eliquis.  He is not on statin due to last LDL being under 70 -Once COVID-19 test comes back and if negative-we will get x-ray, CBC, CMP, BNP potentially.  Fortunately also has pulmonary follow-up soon-I think having negative COVID-19 testing would be reassuring for that evaluation as well -I advised follow-up with me next Tuesday -He knows if he  has new or worsening symptoms should proceed to emergency room-particularly worsening shortness of breath  Future Appointments  Date Time Provider [place]  11/29/2018 10:00 AM [place], Fonnie Mu, NP [facility] None   Lab/Order associations: Chronic heart failure with preserved ejection fraction (HCC)  Moderate persistent asthmatic bronchitis without complication  Other chronic pulmonary embolism without acute cor pulmonale (HCC)  Coronary artery disease involving native heart without angina pectoris, unspecified vessel or lesion type CHF appears to be improving-continue Lasix at higher dose.  Asthma with poor control-add Symbicort back.  Chronic PE-on chronic anticoagulant-likely stable.  CAD-likely stable- doubt ischemic cause of shortness of breath.  History of pneumonia-need to get x-ray but need to rule out COVID-19 first since he does not want to go to the emergency room.  Return precautions advised.  [name], MD

## 2018-11-18 ENCOUNTER — Telehealth: Payer: Self-pay | Admitting: *Deleted

## 2018-11-18 NOTE — Telephone Encounter (Signed)
Copied from [place] [id number]. Topic: Appointment Scheduling - Scheduling Inquiry for Clinic >> Nov 17, 2018  4:39 PM Mathis Bud wrote: Reason for CRM: Patient wife(joanna) called to set up check up appt with Patient. Call back # [phone]

## 2018-11-18 NOTE — Telephone Encounter (Signed)
Please call and schedule patient

## 2018-11-18 NOTE — Telephone Encounter (Signed)
Please call and schedule appointment.

## 2018-11-18 NOTE — Telephone Encounter (Signed)
LVM for patient to call back and schedule a follow up appt with Dr. Yong Channel

## 2018-11-20 LAB — NOVEL CORONAVIRUS, NAA: SARS-CoV-2, NAA: NOT DETECTED

## 2018-11-22 ENCOUNTER — Telehealth: Payer: Self-pay | Admitting: Family Medicine

## 2018-11-22 DIAGNOSIS — R0602 Shortness of breath: Secondary | ICD-10-CM

## 2018-11-22 NOTE — Addendum Note (Signed)
Addended by: Gwenyth Ober R on: 11/22/2018 03:21 PM   Modules accepted: Orders

## 2018-11-22 NOTE — Telephone Encounter (Signed)
See note

## 2018-11-22 NOTE — Telephone Encounter (Signed)
Patient called to give coronavirus test result negative and he says that now he would like to have a chest x-ray, he says Dr. Yong Channel wanted him to have the test before scheduling. I called the office and spoke to [place], [facility] who says to send a note over and let the patient know someone will call him back. He asks to be called back today, so that he can get this x-ray done.

## 2018-11-22 NOTE — Telephone Encounter (Signed)
Under shortness of breath  Also please get CBC, CMP, BNP under shortness of breath as well

## 2018-11-22 NOTE — Telephone Encounter (Signed)
Placing X-ray orders in for future  What would you like me to link it with?  Please advise

## 2018-11-22 NOTE — Telephone Encounter (Signed)
Pt has been scheduled at [facility] for Xray and requested labs.

## 2018-11-23 ENCOUNTER — Ambulatory Visit: Payer: Medicare Other

## 2018-11-23 ENCOUNTER — Ambulatory Visit (INDEPENDENT_AMBULATORY_CARE_PROVIDER_SITE_OTHER): Payer: Medicare Other

## 2018-11-23 ENCOUNTER — Other Ambulatory Visit: Payer: Self-pay

## 2018-11-23 DIAGNOSIS — R0602 Shortness of breath: Secondary | ICD-10-CM | POA: Diagnosis not present

## 2018-11-23 DIAGNOSIS — J849 Interstitial pulmonary disease, unspecified: Secondary | ICD-10-CM | POA: Diagnosis not present

## 2018-11-23 LAB — CBC WITH DIFFERENTIAL/PLATELET
Basophils Absolute: 0.1 10*3/uL (ref 0.0–0.1)
Basophils Relative: 1 % (ref 0.0–3.0)
Eosinophils Absolute: 0.6 10*3/uL (ref 0.0–0.7)
Eosinophils Relative: 6.1 % — ABNORMAL HIGH (ref 0.0–5.0)
HCT: 50.5 % (ref 39.0–52.0)
Hemoglobin: 17.5 g/dL — ABNORMAL HIGH (ref 13.0–17.0)
Lymphocytes Relative: 18.6 % (ref 12.0–46.0)
Lymphs Abs: 1.9 10*3/uL (ref 0.7–4.0)
MCHC: 34.7 g/dL (ref 30.0–36.0)
MCV: 91.1 fl (ref 78.0–100.0)
Monocytes Absolute: 1.3 10*3/uL — ABNORMAL HIGH (ref 0.1–1.0)
Monocytes Relative: 12.9 % — ABNORMAL HIGH (ref 3.0–12.0)
Neutro Abs: 6.2 10*3/uL (ref 1.4–7.7)
Neutrophils Relative %: 61.4 % (ref 43.0–77.0)
Platelets: 300 10*3/uL (ref 150.0–400.0)
RBC: 5.54 Mil/uL (ref 4.22–5.81)
RDW: 15.5 % (ref 11.5–15.5)
WBC: 10.2 10*3/uL (ref 4.0–10.5)

## 2018-11-23 LAB — COMPREHENSIVE METABOLIC PANEL
ALT: 31 U/L (ref 0–53)
AST: 28 U/L (ref 0–37)
Albumin: 3.6 g/dL (ref 3.5–5.2)
Alkaline Phosphatase: 87 U/L (ref 39–117)
BUN: 16 mg/dL (ref 6–23)
CO2: 29 mEq/L (ref 19–32)
Calcium: 9.1 mg/dL (ref 8.4–10.5)
Chloride: 97 mEq/L (ref 96–112)
Creatinine, Ser: 1.26 mg/dL (ref 0.40–1.50)
GFR: 55.58 mL/min — ABNORMAL LOW (ref >60.00)
Glucose, Bld: 95 mg/dL (ref 70–99)
Potassium: 4.4 mEq/L (ref 3.5–5.1)
Sodium: 135 mEq/L (ref 135–145)
Total Bilirubin: 0.7 mg/dL (ref 0.2–1.2)
Total Protein: 7.2 g/dL (ref 6.0–8.3)

## 2018-11-23 LAB — BRAIN NATRIURETIC PEPTIDE: Pro B Natriuretic peptide (BNP): 23 pg/mL (ref 0.0–100.0)

## 2018-11-29 ENCOUNTER — Ambulatory Visit: Payer: Medicare Other | Admitting: Adult Health

## 2018-11-29 ENCOUNTER — Encounter: Payer: Self-pay | Admitting: Adult Health

## 2018-11-29 ENCOUNTER — Other Ambulatory Visit: Payer: Self-pay

## 2018-11-29 DIAGNOSIS — R0602 Shortness of breath: Secondary | ICD-10-CM

## 2018-11-29 DIAGNOSIS — J4540 Moderate persistent asthma, uncomplicated: Secondary | ICD-10-CM

## 2018-11-29 DIAGNOSIS — J9611 Chronic respiratory failure with hypoxia: Secondary | ICD-10-CM | POA: Diagnosis not present

## 2018-11-29 DIAGNOSIS — I2782 Chronic pulmonary embolism: Secondary | ICD-10-CM

## 2018-11-29 NOTE — Assessment & Plan Note (Signed)
Referral to the weight loss center

## 2018-11-29 NOTE — Patient Instructions (Addendum)
Continue on Oxygen 2l/m with activity and At bedtime  . Continue after this Lasix 40mg  Twice daily   Low salt diet . Legs elevated .  Work on healthy weight loss .  Advance activity as tolerated  Check HRCT chest .  Continue Symbicort 2 puffs Twice daily  , rinse after use.  Refer to weight loss center .  Follow up with Dr. Elsworth Soho  Or Namrata Dangler NP in 6-8 weeks and As needed   Please contact office for sooner follow up if symptoms do not improve or worsen or seek emergency care

## 2018-11-29 NOTE — Assessment & Plan Note (Signed)
Suspect this is multifactorial with underlying morbid obesity deconditioning restrictive lung disease with asthma and morbid obesity We will check a high-resolution CT chest to rule out interstitial lung disease as he has interstitial prominence on his chest x-ray.  Suspect diastolic heart failure is good also contributing to his dyspnea.  Although he does not appear to be in volume overload.  Also need to consider the patient could have underlying pulmonary hypertension that is been undiagnosed as he has risk factors for his restrictive lung disease and untreated sleep apnea due to CPAP refusal.  Also he may have some risk factors for coronary disease unclear if he has had a cardiac stress test in the past.  May need referral to cardiology for further evaluation  Plan  Patient Instructions  Continue on Oxygen 2l/m with activity and At bedtime  . Continue after this Lasix 40mg  Twice daily   Low salt diet . Legs elevated .  Work on healthy weight loss .  Advance activity as tolerated  Check HRCT chest .  Continue Symbicort 2 puffs Twice daily  , rinse after use.  Refer to weight loss center .  Follow up with Dr. Elsworth Soho  Or Dorthula Bier NP in 6-8 weeks and As needed   Please contact office for sooner follow up if symptoms do not improve or worsen or seek emergency care

## 2018-11-29 NOTE — Assessment & Plan Note (Signed)
Cont on O2 with act and At bedtime   

## 2018-11-29 NOTE — Assessment & Plan Note (Signed)
Has minimal smoking history , seems to be controlled on Symbicort w/ no active wheezing   Plan  Patient Instructions  Continue on Oxygen 2l/m with activity and At bedtime  . Continue after this Lasix 40mg  Twice daily   Low salt diet . Legs elevated .  Work on healthy weight loss .  Advance activity as tolerated  Check HRCT chest .  Continue Symbicort 2 puffs Twice daily  , rinse after use.  Refer to weight loss center .  Follow up with Dr. Elsworth Soho  Or Nocholas Damaso NP in 6-8 weeks and As needed   Please contact office for sooner follow up if symptoms do not improve or worsen or seek emergency care

## 2018-11-29 NOTE — Assessment & Plan Note (Signed)
No mention of pulmonary HTN on echo but may need to consider RHC to evaluate as high risk for PAH with OSA untreated . And hx of PE

## 2018-11-29 NOTE — Progress Notes (Signed)
@Patient  ID: [name], male    DOB: Oct 30, 1940, 78 y.o.   MRN: [id number]  Chief Complaint  Patient presents with  . Follow-up    AB     Referring provider: Marin Olp, MD  HPI: 78 year old male former smoker (very minimal )  followed for recurrent pneumonia and asthmatic bronchitis Patient has severe obstructive sleep apnea with CPAP intolerance and declines further evaluation. PE April 2018 on anticoagulation  Medical history significant for previous pituitary tumor removal,A. Fib,renal cell carcinoma status post nephrectomy, Diastolic CHF    TEST/EVENTS :  CT chest June 08, 2017 showed a small left lower lobe area of consolidation.  PFT 07/2017 moderate restriction, DLCO 67%  CT chest October 2019 small to moderate bilateral pleural effusions consolidations within the left greater than right lower lobe and lingula. Hazy density in the left greater than right lower lobe.  2D echo February 04, 2018 showed EF 50 to 55%  03/2018 Autoimmune labs unremarkable   11/29/2018 Follow up : Asthmatic bronchitis, oxygen dependent respiratory failure Patient returns for a one-month follow-up.  Patient has underlying asthmatic bronchitis oxygen dependent respiratory failure and diastolic heart failure.  Patient was seen last visit with increased lower extremity swelling.  He was given 1 day of extra Lasix 40 mg.  Chest x-ray last visit showed chronic interstitial prominence.  Labs showed normal BNP and stable CBC except for elevated eosinophils and bmet Patient says since last visit he is doing about the same, biggest complaint is that he gets winded with activity and has low activity tolerance . No chest pain .  Denies significant cough or wheezing .  Not that active at home . Hard to do a lot  Looses breath .  On Oxygen 2l/m with activity and At bedtime  . Says it helps some .  Seen by PCP recently COVID 19 test negative  and labs unrevealing. CXR with chronic  interstitial changes.   In 2019 patient was hospitalized a couple times he had a prolonged hospitalization with MRSA pneumonia complicated by respiratory failure and sepsis and a prolonged rehab stay.  He also had decompensated heart failure with bilateral pleural effusions that required thoracentesis.  He did require physical therapy.  Was discharged home on 2 L of oxygen.   He has diastolic heart failure is on Lasix 40 mg twice daily.  Has chronic leg swelling.  Leg swelling is improved . No orthopnea. He has known severe sleep apnea but has declined CPAP in the past.  Patient education is given.  Retired , previous worked for [employer] (no known occupational exposure.)  No cigarette, occasional cigar -rare,. From local area .  No FH of pulmonary Fibrosis . No significant travel.      Allergies  Allergen Reactions  . Ambien [Zolpidem] Anxiety    Anxiety and agitation when tried as sleeper in hospital aug 2019    Immunization History  Administered Date(s) Administered  . Influenza Split 04/01/2011, 03/29/2012  . Influenza Whole 04/12/2007, 03/18/2009, 03/04/2010  . Influenza, High Dose Seasonal PF 04/03/2014, 04/04/2015, 04/24/2016, 04/12/2017, 04/26/2018  . Influenza,inj,Quad PF,6+ Mos 03/22/2013  . Influenza-Unspecified 05/29/2017  . Pneumococcal Conjugate-13 11/12/2014  . Pneumococcal Polysaccharide-23 06/09/2011  . Pneumococcal-Unspecified 06/30/2015  . Td 06/29/1994  . Tdap 06/09/2011  . Zoster 09/22/2007    Past Medical History:  Diagnosis Date  . Allergy    States his nose runs when he is around grease.   Marland Kitchen Anxiety   .  Arthritis   . Atrial fibrillation ([place])   . Cancer ([place])   . Coronary artery disease   . History of total knee replacement   . Hypertension   . Hypothyroidism   . Kidney cancer, primary, with metastasis from kidney to other site [facility])   . Kidney stone   . OSA (obstructive sleep apnea)    pt does not wear cpap at night  .  Pituitary cyst ([place])   . Pneumonia    hx of  . Prostate cancer ([place])     Tobacco History: Social History   Tobacco Use  Smoking Status Former Smoker  . Years: 4.00  . Types: Cigars  Smokeless Tobacco Never Used  Tobacco Comment   smoked cigars 1-2x per year; QUIT 2 YEARS AGO   2017   Counseling given: Not Answered Comment: smoked cigars 1-2x per year; QUIT 2 YEARS AGO   2017   Outpatient Medications Prior to Visit  Medication Sig Dispense Refill  . albuterol (PROVENTIL HFA;VENTOLIN HFA) 108 (90 Base) MCG/ACT inhaler Inhale 2 puffs into the lungs every 6 (six) hours as needed for wheezing or shortness of breath. 1 Inhaler 3  . apixaban (ELIQUIS) 5 MG TABS tablet Take 1 tablet (5 mg total) by mouth 2 (two) times daily. 60 tablet 11  . budesonide-formoterol (SYMBICORT) 80-4.5 MCG/ACT inhaler Inhale 2 puffs into the lungs 2 (two) times a day. 1 Inhaler 12  . busPIRone (BUSPAR) 5 MG tablet Take 1 tablet (5 mg total) by mouth 2 (two) times daily as needed (anxiety). 30 tablet 5  . colchicine 0.6 MG tablet Take 1 tablet (0.6 mg total) by mouth 2 (two) times daily. 60 tablet 0  . cyclobenzaprine (FLEXERIL) 10 MG tablet TAKE ONE TABLET BY MOUTH THREE TIMES A DAY AS NEEDED FOR MUSCLE SPASMS 30 tablet 0  . escitalopram (LEXAPRO) 20 MG tablet Take 1 tablet (20 mg total) by mouth daily. 90 tablet 3  . furosemide (LASIX) 40 MG tablet Take 1-2 tablets (40-80 mg total) by mouth 2 (two) times daily. 120 tablet 5  . levothyroxine (SYNTHROID, LEVOTHROID) 150 MCG tablet TAKE ONE TABLET BY MOUTH DAILY 90 tablet 1  . montelukast (SINGULAIR) 10 MG tablet Take 1 tablet (10 mg total) by mouth daily. 30 tablet 11  . Multiple Vitamins-Minerals (MULTIVITAMINS THER. W/MINERALS) TABS Take 1 tablet by mouth daily.    . nortriptyline (PAMELOR) 75 MG capsule TAKE ONE CAPSULE BY MOUTH EVERY NIGHT AT BEDTIME 90 capsule 1  . potassium chloride SA (K-DUR) 20 MEQ tablet TAKE ONE TABLET BY MOUTH TWO TIMES A DAY 120  tablet 1  . propranolol (INDERAL) 10 MG tablet TAKE ONE TABLET BY MOUTH TWICE A DAY 60 tablet 1  . traMADol (ULTRAM) 50 MG tablet Take 1 tablet (50 mg total) by mouth every 6 (six) hours as needed for moderate pain. 30 tablet 2  . traZODone (DESYREL) 50 MG tablet TAKE ONE-HALF TO 1 TABLET BY MOUTH AT BEDTIME AS NEEDED FOR SLEEP 90 tablet 3  . allopurinol (ZYLOPRIM) 100 MG tablet TAKE 1 TABLET (100 MG TOTAL) BY MOUTH DAILY (Patient not taking: Reported on 11/29/2018) 90 tablet 5   No facility-administered medications prior to visit.      Review of Systems:   Constitutional:   No  weight loss, night sweats,  Fevers, chills, fatigue, or  lassitude.  HEENT:   No headaches,  Difficulty swallowing,  Tooth/dental problems, or  Sore throat,  No sneezing, itching, ear ache, nasal congestion, post nasal drip,   CV:  No chest pain,  Orthopnea, PND, swelling in lower extremities, anasarca, dizziness, palpitations, syncope.   GI  No heartburn, indigestion, abdominal pain, nausea, vomiting, diarrhea, change in bowel habits, loss of appetite, bloody stools.   Resp: No shortness of breath with exertion or at rest.  No excess mucus, no productive cough,  No non-productive cough,  No coughing up of blood.  No change in color of mucus.  No wheezing.  No chest wall deformity  Skin: no rash or lesions.  GU: no dysuria, change in color of urine, no urgency or frequency.  No flank pain, no hematuria   MS:  No joint pain or swelling.  No decreased range of motion.  No back pain.    Physical Exam  BP 130/78 (BP Location: Left Arm, Cuff Size: Normal)   Pulse 91   Temp 97.8 F (36.6 C) (Oral)   Ht 5\' 11"  (1.803 m)   Wt 291 lb 9.6 oz (132.3 kg)   SpO2 96%   BMI 40.67 kg/m   GEN: A/Ox3; pleasant , NAD, well nourished    HEENT:  [place]/AT,  EACs-clear, TMs-wnl, NOSE-clear, THROAT-clear, no lesions, no postnasal drip or exudate noted.   NECK:  Supple w/ fair ROM; no JVD; normal carotid  impulses w/o bruits; no thyromegaly or nodules palpated; no lymphadenopathy.    RESP  Clear  P & A; w/o, wheezes/ rales/ or rhonchi. no accessory muscle use, no dullness to percussion  CARD:  RRR, no m/r/g, no peripheral edema, pulses intact, no cyanosis or clubbing.  GI:   Soft & nt; nml bowel sounds; no organomegaly or masses detected.   Musco: Warm bil, no deformities or joint swelling noted.   Neuro: alert, no focal deficits noted.    Skin: Warm, no lesions or rashes    Lab Results:  CBC    Component Value Date/Time   WBC 10.2 11/23/2018 1103   RBC 5.54 11/23/2018 1103   HGB 17.5 (H) 11/23/2018 1103   HGB 16.0 01/21/2018 0000   HCT 50.5 11/23/2018 1103   HCT 46.7 01/21/2018 0000   PLT 300.0 11/23/2018 1103   PLT 341 01/21/2018 0000   MCV 91.1 11/23/2018 1103   MCV 91 01/21/2018 0000   MCH 28.8 04/20/2018 0247   MCHC 34.7 11/23/2018 1103   RDW 15.5 11/23/2018 1103   RDW 14.4 01/21/2018 0000   LYMPHSABS 1.9 11/23/2018 1103   MONOABS 1.3 (H) 11/23/2018 1103   EOSABS 0.6 11/23/2018 1103   BASOSABS 0.1 11/23/2018 1103    BMET    Component Value Date/Time   NA 135 11/23/2018 1103   NA 136 01/21/2018 0000   K 4.4 11/23/2018 1103   CL 97 11/23/2018 1103   CO2 29 11/23/2018 1103   GLUCOSE 95 11/23/2018 1103   BUN 16 11/23/2018 1103   BUN 15 01/21/2018 0000   CREATININE 1.26 11/23/2018 1103   CREATININE 1.16 11/27/2016 1555   CALCIUM 9.1 11/23/2018 1103   GFRNONAA 60 (L) 04/20/2018 0247   GFRAA >60 04/20/2018 0247    BNP    Component Value Date/Time   BNP 178.0 (H) 04/14/2018 1808    ProBNP    Component Value Date/Time   PROBNP 23.0 11/23/2018 1103    Imaging: Dg Chest 2 View  Result Date: 11/23/2018 CLINICAL DATA:  Stable chronic interstitial changes. No active disease. EXAM: CHEST - 2 VIEW COMPARISON:  10/24/2018 FINDINGS: The heart size  and mediastinal contours are within normal limits. Both lungs are clear. The visualized skeletal structures are  unremarkable. IMPRESSION: Chronic interstitial prominence throughout the lungs, stable since prior study, most compatible with chronic interstitial lung disease. Heart is borderline in size. No effusions or acute opacities. No acute bony abnormality. Electronically Signed   By: Rolm Baptise M.D.   On: 11/23/2018 12:33      PFT Results Latest Ref Rng & Units 08/11/2017  FVC-Pre L 2.20  FVC-Predicted Pre % 54  FVC-Post L 2.16  FVC-Predicted Post % 53  Pre FEV1/FVC % % 79  Post FEV1/FCV % % 84  FEV1-Pre L 1.74  FEV1-Predicted Pre % 59  FEV1-Post L 1.81  DLCO UNC% % 67  DLCO COR %Predicted % 132  TLC L 4.77  TLC % Predicted % 69  RV % Predicted % 96    No results found for: NITRICOXIDE      Assessment & Plan:   Chronic respiratory failure with hypoxia (HCC) Cont on O2 with act and At bedtime    Asthmatic bronchitis Has minimal smoking history , seems to be controlled on Symbicort w/ no active wheezing   Plan  Patient Instructions  Continue on Oxygen 2l/m with activity and At bedtime  . Continue after this Lasix 40mg  Twice daily   Low salt diet . Legs elevated .  Work on healthy weight loss .  Advance activity as tolerated  Check HRCT chest .  Continue Symbicort 2 puffs Twice daily  , rinse after use.  Refer to weight loss center .  Follow up with Dr. Elsworth Soho  Or  NP in 6-8 weeks and As needed   Please contact office for sooner follow up if symptoms do not improve or worsen or seek emergency care          Chronic pulmonary embolism ([place]) No mention of pulmonary HTN on echo but may need to consider RHC to evaluate as high risk for PAH with OSA untreated . And hx of PE   Dyspnea Suspect this is multifactorial with underlying morbid obesity deconditioning restrictive lung disease with asthma and morbid obesity We will check a high-resolution CT chest to rule out interstitial lung disease as he has interstitial prominence on his chest x-ray.  Suspect diastolic  heart failure is good also contributing to his dyspnea.  Although he does not appear to be in volume overload.  Also need to consider the patient could have underlying pulmonary hypertension that is been undiagnosed as he has risk factors for his restrictive lung disease and untreated sleep apnea due to CPAP refusal.  Also he may have some risk factors for coronary disease unclear if he has had a cardiac stress test in the past.  May need referral to cardiology for further evaluation  Plan  Patient Instructions  Continue on Oxygen 2l/m with activity and At bedtime  . Continue after this Lasix 40mg  Twice daily   Low salt diet . Legs elevated .  Work on healthy weight loss .  Advance activity as tolerated  Check HRCT chest .  Continue Symbicort 2 puffs Twice daily  , rinse after use.  Refer to weight loss center .  Follow up with Dr. Elsworth Soho  Or  NP in 6-8 weeks and As needed   Please contact office for sooner follow up if symptoms do not improve or worsen or seek emergency care             Rexene Edison, NP 11/29/2018

## 2018-12-01 ENCOUNTER — Telehealth: Payer: Self-pay

## 2018-12-01 DIAGNOSIS — R0600 Dyspnea, unspecified: Secondary | ICD-10-CM

## 2018-12-01 NOTE — Telephone Encounter (Signed)
-----   Message from Marin Olp, MD sent at 11/29/2018  7:49 PM EDT ----- Lynelle Smoke- great idea on the cardiology input- he does have a lot of risk factors. Thanks for setting him up with CT to evaluate ILD>   Team- can you please convert this to a phone note and refer to cardiology under dyspnea?  Thanks,  [name] ----- Message ----- From: Melvenia Needles, NP Sent: 11/29/2018  11:23 AM EDT To: Marin Olp, MD  Dr. Yong Channel ,  I saw Mr. Vorndran today .  I going to order HRCT chest to make sure he does not have ILD .  Suspect his dyspnea is multifactoral (obesity, D CHF , Asthma , deconditioning) although  ILD is possibility with interstitial changes on CXR . Also probably not a bad idea to refer to cards as at risk for CAD , pulmonary htn (hx of pe and has OSA ).    Have a great day .   Tammy

## 2018-12-01 NOTE — Telephone Encounter (Signed)
Pt notified of update.  

## 2018-12-01 NOTE — Telephone Encounter (Signed)
-----   Message from Marin Olp, MD sent at 11/29/2018  7:49 PM EDT ----- Lynelle Smoke- great idea on the cardiology input- he does have a lot of risk factors. Thanks for setting him up with CT to evaluate ILD>   Team- can you please convert this to a phone note and refer to cardiology under dyspnea?  Thanks,  [name] ----- Message ----- From: Melvenia Needles, NP Sent: 11/29/2018  11:23 AM EDT To: Marin Olp, MD  Dr. Yong Channel ,  I saw Mr. Payette today .  I going to order HRCT chest to make sure he does not have ILD .  Suspect his dyspnea is multifactoral (obesity, D CHF , Asthma , deconditioning) although  ILD is possibility with interstitial changes on CXR . Also probably not a bad idea to refer to cards as at risk for CAD , pulmonary htn (hx of pe and has OSA ).    Have a great day .   Tammy

## 2018-12-03 DIAGNOSIS — G4733 Obstructive sleep apnea (adult) (pediatric): Secondary | ICD-10-CM | POA: Diagnosis not present

## 2018-12-24 ENCOUNTER — Other Ambulatory Visit: Payer: Self-pay | Admitting: Family Medicine

## 2018-12-26 ENCOUNTER — Other Ambulatory Visit: Payer: Self-pay | Admitting: Family Medicine

## 2018-12-29 ENCOUNTER — Other Ambulatory Visit: Payer: Self-pay | Admitting: Family Medicine

## 2018-12-29 NOTE — Telephone Encounter (Signed)
Last OV 11/17/18 Last refill 09/14/18 #30/0 Next OV not scheduled

## 2019-01-02 ENCOUNTER — Telehealth: Payer: Self-pay | Admitting: Family Medicine

## 2019-01-02 DIAGNOSIS — G4733 Obstructive sleep apnea (adult) (pediatric): Secondary | ICD-10-CM | POA: Diagnosis not present

## 2019-01-02 NOTE — Telephone Encounter (Signed)
Patient is calling to schedule CPE Please advise Cb- [phone]

## 2019-01-02 NOTE — Telephone Encounter (Signed)
Please schedule

## 2019-01-04 ENCOUNTER — Ambulatory Visit (INDEPENDENT_AMBULATORY_CARE_PROVIDER_SITE_OTHER)
Admission: RE | Admit: 2019-01-04 | Discharge: 2019-01-04 | Disposition: A | Discharge status: Home / Self Care | Payer: Medicare Other | Source: Ambulatory Visit | Attending: Adult Health | Admitting: Adult Health

## 2019-01-04 ENCOUNTER — Other Ambulatory Visit: Payer: Self-pay

## 2019-01-04 DIAGNOSIS — R0602 Shortness of breath: Secondary | ICD-10-CM | POA: Diagnosis not present

## 2019-01-06 ENCOUNTER — Other Ambulatory Visit: Payer: Self-pay | Admitting: *Deleted

## 2019-01-06 DIAGNOSIS — R0602 Shortness of breath: Secondary | ICD-10-CM

## 2019-01-06 DIAGNOSIS — R590 Localized enlarged lymph nodes: Secondary | ICD-10-CM

## 2019-01-06 NOTE — Progress Notes (Signed)
Ct with contrast ordered

## 2019-01-11 ENCOUNTER — Ambulatory Visit: Payer: Medicare Other | Admitting: Adult Health

## 2019-01-11 ENCOUNTER — Encounter: Payer: Self-pay | Admitting: Adult Health

## 2019-01-11 ENCOUNTER — Other Ambulatory Visit: Payer: Self-pay

## 2019-01-11 DIAGNOSIS — I5032 Chronic diastolic (congestive) heart failure: Secondary | ICD-10-CM | POA: Diagnosis not present

## 2019-01-11 DIAGNOSIS — J9611 Chronic respiratory failure with hypoxia: Secondary | ICD-10-CM

## 2019-01-11 DIAGNOSIS — J4540 Moderate persistent asthma, uncomplicated: Secondary | ICD-10-CM | POA: Diagnosis not present

## 2019-01-11 MED ORDER — AEROCHAMBER MV MISC: 0 refills | Status: DC

## 2019-01-11 MED ORDER — DULERA 200-5 MCG/ACT IN AERO: 1.0000 | INHALATION_SPRAY | Freq: Two times a day (BID) | RESPIRATORY_TRACT | 6 refills | Status: DC

## 2019-01-11 MED ORDER — BUDESONIDE-FORMOTEROL FUMARATE 80-4.5 MCG/ACT IN AERO: 2.0000 | INHALATION_SPRAY | Freq: Two times a day (BID) | RESPIRATORY_TRACT | 0 refills | Status: DC

## 2019-01-11 MED ORDER — SPACER/AERO-HOLDING CHAMBERS DEVI: 1.0000 | Freq: Once | 0 refills | Status: DC

## 2019-01-11 NOTE — Addendum Note (Signed)
Addended by: Valerie Salts on: 01/11/2019 12:00 PM   Modules accepted: Orders

## 2019-01-11 NOTE — Assessment & Plan Note (Signed)
Morbid obesity deconditioning sedentary lifestyle is contributing to his ongoing shortness of breath.  Needs help with weight loss. Weight loss clinic referral

## 2019-01-11 NOTE — Assessment & Plan Note (Signed)
On lifelong anticoagulation no obvious complications Continue Eliquis

## 2019-01-11 NOTE — Assessment & Plan Note (Signed)
Diastolic heart failure.  Does not appear to be an overt heart failure however has chronic lower extremity swelling. Suspect diastolic dysfunction is contributing his ongoing dyspnea as well. Interestingly echo in 2019 did not show any pulmonary hypertension as he would be high risk with his history of PE and untreated obstructive sleep apnea. Patient also does not appear to be taking his Lasix correctly.  Pharmacy team with extensive patient education on medication compliance.

## 2019-01-11 NOTE — Progress Notes (Signed)
@Patient  ID: Wayne Booth, male    DOB: September 11, 1940, 78 y.o.   MRN: [id number]  Chief Complaint  Patient presents with   Follow-up    Referring provider: Marin Olp, MD  HPI: 78 year old male former smoker (very minimal )  followed for recurrent pneumonia and asthmatic bronchitis Patient has severe obstructive sleep apnea with CPAP intolerance and declines further evaluation. PE April 2018 on anticoagulation  Medical history significant for previous pituitary tumor removal,A. Fib,renal cell carcinoma status post nephrectomy, Diastolic CHF   TEST/EVENTS :  CT chest June 08, 2017 showed a small left lower lobe area of consolidation.  PFT 07/2017 moderate restriction, DLCO 67%  CT chest October 2019 small to moderate bilateral pleural effusions consolidations within the left greater than right lower lobe and lingula. Hazy density in the left greater than right lower lobe.  2D echo February 04, 2018 showed EF 50 to 55%  03/2018 Autoimmune labs unremarkable   01/11/2019 Follow up :  Asthmatic Bronchitis and O2 RF  Patient returns for a 6-week follow-up.  Patient has multiple comorbidities.  He is followed for asthmatic bronchitis, oxygen dependent respiratory failure and diastolic heart failure. He has been having ongoing progressive shortness of breath with activity.  He was felt to have a component of volume overload and was given extra Lasix for 1 day.  Lab work showed a normal BNP.  CBC was stable except for elevated eosinophils.  Chest x-ray showed some chronic interstitial prominence.  Patient underwent a high-resolution CT chest that showed patchy parenchymal groundglass with interstitial coarsening bilaterally.  This was seen similar and 2019 from a multi lobar pneumonia.  Felt to be a possible component of postinflammatory changes.  Not consistent with UIP.  Borderline mediastinal adenopathy felt to be likely reactive.  A planned CT chest in 6 months has  been set up. I went over these test results with the patient and his family member. Previous pulmonary function testing in 2019 showed moderate restriction and a decreased diffusing capacity at 67%. Patient is supposed to be on Symbicort twice daily.  However admits that he has not taken this consistently.  He also is very unfamiliar with any of his medicines says that his wife gives him his medications.  When looking back through pharmacy records it does not appear that he is refilling his medications on a consistent basis.  Patient education was given by our pharmacy team with medication education,  formulary review.  Patient says he has no significant dyspnea at rest however gets very winded with minimal activity such as taking a shower.  He denies any cough or wheezing.  Has chronic leg swelling.  Patient is morbidly obese.  He has been referred to the weight loss clinic . He also has been referred to cardiology for ongoing dyspnea as he has multiple cardiac risk factors.    Allergies  Allergen Reactions   Ambien [Zolpidem] Anxiety    Anxiety and agitation when tried as sleeper in hospital aug 2019    Immunization History  Administered Date(s) Administered   Influenza Split 04/01/2011, 03/29/2012   Influenza Whole 04/12/2007, 03/18/2009, 03/04/2010   Influenza, High Dose Seasonal PF 04/03/2014, 04/04/2015, 04/24/2016, 04/12/2017, 04/26/2018   Influenza,inj,Quad PF,6+ Mos 03/22/2013   Influenza-Unspecified 05/29/2017   Pneumococcal Conjugate-13 11/12/2014   Pneumococcal Polysaccharide-23 06/09/2011   Pneumococcal-Unspecified 06/30/2015   Td 06/29/1994   Tdap 06/09/2011   Zoster 09/22/2007    Past Medical History:  Diagnosis Date   Allergy  States his nose runs when he is around grease.    Anxiety    Arthritis    Atrial fibrillation (HCC)    Cancer (HCC)    Coronary artery disease    History of total knee replacement    Hypertension    Hypothyroidism     Kidney cancer, primary, with metastasis from kidney to other site [facility])    Kidney stone    OSA (obstructive sleep apnea)    pt does not wear cpap at night   Pituitary cyst ([place])    Pneumonia    hx of   Prostate cancer ([place])     Tobacco History: Social History   Tobacco Use  Smoking Status Former Smoker   Years: 4.00   Types: Cigars  Smokeless Tobacco Never Used  Tobacco Comment   smoked cigars 1-2x per year; QUIT 2 YEARS AGO   2017   Counseling given: Not Answered Comment: smoked cigars 1-2x per year; QUIT 2 YEARS AGO   2017   Outpatient Medications Prior to Visit  Medication Sig Dispense Refill   albuterol (PROVENTIL HFA;VENTOLIN HFA) 108 (90 Base) MCG/ACT inhaler Inhale 2 puffs into the lungs every 6 (six) hours as needed for wheezing or shortness of breath. 1 Inhaler 3   apixaban (ELIQUIS) 5 MG TABS tablet Take 1 tablet (5 mg total) by mouth 2 (two) times daily. 60 tablet 11   budesonide-formoterol (SYMBICORT) 80-4.5 MCG/ACT inhaler Inhale 2 puffs into the lungs 2 (two) times a day. 1 Inhaler 12   busPIRone (BUSPAR) 5 MG tablet TAKE ONE TABLET BY MOUTH TWICE A DAY AS NEEDED ANXIETY 30 tablet 4   colchicine 0.6 MG tablet Take 1 tablet (0.6 mg total) by mouth 2 (two) times daily. 60 tablet 0   cyclobenzaprine (FLEXERIL) 10 MG tablet TAKE ONE TABLET BY MOUTH THREE TIMES A DAY AS NEEDED FOR MUSCLE SPASMS 30 tablet 0   escitalopram (LEXAPRO) 20 MG tablet Take 1 tablet (20 mg total) by mouth daily. 90 tablet 3   furosemide (LASIX) 40 MG tablet Take 1-2 tablets (40-80 mg total) by mouth 2 (two) times daily. 120 tablet 5   KLOR-CON M20 20 MEQ tablet TAKE ONE TABLET BY MOUTH TWICE A DAY 120 tablet 0   levothyroxine (SYNTHROID, LEVOTHROID) 150 MCG tablet TAKE ONE TABLET BY MOUTH DAILY 90 tablet 1   montelukast (SINGULAIR) 10 MG tablet Take 1 tablet (10 mg total) by mouth daily. 30 tablet 11   Multiple Vitamins-Minerals (MULTIVITAMINS THER. W/MINERALS) TABS Take 1  tablet by mouth daily.     nortriptyline (PAMELOR) 75 MG capsule TAKE ONE CAPSULE BY MOUTH EVERY NIGHT AT BEDTIME 90 capsule 1   propranolol (INDERAL) 10 MG tablet TAKE ONE TABLET BY MOUTH TWICE A DAY 60 tablet 1   traMADol (ULTRAM) 50 MG tablet Take 1 tablet (50 mg total) by mouth every 6 (six) hours as needed for moderate pain. 30 tablet 2   traZODone (DESYREL) 50 MG tablet TAKE ONE-HALF TO 1 TABLET BY MOUTH AT BEDTIME AS NEEDED FOR SLEEP 90 tablet 3   No facility-administered medications prior to visit.      Review of Systems:   Constitutional:   No  weight loss, night sweats,  Fevers, chills,  +fatigue, or  lassitude.  HEENT:   No headaches,  Difficulty swallowing,  Tooth/dental problems, or  Sore throat,                No sneezing, itching, ear ache, nasal congestion, post nasal  drip,   CV:  No chest pain,  Orthopnea, PND, +swelling in lower extremities,  No anasarca, dizziness, palpitations, syncope.   GI  No heartburn, indigestion, abdominal pain, nausea, vomiting, diarrhea, change in bowel habits, loss of appetite, bloody stools.   Resp:    No chest wall deformity  Skin: no rash or lesions.  GU: no dysuria, change in color of urine, no urgency or frequency.  No flank pain, no hematuria   MS:  No joint pain or swelling.  No decreased range of motion.  No back pain.    Physical Exam  BP 110/70 (BP Location: Right Arm, Patient Position: Sitting, Cuff Size: Large)    Pulse 81    Temp 98.5 F (36.9 C) (Oral)    Ht 5\' 11"  (1.803 m)    Wt 294 lb 6.4 oz (133.5 kg)    SpO2 97%    BMI 41.06 kg/m   GEN: A/Ox3; pleasant , NAD, elderly , morbidly obese    HEENT:  [place]/AT,  , NOSE-clear, THROAT-clear, no lesions, no postnasal drip or exudate noted.   NECK:  Supple w/ fair ROM; no JVD; normal carotid impulses w/o bruits; no thyromegaly or nodules palpated; no lymphadenopathy.    RESP  Clear  P & A; w/o, wheezes/ rales/ or rhonchi. no accessory muscle use, no dullness to  percussion  CARD:  RRR, no m/r/g, 1+peripheral edema, pulses intact, no cyanosis or clubbing.  GI:   Soft & nt; nml bowel sounds; no organomegaly or masses detected.   Musco: Warm bil, no deformities or joint swelling noted.   Neuro: alert, no focal deficits noted.    Skin: Warm, no lesions or rashes    Lab Results:  CBC    Component Value Date/Time   WBC 10.2 11/23/2018 1103   RBC 5.54 11/23/2018 1103   HGB 17.5 (H) 11/23/2018 1103   HGB 16.0 01/21/2018 0000   HCT 50.5 11/23/2018 1103   HCT 46.7 01/21/2018 0000   PLT 300.0 11/23/2018 1103   PLT 341 01/21/2018 0000   MCV 91.1 11/23/2018 1103   MCV 91 01/21/2018 0000   MCH 28.8 04/20/2018 0247   MCHC 34.7 11/23/2018 1103   RDW 15.5 11/23/2018 1103   RDW 14.4 01/21/2018 0000   LYMPHSABS 1.9 11/23/2018 1103   MONOABS 1.3 (H) 11/23/2018 1103   EOSABS 0.6 11/23/2018 1103   BASOSABS 0.1 11/23/2018 1103    BMET    Component Value Date/Time   NA 135 11/23/2018 1103   NA 136 01/21/2018 0000   K 4.4 11/23/2018 1103   CL 97 11/23/2018 1103   CO2 29 11/23/2018 1103   GLUCOSE 95 11/23/2018 1103   BUN 16 11/23/2018 1103   BUN 15 01/21/2018 0000   CREATININE 1.26 11/23/2018 1103   CREATININE 1.16 11/27/2016 1555   CALCIUM 9.1 11/23/2018 1103   GFRNONAA 60 (L) 04/20/2018 0247   GFRAA >60 04/20/2018 0247    BNP    Component Value Date/Time   BNP 178.0 (H) 04/14/2018 1808    ProBNP    Component Value Date/Time   PROBNP 23.0 11/23/2018 1103    Imaging: Ct Chest High Resolution  Result Date: 01/04/2019 CLINICAL DATA:  Increasing shortness of breath exertion. EXAM: CT CHEST WITHOUT CONTRAST TECHNIQUE: Multidetector CT imaging of the chest was performed following the standard protocol without intravenous contrast. High resolution imaging of the lungs, as well as inspiratory and expiratory imaging, was performed. COMPARISON:  04/15/2018, 819 and 10/20/2016. FINDINGS: Cardiovascular: Atherosclerotic calcification  of the  aorta, aortic valve and coronary arteries. Heart is enlarged. No pericardial effusion. Mediastinum/Nodes: Mediastinal lymph nodes measure up to 1.4 cm in the AP window, slightly enlarged from 1.1 cm on 04/15/2018. Hilar regions are difficult to evaluate without IV contrast. No axillary adenopathy. Esophagus is grossly unremarkable. Lungs/Pleura: There is patchy parenchymal ground-glass with interstitial coarsening bilaterally. Findings are seen in a similar distribution to that of multilobar pneumonia seen on 02/03/2018. Minimal architectural distortion. No definite honeycombing. No air trapping. Tiny left pleural effusion. Airway is unremarkable. Upper Abdomen: Visualized portions of the liver, right adrenal gland, spleen, pancreas, stomach and bowel are grossly unremarkable. Cholecystectomy. No upper abdominal adenopathy. Musculoskeletal: Degenerative changes in the spine. No worrisome lytic or sclerotic lesions. IMPRESSION: 1. Pulmonary parenchymal pattern of fibrosis is likely postinflammatory in etiology, when compared with 02/03/2018. Findings are suggestive of an alternative diagnosis (not UIP) per consensus guidelines: Diagnosis of Idiopathic Pulmonary Fibrosis: An Official ATS/ERS/JRS/ALAT Clinical Practice Guideline. [place], Iss 5, [phone], Feb 27 2017. 2. Tiny left pleural effusion. 3. Borderline mediastinal adenopathy, likely reactive. 4. Aortic atherosclerosis (ICD10-170.0). Coronary artery calcification. Electronically Signed   By: Lorin Picket M.D.   On: 01/04/2019 12:48      PFT Results Latest Ref Rng & Units 08/11/2017  FVC-Pre L 2.20  FVC-Predicted Pre % 54  FVC-Post L 2.16  FVC-Predicted Post % 53  Pre FEV1/FVC % % 79  Post FEV1/FCV % % 84  FEV1-Pre L 1.74  FEV1-Predicted Pre % 59  FEV1-Post L 1.81  DLCO UNC% % 67  DLCO COR %Predicted % 132  TLC L 4.77  TLC % Predicted % 69  RV % Predicted % 96    No results found for:  NITRICOXIDE      Assessment & Plan:   Asthmatic bronchitis Chronic asthmatic bronchitis -no active cough or congestion however has ongoing shortness of breath.  And has not compliant with his medications.  Extensive patient education was given today.  Our pharmacy team is looking into his formulary and possible cost components.  Plan  Patient Instructions  Continue on Oxygen 2l/m with activity and At bedtime  . Continue Lasix 40mg  Twice daily   Low salt diet . Legs elevated .  Work on healthy weight loss .  Advance activity as tolerated  Continue Symbicort 2 puffs Twice daily  , rinse after use.  Refer to weight loss center .  Follow up with Dr. Elsworth Soho  Or Marlyn Rabine NP in 3 months and As needed   Please contact office for sooner follow up if symptoms do not improve or worsen or seek emergency care        '  Morbid obesity ([place]) Morbid obesity deconditioning sedentary lifestyle is contributing to his ongoing shortness of breath.  Needs help with weight loss. Weight loss clinic referral   OSA (obstructive sleep apnea) Very severe sleep apnea intolerant of CPAP.  Declines CPAP or discussion of treatment options. Unfortunately this contributes to his ongoing medical issues  Chronic pulmonary embolism (HCC) On lifelong anticoagulation no obvious complications Continue Eliquis  (HFpEF) heart failure with preserved ejection fraction (HCC) Diastolic heart failure.  Does not appear to be an overt heart failure however has chronic lower extremity swelling. Suspect diastolic dysfunction is contributing his ongoing dyspnea as well. Interestingly echo in 2019 did not show any pulmonary hypertension as he would be high risk with his history of PE and untreated obstructive sleep apnea. Patient also does  not appear to be taking his Lasix correctly.  Pharmacy team with extensive patient education on medication compliance.   Chronic respiratory failure with hypoxia (HCC) Continue on oxygen  2 L at bedtime and with activity.     Rexene Edison, NP 01/11/2019

## 2019-01-11 NOTE — Progress Notes (Signed)
Patient was seen today in a co-visit. Patient education was provided with Beckey Rutter, PharmD, Candidate (agree with her documentation as included in the note). Patient did report cost challenge with Symbicort (budesonide-formoterol), copay around $75. Spoke to [employer], pharmacist states patient is in the [employer] "donut hole", and will have a high copay on brand name medications until out of the donut hole. Will refer to [facility] for further assistance.  See documentation under Tammy Parrett's visit for details.

## 2019-01-11 NOTE — Addendum Note (Signed)
Addended by: Valerie Salts on: 01/11/2019 12:19 PM   Modules accepted: Orders

## 2019-01-11 NOTE — Assessment & Plan Note (Signed)
Very severe sleep apnea intolerant of CPAP.  Declines CPAP or discussion of treatment options. Unfortunately this contributes to his ongoing medical issues

## 2019-01-11 NOTE — Progress Notes (Signed)
Symbicort was reviewed with the patient, including name, instructions, indication, goals of therapy, potential side effects, importance of adherence, and safe use, including rinsing out mouth after use.  Patient reports challenges understanding medications. His wife manages his medications and she is not present today. He is only takes Symbicort when he feels short of breath, but we instructed him to take 2 puffs BID. Patient has difficulty coordinating breath with pushing button on inhaler. We gave him a spacer to help get his doses. He was also taking 2 doses back to back, but we explained to wait a minute between doses.  Patient verbalized understanding by repeating back information and was advised to contact me if medication-related questions arise. Patient was also provided an information handout.

## 2019-01-11 NOTE — Patient Instructions (Addendum)
Continue on Oxygen 2l/m with activity and At bedtime  . Continue Lasix 40mg  Twice daily   Low salt diet . Legs elevated .  Work on healthy weight loss .  Advance activity as tolerated  Continue Symbicort 2 puffs Twice daily  , rinse after use.  Refer to weight loss center .  Follow up with Dr. Elsworth Soho  Or Parrett NP in 3 months and As needed   Please contact office for sooner follow up if symptoms do not improve or worsen or seek emergency care

## 2019-01-11 NOTE — Assessment & Plan Note (Signed)
Continue on oxygen 2 L at bedtime and with activity.

## 2019-01-11 NOTE — Assessment & Plan Note (Signed)
Chronic asthmatic bronchitis -no active cough or congestion however has ongoing shortness of breath.  And has not compliant with his medications.  Extensive patient education was given today.  Our pharmacy team is looking into his formulary and possible cost components.  Plan  Patient Instructions  Continue on Oxygen 2l/m with activity and At bedtime  . Continue Lasix 40mg  Twice daily   Low salt diet . Legs elevated .  Work on healthy weight loss .  Advance activity as tolerated  Continue Symbicort 2 puffs Twice daily  , rinse after use.  Refer to weight loss center .  Follow up with Dr. Elsworth Soho  Or Parrett NP in 3 months and As needed   Please contact office for sooner follow up if symptoms do not improve or worsen or seek emergency care        '

## 2019-01-13 ENCOUNTER — Other Ambulatory Visit: Payer: Self-pay | Admitting: *Deleted

## 2019-01-13 NOTE — Patient Outreach (Signed)
[place] [facility]) Care Management  01/13/2019  Wayne Booth 1940/09/18 [id number]   Subjective: Telephone call to patient's home  / mobile number, no answer, left HIPAA compliant voicemail message, and requested call back.   Objective: Per KPN (Knowledge Performance Now, point of care tool) and chart review, patient has had no recent hospitalizations or ED visits.   Patient also has a history of chronic diastolic congestive heart failure, hypertension, asthmatic bronchitis, hypothyroidism, CAD, chronic PE, renal cell cancer with status post nephrectomy, prostate cancer, OSA with CPAP intolerance, Pituitary cyst, Atrial fibrillation, and pneumonia.       Assessment: Received [employer] MD referral on 01/11/2019.   Referral source: Tammy Parrett Nurse Practitioner at pulmonologist office.   Referral reason: patient assistance for Symbicort / albuterol inhalers, diagnosis COPD, pneumonia, asthmatic bronchitis.    Screening  follow up pending patient contact.      Plan: RNCM will send unsuccessful outreach  letter, [facility] pamphlet, will call patient for 2nd telephone outreach attempt within 4 business days, screening follow up, and will proceed with case closure within 10 business days if no return call.     Wayne Booth H. Annia Friendly, BSN, [place] Management [facility] Telephonic CM Phone: [phone] Fax: [phone]

## 2019-01-16 ENCOUNTER — Other Ambulatory Visit: Payer: Self-pay | Admitting: *Deleted

## 2019-01-16 NOTE — Patient Outreach (Signed)
[place] [facility]) Care Management  01/16/2019  Wayne Booth 09-May-1941 [id number]   Subjective: Telephone call to patient's home  / mobile number, no answer, left HIPAA compliant voicemail message, and requested call back.   Objective: Per KPN (Knowledge Performance Now, point of care tool) and chart review, patient has had no recent hospitalizations or ED visits.   Patient also has a history of chronic diastolic congestive heart failure, hypertension, asthmatic bronchitis, hypothyroidism, CAD, chronic PE, renal cell cancer with status post nephrectomy, prostate cancer, OSA with CPAP intolerance, Pituitary cyst, Atrial fibrillation, and pneumonia.       Assessment: Received [employer] MD referral on 01/11/2019.   Referral source: Tammy Parrett Nurse Practitioner at pulmonologist office.   Referral reason: patient assistance for Symbicort / albuterol inhalers, diagnosis COPD, pneumonia, asthmatic bronchitis.    Screening  follow up pending patient contact.      Plan: RNCM has sent unsuccessful outreach  letter, [facility] pamphlet, will call patient for 3rd telephone outreach attempt within 4 business days, screening follow up, and will proceed with case closure within 10 business days if no return call.     Wayne Booth H. Annia Friendly, BSN, [place] Management [facility] Telephonic CM Phone: [phone] Fax: [phone]

## 2019-01-17 ENCOUNTER — Other Ambulatory Visit: Payer: Self-pay | Admitting: *Deleted

## 2019-01-17 NOTE — Patient Outreach (Signed)
[facility]) Care Management  01/17/2019  Wayne Booth 05/05/1941 [id number]   Subjective: Telephone call to patient's home  / mobile number, spoke with male answering phone, states Wayne Booth is currently driving,  requested caller hang up, and text with call back number.    Sent the following HIPAA compliant message to patient's home / mobile number: Call back number per your request [phone].      Objective:Per KPN (Knowledge Performance Now, point of care tool) and chart review,patient has had no recent hospitalizations or ED visits. Patient also has a history of chronic diastolic congestive heart failure, hypertension, asthmatic bronchitis, hypothyroidism, CAD, chronic PE, renal cell cancer with status post nephrectomy, prostate cancer, OSA with CPAP intolerance, Pituitary cyst,Atrial fibrillation, and pneumonia.     Assessment: Received [employer] MD referral on 01/11/2019. Referral source: Tammy Parrett Nurse Practitioner at pulmonologist office. Referral reason: patient assistance for Symbicort / albuterol inhalers, diagnosis COPD, pneumonia, asthmatic bronchitis. Screening follow up pending patient contact.      Plan:RNCM has sent unsuccessful outreach letter, [facility] pamphlet, and will proceed with case closure within 10 business days if no return call.     Taisa Deloria H. Annia Friendly, BSN, [place] Management [facility] Telephonic CM Phone: [phone] Fax: [phone]

## 2019-01-18 ENCOUNTER — Other Ambulatory Visit: Payer: Self-pay | Admitting: Family Medicine

## 2019-01-19 NOTE — Telephone Encounter (Signed)
Last OV 11/17/2018 Last refill 12/29/2018 #30/0 Next OV 02/21/19

## 2019-01-20 ENCOUNTER — Ambulatory Visit (INDEPENDENT_AMBULATORY_CARE_PROVIDER_SITE_OTHER): Payer: Medicare Other

## 2019-01-20 ENCOUNTER — Ambulatory Visit (INDEPENDENT_AMBULATORY_CARE_PROVIDER_SITE_OTHER): Payer: Medicare Other | Admitting: Podiatry

## 2019-01-20 ENCOUNTER — Other Ambulatory Visit: Payer: Self-pay

## 2019-01-20 ENCOUNTER — Encounter: Payer: Self-pay | Admitting: Podiatry

## 2019-01-20 DIAGNOSIS — M779 Enthesopathy, unspecified: Secondary | ICD-10-CM

## 2019-01-20 DIAGNOSIS — M2022 Hallux rigidus, left foot: Secondary | ICD-10-CM

## 2019-01-20 DIAGNOSIS — M2021 Hallux rigidus, right foot: Secondary | ICD-10-CM | POA: Diagnosis not present

## 2019-01-20 DIAGNOSIS — G629 Polyneuropathy, unspecified: Secondary | ICD-10-CM

## 2019-01-20 MED ORDER — GABAPENTIN 100 MG PO CAPS: 100.0000 mg | ORAL_CAPSULE | Freq: Every day | ORAL | 0 refills | Status: DC

## 2019-01-20 NOTE — Patient Instructions (Signed)
Gabapentin capsules or tablets What is this medicine? GABAPENTIN (GA ba pen tin) is used to control seizures in certain types of epilepsy. It is also used to treat certain types of nerve pain. This medicine may be used for other purposes; ask your health care provider or pharmacist if you have questions. COMMON BRAND NAME(S): Active-PAC with Gabapentin, Gabarone, Neurontin What should I tell my health care provider before I take this medicine? They need to know if you have any of these conditions:  history of drug abuse or alcohol abuse problem  kidney disease  lung or breathing disease  suicidal thoughts, plans, or attempt; a previous suicide attempt by you or a family member  an unusual or allergic reaction to gabapentin, other medicines, foods, dyes, or preservatives  pregnant or trying to get pregnant  breast-feeding How should I use this medicine? Take this medicine by mouth with a glass of water. Follow the directions on the prescription label. You can take it with or without food. If it upsets your stomach, take it with food. Take your medicine at regular intervals. Do not take it more often than directed. Do not stop taking except on your doctor's advice. If you are directed to break the 600 or 800 mg tablets in half as part of your dose, the extra half tablet should be used for the next dose. If you have not used the extra half tablet within 28 days, it should be thrown away. A special MedGuide will be given to you by the pharmacist with each prescription and refill. Be sure to read this information carefully each time. Talk to your pediatrician regarding the use of this medicine in children. While this drug may be prescribed for children as young as 3 years for selected conditions, precautions do apply. Overdosage: If you think you have taken too much of this medicine contact a poison control center or emergency room at once. NOTE: This medicine is only for you. Do not share this  medicine with others. What if I miss a dose? If you miss a dose, take it as soon as you can. If it is almost time for your next dose, take only that dose. Do not take double or extra doses. What may interact with this medicine? This medicine may interact with the following medications:  alcohol  antihistamines for allergy, cough, and cold  certain medicines for anxiety or sleep  certain medicines for depression like amitriptyline, fluoxetine, sertraline  certain medicines for seizures like phenobarbital, primidone  certain medicines for stomach problems  general anesthetics like halothane, isoflurane, methoxyflurane, propofol  local anesthetics like lidocaine, pramoxine, tetracaine  medicines that relax muscles for surgery  narcotic medicines for pain  phenothiazines like chlorpromazine, mesoridazine, prochlorperazine, thioridazine This list may not describe all possible interactions. Give your health care provider a list of all the medicines, herbs, non-prescription drugs, or dietary supplements you use. Also tell them if you smoke, drink alcohol, or use illegal drugs. Some items may interact with your medicine. What should I watch for while using this medicine? Visit your doctor or health care provider for regular checks on your progress. You may want to keep a record at home of how you feel your condition is responding to treatment. You may want to share this information with your doctor or health care provider at each visit. You should contact your doctor or health care provider if your seizures get worse or if you have any new types of seizures. Do not stop taking   this medicine or any of your seizure medicines unless instructed by your doctor or health care provider. Stopping your medicine suddenly can increase your seizures or their severity. This medicine may cause serious skin reactions. They can happen weeks to months after starting the medicine. Contact your health care  provider right away if you notice fevers or flu-like symptoms with a rash. The rash may be red or purple and then turn into blisters or peeling of the skin. Or, you might notice a red rash with swelling of the face, lips or lymph nodes in your neck or under your arms. Wear a medical identification bracelet or chain if you are taking this medicine for seizures, and carry a card that lists all your medications. You may get drowsy, dizzy, or have blurred vision. Do not drive, use machinery, or do anything that needs mental alertness until you know how this medicine affects you. To reduce dizzy or fainting spells, do not sit or stand up quickly, especially if you are an older patient. Alcohol can increase drowsiness and dizziness. Avoid alcoholic drinks. Your mouth may get dry. Chewing sugarless gum or sucking hard candy, and drinking plenty of water will help. The use of this medicine may increase the chance of suicidal thoughts or actions. Pay special attention to how you are responding while on this medicine. Any worsening of mood, or thoughts of suicide or dying should be reported to your health care provider right away. Women who become pregnant while using this medicine may enroll in the [place] American Antiepileptic Drug Pregnancy Registry by calling 1-888-233-2334. This registry collects information about the safety of antiepileptic drug use during pregnancy. What side effects may I notice from receiving this medicine? Side effects that you should report to your doctor or health care professional as soon as possible:  allergic reactions like skin rash, itching or hives, swelling of the face, lips, or tongue  breathing problems  rash, fever, and swollen lymph nodes  redness, blistering, peeling or loosening of the skin, including inside the mouth  suicidal thoughts, mood changes Side effects that usually do not require medical attention (report to your doctor or health care professional if they  continue or are bothersome):  dizziness  drowsiness  headache  nausea, vomiting  swelling of ankles, feet, hands  tiredness This list may not describe all possible side effects. Call your doctor for medical advice about side effects. You may report side effects to FDA at 1-800-FDA-1088. Where should I keep my medicine? Keep out of reach of children. This medicine may cause accidental overdose and death if it taken by other adults, children, or pets. Mix any unused medicine with a substance like cat litter or coffee grounds. Then throw the medicine away in a sealed container like a sealed bag or a coffee can with a lid. Do not use the medicine after the expiration date. Store at room temperature between 15 and 30 degrees C (59 and 86 degrees F). NOTE: This sheet is a summary. It may not cover all possible information. If you have questions about this medicine, talk to your doctor, pharmacist, or health care provider.  2020 Elsevier/Gold Standard (2018-09-16 14:16:43)  

## 2019-01-23 ENCOUNTER — Ambulatory Visit (INDEPENDENT_AMBULATORY_CARE_PROVIDER_SITE_OTHER): Payer: Medicare Other | Admitting: Primary Care

## 2019-01-23 ENCOUNTER — Telehealth: Payer: Self-pay | Admitting: Adult Health

## 2019-01-23 ENCOUNTER — Other Ambulatory Visit: Payer: Self-pay

## 2019-01-23 ENCOUNTER — Encounter: Payer: Self-pay | Admitting: Primary Care

## 2019-01-23 DIAGNOSIS — R0602 Shortness of breath: Secondary | ICD-10-CM | POA: Diagnosis not present

## 2019-01-23 DIAGNOSIS — J4540 Moderate persistent asthma, uncomplicated: Secondary | ICD-10-CM

## 2019-01-23 MED ORDER — ALBUTEROL SULFATE HFA 108 (90 BASE) MCG/ACT IN AERS: 2.0000 | INHALATION_SPRAY | Freq: Four times a day (QID) | RESPIRATORY_TRACT | 2 refills | Status: DC | PRN

## 2019-01-23 MED ORDER — PREDNISONE 10 MG PO TABS: ORAL_TABLET | ORAL | 0 refills | Status: DC

## 2019-01-23 NOTE — Patient Instructions (Addendum)
Acute asthma exacerbation - RX prednisone taper (40mg  x 2 days; 30mg  x 2 days; 20mg  x 2 days; 10mg  x 2 days) - Continue Symbicort 80 two puffs twice daily  - Albuterol hfa 2 puffs every 4-6 hours for sob/wheezing (refill sent)  - Labs 7/28  Follow-up Please return if symptoms do not improve or worsen

## 2019-01-23 NOTE — Telephone Encounter (Signed)
Schedule televisit or video visit with Wayne Booth or [place]

## 2019-01-23 NOTE — Telephone Encounter (Signed)
Returned call to patient with provider recommendation to schedule tele or video visit. Patient does not use mychart video.  Televisit scheduled for today at 3:00pm to discuss shortness of breath.  Patient acknowledged understanding we would call him at approximately 3pm.  Patient is available earlier if provider preference.  Nothing further needed.

## 2019-01-23 NOTE — Progress Notes (Signed)
Virtual Visit via Telephone Note  I connected with Wayne Booth on 01/23/19 at  3:00 PM EDT by telephone and verified that I am speaking with the correct person using two identifiers.  Location: Patient: Home Provider: Office   I discussed the limitations, risks, security and privacy concerns of performing an evaluation and management service by telephone and the availability of in person appointments. I also discussed with the patient that there may be a patient responsible charge related to this service. The patient expressed understanding and agreed to proceed.   History of Present Illness: 78 year old male, minimal former smoker. PMH significant for OSA, pleural effusion, asthmatic bronchitis, chronic resp failure. Patient of Dr. Elsworth Soho, last seen by pulmonary NP on 10/24/18.   01/23/2019 Patient contacted today for televisit, reports increased shortness of breath x 1-2 weeks. Dyspnea is much worse on exertion. Associated chest tightness and occasional wheezing. He is not aware of his medications, states that his wife manages them and he has been taking all as prescribed. Reports that he has been using his Symbicort inhaler three times a day. Reports that his weight is baseline. Feels his legs look more swollen. Confirmed with his wife that he is taking diuretic. O2 92-95% 2L. Denies fever, cough or sick contact.   Observations/Objective:  - Moderate dyspnea with exertion, slight wheeze  Assessment and Plan:  Acute asthma exacerbation - SOB x 1-2 weeks with associated chest tightness/wheezing - RX prednisone taper (40mg  x 2 days; 30mg  x 2 days; 20mg  x 2 days; 10mg  x 2 days) - Continue Symbicort 80 two puffs twice daily  - Albuterol hfa 2 puffs every 4-6 hours for sob/wheezing (refill sent)  - Checking BNP, cbc with diff, d-dimer  Follow Up Instructions: Please return if symptoms do not improve or worsen   I discussed the assessment and treatment plan with the patient. The patient  was provided an opportunity to ask questions and all were answered. The patient agreed with the plan and demonstrated an understanding of the instructions.   The patient was advised to call back or seek an in-person evaluation if the symptoms worsen or if the condition fails to improve as anticipated.  I provided 15 minutes of non-face-to-face time during this encounter.   Martyn Ehrich, NP

## 2019-01-23 NOTE — Telephone Encounter (Signed)
Primary Pulmonologist: RA Last office visit and with whom: 01/11/2019 with TP What do we see them for (pulmonary problems): asthma/morbid obesity Last OV assessment/plan: Instructions    Return in about 3 months (around 04/13/2019) for Follow up with Dr. Elsworth Soho. Continue on Oxygen 2l/m with activity and At bedtime  . Continue Lasix 40mg  Twice daily   Low salt diet . Legs elevated .  Work on healthy weight loss .  Advance activity as tolerated  Continue Symbicort 2 puffs Twice daily  , rinse after use.  Refer to weight loss center .  Follow up with Dr. Elsworth Soho  Or Parrett NP in 3 months and As needed   Please contact office for sooner follow up if symptoms do not improve or worsen or seek emergency care      Was appointment offered to patient (explain)?  Pt wants something prescribed   Reason for call: called and spoke with pt who stated his SOB started getting worse 1.5 weeks ago and is just continuing to get worse. Pt is having to use rescue inhaler at least 5 times daily and is also still using his symbicort as prescribed.  Pt does wear 2L O2 and states he wears it only when he is resting. Pt said that sats are ranging between 92-95%.  Pt denies any complaints of cough but states that he does have tightness in chest.  Pt denies any complaints of fever. Pt was tested for COVID 5/21 and results were negative.  Pt is requesting to have something prescribed to help with his symptoms. Sarah, please advise recommendations for pt. Thanks!

## 2019-01-24 ENCOUNTER — Other Ambulatory Visit: Payer: Self-pay | Admitting: Family Medicine

## 2019-01-24 ENCOUNTER — Other Ambulatory Visit: Payer: Self-pay

## 2019-01-24 ENCOUNTER — Other Ambulatory Visit (INDEPENDENT_AMBULATORY_CARE_PROVIDER_SITE_OTHER): Payer: Medicare Other

## 2019-01-24 DIAGNOSIS — R0602 Shortness of breath: Secondary | ICD-10-CM | POA: Diagnosis not present

## 2019-01-24 LAB — CBC WITH DIFFERENTIAL/PLATELET
Basophils Absolute: 0.1 10*3/uL (ref 0.0–0.1)
Basophils Relative: 0.8 % (ref 0.0–3.0)
Eosinophils Absolute: 0.5 10*3/uL (ref 0.0–0.7)
Eosinophils Relative: 6.2 % — ABNORMAL HIGH (ref 0.0–5.0)
HCT: 49.8 % (ref 39.0–52.0)
Hemoglobin: 16.7 g/dL (ref 13.0–17.0)
Lymphocytes Relative: 18.9 % (ref 12.0–46.0)
Lymphs Abs: 1.7 10*3/uL (ref 0.7–4.0)
MCHC: 33.6 g/dL (ref 30.0–36.0)
MCV: 93.3 fl (ref 78.0–100.0)
Monocytes Absolute: 0.9 10*3/uL (ref 0.1–1.0)
Monocytes Relative: 10.1 % (ref 3.0–12.0)
Neutro Abs: 5.7 10*3/uL (ref 1.4–7.7)
Neutrophils Relative %: 64 % (ref 43.0–77.0)
Platelets: 340 10*3/uL (ref 150.0–400.0)
RBC: 5.34 Mil/uL (ref 4.22–5.81)
RDW: 15 % (ref 11.5–15.5)
WBC: 8.8 10*3/uL (ref 4.0–10.5)

## 2019-01-24 LAB — BRAIN NATRIURETIC PEPTIDE: Pro B Natriuretic peptide (BNP): 27 pg/mL (ref 0.0–100.0)

## 2019-01-24 LAB — D-DIMER, QUANTITATIVE: D-Dimer, Quant: 0.8 mcg/mL FEU — ABNORMAL HIGH (ref <0.50)

## 2019-01-24 NOTE — Progress Notes (Signed)
Please let patient know his BNP was normal. No need to increase diuretic right now. Ddimer was slightly elevated but I have low suspicion for PE because he is already on a blood thinner. Continue steroid course. Please make sure he follow up with me in 1 week telelvisit.

## 2019-01-25 ENCOUNTER — Other Ambulatory Visit: Payer: Self-pay | Admitting: *Deleted

## 2019-01-25 NOTE — Patient Outreach (Addendum)
[place] [facility] Care Management  01/25/2019  Wayne Booth 08/04/1940 [id number]   No response from patient outreach attempts will proceed with case closure.     Objective:Per KPN (Knowledge Performance Now, point of care tool) and chart review,patient has had no recent hospitalizations or ED visits. Patient also has a history of chronic diastolic congestive heart failure, hypertension, asthmatic bronchitis, hypothyroidism, CAD, chronic PE, renal cell cancer with status post nephrectomy, prostate cancer, OSA with CPAP intolerance, Pituitary cyst,Atrial fibrillation, and pneumonia.     Assessment: Received [facility] MD referral on 01/11/2019. Referral source: Tammy Parrett Nurse Practitioner at pulmonologist office. Referral reason: patient assistance for Symbicort / albuterol inhalers, diagnosis COPD, pneumonia, asthmatic bronchitis. Screening follow up not completed due to unable to contact patient and will proceed with case closure.      Plan:Case closure due to unable to reach.  RNCM will send MD case closure letter to primary MD and referral source.       Lovella Hardie H. Annia Friendly, BSN, [place] Management [facility] Telephonic CM Phone: [phone] Fax: [phone]

## 2019-01-26 NOTE — Progress Notes (Signed)
Subjective: 78 year old male presents the office today for concerns of pain to both of his feet.  He says he has pain to his right big toe.  This amount of the last 6 months.  He does note some swelling mostly the right foot along the big toe area. No redness. No recent trauma.  He states that he gets spasms in his big toes as well.  He occasionally gets burning to his feet as well. Denies any systemic complaints such as fevers, chills, nausea, vomiting. No acute changes since last appointment, and no other complaints at this time.   Objective: AAO x3, NAD DP/PT pulses palpable bilaterally, CRT less than 3 seconds Sensation mildly decreased with SWMF.  Decreased range of motion of the first MPJs bilaterally there is localized edema to this area there is no erythema or warmth.  Unable to elicit any other areas of tenderness.  Mild swelling on the MPJs. No open lesions or pre-ulcerative lesions.  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  -X-rays were obtained reviewed.  Arthritic changes present the first MPJ.  No evidence of acute fracture. -We discussed shoe modifications and orthotics.  We will check insurance coverage for him but we discussed wearing stiffer soled shoe.  Use topical anti-inflammatory such as Voltaren as needed. -Conservative management discussed treatment options.  The burn is causing moderate discomfort.  Prescribed gabapentin.  Will start on the milligrams at home, titrate up. -Patient encouraged to call the office with any questions, concerns, change in symptoms.

## 2019-01-30 NOTE — Progress Notes (Signed)
Virtual Visit via Telephone Note  I connected with Wayne Booth on 01/30/19 at 10:00 AM EDT by telephone and verified that I am speaking with the correct person using two identifiers.  Location: Patient: Home Provider: Office   I discussed the limitations, risks, security and privacy concerns of performing an evaluation and management service by telephone and the availability of in person appointments. I also discussed with the patient that there may be a patient responsible charge related to this service. The patient expressed understanding and agreed to proceed.   History of Present Illness: 78 year old male, minimal former smoker. PMH significant for OSA, pleural effusion, asthmatic bronchitis, chronic resp failure. Patient of Dr. Elsworth Soho, last seen by pulmonary NP on 10/24/18.   Previous [facility] pulmonary encounter: 01/23/2019 Patient contacted today for televisit, reports increased shortness of breath x 1-2 weeks. Dyspnea is much worse on exertion. Associated chest tightness and occasional wheezing. He is not aware of his medications, states that his wife manages them and he has been taking all as prescribed. Reports that he has been using his Symbicort inhaler three times a day. Reports that his weight is baseline. Feels his legs look more swollen. Confirmed with his wife that he is taking diuretic. O2 92-95% 2L. Denies fever, cough or sick contact. BNP normal. Given prednisone taper. BNP was normal. No need to increase diuretic right now. Ddimer was slightly elevated but I have low suspicion for PE because he is already on a blood thinner. Continue steroid course. Please make sure he follow up with me in 1 week telelvisit. Covid negative in May.   01/31/2019 Patient contacted today for 1 week follow-up. He is feeling a little better but still has some shortness of breath with activity. Otherwise feels well. This has been going on for a long time. He is not as active as he normally would be in  the fall. Taking diuretic as prescribed. No weight gain, leg swelling is a little better. Denies cheest pain, cough or phlegm productive. No sick contact.   Observations/Objective:  - No shortness of breath, wheezing or cough  Assessment and Plan:  Moderate persistent asthma  - Increase Symbicort 160, take two puffs twice daily - Additional prednisone 20mg  x 5 days - Needs repeat PFTs with COVID testing  Follow Up Instructions:   - FU in 4 weeks   I discussed the assessment and treatment plan with the patient. The patient was provided an opportunity to ask questions and all were answered. The patient agreed with the plan and demonstrated an understanding of the instructions.   The patient was advised to call back or seek an in-person evaluation if the symptoms worsen or if the condition fails to improve as anticipated.  I provided 15 minutes of non-face-to-face time during this encounter.   Martyn Ehrich, NP

## 2019-01-31 ENCOUNTER — Telehealth: Payer: Self-pay | Admitting: Adult Health

## 2019-01-31 ENCOUNTER — Other Ambulatory Visit: Payer: Self-pay

## 2019-01-31 ENCOUNTER — Encounter: Payer: Self-pay | Admitting: Primary Care

## 2019-01-31 ENCOUNTER — Ambulatory Visit (INDEPENDENT_AMBULATORY_CARE_PROVIDER_SITE_OTHER): Payer: Medicare Other | Admitting: Primary Care

## 2019-01-31 DIAGNOSIS — J4540 Moderate persistent asthma, uncomplicated: Secondary | ICD-10-CM | POA: Diagnosis not present

## 2019-01-31 DIAGNOSIS — Z6841 Body Mass Index (BMI) 40.0 and over, adult: Secondary | ICD-10-CM | POA: Diagnosis not present

## 2019-01-31 MED ORDER — BUDESONIDE-FORMOTEROL FUMARATE 160-4.5 MCG/ACT IN AERO: 2.0000 | INHALATION_SPRAY | Freq: Two times a day (BID) | RESPIRATORY_TRACT | 0 refills | Status: DC

## 2019-01-31 MED ORDER — PREDNISONE 10 MG PO TABS: ORAL_TABLET | ORAL | 0 refills | Status: DC

## 2019-01-31 NOTE — Telephone Encounter (Signed)
[name], pts son came by to get Symbicort sample. Nothing further is needed.

## 2019-01-31 NOTE — Patient Instructions (Addendum)
Nutrition consult re: BMI 41  Repeat PFTs with COVID testing prior   Increase Symbicort 160- take 2 puffs twice daily   Addition prednisone for 5 days (20mg  x 5 days)  Follow up in 4 weeks

## 2019-01-31 NOTE — Telephone Encounter (Signed)
FYI Beth  

## 2019-01-31 NOTE — Addendum Note (Signed)
Addended by: Joella Prince on: 01/31/2019 10:30 AM   Modules accepted: Orders

## 2019-01-31 NOTE — Telephone Encounter (Signed)
Sending in 5 days

## 2019-01-31 NOTE — Addendum Note (Signed)
Addended by: Joella Prince on: 01/31/2019 10:46 AM   Modules accepted: Orders

## 2019-02-02 ENCOUNTER — Ambulatory Visit: Payer: Medicare Other | Admitting: Orthotics

## 2019-02-02 ENCOUNTER — Ambulatory Visit: Payer: Medicare Other | Admitting: Podiatry

## 2019-02-02 ENCOUNTER — Other Ambulatory Visit: Payer: Self-pay

## 2019-02-02 DIAGNOSIS — M779 Enthesopathy, unspecified: Secondary | ICD-10-CM

## 2019-02-02 DIAGNOSIS — M2021 Hallux rigidus, right foot: Secondary | ICD-10-CM | POA: Diagnosis not present

## 2019-02-02 DIAGNOSIS — G629 Polyneuropathy, unspecified: Secondary | ICD-10-CM | POA: Diagnosis not present

## 2019-02-02 DIAGNOSIS — G4733 Obstructive sleep apnea (adult) (pediatric): Secondary | ICD-10-CM | POA: Diagnosis not present

## 2019-02-02 DIAGNOSIS — M2022 Hallux rigidus, left foot: Secondary | ICD-10-CM

## 2019-02-02 NOTE — Progress Notes (Signed)
Patient decided not to get f/o out of financial considerations.

## 2019-02-13 NOTE — Progress Notes (Signed)
Subjective: 78 year old male presents the office today for follow-up evaluation of neuropathy as well as bilateral foot pain.  He states he is on gabapentin and he is doing well taking 1 tablet at nighttime.  Symptoms are much improved.  Still gets overall discomfort to his feet mostly to his big toe joint describes as cramping sensation. To consider orthotics. Denies any systemic complaints such as fevers, chills, nausea, vomiting. No acute changes since last appointment, and no other complaints at this time.   Objective: AAO x3, NAD DP/PT pulses palpable bilaterally, CRT less than 3 seconds Sensation decreased to Semmes-Weinstein monofilament which is unchanged. Significant reduction in first MPJ range of motion bilaterally.  There is no area pinpoint tenderness identified today.  Mild edema to the first MPJs.  No erythema or warmth. No open lesions or pre-ulcerative lesions.  No pain with calf compression, swelling, warmth, erythema  Assessment: Neuropathy, hallux rigidus  Plan: -All treatment options discussed with the patient including all alternatives, risks, complications.  -Continue current dose of gabapentin.  Discussed possibly increasing the dose if needed. -Discussed wearing a stiffer soled shoe.  Also discussed orthotics.  Will double check insurance coverage. -Patient encouraged to call the office with any questions, concerns, change in symptoms.   Trula Slade DPM

## 2019-02-20 NOTE — Progress Notes (Signed)
Physical was canceled same day

## 2019-02-20 NOTE — Patient Instructions (Signed)
Health Maintenance Due  Topic Date Due  . INFLUENZA VACCINE  01/28/2019    Depression screen [facility] 2/9 11/17/2018 07/20/2018 06/10/2018  Decreased Interest 0 0 1  Down, Depressed, Hopeless 0 0 0  PHQ - 2 Score 0 0 1  Altered sleeping 1 1 3   Tired, decreased energy 0 3 3  Change in appetite 0 0 1  Feeling bad or failure about yourself  0 0 0  Trouble concentrating 0 0 1  Moving slowly or fidgety/restless 0 1 0  Suicidal thoughts 0 0 0  PHQ-9 Score 1 5 9   Difficult doing work/chores Not difficult at all Not difficult at all Somewhat difficult  Some recent data might be hidden

## 2019-02-21 ENCOUNTER — Ambulatory Visit: Payer: Medicare Other | Admitting: Family Medicine

## 2019-02-26 ENCOUNTER — Other Ambulatory Visit: Payer: Self-pay | Admitting: Family Medicine

## 2019-03-02 NOTE — Addendum Note (Signed)
Addended by: Roney Jaffe on: 03/02/2019 09:53 AM   Modules accepted: Orders

## 2019-03-03 ENCOUNTER — Other Ambulatory Visit: Payer: Self-pay | Admitting: Family Medicine

## 2019-03-05 DIAGNOSIS — G4733 Obstructive sleep apnea (adult) (pediatric): Secondary | ICD-10-CM | POA: Diagnosis not present

## 2019-03-08 ENCOUNTER — Other Ambulatory Visit: Payer: Self-pay | Admitting: Family Medicine

## 2019-03-08 NOTE — Telephone Encounter (Signed)
Last OV 11/17/18 Last refill 11/14/18 #90/3 Next OV not scheduled  Forwarding to Dr. Yong Channel.

## 2019-03-14 ENCOUNTER — Telehealth: Payer: Self-pay | Admitting: Family Medicine

## 2019-03-14 ENCOUNTER — Ambulatory Visit: Payer: Medicare Other | Admitting: Adult Health

## 2019-03-14 NOTE — Telephone Encounter (Signed)
LVM for patient to call back and schedule with first available with Dr. Yong Channel then add to the wait list.

## 2019-03-14 NOTE — Telephone Encounter (Signed)
Pt cancelled f/u visit scheduled for 02/21/19. Please contact pt to schedule f/u visit with Dr. Yong Channel to discuss.

## 2019-03-14 NOTE — Telephone Encounter (Signed)
Please Advise.   Copied from [place] [id number]. Topic: Appointment Scheduling - Scheduling Inquiry for Clinic >> Mar 14, 2019 12:33 PM Erick Blinks wrote: Reason for CRM: Pt's wife called reporting that pt has build up of fluids again and needs to be seen as soon as possible by Dr. Yong Channel. Mrs. Debrosse states that they were instructed to call PCP whenever this problem resurfaced. Please advise. Best contact: [phone]

## 2019-03-15 ENCOUNTER — Encounter: Payer: Self-pay | Admitting: Adult Health

## 2019-03-15 ENCOUNTER — Other Ambulatory Visit: Payer: Self-pay

## 2019-03-15 ENCOUNTER — Ambulatory Visit (INDEPENDENT_AMBULATORY_CARE_PROVIDER_SITE_OTHER): Payer: Medicare Other

## 2019-03-15 ENCOUNTER — Ambulatory Visit: Payer: Medicare Other | Admitting: Adult Health

## 2019-03-15 VITALS — BP 128/76 | HR 86 | Temp 97.8°F | Ht 71.0 in | Wt 292.8 lb

## 2019-03-15 DIAGNOSIS — I509 Heart failure, unspecified: Secondary | ICD-10-CM | POA: Diagnosis not present

## 2019-03-15 DIAGNOSIS — N183 Chronic kidney disease, stage 3 unspecified: Secondary | ICD-10-CM

## 2019-03-15 DIAGNOSIS — R0602 Shortness of breath: Secondary | ICD-10-CM | POA: Diagnosis not present

## 2019-03-15 DIAGNOSIS — I5032 Chronic diastolic (congestive) heart failure: Secondary | ICD-10-CM | POA: Diagnosis not present

## 2019-03-15 DIAGNOSIS — I2782 Chronic pulmonary embolism: Secondary | ICD-10-CM

## 2019-03-15 DIAGNOSIS — G4733 Obstructive sleep apnea (adult) (pediatric): Secondary | ICD-10-CM

## 2019-03-15 DIAGNOSIS — J9611 Chronic respiratory failure with hypoxia: Secondary | ICD-10-CM | POA: Diagnosis not present

## 2019-03-15 LAB — CBC WITH DIFFERENTIAL/PLATELET
Basophils Absolute: 0.1 10*3/uL (ref 0.0–0.1)
Basophils Relative: 0.9 % (ref 0.0–3.0)
Eosinophils Absolute: 0.8 10*3/uL — ABNORMAL HIGH (ref 0.0–0.7)
Eosinophils Relative: 7.2 % — ABNORMAL HIGH (ref 0.0–5.0)
HCT: 51.8 % (ref 39.0–52.0)
Hemoglobin: 17.3 g/dL — ABNORMAL HIGH (ref 13.0–17.0)
Lymphocytes Relative: 13.8 % (ref 12.0–46.0)
Lymphs Abs: 1.5 10*3/uL (ref 0.7–4.0)
MCHC: 33.3 g/dL (ref 30.0–36.0)
MCV: 93.9 fl (ref 78.0–100.0)
Monocytes Absolute: 1.2 10*3/uL — ABNORMAL HIGH (ref 0.1–1.0)
Monocytes Relative: 10.9 % (ref 3.0–12.0)
Neutro Abs: 7.1 10*3/uL (ref 1.4–7.7)
Neutrophils Relative %: 67.2 % (ref 43.0–77.0)
Platelets: 340 10*3/uL (ref 150.0–400.0)
RBC: 5.52 Mil/uL (ref 4.22–5.81)
RDW: 14.4 % (ref 11.5–15.5)
WBC: 10.6 10*3/uL — ABNORMAL HIGH (ref 4.0–10.5)

## 2019-03-15 LAB — BASIC METABOLIC PANEL
BUN: 15 mg/dL (ref 6–23)
CO2: 30 mEq/L (ref 19–32)
Calcium: 9.9 mg/dL (ref 8.4–10.5)
Chloride: 96 mEq/L (ref 96–112)
Creatinine, Ser: 1.23 mg/dL (ref 0.40–1.50)
GFR: 57.1 mL/min — ABNORMAL LOW (ref >60.00)
Glucose, Bld: 93 mg/dL (ref 70–99)
Potassium: 4.1 mEq/L (ref 3.5–5.1)
Sodium: 135 mEq/L (ref 135–145)

## 2019-03-15 LAB — BRAIN NATRIURETIC PEPTIDE: Pro B Natriuretic peptide (BNP): 31 pg/mL (ref 0.0–100.0)

## 2019-03-15 NOTE — Assessment & Plan Note (Signed)
Progressive dyspnea with exertion suspect is multifactorial with decompensated diastolic heart failure, multiple comorbidities including untreated sleep apnea. Chest x-ray and labs are pending today.  Patient has ongoing shortness of breath.  Will refer to cardiology has he has underlying diastolic heart failure and A. fib.  It is been hard to control his lower extremity edema despite daily Lasix.  Have to use caution as he has 1 kidney.  And chronic kidney disease  Plan  Patient Instructions  Continue on Oxygen 2l/m with activity and At bedtime  . Take extra Lasix 40mg  for 4 days . Continue after this Lasix 40mg  Twice daily   Continue on Symbicort 2 puffs Twice daily  , rinse after use.  Low salt diet . Legs elevated .  Labs and Chest xray today .  Work on weight loss .  Weigh daily , keep log .  Advance activity as tolerated  Refer to Cardiology .  Restart CPAP At bedtime  .  Try to wear all night .  Follow up with Dr. Elsworth Soho  Or Parrett NP in 1 -2 week and As needed   Please contact office for sooner follow up if symptoms do not improve or worsen or seek emergency care

## 2019-03-15 NOTE — Assessment & Plan Note (Signed)
Check be met today

## 2019-03-15 NOTE — Assessment & Plan Note (Signed)
Decompensated D CHF - extra lasix for 4 days  Check bmet /bnp  Refer to cardiology  Low salt diet  Wt loss  Check cxr   Plan  Patient Instructions  Continue on Oxygen 2l/m with activity and At bedtime  . Take extra Lasix 40mg  for 4 days . Continue after this Lasix 40mg  Twice daily   Continue on Symbicort 2 puffs Twice daily  , rinse after use.  Low salt diet . Legs elevated .  Labs and Chest xray today .  Work on weight loss .  Weigh daily , keep log .  Advance activity as tolerated  Refer to Cardiology .  Restart CPAP At bedtime  .  Try to wear all night .  Follow up with Dr. Elsworth Soho  Or Parrett NP in 1 -2 week and As needed   Please contact office for sooner follow up if symptoms do not improve or worsen or seek emergency care

## 2019-03-15 NOTE — Progress Notes (Signed)
'@Patient'  ID: [name], male    DOB: 1940-12-12, 78 y.o.   MRN: [id number]  Chief Complaint  Patient presents with  . Acute Visit    dyspnea     Referring provider: Marin Olp, MD  HPI:  78 year old male former smoker (very minimal )  followed for recurrent pneumonia and asthmatic bronchitis Patient has severe obstructive sleep apnea with CPAP intolerance and declines further evaluation. PE April 2018 on anticoagulation  Medical history significant for previous pituitary tumor removal,A. Fib,renal cell carcinoma status post nephrectomy, Diastolic CHF   TEST/EVENTS :  CT chest June 08, 2017 showed a small left lower lobe area of consolidation.  PFT 07/2017 moderate restriction, DLCO 67%  CT chest October 2019 small to moderate bilateral pleural effusions consolidations within the left greater than right lower lobe and lingula. Hazy density in the left greater than right lower lobe.  2D echo February 04, 2018 showed EF 50 to 55%  12/2018 HRCT Chest -patchy parenchymal groundglass with interstitial coarsening bilaterally similar to 2019 from a multi lobar pneumonia felt to be a component of postinflammatory changes not consistent with UIP.   03/15/2019 Acute OV : Dyspnea  Patient presents for an acute office visit.  Patient has multiple comorbidities as above with history of recurrent pneumonia complicated by asthmatic bronchitis severe untreated sleep apnea due to CPAP intolerance, diastolic heart failure.  He has previous kidney cancer status post nephrectomy.  Patient complains over the last 2 weeks he has had increased shortness of breath with activity and increased lower extremity swelling.  Patient has chronic dyspnea which he says has been progressively getting worse.  He is on Lasix 80 mg twice daily.  He is accompanied by his wife who says that he does not adhere to a low-salt diet and is been eating a lot of food lately.  Previous pulmonary function  testing in 2019 showed moderate restriction with a diffusing defect (DLCO 67%).  High-resolution CT chest was done in July this year that showed interstitial coarsening similar to 2019 felt to be postinflammatory changes not consistent with UIP.  2D echo in 2019 showed a preserved EF.  Patient is on Eliquis.  He denies any hemoptysis chest pain.  He does sleep in a recliner.  Patient has repeat PFTs coming up.  He has been referred to the weight loss clinic but has not had an appointment as of yet. He does have a dry cough.  No discolored mucus or fever.  He denies chest pain.   Allergies  Allergen Reactions  . Ambien [Zolpidem] Anxiety    Anxiety and agitation when tried as sleeper in hospital aug 2019    Immunization History  Administered Date(s) Administered  . Influenza Split 04/01/2011, 03/29/2012  . Influenza Whole 04/12/2007, 03/18/2009, 03/04/2010  . Influenza, High Dose Seasonal PF 04/03/2014, 04/04/2015, 04/24/2016, 04/12/2017, 04/26/2018  . Influenza,inj,Quad PF,6+ Mos 03/22/2013  . Influenza-Unspecified 05/29/2017  . Pneumococcal Conjugate-13 11/12/2014  . Pneumococcal Polysaccharide-23 06/09/2011  . Pneumococcal-Unspecified 06/30/2015  . Td 06/29/1994  . Tdap 06/09/2011  . Zoster 09/22/2007    Past Medical History:  Diagnosis Date  . Allergy    States his nose runs when he is around grease.   Marland Kitchen Anxiety   . Arthritis   . Atrial fibrillation ([place])   . Cancer ([place])   . Coronary artery disease   . History of total knee replacement   . Hypertension   . Hypothyroidism   . 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)   . Kidney stone   . OSA (obstructive sleep apnea)    pt does not wear cpap at night  . Pituitary cyst ([place])   . Pneumonia    hx of  . Prostate cancer ([place])     Tobacco History: Social History   Tobacco Use  Smoking Status Former Smoker  . Years: 4.00  . Types: Cigars  Smokeless Tobacco Never Used  Tobacco Comment    smoked cigars 1-2x per year; QUIT 2 YEARS AGO   2017   Counseling given: Not Answered Comment: smoked cigars 1-2x per year; QUIT 2 YEARS AGO   2017   Outpatient Medications Prior to Visit  Medication Sig Dispense Refill  . albuterol (VENTOLIN HFA) 108 (90 Base) MCG/ACT inhaler Inhale 2 puffs into the lungs every 6 (six) hours as needed for wheezing or shortness of breath. 6.7 g 2  . apixaban (ELIQUIS) 5 MG TABS tablet Take 1 tablet (5 mg total) by mouth 2 (two) times daily. 60 tablet 11  . budesonide-formoterol (SYMBICORT) 80-4.5 MCG/ACT inhaler Inhale 2 puffs into the lungs 2 (two) times a day. 1 Inhaler 12  . budesonide-formoterol (SYMBICORT) 80-4.5 MCG/ACT inhaler Inhale 2 puffs into the lungs 2 (two) times daily. 2 Inhaler 0  . busPIRone (BUSPAR) 5 MG tablet TAKE ONE TABLET BY MOUTH TWICE A DAY AS NEEDED ANXIETY 30 tablet 4  . colchicine 0.6 MG tablet Take 1 tablet (0.6 mg total) by mouth 2 (two) times daily. 60 tablet 0  . cyclobenzaprine (FLEXERIL) 10 MG tablet TAKE ONE TABLET BY MOUTH THREE TIMES A DAY AS NEEDED FOR MUSCLE SPASMS 30 tablet 0  . escitalopram (LEXAPRO) 20 MG tablet TAKE ONE TABLET BY MOUTH DAILY 90 tablet 0  . furosemide (LASIX) 40 MG tablet Take 1-2 tablets (40-80 mg total) by mouth 2 (two) times daily. 120 tablet 5  . gabapentin (NEURONTIN) 100 MG capsule Take 1 capsule (100 mg total) by mouth at bedtime. 90 capsule 0  . KLOR-CON M20 20 MEQ tablet TAKE ONE TABLET BY MOUTH TWICE A DAY 120 tablet 0  . levothyroxine (SYNTHROID) 150 MCG tablet TAKE ONE TABLET BY MOUTH DAILY 90 tablet 0  . montelukast (SINGULAIR) 10 MG tablet Take 1 tablet (10 mg total) by mouth daily. 30 tablet 11  . Multiple Vitamins-Minerals (MULTIVITAMINS THER. W/MINERALS) TABS Take 1 tablet by mouth daily.    . nortriptyline (PAMELOR) 75 MG capsule TAKE ONE CAPSULE BY MOUTH EVERY AT BEDTIME 90 capsule 0  . predniSONE (DELTASONE) 10 MG tablet Take 2 tabs x 5 days 10 tablet 0  . propranolol (INDERAL) 10  MG tablet TAKE ONE TABLET BY MOUTH TWICE A DAY 120 tablet 0  . Spacer/Aero-Holding Chambers (AEROCHAMBER MV) inhaler Use as instructed 1 each 0  . traMADol (ULTRAM) 50 MG tablet TAKE ONE TABLET BY MOUTH EVERY 6 HOURS AS NEEDED FOR MODERATE PAIN 30 tablet 1  . traZODone (DESYREL) 50 MG tablet TAKE ONE-HALF TO 1 TABLET BY MOUTH AT BEDTIME AS NEEDED FOR SLEEP 90 tablet 3  . budesonide-formoterol (SYMBICORT) 160-4.5 MCG/ACT inhaler Inhale 2 puffs into the lungs 2 (two) times daily for 1 day. 1 Inhaler 0   No facility-administered medications prior to visit.      Review of Systems:   Constitutional:   No  weight loss, night sweats,  Fevers, chills,  +fatigue, or  lassitude.  HEENT:   No headaches,  Difficulty swallowing,  Tooth/dental problems, or  Sore throat,  No sneezing, itching, ear ache, nasal congestion, post nasal drip,   CV:  No chest pain,  Orthopnea, PND, ++swelling in lower extremities, anasarca, dizziness, palpitations, syncope.   GI  No heartburn, indigestion, abdominal pain, nausea, vomiting, diarrhea, change in bowel habits, loss of appetite, bloody stools.   Resp:    No chest wall deformity  Skin: no rash or lesions.  GU: no dysuria, change in color of urine, no urgency or frequency.  No flank pain, no hematuria   MS:  No joint pain or swelling.  No decreased range of motion.  No back pain.    Physical Exam  BP 128/76 (BP Location: Left Arm, Cuff Size: Normal)   Pulse 86   Temp 97.8 F (36.6 C) (Temporal)   Ht '5\' 11"'  (1.803 m)   Wt 292 lb 12.8 oz (132.8 kg)   SpO2 94%   BMI 40.84 kg/m   GEN: A/Ox3; pleasant , NAD, morbidly obese   HEENT:  [place]/AT, , NOSE-clear, THROAT-clear, no lesions, no postnasal drip or exudate noted.  Class III MP airway  NECK:  Supple w/ fair ROM; no JVD; normal carotid impulses w/o bruits; no thyromegaly or nodules palpated; no lymphadenopathy.    RESP  Clear  P & A; w/o, wheezes/ rales/ or rhonchi. no accessory muscle  use, no dullness to percussion  CARD:  RRR, no m/r/g, 2+ peripheral edema, pulses intact, no cyanosis or clubbing.  GI:   Soft & nt; nml bowel sounds; no organomegaly or masses detected.   Musco: Warm bil, no deformities or joint swelling noted.   Neuro: alert, no focal deficits noted.    Skin: Warm, no lesions or rashes    Lab Results:  CBC    Component Value Date/Time   WBC 8.8 01/24/2019 1024   RBC 5.34 01/24/2019 1024   HGB 16.7 01/24/2019 1024   HGB 16.0 01/21/2018 0000   HCT 49.8 01/24/2019 1024   HCT 46.7 01/21/2018 0000   PLT 340.0 01/24/2019 1024   PLT 341 01/21/2018 0000   MCV 93.3 01/24/2019 1024   MCV 91 01/21/2018 0000   MCH 28.8 04/20/2018 0247   MCHC 33.6 01/24/2019 1024   RDW 15.0 01/24/2019 1024   RDW 14.4 01/21/2018 0000   LYMPHSABS 1.7 01/24/2019 1024   MONOABS 0.9 01/24/2019 1024   EOSABS 0.5 01/24/2019 1024   BASOSABS 0.1 01/24/2019 1024    BMET    Component Value Date/Time   NA 135 11/23/2018 1103   NA 136 01/21/2018 0000   K 4.4 11/23/2018 1103   CL 97 11/23/2018 1103   CO2 29 11/23/2018 1103   GLUCOSE 95 11/23/2018 1103   BUN 16 11/23/2018 1103   BUN 15 01/21/2018 0000   CREATININE 1.26 11/23/2018 1103   CREATININE 1.16 11/27/2016 1555   CALCIUM 9.1 11/23/2018 1103   GFRNONAA 60 (L) 04/20/2018 0247   GFRAA >60 04/20/2018 0247    BNP    Component Value Date/Time   BNP 178.0 (H) 04/14/2018 1808    ProBNP    Component Value Date/Time   PROBNP 27.0 01/24/2019 1024    Imaging: Dg Chest 2 View  Result Date: 03/15/2019 CLINICAL DATA:  Chronic heart failure. Dyspnea. EXAM: CHEST - 2 VIEW COMPARISON:  Chest x-ray dated 10/24/2018 and 07/20/2018 and CT scan of the chest dated 01/04/2019 FINDINGS: The heart size and pulmonary vascularity are normal. Slight prominence of the mediastinum in the region of the azygos vein is chronic and represents a combination of the  azygos vein, small lymph nodes, and mediastinal fat as demonstrated  on the prior CT scan. Aortic atherosclerosis. There is chronic accentuation of the interstitial markings at both bases with slight chronic lateral pleural thickening on the left and chronic hazy density in the right midzone, unchanged since January 2020. No acute infiltrates or effusions. No acute bone abnormality. IMPRESSION: 1. No change in the appearance of the chest since January 2020. Chronic interstitial lung disease. 2. Aortic atherosclerosis. Electronically Signed   By: Lorriane Shire M.D.   On: 03/15/2019 12:21      PFT Results Latest Ref Rng & Units 08/11/2017  FVC-Pre L 2.20  FVC-Predicted Pre % 54  FVC-Post L 2.16  FVC-Predicted Post % 53  Pre FEV1/FVC % % 79  Post FEV1/FCV % % 84  FEV1-Pre L 1.74  FEV1-Predicted Pre % 59  FEV1-Post L 1.81  DLCO UNC% % 67  DLCO COR %Predicted % 132  TLC L 4.77  TLC % Predicted % 69  RV % Predicted % 96    No results found for: NITRICOXIDE      Assessment & Plan:   Dyspnea Progressive dyspnea with exertion suspect is multifactorial with decompensated diastolic heart failure, multiple comorbidities including untreated sleep apnea. Chest x-ray and labs are pending today.  Patient has ongoing shortness of breath.  Will refer to cardiology has he has underlying diastolic heart failure and A. fib.  It is been hard to control his lower extremity edema despite daily Lasix.  Have to use caution as he has 1 kidney.  And chronic kidney disease  Plan  Patient Instructions  Continue on Oxygen 2l/m with activity and At bedtime  . Take extra Lasix 41m for 4 days . Continue after this Lasix 42mTwice daily   Continue on Symbicort 2 puffs Twice daily  , rinse after use.  Low salt diet . Legs elevated .  Labs and Chest xray today .  Work on weight loss .  Weigh daily , keep log .  Advance activity as tolerated  Refer to Cardiology .  Restart CPAP At bedtime  .  Try to wear all night .  Follow up with Dr. AlElsworth SohoOr  NP in 1 -2 week and  As needed   Please contact office for sooner follow up if symptoms do not improve or worsen or seek emergency care          [place]  Weight loss clinic referral is pending  CKD (chronic kidney disease) stage 3, GFR 30-59 ml/min (HCC) Check be met today  Chronic respiratory failure with hypoxia (HCC) Continue on oxygen with activity at bedtime.  No increased oxygen demands.  (HFpEF) heart failure with preserved ejection fraction (HCC) Decompensated D CHF - extra lasix for 4 days  Check bmet /bnp  Refer to cardiology  Low salt diet  Wt loss  Check cxr   Plan  Patient Instructions  Continue on Oxygen 2l/m with activity and At bedtime  . Take extra Lasix 4038mor 4 days . Continue after this Lasix 64m91mice daily   Continue on Symbicort 2 puffs Twice daily  , rinse after use.  Low salt diet . Legs elevated .  Labs and Chest xray today .  Work on weight loss .  Weigh daily , keep log .  Advance activity as tolerated  Refer to Cardiology .  Restart CPAP At bedtime  .  Try to wear all night .  Follow up with Dr. Elsworth Soho  Or  NP in 1 -2 week and As needed   Please contact office for sooner follow up if symptoms do not improve or worsen or seek emergency care          Chronic pulmonary embolism (HCC) Previous PE and A Fib  Cont on Eliquis    OSA (obstructive sleep apnea) Needs to restart CPAP At bedtime    Plan  Patient Instructions  Continue on Oxygen 2l/m with activity and At bedtime  . Take extra Lasix 101m for 4 days . Continue after this Lasix 477mTwice daily   Continue on Symbicort 2 puffs Twice daily  , rinse after use.  Low salt diet . Legs elevated .  Labs and Chest xray today .  Work on weight loss .  Weigh daily , keep log .  Advance activity as tolerated  Refer to Cardiology .  Restart CPAP At bedtime  .  Try to wear all night .  Follow up with Dr. AlElsworth SohoOr  NP in 1 -2 week and As needed   Please  contact office for sooner follow up if symptoms do not improve or worsen or seek emergency care             TaRexene EdisonNP 03/15/2019

## 2019-03-15 NOTE — Assessment & Plan Note (Signed)
Previous PE and A Fib  Cont on Eliquis

## 2019-03-15 NOTE — Assessment & Plan Note (Signed)
Needs to restart CPAP At bedtime    Plan  Patient Instructions  Continue on Oxygen 2l/m with activity and At bedtime  . Take extra Lasix 40mg  for 4 days . Continue after this Lasix 40mg  Twice daily   Continue on Symbicort 2 puffs Twice daily  , rinse after use.  Low salt diet . Legs elevated .  Labs and Chest xray today .  Work on weight loss .  Weigh daily , keep log .  Advance activity as tolerated  Refer to Cardiology .  Restart CPAP At bedtime  .  Try to wear all night .  Follow up with Dr. Elsworth Soho  Or Parrett NP in 1 -2 week and As needed   Please contact office for sooner follow up if symptoms do not improve or worsen or seek emergency care

## 2019-03-15 NOTE — Assessment & Plan Note (Signed)
Weight loss is key.  Weight loss clinic referral is pending

## 2019-03-15 NOTE — Patient Instructions (Addendum)
Continue on Oxygen 2l/m with activity and At bedtime  . Take extra Lasix 40mg  for 4 days . Continue after this Lasix 40mg  Twice daily   Continue on Symbicort 2 puffs Twice daily  , rinse after use.  Low salt diet . Legs elevated .  Labs and Chest xray today .  Work on weight loss .  Weigh daily , keep log .  Advance activity as tolerated  Refer to Cardiology .  Restart CPAP At bedtime  .  Try to wear all night .  Follow up with Dr. Elsworth Soho  Or Parrett NP in 1 -2 week and As needed   Please contact office for sooner follow up if symptoms do not improve or worsen or seek emergency care

## 2019-03-15 NOTE — Assessment & Plan Note (Signed)
Continue on oxygen with activity at bedtime.  No increased oxygen demands.

## 2019-03-16 ENCOUNTER — Encounter: Payer: Self-pay | Admitting: Family Medicine

## 2019-03-16 ENCOUNTER — Ambulatory Visit (INDEPENDENT_AMBULATORY_CARE_PROVIDER_SITE_OTHER): Payer: Medicare Other

## 2019-03-16 DIAGNOSIS — Z23 Encounter for immunization: Secondary | ICD-10-CM | POA: Diagnosis not present

## 2019-03-24 ENCOUNTER — Ambulatory Visit: Payer: Medicare Other | Admitting: Adult Health

## 2019-03-27 ENCOUNTER — Ambulatory Visit (INDEPENDENT_AMBULATORY_CARE_PROVIDER_SITE_OTHER): Payer: Medicare Other | Admitting: Bariatrics

## 2019-03-28 ENCOUNTER — Other Ambulatory Visit: Payer: Self-pay | Admitting: Family Medicine

## 2019-03-28 ENCOUNTER — Ambulatory Visit: Payer: Medicare Other | Admitting: Physician Assistant

## 2019-03-31 ENCOUNTER — Telehealth: Payer: Self-pay | Admitting: Family Medicine

## 2019-03-31 NOTE — Telephone Encounter (Signed)
Only option would be transitioning to Coumadin/warfarin which would require regular office visits to have this checked.  If he would like to make this transition would suggest office follow-up to discuss

## 2019-03-31 NOTE — Telephone Encounter (Signed)
[name] calling to inquire if Dr. Yong Channel would be willing to offer suggestions regarding patient's eliquis. She states that patient is in donut hole and will not be able to afford this medication for the months of October, November or December, as this medication is $150+.

## 2019-03-31 NOTE — Telephone Encounter (Signed)
See below

## 2019-04-03 NOTE — Telephone Encounter (Signed)
Spouse returning call  °

## 2019-04-03 NOTE — Telephone Encounter (Signed)
Returned call and left detailed message on pt wife vm.

## 2019-04-03 NOTE — Telephone Encounter (Signed)
Called and lm on pt vm regarding Dr. Yong Channel note below.

## 2019-04-03 NOTE — Telephone Encounter (Signed)
[name] is returning a call to [place].  Please call wife back to discuss medication.  CB# [phone]

## 2019-04-03 NOTE — Telephone Encounter (Signed)
Called pt wife again and left another vm with the details below.

## 2019-04-03 NOTE — Telephone Encounter (Signed)
See note

## 2019-04-04 DIAGNOSIS — G4733 Obstructive sleep apnea (adult) (pediatric): Secondary | ICD-10-CM | POA: Diagnosis not present

## 2019-04-06 ENCOUNTER — Other Ambulatory Visit: Payer: Self-pay | Admitting: Pulmonary Disease

## 2019-04-06 ENCOUNTER — Other Ambulatory Visit: Payer: Self-pay

## 2019-04-06 DIAGNOSIS — Z20822 Contact with and (suspected) exposure to covid-19: Secondary | ICD-10-CM

## 2019-04-07 LAB — NOVEL CORONAVIRUS, NAA: SARS-CoV-2, NAA: NOT DETECTED

## 2019-04-09 ENCOUNTER — Other Ambulatory Visit: Payer: Self-pay | Admitting: Family Medicine

## 2019-04-10 ENCOUNTER — Telehealth: Payer: Self-pay

## 2019-04-10 ENCOUNTER — Ambulatory Visit (INDEPENDENT_AMBULATORY_CARE_PROVIDER_SITE_OTHER): Payer: Medicare Other | Admitting: Bariatrics

## 2019-04-10 ENCOUNTER — Other Ambulatory Visit (HOSPITAL_COMMUNITY)
Admission: RE | Admit: 2019-04-10 | Discharge: 2019-04-10 | Disposition: A | Discharge status: Home / Self Care | Payer: Medicare Other | Source: Ambulatory Visit | Attending: Pulmonary Disease | Admitting: Pulmonary Disease

## 2019-04-10 ENCOUNTER — Telehealth: Payer: Self-pay | Admitting: Adult Health

## 2019-04-10 DIAGNOSIS — Z20828 Contact with and (suspected) exposure to other viral communicable diseases: Secondary | ICD-10-CM | POA: Diagnosis not present

## 2019-04-10 DIAGNOSIS — Z01812 Encounter for preprocedural laboratory examination: Secondary | ICD-10-CM | POA: Diagnosis not present

## 2019-04-10 LAB — SARS CORONAVIRUS 2 (TAT 6-24 HRS): SARS Coronavirus 2: NEGATIVE

## 2019-04-10 NOTE — Telephone Encounter (Signed)
Patient and his wife called for COVID-19 results. Mr Wayne Booth was informed that his COVID-19 test result 04/06/2019 was negative. He was not infected with the novel coronavirus. Mr Wayne Booth wife was positive she he was informed to monitor for symptoms for 14 post exposure. Good preventative practices were reviewed with him.  He verbalized understanding of all information.

## 2019-04-13 ENCOUNTER — Ambulatory Visit: Payer: Medicare Other | Admitting: Adult Health

## 2019-04-14 ENCOUNTER — Telehealth: Payer: Self-pay

## 2019-04-14 ENCOUNTER — Ambulatory Visit (INDEPENDENT_AMBULATORY_CARE_PROVIDER_SITE_OTHER): Payer: Medicare Other

## 2019-04-14 ENCOUNTER — Encounter: Payer: Self-pay | Admitting: Family Medicine

## 2019-04-14 ENCOUNTER — Ambulatory Visit (INDEPENDENT_AMBULATORY_CARE_PROVIDER_SITE_OTHER): Payer: Medicare Other | Admitting: Family Medicine

## 2019-04-14 ENCOUNTER — Other Ambulatory Visit: Payer: Self-pay

## 2019-04-14 DIAGNOSIS — M79671 Pain in right foot: Secondary | ICD-10-CM

## 2019-04-14 DIAGNOSIS — Z0000 Encounter for general adult medical examination without abnormal findings: Secondary | ICD-10-CM

## 2019-04-14 DIAGNOSIS — R05 Cough: Secondary | ICD-10-CM | POA: Diagnosis not present

## 2019-04-14 DIAGNOSIS — M79672 Pain in left foot: Secondary | ICD-10-CM

## 2019-04-14 DIAGNOSIS — R059 Cough, unspecified: Secondary | ICD-10-CM

## 2019-04-14 MED ORDER — GABAPENTIN 100 MG PO CAPS: 300.0000 mg | ORAL_CAPSULE | Freq: Every day | ORAL | 0 refills | Status: DC

## 2019-04-14 NOTE — Progress Notes (Signed)
This visit is being conducted via phone call due to the COVID-19 pandemic. This patient has given me verbal consent via phone to conduct this visit, patient states they are participating from their home address. Some vital signs may be absent or patient reported.   Patient identification: identified by name, DOB, and current address.   Subjective:   MATTY Booth is a 78 y.o. male who presents for Medicare Annual/Subsequent preventive examination.  Review of Systems:   Cardiac Risk Factors include: advanced age (>31men, >51 women);obesity (BMI >30kg/m2);male gender;sedentary lifestyle     Objective:    Vitals: There were no vitals taken for this visit.  There is no height or weight on file to calculate BMI.  Advanced Directives 04/14/2019 04/14/2018 04/14/2018 04/09/2018 04/08/2018 02/26/2018 02/03/2018  Does Patient Have a Medical Advance Directive? Yes No No - No Yes No;Yes  Type of Advance Directive Living will;Healthcare Power of Attorney - - - - Living will Living will  Does patient want to make changes to medical advance directive? No - Patient declined - - - - No - Patient declined No - Patient declined  Copy of [place] in Chart? No - copy requested - - - - - -  Would patient like information on creating a medical advance directive? - No - Patient declined No - Patient declined No - Patient declined - - -  Pre-existing out of facility DNR order (yellow form or pink MOST form) - - - - - - -    Tobacco Social History   Tobacco Use  Smoking Status Former Smoker  . Years: 4.00  . Types: Cigars  Smokeless Tobacco Never Used  Tobacco Comment   smoked cigars 1-2x per year; QUIT 2 YEARS AGO   2017     Counseling given: Not Answered Comment: smoked cigars 1-2x per year; QUIT 2 YEARS AGO   2017   Clinical Intake:  Pre-visit preparation completed: Yes  Pain : 0-10 Pain Type: Acute pain Pain Location: Leg Pain Orientation: Left, Right Pain Radiating  Towards: up towards knee Pain Descriptors / Indicators: Burning, Constant Pain Onset: In the past 7 days Pain Frequency: Constant  Diabetes: No  How often do you need to have someone help you when you read instructions, pamphlets, or other written materials from your doctor or pharmacy?: 2 - Rarely  Interpreter Needed?: No  Information entered by :: Wayne George LPN  Past Medical History:  Diagnosis Date  . Allergy    States his nose runs when he is around grease.   Marland Kitchen Anxiety   . Arthritis   . Atrial fibrillation ([place])   . Cancer ([place])   . Coronary artery disease   . History of total knee replacement   . Hypertension   . Hypothyroidism   . Kidney cancer, primary, with metastasis from kidney to other site [facility])   . Kidney stone   . OSA (obstructive sleep apnea)    pt does not wear cpap at night  . Pituitary cyst ([place])   . Pneumonia    hx of  . Prostate cancer [facility])    Past Surgical History:  Procedure Laterality Date  . ABLATION OF DYSRHYTHMIC FOCUS  01/28/2018  . ANTERIOR CERVICAL DECOMP/DISCECTOMY FUSION N/A 08/29/2012   Procedure: ANTERIOR CERVICAL DECOMPRESSION/DISCECTOMY FUSION 1 LEVEL;  Surgeon: Wayne Moore, MD;  Location: [place] [facility]URO ORS;  Service: Neurosurgery;  Laterality: N/A;  . APPENDECTOMY    . ATRIAL FIBRILLATION ABLATION N/A 01/28/2018  Procedure: ATRIAL FIBRILLATION ABLATION;  Surgeon: Wayne Haw, MD;  Location: [place] CV LAB;  Service: Cardiovascular;  Laterality: N/A;  . CARDIAC CATHETERIZATION  2008   clean  . CHOLECYSTECTOMY    . COLONOSCOPY W/ POLYPECTOMY    . CRANIOTOMY N/A 11/08/2012   Procedure: CRANIOTOMY HYPOPHYSECTOMY TRANSNASAL APPROACH;  Surgeon: Wayne Ghee, MD;  Location: [place] [facility];  Service: Neurosurgery;  Laterality: N/A;  Transphenoidal Resection of Pituitary Tumor  . FINGER SURGERY Left 2017   Dr Wayne Booth  . INSERTION PROSTATE RADIATION SEED    . IR THORACENTESIS ASP PLEURAL SPACE W/IMG GUIDE  04/15/2018  . KIDNEY  STONE SURGERY    . KNEE ARTHROSCOPY  2007   left  . NEPHRECTOMY Left   . POSTERIOR CERVICAL LAMINECTOMY Left 04/24/2015   Procedure: Foraminotomy cervical five - cervical six cervical six - cervical seven left;  Surgeon: Wayne Moore, MD;  Location: [facility];  Service: Neurosurgery;  Laterality: Left;  . REPLACEMENT TOTAL KNEE BILATERAL     Family History  Problem Relation Age of Onset  . Cancer Father        [place], died before patient born  . Cancer Mother        stomach, died when he was young  . Kidney cancer Sister   . Colon cancer Brother 31  . Esophageal cancer Neg Hx   . Rectal cancer Neg Hx   . Stomach cancer Neg Hx    Social History   Socioeconomic History  . Marital status: Married    Spouse name: Wayne Booth  . Number of children: Y  . Years of education: Not on file  . Highest education level: Not on file  Occupational History  . Occupation: retired Armed forces operational officer: RETIRED  Social Needs  . Financial resource strain: Not on file  . Food insecurity    Worry: Not on file    Inability: Not on file  . Transportation needs    Medical: Not on file    Non-medical: Not on file  Tobacco Use  . Smoking status: Former Smoker    Years: 4.00    Types: Cigars  . Smokeless tobacco: Never Used  . Tobacco comment: smoked cigars 1-2x per year; QUIT 2 YEARS AGO   2017  Substance and Sexual Activity  . Alcohol use: Yes    Alcohol/week: 2.0 standard drinks    Types: 2 Shots of liquor per week    Comment: socially  . Drug use: No  . Sexual activity: Not on file  Lifestyle  . Physical activity    Days per week: Not on file    Minutes per session: Not on file  . Stress: Not on file  Relationships  . Social Herbalist on phone: Not on file    Gets together: Not on file    Attends religious service: Not on file    Active member of club or organization: Not on file    Attends meetings of clubs or organizations: Not on file     Relationship status: Not on file  Other Topics Concern  . Not on file  Social History Narrative   Married 1965 (wife patient of Dr. Megan Booth). 2 sons. 4 granddaughters.       Retired Chemical engineer.       Hobbies: golf, fishing, formerly hunting.     Outpatient Encounter Medications as of 04/14/2019  Medication Sig  . albuterol (VENTOLIN HFA) 108 (  90 Base) MCG/ACT inhaler Inhale 2 puffs into the lungs every 6 (six) hours as needed for wheezing or shortness of breath.  . budesonide-formoterol (SYMBICORT) 160-4.5 MCG/ACT inhaler Inhale 2 puffs into the lungs 2 (two) times daily for 1 day.  . budesonide-formoterol (SYMBICORT) 80-4.5 MCG/ACT inhaler Inhale 2 puffs into the lungs 2 (two) times a day.  . budesonide-formoterol (SYMBICORT) 80-4.5 MCG/ACT inhaler Inhale 2 puffs into the lungs 2 (two) times daily.  . busPIRone (BUSPAR) 5 MG tablet TAKE ONE TABLET BY MOUTH TWICE A DAY AS NEEDED ANXIETY  . colchicine 0.6 MG tablet Take 1 tablet (0.6 mg total) by mouth 2 (two) times daily.  . cyclobenzaprine (FLEXERIL) 10 MG tablet TAKE ONE TABLET BY MOUTH THREE TIMES A DAY AS NEEDED FOR MUSCLE SPASMS  . ELIQUIS 5 MG TABS tablet TAKE ONE TABLET BY MOUTH TWICE A DAY  . escitalopram (LEXAPRO) 20 MG tablet TAKE ONE TABLET BY MOUTH DAILY  . furosemide (LASIX) 40 MG tablet Take 1-2 tablets (40-80 mg total) by mouth 2 (two) times daily.  Marland Kitchen gabapentin (NEURONTIN) 100 MG capsule Take 1 capsule (100 mg total) by mouth at bedtime.  Marland Kitchen KLOR-CON M20 20 MEQ tablet TAKE ONE TABLET BY MOUTH TWICE A DAY  . levothyroxine (SYNTHROID) 150 MCG tablet TAKE ONE TABLET BY MOUTH DAILY  . montelukast (SINGULAIR) 10 MG tablet Take 1 tablet (10 mg total) by mouth daily.  . Multiple Vitamins-Minerals (MULTIVITAMINS THER. W/MINERALS) TABS Take 1 tablet by mouth daily.  . nortriptyline (PAMELOR) 75 MG capsule TAKE ONE CAPSULE BY MOUTH AT BEDTIME  . predniSONE (DELTASONE) 10 MG tablet Take 2 tabs x 5 days  . propranolol  (INDERAL) 10 MG tablet TAKE ONE TABLET BY MOUTH TWICE A DAY  . Spacer/Aero-Holding Chambers (AEROCHAMBER MV) inhaler Use as instructed  . traMADol (ULTRAM) 50 MG tablet TAKE ONE TABLET BY MOUTH EVERY 6 HOURS AS NEEDED FOR MODERATE PAIN  . traZODone (DESYREL) 50 MG tablet TAKE ONE-HALF TO 1 TABLET BY MOUTH AT BEDTIME AS NEEDED FOR SLEEP   No facility-administered encounter medications on file as of 04/14/2019.     Activities of Daily Living In your present state of health, do you have any difficulty performing the following activities: 04/14/2019 04/14/2018  Hearing? N N  Vision? N N  Difficulty concentrating or making decisions? N N  Walking or climbing stairs? Y N  Comment due to respiratory status -  Dressing or bathing? N N  Doing errands, shopping? N N  Preparing Food and eating ? N -  Using the Toilet? N -  In the past six months, have you accidently leaked urine? N -  Do you have problems with loss of bowel control? N -  Managing your Medications? N -  Managing your Finances? N -  Housekeeping or managing your Housekeeping? N -  Some recent data might be hidden    Patient Care Team: Marin Olp, MD as PCP - General (Family Medicine) Jettie Booze, MD as PCP - Cardiology (Cardiology) Raynelle Bring, MD as Surgeon (Urology) Rigoberto Noel, MD as Consulting Physician (Pulmonary Disease) Trula Slade, DPM as Consulting Physician (Podiatry)   Assessment:   This is a routine wellness examination for Wayne Booth.  Exercise Activities and Dietary recommendations Current Exercise Habits: The patient does not participate in regular exercise at present, Exercise limited by: respiratory conditions(s)  Goals    . Weight (lb) < 250 lb (113.4 kg)       Fall Risk Fall  Risk  04/14/2019 11/17/2018 03/16/2018 04/26/2017 11/27/2016  Falls in the past year? 0 0 No No No  Number falls in past yr: - 0 - - -  Injury with Fall? 0 0 - - -  Risk for fall due to : Impaired  mobility - - - -  Follow up Falls prevention discussed;Education provided;Falls evaluation completed - - - -   Is the patient's home free of loose throw rugs in walkways, pet beds, electrical cords, etc?   yes      Grab bars in the bathroom? yes      Handrails on the stairs?   yes      Adequate lighting?   yes  Depression Screen PHQ 2/9 Scores 04/14/2019 11/17/2018 07/20/2018 06/10/2018  PHQ - 2 Score 0 0 0 1  PHQ- 9 Score - 1 5 9     Cognitive Function- no cognitive concern at this time    6CIT Screen 04/14/2019  What Year? 0 points  What month? 0 points  What time? 0 points  Count back from 20 0 points  Months in reverse 0 points  Repeat phrase 2 points  Total Score 2    Immunization History  Administered Date(s) Administered  . Fluad Quad(high Dose 65+) 03/16/2019  . Influenza Split 04/01/2011, 03/29/2012  . Influenza Whole 04/12/2007, 03/18/2009, 03/04/2010  . Influenza, High Dose Seasonal PF 04/03/2014, 04/04/2015, 04/24/2016, 04/12/2017, 04/26/2018  . Influenza,inj,Quad PF,6+ Mos 03/22/2013  . Influenza-Unspecified 05/29/2017  . Pneumococcal Conjugate-13 11/12/2014  . Pneumococcal Polysaccharide-23 06/09/2011  . Pneumococcal-Unspecified 06/30/2015  . Td 06/29/1994  . Tdap 06/09/2011  . Zoster 09/22/2007  . Zoster Recombinat (Shingrix) 03/16/2019    Qualifies for Shingles Vaccine? Patient has received 1st does and is scheduled for 2nd dose of Shingrix   Screening Tests Health Maintenance  Topic Date Due  . COLONOSCOPY  05/27/2020  . TETANUS/TDAP  06/08/2021  . INFLUENZA VACCINE  Completed  . PNA vac Low Risk Adult  Completed   Cancer Screenings: Lung: Low Dose CT Chest recommended if Age 53-80 years, 30 pack-year currently smoking OR have quit w/in 15years. Patient does not qualify. Colorectal: colonoscopy 05/27/17 with Dr. Hilarie Fredrickson    Plan:  I have personally reviewed and addressed the Medicare Annual Wellness questionnaire and have noted the following in  the patient's chart:  A. Medical and social history B. Use of alcohol, tobacco or illicit drugs  C. Current medications and supplements D. Functional ability and status E.  Nutritional status F.  Physical activity G. Advance directives H. List of other physicians I.  Hospitalizations, surgeries, and ER visits in previous 12 months J.  [place] such as hearing and vision if needed, cognitive and depression L. Referrals, records requested, and appointments- none   In addition, I have reviewed and discussed with patient certain preventive protocols, quality metrics, and best practice recommendations. A written personalized care plan for preventive services as well as general preventive health recommendations were provided to patient.   Signed,  Wayne George, LPN  Nurse Health Advisor   Nurse Notes: Patient with acute pain in bilateral feet.  Scheduled for virtual visit today.

## 2019-04-14 NOTE — Telephone Encounter (Signed)
Patient complaining of bilateral foot pain with burning x 1 week.  States that he does not some edema, redness, and warmth.  His wife currently has Covid.  He has been tested with negative results.

## 2019-04-14 NOTE — Progress Notes (Signed)
    Chief Complaint:  Wayne Booth is a 78 y.o. male who presents for a telephone visit with a chief complaint of foot pain.   Assessment/Plan:  Foot Pain Description sounds consistent with peripheral neuropathy.  Discussed limitations of telephone evaluation with patient and the fact that we are not able to perform physical exam and blood work to rule out other potential causes.  History not consistent with infection.  We will increase gabapentin to 300 mg nightly.  Advised him to follow-up here with PCP within the next week or so for potential blood work and physical exam.  He is not able to come in today due to Covid symptoms and exposure.  Cough COVID test was negative however given constellation of symptoms and known exposure, I think this likely represents false negative.  Currently seems to have mostly mild symptoms.  Does not have any signs of chest pain or shortness of breath.  We will continue with conservative management.  Discussed reasons to return to care and seek emergent care.     Subjective:  HPI:  Foot Pain Started about 2 weeks ago. Described as a burning sensation in the bottom of his feet. No obvious injuries or precipitating events. No weakness or numbness.  He has had something similar in the past.  Is been taking gabapentin for peripheral neuropathy which seems to be helping.  Does not think the area is infected.  No redness.  Feels like a hot burning sensation in the bottom of his bilateral feet.  No back pain.  No weakness or numbness.  No reported bowel or bladder changes.  Cough  Patient recently went to a funeral with his wife about 1.5 weeks ago. Patient started developing symptoms about a week ago. He and his wife were both tested for for COVID. His test was negative but his wife's test was positive.  Other symptoms include chills, rhinorrhea, and chest congestion.  He has underlying pulmonary disease and has shortness of breath at baseline that is not  significantly changed.  No reported chest pain.  ROS: Per HPI  PMH: He reports that he has quit smoking. His smoking use included cigars. He quit after 4.00 years of use. He has never used smokeless tobacco. He reports current alcohol use of about 2.0 standard drinks of alcohol per week. He reports that he does not use drugs.      Objective/Observations   No acute distress, speaking in full sentences. Telephone Visit   I connected with [name] on 04/14/19 at 11:00 AM EDT via telephone and verified that I am speaking with the correct person using two identifiers. I discussed the limitations of evaluation and management by telemedicine and the availability of in person appointments. The patient expressed understanding and agreed to proceed.   Patient location: Home Provider location: [place] participating in the virtual visit: Myself and Patient      Algis Greenhouse. Jerline Pain, MD 04/14/2019 11:33 AM

## 2019-04-14 NOTE — Patient Instructions (Addendum)
Wayne Booth , Thank you for taking time to come for your Medicare Wellness Visit. I appreciate your ongoing commitment to your health goals. Please review the following plan we discussed and let me know if I can assist you in the future.   Screening recommendations/referrals: Colorectal Screening: completed 05/27/17 with Dr. Hilarie Fredrickson   Vision and Dental Exams: Recommended annual ophthalmology exams for early detection of glaucoma and other disorders of the eye Recommended annual dental exams for proper oral hygiene  Vaccinations: Influenza vaccine: completed 03/16/19 Pneumococcal vaccine: up to date; last 11/12/14 Tdap vaccine: up to date; last 06/09/11 Shingles vaccine:1st in series received 03/16/19; 2nd scheduled for January   Advanced directives: Please bring a copy of your POA (Power of [place]) and/or Living Will to your next appointment.  Goals: Recommend to drink at least 6-8 8oz glasses of water per day.  Recommend to remove any items from the home that may cause slips or trips.  Next appointment: Please schedule your Annual Wellness Visit with your Nurse Health Advisor in one year.  Preventive Care 77 Years and Older, Male Preventive care refers to lifestyle choices and visits with your health care provider that can promote health and wellness. What does preventive care include?  A yearly physical exam. This is also called an annual well check.  Dental exams once or twice a year.  Routine eye exams. Ask your health care provider how often you should have your eyes checked.  Personal lifestyle choices, including:  Daily care of your teeth and gums.  Regular physical activity.  Eating a healthy diet.  Avoiding tobacco and drug use.  Limiting alcohol use.  Practicing safe sex.  Taking low doses of aspirin every day if recommended by your health care provider..  Taking vitamin and mineral supplements as recommended by your health care provider. What happens during  an annual well check? The services and screenings done by your health care provider during your annual well check will depend on your age, overall health, lifestyle risk factors, and family history of disease. Counseling  Your health care provider may ask you questions about your:  Alcohol use.  Tobacco use.  Drug use.  Emotional well-being.  Home and relationship well-being.  Sexual activity.  Eating habits.  History of falls.  Memory and ability to understand (cognition).  Work and work Statistician. Screening  You may have the following tests or measurements:  Height, weight, and BMI.  Blood pressure.  Lipid and cholesterol levels. These may be checked every 5 years, or more frequently if you are over 36 years old.  Skin check.  Lung cancer screening. You may have this screening every year starting at age 13 if you have a 30-pack-year history of smoking and currently smoke or have quit within the past 15 years.  Fecal occult blood test (FOBT) of the stool. You may have this test every year starting at age 75.  Flexible sigmoidoscopy or colonoscopy. You may have a sigmoidoscopy every 5 years or a colonoscopy every 10 years starting at age 81.  Prostate cancer screening. Recommendations will vary depending on your family history and other risks.  Hepatitis C blood test.  Hepatitis B blood test.  Sexually transmitted disease (STD) testing.  Diabetes screening. This is done by checking your blood sugar (glucose) after you have not eaten for a while (fasting). You may have this done every 1-3 years.  Abdominal aortic aneurysm (AAA) screening. You may need this if you are a current or former  smoker.  Osteoporosis. You may be screened starting at age 66 if you are at high risk. Talk with your health care provider about your test results, treatment options, and if necessary, the need for more tests. Vaccines  Your health care provider may recommend certain vaccines,  such as:  Influenza vaccine. This is recommended every year.  Tetanus, diphtheria, and acellular pertussis (Tdap, Td) vaccine. You may need a Td booster every 10 years.  Zoster vaccine. You may need this after age 31.  Pneumococcal 13-valent conjugate (PCV13) vaccine. One dose is recommended after age 6.  Pneumococcal polysaccharide (PPSV23) vaccine. One dose is recommended after age 52. Talk to your health care provider about which screenings and vaccines you need and how often you need them. This information is not intended to replace advice given to you by your health care provider. Make sure you discuss any questions you have with your health care provider. Document Released: 07/12/2015 Document Revised: 03/04/2016 Document Reviewed: 04/16/2015 Elsevier Interactive Patient Education  2017 [place] Prevention in the Home Falls can cause injuries. They can happen to people of all ages. There are many things you can do to make your home safe and to help prevent falls. What can I do on the outside of my home?  Regularly fix the edges of walkways and driveways and fix any cracks.  Remove anything that might make you trip as you walk through a door, such as a raised step or threshold.  Trim any bushes or trees on the path to your home.  Use bright outdoor lighting.  Clear any walking paths of anything that might make someone trip, such as rocks or tools.  Regularly check to see if handrails are loose or broken. Make sure that both sides of any steps have handrails.  Any raised decks and porches should have guardrails on the edges.  Have any leaves, snow, or ice cleared regularly.  Use sand or salt on walking paths during winter.  Clean up any spills in your garage right away. This includes oil or grease spills. What can I do in the bathroom?  Use night lights.  Install grab bars by the toilet and in the tub and shower. Do not use towel bars as grab bars.  Use  non-skid mats or decals in the tub or shower.  If you need to sit down in the shower, use a plastic, non-slip stool.  Keep the floor dry. Clean up any water that spills on the floor as soon as it happens.  Remove soap buildup in the tub or shower regularly.  Attach bath mats securely with double-sided non-slip rug tape.  Do not have throw rugs and other things on the floor that can make you trip. What can I do in the bedroom?  Use night lights.  Make sure that you have a light by your bed that is easy to reach.  Do not use any sheets or blankets that are too big for your bed. They should not hang down onto the floor.  Have a firm chair that has side arms. You can use this for support while you get dressed.  Do not have throw rugs and other things on the floor that can make you trip. What can I do in the kitchen?  Clean up any spills right away.  Avoid walking on wet floors.  Keep items that you use a lot in easy-to-reach places.  If you need to reach something above you, use a  strong step stool that has a grab bar.  Keep electrical cords out of the way.  Do not use floor polish or wax that makes floors slippery. If you must use wax, use non-skid floor wax.  Do not have throw rugs and other things on the floor that can make you trip. What can I do with my stairs?  Do not leave any items on the stairs.  Make sure that there are handrails on both sides of the stairs and use them. Fix handrails that are broken or loose. Make sure that handrails are as long as the stairways.  Check any carpeting to make sure that it is firmly attached to the stairs. Fix any carpet that is loose or worn.  Avoid having throw rugs at the top or bottom of the stairs. If you do have throw rugs, attach them to the floor with carpet tape.  Make sure that you have a light switch at the top of the stairs and the bottom of the stairs. If you do not have them, ask someone to add them for you. What  else can I do to help prevent falls?  Wear shoes that:  Do not have high heels.  Have rubber bottoms.  Are comfortable and fit you well.  Are closed at the toe. Do not wear sandals.  If you use a stepladder:  Make sure that it is fully opened. Do not climb a closed stepladder.  Make sure that both sides of the stepladder are locked into place.  Ask someone to hold it for you, if possible.  Clearly mark and make sure that you can see:  Any grab bars or handrails.  First and last steps.  Where the edge of each step is.  Use tools that help you move around (mobility aids) if they are needed. These include:  Canes.  Walkers.  Scooters.  Crutches.  Turn on the lights when you go into a dark area. Replace any light bulbs as soon as they burn out.  Set up your furniture so you have a clear path. Avoid moving your furniture around.  If any of your floors are uneven, fix them.  If there are any pets around you, be aware of where they are.  Review your medicines with your doctor. Some medicines can make you feel dizzy. This can increase your chance of falling. Ask your doctor what other things that you can do to help prevent falls. This information is not intended to replace advice given to you by your health care provider. Make sure you discuss any questions you have with your health care provider. Document Released: 04/11/2009 Document Revised: 11/21/2015 Document Reviewed: 07/20/2014 Elsevier Interactive Patient Education  2017 [employer].

## 2019-04-14 NOTE — Progress Notes (Signed)
I have personally reviewed the Medicare Annual Wellness Visit and agree with the assessment and plan.  Algis Greenhouse. Jerline Pain, MD 04/14/2019 9:59 AM

## 2019-04-16 ENCOUNTER — Other Ambulatory Visit: Payer: Self-pay | Admitting: Podiatry

## 2019-04-17 ENCOUNTER — Ambulatory Visit: Payer: Self-pay

## 2019-04-17 ENCOUNTER — Ambulatory Visit (INDEPENDENT_AMBULATORY_CARE_PROVIDER_SITE_OTHER): Payer: Medicare Other | Admitting: Family Medicine

## 2019-04-17 DIAGNOSIS — J4540 Moderate persistent asthma, uncomplicated: Secondary | ICD-10-CM

## 2019-04-17 DIAGNOSIS — R05 Cough: Secondary | ICD-10-CM

## 2019-04-17 DIAGNOSIS — Z20822 Contact with and (suspected) exposure to covid-19: Secondary | ICD-10-CM

## 2019-04-17 DIAGNOSIS — R062 Wheezing: Secondary | ICD-10-CM

## 2019-04-17 DIAGNOSIS — Z20828 Contact with and (suspected) exposure to other viral communicable diseases: Secondary | ICD-10-CM | POA: Diagnosis not present

## 2019-04-17 DIAGNOSIS — R059 Cough, unspecified: Secondary | ICD-10-CM

## 2019-04-17 MED ORDER — PREDNISONE 50 MG PO TABS: ORAL_TABLET | ORAL | 0 refills | Status: DC

## 2019-04-17 NOTE — Telephone Encounter (Signed)
Patient has been seen.

## 2019-04-17 NOTE — Progress Notes (Signed)
   Chief Complaint:  Wayne Booth is a 78 y.o. male who presents for a telephone visit with a chief complaint of cough.   Assessment/Plan:  Asthma/cough/wheeze/dyspnea/COVID-19 exposure Patient with likely COVID-19.  Explained to patient his recent testing was likely a false negative.  Discussed limited ability to perform full evaluation over the phone.  He voiced understanding.  He seems to be currently clinically stable and does not want to go the ED at this time. He is satting in the mid 90s and above on room air and is able to speak in full sentences over the phone.  Does not sound audibly short of breath.  Will treat as asthma flare.  Will start prednisone 50 mg daily for the next 5 days.  He will continue his albuterol and other asthma medications.  Discussed strict reasons to return to care and seek emergent care.     Subjective:  HPI:  Cough Started a few days ago. He was at a funeral with his wife about 2 weeks ago.  Over last few days has had progressive cough, wheeze, and shortness of breath.  He has underlying history of asthma.  He has been using his albuterol inhaler which helps however wears off after a few hours.  Is also been using his Symbicort daily.  Wife was diagnosed with Covid about a week ago.  He reportedly had a test which was negative.  Symptoms are worsening over the last couple of days.  Does not have any shortness of breath at rest.  Occasional chills.  No fevers.  No body aches.  No other obvious aggravating or alleviating factors.  Home oxygen readings typically in the mid 90s or above.  ROS: Per HPI  PMH: He reports that he has quit smoking. His smoking use included cigars. He quit after 4.00 years of use. He has never used smokeless tobacco. He reports current alcohol use of about 2.0 standard drinks of alcohol per week. He reports that he does not use drugs.      Objective/Observations   No results found for this or any previous visit (from the past 72  hour(s)).   Telephone Visit   I connected with Wayne Booth on 04/17/19 at 11:40 AM EDT via telephone and verified that I am speaking with the correct person using two identifiers. I discussed the limitations of evaluation and management by telemedicine and the availability of in person appointments. The patient expressed understanding and agreed to proceed.   Patient location: Home Provider location: [place] participating in the virtual visit: Myself and Patient      Algis Greenhouse. Jerline Pain, MD 04/17/2019 11:05 AM

## 2019-04-17 NOTE — Telephone Encounter (Signed)
See note

## 2019-04-17 NOTE — Telephone Encounter (Signed)
Patient called stating that he was wheezing and feels chilled. He states that he had a COVID-19 test and the result was negative on 108/2020.  Since he has not felt well. He states he is wheezing which concerns him. He feels SOB and coughing a lot. He is unsure if he has fever but he does feel chilled.  Care advice was read to patient. He verbalized understanding. Call was transferred to office for scheduling.  Reason for Disposition . [1] Continuous (nonstop) coughing interferes with work or school AND [2] no improvement using cough treatment per protocol  Answer Assessment - Initial Assessment Questions 1. COVID-19 DIAGNOSIS: "Who made your Coronavirus (COVID-19) diagnosis?" "Was it confirmed by a positive lab test?" If not diagnosed by a HCP, ask "Are there lots of cases (community spread) where you live?" (See public health department website, if unsure)     [place] 2. ONSET: "When did the COVID-19 symptoms start?"     12th 3. WORST SYMPTOM: "What is your worst symptom?" (e.g., cough, fever, shortness of breath, muscle aches)     Wheezing and cough 4. COUGH: "Do you have a cough?" If so, ask: "How bad is the cough?"       severe 5. FEVER: "Do you have a fever?" If so, ask: "What is your temperature, how was it measured, and when did it start?"     Feverish but unsure 6. RESPIRATORY STATUS: "Describe your breathing?" (e.g., shortness of breath, wheezing, unable to speak)      Sob wheezing 7. BETTER-SAME-WORSE: "Are you getting better, staying the same or getting worse compared to yesterday?"  If getting worse, ask, "In what way?"     same 8. HIGH RISK DISEASE: "Do you have any chronic medical problems?" (e.g., asthma, heart or lung disease, weak immune system, etc.)     Weakened immune system 9. PREGNANCY: "Is there any chance you are pregnant?" "When was your last menstrual period?"    N/A 10. OTHER SYMPTOMS: "Do you have any other symptoms?"  (e.g., chills, fatigue, headache, loss of  smell or taste, muscle pain, sore throat)      Fatigue,chills,  Protocols used: CORONAVIRUS (COVID-19) DIAGNOSED OR SUSPECTED-A-AH

## 2019-04-18 ENCOUNTER — Telehealth: Payer: Self-pay | Admitting: Family Medicine

## 2019-04-19 ENCOUNTER — Telehealth: Payer: Self-pay | Admitting: Adult Health

## 2019-04-19 ENCOUNTER — Other Ambulatory Visit: Payer: Self-pay

## 2019-04-19 ENCOUNTER — Encounter: Payer: Self-pay | Admitting: Adult Health

## 2019-04-19 ENCOUNTER — Ambulatory Visit (INDEPENDENT_AMBULATORY_CARE_PROVIDER_SITE_OTHER): Payer: Medicare Other | Admitting: Adult Health

## 2019-04-19 DIAGNOSIS — E66813 Obesity, class 3: Secondary | ICD-10-CM

## 2019-04-19 DIAGNOSIS — I5032 Chronic diastolic (congestive) heart failure: Secondary | ICD-10-CM

## 2019-04-19 DIAGNOSIS — Z6841 Body Mass Index (BMI) 40.0 and over, adult: Secondary | ICD-10-CM

## 2019-04-19 DIAGNOSIS — J4540 Moderate persistent asthma, uncomplicated: Secondary | ICD-10-CM | POA: Diagnosis not present

## 2019-04-19 DIAGNOSIS — J9611 Chronic respiratory failure with hypoxia: Secondary | ICD-10-CM | POA: Diagnosis not present

## 2019-04-19 MED ORDER — BUDESONIDE-FORMOTEROL FUMARATE 160-4.5 MCG/ACT IN AERO: 2.0000 | INHALATION_SPRAY | Freq: Two times a day (BID) | RESPIRATORY_TRACT | 0 refills | Status: DC

## 2019-04-19 MED ORDER — BUDESONIDE-FORMOTEROL FUMARATE 160-4.5 MCG/ACT IN AERO: 2.0000 | INHALATION_SPRAY | Freq: Two times a day (BID) | RESPIRATORY_TRACT | 6 refills | Status: DC

## 2019-04-19 MED ORDER — AZITHROMYCIN 250 MG PO TABS: ORAL_TABLET | ORAL | 0 refills | Status: DC

## 2019-04-19 NOTE — Telephone Encounter (Signed)
I confirmed spouse, [name] is on DPR   She states that she feels like pt is doing much worse than what he let Tammy know about at his televisit today  She states that he gasps for air when he exerts himself without o2  I explained to her that we have advised the pt to use o2 with any exertion so this should help with that  She states that she also thinks that the pt has covid  They were both exposed to covid at a funeral and she tested pos on 04/10/19 and he tested neg but his PCP told them it was most likely a false positive  Will forward to TP as FYI

## 2019-04-19 NOTE — Addendum Note (Signed)
Addended by: Parke Poisson E on: 04/19/2019 11:42 AM   Modules accepted: Orders

## 2019-04-19 NOTE — Progress Notes (Signed)
Virtual Visit via Telephone Note  I connected with Wayne Booth on 04/19/19 at 10:30 AM EDT by telephone and verified that I am speaking with the correct person using two identifiers.  Location: Patient: Home  Provider: Office    I discussed the limitations, risks, security and privacy concerns of performing an evaluation and management service by telephone and the availability of in person appointments. I also discussed with the patient that there may be a patient responsible charge related to this service. The patient expressed understanding and agreed to proceed.   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 fib, renal cell carcinoma status post nephrectomy, diastolic heart failure   Today's televisit is for an acute office visit. He complains over last week of increased cough and congestion , thick mucus. More dyspnea with activity . No increased leg swelling or wt gain.  He was seen 1 month ago with presumed diastolic decompensation, was given extra lasix with some improvement. Chest xray and labs were unrevealing for acute process.  Patient was referred to cardiology , he rescheduled this to later date . He was set up for PFT which he rescheduled.  He admits today he is not taking symbicort on regular basis and not using oxygen but as needed on occasion.  Wife tested positive for COVID 19 on 10/3. He has been tested x 2 (last 10/12 ) both have been negative. He denies fever, loss of taste or smell.    Observations/Objective:  CT chest June 08, 2017 showed a small left lower lobe area of consolidation.  PFT 07/2017 moderate restriction, DLCO 67%  CT chest October 2019 small to moderate  bilateral pleural effusions consolidations within the left greater than right lower lobe and lingula. Hazy density in the left greater than right lower lobe.  2D echo February 04, 2018 showed EF 50 to 55%  12/2018 HRCT Chest -patchy parenchymal groundglass with interstitial coarsening bilaterally similar to 2019 from a multi lobar pneumonia felt to be a component of postinflammatory changes not consistent with UIP.   Assessment and Plan: Acute Asthmatic Bronchitis - flare  Increased ICS/LABA and needs improved compliance  Covid 19 test x 2 negative. If develops more acute sx , fever , etc will need to consider retesting.   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 (hold colchicine for 1 week while taking azithromycin )  Mucinex DM Twice daily  As needed  Cough /congestion  Increase Symbicort 160 2 puffs Twice daily  -please take as directed.  Albuterol Inhaler 2 puffs every 4hrs as needed for wheezing .  Please wear your  Oxygen 2l/m with activity and At bedtime   Low salt diet . Legs elevated .  Work on weight loss .  Weigh daily , keep log .  Advance activity as tolerated  Keep follow up with Cardiology as planned.  Follow up with Dr. Elsworth Soho  Or Leontina Skidmore NP in 4-6 weeks and As needed  . Keep PFT appointment .  Please contact office for sooner follow up if symptoms do not improve or worsen or seek emergency care        ' Follow  Up Instructions: Follow up in 4-6 weeks as planned with PFT and As needed   Please contact office for sooner follow up if symptoms do not improve or worsen or seek emergency care     I discussed the assessment and treatment plan with the patient. The patient was provided an opportunity to ask questions and all were answered. The patient agreed with the plan and demonstrated an understanding of the instructions.   The patient was  advised to call back or seek an in-person evaluation if the symptoms worsen or if the condition fails to improve as anticipated.  I provided 24  minutes of non-face-to-face time during this encounter.   Rexene Edison, NP

## 2019-04-19 NOTE — Patient Instructions (Addendum)
Zpack take as directed. (hold colchicine for 1 week while taking azithromycin )  Mucinex DM Twice daily  As needed  Cough /congestion  Increase Symbicort 160 2 puffs Twice daily  -please take as directed.  Albuterol Inhaler 2 puffs every 4hrs as needed for wheezing .  Please wear your  Oxygen 2l/m with activity and At bedtime   Low salt diet . Legs elevated .  Work on weight loss .  Weigh daily , keep log .  Advance activity as tolerated  Keep follow up with Cardiology as planned.  Follow up with Dr. Elsworth Soho  Or [place]rett NP in 4-6 weeks and As needed  . Keep PFT appointment .  Please contact office for sooner follow up if symptoms do not improve or worsen or seek emergency care

## 2019-04-20 ENCOUNTER — Encounter (HOSPITAL_COMMUNITY): Payer: Self-pay | Admitting: Emergency Medicine

## 2019-04-20 ENCOUNTER — Other Ambulatory Visit: Payer: Self-pay

## 2019-04-20 ENCOUNTER — Inpatient Hospital Stay (HOSPITAL_COMMUNITY)
Admission: EM | Admit: 2019-04-20 | Discharge: 2019-04-28 | DRG: 177 | Disposition: A | Discharge status: Home Health | Payer: Medicare Other | Attending: Family Medicine | Admitting: Family Medicine

## 2019-04-20 ENCOUNTER — Emergency Department (HOSPITAL_COMMUNITY): Payer: Medicare Other

## 2019-04-20 DIAGNOSIS — J9622 Acute and chronic respiratory failure with hypercapnia: Secondary | ICD-10-CM | POA: Diagnosis not present

## 2019-04-20 DIAGNOSIS — N1831 Chronic kidney disease, stage 3a: Secondary | ICD-10-CM | POA: Diagnosis not present

## 2019-04-20 DIAGNOSIS — N179 Acute kidney failure, unspecified: Secondary | ICD-10-CM | POA: Diagnosis not present

## 2019-04-20 DIAGNOSIS — M109 Gout, unspecified: Secondary | ICD-10-CM | POA: Diagnosis not present

## 2019-04-20 DIAGNOSIS — J9621 Acute and chronic respiratory failure with hypoxia: Secondary | ICD-10-CM | POA: Diagnosis not present

## 2019-04-20 DIAGNOSIS — Z905 Acquired absence of kidney: Secondary | ICD-10-CM

## 2019-04-20 DIAGNOSIS — E871 Hypo-osmolality and hyponatremia: Secondary | ICD-10-CM | POA: Diagnosis not present

## 2019-04-20 DIAGNOSIS — I2781 Cor pulmonale (chronic): Secondary | ICD-10-CM | POA: Diagnosis not present

## 2019-04-20 DIAGNOSIS — G4733 Obstructive sleep apnea (adult) (pediatric): Secondary | ICD-10-CM | POA: Diagnosis present

## 2019-04-20 DIAGNOSIS — Z9981 Dependence on supplemental oxygen: Secondary | ICD-10-CM | POA: Diagnosis not present

## 2019-04-20 DIAGNOSIS — R0602 Shortness of breath: Secondary | ICD-10-CM | POA: Diagnosis not present

## 2019-04-20 DIAGNOSIS — I480 Paroxysmal atrial fibrillation: Secondary | ICD-10-CM | POA: Diagnosis not present

## 2019-04-20 DIAGNOSIS — Z85528 Personal history of other malignant neoplasm of kidney: Secondary | ICD-10-CM

## 2019-04-20 DIAGNOSIS — J1289 Other viral pneumonia: Secondary | ICD-10-CM

## 2019-04-20 DIAGNOSIS — I10 Essential (primary) hypertension: Secondary | ICD-10-CM | POA: Diagnosis present

## 2019-04-20 DIAGNOSIS — F329 Major depressive disorder, single episode, unspecified: Secondary | ICD-10-CM | POA: Diagnosis present

## 2019-04-20 DIAGNOSIS — E039 Hypothyroidism, unspecified: Secondary | ICD-10-CM | POA: Diagnosis present

## 2019-04-20 DIAGNOSIS — Z86711 Personal history of pulmonary embolism: Secondary | ICD-10-CM

## 2019-04-20 DIAGNOSIS — Z6838 Body mass index (BMI) 38.0-38.9, adult: Secondary | ICD-10-CM

## 2019-04-20 DIAGNOSIS — J189 Pneumonia, unspecified organism: Secondary | ICD-10-CM | POA: Diagnosis not present

## 2019-04-20 DIAGNOSIS — R0609 Other forms of dyspnea: Secondary | ICD-10-CM

## 2019-04-20 DIAGNOSIS — Z96653 Presence of artificial knee joint, bilateral: Secondary | ICD-10-CM | POA: Diagnosis present

## 2019-04-20 DIAGNOSIS — Z800 Family history of malignant neoplasm of digestive organs: Secondary | ICD-10-CM

## 2019-04-20 DIAGNOSIS — Z981 Arthrodesis status: Secondary | ICD-10-CM | POA: Diagnosis not present

## 2019-04-20 DIAGNOSIS — Z7901 Long term (current) use of anticoagulants: Secondary | ICD-10-CM

## 2019-04-20 DIAGNOSIS — R0902 Hypoxemia: Secondary | ICD-10-CM

## 2019-04-20 DIAGNOSIS — E875 Hyperkalemia: Secondary | ICD-10-CM | POA: Diagnosis not present

## 2019-04-20 DIAGNOSIS — J8410 Pulmonary fibrosis, unspecified: Secondary | ICD-10-CM | POA: Diagnosis present

## 2019-04-20 DIAGNOSIS — R05 Cough: Secondary | ICD-10-CM | POA: Diagnosis not present

## 2019-04-20 DIAGNOSIS — Z8546 Personal history of malignant neoplasm of prostate: Secondary | ICD-10-CM

## 2019-04-20 DIAGNOSIS — Z8051 Family history of malignant neoplasm of kidney: Secondary | ICD-10-CM

## 2019-04-20 DIAGNOSIS — I2510 Atherosclerotic heart disease of native coronary artery without angina pectoris: Secondary | ICD-10-CM | POA: Diagnosis present

## 2019-04-20 DIAGNOSIS — J1282 Pneumonia due to coronavirus disease 2019: Secondary | ICD-10-CM | POA: Diagnosis present

## 2019-04-20 DIAGNOSIS — F4310 Post-traumatic stress disorder, unspecified: Secondary | ICD-10-CM | POA: Diagnosis present

## 2019-04-20 DIAGNOSIS — I130 Hypertensive heart and chronic kidney disease with heart failure and stage 1 through stage 4 chronic kidney disease, or unspecified chronic kidney disease: Secondary | ICD-10-CM | POA: Diagnosis present

## 2019-04-20 DIAGNOSIS — J4540 Moderate persistent asthma, uncomplicated: Secondary | ICD-10-CM | POA: Diagnosis not present

## 2019-04-20 DIAGNOSIS — E890 Postprocedural hypothyroidism: Secondary | ICD-10-CM | POA: Diagnosis not present

## 2019-04-20 DIAGNOSIS — Z7951 Long term (current) use of inhaled steroids: Secondary | ICD-10-CM

## 2019-04-20 DIAGNOSIS — U071 COVID-19: Secondary | ICD-10-CM | POA: Diagnosis not present

## 2019-04-20 DIAGNOSIS — Z79890 Hormone replacement therapy (postmenopausal): Secondary | ICD-10-CM

## 2019-04-20 DIAGNOSIS — I5033 Acute on chronic diastolic (congestive) heart failure: Secondary | ICD-10-CM | POA: Diagnosis not present

## 2019-04-20 DIAGNOSIS — J90 Pleural effusion, not elsewhere classified: Secondary | ICD-10-CM | POA: Diagnosis not present

## 2019-04-20 DIAGNOSIS — R0789 Other chest pain: Secondary | ICD-10-CM | POA: Diagnosis not present

## 2019-04-20 DIAGNOSIS — Z79899 Other long term (current) drug therapy: Secondary | ICD-10-CM

## 2019-04-20 DIAGNOSIS — R0600 Dyspnea, unspecified: Secondary | ICD-10-CM

## 2019-04-20 DIAGNOSIS — Z87891 Personal history of nicotine dependence: Secondary | ICD-10-CM

## 2019-04-20 DIAGNOSIS — F411 Generalized anxiety disorder: Secondary | ICD-10-CM | POA: Diagnosis present

## 2019-04-20 LAB — CBC WITH DIFFERENTIAL/PLATELET
Abs Immature Granulocytes: 0.13 10*3/uL — ABNORMAL HIGH (ref 0.00–0.07)
Basophils Absolute: 0.1 10*3/uL (ref 0.0–0.1)
Basophils Relative: 1 %
Eosinophils Absolute: 0.2 10*3/uL (ref 0.0–0.5)
Eosinophils Relative: 3 %
HCT: 53.9 % — ABNORMAL HIGH (ref 39.0–52.0)
Hemoglobin: 18.1 g/dL — ABNORMAL HIGH (ref 13.0–17.0)
Immature Granulocytes: 2 %
Lymphocytes Relative: 16 %
Lymphs Abs: 1 10*3/uL (ref 0.7–4.0)
MCH: 31.4 pg (ref 26.0–34.0)
MCHC: 33.6 g/dL (ref 30.0–36.0)
MCV: 93.6 fL (ref 80.0–100.0)
Monocytes Absolute: 0.4 10*3/uL (ref 0.1–1.0)
Monocytes Relative: 6 %
Neutro Abs: 4.8 10*3/uL (ref 1.7–7.7)
Neutrophils Relative %: 72 %
Platelets: 274 10*3/uL (ref 150–400)
RBC: 5.76 MIL/uL (ref 4.22–5.81)
RDW: 14.5 % (ref 11.5–15.5)
WBC: 6.6 10*3/uL (ref 4.0–10.5)
nRBC: 0 % (ref 0.0–0.2)

## 2019-04-20 LAB — SARS CORONAVIRUS 2 BY RT PCR (HOSPITAL ORDER, PERFORMED IN ~~LOC~~ HOSPITAL LAB): SARS Coronavirus 2: POSITIVE — AB

## 2019-04-20 LAB — BASIC METABOLIC PANEL
Anion gap: 10 (ref 5–15)
BUN: 20 mg/dL (ref 8–23)
CO2: 26 mmol/L (ref 22–32)
Calcium: 8.2 mg/dL — ABNORMAL LOW (ref 8.9–10.3)
Chloride: 94 mmol/L — ABNORMAL LOW (ref 98–111)
Creatinine, Ser: 1.75 mg/dL — ABNORMAL HIGH (ref 0.61–1.24)
GFR calc Af Amer: 43 mL/min — ABNORMAL LOW (ref >60)
GFR calc non Af Amer: 37 mL/min — ABNORMAL LOW (ref >60)
Glucose, Bld: 96 mg/dL (ref 70–99)
Potassium: 4.9 mmol/L (ref 3.5–5.1)
Sodium: 130 mmol/L — ABNORMAL LOW (ref 135–145)

## 2019-04-20 LAB — HEPATIC FUNCTION PANEL
ALT: 39 U/L (ref 0–44)
AST: 63 U/L — ABNORMAL HIGH (ref 15–41)
Albumin: 3.1 g/dL — ABNORMAL LOW (ref 3.5–5.0)
Alkaline Phosphatase: 78 U/L (ref 38–126)
Bilirubin, Direct: 0.7 mg/dL — ABNORMAL HIGH (ref 0.0–0.2)
Indirect Bilirubin: 1.2 mg/dL — ABNORMAL HIGH (ref 0.3–0.9)
Total Bilirubin: 1.9 mg/dL — ABNORMAL HIGH (ref 0.3–1.2)
Total Protein: 7.5 g/dL (ref 6.5–8.1)

## 2019-04-20 LAB — LACTIC ACID, PLASMA
Lactic Acid, Venous: 2 mmol/L (ref 0.5–1.9)
Lactic Acid, Venous: 1.3 mmol/L (ref 0.5–1.9)

## 2019-04-20 LAB — BRAIN NATRIURETIC PEPTIDE: B Natriuretic Peptide: 91.8 pg/mL (ref 0.0–100.0)

## 2019-04-20 LAB — PROTIME-INR
INR: 1.3 — ABNORMAL HIGH (ref 0.8–1.2)
Prothrombin Time: 16.1 seconds — ABNORMAL HIGH (ref 11.4–15.2)

## 2019-04-20 LAB — MAGNESIUM: Magnesium: 2.1 mg/dL (ref 1.7–2.4)

## 2019-04-20 LAB — TROPONIN I (HIGH SENSITIVITY)
Troponin I (High Sensitivity): 49 ng/L — ABNORMAL HIGH (ref <18)
Troponin I (High Sensitivity): 28 ng/L — ABNORMAL HIGH (ref <18)

## 2019-04-20 LAB — APTT: aPTT: 35 seconds (ref 24–36)

## 2019-04-20 MED ORDER — SODIUM CHLORIDE 0.9 % IV BOLUS
500.0000 mL | Freq: Once | INTRAVENOUS | Status: AC
  Administered 2019-04-20: 500 mL via INTRAVENOUS

## 2019-04-20 MED ORDER — AEROCHAMBER PLUS FLO-VU MEDIUM MISC
1.0000 | Freq: Once | Status: AC
  Administered 2019-04-20: 1
  Filled 2019-04-20: qty 1

## 2019-04-20 MED ORDER — SODIUM CHLORIDE 0.9 % IV SOLN
1.0000 g | Freq: Once | INTRAVENOUS | Status: AC
  Administered 2019-04-20: 1 g via INTRAVENOUS
  Filled 2019-04-20: qty 10

## 2019-04-20 MED ORDER — SODIUM CHLORIDE 0.9 % IV SOLN
200.0000 mg | Freq: Once | INTRAVENOUS | Status: AC
  Administered 2019-04-20: 200 mg via INTRAVENOUS
  Filled 2019-04-20: qty 40

## 2019-04-20 MED ORDER — SODIUM CHLORIDE 0.9 % IV SOLN
100.0000 mg | INTRAVENOUS | Status: AC
  Administered 2019-04-21 – 2019-04-24 (×4): 100 mg via INTRAVENOUS
  Filled 2019-04-20 (×4): qty 20

## 2019-04-20 MED ORDER — METHYLPREDNISOLONE SODIUM SUCC 125 MG IJ SOLR
125.0000 mg | Freq: Once | INTRAMUSCULAR | Status: AC
  Administered 2019-04-20: 125 mg via INTRAVENOUS
  Filled 2019-04-20: qty 2

## 2019-04-20 MED ORDER — IPRATROPIUM-ALBUTEROL 20-100 MCG/ACT IN AERS
1.0000 | INHALATION_SPRAY | Freq: Four times a day (QID) | RESPIRATORY_TRACT | Status: DC
  Administered 2019-04-20 – 2019-04-28 (×31): 1 via RESPIRATORY_TRACT
  Filled 2019-04-20: qty 4

## 2019-04-20 MED ORDER — SODIUM CHLORIDE 0.9 % IV SOLN
500.0000 mg | Freq: Once | INTRAVENOUS | Status: AC
  Administered 2019-04-20: 500 mg via INTRAVENOUS
  Filled 2019-04-20: qty 500

## 2019-04-20 MED ORDER — MAGNESIUM SULFATE 2 GM/50ML IV SOLN
2.0000 g | Freq: Once | INTRAVENOUS | Status: AC
  Administered 2019-04-20: 2 g via INTRAVENOUS
  Filled 2019-04-20: qty 50

## 2019-04-20 MED ORDER — ALBUTEROL SULFATE HFA 108 (90 BASE) MCG/ACT IN AERS
2.0000 | INHALATION_SPRAY | Freq: Once | RESPIRATORY_TRACT | Status: AC
  Administered 2019-04-20: 2 via RESPIRATORY_TRACT
  Filled 2019-04-20: qty 6.7

## 2019-04-20 NOTE — Progress Notes (Addendum)
Notified bedside nurse of need to draw lactic acid. Adella Hare, RN.

## 2019-04-20 NOTE — Telephone Encounter (Signed)
Called and spoke to pt's wife, [name]. Informed her of the recs per TP. She verbalized understanding and states she will talk with pt and get him to the ED. Advised her to call back as needed. Pt verbalized understanding and denied any further questions or concerns at this time.

## 2019-04-20 NOTE — Telephone Encounter (Signed)
I am fine with him being tested again , he has had 2 negative test. (10/8 and 10/12) .   If he is getting worse and concern for COVID, needs to go to ER for evaluation as hard to assess on the phone , needs repeat CXR , labs, COVID test and evaluation for hypoxemia defintiely needs to be compliant with Oxygen .  It is possible he had a false negative or got tested too early in the exposure.   Please contact office for sooner follow up if symptoms do not improve or worsen or seek emergency care

## 2019-04-20 NOTE — Progress Notes (Signed)
Pharmacy: Remdesivir   Patient is a 78 y.o. M with COVID.  Pharmacy has been consulted for remdesivir dosing.   - ALT: 39 - CXR shows: Patchy infiltrate most prominent in the right upper lobe - Pt is requiring supplemental oxygen: Yes, up to 4 L per MD note  A/P:  - Patient meets criteria for remdesivir. Will initiate remdesivir 200 mg once followed by 100 mg daily x 4 days.  - Daily CMET while on remdesivir - Will f/u pt's ALT and clinical condition  Lenis Noon, PharmD 04/20/19 7:38 PM

## 2019-04-20 NOTE — ED Provider Notes (Signed)
[facility] DEPT Provider Note   CSN: [id number] Arrival date & time: 04/20/19  1306     History   Chief Complaint Chief Complaint  Patient presents with  . Shortness of Breath    HPI Wayne Booth is a 78 y.o. male.     78yo male with past medical history of asthma, OSA, a fib (on Eliquis), HTN, CAD presents with complaint of shortness of breath. Patient states symptoms started a few weeks ago but became progressively worse this week with significant dyspnea of exertion. Patient is on [facility] home O2 PRN and states he generally does not need it until this week when he has been using his oxygen constantly. Patient called his provider who prescribed prednisone and Zithromax which he is taking without improvement. Reports cough which is occasionally productive with thick sputum, chest tightness. Denies fevers, chills, lower extremity edema, loss of sense of taste/smell, diarrhea, body aches. Patient was exposed to his wife who has tested positive for COVID, patient has had 2 negative tests this month. No other complaints or concerns.   Wayne Booth was evaluated in Emergency Department on 04/20/2019 for the symptoms described in the history of present illness. He was evaluated in the context of the global COVID-19 pandemic, which necessitated consideration that the patient might be at risk for infection with the SARS-CoV-2 virus that causes COVID-19. Institutional protocols and algorithms that pertain to the evaluation of patients at risk for COVID-19 are in a state of rapid change based on information released by regulatory bodies including the CDC and federal and state organizations. These policies and algorithms were followed during the patient's care in the ED.      Past Medical History:  Diagnosis Date  . Allergy    States his nose runs when he is around grease.   Marland Kitchen Anxiety   . Arthritis   . Atrial fibrillation ([place])   . Cancer ([place])   .  Coronary artery disease   . History of total knee replacement   . Hypertension   . Hypothyroidism   . Kidney cancer, primary, with metastasis from kidney to other site [facility])   . Kidney stone   . OSA (obstructive sleep apnea)    pt does not wear cpap at night  . Pituitary cyst ([place])   . Pneumonia    hx of  . Prostate cancer [facility])     Patient Active Problem List   Diagnosis Date Noted  . PTSD (post-traumatic stress disorder) 07/27/2018  . Memory loss 06/10/2018  . Aortic atherosclerosis ([place]) 05/10/2018  . Chronic respiratory failure with hypoxia ([place]) 04/26/2018  . First degree AV block 04/15/2018  . Pleural effusion 04/14/2018  . CAD (coronary artery disease) 04/09/2018  . Chronic pain 04/09/2018  . CKD (chronic kidney disease) stage 3, GFR 30-59 ml/min   . Dyspnea   . HCAP (healthcare-associated pneumonia) 02/03/2018  . Constipation 01/21/2018  . (HFpEF) heart failure with preserved ejection fraction ([place]) 12/16/2017  . Chest pain   . Anticoagulated   . Atrial flutter ([place]) 11/01/2017  . Pain in left knee 07/15/2017  . Bilateral foot pain 07/13/2017  . Community acquired pneumonia 06/18/2017  . 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 VTE (venous thromboembolism) 06/09/2017  . History of adenomatous polyp of colon 04/12/2017  . Chronic pulmonary embolism ([place]) 10/23/2016  . Cervical disc disease 11/13/2015  . Essential tremor 08/14/2015  . Gout 08/14/2015  . Hypothyroidism 07/12/2014  . Hyperglycemia 07/12/2014  . GAD (  generalized anxiety disorder) 11/07/2013  . OSA (obstructive sleep apnea) 07/07/2011  . Asthmatic bronchitis 06/09/2011  . Actinic keratosis 11/27/2009  . Osteoarthritis 11/13/2008  . History of prostate cancer-seed implantation 2010.  Dr. Alinda Money 10/02/2008  . Morbid obesity ([place]) 10/02/2008  . History of renal cell carcinoma 12/26/2007  . Insomnia 12/26/2007  . TESTOSTERONE DEFICIENCY 12/28/2006  . Essential hypertension 12/28/2006    Past  Surgical History:  Procedure Laterality Date  . ABLATION OF DYSRHYTHMIC FOCUS  01/28/2018  . ANTERIOR CERVICAL DECOMP/DISCECTOMY FUSION N/A 08/29/2012   Procedure: ANTERIOR CERVICAL DECOMPRESSION/DISCECTOMY FUSION 1 LEVEL;  Surgeon: Eustace Moore, MD;  Location: [facility] NEURO ORS;  Service: Neurosurgery;  Laterality: N/A;  . APPENDECTOMY    . ATRIAL FIBRILLATION ABLATION N/A 01/28/2018   Procedure: ATRIAL FIBRILLATION ABLATION;  Surgeon: Constance Haw, MD;  Location: [facility] CV LAB;  Service: Cardiovascular;  Laterality: N/A;  . CARDIAC CATHETERIZATION  2008   clean  . CHOLECYSTECTOMY    . COLONOSCOPY W/ POLYPECTOMY    . CRANIOTOMY N/A 11/08/2012   Procedure: CRANIOTOMY HYPOPHYSECTOMY TRANSNASAL APPROACH;  Surgeon: Faythe Ghee, MD;  Location: [place] NEURO ORS;  Service: Neurosurgery;  Laterality: N/A;  Transphenoidal Resection of Pituitary Tumor  . FINGER SURGERY Left 2017   Dr Amedeo Plenty  . INSERTION PROSTATE RADIATION SEED    . IR THORACENTESIS ASP PLEURAL SPACE W/IMG GUIDE  04/15/2018  . KIDNEY STONE SURGERY    . KNEE ARTHROSCOPY  2007   left  . NEPHRECTOMY Left   . POSTERIOR CERVICAL LAMINECTOMY Left 04/24/2015   Procedure: Foraminotomy cervical five - cervical six cervical six - cervical seven left;  Surgeon: Eustace Moore, MD;  Location: [facility] NEURO ORS;  Service: Neurosurgery;  Laterality: Left;  . REPLACEMENT TOTAL KNEE BILATERAL          Home Medications    Prior to Admission medications   Medication Sig Start Date End Date Taking? Authorizing Provider  albuterol (VENTOLIN HFA) 108 (90 Base) MCG/ACT inhaler Inhale 2 puffs into the lungs every 6 (six) hours as needed for wheezing or shortness of breath. 01/23/19  Yes Martyn Ehrich, NP  budesonide-formoterol [facility]) 160-4.5 MCG/ACT inhaler Inhale 2 puffs into the lungs 2 (two) times daily. 04/19/19  Yes Parrett, Tammy S, NP  busPIRone (BUSPAR) 5 MG tablet TAKE ONE TABLET BY MOUTH TWICE A DAY AS NEEDED ANXIETY Patient  taking differently: Take 5 mg by mouth 2 (two) times daily as needed (anxiety).  12/26/18  Yes Marin Olp, MD  colchicine 0.6 MG tablet Take 1 tablet (0.6 mg total) by mouth 2 (two) times daily. 03/04/18  Yes Angiulli, Lavon Paganini, PA-C  cyclobenzaprine (FLEXERIL) 10 MG tablet TAKE ONE TABLET BY MOUTH THREE TIMES A DAY AS NEEDED FOR MUSCLE SPASMS Patient taking differently: Take 10 mg by mouth 3 (three) times daily as needed for muscle spasms.  01/19/19  Yes Marin Olp, MD  ELIQUIS 5 MG TABS tablet TAKE ONE TABLET BY MOUTH TWICE A DAY Patient taking differently: Take 5 mg by mouth 2 (two) times daily.  04/10/19  Yes Marin Olp, MD  escitalopram (LEXAPRO) 20 MG tablet TAKE ONE TABLET BY MOUTH DAILY Patient taking differently: Take 20 mg by mouth daily.  02/27/19  Yes Marin Olp, MD  furosemide (LASIX) 40 MG tablet Take 1-2 tablets (40-80 mg total) by mouth 2 (two) times daily. 07/27/18  Yes Marin Olp, MD  gabapentin (NEURONTIN) 100 MG capsule Take 3 capsules (300 mg  total) by mouth at bedtime. 04/14/19  Yes Vivi Barrack, MD  montelukast (SINGULAIR) 10 MG tablet Take 1 tablet (10 mg total) by mouth daily. 10/31/18  Yes Parrett, Fonnie Mu, NP  Multiple Vitamins-Minerals (MULTIVITAMINS THER. W/MINERALS) TABS Take 1 tablet by mouth daily.   Yes [provider]  nortriptyline (PAMELOR) 75 MG capsule TAKE ONE CAPSULE BY MOUTH AT BEDTIME Patient taking differently: Take 75 mg by mouth at bedtime.  04/10/19  Yes Marin Olp, MD  traMADol (ULTRAM) 50 MG tablet TAKE ONE TABLET BY MOUTH EVERY 6 HOURS AS NEEDED FOR MODERATE PAIN Patient taking differently: Take 50 mg by mouth every 6 (six) hours as needed for moderate pain.  03/08/19  Yes Marin Olp, MD  traZODone (DESYREL) 50 MG tablet TAKE ONE-HALF TO 1 TABLET BY MOUTH AT BEDTIME AS NEEDED FOR SLEEP Patient taking differently: Take 25-50 mg by mouth at bedtime as needed for sleep.  11/14/18  Yes Marin Olp,  MD  azithromycin (ZITHROMAX Z-PAK) 250 MG tablet Take 2 tablets (500 mg) on  Day 1,  followed by 1 tablet (250 mg) once daily on Days 2 through 5. Patient not taking: Reported on 04/20/2019 04/19/19 04/24/19  Parrett, Fonnie Mu, NP  budesonide-formoterol (SYMBICORT) 160-4.5 MCG/ACT inhaler Inhale 2 puffs into the lungs 2 (two) times daily. 04/19/19   Parrett, Tammy S, NP  KLOR-CON M20 20 MEQ tablet TAKE ONE TABLET BY MOUTH TWICE A DAY Patient taking differently: Take 20 mEq by mouth 2 (two) times daily.  03/08/19   Marin Olp, MD  levothyroxine (SYNTHROID) 150 MCG tablet TAKE ONE TABLET BY MOUTH DAILY Patient taking differently: Take 150 mcg by mouth daily before breakfast.  04/10/19   Marin Olp, MD  predniSONE (DELTASONE) 50 MG tablet Take 1 tablet daily for 5 days. 04/17/19   Vivi Barrack, MD  propranolol (INDERAL) 10 MG tablet TAKE ONE TABLET BY MOUTH TWICE A DAY Patient taking differently: Take 10 mg by mouth 2 (two) times daily.  03/28/19   Marin Olp, MD  Spacer/Aero-Holding Chambers (AEROCHAMBER MV) inhaler Use as instructed 01/11/19   Parrett, Fonnie Mu, NP    Family History Family History  Problem Relation Age of Onset  . Cancer Father        ?mouth, died before patient born  . Cancer Mother        stomach, died when he was young  . Kidney cancer Sister   . Colon cancer Brother 91  . Esophageal cancer Neg Hx   . Rectal cancer Neg Hx   . Stomach cancer Neg Hx     Social History Social History   Tobacco Use  . Smoking status: Former Smoker    Years: 4.00    Types: Cigars  . Smokeless tobacco: Never Used  . Tobacco comment: smoked cigars 1-2x per year; QUIT 2 YEARS AGO   2017  Substance Use Topics  . Alcohol use: Yes    Alcohol/week: 2.0 standard drinks    Types: 2 Shots of liquor per week    Comment: socially  . Drug use: No     Allergies   Ambien [zolpidem]   Review of Systems Review of Systems  Constitutional: Negative for chills,  diaphoresis and fever.  HENT: Negative for congestion, rhinorrhea, sinus pressure, sinus pain, sneezing and sore throat.   Respiratory: Positive for cough, chest tightness and shortness of breath. Negative for wheezing.   Cardiovascular: Negative for chest pain, palpitations and leg  swelling.  Musculoskeletal: Negative for arthralgias and myalgias.  Skin: Negative for rash and wound.  Allergic/Immunologic: Negative for immunocompromised state.  Neurological: Negative for weakness and headaches.  Psychiatric/Behavioral: Negative for confusion.  All other systems reviewed and are negative.    Physical Exam Updated Vital Signs BP 111/78   Pulse 94   Temp 98.5 F (36.9 C) (Oral)   Resp (!) 36   SpO2 90%   Physical Exam Vitals signs and nursing note reviewed.  Constitutional:      General: He is not in acute distress.    Appearance: He is well-developed. He is not diaphoretic.  HENT:     Head: Normocephalic and atraumatic.  Cardiovascular:     Rate and Rhythm: Normal rate and regular rhythm.  Pulmonary:     Effort: Tachypnea present.     Breath sounds: No decreased breath sounds or wheezing.  Chest:     Chest wall: No tenderness.  Abdominal:     Palpations: Abdomen is soft.     Tenderness: There is no abdominal tenderness.  Musculoskeletal: Normal range of motion.     Right lower leg: He exhibits no tenderness. No edema.     Left lower leg: He exhibits no tenderness. No edema.  Skin:    General: Skin is warm and dry.     Findings: No erythema or rash.  Neurological:     Mental Status: He is alert and oriented to person, place, and time.  Psychiatric:        Behavior: Behavior normal.      ED Treatments / Results  Labs (all labs ordered are listed, but only abnormal results are displayed) Labs Reviewed  BASIC METABOLIC PANEL - Abnormal; Notable for the following components:      Result Value   Sodium 130 (*)    Chloride 94 (*)    Creatinine, Ser 1.75 (*)     Calcium 8.2 (*)    GFR calc non Af Amer 37 (*)    GFR calc Af Amer 43 (*)    All other components within normal limits  LACTIC ACID, PLASMA - Abnormal; Notable for the following components:   Lactic Acid, Venous 2.0 (*)    All other components within normal limits  HEPATIC FUNCTION PANEL - Abnormal; Notable for the following components:   Albumin 3.1 (*)    AST 63 (*)    Total Bilirubin 1.9 (*)    Bilirubin, Direct 0.7 (*)    Indirect Bilirubin 1.2 (*)    All other components within normal limits  TROPONIN I (HIGH SENSITIVITY) - Abnormal; Notable for the following components:   Troponin I (High Sensitivity) 49 (*)    All other components within normal limits  SARS CORONAVIRUS 2 BY RT PCR (HOSPITAL ORDER, [place] [facility])  CULTURE, BLOOD (ROUTINE X 2)  CULTURE, BLOOD (ROUTINE X 2)  URINE CULTURE  MAGNESIUM  BRAIN NATRIURETIC PEPTIDE  CBC WITH DIFFERENTIAL/PLATELET  LACTIC ACID, PLASMA  URINALYSIS, ROUTINE W REFLEX MICROSCOPIC  CBC WITH DIFFERENTIAL/PLATELET  PROTIME-INR  APTT  TROPONIN I (HIGH SENSITIVITY)    EKG EKG Interpretation  Date/Time:  Thursday April 20 2019 13:29:34 EDT Ventricular Rate:  85 PR Interval:    QRS Duration: 99 QT Interval:  402 QTC Calculation: 478 R Axis:   -58 Text Interpretation:  Sinus rhythm Prolonged PR interval Inferior infarct, old Consider anterolateral infarct Baseline wander in lead(s) II V3 V4 V6 Confirmed by Milton Ferguson [id number]) on 04/20/2019 6:33:10 PM  Radiology Dg Chest Portable 1 View  Result Date: 04/20/2019 CLINICAL DATA:  Shortness of breath, history of recent COVID-19 positivity EXAM: PORTABLE CHEST 1 VIEW COMPARISON:  03/15/2019 FINDINGS: Cardiac shadow is prominent but accentuated by the portable technique. Aortic calcifications are again seen. Patchy parenchymal opacity is noted most prominent in the right upper lobe. No bony abnormality is seen. IMPRESSION: Patchy infiltrate most prominent in  the right upper lobe. Electronically Signed   By: Inez Catalina M.D.   On: 04/20/2019 14:27    Procedures .Critical Care Performed by: Tacy Learn, PA-C Authorized by: Tacy Learn, PA-C   Critical care provider statement:    Critical care time (minutes):  45   Critical care was necessary to treat or prevent imminent or life-threatening deterioration of the following conditions:  Sepsis   Critical care was time spent personally by me on the following activities:  Discussions with consultants, evaluation of patient's response to treatment, examination of patient, ordering and performing treatments and interventions, ordering and review of laboratory studies, ordering and review of radiographic studies, pulse oximetry, re-evaluation of patient's condition, obtaining history from patient or surrogate and review of old charts   (including critical care time)  Medications Ordered in ED Medications  Ipratropium-Albuterol (COMBIVENT) respimat 1 puff (1 puff Inhalation Given 04/20/19 1609)  azithromycin (ZITHROMAX) 500 mg in sodium chloride 0.9 % 250 mL IVPB (500 mg Intravenous New Bag/Given 04/20/19 1832)  albuterol (VENTOLIN HFA) 108 (90 Base) MCG/ACT inhaler 2 puff (2 puffs Inhalation Given 04/20/19 1610)  AeroChamber Plus Flo-Vu Medium MISC 1 each (1 each Other Given 04/20/19 1611)  cefTRIAXone (ROCEPHIN) 1 g in sodium chloride 0.9 % 100 mL IVPB (1 g Intravenous New Bag/Given 04/20/19 1603)  methylPREDNISolone sodium succinate (SOLU-MEDROL) 125 mg/2 mL injection 125 mg (125 mg Intravenous Given 04/20/19 1611)  magnesium sulfate IVPB 2 g 50 mL (0 g Intravenous Stopped 04/20/19 1639)  sodium chloride 0.9 % bolus 500 mL (500 mLs Intravenous New Bag/Given 04/20/19 1538)     Initial Impression / Assessment and Plan / ED Course  I have reviewed the triage vital signs and the nursing notes.  Pertinent labs & imaging results that were available during my care of the patient were reviewed by  me and considered in my medical decision making (see chart for details).  Clinical Course as of Apr 19 1842  Thu Apr 20, 2019  1813 77yo male with history of asthma presents from home with worsening SHOB and dyspnea on exertion. Patient had a virtual visit earlier this week with his provider, is taking prednisone, zithromax and his inhalers as prescribed without improvement. [name] recently tested positive for COVID, patient has had 2 recent negative COVID tests.  On exam, patient is tachypneic with a respiratory rate of 40, lungs clear, no lower extremity edema, reports chest feels tight but not painful.  Patient uses oxygen 2 L nasal cannula as needed, states he has needed it constantly for the past week, does not help with his dyspnea on exertion. Code sepsis initiated, CXR with patchy infiltrate most prominent in the right upper lobe.  Review of lab work, patient's troponin is elevated at 49, BMP with creatinine 1.75, sodium of 130, hepatic function with mildly elevated AST, bilirubin 1.9.  Lactic acid is 2.0.  Magnesium 2.1. Patient was given albuterol and Combivent inhaler treatments as well as magnesium IV. Patient was given Rocephin and Zithromax for his pneumonia. Patient has been a difficult stick for lab work, CBC  is awaiting recollection. Patient is feeling better after his inhaler treatments and IV fluids, remains with oxygen saturation of 88 to 90% on room air, respiratory rate has improved to mid 20s.  Pressure remained stable, currently 111/78. Plan is to admit patient when his CBC results.  Care signed out to Dr. Roderic Palau awaiting repeat labs.   [LM]    Clinical Course User Index [LM] Tacy Learn, PA-C      Final Clinical Impressions(s) / ED Diagnoses   Final diagnoses:  Dyspnea on exertion  Community acquired pneumonia of right lung, unspecified part of lung  Hypoxemia    ED Discharge Orders    None       Roque Lias 04/20/19 1843    Milton Ferguson, MD 04/23/19 [phone]

## 2019-04-20 NOTE — ED Triage Notes (Signed)
Pt c/o SOB esp with exertion for several weeks but got worse here recently. Was negative for Covid last week. Was was positive for Covid 3 weeks ago.

## 2019-04-20 NOTE — Telephone Encounter (Signed)
Wayne Booth son is returning phone call.  Phone  Number is [phone].

## 2019-04-20 NOTE — Telephone Encounter (Signed)
lmtcb fot pt.

## 2019-04-20 NOTE — H&P (Signed)
History and Physical   Wayne Booth [id number] DOB: 1941/02/15 DOA: 04/20/2019  Referring MD/NP/PA: Dr. Roderic Palau  PCP: Marin Olp, MD   Outpatient Specialists: None  Patient coming from: Home  Chief Complaint: Shortness of breath  HPI: Wayne Booth is a 78 y.o. male with medical history significant of asthma on 2 L of oxygen on and off as needed, morbid obesity, gout, obstructive sleep apnea, atrial fibrillation on chronic anticoagulation, history of prostate and renal carcinoma, hypothyroidism, hypertension and coronary artery disease who presented to the ER with significant shortness of breath and cough as well as increased oxygen requirement.  He has been requiring 2 L as indicated but now up to 4 L in the ER.  She he is progressively very short of breath.  His wife tested for Covid 19 over the last 2 weeks.  He has tested negative x2 last test was October 12.  Patient however has become progressively sick.  Mild fever and chills.  He is largely having dyspnea even at rest.  In the ER patient was noted to be tachypneic basically and requiring more oxygen.  His COVID-19 test is back positive.  Patient is being admitted with pneumonia due to COVID-19.  He denied any hemoptysis.  Mild chest wall pain..  ED Course: Temperature is 98.5 blood pressure 105/64 pulse 94 respiratory rate of 39 oxygen sat 89% on 2 L currently 94% on 4 L.  Sodium is 130 potassium 4.8 chloride 94 CO2 26 glucose 96 BUN 20 creatinine 1.75.  Calcium is 8.2 with magnesium 2.1 with a gap of 10.  LFTs are reasonably within normal total bilirubin is 0.7.  Troponin is 49 and lactic acid 2.0.  COVID-19 screen is positive.  Chest x-ray shows patchy infiltrate most prominent in the right upper lobe.  Patient will be admitted for inpatient care with COVID-19 pneumonia  Review of Systems: As per HPI otherwise 10 point review of systems negative.    Past Medical History:  Diagnosis Date  . Allergy    States his nose  runs when he is around grease.   Marland Kitchen Anxiety   . Arthritis   . Atrial fibrillation ([place])   . Cancer ([place])   . Coronary artery disease   . History of total knee replacement   . Hypertension   . Hypothyroidism   . Kidney cancer, primary, with metastasis from kidney to other site [facility])   . Kidney stone   . OSA (obstructive sleep apnea)    pt does not wear cpap at night  . Pituitary cyst ([place])   . Pneumonia    hx of  . Prostate cancer [facility])     Past Surgical History:  Procedure Laterality Date  . ABLATION OF DYSRHYTHMIC FOCUS  01/28/2018  . ANTERIOR CERVICAL DECOMP/DISCECTOMY FUSION N/A 08/29/2012   Procedure: ANTERIOR CERVICAL DECOMPRESSION/DISCECTOMY FUSION 1 LEVEL;  Surgeon: Eustace Moore, MD;  Location: [place] [facility]URO ORS;  Service: Neurosurgery;  Laterality: N/A;  . APPENDECTOMY    . ATRIAL FIBRILLATION ABLATION N/A 01/28/2018   Procedure: ATRIAL FIBRILLATION ABLATION;  Surgeon: Constance Haw, MD;  Location: [place] CV LAB;  Service: Cardiovascular;  Laterality: N/A;  . CARDIAC CATHETERIZATION  2008   clean  . CHOLECYSTECTOMY    . COLONOSCOPY W/ POLYPECTOMY    . CRANIOTOMY N/A 11/08/2012   Procedure: CRANIOTOMY HYPOPHYSECTOMY TRANSNASAL APPROACH;  Surgeon: Faythe Ghee, MD;  Location: [place] [facility]URO ORS;  Service: Neurosurgery;  Laterality: N/A;  Transphenoidal Resection of Pituitary  Tumor  . FINGER SURGERY Left 2017   Dr Amedeo Plenty  . INSERTION PROSTATE RADIATION SEED    . IR THORACENTESIS ASP PLEURAL SPACE W/IMG GUIDE  04/15/2018  . KIDNEY STONE SURGERY    . KNEE ARTHROSCOPY  2007   left  . NEPHRECTOMY Left   . POSTERIOR CERVICAL LAMINECTOMY Left 04/24/2015   Procedure: Foraminotomy cervical five - cervical six cervical six - cervical seven left;  Surgeon: Eustace Moore, MD;  Location: [facility];  Service: Neurosurgery;  Laterality: Left;  . REPLACEMENT TOTAL KNEE BILATERAL       reports that he has quit smoking. His smoking use included cigars. He quit after 4.00 years of  use. He has never used smokeless tobacco. He reports current alcohol use of about 2.0 standard drinks of alcohol per week. He reports that he does not use drugs.  Allergies  Allergen Reactions  . Ambien [Zolpidem] Anxiety    Anxiety and agitation when tried as sleeper in hospital aug 2019    Family History  Problem Relation Age of Onset  . Cancer Father        ?mouth, died before patient born  . Cancer Mother        stomach, died when he was young  . Kidney cancer Sister   . Colon cancer Brother 72  . Esophageal cancer Neg Hx   . Rectal cancer Neg Hx   . Stomach cancer Neg Hx      Prior to Admission medications   Medication Sig Start Date End Date Taking? Authorizing Provider  albuterol (VENTOLIN HFA) 108 (90 Base) MCG/ACT inhaler Inhale 2 puffs into the lungs every 6 (six) hours as needed for wheezing or shortness of breath. 01/23/19  Yes Martyn Ehrich, NP  budesonide-formoterol [facility]) 160-4.5 MCG/ACT inhaler Inhale 2 puffs into the lungs 2 (two) times daily. 04/19/19  Yes Parrett, Tammy S, NP  busPIRone (BUSPAR) 5 MG tablet TAKE ONE TABLET BY MOUTH TWICE A DAY AS NEEDED ANXIETY Patient taking differently: Take 5 mg by mouth 2 (two) times daily as needed (anxiety).  12/26/18  Yes Marin Olp, MD  colchicine 0.6 MG tablet Take 1 tablet (0.6 mg total) by mouth 2 (two) times daily. 03/04/18  Yes Angiulli, Lavon Paganini, PA-C  cyclobenzaprine (FLEXERIL) 10 MG tablet TAKE ONE TABLET BY MOUTH THREE TIMES A DAY AS NEEDED FOR MUSCLE SPASMS Patient taking differently: Take 10 mg by mouth 3 (three) times daily as needed for muscle spasms.  01/19/19  Yes Marin Olp, MD  ELIQUIS 5 MG TABS tablet TAKE ONE TABLET BY MOUTH TWICE A DAY Patient taking differently: Take 5 mg by mouth 2 (two) times daily.  04/10/19  Yes Marin Olp, MD  escitalopram (LEXAPRO) 20 MG tablet TAKE ONE TABLET BY MOUTH DAILY Patient taking differently: Take 20 mg by mouth daily.  02/27/19  Yes Marin Olp, MD  furosemide (LASIX) 40 MG tablet Take 1-2 tablets (40-80 mg total) by mouth 2 (two) times daily. 07/27/18  Yes Marin Olp, MD  gabapentin (NEURONTIN) 100 MG capsule Take 3 capsules (300 mg total) by mouth at bedtime. 04/14/19  Yes Vivi Barrack, MD  montelukast (SINGULAIR) 10 MG tablet Take 1 tablet (10 mg total) by mouth daily. 10/31/18  Yes Parrett, Fonnie Mu, NP  Multiple Vitamins-Minerals (MULTIVITAMINS THER. W/MINERALS) TABS Take 1 tablet by mouth daily.   Yes [provider]  nortriptyline (PAMELOR) 75 MG capsule TAKE ONE CAPSULE BY MOUTH AT  BEDTIME Patient taking differently: Take 75 mg by mouth at bedtime.  04/10/19  Yes Marin Olp, MD  traMADol (ULTRAM) 50 MG tablet TAKE ONE TABLET BY MOUTH EVERY 6 HOURS AS NEEDED FOR MODERATE PAIN Patient taking differently: Take 50 mg by mouth every 6 (six) hours as needed for moderate pain.  03/08/19  Yes Marin Olp, MD  traZODone (DESYREL) 50 MG tablet TAKE ONE-HALF TO 1 TABLET BY MOUTH AT BEDTIME AS NEEDED FOR SLEEP Patient taking differently: Take 25-50 mg by mouth at bedtime as needed for sleep.  11/14/18  Yes Marin Olp, MD  azithromycin (ZITHROMAX Z-PAK) 250 MG tablet Take 2 tablets (500 mg) on  Day 1,  followed by 1 tablet (250 mg) once daily on Days 2 through 5. Patient not taking: Reported on 04/20/2019 04/19/19 04/24/19  Parrett, Fonnie Mu, NP  budesonide-formoterol (SYMBICORT) 160-4.5 MCG/ACT inhaler Inhale 2 puffs into the lungs 2 (two) times daily. 04/19/19   Parrett, Tammy S, NP  KLOR-CON M20 20 MEQ tablet TAKE ONE TABLET BY MOUTH TWICE A DAY Patient taking differently: Take 20 mEq by mouth 2 (two) times daily.  03/08/19   Marin Olp, MD  levothyroxine (SYNTHROID) 150 MCG tablet TAKE ONE TABLET BY MOUTH DAILY Patient taking differently: Take 150 mcg by mouth daily before breakfast.  04/10/19   Marin Olp, MD  predniSONE (DELTASONE) 50 MG tablet Take 1 tablet daily for 5 days. 04/17/19    Vivi Barrack, MD  propranolol (INDERAL) 10 MG tablet TAKE ONE TABLET BY MOUTH TWICE A DAY Patient taking differently: Take 10 mg by mouth 2 (two) times daily.  03/28/19   Marin Olp, MD  Spacer/Aero-Holding Chambers (AEROCHAMBER MV) inhaler Use as instructed 01/11/19   Melvenia Needles, NP    Physical Exam: Vitals:   04/20/19 1731 04/20/19 1804 04/20/19 1806 04/20/19 1810  BP:  111/78  111/78  Pulse: 94 94 94   Resp: 18 (!) 26 (!) 36   Temp:      TempSrc:      SpO2: 90% (!) 89% 90%       Constitutional: Very anxious, morbidly obese sitting in the chair Vitals:   04/20/19 1731 04/20/19 1804 04/20/19 1806 04/20/19 1810  BP:  111/78  111/78  Pulse: 94 94 94   Resp: 18 (!) 26 (!) 36   Temp:      TempSrc:      SpO2: 90% (!) 89% 90%    Eyes: PERRL, lids and conjunctivae normal ENMT: Mucous membranes are dry. Posterior pharynx clear of any exudate or lesions.Normal dentition.  Neck: normal, supple, no masses, no thyromegaly Respiratory: Decreased air entry with coarse breath sounds, expiratory wheezing and mild rhonchi Cardiovascular: Regular rate and rhythm, no murmurs / rubs / gallops. No extremity edema. 2+ pedal pulses. No carotid bruits.  Abdomen: Obese, no tenderness, no masses palpated. No hepatosplenomegaly. Bowel sounds positive.  Musculoskeletal: no clubbing / cyanosis. No joint deformity upper and lower extremities. Good ROM, no contractures. Normal muscle tone.  Skin: no rashes, lesions, ulcers. No induration Neurologic: CN 2-12 grossly intact. Sensation intact, DTR normal. Strength 5/5 in all 4.  Psychiatric: Normal judgment and insight. Alert and oriented x 3.  Very anxious    Labs on Admission: I have personally reviewed following labs and imaging studies  CBC: No results for input(s): WBC, NEUTROABS, HGB, HCT, MCV, PLT in the last 168 hours. Basic Metabolic Panel: Recent Labs  Lab 04/20/19 1601  NA  130*  K 4.9  CL 94*  CO2 26  GLUCOSE 96  BUN  20  CREATININE 1.75*  CALCIUM 8.2*  MG 2.1   GFR: CrCl cannot be calculated (Unknown ideal weight.). Liver Function Tests: Recent Labs  Lab 04/20/19 1601  AST 63*  ALT 39  ALKPHOS 78  BILITOT 1.9*  PROT 7.5  ALBUMIN 3.1*   No results for input(s): LIPASE, AMYLASE in the last 168 hours. No results for input(s): AMMONIA in the last 168 hours. Coagulation Profile: No results for input(s): INR, PROTIME in the last 168 hours. Cardiac Enzymes: No results for input(s): CKTOTAL, CKMB, CKMBINDEX, TROPONINI in the last 168 hours. BNP (last 3 results) Recent Labs    11/23/18 1103 01/24/19 1024 03/15/19 1209  PROBNP 23.0 27.0 31.0   HbA1C: No results for input(s): HGBA1C in the last 72 hours. CBG: No results for input(s): GLUCAP in the last 168 hours. Lipid Profile: No results for input(s): CHOL, HDL, LDLCALC, TRIG, CHOLHDL, LDLDIRECT in the last 72 hours. Thyroid Function Tests: No results for input(s): TSH, T4TOTAL, FREET4, T3FREE, THYROIDAB in the last 72 hours. Anemia Panel: No results for input(s): VITAMINB12, FOLATE, FERRITIN, TIBC, IRON, RETICCTPCT in the last 72 hours. Urine analysis:    Component Value Date/Time   COLORURINE YELLOW 04/08/2018 2126   APPEARANCEUR CLEAR 04/08/2018 2126   LABSPEC 1.015 04/08/2018 2126   PHURINE 5.0 04/08/2018 2126   GLUCOSEU NEGATIVE 04/08/2018 2126   HGBUR NEGATIVE 04/08/2018 2126   HGBUR 2+ 09/22/2007 1444   BILIRUBINUR NEGATIVE 04/08/2018 2126   BILIRUBINUR n 11/07/2015 1417   KETONESUR NEGATIVE 04/08/2018 2126   PROTEINUR NEGATIVE 04/08/2018 2126   UROBILINOGEN 1.0 11/07/2015 1417   UROBILINOGEN 1.0 05/22/2011 1454   NITRITE NEGATIVE 04/08/2018 2126   LEUKOCYTESUR NEGATIVE 04/08/2018 2126   Sepsis Labs: @LABRCNTIP (procalcitonin:4,lacticidven:4) ) Recent Results (from the past 240 hour(s))  SARS Coronavirus 2 by RT PCR (hospital order, performed in [place] hospital lab) Nasopharyngeal Nasopharyngeal Swab     Status:  Abnormal   Collection Time: 04/20/19  4:01 PM   Specimen: Nasopharyngeal Swab  Result Value Ref Range Status   SARS Coronavirus 2 POSITIVE (A) NEGATIVE Final    Comment: RESULT CALLED TO, READ BACK BY AND VERIFIED WITH: HALL,C. RN @1846  ON 10.22.2020 BY COHEN,K (NOTE) If result is NEGATIVE SARS-CoV-2 target nucleic acids are NOT DETECTED. The SARS-CoV-2 RNA is generally detectable in upper and lower  respiratory specimens during the acute phase of infection. The lowest  concentration of SARS-CoV-2 viral copies this assay can detect is 250  copies / mL. A negative result does not preclude SARS-CoV-2 infection  and should not be used as the sole basis for treatment or other  patient management decisions.  A negative result may occur with  improper specimen collection / handling, submission of specimen other  than nasopharyngeal swab, presence of viral mutation(s) within the  areas targeted by this assay, and inadequate number of viral copies  (<250 copies / mL). A negative result must be combined with clinical  observations, patient history, and epidemiological information. If result is POSITIVE SARS-CoV-2 target nucleic acids are DETECT ED. The SARS-CoV-2 RNA is generally detectable in upper and lower  respiratory specimens during the acute phase of infection.  Positive  results are indicative of active infection with SARS-CoV-2.  Clinical  correlation with patient history and other diagnostic information is  necessary to determine patient infection status.  Positive results do  not rule out bacterial infection or co-infection with  other viruses. If result is PRESUMPTIVE POSTIVE SARS-CoV-2 nucleic acids MAY BE PRESENT.   A presumptive positive result was obtained on the submitted specimen  and confirmed on repeat testing.  While 2019 novel coronavirus  (SARS-CoV-2) nucleic acids may be present in the submitted sample  additional confirmatory testing may be necessary for  epidemiological  and / or clinical management purposes  to differentiate between  SARS-CoV-2 and other Sarbecovirus currently known to infect humans.  If clinically indicated additional testing with an alternate test  methodology (LAB74 53) is advised. The SARS-CoV-2 RNA is generally  detectable in upper and lower respiratory specimens during the acute  phase of infection. The expected result is Negative. Fact Sheet for Patients:  StrictlyIdeas.no Fact Sheet for Healthcare Providers: BankingDealers.co.za This test is not yet approved or cleared by the [place] FDA and has been authorized for detection and/or diagnosis of SARS-CoV-2 by FDA under an Emergency Use Authorization (EUA).  This EUA will remain in effect (meaning this test can be used) for the duration of the COVID-19 declaration under Section 564(b)(1) of the Act, 21 U.S.C. section 360bbb-3(b)(1), unless the authorization is terminated or revoked sooner. Performed at [facility], [place] [address] [place], [place] [address]      Radiological Exams on Admission: Dg Chest Portable 1 View  Result Date: 04/20/2019 CLINICAL DATA:  Shortness of breath, history of recent COVID-19 positivity EXAM: PORTABLE CHEST 1 VIEW COMPARISON:  03/15/2019 FINDINGS: Cardiac shadow is prominent but accentuated by the portable technique. Aortic calcifications are again seen. Patchy parenchymal opacity is noted most prominent in the right upper lobe. No bony abnormality is seen. IMPRESSION: Patchy infiltrate most prominent in the right upper lobe. Electronically Signed   By: Inez Catalina M.D.   On: 04/20/2019 14:27    EKG: Independently reviewed.  It shows sinus rhythm with low voltage EKG rate of 85 nonspecific ST change  Assessment/Plan Principal Problem:   Pneumonia due to COVID-19 virus Active Problems:   History of prostate cancer-seed implantation 2010.  Dr. Alinda Money    History of renal cell carcinoma   Morbid obesity ([place])   Essential hypertension   OSA (obstructive sleep apnea)   Hypothyroidism   CAD (coronary artery disease)   Hyponatremia   Acute on chronic respiratory failure with hypoxia and hypercapnia ([place])     #1 COVID-19 pneumonia: Patient will be admitted to the [place].  Initiate remdesivir, oxygen, dexamethasone, IV Rocephin and Zithromax as well as albuterol MDI.  Supportive care will be initiated and continued with.  #2 acute on chronic respiratory failure: Patient has history of asthma with ongoing oxygen use.  Worsened now with COVID-19 pneumonia.  Continue oxygen and breathing treatments  #3 hyponatremia: Appears to be dehydrated.  Currently on diuretics.  We will hold on diuretics.  #4 coronary artery disease: Stable.  Mildly elevated troponin.  Cycle troponin.  Appears to be probably due to stress induced.  No EKG change  #5 hypothyroidism: Replete and continue home regimen  #6 morbid obesity: Stable.  #7 obstructive sleep apnea: May require CPAP at night.  #8 essential hypertension: Continue home regimen.  #9 PTSD with generalized anxiety disorder: Continue home regimen and counseling  #10 atrial fibrillation: Patient on chronic Eliquis.  We will continue.  Rate is controlled   DVT prophylaxis: Eliquis Code Status: Full code Family Communication: Wife over the phone Disposition Plan: To be determined Consults called: None Admission status: Inpatient  Severity of Illness: The appropriate patient  status for this patient is INPATIENT. Inpatient status is judged to be reasonable and necessary in order to provide the required intensity of service to ensure the patient's safety. The patient's presenting symptoms, physical exam findings, and initial radiographic and laboratory data in the context of their chronic comorbidities is felt to place them at high risk for further clinical deterioration. Furthermore, it is  not anticipated that the patient will be medically stable for discharge from the hospital within 2 midnights of admission. The following factors support the patient status of inpatient.   " The patient's presenting symptoms include shortness of breath and cough. " The worrisome physical exam findings include morbidly obese with decreased air entry bilaterally. " The initial radiographic and laboratory data are worrisome because of positive for COVID-19 and chest x-ray findings. " The chronic co-morbidities include morbid obesity and asthma.   * I certify that at the point of admission it is my clinical judgment that the patient will require inpatient hospital care spanning beyond 2 midnights from the point of admission due to high intensity of service, high risk for further deterioration and high frequency of surveillance required.Barbette Merino MD [facility] Pager [phone]  If 7PM-7AM, please contact night-coverage [email or web address] Password [facility]  04/20/2019, 7:19 PM

## 2019-04-20 NOTE — ED Notes (Signed)
Date and time results received: 04/20/19 4:51 PM  (use smartphrase ".now" to insert current time)  Test: Lactic Critical Value: 2.0  Name of Provider Notified: [place]  Orders Received? Or Actions Taken?: awaiting orders

## 2019-04-20 NOTE — ED Notes (Signed)
Date and time results received: 04/20/19 6:47 PM  (use smartphrase ".now" to insert current time)  Test:Covid  Critical Value: positive  Name of Provider Notified: Becky Sax Rn   Orders Received? Or Actions Taken?:

## 2019-04-21 DIAGNOSIS — G4733 Obstructive sleep apnea (adult) (pediatric): Secondary | ICD-10-CM | POA: Diagnosis not present

## 2019-04-21 DIAGNOSIS — J9621 Acute and chronic respiratory failure with hypoxia: Secondary | ICD-10-CM

## 2019-04-21 DIAGNOSIS — I2510 Atherosclerotic heart disease of native coronary artery without angina pectoris: Secondary | ICD-10-CM | POA: Diagnosis not present

## 2019-04-21 DIAGNOSIS — I5033 Acute on chronic diastolic (congestive) heart failure: Secondary | ICD-10-CM | POA: Diagnosis not present

## 2019-04-21 DIAGNOSIS — N179 Acute kidney failure, unspecified: Secondary | ICD-10-CM | POA: Diagnosis not present

## 2019-04-21 DIAGNOSIS — I480 Paroxysmal atrial fibrillation: Secondary | ICD-10-CM | POA: Diagnosis not present

## 2019-04-21 DIAGNOSIS — Z981 Arthrodesis status: Secondary | ICD-10-CM | POA: Diagnosis not present

## 2019-04-21 DIAGNOSIS — I2781 Cor pulmonale (chronic): Secondary | ICD-10-CM | POA: Diagnosis not present

## 2019-04-21 DIAGNOSIS — M109 Gout, unspecified: Secondary | ICD-10-CM | POA: Diagnosis not present

## 2019-04-21 DIAGNOSIS — E039 Hypothyroidism, unspecified: Secondary | ICD-10-CM | POA: Diagnosis not present

## 2019-04-21 DIAGNOSIS — Z86711 Personal history of pulmonary embolism: Secondary | ICD-10-CM | POA: Diagnosis not present

## 2019-04-21 DIAGNOSIS — N1831 Chronic kidney disease, stage 3a: Secondary | ICD-10-CM | POA: Diagnosis not present

## 2019-04-21 DIAGNOSIS — J4540 Moderate persistent asthma, uncomplicated: Secondary | ICD-10-CM | POA: Diagnosis not present

## 2019-04-21 DIAGNOSIS — J9622 Acute and chronic respiratory failure with hypercapnia: Secondary | ICD-10-CM

## 2019-04-21 DIAGNOSIS — Z9981 Dependence on supplemental oxygen: Secondary | ICD-10-CM | POA: Diagnosis not present

## 2019-04-21 DIAGNOSIS — J1289 Other viral pneumonia: Secondary | ICD-10-CM | POA: Diagnosis not present

## 2019-04-21 DIAGNOSIS — I10 Essential (primary) hypertension: Secondary | ICD-10-CM | POA: Diagnosis not present

## 2019-04-21 DIAGNOSIS — E871 Hypo-osmolality and hyponatremia: Secondary | ICD-10-CM | POA: Diagnosis not present

## 2019-04-21 DIAGNOSIS — U071 COVID-19: Secondary | ICD-10-CM | POA: Diagnosis not present

## 2019-04-21 DIAGNOSIS — Z85528 Personal history of other malignant neoplasm of kidney: Secondary | ICD-10-CM | POA: Diagnosis not present

## 2019-04-21 DIAGNOSIS — I130 Hypertensive heart and chronic kidney disease with heart failure and stage 1 through stage 4 chronic kidney disease, or unspecified chronic kidney disease: Secondary | ICD-10-CM | POA: Diagnosis not present

## 2019-04-21 LAB — URINALYSIS, ROUTINE W REFLEX MICROSCOPIC
Bilirubin Urine: NEGATIVE
Glucose, UA: NEGATIVE mg/dL
Ketones, ur: NEGATIVE mg/dL
Leukocytes,Ua: NEGATIVE
Nitrite: NEGATIVE
Protein, ur: NEGATIVE mg/dL
Specific Gravity, Urine: 1.017 (ref 1.005–1.030)
pH: 5 (ref 5.0–8.0)

## 2019-04-21 MED ORDER — TRAZODONE HCL 50 MG PO TABS
25.0000 mg | ORAL_TABLET | Freq: Every evening | ORAL | Status: DC | PRN
  Administered 2019-04-21 – 2019-04-27 (×7): 50 mg via ORAL
  Filled 2019-04-21 (×8): qty 1

## 2019-04-21 MED ORDER — ESCITALOPRAM OXALATE 10 MG PO TABS
20.0000 mg | ORAL_TABLET | Freq: Every day | ORAL | Status: DC
  Administered 2019-04-21 – 2019-04-28 (×8): 20 mg via ORAL
  Filled 2019-04-21 (×9): qty 2

## 2019-04-21 MED ORDER — CYCLOBENZAPRINE HCL 10 MG PO TABS
10.0000 mg | ORAL_TABLET | Freq: Three times a day (TID) | ORAL | Status: DC | PRN
  Administered 2019-04-22 – 2019-04-27 (×6): 10 mg via ORAL
  Filled 2019-04-21 (×6): qty 1

## 2019-04-21 MED ORDER — BUSPIRONE HCL 5 MG PO TABS
5.0000 mg | ORAL_TABLET | Freq: Two times a day (BID) | ORAL | Status: DC | PRN
  Administered 2019-04-21 – 2019-04-27 (×8): 5 mg via ORAL
  Filled 2019-04-21 (×10): qty 1

## 2019-04-21 MED ORDER — ZINC SULFATE 220 (50 ZN) MG PO CAPS
220.0000 mg | ORAL_CAPSULE | Freq: Every day | ORAL | Status: DC
  Administered 2019-04-21 – 2019-04-28 (×8): 220 mg via ORAL
  Filled 2019-04-21 (×8): qty 1

## 2019-04-21 MED ORDER — POTASSIUM CHLORIDE CRYS ER 20 MEQ PO TBCR: 20.0000 meq | EXTENDED_RELEASE_TABLET | Freq: Two times a day (BID) | ORAL | Status: DC

## 2019-04-21 MED ORDER — MOMETASONE FURO-FORMOTEROL FUM 200-5 MCG/ACT IN AERO: 2.0000 | INHALATION_SPRAY | Freq: Two times a day (BID) | RESPIRATORY_TRACT | Status: DC

## 2019-04-21 MED ORDER — DEXAMETHASONE SODIUM PHOSPHATE 10 MG/ML IJ SOLN
6.0000 mg | INTRAMUSCULAR | Status: DC
  Administered 2019-04-21 – 2019-04-28 (×8): 6 mg via INTRAVENOUS
  Filled 2019-04-21 (×8): qty 1

## 2019-04-21 MED ORDER — FUROSEMIDE 40 MG PO TABS
40.0000 mg | ORAL_TABLET | Freq: Two times a day (BID) | ORAL | Status: DC
  Filled 2019-04-21: qty 2

## 2019-04-21 MED ORDER — MOMETASONE FURO-FORMOTEROL FUM 200-5 MCG/ACT IN AERO
2.0000 | INHALATION_SPRAY | Freq: Two times a day (BID) | RESPIRATORY_TRACT | Status: DC
  Administered 2019-04-21 – 2019-04-28 (×14): 2 via RESPIRATORY_TRACT
  Filled 2019-04-21 (×2): qty 8.8

## 2019-04-21 MED ORDER — LEVOTHYROXINE SODIUM 75 MCG PO TABS
150.0000 ug | ORAL_TABLET | Freq: Every day | ORAL | Status: DC
  Administered 2019-04-21 – 2019-04-23 (×3): 150 ug via ORAL
  Filled 2019-04-21 (×3): qty 2
  Filled 2019-04-21: qty 1
  Filled 2019-04-21: qty 2

## 2019-04-21 MED ORDER — MONTELUKAST SODIUM 10 MG PO TABS
10.0000 mg | ORAL_TABLET | Freq: Every day | ORAL | Status: DC
  Administered 2019-04-21 – 2019-04-28 (×8): 10 mg via ORAL
  Filled 2019-04-21 (×8): qty 1

## 2019-04-21 MED ORDER — ALBUTEROL SULFATE HFA 108 (90 BASE) MCG/ACT IN AERS
2.0000 | INHALATION_SPRAY | Freq: Four times a day (QID) | RESPIRATORY_TRACT | Status: DC | PRN
  Administered 2019-04-21 – 2019-04-23 (×6): 2 via RESPIRATORY_TRACT
  Filled 2019-04-21: qty 6.7

## 2019-04-21 MED ORDER — VITAMIN C 500 MG PO TABS
500.0000 mg | ORAL_TABLET | Freq: Every day | ORAL | Status: DC
  Administered 2019-04-21 – 2019-04-28 (×8): 500 mg via ORAL
  Filled 2019-04-21 (×8): qty 1

## 2019-04-21 MED ORDER — NORTRIPTYLINE HCL 25 MG PO CAPS
75.0000 mg | ORAL_CAPSULE | Freq: Every day | ORAL | Status: DC
  Administered 2019-04-21 – 2019-04-27 (×7): 75 mg via ORAL
  Filled 2019-04-21 (×8): qty 3

## 2019-04-21 MED ORDER — PROPRANOLOL HCL 10 MG PO TABS
10.0000 mg | ORAL_TABLET | Freq: Two times a day (BID) | ORAL | Status: DC
  Administered 2019-04-21 – 2019-04-28 (×15): 10 mg via ORAL
  Filled 2019-04-21 (×17): qty 1

## 2019-04-21 MED ORDER — COLCHICINE 0.6 MG PO TABS
0.6000 mg | ORAL_TABLET | Freq: Two times a day (BID) | ORAL | Status: DC
  Administered 2019-04-21 – 2019-04-28 (×15): 0.6 mg via ORAL
  Filled 2019-04-21 (×17): qty 1

## 2019-04-21 MED ORDER — APIXABAN 5 MG PO TABS
5.0000 mg | ORAL_TABLET | Freq: Two times a day (BID) | ORAL | Status: DC
  Administered 2019-04-21 – 2019-04-28 (×15): 5 mg via ORAL
  Filled 2019-04-21 (×15): qty 1

## 2019-04-21 MED ORDER — ADULT MULTIVITAMIN W/MINERALS CH
1.0000 | ORAL_TABLET | Freq: Every day | ORAL | Status: DC
  Administered 2019-04-21 – 2019-04-28 (×8): 1 via ORAL
  Filled 2019-04-21 (×8): qty 1

## 2019-04-21 MED ORDER — GABAPENTIN 300 MG PO CAPS
300.0000 mg | ORAL_CAPSULE | Freq: Every day | ORAL | Status: DC
  Administered 2019-04-21 – 2019-04-27 (×7): 300 mg via ORAL
  Filled 2019-04-21 (×7): qty 1

## 2019-04-21 MED ORDER — TRAMADOL HCL 50 MG PO TABS
50.0000 mg | ORAL_TABLET | Freq: Four times a day (QID) | ORAL | Status: DC | PRN
  Administered 2019-04-22: 50 mg via ORAL
  Filled 2019-04-21: qty 1

## 2019-04-21 NOTE — Progress Notes (Signed)
PROGRESS NOTE    Wayne Booth  [id number] DOB: 09-07-1940 DOA: 04/20/2019 PCP: Marin Olp, MD    Brief Narrative:  78 year old who presented with dyspnea.  He does have significant past medical history of asthma, chronic hypoxic respiratory failure (2 L per nasal cannula), morbid obesity, pulmonary embolism, obstructive sleep apnea, atrial fibrillation, gout, hypertension, hypothyroidism, history of prostate and renal cancer, coronary artery disease.  Patient reported worsening dyspnea, increased oxygen requirements up to 4 L per nasal cannula and fever.  His wife tested positive for COVID-19 2 weeks ago.  On his initial physical examination his respiratory rate was 39, temperature 98.5, blood pressure 105/64, pulse rate 94, oxygen saturation 89% on 2 L per nasal cannula.  His mucous membranes were dry, he had decreased air movement bilaterally, expiratory wheezing and mild rhonchi, heart is also present rhythmic, abdomen soft, protuberant, no lower extremity edema. Sodium 130, potassium 4.9, chloride 94, bicarb 26, glucose 96, BUN 20, creatinine 1.75, AST 63, ALT 39, white count 6.6, hemoglobin 18.1, hematocrit 53.9, platelets 274.  SARS COVID-19 positive.  Urinalysis 21-50 red cells, 0-5 white cells.  Specific gravity 1.017.  His chest x-ray had bilateral interstitial infiltrates, more dense in the right upper lobe.  Patient was admitted to the hospital with working diagnosis of acute on chronic hypoxic respiratory failure due to SARS COVID-19 viral pneumonia.  Assessment & Plan:   Principal Problem:   Pneumonia due to COVID-19 virus Active Problems:   History of prostate cancer-seed implantation 2010.  Dr. Alinda Money   History of renal cell carcinoma   Morbid obesity ([place])   Essential hypertension   OSA (obstructive sleep apnea)   Hypothyroidism   CAD (coronary artery disease)   Hyponatremia   Acute on chronic respiratory failure with hypoxia and hypercapnia ([place])   1.  Acute hypoxic respiratory failure due to SARS COVID 19 viral pneumonia. Patient with persistent dyspnea and cough.   RR: 20  Fi02: 28 to 21% 2 LPM to room air.  Pulse oxymetry: 90 to 100%  Will continue medical therapy with Remdesivir #2/5. Continue systemic steroids with dexamethasone 6 mg Iv q 24, bronchodilator therapy ( albuterol and ipratropium), guaifenesin, zinc and vitamin C.  2. Asthma (mild intermittent). No signs of acute exacerbation, will continue with dulera and montelukast.   3. HTN. Continue blood pressure control with propranolol.  4. Depression and anxiety. Continue trazodone, nortriptyline, escitalopram and buspirone.   5. Hx of pulmonary embolism. Continue anticoagulation with apixaban,   6. Hypothyroid. Continue with levothyroxine.   7. Obesity. Calculated BMI is 38,5.   DVT prophylaxis: apixaban   Code Status:  full Family Communication: no family at the bedside  Disposition Plan/ discharge barriers: pending clinical improvement.   Body mass index is 38.35 kg/m. Malnutrition Type:      Malnutrition Characteristics:      Nutrition Interventions:     RN Pressure Injury Documentation:     Consultants:     Procedures:     Antimicrobials:       Subjective: Patient is feeling better, his dyspnea is improving but not yet back to baseline, no significant cough or wheezing, at home his asthma has been controlled.   Objective: Vitals:   04/21/19 0254 04/21/19 0300 04/21/19 0330 04/21/19 0556  BP: 118/83 126/82 (!) 111/94 119/86  Pulse: (!) 102 (!) 103 (!) 103 (!) 106  Resp: (!) 25 (!) 21 (!) 22 20  Temp:    98.4 F (36.9 C)  TempSrc:  Oral  SpO2: 90% 94% 95% 90%  Weight:    124.7 kg  Height:    5\' 11"  (1.803 m)    Intake/Output Summary (Last 24 hours) at 04/21/2019 0751 Last data filed at 04/21/2019 0600 Gross per 24 hour  Intake 886.37 ml  Output -  Net 886.37 ml   Filed Weights   04/21/19 0556  Weight: 124.7 kg     Examination:   General: Not in pain or dyspnea, deconditioned  Neurology: Awake and alert, non focal  E ENT: no pallor, no icterus, oral mucosa moist Cardiovascular: No JVD. S1-S2 present, rhythmic, no gallops, rubs, or murmurs. No lower extremity edema. Pulmonary:  positive breath sounds bilaterally. Gastrointestinal. Abdomen with no organomegaly, non tender, no rebound or guarding Skin. No rashes Musculoskeletal: no joint deformities     Data Reviewed: I have personally reviewed following labs and imaging studies  CBC: Recent Labs  Lab 04/20/19 1915  WBC 6.6  NEUTROABS 4.8  HGB 18.1*  HCT 53.9*  MCV 93.6  PLT 123456   Basic Metabolic Panel: Recent Labs  Lab 04/20/19 1601  NA 130*  K 4.9  CL 94*  CO2 26  GLUCOSE 96  BUN 20  CREATININE 1.75*  CALCIUM 8.2*  MG 2.1   GFR: Estimated Creatinine Clearance: 48.3 mL/min (A) (by C-G formula based on SCr of 1.75 mg/dL (H)). Liver Function Tests: Recent Labs  Lab 04/20/19 1601  AST 63*  ALT 39  ALKPHOS 78  BILITOT 1.9*  PROT 7.5  ALBUMIN 3.1*   No results for input(s): LIPASE, AMYLASE in the last 168 hours. No results for input(s): AMMONIA in the last 168 hours. Coagulation Profile: Recent Labs  Lab 04/20/19 1915  INR 1.3*   Cardiac Enzymes: No results for input(s): CKTOTAL, CKMB, CKMBINDEX, TROPONINI in the last 168 hours. BNP (last 3 results) Recent Labs    11/23/18 1103 01/24/19 1024 03/15/19 1209  PROBNP 23.0 27.0 31.0   HbA1C: No results for input(s): HGBA1C in the last 72 hours. CBG: No results for input(s): GLUCAP in the last 168 hours. Lipid Profile: No results for input(s): CHOL, HDL, LDLCALC, TRIG, CHOLHDL, LDLDIRECT in the last 72 hours. Thyroid Function Tests: No results for input(s): TSH, T4TOTAL, FREET4, T3FREE, THYROIDAB in the last 72 hours. Anemia Panel: No results for input(s): VITAMINB12, FOLATE, FERRITIN, TIBC, IRON, RETICCTPCT in the last 72 hours.    Radiology  Studies: I have reviewed all of the imaging during this hospital visit personally     Scheduled Meds: . apixaban  5 mg Oral BID  . colchicine  0.6 mg Oral BID  . escitalopram  20 mg Oral Daily  . furosemide  40-80 mg Oral BID  . gabapentin  300 mg Oral QHS  . Ipratropium-Albuterol  1 puff Inhalation Q6H  . levothyroxine  150 mcg Oral Q0600  . mometasone-formoterol  2 puff Inhalation BID  . montelukast  10 mg Oral Daily  . multivitamin with minerals  1 tablet Oral Daily  . nortriptyline  75 mg Oral QHS  . potassium chloride SA  20 mEq Oral BID  . propranolol  10 mg Oral BID   Continuous Infusions: . remdesivir 100 mg in NS 250 mL       LOS: 1 day        Yareliz Thorstenson Gerome Apley, MD

## 2019-04-21 NOTE — Discharge Instructions (Signed)

## 2019-04-21 NOTE — ED Notes (Signed)
[facility] at bedside 

## 2019-04-21 NOTE — Progress Notes (Addendum)
Spoke to patient's son and updated him on patient's status.  Attempted to call patient's spouse and son with no answer at either number.  Left a message at both numbers.  See chart for nsg assessment.

## 2019-04-22 ENCOUNTER — Inpatient Hospital Stay (HOSPITAL_COMMUNITY): Payer: Medicare Other

## 2019-04-22 DIAGNOSIS — I10 Essential (primary) hypertension: Secondary | ICD-10-CM | POA: Diagnosis not present

## 2019-04-22 DIAGNOSIS — G4733 Obstructive sleep apnea (adult) (pediatric): Secondary | ICD-10-CM

## 2019-04-22 DIAGNOSIS — Z8546 Personal history of malignant neoplasm of prostate: Secondary | ICD-10-CM

## 2019-04-22 DIAGNOSIS — I2510 Atherosclerotic heart disease of native coronary artery without angina pectoris: Secondary | ICD-10-CM | POA: Diagnosis not present

## 2019-04-22 DIAGNOSIS — E890 Postprocedural hypothyroidism: Secondary | ICD-10-CM

## 2019-04-22 DIAGNOSIS — J9621 Acute and chronic respiratory failure with hypoxia: Secondary | ICD-10-CM | POA: Diagnosis not present

## 2019-04-22 DIAGNOSIS — U071 COVID-19: Secondary | ICD-10-CM | POA: Diagnosis not present

## 2019-04-22 DIAGNOSIS — E875 Hyperkalemia: Secondary | ICD-10-CM

## 2019-04-22 LAB — COMPREHENSIVE METABOLIC PANEL
ALT: 28 U/L (ref 0–44)
AST: 41 U/L (ref 15–41)
Albumin: 2.8 g/dL — ABNORMAL LOW (ref 3.5–5.0)
Alkaline Phosphatase: 60 U/L (ref 38–126)
Anion gap: 9 (ref 5–15)
BUN: 32 mg/dL — ABNORMAL HIGH (ref 8–23)
CO2: 27 mmol/L (ref 22–32)
Calcium: 8.5 mg/dL — ABNORMAL LOW (ref 8.9–10.3)
Chloride: 99 mmol/L (ref 98–111)
Creatinine, Ser: 1.57 mg/dL — ABNORMAL HIGH (ref 0.61–1.24)
GFR calc Af Amer: 49 mL/min — ABNORMAL LOW (ref >60)
GFR calc non Af Amer: 42 mL/min — ABNORMAL LOW (ref >60)
Glucose, Bld: 134 mg/dL — ABNORMAL HIGH (ref 70–99)
Potassium: 5.5 mmol/L — ABNORMAL HIGH (ref 3.5–5.1)
Sodium: 135 mmol/L (ref 135–145)
Total Bilirubin: 1 mg/dL (ref 0.3–1.2)
Total Protein: 6.4 g/dL — ABNORMAL LOW (ref 6.5–8.1)

## 2019-04-22 LAB — FERRITIN: Ferritin: 412 ng/mL — ABNORMAL HIGH (ref 24–336)

## 2019-04-22 LAB — D-DIMER, QUANTITATIVE: D-Dimer, Quant: 1.42 ug/mL-FEU — ABNORMAL HIGH (ref 0.00–0.50)

## 2019-04-22 LAB — URINE CULTURE: Culture: NO GROWTH

## 2019-04-22 LAB — C-REACTIVE PROTEIN: CRP: 5.3 mg/dL — ABNORMAL HIGH (ref <1.0)

## 2019-04-22 MED ORDER — IPRATROPIUM-ALBUTEROL 0.5-2.5 (3) MG/3ML IN SOLN: 3.0000 mL | RESPIRATORY_TRACT | Status: DC | PRN

## 2019-04-22 MED ORDER — GUAIFENESIN-DM 100-10 MG/5ML PO SYRP
5.0000 mL | ORAL_SOLUTION | ORAL | Status: DC | PRN
  Administered 2019-04-22 – 2019-04-23 (×3): 5 mL via ORAL
  Filled 2019-04-22 (×3): qty 10

## 2019-04-22 MED ORDER — IPRATROPIUM-ALBUTEROL 20-100 MCG/ACT IN AERS
1.0000 | INHALATION_SPRAY | RESPIRATORY_TRACT | Status: DC | PRN
  Administered 2019-04-22 – 2019-04-23 (×4): 1 via RESPIRATORY_TRACT

## 2019-04-22 MED ORDER — LORAZEPAM 0.5 MG PO TABS
1.0000 mg | ORAL_TABLET | ORAL | Status: DC | PRN
  Administered 2019-04-22 – 2019-04-27 (×8): 1 mg via ORAL
  Filled 2019-04-22 (×8): qty 2

## 2019-04-22 MED ORDER — SODIUM ZIRCONIUM CYCLOSILICATE 5 G PO PACK
5.0000 g | PACK | Freq: Three times a day (TID) | ORAL | Status: AC
  Administered 2019-04-22 (×3): 5 g via ORAL
  Filled 2019-04-22 (×3): qty 1

## 2019-04-22 MED ORDER — HYDROCOD POLST-CPM POLST ER 10-8 MG/5ML PO SUER
5.0000 mL | Freq: Two times a day (BID) | ORAL | Status: DC
  Administered 2019-04-22 – 2019-04-28 (×13): 5 mL via ORAL
  Filled 2019-04-22 (×13): qty 5

## 2019-04-22 NOTE — Progress Notes (Addendum)
PROGRESS NOTE    Wayne Booth  [id number] DOB: 08-04-1940 DOA: 04/20/2019 PCP: Marin Olp, MD    Brief Narrative:  78 year old who presented with dyspnea.  He does have significant past medical history of asthma, chronic hypoxic respiratory failure (2 L per nasal cannula), morbid obesity, pulmonary embolism, obstructive sleep apnea, atrial fibrillation, gout, hypertension, hypothyroidism, history of prostate and renal cancer, coronary artery disease.  Patient reported worsening dyspnea, increased oxygen requirements up to 4 L per nasal cannula and fever.  His wife tested positive for COVID-19 2 weeks ago.  On his initial physical examination his respiratory rate was 39, temperature 98.5, blood pressure 105/64, pulse rate 94, oxygen saturation 89% on 2 L per nasal cannula.  His mucous membranes were dry, he had decreased air movement bilaterally, expiratory wheezing and mild rhonchi, heart is also present rhythmic, abdomen soft, protuberant, no lower extremity edema. Sodium 130, potassium 4.9, chloride 94, bicarb 26, glucose 96, BUN 20, creatinine 1.75, AST 63, ALT 39, white count 6.6, hemoglobin 18.1, hematocrit 53.9, platelets 274.  SARS COVID-19 positive.  Urinalysis 21-50 red cells, 0-5 white cells.  Specific gravity 1.017.  His chest x-ray had bilateral interstitial infiltrates, more dense in the right upper lobe.  Patient was admitted to the hospital with working diagnosis of acute on chronic hypoxic respiratory failure due to SARS COVID-19 viral pneumonia.  Patient is responding well to medical therapy, will plan to discharge home after completing 5 days of Remdesivir.   Assessment & Plan:   Principal Problem:   Pneumonia due to COVID-19 virus Active Problems:   History of prostate cancer-seed implantation 2010.  Dr. Alinda Money   History of renal cell carcinoma   Morbid obesity ([place])   Essential hypertension   OSA (obstructive sleep apnea)   Hypothyroidism   CAD  (coronary artery disease)   Hyponatremia   Acute on chronic respiratory failure with hypoxia and hypercapnia ([place])    1. Acute hypoxic respiratory failure due to SARS COVID 19 viral pneumonia. Continue to have dyspnea and cough, worse with minimal exertion.   RR: 16 to 18  Fi02: 28 % 2 LPM per [place] Pulse oxymetry: 92% to 96%  COVID-19 Labs  Recent Labs    04/22/19 0310  DDIMER 1.42*  FERRITIN 412*  CRP 5.3*    Tolerating well medical therapy with Remdesivir #3/5 (AST 41 and LAT 28).  Systemic steroids with dexamethasone 6 mg IV q 24 #3. Continue bronchodilator therapy ( albuterol and ipratropium), add chlorpheniramine-hydrocodone, continue guaifenesin/ DM, zinc and vitamin C. Airway cleaning techniques with incentive spirometer and flutter valve. Out of bed to chair tid with meals. Vitamin C and Zinc sulfate.    2. AKI on CKD stage 3a with NEW hyperkalemia. Old records personally reviewed, base cr 1,2. Renal function today with serum cr at 1.57 from 1,75, patient is tolerating po well. K up to 5,5 with serum bicarbonate at 27. Will add sodium zirconium tid x3 doses and follow on renal panel in am. Continue to hold on IV fluids for now. At home patient on furosemide 120 mg daily.   2. Asthma (mild intermittent). No wheezing on clinical examination. No signs of acute exacerbation. On dulera, albuterol, ipratropium and montelukast.   3. HTN. Blood pressure 127/67 with HR 84, will continue with propranolol, with good toleration.   4. Depression and anxiety. On trazodone, nortriptyline, escitalopram and buspirone. No confusion or agitation.   5. Hx of pulmonary embolism. On apixaban, for anticoagulation with good toleration.  6. Hypothyroid. On levothyroxine.   7. Obesity. BMI is 38,5. will need outpatient follow up.   DVT prophylaxis: apixaban   Code Status:  full Family Communication: I spoke over the phone with the patient's wife and son about patient's  condition, plan  of care and all questions were addressed.  Disposition Plan/ discharge barriers: pending clinical improvement.    Body mass index is 38.35 kg/m. Malnutrition Type:      Malnutrition Characteristics:      Nutrition Interventions:     RN Pressure Injury Documentation:     Consultants:     Procedures:     Antimicrobials:       Subjective: Patient continue to have dyspnea and cough, worse with minimal exertion, no nausea or vomiting, no chest pain. Out of bed to the chair, continue to have low appetite.   Objective: Vitals:   04/21/19 1529 04/21/19 1948 04/22/19 0352 04/22/19 0745  BP: 130/69 129/66 (!) 112/52 127/67  Pulse: (!) 101 100 83 87  Resp: 20 18 16    Temp: 98.2 F (36.8 C) 98.1 F (36.7 C) 97.9 F (36.6 C) 98.7 F (37.1 C)  TempSrc: Oral Oral Oral Oral  SpO2: 94% 96% 100% 92%  Weight:      Height:        Intake/Output Summary (Last 24 hours) at 04/22/2019 [id number] Last data filed at 04/22/2019 0400 Gross per 24 hour  Intake 1718.21 ml  Output -  Net 1718.21 ml   Filed Weights   04/21/19 0556  Weight: 124.7 kg    Examination:   General: Not in pain or dyspnea, deconditioned  Neurology: Awake and alert, non focal  E ENT: mild pallor, no icterus, oral mucosa moist Cardiovascular: No JVD. S1-S2 present, rhythmic, no gallops, rubs, or murmurs. No lower extremity edema. Pulmonary: positive breath sounds bilaterally, decreased air movement, no wheezing,or rhonchi. Positive rales at bases. . Gastrointestinal. Abdomen with no organomegaly, non tender, no rebound or guarding Skin. No rashes Musculoskeletal: no joint deformities     Data Reviewed: I have personally reviewed following labs and imaging studies  CBC: Recent Labs  Lab 04/20/19 1915  WBC 6.6  NEUTROABS 4.8  HGB 18.1*  HCT 53.9*  MCV 93.6  PLT 123456   Basic Metabolic Panel: Recent Labs  Lab 04/20/19 1601 04/22/19 0310  NA 130* 135  K 4.9 5.5*  CL 94* 99  CO2 26  27  GLUCOSE 96 134*  BUN 20 32*  CREATININE 1.75* 1.57*  CALCIUM 8.2* 8.5*  MG 2.1  --    GFR: Estimated Creatinine Clearance: 53.8 mL/min (A) (by C-G formula based on SCr of 1.57 mg/dL (H)). Liver Function Tests: Recent Labs  Lab 04/20/19 1601 04/22/19 0310  AST 63* 41  ALT 39 28  ALKPHOS 78 60  BILITOT 1.9* 1.0  PROT 7.5 6.4*  ALBUMIN 3.1* 2.8*   No results for input(s): LIPASE, AMYLASE in the last 168 hours. No results for input(s): AMMONIA in the last 168 hours. Coagulation Profile: Recent Labs  Lab 04/20/19 1915  INR 1.3*   Cardiac Enzymes: No results for input(s): CKTOTAL, CKMB, CKMBINDEX, TROPONINI in the last 168 hours. BNP (last 3 results) Recent Labs    11/23/18 1103 01/24/19 1024 03/15/19 1209  PROBNP 23.0 27.0 31.0   HbA1C: No results for input(s): HGBA1C in the last 72 hours. CBG: No results for input(s): GLUCAP in the last 168 hours. Lipid Profile: No results for input(s): CHOL, HDL, LDLCALC, TRIG, CHOLHDL, LDLDIRECT in the  last 72 hours. Thyroid Function Tests: No results for input(s): TSH, T4TOTAL, FREET4, T3FREE, THYROIDAB in the last 72 hours. Anemia Panel: Recent Labs    04/22/19 0310  FERRITIN 37*      Radiology Studies: I have reviewed all of the imaging during this hospital visit personally     Scheduled Meds: . apixaban  5 mg Oral BID  . colchicine  0.6 mg Oral BID  . dexamethasone (DECADRON) injection  6 mg Intravenous Q24H  . escitalopram  20 mg Oral Daily  . gabapentin  300 mg Oral QHS  . Ipratropium-Albuterol  1 puff Inhalation Q6H  . levothyroxine  150 mcg Oral Q0600  . mometasone-formoterol  2 puff Inhalation BID  . montelukast  10 mg Oral Daily  . multivitamin with minerals  1 tablet Oral Daily  . nortriptyline  75 mg Oral QHS  . propranolol  10 mg Oral BID  . vitamin C  500 mg Oral Daily  . zinc sulfate  220 mg Oral Daily   Continuous Infusions: . remdesivir 100 mg in NS 250 mL Stopped (04/21/19 1826)      LOS: 2 days        Mauricio Gerome Apley, MD

## 2019-04-23 DIAGNOSIS — I10 Essential (primary) hypertension: Secondary | ICD-10-CM | POA: Diagnosis not present

## 2019-04-23 DIAGNOSIS — I2510 Atherosclerotic heart disease of native coronary artery without angina pectoris: Secondary | ICD-10-CM | POA: Diagnosis not present

## 2019-04-23 DIAGNOSIS — U071 COVID-19: Secondary | ICD-10-CM | POA: Diagnosis not present

## 2019-04-23 DIAGNOSIS — J9621 Acute and chronic respiratory failure with hypoxia: Secondary | ICD-10-CM | POA: Diagnosis not present

## 2019-04-23 LAB — ABO/RH: ABO/RH(D): A POS

## 2019-04-23 LAB — COMPREHENSIVE METABOLIC PANEL
ALT: 27 U/L (ref 0–44)
AST: 42 U/L — ABNORMAL HIGH (ref 15–41)
Albumin: 2.7 g/dL — ABNORMAL LOW (ref 3.5–5.0)
Alkaline Phosphatase: 61 U/L (ref 38–126)
Anion gap: 10 (ref 5–15)
BUN: 29 mg/dL — ABNORMAL HIGH (ref 8–23)
CO2: 24 mmol/L (ref 22–32)
Calcium: 8.2 mg/dL — ABNORMAL LOW (ref 8.9–10.3)
Chloride: 100 mmol/L (ref 98–111)
Creatinine, Ser: 1.19 mg/dL (ref 0.61–1.24)
GFR calc Af Amer: >60 mL/min (ref >60)
GFR calc non Af Amer: 59 mL/min — ABNORMAL LOW (ref >60)
Glucose, Bld: 151 mg/dL — ABNORMAL HIGH (ref 70–99)
Potassium: 4.7 mmol/L (ref 3.5–5.1)
Sodium: 134 mmol/L — ABNORMAL LOW (ref 135–145)
Total Bilirubin: 1.2 mg/dL (ref 0.3–1.2)
Total Protein: 6.1 g/dL — ABNORMAL LOW (ref 6.5–8.1)

## 2019-04-23 LAB — C-REACTIVE PROTEIN: CRP: 3.1 mg/dL — ABNORMAL HIGH (ref <1.0)

## 2019-04-23 LAB — LACTATE DEHYDROGENASE: LDH: 311 U/L — ABNORMAL HIGH (ref 98–192)

## 2019-04-23 LAB — FERRITIN: Ferritin: 311 ng/mL (ref 24–336)

## 2019-04-23 LAB — D-DIMER, QUANTITATIVE: D-Dimer, Quant: 1.38 ug/mL-FEU — ABNORMAL HIGH (ref 0.00–0.50)

## 2019-04-23 MED ORDER — ONDANSETRON HCL 4 MG PO TABS: 4.0000 mg | ORAL_TABLET | Freq: Four times a day (QID) | ORAL | Status: DC | PRN

## 2019-04-23 MED ORDER — FUROSEMIDE 10 MG/ML IJ SOLN
40.0000 mg | Freq: Once | INTRAMUSCULAR | Status: AC
  Administered 2019-04-23: 40 mg via INTRAVENOUS
  Filled 2019-04-23: qty 4

## 2019-04-23 MED ORDER — ALBUTEROL SULFATE HFA 108 (90 BASE) MCG/ACT IN AERS
2.0000 | INHALATION_SPRAY | RESPIRATORY_TRACT | Status: DC | PRN
  Administered 2019-04-24 – 2019-04-27 (×3): 2 via RESPIRATORY_TRACT
  Filled 2019-04-23: qty 6.7

## 2019-04-23 MED ORDER — ACETAMINOPHEN 325 MG PO TABS: 650.0000 mg | ORAL_TABLET | Freq: Four times a day (QID) | ORAL | Status: DC | PRN

## 2019-04-23 MED ORDER — ALBUTEROL SULFATE (2.5 MG/3ML) 0.083% IN NEBU
2.5000 mg | INHALATION_SOLUTION | RESPIRATORY_TRACT | Status: DC | PRN
  Filled 2019-04-23: qty 3

## 2019-04-23 MED ORDER — ONDANSETRON HCL 4 MG/2ML IJ SOLN: 4.0000 mg | Freq: Four times a day (QID) | INTRAMUSCULAR | Status: DC | PRN

## 2019-04-23 NOTE — Progress Notes (Signed)
Pt had unwitnessed fall per pt statement. Dr. Ree Kida. Pt found sitting in chair, O2 off with labored breathing. Wayne Booth states that he was trying to use the restroom when he became weak and unable to causing him to have an urinary accident. Urinal was found on bedside table beside Wayne Booth. O2 placed back on pt via [place] and adequate O2 saturation was achieved. Pt currently sitting on the side of bed with nurse tech.Pt reeducated on the importance of calling for assistance. No other concerns at this time. Will continue to monitor.

## 2019-04-23 NOTE — Progress Notes (Signed)
PROGRESS NOTE    Wayne Booth  [id number] DOB: [birth year] DOA: 04/20/2019 PCP: Marin Olp, MD    Brief Narrative:  78 year old who presented with dyspnea. He does have significant past medical history of asthma, chronic hypoxic respiratory failure (2 L per nasal cannula),morbid obesity, pulmonary embolism, obstructive sleep apnea, atrial fibrillation,gout, hypertension, hypothyroidism, history of prostate and renal cancer, coronary artery disease. Patient reported worsening dyspnea, increased oxygen requirements up to 4 L per nasal cannula and fever. His wife tested positive for COVID-19 2 weeks ago.On his initial physical examination his respiratory rate was 39, temperature 98.5, blood pressure 105/64, pulse rate 94, oxygen saturation 89% on 2 L per nasal cannula. His mucous membranes were dry, he had decreased air movement bilaterally, expiratory wheezing and mild rhonchi, heart is also present rhythmic, abdomen soft, protuberant, no lower extremityedema. Sodium 130, potassium 4.9, chloride 94, bicarb 26, glucose 96, BUN 20, creatinine 1.75, AST 63, ALT 39, white count 6.6, hemoglobin 18.1, hematocrit 53.9, platelets 274.SARS COVID-19 positive. Urinalysis 21-50 red cells, 0-5 white cells. Specific gravity 1.017.His chest x-ray had bilateral interstitial infiltrates, more dense in the right upper lobe.  Patient was admitted to the hospital with working diagnosis of acute on chronic hypoxic respiratory failure due to SARS COVID-19 viral pneumonia.  Patient under medical therapy with steroids and Remdesivir, will plan to discharge home after completing 5 days of Remdesivir, if patient stable.   Developed pulmonary edema, likely related to core pulmonale, acute on chronic, started on furosemide IV, to keep negative fluid balance.   Assessment & Plan:   Principal Problem:   Pneumonia due to COVID-19 virus Active Problems:   History of prostate cancer-seed  implantation 2010.  Dr. Alinda Money   History of renal cell carcinoma   Morbid obesity ([place])   Essential hypertension   OSA (obstructive sleep apnea)   Hypothyroidism   CAD (coronary artery disease)   Hyponatremia   Acute on chronic respiratory failure with hypoxia and hypercapnia ([place])    1. Acute hypoxic respiratory failure due to SARS COVID 19 viral pneumonia. Patient had worsening dyspnea and increase oxygen requirements last night, chest film with bilateral interstitial infiltrates with fluid in the right fissure, consistent with signs of pulmonary edema.   RR: 20  Pulse oxymetry: 95% to 99% Fi02: 36% Fi02 with 4 LPM per [place] ( at home uses 2 LPM per [place]).  COVID-19 Labs  Recent Labs    04/22/19 0310 04/23/19 0411 04/23/19 0854  DDIMER 1.42* 1.38*  --   FERRITIN 412* 311  --   LDH  --   --  311*  CRP 5.3* 3.1*  --    Inflammatory makers are stable, with CRP less than 10. Will proceed with diuresis with furosemide 40 mg IV once and follow on urine output.   No clinical signs of worsening viral pneumonia, will continue with Remdesivir #4/5 (AST 42 and LAT 27).  Steroids with dexamethasone 6 mg IV q 24 #4. Continue with bronchodilator therapy ( albuterol and ipratropium),  chlorpheniramine-hydrocodone,  guaifenesin/ DM, zinc and vitamin C. Incentive spirometer and flutter valve.   2. 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 Worsening oxygenation and worsening interstitial infiltrates on chest film, will resume diuresis with furosemide 40 mg and will follow on urine output. Patient at home on furosemide po. Echocardiogram from 08/20 with preserved LV systolic function, EF 50 to 55%, technically difficult study.   3. AKI on CKD stage 3a with  NEW hyperkalemia. Renal function with serum Cr at 1,19 with K now down to 4,7, and serum bicarbonate at 24. Will continue close follow up of renal function and electrolytes.  Patient will have furosemide 40 mg IV x1 today, will plan to resume oral furosemide in am, depending on patient's response.   4. Asthma (mild intermittent). Continue with dulera, albuterol, ipratropium and montelukast. No signs of active flare.   5. HTN. Will continue propranolol, for blood pressure control with good toleration.   6. Depression and anxiety. Continue with trazodone, nortriptyline, escitalopram and buspirone.    7. Hx of pulmonary embolism. Continue with apixaban for anticoagulation.   8. Hypothyroid. Continue with levothyroxine.   9. Obesity. BMI calculated is 38,5.   DVT prophylaxis:apixaban Code Status:full Family Communication:no family at the bedside .  Disposition Plan/ discharge barriers:pending clinical improvement.  Body mass index is 38.35 kg/m. Malnutrition Type:      Malnutrition Characteristics:      Nutrition Interventions:     RN Pressure Injury Documentation:     Consultants:     Procedures:     Antimicrobials:       Subjective: Last night patient had worsening congestion and dyspnea, increased in his oxyen requirements up to 4 LPM per [place], this am he had a fall while trying to get out of bed with no head trauma or loss of consciousness. This am continue to have dyspnea.   Objective: Vitals:   04/22/19 0745 04/22/19 1549 04/22/19 1943 04/23/19 0735  BP: 127/67 127/74 126/69 128/86  Pulse: 87 70 98 (!) 108  Resp:  16 19   Temp: 98.7 F (37.1 C) 98 F (36.7 C) 98.2 F (36.8 C) 97.7 F (36.5 C)  TempSrc: Oral Oral Oral Oral  SpO2: 92% 95% 99% 95%  Weight:      Height:        Intake/Output Summary (Last 24 hours) at 04/23/2019 [id number] Last data filed at 04/23/2019 0600 Gross per 24 hour  Intake 580 ml  Output 1700 ml  Net -1120 ml   Filed Weights   04/21/19 0556  Weight: 124.7 kg    Examination:   General: positive dyspnea, deconditioned and ill looking appearing  Neurology: Awake and alert,  non focal  E ENT: mild pallor, no icterus, oral mucosa moist Cardiovascular: No JVD. S1-S2 present, rhythmic, no gallops, rubs, or murmurs. No lower extremity edema. Pulmonary: decreased breath sounds bilaterally with bilateral wet rales. Gastrointestinal. Abdomen protuberant with no organomegaly, non tender, no rebound or guarding Skin. No rashes Musculoskeletal: no joint deformities     Data Reviewed: I have personally reviewed following labs and imaging studies  CBC: Recent Labs  Lab 04/20/19 1915  WBC 6.6  NEUTROABS 4.8  HGB 18.1*  HCT 53.9*  MCV 93.6  PLT 123456   Basic Metabolic Panel: Recent Labs  Lab 04/20/19 1601 04/22/19 0310 04/23/19 0411  NA 130* 135 134*  K 4.9 5.5* 4.7  CL 94* 99 100  CO2 26 27 24   GLUCOSE 96 134* 151*  BUN 20 32* 29*  CREATININE 1.75* 1.57* 1.19  CALCIUM 8.2* 8.5* 8.2*  MG 2.1  --   --    GFR: Estimated Creatinine Clearance: 71 mL/min (by C-G formula based on SCr of 1.19 mg/dL). Liver Function Tests: Recent Labs  Lab 04/20/19 1601 04/22/19 0310 04/23/19 0411  AST 63* 41 42*  ALT 39 28 27  ALKPHOS 78 60 61  BILITOT 1.9* 1.0 1.2  PROT 7.5 6.4* 6.1*  ALBUMIN 3.1* 2.8* 2.7*   No results for input(s): LIPASE, AMYLASE in the last 168 hours. No results for input(s): AMMONIA in the last 168 hours. Coagulation Profile: Recent Labs  Lab 04/20/19 1915  INR 1.3*   Cardiac Enzymes: No results for input(s): CKTOTAL, CKMB, CKMBINDEX, TROPONINI in the last 168 hours. BNP (last 3 results) Recent Labs    11/23/18 1103 01/24/19 1024 03/15/19 1209  PROBNP 23.0 27.0 31.0   HbA1C: No results for input(s): HGBA1C in the last 72 hours. CBG: No results for input(s): GLUCAP in the last 168 hours. Lipid Profile: No results for input(s): CHOL, HDL, LDLCALC, TRIG, CHOLHDL, LDLDIRECT in the last 72 hours. Thyroid Function Tests: No results for input(s): TSH, T4TOTAL, FREET4, T3FREE, THYROIDAB in the last 72 hours. Anemia Panel: Recent  Labs    04/22/19 0310 04/23/19 0411  FERRITIN 37* 311      Radiology Studies: I have reviewed all of the imaging during this hospital visit personally     Scheduled Meds: . apixaban  5 mg Oral BID  . chlorpheniramine-HYDROcodone  5 mL Oral Q12H  . colchicine  0.6 mg Oral BID  . dexamethasone (DECADRON) injection  6 mg Intravenous Q24H  . escitalopram  20 mg Oral Daily  . gabapentin  300 mg Oral QHS  . Ipratropium-Albuterol  1 puff Inhalation Q6H  . levothyroxine  150 mcg Oral Q0600  . mometasone-formoterol  2 puff Inhalation BID  . montelukast  10 mg Oral Daily  . multivitamin with minerals  1 tablet Oral Daily  . nortriptyline  75 mg Oral QHS  . propranolol  10 mg Oral BID  . vitamin C  500 mg Oral Daily  . zinc sulfate  220 mg Oral Daily   Continuous Infusions: . remdesivir 100 mg in NS 250 mL 100 mg (04/22/19 1652)     LOS: 3 days        Mauricio Gerome Apley, MD

## 2019-04-23 NOTE — Progress Notes (Signed)
Pt experiencing increased expiratory wheezing and anxiety. PRN pulmonary meds given along with Buspar and IV Lasix. O2 in place via [place] at 2L sat 98%. Pt set up to clean hands and face for breakfast. Pt educated on the importance of keeping O2 in place in case of SOB upon exertion. Dr notified. No other concerns at this time. Will continue to monitor.

## 2019-04-24 ENCOUNTER — Telehealth: Payer: Self-pay | Admitting: Family Medicine

## 2019-04-24 ENCOUNTER — Ambulatory Visit: Payer: Medicare Other | Admitting: Physician Assistant

## 2019-04-24 DIAGNOSIS — U071 COVID-19: Secondary | ICD-10-CM | POA: Diagnosis not present

## 2019-04-24 DIAGNOSIS — J9621 Acute and chronic respiratory failure with hypoxia: Secondary | ICD-10-CM | POA: Diagnosis not present

## 2019-04-24 DIAGNOSIS — I2510 Atherosclerotic heart disease of native coronary artery without angina pectoris: Secondary | ICD-10-CM | POA: Diagnosis not present

## 2019-04-24 DIAGNOSIS — I10 Essential (primary) hypertension: Secondary | ICD-10-CM | POA: Diagnosis not present

## 2019-04-24 LAB — COMPREHENSIVE METABOLIC PANEL
ALT: 41 U/L (ref 0–44)
AST: 43 U/L — ABNORMAL HIGH (ref 15–41)
Albumin: 2.9 g/dL — ABNORMAL LOW (ref 3.5–5.0)
Alkaline Phosphatase: 71 U/L (ref 38–126)
Anion gap: 10 (ref 5–15)
BUN: 27 mg/dL — ABNORMAL HIGH (ref 8–23)
CO2: 29 mmol/L (ref 22–32)
Calcium: 8.3 mg/dL — ABNORMAL LOW (ref 8.9–10.3)
Chloride: 97 mmol/L — ABNORMAL LOW (ref 98–111)
Creatinine, Ser: 1.15 mg/dL (ref 0.61–1.24)
GFR calc Af Amer: >60 mL/min (ref >60)
GFR calc non Af Amer: >60 mL/min (ref >60)
Glucose, Bld: 108 mg/dL — ABNORMAL HIGH (ref 70–99)
Potassium: 4.5 mmol/L (ref 3.5–5.1)
Sodium: 136 mmol/L (ref 135–145)
Total Bilirubin: 0.3 mg/dL (ref 0.3–1.2)
Total Protein: 6.4 g/dL — ABNORMAL LOW (ref 6.5–8.1)

## 2019-04-24 LAB — CBC WITH DIFFERENTIAL/PLATELET
Abs Immature Granulocytes: 0.19 10*3/uL — ABNORMAL HIGH (ref 0.00–0.07)
Basophils Absolute: 0.1 10*3/uL (ref 0.0–0.1)
Basophils Relative: 1 %
Eosinophils Absolute: 0 10*3/uL (ref 0.0–0.5)
Eosinophils Relative: 0 %
HCT: 50 % (ref 39.0–52.0)
Hemoglobin: 16.3 g/dL (ref 13.0–17.0)
Immature Granulocytes: 2 %
Lymphocytes Relative: 10 %
Lymphs Abs: 1.3 10*3/uL (ref 0.7–4.0)
MCH: 31 pg (ref 26.0–34.0)
MCHC: 32.6 g/dL (ref 30.0–36.0)
MCV: 95.2 fL (ref 80.0–100.0)
Monocytes Absolute: 1.4 10*3/uL — ABNORMAL HIGH (ref 0.1–1.0)
Monocytes Relative: 11 %
Neutro Abs: 9.7 10*3/uL — ABNORMAL HIGH (ref 1.7–7.7)
Neutrophils Relative %: 76 %
Platelets: 359 10*3/uL (ref 150–400)
RBC: 5.25 MIL/uL (ref 4.22–5.81)
RDW: 14.6 % (ref 11.5–15.5)
WBC: 12.7 10*3/uL — ABNORMAL HIGH (ref 4.0–10.5)
nRBC: 0 % (ref 0.0–0.2)

## 2019-04-24 LAB — FERRITIN: Ferritin: 322 ng/mL (ref 24–336)

## 2019-04-24 LAB — C-REACTIVE PROTEIN: CRP: 2.6 mg/dL — ABNORMAL HIGH (ref <1.0)

## 2019-04-24 LAB — D-DIMER, QUANTITATIVE: D-Dimer, Quant: 1.27 ug/mL-FEU — ABNORMAL HIGH (ref 0.00–0.50)

## 2019-04-24 MED ORDER — FUROSEMIDE 20 MG PO TABS
40.0000 mg | ORAL_TABLET | Freq: Two times a day (BID) | ORAL | Status: DC
  Administered 2019-04-24 – 2019-04-28 (×9): 40 mg via ORAL
  Filled 2019-04-24 (×7): qty 2

## 2019-04-24 MED ORDER — DEXAMETHASONE SODIUM PHOSPHATE 10 MG/ML IJ SOLN: 6.0000 mg | INTRAMUSCULAR | Status: DC

## 2019-04-24 MED ORDER — LEVOTHYROXINE SODIUM 75 MCG PO TABS
150.0000 ug | ORAL_TABLET | Freq: Every day | ORAL | Status: DC
  Administered 2019-04-24 – 2019-04-28 (×5): 150 ug via ORAL
  Filled 2019-04-24 (×4): qty 2

## 2019-04-24 MED ORDER — ZINC SULFATE 220 (50 ZN) MG PO CAPS: 220.0000 mg | ORAL_CAPSULE | Freq: Every day | ORAL | Status: DC

## 2019-04-24 MED ORDER — VITAMIN C 500 MG PO TABS: 500.0000 mg | ORAL_TABLET | Freq: Every day | ORAL | Status: DC

## 2019-04-24 NOTE — Telephone Encounter (Signed)
See note

## 2019-04-24 NOTE — Telephone Encounter (Signed)
Spoke with patient's wife and encouraged her to advocate for herself and her husband-unwilling to talk to physician in the hospital if needed tomorrow.  Wife has COVID-19 and is suffering from ongoing fatigue-she would not be able to support patient in coming home at present without significant help.  Would need robust home health services if sent home.  Patient does have a son that lives in the home but he has cancer and would be high risk if contracted COVID-19-I have instructed him to quarantine from his mom and dad so he would not be able to support them either.

## 2019-04-24 NOTE — Telephone Encounter (Signed)
See below

## 2019-04-24 NOTE — Evaluation (Signed)
Physical Therapy Evaluation Patient Details Name: Wayne Booth MRN: [id number] DOB: April 04, 1941 Today's Date: 04/24/2019   History of Present Illness  78 year old who presented with dyspnea.  He does have significant past medical history of asthma, chronic hypoxic respiratory failure (2 L per nasal cannula), morbid obesity, pulmonary embolism, obstructive sleep apnea, atrial fibrillation, gout, hypertension, hypothyroidism, history of prostate and renal cancer, coronary artery disease.  Patient reported worsening dyspnea, increased oxygen requirements up to 4 L per nasal cannula and fever.  His wife tested positive for COVID-19 2 weeks ago, Patient positive for  COVID-19.  Clinical Impression  The patient received being very sleepy. Patient did  Ambulate x 25' with 1 HJHA on 2 L [place] with SPO2 95%. Patient should progress to DC home. Patient reports use of O2 at night PRN so has home O2. Pt admitted with above diagnosis.   Pt currently with functional limitations due to the deficits listed below (see PT Problem List). Pt will benefit from skilled PT to increase their independence and safety with mobility to allow discharge to the venue listed below.       Follow Up Recommendations No PT follow up    Equipment Recommendations  None recommended by PT    Recommendations for Other Services       Precautions / Restrictions Precautions Precaution Comments: ambulate  on RA. per door,      Mobility  Bed Mobility               General bed mobility comments: in recliner  Transfers Overall transfer level: Needs assistance Equipment used: None Transfers: Sit to/from Stand Sit to Stand: Supervision            Ambulation/Gait Ambulation/Gait assistance: Min assist Gait Distance (Feet): 80 Feet Assistive device: 1 person hand held assist Gait Pattern/deviations: Step-through pattern;Wide base of support;Drifts right/left     General Gait Details: at times, steady assist for   turning.  Stairs            Wheelchair Mobility    Modified Rankin (Stroke Patients Only)       Balance Overall balance assessment: Mild deficits observed, not formally tested                                           Pertinent Vitals/Pain Pain Assessment: No/denies pain    Home Living Family/patient expects to be discharged to:: Private residence Living Arrangements: Spouse/significant other Available Help at Discharge: Family;Available 24 hours/day Type of Home: House Home Access: Level entry     Home Layout: Able to live on main level with bedroom/bathroom Home Equipment: Kasandra Knudsen - single point      Prior Function Level of Independence: Independent               Hand Dominance        Extremity/Trunk Assessment   Upper Extremity Assessment Upper Extremity Assessment: Generalized weakness    Lower Extremity Assessment Lower Extremity Assessment: Generalized weakness    Cervical / Trunk Assessment Cervical / Trunk Assessment: Normal  Communication   Communication: No difficulties  Cognition Arousal/Alertness: Awake/alert(very sleepy initially) Behavior During Therapy: WFL for tasks assessed/performed Overall Cognitive Status: Within Functional Limits for tasks assessed  General Comments      Exercises     Assessment/Plan    PT Assessment Patient needs continued PT services  PT Problem List Decreased mobility;Cardiopulmonary status limiting activity;Decreased activity tolerance       PT Treatment Interventions Gait training;Functional mobility training;Therapeutic exercise;Patient/family education;Therapeutic activities    PT Goals (Current goals can be found in the Care Plan section)  Acute Rehab PT Goals Patient Stated Goal: go home soon PT Goal Formulation: With patient Time For Goal Achievement: 05/08/19 Potential to Achieve Goals: Good    Frequency Min  3X/week   Barriers to discharge        Co-evaluation               AM-PAC PT "6 Clicks" Mobility  Outcome Measure Help needed turning from your back to your side while in a flat bed without using bedrails?: A Little Help needed moving from lying on your back to sitting on the side of a flat bed without using bedrails?: A Little Help needed moving to and from a bed to a chair (including a wheelchair)?: A Little Help needed standing up from a chair using your arms (e.g., wheelchair or bedside chair)?: A Little Help needed to walk in hospital room?: A Little Help needed climbing 3-5 steps with a railing? : A Little 6 Click Score: 18    End of Session Equipment Utilized During Treatment: Oxygen Activity Tolerance: Patient tolerated treatment well Patient left: in chair;with call bell/phone within reach Nurse Communication: Mobility status PT Visit Diagnosis: Difficulty in walking, not elsewhere classified (R26.2);Unsteadiness on feet (R26.81)    Time: [id number] PT Time Calculation (min) (ACUTE ONLY): 16 min   Charges:   PT Evaluation $PT Eval Low Complexity: [facility] Acute Rehabilitation Services Pager [phone] Office [phone]   Claretha Cooper 04/24/2019, 4:54 PM

## 2019-04-24 NOTE — Progress Notes (Signed)
Attempted to call wife but left a voicemail.  See chart for nsg assessment.

## 2019-04-24 NOTE — Progress Notes (Addendum)
PROGRESS NOTE    Wayne Booth  [id number] DOB: 24-Nov-1940 DOA: 04/20/2019 PCP: Marin Olp, MD    Brief Narrative:  78 year old who presented with dyspnea. He does have significant past medical history of asthma, chronic hypoxic respiratory failure (2 L per nasal cannula),morbid obesity, pulmonary embolism, obstructive sleep apnea, atrial fibrillation,gout, hypertension, hypothyroidism, history of prostate and renal cancer, coronary artery disease. Patient reported worsening dyspnea, increased oxygen requirements up to 4 L per nasal cannula and fever. His wife tested positive for COVID-19 2 weeks ago.On his initial physical examination his respiratory rate was 39, temperature 98.5, blood pressure 105/64, pulse rate 94, oxygen saturation 89% on 2 L per nasal cannula. His mucous membranes were dry, he had decreased air movement bilaterally, expiratory wheezing and mild rhonchi, heart is also present rhythmic, abdomen soft, protuberant, no lower extremityedema. Sodium 130, potassium 4.9, chloride 94, bicarb 26, glucose 96, BUN 20, creatinine 1.75, AST 63, ALT 39, white count 6.6, hemoglobin 18.1, hematocrit 53.9, platelets 274.SARS COVID-19 positive. Urinalysis 21-50 red cells, 0-5 white cells. Specific gravity 1.017.His chest x-ray had bilateral interstitial infiltrates, more dense in the right upper lobe.  Patient was admitted to the hospital with working diagnosis of acute on chronic hypoxic respiratory failure due to SARS COVID-19 viral pneumonia.  Patient under medical therapy with steroids and Remdesivir, will plan to discharge home after completing 5 days of Remdesivir, if patient stable.   Developed pulmonary edema, likely related to core pulmonale, acute on chronic, started on furosemide IV, to keep negative fluid balance.   Patient responded well to diuresis with improvement of his respiratory symptoms. Continue to be very weak and deconditioned.     Assessment & Plan:   Principal Problem:   Pneumonia due to COVID-19 virus Active Problems:   History of prostate cancer-seed implantation 2010.  Dr. Alinda Money   History of renal cell carcinoma   Morbid obesity ([place])   Essential hypertension   OSA (obstructive sleep apnea)   Hypothyroidism   CAD (coronary artery disease)   Hyponatremia   Acute on chronic respiratory failure with hypoxia and hypercapnia ([place])   1. Acute hypoxic respiratory failure due to SARS COVID 19 viral pneumonia.Patient has responded well to diuresis with furosemide with improved dyspnea and oxygenation.   RR: 16.  Pulse oxymetry: 97% Fi02: 28% with 2 LPM per [place] (home 02 at 2 LPM)   COVID-19 Labs  Recent Labs    04/22/19 0310 04/23/19 0411 04/23/19 0854 04/24/19 0250  DDIMER 1.42* 1.38*  --  1.27*  FERRITIN 412* 311  --  322  LDH  --   --  311*  --   CRP 5.3* 3.1*  --  2.6*   \ Inflammatory makers are improving.  Continue with Remdesivir #5/5(AST 43 and LAT 41).Continue with sysrtemic seroids with dexamethasone 6 mg IVq 24#5. On bronchodilator therapy ( albuterol and ipratropium),antitussive with chlorpheniramine-hydrocodone, guaifenesin/ DM, vitamins with zinc and vitamin C.Airway clearing techniques with Incentive spirometer and flutter valve. Out of bed as tolerated.   2. 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  Echocardiogram from 08/20 with preserved LV systolic function, EF 50 to 55%, technically difficult study. Patient has responded well to diuresis, will continue oral furosemide per home regimen to keep negative fluid balance, 40 mg po bid.   3. 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 To resume today his oral  diuretic regimen with 40 mg furosemide bid, will follow with renal panel in am.  4. Asthma (mild intermittent).  No clinical signs of exacerbation with continue with dulera, albuterol, ipratropium andmontelukast.   5. HTN.Blood pressure 147/84 will continue with propranolol, for blood pressure control with good toleration.  6. Depression and anxiety.On trazodone, nortriptyline, escitalopram and buspirone. Asa needed lorazepam for anxiety.   7. Hx of pulmonary embolism.Tolerating wellapixaban for anticoagulation.   8. Hypothyroid.Onlevothyroxine.   9. Obesity. His BMI calculated is 38,5.  DVT prophylaxis:apixaban Code Status:full Family Communication:no family at the bedside .  Disposition Plan/ discharge barriers:pending clinical improvement.  Body mass index is 38.35 kg/m. Malnutrition Type:      Malnutrition Characteristics:      Nutrition Interventions:     RN Pressure Injury Documentation:     Consultants:     Procedures:     Antimicrobials:       Subjective: Patient is out of bed to chair this am, his dyspnea and cough have improved, but he continue to have severe weakness and deconditioned, with dyspnea with minimal efforts.   Objective: Vitals:   04/23/19 1915 04/23/19 1935 04/24/19 0504 04/24/19 0730  BP: (!) 147/75  122/87 (!) 147/84  Pulse: (!) 102 100 75 76  Resp: (!) 21  18 16   Temp: 98.9 F (37.2 C)  98.6 F (37 C) 98 F (36.7 C)  TempSrc: Oral  Oral Oral  SpO2: 100% 100% 100% 97%  Weight:      Height:       No intake or output data in the 24 hours ending 04/24/19 0809 Filed Weights   04/21/19 0556  Weight: 124.7 kg    Examination:   General: Not in pain or dyspnea, deconditioned  Neurology: Awake and alert, non focal  E ENT: mild pallor, no icterus, oral mucosa moist Cardiovascular: No JVD. S1-S2 present, rhythmic, no gallops, rubs, or murmurs. No lower extremity edema. Pulmonary: positive breath sounds bilaterally, decreased air movement. . Gastrointestinal. Abdomen with no organomegaly, non tender, no  rebound or guarding Skin. No rashes Musculoskeletal: no joint deformities     Data Reviewed: I have personally reviewed following labs and imaging studies  CBC: Recent Labs  Lab 04/20/19 1915 04/24/19 0250  WBC 6.6 12.7*  NEUTROABS 4.8 9.7*  HGB 18.1* 16.3  HCT 53.9* 50.0  MCV 93.6 95.2  PLT 274 [id number]   Basic Metabolic Panel: Recent Labs  Lab 04/20/19 1601 04/22/19 0310 04/23/19 0411 04/24/19 0250  NA 130* 135 134* 136  K 4.9 5.5* 4.7 4.5  CL 94* 99 100 97*  CO2 26 27 24 29   GLUCOSE 96 134* 151* 108*  BUN 20 32* 29* 27*  CREATININE 1.75* 1.57* 1.19 1.15  CALCIUM 8.2* 8.5* 8.2* 8.3*  MG 2.1  --   --   --    GFR: Estimated Creatinine Clearance: 73.5 mL/min (by C-G formula based on SCr of 1.15 mg/dL). Liver Function Tests: Recent Labs  Lab 04/20/19 1601 04/22/19 0310 04/23/19 0411 04/24/19 0250  AST 63* 41 42* 43*  ALT 39 28 27 41  ALKPHOS 78 60 61 71  BILITOT 1.9* 1.0 1.2 0.3  PROT 7.5 6.4* 6.1* 6.4*  ALBUMIN 3.1* 2.8* 2.7* 2.9*   No results for input(s): LIPASE, AMYLASE in the last 168 hours. No results for input(s): AMMONIA in the last 168 hours. Coagulation Profile: Recent Labs  Lab 04/20/19 1915  INR 1.3*   Cardiac Enzymes: No results for input(s): CKTOTAL, CKMB, CKMBINDEX, TROPONINI  in the last 168 hours. BNP (last 3 results) Recent Labs    11/23/18 1103 01/24/19 1024 03/15/19 1209  PROBNP 23.0 27.0 31.0   HbA1C: No results for input(s): HGBA1C in the last 72 hours. CBG: No results for input(s): GLUCAP in the last 168 hours. Lipid Profile: No results for input(s): CHOL, HDL, LDLCALC, TRIG, CHOLHDL, LDLDIRECT in the last 72 hours. Thyroid Function Tests: No results for input(s): TSH, T4TOTAL, FREET4, T3FREE, THYROIDAB in the last 72 hours. Anemia Panel: Recent Labs    04/22/19 0310 04/23/19 0411  FERRITIN 49* 311      Radiology Studies: I have reviewed all of the imaging during this hospital visit  personally     Scheduled Meds: . apixaban  5 mg Oral BID  . chlorpheniramine-HYDROcodone  5 mL Oral Q12H  . colchicine  0.6 mg Oral BID  . dexamethasone (DECADRON) injection  6 mg Intravenous Q24H  . escitalopram  20 mg Oral Daily  . gabapentin  300 mg Oral QHS  . Ipratropium-Albuterol  1 puff Inhalation Q6H  . levothyroxine  150 mcg Oral Q0600  . mometasone-formoterol  2 puff Inhalation BID  . montelukast  10 mg Oral Daily  . multivitamin with minerals  1 tablet Oral Daily  . nortriptyline  75 mg Oral QHS  . propranolol  10 mg Oral BID  . vitamin C  500 mg Oral Daily  . zinc sulfate  220 mg Oral Daily   Continuous Infusions: . remdesivir 100 mg in NS 250 mL 100 mg (04/23/19 1700)     LOS: 4 days        Mauricio Gerome Apley, MD

## 2019-04-24 NOTE — Telephone Encounter (Signed)
[facility] Pt wife said they will release him tomorrow and she is not able to take care of him. Pt is having trouble walking and needs physical therapy . Pt would like dr hunter to call [facility] see if [facility] can [facility] longer and do physical therapy. Wayne Booth has cancer their son and mrs major is taking care of Wayne Booth also

## 2019-04-25 ENCOUNTER — Inpatient Hospital Stay (HOSPITAL_COMMUNITY): Payer: Medicare Other

## 2019-04-25 DIAGNOSIS — I2510 Atherosclerotic heart disease of native coronary artery without angina pectoris: Secondary | ICD-10-CM | POA: Diagnosis not present

## 2019-04-25 DIAGNOSIS — J9621 Acute and chronic respiratory failure with hypoxia: Secondary | ICD-10-CM | POA: Diagnosis not present

## 2019-04-25 DIAGNOSIS — U071 COVID-19: Secondary | ICD-10-CM | POA: Diagnosis not present

## 2019-04-25 DIAGNOSIS — Z85528 Personal history of other malignant neoplasm of kidney: Secondary | ICD-10-CM | POA: Diagnosis not present

## 2019-04-25 LAB — CULTURE, BLOOD (ROUTINE X 2)
Culture: NO GROWTH
Culture: NO GROWTH
Special Requests: ADEQUATE

## 2019-04-25 LAB — COMPREHENSIVE METABOLIC PANEL
ALT: 48 U/L — ABNORMAL HIGH (ref 0–44)
AST: 40 U/L (ref 15–41)
Albumin: 2.8 g/dL — ABNORMAL LOW (ref 3.5–5.0)
Alkaline Phosphatase: 69 U/L (ref 38–126)
Anion gap: 8 (ref 5–15)
BUN: 27 mg/dL — ABNORMAL HIGH (ref 8–23)
CO2: 32 mmol/L (ref 22–32)
Calcium: 8.6 mg/dL — ABNORMAL LOW (ref 8.9–10.3)
Chloride: 98 mmol/L (ref 98–111)
Creatinine, Ser: 1.13 mg/dL (ref 0.61–1.24)
GFR calc Af Amer: >60 mL/min (ref >60)
GFR calc non Af Amer: >60 mL/min (ref >60)
Glucose, Bld: 104 mg/dL — ABNORMAL HIGH (ref 70–99)
Potassium: 4.4 mmol/L (ref 3.5–5.1)
Sodium: 138 mmol/L (ref 135–145)
Total Bilirubin: 0.5 mg/dL (ref 0.3–1.2)
Total Protein: 6.2 g/dL — ABNORMAL LOW (ref 6.5–8.1)

## 2019-04-25 LAB — D-DIMER, QUANTITATIVE: D-Dimer, Quant: 1.06 ug/mL-FEU — ABNORMAL HIGH (ref 0.00–0.50)

## 2019-04-25 LAB — FERRITIN: Ferritin: 311 ng/mL (ref 24–336)

## 2019-04-25 LAB — C-REACTIVE PROTEIN: CRP: 1.7 mg/dL — ABNORMAL HIGH (ref <1.0)

## 2019-04-25 NOTE — Telephone Encounter (Signed)
Just an FYI in case they call back.

## 2019-04-25 NOTE — Progress Notes (Signed)
PROGRESS NOTE    Wayne Booth  [id number] DOB: Aug 19, 1940 DOA: 04/20/2019 PCP: Marin Olp, MD    Brief Narrative:  78 year old who presented with dyspnea. He does have significant past medical history of asthma, chronic hypoxic respiratory failure (2 L per nasal cannula),morbid obesity, pulmonary embolism, obstructive sleep apnea, atrial fibrillation,gout, hypertension, hypothyroidism, history of prostate and renal cancer, coronary artery disease. Patient reported worsening dyspnea, increased oxygen requirements up to 4 L per nasal cannula and fever. His wife tested positive for COVID-19 2 weeks ago.On his initial physical examination his respiratory rate was 39, temperature 98.5, blood pressure 105/64, pulse rate 94, oxygen saturation 89% on 2 L per nasal cannula. His mucous membranes were dry, he had decreased air movement bilaterally, expiratory wheezing and mild rhonchi, heart is also present rhythmic, abdomen soft, protuberant, no lower extremityedema. Sodium 130, potassium 4.9, chloride 94, bicarb 26, glucose 96, BUN 20, creatinine 1.75, AST 63, ALT 39, white count 6.6, hemoglobin 18.1, hematocrit 53.9, platelets 274.SARS COVID-19 positive. Urinalysis 21-50 red cells, 0-5 white cells. Specific gravity 1.017.His chest x-ray had bilateral interstitial infiltrates, more dense in the right upper lobe.  Patient was admitted to the hospital with working diagnosis of acute on chronic hypoxic respiratory failure due to SARS COVID-19 viral pneumonia.  Patientundermedical therapy with steroids and Remdesivir.  Developed pulmonary edema, likely related to core pulmonale, acute on chronic, started on furosemide IV, to keep negative fluid balance.  Patient responded well to diuresis with improvement of his respiratory symptoms. Now he has completed Remdesivir but continue to be very weak and deconditioned.     Assessment & Plan:   Principal Problem:   Pneumonia  due to COVID-19 virus Active Problems:   History of prostate cancer-seed implantation 2010.  Dr. Alinda Money   History of renal cell carcinoma   Morbid obesity ([place])   Essential hypertension   OSA (obstructive sleep apnea)   Hypothyroidism   CAD (coronary artery disease)   Hyponatremia   Acute on chronic respiratory failure with hypoxia and hypercapnia ([place])   1. Acute hypoxic respiratory failure due to SARS COVID 19 viral pneumonia.Patient continue to feel very weak and deconditioned, his dyspnea and cough have improved but are not back to baseline. He uses 2 LPM per [place] at home per supplemental oxygen.   RR: 19 Pulse oxymetry: 100% Fi02: 32% with 3 LPM per   COVID-19 Labs  Recent Labs    04/23/19 0411 04/23/19 0854 04/24/19 0250 04/25/19 0435  DDIMER 1.38*  --  1.27* 1.06*  FERRITIN 311  --  322 311  LDH  --  311*  --   --   CRP 3.1*  --  2.6* 1.7*   His inflammatory markers are improving.   He has completed Remdesivir 10.26/20. Will continue with sysrtemic seroids with dexamethasone 6 mg IVq 24#6. Continue withbronchodilator therapy ( albuterol and ipratropium),chlorpheniramine-hydrocodone, guaifenesin/ DM, vitamins with zinc and vitamin C.Continue airway clearing techniques (Incentive spirometer and flutter valve).Continue to encourage out of bed as tolerated. Physical therapy.   2. 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  Echocardiogram from 08/20 with preserved LV systolic function, EF 50 to 55%, technically difficult study.Patient has received IV furosemide, now back on his regular dose of po furosemide, will continue to target a negative fluid balance.   3. 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 Continue oral  furosemide, will follow with renal panel in am.  4. Asthma (mild  intermittent). No current clinical signs of exacerbation. Patient on dulera, albuterol, ipratropium andmontelukast, with good toleration.   5. HTN.Continue withpropranolol, for blood pressure control.  6. Depression and anxiety.Continue with trazodone, nortriptyline, escitalopram and buspirone. Lorazepam as needed for anxiety.    7. Hx of pulmonary embolism.continue anticoagulation with apixaban.  8. Hypothyroid.Continue withlevothyroxine.   9. Obesity. BMIcalculatedis 38,5.  DVT prophylaxis:apixaban Code Status:full Family Communication:I spoke with his wife over the phone and all questions were addressed. She is requesting patient to go to SNF at discharge.   Disposition Plan/ discharge barriers: slowly recovering, may need few more days of hospitalization.    Body mass index is 38.35 kg/m. Malnutrition Type:      Malnutrition Characteristics:      Nutrition Interventions:     RN Pressure Injury Documentation:     Consultants:     Procedures:     Antimicrobials:       Subjective: Patient with improved dyspnea and cough but not yet back to baseline, continue to be very weak and deconditioned, has been out of bed to chair this am.   Objective: Vitals:   04/24/19 1535 04/24/19 1951 04/25/19 0407 04/25/19 0755  BP: 134/81 137/70 129/85 (!) 142/92  Pulse: 90 92 77 79  Resp: 16 16 18 19   Temp: 97.9 F (36.6 C) 97.8 F (36.6 C) 97.9 F (36.6 C) 97.7 F (36.5 C)  TempSrc: Oral Oral Oral Oral  SpO2: 99% 97% 100% 100%  Weight:      Height:        Intake/Output Summary (Last 24 hours) at 04/25/2019 0958 Last data filed at 04/25/2019 [id number] Gross per 24 hour  Intake 240 ml  Output 550 ml  Net -310 ml   Filed Weights   04/21/19 0556  Weight: 124.7 kg    Examination:   General: Not in pain or dyspnea, deconditioned  Neurology: Awake and alert, non focal  E ENT: no pallor, no icterus, oral mucosa moist Cardiovascular:  No JVD. S1-S2 present, rhythmic, no gallops, rubs, or murmurs. No lower extremity edema. Pulmonary: positive breath sounds bilaterally. Gastrointestinal. Abdomen with no organomegaly, non tender, no rebound or guarding Skin. No rashes Musculoskeletal: no joint deformities     Data Reviewed: I have personally reviewed following labs and imaging studies  CBC: Recent Labs  Lab 04/20/19 1915 04/24/19 0250  WBC 6.6 12.7*  NEUTROABS 4.8 9.7*  HGB 18.1* 16.3  HCT 53.9* 50.0  MCV 93.6 95.2  PLT 274 [id number]   Basic Metabolic Panel: Recent Labs  Lab 04/20/19 1601 04/22/19 0310 04/23/19 0411 04/24/19 0250 04/25/19 0435  NA 130* 135 134* 136 138  K 4.9 5.5* 4.7 4.5 4.4  CL 94* 99 100 97* 98  CO2 26 27 24 29  32  GLUCOSE 96 134* 151* 108* 104*  BUN 20 32* 29* 27* 27*  CREATININE 1.75* 1.57* 1.19 1.15 1.13  CALCIUM 8.2* 8.5* 8.2* 8.3* 8.6*  MG 2.1  --   --   --   --    GFR: Estimated Creatinine Clearance: 74.8 mL/min (by C-G formula based on SCr of 1.13 mg/dL). Liver Function Tests: Recent Labs  Lab 04/20/19 1601 04/22/19 0310 04/23/19 0411 04/24/19 0250 04/25/19 0435  AST 63* 41 42* 43* 40  ALT 39 28 27 41 48*  ALKPHOS 78 60 61 71 69  BILITOT 1.9* 1.0 1.2 0.3 0.5  PROT 7.5 6.4* 6.1* 6.4* 6.2*  ALBUMIN 3.1* 2.8* 2.7* 2.9* 2.8*   No results for input(s): LIPASE, AMYLASE in the last 168 hours. No results for input(s): AMMONIA in the last 168 hours. Coagulation Profile: Recent Labs  Lab 04/20/19 1915  INR 1.3*   Cardiac Enzymes: No results for input(s): CKTOTAL, CKMB, CKMBINDEX, TROPONINI in the last 168 hours. BNP (last 3 results) Recent Labs    11/23/18 1103 01/24/19 1024 03/15/19 1209  PROBNP 23.0 27.0 31.0   HbA1C: No results for input(s): HGBA1C in the last 72 hours. CBG: No results for input(s): GLUCAP in the last 168 hours. Lipid Profile: No results for input(s): CHOL, HDL, LDLCALC, TRIG, CHOLHDL, LDLDIRECT in the last 72 hours. Thyroid Function  Tests: No results for input(s): TSH, T4TOTAL, FREET4, T3FREE, THYROIDAB in the last 72 hours. Anemia Panel: Recent Labs    04/24/19 0250 04/25/19 0435  FERRITIN 322 311      Radiology Studies: I have reviewed all of the imaging during this hospital visit personally     Scheduled Meds: . apixaban  5 mg Oral BID  . chlorpheniramine-HYDROcodone  5 mL Oral Q12H  . colchicine  0.6 mg Oral BID  . dexamethasone (DECADRON) injection  6 mg Intravenous Q24H  . escitalopram  20 mg Oral Daily  . furosemide  40 mg Oral BID  . gabapentin  300 mg Oral QHS  . Ipratropium-Albuterol  1 puff Inhalation Q6H  . levothyroxine  150 mcg Oral Q0600  . mometasone-formoterol  2 puff Inhalation BID  . montelukast  10 mg Oral Daily  . multivitamin with minerals  1 tablet Oral Daily  . nortriptyline  75 mg Oral QHS  . propranolol  10 mg Oral BID  . vitamin C  500 mg Oral Daily  . zinc sulfate  220 mg Oral Daily   Continuous Infusions:   LOS: 5 days        Wayne Arizola Gerome Apley, MD

## 2019-04-25 NOTE — Telephone Encounter (Signed)
Do I need to do anything further on this?

## 2019-04-26 DIAGNOSIS — U071 COVID-19: Secondary | ICD-10-CM | POA: Diagnosis not present

## 2019-04-26 DIAGNOSIS — J9621 Acute and chronic respiratory failure with hypoxia: Secondary | ICD-10-CM | POA: Diagnosis not present

## 2019-04-26 DIAGNOSIS — I10 Essential (primary) hypertension: Secondary | ICD-10-CM | POA: Diagnosis not present

## 2019-04-26 DIAGNOSIS — I2510 Atherosclerotic heart disease of native coronary artery without angina pectoris: Secondary | ICD-10-CM | POA: Diagnosis not present

## 2019-04-26 LAB — COMPREHENSIVE METABOLIC PANEL
ALT: 57 U/L — ABNORMAL HIGH (ref 0–44)
AST: 52 U/L — ABNORMAL HIGH (ref 15–41)
Albumin: 2.8 g/dL — ABNORMAL LOW (ref 3.5–5.0)
Alkaline Phosphatase: 68 U/L (ref 38–126)
Anion gap: 11 (ref 5–15)
BUN: 32 mg/dL — ABNORMAL HIGH (ref 8–23)
CO2: 31 mmol/L (ref 22–32)
Calcium: 8.6 mg/dL — ABNORMAL LOW (ref 8.9–10.3)
Chloride: 95 mmol/L — ABNORMAL LOW (ref 98–111)
Creatinine, Ser: 1.12 mg/dL (ref 0.61–1.24)
GFR calc Af Amer: >60 mL/min (ref >60)
GFR calc non Af Amer: >60 mL/min (ref >60)
Glucose, Bld: 119 mg/dL — ABNORMAL HIGH (ref 70–99)
Potassium: 4.3 mmol/L (ref 3.5–5.1)
Sodium: 137 mmol/L (ref 135–145)
Total Bilirubin: 0.4 mg/dL (ref 0.3–1.2)
Total Protein: 6.4 g/dL — ABNORMAL LOW (ref 6.5–8.1)

## 2019-04-26 LAB — C-REACTIVE PROTEIN: CRP: 1.4 mg/dL — ABNORMAL HIGH (ref <1.0)

## 2019-04-26 LAB — D-DIMER, QUANTITATIVE: D-Dimer, Quant: 0.94 ug/mL-FEU — ABNORMAL HIGH (ref 0.00–0.50)

## 2019-04-26 LAB — FERRITIN: Ferritin: 312 ng/mL (ref 24–336)

## 2019-04-26 NOTE — Progress Notes (Signed)
Occupational Therapy Evaluation Patient Details Name: Wayne Booth MRN: [id number] DOB: Jun 19, 1941 Today's Date: 04/26/2019    History of Present Illness 78 year old who presented with dyspnea.  He does have significant past medical history of asthma, chronic hypoxic respiratory failure (2 L per nasal cannula), morbid obesity, pulmonary embolism, obstructive sleep apnea, atrial fibrillation, gout, hypertension, hypothyroidism, history of prostate and renal cancer, coronary artery disease.  Patient reported worsening dyspnea, increased oxygen requirements up to 4 L per nasal cannula and fever.  His wife tested positive for COVID-19 2 weeks ago, Patient positive for  COVID-19.   Clinical Impression   PTA, pt independent with ADL and mobility. Able to ambulate to bathroom, complete toilet transfer and hygiene after toileting on RA with SpO2 91 on RA @ RW level. AFter return to recliner, desat to 88 but quick rebound to 90s with pursed lip breathing and use of flutter valve. Increased time regarding education on importance of breathing exercises and increasing mobility, as pt states he has not been using his flutter valve or incentive spirometer. Pt states he is not interested in rehab at [facility] and wants to DC home with [facility] services. Discussed with MD. Will follow acutely to facilitate safe DC home.     Follow Up Recommendations  Home health OT;Supervision - Intermittent    Equipment Recommendations  None recommended by OT    Recommendations for Other Services       Precautions / Restrictions Precautions Precautions: Fall Precaution Comments: watch O2 sats      Mobility Bed Mobility               General bed mobility comments: OOB in recliner  Transfers Overall transfer level: Needs assistance Equipment used: Rolling walker (2 wheeled) Transfers: Sit to/from Stand Sit to Stand: Supervision              Balance Overall balance assessment: Mild deficits observed, not  formally tested                                         ADL either performed or assessed with clinical judgement   ADL Overall ADL's : Needs assistance/impaired Eating/Feeding: Set up   Grooming: Set up;Standing;Supervision/safety   Upper Body Bathing: Set up;Sitting   Lower Body Bathing: Minimal assistance   Upper Body Dressing : Set up;Sitting   Lower Body Dressing: Minimal assistance;Sit to/from stand Lower Body Dressing Details (indicate cue type and reason): uses sock aid at home Toilet Transfer: Min guard;Ambulation;RW           Functional mobility during ADLs: Min guard;Cueing for safety;Rolling walker General ADL Comments: Easily fatigues with ADL. Ambulated to bathroom on RA with SpO2 in low 90s. After returning to chair, SpO2 88 with increased WOB. Educated pt on pursed lip breathing - pt begins to do a "panting" breathing pattern. Use of flutter valve helps to slow RR.      Vision         Perception     Praxis      Pertinent Vitals/Pain Pain Assessment: Faces Faces Pain Scale: Hurts little more Pain Location: chest Pain Descriptors / Indicators: Burning Pain Intervention(s): Limited activity within patient's tolerance;Other (comment)(MD notified)     Hand Dominance Right   Extremity/Trunk Assessment Upper Extremity Assessment Upper Extremity Assessment: Generalized weakness   Lower Extremity Assessment Lower Extremity Assessment: Defer to PT evaluation   Cervical /  Trunk Assessment Cervical / Trunk Assessment: Normal   Communication     Cognition Arousal/Alertness: Awake/alert Behavior During Therapy: WFL for tasks assessed/performed Overall Cognitive Status: Within Functional Limits for tasks assessed(although slow processing)                                     General Comments       Exercises Exercises: Other exercises;Shoulder;General Upper Extremity General Exercises - Upper Extremity Shoulder  Flexion: Strengthening;Both;10 reps;Theraband Theraband Level (Shoulder Flexion): Level 2 (Red) Shoulder Extension: Strengthening;Both;10 reps;Seated;Theraband Theraband Level (Shoulder Extension): Level 2 (Red) Elbow Flexion: Strengthening;Both;10 reps;Seated;Theraband Theraband Level (Elbow Flexion): Level 2 (Red) Elbow Extension: Strengthening;Both;10 reps;Seated;Theraband Theraband Level (Elbow Extension): Level 2 (Red) Other Exercises Other Exercises: incentive spirometer x 10 - able to pull 500 ml; vc for correct use and to hold breath at end inspiroation Other Exercises: flutter valve x 10   Shoulder Instructions      Home Living Family/patient expects to be discharged to:: Private residence Living Arrangements: Spouse/significant other Available Help at Discharge: Family;Available 24 hours/day Type of Home: House Home Access: Level entry     Home Layout: Able to live on main level with bedroom/bathroom     Bathroom Shower/Tub: Occupational psychologist: Standard Bathroom Accessibility: Yes How Accessible: Accessible via walker Home Equipment: [place] - single point          Prior Functioning/Environment Level of Independence: Independent                 OT Problem List: Decreased strength;Decreased activity tolerance;Decreased safety awareness;Decreased knowledge of use of DME or AE;Cardiopulmonary status limiting activity;Obesity;Pain      OT Treatment/Interventions: Self-care/ADL training;Therapeutic exercise;Neuromuscular education;Energy conservation;DME and/or AE instruction;Therapeutic activities;Patient/family education    OT Goals(Current goals can be found in the care plan section) Acute Rehab OT Goals Patient Stated Goal: go home soon OT Goal Formulation: With patient Time For Goal Achievement: 05/10/19 Potential to Achieve Goals: Good  OT Frequency: Min 3X/week   Barriers to D/C: Other (comment)(wife is also sick with Covid; son has  cancer and is limited)          Co-evaluation              AM-PAC OT "6 Clicks" Daily Activity     Outcome Measure Help from another person eating meals?: None Help from another person taking care of personal grooming?: A Little Help from another person toileting, which includes using toliet, bedpan, or urinal?: A Little Help from another person bathing (including washing, rinsing, drying)?: A Little Help from another person to put on and taking off regular upper body clothing?: A Little Help from another person to put on and taking off regular lower body clothing?: A Little 6 Click Score: 19   End of Session Equipment Utilized During Treatment: Rolling walker Nurse Communication: Mobility status;Other (comment)(need to encourage use of IS and Flutter; increase ambulation)  Activity Tolerance: Patient tolerated treatment well Patient left: in chair;with call bell/phone within reach  OT Visit Diagnosis: Unsteadiness on feet (R26.81);Muscle weakness (generalized) (M62.81);Pain Pain - part of body: (chest)                Time: [id number] OT Time Calculation (min): 39 min Charges:  OT General Charges $OT Visit: 1 Visit OT Evaluation $OT Eval Moderate Complexity: 1 Mod OT Treatments $Self Care/Home Management : 8-22 mins $Therapeutic Exercise: 8-22 mins  Deidre Ala  Nefertiti Mohamad, OT/L   Acute OT Clinical Specialist Acute Rehabilitation Services Pager [phone] Office [phone]   [facility] 04/26/2019, 2:07 PM

## 2019-04-26 NOTE — Progress Notes (Signed)
PROGRESS NOTE  THADDUS GIKAS  [id number] DOB: 06-11-1941 DOA: 04/20/2019 PCP: Marin Olp, MD   Brief Narrative: FOUNT FUGERE is a 78 y.o. male with a history of intermittent 2L O2-dependence, hx PE, OSA, asthma, AFib, CAD, HTN, hypothyroidism, history of prostate CA and RCC whose wife recently tested positive for covid-19. He presented with dyspnea requiring 4L O2 and was confirmed to have positive SARS-CoV-2. CXR demonstrated bilateral infiltrates. Remdesivir and steroids were started, though he later developed acute pulmonary edema due to acute on chronic cor pulmonale and was given IV lasix with improvement. He remains deconditioned and hypoxic, albeit improved.   Assessment & Plan: Principal Problem:   Pneumonia due to COVID-19 virus Active Problems:   History of prostate cancer-seed implantation 2010.  Dr. Alinda Money   History of renal cell carcinoma   Morbid obesity ([place])   Essential hypertension   OSA (obstructive sleep apnea)   Hypothyroidism   CAD (coronary artery disease)   Hyponatremia   Acute on chronic respiratory failure with hypoxia and hypercapnia (HCC)  Acute on chronic hypoxic respiratory failure due to covid-19 pneumonia, moderate persistent asthma, OSA, history of PE: The multitude and severity of comorbidities make this patient high risk for decompensation and poor outcome. Last year he required life support in the setting of pneumonia. Note RV function and pulmonary artery size by echo of limited quality were grossly normal - Continue supplemental oxygen as needed to maintain SpO2 >85% and respiratory effort normal.  - Has completed remdesivir (10/22 - 10/26) - Continue steroids x10 days (10/22 - 10/31) - Airborne/contact precautions - Encourage proning, incentive spirometry, OOB as much as possible.  - Antitussives - Vitamin C, zinc - Inhalers scheduled and prn, singulair. No wheezing to suggest asthma exacerbation. - Continue apixaban  - Cannot  give CPAP/BiPAP in setting of covid-19, but can use HFNC/HHF qHS if needed.   Deconditioning: Due to covid-19 infection.  - Continue PT and OT at home when discharged.   Acute on chronic HFpEF:  - Keep negative balance, no longer volume overloaded and hypoxia improved.  - Continue home lasix 40mg  po BID - Monitor I/O, daily weights  AKI on stage IIIa CKD: Has stabilized.  - Monitor metabolic panel - Avoid nephrotoxins  HTN:  - Continue home propranolol (also for ET)  Paroxysmal atrial fibrillation s/p ablation 2019: NSR on ECG - Propranolol, eliquis as above.   Morbid obesity: Noted  Hypothyroidism: Last TSH 0.45 in Dec 2019. - Continue synthroid   Depression, anxiety:  - Continue home medications including SSRI, nortriptyline, buspar, trazodone, and prn lorazepam.   DVT prophylaxis: Eliquis Code Status: Full Family Communication: Wife by phone and Son by phone independently. Disposition Plan: Long discussion regarding safest discharge venue, including patient, wife, therapy and hospitalist. Will plan on discharge to home in next 24-48 hours.  Consultants:   None  Procedures:   None  Antimicrobials:  Remdesivir 10/22 - 10/26   Subjective: Less coughing than at admission, otherwise states he feels about the same. Specifically, he remains severely constantly weak limiting ambulation. Takes a long time to get to bathroom (~15 feet) and back, wears him out for hours as well. No chest pain, leg swelling improved. Shortness of breath only modestly improved from admission. States he's reluctant to return home but when a SNF discharge was described in detail, he refused to go there either.  Objective: Vitals:   04/25/19 1621 04/25/19 1947 04/26/19 0341 04/26/19 0806  BP: 135/85 125/66 108/68 117/70  Pulse: 83 85 73 77  Resp: 20 18 16    Temp: 97.6 F (36.4 C) 98.2 F (36.8 C) 98.4 F (36.9 C) (!) 97.4 F (36.3 C)  TempSrc: Oral Oral Oral Oral  SpO2: 97% 96% 99% 100%   Weight:      Height:        Intake/Output Summary (Last 24 hours) at 04/26/2019 1458 Last data filed at 04/25/2019 2100 Gross per 24 hour  Intake 480 ml  Output 250 ml  Net 230 ml   Filed Weights   04/21/19 0556  Weight: 124.7 kg    Gen: Obese male in no distress Pulm: Non-labored breathing 2L O2 at rest, RR ~18/min. Crackles bilaterally with good aeration.  CV: Regular rate and rhythm. No murmur, rub, or gallop. No JVD, no pitting dependent edema. GI: Abdomen protuberant, non-tender, non-distended, with normoactive bowel sounds. No organomegaly or masses felt. Ext: Warm, no deformities Skin: No rashes, lesions or ulcers Neuro: Alert and oriented. No focal neurological deficits. Psych: Judgement and insight appear normal. Mood & affect appropriate.   Data Reviewed: I have personally reviewed following labs and imaging studies  CBC: Recent Labs  Lab 04/20/19 1915 04/24/19 0250  WBC 6.6 12.7*  NEUTROABS 4.8 9.7*  HGB 18.1* 16.3  HCT 53.9* 50.0  MCV 93.6 95.2  PLT 274 [id number]   Basic Metabolic Panel: Recent Labs  Lab 04/20/19 1601 04/22/19 0310 04/23/19 0411 04/24/19 0250 04/25/19 0435 04/26/19 0430  NA 130* 135 134* 136 138 137  K 4.9 5.5* 4.7 4.5 4.4 4.3  CL 94* 99 100 97* 98 95*  CO2 26 27 24 29  32 31  GLUCOSE 96 134* 151* 108* 104* 119*  BUN 20 32* 29* 27* 27* 32*  CREATININE 1.75* 1.57* 1.19 1.15 1.13 1.12  CALCIUM 8.2* 8.5* 8.2* 8.3* 8.6* 8.6*  MG 2.1  --   --   --   --   --    GFR: Estimated Creatinine Clearance: 75.5 mL/min (by C-G formula based on SCr of 1.12 mg/dL). Liver Function Tests: Recent Labs  Lab 04/22/19 0310 04/23/19 0411 04/24/19 0250 04/25/19 0435 04/26/19 0430  AST 41 42* 43* 40 52*  ALT 28 27 41 48* 57*  ALKPHOS 60 61 71 69 68  BILITOT 1.0 1.2 0.3 0.5 0.4  PROT 6.4* 6.1* 6.4* 6.2* 6.4*  ALBUMIN 2.8* 2.7* 2.9* 2.8* 2.8*   No results for input(s): LIPASE, AMYLASE in the last 168 hours. No results for input(s): AMMONIA in the  last 168 hours. Coagulation Profile: Recent Labs  Lab 04/20/19 1915  INR 1.3*   Cardiac Enzymes: No results for input(s): CKTOTAL, CKMB, CKMBINDEX, TROPONINI in the last 168 hours. BNP (last 3 results) Recent Labs    11/23/18 1103 01/24/19 1024 03/15/19 1209  PROBNP 23.0 27.0 31.0   HbA1C: No results for input(s): HGBA1C in the last 72 hours. CBG: No results for input(s): GLUCAP in the last 168 hours. Lipid Profile: No results for input(s): CHOL, HDL, LDLCALC, TRIG, CHOLHDL, LDLDIRECT in the last 72 hours. Thyroid Function Tests: No results for input(s): TSH, T4TOTAL, FREET4, T3FREE, THYROIDAB in the last 72 hours. Anemia Panel: Recent Labs    04/25/19 0435 04/26/19 0430  FERRITIN 311 312   Urine analysis:    Component Value Date/Time   COLORURINE AMBER (A) 04/20/2019 1438   APPEARANCEUR CLEAR 04/20/2019 1438   LABSPEC 1.017 04/20/2019 1438   PHURINE 5.0 04/20/2019 1438   GLUCOSEU NEGATIVE 04/20/2019 1438   HGBUR MODERATE (A) 04/20/2019  1438   HGBUR 2+ 09/22/2007 [place] 04/20/2019 1438   BILIRUBINUR n 11/07/2015 [place] 04/20/2019 [place] 04/20/2019 1438   UROBILINOGEN 1.0 11/07/2015 1417   UROBILINOGEN 1.0 05/22/2011 1454   NITRITE NEGATIVE 04/20/2019 1438   LEUKOCYTESUR NEGATIVE 04/20/2019 1438   Recent Results (from the past 240 hour(s))  Urine culture     Status: None   Collection Time: 04/20/19  2:38 PM   Specimen: In/Out Cath Urine  Result Value Ref Range Status   Specimen Description   Final    IN/OUT CATH URINE Performed at [facility], [place] [address] [place], [place] [address]    Special Requests   Final    NONE Performed at [facility], [place] [address] [place], [place] [address]    Culture   Final    NO GROWTH Performed at [facility], [place] [address], [place], [place] [address]    Report Status 04/22/2019 FINAL  Final  Blood culture  (routine x 2)     Status: None   Collection Time: 04/20/19  3:56 PM   Specimen: BLOOD  Result Value Ref Range Status   Specimen Description   Final    BLOOD RIGHT ANTECUBITAL Performed at [place] [address] [place], [place] [address]    Special Requests   Final    BOTTLES DRAWN AEROBIC AND ANAEROBIC Blood Culture results may not be optimal due to an inadequate volume of blood received in culture bottles Performed at [place] [address], [place], [place] [address]    Culture   Final    NO GROWTH 5 DAYS Performed at [facility], [place] [address] [place], [place] [address]    Report Status 04/25/2019 FINAL  Final  SARS Coronavirus 2 by RT PCR (hospital order, performed in [facility] hospital lab) Nasopharyngeal Nasopharyngeal Swab     Status: Abnormal   Collection Time: 04/20/19  4:01 PM   Specimen: Nasopharyngeal Swab  Result Value Ref Range Status   SARS Coronavirus 2 POSITIVE (A) NEGATIVE Final    Comment: RESULT CALLED TO, READ BACK BY AND VERIFIED WITH: HALL,C. RN @1846  ON 10.22.2020 BY COHEN,K (NOTE) If result is NEGATIVE SARS-CoV-2 target nucleic acids are NOT DETECTED. The SARS-CoV-2 RNA is generally detectable in upper and lower  respiratory specimens during the acute phase of infection. The lowest  concentration of SARS-CoV-2 viral copies this assay can detect is 250  copies / mL. A negative result does not preclude SARS-CoV-2 infection  and should not be used as the sole basis for treatment or other  patient management decisions.  A negative result may occur with  improper specimen collection / handling, submission of specimen other  than nasopharyngeal swab, presence of viral mutation(s) within the  areas targeted by this assay, and inadequate number of viral copies  (<250 copies / mL). A negative result must be combined with clinical  observations, patient history, and epidemiological information. If  result is POSITIVE SARS-CoV-2 target nucleic acids are DETECT ED. The SARS-CoV-2 RNA is generally detectable in upper and lower  respiratory specimens during the acute phase of infection.  Positive  results are indicative of active infection with SARS-CoV-2.  Clinical  correlation with patient history and other diagnostic information is  necessary to determine patient infection status.  Positive results do  not rule out bacterial infection or co-infection with other viruses.  If result is PRESUMPTIVE POSTIVE SARS-CoV-2 nucleic acids MAY BE PRESENT.   A presumptive positive result was obtained on the submitted specimen  and confirmed on repeat testing.  While 2019 novel coronavirus  (SARS-CoV-2) nucleic acids may be present in the submitted sample  additional confirmatory testing may be necessary for epidemiological  and / or clinical management purposes  to differentiate between  SARS-CoV-2 and other Sarbecovirus currently known to infect humans.  If clinically indicated additional testing with an alternate test  methodology ([id number] [id number]) is advised. The SARS-CoV-2 RNA is generally  detectable in upper and lower respiratory specimens during the acute  phase of infection. The expected result is Negative. Fact Sheet for Patients:  StrictlyIdeas.no Fact Sheet for Healthcare Providers: BankingDealers.co.za This test is not yet approved or cleared by the [place] FDA and has been authorized for detection and/or diagnosis of SARS-CoV-2 by FDA under an Emergency Use Authorization (EUA).  This EUA will remain in effect (meaning this test can be used) for the duration of the COVID-19 declaration under Section 564(b)(1) of the Act, 21 U.S.C. section 360bbb-3(b)(1), unless the authorization is terminated or revoked sooner. Performed at [facility], [place] [address], [place], [place] [address]   Blood culture (routine x 2)      Status: None   Collection Time: 04/20/19  4:01 PM   Specimen: BLOOD LEFT HAND  Result Value Ref Range Status   Specimen Description   Final    BLOOD LEFT HAND Performed at [place] [address] [place], [place] [address]    Special Requests   Final    BOTTLES DRAWN AEROBIC AND ANAEROBIC Blood Culture adequate volume Performed at [place] [address], [place], [place] [address]    Culture   Final    NO GROWTH 5 DAYS Performed at [facility], [place] [address] [place], [place] [address]    Report Status 04/25/2019 FINAL  Final      Radiology Studies: Dg Chest 1 View  Result Date: 04/25/2019 CLINICAL DATA:  Dyspnea EXAM: CHEST  1 VIEW COMPARISON:  04/22/2019 FINDINGS: Cardiac shadow remains prominent. Aortic calcifications are seen. Patchy left basilar infiltrate with associated small effusion is noted. This has increased slightly in the interval from the prior exam. The right lung is clear. No acute bony abnormality is noted. IMPRESSION: Left basilar infiltrate with small associated effusion. Electronically Signed   By: Inez Catalina M.D.   On: 04/25/2019 19:07    Scheduled Meds: . apixaban  5 mg Oral BID  . chlorpheniramine-HYDROcodone  5 mL Oral Q12H  . colchicine  0.6 mg Oral BID  . dexamethasone (DECADRON) injection  6 mg Intravenous Q24H  . escitalopram  20 mg Oral Daily  . furosemide  40 mg Oral BID  . gabapentin  300 mg Oral QHS  . Ipratropium-Albuterol  1 puff Inhalation Q6H  . levothyroxine  150 mcg Oral Q0600  . mometasone-formoterol  2 puff Inhalation BID  . montelukast  10 mg Oral Daily  . multivitamin with minerals  1 tablet Oral Daily  . nortriptyline  75 mg Oral QHS  . propranolol  10 mg Oral BID  . vitamin C  500 mg Oral Daily  . zinc sulfate  220 mg Oral Daily   Continuous Infusions:   LOS: 6 days   Time spent: 25 minutes.  Patrecia Pour, MD [facility] [email or web address] 04/26/2019, 2:58 PM

## 2019-04-26 NOTE — Progress Notes (Signed)
Physical Therapy Treatment Patient Details Name: Wayne Booth MRN: [id number] DOB: 1941/04/01 Today's Date: 04/26/2019    History of Present Illness 78 year old who presented with dyspnea.  He does have significant past medical history of asthma, chronic hypoxic respiratory failure (2 L per nasal cannula), morbid obesity, pulmonary embolism, obstructive sleep apnea, atrial fibrillation, gout, hypertension, hypothyroidism, history of prostate and renal cancer, coronary artery disease.  Patient reported worsening dyspnea, increased oxygen requirements up to 4 L per nasal cannula and fever.  His wife tested positive for COVID-19 2 weeks ago, Patient positive for  COVID-19.    PT Comments    Worked on breathing capacity working with incentive spirometer and flutter valve. Pt did well with both and was able to maintain 02 sats in 90s on room air. Pt also ambulated 1 x 48ft with no AD and CGA-SBA on room air, then seated rest break and further 2 x 36ft with SBA-CGA and vcs on room air. With both ambulation distances managed to maintain sats >90. Completed seated there ex to BUE and BLE with red theraband.    Follow Up Recommendations  Home health PT     Equipment Recommendations  None recommended by PT    Recommendations for Other Services       Precautions / Restrictions Precautions Precautions: Fall Precaution Comments: watch 02 sats Restrictions Weight Bearing Restrictions: No    Mobility  Bed Mobility               General bed mobility comments: pt received sitting in recliner, has just completed bathing himself  Transfers Overall transfer level: Needs assistance Equipment used: None Transfers: Sit to/from Stand Sit to Stand: Supervision         General transfer comment: declined use of RW today  Ambulation/Gait Ambulation/Gait assistance: Min guard Gait Distance (Feet): 88 Feet Assistive device: None Gait Pattern/deviations: Step-through pattern;Wide base  of support     General Gait Details: ambulated 1 x 50ft then seated rest and 2 x 34ft with CGA-SBA on room air initially desat to 92% on later trial desat to 90% measured via pedi earlobe probe   Stairs             Wheelchair Mobility    Modified Rankin (Stroke Patients Only)       Balance Overall balance assessment: Needs assistance Sitting-balance support: Feet unsupported Sitting balance-Leahy Scale: Good     Standing balance support: During functional activity Standing balance-Leahy Scale: Fair                              Cognition Arousal/Alertness: Awake/alert Behavior During Therapy: WFL for tasks assessed/performed Overall Cognitive Status: Within Functional Limits for tasks assessed                                        Exercises General Exercises - Upper Extremity Shoulder Flexion: Strengthening;10 reps;Seated;Theraband Theraband Level (Shoulder Flexion): Level 2 (Red) Shoulder Extension: Strengthening;Both;10 reps;Seated;Theraband Theraband Level (Shoulder Extension): Level 2 (Red) Shoulder Horizontal ABduction: Strengthening;10 reps;Theraband;Seated Theraband Level (Shoulder Horizontal Abduction): Level 2 (Red) Elbow Flexion: Strengthening;10 reps;Seated;Theraband Theraband Level (Elbow Flexion): Level 2 (Red) Elbow Extension: Strengthening;10 reps;Seated Theraband Level (Elbow Extension): Level 2 (Red) General Exercises - Lower Extremity Long Arc Quad: 10 reps;Seated;Strengthening Hip ABduction/ADduction: Strengthening;10 reps;Seated Hip Flexion/Marching: Strengthening;10 reps;Seated Other Exercises Other Exercises: incentive spirometer x 10  Other Exercises: flutter valve x 10    General Comments General comments (skin integrity, edema, etc.): Pt did well with tx was able to complete bathing prior to tx and also tolerate tx. needs increased rest time between activities but able to complete all. Pt is on room air and  was able to maintain sats in 90s, only complain he has is some pain in chest with breathing or when out of breath but states that this only lasts a little while then goes away. Was able to complete flutter valve and incentive spirometer, also seated ther ex to BUE and BLE with red theraband and cues x 10 each B.      Pertinent Vitals/Pain Pain Assessment: Faces Faces Pain Scale: Hurts little more Pain Location: chest w/ breathing Pain Descriptors / Indicators: Burning Pain Intervention(s): Limited activity within patient's tolerance    Home Living Family/patient expects to be discharged to:: Private residence Living Arrangements: Spouse/significant other Available Help at Discharge: Family;Available 24 hours/day Type of Home: House Home Access: Level entry   Home Layout: Able to live on main level with bedroom/bathroom Home Equipment: Kasandra Knudsen - single point      Prior Function Level of Independence: Independent          PT Goals (current goals can now be found in the care plan section) Acute Rehab PT Goals Patient Stated Goal: get over this virus PT Goal Formulation: With patient Time For Goal Achievement: 05/08/19 Potential to Achieve Goals: Good Progress towards PT goals: Progressing toward goals    Frequency    Min 3X/week      PT Plan Discharge plan needs to be updated    Co-evaluation              AM-PAC PT "6 Clicks" Mobility   Outcome Measure  Help needed turning from your back to your side while in a flat bed without using bedrails?: A Little Help needed moving from lying on your back to sitting on the side of a flat bed without using bedrails?: A Little Help needed moving to and from a bed to a chair (including a wheelchair)?: A Little Help needed standing up from a chair using your arms (e.g., wheelchair or bedside chair)?: A Little Help needed to walk in hospital room?: A Little Help needed climbing 3-5 steps with a railing? : A Little 6 Click  Score: 18    End of Session   Activity Tolerance: Patient tolerated treatment well   Nurse Communication: Mobility status PT Visit Diagnosis: Difficulty in walking, not elsewhere classified (R26.2);Unsteadiness on feet (R26.81)     Time: [id number] PT Time Calculation (min) (ACUTE ONLY): 29 min  Charges:  $Gait Training: 8-22 mins $Therapeutic Exercise: 8-22 mins                     Horald Chestnut, PT    Delford Field 04/26/2019, 3:54 PM

## 2019-04-27 DIAGNOSIS — I2510 Atherosclerotic heart disease of native coronary artery without angina pectoris: Secondary | ICD-10-CM | POA: Diagnosis not present

## 2019-04-27 DIAGNOSIS — I10 Essential (primary) hypertension: Secondary | ICD-10-CM | POA: Diagnosis not present

## 2019-04-27 DIAGNOSIS — U071 COVID-19: Secondary | ICD-10-CM | POA: Diagnosis not present

## 2019-04-27 DIAGNOSIS — J9621 Acute and chronic respiratory failure with hypoxia: Secondary | ICD-10-CM | POA: Diagnosis not present

## 2019-04-27 LAB — COMPREHENSIVE METABOLIC PANEL
ALT: 62 U/L — ABNORMAL HIGH (ref 0–44)
AST: 47 U/L — ABNORMAL HIGH (ref 15–41)
Albumin: 3.1 g/dL — ABNORMAL LOW (ref 3.5–5.0)
Alkaline Phosphatase: 72 U/L (ref 38–126)
Anion gap: 11 (ref 5–15)
BUN: 32 mg/dL — ABNORMAL HIGH (ref 8–23)
CO2: 28 mmol/L (ref 22–32)
Calcium: 8.8 mg/dL — ABNORMAL LOW (ref 8.9–10.3)
Chloride: 96 mmol/L — ABNORMAL LOW (ref 98–111)
Creatinine, Ser: 1.11 mg/dL (ref 0.61–1.24)
GFR calc Af Amer: >60 mL/min (ref >60)
GFR calc non Af Amer: >60 mL/min (ref >60)
Glucose, Bld: 138 mg/dL — ABNORMAL HIGH (ref 70–99)
Potassium: 4.4 mmol/L (ref 3.5–5.1)
Sodium: 135 mmol/L (ref 135–145)
Total Bilirubin: 0.4 mg/dL (ref 0.3–1.2)
Total Protein: 6.7 g/dL (ref 6.5–8.1)

## 2019-04-27 LAB — CBC WITH DIFFERENTIAL/PLATELET
Abs Immature Granulocytes: 0.83 10*3/uL — ABNORMAL HIGH (ref 0.00–0.07)
Basophils Absolute: 0 10*3/uL (ref 0.0–0.1)
Basophils Relative: 0 %
Eosinophils Absolute: 0.1 10*3/uL (ref 0.0–0.5)
Eosinophils Relative: 0 %
HCT: 51 % (ref 39.0–52.0)
Hemoglobin: 16.5 g/dL (ref 13.0–17.0)
Immature Granulocytes: 5 %
Lymphocytes Relative: 9 %
Lymphs Abs: 1.5 10*3/uL (ref 0.7–4.0)
MCH: 30.6 pg (ref 26.0–34.0)
MCHC: 32.4 g/dL (ref 30.0–36.0)
MCV: 94.4 fL (ref 80.0–100.0)
Monocytes Absolute: 1.9 10*3/uL — ABNORMAL HIGH (ref 0.1–1.0)
Monocytes Relative: 11 %
Neutro Abs: 12.8 10*3/uL — ABNORMAL HIGH (ref 1.7–7.7)
Neutrophils Relative %: 75 %
Platelets: 473 10*3/uL — ABNORMAL HIGH (ref 150–400)
RBC: 5.4 MIL/uL (ref 4.22–5.81)
RDW: 14.4 % (ref 11.5–15.5)
WBC: 17 10*3/uL — ABNORMAL HIGH (ref 4.0–10.5)
nRBC: 0 % (ref 0.0–0.2)

## 2019-04-27 LAB — C-REACTIVE PROTEIN: CRP: 1.5 mg/dL — ABNORMAL HIGH (ref <1.0)

## 2019-04-27 NOTE — Progress Notes (Signed)
OT Note SATURATION QUALIFICATIONS: (This note is used to comply with regulatory documentation for home oxygen)  Patient Saturations on Room Air at Rest = 98%  Patient Saturations on Room Air while Ambulating 100 ft = 86%  Patient Saturations on 2 Liters of oxygen while Ambulating = 89%  Please briefly explain why patient needs home oxygen: 2L O2 necessary to maintain SpO2 above 88 during activity.   Maurie Boettcher, OT/L   Acute OT Clinical Specialist Acute Rehabilitation Services Pager [phone] Office [phone]

## 2019-04-27 NOTE — Progress Notes (Signed)
Occupational Therapy Treatment Patient Details Name: Wayne Booth MRN: [id number] DOB: 1940-11-25 Today's Date: 04/27/2019    History of present illness 78 year old who presented with dyspnea.  He does have significant past medical history of asthma, chronic hypoxic respiratory failure (2 L per nasal cannula), morbid obesity, pulmonary embolism, obstructive sleep apnea, atrial fibrillation, gout, hypertension, hypothyroidism, history of prostate and renal cancer, coronary artery disease.  Patient reported worsening dyspnea, increased oxygen requirements up to 4 L per nasal cannula and fever.  His wife tested positive for COVID-19 2 weeks ago, Patient positive for  COVID-19.   OT comments  At rest, pt SpO2 98-100. Pt ambulated @ 100 ft, requiring 3 rest breaks and desats to 86 at end of walk with 3/4 DOE. Pt able to maintain SpO2 above 89 when using 2L. Pt has difficulty correctly using flutter valve and incentive spirometer, therefore instructions written on devices to help pt use appropriately.  Educated on energy conservation and handout reviewed. Continued to encourage pt to complete his theraband HEP to increase stregth due to overall deconditioning. Continue to recommend [place].  Encourage staff to walk with pt.   Follow Up Recommendations  Home health OT;Supervision - Intermittent    Equipment Recommendations  None recommended by OT    Recommendations for Other Services      Precautions / Restrictions Precautions Precautions: Fall Precaution Comments: watch 02 sats       Mobility Bed Mobility               General bed mobility comments: in recliner. Does not sleep in a bed at home  Transfers Overall transfer level: Needs assistance   Transfers: Sit to/from Stand Sit to Stand: Supervision              Balance                                           ADL either performed or assessed with clinical judgement   ADL Overall ADL's : Needs  assistance/impaired                                     Functional mobility during ADLs: Supervision/safety;Rolling walker;Cueing for safety General ADL Comments: Pt uses sock aid at [employer] for Socks. Educated on energy conservation adn handout given. Pt delicned to brush teeth at sink this am. REcommended pt use shower seat when bathing. Pt verbalized understanding.      Vision       Perception     Praxis      Cognition Arousal/Alertness: Awake/alert Behavior During Therapy: Anxious Overall Cognitive Status: Within Functional Limits for tasks assessed                                 General Comments: although slow processing; difficulty managing incentive spirometer correctly        Exercises Other Exercises Other Exercises: incentive spirometer x 10 - able to pull 755ml Other Exercises: flutter valve x 10   Shoulder Instructions       General Comments      Pertinent Vitals/ Pain       Pain Assessment: No/denies pain  Home Living  Prior Functioning/Environment              Frequency  Min 3X/week        Progress Toward Goals  OT Goals(current goals can now be found in the care plan section)  Progress towards OT goals: Progressing toward goals  Acute Rehab OT Goals Patient Stated Goal: get over this virus OT Goal Formulation: With patient Time For Goal Achievement: 05/10/19 Potential to Achieve Goals: Good ADL Goals Pt Will Perform Lower Body Dressing: with modified independence;sit to/from stand;with adaptive equipment Pt Will Transfer to Toilet: with modified independence;ambulating Pt Will Perform Toileting - Clothing Manipulation and hygiene: with modified independence;sit to/from stand Additional ADL Goal #1: Complete ADL activity wiht SpO2 above 90 using pursed lip breathing technique independently Additional ADL Goal #2: Pt will independently verbalize 3  strategies to reduce risk of falls.  Plan Discharge plan remains appropriate    Co-evaluation                 AM-PAC OT "6 Clicks" Daily Activity     Outcome Measure   Help from another person eating meals?: None Help from another person taking care of personal grooming?: A Little Help from another person toileting, which includes using toliet, bedpan, or urinal?: A Little Help from another person bathing (including washing, rinsing, drying)?: A Little Help from another person to put on and taking off regular upper body clothing?: A Little Help from another person to put on and taking off regular lower body clothing?: A Little 6 Click Score: 19    End of Session Equipment Utilized During Treatment: Oxygen;Rolling walker(2L)  OT Visit Diagnosis: Unsteadiness on feet (R26.81);Muscle weakness (generalized) (M62.81);Pain   Activity Tolerance Patient tolerated treatment well   Patient Left in chair;with call bell/phone within reach   Nurse Communication Mobility status        Time: 1105-1140 OT Time Calculation (min): 35 min  Charges: OT General Charges $OT Visit: 1 Visit OT Treatments $Self Care/Home Management : 23-37 mins  Maurie Boettcher, OT/L   Acute OT Clinical Specialist [place] Pager [phone] Office [phone]    [facility] 04/27/2019, 1:19 PM

## 2019-04-27 NOTE — Progress Notes (Addendum)
PROGRESS NOTE  Wayne Booth  [id number] DOB: Nov 20, 1940 DOA: 04/20/2019 PCP: Marin Olp, MD   Brief Narrative: Wayne Booth is a 78 y.o. male with a history of intermittent 2L O2-dependence, hx PE, OSA, asthma, AFib, CAD, HTN, hypothyroidism, history of prostate CA and RCC whose wife recently tested positive for covid-19. He presented with dyspnea requiring 4L O2 and was confirmed to have positive SARS-CoV-2. CXR demonstrated bilateral infiltrates. Remdesivir and steroids were started, though he later developed acute pulmonary edema due to acute on chronic cor pulmonale and was given IV lasix with improvement. He remains deconditioned and hypoxic, albeit improved.   Assessment & Plan: Principal Problem:   Pneumonia due to COVID-19 virus Active Problems:   History of prostate cancer-seed implantation 2010.  Dr. Alinda Money   History of renal cell carcinoma   Morbid obesity ([place])   Essential hypertension   OSA (obstructive sleep apnea)   Hypothyroidism   CAD (coronary artery disease)   Hyponatremia   Acute on chronic respiratory failure with hypoxia and hypercapnia (HCC)  Acute on chronic hypoxic respiratory failure due to covid-19 pneumonia, moderate persistent asthma, OSA, history of PE: The multitude and severity of comorbidities make this patient high risk for decompensation and poor outcome. Last year he required life support in the setting of pneumonia. Note RV function and pulmonary artery size by echo of limited quality were grossly normal - Continue supplemental oxygen as needed, though per PT, ambulated 47ft x3 yesterday without SpO2 below 90% on room air.   - Has completed remdesivir (10/22 - 10/26) - Continue steroids x10 days (10/22 - 10/31). CRP shows improvement overall, stability from yesterday. Will monitor again in AM. - Airborne/contact precautions - Encourage proning, incentive spirometry, OOB as much as possible. Strongly encouraged these and requested  demonstration today.  - Antitussives - Vitamin C, zinc - Inhalers scheduled and prn, singulair. No wheezing to suggest asthma exacerbation. - Continue apixaban  - Cannot give CPAP/BiPAP in setting of covid-19, but can use HFNC/HHF qHS if needed.   Deconditioning: Due to covid-19 infection. Suspect this is playing a significant role in symptom burden. - Continue PT and OT at home when discharged.   Acute on chronic HFpEF:  - Keep negative balance, no longer volume overloaded and hypoxia improved.  - Continue home lasix 40mg  po BID - Monitor I/O, daily weights  AKI on stage IIIa CKD: Has stabilized.  - Monitor metabolic panel - Avoid nephrotoxins  LFT elevation: Mild, roughly stable. Suspected to be due to covid-19 infection. - Continue monitoring.   HTN:  - Continue home propranolol (also for ET)  Paroxysmal atrial fibrillation s/p ablation 2019: NSR on ECG - Propranolol, eliquis as above.   Morbid obesity: Noted  Hypothyroidism: Last TSH 0.45 in Dec 2019. - Continue synthroid   Depression, anxiety:  - Continue home medications including SSRI, nortriptyline, buspar, trazodone, and prn lorazepam.   DVT prophylaxis: Eliquis Code Status: Full Family Communication: Wife by phone daily. Disposition Plan: Anticipate DC homer with maximal home health if stable 10/30.  Consultants:   None  Procedures:   None  Antimicrobials:  Remdesivir 10/22 - 10/26   Subjective: Feels coughing has improved, still severely short of breath with exertion though hypoxia documented has shown improvement. He's frustrated that he doesn't feel he's benefited from hospitalization. Eager to return home tomorrow. Had not been using FV or IS prior to yesterday. Can pull about 700cc.  Objective: Vitals:   04/27/19 0340 04/27/19 0353 04/27/19 [id number]  04/27/19 0844  BP: 111/79  138/70   Pulse: 81   83  Resp: 16     Temp: 98 F (36.7 C)     TempSrc: Oral     SpO2: 100%     Weight:  124.2 kg     Height:        Intake/Output Summary (Last 24 hours) at 04/27/2019 0939 Last data filed at 04/26/2019 2000 Gross per 24 hour  Intake 480 ml  Output -  Net 480 ml   Filed Weights   04/21/19 0556 04/27/19 0353  Weight: 124.7 kg 124.2 kg   Gen: Obese, calm, 78yo male in no distress Pulm: Nonlabored breathing room air. Good air movement without wheezes, distant crackles at bases. CV: Regular rate and rhythm. No murmur, rub, or gallop. No JVD, no pitting dependent edema. GI: Abdomen soft, non-tender, non-distended, with normoactive bowel sounds.  Ext: Warm, no deformities Skin: No rashes, lesions or ulcers on visualized skin. Neuro: Alert and oriented. No focal neurological deficits. Psych: Judgement and insight appear fair. Mood euthymic & affect congruent. Behavior is appropriate.    Data Reviewed: I have personally reviewed following labs and imaging studies  CBC: Recent Labs  Lab 04/20/19 1915 04/24/19 0250 04/27/19 0355  WBC 6.6 12.7* 17.0*  NEUTROABS 4.8 9.7* 12.8*  HGB 18.1* 16.3 16.5  HCT 53.9* 50.0 51.0  MCV 93.6 95.2 94.4  PLT 274 359 [id number]*   Basic Metabolic Panel: Recent Labs  Lab 04/20/19 1601  04/23/19 0411 04/24/19 0250 04/25/19 0435 04/26/19 0430 04/27/19 0355  NA 130*   < > 134* 136 138 137 135  K 4.9   < > 4.7 4.5 4.4 4.3 4.4  CL 94*   < > 100 97* 98 95* 96*  CO2 26   < > 24 29 32 31 28  GLUCOSE 96   < > 151* 108* 104* 119* 138*  BUN 20   < > 29* 27* 27* 32* 32*  CREATININE 1.75*   < > 1.19 1.15 1.13 1.12 1.11  CALCIUM 8.2*   < > 8.2* 8.3* 8.6* 8.6* 8.8*  MG 2.1  --   --   --   --   --   --    < > = values in this interval not displayed.   GFR: Estimated Creatinine Clearance: 76 mL/min (by C-G formula based on SCr of 1.11 mg/dL). Liver Function Tests: Recent Labs  Lab 04/23/19 0411 04/24/19 0250 04/25/19 0435 04/26/19 0430 04/27/19 0355  AST 42* 43* 40 52* 47*  ALT 27 41 48* 57* 62*  ALKPHOS 61 71 69 68 72  BILITOT 1.2 0.3 0.5 0.4  0.4  PROT 6.1* 6.4* 6.2* 6.4* 6.7  ALBUMIN 2.7* 2.9* 2.8* 2.8* 3.1*   No results for input(s): LIPASE, AMYLASE in the last 168 hours. No results for input(s): AMMONIA in the last 168 hours. Coagulation Profile: Recent Labs  Lab 04/20/19 1915  INR 1.3*   Cardiac Enzymes: No results for input(s): CKTOTAL, CKMB, CKMBINDEX, TROPONINI in the last 168 hours. BNP (last 3 results) Recent Labs    11/23/18 1103 01/24/19 1024 03/15/19 1209  PROBNP 23.0 27.0 31.0   HbA1C: No results for input(s): HGBA1C in the last 72 hours. CBG: No results for input(s): GLUCAP in the last 168 hours. Lipid Profile: No results for input(s): CHOL, HDL, LDLCALC, TRIG, CHOLHDL, LDLDIRECT in the last 72 hours. Thyroid Function Tests: No results for input(s): TSH, T4TOTAL, FREET4, T3FREE, THYROIDAB in the last 72 hours.  Anemia Panel: Recent Labs    04/25/19 0435 04/26/19 0430  FERRITIN 311 312   Urine analysis:    Component Value Date/Time   COLORURINE AMBER (A) 04/20/2019 1438   APPEARANCEUR CLEAR 04/20/2019 1438   LABSPEC 1.017 04/20/2019 1438   PHURINE 5.0 04/20/2019 1438   GLUCOSEU NEGATIVE 04/20/2019 1438   HGBUR MODERATE (A) 04/20/2019 1438   HGBUR 2+ 09/22/2007 1444   BILIRUBINUR NEGATIVE 04/20/2019 1438   BILIRUBINUR n 11/07/2015 1417   KETONESUR NEGATIVE 04/20/2019 1438   PROTEINUR NEGATIVE 04/20/2019 1438   UROBILINOGEN 1.0 11/07/2015 1417   UROBILINOGEN 1.0 05/22/2011 1454   NITRITE NEGATIVE 04/20/2019 1438   LEUKOCYTESUR NEGATIVE 04/20/2019 1438   Recent Results (from the past 240 hour(s))  Urine culture     Status: None   Collection Time: 04/20/19  2:38 PM   Specimen: In/Out Cath Urine  Result Value Ref Range Status   Specimen Description   Final    IN/OUT CATH URINE Performed at [facility], [place] [address], [place], [place] [address]    Special Requests   Final    NONE Performed at [facility], [place] [address], [place], [place]  [address]    Culture   Final    NO GROWTH Performed at [facility], [place] [address], [place], [place] [address]    Report Status 04/22/2019 FINAL  Final  Blood culture (routine x 2)     Status: None   Collection Time: 04/20/19  3:56 PM   Specimen: BLOOD  Result Value Ref Range Status   Specimen Description   Final    BLOOD RIGHT ANTECUBITAL Performed at [place] [address] [place], [place] [address]    Special Requests   Final    BOTTLES DRAWN AEROBIC AND ANAEROBIC Blood Culture results may not be optimal due to an inadequate volume of blood received in culture bottles Performed at [place] [address] [place], [place] [address]    Culture   Final    NO GROWTH 5 DAYS Performed at [facility], [place] [address], [place], [place] [address]    Report Status 04/25/2019 FINAL  Final  SARS Coronavirus 2 by RT PCR (hospital order, performed in [facility] hospital lab) Nasopharyngeal Nasopharyngeal Swab     Status: Abnormal   Collection Time: 04/20/19  4:01 PM   Specimen: Nasopharyngeal Swab  Result Value Ref Range Status   SARS Coronavirus 2 POSITIVE (A) NEGATIVE Final    Comment: RESULT CALLED TO, READ BACK BY AND VERIFIED WITH: HALL,C. RN @1846  ON 10.22.2020 BY COHEN,K (NOTE) If result is NEGATIVE SARS-CoV-2 target nucleic acids are NOT DETECTED. The SARS-CoV-2 RNA is generally detectable in upper and lower  respiratory specimens during the acute phase of infection. The lowest  concentration of SARS-CoV-2 viral copies this assay can detect is 250  copies / mL. A negative result does not preclude SARS-CoV-2 infection  and should not be used as the sole basis for treatment or other  patient management decisions.  A negative result may occur with  improper specimen collection / handling, submission of specimen other  than nasopharyngeal swab, presence of viral mutation(s) within the  areas targeted by this assay, and  inadequate number of viral copies  (<250 copies / mL). A negative result must be combined with clinical  observations, patient history, and epidemiological information. If result is POSITIVE SARS-CoV-2 target nucleic acids are DETECT ED. The SARS-CoV-2  RNA is generally detectable in upper and lower  respiratory specimens during the acute phase of infection.  Positive  results are indicative of active infection with SARS-CoV-2.  Clinical  correlation with patient history and other diagnostic information is  necessary to determine patient infection status.  Positive results do  not rule out bacterial infection or co-infection with other viruses. If result is PRESUMPTIVE POSTIVE SARS-CoV-2 nucleic acids MAY BE PRESENT.   A presumptive positive result was obtained on the submitted specimen  and confirmed on repeat testing.  While 2019 novel coronavirus  (SARS-CoV-2) nucleic acids may be present in the submitted sample  additional confirmatory testing may be necessary for epidemiological  and / or clinical management purposes  to differentiate between  SARS-CoV-2 and other Sarbecovirus currently known to infect humans.  If clinically indicated additional testing with an alternate test  methodology (LAB74 53) is advised. The SARS-CoV-2 RNA is generally  detectable in upper and lower respiratory specimens during the acute  phase of infection. The expected result is Negative. Fact Sheet for Patients:  StrictlyIdeas.no Fact Sheet for Healthcare Providers: BankingDealers.co.za This test is not yet approved or cleared by the [place] FDA and has been authorized for detection and/or diagnosis of SARS-CoV-2 by FDA under an Emergency Use Authorization (EUA).  This EUA will remain in effect (meaning this test can be used) for the duration of the COVID-19 declaration under Section 564(b)(1) of the Act, 21 U.S.C. section 360bbb-3(b)(1), unless the  authorization is terminated or revoked sooner. Performed at [facility], [place] [address], [place], [place] [address]   Blood culture (routine x 2)     Status: None   Collection Time: 04/20/19  4:01 PM   Specimen: BLOOD LEFT HAND  Result Value Ref Range Status   Specimen Description   Final    BLOOD LEFT HAND Performed at [facility] [address], [place], [place] [address]    Special Requests   Final    BOTTLES DRAWN AEROBIC AND ANAEROBIC Blood Culture adequate volume Performed at [place] [address] [place], [place] [address]    Culture   Final    NO GROWTH 5 DAYS Performed at [facility], [place] [address], [place], [place] [address]    Report Status 04/25/2019 FINAL  Final      Radiology Studies: Dg Chest 1 View  Result Date: 04/25/2019 CLINICAL DATA:  Dyspnea EXAM: CHEST  1 VIEW COMPARISON:  04/22/2019 FINDINGS: Cardiac shadow remains prominent. Aortic calcifications are seen. Patchy left basilar infiltrate with associated small effusion is noted. This has increased slightly in the interval from the prior exam. The right lung is clear. No acute bony abnormality is noted. IMPRESSION: Left basilar infiltrate with small associated effusion. Electronically Signed   By: Inez Catalina M.D.   On: 04/25/2019 19:07    Scheduled Meds: . apixaban  5 mg Oral BID  . chlorpheniramine-HYDROcodone  5 mL Oral Q12H  . colchicine  0.6 mg Oral BID  . dexamethasone (DECADRON) injection  6 mg Intravenous Q24H  . escitalopram  20 mg Oral Daily  . furosemide  40 mg Oral BID  . gabapentin  300 mg Oral QHS  . Ipratropium-Albuterol  1 puff Inhalation Q6H  . levothyroxine  150 mcg Oral Q0600  . mometasone-formoterol  2 puff Inhalation BID  . montelukast  10 mg Oral Daily  . multivitamin with minerals  1 tablet Oral Daily  . nortriptyline  75  mg Oral QHS  . propranolol  10 mg Oral BID  . vitamin C  500 mg Oral Daily  .  zinc sulfate  220 mg Oral Daily   Continuous Infusions:   LOS: 7 days   Time spent: 25 minutes.  Patrecia Pour, MD [facility] [email or web address] 04/27/2019, 9:39 AM

## 2019-04-27 NOTE — Progress Notes (Signed)
Update called to patient's wife

## 2019-04-28 DIAGNOSIS — U071 COVID-19: Secondary | ICD-10-CM | POA: Diagnosis not present

## 2019-04-28 DIAGNOSIS — I10 Essential (primary) hypertension: Secondary | ICD-10-CM | POA: Diagnosis not present

## 2019-04-28 DIAGNOSIS — J9621 Acute and chronic respiratory failure with hypoxia: Secondary | ICD-10-CM | POA: Diagnosis not present

## 2019-04-28 DIAGNOSIS — I2510 Atherosclerotic heart disease of native coronary artery without angina pectoris: Secondary | ICD-10-CM | POA: Diagnosis not present

## 2019-04-28 MED ORDER — DEXAMETHASONE 6 MG PO TABS: 6.0000 mg | ORAL_TABLET | Freq: Every day | ORAL | 0 refills | Status: AC

## 2019-04-28 NOTE — Progress Notes (Signed)
Physical Therapy Treatment Patient Details Name: Wayne Booth MRN: [id number] DOB: February 15, 1941 Today's Date: 04/28/2019    History of Present Illness 78 year old who presented with dyspnea.  He does have significant past medical history of asthma, chronic hypoxic respiratory failure (2 L per nasal cannula), morbid obesity, pulmonary embolism, obstructive sleep apnea, atrial fibrillation, gout, hypertension, hypothyroidism, history of prostate and renal cancer, coronary artery disease.  Patient reported worsening dyspnea, increased oxygen requirements up to 4 L per nasal cannula and fever.  His wife tested positive for COVID-19 2 weeks ago, Patient positive for  COVID-19.    PT Comments    Pt does fairly well with mobility this am. He is able to complete ambulation in hall approx 181ft with RW and min guard assist on 1L/min via [place] and maintain sats in low 90s but is noted to have difficult time with breathing. Even with cues pt has great difficulty with taking deep longer breaths and is noted to be taking short shallow breaths. Attempted to reinforce this with use of incentive spirometer and and flutter valve with same results, difficulty with sequencing each. Pt states he does not feel as if he has improve much since admission.     Follow Up Recommendations  Home health PT     Equipment Recommendations  Rolling walker with 5" wheels    Recommendations for Other Services       Precautions / Restrictions Precautions Precautions: Fall Precaution Comments: watch 02 sats Restrictions Weight Bearing Restrictions: No    Mobility  Bed Mobility               General bed mobility comments: pt in recliner at arrival  Transfers Overall transfer level: Needs assistance Equipment used: None Transfers: Sit to/from Stand Sit to Stand: Supervision         General transfer comment: initially declines use of walker and looks slightly shaky, therapist educated on using walker as  just a tool  Ambulation/Gait Ambulation/Gait assistance: Min guard Gait Distance (Feet): 164 Feet Assistive device: Rolling walker (2 wheeled) Gait Pattern/deviations: Step-through pattern;Wide base of support     General Gait Details: 1x 162ft with RW and min guard on 1L/min via [place] sats lowest 95%, 1 x a23ft w/ RW and min guard w/ vc on 1L/min via [place] min dest 92% monitored on pedi earlobe probe.   Stairs             Wheelchair Mobility    Modified Rankin (Stroke Patients Only)       Balance Overall balance assessment: Needs assistance Sitting-balance support: Feet supported Sitting balance-Leahy Scale: Good   Postural control: (sitting with shouders rounded/head forward, ambulates same) Standing balance support: During functional activity Standing balance-Leahy Scale: Fair                              Cognition Arousal/Alertness: Awake/alert Behavior During Therapy: Anxious Overall Cognitive Status: Within Functional Limits for tasks assessed                                 General Comments: still having difficulty with incentive spirometer vs fluter valve use, also seems to have some difficulty with taking slow deep breaths even with cues      Exercises Other Exercises Other Exercises: incentive spirometer x 5 reps, can pull to 48mo but has difficult time with sequencing Other Exercises: flutter  valve x 10 again difficulty with sequencing    General Comments General comments (skin integrity, edema, etc.): pt did fairly well with mobility was able to ambulate up to approx 137ft with RW and min guard assist on 1L/min [place] measured on earlobe probe and maintain sats in low 90s. upon return to room noted to be having difficulty with taking slow and deep breaths even with cues. He states that he does not feel as if he is ready to go home as he doesn't feel as if he is doing much better. He has been educated on energu concervation and  importance of completing breathing exercises. Again has great difficulty with incentive spirometer and flutter valve use.      Pertinent Vitals/Pain Pain Assessment: No/denies pain    Home Living                      Prior Function            PT Goals (current goals can now be found in the care plan section) Acute Rehab PT Goals Patient Stated Goal: states does not feel like he is ready to go home yet PT Goal Formulation: With patient Time For Goal Achievement: 05/08/19 Potential to Achieve Goals: Good Progress towards PT goals: Progressing toward goals    Frequency    Min 3X/week      PT Plan Current plan remains appropriate    Co-evaluation              AM-PAC PT "6 Clicks" Mobility   Outcome Measure  Help needed turning from your back to your side while in a flat bed without using bedrails?: A Little Help needed moving from lying on your back to sitting on the side of a flat bed without using bedrails?: A Little Help needed moving to and from a bed to a chair (including a wheelchair)?: A Little Help needed standing up from a chair using your arms (e.g., wheelchair or bedside chair)?: A Little Help needed to walk in hospital room?: A Little Help needed climbing 3-5 steps with a railing? : A Little 6 Click Score: 18    End of Session Equipment Utilized During Treatment: Oxygen Activity Tolerance: Treatment limited secondary to medical complications (Comment) Patient left: in chair;with call bell/phone within reach Nurse Communication: Mobility status PT Visit Diagnosis: Difficulty in walking, not elsewhere classified (R26.2);Unsteadiness on feet (R26.81)     Time: [id number] PT Time Calculation (min) (ACUTE ONLY): 28 min  Charges:  $Gait Training: 8-22 mins $Self Care/Home Management: [place], PT    Delford Field 04/28/2019, 1:09 PM

## 2019-04-28 NOTE — TOC Initial Note (Signed)
Transition of Care [facility]) - Initial/Assessment Note    Patient Details  Name: Wayne Booth MRN: [id number] Date of Birth: 02-17-1941  Transition of Care [facility]) CM/SW Contact:    Alberteen Sam, [place] Phone Number: [phone] 04/28/2019, 12:38 PM  Clinical Narrative:                  CSW spoke with patient's wife [name] who reports she will be at the hospital at 1pm to pick patient up. CSW informed her of home health PT, OT, RN and Aide set up with [facility]. She reports being agreeable and no questions at this time. States her home number of [phone] is accurate for [facility] to reach out to schedule appointments. No further questions or concerns at this time.   Expected Discharge Plan: [place] Barriers to Discharge: No Barriers Identified   Patient Goals and CMS Choice   CMS Medicare.gov Compare Post Acute Care list provided to:: Patient Choice offered to / list presented to : Patient  Expected Discharge Plan and Services Expected Discharge Plan: [place]       Living arrangements for the past 2 months: Single Family Home Expected Discharge Date: 04/28/19                         HH Arranged: Nurse's Aide, RN, OT, PT HH Agency: [place] [facility] Agency Contacted: 04/28/19 Time HH Agency Contacted: 1100 Representative spoke with at [place]: Tommi Rumps  Prior Living Arrangements/Services Living arrangements for the past 2 months: [place] with:: Self Patient language and need for interpreter reviewed:: Yes Do you feel safe going back to the place where you live?: Yes      Need for Family Participation in Patient Care: No (Comment) Care giver support system in place?: Yes (comment)   Criminal Activity/Legal Involvement Pertinent to Current Situation/Hospitalization: No - Comment as needed  Activities of Daily Living Home Assistive Devices/Equipment: CPAP, Eyeglasses, Cane (specify quad or  straight), Other (Comment), Dentures (specify type)(single point cane, walk-in shower, upper partial plate) ADL Screening (condition at time of admission) Patient's cognitive ability adequate to safely complete daily activities?: Yes Is the patient deaf or have difficulty hearing?: No Does the patient have difficulty seeing, even when wearing glasses/contacts?: No Does the patient have difficulty concentrating, remembering, or making decisions?: No Patient able to express need for assistance with ADLs?: Yes Does the patient have difficulty dressing or bathing?: Yes Independently performs ADLs?: Yes (appropriate for developmental age) Does the patient have difficulty walking or climbing stairs?: Yes(secondary to shortness of breath) Weakness of Legs: Both Weakness of Arms/Hands: None  Permission Sought/Granted Permission sought to share information with : Case Manager, Customer service manager, Family Supports    Share Information with NAME: [name]  Permission granted to share info w AGENCY: home health  Permission granted to share info w Relationship: spouse  Permission granted to share info w Contact Information: [phone]  Emotional Assessment Appearance:: Other (Comment Required(unable to assess - remote) Attitude/Demeanor/Rapport: Unable to Assess Affect (typically observed): Unable to Assess Orientation: : Oriented to Self, Oriented to Place, Oriented to  Time, Oriented to Situation Alcohol / Substance Use: Not Applicable Psych Involvement: No (comment)  Admission diagnosis:  Hypoxemia [R09.02] Dyspnea on exertion [R06.00] Community acquired pneumonia of right lung, unspecified part of lung [J18.9] Pneumonia due to COVID-19 virus [U07.1, J12.89] Patient Active Problem List   Diagnosis  Date Noted  . Acute on chronic respiratory failure with hypoxia and hypercapnia ([place]) 04/20/2019  . Pneumonia due to COVID-19 virus 04/20/2019  . PTSD (post-traumatic stress disorder)  07/27/2018  . Memory loss 06/10/2018  . Aortic atherosclerosis ([place]) 05/10/2018  . Chronic respiratory failure with hypoxia ([place]) 04/26/2018  . First degree AV block 04/15/2018  . Hyponatremia 04/15/2018  . Pleural effusion 04/14/2018  . CAD (coronary artery disease) 04/09/2018  . Chronic pain 04/09/2018  . CKD (chronic kidney disease) stage 3, GFR 30-59 ml/min   . Dyspnea   . HCAP (healthcare-associated pneumonia) 02/03/2018  . Constipation 01/21/2018  . (HFpEF) heart failure with preserved ejection fraction ([place]) 12/16/2017  . Chest pain   . Anticoagulated   . Atrial flutter ([place]) 11/01/2017  . Pain in left knee 07/15/2017  . Bilateral foot pain 07/13/2017  . Community acquired pneumonia 06/18/2017  . 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 VTE (venous thromboembolism) 06/09/2017  . History of adenomatous polyp of colon 04/12/2017  . Chronic pulmonary embolism ([place]) 10/23/2016  . Cervical disc disease 11/13/2015  . Essential tremor 08/14/2015  . Gout 08/14/2015  . Hypothyroidism 07/12/2014  . Hyperglycemia 07/12/2014  . GAD (generalized anxiety disorder) 11/07/2013  . OSA (obstructive sleep apnea) 07/07/2011  . Asthmatic bronchitis 06/09/2011  . Actinic keratosis 11/27/2009  . Osteoarthritis 11/13/2008  . History of prostate cancer-seed implantation 2010.  Dr. Alinda Money 10/02/2008  . Morbid obesity ([place]) 10/02/2008  . History of renal cell carcinoma 12/26/2007  . Insomnia 12/26/2007  . TESTOSTERONE DEFICIENCY 12/28/2006  . Essential hypertension 12/28/2006   PCP:  Marin Olp, MD Pharmacy:   [facility] [address], [place] [address] Phone: [phone] Fax: [phone]  [place], [place] [facility] [address] [place] [address] [place] [address] Phone: [phone] Fax: [place], [place] - [address] [place] [address] Phone: [phone] Fax: [phone]     Social Determinants of Health (SDOH) Interventions    Readmission Risk Interventions No flowsheet data found.

## 2019-04-28 NOTE — Discharge Summary (Signed)
Physician Discharge Summary  Wayne Booth [id number] DOB: Oct 27, 1940 DOA: 04/20/2019  PCP: Marin Olp, MD  Admit date: 04/20/2019 Discharge date: 04/28/2019  Admitted From: Home Disposition: Home   Recommendations for Outpatient Follow-up:  1. Follow up with PCP in 1-2 weeks 2. Please obtain BMP/CBC in one week 3. Continue pulmonology follow up with Dr. Elsworth Soho.   Home Health: PT, OT, RN, aide Equipment/Devices: Continue 2L O2 prn Discharge Condition: Stable CODE STATUS: Full Diet recommendation: Heart healthy  Brief/Interim Summary: Wayne Booth is a 78 y.o. male with a history of intermittent 2L O2-dependence, hx PE, OSA, asthma, AFib, CAD, HTN, hypothyroidism, history of prostate CA and RCC whose wife recently tested positive for covid-19. He presented with dyspnea requiring 4L O2 and was confirmed to have positive SARS-CoV-2. CXR demonstrated bilateral infiltrates. Remdesivir and steroids were started, though he later developed acute pulmonary edema due to acute on chronic cor pulmonale and was given IV lasix with improvement. Having completed remdesivir and 9 total days of steroid therapy, he is stable for discharge, having maximized benefit of hospitalization. He remains deconditioned and short of breath with exertion, despite improving hypoxia, which has been baseline prior to covid-19.   Discharge Diagnoses:  Principal Problem:   Pneumonia due to COVID-19 virus Active Problems:   History of prostate cancer-seed implantation 2010.  Dr. Alinda Money   History of renal cell carcinoma   Morbid obesity ([place])   Essential hypertension   OSA (obstructive sleep apnea)   Hypothyroidism   CAD (coronary artery disease)   Hyponatremia   Acute on chronic respiratory failure with hypoxia and hypercapnia (HCC)  Acute on chronic hypoxic respiratory failure due to covid-19 pneumonia, moderate persistent asthma, OSA, history of PE: The multitude and severity of comorbidities make  this patient high risk for decompensation and poor outcome. Last year he required life support in the setting of pneumonia. Note RV function and pulmonary artery size by echo of limited quality were grossly normal - Continue supplemental oxygen as needed. Continues to have improving exercise tolerance but significant subjective dyspnea. Per PT, ambulated 13ft x3 without SpO2 below 90% on room air.   - Has completed remdesivir (10/22 - 10/26) - Continue steroids x10 days (10/22 - 10/31). CRP shows improvement. Would recheck CBC at follow up after completing steroids to check leukocytosis.  - Airborne/contact precautions - Encourage proning, incentive spirometry, OOB as much as possible. Strongly encouraged these to continue after discharge. - Antitussives prn - Vitamin C, zinc while admitted - Inhalers scheduled and prn, singulair. No wheezing to suggest asthma exacerbation. - Continue apixaban  - Will benefit from returning home to get NIPPV qHS. Need to avoid exposure to others in the room where this is occurring due to aerosolization.   Deconditioning: Due to covid-19 infection. Suspect this is playing a significant role in symptom burden. - Continue PT and OT at home when discharged.   Acute on chronic HFpEF:  - Continue home lasix 40mg  po BID  AKI on stage IIIa CKD: Has stabilized.  - Monitor metabolic panel - Avoid nephrotoxins  LFT elevation: Mild, roughly stable. Suspected to be due to covid-19 infection. - Continue monitoring LFTs at follow up. ALT 62 at last check.  HTN:  - Continue home propranolol (also for ET)  Paroxysmal atrial fibrillation s/p ablation 2019: NSR on ECG - Propranolol, eliquis as above.   Morbid obesity: Noted  Hypothyroidism: Last TSH 0.45 in Dec 2019. - Continue synthroid   Depression, anxiety:  -  Continue home medications including SSRI, nortriptyline, buspar, trazodone, and prn lorazepam.  - Anxiety felt to be a significant contributor  to symptoms now that hypoxia has resolved and dyspnea remains chief concern.   Discharge Instructions Discharge Instructions    Diet - low sodium heart healthy   Complete by: As directed    Discharge instructions   Complete by: As directed    You are being discharged from the hospital after treatment for covid-19 infection. You are felt to be stable enough to no longer require inpatient monitoring, testing, and treatment, though you will need to follow the recommendations below: - Continue taking decadron for one additional day. This pill was sent to your pharmacy for pick up to be taken tomorrow. You have otherwise completed therapy for covid with sustained improvement in your labs and level of oxygen. - Remain in self-isolation for 14 days following discharge to reduce risk of transmission of the virus.  - Do not take NSAID medications (including, but not limited to, ibuprofen, advil, motrin, naproxen, aleve, goody's powder, etc.) - Follow up with your doctor in the next week via telehealth or seek medical attention right away if your symptoms get WORSE.  - Consider donating plasma after you have recovered (either 14 days after a negative test or 28 days after symptoms have completely resolved) because your antibodies to this virus may be helpful to give to others with life-threatening infections. Please go to the website www.[email or web address] if you would like to consider volunteering for plasma donation.    Directions for you at home:  Wear a facemask You should wear a facemask that covers your nose and mouth when you are in the same room with other people and when you visit a healthcare provider. People who live with or visit you should also wear a facemask while they are in the same room with you.  Separate yourself from other people in your home As much as possible, you should stay in a different room from other people in your home. Also, you should use a separate bathroom, if  available.  Avoid sharing household items You should not share dishes, drinking glasses, cups, eating utensils, towels, bedding, or other items with other people in your home. After using these items, you should wash them thoroughly with soap and water.  Cover your coughs and sneezes Cover your mouth and nose with a tissue when you cough or sneeze, or you can cough or sneeze into your sleeve. Throw used tissues in a lined trash can, and immediately wash your hands with soap and water for at least 20 seconds or use an alcohol-based hand rub.  Wash your [facility] your hands often and thoroughly with soap and water for at least 20 seconds. You can use an alcohol-based hand sanitizer if soap and water are not available and if your hands are not visibly dirty. Avoid touching your eyes, nose, and mouth with unwashed hands.  Directions for those who live with, or provide care at home for you:  Limit the number of people who have contact with the patient If possible, have only one caregiver for the patient. Other household members should stay in another home or place of residence. If this is not possible, they should stay in another room, or be separated from the patient as much as possible. Use a separate bathroom, if available. Restrict visitors who do not have an essential need to be in the home.  Ensure good ventilation Make sure that shared spaces  in the home have good air flow, such as from an air conditioner or an opened window, weather permitting.  Wash your hands often Wash your hands often and thoroughly with soap and water for at least 20 seconds. You can use an alcohol based hand sanitizer if soap and water are not available and if your hands are not visibly dirty. Avoid touching your eyes, nose, and mouth with unwashed hands. Use disposable paper towels to dry your hands. If not available, use dedicated cloth towels and replace them when they become wet.  Wear a facemask and  gloves Wear a disposable facemask at all times in the room and gloves when you touch or have contact with the patient's blood, body fluids, and/or secretions or excretions, such as sweat, saliva, sputum, nasal mucus, vomit, urine, or feces.  Ensure the mask fits over your nose and mouth tightly, and do not touch it during use. Throw out disposable facemasks and gloves after using them. Do not reuse. Wash your hands immediately after removing your facemask and gloves. If your personal clothing becomes contaminated, carefully remove clothing and launder. Wash your hands after handling contaminated clothing. Place all used disposable facemasks, gloves, and other waste in a lined container before disposing them with other household waste. Remove gloves and wash your hands immediately after handling these items.  Do not share dishes, glasses, or other household items with the patient Avoid sharing household items. You should not share dishes, drinking glasses, cups, eating utensils, towels, bedding, or other items with a patient who is confirmed to have, or being evaluated for, COVID-19 infection. After the person uses these items, you should wash them thoroughly with soap and water.  Wash laundry thoroughly Immediately remove and wash clothes or bedding that have blood, body fluids, and/or secretions or excretions, such as sweat, saliva, sputum, nasal mucus, vomit, urine, or feces, on them. Wear gloves when handling laundry from the patient. Read and follow directions on labels of laundry or clothing items and detergent. In general, wash and dry with the warmest temperatures recommended on the label.  Clean all areas the individual has used often Clean all touchable surfaces, such as counters, tabletops, doorknobs, bathroom fixtures, toilets, phones, keyboards, tablets, and bedside tables, every day. Also, clean any surfaces that may have blood, body fluids, and/or secretions or excretions on  them. Wear gloves when cleaning surfaces the patient has come in contact with. Use a diluted bleach solution (e.g., dilute bleach with 1 part bleach and 10 parts water) or a household disinfectant with a label that says EPA-registered for coronaviruses. To make a bleach solution at home, add 1 tablespoon of bleach to 1 quart (4 cups) of water. For a larger supply, add  cup of bleach to 1 gallon (16 cups) of water. Read labels of cleaning products and follow recommendations provided on product labels. Labels contain instructions for safe and effective use of the cleaning product including precautions you should take when applying the product, such as wearing gloves or eye protection and making sure you have good ventilation during use of the product. Remove gloves and wash hands immediately after cleaning.  Monitor yourself for signs and symptoms of illness Caregivers and household members are considered close contacts, should monitor their health, and will be asked to limit movement outside of the home to the extent possible. Follow the monitoring steps for close contacts listed on the symptom monitoring form.  If you have additional questions, contact your local health department or call  the epidemiologist on call at [phone] (available 24/7). This guidance is subject to change. For the most up-to-date guidance from [facility], please refer to their website: YouBlogs.pl   Increase activity slowly   Complete by: As directed      Allergies as of 04/28/2019      Reactions   Ambien [zolpidem] Anxiety   Anxiety and agitation when tried as sleeper in hospital aug 2019      Medication List    STOP taking these medications   azithromycin 250 MG tablet Commonly known as: Zithromax Z-Pak   predniSONE 50 MG tablet Commonly known as: DELTASONE     TAKE these medications   AeroChamber MV inhaler Use as instructed   albuterol 108  (90 Base) MCG/ACT inhaler Commonly known as: VENTOLIN HFA Inhale 2 puffs into the lungs every 6 (six) hours as needed for wheezing or shortness of breath.   budesonide-formoterol 160-4.5 MCG/ACT inhaler Commonly known as: Symbicort Inhale 2 puffs into the lungs 2 (two) times daily.   budesonide-formoterol 160-4.5 MCG/ACT inhaler Commonly known as: Symbicort Inhale 2 puffs into the lungs 2 (two) times daily.   busPIRone 5 MG tablet Commonly known as: BUSPAR TAKE ONE TABLET BY MOUTH TWICE A DAY AS NEEDED ANXIETY What changed: See the new instructions.   colchicine 0.6 MG tablet Take 1 tablet (0.6 mg total) by mouth 2 (two) times daily.   cyclobenzaprine 10 MG tablet Commonly known as: FLEXERIL TAKE ONE TABLET BY MOUTH THREE TIMES A DAY AS NEEDED FOR MUSCLE SPASMS What changed: See the new instructions.   dexamethasone 6 MG tablet Commonly known as: Decadron Take 1 tablet (6 mg total) by mouth daily for 1 day. on 10/31. Start taking on: April 29, 2019   Eliquis 5 MG Tabs tablet Generic drug: apixaban TAKE ONE TABLET BY MOUTH TWICE A DAY What changed: how much to take   escitalopram 20 MG tablet Commonly known as: LEXAPRO TAKE ONE TABLET BY MOUTH DAILY   furosemide 40 MG tablet Commonly known as: Lasix Take 1-2 tablets (40-80 mg total) by mouth 2 (two) times daily.   gabapentin 100 MG capsule Commonly known as: NEURONTIN Take 3 capsules (300 mg total) by mouth at bedtime.   Klor-Con M20 20 MEQ tablet Generic drug: potassium chloride SA TAKE ONE TABLET BY MOUTH TWICE A DAY What changed: how much to take   levothyroxine 150 MCG tablet Commonly known as: SYNTHROID TAKE ONE TABLET BY MOUTH DAILY What changed: when to take this   montelukast 10 MG tablet Commonly known as: SINGULAIR Take 1 tablet (10 mg total) by mouth daily.   multivitamins ther. w/minerals Tabs tablet Take 1 tablet by mouth daily.   nortriptyline 75 MG capsule Commonly known as:  PAMELOR TAKE ONE CAPSULE BY MOUTH AT BEDTIME   propranolol 10 MG tablet Commonly known as: INDERAL TAKE ONE TABLET BY MOUTH TWICE A DAY   traMADol 50 MG tablet Commonly known as: ULTRAM TAKE ONE TABLET BY MOUTH EVERY 6 HOURS AS NEEDED FOR MODERATE PAIN What changed: See the new instructions.   traZODone 50 MG tablet Commonly known as: DESYREL TAKE ONE-HALF TO 1 TABLET BY MOUTH AT BEDTIME AS NEEDED FOR SLEEP What changed:   how much to take  how to take this  when to take this  reasons to take this  additional instructions      Follow-up Information    Marin Olp, MD. Schedule an appointment as soon as possible for a visit.  Specialty: Family Medicine Contact information: [place] [address] [phone]        Jettie Booze, MD .   Specialties: Cardiology, Radiology, Interventional Cardiology Contact information: [id number] [place] [address] [phone]          Allergies  Allergen Reactions  . Ambien [Zolpidem] Anxiety    Anxiety and agitation when tried as sleeper in hospital aug 2019    Consultations:  None  Procedures/Studies: Dg Chest 1 View  Result Date: 04/25/2019 CLINICAL DATA:  Dyspnea EXAM: CHEST  1 VIEW COMPARISON:  04/22/2019 FINDINGS: Cardiac shadow remains prominent. Aortic calcifications are seen. Patchy left basilar infiltrate with associated small effusion is noted. This has increased slightly in the interval from the prior exam. The right lung is clear. No acute bony abnormality is noted. IMPRESSION: Left basilar infiltrate with small associated effusion. Electronically Signed   By: Inez Catalina M.D.   On: 04/25/2019 19:07   Dg Chest 1 View  Result Date: 04/23/2019 CLINICAL DATA:  Cough and shortness of breath.  COVID-19 positive EXAM: CHEST  1 VIEW COMPARISON:  April 20, 2019. FINDINGS: There is ill-defined opacity in the right upper lobe with less consolidation  in this area compared to recent study. There is atelectatic change in the left base with small left pleural effusion. Heart is upper normal in size with pulmonary vascularity normal. There is aortic atherosclerosis. There is postoperative change in the mid cervical spine. There is degenerative change in the thoracic spine. IMPRESSION: Less opacity in the right upper lobe compared to recent study. Ill-defined patchy opacity remains in this area. There is mild bibasilar atelectasis with small left pleural effusion. Stable cardiac prominence. Aortic Atherosclerosis (ICD10-I70.0). Electronically Signed   By: Lowella Grip III M.D.   On: 04/23/2019 10:23   Dg Chest Portable 1 View  Result Date: 04/20/2019 CLINICAL DATA:  Shortness of breath, history of recent COVID-19 positivity EXAM: PORTABLE CHEST 1 VIEW COMPARISON:  03/15/2019 FINDINGS: Cardiac shadow is prominent but accentuated by the portable technique. Aortic calcifications are again seen. Patchy parenchymal opacity is noted most prominent in the right upper lobe. No bony abnormality is seen. IMPRESSION: Patchy infiltrate most prominent in the right upper lobe. Electronically Signed   By: Inez Catalina M.D.   On: 04/20/2019 14:27      Subjective: Still significant dyspnea on exertion but moving better with PT than before. No chest pain.   Discharge Exam: Vitals:   04/27/19 1924 04/28/19 0759  BP: 131/72 (!) 153/87  Pulse: 98 85  Resp: 16   Temp: 97.9 F (36.6 C) 98.9 F (37.2 C)  SpO2: 97% 96%   General: Pt is alert, awake, not in acute distress Cardiovascular: RRR, S1/S2 +, no rubs, no gallops Respiratory: Distant without crackles or wheezes Abdominal: Soft, NT, ND, bowel sounds + Extremities: Trace woody edema, no cyanosis  Labs: BNP (last 3 results) Recent Labs    04/20/19 1915  BNP [id number]   Basic Metabolic Panel: Recent Labs  Lab 04/23/19 0411 04/24/19 0250 04/25/19 0435 04/26/19 0430 04/27/19 0355  NA 134* 136 138  137 135  K 4.7 4.5 4.4 4.3 4.4  CL 100 97* 98 95* 96*  CO2 24 29 32 31 28  GLUCOSE 151* 108* 104* 119* 138*  BUN 29* 27* 27* 32* 32*  CREATININE 1.19 1.15 1.13 1.12 1.11  CALCIUM 8.2* 8.3* 8.6* 8.6* 8.8*   Liver Function Tests: Recent Labs  Lab 04/23/19 0411  04/24/19 0250 04/25/19 0435 04/26/19 0430 04/27/19 0355  AST 42* 43* 40 52* 47*  ALT 27 41 48* 57* 62*  ALKPHOS 61 71 69 68 72  BILITOT 1.2 0.3 0.5 0.4 0.4  PROT 6.1* 6.4* 6.2* 6.4* 6.7  ALBUMIN 2.7* 2.9* 2.8* 2.8* 3.1*   No results for input(s): LIPASE, AMYLASE in the last 168 hours. No results for input(s): AMMONIA in the last 168 hours. CBC: Recent Labs  Lab 04/24/19 0250 04/27/19 0355  WBC 12.7* 17.0*  NEUTROABS 9.7* 12.8*  HGB 16.3 16.5  HCT 50.0 51.0  MCV 95.2 94.4  PLT 359 473*   Cardiac Enzymes: No results for input(s): CKTOTAL, CKMB, CKMBINDEX, TROPONINI in the last 168 hours. BNP: Invalid input(s): POCBNP CBG: No results for input(s): GLUCAP in the last 168 hours. D-Dimer Recent Labs    04/26/19 0430  DDIMER 0.94*   Hgb A1c No results for input(s): HGBA1C in the last 72 hours. Lipid Profile No results for input(s): CHOL, HDL, LDLCALC, TRIG, CHOLHDL, LDLDIRECT in the last 72 hours. Thyroid function studies No results for input(s): TSH, T4TOTAL, T3FREE, THYROIDAB in the last 72 hours.  Invalid input(s): FREET3 Anemia work up Recent Labs    04/26/19 0430  FERRITIN 312   Urinalysis    Component Value Date/Time   COLORURINE AMBER (A) 04/20/2019 1438   APPEARANCEUR CLEAR 04/20/2019 1438   LABSPEC 1.017 04/20/2019 1438   PHURINE 5.0 04/20/2019 1438   GLUCOSEU NEGATIVE 04/20/2019 1438   HGBUR MODERATE (A) 04/20/2019 1438   HGBUR 2+ 09/22/2007 1444   BILIRUBINUR NEGATIVE 04/20/2019 1438   BILIRUBINUR n 11/07/2015 1417   KETONESUR NEGATIVE 04/20/2019 1438   PROTEINUR NEGATIVE 04/20/2019 1438   UROBILINOGEN 1.0 11/07/2015 1417   UROBILINOGEN 1.0 05/22/2011 1454   NITRITE NEGATIVE  04/20/2019 [place] 04/20/2019 1438    Microbiology Recent Results (from the past 240 hour(s))  Urine culture     Status: None   Collection Time: 04/20/19  2:38 PM   Specimen: In/Out Cath Urine  Result Value Ref Range Status   Specimen Description   Final    IN/OUT CATH URINE Performed at [facility], [place] [address], [place], [place] [address]    Special Requests   Final    NONE Performed at [facility], [place] [address], [place], [place] [address]    Culture   Final    NO GROWTH Performed at [facility], [place] [address] [place], [place] [address]    Report Status 04/22/2019 FINAL  Final  Blood culture (routine x 2)     Status: None   Collection Time: 04/20/19  3:56 PM   Specimen: BLOOD  Result Value Ref Range Status   Specimen Description   Final    BLOOD RIGHT ANTECUBITAL Performed at [facility] [address], [place], [place] [address]    Special Requests   Final    BOTTLES DRAWN AEROBIC AND ANAEROBIC Blood Culture results may not be optimal due to an inadequate volume of blood received in culture bottles Performed at [facility] [address], [place], [place] [address]    Culture   Final    NO GROWTH 5 DAYS Performed at [facility], [place] [address], [place],  [address]    Report Status 04/25/2019 FINAL  Final  SARS Coronavirus 2 by RT PCR (hospital order, performed in [facility] hospital lab) Nasopharyngeal Nasopharyngeal Swab  Status: Abnormal   Collection Time: 04/20/19  4:01 PM   Specimen: Nasopharyngeal Swab  Result Value Ref Range Status   SARS Coronavirus 2 POSITIVE (A) NEGATIVE Final    Comment: RESULT CALLED TO, READ BACK BY AND VERIFIED WITH: HALL,C. RN @1846  ON 10.22.2020 BY COHEN,K (NOTE) If result is NEGATIVE SARS-CoV-2 target nucleic acids are NOT DETECTED. The SARS-CoV-2 RNA is generally detectable in upper and lower   respiratory specimens during the acute phase of infection. The lowest  concentration of SARS-CoV-2 viral copies this assay can detect is 250  copies / mL. A negative result does not preclude SARS-CoV-2 infection  and should not be used as the sole basis for treatment or other  patient management decisions.  A negative result may occur with  improper specimen collection / handling, submission of specimen other  than nasopharyngeal swab, presence of viral mutation(s) within the  areas targeted by this assay, and inadequate number of viral copies  (<250 copies / mL). A negative result must be combined with clinical  observations, patient history, and epidemiological information. If result is POSITIVE SARS-CoV-2 target nucleic acids are DETECT ED. The SARS-CoV-2 RNA is generally detectable in upper and lower  respiratory specimens during the acute phase of infection.  Positive  results are indicative of active infection with SARS-CoV-2.  Clinical  correlation with patient history and other diagnostic information is  necessary to determine patient infection status.  Positive results do  not rule out bacterial infection or co-infection with other viruses. If result is PRESUMPTIVE POSTIVE SARS-CoV-2 nucleic acids MAY BE PRESENT.   A presumptive positive result was obtained on the submitted specimen  and confirmed on repeat testing.  While 2019 novel coronavirus  (SARS-CoV-2) nucleic acids may be present in the submitted sample  additional confirmatory testing may be necessary for epidemiological  and / or clinical management purposes  to differentiate between  SARS-CoV-2 and other Sarbecovirus currently known to infect humans.  If clinically indicated additional testing with an alternate test  methodology ([id number] [id number]) is advised. The SARS-CoV-2 RNA is generally  detectable in upper and lower respiratory specimens during the acute  phase of infection. The expected result is Negative. Fact  Sheet for Patients:  StrictlyIdeas.no Fact Sheet for Healthcare Providers: BankingDealers.co.za This test is not yet approved or cleared by the [place] FDA and has been authorized for detection and/or diagnosis of SARS-CoV-2 by FDA under an Emergency Use Authorization (EUA).  This EUA will remain in effect (meaning this test can be used) for the duration of the COVID-19 declaration under Section 564(b)(1) of the Act, 21 U.S.C. section 360bbb-3(b)(1), unless the authorization is terminated or revoked sooner. Performed at [facility], [place] [address], [place], [place] [address]   Blood culture (routine x 2)     Status: None   Collection Time: 04/20/19  4:01 PM   Specimen: BLOOD LEFT HAND  Result Value Ref Range Status   Specimen Description   Final    BLOOD LEFT HAND Performed at [facility] [address] [place], [place] [address]    Special Requests   Final    BOTTLES DRAWN AEROBIC AND ANAEROBIC Blood Culture adequate volume Performed at [facility] [address] [place], [place] [address]    Culture   Final    NO GROWTH 5 DAYS Performed at [facility], [place] [address] [address] [place],  [address]    Report Status 04/25/2019 FINAL  Final  Time coordinating discharge: Approximately 40 minutes  Patrecia Pour, MD  [facility] 04/28/2019, 10:13 AM

## 2019-05-02 ENCOUNTER — Telehealth: Payer: Self-pay

## 2019-05-02 NOTE — Telephone Encounter (Signed)
Transition Care Management Follow-up Telephone Call  Date of discharge and from where: 04/28/19 from [facility]   How have you been since you were released from the hospital? Stable; some complaints of bilateral foot pain and edema  Any questions or concerns? See above; also requesting refill of Buspirone and rx for pain medication for above problem   Items Reviewed:  Did the pt receive and understand the discharge instructions provided? Yes   Medications obtained and verified? Yes   Any new allergies since your discharge? No   Dietary orders reviewed? Yes  Do you have support at home? Yes   Other (ie: DME, Home Health, etc) Services in place with [facility]: (I = Independent and D = Dependent) ADL's: I  Bathing/Dressing- I   Meal Prep- D  Eating- I  Maintaining continence- I  Transferring/Ambulation- D; limited due to respiratory status   Managing Meds- I   Follow up appointments reviewed:    PCP Hospital f/u appt confirmed? Yes  Scheduled to see Dr. Yong Channel on 05/05/19 @ 1:40 (televisit)   [facility] f/u appt confirmed? Yes  Scheduled to see Pulmonary on December 2020.  Are transportation arrangements needed? No   If their condition worsens, is the pt aware to call  their PCP or go to the ED? Yes  Was the patient provided with contact information for the PCP's office or ED? Yes  Was the pt encouraged to call back with questions or concerns? Yes

## 2019-05-02 NOTE — Telephone Encounter (Signed)
Noted thanks °

## 2019-05-02 NOTE — Telephone Encounter (Signed)
Called patient to follow up from recent hospitalization.  No answer received and no availability to leave voicemail.  Will try again.

## 2019-05-03 DIAGNOSIS — I2609 Other pulmonary embolism with acute cor pulmonale: Secondary | ICD-10-CM | POA: Diagnosis not present

## 2019-05-03 DIAGNOSIS — I2510 Atherosclerotic heart disease of native coronary artery without angina pectoris: Secondary | ICD-10-CM | POA: Diagnosis not present

## 2019-05-03 DIAGNOSIS — J1289 Other viral pneumonia: Secondary | ICD-10-CM | POA: Diagnosis not present

## 2019-05-03 DIAGNOSIS — J9622 Acute and chronic respiratory failure with hypercapnia: Secondary | ICD-10-CM | POA: Diagnosis not present

## 2019-05-03 DIAGNOSIS — U071 COVID-19: Secondary | ICD-10-CM | POA: Diagnosis not present

## 2019-05-04 ENCOUNTER — Other Ambulatory Visit: Payer: Self-pay

## 2019-05-04 ENCOUNTER — Telehealth: Payer: Self-pay | Admitting: Family Medicine

## 2019-05-04 DIAGNOSIS — U071 COVID-19: Secondary | ICD-10-CM | POA: Diagnosis not present

## 2019-05-04 DIAGNOSIS — I2609 Other pulmonary embolism with acute cor pulmonale: Secondary | ICD-10-CM | POA: Diagnosis not present

## 2019-05-04 DIAGNOSIS — I2510 Atherosclerotic heart disease of native coronary artery without angina pectoris: Secondary | ICD-10-CM | POA: Diagnosis not present

## 2019-05-04 DIAGNOSIS — J9622 Acute and chronic respiratory failure with hypercapnia: Secondary | ICD-10-CM | POA: Diagnosis not present

## 2019-05-04 DIAGNOSIS — J1289 Other viral pneumonia: Secondary | ICD-10-CM | POA: Diagnosis not present

## 2019-05-04 MED ORDER — BUSPIRONE HCL 5 MG PO TABS: ORAL_TABLET | ORAL | 4 refills | Status: DC

## 2019-05-04 NOTE — Telephone Encounter (Signed)
See below

## 2019-05-04 NOTE — Telephone Encounter (Signed)
See note

## 2019-05-04 NOTE — Telephone Encounter (Signed)
I think as long as his oxygen does not dipped below 92% that should be okay-I am okay with verbal orders

## 2019-05-04 NOTE — Progress Notes (Signed)
Patient in need of refill post hospital discharge

## 2019-05-04 NOTE — Telephone Encounter (Signed)
Denise with [facility] is calling in for verbal orders to work with pt.   Frequency: 1 week 3  She said that pt isn't wearing his oxygen consistently pt's oxygen level is 92 % on room air. She would like to know if PCP is okay with this?   CB: [phone]

## 2019-05-05 ENCOUNTER — Ambulatory Visit (INDEPENDENT_AMBULATORY_CARE_PROVIDER_SITE_OTHER): Payer: Medicare Other | Admitting: Family Medicine

## 2019-05-05 VITALS — BP 97/84 | HR 84 | Wt 294.0 lb

## 2019-05-05 DIAGNOSIS — I5032 Chronic diastolic (congestive) heart failure: Secondary | ICD-10-CM | POA: Diagnosis not present

## 2019-05-05 DIAGNOSIS — J9621 Acute and chronic respiratory failure with hypoxia: Secondary | ICD-10-CM

## 2019-05-05 DIAGNOSIS — I2510 Atherosclerotic heart disease of native coronary artery without angina pectoris: Secondary | ICD-10-CM | POA: Diagnosis not present

## 2019-05-05 DIAGNOSIS — I2782 Chronic pulmonary embolism: Secondary | ICD-10-CM

## 2019-05-05 DIAGNOSIS — U071 COVID-19: Secondary | ICD-10-CM

## 2019-05-05 DIAGNOSIS — I10 Essential (primary) hypertension: Secondary | ICD-10-CM

## 2019-05-05 DIAGNOSIS — G4733 Obstructive sleep apnea (adult) (pediatric): Secondary | ICD-10-CM | POA: Diagnosis not present

## 2019-05-05 DIAGNOSIS — I2609 Other pulmonary embolism with acute cor pulmonale: Secondary | ICD-10-CM | POA: Diagnosis not present

## 2019-05-05 DIAGNOSIS — J9622 Acute and chronic respiratory failure with hypercapnia: Secondary | ICD-10-CM | POA: Diagnosis not present

## 2019-05-05 DIAGNOSIS — I4892 Unspecified atrial flutter: Secondary | ICD-10-CM

## 2019-05-05 DIAGNOSIS — F411 Generalized anxiety disorder: Secondary | ICD-10-CM

## 2019-05-05 DIAGNOSIS — J1289 Other viral pneumonia: Secondary | ICD-10-CM

## 2019-05-05 DIAGNOSIS — J1282 Pneumonia due to coronavirus disease 2019: Secondary | ICD-10-CM

## 2019-05-05 DIAGNOSIS — N183 Chronic kidney disease, stage 3 unspecified: Secondary | ICD-10-CM

## 2019-05-05 DIAGNOSIS — I8290 Acute embolism and thrombosis of unspecified vein: Secondary | ICD-10-CM

## 2019-05-05 DIAGNOSIS — E890 Postprocedural hypothyroidism: Secondary | ICD-10-CM | POA: Diagnosis not present

## 2019-05-05 DIAGNOSIS — J4540 Moderate persistent asthma, uncomplicated: Secondary | ICD-10-CM

## 2019-05-05 MED ORDER — NORTRIPTYLINE HCL 75 MG PO CAPS: 75.0000 mg | ORAL_CAPSULE | Freq: Every day | ORAL | 1 refills | Status: DC

## 2019-05-05 MED ORDER — ESCITALOPRAM OXALATE 20 MG PO TABS: 20.0000 mg | ORAL_TABLET | Freq: Every day | ORAL | 1 refills | Status: DC

## 2019-05-05 MED ORDER — TRAMADOL HCL 50 MG PO TABS: 50.0000 mg | ORAL_TABLET | Freq: Two times a day (BID) | ORAL | 1 refills | Status: DC | PRN

## 2019-05-05 MED ORDER — GABAPENTIN 100 MG PO CAPS: 300.0000 mg | ORAL_CAPSULE | Freq: Every day | ORAL | 5 refills | Status: DC

## 2019-05-05 NOTE — Telephone Encounter (Unsigned)
Copied from [place] [id number]. Topic: General - Other >> May 05, 2019  2:23 PM Leward Quan A wrote: Reason for CRM: Patient wife called to say that the have been in the waiting area of the virtual call since 1.30 PM and no one have come on. She is asking for a call back with an explanation. I called the office and was hung up on. Called several times with no answer. Please call patient at Ph# [phone]

## 2019-05-05 NOTE — Progress Notes (Signed)
Phone [phone] Virtual visit via Video note- TCM [name] is a 78 y.o. year old very pleasant male patient who presents for transitional care management and hospital follow up for COVID-19/hypoxia. Patient was hospitalized from 04/20/2019 to 04/28/2019. A TCM phone call was completed on 05/02/2019 (within 2 business days of discharge date). Medical complexity high    Subjective:  Chief complaint: Chief Complaint  Patient presents with  . virtual covid-19 follow-up  . o2 keeps dropping   This visit type was conducted due to national recommendations for restrictions regarding the COVID-19 Pandemic (e.g. social distancing).  This format is felt to be most appropriate for this patient at this time balancing risks to patient and risks to population by having him in for in person visit.  No physical exam was performed (except for noted visual exam or audio findings with Telehealth visits).    Our team/I connected with Wayne Booth at  1:40 PM EST by a video enabled telemedicine application ([email or web address] or caregility through epic) and verified that I am speaking with the correct person using two identifiers.  Location patient: Home-O2 Location provider: [facility], office Persons participating in the virtual visit:  patient  Our team/I discussed the limitations of evaluation and management by telemedicine and the availability of in person appointments. In light of current covid-19 pandemic, patient also understands that we are trying to protect them by minimizing in office contact if at all possible.  The patient expressed consent for telemedicine visit and agreed to proceed. Patient understands insurance will be billed.    See problem oriented charting as well ROS- no fever/chills. Some cough. Complains of shortness of breath   Past Medical History-  Patient Active Problem List   Diagnosis Date Noted  . Acute on chronic respiratory failure with hypoxia and hypercapnia (HCC) 04/20/2019     Priority: High  . Pneumonia due to COVID-19 virus 04/20/2019    Priority: High  . Memory loss 06/10/2018    Priority: High  . Chronic respiratory failure with hypoxia (HCC) 04/26/2018    Priority: High  . CAD (coronary artery disease) 04/09/2018    Priority: High  . CKD (chronic kidney disease) stage 3, GFR 30-59 ml/min     Priority: High  . (HFpEF) heart failure with preserved ejection fraction ([place]) 12/16/2017    Priority: High  . Chest pain     Priority: High  . Atrial flutter ([place]) 11/01/2017    Priority: High  . Chronic pulmonary embolism ([place]) 10/23/2016    Priority: High  . Asthmatic bronchitis 06/09/2011    Priority: High  . History of prostate cancer-seed implantation 2010.  Dr. Alinda Money 10/02/2008    Priority: High  . History of renal cell carcinoma 12/26/2007    Priority: High  . PTSD (post-traumatic stress disorder) 07/27/2018    Priority: Medium  . HCAP (healthcare-associated pneumonia) 02/03/2018    Priority: Medium  . History of adenomatous polyp of colon 04/12/2017    Priority: Medium  . Cervical disc disease 11/13/2015    Priority: Medium  . Essential tremor 08/14/2015    Priority: Medium  . Gout 08/14/2015    Priority: Medium  . Hypothyroidism 07/12/2014    Priority: Medium  . Hyperglycemia 07/12/2014    Priority: Medium  . GAD (generalized anxiety disorder) 11/07/2013    Priority: Medium  . OSA (obstructive sleep apnea) 07/07/2011    Priority: Medium  . Morbid obesity ([place]) 10/02/2008    Priority: Medium  . Insomnia 12/26/2007  Priority: Medium  . Essential hypertension 12/28/2006    Priority: Medium  . Aortic atherosclerosis ([place]) 05/10/2018    Priority: Low  . First degree AV block 04/15/2018    Priority: Low  . Constipation 01/21/2018    Priority: Low  . Anticoagulated     Priority: Low  . Pain in left knee 07/15/2017    Priority: Low  . Bilateral foot pain 07/13/2017    Priority: Low  . Community acquired pneumonia 06/18/2017     Priority: Low  . Status post nephrectomy 06/09/2017    Priority: Low  . VTE (venous thromboembolism) 06/09/2017    Priority: Low  . Actinic keratosis 11/27/2009    Priority: Low  . Osteoarthritis 11/13/2008    Priority: Low  . TESTOSTERONE DEFICIENCY 12/28/2006    Priority: Low  . Hyponatremia 04/15/2018  . Pleural effusion 04/14/2018  . Chronic pain 04/09/2018  . Dyspnea     Medications- reviewed and updated  A medical reconciliation was performed comparing current medicines to hospital discharge medications. Current Outpatient Medications  Medication Sig Dispense Refill  . albuterol (VENTOLIN HFA) 108 (90 Base) MCG/ACT inhaler Inhale 2 puffs into the lungs every 6 (six) hours as needed for wheezing or shortness of breath. 6.7 g 2  . budesonide-formoterol (SYMBICORT) 160-4.5 MCG/ACT inhaler Inhale 2 puffs into the lungs 2 (two) times daily. 1 Inhaler 0  . busPIRone (BUSPAR) 5 MG tablet TAKE ONE TABLET BY MOUTH TWICE A DAY AS NEEDED ANXIETY 30 tablet 4  . cyclobenzaprine (FLEXERIL) 10 MG tablet TAKE ONE TABLET BY MOUTH THREE TIMES A DAY AS NEEDED FOR MUSCLE SPASMS (Patient taking differently: Take 10 mg by mouth 3 (three) times daily as needed for muscle spasms. ) 30 tablet 0  . ELIQUIS 5 MG TABS tablet TAKE ONE TABLET BY MOUTH TWICE A DAY (Patient taking differently: Take 5 mg by mouth 2 (two) times daily. ) 60 tablet 10  . escitalopram (LEXAPRO) 20 MG tablet Take 1 tablet (20 mg total) by mouth daily. 90 tablet 1  . furosemide (LASIX) 40 MG tablet Take 1-2 tablets (40-80 mg total) by mouth 2 (two) times daily. 120 tablet 5  . gabapentin (NEURONTIN) 100 MG capsule Take 3 capsules (300 mg total) by mouth at bedtime. 90 capsule 5  . KLOR-CON M20 20 MEQ tablet TAKE ONE TABLET BY MOUTH TWICE A DAY (Patient taking differently: Take 20 mEq by mouth 2 (two) times daily. ) 120 tablet 0  . levothyroxine (SYNTHROID) 150 MCG tablet TAKE ONE TABLET BY MOUTH DAILY (Patient taking differently:  Take 150 mcg by mouth daily before breakfast. ) 90 tablet 0  . montelukast (SINGULAIR) 10 MG tablet Take 1 tablet (10 mg total) by mouth daily. 30 tablet 11  . Multiple Vitamins-Minerals (MULTIVITAMINS THER. W/MINERALS) TABS Take 1 tablet by mouth daily.    . nortriptyline (PAMELOR) 75 MG capsule Take 1 capsule (75 mg total) by mouth at bedtime. 90 capsule 1  . propranolol (INDERAL) 10 MG tablet TAKE ONE TABLET BY MOUTH TWICE A DAY (Patient taking differently: Take 10 mg by mouth 2 (two) times daily. ) 120 tablet 0  . Spacer/Aero-Holding Chambers (AEROCHAMBER MV) inhaler Use as instructed 1 each 0  . traMADol (ULTRAM) 50 MG tablet Take 1 tablet (50 mg total) by mouth every 12 (twelve) hours as needed for moderate pain. 30 tablet 1  . traZODone (DESYREL) 50 MG tablet TAKE ONE-HALF TO 1 TABLET BY MOUTH AT BEDTIME AS NEEDED FOR SLEEP (Patient taking differently:  Take 25-50 mg by mouth at bedtime as needed for sleep. ) 90 tablet 3   No current facility-administered medications for this visit.      Objective:  BP 97/84   Pulse 84   Wt 294 lb (133.4 kg)   SpO2 92%   BMI 41.00 kg/m  self reported vitals without oxygen.  Gen: NAD, appears fatigued, not wearing oxygen External ears and nose normal Lungs: nonlabored, normal respiratory rate  Skin: appears dry, no obvious rash Resting in chair with feet down- feet appear swollen bilaterally- encouraged elevation  I independently reviewed the following 1.  Chest x-ray 04/20/2019-portable films.  Patient with patchy bilateral opacities most prominent in the right upper lobe and left lower lung.  No obvious bony fractures or abnormalities.  Calcium on aorta is noted.  Cardiomegaly noted. 2.  Chest x-ray on 04/25/2019-patchy opacit--now most present in left basilar area with slight effusion noted.  Right lung field appears clear.  Cardiomegaly noted.  No obvious fractures.  Calcified aorta is noted.     Assessment and Plan   #COVID-19  hospitalization with pneumonia/hypoxia S: Hospital records reviewed and summarized below. Patient with prolonged hospitalization due to COVID-19.  At baseline patient has intermittent oxygen dependence in the evening and with exertion as well as history of PE on long-term anticoagulant for that but also atrial flutter, asthma, CAD.  Patient presented to the hospital with increased oxygen requirement up to 4 L and was confirmed to have positive COVID-19 SARS-Cov-2 testing.  Initial chest x-ray showed bilateral infiltrates.  Patient was treated with both remdesivir and steroids.  Patient did later develop pulmonary edema in relation to his chronic heart failure and required IV Lasix.  He completed remdesivir course and 9 days of steroids.  CRP levels improved during hospitalization.  No obvious evidence of asthma exacerbation patient did require PT and OT and some additional days of hospitalization to help improve his strength as wife also had COVID-19 and was not at full strength to help him.  Patient was still deconditioned at discharge and recommended to have PT and OT at home.  Patient was having improving exercise tolerance at discharge but significant subjective dyspnea still.    At home-reportsShort of breath with minimal activity such as walking to bathroom in home if he is not close to the bathroom.  He is also having some right-sided rib pain if he coughs.  His main concern is swelling and pain in his feet-does not report erythema  For chronic conditions 1.  Atrial flutter and history of pulmonary embolism-patient was continued on Eliquis while hospitalized.  Also on propranolol for rate control 2.  Acute on chronic heart failure with preserved ejection fraction-patient was sent home on Lasix 40 mg twice a day after IV diuresis.  Patient reports edema in his feet since leaving the hospital and weight gain-the distention of his feet is quite painful and request pain control.  Denies falls or injury.   Worse with walking on his feet.  Encourage leg elevation 3.  Stage III chronic kidney disease-some acute worsening/AKI while hospitalized but improved upon discharge.  We will continue to avoid nephrotoxic agents.  We will try to repeat labs at home 4.  Mild LFT elevation-likely related to COVID-19-we will repeat it LFTs when nursing goes to the home Lab Results  Component Value Date   ALT 62 (H) 04/27/2019   AST 47 (H) 04/27/2019   ALKPHOS 72 04/27/2019   BILITOT 0.4 04/27/2019  5.  Hypertension-was maintained on propranolol also for essential tremor 6.  Morbid obesity-likely contributes to deconditioning 7.  Hypothyroidism-maintained on Synthroid.  We will try to check a TSH with normal labs 8.  Depression/anxiety-patient was continued on SSRI, nortriptyline, buspirone, trazodone, lorazepam.  Anxiety thought to be a big part of his symptoms.  Today we did review risk of serotonin syndrome-if has continued issues may need to consider psychiatry consult. 9.  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  No evidence of asthma exacerbation while hospitalized-not taking his Symbicort at home and encouraged him to restart A/P:    Pneumonia due to COVID-19 virus Patient appears to be recovering well from COVID-19 and associated pneumonia.  He was treated with remdesivir and dexamethasone during hospitalization.  Continues to have some shortness of breath.  He feels like he is getting stronger daily-we will continue to work with PT/OT-we will use this visit to sign off for on any home health orders needed.  Patient does have some right-sided rib pain with coughing-discussed likely due to muscular issues with ongoing cough  Acute on chronic respiratory failure with hypoxia and hypercapnia (HCC) Patient's oxygen levels around 92% on room air at rest.  He is using oxygen when he is active at home-encouraged him to continue to do so.  Do not really want his oxygen levels to dip below  92% with any regularity.  He also has oxygen for sleep in the evening-these issues even preceded COVID-19  (HFpEF) heart failure with preserved ejection fraction [facility]) Patient reports increased edema and his feet feel very tight as a result therefore complains of pain in his feet.  He has also had a few pounds of weight gain.  We will increase his Lasix from 40 mg twice a day to 80 mg in the morning and 40 mg in the evening and follow-up next week.  In regards to pain he requests pain medication-he has used tramadol in the past and I told him he could use this if there is some risk of serotonin syndrome which we discussed-encouraged him to use sparingly and hopefully primary treatment will be diuresis.  May need to increase to 80 mg twice a day if not making good progress next week.  We will also try to get potassium level given increased dosage.  Patient states he is very thirsty and is drinking over 60 ounces a day-as trying to diurese will limit fluids to 35 to 40 ounces per day.  Did encourage him to weigh daily-if increasing weight despite increased diuresis should seek care  Atrial flutter [facility]) Ablation August 2019.  On propranolol for rate control.  Also on Eliquis for anticoagulation.  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  His creatinine worsened to 1.75 with AKI from COVID-19 during hospitalization but improved to 1.1 before discharge.  Stable-continue to monitor.  Last few GFR is actually above 60-would only change to CKD stage II if consistently remains in this range over the next year  Essential hypertension We will continue propranolol.   Perhaps overcontrolled today but I do believe we need to increase his Lasix due to  fluid overload-hopefully he can tolerate this blood pressure wise.  At baseline on 40 mg Lasix twice a day.  We will use 80 mg in the morning  and 40 mg in the afternoon until next week to see if symptoms improve as far as edema and pain in his feet from dependent edema  GAD (generalized anxiety disorder) Patient on Lexapro 20 mg trazodone for sleep/insomnia as well as depression and anxiety.  He is also on sparing buspirone.  Also uses tramadol for pain.  Also has nortriptyline for cervical disc issues.  We reviewed serotonin syndrome risks.  I am going to discuss with patient at follow-up but would like to try to reduce his nortriptyline and potentially discontinue buspirone to lower risk of serotonin syndrome.  Asthmatic bronchitis Encourage patient to restart his Symbicort today.  Can continue albuterol as needed   Mild LFT elevation-repeat next week-could be related to COVID-19 also fatty liver is a risk with his morbid obesity  For foot pain-could also refer back to podiatry-has seen in the past with most recent x-rays in July.  [facility] "Team- I ordered cbc, cmp, t3, t4- can you fax these to home health and see if they can draw on Monday or Tuesday of next week pretty please.   Also schedule him In same day slot next Wednesday- for follow up virtual visit"  Recommended follow up: next Wednesday virtual. Hopefully labs next Tuesday when nurse comes out to home.  Future Appointments  Date Time Provider Onset  05/29/2019  3:00 PM Burtis Junes, NP [facility]  06/13/2019  3:00 PM [facility] PFT ROOM [facility] None  06/13/2019  4:00 PM Parrett, Fonnie Mu, NP [facility] None  07/12/2019  2:00 PM Jettie Booze, MD [facility]  07/19/2019 10:30 AM [facility] NURSE [facility]    Lab/Order associations:   ICD-10-CM   1. COVID-19  U07.1   2. Pneumonia due to COVID-19 virus  U07.1    J12.89   3. Acute on chronic respiratory  failure with hypoxia and hypercapnia (HCC)  J96.21    J96.22   4. Chronic heart failure with preserved ejection fraction (HCC)  I50.32 CBC with Differential future    CMET future  5. Other chronic pulmonary embolism without acute cor pulmonale (HCC)  I27.82   6. Postoperative hypothyroidism  E89.0 T4, free    T3, free  7. Atrial flutter, unspecified type ([place])  I48.92   8. VTE (venous thromboembolism)  I82.90   9. Stage 3 chronic kidney disease, unspecified whether stage 3a or 3b CKD  N18.30   10. Essential hypertension  I10   11. GAD (generalized anxiety disorder)  F41.1   12. Moderate persistent asthmatic bronchitis without complication  123456     Meds ordered this encounter  Medications  . escitalopram (LEXAPRO) 20 MG tablet    Sig: Take 1 tablet (20 mg total) by mouth daily.    Dispense:  90 tablet    Refill:  1  . nortriptyline (PAMELOR) 75 MG capsule    Sig: Take 1 capsule (75 mg total) by mouth at bedtime.    Dispense:  90 capsule    Refill:  1  . traMADol (ULTRAM) 50 MG tablet    Sig: Take 1 tablet (50 mg total) by mouth every 12 (twelve) hours as needed for moderate pain.    Dispense:  30 tablet    Refill:  1  . gabapentin (NEURONTIN) 100 MG capsule    Sig: Take 3 capsules (300 mg total) by mouth at bedtime.    Dispense:  90 capsule    Refill:  5    Return precautions advised.  [name], MD

## 2019-05-05 NOTE — Telephone Encounter (Signed)
Called and spoke with Langley Gauss and gave her VO.

## 2019-05-05 NOTE — Patient Instructions (Signed)
There are no preventive care reminders to display for this patient. Depression screen [facility] 2/9 04/14/2019 11/17/2018 07/20/2018  Decreased Interest 0 0 0  Down, Depressed, Hopeless 0 0 0  PHQ - 2 Score 0 0 0  Altered sleeping - 1 1  Tired, decreased energy - 0 3  Change in appetite - 0 0  Feeling bad or failure about yourself  - 0 0  Trouble concentrating - 0 0  Moving slowly or fidgety/restless - 0 1  Suicidal thoughts - 0 0  PHQ-9 Score - 1 5  Difficult doing work/chores - Not difficult at all Not difficult at all  Some recent data might be hidden

## 2019-05-06 ENCOUNTER — Encounter: Payer: Self-pay | Admitting: Family Medicine

## 2019-05-06 NOTE — Assessment & Plan Note (Signed)
Encourage patient to restart his Symbicort today.  Can continue albuterol as needed

## 2019-05-06 NOTE — Assessment & Plan Note (Addendum)
Patient appears to be recovering well from COVID-19 and associated pneumonia.  He was treated with remdesivir and dexamethasone during hospitalization.  Continues to have some shortness of breath.  He feels like he is getting stronger daily-we will continue to work with PT/OT-we will use this visit to sign off for on any home health orders needed.  Patient does have some right-sided rib pain with coughing-discussed likely due to muscular issues with ongoing cough

## 2019-05-06 NOTE — Assessment & Plan Note (Signed)
Ablation August 2019.  On propranolol for rate control.  Also on Eliquis for anticoagulation.  Stable-continue current medications

## 2019-05-06 NOTE — Assessment & Plan Note (Addendum)
Patient reports increased edema and his feet feel very tight as a result therefore complains of pain in his feet.  He has also had a few pounds of weight gain.  We will increase his Lasix from 40 mg twice a day to 80 mg in the morning and 40 mg in the evening and follow-up next week.  In regards to pain he requests pain medication-he has used tramadol in the past and I told him he could use this if there is some risk of serotonin syndrome which we discussed-encouraged him to use sparingly and hopefully primary treatment will be diuresis.  May need to increase to 80 mg twice a day if not making good progress next week.  We will also try to get potassium level given increased dosage.  Patient states he is very thirsty and is drinking over 60 ounces a day-as trying to diurese will limit fluids to 35 to 40 ounces per day.  Did encourage him to weigh daily-if increasing weight despite increased diuresis should seek care

## 2019-05-06 NOTE — Assessment & Plan Note (Signed)
Patient actually has excellent function considering unilateral kidney.  His creatinine worsened to 1.75 with AKI from COVID-19 during hospitalization but improved to 1.1 before discharge.  Stable-continue to monitor.  Last few GFR is actually above 60-would only change to CKD stage II if consistently remains in this range over the next year

## 2019-05-06 NOTE — Assessment & Plan Note (Addendum)
We will continue propranolol.   Perhaps overcontrolled today but I do believe we need to increase his Lasix due to fluid overload-hopefully he can tolerate this blood pressure wise.  At baseline on 40 mg Lasix twice a day.  We will use 80 mg in the morning and 40 mg in the afternoon until next week to see if symptoms improve as far as edema and pain in his feet from dependent edema

## 2019-05-06 NOTE — Assessment & Plan Note (Signed)
History of pulmonary embolism following hand surgery-remains on long-term Eliquis also due to atrial flutter

## 2019-05-06 NOTE — Assessment & Plan Note (Addendum)
Patient's oxygen levels around 92% on room air at rest.  He is using oxygen when he is active at home-encouraged him to continue to do so.  Do not really want his oxygen levels to dip below 92% with any regularity.  He also has oxygen for sleep in the evening-these issues even preceded COVID-19

## 2019-05-06 NOTE — Assessment & Plan Note (Signed)
Patient on Lexapro 20 mg trazodone for sleep/insomnia as well as depression and anxiety.  He is also on sparing buspirone.  Also uses tramadol for pain.  Also has nortriptyline for cervical disc issues.  We reviewed serotonin syndrome risks.  I am going to discuss with patient at follow-up but would like to try to reduce his nortriptyline and potentially discontinue buspirone to lower risk of serotonin syndrome.

## 2019-05-08 NOTE — Progress Notes (Signed)
Called pt a second time to schedule, No answer, LVM.

## 2019-05-08 NOTE — Progress Notes (Signed)
Can you schedule pt for a virtual visit for this Wednesday please?

## 2019-05-08 NOTE — Progress Notes (Signed)
Called pt to schedule virtual visit this Wednesday, no answer ,LVM.

## 2019-05-08 NOTE — Telephone Encounter (Signed)
Son returning call, stating patient can be reached in at [phone]. Son wanted to note patient is pain.

## 2019-05-08 NOTE — Telephone Encounter (Unsigned)
Copied from [name] [id number]. Topic: General - Other >> May 08, 2019  9:03 AM Rainey Pines A wrote: Patients wife stated that she is returning a call and would like a callback. Didn't not see documentation in chart . Please advise

## 2019-05-08 NOTE — Telephone Encounter (Signed)
I am still primarily concerned about fluid overload-team please increase his Lasix to 80 mg twice daily-this also may help with his feet.  If he is not improving by her follow-up visit then consider x-ray  How are his oxygen levels?  Is he wearing his oxygen?  Also he needs to be scheduled for his Wednesday same-day follow-up for virtual visit.

## 2019-05-08 NOTE — Telephone Encounter (Signed)
Called the number listed below and left a vm tcb.

## 2019-05-08 NOTE — Telephone Encounter (Signed)
I returned pt call and pt wife states she thinks pt needs a chest xray. She said you have discussed this with them already and would like to have this done. Labs have been ordered and faxed to [facility].

## 2019-05-09 NOTE — Telephone Encounter (Signed)
Called and lmtcb regarding below message.

## 2019-05-09 NOTE — Progress Notes (Signed)
Called pt to schedule multiple times, no answer, Left voicemails.

## 2019-05-10 DIAGNOSIS — J1289 Other viral pneumonia: Secondary | ICD-10-CM | POA: Diagnosis not present

## 2019-05-10 DIAGNOSIS — J9622 Acute and chronic respiratory failure with hypercapnia: Secondary | ICD-10-CM | POA: Diagnosis not present

## 2019-05-10 DIAGNOSIS — U071 COVID-19: Secondary | ICD-10-CM | POA: Diagnosis not present

## 2019-05-10 DIAGNOSIS — I2510 Atherosclerotic heart disease of native coronary artery without angina pectoris: Secondary | ICD-10-CM | POA: Diagnosis not present

## 2019-05-10 DIAGNOSIS — I2609 Other pulmonary embolism with acute cor pulmonale: Secondary | ICD-10-CM | POA: Diagnosis not present

## 2019-05-11 ENCOUNTER — Other Ambulatory Visit: Payer: Self-pay | Admitting: Family Medicine

## 2019-05-11 ENCOUNTER — Encounter: Payer: Self-pay | Admitting: Family Medicine

## 2019-05-11 ENCOUNTER — Ambulatory Visit (INDEPENDENT_AMBULATORY_CARE_PROVIDER_SITE_OTHER): Payer: Medicare Other | Admitting: Family Medicine

## 2019-05-11 DIAGNOSIS — M5090 Cervical disc disorder, unspecified, unspecified cervical region: Secondary | ICD-10-CM

## 2019-05-11 DIAGNOSIS — J1289 Other viral pneumonia: Secondary | ICD-10-CM

## 2019-05-11 DIAGNOSIS — I5032 Chronic diastolic (congestive) heart failure: Secondary | ICD-10-CM

## 2019-05-11 DIAGNOSIS — J9622 Acute and chronic respiratory failure with hypercapnia: Secondary | ICD-10-CM | POA: Diagnosis not present

## 2019-05-11 DIAGNOSIS — U071 COVID-19: Secondary | ICD-10-CM | POA: Diagnosis not present

## 2019-05-11 DIAGNOSIS — F411 Generalized anxiety disorder: Secondary | ICD-10-CM | POA: Diagnosis not present

## 2019-05-11 DIAGNOSIS — I2609 Other pulmonary embolism with acute cor pulmonale: Secondary | ICD-10-CM | POA: Diagnosis not present

## 2019-05-11 DIAGNOSIS — I2510 Atherosclerotic heart disease of native coronary artery without angina pectoris: Secondary | ICD-10-CM | POA: Diagnosis not present

## 2019-05-11 MED ORDER — NORTRIPTYLINE HCL 50 MG PO CAPS: 50.0000 mg | ORAL_CAPSULE | Freq: Every day | ORAL | 1 refills | Status: DC

## 2019-05-11 NOTE — Patient Instructions (Signed)
There are no preventive care reminders to display for this patient.

## 2019-05-11 NOTE — Assessment & Plan Note (Signed)
S: Patient is compliant with Lexapro 20 mg and nortriptyline 75 mg.  Fortunately is not taking cyclobenzaprine.  He was using tramadol for foot pain but since foot pain is better is using last.  Also uses sparing buspirone for anxiety. A/P: We reviewed last visit potential serotonin syndrome risks.  As discussed under cervical arthritis will reduce nortriptyline to 50 mg 75 mg and continue to try to reduce in the future. -We also discussed on Lexapro the increase GI bleeding risk particular with his anticoagulant-suspect benefits outweigh risk at present.No blood in stool or dark black stool

## 2019-05-11 NOTE — Progress Notes (Signed)
Phone [phone] Virtual visit via Video note   Subjective:  Chief complaint: Chief Complaint  Patient presents with  . Follow-up   This visit type was conducted due to national recommendations for restrictions regarding the COVID-19 Pandemic (e.g. social distancing).  This format is felt to be most appropriate for this patient at this time balancing risks to patient and risks to population by having him in for in person visit.  No physical exam was performed (except for noted visual exam or audio findings with Telehealth visits).    Our team/I connected with Wayne Booth at  3:40 PM EST by a video enabled telemedicine application ([email or web address] or caregility through epic) and verified that I am speaking with the correct person using two identifiers.  Location patient: [place] Location provider: [facility], office Persons participating in the virtual visit:  patient  Our team/I discussed the limitations of evaluation and management by telemedicine and the availability of in person appointments. In light of current covid-19 pandemic, patient also understands that we are trying to protect them by minimizing in office contact if at all possible.  The patient expressed consent for telemedicine visit and agreed to proceed. Patient understands insurance will be billed.   ROS-no fever/chills.  Does still have shortness of breath particular with activity.  Past Medical History-  Patient Active Problem List   Diagnosis Date Noted  . Acute on chronic respiratory failure with hypoxia and hypercapnia (HCC) 04/20/2019    Priority: High  . Pneumonia due to COVID-19 virus 04/20/2019    Priority: High  . Memory loss 06/10/2018    Priority: High  . Chronic respiratory failure with hypoxia (HCC) 04/26/2018    Priority: High  . CAD (coronary artery disease) 04/09/2018    Priority: High  . CKD (chronic kidney disease) stage 3, GFR 30-59 ml/min     Priority: High  . (HFpEF) heart failure with  preserved ejection fraction ([place]) 12/16/2017    Priority: High  . Chest pain     Priority: High  . Atrial flutter ([place]) 11/01/2017    Priority: High  . Chronic pulmonary embolism ([place]) 10/23/2016    Priority: High  . Asthmatic bronchitis 06/09/2011    Priority: High  . History of prostate cancer-seed implantation 2010.  Dr. Alinda Money 10/02/2008    Priority: High  . History of renal cell carcinoma 12/26/2007    Priority: High  . PTSD (post-traumatic stress disorder) 07/27/2018    Priority: Medium  . HCAP (healthcare-associated pneumonia) 02/03/2018    Priority: Medium  . History of adenomatous polyp of colon 04/12/2017    Priority: Medium  . Cervical disc disease 11/13/2015    Priority: Medium  . Essential tremor 08/14/2015    Priority: Medium  . Gout 08/14/2015    Priority: Medium  . Hypothyroidism 07/12/2014    Priority: Medium  . Hyperglycemia 07/12/2014    Priority: Medium  . GAD (generalized anxiety disorder) 11/07/2013    Priority: Medium  . OSA (obstructive sleep apnea) 07/07/2011    Priority: Medium  . Morbid obesity ([place]) 10/02/2008    Priority: Medium  . Insomnia 12/26/2007    Priority: Medium  . Essential hypertension 12/28/2006    Priority: Medium  . Aortic atherosclerosis ([place]) 05/10/2018    Priority: Low  . First degree AV block 04/15/2018    Priority: Low  . Constipation 01/21/2018    Priority: Low  . Anticoagulated     Priority: Low  . Pain in left knee 07/15/2017  Priority: Low  . Bilateral foot pain 07/13/2017    Priority: Low  . Community acquired pneumonia 06/18/2017    Priority: Low  . Status post nephrectomy 06/09/2017    Priority: Low  . VTE (venous thromboembolism) 06/09/2017    Priority: Low  . Actinic keratosis 11/27/2009    Priority: Low  . Osteoarthritis 11/13/2008    Priority: Low  . TESTOSTERONE DEFICIENCY 12/28/2006    Priority: Low  . Hyponatremia 04/15/2018  . Pleural effusion 04/14/2018  . Chronic pain 04/09/2018  .  Dyspnea     Medications- reviewed and updated Current Outpatient Medications  Medication Sig Dispense Refill  . albuterol (VENTOLIN HFA) 108 (90 Base) MCG/ACT inhaler Inhale 2 puffs into the lungs every 6 (six) hours as needed for wheezing or shortness of breath. 6.7 g 2  . budesonide-formoterol (SYMBICORT) 160-4.5 MCG/ACT inhaler Inhale 2 puffs into the lungs 2 (two) times daily. 1 Inhaler 0  . busPIRone (BUSPAR) 5 MG tablet TAKE ONE TABLET BY MOUTH TWICE A DAY AS NEEDED ANXIETY 30 tablet 4  . ELIQUIS 5 MG TABS tablet TAKE ONE TABLET BY MOUTH TWICE A DAY (Patient taking differently: Take 5 mg by mouth 2 (two) times daily. ) 60 tablet 10  . escitalopram (LEXAPRO) 20 MG tablet Take 1 tablet (20 mg total) by mouth daily. 90 tablet 1  . furosemide (LASIX) 40 MG tablet Take 1-2 tablets (40-80 mg total) by mouth 2 (two) times daily. 120 tablet 5  . gabapentin (NEURONTIN) 100 MG capsule Take 3 capsules (300 mg total) by mouth at bedtime. 90 capsule 5  . KLOR-CON M20 20 MEQ tablet TAKE ONE TABLET BY MOUTH TWICE A DAY (Patient taking differently: Take 20 mEq by mouth 2 (two) times daily. ) 120 tablet 0  . levothyroxine (SYNTHROID) 150 MCG tablet TAKE ONE TABLET BY MOUTH DAILY (Patient taking differently: Take 150 mcg by mouth daily before breakfast. ) 90 tablet 0  . montelukast (SINGULAIR) 10 MG tablet Take 1 tablet (10 mg total) by mouth daily. 30 tablet 11  . Multiple Vitamins-Minerals (MULTIVITAMINS THER. W/MINERALS) TABS Take 1 tablet by mouth daily.    . nortriptyline (PAMELOR) 50 MG capsule Take 1 capsule (50 mg total) by mouth at bedtime. 90 capsule 1  . propranolol (INDERAL) 10 MG tablet TAKE ONE TABLET BY MOUTH TWICE A DAY 120 tablet 0  . Spacer/Aero-Holding Chambers (AEROCHAMBER MV) inhaler Use as instructed 1 each 0  . traMADol (ULTRAM) 50 MG tablet Take 1 tablet (50 mg total) by mouth every 12 (twelve) hours as needed for moderate pain. 30 tablet 1  . traZODone (DESYREL) 50 MG tablet TAKE  ONE-HALF TO 1 TABLET BY MOUTH AT BEDTIME AS NEEDED FOR SLEEP (Patient taking differently: Take 25-50 mg by mouth at bedtime as needed for sleep. ) 90 tablet 3   No current facility-administered medications for this visit.      Objective:  Ht 5\' 11"  (1.803 m)   Wt 287 lb (130.2 kg)   SpO2 91%   BMI 40.03 kg/m  self reported vitals Gen: NAD, resting comfortably Lungs: nonlabored, normal respiratory rate  Skin: appears dry, no obvious rash    Assessment and Plan    (HFpEF) heart failure with preserved ejection fraction (HCC) S:Down 5 lbs weight wise- slight improvement in breathing.  Several days of 80 in AM and 40 lasix in PM and 2 days of 80mg  and 80 mg.  He is back down to baseline 40 mg Lasix in the morning  and 40 mg in the evening.Foot pain finally feeling better with getting fluids off.  A/P: Appears much improved-breathing likely also improved due to improved fluid status.  Thankfully decreasing of edema has helped with his pain  -He was to have a CBC and CMP at home as well as T3 and T4 but they were unable to obtain-sounds like this is going to be retested tomorrow  Pneumonia due to COVID-19 virus S: Patient's breathing slowly improving-Right side pain with coughing improving slightly  Patient did have some baseline breathing issues and need for oxygen even before episode-current oxygenation around 91%-we discussed trying to keep this 92% or above potentially especially with activity to see if he can get stronger/move more A/P: Appears to be improving.  Would like more rapid progress from therapy perspective-encourage patient to ask what exercises he can work on when physical therapy is not there so he can continue to get stronger  [name] did contact me earlier in the week about potentially getting an x-ray but since he seems to be doing slightly better they are okay with holding off-I think this was likely related to diuresis  Cervical disc disease S: Patient status  post cervical discectomy in the past.  He had nortriptyline increased from 10 mg to 75 mg which resolved pain in the past A/P: I told patient with risk of serotonin syndrome I would like to reduce his overall potential burden-we agreed to try to reduce nortriptyline to 50 mg from 75 mg.  We agreed at follow-up if continues to do well potentially reducing to 25 mg. -Also in regards to potential serotonin syndrome we DC'd cyclobenzaprine  GAD (generalized anxiety disorder) S: Patient is compliant with Lexapro 20 mg and nortriptyline 75 mg.  Fortunately is not taking cyclobenzaprine.  He was using tramadol for foot pain but since foot pain is better is using last.  Also uses sparing buspirone for anxiety. A/P: We reviewed last visit potential serotonin syndrome risks.  As discussed under cervical arthritis will reduce nortriptyline to 50 mg 75 mg and continue to try to reduce in the future. -We also discussed on Lexapro the increase GI bleeding risk particular with his anticoagulant-suspect benefits outweigh risk at present.No blood in stool or dark black stool    Recommended follow up: We did weekly follow-up this visit-patient would like to follow-up in 2 to 3 weeks and he will call to schedule.  Hopefully we can continue to space out visits at that point Future Appointments  Date Time Provider Grant  05/29/2019  3:00 PM Burtis Junes, NP [facility]  06/13/2019  3:00 PM [facility] PFT ROOM [facility] None  06/13/2019  4:00 PM Parrett, Fonnie Mu, NP [facility] None  07/12/2019  2:00 PM Jettie Booze, MD [facility]  07/19/2019 10:30 AM [facility] NURSE [facility]    Lab/Order associations:   ICD-10-CM   1. Chronic heart failure with preserved ejection fraction (HCC)  I50.32   2. Pneumonia due to COVID-19 virus  U07.1    J12.89   3. Cervical disc disease  M50.90   4. GAD (generalized anxiety disorder)  F41.1     Meds ordered this  encounter  Medications  . nortriptyline (PAMELOR) 50 MG capsule    Sig: Take 1 capsule (50 mg total) by mouth at bedtime.    Dispense:  90 capsule    Refill:  1    Return precautions advised.  [name], MD

## 2019-05-11 NOTE — Assessment & Plan Note (Signed)
S:Down 5 lbs weight wise- slight improvement in breathing.  Several days of 80 in AM and 40 lasix in PM and 2 days of 80mg  and 80 mg.  He is back down to baseline 40 mg Lasix in the morning and 40 mg in the evening.Foot pain finally feeling better with getting fluids off.  A/P: Appears much improved-breathing likely also improved due to improved fluid status.  Thankfully decreasing of edema has helped with his pain  -He was to have a CBC and CMP at home as well as T3 and T4 but they were unable to obtain-sounds like this is going to be retested tomorrow

## 2019-05-11 NOTE — Assessment & Plan Note (Addendum)
S: Patient's breathing slowly improving-Right side pain with coughing improving slightly  Patient did have some baseline breathing issues and need for oxygen even before episode-current oxygenation around 91%-we discussed trying to keep this 92% or above potentially especially with activity to see if he can get stronger/move more A/P: Appears to be improving.  Would like more rapid progress from therapy perspective-encourage patient to ask what exercises he can work on when physical therapy is not there so he can continue to get stronger  [name] did contact me earlier in the week about potentially getting an x-ray but since he seems to be doing slightly better they are okay with holding off-I think this was likely related to diuresis

## 2019-05-11 NOTE — Assessment & Plan Note (Signed)
S: Patient status post cervical discectomy in the past.  He had nortriptyline increased from 10 mg to 75 mg which resolved pain in the past A/P: I told patient with risk of serotonin syndrome I would like to reduce his overall potential burden-we agreed to try to reduce nortriptyline to 50 mg from 75 mg.  We agreed at follow-up if continues to do well potentially reducing to 25 mg. -Also in regards to potential serotonin syndrome we DC'd cyclobenzaprine

## 2019-05-12 DIAGNOSIS — I130 Hypertensive heart and chronic kidney disease with heart failure and stage 1 through stage 4 chronic kidney disease, or unspecified chronic kidney disease: Secondary | ICD-10-CM | POA: Diagnosis not present

## 2019-05-13 LAB — CBC AND DIFFERENTIAL
HCT: 45 (ref 41–53)
Hemoglobin: 15.7 (ref 13.5–17.5)
Neutrophils Absolute: 7
Platelets: 334 (ref 150–399)
WBC: 10.4

## 2019-05-13 LAB — BASIC METABOLIC PANEL
BUN: 28 — AB (ref 4–21)
Creatinine: 1.4 — AB (ref 0.6–1.3)
Glucose: 135
Sodium: 136 — AB (ref 137–147)

## 2019-05-13 LAB — CBC: RBC: 4.98 (ref 3.87–5.11)

## 2019-05-13 LAB — COMPREHENSIVE METABOLIC PANEL: GFR calc non Af Amer: 50

## 2019-05-15 DIAGNOSIS — U071 COVID-19: Secondary | ICD-10-CM | POA: Diagnosis not present

## 2019-05-15 DIAGNOSIS — I2510 Atherosclerotic heart disease of native coronary artery without angina pectoris: Secondary | ICD-10-CM | POA: Diagnosis not present

## 2019-05-15 DIAGNOSIS — J1289 Other viral pneumonia: Secondary | ICD-10-CM | POA: Diagnosis not present

## 2019-05-15 DIAGNOSIS — I2609 Other pulmonary embolism with acute cor pulmonale: Secondary | ICD-10-CM | POA: Diagnosis not present

## 2019-05-15 DIAGNOSIS — J9622 Acute and chronic respiratory failure with hypercapnia: Secondary | ICD-10-CM | POA: Diagnosis not present

## 2019-05-17 DIAGNOSIS — I2609 Other pulmonary embolism with acute cor pulmonale: Secondary | ICD-10-CM | POA: Diagnosis not present

## 2019-05-17 DIAGNOSIS — U071 COVID-19: Secondary | ICD-10-CM | POA: Diagnosis not present

## 2019-05-17 DIAGNOSIS — J9622 Acute and chronic respiratory failure with hypercapnia: Secondary | ICD-10-CM | POA: Diagnosis not present

## 2019-05-17 DIAGNOSIS — I2510 Atherosclerotic heart disease of native coronary artery without angina pectoris: Secondary | ICD-10-CM | POA: Diagnosis not present

## 2019-05-17 DIAGNOSIS — J1289 Other viral pneumonia: Secondary | ICD-10-CM | POA: Diagnosis not present

## 2019-05-18 DIAGNOSIS — I2510 Atherosclerotic heart disease of native coronary artery without angina pectoris: Secondary | ICD-10-CM | POA: Diagnosis not present

## 2019-05-18 DIAGNOSIS — U071 COVID-19: Secondary | ICD-10-CM | POA: Diagnosis not present

## 2019-05-18 DIAGNOSIS — J9622 Acute and chronic respiratory failure with hypercapnia: Secondary | ICD-10-CM | POA: Diagnosis not present

## 2019-05-18 DIAGNOSIS — I2609 Other pulmonary embolism with acute cor pulmonale: Secondary | ICD-10-CM | POA: Diagnosis not present

## 2019-05-18 DIAGNOSIS — J1289 Other viral pneumonia: Secondary | ICD-10-CM | POA: Diagnosis not present

## 2019-05-22 ENCOUNTER — Encounter: Payer: Self-pay | Admitting: Pulmonary Disease

## 2019-05-22 DIAGNOSIS — J9622 Acute and chronic respiratory failure with hypercapnia: Secondary | ICD-10-CM | POA: Diagnosis not present

## 2019-05-22 DIAGNOSIS — I2609 Other pulmonary embolism with acute cor pulmonale: Secondary | ICD-10-CM | POA: Diagnosis not present

## 2019-05-22 DIAGNOSIS — I2510 Atherosclerotic heart disease of native coronary artery without angina pectoris: Secondary | ICD-10-CM | POA: Diagnosis not present

## 2019-05-22 DIAGNOSIS — J1289 Other viral pneumonia: Secondary | ICD-10-CM | POA: Diagnosis not present

## 2019-05-22 DIAGNOSIS — U071 COVID-19: Secondary | ICD-10-CM | POA: Diagnosis not present

## 2019-05-22 NOTE — Telephone Encounter (Signed)
error 

## 2019-05-23 DIAGNOSIS — Z96659 Presence of unspecified artificial knee joint: Secondary | ICD-10-CM

## 2019-05-23 DIAGNOSIS — Z7951 Long term (current) use of inhaled steroids: Secondary | ICD-10-CM

## 2019-05-23 DIAGNOSIS — Z8546 Personal history of malignant neoplasm of prostate: Secondary | ICD-10-CM

## 2019-05-23 DIAGNOSIS — U071 COVID-19: Secondary | ICD-10-CM | POA: Diagnosis not present

## 2019-05-23 DIAGNOSIS — I700 Atherosclerosis of aorta: Secondary | ICD-10-CM

## 2019-05-23 DIAGNOSIS — Z85528 Personal history of other malignant neoplasm of kidney: Secondary | ICD-10-CM

## 2019-05-23 DIAGNOSIS — F419 Anxiety disorder, unspecified: Secondary | ICD-10-CM

## 2019-05-23 DIAGNOSIS — E039 Hypothyroidism, unspecified: Secondary | ICD-10-CM

## 2019-05-23 DIAGNOSIS — Z7901 Long term (current) use of anticoagulants: Secondary | ICD-10-CM

## 2019-05-23 DIAGNOSIS — I2510 Atherosclerotic heart disease of native coronary artery without angina pectoris: Secondary | ICD-10-CM | POA: Diagnosis not present

## 2019-05-23 DIAGNOSIS — J9622 Acute and chronic respiratory failure with hypercapnia: Secondary | ICD-10-CM | POA: Diagnosis not present

## 2019-05-23 DIAGNOSIS — J4540 Moderate persistent asthma, uncomplicated: Secondary | ICD-10-CM

## 2019-05-23 DIAGNOSIS — Z9181 History of falling: Secondary | ICD-10-CM

## 2019-05-23 DIAGNOSIS — I480 Paroxysmal atrial fibrillation: Secondary | ICD-10-CM

## 2019-05-23 DIAGNOSIS — N1831 Chronic kidney disease, stage 3a: Secondary | ICD-10-CM

## 2019-05-23 DIAGNOSIS — I5033 Acute on chronic diastolic (congestive) heart failure: Secondary | ICD-10-CM

## 2019-05-23 DIAGNOSIS — J1289 Other viral pneumonia: Secondary | ICD-10-CM | POA: Diagnosis not present

## 2019-05-23 DIAGNOSIS — Z6839 Body mass index (BMI) 39.0-39.9, adult: Secondary | ICD-10-CM

## 2019-05-23 DIAGNOSIS — I2609 Other pulmonary embolism with acute cor pulmonale: Secondary | ICD-10-CM | POA: Diagnosis not present

## 2019-05-23 DIAGNOSIS — G4733 Obstructive sleep apnea (adult) (pediatric): Secondary | ICD-10-CM

## 2019-05-23 DIAGNOSIS — Z905 Acquired absence of kidney: Secondary | ICD-10-CM

## 2019-05-23 DIAGNOSIS — E871 Hypo-osmolality and hyponatremia: Secondary | ICD-10-CM

## 2019-05-23 DIAGNOSIS — F329 Major depressive disorder, single episode, unspecified: Secondary | ICD-10-CM

## 2019-05-23 DIAGNOSIS — I130 Hypertensive heart and chronic kidney disease with heart failure and stage 1 through stage 4 chronic kidney disease, or unspecified chronic kidney disease: Secondary | ICD-10-CM

## 2019-05-23 DIAGNOSIS — Z9981 Dependence on supplemental oxygen: Secondary | ICD-10-CM

## 2019-05-23 DIAGNOSIS — J9811 Atelectasis: Secondary | ICD-10-CM

## 2019-05-23 DIAGNOSIS — Z79891 Long term (current) use of opiate analgesic: Secondary | ICD-10-CM

## 2019-05-23 NOTE — Progress Notes (Deleted)
CARDIOLOGY OFFICE NOTE  Date:  05/23/2019    Wayne Booth Date of Birth: 10-05-1940 Medical Record [id number]  PCP:  Marin Olp, MD  Cardiologist:  Irish Lack   No chief complaint on file.   History of Present Illness: Wayne Booth is a 78 y.o. male who presents today for a post hospital visit. Seen for Dr. Irish Lack.   He has a history of OSA, HTN, asthma, prostate CA, RCC, intermittent O2 dependence, and obesity. Had marked dizziness in 2017 - meds were cut back with improvement. Normal cath in 2008. Has had prior PE following hand sugery - on Xarelto. Found to have atrial flutter in 2019 with RVR twice - changed to Eliquis. Converted to NSR. Has since had issues with bradycardia.   Referred to EP for recurrent AF/flutter with RVR. Noted sleep study planned for July. Ablation was performed.   He was admitted in October with dyspnea - wife had been COVID + and he was confirmed to have as well.  CXR demonstrated bilateral infiltrates. Remdesivir and steroids were started, though he later developed acute pulmonary edema due to acute on chronic cor pulmonale and was given IV lasix with improvement. He completed  remdesivir and 9 total days of steroid therapy. He remained deconditioned and short of breath with exertion, despite improving hypoxia.   Noted that last year he required life support in the setting of pneumonia.  The patient {does/does not:200015} have symptoms concerning for COVID-19 infection (fever, chills, cough, or new shortness of breath).   Comes in today. Here with   Past Medical History:  Diagnosis Date  . Allergy    States his nose runs when he is around grease.   Marland Kitchen Anxiety   . Arthritis   . Atrial fibrillation ([place])   . Cancer ([place])   . Coronary artery disease   . History of total knee replacement   . Hypertension   . Hypothyroidism   . Kidney cancer, primary, with metastasis from kidney to other site [facility])   . Kidney stone   . OSA  (obstructive sleep apnea)    pt does not wear cpap at night  . Pituitary cyst ([place])   . Pneumonia    hx of  . Prostate cancer [facility])     Past Surgical History:  Procedure Laterality Date  . ABLATION OF DYSRHYTHMIC FOCUS  01/28/2018  . ANTERIOR CERVICAL DECOMP/DISCECTOMY FUSION N/A 08/29/2012   Procedure: ANTERIOR CERVICAL DECOMPRESSION/DISCECTOMY FUSION 1 LEVEL;  Surgeon: Eustace Moore, MD;  Location: [place] [facility]UR[facility] ORS;  Service: Neurosurgery;  Laterality: N/A;  . APPENDECTOMY    . ATRIAL FIBRILLATION ABLATION N/A 01/28/2018   Procedure: ATRIAL FIBRILLATION ABLATION;  Surgeon: Constance Haw, MD;  Location: [place] CV LAB;  Service: Cardiovascular;  Laterality: N/A;  . CARDIAC CATHETERIZATION  2008   clean  . CHOLECYSTECTOMY    . COLONOSCOPY W/ POLYPECTOMY    . CRANIOTOMY N/A 11/08/2012   Procedure: CRANIOTOMY HYPOPHYSECTOMY TRANSNASAL APPROACH;  Surgeon: Faythe Ghee, MD;  Location: [place] NEURO ORS;  Service: Neurosurgery;  Laterality: N/A;  Transphenoidal Resection of Pituitary Tumor  . FINGER SURGERY Left 2017   Dr Amedeo Plenty  . INSERTION PROSTATE RADIATION SEED    . IR THORACENTESIS ASP PLEURAL SPACE W/IMG GUIDE  04/15/2018  . KIDNEY STONE SURGERY    . KNEE ARTHROSCOPY  2007   left  . NEPHRECTOMY Left   . POSTERIOR CERVICAL LAMINECTOMY Left 04/24/2015   Procedure: Foraminotomy cervical five -  cervical six cervical six - cervical seven left;  Surgeon: Eustace Moore, MD;  Location: [place] NEUR[facility] ORS;  Service: Neurosurgery;  Laterality: Left;  . REPLACEMENT TOTAL KNEE BILATERAL       Medications: No outpatient medications have been marked as taking for the 05/29/19 encounter (Appointment) with Burtis Junes, NP.     Allergies: Allergies  Allergen Reactions  . Ambien [Zolpidem] Anxiety    Anxiety and agitation when tried as sleeper in hospital aug 2019    Social History: The patient  reports that he has quit smoking. His smoking use included cigars. He quit after 4.00 years  of use. He has never used smokeless tobacco. He reports current alcohol use of about 2.0 standard drinks of alcohol per week. He reports that he does not use drugs.   Family History: The patient's ***family history includes Cancer in his father and mother; Colon cancer (age of onset: 18) in his brother; Kidney cancer in his sister.   Review of Systems: Please see the history of present illness.   All other systems are reviewed and negative.   Physical Exam: VS:  There were no vitals taken for this visit. Marland Kitchen  BMI There is no height or weight on file to calculate BMI.  Wt Readings from Last 3 Encounters:  05/11/19 287 lb (130.2 kg)  05/05/19 294 lb (133.4 kg)  04/28/19 273 lb 9.5 oz (124.1 kg)    General: Pleasant. Well developed, well nourished and in no acute distress.   HEENT: Normal.  Neck: Supple, no JVD, carotid bruits, or masses noted.  Cardiac: ***Regular rate and rhythm. No murmurs, rubs, or gallops. No edema.  Respiratory:  Lungs are clear to auscultation bilaterally with normal work of breathing.  GI: Soft and nontender.  MS: No deformity or atrophy. Gait and ROM intact.  Skin: Warm and dry. Color is normal.  Neuro:  Strength and sensation are intact and no gross focal deficits noted.  Psych: Alert, appropriate and with normal affect.   LABORATORY DATA:  EKG:  EKG {ACTION; IS/IS GI:087931 ordered today. This demonstrates ***.  Lab Results  Component Value Date   WBC 17.0 (H) 04/27/2019   HGB 16.5 04/27/2019   HCT 51.0 04/27/2019   PLT 473 (H) 04/27/2019   GLUCOSE 138 (H) 04/27/2019   CHOL 141 04/12/2017   TRIG 100 02/18/2018   HDL 38.20 (L) 04/12/2017   LDLDIRECT 62.0 09/07/2006   LDLCALC 69 04/12/2017   ALT 62 (H) 04/27/2019   AST 47 (H) 04/27/2019   NA 135 04/27/2019   K 4.4 04/27/2019   CL 96 (L) 04/27/2019   CREATININE 1.11 04/27/2019   BUN 32 (H) 04/27/2019   CO2 28 04/27/2019   TSH 0.45 06/10/2018   PSA <0.015 03/25/2018   INR 1.3 (H)  04/20/2019   HGBA1C 5.9 05/10/2018       BNP (last 3 results) Recent Labs    04/20/19 1915  BNP 91.8    ProBNP (last 3 results) Recent Labs    11/23/18 1103 01/24/19 1024 03/15/19 1209  PROBNP 23.0 27.0 31.0     Other Studies Reviewed Today: Study Conclusions  - Left ventricle: The cavity size was normal. Systolic function was   normal. The estimated ejection fraction was in the range of 50%   to 55%. Although no diagnostic regional wall motion abnormality   was identified, this possibility cannot be completely excluded on   the basis of this study.  Impressions:  - Technically difficult  study, even with administration of echo   contrast. Grossly normal LV function without clear wall motion   abnormalities. No significant valvular disease.   Myoview 12/04/17 1. Decreased uptake along the proximal septum during both rest and stress images suggesting remote infarct. 2. Small area of reversible ischemia in the apex. 3. Mild hypokinesia along the septum. Wall motion and thickening is otherwise normal. 4. Left ventricular ejection fraction is 51%. 5. Noninvasive risk stratification*: Intermediate  ASSESSMENT AND PLAN:  1.  Recent COVID + illness -with respiratory failure  2. Aypical atrial flutter/atrial fibrillation: Rates were difficult to control due to resting bradycardia.  He has had prior ablation.   3. Prior abnormal stress test  4. Morbid obesity  5. OSA  6. HTN  7. RCC  . COVID-19 Education: The signs and symptoms of COVID-19 were discussed with the patient and how to seek care for testing (follow up with PCP or arrange E-visit).  The importance of social distancing, staying at home, hand hygiene and wearing a mask when out in public were discussed today.  Current medicines are reviewed with the patient today.  The patient does not have concerns regarding medicines other than what has been noted above.  The following changes have been  made:  See above.  Labs/ tests ordered today include:   No orders of the defined types were placed in this encounter.    Disposition:   FU with *** in {gen number [id number] {Days to years:10300}.   Patient is agreeable to this plan and will call if any problems develop in the interim.   SignedTruitt Merle, NP  05/23/2019 10:01 PM  [facility] [address] [place], [place]  [address] Phone: [phone] Fax: [phone]

## 2019-05-24 ENCOUNTER — Telehealth: Payer: Self-pay

## 2019-05-24 DIAGNOSIS — I2609 Other pulmonary embolism with acute cor pulmonale: Secondary | ICD-10-CM | POA: Diagnosis not present

## 2019-05-24 DIAGNOSIS — I2510 Atherosclerotic heart disease of native coronary artery without angina pectoris: Secondary | ICD-10-CM | POA: Diagnosis not present

## 2019-05-24 DIAGNOSIS — J9622 Acute and chronic respiratory failure with hypercapnia: Secondary | ICD-10-CM | POA: Diagnosis not present

## 2019-05-24 DIAGNOSIS — U071 COVID-19: Secondary | ICD-10-CM | POA: Diagnosis not present

## 2019-05-24 DIAGNOSIS — J1289 Other viral pneumonia: Secondary | ICD-10-CM | POA: Diagnosis not present

## 2019-05-24 NOTE — Telephone Encounter (Signed)
Copied from [place] [phone]. Topic: General - Other >> May 24, 2019  1:36 PM Carolyn Stare wrote: Eliezer Lofts with [facility] call and wanted to let Dr Yong Channel know that Physical therapy weighed the pateint today and he has lost  34  pounds in the last 10 days. He is still on oxgyen from COVID 4 liters d an

## 2019-05-24 NOTE — Telephone Encounter (Signed)
After COVID-19 I worry about blood clots-that being said patient is on Eliquis-please make sure he is taking this regularly.  Due to weight loss-lets decrease Lasix to 40 mg once a day and schedule a video visit Monday through Wednesday of next week-same-day visit okay.  Lets see if we can get home health nurse to draw CBC, CMP under fatigue unspecified-possible his electrolytes are off if we are over diuresing him-I am worried about this due to significant weight loss.  Make sure he is checking his temperature-if he develops a fever he needs to seek care over the weekend at urgent care or in the hospital.  Make sure to eat small more frequent meals to see if we can decrease nausea.

## 2019-05-24 NOTE — Telephone Encounter (Signed)
Called patient appitite has been ok but some days not so well. He is still on 4L of oxygen and setting he is ok. But if he takes off and goes to bathroom he can get short of breath. He has some selling in feet but no pitting. He has been taking fluid pills. Has cut back on juice and sodas. He is sill having low to no energy. He has been feeling worse over the last week. He has noticed that he has had more trouble with breathing. He denies any cough. He has "felt feverish at times but has not checked".   Oxygen is 95 on 4l of oxygen. Pulse 103 He states that he has been having increased nausea.

## 2019-05-24 NOTE — Telephone Encounter (Signed)
Called patient changed to in office app

## 2019-05-24 NOTE — Telephone Encounter (Signed)
If he feels strong enough-I am fine with him coming in the office.  The reason I suggested virtual visit was for his convenience-I am fine if he wants to come in

## 2019-05-24 NOTE — Telephone Encounter (Signed)
Called home health lab is not open until Monday. Per Dr. Yong Channel will call patient and see if he thinks he will be able to come in office on Monday and get labs done. If he is not able we will order with home health and make virtual appointment with hunter on Tuesday.

## 2019-05-24 NOTE — Telephone Encounter (Signed)
FYI: I called and gave pt below information and pt was very upset because he cant be seen in person. He stated that you want him to do all of this and haven't even seen him in person. He stated that if Dr. Yong Channel cant see him in person anymore during this then he will find another provider who can. I was able to get pt to agree to scheduling a virtual visit per Dr. Yong Channel note and he agreed and made sure to let me/us know that he is not happy about this at all. I reminded him of the COVID restrictions here at the office and [facility] policy and transferred him to get scheduled.

## 2019-05-29 ENCOUNTER — Encounter (HOSPITAL_COMMUNITY): Payer: Self-pay

## 2019-05-29 ENCOUNTER — Emergency Department (HOSPITAL_COMMUNITY)
Admission: EM | Admit: 2019-05-29 | Discharge: 2019-05-29 | Disposition: A | Discharge status: Home / Self Care | Payer: Medicare Other | Attending: Emergency Medicine | Admitting: Emergency Medicine

## 2019-05-29 ENCOUNTER — Ambulatory Visit: Payer: Medicare Other | Admitting: Family Medicine

## 2019-05-29 ENCOUNTER — Ambulatory Visit: Payer: Medicare Other | Admitting: Nurse Practitioner

## 2019-05-29 ENCOUNTER — Other Ambulatory Visit: Payer: Self-pay

## 2019-05-29 ENCOUNTER — Emergency Department (HOSPITAL_COMMUNITY): Payer: Medicare Other

## 2019-05-29 DIAGNOSIS — J1282 Pneumonia due to coronavirus disease 2019: Secondary | ICD-10-CM

## 2019-05-29 DIAGNOSIS — E871 Hypo-osmolality and hyponatremia: Secondary | ICD-10-CM

## 2019-05-29 DIAGNOSIS — J9811 Atelectasis: Secondary | ICD-10-CM

## 2019-05-29 DIAGNOSIS — Z79891 Long term (current) use of opiate analgesic: Secondary | ICD-10-CM

## 2019-05-29 DIAGNOSIS — J4540 Moderate persistent asthma, uncomplicated: Secondary | ICD-10-CM

## 2019-05-29 DIAGNOSIS — Z87891 Personal history of nicotine dependence: Secondary | ICD-10-CM | POA: Insufficient documentation

## 2019-05-29 DIAGNOSIS — U071 COVID-19: Secondary | ICD-10-CM | POA: Insufficient documentation

## 2019-05-29 DIAGNOSIS — F419 Anxiety disorder, unspecified: Secondary | ICD-10-CM

## 2019-05-29 DIAGNOSIS — Z7951 Long term (current) use of inhaled steroids: Secondary | ICD-10-CM

## 2019-05-29 DIAGNOSIS — Z85528 Personal history of other malignant neoplasm of kidney: Secondary | ICD-10-CM

## 2019-05-29 DIAGNOSIS — Z7901 Long term (current) use of anticoagulants: Secondary | ICD-10-CM | POA: Diagnosis not present

## 2019-05-29 DIAGNOSIS — Z79899 Other long term (current) drug therapy: Secondary | ICD-10-CM | POA: Insufficient documentation

## 2019-05-29 DIAGNOSIS — J1289 Other viral pneumonia: Secondary | ICD-10-CM | POA: Diagnosis not present

## 2019-05-29 DIAGNOSIS — I130 Hypertensive heart and chronic kidney disease with heart failure and stage 1 through stage 4 chronic kidney disease, or unspecified chronic kidney disease: Secondary | ICD-10-CM

## 2019-05-29 DIAGNOSIS — I480 Paroxysmal atrial fibrillation: Secondary | ICD-10-CM

## 2019-05-29 DIAGNOSIS — E039 Hypothyroidism, unspecified: Secondary | ICD-10-CM | POA: Diagnosis not present

## 2019-05-29 DIAGNOSIS — I5033 Acute on chronic diastolic (congestive) heart failure: Secondary | ICD-10-CM

## 2019-05-29 DIAGNOSIS — I2609 Other pulmonary embolism with acute cor pulmonale: Secondary | ICD-10-CM | POA: Diagnosis not present

## 2019-05-29 DIAGNOSIS — I2510 Atherosclerotic heart disease of native coronary artery without angina pectoris: Secondary | ICD-10-CM | POA: Insufficient documentation

## 2019-05-29 DIAGNOSIS — Z8546 Personal history of malignant neoplasm of prostate: Secondary | ICD-10-CM

## 2019-05-29 DIAGNOSIS — J9622 Acute and chronic respiratory failure with hypercapnia: Secondary | ICD-10-CM | POA: Diagnosis not present

## 2019-05-29 DIAGNOSIS — I700 Atherosclerosis of aorta: Secondary | ICD-10-CM

## 2019-05-29 DIAGNOSIS — Z96659 Presence of unspecified artificial knee joint: Secondary | ICD-10-CM

## 2019-05-29 DIAGNOSIS — N183 Chronic kidney disease, stage 3 unspecified: Secondary | ICD-10-CM | POA: Diagnosis not present

## 2019-05-29 DIAGNOSIS — J189 Pneumonia, unspecified organism: Secondary | ICD-10-CM | POA: Diagnosis not present

## 2019-05-29 DIAGNOSIS — R0602 Shortness of breath: Secondary | ICD-10-CM

## 2019-05-29 DIAGNOSIS — Z6839 Body mass index (BMI) 39.0-39.9, adult: Secondary | ICD-10-CM

## 2019-05-29 DIAGNOSIS — I129 Hypertensive chronic kidney disease with stage 1 through stage 4 chronic kidney disease, or unspecified chronic kidney disease: Secondary | ICD-10-CM | POA: Diagnosis not present

## 2019-05-29 DIAGNOSIS — Z9181 History of falling: Secondary | ICD-10-CM

## 2019-05-29 DIAGNOSIS — Z905 Acquired absence of kidney: Secondary | ICD-10-CM

## 2019-05-29 DIAGNOSIS — G4733 Obstructive sleep apnea (adult) (pediatric): Secondary | ICD-10-CM

## 2019-05-29 DIAGNOSIS — Z9981 Dependence on supplemental oxygen: Secondary | ICD-10-CM

## 2019-05-29 DIAGNOSIS — F329 Major depressive disorder, single episode, unspecified: Secondary | ICD-10-CM

## 2019-05-29 DIAGNOSIS — N1831 Chronic kidney disease, stage 3a: Secondary | ICD-10-CM

## 2019-05-29 LAB — BASIC METABOLIC PANEL
Anion gap: 9 (ref 5–15)
BUN: 19 mg/dL (ref 8–23)
CO2: 28 mmol/L (ref 22–32)
Calcium: 9 mg/dL (ref 8.9–10.3)
Chloride: 100 mmol/L (ref 98–111)
Creatinine, Ser: 1.16 mg/dL (ref 0.61–1.24)
GFR calc Af Amer: >60 mL/min (ref >60)
GFR calc non Af Amer: >60 mL/min (ref >60)
Glucose, Bld: 121 mg/dL — ABNORMAL HIGH (ref 70–99)
Potassium: 3.9 mmol/L (ref 3.5–5.1)
Sodium: 137 mmol/L (ref 135–145)

## 2019-05-29 LAB — CBC WITH DIFFERENTIAL/PLATELET
Abs Immature Granulocytes: 0.08 10*3/uL — ABNORMAL HIGH (ref 0.00–0.07)
Basophils Absolute: 0.1 10*3/uL (ref 0.0–0.1)
Basophils Relative: 1 %
Eosinophils Absolute: 0.7 10*3/uL — ABNORMAL HIGH (ref 0.0–0.5)
Eosinophils Relative: 6 %
HCT: 47.5 % (ref 39.0–52.0)
Hemoglobin: 15.8 g/dL (ref 13.0–17.0)
Immature Granulocytes: 1 %
Lymphocytes Relative: 11 %
Lymphs Abs: 1.3 10*3/uL (ref 0.7–4.0)
MCH: 31 pg (ref 26.0–34.0)
MCHC: 33.3 g/dL (ref 30.0–36.0)
MCV: 93.3 fL (ref 80.0–100.0)
Monocytes Absolute: 1.2 10*3/uL — ABNORMAL HIGH (ref 0.1–1.0)
Monocytes Relative: 10 %
Neutro Abs: 8.2 10*3/uL — ABNORMAL HIGH (ref 1.7–7.7)
Neutrophils Relative %: 71 %
Platelets: 334 10*3/uL (ref 150–400)
RBC: 5.09 MIL/uL (ref 4.22–5.81)
RDW: 13.8 % (ref 11.5–15.5)
WBC: 11.5 10*3/uL — ABNORMAL HIGH (ref 4.0–10.5)
nRBC: 0 % (ref 0.0–0.2)

## 2019-05-29 LAB — D-DIMER, QUANTITATIVE: D-Dimer, Quant: 1.29 ug/mL-FEU — ABNORMAL HIGH (ref 0.00–0.50)

## 2019-05-29 LAB — TROPONIN I (HIGH SENSITIVITY)
Troponin I (High Sensitivity): 8 ng/L (ref <18)
Troponin I (High Sensitivity): 8 ng/L (ref <18)

## 2019-05-29 LAB — LACTIC ACID, PLASMA
Lactic Acid, Venous: 1.7 mmol/L (ref 0.5–1.9)
Lactic Acid, Venous: 2.4 mmol/L (ref 0.5–1.9)

## 2019-05-29 LAB — BRAIN NATRIURETIC PEPTIDE: B Natriuretic Peptide: 55.3 pg/mL (ref 0.0–100.0)

## 2019-05-29 MED ORDER — PREDNISONE 10 MG (21) PO TBPK: ORAL_TABLET | ORAL | 0 refills | Status: DC

## 2019-05-29 MED ORDER — SODIUM CHLORIDE (PF) 0.9 % IJ SOLN
INTRAMUSCULAR | Status: AC
  Filled 2019-05-29: qty 50

## 2019-05-29 MED ORDER — IOHEXOL 350 MG/ML SOLN
100.0000 mL | Freq: Once | INTRAVENOUS | Status: AC | PRN
  Administered 2019-05-29: 100 mL via INTRAVENOUS

## 2019-05-29 MED ORDER — LORAZEPAM 2 MG/ML IJ SOLN
1.0000 mg | Freq: Once | INTRAMUSCULAR | Status: AC
  Administered 2019-05-29: 1 mg via INTRAVENOUS
  Filled 2019-05-29: qty 1

## 2019-05-29 MED ORDER — DOXYCYCLINE HYCLATE 100 MG PO CAPS: 100.0000 mg | ORAL_CAPSULE | Freq: Two times a day (BID) | ORAL | 0 refills | Status: DC

## 2019-05-29 NOTE — ED Provider Notes (Signed)
[facility] DEPT Provider Note   CSN: [id number] Arrival date & time: 05/29/19  1423     History   Chief Complaint Chief Complaint  Patient presents with  . Shortness of Breath    HPI Wayne Booth is a 78 y.o. male.     Pt presents to the ED today with sob.  Pt has a hx of Covid-19 and was hospitalized from 10/22-10/30 for the covid.  The pt said he had been doing ok from a respiratory standpoint until yesterday.  Last night he developed severe sob.  The pt said he did not sleep well last night and was still sob today.  No new fevers.  + n/v.       Past Medical History:  Diagnosis Date  . Allergy    States his nose runs when he is around grease.   Marland Kitchen Anxiety   . Arthritis   . Atrial fibrillation ([place])   . Cancer ([place])   . Coronary artery disease   . History of total knee replacement   . Hypertension   . Hypothyroidism   . Kidney cancer, primary, with metastasis from kidney to other site [facility])   . Kidney stone   . OSA (obstructive sleep apnea)    pt does not wear cpap at night  . Pituitary cyst ([place])   . Pneumonia    hx of  . Prostate cancer [facility])     Patient Active Problem List   Diagnosis Date Noted  . Acute on chronic respiratory failure with hypoxia and hypercapnia ([place]) 04/20/2019  . Pneumonia due to COVID-19 virus 04/20/2019  . PTSD (post-traumatic stress disorder) 07/27/2018  . Memory loss 06/10/2018  . Aortic atherosclerosis ([place]) 05/10/2018  . Chronic respiratory failure with hypoxia ([place]) 04/26/2018  . First degree AV block 04/15/2018  . Hyponatremia 04/15/2018  . Pleural effusion 04/14/2018  . CAD (coronary artery disease) 04/09/2018  . Chronic pain 04/09/2018  . CKD (chronic kidney disease) stage 3, GFR 30-59 ml/min   . Dyspnea   . HCAP (healthcare-associated pneumonia) 02/03/2018  . Constipation 01/21/2018  . (HFpEF) heart failure with preserved ejection fraction ([place]) 12/16/2017  . Chest pain   .  Anticoagulated   . Atrial flutter ([place]) 11/01/2017  . Pain in left knee 07/15/2017  . Bilateral foot pain 07/13/2017  . Community acquired pneumonia 06/18/2017  . 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 VTE (venous thromboembolism) 06/09/2017  . History of adenomatous polyp of colon 04/12/2017  . Chronic pulmonary embolism ([place]) 10/23/2016  . Cervical disc disease 11/13/2015  . Essential tremor 08/14/2015  . Gout 08/14/2015  . Hypothyroidism 07/12/2014  . Hyperglycemia 07/12/2014  . GAD (generalized anxiety disorder) 11/07/2013  . OSA (obstructive sleep apnea) 07/07/2011  . Asthmatic bronchitis 06/09/2011  . Actinic keratosis 11/27/2009  . Osteoarthritis 11/13/2008  . History of prostate cancer-seed implantation 2010.  Dr. Alinda Money 10/02/2008  . Morbid obesity ([place]) 10/02/2008  . History of renal cell carcinoma 12/26/2007  . Insomnia 12/26/2007  . TESTOSTERONE DEFICIENCY 12/28/2006  . Essential hypertension 12/28/2006    Past Surgical History:  Procedure Laterality Date  . ABLATION OF DYSRHYTHMIC FOCUS  01/28/2018  . ANTERIOR CERVICAL DECOMP/DISCECTOMY FUSION N/A 08/29/2012   Procedure: ANTERIOR CERVICAL DECOMPRESSION/DISCECTOMY FUSION 1 LEVEL;  Surgeon: Eustace Moore, MD;  Location: [facility] NEURO ORS;  Service: Neurosurgery;  Laterality: N/A;  . APPENDECTOMY    . ATRIAL FIBRILLATION ABLATION N/A 01/28/2018   Procedure: ATRIAL FIBRILLATION ABLATION;  Surgeon: Constance Haw,  MD;  Location: [facility] CV LAB;  Service: Cardiovascular;  Laterality: N/A;  . CARDIAC CATHETERIZATION  2008   clean  . CHOLECYSTECTOMY    . COLONOSCOPY W/ POLYPECTOMY    . CRANIOTOMY N/A 11/08/2012   Procedure: CRANIOTOMY HYPOPHYSECTOMY TRANSNASAL APPROACH;  Surgeon: Faythe Ghee, MD;  Location: [place] NEURO ORS;  Service: Neurosurgery;  Laterality: N/A;  Transphenoidal Resection of Pituitary Tumor  . FINGER SURGERY Left 2017   Dr Amedeo Plenty  . INSERTION PROSTATE RADIATION SEED    . IR THORACENTESIS ASP PLEURAL  SPACE W/IMG GUIDE  04/15/2018  . KIDNEY STONE SURGERY    . KNEE ARTHROSCOPY  2007   left  . NEPHRECTOMY Left   . POSTERIOR CERVICAL LAMINECTOMY Left 04/24/2015   Procedure: Foraminotomy cervical five - cervical six cervical six - cervical seven left;  Surgeon: Eustace Moore, MD;  Location: [facility] NEURO ORS;  Service: Neurosurgery;  Laterality: Left;  . REPLACEMENT TOTAL KNEE BILATERAL          Home Medications    Prior to Admission medications   Medication Sig Start Date End Date Taking? Authorizing Provider  albuterol (VENTOLIN HFA) 108 (90 Base) MCG/ACT inhaler Inhale 2 puffs into the lungs every 6 (six) hours as needed for wheezing or shortness of breath. 01/23/19  Yes Martyn Ehrich, NP  budesonide-formoterol [facility]) 160-4.5 MCG/ACT inhaler Inhale 2 puffs into the lungs 2 (two) times daily. 04/19/19  Yes Parrett, Tammy S, NP  busPIRone (BUSPAR) 5 MG tablet TAKE ONE TABLET BY MOUTH TWICE A DAY AS NEEDED ANXIETY Patient taking differently: Take 5 mg by mouth 2 (two) times daily as needed (anxiety).  05/04/19  Yes Marin Olp, MD  ELIQUIS 5 MG TABS tablet TAKE ONE TABLET BY MOUTH TWICE A DAY Patient taking differently: Take 5 mg by mouth 2 (two) times daily.  04/10/19  Yes Marin Olp, MD  escitalopram (LEXAPRO) 20 MG tablet Take 1 tablet (20 mg total) by mouth daily. 05/05/19  Yes Marin Olp, MD  furosemide (LASIX) 40 MG tablet Take 1-2 tablets (40-80 mg total) by mouth 2 (two) times daily. Patient taking differently: Take 40 mg by mouth 2 (two) times daily.  07/27/18  Yes Marin Olp, MD  gabapentin (NEURONTIN) 100 MG capsule Take 3 capsules (300 mg total) by mouth at bedtime. 05/05/19  Yes Marin Olp, MD  KLOR-CON M20 20 MEQ tablet TAKE ONE TABLET BY MOUTH TWICE A DAY Patient taking differently: Take 20 mEq by mouth 2 (two) times daily.  03/08/19  Yes Marin Olp, MD  levothyroxine (SYNTHROID) 150 MCG tablet TAKE ONE TABLET BY MOUTH DAILY Patient  taking differently: Take 150 mcg by mouth daily before breakfast.  04/10/19  Yes Marin Olp, MD  montelukast (SINGULAIR) 10 MG tablet Take 1 tablet (10 mg total) by mouth daily. 10/31/18  Yes Parrett, Fonnie Mu, NP  Multiple Vitamins-Minerals (MULTIVITAMINS THER. W/MINERALS) TABS Take 1 tablet by mouth daily.   Yes [provider]  nortriptyline (PAMELOR) 50 MG capsule Take 1 capsule (50 mg total) by mouth at bedtime. 05/11/19  Yes Marin Olp, MD  propranolol (INDERAL) 10 MG tablet TAKE ONE TABLET BY MOUTH TWICE A DAY Patient taking differently: Take 10 mg by mouth 2 (two) times daily.  05/11/19  Yes Marin Olp, MD  traMADol (ULTRAM) 50 MG tablet Take 1 tablet (50 mg total) by mouth every 12 (twelve) hours as needed for moderate pain. 05/05/19  Yes [name]  O, MD  traZODone (DESYREL) 50 MG tablet TAKE ONE-HALF TO 1 TABLET BY MOUTH AT BEDTIME AS NEEDED FOR SLEEP Patient taking differently: Take 25-50 mg by mouth at bedtime as needed for sleep.  11/14/18  Yes Marin Olp, MD  doxycycline (VIBRAMYCIN) 100 MG capsule Take 1 capsule (100 mg total) by mouth 2 (two) times daily. 05/29/19   Isla Pence, MD  predniSONE (STERAPRED UNI-PAK 21 TAB) 10 MG (21) TBPK tablet Take 6 tabs for 2 days, then 5 for 2 days, then 4 for 2 days, then 3 for 2 days, 2 for 2 days, then 1 for 2 days 05/29/19   Isla Pence, MD  Spacer/Aero-Holding Chambers (AEROCHAMBER MV) inhaler Use as instructed 01/11/19   Parrett, Fonnie Mu, NP    Family History Family History  Problem Relation Age of Onset  . Cancer Father        ?mouth, died before patient born  . Cancer Mother        stomach, died when he was young  . Kidney cancer Sister   . Colon cancer Brother 55  . Esophageal cancer Neg Hx   . Rectal cancer Neg Hx   . Stomach cancer Neg Hx     Social History Social History   Tobacco Use  . Smoking status: Former Smoker    Years: 4.00    Types: Cigars  . Smokeless tobacco:  Never Used  . Tobacco comment: smoked cigars 1-2x per year; QUIT 2 YEARS AGO   2017  Substance Use Topics  . Alcohol use: Yes    Alcohol/week: 2.0 standard drinks    Types: 2 Shots of liquor per week    Comment: socially  . Drug use: No     Allergies   Ambien [zolpidem]   Review of Systems Review of Systems  Respiratory: Positive for shortness of breath.   All other systems reviewed and are negative.    Physical Exam Updated Vital Signs BP 123/76   Pulse 91   Temp 99.2 F (37.3 C) (Oral)   Resp 17   Ht 5\' 11"  (1.803 m)   Wt 127 kg   SpO2 94%   BMI 39.05 kg/m   Physical Exam Vitals signs and nursing note reviewed.  Constitutional:      Appearance: He is well-developed.  HENT:     Head: Normocephalic.     Mouth/Throat:     Mouth: Mucous membranes are moist.     Pharynx: Oropharynx is clear.  Eyes:     Extraocular Movements: Extraocular movements intact.     Pupils: Pupils are equal, round, and reactive to light.  Neck:     Musculoskeletal: Normal range of motion and neck supple.  Cardiovascular:     Rate and Rhythm: Normal rate and regular rhythm.  Pulmonary:     Effort: Pulmonary effort is normal.     Breath sounds: Wheezing present.  Abdominal:     General: Bowel sounds are normal.     Palpations: Abdomen is soft.  Musculoskeletal: Normal range of motion.  Skin:    General: Skin is warm.     Capillary Refill: Capillary refill takes less than 2 seconds.  Neurological:     General: No focal deficit present.     Mental Status: He is alert and oriented to person, place, and time.  Psychiatric:        Mood and Affect: Mood is anxious.        Behavior: Behavior normal.  ED Treatments / Results  Labs (all labs ordered are listed, but only abnormal results are displayed) Labs Reviewed  BASIC METABOLIC PANEL - Abnormal; Notable for the following components:      Result Value   Glucose, Bld 121 (*)    All other components within normal limits   CBC WITH DIFFERENTIAL/PLATELET - Abnormal; Notable for the following components:   WBC 11.5 (*)    Neutro Abs 8.2 (*)    Monocytes Absolute 1.2 (*)    Eosinophils Absolute 0.7 (*)    Abs Immature Granulocytes 0.08 (*)    All other components within normal limits  LACTIC ACID, PLASMA - Abnormal; Notable for the following components:   Lactic Acid, Venous 2.4 (*)    All other components within normal limits  D-DIMER, QUANTITATIVE (NOT AT [facility]) - Abnormal; Notable for the following components:   D-Dimer, Quant 1.29 (*)    All other components within normal limits  CULTURE, BLOOD (ROUTINE X 2)  CULTURE, BLOOD (ROUTINE X 2)  BRAIN NATRIURETIC PEPTIDE  LACTIC ACID, PLASMA  TROPONIN I (HIGH SENSITIVITY)  TROPONIN I (HIGH SENSITIVITY)    EKG EKG Interpretation  Date/Time:  Monday May 29 2019 14:46:20 EST Ventricular Rate:  88 PR Interval:    QRS Duration: 91 QT Interval:  371 QTC Calculation: 449 R Axis:   -31 Text Interpretation: Sinus rhythm Borderline prolonged PR interval Left axis deviation Low voltage, precordial leads Baseline wander in lead(s) V3 V6 No significant change since last tracing Confirmed by Isla Pence [phone]) on 05/29/2019 3:38:16 PM   Radiology Ct Angio Chest Pe W And/or Wo Contrast  Result Date: 05/29/2019 CLINICAL DATA:  Shortness of breath, covid positive EXAM: CT ANGIOGRAPHY CHEST WITH CONTRAST TECHNIQUE: Multidetector CT imaging of the chest was performed using the standard protocol during bolus administration of intravenous contrast. Multiplanar CT image reconstructions and MIPs were obtained to evaluate the vascular anatomy. CONTRAST:  174mL OMNIPAQUE IOHEXOL 350 MG/ML SOLN COMPARISON:  January 04, 2019 FINDINGS: Cardiovascular: There is a optimal opacification of the pulmonary arteries. There is no central,segmental, or subsegmental filling defects within the pulmonary arteries. There is mild cardiomegaly. No pericardial effusion or thickening. No  evidence right heart strain. There is normal three-vessel brachiocephalic anatomy without proximal stenosis. Coronary artery calcifications are seen. There is scattered minimal aortic atherosclerosis seen. Mediastinum/Nodes: Scattered mediastinal lymph nodes are noted the largest within the prevascular space measuring 1.1 cm and right hilar, measuring 1.1 cm. Thyroid gland, trachea, and esophagus demonstrate no significant findings. Lungs/Pleura: There is scattered hazy ground-glass opacity seen in the posterior bilateral lower lungs and the right upper lung. There is a small left and trace right pleural effusion present. There is also more patchy airspace opacity seen at the posterior left lower lung. Upper Abdomen: No acute abnormalities present in the visualized portions of the upper abdomen. Musculoskeletal: No chest wall abnormality. No acute or significant osseous findings. Review of the MIP images confirms the above findings. IMPRESSION: 1. No central, segmental, or subsegmental pulmonary embolism. 2. Patchy/hazy ground-glass opacity seen throughout both lungs which is nonspecific but concerning for atypical infection, which includes viral pneumonia. 3. Small left and trace right pleural effusion 4. Scattered mediastinal and right hilar lymphadenopathy, likely reactive 5. Mild cardiomegaly 6.  Aortic Atherosclerosis (ICD10-I70.0). Electronically Signed   By: Prudencio Pair M.D.   On: 05/29/2019 19:19   Dg Chest Port 1 View  Result Date: 05/29/2019 CLINICAL DATA:  Shortness of breath. EXAM: PORTABLE CHEST 1 VIEW COMPARISON:  April 25, 2019. FINDINGS: Stable cardiomegaly. No pneumothorax is noted. Left basilar opacity is noted concerning for atelectasis or infiltrate. Small right upper lobe opacity is noted concerning for possible pneumonia. Bony thorax is unremarkable. No significant pleural effusion is noted. IMPRESSION: Stable left basilar opacity is noted concerning for atelectasis or infiltrate. Small  right upper lobe opacity is noted concerning for possible pneumonia. Electronically Signed   By: Marijo Conception M.D.   On: 05/29/2019 16:22    Procedures Procedures (including critical care time)  Medications Ordered in ED Medications  sodium chloride (PF) 0.9 % injection (has no administration in time range)  LORazepam (ATIVAN) injection 1 mg (1 mg Intravenous Given 05/29/19 1629)  iohexol (OMNIPAQUE) 350 MG/ML injection 100 mL (100 mLs Intravenous Contrast Given 05/29/19 1848)     Initial Impression / Assessment and Plan / ED Course  I have reviewed the triage vital signs and the nursing notes.  Pertinent labs & imaging results that were available during my care of the patient were reviewed by me and considered in my medical decision making (see chart for details).   Pt is oxygenating 94-95%.  He has oxygen at home that he can use if needed.  He does not have a PE.  He does have possible pna, but was hospitalized at [facility] for covid pneumonia.  This is likely residual.  The pt said he feels so weak and wants some medicine to stop the weakness.  He said he has pt/ot at home, but it is not helping.  As pt is not hypoxic, he does not meet inpatient admission.  He is not happy as he wants to stay the night to "get strong again."  I tried to explain why that would not help, but he told me to get out of the room.  He's "heard enough."  The pt will be d/c home with prednisone and doxy.  He knows that he is welcome to return at any time.  F/u with pcp.  DASON BURGERT was evaluated in Emergency Department on 05/29/2019 for the symptoms described in the history of present illness. He was evaluated in the context of the global COVID-19 pandemic, which necessitated consideration that the patient might be at risk for infection with the SARS-CoV-2 virus that causes COVID-19. Institutional protocols and algorithms that pertain to the evaluation of patients at risk for COVID-19 are in a state of  rapid change based on information released by regulatory bodies including the CDC and federal and state organizations. These policies and algorithms were followed during the patient's care in the ED.  Final Clinical Impressions(s) / ED Diagnoses   Final diagnoses:  Shortness of breath  Pneumonia due to COVID-19 virus    ED Discharge Orders         Ordered    doxycycline (VIBRAMYCIN) 100 MG capsule  2 times daily     05/29/19 2001    predniSONE (STERAPRED UNI-PAK 21 TAB) 10 MG (21) TBPK tablet     05/29/19 Encarnacion Chu, MD 05/29/19 2004

## 2019-05-29 NOTE — ED Triage Notes (Signed)
Patient presents with SOB- patient tested positive for Covid 19 on 04/20/19. Patient unaware he tested positive. Patient reports feeling SOB approx 1 week ago, progressively getting worse. Patient reports last night his symptoms got "too bad to handle" and this morning, patient "couldn't catch my breath." Patient reports low fevers at home, denies diarrhea, endorses nausea, patient reports chest "tightness," denies smoking. Patient reports taking xarelto blood thinner. Patient reports ankle peripheral edema "worse than normal." Patient reports wearing 3-4L [place] O2 PRN for SOB at home. Recently, he has been staying at [facility] with no relief. Patient speaking in full sentences, but breathy. Patient refuses to lie in bed, states he  Must sit up in the chair "to catch my breath."

## 2019-05-29 NOTE — ED Notes (Signed)
An After Visit Summary was printed and given to the patient. Discharge instructions given to patient and no further questions at this time. Pt leaving with son in car, pt able to ambulate from wheelchair to get in car unassisted.

## 2019-05-29 NOTE — ED Notes (Signed)
Date and time results received: 05/29/19 1813 (use smartphrase ".now" to insert current time)  Test: Lactic acid Critical Value: 2.4  Name of Provider Notified:   Orders Received? Or Actions Taken?:waiting on orders

## 2019-05-31 DIAGNOSIS — Z905 Acquired absence of kidney: Secondary | ICD-10-CM

## 2019-05-31 DIAGNOSIS — J4540 Moderate persistent asthma, uncomplicated: Secondary | ICD-10-CM

## 2019-05-31 DIAGNOSIS — U071 COVID-19: Secondary | ICD-10-CM | POA: Diagnosis not present

## 2019-05-31 DIAGNOSIS — Z8546 Personal history of malignant neoplasm of prostate: Secondary | ICD-10-CM

## 2019-05-31 DIAGNOSIS — F329 Major depressive disorder, single episode, unspecified: Secondary | ICD-10-CM

## 2019-05-31 DIAGNOSIS — J9811 Atelectasis: Secondary | ICD-10-CM

## 2019-05-31 DIAGNOSIS — Z96659 Presence of unspecified artificial knee joint: Secondary | ICD-10-CM

## 2019-05-31 DIAGNOSIS — G4733 Obstructive sleep apnea (adult) (pediatric): Secondary | ICD-10-CM

## 2019-05-31 DIAGNOSIS — I480 Paroxysmal atrial fibrillation: Secondary | ICD-10-CM

## 2019-05-31 DIAGNOSIS — Z6839 Body mass index (BMI) 39.0-39.9, adult: Secondary | ICD-10-CM

## 2019-05-31 DIAGNOSIS — I2609 Other pulmonary embolism with acute cor pulmonale: Secondary | ICD-10-CM | POA: Diagnosis not present

## 2019-05-31 DIAGNOSIS — I5033 Acute on chronic diastolic (congestive) heart failure: Secondary | ICD-10-CM

## 2019-05-31 DIAGNOSIS — N1831 Chronic kidney disease, stage 3a: Secondary | ICD-10-CM

## 2019-05-31 DIAGNOSIS — Z9981 Dependence on supplemental oxygen: Secondary | ICD-10-CM

## 2019-05-31 DIAGNOSIS — I700 Atherosclerosis of aorta: Secondary | ICD-10-CM

## 2019-05-31 DIAGNOSIS — I2510 Atherosclerotic heart disease of native coronary artery without angina pectoris: Secondary | ICD-10-CM | POA: Diagnosis not present

## 2019-05-31 DIAGNOSIS — Z7901 Long term (current) use of anticoagulants: Secondary | ICD-10-CM

## 2019-05-31 DIAGNOSIS — Z9181 History of falling: Secondary | ICD-10-CM

## 2019-05-31 DIAGNOSIS — Z79891 Long term (current) use of opiate analgesic: Secondary | ICD-10-CM

## 2019-05-31 DIAGNOSIS — I130 Hypertensive heart and chronic kidney disease with heart failure and stage 1 through stage 4 chronic kidney disease, or unspecified chronic kidney disease: Secondary | ICD-10-CM

## 2019-05-31 DIAGNOSIS — Z85528 Personal history of other malignant neoplasm of kidney: Secondary | ICD-10-CM

## 2019-05-31 DIAGNOSIS — J9622 Acute and chronic respiratory failure with hypercapnia: Secondary | ICD-10-CM | POA: Diagnosis not present

## 2019-05-31 DIAGNOSIS — F419 Anxiety disorder, unspecified: Secondary | ICD-10-CM

## 2019-05-31 DIAGNOSIS — E039 Hypothyroidism, unspecified: Secondary | ICD-10-CM

## 2019-05-31 DIAGNOSIS — J1289 Other viral pneumonia: Secondary | ICD-10-CM | POA: Diagnosis not present

## 2019-05-31 DIAGNOSIS — Z7951 Long term (current) use of inhaled steroids: Secondary | ICD-10-CM

## 2019-05-31 DIAGNOSIS — E871 Hypo-osmolality and hyponatremia: Secondary | ICD-10-CM

## 2019-06-02 DIAGNOSIS — U071 COVID-19: Secondary | ICD-10-CM | POA: Diagnosis not present

## 2019-06-02 DIAGNOSIS — J1289 Other viral pneumonia: Secondary | ICD-10-CM | POA: Diagnosis not present

## 2019-06-02 DIAGNOSIS — I2609 Other pulmonary embolism with acute cor pulmonale: Secondary | ICD-10-CM | POA: Diagnosis not present

## 2019-06-02 DIAGNOSIS — I2510 Atherosclerotic heart disease of native coronary artery without angina pectoris: Secondary | ICD-10-CM | POA: Diagnosis not present

## 2019-06-02 DIAGNOSIS — J9622 Acute and chronic respiratory failure with hypercapnia: Secondary | ICD-10-CM | POA: Diagnosis not present

## 2019-06-03 LAB — CULTURE, BLOOD (ROUTINE X 2)
Culture: NO GROWTH
Culture: NO GROWTH
Special Requests: ADEQUATE
Special Requests: ADEQUATE

## 2019-06-04 DIAGNOSIS — G4733 Obstructive sleep apnea (adult) (pediatric): Secondary | ICD-10-CM | POA: Diagnosis not present

## 2019-06-05 DIAGNOSIS — J9622 Acute and chronic respiratory failure with hypercapnia: Secondary | ICD-10-CM | POA: Diagnosis not present

## 2019-06-05 DIAGNOSIS — J1289 Other viral pneumonia: Secondary | ICD-10-CM | POA: Diagnosis not present

## 2019-06-05 DIAGNOSIS — I2609 Other pulmonary embolism with acute cor pulmonale: Secondary | ICD-10-CM | POA: Diagnosis not present

## 2019-06-05 DIAGNOSIS — I2510 Atherosclerotic heart disease of native coronary artery without angina pectoris: Secondary | ICD-10-CM | POA: Diagnosis not present

## 2019-06-05 DIAGNOSIS — U071 COVID-19: Secondary | ICD-10-CM | POA: Diagnosis not present

## 2019-06-10 ENCOUNTER — Other Ambulatory Visit (HOSPITAL_COMMUNITY): Payer: Medicare Other

## 2019-06-12 ENCOUNTER — Telehealth: Payer: Self-pay | Admitting: Adult Health

## 2019-06-12 NOTE — Telephone Encounter (Signed)
ATC Jody but she did not answer and her VM is full. Will leave in triage so she can be contacted again later.

## 2019-06-13 ENCOUNTER — Ambulatory Visit: Payer: Medicare Other | Admitting: Adult Health

## 2019-06-13 NOTE — Telephone Encounter (Signed)
Spoke with Rexene Edison, NP,  and was advised to cancel PFT due to lack of covid test and potential of positive covid result if tested. Offer to keep appt if pt wants but cancel PFT.    Called and spoke to pt. Informed him of the recs per TP. Pt states he would prefer to cancel both PFT and OV and reschedule both later. Both have been rescheduled for early Feb. Informed pt to contact our office for any issues or concerns. Pt verbalized understanding and denied any further questions or concerns at this time.    PCCs please schedule COVID test for pt's upcoming PFT on 08/03/2018. Thank you!

## 2019-06-13 NOTE — Telephone Encounter (Signed)
We will get pt set up

## 2019-06-15 DIAGNOSIS — I2609 Other pulmonary embolism with acute cor pulmonale: Secondary | ICD-10-CM | POA: Diagnosis not present

## 2019-06-15 DIAGNOSIS — J9622 Acute and chronic respiratory failure with hypercapnia: Secondary | ICD-10-CM | POA: Diagnosis not present

## 2019-06-15 DIAGNOSIS — U071 COVID-19: Secondary | ICD-10-CM | POA: Diagnosis not present

## 2019-06-15 DIAGNOSIS — I2510 Atherosclerotic heart disease of native coronary artery without angina pectoris: Secondary | ICD-10-CM | POA: Diagnosis not present

## 2019-06-15 DIAGNOSIS — J1289 Other viral pneumonia: Secondary | ICD-10-CM | POA: Diagnosis not present

## 2019-06-19 ENCOUNTER — Telehealth: Payer: Self-pay | Admitting: Family Medicine

## 2019-06-19 ENCOUNTER — Telehealth: Payer: Self-pay | Admitting: Podiatry

## 2019-06-19 NOTE — Telephone Encounter (Signed)
Copied from [place] [id number]. Topic: General - Other >> Jun 19, 2019  3:02 PM Keene Breath wrote: Reason for CRM: Called to inform the doctor that the PT is sick with COVID and will have to move appt. To the next week.  CB# [phone]

## 2019-06-19 NOTE — Telephone Encounter (Signed)
Patient said he is out of the Gabepentin and his feet are aching and burning so bad, he couldn't hardly sleep. He said the Gabepentin didn't really help that much, but he needs something for the pain.

## 2019-06-19 NOTE — Telephone Encounter (Signed)
Appears to be duplicate message

## 2019-06-19 NOTE — Telephone Encounter (Signed)
Front office received a call from [place] from [place] [phone] stating that the patient has COVID and they had to move the PT appointment to next week. patient has an appointment with Dr. Yong Channel on Wednesday that I have made virtual. Please disregard the previous message as Wayne Booth has stated patietn has COVID. Patient was understanding to having the virtual appointment.   No further action required, patient sees Dr. Yong Channel on 12/23 virtually.

## 2019-06-19 NOTE — Telephone Encounter (Signed)
Patient had COVID-19 in [place] had lingering issues since that time-I am fine with him being seen in office-please offer this to patient if he prefers it

## 2019-06-19 NOTE — Telephone Encounter (Signed)
Please call to reschedule.

## 2019-06-19 NOTE — Telephone Encounter (Signed)
TALKED TO JENNA FROM [place] JUST WAS GIVING INFO THEY WERE MOVING PT OUT A WEEK SINCE PATIENT HAS COVID.

## 2019-06-19 NOTE — Telephone Encounter (Signed)
Front office received a call from [place] from [place] [phone] stating that the patient has COVID and they had to move the PT appointment to next week. patient has an appointment with Dr. Yong Channel on Wednesday that I have made virtual. Please disregard the previous message as Wayne Booth has stated patietn has COVID. Patient was understanding to having the virtual appointment.   No further action required, patient sees Dr. Yong Channel on 12/23 virtually.

## 2019-06-19 NOTE — Telephone Encounter (Signed)
Can you see if he is still on the gabapentin? We can switch to Lyrica if he hasn't been on that before.

## 2019-06-19 NOTE — Telephone Encounter (Signed)
Patient is scheduled for Wednesday due to patient's wife stating he is not doing well, still having difficulty breathing and swelling in feet. I explained that due to COVID guidelines we are not able to schedule in office visit however, I would send a message to Dr. Yong Channel to advise and see if it is an option. Dr. Yong Channel, please advise if he can come into the office (possible side door option) if you see fit. The reasoning for asking is the notes below last month regarding a similar occurrence.

## 2019-06-20 ENCOUNTER — Other Ambulatory Visit: Payer: Self-pay | Admitting: Family Medicine

## 2019-06-20 NOTE — Telephone Encounter (Signed)
Called l/m to let patient know he can come in office if he would like. Do you mind calling and following up if he will be in office or not?

## 2019-06-20 NOTE — Telephone Encounter (Signed)
Left message for pt to call with information to give Dr. Jacqualyn Posey.

## 2019-06-21 ENCOUNTER — Other Ambulatory Visit: Payer: Self-pay | Admitting: Family Medicine

## 2019-06-21 ENCOUNTER — Encounter: Payer: Self-pay | Admitting: Family Medicine

## 2019-06-21 ENCOUNTER — Ambulatory Visit (INDEPENDENT_AMBULATORY_CARE_PROVIDER_SITE_OTHER): Payer: Medicare Other | Admitting: Family Medicine

## 2019-06-21 VITALS — Wt 290.0 lb

## 2019-06-21 DIAGNOSIS — R413 Other amnesia: Secondary | ICD-10-CM

## 2019-06-21 DIAGNOSIS — I5033 Acute on chronic diastolic (congestive) heart failure: Secondary | ICD-10-CM | POA: Diagnosis not present

## 2019-06-21 DIAGNOSIS — L539 Erythematous condition, unspecified: Secondary | ICD-10-CM | POA: Diagnosis not present

## 2019-06-21 MED ORDER — TRAMADOL HCL 50 MG PO TABS: 50.0000 mg | ORAL_TABLET | Freq: Two times a day (BID) | ORAL | 1 refills | Status: DC | PRN

## 2019-06-21 MED ORDER — CEPHALEXIN 500 MG PO CAPS: 500.0000 mg | ORAL_CAPSULE | Freq: Three times a day (TID) | ORAL | 0 refills | Status: AC

## 2019-06-21 MED ORDER — FUROSEMIDE 40 MG PO TABS: 40.0000 mg | ORAL_TABLET | Freq: Two times a day (BID) | ORAL | 5 refills | Status: DC

## 2019-06-21 NOTE — Progress Notes (Signed)
Phone [phone] Virtual visit via Video note   Subjective:  Chief complaint: Chief Complaint  Patient presents with  . Leg Swelling   This visit type was conducted due to national recommendations for restrictions regarding the COVID-19 Pandemic (e.g. social distancing).  This format is felt to be most appropriate for this patient at this time balancing risks to patient and risks to population by having him in for in person visit.  No physical exam was performed (except for noted visual exam or audio findings with Telehealth visits).    Our team/I connected with Wayne Booth at  8:40 AM EST by a video enabled telemedicine application ([email or web address] or caregility through epic) and verified that I am speaking with the correct person using two identifiers.  Location patient: Home-[facility] Location provider: [facility], office Persons participating in the virtual visit:  patient  Our team/I discussed the limitations of evaluation and management by telemedicine and the availability of in person appointments. In light of current covid-19 pandemic, patient also understands that we are trying to protect them by minimizing in office contact if at all possible.  The patient expressed consent for telemedicine visit and agreed to proceed. Patient understands insurance will be billed.   ROS- no fever/chills. Still short of breath ever since had covid.  No chest pain  Past Medical History-  Patient Active Problem List   Diagnosis Date Noted  . Acute on chronic respiratory failure with hypoxia and hypercapnia (HCC) 04/20/2019    Priority: High  . Pneumonia due to COVID-19 virus 04/20/2019    Priority: High  . Memory loss 06/10/2018    Priority: High  . Chronic respiratory failure with hypoxia (HCC) 04/26/2018    Priority: High  . CAD (coronary artery disease) 04/09/2018    Priority: High  . CKD (chronic kidney disease) stage 3, GFR 30-59 ml/min     Priority: High  . (HFpEF) heart failure with  preserved ejection fraction ([place]) 12/16/2017    Priority: High  . Chest pain     Priority: High  . Atrial flutter ([place]) 11/01/2017    Priority: High  . Chronic pulmonary embolism ([place]) 10/23/2016    Priority: High  . Asthmatic bronchitis 06/09/2011    Priority: High  . History of prostate cancer-seed implantation 2010.  Dr. Alinda Money 10/02/2008    Priority: High  . History of renal cell carcinoma 12/26/2007    Priority: High  . PTSD (post-traumatic stress disorder) 07/27/2018    Priority: Medium  . HCAP (healthcare-associated pneumonia) 02/03/2018    Priority: Medium  . History of adenomatous polyp of colon 04/12/2017    Priority: Medium  . Cervical disc disease 11/13/2015    Priority: Medium  . Essential tremor 08/14/2015    Priority: Medium  . Gout 08/14/2015    Priority: Medium  . Hypothyroidism 07/12/2014    Priority: Medium  . Hyperglycemia 07/12/2014    Priority: Medium  . GAD (generalized anxiety disorder) 11/07/2013    Priority: Medium  . OSA (obstructive sleep apnea) 07/07/2011    Priority: Medium  . Morbid obesity ([place]) 10/02/2008    Priority: Medium  . Insomnia 12/26/2007    Priority: Medium  . Essential hypertension 12/28/2006    Priority: Medium  . Aortic atherosclerosis ([place]) 05/10/2018    Priority: Low  . First degree AV block 04/15/2018    Priority: Low  . Constipation 01/21/2018    Priority: Low  . Anticoagulated     Priority: Low  . Pain in left knee  07/15/2017    Priority: Low  . Bilateral foot pain 07/13/2017    Priority: Low  . Community acquired pneumonia 06/18/2017    Priority: Low  . Status post nephrectomy 06/09/2017    Priority: Low  . VTE (venous thromboembolism) 06/09/2017    Priority: Low  . Actinic keratosis 11/27/2009    Priority: Low  . Osteoarthritis 11/13/2008    Priority: Low  . TESTOSTERONE DEFICIENCY 12/28/2006    Priority: Low  . Hyponatremia 04/15/2018  . Pleural effusion 04/14/2018  . Chronic pain 04/09/2018  .  Dyspnea     Medications- reviewed and updated Current Outpatient Medications  Medication Sig Dispense Refill  . albuterol (VENTOLIN HFA) 108 (90 Base) MCG/ACT inhaler Inhale 2 puffs into the lungs every 6 (six) hours as needed for wheezing or shortness of breath. 6.7 g 2  . budesonide-formoterol (SYMBICORT) 160-4.5 MCG/ACT inhaler Inhale 2 puffs into the lungs 2 (two) times daily. 1 Inhaler 0  . busPIRone (BUSPAR) 5 MG tablet TAKE ONE TABLET BY MOUTH TWICE A DAY AS NEEDED FOR ANXIETY 30 tablet 3  . ELIQUIS 5 MG TABS tablet TAKE ONE TABLET BY MOUTH TWICE A DAY (Patient taking differently: Take 5 mg by mouth 2 (two) times daily. ) 60 tablet 10  . escitalopram (LEXAPRO) 20 MG tablet Take 1 tablet (20 mg total) by mouth daily. 90 tablet 1  . furosemide (LASIX) 40 MG tablet Take 1-2 tablets (40-80 mg total) by mouth 2 (two) times daily. 120 tablet 5  . gabapentin (NEURONTIN) 100 MG capsule Take 3 capsules (300 mg total) by mouth at bedtime. 90 capsule 5  . KLOR-CON M20 20 MEQ tablet TAKE ONE TABLET BY MOUTH TWICE A DAY (Patient taking differently: Take 20 mEq by mouth 2 (two) times daily. ) 120 tablet 0  . levothyroxine (SYNTHROID) 150 MCG tablet TAKE ONE TABLET BY MOUTH DAILY (Patient taking differently: Take 150 mcg by mouth daily before breakfast. ) 90 tablet 0  . montelukast (SINGULAIR) 10 MG tablet Take 1 tablet (10 mg total) by mouth daily. 30 tablet 11  . Multiple Vitamins-Minerals (MULTIVITAMINS THER. W/MINERALS) TABS Take 1 tablet by mouth daily.    . nortriptyline (PAMELOR) 50 MG capsule Take 1 capsule (50 mg total) by mouth at bedtime. 90 capsule 1  . propranolol (INDERAL) 10 MG tablet TAKE ONE TABLET BY MOUTH TWICE A DAY (Patient taking differently: Take 10 mg by mouth 2 (two) times daily. ) 120 tablet 0  . Spacer/Aero-Holding Chambers (AEROCHAMBER MV) inhaler Use as instructed 1 each 0  . traMADol (ULTRAM) 50 MG tablet Take 1 tablet (50 mg total) by mouth every 12 (twelve) hours as needed  for moderate pain. 30 tablet 1  . traZODone (DESYREL) 50 MG tablet TAKE ONE-HALF TO 1 TABLET BY MOUTH AT BEDTIME AS NEEDED FOR SLEEP (Patient taking differently: Take 25-50 mg by mouth at bedtime as needed for sleep. ) 90 tablet 3  . cephALEXin (KEFLEX) 500 MG capsule Take 1 capsule (500 mg total) by mouth 3 (three) times daily for 7 days. 21 capsule 0   No current facility-administered medications for this visit.     Objective:  Wt 290 lb (131.5 kg)   BMI 40.45 kg/m  self reported vitals Gen: NAD, resting comfortably while wearing oxygen Lungs: nonlabored, normal respiratory rate  Skin: over lower legs- appears to have significant edema as well as redness to mid shin- has some drainage on left leg that is clear    Assessment and Plan   #  CHF exacerbation #redness of legs S: Patient states for the last several weeks he feels like his ankles have had increased swelling.  Over the last few days has noted redness on bilateral lower legs as well as legs continue to swell.  In the past when he has edema he also experiences moderate to severe pain in his feet.  He has been noticing that recently.  Weight appears to have increased about 10 pounds over the last 3 weeks 2 weeks and getting worse.   No increased shortness of breath.  He has had continued issues with breathing since COVID-19 in October and he is on oxygen today A/P: Patient appears to have CHF exacerbation-we will increase furosemide to 80 mg in the morning and 40 mg in the evening (from 40 mg BID) for 1 week-if symptoms have resolved he may go back to twice a day-if persistent he may continue the higher dose and see me back in 2 weeks.  If he has new or worsening symptoms should seek care. -Wife is very concerned about potential skin infection.  With bilateral nature of redness I do not strongly suspect infection-encouraged him to try to increase Lasix for a few days unless symptoms worsen-if symptoms worsen he can start Keflex which  was sent in today to cover for potential cellulitis.  He also can start this if he fails to improve after several days. -Patient is very concerned about pain control.  I recommended Tylenol 650 mg every 6 hours.  If this fails to control his pain and pain gets above approximately a 7 out of 10 I agreed he could use tramadol which was refilled today-we want to use this sparingly with serotonin risk  #Memory loss S: Patient has had issues with his memory after his multiple hospitalizations over the last 2 years.  Had an MRI of the brain January 2020 which was largely unremarkable.  B12 levels have been normal.  Depression appears more adequately treated with PHQ9 under 5. A/P: I wonder if patient may have mild cognitive impairment.  I would like to get an MMSE next time he is in the office to establish a baseline.  We could consider neurology referral as well  Recommended follow up: 2 week follow up (team will call to recommend Future Appointments  Date Time Provider [place]  07/12/2019  2:00 PM Jettie Booze, MD [facility]  07/19/2019 10:30 AM [facility] NURSE [facility]  08/04/2019  2:00 PM [facility] None  08/04/2019  3:00 PM Parrett, Tammy S, NP [facility] None   Lab/Order associations:   ICD-10-CM   1. Acute on chronic diastolic congestive heart failure (HCC)  I50.33   2. Memory loss  R41.3   3. Erythema of skin  L53.9     Meds ordered this encounter  Medications  . furosemide (LASIX) 40 MG tablet    Sig: Take 1-2 tablets (40-80 mg total) by mouth 2 (two) times daily.    Dispense:  120 tablet    Refill:  5  . cephALEXin (KEFLEX) 500 MG capsule    Sig: Take 1 capsule (500 mg total) by mouth 3 (three) times daily for 7 days.    Dispense:  21 capsule    Refill:  0  . traMADol (ULTRAM) 50 MG tablet    Sig: Take 1 tablet (50 mg total) by mouth every 12 (twelve) hours as needed for moderate pain.    Dispense:  30 tablet    Refill:  1  Return precautions advised.  Wayne Reddish, MD

## 2019-06-21 NOTE — Patient Instructions (Addendum)
There are no preventive care reminders to display for this patient.

## 2019-06-22 NOTE — Progress Notes (Signed)
Pt scheduled for 07/19/19

## 2019-06-28 DIAGNOSIS — I2609 Other pulmonary embolism with acute cor pulmonale: Secondary | ICD-10-CM | POA: Diagnosis not present

## 2019-06-28 DIAGNOSIS — I2510 Atherosclerotic heart disease of native coronary artery without angina pectoris: Secondary | ICD-10-CM | POA: Diagnosis not present

## 2019-06-28 DIAGNOSIS — J1289 Other viral pneumonia: Secondary | ICD-10-CM | POA: Diagnosis not present

## 2019-06-28 DIAGNOSIS — J9622 Acute and chronic respiratory failure with hypercapnia: Secondary | ICD-10-CM | POA: Diagnosis not present

## 2019-06-28 DIAGNOSIS — U071 COVID-19: Secondary | ICD-10-CM | POA: Diagnosis not present

## 2019-07-03 NOTE — Telephone Encounter (Signed)
I would start Lyrica 75mg  BID disp #60 with 2 refills. If he is on gabapentin we should wean him off and then start the lyrica. Thanks.

## 2019-07-05 DIAGNOSIS — G4733 Obstructive sleep apnea (adult) (pediatric): Secondary | ICD-10-CM | POA: Diagnosis not present

## 2019-07-06 MED ORDER — PREGABALIN 75 MG PO CAPS: 75.0000 mg | ORAL_CAPSULE | Freq: Two times a day (BID) | ORAL | 2 refills | Status: DC

## 2019-07-06 NOTE — Telephone Encounter (Signed)
[name] - Levada Dy, pharmacist states she was not able to find our office address with the phone number provided.

## 2019-07-06 NOTE — Addendum Note (Signed)
Addended by: Harriett Sine D on: 07/06/2019 09:56 AM   Modules accepted: Orders

## 2019-07-06 NOTE — Telephone Encounter (Signed)
Left message for pt to call for new orders from Dr. Jacqualyn Posey.

## 2019-07-06 NOTE — Telephone Encounter (Signed)
Pt called and I informed of Dr. Leigh Aurora 07/03/2019 orders and pt accepted. Orders called to [facility] - [phone].

## 2019-07-06 NOTE — Telephone Encounter (Signed)
I informed Levada Dy, Pharmacist of our [address], [place] address.

## 2019-07-12 ENCOUNTER — Ambulatory Visit: Payer: Medicare Other | Admitting: Interventional Cardiology

## 2019-07-12 ENCOUNTER — Other Ambulatory Visit: Payer: Self-pay

## 2019-07-12 ENCOUNTER — Encounter: Payer: Self-pay | Admitting: Interventional Cardiology

## 2019-07-12 VITALS — BP 132/72 | HR 91 | Ht 71.0 in | Wt 298.4 lb

## 2019-07-12 DIAGNOSIS — R0602 Shortness of breath: Secondary | ICD-10-CM

## 2019-07-12 DIAGNOSIS — R0600 Dyspnea, unspecified: Secondary | ICD-10-CM

## 2019-07-12 DIAGNOSIS — R0789 Other chest pain: Secondary | ICD-10-CM | POA: Diagnosis not present

## 2019-07-12 DIAGNOSIS — I480 Paroxysmal atrial fibrillation: Secondary | ICD-10-CM

## 2019-07-12 DIAGNOSIS — R072 Precordial pain: Secondary | ICD-10-CM

## 2019-07-12 DIAGNOSIS — R0609 Other forms of dyspnea: Secondary | ICD-10-CM

## 2019-07-12 NOTE — Patient Instructions (Addendum)
Medication Instructions:  Your physician recommends that you continue on your current medications as directed. Please refer to the Current Medication list given to you today.  *If you need a refill on your cardiac medications before your next appointment, please call your pharmacy*  Lab Work: TODAY: CBC, BMET  If you have labs (blood work) drawn today and your tests are completely normal, you will receive your results only by: Marland Kitchen MyChart Message (if you have MyChart) OR . A paper copy in the mail If you have any lab test that is abnormal or we need to change your treatment, we will call you to review the results.  Testing/Procedures: Your physician has requested that you have a cardiac catheterization. Cardiac catheterization is used to diagnose and/or treat various heart conditions. Doctors may recommend this procedure for a number of different reasons. The most common reason is to evaluate chest pain. Chest pain can be a symptom of coronary artery disease (CAD), and cardiac catheterization can show whether plaque is narrowing or blocking your heart's arteries. This procedure is also used to evaluate the valves, as well as measure the blood flow and oxygen levels in different parts of your heart. For further information please visit [email or web address]. Please follow instruction sheet, as given.   Follow-Up: Follow up with Dr. Irish Lack on 07/31/19 at 11:40 AM  Other Instructions    [facility] OFFICE [place], [place] [address] Dept: [place]: [place]  07/12/2019  You are scheduled for a Cardiac Catheterization on Friday, January 15 with Dr. Larae Grooms.  1. Please arrive at the [facility] (Main Entrance A) at [facility]: [address] [place], [place] [address] at 8:30 AM (This time is two hours before your procedure to ensure your  preparation). Free valet parking service is available.   Special note: Every effort is made to have your procedure done on time. Please understand that emergencies sometimes delay scheduled procedures.  2. Diet: Do not eat solid foods after midnight.  The patient may have clear liquids until 5am upon the day of the procedure.  3. Labs: TODAY: CBC, BMET  Positive COVID test on 04/19/20. Do not test for 90 days.   4. Medication instructions in preparation for your procedure:   Contrast Allergy: No  DO NOT take furosemide (lasix) the morning of the procedure.  DO NOT take Eliquis for 2 days prior to procedure  On the morning of your procedure, take a baby Aspirin 81 mg and any morning medicines NOT listed above.  You may use sips of water.  5. Plan for one night stay--bring personal belongings. 6. Bring a current list of your medications and current insurance cards. 7. You MUST have a responsible person to drive you home. 8. Someone MUST be with you the first 24 hours after you arrive home or your discharge will be delayed. 9. Please wear clothes that are easy to get on and off and wear slip-on shoes.  Thank you for allowing [place] to care for you!   -- [place] [facility]

## 2019-07-12 NOTE — H&P (View-Only) (Signed)
Cardiology Office Note   Date:  07/12/2019   ID:  Wayne, Booth Nov 21, 1940, MRN [id number]  PCP:  Marin Olp, MD    No chief complaint on file.  Abnormal stress test  Wt Readings from Last 3 Encounters:  07/12/19 298 lb 6.4 oz (135.4 kg)  06/21/19 290 lb (131.5 kg)  05/29/19 280 lb (127 kg)       History of Present Illness: Wayne Booth is a 79 y.o. male  With obesity and OSA. He has had HTN as well. He has had dizziness intermittently for years, but it got worse in early Dec 2017. His BP meds were reduced and he has had no further episodes. Dizziness would occur would when going from sitting to standing. No syncope.   He had a cath in 4/08 that was normal.   He had hand surgeryin the pastand then a few weeks later, was diagnosed with PE. He was started on Xarelto.  Diagnosed with atrial flutter in 5/19. Hospitalized and converted to NSR. He was discharged on Eliquis.   Admitted in June with AFib with RVR.  His watch was alerting him of a fast heart rate.  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 -Had intermittent episode of atrial flutter on 6/10, metoprolol was added to the regimen, remains in normal sinus rhythm -Cardiology recommended continue diltiazem increased to240mg  XLdaily, metoprolol 12.5 mg twice a day, Eliquis 5 mg twice a day  Chest pain was thought to be due to AFib.  He had COVID in 03/2019 and was hospitalized for 8 days.  Troponins were negative in 04/2019.   He also had pneumonia.   He is using oxygen intermittently.    He has had chest pain when his oxygen sats are at 80%.      Past Medical History:  Diagnosis Date  . Allergy    States his nose runs when he is around grease.   Marland Kitchen Anxiety   . Arthritis   . Atrial fibrillation ([place])   . Cancer  ([place])   . Coronary artery disease   . History of total knee replacement   . Hypertension   . Hypothyroidism   . Kidney cancer, primary, with metastasis from kidney to other site [facility])   . Kidney stone   . OSA (obstructive sleep apnea)    pt does not wear cpap at night  . Pituitary cyst ([place])   . Pneumonia    hx of  . Prostate cancer [facility])     Past Surgical History:  Procedure Laterality Date  . ABLATION OF DYSRHYTHMIC FOCUS  01/28/2018  . ANTERIOR CERVICAL DECOMP/DISCECTOMY FUSION N/A 08/29/2012   Procedure: ANTERIOR CERVICAL DECOMPRESSION/DISCECTOMY FUSION 1 LEVEL;  Surgeon: Eustace Moore, MD;  Location: [facility] NEURO ORS;  Service: Neurosurgery;  Laterality: N/A;  . APPENDECTOMY    . ATRIAL FIBRILLATION ABLATION N/A 01/28/2018   Procedure: ATRIAL FIBRILLATION ABLATION;  Surgeon: Constance Haw, MD;  Location: [facility] CV LAB;  Service: Cardiovascular;  Laterality: N/A;  . CARDIAC CATHETERIZATION  2008   clean  . CHOLECYSTECTOMY    . COLONOSCOPY W/ POLYPECTOMY    . CRANIOTOMY N/A 11/08/2012   Procedure: CRANIOTOMY HYPOPHYSECTOMY TRANSNASAL APPROACH;  Surgeon: Faythe Ghee, MD;  Location: [facility] NEURO ORS;  Service: Neurosurgery;  Laterality: N/A;  Transphenoidal Resection of Pituitary Tumor  .  FINGER SURGERY Left 2017   Dr Amedeo Plenty  . INSERTION PROSTATE RADIATION SEED    . IR THORACENTESIS ASP PLEURAL SPACE W/IMG GUIDE  04/15/2018  . KIDNEY STONE SURGERY    . KNEE ARTHROSCOPY  2007   left  . NEPHRECTOMY Left   . POSTERIOR CERVICAL LAMINECTOMY Left 04/24/2015   Procedure: Foraminotomy cervical five - cervical six cervical six - cervical seven left;  Surgeon: Eustace Moore, MD;  Location: [facility]UR[facility]S;  Service: Neurosurgery;  Laterality: Left;  . REPLACEMENT TOTAL KNEE BILATERAL       Current Outpatient Medications  Medication Sig Dispense Refill  . albuterol (VENTOLIN HFA) 108 (90 Base) MCG/ACT inhaler Inhale 2 puffs into the lungs every 6 (six) hours as needed for wheezing  or shortness of breath. 6.7 g 2  . budesonide-formoterol (SYMBICORT) 160-4.5 MCG/ACT inhaler Inhale 2 puffs into the lungs 2 (two) times daily. 1 Inhaler 0  . busPIRone (BUSPAR) 5 MG tablet TAKE ONE TABLET BY MOUTH TWICE A DAY AS NEEDED FOR ANXIETY 30 tablet 3  . ELIQUIS 5 MG TABS tablet TAKE ONE TABLET BY MOUTH TWICE A DAY 60 tablet 10  . escitalopram (LEXAPRO) 20 MG tablet Take 1 tablet (20 mg total) by mouth daily. 90 tablet 1  . furosemide (LASIX) 40 MG tablet Take 1-2 tablets (40-80 mg total) by mouth 2 (two) times daily. 120 tablet 5  . gabapentin (NEURONTIN) 100 MG capsule Take 3 capsules (300 mg total) by mouth at bedtime. 90 capsule 5  . KLOR-CON M20 20 MEQ tablet TAKE ONE TABLET BY MOUTH TWICE A DAY 120 tablet 0  . levothyroxine (SYNTHROID) 150 MCG tablet TAKE ONE TABLET BY MOUTH DAILY 90 tablet 0  . montelukast (SINGULAIR) 10 MG tablet Take 1 tablet (10 mg total) by mouth daily. 30 tablet 11  . Multiple Vitamins-Minerals (MULTIVITAMINS THER. W/MINERALS) TABS Take 1 tablet by mouth daily.    . nortriptyline (PAMELOR) 50 MG capsule Take 1 capsule (50 mg total) by mouth at bedtime. 90 capsule 1  . pregabalin (LYRICA) 75 MG capsule Take 1 capsule (75 mg total) by mouth 2 (two) times daily. 60 capsule 2  . propranolol (INDERAL) 10 MG tablet TAKE ONE TABLET BY MOUTH TWICE A DAY 120 tablet 0  . Spacer/Aero-Holding Chambers (AEROCHAMBER MV) inhaler Use as instructed 1 each 0  . traMADol (ULTRAM) 50 MG tablet Take 1 tablet (50 mg total) by mouth every 12 (twelve) hours as needed for moderate pain. 30 tablet 1  . traZODone (DESYREL) 50 MG tablet TAKE ONE-HALF TO 1 TABLET BY MOUTH AT BEDTIME AS NEEDED FOR SLEEP 90 tablet 3   No current facility-administered medications for this visit.    Allergies:   Ambien [zolpidem]    Social History:  The patient  reports that he has quit smoking. His smoking use included cigars. He quit after 4.00 years of use. He has never used smokeless tobacco. He  reports current alcohol use of about 2.0 standard drinks of alcohol per week. He reports that he does not use drugs.   Family History:  The patient's family history includes Cancer in his father and mother; Colon cancer (age of onset: 75) in his brother; Kidney cancer in his sister.    ROS:  Please see the history of present illness.   Otherwise, review of systems are positive for DOE, fatigue.   All other systems are reviewed and negative.    PHYSICAL EXAM: VS:  BP 132/72   Pulse 91  Ht 5\' 11"  (1.803 m)   Wt 298 lb 6.4 oz (135.4 kg)   SpO2 97%   BMI 41.62 kg/m  , BMI Body mass index is 41.62 kg/m. GEN: Well nourished, well developed, in no acute distress  HEENT: normal  Neck: no JVD, carotid bruits, or masses Cardiac: RRR; no murmurs, rubs, or gallops,no edema  Respiratory:  clear to auscultation bilaterally, normal work of breathing GI: soft, nontender, nondistended, + BS MS: no deformity or atrophy  Skin: warm and dry, no rash Neuro:  Strength and sensation are intact Psych: euthymic mood, full affect   EKG:   The ekg ordered today demonstrates NSR, poor R wave progression, prolonged PR interval   Recent Labs: 03/15/2019: Pro B Natriuretic peptide (BNP) 31.0 04/20/2019: Magnesium 2.1 04/27/2019: ALT 62 05/29/2019: B Natriuretic Peptide 55.3; BUN 19; Creatinine, Ser 1.16; Hemoglobin 15.8; Platelets 334; Potassium 3.9; Sodium 137   Lipid Panel    Component Value Date/Time   CHOL 141 04/12/2017 0833   TRIG 100 02/18/2018 1021   HDL 38.20 (L) 04/12/2017 0833   CHOLHDL 4 04/12/2017 0833   VLDL 33.6 04/12/2017 0833   LDLCALC 69 04/12/2017 0833   LDLDIRECT 62.0 09/07/2006 0920     Other studies Reviewed: Additional studies/ records that were reviewed today with results demonstrating: prior abnormal stress test.   ASSESSMENT AND PLAN:  1. AFib: Maintaining NSR. Eliquis for stroke prevention.  2. DOE: He has oxygen but does not use it.    He has pulmonary f/u.   3. Chest pain:  It is concerning for angina.  Plan for cath as it is now occurring with minimal exertion in the house.  4. Obesity: Will benefit from weight loss long term. Cardiac catheterization was discussed with the patient fully. The patient understands that risks include but are not limited to stroke (1 in 1000), death (1 in 93), kidney failure [usually temporary] (1 in 500), bleeding (1 in 200), allergic reaction [possibly serious] (1 in 200).  The patient understands and is willing to proceed.      Current medicines are reviewed at length with the patient today.  The patient concerns regarding his medicines were addressed.  The following changes have been made:  No change  Labs/ tests ordered today include:  No orders of the defined types were placed in this encounter.   Recommend 150 minutes/week of aerobic exercise Low fat, low carb, high fiber diet recommended  Disposition:   FU in post cath   Signed, Larae Grooms, MD  07/12/2019 2:19 PM    [facility] [place], [place], [place]  [address] Phone: [phone]; Fax: [phone]

## 2019-07-12 NOTE — Progress Notes (Signed)
Cardiology Office Note   Date:  07/12/2019   ID:  Booth, Wayne 10-29-1940, MRN [id number]  PCP:  Marin Olp, MD    No chief complaint on file.  Abnormal stress test  Wt Readings from Last 3 Encounters:  07/12/19 298 lb 6.4 oz (135.4 kg)  06/21/19 290 lb (131.5 kg)  05/29/19 280 lb (127 kg)       History of Present Illness: Wayne Booth is a 79 y.o. male  With obesity and OSA. He has had HTN as well. He has had dizziness intermittently for years, but it got worse in early Dec 2017. His BP meds were reduced and he has had no further episodes. Dizziness would occur would when going from sitting to standing. No syncope.   He had a cath in 4/08 that was normal.   He had hand surgeryin the pastand then a few weeks later, was diagnosed with PE. He was started on Xarelto.  Diagnosed with atrial flutter in 5/19. Hospitalized and converted to NSR. He was discharged on Eliquis.   Admitted in June with AFib with RVR.  His watch was alerting him of a fast heart rate.  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 -Had intermittent episode of atrial flutter on 6/10, metoprolol was added to the regimen, remains in normal sinus rhythm -Cardiology recommended continue diltiazem increased to240mg  XLdaily, metoprolol 12.5 mg twice a day, Eliquis 5 mg twice a day  Chest pain was thought to be due to AFib.  He had COVID in 03/2019 and was hospitalized for 8 days.  Troponins were negative in 04/2019.   He also had pneumonia.   He is using oxygen intermittently.    He has had chest pain when his oxygen sats are at 80%.      Past Medical History:  Diagnosis Date  . Allergy    States his nose runs when he is around grease.   Marland Kitchen Anxiety   . Arthritis   . Atrial fibrillation ([place])   . Cancer  ([place])   . Coronary artery disease   . History of total knee replacement   . Hypertension   . Hypothyroidism   . Kidney cancer, primary, with metastasis from kidney to other site [facility])   . Kidney stone   . OSA (obstructive sleep apnea)    pt does not wear cpap at night  . Pituitary cyst ([place])   . Pneumonia    hx of  . Prostate cancer [facility])     Past Surgical History:  Procedure Laterality Date  . ABLATION OF DYSRHYTHMIC FOCUS  01/28/2018  . ANTERIOR CERVICAL DECOMP/DISCECTOMY FUSION N/A 08/29/2012   Procedure: ANTERIOR CERVICAL DECOMPRESSION/DISCECTOMY FUSION 1 LEVEL;  Surgeon: Eustace Moore, MD;  Location: [place] [facility];  Service: Neurosurgery;  Laterality: N/A;  . APPENDECTOMY    . ATRIAL FIBRILLATION ABLATION N/A 01/28/2018   Procedure: ATRIAL FIBRILLATION ABLATION;  Surgeon: Constance Haw, MD;  Location: [place] [facility];  Service: Cardiovascular;  Laterality: N/A;  . CARDIAC CATHETERIZATION  2008   clean  . CHOLECYSTECTOMY    . COLONOSCOPY W/ POLYPECTOMY    . CRANIOTOMY N/A 11/08/2012   Procedure: CRANIOTOMY HYPOPHYSECTOMY TRANSNASAL APPROACH;  Surgeon: Faythe Ghee, MD;  Location: [place] [facility] OR[facility];  Service: Neurosurgery;  Laterality: N/A;  Transphenoidal Resection of Pituitary Tumor  .  FINGER SURGERY Left 2017   Dr Amedeo Plenty  . INSERTION PROSTATE RADIATION SEED    . IR THORACENTESIS ASP PLEURAL SPACE W/IMG GUIDE  04/15/2018  . KIDNEY STONE SURGERY    . KNEE ARTHROSCOPY  2007   left  . NEPHRECTOMY Left   . POSTERIOR CERVICAL LAMINECTOMY Left 04/24/2015   Procedure: Foraminotomy cervical five - cervical six cervical six - cervical seven left;  Surgeon: Eustace Moore, MD;  Location: [facility];  Service: Neurosurgery;  Laterality: Left;  . REPLACEMENT TOTAL KNEE BILATERAL       Current Outpatient Medications  Medication Sig Dispense Refill  . albuterol (VENTOLIN HFA) 108 (90 Base) MCG/ACT inhaler Inhale 2 puffs into the lungs every 6 (six) hours as needed for wheezing  or shortness of breath. 6.7 g 2  . budesonide-formoterol (SYMBICORT) 160-4.5 MCG/ACT inhaler Inhale 2 puffs into the lungs 2 (two) times daily. 1 Inhaler 0  . busPIRone (BUSPAR) 5 MG tablet TAKE ONE TABLET BY MOUTH TWICE A DAY AS NEEDED FOR ANXIETY 30 tablet 3  . ELIQUIS 5 MG TABS tablet TAKE ONE TABLET BY MOUTH TWICE A DAY 60 tablet 10  . escitalopram (LEXAPRO) 20 MG tablet Take 1 tablet (20 mg total) by mouth daily. 90 tablet 1  . furosemide (LASIX) 40 MG tablet Take 1-2 tablets (40-80 mg total) by mouth 2 (two) times daily. 120 tablet 5  . gabapentin (NEURONTIN) 100 MG capsule Take 3 capsules (300 mg total) by mouth at bedtime. 90 capsule 5  . KLOR-CON M20 20 MEQ tablet TAKE ONE TABLET BY MOUTH TWICE A DAY 120 tablet 0  . levothyroxine (SYNTHROID) 150 MCG tablet TAKE ONE TABLET BY MOUTH DAILY 90 tablet 0  . montelukast (SINGULAIR) 10 MG tablet Take 1 tablet (10 mg total) by mouth daily. 30 tablet 11  . Multiple Vitamins-Minerals (MULTIVITAMINS THER. W/MINERALS) TABS Take 1 tablet by mouth daily.    . nortriptyline (PAMELOR) 50 MG capsule Take 1 capsule (50 mg total) by mouth at bedtime. 90 capsule 1  . pregabalin (LYRICA) 75 MG capsule Take 1 capsule (75 mg total) by mouth 2 (two) times daily. 60 capsule 2  . propranolol (INDERAL) 10 MG tablet TAKE ONE TABLET BY MOUTH TWICE A DAY 120 tablet 0  . Spacer/Aero-Holding Chambers (AEROCHAMBER MV) inhaler Use as instructed 1 each 0  . traMADol (ULTRAM) 50 MG tablet Take 1 tablet (50 mg total) by mouth every 12 (twelve) hours as needed for moderate pain. 30 tablet 1  . traZODone (DESYREL) 50 MG tablet TAKE ONE-HALF TO 1 TABLET BY MOUTH AT BEDTIME AS NEEDED FOR SLEEP 90 tablet 3   No current facility-administered medications for this visit.    Allergies:   Ambien [zolpidem]    Social History:  The patient  reports that he has quit smoking. His smoking use included cigars. He quit after 4.00 years of use. He has never used smokeless tobacco. He  reports current alcohol use of about 2.0 standard drinks of alcohol per week. He reports that he does not use drugs.   Family History:  The patient's family history includes Cancer in his father and mother; Colon cancer (age of onset: 24) in his brother; Kidney cancer in his sister.    ROS:  Please see the history of present illness.   Otherwise, review of systems are positive for DOE, fatigue.   All other systems are reviewed and negative.    PHYSICAL EXAM: VS:  BP 132/72   Pulse 91  Ht 5\' 11"  (1.803 m)   Wt 298 lb 6.4 oz (135.4 kg)   SpO2 97%   BMI 41.62 kg/m  , BMI Body mass index is 41.62 kg/m. GEN: Well nourished, well developed, in no acute distress  HEENT: normal  Neck: no JVD, carotid bruits, or masses Cardiac: RRR; no murmurs, rubs, or gallops,no edema  Respiratory:  clear to auscultation bilaterally, normal work of breathing GI: soft, nontender, nondistended, + BS MS: no deformity or atrophy  Skin: warm and dry, no rash Neuro:  Strength and sensation are intact Psych: euthymic mood, full affect   EKG:   The ekg ordered today demonstrates NSR, poor R wave progression, prolonged PR interval   Recent Labs: 03/15/2019: Pro B Natriuretic peptide (BNP) 31.0 04/20/2019: Magnesium 2.1 04/27/2019: ALT 62 05/29/2019: B Natriuretic Peptide 55.3; BUN 19; Creatinine, Ser 1.16; Hemoglobin 15.8; Platelets 334; Potassium 3.9; Sodium 137   Lipid Panel    Component Value Date/Time   CHOL 141 04/12/2017 0833   TRIG 100 02/18/2018 1021   HDL 38.20 (L) 04/12/2017 0833   CHOLHDL 4 04/12/2017 0833   VLDL 33.6 04/12/2017 0833   LDLCALC 69 04/12/2017 0833   LDLDIRECT 62.0 09/07/2006 0920     Other studies Reviewed: Additional studies/ records that were reviewed today with results demonstrating: prior abnormal stress test.   ASSESSMENT AND PLAN:  1. AFib: Maintaining NSR. Eliquis for stroke prevention.  2. DOE: He has oxygen but does not use it.    He has pulmonary f/u.   3. Chest pain:  It is concerning for angina.  Plan for cath as it is now occurring with minimal exertion in the house.  4. Obesity: Will benefit from weight loss long term. Cardiac catheterization was discussed with the patient fully. The patient understands that risks include but are not limited to stroke (1 in 1000), death (1 in 89), kidney failure [usually temporary] (1 in 500), bleeding (1 in 200), allergic reaction [possibly serious] (1 in 200).  The patient understands and is willing to proceed.      Current medicines are reviewed at length with the patient today.  The patient concerns regarding his medicines were addressed.  The following changes have been made:  No change  Labs/ tests ordered today include:  No orders of the defined types were placed in this encounter.   Recommend 150 minutes/week of aerobic exercise Low fat, low carb, high fiber diet recommended  Disposition:   FU in post cath   Signed, Larae Grooms, MD  07/12/2019 2:19 PM    [facility] [address], [place], [place]  [address] Phone: [phone]; Fax: [phone]

## 2019-07-13 ENCOUNTER — Inpatient Hospital Stay (HOSPITAL_COMMUNITY)
Admission: RE | Admit: 2019-07-13 | Discharge: 2019-07-13 | Disposition: A | Discharge status: Home / Self Care | Payer: Medicare Other | Source: Ambulatory Visit

## 2019-07-13 ENCOUNTER — Telehealth: Payer: Self-pay | Admitting: *Deleted

## 2019-07-13 LAB — CBC
Hematocrit: 49.6 % (ref 37.5–51.0)
Hemoglobin: 16.6 g/dL (ref 13.0–17.7)
MCH: 30.4 pg (ref 26.6–33.0)
MCHC: 33.5 g/dL (ref 31.5–35.7)
MCV: 91 fL (ref 79–97)
Platelets: 383 10*3/uL (ref 150–450)
RBC: 5.46 x10E6/uL (ref 4.14–5.80)
RDW: 13.2 % (ref 11.6–15.4)
WBC: 10.1 10*3/uL (ref 3.4–10.8)

## 2019-07-13 LAB — BASIC METABOLIC PANEL
BUN/Creatinine Ratio: 13 (ref 10–24)
BUN: 15 mg/dL (ref 8–27)
CO2: 28 mmol/L (ref 20–29)
Calcium: 9.5 mg/dL (ref 8.6–10.2)
Chloride: 99 mmol/L (ref 96–106)
Creatinine, Ser: 1.13 mg/dL (ref 0.76–1.27)
GFR calc Af Amer: 72 mL/min/{1.73_m2} (ref >59)
GFR calc non Af Amer: 62 mL/min/{1.73_m2} (ref >59)
Glucose: 97 mg/dL (ref 65–99)
Potassium: 4.9 mmol/L (ref 3.5–5.2)
Sodium: 138 mmol/L (ref 134–144)

## 2019-07-13 NOTE — Progress Notes (Signed)
Pt does not to be retested, as he is in the 90 day positive window 04/20/2019.

## 2019-07-13 NOTE — Telephone Encounter (Signed)
Pt contacted pre-catheterization scheduled at [facility] for: Friday July 14, 2019 10:30 AM Verified arrival time and place: [facility]) at: 8:30 AM   No solid food after midnight prior to cath, clear liquids until 5 AM day of procedure. Contrast allergy: no  Hold: Eliquis-last dose AM 07/12/19 until post procedure. Lasix/KCl-AM of procedure.  Except hold medications AM meds can be  taken pre-cath with sip of water including: ASA 81 mg   Confirmed patient has responsible adult to drive home post procedure and observe 24 hours after arriving home:   Currently, due to Covid-19 pandemic, only one support person will be allowed with patient. Must be the same support person for that patient's entire stay, will be screened and required to wear a mask. They will be asked to wait in the waiting room for the duration of the patient's stay.  Patients are required to wear a mask when they enter the hospital.      COVID-19 Pre-Screening Questions:  . In the past 7 to 10 days have you had a cough,  shortness of breath, headache, congestion, fever (100 or greater) body aches, chills, sore throat, or sudden loss of taste or sense of smell? no . Have you been around anyone with known Covid 19? no . Have you been around anyone who is awaiting Covid 19 test results in the past 7 to 10 days? no . Have you been around anyone who has been exposed to Covid 19, or has mentioned symptoms of Covid 19 within the past 7 to 10 days? no  I reviewed procedure/mask/visitor instructions, Covid-19 screening questions with patient's wife (with patient's verbal permission), she verbalized understanding, thanked me for call.  Pt had positive Coronavirus 04/20/19 at [facility].

## 2019-07-14 ENCOUNTER — Ambulatory Visit (HOSPITAL_COMMUNITY)
Admission: RE | Admit: 2019-07-14 | Discharge: 2019-07-14 | Disposition: A | Discharge status: Home / Self Care | Payer: Medicare Other | Attending: Interventional Cardiology | Admitting: Interventional Cardiology

## 2019-07-14 ENCOUNTER — Other Ambulatory Visit: Payer: Self-pay

## 2019-07-14 ENCOUNTER — Encounter (HOSPITAL_COMMUNITY)
Admission: RE | Disposition: A | Discharge status: Home / Self Care | Payer: Self-pay | Source: Home / Self Care | Attending: Interventional Cardiology

## 2019-07-14 DIAGNOSIS — Z8546 Personal history of malignant neoplasm of prostate: Secondary | ICD-10-CM | POA: Insufficient documentation

## 2019-07-14 DIAGNOSIS — I10 Essential (primary) hypertension: Secondary | ICD-10-CM | POA: Diagnosis not present

## 2019-07-14 DIAGNOSIS — Z7951 Long term (current) use of inhaled steroids: Secondary | ICD-10-CM | POA: Diagnosis not present

## 2019-07-14 DIAGNOSIS — Z86711 Personal history of pulmonary embolism: Secondary | ICD-10-CM | POA: Diagnosis not present

## 2019-07-14 DIAGNOSIS — Z79890 Hormone replacement therapy (postmenopausal): Secondary | ICD-10-CM | POA: Insufficient documentation

## 2019-07-14 DIAGNOSIS — E039 Hypothyroidism, unspecified: Secondary | ICD-10-CM | POA: Diagnosis not present

## 2019-07-14 DIAGNOSIS — R079 Chest pain, unspecified: Secondary | ICD-10-CM | POA: Diagnosis not present

## 2019-07-14 DIAGNOSIS — R0609 Other forms of dyspnea: Secondary | ICD-10-CM | POA: Diagnosis not present

## 2019-07-14 DIAGNOSIS — Z7901 Long term (current) use of anticoagulants: Secondary | ICD-10-CM | POA: Diagnosis not present

## 2019-07-14 DIAGNOSIS — R0600 Dyspnea, unspecified: Secondary | ICD-10-CM | POA: Diagnosis not present

## 2019-07-14 DIAGNOSIS — F419 Anxiety disorder, unspecified: Secondary | ICD-10-CM | POA: Insufficient documentation

## 2019-07-14 DIAGNOSIS — Z888 Allergy status to other drugs, medicaments and biological substances status: Secondary | ICD-10-CM | POA: Insufficient documentation

## 2019-07-14 DIAGNOSIS — E669 Obesity, unspecified: Secondary | ICD-10-CM | POA: Diagnosis not present

## 2019-07-14 DIAGNOSIS — I272 Pulmonary hypertension, unspecified: Secondary | ICD-10-CM | POA: Diagnosis not present

## 2019-07-14 DIAGNOSIS — Z6841 Body Mass Index (BMI) 40.0 and over, adult: Secondary | ICD-10-CM | POA: Diagnosis not present

## 2019-07-14 DIAGNOSIS — Z87891 Personal history of nicotine dependence: Secondary | ICD-10-CM | POA: Insufficient documentation

## 2019-07-14 DIAGNOSIS — Z79899 Other long term (current) drug therapy: Secondary | ICD-10-CM | POA: Insufficient documentation

## 2019-07-14 DIAGNOSIS — I4891 Unspecified atrial fibrillation: Secondary | ICD-10-CM | POA: Insufficient documentation

## 2019-07-14 DIAGNOSIS — M1990 Unspecified osteoarthritis, unspecified site: Secondary | ICD-10-CM | POA: Diagnosis not present

## 2019-07-14 DIAGNOSIS — G4733 Obstructive sleep apnea (adult) (pediatric): Secondary | ICD-10-CM | POA: Diagnosis not present

## 2019-07-14 DIAGNOSIS — I2510 Atherosclerotic heart disease of native coronary artery without angina pectoris: Secondary | ICD-10-CM | POA: Insufficient documentation

## 2019-07-14 HISTORY — PX: RIGHT/LEFT HEART CATH AND CORONARY ANGIOGRAPHY: CATH118266

## 2019-07-14 LAB — POCT I-STAT EG7
Acid-Base Excess: 4 mmol/L — ABNORMAL HIGH (ref 0.0–2.0)
Bicarbonate: 32 mmol/L — ABNORMAL HIGH (ref 20.0–28.0)
Calcium, Ion: 1.16 mmol/L (ref 1.15–1.40)
HCT: 45 % (ref 39.0–52.0)
Hemoglobin: 15.3 g/dL (ref 13.0–17.0)
O2 Saturation: 72 %
Potassium: 3.8 mmol/L (ref 3.5–5.1)
Sodium: 141 mmol/L (ref 135–145)
TCO2: 34 mmol/L — ABNORMAL HIGH (ref 22–32)
pCO2, Ven: 58.5 mmHg (ref 44.0–60.0)
pH, Ven: 7.346 (ref 7.250–7.430)
pO2, Ven: 41 mmHg (ref 32.0–45.0)

## 2019-07-14 LAB — POCT I-STAT 7, (LYTES, BLD GAS, ICA,H+H)
Acid-Base Excess: 4 mmol/L — ABNORMAL HIGH (ref 0.0–2.0)
Bicarbonate: 31.2 mmol/L — ABNORMAL HIGH (ref 20.0–28.0)
Calcium, Ion: 1.22 mmol/L (ref 1.15–1.40)
HCT: 46 % (ref 39.0–52.0)
Hemoglobin: 15.6 g/dL (ref 13.0–17.0)
O2 Saturation: 96 %
Potassium: 3.9 mmol/L (ref 3.5–5.1)
Sodium: 140 mmol/L (ref 135–145)
TCO2: 33 mmol/L — ABNORMAL HIGH (ref 22–32)
pCO2 arterial: 56.1 mmHg — ABNORMAL HIGH (ref 32.0–48.0)
pH, Arterial: 7.353 (ref 7.350–7.450)
pO2, Arterial: 92 mmHg (ref 83.0–108.0)

## 2019-07-14 SURGERY — RIGHT/LEFT HEART CATH AND CORONARY ANGIOGRAPHY
Anesthesia: LOCAL

## 2019-07-14 MED ORDER — ACETAMINOPHEN 325 MG PO TABS: 650.0000 mg | ORAL_TABLET | ORAL | Status: DC | PRN

## 2019-07-14 MED ORDER — SODIUM CHLORIDE 0.9% FLUSH: 3.0000 mL | Freq: Two times a day (BID) | INTRAVENOUS | Status: DC

## 2019-07-14 MED ORDER — HYDRALAZINE HCL 20 MG/ML IJ SOLN: 10.0000 mg | INTRAMUSCULAR | Status: DC | PRN

## 2019-07-14 MED ORDER — ONDANSETRON HCL 4 MG/2ML IJ SOLN: 4.0000 mg | Freq: Four times a day (QID) | INTRAMUSCULAR | Status: DC | PRN

## 2019-07-14 MED ORDER — HEPARIN (PORCINE) IN NACL 1000-0.9 UT/500ML-% IV SOLN
INTRAVENOUS | Status: DC | PRN
  Administered 2019-07-14 (×2): 500 mL

## 2019-07-14 MED ORDER — HEPARIN SODIUM (PORCINE) 1000 UNIT/ML IJ SOLN
INTRAMUSCULAR | Status: AC
  Filled 2019-07-14: qty 1

## 2019-07-14 MED ORDER — VERAPAMIL HCL 2.5 MG/ML IV SOLN
INTRAVENOUS | Status: DC | PRN
  Administered 2019-07-14: 10:00:00 10 mL via INTRA_ARTERIAL

## 2019-07-14 MED ORDER — MIDAZOLAM HCL 2 MG/2ML IJ SOLN
INTRAMUSCULAR | Status: DC | PRN
  Administered 2019-07-14 (×2): 1 mg via INTRAVENOUS

## 2019-07-14 MED ORDER — HEPARIN SODIUM (PORCINE) 1000 UNIT/ML IJ SOLN
INTRAMUSCULAR | Status: DC | PRN
  Administered 2019-07-14: 6000 [IU] via INTRAVENOUS

## 2019-07-14 MED ORDER — SODIUM CHLORIDE 0.9 % WEIGHT BASED INFUSION: 1.0000 mL/kg/h | INTRAVENOUS | Status: DC

## 2019-07-14 MED ORDER — FENTANYL CITRATE (PF) 100 MCG/2ML IJ SOLN
INTRAMUSCULAR | Status: AC
  Filled 2019-07-14: qty 2

## 2019-07-14 MED ORDER — SODIUM CHLORIDE 0.9 % IV SOLN: 250.0000 mL | INTRAVENOUS | Status: DC | PRN

## 2019-07-14 MED ORDER — SODIUM CHLORIDE 0.9 % WEIGHT BASED INFUSION: 3.0000 mL/kg/h | INTRAVENOUS | Status: AC

## 2019-07-14 MED ORDER — IOHEXOL 350 MG/ML SOLN
INTRAVENOUS | Status: DC | PRN
  Administered 2019-07-14: 11:00:00 55 mL

## 2019-07-14 MED ORDER — LIDOCAINE HCL (PF) 1 % IJ SOLN
INTRAMUSCULAR | Status: DC | PRN
  Administered 2019-07-14 (×2): 2 mL

## 2019-07-14 MED ORDER — FENTANYL CITRATE (PF) 100 MCG/2ML IJ SOLN
INTRAMUSCULAR | Status: DC | PRN
  Administered 2019-07-14 (×2): 25 ug via INTRAVENOUS

## 2019-07-14 MED ORDER — MIDAZOLAM HCL 2 MG/2ML IJ SOLN
INTRAMUSCULAR | Status: AC
  Filled 2019-07-14: qty 2

## 2019-07-14 MED ORDER — SODIUM CHLORIDE 0.9% FLUSH: 3.0000 mL | INTRAVENOUS | Status: DC | PRN

## 2019-07-14 MED ORDER — LABETALOL HCL 5 MG/ML IV SOLN: 10.0000 mg | INTRAVENOUS | Status: DC | PRN

## 2019-07-14 MED ORDER — LIDOCAINE HCL (PF) 1 % IJ SOLN
INTRAMUSCULAR | Status: AC
  Filled 2019-07-14: qty 30

## 2019-07-14 MED ORDER — SODIUM CHLORIDE 0.9 % IV SOLN
INTRAVENOUS | Status: AC | PRN
  Administered 2019-07-14: 10 mL/h via INTRAVENOUS

## 2019-07-14 MED ORDER — ASPIRIN 81 MG PO CHEW
81.0000 mg | CHEWABLE_TABLET | ORAL | Status: AC
  Administered 2019-07-14: 09:00:00 81 mg via ORAL

## 2019-07-14 MED ORDER — VERAPAMIL HCL 2.5 MG/ML IV SOLN
INTRAVENOUS | Status: AC
  Filled 2019-07-14: qty 2

## 2019-07-14 MED ORDER — HEPARIN (PORCINE) IN NACL 1000-0.9 UT/500ML-% IV SOLN
INTRAVENOUS | Status: AC
  Filled 2019-07-14: qty 1000

## 2019-07-14 MED ORDER — ASPIRIN 81 MG PO CHEW
CHEWABLE_TABLET | ORAL | Status: AC
  Filled 2019-07-14: qty 1

## 2019-07-14 SURGICAL SUPPLY — 13 items
CATH 5FR JL3.5 JR4 ANG PIG MP (CATHETERS) ×1 IMPLANT
CATH BALLN WEDGE 5F 110CM (CATHETERS) ×1 IMPLANT
DEVICE RAD COMP TR BAND LRG (VASCULAR PRODUCTS) ×1 IMPLANT
GLIDESHEATH SLEND SS 6F .021 (SHEATH) ×1 IMPLANT
GUIDEWIRE .025 260CM (WIRE) ×1 IMPLANT
GUIDEWIRE INQWIRE 1.5J.035X260 (WIRE) IMPLANT
INQWIRE 1.5J .035X260CM (WIRE) ×2
KIT HEART LEFT (KITS) ×2 IMPLANT
PACK CARDIAC CATHETERIZATION (CUSTOM PROCEDURE TRAY) ×2 IMPLANT
SHEATH GLIDE SLENDER 4/5FR (SHEATH) ×1 IMPLANT
TRANSDUCER W/STOPCOCK (MISCELLANEOUS) ×2 IMPLANT
TUBING CIL FLEX 10 FLL-RA (TUBING) ×2 IMPLANT
WIRE EMERALD 3MM-J .025X260CM (WIRE) ×1 IMPLANT

## 2019-07-14 NOTE — Discharge Instructions (Signed)
DRINK PLENTY OF FLUIDS FOR THE NEXT 2-3 DAYS.  KEEP ARM ELEVATED THE REMAINDER OF THE DAY.  Radial Site Care  This sheet gives you information about how to care for yourself after your procedure. Your health care provider may also give you more specific instructions. If you have problems or questions, contact your health care provider. What can I expect after the procedure? After the procedure, it is common to have:  Bruising and tenderness at the catheter insertion area. Follow these instructions at home: Medicines  Take over-the-counter and prescription medicines only as told by your health care provider. Insertion site care  Follow instructions from your health care provider about how to take care of your insertion site. Make sure you: ? Wash your hands with soap and water before you change your bandage (dressing). If soap and water are not available, use hand sanitizer. ? Change your dressing as told by your health care provider.  Check your insertion site every day for signs of infection. Check for: ? Redness, swelling, or pain. ? Fluid or blood. ? Pus or a bad smell. ? Warmth.  Do not take baths, swim, or use a hot tub until your health care provider approves.  You may shower 24-48 hours after the procedure. ? Remove the dressing and gently wash the site with plain soap and water. ? Pat the area dry with a clean towel. ? Do not rub the site. That could cause bleeding.  Do not apply powder or lotion to the site. Activity   For 24 hours after the procedure, or as directed by your health care provider: ? Do not flex or bend the affected arm. ? Do not push or pull heavy objects with the affected arm. ? Do not drive yourself home from the hospital or clinic. You may drive 24 hours after the procedure unless your health care provider tells you not to. ? Do not operate machinery or power tools.  Do not lift anything that is heavier than 10 lb for 5 days.  Ask your health  care provider when it is okay to: ? Return to work or school. ? Resume usual physical activities or sports. ? Resume sexual activity. General instructions  If the catheter site starts to bleed, raise your arm and put firm pressure on the site. If the bleeding does not stop, get help right away. This is a medical emergency.  If you went home on the same day as your procedure, a responsible adult should be with you for the first 24 hours after you arrive home.  Keep all follow-up visits as told by your health care provider. This is important. Contact a health care provider if:  You have a fever.  You have redness, swelling, or yellow drainage around your insertion site. Get help right away if:  You have unusual pain at the radial site.  The catheter insertion area swells very fast.  The insertion area is bleeding, and the bleeding does not stop when you hold steady pressure on the area.  Your arm or hand becomes pale, cool, tingly, or numb. These symptoms may represent a serious problem that is an emergency. Do not wait to see if the symptoms will go away. Get medical help right away. Call your local emergency services (911 in the [place] Do not drive yourself to the hospital. Summary  After the procedure, it is common to have bruising and tenderness at the site.  Follow instructions from your health care provider about how  to take care of your radial site wound. Check the wound every day for signs of infection.  Do not lift anything that is heavier than 10 lb for 5 days.  This information is not intended to replace advice given to you by your health care provider. Make sure you discuss any questions you have with your health care provider. Document Revised: 07/21/2017 Document Reviewed: 07/21/2017 Elsevier Patient Education  2020 [employer].

## 2019-07-14 NOTE — Interval H&P Note (Signed)
Cath Lab Visit (complete for each Cath Lab visit)  Clinical Evaluation Leading to the Procedure:   ACS: No.  Non-ACS:    Anginal Classification: CCS III  Anti-ischemic medical therapy: Minimal Therapy (1 class of medications)  Non-Invasive Test Results: No non-invasive testing performed  Prior CABG: No previous CABG   OSA, obesity.  High risk for pulm HTN.   History and Physical Interval Note:  07/14/2019 9:33 AM  Wayne Booth  has presented today for surgery, with the diagnosis of angina.  The various methods of treatment have been discussed with the patient and family. After consideration of risks, benefits and other options for treatment, the patient has consented to  Procedure(s): RIGHT/LEFT HEART CATH AND CORONARY ANGIOGRAPHY (N/A) as a surgical intervention.  The patient's history has been reviewed, patient examined, no change in status, stable for surgery.  I have reviewed the patient's chart and labs.  Questions were answered to the patient's satisfaction.     Larae Grooms

## 2019-07-19 ENCOUNTER — Encounter: Payer: Self-pay | Admitting: *Deleted

## 2019-07-19 ENCOUNTER — Ambulatory Visit (INDEPENDENT_AMBULATORY_CARE_PROVIDER_SITE_OTHER): Payer: Medicare Other | Admitting: *Deleted

## 2019-07-19 ENCOUNTER — Other Ambulatory Visit: Payer: Self-pay

## 2019-07-19 DIAGNOSIS — Z23 Encounter for immunization: Secondary | ICD-10-CM | POA: Diagnosis not present

## 2019-07-19 NOTE — Progress Notes (Signed)
Per orders of Dr. Yong Channel, injection of Shingrix 0.5 ml given by Anselmo Pickler in right deltoid. Patient tolerated injection well and has completed the series.

## 2019-07-19 NOTE — Addendum Note (Signed)
Addended by: Marian Sorrow on: 07/19/2019 11:07 AM   Modules accepted: Level of Service

## 2019-07-20 ENCOUNTER — Ambulatory Visit: Payer: Medicare Other | Admitting: Family Medicine

## 2019-07-30 NOTE — Progress Notes (Deleted)
Cardiology Office Note   Date:  07/30/2019   ID:  [name], Wayne Booth 10-Feb-1941, MRN [id number]  PCP:  Marin Olp, MD    No chief complaint on file.  Dyspnea on exertion  Wt Readings from Last 3 Encounters:  07/14/19 290 lb (131.5 kg)  07/12/19 298 lb 6.4 oz (135.4 kg)  06/21/19 290 lb (131.5 kg)       History of Present Illness: Wayne Booth is a 79 y.o. male  With obesity and OSA. He has had HTN as well. He has had dizziness intermittently for years, but it got worse in early Dec 2017. His BP meds were reduced and he has had no further episodes. Dizziness would occur would when going from sitting to standing. No syncope.   He had a cath in 4/08 that was normal.   He had hand surgeryin the pastand then a few weeks later, was diagnosed with PE. He was started on Xarelto.  Diagnosed with atrial flutter in 5/19. Hospitalized and converted to NSR. He was discharged on Eliquis.  Admitted in June with AFib with RVR.His watch was alerting him of a fast heart rate.  Stress test in 6/19 showed:Nuclear medicine stress test with a remote infarct along the proximal septum, small area of reversible ischemia at theapex, mild hypokinesia along the septum, intermediate risk. -Had intermittent episode of atrial flutter on 6/10, metoprolol was added to the regimen, remains in normal sinus rhythm -Cardiology recommended continue diltiazem increased to240mg  XLdaily, metoprolol 12.5 mg twice a day, Eliquis 5 mg twice a day  Chest pain was thought to be due to AFib.  He had COVID in 03/2019 and was hospitalized for 8 days.  Troponins were negative in 04/2019.   He also had pneumonia.   He is using oxygen intermittently.    He has had chest pain when his oxygen sats are at 80%.    Due to chest pain concerning for angina, cardiac cath was done In January 2021:  "Prox LAD to Mid LAD lesion is 25% stenosed.  The left ventricular systolic function is  normal.  LV end diastolic pressure is mildly elevated.  The left ventricular ejection fraction is 55-65% by visual estimate.  There is no aortic valve stenosis.  Hemodynamic findings consistent with mild pulmonary hypertension.  Ao sat 96%, PA sat 72%, mean PA pressure 31 mm Hg, mean PCWP 17 mm Hg; CO 6.0 L/min; CI 2.4  Noncardiac chest pain.   No significant CAD.  Mild pulmonary HTN.  Continue preventive therapy including treatment for sleep apnea and weight loss. "    Past Medical History:  Diagnosis Date  . Allergy    States his nose runs when he is around grease.   Marland Kitchen Anxiety   . Arthritis   . Atrial fibrillation ([place])   . Cancer ([place])   . Coronary artery disease   . History of total knee replacement   . Hypertension   . Hypothyroidism   . Kidney cancer, primary, with metastasis from kidney to other site [facility])   . Kidney stone   . OSA (obstructive sleep apnea)    pt does not wear cpap at night  . Pituitary cyst ([place])   . Pneumonia    hx of  . Prostate cancer [facility])     Past Surgical History:  Procedure Laterality Date  . ABLATION OF DYSRHYTHMIC FOCUS  01/28/2018  . ANTERIOR CERVICAL DECOMP/DISCECTOMY FUSION N/A 08/29/2012   Procedure: ANTERIOR CERVICAL DECOMPRESSION/DISCECTOMY FUSION  1 LEVEL;  Surgeon: Eustace Moore, MD;  Location: [place] NEURO ORS;  Service: Neurosurgery;  Laterality: N/A;  . APPENDECTOMY    . ATRIAL FIBRILLATION ABLATION N/A 01/28/2018   Procedure: ATRIAL FIBRILLATION ABLATION;  Surgeon: Constance Haw, MD;  Location: [place] CV LAB;  Service: Cardiovascular;  Laterality: N/A;  . CARDIAC CATHETERIZATION  2008   clean  . CHOLECYSTECTOMY    . COLONOSCOPY W/ POLYPECTOMY    . CRANIOTOMY N/A 11/08/2012   Procedure: CRANIOTOMY HYPOPHYSECTOMY TRANSNASAL APPROACH;  Surgeon: Faythe Ghee, MD;  Location: [place] NEURO ORS;  Service: Neurosurgery;  Laterality: N/A;  Transphenoidal Resection of Pituitary Tumor  . FINGER SURGERY Left 2017   Dr Amedeo Plenty  .  INSERTION PROSTATE RADIATION SEED    . IR THORACENTESIS ASP PLEURAL SPACE W/IMG GUIDE  04/15/2018  . KIDNEY STONE SURGERY    . KNEE ARTHROSCOPY  2007   left  . NEPHRECTOMY Left   . POSTERIOR CERVICAL LAMINECTOMY Left 04/24/2015   Procedure: Foraminotomy cervical five - cervical six cervical six - cervical seven left;  Surgeon: Eustace Moore, MD;  Location: [facility] NEURO ORS;  Service: Neurosurgery;  Laterality: Left;  . REPLACEMENT TOTAL KNEE BILATERAL    . RIGHT/LEFT HEART CATH AND CORONARY ANGIOGRAPHY N/A 07/14/2019   Procedure: RIGHT/LEFT HEART CATH AND CORONARY ANGIOGRAPHY;  Surgeon: Jettie Booze, MD;  Location: [place] CV LAB;  Service: Cardiovascular;  Laterality: N/A;     Current Outpatient Medications  Medication Sig Dispense Refill  . albuterol (VENTOLIN HFA) 108 (90 Base) MCG/ACT inhaler Inhale 2 puffs into the lungs every 6 (six) hours as needed for wheezing or shortness of breath. 6.7 g 2  . budesonide-formoterol (SYMBICORT) 160-4.5 MCG/ACT inhaler Inhale 2 puffs into the lungs 2 (two) times daily. 1 Inhaler 0  . busPIRone (BUSPAR) 5 MG tablet TAKE ONE TABLET BY MOUTH TWICE A DAY AS NEEDED FOR ANXIETY 30 tablet 3  . ELIQUIS 5 MG TABS tablet TAKE ONE TABLET BY MOUTH TWICE A DAY 60 tablet 10  . escitalopram (LEXAPRO) 20 MG tablet Take 1 tablet (20 mg total) by mouth daily. 90 tablet 1  . furosemide (LASIX) 40 MG tablet Take 1-2 tablets (40-80 mg total) by mouth 2 (two) times daily. 120 tablet 5  . gabapentin (NEURONTIN) 100 MG capsule Take 3 capsules (300 mg total) by mouth at bedtime. 90 capsule 5  . KLOR-CON M20 20 MEQ tablet TAKE ONE TABLET BY MOUTH TWICE A DAY 120 tablet 0  . levothyroxine (SYNTHROID) 150 MCG tablet TAKE ONE TABLET BY MOUTH DAILY 90 tablet 0  . montelukast (SINGULAIR) 10 MG tablet Take 1 tablet (10 mg total) by mouth daily. 30 tablet 11  . Multiple Vitamins-Minerals (MULTIVITAMINS THER. W/MINERALS) TABS Take 1 tablet by mouth daily.    . nortriptyline  (PAMELOR) 50 MG capsule Take 1 capsule (50 mg total) by mouth at bedtime. 90 capsule 1  . pregabalin (LYRICA) 75 MG capsule Take 1 capsule (75 mg total) by mouth 2 (two) times daily. 60 capsule 2  . propranolol (INDERAL) 10 MG tablet TAKE ONE TABLET BY MOUTH TWICE A DAY 120 tablet 0  . Spacer/Aero-Holding Chambers (AEROCHAMBER MV) inhaler Use as instructed 1 each 0  . traMADol (ULTRAM) 50 MG tablet Take 1 tablet (50 mg total) by mouth every 12 (twelve) hours as needed for moderate pain. 30 tablet 1  . traZODone (DESYREL) 50 MG tablet TAKE ONE-HALF TO 1 TABLET BY MOUTH AT BEDTIME AS NEEDED FOR SLEEP  90 tablet 3   No current facility-administered medications for this visit.    Allergies:   Ambien [zolpidem]    Social History:  The patient  reports that he has quit smoking. His smoking use included cigars. He quit after 4.00 years of use. He has never used smokeless tobacco. He reports current alcohol use of about 2.0 standard drinks of alcohol per week. He reports that he does not use drugs.   Family History:  The patient's ***family history includes Cancer in his father and mother; Colon cancer (age of onset: 49) in his brother; Kidney cancer in his sister.    ROS:  Please see the history of present illness.   Otherwise, review of systems are positive for ***.   All other systems are reviewed and negative.    PHYSICAL EXAM: VS:  There were no vitals taken for this visit. , BMI There is no height or weight on file to calculate BMI. GEN: Well nourished, well developed, in no acute distress  HEENT: normal  Neck: no JVD, carotid bruits, or masses Cardiac: ***RRR; no murmurs, rubs, or gallops,no edema  Respiratory:  clear to auscultation bilaterally, normal work of breathing GI: soft, nontender, nondistended, + BS MS: no deformity or atrophy  Skin: warm and dry, no rash Neuro:  Strength and sensation are intact Psych: euthymic mood, full affect   EKG:   The ekg ordered today  demonstrates ***   Recent Labs: 03/15/2019: Pro B Natriuretic peptide (BNP) 31.0 04/20/2019: Magnesium 2.1 04/27/2019: ALT 62 05/29/2019: B Natriuretic Peptide 55.3 07/12/2019: BUN 15; Creatinine, Ser 1.13; Platelets 383 07/14/2019: Hemoglobin 15.6; Potassium 3.9; Sodium 140   Lipid Panel    Component Value Date/Time   CHOL 141 04/12/2017 0833   TRIG 100 02/18/2018 1021   HDL 38.20 (L) 04/12/2017 0833   CHOLHDL 4 04/12/2017 0833   VLDL 33.6 04/12/2017 0833   LDLCALC 69 04/12/2017 0833   LDLDIRECT 62.0 09/07/2006 0920     Other studies Reviewed: Additional studies/ records that were reviewed today with results demonstrating: ***.   ASSESSMENT AND PLAN:  1.   Atrial fibrillation: Eliquis for stroke prevention.  This was restarted the day after his heart catheterization.  Maintaining normal sinus rhythm. 2.   Dyspnea on exertion: This is not coming from coronary artery disease.  He does have a requirement for supplemental oxygen.  He has pulmonary follow-up.  Hypoxemia could be because of his anginal symptoms, but there is no need for revascularization. 3.   Mild pulmonary hypertension, likely related to obesity and lung disease.   Current medicines are reviewed at length with the patient today.  The patient concerns regarding his medicines were addressed.  The following changes have been made:  No change***  Labs/ tests ordered today include: *** No orders of the defined types were placed in this encounter.   Recommend 150 minutes/week of aerobic exercise Low fat, low carb, high fiber diet recommended  Disposition:   FU in ***   Signed, Larae Grooms, MD  07/30/2019 2:50 PM    [facility] [address], [place], [place]  [address] Phone: [phone]; Fax: [phone]

## 2019-07-31 ENCOUNTER — Ambulatory Visit (INDEPENDENT_AMBULATORY_CARE_PROVIDER_SITE_OTHER): Payer: Medicare Other | Admitting: Family Medicine

## 2019-07-31 ENCOUNTER — Inpatient Hospital Stay (HOSPITAL_COMMUNITY): Admission: RE | Admit: 2019-07-31 | Payer: Medicare Other | Source: Ambulatory Visit

## 2019-07-31 ENCOUNTER — Other Ambulatory Visit: Payer: Self-pay

## 2019-07-31 ENCOUNTER — Ambulatory Visit: Payer: Medicare Other | Admitting: Interventional Cardiology

## 2019-07-31 ENCOUNTER — Encounter: Payer: Self-pay | Admitting: Family Medicine

## 2019-07-31 VITALS — BP 138/70 | HR 96 | Temp 98.0°F | Ht 71.0 in | Wt 292.6 lb

## 2019-07-31 DIAGNOSIS — I4892 Unspecified atrial flutter: Secondary | ICD-10-CM

## 2019-07-31 DIAGNOSIS — R739 Hyperglycemia, unspecified: Secondary | ICD-10-CM

## 2019-07-31 DIAGNOSIS — R202 Paresthesia of skin: Secondary | ICD-10-CM | POA: Diagnosis not present

## 2019-07-31 DIAGNOSIS — E890 Postprocedural hypothyroidism: Secondary | ICD-10-CM | POA: Diagnosis not present

## 2019-07-31 DIAGNOSIS — I2782 Chronic pulmonary embolism: Secondary | ICD-10-CM | POA: Diagnosis not present

## 2019-07-31 DIAGNOSIS — I10 Essential (primary) hypertension: Secondary | ICD-10-CM | POA: Diagnosis not present

## 2019-07-31 DIAGNOSIS — J9611 Chronic respiratory failure with hypoxia: Secondary | ICD-10-CM | POA: Diagnosis not present

## 2019-07-31 DIAGNOSIS — F411 Generalized anxiety disorder: Secondary | ICD-10-CM | POA: Diagnosis not present

## 2019-07-31 LAB — CBC WITH DIFFERENTIAL/PLATELET
Basophils Absolute: 0.1 10*3/uL (ref 0.0–0.1)
Basophils Relative: 1.2 % (ref 0.0–3.0)
Eosinophils Absolute: 0.8 10*3/uL — ABNORMAL HIGH (ref 0.0–0.7)
Eosinophils Relative: 8.5 % — ABNORMAL HIGH (ref 0.0–5.0)
HCT: 48.3 % (ref 39.0–52.0)
Hemoglobin: 15.9 g/dL (ref 13.0–17.0)
Lymphocytes Relative: 16.4 % (ref 12.0–46.0)
Lymphs Abs: 1.6 10*3/uL (ref 0.7–4.0)
MCHC: 33 g/dL (ref 30.0–36.0)
MCV: 92.7 fl (ref 78.0–100.0)
Monocytes Absolute: 1.1 10*3/uL — ABNORMAL HIGH (ref 0.1–1.0)
Monocytes Relative: 11.1 % (ref 3.0–12.0)
Neutro Abs: 6 10*3/uL (ref 1.4–7.7)
Neutrophils Relative %: 62.8 % (ref 43.0–77.0)
Platelets: 368 10*3/uL (ref 150.0–400.0)
RBC: 5.21 Mil/uL (ref 4.22–5.81)
RDW: 14.4 % (ref 11.5–15.5)
WBC: 9.5 10*3/uL (ref 4.0–10.5)

## 2019-07-31 LAB — COMPREHENSIVE METABOLIC PANEL
ALT: 33 U/L (ref 0–53)
AST: 31 U/L (ref 0–37)
Albumin: 3.5 g/dL (ref 3.5–5.2)
Alkaline Phosphatase: 89 U/L (ref 39–117)
BUN: 15 mg/dL (ref 6–23)
CO2: 29 mEq/L (ref 19–32)
Calcium: 9.2 mg/dL (ref 8.4–10.5)
Chloride: 99 mEq/L (ref 96–112)
Creatinine, Ser: 1.07 mg/dL (ref 0.40–1.50)
GFR: 67 mL/min (ref >60.00)
Glucose, Bld: 133 mg/dL — ABNORMAL HIGH (ref 70–99)
Potassium: 4.4 mEq/L (ref 3.5–5.1)
Sodium: 138 mEq/L (ref 135–145)
Total Bilirubin: 0.5 mg/dL (ref 0.2–1.2)
Total Protein: 7.2 g/dL (ref 6.0–8.3)

## 2019-07-31 LAB — VITAMIN B12: Vitamin B-12: 372 pg/mL (ref 211–911)

## 2019-07-31 LAB — TSH: TSH: 0.97 u[IU]/mL (ref 0.35–4.50)

## 2019-07-31 LAB — T4, FREE: Free T4: 0.75 ng/dL (ref 0.60–1.60)

## 2019-07-31 LAB — HEMOGLOBIN A1C: Hgb A1c MFr Bld: 6.3 % (ref 4.6–6.5)

## 2019-07-31 LAB — T3, FREE: T3, Free: 3.2 pg/mL (ref 2.3–4.2)

## 2019-07-31 NOTE — Progress Notes (Signed)
Phone [phone] In person visit   Subjective:   Wayne Booth is a 79 y.o. year old very pleasant male patient who presents for/with See problem oriented charting Chief Complaint  Patient presents with  . Follow-up  . burning on feet    goes up to shins and the right one is worse x2 weeks   This visit occurred during the SARS-CoV-2 public health emergency.  Safety protocols were in place, including screening questions prior to the visit, additional usage of staff PPE, and extensive cleaning of exam room while observing appropriate contact time as indicated for disinfecting solutions.   Past Medical History-  Patient Active Problem List   Diagnosis Date Noted  . Pneumonia due to COVID-19 virus 04/20/2019    Priority: High  . Memory loss 06/10/2018    Priority: High  . Chronic respiratory failure with hypoxia (HCC) 04/26/2018    Priority: High  . CAD (coronary artery disease) 04/09/2018    Priority: High  . CKD (chronic kidney disease) stage 3, GFR 30-59 ml/min     Priority: High  . Chest pain     Priority: High  . Atrial flutter ([place]) 11/01/2017    Priority: High  . Chronic pulmonary embolism ([place]) 10/23/2016    Priority: High  . Asthmatic bronchitis 06/09/2011    Priority: High  . History of prostate cancer-seed implantation 2010.  Dr. Alinda Money 10/02/2008    Priority: High  . History of renal cell carcinoma 12/26/2007    Priority: High  . PTSD (post-traumatic stress disorder) 07/27/2018    Priority: Medium  . HCAP (healthcare-associated pneumonia) 02/03/2018    Priority: Medium  . History of adenomatous polyp of colon 04/12/2017    Priority: Medium  . Cervical disc disease 11/13/2015    Priority: Medium  . Essential tremor 08/14/2015    Priority: Medium  . Gout 08/14/2015    Priority: Medium  . Hypothyroidism 07/12/2014    Priority: Medium  . Hyperglycemia 07/12/2014    Priority: Medium  . GAD (generalized anxiety disorder) 11/07/2013    Priority: Medium   . OSA (obstructive sleep apnea) 07/07/2011    Priority: Medium  . Morbid obesity ([place]) 10/02/2008    Priority: Medium  . Insomnia 12/26/2007    Priority: Medium  . Essential hypertension 12/28/2006    Priority: Medium  . Aortic atherosclerosis ([place]) 05/10/2018    Priority: Low  . First degree AV block 04/15/2018    Priority: Low  . Constipation 01/21/2018    Priority: Low  . Anticoagulated     Priority: Low  . Pain in left knee 07/15/2017    Priority: Low  . Bilateral foot pain 07/13/2017    Priority: Low  . Community acquired pneumonia 06/18/2017    Priority: Low  . Status post nephrectomy 06/09/2017    Priority: Low  . VTE (venous thromboembolism) 06/09/2017    Priority: Low  . Actinic keratosis 11/27/2009    Priority: Low  . Osteoarthritis 11/13/2008    Priority: Low  . TESTOSTERONE DEFICIENCY 12/28/2006    Priority: Low  . Hyponatremia 04/15/2018  . Pleural effusion 04/14/2018  . Chronic pain 04/09/2018  . Dyspnea     Medications- reviewed and updated Current Outpatient Medications  Medication Sig Dispense Refill  . albuterol (VENTOLIN HFA) 108 (90 Base) MCG/ACT inhaler Inhale 2 puffs into the lungs every 6 (six) hours as needed for wheezing or shortness of breath. 6.7 g 2  . budesonide-formoterol (SYMBICORT) 160-4.5 MCG/ACT inhaler Inhale 2 puffs into the  lungs 2 (two) times daily. 1 Inhaler 0  . busPIRone (BUSPAR) 5 MG tablet TAKE ONE TABLET BY MOUTH TWICE A DAY AS NEEDED FOR ANXIETY 30 tablet 3  . ELIQUIS 5 MG TABS tablet TAKE ONE TABLET BY MOUTH TWICE A DAY 60 tablet 10  . escitalopram (LEXAPRO) 20 MG tablet Take 1 tablet (20 mg total) by mouth daily. 90 tablet 1  . furosemide (LASIX) 40 MG tablet Take 1-2 tablets (40-80 mg total) by mouth 2 (two) times daily. 120 tablet 5  . gabapentin (NEURONTIN) 100 MG capsule Take 3 capsules (300 mg total) by mouth at bedtime. 90 capsule 5  . KLOR-CON M20 20 MEQ tablet TAKE ONE TABLET BY MOUTH TWICE A DAY 120 tablet 0  .  levothyroxine (SYNTHROID) 150 MCG tablet TAKE ONE TABLET BY MOUTH DAILY 90 tablet 0  . montelukast (SINGULAIR) 10 MG tablet Take 1 tablet (10 mg total) by mouth daily. 30 tablet 11  . Multiple Vitamins-Minerals (MULTIVITAMINS THER. W/MINERALS) TABS Take 1 tablet by mouth daily.    . nortriptyline (PAMELOR) 50 MG capsule Take 1 capsule (50 mg total) by mouth at bedtime. 90 capsule 1  . pregabalin (LYRICA) 75 MG capsule Take 1 capsule (75 mg total) by mouth 2 (two) times daily. 60 capsule 2  . propranolol (INDERAL) 10 MG tablet TAKE ONE TABLET BY MOUTH TWICE A DAY 120 tablet 0  . Spacer/Aero-Holding Chambers (AEROCHAMBER MV) inhaler Use as instructed 1 each 0  . traMADol (ULTRAM) 50 MG tablet Take 1 tablet (50 mg total) by mouth every 12 (twelve) hours as needed for moderate pain. 30 tablet 1  . traZODone (DESYREL) 50 MG tablet TAKE ONE-HALF TO 1 TABLET BY MOUTH AT BEDTIME AS NEEDED FOR SLEEP 90 tablet 3   No current facility-administered medications for this visit.     Objective:  BP 138/70   Pulse 96   Temp 98 F (36.7 C)   Ht 5\' 11"  (1.803 m)   Wt 292 lb 9.6 oz (132.7 kg)   SpO2 94%   BMI 40.81 kg/m  Gen: NAD, resting comfortably CV: RRR no murmurs rubs or gallops Lungs: CTAB no crackles, wheeze, rhonchi Ext: trace edema, feels monofilament on all areas of feet but reduced on soles of feet Skin: warm, dry    Assessment and Plan   #Burning in the feet S: Patient has complained of burning in the feet particularly when they swell but also at times when they are not swelling.  He has had this for at least a year he thinks but worse over last 2 weeks. Of note we did reduce nortriptyline last visit to 50 mg. Reports pain 5-6/10 in the day and up to 8/10 at night. On lyrica through podiatry. On gabapentin through [place] (also for neck issues)  Currently for pain control he is using tramadol-I have asked him to use this sparingly due to serotonin syndrome risk given his other medications.     In regards to potential neuropathy work-up.  B12 was normal December 2019.  Hemoglobin A1c was in prediabetes range November 2019 at 5.9.  TSH was in normal range December 2019. A/P: Suspect neuropathy as the cause of patient's discomfort. - appears podiatry already started lyrica. I do think reduction of nortriptyline contirbuting -Update T3, t4, B12, A1c as has been over a year - could consider neurology consult if labs unrevealing- he wants to just monitor for now after discussion today.   Wt Readings from Last 3 Encounters:  07/31/19  292 lb 9.6 oz (132.7 kg)  07/14/19 290 lb (131.5 kg)  07/12/19 298 lb 6.4 oz (135.4 kg)    #Heart failure with preserved ejection fraction S: At last visit we increased furosemide to 80 mg in the morning and 40 mg in the evening for 1 week.  After that he was to return to 40 mg twice a day.  1 patient is fluid overloaded and he has edema, lower legs tend to get red and patient gets concerned about cellulitis-fortunately last visit we did not require antibiotics. Weight largely stable around 290. He reports continued breathing issues since covid- has breathing test on monday A/P: reasonable control- continue lasix 40mg  twice a day    #Generalized anxiety disorder #Cervical disc disease S: Patient compliant with Lexapro 20 mg and nortriptyline 50 mg (reduced from 75 mg last visit).  Patient previously was on higher dose for cervical disc disease-he feels like he is doing okay with lower dose-with that being said lower dose may be worsening neuropathy -Not using cyclobenzaprine -Using buspirone- once or twice a week  -Sparing tramadol- he reports once or twice a week  A/P: Anxiety is reasonably controlled- continue current rx   # chronic respiratory failure- continued 02 requirement at night.  Off oxygen in daytime unless really pushes himself  #Chronic PE/atrial flutter-  Remains on eliquis with history of PE- we will continue- also anticoagulates for  his a flutter. He is on propranolol for rate control. HR high normal- will monitor  #hyprtension -controlled but high normal. Continue propranolol and lasix.   Recommended follow up: 3-4 months Future Appointments  Date Time Provider [place]  07/31/2019 10:00 AM [facility] None  07/31/2019 11:40 AM Jettie Booze, MD [facility]  08/04/2019  2:00 PM [facility] None  08/04/2019  3:00 PM Parrett, Tammy S, NP [facility] None    Lab/Order associations:   ICD-10-CM   1. Hyperglycemia  R73.9 Hemoglobin A1c  2. Paresthesias  R20.2 TSH    Vitamin B12  3. Other chronic pulmonary embolism without acute cor pulmonale (HCC)  I27.82   4. Atrial flutter, unspecified type ([place])  I48.92   5. Chronic respiratory failure with hypoxia (HCC)  J96.11   6. GAD (generalized anxiety disorder)  F41.1   7. 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 Essential hypertension  I10 CBC with Differential/Platelet    Comprehensive metabolic panel   Return precautions advised.  [name], MD

## 2019-07-31 NOTE — Addendum Note (Signed)
Addended by: Francis Dowse T on: 07/31/2019 02:11 PM   Modules accepted: Orders

## 2019-07-31 NOTE — Patient Instructions (Addendum)
Please stop by lab before you go If you do not have mychart- we will call you about results within 5 business days of [place] receiving them.  If you have mychart- we will send your results within 3 business days of [place] receiving them.  If abnormal or we want to clarify a result, we will call or mychart you to make sure you receive the message.  If you have questions or concerns or don't hear within 5-7 days, please send [place] a message or call us.    Continue to try to avoid tramadol as much as possible- you are on several medicines that could cause serotonin syndrome which we want to avoid  You are on multiple meds for neuropathy (pain in the feet) already- we could refer to neurology if you would like but im not sure there is much more they can do for the pain- you can talk it over with your wife and let us know- tell her we said hello!   No other changes today- continue to use buspirone and tramadol sparingly to help avoid serotonin syndrome as below    Serotonin Syndrome Serotonin is a chemical in your body (neurotransmitter) that helps to control several functions, such as:  Brain and nerve cell function.  Mood and emotions.  Memory.  Eating.  Sleeping.  Sexual activity.  Stress response. Having too much serotonin in your body can cause serotonin syndrome. This condition can be harmful to your brain and nerve cells. This can be a life-threatening condition. What are the causes? This condition may be caused by taking medicines or drugs that increase the level of serotonin in your body, such as:  Antidepressant medicines.  Migraine medicines.  Certain pain medicines.  Certain drugs, including ecstasy, LSD, cocaine, and amphetamines.  Over-the-counter cough or cold medicines that contain dextromethorphan.  Certain herbal supplements, including St. John's wort, ginseng, and nutmeg. This condition usually occurs when you take these medicines or drugs in combination, but it can  also happen with a high dose of a single medicine or drug. What increases the risk? You are more likely to develop this condition if:  You just started taking a medicine or drug that increases the level of serotonin in the body.  You recently increased the dose of a medicine or drug that increases the level of serotonin in the body.  You take more than one medicine or drug that increases the level of serotonin in the body. What are the signs or symptoms? Symptoms of this condition usually start within several hours of taking a medicine or drug. Symptoms may be mild or severe. Mild symptoms include:  Sweating.  Restlessness or agitation.  Muscle twitching or stiffness.  Rapid heart rate.  Nausea and vomiting.  Diarrhea.  Headache.  Shivering or goose bumps.  Confusion. Severe symptoms include:  Irregular heartbeat.  Seizures.  Loss of consciousness.  High fever. How is this diagnosed? This condition may be diagnosed based on:  Your medical history.  A physical exam.  Your prior use of drugs and medicines.  Blood or urine tests. These may be used to rule out other causes of your symptoms. How is this treated? The treatment for this condition depends on the severity of your symptoms.  For mild cases, stopping the medicine or drug that caused your condition is usually all that is needed.  For moderate to severe cases, treatment in a hospital may be needed to prevent or manage life-threatening symptoms. This may include medicines  to control your symptoms, IV fluids, interventions to support your breathing, and treatments to control your body temperature. Follow these instructions at home: Medicines   Take over-the-counter and prescription medicines only as told by your health care provider. This is important.  Check with your health care provider before you start taking any new prescriptions, over-the-counter medicines, herbs, or supplements.  Avoid combining  any medicines that can cause this condition to occur. Lifestyle   Maintain a healthy lifestyle. ? Eat a healthy diet that includes plenty of vegetables, fruits, whole grains, low-fat dairy products, and lean protein. Do not eat a lot of foods that are high in fat, added sugars, or salt. ? Get the right amount and quality of sleep. Most adults need 7-9 hours of sleep each night. ? Make time to exercise, even if it is only for short periods of time. Most adults should exercise for at least 150 minutes each week. ? Do not drink alcohol. ? Do not use illegal drugs, and do not take medicines for reasons other than they are prescribed. General instructions  Do not use any products that contain nicotine or tobacco, such as cigarettes and e-cigarettes. If you need help quitting, ask your health care provider.  Keep all follow-up visits as told by your health care provider. This is important. Contact a health care provider if:  Your symptoms do not improve or they get worse. Get help right away if you:  Have worsening confusion, severe headache, chest pain, high fever, seizures, or loss of consciousness.  Experience serious side effects of medicine, such as swelling of your face, lips, tongue, or throat.  Have serious thoughts about hurting yourself or others. These symptoms may represent a serious problem that is an emergency. Do not wait to see if the symptoms will go away. Get medical help right away. Call your local emergency services (911 in the [place] Do not drive yourself to the hospital. If you ever feel like you may hurt yourself or others, or have thoughts about taking your own life, get help right away. You can go to your nearest emergency department or call:  Your local emergency services (911 in the [place]  A suicide crisis helpline, such as the [place] at 670-112-4857. This is open 24 hours a day. Summary  Serotonin is a brain chemical that helps  to regulate the nervous system. High levels of serotonin in the body can cause serotonin syndrome, which is a very dangerous condition.  This condition may be caused by taking medicines or drugs that increase the level of serotonin in your body.  Treatment depends on the severity of your symptoms. For mild cases, stopping the medicine or drug that caused your condition is usually all that is needed.  Check with your health care provider before you start taking any new prescriptions, over-the-counter medicines, herbs, or supplements. This information is not intended to replace advice given to you by your health care provider. Make sure you discuss any questions you have with your health care provider. Document Revised: 07/23/2017 Document Reviewed: 07/23/2017 Elsevier Patient Education  2020 [employer].

## 2019-08-01 ENCOUNTER — Telehealth: Payer: Self-pay

## 2019-08-01 NOTE — Telephone Encounter (Signed)
Patient wife is returning call regarding lab results

## 2019-08-02 NOTE — Telephone Encounter (Signed)
Returned call and lm tcb. 

## 2019-08-03 ENCOUNTER — Telehealth: Payer: Self-pay | Admitting: *Deleted

## 2019-08-03 ENCOUNTER — Other Ambulatory Visit: Payer: Self-pay

## 2019-08-03 MED ORDER — METFORMIN HCL 500 MG PO TABS: 500.0000 mg | ORAL_TABLET | Freq: Every day | ORAL | 3 refills | Status: DC

## 2019-08-03 NOTE — Telephone Encounter (Signed)
[name] was unaware that he had an appt on 2/1 for covid testing. They would like to be rescheduled to have covid testing and PFT. She says patient needs to keep his appt with T. Parrett that scheduled for tomorrow. Please reschedule.

## 2019-08-04 ENCOUNTER — Other Ambulatory Visit: Payer: Self-pay

## 2019-08-04 ENCOUNTER — Ambulatory Visit (INDEPENDENT_AMBULATORY_CARE_PROVIDER_SITE_OTHER): Payer: Medicare Other

## 2019-08-04 ENCOUNTER — Encounter: Payer: Self-pay | Admitting: Adult Health

## 2019-08-04 ENCOUNTER — Ambulatory Visit: Payer: Medicare Other | Admitting: Adult Health

## 2019-08-04 VITALS — BP 126/64 | HR 99 | Temp 97.9°F | Ht 71.0 in | Wt 297.2 lb

## 2019-08-04 DIAGNOSIS — R0602 Shortness of breath: Secondary | ICD-10-CM | POA: Diagnosis not present

## 2019-08-04 DIAGNOSIS — J4540 Moderate persistent asthma, uncomplicated: Secondary | ICD-10-CM | POA: Diagnosis not present

## 2019-08-04 DIAGNOSIS — U071 COVID-19: Secondary | ICD-10-CM | POA: Diagnosis not present

## 2019-08-04 DIAGNOSIS — G4733 Obstructive sleep apnea (adult) (pediatric): Secondary | ICD-10-CM | POA: Diagnosis not present

## 2019-08-04 DIAGNOSIS — J181 Lobar pneumonia, unspecified organism: Secondary | ICD-10-CM

## 2019-08-04 DIAGNOSIS — J1282 Pneumonia due to coronavirus disease 2019: Secondary | ICD-10-CM

## 2019-08-04 MED ORDER — BUDESONIDE-FORMOTEROL FUMARATE 160-4.5 MCG/ACT IN AERO: 2.0000 | INHALATION_SPRAY | Freq: Two times a day (BID) | RESPIRATORY_TRACT | 5 refills | Status: DC

## 2019-08-04 MED ORDER — ALBUTEROL SULFATE HFA 108 (90 BASE) MCG/ACT IN AERS: 2.0000 | INHALATION_SPRAY | Freq: Four times a day (QID) | RESPIRATORY_TRACT | 2 refills | Status: DC | PRN

## 2019-08-04 NOTE — Progress Notes (Signed)
@Patient  ID: Wayne Booth, male    DOB: [birth year], 79 y.o.   MRN: [id number]  Chief Complaint  Patient presents with  . Follow-up    Asthma     Referring provider: Marin Olp, MD  HPI: 79 year old male former smoker (very minimum) followed for recurrent pneumonia and asthmatic bronchitis Patient has severe obstructive sleep apnea and has CPAP intolerance. Previous PE in 2018 on anticoagulation Medical history significant for previous pituitary tumor removal, atrial fibrillation, renal cell carcinoma status post nephrectomy and diastolic heart failure [id number] infection and Covid pneumonia -treated with remdesivir and Decadron October 2020  TEST/EVENTS :  CT chest June 08, 2017 showed a small left lower lobe area of consolidation.  PFT 07/2017 moderate restriction, DLCO 67%  CT chest October 2019 small to moderate bilateral pleural effusions consolidations within the left greater than right lower lobe and lingula. Hazy density in the left greater than right lower lobe.  2D echo February 04, 2018 showed EF 50 to 55%  12/2018 HRCT Chest -patchy parenchymal groundglass with interstitial coarsening bilaterally similar to 2019 from a multi lobar pneumonia felt to be a component of postinflammatory changes not consistent with UIP.   08/04/2019 Follow up : Asthmatic bronchitis, oxygen dependent respiratory failure, diastolic heart failure, [id number] (October 2020) Patient presents for a 76-month follow-up visit.  Patient has underlying asthmatic bronchitis.  Patient says overall breathing has not been doing as good.  Has daily cough that is minimally productive.  Gets winded with minimal activity.  Ankles do swell but have been at baseline recently.  He had COVID-19 in October 2020.  Says he gets short of breath with minimum activity.  He is supposed to be taking Symbicort twice daily but instructs me that he has not taken on a regular basis.  We discussed the importance of  medication compliance.  Chest x-ray October 2020 showed left basilar infiltrate and small effusion.  CT chest May 29, 2019 showed no PE.  Patchy groundglass opacity throughout both lungs concerning for atypical infection such as viral pneumonia. Previous high-resolution CT chest in July 2020 patient has a baseline groundglass with interstitial coarsening similar to 2019 felt to be a component of postinflammatory changes not consistent with UIP.Marland Kitchen Patient underwent a right and left heart cath July 14, 2019 that showed nonobstructive coronary disease EF of 55 to 65% mild pulmonary hypertension with mean pulmonary artery pressure at 31 mmHg.  Patient has known underlying obstructive sleep apnea.  Previously CPAP intolerant.  We discussed underlying sleep apnea and potential complications from untreated sleep apnea along .  Patient agrees to a repeat sleep study and possibly try on CPAP.      Allergies  Allergen Reactions  . Ambien [Zolpidem] Anxiety    Anxiety and agitation when tried as sleeper in hospital aug 2019    Immunization History  Administered Date(s) Administered  . Fluad Quad(high Dose 65+) 03/16/2019  . Influenza Split 04/01/2011, 03/29/2012  . Influenza Whole 04/12/2007, 03/18/2009, 03/04/2010  . Influenza, High Dose Seasonal PF 04/03/2014, 04/04/2015, 04/24/2016, 04/12/2017, 04/26/2018  . Influenza,inj,Quad PF,6+ Mos 03/22/2013  . Influenza-Unspecified 05/29/2017  . Pneumococcal Conjugate-13 11/12/2014  . Pneumococcal Polysaccharide-23 06/09/2011  . Pneumococcal-Unspecified 06/30/2015  . Td 06/29/1994  . Tdap 06/09/2011  . Zoster 09/22/2007  . Zoster Recombinat (Shingrix) 03/16/2019, 07/19/2019    Past Medical History:  Diagnosis Date  . Allergy    States his nose runs when he is around grease.   Marland Kitchen Anxiety   . Arthritis   .  Atrial fibrillation ([place])   . Cancer ([place])   . Coronary artery disease   . History of total knee replacement   . Hypertension   .  Hypothyroidism   . Kidney cancer, primary, with metastasis from kidney to other site [facility])   . Kidney stone   . OSA (obstructive sleep apnea)    pt does not wear cpap at night  . Pituitary cyst ([place])   . Pneumonia    hx of  . Prostate cancer ([place])     Tobacco History: Social History   Tobacco Use  Smoking Status Never Smoker  Smokeless Tobacco Never Used   Counseling given: Not Answered   Outpatient Medications Prior to Visit  Medication Sig Dispense Refill  . albuterol (VENTOLIN HFA) 108 (90 Base) MCG/ACT inhaler Inhale 2 puffs into the lungs every 6 (six) hours as needed for wheezing or shortness of breath. 6.7 g 2  . budesonide-formoterol (SYMBICORT) 160-4.5 MCG/ACT inhaler Inhale 2 puffs into the lungs 2 (two) times daily. 1 Inhaler 0  . busPIRone (BUSPAR) 5 MG tablet TAKE ONE TABLET BY MOUTH TWICE A DAY AS NEEDED FOR ANXIETY 30 tablet 3  . ELIQUIS 5 MG TABS tablet TAKE ONE TABLET BY MOUTH TWICE A DAY 60 tablet 10  . escitalopram (LEXAPRO) 20 MG tablet Take 1 tablet (20 mg total) by mouth daily. 90 tablet 1  . furosemide (LASIX) 40 MG tablet Take 1-2 tablets (40-80 mg total) by mouth 2 (two) times daily. 120 tablet 5  . gabapentin (NEURONTIN) 100 MG capsule Take 3 capsules (300 mg total) by mouth at bedtime. 90 capsule 5  . KLOR-CON M20 20 MEQ tablet TAKE ONE TABLET BY MOUTH TWICE A DAY 120 tablet 0  . levothyroxine (SYNTHROID) 150 MCG tablet TAKE ONE TABLET BY MOUTH DAILY 90 tablet 0  . metFORMIN (GLUCOPHAGE) 500 MG tablet Take 1 tablet (500 mg total) by mouth daily with breakfast. 90 tablet 3  . montelukast (SINGULAIR) 10 MG tablet Take 1 tablet (10 mg total) by mouth daily. 30 tablet 11  . Multiple Vitamins-Minerals (MULTIVITAMINS THER. W/MINERALS) TABS Take 1 tablet by mouth daily.    . nortriptyline (PAMELOR) 50 MG capsule Take 1 capsule (50 mg total) by mouth at bedtime. 90 capsule 1  . pregabalin (LYRICA) 75 MG capsule Take 1 capsule (75 mg total) by mouth 2 (two) times  daily. 60 capsule 2  . propranolol (INDERAL) 10 MG tablet TAKE ONE TABLET BY MOUTH TWICE A DAY 120 tablet 0  . Spacer/Aero-Holding Chambers (AEROCHAMBER MV) inhaler Use as instructed 1 each 0  . traMADol (ULTRAM) 50 MG tablet Take 1 tablet (50 mg total) by mouth every 12 (twelve) hours as needed for moderate pain. 30 tablet 1  . traZODone (DESYREL) 50 MG tablet TAKE ONE-HALF TO 1 TABLET BY MOUTH AT BEDTIME AS NEEDED FOR SLEEP 90 tablet 3   No facility-administered medications prior to visit.     Review of Systems:   Constitutional:   No  weight loss, night sweats,  Fevers, chills, positive fatigue, or  lassitude.  HEENT:   No headaches,  Difficulty swallowing,  Tooth/dental problems, or  Sore throat,                No sneezing, itching, ear ache, nasal congestion, post nasal drip,   CV:  No chest pain,  Orthopnea, PND, , anasarca, dizziness, palpitations, syncope.   GI  No heartburn, indigestion, abdominal pain, nausea, vomiting, diarrhea, change in bowel habits, loss of  appetite, bloody stools.   Resp:   No chest wall deformity  Skin: no rash or lesions.  GU: no dysuria, change in color of urine, no urgency or frequency.  No flank pain, no hematuria   MS:  No joint pain or swelling.  No decreased range of motion.  No back pain.    Physical Exam  BP 126/64 (BP Location: Left Arm, Cuff Size: Normal)   Pulse 99   Temp 97.9 F (36.6 C) (Temporal)   Ht 5\' 11"  (1.803 m)   Wt 297 lb 3.2 oz (134.8 kg)   SpO2 95% Comment: RA  BMI 41.45 kg/m   GEN: A/Ox3; pleasant , NAD, BMI 41   HEENT:  [place]/AT,  , NOSE-clear, THROAT-clear, no lesions, no postnasal drip or exudate noted.   NECK:  Supple w/ fair ROM; no JVD; normal carotid impulses w/o bruits; no thyromegaly or nodules palpated; no lymphadenopathy.    RESP  Clear  P & A; w/o, wheezes/ rales/ or rhonchi. no accessory muscle use, no dullness to percussion  CARD:  RRR, no m/r/g, tr peripheral edema, pulses intact, no cyanosis or  clubbing.  GI:   Soft & nt; nml bowel sounds; no organomegaly or masses detected.   Musco: Warm bil, no deformities or joint swelling noted.   Neuro: alert, no focal deficits noted.    Skin: Warm, no lesions or rashes    Lab Results:  CBC  BMET  Imaging: CARDIAC CATHETERIZATION  Result Date: 07/14/2019  Prox LAD to Mid LAD lesion is 25% stenosed.  The left ventricular systolic function is normal.  LV end diastolic pressure is mildly elevated.  The left ventricular ejection fraction is 55-65% by visual estimate.  There is no aortic valve stenosis.  Hemodynamic findings consistent with mild pulmonary hypertension.  Ao sat 96%, PA sat 72%, mean PA pressure 31 mm Hg, mean PCWP 17 mm Hg; CO 6.0 L/min; CI 2.4  Noncardiac chest pain.  No significant CAD.  Mild pulmonary HTN.  Continue preventive therapy including treatment for sleep apnea and weight loss.      PFT Results Latest Ref Rng & Units 08/11/2017  FVC-Pre L 2.20  FVC-Predicted Pre % 54  FVC-Post L 2.16  FVC-Predicted Post % 53  Pre FEV1/FVC % % 79  Post FEV1/FCV % % 84  FEV1-Pre L 1.74  FEV1-Predicted Pre % 59  FEV1-Post L 1.81  DLCO UNC% % 67  DLCO COR %Predicted % 132  TLC L 4.77  TLC % Predicted % 69  RV % Predicted % 96    No results found for: NITRICOXIDE      Assessment & Plan:   No problem-specific Assessment & Plan notes found for this encounter.     Rexene Edison, NP 08/04/2019

## 2019-08-04 NOTE — Patient Instructions (Addendum)
Arrange Split night study .  Chest xray today Mucinex DM Twice daily  As needed  Cough /congestion  USE Symbicort 160 2 puffs Twice daily  -please take as directed.  Albuterol Inhaler 2 puffs every 4hrs as needed for wheezing .  Please wear your  Oxygen 2l/m with activity and At bedtime  Low salt diet . Legs elevated .  Work on weight loss .  Weigh daily , keep log .  Advance activity as tolerated  Follow up with Dr. Elsworth Soho  Or Jazzmon Prindle NP in 6- 8 weeks with PFT .  Please contact office for sooner follow up if symptoms do not improve or worsen or seek emergency care

## 2019-08-05 DIAGNOSIS — G4733 Obstructive sleep apnea (adult) (pediatric): Secondary | ICD-10-CM | POA: Diagnosis not present

## 2019-08-07 NOTE — Assessment & Plan Note (Signed)
Continue on present regimen.  Patient is encouraged on medication compliance.  He is to continue on Symbicort twice daily.

## 2019-08-07 NOTE — Assessment & Plan Note (Signed)
Previous sleep study and 2013 showed very severe sleep apnea with an AHI at 114.  Patient has previously been intolerant to CPAP.  We discussed potential complications of untreated sleep apnea along with recent mild pulmonary hypertension noted on right heart cath.  Patient is in agreement to repeat his sleep study.  And reconsider beginning sleep device as indicated.  Plan  Patient Instructions  Arrange Split night study .  Chest xray today Mucinex DM Twice daily  As needed  Cough /congestion  USE Symbicort 160 2 puffs Twice daily  -please take as directed.  Albuterol Inhaler 2 puffs every 4hrs as needed for wheezing .  Please wear your  Oxygen 2l/m with activity and At bedtime  Low salt diet . Legs elevated .  Work on weight loss .  Weigh daily , keep log .  Advance activity as tolerated  Follow up with Dr. Elsworth Soho  Or Elif Yonts NP in 6- 8 weeks with PFT .  Please contact office for sooner follow up if symptoms do not improve or worsen or seek emergency care

## 2019-08-07 NOTE — Assessment & Plan Note (Signed)
Diagnosed with COVID-17 April 2019 with Covid pneumonia.  Patient is clinically recovering.   Previous CT chest November 2020 showed some groundglass opacities consistent with probable Covid pneumonia.  Previous high-resolution CT chest and July 2020 did show some interstitial coarsening felt to be some postinflammatory changes from previous pneumonia.  Not consistent with UIP. Patient seems to be slowly improving.  We will follow on chest x-ray.. Check PFTs on return

## 2019-08-08 ENCOUNTER — Telehealth: Payer: Self-pay | Admitting: Adult Health

## 2019-08-08 NOTE — Telephone Encounter (Signed)
LMOM TCB x1 for patient's spouse Jeral Fruit (dpr on file)    June Leap, [facility]  08/08/2019 2:04 PM EST    Left a voicemail for patient to call our office back for chest xray result's.   Parrett, Fonnie Mu, NP  08/07/2019 9:04 PM EST    Known baseline abnormal CT chest with GGO concerning or post inflammatory changes . CT chest 04/2019 post covid with GGO /COVID PNA .    CXR shows increased interstitial markings . Continue to follow. Clinically improving . Will recheck on return. Decide on next CT chest if indicated.

## 2019-08-09 NOTE — Telephone Encounter (Signed)
Pt's wife Bethena Roys returning call for results - CB# [phone]

## 2019-08-09 NOTE — Telephone Encounter (Signed)
Spoke with patient. He verbalized understanding about results. He however had an additional question. He wanted to know what could be causing the continued SOB with exertion and chest pressure. He stated he only gets the SOB and chest pressure during exertion.   TP, please advise. Thanks!

## 2019-08-09 NOTE — Telephone Encounter (Signed)
Pt calling back. Pt can be reached at [phone].

## 2019-08-09 NOTE — Progress Notes (Signed)
LMTCB

## 2019-08-09 NOTE — Telephone Encounter (Signed)
LVMTCB x 2 for patient.   DPR states to call [phone] to leave a message on, however it is a general voicemail greeting, does not give patient name.

## 2019-08-10 NOTE — Telephone Encounter (Signed)
Sorry to hear he is still having sob .  Recent R/L heart cath last month showed below:   If he is still have chest pain with exertion needs follow up with cardiology to discuss  I adjusted his pulmonary medication at office visit last week to see if this will help and ordred sleep study as he has pulmonary HTN , OSA , and AB , DCHF .  If he is not improving will need to be seen back in the office with Dr. Elsworth Soho  Or APP for further evaluation   Please contact office for sooner follow up if symptoms do not improve or worsen or seek emergency care   Please verify taking meds as was not taking correctly at office visit .   Recent CT neg for PE. On Eliquis , so doubt PE but as above if not improving will need to be seen .    Prox LAD to Mid LAD lesion is 25% stenosed.  The left ventricular systolic function is normal.  LV end diastolic pressure is mildly elevated.  The left ventricular ejection fraction is 55-65% by visual estimate.  There is no aortic valve stenosis.  Hemodynamic findings consistent with mild pulmonary hypertension.  Ao sat 96%, PA sat 72%, mean PA pressure 31 mm Hg, mean PCWP 17 mm Hg; CO 6.0 L/min; CI 2.4  Noncardiac chest pain.   No significant CAD.  Mild pulmonary HTN.  Continue preventive therapy including treatment for sleep apnea and weight loss.

## 2019-08-10 NOTE — Telephone Encounter (Signed)
ATC pt, no answer. Left message for pt to call back.  

## 2019-08-11 NOTE — Telephone Encounter (Signed)
LMTCB x2 for pt 

## 2019-08-12 ENCOUNTER — Other Ambulatory Visit: Payer: Self-pay | Admitting: Family Medicine

## 2019-08-13 ENCOUNTER — Other Ambulatory Visit: Payer: Self-pay | Admitting: Family Medicine

## 2019-08-14 NOTE — Telephone Encounter (Signed)
LMTCB x 3- will leave open to try again due to nature of call

## 2019-08-15 NOTE — Telephone Encounter (Signed)
We have attempted to contact pt several times with no success or call back from pt. Per triage protocol, message will be closed.  

## 2019-08-19 ENCOUNTER — Ambulatory Visit: Payer: Medicare Other | Attending: Internal Medicine

## 2019-08-19 DIAGNOSIS — Z23 Encounter for immunization: Secondary | ICD-10-CM | POA: Insufficient documentation

## 2019-08-19 NOTE — Progress Notes (Signed)
   Covid-19 Vaccination Clinic  Name:  Wayne Booth    MRN: [id number] DOB: 18-Jan-1941  08/19/2019  [name] was observed post Covid-19 immunization for 15 minutes without incidence. He was provided with Vaccine Information Sheet and instruction to access the V-Safe system.   [name] was instructed to call 911 with any severe reactions post vaccine: Marland Kitchen Difficulty breathing  . Swelling of your face and throat  . A fast heartbeat  . A bad rash all over your body  . Dizziness and weakness    Immunizations Administered    Name Date Dose VIS Date Route   Pfizer COVID-19 Vaccine 08/19/2019  8:57 AM 0.3 mL 06/09/2019 Intramuscular   Manufacturer: [place]   Lot: [id number]   [place]: [id number]

## 2019-08-29 ENCOUNTER — Other Ambulatory Visit: Payer: Self-pay | Admitting: Family Medicine

## 2019-08-30 ENCOUNTER — Ambulatory Visit (INDEPENDENT_AMBULATORY_CARE_PROVIDER_SITE_OTHER): Payer: Medicare Other | Admitting: Adult Health

## 2019-08-30 ENCOUNTER — Encounter: Payer: Medicare Other | Admitting: Adult Health

## 2019-08-30 ENCOUNTER — Other Ambulatory Visit: Payer: Self-pay

## 2019-08-30 ENCOUNTER — Encounter: Payer: Self-pay | Admitting: Adult Health

## 2019-08-30 DIAGNOSIS — Z8616 Personal history of COVID-19: Secondary | ICD-10-CM | POA: Diagnosis not present

## 2019-08-30 DIAGNOSIS — J849 Interstitial pulmonary disease, unspecified: Secondary | ICD-10-CM | POA: Diagnosis not present

## 2019-08-30 DIAGNOSIS — G4733 Obstructive sleep apnea (adult) (pediatric): Secondary | ICD-10-CM

## 2019-08-30 DIAGNOSIS — J4540 Moderate persistent asthma, uncomplicated: Secondary | ICD-10-CM | POA: Diagnosis not present

## 2019-08-30 MED ORDER — PREDNISONE 10 MG PO TABS: ORAL_TABLET | ORAL | 0 refills | Status: DC

## 2019-08-30 NOTE — Progress Notes (Signed)
Virtual Visit via Telephone Note  I connected with Wayne Booth on 08/30/19 at 11:00 AM EST by telephone and verified that I am speaking with the correct person using two identifiers.  Location: Patient: Home  Provider: Office    I discussed the limitations, risks, security and privacy concerns of performing an evaluation and management service by telephone and the availability of in person appointments. I also discussed with the patient that there may be a patient responsible charge related to this service. The patient expressed understanding and agreed to proceed.   History of Present Illness: 79 yo male former smoker (very minimum ) followed for recurrent PNA and AB .  Interstitial changes on CT chest felt to be a component of post inflammatory changes. Has severe OSA with CPAP intolerance.  Previous PE in 2018 on anticoagulation.  Medical history significant for previous pituitary tumor removal, A. fib, renal cell carcinoma status post nephrectomy.  Diastolic heart failure [id number] infection with Covid pneumonia treated with remdesivir and Decadron October 2020  Today's televisit is an acute office visit for ongoing cough shortness of breath and intermittent wheezing.  Cough is minimally productive mainly dry and aggravating.  Patient says he feels he is doing okay but he continues to have persistent cough and shortness of breath.  Symptoms have waxed and waned since he was diagnosed with COVID-19 in October 2020.  He does notice over the last 2 weeks he had increased dry cough some intermittent wheezing and shortness of breath.  He denies any hemoptysis, calf pain, fever, chills orthopnea.  Patient is on Eliquis and reports compliance.  Patient has known severe sleep apnea.  Has been set up for a sleep study that is still pending.  Patient has been CPAP intolerant in the past but agrees to try CPAP again. He has daytime sleepiness and restless sleep.  Positive  snoring.  Observations/Objective: CT chest June 08, 2017 showed a small left lower lobe area of consolidation.  PFT 07/2017 moderate restriction, DLCO 67%  CT chest October 2019 small to moderate bilateral pleural effusions consolidations within the left greater than right lower lobe and lingula. Hazy density in the left greater than right lower lobe.  2D echo February 04, 2018 showed EF 50 to 55%  12/2018 HRCT Chest -patchy parenchymal groundglass with interstitial coarsening bilaterally similar to 2019 from a multi lobar pneumonia felt to be a component of postinflammatory changes not consistent with UIP.   Assessment and Plan: Asthmatic bronchitis-current exacerbation.  Patient may be also having lingering post Covid cough.  Patient did have interstitial changes on high-resolution CT chest last year felt not to be consistent with UIP more often postinflammatory changes as he has had previous pneumonias in the past.  Patient has upcoming PFTs.  Will repeat CT as he did have Covid pneumonia in October.  Treat with empiric steroid challenge.  Patient education on steroid side effects  Previous sleep study 2013 showed very severe sleep apnea with AHI at 114.  He been intolerant to CPAP in the past.  We have ordered a split-night study to reexamine his underlying sleep apnea and he is open to possible CPAP treatment  Plan  Patient Instructions  Prednisone taper over next week  Mucinex DM Twice daily  As needed  Cough /congestion  USE Symbicort 160 2 puffs Twice daily  -please take as directed.  Albuterol Inhaler 2 puffs every 4hrs as needed for wheezing .  Please wear your  Oxygen 2l/m with activity and  At bedtime  Low salt diet . Legs elevated .  Work on weight loss .  Weigh daily , keep log .  Advance activity as tolerated  Set up HRCT chest .  Follow up with Dr. Elsworth Soho  Or Idelia Caudell NP in 4 weeks with PFT .  Please contact office for sooner follow up if symptoms do not improve  or worsen or seek emergency care            Follow Up Instructions: Follow up in 4 weeks and As needed   Please contact office for sooner follow up if symptoms do not improve or worsen or seek emergency care     I discussed the assessment and treatment plan with the patient. The patient was provided an opportunity to ask questions and all were answered. The patient agreed with the plan and demonstrated an understanding of the instructions.   The patient was advised to call back or seek an in-person evaluation if the symptoms worsen or if the condition fails to improve as anticipated.  I provided 22  minutes of non-face-to-face time during this encounter.   Rexene Edison, NP

## 2019-08-30 NOTE — Patient Instructions (Signed)
Prednisone taper over next week  Mucinex DM Twice daily  As needed  Cough /congestion  USE Symbicort 160 2 puffs Twice daily  -please take as directed.  Albuterol Inhaler 2 puffs every 4hrs as needed for wheezing .  Please wear your  Oxygen 2l/m with activity and At bedtime  Low salt diet . Legs elevated .  Work on weight loss .  Weigh daily , keep log .  Advance activity as tolerated  Set up HRCT chest .  Follow up with Dr. Elsworth Soho  Or Rozlynn Lippold NP in 4 weeks with PFT .  Please contact office for sooner follow up if symptoms do not improve or worsen or seek emergency care

## 2019-08-31 ENCOUNTER — Encounter: Payer: Self-pay | Admitting: Adult Health

## 2019-09-02 DIAGNOSIS — G4733 Obstructive sleep apnea (adult) (pediatric): Secondary | ICD-10-CM | POA: Diagnosis not present

## 2019-09-06 ENCOUNTER — Other Ambulatory Visit: Payer: Self-pay

## 2019-09-06 ENCOUNTER — Ambulatory Visit (INDEPENDENT_AMBULATORY_CARE_PROVIDER_SITE_OTHER)
Admission: RE | Admit: 2019-09-06 | Discharge: 2019-09-06 | Disposition: A | Discharge status: Home / Self Care | Payer: Medicare Other | Source: Ambulatory Visit | Attending: Adult Health | Admitting: Adult Health

## 2019-09-06 DIAGNOSIS — J849 Interstitial pulmonary disease, unspecified: Secondary | ICD-10-CM | POA: Diagnosis not present

## 2019-09-06 DIAGNOSIS — R911 Solitary pulmonary nodule: Secondary | ICD-10-CM | POA: Diagnosis not present

## 2019-09-08 ENCOUNTER — Telehealth: Payer: Self-pay | Admitting: Pulmonary Disease

## 2019-09-08 NOTE — Telephone Encounter (Signed)
Please see ct results note

## 2019-09-08 NOTE — Telephone Encounter (Signed)
Spoke with the pt's spouse, [name] (on [place])  She is requesting CT Chest results from 09/06/19  Will forward to TP since she is the ordering provider  Please advise, thanks!

## 2019-09-08 NOTE — Telephone Encounter (Signed)
ATC pt, no answer. Left message for pt to call back.    Parrett, Fonnie Mu, NP  09/08/2019 3:49 PM EST    CT chest showed areas of fibrosis similar to prior CT chest - slightly more organized , difficult to determine as resp motion.  - will need to look at PFT planned for next month to look how lung function is holding up.  Also small nodule noted in RUL will need f/up - would set up CT chest in 3-4 months .  Will discuss in detail at return ov .

## 2019-09-11 ENCOUNTER — Ambulatory Visit: Payer: Medicare Other | Attending: Internal Medicine

## 2019-09-11 DIAGNOSIS — Z23 Encounter for immunization: Secondary | ICD-10-CM

## 2019-09-11 NOTE — Telephone Encounter (Signed)
LMTCB x2 for pt 

## 2019-09-11 NOTE — Progress Notes (Signed)
   Covid-19 Vaccination Clinic  Name:  Wayne Booth    MRN: [id number] DOB: Nov 06, 1940  09/11/2019  [name] was observed post Covid-19 immunization for 15 minutes without incident. He was provided with Vaccine Information Sheet and instruction to access the V-Safe system.   [name] was instructed to call 911 with any severe reactions post vaccine: Marland Kitchen Difficulty breathing  . Swelling of face and throat  . A fast heartbeat  . A bad rash all over body  . Dizziness and weakness   Immunizations Administered    Name Date Dose VIS Date Route   Pfizer COVID-19 Vaccine 09/11/2019  2:07 PM 0.3 mL 06/09/2019 Intramuscular   Manufacturer: [employer]   Lot: [id number]   [place]: [id number]

## 2019-09-12 NOTE — Telephone Encounter (Signed)
Called and spoke with pt's wife Wayne Booth letting her know the results of pt's CT. Pt does have an upcoming appt with TP 4/9 after PFT but they are wanting to know if she can call them to further explain the results of the CT as they do not want to wait until the appt to have a further explanation of the results.  Tammy, please advise. Thanks!

## 2019-09-12 NOTE — Telephone Encounter (Signed)
[place] back

## 2019-09-12 NOTE — Telephone Encounter (Signed)
lmtcb for pt wife.

## 2019-09-12 NOTE — Telephone Encounter (Signed)
[name] wife is returning phone call.  [name] phone number is [phone].

## 2019-09-12 NOTE — Telephone Encounter (Signed)
Called to discuss CT finding, discussed results , follow up as planned for PFT next month .

## 2019-09-13 ENCOUNTER — Other Ambulatory Visit: Payer: Self-pay | Admitting: Adult Health

## 2019-09-13 DIAGNOSIS — R911 Solitary pulmonary nodule: Secondary | ICD-10-CM

## 2019-09-13 NOTE — Progress Notes (Signed)
Called spoke with patient, advised of CT results / recs as stated by [facility] NP.  Pt verbalized understanding and denied any questions.  Orders only encounter created for the repeat CT in 3-4 months.

## 2019-09-14 DIAGNOSIS — Z961 Presence of intraocular lens: Secondary | ICD-10-CM | POA: Diagnosis not present

## 2019-09-14 DIAGNOSIS — H524 Presbyopia: Secondary | ICD-10-CM | POA: Diagnosis not present

## 2019-09-14 DIAGNOSIS — H353132 Nonexudative age-related macular degeneration, bilateral, intermediate dry stage: Secondary | ICD-10-CM | POA: Diagnosis not present

## 2019-09-14 DIAGNOSIS — H26491 Other secondary cataract, right eye: Secondary | ICD-10-CM | POA: Diagnosis not present

## 2019-09-15 ENCOUNTER — Other Ambulatory Visit (HOSPITAL_COMMUNITY)
Admission: RE | Admit: 2019-09-15 | Discharge: 2019-09-15 | Disposition: A | Discharge status: Home / Self Care | Payer: Medicare Other | Source: Ambulatory Visit | Attending: Pulmonary Disease | Admitting: Pulmonary Disease

## 2019-09-15 DIAGNOSIS — Z01812 Encounter for preprocedural laboratory examination: Secondary | ICD-10-CM | POA: Insufficient documentation

## 2019-09-15 DIAGNOSIS — Z20822 Contact with and (suspected) exposure to covid-19: Secondary | ICD-10-CM | POA: Diagnosis not present

## 2019-09-15 LAB — SARS CORONAVIRUS 2 (TAT 6-24 HRS): SARS Coronavirus 2: NEGATIVE

## 2019-09-18 ENCOUNTER — Other Ambulatory Visit: Payer: Self-pay

## 2019-09-18 ENCOUNTER — Ambulatory Visit (HOSPITAL_BASED_OUTPATIENT_CLINIC_OR_DEPARTMENT_OTHER): Payer: Medicare Other | Attending: Adult Health | Admitting: Pulmonary Disease

## 2019-09-18 DIAGNOSIS — G4733 Obstructive sleep apnea (adult) (pediatric): Secondary | ICD-10-CM | POA: Insufficient documentation

## 2019-09-18 DIAGNOSIS — Z79899 Other long term (current) drug therapy: Secondary | ICD-10-CM | POA: Diagnosis not present

## 2019-09-19 DIAGNOSIS — G4733 Obstructive sleep apnea (adult) (pediatric): Secondary | ICD-10-CM

## 2019-09-19 NOTE — Procedures (Signed)
    Patient Name: Wayne Booth, Wayne Booth Date: 09/18/2019 Gender: Male D.O.B: 24-Feb-1941 Age (years): 77 Referring Provider: Tammy Parrett Height (inches): 71 Interpreting Physician: Chesley Mires MD, ABSM Weight (lbs): 300 RPSGT: Laren Everts BMI: 42 MRN: [id number] Neck Size: 23.00  CLINICAL INFORMATION Sleep Study Type: NPSG  Indication for sleep study: Hypertension, Obesity, OSA, Witnessed Apneas  Most recent polysomnogram dated 01/02/2018 revealed an AHI of 98.3/h and RDI of 106.2/h. Most recent titration study dated 01/02/2018 was optimal at 17cm H2O with an AHI of 11.9/h.  SLEEP STUDY TECHNIQUE As per the AASM Manual for the Scoring of Sleep and Associated Events v2.3 (April 2016) with a hypopnea requiring 4% desaturations.  The channels recorded and monitored were frontal, central and occipital EEG, electrooculogram (EOG), submentalis EMG (chin), nasal and oral airflow, thoracic and abdominal wall motion, anterior tibialis EMG, snore microphone, electrocardiogram, and pulse oximetry.  MEDICATIONS Medications self-administered by patient taken the night of the study : TRAZODONE, BUSPAR  SLEEP ARCHITECTURE The study was initiated at 11:35:53 PM and ended at 5:31:16 AM.  Sleep onset time was 38.8 minutes and the sleep efficiency was 59.5%%. The total sleep time was 211.5 minutes.  Stage REM latency was N/A minutes.  The patient spent 17.5%% of the night in stage N1 sleep, 82.5%% in stage N2 sleep, 0.0%% in stage N3 and 0% in REM.  Alpha intrusion was absent.  Supine sleep was 100.00%.  RESPIRATORY PARAMETERS The overall apnea/hypopnea index (AHI) was 26.1 per hour. There were 0 total apneas, including 0 obstructive, 0 central and 0 mixed apneas. There were 92 hypopneas and 21 RERAs.  The AHI during Stage REM sleep was N/A per hour.  AHI while supine was 26.1 per hour.  The mean oxygen saturation was 87.7%. The minimum SpO2 during sleep was 82.0%.  moderate  snoring was noted during this study.  CARDIAC DATA The 2 lead EKG demonstrated sinus rhythm. The mean heart rate was 88.6 beats per minute. Other EKG findings include: PVCs.  LEG MOVEMENT DATA The total PLMS were 0 with a resulting PLMS index of 0.0. Associated arousal with leg movement index was 0.0 .  IMPRESSIONS - Moderate obstructive sleep apnea with an AHI of 26.1 and SpO2 low of 82%.  DIAGNOSIS - Obstructive Sleep Apnea (327.23 [G47.33 ICD-10])  RECOMMENDATIONS - Additional therapies include weight loss, CPAP, oral appliance, or surgical assessment.  [Electronically signed] 09/19/2019 08:49 AM  Chesley Mires MD, ABSM Diplomate, American Board of Sleep Medicine   NPI: [id number]

## 2019-10-03 ENCOUNTER — Other Ambulatory Visit: Payer: Self-pay | Admitting: Family Medicine

## 2019-10-03 ENCOUNTER — Other Ambulatory Visit (HOSPITAL_COMMUNITY): Payer: Medicare Other

## 2019-10-03 DIAGNOSIS — G4733 Obstructive sleep apnea (adult) (pediatric): Secondary | ICD-10-CM | POA: Diagnosis not present

## 2019-10-06 ENCOUNTER — Ambulatory Visit: Payer: Medicare Other | Admitting: Adult Health

## 2019-10-07 ENCOUNTER — Other Ambulatory Visit (HOSPITAL_COMMUNITY)
Admission: RE | Admit: 2019-10-07 | Discharge: 2019-10-07 | Disposition: A | Discharge status: Home / Self Care | Payer: Medicare Other | Source: Ambulatory Visit | Attending: Adult Health | Admitting: Adult Health

## 2019-10-07 DIAGNOSIS — Z01812 Encounter for preprocedural laboratory examination: Secondary | ICD-10-CM | POA: Diagnosis not present

## 2019-10-07 DIAGNOSIS — Z20822 Contact with and (suspected) exposure to covid-19: Secondary | ICD-10-CM | POA: Diagnosis not present

## 2019-10-07 LAB — SARS CORONAVIRUS 2 (TAT 6-24 HRS): SARS Coronavirus 2: NEGATIVE

## 2019-10-10 ENCOUNTER — Other Ambulatory Visit: Payer: Self-pay

## 2019-10-10 ENCOUNTER — Ambulatory Visit (INDEPENDENT_AMBULATORY_CARE_PROVIDER_SITE_OTHER): Payer: Medicare Other | Admitting: Pulmonary Disease

## 2019-10-10 DIAGNOSIS — J4540 Moderate persistent asthma, uncomplicated: Secondary | ICD-10-CM | POA: Diagnosis not present

## 2019-10-10 LAB — PULMONARY FUNCTION TEST
DL/VA % pred: 129 %
DL/VA: 5.13 ml/min/mmHg/L
DLCO cor % pred: 61 %
DLCO cor: 14.73 ml/min/mmHg
DLCO unc % pred: 61 %
DLCO unc: 14.73 ml/min/mmHg
FEF 25-75 Post: 1.52 L/sec
FEF 25-75 Pre: 1.37 L/sec
FEF2575-%Change-Post: 10 %
FEF2575-%Pred-Post: 74 %
FEF2575-%Pred-Pre: 67 %
FEV1-%Change-Post: 0 %
FEV1-%Pred-Post: 41 %
FEV1-%Pred-Pre: 41 %
FEV1-Post: 1.2 L
FEV1-Pre: 1.2 L
FEV1FVC-%Change-Post: 4 %
FEV1FVC-%Pred-Pre: 115 %
FEV6-%Change-Post: -3 %
FEV6-%Pred-Post: 36 %
FEV6-%Pred-Pre: 37 %
FEV6-Post: 1.36 L
FEV6-Pre: 1.41 L
FEV6FVC-%Change-Post: 1 %
FEV6FVC-%Pred-Post: 106 %
FEV6FVC-%Pred-Pre: 105 %
FVC-%Change-Post: -4 %
FVC-%Pred-Post: 34 %
FVC-%Pred-Pre: 35 %
FVC-Post: 1.36 L
FVC-Pre: 1.43 L
Post FEV1/FVC ratio: 88 %
Post FEV6/FVC ratio: 100 %
Pre FEV1/FVC ratio: 84 %
Pre FEV6/FVC Ratio: 98 %
RV % pred: 71 %
RV: 1.81 L
TLC % pred: 50 %
TLC: 3.45 L

## 2019-10-10 NOTE — Progress Notes (Signed)
PFT done today. 

## 2019-10-13 ENCOUNTER — Ambulatory Visit (INDEPENDENT_AMBULATORY_CARE_PROVIDER_SITE_OTHER): Payer: Medicare Other | Admitting: Adult Health

## 2019-10-13 ENCOUNTER — Other Ambulatory Visit: Payer: Self-pay | Admitting: Family Medicine

## 2019-10-13 ENCOUNTER — Encounter: Payer: Self-pay | Admitting: Adult Health

## 2019-10-13 ENCOUNTER — Other Ambulatory Visit: Payer: Self-pay

## 2019-10-13 ENCOUNTER — Encounter (HOSPITAL_COMMUNITY): Payer: Self-pay | Admitting: *Deleted

## 2019-10-13 VITALS — BP 122/68 | HR 67 | Temp 97.6°F | Ht 71.0 in | Wt 295.6 lb

## 2019-10-13 DIAGNOSIS — J8410 Pulmonary fibrosis, unspecified: Secondary | ICD-10-CM

## 2019-10-13 DIAGNOSIS — G4733 Obstructive sleep apnea (adult) (pediatric): Secondary | ICD-10-CM

## 2019-10-13 DIAGNOSIS — J9611 Chronic respiratory failure with hypoxia: Secondary | ICD-10-CM

## 2019-10-13 DIAGNOSIS — J849 Interstitial pulmonary disease, unspecified: Secondary | ICD-10-CM | POA: Diagnosis not present

## 2019-10-13 DIAGNOSIS — J4540 Moderate persistent asthma, uncomplicated: Secondary | ICD-10-CM

## 2019-10-13 DIAGNOSIS — R911 Solitary pulmonary nodule: Secondary | ICD-10-CM

## 2019-10-13 MED ORDER — BREZTRI AEROSPHERE 160-9-4.8 MCG/ACT IN AERO: 2.0000 | INHALATION_SPRAY | Freq: Two times a day (BID) | RESPIRATORY_TRACT | 0 refills | Status: DC

## 2019-10-13 MED ORDER — BREZTRI AEROSPHERE 160-9-4.8 MCG/ACT IN AERO: 2.0000 | INHALATION_SPRAY | Freq: Two times a day (BID) | RESPIRATORY_TRACT | 4 refills | Status: DC

## 2019-10-13 NOTE — Assessment & Plan Note (Signed)
Patient has a history of severe obstructive sleep apnea.  Recent repeat sleep study shows improved sleep apnea but continues to have moderate sleep apnea with significant symptom burden.  Patient education given.  Patient does agree to proceed with CPAP use.  Will order a DreamWear nasal mask as patient feels that he cannot wear a fullface mask  Plan  . Patient Instructions  Refer to pulmonary rehab.  Change Symbicort to BREZTRI 2 puffs Twice daily  , rinse after use.  Begin CPAP At bedtime   Order for Dream wear nasal mask .  Wear CPAP each night all night  Do not drive is sleepy  Work on healthy weight .  Follow up in 3 months with Dr. Elsworth Soho  Or Narmeen Kerper NP and As needed   Please contact office for sooner follow up if symptoms do not improve or worsen or seek emergency care

## 2019-10-13 NOTE — Progress Notes (Signed)
Received referral from Dr. Elsworth Soho for this pt to participate in pulmonary rehab with the the diagnosis of ILD. Pt with hospitalization 03/2019 for Covid - 19 Pneumonia  Clinical review of pt follow up appt on 10/13/19 with Rexene Edison NP with Dr. Elsworth Soho Pulmonary office note.  Pt with Covid Risk Score - 8. Pt appropriate for scheduling for Pulmonary rehab.  Will forward to support staff for verification of insurance eligibility/benefits and pulmonary rehab staff for scheduling with pt consent. Cherre Huger, BSN Cardiac and Training and development officer

## 2019-10-13 NOTE — Assessment & Plan Note (Signed)
Check CT chest in 1 year to follow RUL 8 mm nodule

## 2019-10-13 NOTE — Assessment & Plan Note (Signed)
Pulmonary function testing shows a significant decline in lung function since having COVID-19.  Will change Symbicort to [employer] .   Plan  Patient Instructions  Refer to pulmonary rehab.  Change Symbicort to BREZTRI 2 puffs Twice daily  , rinse after use.  Begin CPAP At bedtime   Order for Dream wear nasal mask .  Wear CPAP each night all night  Do not drive is sleepy  Work on healthy weight .  Follow up in 3 months with Dr. Elsworth Soho  Or Shayonna Ocampo NP and As needed   Please contact office for sooner follow up if symptoms do not improve or worsen or seek emergency care

## 2019-10-13 NOTE — Assessment & Plan Note (Signed)
Healthy weight loss encouraged 

## 2019-10-13 NOTE — Assessment & Plan Note (Signed)
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  Felt to be secondary to recurrent pneumonias in the past.  He also has had COVID-19 in 2020.  He has increased postinflammatory changes.  Notable decrease in pulmonary function testing with drop in his FEV1 FVC and DLCO. We will refer to pulmonary rehab as he has significant physical deconditioning. Continue on pulmonary hygiene regimen. Patient needs weight loss.  Plan  Patient Instructions  Refer to pulmonary rehab.  Change Symbicort to BREZTRI 2 puffs Twice daily  , rinse after use.  Begin CPAP At bedtime   Order for Dream wear nasal mask .  Wear CPAP each night all night  Do not drive is sleepy  Work on healthy weight .  Follow up in 3 months with Dr. Elsworth Soho  Or Hilda Rynders NP and As needed   Please contact office for sooner follow up if symptoms do not improve or worsen or seek emergency care

## 2019-10-13 NOTE — Assessment & Plan Note (Signed)
Continue on oxygen 2 L at bedtime.  Once CPAP is started can continue oxygen with his CPAP.

## 2019-10-13 NOTE — Patient Instructions (Addendum)
Refer to pulmonary rehab.  Change Symbicort to BREZTRI 2 puffs Twice daily  , rinse after use.  Begin CPAP At bedtime   Order for Dream wear nasal mask .  Wear CPAP each night all night  Do not drive is sleepy  Work on healthy weight .  Follow up in 3 months with Dr. Elsworth Soho  Or Kelsie Kramp NP and As needed   Please contact office for sooner follow up if symptoms do not improve or worsen or seek emergency care

## 2019-10-13 NOTE — Progress Notes (Signed)
@Patient  ID: Wayne Booth, male    DOB: 05/30/1941, 79 y.o.   MRN: [id number]  Chief Complaint  Patient presents with  . Follow-up    asthma     Referring provider: Marin Olp, MD  HPI: 79 year old male with very minimal smoking history.  Followed for recurrent pneumonia and asthmatic bronchitis.  Patient has abnormal CT chest with interstitial changes felt to be a component of postinflammatory.  History of severe obstructive sleep apnea with CPAP intolerance Previous PE in 2018 on lifelong anticoagulation Medical history significant for previous pituitary tumor removal, atrial failure, renal cell carcinoma status post nephrectomy and diastolic heart failure [id number] infection with Covid pneumonia treated with remdesivir and Decadron October 2020.     TEST/EVENTS :  CT chest June 08, 2017 showed a small left lower lobe area of consolidation.  PFT 07/2017 moderate restriction, DLCO 67%  CT chest October 2019 small to moderate bilateral pleural effusions consolidations within the left greater than right lower lobe and lingula. Hazy density in the left greater than right lower lobe.  2D echo February 04, 2018 showed EF 50 to 55%  12/2018 HRCT Chest -patchy parenchymal groundglass with interstitial coarsening bilaterally similar to 2019 from a multi lobar pneumonia felt to be a component of postinflammatory changes not consistent with UIP.  Sleep study September 18, 2019 showed moderate sleep apnea with AHI at 26/hour SPO2 low at 82%  10/13/2019 follow-up : Asthma , OSA  Returns for 1 month follow-up.  Patient has chronic obstructive asthmatic bronchitis.  Patient has had lingering symptoms since diagnosis of COVID-17 April 2019.  He gets short of breath with minimal activity has on and off cough and wheezing.  He was treated for asthmatic bronchitic exacerbation last visit with a prednisone taper.  Patient says he really did not notice much difference with the  steroids.  He continues to be very short of breath with minimal activity.  Has intermittent cough and wheezing.  Activity tolerance continues to go downhill.  He is very sedentary.  PFT shows a decline in lung function with FEV1 at 41%, ratio 88, FVC 34%, no significant bronchodilator response, DLCO 61%.  (Previous DLCO 67%, FEV1 61%-2019)  Patient has a known abnormal CT chest with chronic interstitial changes felt to be a component of postinflammatory changes not consistent with UIP as patient has had recurrent pneumonia in the past.  He had a repeat high-resolution CT chest September 06, 2019 that showed her lung zone predominant interstitial thickening, parenchymal groundglass and subpleural reticulation.  No honeycombing.  Findings appear more organized than on May 29, 2019 , poor film as there was significant respiratory motion.  New right upper lobe nodule 8 mm.  Felt to be post COVID-19 inflammatory fibrosis.  Suggestive of alternative diagnoses not UIP.  Patient has known obstructive sleep apnea he has been CPAP intolerant in the past.  But agreed to have a repeat sleep study which was done on September 18, 2019 that showed moderate sleep apnea with AHI at 26/hour.  And SPO2 low at 82%.  We discussed his study results.  Went over treatment options including weight loss oral appliance and CPAP.  He would like to proceed with CPAP.  Says he cannot use a full facemask needs a smaller mask. He complains of daytime sleepiness low energy.  Allergies  Allergen Reactions  . Ambien [Zolpidem] Anxiety    Anxiety and agitation when tried as sleeper in hospital aug 2019    Immunization History  Administered Date(s) Administered  . Fluad Quad(high Dose 65+) 03/16/2019  . Influenza Split 04/01/2011, 03/29/2012  . Influenza Whole 04/12/2007, 03/18/2009, 03/04/2010  . Influenza, High Dose Seasonal PF 04/03/2014, 04/04/2015, 04/24/2016, 04/12/2017, 04/26/2018  . Influenza,inj,Quad PF,6+ Mos 03/22/2013  .  Influenza-Unspecified 05/29/2017  . PFIZER SARS-COV-2 Vaccination 08/19/2019, 09/11/2019  . Pneumococcal Conjugate-13 11/12/2014  . Pneumococcal Polysaccharide-23 06/09/2011  . Pneumococcal-Unspecified 06/30/2015  . Td 06/29/1994  . Tdap 06/09/2011  . Zoster 09/22/2007  . Zoster Recombinat (Shingrix) 03/16/2019, 07/19/2019    Past Medical History:  Diagnosis Date  . Allergy    States his nose runs when he is around grease.   Marland Kitchen Anxiety   . Arthritis   . Atrial fibrillation ([place])   . Cancer ([place])   . Coronary artery disease   . History of total knee replacement   . Hypertension   . Hypothyroidism   . Kidney cancer, primary, with metastasis from kidney to other site [facility])   . Kidney stone   . OSA (obstructive sleep apnea)    pt does not wear cpap at night  . Pituitary cyst ([place])   . Pneumonia    hx of  . Prostate cancer ([place])     Tobacco History: Social History   Tobacco Use  Smoking Status Never Smoker  Smokeless Tobacco Never Used   Counseling given: Not Answered   Outpatient Medications Prior to Visit  Medication Sig Dispense Refill  . albuterol (VENTOLIN HFA) 108 (90 Base) MCG/ACT inhaler Inhale 2 puffs into the lungs every 6 (six) hours as needed for wheezing or shortness of breath. 18 g 2  . budesonide-formoterol (SYMBICORT) 160-4.5 MCG/ACT inhaler Inhale 2 puffs into the lungs 2 (two) times daily. 1 Inhaler 5  . busPIRone (BUSPAR) 5 MG tablet TAKE ONE TABLET BY MOUTH TWICE A DAY AS NEEDED FOR ANXIETY 90 tablet 2  . ELIQUIS 5 MG TABS tablet TAKE ONE TABLET BY MOUTH TWICE A DAY 60 tablet 10  . escitalopram (LEXAPRO) 20 MG tablet Take 1 tablet (20 mg total) by mouth daily. 90 tablet 1  . furosemide (LASIX) 40 MG tablet TAKE 1-2 TABELTS BY MOUTH TWO TIMES A DAY 120 tablet 4  . gabapentin (NEURONTIN) 100 MG capsule Take 3 capsules (300 mg total) by mouth at bedtime. 90 capsule 5  . KLOR-CON M20 20 MEQ tablet TAKE ONE TABLET BY MOUTH TWICE A DAY 120 tablet 0  .  levothyroxine (SYNTHROID) 150 MCG tablet TAKE ONE TABLET BY MOUTH DAILY 90 tablet 0  . metFORMIN (GLUCOPHAGE) 500 MG tablet Take 1 tablet (500 mg total) by mouth daily with breakfast. 90 tablet 3  . montelukast (SINGULAIR) 10 MG tablet Take 1 tablet (10 mg total) by mouth daily. 30 tablet 11  . Multiple Vitamins-Minerals (MULTIVITAMINS THER. W/MINERALS) TABS Take 1 tablet by mouth daily.    . nortriptyline (PAMELOR) 50 MG capsule Take 1 capsule (50 mg total) by mouth at bedtime. 90 capsule 1  . predniSONE (DELTASONE) 10 MG tablet 4 tabs for 2 days, then 3 tabs for 2 days, 2 tabs for 2 days, then 1 tab for 2 days, then stop 20 tablet 0  . pregabalin (LYRICA) 75 MG capsule Take 1 capsule (75 mg total) by mouth 2 (two) times daily. 60 capsule 2  . propranolol (INDERAL) 10 MG tablet TAKE ONE TABLET BY MOUTH TWICE A DAY 120 tablet 0  . Spacer/Aero-Holding Chambers (AEROCHAMBER MV) inhaler Use as instructed 1 each 0  . traMADol (ULTRAM) 50 MG tablet  Take 1 tablet (50 mg total) by mouth every 12 (twelve) hours as needed for moderate pain. 30 tablet 1  . traZODone (DESYREL) 50 MG tablet TAKE ONE-HALF TO 1 TABLET BY MOUTH AT BEDTIME AS NEEDED FOR SLEEP 90 tablet 3   No facility-administered medications prior to visit.     Review of Systems:   Constitutional:   No  weight loss, night sweats,  Fevers, chills,  +fatigue, or  lassitude.  HEENT:   No headaches,  Difficulty swallowing,  Tooth/dental problems, or  Sore throat,                No sneezing, itching, ear ache, nasal congestion, post nasal drip,   CV:  No chest pain,  Orthopnea, PND, +swelling in lower extremities, anasarca, dizziness, palpitations, syncope.   GI  No heartburn, indigestion, abdominal pain, nausea, vomiting, diarrhea, change in bowel habits, loss of appetite, bloody stools.   Resp: .  No chest wall deformity  Skin: no rash or lesions.  GU: no dysuria, change in color of urine, no urgency or frequency.  No flank pain, no  hematuria   MS:  No joint pain or swelling.  No decreased range of motion.  No back pain.    Physical Exam  BP 122/68 (BP Location: Left Arm, Cuff Size: Normal)   Pulse 67   Temp 97.6 F (36.4 C) (Temporal)   Ht 5\' 11"  (1.803 m)   Wt 295 lb 9.6 oz (134.1 kg)   SpO2 95% Comment: RA  BMI 41.23 kg/m   GEN: A/Ox3; pleasant , NAD, elderly   HEENT:  [place]/AT, NOSE-clear, THROAT-clear, no lesions, no postnasal drip or exudate noted.   NECK:  Supple w/ fair ROM; no JVD; normal carotid impulses w/o bruits; no thyromegaly or nodules palpated; no lymphadenopathy.    RESP  BB crackles   no accessory muscle use, no dullness to percussion  CARD:  RRR, no m/r/g, trace peripheral edema, pulses intact, no cyanosis or clubbing.  Stasis dermatitis changes  GI:   Soft & nt; nml bowel sounds; no organomegaly or masses detected.   Musco: Warm bil, no deformities or joint swelling noted.   Neuro: alert, no focal deficits noted.    Skin: Warm, no lesions or rashes    Lab Results:  CBC  BMET  Imaging: SLEEP STUDY DOCUMENTS  Result Date: 09/26/2019 Ordered by an unspecified provider.     PFT Results Latest Ref Rng & Units 10/10/2019 08/11/2017  FVC-Pre L 1.43 2.20  FVC-Predicted Pre % 35 54  FVC-Post L 1.36 2.16  FVC-Predicted Post % 34 53  Pre FEV1/FVC % % 84 79  Post FEV1/FCV % % 88 84  FEV1-Pre L 1.20 1.74  FEV1-Predicted Pre % 41 59  FEV1-Post L 1.20 1.81  DLCO UNC% % 61 67  DLCO COR %Predicted % 129 132  TLC L 3.45 4.77  TLC % Predicted % 50 69  RV % Predicted % 71 96    No results found for: NITRICOXIDE      Assessment & Plan:   Asthmatic bronchitis Pulmonary function testing shows a significant decline in lung function since having COVID-19.  Will change Symbicort to [employer] .   Plan  Patient Instructions  Refer to pulmonary rehab.  Change Symbicort to BREZTRI 2 puffs Twice daily  , rinse after use.  Begin CPAP At bedtime   Order for Dream wear nasal mask  .  Wear CPAP each night all night  Do not drive is  sleepy  Work on healthy weight .  Follow up in 3 months with Dr. Elsworth Soho  Or Liliauna Santoni NP and As needed   Please contact office for sooner follow up if symptoms do not improve or worsen or seek emergency care       OSA (obstructive sleep apnea) Patient has a history of severe obstructive sleep apnea.  Recent repeat sleep study shows improved sleep apnea but continues to have moderate sleep apnea with significant symptom burden.  Patient education given.  Patient does agree to proceed with CPAP use.  Will order a DreamWear nasal mask as patient feels that he cannot wear a fullface mask  Plan  . Patient Instructions  Refer to pulmonary rehab.  Change Symbicort to BREZTRI 2 puffs Twice daily  , rinse after use.  Begin CPAP At bedtime   Order for Dream wear nasal mask .  Wear CPAP each night all night  Do not drive is sleepy  Work on healthy weight .  Follow up in 3 months with Dr. Elsworth Soho  Or Chamberlain Steinborn NP and As needed   Please contact office for sooner follow up if symptoms do not improve or worsen or seek emergency care       Chronic respiratory failure with hypoxia ([place]) Continue on oxygen 2 L at bedtime.  Once CPAP is started can continue oxygen with his CPAP.  Pulmonary fibrosis, 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HCC) Postinflammatory fibrosis patient has interstitial changes on CT chest that is chronic.  Felt to be secondary to recurrent pneumonias in the past.  He also has had COVID-19 in 2020.  He has increased postinflammatory changes.  Notable decrease in pulmonary function testing with drop in his FEV1 FVC and DLCO. We will refer to pulmonary rehab as he has significant physical deconditioning. Continue on pulmonary hygiene regimen. Patient needs weight loss.  Plan  Patient Instructions  Refer to pulmonary rehab.  Change Symbicort to BREZTRI 2 puffs Twice daily  , rinse after use.  Begin CPAP At bedtime   Order for Dream wear nasal mask .   Wear CPAP each night all night  Do not drive is sleepy  Work on healthy weight .  Follow up in 3 months with Dr. Elsworth Soho  Or Braydan Marriott NP and As needed   Please contact office for sooner follow up if symptoms do not improve or worsen or seek emergency care       Morbid obesity ([place]) Healthy weight loss encouraged     Rexene Edison, NP 10/13/2019

## 2019-10-16 ENCOUNTER — Telehealth (HOSPITAL_COMMUNITY): Payer: Self-pay

## 2019-10-16 NOTE — Telephone Encounter (Signed)
LAST APPOINTMENT DATE: @2 /1/21  NEXT APPOINTMENT DATE:@6 /06/2019   LAST REFILL: 11/14/2018  QTY: #90 3rf

## 2019-10-16 NOTE — Telephone Encounter (Signed)
Pt insurance is active and benefits verified through [facility] Medicare Co-pay $20, DED 0/0 met, out of pocket $3,600/$542.27 met, co-insurance 0%. no pre-authorization required, REF# [id number]

## 2019-10-18 ENCOUNTER — Telehealth (HOSPITAL_COMMUNITY): Payer: Self-pay | Admitting: *Deleted

## 2019-10-19 ENCOUNTER — Other Ambulatory Visit: Payer: Self-pay | Admitting: Family Medicine

## 2019-10-19 ENCOUNTER — Telehealth (HOSPITAL_COMMUNITY): Payer: Self-pay

## 2019-10-26 IMAGING — DX DG CHEST 1V PORT
1 series · 1 of 1 positions shown · non-contrast
Comparison: CTA chest 10/20/2016 and earlier.

CLINICAL DATA: 76-year-old male with palpitations, dizziness,
tachycardia, shortness of breath.

EXAM:
PORTABLE CHEST 1 VIEW

[chest ap]
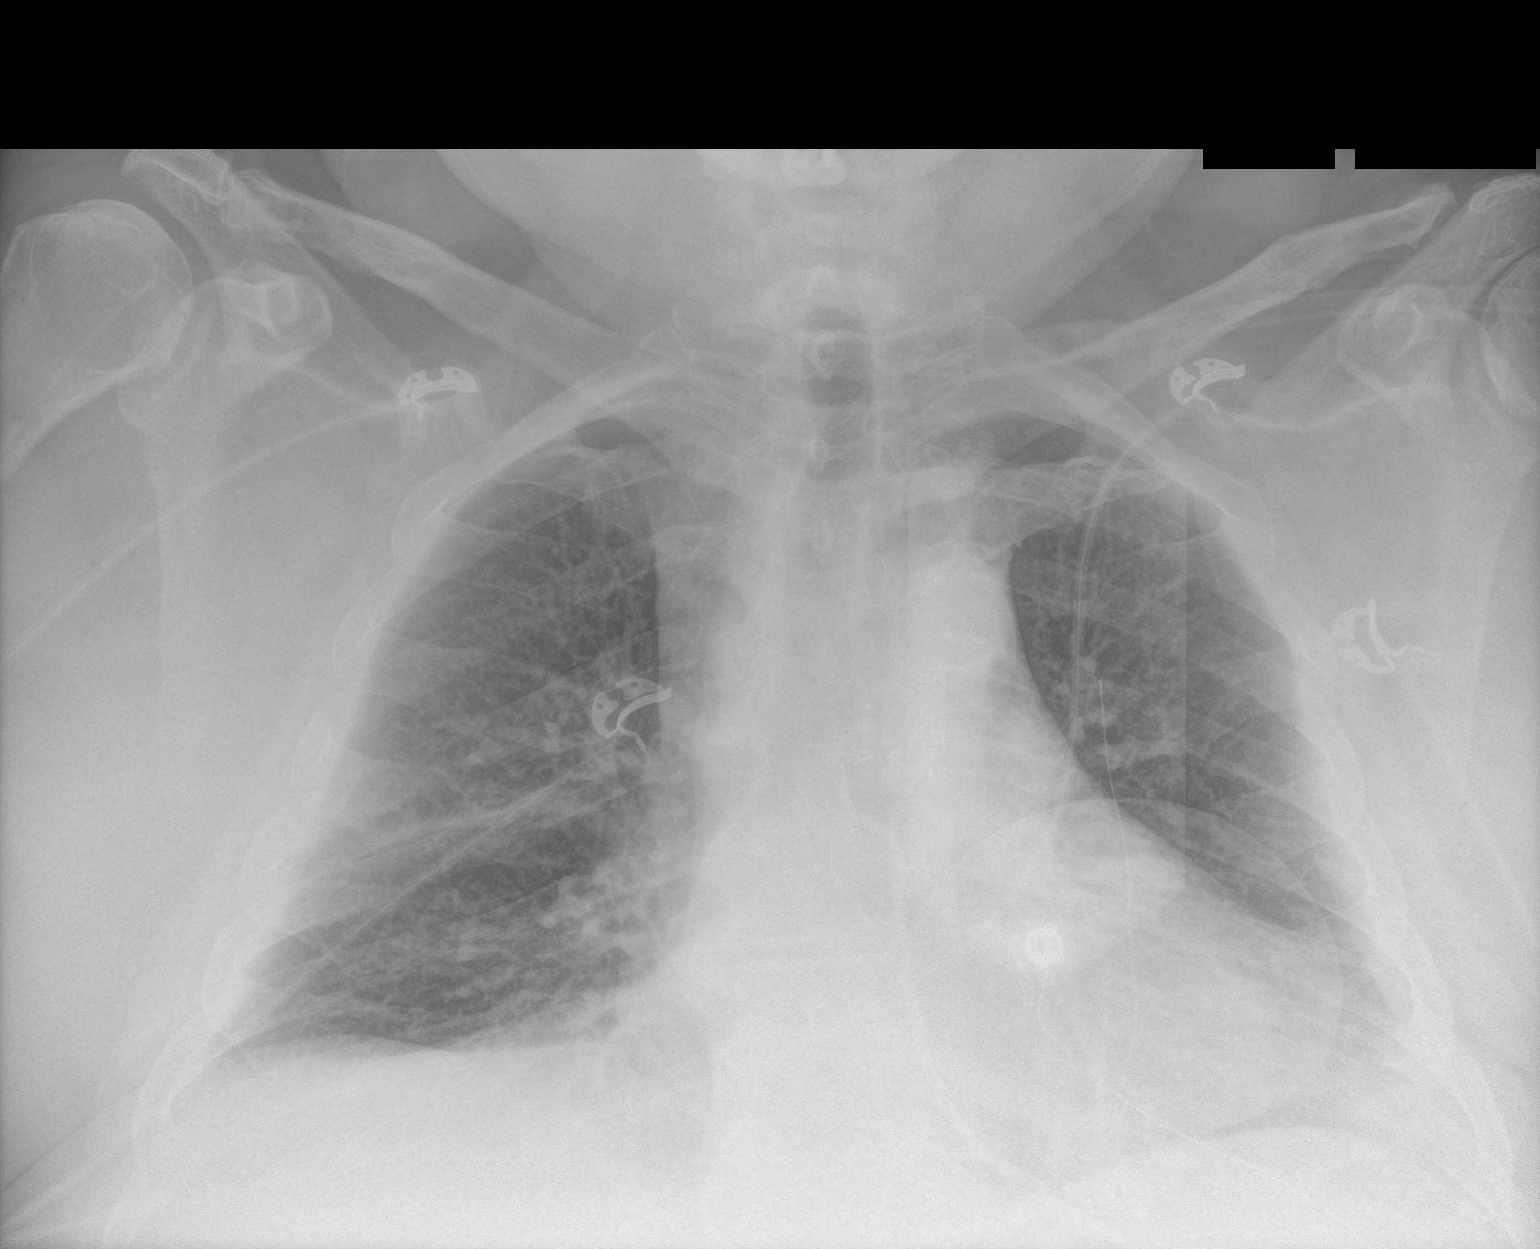

[1 of 1 positions shown; findings below may reference images not displayed]

FINDINGS: Portable AP semi upright view at 2222 hours. Mildly lower lung
volumes. Stable cardiac size and mediastinal contours. Pacer or
resuscitation pads project over the left chest. No pneumothorax,
pulmonary edema, pleural effusion or confluent pulmonary opacity.
Visualized tracheal air column is within normal limits. Cervical
ACDF hardware. No acute osseous abnormality identified.
IMPRESSION: Lower lung volumes.  No acute cardiopulmonary abnormality.

## 2019-10-30 ENCOUNTER — Other Ambulatory Visit: Payer: Self-pay | Admitting: Podiatry

## 2019-11-02 DIAGNOSIS — G4733 Obstructive sleep apnea (adult) (pediatric): Secondary | ICD-10-CM | POA: Diagnosis not present

## 2019-11-07 ENCOUNTER — Telehealth (HOSPITAL_COMMUNITY): Payer: Self-pay | Admitting: *Deleted

## 2019-11-07 NOTE — Telephone Encounter (Signed)
LM to remind patient that he has an appointment 11/08/2019 @ 9am for walk test/orientation in pulmonary rehab.  Asked to confirm this by calling us back @ [phone].

## 2019-11-08 ENCOUNTER — Inpatient Hospital Stay (HOSPITAL_COMMUNITY)
Admission: RE | Admit: 2019-11-08 | Discharge: 2019-11-08 | Disposition: A | Discharge status: Home / Self Care | Payer: Medicare Other | Source: Ambulatory Visit | Attending: Pulmonary Disease | Admitting: Pulmonary Disease

## 2019-11-08 NOTE — Progress Notes (Signed)
Patient was a no show, no call for orientation/walk test for pulmonary rehab, referral is being closed.

## 2019-11-09 ENCOUNTER — Telehealth (HOSPITAL_COMMUNITY): Payer: Self-pay | Admitting: *Deleted

## 2019-11-09 NOTE — Telephone Encounter (Signed)
I charted the wrong return phone number on my previous telephone, it actually was [phone].

## 2019-11-13 ENCOUNTER — Other Ambulatory Visit: Payer: Self-pay | Admitting: Family Medicine

## 2019-11-14 ENCOUNTER — Telehealth (HOSPITAL_COMMUNITY): Payer: Self-pay | Admitting: *Deleted

## 2019-11-14 NOTE — Telephone Encounter (Signed)
LMTCB to reschedule orientation/walk test for pulmonary rehab.

## 2019-11-16 ENCOUNTER — Telehealth (HOSPITAL_COMMUNITY): Payer: Self-pay

## 2019-11-21 NOTE — Progress Notes (Deleted)
Phone [phone] In person visit   Subjective:   Wayne Booth is a 79 y.o. year old very pleasant male patient who presents for/with See problem oriented charting No chief complaint on file.   This visit occurred during the SARS-CoV-2 public health emergency.  Safety protocols were in place, including screening questions prior to the visit, additional usage of staff PPE, and extensive cleaning of exam room while observing appropriate contact time as indicated for disinfecting solutions.   Past Medical History-  Patient Active Problem List   Diagnosis Date Noted  . Pulmonary fibrosis, 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[place]) 10/13/2019  . Pulmonary nodule 10/13/2019  . Pneumonia due to COVID-19 virus 04/20/2019  . PTSD (post-traumatic stress disorder) 07/27/2018  . Memory loss 06/10/2018  . Aortic atherosclerosis ([place]) 05/10/2018  . Chronic respiratory failure with hypoxia ([place]) 04/26/2018  . First degree AV block 04/15/2018  . Hyponatremia 04/15/2018  . Pleural effusion 04/14/2018  . CAD (coronary artery disease) 04/09/2018  . Chronic pain 04/09/2018  . CKD (chronic kidney disease) stage 3, GFR 30-59 ml/min   . Dyspnea   . HCAP (healthcare-associated pneumonia) 02/03/2018  . Constipation 01/21/2018  . Chest pain   . Anticoagulated   . Atrial flutter ([place]) 11/01/2017  . Pain in left knee 07/15/2017  . Bilateral foot pain 07/13/2017  . Community acquired pneumonia 06/18/2017  . 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 VTE (venous thromboembolism) 06/09/2017  . History of adenomatous polyp of colon 04/12/2017  . Chronic pulmonary embolism ([place]) 10/23/2016  . Cervical disc disease 11/13/2015  . Essential tremor 08/14/2015  . Gout 08/14/2015  . Hypothyroidism 07/12/2014  . Hyperglycemia 07/12/2014  . GAD (generalized anxiety disorder) 11/07/2013  . OSA (obstructive sleep apnea) 07/07/2011  . Asthmatic bronchitis 06/09/2011  . Actinic keratosis 11/27/2009  . Osteoarthritis 11/13/2008    . History of prostate cancer-seed implantation 2010.  Dr. Alinda Money 10/02/2008  . Morbid obesity ([place]) 10/02/2008  . History of renal cell carcinoma 12/26/2007  . Insomnia 12/26/2007  . TESTOSTERONE DEFICIENCY 12/28/2006  . Essential hypertension 12/28/2006    Medications- reviewed and updated Current Outpatient Medications  Medication Sig Dispense Refill  . albuterol (VENTOLIN HFA) 108 (90 Base) MCG/ACT inhaler Inhale 2 puffs into the lungs every 6 (six) hours as needed for wheezing or shortness of breath. 18 g 2  . Budeson-Glycopyrrol-Formoterol (BREZTRI AEROSPHERE) 160-9-4.8 MCG/ACT AERO Inhale 2 puffs into the lungs in the morning and at bedtime. 10.7 g 0  . Budeson-Glycopyrrol-Formoterol (BREZTRI AEROSPHERE) 160-9-4.8 MCG/ACT AERO Inhale 2 puffs into the lungs in the morning and at bedtime. 10.7 g 4  . budesonide-formoterol (SYMBICORT) 160-4.5 MCG/ACT inhaler Inhale 2 puffs into the lungs 2 (two) times daily. 1 Inhaler 5  . busPIRone (BUSPAR) 5 MG tablet TAKE ONE TABLET BY MOUTH TWICE A DAY AS NEEDED FOR ANXIETY 90 tablet 2  . ELIQUIS 5 MG TABS tablet TAKE ONE TABLET BY MOUTH TWICE A DAY 60 tablet 10  . escitalopram (LEXAPRO) 20 MG tablet Take 1 tablet (20 mg total) by mouth daily. 90 tablet 1  . furosemide (LASIX) 40 MG tablet TAKE 1-2 TABELTS BY MOUTH TWO TIMES A DAY 120 tablet 4  . gabapentin (NEURONTIN) 100 MG capsule Take 3 capsules (300 mg total) by mouth at bedtime. 90 capsule 5  . KLOR-CON M20 20 MEQ tablet TAKE ONE TABLET BY MOUTH TWICE A DAY 60 tablet 2  . levothyroxine (SYNTHROID) 150 MCG tablet TAKE ONE TABLET BY MOUTH DAILY 90 tablet 0  . metFORMIN (GLUCOPHAGE)  500 MG tablet Take 1 tablet (500 mg total) by mouth daily with breakfast. 90 tablet 3  . montelukast (SINGULAIR) 10 MG tablet Take 1 tablet (10 mg total) by mouth daily. 30 tablet 11  . Multiple Vitamins-Minerals (MULTIVITAMINS THER. W/MINERALS) TABS Take 1 tablet by mouth daily.    . nortriptyline (PAMELOR) 50 MG  capsule Take 1 capsule (50 mg total) by mouth at bedtime. 90 capsule 1  . predniSONE (DELTASONE) 10 MG tablet 4 tabs for 2 days, then 3 tabs for 2 days, 2 tabs for 2 days, then 1 tab for 2 days, then stop 20 tablet 0  . pregabalin (LYRICA) 75 MG capsule TAKE ONE CAPSULE BY MOUTH TWICE A DAY 60 capsule 1  . propranolol (INDERAL) 10 MG tablet TAKE ONE TABLET BY MOUTH TWICE A DAY 120 tablet 0  . Spacer/Aero-Holding Chambers (AEROCHAMBER MV) inhaler Use as instructed 1 each 0  . traMADol (ULTRAM) 50 MG tablet Take 1 tablet (50 mg total) by mouth every 12 (twelve) hours as needed for moderate pain. 30 tablet 1  . traZODone (DESYREL) 50 MG tablet TAKE ONE-HALF TO 1 FULL TABLET AT BEDTIME AS NEEDED FOR SLEEP 90 tablet 2   No current facility-administered medications for this visit.     Objective:  There were no vitals taken for this visit. Gen: NAD, resting comfortably CV: RRR no murmurs rubs or gallops Lungs: CTAB no crackles, wheeze, rhonchi Abdomen: soft/nontender/nondistended/normal bowel sounds. No rebound or guarding.  Ext: no edema Skin: warm, dry Neuro: grossly normal, moves all extremities  ***    Assessment and Plan   #Heart failure with *** ejection fraction # Chronic respiratory failure- continues on 02 at night after covid 19- no daytime oxgyen- also could be related to asthmatic bronchitis on betrizi instead of symbicort*** followed by pulmonology Dr. Elsworth Soho. ***referred to pulmonary rehab. Also has OSA on cpap S: Medication:*** furosemide 40mg  BID.  Potassium twice daily  Edema: *** Weight gain:*** Shortness of breath: *** Orthopnea/PND: ***  A/P: CHF-***  Chronic respiratory failure- ***   # Atrial fibrillation #Chronic PE- history PE following hand surgery  In past S: Rate controlled with *** propranolol Anticoagulated with *** eliquis 5 mg BID Chadsvasc score of *** Patient is *** followed by cardiology: ***  A/P: ***   #Neuropathy- feet #Cervical disc  disease- working to titrate down nortriptyline due to serotonin syndrome risk. Also on gabapentin S: Last visit patient was complaining of burning in his feet particularly when they swell and at times even when not swelling.  Seem to worsen will reduce nortriptyline. Podiatry started Lyrica. Already on gabapentin for neck issues. Pain at last visit was 5 out of 10 during the day and 8 out of 10 at night.  He also had tramadol available for pain which I recommended to use sparingly due to risk of serotonin syndrome with other medications.  B12 has been normal. A1c in prediabetes range. T3 and T4 normal.  We have discussed possible neurology referral***  A/P: ***   # Depression # Generalized anxiety disorder  S: Medication:Lexapro 20Mg , nortriptyline 50mg  down from 75 mg- for cervical disc disease primarily, Buspirone 5Mg  once or twice a week.   Also on trazodone for sleep Depression screen [facility] 2/9 07/31/2019 06/21/2019 05/05/2019  Decreased Interest 0 0 3  Down, Depressed, Hopeless 0 0 0  PHQ - 2 Score 0 0 3  Altered sleeping 0 0 3  Tired, decreased energy 0 1 3  Change in appetite 0  0 3  Feeling bad or failure about yourself  0 0 0  Trouble concentrating 0 0 1  Moving slowly or fidgety/restless 0 0 0  Suicidal thoughts 0 0 -  PHQ-9 Score 0 1 13  Difficult doing work/chores Not difficult at all Not difficult at all -  Some recent data might be hidden   GAD 7 : Generalized Anxiety Score 12/16/2017  Nervous, Anxious, on Edge 1  Control/stop worrying 0  Worry too much - different things 0  Trouble relaxing 0  Restless 0  Easily annoyed or irritable 1  Afraid - awful might happen 1  Total GAD 7 Score 3  Anxiety Difficulty Not difficult at all  A/P: ***  -cyclobenzaprine increases risk of serotonin syndrome *** -tramadol used only once or twice a week ***  # Hyperglycemia/insulin resistance/prediabetes S:  Medication: *** metformin 500mg  daily Exercise and diet- *** Lab  Results  Component Value Date   HGBA1C 6.3 07/31/2019   HGBA1C 5.9 05/10/2018   HGBA1C 6.3 12/16/2017   A/P: ***  # postoperative  Hypothyroidism- cannot use TSH S: compliant On thyroid medication-Levothyroxine 150Mg   ROS-No hair or nail changes. No heat/cold intolerance. No constipation or diarrhea. Denies shakiness or anxiety. *** A/P:***   #hypertension S: medication: *** proranolol 10mg  BID Home readings #s: *** BP Readings from Last 3 Encounters:  10/13/19 122/68  08/04/19 126/64  07/31/19 138/70  A/P: ***  #Chronic kidney disease stage III- unilateral kidney S: GFR is typically in the ***range -Patient knows to avoid NSAIDs***  A/P: ***   #Memory loss-intermittent issues with memory after multiple hospitalizations since 2019. MRI of the brain January 2020 largely unremarkable. B12 levels have been normal. Issues even when depression well controlled. -If worsens would plan on MMSE***   #Gout S: *** flares in *** on *** Lab Results  Component Value Date   LABURIC 8.6 (H) 12/05/2017   A/P:***   #allergies- on singulair- discussed risks to mood on ***  Recommended follow up: ***No follow-ups on file. Future Appointments  Date Time Provider Reader  11/28/2019  9:40 AM Marin Olp, MD [facility]  01/12/2020 11:00 AM Parrett, Fonnie Mu, NP [facility] None    Lab/Order associations: No diagnosis found.  No orders of the defined types were placed in this encounter.   Return precautions advised.  Clyde Lundborg, CMA

## 2019-11-24 ENCOUNTER — Other Ambulatory Visit: Payer: Self-pay

## 2019-11-28 ENCOUNTER — Telehealth (HOSPITAL_COMMUNITY): Payer: Self-pay

## 2019-11-28 ENCOUNTER — Ambulatory Visit: Payer: Medicare Other | Admitting: Family Medicine

## 2019-11-28 DIAGNOSIS — Z0289 Encounter for other administrative examinations: Secondary | ICD-10-CM

## 2019-12-03 DIAGNOSIS — G4733 Obstructive sleep apnea (adult) (pediatric): Secondary | ICD-10-CM | POA: Diagnosis not present

## 2019-12-07 ENCOUNTER — Encounter: Payer: Self-pay | Admitting: Podiatry

## 2019-12-07 ENCOUNTER — Other Ambulatory Visit: Payer: Self-pay

## 2019-12-07 ENCOUNTER — Telehealth (HOSPITAL_COMMUNITY): Payer: Self-pay | Admitting: *Deleted

## 2019-12-07 ENCOUNTER — Ambulatory Visit: Payer: Medicare Other | Admitting: Podiatry

## 2019-12-07 DIAGNOSIS — B351 Tinea unguium: Secondary | ICD-10-CM

## 2019-12-07 DIAGNOSIS — G629 Polyneuropathy, unspecified: Secondary | ICD-10-CM | POA: Diagnosis not present

## 2019-12-07 DIAGNOSIS — Z7901 Long term (current) use of anticoagulants: Secondary | ICD-10-CM

## 2019-12-07 DIAGNOSIS — M79674 Pain in right toe(s): Secondary | ICD-10-CM | POA: Diagnosis not present

## 2019-12-07 DIAGNOSIS — M79675 Pain in left toe(s): Secondary | ICD-10-CM

## 2019-12-07 MED ORDER — PREGABALIN 75 MG PO CAPS: 75.0000 mg | ORAL_CAPSULE | Freq: Two times a day (BID) | ORAL | 1 refills | Status: DC

## 2019-12-07 NOTE — Patient Instructions (Signed)
Pregabalin capsules What is this medicine? PREGABALIN (pre GAB a lin) is used to treat nerve pain from diabetes, shingles, spinal cord injury, and fibromyalgia. It is also used to control seizures in epilepsy. This medicine may be used for other purposes; ask your health care provider or pharmacist if you have questions. COMMON BRAND NAME(S): Lyrica What should I tell my health care provider before I take this medicine? They need to know if you have any of these conditions:  heart disease  history of drug abuse or alcohol abuse problem  kidney disease  lung or breathing disease  suicidal thoughts, plans, or attempt; a previous suicide attempt by you or a family member  an unusual or allergic reaction to pregabalin, gabapentin, other medicines, foods, dyes, or preservatives  pregnant or trying to get pregnant  breast-feeding How should I use this medicine? Take this medicine by mouth with a glass of water. Follow the directions on the prescription label. You can take it with or without food. If it upsets your stomach, take it with food. Take your medicine at regular intervals. Do not take it more often than directed. Do not stop taking except on your doctor's advice. A special MedGuide will be given to you by the pharmacist with each prescription and refill. Be sure to read this information carefully each time. Talk to your pediatrician regarding the use of this medicine in children. While this drug may be prescribed for children as young as 1 month for selected conditions, precautions do apply. Overdosage: If you think you have taken too much of this medicine contact a poison control center or emergency room at once. NOTE: This medicine is only for you. Do not share this medicine with others. What if I miss a dose? If you miss a dose, take it as soon as you can. If it is almost time for your next dose, take only that dose. Do not take double or extra doses. What may interact with this  medicine? This medicine may interact with the following medications:  alcohol  antihistamines for allergy, cough, and cold  certain medicines for anxiety or sleep  certain medicines for depression like amitriptyline, fluoxetine, sertraline  certain medicines for diabetes  certain medicines for seizures like phenobarbital, primidone  general anesthetics like halothane, isoflurane, methoxyflurane, propofol  local anesthetics like lidocaine, pramoxine, tetracaine  medicines that relax muscles for surgery  narcotic medicines for pain  phenothiazines like chlorpromazine, mesoridazine, prochlorperazine, thioridazine This list may not describe all possible interactions. Give your health care provider a list of all the medicines, herbs, non-prescription drugs, or dietary supplements you use. Also tell them if you smoke, drink alcohol, or use illegal drugs. Some items may interact with your medicine. What should I watch for while using this medicine? Tell your doctor or healthcare professional if your symptoms do not start to get better or if they get worse. Visit your doctor or health care professional for regular checks on your progress. Do not stop taking except on your doctor's advice. You may develop a severe reaction. Your doctor will tell you how much medicine to take. Wear a medical identification bracelet or chain if you are taking this medicine for seizures, and carry a card that describes your disease and details of your medicine and dosage times. You may get drowsy or dizzy. Do not drive, use machinery, or do anything that needs mental alertness until you know how this medicine affects you. Do not stand or sit up quickly, especially if   you are an older patient. This reduces the risk of dizzy or fainting spells. Alcohol may interfere with the effect of this medicine. Avoid alcoholic drinks. If you have a heart condition, like congestive heart failure, and notice that you are retaining  water and have swelling in your hands or feet, contact your health care provider immediately. The use of this medicine may increase the chance of suicidal thoughts or actions. Pay special attention to how you are responding while on this medicine. Any worsening of mood, or thoughts of suicide or dying should be reported to your health care professional right away. This medicine has caused reduced sperm counts in some men. This may interfere with the ability to father a child. You should talk to your doctor or health care professional if you are concerned about your fertility. Women who become pregnant while using this medicine for seizures may enroll in the [place] Antiepileptic Drug Pregnancy Registry by calling 1-888-233-2334. This registry collects information about the safety of antiepileptic drug use during pregnancy. What side effects may I notice from receiving this medicine? Side effects that you should report to your doctor or health care professional as soon as possible:  allergic reactions like skin rash, itching or hives, swelling of the face, lips, or tongue  breathing problems  changes in vision  chest pain  confusion  jerking or unusual movements of any part of your body  loss of memory  muscle pain, tenderness, or weakness  suicidal thoughts or other mood changes  swelling of the ankles, feet, hands  unusual bruising or bleeding Side effects that usually do not require medical attention (report to your doctor or health care professional if they continue or are bothersome):  dizziness  drowsiness  dry mouth  headache  nausea  tremors  trouble sleeping  weight gain This list may not describe all possible side effects. Call your doctor for medical advice about side effects. You may report side effects to FDA at 1-800-FDA-1088. Where should I keep my medicine? Keep out of the reach of children. This medicine can be abused. Keep your medicine in a safe  place to protect it from theft. Do not share this medicine with anyone. Selling or giving away this medicine is dangerous and against the law. This medicine may cause accidental overdose and death if it taken by other adults, children, or pets. Mix any unused medicine with a substance like cat litter or coffee grounds. Then throw the medicine away in a sealed container like a sealed bag or a coffee can with a lid. Do not use the medicine after the expiration date. Store at room temperature between 15 and 30 degrees C (59 and 86 degrees F). NOTE: This sheet is a summary. It may not cover all possible information. If you have questions about this medicine, talk to your doctor, pharmacist, or health care provider.  2020 [employer]vier/Gold Standard (2018-06-17 13:15:55)  

## 2019-12-07 NOTE — Telephone Encounter (Signed)
Called to remind of appointment in pulmonary rehab 12/13/2019 @ 0900, LM.

## 2019-12-07 NOTE — Progress Notes (Signed)
Subjective: 79 year old male presents the office today first main concern of burning to the bottoms of his feet worse at nighttime comfortably sleeping because of this.  Currently on gabapentin 100 mg 3 times a day has not been helpful.  He has not been taking any other medication including Lyrica.  He was discussed options at this time.  No recent injury or falls.  No swelling.  Also as a result extremities are thickened elongated he cannot do them himself.  No ulcerations. Denies any systemic complaints such as fevers, chills, nausea, vomiting. No acute changes since last appointment, and no other complaints at this time.   Objective: AAO x3, NAD DP/PT pulses palpable bilaterally, CRT less than 3 seconds Sensation decreased with Semmes Weinstein monofilament  There is no area of pinpoint tenderness.  No pain with flexor, extensor range of motion.  MMT 5/5.   Nails are hypertrophic, dystrophic, brittle, discolored, elongated 10. No surrounding redness or drainage. Tenderness nails 1-5 bilaterally. No open lesions or pre-ulcerative lesions are identified today. Decreased range of motion of procedures.  No pain today. No pain with calf compression, swelling, warmth, erythema  Assessment: Symptomatic neuropathy, symptomatic onychomycosis  Plan: -All treatment options discussed with the patient including all alternatives, risks, complications.  -Discussed weaning off of gabapentin restart Lyrica 75 mg twice a day we discussed side effects around the monitor for. -Debrided nails U11 with any complications or bleeding. -Patient encouraged to call the office with any questions, concerns, change in symptoms.   Return in about 2 months (around 02/06/2020).  Trula Slade DPM

## 2019-12-11 ENCOUNTER — Telehealth: Payer: Self-pay | Admitting: *Deleted

## 2019-12-11 ENCOUNTER — Telehealth: Payer: Self-pay | Admitting: Podiatry

## 2019-12-11 NOTE — Telephone Encounter (Signed)
If he cannot tolerate the lyrica then have him stop. We can either go back to the gabapentin but a higher dose or switch to cymbalta. Let me know what he wants to do.

## 2019-12-11 NOTE — Telephone Encounter (Signed)
Pt called and left voicemail and stated that they can not take the lyrica that was given due to all the side effects would like to know if there is something else that can be taken

## 2019-12-11 NOTE — Telephone Encounter (Signed)
Left message informing pt I would pass his message to Dr. Jacqualyn Posey and if he got a chance to call with the side effects so Dr. Jacqualyn Posey would know what to avoid.

## 2019-12-11 NOTE — Telephone Encounter (Signed)
male caller states Wayne Booth can't take the Lyrica, he already has some of the side effects.

## 2019-12-12 ENCOUNTER — Other Ambulatory Visit: Payer: Self-pay | Admitting: Podiatry

## 2019-12-12 MED ORDER — DULOXETINE HCL 30 MG PO CPEP: 30.0000 mg | ORAL_CAPSULE | Freq: Every day | ORAL | 3 refills | Status: DC

## 2019-12-12 NOTE — Telephone Encounter (Signed)
This message answered in 12/11/2019 12:49pm.

## 2019-12-12 NOTE — Telephone Encounter (Signed)
I have sent cymbalta to the pharmacy. Please let him know that if he has any side affects to stop the medication and let me know. If he doesn't get relief with this then we should refer to neurology.

## 2019-12-12 NOTE — Telephone Encounter (Signed)
Left message informing pt of Dr. Leigh Aurora orders of 12/11/2019 5:43pm.

## 2019-12-12 NOTE — Telephone Encounter (Signed)
I spoke with pt's wife, Jeral Fruit and she states they would like to switch to Cymbalta.

## 2019-12-12 NOTE — Telephone Encounter (Signed)
Left message informing pt of Dr. Wagoner's orders. 

## 2019-12-13 ENCOUNTER — Other Ambulatory Visit: Payer: Self-pay

## 2019-12-13 ENCOUNTER — Encounter (HOSPITAL_COMMUNITY)
Admission: RE | Admit: 2019-12-13 | Discharge: 2019-12-13 | Disposition: A | Discharge status: Home / Self Care | Payer: Medicare Other | Source: Ambulatory Visit | Attending: Pulmonary Disease | Admitting: Pulmonary Disease

## 2019-12-13 VITALS — BP 110/60 | HR 86 | Ht 71.0 in | Wt 289.9 lb

## 2019-12-13 DIAGNOSIS — Z79899 Other long term (current) drug therapy: Secondary | ICD-10-CM | POA: Insufficient documentation

## 2019-12-13 DIAGNOSIS — F419 Anxiety disorder, unspecified: Secondary | ICD-10-CM | POA: Insufficient documentation

## 2019-12-13 DIAGNOSIS — E039 Hypothyroidism, unspecified: Secondary | ICD-10-CM | POA: Insufficient documentation

## 2019-12-13 DIAGNOSIS — I10 Essential (primary) hypertension: Secondary | ICD-10-CM | POA: Diagnosis not present

## 2019-12-13 DIAGNOSIS — Z7901 Long term (current) use of anticoagulants: Secondary | ICD-10-CM | POA: Diagnosis not present

## 2019-12-13 DIAGNOSIS — J849 Interstitial pulmonary disease, unspecified: Secondary | ICD-10-CM | POA: Diagnosis not present

## 2019-12-13 DIAGNOSIS — Z79890 Hormone replacement therapy (postmenopausal): Secondary | ICD-10-CM | POA: Insufficient documentation

## 2019-12-13 DIAGNOSIS — Z8546 Personal history of malignant neoplasm of prostate: Secondary | ICD-10-CM | POA: Diagnosis not present

## 2019-12-13 DIAGNOSIS — Z7984 Long term (current) use of oral hypoglycemic drugs: Secondary | ICD-10-CM | POA: Diagnosis not present

## 2019-12-13 DIAGNOSIS — G4733 Obstructive sleep apnea (adult) (pediatric): Secondary | ICD-10-CM | POA: Insufficient documentation

## 2019-12-13 DIAGNOSIS — Z85528 Personal history of other malignant neoplasm of kidney: Secondary | ICD-10-CM | POA: Diagnosis not present

## 2019-12-13 DIAGNOSIS — Z7951 Long term (current) use of inhaled steroids: Secondary | ICD-10-CM | POA: Insufficient documentation

## 2019-12-13 DIAGNOSIS — I2510 Atherosclerotic heart disease of native coronary artery without angina pectoris: Secondary | ICD-10-CM | POA: Insufficient documentation

## 2019-12-13 DIAGNOSIS — I4891 Unspecified atrial fibrillation: Secondary | ICD-10-CM | POA: Diagnosis not present

## 2019-12-13 NOTE — Progress Notes (Signed)
Wayne Booth 79 y.o. male Pulmonary Rehab Orientation Note Patient arrived today in Cardiac and Pulmonary Rehab for orientation to Pulmonary Rehab. He was dropped of at the [facility] entrance by his wife.  He was very short of breath just walking 100 ft and oxygen saturations were 84% on RA.  He did not bring his portable oxygen concentrator, and is non-compliant with its use.  He states after exertion he will wear oxygen for a few minutes and was unsure of his prescribed liter flow. Color good, skin warm and dry. Patient is oriented to time and place. Patient's medical history, psychosocial health, and medications reviewed. Psychosocial assessment reveals pt lives with their spouse. Pt is currently retired. Pt hobbies include fishing. Pt reports his stress level is low. Pt does not exhibit signs of depression. PHQ2/9 score 0/0. Pt shows fair  coping skills with positive outlook . Will continue to monitor and evaluate progress toward psychosocial goal(s) of continued mental well-being while participating in pulmonary rehab. Physical assessment reveals heart rate is normal, breath sounds clear to auscultation, no wheezes, rales, or rhonchi. Grip strength equal, strong. Patient reports he does take medications as prescribed. Patient states he follows a Regular diet. He says he is pre-diabetic, A1C 6.3, was started on metformin in 07/2019 and was unaware this medication is for diabetes.  Our diabetes specialist will meet with [name] several times while he is in the program to educate him on diabetes and how to manage it.  Patient's weight will be monitored closely. Demonstration and practice of PLB using pulse oximeter. Patient able to return demonstration satisfactorily. Safety and hand hygiene in the exercise area reviewed with patient. Patient voices understanding of the information reviewed. Department expectations discussed with patient and achievable goals were set. The patient shows enthusiasm about attending the  program and we look forward to working with this nice gentleman. The patient completed a 6 min walk test on today, 12/13/2019 and to begin exercise on Tues, 12/19/2019 in the 1315 exercise slot.   [id number]

## 2019-12-13 NOTE — Progress Notes (Signed)
Pulmonary Individual Treatment Plan  Patient Details  Name: Wayne Booth MRN: [id number] Date of Birth: [birth year] Referring Provider:     Pulmonary Rehab Walk Test from 12/13/2019 in [place]  Referring Provider Dr. Elsworth Soho      Initial Encounter Date:    Pulmonary Rehab Walk Test from 12/13/2019 in [place]  Date 12/13/19      Visit Diagnosis: Interstitial lung disease ([place])  Patient's Home Medications on Admission:   Current Outpatient Medications:  .  albuterol (VENTOLIN HFA) 108 (90 Base) MCG/ACT inhaler, Inhale 2 puffs into the lungs every 6 (six) hours as needed for wheezing or shortness of breath., Disp: 18 g, Rfl: 2 .  Budeson-Glycopyrrol-Formoterol (BREZTRI AEROSPHERE) 160-9-4.8 MCG/ACT AERO, Inhale 2 puffs into the lungs in the morning and at bedtime., Disp: 10.7 g, Rfl: 0 .  Budeson-Glycopyrrol-Formoterol (BREZTRI AEROSPHERE) 160-9-4.8 MCG/ACT AERO, Inhale 2 puffs into the lungs in the morning and at bedtime., Disp: 10.7 g, Rfl: 4 .  budesonide-formoterol (SYMBICORT) 160-4.5 MCG/ACT inhaler, Inhale 2 puffs into the lungs 2 (two) times daily., Disp: 1 Inhaler, Rfl: 5 .  busPIRone (BUSPAR) 5 MG tablet, TAKE ONE TABLET BY MOUTH TWICE A DAY AS NEEDED FOR ANXIETY, Disp: 90 tablet, Rfl: 2 .  DULoxetine (CYMBALTA) 30 MG capsule, Take 1 capsule (30 mg total) by mouth daily., Disp: 30 capsule, Rfl: 3 .  ELIQUIS 5 MG TABS tablet, TAKE ONE TABLET BY MOUTH TWICE A DAY, Disp: 60 tablet, Rfl: 10 .  escitalopram (LEXAPRO) 20 MG tablet, Take 1 tablet (20 mg total) by mouth daily., Disp: 90 tablet, Rfl: 1 .  furosemide (LASIX) 40 MG tablet, TAKE 1-2 TABELTS BY MOUTH TWO TIMES A DAY, Disp: 120 tablet, Rfl: 4 .  gabapentin (NEURONTIN) 100 MG capsule, Take 3 capsules (300 mg total) by mouth at bedtime., Disp: 90 capsule, Rfl: 5 .  KLOR-CON M20 20 MEQ tablet, TAKE ONE TABLET BY MOUTH TWICE A DAY, Disp: 60 tablet, Rfl: 2 .   levothyroxine (SYNTHROID) 150 MCG tablet, TAKE ONE TABLET BY MOUTH DAILY, Disp: 90 tablet, Rfl: 0 .  metFORMIN (GLUCOPHAGE) 500 MG tablet, Take 1 tablet (500 mg total) by mouth daily with breakfast., Disp: 90 tablet, Rfl: 3 .  montelukast (SINGULAIR) 10 MG tablet, Take 1 tablet (10 mg total) by mouth daily., Disp: 30 tablet, Rfl: 11 .  Multiple Vitamins-Minerals (MULTIVITAMINS THER. W/MINERALS) TABS, Take 1 tablet by mouth daily., Disp: , Rfl:  .  nortriptyline (PAMELOR) 50 MG capsule, Take 1 capsule (50 mg total) by mouth at bedtime., Disp: 90 capsule, Rfl: 1 .  predniSONE (DELTASONE) 10 MG tablet, 4 tabs for 2 days, then 3 tabs for 2 days, 2 tabs for 2 days, then 1 tab for 2 days, then stop, Disp: 20 tablet, Rfl: 0 .  pregabalin (LYRICA) 75 MG capsule, TAKE ONE CAPSULE BY MOUTH TWICE A DAY, Disp: 60 capsule, Rfl: 1 .  pregabalin (LYRICA) 75 MG capsule, Take 1 capsule (75 mg total) by mouth 2 (two) times daily., Disp: 60 capsule, Rfl: 1 .  propranolol (INDERAL) 10 MG tablet, TAKE ONE TABLET BY MOUTH TWICE A DAY, Disp: 120 tablet, Rfl: 0 .  Spacer/Aero-Holding Chambers (AEROCHAMBER MV) inhaler, Use as instructed, Disp: 1 each, Rfl: 0 .  traMADol (ULTRAM) 50 MG tablet, Take 1 tablet (50 mg total) by mouth every 12 (twelve) hours as needed for moderate pain., Disp: 30 tablet, Rfl: 1 .  traZODone (DESYREL) 50 MG tablet,  TAKE ONE-HALF TO 1 FULL TABLET AT BEDTIME AS NEEDED FOR SLEEP, Disp: 90 tablet, Rfl: 2  Past Medical History: Past Medical History:  Diagnosis Date  . Allergy    States his nose runs when he is around grease.   Marland Kitchen Anxiety   . Arthritis   . Atrial fibrillation ([place])   . Cancer ([place])   . Coronary artery disease   . History of total knee replacement   . Hypertension   . Hypothyroidism   . Kidney cancer, primary, with metastasis from kidney to other site [facility])   . Kidney stone   . OSA (obstructive sleep apnea)    pt does not wear cpap at night  . Pituitary cyst ([place])   .  Pneumonia    hx of  . Prostate cancer ([place])     Tobacco Use: Social History   Tobacco Use  Smoking Status Never Smoker  Smokeless Tobacco Never Used    Labs: Recent Review Flowsheet Data    Labs for ITP Cardiac and Pulmonary Rehab Latest Ref Rng & Units 02/22/2018 05/10/2018 07/14/2019 07/14/2019 07/31/2019   Cholestrol 0 - 200 mg/dL - - - - -   LDLCALC 0 - 99 mg/dL - - - - -   LDLDIRECT mg/dL - - - - -   HDL >39.00 mg/dL - - - - -   Trlycerides <150 mg/dL - - - - -   Hemoglobin A1c 4.6 - 6.5 % - 5.9 - - 6.3   PHART 7.35 - 7.45 7.441 - - 7.353 -   PCO2ART 32 - 48 mmHg 44.1 - - 56.1(H) -   HCO3 20.0 - 28.0 mmol/L 29.5(H) - 32.0(H) 31.2(H) -   TCO2 22 - 32 mmol/L 31 - 34(H) 33(H) -   ACIDBASEDEF 0.0 - 2.0 mmol/L - - - - -   O2SAT % 95.0 - 72.0 96.0 -      Capillary Blood Glucose: Lab Results  Component Value Date   GLUCAP 98 02/24/2018   GLUCAP 132 (H) 02/23/2018   GLUCAP 119 (H) 02/23/2018   GLUCAP 110 (H) 02/22/2018   GLUCAP 104 (H) 02/22/2018     Pulmonary Assessment Scores:  Pulmonary Assessment Scores    Row Name 12/13/19 1158         ADL UCSD   ADL Phase Entry       mMRC Score   mMRC Score 3           UCSD: Self-administered rating of dyspnea associated with activities of daily living (ADLs) 6-point scale (0 = "not at all" to 5 = "maximal or unable to do because of breathlessness")  Scoring Scores range from 0 to 120.  Minimally important difference is 5 units  CAT: CAT can identify the health impairment of COPD patients and is better correlated with disease progression.  CAT has a scoring range of zero to 40. The CAT score is classified into four groups of low (less than 10), medium (10 - 20), high (21-30) and very high (31-40) based on the impact level of disease on health status. A CAT score over 10 suggests significant symptoms.  A worsening CAT score could be explained by an exacerbation, poor medication adherence, poor inhaler technique, or  progression of COPD or comorbid conditions.  CAT MCID is 2 points  mMRC: mMRC (Modified Medical Research Council) Dyspnea Scale is used to assess the degree of baseline functional disability in patients of respiratory disease due to dyspnea. No minimal important difference is established.  A decrease in score of 1 point or greater is considered a positive change.   Pulmonary Function Assessment:  Pulmonary Function Assessment - 12/13/19 0941      Breath   Bilateral Breath Sounds Clear;Decreased    Shortness of Breath Yes;Fear of Shortness of Breath;Limiting activity;Panic with Shortness of Breath           Exercise Target Goals: Exercise Program Goal: Individual exercise prescription set using results from initial 6 min walk test and THRR while considering  patient's activity barriers and safety.   Exercise Prescription Goal: Initial exercise prescription builds to 30-45 minutes a day of aerobic activity, 2-3 days per week.  Home exercise guidelines will be given to patient during program as part of exercise prescription that the participant will acknowledge.  Activity Barriers & Risk Stratification:  Activity Barriers & Cardiac Risk Stratification - 12/13/19 0935      Activity Barriers & Cardiac Risk Stratification   Activity Barriers Left Knee Replacement;Right Knee Replacement;Deconditioning;Muscular Weakness;Shortness of Breath           6 Minute Walk:  6 Minute Walk    Row Name 12/13/19 1158         6 Minute Walk   Phase Initial     Distance 400 feet     Walk Time 6 minutes     # of Rest Breaks 0     MPH 0.75     METS 0.28     RPE 15     Perceived Dyspnea  3     VO2 Peak 1     Symptoms Yes (comment)     Comments Chest tightness with exertion and resolved with rest. Took seated breaks due to SOB.     Resting HR 90 bpm     Resting BP 110/60     Resting Oxygen Saturation  94 %     Exercise Oxygen Saturation  during 6 min walk 86 %     Max Ex. HR 100 bpm      Max Ex. BP 146/80     2 Minute Post BP 122/70       Interval HR   1 Minute HR 90     2 Minute HR 98     3 Minute HR 93     4 Minute HR 91     5 Minute HR 97     6 Minute HR 100     2 Minute Post HR 86     Interval Heart Rate? Yes       Interval Oxygen   Interval Oxygen? Yes     Baseline Oxygen Saturation % 94 %     1 Minute Oxygen Saturation % 86 %     1 Minute Liters of Oxygen 0 L     2 Minute Oxygen Saturation % 88 %     2 Minute Liters of Oxygen 2 L     3 Minute Oxygen Saturation % 93 %     3 Minute Liters of Oxygen 2 L     4 Minute Oxygen Saturation % 97 %     4 Minute Liters of Oxygen 2 L     5 Minute Oxygen Saturation % 95 %     5 Minute Liters of Oxygen 2 L     6 Minute Oxygen Saturation % 89 %     6 Minute Liters of Oxygen 2 L     2 Minute Post Oxygen Saturation % 97 %  2 Minute Post Liters of Oxygen 2 L            Oxygen Initial Assessment:  Oxygen Initial Assessment - 12/13/19 1157      Home Oxygen   Home Oxygen Device Home Concentrator;Portable Concentrator    Sleep Oxygen Prescription Continuous    Home Exercise Oxygen Prescription Pulsed    Liters per minute 2    Home at Rest Exercise Oxygen Prescription None    Compliance with Home Oxygen Use No      Initial 6 min Walk   Oxygen Used Continuous    Liters per minute 2      Program Oxygen Prescription   Program Oxygen Prescription Continuous    Liters per minute 2      Intervention   Short Term Goals To learn and exhibit compliance with exercise, home and travel O2 prescription;To learn and understand importance of monitoring SPO2 with pulse oximeter and demonstrate accurate use of the pulse oximeter.;To learn and understand importance of maintaining oxygen saturations>88%;To learn and demonstrate proper pursed lip breathing techniques or other breathing techniques.;To learn and demonstrate proper use of respiratory medications    Long  Term Goals Exhibits compliance with exercise, home and  travel O2 prescription;Verbalizes importance of monitoring SPO2 with pulse oximeter and return demonstration;Maintenance of O2 saturations>88%;Exhibits proper breathing techniques, such as pursed lip breathing or other method taught during program session;Compliance with respiratory medication;Demonstrates proper use of MDI's           Oxygen Re-Evaluation:   Oxygen Discharge (Final Oxygen Re-Evaluation):   Initial Exercise Prescription:  Initial Exercise Prescription - 12/13/19 1200      Date of Initial Exercise RX and Referring Provider   Date 12/13/19    Referring Provider Dr. Elsworth Soho      Oxygen   Oxygen Continuous    Liters 2      T5 Nustep   Level 2    SPM 80    Minutes 30      Prescription Details   Frequency (times per week) 2    Duration Progress to 30 minutes of continuous aerobic without signs/symptoms of physical distress      Intensity   THRR 40-80% of Max Heartrate 57-114    Ratings of Perceived Exertion 11-13    Perceived Dyspnea 0-4      Progression   Progression Continue progressive overload as per policy without signs/symptoms or physical distress.      Resistance Training   Training Prescription Yes    Weight blue bands    Reps 10-15           Perform Capillary Blood Glucose checks as needed.  Exercise Prescription Changes:   Exercise Comments:   Exercise Goals and Review:  Exercise Goals    Row Name 12/13/19 1208             Exercise Goals   Increase Physical Activity Yes       Intervention Provide advice, education, support and counseling about physical activity/exercise needs.;Develop an individualized exercise prescription for aerobic and resistive training based on initial evaluation findings, risk stratification, comorbidities and participant's personal goals.       Expected Outcomes Short Term: Attend rehab on a regular basis to increase amount of physical activity.;Long Term: Add in home exercise to make exercise part of  routine and to increase amount of physical activity.;Long Term: Exercising regularly at least 3-5 days a week.       Increase Strength and Stamina Yes  Intervention Provide advice, education, support and counseling about physical activity/exercise needs.;Develop an individualized exercise prescription for aerobic and resistive training based on initial evaluation findings, risk stratification, comorbidities and participant's personal goals.       Expected Outcomes Short Term: Increase workloads from initial exercise prescription for resistance, speed, and METs.;Short Term: Perform resistance training exercises routinely during rehab and add in resistance training at home;Long Term: Improve cardiorespiratory fitness, muscular endurance and strength as measured by increased METs and functional capacity (6MWT)       Able to understand and use rate of perceived exertion (RPE) scale Yes       Intervention Provide education and explanation on how to use RPE scale       Expected Outcomes Short Term: Able to use RPE daily in rehab to express subjective intensity level;Long Term:  Able to use RPE to guide intensity level when exercising independently       Able to understand and use Dyspnea scale Yes       Intervention Provide education and explanation on how to use Dyspnea scale       Expected Outcomes Short Term: Able to use Dyspnea scale daily in rehab to express subjective sense of shortness of breath during exertion;Long Term: Able to use Dyspnea scale to guide intensity level when exercising independently       Knowledge and understanding of Target Heart Rate Range (THRR) Yes       Intervention Provide education and explanation of THRR including how the numbers were predicted and where they are located for reference       Expected Outcomes Short Term: Able to state/look up THRR;Short Term: Able to use daily as guideline for intensity in rehab;Long Term: Able to use THRR to govern intensity when  exercising independently       Understanding of Exercise Prescription Yes       Intervention Provide education, explanation, and written materials on patient's individual exercise prescription       Expected Outcomes Short Term: Able to explain program exercise prescription;Long Term: Able to explain home exercise prescription to exercise independently              Exercise Goals Re-Evaluation :   Discharge Exercise Prescription (Final Exercise Prescription Changes):   Nutrition:  Target Goals: Understanding of nutrition guidelines, daily intake of sodium 1500mg , cholesterol 200mg , calories 30% from fat and 7% or less from saturated fats, daily to have 5 or more servings of fruits and vegetables.  Biometrics:  Pre Biometrics - 12/13/19 0936      Pre Biometrics   Height 5\' 11"  (1.803 m)    Weight 131.5 kg    BMI (Calculated) 40.45    Grip Strength 32 kg            Nutrition Therapy Plan and Nutrition Goals:   Nutrition Assessments:   Nutrition Goals Re-Evaluation:   Nutrition Goals Discharge (Final Nutrition Goals Re-Evaluation):   Psychosocial: Target Goals: Acknowledge presence or absence of significant depression and/or stress, maximize coping skills, provide positive support system. Participant is able to verbalize types and ability to use techniques and skills needed for reducing stress and depression.  Initial Review & Psychosocial Screening:  Initial Psych Review & Screening - 12/13/19 1008      Initial Review   Current issues with None Identified      Family Dynamics   Good Support System? Yes      Barriers   Psychosocial barriers to participate in program There  are no identifiable barriers or psychosocial needs.      Screening Interventions   Interventions Encouraged to exercise           Quality of Life Scores:  Scores of 19 and below usually indicate a poorer quality of life in these areas.  A difference of  2-3 points is a clinically  meaningful difference.  A difference of 2-3 points in the total score of the Quality of Life Index has been associated with significant improvement in overall quality of life, self-image, physical symptoms, and general health in studies assessing change in quality of life.  PHQ-9: Recent Review Flowsheet Data    Depression screen [facility] 2/9 12/13/2019 07/31/2019 06/21/2019 05/05/2019 04/14/2019   Decreased Interest 0 0 0 3 0   Down, Depressed, Hopeless 0 0 0 0 0   PHQ - 2 Score 0 0 0 3 0   Altered sleeping 0 0 0 3 -   Tired, decreased energy 0 0 1 3 -   Change in appetite 0 0 0 3 -   Feeling bad or failure about yourself  0 0 0 0 -   Trouble concentrating 0 0 0 1 -   Moving slowly or fidgety/restless 0 0 0 0 -   Suicidal thoughts 0 0 0 - -   PHQ-9 Score 0 0 1 13 -   Difficult doing work/chores Not difficult at all Not difficult at all Not difficult at all - -     Interpretation of Total Score  Total Score Depression Severity:  1-4 = Minimal depression, 5-9 = Mild depression, 10-14 = Moderate depression, 15-19 = Moderately severe depression, 20-27 = Severe depression   Psychosocial Evaluation and Intervention:  Psychosocial Evaluation - 12/13/19 1009      Psychosocial Evaluation & Interventions   Interventions Encouraged to exercise with the program and follow exercise prescription    Comments No concerns identified at this time.    Continue Psychosocial Services  No Follow up required           Psychosocial Re-Evaluation:   Psychosocial Discharge (Final Psychosocial Re-Evaluation):   Education: Education Goals: Education classes will be provided on a weekly basis, covering required topics. Participant will state understanding/return demonstration of topics presented.  Learning Barriers/Preferences:  Learning Barriers/Preferences - 12/13/19 1213      Learning Barriers/Preferences   Learning Barriers None    Learning Preferences Audio;Computer/Internet;Group  Instruction;Individual Instruction;Pictoral;Skilled Demonstration;Verbal Instruction;Video;Written Material           Education Topics: Risk Factor Reduction:  -Group instruction that is supported by a PowerPoint presentation. Instructor discusses the definition of a risk factor, different risk factors for pulmonary disease, and how the heart and lungs work together.     Nutrition for Pulmonary Patient:  -Group instruction provided by PowerPoint slides, verbal discussion, and written materials to support subject matter. The instructor gives an explanation and review of healthy diet recommendations, which includes a discussion on weight management, recommendations for fruit and vegetable consumption, as well as protein, fluid, caffeine, fiber, sodium, sugar, and alcohol. Tips for eating when patients are short of breath are discussed.   Pursed Lip Breathing:  -Group instruction that is supported by demonstration and informational handouts. Instructor discusses the benefits of pursed lip and diaphragmatic breathing and detailed demonstration on how to preform both.     Oxygen Safety:  -Group instruction provided by PowerPoint, verbal discussion, and written material to support subject matter. There is an overview of "What is Oxygen" and "  Why do we need it".  Instructor also reviews how to create a safe environment for oxygen use, the importance of using oxygen as prescribed, and the risks of noncompliance. There is a brief discussion on traveling with oxygen and resources the patient may utilize.   Oxygen Equipment:  -Group instruction provided by [facility] Staff utilizing handouts, written materials, and equipment demonstrations.   Signs and Symptoms:  -Group instruction provided by written material and verbal discussion to support subject matter. Warning signs and symptoms of infection, stroke, and heart attack are reviewed and when to call the physician/911 reinforced. Tips for  preventing the spread of infection discussed.   Advanced Directives:  -Group instruction provided by verbal instruction and written material to support subject matter. Instructor reviews Advanced Directive laws and proper instruction for filling out document.   Pulmonary Video:  -Group video education that reviews the importance of medication and oxygen compliance, exercise, good nutrition, pulmonary hygiene, and pursed lip and diaphragmatic breathing for the pulmonary patient.   Exercise for the Pulmonary Patient:  -Group instruction that is supported by a PowerPoint presentation. Instructor discusses benefits of exercise, core components of exercise, frequency, duration, and intensity of an exercise routine, importance of utilizing pulse oximetry during exercise, safety while exercising, and options of places to exercise outside of rehab.     Pulmonary Medications:  -Verbally interactive group education provided by instructor with focus on inhaled medications and proper administration.   Anatomy and Physiology of the Respiratory System and Intimacy:  -Group instruction provided by PowerPoint, verbal discussion, and written material to support subject matter. Instructor reviews respiratory cycle and anatomical components of the respiratory system and their functions. Instructor also reviews differences in obstructive and restrictive respiratory diseases with examples of each. Intimacy, Sex, and Sexuality differences are reviewed with a discussion on how relationships can change when diagnosed with pulmonary disease. Common sexual concerns are reviewed.   MD DAY -A group question and answer session with a medical doctor that allows participants to ask questions that relate to their pulmonary disease state.   OTHER EDUCATION -Group or individual verbal, written, or video instructions that support the educational goals of the pulmonary rehab program.   Holiday Eating Survival Tips:   -Group instruction provided by PowerPoint slides, verbal discussion, and written materials to support subject matter. The instructor gives patients tips, tricks, and techniques to help them not only survive but enjoy the holidays despite the onslaught of food that accompanies the holidays.   Knowledge Questionnaire Score:   Core Components/Risk Factors/Patient Goals at Admission:  Personal Goals and Risk Factors at Admission - 12/13/19 1010      Core Components/Risk Factors/Patient Goals on Admission   Improve shortness of breath with ADL's Yes    Intervention Provide education, individualized exercise plan and daily activity instruction to help decrease symptoms of SOB with activities of daily living.    Expected Outcomes Short Term: Improve cardiorespiratory fitness to achieve a reduction of symptoms when performing ADLs;Long Term: Be able to perform more ADLs without symptoms or delay the onset of symptoms           Core Components/Risk Factors/Patient Goals Review:   Goals and Risk Factor Review    Row Name 12/13/19 1023             Core Components/Risk Factors/Patient Goals Review   Personal Goals Review Increase knowledge of respiratory medications and ability to use respiratory devices properly.;Improve shortness of breath with ADL's;Develop more efficient breathing techniques  such as purse lipped breathing and diaphragmatic breathing and practicing self-pacing with activity.              Core Components/Risk Factors/Patient Goals at Discharge (Final Review):   Goals and Risk Factor Review - 12/13/19 1023      Core Components/Risk Factors/Patient Goals Review   Personal Goals Review Increase knowledge of respiratory medications and ability to use respiratory devices properly.;Improve shortness of breath with ADL's;Develop more efficient breathing techniques such as purse lipped breathing and diaphragmatic breathing and practicing self-pacing with activity.            ITP Comments:   Comments:

## 2019-12-19 ENCOUNTER — Encounter (HOSPITAL_COMMUNITY)
Admission: RE | Admit: 2019-12-19 | Discharge: 2019-12-19 | Disposition: A | Discharge status: Home / Self Care | Payer: Medicare Other | Source: Ambulatory Visit | Attending: Pulmonary Disease | Admitting: Pulmonary Disease

## 2019-12-19 ENCOUNTER — Other Ambulatory Visit: Payer: Self-pay

## 2019-12-19 DIAGNOSIS — Z85528 Personal history of other malignant neoplasm of kidney: Secondary | ICD-10-CM | POA: Diagnosis not present

## 2019-12-19 DIAGNOSIS — G4733 Obstructive sleep apnea (adult) (pediatric): Secondary | ICD-10-CM | POA: Diagnosis not present

## 2019-12-19 DIAGNOSIS — J849 Interstitial pulmonary disease, unspecified: Secondary | ICD-10-CM | POA: Diagnosis not present

## 2019-12-19 DIAGNOSIS — Z7951 Long term (current) use of inhaled steroids: Secondary | ICD-10-CM | POA: Diagnosis not present

## 2019-12-19 DIAGNOSIS — Z7901 Long term (current) use of anticoagulants: Secondary | ICD-10-CM | POA: Diagnosis not present

## 2019-12-19 DIAGNOSIS — Z79899 Other long term (current) drug therapy: Secondary | ICD-10-CM | POA: Diagnosis not present

## 2019-12-19 DIAGNOSIS — I10 Essential (primary) hypertension: Secondary | ICD-10-CM | POA: Diagnosis not present

## 2019-12-19 DIAGNOSIS — Z7984 Long term (current) use of oral hypoglycemic drugs: Secondary | ICD-10-CM | POA: Diagnosis not present

## 2019-12-19 DIAGNOSIS — E039 Hypothyroidism, unspecified: Secondary | ICD-10-CM | POA: Diagnosis not present

## 2019-12-19 DIAGNOSIS — I2510 Atherosclerotic heart disease of native coronary artery without angina pectoris: Secondary | ICD-10-CM | POA: Diagnosis not present

## 2019-12-19 DIAGNOSIS — I4891 Unspecified atrial fibrillation: Secondary | ICD-10-CM | POA: Diagnosis not present

## 2019-12-19 NOTE — Progress Notes (Signed)
Nutrition Note  Pt arrived for exercise for pulmonary rehab. He has diagnosis of pre diabetes prescribed 500 mg metformin daily with breakfast. His CBG was 73 mg/dl. He did not have lunch but ate a sausage biscuit for breakfast. He does not have a glucometer nor has he ever checked CBGs. He has never experienced any hypoglycemia nor is he at risk. We did provide peanut butter crackers and gingerale since he had not had lunch and does not typically exercise. He had 38 g carbs for a "snack". His CBG increased to 77 mg/dl. We did allow exercise since his CBGs were rising and we monitored CBGs throughout. He ended exercise at 79 mg/dl. No symptoms. We sent him home with his wife driving. Provided crackers in case pt starts to feel poorly on ride home. We instructed pt to eat carb/protein lunch prior to rehab on Thursday. Provided pt and his wife with a glucometer to check fasting CBGs at home. Pt is amenable.   Will continue to monitor pt during his time in pulmonary rehab.   Michaele Offer, MS, RDN, LDN

## 2019-12-19 NOTE — Progress Notes (Signed)
Daily Session Note  Patient Details  Name: Wayne Booth MRN: [id number] Date of Birth: 1940-09-09 Referring Provider:     Pulmonary Rehab Walk Test from 12/13/2019 in [place]  Referring Provider Dr. Elsworth Soho      Encounter Date: 12/19/2019  Check In:  Session Check In - 12/19/19 1440      Check-In   Supervising physician immediately available to respond to emergencies [facility] immediately available    Physician(s) Dr. Zigmund Daniel    Location [facility]    Staff Present Rosebud Poles, RN, Bjorn Loser, MS, CEP, Exercise Physiologist;Maryjane Benedict Jani Gravel, MS, ACSM-CEP, Exercise Physiologist    Virtual Visit No    Medication changes reported     No    Fall or balance concerns reported    No    Tobacco Cessation No Change    Warm-up and Cool-down Performed on first and last piece of equipment    Resistance Training Performed Yes    VAD Patient? No    PAD/SET Patient? No      Pain Assessment   Currently in Pain? No/denies    Multiple Pain Sites No           Capillary Blood Glucose: No results found for this or any previous visit (from the past 24 hour(s)).    Social History   Tobacco Use  Smoking Status Never Smoker  Smokeless Tobacco Never Used    Goals Met:  Exercise tolerated well No report of cardiac concerns or symptoms Strength training completed today  Goals Unmet:  Not Applicable  Comments: Service time is from 1310 to 1430    Dr. Fransico Him is Medical Director for Cardiac Rehab at [facility].

## 2019-12-21 ENCOUNTER — Encounter (HOSPITAL_COMMUNITY)
Admission: RE | Admit: 2019-12-21 | Discharge: 2019-12-21 | Disposition: A | Discharge status: Home / Self Care | Payer: Medicare Other | Source: Ambulatory Visit | Attending: Pulmonary Disease | Admitting: Pulmonary Disease

## 2019-12-21 ENCOUNTER — Other Ambulatory Visit: Payer: Self-pay

## 2019-12-21 DIAGNOSIS — Z7984 Long term (current) use of oral hypoglycemic drugs: Secondary | ICD-10-CM | POA: Diagnosis not present

## 2019-12-21 DIAGNOSIS — J849 Interstitial pulmonary disease, unspecified: Secondary | ICD-10-CM | POA: Diagnosis not present

## 2019-12-21 DIAGNOSIS — Z85528 Personal history of other malignant neoplasm of kidney: Secondary | ICD-10-CM | POA: Diagnosis not present

## 2019-12-21 DIAGNOSIS — I10 Essential (primary) hypertension: Secondary | ICD-10-CM | POA: Diagnosis not present

## 2019-12-21 DIAGNOSIS — I2510 Atherosclerotic heart disease of native coronary artery without angina pectoris: Secondary | ICD-10-CM | POA: Diagnosis not present

## 2019-12-21 DIAGNOSIS — Z79899 Other long term (current) drug therapy: Secondary | ICD-10-CM | POA: Diagnosis not present

## 2019-12-21 DIAGNOSIS — I4891 Unspecified atrial fibrillation: Secondary | ICD-10-CM | POA: Diagnosis not present

## 2019-12-21 DIAGNOSIS — G4733 Obstructive sleep apnea (adult) (pediatric): Secondary | ICD-10-CM | POA: Diagnosis not present

## 2019-12-21 DIAGNOSIS — Z7951 Long term (current) use of inhaled steroids: Secondary | ICD-10-CM | POA: Diagnosis not present

## 2019-12-21 DIAGNOSIS — E039 Hypothyroidism, unspecified: Secondary | ICD-10-CM | POA: Diagnosis not present

## 2019-12-21 DIAGNOSIS — Z7901 Long term (current) use of anticoagulants: Secondary | ICD-10-CM | POA: Diagnosis not present

## 2019-12-21 LAB — GLUCOSE, CAPILLARY
Glucose-Capillary: 108 mg/dL — ABNORMAL HIGH (ref 70–99)
Glucose-Capillary: 116 mg/dL — ABNORMAL HIGH (ref 70–99)

## 2019-12-21 NOTE — Progress Notes (Signed)
Daily Session Note  Patient Details  Name: Wayne Booth MRN: [id number] Date of Birth: [birth year] Referring Provider:     Pulmonary Rehab Walk Test from 12/13/2019 in [place]  Referring Provider Dr. Elsworth Soho      Encounter Date: 12/21/2019  Check In:  Session Check In - 12/21/19 1303      Check-In   Supervising physician immediately available to respond to emergencies [facility] immediately available    Physician(s) Dr. Rodena Piety    Location [facility]    Staff Present Rosebud Poles, RN, Bjorn Loser, MS, CEP, Exercise Physiologist;Lisa Jani Gravel, MS, ACSM-CEP, Exercise Physiologist    Virtual Visit No    Medication changes reported     No    Fall or balance concerns reported    No    Tobacco Cessation No Change    Warm-up and Cool-down Performed on first and last piece of equipment    Resistance Training Performed Yes    VAD Patient? No    PAD/SET Patient? No      Pain Assessment   Currently in Pain? No/denies    Multiple Pain Sites No           Capillary Blood Glucose: Results for orders placed or performed during the hospital encounter of 12/21/19 (from the past 24 hour(s))  Glucose, capillary     Status: Abnormal   Collection Time: 12/21/19  1:13 PM  Result Value Ref Range   Glucose-Capillary 108 (H) 70 - 99 mg/dL  Glucose, capillary     Status: Abnormal   Collection Time: 12/21/19  2:03 PM  Result Value Ref Range   Glucose-Capillary 116 (H) 70 - 99 mg/dL      Social History   Tobacco Use  Smoking Status Never Smoker  Smokeless Tobacco Never Used    Goals Met:  Proper associated with RPD/PD & O2 Sat Exercise tolerated well Strength training completed today  Goals Unmet:  Not Applicable  Comments: Service time is from 1305 to 1412   Dr. Fransico Him is Medical Director for Cardiac Rehab at [facility].

## 2019-12-26 ENCOUNTER — Encounter (HOSPITAL_COMMUNITY)
Admission: RE | Admit: 2019-12-26 | Discharge: 2019-12-26 | Disposition: A | Discharge status: Home / Self Care | Payer: Medicare Other | Source: Ambulatory Visit | Attending: Pulmonary Disease | Admitting: Pulmonary Disease

## 2019-12-26 ENCOUNTER — Other Ambulatory Visit: Payer: Self-pay

## 2019-12-26 VITALS — Wt 290.8 lb

## 2019-12-26 DIAGNOSIS — I4891 Unspecified atrial fibrillation: Secondary | ICD-10-CM | POA: Diagnosis not present

## 2019-12-26 DIAGNOSIS — J849 Interstitial pulmonary disease, unspecified: Secondary | ICD-10-CM

## 2019-12-26 DIAGNOSIS — Z7951 Long term (current) use of inhaled steroids: Secondary | ICD-10-CM | POA: Diagnosis not present

## 2019-12-26 DIAGNOSIS — I2510 Atherosclerotic heart disease of native coronary artery without angina pectoris: Secondary | ICD-10-CM | POA: Diagnosis not present

## 2019-12-26 DIAGNOSIS — G4733 Obstructive sleep apnea (adult) (pediatric): Secondary | ICD-10-CM | POA: Diagnosis not present

## 2019-12-26 DIAGNOSIS — Z85528 Personal history of other malignant neoplasm of kidney: Secondary | ICD-10-CM | POA: Diagnosis not present

## 2019-12-26 DIAGNOSIS — E039 Hypothyroidism, unspecified: Secondary | ICD-10-CM | POA: Diagnosis not present

## 2019-12-26 DIAGNOSIS — Z7901 Long term (current) use of anticoagulants: Secondary | ICD-10-CM | POA: Diagnosis not present

## 2019-12-26 DIAGNOSIS — Z79899 Other long term (current) drug therapy: Secondary | ICD-10-CM | POA: Diagnosis not present

## 2019-12-26 DIAGNOSIS — I10 Essential (primary) hypertension: Secondary | ICD-10-CM | POA: Diagnosis not present

## 2019-12-26 DIAGNOSIS — Z7984 Long term (current) use of oral hypoglycemic drugs: Secondary | ICD-10-CM | POA: Diagnosis not present

## 2019-12-26 NOTE — Progress Notes (Signed)
Daily Session Note  Patient Details  Name: Wayne Booth MRN: [id number] Date of Birth: 1940-07-12 Referring Provider:     Pulmonary Rehab Walk Test from 12/13/2019 in [place]  Referring Provider Dr. Elsworth Soho      Encounter Date: 12/26/2019  Check In:  Session Check In - 12/26/19 1450      Check-In   Supervising physician immediately available to respond to emergencies [facility] immediately available    Physician(s) Dr. Barth Kirks    Location [facility]    Staff Present Rosebud Poles, RN, Bjorn Loser, MS, CEP, Exercise Physiologist;Lisa Jani Gravel, MS, ACSM-CEP, Exercise Physiologist    Virtual Visit No    Medication changes reported     No    Fall or balance concerns reported    No    Tobacco Cessation No Change    Warm-up and Cool-down Performed on first and last piece of equipment    Resistance Training Performed Yes    VAD Patient? No    PAD/SET Patient? No      Pain Assessment   Currently in Pain? No/denies    Multiple Pain Sites No           Capillary Blood Glucose: No results found for this or any previous visit (from the past 24 hour(s)).   Exercise Prescription Changes - 12/26/19 1400      Response to Exercise   Blood Pressure (Admit) 112/66    Blood Pressure (Exercise) 120/64    Blood Pressure (Exit) 120/70    Heart Rate (Admit) 88 bpm    Heart Rate (Exercise) 95 bpm    Heart Rate (Exit) 88 bpm    Oxygen Saturation (Admit) 95 %    Oxygen Saturation (Exercise) 95 %    Oxygen Saturation (Exit) 97 %    Rating of Perceived Exertion (Exercise) 12    Perceived Dyspnea (Exercise) 1    Duration Progress to 30 minutes of  aerobic without signs/symptoms of physical distress    Intensity THRR unchanged      Progression   Progression Continue to progress workloads to maintain intensity without signs/symptoms of physical distress.      Resistance Training   Training Prescription Yes     Weight blue bands    Reps 10-15    Time 10 Minutes      Oxygen   Oxygen Continuous    Liters 2      T5 Nustep   Level 1    SPM 80    Minutes 30           Social History   Tobacco Use  Smoking Status Never Smoker  Smokeless Tobacco Never Used    Goals Met:  Proper associated with RPD/PD & O2 Sat Exercise tolerated well Strength training completed today  Goals Unmet:  Not Applicable  Comments: Service time is from 1310 to 1407   Dr. Fransico Him is Medical Director for Cardiac Rehab at [facility].

## 2019-12-28 ENCOUNTER — Other Ambulatory Visit: Payer: Self-pay

## 2019-12-28 ENCOUNTER — Encounter (HOSPITAL_COMMUNITY)
Admission: RE | Admit: 2019-12-28 | Discharge: 2019-12-28 | Disposition: A | Discharge status: Home / Self Care | Payer: Medicare Other | Source: Ambulatory Visit | Attending: Pulmonary Disease | Admitting: Pulmonary Disease

## 2019-12-28 DIAGNOSIS — Z7951 Long term (current) use of inhaled steroids: Secondary | ICD-10-CM | POA: Diagnosis not present

## 2019-12-28 DIAGNOSIS — Z79899 Other long term (current) drug therapy: Secondary | ICD-10-CM | POA: Insufficient documentation

## 2019-12-28 DIAGNOSIS — Z7901 Long term (current) use of anticoagulants: Secondary | ICD-10-CM | POA: Insufficient documentation

## 2019-12-28 DIAGNOSIS — E039 Hypothyroidism, unspecified: Secondary | ICD-10-CM | POA: Insufficient documentation

## 2019-12-28 DIAGNOSIS — I2510 Atherosclerotic heart disease of native coronary artery without angina pectoris: Secondary | ICD-10-CM | POA: Diagnosis not present

## 2019-12-28 DIAGNOSIS — J849 Interstitial pulmonary disease, unspecified: Secondary | ICD-10-CM | POA: Diagnosis not present

## 2019-12-28 DIAGNOSIS — F419 Anxiety disorder, unspecified: Secondary | ICD-10-CM | POA: Diagnosis not present

## 2019-12-28 DIAGNOSIS — Z85528 Personal history of other malignant neoplasm of kidney: Secondary | ICD-10-CM | POA: Insufficient documentation

## 2019-12-28 DIAGNOSIS — I4891 Unspecified atrial fibrillation: Secondary | ICD-10-CM | POA: Insufficient documentation

## 2019-12-28 DIAGNOSIS — Z79890 Hormone replacement therapy (postmenopausal): Secondary | ICD-10-CM | POA: Insufficient documentation

## 2019-12-28 DIAGNOSIS — G4733 Obstructive sleep apnea (adult) (pediatric): Secondary | ICD-10-CM | POA: Insufficient documentation

## 2019-12-28 DIAGNOSIS — Z7984 Long term (current) use of oral hypoglycemic drugs: Secondary | ICD-10-CM | POA: Insufficient documentation

## 2019-12-28 DIAGNOSIS — I10 Essential (primary) hypertension: Secondary | ICD-10-CM | POA: Diagnosis not present

## 2019-12-28 DIAGNOSIS — Z8546 Personal history of malignant neoplasm of prostate: Secondary | ICD-10-CM | POA: Insufficient documentation

## 2019-12-28 NOTE — Progress Notes (Signed)
Daily Session Note  Patient Details  Name: Wayne Booth MRN: [id number] Date of Birth: 1940-12-03 Referring Provider:     Pulmonary Rehab Walk Test from 12/13/2019 in [place]  Referring Provider Dr. Elsworth Soho      Encounter Date: 12/28/2019  Check In:  Session Check In - 12/28/19 1310      Check-In   Supervising physician immediately available to respond to emergencies [facility] immediately available    Physician(s) Dr. Rodena Piety    Location [facility]    Staff Present Rosebud Poles, RN, Bjorn Loser, MS, CEP, Exercise Physiologist;Lisa Jani Gravel, MS, ACSM-CEP, Exercise Physiologist    Virtual Visit No    Medication changes reported     No    Fall or balance concerns reported    No    Tobacco Cessation No Change    Warm-up and Cool-down Performed on first and last piece of equipment    Resistance Training Performed Yes    VAD Patient? No    PAD/SET Patient? No      Pain Assessment   Currently in Pain? No/denies    Multiple Pain Sites No           Capillary Blood Glucose: No results found for this or any previous visit (from the past 24 hour(s)).    Social History   Tobacco Use  Smoking Status Never Smoker  Smokeless Tobacco Never Used    Goals Met:  Proper associated with RPD/PD & O2 Sat Exercise tolerated well Strength training completed today  Goals Unmet:  Not Applicable  Comments: Service time is from 2482 to 1410    Dr. Fransico Him is Medical Director for Cardiac Rehab at [facility].

## 2020-01-02 ENCOUNTER — Other Ambulatory Visit: Payer: Self-pay

## 2020-01-02 ENCOUNTER — Encounter (HOSPITAL_COMMUNITY)
Admission: RE | Admit: 2020-01-02 | Discharge: 2020-01-02 | Disposition: A | Discharge status: Home / Self Care | Payer: Medicare Other | Source: Ambulatory Visit | Attending: Pulmonary Disease | Admitting: Pulmonary Disease

## 2020-01-02 VITALS — Wt 293.7 lb

## 2020-01-02 DIAGNOSIS — J849 Interstitial pulmonary disease, unspecified: Secondary | ICD-10-CM

## 2020-01-02 DIAGNOSIS — I2510 Atherosclerotic heart disease of native coronary artery without angina pectoris: Secondary | ICD-10-CM | POA: Diagnosis not present

## 2020-01-02 DIAGNOSIS — Z7984 Long term (current) use of oral hypoglycemic drugs: Secondary | ICD-10-CM | POA: Diagnosis not present

## 2020-01-02 DIAGNOSIS — Z85528 Personal history of other malignant neoplasm of kidney: Secondary | ICD-10-CM | POA: Diagnosis not present

## 2020-01-02 DIAGNOSIS — Z7901 Long term (current) use of anticoagulants: Secondary | ICD-10-CM | POA: Diagnosis not present

## 2020-01-02 DIAGNOSIS — E039 Hypothyroidism, unspecified: Secondary | ICD-10-CM | POA: Diagnosis not present

## 2020-01-02 DIAGNOSIS — Z79899 Other long term (current) drug therapy: Secondary | ICD-10-CM | POA: Diagnosis not present

## 2020-01-02 DIAGNOSIS — I10 Essential (primary) hypertension: Secondary | ICD-10-CM | POA: Diagnosis not present

## 2020-01-02 DIAGNOSIS — G4733 Obstructive sleep apnea (adult) (pediatric): Secondary | ICD-10-CM | POA: Diagnosis not present

## 2020-01-02 DIAGNOSIS — Z7951 Long term (current) use of inhaled steroids: Secondary | ICD-10-CM | POA: Diagnosis not present

## 2020-01-02 DIAGNOSIS — I4891 Unspecified atrial fibrillation: Secondary | ICD-10-CM | POA: Diagnosis not present

## 2020-01-02 NOTE — Progress Notes (Signed)
Daily Session Note  Patient Details  Name: ROGUE RAFALSKI MRN: [id number] Date of Birth: 09-14-1940 Referring Provider:     Pulmonary Rehab Walk Test from 12/13/2019 in [place]  Referring Provider Dr. Elsworth Soho      Encounter Date: 01/02/2020  Check In:  Session Check In - 01/02/20 1334      Check-In   Supervising physician immediately available to respond to emergencies [facility] immediately available    Physician(s) Dr. Rodena Piety    Location [facility]    Staff Present Rosebud Poles, RN, BSN;Carlette Wilber Oliphant, RN, Roque Cash, RN    Virtual Visit No    Medication changes reported     No    Fall or balance concerns reported    No    Tobacco Cessation No Change    Warm-up and Cool-down Performed as group-led instruction    Resistance Training Performed Yes    VAD Patient? No    PAD/SET Patient? No      Pain Assessment   Currently in Pain? No/denies    Multiple Pain Sites No           Capillary Blood Glucose: No results found for this or any previous visit (from the past 24 hour(s)).    Social History   Tobacco Use  Smoking Status Never Smoker  Smokeless Tobacco Never Used    Goals Met:  Proper associated with RPD/PD & O2 Sat Exercise tolerated well Strength training completed today  Goals Unmet:  Not Applicable  Comments: Service time is from 1320 to [place]    Dr. Fransico Him is Medical Director for Cardiac Rehab at [facility].

## 2020-01-04 ENCOUNTER — Encounter (HOSPITAL_COMMUNITY): Payer: Medicare Other

## 2020-01-08 ENCOUNTER — Other Ambulatory Visit: Payer: Self-pay

## 2020-01-08 ENCOUNTER — Ambulatory Visit (INDEPENDENT_AMBULATORY_CARE_PROVIDER_SITE_OTHER)
Admission: RE | Admit: 2020-01-08 | Discharge: 2020-01-08 | Disposition: A | Discharge status: Home / Self Care | Payer: Medicare Other | Source: Ambulatory Visit | Attending: Adult Health | Admitting: Adult Health

## 2020-01-08 DIAGNOSIS — I700 Atherosclerosis of aorta: Secondary | ICD-10-CM | POA: Diagnosis not present

## 2020-01-08 DIAGNOSIS — R911 Solitary pulmonary nodule: Secondary | ICD-10-CM

## 2020-01-08 DIAGNOSIS — J90 Pleural effusion, not elsewhere classified: Secondary | ICD-10-CM | POA: Diagnosis not present

## 2020-01-08 DIAGNOSIS — J8410 Pulmonary fibrosis, unspecified: Secondary | ICD-10-CM | POA: Diagnosis not present

## 2020-01-08 DIAGNOSIS — I2510 Atherosclerotic heart disease of native coronary artery without angina pectoris: Secondary | ICD-10-CM | POA: Diagnosis not present

## 2020-01-09 ENCOUNTER — Telehealth (HOSPITAL_COMMUNITY): Payer: Self-pay | Admitting: *Deleted

## 2020-01-09 ENCOUNTER — Encounter (HOSPITAL_COMMUNITY)
Admission: RE | Admit: 2020-01-09 | Discharge: 2020-01-09 | Disposition: A | Discharge status: Home / Self Care | Payer: Medicare Other | Source: Ambulatory Visit | Attending: Pulmonary Disease | Admitting: Pulmonary Disease

## 2020-01-09 NOTE — Progress Notes (Signed)
Daily Session Note  Patient Details  Name: Wayne Booth MRN: [id number] Date of Birth: Oct 28, 1940 Referring Provider:     Pulmonary Rehab Walk Test from 12/13/2019 in [place]  Referring Provider Dr. Elsworth Soho      Encounter Date: 01/02/2020  Check In:   Capillary Blood Glucose: No results found for this or any previous visit (from the past 24 hour(s)).    Social History   Tobacco Use  Smoking Status Never Smoker  Smokeless Tobacco Never Used    Goals Met:  Proper associated with RPD/PD & O2 Sat Exercise tolerated well Strength training completed today  Goals Unmet:  Not Applicable  Comments: Service time is from 1320 to [place]    Dr. Fransico Him is Medical Director for Cardiac Rehab at [facility].

## 2020-01-11 ENCOUNTER — Encounter (HOSPITAL_COMMUNITY): Payer: Medicare Other

## 2020-01-11 ENCOUNTER — Telehealth (HOSPITAL_COMMUNITY): Payer: Self-pay | Admitting: Family Medicine

## 2020-01-11 NOTE — Progress Notes (Signed)
Pulmonary Individual Treatment Plan  Patient Details  Name: Wayne Booth MRN: [id number] Date of Birth: March 09, 1941 Referring Provider:     Pulmonary Rehab Walk Test from 12/13/2019 in [facility]  Referring Provider Dr. Elsworth Soho      Initial Encounter Date:    Pulmonary Rehab Walk Test from 12/13/2019 in [facility]  Date 12/13/19      Visit Diagnosis: Interstitial lung disease ([place])  Patient's Home Medications on Admission:   Current Outpatient Medications:    albuterol (VENTOLIN HFA) 108 (90 Base) MCG/ACT inhaler, Inhale 2 puffs into the lungs every 6 (six) hours as needed for wheezing or shortness of breath., Disp: 18 g, Rfl: 2   Budeson-Glycopyrrol-Formoterol (BREZTRI AEROSPHERE) 160-9-4.8 MCG/ACT AERO, Inhale 2 puffs into the lungs in the morning and at bedtime., Disp: 10.7 g, Rfl: 0   Budeson-Glycopyrrol-Formoterol (BREZTRI AEROSPHERE) 160-9-4.8 MCG/ACT AERO, Inhale 2 puffs into the lungs in the morning and at bedtime., Disp: 10.7 g, Rfl: 4   budesonide-formoterol (SYMBICORT) 160-4.5 MCG/ACT inhaler, Inhale 2 puffs into the lungs 2 (two) times daily., Disp: 1 Inhaler, Rfl: 5   busPIRone (BUSPAR) 5 MG tablet, TAKE ONE TABLET BY MOUTH TWICE A DAY AS NEEDED FOR ANXIETY, Disp: 90 tablet, Rfl: 2   DULoxetine (CYMBALTA) 30 MG capsule, Take 1 capsule (30 mg total) by mouth daily., Disp: 30 capsule, Rfl: 3   ELIQUIS 5 MG TABS tablet, TAKE ONE TABLET BY MOUTH TWICE A DAY, Disp: 60 tablet, Rfl: 10   escitalopram (LEXAPRO) 20 MG tablet, Take 1 tablet (20 mg total) by mouth daily., Disp: 90 tablet, Rfl: 1   furosemide (LASIX) 40 MG tablet, TAKE 1-2 TABELTS BY MOUTH TWO TIMES A DAY, Disp: 120 tablet, Rfl: 4   gabapentin (NEURONTIN) 100 MG capsule, Take 3 capsules (300 mg total) by mouth at bedtime., Disp: 90 capsule, Rfl: 5   KLOR-CON M20 20 MEQ tablet, TAKE ONE TABLET BY MOUTH TWICE A DAY, Disp: 60 tablet, Rfl: 2    levothyroxine (SYNTHROID) 150 MCG tablet, TAKE ONE TABLET BY MOUTH DAILY, Disp: 90 tablet, Rfl: 0   metFORMIN (GLUCOPHAGE) 500 MG tablet, Take 1 tablet (500 mg total) by mouth daily with breakfast., Disp: 90 tablet, Rfl: 3   montelukast (SINGULAIR) 10 MG tablet, Take 1 tablet (10 mg total) by mouth daily., Disp: 30 tablet, Rfl: 11   Multiple Vitamins-Minerals (MULTIVITAMINS THER. W/MINERALS) TABS, Take 1 tablet by mouth daily., Disp: , Rfl:    nortriptyline (PAMELOR) 50 MG capsule, Take 1 capsule (50 mg total) by mouth at bedtime., Disp: 90 capsule, Rfl: 1   predniSONE (DELTASONE) 10 MG tablet, 4 tabs for 2 days, then 3 tabs for 2 days, 2 tabs for 2 days, then 1 tab for 2 days, then stop, Disp: 20 tablet, Rfl: 0   pregabalin (LYRICA) 75 MG capsule, TAKE ONE CAPSULE BY MOUTH TWICE A DAY, Disp: 60 capsule, Rfl: 1   pregabalin (LYRICA) 75 MG capsule, Take 1 capsule (75 mg total) by mouth 2 (two) times daily., Disp: 60 capsule, Rfl: 1   propranolol (INDERAL) 10 MG tablet, TAKE ONE TABLET BY MOUTH TWICE A DAY, Disp: 120 tablet, Rfl: 0   Spacer/Aero-Holding Chambers (AEROCHAMBER MV) inhaler, Use as instructed, Disp: 1 each, Rfl: 0   traMADol (ULTRAM) 50 MG tablet, Take 1 tablet (50 mg total) by mouth every 12 (twelve) hours as needed for moderate pain., Disp: 30 tablet, Rfl: 1   traZODone (DESYREL) 50 MG tablet,  TAKE ONE-HALF TO 1 FULL TABLET AT BEDTIME AS NEEDED FOR SLEEP, Disp: 90 tablet, Rfl: 2  Past Medical History: Past Medical History:  Diagnosis Date   Allergy    States his nose runs when he is around grease.    Anxiety    Arthritis    Atrial fibrillation (HCC)    Cancer ([place])    Coronary artery disease    History of total knee replacement    Hypertension    Hypothyroidism    Kidney cancer, primary, with metastasis from kidney to other site [facility])    Kidney stone    OSA (obstructive sleep apnea)    pt does not wear cpap at night   Pituitary cyst ([place])     Pneumonia    hx of   Prostate cancer ([place])     Tobacco Use: Social History   Tobacco Use  Smoking Status Never Smoker  Smokeless Tobacco Never Used    Labs: Recent Review Flowsheet Data    Labs for ITP Cardiac and Pulmonary Rehab Latest Ref Rng & Units 02/22/2018 05/10/2018 07/14/2019 07/14/2019 07/31/2019   Cholestrol 0 - 200 mg/dL - - - - -   LDLCALC 0 - 99 mg/dL - - - - -   LDLDIRECT mg/dL - - - - -   HDL >39.00 mg/dL - - - - -   Trlycerides <150 mg/dL - - - - -   Hemoglobin A1c 4.6 - 6.5 % - 5.9 - - 6.3   PHART 7.35 - 7.45 7.441 - - 7.353 -   PCO2ART 32 - 48 mmHg 44.1 - - 56.1(H) -   HCO3 20.0 - 28.0 mmol/L 29.5(H) - 32.0(H) 31.2(H) -   TCO2 22 - 32 mmol/L 31 - 34(H) 33(H) -   ACIDBASEDEF 0.0 - 2.0 mmol/L - - - - -   O2SAT % 95.0 - 72.0 96.0 -      Capillary Blood Glucose: Lab Results  Component Value Date   GLUCAP 116 (H) 12/21/2019   GLUCAP 108 (H) 12/21/2019   GLUCAP 98 02/24/2018   GLUCAP 132 (H) 02/23/2018   GLUCAP 119 (H) 02/23/2018     Pulmonary Assessment Scores:  Pulmonary Assessment Scores    Row Name 12/13/19 1158 12/19/19 1501       ADL UCSD   ADL Phase Entry Entry    SOB Score total -- 102      CAT Score   CAT Score -- 28      mMRC Score   mMRC Score 3 --          UCSD: Self-administered rating of dyspnea associated with activities of daily living (ADLs) 6-point scale (0 = "not at all" to 5 = "maximal or unable to do because of breathlessness")  Scoring Scores range from 0 to 120.  Minimally important difference is 5 units  CAT: CAT can identify the health impairment of COPD patients and is better correlated with disease progression.  CAT has a scoring range of zero to 40. The CAT score is classified into four groups of low (less than 10), medium (10 - 20), high (21-30) and very high (31-40) based on the impact level of disease on health status. A CAT score over 10 suggests significant symptoms.  A worsening CAT score could be explained  by an exacerbation, poor medication adherence, poor inhaler technique, or progression of COPD or comorbid conditions.  CAT MCID is 2 points  mMRC: mMRC (Modified Medical Research Council) Dyspnea Scale is used to  assess the degree of baseline functional disability in patients of respiratory disease due to dyspnea. No minimal important difference is established. A decrease in score of 1 point or greater is considered a positive change.   Pulmonary Function Assessment:  Pulmonary Function Assessment - 12/13/19 0941      Breath   Bilateral Breath Sounds Clear;Decreased    Shortness of Breath Yes;Fear of Shortness of Breath;Limiting activity;Panic with Shortness of Breath           Exercise Target Goals: Exercise Program Goal: Individual exercise prescription set using results from initial 6 min walk test and THRR while considering  patients activity barriers and safety.   Exercise Prescription Goal: Initial exercise prescription builds to 30-45 minutes a day of aerobic activity, 2-3 days per week.  Home exercise guidelines will be given to patient during program as part of exercise prescription that the participant will acknowledge.  Activity Barriers & Risk Stratification:  Activity Barriers & Cardiac Risk Stratification - 12/13/19 0935      Activity Barriers & Cardiac Risk Stratification   Activity Barriers Left Knee Replacement;Right Knee Replacement;Deconditioning;Muscular Weakness;Shortness of Breath           6 Minute Walk:  6 Minute Walk    Row Name 12/13/19 1158         6 Minute Walk   Phase Initial     Distance 400 feet     Walk Time 6 minutes     # of Rest Breaks 0     MPH 0.75     METS 0.28     RPE 15     Perceived Dyspnea  3     VO2 Peak 1     Symptoms Yes (comment)     Comments Chest tightness with exertion and resolved with rest. Took seated breaks due to SOB.     Resting HR 90 bpm     Resting BP 110/60     Resting Oxygen Saturation  94 %      Exercise Oxygen Saturation  during 6 min walk 86 %     Max Ex. HR 100 bpm     Max Ex. BP 146/80     2 Minute Post BP 122/70       Interval HR   1 Minute HR 90     2 Minute HR 98     3 Minute HR 93     4 Minute HR 91     5 Minute HR 97     6 Minute HR 100     2 Minute Post HR 86     Interval Heart Rate? Yes       Interval Oxygen   Interval Oxygen? Yes     Baseline Oxygen Saturation % 94 %     1 Minute Oxygen Saturation % 86 %     1 Minute Liters of Oxygen 0 L     2 Minute Oxygen Saturation % 88 %     2 Minute Liters of Oxygen 2 L     3 Minute Oxygen Saturation % 93 %     3 Minute Liters of Oxygen 2 L     4 Minute Oxygen Saturation % 97 %     4 Minute Liters of Oxygen 2 L     5 Minute Oxygen Saturation % 95 %     5 Minute Liters of Oxygen 2 L     6 Minute Oxygen Saturation % 89 %  6 Minute Liters of Oxygen 2 L     2 Minute Post Oxygen Saturation % 97 %     2 Minute Post Liters of Oxygen 2 L            Oxygen Initial Assessment:  Oxygen Initial Assessment - 12/13/19 1157      Home Oxygen   Home Oxygen Device Home Concentrator;Portable Concentrator    Sleep Oxygen Prescription Continuous    Home Exercise Oxygen Prescription Pulsed    Liters per minute 2    Home at Rest Exercise Oxygen Prescription None    Compliance with Home Oxygen Use No      Initial 6 min Walk   Oxygen Used Continuous    Liters per minute 2      Program Oxygen Prescription   Program Oxygen Prescription Continuous    Liters per minute 2      Intervention   Short Term Goals To learn and exhibit compliance with exercise, home and travel O2 prescription;To learn and understand importance of monitoring SPO2 with pulse oximeter and demonstrate accurate use of the pulse oximeter.;To learn and understand importance of maintaining oxygen saturations>88%;To learn and demonstrate proper pursed lip breathing techniques or other breathing techniques.;To learn and demonstrate proper use of respiratory  medications    Long  Term Goals Exhibits compliance with exercise, home and travel O2 prescription;Verbalizes importance of monitoring SPO2 with pulse oximeter and return demonstration;Maintenance of O2 saturations>88%;Exhibits proper breathing techniques, such as pursed lip breathing or other method taught during program session;Compliance with respiratory medication;Demonstrates proper use of MDIs           Oxygen Re-Evaluation:  Oxygen Re-Evaluation    Row Name 01/09/20 0749             Program Oxygen Prescription   Program Oxygen Prescription Continuous       Liters per minute 2         Home Oxygen   Home Oxygen Device Home Concentrator;Portable Concentrator       Sleep Oxygen Prescription Continuous       Home Exercise Oxygen Prescription Pulsed       Liters per minute 2       Home at Rest Exercise Oxygen Prescription None       Compliance with Home Oxygen Use No  Pt is becoming more compliant but needs to be more consistent         Goals/Expected Outcomes   Short Term Goals To learn and exhibit compliance with exercise, home and travel O2 prescription;To learn and understand importance of monitoring SPO2 with pulse oximeter and demonstrate accurate use of the pulse oximeter.;To learn and understand importance of maintaining oxygen saturations>88%;To learn and demonstrate proper pursed lip breathing techniques or other breathing techniques.;To learn and demonstrate proper use of respiratory medications       Long  Term Goals Exhibits compliance with exercise, home and travel O2 prescription;Verbalizes importance of monitoring SPO2 with pulse oximeter and return demonstration;Maintenance of O2 saturations>88%;Exhibits proper breathing techniques, such as pursed lip breathing or other method taught during program session;Compliance with respiratory medication;Demonstrates proper use of MDIs       Goals/Expected Outcomes compliance              Oxygen Discharge (Final  Oxygen Re-Evaluation):  Oxygen Re-Evaluation - 01/09/20 0749      Program Oxygen Prescription   Program Oxygen Prescription Continuous    Liters per minute 2      Home Oxygen  Home Oxygen Device Home Concentrator;Portable Concentrator    Sleep Oxygen Prescription Continuous    Home Exercise Oxygen Prescription Pulsed    Liters per minute 2    Home at Rest Exercise Oxygen Prescription None    Compliance with Home Oxygen Use No   Pt is becoming more compliant but needs to be more consistent     Goals/Expected Outcomes   Short Term Goals To learn and exhibit compliance with exercise, home and travel O2 prescription;To learn and understand importance of monitoring SPO2 with pulse oximeter and demonstrate accurate use of the pulse oximeter.;To learn and understand importance of maintaining oxygen saturations>88%;To learn and demonstrate proper pursed lip breathing techniques or other breathing techniques.;To learn and demonstrate proper use of respiratory medications    Long  Term Goals Exhibits compliance with exercise, home and travel O2 prescription;Verbalizes importance of monitoring SPO2 with pulse oximeter and return demonstration;Maintenance of O2 saturations>88%;Exhibits proper breathing techniques, such as pursed lip breathing or other method taught during program session;Compliance with respiratory medication;Demonstrates proper use of MDIs    Goals/Expected Outcomes compliance           Initial Exercise Prescription:  Initial Exercise Prescription - 12/13/19 1200      Date of Initial Exercise RX and Referring Provider   Date 12/13/19    Referring Provider Dr. Elsworth Soho      Oxygen   Oxygen Continuous    Liters 2      T5 Nustep   Level 2    SPM 80    Minutes 30      Prescription Details   Frequency (times per week) 2    Duration Progress to 30 minutes of continuous aerobic without signs/symptoms of physical distress      Intensity   THRR 40-80% of Max Heartrate 57-114     Ratings of Perceived Exertion 11-13    Perceived Dyspnea 0-4      Progression   Progression Continue progressive overload as per policy without signs/symptoms or physical distress.      Resistance Training   Training Prescription Yes    Weight blue bands    Reps 10-15           Perform Capillary Blood Glucose checks as needed.  Exercise Prescription Changes:  Exercise Prescription Changes    Row Name 12/26/19 1400 01/02/20 1503           Response to Exercise   Blood Pressure (Admit) 112/66 122/60      Blood Pressure (Exercise) 120/64 --      Blood Pressure (Exit) 120/70 128/60      Heart Rate (Admit) 88 bpm 90 bpm      Heart Rate (Exercise) 95 bpm 103 bpm      Heart Rate (Exit) 88 bpm 91 bpm      Oxygen Saturation (Admit) 95 % 98 %      Oxygen Saturation (Exercise) 95 % 94 %      Oxygen Saturation (Exit) 97 % 97 %      Rating of Perceived Exertion (Exercise) 12 13      Perceived Dyspnea (Exercise) 1 2      Duration Progress to 30 minutes of  aerobic without signs/symptoms of physical distress Continue with 30 min of aerobic exercise without signs/symptoms of physical distress.      Intensity THRR unchanged THRR unchanged        Progression   Progression Continue to progress workloads to maintain intensity without signs/symptoms of physical distress. Continue  to progress workloads to maintain intensity without signs/symptoms of physical distress.        Resistance Training   Training Prescription Yes Yes      Weight blue bands blue bands      Reps 10-15 10-15      Time 10 Minutes 10 Minutes        Oxygen   Oxygen Continuous Continuous      Liters 2 2        T5 Nustep   Level 1 2      SPM 80 80      Minutes 30 30      METs -- 1.9             Exercise Comments:  Exercise Comments    Row Name 12/19/19 1441           Exercise Comments Pt completed his first day of exercise in pulmonary rehab. He is severely deconditioned and was short of breath  throughout the workout. He was able to tolerate the resistance bands with less shortness of breath than the stepper.              Exercise Goals and Review:  Exercise Goals    Row Name 12/13/19 1208 01/09/20 0751           Exercise Goals   Increase Physical Activity Yes Yes      Intervention Provide advice, education, support and counseling about physical activity/exercise needs.;Develop an individualized exercise prescription for aerobic and resistive training based on initial evaluation findings, risk stratification, comorbidities and participant's personal goals. Provide advice, education, support and counseling about physical activity/exercise needs.;Develop an individualized exercise prescription for aerobic and resistive training based on initial evaluation findings, risk stratification, comorbidities and participant's personal goals.      Expected Outcomes Short Term: Attend rehab on a regular basis to increase amount of physical activity.;Long Term: Add in home exercise to make exercise part of routine and to increase amount of physical activity.;Long Term: Exercising regularly at least 3-5 days a week. Short Term: Attend rehab on a regular basis to increase amount of physical activity.;Long Term: Add in home exercise to make exercise part of routine and to increase amount of physical activity.;Long Term: Exercising regularly at least 3-5 days a week.      Increase Strength and Stamina Yes Yes      Intervention Provide advice, education, support and counseling about physical activity/exercise needs.;Develop an individualized exercise prescription for aerobic and resistive training based on initial evaluation findings, risk stratification, comorbidities and participant's personal goals. Provide advice, education, support and counseling about physical activity/exercise needs.;Develop an individualized exercise prescription for aerobic and resistive training based on initial evaluation  findings, risk stratification, comorbidities and participant's personal goals.      Expected Outcomes Short Term: Increase workloads from initial exercise prescription for resistance, speed, and METs.;Short Term: Perform resistance training exercises routinely during rehab and add in resistance training at home;Long Term: Improve cardiorespiratory fitness, muscular endurance and strength as measured by increased METs and functional capacity (6MWT) Short Term: Increase workloads from initial exercise prescription for resistance, speed, and METs.;Short Term: Perform resistance training exercises routinely during rehab and add in resistance training at home;Long Term: Improve cardiorespiratory fitness, muscular endurance and strength as measured by increased METs and functional capacity (6MWT)      Able to understand and use rate of perceived exertion (RPE) scale Yes Yes      Intervention Provide education and explanation on how  to use RPE scale Provide education and explanation on how to use RPE scale      Expected Outcomes Short Term: Able to use RPE daily in rehab to express subjective intensity level;Long Term:  Able to use RPE to guide intensity level when exercising independently Short Term: Able to use RPE daily in rehab to express subjective intensity level;Long Term:  Able to use RPE to guide intensity level when exercising independently      Able to understand and use Dyspnea scale Yes Yes      Intervention Provide education and explanation on how to use Dyspnea scale Provide education and explanation on how to use Dyspnea scale      Expected Outcomes Short Term: Able to use Dyspnea scale daily in rehab to express subjective sense of shortness of breath during exertion;Long Term: Able to use Dyspnea scale to guide intensity level when exercising independently Short Term: Able to use Dyspnea scale daily in rehab to express subjective sense of shortness of breath during exertion;Long Term: Able to use  Dyspnea scale to guide intensity level when exercising independently      Knowledge and understanding of Target Heart Rate Range (THRR) Yes Yes      Intervention Provide education and explanation of THRR including how the numbers were predicted and where they are located for reference Provide education and explanation of THRR including how the numbers were predicted and where they are located for reference      Expected Outcomes Short Term: Able to state/look up THRR;Short Term: Able to use daily as guideline for intensity in rehab;Long Term: Able to use THRR to govern intensity when exercising independently Short Term: Able to state/look up THRR;Short Term: Able to use daily as guideline for intensity in rehab;Long Term: Able to use THRR to govern intensity when exercising independently      Understanding of Exercise Prescription Yes Yes      Intervention Provide education, explanation, and written materials on patient's individual exercise prescription Provide education, explanation, and written materials on patient's individual exercise prescription      Expected Outcomes Short Term: Able to explain program exercise prescription;Long Term: Able to explain home exercise prescription to exercise independently Short Term: Able to explain program exercise prescription;Long Term: Able to explain home exercise prescription to exercise independently             Exercise Goals Re-Evaluation :  Exercise Goals Re-Evaluation    Row Name 01/09/20 0751             Exercise Goal Re-Evaluation   Exercise Goals Review Increase Physical Activity;Increase Strength and Stamina;Able to understand and use rate of perceived exertion (RPE) scale;Able to understand and use Dyspnea scale;Knowledge and understanding of Target Heart Rate Range (THRR);Understanding of Exercise Prescription       Comments Pt has completed 5 exercise sessions. He has been able to increase his workloads and his stamina is increasing. He  was severely deconditioned when he entered the program. He currently exercises at 1.9 METs on the stepper. Will continue to monitor and progress as able.       Expected Outcomes Through exercise at rehab and at home, the patient will decrease shortness of breath with daily activities and feel confident in carrying out an exercise regime at home.              Discharge Exercise Prescription (Final Exercise Prescription Changes):  Exercise Prescription Changes - 01/02/20 1503      Response to Exercise  Blood Pressure (Admit) 122/60    Blood Pressure (Exit) 128/60    Heart Rate (Admit) 90 bpm    Heart Rate (Exercise) 103 bpm    Heart Rate (Exit) 91 bpm    Oxygen Saturation (Admit) 98 %    Oxygen Saturation (Exercise) 94 %    Oxygen Saturation (Exit) 97 %    Rating of Perceived Exertion (Exercise) 13    Perceived Dyspnea (Exercise) 2    Duration Continue with 30 min of aerobic exercise without signs/symptoms of physical distress.    Intensity THRR unchanged      Progression   Progression Continue to progress workloads to maintain intensity without signs/symptoms of physical distress.      Resistance Training   Training Prescription Yes    Weight blue bands    Reps 10-15    Time 10 Minutes      Oxygen   Oxygen Continuous    Liters 2      T5 Nustep   Level 2    SPM 80    Minutes 30    METs 1.9           Nutrition:  Target Goals: Understanding of nutrition guidelines, daily intake of sodium 1500mg , cholesterol 200mg , calories 30% from fat and 7% or less from saturated fats, daily to have 5 or more servings of fruits and vegetables.  Biometrics:  Pre Biometrics - 12/13/19 0936      Pre Biometrics   Height 5\' 11"  (1.803 m)    Weight 131.5 kg    BMI (Calculated) 40.45    Grip Strength 32 kg            Nutrition Therapy Plan and Nutrition Goals:  Nutrition Therapy & Goals - 01/10/20 1026      Nutrition Therapy   Diet Carb modified      Personal Nutrition  Goals   Nutrition Goal Pt to check CBGs at home to maintain or reduce A1C      Intervention Plan   Intervention Prescribe, educate and counsel regarding individualized specific dietary modifications aiming towards targeted core components such as weight, hypertension, lipid management, diabetes, heart failure and other comorbidities.    Expected Outcomes Short Term Goal: Understand basic principles of dietary content, such as calories, fat, sodium, cholesterol and nutrients.           Nutrition Assessments:   Nutrition Goals Re-Evaluation:  Nutrition Goals Re-Evaluation    [place] Name 01/10/20 1027             Goals   Current Weight 293 lb 10.4 oz (133.2 kg)       Nutrition Goal Pt to check CBGs at home to maintain or reduce A1C              Nutrition Goals Discharge (Final Nutrition Goals Re-Evaluation):  Nutrition Goals Re-Evaluation - 01/10/20 1027      Goals   Current Weight 293 lb 10.4 oz (133.2 kg)    Nutrition Goal Pt to check CBGs at home to maintain or reduce A1C           Psychosocial: Target Goals: Acknowledge presence or absence of significant depression and/or stress, maximize coping skills, provide positive support system. Participant is able to verbalize types and ability to use techniques and skills needed for reducing stress and depression.  Initial Review & Psychosocial Screening:  Initial Psych Review & Screening - 12/13/19 1008      Initial Review   Current issues with None  Identified      Family Dynamics   Good Support System? Yes      Barriers   Psychosocial barriers to participate in program There are no identifiable barriers or psychosocial needs.      Screening Interventions   Interventions Encouraged to exercise           Quality of Life Scores:  Scores of 19 and below usually indicate a poorer quality of life in these areas.  A difference of  2-3 points is a clinically meaningful difference.  A difference of 2-3 points in the  total score of the Quality of Life Index has been associated with significant improvement in overall quality of life, self-image, physical symptoms, and general health in studies assessing change in quality of life.  PHQ-9: Recent Review Flowsheet Data    Depression screen [facility] 2/9 12/13/2019 07/31/2019 06/21/2019 05/05/2019 04/14/2019   Decreased Interest 0 0 0 3 0   Down, Depressed, Hopeless 0 0 0 0 0   PHQ - 2 Score 0 0 0 3 0   Altered sleeping 0 0 0 3 -   Tired, decreased energy 0 0 1 3 -   Change in appetite 0 0 0 3 -   Feeling bad or failure about yourself  0 0 0 0 -   Trouble concentrating 0 0 0 1 -   Moving slowly or fidgety/restless 0 0 0 0 -   Suicidal thoughts 0 0 0 - -   PHQ-9 Score 0 0 1 13 -   Difficult doing work/chores Not difficult at all Not difficult at all Not difficult at all - -     Interpretation of Total Score  Total Score Depression Severity:  1-4 = Minimal depression, 5-9 = Mild depression, 10-14 = Moderate depression, 15-19 = Moderately severe depression, 20-27 = Severe depression   Psychosocial Evaluation and Intervention:  Psychosocial Evaluation - 12/13/19 1009      Psychosocial Evaluation & Interventions   Interventions Encouraged to exercise with the program and follow exercise prescription    Comments No concerns identified at this time.    Continue Psychosocial Services  No Follow up required           Psychosocial Re-Evaluation:  Psychosocial Re-Evaluation    [place] Name 01/09/20 [phone]             Psychosocial Re-Evaluation   Current issues with None Identified       Comments No psychosocial concerns identified at this time.       Expected Outcomes For patient to continue to be free of psychosocial concerns while participating in pulmonary rehab.       Interventions Encouraged to attend Pulmonary Rehabilitation for the exercise       Continue Psychosocial Services  No Follow up required              Psychosocial Discharge (Final  Psychosocial Re-Evaluation):  Psychosocial Re-Evaluation - 01/09/20 0856      Psychosocial Re-Evaluation   Current issues with None Identified    Comments No psychosocial concerns identified at this time.    Expected Outcomes For patient to continue to be free of psychosocial concerns while participating in pulmonary rehab.    Interventions Encouraged to attend Pulmonary Rehabilitation for the exercise    Continue Psychosocial Services  No Follow up required           Education: Education Goals: Education classes will be provided on a weekly basis, covering required topics. Participant will  state understanding/return demonstration of topics presented.  Learning Barriers/Preferences:  Learning Barriers/Preferences - 12/13/19 1213      Learning Barriers/Preferences   Learning Barriers None    Learning Preferences Audio;Computer/Internet;Group Instruction;Individual Instruction;Pictoral;Skilled Demonstration;Verbal Instruction;Video;Written Material           Education Topics: Risk Factor Reduction:  -Group instruction that is supported by a PowerPoint presentation. Instructor discusses the definition of a risk factor, different risk factors for pulmonary disease, and how the heart and lungs work together.     Nutrition for Pulmonary Patient:  -Group instruction provided by PowerPoint slides, verbal discussion, and written materials to support subject matter. The instructor gives an explanation and review of healthy diet recommendations, which includes a discussion on weight management, recommendations for fruit and vegetable consumption, as well as protein, fluid, caffeine, fiber, sodium, sugar, and alcohol. Tips for eating when patients are short of breath are discussed.   Pursed Lip Breathing:  -Group instruction that is supported by demonstration and informational handouts. Instructor discusses the benefits of pursed lip and diaphragmatic breathing and detailed demonstration on  how to preform both.     PULMONARY REHAB OTHER RESPIRATORY from 12/19/2019 in [facility]  Date 12/19/19  Educator Handout      Oxygen Safety:  -Group instruction provided by PowerPoint, verbal discussion, and written material to support subject matter. There is an overview of What is Oxygen and Why do we need it.  Instructor also reviews how to create a safe environment for oxygen use, the importance of using oxygen as prescribed, and the risks of noncompliance. There is a brief discussion on traveling with oxygen and resources the patient may utilize.   Oxygen Equipment:  -Group instruction provided by [facility] Staff utilizing handouts, written materials, and equipment demonstrations.   Signs and Symptoms:  -Group instruction provided by written material and verbal discussion to support subject matter. Warning signs and symptoms of infection, stroke, and heart attack are reviewed and when to call the physician/911 reinforced. Tips for preventing the spread of infection discussed.   Advanced Directives:  -Group instruction provided by verbal instruction and written material to support subject matter. Instructor reviews Advanced Directive laws and proper instruction for filling out document.   Pulmonary Video:  -Group video education that reviews the importance of medication and oxygen compliance, exercise, good nutrition, pulmonary hygiene, and pursed lip and diaphragmatic breathing for the pulmonary patient.   Exercise for the Pulmonary Patient:  -Group instruction that is supported by a PowerPoint presentation. Instructor discusses benefits of exercise, core components of exercise, frequency, duration, and intensity of an exercise routine, importance of utilizing pulse oximetry during exercise, safety while exercising, and options of places to exercise outside of rehab.     Pulmonary Medications:  -Verbally interactive group education provided  by instructor with focus on inhaled medications and proper administration.   Anatomy and Physiology of the Respiratory System and Intimacy:  -Group instruction provided by PowerPoint, verbal discussion, and written material to support subject matter. Instructor reviews respiratory cycle and anatomical components of the respiratory system and their functions. Instructor also reviews differences in obstructive and restrictive respiratory diseases with examples of each. Intimacy, Sex, and Sexuality differences are reviewed with a discussion on how relationships can change when diagnosed with pulmonary disease. Common sexual concerns are reviewed.   MD DAY -A group question and answer session with a medical doctor that allows participants to ask questions that relate to their pulmonary disease state.  OTHER EDUCATION -Group or individual verbal, written, or video instructions that support the educational goals of the pulmonary rehab program.   PULMONARY REHAB OTHER RESPIRATORY from 12/26/2019 in [facility]  Date 12/26/19  Educator Handout      Holiday Eating Survival Tips:  -Group instruction provided by PowerPoint slides, verbal discussion, and written materials to support subject matter. The instructor gives patients tips, tricks, and techniques to help them not only survive but enjoy the holidays despite the onslaught of food that accompanies the holidays.   Knowledge Questionnaire Score:  Knowledge Questionnaire Score - 12/19/19 1501      Knowledge Questionnaire Score   Pre Score 14/18           Core Components/Risk Factors/Patient Goals at Admission:  Personal Goals and Risk Factors at Admission - 12/13/19 1010      Core Components/Risk Factors/Patient Goals on Admission   Improve shortness of breath with ADL's Yes    Intervention Provide education, individualized exercise plan and daily activity instruction to help decrease symptoms of SOB with  activities of daily living.    Expected Outcomes Short Term: Improve cardiorespiratory fitness to achieve a reduction of symptoms when performing ADLs;Long Term: Be able to perform more ADLs without symptoms or delay the onset of symptoms           Core Components/Risk Factors/Patient Goals Review:   Goals and Risk Factor Review    Row Name 12/13/19 1023 01/09/20 0859           Core Components/Risk Factors/Patient Goals Review   Personal Goals Review Increase knowledge of respiratory medications and ability to use respiratory devices properly.;Improve shortness of breath with ADL's;Develop more efficient breathing techniques such as purse lipped breathing and diaphragmatic breathing and practicing self-pacing with activity. Increase knowledge of respiratory medications and ability to use respiratory devices properly.;Improve shortness of breath with ADL's;Develop more efficient breathing techniques such as purse lipped breathing and diaphragmatic breathing and practicing self-pacing with activity.      Review -- [name] was extremely deconditioned when starting the program, he is making progress albeit slow, he is able to exercise for 30 minutes now on level 2 of the nustep.      Expected Outcomes -- See admission goals.             Core Components/Risk Factors/Patient Goals at Discharge (Final Review):   Goals and Risk Factor Review - 01/09/20 0859      Core Components/Risk Factors/Patient Goals Review   Personal Goals Review Increase knowledge of respiratory medications and ability to use respiratory devices properly.;Improve shortness of breath with ADL's;Develop more efficient breathing techniques such as purse lipped breathing and diaphragmatic breathing and practicing self-pacing with activity.    Review [name] was extremely deconditioned when starting the program, he is making progress albeit slow, he is able to exercise for 30 minutes now on level 2 of the nustep.    Expected Outcomes  See admission goals.           ITP Comments:   Comments: ITP REVIEW Pt is making expected progress toward pulmonary rehab goals after completing 5 sessions. Recommend continued exercise, life style modification, education, and utilization of breathing techniques to increase stamina and strength and decrease shortness of breath with exertion.

## 2020-01-12 ENCOUNTER — Ambulatory Visit: Payer: Medicare Other | Admitting: Adult Health

## 2020-01-15 ENCOUNTER — Telehealth (INDEPENDENT_AMBULATORY_CARE_PROVIDER_SITE_OTHER): Payer: Medicare Other | Admitting: Physician Assistant

## 2020-01-15 ENCOUNTER — Ambulatory Visit: Payer: Medicare Other | Admitting: Podiatry

## 2020-01-15 ENCOUNTER — Encounter: Payer: Self-pay | Admitting: Physician Assistant

## 2020-01-15 ENCOUNTER — Other Ambulatory Visit: Payer: Self-pay

## 2020-01-15 VITALS — Ht 71.0 in | Wt 285.0 lb

## 2020-01-15 DIAGNOSIS — L03115 Cellulitis of right lower limb: Secondary | ICD-10-CM | POA: Diagnosis not present

## 2020-01-15 MED ORDER — CEPHALEXIN 500 MG PO CAPS: 500.0000 mg | ORAL_CAPSULE | Freq: Three times a day (TID) | ORAL | 0 refills | Status: AC

## 2020-01-15 NOTE — Progress Notes (Signed)
Virtual Visit via Video   I connected with Wayne Booth on 01/15/20 at 12:00 PM EDT by a video enabled telemedicine application and verified that I am speaking with the correct person using two identifiers. Location patient: Home Location provider: [facility], Office Persons participating in the virtual visit: Wayne Booth, Lizer PA-C, Wayne Pickler, LPN   I discussed the limitations of evaluation and management by telemedicine and the availability of in person appointments. The patient expressed understanding and agreed to proceed.  I acted as a Education administrator for [employer], [employer], LPN   Subjective:   HPI:   Foot swelling Pt c/o bilateral feet are red and swelling. R>L.  Started end of last week and has worsened. Ms. Jeschke says he has some open sores and have been weeping. She has been giving him Furosemide 80 mg in AM and PM Thurs, Fri, Sat & Sun. Has not helped a lot. Not keeping his legs elevated. He is currently living at his son's apartment because his air conditioning went out.  Wife reports that his R foot looks worse than the left.  Denies fevers, chills, malaise, increased SOB  ROS: See pertinent positives and negatives per HPI.  Patient Active Problem List   Diagnosis Date Noted  . 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 Pulmonary nodule 10/13/2019  . Pneumonia due to COVID-19 virus 04/20/2019  . PTSD (post-traumatic stress disorder) 07/27/2018  . Memory loss 06/10/2018  . Aortic atherosclerosis ([place]) 05/10/2018  . Chronic respiratory failure with hypoxia ([place]) 04/26/2018  . First degree AV block 04/15/2018  . Hyponatremia 04/15/2018  . Pleural effusion 04/14/2018  . CAD (coronary artery disease) 04/09/2018  . Chronic pain 04/09/2018  . CKD (chronic kidney disease) stage 3, GFR 30-59 ml/min   . Dyspnea   . HCAP (healthcare-associated pneumonia) 02/03/2018  . Constipation 01/21/2018  . Chest pain   .  Anticoagulated   . Atrial flutter ([place]) 11/01/2017  . Pain in left knee 07/15/2017  . Bilateral foot pain 07/13/2017  . Community acquired pneumonia 06/18/2017  . 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 VTE (venous thromboembolism) 06/09/2017  . History of adenomatous polyp of colon 04/12/2017  . Chronic pulmonary embolism ([place]) 10/23/2016  . Cervical disc disease 11/13/2015  . Essential tremor 08/14/2015  . Gout 08/14/2015  . Hypothyroidism 07/12/2014  . Hyperglycemia 07/12/2014  . GAD (generalized anxiety disorder) 11/07/2013  . OSA (obstructive sleep apnea) 07/07/2011  . Asthmatic bronchitis 06/09/2011  . Actinic keratosis 11/27/2009  . Osteoarthritis 11/13/2008  . History of prostate cancer-seed implantation 2010.  Dr. Alinda Money 10/02/2008  . Morbid obesity ([place]) 10/02/2008  . History of renal cell carcinoma 12/26/2007  . Insomnia 12/26/2007  . TESTOSTERONE DEFICIENCY 12/28/2006  . Essential hypertension 12/28/2006    Social History   Tobacco Use  . Smoking status: Never Smoker  . Smokeless tobacco: Never Used  Substance Use Topics  . Alcohol use: Yes    Alcohol/week: 2.0 standard drinks    Types: 2 Shots of liquor per week    Comment: socially    Current Outpatient Medications:  .  albuterol (VENTOLIN HFA) 108 (90 Base) MCG/ACT inhaler, Inhale 2 puffs into the lungs every 6 (six) hours as needed for wheezing or shortness of breath., Disp: 18 g, Rfl: 2 .  Budeson-Glycopyrrol-Formoterol (BREZTRI AEROSPHERE) 160-9-4.8 MCG/ACT AERO, Inhale 2 puffs into the lungs in the morning and at bedtime., Disp: 10.7 g, Rfl: 0 .  Budeson-Glycopyrrol-Formoterol (BREZTRI AEROSPHERE) 160-9-4.8 MCG/ACT AERO,  Inhale 2 puffs into the lungs in the morning and at bedtime., Disp: 10.7 g, Rfl: 4 .  budesonide-formoterol (SYMBICORT) 160-4.5 MCG/ACT inhaler, Inhale 2 puffs into the lungs 2 (two) times daily., Disp: 1 Inhaler, Rfl: 5 .  busPIRone (BUSPAR) 5 MG tablet, TAKE ONE TABLET BY MOUTH TWICE A  DAY AS NEEDED FOR ANXIETY, Disp: 90 tablet, Rfl: 2 .  DULoxetine (CYMBALTA) 30 MG capsule, Take 1 capsule (30 mg total) by mouth daily., Disp: 30 capsule, Rfl: 3 .  ELIQUIS 5 MG TABS tablet, TAKE ONE TABLET BY MOUTH TWICE A DAY, Disp: 60 tablet, Rfl: 10 .  escitalopram (LEXAPRO) 20 MG tablet, Take 1 tablet (20 mg total) by mouth daily., Disp: 90 tablet, Rfl: 1 .  furosemide (LASIX) 40 MG tablet, TAKE 1-2 TABELTS BY MOUTH TWO TIMES A DAY, Disp: 120 tablet, Rfl: 4 .  gabapentin (NEURONTIN) 100 MG capsule, Take 3 capsules (300 mg total) by mouth at bedtime., Disp: 90 capsule, Rfl: 5 .  KLOR-CON M20 20 MEQ tablet, TAKE ONE TABLET BY MOUTH TWICE A DAY, Disp: 60 tablet, Rfl: 2 .  levothyroxine (SYNTHROID) 150 MCG tablet, TAKE ONE TABLET BY MOUTH DAILY, Disp: 90 tablet, Rfl: 0 .  metFORMIN (GLUCOPHAGE) 500 MG tablet, Take 1 tablet (500 mg total) by mouth daily with breakfast., Disp: 90 tablet, Rfl: 3 .  montelukast (SINGULAIR) 10 MG tablet, Take 1 tablet (10 mg total) by mouth daily., Disp: 30 tablet, Rfl: 11 .  Multiple Vitamins-Minerals (MULTIVITAMINS THER. W/MINERALS) TABS, Take 1 tablet by mouth daily., Disp: , Rfl:  .  nortriptyline (PAMELOR) 50 MG capsule, Take 1 capsule (50 mg total) by mouth at bedtime., Disp: 90 capsule, Rfl: 1 .  pregabalin (LYRICA) 75 MG capsule, Take 1 capsule (75 mg total) by mouth 2 (two) times daily., Disp: 60 capsule, Rfl: 1 .  propranolol (INDERAL) 10 MG tablet, TAKE ONE TABLET BY MOUTH TWICE A DAY, Disp: 120 tablet, Rfl: 0 .  Spacer/Aero-Holding Chambers (AEROCHAMBER MV) inhaler, Use as instructed, Disp: 1 each, Rfl: 0 .  traMADol (ULTRAM) 50 MG tablet, Take 1 tablet (50 mg total) by mouth every 12 (twelve) hours as needed for moderate pain., Disp: 30 tablet, Rfl: 1 .  traZODone (DESYREL) 50 MG tablet, TAKE ONE-HALF TO 1 FULL TABLET AT BEDTIME AS NEEDED FOR SLEEP, Disp: 90 tablet, Rfl: 2 .  cephALEXin (KEFLEX) 500 MG capsule, Take 1 capsule (500 mg total) by mouth 3  (three) times daily for 7 days., Disp: 21 capsule, Rfl: 0  Allergies  Allergen Reactions  . Ambien [Zolpidem] Anxiety    Anxiety and agitation when tried as sleeper in hospital aug 2019    Objective:   VITALS: Per patient if applicable, see vitals. GENERAL: Alert, appears well and in no acute distress. HEENT: Atraumatic, conjunctiva clear, no obvious abnormalities on inspection of external nose and ears. NECK: Normal movements of the head and neck. CARDIOPULMONARY: No increased WOB. Speaking in clear sentences. I:E ratio WNL.  MS: Moves all visible extremities without noticeable abnormality. PSYCH: Pleasant and cooperative, well-groomed. Speech normal rate and rhythm. Affect is appropriate. Insight and judgement are appropriate. Attention is focused, linear, and appropriate.  NEURO: CN grossly intact. Oriented as arrived to appointment on time with no prompting. Moves both UE equally.  SKIN: Localized erythema to dorsum of L foot    Assessment and Plan:   Friedrich was seen today for foot swelling.  Diagnoses and all orders for this visit:  Cellulitis of right lower extremity  Other orders -     cephALEXin (KEFLEX) 500 MG capsule; Take 1 capsule (500 mg total) by mouth 3 (three) times daily for 7 days.   Discussed plan of care with patient's wife, Wayne Booth.  We will start oral Keflex for symptoms.  I recommended that if he has any worsening symptoms, including fever, worsening shortness of breath, malaise, or worsening appearance of symptoms, he needs to present to the emergency room as soon as possible.  We also scheduled follow-up in the office on Wednesday, 2 days from now.  . Reviewed expectations re: course of current medical issues. . Discussed self-management of symptoms. . Outlined signs and symptoms indicating need for more acute intervention. . Patient verbalized understanding and all questions were answered. Marland Kitchen Health Maintenance issues including appropriate healthy diet,  exercise, and smoking avoidance were discussed with patient. . See orders for this visit as documented in the electronic medical record.  I discussed the assessment and treatment plan with the patient. The patient was provided an opportunity to ask questions and all were answered. The patient agreed with the plan and demonstrated an understanding of the instructions.   The patient was advised to call back or seek an in-person evaluation if the symptoms worsen or if the condition fails to improve as anticipated.   CMA or LPN served as scribe during this visit. History, Physical, and Plan performed by medical provider. The above documentation has been reviewed and is accurate and complete.  [place], [place] 01/15/2020

## 2020-01-16 ENCOUNTER — Encounter (HOSPITAL_COMMUNITY)
Admission: RE | Admit: 2020-01-16 | Discharge: 2020-01-16 | Disposition: A | Discharge status: Home / Self Care | Payer: Medicare Other | Source: Ambulatory Visit | Attending: Pulmonary Disease | Admitting: Pulmonary Disease

## 2020-01-16 ENCOUNTER — Telehealth (HOSPITAL_COMMUNITY): Payer: Self-pay | Admitting: Family Medicine

## 2020-01-16 DIAGNOSIS — J849 Interstitial pulmonary disease, unspecified: Secondary | ICD-10-CM

## 2020-01-17 ENCOUNTER — Encounter: Payer: Self-pay | Admitting: Physician Assistant

## 2020-01-17 ENCOUNTER — Other Ambulatory Visit: Payer: Self-pay

## 2020-01-17 ENCOUNTER — Ambulatory Visit (INDEPENDENT_AMBULATORY_CARE_PROVIDER_SITE_OTHER): Payer: Medicare Other | Admitting: Physician Assistant

## 2020-01-17 VITALS — BP 118/70 | HR 83 | Temp 98.1°F | Ht 71.0 in | Wt 282.0 lb

## 2020-01-17 DIAGNOSIS — L03115 Cellulitis of right lower limb: Secondary | ICD-10-CM

## 2020-01-17 MED ORDER — MELOXICAM 7.5 MG PO TABS: 7.5000 mg | ORAL_TABLET | Freq: Every day | ORAL | 0 refills | Status: DC

## 2020-01-17 NOTE — Patient Instructions (Signed)
It was great to see you!  Continue keflex antibiotic  Use mobic as needed for pain  Any concerns let us know  Take care,  Inda Coke PA-C

## 2020-01-17 NOTE — Progress Notes (Signed)
Wayne Booth is a 79 y.o. male is here for follow up.  I acted as a Education administrator for [employer], PA-C Anselmo Pickler, LPN   History of Present Illness:   Chief Complaint  Patient presents with  . Wound Check    HPI   Wound check Was seen by me on 01/15/20 via video visit. He was started on oral keflex for RLE cellulitis. He started the antibiotics as instructed. Bilateral lower legs slightly red and has some scabs on both and legs and dry and scaly, has overall improved. Bilateral pain in legs at times, nerve pain.  Denies: fever, chills, worsening swelling/redness, worsening pain  There are no preventive care reminders to display for this patient.  Past Medical History:  Diagnosis Date  . Allergy    States his nose runs when he is around grease.   Marland Kitchen Anxiety   . Arthritis   . Atrial fibrillation ([place])   . Cancer ([place])   . Coronary artery disease   . History of total knee replacement   . Hypertension   . Hypothyroidism   . Kidney cancer, primary, with metastasis from kidney to other site [facility])   . Kidney stone   . OSA (obstructive sleep apnea)    pt does not wear cpap at night  . Pituitary cyst ([place])   . Pneumonia    hx of  . Prostate cancer [facility])      Social History   Tobacco Use  . Smoking status: Never Smoker  . Smokeless tobacco: Never Used  Vaping Use  . Vaping Use: Never used  Substance Use Topics  . Alcohol use: Yes    Alcohol/week: 2.0 standard drinks    Types: 2 Shots of liquor per week    Comment: socially  . Drug use: No    Past Surgical History:  Procedure Laterality Date  . ABLATION OF DYSRHYTHMIC FOCUS  01/28/2018  . ANTERIOR CERVICAL DECOMP/DISCECTOMY FUSION N/A 08/29/2012   Procedure: ANTERIOR CERVICAL DECOMPRESSION/DISCECTOMY FUSION 1 LEVEL;  Surgeon: Eustace Moore, MD;  Location: [place] [facility];  Service: Neurosurgery;  Laterality: N/A;  . APPENDECTOMY    . ATRIAL FIBRILLATION ABLATION N/A 01/28/2018   Procedure: ATRIAL FIBRILLATION  ABLATION;  Surgeon: Constance Haw, MD;  Location: [facility];  Service: Cardiovascular;  Laterality: N/A;  . CARDIAC CATHETERIZATION  2008   clean  . CHOLECYSTECTOMY    . COLONOSCOPY W/ POLYPECTOMY    . CRANIOTOMY N/A 11/08/2012   Procedure: CRANIOTOMY HYPOPHYSECTOMY TRANSNASAL APPROACH;  Surgeon: Faythe Ghee, MD;  Location: [place] [facility];  Service: Neurosurgery;  Laterality: N/A;  Transphenoidal Resection of Pituitary Tumor  . FINGER SURGERY Left 2017   Dr Amedeo Plenty  . INSERTION PROSTATE RADIATION SEED    . IR THORACENTESIS ASP PLEURAL SPACE W/IMG GUIDE  04/15/2018  . KIDNEY STONE SURGERY    . KNEE ARTHROSCOPY  2007   left  . NEPHRECTOMY Left   . POSTERIOR CERVICAL LAMINECTOMY Left 04/24/2015   Procedure: Foraminotomy cervical five - cervical six cervical six - cervical seven left;  Surgeon: Eustace Moore, MD;  Location: [facility];  Service: Neurosurgery;  Laterality: Left;  . REPLACEMENT TOTAL KNEE BILATERAL    . RIGHT/LEFT HEART CATH AND CORONARY ANGIOGRAPHY N/A 07/14/2019   Procedure: RIGHT/LEFT HEART CATH AND CORONARY ANGIOGRAPHY;  Surgeon: Jettie Booze, MD;  Location: [place] [facility];  Service: Cardiovascular;  Laterality: N/A;    Family History  Problem Relation Age of  Onset  . Cancer Father        [place], died before patient born  . Cancer Mother        stomach, died when he was young  . Kidney cancer Sister   . Colon cancer Brother 21  . Esophageal cancer Neg Hx   . Rectal cancer Neg Hx   . Stomach cancer Neg Hx     PMHx, SurgHx, SocialHx, FamHx, Medications, and Allergies were reviewed in the Visit Navigator and updated as appropriate.   Patient Active Problem List   Diagnosis Date Noted  . 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 Pulmonary nodule 10/13/2019  . Pneumonia due to COVID-19 virus 04/20/2019  . PTSD (post-traumatic stress disorder) 07/27/2018  . Memory loss 06/10/2018  . Aortic atherosclerosis ([place])  05/10/2018  . Chronic respiratory failure with hypoxia ([place]) 04/26/2018  . First degree AV block 04/15/2018  . Hyponatremia 04/15/2018  . Pleural effusion 04/14/2018  . CAD (coronary artery disease) 04/09/2018  . Chronic pain 04/09/2018  . CKD (chronic kidney disease) stage 3, GFR 30-59 ml/min   . Dyspnea   . HCAP (healthcare-associated pneumonia) 02/03/2018  . Constipation 01/21/2018  . Chest pain   . Anticoagulated   . Atrial flutter ([place]) 11/01/2017  . Pain in left knee 07/15/2017  . Bilateral foot pain 07/13/2017  . Community acquired pneumonia 06/18/2017  . 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 VTE (venous thromboembolism) 06/09/2017  . History of adenomatous polyp of colon 04/12/2017  . Chronic pulmonary embolism ([place]) 10/23/2016  . Cervical disc disease 11/13/2015  . Essential tremor 08/14/2015  . Gout 08/14/2015  . Hypothyroidism 07/12/2014  . Hyperglycemia 07/12/2014  . GAD (generalized anxiety disorder) 11/07/2013  . OSA (obstructive sleep apnea) 07/07/2011  . Asthmatic bronchitis 06/09/2011  . Actinic keratosis 11/27/2009  . Osteoarthritis 11/13/2008  . History of prostate cancer-seed implantation 2010.  Dr. Alinda Money 10/02/2008  . Morbid obesity ([place]) 10/02/2008  . History of renal cell carcinoma 12/26/2007  . Insomnia 12/26/2007  . TESTOSTERONE DEFICIENCY 12/28/2006  . Essential hypertension 12/28/2006    Social History   Tobacco Use  . Smoking status: Never Smoker  . Smokeless tobacco: Never Used  Vaping Use  . Vaping Use: Never used  Substance Use Topics  . Alcohol use: Yes    Alcohol/week: 2.0 standard drinks    Types: 2 Shots of liquor per week    Comment: socially  . Drug use: No    Current Medications and Allergies:    Current Outpatient Medications:  .  albuterol (VENTOLIN HFA) 108 (90 Base) MCG/ACT inhaler, Inhale 2 puffs into the lungs every 6 (six) hours as needed for wheezing or shortness of breath., Disp: 18 g, Rfl: 2 .   Budeson-Glycopyrrol-Formoterol (BREZTRI AEROSPHERE) 160-9-4.8 MCG/ACT AERO, Inhale 2 puffs into the lungs in the morning and at bedtime., Disp: 10.7 g, Rfl: 0 .  Budeson-Glycopyrrol-Formoterol (BREZTRI AEROSPHERE) 160-9-4.8 MCG/ACT AERO, Inhale 2 puffs into the lungs in the morning and at bedtime., Disp: 10.7 g, Rfl: 4 .  budesonide-formoterol (SYMBICORT) 160-4.5 MCG/ACT inhaler, Inhale 2 puffs into the lungs 2 (two) times daily., Disp: 1 Inhaler, Rfl: 5 .  busPIRone (BUSPAR) 5 MG tablet, TAKE ONE TABLET BY MOUTH TWICE A DAY AS NEEDED FOR ANXIETY, Disp: 90 tablet, Rfl: 2 .  cephALEXin (KEFLEX) 500 MG capsule, Take 1 capsule (500 mg total) by mouth 3 (three) times daily for 7 days., Disp: 21 capsule, Rfl: 0 .  DULoxetine (CYMBALTA) 30 MG capsule, Take 1 capsule (  30 mg total) by mouth daily., Disp: 30 capsule, Rfl: 3 .  ELIQUIS 5 MG TABS tablet, TAKE ONE TABLET BY MOUTH TWICE A DAY, Disp: 60 tablet, Rfl: 10 .  escitalopram (LEXAPRO) 20 MG tablet, Take 1 tablet (20 mg total) by mouth daily., Disp: 90 tablet, Rfl: 1 .  furosemide (LASIX) 40 MG tablet, TAKE 1-2 TABELTS BY MOUTH TWO TIMES A DAY, Disp: 120 tablet, Rfl: 4 .  gabapentin (NEURONTIN) 100 MG capsule, Take 3 capsules (300 mg total) by mouth at bedtime., Disp: 90 capsule, Rfl: 5 .  KLOR-CON M20 20 MEQ tablet, TAKE ONE TABLET BY MOUTH TWICE A DAY, Disp: 60 tablet, Rfl: 2 .  levothyroxine (SYNTHROID) 150 MCG tablet, TAKE ONE TABLET BY MOUTH DAILY, Disp: 90 tablet, Rfl: 0 .  metFORMIN (GLUCOPHAGE) 500 MG tablet, Take 1 tablet (500 mg total) by mouth daily with breakfast., Disp: 90 tablet, Rfl: 3 .  montelukast (SINGULAIR) 10 MG tablet, Take 1 tablet (10 mg total) by mouth daily., Disp: 30 tablet, Rfl: 11 .  Multiple Vitamins-Minerals (MULTIVITAMINS THER. W/MINERALS) TABS, Take 1 tablet by mouth daily., Disp: , Rfl:  .  nortriptyline (PAMELOR) 50 MG capsule, Take 1 capsule (50 mg total) by mouth at bedtime., Disp: 90 capsule, Rfl: 1 .  pregabalin  (LYRICA) 75 MG capsule, Take 1 capsule (75 mg total) by mouth 2 (two) times daily., Disp: 60 capsule, Rfl: 1 .  propranolol (INDERAL) 10 MG tablet, TAKE ONE TABLET BY MOUTH TWICE A DAY, Disp: 120 tablet, Rfl: 0 .  Spacer/Aero-Holding Chambers (AEROCHAMBER MV) inhaler, Use as instructed, Disp: 1 each, Rfl: 0 .  traMADol (ULTRAM) 50 MG tablet, Take 1 tablet (50 mg total) by mouth every 12 (twelve) hours as needed for moderate pain., Disp: 30 tablet, Rfl: 1 .  traZODone (DESYREL) 50 MG tablet, TAKE ONE-HALF TO 1 FULL TABLET AT BEDTIME AS NEEDED FOR SLEEP, Disp: 90 tablet, Rfl: 2 .  meloxicam (MOBIC) 7.5 MG tablet, Take 1 tablet (7.5 mg total) by mouth daily., Disp: 30 tablet, Rfl: 0   Allergies  Allergen Reactions  . Ambien [Zolpidem] Anxiety    Anxiety and agitation when tried as sleeper in hospital aug 2019    Review of Systems   ROS  Negative unless otherwise specified per HPI.  Vitals:   Vitals:   01/17/20 1137  BP: 118/70  Pulse: 83  Temp: 98.1 F (36.7 C)  TempSrc: Temporal  SpO2: 93%  Weight: 282 lb (127.9 kg)  Height: 5\' 11"  (1.803 m)     Body mass index is 39.33 kg/m.   Physical Exam:    Physical Exam Vitals and nursing note reviewed.  Constitutional:      Appearance: He is well-developed.  HENT:     Head: Normocephalic.  Eyes:     Conjunctiva/sclera: Conjunctivae normal.     Pupils: Pupils are equal, round, and reactive to light.  Cardiovascular:     Pulses:          Dorsalis pedis pulses are 2+ on the right side and 2+ on the left side.       Posterior tibial pulses are 2+ on the right side and 2+ on the left side.  Pulmonary:     Effort: Pulmonary effort is normal.  Musculoskeletal:        General: Normal range of motion.     Cervical back: Normal range of motion.     Comments: RLE swelling much improved since video visit; erythema persists but improved  Skin:    General: Skin is warm and dry.  Neurological:     Mental Status: He is alert and  oriented to person, place, and time.  Psychiatric:        Behavior: Behavior normal.        Thought Content: Thought content normal.        Judgment: Judgment normal.      Assessment and Plan:    Valdemar was seen today for wound check.  Diagnoses and all orders for this visit:  Cellulitis of right lower extremity Improving.  No red flags on exam.  Continue Keflex.  Wife is asking for Mobic, I did discuss that with Eliquis there is increased bleeding risk but patient and wife are willing to accept these risk for patient to be a little bit more comfortable in regards to his pain.  I have ordered a low-dose of Mobic 7.5 mg daily.  Follow-up with PCP if further concerns.  Other orders -     meloxicam (MOBIC) 7.5 MG tablet; Take 1 tablet (7.5 mg total) by mouth daily.    . Reviewed expectations re: course of current medical issues. . Discussed self-management of symptoms. . Outlined signs and symptoms indicating need for more acute intervention. . Patient verbalized understanding and all questions were answered. . See orders for this visit as documented in the electronic medical record. . Patient received an After Visit Summary.  CMA or LPN served as scribe during this visit. History, Physical, and Plan performed by medical provider. The above documentation has been reviewed and is accurate and complete.  Inda Coke, PA-C , [place] 01/17/2020  Follow-up: No follow-ups on file.

## 2020-01-18 ENCOUNTER — Encounter (HOSPITAL_COMMUNITY): Payer: Medicare Other

## 2020-01-23 ENCOUNTER — Encounter (HOSPITAL_COMMUNITY): Payer: Medicare Other

## 2020-01-25 ENCOUNTER — Encounter (HOSPITAL_COMMUNITY): Payer: Medicare Other

## 2020-01-25 IMAGING — DX DG CHEST 2V
2 series · 2 of 2 positions shown · non-contrast
Comparison: 12/07/2017

CLINICAL DATA: Shortness of breath and history of atrial
fibrillation.

EXAM:
CHEST - 2 VIEW

[chest lat]
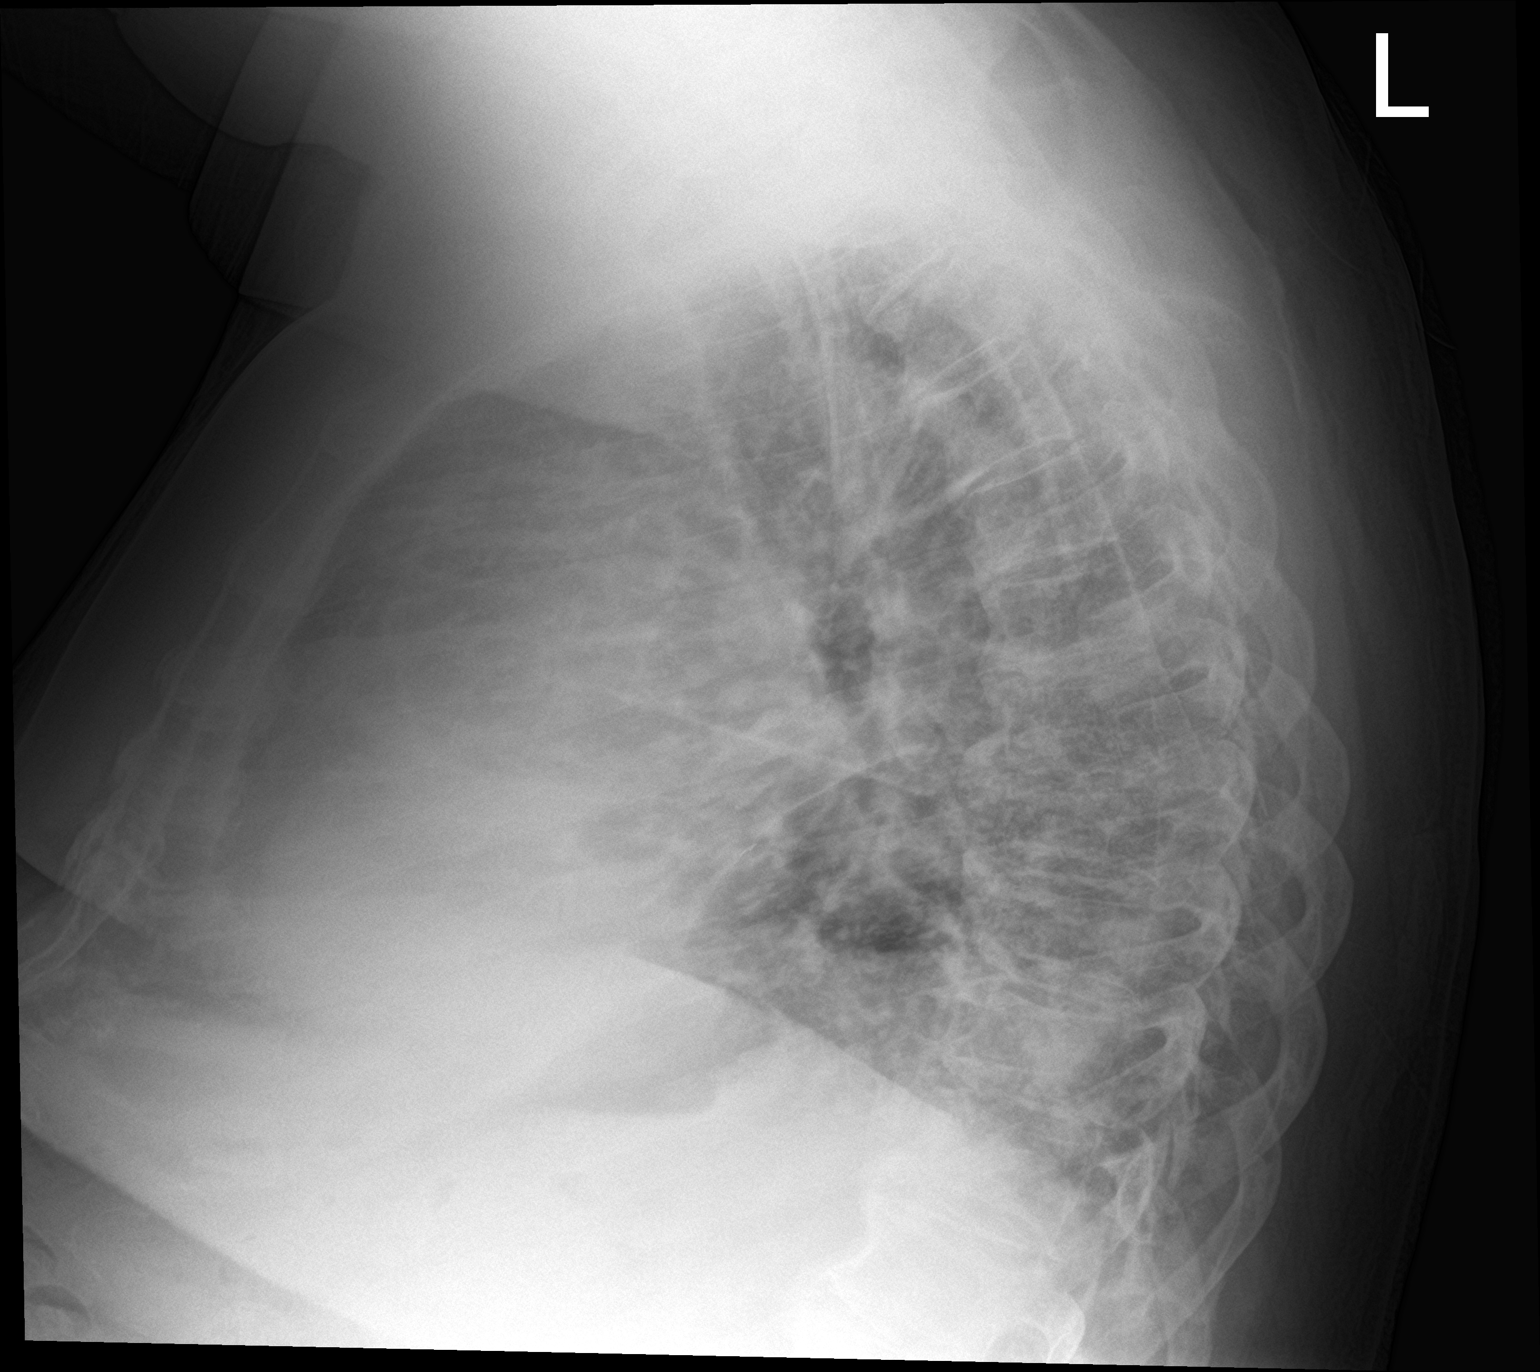

[chest pa]
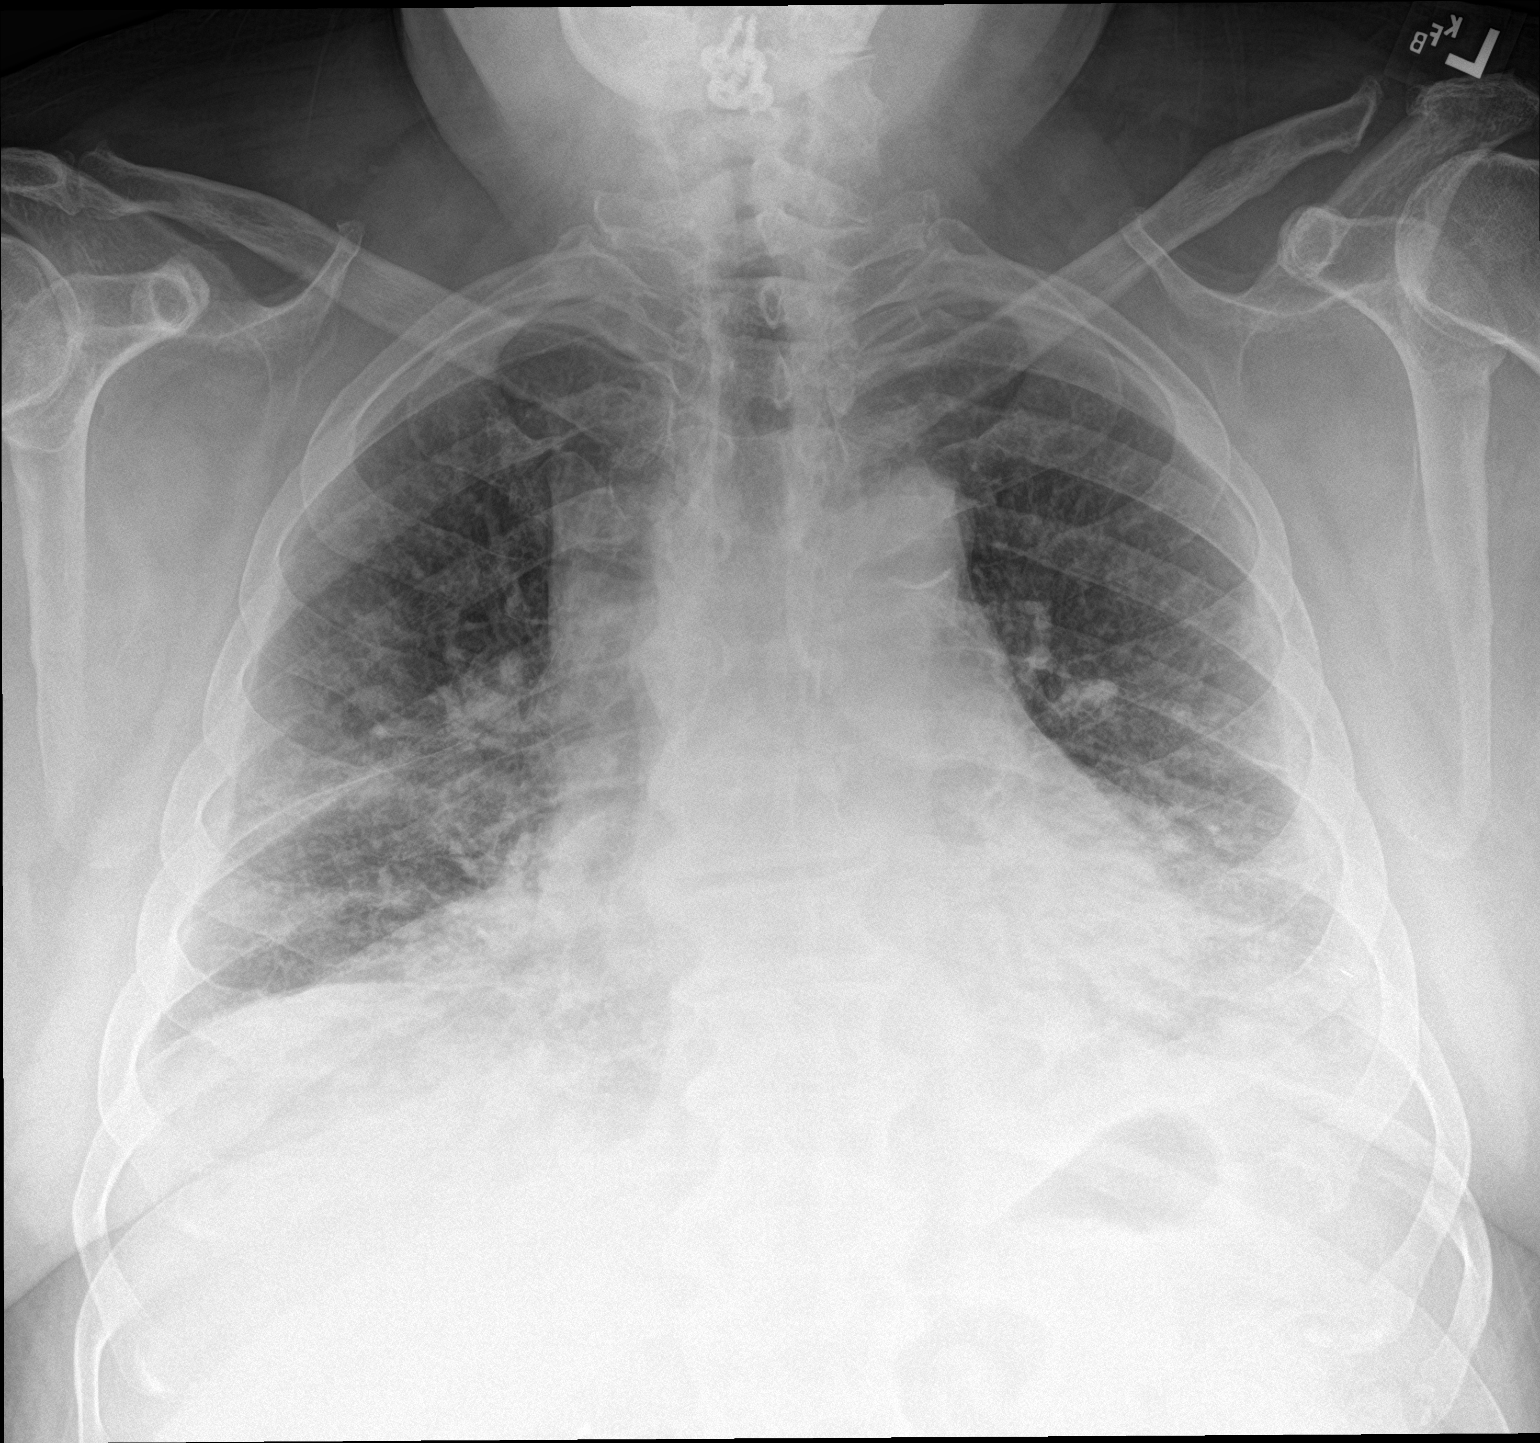

[2 of 2 positions shown; findings below may reference images not displayed]

FINDINGS: The heart is mildly enlarged. There is evidence probable mild
diffuse pulmonary interstitial edema. No significant pleural fluid
identified. No airspace consolidation, pneumothorax or focal mass
identified. Diffuse degenerative disease noted throughout the
visualized spine.
IMPRESSION: Cardiac enlargement and mild diffuse pulmonary interstitial edema.

## 2020-01-27 IMAGING — DX DG CHEST 1V PORT
1 series · 1 of 1 positions shown · non-contrast
Comparison: Chest radiograph January 31, 2018

CLINICAL DATA: Shortness of breath and RIGHT chest pain. Recent
atrial fibrillation.

EXAM:
PORTABLE CHEST 1 VIEW

[chest]
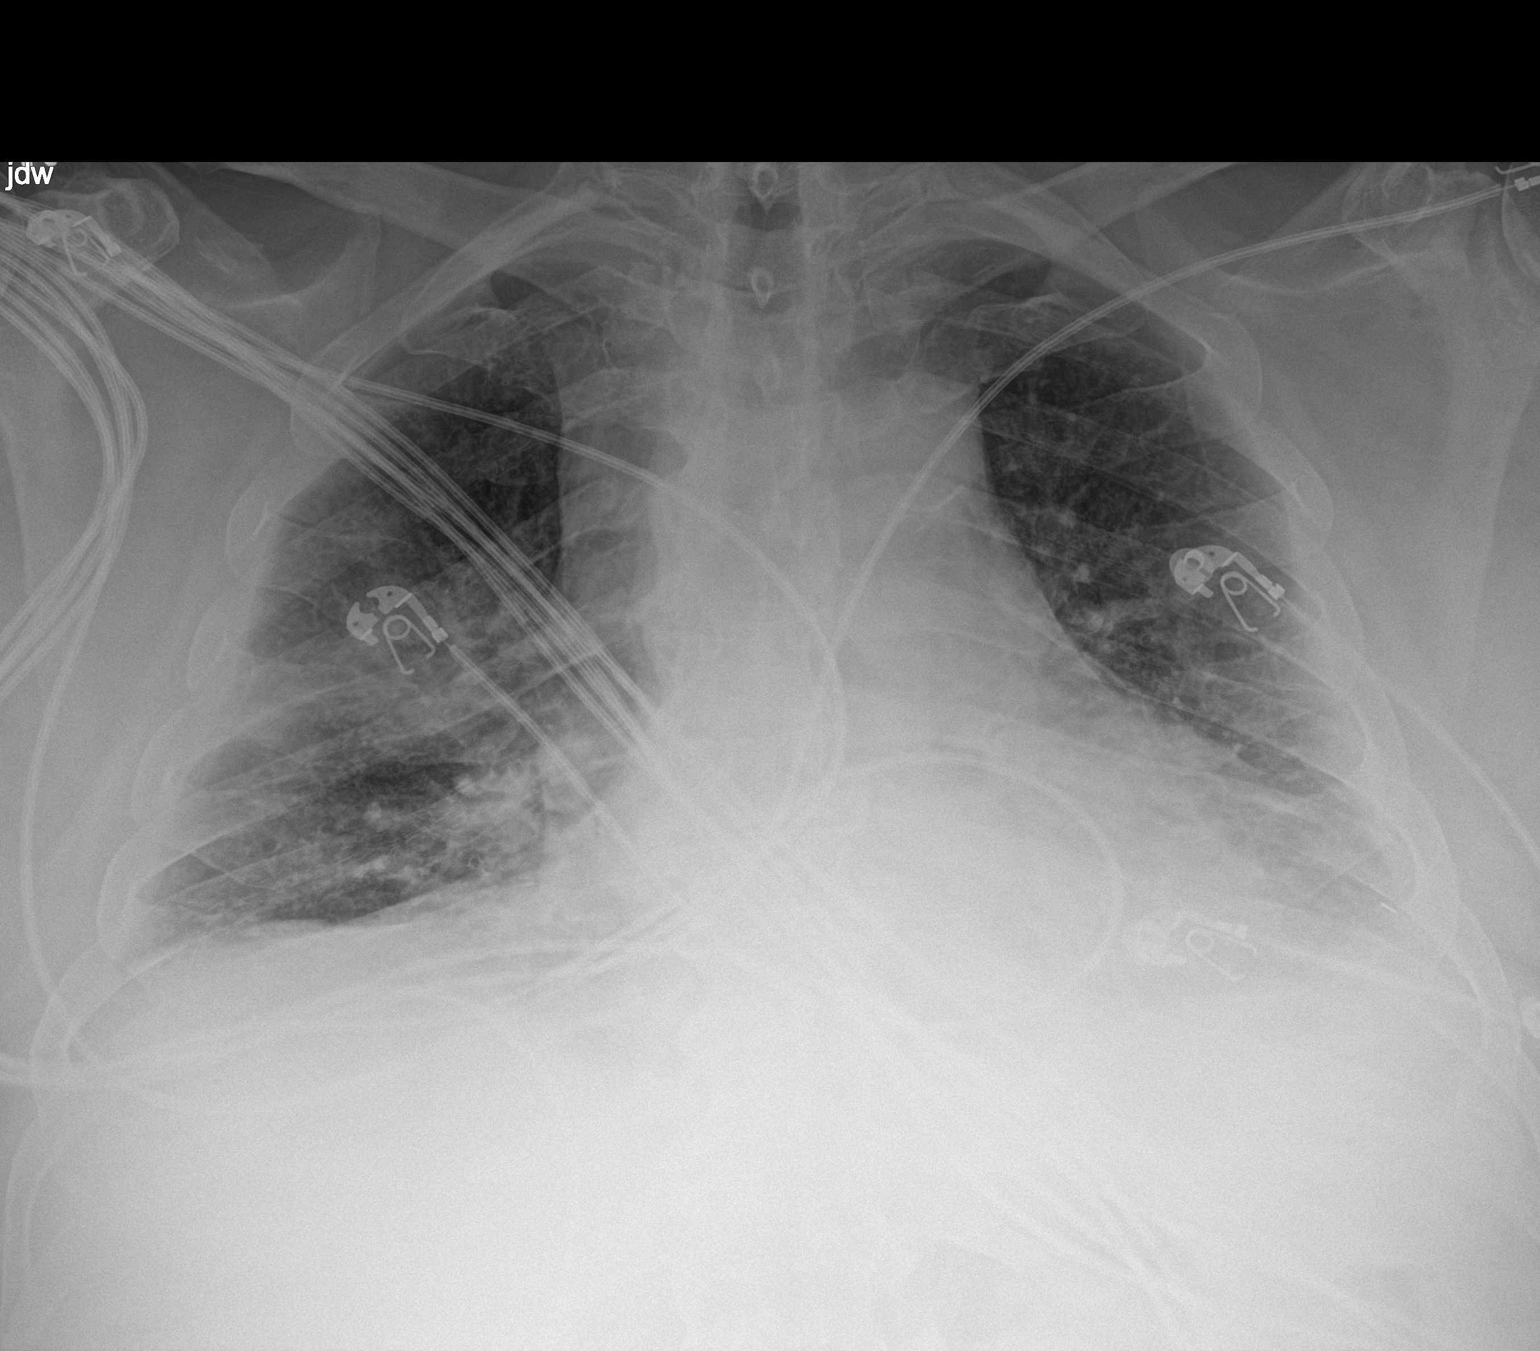

[1 of 1 positions shown; findings below may reference images not displayed]

FINDINGS: Similar cardiomegaly. Calcified aortic arch. Pulmonary vasculature
congestion and interstitial prominence with RIGHT mid lung zone
consolidation. Small bilateral pleural effusions. No pneumothorax.
No pneumothorax. Soft tissue planes and included osseous structures
are non suspicious.
IMPRESSION: 1. New RIGHT mid lung zone consolidation concerning for pneumonia
versus confluent edema.
2. Stable cardiomegaly and interstitial edema with small pleural
effusions.

## 2020-01-29 ENCOUNTER — Other Ambulatory Visit: Payer: Self-pay | Admitting: Family Medicine

## 2020-01-30 ENCOUNTER — Other Ambulatory Visit: Payer: Self-pay

## 2020-01-30 ENCOUNTER — Ambulatory Visit (INDEPENDENT_AMBULATORY_CARE_PROVIDER_SITE_OTHER): Payer: Medicare Other | Admitting: Adult Health

## 2020-01-30 ENCOUNTER — Telehealth (HOSPITAL_COMMUNITY): Payer: Self-pay | Admitting: *Deleted

## 2020-01-30 ENCOUNTER — Telehealth: Payer: Self-pay | Admitting: Family Medicine

## 2020-01-30 ENCOUNTER — Encounter: Payer: Self-pay | Admitting: Adult Health

## 2020-01-30 ENCOUNTER — Encounter (HOSPITAL_COMMUNITY): Payer: Medicare Other

## 2020-01-30 DIAGNOSIS — G4733 Obstructive sleep apnea (adult) (pediatric): Secondary | ICD-10-CM

## 2020-01-30 DIAGNOSIS — J9611 Chronic respiratory failure with hypoxia: Secondary | ICD-10-CM | POA: Diagnosis not present

## 2020-01-30 DIAGNOSIS — I2782 Chronic pulmonary embolism: Secondary | ICD-10-CM

## 2020-01-30 DIAGNOSIS — J4550 Severe persistent asthma, uncomplicated: Secondary | ICD-10-CM | POA: Diagnosis not present

## 2020-01-30 DIAGNOSIS — J8410 Pulmonary fibrosis, unspecified: Secondary | ICD-10-CM | POA: Diagnosis not present

## 2020-01-30 DIAGNOSIS — R5381 Other malaise: Secondary | ICD-10-CM

## 2020-01-30 MED ORDER — BREZTRI AEROSPHERE 160-9-4.8 MCG/ACT IN AERO: 2.0000 | INHALATION_SPRAY | Freq: Two times a day (BID) | RESPIRATORY_TRACT | 0 refills | Status: DC

## 2020-01-30 MED ORDER — BREZTRI AEROSPHERE 160-9-4.8 MCG/ACT IN AERO: 2.0000 | INHALATION_SPRAY | Freq: Two times a day (BID) | RESPIRATORY_TRACT | 5 refills | Status: DC

## 2020-01-30 NOTE — Assessment & Plan Note (Signed)
History of PE and previous atrial flutter.  Patient is to continue on anticoagulation.

## 2020-01-30 NOTE — Addendum Note (Signed)
Addended by: Luanna Salk on: 01/30/2020 02:46 PM   Modules accepted: Orders

## 2020-01-30 NOTE — Assessment & Plan Note (Signed)
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  Patient is to continue on maximum maintenance regimen with BREZTRI .  Asthma action plan with albuterol.  Patient education on medications maintenance versus as needed medicines. Plan  Patient Instructions  Continue on BREZTRI 2 puffs Twice daily  , rinse after use.  Albuterol As needed   Continue on Oxygen 2 L with activity and at bedtime Do not drive is sleepy  Work on healthy weight .  Return back to pulmonary rehab.  Follow up with primary MD for leg issues.  Follow up in 3 months with Dr. Elsworth Soho  Or Chistian Kasler NP and As needed   Please contact office for sooner follow up if symptoms do not improve or worsen or seek emergency care

## 2020-01-30 NOTE — Telephone Encounter (Signed)
Do you need to see before we send referral?

## 2020-01-30 NOTE — Progress Notes (Signed)
@Patient  ID: Wayne Booth, male    DOB: January 19, 1941, 79 y.o.   MRN: [id number]  Chief Complaint  Patient presents with  . Follow-up    Asthma    Referring provider: Marin Olp, MD  HPI: 79 year old male with very minimal smoking history is followed for recurrent pneumonia, asthmatic bronchitis (allergic component) , obstructive sleep apnea, postinflammatory fibrosis, COVID-19 infection October 2020 (treated with remdesivir and Decadron).,  Chronic respiratory failure on oxygen with activity and At bedtime  .  Previous PE in 2018 on lifelong anticoagulation Medical history significant for previous pituitary tumor removal, atrial flutter , renal cell carcinoma status post nephrectomy and diastolic heart failure   TEST/EVENTS :  CT chest June 08, 2017 showed a small left lower lobe area of consolidation.  PFT 07/2017 moderate restriction, DLCO 67%  CT chest October 2019 small to moderate bilateral pleural effusions consolidations within the left greater than right lower lobe and lingula. Hazy density in the left greater than right lower lobe.  2D echo February 04, 2018 showed EF 50 to 55%  12/2018 HRCT Chest -patchy parenchymal groundglass with interstitial coarsening bilaterally similar to 2019 from a multi lobar pneumonia felt to be a component of postinflammatory changes not consistent with UIP.  07/2019 Eosinophils 100>700>800  IGE  367  ANA, CCP , ANCA neg . RA 16.7   Sleep study September 18, 2019 showed moderate sleep apnea with AHI at 26/hour SPO2 low at 82%  PFT shows a decline in lung function with FEV1 at 41%, ratio 88, FVC 34%, no significant bronchodilator response, DLCO 61%.  (Previous DLCO 67%, FEV1 61%-2019)  01/30/20 Follow up : Asthma , OSA , postinflammatory fibrosis, chronic hypoxic respiratory failure on oxygen Patient presents for a 25-month follow-up.  Patient says over the last 4 months he has been feeling okay, gets winded with activities  especially going up stairs or walking long distance.  He denies any flare of cough or wheezing. Last visit pulmonary function testing showed a decline in his lung function with FEV1 at 41% and DLCO at 61%.  Patient did have known post inflammatory fibrosis from presumed  recurrent pneumonia.  He did have COVID-19 in October 2020.  Since then has had a clinical decline.  Repeat high-resolution CT chest September 06, 2019 showed interstitial thickening, parenchymal groundglass and subpleural reticulation.  No honeycombing.  Findings appear more organized than on November 2020.  He was changed from Symbicort to [place] .  He does feel that this has helped since changing inhalers.  We did discuss his rescue inhaler as today he had Symbicort instead of his albuterol.  Patient education was given. Repeat CT chest done January 08, 2020 showed resolved right upper lobe lung nodule. And stable post inflammatory changes.    Patient has known obstructive sleep apnea has been intolerant to CPAP in the past.  He was restarted on his CPAP last visit.  Patient says he did not restart CPAP says he cannot wear this.  Patient education on potential complications of untreated sleep apnea given.  He was referred to pulmonary rehab.  Says it is going well and feels that he is benefiting from this unfortunately had to stop due to leg swelling . Planning to restart next week.   He has been following with his primary care doctor for increased lower extremity swelling and weeping of his lower legs.  Patient is Lasix was adjusted and says his swelling is much better.  Weight has been going  down he is down 13 pounds over the last 6 months.  Patient says his legs have been itching and he has some excoriated areas.  Allergies  Allergen Reactions  . Ambien [Zolpidem] Anxiety    Anxiety and agitation when tried as sleeper in hospital aug 2019    Immunization History  Administered Date(s) Administered  . Fluad Quad(high Dose 65+)  03/16/2019  . Influenza Split 04/01/2011, 03/29/2012  . Influenza Whole 04/12/2007, 03/18/2009, 03/04/2010  . Influenza, High Dose Seasonal PF 04/03/2014, 04/04/2015, 04/24/2016, 04/12/2017, 04/26/2018  . Influenza,inj,Quad PF,6+ Mos 03/22/2013  . Influenza-Unspecified 05/29/2017  . PFIZER SARS-COV-2 Vaccination 08/19/2019, 09/11/2019  . Pneumococcal Conjugate-13 11/12/2014  . Pneumococcal Polysaccharide-23 06/09/2011  . Pneumococcal-Unspecified 06/30/2015  . Td 06/29/1994  . Tdap 06/09/2011  . Zoster 09/22/2007  . Zoster Recombinat (Shingrix) 03/16/2019, 07/19/2019    Past Medical History:  Diagnosis Date  . Allergy    States his nose runs when he is around grease.   Marland Kitchen Anxiety   . Arthritis   . Atrial fibrillation ([place])   . Cancer ([place])   . Coronary artery disease   . History of total knee replacement   . Hypertension   . Hypothyroidism   . Kidney cancer, primary, with metastasis from kidney to other site [facility])   . Kidney stone   . OSA (obstructive sleep apnea)    pt does not wear cpap at night  . Pituitary cyst ([place])   . Pneumonia    hx of  . Prostate cancer ([place])     Tobacco History: Social History   Tobacco Use  Smoking Status Never Smoker  Smokeless Tobacco Never Used   Counseling given: Not Answered   Outpatient Medications Prior to Visit  Medication Sig Dispense Refill  . albuterol (VENTOLIN HFA) 108 (90 Base) MCG/ACT inhaler Inhale 2 puffs into the lungs every 6 (six) hours as needed for wheezing or shortness of breath. 18 g 2  . Budeson-Glycopyrrol-Formoterol (BREZTRI AEROSPHERE) 160-9-4.8 MCG/ACT AERO Inhale 2 puffs into the lungs in the morning and at bedtime. 10.7 g 4  . budesonide-formoterol (SYMBICORT) 160-4.5 MCG/ACT inhaler Inhale 2 puffs into the lungs 2 (two) times daily. 1 Inhaler 5  . busPIRone (BUSPAR) 5 MG tablet TAKE ONE TABLET BY MOUTH TWICE A DAY AS NEEDED FOR ANXIETY 90 tablet 2  . DULoxetine (CYMBALTA) 30 MG capsule Take 1 capsule (30  mg total) by mouth daily. 30 capsule 3  . ELIQUIS 5 MG TABS tablet TAKE ONE TABLET BY MOUTH TWICE A DAY 60 tablet 10  . escitalopram (LEXAPRO) 20 MG tablet Take 1 tablet (20 mg total) by mouth daily. 90 tablet 1  . furosemide (LASIX) 40 MG tablet TAKE 1-2 TABELTS BY MOUTH TWO TIMES A DAY 120 tablet 4  . gabapentin (NEURONTIN) 100 MG capsule Take 3 capsules (300 mg total) by mouth at bedtime. 90 capsule 5  . KLOR-CON M20 20 MEQ tablet TAKE ONE TABLET BY MOUTH TWICE A DAY 60 tablet 2  . levothyroxine (SYNTHROID) 150 MCG tablet TAKE ONE TABLET BY MOUTH DAILY 90 tablet 0  . meloxicam (MOBIC) 7.5 MG tablet Take 1 tablet (7.5 mg total) by mouth daily. 30 tablet 0  . metFORMIN (GLUCOPHAGE) 500 MG tablet Take 1 tablet (500 mg total) by mouth daily with breakfast. 90 tablet 3  . montelukast (SINGULAIR) 10 MG tablet Take 1 tablet (10 mg total) by mouth daily. 30 tablet 11  . Multiple Vitamins-Minerals (MULTIVITAMINS THER. W/MINERALS) TABS Take 1 tablet by mouth daily.    Marland Kitchen  nortriptyline (PAMELOR) 50 MG capsule Take 1 capsule (50 mg total) by mouth at bedtime. 90 capsule 1  . pregabalin (LYRICA) 75 MG capsule Take 1 capsule (75 mg total) by mouth 2 (two) times daily. 60 capsule 1  . propranolol (INDERAL) 10 MG tablet TAKE ONE TABLET BY MOUTH TWICE A DAY 120 tablet 0  . Spacer/Aero-Holding Chambers (AEROCHAMBER MV) inhaler Use as instructed 1 each 0  . traMADol (ULTRAM) 50 MG tablet Take 1 tablet (50 mg total) by mouth every 12 (twelve) hours as needed for moderate pain. 30 tablet 1  . traZODone (DESYREL) 50 MG tablet TAKE ONE-HALF TO 1 FULL TABLET AT BEDTIME AS NEEDED FOR SLEEP 90 tablet 2  . Budeson-Glycopyrrol-Formoterol (BREZTRI AEROSPHERE) 160-9-4.8 MCG/ACT AERO Inhale 2 puffs into the lungs in the morning and at bedtime. 10.7 g 0   No facility-administered medications prior to visit.     Review of Systems:   Constitutional:   No  weight loss, night sweats,  Fevers, chills,  +fatigue, or   lassitude.  HEENT:   No headaches,  Difficulty swallowing,  Tooth/dental problems, or  Sore throat,                No sneezing, itching, ear ache, nasal congestion, post nasal drip,   CV:  No chest pain,  Orthopnea, PND, swelling in lower extremities, anasarca, dizziness, palpitations, syncope.   GI  No heartburn, indigestion, abdominal pain, nausea, vomiting, diarrhea, change in bowel habits, loss of appetite, bloody stools.   Resp: No shortness of breath with exertion or at rest.  No excess mucus, no productive cough,  No non-productive cough,  No coughing up of blood.  No change in color of mucus.  No wheezing.  No chest wall deformity  Skin: LE skin irritation   GU: no dysuria, change in color of urine, no urgency or frequency.  No flank pain, no hematuria   MS:  No joint pain or swelling.  No decreased range of motion.  No back pain.    Physical Exam  BP 116/72 (BP Location: Left Arm, Cuff Size: Normal)   Pulse 90   Temp 97.8 F (36.6 C) (Oral)   Ht 5\' 11"  (1.803 m)   Wt 287 lb 6.4 oz (130.4 kg)   SpO2 93%   BMI 40.08 kg/m   GEN: A/Ox3; pleasant , NAD, BMI 40  HEENT:  [place]/AT,  l, NOSE-clear, THROAT-clear, no lesions, no postnasal drip or exudate noted.   NECK:  Supple w/ fair ROM; no JVD; normal carotid impulses w/o bruits; no thyromegaly or nodules palpated; no lymphadenopathy.    RESP bibasilar crackles   no accessory muscle use, no dullness to percussion  CARD:  RRR, no m/r/g, 1+ peripheral edema, pulses intact, no cyanosis or clubbing.  GI:   Soft & nt; nml bowel sounds; no organomegaly or masses detected.   Musco: Warm bil, no deformities or joint swelling noted.   Neuro: alert, no focal deficits noted.    Skin: Warm, small excoriated areas along LE     Lab Results:  CBC    Component Value Date/Time   WBC 9.5 07/31/2019 1411   RBC 5.21 07/31/2019 1411   HGB 15.9 07/31/2019 1411   HGB 16.6 07/12/2019 1509   HCT 48.3 07/31/2019 1411   HCT 49.6  07/12/2019 1509   PLT 368.0 07/31/2019 1411   PLT 383 07/12/2019 1509   MCV 92.7 07/31/2019 1411   MCV 91 07/12/2019 1509   MCH 30.4 07/12/2019  [place] 31.0 05/29/2019 1628   MCHC 33.0 07/31/2019 1411   RDW 14.4 07/31/2019 1411   RDW 13.2 07/12/2019 1509   LYMPHSABS 1.6 07/31/2019 1411   MONOABS 1.1 (H) 07/31/2019 1411   EOSABS 0.8 (H) 07/31/2019 1411   BASOSABS 0.1 07/31/2019 1411    BMET    Component Value Date/Time   NA 138 07/31/2019 1411   NA 138 07/12/2019 1509   K 4.4 07/31/2019 1411   CL 99 07/31/2019 1411   CO2 29 07/31/2019 1411   GLUCOSE 133 (H) 07/31/2019 1411   BUN 15 07/31/2019 1411   BUN 15 07/12/2019 1509   CREATININE 1.07 07/31/2019 1411   CREATININE 1.16 11/27/2016 1555   CALCIUM 9.2 07/31/2019 1411   GFRNONAA 62 07/12/2019 1509   GFRAA 72 07/12/2019 1509    BNP    Component Value Date/Time   BNP 55.3 05/29/2019 1628    ProBNP    Component Value Date/Time   PROBNP 31.0 03/15/2019 1209    Imaging: CT Chest Wo Contrast  Result Date: 01/08/2020 CLINICAL DATA:  Lung nodule. EXAM: CT CHEST WITHOUT CONTRAST TECHNIQUE: Multidetector CT imaging of the chest was performed following the standard protocol without IV contrast. COMPARISON:  09/06/2019, 05/29/2019 and 01/04/2019. FINDINGS: Cardiovascular: Atherosclerotic calcification of the aorta, aortic valve and coronary arteries. Heart is enlarged. No pericardial effusion. Mediastinum/Nodes: Mediastinal lymph nodes measure up to 10 mm in the low right paratracheal station, unchanged. Hilar regions are difficult to definitively evaluate without IV contrast. No axillary adenopathy. Esophagus is grossly unremarkable. Lungs/Pleura: Image quality is degraded by expiratory phase imaging. Mid and lower lung zone predominant peribronchovascular ground-glass and interlobular/intralobular septal thickening, as before. Previously seen 8 mm nodule in the peripheral right upper lobe on 09/06/2019 has resolved in the  interval. Calcified granuloma in the lingula. Small left pleural effusion, increased from 09/06/2019. Airway is unremarkable. Upper Abdomen: Visualized portions of the liver, right adrenal gland, spleen, pancreas, stomach and bowel are unremarkable. Cholecystectomy. No upper abdominal adenopathy. Musculoskeletal: Degenerative changes in the spine. No worrisome lytic or sclerotic lesions. IMPRESSION: 1. Previously seen peripheral right upper lobe nodule is no longer visualized. 2. Basilar predominant peribronchovascular ground-glass and interlobular/intralobular septal thickening, as on 09/06/2019, findings which may be due to COVID-19 postinflammatory fibrosis. 3. Small left pleural effusion, slightly increased in size. 4. Aortic atherosclerosis (ICD10-I70.0). Coronary artery calcification. Electronically Signed   By: Lorin Picket M.D.   On: 01/08/2020 15:59      PFT Results Latest Ref Rng & Units 10/10/2019 08/11/2017  FVC-Pre L 1.43 2.20  FVC-Predicted Pre % 35 54  FVC-Post L 1.36 2.16  FVC-Predicted Post % 34 53  Pre FEV1/FVC % % 84 79  Post FEV1/FCV % % 88 84  FEV1-Pre L 1.20 1.74  FEV1-Predicted Pre % 41 59  FEV1-Post L 1.20 1.81  DLCO uncorrected ml/min/mmHg 14.73 20.88  DLCO UNC% % 61 67  DLCO corrected ml/min/mmHg 14.73 -  DLCO COR %Predicted % 61 -  DLVA Predicted % 129 132  TLC L 3.45 4.77  TLC % Predicted % 50 69  RV % Predicted % 71 96    No results found for: NITRICOXIDE      Assessment & Plan:   Asthmatic bronchitis Chronic asthmatic bronchitis with allergic component (elevated IgE and eosinophils) Significant decline in pulmonary function testing noted since having COVID-19.  Patient is to continue on maximum maintenance regimen with BREZTRI .  Asthma action plan with albuterol.  Patient education on  medications maintenance versus as needed medicines. Plan  Patient Instructions  Continue on BREZTRI 2 puffs Twice daily  , rinse after use.  Albuterol As needed     Continue on Oxygen 2 L with activity and at bedtime Do not drive is sleepy  Work on healthy weight .  Return back to pulmonary rehab.  Follow up with primary MD for leg issues.  Follow up in 3 months with Dr. Elsworth Soho  Or Inmer Nix NP and As needed   Please contact office for sooner follow up if symptoms do not improve or worsen or seek emergency care       Chronic pulmonary embolism ([place]) History of PE and previous atrial flutter.  Patient is to continue on anticoagulation.  OSA (obstructive sleep apnea) Has been unable to tolerate CPAP.  Patient education given.  Continue on oxygen 2 L at bedtime.  Chronic respiratory failure with hypoxia ([facility]) Exertional hypoxemia and nocturnal hypoxemia.  Continue on oxygen 2 L with activity and at bedtime to keep O2 saturations greater than 88 to 90%.  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  However since COVID-19 infection in October 2020.  Patient has had some clinical decline more noticeable changes on CT scan.  And drop in pulmonary function.  Patient is to continue with pulmonary rehab.  Maximize asthma therapy.  And continue on oxygen to keep O2 saturations greater than 88 to 90%.  Morbid obesity ([place]) Healthy weight loss discussed  Physical deconditioning Patient is continue with pulmonary rehab.     Rexene Edison, NP 01/30/2020

## 2020-01-30 NOTE — Assessment & Plan Note (Signed)
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  However since COVID-19 infection in October 2020.  Patient has had some clinical decline more noticeable changes on CT scan.  And drop in pulmonary function.  Patient is to continue with pulmonary rehab.  Maximize asthma therapy.  And continue on oxygen to keep O2 saturations greater than 88 to 90%.

## 2020-01-30 NOTE — Assessment & Plan Note (Signed)
Exertional hypoxemia and nocturnal hypoxemia.  Continue on oxygen 2 L with activity and at bedtime to keep O2 saturations greater than 88 to 90%.

## 2020-01-30 NOTE — Assessment & Plan Note (Signed)
Healthy weight loss discussed 

## 2020-01-30 NOTE — Patient Instructions (Addendum)
Continue on BREZTRI 2 puffs Twice daily  , rinse after use.  Albuterol As needed   Continue on Oxygen 2 L with activity and at bedtime Do not drive is sleepy  Work on healthy weight .  Return back to pulmonary rehab.  Follow up with primary MD for leg issues.  Follow up in 3 months with Dr. Elsworth Soho  Or Durward Matranga NP and As needed   Please contact office for sooner follow up if symptoms do not improve or worsen or seek emergency care

## 2020-01-30 NOTE — Assessment & Plan Note (Signed)
Has been unable to tolerate CPAP.  Patient education given.  Continue on oxygen 2 L at bedtime.

## 2020-01-30 NOTE — Assessment & Plan Note (Signed)
Patient is continue with pulmonary rehab. ?

## 2020-01-30 NOTE — Telephone Encounter (Signed)
I read through the pulmonology note-are there any open areas on the legs?  If there are any open areas you can refer under venous stasis ulceration

## 2020-01-30 NOTE — Telephone Encounter (Signed)
[name] is calling on behalf of the patient, states that they seen the pulmonologist today and they would prefer to a wound clinic about his legs.

## 2020-01-31 ENCOUNTER — Other Ambulatory Visit: Payer: Self-pay

## 2020-01-31 DIAGNOSIS — I83009 Varicose veins of unspecified lower extremity with ulcer of unspecified site: Secondary | ICD-10-CM

## 2020-01-31 NOTE — Telephone Encounter (Signed)
Called patient could not tell me if he has any open areas but does have drainage and discharge will start referral.

## 2020-02-01 ENCOUNTER — Encounter (HOSPITAL_COMMUNITY): Payer: Medicare Other

## 2020-02-02 DIAGNOSIS — G4733 Obstructive sleep apnea (adult) (pediatric): Secondary | ICD-10-CM | POA: Diagnosis not present

## 2020-02-06 ENCOUNTER — Encounter (HOSPITAL_COMMUNITY): Payer: Medicare Other

## 2020-02-06 ENCOUNTER — Ambulatory Visit: Payer: Medicare Other | Admitting: Podiatry

## 2020-02-06 ENCOUNTER — Telehealth: Payer: Self-pay | Admitting: Family Medicine

## 2020-02-06 NOTE — Progress Notes (Signed)
  Chronic Care Management   Outreach Note  02/06/2020 Name: Wayne Booth MRN: [id number] DOB: 06-04-1941  Referred by: Marin Olp, MD Reason for referral : No chief complaint on file.   An unsuccessful telephone outreach was attempted today. The patient was referred to the pharmacist for assistance with care management and care coordination.   Follow Up Plan:   Earney [place] Upstream Scheduler

## 2020-02-08 ENCOUNTER — Encounter (HOSPITAL_COMMUNITY): Payer: Medicare Other

## 2020-02-13 ENCOUNTER — Encounter (HOSPITAL_COMMUNITY): Payer: Medicare Other

## 2020-02-15 ENCOUNTER — Encounter (HOSPITAL_COMMUNITY): Payer: Medicare Other

## 2020-02-19 ENCOUNTER — Telehealth: Payer: Self-pay | Admitting: Family Medicine

## 2020-02-19 NOTE — Progress Notes (Signed)
°  Chronic Care Management   Outreach Note  02/19/2020 Name: DEVINN HURWITZ MRN: [id number] DOB: 10/10/1940  Referred by: Marin Olp, MD Reason for referral : No chief complaint on file.   An unsuccessful telephone outreach was attempted today. The patient was referred to the pharmacist for assistance with care management and care coordination.   Follow Up Plan:   Earney [place] Upstream Scheduler

## 2020-02-22 ENCOUNTER — Other Ambulatory Visit: Payer: Self-pay | Admitting: Physician Assistant

## 2020-02-22 NOTE — Telephone Encounter (Signed)
Okay to refill Meloxicam 7.5 mg daily?

## 2020-02-22 NOTE — Telephone Encounter (Signed)
Please discussed with patient I would not refill his medication-increases his bleeding risk with Eliquis.  I understand he wanted a short-term refill on this when discussing with another provider at our office but I do not think we should continue the medication

## 2020-02-28 ENCOUNTER — Telehealth: Payer: Self-pay | Admitting: Family Medicine

## 2020-02-28 NOTE — Progress Notes (Signed)
  Chronic Care Management   Outreach Note  02/28/2020 Name: Wayne Booth MRN: [id number] DOB: 08/11/1940  Referred by: Marin Olp, MD Reason for referral : No chief complaint on file.   An unsuccessful telephone outreach was attempted today. The patient was referred to the pharmacist for assistance with care management and care coordination.   Follow Up Plan:   Earney [place] Upstream Scheduler

## 2020-03-01 ENCOUNTER — Encounter (HOSPITAL_BASED_OUTPATIENT_CLINIC_OR_DEPARTMENT_OTHER): Payer: Medicare Other | Admitting: Internal Medicine

## 2020-03-02 ENCOUNTER — Other Ambulatory Visit: Payer: Self-pay | Admitting: Family Medicine

## 2020-03-04 DIAGNOSIS — G4733 Obstructive sleep apnea (adult) (pediatric): Secondary | ICD-10-CM | POA: Diagnosis not present

## 2020-03-06 ENCOUNTER — Telehealth: Payer: Self-pay

## 2020-03-06 NOTE — Telephone Encounter (Signed)
At least needs next visit scheduled before I can refill this. Send back to me once scheduled

## 2020-03-06 NOTE — Telephone Encounter (Signed)
Pt is wanting to seen for lack of energy and always being tired. Next OV available is Dec. Ok to work in as a SD?

## 2020-03-06 NOTE — Telephone Encounter (Signed)
Yes

## 2020-03-06 NOTE — Telephone Encounter (Signed)
.   LAST APPOINTMENT DATE: 03/02/2020   NEXT APPOINTMENT DATE:@Visit  date not found  MEDICATION:traZODone (DESYREL) 50 MG tablet

## 2020-03-07 NOTE — Telephone Encounter (Signed)
Pt scheduled  

## 2020-03-07 NOTE — Telephone Encounter (Signed)
Called and lm for pt tcb. I called and spoke with pharmacy and they state the pt just picked this Rx up on 03/03/20. No refill sent, pharmacy states insurace is blocking pt from picking up refills to soon, so that is the issue.

## 2020-03-08 NOTE — Telephone Encounter (Signed)
Please inform patient

## 2020-03-11 NOTE — Telephone Encounter (Signed)
Patient made aware via my chart message.

## 2020-03-12 NOTE — Progress Notes (Deleted)
Phone [phone] In person visit   Subjective:   Wayne Booth is a 79 y.o. year old very pleasant male patient who presents for/with See problem oriented charting No chief complaint on file.   This visit occurred during the SARS-CoV-2 public health emergency.  Safety protocols were in place, including screening questions prior to the visit, additional usage of staff PPE, and extensive cleaning of exam room while observing appropriate contact time as indicated for disinfecting solutions.   Past Medical History-  Patient Active Problem List   Diagnosis Date Noted  . 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 Pulmonary nodule 10/13/2019  . Pneumonia due to COVID-19 virus 04/20/2019  . PTSD (post-traumatic stress disorder) 07/27/2018  . Memory loss 06/10/2018  . Aortic atherosclerosis ([place]) 05/10/2018  . Chronic respiratory failure with hypoxia ([place]) 04/26/2018  . First degree AV block 04/15/2018  . Hyponatremia 04/15/2018  . Physical deconditioning 04/15/2018  . Pleural effusion 04/14/2018  . CAD (coronary artery disease) 04/09/2018  . Chronic pain 04/09/2018  . CKD (chronic kidney disease) stage 3, GFR 30-59 ml/min   . Dyspnea   . HCAP (healthcare-associated pneumonia) 02/03/2018  . Constipation 01/21/2018  . Chest pain   . Anticoagulated   . Atrial flutter ([place]) 11/01/2017  . Pain in left knee 07/15/2017  . Bilateral foot pain 07/13/2017  . Community acquired pneumonia 06/18/2017  . 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 VTE (venous thromboembolism) 06/09/2017  . History of adenomatous polyp of colon 04/12/2017  . Chronic pulmonary embolism ([place]) 10/23/2016  . Cervical disc disease 11/13/2015  . Essential tremor 08/14/2015  . Gout 08/14/2015  . Hypothyroidism 07/12/2014  . Hyperglycemia 07/12/2014  . GAD (generalized anxiety disorder) 11/07/2013  . OSA (obstructive sleep apnea) 07/07/2011  . Asthmatic bronchitis 06/09/2011  . Actinic keratosis  11/27/2009  . Osteoarthritis 11/13/2008  . History of prostate cancer-seed implantation 2010.  Dr. Alinda Money 10/02/2008  . Morbid obesity ([place]) 10/02/2008  . History of renal cell carcinoma 12/26/2007  . Insomnia 12/26/2007  . TESTOSTERONE DEFICIENCY 12/28/2006  . Essential hypertension 12/28/2006    Medications- reviewed and updated Current Outpatient Medications  Medication Sig Dispense Refill  . traMADol (ULTRAM) 50 MG tablet TAKE 1 TABLET BY MOUTH EVERY 12 HOURS AS NEEDED FOR MODERATE PAIN 30 tablet 0  . albuterol (VENTOLIN HFA) 108 (90 Base) MCG/ACT inhaler Inhale 2 puffs into the lungs every 6 (six) hours as needed for wheezing or shortness of breath. 18 g 2  . Budeson-Glycopyrrol-Formoterol (BREZTRI AEROSPHERE) 160-9-4.8 MCG/ACT AERO Inhale 2 puffs into the lungs in the morning and at bedtime. 10.7 g 4  . Budeson-Glycopyrrol-Formoterol (BREZTRI AEROSPHERE) 160-9-4.8 MCG/ACT AERO Inhale 2 puffs into the lungs in the morning and at bedtime. 10.7 g 5  . busPIRone (BUSPAR) 5 MG tablet TAKE ONE TABLET BY MOUTH TWICE A DAY AS NEEDED FOR ANXIETY 90 tablet 2  . DULoxetine (CYMBALTA) 30 MG capsule Take 1 capsule (30 mg total) by mouth daily. 30 capsule 3  . ELIQUIS 5 MG TABS tablet TAKE ONE TABLET BY MOUTH TWICE A DAY 60 tablet 10  . escitalopram (LEXAPRO) 20 MG tablet Take 1 tablet (20 mg total) by mouth daily. 90 tablet 1  . furosemide (LASIX) 40 MG tablet TAKE 1-2 TABELTS BY MOUTH TWO TIMES A DAY 120 tablet 4  . gabapentin (NEURONTIN) 100 MG capsule Take 3 capsules (300 mg total) by mouth at bedtime. 90 capsule 5  . KLOR-CON M20 20 MEQ tablet TAKE ONE TABLET BY MOUTH TWICE  A DAY 60 tablet 2  . levothyroxine (SYNTHROID) 150 MCG tablet TAKE ONE TABLET BY MOUTH DAILY 90 tablet 0  . meloxicam (MOBIC) 7.5 MG tablet Take 1 tablet (7.5 mg total) by mouth daily. 30 tablet 0  . metFORMIN (GLUCOPHAGE) 500 MG tablet Take 1 tablet (500 mg total) by mouth daily with breakfast. 90 tablet 3  . montelukast  (SINGULAIR) 10 MG tablet Take 1 tablet (10 mg total) by mouth daily. 30 tablet 11  . Multiple Vitamins-Minerals (MULTIVITAMINS THER. W/MINERALS) TABS Take 1 tablet by mouth daily.    . nortriptyline (PAMELOR) 50 MG capsule Take 1 capsule (50 mg total) by mouth at bedtime. 90 capsule 1  . pregabalin (LYRICA) 75 MG capsule Take 1 capsule (75 mg total) by mouth 2 (two) times daily. 60 capsule 1  . propranolol (INDERAL) 10 MG tablet TAKE ONE TABLET BY MOUTH TWICE A DAY 120 tablet 0  . Spacer/Aero-Holding Chambers (AEROCHAMBER MV) inhaler Use as instructed 1 each 0  . traZODone (DESYREL) 50 MG tablet TAKE ONE-HALF TO 1 FULL TABLET AT BEDTIME AS NEEDED FOR SLEEP 90 tablet 2   No current facility-administered medications for this visit.     Objective:  There were no vitals taken for this visit. Gen: NAD, resting comfortably CV: RRR no murmurs rubs or gallops Lungs: CTAB no crackles, wheeze, rhonchi Abdomen: soft/nontender/nondistended/normal bowel sounds. No rebound or guarding.  Ext: no edema Skin: warm, dry Neuro: grossly normal, moves all extremities  ***    Assessment and Plan  Lack of Energy S:***  A/P: ***     ***04/20/2019 covid ***04/14/2019 awv *** overdue CPE  #Heart failure with *** ejection fraction # Chronic respiratory failure- continues on 02 at night after covid 19- no daytime oxgyen- also could be related to asthmatic bronchitis on betrizi instead of symbicort*** followed by pulmonology Dr. Elsworth Soho. ***referred to pulmonary rehab. Also has OSA on cpap S: Medication:*** furosemide 40mg  BID.  Potassium twice daily  Edema: *** Weight gain:*** Shortness of breath: *** Orthopnea/PND: ***  A/P: CHF-***  Chronic respiratory failure- ***   # Atrial fibrillation #Chronic PE- history PE following hand surgery  In past S: Rate controlled with *** propranolol Anticoagulated with *** eliquis 5 mg BID Chadsvasc score of *** Patient is *** followed by cardiology: ***    A/P: ***   #Neuropathy- feet #Cervical disc disease- working to titrate down nortriptyline due to serotonin syndrome risk. Also on gabapentin S: Last visit patient was complaining of burning in his feet particularly when they swell and at times even when not swelling.  Seem to worsen will reduce nortriptyline. Podiatry started Lyrica. Already on gabapentin for neck issues. Pain at last visit was 5 out of 10 during the day and 8 out of 10 at night.  He also had tramadol available for pain which I recommended to use sparingly due to risk of serotonin syndrome with other medications.  B12 has been normal. A1c in prediabetes range. T3 and T4 normal.  We have discussed possible neurology referral***  A/P: ***   # Depression # Generalized anxiety disorder  S: Medication:Lexapro 20Mg , nortriptyline 50mg  down from 75 mg- for cervical disc disease primarily, Buspirone 5Mg  once or twice a week.   Also on trazodone for sleep Depression screen [facility] 2/9 07/31/2019 06/21/2019 05/05/2019 Decreased Interest 0 0 3 Down, Depressed, Hopeless 0 0 0 PHQ - 2 Score 0 0 3 Altered sleeping 0 0 3 Tired, decreased energy 0 1 3 Change in appetite 0  0 3 Feeling bad or failure about yourself  0 0 0 Trouble concentrating 0 0 1 Moving slowly or fidgety/restless 0 0 0 Suicidal thoughts 0 0 - PHQ-9 Score 0 1 13 Difficult doing work/chores Not difficult at all Not difficult at all - Some recent data might be hidden  GAD 7 : Generalized Anxiety Score 12/16/2017 Nervous, Anxious, on Edge 1 Control/stop worrying 0 Worry too much - different things 0 Trouble relaxing 0 Restless 0 Easily annoyed or irritable 1 Afraid - awful might happen 1 Total GAD 7 Score 3 Anxiety Difficulty Not difficult at all A/P: ***  -cyclobenzaprine increases risk of serotonin syndrome *** -tramadol used only once or twice a week ***  # Hyperglycemia/insulin resistance/prediabetes S:  Medication: *** metformin 500mg  daily Exercise  and diet- *** Lab Results Component Value Date  HGBA1C 6.3 07/31/2019  HGBA1C 5.9 05/10/2018  HGBA1C 6.3 12/16/2017  A/P: ***  # postoperative  Hypothyroidism- cannot use TSH S: compliant On thyroid medication-Levothyroxine 150Mg   ROS-No hair or nail changes. No heat/cold intolerance. No constipation or diarrhea. Denies shakiness or anxiety. *** A/P:***   #hypertension S: medication: *** proranolol 10mg  BID Home readings #s: *** BP Readings from Last 3 Encounters: 10/13/19 122/68 08/04/19 126/64 07/31/19 138/70 A/P: ***  #Chronic kidney disease stage III- unilateral kidney S: GFR is typically in the ***range -Patient knows to avoid NSAIDs***  A/P: ***   #Memory loss-intermittent issues with memory after multiple hospitalizations since 2019. MRI of the brain January 2020 largely unremarkable. B12 levels have been normal. Issues even when depression well controlled. -If worsens would plan on MMSE***   #Gout S: *** flares in *** on *** Lab Results Component Value Date  LABURIC 8.6 (H) 12/05/2017  A/P:***   #allergies- on singulair- discussed risks to mood on ***   No problem-specific Assessment & Plan notes found for this encounter.   Recommended follow up: ***No follow-ups on file. Future Appointments  Date Time Provider [place]  03/13/2020  8:40 AM Marin Olp, MD [facility]    Lab/Order associations: No diagnosis found.  No orders of the defined types were placed in this encounter.   Time Spent: *** minutes of total time (7:49 AM***- 7:49 AM***) was spent on the date of the encounter performing the following actions: chart review prior to seeing the patient, obtaining history, performing a medically necessary exam, counseling on the treatment plan, placing orders, and documenting in our EHR.   Return precautions advised.  Clyde Lundborg, CMA

## 2020-03-13 ENCOUNTER — Ambulatory Visit: Payer: Medicare Other | Admitting: Family Medicine

## 2020-03-13 ENCOUNTER — Telehealth: Payer: Self-pay

## 2020-03-13 NOTE — Telephone Encounter (Signed)
Pt called back and thought appointment was at 9:40 not 8:40. Rescheduled for monday

## 2020-03-13 NOTE — Telephone Encounter (Signed)
Unable to get in contact with the patient to see if he was going to come into the office for his appt. VM was full so I couldn't leave a message.

## 2020-03-18 ENCOUNTER — Ambulatory Visit: Payer: Medicare Other | Admitting: Family Medicine

## 2020-03-18 NOTE — Addendum Note (Signed)
Encounter addended by: Rowe Pavy, RN on: 03/18/2020 4:11 PM  Actions taken: Episode resolved, Flowsheet data copied forward, Flowsheet accepted

## 2020-03-26 ENCOUNTER — Telehealth (HOSPITAL_COMMUNITY): Payer: Self-pay | Admitting: Family Medicine

## 2020-03-26 ENCOUNTER — Telehealth (HOSPITAL_COMMUNITY): Payer: Self-pay

## 2020-03-27 ENCOUNTER — Telehealth: Payer: Self-pay | Admitting: Adult Health

## 2020-03-27 ENCOUNTER — Other Ambulatory Visit: Payer: Self-pay | Admitting: *Deleted

## 2020-03-27 NOTE — Telephone Encounter (Signed)
Called and spoke with patient's wife, Arville Go, listed on DPR, unable to determine what level of oxygen he is on and what his oxygen sats are running.  She will call me back on my direct line as soon as he returns.  Patient does not have his cell phone with him at this time.

## 2020-03-27 NOTE — Telephone Encounter (Signed)
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  Patient is to continue on maximum maintenance regimen with BREZTRI .  Asthma action plan with albuterol.  Patient education on medications maintenance versus as needed medicines. Plan  Patient Instructions  Continue on BREZTRI 2 puffs Twice daily  , rinse after use.  Albuterol As needed   Continue on Oxygen 2 L with activity and at bedtime Do not drive is sleepy  Work on healthy weight .  Return back to pulmonary rehab.  Follow up with primary MD for leg issues.  Follow up in 3 months with Dr. Elsworth Soho  Or Parrett NP and As needed   Please contact office for sooner follow up if symptoms do not improve or worsen or seek emergency care       Chronic pulmonary embolism ([place]) History of PE and previous atrial flutter.  Patient is to continue on anticoagulation.  OSA (obstructive sleep apnea) Has been unable to tolerate CPAP.  Patient education given.  Continue on oxygen 2 L at bedtime.  Chronic respiratory failure with hypoxia (H[facility]) Exertional hypoxemia and nocturnal hypoxemia.  Continue on oxygen 2 L with activity and at bedtime to keep O2 saturations greater than 88 to 90%.  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  However since COVID-19 infection in October 2020.  Patient has had some clinical decline more noticeable changes on CT scan.  And drop in pulmonary function.  Patient is to continue with pulmonary rehab.  Maximize asthma  therapy.  And continue on oxygen to keep O2 saturations greater than 88 to 90%.  Morbid obesity ([place]) Healthy weight loss discussed  Physical deconditioning Patient is continue with pulmonary rehab.     Rexene Edison, NP 01/30/2020     Assessment & Plan Note by Melvenia Needles, NP at 01/30/2020 2:44 PM Author: Melvenia Needles, NP Author Type: Nurse Practitioner Filed: 01/30/2020 2:44 PM  Note Status: Written Cosign: Cosign Not Required Encounter Date: 01/30/2020  Problem: Physical deconditioning  Editor: Melvenia Needles, NP (Nurse Practitioner)                 Patient is continue with pulmonary rehab.    Assessment & Plan Note by Melvenia Needles, NP at 01/30/2020 2:43 PM Author: Melvenia Needles, NP Author Type: Nurse Practitioner Filed: 01/30/2020 2:44 PM  Note Status: Written Cosign: Cosign Not Required Encounter Date: 01/30/2020  Problem: Morbid obesity ([place])  Editor: Parrett, Fonnie Mu, NP (Nurse Practitioner)                 Healthy weight loss discussed    Assessment & Plan Note by Melvenia Needles, NP at 01/30/2020 2:42 PM Author: Melvenia Needles, NP Author Type: Nurse Practitioner Filed: 01/30/2020 2:43 PM  Note Status: Written Cosign: Cosign Not Required Encounter Date: 01/30/2020  Problem: Pulmonary fibrosis, 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  However since COVID-19 infection in October 2020.  Patient has had some  clinical decline more noticeable changes on CT scan.  And drop in pulmonary function.  Patient is to continue with pulmonary rehab.  Maximize asthma therapy.  And continue on oxygen to keep O2 saturations greater than 88 to 90%.    Assessment & Plan Note by Melvenia Needles, NP at 01/30/2020 2:42 PM Author: Melvenia Needles, NP Author Type: Nurse Practitioner Filed:  01/30/2020 2:42 PM  Note Status: Written Cosign: Cosign Not Required Encounter Date: 01/30/2020  Problem: Chronic respiratory failure with hypoxia ([place])  Editor: Melvenia Needles, NP (Nurse Practitioner)                 Exertional hypoxemia and nocturnal hypoxemia.  Continue on oxygen 2 L with activity and at bedtime to keep O2 saturations greater than 88 to 90%.    Assessment & Plan Note by Melvenia Needles, NP at 01/30/2020 2:42 PM Author: Melvenia Needles, NP Author Type: Nurse Practitioner Filed: 01/30/2020 2:42 PM  Note Status: Written Cosign: Cosign Not Required Encounter Date: 01/30/2020  Problem: OSA (obstructive sleep apnea)  Editor: Melvenia Needles, NP (Nurse Practitioner)                 Has been unable to tolerate CPAP.  Patient education given.  Continue on oxygen 2 L at bedtime.    Assessment & Plan Note by Melvenia Needles, NP at 01/30/2020 2:41 PM Author: Melvenia Needles, NP Author Type: Nurse Practitioner Filed: 01/30/2020 2:42 PM  Note Status: Written Cosign: Cosign Not Required Encounter Date: 01/30/2020  Problem: Chronic pulmonary embolism ([place])  Editor: Parrett, Fonnie Mu, NP (Nurse Practitioner)                 History of PE and previous atrial flutter.  Patient is to continue on anticoagulation.    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  Patient is to continue on maximum maintenance regimen with BREZTRI .  Asthma action plan with albuterol.  Patient education on medications maintenance versus as needed medicines. Plan    Patient Instructions  Continue on BREZTRI 2 puffs Twice daily  , rinse after use.  Albuterol As needed   Continue on Oxygen 2 L with activity and at bedtime Do not drive is sleepy  Work on healthy weight .  Return back to pulmonary rehab.  Follow up with primary MD for leg issues.  Follow up in 3 months with Dr. Elsworth Soho  Or Parrett NP and As needed   Please contact office for sooner follow up if symptoms do not improve or worsen or seek emergency care         Patient Instructions by Melvenia Needles, NP at 01/30/2020 1:30 PM Author: Melvenia Needles, NP Author Type: Nurse Practitioner Filed: 01/30/2020 2:06 PM  Note Status: Addendum Cosign: Cosign Not Required Encounter Date: 01/30/2020  Editor: Melvenia Needles, NP (Nurse Practitioner)      Prior Versions: 1. Parrett, Fonnie Mu, NP (Nurse Practitioner) at 01/30/2020 2:06 PM - Addendum   2. Parrett, Fonnie Mu,  NP (Nurse Practitioner) at 01/30/2020 2:01 PM - Addendum   3. Parrett, Fonnie Mu, NP (Nurse Practitioner) at 01/30/2020 1:58 PM - Addendum   4. Parrett, Fonnie Mu, NP (Nurse Practitioner) at 01/30/2020 1:53 PM - Addendum   5. Parrett, Fonnie Mu, NP (Nurse Practitioner) at 01/30/2020 1:51 PM - Addendum   6. Parrett, Fonnie Mu, NP (Nurse Practitioner) at 01/30/2020 1:43 PM - Signed      Continue on BREZTRI 2 puffs Twice daily  , rinse after use.  Albuterol As needed   Continue on Oxygen 2 L with activity and at bedtime Do not drive is sleepy  Work on healthy weight .  Return back to pulmonary rehab.  Follow up with primary MD for leg issues.  Follow up in 3 months with Dr. Elsworth Soho  Or Parrett NP and As needed   Please contact office for sooner follow up if symptoms do not improve or worsen or seek emergency care      Instructions    Return in about 3 months (around 05/01/2020) for Follow up with Dr. Elsworth Soho. Continue on BREZTRI 2 puffs Twice daily  , rinse after use.  Albuterol As needed   Continue on Oxygen 2 L with activity and at bedtime Do  not drive is sleepy  Work on healthy weight .  Return back to pulmonary rehab.  Follow up with primary MD for leg issues.  Follow up in 3 months with Dr. Elsworth Soho  Or Parrett NP and As needed   Please contact office for sooner follow up if symptoms do not improve or worsen or seek emergency care        Was appointment offered to patient (explain)?  Yes, scheduled for 4pm 03/28/2020   Reason for call: increase in sob with exertion.  On oxygen at night on 2L.  Denies cough, fever, chills, body aches.  He has had his covid vaccines and has had not sick contacts.  He is using his respiratory medications as prescribed.  (examples of things to ask: : When did symptoms start? Fever? Cough? Productive? Color to sputum? More sputum than usual? Wheezing? Have you needed increased oxygen? Are you taking your respiratory medications? What over the counter measures have you tried?)  Allergies  Allergen Reactions  . Ambien [Zolpidem] Anxiety    Anxiety and agitation when tried as sleeper in hospital aug 2019    Immunization History  Administered Date(s) Administered  . Fluad Quad(high Dose 65+) 03/16/2019  . Influenza Split 04/01/2011, 03/29/2012  . Influenza Whole 04/12/2007, 03/18/2009, 03/04/2010  . Influenza, High Dose Seasonal PF 04/03/2014, 04/04/2015, 04/24/2016, 04/12/2017, 04/26/2018  . Influenza,inj,Quad PF,6+ Mos 03/22/2013  . Influenza-Unspecified 05/29/2017  . PFIZER SARS-COV-2 Vaccination 08/19/2019, 09/11/2019  . Pneumococcal Conjugate-13 11/12/2014  . Pneumococcal Polysaccharide-23 06/09/2011  . Pneumococcal-Unspecified 06/30/2015  . Td 06/29/1994  . Tdap 06/09/2011  . Zoster 09/22/2007  . Zoster Recombinat (Shingrix) 03/16/2019, 07/19/2019

## 2020-03-27 NOTE — Telephone Encounter (Signed)
Patient called back and was able to gather more information regarding his current sob.  He states for the past 3-4 days, he has been more sob with exertion.  He has gone from wearing his oxygen at 2L at night to wearing 4-5L whenever he exerts himself.  He gets very sob just getting up to walk across the room to get the phone.  He states he oxygen sats were 89-90% on RA and he was so sob that he started wearing the oxygen when he exerts himself.  He has a scheduled appointment for tomorrow with Rexene Edison NP.  Dr. Halford Chessman, is there anything that the patient can do or you would suggest between now and then?  Please advise.  Thank you.

## 2020-03-28 ENCOUNTER — Telehealth (HOSPITAL_COMMUNITY): Payer: Self-pay

## 2020-03-28 ENCOUNTER — Encounter: Payer: Self-pay | Admitting: Adult Health

## 2020-03-28 ENCOUNTER — Ambulatory Visit (INDEPENDENT_AMBULATORY_CARE_PROVIDER_SITE_OTHER): Payer: Medicare Other | Admitting: Adult Health

## 2020-03-28 ENCOUNTER — Other Ambulatory Visit: Payer: Self-pay

## 2020-03-28 VITALS — BP 136/68 | HR 63 | Temp 96.9°F | Ht 71.0 in | Wt 279.4 lb

## 2020-03-28 DIAGNOSIS — J9611 Chronic respiratory failure with hypoxia: Secondary | ICD-10-CM

## 2020-03-28 DIAGNOSIS — I8290 Acute embolism and thrombosis of unspecified vein: Secondary | ICD-10-CM | POA: Diagnosis not present

## 2020-03-28 DIAGNOSIS — J8410 Pulmonary fibrosis, unspecified: Secondary | ICD-10-CM | POA: Diagnosis not present

## 2020-03-28 DIAGNOSIS — J4550 Severe persistent asthma, uncomplicated: Secondary | ICD-10-CM

## 2020-03-28 MED ORDER — ALBUTEROL SULFATE (2.5 MG/3ML) 0.083% IN NEBU: 2.5000 mg | INHALATION_SOLUTION | Freq: Four times a day (QID) | RESPIRATORY_TRACT | 3 refills | Status: DC | PRN

## 2020-03-28 MED ORDER — BREZTRI AEROSPHERE 160-9-4.8 MCG/ACT IN AERO: 2.0000 | INHALATION_SPRAY | Freq: Two times a day (BID) | RESPIRATORY_TRACT | 6 refills | Status: DC

## 2020-03-28 MED ORDER — PREDNISONE 10 MG PO TABS: ORAL_TABLET | ORAL | 0 refills | Status: DC

## 2020-03-28 MED ORDER — BREZTRI AEROSPHERE 160-9-4.8 MCG/ACT IN AERO: 2.0000 | INHALATION_SPRAY | Freq: Two times a day (BID) | RESPIRATORY_TRACT | 0 refills | Status: DC

## 2020-03-28 NOTE — Assessment & Plan Note (Signed)
Continue on oxygen 2 L with activity and at bedtime O2 saturation goal is 88 to 90%.

## 2020-03-28 NOTE — Telephone Encounter (Signed)
ATC [phone] and [phone] got voicemail per DPR left detailed message that Dr. Halford Chessman is requesting for him to keep his appointment for today at [place] with [place]. Nothing further needed at this time.

## 2020-03-28 NOTE — Patient Instructions (Addendum)
Prednisone taper over next week  Mucinex DM Twice daily  As needed  Cough /congestion  Continue on BREZTRI 2 puffs Twice daily  -please take as directed.  Albuterol Inhaler 2 puffs every 4hrs as needed for wheezing .  May use Albuterol Neb every 4-6hr as needed.  Please wear your  Oxygen 2l/m with activity and At bedtime  Low salt diet . Legs elevated .  Work on weight loss .  Weigh daily , keep log .  Advance activity as tolerated  Follow up with Dr. Elsworth Soho  Or Lavoy Bernards NP in 4-6 weeks and As needed   Please contact office for sooner follow up if symptoms do not improve or worsen or seek emergency care

## 2020-03-28 NOTE — Assessment & Plan Note (Signed)
On lifelong anticoagulation with Eliquis.

## 2020-03-28 NOTE — Telephone Encounter (Signed)
Needs to keep ROV with Tammy Parrett.

## 2020-03-28 NOTE — Assessment & Plan Note (Signed)
Notable postinflammatory fibrosis on CT scan.  Continue to monitor.

## 2020-03-28 NOTE — Assessment & Plan Note (Signed)
Acute asthmatic bronchitic exacerbation.  Patient has an allergic component.  We will treat with a short prednisone taper.  On return visit repeat CBC with differential and IgE. Continue on trigger prevention.  Continue on current regimen with BREZTRI, Singulair and Zyrtec .  Add albuterol nebulizer as needed.  Patient education on neb use.  Patient is advised on potential dangers and side effects of overuse of albuterol.  Plan  Patient Instructions  Prednisone taper over next week  Mucinex DM Twice daily  As needed  Cough /congestion  Continue on BREZTRI 2 puffs Twice daily  -please take as directed.  Albuterol Inhaler 2 puffs every 4hrs as needed for wheezing .  May use Albuterol Neb every 4-6hr as needed.  Please wear your  Oxygen 2l/m with activity and At bedtime  Low salt diet . Legs elevated .  Work on weight loss .  Weigh daily , keep log .  Advance activity as tolerated  Follow up with Dr. Elsworth Soho  Or Shamiah Kahler NP in 4-6 weeks and As needed   Please contact office for sooner follow up if symptoms do not improve or worsen or seek emergency care

## 2020-03-28 NOTE — Progress Notes (Signed)
@Patient  ID: Wayne Booth, male    DOB: 09/30/1940, 79 y.o.   MRN: [id number]  Chief Complaint  Patient presents with  . Follow-up    Asthma     Referring provider: Marin Olp, MD  HPI: 79 year old male with very minimal smoking history followed for asthmatic bronchitis with allergic component, recurrent pneumonia, obstructive sleep apnea(declines CPAP), postinflammatory fibrosis, chronic respiratory failure on oxygen with activity and at bedtime COVID-19 infection October 2020 required hospitalization with treatment with remdesivir and Decadron. Previous PE in 2018 on lifelong anticoagulation Medical history significant for pituitary tumor removal, atrial flutter, renal cell carcinoma status post nephrectomy and diastolic heart failure  TEST/EVENTS :  CT chest June 08, 2017 showed a small left lower lobe area of consolidation.  PFT 07/2017 moderate restriction, DLCO 67%  CT chest October 2019 small to moderate bilateral pleural effusions consolidations within the left greater than right lower lobe and lingula. Hazy density in the left greater than right lower lobe.  2D echo February 04, 2018 showed EF 50 to 55%  12/2018 HRCT Chest -patchy parenchymal groundglass with interstitial coarsening bilaterally similar to 2019 from a multi lobar pneumonia felt to be a component of postinflammatory changes not consistent with UIP.  07/2019 Eosinophils 100>700>800  IGE  367  ANA, CCP , ANCA neg . RA 16.7   Sleep study September 18, 2019 showed moderate sleep apnea with AHI at 26/hour SPO2 low at 82%  PFTshows a decline in lung function with FEV1 at 41%, ratio 88, FVC 34%, no significant bronchodilator response, DLCO 61%. (Previous DLCO 67%, FEV1 61%-2019)  03/28/2020 Follow up : Asthma , postinflammatory fibrosis, obstructive sleep apnea (declines CPAP ) , history of PE, chronic respiratory failure on oxygen with activity and at bedtime Patient presents for a follow-up  for asthma.  Says that his breathing has not been doing well over the last couple weeks with increased tightness and wheezing.  Has some cough that is minimally productive.  No fever or discolored mucus.  Patient says he gets more short of breath with activities.  Has decreased activity tolerance.  He does have oxygen which he uses on occasion with activity.  Discussion with patient regarding compliance with this and the need to keep O2 saturations greater than 88 to 90%.  Patient does uses oxygen at bedtime.  He has known sleep apnea but declined CPAP.  Patient says he has been working on weight loss.  He has had no increased swelling.  Has been cutting back on his food intake and cutting back on sweets and soda.  Has lost 11 pounds over the last 2 months.  And is down 20 pounds in the last 6 months. Says that when he uses his albuterol he does feel better.  Is requesting an albuterol nebulizer to have at home..   Wants to take a trip to do fishing.  Says he really enjoys this  Allergies  Allergen Reactions  . Ambien [Zolpidem] Anxiety    Anxiety and agitation when tried as sleeper in hospital aug 2019    Immunization History  Administered Date(s) Administered  . Fluad Quad(high Dose 65+) 03/16/2019  . Influenza Split 04/01/2011, 03/29/2012  . Influenza Whole 04/12/2007, 03/18/2009, 03/04/2010  . Influenza, High Dose Seasonal PF 04/03/2014, 04/04/2015, 04/24/2016, 04/12/2017, 04/26/2018  . Influenza,inj,Quad PF,6+ Mos 03/22/2013  . Influenza-Unspecified 05/29/2017  . PFIZER SARS-COV-2 Vaccination 08/19/2019, 09/11/2019  . Pneumococcal Conjugate-13 11/12/2014  . Pneumococcal Polysaccharide-23 06/09/2011  . Pneumococcal-Unspecified 06/30/2015  . Td  06/29/1994  . Tdap 06/09/2011  . Zoster 09/22/2007  . Zoster Recombinat (Shingrix) 03/16/2019, 07/19/2019    Past Medical History:  Diagnosis Date  . Allergy    States his nose runs when he is around grease.   Marland Kitchen Anxiety   . Arthritis   .  Atrial fibrillation ([place])   . Cancer ([place])   . Coronary artery disease   . History of total knee replacement   . Hypertension   . Hypothyroidism   . Kidney cancer, primary, with metastasis from kidney to other site [facility])   . Kidney stone   . OSA (obstructive sleep apnea)    pt does not wear cpap at night  . Pituitary cyst ([place])   . Pneumonia    hx of  . Prostate cancer ([place])     Tobacco History: Social History   Tobacco Use  Smoking Status Never Smoker  Smokeless Tobacco Never Used   Counseling given: Not Answered   Outpatient Medications Prior to Visit  Medication Sig Dispense Refill  . albuterol (VENTOLIN HFA) 108 (90 Base) MCG/ACT inhaler Inhale 2 puffs into the lungs every 6 (six) hours as needed for wheezing or shortness of breath. 18 g 2  . Budeson-Glycopyrrol-Formoterol (BREZTRI AEROSPHERE) 160-9-4.8 MCG/ACT AERO Inhale 2 puffs into the lungs in the morning and at bedtime. 10.7 g 4  . Budeson-Glycopyrrol-Formoterol (BREZTRI AEROSPHERE) 160-9-4.8 MCG/ACT AERO Inhale 2 puffs into the lungs in the morning and at bedtime. 10.7 g 5  . busPIRone (BUSPAR) 5 MG tablet TAKE ONE TABLET BY MOUTH TWICE A DAY AS NEEDED FOR ANXIETY 90 tablet 2  . DULoxetine (CYMBALTA) 30 MG capsule Take 1 capsule (30 mg total) by mouth daily. 30 capsule 3  . ELIQUIS 5 MG TABS tablet TAKE ONE TABLET BY MOUTH TWICE A DAY 60 tablet 10  . escitalopram (LEXAPRO) 20 MG tablet Take 1 tablet (20 mg total) by mouth daily. 90 tablet 1  . furosemide (LASIX) 40 MG tablet TAKE 1-2 TABELTS BY MOUTH TWO TIMES A DAY 120 tablet 4  . gabapentin (NEURONTIN) 100 MG capsule Take 3 capsules (300 mg total) by mouth at bedtime. 90 capsule 5  . KLOR-CON M20 20 MEQ tablet TAKE ONE TABLET BY MOUTH TWICE A DAY 60 tablet 2  . levothyroxine (SYNTHROID) 150 MCG tablet TAKE ONE TABLET BY MOUTH DAILY 90 tablet 0  . meloxicam (MOBIC) 7.5 MG tablet Take 1 tablet (7.5 mg total) by mouth daily. 30 tablet 0  . metFORMIN (GLUCOPHAGE) 500  MG tablet Take 1 tablet (500 mg total) by mouth daily with breakfast. 90 tablet 3  . montelukast (SINGULAIR) 10 MG tablet Take 1 tablet (10 mg total) by mouth daily. 30 tablet 11  . Multiple Vitamins-Minerals (MULTIVITAMINS THER. W/MINERALS) TABS Take 1 tablet by mouth daily.    . nortriptyline (PAMELOR) 50 MG capsule Take 1 capsule (50 mg total) by mouth at bedtime. 90 capsule 1  . pregabalin (LYRICA) 75 MG capsule Take 1 capsule (75 mg total) by mouth 2 (two) times daily. 60 capsule 1  . propranolol (INDERAL) 10 MG tablet TAKE ONE TABLET BY MOUTH TWICE A DAY 120 tablet 0  . Spacer/Aero-Holding Chambers (AEROCHAMBER MV) inhaler Use as instructed 1 each 0  . traMADol (ULTRAM) 50 MG tablet TAKE 1 TABLET BY MOUTH EVERY 12 HOURS AS NEEDED FOR MODERATE PAIN 30 tablet 0  . traZODone (DESYREL) 50 MG tablet TAKE ONE-HALF TO 1 FULL TABLET AT BEDTIME AS NEEDED FOR SLEEP 90 tablet 2  No facility-administered medications prior to visit.     Review of Systems:   Constitutional:   No  weight loss, night sweats,  Fevers, chills +, fatigue, or  lassitude.  HEENT:   No headaches,  Difficulty swallowing,  Tooth/dental problems, or  Sore throat,                No sneezing, itching, ear ache, nasal congestion, post nasal drip,   CV:  No chest pain,  Orthopnea, PND, swelling in lower extremities, anasarca, dizziness, palpitations, syncope.   GI  No heartburn, indigestion, abdominal pain, nausea, vomiting, diarrhea, change in bowel habits, loss of appetite, bloody stools.   Resp:    No chest wall deformity  Skin: no rash or lesions.  GU: no dysuria, change in color of urine, no urgency or frequency.  No flank pain, no hematuria   MS:  No joint pain or swelling.  No decreased range of motion.  No back pain.    Physical Exam  BP 136/68 (BP Location: Left Arm, Cuff Size: Normal)   Pulse 63   Temp (!) 96.9 F (36.1 C) (Temporal)   Ht 5\' 11"  (1.803 m)   Wt 279 lb 6.4 oz (126.7 kg)   SpO2 93%  Comment: RA  BMI 38.97 kg/m   GEN: A/Ox3; pleasant , NAD, BMI 38   HEENT:  [place]/AT,   NOSE-clear, THROAT-clear, no lesions, no postnasal drip or exudate noted.   NECK:  Supple w/ fair ROM; no JVD; normal carotid impulses w/o bruits; no thyromegaly or nodules palpated; no lymphadenopathy.    RESP faint expiratory wheeze on forced expiration speaks in full sentences without increased work of breathing  no accessory muscle use, no dullness to percussion  CARD:  RRR, no m/r/g, tr  peripheral edema, pulses intact, no cyanosis or clubbing.  GI:   Soft & nt; nml bowel sounds; no organomegaly or masses detected.   Musco: Warm bil, no deformities or joint swelling noted.   Neuro: alert, no focal deficits noted.    Skin: Warm, no lesions or rashes    Lab Results:  CBC    Component Value Date/Time   WBC 9.5 07/31/2019 1411   RBC 5.21 07/31/2019 1411   HGB 15.9 07/31/2019 1411   HGB 16.6 07/12/2019 1509   HCT 48.3 07/31/2019 1411   HCT 49.6 07/12/2019 1509   PLT 368.0 07/31/2019 1411   PLT 383 07/12/2019 1509   MCV 92.7 07/31/2019 1411   MCV 91 07/12/2019 1509   MCH 30.4 07/12/2019 1509   MCH 31.0 05/29/2019 1628   MCHC 33.0 07/31/2019 1411   RDW 14.4 07/31/2019 1411   RDW 13.2 07/12/2019 1509   LYMPHSABS 1.6 07/31/2019 1411   MONOABS 1.1 (H) 07/31/2019 1411   EOSABS 0.8 (H) 07/31/2019 1411   BASOSABS 0.1 07/31/2019 1411    BMET    Component Value Date/Time   NA 138 07/31/2019 1411   NA 138 07/12/2019 1509   K 4.4 07/31/2019 1411   CL 99 07/31/2019 1411   CO2 29 07/31/2019 1411   GLUCOSE 133 (H) 07/31/2019 1411   BUN 15 07/31/2019 1411   BUN 15 07/12/2019 1509   CREATININE 1.07 07/31/2019 1411   CREATININE 1.16 11/27/2016 1555   CALCIUM 9.2 07/31/2019 1411   GFRNONAA 62 07/12/2019 1509   GFRAA 72 07/12/2019 1509    BNP    Component Value Date/Time   BNP 55.3 05/29/2019 1628    ProBNP    Component Value Date/Time  PROBNP 31.0 03/15/2019 1209     Imaging: No results found.    PFT Results Latest Ref Rng & Units 10/10/2019 08/11/2017  FVC-Pre L 1.43 2.20  FVC-Predicted Pre % 35 54  FVC-Post L 1.36 2.16  FVC-Predicted Post % 34 53  Pre FEV1/FVC % % 84 79  Post FEV1/FCV % % 88 84  FEV1-Pre L 1.20 1.74  FEV1-Predicted Pre % 41 59  FEV1-Post L 1.20 1.81  DLCO uncorrected ml/min/mmHg 14.73 20.88  DLCO UNC% % 61 67  DLCO corrected ml/min/mmHg 14.73 -  DLCO COR %Predicted % 61 -  DLVA Predicted % 129 132  TLC L 3.45 4.77  TLC % Predicted % 50 69  RV % Predicted % 71 96    No results found for: NITRICOXIDE      Assessment & Plan:   Asthmatic bronchitis Acute asthmatic bronchitic exacerbation.  Patient has an allergic component.  We will treat with a short prednisone taper.  On return visit repeat CBC with differential and IgE. Continue on trigger prevention.  Continue on current regimen with BREZTRI, Singulair and Zyrtec .  Add albuterol nebulizer as needed.  Patient education on neb use.  Patient is advised on potential dangers and side effects of overuse of albuterol.  Plan  Patient Instructions  Prednisone taper over next week  Mucinex DM Twice daily  As needed  Cough /congestion  Continue on BREZTRI 2 puffs Twice daily  -please take as directed.  Albuterol Inhaler 2 puffs every 4hrs as needed for wheezing .  May use Albuterol Neb every 4-6hr as needed.  Please wear your  Oxygen 2l/m with activity and At bedtime  Low salt diet . Legs elevated .  Work on weight loss .  Weigh daily , keep log .  Advance activity as tolerated  Follow up with Dr. 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  Chronic respiratory failure with hypoxia (HCC) Continue on oxygen 2 L with activity and at bedtime O2 saturation goal is 88 to  90%.  Pulmonary fibrosis, 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HCC) Notable postinflammatory fibrosis on CT scan.  Continue to monitor.     Rexene Edison, NP 03/28/2020

## 2020-04-01 ENCOUNTER — Telehealth (HOSPITAL_COMMUNITY): Payer: Self-pay | Admitting: Family Medicine

## 2020-04-02 ENCOUNTER — Encounter (HOSPITAL_COMMUNITY): Payer: Self-pay | Admitting: *Deleted

## 2020-04-02 ENCOUNTER — Telehealth: Payer: Self-pay | Admitting: Pulmonary Disease

## 2020-04-02 DIAGNOSIS — J8410 Pulmonary fibrosis, unspecified: Secondary | ICD-10-CM

## 2020-04-02 DIAGNOSIS — J1282 Pneumonia due to coronavirus disease 2019: Secondary | ICD-10-CM

## 2020-04-02 DIAGNOSIS — U071 COVID-19: Secondary | ICD-10-CM

## 2020-04-02 DIAGNOSIS — J849 Interstitial pulmonary disease, unspecified: Secondary | ICD-10-CM

## 2020-04-02 NOTE — Telephone Encounter (Signed)
Okay to place order to restart

## 2020-04-02 NOTE — Progress Notes (Signed)
I called Dr. Elsworth Soho office to ask for a new referral for Wayne Booth. He has called [place] and left [place] a message that he is ready to restart the Pulmonary Rehab Undergrad program. I spoke with [facility].

## 2020-04-02 NOTE — Telephone Encounter (Signed)
Order placed for pulmonary rehab. Nothing further needed at this time.

## 2020-04-02 NOTE — Telephone Encounter (Signed)
Received call from Lattie Haw a pulmonary rehab who states that patient was doing pulmonary rehab and had to quit due to cellulitis and that he is feeling better and like for an order to be placed for him to start back.  Dr. Elsworth Soho please advise if you are ok with [place] placing order

## 2020-04-03 ENCOUNTER — Encounter (HOSPITAL_COMMUNITY): Payer: Self-pay | Admitting: *Deleted

## 2020-04-03 DIAGNOSIS — G4733 Obstructive sleep apnea (adult) (pediatric): Secondary | ICD-10-CM | POA: Diagnosis not present

## 2020-04-03 NOTE — Progress Notes (Signed)
Received referral from Dr. Elsworth Soho for this pt to return to pulmonary rehab with the the diagnosis of ILD. Pt referred back in the summer and was unable to continue due to cellulitis. Pt doing much better and is ready to start back.  Clinical review of pt follow up appt on 9/30 with Rexene Edison NP Pulmonary office note.  Pt with Covid Risk Score - 8. Pt appropriate for scheduling for Pulmonary rehab.  Will forward to support staff for scheduling and verification of insurance eligibility/benefits with pt consent. Cherre Huger, BSN Cardiac and Training and development officer

## 2020-04-08 ENCOUNTER — Other Ambulatory Visit: Payer: Self-pay | Admitting: Family Medicine

## 2020-04-08 IMAGING — CT CT CHEST W/O CM
2 of 3 series · 15 of 36 positions shown, 18 images · non-contrast
Comparison: Chest x-ray 04/14/2018, CT chest 02/03/2018, CT abdomen
pelvis 05/22/2011

CLINICAL DATA: Shortness of breath

EXAM:
CT CHEST WITHOUT CONTRAST
TECHNIQUE: Multidetector CT imaging of the chest was performed following the
standard protocol without IV contrast.

[Series 3: chest w/o 2mm st · axial · non-contrast · 0.82mm/px · z∈[+1210,+1474]mm · 12 of 156 slices shown, 15 images]
[im 12/156  mediastinal]
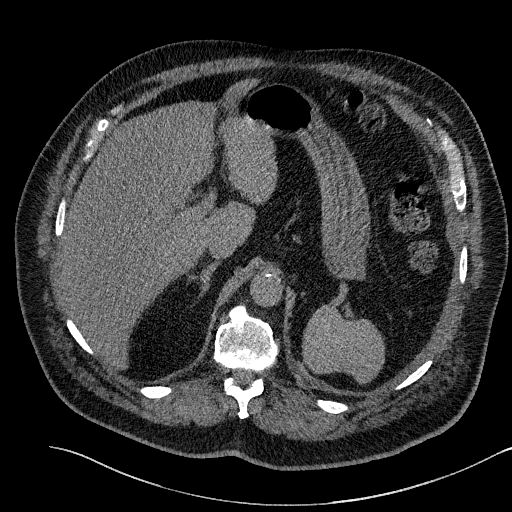
[im 12/156  lung]
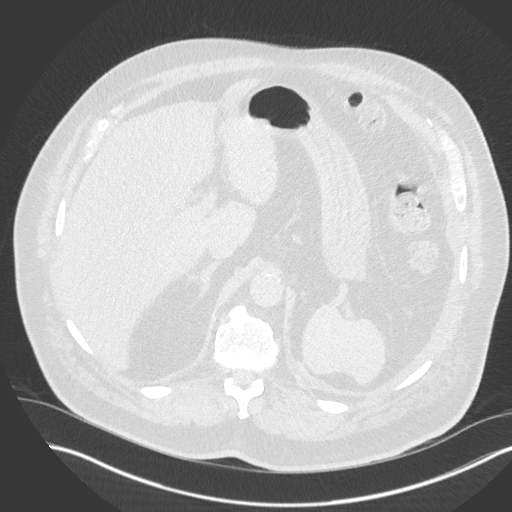
[im 23/156  lung]
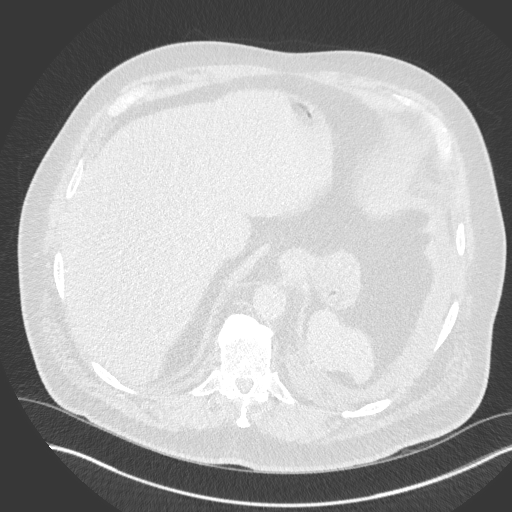
[im 35/156  lung]
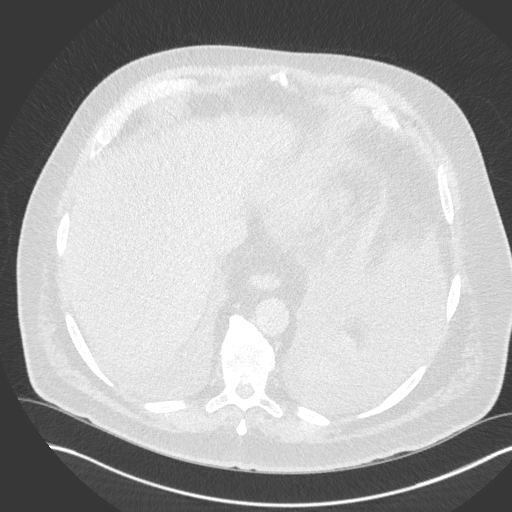
[im 46/156  lung]
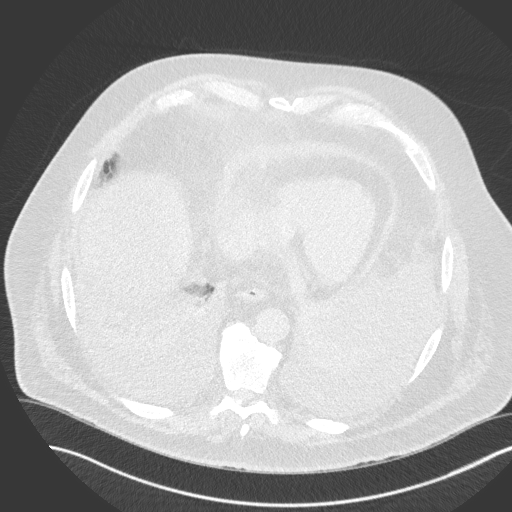
[im 58/156  mediastinal]
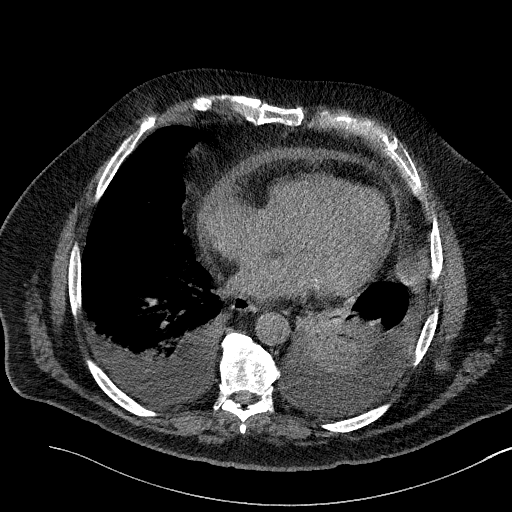
[im 58/156  lung]
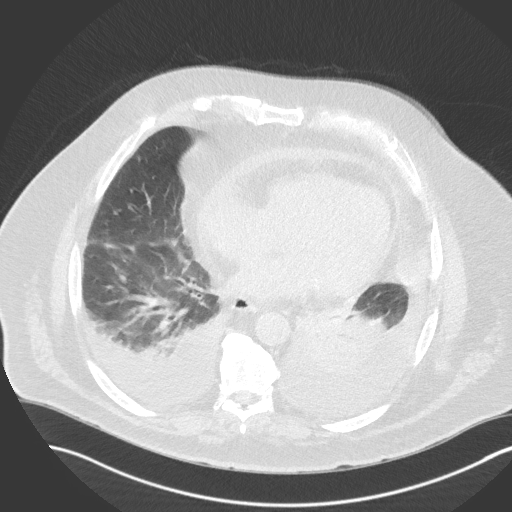
[im 69/156  lung]
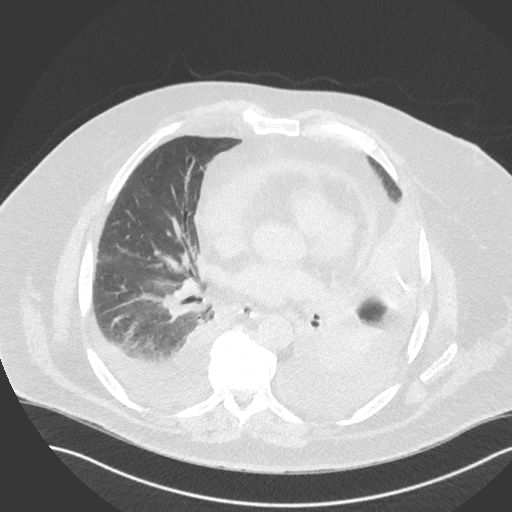
[im 87/156  lung]
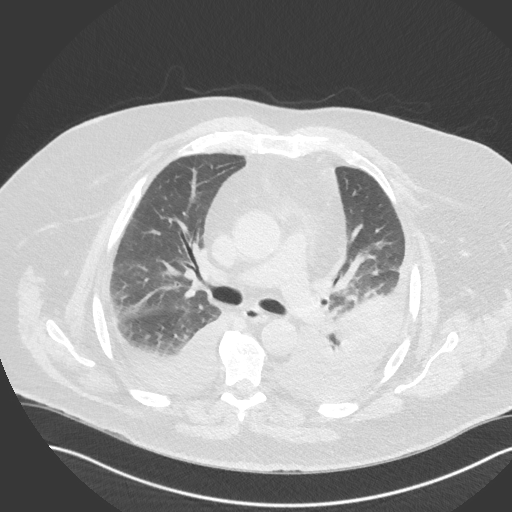
[im 98/156  lung]
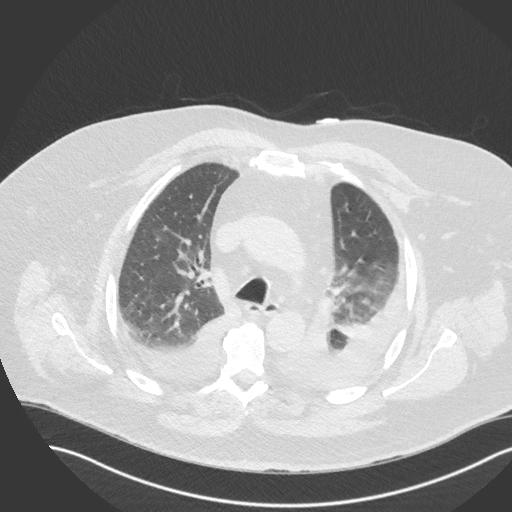
[im 110/156  mediastinal]
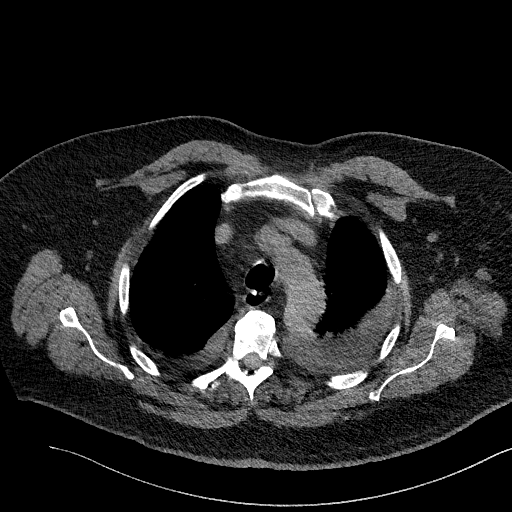
[im 110/156  lung]
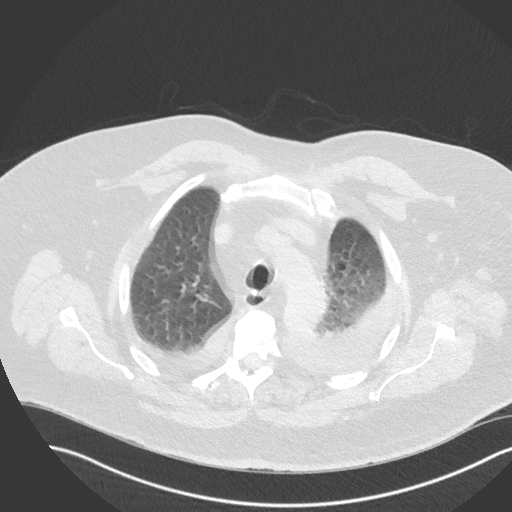
[im 121/156  lung]
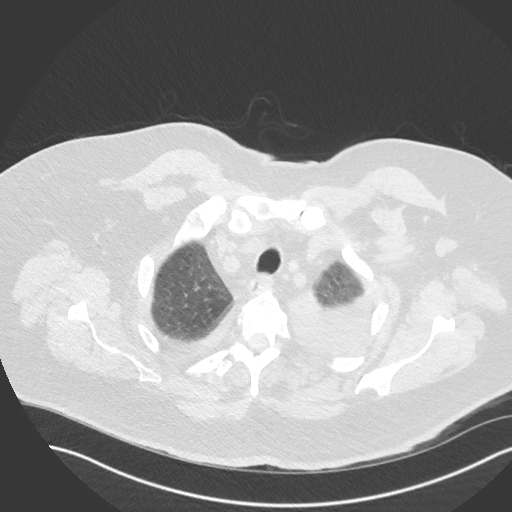
[im 133/156  lung]
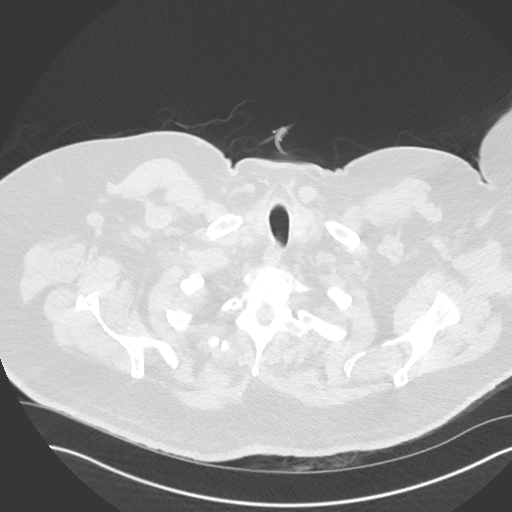
[im 144/156  lung]
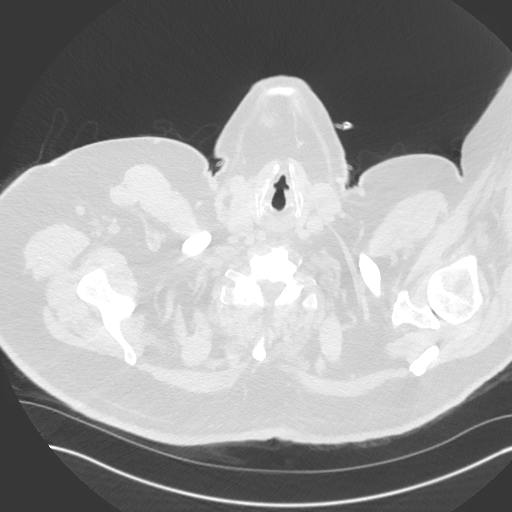

[Series 6: chest w/o 3mm st cor · coronal · non-contrast · 0.61mm/px · 3 of 104 slices shown]
[im 21/104  lung]
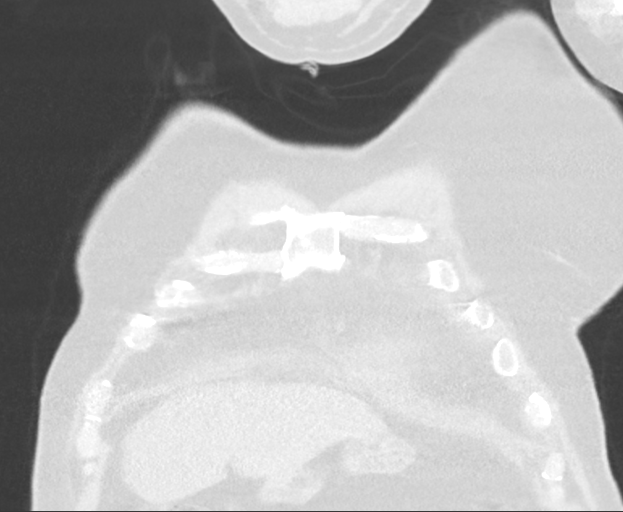
[im 42/104  lung]
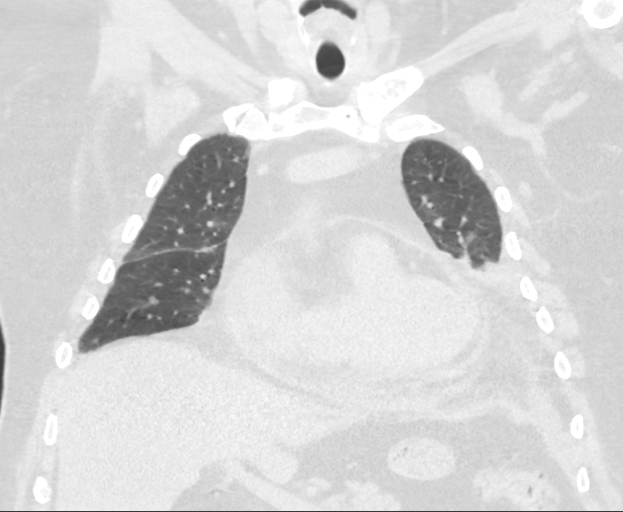
[im 62/104  lung]
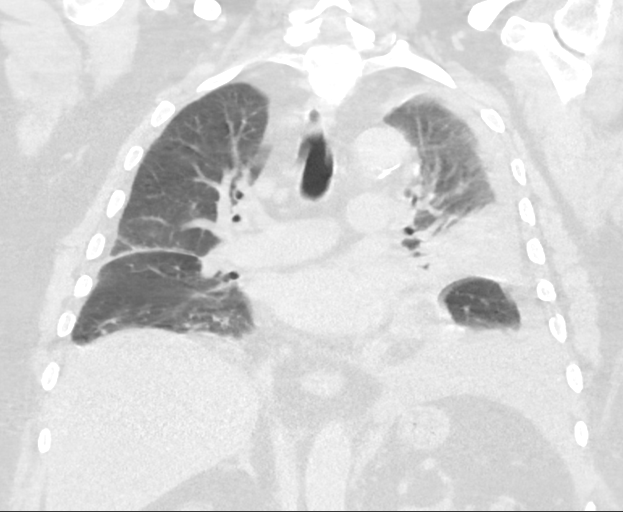

[15 of 36 positions shown; findings below may reference images not displayed]

FINDINGS: Cardiovascular: Limited evaluation without intravenous contrast.
Mild aortic atherosclerosis. No aneurysmal dilatation. Mild coronary
vascular calcification. Borderline to mild cardiomegaly. Small
pericardial effusion measuring up to 8 mm in maximum thickness
anteriorly.

Mediastinum/Nodes: Midline trachea. No thyroid mass. Borderline
lymph nodes measuring up to 1 cm, mildly decreased in size compared
to prior chest CT. Esophagus within normal limits.

Lungs/Pleura: Small moderate bilateral pleural effusions, increased
compared with prior CT. Consolidation within the lingula and left
lower lobe as well as the right lower lobe. Scattered foci of hazy
density in the upper lobes suggesting edema.

Upper Abdomen: Status post left nephrectomy with high positioning of
the spleen. No acute abnormality

Musculoskeletal: Degenerative changes.  No acute osseous abnormality
IMPRESSION: 1. Small to moderate bilateral pleural effusions, increased as
compared with CT 02/03/2018. Consolidations within the left greater
than right lower lobe, and lingula which may be secondary to
atelectasis or pneumonia.
2. Background hazy density in the left greater than right upper
lobes, possible edema. Small pericardial effusion.

Aortic Atherosclerosis ([id number]-XPN.N).

## 2020-04-08 NOTE — Progress Notes (Deleted)
Phone [phone] In person visit   Subjective:   Wayne Booth is a 79 y.o. year old very pleasant male patient who presents for/with See problem oriented charting No chief complaint on file.   This visit occurred during the SARS-CoV-2 public health emergency.  Safety protocols were in place, including screening questions prior to the visit, additional usage of staff PPE, and extensive cleaning of exam room while observing appropriate contact time as indicated for disinfecting solutions.   Past Medical History-  Patient Active Problem List   Diagnosis Date Noted  . 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 Pulmonary nodule 10/13/2019  . Pneumonia due to COVID-19 virus 04/20/2019  . PTSD (post-traumatic stress disorder) 07/27/2018  . Memory loss 06/10/2018  . Aortic atherosclerosis ([place]) 05/10/2018  . Chronic respiratory failure with hypoxia ([place]) 04/26/2018  . First degree AV block 04/15/2018  . Hyponatremia 04/15/2018  . Physical deconditioning 04/15/2018  . Pleural effusion 04/14/2018  . CAD (coronary artery disease) 04/09/2018  . Chronic pain 04/09/2018  . CKD (chronic kidney disease) stage 3, GFR 30-59 ml/min (H[facility])   . Dyspnea   . HCAP (healthcare-associated pneumonia) 02/03/2018  . Constipation 01/21/2018  . Chest pain   . Anticoagulated   . Atrial flutter ([place]) 11/01/2017  . Pain in left knee 07/15/2017  . Bilateral foot pain 07/13/2017  . Community acquired pneumonia 06/18/2017  . 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 VTE (venous thromboembolism) 06/09/2017  . History of adenomatous polyp of colon 04/12/2017  . Chronic pulmonary embolism ([place]) 10/23/2016  . Cervical disc disease 11/13/2015  . Essential tremor 08/14/2015  . Gout 08/14/2015  . Hypothyroidism 07/12/2014  . Hyperglycemia 07/12/2014  . GAD (generalized anxiety disorder) 11/07/2013  . OSA (obstructive sleep apnea) 07/07/2011  . Asthmatic bronchitis 06/09/2011  . Actinic keratosis  11/27/2009  . Osteoarthritis 11/13/2008  . History of prostate cancer-seed implantation 2010.  Dr. Alinda Money 10/02/2008  . Morbid obesity ([place]) 10/02/2008  . History of renal cell carcinoma 12/26/2007  . Insomnia 12/26/2007  . TESTOSTERONE DEFICIENCY 12/28/2006  . Essential hypertension 12/28/2006    Medications- reviewed and updated Current Outpatient Medications  Medication Sig Dispense Refill  . albuterol (PROVENTIL) (2.5 MG/3ML) 0.083% nebulizer solution Take 3 mLs (2.5 mg total) by nebulization every 6 (six) hours as needed for wheezing or shortness of breath. 75 mL 3  . albuterol (VENTOLIN HFA) 108 (90 Base) MCG/ACT inhaler Inhale 2 puffs into the lungs every 6 (six) hours as needed for wheezing or shortness of breath. 18 g 2  . Budeson-Glycopyrrol-Formoterol (BREZTRI AEROSPHERE) 160-9-4.8 MCG/ACT AERO Inhale 2 puffs into the lungs in the morning and at bedtime. 10.7 g 4  . Budeson-Glycopyrrol-Formoterol (BREZTRI AEROSPHERE) 160-9-4.8 MCG/ACT AERO Inhale 2 puffs into the lungs in the morning and at bedtime. 10.7 g 5  . Budeson-Glycopyrrol-Formoterol (BREZTRI AEROSPHERE) 160-9-4.8 MCG/ACT AERO Inhale 2 puffs into the lungs in the morning and at bedtime. 10.7 g 0  . Budeson-Glycopyrrol-Formoterol (BREZTRI AEROSPHERE) 160-9-4.8 MCG/ACT AERO Inhale 2 puffs into the lungs in the morning and at bedtime. 10.7 g 6  . busPIRone (BUSPAR) 5 MG tablet TAKE ONE TABLET BY MOUTH TWICE A DAY AS NEEDED FOR ANXIETY 90 tablet 2  . DULoxetine (CYMBALTA) 30 MG capsule Take 1 capsule (30 mg total) by mouth daily. 30 capsule 3  . ELIQUIS 5 MG TABS tablet TAKE ONE TABLET BY MOUTH TWICE A DAY 60 tablet 10  . escitalopram (LEXAPRO) 20 MG tablet Take 1 tablet (20 mg total) by mouth  daily. 90 tablet 1  . furosemide (LASIX) 40 MG tablet TAKE 1-2 TABELTS BY MOUTH TWO TIMES A DAY 120 tablet 4  . gabapentin (NEURONTIN) 100 MG capsule Take 3 capsules (300 mg total) by mouth at bedtime. 90 capsule 5  . KLOR-CON M20 20 MEQ  tablet TAKE ONE TABLET BY MOUTH TWICE A DAY 60 tablet 2  . levothyroxine (SYNTHROID) 150 MCG tablet TAKE ONE TABLET BY MOUTH DAILY 90 tablet 0  . meloxicam (MOBIC) 7.5 MG tablet Take 1 tablet (7.5 mg total) by mouth daily. 30 tablet 0  . metFORMIN (GLUCOPHAGE) 500 MG tablet Take 1 tablet (500 mg total) by mouth daily with breakfast. 90 tablet 3  . montelukast (SINGULAIR) 10 MG tablet Take 1 tablet (10 mg total) by mouth daily. 30 tablet 11  . Multiple Vitamins-Minerals (MULTIVITAMINS THER. W/MINERALS) TABS Take 1 tablet by mouth daily.    . nortriptyline (PAMELOR) 50 MG capsule Take 1 capsule (50 mg total) by mouth at bedtime. 90 capsule 1  . predniSONE (DELTASONE) 10 MG tablet 4 tabs for 2 days, then 3 tabs for 2 days, 2 tabs for 2 days, then 1 tab for 2 days, then stop 20 tablet 0  . pregabalin (LYRICA) 75 MG capsule Take 1 capsule (75 mg total) by mouth 2 (two) times daily. 60 capsule 1  . propranolol (INDERAL) 10 MG tablet TAKE ONE TABLET BY MOUTH TWICE A DAY 120 tablet 0  . Spacer/Aero-Holding Chambers (AEROCHAMBER MV) inhaler Use as instructed 1 each 0  . traMADol (ULTRAM) 50 MG tablet TAKE 1 TABLET BY MOUTH EVERY 12 HOURS AS NEEDED FOR MODERATE PAIN 30 tablet 0  . traZODone (DESYREL) 50 MG tablet TAKE ONE-HALF TO 1 FULL TABLET AT BEDTIME AS NEEDED FOR SLEEP 90 tablet 2   No current facility-administered medications for this visit.     Objective:  There were no vitals taken for this visit. Gen: NAD, resting comfortably CV: RRR no murmurs rubs or gallops Lungs: CTAB no crackles, wheeze, rhonchi Abdomen: soft/nontender/nondistended/normal bowel sounds. No rebound or guarding.  Ext: no edema Skin: warm, dry Neuro: grossly normal, moves all extremities  ***    Assessment and Plan  No Energy S:***  A/P: ***     ***04/20/2019 covid ***04/14/2019 awv *** overdue CPE  #Heart failure with *** ejection fraction # Chronic respiratory failure- continues on 02 at night after covid  19- no daytime oxgyen- also could be related to asthmatic bronchitis on betrizi instead of symbicort*** followed by pulmonology Dr. Elsworth Soho. ***referred to pulmonary rehab. Also has OSA on cpap S: Medication:*** furosemide 40mg  BID.  Potassium twice daily  Edema: *** Weight gain:*** Shortness of breath: *** Orthopnea/PND: ***  A/P: CHF-***  Chronic respiratory failure- ***   # Atrial fibrillation #Chronic PE- history PE following hand surgery  In past S: Rate controlled with *** propranolol Anticoagulated with *** eliquis 5 mg BID Chadsvasc score of *** Patient is *** followed by cardiology: ***  A/P: ***   #Neuropathy- feet #Cervical disc disease- working to titrate down nortriptyline due to serotonin syndrome risk. Also on gabapentin S: Last visit patient was complaining of burning in his feet particularly when they swell and at times even when not swelling.  Seem to worsen will reduce nortriptyline. Podiatry started Lyrica. Already on gabapentin for neck issues. Pain at last visit was 5 out of 10 during the day and 8 out of 10 at night.  He also had tramadol available for pain which I recommended to  use sparingly due to risk of serotonin syndrome with other medications.  B12 has been normal. A1c in prediabetes range. T3 and T4 normal.  We have discussed possible neurology referral***  A/P: ***   # Depression # Generalized anxiety disorder  S: Medication:Lexapro 20Mg , nortriptyline 50mg  down from 75 mg- for cervical disc disease primarily, Buspirone 5Mg  once or twice a week.   Also on trazodone for sleep Depression screen [facility] 2/9 07/31/2019 06/21/2019 05/05/2019 Decreased Interest 0 0 3 Down, Depressed, Hopeless 0 0 0 PHQ - 2 Score 0 0 3 Altered sleeping 0 0 3 Tired, decreased energy 0 1 3 Change in appetite 0 0 3 Feeling bad or failure about yourself  0 0 0 Trouble concentrating 0 0 1 Moving slowly or fidgety/restless 0 0 0 Suicidal thoughts 0 0 - PHQ-9  Score 0 1 13 Difficult doing work/chores Not difficult at all Not difficult at all - Some recent data might be hidden  GAD 7 : Generalized Anxiety Score 12/16/2017 Nervous, Anxious, on Edge 1 Control/stop worrying 0 Worry too much - different things 0 Trouble relaxing 0 Restless 0 Easily annoyed or irritable 1 Afraid - awful might happen 1 Total GAD 7 Score 3 Anxiety Difficulty Not difficult at all A/P: ***  -cyclobenzaprine increases risk of serotonin syndrome *** -tramadol used only once or twice a week ***  # Hyperglycemia/insulin resistance/prediabetes S:  Medication: *** metformin 500mg  daily Exercise and diet- *** Lab Results Component Value Date  HGBA1C 6.3 07/31/2019  HGBA1C 5.9 05/10/2018  HGBA1C 6.3 12/16/2017  A/P: ***  # postoperative  Hypothyroidism- cannot use TSH S: compliant On thyroid medication-Levothyroxine 150Mg   ROS-No hair or nail changes. No heat/cold intolerance. No constipation or diarrhea. Denies shakiness or anxiety. *** A/P:***   #hypertension S: medication: *** proranolol 10mg  BID Home readings #s: *** BP Readings from Last 3 Encounters: 10/13/19 122/68 08/04/19 126/64 07/31/19 138/70 A/P: ***  #Chronic kidney disease stage III- unilateral kidney S: GFR is typically in the ***range -Patient knows to avoid NSAIDs***  A/P: ***   #Memory loss-intermittent issues with memory after multiple hospitalizations since 2019. MRI of the brain January 2020 largely unremarkable. B12 levels have been normal. Issues even when depression well controlled. -If worsens would plan on MMSE***   #Gout S: *** flares in *** on *** Lab Results Component Value Date  LABURIC 8.6 (H) 12/05/2017  A/P:***   #allergies- on singulair- discussed risks to mood on ***   No problem-specific Assessment & Plan notes found for this encounter.   Recommended follow up: ***No follow-ups on file. Future Appointments  Date Time Provider [place]   04/09/2020  8:00 AM Marin Olp, MD [facility]  05/02/2020  4:00 PM Parrett, Fonnie Mu, NP [facility] None    Lab/Order associations: No diagnosis found.  No orders of the defined types were placed in this encounter.   Time Spent: *** minutes of total time (12:32 PM***- 12:32 PM***) was spent on the date of the encounter performing the following actions: chart review prior to seeing the patient, obtaining history, performing a medically necessary exam, counseling on the treatment plan, placing orders, and documenting in our EHR.   Return precautions advised.  Clyde Lundborg, CMA

## 2020-04-09 ENCOUNTER — Telehealth: Payer: Self-pay | Admitting: Pulmonary Disease

## 2020-04-09 ENCOUNTER — Ambulatory Visit: Payer: Medicare Other | Admitting: Family Medicine

## 2020-04-09 DIAGNOSIS — J849 Interstitial pulmonary disease, unspecified: Secondary | ICD-10-CM

## 2020-04-09 DIAGNOSIS — J9611 Chronic respiratory failure with hypoxia: Secondary | ICD-10-CM

## 2020-04-09 NOTE — Telephone Encounter (Signed)
OK to start Restricitve lung disease , chronic hypoxic resp failure

## 2020-04-09 NOTE — Telephone Encounter (Signed)
Called and spoke with pt's wife Jody. Jody stated that pt is wanting to get started in pulmonary rehab again. Dr. Elsworth Soho, please advise if you are okay with [place] placing the referral.

## 2020-04-09 NOTE — Telephone Encounter (Signed)
Referral placed.  Pt's wife aware.  Nothing further needed at this time- will close encounter.

## 2020-04-16 ENCOUNTER — Telehealth (HOSPITAL_COMMUNITY): Payer: Self-pay

## 2020-04-16 NOTE — Telephone Encounter (Signed)
Patient called and was interested in participating in the Pulmonary Rehab Program. Patient will come in for orientation on 05/10/2020@1 :30pm and will attend the 1:30pm exercise class.  Mailed homework package.

## 2020-04-21 NOTE — Addendum Note (Signed)
Encounter addended by: Rowe Pavy, RN on: 04/21/2020 3:51 PM  Actions taken: Flowsheet accepted, Clinical Note Signed

## 2020-04-21 NOTE — Progress Notes (Signed)
Discharge Progress Report  Patient Details  Name: Wayne Booth MRN: [id number] Date of Birth: Aug 05, 1940 Referring Provider:     Pulmonary Rehab Walk Test from 12/13/2019 in [place]  Referring Provider Dr. Elsworth Soho       Number of Visits: 5 Reason for Discharge:      Early Exit:  competing medical issues  Smoking History:  Social History   Tobacco Use  Smoking Status Never Smoker  Smokeless Tobacco Never Used    Diagnosis:  Interstitial lung disease ([place])  ADL [facility]:  Pulmonary Assessment Scores    Row Name 12/13/19 1158 12/19/19 1501       ADL [facility]   ADL Phase Entry Entry    SOB Score total -- 102      CAT Score   CAT Score -- 28      mMRC Score   mMRC Score 3 --           Initial Exercise Prescription:  Initial Exercise Prescription - 12/13/19 1200      Date of Initial Exercise RX and Referring Provider   Date 12/13/19    Referring Provider Dr. Elsworth Soho      Oxygen   Oxygen Continuous    Liters 2      T5 Nustep   Level 2    SPM 80    Minutes 30      Prescription Details   Frequency (times per week) 2    Duration Progress to 30 minutes of continuous aerobic without signs/symptoms of physical distress      Intensity   THRR 40-80% of Max Heartrate 57-114    Ratings of Perceived Exertion 11-13    Perceived Dyspnea 0-4      Progression   Progression Continue progressive overload as per policy without signs/symptoms or physical distress.      Resistance Training   Training Prescription Yes    Weight blue bands    Reps 10-15           Discharge Exercise Prescription (Final Exercise Prescription Changes):  Exercise Prescription Changes - 01/02/20 1503      Response to Exercise   Blood Pressure (Admit) 122/60    Blood Pressure (Exit) 128/60    Heart Rate (Admit) 90 bpm    Heart Rate (Exercise) 103 bpm    Heart Rate (Exit) 91 bpm    Oxygen Saturation (Admit) 98 %    Oxygen Saturation (Exercise) 94 %     Oxygen Saturation (Exit) 97 %    Rating of Perceived Exertion (Exercise) 13    Perceived Dyspnea (Exercise) 2    Duration Continue with 30 min of aerobic exercise without signs/symptoms of physical distress.    Intensity THRR unchanged      Progression   Progression Continue to progress workloads to maintain intensity without signs/symptoms of physical distress.      Resistance Training   Training Prescription Yes    Weight blue bands    Reps 10-15    Time 10 Minutes      Oxygen   Oxygen Continuous    Liters 2      T5 Nustep   Level 2    SPM 80    Minutes 30    METs 1.9           Functional Capacity:  6 Minute Walk    Row Name 12/13/19 1158         6 Minute Walk   Phase Initial  Distance 400 feet     Walk Time 6 minutes     # of Rest Breaks 0     MPH 0.75     METS 0.28     RPE 15     Perceived Dyspnea  3     VO2 Peak 1     Symptoms Yes (comment)     Comments Chest tightness with exertion and resolved with rest. Took seated breaks due to SOB.     Resting HR 90 bpm     Resting BP 110/60     Resting Oxygen Saturation  94 %     Exercise Oxygen Saturation  during 6 min walk 86 %     Max Ex. HR 100 bpm     Max Ex. BP 146/80     2 Minute Post BP 122/70       Interval HR   1 Minute HR 90     2 Minute HR 98     3 Minute HR 93     4 Minute HR 91     5 Minute HR 97     6 Minute HR 100     2 Minute Post HR 86     Interval Heart Rate? Yes       Interval Oxygen   Interval Oxygen? Yes     Baseline Oxygen Saturation % 94 %     1 Minute Oxygen Saturation % 86 %     1 Minute Liters of Oxygen 0 L     2 Minute Oxygen Saturation % 88 %     2 Minute Liters of Oxygen 2 L     3 Minute Oxygen Saturation % 93 %     3 Minute Liters of Oxygen 2 L     4 Minute Oxygen Saturation % 97 %     4 Minute Liters of Oxygen 2 L     5 Minute Oxygen Saturation % 95 %     5 Minute Liters of Oxygen 2 L     6 Minute Oxygen Saturation % 89 %     6 Minute Liters of Oxygen 2 L      2 Minute Post Oxygen Saturation % 97 %     2 Minute Post Liters of Oxygen 2 L            Psychological, QOL, Others - Outcomes: PHQ 2/9: Depression screen [facility] 2/9 12/13/2019 07/31/2019 06/21/2019 05/05/2019 04/14/2019  Decreased Interest 0 0 0 3 0  Down, Depressed, Hopeless 0 0 0 0 0  PHQ - 2 Score 0 0 0 3 0  Altered sleeping 0 0 0 3 -  Tired, decreased energy 0 0 1 3 -  Change in appetite 0 0 0 3 -  Feeling bad or failure about yourself  0 0 0 0 -  Trouble concentrating 0 0 0 1 -  Moving slowly or fidgety/restless 0 0 0 0 -  Suicidal thoughts 0 0 0 - -  PHQ-9 Score 0 0 1 13 -  Difficult doing work/chores Not difficult at all Not difficult at all Not difficult at all - -  Some recent data might be hidden    Quality of Life:   Personal Goals: Goals established at orientation with interventions provided to work toward goal.  Personal Goals and Risk Factors at Admission - 12/13/19 1010      Core Components/Risk Factors/Patient Goals on Admission   Improve shortness of breath with ADL's Yes  Intervention Provide education, individualized exercise plan and daily activity instruction to help decrease symptoms of SOB with activities of daily living.    Expected Outcomes Short Term: Improve cardiorespiratory fitness to achieve a reduction of symptoms when performing ADLs;Long Term: Be able to perform more ADLs without symptoms or delay the onset of symptoms            Personal Goals Discharge:  Goals and Risk Factor Review    Row Name 12/13/19 1023 01/09/20 0859 03/18/20 1606 04/21/20 1536       Core Components/Risk Factors/Patient Goals Review   Personal Goals Review Increase knowledge of respiratory medications and ability to use respiratory devices properly.;Improve shortness of breath with ADL's;Develop more efficient breathing techniques such as purse lipped breathing and diaphragmatic breathing and practicing self-pacing with activity. Increase knowledge of respiratory  medications and ability to use respiratory devices properly.;Improve shortness of breath with ADL's;Develop more efficient breathing techniques such as purse lipped breathing and diaphragmatic breathing and practicing self-pacing with activity. Increase knowledge of respiratory medications and ability to use respiratory devices properly.;Improve shortness of breath with ADL's;Develop more efficient breathing techniques such as purse lipped breathing and diaphragmatic breathing and practicing self-pacing with activity. --    Review -- [name] was extremely deconditioned when starting the program, he is making progress albeit slow, he is able to exercise for 30 minutes now on level 2 of the nustep. Pt completed 4 exercise sessions Pt discharged with the completion of 5 exercise sessions due to competing medical issues.  Plan to return when able.    Expected Outcomes -- See admission goals. Unable to evaulate due to short period of time to participate. --           Exercise Goals and Review:  Exercise Goals    Row Name 12/13/19 1208 01/09/20 0751           Exercise Goals   Increase Physical Activity Yes Yes      Intervention Provide advice, education, support and counseling about physical activity/exercise needs.;Develop an individualized exercise prescription for aerobic and resistive training based on initial evaluation findings, risk stratification, comorbidities and participant's personal goals. Provide advice, education, support and counseling about physical activity/exercise needs.;Develop an individualized exercise prescription for aerobic and resistive training based on initial evaluation findings, risk stratification, comorbidities and participant's personal goals.      Expected Outcomes Short Term: Attend rehab on a regular basis to increase amount of physical activity.;Long Term: Add in home exercise to make exercise part of routine and to increase amount of physical activity.;Long Term:  Exercising regularly at least 3-5 days a week. Short Term: Attend rehab on a regular basis to increase amount of physical activity.;Long Term: Add in home exercise to make exercise part of routine and to increase amount of physical activity.;Long Term: Exercising regularly at least 3-5 days a week.      Increase Strength and Stamina Yes Yes      Intervention Provide advice, education, support and counseling about physical activity/exercise needs.;Develop an individualized exercise prescription for aerobic and resistive training based on initial evaluation findings, risk stratification, comorbidities and participant's personal goals. Provide advice, education, support and counseling about physical activity/exercise needs.;Develop an individualized exercise prescription for aerobic and resistive training based on initial evaluation findings, risk stratification, comorbidities and participant's personal goals.      Expected Outcomes Short Term: Increase workloads from initial exercise prescription for resistance, speed, and METs.;Short Term: Perform resistance training exercises routinely during rehab and add  in resistance training at home;Long Term: Improve cardiorespiratory fitness, muscular endurance and strength as measured by increased METs and functional capacity (6MWT) Short Term: Increase workloads from initial exercise prescription for resistance, speed, and METs.;Short Term: Perform resistance training exercises routinely during rehab and add in resistance training at home;Long Term: Improve cardiorespiratory fitness, muscular endurance and strength as measured by increased METs and functional capacity (6MWT)      Able to understand and use rate of perceived exertion (RPE) scale Yes Yes      Intervention Provide education and explanation on how to use RPE scale Provide education and explanation on how to use RPE scale      Expected Outcomes Short Term: Able to use RPE daily in rehab to express  subjective intensity level;Long Term:  Able to use RPE to guide intensity level when exercising independently Short Term: Able to use RPE daily in rehab to express subjective intensity level;Long Term:  Able to use RPE to guide intensity level when exercising independently      Able to understand and use Dyspnea scale Yes Yes      Intervention Provide education and explanation on how to use Dyspnea scale Provide education and explanation on how to use Dyspnea scale      Expected Outcomes Short Term: Able to use Dyspnea scale daily in rehab to express subjective sense of shortness of breath during exertion;Long Term: Able to use Dyspnea scale to guide intensity level when exercising independently Short Term: Able to use Dyspnea scale daily in rehab to express subjective sense of shortness of breath during exertion;Long Term: Able to use Dyspnea scale to guide intensity level when exercising independently      Knowledge and understanding of Target Heart Rate Range (THRR) Yes Yes      Intervention Provide education and explanation of THRR including how the numbers were predicted and where they are located for reference Provide education and explanation of THRR including how the numbers were predicted and where they are located for reference      Expected Outcomes Short Term: Able to state/look up THRR;Short Term: Able to use daily as guideline for intensity in rehab;Long Term: Able to use THRR to govern intensity when exercising independently Short Term: Able to state/look up THRR;Short Term: Able to use daily as guideline for intensity in rehab;Long Term: Able to use THRR to govern intensity when exercising independently      Understanding of Exercise Prescription Yes Yes      Intervention Provide education, explanation, and written materials on patient's individual exercise prescription Provide education, explanation, and written materials on patient's individual exercise prescription      Expected Outcomes  Short Term: Able to explain program exercise prescription;Long Term: Able to explain home exercise prescription to exercise independently Short Term: Able to explain program exercise prescription;Long Term: Able to explain home exercise prescription to exercise independently             Exercise Goals Re-Evaluation:  Exercise Goals Re-Evaluation    Row Name 01/09/20 0751             Exercise Goal Re-Evaluation   Exercise Goals Review Increase Physical Activity;Increase Strength and Stamina;Able to understand and use rate of perceived exertion (RPE) scale;Able to understand and use Dyspnea scale;Knowledge and understanding of Target Heart Rate Range (THRR);Understanding of Exercise Prescription       Comments Pt has completed 5 exercise sessions. He has been able to increase his workloads and his stamina is increasing. He  was severely deconditioned when he entered the program. He currently exercises at 1.9 METs on the stepper. Will continue to monitor and progress as able.       Expected Outcomes Through exercise at rehab and at home, the patient will decrease shortness of breath with daily activities and feel confident in carrying out an exercise regime at home.              Nutrition & Weight - Outcomes:  Pre Biometrics - 12/13/19 0936      Pre Biometrics   Height 5\' 11"  (1.803 m)    Weight 131.5 kg    BMI (Calculated) 40.45    Grip Strength 32 kg            Nutrition:  Nutrition Therapy & Goals - 01/10/20 1026      Nutrition Therapy   Diet Carb modified      Personal Nutrition Goals   Nutrition Goal Pt to check CBGs at home to maintain or reduce A1C      Intervention Plan   Intervention Prescribe, educate and counsel regarding individualized specific dietary modifications aiming towards targeted core components such as weight, hypertension, lipid management, diabetes, heart failure and other comorbidities.    Expected Outcomes Short Term Goal: Understand basic  principles of dietary content, such as calories, fat, sodium, cholesterol and nutrients.           Nutrition Discharge:   Education Questionnaire Score:  Knowledge Questionnaire Score - 12/19/19 1501      Knowledge Questionnaire Score   Pre Score 14/18           Goals reviewed with patient; copy given to patient.

## 2020-04-29 ENCOUNTER — Encounter: Payer: Self-pay | Admitting: Podiatry

## 2020-04-29 ENCOUNTER — Ambulatory Visit (INDEPENDENT_AMBULATORY_CARE_PROVIDER_SITE_OTHER): Payer: Medicare Other | Admitting: Podiatry

## 2020-04-29 ENCOUNTER — Other Ambulatory Visit: Payer: Self-pay

## 2020-04-29 DIAGNOSIS — B351 Tinea unguium: Secondary | ICD-10-CM

## 2020-04-29 DIAGNOSIS — M792 Neuralgia and neuritis, unspecified: Secondary | ICD-10-CM | POA: Diagnosis not present

## 2020-04-29 DIAGNOSIS — M79675 Pain in left toe(s): Secondary | ICD-10-CM | POA: Diagnosis not present

## 2020-04-29 DIAGNOSIS — M79672 Pain in left foot: Secondary | ICD-10-CM | POA: Diagnosis not present

## 2020-04-29 DIAGNOSIS — M79671 Pain in right foot: Secondary | ICD-10-CM | POA: Diagnosis not present

## 2020-04-29 DIAGNOSIS — M79674 Pain in right toe(s): Secondary | ICD-10-CM | POA: Diagnosis not present

## 2020-04-29 MED ORDER — CAPSAICIN 0.1 % EX CREA: TOPICAL_CREAM | CUTANEOUS | 0 refills | Status: DC

## 2020-04-29 NOTE — Progress Notes (Signed)
Subjective:  Patient ID: Wayne Booth, male    DOB: 1940-12-04,  MRN: [id number]  79 y.o. male presents with with chief concern of burning pain in feet b/l for several months and painful thick toenails that are difficult to trim. Pain interferes with ambulation. Aggravating factors include wearing enclosed shoe gear. Pain is relieved with periodic professional debridement.   He states he continues to have burning on the plantar aspect of both feet. Symptoms occur at night. He denies any lower back problems/injuries. He has tried gabapentin and pregabalin with no relief. He does have diagnosis in chart of chronic pain.  Review of Systems: Negative except as noted in the HPI.  Past Medical History:  Diagnosis Date  . Allergy    States his nose runs when he is around grease.   Marland Kitchen Anxiety   . Arthritis   . Atrial fibrillation ([place])   . Cancer ([place])   . Coronary artery disease   . History of total knee replacement   . Hypertension   . Hypothyroidism   . 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 Kidney stone   . OSA (obstructive sleep apnea)    pt does not wear cpap at night  . Pituitary cyst ([place])   . Pneumonia    hx of  . Prostate cancer [facility])    Past Surgical History:  Procedure Laterality Date  . ABLATION OF DYSRHYTHMIC FOCUS  01/28/2018  . ANTERIOR CERVICAL DECOMP/DISCECTOMY FUSION N/A 08/29/2012   Procedure: ANTERIOR CERVICAL DECOMPRESSION/DISCECTOMY FUSION 1 LEVEL;  Surgeon: Eustace Moore, MD;  Location: [place] NEURO ORS;  Service: Neurosurgery;  Laterality: N/A;  . APPENDECTOMY    . ATRIAL FIBRILLATION ABLATION N/A 01/28/2018   Procedure: ATRIAL FIBRILLATION ABLATION;  Surgeon: Constance Haw, MD;  Location: [place] CV LAB;  Service: Cardiovascular;  Laterality: N/A;  . CARDIAC CATHETERIZATION  2008   clean  . CHOLECYSTECTOMY    . COLONOSCOPY W/ POLYPECTOMY    . CRANIOTOMY N/A 11/08/2012   Procedure: CRANIOTOMY HYPOPHYSECTOMY  TRANSNASAL APPROACH;  Surgeon: Faythe Ghee, MD;  Location: [place] NEURO ORS;  Service: Neurosurgery;  Laterality: N/A;  Transphenoidal Resection of Pituitary Tumor  . FINGER SURGERY Left 2017   Dr Amedeo Plenty  . INSERTION PROSTATE RADIATION SEED    . IR THORACENTESIS ASP PLEURAL SPACE W/IMG GUIDE  04/15/2018  . KIDNEY STONE SURGERY    . KNEE ARTHROSCOPY  2007   left  . NEPHRECTOMY Left   . POSTERIOR CERVICAL LAMINECTOMY Left 04/24/2015   Procedure: Foraminotomy cervical five - cervical six cervical six - cervical seven left;  Surgeon: Eustace Moore, MD;  Location: [facility] NEUR[facility];  Service: Neurosurgery;  Laterality: Left;  . REPLACEMENT TOTAL KNEE BILATERAL    . RIGHT/LEFT HEART CATH AND CORONARY ANGIOGRAPHY N/A 07/14/2019   Procedure: RIGHT/LEFT HEART CATH AND CORONARY ANGIOGRAPHY;  Surgeon: Jettie Booze, MD;  Location: [place] CV LAB;  Service: Cardiovascular;  Laterality: N/A;   Patient Active Problem List   Diagnosis Date Noted  . 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 Pulmonary nodule 10/13/2019  . Pneumonia due to COVID-19 virus 04/20/2019  . PTSD (post-traumatic stress disorder) 07/27/2018  . Memory loss 06/10/2018  . Aortic atherosclerosis ([place]) 05/10/2018  . Chronic respiratory failure with hypoxia ([place]) 04/26/2018  . First degree AV block 04/15/2018  . Hyponatremia 04/15/2018  . Physical deconditioning 04/15/2018  . Pleural effusion 04/14/2018  . CAD (coronary artery disease) 04/09/2018  .  Chronic pain 04/09/2018  . CKD (chronic kidney disease) stage 3, GFR 30-59 ml/min (HCC)   . Dyspnea   . HCAP (healthcare-associated pneumonia) 02/03/2018  . Constipation 01/21/2018  . Chest pain   . Anticoagulated   . Atrial flutter ([place]) 11/01/2017  . Pain in left knee 07/15/2017  . Bilateral foot pain 07/13/2017  . Community acquired pneumonia 06/18/2017  . 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 VTE (venous thromboembolism) 06/09/2017  . History of  adenomatous polyp of colon 04/12/2017  . Chronic pulmonary embolism ([place]) 10/23/2016  . Cervical disc disease 11/13/2015  . Essential tremor 08/14/2015  . Gout 08/14/2015  . Hypothyroidism 07/12/2014  . Hyperglycemia 07/12/2014  . GAD (generalized anxiety disorder) 11/07/2013  . OSA (obstructive sleep apnea) 07/07/2011  . Asthmatic bronchitis 06/09/2011  . Actinic keratosis 11/27/2009  . Osteoarthritis 11/13/2008  . History of prostate cancer-seed implantation 2010.  Dr. Alinda Money 10/02/2008  . Morbid obesity ([place]) 10/02/2008  . History of renal cell carcinoma 12/26/2007  . Insomnia 12/26/2007  . TESTOSTERONE DEFICIENCY 12/28/2006  . Essential hypertension 12/28/2006    Current Outpatient Medications:  .  albuterol (PROVENTIL) (2.5 MG/3ML) 0.083% nebulizer solution, Take 3 mLs (2.5 mg total) by nebulization every 6 (six) hours as needed for wheezing or shortness of breath., Disp: 75 mL, Rfl: 3 .  albuterol (VENTOLIN HFA) 108 (90 Base) MCG/ACT inhaler, Inhale 2 puffs into the lungs every 6 (six) hours as needed for wheezing or shortness of breath., Disp: 18 g, Rfl: 2 .  Budeson-Glycopyrrol-Formoterol (BREZTRI AEROSPHERE) 160-9-4.8 MCG/ACT AERO, Inhale 2 puffs into the lungs in the morning and at bedtime., Disp: 10.7 g, Rfl: 4 .  Budeson-Glycopyrrol-Formoterol (BREZTRI AEROSPHERE) 160-9-4.8 MCG/ACT AERO, Inhale 2 puffs into the lungs in the morning and at bedtime., Disp: 10.7 g, Rfl: 5 .  Budeson-Glycopyrrol-Formoterol (BREZTRI AEROSPHERE) 160-9-4.8 MCG/ACT AERO, Inhale 2 puffs into the lungs in the morning and at bedtime., Disp: 10.7 g, Rfl: 0 .  Budeson-Glycopyrrol-Formoterol (BREZTRI AEROSPHERE) 160-9-4.8 MCG/ACT AERO, Inhale 2 puffs into the lungs in the morning and at bedtime., Disp: 10.7 g, Rfl: 6 .  busPIRone (BUSPAR) 5 MG tablet, TAKE ONE TABLET BY MOUTH TWICE A DAY AS NEEDED FOR ANXIETY, Disp: 90 tablet, Rfl: 2 .  DULoxetine (CYMBALTA) 30 MG capsule, Take 1 capsule (30 mg total) by  mouth daily., Disp: 30 capsule, Rfl: 3 .  ELIQUIS 5 MG TABS tablet, TAKE ONE TABLET BY MOUTH TWICE A DAY, Disp: 60 tablet, Rfl: 10 .  escitalopram (LEXAPRO) 20 MG tablet, Take 1 tablet (20 mg total) by mouth daily., Disp: 90 tablet, Rfl: 1 .  furosemide (LASIX) 40 MG tablet, TAKE 1-2 TABELTS BY MOUTH TWO TIMES A DAY, Disp: 120 tablet, Rfl: 4 .  gabapentin (NEURONTIN) 100 MG capsule, Take 3 capsules (300 mg total) by mouth at bedtime., Disp: 90 capsule, Rfl: 5 .  KLOR-CON M20 20 MEQ tablet, TAKE ONE TABLET BY MOUTH TWICE A DAY, Disp: 60 tablet, Rfl: 2 .  levothyroxine (SYNTHROID) 150 MCG tablet, TAKE ONE TABLET BY MOUTH DAILY, Disp: 90 tablet, Rfl: 0 .  meloxicam (MOBIC) 7.5 MG tablet, Take 1 tablet (7.5 mg total) by mouth daily., Disp: 30 tablet, Rfl: 0 .  metFORMIN (GLUCOPHAGE) 500 MG tablet, Take 1 tablet (500 mg total) by mouth daily with breakfast., Disp: 90 tablet, Rfl: 3 .  montelukast (SINGULAIR) 10 MG tablet, Take 1 tablet (10 mg total) by mouth daily., Disp: 30 tablet, Rfl: 11 .  Multiple Vitamins-Minerals (MULTIVITAMINS THER. W/MINERALS) TABS, Take  1 tablet by mouth daily., Disp: , Rfl:  .  nortriptyline (PAMELOR) 50 MG capsule, Take 1 capsule (50 mg total) by mouth at bedtime., Disp: 90 capsule, Rfl: 1 .  predniSONE (DELTASONE) 10 MG tablet, 4 tabs for 2 days, then 3 tabs for 2 days, 2 tabs for 2 days, then 1 tab for 2 days, then stop, Disp: 20 tablet, Rfl: 0 .  pregabalin (LYRICA) 75 MG capsule, Take 1 capsule (75 mg total) by mouth 2 (two) times daily., Disp: 60 capsule, Rfl: 1 .  propranolol (INDERAL) 10 MG tablet, TAKE ONE TABLET BY MOUTH TWICE A DAY, Disp: 120 tablet, Rfl: 0 .  Spacer/Aero-Holding Chambers (AEROCHAMBER MV) inhaler, Use as instructed, Disp: 1 each, Rfl: 0 .  traMADol (ULTRAM) 50 MG tablet, TAKE 1 TABLET BY MOUTH EVERY 12 HOURS AS NEEDED FOR MODERATE PAIN, Disp: 30 tablet, Rfl: 2 .  traZODone (DESYREL) 50 MG tablet, TAKE ONE-HALF TO 1 FULL TABLET AT BEDTIME AS NEEDED  FOR SLEEP, Disp: 90 tablet, Rfl: 2 .  Capsaicin 0.1 % CREA, Use gloves to apply to both feet according to package instructions for foot pain., Disp: 56.6 g, Rfl: 0 Allergies  Allergen Reactions  . Ambien [Zolpidem] Anxiety    Anxiety and agitation when tried as sleeper in hospital aug 2019   Social History   Occupational History  . Occupation: retired Armed forces operational officer: RETIRED  Tobacco Use  . Smoking status: Never Smoker  . Smokeless tobacco: Never Used  Vaping Use  . Vaping Use: Never used  Substance and Sexual Activity  . Alcohol use: Yes    Alcohol/week: 2.0 standard drinks    Types: 2 Shots of liquor per week    Comment: socially  . Drug use: No  . Sexual activity: Not on file    Objective:   Constitutional Pt is a pleasant 79 y.o. Caucasian male morbidly obese in NAD. AAO x 3.   Vascular Capillary fill time to digits <3 seconds b/l lower extremities. Palpable pedal pulses b/l LE. Pedal hair absent. Lower extremity skin temperature gradient within normal limits. No ischemia or gangrene noted b/l lower extremities. No cyanosis or clubbing noted.  Neurologic Normal speech. Pt has subjective symptoms of neuropathy. Protective sensation decreased with 10 gram monofilament b/l. Clonus negative b/l.  Dermatologic Pedal skin with normal turgor, texture and tone bilaterally. No open wounds bilaterally. No interdigital macerations bilaterally. Toenails 1-5 b/l elongated, discolored, dystrophic, thickened, crumbly with subungual debris and tenderness to dorsal palpation. No hyperkeratotic nor porokeratotic lesions present on today's visit.  Orthopedic: Normal muscle strength 5/5 to all lower extremity muscle groups bilaterally. No pain crepitus or joint limitation noted with ROM b/l.   Radiographs: None Assessment:   1. Pain due to onychomycosis of toenails of both feet   2. Foot pain, bilateral   3. Neuropathic pain    Plan:  Patient was evaluated and treated  and all questions answered.  Onychomycosis with pain -Nails palliatively debridement as below. -Educated on self-care  Procedure: Nail Debridement Rationale: Pain Type of Debridement: manual, sharp debridement. Instrumentation: Nail nipper, rotary burr. Number of Nails: 10  -Examined patient. -Toenails 1-5 b/l were debrided in length and girth with sterile nail nippers and dremel without iatrogenic bleeding.  -Patient to report any pedal injuries to medical professional immediately. -Discussed neuropathic pain with patient and son. Patient has tried gabapentin and pregabalin with no improvement in symptoms of burning plantar aspect of both feet. Start Capsaicin Cream  to feet according to package instructions. Apply with gloves and wash hands well after application.  Refer to Neurology for bilateral neuropathic pain in feet described as burning at night. -Patient to continue soft, supportive shoe gear daily. -Patient/POA to call should there be question/concern in the interim.  Return in about 3 months (around 07/30/2020) for 3 month toenail debridement.  Marzetta Board, DPM

## 2020-04-30 ENCOUNTER — Other Ambulatory Visit: Payer: Self-pay | Admitting: Podiatry

## 2020-04-30 NOTE — Telephone Encounter (Signed)
Please advise 

## 2020-05-02 ENCOUNTER — Ambulatory Visit: Payer: Medicare Other | Admitting: Adult Health

## 2020-05-03 ENCOUNTER — Other Ambulatory Visit: Payer: Self-pay | Admitting: Family Medicine

## 2020-05-04 DIAGNOSIS — G4733 Obstructive sleep apnea (adult) (pediatric): Secondary | ICD-10-CM | POA: Diagnosis not present

## 2020-05-07 ENCOUNTER — Telehealth (HOSPITAL_COMMUNITY): Payer: Self-pay

## 2020-05-07 NOTE — Telephone Encounter (Signed)
Called pt to remind him of his upcoming appt with Pulmonary Rehab. LMTCB for confirmation.

## 2020-05-10 ENCOUNTER — Encounter (HOSPITAL_COMMUNITY)
Admission: RE | Admit: 2020-05-10 | Discharge: 2020-05-10 | Disposition: A | Discharge status: Home / Self Care | Payer: Medicare Other | Source: Ambulatory Visit | Attending: Pulmonary Disease | Admitting: Pulmonary Disease

## 2020-05-10 ENCOUNTER — Other Ambulatory Visit: Payer: Self-pay

## 2020-05-10 ENCOUNTER — Encounter (HOSPITAL_COMMUNITY): Payer: Self-pay

## 2020-05-10 VITALS — BP 122/68 | HR 90 | Ht 67.0 in | Wt 280.0 lb

## 2020-05-10 DIAGNOSIS — Z79899 Other long term (current) drug therapy: Secondary | ICD-10-CM | POA: Diagnosis not present

## 2020-05-10 DIAGNOSIS — J849 Interstitial pulmonary disease, unspecified: Secondary | ICD-10-CM | POA: Diagnosis not present

## 2020-05-10 DIAGNOSIS — Z8616 Personal history of COVID-19: Secondary | ICD-10-CM | POA: Insufficient documentation

## 2020-05-10 NOTE — Progress Notes (Signed)
Wayne Booth 79 y.o. male Pulmonary Rehab Physical Assessment Orientation Note Physical assessment reveals heart rate is normal, breath sounds diminished throughout with characteristic for ILD crackles in the RLL.  Anterior breath sounds diminished but clear.  Difficult for Wayne Booth to take a deep breath in due to body hiatus/large abdominal area.  Grip strength equal, strong. Distal pulses palpable and legs are tight and hard.  Lower legs discolored per pt for many years.  Skin is very dry and there are scratches with old blood/scabs. Patient reports he  does take medications as prescribed mostly per his wife there are some medications such as singular he only takes when he is short of breath.  Bowel sounds + in all four quadrants with no complain of nausea, vomiting or diarrhea. Reviewed with pt how to use the albuterol nebulizer.  Pt had the medication but did not know how to set it up. Pt and wife able to return demonstration. Wayne Booth, BSN Cardiac and Training and development officer

## 2020-05-10 NOTE — Progress Notes (Signed)
Pulmonary Individual Treatment Plan  Patient Details  Name: Wayne Booth MRN: [id number] Date of Birth: 1940-10-21 Referring Provider:     Pulmonary Rehab Walk Test from 05/10/2020 in [place]  Referring Provider Dr. Elsworth Soho      Initial Encounter Date:    Pulmonary Rehab Walk Test from 05/10/2020 in [place]  Date 05/10/20      Visit Diagnosis: ILD (interstitial lung disease) ([place])  Patient's Home Medications on Admission:   Current Outpatient Medications:  .  albuterol (PROVENTIL) (2.5 MG/3ML) 0.083% nebulizer solution, Take 3 mLs (2.5 mg total) by nebulization every 6 (six) hours as needed for wheezing or shortness of breath., Disp: 75 mL, Rfl: 3 .  albuterol (VENTOLIN HFA) 108 (90 Base) MCG/ACT inhaler, Inhale 2 puffs into the lungs every 6 (six) hours as needed for wheezing or shortness of breath., Disp: 18 g, Rfl: 2 .  Budeson-Glycopyrrol-Formoterol (BREZTRI AEROSPHERE) 160-9-4.8 MCG/ACT AERO, Inhale 2 puffs into the lungs in the morning and at bedtime., Disp: 10.7 g, Rfl: 4 .  Budeson-Glycopyrrol-Formoterol (BREZTRI AEROSPHERE) 160-9-4.8 MCG/ACT AERO, Inhale 2 puffs into the lungs in the morning and at bedtime., Disp: 10.7 g, Rfl: 5 .  Budeson-Glycopyrrol-Formoterol (BREZTRI AEROSPHERE) 160-9-4.8 MCG/ACT AERO, Inhale 2 puffs into the lungs in the morning and at bedtime., Disp: 10.7 g, Rfl: 0 .  Budeson-Glycopyrrol-Formoterol (BREZTRI AEROSPHERE) 160-9-4.8 MCG/ACT AERO, Inhale 2 puffs into the lungs in the morning and at bedtime., Disp: 10.7 g, Rfl: 6 .  busPIRone (BUSPAR) 5 MG tablet, TAKE ONE TABLET BY MOUTH TWICE A DAY AS NEEDED FOR ANXIETY, Disp: 90 tablet, Rfl: 2 .  Capsaicin 0.1 % CREA, Use gloves to apply to both feet according to package instructions for foot pain., Disp: 56.6 g, Rfl: 0 .  DULoxetine (CYMBALTA) 30 MG capsule, Take 1 capsule (30 mg total) by mouth daily., Disp: 30 capsule, Rfl: 3 .   ELIQUIS 5 MG TABS tablet, TAKE ONE TABLET BY MOUTH TWICE A DAY, Disp: 60 tablet, Rfl: 10 .  escitalopram (LEXAPRO) 20 MG tablet, Take 1 tablet (20 mg total) by mouth daily., Disp: 90 tablet, Rfl: 1 .  furosemide (LASIX) 40 MG tablet, TAKE 1-2 TABELTS BY MOUTH TWO TIMES A DAY, Disp: 120 tablet, Rfl: 4 .  gabapentin (NEURONTIN) 100 MG capsule, Take 3 capsules (300 mg total) by mouth at bedtime., Disp: 90 capsule, Rfl: 5 .  KLOR-CON M20 20 MEQ tablet, TAKE ONE TABLET BY MOUTH TWICE A DAY, Disp: 60 tablet, Rfl: 2 .  levothyroxine (SYNTHROID) 150 MCG tablet, TAKE ONE TABLET BY MOUTH DAILY, Disp: 90 tablet, Rfl: 0 .  meloxicam (MOBIC) 7.5 MG tablet, Take 1 tablet (7.5 mg total) by mouth daily., Disp: 30 tablet, Rfl: 0 .  metFORMIN (GLUCOPHAGE) 500 MG tablet, Take 1 tablet (500 mg total) by mouth daily with breakfast., Disp: 90 tablet, Rfl: 3 .  montelukast (SINGULAIR) 10 MG tablet, Take 1 tablet (10 mg total) by mouth daily., Disp: 30 tablet, Rfl: 11 .  Multiple Vitamins-Minerals (MULTIVITAMINS THER. W/MINERALS) TABS, Take 1 tablet by mouth daily., Disp: , Rfl:  .  nortriptyline (PAMELOR) 50 MG capsule, Take 1 capsule (50 mg total) by mouth at bedtime., Disp: 90 capsule, Rfl: 1 .  propranolol (INDERAL) 10 MG tablet, TAKE ONE TABLET BY MOUTH TWICE A DAY, Disp: 120 tablet, Rfl: 0 .  Spacer/Aero-Holding Chambers (AEROCHAMBER MV) inhaler, Use as instructed, Disp: 1 each, Rfl: 0 .  traMADol (ULTRAM) 50 MG tablet,  TAKE 1 TABLET BY MOUTH EVERY 12 HOURS AS NEEDED FOR MODERATE PAIN, Disp: 30 tablet, Rfl: 2 .  traZODone (DESYREL) 50 MG tablet, TAKE ONE-HALF TO 1 FULL TABLET AT BEDTIME AS NEEDED FOR SLEEP, Disp: 90 tablet, Rfl: 2 .  predniSONE (DELTASONE) 10 MG tablet, 4 tabs for 2 days, then 3 tabs for 2 days, 2 tabs for 2 days, then 1 tab for 2 days, then stop (Patient not taking: Reported on 05/10/2020), Disp: 20 tablet, Rfl: 0 .  pregabalin (LYRICA) 75 MG capsule, TAKE ONE CAPSULE BY MOUTH TWICE A DAY, Disp: 60  capsule, Rfl: 0  Past Medical History: Past Medical History:  Diagnosis Date  . Allergy    States his nose runs when he is around grease.   Marland Kitchen Anxiety   . Arthritis   . Atrial fibrillation ([place])   . Cancer ([place])   . Coronary artery disease   . History of total knee replacement   . Hypertension   . Hypothyroidism   . Kidney cancer, primary, with metastasis from kidney to other site [facility])   . Kidney stone   . OSA (obstructive sleep apnea)    pt does not wear cpap at night  . Pituitary cyst ([place])   . Pneumonia    hx of  . Prostate cancer ([place])     Tobacco Use: Social History   Tobacco Use  Smoking Status Never Smoker  Smokeless Tobacco Never Used    Labs: Recent Review Flowsheet Data    Labs for ITP Cardiac and Pulmonary Rehab Latest Ref Rng & Units 02/22/2018 05/10/2018 07/14/2019 07/14/2019 07/31/2019   Cholestrol 0 - 200 mg/dL - - - - -   LDLCALC 0 - 99 mg/dL - - - - -   LDLDIRECT mg/dL - - - - -   HDL >39.00 mg/dL - - - - -   Trlycerides <150 mg/dL - - - - -   Hemoglobin A1c 4.6 - 6.5 % - 5.9 - - 6.3   PHART 7.35 - 7.45 7.441 - - 7.353 -   PCO2ART 32 - 48 mmHg 44.1 - - 56.1(H) -   HCO3 20.0 - 28.0 mmol/L 29.5(H) - 32.0(H) 31.2(H) -   TCO2 22 - 32 mmol/L 31 - 34(H) 33(H) -   ACIDBASEDEF 0.0 - 2.0 mmol/L - - - - -   O2SAT % 95.0 - 72.0 96.0 -      Capillary Blood Glucose: Lab Results  Component Value Date   GLUCAP 116 (H) 12/21/2019   GLUCAP 108 (H) 12/21/2019   GLUCAP 98 02/24/2018   GLUCAP 132 (H) 02/23/2018   GLUCAP 119 (H) 02/23/2018     Pulmonary Assessment Scores:  UCSD: Self-administered rating of dyspnea associated with activities of daily living (ADLs) 6-point scale (0 = "not at all" to 5 = "maximal or unable to do because of breathlessness")  Scoring Scores range from 0 to 120.  Minimally important difference is 5 units  CAT: CAT can identify the health impairment of COPD patients and is better correlated with disease progression.  CAT has a  scoring range of zero to 40. The CAT score is classified into four groups of low (less than 10), medium (10 - 20), high (21-30) and very high (31-40) based on the impact level of disease on health status. A CAT score over 10 suggests significant symptoms.  A worsening CAT score could be explained by an exacerbation, poor medication adherence, poor inhaler technique, or progression of COPD or comorbid conditions.  CAT MCID is 2 points  mMRC: mMRC (Modified Medical Research Council) Dyspnea Scale is used to assess the degree of baseline functional disability in patients of respiratory disease due to dyspnea. No minimal important difference is established. A decrease in score of 1 point or greater is considered a positive change.   Pulmonary Function Assessment:  Pulmonary Function Assessment - 05/10/20 1430      Breath   Shortness of Breath Yes;Limiting activity;Fear of Shortness of Breath           Exercise Target Goals: Exercise Program Goal: Individual exercise prescription set using results from initial 6 min walk test and THRR while considering  patient's activity barriers and safety.   Exercise Prescription Goal: Initial exercise prescription builds to 30-45 minutes a day of aerobic activity, 2-3 days per week.  Home exercise guidelines will be given to patient during program as part of exercise prescription that the participant will acknowledge.  Activity Barriers & Risk Stratification:  Activity Barriers & Cardiac Risk Stratification - 05/10/20 1429      Activity Barriers & Cardiac Risk Stratification   Activity Barriers Left Knee Replacement;Right Knee Replacement;Shortness of Breath;Deconditioning    Cardiac Risk Stratification High           6 Minute Walk:  6 Minute Walk    Row Name 05/10/20 1602         6 Minute Walk   Phase Initial     Distance 400 feet     Walk Time 6 minutes     # of Rest Breaks 1  Took a seated break from minute 2 until minute 4, due to SOB      MPH 0.76     METS 0.14     RPE 15     Perceived Dyspnea  3     VO2 Peak 0.48     Symptoms Yes (comment)     Comments Severe shortness of breath caused him to take a seated break that lasted for 2 minutes     Resting HR 77 bpm     Resting BP 112/70     Resting Oxygen Saturation  97 %     Exercise Oxygen Saturation  during 6 min walk 90 %     Max Ex. HR 102 bpm     Max Ex. BP 146/74     2 Minute Post BP 136/72       Interval HR   1 Minute HR 93     2 Minute HR 102     3 Minute HR 91     4 Minute HR 87     5 Minute HR 93     6 Minute HR 90     2 Minute Post HR 82     Interval Heart Rate? Yes       Interval Oxygen   Interval Oxygen? Yes     Baseline Oxygen Saturation % 97 %     1 Minute Oxygen Saturation % 95 %     1 Minute Liters of Oxygen 4 L     2 Minute Oxygen Saturation % 90 %     2 Minute Liters of Oxygen 4 L     3 Minute Oxygen Saturation % 91 %     3 Minute Liters of Oxygen 4 L     4 Minute Oxygen Saturation % 96 %     4 Minute Liters of Oxygen 4 L     5 Minute Oxygen Saturation %  95 %     5 Minute Liters of Oxygen 4 L     6 Minute Oxygen Saturation % 92 %     6 Minute Liters of Oxygen 4 L     2 Minute Post Oxygen Saturation % 98 %     2 Minute Post Liters of Oxygen 4 L            Oxygen Initial Assessment:  Oxygen Initial Assessment - 05/10/20 1600      Home Oxygen   Home Oxygen Device Home Concentrator;Portable Concentrator    Sleep Oxygen Prescription Continuous    Liters per minute 5    Home Exercise Oxygen Prescription Pulsed    Liters per minute 5    Home Resting Oxygen Prescription None    Compliance with Home Oxygen Use No    Comments --   Pt only uses oxygen when he feels he needs it     Initial 6 min Walk   Oxygen Used Continuous    Liters per minute 4      Program Oxygen Prescription   Program Oxygen Prescription Continuous    Liters per minute 4      Intervention   Short Term Goals To learn and exhibit compliance with exercise,  home and travel O2 prescription;To learn and understand importance of monitoring SPO2 with pulse oximeter and demonstrate accurate use of the pulse oximeter.;To learn and understand importance of maintaining oxygen saturations>88%;To learn and demonstrate proper pursed lip breathing techniques or other breathing techniques.;To learn and demonstrate proper use of respiratory medications    Long  Term Goals Exhibits compliance with exercise, home and travel O2 prescription;Verbalizes importance of monitoring SPO2 with pulse oximeter and return demonstration;Maintenance of O2 saturations>88%;Exhibits proper breathing techniques, such as pursed lip breathing or other method taught during program session;Compliance with respiratory medication;Demonstrates proper use of MDI's           Oxygen Re-Evaluation:   Oxygen Discharge (Final Oxygen Re-Evaluation):   Initial Exercise Prescription:  Initial Exercise Prescription - 05/10/20 1600      Date of Initial Exercise RX and Referring Provider   Date 05/10/20    Referring Provider Dr. Elsworth Soho    Expected Discharge Date 07/11/20      Oxygen   Oxygen Continuous    Liters 4      NuStep   Level 1    Minutes 30      Prescription Details   Frequency (times per week) 2    Duration Progress to 30 minutes of continuous aerobic without signs/symptoms of physical distress      Intensity   THRR 40-80% of Max Heartrate 57-114    Ratings of Perceived Exertion 11-13    Perceived Dyspnea 0-4      Progression   Progression Continue to progress workloads to maintain intensity without signs/symptoms of physical distress.      Resistance Training   Training Prescription Yes    Weight Blue bands    Reps 10-15           Perform Capillary Blood Glucose checks as needed.  Exercise Prescription Changes:   Exercise Comments:   Exercise Goals and Review:  Exercise Goals    Row Name 05/10/20 1601             Exercise Goals   Increase  Physical Activity Yes       Intervention Provide advice, education, support and counseling about physical activity/exercise needs.;Develop an individualized exercise prescription for aerobic and  resistive training based on initial evaluation findings, risk stratification, comorbidities and participant's personal goals.       Expected Outcomes Short Term: Attend rehab on a regular basis to increase amount of physical activity.;Long Term: Add in home exercise to make exercise part of routine and to increase amount of physical activity.;Long Term: Exercising regularly at least 3-5 days a week.       Increase Strength and Stamina Yes       Intervention Provide advice, education, support and counseling about physical activity/exercise needs.;Develop an individualized exercise prescription for aerobic and resistive training based on initial evaluation findings, risk stratification, comorbidities and participant's personal goals.       Expected Outcomes Short Term: Increase workloads from initial exercise prescription for resistance, speed, and METs.;Short Term: Perform resistance training exercises routinely during rehab and add in resistance training at home;Long Term: Improve cardiorespiratory fitness, muscular endurance and strength as measured by increased METs and functional capacity (6MWT)       Able to understand and use rate of perceived exertion (RPE) scale Yes       Intervention Provide education and explanation on how to use RPE scale       Expected Outcomes Short Term: Able to use RPE daily in rehab to express subjective intensity level;Long Term:  Able to use RPE to guide intensity level when exercising independently       Able to understand and use Dyspnea scale Yes       Intervention Provide education and explanation on how to use Dyspnea scale       Expected Outcomes Short Term: Able to use Dyspnea scale daily in rehab to express subjective sense of shortness of breath during exertion;Long Term:  Able to use Dyspnea scale to guide intensity level when exercising independently       Knowledge and understanding of Target Heart Rate Range (THRR) Yes       Intervention Provide education and explanation of THRR including how the numbers were predicted and where they are located for reference       Expected Outcomes Short Term: Able to state/look up THRR;Long Term: Able to use THRR to govern intensity when exercising independently;Short Term: Able to use daily as guideline for intensity in rehab       Understanding of Exercise Prescription Yes       Intervention Provide education, explanation, and written materials on patient's individual exercise prescription       Expected Outcomes Short Term: Able to explain program exercise prescription;Long Term: Able to explain home exercise prescription to exercise independently              Exercise Goals Re-Evaluation :   Discharge Exercise Prescription (Final Exercise Prescription Changes):   Nutrition:  Target Goals: Understanding of nutrition guidelines, daily intake of sodium 1500mg , cholesterol 200mg , calories 30% from fat and 7% or less from saturated fats, daily to have 5 or more servings of fruits and vegetables.  Biometrics:  Pre Biometrics - 05/10/20 1602      Pre Biometrics   Grip Strength 25 kg            Nutrition Therapy Plan and Nutrition Goals:   Nutrition Assessments:  MEDIFICTS Score Key:  ?70 Need to make dietary changes   40-70 Heart Healthy Diet  ? 40 Therapeutic Level Cholesterol Diet   Picture Your Plate Scores:  <93 Unhealthy dietary pattern with much room for improvement.  41-50 Dietary pattern unlikely to meet recommendations for good health  and room for improvement.  51-60 More healthful dietary pattern, with some room for improvement.   >60 Healthy dietary pattern, although there may be some specific behaviors that could be improved.    Nutrition Goals Re-Evaluation:   Nutrition  Goals Discharge (Final Nutrition Goals Re-Evaluation):   Psychosocial: Target Goals: Acknowledge presence or absence of significant depression and/or stress, maximize coping skills, provide positive support system. Participant is able to verbalize types and ability to use techniques and skills needed for reducing stress and depression.  Initial Review & Psychosocial Screening:  Initial Psych Review & Screening - 05/10/20 1431      Initial Review   Current issues with None Identified      Family Dynamics   Good Support System? Yes   Wife and son live with them. They are both supportive     Barriers   Psychosocial barriers to participate in program The patient should benefit from training in stress management and relaxation.      Screening Interventions   Interventions Encouraged to exercise           Quality of Life Scores:  Scores of 19 and below usually indicate a poorer quality of life in these areas.  A difference of  2-3 points is a clinically meaningful difference.  A difference of 2-3 points in the total score of the Quality of Life Index has been associated with significant improvement in overall quality of life, self-image, physical symptoms, and general health in studies assessing change in quality of life.  PHQ-9: Recent Review Flowsheet Data    Depression screen [facility] 2/9 05/10/2020 12/13/2019 07/31/2019 06/21/2019 05/05/2019   Decreased Interest 2 0 0 0 3   Down, Depressed, Hopeless 0 0 0 0 0   PHQ - 2 Score 2 0 0 0 3   Altered sleeping 3 0 0 0 3   Tired, decreased energy 3 0 0 1 3   Change in appetite 0 0 0 0 3   Feeling bad or failure about yourself  0 0 0 0 0   Trouble concentrating 0 0 0 0 1   Moving slowly or fidgety/restless 0 0 0 0 0   Suicidal thoughts 0 0 0 0 -   PHQ-9 Score 8 0 0 1 13   Difficult doing work/chores Very difficult Not difficult at all Not difficult at all Not difficult at all -     Interpretation of Total Score  Total Score Depression  Severity:  1-4 = Minimal depression, 5-9 = Mild depression, 10-14 = Moderate depression, 15-19 = Moderately severe depression, 20-27 = Severe depression   Psychosocial Evaluation and Intervention:   Psychosocial Re-Evaluation:   Psychosocial Discharge (Final Psychosocial Re-Evaluation):   Education: Education Goals: Education classes will be provided on a weekly basis, covering required topics. Participant will state understanding/return demonstration of topics presented.  Learning Barriers/Preferences:  Learning Barriers/Preferences - 05/10/20 1433      Learning Barriers/Preferences   Learning Barriers None    Learning Preferences Verbal Instruction           Education Topics: Risk Factor Reduction:  -Group instruction that is supported by a PowerPoint presentation. Instructor discusses the definition of a risk factor, different risk factors for pulmonary disease, and how the heart and lungs work together.     Nutrition for Pulmonary Patient:  -Group instruction provided by PowerPoint slides, verbal discussion, and written materials to support subject matter. The instructor gives an explanation and review of healthy diet recommendations, which  includes a discussion on weight management, recommendations for fruit and vegetable consumption, as well as protein, fluid, caffeine, fiber, sodium, sugar, and alcohol. Tips for eating when patients are short of breath are discussed.   Pursed Lip Breathing:  -Group instruction that is supported by demonstration and informational handouts. Instructor discusses the benefits of pursed lip and diaphragmatic breathing and detailed demonstration on how to preform both.     PULMONARY REHAB OTHER RESPIRATORY from 12/19/2019 in [place]  Date 12/19/19  Educator Handout      Oxygen Safety:  -Group instruction provided by PowerPoint, verbal discussion, and written material to support subject matter. There is an  overview of "What is Oxygen" and "Why do we need it".  Instructor also reviews how to create a safe environment for oxygen use, the importance of using oxygen as prescribed, and the risks of noncompliance. There is a brief discussion on traveling with oxygen and resources the patient may utilize.   Oxygen Equipment:  -Group instruction provided by [facility] Staff utilizing handouts, written materials, and equipment demonstrations.   Signs and Symptoms:  -Group instruction provided by written material and verbal discussion to support subject matter. Warning signs and symptoms of infection, stroke, and heart attack are reviewed and when to call the physician/911 reinforced. Tips for preventing the spread of infection discussed.   Advanced Directives:  -Group instruction provided by verbal instruction and written material to support subject matter. Instructor reviews Advanced Directive laws and proper instruction for filling out document.   Pulmonary Video:  -Group video education that reviews the importance of medication and oxygen compliance, exercise, good nutrition, pulmonary hygiene, and pursed lip and diaphragmatic breathing for the pulmonary patient.   Exercise for the Pulmonary Patient:  -Group instruction that is supported by a PowerPoint presentation. Instructor discusses benefits of exercise, core components of exercise, frequency, duration, and intensity of an exercise routine, importance of utilizing pulse oximetry during exercise, safety while exercising, and options of places to exercise outside of rehab.     Pulmonary Medications:  -Verbally interactive group education provided by instructor with focus on inhaled medications and proper administration.   Anatomy and Physiology of the Respiratory System and Intimacy:  -Group instruction provided by PowerPoint, verbal discussion, and written material to support subject matter. Instructor reviews respiratory cycle and  anatomical components of the respiratory system and their functions. Instructor also reviews differences in obstructive and restrictive respiratory diseases with examples of each. Intimacy, Sex, and Sexuality differences are reviewed with a discussion on how relationships can change when diagnosed with pulmonary disease. Common sexual concerns are reviewed.   MD DAY -A group question and answer session with a medical doctor that allows participants to ask questions that relate to their pulmonary disease state.   OTHER EDUCATION -Group or individual verbal, written, or video instructions that support the educational goals of the pulmonary rehab program.   PULMONARY REHAB OTHER RESPIRATORY from 12/26/2019 in [place]  Date 12/26/19  Educator Handout      Holiday Eating Survival Tips:  -Group instruction provided by PowerPoint slides, verbal discussion, and written materials to support subject matter. The instructor gives patients tips, tricks, and techniques to help them not only survive but enjoy the holidays despite the onslaught of food that accompanies the holidays.   Knowledge Questionnaire Score:   Core Components/Risk Factors/Patient Goals at Admission:  Personal Goals and Risk Factors at Admission - 05/10/20 1435  Core Components/Risk Factors/Patient Goals on Admission    Weight Management Obesity;Yes    Improve shortness of breath with ADL's Yes    Intervention Provide education, individualized exercise plan and daily activity instruction to help decrease symptoms of SOB with activities of daily living.    Expected Outcomes Short Term: Improve cardiorespiratory fitness to achieve a reduction of symptoms when performing ADLs;Long Term: Be able to perform more ADLs without symptoms or delay the onset of symptoms           Core Components/Risk Factors/Patient Goals Review:    Core Components/Risk Factors/Patient Goals at Discharge (Final  Review):    ITP Comments:   Comments:

## 2020-05-10 NOTE — Progress Notes (Signed)
Wayne Booth 79 y.o. male Pulmonary Rehab Orientation Note This patient who was referred to Pulmonary rehab by Dr. Elsworth Soho with the diagnosis of ILD arrived today in Cardiac and Pulmonary Rehab. He arrived ambulatory without supplemental oxygen with slow gait. He does have a portable oxygen concentrator and a home concentrator but did not bring the portable concentrator with him. [facility] is the provider for their MDE. Per pt, he uses oxygen when he feels he needs it and with exertion. Pt was dopped off at [facility] entrance and walked into pulmonary rehab very short of breath. Once Pt was seated his oxygen saturation was 82%. We provided him with continuous oxygen on 4L. Pt could not remember what Liter flow he was prescribed but was pretty sure it was 5L. Color good, skin warm and dry. Patient is oriented to time and place. Patient's medical history, psychosocial health, and medications reviewed. Psychosocial assessment reveals pt lives with their spouse and son. Pt is currently retired. Pt hobbies include fishing. Pt reports his stress level is moderate. Areas of stress/anxiety include Health.  Pt does not exhibit signs of depression. PHQ2/9 score 2/8. Pt shows fair coping skills with positive outlook . [place] was offered emotional support and reassurance. Will continue to monitor and evaluate progress toward psychosocial goal(s) of being able to do more and go fishing again. Patient reports he does take medications as prescribed as his wife is there to help remind him. Patient states he follows a Regular diet. Patient states he has been trying to lose weight through a healthy diet. Patient's weight will be monitored closely. Demonstration and practice of PLB using pulse oximeter. Patient able to return demonstration satisfactorily. Safety and hand hygiene in the exercise area reviewed with patient. Patient voices understanding of the information reviewed. Department expectations discussed with patient and  achievable goals were set. The patient shows enthusiasm about attending the program and we look forward to working with this nice man. The patient is scheduled to begin exercise on 05/14/20. 45 minutes was spent on a variety of activities such as assessment of the patient, obtaining baseline data including height, weight, BMI, and grip strength, verifying medical history, allergies, and current medications, and teaching patient strategies for performing tasks with less respiratory effort with emphasis on pursed lip breathing.  Rick Duff MS, ACSM CEP

## 2020-05-14 ENCOUNTER — Other Ambulatory Visit: Payer: Self-pay | Admitting: Family Medicine

## 2020-05-14 ENCOUNTER — Encounter (HOSPITAL_COMMUNITY)
Admission: RE | Admit: 2020-05-14 | Discharge: 2020-05-14 | Disposition: A | Discharge status: Home / Self Care | Payer: Medicare Other | Source: Ambulatory Visit | Attending: Pulmonary Disease | Admitting: Pulmonary Disease

## 2020-05-14 ENCOUNTER — Ambulatory Visit (INDEPENDENT_AMBULATORY_CARE_PROVIDER_SITE_OTHER): Payer: Medicare Other

## 2020-05-14 ENCOUNTER — Ambulatory Visit: Payer: Medicare Other

## 2020-05-14 ENCOUNTER — Other Ambulatory Visit: Payer: Self-pay

## 2020-05-14 VITALS — Wt 281.7 lb

## 2020-05-14 DIAGNOSIS — J849 Interstitial pulmonary disease, unspecified: Secondary | ICD-10-CM

## 2020-05-14 DIAGNOSIS — Z23 Encounter for immunization: Secondary | ICD-10-CM

## 2020-05-14 DIAGNOSIS — Z79899 Other long term (current) drug therapy: Secondary | ICD-10-CM | POA: Diagnosis not present

## 2020-05-14 LAB — GLUCOSE, CAPILLARY: Glucose-Capillary: 78 mg/dL (ref 70–99)

## 2020-05-14 NOTE — Progress Notes (Signed)
Daily Session Note  Patient Details  Name: Wayne Booth MRN: [id number] Date of Birth: [birth year] Referring Provider:     Pulmonary Rehab Walk Test from 05/10/2020 in [place]  Referring Provider Dr. Elsworth Soho      Encounter Date: 05/14/2020  Check In:  Session Check In - 05/14/20 1441      Check-In   Supervising physician immediately available to respond to emergencies [facility] immediately available    Physician(s) Dr. Ree Kida    Location [facility]    Staff Present Rosebud Poles, RN, BSN;Lisa Ysidro Evert, RN;Cayci Mcnabb Hassell Done, MS, ACSM-CEP, Exercise Physiologist    Virtual Visit No    Medication changes reported     No    Fall or balance concerns reported    No    Tobacco Cessation No Change    Warm-up and Cool-down Performed on first and last piece of equipment    Resistance Training Performed Yes    VAD Patient? No    PAD/SET Patient? No      Pain Assessment   Currently in Pain? No/denies    Pain Score 0-No pain    Multiple Pain Sites No           Capillary Blood Glucose: Results for orders placed or performed during the hospital encounter of 05/14/20 (from the past 24 hour(s))  Glucose, capillary     Status: None   Collection Time: 05/14/20  2:35 PM  Result Value Ref Range   Glucose-Capillary 78 70 - 99 mg/dL    POCT Glucose - 05/14/20 1454      POCT Blood Glucose   Pre-Exercise 102 mg/dL    Post-Exercise 78 mg/dL           Exercise Prescription Changes - 05/14/20 1400      Response to Exercise   Blood Pressure (Admit) 110/70    Blood Pressure (Exercise) 100/60    Blood Pressure (Exit) 114/60    Heart Rate (Admit) 82 bpm    Heart Rate (Exercise) 96 bpm    Heart Rate (Exit) 88 bpm    Oxygen Saturation (Admit) 97 %    Oxygen Saturation (Exercise) 94 %    Oxygen Saturation (Exit) 97 %    Rating of Perceived Exertion (Exercise) 12    Perceived Dyspnea (Exercise) 2    Duration Continue with 30 min  of aerobic exercise without signs/symptoms of physical distress.    Intensity Other (comment)   40-80% HR max     Progression   Progression Continue to progress workloads to maintain intensity without signs/symptoms of physical distress.      Resistance Training   Training Prescription Yes    Weight Blue bands    Reps 10-15    Time 10 Minutes      Oxygen   Oxygen Continuous    Liters 4      NuStep   Level 1    Minutes 30    METs 1.8           Social History   Tobacco Use  Smoking Status Never Smoker  Smokeless Tobacco Never Used    Goals Met:  Proper associated with RPD/PD & O2 Sat Exercise tolerated well No report of cardiac concerns or symptoms Strength training completed today  Goals Unmet:  Not Applicable  Comments: Service time is from 1330 to 1440    Dr. Fransico Him is Medical Director for Cardiac Rehab at [facility].

## 2020-05-15 LAB — GLUCOSE, CAPILLARY: Glucose-Capillary: 102 mg/dL — ABNORMAL HIGH (ref 70–99)

## 2020-05-16 ENCOUNTER — Other Ambulatory Visit: Payer: Self-pay

## 2020-05-16 ENCOUNTER — Encounter (HOSPITAL_COMMUNITY)
Admission: RE | Admit: 2020-05-16 | Discharge: 2020-05-16 | Disposition: A | Discharge status: Home / Self Care | Payer: Medicare Other | Source: Ambulatory Visit | Attending: Pulmonary Disease | Admitting: Pulmonary Disease

## 2020-05-16 DIAGNOSIS — J849 Interstitial pulmonary disease, unspecified: Secondary | ICD-10-CM | POA: Diagnosis not present

## 2020-05-16 DIAGNOSIS — Z79899 Other long term (current) drug therapy: Secondary | ICD-10-CM | POA: Diagnosis not present

## 2020-05-16 LAB — GLUCOSE, CAPILLARY
Glucose-Capillary: 133 mg/dL — ABNORMAL HIGH (ref 70–99)
Glucose-Capillary: 152 mg/dL — ABNORMAL HIGH (ref 70–99)

## 2020-05-16 NOTE — Progress Notes (Signed)
Daily Session Note  Patient Details  Name: Wayne Booth MRN: [id number] Date of Birth: March 21, 1941 Referring Provider:     Pulmonary Rehab Walk Test from 05/10/2020 in [place]  Referring Provider Dr. Elsworth Soho      Encounter Date: 05/16/2020  Check In:  Session Check In - 05/16/20 1347      Check-In   Supervising physician immediately available to respond to emergencies [facility] immediately available    Physician(s) Dr. Algis Liming    Location [facility]    Staff Present Rosebud Poles, RN, BSN;Angelgabriel Willmore Ysidro Evert, RN;Jessica Hassell Done, MS, ACSM-CEP, Exercise Physiologist    Virtual Visit No    Medication changes reported     No    Fall or balance concerns reported    No    Tobacco Cessation No Change    Warm-up and Cool-down Performed as group-led instruction    Resistance Training Performed Yes    VAD Patient? No    PAD/SET Patient? No      Pain Assessment   Currently in Pain? No/denies    Multiple Pain Sites No           Capillary Blood Glucose: Results for orders placed or performed during the hospital encounter of 05/16/20 (from the past 24 hour(s))  Glucose, capillary     Status: Abnormal   Collection Time: 05/16/20  1:23 PM  Result Value Ref Range   Glucose-Capillary 133 (H) 70 - 99 mg/dL  Glucose, capillary     Status: Abnormal   Collection Time: 05/16/20  2:17 PM  Result Value Ref Range   Glucose-Capillary 152 (H) 70 - 99 mg/dL      Social History   Tobacco Use  Smoking Status Never Smoker  Smokeless Tobacco Never Used    Goals Met:  Exercise tolerated well No report of cardiac concerns or symptoms Strength training completed today  Goals Unmet:  Not Applicable  Comments: Service time is from 1315 to 1422    Dr. Fransico Him is Medical Director for Cardiac Rehab at [facility].

## 2020-05-20 NOTE — Progress Notes (Signed)
Pulmonary Individual Treatment Plan  Patient Details  Name: Wayne Booth MRN: [id number] Date of Birth: [birth year] Referring Provider:     Pulmonary Rehab Walk Test from 05/10/2020 in [place]  Referring Provider Dr. Elsworth Soho      Initial Encounter Date:    Pulmonary Rehab Walk Test from 05/10/2020 in [place]  Date 05/10/20      Visit Diagnosis: Interstitial lung disease ([place])  Patient's Home Medications on Admission:   Current Outpatient Medications:  .  albuterol (PROVENTIL) (2.5 MG/3ML) 0.083% nebulizer solution, Take 3 mLs (2.5 mg total) by nebulization every 6 (six) hours as needed for wheezing or shortness of breath., Disp: 75 mL, Rfl: 3 .  albuterol (VENTOLIN HFA) 108 (90 Base) MCG/ACT inhaler, Inhale 2 puffs into the lungs every 6 (six) hours as needed for wheezing or shortness of breath., Disp: 18 g, Rfl: 2 .  Budeson-Glycopyrrol-Formoterol (BREZTRI AEROSPHERE) 160-9-4.8 MCG/ACT AERO, Inhale 2 puffs into the lungs in the morning and at bedtime., Disp: 10.7 g, Rfl: 4 .  Budeson-Glycopyrrol-Formoterol (BREZTRI AEROSPHERE) 160-9-4.8 MCG/ACT AERO, Inhale 2 puffs into the lungs in the morning and at bedtime., Disp: 10.7 g, Rfl: 5 .  Budeson-Glycopyrrol-Formoterol (BREZTRI AEROSPHERE) 160-9-4.8 MCG/ACT AERO, Inhale 2 puffs into the lungs in the morning and at bedtime., Disp: 10.7 g, Rfl: 0 .  Budeson-Glycopyrrol-Formoterol (BREZTRI AEROSPHERE) 160-9-4.8 MCG/ACT AERO, Inhale 2 puffs into the lungs in the morning and at bedtime., Disp: 10.7 g, Rfl: 6 .  busPIRone (BUSPAR) 5 MG tablet, TAKE ONE TABLET BY MOUTH TWICE A DAY AS NEEDED FOR ANXIETY, Disp: 90 tablet, Rfl: 2 .  Capsaicin 0.1 % CREA, Use gloves to apply to both feet according to package instructions for foot pain., Disp: 56.6 g, Rfl: 0 .  DULoxetine (CYMBALTA) 30 MG capsule, Take 1 capsule (30 mg total) by mouth daily., Disp: 30 capsule, Rfl: 3 .  ELIQUIS 5 MG  TABS tablet, TAKE ONE TABLET BY MOUTH TWICE A DAY, Disp: 60 tablet, Rfl: 10 .  escitalopram (LEXAPRO) 20 MG tablet, Take 1 tablet (20 mg total) by mouth daily., Disp: 90 tablet, Rfl: 1 .  furosemide (LASIX) 40 MG tablet, TAKE 1-2 TABELTS BY MOUTH TWO TIMES A DAY, Disp: 120 tablet, Rfl: 4 .  gabapentin (NEURONTIN) 100 MG capsule, Take 3 capsules (300 mg total) by mouth at bedtime., Disp: 90 capsule, Rfl: 5 .  KLOR-CON M20 20 MEQ tablet, TAKE ONE TABLET BY MOUTH TWICE A DAY, Disp: 60 tablet, Rfl: 2 .  levothyroxine (SYNTHROID) 150 MCG tablet, TAKE ONE TABLET BY MOUTH DAILY, Disp: 90 tablet, Rfl: 0 .  meloxicam (MOBIC) 7.5 MG tablet, Take 1 tablet (7.5 mg total) by mouth daily., Disp: 30 tablet, Rfl: 0 .  metFORMIN (GLUCOPHAGE) 500 MG tablet, Take 1 tablet (500 mg total) by mouth daily with breakfast., Disp: 90 tablet, Rfl: 3 .  montelukast (SINGULAIR) 10 MG tablet, Take 1 tablet (10 mg total) by mouth daily., Disp: 30 tablet, Rfl: 11 .  Multiple Vitamins-Minerals (MULTIVITAMINS THER. W/MINERALS) TABS, Take 1 tablet by mouth daily., Disp: , Rfl:  .  nortriptyline (PAMELOR) 50 MG capsule, Take 1 capsule (50 mg total) by mouth at bedtime., Disp: 90 capsule, Rfl: 1 .  predniSONE (DELTASONE) 10 MG tablet, 4 tabs for 2 days, then 3 tabs for 2 days, 2 tabs for 2 days, then 1 tab for 2 days, then stop (Patient not taking: Reported on 05/10/2020), Disp: 20 tablet, Rfl: 0 .  pregabalin (LYRICA) 75 MG capsule, TAKE ONE CAPSULE BY MOUTH TWICE A DAY, Disp: 60 capsule, Rfl: 0 .  propranolol (INDERAL) 10 MG tablet, TAKE ONE TABLET BY MOUTH TWICE A DAY, Disp: 120 tablet, Rfl: 0 .  Spacer/Aero-Holding Chambers (AEROCHAMBER MV) inhaler, Use as instructed, Disp: 1 each, Rfl: 0 .  traMADol (ULTRAM) 50 MG tablet, TAKE 1 TABLET BY MOUTH EVERY 12 HOURS AS NEEDED FOR MODERATE PAIN, Disp: 30 tablet, Rfl: 2 .  traZODone (DESYREL) 50 MG tablet, TAKE 1/2 TO 1 TABLET BY MOUTH AT BEDTIME AS NEEDED FOR SLEEP, Disp: 90 tablet, Rfl:  2  Past Medical History: Past Medical History:  Diagnosis Date  . Allergy    States his nose runs when he is around grease.   Marland Kitchen Anxiety   . Arthritis   . Atrial fibrillation ([place])   . Cancer ([place])   . Coronary artery disease   . History of total knee replacement   . Hypertension   . Hypothyroidism   . Kidney cancer, primary, with metastasis from kidney to other site [facility])   . Kidney stone   . OSA (obstructive sleep apnea)    pt does not wear cpap at night  . Pituitary cyst ([place])   . Pneumonia    hx of  . Prostate cancer ([place])     Tobacco Use: Social History   Tobacco Use  Smoking Status Never Smoker  Smokeless Tobacco Never Used    Labs: Recent Review Flowsheet Data    Labs for ITP Cardiac and Pulmonary Rehab Latest Ref Rng & Units 02/22/2018 05/10/2018 07/14/2019 07/14/2019 07/31/2019   Cholestrol 0 - 200 mg/dL - - - - -   LDLCALC 0 - 99 mg/dL - - - - -   LDLDIRECT mg/dL - - - - -   HDL >39.00 mg/dL - - - - -   Trlycerides <150 mg/dL - - - - -   Hemoglobin A1c 4.6 - 6.5 % - 5.9 - - 6.3   PHART 7.35 - 7.45 7.441 - - 7.353 -   PCO2ART 32 - 48 mmHg 44.1 - - 56.1(H) -   HCO3 20.0 - 28.0 mmol/L 29.5(H) - 32.0(H) 31.2(H) -   TCO2 22 - 32 mmol/L 31 - 34(H) 33(H) -   ACIDBASEDEF 0.0 - 2.0 mmol/L - - - - -   O2SAT % 95.0 - 72.0 96.0 -      Capillary Blood Glucose: Lab Results  Component Value Date   GLUCAP 152 (H) 05/16/2020   GLUCAP 133 (H) 05/16/2020   GLUCAP 78 05/14/2020   GLUCAP 102 (H) 05/14/2020   GLUCAP 116 (H) 12/21/2019    POCT Glucose    Row Name 05/14/20 1454             POCT Blood Glucose   Pre-Exercise 102 mg/dL       Post-Exercise 78 mg/dL              Pulmonary Assessment Scores:  Pulmonary Assessment Scores    Row Name 05/10/20 1639         ADL UCSD   SOB Score total 107       CAT Score   CAT Score 30       mMRC Score   mMRC Score 4           UCSD: Self-administered rating of dyspnea associated with activities of daily  living (ADLs) 6-point scale (0 = "not at all" to 5 = "maximal or unable to do  because of breathlessness")  Scoring Scores range from 0 to 120.  Minimally important difference is 5 units  CAT: CAT can identify the health impairment of COPD patients and is better correlated with disease progression.  CAT has a scoring range of zero to 40. The CAT score is classified into four groups of low (less than 10), medium (10 - 20), high (21-30) and very high (31-40) based on the impact level of disease on health status. A CAT score over 10 suggests significant symptoms.  A worsening CAT score could be explained by an exacerbation, poor medication adherence, poor inhaler technique, or progression of COPD or comorbid conditions.  CAT MCID is 2 points  mMRC: mMRC (Modified Medical Research Council) Dyspnea Scale is used to assess the degree of baseline functional disability in patients of respiratory disease due to dyspnea. No minimal important difference is established. A decrease in score of 1 point or greater is considered a positive change.   Pulmonary Function Assessment:  Pulmonary Function Assessment - 05/10/20 1430      Breath   Shortness of Breath Yes;Limiting activity;Fear of Shortness of Breath           Exercise Target Goals: Exercise Program Goal: Individual exercise prescription set using results from initial 6 min walk test and THRR while considering  patient's activity barriers and safety.   Exercise Prescription Goal: Initial exercise prescription builds to 30-45 minutes a day of aerobic activity, 2-3 days per week.  Home exercise guidelines will be given to patient during program as part of exercise prescription that the participant will acknowledge.  Activity Barriers & Risk Stratification:  Activity Barriers & Cardiac Risk Stratification - 05/10/20 1429      Activity Barriers & Cardiac Risk Stratification   Activity Barriers Left Knee Replacement;Right Knee  Replacement;Shortness of Breath;Deconditioning    Cardiac Risk Stratification High           6 Minute Walk:  6 Minute Walk    Row Name 05/10/20 1602         6 Minute Walk   Phase Initial     Distance 400 feet     Walk Time 6 minutes     # of Rest Breaks 1  Took a seated break from minute 2 until minute 4, due to SOB     MPH 0.76     METS 0.14     RPE 15     Perceived Dyspnea  3     VO2 Peak 0.48     Symptoms Yes (comment)     Comments Severe shortness of breath caused him to take a seated break that lasted for 2 minutes     Resting HR 77 bpm     Resting BP 112/70     Resting Oxygen Saturation  97 %     Exercise Oxygen Saturation  during 6 min walk 90 %     Max Ex. HR 102 bpm     Max Ex. BP 146/74     2 Minute Post BP 136/72       Interval HR   1 Minute HR 93     2 Minute HR 102     3 Minute HR 91     4 Minute HR 87     5 Minute HR 93     6 Minute HR 90     2 Minute Post HR 82     Interval Heart Rate? Yes       Interval  Oxygen   Interval Oxygen? Yes     Baseline Oxygen Saturation % 97 %     1 Minute Oxygen Saturation % 95 %     1 Minute Liters of Oxygen 4 L     2 Minute Oxygen Saturation % 90 %     2 Minute Liters of Oxygen 4 L     3 Minute Oxygen Saturation % 91 %     3 Minute Liters of Oxygen 4 L     4 Minute Oxygen Saturation % 96 %     4 Minute Liters of Oxygen 4 L     5 Minute Oxygen Saturation % 95 %     5 Minute Liters of Oxygen 4 L     6 Minute Oxygen Saturation % 92 %     6 Minute Liters of Oxygen 4 L     2 Minute Post Oxygen Saturation % 98 %     2 Minute Post Liters of Oxygen 4 L            Oxygen Initial Assessment:  Oxygen Initial Assessment - 05/10/20 1600      Home Oxygen   Home Oxygen Device Home Concentrator;Portable Concentrator    Sleep Oxygen Prescription Continuous    Liters per minute 5    Home Exercise Oxygen Prescription Pulsed    Liters per minute 5    Home Resting Oxygen Prescription None    Compliance with Home  Oxygen Use No    Comments --   Pt only uses oxygen when he feels he needs it     Initial 6 min Walk   Oxygen Used Continuous    Liters per minute 4      Program Oxygen Prescription   Program Oxygen Prescription Continuous    Liters per minute 4      Intervention   Short Term Goals To learn and exhibit compliance with exercise, home and travel O2 prescription;To learn and understand importance of monitoring SPO2 with pulse oximeter and demonstrate accurate use of the pulse oximeter.;To learn and understand importance of maintaining oxygen saturations>88%;To learn and demonstrate proper pursed lip breathing techniques or other breathing techniques.;To learn and demonstrate proper use of respiratory medications    Long  Term Goals Exhibits compliance with exercise, home and travel O2 prescription;Verbalizes importance of monitoring SPO2 with pulse oximeter and return demonstration;Maintenance of O2 saturations>88%;Exhibits proper breathing techniques, such as pursed lip breathing or other method taught during program session;Compliance with respiratory medication;Demonstrates proper use of MDI's           Oxygen Re-Evaluation:  Oxygen Re-Evaluation    Row Name 05/16/20 0729             Program Oxygen Prescription   Program Oxygen Prescription Continuous       Liters per minute 4         Home Oxygen   Home Oxygen Device Home Concentrator;Portable Concentrator       Sleep Oxygen Prescription Continuous       Liters per minute 5       Home Exercise Oxygen Prescription Pulsed       Liters per minute 5       Home Resting Oxygen Prescription None       Compliance with Home Oxygen Use No         Goals/Expected Outcomes   Short Term Goals To learn and exhibit compliance with exercise, home and travel O2 prescription;To learn and understand importance of  monitoring SPO2 with pulse oximeter and demonstrate accurate use of the pulse oximeter.;To learn and understand importance of  maintaining oxygen saturations>88%;To learn and demonstrate proper pursed lip breathing techniques or other breathing techniques.;To learn and demonstrate proper use of respiratory medications       Long  Term Goals Exhibits compliance with exercise, home and travel O2 prescription;Verbalizes importance of monitoring SPO2 with pulse oximeter and return demonstration;Maintenance of O2 saturations>88%;Exhibits proper breathing techniques, such as pursed lip breathing or other method taught during program session;Compliance with respiratory medication;Demonstrates proper use of MDI's       Goals/Expected Outcomes compliance and understanding of oxygen saturation and pursed lip breathing              Oxygen Discharge (Final Oxygen Re-Evaluation):  Oxygen Re-Evaluation - 05/16/20 0729      Program Oxygen Prescription   Program Oxygen Prescription Continuous    Liters per minute 4      Home Oxygen   Home Oxygen Device Home Concentrator;Portable Concentrator    Sleep Oxygen Prescription Continuous    Liters per minute 5    Home Exercise Oxygen Prescription Pulsed    Liters per minute 5    Home Resting Oxygen Prescription None    Compliance with Home Oxygen Use No      Goals/Expected Outcomes   Short Term Goals To learn and exhibit compliance with exercise, home and travel O2 prescription;To learn and understand importance of monitoring SPO2 with pulse oximeter and demonstrate accurate use of the pulse oximeter.;To learn and understand importance of maintaining oxygen saturations>88%;To learn and demonstrate proper pursed lip breathing techniques or other breathing techniques.;To learn and demonstrate proper use of respiratory medications    Long  Term Goals Exhibits compliance with exercise, home and travel O2 prescription;Verbalizes importance of monitoring SPO2 with pulse oximeter and return demonstration;Maintenance of O2 saturations>88%;Exhibits proper breathing techniques, such as pursed  lip breathing or other method taught during program session;Compliance with respiratory medication;Demonstrates proper use of MDI's    Goals/Expected Outcomes compliance and understanding of oxygen saturation and pursed lip breathing           Initial Exercise Prescription:  Initial Exercise Prescription - 05/10/20 1600      Date of Initial Exercise RX and Referring Provider   Date 05/10/20    Referring Provider Dr. Elsworth Soho    Expected Discharge Date 07/11/20      Oxygen   Oxygen Continuous    Liters 4      NuStep   Level 1    Minutes 30      Prescription Details   Frequency (times per week) 2    Duration Progress to 30 minutes of continuous aerobic without signs/symptoms of physical distress      Intensity   THRR 40-80% of Max Heartrate 57-114    Ratings of Perceived Exertion 11-13    Perceived Dyspnea 0-4      Progression   Progression Continue to progress workloads to maintain intensity without signs/symptoms of physical distress.      Resistance Training   Training Prescription Yes    Weight Blue bands    Reps 10-15           Perform Capillary Blood Glucose checks as needed.  Exercise Prescription Changes:  Exercise Prescription Changes    Row Name 05/14/20 1400             Response to Exercise   Blood Pressure (Admit) 110/70       Blood  Pressure (Exercise) 100/60       Blood Pressure (Exit) 114/60       Heart Rate (Admit) 82 bpm       Heart Rate (Exercise) 96 bpm       Heart Rate (Exit) 88 bpm       Oxygen Saturation (Admit) 97 %       Oxygen Saturation (Exercise) 94 %       Oxygen Saturation (Exit) 97 %       Rating of Perceived Exertion (Exercise) 12       Perceived Dyspnea (Exercise) 2       Duration Continue with 30 min of aerobic exercise without signs/symptoms of physical distress.       Intensity Other (comment)  40-80% HR max         Progression   Progression Continue to progress workloads to maintain intensity without signs/symptoms of  physical distress.         Resistance Training   Training Prescription Yes       Weight Blue bands       Reps 10-15       Time 10 Minutes         Oxygen   Oxygen Continuous       Liters 4         NuStep   Level 1       Minutes 30       METs 1.8              Exercise Comments:  Exercise Comments    Row Name 05/14/20 1454           Exercise Comments Pt completed first day of exercise and tolerated well with no complaints. He is pre-diabetic and on metformin, his pre exercise glucose was 102 and post glucose was 78. Will continue to monitor.              Exercise Goals and Review:  Exercise Goals    Row Name 05/10/20 1601             Exercise Goals   Increase Physical Activity Yes       Intervention Provide advice, education, support and counseling about physical activity/exercise needs.;Develop an individualized exercise prescription for aerobic and resistive training based on initial evaluation findings, risk stratification, comorbidities and participant's personal goals.       Expected Outcomes Short Term: Attend rehab on a regular basis to increase amount of physical activity.;Long Term: Add in home exercise to make exercise part of routine and to increase amount of physical activity.;Long Term: Exercising regularly at least 3-5 days a week.       Increase Strength and Stamina Yes       Intervention Provide advice, education, support and counseling about physical activity/exercise needs.;Develop an individualized exercise prescription for aerobic and resistive training based on initial evaluation findings, risk stratification, comorbidities and participant's personal goals.       Expected Outcomes Short Term: Increase workloads from initial exercise prescription for resistance, speed, and METs.;Short Term: Perform resistance training exercises routinely during rehab and add in resistance training at home;Long Term: Improve cardiorespiratory fitness, muscular endurance  and strength as measured by increased METs and functional capacity (6MWT)       Able to understand and use rate of perceived exertion (RPE) scale Yes       Intervention Provide education and explanation on how to use RPE scale       Expected Outcomes Short  Term: Able to use RPE daily in rehab to express subjective intensity level;Long Term:  Able to use RPE to guide intensity level when exercising independently       Able to understand and use Dyspnea scale Yes       Intervention Provide education and explanation on how to use Dyspnea scale       Expected Outcomes Short Term: Able to use Dyspnea scale daily in rehab to express subjective sense of shortness of breath during exertion;Long Term: Able to use Dyspnea scale to guide intensity level when exercising independently       Knowledge and understanding of Target Heart Rate Range (THRR) Yes       Intervention Provide education and explanation of THRR including how the numbers were predicted and where they are located for reference       Expected Outcomes Short Term: Able to state/look up THRR;Long Term: Able to use THRR to govern intensity when exercising independently;Short Term: Able to use daily as guideline for intensity in rehab       Understanding of Exercise Prescription Yes       Intervention Provide education, explanation, and written materials on patient's individual exercise prescription       Expected Outcomes Short Term: Able to explain program exercise prescription;Long Term: Able to explain home exercise prescription to exercise independently              Exercise Goals Re-Evaluation :  Exercise Goals Re-Evaluation    Row Name 05/16/20 0727             Exercise Goal Re-Evaluation   Exercise Goals Review Increase Physical Activity;Increase Strength and Stamina;Able to understand and use rate of perceived exertion (RPE) scale;Able to understand and use Dyspnea scale;Knowledge and understanding of Target Heart Rate Range  (THRR);Understanding of Exercise Prescription       Comments Pt has completed one exercise session and tolerated well so far with no complaints or concerns. He is exercising at 1.8 METS on the Nustep. Will continue to monitor and progress as he is able.       Expected Outcomes Through exercise at rehab and at home, the patient will decrease shortness of breath with daily activities and feel confident in carrying out an exercise regime at home.              Discharge Exercise Prescription (Final Exercise Prescription Changes):  Exercise Prescription Changes - 05/14/20 1400      Response to Exercise   Blood Pressure (Admit) 110/70    Blood Pressure (Exercise) 100/60    Blood Pressure (Exit) 114/60    Heart Rate (Admit) 82 bpm    Heart Rate (Exercise) 96 bpm    Heart Rate (Exit) 88 bpm    Oxygen Saturation (Admit) 97 %    Oxygen Saturation (Exercise) 94 %    Oxygen Saturation (Exit) 97 %    Rating of Perceived Exertion (Exercise) 12    Perceived Dyspnea (Exercise) 2    Duration Continue with 30 min of aerobic exercise without signs/symptoms of physical distress.    Intensity Other (comment)   40-80% HR max     Progression   Progression Continue to progress workloads to maintain intensity without signs/symptoms of physical distress.      Resistance Training   Training Prescription Yes    Weight Blue bands    Reps 10-15    Time 10 Minutes      Oxygen   Oxygen Continuous  Liters 4      NuStep   Level 1    Minutes 30    METs 1.8           Nutrition:  Target Goals: Understanding of nutrition guidelines, daily intake of sodium 1500mg , cholesterol 200mg , calories 30% from fat and 7% or less from saturated fats, daily to have 5 or more servings of fruits and vegetables.  Biometrics:  Pre Biometrics - 05/10/20 1602      Pre Biometrics   Grip Strength 25 kg            Nutrition Therapy Plan and Nutrition Goals:   Nutrition Assessments:  MEDIFICTS Score  Key:  ?70 Need to make dietary changes   40-70 Heart Healthy Diet  ? 40 Therapeutic Level Cholesterol Diet   Picture Your Plate Scores:  <01 Unhealthy dietary pattern with much room for improvement.  41-50 Dietary pattern unlikely to meet recommendations for good health and room for improvement.  51-60 More healthful dietary pattern, with some room for improvement.   >60 Healthy dietary pattern, although there may be some specific behaviors that could be improved.    Nutrition Goals Re-Evaluation:  Nutrition Goals Re-Evaluation    Row Name 05/16/20 0831             Goals   Current Weight 281 lb 12 oz (127.8 kg)              Nutrition Goals Discharge (Final Nutrition Goals Re-Evaluation):  Nutrition Goals Re-Evaluation - 05/16/20 0831      Goals   Current Weight 281 lb 12 oz (127.8 kg)           Psychosocial: Target Goals: Acknowledge presence or absence of significant depression and/or stress, maximize coping skills, provide positive support system. Participant is able to verbalize types and ability to use techniques and skills needed for reducing stress and depression.  Initial Review & Psychosocial Screening:  Initial Psych Review & Screening - 05/10/20 1431      Initial Review   Current issues with None Identified      Family Dynamics   Good Support System? Yes   Wife and son live with them. They are both supportive     Barriers   Psychosocial barriers to participate in program The patient should benefit from training in stress management and relaxation.      Screening Interventions   Interventions Encouraged to exercise           Quality of Life Scores:  Scores of 19 and below usually indicate a poorer quality of life in these areas.  A difference of  2-3 points is a clinically meaningful difference.  A difference of 2-3 points in the total score of the Quality of Life Index has been associated with significant improvement in overall quality of  life, self-image, physical symptoms, and general health in studies assessing change in quality of life.  PHQ-9: Recent Review Flowsheet Data    Depression screen [facility] 2/9 05/10/2020 12/13/2019 07/31/2019 06/21/2019 05/05/2019   Decreased Interest 2 0 0 0 3   Down, Depressed, Hopeless 0 0 0 0 0   PHQ - 2 Score 2 0 0 0 3   Altered sleeping 3 0 0 0 3   Tired, decreased energy 3 0 0 1 3   Change in appetite 0 0 0 0 3   Feeling bad or failure about yourself  0 0 0 0 0   Trouble concentrating 0 0 0 0  1   Moving slowly or fidgety/restless 0 0 0 0 0   Suicidal thoughts 0 0 0 0 -   PHQ-9 Score 8 0 0 1 13   Difficult doing work/chores Very difficult Not difficult at all Not difficult at all Not difficult at all -     Interpretation of Total Score  Total Score Depression Severity:  1-4 = Minimal depression, 5-9 = Mild depression, 10-14 = Moderate depression, 15-19 = Moderately severe depression, 20-27 = Severe depression   Psychosocial Evaluation and Intervention:   Psychosocial Re-Evaluation:  Psychosocial Re-Evaluation    [place] Name 05/20/20 1329             Psychosocial Re-Evaluation   Current issues with None Identified       Comments No psychosocial concerns identified at this time.       Expected Outcomes For Wayne Booth to continue to be free of psychsocial concerns while participating in pulmonary rehab.       Continue Psychosocial Services  No Follow up required              Psychosocial Discharge (Final Psychosocial Re-Evaluation):  Psychosocial Re-Evaluation - 05/20/20 1329      Psychosocial Re-Evaluation   Current issues with None Identified    Comments No psychosocial concerns identified at this time.    Expected Outcomes For Wayne Booth to continue to be free of psychsocial concerns while participating in pulmonary rehab.    Continue Psychosocial Services  No Follow up required           Education: Education Goals: Education classes will be provided on a weekly basis, covering  required topics. Participant will state understanding/return demonstration of topics presented.  Learning Barriers/Preferences:  Learning Barriers/Preferences - 05/10/20 1433      Learning Barriers/Preferences   Learning Barriers None    Learning Preferences Verbal Instruction           Education Topics: Risk Factor Reduction:  -Group instruction that is supported by a PowerPoint presentation. Instructor discusses the definition of a risk factor, different risk factors for pulmonary disease, and how the heart and lungs work together.     Nutrition for Pulmonary Patient:  -Group instruction provided by PowerPoint slides, verbal discussion, and written materials to support subject matter. The instructor gives an explanation and review of healthy diet recommendations, which includes a discussion on weight management, recommendations for fruit and vegetable consumption, as well as protein, fluid, caffeine, fiber, sodium, sugar, and alcohol. Tips for eating when patients are short of breath are discussed.   Pursed Lip Breathing:  -Group instruction that is supported by demonstration and informational handouts. Instructor discusses the benefits of pursed lip and diaphragmatic breathing and detailed demonstration on how to preform both.     PULMONARY REHAB OTHER RESPIRATORY from 12/19/2019 in [place]  Date 12/19/19  Educator Handout      Oxygen Safety:  -Group instruction provided by PowerPoint, verbal discussion, and written material to support subject matter. There is an overview of "What is Oxygen" and "Why do we need it".  Instructor also reviews how to create a safe environment for oxygen use, the importance of using oxygen as prescribed, and the risks of noncompliance. There is a brief discussion on traveling with oxygen and resources the patient may utilize.   Oxygen Equipment:  -Group instruction provided by [facility] Staff utilizing handouts,  written materials, and equipment demonstrations.   Signs and Symptoms:  -Group instruction provided by written  material and verbal discussion to support subject matter. Warning signs and symptoms of infection, stroke, and heart attack are reviewed and when to call the physician/911 reinforced. Tips for preventing the spread of infection discussed.   Advanced Directives:  -Group instruction provided by verbal instruction and written material to support subject matter. Instructor reviews Advanced Directive laws and proper instruction for filling out document.   Pulmonary Video:  -Group video education that reviews the importance of medication and oxygen compliance, exercise, good nutrition, pulmonary hygiene, and pursed lip and diaphragmatic breathing for the pulmonary patient.   Exercise for the Pulmonary Patient:  -Group instruction that is supported by a PowerPoint presentation. Instructor discusses benefits of exercise, core components of exercise, frequency, duration, and intensity of an exercise routine, importance of utilizing pulse oximetry during exercise, safety while exercising, and options of places to exercise outside of rehab.     Pulmonary Medications:  -Verbally interactive group education provided by instructor with focus on inhaled medications and proper administration.   Anatomy and Physiology of the Respiratory System and Intimacy:  -Group instruction provided by PowerPoint, verbal discussion, and written material to support subject matter. Instructor reviews respiratory cycle and anatomical components of the respiratory system and their functions. Instructor also reviews differences in obstructive and restrictive respiratory diseases with examples of each. Intimacy, Sex, and Sexuality differences are reviewed with a discussion on how relationships can change when diagnosed with pulmonary disease. Common sexual concerns are reviewed.   MD DAY -A group question and answer  session with a medical doctor that allows participants to ask questions that relate to their pulmonary disease state.   OTHER EDUCATION -Group or individual verbal, written, or video instructions that support the educational goals of the pulmonary rehab program.   PULMONARY REHAB OTHER RESPIRATORY from 05/16/2020 in [place]  Date 05/16/20  Educator Handout  Willodean Rosenthal reading]      Holiday Eating Survival Tips:  -Group instruction provided by PowerPoint slides, verbal discussion, and written materials to support subject matter. The instructor gives patients tips, tricks, and techniques to help them not only survive but enjoy the holidays despite the onslaught of food that accompanies the holidays.   Knowledge Questionnaire Score:  Knowledge Questionnaire Score - 05/10/20 1641      Knowledge Questionnaire Score   Pre Score 17/18           Core Components/Risk Factors/Patient Goals at Admission:  Personal Goals and Risk Factors at Admission - 05/10/20 1435      Core Components/Risk Factors/Patient Goals on Admission    Weight Management Obesity;Yes    Improve shortness of breath with ADL's Yes    Intervention Provide education, individualized exercise plan and daily activity instruction to help decrease symptoms of SOB with activities of daily living.    Expected Outcomes Short Term: Improve cardiorespiratory fitness to achieve a reduction of symptoms when performing ADLs;Long Term: Be able to perform more ADLs without symptoms or delay the onset of symptoms           Core Components/Risk Factors/Patient Goals Review:   Goals and Risk Factor Review    Row Name 05/20/20 1330             Core Components/Risk Factors/Patient Goals Review   Personal Goals Review Improve shortness of breath with ADL's;Develop more efficient breathing techniques such as purse lipped breathing and diaphragmatic breathing and practicing self-pacing with  activity.;Increase knowledge of respiratory medications and ability to use respiratory devices properly.  Review Wayne Booth is very deconditioned, he just started the program and has attended 2 exercise sessions, too early to see progression toward goals.       Expected Outcomes To see Wayne Booth progress in the next full 30 days in his strength, stamina, and shortness of breath.              Core Components/Risk Factors/Patient Goals at Discharge (Final Review):   Goals and Risk Factor Review - 05/20/20 1330      Core Components/Risk Factors/Patient Goals Review   Personal Goals Review Improve shortness of breath with ADL's;Develop more efficient breathing techniques such as purse lipped breathing and diaphragmatic breathing and practicing self-pacing with activity.;Increase knowledge of respiratory medications and ability to use respiratory devices properly.    Review Wayne Booth is very deconditioned, he just started the program and has attended 2 exercise sessions, too early to see progression toward goals.    Expected Outcomes To see Wayne Booth progress in the next full 30 days in his strength, stamina, and shortness of breath.           ITP Comments:   Comments: ITP REVIEW Pt is making expected progress toward pulmonary rehab goals after completing 2 sessions. Recommend continued exercise, life style modification, education, and utilization of breathing techniques to increase stamina and strength and decrease shortness of breath with exertion.

## 2020-05-21 ENCOUNTER — Encounter (HOSPITAL_COMMUNITY)
Admission: RE | Admit: 2020-05-21 | Discharge: 2020-05-21 | Disposition: A | Discharge status: Home / Self Care | Payer: Medicare Other | Source: Ambulatory Visit | Attending: Pulmonary Disease | Admitting: Pulmonary Disease

## 2020-05-21 DIAGNOSIS — J849 Interstitial pulmonary disease, unspecified: Secondary | ICD-10-CM

## 2020-05-28 ENCOUNTER — Encounter (HOSPITAL_COMMUNITY)
Admission: RE | Admit: 2020-05-28 | Discharge: 2020-05-28 | Disposition: A | Discharge status: Home / Self Care | Payer: Medicare Other | Source: Ambulatory Visit | Attending: Pulmonary Disease | Admitting: Pulmonary Disease

## 2020-05-28 ENCOUNTER — Other Ambulatory Visit: Payer: Self-pay

## 2020-05-28 VITALS — Wt 280.0 lb

## 2020-05-28 DIAGNOSIS — J849 Interstitial pulmonary disease, unspecified: Secondary | ICD-10-CM

## 2020-05-28 DIAGNOSIS — Z79899 Other long term (current) drug therapy: Secondary | ICD-10-CM | POA: Diagnosis not present

## 2020-05-28 LAB — GLUCOSE, CAPILLARY: Glucose-Capillary: 126 mg/dL — ABNORMAL HIGH (ref 70–99)

## 2020-05-28 NOTE — Progress Notes (Signed)
Daily Session Note  Patient Details  Name: Wayne Booth MRN: [id number] Date of Birth: Feb 12, 1941 Referring Provider:     Pulmonary Rehab Walk Test from 05/10/2020 in [place]  Referring Provider Dr. Elsworth Soho      Encounter Date: 05/28/2020  Check In:  Session Check In - 05/28/20 1426      Check-In   Supervising physician immediately available to respond to emergencies [facility] immediately available    Physician(s) Dr. Tyrell Antonio    Location [facility]    Staff Present Rosebud Poles, RN, BSN;Lisa Ysidro Evert, RN;Jessica Hassell Done, MS, ACSM-CEP, Exercise Physiologist    Virtual Visit No    Medication changes reported     No    Fall or balance concerns reported    No    Tobacco Cessation No Change    Warm-up and Cool-down Performed on first and last piece of equipment    Resistance Training Performed Yes    VAD Patient? No    PAD/SET Patient? No      Pain Assessment   Currently in Pain? No/denies           Capillary Blood Glucose: Results for orders placed or performed during the hospital encounter of 05/28/20 (from the past 24 hour(s))  Glucose, capillary     Status: Abnormal   Collection Time: 05/28/20  2:52 PM  Result Value Ref Range   Glucose-Capillary 126 (H) 70 - 99 mg/dL     Exercise Prescription Changes - 05/28/20 1500      Response to Exercise   Blood Pressure (Admit) 136/72    Blood Pressure (Exercise) 132/76    Blood Pressure (Exit) 132/68    Heart Rate (Admit) 86 bpm    Heart Rate (Exercise) 99 bpm    Heart Rate (Exit) 94 bpm    Oxygen Saturation (Admit) 97 %    Oxygen Saturation (Exercise) 94 %    Oxygen Saturation (Exit) 97 %    Rating of Perceived Exertion (Exercise) 13    Perceived Dyspnea (Exercise) 2    Duration Continue with 30 min of aerobic exercise without signs/symptoms of physical distress.    Intensity THRR unchanged      Progression   Progression Continue to progress workloads to  maintain intensity without signs/symptoms of physical distress.      Resistance Training   Training Prescription Yes    Weight Blue bands    Reps 10-15    Time 10 Minutes      Oxygen   Oxygen Continuous    Liters 4      NuStep   Level 2    SPM 80    Minutes 30    METs 1.7           Social History   Tobacco Use  Smoking Status Never Smoker  Smokeless Tobacco Never Used    Goals Met:  Proper associated with RPD/PD & O2 Sat Exercise tolerated well Strength training completed today  Goals Unmet:  Not Applicable  Comments: Service time is from 1340 to 1440.  After leaving the department Wayne Booth felt sweaty, itchy, and not well and was found in the lobby sitting in a chair,  Glucose was checked, 126, BP 144/80, spo2 94% on 5L of pulsed oxygen, heart rate 78.  Wife came in and states he has anxiety and panic attacks.  Wayne Booth did not want to go to the ED, given water and rolled in a wheel chair to his car  and his wife drove him home.  He was feeling better when leaving the [place] entrance lobby. He states he felt like crying, wife voices his chronic illness makes him sad at times.    Dr. Fransico Him is Medical Director for Cardiac Rehab at [facility].

## 2020-05-30 ENCOUNTER — Encounter (HOSPITAL_COMMUNITY)
Admission: RE | Admit: 2020-05-30 | Discharge: 2020-05-30 | Disposition: A | Discharge status: Home / Self Care | Payer: Medicare Other | Source: Ambulatory Visit | Attending: Pulmonary Disease | Admitting: Pulmonary Disease

## 2020-05-30 ENCOUNTER — Other Ambulatory Visit: Payer: Self-pay

## 2020-05-30 VITALS — Wt 282.0 lb

## 2020-05-30 DIAGNOSIS — J849 Interstitial pulmonary disease, unspecified: Secondary | ICD-10-CM

## 2020-05-30 NOTE — Progress Notes (Signed)
Daily Session Note  Patient Details  Name: Wayne Booth MRN: [id number] Date of Birth: 1940/10/26 Referring Provider:     Pulmonary Rehab Walk Test from 05/10/2020 in [place]  Referring Provider Dr. Elsworth Soho      Encounter Date: 05/30/2020  Check In:  Session Check In - 05/30/20 1519      Check-In   Supervising physician immediately available to respond to emergencies [facility] immediately available    Physician(s) Dr. Roderic Palau    Location [facility]    Staff Present Rosebud Poles, RN, BSN;Lisa Ysidro Evert, RN;Kamera Dubas Hassell Done, MS, ACSM-CEP, Exercise Physiologist    Virtual Visit No    Medication changes reported     No    Fall or balance concerns reported    No    Tobacco Cessation No Change    Warm-up and Cool-down Performed on first and last piece of equipment    Resistance Training Performed Yes    VAD Patient? No    PAD/SET Patient? No      Pain Assessment   Currently in Pain? No/denies    Pain Score 0-No pain    Multiple Pain Sites No           Capillary Blood Glucose: No results found for this or any previous visit (from the past 24 hour(s)).    Social History   Tobacco Use  Smoking Status Never Smoker  Smokeless Tobacco Never Used    Goals Met:  Proper associated with RPD/PD & O2 Sat Exercise tolerated well No report of cardiac concerns or symptoms Strength training completed today  Goals Unmet:  Not Applicable  Comments: Service time is from 1340 to 1445    Dr. Fransico Him is Medical Director for Cardiac Rehab at [facility].

## 2020-05-31 ENCOUNTER — Other Ambulatory Visit: Payer: Self-pay | Admitting: Family Medicine

## 2020-06-03 DIAGNOSIS — G4733 Obstructive sleep apnea (adult) (pediatric): Secondary | ICD-10-CM | POA: Diagnosis not present

## 2020-06-04 ENCOUNTER — Encounter (HOSPITAL_COMMUNITY): Payer: Medicare Other

## 2020-06-04 ENCOUNTER — Other Ambulatory Visit: Payer: Self-pay | Admitting: Family Medicine

## 2020-06-04 ENCOUNTER — Other Ambulatory Visit: Payer: Self-pay | Admitting: Podiatry

## 2020-06-05 NOTE — Telephone Encounter (Signed)
Please advise 

## 2020-06-06 ENCOUNTER — Encounter (HOSPITAL_COMMUNITY)
Admission: RE | Admit: 2020-06-06 | Discharge: 2020-06-06 | Disposition: A | Discharge status: Home / Self Care | Payer: Medicare Other | Source: Ambulatory Visit | Attending: Pulmonary Disease | Admitting: Pulmonary Disease

## 2020-06-07 NOTE — Telephone Encounter (Signed)
I called patient to see if he is still taking this as I see other medications have changed. Left VM to call back.

## 2020-06-10 ENCOUNTER — Telehealth: Payer: Self-pay | Admitting: Family Medicine

## 2020-06-10 NOTE — Telephone Encounter (Signed)
Left message for patient to call back and schedule Medicare Annual Wellness Visit (AWV) either virtually OR in office.   Last AWV 04/14/19; please schedule at anytime with [facility]urse Health Advisor at [facility].  This should be a 45 minute visit.

## 2020-06-11 ENCOUNTER — Encounter (HOSPITAL_COMMUNITY)
Admission: RE | Admit: 2020-06-11 | Discharge: 2020-06-11 | Disposition: A | Discharge status: Home / Self Care | Payer: Medicare Other | Source: Ambulatory Visit | Attending: Pulmonary Disease | Admitting: Pulmonary Disease

## 2020-06-11 ENCOUNTER — Telehealth (HOSPITAL_COMMUNITY): Payer: Self-pay | Admitting: *Deleted

## 2020-06-11 DIAGNOSIS — J849 Interstitial pulmonary disease, unspecified: Secondary | ICD-10-CM

## 2020-06-11 NOTE — Progress Notes (Signed)
Pulmonary Individual Treatment Plan  Patient Details  Name: Wayne Booth MRN: [id number] Date of Birth: [birth year] Referring Provider:   April Manson Pulmonary Rehab Walk Test from 05/10/2020 in [place]  Referring Provider Dr. Elsworth Soho      Initial Encounter Date:  Flowsheet Row Pulmonary Rehab Walk Test from 05/10/2020 in [facility]  Date 05/10/20      Visit Diagnosis: ILD (interstitial lung disease) ([place])  Patient's Home Medications on Admission:   Current Outpatient Medications:    albuterol (PROVENTIL) (2.5 MG/3ML) 0.083% nebulizer solution, Take 3 mLs (2.5 mg total) by nebulization every 6 (six) hours as needed for wheezing or shortness of breath., Disp: 75 mL, Rfl: 3   albuterol (VENTOLIN HFA) 108 (90 Base) MCG/ACT inhaler, Inhale 2 puffs into the lungs every 6 (six) hours as needed for wheezing or shortness of breath., Disp: 18 g, Rfl: 2   Budeson-Glycopyrrol-Formoterol (BREZTRI AEROSPHERE) 160-9-4.8 MCG/ACT AERO, Inhale 2 puffs into the lungs in the morning and at bedtime., Disp: 10.7 g, Rfl: 4   Budeson-Glycopyrrol-Formoterol (BREZTRI AEROSPHERE) 160-9-4.8 MCG/ACT AERO, Inhale 2 puffs into the lungs in the morning and at bedtime., Disp: 10.7 g, Rfl: 5   Budeson-Glycopyrrol-Formoterol (BREZTRI AEROSPHERE) 160-9-4.8 MCG/ACT AERO, Inhale 2 puffs into the lungs in the morning and at bedtime., Disp: 10.7 g, Rfl: 0   Budeson-Glycopyrrol-Formoterol (BREZTRI AEROSPHERE) 160-9-4.8 MCG/ACT AERO, Inhale 2 puffs into the lungs in the morning and at bedtime., Disp: 10.7 g, Rfl: 6   busPIRone (BUSPAR) 5 MG tablet, TAKE ONE TABLET BY MOUTH TWICE A DAY AS NEEDED FOR ANXIETY, Disp: 90 tablet, Rfl: 2   Capsaicin 0.1 % CREA, Use gloves to apply to both feet according to package instructions for foot pain., Disp: 56.6 g, Rfl: 0   DULoxetine (CYMBALTA) 30 MG capsule, Take 1 capsule (30 mg total) by mouth daily., Disp: 30  capsule, Rfl: 3   ELIQUIS 5 MG TABS tablet, TAKE ONE TABLET BY MOUTH TWICE A DAY, Disp: 60 tablet, Rfl: 10   escitalopram (LEXAPRO) 20 MG tablet, TAKE 1 TABLET BY MOUTH DAILY, Disp: 30 tablet, Rfl: 5   furosemide (LASIX) 40 MG tablet, TAKE 1-2 TABELTS BY MOUTH TWO TIMES A DAY, Disp: 120 tablet, Rfl: 4   gabapentin (NEURONTIN) 100 MG capsule, TAKE 3 CAPSULES BY MOUTH AT BEDTIME, Disp: 90 capsule, Rfl: 5   KLOR-CON M20 20 MEQ tablet, TAKE ONE TABLET BY MOUTH TWICE A DAY, Disp: 60 tablet, Rfl: 2   levothyroxine (SYNTHROID) 150 MCG tablet, TAKE ONE TABLET BY MOUTH DAILY, Disp: 90 tablet, Rfl: 0   meloxicam (MOBIC) 7.5 MG tablet, Take 1 tablet (7.5 mg total) by mouth daily., Disp: 30 tablet, Rfl: 0   metFORMIN (GLUCOPHAGE) 500 MG tablet, Take 1 tablet (500 mg total) by mouth daily with breakfast., Disp: 90 tablet, Rfl: 3   montelukast (SINGULAIR) 10 MG tablet, Take 1 tablet (10 mg total) by mouth daily., Disp: 30 tablet, Rfl: 11   Multiple Vitamins-Minerals (MULTIVITAMINS THER. W/MINERALS) TABS, Take 1 tablet by mouth daily., Disp: , Rfl:    nortriptyline (PAMELOR) 50 MG capsule, Take 1 capsule (50 mg total) by mouth at bedtime., Disp: 90 capsule, Rfl: 1   predniSONE (DELTASONE) 10 MG tablet, 4 tabs for 2 days, then 3 tabs for 2 days, 2 tabs for 2 days, then 1 tab for 2 days, then stop (Patient not taking: Reported on 05/10/2020), Disp: 20 tablet, Rfl: 0   pregabalin (LYRICA) 75 MG  capsule, TAKE ONE CAPSULE BY MOUTH TWICE A DAY, Disp: 60 capsule, Rfl: 0   propranolol (INDERAL) 10 MG tablet, TAKE ONE TABLET BY MOUTH TWICE A DAY, Disp: 120 tablet, Rfl: 0   Spacer/Aero-Holding Chambers (AEROCHAMBER MV) inhaler, Use as instructed, Disp: 1 each, Rfl: 0   traMADol (ULTRAM) 50 MG tablet, TAKE 1 TABLET BY MOUTH EVERY 12 HOURS AS NEEDED FOR MODERATE PAIN, Disp: 30 tablet, Rfl: 2   traZODone (DESYREL) 50 MG tablet, TAKE 1/2 TO 1 TABLET BY MOUTH AT BEDTIME AS NEEDED FOR SLEEP, Disp: 90 tablet, Rfl:  2  Past Medical History: Past Medical History:  Diagnosis Date   Allergy    States his nose runs when he is around grease.    Anxiety    Arthritis    Atrial fibrillation (HCC)    Cancer ([place])    Coronary artery disease    History of total knee replacement    Hypertension    Hypothyroidism    Kidney cancer, primary, with metastasis from kidney to other site [facility])    Kidney stone    OSA (obstructive sleep apnea)    pt does not wear cpap at night   Pituitary cyst ([place])    Pneumonia    hx of   Prostate cancer ([place])     Tobacco Use: Social History   Tobacco Use  Smoking Status Never Smoker  Smokeless Tobacco Never Used    Labs: Recent Review Flowsheet Data    Labs for ITP Cardiac and Pulmonary Rehab Latest Ref Rng & Units 02/22/2018 05/10/2018 07/14/2019 07/14/2019 07/31/2019   Cholestrol 0 - 200 mg/dL - - - - -   LDLCALC 0 - 99 mg/dL - - - - -   LDLDIRECT mg/dL - - - - -   HDL >39.00 mg/dL - - - - -   Trlycerides <150 mg/dL - - - - -   Hemoglobin A1c 4.6 - 6.5 % - 5.9 - - 6.3   PHART 7.350 - 7.450 7.441 - - 7.353 -   PCO2ART 32.0 - 48.0 mmHg 44.1 - - 56.1(H) -   HCO3 20.0 - 28.0 mmol/L 29.5(H) - 32.0(H) 31.2(H) -   TCO2 22 - 32 mmol/L 31 - 34(H) 33(H) -   ACIDBASEDEF 0.0 - 2.0 mmol/L - - - - -   O2SAT % 95.0 - 72.0 96.0 -      Capillary Blood Glucose: Lab Results  Component Value Date   GLUCAP 126 (H) 05/28/2020   GLUCAP 152 (H) 05/16/2020   GLUCAP 133 (H) 05/16/2020   GLUCAP 78 05/14/2020   GLUCAP 102 (H) 05/14/2020    POCT Glucose    Row Name 05/14/20 1454             POCT Blood Glucose   Pre-Exercise 102 mg/dL       Post-Exercise 78 mg/dL              Pulmonary Assessment Scores:  Pulmonary Assessment Scores    Row Name 05/10/20 1639         ADL UCSD   SOB Score total 107           CAT Score   CAT Score 30           mMRC Score   mMRC Score 4           UCSD: Self-administered rating of dyspnea associated with  activities of daily living (ADLs) 6-point scale (0 = "not at all" to 5 = "maximal  or unable to do because of breathlessness")  Scoring Scores range from 0 to 120.  Minimally important difference is 5 units  CAT: CAT can identify the health impairment of COPD patients and is better correlated with disease progression.  CAT has a scoring range of zero to 40. The CAT score is classified into four groups of low (less than 10), medium (10 - 20), high (21-30) and very high (31-40) based on the impact level of disease on health status. A CAT score over 10 suggests significant symptoms.  A worsening CAT score could be explained by an exacerbation, poor medication adherence, poor inhaler technique, or progression of COPD or comorbid conditions.  CAT MCID is 2 points  mMRC: mMRC (Modified Medical Research Council) Dyspnea Scale is used to assess the degree of baseline functional disability in patients of respiratory disease due to dyspnea. No minimal important difference is established. A decrease in score of 1 point or greater is considered a positive change.   Pulmonary Function Assessment:  Pulmonary Function Assessment - 05/10/20 1430      Breath   Shortness of Breath Yes;Limiting activity;Fear of Shortness of Breath           Exercise Target Goals: Exercise Program Goal: Individual exercise prescription set using results from initial 6 min walk test and THRR while considering  patients activity barriers and safety.   Exercise Prescription Goal: Initial exercise prescription builds to 30-45 minutes a day of aerobic activity, 2-3 days per week.  Home exercise guidelines will be given to patient during program as part of exercise prescription that the participant will acknowledge.  Activity Barriers & Risk Stratification:  Activity Barriers & Cardiac Risk Stratification - 05/10/20 1429      Activity Barriers & Cardiac Risk Stratification   Activity Barriers Left Knee Replacement;Right Knee  Replacement;Shortness of Breath;Deconditioning    Cardiac Risk Stratification High           6 Minute Walk:  6 Minute Walk    Row Name 05/10/20 1602         6 Minute Walk   Phase Initial     Distance 400 feet     Walk Time 6 minutes     # of Rest Breaks 1  Took a seated break from minute 2 until minute 4, due to SOB     MPH 0.76     METS 0.14     RPE 15     Perceived Dyspnea  3     VO2 Peak 0.48     Symptoms Yes (comment)     Comments Severe shortness of breath caused him to take a seated break that lasted for 2 minutes     Resting HR 77 bpm     Resting BP 112/70     Resting Oxygen Saturation  97 %     Exercise Oxygen Saturation  during 6 min walk 90 %     Max Ex. HR 102 bpm     Max Ex. BP 146/74     2 Minute Post BP 136/72           Interval HR   1 Minute HR 93     2 Minute HR 102     3 Minute HR 91     4 Minute HR 87     5 Minute HR 93     6 Minute HR 90     2 Minute Post HR 82     Interval Heart Rate?  Yes           Interval Oxygen   Interval Oxygen? Yes     Baseline Oxygen Saturation % 97 %     1 Minute Oxygen Saturation % 95 %     1 Minute Liters of Oxygen 4 L     2 Minute Oxygen Saturation % 90 %     2 Minute Liters of Oxygen 4 L     3 Minute Oxygen Saturation % 91 %     3 Minute Liters of Oxygen 4 L     4 Minute Oxygen Saturation % 96 %     4 Minute Liters of Oxygen 4 L     5 Minute Oxygen Saturation % 95 %     5 Minute Liters of Oxygen 4 L     6 Minute Oxygen Saturation % 92 %     6 Minute Liters of Oxygen 4 L     2 Minute Post Oxygen Saturation % 98 %     2 Minute Post Liters of Oxygen 4 L            Oxygen Initial Assessment:  Oxygen Initial Assessment - 05/10/20 1600      Home Oxygen   Home Oxygen Device Home Concentrator;Portable Concentrator    Sleep Oxygen Prescription Continuous    Liters per minute 5    Home Exercise Oxygen Prescription Pulsed    Liters per minute 5    Home Resting Oxygen Prescription None    Compliance with  Home Oxygen Use No    Comments --   Pt only uses oxygen when he feels he needs it     Initial 6 min Walk   Oxygen Used Continuous    Liters per minute 4      Program Oxygen Prescription   Program Oxygen Prescription Continuous    Liters per minute 4      Intervention   Short Term Goals To learn and exhibit compliance with exercise, home and travel O2 prescription;To learn and understand importance of monitoring SPO2 with pulse oximeter and demonstrate accurate use of the pulse oximeter.;To learn and understand importance of maintaining oxygen saturations>88%;To learn and demonstrate proper pursed lip breathing techniques or other breathing techniques.;To learn and demonstrate proper use of respiratory medications    Long  Term Goals Exhibits compliance with exercise, home and travel O2 prescription;Verbalizes importance of monitoring SPO2 with pulse oximeter and return demonstration;Maintenance of O2 saturations>88%;Exhibits proper breathing techniques, such as pursed lip breathing or other method taught during program session;Compliance with respiratory medication;Demonstrates proper use of MDIs           Oxygen Re-Evaluation:  Oxygen Re-Evaluation    Row Name 05/16/20 0729 06/11/20 0822           Program Oxygen Prescription   Program Oxygen Prescription Continuous Continuous      Liters per minute 4 4             Home Oxygen   Home Oxygen Device Home Concentrator;Portable Concentrator Home Concentrator;Portable Concentrator      Sleep Oxygen Prescription Continuous Continuous      Liters per minute 5 5      Home Exercise Oxygen Prescription Pulsed Pulsed      Liters per minute 5 5      Home Resting Oxygen Prescription None Pulsed      Liters per minute -- 4      Compliance with Home Oxygen Use No No  Goals/Expected Outcomes   Short Term Goals To learn and exhibit compliance with exercise, home and travel O2 prescription;To learn and understand importance of  monitoring SPO2 with pulse oximeter and demonstrate accurate use of the pulse oximeter.;To learn and understand importance of maintaining oxygen saturations>88%;To learn and demonstrate proper pursed lip breathing techniques or other breathing techniques.;To learn and demonstrate proper use of respiratory medications To learn and exhibit compliance with exercise, home and travel O2 prescription;To learn and understand importance of monitoring SPO2 with pulse oximeter and demonstrate accurate use of the pulse oximeter.;To learn and understand importance of maintaining oxygen saturations>88%;To learn and demonstrate proper pursed lip breathing techniques or other breathing techniques.;To learn and demonstrate proper use of respiratory medications      Long  Term Goals Exhibits compliance with exercise, home and travel O2 prescription;Verbalizes importance of monitoring SPO2 with pulse oximeter and return demonstration;Maintenance of O2 saturations>88%;Exhibits proper breathing techniques, such as pursed lip breathing or other method taught during program session;Compliance with respiratory medication;Demonstrates proper use of MDIs Exhibits compliance with exercise, home and travel O2 prescription;Verbalizes importance of monitoring SPO2 with pulse oximeter and return demonstration;Maintenance of O2 saturations>88%;Exhibits proper breathing techniques, such as pursed lip breathing or other method taught during program session;Compliance with respiratory medication;Demonstrates proper use of MDIs      Comments -- Pt is not compliant with oxygen. He states he only uses it when he feels like he needs it.      Goals/Expected Outcomes compliance and understanding of oxygen saturation and pursed lip breathing Compliance and understanding of oxygen saturation and pursed lip breathing. Compliance wtih oxygen prescription.             Oxygen Discharge (Final Oxygen Re-Evaluation):  Oxygen Re-Evaluation -  06/11/20 0822      Program Oxygen Prescription   Program Oxygen Prescription Continuous    Liters per minute 4      Home Oxygen   Home Oxygen Device Home Concentrator;Portable Concentrator    Sleep Oxygen Prescription Continuous    Liters per minute 5    Home Exercise Oxygen Prescription Pulsed    Liters per minute 5    Home Resting Oxygen Prescription Pulsed    Liters per minute 4    Compliance with Home Oxygen Use No      Goals/Expected Outcomes   Short Term Goals To learn and exhibit compliance with exercise, home and travel O2 prescription;To learn and understand importance of monitoring SPO2 with pulse oximeter and demonstrate accurate use of the pulse oximeter.;To learn and understand importance of maintaining oxygen saturations>88%;To learn and demonstrate proper pursed lip breathing techniques or other breathing techniques.;To learn and demonstrate proper use of respiratory medications    Long  Term Goals Exhibits compliance with exercise, home and travel O2 prescription;Verbalizes importance of monitoring SPO2 with pulse oximeter and return demonstration;Maintenance of O2 saturations>88%;Exhibits proper breathing techniques, such as pursed lip breathing or other method taught during program session;Compliance with respiratory medication;Demonstrates proper use of MDIs    Comments Pt is not compliant with oxygen. He states he only uses it when he feels like he needs it.    Goals/Expected Outcomes Compliance and understanding of oxygen saturation and pursed lip breathing. Compliance wtih oxygen prescription.           Initial Exercise Prescription:  Initial Exercise Prescription - 05/10/20 1600      Date of Initial Exercise RX and Referring Provider   Date 05/10/20    Referring Provider Dr. Elsworth Soho  Expected Discharge Date 07/11/20      Oxygen   Oxygen Continuous    Liters 4      NuStep   Level 1    Minutes 30      Prescription Details   Frequency (times per week)  2    Duration Progress to 30 minutes of continuous aerobic without signs/symptoms of physical distress      Intensity   THRR 40-80% of Max Heartrate 57-114    Ratings of Perceived Exertion 11-13    Perceived Dyspnea 0-4      Progression   Progression Continue to progress workloads to maintain intensity without signs/symptoms of physical distress.      Resistance Training   Training Prescription Yes    Weight Blue bands    Reps 10-15           Perform Capillary Blood Glucose checks as needed.  Exercise Prescription Changes:  Exercise Prescription Changes    Row Name 05/14/20 1400 05/28/20 1500           Response to Exercise   Blood Pressure (Admit) 110/70 136/72      Blood Pressure (Exercise) 100/60 132/76      Blood Pressure (Exit) 114/60 132/68      Heart Rate (Admit) 82 bpm 86 bpm      Heart Rate (Exercise) 96 bpm 99 bpm      Heart Rate (Exit) 88 bpm 94 bpm      Oxygen Saturation (Admit) 97 % 97 %      Oxygen Saturation (Exercise) 94 % 94 %      Oxygen Saturation (Exit) 97 % 97 %      Rating of Perceived Exertion (Exercise) 12 13      Perceived Dyspnea (Exercise) 2 2      Duration Continue with 30 min of aerobic exercise without signs/symptoms of physical distress. Continue with 30 min of aerobic exercise without signs/symptoms of physical distress.      Intensity Other (comment)  40-80% HR max THRR unchanged             Progression   Progression Continue to progress workloads to maintain intensity without signs/symptoms of physical distress. Continue to progress workloads to maintain intensity without signs/symptoms of physical distress.             Resistance Training   Training Prescription Yes Yes      Weight Blue bands Blue bands      Reps 10-15 10-15      Time 10 Minutes 10 Minutes             Oxygen   Oxygen Continuous Continuous      Liters 4 4             NuStep   Level 1 2      SPM -- 80      Minutes 30 30      METs 1.8 1.7              Exercise Comments:  Exercise Comments    Row Name 05/14/20 1454           Exercise Comments Pt completed first day of exercise and tolerated well with no complaints. He is pre-diabetic and on metformin, his pre exercise glucose was 102 and post glucose was 78. Will continue to monitor.              Exercise Goals and Review:  Exercise Goals    Row Name  05/10/20 1601             Exercise Goals   Increase Physical Activity Yes       Intervention Provide advice, education, support and counseling about physical activity/exercise needs.;Develop an individualized exercise prescription for aerobic and resistive training based on initial evaluation findings, risk stratification, comorbidities and participant's personal goals.       Expected Outcomes Short Term: Attend rehab on a regular basis to increase amount of physical activity.;Long Term: Add in home exercise to make exercise part of routine and to increase amount of physical activity.;Long Term: Exercising regularly at least 3-5 days a week.       Increase Strength and Stamina Yes       Intervention Provide advice, education, support and counseling about physical activity/exercise needs.;Develop an individualized exercise prescription for aerobic and resistive training based on initial evaluation findings, risk stratification, comorbidities and participant's personal goals.       Expected Outcomes Short Term: Increase workloads from initial exercise prescription for resistance, speed, and METs.;Short Term: Perform resistance training exercises routinely during rehab and add in resistance training at home;Long Term: Improve cardiorespiratory fitness, muscular endurance and strength as measured by increased METs and functional capacity (6MWT)       Able to understand and use rate of perceived exertion (RPE) scale Yes       Intervention Provide education and explanation on how to use RPE scale       Expected Outcomes Short Term: Able  to use RPE daily in rehab to express subjective intensity level;Long Term:  Able to use RPE to guide intensity level when exercising independently       Able to understand and use Dyspnea scale Yes       Intervention Provide education and explanation on how to use Dyspnea scale       Expected Outcomes Short Term: Able to use Dyspnea scale daily in rehab to express subjective sense of shortness of breath during exertion;Long Term: Able to use Dyspnea scale to guide intensity level when exercising independently       Knowledge and understanding of Target Heart Rate Range (THRR) Yes       Intervention Provide education and explanation of THRR including how the numbers were predicted and where they are located for reference       Expected Outcomes Short Term: Able to state/look up THRR;Long Term: Able to use THRR to govern intensity when exercising independently;Short Term: Able to use daily as guideline for intensity in rehab       Understanding of Exercise Prescription Yes       Intervention Provide education, explanation, and written materials on patient's individual exercise prescription       Expected Outcomes Short Term: Able to explain program exercise prescription;Long Term: Able to explain home exercise prescription to exercise independently              Exercise Goals Re-Evaluation :  Exercise Goals Re-Evaluation    Row Name 05/16/20 0727 06/11/20 0820           Exercise Goal Re-Evaluation   Exercise Goals Review Increase Physical Activity;Increase Strength and Stamina;Able to understand and use rate of perceived exertion (RPE) scale;Able to understand and use Dyspnea scale;Knowledge and understanding of Target Heart Rate Range (THRR);Understanding of Exercise Prescription Increase Physical Activity;Increase Strength and Stamina;Able to understand and use rate of perceived exertion (RPE) scale;Able to understand and use Dyspnea scale;Knowledge and understanding of Target Heart Rate Range  (THRR);Understanding  of Exercise Prescription      Comments Pt has completed one exercise session and tolerated well so far with no complaints or concerns. He is exercising at 1.8 METS on the Nustep. Will continue to monitor and progress as he is able. Pt has completed 4 exercise sessions. He has made progression with workload increases so far but not METS. He is dependent on staff helping him with resistance band exercises and helping him get on and off of exercise equipment. Goal is for pt to be more independent with exercise. He is exercising at 1.8 METS on the Nustep. Will continue to monitor and progress as he is able.      Expected Outcomes Through exercise at rehab and at home, the patient will decrease shortness of breath with daily activities and feel confident in carrying out an exercise regime at home. Through exercise at rehab and at home, the patient will decrease shortness of breath with daily activities and feel confident in carrying out an exercise regime at home.             Discharge Exercise Prescription (Final Exercise Prescription Changes):  Exercise Prescription Changes - 05/28/20 1500      Response to Exercise   Blood Pressure (Admit) 136/72    Blood Pressure (Exercise) 132/76    Blood Pressure (Exit) 132/68    Heart Rate (Admit) 86 bpm    Heart Rate (Exercise) 99 bpm    Heart Rate (Exit) 94 bpm    Oxygen Saturation (Admit) 97 %    Oxygen Saturation (Exercise) 94 %    Oxygen Saturation (Exit) 97 %    Rating of Perceived Exertion (Exercise) 13    Perceived Dyspnea (Exercise) 2    Duration Continue with 30 min of aerobic exercise without signs/symptoms of physical distress.    Intensity THRR unchanged      Progression   Progression Continue to progress workloads to maintain intensity without signs/symptoms of physical distress.      Resistance Training   Training Prescription Yes    Weight Blue bands    Reps 10-15    Time 10 Minutes      Oxygen   Oxygen  Continuous    Liters 4      NuStep   Level 2    SPM 80    Minutes 30    METs 1.7           Nutrition:  Target Goals: Understanding of nutrition guidelines, daily intake of sodium <1560m, cholesterol <2014m calories 30% from fat and 7% or less from saturated fats, daily to have 5 or more servings of fruits and vegetables.  Biometrics:  Pre Biometrics - 05/10/20 1602      Pre Biometrics   Grip Strength 25 kg            Nutrition Therapy Plan and Nutrition Goals:   Nutrition Assessments:  MEDIFICTS Score Key:  ?70 Need to make dietary changes   40-70 Heart Healthy Diet  ? 40 Therapeutic Level Cholesterol Diet   Picture Your Plate Scores:  <4<95nhealthy dietary pattern with much room for improvement.  41-50 Dietary pattern unlikely to meet recommendations for good health and room for improvement.  51-60 More healthful dietary pattern, with some room for improvement.   >60 Healthy dietary pattern, although there may be some specific behaviors that could be improved.    Nutrition Goals Re-Evaluation:  Nutrition Goals Re-Evaluation    RoRodessaame 05/16/20 [id number]  Goals   Current Weight 281 lb 12 oz (127.8 kg)              Nutrition Goals Discharge (Final Nutrition Goals Re-Evaluation):  Nutrition Goals Re-Evaluation - 05/16/20 0831      Goals   Current Weight 281 lb 12 oz (127.8 kg)           Psychosocial: Target Goals: Acknowledge presence or absence of significant depression and/or stress, maximize coping skills, provide positive support system. Participant is able to verbalize types and ability to use techniques and skills needed for reducing stress and depression.  Initial Review & Psychosocial Screening:  Initial Psych Review & Screening - 05/10/20 1431      Initial Review   Current issues with None Identified      Family Dynamics   Good Support System? Yes   Wife and son live with them. They are both supportive      Barriers   Psychosocial barriers to participate in program The patient should benefit from training in stress management and relaxation.      Screening Interventions   Interventions Encouraged to exercise           Quality of Life Scores:  Scores of 19 and below usually indicate a poorer quality of life in these areas.  A difference of  2-3 points is a clinically meaningful difference.  A difference of 2-3 points in the total score of the Quality of Life Index has been associated with significant improvement in overall quality of life, self-image, physical symptoms, and general health in studies assessing change in quality of life.  PHQ-9: Recent Review Flowsheet Data    Depression screen [facility] 2/9 05/10/2020 12/13/2019 07/31/2019 06/21/2019 05/05/2019   Decreased Interest 2 0 0 0 3   Down, Depressed, Hopeless 0 0 0 0 0   PHQ - 2 Score 2 0 0 0 3   Altered sleeping 3 0 0 0 3   Tired, decreased energy 3 0 0 1 3   Change in appetite 0 0 0 0 3   Feeling bad or failure about yourself  0 0 0 0 0   Trouble concentrating 0 0 0 0 1   Moving slowly or fidgety/restless 0 0 0 0 0   Suicidal thoughts 0 0 0 0 -   PHQ-9 Score 8 0 0 1 13   Difficult doing work/chores Very difficult Not difficult at all Not difficult at all Not difficult at all -     Interpretation of Total Score  Total Score Depression Severity:  1-4 = Minimal depression, 5-9 = Mild depression, 10-14 = Moderate depression, 15-19 = Moderately severe depression, 20-27 = Severe depression   Psychosocial Evaluation and Intervention:   Psychosocial Re-Evaluation:  Psychosocial Re-Evaluation    Row Name 05/20/20 1329 06/06/20 1105           Psychosocial Re-Evaluation   Current issues with None Identified Current Stress Concerns      Comments No psychosocial concerns identified at this time. Has had some issues with feeling anxious due to dealing with chronic illness.      Expected Outcomes For [name] to continue to be free of  psychsocial concerns while participating in pulmonary rehab. Teach patient how to deal with stress in positive ways.      Interventions -- Stress management education;Encouraged to attend Pulmonary Rehabilitation for the exercise      Continue Psychosocial Services  No Follow up required Follow up required by staff  Initial Review   Source of Stress Concerns -- Chronic Illness             Psychosocial Discharge (Final Psychosocial Re-Evaluation):  Psychosocial Re-Evaluation - 06/06/20 1105      Psychosocial Re-Evaluation   Current issues with Current Stress Concerns    Comments Has had some issues with feeling anxious due to dealing with chronic illness.    Expected Outcomes Teach patient how to deal with stress in positive ways.    Interventions Stress management education;Encouraged to attend Pulmonary Rehabilitation for the exercise    Continue Psychosocial Services  Follow up required by staff      Initial Review   Source of Stress Concerns Chronic Illness           Education: Education Goals: Education classes will be provided on a weekly basis, covering required topics. Participant will state understanding/return demonstration of topics presented.  Learning Barriers/Preferences:  Learning Barriers/Preferences - 05/10/20 1433      Learning Barriers/Preferences   Learning Barriers None    Learning Preferences Verbal Instruction           Education Topics: Risk Factor Reduction:  -Group instruction that is supported by a PowerPoint presentation. Instructor discusses the definition of a risk factor, different risk factors for pulmonary disease, and how the heart and lungs work together.     Nutrition for Pulmonary Patient:  -Group instruction provided by PowerPoint slides, verbal discussion, and written materials to support subject matter. The instructor gives an explanation and review of healthy diet recommendations, which includes a discussion on weight  management, recommendations for fruit and vegetable consumption, as well as protein, fluid, caffeine, fiber, sodium, sugar, and alcohol. Tips for eating when patients are short of breath are discussed.   Pursed Lip Breathing:  -Group instruction that is supported by demonstration and informational handouts. Instructor discusses the benefits of pursed lip and diaphragmatic breathing and detailed demonstration on how to preform both.   Flowsheet Row PULMONARY REHAB OTHER RESPIRATORY from 12/19/2019 in [facility]  Date 12/19/19  Educator Handout      Oxygen Safety:  -Group instruction provided by PowerPoint, verbal discussion, and written material to support subject matter. There is an overview of What is Oxygen and Why do we need it.  Instructor also reviews how to create a safe environment for oxygen use, the importance of using oxygen as prescribed, and the risks of noncompliance. There is a brief discussion on traveling with oxygen and resources the patient may utilize.   Oxygen Equipment:  -Group instruction provided by [facility] Staff utilizing handouts, written materials, and equipment demonstrations.   Signs and Symptoms:  -Group instruction provided by written material and verbal discussion to support subject matter. Warning signs and symptoms of infection, stroke, and heart attack are reviewed and when to call the physician/911 reinforced. Tips for preventing the spread of infection discussed.   Advanced Directives:  -Group instruction provided by verbal instruction and written material to support subject matter. Instructor reviews Advanced Directive laws and proper instruction for filling out document.   Pulmonary Video:  -Group video education that reviews the importance of medication and oxygen compliance, exercise, good nutrition, pulmonary hygiene, and pursed lip and diaphragmatic breathing for the pulmonary patient.   Exercise for the  Pulmonary Patient:  -Group instruction that is supported by a PowerPoint presentation. Instructor discusses benefits of exercise, core components of exercise, frequency, duration, and intensity of an exercise routine, importance of utilizing pulse  oximetry during exercise, safety while exercising, and options of places to exercise outside of rehab.     Pulmonary Medications:  -Verbally interactive group education provided by instructor with focus on inhaled medications and proper administration.   Anatomy and Physiology of the Respiratory System and Intimacy:  -Group instruction provided by PowerPoint, verbal discussion, and written material to support subject matter. Instructor reviews respiratory cycle and anatomical components of the respiratory system and their functions. Instructor also reviews differences in obstructive and restrictive respiratory diseases with examples of each. Intimacy, Sex, and Sexuality differences are reviewed with a discussion on how relationships can change when diagnosed with pulmonary disease. Common sexual concerns are reviewed.   MD DAY -A group question and answer session with a medical doctor that allows participants to ask questions that relate to their pulmonary disease state.   OTHER EDUCATION -Group or individual verbal, written, or video instructions that support the educational goals of the pulmonary rehab program. Cucumber from 05/30/2020 in [place]  Date 05/30/20  Educator Handout  [[facility] [place]  Instruction Review Code 1- Verbalizes Understanding      Holiday Eating Survival Tips:  -Group instruction provided by PowerPoint slides, verbal discussion, and written materials to support subject matter. The instructor gives patients tips, tricks, and techniques to help them not only survive but enjoy the holidays despite the onslaught of food that accompanies the  holidays.   Knowledge Questionnaire Score:  Knowledge Questionnaire Score - 05/10/20 1641      Knowledge Questionnaire Score   Pre Score 17/18           Core Components/Risk Factors/Patient Goals at Admission:  Personal Goals and Risk Factors at Admission - 05/10/20 1435      Core Components/Risk Factors/Patient Goals on Admission    Weight Management Obesity;Yes    Improve shortness of breath with ADL's Yes    Intervention Provide education, individualized exercise plan and daily activity instruction to help decrease symptoms of SOB with activities of daily living.    Expected Outcomes Short Term: Improve cardiorespiratory fitness to achieve a reduction of symptoms when performing ADLs;Long Term: Be able to perform more ADLs without symptoms or delay the onset of symptoms           Core Components/Risk Factors/Patient Goals Review:   Goals and Risk Factor Review    Row Name 05/20/20 1330 06/06/20 1111           Core Components/Risk Factors/Patient Goals Review   Personal Goals Review Improve shortness of breath with ADL's;Develop more efficient breathing techniques such as purse lipped breathing and diaphragmatic breathing and practicing self-pacing with activity.;Increase knowledge of respiratory medications and ability to use respiratory devices properly. Increase knowledge of respiratory medications and ability to use respiratory devices properly.;Develop more efficient breathing techniques such as purse lipped breathing and diaphragmatic breathing and practicing self-pacing with activity.;Improve shortness of breath with ADL's      Review [name] is very deconditioned, he just started the program and has attended 2 exercise sessions, too early to see progression toward goals. 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  1.8 met level on nustep.  Not sure if he is progressing since starting the program 3 weeks ago.      Expected Outcomes To see [name] progress in the next  full 30 days in his strength, stamina, and shortness of breath. For [name] to make slow progress in strength and endurance.  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  1.8 met level on nustep.  Not sure if he is progressing since starting the program 3 weeks ago.    Expected Outcomes For [name] to make slow progress in strength and endurance.           ITP Comments:   Comments: ITP REVIEW Pt is making expected progress toward pulmonary rehab goals after completing 4 sessions. Recommend continued exercise, life style modification, education, and utilization of breathing techniques to increase stamina and strength and decrease shortness of breath with exertion.

## 2020-06-13 ENCOUNTER — Encounter (HOSPITAL_COMMUNITY): Payer: Medicare Other

## 2020-06-13 ENCOUNTER — Telehealth (HOSPITAL_COMMUNITY): Payer: Self-pay | Admitting: *Deleted

## 2020-06-14 ENCOUNTER — Telehealth (HOSPITAL_COMMUNITY): Payer: Self-pay | Admitting: Family Medicine

## 2020-06-17 ENCOUNTER — Telehealth (HOSPITAL_COMMUNITY): Payer: Self-pay | Admitting: *Deleted

## 2020-06-18 ENCOUNTER — Encounter (HOSPITAL_COMMUNITY)
Admission: RE | Admit: 2020-06-18 | Discharge: 2020-06-18 | Disposition: A | Discharge status: Home / Self Care | Payer: Medicare Other | Source: Ambulatory Visit | Attending: Pulmonary Disease | Admitting: Pulmonary Disease

## 2020-06-18 ENCOUNTER — Other Ambulatory Visit: Payer: Self-pay

## 2020-06-18 VITALS — Wt 277.3 lb

## 2020-06-18 DIAGNOSIS — J849 Interstitial pulmonary disease, unspecified: Secondary | ICD-10-CM

## 2020-06-18 NOTE — Progress Notes (Signed)
Daily Session Note  Patient Details  Name: Wayne Booth MRN: [id number] Date of Birth: 05/07/1941 Referring Provider:   Flowsheet Row Pulmonary Rehab Walk Test from 05/10/2020 in [facility]  Referring Provider Dr. Alva      Encounter Date: 06/18/2020  Check In:  Session Check In - 06/18/20 1337      Check-In   Supervising physician immediately available to respond to emergencies [facility] immediately available    Physician(s) Dr. Gherghe    Location [facility]    Staff Present Joan Behrens, RN, BSN; , MS, ACSM-CEP, Exercise Physiologist;Lisa Hughes, RN    Virtual Visit No    Medication changes reported     No    Fall or balance concerns reported    No    Tobacco Cessation No Change    Warm-up and Cool-down Performed on first and last piece of equipment    Resistance Training Performed Yes    VAD Patient? No    PAD/SET Patient? No      Pain Assessment   Currently in Pain? No/denies    Multiple Pain Sites No           Capillary Blood Glucose: No results found for this or any previous visit (from the past 24 hour(s)).    Social History   Tobacco Use  Smoking Status Never Smoker  Smokeless Tobacco Never Used    Goals Met:  Proper associated with RPD/PD & O2 Sat Exercise tolerated well No report of cardiac concerns or symptoms Strength training completed today  Goals Unmet:  Not Applicable  Comments: Service time is from 1330 to 1425    Dr. Traci Turner is Medical Director for Cardiac Rehab at [facility]. 

## 2020-06-20 ENCOUNTER — Encounter (HOSPITAL_COMMUNITY): Payer: Medicare Other

## 2020-06-20 ENCOUNTER — Telehealth: Payer: Self-pay | Admitting: *Deleted

## 2020-06-20 NOTE — Telephone Encounter (Signed)
Called and left a message for the patient to call us back to discuss the medicine change. Wayne Booth

## 2020-06-25 ENCOUNTER — Encounter (HOSPITAL_COMMUNITY): Payer: Medicare Other

## 2020-06-27 ENCOUNTER — Encounter (HOSPITAL_COMMUNITY): Payer: Medicare Other

## 2020-07-01 ENCOUNTER — Other Ambulatory Visit: Payer: Self-pay | Admitting: Adult Health

## 2020-07-02 ENCOUNTER — Encounter (HOSPITAL_COMMUNITY): Payer: Medicare Other

## 2020-07-03 ENCOUNTER — Telehealth: Payer: Self-pay

## 2020-07-03 ENCOUNTER — Other Ambulatory Visit: Payer: Self-pay | Admitting: Family Medicine

## 2020-07-03 MED ORDER — TRAZODONE HCL 50 MG PO TABS
ORAL_TABLET | ORAL | 2 refills | Status: DC
Start: 2020-07-03 — End: 2020-12-26

## 2020-07-03 NOTE — Telephone Encounter (Signed)
Medication sent to pharmacy  

## 2020-07-03 NOTE — Telephone Encounter (Signed)
.   PATIENT STATES HE LEFT MEDICATION IN A HOTEL WHEN TRAVELING AND WOULD LIKE A REFILL   LAST APPOINTMENT DATE: 06/10/2020   NEXT APPOINTMENT DATE:@Visit  date not found  MEDICATION:  traZODone (DESYREL) 50 MG tablet  PHARMACY:[facility] [address] [place], [place] - [address]  **Let patient know to contact pharmacy at the end of the day to make sure medication is ready. **  ** Please notify patient to allow 48-72 hours to process**  **Encourage patient to contact the pharmacy for refills or they can request refills through [facility]**  CLINICAL FILLS OUT ALL BELOW:   LAST REFILL:  QTY:  REFILL DATE:    OTHER COMMENTS:    Okay for refill?  Please advise

## 2020-07-04 ENCOUNTER — Encounter (HOSPITAL_COMMUNITY): Payer: Medicare Other

## 2020-07-04 DIAGNOSIS — G4733 Obstructive sleep apnea (adult) (pediatric): Secondary | ICD-10-CM | POA: Diagnosis not present

## 2020-07-08 ENCOUNTER — Other Ambulatory Visit: Payer: Self-pay | Admitting: Family Medicine

## 2020-07-09 ENCOUNTER — Encounter (HOSPITAL_COMMUNITY)
Admission: RE | Admit: 2020-07-09 | Discharge: 2020-07-09 | Disposition: A | Discharge status: Home / Self Care | Payer: Medicare Other | Source: Ambulatory Visit | Attending: Pulmonary Disease | Admitting: Pulmonary Disease

## 2020-07-09 DIAGNOSIS — J849 Interstitial pulmonary disease, unspecified: Secondary | ICD-10-CM | POA: Insufficient documentation

## 2020-07-09 NOTE — Progress Notes (Signed)
Pulmonary Individual Treatment Plan  Patient Details  Name: Wayne Booth MRN: [id number] Date of Birth: 02-08-1941 Referring Provider:   April Manson Pulmonary Rehab Walk Test from 05/10/2020 in [place]  Referring Provider Dr. Elsworth Soho      Initial Encounter Date:  Flowsheet Row Pulmonary Rehab Walk Test from 05/10/2020 in [place]  Date 05/10/20      Visit Diagnosis: ILD (interstitial lung disease) ([place])  Patient's Home Medications on Admission:   Current Outpatient Medications:  .  albuterol (PROVENTIL) (2.5 MG/3ML) 0.083% nebulizer solution, Take 3 mLs (2.5 mg total) by nebulization every 6 (six) hours as needed for wheezing or shortness of breath., Disp: 75 mL, Rfl: 3 .  albuterol (VENTOLIN HFA) 108 (90 Base) MCG/ACT inhaler, Inhale 2 puffs into the lungs every 6 (six) hours as needed for wheezing or shortness of breath., Disp: 18 g, Rfl: 2 .  Budeson-Glycopyrrol-Formoterol (BREZTRI AEROSPHERE) 160-9-4.8 MCG/ACT AERO, Inhale 2 puffs into the lungs in the morning and at bedtime., Disp: 10.7 g, Rfl: 4 .  Budeson-Glycopyrrol-Formoterol (BREZTRI AEROSPHERE) 160-9-4.8 MCG/ACT AERO, Inhale 2 puffs into the lungs in the morning and at bedtime., Disp: 10.7 g, Rfl: 5 .  Budeson-Glycopyrrol-Formoterol (BREZTRI AEROSPHERE) 160-9-4.8 MCG/ACT AERO, Inhale 2 puffs into the lungs in the morning and at bedtime., Disp: 10.7 g, Rfl: 0 .  Budeson-Glycopyrrol-Formoterol (BREZTRI AEROSPHERE) 160-9-4.8 MCG/ACT AERO, Inhale 2 puffs into the lungs in the morning and at bedtime., Disp: 10.7 g, Rfl: 6 .  busPIRone (BUSPAR) 5 MG tablet, TAKE ONE TABLET BY MOUTH TWICE A DAY AS NEEDED FOR ANXIETY, Disp: 90 tablet, Rfl: 2 .  Capsaicin 0.1 % CREA, Use gloves to apply to both feet according to package instructions for foot pain., Disp: 56.6 g, Rfl: 0 .  DULoxetine (CYMBALTA) 30 MG capsule, Take 1 capsule (30 mg total) by mouth daily., Disp: 30  capsule, Rfl: 3 .  ELIQUIS 5 MG TABS tablet, TAKE ONE TABLET BY MOUTH TWICE A DAY, Disp: 60 tablet, Rfl: 10 .  escitalopram (LEXAPRO) 20 MG tablet, TAKE 1 TABLET BY MOUTH DAILY, Disp: 30 tablet, Rfl: 5 .  furosemide (LASIX) 40 MG tablet, TAKE 1-2 TABELTS BY MOUTH TWO TIMES A DAY, Disp: 120 tablet, Rfl: 4 .  gabapentin (NEURONTIN) 100 MG capsule, TAKE 3 CAPSULES BY MOUTH AT BEDTIME, Disp: 90 capsule, Rfl: 5 .  KLOR-CON M20 20 MEQ tablet, TAKE ONE TABLET BY MOUTH TWICE A DAY, Disp: 60 tablet, Rfl: 2 .  levothyroxine (SYNTHROID) 150 MCG tablet, TAKE ONE TABLET BY MOUTH DAILY, Disp: 90 tablet, Rfl: 0 .  meloxicam (MOBIC) 7.5 MG tablet, Take 1 tablet (7.5 mg total) by mouth daily., Disp: 30 tablet, Rfl: 0 .  metFORMIN (GLUCOPHAGE) 500 MG tablet, Take 1 tablet (500 mg total) by mouth daily with breakfast., Disp: 90 tablet, Rfl: 3 .  montelukast (SINGULAIR) 10 MG tablet, Take 1 tablet (10 mg total) by mouth daily., Disp: 30 tablet, Rfl: 11 .  Multiple Vitamins-Minerals (MULTIVITAMINS THER. W/MINERALS) TABS, Take 1 tablet by mouth daily., Disp: , Rfl:  .  nortriptyline (PAMELOR) 50 MG capsule, Take 1 capsule (50 mg total) by mouth at bedtime., Disp: 90 capsule, Rfl: 1 .  predniSONE (DELTASONE) 10 MG tablet, 4 tabs for 2 days, then 3 tabs for 2 days, 2 tabs for 2 days, then 1 tab for 2 days, then stop (Patient not taking: Reported on 05/10/2020), Disp: 20 tablet, Rfl: 0 .  pregabalin (LYRICA) 75 MG  capsule, TAKE ONE CAPSULE BY MOUTH TWICE A DAY, Disp: 60 capsule, Rfl: 0 .  propranolol (INDERAL) 10 MG tablet, TAKE ONE TABLET BY MOUTH TWICE A DAY, Disp: 120 tablet, Rfl: 0 .  Spacer/Aero-Holding Chambers (AEROCHAMBER MV) inhaler, Use as instructed, Disp: 1 each, Rfl: 0 .  traMADol (ULTRAM) 50 MG tablet, TAKE 1 TABLET BY MOUTH EVERY 12 HOURS AS NEEDED FOR MODERATE PAIN, Disp: 30 tablet, Rfl: 2 .  traZODone (DESYREL) 50 MG tablet, TAKE 1/2 TO 1 TABLET BY MOUTH AT BEDTIME AS NEEDED FOR SLEEP, Disp: 30 tablet, Rfl:  2  Past Medical History: Past Medical History:  Diagnosis Date  . Allergy    States his nose runs when he is around grease.   Marland Kitchen Anxiety   . Arthritis   . Atrial fibrillation ([place])   . Cancer ([place])   . Coronary artery disease   . History of total knee replacement   . Hypertension   . Hypothyroidism   . Kidney cancer, primary, with metastasis from kidney to other site [facility])   . Kidney stone   . OSA (obstructive sleep apnea)    pt does not wear cpap at night  . Pituitary cyst ([place])   . Pneumonia    hx of  . Prostate cancer ([place])     Tobacco Use: Social History   Tobacco Use  Smoking Status Never Smoker  Smokeless Tobacco Never Used    Labs: Recent Review Flowsheet Data    Labs for ITP Cardiac and Pulmonary Rehab Latest Ref Rng & Units 02/22/2018 05/10/2018 07/14/2019 07/14/2019 07/31/2019   Cholestrol 0 - 200 mg/dL - - - - -   LDLCALC 0 - 99 mg/dL - - - - -   LDLDIRECT mg/dL - - - - -   HDL >39.00 mg/dL - - - - -   Trlycerides <150 mg/dL - - - - -   Hemoglobin A1c 4.6 - 6.5 % - 5.9 - - 6.3   PHART 7.350 - 7.450 7.441 - - 7.353 -   PCO2ART 32.0 - 48.0 mmHg 44.1 - - 56.1(H) -   HCO3 20.0 - 28.0 mmol/L 29.5(H) - 32.0(H) 31.2(H) -   TCO2 22 - 32 mmol/L 31 - 34(H) 33(H) -   ACIDBASEDEF 0.0 - 2.0 mmol/L - - - - -   O2SAT % 95.0 - 72.0 96.0 -      Capillary Blood Glucose: Lab Results  Component Value Date   GLUCAP 126 (H) 05/28/2020   GLUCAP 152 (H) 05/16/2020   GLUCAP 133 (H) 05/16/2020   GLUCAP 78 05/14/2020   GLUCAP 102 (H) 05/14/2020    POCT Glucose    Row Name 05/14/20 1454             POCT Blood Glucose   Pre-Exercise 102 mg/dL       Post-Exercise 78 mg/dL              Pulmonary Assessment Scores:  Pulmonary Assessment Scores    Row Name 05/10/20 1639         ADL UCSD   SOB Score total 107           CAT Score   CAT Score 30           mMRC Score   mMRC Score 4           UCSD: Self-administered rating of dyspnea associated with  activities of daily living (ADLs) 6-point scale (0 = "not at all" to 5 = "maximal  or unable to do because of breathlessness")  Scoring Scores range from 0 to 120.  Minimally important difference is 5 units  CAT: CAT can identify the health impairment of COPD patients and is better correlated with disease progression.  CAT has a scoring range of zero to 40. The CAT score is classified into four groups of low (less than 10), medium (10 - 20), high (21-30) and very high (31-40) based on the impact level of disease on health status. A CAT score over 10 suggests significant symptoms.  A worsening CAT score could be explained by an exacerbation, poor medication adherence, poor inhaler technique, or progression of COPD or comorbid conditions.  CAT MCID is 2 points  mMRC: mMRC (Modified Medical Research Council) Dyspnea Scale is used to assess the degree of baseline functional disability in patients of respiratory disease due to dyspnea. No minimal important difference is established. A decrease in score of 1 point or greater is considered a positive change.   Pulmonary Function Assessment:  Pulmonary Function Assessment - 05/10/20 1430      Breath   Shortness of Breath Yes;Limiting activity;Fear of Shortness of Breath           Exercise Target Goals: Exercise Program Goal: Individual exercise prescription set using results from initial 6 min walk test and THRR while considering  patient's activity barriers and safety.   Exercise Prescription Goal: Initial exercise prescription builds to 30-45 minutes a day of aerobic activity, 2-3 days per week.  Home exercise guidelines will be given to patient during program as part of exercise prescription that the participant will acknowledge.  Activity Barriers & Risk Stratification:  Activity Barriers & Cardiac Risk Stratification - 05/10/20 1429      Activity Barriers & Cardiac Risk Stratification   Activity Barriers Left Knee Replacement;Right Knee  Replacement;Shortness of Breath;Deconditioning    Cardiac Risk Stratification High           6 Minute Walk:  6 Minute Walk    Row Name 05/10/20 1602         6 Minute Walk   Phase Initial     Distance 400 feet     Walk Time 6 minutes     # of Rest Breaks 1  Took a seated break from minute 2 until minute 4, due to SOB     MPH 0.76     METS 0.14     RPE 15     Perceived Dyspnea  3     VO2 Peak 0.48     Symptoms Yes (comment)     Comments Severe shortness of breath caused him to take a seated break that lasted for 2 minutes     Resting HR 77 bpm     Resting BP 112/70     Resting Oxygen Saturation  97 %     Exercise Oxygen Saturation  during 6 min walk 90 %     Max Ex. HR 102 bpm     Max Ex. BP 146/74     2 Minute Post BP 136/72           Interval HR   1 Minute HR 93     2 Minute HR 102     3 Minute HR 91     4 Minute HR 87     5 Minute HR 93     6 Minute HR 90     2 Minute Post HR 82     Interval Heart Rate?  Yes           Interval Oxygen   Interval Oxygen? Yes     Baseline Oxygen Saturation % 97 %     1 Minute Oxygen Saturation % 95 %     1 Minute Liters of Oxygen 4 L     2 Minute Oxygen Saturation % 90 %     2 Minute Liters of Oxygen 4 L     3 Minute Oxygen Saturation % 91 %     3 Minute Liters of Oxygen 4 L     4 Minute Oxygen Saturation % 96 %     4 Minute Liters of Oxygen 4 L     5 Minute Oxygen Saturation % 95 %     5 Minute Liters of Oxygen 4 L     6 Minute Oxygen Saturation % 92 %     6 Minute Liters of Oxygen 4 L     2 Minute Post Oxygen Saturation % 98 %     2 Minute Post Liters of Oxygen 4 L            Oxygen Initial Assessment:  Oxygen Initial Assessment - 05/10/20 1600      Home Oxygen   Home Oxygen Device Home Concentrator;Portable Concentrator    Sleep Oxygen Prescription Continuous    Liters per minute 5    Home Exercise Oxygen Prescription Pulsed    Liters per minute 5    Home Resting Oxygen Prescription None    Compliance with  Home Oxygen Use No    Comments --   Pt only uses oxygen when he feels he needs it     Initial 6 min Walk   Oxygen Used Continuous    Liters per minute 4      Program Oxygen Prescription   Program Oxygen Prescription Continuous    Liters per minute 4      Intervention   Short Term Goals To learn and exhibit compliance with exercise, home and travel O2 prescription;To learn and understand importance of monitoring SPO2 with pulse oximeter and demonstrate accurate use of the pulse oximeter.;To learn and understand importance of maintaining oxygen saturations>88%;To learn and demonstrate proper pursed lip breathing techniques or other breathing techniques.;To learn and demonstrate proper use of respiratory medications    Long  Term Goals Exhibits compliance with exercise, home and travel O2 prescription;Verbalizes importance of monitoring SPO2 with pulse oximeter and return demonstration;Maintenance of O2 saturations>88%;Exhibits proper breathing techniques, such as pursed lip breathing or other method taught during program session;Compliance with respiratory medication;Demonstrates proper use of MDI's           Oxygen Re-Evaluation:  Oxygen Re-Evaluation    Row Name 05/16/20 0729 06/11/20 0822 07/09/20 0759         Program Oxygen Prescription   Program Oxygen Prescription Continuous Continuous Continuous     Liters per minute '4 4 4           ' Home Oxygen   Home Oxygen Device Home Concentrator;Portable Concentrator Home Concentrator;Portable Concentrator Home Concentrator;Portable Concentrator     Sleep Oxygen Prescription Continuous Continuous Continuous     Liters per minute '5 5 5     ' Home Exercise Oxygen Prescription Pulsed Pulsed Pulsed     Liters per minute '5 5 4     ' Home Resting Oxygen Prescription None Pulsed Pulsed     Liters per minute -- 4 4     Compliance with Home Oxygen Use No No No  Goals/Expected Outcomes   Short Term Goals To learn and exhibit compliance  with exercise, home and travel O2 prescription;To learn and understand importance of monitoring SPO2 with pulse oximeter and demonstrate accurate use of the pulse oximeter.;To learn and understand importance of maintaining oxygen saturations>88%;To learn and demonstrate proper pursed lip breathing techniques or other breathing techniques.;To learn and demonstrate proper use of respiratory medications To learn and exhibit compliance with exercise, home and travel O2 prescription;To learn and understand importance of monitoring SPO2 with pulse oximeter and demonstrate accurate use of the pulse oximeter.;To learn and understand importance of maintaining oxygen saturations>88%;To learn and demonstrate proper pursed lip breathing techniques or other breathing techniques.;To learn and demonstrate proper use of respiratory medications To learn and exhibit compliance with exercise, home and travel O2 prescription;To learn and understand importance of monitoring SPO2 with pulse oximeter and demonstrate accurate use of the pulse oximeter.;To learn and understand importance of maintaining oxygen saturations>88%;To learn and demonstrate proper pursed lip breathing techniques or other breathing techniques.;To learn and demonstrate proper use of respiratory medications     Long  Term Goals Exhibits compliance with exercise, home and travel O2 prescription;Verbalizes importance of monitoring SPO2 with pulse oximeter and return demonstration;Maintenance of O2 saturations>88%;Exhibits proper breathing techniques, such as pursed lip breathing or other method taught during program session;Compliance with respiratory medication;Demonstrates proper use of MDI's Exhibits compliance with exercise, home and travel O2 prescription;Verbalizes importance of monitoring SPO2 with pulse oximeter and return demonstration;Maintenance of O2 saturations>88%;Exhibits proper breathing techniques, such as pursed lip breathing or other method  taught during program session;Compliance with respiratory medication;Demonstrates proper use of MDI's Exhibits compliance with exercise, home and travel O2 prescription;Verbalizes importance of monitoring SPO2 with pulse oximeter and return demonstration;Maintenance of O2 saturations>88%;Exhibits proper breathing techniques, such as pursed lip breathing or other method taught during program session;Compliance with respiratory medication;Demonstrates proper use of MDI's     Comments -- Pt is not compliant with oxygen. He states he only uses it when he feels like he needs it. Pt is not compliant with oxygen. He states he only uses it when he feels like he needs it.     Goals/Expected Outcomes compliance and understanding of oxygen saturation and pursed lip breathing Compliance and understanding of oxygen saturation and pursed lip breathing. Compliance wtih oxygen prescription. Compliance and understanding of oxygen saturation and pursed lip breathing. Compliance wtih oxygen prescription.            Oxygen Discharge (Final Oxygen Re-Evaluation):  Oxygen Re-Evaluation - 07/09/20 0759      Program Oxygen Prescription   Program Oxygen Prescription Continuous    Liters per minute 4      Home Oxygen   Home Oxygen Device Home Concentrator;Portable Concentrator    Sleep Oxygen Prescription Continuous    Liters per minute 5    Home Exercise Oxygen Prescription Pulsed    Liters per minute 4    Home Resting Oxygen Prescription Pulsed    Liters per minute 4    Compliance with Home Oxygen Use No      Goals/Expected Outcomes   Short Term Goals To learn and exhibit compliance with exercise, home and travel O2 prescription;To learn and understand importance of monitoring SPO2 with pulse oximeter and demonstrate accurate use of the pulse oximeter.;To learn and understand importance of maintaining oxygen saturations>88%;To learn and demonstrate proper pursed lip breathing techniques or other breathing  techniques.;To learn and demonstrate proper use of respiratory medications    Long  Term Goals  Exhibits compliance with exercise, home and travel O2 prescription;Verbalizes importance of monitoring SPO2 with pulse oximeter and return demonstration;Maintenance of O2 saturations>88%;Exhibits proper breathing techniques, such as pursed lip breathing or other method taught during program session;Compliance with respiratory medication;Demonstrates proper use of MDI's    Comments Pt is not compliant with oxygen. He states he only uses it when he feels like he needs it.    Goals/Expected Outcomes Compliance and understanding of oxygen saturation and pursed lip breathing. Compliance wtih oxygen prescription.           Initial Exercise Prescription:  Initial Exercise Prescription - 05/10/20 1600      Date of Initial Exercise RX and Referring Provider   Date 05/10/20    Referring Provider Dr. Elsworth Soho    Expected Discharge Date 07/11/20      Oxygen   Oxygen Continuous    Liters 4      NuStep   Level 1    Minutes 30      Prescription Details   Frequency (times per week) 2    Duration Progress to 30 minutes of continuous aerobic without signs/symptoms of physical distress      Intensity   THRR 40-80% of Max Heartrate 57-114    Ratings of Perceived Exertion 11-13    Perceived Dyspnea 0-4      Progression   Progression Continue to progress workloads to maintain intensity without signs/symptoms of physical distress.      Resistance Training   Training Prescription Yes    Weight Blue bands    Reps 10-15           Perform Capillary Blood Glucose checks as needed.  Exercise Prescription Changes:  Exercise Prescription Changes    Row Name 05/14/20 1400 05/28/20 1500 05/30/20 1446 06/25/20 1500       Response to Exercise   Blood Pressure (Admit) 110/70 136/72 110/70 132/66    Blood Pressure (Exercise) 100/60 132/76 -- --    Blood Pressure (Exit) 114/60 132/68 108/68 114/70    Heart  Rate (Admit) 82 bpm 86 bpm 77 bpm 92 bpm    Heart Rate (Exercise) 96 bpm 99 bpm 89 bpm 100 bpm    Heart Rate (Exit) 88 bpm 94 bpm 84 bpm 100 bpm    Oxygen Saturation (Admit) 97 % 97 % 99 % 93 %    Oxygen Saturation (Exercise) 94 % 94 % 96 % 97 %    Oxygen Saturation (Exit) 97 % 97 % 98 % 98 %    Rating of Perceived Exertion (Exercise) '12 13 13 13    ' Perceived Dyspnea (Exercise) '2 2 2 1    ' Duration Continue with 30 min of aerobic exercise without signs/symptoms of physical distress. Continue with 30 min of aerobic exercise without signs/symptoms of physical distress. Continue with 30 min of aerobic exercise without signs/symptoms of physical distress. Progress to 30 minutes of  aerobic without signs/symptoms of physical distress    Intensity Other (comment)  40-80% HR max THRR unchanged THRR unchanged THRR unchanged         Progression   Progression Continue to progress workloads to maintain intensity without signs/symptoms of physical distress. Continue to progress workloads to maintain intensity without signs/symptoms of physical distress. Continue to progress workloads to maintain intensity without signs/symptoms of physical distress. Continue to progress workloads to maintain intensity without signs/symptoms of physical distress.         Resistance Training   Training Prescription Yes Yes Yes Yes  Weight Blue bands Blue bands Blue bands blue bands    Reps 10-15 10-15 10-15 10-15    Time 10 Minutes 10 Minutes 10 Minutes 10 Minutes         Oxygen   Oxygen Continuous Continuous Continuous Continuous    Liters '4 4 4 4         ' NuStep   Level '1 2 2 2    ' SPM -- 80 80 80    Minutes '30 30 30 30    ' METs 1.8 1.7 1.8 1.8           Exercise Comments:  Exercise Comments    Row Name 05/14/20 1454           Exercise Comments Pt completed first day of exercise and tolerated well with no complaints. He is pre-diabetic and on metformin, his pre exercise glucose was 102 and post glucose  was 78. Will continue to monitor.              Exercise Goals and Review:  Exercise Goals    Row Name 05/10/20 1601             Exercise Goals   Increase Physical Activity Yes       Intervention Provide advice, education, support and counseling about physical activity/exercise needs.;Develop an individualized exercise prescription for aerobic and resistive training based on initial evaluation findings, risk stratification, comorbidities and participant's personal goals.       Expected Outcomes Short Term: Attend rehab on a regular basis to increase amount of physical activity.;Long Term: Add in home exercise to make exercise part of routine and to increase amount of physical activity.;Long Term: Exercising regularly at least 3-5 days a week.       Increase Strength and Stamina Yes       Intervention Provide advice, education, support and counseling about physical activity/exercise needs.;Develop an individualized exercise prescription for aerobic and resistive training based on initial evaluation findings, risk stratification, comorbidities and participant's personal goals.       Expected Outcomes Short Term: Increase workloads from initial exercise prescription for resistance, speed, and METs.;Short Term: Perform resistance training exercises routinely during rehab and add in resistance training at home;Long Term: Improve cardiorespiratory fitness, muscular endurance and strength as measured by increased METs and functional capacity (6MWT)       Able to understand and use rate of perceived exertion (RPE) scale Yes       Intervention Provide education and explanation on how to use RPE scale       Expected Outcomes Short Term: Able to use RPE daily in rehab to express subjective intensity level;Long Term:  Able to use RPE to guide intensity level when exercising independently       Able to understand and use Dyspnea scale Yes       Intervention Provide education and explanation on how to use  Dyspnea scale       Expected Outcomes Short Term: Able to use Dyspnea scale daily in rehab to express subjective sense of shortness of breath during exertion;Long Term: Able to use Dyspnea scale to guide intensity level when exercising independently       Knowledge and understanding of Target Heart Rate Range (THRR) Yes       Intervention Provide education and explanation of THRR including how the numbers were predicted and where they are located for reference       Expected Outcomes Short Term: Able to state/look up THRR;Long Term: Able to  use THRR to govern intensity when exercising independently;Short Term: Able to use daily as guideline for intensity in rehab       Understanding of Exercise Prescription Yes       Intervention Provide education, explanation, and written materials on patient's individual exercise prescription       Expected Outcomes Short Term: Able to explain program exercise prescription;Long Term: Able to explain home exercise prescription to exercise independently              Exercise Goals Re-Evaluation :  Exercise Goals Re-Evaluation    Row Name 05/16/20 0727 06/11/20 0820 07/09/20 0759         Exercise Goal Re-Evaluation   Exercise Goals Review Increase Physical Activity;Increase Strength and Stamina;Able to understand and use rate of perceived exertion (RPE) scale;Able to understand and use Dyspnea scale;Knowledge and understanding of Target Heart Rate Range (THRR);Understanding of Exercise Prescription Increase Physical Activity;Increase Strength and Stamina;Able to understand and use rate of perceived exertion (RPE) scale;Able to understand and use Dyspnea scale;Knowledge and understanding of Target Heart Rate Range (THRR);Understanding of Exercise Prescription Increase Physical Activity;Increase Strength and Stamina;Able to understand and use rate of perceived exertion (RPE) scale;Able to understand and use Dyspnea scale;Knowledge and understanding of Target Heart  Rate Range (THRR);Understanding of Exercise Prescription     Comments Pt has completed one exercise session and tolerated well so far with no complaints or concerns. He is exercising at 1.8 METS on the Nustep. Will continue to monitor and progress as he is able. Pt has completed 4 exercise sessions. He has made progression with workload increases so far but not METS. He is dependent on staff helping him with resistance band exercises and helping him get on and off of exercise equipment. Goal is for pt to be more independent with exercise. He is exercising at 1.8 METS on the Nustep. Will continue to monitor and progress as he is able. Pt has only completed 5 exercise sessions and has not been to rehab in 3 weeks. He is set to graduate on 07/11/20.     Expected Outcomes Through exercise at rehab and at home, the patient will decrease shortness of breath with daily activities and feel confident in carrying out an exercise regime at home. Through exercise at rehab and at home, the patient will decrease shortness of breath with daily activities and feel confident in carrying out an exercise regime at home. Through exercise at rehab and at home, the patient will decrease shortness of breath with daily activities and feel confident in carrying out an exercise regime at home.            Discharge Exercise Prescription (Final Exercise Prescription Changes):  Exercise Prescription Changes - 06/25/20 1500      Response to Exercise   Blood Pressure (Admit) 132/66    Blood Pressure (Exit) 114/70    Heart Rate (Admit) 92 bpm    Heart Rate (Exercise) 100 bpm    Heart Rate (Exit) 100 bpm    Oxygen Saturation (Admit) 93 %    Oxygen Saturation (Exercise) 97 %    Oxygen Saturation (Exit) 98 %    Rating of Perceived Exertion (Exercise) 13    Perceived Dyspnea (Exercise) 1    Duration Progress to 30 minutes of  aerobic without signs/symptoms of physical distress    Intensity THRR unchanged      Progression    Progression Continue to progress workloads to maintain intensity without signs/symptoms of physical distress.  Resistance Training   Training Prescription Yes    Weight blue bands    Reps 10-15    Time 10 Minutes      Oxygen   Oxygen Continuous    Liters 4      NuStep   Level 2    SPM 80    Minutes 30    METs 1.8           Nutrition:  Target Goals: Understanding of nutrition guidelines, daily intake of sodium <1519m, cholesterol <2067m calories 30% from fat and 7% or less from saturated fats, daily to have 5 or more servings of fruits and vegetables.  Biometrics:  Pre Biometrics - 05/10/20 1602      Pre Biometrics   Grip Strength 25 kg            Nutrition Therapy Plan and Nutrition Goals:   Nutrition Assessments:  MEDIFICTS Score Key:  ?70 Need to make dietary changes   40-70 Heart Healthy Diet  ? 40 Therapeutic Level Cholesterol Diet   Picture Your Plate Scores:  <4<62nhealthy dietary pattern with much room for improvement.  41-50 Dietary pattern unlikely to meet recommendations for good health and room for improvement.  51-60 More healthful dietary pattern, with some room for improvement.   >60 Healthy dietary pattern, although there may be some specific behaviors that could be improved.    Nutrition Goals Re-Evaluation:  Nutrition Goals Re-Evaluation    Row Name 05/16/20 0831             Goals   Current Weight 281 lb 12 oz (127.8 kg)              Nutrition Goals Discharge (Final Nutrition Goals Re-Evaluation):  Nutrition Goals Re-Evaluation - 05/16/20 0831      Goals   Current Weight 281 lb 12 oz (127.8 kg)           Psychosocial: Target Goals: Acknowledge presence or absence of significant depression and/or stress, maximize coping skills, provide positive support system. Participant is able to verbalize types and ability to use techniques and skills needed for reducing stress and depression.  Initial Review &  Psychosocial Screening:  Initial Psych Review & Screening - 05/10/20 1431      Initial Review   Current issues with None Identified      Family Dynamics   Good Support System? Yes   Wife and son live with them. They are both supportive     Barriers   Psychosocial barriers to participate in program The patient should benefit from training in stress management and relaxation.      Screening Interventions   Interventions Encouraged to exercise           Quality of Life Scores:  Scores of 19 and below usually indicate a poorer quality of life in these areas.  A difference of  2-3 points is a clinically meaningful difference.  A difference of 2-3 points in the total score of the Quality of Life Index has been associated with significant improvement in overall quality of life, self-image, physical symptoms, and general health in studies assessing change in quality of life.  PHQ-9: Recent Review Flowsheet Data    [facility] 05/10/2020 12/13/2019 07/31/2019 06/21/2019 05/05/2019   Decreased Interest 2 0 0 0 3   Down, Depressed, Hopeless 0 0 0 0 0   PHQ - 2 Score 2 0 0 0 3   Altered sleeping 3 0 0 0 3  Tired, decreased energy 3 0 0 1 3   Change in appetite 0 0 0 0 3   Feeling bad or failure about yourself  0 0 0 0 0   Trouble concentrating 0 0 0 0 1   Moving slowly or fidgety/restless 0 0 0 0 0   Suicidal thoughts 0 0 0 0 -   PHQ-9 Score 8 0 0 1 13   Difficult doing work/chores Very difficult Not difficult at all Not difficult at all Not difficult at all -     Interpretation of Total Score  Total Score Depression Severity:  1-4 = Minimal depression, 5-9 = Mild depression, 10-14 = Moderate depression, 15-19 = Moderately severe depression, 20-27 = Severe depression   Psychosocial Evaluation and Intervention:   Psychosocial Re-Evaluation:  Psychosocial Re-Evaluation    Row Name 05/20/20 1329 06/06/20 1105 07/08/20 1340         Psychosocial Re-Evaluation   Current  issues with None Identified Current Stress Concerns Current Stress Concerns     Comments No psychosocial concerns identified at this time. Has had some issues with feeling anxious due to dealing with chronic illness. Has been absent the last 3 weeks, unable to evaluate at this time.     Expected Outcomes For [name] to continue to be free of psychsocial concerns while participating in pulmonary rehab. Teach patient how to deal with stress in positive ways. Graduates 07/11/2020 officially.     Interventions -- Stress management education;Encouraged to attend Pulmonary Rehabilitation for the exercise Encouraged to attend Pulmonary Rehabilitation for the exercise;Relaxation education;Stress management education     Continue Psychosocial Services  No Follow up required Follow up required by staff No Follow up required           Initial Review   Source of Stress Concerns -- Chronic Illness Chronic Illness;Unable to participate in former interests or hobbies;Unable to perform yard/household activities            Psychosocial Discharge (Final Psychosocial Re-Evaluation):  Psychosocial Re-Evaluation - 07/08/20 1340      Psychosocial Re-Evaluation   Current issues with Current Stress Concerns    Comments Has been absent the last 3 weeks, unable to evaluate at this time.    Expected Outcomes Graduates 07/11/2020 officially.    Interventions Encouraged to attend Pulmonary Rehabilitation for the exercise;Relaxation education;Stress management education    Continue Psychosocial Services  No Follow up required      Initial Review   Source of Stress Concerns Chronic Illness;Unable to participate in former interests or hobbies;Unable to perform yard/household activities           Education: Education Goals: Education classes will be provided on a weekly basis, covering required topics. Participant will state understanding/return demonstration of topics presented.  Learning Barriers/Preferences:   Learning Barriers/Preferences - 05/10/20 1433      Learning Barriers/Preferences   Learning Barriers None    Learning Preferences Verbal Instruction           Education Topics: Risk Factor Reduction:  -Group instruction that is supported by a PowerPoint presentation. Instructor discusses the definition of a risk factor, different risk factors for pulmonary disease, and how the heart and lungs work together.     Nutrition for Pulmonary Patient:  -Group instruction provided by PowerPoint slides, verbal discussion, and written materials to support subject matter. The instructor gives an explanation and review of healthy diet recommendations, which includes a discussion on weight management, recommendations for fruit and vegetable consumption, as well  as protein, fluid, caffeine, fiber, sodium, sugar, and alcohol. Tips for eating when patients are short of breath are discussed.   Pursed Lip Breathing:  -Group instruction that is supported by demonstration and informational handouts. Instructor discusses the benefits of pursed lip and diaphragmatic breathing and detailed demonstration on how to preform both.   Flowsheet Row PULMONARY REHAB OTHER RESPIRATORY from 12/19/2019 in [place]  Date 12/19/19  Educator Handout      Oxygen Safety:  -Group instruction provided by PowerPoint, verbal discussion, and written material to support subject matter. There is an overview of "What is Oxygen" and "Why do we need it".  Instructor also reviews how to create a safe environment for oxygen use, the importance of using oxygen as prescribed, and the risks of noncompliance. There is a brief discussion on traveling with oxygen and resources the patient may utilize.   Oxygen Equipment:  -Group instruction provided by [facility] Staff utilizing handouts, written materials, and equipment demonstrations.   Signs and Symptoms:  -Group instruction provided by written  material and verbal discussion to support subject matter. Warning signs and symptoms of infection, stroke, and heart attack are reviewed and when to call the physician/911 reinforced. Tips for preventing the spread of infection discussed.   Advanced Directives:  -Group instruction provided by verbal instruction and written material to support subject matter. Instructor reviews Advanced Directive laws and proper instruction for filling out document.   Pulmonary Video:  -Group video education that reviews the importance of medication and oxygen compliance, exercise, good nutrition, pulmonary hygiene, and pursed lip and diaphragmatic breathing for the pulmonary patient.   Exercise for the Pulmonary Patient:  -Group instruction that is supported by a PowerPoint presentation. Instructor discusses benefits of exercise, core components of exercise, frequency, duration, and intensity of an exercise routine, importance of utilizing pulse oximetry during exercise, safety while exercising, and options of places to exercise outside of rehab.     Pulmonary Medications:  -Verbally interactive group education provided by instructor with focus on inhaled medications and proper administration.   Anatomy and Physiology of the Respiratory System and Intimacy:  -Group instruction provided by PowerPoint, verbal discussion, and written material to support subject matter. Instructor reviews respiratory cycle and anatomical components of the respiratory system and their functions. Instructor also reviews differences in obstructive and restrictive respiratory diseases with examples of each. Intimacy, Sex, and Sexuality differences are reviewed with a discussion on how relationships can change when diagnosed with pulmonary disease. Common sexual concerns are reviewed.   MD DAY -A group question and answer session with a medical doctor that allows participants to ask questions that relate to their pulmonary disease  state.   OTHER EDUCATION -Group or individual verbal, written, or video instructions that support the educational goals of the pulmonary rehab program. [place] from 05/30/2020 in [place]  Date 05/30/20  Educator Handout  [[facility] [place]  Instruction Review Code 1- Verbalizes Understanding      Holiday Eating Survival Tips:  -Group instruction provided by PowerPoint slides, verbal discussion, and written materials to support subject matter. The instructor gives patients tips, tricks, and techniques to help them not only survive but enjoy the holidays despite the onslaught of food that accompanies the holidays.   Knowledge Questionnaire Score:  Knowledge Questionnaire Score - 05/10/20 1641      Knowledge Questionnaire Score   Pre Score 17/18  Core Components/Risk Factors/Patient Goals at Admission:  Personal Goals and Risk Factors at Admission - 05/10/20 1435      Core Components/Risk Factors/Patient Goals on Admission    Weight Management Obesity;Yes    Improve shortness of breath with ADL's Yes    Intervention Provide education, individualized exercise plan and daily activity instruction to help decrease symptoms of SOB with activities of daily living.    Expected Outcomes Short Term: Improve cardiorespiratory fitness to achieve a reduction of symptoms when performing ADLs;Long Term: Be able to perform more ADLs without symptoms or delay the onset of symptoms           Core Components/Risk Factors/Patient Goals Review:   Goals and Risk Factor Review    Row Name 05/20/20 1330 06/06/20 1111 07/08/20 1341         Core Components/Risk Factors/Patient Goals Review   Personal Goals Review Improve shortness of breath with ADL's;Develop more efficient breathing techniques such as purse lipped breathing and diaphragmatic breathing and practicing self-pacing with activity.;Increase knowledge of  respiratory medications and ability to use respiratory devices properly. Increase knowledge of respiratory medications and ability to use respiratory devices properly.;Develop more efficient breathing techniques such as purse lipped breathing and diaphragmatic breathing and practicing self-pacing with activity.;Improve shortness of breath with ADL's Develop more efficient breathing techniques such as purse lipped breathing and diaphragmatic breathing and practicing self-pacing with activity.;Increase knowledge of respiratory medications and ability to use respiratory devices properly.;Improve shortness of breath with ADL's     Review [name] is very deconditioned, he just started the program and has attended 2 exercise sessions, too early to see progression toward goals. 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  1.8 met level on nustep.  Not sure if he is progressing since starting the program 3 weeks ago. Poor attendance, has attended 5 exercise sessions in 9 weeks, very sick and deconditioned, no progress made .     Expected Outcomes To see [name] progress in the next full 30 days in his strength, stamina, and shortness of breath. For [name] to make slow progress in strength and endurance. Graduates 07/11/2020            Core Components/Risk Factors/Patient Goals at Discharge (Final Review):   Goals and Risk Factor Review - 07/08/20 1341      Core Components/Risk Factors/Patient Goals Review   Personal Goals Review Develop more efficient breathing techniques such as purse lipped breathing and diaphragmatic breathing and practicing self-pacing with activity.;Increase knowledge of respiratory medications and ability to use respiratory devices properly.;Improve shortness of breath with ADL's    Review Poor attendance, has attended 5 exercise sessions in 9 weeks, very sick and deconditioned, no progress made .    Expected Outcomes Graduates 07/11/2020           ITP Comments:   Comments: ITP  REVIEW Pt is making expected progress toward pulmonary rehab goals after completing 5 sessions. Recommend continued exercise, life style modification, education, and utilization of breathing techniques to increase stamina and strength and decrease shortness of breath with exertion.

## 2020-07-11 ENCOUNTER — Encounter (HOSPITAL_COMMUNITY): Payer: Medicare Other

## 2020-07-29 ENCOUNTER — Other Ambulatory Visit: Payer: Self-pay | Admitting: Podiatry

## 2020-07-29 ENCOUNTER — Other Ambulatory Visit: Payer: Self-pay | Admitting: Family Medicine

## 2020-07-30 NOTE — Addendum Note (Signed)
Encounter addended by: Lance Morin, RN on: 07/30/2020 3:56 PM  Actions taken: Clinical Note Signed, Episode resolved

## 2020-07-30 NOTE — Progress Notes (Signed)
Discharge Progress Report  Patient Details  Name: Wayne Booth MRN: [id number] Date of Birth: 25-Sep-1940 Referring Provider:   [facility] Walk Test from 05/10/2020 in [place]  Referring Provider Dr. Elsworth Soho       Number of Visits: 5  Reason for Discharge:  Early Exit:  Lack of attendance due to end stage interstitial lung disease and poor tolerance to exercise.  Smoking History:  Social History   Tobacco Use  Smoking Status Never Smoker  Smokeless Tobacco Never Used    Diagnosis:  ILD (interstitial lung disease) ([place])  ADL UCSD:  Pulmonary Assessment Scores    Row Name 05/10/20 1639         ADL UCSD   SOB Score total 107           CAT Score   CAT Score 30           mMRC Score   mMRC Score 4            Initial Exercise Prescription:  Initial Exercise Prescription - 05/10/20 1600      Date of Initial Exercise RX and Referring Provider   Date 05/10/20    Referring Provider Dr. Elsworth Soho    Expected Discharge Date 07/11/20      Oxygen   Oxygen Continuous    Liters 4      NuStep   Level 1    Minutes 30      Prescription Details   Frequency (times per week) 2    Duration Progress to 30 minutes of continuous aerobic without signs/symptoms of physical distress      Intensity   THRR 40-80% of Max Heartrate 57-114    Ratings of Perceived Exertion 11-13    Perceived Dyspnea 0-4      Progression   Progression Continue to progress workloads to maintain intensity without signs/symptoms of physical distress.      Resistance Training   Training Prescription Yes    Weight Blue bands    Reps 10-15           Discharge Exercise Prescription (Final Exercise Prescription Changes):  Exercise Prescription Changes - 06/25/20 1500      Response to Exercise   Blood Pressure (Admit) 132/66    Blood Pressure (Exit) 114/70    Heart Rate (Admit) 92 bpm    Heart Rate (Exercise) 100 bpm    Heart Rate (Exit)  100 bpm    Oxygen Saturation (Admit) 93 %    Oxygen Saturation (Exercise) 97 %    Oxygen Saturation (Exit) 98 %    Rating of Perceived Exertion (Exercise) 13    Perceived Dyspnea (Exercise) 1    Duration Progress to 30 minutes of  aerobic without signs/symptoms of physical distress    Intensity THRR unchanged      Progression   Progression Continue to progress workloads to maintain intensity without signs/symptoms of physical distress.      Resistance Training   Training Prescription Yes    Weight blue bands    Reps 10-15    Time 10 Minutes      Oxygen   Oxygen Continuous    Liters 4      NuStep   Level 2    SPM 80    Minutes 30    METs 1.8           Functional Capacity:  6 Minute Walk    Row Name 05/10/20 [phone]  6 Minute Walk   Phase Initial     Distance 400 feet     Walk Time 6 minutes     # of Rest Breaks 1  Took a seated break from minute 2 until minute 4, due to SOB     MPH 0.76     METS 0.14     RPE 15     Perceived Dyspnea  3     VO2 Peak 0.48     Symptoms Yes (comment)     Comments Severe shortness of breath caused him to take a seated break that lasted for 2 minutes     Resting HR 77 bpm     Resting BP 112/70     Resting Oxygen Saturation  97 %     Exercise Oxygen Saturation  during 6 min walk 90 %     Max Ex. HR 102 bpm     Max Ex. BP 146/74     2 Minute Post BP 136/72           Interval HR   1 Minute HR 93     2 Minute HR 102     3 Minute HR 91     4 Minute HR 87     5 Minute HR 93     6 Minute HR 90     2 Minute Post HR 82     Interval Heart Rate? Yes           Interval Oxygen   Interval Oxygen? Yes     Baseline Oxygen Saturation % 97 %     1 Minute Oxygen Saturation % 95 %     1 Minute Liters of Oxygen 4 L     2 Minute Oxygen Saturation % 90 %     2 Minute Liters of Oxygen 4 L     3 Minute Oxygen Saturation % 91 %     3 Minute Liters of Oxygen 4 L     4 Minute Oxygen Saturation % 96 %     4 Minute Liters of Oxygen 4 L      5 Minute Oxygen Saturation % 95 %     5 Minute Liters of Oxygen 4 L     6 Minute Oxygen Saturation % 92 %     6 Minute Liters of Oxygen 4 L     2 Minute Post Oxygen Saturation % 98 %     2 Minute Post Liters of Oxygen 4 L            Psychological, QOL, Others - Outcomes: PHQ 2/9: Depression screen [facility] 2/9 05/10/2020 12/13/2019 07/31/2019 06/21/2019 05/05/2019  Decreased Interest 2 0 0 0 3  Down, Depressed, Hopeless 0 0 0 0 0  PHQ - 2 Score 2 0 0 0 3  Altered sleeping 3 0 0 0 3  Tired, decreased energy 3 0 0 1 3  Change in appetite 0 0 0 0 3  Feeling bad or failure about yourself  0 0 0 0 0  Trouble concentrating 0 0 0 0 1  Moving slowly or fidgety/restless 0 0 0 0 0  Suicidal thoughts 0 0 0 0 -  PHQ-9 Score 8 0 0 1 13  Difficult doing work/chores Very difficult Not difficult at all Not difficult at all Not difficult at all -  Some recent data might be hidden    Quality of Life:   Personal Goals: Goals established at orientation with interventions provided  to work toward goal.  Personal Goals and Risk Factors at Admission - 05/10/20 1435      Core Components/Risk Factors/Patient Goals on Admission    Weight Management Obesity;Yes    Improve shortness of breath with ADL's Yes    Intervention Provide education, individualized exercise plan and daily activity instruction to help decrease symptoms of SOB with activities of daily living.    Expected Outcomes Short Term: Improve cardiorespiratory fitness to achieve a reduction of symptoms when performing ADLs;Long Term: Be able to perform more ADLs without symptoms or delay the onset of symptoms            Personal Goals Discharge:  Goals and Risk Factor Review    Row Name 05/20/20 1330 06/06/20 1111 07/08/20 1341         Core Components/Risk Factors/Patient Goals Review   Personal Goals Review Improve shortness of breath with ADL's;Develop more efficient breathing techniques such as purse lipped breathing and  diaphragmatic breathing and practicing self-pacing with activity.;Increase knowledge of respiratory medications and ability to use respiratory devices properly. Increase knowledge of respiratory medications and ability to use respiratory devices properly.;Develop more efficient breathing techniques such as purse lipped breathing and diaphragmatic breathing and practicing self-pacing with activity.;Improve shortness of breath with ADL's Develop more efficient breathing techniques such as purse lipped breathing and diaphragmatic breathing and practicing self-pacing with activity.;Increase knowledge of respiratory medications and ability to use respiratory devices properly.;Improve shortness of breath with ADL's     Review Wayne Booth is very deconditioned, he just started the program and has attended 2 exercise sessions, too early to see progression toward goals. 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  1.8 met level on nustep.  Not sure if he is progressing since starting the program 3 weeks ago. Poor attendance, has attended 5 exercise sessions in 9 weeks, very sick and deconditioned, no progress made .     Expected Outcomes To see Wayne Booth progress in the next full 30 days in his strength, stamina, and shortness of breath. For Wayne Booth to make slow progress in strength and endurance. Graduates 07/11/2020            Exercise Goals and Review:  Exercise Goals    Row Name 05/10/20 1601             Exercise Goals   Increase Physical Activity Yes       Intervention Provide advice, education, support and counseling about physical activity/exercise needs.;Develop an individualized exercise prescription for aerobic and resistive training based on initial evaluation findings, risk stratification, comorbidities and participant's personal goals.       Expected Outcomes Short Term: Attend rehab on a regular basis to increase amount of physical activity.;Long Term: Add in home exercise to make exercise part of  routine and to increase amount of physical activity.;Long Term: Exercising regularly at least 3-5 days a week.       Increase Strength and Stamina Yes       Intervention Provide advice, education, support and counseling about physical activity/exercise needs.;Develop an individualized exercise prescription for aerobic and resistive training based on initial evaluation findings, risk stratification, comorbidities and participant's personal goals.       Expected Outcomes Short Term: Increase workloads from initial exercise prescription for resistance, speed, and METs.;Short Term: Perform resistance training exercises routinely during rehab and add in resistance training at home;Long Term: Improve cardiorespiratory fitness, muscular endurance and strength as measured by increased METs and functional capacity (6MWT)  Able to understand and use rate of perceived exertion (RPE) scale Yes       Intervention Provide education and explanation on how to use RPE scale       Expected Outcomes Short Term: Able to use RPE daily in rehab to express subjective intensity level;Long Term:  Able to use RPE to guide intensity level when exercising independently       Able to understand and use Dyspnea scale Yes       Intervention Provide education and explanation on how to use Dyspnea scale       Expected Outcomes Short Term: Able to use Dyspnea scale daily in rehab to express subjective sense of shortness of breath during exertion;Long Term: Able to use Dyspnea scale to guide intensity level when exercising independently       Knowledge and understanding of Target Heart Rate Range (THRR) Yes       Intervention Provide education and explanation of THRR including how the numbers were predicted and where they are located for reference       Expected Outcomes Short Term: Able to state/look up THRR;Long Term: Able to use THRR to govern intensity when exercising independently;Short Term: Able to use daily as guideline for  intensity in rehab       Understanding of Exercise Prescription Yes       Intervention Provide education, explanation, and written materials on patient's individual exercise prescription       Expected Outcomes Short Term: Able to explain program exercise prescription;Long Term: Able to explain home exercise prescription to exercise independently              Exercise Goals Re-Evaluation:  Exercise Goals Re-Evaluation    Row Name 05/16/20 0727 06/11/20 0820 07/09/20 0759         Exercise Goal Re-Evaluation   Exercise Goals Review Increase Physical Activity;Increase Strength and Stamina;Able to understand and use rate of perceived exertion (RPE) scale;Able to understand and use Dyspnea scale;Knowledge and understanding of Target Heart Rate Range (THRR);Understanding of Exercise Prescription Increase Physical Activity;Increase Strength and Stamina;Able to understand and use rate of perceived exertion (RPE) scale;Able to understand and use Dyspnea scale;Knowledge and understanding of Target Heart Rate Range (THRR);Understanding of Exercise Prescription Increase Physical Activity;Increase Strength and Stamina;Able to understand and use rate of perceived exertion (RPE) scale;Able to understand and use Dyspnea scale;Knowledge and understanding of Target Heart Rate Range (THRR);Understanding of Exercise Prescription     Comments Pt has completed one exercise session and tolerated well so far with no complaints or concerns. He is exercising at 1.8 METS on the Nustep. Will continue to monitor and progress as he is able. Pt has completed 4 exercise sessions. He has made progression with workload increases so far but not METS. He is dependent on staff helping him with resistance band exercises and helping him get on and off of exercise equipment. Goal is for pt to be more independent with exercise. He is exercising at 1.8 METS on the Nustep. Will continue to monitor and progress as he is able. Pt has only  completed 5 exercise sessions and has not been to rehab in 3 weeks. He is set to graduate on 07/11/20.     Expected Outcomes Through exercise at rehab and at home, the patient will decrease shortness of breath with daily activities and feel confident in carrying out an exercise regime at home. Through exercise at rehab and at home, the patient will decrease shortness of breath with daily activities  and feel confident in carrying out an exercise regime at home. Through exercise at rehab and at home, the patient will decrease shortness of breath with daily activities and feel confident in carrying out an exercise regime at home.            Nutrition & Weight - Outcomes:  Pre Biometrics - 05/10/20 1602      Pre Biometrics   Grip Strength 25 kg            Nutrition:   Nutrition Discharge:  Nutrition Assessments - 07/30/20 1137      Rate Your Plate Scores   Pre Score 43           Education Questionnaire Score:  Knowledge Questionnaire Score - 05/10/20 1641      Knowledge Questionnaire Score   Pre Score 17/18           Goals reviewed with patient; copy given to patient.

## 2020-07-30 NOTE — Telephone Encounter (Signed)
Please advise 

## 2020-07-30 NOTE — Addendum Note (Signed)
Encounter addended by: George Ina, RD on: 07/30/2020 11:39 AM  Actions taken: Flowsheet accepted

## 2020-07-31 ENCOUNTER — Telehealth: Payer: Self-pay | Admitting: *Deleted

## 2020-07-31 NOTE — Telephone Encounter (Signed)
Patient is requesting a refill of the (Duloxetine 30mg ). Please send to pharmacy on file.

## 2020-08-02 ENCOUNTER — Telehealth: Payer: Self-pay | Admitting: Podiatry

## 2020-08-02 NOTE — Telephone Encounter (Signed)
Waiting for a call back from pt for an update on the current medication he's taking.

## 2020-08-02 NOTE — Telephone Encounter (Signed)
[place] or [place]- I had put this patient on Cymbalta awhile back. I see that his PCP put him on gabapentin in December. I am hesitant to go ahead and refill the Cymbalta while he is on all the other medication. I think he has been off of Cymbalta for a few months based on the timeframe from the last time I ordered it. Can one of you please call him to clarify what medication he is actually taking.

## 2020-08-04 DIAGNOSIS — G4733 Obstructive sleep apnea (adult) (pediatric): Secondary | ICD-10-CM | POA: Diagnosis not present

## 2020-08-05 ENCOUNTER — Telehealth: Payer: Self-pay | Admitting: *Deleted

## 2020-08-05 NOTE — Telephone Encounter (Signed)
Ok thanks [place] for getting in contact with him.

## 2020-08-05 NOTE — Telephone Encounter (Signed)
Called and spoke with the patient and stated that Dr Jacqualyn Posey was not going to fill the cymbalta due to patient was taken the gabapentin and the patient stated that he was not taken the gabapentin any longer and I stated that I would let Dr Jacqualyn Posey know. Lattie Haw

## 2020-08-05 NOTE — Telephone Encounter (Signed)
Error. Gearldine Looney 

## 2020-08-05 NOTE — Telephone Encounter (Signed)
Dr. Elisha Ponder- This is the patient I was talking about. I didn't feel comfortable refilling Cymbalta as it looks like his PCP put him on gabapentin in December. Can you see

## 2020-08-06 ENCOUNTER — Ambulatory Visit: Payer: Medicare Other | Admitting: Podiatry

## 2020-08-08 ENCOUNTER — Telehealth: Payer: Self-pay | Admitting: Podiatry

## 2020-08-08 ENCOUNTER — Other Ambulatory Visit: Payer: Self-pay | Admitting: Podiatry

## 2020-08-08 NOTE — Telephone Encounter (Signed)
I was told he is not on Lyrica anymore. Nira Conn, can you please call him. Also, it looks like his PCP put him on gabapentin in December

## 2020-08-08 NOTE — Telephone Encounter (Signed)
Patient has requested refill on Lyrica, Please Advise

## 2020-08-08 NOTE — Telephone Encounter (Signed)
Left a detailed message to call back to clarify if he is taking gabapentin or lyrica.

## 2020-08-08 NOTE — Telephone Encounter (Signed)
Please advise 

## 2020-08-09 NOTE — Telephone Encounter (Signed)
I called the pharmacy and he has picked up Lyrica the start of October for a 30 day supply but he has picked up gabapentin in Jan 2022.

## 2020-08-13 ENCOUNTER — Encounter: Payer: Self-pay | Admitting: Internal Medicine

## 2020-08-15 ENCOUNTER — Other Ambulatory Visit: Payer: Self-pay | Admitting: Podiatry

## 2020-08-15 NOTE — Telephone Encounter (Signed)
Please advise 

## 2020-09-01 DIAGNOSIS — G4733 Obstructive sleep apnea (adult) (pediatric): Secondary | ICD-10-CM | POA: Diagnosis not present

## 2020-10-02 DIAGNOSIS — G4733 Obstructive sleep apnea (adult) (pediatric): Secondary | ICD-10-CM | POA: Diagnosis not present

## 2020-10-03 ENCOUNTER — Ambulatory Visit: Payer: Medicare Other | Admitting: Adult Health

## 2020-10-08 ENCOUNTER — Telehealth: Payer: Self-pay | Admitting: Podiatry

## 2020-10-08 ENCOUNTER — Telehealth: Payer: Self-pay | Admitting: *Deleted

## 2020-10-08 NOTE — Telephone Encounter (Signed)
Patient is requesting something called in for his discomfort and pain in feet.He has not been seen in our office since 11/21,needs a sooner  appointment , please schedule.

## 2020-10-08 NOTE — Telephone Encounter (Signed)
LVM for patient to return call to get scheduled sooner for bilateral foot pain with nail trim

## 2020-10-10 ENCOUNTER — Telehealth: Payer: Self-pay | Admitting: Podiatry

## 2020-10-10 NOTE — Telephone Encounter (Signed)
Called patient to schedule appointment, patient is currently feeling bilateral foot pain and discomfort, LVM for return call to get scheduled with [facility] 10/19/20

## 2020-10-16 ENCOUNTER — Other Ambulatory Visit: Payer: Self-pay

## 2020-10-16 ENCOUNTER — Encounter: Payer: Self-pay | Admitting: Podiatrist

## 2020-10-16 ENCOUNTER — Ambulatory Visit: Payer: Medicare Other | Admitting: Podiatrist

## 2020-10-16 DIAGNOSIS — M79672 Pain in left foot: Secondary | ICD-10-CM | POA: Diagnosis not present

## 2020-10-16 DIAGNOSIS — M79671 Pain in right foot: Secondary | ICD-10-CM

## 2020-10-16 DIAGNOSIS — G629 Polyneuropathy, unspecified: Secondary | ICD-10-CM | POA: Diagnosis not present

## 2020-10-16 DIAGNOSIS — M79675 Pain in left toe(s): Secondary | ICD-10-CM | POA: Diagnosis not present

## 2020-10-16 DIAGNOSIS — B351 Tinea unguium: Secondary | ICD-10-CM

## 2020-10-16 DIAGNOSIS — M79674 Pain in right toe(s): Secondary | ICD-10-CM

## 2020-10-16 MED ORDER — TRAMADOL HCL 50 MG PO TABS
50.0000 mg | ORAL_TABLET | Freq: Three times a day (TID) | ORAL | 0 refills | Status: AC | PRN
Start: 2020-10-16 — End: 2020-10-21

## 2020-10-16 NOTE — Progress Notes (Signed)
  80 y.o. male presents with today with his wife with chief concern of burning pain in feet b/l for several months and painful thick toenails that are difficult to trim. Pain interferes with ambulation. Aggravating factors include wearing enclosed shoe gear. Pain is relieved with periodic professional debridement.    He states he continues to have burning on the plantar aspect of both feet. Symptoms occur more regularly now during the day and at night. He denies any lower back problems/injuries. He has tried gabapentin and pregabalin with no relief.  He has not seen a neurologist for his neuropathy but is interested in a referral.    Constitutional Pt is a pleasant 80 y.o. Caucasian male in NAD. AAO x 3.   Vascular Capillary fill time to digits <3 seconds b/l lower extremities. Palpable pedal pulses b/l LE. Pedal hair absent. Lower extremity skin temperature gradient within normal limits. No ischemia or gangrene noted b/l lower extremities. No cyanosis or clubbing noted.  Neurologic Normal speech. Pt has subjective symptoms of neuropathy. Protective sensation decreased with 10 gram monofilament b/l. Absent vibratory sensation noted. Clonus negative b/l.  Dermatologic Pedal skin with normal turgor, texture and tone bilaterally. No open wounds bilaterally. No interdigital macerations bilaterally. Toenails 1-5 b/l elongated, discolored, dystrophic, thickened, crumbly with subungual debris and tenderness to dorsal palpation. No hyperkeratotic nor porokeratotic lesions present on today's visit.  Orthopedic: Normal muscle strength 5/5 to all lower extremity muscle groups bilaterally. Arch height increased on right greater than left foot.  First mpj arthritis present left foot with joint limitation noted with ROM.    Radiographs: None Assessment:    1. Pain due to onychomycosis of toenails of both feet   2. Foot pain, bilateral   3. Neuropathic pain     Plan:  Patient was evaluated and treated and all  questions answered.   Onychomycosis with pain -Debridement of toenails was recommended.  Onychoreduction of symptomatic toenails was performed via nail nipper and power burr without iatrogenic incident.  Patient was instructed on signs and symptoms of infection and was told to call immediately should any of these arise.    -Educated on self-care   -Discussed neuropathic pain with patient and his wife. Patient has tried gabapentin and pregabalin with no improvement in symptoms of burning plantar aspect of both feet.  - will refer to Neurology for bilateral neuropathic pain in feet described  -Patient to continue soft, supportive shoe gear daily. -Patient/POA to call should there be question/concern in the interim. - 5 day rx for ultram written for night time pain.     Return in about 3 months

## 2020-10-16 NOTE — Patient Instructions (Signed)
I will make your neurology appointment today-  Please call our office if you have not heard anything on Friday.

## 2020-11-01 ENCOUNTER — Ambulatory Visit: Payer: Medicare Other | Admitting: Podiatry

## 2020-11-01 DIAGNOSIS — G4733 Obstructive sleep apnea (adult) (pediatric): Secondary | ICD-10-CM | POA: Diagnosis not present

## 2020-11-04 ENCOUNTER — Other Ambulatory Visit: Payer: Self-pay | Admitting: Podiatrist

## 2020-11-05 ENCOUNTER — Encounter: Payer: Self-pay | Admitting: Family Medicine

## 2020-11-05 ENCOUNTER — Ambulatory Visit (INDEPENDENT_AMBULATORY_CARE_PROVIDER_SITE_OTHER): Payer: Medicare Other | Admitting: Family Medicine

## 2020-11-05 ENCOUNTER — Other Ambulatory Visit: Payer: Self-pay

## 2020-11-05 VITALS — BP 118/72 | HR 71 | Temp 98.4°F | Ht 71.0 in | Wt 256.6 lb

## 2020-11-05 DIAGNOSIS — R739 Hyperglycemia, unspecified: Secondary | ICD-10-CM | POA: Diagnosis not present

## 2020-11-05 DIAGNOSIS — E890 Postprocedural hypothyroidism: Secondary | ICD-10-CM

## 2020-11-05 DIAGNOSIS — G629 Polyneuropathy, unspecified: Secondary | ICD-10-CM | POA: Diagnosis not present

## 2020-11-05 DIAGNOSIS — I10 Essential (primary) hypertension: Secondary | ICD-10-CM

## 2020-11-05 DIAGNOSIS — Z79899 Other long term (current) drug therapy: Secondary | ICD-10-CM | POA: Diagnosis not present

## 2020-11-05 DIAGNOSIS — R0602 Shortness of breath: Secondary | ICD-10-CM

## 2020-11-05 LAB — CBC WITH DIFFERENTIAL/PLATELET
Basophils Absolute: 0 10*3/uL (ref 0.0–0.1)
Basophils Relative: 0.4 % (ref 0.0–3.0)
Eosinophils Absolute: 0.3 10*3/uL (ref 0.0–0.7)
Eosinophils Relative: 3.9 % (ref 0.0–5.0)
HCT: 49.5 % (ref 39.0–52.0)
Hemoglobin: 16.6 g/dL (ref 13.0–17.0)
Lymphocytes Relative: 15.8 % (ref 12.0–46.0)
Lymphs Abs: 1.1 10*3/uL (ref 0.7–4.0)
MCHC: 33.6 g/dL (ref 30.0–36.0)
MCV: 91.2 fl (ref 78.0–100.0)
Monocytes Absolute: 0.7 10*3/uL (ref 0.1–1.0)
Monocytes Relative: 9.2 % (ref 3.0–12.0)
Neutro Abs: 5.1 10*3/uL (ref 1.4–7.7)
Neutrophils Relative %: 70.7 % (ref 43.0–77.0)
Platelets: 322 10*3/uL (ref 150.0–400.0)
RBC: 5.43 Mil/uL (ref 4.22–5.81)
RDW: 15.4 % (ref 11.5–15.5)
WBC: 7.2 10*3/uL (ref 4.0–10.5)

## 2020-11-05 LAB — COMPREHENSIVE METABOLIC PANEL
ALT: 95 U/L — ABNORMAL HIGH (ref 0–53)
AST: 46 U/L — ABNORMAL HIGH (ref 0–37)
Albumin: 3.7 g/dL (ref 3.5–5.2)
Alkaline Phosphatase: 80 U/L (ref 39–117)
BUN: 9 mg/dL (ref 6–23)
CO2: 31 mEq/L (ref 19–32)
Calcium: 9.2 mg/dL (ref 8.4–10.5)
Chloride: 100 mEq/L (ref 96–112)
Creatinine, Ser: 0.95 mg/dL (ref 0.40–1.50)
GFR: 76.74 mL/min (ref >60.00)
Glucose, Bld: 87 mg/dL (ref 70–99)
Potassium: 4.6 mEq/L (ref 3.5–5.1)
Sodium: 137 mEq/L (ref 135–145)
Total Bilirubin: 1.1 mg/dL (ref 0.2–1.2)
Total Protein: 7.3 g/dL (ref 6.0–8.3)

## 2020-11-05 LAB — TSH: TSH: 4.91 u[IU]/mL — ABNORMAL HIGH (ref 0.35–4.50)

## 2020-11-05 LAB — T3, FREE: T3, Free: 2.6 pg/mL (ref 2.3–4.2)

## 2020-11-05 LAB — HEMOGLOBIN A1C: Hgb A1c MFr Bld: 5.6 % (ref 4.6–6.5)

## 2020-11-05 LAB — T4, FREE: Free T4: 0.63 ng/dL (ref 0.60–1.60)

## 2020-11-05 LAB — VITAMIN B12: Vitamin B-12: 369 pg/mL (ref 211–911)

## 2020-11-05 MED ORDER — TRAMADOL HCL 50 MG PO TABS: ORAL_TABLET | ORAL | 2 refills | Status: DC

## 2020-11-05 NOTE — Progress Notes (Signed)
Phone [phone] In person visit   Subjective:   Wayne Booth is a 80 y.o. year old very pleasant male patient who presents for/with See problem oriented charting Chief Complaint  Patient presents with  . Fatigue    Patient is being treated for COPD, Having difficulty breathing .    This visit occurred during the SARS-CoV-2 public health emergency.  Safety protocols were in place, including screening questions prior to the visit, additional usage of staff PPE, and extensive cleaning of exam room while observing appropriate contact time as indicated for disinfecting solutions.   Past Medical History-  Patient Active Problem List   Diagnosis Date Noted  . Pneumonia due to COVID-19 virus 04/20/2019    Priority: High  . Memory loss 06/10/2018    Priority: High  . Chronic respiratory failure with hypoxia (HCC) 04/26/2018    Priority: High  . CAD (coronary artery disease) 04/09/2018    Priority: High  . CKD (chronic kidney disease) stage 3, GFR 30-59 ml/min (HCC)     Priority: High  . Chest pain     Priority: High  . Atrial flutter ([place]) 11/01/2017    Priority: High  . Chronic pulmonary embolism ([place]) 10/23/2016    Priority: High  . Asthmatic bronchitis 06/09/2011    Priority: High  . History of prostate cancer-seed implantation 2010.  Dr. Alinda Money 10/02/2008    Priority: High  . History of renal cell carcinoma 12/26/2007    Priority: High  . PTSD (post-traumatic stress disorder) 07/27/2018    Priority: Medium  . HCAP (healthcare-associated pneumonia) 02/03/2018    Priority: Medium  . History of adenomatous polyp of colon 04/12/2017    Priority: Medium  . Cervical disc disease 11/13/2015    Priority: Medium  . Essential tremor 08/14/2015    Priority: Medium  . Gout 08/14/2015    Priority: Medium  . Hypothyroidism 07/12/2014    Priority: Medium  . Hyperglycemia 07/12/2014    Priority: Medium  . GAD (generalized anxiety disorder) 11/07/2013    Priority: Medium   . OSA (obstructive sleep apnea) 07/07/2011    Priority: Medium  . Morbid obesity ([place]) 10/02/2008    Priority: Medium  . Insomnia 12/26/2007    Priority: Medium  . Essential hypertension 12/28/2006    Priority: Medium  . Aortic atherosclerosis ([place]) 05/10/2018    Priority: Low  . First degree AV block 04/15/2018    Priority: Low  . Constipation 01/21/2018    Priority: Low  . Anticoagulated     Priority: Low  . Pain in left knee 07/15/2017    Priority: Low  . Bilateral foot pain 07/13/2017    Priority: Low  . Community acquired pneumonia 06/18/2017    Priority: Low  . Status post nephrectomy 06/09/2017    Priority: Low  . VTE (venous thromboembolism) 06/09/2017    Priority: Low  . Actinic keratosis 11/27/2009    Priority: Low  . Osteoarthritis 11/13/2008    Priority: Low  . TESTOSTERONE DEFICIENCY 12/28/2006    Priority: Low  . 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 Pulmonary nodule 10/13/2019  . Hyponatremia 04/15/2018  . Physical deconditioning 04/15/2018  . Pleural effusion 04/14/2018  . Chronic pain 04/09/2018  . Dyspnea     Medications- reviewed and updated Current Outpatient Medications  Medication Sig Dispense Refill  . albuterol (VENTOLIN HFA) 108 (90 Base) MCG/ACT inhaler Inhale 2 puffs into the lungs every 6 (six) hours as needed for wheezing or shortness of breath. [place]  g 2  . Budeson-Glycopyrrol-Formoterol (BREZTRI AEROSPHERE) 160-9-4.8 MCG/ACT AERO Inhale 2 puffs into the lungs in the morning and at bedtime. 10.7 g 6  . busPIRone (BUSPAR) 5 MG tablet TAKE ONE TABLET BY MOUTH TWICE A DAY AS NEEDED FOR ANXIETY 90 tablet 2  . ELIQUIS 5 MG TABS tablet TAKE ONE TABLET BY MOUTH TWICE A DAY 60 tablet 10  . escitalopram (LEXAPRO) 20 MG tablet TAKE 1 TABLET BY MOUTH DAILY 30 tablet 5  . furosemide (LASIX) 40 MG tablet TAKE 1-2 TABLETS BY MOUTH  TWO TIMES A DAY 120 tablet 4  . gabapentin (NEURONTIN) 100 MG capsule TAKE 3 CAPSULES BY MOUTH AT  BEDTIME 90 capsule 5  . KLOR-CON M20 20 MEQ tablet TAKE ONE TABLET BY MOUTH TWICE A DAY 60 tablet 2  . levothyroxine (SYNTHROID) 150 MCG tablet TAKE ONE TABLET BY MOUTH DAILY 90 tablet 0  . meloxicam (MOBIC) 7.5 MG tablet Take 1 tablet (7.5 mg total) by mouth daily. 30 tablet 0  . metFORMIN (GLUCOPHAGE) 500 MG tablet Take 1 tablet (500 mg total) by mouth daily with breakfast. 90 tablet 3  . montelukast (SINGULAIR) 10 MG tablet Take 1 tablet (10 mg total) by mouth daily. 30 tablet 11  . nortriptyline (PAMELOR) 50 MG capsule Take 1 capsule (50 mg total) by mouth at bedtime. 90 capsule 1  . propranolol (INDERAL) 10 MG tablet TAKE ONE TABLET BY MOUTH TWICE A DAY 120 tablet 0  . Spacer/Aero-Holding Chambers (AEROCHAMBER MV) inhaler Use as instructed 1 each 0  . traZODone (DESYREL) 50 MG tablet TAKE 1/2 TO 1 TABLET BY MOUTH AT BEDTIME AS NEEDED FOR SLEEP 30 tablet 2  . albuterol (PROVENTIL) (2.5 MG/3ML) 0.083% nebulizer solution Take 3 mLs (2.5 mg total) by nebulization every 6 (six) hours as needed for wheezing or shortness of breath. (Patient not taking: Reported on 11/05/2020) 75 mL 3  . Multiple Vitamins-Minerals (MULTIVITAMINS THER. W/MINERALS) TABS Take 1 tablet by mouth daily. (Patient not taking: Reported on 11/05/2020)    . traMADol (ULTRAM) 50 MG tablet TAKE 1 TABLET BY MOUTH EVERY 12 HOURS AS NEEDED FOR MODERATE PAIN 30 tablet 2   No current facility-administered medications for this visit.     Objective:  BP 118/72   Pulse 71   Temp 98.4 F (36.9 C) (Temporal)   Ht 5\' 11"  (1.803 m)   Wt 256 lb 9.6 oz (116.4 kg)   SpO2 91%   BMI 35.79 kg/m  Gen: NAD, resting comfortably CV: RRR, no murmurs rubs or gallops Lungs: CTAB no crackles, wheeze, rhonchi Abdomen: soft/nontender/nondistended/normal bowel sounds. No rebound or guarding.  Ext: trace edema   EKG: First-degree AV block with rate 64, normal axis, prolonged PR but otherwise normal intervals, no hypertrophy, poor R wave  progression and low voltage-overall appears similar to EKG from 07/14/2019     Assessment and Plan   # Fatigue  S: He does not resting well at night and reports not feeling well today. Patient carries diagnosis of asthmatic bronchitis with allergic component, recurrent pneumonia, OSA declined CPAP, postinflammatory fibrosis, chronic respiratory failure on oxygen with activity at bedtime.  Prior severe COVID-19 infection October 2020.  Patient also has chronic pulmonary embolism on long-term Eliquis (remains on this also for a fib)   Last seen by pulmonology September 2021-treated with course of prednisone at that time.  Advised to continue Breztri 2 puffs twice daily, Singulair, Zyrtec with albuterol as needed, continue oxygen as needed with plan for 4 to  6-week follow-up.  Unfortunately patient has not been consistent with Breztri.   Patient last saw cardiology January 2021 Dr. Efraim Kaufmann cathat that time with nonobstructive disease.  A. Fib. No exertional chest pain, tightness, or pressure. He uses his oxygen with activity and at night. Oxygen typically High 80s and low 90s. A/P: Fatigue could be related to poorly controlled pulmonary disease-encouraged regular use of Breztri and follow up with pulm. Reassuring catheterization last year - with long term need for lasix at night dose may have CHF.  We will evaluate fatigue with updated blood work as well.  # shortness of breath/asthmatic bronchitis/postinflammatory fibrosis S:When he walks he has some shortness of breath, he has to use his at home oxygen. His O2 STAT higher 80s and lower 90s. He has been taking his inhaler  A/P: Restart Breztri twice daily on a daily basis and schedule a follow up in the next two weeks. If not improving consider CXR at follow up    # Pre-Diabetes S: Medication: 500 mg Metformin PO daily  Exercise and diet- he is sedentary, and does not participate in formal exercise Lab Results  Component Value Date    HGBA1C 6.3 07/31/2019   HGBA1C 5.9 05/10/2018   HGBA1C 6.3 12/16/2017  A/P: Hopefully stable and has not progressed to diabetes-update A1c with labs today.  If blood sugars are high could contribute to fatigue  #hypothyroidism S: compliant On thyroid medication-150 mcg levothyroxine  Lab Results  Component Value Date   TSH 0.97 07/31/2019   A/P: Hopefully well-controlled-update TSH, T3, T4 since this is postoperative hypothyroidism T3 and T4 are most important  # Neuropathy S:Gabapentin 900 mg at bed. , Nortriptyline 50 mg. pregablin 75 mg on list but not recently taking- removed from list. cymbalta on list but not taking. Marland Kitchen His wife reports that he his been in a lot of pain.  He is requesting a refill on the Tramadol. He had a podiatry appointment on 10/16/20 but did not attend due recommended from clinic.  A/P: Referring to pain management . Refill tramadol. Discussed serotonin syndrome risk with trazodone, tramadol, lexapro, buspirone- but he has done well to this point- would not have him on cymbalta as well as rxed by podiatry. Would prefer to wean these- we did try to space tramadol out fair amount   Recommended follow [id number] in about 1 month (around 12/06/2020) for follow up- or sooner if needed. Future Appointments  Date Time Provider [place]  01/03/2021  4:00 PM Marin Olp, MD [facility]  01/21/2021  1:45 PM Marzetta Board, DPM [facility]    Lab/Order associations:   ICD-10-CM   1. SOB (shortness of breath)  R06.02 EKG 12-Lead  2. Hyperglycemia  R73.9 Hemoglobin A1c  3. High risk medication use  Z79.899 Vitamin B12  4. 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 Essential hypertension  I10 CBC with Differential/Platelet    Comprehensive metabolic panel  6. Neuropathy  G62.9 Ambulatory referral to Pain Clinic    Meds ordered this encounter  Medications  . traMADol (ULTRAM) 50 MG tablet    Sig: TAKE 1 TABLET BY  MOUTH EVERY 12 HOURS AS NEEDED FOR MODERATE PAIN    Dispense:  30 tablet    Refill:  2    Return precautions advised.    I,Alexis Bryant,acting as a scribe for [name], MD.,have documented all relevant documentation on the behalf of [name], MD,as directed  by  [name], MD while in the presence of [name], MD.   I, [name], MD, have reviewed all documentation for this visit. The documentation on 11/05/20 for the exam, diagnosis, procedures, and orders are all accurate and complete.  [name], MD

## 2020-11-05 NOTE — Patient Instructions (Addendum)
Health Maintenance Due  Topic Date Due  . COVID-19 Vaccine (3 - Pfizer risk 4-dose series) Will call with dates.  10/09/2019  . COLONOSCOPY (Pts 45-32yrs Insurance coverage will need to be confirmed) Discuss. Patient states he thinks he's too old for one.  05/27/2020   We will call you within two weeks about your referral to pain management (may take months to get in). If you do not hear within 2 weeks, give [place] a call.   Does not appear you have recently picked up lyrica from pharmacy which could contribute to pain worsening  Restart bretrizi inhaler twice daily on regular basis  You are overdue for follow up with lung doctor- I want you to restart this inhaler and schedule a follow up in next few weeks. Ok to see me a few weeks after that visit  Please stop by lab before you go If you have mychart- we will send your results within 3 business days of [place] receiving them.  If you do not have mychart- we will call you about results within 5 business days of [place] receiving them.  *please also note that you will see labs on mychart as soon as they post. I will later go in and write notes on them- will say "notes from Dr. Yong Channel"   Recommended follow up: Return in about 1 month (around 12/06/2020) for follow up- or sooner if needed.

## 2020-11-06 ENCOUNTER — Telehealth: Payer: Self-pay

## 2020-11-06 NOTE — Telephone Encounter (Signed)
LM on pt vm tcb  

## 2020-11-06 NOTE — Telephone Encounter (Signed)
Patient calling back about lab results would like to be called back at [phone].

## 2020-11-07 DIAGNOSIS — G629 Polyneuropathy, unspecified: Secondary | ICD-10-CM | POA: Diagnosis not present

## 2020-11-07 DIAGNOSIS — G894 Chronic pain syndrome: Secondary | ICD-10-CM | POA: Diagnosis not present

## 2020-11-07 DIAGNOSIS — Z79891 Long term (current) use of opiate analgesic: Secondary | ICD-10-CM | POA: Diagnosis not present

## 2020-11-07 DIAGNOSIS — Z79899 Other long term (current) drug therapy: Secondary | ICD-10-CM | POA: Diagnosis not present

## 2020-12-02 DIAGNOSIS — G4733 Obstructive sleep apnea (adult) (pediatric): Secondary | ICD-10-CM | POA: Diagnosis not present

## 2020-12-09 ENCOUNTER — Ambulatory Visit (INDEPENDENT_AMBULATORY_CARE_PROVIDER_SITE_OTHER): Payer: Medicare Other

## 2020-12-09 ENCOUNTER — Encounter: Payer: Self-pay | Admitting: Podiatry

## 2020-12-09 ENCOUNTER — Other Ambulatory Visit: Payer: Self-pay

## 2020-12-09 ENCOUNTER — Telehealth: Payer: Self-pay | Admitting: *Deleted

## 2020-12-09 ENCOUNTER — Ambulatory Visit: Payer: Medicare Other | Admitting: Podiatry

## 2020-12-09 DIAGNOSIS — M722 Plantar fascial fibromatosis: Secondary | ICD-10-CM

## 2020-12-09 DIAGNOSIS — G629 Polyneuropathy, unspecified: Secondary | ICD-10-CM

## 2020-12-09 MED ORDER — TRIAMCINOLONE ACETONIDE 10 MG/ML IJ SUSP
10.0000 mg | Freq: Once | INTRAMUSCULAR | Status: AC
  Administered 2020-12-09: 10 mg

## 2020-12-09 MED ORDER — GABAPENTIN 400 MG PO CAPS: 400.0000 mg | ORAL_CAPSULE | Freq: Three times a day (TID) | ORAL | 4 refills | Status: DC

## 2020-12-09 NOTE — Telephone Encounter (Signed)
[name] is calling to request sleeping pills,patient is in so much pain from his Neuropathy. Please advise.

## 2020-12-10 NOTE — Progress Notes (Signed)
Subjective:   Patient ID: Wayne Booth, male   DOB: 80 y.o.   MRN: [id number]   HPI Patient presents stating both his feet bother him tremendously and he has tried different medicines to try to help with that he has trouble sleeping and the pain is intensifying.  Also complains of his right heel being very bad at and presents with son today   ROS      Objective:  Physical Exam  Neurovascular status intact with patient found to have inflammation pain of the right heel and generalized neuropathic pain bilateral with discomfort that has been gradually intensifying over the last few years     Assessment:  Acute [place] fasciitis right along with neuropathic symptomatology chronic in nature bilateral     Plan:  H&P went ahead and for the acute inflammation I did do sterile prep and injected the heel 3 mg Kenalog 5 mg Xylocaine and for the neuropathic I recommended increasing gabapentin and discussed other treatment to try to reduce his symptoms and we will consider this and I want to try at least gabapentin for several weeks and also injection.  Reappoint to recheck and the consideration again will be there for more advanced type treatments to help him  X-rays indicate spur no indication stress fracture or other pathology

## 2020-12-10 NOTE — Telephone Encounter (Signed)
Will have to get from primary care doc

## 2020-12-11 ENCOUNTER — Telehealth: Payer: Self-pay

## 2020-12-11 NOTE — Telephone Encounter (Signed)
[facility] states that the patient has declined the offer for an appointment and was instructed to contact our office concerning the referral that was placed. The wife stated that they are not traveling to [place] for an appointment.

## 2020-12-11 NOTE — Telephone Encounter (Signed)
Returned call to patient, no answer, left vmessage per Dr Mellody Drown note.

## 2020-12-12 ENCOUNTER — Ambulatory Visit: Payer: Medicare Other | Admitting: Adult Health

## 2020-12-12 NOTE — Telephone Encounter (Signed)
Pain management is very hard to get into- I would like to have their advice on his pain- I would encourage him to take appointment and if he refuses- not much we can do here

## 2020-12-26 ENCOUNTER — Other Ambulatory Visit: Payer: Self-pay

## 2020-12-26 ENCOUNTER — Encounter: Payer: Self-pay | Admitting: Family Medicine

## 2020-12-26 ENCOUNTER — Ambulatory Visit (INDEPENDENT_AMBULATORY_CARE_PROVIDER_SITE_OTHER): Payer: Medicare Other | Admitting: Family Medicine

## 2020-12-26 VITALS — BP 133/83 | HR 87 | Temp 99.2°F | Ht 71.0 in | Wt 256.2 lb

## 2020-12-26 DIAGNOSIS — Z0000 Encounter for general adult medical examination without abnormal findings: Secondary | ICD-10-CM | POA: Diagnosis not present

## 2020-12-26 DIAGNOSIS — E039 Hypothyroidism, unspecified: Secondary | ICD-10-CM | POA: Diagnosis not present

## 2020-12-26 DIAGNOSIS — Z13220 Encounter for screening for lipoid disorders: Secondary | ICD-10-CM | POA: Diagnosis not present

## 2020-12-26 DIAGNOSIS — I10 Essential (primary) hypertension: Secondary | ICD-10-CM

## 2020-12-26 DIAGNOSIS — Z8546 Personal history of malignant neoplasm of prostate: Secondary | ICD-10-CM | POA: Diagnosis not present

## 2020-12-26 DIAGNOSIS — J9611 Chronic respiratory failure with hypoxia: Secondary | ICD-10-CM

## 2020-12-26 DIAGNOSIS — R531 Weakness: Secondary | ICD-10-CM | POA: Diagnosis not present

## 2020-12-26 DIAGNOSIS — I2782 Chronic pulmonary embolism: Secondary | ICD-10-CM | POA: Diagnosis not present

## 2020-12-26 DIAGNOSIS — I4892 Unspecified atrial flutter: Secondary | ICD-10-CM | POA: Diagnosis not present

## 2020-12-26 MED ORDER — NORTRIPTYLINE HCL 25 MG PO CAPS: 25.0000 mg | ORAL_CAPSULE | Freq: Every day | ORAL | 3 refills | Status: DC

## 2020-12-26 MED ORDER — NORTRIPTYLINE HCL 50 MG PO CAPS: 50.0000 mg | ORAL_CAPSULE | Freq: Every day | ORAL | 3 refills | Status: DC

## 2020-12-26 MED ORDER — TRAZODONE HCL 50 MG PO TABS: ORAL_TABLET | ORAL | 2 refills | Status: DC

## 2020-12-26 NOTE — Patient Instructions (Addendum)
Health Maintenance Due  Topic Date Due   COVID-19 Vaccine (3 - Pfizer risk series) [name] will call us with the dates.  10/09/2019   COLONOSCOPY (Pts 45-39yrs Insurance coverage will need to be confirmed) Discuss.  He declines today due to not wanting to come off of Eliquis to avoid stroke risk. 05/27/2020   In regards to therapy, please call [phone] to schedule a visit with [facility].  I want you to stop taking singulair as it can cause issues with your mood.  I strongly advise you to visit a dentist.  Please go to the front desk to schedule a visit with Physical Therapy before you leave today.sorry front desk has left- please call back for this.   Prevnar 20 today.  I recommend that you get your third COVID-19 booster at your local pharmacy.  I encourage that you please restart your CPAP so your heart is not stressed.  I recommend that you come off of buspirone to reduce your serotonin syndrome syndrome risk.  I refilled trazodone for sleep.  Thank you for doing labs today If you have mychart- we will send your results within 3 business days of [place] receiving them.  If you do not have mychart- we will call you about results within 5 business days of [place] receiving them.  *please also note that you will see labs on mychart as soon as they post. I will later go in and write notes on them- will say "notes from Dr. Yong Channel"   Recommended follow up: Return in about 3 months (around 03/28/2021) for follow up- or sooner if needed.

## 2020-12-26 NOTE — Progress Notes (Signed)
Phone: [phone]   Subjective:  Patient presents today for their annual physical. Chief complaint-noted.   See problem oriented charting- ROS- full  review of systems was completed and negative  except for: runny nose, shortness of breath stable, cold intolerance, agitation, sad, troule with sleep, anxiety  The following were reviewed and entered/updated in epic: Past Medical History:  Diagnosis Date   Allergy    States his nose runs when he is around grease.    Anxiety    Arthritis    Atrial fibrillation (HCC)    Cancer ([place])    Coronary artery disease    History of total knee replacement    Hypertension    Hypothyroidism    Kidney cancer, primary, with metastasis from kidney to other site [facility])    Kidney stone    OSA (obstructive sleep apnea)    pt does not wear cpap at night   Pituitary cyst ([place])    Pneumonia    hx of   Prostate cancer [facility])    Patient Active Problem List   Diagnosis Date Noted   Pneumonia due to COVID-19 virus 04/20/2019    Priority: High   Memory loss 06/10/2018    Priority: High   Chronic respiratory failure with hypoxia ([place]) 04/26/2018    Priority: High   CAD (coronary artery disease) 04/09/2018    Priority: High   CKD (chronic kidney disease) stage 3, GFR 30-59 ml/min (HCC)     Priority: High   Chest pain     Priority: High   Atrial flutter ([place]) 11/01/2017    Priority: High   Chronic pulmonary embolism ([place]) 10/23/2016    Priority: High   Asthmatic bronchitis 06/09/2011    Priority: High   History of prostate cancer-seed implantation 2010.  Dr. Alinda Money 10/02/2008    Priority: High   History of renal cell carcinoma 12/26/2007    Priority: High   PTSD (post-traumatic stress disorder) 07/27/2018    Priority: Medium   HCAP (healthcare-associated pneumonia) 02/03/2018    Priority: Medium   History of adenomatous polyp of colon 04/12/2017    Priority: Medium   Cervical disc disease 11/13/2015    Priority: Medium   Essential  tremor 08/14/2015    Priority: Medium   Gout 08/14/2015    Priority: Medium   Hypothyroidism 07/12/2014    Priority: Medium   Hyperglycemia 07/12/2014    Priority: Medium   GAD (generalized anxiety disorder) 11/07/2013    Priority: Medium   OSA (obstructive sleep apnea) 07/07/2011    Priority: Medium   Morbid obesity ([place]) 10/02/2008    Priority: Medium   Insomnia 12/26/2007    Priority: Medium   Essential hypertension 12/28/2006    Priority: Medium   Aortic atherosclerosis ([place]) 05/10/2018    Priority: Low   First degree AV block 04/15/2018    Priority: Low   Constipation 01/21/2018    Priority: Low   Anticoagulated     Priority: Low   Pain in left knee 07/15/2017    Priority: Low   Bilateral foot pain 07/13/2017    Priority: [place] acquired pneumonia 06/18/2017    Priority: Low   Status post nephrectomy 06/09/2017    Priority: Low   VTE (venous thromboembolism) 06/09/2017    Priority: Low   Actinic keratosis 11/27/2009    Priority: Low   Osteoarthritis 11/13/2008    Priority: Low   TESTOSTERONE DEFICIENCY 12/28/2006    Priority: Low   Pulmonary fibrosis, 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[place]) 10/13/2019  Pulmonary nodule 10/13/2019   Hyponatremia 04/15/2018   Physical deconditioning 04/15/2018   Pleural effusion 04/14/2018   Chronic pain 04/09/2018   Dyspnea    Past Surgical History:  Procedure Laterality Date   ABLATION OF DYSRHYTHMIC FOCUS  01/28/2018   ANTERIOR CERVICAL DECOMP/DISCECTOMY FUSION N/A 08/29/2012   Procedure: ANTERIOR CERVICAL DECOMPRESSION/DISCECTOMY FUSION 1 LEVEL;  Surgeon: Eustace Moore, MD;  Location: [place] NEURO ORS;  Service: Neurosurgery;  Laterality: N/A;   APPENDECTOMY     ATRIAL FIBRILLATION ABLATION N/A 01/28/2018   Procedure: ATRIAL FIBRILLATION ABLATION;  Surgeon: Constance Haw, MD;  Location: [place] CV LAB;  Service: Cardiovascular;  Laterality: N/A;   CARDIAC CATHETERIZATION  2008   clean   CHOLECYSTECTOMY     COLONOSCOPY  W/ POLYPECTOMY     CRANIOTOMY N/A 11/08/2012   Procedure: CRANIOTOMY HYPOPHYSECTOMY TRANSNASAL APPROACH;  Surgeon: Faythe Ghee, MD;  Location: [place] NEURO ORS;  Service: Neurosurgery;  Laterality: N/A;  Transphenoidal Resection of Pituitary Tumor   FINGER SURGERY Left 2017   Dr Amedeo Plenty   INSERTION PROSTATE RADIATION SEED     IR THORACENTESIS ASP PLEURAL SPACE W/IMG GUIDE  04/15/2018   KIDNEY STONE SURGERY     KNEE ARTHROSCOPY  2007   left   NEPHRECTOMY Left    POSTERIOR CERVICAL LAMINECTOMY Left 04/24/2015   Procedure: Foraminotomy cervical five - cervical six cervical six - cervical seven left;  Surgeon: Eustace Moore, MD;  Location: [facility];  Service: Neurosurgery;  Laterality: Left;   REPLACEMENT TOTAL KNEE BILATERAL     RIGHT/LEFT HEART CATH AND CORONARY ANGIOGRAPHY N/A 07/14/2019   Procedure: RIGHT/LEFT HEART CATH AND CORONARY ANGIOGRAPHY;  Surgeon: Jettie Booze, MD;  Location: [place] CV LAB;  Service: Cardiovascular;  Laterality: N/A;    Family History  Problem Relation Age of Onset   Cancer Father        ?mouth, died before patient born   Cancer Mother        stomach, died when he was young   Kidney cancer Sister    Colon cancer Brother 1   Esophageal cancer Neg Hx    Rectal cancer Neg Hx    Stomach cancer Neg Hx     Medications- reviewed and updated Current Outpatient Medications  Medication Sig Dispense Refill   albuterol (VENTOLIN HFA) 108 (90 Base) MCG/ACT inhaler Inhale 2 puffs into the lungs every 6 (six) hours as needed for wheezing or shortness of breath. 18 g 2   Budeson-Glycopyrrol-Formoterol (BREZTRI AEROSPHERE) 160-9-4.8 MCG/ACT AERO Inhale 2 puffs into the lungs in the morning and at bedtime. 10.7 g 6   ELIQUIS 5 MG TABS tablet TAKE ONE TABLET BY MOUTH TWICE A DAY 60 tablet 10   escitalopram (LEXAPRO) 20 MG tablet TAKE 1 TABLET BY MOUTH DAILY 30 tablet 5   furosemide (LASIX) 40 MG tablet TAKE 1-2 TABLETS BY MOUTH  TWO TIMES A DAY 120 tablet 4    gabapentin (NEURONTIN) 100 MG capsule TAKE 3 CAPSULES BY MOUTH AT BEDTIME 90 capsule 5   gabapentin (NEURONTIN) 400 MG capsule Take 1 capsule (400 mg total) by mouth 3 (three) times daily. 90 capsule 4   KLOR-CON M20 20 MEQ tablet TAKE ONE TABLET BY MOUTH TWICE A DAY 60 tablet 2   levothyroxine (SYNTHROID) 150 MCG tablet TAKE ONE TABLET BY MOUTH DAILY 90 tablet 0   metFORMIN (GLUCOPHAGE) 500 MG tablet Take 1 tablet (500 mg total) by mouth daily with breakfast. 90 tablet 3  propranolol (INDERAL) 10 MG tablet TAKE ONE TABLET BY MOUTH TWICE A DAY 120 tablet 0   Spacer/Aero-Holding Chambers (AEROCHAMBER MV) inhaler Use as instructed 1 each 0   traMADol (ULTRAM) 50 MG tablet TAKE 1 TABLET BY MOUTH EVERY 12 HOURS AS NEEDED FOR MODERATE PAIN 30 tablet 2   albuterol (PROVENTIL) (2.5 MG/3ML) 0.083% nebulizer solution Take 3 mLs (2.5 mg total) by nebulization every 6 (six) hours as needed for wheezing or shortness of breath. (Patient not taking: No sig reported) 75 mL 3   Multiple Vitamins-Minerals (MULTIVITAMINS THER. W/MINERALS) TABS Take 1 tablet by mouth daily. (Patient not taking: No sig reported)     nortriptyline (PAMELOR) 50 MG capsule Take 1 capsule (50 mg total) by mouth at bedtime. 90 capsule 3   traZODone (DESYREL) 50 MG tablet TAKE 1/2 TO 1 TABLET BY MOUTH AT BEDTIME AS NEEDED FOR SLEEP 30 tablet 2   No current facility-administered medications for this visit.    Allergies-reviewed and updated Allergies  Allergen Reactions   Ambien [Zolpidem] Anxiety    Anxiety and agitation when tried as sleeper in hospital aug 2019    Social History   Social History Narrative   Married 1965 (wife patient of Dr. Megan Salon). 2 sons. 4 granddaughters.       Retired Chemical engineer.       Hobbies: golf, fishing, formerly hunting.    Objective  Objective:  BP 133/83   Pulse 87   Temp 99.2 F (37.3 C) (Temporal)   Ht 5\' 11"  (1.803 m)   Wt 256 lb 3.2 oz (116.2 kg)   SpO2 93% Comment:  Patient states that he is on oxygen athome but he doesn't feel like he needs any now  BMI 35.73 kg/m  Gen: NAD, resting comfortably HEENT: Mucous membranes are moist. Oropharynx normal, tympanic membrane normal No obvious carotid bruits CV: RRR no murmurs rubs or gallops Lungs: CTAB no crackles, wheeze, rhonchi Abdomen: soft/nontender/nondistended/normal bowel sounds. No rebound or guarding.  Ext: Minimal edema Skin: warm, dry Neuro: PERRLA, hard of hearing, circles back to conversations at times-some forgetfulness    Assessment and Plan  80 y.o. male presenting for annual physical.  Health Maintenance counseling: 1. Anticipatory guidance: Patient counseled regarding regular dental exams - advised q6 months- advised to see, eye exams -yearly,  avoiding smoking and second hand smoke , limiting alcohol to 2 beverages per day - extremely rare.   2. Risk factor reduction:  Advised patient of need for regular exercise and diet rich and fruits and vegetables to reduce risk of heart attack and stroke. Exercise- not exercising much- encouraged 150 minutes a week. Diet-patient previously with weight loss but has stablized= states appetite was simply lower and continues to be lower.  Morbid obesity status noted with BMI over 35 and CAD on CT scan, hypertension/arthritis issues Wt Readings from Last 3 Encounters:  12/26/20 256 lb 3.2 oz (116.2 kg)  11/05/20 256 lb 9.6 oz (116.4 kg)  06/25/20 277 lb 5.4 oz (125.8 kg)  3. Immunizations/screenings/ancillary studies- Prevnar 20 and COVID-19- 3 booster- would recommend at the pharmacy Immunization History  Administered Date(s) Administered   Fluad Quad(high Dose 65+) 03/16/2019, 05/14/2020   Influenza Split 04/01/2011, 03/29/2012   Influenza Whole 04/12/2007, 03/18/2009, 03/04/2010   Influenza, High Dose Seasonal PF 04/03/2014, 04/04/2015, 04/24/2016, 04/12/2017, 04/26/2018   Influenza,inj,Quad PF,6+ Mos 03/22/2013   Influenza-Unspecified 05/29/2017    PFIZER(Purple Top)SARS-COV-2 Vaccination 08/19/2019, 09/11/2019   Pneumococcal Conjugate-13 11/12/2014   Pneumococcal Polysaccharide-23 06/09/2011  Pneumococcal-Unspecified 06/30/2015   Td 06/29/1994   Tdap 06/09/2011   Zoster Recombinat (Shingrix) 03/16/2019, 07/19/2019   Zoster, Live 09/22/2007  4. Prostate cancer screening-history of prostate cancer with prior seed implants in 2010-PSAs have been excellent but not recently checked-we will attempt to add on to labs from today Lab Results  Component Value Date   PSA <0.015 03/25/2018   PSA <0.015 03/23/2017   PSA 0.00 (L) 11/07/2015   5. Colon cancer screening - last on 05/27/17 with a 3-year repeat planned- he is overdue- he declines as prefers to remain on eliquis and not increase stroke risk 6. Skin cancer screening- does not see dermatology. advised regular sunscreen use. Denies worrisome, changing, or new skin lesions.  7. Never smoker 8. STD screening - only active with wife   Status of chronic or acute concerns   #Decreased strength/generalized weakness- patient reports over the last few months his overall strength has diminished in the timeframe where he has felt more fatigued. He requests possible help with exercising. I think starting with PT would be a great thing for him.  -Patient did have some prior significant weight loss but reports related to decreased appetite-has been stable recently-we discussed possible unintentional weight loss work-up-he declines but agrees if worsens to reconsider  #Heart failure  # Chronic respiratory failure- continues on 02 at night after covid 19- no daytime oxgyen- also could be related to asthmatic bronchitis on betrizi instead of symbicort- followed by pulmonology Dr. Elsworth Soho. Prior pulmonary rehab. Also has OSA on cpap- has not used- encouraged to restart- declines S: Medication: furosemide 40mg  BID. Potassium twice daily  Edema: none Weight gain:no Shortness of breath: yes but  stable A/P: CHF-appears stable- continue current meds.  Does have some crackles-but appears to have previously been diagnosed with postinflammatory pulmonary fibrosis-recommended patient follow-up with pulmonology for this and asthmatic bronchitis.  Chronic respiratory failure appears stable-continue with home oxygen  # Atrial fibrillation #Chronic PE- history PE following hand surgery In past S: Rate controlled with  propranolol Anticoagulated with  eliquis 5 mg BID A/P: Atrial fibrillation is a appropriately anticoagulated and rate controlled.  Patient remains on Eliquis which reduces his risk of recurrent PE-he wants to avoid coming off of medication for colonoscopy  #Neuropathy- feet #insomnia #Cervical disc disease- working to titrate down nortriptyline due to serotonin syndrome risk. Also on gabapentin S: ongoing neuropathy issues. Nortriptyline and gabapentin help some. He has not been able to sleep  and has a lot of daytime burning recently but I do not see much space to increase meds . Prior nortriptyline we were titrating down but we have hit a barrier now.  -Prior on Lyrica through podiatry-he is off his medication now -Gabapentin also helpful for cervical disc issues -He also had tramadol available for pain which I recommended to use sparingly due to risk of serotonin syndrome with other medications. -B12 has been normal. A1c in prediabetes range. T3 and T4 normal.  We have discussed possible neurology referral or pain management-   A/P: neuropathy poorly controlled-previously referred to pain management but this is simply not possible for patient with gas prices and distance to [employer] was the only location that was able to except within reasonable timeframe.  Consider reducing nortriptyline to 25 mg but patient is not able to tolerate this at present-we will continue nortriptyline 50 mg.  Continue gabapentin as well. He plans to see is podiatrist  # Depression #  Generalized anxiety disorder  S: Medication:Lexapro 20Mg ,  nortriptyline 50mg  down from 75 mg- for cervical disc disease primarily, Buspirone 5Mg  once or twice a week. Depression screen [facility] 2/9 11/05/2020 05/10/2020 12/13/2019  Decreased Interest 0 2 0  Down, Depressed, Hopeless 0 0 0  PHQ - 2 Score 0 2 0  Altered sleeping 3 3 0  Tired, decreased energy 3 3 0  Change in appetite 0 0 0  Feeling bad or failure about yourself  0 0 0  Trouble concentrating 0 0 0  Moving slowly or fidgety/restless 0 0 0  Suicidal thoughts 0 0 0  PHQ-9 Score 6 8 0  Difficult doing work/chores Somewhat difficult Very difficult Not difficult at all  Some recent data might be hidden   A/P: since we were not able to titrate down nortriptyline anymore today-we discussed possibly coming off of buspirone which he is using very sparingly-he is in agreement to this and this was discontinued from his list.  Mild poor control- we discussed adding therapy visit as do not have much space to add other medications  # Hyperglycemia/insulin resistance/prediabetes S: Medication:  metformin 500mg  daily  Lab Results  Component Value Date   HGBA1C 5.6 11/05/2020   HGBA1C 6.3 07/31/2019   HGBA1C 5.9 05/10/2018  A/P: If numbers remain low consider stopping metformin at next visit  # postoperative Hypothyroidism- cannot use TSH S: compliant On thyroid medication-Levothyroxine 150Mg  A/P:stable on last check- continue current   #hypertension S: medication:  proranolol 10mg  BID, lasix BP Readings from Last 3 Encounters:  12/26/20 133/83  11/05/20 118/72  05/10/20 122/68  A/P: Stable. Continue current medications.   #Chronic kidney disease stage III- unilateral kidney- listed in past- most recently GFR >60  #Memory loss-intermittent issues with memory after multiple hospitalizations since 2019. MRI of the brain January 2020 largely unremarkable. B12 levels have been normal. Issues even when depression well controlled.- he  feels has stablized  #allergies- on singulair- discussed risks to mood . Last refilled 2020  #screening for HLD S: Medication:none  Lab Results  Component Value Date   CHOL 141 04/12/2017   HDL 38.20 (L) 04/12/2017   LDLCALC 69 04/12/2017   LDLDIRECT 62.0 09/07/2006   TRIG 100 02/18/2018   CHOLHDL 4 04/12/2017   A/P: no HLD with LDL under 70 in past- update with labs today   Recommended follow up: No follow-ups on file. Future Appointments  Date Time Provider [place]  01/21/2021  1:45 PM Marzetta Board, DPM [facility]  01/27/2021 11:00 AM Milinda Pointer, MD [facility] None   Lab/Order associations: NOT fasting   ICD-10-CM   1. Preventative health care  Z00.00     2. Screening for hyperlipidemia  Z13.220 Lipid panel    3. Essential hypertension  I10 Comprehensive metabolic panel    4. Generalized weakness  R53.1 Ambulatory referral to Physical Therapy    5. History of prostate cancer  Z85.46 PSA      Meds ordered this encounter  Medications   DISCONTD: nortriptyline (PAMELOR) 25 MG capsule    Sig: Take 1 capsule (25 mg total) by mouth at bedtime.    Dispense:  90 capsule    Refill:  3   nortriptyline (PAMELOR) 50 MG capsule    Sig: Take 1 capsule (50 mg total) by mouth at bedtime.    Dispense:  90 capsule    Refill:  3   traZODone (DESYREL) 50 MG tablet    Sig: TAKE 1/2 TO 1 TABLET BY MOUTH AT BEDTIME AS NEEDED FOR SLEEP  Dispense:  30 tablet    Refill:  2   I,Harris Phan,acting as a scribe for [name], MD.,have documented all relevant documentation on the behalf of [name], MD,as directed by  [name], MD while in the presence of [name], MD.  I, [name], MD, have reviewed all documentation for this visit. The documentation on 12/26/20 for the exam, diagnosis, procedures, and orders are all accurate and complete.  Return precautions advised.  [name], MD

## 2020-12-27 ENCOUNTER — Ambulatory Visit: Payer: Medicare Other | Admitting: Family Medicine

## 2020-12-27 LAB — COMPREHENSIVE METABOLIC PANEL
ALT: 20 U/L (ref 0–53)
AST: 25 U/L (ref 0–37)
Albumin: 3.5 g/dL (ref 3.5–5.2)
Alkaline Phosphatase: 69 U/L (ref 39–117)
BUN: 11 mg/dL (ref 6–23)
CO2: 25 mEq/L (ref 19–32)
Calcium: 9.1 mg/dL (ref 8.4–10.5)
Chloride: 102 mEq/L (ref 96–112)
Creatinine, Ser: 0.97 mg/dL (ref 0.40–1.50)
GFR: 74.77 mL/min (ref >60.00)
Glucose, Bld: 126 mg/dL — ABNORMAL HIGH (ref 70–99)
Potassium: 3.8 mEq/L (ref 3.5–5.1)
Sodium: 138 mEq/L (ref 135–145)
Total Bilirubin: 0.7 mg/dL (ref 0.2–1.2)
Total Protein: 7.2 g/dL (ref 6.0–8.3)

## 2020-12-27 LAB — LIPID PANEL
Cholesterol: 133 mg/dL (ref 0–200)
HDL: 33.4 mg/dL — ABNORMAL LOW (ref >39.00)
LDL Cholesterol: 66 mg/dL (ref 0–99)
NonHDL: 99.5
Total CHOL/HDL Ratio: 4
Triglycerides: 167 mg/dL — ABNORMAL HIGH (ref 0.0–149.0)
VLDL: 33.4 mg/dL (ref 0.0–40.0)

## 2020-12-27 LAB — PSA: PSA: 0 ng/mL — ABNORMAL LOW (ref 0.10–4.00)

## 2021-01-01 DIAGNOSIS — G4733 Obstructive sleep apnea (adult) (pediatric): Secondary | ICD-10-CM | POA: Diagnosis not present

## 2021-01-03 ENCOUNTER — Encounter: Payer: Medicare Other | Admitting: Family Medicine

## 2021-01-07 ENCOUNTER — Encounter: Payer: Self-pay | Admitting: Pulmonary Disease

## 2021-01-07 ENCOUNTER — Other Ambulatory Visit: Payer: Self-pay

## 2021-01-07 ENCOUNTER — Ambulatory Visit: Payer: Medicare Other | Admitting: Pulmonary Disease

## 2021-01-07 VITALS — BP 130/58 | HR 97 | Ht 71.0 in | Wt 257.8 lb

## 2021-01-07 DIAGNOSIS — J9611 Chronic respiratory failure with hypoxia: Secondary | ICD-10-CM

## 2021-01-07 DIAGNOSIS — G4733 Obstructive sleep apnea (adult) (pediatric): Secondary | ICD-10-CM

## 2021-01-07 DIAGNOSIS — J8410 Pulmonary fibrosis, unspecified: Secondary | ICD-10-CM | POA: Diagnosis not present

## 2021-01-07 DIAGNOSIS — J4550 Severe persistent asthma, uncomplicated: Secondary | ICD-10-CM

## 2021-01-07 MED ORDER — BREZTRI AEROSPHERE 160-9-4.8 MCG/ACT IN AERO: 2.0000 | INHALATION_SPRAY | Freq: Two times a day (BID) | RESPIRATORY_TRACT | 0 refills | Status: DC

## 2021-01-07 NOTE — Progress Notes (Signed)
Subjective:    Patient ID: Wayne Booth, male    DOB: [birth year], 80 y.o.   MRN: [id number]  HPI  80 yo man ,very minimal smoking history followed for asthmatic bronchitis with allergic component, recurrent pneumonia, obstructive sleep apnea(declines CPAP), postinflammatory fibrosis, chronic respiratory failure on oxygen with activity and at bedtime COVID-19 infection October 2020 required hospitalization  Previous PE in 2018 on lifelong anticoagulation  PMH -pituitary tumor removal, atrial flutter, renal cell carcinoma status post nephrectomy and diastolic heart failure  Chief Complaint  Patient presents with   Shortness of Breath    Breathing not better since last visit, exercising makes it worse.   He was last seen 02/2020 by APP, reviewed office visit He has been maintained on regimen of Breztri and uses albuterol for breakthrough.  He remains dyspneic on minimal exertion.  Oxygen saturation 92% on arrival today.  He is compliant with oxygen during sleep only uses it with unusual activity.  He is interested in a rehab program, he just completed about a year ago Accompanied by his wife who corroborates history   Significant tests/ events reviewed  12/2018 HRCT Chest -patchy parenchymal groundglass with interstitial coarsening bilaterally similar to 2019 from a multi lobar pneumonia felt to be a component of postinflammatory changes not consistent with UIP.    CT chest October 2019 small to moderate bilateral pleural effusions consolidations within the left greater than right lower lobe and lingula.  Hazy density in the left greater than right lower lobe.  CT chest June 08, 2017 showed a small left lower lobe area of consolidation.     2D echo February 04, 2018 showed EF 50 to 55%      07/2019 Eosinophils 100>700>800 IGE  367 ANA, CCP , ANCA neg . RA 16.7    NPSG 08/2019 showed moderate sleep apnea with AHI at 26/hour SPO2 low at 82%   PFT 09/2019 shows a decline in lung  function with FEV1 at 41%, ratio 88, FVC 34%, no significant bronchodilator response, DLCO 61%.  (Previous DLCO 67%, FEV1 61%-2019)  PFT 07/2017 moderate restriction, DLCO 67%   Past Medical History:  Diagnosis Date   Allergy    States his nose runs when he is around grease.    Anxiety    Arthritis    Atrial fibrillation (HCC)    Cancer ([place])    Coronary artery disease    History of total knee replacement    Hypertension    Hypothyroidism    Kidney cancer, primary, with metastasis from kidney to other site [facility])    Kidney stone    OSA (obstructive sleep apnea)    pt does not wear cpap at night   Pituitary cyst (HCC)    Pneumonia    hx of   Prostate cancer ([place])      Review of Systems neg for any significant sore throat, dysphagia, itching, sneezing, nasal congestion or excess/ purulent secretions, fever, chills, sweats, unintended wt loss, pleuritic or exertional cp, hempoptysis, orthopnea pnd or change in chronic leg swelling. Also denies presyncope, palpitations, heartburn, abdominal pain, nausea, vomiting, diarrhea or change in bowel or urinary habits, dysuria,hematuria, rash, arthralgias, visual complaints, headache, numbness weakness or ataxia.      Objective:   Physical Exam  Gen. Pleasant, obese, in no distress, normal affect ENT - no pallor,icterus, no post nasal drip, class 2-3 airway Neck: No JVD, no thyromegaly, no carotid bruits Lungs: no use of accessory muscles, no dullness to percussion, BL  dry rales up to one third Cardiovascular: Rhythm regular, heart sounds  normal, no murmurs or gallops, no peripheral edema Abdomen: soft and non-tender, no hepatosplenomegaly, BS normal. Musculoskeletal: No deformities, no cyanosis or clubbing Neuro:  alert, non focal, no tremors       Assessment & Plan:

## 2021-01-07 NOTE — Patient Instructions (Signed)
Sample & refill for [facility] Pulmonary rehab referral  HRCT chest to follow on pulmonary fibrosis

## 2021-01-08 ENCOUNTER — Encounter (HOSPITAL_COMMUNITY): Payer: Self-pay

## 2021-01-08 ENCOUNTER — Telehealth (HOSPITAL_COMMUNITY): Payer: Self-pay

## 2021-01-08 NOTE — Assessment & Plan Note (Signed)
Declines treatment and not interested in CPAP

## 2021-01-08 NOTE — Assessment & Plan Note (Signed)
Continue oxygen during activity and sleep Referral to center-based pulmonary rehab

## 2021-01-08 NOTE — Telephone Encounter (Signed)
Recv'ed PR referral for pt. Pt 3rd PR referral since 7/21, 1st referral pt stop due to medical issues, 2nd referral due to poor attendance. Pt also has referral pending for PT, he will need to complete PT before he can start PR.   Attempted to call patient in regards to Pulmonary Rehab - LM on VM   Mailed letter

## 2021-01-08 NOTE — Assessment & Plan Note (Addendum)
This was not present prior to COVID infections appears to be post-COVID inflammatory fibrosis, fairly extensive and contributing to his respiratory failure.  Doubt we will see much improvement here and doubt steroid responsive at this point -We will repeat high-resolution CT to assess for progression , if progressive may have to consider other etiologies

## 2021-01-08 NOTE — Assessment & Plan Note (Signed)
Continue Breztri for now since he reports subjective benefit

## 2021-01-10 ENCOUNTER — Telehealth (HOSPITAL_COMMUNITY): Payer: Self-pay

## 2021-01-10 NOTE — Telephone Encounter (Signed)
Pt wife called wanting to see if pt could get in the pulmonary rehab program, we advised pt and pt wife of the 3 times pt was in the program and of the backlog we have. Gave pt referral to June Leap (pulmonary rehab nurse) for review.

## 2021-01-13 NOTE — Telephone Encounter (Signed)
Per Remo Lipps pulmonary rehab RN, pt is not appropriate for pulmonary rehab at this time. I advised pt that he needs to complete PT and then will be re reviewed at a later date.

## 2021-01-16 ENCOUNTER — Ambulatory Visit (INDEPENDENT_AMBULATORY_CARE_PROVIDER_SITE_OTHER)
Admission: RE | Admit: 2021-01-16 | Discharge: 2021-01-16 | Disposition: A | Discharge status: Home / Self Care | Payer: Medicare Other | Source: Ambulatory Visit | Attending: Pulmonary Disease | Admitting: Pulmonary Disease

## 2021-01-16 ENCOUNTER — Other Ambulatory Visit: Payer: Self-pay

## 2021-01-16 DIAGNOSIS — R0600 Dyspnea, unspecified: Secondary | ICD-10-CM | POA: Diagnosis not present

## 2021-01-16 DIAGNOSIS — J90 Pleural effusion, not elsewhere classified: Secondary | ICD-10-CM | POA: Diagnosis not present

## 2021-01-16 DIAGNOSIS — J479 Bronchiectasis, uncomplicated: Secondary | ICD-10-CM | POA: Diagnosis not present

## 2021-01-16 DIAGNOSIS — I700 Atherosclerosis of aorta: Secondary | ICD-10-CM | POA: Diagnosis not present

## 2021-01-16 DIAGNOSIS — J8410 Pulmonary fibrosis, unspecified: Secondary | ICD-10-CM

## 2021-01-20 ENCOUNTER — Ambulatory Visit: Payer: Medicare Other | Admitting: Physical Therapy

## 2021-01-21 ENCOUNTER — Ambulatory Visit: Payer: Medicare Other | Admitting: Physical Therapy

## 2021-01-21 ENCOUNTER — Telehealth: Payer: Self-pay | Admitting: Pulmonary Disease

## 2021-01-21 ENCOUNTER — Telehealth (HOSPITAL_COMMUNITY): Payer: Self-pay | Admitting: Family Medicine

## 2021-01-21 ENCOUNTER — Telehealth (HOSPITAL_COMMUNITY): Payer: Self-pay | Admitting: *Deleted

## 2021-01-21 ENCOUNTER — Ambulatory Visit: Payer: Medicare Other | Admitting: Podiatry

## 2021-01-21 DIAGNOSIS — J8410 Pulmonary fibrosis, unspecified: Secondary | ICD-10-CM

## 2021-01-21 NOTE — Telephone Encounter (Signed)
Returned pt call from message left earlier.  Reminded pt that due to extremely long wait list for scheduling pulmonary rehab he will need to complete PT first before scheduling.  PT is based upon his multiple attempts to participate in pulmonary rehab which he was not able to be consistent with attendance due to limited mobility.  Felt PT was a more appropriate option for him as he was unable to do the required exercises.Cherre Huger, BSN Cardiac and Training and development officer

## 2021-01-21 NOTE — Telephone Encounter (Signed)
There is some degree of scarring in the lungs that has worsened compared to2021 CT. Would have to check for inflammatory causes -suggest blood work ANA, CCP, ACElevel to further investigate While it could have also been related to COVID infection, it seems this was present even prior to his COVID infection in October and should not be gettingworse now compared to 2021   I called and spoke with pt and he is aware of results.  He is going to be coming in the office tomorrow to get his labs done.  Will forward back to RA as an FYI about his labs.

## 2021-01-21 NOTE — Progress Notes (Signed)
Called pt and there was no answer-LMTCB °

## 2021-01-22 ENCOUNTER — Other Ambulatory Visit: Payer: Self-pay

## 2021-01-22 DIAGNOSIS — R531 Weakness: Secondary | ICD-10-CM

## 2021-01-26 DIAGNOSIS — M899 Disorder of bone, unspecified: Secondary | ICD-10-CM | POA: Insufficient documentation

## 2021-01-26 DIAGNOSIS — Z79899 Other long term (current) drug therapy: Secondary | ICD-10-CM | POA: Insufficient documentation

## 2021-01-26 DIAGNOSIS — Z789 Other specified health status: Secondary | ICD-10-CM | POA: Insufficient documentation

## 2021-01-26 NOTE — Progress Notes (Deleted)
No-show to initial evaluation on 01/27/2021.

## 2021-01-27 ENCOUNTER — Ambulatory Visit: Payer: Medicare Other | Attending: Pain Medicine | Admitting: Pain Medicine

## 2021-01-27 DIAGNOSIS — G894 Chronic pain syndrome: Secondary | ICD-10-CM

## 2021-01-27 DIAGNOSIS — M899 Disorder of bone, unspecified: Secondary | ICD-10-CM

## 2021-01-27 DIAGNOSIS — Z789 Other specified health status: Secondary | ICD-10-CM

## 2021-01-27 DIAGNOSIS — Z79899 Other long term (current) drug therapy: Secondary | ICD-10-CM

## 2021-02-01 DIAGNOSIS — G4733 Obstructive sleep apnea (adult) (pediatric): Secondary | ICD-10-CM | POA: Diagnosis not present

## 2021-02-03 ENCOUNTER — Telehealth: Payer: Self-pay

## 2021-02-03 DIAGNOSIS — R413 Other amnesia: Secondary | ICD-10-CM
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
## 2021-02-04 NOTE — Telephone Encounter (Signed)
Please advise 

## 2021-02-04 NOTE — Telephone Encounter (Signed)
Obviously keep him isolated from that family member as a first step. As far as memory portion- team please refer to neurology under memory loss. It may take some time to get in but lets get the ball rolling

## 2021-02-04 NOTE — Telephone Encounter (Signed)
[name] is aware of Dr. Ronney Lion Comments, Gave a verbal understanding. I did advise her that we did place a referral for Neurology and they should be giving her a call soon.

## 2021-03-04 DIAGNOSIS — G4733 Obstructive sleep apnea (adult) (pediatric): Secondary | ICD-10-CM | POA: Diagnosis not present

## 2021-03-14 ENCOUNTER — Ambulatory Visit: Payer: Medicare Other | Admitting: Family Medicine

## 2021-03-14 ENCOUNTER — Encounter: Payer: Self-pay | Admitting: Family Medicine

## 2021-03-14 DIAGNOSIS — E890 Postprocedural hypothyroidism: Secondary | ICD-10-CM

## 2021-03-14 DIAGNOSIS — R739 Hyperglycemia, unspecified: Secondary | ICD-10-CM

## 2021-03-14 DIAGNOSIS — I10 Essential (primary) hypertension: Secondary | ICD-10-CM

## 2021-03-14 DIAGNOSIS — R413 Other amnesia: Secondary | ICD-10-CM

## 2021-03-14 DIAGNOSIS — Z13220 Encounter for screening for lipoid disorders: Secondary | ICD-10-CM

## 2021-03-14 DIAGNOSIS — F411 Generalized anxiety disorder: Secondary | ICD-10-CM

## 2021-03-14 DIAGNOSIS — I2782 Chronic pulmonary embolism: Secondary | ICD-10-CM

## 2021-03-14 DIAGNOSIS — G629 Polyneuropathy, unspecified: Secondary | ICD-10-CM

## 2021-03-14 DIAGNOSIS — G4700 Insomnia, unspecified: Secondary | ICD-10-CM

## 2021-03-14 DIAGNOSIS — M5090 Cervical disc disorder, unspecified, unspecified cervical region: Secondary | ICD-10-CM

## 2021-03-14 DIAGNOSIS — I5033 Acute on chronic diastolic (congestive) heart failure: Secondary | ICD-10-CM

## 2021-03-14 DIAGNOSIS — I4892 Unspecified atrial flutter: Secondary | ICD-10-CM

## 2021-03-14 DIAGNOSIS — R531 Weakness: Secondary | ICD-10-CM

## 2021-03-31 ENCOUNTER — Ambulatory Visit: Payer: Medicare Other | Admitting: Family Medicine

## 2021-03-31 DIAGNOSIS — I10 Essential (primary) hypertension: Secondary | ICD-10-CM

## 2021-03-31 DIAGNOSIS — R413 Other amnesia: Secondary | ICD-10-CM

## 2021-03-31 DIAGNOSIS — E890 Postprocedural hypothyroidism: Secondary | ICD-10-CM

## 2021-03-31 DIAGNOSIS — I2782 Chronic pulmonary embolism: Secondary | ICD-10-CM

## 2021-03-31 DIAGNOSIS — J9690 Respiratory failure, unspecified, unspecified whether with hypoxia or hypercapnia: Secondary | ICD-10-CM

## 2021-03-31 DIAGNOSIS — F411 Generalized anxiety disorder: Secondary | ICD-10-CM

## 2021-03-31 DIAGNOSIS — N183 Chronic kidney disease, stage 3 unspecified: Secondary | ICD-10-CM

## 2021-03-31 DIAGNOSIS — Z13220 Encounter for screening for lipoid disorders: Secondary | ICD-10-CM

## 2021-03-31 DIAGNOSIS — I4891 Unspecified atrial fibrillation: Secondary | ICD-10-CM

## 2021-03-31 DIAGNOSIS — R531 Weakness: Secondary | ICD-10-CM

## 2021-03-31 DIAGNOSIS — G4700 Insomnia, unspecified: Secondary | ICD-10-CM

## 2021-03-31 DIAGNOSIS — R739 Hyperglycemia, unspecified: Secondary | ICD-10-CM

## 2021-03-31 DIAGNOSIS — G629 Polyneuropathy, unspecified: Secondary | ICD-10-CM

## 2021-03-31 DIAGNOSIS — M5090 Cervical disc disorder, unspecified, unspecified cervical region: Secondary | ICD-10-CM

## 2021-03-31 DIAGNOSIS — I5033 Acute on chronic diastolic (congestive) heart failure: Secondary | ICD-10-CM

## 2021-03-31 DIAGNOSIS — F325 Major depressive disorder, single episode, in full remission: Secondary | ICD-10-CM

## 2021-04-01 ENCOUNTER — Encounter: Payer: Self-pay | Admitting: Family Medicine

## 2021-04-03 DIAGNOSIS — G4733 Obstructive sleep apnea (adult) (pediatric): Secondary | ICD-10-CM | POA: Diagnosis not present

## 2021-04-26 ENCOUNTER — Other Ambulatory Visit: Payer: Self-pay | Admitting: Family Medicine

## 2021-05-04 DIAGNOSIS — G4733 Obstructive sleep apnea (adult) (pediatric): Secondary | ICD-10-CM | POA: Diagnosis not present

## 2021-05-08 ENCOUNTER — Other Ambulatory Visit: Payer: Self-pay

## 2021-05-08 ENCOUNTER — Encounter: Payer: Self-pay | Admitting: Family Medicine

## 2021-05-08 ENCOUNTER — Ambulatory Visit (INDEPENDENT_AMBULATORY_CARE_PROVIDER_SITE_OTHER): Payer: Medicare Other | Admitting: Family Medicine

## 2021-05-08 ENCOUNTER — Ambulatory Visit (HOSPITAL_COMMUNITY)
Admission: RE | Admit: 2021-05-08 | Discharge: 2021-05-08 | Disposition: A | Discharge status: Home / Self Care | Payer: Medicare Other | Source: Ambulatory Visit | Attending: Family Medicine | Admitting: Family Medicine

## 2021-05-08 VITALS — BP 122/64 | HR 94 | Temp 99.1°F | Ht 71.0 in | Wt 246.4 lb

## 2021-05-08 DIAGNOSIS — R918 Other nonspecific abnormal finding of lung field: Secondary | ICD-10-CM | POA: Diagnosis not present

## 2021-05-08 DIAGNOSIS — R051 Acute cough: Secondary | ICD-10-CM | POA: Diagnosis not present

## 2021-05-08 DIAGNOSIS — J90 Pleural effusion, not elsewhere classified: Secondary | ICD-10-CM | POA: Diagnosis not present

## 2021-05-08 DIAGNOSIS — E890 Postprocedural hypothyroidism: Secondary | ICD-10-CM

## 2021-05-08 DIAGNOSIS — J9611 Chronic respiratory failure with hypoxia: Secondary | ICD-10-CM | POA: Diagnosis not present

## 2021-05-08 DIAGNOSIS — N183 Chronic kidney disease, stage 3 unspecified: Secondary | ICD-10-CM

## 2021-05-08 DIAGNOSIS — R059 Cough, unspecified: Secondary | ICD-10-CM | POA: Diagnosis not present

## 2021-05-08 DIAGNOSIS — I10 Essential (primary) hypertension: Secondary | ICD-10-CM

## 2021-05-08 DIAGNOSIS — R739 Hyperglycemia, unspecified: Secondary | ICD-10-CM

## 2021-05-08 DIAGNOSIS — M10079 Idiopathic gout, unspecified ankle and foot: Secondary | ICD-10-CM | POA: Diagnosis not present

## 2021-05-08 DIAGNOSIS — I4891 Unspecified atrial fibrillation: Secondary | ICD-10-CM

## 2021-05-08 LAB — POC COVID19 BINAXNOW: SARS Coronavirus 2 Ag: NEGATIVE

## 2021-05-08 LAB — POCT INFLUENZA A/B
Influenza A, POC: NEGATIVE
Influenza B, POC: NEGATIVE

## 2021-05-08 MED ORDER — ALBUTEROL SULFATE HFA 108 (90 BASE) MCG/ACT IN AERS: 2.0000 | INHALATION_SPRAY | Freq: Four times a day (QID) | RESPIRATORY_TRACT | 2 refills | Status: DC | PRN

## 2021-05-08 MED ORDER — NORTRIPTYLINE HCL 50 MG PO CAPS: 50.0000 mg | ORAL_CAPSULE | Freq: Every day | ORAL | 3 refills | Status: DC

## 2021-05-08 MED ORDER — TRAZODONE HCL 50 MG PO TABS: ORAL_TABLET | ORAL | 2 refills | Status: DC

## 2021-05-08 MED ORDER — ERYTHROMYCIN 5 MG/GM OP OINT: TOPICAL_OINTMENT | OPHTHALMIC | 0 refills | Status: DC

## 2021-05-08 MED ORDER — PREDNISONE 20 MG PO TABS: ORAL_TABLET | ORAL | 0 refills | Status: DC

## 2021-05-08 NOTE — Progress Notes (Signed)
Phone [phone] In person visit   Subjective:   Wayne Booth is a 80 y.o. year old very pleasant male patient who  presents for/with See problem oriented charting Chief Complaint  Patient presents with   Follow-up    Pt has also stopped taking all medications except buspar and trazadone since last month.   Cough    Pt c/o cough over the weekend and seems winded.    This visit occurred during the SARS-CoV-2 public health emergency.  Safety protocols were in place, including screening questions prior to the visit, additional usage of staff PPE, and extensive cleaning of exam room while observing appropriate contact time as indicated for disinfecting solutions.   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    Priority: High   Pneumonia due to COVID-19 virus 04/20/2019    Priority: High   Memory loss 06/10/2018    Priority: High   Chronic respiratory failure with hypoxia ([place]) 04/26/2018    Priority: High   CAD (coronary artery disease) 04/09/2018    Priority: High   CKD (chronic kidney disease) stage 3, GFR 30-59 ml/min (HCC)     Priority: High   (HFpEF) heart failure with preserved ejection fraction ([place]) 12/16/2017    Priority: High   Chest pain     Priority: High   Atrial flutter ([place]) 11/01/2017    Priority: High   Chronic pulmonary embolism ([place]) 10/23/2016    Priority: High   Asthmatic bronchitis 06/09/2011    Priority: High   History of prostate cancer-seed implantation 2010.  Dr. Alinda Money 10/02/2008    Priority: High   History of renal cell carcinoma 12/26/2007    Priority: High   PTSD (post-traumatic stress disorder) 07/27/2018    Priority: Medium    HCAP (healthcare-associated pneumonia) 02/03/2018    Priority: Medium    History of adenomatous polyp of colon 04/12/2017    Priority: Medium    DDD (degenerative disc disease), cervical 11/13/2015    Priority: Medium     Essential tremor 08/14/2015    Priority: Medium    Gout 08/14/2015    Priority: Medium    Hypothyroidism 07/12/2014    Priority: Medium    Hyperglycemia 07/12/2014    Priority: Medium    GAD (generalized anxiety disorder) 11/07/2013    Priority: Medium    OSA (obstructive sleep apnea) 07/07/2011    Priority: Medium    Morbid obesity ([place]) 10/02/2008    Priority: Medium    Insomnia 12/26/2007    Priority: Medium    Essential hypertension 12/28/2006    Priority: Medium    Aortic atherosclerosis ([place]) 05/10/2018    Priority: Low   First degree AV block 04/15/2018    Priority: Low   Constipation 01/21/2018    Priority: Low   Anticoagulated     Priority: Low   Pain in left knee 07/15/2017    Priority: Low   Bilateral foot pain 07/13/2017    Priority: [place] acquired pneumonia 06/18/2017    Priority: Low   Status post nephrectomy 06/09/2017    Priority: Low   VTE (venous thromboembolism) 06/09/2017    Priority: Low   Actinic keratosis 11/27/2009    Priority: Low   Osteoarthritis 11/13/2008    Priority: Low   TESTOSTERONE DEFICIENCY 12/28/2006    Priority: Low   Pharmacologic therapy 01/26/2021   Disorder of skeletal system 01/26/2021   Problems influencing health status  01/26/2021   Pulmonary nodule 10/13/2019   Hyponatremia 04/15/2018   Pleural effusion 04/14/2018   Chronic pain syndrome 04/09/2018   Dyspnea    History of fusion of cervical spine 04/24/2015   History of total knee replacement, right 12/17/2008   History of total knee replacement, left 10/28/2006    Medications- reviewed and updated Current Outpatient Medications  Medication Sig Dispense Refill   erythromycin ophthalmic ointment One-half inch (1.25 cm) four times daily for 5 to 7 days to affected eye(s) 3.5 g 0   predniSONE (DELTASONE) 20 MG tablet Take 2 pills for 3 days, 1 pill for 4 days 10 tablet 0   albuterol (PROVENTIL) (2.5 MG/3ML) 0.083% nebulizer solution Take 3 mLs (2.5 mg  total) by nebulization every 6 (six) hours as needed for wheezing or shortness of breath. (Patient not taking: No sig reported) 75 mL 3   albuterol (VENTOLIN HFA) 108 (90 Base) MCG/ACT inhaler Inhale 2 puffs into the lungs every 6 (six) hours as needed for wheezing or shortness of breath. 18 g 2   Budeson-Glycopyrrol-Formoterol (BREZTRI AEROSPHERE) 160-9-4.8 MCG/ACT AERO Inhale 2 puffs into the lungs in the morning and at bedtime. (Patient not taking: Reported on 05/08/2021) 10.7 g 6   Budeson-Glycopyrrol-Formoterol (BREZTRI AEROSPHERE) 160-9-4.8 MCG/ACT AERO Inhale 2 puffs into the lungs in the morning and at bedtime. (Patient not taking: Reported on 05/08/2021) 10.7 g 0   busPIRone (BUSPAR) 5 MG tablet TAKE ONE TABLET BY MOUTH TWICE A DAY AS NEEDED FOR ANXIETY (Patient not taking: Reported on 05/08/2021) 180 tablet 0   ELIQUIS 5 MG TABS tablet TAKE ONE TABLET BY MOUTH TWICE A DAY (Patient not taking: Reported on 05/08/2021) 60 tablet 10   escitalopram (LEXAPRO) 20 MG tablet TAKE 1 TABLET BY MOUTH DAILY (Patient not taking: Reported on 05/08/2021) 30 tablet 5   furosemide (LASIX) 40 MG tablet TAKE 1-2 TABLETS BY MOUTH  TWO TIMES A DAY (Patient not taking: Reported on 05/08/2021) 120 tablet 4   gabapentin (NEURONTIN) 400 MG capsule Take 1 capsule (400 mg total) by mouth 3 (three) times daily. (Patient not taking: Reported on 05/08/2021) 90 capsule 4   KLOR-CON M20 20 MEQ tablet TAKE ONE TABLET BY MOUTH TWICE A DAY (Patient not taking: Reported on 05/08/2021) 60 tablet 2   levothyroxine (SYNTHROID) 150 MCG tablet TAKE ONE TABLET BY MOUTH DAILY (Patient not taking: Reported on 05/08/2021) 90 tablet 0   Multiple Vitamins-Minerals (MULTIVITAMINS THER. W/MINERALS) TABS Take 1 tablet by mouth daily. (Patient not taking: No sig reported)     nortriptyline (PAMELOR) 50 MG capsule Take 1 capsule (50 mg total) by mouth at bedtime. 90 capsule 3   propranolol (INDERAL) 10 MG tablet TAKE ONE TABLET BY MOUTH TWICE A  DAY (Patient not taking: Reported on 05/08/2021) 120 tablet 0   traMADol (ULTRAM) 50 MG tablet TAKE 1 TABLET BY MOUTH EVERY 12 HOURS AS NEEDED FOR MODERATE PAIN (Patient not taking: Reported on 05/08/2021) 30 tablet 2   traZODone (DESYREL) 50 MG tablet TAKE 1/2 TO 1 TABLET BY MOUTH AT BEDTIME AS NEEDED FOR SLEEP 90 tablet 2   No current facility-administered medications for this visit.     Objective:  BP 122/64   Pulse 94   Temp 99.1 F (37.3 C)   Ht 5\' 11"  (1.803 m)   Wt 246 lb 6.4 oz (111.8 kg)   SpO2 90%   BMI 34.37 kg/m  Gen: NAD, appears very fatigued CV: No murmurs rubs or gallops.  High normal  heart rate Lungs: Diffuse mild wheeze.  Crackles in right lung base as well as left lung base but significantly greater on the right lung base Abdomen: Obese/protuberant abdomen Ext: no edema Skin: warm, dry     Assessment and Plan   #Respiratory illness S: Patient did not report this to the front desk but reports that has cough, congestion starting on Saturday. Reports cough has been worsening. Feels run down.  Denies feeling poorly prior to that other than baseline trouble breathing. Feels feverish at times with some chills. More short of breath.  -is compliant with Breztri. More wheezing.  Woke up this morning with purulence from the left eye as well as very red eye  A/P: 80 year old male with asthmatic bronchitis presenting with cough, congestion, shortness of breath.  His oxygen level is at 90% but his baseline tends to be in the low 90s.  He has also been off of his Lasix and has CHF history (though no edema and has some weight loss).  Has some crackles in bilateral lower lobes worse on the right-we opted to get a chest x-ray to rule out pneumonia  -test for covid and flu today tested and negative-encouraged repeat home test in 2 to 3 days for COVID - refill albuterol and use every 4-6 hours scheduled for next 24 hours - prednisone prescribed with history of asthmatic bronchitis  but I wonder if patient will need antibiotic as well -See comments under chronic PE  -Patient could have viral conjunctivitis but with amount of discharge do not want to miss bacterial conjunctivitis-treat with erythromycin as below   #Unintentional weight loss S: Last patient patient had ongoing decree strength sinus generalized weakness.  He ordered physical therapy-patient reports feeling stronger with that.  He reported decreased appetite-has been losing weight at that visit and since that time has lost an additional 10 pounds. Down 34 lbs since last November- from last august down over 40 lbs from peak weight  He states just not eating well- lower appetite.  A/P: Strongly considered embarking on unintentional weight loss work-up but I think recent illness was likely through test results all of introversive sed rate and CRP-would prefer follow-up after patient improves and decide at that point if continues to lose weight-some of recent weight loss could be related to his illness and low appetite but does report lower appetite in general  #Heart failure with preserved ejection fraction # Chronic respiratory failure- continues on 02 at night after covid 19 in October 2020 - no daytime oxgyen -In regards to asthmatic bronchitis on betrizi -followed by pulmonology Dr. Elsworth Soho.  -In the past referred to pulmonary rehab. Also has OSA on cpap  S: Medication: furosemide 40 mg BID in the past-he has stopped taking this as well as potassium twice daily over the last month-do not feel he needs it  Edema: None Weight gain: Actually has weight loss Shortness of breath: Endorses  A/P: CHF-get chest x-ray in regards to crackles-pneumonia versus fluid overload though weight is down and no edema Chronic respiratory failure-continue nighttime oxygen Asthmatic bronchitis-flareup as above-treat with prednisone may need antibiotic depending on x-ray results  # Atrial fibrillation #Chronic PE- history PE  following hand surgery In past S: Rate controlled with  propranolol in the past but he stopped this Anticoagulated with  eliquis 5 mg BID in the past but he has stopped this A/P: Atrial fibrillation is rate controlled without medication but reasonable to restart propranolol if no clear CHF exacerbation - For chronic  PE has been off medication for months-restart Eliquis.  Discussed option of scanning with CT angiogram but he declines at this time-wants to proceed to the hospital or perform further work-up only if worsens  #Neuropathy- feet #Cervical disc disease- working to titrate down nortriptyline due to serotonin syndrome risk. Also on gabapentin S: Patient with significant neuropathy in bilateral feet. He had been on gabapentin 400 mg 3 times a day through podiatry-he prefers this over neurology visit -At baseline on nortriptyline 50 mg-half titrated down from 75 mg to try to reduce serotonin syndrome risk. On Lyrica in the past.  He had stopped this as well -Have referred to pain management in the past to help with cervical disc issues as well as neuropathy but this ended up being in [place] and not possible for him -He also had tramadol available for pain which I recommended to use sparingly due to risk of serotonin syndrome with other medications. -B12 has been normal. A1c in prediabetes range. T3 and T4 normal. In the past  Symptoms have been worse off of medication A/P: Poor control - plans to restart gabapentin and nortriptyline (aware of serotonin syndrome risk)   # Depression # Generalized anxiety disorder  S: Medication: Lexapro 20 mg daily (still taking this) and buspirone - stopped nortriptyline- for cervical disc disease primarily (doing ok without this for neck but neuropathy worse) -Also on trazodone for sleep -Also have encouraged therapist-most recently 12/26/2020 A/P: We need to recheck levels when patient is not currently ill-I am thankful he did not stop his Lexapro  or buspirone as he is had some significant issues with depression and anxiety in the past-recheck PHQ-9 and GAD-7 at follow-up -cyclobenzaprine increases risk of serotonin syndrome-avoid -tramadol used only once or twice a week in past-use sparingly ideally  # Hyperglycemia/insulin resistance/prediabetes-peak A1c 6.3 recently but later improved after weight loss S: Medication:  metformin 500 mg daily- off of this Lab Results  Component Value Date   HGBA1C 5.6 11/05/2020   HGBA1C 6.3 07/31/2019   HGBA1C 5.9 05/10/2018  A/P: With weight loss hopefully controlled without medication  # postoperative Hypothyroidism- cannot use TSH S: compliant On thyroid medication-Levothyroxine 150 mg daily  A/P: Update T3 and T4 when patient recovers from current illness  #hypertension S: medication: none right now Prior  Propranolol 10 mg BID, Lasix 40 mg twice daily BP Readings from Last 3 Encounters:  05/08/21 122/64  01/07/21 (!) 130/58  12/26/20 133/83  A/P: doing ok without meds- still want him to restart propranolol regularly and lasix potentially depending on fluid status  #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 MRI of the brain January 2020 largely unremarkable. B12 levels have been normal. Issues even when depression well controlled. -we did not discuss this specifically today-reevaluate when not currently ill  #Gout S: On allopurinol in the past-off of medication in 2022 without obvious recurrence A/P: No reports gout to me today-continue to monitor  #allergies- on singulair in the past-with depression issues recommended to trial off June 2022-appears he has remained off of this and doing reasonably well  Recommended follow up: Return for as needed for new, worsening, persistent symptoms.   Lab/Order  associations:   ICD-10-CM   1. Essential hypertension  I10 CBC with Differential/Platelet    Comprehensive metabolic panel    2. Atrial fibrillation, unspecified type (HCC)  I48.91 CBC  with Differential/Platelet    Comprehensive metabolic panel    3. Stage 3 chronic kidney disease, unspecified whether stage 3a or 3b CKD (HCC)  N18.30     4. Hyperglycemia  R73.9 Hemoglobin A1c    5. Chronic respiratory failure with hypoxia (HCC)  J96.11     6. Acute idiopathic gout of foot, unspecified laterality  M10.079     7. Cough, unspecified type  R05.9 POC COVID-19    POCT Influenza A/B    8. Postoperative hypothyroidism  E89.0 T4, free    T3, free    9. Acute cough  R05.1 DG Chest 2 View      Meds ordered this encounter  Medications   albuterol (VENTOLIN HFA) 108 (90 Base) MCG/ACT inhaler    Sig: Inhale 2 puffs into the lungs every 6 (six) hours as needed for wheezing or shortness of breath.    Dispense:  18 g    Refill:  2   traZODone (DESYREL) 50 MG tablet    Sig: TAKE 1/2 TO 1 TABLET BY MOUTH AT BEDTIME AS NEEDED FOR SLEEP    Dispense:  90 tablet    Refill:  2   nortriptyline (PAMELOR) 50 MG capsule    Sig: Take 1 capsule (50 mg total) by mouth at bedtime.    Dispense:  90 capsule    Refill:  3   predniSONE (DELTASONE) 20 MG tablet    Sig: Take 2 pills for 3 days, 1 pill for 4 days    Dispense:  10 tablet    Refill:  0   erythromycin ophthalmic ointment    Sig: One-half inch (1.25 cm) four times daily for 5 to 7 days to affected eye(s)    Dispense:  3.5 g    Refill:  0    Time Spent: 41 minutes of total time (1:51 PM- 2:32 PM) was spent on the date of the encounter performing the following actions: chart review prior to seeing the patient, obtaining history, performing a medically necessary exam, counseling on the treatment plan, placing orders, and documenting in our EHR.    I,Harris Phan,acting as a Education administrator for [name], MD.,have documented all relevant  documentation on the behalf of [name], MD,as directed by  [name], MD while in the presence of [name], MD.   I, [name], MD, have reviewed all documentation for this visit. The documentation on 05/08/21 for the exam, diagnosis, procedures, and orders are all accurate and complete.   Return precautions advised.  [name], MD

## 2021-05-08 NOTE — Patient Instructions (Addendum)
Health Maintenance Due  Topic Date Due   COVID-19 Vaccine (3 - Pfizer risk series) - Please get this once you have improved from current illness.  10/09/2019   COLONOSCOPY- consider when doing much better - 05/27/2020   INFLUENZA VACCINE  - Please get this once you have improved from current illness.  01/27/2021   Please call back to schedule labs once improved from current illness.  Team test for COVID-19 and Flu today- negative   Refilled albuterol - use this medication as needed.  Start Prednisone tomorrow morning  Restart Eliquis andll other listed meds - I would even consider the lasix right now as could have fluid on lungs-  If you are not seeing improvement please let me know. If your shortness of breath worsens please report to the emergency room immediately.  Please go over to [facility] Radiology in Main Entrance to get x-ray. Doors close at 5 for walk in patients.   If you see anything on the X-ray report that concerns you please call and see what next steps are- I will tell Bevelyn Ngo she can call me tomorrow  I would try to self-isolate at home until you have received another negative home test in about 2 to 3 days for covid    Recommended follow up: Return for as needed for new, worsening, persistent symptoms. If significant worsening please seek care in the ER

## 2021-05-09 ENCOUNTER — Telehealth: Payer: Self-pay

## 2021-05-09 MED ORDER — AMOXICILLIN-POT CLAVULANATE 875-125 MG PO TABS: 1.0000 | ORAL_TABLET | Freq: Two times a day (BID) | ORAL | 0 refills | Status: DC

## 2021-05-09 MED ORDER — AZITHROMYCIN 250 MG PO TABS: ORAL_TABLET | ORAL | 0 refills | Status: AC

## 2021-05-09 NOTE — Telephone Encounter (Signed)
Spoke to pt's wife Bethena Roys, told her Dr. Yong Channel is not here today but Inda Coke, PA has reviewed the x-ray and she said there is concern for pneumonia. Pt needs to start [place] will send to pharmacy now and any worsening symptoms needs to go to the ED. Bethena Roys verbalized understanding.

## 2021-05-09 NOTE — Telephone Encounter (Signed)
I am grateful Wayne Booth reached out to me. I have seen him get really sick with pneumonia in past so I am going to add augmentin to the azithromycin- he should take both. Still go to ED if new or worsening symptoms

## 2021-05-09 NOTE — Telephone Encounter (Signed)
[name] is calling in stating that she was advised that she could call in today and have someone read the x-ray results.

## 2021-05-09 NOTE — Telephone Encounter (Signed)
Spoke to pt's wife Wayne Booth told her Wayne Booth had reached out to Dr. Yong Channel about the x-ray and he responded and would like him to take Augmentin also with the Z-pak. It has been sent to the pharmacy. Wayne Booth verbalized understanding.

## 2021-05-14 ENCOUNTER — Telehealth: Payer: Self-pay

## 2021-05-14 NOTE — Telephone Encounter (Signed)
With lack of improvement-I would like for him to see pulmonology-if they are okay with it team please call over and see if we can get him an appointment hopefully this week

## 2021-05-14 NOTE — Telephone Encounter (Signed)
Pt called wanting to know what Dr Yong Channel would advise. He stated that he saw Dr Yong Channel on 11/10 for pneumonia. He stated that he is not getting better and wants to know what he should. Please Advise.

## 2021-05-14 NOTE — Telephone Encounter (Signed)
Please advise 

## 2021-05-15 ENCOUNTER — Ambulatory Visit: Payer: Medicare Other | Admitting: Pulmonary Disease

## 2021-05-15 NOTE — Telephone Encounter (Signed)
Called and spoke with pt and [facility] and they will see pt today at 10:30 am. Pt made aware.

## 2021-05-16 ENCOUNTER — Ambulatory Visit: Payer: Medicare Other | Admitting: Adult Health

## 2021-05-16 ENCOUNTER — Telehealth: Payer: Self-pay | Admitting: Adult Health

## 2021-05-16 ENCOUNTER — Encounter: Payer: Self-pay | Admitting: Adult Health

## 2021-05-16 ENCOUNTER — Other Ambulatory Visit: Payer: Self-pay

## 2021-05-16 VITALS — BP 102/62 | HR 72 | Temp 97.7°F | Ht 71.5 in | Wt 239.4 lb

## 2021-05-16 DIAGNOSIS — I5030 Unspecified diastolic (congestive) heart failure: Secondary | ICD-10-CM

## 2021-05-16 DIAGNOSIS — I5032 Chronic diastolic (congestive) heart failure: Secondary | ICD-10-CM

## 2021-05-16 DIAGNOSIS — J9611 Chronic respiratory failure with hypoxia: Secondary | ICD-10-CM

## 2021-05-16 DIAGNOSIS — J8410 Pulmonary fibrosis, unspecified: Secondary | ICD-10-CM

## 2021-05-16 DIAGNOSIS — J4550 Severe persistent asthma, uncomplicated: Secondary | ICD-10-CM | POA: Diagnosis not present

## 2021-05-16 LAB — BASIC METABOLIC PANEL
BUN: 17 mg/dL (ref 6–23)
CO2: 30 mEq/L (ref 19–32)
Calcium: 9.2 mg/dL (ref 8.4–10.5)
Chloride: 94 mEq/L — ABNORMAL LOW (ref 96–112)
Creatinine, Ser: 1.1 mg/dL (ref 0.40–1.50)
GFR: 64.12 mL/min (ref >60.00)
Glucose, Bld: 81 mg/dL (ref 70–99)
Potassium: 3.7 mEq/L (ref 3.5–5.1)
Sodium: 131 mEq/L — ABNORMAL LOW (ref 135–145)

## 2021-05-16 LAB — CBC WITH DIFFERENTIAL/PLATELET
Basophils Absolute: 0 10*3/uL (ref 0.0–0.1)
Basophils Relative: 0.2 % (ref 0.0–3.0)
Eosinophils Absolute: 0.2 10*3/uL (ref 0.0–0.7)
Eosinophils Relative: 1.3 % (ref 0.0–5.0)
HCT: 52.2 % — ABNORMAL HIGH (ref 39.0–52.0)
Hemoglobin: 17.5 g/dL — ABNORMAL HIGH (ref 13.0–17.0)
Lymphocytes Relative: 7.3 % — ABNORMAL LOW (ref 12.0–46.0)
Lymphs Abs: 1.2 10*3/uL (ref 0.7–4.0)
MCHC: 33.6 g/dL (ref 30.0–36.0)
MCV: 91 fl (ref 78.0–100.0)
Monocytes Absolute: 1.8 10*3/uL — ABNORMAL HIGH (ref 0.1–1.0)
Monocytes Relative: 11.2 % (ref 3.0–12.0)
Neutro Abs: 12.7 10*3/uL — ABNORMAL HIGH (ref 1.4–7.7)
Neutrophils Relative %: 80 % — ABNORMAL HIGH (ref 43.0–77.0)
Platelets: 408 10*3/uL — ABNORMAL HIGH (ref 150.0–400.0)
RBC: 5.73 Mil/uL (ref 4.22–5.81)
RDW: 13.9 % (ref 11.5–15.5)
WBC: 15.8 10*3/uL — ABNORMAL HIGH (ref 4.0–10.5)

## 2021-05-16 LAB — BRAIN NATRIURETIC PEPTIDE: Pro B Natriuretic peptide (BNP): 40 pg/mL (ref 0.0–100.0)

## 2021-05-16 MED ORDER — BREZTRI AEROSPHERE 160-9-4.8 MCG/ACT IN AERO: 2.0000 | INHALATION_SPRAY | Freq: Two times a day (BID) | RESPIRATORY_TRACT | 0 refills | Status: DC

## 2021-05-16 MED ORDER — AMOXICILLIN-POT CLAVULANATE 875-125 MG PO TABS: 1.0000 | ORAL_TABLET | Freq: Two times a day (BID) | ORAL | 0 refills | Status: AC

## 2021-05-16 MED ORDER — BENZONATATE 200 MG PO CAPS: 200.0000 mg | ORAL_CAPSULE | Freq: Three times a day (TID) | ORAL | 1 refills | Status: DC | PRN

## 2021-05-16 MED ORDER — PREDNISONE 20 MG PO TABS: ORAL_TABLET | ORAL | 0 refills | Status: DC

## 2021-05-16 MED ORDER — ALBUTEROL SULFATE (2.5 MG/3ML) 0.083% IN NEBU: 2.5000 mg | INHALATION_SOLUTION | Freq: Four times a day (QID) | RESPIRATORY_TRACT | 3 refills | Status: DC | PRN

## 2021-05-16 NOTE — Patient Instructions (Addendum)
Extend Augmentin 875mg  Twice daily  for 3 days .  Prednisone 20mg  daily for 5 days , then 10mg  daily for 5 days and then stop .  Mucinex DM Twice daily  As needed  Cough /congestion  Tessalon Three times a day  As needed  cough  Continue on BREZTRI 2 puffs Twice daily  -please take as directed.  Albuterol Inhaler 2 puffs every 4hrs as needed for wheezing .  May use Albuterol Neb every 4-6hr as needed.  Please wear your  Oxygen 2l/m with activity and At bedtime  Low salt diet . Legs elevated .  Weigh daily , keep log .  Advance activity as tolerated  Follow up with Dr. Elsworth Soho  Or Othell Jaime NP in 2 weeks with chest xray and As needed   Please contact office for sooner follow up if symptoms do not improve or worsen or seek emergency care

## 2021-05-16 NOTE — Addendum Note (Signed)
Addended by: Vanessa Barbara on: 05/16/2021 05:28 PM   Modules accepted: Orders

## 2021-05-16 NOTE — Progress Notes (Signed)
@Patient  ID: Wayne Booth, male    DOB: 1940-11-09, 80 y.o.   MRN: [id number]  Chief Complaint  Patient presents with   Follow-up    Referring provider: Marin Olp, MD  HPI: 80 year old male with minimum smoking history followed for asthmatic bronchitis with allergic component, recurrent pneumonia, obstructive sleep apnea (declined CPAP), postinflammatory fibrosis, chronic respiratory failure on oxygen with activity and at bedtime COVID-19 infection October 2020 required hospitalization with a treatment with remdesivir and Decadron Previous history of PE in 2018 on lifelong anticoagulation therapy Medical history significant for pituitary tumor removal, atrial flutter, renal cell carcinoma status post nephrectomy and diastolic heart failure  TEST/EVENTS :  CT chest June 08, 2017 showed a small left lower lobe area of consolidation.     PFT 07/2017 moderate restriction, DLCO 67%   CT chest October 2019 small to moderate bilateral pleural effusions consolidations within the left greater than right lower lobe and lingula.  Hazy density in the left greater than right lower lobe.   2D echo February 04, 2018 showed EF 50 to 55%   12/2018 HRCT Chest -patchy parenchymal groundglass with interstitial coarsening bilaterally similar to 2019 from a multi lobar pneumonia felt to be a component of postinflammatory changes not consistent with UIP.   07/2019 Eosinophils 100>700>800  IGE  367  ANA, CCP , ANCA neg . RA 16.7    Sleep study September 18, 2019 showed moderate sleep apnea with AHI at 26/hour SPO2 low at 82%   PFT shows a decline in lung function with FEV1 at 41%, ratio 88, FVC 34%, no significant bronchodilator response, DLCO 61%.  (Previous DLCO 67%, FEV1 61%-2019)  High-resolution CT chest January 16, 2021 fibrotic interstitial lung disease, without frank honeycombing mildly progressed since July 2021.  Mild basilar predominance and groundglass predominance.  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  05/16/2021 Follow up : Asthma, postinflammatory fibrosis, history of PE, chronic respiratory failure on oxygen with activity and at bedtime Patient returns for a 77-month follow-up.  Patient has underlying asthma. Patient complains over last 2 weeks with increased cough,congestion. Seen by PCP , treated for pneumonia with Augmentin x 7 days , last dose yesterday . Covid and Flu test neg.  Last visit patient was having more shortness of breath with activities and decreased activity tolerance.  He was set up for a high-resolution CT chest that showed fibrotic NSIP with mild progression since July 2021 felt secondary to previous COVID 19 infection.  Patient was recommended to have autoimmune labs however he did not return for these. Appetite is low , weight is down 40lbs over last year.  He remains on oxygen with activity and at bedtime.  Allergies  Allergen Reactions   Ambien [Zolpidem] Anxiety    Anxiety and agitation when tried as sleeper in hospital aug 2019    Immunization History  Administered Date(s) Administered   Fluad Quad(high Dose 65+) 03/16/2019, 05/14/2020   Influenza Split 04/01/2011, 03/29/2012   Influenza Whole 04/12/2007, 03/18/2009, 03/04/2010   Influenza, High Dose Seasonal PF 04/03/2014, 04/04/2015, 04/24/2016, 04/12/2017, 04/26/2018   Influenza,inj,Quad PF,6+ Mos 03/22/2013   Influenza-Unspecified 05/29/2017   PFIZER(Purple Top)SARS-COV-2 Vaccination 08/19/2019, 09/11/2019   PNEUMOCOCCAL CONJUGATE-20 12/26/2020   Pneumococcal Conjugate-13 11/12/2014   Pneumococcal Polysaccharide-23 06/09/2011   Pneumococcal-Unspecified 06/30/2015   Td 06/29/1994   Tdap 06/09/2011   Zoster Recombinat (Shingrix) 03/16/2019, 07/19/2019   Zoster, Live 09/22/2007    Past Medical History:  Diagnosis Date   Allergy    States his nose runs when he is around grease.    Anxiety    Arthritis    Atrial  fibrillation (HCC)    Cancer ([place])    Coronary artery disease    History of total knee replacement    Hypertension    Hypothyroidism    Kidney cancer, primary, with metastasis from kidney to other site [facility])    Kidney stone    OSA (obstructive sleep apnea)    pt does not wear cpap at night   Pituitary cyst ([place])    Pneumonia    hx of   Prostate cancer ([place])     Tobacco History: Social History   Tobacco Use  Smoking Status Never  Smokeless Tobacco Never   Counseling given: Not Answered   Outpatient Medications Prior to Visit  Medication Sig Dispense Refill   albuterol (VENTOLIN HFA) 108 (90 Base) MCG/ACT inhaler Inhale 2 puffs into the lungs every 6 (six) hours as needed for wheezing or shortness of breath. 18 g 2   Budeson-Glycopyrrol-Formoterol (BREZTRI AEROSPHERE) 160-9-4.8 MCG/ACT AERO Inhale 2 puffs into the lungs in the morning and at bedtime. 10.7 g 6   busPIRone (BUSPAR) 5 MG tablet TAKE ONE TABLET BY MOUTH TWICE A DAY AS NEEDED FOR ANXIETY 180 tablet 0   ELIQUIS 5 MG TABS tablet TAKE ONE TABLET BY MOUTH TWICE A DAY 60 tablet 10   erythromycin ophthalmic ointment One-half inch (1.25 cm) four times daily for 5 to 7 days to affected eye(s) 3.5 g 0   escitalopram (LEXAPRO) 20 MG tablet TAKE 1 TABLET BY MOUTH DAILY 30 tablet 5   furosemide (LASIX) 40 MG tablet TAKE 1-2 TABLETS BY MOUTH  TWO TIMES A DAY 120 tablet 4   gabapentin (NEURONTIN) 400 MG capsule Take 1 capsule (400 mg total) by mouth 3 (three) times daily. 90 capsule 4   KLOR-CON M20 20 MEQ tablet TAKE ONE TABLET BY MOUTH TWICE A DAY 60 tablet 2   levothyroxine (SYNTHROID) 150 MCG tablet TAKE ONE TABLET BY MOUTH DAILY 90 tablet 0   Multiple Vitamins-Minerals (MULTIVITAMINS THER. W/MINERALS) TABS Take 1 tablet by mouth daily.     nortriptyline (PAMELOR) 50 MG capsule Take 1 capsule (50 mg total) by mouth at bedtime. 90 capsule 3   propranolol (INDERAL) 10 MG tablet TAKE ONE TABLET BY MOUTH TWICE A DAY 120 tablet 0    traMADol (ULTRAM) 50 MG tablet TAKE 1 TABLET BY MOUTH EVERY 12 HOURS AS NEEDED FOR MODERATE PAIN 30 tablet 2   traZODone (DESYREL) 50 MG tablet TAKE 1/2 TO 1 TABLET BY MOUTH AT BEDTIME AS NEEDED FOR SLEEP 90 tablet 2   albuterol (PROVENTIL) (2.5 MG/3ML) 0.083% nebulizer solution Take 3 mLs (2.5 mg total) by nebulization every 6 (six) hours as needed for wheezing or shortness of breath. 75 mL 3   amoxicillin-clavulanate (AUGMENTIN) 875-125 MG tablet Take 1 tablet by mouth 2 (two) times daily for 7 days. (Patient not taking: Reported on 05/16/2021) 14 tablet 0   Budeson-Glycopyrrol-Formoterol (BREZTRI AEROSPHERE) 160-9-4.8 MCG/ACT AERO Inhale 2 puffs into the lungs in the morning and at bedtime. (Patient not taking: Reported on 05/08/2021) 10.7 g 0   predniSONE (DELTASONE) 20 MG tablet Take 2 pills for 3 days, 1 pill for 4 days (Patient not taking: Reported on 05/16/2021) 10 tablet 0   No facility-administered medications prior to visit.     Review of Systems:   Constitutional:   No  weight loss, night sweats,  Fevers, chills, fatigue, or  lassitude.  HEENT:   No headaches,  Difficulty swallowing,  Tooth/dental problems, or  Sore throat,                No sneezing, itching, ear ache, nasal congestion, post nasal drip,   CV:  No chest pain,  Orthopnea, PND, swelling in lower extremities, anasarca, dizziness, palpitations, syncope.   GI  No heartburn, indigestion, abdominal pain, nausea, vomiting, diarrhea, change in bowel habits, loss of appetite, bloody stools.   Resp: No shortness of breath with exertion or at rest.  No excess mucus, no productive cough,  No non-productive cough,  No coughing up of blood.  No change in color of mucus.  No wheezing.  No chest wall deformity  Skin: no rash or lesions.  GU: no dysuria, change in color of urine, no urgency or frequency.  No flank pain, no hematuria   MS:  No joint pain or swelling.  No decreased range of motion.  No back  pain.    Physical Exam  BP 102/62 (BP Location: Right Arm, Patient Position: Sitting, Cuff Size: Large)   Pulse 72   Temp 97.7 F (36.5 C) (Oral)   Ht 5' 11.5" (1.816 m)   Wt 239 lb 6.4 oz (108.6 kg)   SpO2 92%   BMI 32.92 kg/m   GEN: A/Ox3; pleasant , NAD, well nourished    HEENT:  [place]/AT,  EACs-clear, TMs-wnl, NOSE-clear, THROAT-clear, no lesions, no postnasal drip or exudate noted.   NECK:  Supple w/ fair ROM; no JVD; normal carotid impulses w/o bruits; no thyromegaly or nodules palpated; no lymphadenopathy.    RESP  Clear  P & A; w/o, wheezes/ rales/ or rhonchi. no accessory muscle use, no dullness to percussion  CARD:  RRR, no m/r/g, no peripheral edema, pulses intact, no cyanosis or clubbing.  GI:   Soft & nt; nml bowel sounds; no organomegaly or masses detected.   Musco: Warm bil, no deformities or joint swelling noted.   Neuro: alert, no focal deficits noted.    Skin: Warm, no lesions or rashes    Lab Results:  CBC    Component Value Date/Time   WBC 7.2 11/05/2020 1117   RBC 5.43 11/05/2020 1117   HGB 16.6 11/05/2020 1117   HGB 16.6 07/12/2019 1509   HCT 49.5 11/05/2020 1117   HCT 49.6 07/12/2019 1509   PLT 322.0 11/05/2020 1117   PLT 383 07/12/2019 1509   MCV 91.2 11/05/2020 1117   MCV 91 07/12/2019 1509   MCH 30.4 07/12/2019 1509   MCH 31.0 05/29/2019 1628   MCHC 33.6 11/05/2020 1117   RDW 15.4 11/05/2020 1117   RDW 13.2 07/12/2019 1509   LYMPHSABS 1.1 11/05/2020 1117   MONOABS 0.7 11/05/2020 1117   EOSABS 0.3 11/05/2020 1117   BASOSABS 0.0 11/05/2020 1117    BMET    Component Value Date/Time   NA 138 12/26/2020 1638   NA 138 07/12/2019 1509   K 3.8 12/26/2020 1638   CL 102 12/26/2020 1638   CO2 25 12/26/2020 1638   GLUCOSE 126 (H) 12/26/2020 1638   BUN 11 12/26/2020 1638   BUN 15 07/12/2019 1509   CREATININE 0.97 12/26/2020 1638   CREATININE 1.16 11/27/2016 1555   CALCIUM 9.1 12/26/2020 1638   GFRNONAA 62 07/12/2019 1509   GFRAA  72 07/12/2019 1509    BNP    Component Value Date/Time   BNP 55.3 05/29/2019 1628  ProBNP    Component Value Date/Time   PROBNP 31.0 03/15/2019 1209    Imaging: DG Chest 2 View  Result Date: 05/09/2021 CLINICAL DATA:  Crackling in the lungs RIGHT greater than LEFT inferiorly, fluid overload question, history CHF, patient is off Lasix EXAM: CHEST - 2 VIEW COMPARISON:  11/01/2019 FINDINGS: Enlargement of cardiac silhouette. Mediastinal contours normal. Atherosclerotic calcifications aorta. Small BILATERAL pleural effusions. BILATERAL pulmonary infiltrates greater at RIGHT upper lobe and at LEFT base, may represent asymmetric edema or multifocal pneumonia. No pneumothorax or acute osseous findings. Scattered endplate spur formation thoracic spine. IMPRESSION: Enlargement of cardiac silhouette with small bibasilar pleural effusions, greater on LEFT. Patchy infiltrates in both lungs greater in RIGHT upper lobe and LEFT lower lobe, question asymmetric edema versus multifocal pneumonia. Electronically Signed   By: Lavonia Dana M.D.   On: 05/09/2021 10:19      PFT Results Latest Ref Rng & Units 10/10/2019 08/11/2017  FVC-Pre L 1.43 2.20  FVC-Predicted Pre % 35 54  FVC-Post L 1.36 2.16  FVC-Predicted Post % 34 53  Pre FEV1/FVC % % 84 79  Post FEV1/FCV % % 88 84  FEV1-Pre L 1.20 1.74  FEV1-Predicted Pre % 41 59  FEV1-Post L 1.20 1.81  DLCO uncorrected ml/min/mmHg 14.73 20.88  DLCO UNC% % 61 67  DLCO corrected ml/min/mmHg 14.73 -  DLCO COR %Predicted % 61 -  DLVA Predicted % 129 132  TLC L 3.45 4.77  TLC % Predicted % 50 69  RV % Predicted % 71 96    No results found for: NITRICOXIDE      Assessment & Plan:   Asthmatic bronchitis Slow to resolve exacerbation with possible CAP/+ [facility] decompensation   Plan Patient Instructions  Extend Augmentin 875mg  Twice daily  for 3 days .  Prednisone 20mg  daily for 5 days , then 10mg  daily for 5 days and then stop .  Mucinex DM Twice  daily  As needed  Cough /congestion  Tessalon Three times a day  As needed  cough  Continue on BREZTRI 2 puffs Twice daily  -please take as directed.  Albuterol Inhaler 2 puffs every 4hrs as needed for wheezing .  May use Albuterol Neb every 4-6hr as needed.  Please wear your  Oxygen 2l/m with activity and At bedtime  Low salt diet . Legs elevated .  Weigh daily , keep log .  Advance activity as tolerated  Follow up with Dr. Elsworth Soho  Or Garrie Woodin NP in 2 weeks with chest xray and As needed   Please contact office for sooner follow up if symptoms do not improve or worsen or seek emergency care           (HFpEF) heart failure with preserved ejection fraction ([place]) Appears Euvolemic on exam, Chest xray shows small effusions/ possible edema  Check BNP  Continue on Lasix .   Plan Patient Instructions  Extend Augmentin 875mg  Twice daily  for 3 days .  Prednisone 20mg  daily for 5 days , then 10mg  daily for 5 days and then stop .  Mucinex DM Twice daily  As needed  Cough /congestion  Tessalon Three times a day  As needed  cough  Continue on BREZTRI 2 puffs Twice daily  -please take as directed.  Albuterol Inhaler 2 puffs every 4hrs as needed for wheezing .  May use Albuterol Neb every 4-6hr as needed.  Please wear your  Oxygen 2l/m with activity and At bedtime  Low salt diet . Legs elevated .  Weigh daily , keep log .  Advance activity as tolerated  Follow up with Dr. Elsworth Soho  Or Aviya Jarvie NP in 2 weeks with chest xray and As needed   Please contact office for sooner follow up if symptoms do not improve or worsen or seek emergency care           Chronic respiratory failure with hypoxia ([place]) Encouraged on O2 compliance   Plan  Patient Instructions  Extend Augmentin 875mg  Twice daily  for 3 days .  Prednisone 20mg  daily for 5 days , then 10mg  daily for 5 days and then stop .  Mucinex DM Twice daily  As needed  Cough /congestion  Tessalon Three times a day  As needed  cough   Continue on BREZTRI 2 puffs Twice daily  -please take as directed.  Albuterol Inhaler 2 puffs every 4hrs as needed for wheezing .  May use Albuterol Neb every 4-6hr as needed.  Please wear your  Oxygen 2l/m with activity and At bedtime  Low salt diet . Legs elevated .  Weigh daily , keep log .  Advance activity as tolerated  Follow up with Dr. 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  Symptoms have worsened clinically after COVID-19.  Most recent CT chest showed progressive changes.  Autoimmune and connective tissue labs today. Continue on oxygen.  Plan  Patient Instructions  Extend Augmentin 875mg  Twice daily  for 3 days .  Prednisone 20mg  daily for 5 days , then 10mg  daily for 5 days and then stop .  Mucinex DM Twice daily  As needed  Cough /congestion  Tessalon Three times a day  As needed  cough  Continue on BREZTRI 2 puffs Twice daily  -please take as directed.  Albuterol Inhaler 2 puffs every 4hrs as needed for wheezing .  May use Albuterol Neb every 4-6hr as needed.  Please wear your  Oxygen 2l/m with activity and At bedtime  Low salt diet . Legs elevated .  Weigh daily , keep log .  Advance activity as tolerated  Follow up with Dr. Elsworth Soho  Or Diaz Crago NP in 2 weeks with chest xray and As needed   Please contact office for sooner follow up if symptoms do not improve or worsen or seek emergency care             Rexene Edison, NP 05/16/2021

## 2021-05-16 NOTE — Telephone Encounter (Signed)
Reviewed the AVS and TP stated for the patient to extend the Augmentin twice daily for the next 3 days. Called and spoke with Levada Dy. She will correct the RX.   Nothing further needed at time of call.

## 2021-05-16 NOTE — Assessment & Plan Note (Signed)
Encouraged on O2 compliance   Plan  Patient Instructions  Extend Augmentin 875mg  Twice daily  for 3 days .  Prednisone 20mg  daily for 5 days , then 10mg  daily for 5 days and then stop .  Mucinex DM Twice daily  As needed  Cough /congestion  Tessalon Three times a day  As needed  cough  Continue on BREZTRI 2 puffs Twice daily  -please take as directed.  Albuterol Inhaler 2 puffs every 4hrs as needed for wheezing .  May use Albuterol Neb every 4-6hr as needed.  Please wear your  Oxygen 2l/m with activity and At bedtime  Low salt diet . Legs elevated .  Weigh daily , keep log .  Advance activity as tolerated  Follow up with Dr. Elsworth Soho  Or Roshawnda Pecora NP in 2 weeks with chest xray and As needed   Please contact office for sooner follow up if symptoms do not improve or worsen or seek emergency care

## 2021-05-16 NOTE — Assessment & Plan Note (Signed)
Progressive fibrotic NSIP/postinflammatory fibrosis.  Symptoms have worsened clinically after COVID-19.  Most recent CT chest showed progressive changes.  Autoimmune and connective tissue labs today. Continue on oxygen.  Plan  Patient Instructions  Extend Augmentin 875mg  Twice daily  for 3 days .  Prednisone 20mg  daily for 5 days , then 10mg  daily for 5 days and then stop .  Mucinex DM Twice daily  As needed  Cough /congestion  Tessalon Three times a day  As needed  cough  Continue on BREZTRI 2 puffs Twice daily  -please take as directed.  Albuterol Inhaler 2 puffs every 4hrs as needed for wheezing .  May use Albuterol Neb every 4-6hr as needed.  Please wear your  Oxygen 2l/m with activity and At bedtime  Low salt diet . Legs elevated .  Weigh daily , keep log .  Advance activity as tolerated  Follow up with Dr. Elsworth Soho  Or Monterio Bob NP in 2 weeks with chest xray and As needed   Please contact office for sooner follow up if symptoms do not improve or worsen or seek emergency care

## 2021-05-16 NOTE — Assessment & Plan Note (Signed)
Appears Euvolemic on exam, Chest xray shows small effusions/ possible edema  Check BNP  Continue on Lasix .   Plan Patient Instructions  Extend Augmentin 875mg  Twice daily  for 3 days .  Prednisone 20mg  daily for 5 days , then 10mg  daily for 5 days and then stop .  Mucinex DM Twice daily  As needed  Cough /congestion  Tessalon Three times a day  As needed  cough  Continue on BREZTRI 2 puffs Twice daily  -please take as directed.  Albuterol Inhaler 2 puffs every 4hrs as needed for wheezing .  May use Albuterol Neb every 4-6hr as needed.  Please wear your  Oxygen 2l/m with activity and At bedtime  Low salt diet . Legs elevated .  Weigh daily , keep log .  Advance activity as tolerated  Follow up with Dr. Elsworth Soho  Or Mayelin Panos NP in 2 weeks with chest xray and As needed   Please contact office for sooner follow up if symptoms do not improve or worsen or seek emergency care

## 2021-05-16 NOTE — Assessment & Plan Note (Addendum)
Slow to resolve exacerbation with possible CAP/+ [facility] decompensation   Plan Patient Instructions  Extend Augmentin 875mg  Twice daily  for 3 days .  Prednisone 20mg  daily for 5 days , then 10mg  daily for 5 days and then stop .  Mucinex DM Twice daily  As needed  Cough /congestion  Tessalon Three times a day  As needed  cough  Continue on BREZTRI 2 puffs Twice daily  -please take as directed.  Albuterol Inhaler 2 puffs every 4hrs as needed for wheezing .  May use Albuterol Neb every 4-6hr as needed.  Please wear your  Oxygen 2l/m with activity and At bedtime  Low salt diet . Legs elevated .  Weigh daily , keep log .  Advance activity as tolerated  Follow up with Dr. Elsworth Soho  Or Callen Zuba NP in 2 weeks with chest xray and As needed   Please contact office for sooner follow up if symptoms do not improve or worsen or seek emergency care

## 2021-05-18 LAB — ANGIOTENSIN CONVERTING ENZYME: Angio Convert Enzyme: 33 U/L (ref 14–82)

## 2021-05-19 ENCOUNTER — Telehealth: Payer: Self-pay | Admitting: Adult Health

## 2021-05-19 NOTE — Progress Notes (Signed)
ATC x1, LM to return call.

## 2021-05-19 NOTE — Telephone Encounter (Signed)
Melvenia Needles, NP  05/16/2021  4:51 PM EST     Labs are consistent with an underlying infection with asthmatic bronchitis exacerbation/possible pneumonia.  Continue with office visit recommendations with antibiotics and steroids. Can repeat CBC in 4 weeks Heart failure marker is normal Electrolytes show sodium is slightly low.  We will continue to follow.  Kidney function is stable    LMTCB for [facility]

## 2021-05-19 NOTE — Telephone Encounter (Signed)
Called and spoke with pt's wife Jeral Fruit letting her know the results of labwork and she verbalized understanding. Nothing further needed.

## 2021-05-20 LAB — ANA: Anti Nuclear Antibody (ANA): POSITIVE — AB

## 2021-05-20 LAB — ANTI-DNA ANTIBODY, DOUBLE-STRANDED: ds DNA Ab: 3 IU/mL

## 2021-05-20 LAB — ANTI-NUCLEAR AB-TITER (ANA TITER): ANA Titer 1: 1:320 {titer} — ABNORMAL HIGH

## 2021-05-20 LAB — CYCLIC CITRUL PEPTIDE ANTIBODY, IGG: Cyclic Citrullin Peptide Ab: <16 UNITS

## 2021-05-20 LAB — RHEUMATOID FACTOR: Rheumatoid fact SerPl-aCnc: 25 IU/mL — ABNORMAL HIGH (ref <14)

## 2021-05-23 NOTE — Progress Notes (Signed)
Called and spoke with patient's wife, [name] [facility]), advised of results/recommendations per Rexene Edison NP.  She verbalized understanding.  Patient has a f/u in place with Dr. Elsworth Soho 05/30/21 at 2:15 pm.  Rheumatology referral placed.

## 2021-05-30 ENCOUNTER — Ambulatory Visit: Payer: Medicare Other | Admitting: Pulmonary Disease

## 2021-06-03 DIAGNOSIS — G4733 Obstructive sleep apnea (adult) (pediatric): Secondary | ICD-10-CM | POA: Diagnosis not present

## 2021-06-04 ENCOUNTER — Telehealth: Payer: Self-pay

## 2021-06-04 ENCOUNTER — Other Ambulatory Visit: Payer: Self-pay | Admitting: Family Medicine

## 2021-06-04 NOTE — Telephone Encounter (Signed)
Ok to schedule pt for flu shot next week.

## 2021-06-04 NOTE — Telephone Encounter (Signed)
[name] is calling in stating that Anshul is feeling better, they are wanting to know when it is okay to schedule for a flu shot.

## 2021-06-05 NOTE — Telephone Encounter (Signed)
LVM advising to schedule flu shot.

## 2021-06-06 ENCOUNTER — Ambulatory Visit: Payer: Medicare Other | Admitting: Adult Health

## 2021-06-10 ENCOUNTER — Ambulatory Visit (INDEPENDENT_AMBULATORY_CARE_PROVIDER_SITE_OTHER): Payer: Medicare Other

## 2021-06-10 ENCOUNTER — Other Ambulatory Visit: Payer: Self-pay

## 2021-06-10 DIAGNOSIS — Z23 Encounter for immunization: Secondary | ICD-10-CM

## 2021-07-02 ENCOUNTER — Encounter: Payer: Self-pay | Admitting: Family Medicine

## 2021-07-02 ENCOUNTER — Other Ambulatory Visit: Payer: Self-pay

## 2021-07-02 ENCOUNTER — Ambulatory Visit (INDEPENDENT_AMBULATORY_CARE_PROVIDER_SITE_OTHER): Payer: Medicare Other | Admitting: Family Medicine

## 2021-07-02 VITALS — BP 124/70 | HR 86 | Temp 98.1°F | Ht 71.0 in | Wt 246.4 lb

## 2021-07-02 DIAGNOSIS — M79671 Pain in right foot: Secondary | ICD-10-CM | POA: Diagnosis not present

## 2021-07-02 MED ORDER — HYDROCODONE-ACETAMINOPHEN 5-325 MG PO TABS: 1.0000 | ORAL_TABLET | Freq: Four times a day (QID) | ORAL | Status: DC | PRN

## 2021-07-02 MED ORDER — PREDNISONE 20 MG PO TABS: 40.0000 mg | ORAL_TABLET | Freq: Every day | ORAL | 0 refills | Status: AC

## 2021-07-02 MED ORDER — HYDROCODONE-ACETAMINOPHEN 10-325 MG PO TABS
1.0000 | ORAL_TABLET | Freq: Four times a day (QID) | ORAL | 0 refills | Status: AC | PRN
Start: 2021-07-02 — End: 2021-07-05

## 2021-07-02 NOTE — Progress Notes (Signed)
Subjective:     Patient ID: Wayne Booth, male    DOB: [birth year], 81 y.o.   MRN: [id number]  Chief Complaint  Patient presents with   Foot Swelling    Left foot pain and swelling that started 1 week ago    HPI-here w/wife H/o gout R foot pain for "while" but worse past 1 wk.  Swollen/red.  Wife inc lasix and some better.  Has bad neuropathy- nothing working.  No injury Stopped allopurinol long time ago.   No inc SOB-chronic. No f/c.  Health Maintenance Due  Topic Date Due   COLONOSCOPY (Pts 45-68yrs Insurance coverage will need to be confirmed)  05/27/2020   COVID-19 Vaccine (4 - Booster for Pfizer series) 08/04/2020   TETANUS/TDAP  06/08/2021    Past Medical History:  Diagnosis Date   Allergy    States his nose runs when he is around grease.    Anxiety    Arthritis    Atrial fibrillation (HCC)    Cancer (HCC)    Coronary artery disease    History of total knee replacement    Hypertension    Hypothyroidism    Kidney cancer, primary, with metastasis from kidney to other site [facility])    Kidney stone    OSA (obstructive sleep apnea)    pt does not wear cpap at night   Pituitary cyst ([place])    Pneumonia    hx of   Prostate cancer [facility])     Past Surgical History:  Procedure Laterality Date   ABLATION OF DYSRHYTHMIC FOCUS  01/28/2018   ANTERIOR CERVICAL DECOMP/DISCECTOMY FUSION N/A 08/29/2012   Procedure: ANTERIOR CERVICAL DECOMPRESSION/DISCECTOMY FUSION 1 LEVEL;  Surgeon: Eustace Moore, MD;  Location: [place] [facility]UR[facility] OR[facility];  Service: Neurosurgery;  Laterality: N/A;   APPENDECTOMY     ATRIAL FIBRILLATION ABLATION N/A 01/28/2018   Procedure: ATRIAL FIBRILLATION ABLATION;  Surgeon: Constance Haw, MD;  Location: [place] CV LAB;  Service: Cardiovascular;  Laterality: N/A;   CARDIAC CATHETERIZATION  2008   clean   CHOLECYSTECTOMY     COLONOSCOPY W/ POLYPECTOMY     CRANIOTOMY N/A 11/08/2012   Procedure: CRANIOTOMY HYPOPHYSECTOMY TRANSNASAL APPROACH;  Surgeon: Faythe Ghee, MD;  Location: [place] [facility]UR[facility] ORS;  Service: Neurosurgery;  Laterality: N/A;  Transphenoidal Resection of Pituitary Tumor   FINGER SURGERY Left 2017   Dr Amedeo Plenty   INSERTION PROSTATE RADIATION SEED     IR THORACENTESIS ASP PLEURAL SPACE W/IMG GUIDE  04/15/2018   KIDNEY STONE SURGERY     KNEE ARTHROSCOPY  2007   left   NEPHRECTOMY Left    POSTERIOR CERVICAL LAMINECTOMY Left 04/24/2015   Procedure: Foraminotomy cervical five - cervical six cervical six - cervical seven left;  Surgeon: Eustace Moore, MD;  Location: [facility];  Service: Neurosurgery;  Laterality: Left;   REPLACEMENT TOTAL KNEE BILATERAL     RIGHT/LEFT HEART CATH AND CORONARY ANGIOGRAPHY N/A 07/14/2019   Procedure: RIGHT/LEFT HEART CATH AND CORONARY ANGIOGRAPHY;  Surgeon: Jettie Booze, MD;  Location: [place] CV LAB;  Service: Cardiovascular;  Laterality: N/A;    Outpatient Medications Prior to Visit  Medication Sig Dispense Refill   albuterol (PROVENTIL) (2.5 MG/3ML) 0.083% nebulizer solution Take 3 mLs (2.5 mg total) by nebulization every 6 (six) hours as needed for wheezing or shortness of breath. 75 mL 3   albuterol (VENTOLIN HFA) 108 (90 Base) MCG/ACT inhaler Inhale 2 puffs into the lungs every 6 (six) hours as needed for  wheezing or shortness of breath. 18 g 2   benzonatate (TESSALON) 200 MG capsule Take 1 capsule (200 mg total) by mouth 3 (three) times daily as needed for cough. 30 capsule 1   Budeson-Glycopyrrol-Formoterol (BREZTRI AEROSPHERE) 160-9-4.8 MCG/ACT AERO Inhale 2 puffs into the lungs in the morning and at bedtime. 10.7 g 6   Budeson-Glycopyrrol-Formoterol (BREZTRI AEROSPHERE) 160-9-4.8 MCG/ACT AERO Inhale 2 puffs into the lungs in the morning and at bedtime. 5.9 g 0   busPIRone (BUSPAR) 5 MG tablet TAKE ONE TABLET BY MOUTH TWICE A DAY AS NEEDED FOR ANXIETY 180 tablet 0   ELIQUIS 5 MG TABS tablet TAKE ONE TABLET BY MOUTH TWICE A DAY 60 tablet 10   erythromycin ophthalmic ointment One-half inch  (1.25 cm) four times daily for 5 to 7 days to affected eye(s) 3.5 g 0   escitalopram (LEXAPRO) 20 MG tablet TAKE ONE TABLET BY MOUTH DAILY 30 tablet 5   furosemide (LASIX) 40 MG tablet TAKE 1-2 TABLETS BY MOUTH  TWO TIMES A DAY 120 tablet 4   gabapentin (NEURONTIN) 400 MG capsule Take 1 capsule (400 mg total) by mouth 3 (three) times daily. 90 capsule 4   KLOR-CON M20 20 MEQ tablet TAKE ONE TABLET BY MOUTH TWICE A DAY 60 tablet 2   levothyroxine (SYNTHROID) 150 MCG tablet TAKE ONE TABLET BY MOUTH DAILY 90 tablet 0   Multiple Vitamins-Minerals (MULTIVITAMINS THER. W/MINERALS) TABS Take 1 tablet by mouth daily.     nortriptyline (PAMELOR) 50 MG capsule Take 1 capsule (50 mg total) by mouth at bedtime. 90 capsule 3   propranolol (INDERAL) 10 MG tablet TAKE ONE TABLET BY MOUTH TWICE A DAY 120 tablet 0   traMADol (ULTRAM) 50 MG tablet TAKE 1 TABLET BY MOUTH EVERY 12 HOURS AS NEEDED FOR MODERATE PAIN 30 tablet 2   traZODone (DESYREL) 50 MG tablet TAKE 1/2 TO 1 TABLET BY MOUTH AT BEDTIME AS NEEDED FOR SLEEP 90 tablet 2   predniSONE (DELTASONE) 20 MG tablet Begin 1 tablet daily for 5 days then 1/2 tab daily for 5 days , then stop . 10 tablet 0   No facility-administered medications prior to visit.    Allergies  Allergen Reactions   Ambien [Zolpidem] Anxiety    Anxiety and agitation when tried as sleeper in hospital aug 2019   GTX:[id number] except as noted in HPI      Objective:     BP 124/70    Pulse 86    Temp 98.1 F (36.7 C) (Temporal)    Ht 5\' 11"  (1.803 m)    Wt 246 lb 6 oz (111.8 kg)    SpO2 96%    BMI 34.36 kg/m  Wt Readings from Last 3 Encounters:  07/02/21 246 lb 6 oz (111.8 kg)  05/16/21 239 lb 6.4 oz (108.6 kg)  05/08/21 246 lb 6.4 oz (111.8 kg)        Gen: WDWN NAD OWM HEENT: NCAT, conjunctiva not injected, sclera nonicteric, ectopic L lower eyelid NECK:  supple, no thyromegaly, no nodes, no carotid bruits CARDIAC: RRR, S1S2+, no murmur. DP 2+B LUNGS:  CTAB. No wheezes but distant EXT:  no edema x R foot-mild.  Red, tender Skin-very dry and thick/scaley MSK: toes crooked.  NEURO: A&O x3.  CN II-XII intact.  PSYCH: normal mood. Good eye contact  Assessment & Plan:   Problem List Items Addressed This Visit   None Visit Diagnoses     Foot pain, right    -  Primary  R foot pain-suspect gout.  Improving some.  Will do pred and hydrocodone.  No tramadol while on hydro.    Meds ordered this encounter  Medications   predniSONE (DELTASONE) 20 MG tablet    Sig: Take 2 tablets (40 mg total) by mouth daily with breakfast for 5 days.    Dispense:  10 tablet    Refill:  0   DISCONTD: HYDROcodone-acetaminophen (NORCO/VICODIN) 5-325 MG per tablet 1 tablet    Disp #12 tabs   HYDROcodone-acetaminophen (NORCO) 10-325 MG tablet    Sig: Take 1 tablet by mouth every 6 (six) hours as needed for up to 3 days for severe pain.    Dispense:  12 tablet    Refill:  0    Wellington Hampshire, MD

## 2021-07-02 NOTE — Patient Instructions (Signed)
May have been gout.   Don't take tramadol with the hydrocodone.  Meds sent to pharmacy

## 2021-07-04 DIAGNOSIS — G4733 Obstructive sleep apnea (adult) (pediatric): Secondary | ICD-10-CM | POA: Diagnosis not present

## 2021-08-04 ENCOUNTER — Telehealth: Payer: Self-pay | Admitting: Family Medicine

## 2021-08-04 DIAGNOSIS — G4733 Obstructive sleep apnea (adult) (pediatric): Secondary | ICD-10-CM | POA: Diagnosis not present

## 2021-08-04 NOTE — Telephone Encounter (Signed)
Pt's wife is needing to know if pt has received a Shingrix vaccine and the booster. Pt states he has but I did not see it in his chart or I've overlooked it.

## 2021-08-04 NOTE — Telephone Encounter (Signed)
Called and spoke with pt wife and made aware pt is UTD on shingrix.

## 2021-08-12 ENCOUNTER — Telehealth: Payer: Self-pay | Admitting: Adult Health

## 2021-08-12 DIAGNOSIS — J4550 Severe persistent asthma, uncomplicated: Secondary | ICD-10-CM

## 2021-08-12 NOTE — Telephone Encounter (Signed)
I called the wife and let her know that the new order for the nebulizer has been sent and we will get it faxed to [facility].

## 2021-08-13 ENCOUNTER — Other Ambulatory Visit: Payer: Self-pay

## 2021-08-13 ENCOUNTER — Ambulatory Visit (INDEPENDENT_AMBULATORY_CARE_PROVIDER_SITE_OTHER): Payer: Medicare Other | Admitting: Family Medicine

## 2021-08-13 ENCOUNTER — Encounter: Payer: Self-pay | Admitting: Family Medicine

## 2021-08-13 ENCOUNTER — Ambulatory Visit: Payer: Medicare Other | Admitting: Family Medicine

## 2021-08-13 VITALS — BP 140/70 | HR 94 | Temp 98.9°F | Ht 71.0 in | Wt 247.1 lb

## 2021-08-13 DIAGNOSIS — I5030 Unspecified diastolic (congestive) heart failure: Secondary | ICD-10-CM | POA: Diagnosis not present

## 2021-08-13 DIAGNOSIS — T148XXA Other injury of unspecified body region, initial encounter: Secondary | ICD-10-CM | POA: Diagnosis not present

## 2021-08-13 DIAGNOSIS — N183 Chronic kidney disease, stage 3 unspecified: Secondary | ICD-10-CM | POA: Diagnosis not present

## 2021-08-13 DIAGNOSIS — J8410 Pulmonary fibrosis, unspecified: Secondary | ICD-10-CM

## 2021-08-13 DIAGNOSIS — R062 Wheezing: Secondary | ICD-10-CM | POA: Diagnosis not present

## 2021-08-13 DIAGNOSIS — G894 Chronic pain syndrome: Secondary | ICD-10-CM

## 2021-08-13 MED ORDER — TRAMADOL HCL 50 MG PO TABS: 50.0000 mg | ORAL_TABLET | Freq: Four times a day (QID) | ORAL | 1 refills | Status: DC | PRN

## 2021-08-13 NOTE — Progress Notes (Signed)
Subjective:     Patient ID: Wayne Booth, male    DOB: 1940/10/21, 81 y.o.   MRN: [id number]  Chief Complaint  Patient presents with   Foot Pain    Bilateral foot pain that started a while ago, bothersome more at night Has not taking anything for pain    HPI-here w/wife Feet "killing me" for months-d/t neuropathy-getting worse.  Can't sleep at night d/t pain.  Pain "All the time" esp when puts feet up.  Not sure why has neuropathy,  has had NCS. But no MRI back.has done ABI.  Burning and aching down in toes. Tramadol helps him but taking bid.    Just got new neb this am.  Not know how to use it-I showed and he did Tx-chronic fatigue d/t lung dz  Health Maintenance Due  Topic Date Due   COLONOSCOPY (Pts 45-66yrs Insurance coverage will need to be confirmed)  05/27/2020   COVID-19 Vaccine (4 - Booster for [place] series) 08/04/2020   TETANUS/TDAP  06/08/2021    Past Medical History:  Diagnosis Date   Allergy    States his nose runs when he is around grease.    Anxiety    Arthritis    Atrial fibrillation (HCC)    Cancer (HCC)    Coronary artery disease    History of total knee replacement    Hypertension    Hypothyroidism    Kidney cancer, primary, with metastasis from kidney to other site [facility])    Kidney stone    OSA (obstructive sleep apnea)    pt does not wear cpap at night   Pituitary cyst ([place])    Pneumonia    hx of   Prostate cancer [facility])     Past Surgical History:  Procedure Laterality Date   ABLATION OF DYSRHYTHMIC FOCUS  01/28/2018   ANTERIOR CERVICAL DECOMP/DISCECTOMY FUSION N/A 08/29/2012   Procedure: ANTERIOR CERVICAL DECOMPRESSION/DISCECTOMY FUSION 1 LEVEL;  Surgeon: Eustace Moore, MD;  Location: [place] NEUR[facility] ORS;  Service: Neurosurgery;  Laterality: N/A;   APPENDECTOMY     ATRIAL FIBRILLATION ABLATION N/A 01/28/2018   Procedure: ATRIAL FIBRILLATION ABLATION;  Surgeon: Constance Haw, MD;  Location: [place] CV LAB;  Service: Cardiovascular;   Laterality: N/A;   CARDIAC CATHETERIZATION  2008   clean   CHOLECYSTECTOMY     COLONOSCOPY W/ POLYPECTOMY     CRANIOTOMY N/A 11/08/2012   Procedure: CRANIOTOMY HYPOPHYSECTOMY TRANSNASAL APPROACH;  Surgeon: Faythe Ghee, MD;  Location: [place] NEUR[facility] ORS;  Service: Neurosurgery;  Laterality: N/A;  Transphenoidal Resection of Pituitary Tumor   FINGER SURGERY Left 2017   Dr Amedeo Plenty   INSERTION PROSTATE RADIATION SEED     IR THORACENTESIS ASP PLEURAL SPACE W/IMG GUIDE  04/15/2018   KIDNEY STONE SURGERY     KNEE ARTHROSCOPY  2007   left   NEPHRECTOMY Left    POSTERIOR CERVICAL LAMINECTOMY Left 04/24/2015   Procedure: Foraminotomy cervical five - cervical six cervical six - cervical seven left;  Surgeon: Eustace Moore, MD;  Location: [facility];  Service: Neurosurgery;  Laterality: Left;   REPLACEMENT TOTAL KNEE BILATERAL     RIGHT/LEFT HEART CATH AND CORONARY ANGIOGRAPHY N/A 07/14/2019   Procedure: RIGHT/LEFT HEART CATH AND CORONARY ANGIOGRAPHY;  Surgeon: Jettie Booze, MD;  Location: [place] CV LAB;  Service: Cardiovascular;  Laterality: N/A;    Outpatient Medications Prior to Visit  Medication Sig Dispense Refill   albuterol (PROVENTIL) (2.5 MG/3ML) 0.083% nebulizer solution Take 3  mLs (2.5 mg total) by nebulization every 6 (six) hours as needed for wheezing or shortness of breath. 75 mL 3   albuterol (VENTOLIN HFA) 108 (90 Base) MCG/ACT inhaler Inhale 2 puffs into the lungs every 6 (six) hours as needed for wheezing or shortness of breath. 18 g 2   benzonatate (TESSALON) 200 MG capsule Take 1 capsule (200 mg total) by mouth 3 (three) times daily as needed for cough. 30 capsule 1   Budeson-Glycopyrrol-Formoterol (BREZTRI AEROSPHERE) 160-9-4.8 MCG/ACT AERO Inhale 2 puffs into the lungs in the morning and at bedtime. 10.7 g 6   Budeson-Glycopyrrol-Formoterol (BREZTRI AEROSPHERE) 160-9-4.8 MCG/ACT AERO Inhale 2 puffs into the lungs in the morning and at bedtime. 5.9 g 0   busPIRone  (BUSPAR) 5 MG tablet TAKE ONE TABLET BY MOUTH TWICE A DAY AS NEEDED FOR ANXIETY 180 tablet 0   ELIQUIS 5 MG TABS tablet TAKE ONE TABLET BY MOUTH TWICE A DAY 60 tablet 10   erythromycin ophthalmic ointment One-half inch (1.25 cm) four times daily for 5 to 7 days to affected eye(s) 3.5 g 0   escitalopram (LEXAPRO) 20 MG tablet TAKE ONE TABLET BY MOUTH DAILY 30 tablet 5   furosemide (LASIX) 40 MG tablet TAKE 1-2 TABLETS BY MOUTH  TWO TIMES A DAY 120 tablet 4   gabapentin (NEURONTIN) 400 MG capsule Take 1 capsule (400 mg total) by mouth 3 (three) times daily. 90 capsule 4   KLOR-CON M20 20 MEQ tablet TAKE ONE TABLET BY MOUTH TWICE A DAY 60 tablet 2   levothyroxine (SYNTHROID) 150 MCG tablet TAKE ONE TABLET BY MOUTH DAILY 90 tablet 0   Multiple Vitamins-Minerals (MULTIVITAMINS THER. W/MINERALS) TABS Take 1 tablet by mouth daily.     nortriptyline (PAMELOR) 50 MG capsule Take 1 capsule (50 mg total) by mouth at bedtime. 90 capsule 3   propranolol (INDERAL) 10 MG tablet TAKE ONE TABLET BY MOUTH TWICE A DAY 120 tablet 0   traZODone (DESYREL) 50 MG tablet TAKE 1/2 TO 1 TABLET BY MOUTH AT BEDTIME AS NEEDED FOR SLEEP 90 tablet 2   traMADol (ULTRAM) 50 MG tablet TAKE 1 TABLET BY MOUTH EVERY 12 HOURS AS NEEDED FOR MODERATE PAIN 30 tablet 2   No facility-administered medications prior to visit.    Allergies  Allergen Reactions   Ambien [Zolpidem] Anxiety    Anxiety and agitation when tried as sleeper in hospital aug 2019   DSK:[id number] except as noted in HPI      Objective:     BP 140/70    Pulse 94    Temp 98.9 F (37.2 C) (Temporal)    Ht 5\' 11"  (1.803 m)    Wt 247 lb 2 oz (112.1 kg)    SpO2 93%    BMI 34.47 kg/m  Wt Readings from Last 3 Encounters:  08/13/21 247 lb 2 oz (112.1 kg)  07/02/21 246 lb 6 oz (111.8 kg)  05/16/21 239 lb 6.4 oz (108.6 kg)        Gen: WDWN NAD OWM HEENT: NCAT, conjunctiva not injected, sclera nonicteric NECK:  supple, no thyromegaly, no nodes,  no carotid bruits CARDIAC: hard to hear as doing nebulizer. . DP 2+B LUNGS: distant.  Doing neb tx EXT:  no edema MSK: no gross abnormalities.  NEURO: A&O x3.  CN II-XII intact.  PSYCH: normal mood. Good eye contact.  Tremor hands  Reviewed ABI's  Assessment & Plan:   Problem List Items Addressed This Visit  Respiratory   Pulmonary fibrosis, 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HCC)     Other   Chronic pain syndrome - Primary (Chronic)   Relevant Medications   traMADol (ULTRAM) 50 MG tablet   Chronic pain-feet.  On gabapentin, pamelor.  On tramadol bid.  Can't take NSAID's(CKD and Eliquis).  Inc tramadol to q 6h prn-aware of risks.  Tylneol.  F/u Dr. Debria Garret fibrosis-got new nebulizer-showed wife how to assemble and use(pt did tx in office)  Meds ordered this encounter  Medications   traMADol (ULTRAM) 50 MG tablet    Sig: Take 1 tablet (50 mg total) by mouth every 6 (six) hours as needed.    Dispense:  90 tablet    Refill:  1    Wellington Hampshire, MD

## 2021-08-13 NOTE — Patient Instructions (Signed)
Try tramadol every 6 hrs as needed.  Let Dr. Yong Channel know how doing

## 2021-09-01 DIAGNOSIS — G4733 Obstructive sleep apnea (adult) (pediatric): Secondary | ICD-10-CM | POA: Diagnosis not present

## 2021-09-24 ENCOUNTER — Other Ambulatory Visit: Payer: Self-pay | Admitting: Family Medicine

## 2021-10-01 ENCOUNTER — Encounter: Payer: Self-pay | Admitting: Family Medicine

## 2021-10-01 ENCOUNTER — Ambulatory Visit (INDEPENDENT_AMBULATORY_CARE_PROVIDER_SITE_OTHER): Payer: Medicare Other | Admitting: Family Medicine

## 2021-10-01 VITALS — BP 126/62 | HR 76 | Temp 98.2°F | Ht 71.0 in | Wt 247.8 lb

## 2021-10-01 DIAGNOSIS — M25531 Pain in right wrist: Secondary | ICD-10-CM

## 2021-10-01 DIAGNOSIS — R768 Other specified abnormal immunological findings in serum: Secondary | ICD-10-CM | POA: Diagnosis not present

## 2021-10-01 DIAGNOSIS — E890 Postprocedural hypothyroidism: Secondary | ICD-10-CM

## 2021-10-01 DIAGNOSIS — M25532 Pain in left wrist: Secondary | ICD-10-CM

## 2021-10-01 DIAGNOSIS — I4891 Unspecified atrial fibrillation: Secondary | ICD-10-CM

## 2021-10-01 DIAGNOSIS — I10 Essential (primary) hypertension: Secondary | ICD-10-CM | POA: Diagnosis not present

## 2021-10-01 DIAGNOSIS — R739 Hyperglycemia, unspecified: Secondary | ICD-10-CM

## 2021-10-01 LAB — CBC WITH DIFFERENTIAL/PLATELET
Basophils Absolute: 0 10*3/uL (ref 0.0–0.1)
Basophils Relative: 0.5 % (ref 0.0–3.0)
Eosinophils Absolute: 0.2 10*3/uL (ref 0.0–0.7)
Eosinophils Relative: 3 % (ref 0.0–5.0)
HCT: 47.5 % (ref 39.0–52.0)
Hemoglobin: 15.7 g/dL (ref 13.0–17.0)
Lymphocytes Relative: 15.8 % (ref 12.0–46.0)
Lymphs Abs: 1.2 10*3/uL (ref 0.7–4.0)
MCHC: 33.1 g/dL (ref 30.0–36.0)
MCV: 92.1 fl (ref 78.0–100.0)
Monocytes Absolute: 0.7 10*3/uL (ref 0.1–1.0)
Monocytes Relative: 9.4 % (ref 3.0–12.0)
Neutro Abs: 5.6 10*3/uL (ref 1.4–7.7)
Neutrophils Relative %: 71.3 % (ref 43.0–77.0)
Platelets: 341 10*3/uL (ref 150.0–400.0)
RBC: 5.15 Mil/uL (ref 4.22–5.81)
RDW: 14.8 % (ref 11.5–15.5)
WBC: 7.9 10*3/uL (ref 4.0–10.5)

## 2021-10-01 LAB — T4, FREE: Free T4: 0.8 ng/dL (ref 0.60–1.60)

## 2021-10-01 LAB — COMPREHENSIVE METABOLIC PANEL
ALT: 13 U/L (ref 0–53)
AST: 19 U/L (ref 0–37)
Albumin: 3.8 g/dL (ref 3.5–5.2)
Alkaline Phosphatase: 70 U/L (ref 39–117)
BUN: 16 mg/dL (ref 6–23)
CO2: 28 mEq/L (ref 19–32)
Calcium: 9.7 mg/dL (ref 8.4–10.5)
Chloride: 102 mEq/L (ref 96–112)
Creatinine, Ser: 1.08 mg/dL (ref 0.40–1.50)
GFR: 65.38 mL/min (ref >60.00)
Glucose, Bld: 92 mg/dL (ref 70–99)
Potassium: 5.8 mEq/L — ABNORMAL HIGH (ref 3.5–5.1)
Sodium: 140 mEq/L (ref 135–145)
Total Bilirubin: 0.7 mg/dL (ref 0.2–1.2)
Total Protein: 7.9 g/dL (ref 6.0–8.3)

## 2021-10-01 LAB — T3, FREE: T3, Free: 2.9 pg/mL (ref 2.3–4.2)

## 2021-10-01 LAB — HEMOGLOBIN A1C: Hgb A1c MFr Bld: 5.6 % (ref 4.6–6.5)

## 2021-10-01 MED ORDER — DICLOFENAC SODIUM 1 % EX GEL
2.0000 g | Freq: Four times a day (QID) | CUTANEOUS | 1 refills | Status: DC
Start: 2021-10-01 — End: 2022-04-13

## 2021-10-01 NOTE — Patient Instructions (Addendum)
Health Maintenance Due  ?Topic Date Due  ? COLONOSCOPY - declines for now- agrees to reconsider next visit 05/27/2020  ? ?strongly suspect arthritis (osteoarthritis vs. Rheumatoid)- tramadol not helping. We are going to try voltaren gel 4x a day for up to 2 weeks to see if we can calm this back down- if not improving we can refer to sports medicine or orthopedics to consider x-rays and even injections.   ? ?We will call you within two weeks about your referral to rheumatology. If you do not hear within 2 weeks, give [place] a call.   ? ?Please stop by lab before you go ? ?TEAM MAKE SURE THEY COMPLETE LABS FROM November IN AS FUTURE ?If you have mychart- we will send your results within 3 business days of [place] receiving them.  ?If you do not have mychart- we will call you about results within 5 business days of [place] receiving them.  ?*please also note that you will see labs on mychart as soon as they post. I will later go in and write notes on them- will say "notes from Dr. Yong Channel"  ? ?Recommended follow up: No follow-ups on file. ?

## 2021-10-01 NOTE — Progress Notes (Signed)
?Phone [phone] ?In person visit ?  ?Subjective:  ? ?Wayne Booth is a 81 y.o. year old very pleasant male patient who presents for/with See problem oriented charting ?Chief Complaint  ?Patient presents with  ? Arthritis  ?  Pt c/o arthritis in bilateral hands.  ? ?This visit occurred during the SARS-CoV-2 public health emergency.  Safety protocols were in place, including screening questions prior to the visit, additional usage of staff PPE, and extensive cleaning of exam room while observing appropriate contact time as indicated for disinfecting solutions.  ? ?Past Medical History-  ?Patient Active Problem List  ? Diagnosis Date Noted  ? Pulmonary fibrosis, 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[place]) 10/13/2019  ?  Priority: High  ? Pneumonia due to COVID-19 virus 04/20/2019  ?  Priority: High  ? Memory loss 06/10/2018  ?  Priority: High  ? Chronic respiratory failure with hypoxia ([place]) 04/26/2018  ?  Priority: High  ? CAD (coronary artery disease) 04/09/2018  ?  Priority: High  ? CKD (chronic kidney disease) stage 3, GFR 30-59 ml/min (HCC)   ?  Priority: High  ? (HFpEF) heart failure with preserved ejection fraction ([place]) 12/16/2017  ?  Priority: High  ? Chest pain   ?  Priority: High  ? Atrial flutter ([place]) 11/01/2017  ?  Priority: High  ? Chronic pulmonary embolism ([place]) 10/23/2016  ?  Priority: High  ? Asthmatic bronchitis 06/09/2011  ?  Priority: High  ? History of prostate cancer-seed implantation 2010.  Dr. Alinda Money 10/02/2008  ?  Priority: High  ? History of renal cell carcinoma 12/26/2007  ?  Priority: High  ? PTSD (post-traumatic stress disorder) 07/27/2018  ?  Priority: Medium   ? HCAP (healthcare-associated pneumonia) 02/03/2018  ?  Priority: Medium   ? History of adenomatous polyp of colon 04/12/2017  ?  Priority: Medium   ? DDD (degenerative disc disease), cervical 11/13/2015  ?  Priority: Medium   ? Essential tremor 08/14/2015  ?  Priority: Medium   ? Gout 08/14/2015  ?  Priority: Medium   ? Hypothyroidism  07/12/2014  ?  Priority: Medium   ? Hyperglycemia 07/12/2014  ?  Priority: Medium   ? GAD (generalized anxiety disorder) 11/07/2013  ?  Priority: Medium   ? OSA (obstructive sleep apnea) 07/07/2011  ?  Priority: Medium   ? Morbid obesity ([place]) 10/02/2008  ?  Priority: Medium   ? Insomnia 12/26/2007  ?  Priority: Medium   ? Essential hypertension 12/28/2006  ?  Priority: Medium   ? Aortic atherosclerosis ([place]) 05/10/2018  ?  Priority: Low  ? First degree AV block 04/15/2018  ?  Priority: Low  ? Constipation 01/21/2018  ?  Priority: Low  ? Anticoagulated   ?  Priority: Low  ? Pain in left knee 07/15/2017  ?  Priority: Low  ? Bilateral foot pain 07/13/2017  ?  Priority: Low  ? Community acquired pneumonia 06/18/2017  ?  Priority: Low  ? Status post nephrectomy 06/09/2017  ?  Priority: Low  ? VTE (venous thromboembolism) 06/09/2017  ?  Priority: Low  ? Actinic keratosis 11/27/2009  ?  Priority: Low  ? Osteoarthritis 11/13/2008  ?  Priority: Low  ? TESTOSTERONE DEFICIENCY 12/28/2006  ?  Priority: Low  ? Pharmacologic therapy 01/26/2021  ? Disorder of skeletal system 01/26/2021  ? Problems influencing health status 01/26/2021  ? Pulmonary nodule 10/13/2019  ? Hyponatremia 04/15/2018  ? Pleural effusion 04/14/2018  ? Chronic pain syndrome 04/09/2018  ? Dyspnea   ?  History of fusion of cervical spine 04/24/2015  ? History of total knee replacement, right 12/17/2008  ? History of total knee replacement, left 10/28/2006  ? ? ?Medications- reviewed and updated ?Current Outpatient Medications  ?Medication Sig Dispense Refill  ? albuterol (PROVENTIL) (2.5 MG/3ML) 0.083% nebulizer solution Take 3 mLs (2.5 mg total) by nebulization every 6 (six) hours as needed for wheezing or shortness of breath. 75 mL 3  ? albuterol (VENTOLIN HFA) 108 (90 Base) MCG/ACT inhaler Inhale 2 puffs into the lungs every 6 (six) hours as needed for wheezing or shortness of breath. 18 g 2  ? Budeson-Glycopyrrol-Formoterol (BREZTRI AEROSPHERE) 160-9-4.8  MCG/ACT AERO Inhale 2 puffs into the lungs in the morning and at bedtime. 10.7 g 6  ? Budeson-Glycopyrrol-Formoterol (BREZTRI AEROSPHERE) 160-9-4.8 MCG/ACT AERO Inhale 2 puffs into the lungs in the morning and at bedtime. 5.9 g 0  ? busPIRone (BUSPAR) 5 MG tablet TAKE ONE TABLET BY MOUTH TWICE A DAY AS NEEDED FOR ANXIETY 180 tablet 0  ? diclofenac Sodium (VOLTAREN) 1 % GEL Apply 2 g topically 4 (four) times daily. 350 g 1  ? ELIQUIS 5 MG TABS tablet TAKE ONE TABLET BY MOUTH TWICE A DAY 60 tablet 10  ? erythromycin ophthalmic ointment One-half inch (1.25 cm) four times daily for 5 to 7 days to affected eye(s) 3.5 g 0  ? escitalopram (LEXAPRO) 20 MG tablet TAKE ONE TABLET BY MOUTH DAILY 30 tablet 5  ? furosemide (LASIX) 40 MG tablet TAKE 1-2 TABLETS BY MOUTH  TWO TIMES A DAY 120 tablet 4  ? gabapentin (NEURONTIN) 400 MG capsule Take 1 capsule (400 mg total) by mouth 3 (three) times daily. 90 capsule 4  ? KLOR-CON M20 20 MEQ tablet TAKE ONE TABLET BY MOUTH TWICE A DAY 60 tablet 2  ? levothyroxine (SYNTHROID) 150 MCG tablet TAKE ONE TABLET BY MOUTH DAILY 90 tablet 0  ? Multiple Vitamins-Minerals (MULTIVITAMINS THER. W/MINERALS) TABS Take 1 tablet by mouth daily.    ? nortriptyline (PAMELOR) 50 MG capsule Take 1 capsule (50 mg total) by mouth at bedtime. 90 capsule 3  ? propranolol (INDERAL) 10 MG tablet TAKE ONE TABLET BY MOUTH TWICE A DAY 120 tablet 0  ? traMADol (ULTRAM) 50 MG tablet Take 1 tablet (50 mg total) by mouth every 6 (six) hours as needed. 90 tablet 1  ? traZODone (DESYREL) 50 MG tablet TAKE 1/2 TO 1 TABLET BY MOUTH AT BEDTIME AS NEEDED FOR SLEEP 90 tablet 2  ? ?No current facility-administered medications for this visit.  ? ?  ?Objective:  ?BP 126/62   Pulse 76   Temp 98.2 ?F (36.8 ?C)   Ht '5\' 11"'$  (1.803 m)   Wt 247 lb 12.8 oz (112.4 kg)   SpO2 92%   BMI 34.56 kg/m?  ?Gen: NAD, resting comfortably ?CV: regular rate no murmurs rubs or gallops ?Lungs: faint crackles at lung bases otherwise CTAB ?Ext:  no edema ?Skin: warm, dry ?  ? ?Assessment and Plan  ? ? ?# Pain in right thumb and into the wrist.  ?S: pain up to 8-9/10 at times. No numbness/tingling. Can get similar in left hand and wrist but not as severe. Has not treid medication specifically for this- tramadol taken for other reasons/pain has not been helpful.  ?-does have some morning stiffness but not prolonged. Doesn't note joint swelling ? A/P: strongly suspect arthritis possibly OA but also has positive rheumatoid factor and ANA so RA possible- tramadol not helping. We are going to  try voltaren gel 4x a day for up to 2 weeks  ?-regardless I want him to see rheumatology given elevated rheumatoid factor- referred again today- appeared plan from pulmonology in past wasw to refer to rheumatology ? ?# Atrial fibrillation ?#Chronic PE- history PE following hand surgery  In past ?S: Rate controlled with  propranolol  10 mg twice daily ?Anticoagulated with  eliquis 5 mg BID ?A/P: appropriately anticoagulated and rate controlled for a fib (stable). Appopriately anticoagulated with chronic PE history (stable)  ? ?# Hyperglycemia/insulin resistance/prediabetes-peak A1c 6.3 recently but later improved after weight loss ?S:  Medication:  metformin '500mg'$  daily in past- now off ?Exercise and diet-  prior weight loss likely from illness and not necessarily from patient effort ?Lab Results  ?Component Value Date  ? HGBA1C 5.6 11/05/2020  ? A/P:  last a1c has improved- update a1c with labs today- was controlled last check and is off metformin at this time ? ?# postoperative  Hypothyroidism- cannot use TSH ?S: compliant On thyroid medication-Levothyroxine '150Mg'$  ?A/P: Hopefully stable problem-update free T3 and free T4-continue current medication for now ? ?#hypertension ?S: medication: Propranolol '10mg'$  BID, Lasix 40 mg twice daily for the most part ?BP Readings from Last 3 Encounters:  ?10/01/21 126/62  ?08/13/21 140/70  ?07/02/21 124/70  ?A/P: Controlled. Continue  current medications.  ? ?Recommended follow up: Return in about 4 months (around 01/31/2022) for physical or sooner if needed.Schedule b4 you leave.  Did not specifically discussed with patient-reason for doctor visit-[place]

## 2021-10-02 DIAGNOSIS — G4733 Obstructive sleep apnea (adult) (pediatric): Secondary | ICD-10-CM | POA: Diagnosis not present

## 2021-10-06 ENCOUNTER — Other Ambulatory Visit: Payer: Self-pay

## 2021-10-06 DIAGNOSIS — E875 Hyperkalemia: Secondary | ICD-10-CM

## 2021-10-08 ENCOUNTER — Telehealth: Payer: Self-pay | Admitting: Family Medicine

## 2021-10-08 NOTE — Telephone Encounter (Signed)
Pt is returning a call to [place] regarding his labs. Asking for a return call. ?

## 2021-10-08 NOTE — Telephone Encounter (Signed)
Called and spoke with pt spouse regarding labs.  ?

## 2021-10-16 ENCOUNTER — Other Ambulatory Visit: Payer: Medicare Other

## 2021-10-31 ENCOUNTER — Telehealth: Payer: Self-pay | Admitting: Family Medicine

## 2021-10-31 NOTE — Telephone Encounter (Signed)
Copied from [place] [phone]. Topic: Medicare AWV ?>> Oct 31, 2021 10:07 AM Harris-Coley, Hannah Beat wrote: ?Reason for CRM: Left message for patient to schedule Annual Wellness Visit.  Please schedule with Nurse Health Advisor Charlott Rakes, RN at [facility].  Please call [phone] ask for Juliann Pulse ?

## 2021-11-01 DIAGNOSIS — G4733 Obstructive sleep apnea (adult) (pediatric): Secondary | ICD-10-CM | POA: Diagnosis not present

## 2021-12-02 DIAGNOSIS — G4733 Obstructive sleep apnea (adult) (pediatric): Secondary | ICD-10-CM | POA: Diagnosis not present

## 2021-12-03 ENCOUNTER — Other Ambulatory Visit: Payer: Self-pay | Admitting: *Deleted

## 2021-12-03 NOTE — Telephone Encounter (Signed)
Refill request for Tramadol HCL 50 mg tablet  Last filled: 10/02/2021, 90,1

## 2021-12-04 MED ORDER — TRAMADOL HCL 50 MG PO TABS: 50.0000 mg | ORAL_TABLET | Freq: Four times a day (QID) | ORAL | 0 refills | Status: DC | PRN

## 2021-12-18 ENCOUNTER — Telehealth: Payer: Self-pay | Admitting: Family Medicine

## 2021-12-18 NOTE — Telephone Encounter (Signed)
Patient called in stating he was experiencing right side pain that he described as a throbbing sensation. He went on to state it had been occurring the whole night and has caused shortness of breath. Due to symptoms I have sent patient to triage for evaluation.

## 2021-12-18 NOTE — Telephone Encounter (Signed)
Patient Name: Wayne Booth Gender: Male DOB: 1940-11-12 Age: 81 Y 39 M 27 D Return Phone Number: [phone] (Primary), [phone] (Secondary) Address: City/ State/ Zip: [place]  [address] Client [facility] [place] Client Site [place] [facility] [place] [facility] Provider [name]- MD Contact Type Call Who Is Calling Patient / Member / Family / Caregiver Call Type Triage / Clinical Relationship To Patient Self Return Phone Number [phone] (Secondary) Chief Complaint BREATHING - shortness of breath or sounds breathless Reason for Call Symptomatic / Request for [place] states they have throbbing pain on right side, shortness of breath Translation No Nurse Assessment Nurse: Fredderick Phenix, RN, Lelan Pons Date/Time (Eastern Time): 12/18/2021 8:31:29 AM Confirm and document reason for call. If symptomatic, describe symptoms. ---Caller states they have throbbing pain on right ribs, coughing a lot (not having a cold), and shortness of breath. Started a few days ago, and getting worse. 02 93% Does the patient have any new or worsening symptoms? ---Yes Will a triage be completed? ---Yes Related visit to physician within the last 2 weeks? ---No Does the PT have any chronic conditions? (i.e. diabetes, asthma, this includes High risk factors for pregnancy, etc.) ---Yes List chronic conditions. ---HTN Is this a behavioral health or substance abuse call? ---No Guidelines Guideline Title Affirmed Question Affirmed Notes Nurse Date/Time (Eastern Time) Breathing Difficulty SEVERE difficulty breathing (e.g., struggling for each breath, speaks in single words) Fredderick Phenix, RN, Lelan Pons 12/18/2021 8:34:13 AM PLEASE NOTE: All timestamps contained within this report are represented as [place] Standard Time. CONFIDENTIALTY NOTICE: This fax transmission is intended only for the addressee. It contains information  that is legally privileged, confidential or otherwise protected from use or disclosure. If you are not the intended recipient, you are strictly prohibited from reviewing, disclosing, copying using or disseminating any of this information or taking any action in reliance on or regarding this information. If you have received this fax in error, please notify us immediately by telephone so that we can arrange for its return to [place]. Phone: [phone], Toll-Free: [phone], Fax: [phone] Page: 2 of 2 Call Id: [id number] [place]. Time Eilene Ghazi Time) Disposition Final User 12/18/2021 8:29:37 AM Send to Urgent Joana Reamer 12/18/2021 8:38:19 AM 911 Outcome Documentation Fredderick Phenix, RN, Lelan Pons Reason: Declines EMS transport, wife will drive him to ER 3/38/2505 8:36:31 AM Call EMS 911 Now Yes Fredderick Phenix, RN, [place] Disagree/Comply Comply Caller Understands Yes PreDisposition Did not know what to do Care Advice Given Per Guideline CALL EMS 911 NOW: * Immediate medical attention is needed. You need to hang up and call 911 (or an ambulance). CARE ADVICE given per Breathing Difficulty (Adult) guideline. Comments User: Almira Bar, RN Date/Time Eilene Ghazi Time): 12/18/2021 8:34:54 AM On 5 L 02 Referrals GO TO FACILITY UNDECIDED

## 2021-12-18 NOTE — Telephone Encounter (Signed)
Noted, awaiting triage notes.

## 2021-12-26 ENCOUNTER — Other Ambulatory Visit: Payer: Self-pay | Admitting: Family Medicine

## 2022-01-01 DIAGNOSIS — G4733 Obstructive sleep apnea (adult) (pediatric): Secondary | ICD-10-CM | POA: Diagnosis not present

## 2022-01-30 ENCOUNTER — Ambulatory Visit (INDEPENDENT_AMBULATORY_CARE_PROVIDER_SITE_OTHER): Payer: Medicare Other | Admitting: Family Medicine

## 2022-01-30 ENCOUNTER — Encounter: Payer: Self-pay | Admitting: Family Medicine

## 2022-01-30 VITALS — BP 130/78 | HR 98 | Temp 98.8°F | Ht 71.0 in | Wt 238.0 lb

## 2022-01-30 DIAGNOSIS — M79671 Pain in right foot: Secondary | ICD-10-CM | POA: Diagnosis not present

## 2022-01-30 DIAGNOSIS — R21 Rash and other nonspecific skin eruption: Secondary | ICD-10-CM | POA: Diagnosis not present

## 2022-01-30 DIAGNOSIS — M79672 Pain in left foot: Secondary | ICD-10-CM

## 2022-01-30 DIAGNOSIS — I10 Essential (primary) hypertension: Secondary | ICD-10-CM

## 2022-01-30 MED ORDER — KETOCONAZOLE 2 % EX CREA: 1.0000 | TOPICAL_CREAM | Freq: Two times a day (BID) | CUTANEOUS | 0 refills | Status: DC

## 2022-01-30 MED ORDER — TRAMADOL HCL 50 MG PO TABS: 50.0000 mg | ORAL_TABLET | Freq: Four times a day (QID) | ORAL | 3 refills | Status: DC | PRN

## 2022-01-30 MED ORDER — TRIAMCINOLONE ACETONIDE 0.5 % EX CREA
1.0000 | TOPICAL_CREAM | Freq: Two times a day (BID) | CUTANEOUS | 0 refills | Status: DC
Start: 2022-01-30 — End: 2022-04-13

## 2022-01-30 NOTE — Patient Instructions (Signed)
It was very nice to see you today!  Please try the triamcinolone and ketoconazole cream.  Let us know if not improving in the next couple of weeks.  I will send in with tramadol.  Please try using Sarna to help with the itching.  Take care, Dr Jerline Pain  PLEASE NOTE:  If you had any lab tests please let us know if you have not heard back within a few days. You may see your results on mychart before we have a chance to review them but we will give you a call once they are reviewed by [place]. If we ordered any referrals today, please let us know if you have not heard from their office within the next week.   Please try these tips to maintain a healthy lifestyle:  Eat at least 3 REAL meals and 1-2 snacks per day.  Aim for no more than 5 hours between eating.  If you eat breakfast, please do so within one hour of getting up.   Each meal should contain half fruits/vegetables, one quarter protein, and one quarter carbs (no bigger than a computer mouse)  Cut down on sweet beverages. This includes juice, soda, and sweet tea.   Drink at least 1 glass of water with each meal and aim for at least 8 glasses per day  Exercise at least 150 minutes every week.

## 2022-01-30 NOTE — Progress Notes (Addendum)
   Wayne Booth is a 81 y.o. male who presents today for an office visit.  Assessment/Plan:  New/Acute Problems: Rash In a location typically consistent with tinea infection however he does have some overlying scale-may be psoriasis.  Difficult to determine due to the amount of excoriation present.  We will start treatment with both ketoconazole and triamcinolone.  He will let me know if not improving in the next 1 to 2 weeks.  Would consider biopsy at that time.  We discussed referral to dermatology however he deferred.  Chronic Problems Addressed Today: Neuropathic Pain / Foot Pain No red flags.  Pain is not controlled.  Advised patient we would not be able to add on any medications at this point.  He is already on nortriptyline and gabapentin.  Advised him to follow-up with his PCP soon.  We reviewed database without any red flags.  We will refill his tramadol today.  Ongoing refills should come from his PCP.  Hypertension At goal on propranolol 10 mg twice daily.    Subjective:  HPI:  Patient here with rash.  Located on buttocks. Started for several weeks. Itchy with some pain. Tried OTC creams which did not work.        Objective:  Physical Exam: BP 130/78   Pulse 98   Temp 98.8 F (37.1 C) (Temporal)   Ht '5\' 11"'$  (1.803 m)   Wt 238 lb (108 kg)   SpO2 (!) 89%   BMI 33.19 kg/m   Gen: No acute distress, resting comfortably Skin: Excoriated approximately 3 cm rash on left gluteal cleft with overlying silvery scale. Neuro: Grossly normal, moves all extremities Psych: Normal affect and thought content      [name] M. Jerline Pain, MD 01/30/2022 1:25 PM

## 2022-02-01 DIAGNOSIS — G4733 Obstructive sleep apnea (adult) (pediatric): Secondary | ICD-10-CM | POA: Diagnosis not present

## 2022-02-04 ENCOUNTER — Encounter (INDEPENDENT_AMBULATORY_CARE_PROVIDER_SITE_OTHER): Payer: Self-pay

## 2022-02-09 ENCOUNTER — Ambulatory Visit: Payer: Medicare Other | Admitting: Family Medicine

## 2022-02-25 ENCOUNTER — Other Ambulatory Visit: Payer: Self-pay

## 2022-02-25 NOTE — Patient Instructions (Signed)
Visit Information  Thank you for taking time to visit with me today. Please don't hesitate to contact me if I can be of assistance to you.   Following are the goals we discussed today:   Goals Addressed             This Visit's Progress    Pt would like to breath better       Care Coordination Interventions: Advised patient to call to schedule follow up with provider and Annual Wellness Visit Provided education to patient re: pulmonary fibrosis, Annual Wellness Visit, care coordination services Assessed social determinant of health barriers Scheduled RNCM call on 03/06/22          RNCM next appointment is by telephone on 03/06/22 at 1 PM  Please call the care guide team at [phone] if you need to cancel or reschedule your appointment.   If you are experiencing a Mental Health or [place] or need someone to talk to, please call the Suicide and Crisis Lifeline: 988 call the [place] National Suicide Prevention Lifeline: 312-807-4111 or TTY: (508)517-4933 TTY 514-670-9254) to talk to a trained counselor call 1-800-273-TALK (toll free, 24 hour hotline) go to [facility] [address] [place] [phone]) call 911   Patient verbalizes understanding of instructions and care plan provided today and agrees to view in [place]. Active MyChart status and patient understanding of how to access instructions and care plan via MyChart confirmed with patient.     Telephone follow up appointment with care management team member scheduled for: 03/06/22 at 1 PM  [place], [facility], [place] Management [phone]

## 2022-02-25 NOTE — Patient Outreach (Signed)
  Care Coordination   Initial Visit Note   02/25/2022 Name: Wayne Booth MRN: [id number] DOB: 23-May-1941  Wayne Booth is a 81 y.o. year old male who sees Yong Channel, Brayton Mars, MD for primary care. I  spoke with DPR wife Wayne Booth  What matters to the patients health and wellness today?  Pt is having difficulty dealing with his breathing and pulmonary fibrosis    Goals Addressed             This Visit's Progress    Pt would like to breath better       Care Coordination Interventions: Advised patient to call to schedule follow up with provider and Annual Wellness Visit Provided education to patient re: pulmonary fibrosis, Annual Wellness Visit, care coordination services Assessed social determinant of health barriers Scheduled RNCM call on 03/06/22          SDOH assessments and interventions completed:  Yes  SDOH Interventions Today    Flowsheet Row Most Recent Value  SDOH Interventions   Food Insecurity Interventions Intervention Not Indicated  Housing Interventions Intervention Not Indicated  Transportation Interventions Intervention Not Indicated        Care Coordination Interventions Activated:  Yes  Care Coordination Interventions:  Yes, provided   Follow up plan: Follow up call scheduled for 03/06/22 with [place] RN    Encounter Outcome:  Pt. Visit Completed  Peter Garter RN, [facility], CDE Care Management Coordinator [facility] Management [phone]

## 2022-03-04 DIAGNOSIS — G4733 Obstructive sleep apnea (adult) (pediatric): Secondary | ICD-10-CM | POA: Diagnosis not present

## 2022-03-06 ENCOUNTER — Ambulatory Visit: Payer: Self-pay | Admitting: *Deleted

## 2022-03-06 NOTE — Patient Outreach (Signed)
  Care Coordination   03/06/2022 Name: Wayne Booth MRN: [id number] DOB: [birth year]   Care Coordination Outreach Attempts:  An unsuccessful telephone outreach was attempted for a scheduled appointment today.  Follow Up Plan:  Additional outreach attempts will be made to offer the patient care coordination information and services.   Encounter Outcome:  No Answer  Care Coordination Interventions Activated:  No   Care Coordination Interventions:  No, not indicated    Raina Mina, RN Care Management Coordinator [place] Office [phone]

## 2022-03-13 ENCOUNTER — Telehealth: Payer: Self-pay

## 2022-03-13 NOTE — Chronic Care Management (AMB) (Signed)
  Care Coordination Note  03/13/2022 Name: Wayne Booth MRN: [id number] DOB: 20-Oct-1940  Wayne Booth is a 81 y.o. year old male who is a primary care patient of Marin Olp, MD and is actively engaged with the care management team. I reached out to [name] by phone today to assist with re-scheduling an initial visit with the RN Case Manager  Follow up plan: Patient declines further follow up and engagement by the care management team. Appropriate care team members and provider have been notified via electronic communication.   Noreene Larsson, [place], [place] [address] Direct Dial: [phone] [email or web address]

## 2022-03-13 NOTE — Chronic Care Management (AMB) (Signed)
  Care Coordination Note  03/13/2022 Name: Wayne Booth MRN: [id number] DOB: 1940/11/05  Wayne Booth is a 81 y.o. year old male who is a primary care patient of Marin Olp, MD and is actively engaged with the care management team. I reached out to [name] by phone today to assist with re-scheduling an initial visit with the RN Case Manager  Follow up plan: Unsuccessful telephone outreach attempt made. A HIPAA compliant phone message was left for the patient providing contact information and requesting a return call.  The care management team will reach out to the patient again over the next 7 days.  If patient returns call to provider office, please advise to call [facility]  at [place], [place], [place] [address] Direct Dial: [phone] [email or web address]

## 2022-03-19 ENCOUNTER — Ambulatory Visit: Payer: Medicare Other | Admitting: Adult Health

## 2022-03-23 ENCOUNTER — Ambulatory Visit: Payer: Medicare Other

## 2022-03-23 ENCOUNTER — Encounter: Payer: Self-pay | Admitting: *Deleted

## 2022-03-26 ENCOUNTER — Other Ambulatory Visit: Payer: Self-pay | Admitting: Family Medicine

## 2022-04-03 DIAGNOSIS — G4733 Obstructive sleep apnea (adult) (pediatric): Secondary | ICD-10-CM | POA: Diagnosis not present

## 2022-04-13 ENCOUNTER — Ambulatory Visit (INDEPENDENT_AMBULATORY_CARE_PROVIDER_SITE_OTHER): Payer: Medicare Other | Admitting: Family Medicine

## 2022-04-13 ENCOUNTER — Encounter: Payer: Self-pay | Admitting: Family Medicine

## 2022-04-13 VITALS — BP 118/60 | HR 88 | Temp 98.2°F | Ht 71.0 in | Wt 232.6 lb

## 2022-04-13 DIAGNOSIS — E890 Postprocedural hypothyroidism: Secondary | ICD-10-CM | POA: Diagnosis not present

## 2022-04-13 DIAGNOSIS — I5032 Chronic diastolic (congestive) heart failure: Secondary | ICD-10-CM | POA: Diagnosis not present

## 2022-04-13 DIAGNOSIS — Z23 Encounter for immunization: Secondary | ICD-10-CM | POA: Diagnosis not present

## 2022-04-13 DIAGNOSIS — R739 Hyperglycemia, unspecified: Secondary | ICD-10-CM

## 2022-04-13 DIAGNOSIS — I2782 Chronic pulmonary embolism: Secondary | ICD-10-CM | POA: Diagnosis not present

## 2022-04-13 DIAGNOSIS — L89323 Pressure ulcer of left buttock, stage 3: Secondary | ICD-10-CM | POA: Diagnosis not present

## 2022-04-13 DIAGNOSIS — J9611 Chronic respiratory failure with hypoxia: Secondary | ICD-10-CM

## 2022-04-13 DIAGNOSIS — I10 Essential (primary) hypertension: Secondary | ICD-10-CM

## 2022-04-13 MED ORDER — ESCITALOPRAM OXALATE 20 MG PO TABS: 20.0000 mg | ORAL_TABLET | Freq: Every day | ORAL | 3 refills | Status: DC

## 2022-04-13 MED ORDER — "POLYMEM NON-ADHESIVE 4""X4"" PADS": MEDICATED_PAD | 0 refills | Status: DC

## 2022-04-13 MED ORDER — APIXABAN 5 MG PO TABS: 5.0000 mg | ORAL_TABLET | Freq: Two times a day (BID) | ORAL | 10 refills | Status: DC

## 2022-04-13 MED ORDER — BREZTRI AEROSPHERE 160-9-4.8 MCG/ACT IN AERO: 2.0000 | INHALATION_SPRAY | Freq: Two times a day (BID) | RESPIRATORY_TRACT | 11 refills | Status: DC

## 2022-04-13 MED ORDER — FUROSEMIDE 40 MG PO TABS: 40.0000 mg | ORAL_TABLET | Freq: Every day | ORAL | 4 refills | Status: DC | PRN

## 2022-04-13 MED ORDER — GABAPENTIN 400 MG PO CAPS: 400.0000 mg | ORAL_CAPSULE | Freq: Three times a day (TID) | ORAL | 4 refills | Status: DC

## 2022-04-13 MED ORDER — BELSOMRA 5 MG PO TABS: 5.0000 mg | ORAL_TABLET | Freq: Every evening | ORAL | 5 refills | Status: DC | PRN

## 2022-04-13 MED ORDER — BUSPIRONE HCL 5 MG PO TABS: ORAL_TABLET | ORAL | 1 refills | Status: DC

## 2022-04-13 NOTE — Patient Instructions (Addendum)
Will refer to wound care- seems like pressure ulcer- if no improvement with them and polymem pads- we also have wound care referral pending (let us know if no improvement within 2 weeks) depending on progress may need dermatology opinion but I think this is a pressure ulcer  Try vaseline or cerave or eucerin for dry skin  Frequent position changes needed- every 2 hours minimum to offload pressure on the buttocks- consider wedge pillow at night to help  Please stop by lab before you go If you have mychart- we will send your results within 3 business days of [place] receiving them.  If you do not have mychart- we will call you about results within 5 business days of [place] receiving them.  *please also note that you will see labs on mychart as soon as they post. I will later go in and write notes on them- will say "notes from Dr. Yong Channel"   Recommended follow up: Return in about 1 month (around 05/14/2022) for followup or sooner if needed.Schedule b4 you leave.

## 2022-04-13 NOTE — Progress Notes (Signed)
Phone [phone] In person visit   Subjective:   Wayne Booth is a 81 y.o. year old very pleasant male patient who presents for/with See problem oriented charting Chief Complaint  Patient presents with   bedsore    Pt c/o bedsore that has been present x2 weeks that's not getting better.   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[place]) 10/13/2019    Priority: High   Pneumonia due to COVID-19 virus 04/20/2019    Priority: High   Memory loss 06/10/2018    Priority: High   Chronic respiratory failure with hypoxia ([place]) 04/26/2018    Priority: High   CAD (coronary artery disease) 04/09/2018    Priority: High   CKD (chronic kidney disease) stage 3, GFR 30-59 ml/min (HCC)     Priority: High   (HFpEF) heart failure with preserved ejection fraction ([place]) 12/16/2017    Priority: High   Chest pain     Priority: High   Atrial flutter ([place]) 11/01/2017    Priority: High   Chronic pulmonary embolism ([place]) 10/23/2016    Priority: High   Asthmatic bronchitis 06/09/2011    Priority: High   History of prostate cancer-seed implantation 2010.  Dr. Alinda Money 10/02/2008    Priority: High   History of renal cell carcinoma 12/26/2007    Priority: High   PTSD (post-traumatic stress disorder) 07/27/2018    Priority: Medium    HCAP (healthcare-associated pneumonia) 02/03/2018    Priority: Medium    History of adenomatous polyp of colon 04/12/2017    Priority: Medium    DDD (degenerative disc disease), cervical 11/13/2015    Priority: Medium    Essential tremor 08/14/2015    Priority: Medium    Gout 08/14/2015    Priority: Medium    Hypothyroidism 07/12/2014    Priority: Medium    Hyperglycemia 07/12/2014    Priority: Medium    GAD (generalized anxiety disorder) 11/07/2013    Priority: Medium    OSA (obstructive sleep apnea) 07/07/2011    Priority: Medium    Morbid obesity ([place]) 10/02/2008    Priority: Medium     Insomnia 12/26/2007    Priority: Medium    Essential hypertension 12/28/2006    Priority: Medium    Aortic atherosclerosis ([place]) 05/10/2018    Priority: Low   First degree AV block 04/15/2018    Priority: Low   Constipation 01/21/2018    Priority: Low   Anticoagulated     Priority: Low   Pain in left knee 07/15/2017    Priority: Low   Bilateral foot pain 07/13/2017    Priority: [place] acquired pneumonia 06/18/2017    Priority: Low   Status post nephrectomy 06/09/2017    Priority: Low   VTE (venous thromboembolism) 06/09/2017    Priority: Low   Actinic keratosis 11/27/2009    Priority: Low   Osteoarthritis 11/13/2008    Priority: Low   TESTOSTERONE DEFICIENCY 12/28/2006    Priority: Low   Pharmacologic therapy 01/26/2021   Disorder of skeletal system 01/26/2021   Problems influencing health status 01/26/2021   Pulmonary nodule 10/13/2019   Hyponatremia 04/15/2018   Pleural effusion 04/14/2018   Chronic pain syndrome 04/09/2018   Dyspnea    History of fusion of cervical spine 04/24/2015   History of total knee replacement, right 12/17/2008   History of total knee replacement, left 10/28/2006    Medications- reviewed and updated Current Outpatient Medications  Medication Sig Dispense Refill   albuterol (PROVENTIL) (2.5 MG/3ML) 0.083% nebulizer solution Take 3 mLs (2.5 mg total) by nebulization every 6 (six) hours as needed for wheezing or shortness of breath. 75 mL 3   albuterol (VENTOLIN HFA) 108 (90 Base) MCG/ACT inhaler Inhale 2 puffs into the lungs every 6 (six) hours as needed for wheezing or shortness of breath. 18 g 2   Gauze Pads & Dressings (POLYMEM NON-ADHESIVE) 4"X4" PADS Change dressing daily 15 each 0   Multiple Vitamins-Minerals (MULTIVITAMINS THER. W/MINERALS) TABS Take 1 tablet by mouth daily.     Suvorexant (BELSOMRA) 5 MG TABS Take 5 mg by mouth at bedtime as needed (for sleep- NO screens 30 minutes before bed). 30 tablet 5   traMADol  (ULTRAM) 50 MG tablet Take 1 tablet (50 mg total) by mouth every 6 (six) hours as needed (prefer max 3 per day). 90 tablet 3   apixaban (ELIQUIS) 5 MG TABS tablet Take 1 tablet (5 mg total) by mouth 2 (two) times daily. 60 tablet 10   Budeson-Glycopyrrol-Formoterol (BREZTRI AEROSPHERE) 160-9-4.8 MCG/ACT AERO Inhale 2 puffs into the lungs in the morning and at bedtime. 10.7 g 11   busPIRone (BUSPAR) 5 MG tablet TAKE ONE TABLET BY MOUTH TWICE A DAY AS NEEDED FOR ANXIETY 180 tablet 1   escitalopram (LEXAPRO) 20 MG tablet Take 1 tablet (20 mg total) by mouth daily. 90 tablet 3   furosemide (LASIX) 40 MG tablet Take 1 tablet (40 mg total) by mouth daily as needed for fluid or edema (or weight gain). 120 tablet 4   gabapentin (NEURONTIN) 400 MG capsule Take 1 capsule (400 mg total) by mouth 3 (three) times daily. 90 capsule 4   No current facility-administered medications for this visit.     Objective:  BP 118/60   Pulse 88   Temp 98.2 F (36.8 C)   Ht '5\' 11"'$  (1.803 m)   Wt 232 lb 9.6 oz (105.5 kg)   SpO2 94%   BMI 32.44 kg/m  Gen: NAD, resting comfortably CV: Irregularly irregular Lungs: Crackles to midlung fields bilaterally Ext: no edema Skin: warm, dry, 3 x 3 cm lesion with some exposure of on left lower buttocks-Per prior reports about 3 x 3 cm but with excoriation and silvery scale    Assessment and Plan   #Social update-for son Coralyn Pear thankfully no cancer on brain MRI  # Pressure ulcer S: sore noted 2-3 weeks ago per patient but wife points out seen 01/30/22 by Dr. Jerline Pain- was thought to be possible tinea vs psoriasis- treated with ketoconazole and triamcinolone and no improvement was noted.  A/P: Appears to have a stage III pressure ulcer-recommended PolyMem pads and frequent position changes as well as referral to wound care-this was placed today -Also discussed possibility that this is not a pressure ulcer and potential need for dermatology visit and biopsy -See after visit  summary  #central hypothyroidism S: compliant On thyroid medication-levothyroxine 150 mcg previously prescribed- stopped taking a few months ago  Lab Results  Component Value Date   TSH 4.91 (H) 11/05/2020   A/P:has not seen [facility] in some time and has stopped medication- will updated T3 and T4 today-strongly suspect he will need to restart medication-interestingly enough has not had weight gain  # Hyperglycemia/insulin resistance/prediabetes S:  Medication: None Lab Results  Component Value Date   HGBA1C 5.6 10/01/2021   HGBA1C 5.6 11/05/2020   HGBA1C 6.3 07/31/2019   A/P: Elevated A1c in the past but  has had weight loss-update A1c with labs  #Pulmonary fibrosis with chronic respiratory failure S:controller: breztri using as needed in past but ran out. Does need nebulizers   -on oxygen at night and with activity in the day-started after COVID in 2020 -Does not feel like breathing is well since running out of controller inhaler A/P: Poor control-recommended pulmonary follow-up and refilled Breztri  #Heart failure with preserved ejection fraction S: Medication: Not currently on any medication-previously furosemide '40mg'$  BID and Potassium twice daily  Edema: none Weight gain: actually down- not eating well Shortness of breath:  no increase A/P: CHF-appears euvolemic despite not taking furosemide-we opted to change this to as needed   # Atrial fibrillation #Chronic PE- history PE following hand surgery  In past S: Rate controlled with no rx- OFF propranolol Anticoagulated with no rx- he decided to stop-eliquis 5 mg BID A/P: With how sedentary patient is at risk for recurrence of PE-restart Eliquis-also discussed stroke risk off of Eliquis - Would be okay with him stopping Eliquis short-term if needed skin biopsy  #Neuropathy- feet #Cervical disc disease S: Patient with significant neuropathy in bilateral feet.  He is on gabapentin 400 mg 3 times a day through podiatry or  primary care-he prefers this over neurology visit - He took himself off nortriptyline since last visit on Lyrica in the past -Have referred to pain management in the past to help with cervical disc issues as well as neuropathy but this ended up being in [place] and not possible for him -He also had tramadol available for pain which I recommended to use sparingly due to risk of serotonin syndrome with other medications.  Fortunately some of these risks are lower as he stopped nortriptyline and no longer on Flexeril A/P: Neuropathy stable-continue primarily gabapentin and tramadol  # Depression # Generalized anxiety disorder  S: Medication:Lexapro '20Mg'$  - NOW OFF- nortriptyline '50mg'$  down from 75 mg- for cervical disc disease primarily -Buspirone '5Mg'$  once or twice a week still -also on trazodone for sleep- not working [place] but screentime before bed  A/P: Depression reasonably controlled-patient denies issue but wife states medication has been very helpful.  Continue Lexapro and buspirone.  Trial Belsomra and avoid screens before bed-trazodone has not been effective  Recommended follow up: Return in about 1 month (around 05/14/2022) for followup or sooner if needed.Schedule b4 you leave. Future Appointments  Date Time Provider [place]  07/16/2022 10:20 AM Marin Olp, MD [facility]   Lab/Order associations:   ICD-10-CM   1. 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 Essential hypertension  I10 CBC with Differential/Platelet    Comprehensive metabolic panel    Lipid panel    3. Hyperglycemia  R73.9 HgB A1c    4. Pressure injury of left buttock, stage 3 (HCC)  L89.323 AMB referral to wound care center    5. Other chronic pulmonary embolism without acute cor pulmonale (HCC) Chronic I27.82     6. Chronic heart failure with preserved ejection fraction (HCC) Chronic I50.32     7. Chronic respiratory failure with hypoxia (HCC) Chronic J96.11     8.  Need for immunization against influenza  Z23 Flu Vaccine QUAD High Dose(Fluad)      Meds ordered this encounter  Medications   Budeson-Glycopyrrol-Formoterol (BREZTRI AEROSPHERE) 160-9-4.8 MCG/ACT AERO    Sig: Inhale 2 puffs into the lungs in the morning and at bedtime.    Dispense:  10.7 g  Refill:  11    Order Specific Question:   Lot Number?    Answer:   [id number]    Order Specific Question:   Expiration Date?    Answer:   12/27/2023    Order Specific Question:   Manufacturer?    Answer:   [employer] [[id number]]    Order Specific Question:   Quantity    Answer:   1   busPIRone (BUSPAR) 5 MG tablet    Sig: TAKE ONE TABLET BY MOUTH TWICE A DAY AS NEEDED FOR ANXIETY    Dispense:  180 tablet    Refill:  1   apixaban (ELIQUIS) 5 MG TABS tablet    Sig: Take 1 tablet (5 mg total) by mouth 2 (two) times daily.    Dispense:  60 tablet    Refill:  10   escitalopram (LEXAPRO) 20 MG tablet    Sig: Take 1 tablet (20 mg total) by mouth daily.    Dispense:  90 tablet    Refill:  3   furosemide (LASIX) 40 MG tablet    Sig: Take 1 tablet (40 mg total) by mouth daily as needed for fluid or edema (or weight gain).    Dispense:  120 tablet    Refill:  4   gabapentin (NEURONTIN) 400 MG capsule    Sig: Take 1 capsule (400 mg total) by mouth 3 (three) times daily.    Dispense:  90 capsule    Refill:  4   Suvorexant (BELSOMRA) 5 MG TABS    Sig: Take 5 mg by mouth at bedtime as needed (for sleep- NO screens 30 minutes before bed).    Dispense:  30 tablet    Refill:  5   Gauze Pads & Dressings (POLYMEM NON-ADHESIVE) 4"X4" PADS    Sig: Change dressing daily    Dispense:  15 each    Refill:  0    Time Spent: 42 minutes of total time (2:53 PM-3:28 PM, 9:15 PM-9:22 PM) was spent on the date of the encounter performing the following actions: chart review prior to seeing the patient, obtaining history, performing a medically necessary exam, counseling on the treatment plan as well as importance of  medication adherence as well as importance of frequent turns, placing orders, and documenting in our EHR.    Return precautions advised.  [name], MD

## 2022-04-14 LAB — COMPREHENSIVE METABOLIC PANEL
ALT: 17 U/L (ref 0–53)
AST: 23 U/L (ref 0–37)
Albumin: 3.5 g/dL (ref 3.5–5.2)
Alkaline Phosphatase: 80 U/L (ref 39–117)
BUN: 13 mg/dL (ref 6–23)
CO2: 30 mEq/L (ref 19–32)
Calcium: 9.6 mg/dL (ref 8.4–10.5)
Chloride: 99 mEq/L (ref 96–112)
Creatinine, Ser: 1.12 mg/dL (ref 0.40–1.50)
GFR: 62.35 mL/min (ref >60.00)
Glucose, Bld: 85 mg/dL (ref 70–99)
Potassium: 4.6 mEq/L (ref 3.5–5.1)
Sodium: 134 mEq/L — ABNORMAL LOW (ref 135–145)
Total Bilirubin: 0.6 mg/dL (ref 0.2–1.2)
Total Protein: 8.5 g/dL — ABNORMAL HIGH (ref 6.0–8.3)

## 2022-04-14 LAB — CBC WITH DIFFERENTIAL/PLATELET
Basophils Absolute: 0.1 10*3/uL (ref 0.0–0.1)
Basophils Relative: 1.2 % (ref 0.0–3.0)
Eosinophils Absolute: 0.4 10*3/uL (ref 0.0–0.7)
Eosinophils Relative: 4 % (ref 0.0–5.0)
HCT: 46.1 % (ref 39.0–52.0)
Hemoglobin: 15.5 g/dL (ref 13.0–17.0)
Lymphocytes Relative: 15.7 % (ref 12.0–46.0)
Lymphs Abs: 1.4 10*3/uL (ref 0.7–4.0)
MCHC: 33.6 g/dL (ref 30.0–36.0)
MCV: 89.2 fl (ref 78.0–100.0)
Monocytes Absolute: 1 10*3/uL (ref 0.1–1.0)
Monocytes Relative: 10.6 % (ref 3.0–12.0)
Neutro Abs: 6.3 10*3/uL (ref 1.4–7.7)
Neutrophils Relative %: 68.5 % (ref 43.0–77.0)
Platelets: 407 10*3/uL — ABNORMAL HIGH (ref 150.0–400.0)
RBC: 5.16 Mil/uL (ref 4.22–5.81)
RDW: 14.7 % (ref 11.5–15.5)
WBC: 9.2 10*3/uL (ref 4.0–10.5)

## 2022-04-14 LAB — LIPID PANEL
Cholesterol: 117 mg/dL (ref 0–200)
HDL: 31.7 mg/dL — ABNORMAL LOW (ref >39.00)
LDL Cholesterol: 59 mg/dL (ref 0–99)
NonHDL: 84.83
Total CHOL/HDL Ratio: 4
Triglycerides: 127 mg/dL (ref 0.0–149.0)
VLDL: 25.4 mg/dL (ref 0.0–40.0)

## 2022-04-14 LAB — T4, FREE: Free T4: 0.63 ng/dL (ref 0.60–1.60)

## 2022-04-14 LAB — HEMOGLOBIN A1C: Hgb A1c MFr Bld: 5.7 % (ref 4.6–6.5)

## 2022-04-14 LAB — T3, FREE: T3, Free: 3.2 pg/mL (ref 2.3–4.2)

## 2022-04-16 ENCOUNTER — Telehealth: Payer: Self-pay | Admitting: Family Medicine

## 2022-04-16 NOTE — Telephone Encounter (Signed)
Called and spoke with pt wife and labs reviewed.

## 2022-04-16 NOTE — Telephone Encounter (Signed)
Patient called for an update on labs from 10/16. Patient requests a call from PCP or PCP team about lab results.

## 2022-04-23 ENCOUNTER — Encounter (HOSPITAL_BASED_OUTPATIENT_CLINIC_OR_DEPARTMENT_OTHER): Payer: Medicare Other | Attending: General Surgery | Admitting: General Surgery

## 2022-04-23 DIAGNOSIS — L97822 Non-pressure chronic ulcer of other part of left lower leg with fat layer exposed: Secondary | ICD-10-CM | POA: Insufficient documentation

## 2022-04-23 DIAGNOSIS — J9611 Chronic respiratory failure with hypoxia: Secondary | ICD-10-CM | POA: Insufficient documentation

## 2022-04-23 DIAGNOSIS — I130 Hypertensive heart and chronic kidney disease with heart failure and stage 1 through stage 4 chronic kidney disease, or unspecified chronic kidney disease: Secondary | ICD-10-CM | POA: Insufficient documentation

## 2022-04-23 DIAGNOSIS — E890 Postprocedural hypothyroidism: Secondary | ICD-10-CM | POA: Diagnosis not present

## 2022-04-23 DIAGNOSIS — N183 Chronic kidney disease, stage 3 unspecified: Secondary | ICD-10-CM | POA: Insufficient documentation

## 2022-04-23 DIAGNOSIS — L89322 Pressure ulcer of left buttock, stage 2: Secondary | ICD-10-CM | POA: Diagnosis not present

## 2022-04-23 DIAGNOSIS — J8410 Pulmonary fibrosis, unspecified: Secondary | ICD-10-CM | POA: Diagnosis not present

## 2022-04-23 DIAGNOSIS — L299 Pruritus, unspecified: Secondary | ICD-10-CM | POA: Insufficient documentation

## 2022-04-23 DIAGNOSIS — I5030 Unspecified diastolic (congestive) heart failure: Secondary | ICD-10-CM | POA: Insufficient documentation

## 2022-04-28 ENCOUNTER — Ambulatory Visit: Payer: Medicare Other

## 2022-04-28 NOTE — Progress Notes (Signed)
Barriere, Wayne Booth ([id number]) [id number] Page 1 of 8 Visit Report for 04/23/2022 Allergy List Details Patient Name: Date of Service: MA Wayne Booth, [place] NA LD W. 04/23/2022 1:30 PM Medical Record Number: [id number] Patient Account Number: [id number] Date of Birth/Sex: Treating RN: [birth year] (81 y.o. Waldron Session Primary Care Biddie Sebek: [name] Other Clinician: Referring Canaan Holzer: Treating Dela Sweeny/Extender: Delman Kitten in Treatment: 0 Allergies Active Allergies No Known Allergies Allergy Notes Electronic Signature(s) Signed: 04/28/2022 4:16:20 PM By: Blanche East RN Entered By: Blanche East on 04/23/2022 13:54:06 -------------------------------------------------------------------------------- Arrival Information Details Patient Name: Date of Service: MA Wayne Booth, [place] NA LD W. 04/23/2022 1:30 PM Medical Record Number: [id number] Patient Account Number: [id number] Date of Birth/Sex: Treating RN: [birth year] (81 y.o. Waldron Session Primary Care Flossie Wexler: [name] Other Clinician: Referring Fredie Majano: Treating Fatma Rutten/Extender: Delman Kitten in Treatment: 0 Visit Information Patient Arrived: Ambulatory Arrival Time: 13:51 Accompanied By: wife Transfer Assistance: None Patient Identification Verified: Yes Secondary Verification Process Completed: Yes Patient Requires Transmission-Based Precautions: No Patient Has Alerts: Yes Patient Alerts: Patient on Blood Thinner Electronic Signature(s) Signed: 04/28/2022 4:16:20 PM By: Blanche East RN Entered By: Blanche East on 04/23/2022 14:53:51 -------------------------------------------------------------------------------- Clinic Level of Care Assessment Details Patient Name: Date of Service: MA Wayne Booth, [place] NA LD W. 04/23/2022 1:30 PM Erck, Wayne Booth ([id number]) [id number] Page 2 of 8 Medical Record Number:  [id number] Patient Account Number: [id number] Date of Birth/Sex: Treating RN: 07-Jan-1941 (81 y.o. Waldron Session Primary Care Lou Loewe: [name] Other Clinician: Referring Jaleiah Asay: Treating Kennley Schwandt/Extender: Delman Kitten in Treatment: 0 Clinic Level of Care Assessment Items TOOL 1 Quantity Score X- 1 0 Use when EandM and Procedure is performed on INITIAL visit ASSESSMENTS - Nursing Assessment / Reassessment X- 1 20 General Physical Exam (combine w/ comprehensive assessment (listed just below) when performed on new pt. evals) X- 1 25 Comprehensive Assessment (HX, ROS, Risk Assessments, Wounds Hx, etc.) ASSESSMENTS - Wound and Skin Assessment / Reassessment X- 1 10 Dermatologic / Skin Assessment (not related to wound area) ASSESSMENTS - Ostomy and/or Continence Assessment and Care '[]'$  - 0 Incontinence Assessment and Management '[]'$  - 0 Ostomy Care Assessment and Management (repouching, etc.) PROCESS - Coordination of Care X - Simple Patient / Family Education for ongoing care 1 15 '[]'$  - 0 Complex (extensive) Patient / Family Education for ongoing care X- 1 10 Staff obtains Programmer, systems, Records, T Results / Process Orders est X- 1 10 Staff telephones HHA, Nursing Homes / Clarify orders / etc '[]'$  - 0 Routine Transfer to another Facility (non-emergent condition) '[]'$  - 0 Routine Hospital Admission (non-emergent condition) '[]'$  - 0 New Admissions / Biomedical engineer / Ordering NPWT Apligraf, etc. , '[]'$  - 0 Emergency Hospital Admission (emergent condition) PROCESS - Special Needs '[]'$  - 0 Pediatric / Minor Patient Management '[]'$  - 0 Isolation Patient Management '[]'$  - 0 Hearing / Language / Visual special needs '[]'$  - 0 Assessment of Community assistance (transportation, D/C planning, etc.) '[]'$  - 0 Additional assistance / Altered mentation '[]'$  - 0 Support Surface(s) Assessment (bed, cushion, seat, etc.) INTERVENTIONS - Miscellaneous '[]'$  -  0 External ear exam '[]'$  - 0 Patient Transfer (multiple staff / Civil Service fast streamer / Similar devices) '[]'$  - 0 Simple Staple / Suture removal (25 or less) '[]'$  - 0 Complex Staple / Suture removal (26 or more) '[]'$  - 0 Hypo/Hyperglycemic Management (do not check if billed separately) '[]'$  - 0 Ankle / Brachial Index (ABI) - do not  check if billed separately Has the patient been seen at the hospital within the last three years: Yes Total Score: 90 Level Of Care: New/Established - Level 3 Electronic Signature(s) Signed: 04/28/2022 4:16:20 PM By: Blanche East RN Entered By: Blanche East on 04/23/2022 16:46:00 Sledd, Wayne Booth ([id number]) [id number] Page 3 of 8 -------------------------------------------------------------------------------- Encounter Discharge Information Details Patient Name: Date of Service: Wayne Booth, [place] NA LD W. 04/23/2022 1:30 PM Medical Record Number: [id number] Patient Account Number: [id number] Date of Birth/Sex: Treating RN: 01-10-1941 (81 y.o. Waldron Session Primary Care Laberta Wilbon: [name] Other Clinician: Referring Jaedin Regina: Treating Devynn Scheff/Extender: Delman Kitten in Treatment: 0 Encounter Discharge Information Items Post Procedure Vitals Discharge Condition: Stable Temperature (F): 97.9 Ambulatory Status: Ambulatory Pulse (bpm): 85 Discharge Destination: Home Respiratory Rate (breaths/min): 22 Transportation: Private Auto Blood Pressure (mmHg): 161/81 Accompanied By: wife Schedule Follow-up Appointment: Yes Clinical Summary of Care: Electronic Signature(s) Signed: 04/28/2022 4:16:20 PM By: Blanche East RN Entered By: Blanche East on 04/23/2022 14:54:58 -------------------------------------------------------------------------------- Lower Extremity Assessment Details Patient Name: Date of Service: MA Wayne Booth, [place] NA LD W. 04/23/2022 1:30 PM Medical Record Number: [id number] Patient Account Number:  [id number] Date of Birth/Sex: Treating RN: [birth year] (81 y.o. Waldron Session Primary Care Simone Tuckey: [name] Other Clinician: Referring Jamillah Camilo: Treating Nitza Schmid/Extender: Delman Kitten in Treatment: 0 Electronic Signature(s) Signed: 04/28/2022 4:16:20 PM By: Blanche East RN Entered By: Blanche East on 04/23/2022 14:05:30 -------------------------------------------------------------------------------- Multi Wound Chart Details Patient Name: Date of Service: MA Wayne Booth, RO NA LD W. 04/23/2022 1:30 PM Medical Record Number: [id number] Patient Account Number: [id number] Date of Birth/Sex: Treating RN: 01-06-1941 (81 y.o. M) Primary Care Jerald Hennington: [name] Other Clinician: Referring Cutter Passey: Treating Elesha Thedford/Extender: Delman Kitten in Treatment: 0 Vital Signs Height(in): 71 Pulse(bpm): 85 Weight(lbs): Blood Pressure(mmHg): 161/81 Body Mass Index(BMI): Temperature(F): 97.9 Respiratory Rate(breaths/min): [address] Jerron W ([id number]) [id number] Page 4 of 8 [1:Photos:] [N/A:N/A] Left Gluteus N/A N/A Wound Location: Laceration N/A N/A Wounding Event: Atypical N/A N/A Primary Etiology: Chronic Obstructive Pulmonary N/A N/A Comorbid History: Disease (COPD), Hypertension, Gout, Neuropathy, Confinement Anxiety 02/04/2022 N/A N/A Date Acquired: 0 N/A N/A Weeks of Treatment: Open N/A N/A Wound Status: No N/A N/A Wound Recurrence: 4x3x0.1 N/A N/A Measurements L x W x D (cm) 9.425 N/A N/A A (cm) : rea 0.942 N/A N/A Volume (cm) : Full Thickness Without Exposed N/A N/A Classification: Support Structures Medium N/A N/A Exudate A mount: Sanguinous N/A N/A Exudate Type: red N/A N/A Exudate Color: Medium (34-66%) N/A N/A Granulation A mount: Red, Pink N/A N/A Granulation Quality: Medium (34-66%) N/A N/A Necrotic A mount: Eschar N/A N/A Necrotic Tissue: Small (1-33%)  N/A N/A Epithelialization: Debridement - Selective/Open Wound N/A N/A Debridement: Pre-procedure Verification/Time Out 14:34 N/A N/A Taken: Necrotic/Eschar, Slough N/A N/A Tissue Debrided: Non-Viable Tissue N/A N/A Level: 1 N/A N/A Debridement A (sq cm): rea Curette N/A N/A Instrument: Minimum N/A N/A Bleeding: Pressure N/A N/A Hemostasis A chieved: 0 N/A N/A Procedural Pain: 0 N/A N/A Post Procedural Pain: Procedure was tolerated well N/A N/A Debridement Treatment Response: 4x3x0.1 N/A N/A Post Debridement Measurements L x W x D (cm) 0.942 N/A N/A Post Debridement Volume: (cm) Excoriation: Yes N/A N/A Periwound Skin Texture: Dry/Scaly: Yes N/A N/A Periwound Skin Moisture: Debridement N/A N/A Procedures Performed: Treatment Notes Wound #1 (Gluteus) Wound Laterality: Left Cleanser Peri-Wound Care Topical Triamcinolone Discharge Instruction: Apply Triamcinolone as directed Primary Dressing Secondary Dressing ALLEVYN Gentle Border, 4x4 (in/in) Discharge Instruction: Apply  over primary dressing as directed. Secured With [employer] Compression Stockings Add-Ons Herard, MCCABE GLORIA ([id number]) [id number] Page 5 of 8 Electronic Signature(s) Signed: 04/23/2022 3:02:41 PM By: Fredirick Maudlin MD FACS Entered By: Fredirick Maudlin on 04/23/2022 15:02:40 -------------------------------------------------------------------------------- Multi-Disciplinary Care Plan Details Patient Name: Date of Service: MA Wayne Booth, [place] NA LD W. 04/23/2022 1:30 PM Medical Record Number: [id number] Patient Account Number: [id number] Date of Birth/Sex: Treating RN: 18-Mar-1941 (81 y.o. Waldron Session Primary Care Sagal Gayton: [name] Other Clinician: Referring Marlen Mollica: Treating Sally-Ann Cutbirth/Extender: Delman Kitten in Treatment: 0 Active Inactive Nutrition Nursing Diagnoses: Potential for alteratiion in Nutrition/Potential for  imbalanced nutrition Goals: Patient/caregiver agrees to and verbalizes understanding of need to use nutritional supplements and/or vitamins as prescribed Date Initiated: 04/23/2022 Target Resolution Date: 05/28/2022 Goal Status: Active Interventions: Assess patient nutrition upon admission and as needed per policy Treatment Activities: Dietary management education, guidance and counseling : 04/23/2022 Notes: Orientation to the Wound Care Program Nursing Diagnoses: Knowledge deficit related to the wound healing center program Goals: Patient/caregiver will verbalize understanding of the [place] Program Date Initiated: 04/23/2022 Target Resolution Date: 05/01/2022 Goal Status: Active Interventions: Provide education on orientation to the wound center Notes: Wound/Skin Impairment Nursing Diagnoses: Impaired tissue integrity Goals: Ulcer/skin breakdown will have a volume reduction of 30% by week 4 Date Initiated: 04/23/2022 Target Resolution Date: 05/28/2022 Goal Status: Active Interventions: Assess ulceration(s) every visit Notes: Electronic Signature(s) Signed: 04/28/2022 4:16:20 PM By: Blanche East RN Entered By: Blanche East on 04/23/2022 14:37:44 Mendel, Wayne Booth ([id number]) [id number] Page 6 of 8 -------------------------------------------------------------------------------- Pain Assessment Details Patient Name: Date of Service: Wayne Booth, [place] NA LD W. 04/23/2022 1:30 PM Medical Record Number: [id number] Patient Account Number: [id number] Date of Birth/Sex: Treating RN: [birth year] (81 y.o. Waldron Session Primary Care Misty Rago: [name] Other Clinician: Referring Sumeet Geter: Treating Oasis Goehring/Extender: Delman Kitten in Treatment: 0 Active Problems Location of Pain Severity and Description of Pain Patient Has Paino No Site Locations Pain Management and Medication Current Pain  Management: Electronic Signature(s) Signed: 04/28/2022 4:16:20 PM By: Blanche East RN Entered By: Blanche East on 04/23/2022 14:18:26 -------------------------------------------------------------------------------- Patient/Caregiver Education Details Patient Name: Date of Service: MA Wayne Booth, [place] NA LD W. 10/26/2023andnbsp1:30 PM Medical Record Number: [id number] Patient Account Number: [id number] Date of Birth/Gender: Treating RN: Feb 01, 1941 (81 y.o. Waldron Session Primary Care Physician: [name] Other Clinician: Referring Physician: Treating Physician/Extender: Delman Kitten in Treatment: 0 Education Assessment Education Provided To: Patient Education Topics Provided Welcome T [facility]: o Handouts: [place] o Methods: Explain/Verbal Destin, VIAAN KNIPPENBERG ([id number]) [id number] Page 7 of 8 Responses: Reinforcements needed, State content correctly Wound/Skin Impairment: Handouts: Skin Care Do's and Dont's Methods: Explain/Verbal Responses: Reinforcements needed, State content correctly Electronic Signature(s) Signed: 04/28/2022 4:16:20 PM By: Blanche East RN Entered By: Blanche East on 04/23/2022 14:38:03 -------------------------------------------------------------------------------- Wound Assessment Details Patient Name: Date of Service: MA Wayne Booth, RO NA LD W. 04/23/2022 1:30 PM Medical Record Number: [id number] Patient Account Number: [id number] Date of Birth/Sex: Treating RN: Dec 13, 1940 (81 y.o. Waldron Session Primary Care Lucah Petta: [name] Other Clinician: Referring Mayte Diers: Treating Jaymarion Trombly/Extender: Delman Kitten in Treatment: 0 Wound Status Wound Number: 1 Primary Atypical Etiology: Wound Location: Left Gluteus Wound Open Wounding Event: Laceration Status: Date Acquired: 02/04/2022 Comorbid Chronic Obstructive Pulmonary Disease  (COPD), Hypertension, Weeks Of Treatment: 0 History: Gout, Neuropathy, Confinement Anxiety Clustered Wound: No Photos Wound Measurements Length: (  cm) 4 Width: (cm) 3 Depth: (cm) 0.1 Area: (cm) 9.425 Volume: (cm) 0.942 % Reduction in Area: % Reduction in Volume: Epithelialization: Small (1-33%) Tunneling: No Undermining: No Wound Description Classification: Full Thickness Without Exposed Support Exudate Amount: Medium Exudate Type: Sanguinous Exudate Color: red Structures Foul Odor After Cleansing: No Slough/Fibrino No Wound Bed Granulation Amount: Medium (34-66%) Granulation Quality: Red, Pink Necrotic Amount: Medium (34-66%) Necrotic Quality: Eschar Periwound Skin Texture Texture Color No Abnormalities Noted: No No Abnormalities Noted: No Kooy, NICANOR MENDOLIA ([id number]) [id number] Page 8 of 8 Excoriation: Yes Moisture No Abnormalities Noted: No Dry / Scaly: Yes Treatment Notes Wound #1 (Gluteus) Wound Laterality: Left Cleanser Peri-Wound Care Topical Triamcinolone Discharge Instruction: Apply Triamcinolone as directed Primary Dressing Secondary Dressing ALLEVYN Gentle Border, 4x4 (in/in) Discharge Instruction: Apply over primary dressing as directed. Secured With Compression Wrap Compression Stockings Environmental education officer) Signed: 04/28/2022 4:16:20 PM By: Blanche East RN Entered By: Blanche East on 04/23/2022 14:39:36 -------------------------------------------------------------------------------- Vitals Details Patient Name: Date of Service: MA Wayne Booth, [place] NA LD W. 04/23/2022 1:30 PM Medical Record Number: [id number] Patient Account Number: [id number] Date of Birth/Sex: Treating RN: Oct 05, 1940 (81 y.o. Waldron Session Primary Care Azya Barbero: [name] Other Clinician: Referring Milly Goggins: Treating Zacharias Ridling/Extender: Delman Kitten in Treatment: 0 Vital Signs Time Taken:  13:52 Temperature (F): 97.9 Height (in): 71 Pulse (bpm): 85 Source: Stated Respiratory Rate (breaths/min): 22 Unable to obtain height and weight: Patient Refused Blood Pressure (mmHg): 161/81 Reference Range: 80 - 120 mg / dl Electronic Signature(s) Signed: 04/28/2022 4:16:20 PM By: Blanche East RN Entered By: Blanche East on 04/23/2022 13:53:08

## 2022-04-28 NOTE — Progress Notes (Signed)
Springsteen, Wayne Booth ([id number]) [id number] Page 1 of 10 Visit Report for 04/23/2022 Chief Complaint Document Details Patient Name: Date of Service: MA Wayne Booth, [place] NA LD W. 04/23/2022 1:30 PM Medical Record Number: [id number] Patient Account Number: [id number] Date of Birth/Sex: Treating RN: 21-Jul-1940 (81 y.o. M) Primary Care Provider: [name] Other Clinician: Referring Provider: Treating Provider/Extender: Delman Kitten in Treatment: 0 Information Obtained from: Patient Chief Complaint Patient seen for complaints of Non-Healing Wound. Electronic Signature(s) Signed: 04/23/2022 3:04:23 PM By: Fredirick Maudlin MD FACS Entered By: Fredirick Maudlin on 04/23/2022 15:04:23 -------------------------------------------------------------------------------- Debridement Details Patient Name: Date of Service: MA Wayne Booth, RO NA LD W. 04/23/2022 1:30 PM Medical Record Number: [id number] Patient Account Number: [id number] Date of Birth/Sex: Treating RN: 1940-12-28 (81 y.o. Waldron Session Primary Care Provider: [name] Other Clinician: Referring Provider: Treating Provider/Extender: Delman Kitten in Treatment: 0 Debridement Performed for Assessment: Wound #1 Left Gluteus Performed By: Physician Fredirick Maudlin, MD Debridement Type: Debridement Level of Consciousness (Pre-procedure): Awake and Alert Pre-procedure Verification/Time Out Yes - 14:34 Taken: Start Time: 14:25 T Area Debrided (L x W): otal 1 (cm) x 1 (cm) = 1 (cm) Tissue and other material debrided: Non-Viable, Eschar, [employer] Level: Non-Viable Tissue Debridement Description: Selective/Open Wound Instrument: Curette Bleeding: Minimum Hemostasis Achieved: Pressure Procedural Pain: 0 Post Procedural Pain: 0 Response to Treatment: Procedure was tolerated well Level of Consciousness (Post- Awake and Alert procedure): Post  Debridement Measurements of Total Wound Length: (cm) 4 Stage: Category/Stage II Width: (cm) 3 Depth: (cm) 0.1 Volume: (cm) 0.942 Character of Wound/Ulcer Post Debridement: Requires Further Debridement Post Procedure Diagnosis Same as Pre-procedure Malecha, Wayne Booth ([id number]) [id number] Page 2 of 10 Notes Scribed for Dr. Celine Ahr by Blanche East, RN Electronic Signature(s) Signed: 04/24/2022 8:01:11 AM By: Fredirick Maudlin MD FACS Signed: 04/28/2022 4:16:20 PM By: Blanche East RN Entered By: Blanche East on 04/23/2022 14:28:00 -------------------------------------------------------------------------------- HPI Details Patient Name: Date of Service: MA Wayne Booth, RO NA LD W. 04/23/2022 1:30 PM Medical Record Number: [id number] Patient Account Number: [id number] Date of Birth/Sex: Treating RN: [birth year] (81 y.o. M) Primary Care Provider: [name] Other Clinician: Referring Provider: Treating Provider/Extender: Delman Kitten in Treatment: 0 History of Present Illness HPI Description: ADMISSION 04/23/2022 This is a 81 year old man with a past medical history significant for COPD, CHF, coronary artery disease, CKD stage III, pulmonary fibrosis, and postsurgical hypothyroidism. He was referred by his primary care provider to evaluate an ulcer on his left buttock. It was billed as a pressure ulcer. It has been present for about a month per the patient, but looking back in the electronic medical record, it was present on August 24 at a primary care visit. The patient is not diabetic. He does report significant pruritus and his wife states that he is constantly scratching at every part of his body. He has not been seen by dermatology for this. She also indicates that he is hygiene has fallen off somewhat secondary to difficulty getting into the shower and fear of falling. The wound in question is an area of excoriation on the  patient's left buttock, adjacent to the gluteal cleft. There is only a tiny area that actually has any skin breakdown. I do not see any evidence whatsoever of a pressure injury. He has evidence of scratching on all the visualized wound surfaces. He has little raised nodules on the extensor surfaces of both forearms. He says these are exceptionally itchy. No open wounds  in this location, however. Electronic Signature(s) Signed: 04/23/2022 3:07:47 PM By: Fredirick Maudlin MD FACS Entered By: Fredirick Maudlin on 04/23/2022 15:07:47 -------------------------------------------------------------------------------- Physical Exam Details Patient Name: Date of Service: MA Wayne Booth, RO NA LD W. 04/23/2022 1:30 PM Medical Record Number: [id number] Patient Account Number: [id number] Date of Birth/Sex: Treating RN: 1940/09/30 (81 y.o. M) Primary Care Provider: [name] Other Clinician: Referring Provider: Treating Provider/Extender: Delman Kitten in Treatment: 0 Constitutional Hypertensive, asymptomatic. . . . No acute distress. Respiratory Dyspneic with activity. He did not bring his oxygen with him.. Notes 04/23/2022: The wound in question is an area of excoriation on the patient's left buttock, adjacent to the gluteal cleft. There is only a tiny area that actually has any skin breakdown. I do not see any evidence whatsoever of a pressure injury. He has evidence of scratching on all the visualized wound surfaces. He has little raised nodules on the extensor surfaces of both forearms. He says these are exceptionally itchy. No open wounds in this location, however. Electronic Signature(s) Signed: 04/23/2022 3:09:47 PM By: Fredirick Maudlin MD FACS Llewellyn, Wayne Booth ([id number]) [id number] Page 3 of 10 Entered By: Fredirick Maudlin on 04/23/2022 15:09:47 -------------------------------------------------------------------------------- Physician  Orders Details Patient Name: Date of Service: MA Wayne Booth, [place] NA LD W. 04/23/2022 1:30 PM Medical Record Number: [id number] Patient Account Number: [id number] Date of Birth/Sex: Treating RN: 12-02-1940 (81 y.o. Waldron Session Primary Care Provider: [name] Other Clinician: Referring Provider: Treating Provider/Extender: Delman Kitten in Treatment: 0 Verbal / Phone Orders: No Diagnosis Coding ICD-10 Coding Code Description [phone] Non-pressure chronic ulcer of other part of left lower leg with fat layer exposed L29.9 Pruritus, unspecified I50.30 Unspecified diastolic (congestive) heart failure J96.11 Chronic respiratory failure with hypoxia J84.10 Pulmonary fibrosis, unspecified N18.30 Chronic kidney disease, stage 3 unspecified Follow-up Appointments ppointment in 1 week. - Dr. Celine Ahr Rm 4 Return A Friday 05/01/22 at 1:00PM Anesthetic (In clinic) Topical Lidocaine 5% applied to wound bed - in clinic, prior to debridement Bathing/ Shower/ Hygiene May shower and wash wound with soap and water. Other Bathing/Shower/Hygiene Orders/Instructions: - Apply Triamcinolone cream to affected areas Edema Control - Lymphedema / SCD / Other Avoid standing for long periods of time. Moisturize legs daily. Off-Loading Wound #1 Left Gluteus Turn and reposition every 2 hours Wound Treatment Wound #1 - Gluteus Wound Laterality: Left Topical: Triamcinolone Discharge Instructions: Apply Triamcinolone as directed Secondary Dressing: ALLEVYN Gentle Border, 4x4 (in/in) Discharge Instructions: Apply over primary dressing as directed. 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 Patient Medications llergies: No Known Allergies A Notifications Medication Indication Start End 04/23/2022 triamcinolone acetonide DOSE topical 0.1 % cream - Apply thin layer to all affected skin surfaces twice daily Electronic Signature(s) Smeltz, Wayne Booth ([id number])  [id number] Page 4 of 10 Signed: 04/23/2022 4:24:39 PM By: Fredirick Maudlin MD FACS Previous Signature: 04/23/2022 3:12:36 PM Version By: Fredirick Maudlin MD FACS Entered By: Fredirick Maudlin on 04/23/2022 16:24:39 Prescription 04/23/2022 -------------------------------------------------------------------------------- Paget, Simonne Come MD Patient Name: Provider: 02-08-1941 [id number] Date of Birth: NPI#: Jerilynn Mages [id number] Sex: DEA #: [phone] Phone #: License #: [place] Patient Address: [address] 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 04/23/2022 1:30 PM Medical Record Number: [id number] Patient Account Number: [id number] Date of Birth/Sex: Treating RN: [birth year] (81 y.o. M) Primary Care Provider: [name] Other Clinician: Referring Provider: Treating Provider/Extender: Delman Kitten in Treatment: 0 Active Problems ICD-10 Encounter Code Description Active Date MDM Diagnosis [phone] Non-pressure chronic ulcer of other part of left lower leg with fat layer 04/23/2022 No Yes exposed L29.9 Pruritus, unspecified 04/23/2022 No Yes I50.30 Unspecified diastolic (congestive) heart failure 04/23/2022 No Yes Mcelhinney, JAVELL BLACKBURN ([id number]) [id number] Page 5 of 10 J96.11 Chronic respiratory  failure with hypoxia 04/23/2022 No Yes J84.10 Pulmonary fibrosis, unspecified 04/23/2022 No Yes N18.30 Chronic kidney disease, stage 3 unspecified 04/23/2022 No Yes Inactive Problems Resolved Problems Electronic Signature(s) Signed: 04/23/2022 3:01:30 PM By: Fredirick Maudlin MD FACS Entered By: Fredirick Maudlin on 04/23/2022 15:01:30 -------------------------------------------------------------------------------- Progress Note Details Patient Name: Date of Service: MA Wayne Booth, [place] NA LD W. 04/23/2022 1:30 PM Medical Record Number: [id number] Patient Account Number: [id number] Date of Birth/Sex: Treating RN: 28-Nov-1940 (81 y.o. M) Primary Care Provider: [name] Other Clinician: Referring Provider: Treating Provider/Extender: Delman Kitten in Treatment: 0 Subjective Chief Complaint Information obtained from Patient Patient seen for complaints of Non-Healing Wound. History of Present Illness (HPI) ADMISSION 04/23/2022 This is a 81 year old man with a past medical history significant for COPD, CHF, coronary artery disease, CKD stage III, pulmonary fibrosis, and postsurgical hypothyroidism. He was referred by his primary care provider to evaluate an ulcer on his left buttock. It was billed as a pressure ulcer. It has been present for about a month per the patient, but looking back in the electronic medical record, it was present on August 24 at a primary care visit. The patient is not diabetic. He does report significant pruritus and his wife states that he is constantly scratching at every part of his body. He has not been seen by dermatology for this. She also indicates that he is hygiene has fallen off somewhat secondary to difficulty getting into the shower and fear of falling. The wound in question is an area of excoriation on the patient's left buttock, adjacent to the gluteal cleft. There is only a tiny area that actually has any skin breakdown. I do  not see any evidence whatsoever of a pressure injury. He has evidence of scratching on all the visualized wound surfaces. He has little raised nodules on the extensor surfaces of both forearms. He says these are exceptionally itchy. No open wounds in this location, however. Patient History Allergies No Known Allergies Family History Cancer - Mother,Siblings. Social History Never smoker, Marital Status - Married, Alcohol Use - Never, Drug Use - No History, Caffeine Use - Rarely. Medical History Eyes Denies history of Cataracts, Glaucoma, Optic Neuritis Respiratory Patient has history of Chronic Obstructive Pulmonary Disease (COPD) Cardiovascular Patient has history of Hypertension Gastrointestinal Denies history of Cirrhosis , Colitis, Crohnoos, Hepatitis A, Hepatitis B, Hepatitis C Endocrine Vajda, ROBEY MASSMANN ([id number]) [id number] Page 6 of 10 Denies history of Type I Diabetes, Type II Diabetes Genitourinary Denies history of End Stage Renal Disease Immunological Denies history of Lupus Erythematosus, Raynaudoos Integumentary (Skin) Denies history of History of Burn Musculoskeletal Patient has history of Gout Denies history of Rheumatoid Arthritis, Osteoarthritis, Osteomyelitis Neurologic Patient has history of Neuropathy Denies history of Seizure Disorder Psychiatric Patient has history of Confinement Anxiety  Medical A Surgical History Notes nd 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 Eyes Complains or has symptoms of Glasses / Contacts - Readers. Cardiovascular Denies complaints or symptoms of Chest pain. Gastrointestinal Denies complaints or symptoms of Frequent diarrhea, Nausea. Integumentary (Skin) Sacrum Psychiatric Denies complaints or symptoms of  Claustrophobia. Objective Constitutional Hypertensive, asymptomatic. No acute distress. Vitals Time Taken: 1:52 PM, Height: 71 in, Source: Stated, Temperature: 97.9 F, Pulse: 85 bpm, Respiratory Rate: 22 breaths/min, Blood Pressure: 161/81 mmHg. Respiratory Dyspneic with activity. He did not bring his oxygen with him.. General Notes: 04/23/2022: The wound in question is an area of excoriation on the patient's left buttock, adjacent to the gluteal cleft. There is only a tiny area that actually has any skin breakdown. I do not see any evidence whatsoever of a pressure injury. He has evidence of scratching on all the visualized wound surfaces. He has little raised nodules on the extensor surfaces of both forearms. He says these are exceptionally itchy. No open wounds in this location, however. Integumentary (Hair, Skin) Wound #1 status is Open. Original cause of wound was Laceration. The date acquired was: 02/04/2022. The wound is located on the Left Gluteus. The wound measures 4cm length x 3cm width x 0.1cm depth; 9.425cm^2 area and 0.942cm^3 volume. There is no tunneling or undermining noted. There is a medium amount of sanguinous drainage noted. There is medium (34-66%) red, pink granulation within the wound bed. There is a medium (34-66%) amount of necrotic tissue within the wound bed including Eschar. The periwound skin appearance exhibited: Excoriation, Dry/Scaly. Assessment Active Problems ICD-10 Non-pressure chronic ulcer of other part of left lower leg with fat layer exposed Pruritus, unspecified Unspecified diastolic (congestive) heart failure Chronic respiratory failure with hypoxia Pulmonary fibrosis, unspecified Chronic kidney disease, stage 3 unspecified Procedures Wound #1 Goodenow, Wayne Booth ([id number]) [id number] Page 7 of 10 Pre-procedure diagnosis of Wound #1 is a Pressure Ulcer located on the Left Gluteus . There was a Selective/Open Wound  Non-Viable Tissue Debridement with a total area of 1 sq cm performed by Fredirick Maudlin, MD. With the following instrument(s): Curette to remove Non-Viable tissue/material. Material removed includes Eschar and Slough and. No specimens were taken. A time out was conducted at 14:34, prior to the start of the procedure. A Minimum amount of bleeding was controlled with Pressure. The procedure was tolerated well with a pain level of 0 throughout and a pain level of 0 following the procedure. Post Debridement Measurements: 4cm length x 3cm width x 0.1cm depth; 0.942cm^3 volume. Post debridement Stage noted as Category/Stage II. Character of Wound/Ulcer Post Debridement requires further debridement. Post procedure Diagnosis Wound #1: Same as Pre-Procedure General Notes: Scribed for Dr. Celine Ahr by Blanche East, RN. Plan Follow-up Appointments: Return Appointment in 1 week. - Dr. Celine Ahr Rm 4 Friday 05/01/22 at 1:00PM Anesthetic: (In clinic) Topical Lidocaine 5% applied to wound bed - in clinic, prior to debridement Bathing/ Shower/ Hygiene: May shower and wash wound with soap and water. Other Bathing/Shower/Hygiene Orders/Instructions: - Apply Triamcinolone cream to affected areas Edema Control - Lymphedema / SCD / Other: Avoid standing for long periods of time. Moisturize legs daily. 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 04/23/2022: The wound in question is an area of excoriation on the  patient's left buttock, adjacent to the gluteal cleft. There is only a tiny area that actually has any skin breakdown. I do not see any evidence whatsoever of a pressure injury. He has evidence of scratching on all the visualized wound surfaces. He has little raised nodules on the extensor surfaces of both forearms. He says these are exceptionally itchy. No open wounds in this location, however. I used a curette to debride a small amount of slough and eschar from the 1 part of the site that was actually open. I think this is all related to scratching. 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 I have prescribed triamcinolone to be applied to all of the itchy portions of his body and to the wound, which we will cover with a foam border dressing. We also placed a referral to dermatology for further evaluation and treatment. He will follow-up here in 1 week's time. Electronic Signature(s) Signed: 04/23/2022 4:25:21 PM By: Fredirick Maudlin MD FACS Previous Signature: 04/23/2022 3:33:43 PM Version By: Fredirick Maudlin MD FACS Entered By: Fredirick Maudlin on 04/23/2022 16:25:21 -------------------------------------------------------------------------------- HxROS Details Patient Name: Date of Service: MA Wayne Booth, RO NA LD W. 04/23/2022 1:30 PM Medical Record Number: [id number] Patient Account Number: [id number] Date of Birth/Sex: Treating RN: 07-04-1940 (81 y.o. Waldron Session Primary Care Provider: [name] Other Clinician: Referring Provider: Treating Provider/Extender: Delman Kitten in Treatment: 0 Constitutional Symptoms (General Health) Complaints and Symptoms: Negative for: Fatigue; Fever; Chills; Marked Weight Change Eyes Complaints and Symptoms: Rissmiller, BRAILYN DELMAN ([id number]) [id number] Page 8 of 10 Positive for: Glasses / Contacts - Readers Medical History: Negative for:  Cataracts; Glaucoma; Optic Neuritis Cardiovascular Complaints and Symptoms: Negative for: Chest pain Medical History: Positive for: Hypertension Gastrointestinal Complaints and Symptoms: Negative for: Frequent diarrhea; Nausea Medical History: Negative for: Cirrhosis ; Colitis; Crohns; Hepatitis A; Hepatitis B; Hepatitis C Psychiatric Complaints and Symptoms: Negative for: Claustrophobia Medical History: Positive for: Confinement Anxiety Respiratory Medical History: Positive for: Chronic Obstructive Pulmonary Disease (COPD) Endocrine Medical History: Negative for: Type I Diabetes; Type II Diabetes Genitourinary Medical History: Negative for: End Stage Renal Disease Immunological Medical History: Negative for: Lupus Erythematosus; Raynauds Integumentary (Skin) Complaints and Symptoms: Review of System Notes: Sacrum Medical History: Negative for: History of Burn Musculoskeletal Medical History: Positive for: Gout Negative for: Rheumatoid Arthritis; Osteoarthritis; Osteomyelitis Neurologic Medical History: Positive for: Neuropathy Negative for: Seizure Disorder Oncologic Medical History: Past Medical History Notes: 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 Electronic Signature(s) Signed: 04/24/2022 8:01:11 AM By: Fredirick Maudlin MD FACS Signed: 04/28/2022 4:16:20 PM By: Blanche East RN Entered  By: Blanche East on 04/23/2022 14:02:02 -------------------------------------------------------------------------------- SuperBill Details Patient Name: Date of Service: MA Wayne Booth, RO NA LD W. 04/23/2022 Medical Record Number: [id number]  Patient Account Number: [id number] Date of Birth/Sex: Treating RN: Aug 09, 1940 (81 y.o. M) Primary Care Provider: [name] Other Clinician: Referring Provider: Treating Provider/Extender: Delman Kitten in Treatment: 0 Diagnosis Coding ICD-10 Codes Code Description [phone] Non-pressure chronic ulcer of other part of left lower leg with fat layer exposed L29.9 Pruritus, unspecified I50.30 Unspecified diastolic (congestive) heart failure J96.11 Chronic respiratory failure with hypoxia J84.10 Pulmonary fibrosis, unspecified N18.30 Chronic kidney disease, stage 3 unspecified Facility Procedures : CPT4 Code: [id number] Description: [place] VISIT-LEV 3 EST PT Modifier: 25 Quantity: 1 : CPT4 Code: [id number] Description: 61901 - DEBRIDE WOUND 1ST 20 SQ CM OR < ICD-10 Diagnosis Description L97.822 Non-pressure chronic ulcer of other part of left lower leg with fat layer expose Modifier: d Quantity: 1 Physician Procedures : CPT4 Code Description Modifier [id number] 64314 - WC PHYS LEVEL 4 - NEW PT 25 ICD-10 Diagnosis Description L97.822 Non-pressure chronic ulcer of other part of left lower leg with fat layer exposed L29.9 Pruritus, unspecified I50.30 Unspecified diastolic  (congestive) heart failure J96.11 Chronic respiratory failure with hypoxia Quantity: 1 : [id number] 00349 - WC PHYS DEBR WO ANESTH 20 SQ CM ICD-10 Diagnosis Description L97.822 Non-pressure chronic ulcer of other part of left lower leg with fat layer exposed Quantity: 1 Electronic Signature(s) Signed: 04/24/2022 8:01:11 AM By: Fredirick Maudlin MD FACS Signed: 04/28/2022 4:16:20 PM By: Blanche East RN [place], Signed: 04/28/2022 4:16:20 PM By: Blanche East RN Wayne Booth ([id number]) [id number] Page 10 of 10 Previous Signature: 04/23/2022 3:35:38 PM Version By: Fredirick Maudlin MD FACS Entered By: Blanche East on 04/23/2022 16:46:11

## 2022-04-28 NOTE — Progress Notes (Signed)
Wayne Booth, Wayne Booth ([id number]) [id number] Page 1 of 4 Visit Report for 04/23/2022 Abuse Risk Screen Details Patient Name: Date of Service: Wayne Booth, [place] Wayne LD W. 04/23/2022 1:30 PM Medical Record Number: [id number] Patient Account Number: [id number] Date of Birth/Sex: Treating RN: 26-Apr-1941 (81 y.o. Wayne Booth Primary Care Taejah Ohalloran: [name] Other Clinician: Referring Shiraz Bastyr: Treating Hae Ahlers/Extender: Wayne Booth in Treatment: 0 Abuse Risk Screen Items Answer ABUSE RISK SCREEN: Has anyone close to you tried to hurt or harm you recentlyo No Do you feel uncomfortable with anyone in your familyo No Has anyone forced you do things that you didnt want to doo No Electronic Signature(s) Signed: 04/28/2022 4:16:20 PM By: Blanche East RN Entered By: Blanche East on 04/23/2022 14:02:30 -------------------------------------------------------------------------------- Activities of Daily Living Details Patient Name: Date of Service: Wayne Booth, [place] Wayne LD W. 04/23/2022 1:30 PM Medical Record Number: [id number] Patient Account Number: [id number] Date of Birth/Sex: Treating RN: December 22, 1940 (81 y.o. Wayne Booth Primary Care Marvyn Torrez: [name] Other Clinician: Referring Johnnathan Hagemeister: Treating Kullen Tomasetti/Extender: Wayne Booth in Treatment: 0 Activities of Daily Living Items Answer Activities of Daily Living (Please select one for each item) Drive Automobile Need Assistance T Medications ake Need Assistance Use T elephone Completely Able Care for Appearance Completely Able Use T oilet Completely Able Bath / Shower Completely Able Dress Self Completely Able Feed Self Completely Able Walk Completely Able Get In / Out Bed Completely Able Housework Need Assistance Prepare Meals Need Assistance Handle Money Completely Able Shop for Self Need Assistance Electronic Signature(s) Signed:  04/28/2022 4:16:20 PM By: Blanche East RN Entered By: Blanche East on 04/23/2022 14:03:07 Mccombie, Wayne Booth ([id number]) [id number] Page 2 of 4 -------------------------------------------------------------------------------- Education Screening Details Patient Name: Date of Service: Wayne Booth, [place] LD W. 04/23/2022 1:30 PM Medical Record Number: [id number] Patient Account Number: [id number] Date of Birth/Sex: Treating RN: August 04, 1940 (81 y.o. Wayne Booth Primary Care Korinne Greenstein: [name] Other Clinician: Referring Jacobie Stamey: Treating Atarah Cadogan/Extender: Wayne Booth in Treatment: 0 Primary Learner Assessed: Patient Learning Preferences/Education Level/Primary Language Learning Preference: Explanation Highest Education Level: College or Above Preferred Language: English Cognitive Barrier Language Barrier: No Translator Needed: No Memory Deficit: No Emotional Barrier: No Cultural/Religious Beliefs Affecting Medical Care: No Physical Barrier Impaired Vision: No Impaired Hearing: No Decreased Hand dexterity: No Knowledge/Comprehension Knowledge Level: High Comprehension Level: High Ability to understand written instructions: High Ability to understand verbal instructions: High Motivation Anxiety Level: Calm Cooperation: Cooperative Education Importance: Acknowledges Need Interest in Health Problems: Asks Questions Perception: Coherent Willingness to Engage in Self-Management High Activities: Readiness to Engage in Self-Management High Activities: Electronic Signature(s) Signed: 04/28/2022 4:16:20 PM By: Blanche East RN Entered By: Blanche East on 04/23/2022 14:03:51 -------------------------------------------------------------------------------- Fall Risk Assessment Details Patient Name: Date of Service: Wayne Booth, [place] Wayne LD W. 04/23/2022 1:30 PM Medical Record Number: [id number] Patient Account Number:  [id number] Date of Birth/Sex: Treating RN: 09-Aug-1940 (81 y.o. Wayne Booth Primary Care Britt Petroni: [name] Other Clinician: Referring Achille Xiang: Treating Tamekia Rotter/Extender: Wayne Booth in Treatment: 0 Fall Risk Assessment Items Have you had 2 or more falls in the last 12 monthso 0 No Wayne Booth, Wayne Booth ([id number]) [phone] [id number] Page 3 of 4 Have you had any fall that resulted in injury in the last 12 monthso 0 No FALLS RISK SCREEN History of falling - immediate or within 3 months 0 No Secondary diagnosis (Do you have 2 or more  medical diagnoseso) 0 No Ambulatory aid None/bed rest/wheelchair/nurse 0 No Crutches/cane/walker 0 No Furniture 0 No Intravenous therapy Access/Saline/Heparin Lock 0 No Gait/Transferring Normal/ bed rest/ wheelchair 0 No Weak (short steps with or without shuffle, stooped but able to lift head while walking, may seek 10 Yes support from furniture) Impaired (short steps with shuffle, may have difficulty arising from chair, head down, impaired 0 No balance) Mental Status Oriented to own ability 0 Yes Electronic Signature(s) Signed: 04/28/2022 4:16:20 PM By: Blanche East RN Entered By: Blanche East on 04/23/2022 14:04:11 -------------------------------------------------------------------------------- Foot Assessment Details Patient Name: Date of Service: Wayne Booth, [place] Wayne LD W. 04/23/2022 1:30 PM Medical Record Number: [id number] Patient Account Number: [id number] Date of Birth/Sex: Treating RN: 08/21/1940 (81 y.o. Wayne Booth Primary Care Shatavia Santor: [name] Other Clinician: Referring Chantella Creech: Treating Damonie Ellenwood/Extender: Wayne Booth in Treatment: 0 Foot Assessment Items Site Locations + = Sensation present, - = Sensation absent, C = Callus, U = Ulcer R = Redness, W = Warmth, M = Maceration, PU = Pre-ulcerative lesion F = Fissure, S = Swelling, D =  Dryness Assessment Right: Left: Other Deformity: No No Prior Foot Ulcer: No No Prior Amputation: No No Charcot Joint: No No Ambulatory Status: Ambulatory Without Help Gait: Steady Kubitz, UTAH DELAUDER ([id number]) [phone] [id number] Page 4 of 4 Electronic Signature(s) Signed: 04/28/2022 4:16:20 PM By: Blanche East RN Entered By: Blanche East on 04/23/2022 14:05:22 -------------------------------------------------------------------------------- Nutrition Risk Screening Details Patient Name: Date of Service: Wayne Booth, [place] Wayne LD W. 04/23/2022 1:30 PM Medical Record Number: [id number] Patient Account Number: [id number] Date of Birth/Sex: Treating RN: Dec 11, 1940 (81 y.o. Wayne Booth Primary Care Beatric Fulop: [name] Other Clinician: Referring Ahmaya Ostermiller: Treating Dierks Wach/Extender: Wayne Booth in Treatment: 0 Height (in): 71 Weight (lbs): Body Mass Index (BMI): Nutrition Risk Screening Items Score Screening NUTRITION RISK SCREEN: I have an illness or condition that made me change the kind and/or amount of food I eat 0 No I eat fewer than two meals per day 0 No I eat few fruits and vegetables, or milk products 0 No I have three or more drinks of beer, liquor or wine almost every day 0 No I have tooth or mouth problems that make it hard for me to eat 0 No I don't always have enough money to buy the food I need 0 No I eat alone most of the time 0 No I take three or more different prescribed or over-the-counter drugs a day 1 Yes Without wanting to, I have lost or gained 10 pounds in the last six months 0 No I am not always physically able to shop, cook and/or feed myself 0 No Nutrition Protocols Good Risk Protocol 0 No interventions needed Moderate Risk Protocol High Risk Proctocol Risk Level: Good Risk Score: 1 Electronic Signature(s) Signed: 04/28/2022 4:16:20 PM By: Blanche East RN Entered By: Blanche East on  04/23/2022 14:05:09

## 2022-05-01 ENCOUNTER — Encounter (HOSPITAL_BASED_OUTPATIENT_CLINIC_OR_DEPARTMENT_OTHER): Payer: Medicare Other | Admitting: General Surgery

## 2022-05-04 DIAGNOSIS — G4733 Obstructive sleep apnea (adult) (pediatric): Secondary | ICD-10-CM | POA: Diagnosis not present

## 2022-06-03 DIAGNOSIS — G4733 Obstructive sleep apnea (adult) (pediatric): Secondary | ICD-10-CM | POA: Diagnosis not present

## 2022-06-05 ENCOUNTER — Encounter: Payer: Self-pay | Admitting: Adult Health

## 2022-06-05 ENCOUNTER — Ambulatory Visit (INDEPENDENT_AMBULATORY_CARE_PROVIDER_SITE_OTHER): Payer: Medicare Other | Admitting: Adult Health

## 2022-06-05 ENCOUNTER — Ambulatory Visit (INDEPENDENT_AMBULATORY_CARE_PROVIDER_SITE_OTHER): Payer: Medicare Other

## 2022-06-05 VITALS — BP 118/64 | HR 88 | Temp 98.2°F | Ht 71.5 in | Wt 233.8 lb

## 2022-06-05 DIAGNOSIS — I5032 Chronic diastolic (congestive) heart failure: Secondary | ICD-10-CM

## 2022-06-05 DIAGNOSIS — J4550 Severe persistent asthma, uncomplicated: Secondary | ICD-10-CM

## 2022-06-05 DIAGNOSIS — J9611 Chronic respiratory failure with hypoxia: Secondary | ICD-10-CM

## 2022-06-05 DIAGNOSIS — J8410 Pulmonary fibrosis, unspecified: Secondary | ICD-10-CM | POA: Diagnosis not present

## 2022-06-05 DIAGNOSIS — R0609 Other forms of dyspnea: Secondary | ICD-10-CM

## 2022-06-05 DIAGNOSIS — G4733 Obstructive sleep apnea (adult) (pediatric): Secondary | ICD-10-CM | POA: Diagnosis not present

## 2022-06-05 DIAGNOSIS — I8290 Acute embolism and thrombosis of unspecified vein: Secondary | ICD-10-CM

## 2022-06-05 DIAGNOSIS — J90 Pleural effusion, not elsewhere classified: Secondary | ICD-10-CM | POA: Diagnosis not present

## 2022-06-05 MED ORDER — BREZTRI AEROSPHERE 160-9-4.8 MCG/ACT IN AERO: 2.0000 | INHALATION_SPRAY | Freq: Two times a day (BID) | RESPIRATORY_TRACT | 0 refills | Status: DC

## 2022-06-05 MED ORDER — PREDNISONE 10 MG PO TABS: ORAL_TABLET | ORAL | 0 refills | Status: DC

## 2022-06-05 NOTE — Assessment & Plan Note (Signed)
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  He endorses compliance.  Continue on current regimen.  CBC today.

## 2022-06-05 NOTE — Addendum Note (Signed)
Addended by: Vanessa Barbara on: 06/05/2022 03:50 PM   Modules accepted: Orders

## 2022-06-05 NOTE — Assessment & Plan Note (Signed)
Patient appears euvolemic on exam.  Will check BNP.  Chest x-ray is pending.

## 2022-06-05 NOTE — Assessment & Plan Note (Signed)
Possible flare -  Check chest x-ray today.  Labs are pending. Treat with empiric course of steroids.  Continue on maintenance regimen with Breztri albuterol inhaler or nebulizer as needed  Plan  Patient Instructions  Chest xray and labs today.  Prednisone taper over next week  Mucinex DM Twice daily  As needed  Cough /congestion  Continue on BREZTRI 2 puffs Twice daily  -please take as directed.  Albuterol Inhaler 2 puffs every 4hrs as needed for wheezing .  May use Albuterol Neb every 4-6hr as needed.  Begin Oxygen 2l/m 24/7 , wear all the time.  Low salt diet . Legs elevated .  Work on weight loss .  Call back if you change your mind on CPAP titration study .  Advance activity as tolerated  Follow up with Dr. Elsworth Soho  Or Zyla Dascenzo NP in 1-2 weeks  Please contact office for sooner follow up if symptoms do not improve or worsen or seek emergency care

## 2022-06-05 NOTE — Assessment & Plan Note (Signed)
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  Possibly on underlying fibrosis flare.  Will check labs and chest x-ray today.  Empiric trial of prednisone taper.  Patient is advised if symptoms are improving will need sooner follow-up or seek emergency room care.  Plan  Patient Instructions  Chest xray and labs today.  Prednisone taper over next week  Mucinex DM Twice daily  As needed  Cough /congestion  Continue on BREZTRI 2 puffs Twice daily  -please take as directed.  Albuterol Inhaler 2 puffs every 4hrs as needed for wheezing .  May use Albuterol Neb every 4-6hr as needed.  Begin Oxygen 2l/m 24/7 , wear all the time.  Low salt diet . Legs elevated .  Work on weight loss .  Call back if you change your mind on CPAP titration study .  Advance activity as tolerated  Follow up with Dr. Elsworth Soho  Or Cardell Rachel NP in 1-2 weeks  Please contact office for sooner follow up if symptoms do not improve or worsen or seek emergency care

## 2022-06-05 NOTE — Patient Instructions (Addendum)
Chest xray and labs today.  Prednisone taper over next week  Mucinex DM Twice daily  As needed  Cough /congestion  Continue on BREZTRI 2 puffs Twice daily  -please take as directed.  Albuterol Inhaler 2 puffs every 4hrs as needed for wheezing .  May use Albuterol Neb every 4-6hr as needed.  Begin Oxygen 2l/m 24/7 , wear all the time.  Low salt diet . Legs elevated .  Work on weight loss .  Call back if you change your mind on CPAP titration study .  Advance activity as tolerated  Follow up with Dr. Elsworth Soho  Or Bronson Bressman NP in 1-2 weeks  Please contact office for sooner follow up if symptoms do not improve or worsen or seek emergency care

## 2022-06-05 NOTE — Assessment & Plan Note (Signed)
Patient has a history of moderate to severe sleep apnea.  Has declined CPAP.  We did rediscuss this today in the office.  Suggested a CPAP titration study.  Patient declines at this time.

## 2022-06-05 NOTE — Progress Notes (Signed)
$'@Patient'k$  ID: Wayne Booth, male    DOB: January 19, 1941, 81 y.o.   MRN: [id number]  Chief Complaint  Patient presents with   Acute Visit    Referring provider: Marin Olp, MD  HPI: 81 year old male with minimal smoking history followed for asthmatic bronchitis with allergic component, recurrent pneumonia, obstructive sleep apnea (declined CPAP), postinflammatory fibrosis, chronic respiratory failure on oxygen with activity and at bedtime COVID-19 infection October 2020 requiring hospitalization treated with remdesivir and Decadron Previous history of PE in 2018 on lifelong anticoagulation therapy Medical history significant for pituitary tumor removal, atrial flutter, renal cell carcinoma status post nephrectomy and diastolic heart failure  TEST/EVENTS :  CT chest June 08, 2017 showed a small left lower lobe area of consolidation.     PFT 07/2017 moderate restriction, DLCO 67%   CT chest October 2019 small to moderate bilateral pleural effusions consolidations within the left greater than right lower lobe and lingula.  Hazy density in the left greater than right lower lobe.   2D echo February 04, 2018 showed EF 50 to 55%   12/2018 HRCT Chest -patchy parenchymal groundglass with interstitial coarsening bilaterally similar to 2019 from a multi lobar pneumonia felt to be a component of postinflammatory changes not consistent with UIP.   07/2019 Eosinophils 100>700>800  IGE  367  ANA, CCP , ANCA neg . RA 16.7    Sleep study September 18, 2019 showed moderate sleep apnea with AHI at 26/hour SPO2 low at 82%   PFT shows a decline in lung function with FEV1 at 41%, ratio 88, FVC 34%, no significant bronchodilator response, DLCO 61%.  (Previous DLCO 67%, FEV1 61%-2019)   High-resolution CT chest January 16, 2021 fibrotic interstitial lung disease, without frank honeycombing mildly progressed since July 2021.  Mild basilar predominance and groundglass predominance.  Postinflammatory fibrosis  due to history of COVID infection and a fibrotic NSIP pattern is favored.   06/05/2022 Acute OV: Asthma  Patient presents for an acute office visit.  Last seen November 2022 complains of 3 weeks of increased shortness of breath and decreased activity tolerance.  Patient says he gets out of breath with minimum activities.  He denies any increased cough, congestion, fever, body aches, increased leg swelling.  Patient states his appetite has been decreased over the last couple years.  His weight is down about 6 pounds over the last year but over the last couple years he is down about 70 pounds.  Patient says over the last 6 months he feels like he has been going slowly downhill.  He is not able to do as much.  He is on oxygen but wears it only when he feels he needs it and he does use it at nighttime.  He remains on Breztri inhaler twice daily.  Has not tried his albuterol or nebulizer.  Patient does have underlying sleep apnea but has declined CPAP in the past.  We did talk about evaluation of his sleep apnea again.  Patient wants to hold off at this time.  Patient had a previous right and left heart cath.  With known pulmonary hypertension.  Patient does have Lasix but says he very rarely ever takes it.  He did have lab work in October that was essentially unrevealing.  Allergies  Allergen Reactions   Ambien [Zolpidem] Anxiety    Anxiety and agitation when tried as sleeper in hospital aug 2019    Immunization History  Administered Date(s) Administered   Fluad Quad(high Dose 65+) 03/16/2019,  05/14/2020, 06/10/2021, 04/13/2022   Influenza Split 04/01/2011, 03/29/2012   Influenza Whole 04/12/2007, 03/18/2009, 03/04/2010   Influenza, High Dose Seasonal PF 04/03/2014, 04/04/2015, 04/24/2016, 04/12/2017, 04/26/2018   Influenza,inj,Quad PF,6+ Mos 03/22/2013   Influenza-Unspecified 05/29/2017   PFIZER(Purple Top)SARS-COV-2 Vaccination 08/19/2019, 09/11/2019, 06/09/2020   PNEUMOCOCCAL CONJUGATE-20  12/26/2020   Pneumococcal Conjugate-13 11/12/2014   Pneumococcal Polysaccharide-23 06/09/2011   Pneumococcal-Unspecified 06/30/2015   Td 06/29/1994   Tdap 06/09/2011   Zoster Recombinat (Shingrix) 03/16/2019, 07/19/2019   Zoster, Live 09/22/2007    Past Medical History:  Diagnosis Date   Allergy    States his nose runs when he is around grease.    Anxiety    Arthritis    Atrial fibrillation (HCC)    Cancer ([place])    Coronary artery disease    History of total knee replacement    Hypertension    Hypothyroidism    Kidney cancer, primary, with metastasis from kidney to other site [facility])    Kidney stone    OSA (obstructive sleep apnea)    pt does not wear cpap at night   Pituitary cyst ([place])    Pneumonia    hx of   Prostate cancer ([place])     Tobacco History: Social History   Tobacco Use  Smoking Status Never  Smokeless Tobacco Never   Counseling given: Not Answered   Outpatient Medications Prior to Visit  Medication Sig Dispense Refill   albuterol (VENTOLIN HFA) 108 (90 Base) MCG/ACT inhaler Inhale 2 puffs into the lungs every 6 (six) hours as needed for wheezing or shortness of breath. 18 g 2   apixaban (ELIQUIS) 5 MG TABS tablet Take 1 tablet (5 mg total) by mouth 2 (two) times daily. 60 tablet 10   Budeson-Glycopyrrol-Formoterol (BREZTRI AEROSPHERE) 160-9-4.8 MCG/ACT AERO Inhale 2 puffs into the lungs in the morning and at bedtime. 10.7 g 11   busPIRone (BUSPAR) 5 MG tablet TAKE ONE TABLET BY MOUTH TWICE A DAY AS NEEDED FOR ANXIETY 180 tablet 1   escitalopram (LEXAPRO) 20 MG tablet Take 1 tablet (20 mg total) by mouth daily. 90 tablet 3   gabapentin (NEURONTIN) 400 MG capsule Take 1 capsule (400 mg total) by mouth 3 (three) times daily. 90 capsule 4   Suvorexant (BELSOMRA) 5 MG TABS Take 5 mg by mouth at bedtime as needed (for sleep- NO screens 30 minutes before bed). 30 tablet 5   traMADol (ULTRAM) 50 MG tablet Take 1 tablet (50 mg total) by mouth every 6 (six) hours  as needed (prefer max 3 per day). 90 tablet 3   albuterol (PROVENTIL) (2.5 MG/3ML) 0.083% nebulizer solution Take 3 mLs (2.5 mg total) by nebulization every 6 (six) hours as needed for wheezing or shortness of breath. (Patient not taking: Reported on 06/05/2022) 75 mL 3   furosemide (LASIX) 40 MG tablet Take 1 tablet (40 mg total) by mouth daily as needed for fluid or edema (or weight gain). (Patient not taking: Reported on 06/05/2022) 120 tablet 4   Multiple Vitamins-Minerals (MULTIVITAMINS THER. W/MINERALS) TABS Take 1 tablet by mouth daily. (Patient not taking: Reported on 06/05/2022)     Gauze Pads & Dressings (POLYMEM NON-ADHESIVE) 4"X4" PADS Change dressing daily (Patient not taking: Reported on 06/05/2022) 15 each 0   No facility-administered medications prior to visit.     Review of Systems:   Constitutional:   No  weight loss, night sweats,  Fevers, chills, + fatigue, or  lassitude.  HEENT:   No headaches,  Difficulty swallowing,  Tooth/dental problems, or  Sore throat,                No sneezing, itching, ear ache, nasal congestion, post nasal drip,   CV:  No chest pain,  Orthopnea, PND, swelling in lower extremities, anasarca, dizziness, palpitations, syncope.   GI  No heartburn, indigestion, abdominal pain, nausea, vomiting, diarrhea, change in bowel habits, loss of appetite, bloody stools.   Resp:   No chest wall deformity  Skin: no rash or lesions.  GU: no dysuria, change in color of urine, no urgency or frequency.  No flank pain, no hematuria   MS:  No joint pain or swelling.  No decreased range of motion.  No back pain.    Physical Exam  BP 118/64 (BP Location: Left Arm, Patient Position: Sitting, Cuff Size: Large)   Pulse 88   Temp 98.2 F (36.8 C) (Oral)   Ht 5' 11.5" (1.816 m)   Wt 233 lb 12.8 oz (106.1 kg)   SpO2 90%   BMI 32.15 kg/m   GEN: A/Ox3; pleasant , NAD, elderly   HEENT:  [place]/AT,  NOSE-clear, THROAT-clear, no lesions, no postnasal drip or exudate  noted.   NECK:  Supple w/ fair ROM; no JVD; normal carotid impulses w/o bruits; no thyromegaly or nodules palpated; no lymphadenopathy.    RESP bibasilar crackles. no accessory muscle use, no dullness to percussion  CARD:  RRR, no m/r/g, tr  peripheral edema, pulses intact, no cyanosis or clubbing.  GI:   Soft & nt; nml bowel sounds; no organomegaly or masses detected.   Musco: Warm bil, no deformities or joint swelling noted.   Neuro: alert, no focal deficits noted.    Skin: Warm, no lesions or rashes    Lab Results:  CBC    Component Value Date/Time   WBC 9.2 04/13/2022 1548   RBC 5.16 04/13/2022 1548   HGB 15.5 04/13/2022 1548   HGB 16.6 07/12/2019 1509   HCT 46.1 04/13/2022 1548   HCT 49.6 07/12/2019 1509   PLT 407.0 (H) 04/13/2022 1548   PLT 383 07/12/2019 1509   MCV 89.2 04/13/2022 1548   MCV 91 07/12/2019 1509   MCH 30.4 07/12/2019 1509   MCH 31.0 05/29/2019 1628   MCHC 33.6 04/13/2022 1548   RDW 14.7 04/13/2022 1548   RDW 13.2 07/12/2019 1509   LYMPHSABS 1.4 04/13/2022 1548   MONOABS 1.0 04/13/2022 1548   EOSABS 0.4 04/13/2022 1548   BASOSABS 0.1 04/13/2022 1548    BMET    Component Value Date/Time   NA 134 (L) 04/13/2022 1548   NA 138 07/12/2019 1509   K 4.6 04/13/2022 1548   CL 99 04/13/2022 1548   CO2 30 04/13/2022 1548   GLUCOSE 85 04/13/2022 1548   BUN 13 04/13/2022 1548   BUN 15 07/12/2019 1509   CREATININE 1.12 04/13/2022 1548   CREATININE 1.16 11/27/2016 1555   CALCIUM 9.6 04/13/2022 1548   GFRNONAA 62 07/12/2019 1509   GFRAA 72 07/12/2019 1509    BNP    Component Value Date/Time   BNP 55.3 05/29/2019 1628    ProBNP    Component Value Date/Time   PROBNP 40.0 05/16/2021 1500    Imaging: No results found.       Latest Ref Rng & Units 10/10/2019   12:00 PM 08/11/2017   11:00 AM  PFT Results  FVC-Pre L 1.43  2.20  P  FVC-Predicted Pre % 35  54  P  FVC-Post L 1.36  2.16  P  FVC-Predicted Post % 34  53  P  Pre FEV1/FVC %  % 84  79  P  Post FEV1/FCV % % 88  84  P  FEV1-Pre L 1.20  1.74  P  FEV1-Predicted Pre % 41  59  P  FEV1-Post L 1.20  1.81  P  DLCO uncorrected ml/min/mmHg 14.73  20.88  P  DLCO UNC% % 61  67  P  DLCO corrected ml/min/mmHg 14.73    DLCO COR %Predicted % 61    DLVA Predicted % 129  132  P  TLC L 3.45  4.77  P  TLC % Predicted % 50  69  P  RV % Predicted % 71  96  P    P Preliminary result    No results found for: "NITRICOXIDE"      Assessment & Plan:   Pulmonary fibrosis, 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HCC) Patient has known postinflammatory fibrosis-CT chest and 2022 did show some mild progression.  Possibly on underlying fibrosis flare.  Will check labs and chest x-ray today.  Empiric trial of prednisone taper.  Patient is advised if symptoms are improving will need sooner follow-up or seek emergency room care.  Plan  Patient Instructions  Chest xray and labs today.  Prednisone taper over next week  Mucinex DM Twice daily  As needed  Cough /congestion  Continue on BREZTRI 2 puffs Twice daily  -please take as directed.  Albuterol Inhaler 2 puffs every 4hrs as needed for wheezing .  May use Albuterol Neb every 4-6hr as needed.  Begin Oxygen 2l/m 24/7 , wear all the time.  Low salt diet . Legs elevated .  Work on weight loss .  Call back if you change your mind on CPAP titration study .  Advance activity as tolerated  Follow up with Dr. Elsworth Soho  Or Zaidee Rion NP in 1-2 weeks  Please contact office for sooner follow up if symptoms do not improve or worsen or seek emergency care         OSA (obstructive sleep apnea) Patient has a history of moderate to severe sleep apnea.  Has declined CPAP.  We did rediscuss this today in the office.  Suggested a CPAP titration study.  Patient declines at this time.  Chronic respiratory failure with hypoxia [facility]) Patient has known exertional hypoxemia and nocturnal hypoxemia.  Patient did not wear his oxygen initiated office.  Have encouraged him to use  his oxygen 24/7.  Has increased symptom burden.  Would like for him to avoid exertional hypoxemia if possible.  VTE (venous thromboembolism) History of PE on lifelong anticoagulation.  He endorses compliance.  Continue on current regimen.  CBC today.  (HFpEF) heart failure with preserved ejection fraction [facility]) Patient appears euvolemic on exam.  Will check BNP.  Chest x-ray is pending.  Asthmatic bronchitis Possible flare -  Check chest x-ray today.  Labs are pending. Treat with empiric course of steroids.  Continue on maintenance regimen with Breztri albuterol inhaler or nebulizer as needed  Plan  Patient Instructions  Chest xray and labs today.  Prednisone taper over next week  Mucinex DM Twice daily  As needed  Cough /congestion  Continue on BREZTRI 2 puffs Twice daily  -please take as directed.  Albuterol Inhaler 2 puffs every 4hrs as needed for wheezing .  May use Albuterol Neb every 4-6hr as needed.  Begin Oxygen 2l/m 24/7 , wear all the time.  Low salt diet . Legs elevated .  Work  on weight loss .  Call back if you change your mind on CPAP titration study .  Advance activity as tolerated  Follow up with Dr. Elsworth Soho  Or Jamesa Tedrick NP in 1-2 weeks  Please contact office for sooner follow up if symptoms do not improve or worsen or seek emergency care           I spent   40 minutes dedicated to the care of this patient on the date of this encounter to include pre-visit review of records, face-to-face time with the patient discussing conditions above, post visit ordering of testing, clinical documentation with the electronic health record, making appropriate referrals as documented, and communicating necessary findings to members of the patients care team.   Rexene Edison, NP 06/05/2022

## 2022-06-05 NOTE — Assessment & Plan Note (Signed)
Patient has known exertional hypoxemia and nocturnal hypoxemia.  Patient did not wear his oxygen initiated office.  Have encouraged him to use his oxygen 24/7.  Has increased symptom burden.  Would like for him to avoid exertional hypoxemia if possible.

## 2022-06-08 ENCOUNTER — Other Ambulatory Visit (INDEPENDENT_AMBULATORY_CARE_PROVIDER_SITE_OTHER): Payer: Medicare Other

## 2022-06-08 DIAGNOSIS — R0609 Other forms of dyspnea: Secondary | ICD-10-CM

## 2022-06-08 DIAGNOSIS — J4550 Severe persistent asthma, uncomplicated: Secondary | ICD-10-CM

## 2022-06-08 LAB — BRAIN NATRIURETIC PEPTIDE: Pro B Natriuretic peptide (BNP): 61 pg/mL (ref 0.0–100.0)

## 2022-06-08 LAB — CBC WITH DIFFERENTIAL/PLATELET
Basophils Absolute: 0.1 10*3/uL (ref 0.0–0.1)
Basophils Relative: 0.4 % (ref 0.0–3.0)
Eosinophils Absolute: 0 10*3/uL (ref 0.0–0.7)
Eosinophils Relative: 0.3 % (ref 0.0–5.0)
HCT: 44.2 % (ref 39.0–52.0)
Hemoglobin: 15 g/dL (ref 13.0–17.0)
Lymphocytes Relative: 7.5 % — ABNORMAL LOW (ref 12.0–46.0)
Lymphs Abs: 1 10*3/uL (ref 0.7–4.0)
MCHC: 34 g/dL (ref 30.0–36.0)
MCV: 87.5 fl (ref 78.0–100.0)
Monocytes Absolute: 0.7 10*3/uL (ref 0.1–1.0)
Monocytes Relative: 5.2 % (ref 3.0–12.0)
Neutro Abs: 11.9 10*3/uL — ABNORMAL HIGH (ref 1.4–7.7)
Neutrophils Relative %: 86.6 % — ABNORMAL HIGH (ref 43.0–77.0)
Platelets: 413 10*3/uL — ABNORMAL HIGH (ref 150.0–400.0)
RBC: 5.05 Mil/uL (ref 4.22–5.81)
RDW: 14.8 % (ref 11.5–15.5)
WBC: 13.7 10*3/uL — ABNORMAL HIGH (ref 4.0–10.5)

## 2022-06-08 LAB — BASIC METABOLIC PANEL
BUN: 19 mg/dL (ref 6–23)
CO2: 29 mEq/L (ref 19–32)
Calcium: 9.4 mg/dL (ref 8.4–10.5)
Chloride: 99 mEq/L (ref 96–112)
Creatinine, Ser: 0.97 mg/dL (ref 0.40–1.50)
GFR: 74.01 mL/min (ref >60.00)
Glucose, Bld: 120 mg/dL — ABNORMAL HIGH (ref 70–99)
Potassium: 3.9 mEq/L (ref 3.5–5.1)
Sodium: 134 mEq/L — ABNORMAL LOW (ref 135–145)

## 2022-06-09 MED ORDER — AMOXICILLIN-POT CLAVULANATE 875-125 MG PO TABS: 1.0000 | ORAL_TABLET | Freq: Two times a day (BID) | ORAL | 0 refills | Status: DC

## 2022-06-15 ENCOUNTER — Ambulatory Visit: Payer: Medicare Other | Admitting: Adult Health

## 2022-07-04 DIAGNOSIS — G4733 Obstructive sleep apnea (adult) (pediatric): Secondary | ICD-10-CM | POA: Diagnosis not present

## 2022-07-14 ENCOUNTER — Telehealth: Payer: Self-pay | Admitting: Family Medicine

## 2022-07-14 NOTE — Telephone Encounter (Signed)
Patient states: - Recently lost his best friend and has been experiencing increased anxiety  - Wanted to know if PCP could provide help with "knocking the nerves out"   I offered pt an appointment and he agreed. Pt has been scheduled for 07/21/22 @ 4pm, but would like to know if PCP could do anything prior to [place].

## 2022-07-14 NOTE — Telephone Encounter (Signed)
Ok to work pt in sooner?

## 2022-07-15 NOTE — Telephone Encounter (Signed)
LVM to schedule with another provider if pt agrees to.

## 2022-07-16 ENCOUNTER — Encounter: Payer: Medicare Other | Admitting: Family Medicine

## 2022-07-17 ENCOUNTER — Ambulatory Visit (INDEPENDENT_AMBULATORY_CARE_PROVIDER_SITE_OTHER): Payer: Medicare Other

## 2022-07-17 ENCOUNTER — Encounter: Payer: Self-pay | Admitting: Adult Health

## 2022-07-17 ENCOUNTER — Ambulatory Visit: Payer: Medicare Other | Admitting: Adult Health

## 2022-07-17 VITALS — BP 110/74 | HR 94 | Temp 97.6°F | Ht 71.0 in | Wt 235.6 lb

## 2022-07-17 DIAGNOSIS — J4550 Severe persistent asthma, uncomplicated: Secondary | ICD-10-CM | POA: Diagnosis not present

## 2022-07-17 DIAGNOSIS — J449 Chronic obstructive pulmonary disease, unspecified: Secondary | ICD-10-CM | POA: Diagnosis not present

## 2022-07-17 DIAGNOSIS — J8410 Pulmonary fibrosis, unspecified: Secondary | ICD-10-CM

## 2022-07-17 DIAGNOSIS — R5381 Other malaise: Secondary | ICD-10-CM

## 2022-07-17 DIAGNOSIS — I5032 Chronic diastolic (congestive) heart failure: Secondary | ICD-10-CM | POA: Diagnosis not present

## 2022-07-17 DIAGNOSIS — J90 Pleural effusion, not elsewhere classified: Secondary | ICD-10-CM | POA: Diagnosis not present

## 2022-07-17 NOTE — Patient Instructions (Addendum)
Chest xray today  Mucinex DM Twice daily  As needed  Cough /congestion  Continue on BREZTRI 2 puffs Twice daily  -please take as directed.  Albuterol Inhaler 2 puffs every 4hrs as needed for wheezing .  May use Albuterol Neb every 4-6hr as needed.  Oxygen 2l/m 24/7 , wear all the time.  Low salt diet . Legs elevated .  Work on weight loss .  Advance activity as tolerated  Refer to pulmonary rehab  Follow up with Dr. Elsworth Soho  Or Jkai Arwood NP in 3 months with Spirometry with DLCO  Please contact office for sooner follow up if symptoms do not improve or worsen or seek emergency care

## 2022-07-17 NOTE — Assessment & Plan Note (Addendum)
Fibrotic NS IP with previous COVID-19 infection.  Suspect patient is having progressive changes.  Autoimmune workup in the past was unrevealing.  Will check PFTs on return visit.  Consider repeat high-res going forward.  Plan  Patient Instructions  Chest xray today  Mucinex DM Twice daily  As needed  Cough /congestion  Continue on BREZTRI 2 puffs Twice daily  -please take as directed.  Albuterol Inhaler 2 puffs every 4hrs as needed for wheezing .  May use Albuterol Neb every 4-6hr as needed.  Oxygen 2l/m 24/7 , wear all the time.  Low salt diet . Legs elevated .  Work on weight loss .  Advance activity as tolerated  Refer to pulmonary rehab  Follow up with Dr. Elsworth Soho  Or Taryll Reichenberger NP in 3 months with Spirometry with DLCO  Please contact office for sooner follow up if symptoms do not improve or worsen or seek emergency care

## 2022-07-17 NOTE — Assessment & Plan Note (Signed)
Appears euvolemic on exam.  Continue current regimen

## 2022-07-17 NOTE — Assessment & Plan Note (Signed)
Patient is encouraged on oxygen compliance.  Plan  Patient Instructions  Chest xray today  Mucinex DM Twice daily  As needed  Cough /congestion  Continue on BREZTRI 2 puffs Twice daily  -please take as directed.  Albuterol Inhaler 2 puffs every 4hrs as needed for wheezing .  May use Albuterol Neb every 4-6hr as needed.  Oxygen 2l/m 24/7 , wear all the time.  Low salt diet . Legs elevated .  Work on weight loss .  Advance activity as tolerated  Refer to pulmonary rehab  Follow up with Dr. Elsworth Booth  Or Wayne Sternberg NP in 3 months with Spirometry with DLCO  Please contact office for sooner follow up if symptoms do not improve or worsen or seek emergency care

## 2022-07-17 NOTE — Assessment & Plan Note (Signed)
Activity as tolerated.  Refer to pulmonary rehab

## 2022-07-17 NOTE — Progress Notes (Signed)
$'@Patient'o$  ID: Wayne Booth, male    DOB: [birth year], 82 y.o.   MRN: [id number]  No chief complaint on file.   Referring provider: Marin Olp, MD  HPI: 82 year old male with minimal smoking history followed for asthmatic bronchitis with allergic component, recurrent pneumonia, obstructive sleep apnea (declined CPAP), postinflammatory fibrosis, chronic respiratory failure on oxygen with activity and at bedtime, pulmonary hypertension COVID-19 infection October 2020 requiring hospitalization treated with remdesivir and Decadron Previous history of PE in 2018 on lifelong anticoagulation therapy Medical history significant for pituitary tumor removal, atrial flutter, renal cell carcinoma status post nephrectomy and diastolic heart failure  TEST/EVENTS :  CT chest June 08, 2017 showed a small left lower lobe area of consolidation.     PFT 07/2017 moderate restriction, DLCO 67%   CT chest October 2019 small to moderate bilateral pleural effusions consolidations within the left greater than right lower lobe and lingula.  Hazy density in the left greater than right lower lobe.   2D echo February 04, 2018 showed EF 50 to 55%   12/2018 HRCT Chest -patchy parenchymal groundglass with interstitial coarsening bilaterally similar to 2019 from a multi lobar pneumonia felt to be a component of postinflammatory changes not consistent with UIP.   07/2019 Eosinophils 100>700>800  IGE  367  ANA, neg CCP , ANCA neg . RA 16.7    Sleep study September 18, 2019 showed moderate sleep apnea with AHI at 26/hour SPO2 low at 82%   PFT shows a decline in lung function with FEV1 at 41%, ratio 88, FVC 34%, no significant bronchodilator response, DLCO 61%.  (Previous DLCO 67%, FEV1 61%-2019)   High-resolution CT chest January 16, 2021 fibrotic interstitial lung disease, without frank honeycombing mildly progressed since July 2021.  Mild basilar predominance and groundglass predominance.  Postinflammatory fibrosis  due to history of COVID infection and a fibrotic NSIP pattern is favored.  07/17/2022 Follow up ; Asthma , Post Inflammatory Fibrosis , Chronic Respiratory Failure  Patient presents for 1 month follow-up.  Patient was seen last visit with a asthmatic bronchitic exacerbation.  Chest x-ray showed increased bibasilar opacities concerning for possible progressive ILD plus or minus edema.  Patient was treated with empiric antibiotics with Augmentin and a prednisone taper.  Patient says he is feeling better cough congestion shortness of breath of slightly improved.  But he still gets very short of breath with minimal activities.  Feels that his activity level has decreased over the last year.  Also appetite remains lower.  Weight has slowly decreased over the last couple years, he is down almost 70 pounds.  Patient wants a referral to rehab said he has done this in the past but it really helped him he feels like he is lost a lot of strength.  He remains on Breztri twice daily.  Patient was recommended to wear his oxygen to keep sats above 88 to 9%.  Patient says he does try to wear it while he is home.  He did not bring his portable system in today to the office.  Patient education was given on potential complications of hypoxemia.  Patient denies any hemoptysis, chest pain, orthopnea, edema.   Allergies  Allergen Reactions   Ambien [Zolpidem] Anxiety    Anxiety and agitation when tried as sleeper in hospital aug 2019    Immunization History  Administered Date(s) Administered   Fluad Quad(high Dose 65+) 03/16/2019, 05/14/2020, 06/10/2021, 04/13/2022   Influenza Split 04/01/2011, 03/29/2012   Influenza Whole 04/12/2007, 03/18/2009,  03/04/2010   Influenza, High Dose Seasonal PF 04/03/2014, 04/04/2015, 04/24/2016, 04/12/2017, 04/26/2018   Influenza,inj,Quad PF,6+ Mos 03/22/2013   Influenza-Unspecified 05/29/2017   PFIZER(Purple Top)SARS-COV-2 Vaccination 08/19/2019, 09/11/2019, 06/09/2020   PNEUMOCOCCAL  CONJUGATE-20 12/26/2020   Pneumococcal Conjugate-13 11/12/2014   Pneumococcal Polysaccharide-23 06/09/2011   Pneumococcal-Unspecified 06/30/2015   Td 06/29/1994   Tdap 06/09/2011   Zoster Recombinat (Shingrix) 03/16/2019, 07/19/2019   Zoster, Live 09/22/2007    Past Medical History:  Diagnosis Date   Allergy    States his nose runs when he is around grease.    Anxiety    Arthritis    Atrial fibrillation (HCC)    Cancer ([place])    Coronary artery disease    History of total knee replacement    Hypertension    Hypothyroidism    Kidney cancer, primary, with metastasis from kidney to other site [facility])    Kidney stone    OSA (obstructive sleep apnea)    pt does not wear cpap at night   Pituitary cyst ([place])    Pneumonia    hx of   Prostate cancer ([place])     Tobacco History: Social History   Tobacco Use  Smoking Status Never  Smokeless Tobacco Never   Counseling given: Not Answered   Outpatient Medications Prior to Visit  Medication Sig Dispense Refill   albuterol (PROVENTIL) (2.5 MG/3ML) 0.083% nebulizer solution Take 3 mLs (2.5 mg total) by nebulization every 6 (six) hours as needed for wheezing or shortness of breath. 75 mL 3   albuterol (VENTOLIN HFA) 108 (90 Base) MCG/ACT inhaler Inhale 2 puffs into the lungs every 6 (six) hours as needed for wheezing or shortness of breath. 18 g 2   apixaban (ELIQUIS) 5 MG TABS tablet Take 1 tablet (5 mg total) by mouth 2 (two) times daily. 60 tablet 10   Budeson-Glycopyrrol-Formoterol (BREZTRI AEROSPHERE) 160-9-4.8 MCG/ACT AERO Inhale 2 puffs into the lungs in the morning and at bedtime. 10.7 g 11   Budeson-Glycopyrrol-Formoterol (BREZTRI AEROSPHERE) 160-9-4.8 MCG/ACT AERO Inhale 2 puffs into the lungs in the morning and at bedtime. 5.9 g 0   busPIRone (BUSPAR) 5 MG tablet TAKE ONE TABLET BY MOUTH TWICE A DAY AS NEEDED FOR ANXIETY 180 tablet 1   escitalopram (LEXAPRO) 20 MG tablet Take 1 tablet (20 mg total) by mouth daily. 90 tablet 3    furosemide (LASIX) 40 MG tablet Take 1 tablet (40 mg total) by mouth daily as needed for fluid or edema (or weight gain). 120 tablet 4   gabapentin (NEURONTIN) 400 MG capsule Take 1 capsule (400 mg total) by mouth 3 (three) times daily. 90 capsule 4   Multiple Vitamins-Minerals (MULTIVITAMINS THER. W/MINERALS) TABS Take 1 tablet by mouth daily.     predniSONE (DELTASONE) 10 MG tablet 4 tabs for 2 days, then 3 tabs for 2 days, 2 tabs for 2 days, then 1 tab for 2 days, then stop 20 tablet 0   Suvorexant (BELSOMRA) 5 MG TABS Take 5 mg by mouth at bedtime as needed (for sleep- NO screens 30 minutes before bed). 30 tablet 5   traMADol (ULTRAM) 50 MG tablet Take 1 tablet (50 mg total) by mouth every 6 (six) hours as needed (prefer max 3 per day). 90 tablet 3   amoxicillin-clavulanate (AUGMENTIN) 875-125 MG tablet Take 1 tablet by mouth 2 (two) times daily. (Patient not taking: Reported on 07/17/2022) 14 tablet 0   No facility-administered medications prior to visit.     Review of Systems:  Constitutional:   No  weight loss, night sweats,  Fevers, chills, + fatigue, or  lassitude.  HEENT:   No headaches,  Difficulty swallowing,  Tooth/dental problems, or  Sore throat,                No sneezing, itching, ear ache, nasal congestion, post nasal drip,   CV:  No chest pain,  Orthopnea, PND, swelling in lower extremities, anasarca, dizziness, palpitations, syncope.   GI  No heartburn, indigestion, abdominal pain, nausea, vomiting, diarrhea, change in bowel habits, loss of appetite, bloody stools.   Resp: .  No chest wall deformity  Skin: no rash or lesions.  GU: no dysuria, change in color of urine, no urgency or frequency.  No flank pain, no hematuria   MS:  No joint pain or swelling.  No decreased range of motion.  No back pain.    Physical Exam  BP 110/74 (BP Location: Left Arm, Patient Position: Sitting, Cuff Size: Large)   Pulse 94   Temp 97.6 F (36.4 C) (Oral)   Ht '5\' 11"'$  (1.803  m)   Wt 235 lb 9.6 oz (106.9 kg)   SpO2 91%   BMI 32.86 kg/m   GEN: A/Ox3; pleasant , NAD, chronically ill-appearing, elderly   HEENT:  [place]/AT,   NOSE-clear, THROAT-clear, no lesions, no postnasal drip or exudate noted.   NECK:  Supple w/ fair ROM; no JVD; normal carotid impulses w/o bruits; no thyromegaly or nodules palpated; no lymphadenopathy.    RESP bibasilar crackles no accessory muscle use, no dullness to percussion  CARD:  RRR, no m/r/g, no peripheral edema, pulses intact, no cyanosis or clubbing.  GI:   Soft & nt; nml bowel sounds; no organomegaly or masses detected.   Musco: Warm bil, no deformities or joint swelling noted.   Neuro: alert, no focal deficits noted.    Skin: Warm, no lesions or rashes    Lab Results:  CBC    Component Value Date/Time   WBC 13.7 (H) 06/08/2022 1325   RBC 5.05 06/08/2022 1325   HGB 15.0 06/08/2022 1325   HGB 16.6 07/12/2019 1509   HCT 44.2 06/08/2022 1325   HCT 49.6 07/12/2019 1509   PLT 413.0 (H) 06/08/2022 1325   PLT 383 07/12/2019 1509   MCV 87.5 06/08/2022 1325   MCV 91 07/12/2019 1509   MCH 30.4 07/12/2019 1509   MCH 31.0 05/29/2019 1628   MCHC 34.0 06/08/2022 1325   RDW 14.8 06/08/2022 1325   RDW 13.2 07/12/2019 1509   LYMPHSABS 1.0 06/08/2022 1325   MONOABS 0.7 06/08/2022 1325   EOSABS 0.0 06/08/2022 1325   BASOSABS 0.1 06/08/2022 1325    BMET    Component Value Date/Time   NA 134 (L) 06/08/2022 1325   NA 138 07/12/2019 1509   K 3.9 06/08/2022 1325   CL 99 06/08/2022 1325   CO2 29 06/08/2022 1325   GLUCOSE 120 (H) 06/08/2022 1325   BUN 19 06/08/2022 1325   BUN 15 07/12/2019 1509   CREATININE 0.97 06/08/2022 1325   CREATININE 1.16 11/27/2016 1555   CALCIUM 9.4 06/08/2022 1325   GFRNONAA 62 07/12/2019 1509   GFRAA 72 07/12/2019 1509    BNP    Component Value Date/Time   BNP 55.3 05/29/2019 1628    ProBNP    Component Value Date/Time   PROBNP 61.0 06/08/2022 1325    Imaging: No results  found.       Latest Ref Rng & Units 10/10/2019  12:00 PM 08/11/2017   11:00 AM  PFT Results  FVC-Pre L 1.43  2.20  P  FVC-Predicted Pre % 35  54  P  FVC-Post L 1.36  2.16  P  FVC-Predicted Post % 34  53  P  Pre FEV1/FVC % % 84  79  P  Post FEV1/FCV % % 88  84  P  FEV1-Pre L 1.20  1.74  P  FEV1-Predicted Pre % 41  59  P  FEV1-Post L 1.20  1.81  P  DLCO uncorrected ml/min/mmHg 14.73  20.88  P  DLCO UNC% % 61  67  P  DLCO corrected ml/min/mmHg 14.73    DLCO COR %Predicted % 61    DLVA Predicted % 129  132  P  TLC L 3.45  4.77  P  TLC % Predicted % 50  69  P  RV % Predicted % 71  96  P    P Preliminary result    No results found for: "NITRICOXIDE"      Assessment & Plan:   Chronic respiratory failure with hypoxia ([place]) Patient is encouraged on oxygen compliance.  Plan  Patient Instructions  Chest xray today  Mucinex DM Twice daily  As needed  Cough /congestion  Continue on BREZTRI 2 puffs Twice daily  -please take as directed.  Albuterol Inhaler 2 puffs every 4hrs as needed for wheezing .  May use Albuterol Neb every 4-6hr as needed.  Oxygen 2l/m 24/7 , wear all the time.  Low salt diet . Legs elevated .  Work on weight loss .  Advance activity as tolerated  Refer to pulmonary rehab  Follow up with Dr. Elsworth Soho  Or Jartavious Mckimmy NP in 3 months with Spirometry with DLCO  Please contact office for sooner follow up if symptoms do not improve or worsen or seek emergency care        Asthmatic bronchitis Recent exacerbation seems to be improved now.  Continue all current regimen.  Repeat chest x-ray today.  If continues to have ongoing interstitial markings.  Would recommend high-resolution CT chest.  Check spirometry with DLCO on return visit  Plan  Patient Instructions  Chest xray today  Mucinex DM Twice daily  As needed  Cough /congestion  Continue on BREZTRI 2 puffs Twice daily  -please take as directed.  Albuterol Inhaler 2 puffs every 4hrs as needed for wheezing .   May use Albuterol Neb every 4-6hr as needed.  Oxygen 2l/m 24/7 , wear all the time.  Low salt diet . Legs elevated .  Work on weight loss .  Advance activity as tolerated  Refer to pulmonary rehab  Follow up with Dr. Elsworth Soho  Or Joeleen Wortley NP in 3 months with Spirometry with DLCO  Please contact office for sooner follow up if symptoms do not improve or worsen or seek emergency care        (HFpEF) heart failure with preserved ejection fraction ([place]) Appears euvolemic on exam.  Continue current regimen  Pulmonary fibrosis, 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HCC) Fibrotic NS IP with previous COVID-19 infection.  Suspect patient is having progressive changes.  Autoimmune workup in the past was unrevealing.  Will check PFTs on return visit.  Consider repeat high-res going forward.  Plan  Patient Instructions  Chest xray today  Mucinex DM Twice daily  As needed  Cough /congestion  Continue on BREZTRI 2 puffs Twice daily  -please take as directed.  Albuterol Inhaler 2 puffs every 4hrs as needed for wheezing .  May  use Albuterol Neb every 4-6hr as needed.  Oxygen 2l/m 24/7 , wear all the time.  Low salt diet . Legs elevated .  Work on weight loss .  Advance activity as tolerated  Refer to pulmonary rehab  Follow up with Dr. Elsworth Soho  Or Austine Wiedeman NP in 3 months with Spirometry with DLCO  Please contact office for sooner follow up if symptoms do not improve or worsen or seek emergency care        Physical deconditioning Activity as tolerated.  Refer to pulmonary rehab     Rexene Edison, NP 07/17/2022

## 2022-07-17 NOTE — Assessment & Plan Note (Signed)
Recent exacerbation seems to be improved now.  Continue all current regimen.  Repeat chest x-ray today.  If continues to have ongoing interstitial markings.  Would recommend high-resolution CT chest.  Check spirometry with DLCO on return visit  Plan  Patient Instructions  Chest xray today  Mucinex DM Twice daily  As needed  Cough /congestion  Continue on BREZTRI 2 puffs Twice daily  -please take as directed.  Albuterol Inhaler 2 puffs every 4hrs as needed for wheezing .  May use Albuterol Neb every 4-6hr as needed.  Oxygen 2l/m 24/7 , wear all the time.  Low salt diet . Legs elevated .  Work on weight loss .  Advance activity as tolerated  Refer to pulmonary rehab  Follow up with Dr. Elsworth Soho  Or Istvan Behar NP in 3 months with Spirometry with DLCO  Please contact office for sooner follow up if symptoms do not improve or worsen or seek emergency care

## 2022-07-21 ENCOUNTER — Ambulatory Visit: Payer: Medicare Other | Admitting: Family Medicine

## 2022-07-23 ENCOUNTER — Ambulatory Visit: Payer: Medicare Other | Admitting: Adult Health

## 2022-07-28 NOTE — Progress Notes (Signed)
ATC x1, no answer.  LVM to return call.

## 2022-08-04 DIAGNOSIS — G4733 Obstructive sleep apnea (adult) (pediatric): Secondary | ICD-10-CM | POA: Diagnosis not present

## 2022-08-05 ENCOUNTER — Encounter: Payer: Self-pay | Admitting: *Deleted

## 2022-08-05 NOTE — Progress Notes (Signed)
ATC x2.  LVM to return call.  Unable to reach letter sent.

## 2022-08-07 NOTE — Progress Notes (Signed)
Closing encounter after multiple attempts to reach patient according to policy.

## 2022-08-19 ENCOUNTER — Encounter: Payer: Self-pay | Admitting: Pulmonary Disease

## 2022-08-19 ENCOUNTER — Ambulatory Visit: Payer: Medicare Other | Admitting: Pulmonary Disease

## 2022-08-19 VITALS — BP 116/54 | HR 82 | Temp 98.1°F | Ht 71.0 in | Wt 232.8 lb

## 2022-08-19 DIAGNOSIS — J4550 Severe persistent asthma, uncomplicated: Secondary | ICD-10-CM

## 2022-08-19 DIAGNOSIS — R5381 Other malaise: Secondary | ICD-10-CM | POA: Diagnosis not present

## 2022-08-19 DIAGNOSIS — J8410 Pulmonary fibrosis, unspecified: Secondary | ICD-10-CM | POA: Diagnosis not present

## 2022-08-19 DIAGNOSIS — I5032 Chronic diastolic (congestive) heart failure: Secondary | ICD-10-CM | POA: Diagnosis not present

## 2022-08-19 MED ORDER — PREDNISONE 10 MG PO TABS: ORAL_TABLET | ORAL | 0 refills | Status: AC

## 2022-08-19 NOTE — Patient Instructions (Signed)
Postinflammatory pulmonary fibrosis, progressive with mild flareup: Prednisone 20 mg daily x 7 days followed by 10 mg daily x 7 days Move up lung function testing to as soon as possible High-resolution CT scan now  Progressive dyspnea: CBC BMP  Chronic respiratory failure with hypoxemia: You need to wear oxygen all the time, not when you feel like it Use 4 L of oxygen at rest, 6 L on exertion  Follow-up in 3 to 4 weeks with Patricia Nettle, sooner if needed.

## 2022-08-19 NOTE — Progress Notes (Signed)
Synopsis: Patient of Dr. I will follow-up with eosinophilic asthma, postinflammatory fibrosis, history of COVID December 2020, history of A-fib  Subjective:   PATIENT ID: Wayne Booth GENDER: male DOB: 04-12-1941, MRN: [id number]   HPI  Chief Complaint  Patient presents with   Acute Visit    Wheezing, sob. Breathing started getting worse a couple weeks ago.     Wayne Booth is here to see [place] because he wants to go back into pulmonary rehab.  He went a few years ago, would like to try going to it again.  He says that he has been feeling worse lately.  He "feels good but he can't breathe".  He denies coughing, fever, chills.  No leg swelling, no chest pain.    His wife says that he just started feeling worse in the last few weeks.  He's losing weight, not gaining it.  No aches or pains.  Record review: January 2024 office visit with Tammy  Reviewed where the patient was seen in the context of follow-up for asthmatic bronchitis, postinflammatory pneumonia and heart failure repeat PFTs ordered for 3 months  Past Medical History:  Diagnosis Date   Allergy    States his nose runs when he is around grease.    Anxiety    Arthritis    Atrial fibrillation (HCC)    Cancer (HCC)    Coronary artery disease    History of total knee replacement    Hypertension    Hypothyroidism    Kidney cancer, primary, with metastasis from kidney to other site [facility])    Kidney stone    OSA (obstructive sleep apnea)    pt does not wear cpap at night   Pituitary cyst ([place])    Pneumonia    hx of   Prostate cancer ([place])      Family History  Problem Relation Age of Onset   Cancer Father        ?mouth, died before patient born   Cancer Mother        stomach, died when he was young   Kidney cancer Sister    Colon cancer Brother 70   Esophageal cancer Neg Hx    Rectal cancer Neg Hx    Stomach cancer Neg Hx      Social History   Socioeconomic History   Marital status: Married    Spouse  name: [name]   Number of children: Y   Years of education: Not on file   Highest education level: Not on file  Occupational History   Occupation: retired Armed forces operational officer: RETIRED  Tobacco Use   Smoking status: Never   Smokeless tobacco: Never  Vaping Use   Vaping Use: Never used  Substance and Sexual Activity   Alcohol use: Yes    Alcohol/week: 2.0 standard drinks of alcohol    Types: 2 Shots of liquor per week    Comment: socially   Drug use: No   Sexual activity: Not on file  Other Topics Concern   Not on file  Social History Narrative   Married 1965 (wife patient of Dr. Megan Salon). 2 sons. 4 granddaughters.       Retired Chemical engineer.       Hobbies: golf, fishing, formerly hunting.    Social Determinants of Health   Financial Resource Strain: Not on file  Food Insecurity: No Food Insecurity (02/25/2022)   Hunger Vital Sign    Worried About Running Out of Food in the  Last Year: Never true    [place] in the Last Year: Never true  Transportation Needs: No Transportation Needs (02/25/2022)   PRAPARE - Hydrologist (Medical): No    Lack of Transportation (Non-Medical): No  Physical Activity: Not on file  Stress: Not on file  Social Connections: Not on file  Intimate Partner Violence: Not on file     Allergies  Allergen Reactions   Ambien [Zolpidem] Anxiety    Anxiety and agitation when tried as sleeper in hospital aug 2019     Outpatient Medications Prior to Visit  Medication Sig Dispense Refill   albuterol (PROVENTIL) (2.5 MG/3ML) 0.083% nebulizer solution Take 3 mLs (2.5 mg total) by nebulization every 6 (six) hours as needed for wheezing or shortness of breath. 75 mL 3   albuterol (VENTOLIN HFA) 108 (90 Base) MCG/ACT inhaler Inhale 2 puffs into the lungs every 6 (six) hours as needed for wheezing or shortness of breath. 18 g 2   apixaban (ELIQUIS) 5 MG TABS tablet Take 1 tablet (5 mg total) by  mouth 2 (two) times daily. 60 tablet 10   Budeson-Glycopyrrol-Formoterol (BREZTRI AEROSPHERE) 160-9-4.8 MCG/ACT AERO Inhale 2 puffs into the lungs in the morning and at bedtime. 10.7 g 11   Budeson-Glycopyrrol-Formoterol (BREZTRI AEROSPHERE) 160-9-4.8 MCG/ACT AERO Inhale 2 puffs into the lungs in the morning and at bedtime. 5.9 g 0   busPIRone (BUSPAR) 5 MG tablet TAKE ONE TABLET BY MOUTH TWICE A DAY AS NEEDED FOR ANXIETY 180 tablet 1   escitalopram (LEXAPRO) 20 MG tablet Take 1 tablet (20 mg total) by mouth daily. 90 tablet 3   furosemide (LASIX) 40 MG tablet Take 1 tablet (40 mg total) by mouth daily as needed for fluid or edema (or weight gain). 120 tablet 4   gabapentin (NEURONTIN) 400 MG capsule Take 1 capsule (400 mg total) by mouth 3 (three) times daily. 90 capsule 4   Multiple Vitamins-Minerals (MULTIVITAMINS THER. W/MINERALS) TABS Take 1 tablet by mouth daily.     Suvorexant (BELSOMRA) 5 MG TABS Take 5 mg by mouth at bedtime as needed (for sleep- NO screens 30 minutes before bed). 30 tablet 5   traMADol (ULTRAM) 50 MG tablet Take 1 tablet (50 mg total) by mouth every 6 (six) hours as needed (prefer max 3 per day). 90 tablet 3   predniSONE (DELTASONE) 10 MG tablet 4 tabs for 2 days, then 3 tabs for 2 days, 2 tabs for 2 days, then 1 tab for 2 days, then stop (Patient not taking: Reported on 08/19/2022) 20 tablet 0   No facility-administered medications prior to visit.    Review of Systems  Constitutional:  Negative for chills, fever, malaise/fatigue and weight loss.  HENT:  Negative for congestion, sinus pain and sore throat.   Respiratory:  Positive for shortness of breath. Negative for cough and sputum production.   Cardiovascular:  Negative for chest pain and leg swelling.      Objective:  Physical Exam   Vitals:   08/19/22 1459  BP: (!) 116/54  Pulse: 82  Temp: 98.1 F (36.7 C)  SpO2: 93%  Weight: 232 lb 12.8 oz (105.6 kg)  Height: 5' 11"$  (1.803 m)   O2 saturation was  72% on RA when he came in (not wearing oxygen)  Gen: chronically ill appearing HENT: OP clear, neck supple PULM: Coarse crackles bases to 1/2 way up, normal effort  CV: RRR, no mgr GI: BS+, soft, nontender Derm:  no cyanosis or rash, no edema Psyche: normal mood and affect   CBC    Component Value Date/Time   WBC 13.7 (H) 06/08/2022 1325   RBC 5.05 06/08/2022 1325   HGB 15.0 06/08/2022 1325   HGB 16.6 07/12/2019 1509   HCT 44.2 06/08/2022 1325   HCT 49.6 07/12/2019 1509   PLT 413.0 (H) 06/08/2022 1325   PLT 383 07/12/2019 1509   MCV 87.5 06/08/2022 1325   MCV 91 07/12/2019 1509   MCH 30.4 07/12/2019 1509   MCH 31.0 05/29/2019 1628   MCHC 34.0 06/08/2022 1325   RDW 14.8 06/08/2022 1325   RDW 13.2 07/12/2019 1509   LYMPHSABS 1.0 06/08/2022 1325   MONOABS 0.7 06/08/2022 1325   EOSABS 0.0 06/08/2022 1325   BASOSABS 0.1 06/08/2022 1325     TEST/EVENTS :  CT chest June 08, 2017 showed a small left lower lobe area of consolidation.     PFT 07/2017 moderate restriction, DLCO 67%   CT chest October 2019 small to moderate bilateral pleural effusions consolidations within the left greater than right lower lobe and lingula.  Hazy density in the left greater than right lower lobe.   2D echo February 04, 2018 showed EF 50 to 55%   12/2018 HRCT Chest -patchy parenchymal groundglass with interstitial coarsening bilaterally similar to 2019 from a multi lobar pneumonia felt to be a component of postinflammatory changes not consistent with UIP.   07/2019 Eosinophils 100>700>800  IGE  367  ANA, neg CCP , ANCA neg . RA 16.7    Sleep study September 18, 2019 showed moderate sleep apnea with AHI at 26/hour SPO2 low at 82%   PFT shows a decline in lung function with FEV1 at 41%, ratio 88, FVC 34%, no significant bronchodilator response, DLCO 61%.  (Previous DLCO 67%, FEV1 61%-2019)   High-resolution CT chest January 16, 2021 fibrotic interstitial lung disease, without frank honeycombing mildly  progressed since July 2021.  Mild basilar predominance and groundglass predominance.  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      Assessment & Plan:   Pulmonary fibrosis, 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HCC)  Severe persistent asthmatic bronchitis without complication  Chronic heart failure with preserved ejection fraction [facility])  Physical deconditioning  Discussion: 82 year old male with severe postinflammatory pulmonary fibrosis and chronic respiratory failure with hypoxemia, asthmatic bronchitis presents with progressive dyspnea.  He says that he is deconditioned and wants to go back to pulmonary rehab.  However, it does seem that he needs more oxygen than he has required recently.  In his most recent visit with [place] we suggested a repeat high-res CT and lung function test.  I think we need to go ahead and get those sooner rather than later to see if the fibrosis has progressed (which is very likely).  Further complicating matters is the fact that he does not wear his oxygen consistently and is likely developing worsening pulmonary hypertension as a consequence of that.  I doubt that he is having an ILD flare but I think there is little harm in treating for 1 right now.  Plan: Postinflammatory pulmonary fibrosis, progressive with mild flareup: Prednisone 20 mg daily x 7 days followed by 10 mg daily x 7 days Move up lung function testing to as soon as possible High-resolution CT scan now  Progressive dyspnea: CBC BMP Pulmonary rehab referral  Chronic respiratory failure with hypoxemia: You need to wear oxygen all the time, not when you feel like  it Use 4 L of oxygen at rest, 6 L on exertion  Follow-up in 3 to 4 weeks with [facility], sooner if needed.  Immunizations: Immunization History  Administered Date(s) Administered   Fluad Quad(high Dose 65+) 03/16/2019, 05/14/2020, 06/10/2021, 04/13/2022   Influenza Split 04/01/2011,  03/29/2012   Influenza Whole 04/12/2007, 03/18/2009, 03/04/2010   Influenza, High Dose Seasonal PF 04/03/2014, 04/04/2015, 04/24/2016, 04/12/2017, 04/26/2018   Influenza,inj,Quad PF,6+ Mos 03/22/2013   Influenza-Unspecified 05/29/2017   PFIZER(Purple Top)SARS-COV-2 Vaccination 08/19/2019, 09/11/2019, 06/09/2020   PNEUMOCOCCAL CONJUGATE-20 12/26/2020   Pneumococcal Conjugate-13 11/12/2014   Pneumococcal Polysaccharide-23 06/09/2011   Pneumococcal-Unspecified 06/30/2015   Td 06/29/1994   Tdap 06/09/2011   Zoster Recombinat (Shingrix) 03/16/2019, 07/19/2019   Zoster, Live 09/22/2007     Current Outpatient Medications:    albuterol (PROVENTIL) (2.5 MG/3ML) 0.083% nebulizer solution, Take 3 mLs (2.5 mg total) by nebulization every 6 (six) hours as needed for wheezing or shortness of breath., Disp: 75 mL, Rfl: 3   albuterol (VENTOLIN HFA) 108 (90 Base) MCG/ACT inhaler, Inhale 2 puffs into the lungs every 6 (six) hours as needed for wheezing or shortness of breath., Disp: 18 g, Rfl: 2   apixaban (ELIQUIS) 5 MG TABS tablet, Take 1 tablet (5 mg total) by mouth 2 (two) times daily., Disp: 60 tablet, Rfl: 10   Budeson-Glycopyrrol-Formoterol (BREZTRI AEROSPHERE) 160-9-4.8 MCG/ACT AERO, Inhale 2 puffs into the lungs in the morning and at bedtime., Disp: 10.7 g, Rfl: 11   Budeson-Glycopyrrol-Formoterol (BREZTRI AEROSPHERE) 160-9-4.8 MCG/ACT AERO, Inhale 2 puffs into the lungs in the morning and at bedtime., Disp: 5.9 g, Rfl: 0   busPIRone (BUSPAR) 5 MG tablet, TAKE ONE TABLET BY MOUTH TWICE A DAY AS NEEDED FOR ANXIETY, Disp: 180 tablet, Rfl: 1   escitalopram (LEXAPRO) 20 MG tablet, Take 1 tablet (20 mg total) by mouth daily., Disp: 90 tablet, Rfl: 3   furosemide (LASIX) 40 MG tablet, Take 1 tablet (40 mg total) by mouth daily as needed for fluid or edema (or weight gain)., Disp: 120 tablet, Rfl: 4   gabapentin (NEURONTIN) 400 MG capsule, Take 1 capsule (400 mg total) by mouth 3 (three) times daily.,  Disp: 90 capsule, Rfl: 4   Multiple Vitamins-Minerals (MULTIVITAMINS THER. W/MINERALS) TABS, Take 1 tablet by mouth daily., Disp: , Rfl:    predniSONE (DELTASONE) 10 MG tablet, Take 2 tablets (20 mg total) by mouth daily with breakfast for 7 days, THEN 1 tablet (10 mg total) daily with breakfast for 7 days., Disp: 20 tablet, Rfl: 0   Suvorexant (BELSOMRA) 5 MG TABS, Take 5 mg by mouth at bedtime as needed (for sleep- NO screens 30 minutes before bed)., Disp: 30 tablet, Rfl: 5   traMADol (ULTRAM) 50 MG tablet, Take 1 tablet (50 mg total) by mouth every 6 (six) hours as needed (prefer max 3 per day)., Disp: 90 tablet, Rfl: 3   predniSONE (DELTASONE) 10 MG tablet, 4 tabs for 2 days, then 3 tabs for 2 days, 2 tabs for 2 days, then 1 tab for 2 days, then stop (Patient not taking: Reported on 08/19/2022), Disp: 20 tablet, Rfl: 0

## 2022-08-20 LAB — BASIC METABOLIC PANEL
BUN: 13 mg/dL (ref 6–23)
CO2: 33 mEq/L — ABNORMAL HIGH (ref 19–32)
Calcium: 9.5 mg/dL (ref 8.4–10.5)
Chloride: 99 mEq/L (ref 96–112)
Creatinine, Ser: 1 mg/dL (ref 0.40–1.50)
GFR: 71.26 mL/min (ref >60.00)
Glucose, Bld: 88 mg/dL (ref 70–99)
Potassium: 4.1 mEq/L (ref 3.5–5.1)
Sodium: 136 mEq/L (ref 135–145)

## 2022-08-20 LAB — CBC
HCT: 46.5 % (ref 39.0–52.0)
Hemoglobin: 15.6 g/dL (ref 13.0–17.0)
MCHC: 33.5 g/dL (ref 30.0–36.0)
MCV: 89.2 fl (ref 78.0–100.0)
Platelets: 346 10*3/uL (ref 150.0–400.0)
RBC: 5.22 Mil/uL (ref 4.22–5.81)
RDW: 15.4 % (ref 11.5–15.5)
WBC: 8.2 10*3/uL (ref 4.0–10.5)

## 2022-08-31 ENCOUNTER — Ambulatory Visit (INDEPENDENT_AMBULATORY_CARE_PROVIDER_SITE_OTHER): Payer: Medicare Other | Admitting: Pulmonary Disease

## 2022-08-31 DIAGNOSIS — J8410 Pulmonary fibrosis, unspecified: Secondary | ICD-10-CM | POA: Diagnosis not present

## 2022-08-31 LAB — PULMONARY FUNCTION TEST
FEF 25-75 Pre: 1.5 L/sec
FEF2575-%Pred-Pre: 79 %
FEV1-%Pred-Pre: 42 %
FEV1-Pre: 1.17 L
FEV1FVC-%Pred-Pre: 119 %
FEV6-%Pred-Pre: 38 %
FEV6-Pre: 1.37 L
FEV6FVC-%Pred-Pre: 107 %
FVC-%Pred-Pre: 35 %
FVC-Pre: 1.37 L
Pre FEV1/FVC ratio: 85 %
Pre FEV6/FVC Ratio: 100 %

## 2022-08-31 NOTE — Patient Instructions (Signed)
Spiro/DLCO performed today.  

## 2022-09-02 ENCOUNTER — Ambulatory Visit (HOSPITAL_BASED_OUTPATIENT_CLINIC_OR_DEPARTMENT_OTHER): Admission: RE | Admit: 2022-09-02 | Payer: Medicare Other | Source: Ambulatory Visit

## 2022-09-02 DIAGNOSIS — G4733 Obstructive sleep apnea (adult) (pediatric): Secondary | ICD-10-CM | POA: Diagnosis not present

## 2022-09-13 ENCOUNTER — Ambulatory Visit (HOSPITAL_BASED_OUTPATIENT_CLINIC_OR_DEPARTMENT_OTHER): Payer: Medicare Other

## 2022-09-16 ENCOUNTER — Ambulatory Visit (HOSPITAL_BASED_OUTPATIENT_CLINIC_OR_DEPARTMENT_OTHER)
Admission: RE | Admit: 2022-09-16 | Discharge: 2022-09-16 | Disposition: A | Discharge status: Home / Self Care | Payer: Medicare Other | Source: Ambulatory Visit | Attending: Pulmonary Disease | Admitting: Pulmonary Disease

## 2022-09-16 ENCOUNTER — Encounter (HOSPITAL_BASED_OUTPATIENT_CLINIC_OR_DEPARTMENT_OTHER): Payer: Self-pay

## 2022-09-16 DIAGNOSIS — J8410 Pulmonary fibrosis, unspecified: Secondary | ICD-10-CM | POA: Diagnosis not present

## 2022-09-16 DIAGNOSIS — J479 Bronchiectasis, uncomplicated: Secondary | ICD-10-CM | POA: Diagnosis not present

## 2022-09-16 DIAGNOSIS — J90 Pleural effusion, not elsewhere classified: Secondary | ICD-10-CM | POA: Diagnosis not present

## 2022-09-21 ENCOUNTER — Telehealth: Payer: Self-pay | Admitting: Adult Health

## 2022-09-21 NOTE — Telephone Encounter (Signed)
Patient would like results of CT scan. Patient phone number is [phone].

## 2022-09-22 NOTE — Telephone Encounter (Signed)
Spoke with the pt's son  He is requesting that we call the pt with his CT results  Pt is anxious to received these results and does not want to wait until planned appt with Tammy on 09/29/22  He will keep this appt, but would like to know about the results before hand  Tammy, please advise, thanks!      IMPRESSION: 1. Slight interval worsening of moderate to severe pulmonary fibrosis in a pattern with apical to basal gradient, featuring extensive areas of peripheral and peribronchovascular interstitial opacity, septal thickening, and extensive associated ground-glass with mild associated varicoid and traction bronchiectasis. Appearance generally favors NSIP, and is not in the chronic organizing pneumonia pattern characteristic for chronic sequelae of COVID airspace disease. There is probably a component of superimposed pulmonary edema, which may exaggerate appearance of interstitial lung disease. Findings are suggestive of an alternative diagnosis (not UIP) per consensus guidelines: Diagnosis of Idiopathic Pulmonary Fibrosis: An Official ATS/ERS/JRS/ALAT Clinical Practice Guideline. [place] [address], Iss 5, [phone], Feb 27 2017. 2. Similar small, chronic, loculated left pleural effusion and a new trace right pleural effusion. 3. Unchanged prominent mediastinal and hilar lymph nodes, likely reactive to fibrosis. 4. Coronary artery disease. 5. Aortic valve calcifications. Correlate for echocardiographic evidence of aortic valve dysfunction.   Aortic Atherosclerosis (ICD10-I70.0).     Electronically Signed   By: Delanna Ahmadi M.D.      On: 09/18/2022 07:47

## 2022-09-24 NOTE — Telephone Encounter (Signed)
Called and left patient message on machine.  I will be out of the office for the rest of the week.  He has an appointment with me next week we can discuss it in detail at that point. The CT does show some moderate to severe pulmonary fibrosis with some slight interval worsening.  We can discuss what we need to do about that at his visit next week there is not much we can do until I see him in the office.  If he has acute symptoms see if he can be worked in with another provider or will need to seek emergency room care  Please contact office for sooner follow up if symptoms do not improve or worsen or seek emergency care

## 2022-09-28 NOTE — Telephone Encounter (Signed)
Spoke with pt and reviewed CT results as dictated by Tammy and plans to go over results in detail in OV on 09/29/22. Pt stated understanding and confirmed he will come to [place] on 09/29/22. Nothing further needed at this time.

## 2022-09-29 ENCOUNTER — Ambulatory Visit: Payer: Medicare Other | Admitting: Adult Health

## 2022-10-06 ENCOUNTER — Encounter: Payer: Self-pay | Admitting: Podiatry

## 2022-10-06 ENCOUNTER — Ambulatory Visit: Payer: Medicare Other | Admitting: Podiatry

## 2022-10-06 DIAGNOSIS — M79675 Pain in left toe(s): Secondary | ICD-10-CM

## 2022-10-06 DIAGNOSIS — M79674 Pain in right toe(s): Secondary | ICD-10-CM

## 2022-10-06 DIAGNOSIS — G629 Polyneuropathy, unspecified: Secondary | ICD-10-CM

## 2022-10-06 DIAGNOSIS — B351 Tinea unguium: Secondary | ICD-10-CM | POA: Diagnosis not present

## 2022-10-07 NOTE — Progress Notes (Signed)
This patient returns to my office for at risk foot care.  This patient requires this care by a professional since this patient will be at risk due to having  CKD  and chronic anticoagulation. Patient is taking eliquis.This patient is unable to cut nails himself since the patient cannot reach his nails.These nails are painful walking and wearing shoes.  He presents to the office with his wife.  He has not been seen for over a year.  This patient presents for at risk foot care today.  General Appearance  Alert, conversant and in no acute stress.  Vascular  Dorsalis pedis and posterior tibial  pulses are  weakly palpable  bilaterally.  Capillary return is within normal limits  bilaterally. Temperature is within normal limits  bilaterally.  Neurologic  Senn-Weinstein monofilament wire test within normal limits  bilaterally. Muscle power within normal limits bilaterally.  Nails Thick disfigured discolored nails with subungual debris  from hallux to fifth toes bilaterally. No evidence of bacterial infection or drainage bilaterally.  Orthopedic  No limitations of motion  feet .  No crepitus or effusions noted.  No bony pathology or digital deformities noted.  Skin  normotropic skin with no porokeratosis noted bilaterally.  No signs of infections or ulcers noted.     Onychomycosis  Pain in right toes  Pain in left toes  Consent was obtained for treatment procedures.   Mechanical debridement of nails 1-5  bilaterally performed with a nail nipper.  Filed with dremel without incident. Cauterized second toe left foot.   Return office visit    3 months                  Told patient to return for periodic foot care and evaluation due to potential at risk complications.   Helane Gunther DPM

## 2022-10-12 ENCOUNTER — Encounter: Payer: Self-pay | Admitting: Adult Health

## 2022-10-12 ENCOUNTER — Ambulatory Visit: Payer: Medicare Other | Admitting: Adult Health

## 2022-10-12 VITALS — BP 106/64 | HR 72 | Temp 97.7°F | Ht 71.0 in | Wt 236.0 lb

## 2022-10-12 DIAGNOSIS — J4550 Severe persistent asthma, uncomplicated: Secondary | ICD-10-CM | POA: Diagnosis not present

## 2022-10-12 DIAGNOSIS — J9611 Chronic respiratory failure with hypoxia: Secondary | ICD-10-CM | POA: Diagnosis not present

## 2022-10-12 DIAGNOSIS — J8410 Pulmonary fibrosis, unspecified: Secondary | ICD-10-CM | POA: Diagnosis not present

## 2022-10-12 DIAGNOSIS — I509 Heart failure, unspecified: Secondary | ICD-10-CM | POA: Diagnosis not present

## 2022-10-12 DIAGNOSIS — I2782 Chronic pulmonary embolism: Secondary | ICD-10-CM

## 2022-10-12 DIAGNOSIS — I5032 Chronic diastolic (congestive) heart failure: Secondary | ICD-10-CM | POA: Diagnosis not present

## 2022-10-12 DIAGNOSIS — H1032 Unspecified acute conjunctivitis, left eye: Secondary | ICD-10-CM | POA: Diagnosis not present

## 2022-10-12 DIAGNOSIS — H109 Unspecified conjunctivitis: Secondary | ICD-10-CM | POA: Insufficient documentation

## 2022-10-12 DIAGNOSIS — R5381 Other malaise: Secondary | ICD-10-CM

## 2022-10-12 DIAGNOSIS — R531 Weakness: Secondary | ICD-10-CM | POA: Diagnosis not present

## 2022-10-12 MED ORDER — POLYMYXIN B-TRIMETHOPRIM 10000-0.1 UNIT/ML-% OP SOLN: 1.0000 [drp] | Freq: Four times a day (QID) | OPHTHALMIC | 0 refills | Status: AC

## 2022-10-12 MED ORDER — ALBUTEROL SULFATE (2.5 MG/3ML) 0.083% IN NEBU: 2.5000 mg | INHALATION_SOLUTION | Freq: Four times a day (QID) | RESPIRATORY_TRACT | 3 refills | Status: AC | PRN

## 2022-10-12 MED ORDER — FUROSEMIDE 40 MG PO TABS: 40.0000 mg | ORAL_TABLET | Freq: Every day | ORAL | 0 refills | Status: DC | PRN

## 2022-10-12 MED ORDER — PREDNISONE 10 MG PO TABS: ORAL_TABLET | ORAL | 0 refills | Status: DC

## 2022-10-12 MED ORDER — ALBUTEROL SULFATE HFA 108 (90 BASE) MCG/ACT IN AERS: 2.0000 | INHALATION_SPRAY | Freq: Four times a day (QID) | RESPIRATORY_TRACT | 2 refills | Status: DC | PRN

## 2022-10-12 NOTE — Assessment & Plan Note (Signed)
Chronic asthmatic bronchitis with allergic component.  Continue on current maintenance regimen.

## 2022-10-12 NOTE — Assessment & Plan Note (Signed)
Left eye conjunctivitis.  Warm compresses.  Polytrim eyedrops.  If symptoms or not improving will need to see eye doctor.

## 2022-10-12 NOTE — Patient Instructions (Addendum)
2 D Echo  Labs today.  Prednisone 20mg  daily for 1 week , then 10mg  daily for 1 week and stop  Lasix 40mg  daily for 3 days then As needed   Continue on BREZTRI 2 puffs Twice daily  -please take as directed.  Albuterol Inhaler 2 puffs every 4hrs as needed for wheezing .  May use Albuterol Neb every 4-6hr as needed.  Oxygen 2l/m Low salt diet . Legs elevated .  Work on weight loss .  Advance activity as tolerated  Order Home PT .  Follow up with Dr. Vassie Loll  Or Saintclair Schroader NP in 3-4 weeks.  Please contact office for sooner follow up if symptoms do not improve or worsen or seek emergency care

## 2022-10-12 NOTE — Progress Notes (Signed)
  ID: [name], male    DOB: [birth year], 82 y.o.   MRN: [id number]  Chief Complaint  Patient presents with   Follow-up    Review PFT from March 2024     Referring provider: Shelva Majestic, MD  HPI: 82 year old male with minimal smoking history followed for asthmatic bronchitis with allergic component, recurrent pneumonia, obstructive sleep apnea (declined CPAP), postinflammatory fibrosis, chronic respiratory failure on oxygen, pulmonary hypertension COVID-19 infection October 2020 requiring hospitalization treated with remdesivir and Decadron Previous history of PE in 2018 on lifelong anticoagulation therapy Medical history significant for pituitary tumor removal, atrial flutter, renal cell carcinoma status post nephrectomy and diastolic heart failure   TEST/EVENTS :  CT chest June 08, 2017 showed a small left lower lobe area of consolidation.     PFT 07/2017 moderate restriction, DLCO 67%   CT chest October 2019 small to moderate bilateral pleural effusions consolidations within the left greater than right lower lobe and lingula.  Hazy density in the left greater than right lower lobe.   2D echo February 04, 2018 showed EF 50 to 55%   12/2018 HRCT Chest -patchy parenchymal groundglass with interstitial coarsening bilaterally similar to 2019 from a multi lobar pneumonia felt to be a component of postinflammatory changes not consistent with UIP.   07/2019 Eosinophils 100>700>800  IGE  367  2019 ANA (neg)  , neg CCP , ANCA neg . RA 16.7  2022 ANA and RA positive -referral to Rheumatology   Sleep study September 18, 2019 showed moderate sleep apnea with AHI at 26/hour SPO2 low at 82%   PFT shows a decline in lung function with FEV1 at 41%, ratio 88, FVC 34%, no significant bronchodilator response, DLCO 61%.  (Previous DLCO 67%, FEV1 61%-2019)   High-resolution CT chest January 16, 2021 fibrotic interstitial lung disease, without frank honeycombing mildly progressed  since July 2021.  Mild basilar predominance and groundglass predominance.  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  R/L Heart Cath , No sign CAD , Mild Pulmonary HTN,   10/12/2022 Follow up ; asthma, postinflammatory fibrosis, chronic respiratory failure Patient presents for a 51-month follow-up.  Patient was seen last visit with progressive shortness of breath and decreased activity tolerance.  Patient has underlying severe persistent asthma with allergic phenotype.  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  Felt possibly secondary to COVID-19. PFTs done on August 31, 2022 shows moderate restriction with FEV1 of 52%, ratio 85, FVC 35%, DLCO was not completed.  FVC is similar to 2021. High-resolution CT chest September 16, 2022 showed slight interval worsening of moderate to severe pulmonary fibrosis with apical to basal gradient.  Generally favors NSIP, not UIP.  Similar small chronic loculated left pleural effusion and new trace right pleural effusion questionable superimposed pulmonary edema. We discussed his test results in detail.  Patient complains of intermittent cough that is mainly productive.  Gets short of breath with minimum activity.  Remains on oxygen 24/7.  Patient is on 2 L of oxygen.  Wife says that she does turn up to 4 L when he needs it.  Today in the office he came in without oxygen O2 saturations were 94% on room air. Complains of left eye redness and irritation of lower lid. No visual changes. No injury .  Worked in [employer] social research officer years ago. 1 dog. No birds/chickens, No hottubs or basements. No feather pillows or quilts/beddings. From [place] . No [employer]. No  recent travel. No amiodarone or Methotrexate. No autoimmune history . No FH of autoimmune.     Allergies  Allergen Reactions   Ambien [Zolpidem] Anxiety    Anxiety and agitation when tried as sleeper in hospital aug 2019    Immunization History   Administered Date(s) Administered   Fluad Quad(high Dose 65+) 03/16/2019, 05/14/2020, 06/10/2021, 04/13/2022   Influenza Split 04/01/2011, 03/29/2012   Influenza Whole 04/12/2007, 03/18/2009, 03/04/2010   Influenza, High Dose Seasonal PF 04/03/2014, 04/04/2015, 04/24/2016, 04/12/2017, 04/26/2018   Influenza,inj,Quad PF,6+ Mos 03/22/2013   Influenza-Unspecified 05/29/2017   PFIZER(Purple Top)SARS-COV-2 Vaccination 08/19/2019, 09/11/2019, 06/09/2020   PNEUMOCOCCAL CONJUGATE-20 12/26/2020   Pneumococcal Conjugate-13 11/12/2014   Pneumococcal Polysaccharide-23 06/09/2011   Pneumococcal-Unspecified 06/30/2015   Td 06/29/1994   Tdap 06/09/2011   Zoster Recombinat (Shingrix) 03/16/2019, 07/19/2019   Zoster, Live 09/22/2007    Past Medical History:  Diagnosis Date   Allergy    States his nose runs when he is around grease.    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 60 tablet 10   Budeson-Glycopyrrol-Formoterol (BREZTRI AEROSPHERE) 160-9-4.8 MCG/ACT AERO Inhale 2 puffs into the lungs in the morning and at bedtime. 10.7 g 11   busPIRone (BUSPAR) 5 MG tablet TAKE ONE TABLET BY MOUTH TWICE A DAY AS NEEDED FOR ANXIETY 180 tablet 1   escitalopram (LEXAPRO) 20 MG tablet Take 1 tablet (20 mg total) by mouth daily. 90 tablet 3   gabapentin (NEURONTIN) 400 MG capsule  Take 1 capsule (400 mg total) by mouth 3 (three) times daily. 90 capsule 4   Multiple Vitamins-Minerals (MULTIVITAMINS THER. W/MINERALS) TABS Take 1 tablet by mouth daily.     Suvorexant (BELSOMRA) 5 MG TABS Take 5 mg by mouth at bedtime as needed (for sleep- NO screens 30 minutes before bed). 30 tablet 5   traMADol (ULTRAM) 50 MG tablet Take 1 tablet (50 mg total) by mouth every 6 (six) hours as needed (prefer max 3 per day). 90 tablet 3   albuterol (PROVENTIL) (2.5 MG/3ML) 0.083% nebulizer solution Take 3 mLs (2.5 mg total) by nebulization every 6 (six) hours as needed for wheezing or shortness of breath. 75 mL 3   albuterol (VENTOLIN HFA) 108 (90 Base) MCG/ACT inhaler Inhale 2 puffs into the lungs every 6 (six) hours as needed for wheezing or shortness of breath. 18 g 2   furosemide (LASIX) 40 MG tablet Take 1 tablet (40 mg total) by mouth daily as needed for fluid or edema (or weight gain). 120 tablet 4   Budeson-Glycopyrrol-Formoterol (BREZTRI AEROSPHERE) 160-9-4.8 MCG/ACT AERO Inhale 2 puffs into the lungs in the morning and at bedtime. (Patient not taking: Reported on 10/12/2022) 5.9 g 0  predniSONE (DELTASONE) 10 MG tablet 4 tabs for 2 days, then 3 tabs for 2 days, 2 tabs for 2 days, then 1 tab for 2 days, then stop (Patient not taking: Reported on 08/19/2022) 20 tablet 0   No facility-administered medications prior to visit.     Review of Systems:   Constitutional:   No  weight loss, night sweats,  Fevers, chills, + fatigue, or  lassitude.  HEENT:   No headaches,  Difficulty swallowing,  Tooth/dental problems, or  Sore throat,                No sneezing, itching, ear ache, nasal congestion, post nasal drip,   CV:  No chest pain,  Orthopnea, PND, swelling in lower extremities, anasarca, dizziness, palpitations, syncope.   GI  No heartburn, indigestion, abdominal pain, nausea, vomiting, diarrhea, change in bowel habits, loss of appetite, bloody stools.   Resp:   No chest wall  deformity  Skin: no rash or lesions.  GU: no dysuria, change in color of urine, no urgency or frequency.  No flank pain, no hematuria   MS:  No joint pain or swelling.  No decreased range of motion.  No back pain.    Physical Exam  BP 106/64 (BP Location: Right Arm, Patient Position: Sitting, Cuff Size: Large)   Pulse 72   Temp 97.7 F (36.5 C) (Oral)   Ht 5\' 11"  (1.803 m)   Wt 236 lb (107 kg)   SpO2 92%   BMI 32.92 kg/m   GEN: A/Ox3; pleasant , NAD, elderly , O2 , WC    HEENT:  [place]/AT,   NOSE-clear, THROAT-clear, no lesions, no postnasal drip or exudate noted.  Left lower eye lid mild redness with yellow scant discharge.  NECK:  Supple w/ fair ROM; no JVD; normal carotid impulses w/o bruits; no thyromegaly or nodules palpated; no lymphadenopathy.    RESP  BB crackles  no accessory muscle use, no dullness to percussion  CARD:  RRR, no m/r/g, no peripheral edema, pulses intact, no cyanosis or clubbing.  GI:   Soft & nt; nml bowel sounds; no organomegaly or masses detected.   Musco: Warm bil, no deformities or joint swelling noted.   Neuro: alert, no focal deficits noted.    Skin: Warm, no lesions or rashes    Lab Results:  CBC    Component Value Date/Time   WBC 8.2 08/19/2022 1601   RBC 5.22 08/19/2022 1601   HGB 15.6 08/19/2022 1601   HGB 16.6 07/12/2019 1509   HCT 46.5 08/19/2022 1601   HCT 49.6 07/12/2019 1509   PLT 346.0 08/19/2022 1601   PLT 383 07/12/2019 1509   MCV 89.2 08/19/2022 1601   MCV 91 07/12/2019 1509   MCH 30.4 07/12/2019 1509   MCH 31.0 05/29/2019 1628   MCHC 33.5 08/19/2022 1601   RDW 15.4 08/19/2022 1601   RDW 13.2 07/12/2019 1509   LYMPHSABS 1.0 06/08/2022 1325   MONOABS 0.7 06/08/2022 1325   EOSABS 0.0 06/08/2022 1325   BASOSABS 0.1 06/08/2022 1325    BMET    Component Value Date/Time   NA 136 08/19/2022 1601   NA 138 07/12/2019 1509   K 4.1 08/19/2022 1601   CL 99 08/19/2022 1601   CO2 33 (H) 08/19/2022 1601   GLUCOSE  88 08/19/2022 1601   BUN 13 08/19/2022 1601   BUN 15 07/12/2019 1509   CREATININE 1.00 08/19/2022 1601   CREATININE 1.16 11/27/2016 1555   CALCIUM 9.5 08/19/2022 1601  GFRNONAA 62 07/12/2019 1509   GFRAA 72 07/12/2019 1509    BNP    Component Value Date/Time   BNP 55.3 05/29/2019 1628    ProBNP    Component Value Date/Time   PROBNP 61.0 06/08/2022 1325    Imaging: CT Chest High Resolution  Result Date: 09/18/2022 CLINICAL DATA:  Pulmonary fibrosis, history of COVID with hospitalization EXAM: CT CHEST WITHOUT CONTRAST TECHNIQUE: Multidetector CT imaging of the chest was performed following the standard protocol without intravenous contrast. High resolution imaging of the lungs, as well as inspiratory and expiratory imaging, was performed. RADIATION DOSE REDUCTION: This exam was performed according to the departmental dose-optimization program which includes automated exposure control, adjustment of the mA and/or kV according to patient size and/or use of iterative reconstruction technique. COMPARISON:  01/16/2021, 01/08/2020, 09/06/2019 FINDINGS: Cardiovascular: Aortic atherosclerosis. Aortic valve calcifications. Normal heart size. Left coronary artery calcifications. No pericardial effusion. Mediastinum/Nodes: Unchanged prominent mediastinal and hilar lymph nodes. Thyroid gland, trachea, and esophagus demonstrate no significant findings. Lungs/Pleura: Slight interval worsening of moderate to severe pulmonary fibrosis in a pattern with apical to basal gradient, featuring extensive areas of peripheral and peribronchovascular interstitial opacity, septal thickening, and extensive associated ground-glass with mild associated varicoid and traction bronchiectasis. Similar small, chronic, loculated left pleural effusion and a new trace right pleural effusion. No significant air trapping on expiratory phase imaging Upper Abdomen: No acute abnormality.  Status post cholecystectomy. Musculoskeletal:  No chest wall abnormality. No acute osseous findings. Disc degenerative disease and extensive bridging osteophytosis throughout the thoracic spine, in keeping with DISH. IMPRESSION: 1. Slight interval worsening of moderate to severe pulmonary fibrosis in a pattern with apical to basal gradient, featuring extensive areas of peripheral and peribronchovascular interstitial opacity, septal thickening, and extensive associated ground-glass with mild associated varicoid and traction bronchiectasis. Appearance generally favors NSIP, and is not in the chronic organizing pneumonia pattern characteristic for chronic sequelae of COVID airspace disease. There is probably a component of superimposed pulmonary edema, which may exaggerate appearance of interstitial lung disease. Findings are suggestive of an alternative diagnosis (not UIP) per consensus guidelines: Diagnosis of Idiopathic Pulmonary Fibrosis: An Official ATS/ERS/JRS/ALAT Clinical Practice Guideline. Am Rosezetta Schlatter Crit Care Med Vol 198, Iss 5, [phone], Feb 27 2017. 2. Similar small, chronic, loculated left pleural effusion and a new trace right pleural effusion. 3. Unchanged prominent mediastinal and hilar lymph nodes, likely reactive to fibrosis. 4. Coronary artery disease. 5. Aortic valve calcifications. Correlate for echocardiographic evidence of aortic valve dysfunction. Aortic Atherosclerosis (ICD10-I70.0). Electronically Signed   By: Jearld Lesch M.D.   On: 09/18/2022 07:47         Latest Ref Rng & Units 08/31/2022   11:42 AM 10/10/2019   12:00 PM 08/11/2017   11:00 AM  PFT Results  FVC-Pre L 1.37  1.43  2.20  P  FVC-Predicted Pre % 35  35  54  P  FVC-Post L  1.36  2.16  P  FVC-Predicted Post %  34  53  P  Pre FEV1/FVC % % 85  84  79  P  Post FEV1/FCV % %  88  84  P  FEV1-Pre L 1.17  1.20  1.74  P  FEV1-Predicted Pre % 42  41  59  P  FEV1-Post L  1.20  1.81  P  DLCO uncorrected ml/min/mmHg  14.73  20.88  P  DLCO UNC% %  61  67  P  DLCO  corrected ml/min/mmHg  14.73    DLCO  COR %Predicted %  61    DLVA Predicted %  129  132  P  TLC L  3.45  4.77  P  TLC % Predicted %  50  69  P  RV % Predicted %  71  96  P    P Preliminary result    No results found for: "NITRICOXIDE"      Assessment & Plan:   Pulmonary fibrosis, 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HCC) Patient with history of recurrent pneumonia, postinflammatory fibrosis-serial CT scans show progressive NSIP favor progressive fibrotic NSIP as he has continued clinical decline.  Worsening restrictive lung disease.  May have some component of superimposed volume overload with diastolic dysfunction. Labs today with sed rate and hypersensitivity pneumonitis panel.  Patient has had previous autoimmune testing which showed negative ANA and minimally elevated rheumatoid factor with a negative CCP.  Repeat testing did show  positive ANA and rheumatoid factor with referral to rheumatology.-Will have to clarify with patient if he ever was seen by rheumatology. Check 2D echo.  He also has known mild pulmonary hypertension complicated by chronic lung disease and untreated sleep apnea. We discussed antifibrotic therapy today.  Will wait labs and echo report and discuss further on follow-up visit in 3 weeks We will treat with a short course of prednisone 20 mg daily for 1 week and then 10 mg daily 1 week  Plan  Patient Instructions  2 D Echo  Labs today.  Prednisone 20mg  daily for 1 week , then 10mg  daily for 1 week and stop  Lasix 40mg  daily for 3 days then As needed   Continue on BREZTRI 2 puffs Twice daily  -please take as directed.  Albuterol Inhaler 2 puffs every 4hrs as needed for wheezing .  May use Albuterol Neb every 4-6hr as needed.  Oxygen 2l/m Low salt diet . Legs elevated .  Work on weight loss .  Advance activity as tolerated  Order Home PT .  Follow up with Dr. Vassie Loll  Or Daeshaun Specht NP in 3-4 weeks.  Please contact office for sooner follow up if symptoms do not improve or worsen  or seek emergency care       (HFpEF) heart failure with preserved ejection fraction (HCC) Recent CT scan shows trace effusion with questionable superimposed edema.  Patient is encouraged to use his Lasix-will use for the next 3 days and then as needed.  Check 2D echo  Chronic pulmonary embolism (HCC) Continue on Eliquis.  Chronic respiratory failure with hypoxia (HCC) Continue on oxygen to maintain O2 saturations greater than 88 to 90%.  Asthmatic bronchitis Chronic asthmatic bronchitis with allergic component.  Continue on current maintenance regimen.  Physical deconditioning Patient has had progressive weakness and decreased activity tolerance.  Order for home physical therapy-he has ongoing balance issues abnormal gait and decreased strength.  Conjunctivitis Left eye conjunctivitis.  Warm compresses.  Polytrim eyedrops.  If symptoms or not improving will need to see eye doctor.    I spent   50 minutes dedicated to the care of this patient on the date of this encounter to include pre-visit review of records, face-to-face time with the patient discussing conditions above, post visit ordering of testing, clinical documentation with the electronic health record, making appropriate referrals as documented, and communicating necessary findings to members of the patients care team.   Rubye Oaks, NP 10/12/2022

## 2022-10-12 NOTE — Assessment & Plan Note (Signed)
Continue on oxygen to maintain O2 saturations greater than 88 to 90%. 

## 2022-10-12 NOTE — Assessment & Plan Note (Addendum)
Patient with history of recurrent pneumonia, postinflammatory fibrosis-serial CT scans show progressive NSIP favor progressive fibrotic NSIP as he has continued clinical decline.  Worsening restrictive lung disease.  May have some component of superimposed volume overload with diastolic dysfunction. Labs today with sed rate and hypersensitivity pneumonitis panel.  Patient has had previous autoimmune testing which showed negative ANA and minimally elevated rheumatoid factor with a negative CCP.  Repeat testing did show  positive ANA and rheumatoid factor with referral to rheumatology.-Will have to clarify with patient if he ever was seen by rheumatology. Check 2D echo.  He also has known mild pulmonary hypertension complicated by chronic lung disease and untreated sleep apnea. We discussed antifibrotic therapy today.  Will wait labs and echo report and discuss further on follow-up visit in 3 weeks We will treat with a short course of prednisone 20 mg daily for 1 week and then 10 mg daily 1 week  Plan  Patient Instructions  2 D Echo  Labs today.  Prednisone 20mg  daily for 1 week , then 10mg  daily for 1 week and stop  Lasix 40mg  daily for 3 days then As needed   Continue on BREZTRI 2 puffs Twice daily  -please take as directed.  Albuterol Inhaler 2 puffs every 4hrs as needed for wheezing .  May use Albuterol Neb every 4-6hr as needed.  Oxygen 2l/m Low salt diet . Legs elevated .  Work on weight loss .  Advance activity as tolerated  Order Home PT .  Follow up with Dr. Vassie Loll  Or Alliya Marcon NP in 3-4 weeks.  Please contact office for sooner follow up if symptoms do not improve or worsen or seek emergency care

## 2022-10-12 NOTE — Assessment & Plan Note (Signed)
Recent CT scan shows trace effusion with questionable superimposed edema.  Patient is encouraged to use his Lasix-will use for the next 3 days and then as needed.  Check 2D echo

## 2022-10-12 NOTE — Assessment & Plan Note (Signed)
Patient has had progressive weakness and decreased activity tolerance.  Order for home physical therapy-he has ongoing balance issues abnormal gait and decreased strength.

## 2022-10-12 NOTE — Assessment & Plan Note (Signed)
Continue on Eliquis 

## 2022-10-13 ENCOUNTER — Ambulatory Visit: Payer: Medicare Other | Admitting: Adult Health

## 2022-10-13 LAB — IGE: IgE (Immunoglobulin E), Serum: 666 kU/L — ABNORMAL HIGH (ref <=114)

## 2022-10-13 LAB — SEDIMENTATION RATE: Sed Rate: 30 mm/hr — ABNORMAL HIGH (ref 0–20)

## 2022-10-16 ENCOUNTER — Telehealth: Payer: Self-pay | Admitting: Adult Health

## 2022-10-16 ENCOUNTER — Telehealth: Payer: Self-pay | Admitting: *Deleted

## 2022-10-16 NOTE — Telephone Encounter (Signed)
Tammy just an FYI Patient wanted you to know Home Health will be starting on Tuesday.

## 2022-10-16 NOTE — Telephone Encounter (Signed)
Scheduled patient first available with Dr. Vassie Loll on June 3rd at [facility]. Dr. Vassie Loll, do you want to work him in before June 3rd.  Please advise.  Thank you.

## 2022-10-16 NOTE — Progress Notes (Signed)
Called and spoke with patient, scheduled for first available with Dr. Vassie Loll at [facility] (June 3rd at 1:15 pm, to arrive by 1:00 pm).  Advised I would send a message to Dr. Vassie Loll to see if he would like to work him in sooner.  Prednisone  script sent into pharmacy after verifying pharmacy.  Nothing further needed.

## 2022-10-16 NOTE — Telephone Encounter (Signed)
Ms. Wayne Booth sent a referral for home health. They are calling and wanted her to know PT will have service started on this coming Tue per his own request. Thank you.

## 2022-10-17 LAB — HYPERSENSITIVITY PNEUMONITIS
A. Pullulans Abs: NEGATIVE
A.Fumigatus #1 Abs: NEGATIVE
Micropolyspora faeni, IgG: NEGATIVE
Pigeon Serum Abs: NEGATIVE
Thermoact. Saccharii: NEGATIVE
Thermoactinomyces vulgaris, IgG: NEGATIVE

## 2022-10-19 ENCOUNTER — Ambulatory Visit: Payer: Medicare Other | Admitting: Adult Health

## 2022-10-19 ENCOUNTER — Other Ambulatory Visit: Payer: Self-pay | Admitting: *Deleted

## 2022-10-19 ENCOUNTER — Telehealth: Payer: Self-pay | Admitting: Adult Health

## 2022-10-19 DIAGNOSIS — R0609 Other forms of dyspnea: Secondary | ICD-10-CM

## 2022-10-19 DIAGNOSIS — J8410 Pulmonary fibrosis, unspecified: Secondary | ICD-10-CM

## 2022-10-19 MED ORDER — PREDNISONE 10 MG PO TABS: 10.0000 mg | ORAL_TABLET | Freq: Every day | ORAL | 1 refills | Status: DC

## 2022-10-19 NOTE — Telephone Encounter (Signed)
Pt's spouse Augusto Gamble called the office requesting to have a call from Tammy so she can discuss pt with her.  Augusto Gamble is asking for Tammy to call her personally so she can discuss what is going on. Jody would not elaborate over the phone.  Augusto Gamble can be reached at [phone]. Tammy, please advise.

## 2022-10-20 ENCOUNTER — Ambulatory Visit: Payer: Medicare Other | Admitting: Adult Health

## 2022-10-20 NOTE — Telephone Encounter (Signed)
[facility] called to let Rubye Oaks, NP know that patient requested that they come tomorrow instead of today.

## 2022-10-21 ENCOUNTER — Telehealth (HOSPITAL_BASED_OUTPATIENT_CLINIC_OR_DEPARTMENT_OTHER): Payer: Self-pay | Admitting: Pulmonary Disease

## 2022-10-21 DIAGNOSIS — I5030 Unspecified diastolic (congestive) heart failure: Secondary | ICD-10-CM | POA: Diagnosis not present

## 2022-10-21 DIAGNOSIS — U099 Post covid-19 condition, unspecified: Secondary | ICD-10-CM | POA: Diagnosis not present

## 2022-10-21 DIAGNOSIS — I110 Hypertensive heart disease with heart failure: Secondary | ICD-10-CM | POA: Diagnosis not present

## 2022-10-21 DIAGNOSIS — J8410 Pulmonary fibrosis, unspecified: Secondary | ICD-10-CM | POA: Diagnosis not present

## 2022-10-21 DIAGNOSIS — I4891 Unspecified atrial fibrillation: Secondary | ICD-10-CM | POA: Diagnosis not present

## 2022-10-21 DIAGNOSIS — I700 Atherosclerosis of aorta: Secondary | ICD-10-CM | POA: Diagnosis not present

## 2022-10-21 NOTE — Telephone Encounter (Signed)
Called and spoke with the PT. Provided her with the verbals for home health PT, she verbalized understanding.   Nothing further needed at time of call.

## 2022-10-21 NOTE — Telephone Encounter (Signed)
Called the number provided. No one answered. Left message for them to call back.

## 2022-10-21 NOTE — Telephone Encounter (Signed)
[facility] calling to get verbal orders for Home health PT with a frequency of 1st 1 week 1 and 2X 3 weeks and then 1 for 3week please advise

## 2022-10-22 NOTE — Telephone Encounter (Signed)
Discussed with wife , concerns that he is slowly getting worse and going down hill with increased dyspnea and decreased activity tolerance.   Can we check to see when his echo is.  On return need labs for ANA and RA , CCP . ( If positive will need referral to Rheumatology -did not go as prev recommended in 2022.)  Can we start OFEV paperwork for him Progressive Fibrotic NSIP

## 2022-10-22 NOTE — Telephone Encounter (Signed)
Echo scheduled for 5/1.  Called and spoke with patient and Wife (DPR), advised I had scheduled him to see Dr. Vassie Loll on 4/29 at 10:30 am, to arrive by 10:15 am for check in at [facility] location.  Advised that the lab work could be drawn at [facility] while they are there and depending on the outcome of the labs, we may refer him to rheumatology.  Ofev paperwork will need to be started at his appointment at [facility] as he needs to sign the paperwork.  Labs entered.  She verbalized understanding.  Nothing further needed.  Cleopatra Cedar, can you start the paperwork for Ofev for this patient at this appointment on 4/29?  Thank you.  I have already entered the labs to be drawn at [facility].

## 2022-10-26 ENCOUNTER — Ambulatory Visit (HOSPITAL_BASED_OUTPATIENT_CLINIC_OR_DEPARTMENT_OTHER): Payer: Medicare Other | Admitting: Pulmonary Disease

## 2022-10-26 ENCOUNTER — Encounter (HOSPITAL_BASED_OUTPATIENT_CLINIC_OR_DEPARTMENT_OTHER): Payer: Self-pay | Admitting: Pulmonary Disease

## 2022-10-26 VITALS — BP 104/68 | HR 73 | Temp 97.7°F | Ht 71.0 in | Wt 236.8 lb

## 2022-10-26 DIAGNOSIS — J9611 Chronic respiratory failure with hypoxia: Secondary | ICD-10-CM

## 2022-10-26 DIAGNOSIS — J849 Interstitial pulmonary disease, unspecified: Secondary | ICD-10-CM | POA: Diagnosis not present

## 2022-10-26 DIAGNOSIS — Z515 Encounter for palliative care: Secondary | ICD-10-CM

## 2022-10-26 MED ORDER — ALPRAZOLAM 0.5 MG PO TABS: 0.5000 mg | ORAL_TABLET | Freq: Every evening | ORAL | 1 refills | Status: DC | PRN

## 2022-10-26 NOTE — Assessment & Plan Note (Addendum)
Disease progression was discussed. He is now reliant on oxygen. Will consult pulmonary rehab in the future if he improves We discussed advanced directives and he clearly expresses that he would not desire mechanical ventilation or CPR.  If he has further disease progression, involve palliative care He desires selective to help him sleep especially given his severe anxiety over disease progression and we will provide him low-dose 0.5 Xanax to use as needed#30

## 2022-10-26 NOTE — Assessment & Plan Note (Signed)
This appears to be a progressive phenotype, radiologically favor fibrotic NSIP but no evidence of classic vascular disease.  It is more likely to be atypical IPF although it does not have honeycombing to suggest UIP.  There is a definite apical to basal gradient. As such we would have to start antifibrotic.  Paperwork filled out for Ofev, risks and benefits were discussed, side effect profile was discussed in detail.  LFTs will be obtained. We will undertake a therapeutic trial of prednisone, he has been taking this for about 10 days and has not achieved any symptomatic benefit.  Will continue 10 mg for another 3 to 4 weeks while he is able to obtain ofev from the pharmacy

## 2022-10-26 NOTE — Patient Instructions (Addendum)
  X start Ofev - we discussed side effects  X Rx for xanax 0.5 mg qhs as needed for anxiety # 30  Continue prednisone 10 mg daily

## 2022-10-26 NOTE — Progress Notes (Signed)
Subjective:    Patient ID: Wayne Booth, male    DOB: December 31, 1940, 82 y.o.   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 He had COVID-19 infection October 2020 requiring hospitalization treated with remdesivir and Decadron.  His breathing has gradually worsened over the last 2 years.  HRCT and PFTs were reviewed. He arrives accompanied by his wife in a wheelchair.  He is using 4 L continuous all the time.  Previously he only used oxygen as needed and now appears to be more dependent . He was given a course of prednisone last visit, he started at 40 mg and is down to 10 mg.  Has not noted significant improvement in breathing. He denies skin rash or joint issues or muscle pain   Significant tests/ events reviewed  PFT 08/2022 moderate restriction with FEV1 of 52%, ratio 85, FVC 35%, DLCO was not completed. FVC is similar to 2021.   08/2019 PFT shows a decline in lung function with FEV1 at 41%, ratio 88, FVC 34%, no significant bronchodilator response, DLCO 61%.  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 Generally favors NSIP, not UIP.   High-resolution CT chest January 16, 2021 fibrotic interstitial lung disease, without frank honeycombing mildly progressed since July 2021.  Mild basilar predominance and groundglass predominance.   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   CT chest October 2019 small to moderate bilateral pleural effusions consolidations within the left greater than right lower lobe and lingula.  Hazy density in the left greater than right lower lobe.   12/2018 HRCT Chest -patchy parenchymal groundglass with interstitial coarsening bilaterally similar to 2019 from a multi lobar pneumonia felt to be a component of postinflammatory changes not consistent with UIP.   07/2019 Eosinophils 100>700>800  IGE  367  2019 ANA (neg)  , neg CCP , ANCA neg . RA 16.7  2022 ANA and RA positive -referral to Rheumatology 09/2022 HSP neg   Sleep study September 18, 2019 showed moderate sleep apnea with AHI at 26/hour SPO2 low at 82%   06/2019 R/L Heart Cath , No sign CAD , Mild Pulmonary HTN,   Review of Systems neg for any significant sore throat, dysphagia, itching, sneezing, nasal congestion or excess/ purulent secretions, fever, chills, sweats, unintended wt loss, pleuritic or exertional cp, hempoptysis, orthopnea pnd or change in chronic leg swelling. Also denies presyncope, palpitations, heartburn, abdominal pain, nausea, vomiting, diarrhea or change in bowel or urinary habits, dysuria,hematuria, rash, arthralgias, visual complaints, headache, numbness weakness or ataxia.     Objective:   Physical Exam  Gen. Pleasant, obese, in no distress ENT - no lesions, no post nasal drip Neck: No JVD, no thyromegaly, no carotid bruits Lungs: no use of accessory muscles, no dullness to percussion, bibasal half dry crackles Cardiovascular: Rhythm regular, heart sounds  normal, no murmurs or gallops, no  peripheral edema Musculoskeletal: No deformities, no cyanosis or clubbing , no tremors       Assessment & Plan:

## 2022-10-27 ENCOUNTER — Telehealth: Payer: Self-pay

## 2022-10-27 DIAGNOSIS — J8410 Pulmonary fibrosis, unspecified: Secondary | ICD-10-CM | POA: Diagnosis not present

## 2022-10-27 DIAGNOSIS — I700 Atherosclerosis of aorta: Secondary | ICD-10-CM | POA: Diagnosis not present

## 2022-10-27 DIAGNOSIS — U099 Post covid-19 condition, unspecified: Secondary | ICD-10-CM | POA: Diagnosis not present

## 2022-10-27 DIAGNOSIS — I110 Hypertensive heart disease with heart failure: Secondary | ICD-10-CM | POA: Diagnosis not present

## 2022-10-27 DIAGNOSIS — I5030 Unspecified diastolic (congestive) heart failure: Secondary | ICD-10-CM | POA: Diagnosis not present

## 2022-10-27 DIAGNOSIS — I4891 Unspecified atrial fibrillation: Secondary | ICD-10-CM | POA: Diagnosis not present

## 2022-10-27 NOTE — Telephone Encounter (Signed)
(  4:44 pm) PC SW attempted to contact patient to no avail regarding the palliative care referral received. SW left a message requesting a call back for patient and his wife-Jody.

## 2022-10-28 ENCOUNTER — Inpatient Hospital Stay (HOSPITAL_COMMUNITY): Admission: RE | Admit: 2022-10-28 | Payer: Medicare Other | Source: Ambulatory Visit

## 2022-11-03 DIAGNOSIS — I700 Atherosclerosis of aorta: Secondary | ICD-10-CM | POA: Diagnosis not present

## 2022-11-03 DIAGNOSIS — I110 Hypertensive heart disease with heart failure: Secondary | ICD-10-CM | POA: Diagnosis not present

## 2022-11-03 DIAGNOSIS — I5030 Unspecified diastolic (congestive) heart failure: Secondary | ICD-10-CM | POA: Diagnosis not present

## 2022-11-03 DIAGNOSIS — J8410 Pulmonary fibrosis, unspecified: Secondary | ICD-10-CM | POA: Diagnosis not present

## 2022-11-03 DIAGNOSIS — U099 Post covid-19 condition, unspecified: Secondary | ICD-10-CM | POA: Diagnosis not present

## 2022-11-03 DIAGNOSIS — I4891 Unspecified atrial fibrillation: Secondary | ICD-10-CM | POA: Diagnosis not present

## 2022-11-04 ENCOUNTER — Telehealth: Payer: Self-pay

## 2022-11-04 NOTE — Telephone Encounter (Signed)
(  1:32 pm)PC SW left a message for patient and his wife requesting a call back to discuss palliative care referral.

## 2022-11-05 ENCOUNTER — Encounter: Payer: Medicare Other | Admitting: Family Medicine

## 2022-11-05 DIAGNOSIS — I110 Hypertensive heart disease with heart failure: Secondary | ICD-10-CM | POA: Diagnosis not present

## 2022-11-05 DIAGNOSIS — J8410 Pulmonary fibrosis, unspecified: Secondary | ICD-10-CM | POA: Diagnosis not present

## 2022-11-05 DIAGNOSIS — I700 Atherosclerosis of aorta: Secondary | ICD-10-CM | POA: Diagnosis not present

## 2022-11-05 DIAGNOSIS — I4891 Unspecified atrial fibrillation: Secondary | ICD-10-CM | POA: Diagnosis not present

## 2022-11-05 DIAGNOSIS — U099 Post covid-19 condition, unspecified: Secondary | ICD-10-CM | POA: Diagnosis not present

## 2022-11-05 DIAGNOSIS — I5030 Unspecified diastolic (congestive) heart failure: Secondary | ICD-10-CM | POA: Diagnosis not present

## 2022-11-05 NOTE — Telephone Encounter (Signed)
(  4:49 pm) PC SW left a voice message for patient and his wife requesting a call back. SW advised patient/wife on message that if no contact is received in the next couple of days, patient will be moved to inactive list.

## 2022-11-09 DIAGNOSIS — I700 Atherosclerosis of aorta: Secondary | ICD-10-CM | POA: Diagnosis not present

## 2022-11-09 DIAGNOSIS — U099 Post covid-19 condition, unspecified: Secondary | ICD-10-CM | POA: Diagnosis not present

## 2022-11-09 DIAGNOSIS — I110 Hypertensive heart disease with heart failure: Secondary | ICD-10-CM | POA: Diagnosis not present

## 2022-11-09 DIAGNOSIS — I5030 Unspecified diastolic (congestive) heart failure: Secondary | ICD-10-CM | POA: Diagnosis not present

## 2022-11-09 DIAGNOSIS — I4891 Unspecified atrial fibrillation: Secondary | ICD-10-CM | POA: Diagnosis not present

## 2022-11-09 DIAGNOSIS — J8410 Pulmonary fibrosis, unspecified: Secondary | ICD-10-CM | POA: Diagnosis not present

## 2022-11-12 DIAGNOSIS — I110 Hypertensive heart disease with heart failure: Secondary | ICD-10-CM | POA: Diagnosis not present

## 2022-11-12 DIAGNOSIS — U099 Post covid-19 condition, unspecified: Secondary | ICD-10-CM | POA: Diagnosis not present

## 2022-11-12 DIAGNOSIS — I4891 Unspecified atrial fibrillation: Secondary | ICD-10-CM | POA: Diagnosis not present

## 2022-11-12 DIAGNOSIS — J8410 Pulmonary fibrosis, unspecified: Secondary | ICD-10-CM | POA: Diagnosis not present

## 2022-11-12 DIAGNOSIS — I700 Atherosclerosis of aorta: Secondary | ICD-10-CM | POA: Diagnosis not present

## 2022-11-12 DIAGNOSIS — I5030 Unspecified diastolic (congestive) heart failure: Secondary | ICD-10-CM | POA: Diagnosis not present

## 2022-11-20 DIAGNOSIS — U099 Post covid-19 condition, unspecified: Secondary | ICD-10-CM | POA: Diagnosis not present

## 2022-11-20 DIAGNOSIS — I110 Hypertensive heart disease with heart failure: Secondary | ICD-10-CM | POA: Diagnosis not present

## 2022-11-20 DIAGNOSIS — I4891 Unspecified atrial fibrillation: Secondary | ICD-10-CM | POA: Diagnosis not present

## 2022-11-20 DIAGNOSIS — I700 Atherosclerosis of aorta: Secondary | ICD-10-CM | POA: Diagnosis not present

## 2022-11-20 DIAGNOSIS — I5030 Unspecified diastolic (congestive) heart failure: Secondary | ICD-10-CM | POA: Diagnosis not present

## 2022-11-20 DIAGNOSIS — J8410 Pulmonary fibrosis, unspecified: Secondary | ICD-10-CM | POA: Diagnosis not present

## 2022-11-24 ENCOUNTER — Telehealth: Payer: Self-pay | Admitting: Pulmonary Disease

## 2022-11-24 NOTE — Telephone Encounter (Signed)
Wayne Booth needs to extend physical therapy. 1 a week for 3 weeks. Wayne Booth phone number is [phone].

## 2022-11-25 ENCOUNTER — Inpatient Hospital Stay (HOSPITAL_COMMUNITY): Admission: RE | Admit: 2022-11-25 | Payer: Medicare Other | Source: Ambulatory Visit

## 2022-11-25 NOTE — Telephone Encounter (Signed)
Monique calling again asking for an update regarding verbal confirmation for extension of PT. Please advise and call back.

## 2022-11-25 NOTE — Telephone Encounter (Signed)
Dr. Vassie Loll, please advise if you are okay with the verbal.

## 2022-11-26 NOTE — Telephone Encounter (Signed)
Called and spoke with [facility] letting her know that Dr. Vassie Loll stated it was okay to extend pt's PT and she verbalized understanding. Nothing further needed.

## 2022-11-27 DIAGNOSIS — I5030 Unspecified diastolic (congestive) heart failure: Secondary | ICD-10-CM | POA: Diagnosis not present

## 2022-11-27 DIAGNOSIS — U099 Post covid-19 condition, unspecified: Secondary | ICD-10-CM | POA: Diagnosis not present

## 2022-11-27 DIAGNOSIS — I110 Hypertensive heart disease with heart failure: Secondary | ICD-10-CM | POA: Diagnosis not present

## 2022-11-27 DIAGNOSIS — I700 Atherosclerosis of aorta: Secondary | ICD-10-CM | POA: Diagnosis not present

## 2022-11-27 DIAGNOSIS — I4891 Unspecified atrial fibrillation: Secondary | ICD-10-CM | POA: Diagnosis not present

## 2022-11-27 DIAGNOSIS — J8410 Pulmonary fibrosis, unspecified: Secondary | ICD-10-CM | POA: Diagnosis not present

## 2022-11-30 ENCOUNTER — Ambulatory Visit (HOSPITAL_BASED_OUTPATIENT_CLINIC_OR_DEPARTMENT_OTHER): Payer: Medicare Other | Admitting: Pulmonary Disease

## 2022-12-02 DIAGNOSIS — J8410 Pulmonary fibrosis, unspecified: Secondary | ICD-10-CM | POA: Diagnosis not present

## 2022-12-02 DIAGNOSIS — I700 Atherosclerosis of aorta: Secondary | ICD-10-CM | POA: Diagnosis not present

## 2022-12-02 DIAGNOSIS — I5030 Unspecified diastolic (congestive) heart failure: Secondary | ICD-10-CM | POA: Diagnosis not present

## 2022-12-02 DIAGNOSIS — U099 Post covid-19 condition, unspecified: Secondary | ICD-10-CM | POA: Diagnosis not present

## 2022-12-02 DIAGNOSIS — I4891 Unspecified atrial fibrillation: Secondary | ICD-10-CM | POA: Diagnosis not present

## 2022-12-02 DIAGNOSIS — I110 Hypertensive heart disease with heart failure: Secondary | ICD-10-CM | POA: Diagnosis not present

## 2022-12-08 DIAGNOSIS — I110 Hypertensive heart disease with heart failure: Secondary | ICD-10-CM | POA: Diagnosis not present

## 2022-12-08 DIAGNOSIS — U099 Post covid-19 condition, unspecified: Secondary | ICD-10-CM | POA: Diagnosis not present

## 2022-12-08 DIAGNOSIS — I4891 Unspecified atrial fibrillation: Secondary | ICD-10-CM | POA: Diagnosis not present

## 2022-12-08 DIAGNOSIS — I5030 Unspecified diastolic (congestive) heart failure: Secondary | ICD-10-CM | POA: Diagnosis not present

## 2022-12-08 DIAGNOSIS — I700 Atherosclerosis of aorta: Secondary | ICD-10-CM | POA: Diagnosis not present

## 2022-12-08 DIAGNOSIS — J8410 Pulmonary fibrosis, unspecified: Secondary | ICD-10-CM | POA: Diagnosis not present

## 2022-12-09 ENCOUNTER — Telehealth: Payer: Self-pay | Admitting: Pulmonary Disease

## 2022-12-10 ENCOUNTER — Telehealth: Payer: Self-pay | Admitting: Family Medicine

## 2022-12-10 NOTE — Telephone Encounter (Signed)
In November I recommended 1 month follow-up-he is overdue-would recommend discussing at next available office visit

## 2022-12-10 NOTE — Telephone Encounter (Signed)
Trazadone not on current med list, ok to fill?

## 2022-12-10 NOTE — Telephone Encounter (Signed)
  Wayne Booth states needs verbal order to continue physical therapy. Monique phone number is [phone].

## 2022-12-10 NOTE — Telephone Encounter (Signed)
Prescription Request  12/10/2022  LOV: 04/13/2022  What is the name of the medication or equipment? Trazodone 50 mg --Patient stated he is NOT taking the xanax prescribed on 10/26/22 by Cyril Mourning  Have you contacted your pharmacy to request a refill? Yes   Which pharmacy would you like this sent to?  [facility] [id number] [name], [place] - [address] [place] [address] Phone: [phone] Fax: [phone]      Patient notified that their request is being sent to the clinical staff for review and that they should receive a response within 2 business days.   Please advise at Mobile [phone] (mobile)

## 2022-12-11 NOTE — Telephone Encounter (Signed)
Please see below regarding scheduling.

## 2022-12-16 NOTE — Telephone Encounter (Signed)
We ordered PT for the pt in April 2024   I called Monique and advised ok to continue with this   Nothing further needed

## 2022-12-17 DIAGNOSIS — I5030 Unspecified diastolic (congestive) heart failure: Secondary | ICD-10-CM | POA: Diagnosis not present

## 2022-12-17 DIAGNOSIS — I700 Atherosclerosis of aorta: Secondary | ICD-10-CM | POA: Diagnosis not present

## 2022-12-17 DIAGNOSIS — U099 Post covid-19 condition, unspecified: Secondary | ICD-10-CM | POA: Diagnosis not present

## 2022-12-17 DIAGNOSIS — I110 Hypertensive heart disease with heart failure: Secondary | ICD-10-CM | POA: Diagnosis not present

## 2022-12-17 DIAGNOSIS — J8410 Pulmonary fibrosis, unspecified: Secondary | ICD-10-CM | POA: Diagnosis not present

## 2022-12-17 DIAGNOSIS — I4891 Unspecified atrial fibrillation: Secondary | ICD-10-CM | POA: Diagnosis not present

## 2022-12-21 ENCOUNTER — Ambulatory Visit (HOSPITAL_BASED_OUTPATIENT_CLINIC_OR_DEPARTMENT_OTHER): Payer: Medicare Other | Admitting: Pulmonary Disease

## 2022-12-21 ENCOUNTER — Telehealth: Payer: Self-pay | Admitting: Family Medicine

## 2022-12-21 NOTE — Telephone Encounter (Signed)
Home Health Verbal Orders  Agency:  [facility]  Caller:  Liji  Contact and title  Requesting OT/ PT/ Skilled nursing/ Social Work/ Speech:    Reason for Request:  Was seen for home therapy - need to extend the therapy because of the mourning of his dog - 2 time a week for 2 weeks, then 1 week 4 weeks - for PT  Frequency:    HH needs F2F w/in last 30 days      [phone]

## 2022-12-22 NOTE — Telephone Encounter (Signed)
Called and lm on Liji vm with VO. 

## 2022-12-22 NOTE — Telephone Encounter (Signed)
HH called again and would like a call back with verbal orders. Please advise.

## 2022-12-25 ENCOUNTER — Encounter: Payer: Self-pay | Admitting: Family Medicine

## 2022-12-25 ENCOUNTER — Ambulatory Visit (INDEPENDENT_AMBULATORY_CARE_PROVIDER_SITE_OTHER): Payer: Medicare Other | Admitting: Family Medicine

## 2022-12-25 VITALS — BP 120/72 | HR 78 | Temp 98.4°F | Ht 71.0 in | Wt 231.4 lb

## 2022-12-25 DIAGNOSIS — I5032 Chronic diastolic (congestive) heart failure: Secondary | ICD-10-CM

## 2022-12-25 DIAGNOSIS — I4892 Unspecified atrial flutter: Secondary | ICD-10-CM

## 2022-12-25 DIAGNOSIS — I10 Essential (primary) hypertension: Secondary | ICD-10-CM | POA: Diagnosis not present

## 2022-12-25 DIAGNOSIS — Z131 Encounter for screening for diabetes mellitus: Secondary | ICD-10-CM

## 2022-12-25 DIAGNOSIS — R739 Hyperglycemia, unspecified: Secondary | ICD-10-CM

## 2022-12-25 DIAGNOSIS — E890 Postprocedural hypothyroidism: Secondary | ICD-10-CM

## 2022-12-25 MED ORDER — GABAPENTIN 300 MG PO CAPS: 300.0000 mg | ORAL_CAPSULE | Freq: Three times a day (TID) | ORAL | 5 refills | Status: DC

## 2022-12-25 MED ORDER — TRAZODONE HCL 50 MG PO TABS: 25.0000 mg | ORAL_TABLET | Freq: Every evening | ORAL | 5 refills | Status: DC | PRN

## 2022-12-25 NOTE — Patient Instructions (Addendum)
Restart gabapentin but lets try 300 mg three times daily - take last dose before bed. If not sleeping better in a day or two with this can also add trazodone in before bed. Avoid screens before bed.   Schedule a lab visit at the check out desk within 2 weeks. Do not have to be fasting but if you do fast that's fine  Recommended follow up: Return in about 4 months (around 04/26/2023) for physical or sooner if needed.Schedule b4 you leave.

## 2022-12-25 NOTE — Progress Notes (Signed)
Phone [phone] In person visit   Subjective:   Wayne Booth is a 82 y.o. year old very pleasant male patient who presents for/with See problem oriented charting Chief Complaint  Patient presents with   sleep aide    Pt would like to discuss a sleep aide    bilateral foot pain    Pt would like pain meds to help, states he does not think Tramadol is helping.   colonosocpy    Pt states he is finished getting colonoscopies.    Past Medical History-  Patient Active Problem List   Diagnosis Date Noted   ILD (interstitial lung disease) (HCC) 10/13/2019    Priority: High   Pneumonia due to COVID-19 virus 04/20/2019    Priority: High   Memory loss 06/10/2018    Priority: High   Chronic respiratory failure with hypoxia (HCC) 04/26/2018    Priority: High   CAD (coronary artery disease) 04/09/2018    Priority: High   CKD (chronic kidney disease) stage 3, GFR 30-59 ml/min (HCC)     Priority: High   (HFpEF) heart failure with preserved ejection fraction (HCC) 12/16/2017    Priority: High   Chest pain     Priority: High   Atrial flutter (HCC) 11/01/2017    Priority: High   Chronic pulmonary embolism (HCC) 10/23/2016    Priority: High   Asthmatic bronchitis 06/09/2011    Priority: High   History of prostate cancer-seed implantation 2010.  Dr. Laverle Patter 10/02/2008    Priority: High   History of renal cell carcinoma 12/26/2007    Priority: High   PTSD (post-traumatic stress disorder) 07/27/2018    Priority: Medium    HCAP (healthcare-associated pneumonia) 02/03/2018    Priority: Medium    History of adenomatous polyp of colon 04/12/2017    Priority: Medium    DDD (degenerative disc disease), cervical 11/13/2015    Priority: Medium    Essential tremor 08/14/2015    Priority: Medium    Gout 08/14/2015    Priority: Medium    Hypothyroidism 07/12/2014    Priority: Medium    Hyperglycemia 07/12/2014    Priority: Medium    GAD (generalized anxiety disorder) 11/07/2013     Priority: Medium    OSA (obstructive sleep apnea) 07/07/2011    Priority: Medium    Morbid obesity (HCC) 10/02/2008    Priority: Medium    Insomnia 12/26/2007    Priority: Medium    Essential hypertension 12/28/2006    Priority: Medium    Aortic atherosclerosis (HCC) 05/10/2018    Priority: Low   First degree AV block 04/15/2018    Priority: Low   Constipation 01/21/2018    Priority: Low   Anticoagulated     Priority: Low   Pain in left knee 07/15/2017    Priority: Low   Bilateral foot pain 07/13/2017    Priority: Low   Community acquired pneumonia 06/18/2017    Priority: Low   Status post nephrectomy 06/09/2017    Priority: Low   VTE (venous thromboembolism) 06/09/2017    Priority: Low   Actinic keratosis 11/27/2009    Priority: Low   Osteoarthritis 11/13/2008    Priority: Low   TESTOSTERONE DEFICIENCY 12/28/2006    Priority: Low   Palliative care patient 10/26/2022   Conjunctivitis 10/12/2022   Physical deconditioning 07/17/2022   Pharmacologic therapy 01/26/2021   Disorder of skeletal system 01/26/2021   Problems influencing health status 01/26/2021   Pulmonary nodule 10/13/2019   Hyponatremia 04/15/2018   Pleural  effusion 04/14/2018   Chronic pain syndrome 04/09/2018   Dyspnea    History of fusion of cervical spine 04/24/2015   History of total knee replacement, right 12/17/2008   History of total knee replacement, left 10/28/2006    Medications- reviewed and updated Current Outpatient Medications  Medication Sig Dispense Refill   albuterol (PROVENTIL) (2.5 MG/3ML) 0.083% nebulizer solution Take 3 mLs (2.5 mg total) by nebulization every 6 (six) hours as needed for wheezing or shortness of breath. 75 mL 3   albuterol (VENTOLIN HFA) 108 (90 Base) MCG/ACT inhaler Inhale 2 puffs into the lungs every 6 (six) hours as needed for wheezing or shortness of breath. 18 g 2   apixaban (ELIQUIS) 5 MG TABS tablet Take 1 tablet (5 mg total) by mouth 2 (two) times daily. 60  tablet 10   Budeson-Glycopyrrol-Formoterol (BREZTRI AEROSPHERE) 160-9-4.8 MCG/ACT AERO Inhale 2 puffs into the lungs in the morning and at bedtime. 10.7 g 11   Budeson-Glycopyrrol-Formoterol (BREZTRI AEROSPHERE) 160-9-4.8 MCG/ACT AERO Inhale 2 puffs into the lungs in the morning and at bedtime. 5.9 g 0   busPIRone (BUSPAR) 5 MG tablet TAKE ONE TABLET BY MOUTH TWICE A DAY AS NEEDED FOR ANXIETY 180 tablet 1   escitalopram (LEXAPRO) 20 MG tablet Take 1 tablet (20 mg total) by mouth daily. 90 tablet 3   furosemide (LASIX) 40 MG tablet Take 1 tablet (40 mg total) by mouth daily as needed for fluid or edema (or weight gain). 30 tablet 0   gabapentin (NEURONTIN) 300 MG capsule Take 1 capsule (300 mg total) by mouth 3 (three) times daily. 90 capsule 5   predniSONE (DELTASONE) 10 MG tablet Take 1 tablet (10 mg total) by mouth daily with breakfast. 30 tablet 1   traZODone (DESYREL) 50 MG tablet Take 0.5-1 tablets (25-50 mg total) by mouth at bedtime as needed for sleep. 30 tablet 5   traMADol (ULTRAM) 50 MG tablet Take 1 tablet (50 mg total) by mouth every 6 (six) hours as needed (prefer max 3 per day). (Patient not taking: Reported on 12/25/2022) 90 tablet 3   No current facility-administered medications for this visit.     Objective:  BP 120/72   Pulse 78   Temp 98.4 F (36.9 C)   Ht 5\' 11"  (1.803 m)   Wt 231 lb 6.4 oz (105 kg)   SpO2 90%   BMI 32.27 kg/m  Gen: NAD, resting comfortably CV: RRR no murmurs rubs or gallops Lungs: crackles lung bases once again Ext: trace edema Skin: warm, dry     Assessment and Plan   #CHF diastolic # Chronic respiratory failure with known interstitial lung disease- continues on 02 at night after covid 19 in October 2020- no daytime oxgyen regularly- occasional use - also could be related to asthmatic bronchitis on betrizi instead of symbicort followed by pulmonology Dr. Vassie Loll. # OSA on CPAP in past S: Medication: in past furosemide 40mg  twice daily- not  needing lately. Did need potassium with this in past  Edema: none Weight gain:down a few lbs Shortness of breath: stable  A/P: CHF-appears euvolemic- continue current medications - sparing lasix  Chronic respiratory failure- continues to use oxygen at home Asthmatic bronchitis-overall stable- continue pulmonary follow up    # Atrial flutter with history of ablation-has done well since that time #Chronic PE- history PE following hand surgery  In past S: Rate controlled without propranolol- has been off since october Anticoagulated with  eliquis 5 mg BID A/P: appropriately anticoagulated and  rate controlled (no medications at present)- continue current medicine Chronic PE appropriately treated with Eliquis   #Neuropathy- feet. Reports he ran out of gabapentin- not sure when- pain has been worse- he would like to retrial. To reduce overall medicine risk we opted to try gabapentin 300 mg three times daily -history cervical disc disease but better lately -wants me to leave tramadol on list for neck and neuropathy- still once or twice a week  # Depression # Generalized anxiety disorder  S: Medication:Lexapro 20Mg , Buspirone 5Mg  once or twice a week. Not taking alprazolam- never filled.  - in the past nortriptyline 50mg  down from 75 mg- for cervical disc disease primarily - Also on trazodone for sleep in the past but we tried belsomra instead in October (only filled once)- he thinks that may not have worked as well- wants to try trazodone again  -denies depression, anxiety, or SI thoughts A/P: Depression and anxiety well controlled other than poor sleep- wants to trial low dose trazodone again if not able to sleep when starts gabapentin- has done well in past on this along with Lexapro-no serotonin syndrome in past  # Hyperglycemia/insulin resistance/prediabetes-peak A1c 6.3 recently but later improved after weight loss S:  Medication: None-in the past metformin 500mg  daily Lab Results   Component Value Date   HGBA1C 5.7 04/13/2022   HGBA1C 5.6 10/01/2021   HGBA1C 5.6 11/05/2020   A/P: Due for updated A1c-he does not want to do labs today but agrees to schedule for follow-up  # postoperative  Hypothyroidism- cannot use TSH S: compliant On thyroid medication-Levothyroxine 150Mg  A/P:Well-controlled in past-ordered T3 and T4 when he comes back for labs  #hypertension S: medication: None-in the past Propranolol 10mg  BID, Lasix 40 mg twice daily BP Readings from Last 3 Encounters:  12/25/22 120/72  10/26/22 104/68  10/12/22 106/64  A/P: Well-controlled without medicine-continue to monitor  #Memory loss-intermittent issues with memory after multiple hospitalizations since 2019. MRI of the brain January 2020 largely unremarkable. B12 levels have been normal. Issues even when depression well controlled.  Today he reports no worsening symptoms-son concurs  #Gout-no recent flares off of allopurinol  # Health maintenance-With overall health declines colonoscopy for now- last done 2018 and plan was 3 year follow up due to polyps with Dr. Terri Piedra- he knows we may miss something like colon cancer by not doing this   Recommended follow up: Return in about 4 months (around 04/26/2023) for physical or sooner if needed.Schedule b4 you leave. Future Appointments  Date Time Provider Department Center  01/04/2023  1:30 PM [facility] ANNUAL WELLNESS VISIT 1 [facility]  01/05/2023  2:30 PM Helane Gunther, DPM [facility]    Lab/Order associations:   ICD-10-CM   1. Essential hypertension  I10 Comprehensive metabolic panel    CBC with Differential/Platelet    2. Hyperglycemia  R73.9 Hemoglobin A1c    3. Screening for diabetes mellitus  Z13.1 Hemoglobin A1c    4. 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 Atrial fibrillation, unspecified type (HCC) Chronic I48.91       Meds ordered this encounter  Medications   traZODone (DESYREL) 50 MG tablet     Sig: Take 0.5-1 tablets (25-50 mg total) by mouth at bedtime as needed for sleep.    Dispense:  30 tablet    Refill:  5   gabapentin (NEURONTIN) 300 MG capsule    Sig: Take 1 capsule (300 mg total) by mouth 3 (three) times  daily.    Dispense:  90 capsule    Refill:  5    Return precautions advised.  Tana Conch, MD

## 2022-12-30 DIAGNOSIS — I5030 Unspecified diastolic (congestive) heart failure: Secondary | ICD-10-CM | POA: Diagnosis not present

## 2022-12-30 DIAGNOSIS — I110 Hypertensive heart disease with heart failure: Secondary | ICD-10-CM | POA: Diagnosis not present

## 2022-12-30 DIAGNOSIS — J8410 Pulmonary fibrosis, unspecified: Secondary | ICD-10-CM | POA: Diagnosis not present

## 2022-12-30 DIAGNOSIS — I4891 Unspecified atrial fibrillation: Secondary | ICD-10-CM | POA: Diagnosis not present

## 2022-12-30 DIAGNOSIS — U099 Post covid-19 condition, unspecified: Secondary | ICD-10-CM | POA: Diagnosis not present

## 2022-12-30 DIAGNOSIS — I700 Atherosclerosis of aorta: Secondary | ICD-10-CM | POA: Diagnosis not present

## 2023-01-01 DIAGNOSIS — I4891 Unspecified atrial fibrillation: Secondary | ICD-10-CM | POA: Diagnosis not present

## 2023-01-01 DIAGNOSIS — I5030 Unspecified diastolic (congestive) heart failure: Secondary | ICD-10-CM | POA: Diagnosis not present

## 2023-01-01 DIAGNOSIS — I700 Atherosclerosis of aorta: Secondary | ICD-10-CM | POA: Diagnosis not present

## 2023-01-01 DIAGNOSIS — J8410 Pulmonary fibrosis, unspecified: Secondary | ICD-10-CM | POA: Diagnosis not present

## 2023-01-01 DIAGNOSIS — U099 Post covid-19 condition, unspecified: Secondary | ICD-10-CM | POA: Diagnosis not present

## 2023-01-01 DIAGNOSIS — I110 Hypertensive heart disease with heart failure: Secondary | ICD-10-CM | POA: Diagnosis not present

## 2023-01-04 ENCOUNTER — Ambulatory Visit (INDEPENDENT_AMBULATORY_CARE_PROVIDER_SITE_OTHER): Payer: Medicare Other

## 2023-01-04 VITALS — Wt 231.0 lb

## 2023-01-04 DIAGNOSIS — Z0000 Encounter for general adult medical examination without abnormal findings: Secondary | ICD-10-CM | POA: Diagnosis not present

## 2023-01-04 NOTE — Patient Instructions (Signed)
Wayne Booth , Thank you for taking time to come for your Medicare Wellness Visit. I appreciate your ongoing commitment to your health goals. Please review the following plan we discussed and let me know if I can assist you in the future.   These are the goals we discussed:  Goals      Patient Stated     Stay healthy as I can      Pt would like to breath better     Care Coordination Interventions: Advised patient to call to schedule follow up with provider and Annual Wellness Visit Provided education to patient re: pulmonary fibrosis, Annual Wellness Visit, care coordination services Assessed social determinant of health barriers Scheduled RNCM call on 03/06/22       Weight (lb) < 250 lb (113.4 kg)        This is a list of the screening recommended for you and due dates:  Health Maintenance  Topic Date Due   Colon Cancer Screening  05/27/2020   COVID-19 Vaccine (4 - 2023-24 season) 01/10/2023*   Flu Shot  01/28/2023   Pneumonia Vaccine  Completed   Zoster (Shingles) Vaccine  Completed   HPV Vaccine  Aged Out   DTaP/Tdap/Td vaccine  Discontinued   Hepatitis C Screening  Discontinued  *Topic was postponed. The date shown is not the original due date.    Advanced directives: .Please bring a copy of your health care power of attorney and living will to the office at your convenience.  Conditions/risks identified: stay healthy as I can   Next appointment: Follow up in one year for your annual wellness visit.   Preventive Care 26 Years and Older, Male  Preventive care refers to lifestyle choices and visits with your health care provider that can promote health and wellness. What does preventive care include? A yearly physical exam. This is also called an annual well check. Dental exams once or twice a year. Routine eye exams. Ask your health care provider how often you should have your eyes checked. Personal lifestyle choices, including: Daily care of your teeth and  gums. Regular physical activity. Eating a healthy diet. Avoiding tobacco and drug use. Limiting alcohol use. Practicing safe sex. Taking low doses of aspirin every day. Taking vitamin and mineral supplements as recommended by your health care provider. What happens during an annual well check? The services and screenings done by your health care provider during your annual well check will depend on your age, overall health, lifestyle risk factors, and family history of disease. Counseling  Your health care provider may ask you questions about your: Alcohol use. Tobacco use. Drug use. Emotional well-being. Home and relationship well-being. Sexual activity. Eating habits. History of falls. Memory and ability to understand (cognition). Work and work Astronomer. Screening  You may have the following tests or measurements: Height, weight, and BMI. Blood pressure. Lipid and cholesterol levels. These may be checked every 5 years, or more frequently if you are over 10 years old. Skin check. Lung cancer screening. You may have this screening every year starting at age 64 if you have a 30-pack-year history of smoking and currently smoke or have quit within the past 15 years. Fecal occult blood test (FOBT) of the stool. You may have this test every year starting at age 62. Flexible sigmoidoscopy or colonoscopy. You may have a sigmoidoscopy every 5 years or a colonoscopy every 10 years starting at age 76. Prostate cancer screening. Recommendations will vary depending on your family history  and other risks. Hepatitis C blood test. Hepatitis B blood test. Sexually transmitted disease (STD) testing. Diabetes screening. This is done by checking your blood sugar (glucose) after you have not eaten for a while (fasting). You may have this done every 1-3 years. Abdominal aortic aneurysm (AAA) screening. You may need this if you are a current or former smoker. Osteoporosis. You may be screened  starting at age 67 if you are at high risk. Talk with your health care provider about your test results, treatment options, and if necessary, the need for more tests. Vaccines  Your health care provider may recommend certain vaccines, such as: Influenza vaccine. This is recommended every year. Tetanus, diphtheria, and acellular pertussis (Tdap, Td) vaccine. You may need a Td booster every 10 years. Zoster vaccine. You may need this after age 30. Pneumococcal 13-valent conjugate (PCV13) vaccine. One dose is recommended after age 61. Pneumococcal polysaccharide (PPSV23) vaccine. One dose is recommended after age 68. Talk to your health care provider about which screenings and vaccines you need and how often you need them. This information is not intended to replace advice given to you by your health care provider. Make sure you discuss any questions you have with your health care provider. Document Released: 07/12/2015 Document Revised: 03/04/2016 Document Reviewed: 04/16/2015 Elsevier Interactive Patient Education  2017 [employer].  Fall Prevention in the Home Falls can cause injuries. They can happen to people of all ages. There are many things you can do to make your home safe and to help prevent falls. What can I do on the outside of my home? Regularly fix the edges of walkways and driveways and fix any cracks. Remove anything that might make you trip as you walk through a door, such as a raised step or threshold. Trim any bushes or trees on the path to your home. Use bright outdoor lighting. Clear any walking paths of anything that might make someone trip, such as rocks or tools. Regularly check to see if handrails are loose or broken. Make sure that both sides of any steps have handrails. Any raised decks and porches should have guardrails on the edges. Have any leaves, snow, or ice cleared regularly. Use sand or salt on walking paths during winter. Clean up any spills in your garage  right away. This includes oil or grease spills. What can I do in the bathroom? Use night lights. Install grab bars by the toilet and in the tub and shower. Do not use towel bars as grab bars. Use non-skid mats or decals in the tub or shower. If you need to sit down in the shower, use a plastic, non-slip stool. Keep the floor dry. Clean up any water that spills on the floor as soon as it happens. Remove soap buildup in the tub or shower regularly. Attach bath mats securely with double-sided non-slip rug tape. Do not have throw rugs and other things on the floor that can make you trip. What can I do in the bedroom? Use night lights. Make sure that you have a light by your bed that is easy to reach. Do not use any sheets or blankets that are too big for your bed. They should not hang down onto the floor. Have a firm chair that has side arms. You can use this for support while you get dressed. Do not have throw rugs and other things on the floor that can make you trip. What can I do in the kitchen? Clean up any spills  right away. Avoid walking on wet floors. Keep items that you use a lot in easy-to-reach places. If you need to reach something above you, use a strong step stool that has a grab bar. Keep electrical cords out of the way. Do not use floor polish or wax that makes floors slippery. If you must use wax, use non-skid floor wax. Do not have throw rugs and other things on the floor that can make you trip. What can I do with my stairs? Do not leave any items on the stairs. Make sure that there are handrails on both sides of the stairs and use them. Fix handrails that are broken or loose. Make sure that handrails are as long as the stairways. Check any carpeting to make sure that it is firmly attached to the stairs. Fix any carpet that is loose or worn. Avoid having throw rugs at the top or bottom of the stairs. If you do have throw rugs, attach them to the floor with carpet tape. Make  sure that you have a light switch at the top of the stairs and the bottom of the stairs. If you do not have them, ask someone to add them for you. What else can I do to help prevent falls? Wear shoes that: Do not have high heels. Have rubber bottoms. Are comfortable and fit you well. Are closed at the toe. Do not wear sandals. If you use a stepladder: Make sure that it is fully opened. Do not climb a closed stepladder. Make sure that both sides of the stepladder are locked into place. Ask someone to hold it for you, if possible. Clearly mark and make sure that you can see: Any grab bars or handrails. First and last steps. Where the edge of each step is. Use tools that help you move around (mobility aids) if they are needed. These include: Canes. Walkers. Scooters. Crutches. Turn on the lights when you go into a dark area. Replace any light bulbs as soon as they burn out. Set up your furniture so you have a clear path. Avoid moving your furniture around. If any of your floors are uneven, fix them. If there are any pets around you, be aware of where they are. Review your medicines with your doctor. Some medicines can make you feel dizzy. This can increase your chance of falling. Ask your doctor what other things that you can do to help prevent falls. This information is not intended to replace advice given to you by your health care provider. Make sure you discuss any questions you have with your health care provider. Document Released: 04/11/2009 Document Revised: 11/21/2015 Document Reviewed: 07/20/2014 Elsevier Interactive Patient Education  2017 [employer].

## 2023-01-04 NOTE — Addendum Note (Signed)
Addended by: Marzella Schlein on: 01/04/2023 03:52 PM   Modules accepted: Orders

## 2023-01-04 NOTE — Progress Notes (Addendum)
Subjective:   Wayne Booth is a 82 y.o. male who presents for Medicare Annual/Subsequent preventive examination.  Visit Complete: Virtual  I connected with  Wayne Booth on 01/04/23 by a audio enabled telemedicine application and verified that I am speaking with the correct person using two identifiers.  Patient Location: Home  Provider Location: Home Office  I discussed the limitations of evaluation and management by telemedicine. The patient expressed understanding and agreed to proceed.   Review of Systems     Cardiac Risk Factors include: advanced age (>42men, >24 women);obesity (BMI >30kg/m2);male gender;hypertension     Objective:    Today's Vitals   01/04/23 1334  Weight: 231 lb (104.8 kg)   Body mass index is 32.22 kg/m.     01/04/2023    1:40 PM 12/13/2019    9:21 AM 09/18/2019    8:49 PM 07/14/2019    9:05 AM 04/20/2019    4:36 PM 04/20/2019    1:22 PM 04/14/2019    9:49 AM  Advanced Directives  Does Patient Have a Medical Advance Directive? Yes Yes Yes Yes Yes Yes Yes  Type of Estate agent of [place];Living will Healthcare Power of [place];Living will Healthcare Power of [place];Living will Healthcare Power of [place];Living will Healthcare Power of [place];Living will Living will;Healthcare Power of Attorney Living will;Healthcare Power of Attorney  Does patient want to make changes to medical advance directive?   No - Patient declined No - Patient declined No - Patient declined  No - Patient declined  Copy of Healthcare Power of Attorney in Chart? No - copy requested  No - copy requested No - copy requested No - copy requested No - copy requested No - copy requested    Current Medications (verified) Outpatient Encounter Medications as of 01/04/2023  Medication Sig   albuterol (PROVENTIL) (2.5 MG/3ML) 0.083% nebulizer solution Take 3 mLs (2.5 mg total) by nebulization every 6 (six) hours as needed for wheezing or shortness of  breath.   albuterol (VENTOLIN HFA) 108 (90 Base) MCG/ACT inhaler Inhale 2 puffs into the lungs every 6 (six) hours as needed for wheezing or shortness of breath.   Budeson-Glycopyrrol-Formoterol (BREZTRI AEROSPHERE) 160-9-4.8 MCG/ACT AERO Inhale into the lungs.   busPIRone (BUSPAR) 5 MG tablet TAKE ONE TABLET BY MOUTH TWICE A DAY AS NEEDED FOR ANXIETY   escitalopram (LEXAPRO) 20 MG tablet Take 1 tablet (20 mg total) by mouth daily.   gabapentin (NEURONTIN) 300 MG capsule Take 1 capsule (300 mg total) by mouth 3 (three) times daily.   traMADol (ULTRAM) 50 MG tablet Take 1 tablet (50 mg total) by mouth every 6 (six) hours as needed (prefer max 3 per day).   traZODone (DESYREL) 50 MG tablet Take 0.5-1 tablets (25-50 mg total) by mouth at bedtime as needed for sleep.   [DISCONTINUED] apixaban (ELIQUIS) 5 MG TABS tablet Take 1 tablet (5 mg total) by mouth 2 (two) times daily. (Patient not taking: Reported on 01/04/2023)   [DISCONTINUED] Budeson-Glycopyrrol-Formoterol (BREZTRI AEROSPHERE) 160-9-4.8 MCG/ACT AERO Inhale 2 puffs into the lungs in the morning and at bedtime.   [DISCONTINUED] Budeson-Glycopyrrol-Formoterol (BREZTRI AEROSPHERE) 160-9-4.8 MCG/ACT AERO Inhale 2 puffs into the lungs in the morning and at bedtime.   [DISCONTINUED] furosemide (LASIX) 40 MG tablet Take 1 tablet (40 mg total) by mouth daily as needed for fluid or edema (or weight gain).   [DISCONTINUED] predniSONE (DELTASONE) 10 MG tablet Take 1 tablet (10 mg total) by mouth daily with breakfast.   No  facility-administered encounter medications on file as of 01/04/2023.    Allergies (verified) Ambien [zolpidem]   History: Past Medical History:  Diagnosis Date   Allergy    States his nose runs when he is around grease.    Anxiety    Arthritis    Atrial fibrillation (HCC)    Cancer (HCC)    Coronary artery disease    History of total knee replacement    Hypertension    Hypothyroidism    Kidney cancer, primary, with  metastasis from kidney to other site [facility])    Kidney stone    OSA (obstructive sleep apnea)    pt does not wear cpap at night   Pituitary cyst (HCC)    Pneumonia    hx of   Prostate cancer [facility])    Past Surgical History:  Procedure Laterality Date   ABLATION OF DYSRHYTHMIC FOCUS  01/28/2018   ANTERIOR CERVICAL DECOMP/DISCECTOMY FUSION N/A 08/29/2012   Procedure: ANTERIOR CERVICAL DECOMPRESSION/DISCECTOMY FUSION 1 LEVEL;  Surgeon: Tia Alert, MD;  Location: [facility] NEURO ORS;  Service: Neurosurgery;  Laterality: N/A;   APPENDECTOMY     ATRIAL FIBRILLATION ABLATION N/A 01/28/2018   Procedure: ATRIAL FIBRILLATION ABLATION;  Surgeon: Regan Lemming, MD;  Location: [facility] INVASIVE CV LAB;  Service: Cardiovascular;  Laterality: N/A;   CARDIAC CATHETERIZATION  2008   clean   CHOLECYSTECTOMY     COLONOSCOPY W/ POLYPECTOMY     CRANIOTOMY N/A 11/08/2012   Procedure: CRANIOTOMY HYPOPHYSECTOMY TRANSNASAL APPROACH;  Surgeon: [name], MD;  Location: [facility] NEURO ORS;  Service: Neurosurgery;  Laterality: N/A;  Transphenoidal Resection of Pituitary Tumor   FINGER SURGERY Left 2017   Dr Amanda Pea   INSERTION PROSTATE RADIATION SEED     IR THORACENTESIS ASP PLEURAL SPACE W/IMG GUIDE  04/15/2018   KIDNEY STONE SURGERY     KNEE ARTHROSCOPY  2007   left   NEPHRECTOMY Left    POSTERIOR CERVICAL LAMINECTOMY Left 04/24/2015   Procedure: Foraminotomy cervical five - cervical six cervical six - cervical seven left;  Surgeon: Tia Alert, MD;  Location: [facility] NEURO ORS;  Service: Neurosurgery;  Laterality: Left;   REPLACEMENT TOTAL KNEE BILATERAL     RIGHT/LEFT HEART CATH AND CORONARY ANGIOGRAPHY N/A 07/14/2019   Procedure: RIGHT/LEFT HEART CATH AND CORONARY ANGIOGRAPHY;  Surgeon: Corky Crafts, MD;  Location: [facility] INVASIVE CV LAB;  Service: Cardiovascular;  Laterality: N/A;   Family History  Problem Relation Age of Onset   Cancer Father        [place], died before patient born   Cancer Mother         stomach, died when he was young   Kidney cancer Sister    Colon cancer Brother 75   Esophageal cancer Neg Hx    Rectal cancer Neg Hx    Stomach cancer Neg Hx    Social History   Socioeconomic History   Marital status: Married    Spouse name: Randa Evens   Number of children: Y   Years of education: Not on file   Highest education level: Not on file  Occupational History   Occupation: retired Engineer, site: RETIRED  Tobacco Use   Smoking status: Never   Smokeless tobacco: Never  Vaping Use   Vaping Use: Never used  Substance and Sexual Activity   Alcohol use: Yes    Alcohol/week: 2.0 standard drinks of alcohol    Types: 2 Shots of liquor per week  Comment: socially   Drug use: No   Sexual activity: Not on file  Other Topics Concern   Not on file  Social History Narrative   Married 1965 (wife patient of Dr. Orvan Falconer). 2 sons. 4 granddaughters.       Retired Investment banker, corporate.       Hobbies: golf, fishing, formerly hunting.    Social Determinants of Health   Financial Resource Strain: Low Risk  (01/04/2023)   Overall Financial Resource Strain (CARDIA)    Difficulty of Paying Living Expenses: Not hard at all  Food Insecurity: No Food Insecurity (01/04/2023)   Hunger Vital Sign    Worried About Running Out of Food in the Last Year: Never true    Ran Out of Food in the Last Year: Never true  Transportation Needs: No Transportation Needs (01/04/2023)   PRAPARE - Administrator, Civil Service (Medical): No    Lack of Transportation (Non-Medical): No  Physical Activity: Inactive (01/04/2023)   Exercise Vital Sign    Days of Exercise per Week: 0 days    Minutes of Exercise per Session: 0 min  Stress: No Stress Concern Present (01/04/2023)   [employer] of Occupational Health - Occupational Stress Questionnaire    Feeling of Stress : Not at all  Social Connections: Moderately Isolated (01/04/2023)   Social Connection and Isolation  Panel [NHANES]    Frequency of Communication with Friends and Family: Twice a week    Frequency of Social Gatherings with Friends and Family: Once a week    Attends Religious Services: Never    Database administrator or Organizations: No    Attends Engineer, structural: Never    Marital Status: Married    Tobacco Counseling Counseling given: Not Answered   Clinical Intake:  Pre-visit preparation completed: Yes  Pain : No/denies pain     BMI - recorded: 32.22 Nutritional Status: BMI > 30  Obese Nutritional Risks: None Diabetes: No  How often do you need to have someone help you when you read instructions, pamphlets, or other written materials from your doctor or pharmacy?: 1 - Never  Interpreter Needed?: No  Information entered by :: Lanier Ensign, LPN   Activities of Daily Living    01/04/2023    1:42 PM  In your present state of health, do you have any difficulty performing the following activities:  Hearing? 0  Vision? 0  Difficulty concentrating or making decisions? 0  Walking or climbing stairs? 0  Dressing or bathing? 0  Doing errands, shopping? 0  Preparing Food and eating ? Y  Comment wife assist  Using the Toilet? N  In the past six months, have you accidently leaked urine? N  Do you have problems with loss of bowel control? N  Managing your Medications? N  Managing your Finances? N  Housekeeping or managing your Housekeeping? N    Patient Care Team: Shelva Majestic, MD as PCP - General (Family Medicine) Corky Crafts, MD as PCP - Cardiology (Cardiology) Heloise Purpura, MD as Surgeon (Urology) Oretha Milch, MD as Consulting Physician (Pulmonary Disease) Vivi Barrack, DPM as Consulting Physician (Podiatry) Alejandro Mulling, RN as [facility] Care Management  Indicate any recent Medical Services you may have received from other than [facility] providers in the past year (date may be approximate).     Assessment:    This is a routine wellness examination for Lyndsey.  Hearing/Vision screen Hearing Screening - Comments:: Pt denies  any hearing issues  Vision Screening - Comments:: Pt follows up with Dr Randon Goldsmith  for annual eye exams   Dietary issues and exercise activities discussed:     Goals Addressed             This Visit's Progress    Patient Stated       Stay healthy as I can        Depression Screen    01/04/2023    1:38 PM 01/30/2022   12:57 PM 10/01/2021   10:03 AM 11/05/2020   10:19 AM 05/10/2020    2:20 PM 12/13/2019    9:34 AM 07/31/2019    8:35 AM  PHQ 2/9 Scores  PHQ - 2 Score 0 0 0 0 2 0 0  PHQ- 9 Score  0 0 6 8 0 0    Fall Risk    01/04/2023    1:46 PM 01/30/2022   12:53 PM 11/05/2020   10:19 AM 12/13/2019    9:33 AM 04/14/2019    9:49 AM  Fall Risk   Falls in the past year? 0 0 0 0 0  Number falls in past yr: 0 0 0 0   Injury with Fall? 0 0 0  0  Risk for fall due to : Impaired balance/gait;Impaired mobility;Impaired vision No Fall Risks  Orthopedic patient Impaired mobility  Follow up Falls prevention discussed   Falls prevention discussed Falls prevention discussed;Education provided;Falls evaluation completed    MEDICARE RISK AT HOME:  Medicare Risk at Home - 01/04/23 1344     Any stairs in or around the home? Yes    If so, are there any without handrails? No    Home free of loose throw rugs in walkways, pet beds, electrical cords, etc? Yes    Adequate lighting in your home to reduce risk of falls? Yes    Life alert? No    Use of a cane, walker or w/c? No    Grab bars in the bathroom? Yes    Shower chair or bench in shower? Yes    Elevated toilet seat or a handicapped toilet? No             TIMED UP AND GO:  Was the test performed?  No    Cognitive Function:        01/04/2023    1:43 PM 04/14/2019    9:50 AM  6CIT Screen  What Year? 0 points 0 points  What month? 0 points 0 points  What time? 0 points 0 points  Count back from 20 0 points 0 points   Months in reverse 4 points 0 points  Repeat phrase 4 points 2 points  Total Score 8 points 2 points    Immunizations Immunization History  Administered Date(s) Administered   Fluad Quad(high Dose 65+) 03/16/2019, 05/14/2020, 06/10/2021, 04/13/2022   Influenza Split 04/01/2011, 03/29/2012   Influenza Whole 04/12/2007, 03/18/2009, 03/04/2010   Influenza, High Dose Seasonal PF 04/03/2014, 04/04/2015, 04/24/2016, 04/12/2017, 04/26/2018   Influenza,inj,Quad PF,6+ Mos 03/22/2013   Influenza-Unspecified 05/29/2017   PFIZER(Purple Top)SARS-COV-2 Vaccination 08/19/2019, 09/11/2019, 06/09/2020   PNEUMOCOCCAL CONJUGATE-20 12/26/2020   Pneumococcal Conjugate-13 11/12/2014   Pneumococcal Polysaccharide-23 06/09/2011   Pneumococcal-Unspecified 06/30/2015   Td 06/29/1994   Tdap 06/09/2011   Zoster Recombinant(Shingrix) 03/16/2019, 07/19/2019   Zoster, Live 09/22/2007    TDAP status: Due, Education has been provided regarding the importance of this vaccine. Advised may receive this vaccine at local pharmacy or Health Dept. Aware to provide a  copy of the vaccination record if obtained from local pharmacy or Health Dept. Verbalized acceptance and understanding.  Flu Vaccine status: Up to date  Pneumococcal vaccine status: Up to date  Covid-19 vaccine status: Completed vaccines  Qualifies for Shingles Vaccine? Yes   Zostavax completed Yes   Shingrix Completed?: Yes  Screening Tests Health Maintenance  Topic Date Due   Colonoscopy  05/27/2020   COVID-19 Vaccine (4 - 2023-24 season) 01/10/2023 (Originally 02/27/2022)   INFLUENZA VACCINE  01/28/2023   Pneumonia Vaccine 35+ Years old  Completed   Zoster Vaccines- Shingrix  Completed   HPV VACCINES  Aged Out   DTaP/Tdap/Td  Discontinued   Hepatitis C Screening  Discontinued    Health Maintenance  Health Maintenance Due  Topic Date Due   Colonoscopy  05/27/2020    Colorectal cancer screening: Type of screening: Colonoscopy. Completed  05/27/17. Repeat every 3 years or as directed    Additional Screening:  Hepatitis C Screening: Completed 02/20/18  Vision Screening: Recommended annual ophthalmology exams for early detection of glaucoma and other disorders of the eye. Is the patient up to date with their annual eye exam?  Yes  Who is the provider or what is the name of the office in which the patient attends annual eye exams? Dr Randon Goldsmith  If pt is not established with a provider, would they like to be referred to a provider to establish care? No .   Dental Screening: Recommended annual dental exams for proper oral hygiene  Community Resource Referral / Chronic Care Management: CRR required this visit?  No   CCM required this visit?  No     Plan:     I have personally reviewed and noted the following in the patient's chart:   Medical and social history Use of alcohol, tobacco or illicit drugs  Current medications and supplements including opioid prescriptions. Patient is currently taking opioid prescriptions. Information provided to patient regarding non-opioid alternatives. Patient advised to discuss non-opioid treatment plan with their provider. Functional ability and status Nutritional status Physical activity Advanced directives List of other physicians Hospitalizations, surgeries, and ER visits in previous 12 months Vitals Screenings to include cognitive, depression, and falls Referrals and appointments  In addition, I have reviewed and discussed with patient certain preventive protocols, quality metrics, and best practice recommendations. A written personalized care plan for preventive services as well as general preventive health recommendations were provided to patient.     Marzella Schlein, LPN   07/04/1094   After Visit Summary: (MyChart) Due to this being a telephonic visit, the after visit summary with patients personalized plan was offered to patient via MyChart   Nurse Notes: none

## 2023-01-05 ENCOUNTER — Ambulatory Visit: Payer: Medicare Other | Admitting: Podiatry

## 2023-01-06 DIAGNOSIS — I5030 Unspecified diastolic (congestive) heart failure: Secondary | ICD-10-CM | POA: Diagnosis not present

## 2023-01-06 DIAGNOSIS — J8410 Pulmonary fibrosis, unspecified: Secondary | ICD-10-CM | POA: Diagnosis not present

## 2023-01-06 DIAGNOSIS — I110 Hypertensive heart disease with heart failure: Secondary | ICD-10-CM | POA: Diagnosis not present

## 2023-01-06 DIAGNOSIS — I700 Atherosclerosis of aorta: Secondary | ICD-10-CM | POA: Diagnosis not present

## 2023-01-06 DIAGNOSIS — I4891 Unspecified atrial fibrillation: Secondary | ICD-10-CM | POA: Diagnosis not present

## 2023-01-06 DIAGNOSIS — U099 Post covid-19 condition, unspecified: Secondary | ICD-10-CM | POA: Diagnosis not present

## 2023-01-14 DIAGNOSIS — U099 Post covid-19 condition, unspecified: Secondary | ICD-10-CM | POA: Diagnosis not present

## 2023-01-14 DIAGNOSIS — J8410 Pulmonary fibrosis, unspecified: Secondary | ICD-10-CM | POA: Diagnosis not present

## 2023-01-14 DIAGNOSIS — I4891 Unspecified atrial fibrillation: Secondary | ICD-10-CM | POA: Diagnosis not present

## 2023-01-14 DIAGNOSIS — I110 Hypertensive heart disease with heart failure: Secondary | ICD-10-CM | POA: Diagnosis not present

## 2023-01-14 DIAGNOSIS — I700 Atherosclerosis of aorta: Secondary | ICD-10-CM | POA: Diagnosis not present

## 2023-01-14 DIAGNOSIS — I5030 Unspecified diastolic (congestive) heart failure: Secondary | ICD-10-CM | POA: Diagnosis not present

## 2023-01-16 ENCOUNTER — Other Ambulatory Visit: Payer: Self-pay | Admitting: Family Medicine

## 2023-01-18 DIAGNOSIS — I110 Hypertensive heart disease with heart failure: Secondary | ICD-10-CM | POA: Diagnosis not present

## 2023-01-18 DIAGNOSIS — I5030 Unspecified diastolic (congestive) heart failure: Secondary | ICD-10-CM | POA: Diagnosis not present

## 2023-01-18 DIAGNOSIS — I4891 Unspecified atrial fibrillation: Secondary | ICD-10-CM | POA: Diagnosis not present

## 2023-01-18 DIAGNOSIS — I700 Atherosclerosis of aorta: Secondary | ICD-10-CM | POA: Diagnosis not present

## 2023-01-18 DIAGNOSIS — U099 Post covid-19 condition, unspecified: Secondary | ICD-10-CM | POA: Diagnosis not present

## 2023-01-18 DIAGNOSIS — J8410 Pulmonary fibrosis, unspecified: Secondary | ICD-10-CM | POA: Diagnosis not present

## 2023-01-27 DIAGNOSIS — I5030 Unspecified diastolic (congestive) heart failure: Secondary | ICD-10-CM | POA: Diagnosis not present

## 2023-01-27 DIAGNOSIS — U099 Post covid-19 condition, unspecified: Secondary | ICD-10-CM | POA: Diagnosis not present

## 2023-01-27 DIAGNOSIS — I700 Atherosclerosis of aorta: Secondary | ICD-10-CM | POA: Diagnosis not present

## 2023-01-27 DIAGNOSIS — I110 Hypertensive heart disease with heart failure: Secondary | ICD-10-CM | POA: Diagnosis not present

## 2023-01-27 DIAGNOSIS — I4891 Unspecified atrial fibrillation: Secondary | ICD-10-CM | POA: Diagnosis not present

## 2023-01-27 DIAGNOSIS — J8410 Pulmonary fibrosis, unspecified: Secondary | ICD-10-CM | POA: Diagnosis not present

## 2023-02-11 ENCOUNTER — Encounter: Payer: Self-pay | Admitting: Family Medicine

## 2023-02-11 ENCOUNTER — Ambulatory Visit (INDEPENDENT_AMBULATORY_CARE_PROVIDER_SITE_OTHER): Payer: Medicare Other | Admitting: Family Medicine

## 2023-02-11 ENCOUNTER — Telehealth: Payer: Self-pay | Admitting: Family Medicine

## 2023-02-11 VITALS — BP 112/70 | HR 85 | Temp 98.6°F | Ht 71.0 in | Wt 230.8 lb

## 2023-02-11 DIAGNOSIS — N183 Chronic kidney disease, stage 3 unspecified: Secondary | ICD-10-CM | POA: Diagnosis not present

## 2023-02-11 DIAGNOSIS — F411 Generalized anxiety disorder: Secondary | ICD-10-CM | POA: Diagnosis not present

## 2023-02-11 DIAGNOSIS — F332 Major depressive disorder, recurrent severe without psychotic features: Secondary | ICD-10-CM | POA: Diagnosis not present

## 2023-02-11 MED ORDER — LORAZEPAM 0.5 MG PO TABS: 0.5000 mg | ORAL_TABLET | Freq: Every day | ORAL | 0 refills | Status: DC | PRN

## 2023-02-11 NOTE — Telephone Encounter (Signed)
Fyi.

## 2023-02-11 NOTE — Progress Notes (Signed)
Phone [phone] In person visit   Subjective:   Wayne Booth is a 82 y.o. year old very pleasant male patient who presents for/with See problem oriented charting Chief Complaint  Patient presents with   Anxiety    Pt states he needs something for nerves    Past Medical History-  Patient Active Problem List   Diagnosis Date Noted   ILD (interstitial lung disease) (HCC) 10/13/2019    Priority: High   Pneumonia due to COVID-19 virus 04/20/2019    Priority: High   Memory loss 06/10/2018    Priority: High   Chronic respiratory failure with hypoxia (HCC) 04/26/2018    Priority: High   CAD (coronary artery disease) 04/09/2018    Priority: High   CKD (chronic kidney disease) stage 3, GFR 30-59 ml/min (HCC)     Priority: High   (HFpEF) heart failure with preserved ejection fraction (HCC) 12/16/2017    Priority: High   Chest pain     Priority: High   Atrial flutter (HCC) 11/01/2017    Priority: High   Chronic pulmonary embolism (HCC) 10/23/2016    Priority: High   Asthmatic bronchitis 06/09/2011    Priority: High   History of prostate cancer-seed implantation 2010.  Dr. Laverle Patter 10/02/2008    Priority: High   History of renal cell carcinoma 12/26/2007    Priority: High   PTSD (post-traumatic stress disorder) 07/27/2018    Priority: Medium    HCAP (healthcare-associated pneumonia) 02/03/2018    Priority: Medium    History of adenomatous polyp of colon 04/12/2017    Priority: Medium    DDD (degenerative disc disease), cervical 11/13/2015    Priority: Medium    Essential tremor 08/14/2015    Priority: Medium    Gout 08/14/2015    Priority: Medium    Hypothyroidism 07/12/2014    Priority: Medium    Hyperglycemia 07/12/2014    Priority: Medium    GAD (generalized anxiety disorder) 11/07/2013    Priority: Medium    OSA (obstructive sleep apnea) 07/07/2011    Priority: Medium    Insomnia 12/26/2007    Priority: Medium    Essential hypertension 12/28/2006     Priority: Medium    Aortic atherosclerosis (HCC) 05/10/2018    Priority: Low   First degree AV block 04/15/2018    Priority: Low   Constipation 01/21/2018    Priority: Low   Anticoagulated     Priority: Low   Pain in left knee 07/15/2017    Priority: Low   Bilateral foot pain 07/13/2017    Priority: Low   Community acquired pneumonia 06/18/2017    Priority: Low   Status post nephrectomy 06/09/2017    Priority: Low   VTE (venous thromboembolism) 06/09/2017    Priority: Low   Actinic keratosis 11/27/2009    Priority: Low   Osteoarthritis 11/13/2008    Priority: Low   TESTOSTERONE DEFICIENCY 12/28/2006    Priority: Low   Palliative care patient 10/26/2022   Conjunctivitis 10/12/2022   Physical deconditioning 07/17/2022   Pharmacologic therapy 01/26/2021   Disorder of skeletal system 01/26/2021   Problems influencing health status 01/26/2021   Pulmonary nodule 10/13/2019   Hyponatremia 04/15/2018   Pleural effusion 04/14/2018   Chronic pain syndrome 04/09/2018   Dyspnea    History of fusion of cervical spine 04/24/2015   History of total knee replacement, right 12/17/2008   History of total knee replacement, left 10/28/2006    Medications- reviewed and updated Current Outpatient Medications  Medication  Sig Dispense Refill   albuterol (PROVENTIL) (2.5 MG/3ML) 0.083% nebulizer solution Take 3 mLs (2.5 mg total) by nebulization every 6 (six) hours as needed for wheezing or shortness of breath. 75 mL 3   albuterol (VENTOLIN HFA) 108 (90 Base) MCG/ACT inhaler Inhale 2 puffs into the lungs every 6 (six) hours as needed for wheezing or shortness of breath. 18 g 2   Budeson-Glycopyrrol-Formoterol (BREZTRI AEROSPHERE) 160-9-4.8 MCG/ACT AERO Inhale into the lungs.     gabapentin (NEURONTIN) 300 MG capsule Take 1 capsule (300 mg total) by mouth 3 (three) times daily. 90 capsule 5   LORazepam (ATIVAN) 0.5 MG tablet Take 1 tablet (0.5 mg total) by mouth daily as needed for anxiety (do  not take within 8 hours of bed. do not drive for 8 hours after taking.). 20 tablet 0   traZODone (DESYREL) 50 MG tablet Take 0.5-1 tablets (25-50 mg total) by mouth at bedtime as needed for sleep. 30 tablet 5   busPIRone (BUSPAR) 5 MG tablet TAKE ONE TABLET BY MOUTH TWICE A DAY AS NEEDED FOR ANXIETY (Patient not taking: Reported on 02/11/2023) 180 tablet 1   escitalopram (LEXAPRO) 20 MG tablet Take 1 tablet (20 mg total) by mouth daily. (Patient not taking: Reported on 02/11/2023) 90 tablet 3   traMADol (ULTRAM) 50 MG tablet Take 1 tablet (50 mg total) by mouth every 6 (six) hours as needed (prefer max 3 per day). (Patient not taking: Reported on 02/11/2023) 90 tablet 3   No current facility-administered medications for this visit.     Objective:  BP 112/70   Pulse 85   Temp 98.6 F (37 C)   Ht 5\' 11"  (1.803 m)   Wt 230 lb 12.8 oz (104.7 kg)   SpO2 90%   BMI 32.19 kg/m  Gen: NAD, resting comfortably, tearful during visit CV: RRR no murmurs rubs or gallops Lungs: CTAB no crackles, wheeze, rhonchi-denies shortness of breath Ext: no edema Skin: warm, dry     Assessment and Plan      # Depression # Generalized anxiety disorder  S: Medication:Lexapro 20Mg ,  Buspirone 5Mg  once or twice a week.  - on trazodone for sleep, mainly taking just melatonin -Also have encouraged therapist-most recently 12/26/2020  - son got bad news about his cancer and this has been very challenging    02/11/2023    4:42 PM 01/04/2023    1:38 PM 01/30/2022   12:57 PM  Depression screen PHQ 2/9  Decreased Interest 3 0 0  Down, Depressed, Hopeless 3 0 0  PHQ - 2 Score 6 0 0  Altered sleeping 3  0  Tired, decreased energy 3  0  Change in appetite 3  0  Feeling bad or failure about yourself  0  0  Trouble concentrating 3  0  Moving slowly or fidgety/restless 3  0  Suicidal thoughts 0  0  PHQ-9 Score 21  0  Difficult doing work/chores Extremely dIfficult    A/P: Situational depressed mood as well as  increased anxiety on top of baseline depression and GAD (both poorly controlled)-rather intense anxiety when thinking about the worsening of his sons health -He is already on Lexapro 20 mg and we opted to continue this That she is on buspirone 5 mg but only takes as needed-encouraged him to consider taking this more consistently but also discussed the risks of serotonin syndrome (much lower than in the past as no longer on cyclobenzaprine and very rarely taking tramadol) That she asked  for something more powerful than buspirone-discussed using Ativan even just 1/2 tablet of 0.5 mg but cannot take within 8 hours of tramadol (once again sparing) or within 8 hours of sleep since he is not using CPAP at this time-his wife agrees to monitor closely - They deny any recent falls and we discussed this can increase his fall risk-if he has any falls we will need to discontinue it -He has not wanted to pursue therapy in the past-we may need to press for this again in the future  #Neuropathy- feet #Cervical disc disease- -Patient with significant neuropathy in bilateral feet.  He is on gabapentin 300 mg 3 times a day through podiatry in the past-he prefers this over neurology visit and we now prescribed this. - Completely off nortriptyline - Very sparing tramadol not even weekly   # Overdue for labs (including for CKD stage III which we hope is stable)-encouraged him to complete these today but he declines-agrees to call back to schedule  Recommended follow up: Return in about 4 months (around 06/13/2023) for followup or sooner if needed.Schedule b4 you leave. Future Appointments  Date Time Provider Department Center  01/11/2024  1:00 PM [facility] ANNUAL WELLNESS VISIT 1 [facility]    Lab/Order associations:   ICD-10-CM   1. GAD (generalized anxiety disorder)  F41.1     2. Severe episode of recurrent major depressive disorder, without psychotic features (HCC)  F33.2     3. Stage 3 chronic kidney  disease, unspecified whether stage 3a or 3b CKD (HCC) Chronic N18.30       Meds ordered this encounter  Medications   LORazepam (ATIVAN) 0.5 MG tablet    Sig: Take 1 tablet (0.5 mg total) by mouth daily as needed for anxiety (do not take within 8 hours of bed. do not drive for 8 hours after taking.).    Dispense:  20 tablet    Refill:  0    Return precautions advised.  Tana Conch, MD

## 2023-02-11 NOTE — Telephone Encounter (Signed)
I could do 4 20 today but I will not have lab available, I will likely be running late as I anticipate my 4:00 visit being an extended visit and we will not have lab available-if we determine he needs labs he will need to come back  I cannot have him arrive later though because I need him to be there at that time for nursing to be able to room him

## 2023-02-11 NOTE — Addendum Note (Signed)
Addended by: Shelva Majestic on: 02/11/2023 06:25 PM   Modules accepted: Level of Service

## 2023-02-11 NOTE — Telephone Encounter (Signed)
See below

## 2023-02-11 NOTE — Patient Instructions (Addendum)
Trial lorazepam/ativan 0.5 mg- use just half tablet if you can. Try to use sparingly- listed as only once a day and stays in system up to 8 hours. You can still use your buspirone daily in the morning or evening or both. Also do not take within 8 hours of tramadol (glad you aren't taking much)   Continue escitalopram 20 mg as well  Consider COVID shot and flu shot in the fall-wait for new one to come out  Due for lab- call back to schedule as you wanted to hold off today  Recommended follow up: Return in about 4 months (around 06/13/2023) for followup or sooner if needed.Schedule b4 you leave.

## 2023-02-11 NOTE — Telephone Encounter (Signed)
Pt needs and appt with Dr Durene Cal for anxiety-his nerves because he received bad news about his son. He only wants to see Durene Cal and Durene Cal does not have any opening until next month and pt does not want to wait that long. Please advise.

## 2023-03-23 ENCOUNTER — Ambulatory Visit: Payer: Medicare Other

## 2023-04-13 ENCOUNTER — Other Ambulatory Visit: Payer: Self-pay | Admitting: Family Medicine

## 2023-04-30 ENCOUNTER — Other Ambulatory Visit: Payer: Self-pay | Admitting: Family Medicine

## 2023-04-30 MED ORDER — LORAZEPAM 0.5 MG PO TABS: 0.5000 mg | ORAL_TABLET | Freq: Every day | ORAL | 0 refills | Status: DC | PRN

## 2023-04-30 NOTE — Telephone Encounter (Signed)
LAST APPOINTMENT DATE: 8.15.24 NEXT APPOINTMENT DATE:11.27.24 MEDICATION:   LORazepam (ATIVAN) 0.5 MG tablet   PHARMACY:[facility] [address] [place], [place] - [address] [place]   **Let patient know to contact pharmacy at the end of the day to make sure medication is ready. **   ** Please notify patient to allow 48-72 hours to process**   **Encourage patient to contact the pharmacy for refills or they can request refills through [facility]

## 2023-05-26 ENCOUNTER — Ambulatory Visit: Payer: Medicare Other | Admitting: Family Medicine

## 2023-06-07 ENCOUNTER — Other Ambulatory Visit: Payer: Self-pay | Admitting: Family Medicine

## 2023-07-13 ENCOUNTER — Other Ambulatory Visit: Payer: Self-pay | Admitting: Adult Health

## 2023-08-03 ENCOUNTER — Ambulatory Visit: Payer: Medicare Other | Admitting: Family Medicine

## 2023-08-04 ENCOUNTER — Ambulatory Visit: Payer: Medicare Other | Admitting: Family Medicine

## 2023-08-10 ENCOUNTER — Ambulatory Visit: Payer: Medicare Other

## 2023-08-11 ENCOUNTER — Ambulatory Visit (INDEPENDENT_AMBULATORY_CARE_PROVIDER_SITE_OTHER): Payer: Medicare Other

## 2023-08-11 DIAGNOSIS — Z23 Encounter for immunization: Secondary | ICD-10-CM | POA: Diagnosis not present

## 2023-08-29 ENCOUNTER — Encounter (HOSPITAL_COMMUNITY): Payer: Self-pay | Admitting: Emergency Medicine

## 2023-08-29 ENCOUNTER — Emergency Department (HOSPITAL_COMMUNITY)

## 2023-08-29 ENCOUNTER — Inpatient Hospital Stay (HOSPITAL_COMMUNITY)
Admission: EM | Admit: 2023-08-29 | Discharge: 2023-09-07 | DRG: 871 | Disposition: A | Discharge status: Home Health | Attending: Internal Medicine | Admitting: Internal Medicine

## 2023-08-29 ENCOUNTER — Other Ambulatory Visit: Payer: Self-pay

## 2023-08-29 DIAGNOSIS — A419 Sepsis, unspecified organism: Secondary | ICD-10-CM | POA: Diagnosis not present

## 2023-08-29 DIAGNOSIS — R000 Tachycardia, unspecified: Secondary | ICD-10-CM | POA: Diagnosis not present

## 2023-08-29 DIAGNOSIS — E039 Hypothyroidism, unspecified: Secondary | ICD-10-CM | POA: Diagnosis not present

## 2023-08-29 DIAGNOSIS — Z6832 Body mass index (BMI) 32.0-32.9, adult: Secondary | ICD-10-CM

## 2023-08-29 DIAGNOSIS — J849 Interstitial pulmonary disease, unspecified: Secondary | ICD-10-CM | POA: Diagnosis present

## 2023-08-29 DIAGNOSIS — J9601 Acute respiratory failure with hypoxia: Principal | ICD-10-CM

## 2023-08-29 DIAGNOSIS — Z8051 Family history of malignant neoplasm of kidney: Secondary | ICD-10-CM

## 2023-08-29 DIAGNOSIS — Z905 Acquired absence of kidney: Secondary | ICD-10-CM | POA: Diagnosis not present

## 2023-08-29 DIAGNOSIS — Z96653 Presence of artificial knee joint, bilateral: Secondary | ICD-10-CM | POA: Diagnosis present

## 2023-08-29 DIAGNOSIS — J8410 Pulmonary fibrosis, unspecified: Secondary | ICD-10-CM | POA: Diagnosis present

## 2023-08-29 DIAGNOSIS — J449 Chronic obstructive pulmonary disease, unspecified: Secondary | ICD-10-CM | POA: Insufficient documentation

## 2023-08-29 DIAGNOSIS — J181 Lobar pneumonia, unspecified organism: Secondary | ICD-10-CM

## 2023-08-29 DIAGNOSIS — F419 Anxiety disorder, unspecified: Secondary | ICD-10-CM | POA: Diagnosis present

## 2023-08-29 DIAGNOSIS — M1990 Unspecified osteoarthritis, unspecified site: Secondary | ICD-10-CM | POA: Diagnosis present

## 2023-08-29 DIAGNOSIS — I499 Cardiac arrhythmia, unspecified: Secondary | ICD-10-CM | POA: Diagnosis not present

## 2023-08-29 DIAGNOSIS — R911 Solitary pulmonary nodule: Secondary | ICD-10-CM | POA: Diagnosis present

## 2023-08-29 DIAGNOSIS — I700 Atherosclerosis of aorta: Secondary | ICD-10-CM | POA: Diagnosis not present

## 2023-08-29 DIAGNOSIS — Z8546 Personal history of malignant neoplasm of prostate: Secondary | ICD-10-CM

## 2023-08-29 DIAGNOSIS — J441 Chronic obstructive pulmonary disease with (acute) exacerbation: Secondary | ICD-10-CM | POA: Diagnosis present

## 2023-08-29 DIAGNOSIS — J9611 Chronic respiratory failure with hypoxia: Secondary | ICD-10-CM | POA: Diagnosis not present

## 2023-08-29 DIAGNOSIS — F32A Depression, unspecified: Secondary | ICD-10-CM | POA: Diagnosis present

## 2023-08-29 DIAGNOSIS — E8770 Fluid overload, unspecified: Secondary | ICD-10-CM | POA: Diagnosis not present

## 2023-08-29 DIAGNOSIS — R0689 Other abnormalities of breathing: Secondary | ICD-10-CM | POA: Diagnosis not present

## 2023-08-29 DIAGNOSIS — Z79899 Other long term (current) drug therapy: Secondary | ICD-10-CM | POA: Diagnosis not present

## 2023-08-29 DIAGNOSIS — Z9981 Dependence on supplemental oxygen: Secondary | ICD-10-CM | POA: Diagnosis not present

## 2023-08-29 DIAGNOSIS — R6520 Severe sepsis without septic shock: Secondary | ICD-10-CM | POA: Diagnosis present

## 2023-08-29 DIAGNOSIS — E669 Obesity, unspecified: Secondary | ICD-10-CM | POA: Diagnosis present

## 2023-08-29 DIAGNOSIS — I10 Essential (primary) hypertension: Secondary | ICD-10-CM | POA: Diagnosis present

## 2023-08-29 DIAGNOSIS — J9622 Acute and chronic respiratory failure with hypercapnia: Secondary | ICD-10-CM | POA: Diagnosis not present

## 2023-08-29 DIAGNOSIS — I2510 Atherosclerotic heart disease of native coronary artery without angina pectoris: Secondary | ICD-10-CM | POA: Diagnosis not present

## 2023-08-29 DIAGNOSIS — I517 Cardiomegaly: Secondary | ICD-10-CM | POA: Diagnosis not present

## 2023-08-29 DIAGNOSIS — J9621 Acute and chronic respiratory failure with hypoxia: Secondary | ICD-10-CM | POA: Diagnosis not present

## 2023-08-29 DIAGNOSIS — Z1152 Encounter for screening for COVID-19: Secondary | ICD-10-CM

## 2023-08-29 DIAGNOSIS — Z85528 Personal history of other malignant neoplasm of kidney: Secondary | ICD-10-CM

## 2023-08-29 DIAGNOSIS — J440 Chronic obstructive pulmonary disease with acute lower respiratory infection: Secondary | ICD-10-CM | POA: Diagnosis present

## 2023-08-29 DIAGNOSIS — Z800 Family history of malignant neoplasm of digestive organs: Secondary | ICD-10-CM

## 2023-08-29 DIAGNOSIS — R0609 Other forms of dyspnea: Secondary | ICD-10-CM | POA: Diagnosis not present

## 2023-08-29 DIAGNOSIS — G4733 Obstructive sleep apnea (adult) (pediatric): Secondary | ICD-10-CM | POA: Diagnosis present

## 2023-08-29 DIAGNOSIS — J90 Pleural effusion, not elsewhere classified: Secondary | ICD-10-CM | POA: Diagnosis not present

## 2023-08-29 DIAGNOSIS — Z888 Allergy status to other drugs, medicaments and biological substances status: Secondary | ICD-10-CM

## 2023-08-29 DIAGNOSIS — R0602 Shortness of breath: Secondary | ICD-10-CM | POA: Diagnosis present

## 2023-08-29 DIAGNOSIS — Z743 Need for continuous supervision: Secondary | ICD-10-CM | POA: Diagnosis not present

## 2023-08-29 DIAGNOSIS — Z7951 Long term (current) use of inhaled steroids: Secondary | ICD-10-CM

## 2023-08-29 HISTORY — DX: Severe sepsis without septic shock: A41.9

## 2023-08-29 HISTORY — DX: Lobar pneumonia, unspecified organism: J18.1

## 2023-08-29 LAB — BRAIN NATRIURETIC PEPTIDE: B Natriuretic Peptide: 38.3 pg/mL (ref 0.0–100.0)

## 2023-08-29 LAB — CBC
HCT: 49.2 % (ref 39.0–52.0)
Hemoglobin: 15.7 g/dL (ref 13.0–17.0)
MCH: 30.2 pg (ref 26.0–34.0)
MCHC: 31.9 g/dL (ref 30.0–36.0)
MCV: 94.6 fL (ref 80.0–100.0)
Platelets: 340 10*3/uL (ref 150–400)
RBC: 5.2 MIL/uL (ref 4.22–5.81)
RDW: 14 % (ref 11.5–15.5)
WBC: 9.1 10*3/uL (ref 4.0–10.5)
nRBC: 0 % (ref 0.0–0.2)

## 2023-08-29 LAB — BASIC METABOLIC PANEL
Anion gap: 14 (ref 5–15)
BUN: 15 mg/dL (ref 8–23)
CO2: 18 mmol/L — ABNORMAL LOW (ref 22–32)
Calcium: 8.2 mg/dL — ABNORMAL LOW (ref 8.9–10.3)
Chloride: 102 mmol/L (ref 98–111)
Creatinine, Ser: 1.22 mg/dL (ref 0.61–1.24)
GFR, Estimated: 60 mL/min — ABNORMAL LOW (ref >60)
Glucose, Bld: 209 mg/dL — ABNORMAL HIGH (ref 70–99)
Potassium: 4.6 mmol/L (ref 3.5–5.1)
Sodium: 134 mmol/L — ABNORMAL LOW (ref 135–145)

## 2023-08-29 LAB — RESP PANEL BY RT-PCR (RSV, FLU A&B, COVID)  RVPGX2
Influenza A by PCR: NEGATIVE
Influenza B by PCR: NEGATIVE
Resp Syncytial Virus by PCR: NEGATIVE
SARS Coronavirus 2 by RT PCR: NEGATIVE

## 2023-08-29 LAB — I-STAT CG4 LACTIC ACID, ED
Lactic Acid, Venous: 8.2 mmol/L (ref 0.5–1.9)
Lactic Acid, Venous: 8.1 mmol/L (ref 0.5–1.9)

## 2023-08-29 LAB — COMPREHENSIVE METABOLIC PANEL
ALT: 22 U/L (ref 0–44)
AST: 30 U/L (ref 15–41)
Albumin: 2.9 g/dL — ABNORMAL LOW (ref 3.5–5.0)
Alkaline Phosphatase: 68 U/L (ref 38–126)
Anion gap: 7 (ref 5–15)
BUN: 17 mg/dL (ref 8–23)
CO2: 26 mmol/L (ref 22–32)
Calcium: 8.7 mg/dL — ABNORMAL LOW (ref 8.9–10.3)
Chloride: 102 mmol/L (ref 98–111)
Creatinine, Ser: 1.13 mg/dL (ref 0.61–1.24)
GFR, Estimated: >60 mL/min (ref >60)
Glucose, Bld: 138 mg/dL — ABNORMAL HIGH (ref 70–99)
Potassium: 3.7 mmol/L (ref 3.5–5.1)
Sodium: 135 mmol/L (ref 135–145)
Total Bilirubin: 1.1 mg/dL (ref 0.0–1.2)
Total Protein: 8.6 g/dL — ABNORMAL HIGH (ref 6.5–8.1)

## 2023-08-29 LAB — LACTIC ACID, PLASMA
Lactic Acid, Venous: 4.9 mmol/L (ref 0.5–1.9)
Lactic Acid, Venous: 1.8 mmol/L (ref 0.5–1.9)

## 2023-08-29 LAB — TROPONIN I (HIGH SENSITIVITY): Troponin I (High Sensitivity): 4 ng/L (ref <18)

## 2023-08-29 MED ORDER — ORAL CARE MOUTH RINSE: 15.0000 mL | OROMUCOSAL | Status: DC | PRN

## 2023-08-29 MED ORDER — IPRATROPIUM-ALBUTEROL 0.5-2.5 (3) MG/3ML IN SOLN: 3.0000 mL | Freq: Once | RESPIRATORY_TRACT | Status: DC

## 2023-08-29 MED ORDER — IOHEXOL 350 MG/ML SOLN
100.0000 mL | Freq: Once | INTRAVENOUS | Status: AC | PRN
  Administered 2023-08-29: 100 mL via INTRAVENOUS

## 2023-08-29 MED ORDER — ESCITALOPRAM OXALATE 20 MG PO TABS
20.0000 mg | ORAL_TABLET | Freq: Every day | ORAL | Status: DC
  Administered 2023-08-29 – 2023-09-07 (×10): 20 mg via ORAL
  Filled 2023-08-29 (×10): qty 1

## 2023-08-29 MED ORDER — TRAZODONE HCL 50 MG PO TABS
25.0000 mg | ORAL_TABLET | Freq: Every evening | ORAL | Status: DC | PRN
  Administered 2023-08-29 – 2023-09-07 (×9): 50 mg via ORAL
  Filled 2023-08-29 (×9): qty 1

## 2023-08-29 MED ORDER — LORAZEPAM 0.5 MG PO TABS
0.5000 mg | ORAL_TABLET | Freq: Every day | ORAL | Status: DC | PRN
  Administered 2023-08-29 – 2023-09-06 (×8): 0.5 mg via ORAL
  Filled 2023-08-29 (×9): qty 1

## 2023-08-29 MED ORDER — POLYETHYLENE GLYCOL 3350 17 G PO PACK: 17.0000 g | PACK | Freq: Every day | ORAL | Status: DC | PRN

## 2023-08-29 MED ORDER — SODIUM CHLORIDE 0.9 % IV SOLN
500.0000 mg | Freq: Every day | INTRAVENOUS | Status: AC
  Administered 2023-08-30 – 2023-09-02 (×4): 500 mg via INTRAVENOUS
  Filled 2023-08-29 (×4): qty 5

## 2023-08-29 MED ORDER — LACTATED RINGERS IV BOLUS (SEPSIS)
1000.0000 mL | Freq: Once | INTRAVENOUS | Status: AC
  Administered 2023-08-29: 1000 mL via INTRAVENOUS

## 2023-08-29 MED ORDER — CHLORHEXIDINE GLUCONATE CLOTH 2 % EX PADS
6.0000 | MEDICATED_PAD | Freq: Every day | CUTANEOUS | Status: DC
  Administered 2023-08-29: 6 via TOPICAL

## 2023-08-29 MED ORDER — LACTATED RINGERS IV BOLUS
1000.0000 mL | Freq: Once | INTRAVENOUS | Status: AC
  Administered 2023-08-29: 1000 mL via INTRAVENOUS

## 2023-08-29 MED ORDER — SODIUM CHLORIDE 0.9 % IV SOLN
500.0000 mg | Freq: Once | INTRAVENOUS | Status: AC
  Administered 2023-08-29: 500 mg via INTRAVENOUS
  Filled 2023-08-29: qty 5

## 2023-08-29 MED ORDER — SODIUM CHLORIDE 0.9 % IV SOLN
2.0000 g | Freq: Every day | INTRAVENOUS | Status: AC
  Administered 2023-08-30 – 2023-09-02 (×4): 2 g via INTRAVENOUS
  Filled 2023-08-29 (×3): qty 20

## 2023-08-29 MED ORDER — ACETAMINOPHEN 650 MG RE SUPP: 650.0000 mg | Freq: Four times a day (QID) | RECTAL | Status: DC | PRN

## 2023-08-29 MED ORDER — SODIUM CHLORIDE 0.9 % IV SOLN
2.0000 g | Freq: Once | INTRAVENOUS | Status: AC
  Administered 2023-08-29: 2 g via INTRAVENOUS
  Filled 2023-08-29: qty 20

## 2023-08-29 MED ORDER — LACTATED RINGERS IV BOLUS (SEPSIS)
500.0000 mL | Freq: Once | INTRAVENOUS | Status: AC
  Administered 2023-08-29: 500 mL via INTRAVENOUS

## 2023-08-29 MED ORDER — MOMETASONE FURO-FORMOTEROL FUM 100-5 MCG/ACT IN AERO
2.0000 | INHALATION_SPRAY | Freq: Two times a day (BID) | RESPIRATORY_TRACT | Status: DC
  Administered 2023-08-29 – 2023-08-30 (×2): 2 via RESPIRATORY_TRACT
  Filled 2023-08-29: qty 8.8

## 2023-08-29 MED ORDER — ENOXAPARIN SODIUM 40 MG/0.4ML IJ SOSY
40.0000 mg | PREFILLED_SYRINGE | Freq: Every day | INTRAMUSCULAR | Status: DC
  Administered 2023-08-29 – 2023-09-07 (×10): 40 mg via SUBCUTANEOUS
  Filled 2023-08-29 (×10): qty 0.4

## 2023-08-29 MED ORDER — TRAMADOL HCL 50 MG PO TABS: 50.0000 mg | ORAL_TABLET | Freq: Four times a day (QID) | ORAL | Status: DC | PRN

## 2023-08-29 MED ORDER — ALBUTEROL SULFATE (2.5 MG/3ML) 0.083% IN NEBU
10.0000 mg/h | INHALATION_SOLUTION | Freq: Once | RESPIRATORY_TRACT | Status: AC
  Administered 2023-08-29: 10 mg/h via RESPIRATORY_TRACT
  Filled 2023-08-29: qty 12

## 2023-08-29 MED ORDER — LACTATED RINGERS IV SOLN
150.0000 mL/h | INTRAVENOUS | Status: AC
  Administered 2023-08-29 (×2): 150 mL/h via INTRAVENOUS

## 2023-08-29 MED ORDER — ONDANSETRON HCL 4 MG PO TABS: 4.0000 mg | ORAL_TABLET | Freq: Four times a day (QID) | ORAL | Status: DC | PRN

## 2023-08-29 MED ORDER — ONDANSETRON HCL 4 MG/2ML IJ SOLN: 4.0000 mg | Freq: Four times a day (QID) | INTRAMUSCULAR | Status: DC | PRN

## 2023-08-29 MED ORDER — ACETAMINOPHEN 325 MG PO TABS: 650.0000 mg | ORAL_TABLET | Freq: Four times a day (QID) | ORAL | Status: DC | PRN

## 2023-08-29 MED ORDER — GABAPENTIN 300 MG PO CAPS
300.0000 mg | ORAL_CAPSULE | Freq: Three times a day (TID) | ORAL | Status: DC
  Administered 2023-08-29 – 2023-09-07 (×27): 300 mg via ORAL
  Filled 2023-08-29 (×27): qty 1

## 2023-08-29 MED ORDER — ALBUTEROL SULFATE (2.5 MG/3ML) 0.083% IN NEBU
2.5000 mg | INHALATION_SOLUTION | Freq: Four times a day (QID) | RESPIRATORY_TRACT | Status: DC | PRN
  Administered 2023-08-31 – 2023-09-05 (×4): 2.5 mg via RESPIRATORY_TRACT
  Filled 2023-08-29 (×4): qty 3

## 2023-08-29 NOTE — Plan of Care (Signed)
  Problem: Fluid Volume: Goal: Hemodynamic stability will improve Outcome: Progressing   Problem: Respiratory: Goal: Ability to maintain adequate ventilation will improve Outcome: Progressing   Problem: Education: Goal: Knowledge of General Education information will improve Description: Including pain rating scale, medication(s)/side effects and non-pharmacologic comfort measures Outcome: Progressing   

## 2023-08-29 NOTE — ED Provider Notes (Signed)
 [place] EMERGENCY DEPARTMENT AT [facility] Provider Note   CSN: [id number] Arrival date & time: 08/29/23  0257     History  Chief Complaint  Patient presents with   Shortness of Breath    Wayne Booth is a 83 y.o. male.  The history is provided by the patient.   Patient with history of pulmonary fibrosis, chronic respiratory failure on home oxygen (5L) reports acute on chronic shortness of breath.  Patient reports over the past day has had increasing shortness of breath No fevers, no cough.  He did have an episode of chest pain yesterday, none at this time. No vomiting is reported  EMS was called who brought in from home, he was given nebulizers, Solu-Medrol and magnesium Past Medical History:  Diagnosis Date   Allergy    States his nose runs when he is around grease.    Anxiety    Arthritis    Atrial fibrillation (HCC)    Cancer (HCC)    Coronary artery disease    History of total knee replacement    Hypertension    Hypothyroidism    Kidney cancer, primary, with metastasis from kidney to other site [facility])    Kidney stone    OSA (obstructive sleep apnea)    pt does not wear cpap at night   Pituitary cyst (HCC)    Pneumonia    hx of   Prostate cancer (HCC)     Home Medications Prior to Admission medications   Medication Sig Start Date End Date Taking? Authorizing Provider  albuterol (PROVENTIL) (2.5 MG/3ML) 0.083% nebulizer solution Take 3 mLs (2.5 mg total) by nebulization every 6 (six) hours as needed for wheezing or shortness of breath. 10/12/22   Parrett, Virgel Bouquet, NP  albuterol (VENTOLIN HFA) 108 (90 Base) MCG/ACT inhaler INHALE 2 PUFFS BY MOUTH EVERY 6 HOURS AS NEEDED FOR WHEEZING OR SHORTNESS OF BREATH 07/13/23   Parrett, Virgel Bouquet, NP  Budeson-Glycopyrrol-Formoterol (BREZTRI AEROSPHERE) 160-9-4.8 MCG/ACT AERO Inhale into the lungs.    [provider]  busPIRone (BUSPAR) 5 MG tablet TAKE ONE TABLET BY MOUTH TWICE A DAY AS NEEDED FOR  ANXIETY Patient not taking: Reported on 02/11/2023 01/18/23   Shelva Majestic, MD  escitalopram (LEXAPRO) 20 MG tablet TAKE 1 TABLET BY MOUTH DAILY 06/07/23   Shelva Majestic, MD  gabapentin (NEURONTIN) 300 MG capsule Take 1 capsule (300 mg total) by mouth 3 (three) times daily. 12/25/22   Shelva Majestic, MD  LORazepam (ATIVAN) 0.5 MG tablet Take 1 tablet (0.5 mg total) by mouth daily as needed for anxiety (do not take within 8 hours of bed. do not drive for 8 hours after taking.). 04/30/23   Shelva Majestic, MD  traMADol (ULTRAM) 50 MG tablet Take 1 tablet (50 mg total) by mouth every 6 (six) hours as needed (prefer max 3 per day). Patient not taking: Reported on 02/11/2023 01/30/22   Ardith Dark, MD  traZODone (DESYREL) 50 MG tablet Take 0.5-1 tablets (25-50 mg total) by mouth at bedtime as needed for sleep. 12/25/22   Shelva Majestic, MD      Allergies    Ambien [zolpidem]    Review of Systems   Review of Systems  Constitutional:  Negative for fever.  Respiratory:  Positive for shortness of breath. Negative for cough.   Cardiovascular:  Positive for chest pain.  Gastrointestinal:  Negative for vomiting.    Physical Exam Updated Vital Signs BP (!) 103/56  Pulse (!) 115   Temp 98.3 F (36.8 C) (Rectal)   Resp (!) 40   SpO2 100%  Physical Exam CONSTITUTIONAL: Chronically ill-appearing, tachypneic HEAD: Normocephalic/atraumatic EYES: EOMI/PERRL ENMT: Mucous membranes moist, no angioedema or stridor NECK: supple no meningeal signs CV: S1/S2 noted, no murmurs/rubs/gallops noted LUNGS: Crackles noted mostly on the right, tachypneic ABDOMEN: soft, nontender NEURO: Pt is awake/alert/appropriate, moves all extremitiesx4.  No facial droop.   EXTREMITIES: pulses normal/equal, full ROM, no lower extremity edema SKIN: warm, color normal PSYCH: Anxious ED Results / Procedures / Treatments   Labs (all labs ordered are listed, but only abnormal results are displayed) Labs  Reviewed  COMPREHENSIVE METABOLIC PANEL - Abnormal; Notable for the following components:      Result Value   Glucose, Bld 138 (*)    Calcium 8.7 (*)    Total Protein 8.6 (*)    Albumin 2.9 (*)    All other components within normal limits  RESP PANEL BY RT-PCR (RSV, FLU A&B, COVID)  RVPGX2  CBC  BRAIN NATRIURETIC PEPTIDE  TROPONIN I (HIGH SENSITIVITY)    EKG EKG Interpretation Date/Time:  Sunday August 29 2023 03:29:03 EST Ventricular Rate:  87 PR Interval:  196 QRS Duration:  106 QT Interval:  344 QTC Calculation: 414 R Axis:   -17  Text Interpretation: rhythm indeterminate Probable left atrial enlargement Borderline left axis deviation Low voltage, precordial leads Borderline T abnormalities, lateral leads Interpretation limited secondary to artifact Confirmed by Zadie Rhine ([id number]) on 08/29/2023 3:40:40 AM  Radiology DG Chest Port 1 View Result Date: 08/29/2023 CLINICAL DATA:  Shortness of breath EXAM: PORTABLE CHEST 1 VIEW COMPARISON:  07/17/2022 FINDINGS: Cardiac shadow is enlarged but stable. Patchy airspace opacities are again identified as well as small pleural effusions. No new focal infiltrate is seen. No bony abnormality is noted. IMPRESSION: Stable appearance of the chest when compared with the prior exam. Electronically Signed   By: Alcide Clever M.D.   On: 08/29/2023 03:53    Procedures .Critical Care  Performed by: Zadie Rhine, MD Authorized by: Zadie Rhine, MD   Critical care provider statement:    Critical care time (minutes):  45   Critical care start time:  08/29/2023 6:00 AM   Critical care end time:  08/29/2023 6:45 AM   Critical care time was exclusive of:  Separately billable procedures and treating other patients   Critical care was necessary to treat or prevent imminent or life-threatening deterioration of the following conditions:  Respiratory failure and sepsis   Critical care was time spent personally by me on the following activities:   Obtaining history from patient or surrogate, examination of patient, development of treatment plan with patient or surrogate, ordering and review of radiographic studies, ordering and review of laboratory studies, pulse oximetry, ordering and performing treatments and interventions, re-evaluation of patient's condition, review of old charts and evaluation of patient's response to treatment   I assumed direction of critical care for this patient from another provider in my specialty: no       Medications Ordered in ED Medications  iohexol (OMNIPAQUE) 350 MG/ML injection 100 mL (has no administration in time range)  albuterol (PROVENTIL) (2.5 MG/3ML) 0.083% nebulizer solution (10 mg/hr Nebulization Given 08/29/23 0428)    ED Course/ Medical Decision Making/ A&P Clinical Course as of 08/29/23 0722  Sun Aug 29, 2023  0528 Patient with known history of pulmonary fibrosis and COPD, on chronic oxygen around 5 L reports increased shortness of breath over the  past week.  I spoke to his wife via the phone and she reports he is noticeably out of breath over the past week but no other real symptoms.  He is currently getting neb treatment, will reassess [DW]  [phone] Patient still reporting shortness of breath, his oxygen requirements have been escalated.  He is tachycardic.  Will obtain CT chest to evaluate for pulmonary embolism [DW]  0722 Vital signs are improving, work of breathing improving. Plan for CT imaging and admit Signed out to dr pickering at shift change [DW]    Clinical Course User Index [DW] Zadie Rhine, MD                                 Medical Decision Making Amount and/or Complexity of Data Reviewed Labs: ordered. Radiology: ordered.  Risk Prescription drug management.   This patient presents to the ED for concern of shortness of breath, this involves an extensive number of treatment options, and is a complaint that carries with it a high risk of complications and morbidity.   The differential diagnosis includes but is not limited to Acute coronary syndrome, pneumonia, acute pulmonary edema, pneumothorax, acute anemia, pulmonary embolism   Comorbidities that complicate the patient evaluation: Patient's presentation is complicated by their history of pulmonary fibrosis  Social Determinants of Health: Patient's  poor mobility   increases the complexity of managing their presentation  Additional history obtained: Records reviewed  outpatient records reviewed  Lab Tests: I Ordered, and personally interpreted labs.  The pertinent results include: Labs overall unremarkable  Imaging Studies ordered: I ordered imaging studies including X-ray chest   I independently visualized and interpreted imaging which showed chronic changes noted, no acute findings I agree with the radiologist interpretation  Cardiac Monitoring: The patient was maintained on a cardiac monitor.  I personally viewed and interpreted the cardiac monitor which showed an underlying rhythm of:   Tachycardia  Medicines ordered and prescription drug management: I ordered medication including nebulizer treatment for shortness of breath Reevaluation of the patient after these medicines showed that the patient    stayed the same   Critical Interventions:   nebulized therapy, high flow oxygen   Reevaluation: After the interventions noted above, I reevaluated the patient and found that they have :stayed the same  Complexity of problems addressed: Patient's presentation is most consistent with  acute presentation with potential threat to life or bodily function  Disposition: After consideration of the diagnostic results and the patient's response to treatment,  I feel that the patent would benefit from admission   .           Final Clinical Impression(s) / ED Diagnoses Final diagnoses:  Acute respiratory failure with hypoxia (HCC)  COPD exacerbation (HCC)    Rx / DC Orders ED  Discharge Orders     None         Zadie Rhine, MD 08/29/23 [phone]

## 2023-08-29 NOTE — Hospital Course (Addendum)
 Wayne Booth

## 2023-08-29 NOTE — Sepsis Progress Note (Signed)
 Sepsis protocol monitored by eLink ?

## 2023-08-29 NOTE — H&P (Signed)
 History and Physical    Patient: Wayne Booth:[id number] DOB: [birth year] DOA: 08/29/2023 DOS: the patient was seen and examined on 08/29/2023 PCP: Shelva Majestic, MD  Patient coming from: Home w/ wife  Chief Complaint:  Chief Complaint  Patient presents with   Shortness of Breath   HPI:  83 year old man PMH COPD, ILD, chronic hypoxic respiratory failure on 5 L, OSA, presented with worsening shortness of breath over the last several days.  Admitted for severe sepsis secondary to multilobar pneumonia.  He has had increasing shortness of breath over the last month with it much worse over the last several days.  Aggravated by minimal activity.  No specific alleviating factors.  No illnesses noted.  No pain.  Last seen by pulmonary medicine almost 1 year ago.  Review of Systems: Negative for fever, visual changes, sore throat, rash, chest pain, new muscle aches, dysuria, bleeding, nausea  Past Medical History:  Diagnosis Date   Allergy    States his nose runs when he is around grease.    Anxiety    Arthritis    Atrial fibrillation (HCC)    Cancer (HCC)    Coronary artery disease    History of total knee replacement    Hypertension    Hypothyroidism    Kidney cancer, primary, with metastasis from kidney to other site [facility])    Kidney stone    OSA (obstructive sleep apnea)    pt does not wear cpap at night   Pituitary cyst (HCC)    Pneumonia    hx of   Prostate cancer [facility])    Past Surgical History:  Procedure Laterality Date   ABLATION OF DYSRHYTHMIC FOCUS  01/28/2018   ANTERIOR CERVICAL DECOMP/DISCECTOMY FUSION N/A 08/29/2012   Procedure: ANTERIOR CERVICAL DECOMPRESSION/DISCECTOMY FUSION 1 LEVEL;  Surgeon: Tia Alert, MD;  Location: [facility] NEURO ORS;  Service: Neurosurgery;  Laterality: N/A;   APPENDECTOMY     ATRIAL FIBRILLATION ABLATION N/A 01/28/2018   Procedure: ATRIAL FIBRILLATION ABLATION;  Surgeon: Regan Lemming, MD;  Location: [facility] INVASIVE CV LAB;  Service:  Cardiovascular;  Laterality: N/A;   CARDIAC CATHETERIZATION  2008   clean   CHOLECYSTECTOMY     COLONOSCOPY W/ POLYPECTOMY     CRANIOTOMY N/A 11/08/2012   Procedure: CRANIOTOMY HYPOPHYSECTOMY TRANSNASAL APPROACH;  Surgeon: [name], MD;  Location: [facility] NEURO ORS;  Service: Neurosurgery;  Laterality: N/A;  Transphenoidal Resection of Pituitary Tumor   FINGER SURGERY Left 2017   Dr Amanda Pea   INSERTION PROSTATE RADIATION SEED     IR THORACENTESIS ASP PLEURAL SPACE W/IMG GUIDE  04/15/2018   KIDNEY STONE SURGERY     KNEE ARTHROSCOPY  2007   left   NEPHRECTOMY Left    POSTERIOR CERVICAL LAMINECTOMY Left 04/24/2015   Procedure: Foraminotomy cervical five - cervical six cervical six - cervical seven left;  Surgeon: Tia Alert, MD;  Location: [facility] NEURO ORS;  Service: Neurosurgery;  Laterality: Left;   REPLACEMENT TOTAL KNEE BILATERAL     RIGHT/LEFT HEART CATH AND CORONARY ANGIOGRAPHY N/A 07/14/2019   Procedure: RIGHT/LEFT HEART CATH AND CORONARY ANGIOGRAPHY;  Surgeon: Corky Crafts, MD;  Location: [facility] INVASIVE CV LAB;  Service: Cardiovascular;  Laterality: N/A;   Social History:  reports that he has never smoked. He has never used smokeless tobacco. He reports current alcohol use of about 2.0 standard drinks of alcohol per week. He reports that he does not use drugs.  Allergies  Allergen Reactions   Ambien [  Zolpidem] Anxiety    Anxiety and agitation when tried as sleeper in hospital aug 2019    Family History  Problem Relation Age of Onset   Cancer Father        ?mouth, died before patient born   Cancer Mother        stomach, died when he was young   Kidney cancer Sister    Colon cancer Brother 66   Esophageal cancer Neg Hx    Rectal cancer Neg Hx    Stomach cancer Neg Hx     Prior to Admission medications   Medication Sig Start Date End Date Taking? Authorizing Provider  albuterol (PROVENTIL) (2.5 MG/3ML) 0.083% nebulizer solution Take 3 mLs (2.5 mg total) by  nebulization every 6 (six) hours as needed for wheezing or shortness of breath. 10/12/22   Parrett, Virgel Bouquet, NP  albuterol (VENTOLIN HFA) 108 (90 Base) MCG/ACT inhaler INHALE 2 PUFFS BY MOUTH EVERY 6 HOURS AS NEEDED FOR WHEEZING OR SHORTNESS OF BREATH 07/13/23   Parrett, Virgel Bouquet, NP  Budeson-Glycopyrrol-Formoterol (BREZTRI AEROSPHERE) 160-9-4.8 MCG/ACT AERO Inhale into the lungs.    [provider]  busPIRone (BUSPAR) 5 MG tablet TAKE ONE TABLET BY MOUTH TWICE A DAY AS NEEDED FOR ANXIETY Patient not taking: Reported on 02/11/2023 01/18/23   Shelva Majestic, MD  escitalopram (LEXAPRO) 20 MG tablet TAKE 1 TABLET BY MOUTH DAILY 06/07/23   Shelva Majestic, MD  gabapentin (NEURONTIN) 300 MG capsule Take 1 capsule (300 mg total) by mouth 3 (three) times daily. 12/25/22   Shelva Majestic, MD  LORazepam (ATIVAN) 0.5 MG tablet Take 1 tablet (0.5 mg total) by mouth daily as needed for anxiety (do not take within 8 hours of bed. do not drive for 8 hours after taking.). 04/30/23   Shelva Majestic, MD  traMADol (ULTRAM) 50 MG tablet Take 1 tablet (50 mg total) by mouth every 6 (six) hours as needed (prefer max 3 per day). Patient not taking: Reported on 02/11/2023 01/30/22   Ardith Dark, MD  traZODone (DESYREL) 50 MG tablet Take 0.5-1 tablets (25-50 mg total) by mouth at bedtime as needed for sleep. 12/25/22   Shelva Majestic, MD    Physical Exam: Vitals:   08/29/23 0651 08/29/23 0700 08/29/23 0830 08/29/23 0900  BP:  (!) 96/54 120/67 108/72  Pulse:  (!) 37 (!) 116 (!) 115  Resp:  (!) 25 20 (!) 32  Temp: 98.3 F (36.8 C)     TempSrc: Rectal     SpO2:  100% 95% 97%   Physical Exam Vitals reviewed.  Constitutional:      General: He is not in acute distress.    Appearance: He is ill-appearing. He is not toxic-appearing.  HENT:     Mouth/Throat:     Mouth: Mucous membranes are moist.     Pharynx: No oropharyngeal exudate.  Eyes:     Comments: Eyes appear grossly normal  Cardiovascular:      Rate and Rhythm: Normal rate and regular rhythm.     Heart sounds: No murmur heard. Pulmonary:     Effort: No respiratory distress.     Breath sounds: Wheezing present. No rhonchi or rales.     Comments: Able to speak in short sentences. Abdominal:     General: There is no distension.     Palpations: Abdomen is soft.     Tenderness: There is no abdominal tenderness.  Musculoskeletal:     Right lower leg: No edema.  Left lower leg: No edema.  Neurological:     General: No focal deficit present.     Mental Status: He is alert.  Psychiatric:        Mood and Affect: Mood normal.        Behavior: Behavior normal.     Data Reviewed: CMP unremarkable Troponin and BNP within normal limits CBC within normal limits Flu, RSV, COVID-negative EKG sinus rhythm with PACs Chest x-ray no acute changes, chronic findings CT noted Lactic acid 8.2  Assessment and Plan: Severe sepsis  Multilobar pneumonia superimposed on advanced lung disease CT showed no PE.  Chronic fibrotic interstitial lung disease.  Multifocal bilateral consolidative change multiple lobes. SBP over 100 with some lows earlier.  Respiratory rate in the high 20s.  Able to speak in full sentences. Lactic acid of 8 does not correlate with his clinical appearance Initiate sepsis protocol, discussed with the EDP.  Will upgrade to stepdown care.  Empiric antibiotics, bolus, fluids etc.  Chronic hypoxic respiratory failure on 5 L at home Pulmonary fibrosis/ILD OSA Last seen by Dr. Vassie Loll 10/26/22 Will ask for pulmonary consultation 3/3 Bronchodilators  Right midlung nodule 1.3 cm Outpatient follow-up with pulmonology  Aortic atherosclerosis   Advance Care Planning: Full.  Confirmed with patient.  Consults: Pulmonary 3/3  Family Communication: None  Severity of Illness: The appropriate patient status for this patient is INPATIENT. Inpatient status is judged to be reasonable and necessary in order to provide the  required intensity of service to ensure the patient's safety. The patient's presenting symptoms, physical exam findings, and initial radiographic and laboratory data in the context of their chronic comorbidities is felt to place them at high risk for further clinical deterioration. Furthermore, it is not anticipated that the patient will be medically stable for discharge from the hospital within 2 midnights of admission.   * I certify that at the point of admission it is my clinical judgment that the patient will require inpatient hospital care spanning beyond 2 midnights from the point of admission due to high intensity of service, high risk for further deterioration and high frequency of surveillance required.*  Author: Brendia Sacks, MD 08/29/2023 9:42 AM  For on call review [email or web address].

## 2023-08-29 NOTE — ED Triage Notes (Signed)
 PT BIBA from home, wife called d/t severe SHOB. Pt has hx of COPD, chronically on 5L. Has been getting worse the past 3 wks. Pt received duoneb x2, 125 mg solumedrol, and 2g mag en route. Pt is working to breathe on arrival, switched to non-rebreather at [employer]

## 2023-08-29 NOTE — ED Provider Notes (Addendum)
  Physical Exam  BP (!) 96/54   Pulse (!) 37   Temp 98.3 F (36.8 C) (Rectal)   Resp (!) 25   SpO2 100%   Physical Exam  Procedures  Procedures  ED Course / MDM   Clinical Course as of 08/29/23 0827  Wynelle Link Aug 29, 2023  0528 Patient with known history of pulmonary fibrosis and COPD, on chronic oxygen around 5 L reports increased shortness of breath over the past week.  I spoke to his wife via the phone and she reports he is noticeably out of breath over the past week but no other real symptoms.  He is currently getting neb treatment, will reassess [DW]  [phone] Patient still reporting shortness of breath, his oxygen requirements have been escalated.  He is tachycardic.  Will obtain CT chest to evaluate for pulmonary embolism [DW]  0722 Vital signs are improving, work of breathing improving. Plan for CT imaging and admit Signed out to dr Alyssha Housh at shift change [DW]    Clinical Course User Index [DW] Zadie Rhine, MD   Medical Decision Making Amount and/or Complexity of Data Reviewed Labs: ordered. Radiology: ordered.  Risk Prescription drug management. Decision regarding hospitalization.   Received in signout.  History of pulmonary fibrosis COPD with worsening shortness of breath.  Chronic 5 L of oxygen.  CT scan shows multifocal pneumonia without PE.  Will admit.  Patient does have some hypotension.  States his blood pressure normally is good.  However reviewing notes does have some blood pressure episodes when they are in the 100s.  Will give fluid bolus.  Will get blood cultures and lactate now.  Will admit.  Lactic acid returned now at 8.  Discussed with Dr. Irene Limbo who is admitting.  Will give more fluid bolus at this time for potential sepsis, although could als be due to hypoxia and work of breathing.       Benjiman Core, MD 08/29/23 Libby Maw    Benjiman Core, MD 08/29/23 [id number]    Benjiman Core, MD 08/29/23 [phone]

## 2023-08-30 DIAGNOSIS — A419 Sepsis, unspecified organism: Secondary | ICD-10-CM | POA: Diagnosis not present

## 2023-08-30 DIAGNOSIS — J849 Interstitial pulmonary disease, unspecified: Secondary | ICD-10-CM | POA: Diagnosis not present

## 2023-08-30 DIAGNOSIS — G4733 Obstructive sleep apnea (adult) (pediatric): Secondary | ICD-10-CM | POA: Diagnosis not present

## 2023-08-30 DIAGNOSIS — J9621 Acute and chronic respiratory failure with hypoxia: Secondary | ICD-10-CM | POA: Diagnosis not present

## 2023-08-30 DIAGNOSIS — J9611 Chronic respiratory failure with hypoxia: Secondary | ICD-10-CM | POA: Diagnosis not present

## 2023-08-30 LAB — RESPIRATORY PANEL BY PCR

## 2023-08-30 LAB — CBC
HCT: 41.8 % (ref 39.0–52.0)
Hemoglobin: 13.2 g/dL (ref 13.0–17.0)
MCH: 30.1 pg (ref 26.0–34.0)
MCHC: 31.6 g/dL (ref 30.0–36.0)
MCV: 95.4 fL (ref 80.0–100.0)
Platelets: 277 10*3/uL (ref 150–400)
RBC: 4.38 MIL/uL (ref 4.22–5.81)
RDW: 14.2 % (ref 11.5–15.5)
WBC: 15.6 10*3/uL — ABNORMAL HIGH (ref 4.0–10.5)
nRBC: 0 % (ref 0.0–0.2)

## 2023-08-30 LAB — BASIC METABOLIC PANEL
Anion gap: 5 (ref 5–15)
BUN: 18 mg/dL (ref 8–23)
CO2: 25 mmol/L (ref 22–32)
Calcium: 8.2 mg/dL — ABNORMAL LOW (ref 8.9–10.3)
Chloride: 107 mmol/L (ref 98–111)
Creatinine, Ser: 0.97 mg/dL (ref 0.61–1.24)
GFR, Estimated: >60 mL/min (ref >60)
Glucose, Bld: 94 mg/dL (ref 70–99)
Potassium: 4.7 mmol/L (ref 3.5–5.1)
Sodium: 137 mmol/L (ref 135–145)

## 2023-08-30 LAB — PROTIME-INR
INR: 1.2 (ref 0.8–1.2)
Prothrombin Time: 15.4 s — ABNORMAL HIGH (ref 11.4–15.2)

## 2023-08-30 LAB — EXPECTORATED SPUTUM ASSESSMENT W GRAM STAIN, RFLX TO RESP C

## 2023-08-30 LAB — STREP PNEUMONIAE URINARY ANTIGEN: Strep Pneumo Urinary Antigen: NEGATIVE

## 2023-08-30 LAB — CORTISOL-AM, BLOOD: Cortisol - AM: 1.1 ug/dL — ABNORMAL LOW (ref 6.7–22.6)

## 2023-08-30 LAB — PROCALCITONIN: Procalcitonin: <0.1 ng/mL

## 2023-08-30 MED ORDER — REVEFENACIN 175 MCG/3ML IN SOLN
175.0000 ug | Freq: Every day | RESPIRATORY_TRACT | Status: DC
  Administered 2023-08-30 – 2023-09-07 (×9): 175 ug via RESPIRATORY_TRACT
  Filled 2023-08-30 (×9): qty 3

## 2023-08-30 MED ORDER — METHYLPREDNISOLONE SODIUM SUCC 40 MG IJ SOLR
40.0000 mg | Freq: Every day | INTRAMUSCULAR | Status: AC
  Administered 2023-08-30 – 2023-09-02 (×4): 40 mg via INTRAVENOUS
  Filled 2023-08-30 (×4): qty 1

## 2023-08-30 MED ORDER — ARFORMOTEROL TARTRATE 15 MCG/2ML IN NEBU
15.0000 ug | INHALATION_SOLUTION | Freq: Two times a day (BID) | RESPIRATORY_TRACT | Status: DC
  Administered 2023-08-30 – 2023-09-07 (×17): 15 ug via RESPIRATORY_TRACT
  Filled 2023-08-30 (×17): qty 2

## 2023-08-30 MED ORDER — BUDESONIDE 0.25 MG/2ML IN SUSP
0.2500 mg | Freq: Two times a day (BID) | RESPIRATORY_TRACT | Status: DC
  Administered 2023-08-30 – 2023-09-07 (×17): 0.25 mg via RESPIRATORY_TRACT
  Filled 2023-08-30 (×17): qty 2

## 2023-08-30 NOTE — Progress Notes (Signed)
  Progress Note   Patient: Wayne Booth HKV:[id number] DOB: April 02, 1941 DOA: 08/29/2023     1 DOS: the patient was seen and examined on 08/30/2023   Brief hospital course: 83 year old man PMH COPD, ILD, chronic hypoxic respiratory failure on 5 L, OSA, presented with worsening shortness of breath over the last several days. Admitted for severe sepsis secondary to multilobar pneumonia.   Consultants Pulmonology  Procedures/Events   Assessment and Plan: Severe sepsis  Multilobar pneumonia superimposed on advanced lung disease CT showed no PE.  Chronic fibrotic interstitial lung disease.  Multifocal bilateral consolidative change multiple lobes. Lactic acid was over 8 on admission and only gradually trended back to normal with aggressive fluids.  Never correlated with clinical condition He appears much better today.  Continue empiric antibiotics.   Chronic hypoxic respiratory failure on 5 L at home Pulmonary fibrosis/ILD OSA Last seen by Dr. Vassie Loll 10/26/22 Chronic issues appear relatively stable.  I have asked pulmonary medicine to see today to provide any further guidance and recommendations given his severe presentation.   Right midlung nodule 1.3 cm Outpatient follow-up with pulmonology   Aortic atherosclerosis      Subjective:  Feels better than yesterday.  Breathing better but not back to baseline.  Physical Exam: Vitals:   08/30/23 0900 08/30/23 0944 08/30/23 1000 08/30/23 1100  BP:   (!) 121/58 107/82  Pulse: 69  (!) 120 (!) 126  Resp: (!) 23  17 (!) 24  Temp:      TempSrc:      SpO2: 92% 95% 94% 94%  Weight:      Height:       Physical Exam Vitals reviewed.  Constitutional:      General: He is not in acute distress.    Appearance: He is not ill-appearing or toxic-appearing.  Cardiovascular:     Rate and Rhythm: Normal rate and regular rhythm.     Heart sounds: No murmur heard. Pulmonary:     Effort: No respiratory distress.     Breath sounds: No wheezing,  rhonchi or rales.     Comments: Mild increased respiratory effort. Musculoskeletal:     Right lower leg: No edema.     Left lower leg: No edema.  Neurological:     Mental Status: He is alert.  Psychiatric:        Mood and Affect: Mood normal.        Behavior: Behavior normal.     Data Reviewed: Lactic acid down to 1.8 BMP noted  Family Communication:   Disposition: Status is: Inpatient Remains inpatient appropriate because: Severe sepsis, lobar pneumonia     Time spent: 20 minutes  Author: Brendia Sacks, MD 08/30/2023 11:38 AM  For on call review [email or web address].

## 2023-08-30 NOTE — Progress Notes (Signed)
 Pt states he wears his oxygen at not and don't wish to wear CPAP.

## 2023-08-30 NOTE — Consult Note (Signed)
 NAME:  Wayne Booth, MRN:  [id number], DOB:  [birth year], LOS: 1 ADMISSION DATE:  08/29/2023, CONSULTATION DATE:  3/3 REFERRING MD:  Irene Limbo, CHIEF COMPLAINT:  hypoxic respiratory failure   History of Present Illness:  83 year old male with past medical hisotry of atrial fibrillation, CAD, HTN, OSA, chronic hypoxic respiratory failure, pulmonary fibrosis/ILD on 5LNC home O2 who presented to ED on 3/3 with shortness of breath. In ED, reporting increased work of breathing over the past 1 week. Presented tachycardic, afebrile, tachypneic. COVID/flu/RSV negative, BNP 38, troponin 4, WBC 9.1, CMP grossly normal. Blood cultures drawn. Initial lactic 8.1>4.9. He had CTA PE study negative for pulmonary embolism. Demonstrated known ILD with likely superimposed multifocal pneumonia. On 3/3 pulmonary consulted for assistance.   On my exam, older male, pleasant. He is objectively dyspneic but not in extremis. On 6LNC. He subjectively feels short of breath. Denies new or different cough, fever or chills, decreased appetite, N/V/D, sick contacts. He denies chest pain or palpitations. He states he wears his oxygen "most of the time." Does not wear a CPAP. Has not seen pulmonologist since 09/2022  Pertinent  Medical History  atrial fibrillation, CAD, HTN, OSA, chronic hypoxic respiratory failure, pulmonary fibrosis/ILD on 5LNC home O2  Significant Hospital Events: Including procedures, antibiotic start and stop dates in addition to other pertinent events   3/2: admit to trh for sob  3/3: pulm consult  Interim History / Subjective:  Pleasant male. He is dyspneic without extremis. He is on [facility] Symptoms worsening over 1 week. Feels like he would not be able to walk to his mailbox which he could previously do. Got winded just sitting up to auscultate posterior lung fields.   Objective   Blood pressure 132/86, pulse 68, temperature 97.6 F (36.4 C), temperature source Oral, resp. rate (!) 25, height 5\' 11"   (1.803 m), weight 106 kg, SpO2 95%.        Intake/Output Summary (Last 24 hours) at 08/30/2023 1051 Last data filed at 08/30/2023 0915 Gross per 24 hour  Intake 6201.58 ml  Output 2050 ml  Net 4151.58 ml   Filed Weights   08/29/23 1354  Weight: 106 kg    Examination: General: elderly male, acute on chronically ill appearing, sitting in bed, not in extremis HENT: [place]/at, mucous membranes moist, nasal cannula  Lungs: decreased air movement bilaterally, wheezing bilateral lower lung fields, faint crackles Cardiovascular: irregularly irregular rhythm, no appreciable murmur, rub, gallop Abdomen: rounded, non tender Extremities: non pitting edema Neuro: non focal, alert, oriented  GU: no foley  Resolved Hospital Problem list     Assessment & Plan:  Acute on chronic hypoxic respiratory failure  ILD/Pulmonary Fibrosis on 5LNC home O2 OSA not on CPAP  5 days worsening dyspnea. Does not feel he could walk to his mailbox which he normally does. Dyspneic at rest. No infectious symptoms at home. On 6LNC on admission. CTA PE negative for embolism. Does show chronic ILD with possible overlying multifocal pneumonia. Trop negative. BNP wnl. Covid/rsv/flu negative. - send RVP 20 pathogen panel, legionella, strep pna  - f/u procal  - f/u sputum culture  - ordered echo  - add triple therapy with brovana/pulmicort/yupelri  - add solu-medrol 40mg  daily  - con't CAP coverage which is appropriate for now  - will need outpatient pulm follow-up scheduled - pulm will follow   Below per primary Atrial fibrillation  CAD HTN Best Practice (right click and "Reselect all SmartList Selections" daily)  Below per primary  Diet/type:  DVT prophylaxis:  Pressure ulcer(s).  GI prophylaxis:  Lines:  Foley:   Code Status:   Last date of multidisciplinary goals of care discussion []   Labs   CBC: Recent Labs  Lab 08/29/23 0326 08/30/23 0309  WBC 9.1 15.6*  HGB 15.7 13.2  HCT 49.2 41.8  MCV 94.6  95.4  PLT 340 277    Basic Metabolic Panel: Recent Labs  Lab 08/29/23 0326 08/29/23 1429 08/30/23 0309  NA 135 134* 137  K 3.7 4.6 4.7  CL 102 102 107  CO2 26 18* 25  GLUCOSE 138* 209* 94  BUN 17 15 18   CREATININE 1.13 1.22 0.97  CALCIUM 8.7* 8.2* 8.2*   GFR: Estimated Creatinine Clearance: 74 mL/min (by C-G formula based on SCr of 0.97 mg/dL). Recent Labs  Lab 08/29/23 0326 08/29/23 0920 08/29/23 1219 08/29/23 1429 08/29/23 1910 08/30/23 0309  WBC 9.1  --   --   --   --  15.6*  LATICACIDVEN  --  8.2* 8.1* 4.9* 1.8  --     Liver Function Tests: Recent Labs  Lab 08/29/23 0326  AST 30  ALT 22  ALKPHOS 68  BILITOT 1.1  PROT 8.6*  ALBUMIN 2.9*   No results for input(s): "LIPASE", "AMYLASE" in the last 168 hours. No results for input(s): "AMMONIA" in the last 168 hours.  ABG    Component Value Date/Time   PHART 7.353 07/14/2019 1031   PCO2ART 56.1 (H) 07/14/2019 1031   PO2ART 92.0 07/14/2019 1031   HCO3 31.2 (H) 07/14/2019 1031   TCO2 33 (H) 07/14/2019 1031   ACIDBASEDEF 4.0 (H) 02/13/2018 0337   O2SAT 96.0 07/14/2019 1031     Coagulation Profile: Recent Labs  Lab 08/30/23 0309  INR 1.2    Cardiac Enzymes: No results for input(s): "CKTOTAL", "CKMB", "CKMBINDEX", "TROPONINI" in the last 168 hours.  HbA1C: Hgb A1c MFr Bld  Date/Time Value Ref Range Status  04/13/2022 03:48 PM 5.7 4.6 - 6.5 % Final    Comment:    Glycemic Control Guidelines for People with Diabetes:Non Diabetic:  <6%Goal of Therapy: <7%Additional Action Suggested:  >8%   10/01/2021 10:56 AM 5.6 4.6 - 6.5 % Final    Comment:    Glycemic Control Guidelines for People with Diabetes:Non Diabetic:  <6%Goal of Therapy: <7%Additional Action Suggested:  >8%     CBG: No results for input(s): "GLUCAP" in the last 168 hours.  Review of Systems:   As above  Past Medical History:  He,  has a past medical history of Allergy, Anxiety, Arthritis, Atrial fibrillation (HCC), Cancer (HCC),  Coronary artery disease, History of total knee replacement, Hypertension, Hypothyroidism, Kidney cancer, primary, with metastasis from kidney to other site [facility]), Kidney stone, OSA (obstructive sleep apnea), Pituitary cyst (HCC), Pneumonia, and Prostate cancer (HCC).   Surgical History:   Past Surgical History:  Procedure Laterality Date   ABLATION OF DYSRHYTHMIC FOCUS  01/28/2018   ANTERIOR CERVICAL DECOMP/DISCECTOMY FUSION N/A 08/29/2012   Procedure: ANTERIOR CERVICAL DECOMPRESSION/DISCECTOMY FUSION 1 LEVEL;  Surgeon: Tia Alert, MD;  Location: [facility] NEURO ORS;  Service: Neurosurgery;  Laterality: N/A;   APPENDECTOMY     ATRIAL FIBRILLATION ABLATION N/A 01/28/2018   Procedure: ATRIAL FIBRILLATION ABLATION;  Surgeon: Regan Lemming, MD;  Location: [facility] INVASIVE CV LAB;  Service: Cardiovascular;  Laterality: N/A;   CARDIAC CATHETERIZATION  2008   clean   CHOLECYSTECTOMY     COLONOSCOPY W/ POLYPECTOMY     CRANIOTOMY N/A 11/08/2012   Procedure: CRANIOTOMY  HYPOPHYSECTOMY TRANSNASAL APPROACH;  Surgeon: [name], MD;  Location: [facility] NEURO ORS;  Service: Neurosurgery;  Laterality: N/A;  Transphenoidal Resection of Pituitary Tumor   FINGER SURGERY Left 2017   Dr Amanda Pea   INSERTION PROSTATE RADIATION SEED     IR THORACENTESIS ASP PLEURAL SPACE W/IMG GUIDE  04/15/2018   KIDNEY STONE SURGERY     KNEE ARTHROSCOPY  2007   left   NEPHRECTOMY Left    POSTERIOR CERVICAL LAMINECTOMY Left 04/24/2015   Procedure: Foraminotomy cervical five - cervical six cervical six - cervical seven left;  Surgeon: Tia Alert, MD;  Location: [facility] NEURO ORS;  Service: Neurosurgery;  Laterality: Left;   REPLACEMENT TOTAL KNEE BILATERAL     RIGHT/LEFT HEART CATH AND CORONARY ANGIOGRAPHY N/A 07/14/2019   Procedure: RIGHT/LEFT HEART CATH AND CORONARY ANGIOGRAPHY;  Surgeon: Corky Crafts, MD;  Location: [facility] INVASIVE CV LAB;  Service: Cardiovascular;  Laterality: N/A;     Social History:   reports that he has  never smoked. He has never used smokeless tobacco. He reports current alcohol use of about 2.0 standard drinks of alcohol per week. He reports that he does not use drugs.   Family History:  His family history includes Cancer in his father and mother; Colon cancer (age of onset: 68) in his brother; Kidney cancer in his sister. There is no history of Esophageal cancer, Rectal cancer, or Stomach cancer.   Allergies Allergies  Allergen Reactions   Ambien [Zolpidem] Anxiety    Anxiety and agitation when tried as sleeper in hospital aug 2019     Home Medications  Prior to Admission medications   Medication Sig Start Date End Date Taking? Authorizing Provider  albuterol (PROVENTIL) (2.5 MG/3ML) 0.083% nebulizer solution Take 3 mLs (2.5 mg total) by nebulization every 6 (six) hours as needed for wheezing or shortness of breath. 10/12/22  Yes Parrett, Tammy S, NP  albuterol (VENTOLIN HFA) 108 (90 Base) MCG/ACT inhaler INHALE 2 PUFFS BY MOUTH EVERY 6 HOURS AS NEEDED FOR WHEEZING OR SHORTNESS OF BREATH Patient taking differently: Inhale 2 puffs into the lungs every 6 (six) hours as needed for wheezing or shortness of breath. 07/13/23  Yes Parrett, Tammy S, NP  busPIRone (BUSPAR) 5 MG tablet TAKE ONE TABLET BY MOUTH TWICE A DAY AS NEEDED FOR ANXIETY Patient taking differently: Take 5 mg by mouth 2 (two) times daily as needed (anxiety). TAKE ONE TABLET BY MOUTH TWICE A DAY AS NEEDED FOR ANXIETY 01/18/23  Yes Shelva Majestic, MD  escitalopram (LEXAPRO) 20 MG tablet TAKE 1 TABLET BY MOUTH DAILY 06/07/23  Yes Shelva Majestic, MD  gabapentin (NEURONTIN) 300 MG capsule Take 1 capsule (300 mg total) by mouth 3 (three) times daily. 12/25/22  Yes Shelva Majestic, MD  LORazepam (ATIVAN) 0.5 MG tablet Take 1 tablet (0.5 mg total) by mouth daily as needed for anxiety (do not take within 8 hours of bed. do not drive for 8 hours after taking.). 04/30/23  Yes Shelva Majestic, MD  traMADol (ULTRAM) 50 MG tablet Take 1  tablet (50 mg total) by mouth every 6 (six) hours as needed (prefer max 3 per day). 01/30/22  Yes Ardith Dark, MD  traZODone (DESYREL) 50 MG tablet Take 0.5-1 tablets (25-50 mg total) by mouth at bedtime as needed for sleep. 12/25/22  Yes Shelva Majestic, MD     Critical care time: na   Lenard Galloway [facility] 08/30/23 11:40  AM  Please see [email or web address] for pager details.  From 7A-7P if no response, please call [phone] After hours, please call ELink [phone]

## 2023-08-30 NOTE — Plan of Care (Signed)
   Problem: Education: Goal: Knowledge of General Education information will improve Description: Including pain rating scale, medication(s)/side effects and non-pharmacologic comfort measures Outcome: Progressing   Problem: Nutrition: Goal: Adequate nutrition will be maintained Outcome: Progressing

## 2023-08-31 ENCOUNTER — Inpatient Hospital Stay (HOSPITAL_COMMUNITY)

## 2023-08-31 DIAGNOSIS — A419 Sepsis, unspecified organism: Secondary | ICD-10-CM | POA: Diagnosis not present

## 2023-08-31 DIAGNOSIS — J9621 Acute and chronic respiratory failure with hypoxia: Secondary | ICD-10-CM | POA: Diagnosis not present

## 2023-08-31 DIAGNOSIS — J9622 Acute and chronic respiratory failure with hypercapnia: Secondary | ICD-10-CM | POA: Diagnosis not present

## 2023-08-31 DIAGNOSIS — G4733 Obstructive sleep apnea (adult) (pediatric): Secondary | ICD-10-CM | POA: Diagnosis not present

## 2023-08-31 DIAGNOSIS — R0609 Other forms of dyspnea: Secondary | ICD-10-CM

## 2023-08-31 DIAGNOSIS — J181 Lobar pneumonia, unspecified organism: Secondary | ICD-10-CM | POA: Diagnosis not present

## 2023-08-31 DIAGNOSIS — J849 Interstitial pulmonary disease, unspecified: Secondary | ICD-10-CM | POA: Diagnosis not present

## 2023-08-31 DIAGNOSIS — J9611 Chronic respiratory failure with hypoxia: Secondary | ICD-10-CM | POA: Diagnosis not present

## 2023-08-31 DIAGNOSIS — R6520 Severe sepsis without septic shock: Secondary | ICD-10-CM | POA: Diagnosis not present

## 2023-08-31 LAB — ECHOCARDIOGRAM COMPLETE
AR max vel: 1.12 cm2
AV Area VTI: 1.14 cm2
AV Area mean vel: 1.05 cm2
AV Mean grad: 16 mmHg
AV Peak grad: 30 mmHg
Ao pk vel: 2.74 m/s
Area-P 1/2: 4.1 cm2
Calc EF: 69.6 %
Height: 71 in
MV VTI: 2.17 cm2
S' Lateral: 3.6 cm
Single Plane A2C EF: 65.7 %
Single Plane A4C EF: 72.1 %
Weight: 3739 [oz_av]

## 2023-08-31 LAB — MRSA NEXT GEN BY PCR, NASAL: MRSA by PCR Next Gen: DETECTED — AB

## 2023-08-31 MED ORDER — PERFLUTREN LIPID MICROSPHERE
1.0000 mL | INTRAVENOUS | Status: AC | PRN
  Administered 2023-08-31: 2 mL via INTRAVENOUS

## 2023-08-31 NOTE — Progress Notes (Signed)
  Progress Note   Patient: Wayne Booth ZOX:[id number] DOB: January 20, 1941 DOA: 08/29/2023     2 DOS: the patient was seen and examined on 08/31/2023   Brief hospital course: 83 year old man PMH COPD, ILD, chronic hypoxic respiratory failure on 5 L, OSA, presented with worsening shortness of breath over the last several days. Admitted for severe sepsis secondary to multilobar pneumonia.   Consultants Pulmonology  Procedures/Events   Assessment and Plan: Severe sepsis  Multilobar pneumonia superimposed on advanced lung disease CT showed no PE.  Chronic fibrotic interstitial lung disease.  Multifocal bilateral consolidative change multiple lobes. Lactic acid was over 8 on admission and only gradually trended back to normal with aggressive fluids.  Never correlated with clinical condition Slow to improve.  Still significantly short of breath.  Continue empiric antibiotics. 2D echocardiogram reassuring   Chronic hypoxic respiratory failure on 5 L at home Pulmonary fibrosis/ILD OSA Continue management as per pulmonology including Brovana, Pulmicort, Yupelri, Solu-Medrol   Right midlung nodule 1.3 cm Outpatient follow-up with pulmonology   Aortic atherosclerosis       Subjective:  Still SOB at rest and with talking  Physical Exam: Vitals:   08/31/23 0749 08/31/23 0751 08/31/23 1006 08/31/23 1341  BP:   121/68 132/79  Pulse:   89 83  Resp:   (!) 22 20  Temp:   97.6 F (36.4 C) 97.8 F (36.6 C)  TempSrc:   Oral Oral  SpO2: 94% 94% 98% 96%  Weight:      Height:       Physical Exam Vitals reviewed.  Constitutional:      General: He is not in acute distress.    Appearance: He is not ill-appearing or toxic-appearing.  Cardiovascular:     Rate and Rhythm: Normal rate and regular rhythm.     Heart sounds: No murmur heard. Pulmonary:     Effort: No respiratory distress.     Breath sounds: No wheezing, rhonchi or rales.     Comments: Mild increased respiratory  effort Neurological:     Mental Status: He is alert.  Psychiatric:        Mood and Affect: Mood normal.        Behavior: Behavior normal.     Data Reviewed: Echo noted  Family Communication: Wife of 60 years at bedside  Disposition: Status is: Inpatient Remains inpatient appropriate because: pneumonia     Time spent: 20 minutes  Author: Brendia Sacks, MD 08/31/2023 4:42 PM  For on call review [email or web address].

## 2023-08-31 NOTE — Progress Notes (Signed)
   08/31/23 1938  BiPAP/CPAP/SIPAP  BiPAP/CPAP/SIPAP Pt Type Adult  Reason BIPAP/CPAP not in use Non-compliant (Pt refusing cpap for the night)

## 2023-08-31 NOTE — Plan of Care (Signed)
   Problem: Coping: Goal: Level of anxiety will decrease Outcome: Progressing

## 2023-08-31 NOTE — Consult Note (Signed)
 NAME:  Wayne Booth, MRN:  [id number], DOB:  [birth year], LOS: 2 ADMISSION DATE:  08/29/2023, CONSULTATION DATE:  3/3 REFERRING MD:  Irene Limbo, CHIEF COMPLAINT:  hypoxic respiratory failure   History of Present Illness:  83 year old male with past medical hisotry of atrial fibrillation, CAD, HTN, OSA, chronic hypoxic respiratory failure, pulmonary fibrosis/ILD on 5LNC home O2 who presented to ED on 3/3 with shortness of breath. In ED, reporting increased work of breathing over the past 1 week. Presented tachycardic, afebrile, tachypneic. COVID/flu/RSV negative, BNP 38, troponin 4, WBC 9.1, CMP grossly normal. Blood cultures drawn. Initial lactic 8.1>4.9. He had CTA PE study negative for pulmonary embolism. Demonstrated known ILD with likely superimposed multifocal pneumonia. On 3/3 pulmonary consulted for assistance.   On my exam, older male, pleasant. He is objectively dyspneic but not in extremis. On 6LNC. He subjectively feels short of breath. Denies new or different cough, fever or chills, decreased appetite, N/V/D, sick contacts. He denies chest pain or palpitations. He states he wears his oxygen "most of the time." Does not wear a CPAP. Has not seen pulmonologist since 09/2022  Pertinent  Medical History  atrial fibrillation, CAD, HTN, OSA, chronic hypoxic respiratory failure, pulmonary fibrosis/ILD on 5LNC home O2  Significant Hospital Events: Including procedures, antibiotic start and stop dates in addition to other pertinent events   3/2: admit to trh for sob  3/3: pulm consult  Interim History / Subjective:   No acute events overnight On 5L O2, his baseline Still feeling rough  Objective   Blood pressure 129/82, pulse 87, temperature 97.7 F (36.5 C), temperature source Oral, resp. rate 18, height 5\' 11"  (1.803 m), weight 106 kg, SpO2 96%.        Intake/Output Summary (Last 24 hours) at 08/31/2023 0746 Last data filed at 08/31/2023 7829 Gross per 24 hour  Intake 1067.07 ml   Output 4175 ml  Net -3107.93 ml   Filed Weights   08/29/23 1354  Weight: 106 kg    Examination: General: elderly male, chronically ill appearing, no acute distress, resting in bed HEENT: [place]/AT, moist mucous membranes, sclera anicteric Neuro: A&O x 3, moving all extremities CV: rrr, s1s2, no murmurs PULM: diminished breath sounds GI: soft, non-tender, non-distended, BS+ Extremities: warm, no edema Skin: no rashes   Labs: Sputum culture with gram negative rods, gram positive rods, gram positive cocci  Resolved Hospital Problem list     Assessment & Plan:  Acute on chronic hypoxic respiratory failure  Pneumonia ILD/Pulmonary Fibrosis on 5LNC home O2 OSA not on CPAP  5 days worsening dyspnea. Does not feel he could walk to his mailbox which he normally does. Dyspneic at rest. No infectious symptoms at home. On 6LNC on admission. CTA PE negative for embolism. Does show chronic ILD with possible overlying multifocal pneumonia. Trop negative. BNP wnl. Covid/rsv/flu negative. - Extended viral panel negative. Strep urine ag negative - f/u sputum culture speciation - Echo with normal EF, RV not well visualized - Continue brovana/pulmicort/yupelri  - continue solu-medrol 40mg  daily  -Continue ceftriaxone and azithromycin - Check MRSA screen - will need outpatient pulm follow-up scheduled - pulm will follow   Below per primary Atrial fibrillation  CAD HTN Best Practice (right click and "Reselect all SmartList Selections" daily)   Per primary  Labs   CBC: Recent Labs  Lab 08/29/23 0326 08/30/23 0309  WBC 9.1 15.6*  HGB 15.7 13.2  HCT 49.2 41.8  MCV 94.6 95.4  PLT 340 277    Basic  Metabolic Panel: Recent Labs  Lab 08/29/23 0326 08/29/23 1429 08/30/23 0309  NA 135 134* 137  K 3.7 4.6 4.7  CL 102 102 107  CO2 26 18* 25  GLUCOSE 138* 209* 94  BUN 17 15 18   CREATININE 1.13 1.22 0.97  CALCIUM 8.7* 8.2* 8.2*   GFR: Estimated Creatinine Clearance: 74 mL/min  (by C-G formula based on SCr of 0.97 mg/dL). Recent Labs  Lab 08/29/23 0326 08/29/23 0920 08/29/23 1219 08/29/23 1429 08/29/23 1910 08/30/23 0309 08/30/23 1222  PROCALCITON  --   --   --   --   --   --  <0.10  WBC 9.1  --   --   --   --  15.6*  --   LATICACIDVEN  --  8.2* 8.1* 4.9* 1.8  --   --     Liver Function Tests: Recent Labs  Lab 08/29/23 0326  AST 30  ALT 22  ALKPHOS 68  BILITOT 1.1  PROT 8.6*  ALBUMIN 2.9*   No results for input(s): "LIPASE", "AMYLASE" in the last 168 hours. No results for input(s): "AMMONIA" in the last 168 hours.  ABG    Component Value Date/Time   PHART 7.353 07/14/2019 1031   PCO2ART 56.1 (H) 07/14/2019 1031   PO2ART 92.0 07/14/2019 1031   HCO3 31.2 (H) 07/14/2019 1031   TCO2 33 (H) 07/14/2019 1031   ACIDBASEDEF 4.0 (H) 02/13/2018 0337   O2SAT 96.0 07/14/2019 1031     Coagulation Profile: Recent Labs  Lab 08/30/23 0309  INR 1.2    Cardiac Enzymes: No results for input(s): "CKTOTAL", "CKMB", "CKMBINDEX", "TROPONINI" in the last 168 hours.  HbA1C: Hgb A1c MFr Bld  Date/Time Value Ref Range Status  04/13/2022 03:48 PM 5.7 4.6 - 6.5 % Final    Comment:    Glycemic Control Guidelines for People with Diabetes:Non Diabetic:  <6%Goal of Therapy: <7%Additional Action Suggested:  >8%   10/01/2021 10:56 AM 5.6 4.6 - 6.5 % Final    Comment:    Glycemic Control Guidelines for People with Diabetes:Non Diabetic:  <6%Goal of Therapy: <7%Additional Action Suggested:  >8%     CBG: No results for input(s): "GLUCAP" in the last 168 hours.    Critical care time: na    Melody Comas, MD [facility] Office: [phone]   See Amion for personal pager PCCM on call pager [phone] until 7pm. Please call Elink 7p-7a. [phone]

## 2023-09-01 ENCOUNTER — Ambulatory Visit: Payer: Medicare Other | Admitting: Pulmonary Disease

## 2023-09-01 DIAGNOSIS — J9621 Acute and chronic respiratory failure with hypoxia: Secondary | ICD-10-CM | POA: Diagnosis not present

## 2023-09-01 DIAGNOSIS — A419 Sepsis, unspecified organism: Secondary | ICD-10-CM | POA: Diagnosis not present

## 2023-09-01 DIAGNOSIS — R6520 Severe sepsis without septic shock: Secondary | ICD-10-CM | POA: Diagnosis not present

## 2023-09-01 LAB — BASIC METABOLIC PANEL
Anion gap: 8 (ref 5–15)
BUN: 18 mg/dL (ref 8–23)
CO2: 33 mmol/L — ABNORMAL HIGH (ref 22–32)
Calcium: 8.5 mg/dL — ABNORMAL LOW (ref 8.9–10.3)
Chloride: 98 mmol/L (ref 98–111)
Creatinine, Ser: 0.92 mg/dL (ref 0.61–1.24)
GFR, Estimated: >60 mL/min (ref >60)
Glucose, Bld: 90 mg/dL (ref 70–99)
Potassium: 4.3 mmol/L (ref 3.5–5.1)
Sodium: 139 mmol/L (ref 135–145)

## 2023-09-01 LAB — CULTURE, RESPIRATORY W GRAM STAIN

## 2023-09-01 LAB — CBC
HCT: 49.9 % (ref 39.0–52.0)
Hemoglobin: 15.4 g/dL (ref 13.0–17.0)
MCH: 29.9 pg (ref 26.0–34.0)
MCHC: 30.9 g/dL (ref 30.0–36.0)
MCV: 96.9 fL (ref 80.0–100.0)
Platelets: 323 10*3/uL (ref 150–400)
RBC: 5.15 MIL/uL (ref 4.22–5.81)
RDW: 14 % (ref 11.5–15.5)
WBC: 12.1 10*3/uL — ABNORMAL HIGH (ref 4.0–10.5)
nRBC: 0 % (ref 0.0–0.2)

## 2023-09-01 LAB — LEGIONELLA PNEUMOPHILA SEROGP 1 UR AG: L. pneumophila Serogp 1 Ur Ag: NEGATIVE

## 2023-09-01 LAB — GLUCOSE, CAPILLARY: Glucose-Capillary: 189 mg/dL — ABNORMAL HIGH (ref 70–99)

## 2023-09-01 MED ORDER — FUROSEMIDE 20 MG PO TABS
20.0000 mg | ORAL_TABLET | Freq: Once | ORAL | Status: AC
  Administered 2023-09-01: 20 mg via ORAL
  Filled 2023-09-01: qty 1

## 2023-09-01 MED ORDER — FUROSEMIDE 10 MG/ML IJ SOLN
20.0000 mg | Freq: Once | INTRAMUSCULAR | Status: AC
  Administered 2023-09-01: 20 mg via INTRAVENOUS
  Filled 2023-09-01: qty 2

## 2023-09-01 NOTE — Progress Notes (Signed)
 PROGRESS NOTE  Wayne Booth  WJX:[id number] DOB: [birth year] DOA: 08/29/2023 PCP: Shelva Majestic, MD   Brief Narrative: Patient is a 83 year old male with history of COPD, ILD, chronic hypoxic respiratory failure on 5 L of oxygen per minute, OSA who presented with shortness of breath from home.  Chest imaging on presentation showed multifocal bilateral consolidative changes.  Patient was admitted for the management of acute proximal respiratory failure secondary to multifocal pneumonia.  Currently on 5 L of oxygen per minute which is his baseline.  PCCM following.  Assessment & Plan:  Principal Problem:   Severe sepsis (HCC) Active Problems:   OSA (obstructive sleep apnea)   Chronic respiratory failure with hypoxia (HCC)   Aortic atherosclerosis (HCC)   ILD (interstitial lung disease) (HCC)   Nodule of right lung   Lobar pneumonia (HCC)   Severe sepsis secondary to multifocal pneumonia: Presented with worsening dyspnea.  Chest imaging showed multifocal bilateral consolidative changes.  Lactic acid was elevated to 8 on presentation.  Given IV fluid, started on antibiotics.  Blood cultures have not shown any growth.  Sputum culture showing mixed organisms likely normal respiratory flora: No staph or Pseudomonas.  Currently on ceftriaxone and azithromycin.  Respiratory viral panel, COVID/flu/RSV negative.  Chronic hypoxic respiratory failure/history of COPD/ILD: On 5 L of oxygen at home.  Chest imaging as above.  CT angiogram did not show any PE. Also being given a steroid, Lasix oral.  Patient is still significantly dyspneic at times.  Echo had shown EF of 65 to 70%.  Continue current bronchodilators.  If he continues to be dyspneic, we can consider putting on IV Lasix for diuresis  Right middle lung nodule: 1.3 cm.  He needs to follow-up with pulmonology as an outpatient.  Depression/anxiety: On buspirone, Lexapro trazodone at home.  Debility/deconditioning: Complains of not feeling  well.  Very weak.  Will consult PT. lives with his wife and ambulates well at home        DVT prophylaxis:enoxaparin (LOVENOX) injection 40 mg Start: 08/29/23 1000     Code Status: Full Code  Family Communication: None at bedside  Patient status:Inpatient  Patient is from :home  Anticipated discharge OZ:HYQM  Estimated DC date:2-3 days   Consultants: PCCM  Procedures:None  Antimicrobials:  Anti-infectives (From admission, onward)    Start     Dose/Rate Route Frequency Ordered Stop   08/30/23 1000  cefTRIAXone (ROCEPHIN) 2 g in sodium chloride 0.9 % 100 mL IVPB        2 g 200 mL/hr over 30 Minutes Intravenous Daily 08/29/23 0937 09/03/23 0959   08/30/23 1000  azithromycin (ZITHROMAX) 500 mg in sodium chloride 0.9 % 250 mL IVPB        500 mg 250 mL/hr over 60 Minutes Intravenous Daily 08/29/23 0937 09/03/23 0959   08/29/23 0845  cefTRIAXone (ROCEPHIN) 2 g in sodium chloride 0.9 % 100 mL IVPB        2 g 200 mL/hr over 30 Minutes Intravenous  Once 08/29/23 0830 08/29/23 1007   08/29/23 0845  azithromycin (ZITHROMAX) 500 mg in sodium chloride 0.9 % 250 mL IVPB        500 mg 250 mL/hr over 60 Minutes Intravenous  Once 08/29/23 0830 08/29/23 0940       Subjective: Patient seen and examined at bedside today.  Hemodynamically stable.  Sitting in the chair.  Complains of not feeling well.  Does not appear severe short of breath, speaks in full sentences but appears tired.  No lower extremity edema.  Bilateral crackles on examination.  Objective: Vitals:   08/31/23 1937 08/31/23 2040 09/01/23 0457 09/01/23 0824  BP:  121/75 (!) 148/83   Pulse:  90 81   Resp:  16 20   Temp:  98.4 F (36.9 C) (!) 97.5 F (36.4 C)   TempSrc:  Oral Oral   SpO2: 97% 97% 98% 97%  Weight:      Height:        Intake/Output Summary (Last 24 hours) at 09/01/2023 1305 Last data filed at 09/01/2023 1100 Gross per 24 hour  Intake 1200 ml  Output 2000 ml  Net -800 ml   Filed Weights    08/29/23 1354  Weight: 106 kg    Examination:  General exam: Chronically ill looking, weak, not in apparent distress HEENT: PERRL Respiratory system: Bilateral crackles  Cardiovascular system: S1 & S2 heard, RRR.  Gastrointestinal system: Abdomen is slightly distended, soft and nontender. Central nervous system: Alert and oriented Extremities: No edema, no clubbing ,no cyanosis Skin: No rashes, no ulcers,no icterus     Data Reviewed: I have personally reviewed following labs and imaging studies  CBC: Recent Labs  Lab 08/29/23 0326 08/30/23 0309 09/01/23 0843  WBC 9.1 15.6* 12.1*  HGB 15.7 13.2 15.4  HCT 49.2 41.8 49.9  MCV 94.6 95.4 96.9  PLT 340 277 323   Basic Metabolic Panel: Recent Labs  Lab 08/29/23 0326 08/29/23 1429 08/30/23 0309 09/01/23 0843  NA 135 134* 137 139  K 3.7 4.6 4.7 4.3  CL 102 102 107 98  CO2 26 18* 25 33*  GLUCOSE 138* 209* 94 90  BUN 17 15 18 18   CREATININE 1.13 1.22 0.97 0.92  CALCIUM 8.7* 8.2* 8.2* 8.5*     Recent Results (from the past 240 hours)  Resp panel by RT-PCR (RSV, Flu A&B, Covid) Anterior Nasal Swab     Status: None   Collection Time: 08/29/23  3:26 AM   Specimen: Anterior Nasal Swab  Result Value Ref Range Status   SARS Coronavirus 2 by RT PCR NEGATIVE NEGATIVE Final    Comment: (NOTE) SARS-CoV-2 target nucleic acids are NOT DETECTED.  The SARS-CoV-2 RNA is generally detectable in upper respiratory specimens during the acute phase of infection. The lowest concentration of SARS-CoV-2 viral copies this assay can detect is 138 copies/mL. A negative result does not preclude SARS-Cov-2 infection and should not be used as the sole basis for treatment or other patient management decisions. A negative result may occur with  improper specimen collection/handling, submission of specimen other than nasopharyngeal swab, presence of viral mutation(s) within the areas targeted by this assay, and inadequate number of  viral copies(<138 copies/mL). A negative result must be combined with clinical observations, patient history, and epidemiological information. The expected result is Negative.  Fact Sheet for Patients:  BloggerCourse.com  Fact Sheet for Healthcare Providers:  SeriousBroker.it  This test is no t yet approved or cleared by the [place] FDA and  has been authorized for detection and/or diagnosis of SARS-CoV-2 by FDA under an Emergency Use Authorization (EUA). This EUA will remain  in effect (meaning this test can be used) for the duration of the COVID-19 declaration under Section 564(b)(1) of the Act, 21 U.S.C.section 360bbb-3(b)(1), unless the authorization is terminated  or revoked sooner.       Influenza A by PCR NEGATIVE NEGATIVE Final   Influenza B by PCR NEGATIVE NEGATIVE Final    Comment: (NOTE) The Xpert Xpress SARS-CoV-2/FLU/RSV plus  assay is intended as an aid in the diagnosis of influenza from Nasopharyngeal swab specimens and should not be used as a sole basis for treatment. Nasal washings and aspirates are unacceptable for Xpert Xpress SARS-CoV-2/FLU/RSV testing.  Fact Sheet for Patients: BloggerCourse.com  Fact Sheet for Healthcare Providers: SeriousBroker.it  This test is not yet approved or cleared by the [place] FDA and has been authorized for detection and/or diagnosis of SARS-CoV-2 by FDA under an Emergency Use Authorization (EUA). This EUA will remain in effect (meaning this test can be used) for the duration of the COVID-19 declaration under Section 564(b)(1) of the Act, 21 U.S.C. section 360bbb-3(b)(1), unless the authorization is terminated or revoked.     Resp Syncytial Virus by PCR NEGATIVE NEGATIVE Final    Comment: (NOTE) Fact Sheet for Patients: BloggerCourse.com  Fact Sheet for Healthcare  Providers: SeriousBroker.it  This test is not yet approved or cleared by the [place] FDA and has been authorized for detection and/or diagnosis of SARS-CoV-2 by FDA under an Emergency Use Authorization (EUA). This EUA will remain in effect (meaning this test can be used) for the duration of the COVID-19 declaration under Section 564(b)(1) of the Act, 21 U.S.C. section 360bbb-3(b)(1), unless the authorization is terminated or revoked.  Performed at [facility], [address], [place], [place] [address]   Culture, blood (routine x 2)     Status: None (Preliminary result)   Collection Time: 08/29/23  9:00 AM   Specimen: BLOOD  Result Value Ref Range Status   Specimen Description   Final    BLOOD LEFT ANTECUBITAL Performed at [facility], [address], [place], [place] [address]    Special Requests   Final    BOTTLES DRAWN AEROBIC AND ANAEROBIC Blood Culture results may not be optimal due to an inadequate volume of blood received in culture bottles Performed at [facility], [address], [place], [place] [address]    Culture   Final    NO GROWTH 3 DAYS Performed at [facility], [address], [place], [place] [address]    Report Status PENDING  Incomplete  Culture, blood (routine x 2)     Status: None (Preliminary result)   Collection Time: 08/29/23  9:05 AM   Specimen: BLOOD  Result Value Ref Range Status   Specimen Description   Final    BLOOD LEFT ANTECUBITAL Performed at [facility], [address], [place], [place] [address]    Special Requests   Final    BOTTLES DRAWN AEROBIC AND ANAEROBIC Blood Culture results may not be optimal due to an inadequate volume of blood received in culture bottles Performed at [facility], [address] [place], [place] [address]    Culture   Final    NO GROWTH 3 DAYS Performed at [facility], [address] [place], [place] [address]    Report Status PENDING  Incomplete  Respiratory (~20 pathogens) panel by PCR     Status: None   Collection Time: 08/30/23 12:31 PM   Specimen: Nasopharyngeal Swab; Respiratory  Result Value Ref Range Status   Adenovirus NOT DETECTED NOT DETECTED Final   Coronavirus 229E NOT DETECTED NOT DETECTED Final    Comment: (NOTE) The Coronavirus on the Respiratory Panel, DOES NOT test for the novel  Coronavirus (2019 nCoV)    Coronavirus HKU1 NOT DETECTED NOT DETECTED Final   Coronavirus NL63 NOT DETECTED NOT DETECTED  Final   Coronavirus OC43 NOT DETECTED NOT DETECTED Final   Metapneumovirus NOT DETECTED NOT DETECTED Final   Rhinovirus / Enterovirus NOT DETECTED NOT DETECTED Final   Influenza A NOT DETECTED NOT DETECTED Final   Influenza B NOT DETECTED NOT DETECTED Final   Parainfluenza Virus 1 NOT DETECTED NOT DETECTED Final   Parainfluenza Virus 2 NOT DETECTED NOT DETECTED Final   Parainfluenza Virus 3 NOT DETECTED NOT DETECTED Final   Parainfluenza Virus 4 NOT DETECTED NOT DETECTED Final   Respiratory Syncytial Virus NOT DETECTED NOT DETECTED Final   Bordetella pertussis NOT DETECTED NOT DETECTED Final   Bordetella Parapertussis NOT DETECTED NOT DETECTED Final   Chlamydophila pneumoniae NOT DETECTED NOT DETECTED Final   Mycoplasma pneumoniae NOT DETECTED NOT DETECTED Final    Comment: Performed at [facility], [address], [place], [place] [address]  Expectorated Sputum Assessment w Gram Stain, Rflx to Resp Cult     Status: None   Collection Time: 08/30/23 12:31 PM   Specimen: Expectorated Sputum  Result Value Ref Range Status   Specimen Description EXPECTORATED SPUTUM  Final   Special Requests NONE  Final   Sputum evaluation   Final    THIS SPECIMEN IS ACCEPTABLE FOR SPUTUM CULTURE Performed at [facility], [address], [place], [place] [address]    Report Status 08/30/2023 FINAL  Final  Culture,  Respiratory w Gram Stain     Status: None   Collection Time: 08/30/23 12:31 PM  Result Value Ref Range Status   Specimen Description   Final    EXPECTORATED SPUTUM Performed at [facility], [address] [place], [place] [address]    Special Requests   Final    NONE Reflexed from [id number] Performed at [facility], [address] [place], [place] [address]    Gram Stain   Final    MODERATE WBC PRESENT, PREDOMINANTLY PMN RARE GRAM NEGATIVE RODS RARE GRAM POSITIVE RODS RARE GRAM POSITIVE COCCI    Culture   Final    Normal respiratory flora-no Staph aureus or Pseudomonas seen Performed at [facility], [address], [place], [place] [address]    Report Status 09/01/2023 FINAL  Final  MRSA Next Gen by PCR, Nasal     Status: Abnormal   Collection Time: 08/31/23 11:51 AM  Result Value Ref Range Status   MRSA by PCR Next Gen DETECTED (A) NOT DETECTED Final    Comment: (NOTE) The GeneXpert MRSA Assay (FDA approved for NASAL specimens only), is one component of a comprehensive MRSA colonization surveillance program. It is not intended to diagnose MRSA infection nor to guide or monitor treatment for MRSA infections. Test performance is not FDA approved in patients less than 66 years old. Performed at [facility] [address] [place], [place] [address]      Radiology Studies: ECHOCARDIOGRAM COMPLETE Result Date: 08/31/2023    ECHOCARDIOGRAM REPORT   Patient Name:   Wayne Booth Date of Exam: 08/31/2023 Medical Rec #:  [id number]       Height:       71.0 in Accession #:    [id number]      Weight:       233.7 lb Date of Birth:  [birth year]      BSA:          2.253 m Patient Age:    82 years        BP:  126/77 mmHg Patient Gender: M               HR:           87 bpm. Exam Location:  Inpatient Procedure: 2D Echo, Cardiac Doppler, Color Doppler and Intracardiac            Opacification Agent (Both Spectral and  Color Flow Doppler were            utilized during procedure). Indications:    Dyspnea  History:        Patient has prior history of Echocardiogram examinations, most                 recent 02/04/2018. CHF, Arrythmias:Atrial Flutter; Risk                 Factors:Hypertension.  Sonographer:    Amy Chionchio Referring Phys: [id number] Cristopher Peru IMPRESSIONS  1. Left ventricular ejection fraction, by estimation, is 65 to 70%. The left ventricle has normal function. The left ventricle has no regional wall motion abnormalities. Left ventricular diastolic function could not be evaluated.  2. Right ventricular systolic function was not well visualized. The right ventricular size is not well visualized.  3. The mitral valve is degenerative. Trivial mitral valve regurgitation. Moderate to severe mitral annular calcification.  4. The aortic valve is tricuspid. Aortic valve regurgitation is not visualized. Mild aortic valve stenosis. Aortic valve area, by VTI measures 1.14 cm. Aortic valve mean gradient measures 16.0 mmHg. Aortic valve Vmax measures 2.74 m/s. FINDINGS  Left Ventricle: Left ventricular ejection fraction, by estimation, is 65 to 70%. The left ventricle has normal function. The left ventricle has no regional wall motion abnormalities. The left ventricular internal cavity size was normal in size. There is  no left ventricular hypertrophy. Left ventricular diastolic function could not be evaluated due to mitral annular calcification (moderate or greater). Left ventricular diastolic function could not be evaluated. Right Ventricle: The right ventricular size is not well visualized. Right vetricular wall thickness was not well visualized. Right ventricular systolic function was not well visualized. Left Atrium: Left atrial size was not well visualized. Right Atrium: Right atrial size was not well visualized. Pericardium: There is no evidence of pericardial effusion. Presence of epicardial fat layer. Mitral Valve: The  mitral valve is degenerative in appearance. Moderate to severe mitral annular calcification. Trivial mitral valve regurgitation. MV peak gradient, 5.4 mmHg. The mean mitral valve gradient is 3.0 mmHg. Tricuspid Valve: The tricuspid valve is grossly normal. Tricuspid valve regurgitation is mild . No evidence of tricuspid stenosis. Aortic Valve: The aortic valve is tricuspid. Aortic valve regurgitation is not visualized. Mild aortic stenosis is present. Aortic valve mean gradient measures 16.0 mmHg. Aortic valve peak gradient measures 30.0 mmHg. Aortic valve area, by VTI measures 1.14 cm. Pulmonic Valve: The pulmonic valve was grossly normal. Pulmonic valve regurgitation is not visualized. No evidence of pulmonic stenosis. Aorta: The aortic root and ascending aorta are structurally normal, with no evidence of dilitation. Venous: The inferior vena cava was not well visualized. IAS/Shunts: The interatrial septum was not well visualized.  LEFT VENTRICLE PLAX 2D LVIDd:         4.30 cm     Diastology LVIDs:         3.60 cm     LV e' medial:    7.51 cm/s LV PW:         1.10 cm     LV E/e' medial:  13.8 LV IVS:  1.10 cm     LV e' lateral:   5.66 cm/s LVOT diam:     2.10 cm     LV E/e' lateral: 18.4 LV SV:         59 LV SV Index:   26 LVOT Area:     3.46 cm  LV Volumes (MOD) LV vol d, MOD A2C: 83.7 ml LV vol d, MOD A4C: 91.2 ml LV vol s, MOD A2C: 28.7 ml LV vol s, MOD A4C: 25.4 ml LV SV MOD A2C:     55.0 ml LV SV MOD A4C:     91.2 ml LV SV MOD BP:      61.6 ml RIGHT VENTRICLE TAPSE (M-mode): 1.3 cm AORTIC VALVE                     PULMONIC VALVE AV Area (Vmax):    1.12 cm      PV Vmax:       1.35 m/s AV Area (Vmean):   1.05 cm      PV Peak grad:  7.3 mmHg AV Area (VTI):     1.14 cm AV Vmax:           274.00 cm/s AV Vmean:          194.000 cm/s AV VTI:            0.512 m AV Peak Grad:      30.0 mmHg AV Mean Grad:      16.0 mmHg LVOT Vmax:         88.30 cm/s LVOT Vmean:        58.900 cm/s LVOT VTI:          0.169 m  LVOT/AV VTI ratio: 0.33  AORTA Ao Root diam: 3.20 cm Ao Asc diam:  3.30 cm MITRAL VALVE                TRICUSPID VALVE MV Area (PHT): 4.10 cm     TR Peak grad:   37.0 mmHg MV Area VTI:   2.17 cm     TR Vmax:        304.00 cm/s MV Peak grad:  5.4 mmHg MV Mean grad:  3.0 mmHg     SHUNTS MV Vmax:       1.16 m/s     Systemic VTI:  0.17 m MV Vmean:      79.0 cm/s    Systemic Diam: 2.10 cm MV Decel Time: 185 msec MV E velocity: 104.00 cm/s MV A velocity: 82.00 cm/s MV E/A ratio:  1.27 Lennie Odor MD Electronically signed by Lennie Odor MD Signature Date/Time: 08/31/2023/11:00:56 AM    Final     Scheduled Meds:  arformoterol  15 mcg Nebulization BID   budesonide (PULMICORT) nebulizer solution  0.25 mg Nebulization BID   Chlorhexidine Gluconate Cloth  6 each Topical Daily   enoxaparin (LOVENOX) injection  40 mg Subcutaneous Daily   escitalopram  20 mg Oral Daily   gabapentin  300 mg Oral TID   methylPREDNISolone (SOLU-MEDROL) injection  40 mg Intravenous Daily   revefenacin  175 mcg Nebulization Daily   Continuous Infusions:  azithromycin 500 mg (09/01/23 1118)   cefTRIAXone (ROCEPHIN)  IV 2 g (09/01/23 0945)     LOS: 3 days   Burnadette Pop, MD [facility] P3/10/2023, 1:05 PM

## 2023-09-01 NOTE — Progress Notes (Signed)
 Transition of Care [facility]) - Inpatient Brief Assessment   Patient Details  Name: Wayne Booth MRN: [id number] Date of Birth: 04/20/1941  Transition of Care [facility]) CM/SW Contact:    Larrie Kass, LCSW Phone Number: 09/01/2023, 8:44 AM      Transition of Care Asessment: Insurance and Status: Insurance coverage has been reviewed Patient has primary care physician: Yes Home environment has been reviewed: home with spouse Prior level of function:: independent   Social Drivers of Health Review: SDOH reviewed no interventions necessary Readmission risk has been reviewed: Yes Transition of care needs: no transition of care needs at this time

## 2023-09-01 NOTE — Consult Note (Signed)
 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 In ED, reporting increased work of breathing over the past 1 week. Presented tachycardic, afebrile, tachypneic. COVID/flu/RSV negative, BNP 38, troponin 4, WBC 9.1, CMP grossly normal. Blood cultures drawn. Initial lactic 8.1>4.9. He had CTA PE study negative for pulmonary embolism. Demonstrated known ILD with likely superimposed multifocal pneumonia. On 3/3 pulmonary consulted for assistance.   On my exam, older male, pleasant. He is objectively dyspneic but not in extremis. On 6LNC. He subjectively feels short of breath. Denies new or different cough, fever or chills, decreased appetite, N/V/D, sick contacts. He denies chest pain or palpitations. He states he wears his oxygen "most of the time." Does not wear a CPAP. 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 rate 20, height 5\' 11"  (1.803 m), weight 106 kg, SpO2 97%.        Intake/Output Summary (Last 24 hours) at 09/01/2023 1104 Last data filed at 09/01/2023 0930 Gross per 24 hour   Intake 1200 ml  Output 2800 ml  Net -1600 ml   Filed Weights   08/29/23 1354  Weight: 106 kg    Examination: General: elderly male, chronically ill appearing, no acute distress, resting in bed HEENT: [place]/AT, moist mucous membranes, sclera anicteric Neuro: A&O x 3, moving all extremities CV: rrr, s1s2, no murmurs PULM: diminished breath sounds, crackles GI: soft, non-tender, non-distended, BS+ Extremities: warm, no edema Skin: no rashes   Labs: Sputum culture with gram negative rods, gram positive rods, gram positive cocci  Resolved Hospital Problem list     Assessment & Plan:  Acute on chronic hypoxic respiratory failure  Pneumonia ILD/Pulmonary Fibrosis on 5LNC home O2 OSA not on CPAP  5 days worsening dyspnea. Does not feel he could walk to his mailbox which he normally does. Dyspneic at rest. No infectious symptoms at home. On 6LNC on admission. CTA PE negative for embolism. Does show chronic ILD with possible overlying multifocal pneumonia. Trop negative. BNP wnl. Covid/rsv/flu negative. - Extended viral panel negative. Strep urine ag negative - f/u sputum culture speciation - Echo with normal EF, RV not well visualized - Continue brovana/pulmicort/yupelri  - continue solu-medrol 40mg  daily  - Continue ceftriaxone and azithromycin - give lasix 20mg  PO today - PT ordered - will need outpatient pulm follow-up scheduled - pulm will follow   Below per primary Atrial fibrillation  CAD HTN Best Practice (right click and "Reselect all SmartList Selections" daily)   Per primary  Labs   CBC: Recent Labs  Lab 08/29/23 0326 08/30/23 0309 09/01/23 0843  WBC 9.1 15.6* 12.1*  HGB 15.7 13.2 15.4  HCT  49.2 41.8 49.9  MCV 94.6 95.4 96.9  PLT 340 277 323    Basic Metabolic Panel: Recent Labs  Lab 08/29/23 0326 08/29/23 1429 08/30/23 0309 09/01/23 0843  NA 135 134* 137 139  K 3.7 4.6 4.7 4.3  CL 102 102 107 98  CO2 26 18* 25 33*  GLUCOSE 138* 209* 94 90   BUN 17 15 18 18   CREATININE 1.13 1.22 0.97 0.92  CALCIUM 8.7* 8.2* 8.2* 8.5*   GFR: Estimated Creatinine Clearance: 78 mL/min (by C-G formula based on SCr of 0.92 mg/dL). Recent Labs  Lab 08/29/23 0326 08/29/23 0920 08/29/23 1219 08/29/23 1429 08/29/23 1910 08/30/23 0309 08/30/23 1222 09/01/23 0843  PROCALCITON  --   --   --   --   --   --  <0.10  --   WBC 9.1  --   --   --   --  15.6*  --  12.1*  LATICACIDVEN  --  8.2* 8.1* 4.9* 1.8  --   --   --     Liver Function Tests: Recent Labs  Lab 08/29/23 0326  AST 30  ALT 22  ALKPHOS 68  BILITOT 1.1  PROT 8.6*  ALBUMIN 2.9*   No results for input(s): "LIPASE", "AMYLASE" in the last 168 hours. No results for input(s): "AMMONIA" in the last 168 hours.  ABG    Component Value Date/Time   PHART 7.353 07/14/2019 1031   PCO2ART 56.1 (H) 07/14/2019 1031   PO2ART 92.0 07/14/2019 1031   HCO3 31.2 (H) 07/14/2019 1031   TCO2 33 (H) 07/14/2019 1031   ACIDBASEDEF 4.0 (H) 02/13/2018 0337   O2SAT 96.0 07/14/2019 1031     Coagulation Profile: Recent Labs  Lab 08/30/23 0309  INR 1.2    Cardiac Enzymes: No results for input(s): "CKTOTAL", "CKMB", "CKMBINDEX", "TROPONINI" in the last 168 hours.  HbA1C: Hgb A1c MFr Bld  Date/Time Value Ref Range Status  04/13/2022 03:48 PM 5.7 4.6 - 6.5 % Final    Comment:    Glycemic Control Guidelines for People with Diabetes:Non Diabetic:  <6%Goal of Therapy: <7%Additional Action Suggested:  >8%   10/01/2021 10:56 AM 5.6 4.6 - 6.5 % Final    Comment:    Glycemic Control Guidelines for People with Diabetes:Non Diabetic:  <6%Goal of Therapy: <7%Additional Action Suggested:  >8%     CBG: No results for input(s): "GLUCAP" in the last 168 hours.    Critical care time: na    Melody Comas, MD [facility] Office: [phone]   See Amion for personal pager PCCM on call pager [phone] until 7pm. Please call Elink 7p-7a. [phone]

## 2023-09-01 NOTE — Evaluation (Signed)
 Physical Therapy Evaluation Patient Details Name: Wayne Booth MRN: [id number] DOB: 03-15-1941 Today's Date: 09/01/2023  History of Present Illness  Pt is 83 yo male admitted on 08/29/23 with severe sepsis secondary to multifocal PNE.  Pt with hx including but not limited to COPD, ILD, chronic resp failure on 5 L o2, OSA, CAD, arthritis, afib, nephrectomy, ACDF, craniotomy, bil TKA  Clinical Impression  Pt admitted with above diagnosis. At baseline, pt resides at home, ambulates without AD, on 4-5 L O2.  Today, pt able to stand and ambulate 51' with RW and CGA, but fatigues easily.  Pt motivated and expected to progress well with therapy.  He could benefit from rollator at home for stability, energy conservation, and rest breaks. Also, recommend HHPT.  Pt currently with functional limitations due to the deficits listed below (see PT Problem List). Pt will benefit from acute skilled PT to increase their independence and safety with mobility to allow discharge.           If plan is discharge home, recommend the following: A little help with walking and/or transfers;A little help with bathing/dressing/bathroom;Assistance with cooking/housework;Help with stairs or ramp for entrance   Can travel by private vehicle        Equipment Recommendations Rollator (4 wheels)  Recommendations for Other Services       Functional Status Assessment Patient has had a recent decline in their functional status and demonstrates the ability to make significant improvements in function in a reasonable and predictable amount of time.     Precautions / Restrictions Precautions Precautions: Fall Precaution/Restrictions Comments: monitor O2      Mobility  Bed Mobility               General bed mobility comments: in chair-nursing assisted    Transfers Overall transfer level: Needs assistance Equipment used: Rolling walker (2 wheels) Transfers: Sit to/from Stand Sit to Stand: Contact guard  assist                Ambulation/Gait Ambulation/Gait assistance: Contact guard assist Gait Distance (Feet): 50 Feet Assistive device: Rolling walker (2 wheels) Gait Pattern/deviations: Step-to pattern, Decreased stride length, Trunk flexed Gait velocity: decreased     General Gait Details: Steady wtih RW but fatigued easily  Stairs            Wheelchair Mobility     Tilt Bed    Modified Rankin (Stroke Patients Only)       Balance Overall balance assessment: Needs assistance Sitting-balance support: No upper extremity supported Sitting balance-Leahy Scale: Good     Standing balance support: No upper extremity supported, Bilateral upper extremity supported Standing balance-Leahy Scale: Fair Standing balance comment: RW to ambulate but able to stand without AD                             Pertinent Vitals/Pain Pain Assessment Pain Assessment: No/denies pain    Home Living Family/patient expects to be discharged to:: Private residence Living Arrangements: Spouse/significant other Available Help at Discharge: Family;Available 24 hours/day Type of Home: Other(Comment) (condo) Home Access: Level entry       Home Layout: Multi-level;Able to live on main level with bedroom/bathroom Home Equipment: Grab bars - tub/shower;Shower Counsellor (2 wheels);Cane - single point Additional Comments: Pt on 4-5 L O2 at home    Prior Function Prior Level of Function : Independent/Modified Independent  Mobility Comments: Walked without AD; mainly just limited community distances ADLs Comments: Independent with adls and light iadls     Extremity/Trunk Assessment   Upper Extremity Assessment Upper Extremity Assessment: Overall WFL for tasks assessed    Lower Extremity Assessment Lower Extremity Assessment: Overall WFL for tasks assessed    Cervical / Trunk Assessment Cervical / Trunk Assessment: Kyphotic  Communication         Cognition Arousal: Alert Behavior During Therapy: WFL for tasks assessed/performed   PT - Cognitive impairments: No apparent impairments                                 Cueing       General Comments General comments (skin integrity, edema, etc.): Pt on 4 L O2 with sats 93% rest and 88% ambulation taking 2 mins to recover    Exercises     Assessment/Plan    PT Assessment Patient needs continued PT services  PT Problem List Decreased strength;Cardiopulmonary status limiting activity;Decreased activity tolerance;Decreased balance;Decreased mobility;Decreased knowledge of use of DME       PT Treatment Interventions DME instruction;Therapeutic exercise;Gait training;Functional mobility training;Therapeutic activities;Patient/family education;Balance training    PT Goals (Current goals can be found in the Care Plan section)  Acute Rehab PT Goals Patient Stated Goal: return home; build endurance PT Goal Formulation: With patient Time For Goal Achievement: 09/15/23 Potential to Achieve Goals: Good    Frequency Min 2X/week     Co-evaluation               AM-PAC PT "6 Clicks" Mobility  Outcome Measure Help needed turning from your back to your side while in a flat bed without using bedrails?: A Little Help needed moving from lying on your back to sitting on the side of a flat bed without using bedrails?: A Little Help needed moving to and from a bed to a chair (including a wheelchair)?: A Little Help needed standing up from a chair using your arms (e.g., wheelchair or bedside chair)?: A Little Help needed to walk in hospital room?: A Little Help needed climbing 3-5 steps with a railing? : A Little 6 Click Score: 18    End of Session Equipment Utilized During Treatment: Gait belt Activity Tolerance: Patient tolerated treatment well Patient left: in chair;with call bell/phone within reach Nurse Communication: Mobility status PT Visit Diagnosis: Other  abnormalities of gait and mobility (R26.89);Muscle weakness (generalized) (M62.81)    Time: 2952-8413 PT Time Calculation (min) (ACUTE ONLY): 22 min   Charges:   PT Evaluation $PT Eval Low Complexity: 1 Low   PT General Charges $$ ACUTE PT VISIT: 1 Visit         Anise Salvo, PT Acute Rehab [facility] [phone]   Rayetta Humphrey 09/01/2023, 5:42 PM

## 2023-09-01 NOTE — Plan of Care (Signed)
  Problem: Fluid Volume: Goal: Hemodynamic stability will improve Outcome: Progressing   Problem: Clinical Measurements: Goal: Diagnostic test results will improve Outcome: Progressing Goal: Signs and symptoms of infection will decrease Outcome: Progressing   Problem: Respiratory: Goal: Ability to maintain adequate ventilation will improve Outcome: Progressing   Problem: Education: Goal: Knowledge of General Education information will improve Description: Including pain rating scale, medication(s)/side effects and non-pharmacologic comfort measures Outcome: Progressing   Problem: Health Behavior/Discharge Planning: Goal: Ability to manage health-related needs will improve Outcome: Progressing   Problem: Clinical Measurements: Goal: Diagnostic test results will improve Outcome: Progressing Goal: Respiratory complications will improve Outcome: Progressing Goal: Cardiovascular complication will be avoided Outcome: Progressing   Problem: Activity: Goal: Risk for activity intolerance will decrease Outcome: Progressing   Problem: Nutrition: Goal: Adequate nutrition will be maintained Outcome: Progressing   Problem: Coping: Goal: Level of anxiety will decrease Outcome: Progressing   Problem: Pain Managment: Goal: General experience of comfort will improve and/or be controlled Outcome: Progressing   Problem: Safety: Goal: Ability to remain free from injury will improve Outcome: Progressing

## 2023-09-02 DIAGNOSIS — J9621 Acute and chronic respiratory failure with hypoxia: Secondary | ICD-10-CM | POA: Diagnosis not present

## 2023-09-02 DIAGNOSIS — A419 Sepsis, unspecified organism: Secondary | ICD-10-CM | POA: Diagnosis not present

## 2023-09-02 DIAGNOSIS — R6520 Severe sepsis without septic shock: Secondary | ICD-10-CM | POA: Diagnosis not present

## 2023-09-02 DIAGNOSIS — J849 Interstitial pulmonary disease, unspecified: Secondary | ICD-10-CM | POA: Diagnosis not present

## 2023-09-02 MED ORDER — PREDNISONE 20 MG PO TABS: 20.0000 mg | ORAL_TABLET | Freq: Every day | ORAL | Status: DC

## 2023-09-02 MED ORDER — FUROSEMIDE 10 MG/ML IJ SOLN
40.0000 mg | Freq: Once | INTRAMUSCULAR | Status: AC
  Administered 2023-09-02: 40 mg via INTRAVENOUS
  Filled 2023-09-02: qty 4

## 2023-09-02 MED ORDER — PREDNISONE 20 MG PO TABS
40.0000 mg | ORAL_TABLET | Freq: Every day | ORAL | Status: AC
  Administered 2023-09-03 – 2023-09-05 (×3): 40 mg via ORAL
  Filled 2023-09-02 (×3): qty 2

## 2023-09-02 MED ORDER — PREDNISONE 10 MG PO TABS: 10.0000 mg | ORAL_TABLET | Freq: Every day | ORAL | Status: DC

## 2023-09-02 MED ORDER — PREDNISONE 5 MG PO TABS: 5.0000 mg | ORAL_TABLET | Freq: Every day | ORAL | Status: DC

## 2023-09-02 MED ORDER — PREDNISONE 10 MG PO TABS
30.0000 mg | ORAL_TABLET | Freq: Every day | ORAL | Status: DC
  Administered 2023-09-06 – 2023-09-07 (×2): 30 mg via ORAL
  Filled 2023-09-02 (×2): qty 1

## 2023-09-02 NOTE — Progress Notes (Signed)
   09/02/23 2102  BiPAP/CPAP/SIPAP  BiPAP/CPAP/SIPAP Pt Type Adult  Reason BIPAP/CPAP not in use Non-compliant (Ptr refusing cpap for the night)  BiPAP/CPAP /SiPAP Vitals  SpO2 96 %  Bilateral Breath Sounds Diminished

## 2023-09-02 NOTE — Consult Note (Signed)
 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 In ED, reporting increased work of breathing over the past 1 week. Presented tachycardic, afebrile, tachypneic. COVID/flu/RSV negative, BNP 38, troponin 4, WBC 9.1, CMP grossly normal. Blood cultures drawn. Initial lactic 8.1>4.9. He had CTA PE study negative for pulmonary embolism. Demonstrated known ILD with likely superimposed multifocal pneumonia. On 3/3 pulmonary consulted for assistance.   On my exam, older male, pleasant. He is objectively dyspneic but not in extremis. On 6LNC. He subjectively feels short of breath. Denies new or different cough, fever or chills, decreased appetite, N/V/D, sick contacts. He denies chest pain or palpitations. He states he wears his oxygen "most of the time." Does not wear a CPAP. 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 rate 15, height 5\' 11"  (1.803 m), weight 106 kg, SpO2 97%.        Intake/Output Summary (Last 24 hours) at 09/02/2023 0715 Last data filed at 09/01/2023 1721 Gross  per 24 hour  Intake 840 ml  Output 1600 ml  Net -760 ml   Filed Weights   08/29/23 1354  Weight: 106 kg    Examination: General: elderly male, chronically ill appearing, no acute distress, resting in bed HEENT: [place]/AT, moist mucous membranes, sclera anicteric Neuro: A&O x 3, moving all extremities CV: rrr, s1s2, no murmurs PULM: diminished breath sounds, crackles at bases GI: soft, non-tender, non-distended, BS+ Extremities: warm, no edema Skin: no rashes   Labs: Sputum culture with gram negative rods, gram positive rods, gram positive cocci  Resolved Hospital Problem list     Assessment & Plan:  Acute on chronic hypoxic respiratory failure  Pneumonia ILD/Pulmonary Fibrosis on 5LNC home O2 OSA not on CPAP  5 days worsening dyspnea. Does not feel he could walk to his mailbox which he normally does. Dyspneic at rest. No infectious symptoms at home. On 6LNC on admission. CTA PE negative for embolism. Does show chronic ILD with possible overlying multifocal pneumonia. Trop negative. BNP wnl. Covid/rsv/flu negative. - Extended viral panel negative. Strep urine ag negative - f/u sputum culture speciation - Echo with normal EF, RV not well visualized - Continue brovana/pulmicort/yupelri  - continue solu-medrol 40mg  daily, last dose today - prednisone taper to start 3/7 - Continue ceftriaxone and azithromycin for 5 days - Give lasix IV 40mg  today - PT evaluation completed 3/5 - will need outpatient pulm follow-up scheduled - pulm will follow   Below per primary Atrial fibrillation  CAD HTN Best Practice (right click and "Reselect all SmartList Selections" daily)   Per primary  Labs   CBC: Recent Labs  Lab 08/29/23  2956 08/30/23 0309 09/01/23 0843  WBC 9.1 15.6* 12.1*  HGB 15.7 13.2 15.4  HCT 49.2 41.8 49.9  MCV 94.6 95.4 96.9  PLT 340 277 323    Basic Metabolic Panel: Recent Labs  Lab 08/29/23 0326 08/29/23 1429 08/30/23 0309 09/01/23 0843  NA 135 134*  137 139  K 3.7 4.6 4.7 4.3  CL 102 102 107 98  CO2 26 18* 25 33*  GLUCOSE 138* 209* 94 90  BUN 17 15 18 18   CREATININE 1.13 1.22 0.97 0.92  CALCIUM 8.7* 8.2* 8.2* 8.5*   GFR: Estimated Creatinine Clearance: 78 mL/min (by C-G formula based on SCr of 0.92 mg/dL). Recent Labs  Lab 08/29/23 0326 08/29/23 0920 08/29/23 1219 08/29/23 1429 08/29/23 1910 08/30/23 0309 08/30/23 1222 09/01/23 0843  PROCALCITON  --   --   --   --   --   --  <0.10  --   WBC 9.1  --   --   --   --  15.6*  --  12.1*  LATICACIDVEN  --  8.2* 8.1* 4.9* 1.8  --   --   --     Liver Function Tests: Recent Labs  Lab 08/29/23 0326  AST 30  ALT 22  ALKPHOS 68  BILITOT 1.1  PROT 8.6*  ALBUMIN 2.9*   No results for input(s): "LIPASE", "AMYLASE" in the last 168 hours. No results for input(s): "AMMONIA" in the last 168 hours.  ABG    Component Value Date/Time   PHART 7.353 07/14/2019 1031   PCO2ART 56.1 (H) 07/14/2019 1031   PO2ART 92.0 07/14/2019 1031   HCO3 31.2 (H) 07/14/2019 1031   TCO2 33 (H) 07/14/2019 1031   ACIDBASEDEF 4.0 (H) 02/13/2018 0337   O2SAT 96.0 07/14/2019 1031     Coagulation Profile: Recent Labs  Lab 08/30/23 0309  INR 1.2    Cardiac Enzymes: No results for input(s): "CKTOTAL", "CKMB", "CKMBINDEX", "TROPONINI" in the last 168 hours.  HbA1C: Hgb A1c MFr Bld  Date/Time Value Ref Range Status  04/13/2022 03:48 PM 5.7 4.6 - 6.5 % Final    Comment:    Glycemic Control Guidelines for People with Diabetes:Non Diabetic:  <6%Goal of Therapy: <7%Additional Action Suggested:  >8%   10/01/2021 10:56 AM 5.6 4.6 - 6.5 % Final    Comment:    Glycemic Control Guidelines for People with Diabetes:Non Diabetic:  <6%Goal of Therapy: <7%Additional Action Suggested:  >8%     CBG: Recent Labs  Lab 09/01/23 2243  GLUCAP 189*      Critical care time: na    Melody Comas, MD [facility] Office: [phone]   See Amion for personal pager PCCM on call  pager [phone] until 7pm. Please call Elink 7p-7a. [phone]

## 2023-09-02 NOTE — Plan of Care (Signed)
  Problem: Clinical Measurements: Goal: Diagnostic test results will improve Outcome: Progressing Goal: Signs and symptoms of infection will decrease Outcome: Progressing   Problem: Respiratory: Goal: Ability to maintain adequate ventilation will improve Outcome: Progressing   Problem: Clinical Measurements: Goal: Diagnostic test results will improve Outcome: Progressing Goal: Respiratory complications will improve Outcome: Progressing Goal: Cardiovascular complication will be avoided Outcome: Progressing

## 2023-09-02 NOTE — Progress Notes (Addendum)
 PROGRESS NOTE  Wayne Booth  QIO:[id number] DOB: 1940/10/21 DOA: 08/29/2023 PCP: Shelva Majestic, MD   Brief Narrative: Patient is a 83 year old male with history of COPD, ILD, chronic hypoxic respiratory failure on 5 L of oxygen per minute, OSA who presented with shortness of breath from home.  Chest imaging on presentation showed multifocal bilateral consolidative changes.  Patient was admitted for the management of acute proximal respiratory failure secondary to multifocal pneumonia.  Currently on 5 L of oxygen per minute which is his baseline.  PCCM following.  Assessment & Plan:  Principal Problem:   Severe sepsis (HCC) Active Problems:   OSA (obstructive sleep apnea)   Chronic respiratory failure with hypoxia (HCC)   Aortic atherosclerosis (HCC)   ILD (interstitial lung disease) (HCC)   Nodule of right lung   Lobar pneumonia (HCC)   Severe sepsis secondary to multifocal pneumonia: Presented with worsening dyspnea.  Chest imaging showed multifocal bilateral consolidative changes.  Lactic acid was elevated to 8 on presentation.  Given IV fluid, started on antibiotics.  Blood cultures have not shown any growth.  Sputum culture showing mixed organisms likely normal respiratory flora: No staph or Pseudomonas.  Treated with ceftriaxone and azithromycin.  Completed antibiotics course. respiratory viral panel, COVID/flu/RSV negative.  Chronic hypoxic respiratory failure/history of COPD/ILD: On 5 L of oxygen at home.  Chest imaging as above.  CT angiogram did not show any PE. Also being given a steroid, Lasix oral.  Patient is still significantly dyspneic at times.  Echo had shown EF of 65 to 70%.  Continue current bronchodilators.  Will give him another dose of IV Lasix today.  Right middle lung nodule: 1.3 cm.  He needs to follow-up with pulmonology as an outpatient.  Depression/anxiety: On buspirone, Lexapro trazodone at home.  Debility/deconditioning: Complains of not feeling well.   Very weak.  PT recommended HH.  Lives with his wife and ambulates well at home        DVT prophylaxis:enoxaparin (LOVENOX) injection 40 mg Start: 08/29/23 1000     Code Status: Full Code  Family Communication: Called and discussed with spouse Jody on 3/6  Patient status:Inpatient  Patient is from :home  Anticipated discharge LK:GMWN  Estimated DC date:1-2  days   Consultants: PCCM  Procedures:None  Antimicrobials:  Anti-infectives (From admission, onward)    Start     Dose/Rate Route Frequency Ordered Stop   08/30/23 1000  cefTRIAXone (ROCEPHIN) 2 g in sodium chloride 0.9 % 100 mL IVPB        2 g 200 mL/hr over 30 Minutes Intravenous Daily 08/29/23 0937 09/03/23 0959   08/30/23 1000  azithromycin (ZITHROMAX) 500 mg in sodium chloride 0.9 % 250 mL IVPB        500 mg 250 mL/hr over 60 Minutes Intravenous Daily 08/29/23 0937 09/02/23 1043   08/29/23 0845  cefTRIAXone (ROCEPHIN) 2 g in sodium chloride 0.9 % 100 mL IVPB        2 g 200 mL/hr over 30 Minutes Intravenous  Once 08/29/23 0830 08/29/23 1007   08/29/23 0845  azithromycin (ZITHROMAX) 500 mg in sodium chloride 0.9 % 250 mL IVPB        500 mg 250 mL/hr over 60 Minutes Intravenous  Once 08/29/23 0830 08/29/23 0940       Subjective: Patient seen and examined at bedside today.  He was sitting in a chair.  He feels slightly better than yesterday.  On 4 L of oxygen per minute.  But he  still appears weak.  Alert and oriented.  Not in respiratory distress.  No complaint of worsening cough.  Objective: Vitals:   09/02/23 0613 09/02/23 0809 09/02/23 0812 09/02/23 0842  BP: 116/71   (!) 142/77  Pulse: 81   81  Resp: 15   19  Temp: (!) 97.5 F (36.4 C)   97.7 F (36.5 C)  TempSrc: Oral   Oral  SpO2: 97% 97% 97% 98%  Weight:      Height:        Intake/Output Summary (Last 24 hours) at 09/02/2023 1109 Last data filed at 09/01/2023 1721 Gross per 24 hour  Intake 240 ml  Output 1000 ml  Net -760 ml   Filed  Weights   08/29/23 1354  Weight: 106 kg    Examination:  General exam: Chronically ill looking, weak, deconditioned HEENT: PERRL Respiratory system: Bilateral crackles Cardiovascular system: S1 & S2 heard, RRR.  Gastrointestinal system: Abdomen is nondistended, soft and nontender. Central nervous system: Alert and oriented Extremities: No edema, no clubbing ,no cyanosis Skin: No rashes, no ulcers,no icterus     Data Reviewed: I have personally reviewed following labs and imaging studies  CBC: Recent Labs  Lab 08/29/23 0326 08/30/23 0309 09/01/23 0843  WBC 9.1 15.6* 12.1*  HGB 15.7 13.2 15.4  HCT 49.2 41.8 49.9  MCV 94.6 95.4 96.9  PLT 340 277 323   Basic Metabolic Panel: Recent Labs  Lab 08/29/23 0326 08/29/23 1429 08/30/23 0309 09/01/23 0843  NA 135 134* 137 139  K 3.7 4.6 4.7 4.3  CL 102 102 107 98  CO2 26 18* 25 33*  GLUCOSE 138* 209* 94 90  BUN 17 15 18 18   CREATININE 1.13 1.22 0.97 0.92  CALCIUM 8.7* 8.2* 8.2* 8.5*     Recent Results (from the past 240 hours)  Resp panel by RT-PCR (RSV, Flu A&B, Covid) Anterior Nasal Swab     Status: None   Collection Time: 08/29/23  3:26 AM   Specimen: Anterior Nasal Swab  Result Value Ref Range Status   SARS Coronavirus 2 by RT PCR NEGATIVE NEGATIVE Final    Comment: (NOTE) SARS-CoV-2 target nucleic acids are NOT DETECTED.  The SARS-CoV-2 RNA is generally detectable in upper respiratory specimens during the acute phase of infection. The lowest concentration of SARS-CoV-2 viral copies this assay can detect is 138 copies/mL. A negative result does not preclude SARS-Cov-2 infection and should not be used as the sole basis for treatment or other patient management decisions. A negative result may occur with  improper specimen collection/handling, submission of specimen other than nasopharyngeal swab, presence of viral mutation(s) within the areas targeted by this assay, and inadequate number of viral copies(<138  copies/mL). A negative result must be combined with clinical observations, patient history, and epidemiological information. The expected result is Negative.  Fact Sheet for Patients:  BloggerCourse.com  Fact Sheet for Healthcare Providers:  SeriousBroker.it  This test is no t yet approved or cleared by the [place] FDA and  has been authorized for detection and/or diagnosis of SARS-CoV-2 by FDA under an Emergency Use Authorization (EUA). This EUA will remain  in effect (meaning this test can be used) for the duration of the COVID-19 declaration under Section 564(b)(1) of the Act, 21 U.S.C.section 360bbb-3(b)(1), unless the authorization is terminated  or revoked sooner.       Influenza A by PCR NEGATIVE NEGATIVE Final   Influenza B by PCR NEGATIVE NEGATIVE Final    Comment: (NOTE) The  Xpert Xpress SARS-CoV-2/FLU/RSV plus assay is intended as an aid in the diagnosis of influenza from Nasopharyngeal swab specimens and should not be used as a sole basis for treatment. Nasal washings and aspirates are unacceptable for Xpert Xpress SARS-CoV-2/FLU/RSV testing.  Fact Sheet for Patients: BloggerCourse.com  Fact Sheet for Healthcare Providers: SeriousBroker.it  This test is not yet approved or cleared by the [place] FDA and has been authorized for detection and/or diagnosis of SARS-CoV-2 by FDA under an Emergency Use Authorization (EUA). This EUA will remain in effect (meaning this test can be used) for the duration of the COVID-19 declaration under Section 564(b)(1) of the Act, 21 U.S.C. section 360bbb-3(b)(1), unless the authorization is terminated or revoked.     Resp Syncytial Virus by PCR NEGATIVE NEGATIVE Final    Comment: (NOTE) Fact Sheet for Patients: BloggerCourse.com  Fact Sheet for Healthcare  Providers: SeriousBroker.it  This test is not yet approved or cleared by the [place] FDA and has been authorized for detection and/or diagnosis of SARS-CoV-2 by FDA under an Emergency Use Authorization (EUA). This EUA will remain in effect (meaning this test can be used) for the duration of the COVID-19 declaration under Section 564(b)(1) of the Act, 21 U.S.C. section 360bbb-3(b)(1), unless the authorization is terminated or revoked.  Performed at [facility], [address], [place], [place] [address]   Culture, blood (routine x 2)     Status: None (Preliminary result)   Collection Time: 08/29/23  9:00 AM   Specimen: BLOOD  Result Value Ref Range Status   Specimen Description   Final    BLOOD LEFT ANTECUBITAL Performed at [facility], [address] [place], [place] [address]    Special Requests   Final    BOTTLES DRAWN AEROBIC AND ANAEROBIC Blood Culture results may not be optimal due to an inadequate volume of blood received in culture bottles Performed at [facility], [address] [place], [place] [address]    Culture   Final    NO GROWTH 3 DAYS Performed at [facility], [address] [place], [place] [address]    Report Status PENDING  Incomplete  Culture, blood (routine x 2)     Status: None (Preliminary result)   Collection Time: 08/29/23  9:05 AM   Specimen: BLOOD  Result Value Ref Range Status   Specimen Description   Final    BLOOD LEFT ANTECUBITAL Performed at [facility], [address], [place], [place] [address]    Special Requests   Final    BOTTLES DRAWN AEROBIC AND ANAEROBIC Blood Culture results may not be optimal due to an inadequate volume of blood received in culture bottles Performed at [facility], [address], [place], [place] [address]    Culture   Final    NO GROWTH 3 DAYS Performed at [facility], [address] [place], [place] [address]    Report Status PENDING  Incomplete  Respiratory (~20 pathogens) panel by PCR     Status: None   Collection Time: 08/30/23 12:31 PM   Specimen: Nasopharyngeal Swab; Respiratory  Result Value Ref Range Status   Adenovirus NOT DETECTED NOT DETECTED Final   Coronavirus 229E NOT DETECTED NOT DETECTED Final    Comment: (NOTE) The Coronavirus on the Respiratory Panel, DOES NOT test for the novel  Coronavirus (2019 nCoV)    Coronavirus HKU1 NOT DETECTED NOT DETECTED Final   Coronavirus NL63  NOT DETECTED NOT DETECTED Final   Coronavirus OC43 NOT DETECTED NOT DETECTED Final   Metapneumovirus NOT DETECTED NOT DETECTED Final   Rhinovirus / Enterovirus NOT DETECTED NOT DETECTED Final   Influenza A NOT DETECTED NOT DETECTED Final   Influenza B NOT DETECTED NOT DETECTED Final   Parainfluenza Virus 1 NOT DETECTED NOT DETECTED Final   Parainfluenza Virus 2 NOT DETECTED NOT DETECTED Final   Parainfluenza Virus 3 NOT DETECTED NOT DETECTED Final   Parainfluenza Virus 4 NOT DETECTED NOT DETECTED Final   Respiratory Syncytial Virus NOT DETECTED NOT DETECTED Final   Bordetella pertussis NOT DETECTED NOT DETECTED Final   Bordetella Parapertussis NOT DETECTED NOT DETECTED Final   Chlamydophila pneumoniae NOT DETECTED NOT DETECTED Final   Mycoplasma pneumoniae NOT DETECTED NOT DETECTED Final    Comment: Performed at [facility], [address] [place], [place] [address]  Expectorated Sputum Assessment w Gram Stain, Rflx to Resp Cult     Status: None   Collection Time: 08/30/23 12:31 PM   Specimen: Expectorated Sputum  Result Value Ref Range Status   Specimen Description EXPECTORATED SPUTUM  Final   Special Requests NONE  Final   Sputum evaluation   Final    THIS SPECIMEN IS ACCEPTABLE FOR SPUTUM CULTURE Performed at [facility], [address] [place], [place] [address]    Report Status 08/30/2023 FINAL  Final  Culture,  Respiratory w Gram Stain     Status: None   Collection Time: 08/30/23 12:31 PM  Result Value Ref Range Status   Specimen Description   Final    EXPECTORATED SPUTUM Performed at [facility], [address], [place], [place] [address]    Special Requests   Final    NONE Reflexed from [id number] Performed at [facility], [address] [place], [place] [address]    Gram Stain   Final    MODERATE WBC PRESENT, PREDOMINANTLY PMN RARE GRAM NEGATIVE RODS RARE GRAM POSITIVE RODS RARE GRAM POSITIVE COCCI    Culture   Final    Normal respiratory flora-no Staph aureus or Pseudomonas seen Performed at [facility], [address] [place], [place] [address]    Report Status 09/01/2023 FINAL  Final  MRSA Next Gen by PCR, Nasal     Status: Abnormal   Collection Time: 08/31/23 11:51 AM  Result Value Ref Range Status   MRSA by PCR Next Gen DETECTED (A) NOT DETECTED Final    Comment: (NOTE) The GeneXpert MRSA Assay (FDA approved for NASAL specimens only), is one component of a comprehensive MRSA colonization surveillance program. It is not intended to diagnose MRSA infection nor to guide or monitor treatment for MRSA infections. Test performance is not FDA approved in patients less than 72 years old. Performed at [facility], [address], [place], [place] [address]      Radiology Studies: No results found.   Scheduled Meds:  arformoterol  15 mcg Nebulization BID   budesonide (PULMICORT) nebulizer solution  0.25 mg Nebulization BID   enoxaparin (LOVENOX) injection  40 mg Subcutaneous Daily   escitalopram  20 mg Oral Daily   gabapentin  300 mg Oral TID   methylPREDNISolone (SOLU-MEDROL) injection  40 mg Intravenous Daily   revefenacin  175 mcg Nebulization Daily   Continuous Infusions:  cefTRIAXone (ROCEPHIN)  IV 2 g (09/01/23 0945)     LOS: 4 days   Burnadette Pop, MD [facility] P3/11/2023, 11:09  AM

## 2023-09-02 NOTE — Progress Notes (Signed)
   09/01/23 2227  Vitals  Temp 98.5 F (36.9 C)  Temp Source Oral  BP 105/69  MAP (mmHg) 78  BP Location Right Arm  BP Method Automatic  Patient Position (if appropriate) Sitting  Pulse Rate 86  Pulse Rate Source Monitor  Resp (!) 24  MEWS COLOR  MEWS Score Color Green  Oxygen Therapy  SpO2 95 %  O2 Device Nasal Cannula  O2 Flow Rate (L/min) 4 L/min  MEWS Score  MEWS Temp 0  MEWS Systolic 0  MEWS Pulse 0  MEWS RR 1  MEWS LOC 0  MEWS Score 1   Pt c/o feeling like he has "bottomed out", extremely weak and not feeling as well as had earlier in shift. Pt POC CBG 189 . Lung fields are clear and diminished, same as prior assessment. Provider made aware. Pt encourage to increase PO intake and writer advised to repeat VS in 2 hours. Repeat BP improved  121/77 (map 90), HR 86, RR 17, Temp 98.2, SpO2 98% via 4L [place] and pt states that he feels a lot better.

## 2023-09-03 ENCOUNTER — Telehealth: Payer: Self-pay | Admitting: Internal Medicine

## 2023-09-03 DIAGNOSIS — J9601 Acute respiratory failure with hypoxia: Secondary | ICD-10-CM

## 2023-09-03 DIAGNOSIS — A419 Sepsis, unspecified organism: Secondary | ICD-10-CM | POA: Diagnosis not present

## 2023-09-03 DIAGNOSIS — J849 Interstitial pulmonary disease, unspecified: Secondary | ICD-10-CM | POA: Diagnosis not present

## 2023-09-03 DIAGNOSIS — G4733 Obstructive sleep apnea (adult) (pediatric): Secondary | ICD-10-CM | POA: Diagnosis not present

## 2023-09-03 DIAGNOSIS — R6520 Severe sepsis without septic shock: Secondary | ICD-10-CM | POA: Diagnosis not present

## 2023-09-03 LAB — BASIC METABOLIC PANEL
Anion gap: 8 (ref 5–15)
BUN: 26 mg/dL — ABNORMAL HIGH (ref 8–23)
CO2: 34 mmol/L — ABNORMAL HIGH (ref 22–32)
Calcium: 8.1 mg/dL — ABNORMAL LOW (ref 8.9–10.3)
Chloride: 94 mmol/L — ABNORMAL LOW (ref 98–111)
Creatinine, Ser: 1.04 mg/dL (ref 0.61–1.24)
GFR, Estimated: >60 mL/min (ref >60)
Glucose, Bld: 127 mg/dL — ABNORMAL HIGH (ref 70–99)
Potassium: 4.3 mmol/L (ref 3.5–5.1)
Sodium: 136 mmol/L (ref 135–145)

## 2023-09-03 LAB — CBC
HCT: 45.6 % (ref 39.0–52.0)
Hemoglobin: 14.7 g/dL (ref 13.0–17.0)
MCH: 30.4 pg (ref 26.0–34.0)
MCHC: 32.2 g/dL (ref 30.0–36.0)
MCV: 94.2 fL (ref 80.0–100.0)
Platelets: 305 10*3/uL (ref 150–400)
RBC: 4.84 MIL/uL (ref 4.22–5.81)
RDW: 13.7 % (ref 11.5–15.5)
WBC: 13.2 10*3/uL — ABNORMAL HIGH (ref 4.0–10.5)
nRBC: 0 % (ref 0.0–0.2)

## 2023-09-03 LAB — CULTURE, BLOOD (ROUTINE X 2)
Culture: NO GROWTH
Culture: NO GROWTH

## 2023-09-03 MED ORDER — FUROSEMIDE 10 MG/ML IJ SOLN
40.0000 mg | Freq: Once | INTRAMUSCULAR | Status: AC
  Administered 2023-09-03: 40 mg via INTRAVENOUS
  Filled 2023-09-03: qty 4

## 2023-09-03 MED ORDER — CHLORHEXIDINE GLUCONATE CLOTH 2 % EX PADS
6.0000 | MEDICATED_PAD | Freq: Every day | CUTANEOUS | Status: DC
  Administered 2023-09-03 – 2023-09-04 (×2): 6 via TOPICAL

## 2023-09-03 MED ORDER — MUPIROCIN 2 % EX OINT
1.0000 | TOPICAL_OINTMENT | Freq: Two times a day (BID) | CUTANEOUS | Status: DC
  Administered 2023-09-03 – 2023-09-07 (×9): 1 via NASAL
  Filled 2023-09-03 (×2): qty 22

## 2023-09-03 NOTE — TOC Initial Note (Signed)
 Transition of Care [facility]) - Initial/Assessment Note    Patient Details  Name: Wayne Booth MRN: [id number] Date of Birth: 07-27-1940  [facility] CM/SW Contact:    Larrie Kass, LCSW Phone Number: 09/03/2023, 11:54 AM  Clinical Narrative:                 CSW met with the patient at the bedside to discuss recommendations for home health and DME. The patient agreed with home health, stating that he does not remember the agency he had in the past. CSW discussed nebulizer needs. The patient is on home oxygen and did not know which company provides the equipment. He stated to contact his wife. CSW attempted to contact the patient's wife to discuss the recommended DME, but there was no answer. A voicemail was left requesting a return call. TOC to follow  CSW spoke with the patient's wife at the bedside to discuss recommendations for DME and home health services. The patient's wife stated that the patient would like [facility] for home health. She reported that the patient receives his oxygen through [employer] and would like a nebulizer machine through the same company.  Frances [facility]urbish was able to accept the patient for HHPT/OT. CSW received a call from [facility]; they stated that the patient received a nebulizer machine last year and is not eligible for a new one. The pt would have to pay out-of-pocket.  CSW spoke with the pt and spouse. They reported that they believe they still have the machine at home and may just need supplies. CSW suggested that the pt follow up with [employer] for supplies. TOC to follow   Expected Discharge Plan: Home w Home Health Services Barriers to Discharge: Continued Medical Work up   Patient Goals and CMS Choice Patient states their goals for this hospitalization and ongoing recovery are:: return home CMS Medicare.gov Compare Post Acute Care list provided to:: Patient Choice offered to / list presented to : Patient, Spouse      Expected  Discharge Plan and Services       Living arrangements for the past 2 months: Single Family Home                                      Prior Living Arrangements/Services Living arrangements for the past 2 months: Single Family Home Lives with:: Self, Spouse Patient language and need for interpreter reviewed:: Yes Do you feel safe going back to the place where you live?: Yes      Need for Family Participation in Patient Care: Yes (Comment) Care giver support system in place?: Yes (comment)   Criminal Activity/Legal Involvement Pertinent to Current Situation/Hospitalization: No - Comment as needed  Activities of Daily Living   ADL Screening (condition at time of admission) Independently performs ADLs?: Yes (appropriate for developmental age) Is the patient deaf or have difficulty hearing?: No Does the patient have difficulty seeing, even when wearing glasses/contacts?: No Does the patient have difficulty concentrating, remembering, or making decisions?: No  Permission Sought/Granted                  Emotional Assessment Appearance:: Appears stated age Attitude/Demeanor/Rapport: Gracious, Engaged Affect (typically observed): Accepting Orientation: : Oriented to Self, Oriented to Place, Oriented to  Time, Oriented to Situation   Psych Involvement: No (comment)  Admission diagnosis:  Lobar pneumonia (HCC) [J18.1] COPD exacerbation (HCC) [J44.1] Acute  respiratory failure with hypoxia (HCC) [J96.01] Severe sepsis (HCC) [A41.9, R65.20] Patient Active Problem List   Diagnosis Date Noted   Lobar pneumonia (HCC) 08/29/2023   Severe sepsis (HCC) 08/29/2023   COPD (chronic obstructive pulmonary disease) (HCC) 08/29/2023   Palliative care patient 10/26/2022   Conjunctivitis 10/12/2022   Physical deconditioning 07/17/2022   Pharmacologic therapy 01/26/2021   Disorder of skeletal system 01/26/2021   Problems influencing health status 01/26/2021   ILD (interstitial lung  disease) (HCC) 10/13/2019   Nodule of right lung 10/13/2019   Pneumonia due to COVID-19 virus 04/20/2019   PTSD (post-traumatic stress disorder) 07/27/2018   Memory loss 06/10/2018   Aortic atherosclerosis (HCC) 05/10/2018   Chronic respiratory failure with hypoxia (HCC) 04/26/2018   First degree AV block 04/15/2018   Hyponatremia 04/15/2018   Pleural effusion 04/14/2018   CAD (coronary artery disease) 04/09/2018   Chronic pain syndrome 04/09/2018   CKD (chronic kidney disease) stage 3, GFR 30-59 ml/min (HCC)    Dyspnea    HCAP (healthcare-associated pneumonia) 02/03/2018   Constipation 01/21/2018   (HFpEF) heart failure with preserved ejection fraction (HCC) 12/16/2017   Chest pain    Anticoagulated    Atrial flutter (HCC) 11/01/2017   Pain in left knee 07/15/2017   Bilateral foot pain 07/13/2017   Community acquired pneumonia 06/18/2017   Status post nephrectomy 06/09/2017   VTE (venous thromboembolism) 06/09/2017   History of adenomatous polyp of colon 04/12/2017   Chronic pulmonary embolism (HCC) 10/23/2016   DDD (degenerative disc disease), cervical 11/13/2015   Essential tremor 08/14/2015   Gout 08/14/2015   History of fusion of cervical spine 04/24/2015   Hypothyroidism 07/12/2014   Hyperglycemia 07/12/2014   GAD (generalized anxiety disorder) 11/07/2013   OSA (obstructive sleep apnea) 07/07/2011   Asthmatic bronchitis 06/09/2011   Actinic keratosis 11/27/2009   History of total knee replacement, 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  Dr. Laverle Patter 10/02/2008   History of renal cell carcinoma 12/26/2007   Insomnia 12/26/2007   TESTOSTERONE DEFICIENCY 12/28/2006   Essential hypertension 12/28/2006   History of total knee replacement, left 10/28/2006   PCP:  Shelva Majestic, MD Pharmacy:   [facility] [id number] - [address], [place] - [address] [place]  [address] Phone: [phone] Fax: [phone]  [facility]) - [place], [place] - [address] [facility] [address] [place] [address] [place] [address] Phone: [phone] Fax: [phone]  [facility] [address] [place] [address] Phone: [phone] Fax: [phone]     Social Drivers of Health (SDOH) Social History: SDOH Screenings   Food Insecurity: No Food Insecurity (08/29/2023)  Housing: Low Risk  (08/29/2023)  Transportation Needs: No Transportation Needs (08/29/2023)  Utilities: Not At Risk (08/29/2023)  Depression (PHQ2-9): High Risk (02/11/2023)  Financial Resource Strain: Low Risk  (01/04/2023)  Physical Activity: Inactive (01/04/2023)  Social Connections: Moderately Integrated (08/29/2023)  Stress: No Stress Concern Present (01/04/2023)  Tobacco Use: Low Risk  (08/29/2023)   SDOH Interventions:     Readmission Risk Interventions     No data to display

## 2023-09-03 NOTE — Progress Notes (Signed)
 PROGRESS NOTE  [name]  XBJ:[id number] DOB: 02-27-1941 DOA: 08/29/2023 PCP: Wayne Majestic, MD   Brief Narrative: Patient is a 83 year old male with history of COPD, ILD, chronic hypoxic respiratory failure on 5 L of oxygen per minute, OSA who presented with shortness of breath from home.  Chest imaging on presentation showed multifocal bilateral consolidative changes.  Patient was admitted for the management of acute proximal respiratory failure secondary to multifocal pneumonia.  Currently on 5 L of oxygen per minute which is his baseline.  PCCM were following.  Now clinically improving with IV diuresis.  Discharge planning in next 1 to 2 days.  Assessment & Plan:  Principal Problem:   Severe sepsis (HCC) Active Problems:   OSA (obstructive sleep apnea)   Chronic respiratory failure with hypoxia (HCC)   Aortic atherosclerosis (HCC)   ILD (interstitial lung disease) (HCC)   Nodule of right lung   Lobar pneumonia (HCC)   Severe sepsis secondary to multifocal pneumonia: Presented with worsening dyspnea.  Chest imaging showed multifocal bilateral consolidative changes.  Lactic acid was elevated to 8 on presentation.  Given IV fluid, started on antibiotics.  Blood cultures have not shown any growth.  Sputum culture showing mixed organisms likely normal respiratory flora: No staph or Pseudomonas.  Treated with ceftriaxone and azithromycin.  Completed antibiotics course. respiratory viral panel, COVID/flu/RSV negative.  Chronic hypoxic respiratory failure/history of COPD/ILD: On 5 L of oxygen at home.  Chest imaging as above.  CT angiogram did not show any PE. Also started on  steroid, with slow taper. Echo had shown EF of 65 to 70%.  Continue current bronchodilators.  He was dyspneic  likely from volume overload, he got fluids during this hospitalization.  Dyspnea is significantly better with diuresis.  Kidney function preserved.  Will give him another dose of IV Lasix 40 mg once  today. He will follow-up with Wayne Booth after discharge  Right middle lung nodule: 1.3 cm.  He needs to follow-up with pulmonology as an outpatient.  Depression/anxiety: On buspirone, Lexapro trazodone at home.  Debility/deconditioning:.  PT recommended HH.  Lives with his wife and ambulates well at home        DVT prophylaxis:enoxaparin (LOVENOX) injection 40 mg Start: 08/29/23 1000     Code Status: Full Code  Family Communication: Called and discussed with spouse Wayne Booth on 3/6  Patient status:Inpatient  Patient is from :home  Anticipated discharge HY:QMVH  Estimated DC date:1-2  days   Consultants: PCCM  Procedures:None  Antimicrobials:  Anti-infectives (From admission, onward)    Start     Dose/Rate Route Frequency Ordered Stop   08/30/23 1000  cefTRIAXone (ROCEPHIN) 2 g in sodium chloride 0.9 % 100 mL IVPB        2 g 200 mL/hr over 30 Minutes Intravenous Daily 08/29/23 0937 09/02/23 1230   08/30/23 1000  azithromycin (ZITHROMAX) 500 mg in sodium chloride 0.9 % 250 mL IVPB        500 mg 250 mL/hr over 60 Minutes Intravenous Daily 08/29/23 0937 09/02/23 1043   08/29/23 0845  cefTRIAXone (ROCEPHIN) 2 g in sodium chloride 0.9 % 100 mL IVPB        2 g 200 mL/hr over 30 Minutes Intravenous  Once 08/29/23 0830 08/29/23 1007   08/29/23 0845  azithromycin (ZITHROMAX) 500 mg in sodium chloride 0.9 % 250 mL IVPB        500 mg 250 mL/hr over 60 Minutes Intravenous  Once 08/29/23 0830 08/29/23 0940  Subjective: Patient seen and examined at bedside today.  He feels better today.  Lying on bed.  On 5 L of oxygen which is his baseline.  Denies any worsening shortness of breath or cough.  Not ready to go home yet  Objective: Vitals:   09/02/23 2102 09/03/23 0625 09/03/23 0818 09/03/23 0820  BP:  127/76    Pulse:  73    Resp:  16    Temp:  97.6 F (36.4 C)    TempSrc:  Oral    SpO2: 96% 98% 98% 98%  Weight:      Height:        Intake/Output Summary (Last 24  hours) at 09/03/2023 1105 Last data filed at 09/03/2023 0600 Gross per 24 hour  Intake 540 ml  Output 4600 ml  Net -4060 ml   Filed Weights   08/29/23 1354  Weight: 106 kg    Examination:   General exam: Chronically ill looking, weak, deconditioned, obese HEENT: PERRL Respiratory system: Bilateral crackles Cardiovascular system: S1 & S2 heard, RRR.  Gastrointestinal system: Abdomen is nondistended, soft and nontender. Central nervous system: Alert and oriented Extremities: No edema, no clubbing ,no cyanosis Skin: No rashes, no ulcers,no icterus     Data Reviewed: I have personally reviewed following labs and imaging studies  CBC: Recent Labs  Lab 08/29/23 0326 08/30/23 0309 09/01/23 0843 09/03/23 0357  WBC 9.1 15.6* 12.1* 13.2*  HGB 15.7 13.2 15.4 14.7  HCT 49.2 41.8 49.9 45.6  MCV 94.6 95.4 96.9 94.2  PLT 340 277 323 305   Basic Metabolic Panel: Recent Labs  Lab 08/29/23 0326 08/29/23 1429 08/30/23 0309 09/01/23 0843 09/03/23 0357  NA 135 134* 137 139 136  K 3.7 4.6 4.7 4.3 4.3  CL 102 102 107 98 94*  CO2 26 18* 25 33* 34*  GLUCOSE 138* 209* 94 90 127*  BUN 17 15 18 18  26*  CREATININE 1.13 1.22 0.97 0.92 1.04  CALCIUM 8.7* 8.2* 8.2* 8.5* 8.1*     Recent Results (from the past 240 hours)  Resp panel by RT-PCR (RSV, Flu A&B, Covid) Anterior Nasal Swab     Status: None   Collection Time: 08/29/23  3:26 AM   Specimen: Anterior Nasal Swab  Result Value Ref Range Status   SARS Coronavirus 2 by RT PCR NEGATIVE NEGATIVE Final    Comment: (NOTE) SARS-CoV-2 target nucleic acids are NOT DETECTED.  The SARS-CoV-2 RNA is generally detectable in upper respiratory specimens during the acute phase of infection. The lowest concentration of SARS-CoV-2 viral copies this assay can detect is 138 copies/mL. A negative result does not preclude SARS-Cov-2 infection and should not be used as the sole basis for treatment or other patient management decisions. A negative  result may occur with  improper specimen collection/handling, submission of specimen other than nasopharyngeal swab, presence of viral mutation(s) within the areas targeted by this assay, and inadequate number of viral copies(<138 copies/mL). A negative result must be combined with clinical observations, patient history, and epidemiological information. The expected result is Negative.  Fact Sheet for Patients:  BloggerCourse.com  Fact Sheet for Healthcare Providers:  SeriousBroker.it  This test is no t yet approved or cleared by the [place] FDA and  has been authorized for detection and/or diagnosis of SARS-CoV-2 by FDA under an Emergency Use Authorization (EUA). This EUA will remain  in effect (meaning this test can be used) for the duration of the COVID-19 declaration under Section 564(b)(1) of the Act, 21  U.S.C.section 360bbb-3(b)(1), unless the authorization is terminated  or revoked sooner.       Influenza A by PCR NEGATIVE NEGATIVE Final   Influenza B by PCR NEGATIVE NEGATIVE Final    Comment: (NOTE) The Xpert Xpress SARS-CoV-2/FLU/RSV plus assay is intended as an aid in the diagnosis of influenza from Nasopharyngeal swab specimens and should not be used as a sole basis for treatment. Nasal washings and aspirates are unacceptable for Xpert Xpress SARS-CoV-2/FLU/RSV testing.  Fact Sheet for Patients: BloggerCourse.com  Fact Sheet for Healthcare Providers: SeriousBroker.it  This test is not yet approved or cleared by the [place] FDA and has been authorized for detection and/or diagnosis of SARS-CoV-2 by FDA under an Emergency Use Authorization (EUA). This EUA will remain in effect (meaning this test can be used) for the duration of the COVID-19 declaration under Section 564(b)(1) of the Act, 21 U.S.C. section 360bbb-3(b)(1), unless the authorization is  terminated or revoked.     Resp Syncytial Virus by PCR NEGATIVE NEGATIVE Final    Comment: (NOTE) Fact Sheet for Patients: BloggerCourse.com  Fact Sheet for Healthcare Providers: SeriousBroker.it  This test is not yet approved or cleared by the [place] FDA and has been authorized for detection and/or diagnosis of SARS-CoV-2 by FDA under an Emergency Use Authorization (EUA). This EUA will remain in effect (meaning this test can be used) for the duration of the COVID-19 declaration under Section 564(b)(1) of the Act, 21 U.S.C. section 360bbb-3(b)(1), unless the authorization is terminated or revoked.  Performed at [facility], [address], [place], [place] [address]   Culture, blood (routine x 2)     Status: None (Preliminary result)   Collection Time: 08/29/23  9:00 AM   Specimen: BLOOD  Result Value Ref Range Status   Specimen Description   Final    BLOOD LEFT ANTECUBITAL Performed at [facility], [address], [place], [place] [address]    Special Requests   Final    BOTTLES DRAWN AEROBIC AND ANAEROBIC Blood Culture results may not be optimal due to an inadequate volume of blood received in culture bottles Performed at [facility], [address], [place], [place] [address]    Culture   Final    NO GROWTH 4 DAYS Performed at [facility], [address] [place], [place] [address]    Report Status PENDING  Incomplete  Culture, blood (routine x 2)     Status: None (Preliminary result)   Collection Time: 08/29/23  9:05 AM   Specimen: BLOOD  Result Value Ref Range Status   Specimen Description   Final    BLOOD LEFT ANTECUBITAL Performed at [facility], [address], [place], [place] [address]    Special Requests   Final    BOTTLES DRAWN AEROBIC AND ANAEROBIC Blood Culture results may not be optimal due to an inadequate  volume of blood received in culture bottles Performed at [facility], [address] [place], [place] [address]    Culture   Final    NO GROWTH 4 DAYS Performed at [facility], [address] [place], [place] [address]    Report Status PENDING  Incomplete  Respiratory (~20 pathogens) panel by PCR     Status: None   Collection Time: 08/30/23 12:31 PM   Specimen: Nasopharyngeal Swab; Respiratory  Result Value Ref Range Status   Adenovirus NOT DETECTED NOT DETECTED Final   Coronavirus 229E NOT  DETECTED NOT DETECTED Final    Comment: (NOTE) The Coronavirus on the Respiratory Panel, DOES NOT test for the novel  Coronavirus (2019 nCoV)    Coronavirus HKU1 NOT DETECTED NOT DETECTED Final   Coronavirus NL63 NOT DETECTED NOT DETECTED Final   Coronavirus OC43 NOT DETECTED NOT DETECTED Final   Metapneumovirus NOT DETECTED NOT DETECTED Final   Rhinovirus / Enterovirus NOT DETECTED NOT DETECTED Final   Influenza A NOT DETECTED NOT DETECTED Final   Influenza B NOT DETECTED NOT DETECTED Final   Parainfluenza Virus 1 NOT DETECTED NOT DETECTED Final   Parainfluenza Virus 2 NOT DETECTED NOT DETECTED Final   Parainfluenza Virus 3 NOT DETECTED NOT DETECTED Final   Parainfluenza Virus 4 NOT DETECTED NOT DETECTED Final   Respiratory Syncytial Virus NOT DETECTED NOT DETECTED Final   Bordetella pertussis NOT DETECTED NOT DETECTED Final   Bordetella Parapertussis NOT DETECTED NOT DETECTED Final   Chlamydophila pneumoniae NOT DETECTED NOT DETECTED Final   Mycoplasma pneumoniae NOT DETECTED NOT DETECTED Final    Comment: Performed at [facility], [address], [place], [place] [address]  Expectorated Sputum Assessment w Gram Stain, Rflx to Resp Cult     Status: None   Collection Time: 08/30/23 12:31 PM   Specimen: Expectorated Sputum  Result Value Ref Range Status   Specimen Description EXPECTORATED SPUTUM  Final   Special Requests NONE  Final   Sputum evaluation    Final    THIS SPECIMEN IS ACCEPTABLE FOR SPUTUM CULTURE Performed at [facility], [address], [place], [place] [address]    Report Status 08/30/2023 FINAL  Final  Culture, Respiratory w Gram Stain     Status: None   Collection Time: 08/30/23 12:31 PM  Result Value Ref Range Status   Specimen Description   Final    EXPECTORATED SPUTUM Performed at [facility], [address], [place], [place] [address]    Special Requests   Final    NONE Reflexed from [id number] Performed at [facility], [address] [place], [place] [address]    Gram Stain   Final    MODERATE WBC PRESENT, PREDOMINANTLY PMN RARE GRAM NEGATIVE RODS RARE GRAM POSITIVE RODS RARE GRAM POSITIVE COCCI    Culture   Final    Normal respiratory flora-no Staph aureus or Pseudomonas seen Performed at [facility], [address], [place], [place] [address]    Report Status 09/01/2023 FINAL  Final  MRSA Next Gen by PCR, Nasal     Status: Abnormal   Collection Time: 08/31/23 11:51 AM  Result Value Ref Range Status   MRSA by PCR Next Gen DETECTED (A) NOT DETECTED Final    Comment: (NOTE) The GeneXpert MRSA Assay (FDA approved for NASAL specimens only), is one component of a comprehensive MRSA colonization surveillance program. It is not intended to diagnose MRSA infection nor to guide or monitor treatment for MRSA infections. Test performance is not FDA approved in patients less than 89 years old. Performed at [facility], [address] [place], [place] [address]      Radiology Studies: No results found.   Scheduled Meds:  arformoterol  15 mcg Nebulization BID   budesonide (PULMICORT) nebulizer solution  0.25 mg Nebulization BID   Chlorhexidine Gluconate Cloth  6 each Topical Daily   enoxaparin (LOVENOX) injection  40 mg Subcutaneous Daily   escitalopram  20 mg Oral Daily   furosemide  40 mg  Intravenous Once   gabapentin   300 mg Oral TID   mupirocin ointment  1 Application Nasal BID   predniSONE  40 mg Oral Q breakfast   Followed by   Melene Muller ON 09/06/2023] predniSONE  30 mg Oral Q breakfast   Followed by   Melene Muller ON 09/09/2023] predniSONE  20 mg Oral Q breakfast   Followed by   Melene Muller ON 09/12/2023] predniSONE  10 mg Oral Q breakfast   Followed by   Melene Muller ON 09/15/2023] predniSONE  5 mg Oral Q breakfast   revefenacin  175 mcg Nebulization Daily   Continuous Infusions:     LOS: 5 days   Burnadette Pop, MD [facility] P3/12/2023, 11:05 AM

## 2023-09-03 NOTE — Telephone Encounter (Signed)
 Patient of Dr. Vassie Loll who has been hospitalized and needs HFU for ILD

## 2023-09-03 NOTE — Progress Notes (Signed)
 Physical Therapy Treatment Patient Details Name: Wayne Booth MRN: [id number] DOB: 05/14/1941 Today's Date: 09/03/2023   History of Present Illness Pt is 83 yo male admitted on 08/29/23 with severe sepsis secondary to multifocal PNE.  Pt with hx including but not limited to COPD, ILD, chronic resp failure on 5 L o2, OSA, CAD, arthritis, afib, nephrectomy, ACDF, craniotomy, bil TKA    PT Comments  Pt making gradual progress.  He is able to transfer and ambulate with supervision/CGA but does fatigue easily and needs seated rest breaks.  Pt educated on use of rollator and was able to use with min cues.  He liked having for availability of rest breaks.  Continue to recommend HHPT and rollator at d/c.  Pt and wife asking about walking more - encouraged daily mobility with nursing and mobility team.  Discussed on minimum of 2 x week with therapy but will try to see more as able.      If plan is discharge home, recommend the following: A little help with walking and/or transfers;A little help with bathing/dressing/bathroom;Assistance with cooking/housework;Help with stairs or ramp for entrance   Can travel by private vehicle        Equipment Recommendations  Rollator (4 wheels)    Recommendations for Other Services       Precautions / Restrictions Precautions Precautions: Fall Precaution/Restrictions Comments: monitor O2     Mobility  Bed Mobility               General bed mobility comments: in chair    Transfers Overall transfer level: Needs assistance Equipment used: Rolling walker (2 wheels) Transfers: Sit to/from Stand Sit to Stand: Supervision, Contact guard assist           General transfer comment: Close supervision from recliner x 1, BSC x 1; CGA from rollator x 2    Ambulation/Gait Ambulation/Gait assistance: Contact guard assist Gait Distance (Feet): 50 Feet (40'x2, 50') Assistive device: Rollator (4 wheels) Gait Pattern/deviations: Step-to pattern,  Decreased stride length, Trunk flexed Gait velocity: decreased     General Gait Details: Ambulated 40'x2 and then 50' using rollator.  Cues for rollator use and "parking" on wall for rest breaks.  Took 2 seated rest breaks.   Stairs             Wheelchair Mobility     Tilt Bed    Modified Rankin (Stroke Patients Only)       Balance Overall balance assessment: Needs assistance Sitting-balance support: No upper extremity supported Sitting balance-Leahy Scale: Good     Standing balance support: No upper extremity supported, Bilateral upper extremity supported Standing balance-Leahy Scale: Fair Standing balance comment: RW to ambulate but able to stand without AD                            Communication    Cognition Arousal: Alert Behavior During Therapy: WFL for tasks assessed/performed   PT - Cognitive impairments: Memory                       PT - Cognition Comments: Needs repeated cues for therapy plan and rollator use        Cueing    Exercises      General Comments General comments (skin integrity, edema, etc.): Pt on 5 L O2 with sats 93%.  Ambulated on 6 L (5 not option) - sats initially 92% but with last bout ambulation  88% taking 10 sec to recover to 90%. Able to return to 5 L.      Pertinent Vitals/Pain Pain Assessment Pain Assessment: No/denies pain    Home Living                          Prior Function            PT Goals (current goals can now be found in the care plan section) Progress towards PT goals: Progressing toward goals    Frequency    Min 2X/week      PT Plan      Co-evaluation              AM-PAC PT "6 Clicks" Mobility   Outcome Measure  Help needed turning from your back to your side while in a flat bed without using bedrails?: A Little Help needed moving from lying on your back to sitting on the side of a flat bed without using bedrails?: A Little Help needed moving to and  from a bed to a chair (including a wheelchair)?: A Little Help needed standing up from a chair using your arms (e.g., wheelchair or bedside chair)?: A Little Help needed to walk in hospital room?: A Little Help needed climbing 3-5 steps with a railing? : A Lot 6 Click Score: 17    End of Session Equipment Utilized During Treatment: Gait belt Activity Tolerance: Patient tolerated treatment well Patient left: in chair;with call bell/phone within reach Nurse Communication: Mobility status PT Visit Diagnosis: Other abnormalities of gait and mobility (R26.89);Muscle weakness (generalized) (M62.81)     Time: 1713-1740 PT Time Calculation (min) (ACUTE ONLY): 27 min  Charges:    $Gait Training: 8-22 mins $Therapeutic Activity: 8-22 mins PT General Charges $$ ACUTE PT VISIT: 1 Visit                     Anise Salvo, PT Acute Rehab [facility] [phone]    Rayetta Humphrey 09/03/2023, 5:54 PM

## 2023-09-03 NOTE — Consult Note (Signed)
 NAME:  Wayne Booth, MRN:  [id number], DOB:  January 07, 1941, LOS: 5 ADMISSION DATE:  08/29/2023, CONSULTATION DATE:  3/3 REFERRING MD:  Irene Limbo, CHIEF COMPLAINT:  hypoxic respiratory failure   History of Present Illness:  83 year old male with past medical hisotry of atrial fibrillation, CAD, HTN, OSA, chronic hypoxic respiratory failure, pulmonary fibrosis/ILD on 5LNC home O2 who presented to ED on 3/3 with shortness of breath. In ED, reporting increased work of breathing over the past 1 week. Presented tachycardic, afebrile, tachypneic. COVID/flu/RSV negative, BNP 38, troponin 4, WBC 9.1, CMP grossly normal. Blood cultures drawn. Initial lactic 8.1>4.9. He had CTA PE study negative for pulmonary embolism. Demonstrated known ILD with likely superimposed multifocal pneumonia. On 3/3 pulmonary consulted for assistance.   On my exam, older male, pleasant. He is objectively dyspneic but not in extremis. On 6LNC. He subjectively feels short of breath. Denies new or different cough, fever or chills, decreased appetite, N/V/D, sick contacts. He denies chest pain or palpitations. He states he wears his oxygen "most of the time." Does not wear a CPAP. Has not seen pulmonologist since 09/2022  Pertinent  Medical History  atrial fibrillation, CAD, HTN, OSA, chronic hypoxic respiratory failure, pulmonary fibrosis/ILD on 5LNC home O2  Significant Hospital Events: Including procedures, antibiotic start and stop dates in addition to other pertinent events   3/2: admit to trh for sob  3/3: pulm consult  Interim History / Subjective:   Back on his home oxygen support but does not feel completely better. Does not take any breathing treatments at home but does feel improvement while he's here.   Objective   Blood pressure 127/76, pulse 73, temperature 97.6 F (36.4 C), temperature source Oral, resp. rate 16, height 5\' 11"  (1.803 m), weight 106 kg, SpO2 98%.        Intake/Output Summary (Last 24 hours) at  09/03/2023 0900 Last data filed at 09/03/2023 0600 Gross per 24 hour  Intake 540 ml  Output 4600 ml  Net -4060 ml   Filed Weights   08/29/23 1354  Weight: 106 kg    Examination: Elderly man, laying flat in bed on nebulizer treatment No respiratory distress Expiratory wheeze best auscultated over the neck No rhonchi RRR   Labs: Sputum culture 3/5 normal oral flora Resp virus panel negative Mrsa negative   Resolved Hospital Problem list     Assessment & Plan:  Acute on chronic hypoxic respiratory failure  Pneumonia ILD/Pulmonary Fibrosis on 5LNC home O2 OSA not on CPAP  - Echo with normal EF, RV not well visualized - Continue brovana/pulmicort/yupelri while inpatient. Would discharge on these for home use with prn albuterol since he perceives benefit. Will need nebulizer machine at home for discharge.  - complete prednisone taper to start 3/7 (already ordered) - Continue ceftriaxone and azithromycin for 5 days total abx. - will set him up to see Dr. Vassie Loll in the office  Pulmonary will follow as needed. Please call if additional questions  Durel Salts, MD Pulmonary and Critical Care Medicine [facility] 09/03/2023 9:11 AM Pager: see AMION  If no response to pager, please call critical care on call (see AMION) until 7pm After 7:00 pm call Elink     Labs   CBC: Recent Labs  Lab 08/29/23 0326 08/30/23 0309 09/01/23 0843 09/03/23 0357  WBC 9.1 15.6* 12.1* 13.2*  HGB 15.7 13.2 15.4 14.7  HCT 49.2 41.8 49.9 45.6  MCV 94.6 95.4 96.9 94.2  PLT 340 277 323 305  Basic Metabolic Panel: Recent Labs  Lab 08/29/23 0326 08/29/23 1429 08/30/23 0309 09/01/23 0843 09/03/23 0357  NA 135 134* 137 139 136  K 3.7 4.6 4.7 4.3 4.3  CL 102 102 107 98 94*  CO2 26 18* 25 33* 34*  GLUCOSE 138* 209* 94 90 127*  BUN 17 15 18 18  26*  CREATININE 1.13 1.22 0.97 0.92 1.04  CALCIUM 8.7* 8.2* 8.2* 8.5* 8.1*   GFR: Estimated Creatinine Clearance: 69 mL/min (by C-G  formula based on SCr of 1.04 mg/dL). Recent Labs  Lab 08/29/23 0326 08/29/23 0920 08/29/23 1219 08/29/23 1429 08/29/23 1910 08/30/23 0309 08/30/23 1222 09/01/23 0843 09/03/23 0357  PROCALCITON  --   --   --   --   --   --  <0.10  --   --   WBC 9.1  --   --   --   --  15.6*  --  12.1* 13.2*  LATICACIDVEN  --  8.2* 8.1* 4.9* 1.8  --   --   --   --     Liver Function Tests: Recent Labs  Lab 08/29/23 0326  AST 30  ALT 22  ALKPHOS 68  BILITOT 1.1  PROT 8.6*  ALBUMIN 2.9*   No results for input(s): "LIPASE", "AMYLASE" in the last 168 hours. No results for input(s): "AMMONIA" in the last 168 hours.  ABG    Component Value Date/Time   PHART 7.353 07/14/2019 1031   PCO2ART 56.1 (H) 07/14/2019 1031   PO2ART 92.0 07/14/2019 1031   HCO3 31.2 (H) 07/14/2019 1031   TCO2 33 (H) 07/14/2019 1031   ACIDBASEDEF 4.0 (H) 02/13/2018 0337   O2SAT 96.0 07/14/2019 1031     Coagulation Profile: Recent Labs  Lab 08/30/23 0309  INR 1.2    Cardiac Enzymes: No results for input(s): "CKTOTAL", "CKMB", "CKMBINDEX", "TROPONINI" in the last 168 hours.  HbA1C: Hgb A1c MFr Bld  Date/Time Value Ref Range Status  04/13/2022 03:48 PM 5.7 4.6 - 6.5 % Final    Comment:    Glycemic Control Guidelines for People with Diabetes:Non Diabetic:  <6%Goal of Therapy: <7%Additional Action Suggested:  >8%   10/01/2021 10:56 AM 5.6 4.6 - 6.5 % Final    Comment:    Glycemic Control Guidelines for People with Diabetes:Non Diabetic:  <6%Goal of Therapy: <7%Additional Action Suggested:  >8%     CBG: Recent Labs  Lab 09/01/23 2243  GLUCAP 189*

## 2023-09-03 NOTE — Progress Notes (Signed)
   09/03/23 2200  BiPAP/CPAP/SIPAP  BiPAP/CPAP/SIPAP Pt Type Adult  Reason BIPAP/CPAP not in use Non-compliant (refuses)

## 2023-09-04 DIAGNOSIS — R6520 Severe sepsis without septic shock: Secondary | ICD-10-CM | POA: Diagnosis not present

## 2023-09-04 DIAGNOSIS — A419 Sepsis, unspecified organism: Secondary | ICD-10-CM | POA: Diagnosis not present

## 2023-09-04 LAB — BASIC METABOLIC PANEL
Anion gap: 9 (ref 5–15)
BUN: 27 mg/dL — ABNORMAL HIGH (ref 8–23)
CO2: 33 mmol/L — ABNORMAL HIGH (ref 22–32)
Calcium: 8.3 mg/dL — ABNORMAL LOW (ref 8.9–10.3)
Chloride: 93 mmol/L — ABNORMAL LOW (ref 98–111)
Creatinine, Ser: 0.87 mg/dL (ref 0.61–1.24)
GFR, Estimated: >60 mL/min (ref >60)
Glucose, Bld: 122 mg/dL — ABNORMAL HIGH (ref 70–99)
Potassium: 3.7 mmol/L (ref 3.5–5.1)
Sodium: 135 mmol/L (ref 135–145)

## 2023-09-04 MED ORDER — FUROSEMIDE 10 MG/ML IJ SOLN
40.0000 mg | Freq: Once | INTRAMUSCULAR | Status: AC
  Administered 2023-09-04: 40 mg via INTRAVENOUS
  Filled 2023-09-04: qty 4

## 2023-09-04 NOTE — Progress Notes (Signed)
   09/04/23 2224  BiPAP/CPAP/SIPAP  BiPAP/CPAP/SIPAP Pt Type Adult  Reason BIPAP/CPAP not in use Non-compliant (pt does not wear)

## 2023-09-04 NOTE — Plan of Care (Signed)
  Problem: Fluid Volume: Goal: Hemodynamic stability will improve Outcome: Progressing   Problem: Clinical Measurements: Goal: Signs and symptoms of infection will decrease Outcome: Progressing   Problem: Respiratory: Goal: Ability to maintain adequate ventilation will improve Outcome: Progressing   Problem: Education: Goal: Knowledge of General Education information will improve Description: Including pain rating scale, medication(s)/side effects and non-pharmacologic comfort measures Outcome: Progressing   Problem: Health Behavior/Discharge Planning: Goal: Ability to manage health-related needs will improve Outcome: Progressing   Problem: Clinical Measurements: Goal: Ability to maintain clinical measurements within normal limits will improve Outcome: Progressing Goal: Respiratory complications will improve Outcome: Progressing Goal: Cardiovascular complication will be avoided Outcome: Progressing   Problem: Activity: Goal: Risk for activity intolerance will decrease Outcome: Progressing   Problem: Nutrition: Goal: Adequate nutrition will be maintained Outcome: Progressing   Problem: Coping: Goal: Level of anxiety will decrease Outcome: Progressing   Problem: Elimination: Goal: Will not experience complications related to bowel motility Outcome: Progressing Goal: Will not experience complications related to urinary retention Outcome: Progressing

## 2023-09-04 NOTE — Plan of Care (Signed)

## 2023-09-04 NOTE — Progress Notes (Signed)
 PROGRESS NOTE  Wayne DOLLAR  ZOX:[id number] DOB: [birth year] DOA: 08/29/2023 PCP: Shelva Majestic, MD   Brief Narrative: Patient is a 83 year old male with history of COPD, ILD, chronic hypoxic respiratory failure on 5 L of oxygen per minute, OSA who presented with shortness of breath from home.  Chest imaging on presentation showed multifocal bilateral consolidative changes.  Patient was admitted for the management of acute proximal respiratory failure secondary to multifocal pneumonia.  Currently on 5 L of oxygen per minute which is his baseline.  PCCM were following.  Now clinically improving with IV diuresis.  Discharge planning tomorrow  Assessment & Plan:  Principal Problem:   Severe sepsis (HCC) Active Problems:   OSA (obstructive sleep apnea)   Chronic respiratory failure with hypoxia (HCC)   Aortic atherosclerosis (HCC)   ILD (interstitial lung disease) (HCC)   Nodule of right lung   Lobar pneumonia (HCC)   Severe sepsis secondary to multifocal pneumonia: Presented with worsening dyspnea.  Chest imaging showed multifocal bilateral consolidative changes.  Lactic acid was elevated to 8 on presentation.  Given IV fluid, started on antibiotics.  Blood cultures have not shown any growth.  Sputum culture showing mixed organisms likely normal respiratory flora: No staph or Pseudomonas.  Treated with ceftriaxone and azithromycin.  Completed antibiotics course. respiratory viral panel, COVID/flu/RSV negative.  Chronic hypoxic respiratory failure/history of COPD/ILD: On 5 L of oxygen at home.  Chest imaging as above.  CT angiogram did not show any PE. Also started on  steroid, with slow taper. Echo had shown EF of 65 to 70%.  Continue current bronchodilators.  He was dyspneic  likely from volume overload, he got fluids during this hospitalization.  Dyspnea is significantly better with diuresis.  Kidney function preserved.  Will give him another dose of IV Lasix 40 mg once  again today. He  will follow-up with Dr. Vassie Loll after discharge  Right middle lung nodule: 1.3 cm.  He needs to follow-up with pulmonology as an outpatient.  Depression/anxiety: On buspirone, Lexapro trazodone at home.  Debility/deconditioning:.  PT recommended HH.  Lives with his wife and ambulates well at home        DVT prophylaxis:enoxaparin (LOVENOX) injection 40 mg Start: 08/29/23 1000     Code Status: Full Code  Family Communication: Called and discussed with spouse Jody on 3/6  Patient status:Inpatient  Patient is from :home  Anticipated discharge WJ:XBJY  Estimated DC date:tomorrow   Consultants: PCCM  Procedures:None  Antimicrobials:  Anti-infectives (From admission, onward)    Start     Dose/Rate Route Frequency Ordered Stop   08/30/23 1000  cefTRIAXone (ROCEPHIN) 2 g in sodium chloride 0.9 % 100 mL IVPB        2 g 200 mL/hr over 30 Minutes Intravenous Daily 08/29/23 0937 09/02/23 1230   08/30/23 1000  azithromycin (ZITHROMAX) 500 mg in sodium chloride 0.9 % 250 mL IVPB        500 mg 250 mL/hr over 60 Minutes Intravenous Daily 08/29/23 0937 09/02/23 1043   08/29/23 0845  cefTRIAXone (ROCEPHIN) 2 g in sodium chloride 0.9 % 100 mL IVPB        2 g 200 mL/hr over 30 Minutes Intravenous  Once 08/29/23 0830 08/29/23 1007   08/29/23 0845  azithromycin (ZITHROMAX) 500 mg in sodium chloride 0.9 % 250 mL IVPB        500 mg 250 mL/hr over 60 Minutes Intravenous  Once 08/29/23 0830 08/29/23 0940  Subjective: Patient seen and examined at bedside today.  Lying in bed.  He feels so-so.  Not in any kind of distress.  On baseline oxygen requirement.  He says he may be able to go home tomorrow.  We again discussed about giving a dose of IV Lasix once today  Objective: Vitals:   09/04/23 0245 09/04/23 0434 09/04/23 0820 09/04/23 0824  BP:  101/60    Pulse:  73    Resp:  16    Temp:  (!) 97.5 F (36.4 C)    TempSrc:  Oral    SpO2: 96% 99% 97% 97%  Weight:      Height:         Intake/Output Summary (Last 24 hours) at 09/04/2023 1119 Last data filed at 09/04/2023 0830 Gross per 24 hour  Intake 1200 ml  Output 2025 ml  Net -825 ml   Filed Weights   08/29/23 1354  Weight: 106 kg    Examination:    General exam: Chronically looking, deconditioned, weak, obese HEENT: PERRL Respiratory system: Basal crackles Cardiovascular system: S1 & S2 heard, RRR.  Gastrointestinal system: Abdomen is nondistended, soft and nontender. Central nervous system: Alert and oriented Extremities: No edema, no clubbing ,no cyanosis Skin: No rashes, no ulcers,no icterus     Data Reviewed: I have personally reviewed following labs and imaging studies  CBC: Recent Labs  Lab 08/29/23 0326 08/30/23 0309 09/01/23 0843 09/03/23 0357  WBC 9.1 15.6* 12.1* 13.2*  HGB 15.7 13.2 15.4 14.7  HCT 49.2 41.8 49.9 45.6  MCV 94.6 95.4 96.9 94.2  PLT 340 277 323 305   Basic Metabolic Panel: Recent Labs  Lab 08/29/23 1429 08/30/23 0309 09/01/23 0843 09/03/23 0357 09/04/23 0403  NA 134* 137 139 136 135  K 4.6 4.7 4.3 4.3 3.7  CL 102 107 98 94* 93*  CO2 18* 25 33* 34* 33*  GLUCOSE 209* 94 90 127* 122*  BUN 15 18 18  26* 27*  CREATININE 1.22 0.97 0.92 1.04 0.87  CALCIUM 8.2* 8.2* 8.5* 8.1* 8.3*     Recent Results (from the past 240 hours)  Resp panel by RT-PCR (RSV, Flu A&B, Covid) Anterior Nasal Swab     Status: None   Collection Time: 08/29/23  3:26 AM   Specimen: Anterior Nasal Swab  Result Value Ref Range Status   SARS Coronavirus 2 by RT PCR NEGATIVE NEGATIVE Final    Comment: (NOTE) SARS-CoV-2 target nucleic acids are NOT DETECTED.  The SARS-CoV-2 RNA is generally detectable in upper respiratory specimens during the acute phase of infection. The lowest concentration of SARS-CoV-2 viral copies this assay can detect is 138 copies/mL. A negative result does not preclude SARS-Cov-2 infection and should not be used as the sole basis for treatment or other patient  management decisions. A negative result may occur with  improper specimen collection/handling, submission of specimen other than nasopharyngeal swab, presence of viral mutation(s) within the areas targeted by this assay, and inadequate number of viral copies(<138 copies/mL). A negative result must be combined with clinical observations, patient history, and epidemiological information. The expected result is Negative.  Fact Sheet for Patients:  BloggerCourse.com  Fact Sheet for Healthcare Providers:  SeriousBroker.it  This test is no t yet approved or cleared by the [place] FDA and  has been authorized for detection and/or diagnosis of SARS-CoV-2 by FDA under an Emergency Use Authorization (EUA). This EUA will remain  in effect (meaning this test can be used) for the duration of  the COVID-19 declaration under Section 564(b)(1) of the Act, 21 U.S.C.section 360bbb-3(b)(1), unless the authorization is terminated  or revoked sooner.       Influenza A by PCR NEGATIVE NEGATIVE Final   Influenza B by PCR NEGATIVE NEGATIVE Final    Comment: (NOTE) The Xpert Xpress SARS-CoV-2/FLU/RSV plus assay is intended as an aid in the diagnosis of influenza from Nasopharyngeal swab specimens and should not be used as a sole basis for treatment. Nasal washings and aspirates are unacceptable for Xpert Xpress SARS-CoV-2/FLU/RSV testing.  Fact Sheet for Patients: BloggerCourse.com  Fact Sheet for Healthcare Providers: SeriousBroker.it  This test is not yet approved or cleared by the [place] FDA and has been authorized for detection and/or diagnosis of SARS-CoV-2 by FDA under an Emergency Use Authorization (EUA). This EUA will remain in effect (meaning this test can be used) for the duration of the COVID-19 declaration under Section 564(b)(1) of the Act, 21 U.S.C. section 360bbb-3(b)(1),  unless the authorization is terminated or revoked.     Resp Syncytial Virus by PCR NEGATIVE NEGATIVE Final    Comment: (NOTE) Fact Sheet for Patients: BloggerCourse.com  Fact Sheet for Healthcare Providers: SeriousBroker.it  This test is not yet approved or cleared by the [place] FDA and has been authorized for detection and/or diagnosis of SARS-CoV-2 by FDA under an Emergency Use Authorization (EUA). This EUA will remain in effect (meaning this test can be used) for the duration of the COVID-19 declaration under Section 564(b)(1) of the Act, 21 U.S.C. section 360bbb-3(b)(1), unless the authorization is terminated or revoked.  Performed at [facility], [address], [place], [place] [address]   Culture, blood (routine x 2)     Status: None   Collection Time: 08/29/23  9:00 AM   Specimen: BLOOD  Result Value Ref Range Status   Specimen Description   Final    BLOOD LEFT ANTECUBITAL Performed at [facility], [address], [place], [place] [address]    Special Requests   Final    BOTTLES DRAWN AEROBIC AND ANAEROBIC Blood Culture results may not be optimal due to an inadequate volume of blood received in culture bottles Performed at [facility], [address], [place], [place] [address]    Culture   Final    NO GROWTH 5 DAYS Performed at [facility], [address], [place], [place] [address]    Report Status 09/03/2023 FINAL  Final  Culture, blood (routine x 2)     Status: None   Collection Time: 08/29/23  9:05 AM   Specimen: BLOOD  Result Value Ref Range Status   Specimen Description   Final    BLOOD LEFT ANTECUBITAL Performed at [facility], [address], [place], [place] [address]    Special Requests   Final    BOTTLES DRAWN AEROBIC AND ANAEROBIC Blood Culture results may not be optimal due to an inadequate volume of blood  received in culture bottles Performed at [facility], [address] [place], [place] [address]    Culture   Final    NO GROWTH 5 DAYS Performed at [facility], [address], [place], [place] [address]    Report Status 09/03/2023 FINAL  Final  Respiratory (~20 pathogens) panel by PCR     Status: None   Collection Time: 08/30/23 12:31 PM   Specimen: Nasopharyngeal Swab; Respiratory  Result Value Ref Range Status   Adenovirus NOT DETECTED  NOT DETECTED Final   Coronavirus 229E NOT DETECTED NOT DETECTED Final    Comment: (NOTE) The Coronavirus on the Respiratory Panel, DOES NOT test for the novel  Coronavirus (2019 nCoV)    Coronavirus HKU1 NOT DETECTED NOT DETECTED Final   Coronavirus NL63 NOT DETECTED NOT DETECTED Final   Coronavirus OC43 NOT DETECTED NOT DETECTED Final   Metapneumovirus NOT DETECTED NOT DETECTED Final   Rhinovirus / Enterovirus NOT DETECTED NOT DETECTED Final   Influenza A NOT DETECTED NOT DETECTED Final   Influenza B NOT DETECTED NOT DETECTED Final   Parainfluenza Virus 1 NOT DETECTED NOT DETECTED Final   Parainfluenza Virus 2 NOT DETECTED NOT DETECTED Final   Parainfluenza Virus 3 NOT DETECTED NOT DETECTED Final   Parainfluenza Virus 4 NOT DETECTED NOT DETECTED Final   Respiratory Syncytial Virus NOT DETECTED NOT DETECTED Final   Bordetella pertussis NOT DETECTED NOT DETECTED Final   Bordetella Parapertussis NOT DETECTED NOT DETECTED Final   Chlamydophila pneumoniae NOT DETECTED NOT DETECTED Final   Mycoplasma pneumoniae NOT DETECTED NOT DETECTED Final    Comment: Performed at [facility], [address] [place], [place] [address]  Expectorated Sputum Assessment w Gram Stain, Rflx to Resp Cult     Status: None   Collection Time: 08/30/23 12:31 PM   Specimen: Expectorated Sputum  Result Value Ref Range Status   Specimen Description EXPECTORATED SPUTUM  Final   Special Requests NONE  Final   Sputum evaluation   Final     THIS SPECIMEN IS ACCEPTABLE FOR SPUTUM CULTURE Performed at [facility], [address], [place], [place] [address]    Report Status 08/30/2023 FINAL  Final  Culture, Respiratory w Gram Stain     Status: None   Collection Time: 08/30/23 12:31 PM  Result Value Ref Range Status   Specimen Description   Final    EXPECTORATED SPUTUM Performed at [facility], [name] [place], [place] [address]    Special Requests   Final    NONE Reflexed from [id number] Performed at [facility], [address], [place], [place] [address]    Gram Stain   Final    MODERATE WBC PRESENT, PREDOMINANTLY PMN RARE GRAM NEGATIVE RODS RARE GRAM POSITIVE RODS RARE GRAM POSITIVE COCCI    Culture   Final    Normal respiratory flora-no Staph aureus or Pseudomonas seen Performed at [facility], [address], [place], [place] [address]    Report Status 09/01/2023 FINAL  Final  MRSA Next Gen by PCR, Nasal     Status: Abnormal   Collection Time: 08/31/23 11:51 AM  Result Value Ref Range Status   MRSA by PCR Next Gen DETECTED (A) NOT DETECTED Final    Comment: (NOTE) The GeneXpert MRSA Assay (FDA approved for NASAL specimens only), is one component of a comprehensive MRSA colonization surveillance program. It is not intended to diagnose MRSA infection nor to guide or monitor treatment for MRSA infections. Test performance is not FDA approved in patients less than 55 years old. Performed at [facility], [address], [place], [place] [address]      Radiology Studies: No results found.   Scheduled Meds:  arformoterol  15 mcg Nebulization BID   budesonide (PULMICORT) nebulizer solution  0.25 mg Nebulization BID   Chlorhexidine Gluconate Cloth  6 each Topical Daily   enoxaparin (LOVENOX) injection  40 mg Subcutaneous Daily   escitalopram  20 mg  Oral Daily   gabapentin  300 mg Oral TID   mupirocin ointment  1  Application Nasal BID   predniSONE  40 mg Oral Q breakfast   Followed by   Melene Muller ON 09/06/2023] predniSONE  30 mg Oral Q breakfast   Followed by   Melene Muller ON 09/09/2023] predniSONE  20 mg Oral Q breakfast   Followed by   Melene Muller ON 09/12/2023] predniSONE  10 mg Oral Q breakfast   Followed by   Melene Muller ON 09/15/2023] predniSONE  5 mg Oral Q breakfast   revefenacin  175 mcg Nebulization Daily   Continuous Infusions:     LOS: 6 days   Burnadette Pop, MD [facility] P3/01/2024, 11:19 AM

## 2023-09-04 NOTE — Progress Notes (Signed)
 Mobility Specialist - Progress Note   09/04/23 1215  Mobility  Activity Ambulated with assistance in hallway  Level of Assistance Standby assist, set-up cues, supervision of patient - no hands on  Assistive Device  (HHA)  Distance Ambulated (ft) 40 ft  Activity Response Tolerated well  Mobility Referral Yes  Mobility visit 1 Mobility  Mobility Specialist Start Time (ACUTE ONLY) 1200  Mobility Specialist Stop Time (ACUTE ONLY) 1215  Mobility Specialist Time Calculation (min) (ACUTE ONLY) 15 min   Pt received in recliner and agreeable to mobility. No complaints during session. Pt held onto hallway rails during session. Unable to obtain SpO2 reading during ambulation. Upon returning to room, pt SpO2 at 88%. Encouraged pursed lip breaths bringing SpO2 to 99%. RN notified. Pt to recliner after session with all needs met.    Pre-mobility: 95% SpO2 (6L [place]) Post-mobility: 88-99% SPO2 (5L)  Chief Technology Officer

## 2023-09-04 NOTE — Progress Notes (Signed)
 Working with patient education about oral fluid intake and salt control (patient really likes saltine crackers.) Educated about diuresing and improved breathing as opposed to increasing fluid intake and eating salty foods. Patient verbalized understanding.

## 2023-09-05 DIAGNOSIS — A419 Sepsis, unspecified organism: Secondary | ICD-10-CM | POA: Diagnosis not present

## 2023-09-05 DIAGNOSIS — R6520 Severe sepsis without septic shock: Secondary | ICD-10-CM | POA: Diagnosis not present

## 2023-09-05 LAB — BASIC METABOLIC PANEL
Anion gap: 6 (ref 5–15)
BUN: 28 mg/dL — ABNORMAL HIGH (ref 8–23)
CO2: 35 mmol/L — ABNORMAL HIGH (ref 22–32)
Calcium: 8.3 mg/dL — ABNORMAL LOW (ref 8.9–10.3)
Chloride: 94 mmol/L — ABNORMAL LOW (ref 98–111)
Creatinine, Ser: 0.99 mg/dL (ref 0.61–1.24)
GFR, Estimated: >60 mL/min (ref >60)
Glucose, Bld: 133 mg/dL — ABNORMAL HIGH (ref 70–99)
Potassium: 3.6 mmol/L (ref 3.5–5.1)
Sodium: 135 mmol/L (ref 135–145)

## 2023-09-05 MED ORDER — FUROSEMIDE 20 MG PO TABS
20.0000 mg | ORAL_TABLET | Freq: Every day | ORAL | Status: DC
  Administered 2023-09-05 – 2023-09-07 (×3): 20 mg via ORAL
  Filled 2023-09-05 (×3): qty 1

## 2023-09-05 NOTE — Progress Notes (Signed)
 PROGRESS NOTE  Wayne MAGNONE  ZOX:[id number] DOB: Jan 24, 1941 DOA: 08/29/2023 PCP: Shelva Majestic, MD   Brief Narrative: Patient is a 83 year old male with history of COPD, ILD, chronic hypoxic respiratory failure on 5 L of oxygen per minute, OSA who presented with shortness of breath from home.  Chest imaging on presentation showed multifocal bilateral consolidative changes.  Patient was admitted for the management of acute proximal respiratory failure secondary to multifocal pneumonia.  Currently on 5 L of oxygen per minute which is his baseline.  PCCM were following.  Clinically improved with IV diuresis.  Diuretics changed to oral.  PT consulted  Assessment & Plan:  Principal Problem:   Severe sepsis (HCC) Active Problems:   OSA (obstructive sleep apnea)   Chronic respiratory failure with hypoxia (HCC)   Aortic atherosclerosis (HCC)   ILD (interstitial lung disease) (HCC)   Nodule of right lung   Lobar pneumonia (HCC)   Severe sepsis secondary to multifocal pneumonia: Presented with worsening dyspnea.  Chest imaging showed multifocal bilateral consolidative changes.  Lactic acid was elevated to 8 on presentation.  Given IV fluid, started on antibiotics.  Blood cultures have not shown any growth.  Sputum culture showing mixed organisms likely normal respiratory flora: No staph or Pseudomonas.  Treated with ceftriaxone and azithromycin.  Completed antibiotics course. respiratory viral panel, COVID/flu/RSV negative.  Chronic hypoxic respiratory failure/history of COPD/ILD: On 5 L of oxygen at home.  Chest imaging as above.  CT angiogram did not show any PE. Also started on  steroid, with slow taper. Echo had shown EF of 65 to 70%.  Continue current bronchodilators.  He was dyspneic  likely from volume overload, he got fluids during this hospitalization.  Dyspnea is significantly better with diuresis.  Kidney function preserved.  We will continue Lasix 20 mg daily.   He will follow-up  with Dr. Vassie Loll after discharge  Right middle lung nodule: 1.3 cm.  He needs to follow-up with pulmonology as an outpatient.  Depression/anxiety: On buspirone, Lexapro trazodone at home.  Debility/deconditioning:.  PT recommended HH.  Lives with his wife and ambulates well at home.  Patient requested for PT evaluation.        DVT prophylaxis:enoxaparin (LOVENOX) injection 40 mg Start: 08/29/23 1000     Code Status: Full Code  Family Communication: Called and discussed with spouse Jody on 3/6  Patient status:Inpatient  Patient is from :home  Anticipated discharge WJ:XBJY  Estimated DC date:tomorrow, patient does not feel ready today.  Requested for PT evaluation   Consultants: PCCM  Procedures:None  Antimicrobials:  Anti-infectives (From admission, onward)    Start     Dose/Rate Route Frequency Ordered Stop   08/30/23 1000  cefTRIAXone (ROCEPHIN) 2 g in sodium chloride 0.9 % 100 mL IVPB        2 g 200 mL/hr over 30 Minutes Intravenous Daily 08/29/23 0937 09/02/23 1230   08/30/23 1000  azithromycin (ZITHROMAX) 500 mg in sodium chloride 0.9 % 250 mL IVPB        500 mg 250 mL/hr over 60 Minutes Intravenous Daily 08/29/23 0937 09/02/23 1043   08/29/23 0845  cefTRIAXone (ROCEPHIN) 2 g in sodium chloride 0.9 % 100 mL IVPB        2 g 200 mL/hr over 30 Minutes Intravenous  Once 08/29/23 0830 08/29/23 1007   08/29/23 0845  azithromycin (ZITHROMAX) 500 mg in sodium chloride 0.9 % 250 mL IVPB        500 mg 250 mL/hr over  60 Minutes Intravenous  Once 08/29/23 0830 08/29/23 0940       Subjective: Patient seen and examined at bedside today.  Hemodynamically stable.  Lying in bed.  On 5 L of oxygen per minute.  Overall comfortable.  He requests for PT evaluation today.  We discussed about possible discharge planning to home tomorrow  Objective: Vitals:   09/05/23 0453 09/05/23 0757 09/05/23 0800 09/05/23 1000  BP: 109/64   125/65  Pulse: 83   77  Resp: 20     Temp: 97.8 F  (36.6 C)     TempSrc: Oral     SpO2: 98% 97% 97% 98%  Weight:      Height:        Intake/Output Summary (Last 24 hours) at 09/05/2023 1142 Last data filed at 09/05/2023 0744 Gross per 24 hour  Intake 360 ml  Output 3000 ml  Net -2640 ml   Filed Weights   08/29/23 1354  Weight: 106 kg    Examination: General exam: Overall comfortable, not in distress, deconditioned, chronically ill looking HEENT: PERRL Respiratory system: Mild bibasilar crackles Cardiovascular system: S1 & S2 heard, RRR.  Gastrointestinal system: Abdomen is nondistended, soft and nontender. Central nervous system: Alert and oriented Extremities: No edema, no clubbing ,no cyanosis Skin: No rashes, no ulcers,no icterus     Data Reviewed: I have personally reviewed following labs and imaging studies  CBC: Recent Labs  Lab 08/30/23 0309 09/01/23 0843 09/03/23 0357  WBC 15.6* 12.1* 13.2*  HGB 13.2 15.4 14.7  HCT 41.8 49.9 45.6  MCV 95.4 96.9 94.2  PLT 277 323 305   Basic Metabolic Panel: Recent Labs  Lab 08/30/23 0309 09/01/23 0843 09/03/23 0357 09/04/23 0403 09/05/23 0435  NA 137 139 136 135 135  K 4.7 4.3 4.3 3.7 3.6  CL 107 98 94* 93* 94*  CO2 25 33* 34* 33* 35*  GLUCOSE 94 90 127* 122* 133*  BUN 18 18 26* 27* 28*  CREATININE 0.97 0.92 1.04 0.87 0.99  CALCIUM 8.2* 8.5* 8.1* 8.3* 8.3*     Recent Results (from the past 240 hours)  Resp panel by RT-PCR (RSV, Flu A&B, Covid) Anterior Nasal Swab     Status: None   Collection Time: 08/29/23  3:26 AM   Specimen: Anterior Nasal Swab  Result Value Ref Range Status   SARS Coronavirus 2 by RT PCR NEGATIVE NEGATIVE Final    Comment: (NOTE) SARS-CoV-2 target nucleic acids are NOT DETECTED.  The SARS-CoV-2 RNA is generally detectable in upper respiratory specimens during the acute phase of infection. The lowest concentration of SARS-CoV-2 viral copies this assay can detect is 138 copies/mL. A negative result does not preclude  SARS-Cov-2 infection and should not be used as the sole basis for treatment or other patient management decisions. A negative result may occur with  improper specimen collection/handling, submission of specimen other than nasopharyngeal swab, presence of viral mutation(s) within the areas targeted by this assay, and inadequate number of viral copies(<138 copies/mL). A negative result must be combined with clinical observations, patient history, and epidemiological information. The expected result is Negative.  Fact Sheet for Patients:  BloggerCourse.com  Fact Sheet for Healthcare Providers:  SeriousBroker.it  This test is no t yet approved or cleared by the [place] FDA and  has been authorized for detection and/or diagnosis of SARS-CoV-2 by FDA under an Emergency Use Authorization (EUA). This EUA will remain  in effect (meaning this test can be used) for the duration of  the COVID-19 declaration under Section 564(b)(1) of the Act, 21 U.S.C.section 360bbb-3(b)(1), unless the authorization is terminated  or revoked sooner.       Influenza A by PCR NEGATIVE NEGATIVE Final   Influenza B by PCR NEGATIVE NEGATIVE Final    Comment: (NOTE) The Xpert Xpress SARS-CoV-2/FLU/RSV plus assay is intended as an aid in the diagnosis of influenza from Nasopharyngeal swab specimens and should not be used as a sole basis for treatment. Nasal washings and aspirates are unacceptable for Xpert Xpress SARS-CoV-2/FLU/RSV testing.  Fact Sheet for Patients: BloggerCourse.com  Fact Sheet for Healthcare Providers: SeriousBroker.it  This test is not yet approved or cleared by the [place] FDA and has been authorized for detection and/or diagnosis of SARS-CoV-2 by FDA under an Emergency Use Authorization (EUA). This EUA will remain in effect (meaning this test can be used) for the duration of  the COVID-19 declaration under Section 564(b)(1) of the Act, 21 U.S.C. section 360bbb-3(b)(1), unless the authorization is terminated or revoked.     Resp Syncytial Virus by PCR NEGATIVE NEGATIVE Final    Comment: (NOTE) Fact Sheet for Patients: BloggerCourse.com  Fact Sheet for Healthcare Providers: SeriousBroker.it  This test is not yet approved or cleared by the [place] FDA and has been authorized for detection and/or diagnosis of SARS-CoV-2 by FDA under an Emergency Use Authorization (EUA). This EUA will remain in effect (meaning this test can be used) for the duration of the COVID-19 declaration under Section 564(b)(1) of the Act, 21 U.S.C. section 360bbb-3(b)(1), unless the authorization is terminated or revoked.  Performed at [facility], [address], [place], [place] [address]   Culture, blood (routine x 2)     Status: None   Collection Time: 08/29/23  9:00 AM   Specimen: BLOOD  Result Value Ref Range Status   Specimen Description   Final    BLOOD LEFT ANTECUBITAL Performed at [facility], [address], [place], [place] [address]    Special Requests   Final    BOTTLES DRAWN AEROBIC AND ANAEROBIC Blood Culture results may not be optimal due to an inadequate volume of blood received in culture bottles Performed at [facility], [address] [place], [place] [address]    Culture   Final    NO GROWTH 5 DAYS Performed at [facility], [address], [place], [place] [address]    Report Status 09/03/2023 FINAL  Final  Culture, blood (routine x 2)     Status: None   Collection Time: 08/29/23  9:05 AM   Specimen: BLOOD  Result Value Ref Range Status   Specimen Description   Final    BLOOD LEFT ANTECUBITAL Performed at [facility], [address], [place], [place] [address]    Special Requests   Final    BOTTLES DRAWN  AEROBIC AND ANAEROBIC Blood Culture results may not be optimal due to an inadequate volume of blood received in culture bottles Performed at [facility], [address] [place], [place] [address]    Culture   Final    NO GROWTH 5 DAYS Performed at [facility], [address] [place], [place] [address]    Report Status 09/03/2023 FINAL  Final  Respiratory (~20 pathogens) panel by PCR     Status: None   Collection Time: 08/30/23 12:31 PM   Specimen: Nasopharyngeal Swab; Respiratory  Result Value Ref Range Status   Adenovirus NOT DETECTED  NOT DETECTED Final   Coronavirus 229E NOT DETECTED NOT DETECTED Final    Comment: (NOTE) The Coronavirus on the Respiratory Panel, DOES NOT test for the novel  Coronavirus (2019 nCoV)    Coronavirus HKU1 NOT DETECTED NOT DETECTED Final   Coronavirus NL63 NOT DETECTED NOT DETECTED Final   Coronavirus OC43 NOT DETECTED NOT DETECTED Final   Metapneumovirus NOT DETECTED NOT DETECTED Final   Rhinovirus / Enterovirus NOT DETECTED NOT DETECTED Final   Influenza A NOT DETECTED NOT DETECTED Final   Influenza B NOT DETECTED NOT DETECTED Final   Parainfluenza Virus 1 NOT DETECTED NOT DETECTED Final   Parainfluenza Virus 2 NOT DETECTED NOT DETECTED Final   Parainfluenza Virus 3 NOT DETECTED NOT DETECTED Final   Parainfluenza Virus 4 NOT DETECTED NOT DETECTED Final   Respiratory Syncytial Virus NOT DETECTED NOT DETECTED Final   Bordetella pertussis NOT DETECTED NOT DETECTED Final   Bordetella Parapertussis NOT DETECTED NOT DETECTED Final   Chlamydophila pneumoniae NOT DETECTED NOT DETECTED Final   Mycoplasma pneumoniae NOT DETECTED NOT DETECTED Final    Comment: Performed at [facility], [address] [place], [place] [address]  Expectorated Sputum Assessment w Gram Stain, Rflx to Resp Cult     Status: None   Collection Time: 08/30/23 12:31 PM   Specimen: Expectorated Sputum  Result Value Ref Range Status   Specimen  Description EXPECTORATED SPUTUM  Final   Special Requests NONE  Final   Sputum evaluation   Final    THIS SPECIMEN IS ACCEPTABLE FOR SPUTUM CULTURE Performed at [facility], [address], [place], [place] [address]    Report Status 08/30/2023 FINAL  Final  Culture, Respiratory w Gram Stain     Status: None   Collection Time: 08/30/23 12:31 PM  Result Value Ref Range Status   Specimen Description   Final    EXPECTORATED SPUTUM Performed at [facility], [address] [place], [place] [address]    Special Requests   Final    NONE Reflexed from [id number] Performed at [facility], [address], [place], [place] [address]    Gram Stain   Final    MODERATE WBC PRESENT, PREDOMINANTLY PMN RARE GRAM NEGATIVE RODS RARE GRAM POSITIVE RODS RARE GRAM POSITIVE COCCI    Culture   Final    Normal respiratory flora-no Staph aureus or Pseudomonas seen Performed at [facility], [address], [place], [place] [address]    Report Status 09/01/2023 FINAL  Final  MRSA Next Gen by PCR, Nasal     Status: Abnormal   Collection Time: 08/31/23 11:51 AM  Result Value Ref Range Status   MRSA by PCR Next Gen DETECTED (A) NOT DETECTED Final    Comment: (NOTE) The GeneXpert MRSA Assay (FDA approved for NASAL specimens only), is one component of a comprehensive MRSA colonization surveillance program. It is not intended to diagnose MRSA infection nor to guide or monitor treatment for MRSA infections. Test performance is not FDA approved in patients less than 50 years old. Performed at [facility], [address], [place], [place] [address]      Radiology Studies: No results found.   Scheduled Meds:  arformoterol  15 mcg Nebulization BID   budesonide (PULMICORT) nebulizer solution  0.25 mg Nebulization BID   Chlorhexidine Gluconate Cloth  6 each Topical Daily   enoxaparin (LOVENOX) injection  40 mg Subcutaneous  Daily   escitalopram  20  mg Oral Daily   furosemide  20 mg Oral Daily   gabapentin  300 mg Oral TID   mupirocin ointment  1 Application Nasal BID   [START ON 09/06/2023] predniSONE  30 mg Oral Q breakfast   Followed by   Melene Muller ON 09/09/2023] predniSONE  20 mg Oral Q breakfast   Followed by   Melene Muller ON 09/12/2023] predniSONE  10 mg Oral Q breakfast   Followed by   Melene Muller ON 09/15/2023] predniSONE  5 mg Oral Q breakfast   revefenacin  175 mcg Nebulization Daily   Continuous Infusions:     LOS: 7 days   Burnadette Pop, MD [facility] P3/02/2024, 11:42 AM

## 2023-09-06 DIAGNOSIS — R6520 Severe sepsis without septic shock: Secondary | ICD-10-CM | POA: Diagnosis not present

## 2023-09-06 DIAGNOSIS — A419 Sepsis, unspecified organism: Secondary | ICD-10-CM | POA: Diagnosis not present

## 2023-09-06 NOTE — Progress Notes (Addendum)
 RN ambulated patient 150 feet in the hallway on 6 liters of oxygen. Patient ambulated only with the oxygen tank, but with contact guard assist/steadying assist. Patient tolerated activity fair. Once patient was back in the room, patient was winded and had difficulty catching his breath. RN stayed with patient for at least 5 minutes and instructed and encouraged patient to take slow, deep breaths. Patient was finally able to catch his breath and RN placed patient back on 5 liters of oxygen.  Earlier at 1505, patient had another episode where patient was winded and had difficulty catching his breath. RN did the same thing as mentioned before. RN stayed with patient and RN encouraged/instructed patient to take slow, deep breaths. Patient was finally able to catch his breath and RN placed patient back on 5 liters of oxygen.  RN will pass along to night shift RN to ambulate patient.

## 2023-09-06 NOTE — Progress Notes (Signed)
 RT discussed CPAP use w/pt. Pt declined use at this time.     09/06/23 2111  BiPAP/CPAP/SIPAP  BiPAP/CPAP/SIPAP Pt Type Adult  Reason BIPAP/CPAP not in use Non-compliant  FiO2 (%) 40 %  BiPAP/CPAP /SiPAP Vitals  SpO2 95 %  Bilateral Breath Sounds Clear;Diminished

## 2023-09-06 NOTE — Progress Notes (Signed)
 PROGRESS NOTE  Wayne Booth  AOZ:[id number] DOB: 02/12/1941 DOA: 08/29/2023 PCP: Shelva Majestic, MD   Brief Narrative: Patient is a 83 year old male with history of COPD, ILD, chronic hypoxic respiratory failure on 5 L of oxygen per minute, OSA who presented with shortness of breath from home.  Chest imaging on presentation showed multifocal bilateral consolidative changes.  Patient was admitted for the management of acute proximal respiratory failure secondary to multifocal pneumonia.  Currently on 5 L of oxygen per minute which is his baseline.  PCCM were following.  Clinically improved with IV diuresis.  Diuretics changed to oral.  PT consulted, recommending home with home health but patient says he feels tired and not ready to go home yet  Assessment & Plan:  Principal Problem:   Severe sepsis (HCC) Active Problems:   OSA (obstructive sleep apnea)   Chronic respiratory failure with hypoxia (HCC)   Aortic atherosclerosis (HCC)   ILD (interstitial lung disease) (HCC)   Nodule of right lung   Lobar pneumonia (HCC)   Severe sepsis secondary to multifocal pneumonia: Presented with worsening dyspnea.  Chest imaging showed multifocal bilateral consolidative changes.  Lactic acid was elevated to 8 on presentation.  Given IV fluid, started on antibiotics.  Blood cultures have not shown any growth.  Sputum culture showing mixed organisms likely normal respiratory flora: No staph or Pseudomonas.  Treated with ceftriaxone and azithromycin.  Completed antibiotics course. respiratory viral panel, COVID/flu/RSV negative.  Chronic hypoxic respiratory failure/history of COPD/ILD: On 5 L of oxygen at home.  Chest imaging as above.  CT angiogram did not show any PE. Also started on  steroid, with slow taper. Echo had shown EF of 65 to 70%.  Continue current bronchodilators.  He was dyspneic  likely from volume overload, he got fluids during this hospitalization.  Dyspnea is significantly better with  diuresis.  Kidney function preserved.  We will continue Lasix 20 mg daily.   He will follow-up with Dr. Vassie Loll after discharge  Right middle lung nodule: 1.3 cm.  He needs to follow-up with pulmonology as an outpatient.  Depression/anxiety: On buspirone, Lexapro trazodone at home.  Debility/deconditioning:.  PT recommended HH.  Lives with his wife and ambulates well at home.  Patient requested for PT evaluation today.        DVT prophylaxis:enoxaparin (LOVENOX) injection 40 mg Start: 08/29/23 1000     Code Status: Full Code  Family Communication: Called and discussed with spouse Jody on 3/10  Patient status:Inpatient  Patient is from :home  Anticipated discharge NG:EXBM  Estimated DC date:tomorrow    Consultants: PCCM  Procedures:None  Antimicrobials:  Anti-infectives (From admission, onward)    Start     Dose/Rate Route Frequency Ordered Stop   08/30/23 1000  cefTRIAXone (ROCEPHIN) 2 g in sodium chloride 0.9 % 100 mL IVPB        2 g 200 mL/hr over 30 Minutes Intravenous Daily 08/29/23 0937 09/02/23 1230   08/30/23 1000  azithromycin (ZITHROMAX) 500 mg in sodium chloride 0.9 % 250 mL IVPB        500 mg 250 mL/hr over 60 Minutes Intravenous Daily 08/29/23 0937 09/02/23 1043   08/29/23 0845  cefTRIAXone (ROCEPHIN) 2 g in sodium chloride 0.9 % 100 mL IVPB        2 g 200 mL/hr over 30 Minutes Intravenous  Once 08/29/23 0830 08/29/23 1007   08/29/23 0845  azithromycin (ZITHROMAX) 500 mg in sodium chloride 0.9 % 250 mL IVPB  500 mg 250 mL/hr over 60 Minutes Intravenous  Once 08/29/23 0830 08/29/23 0940       Subjective: Patient seen and examined at bedside today.  During my  evaluation, he was lying on bed.  On 5 L of oxygen, which is his baseline.  He says he feels slightly better today.  He was waiting for PT evaluation.  Objective: Vitals:   09/05/23 2036 09/05/23 2036 09/06/23 0456 09/06/23 0853  BP:  130/71 (!) 130/96   Pulse:  85 68   Resp:  19 18    Temp:  98.5 F (36.9 C) 97.8 F (36.6 C)   TempSrc:  Oral Oral   SpO2: 94% 95% 99% 95%  Weight:      Height:        Intake/Output Summary (Last 24 hours) at 09/06/2023 1253 Last data filed at 09/06/2023 0459 Gross per 24 hour  Intake 720 ml  Output 3300 ml  Net -2580 ml   Filed Weights   08/29/23 1354  Weight: 106 kg    Examination:  General exam: Very deconditioned, but overall comfortable, not in distress, chronically looking HEENT: PERRL Respiratory system: Bilateral crackles Cardiovascular system: S1 & S2 heard, RRR.  Gastrointestinal system: Abdomen is nondistended, soft and nontender. Central nervous system: Alert and oriented Extremities: No edema, no clubbing ,no cyanosis Skin: No rashes, no ulcers,no icterus       Data Reviewed: I have personally reviewed following labs and imaging studies  CBC: Recent Labs  Lab 09/01/23 0843 09/03/23 0357  WBC 12.1* 13.2*  HGB 15.4 14.7  HCT 49.9 45.6  MCV 96.9 94.2  PLT 323 305   Basic Metabolic Panel: Recent Labs  Lab 09/01/23 0843 09/03/23 0357 09/04/23 0403 09/05/23 0435  NA 139 136 135 135  K 4.3 4.3 3.7 3.6  CL 98 94* 93* 94*  CO2 33* 34* 33* 35*  GLUCOSE 90 127* 122* 133*  BUN 18 26* 27* 28*  CREATININE 0.92 1.04 0.87 0.99  CALCIUM 8.5* 8.1* 8.3* 8.3*     Recent Results (from the past 240 hours)  Resp panel by RT-PCR (RSV, Flu A&B, Covid) Anterior Nasal Swab     Status: None   Collection Time: 08/29/23  3:26 AM   Specimen: Anterior Nasal Swab  Result Value Ref Range Status   SARS Coronavirus 2 by RT PCR NEGATIVE NEGATIVE Final    Comment: (NOTE) SARS-CoV-2 target nucleic acids are NOT DETECTED.  The SARS-CoV-2 RNA is generally detectable in upper respiratory specimens during the acute phase of infection. The lowest concentration of SARS-CoV-2 viral copies this assay can detect is 138 copies/mL. A negative result does not preclude SARS-Cov-2 infection and should not be used as the sole basis  for treatment or other patient management decisions. A negative result may occur with  improper specimen collection/handling, submission of specimen other than nasopharyngeal swab, presence of viral mutation(s) within the areas targeted by this assay, and inadequate number of viral copies(<138 copies/mL). A negative result must be combined with clinical observations, patient history, and epidemiological information. The expected result is Negative.  Fact Sheet for Patients:  BloggerCourse.com  Fact Sheet for Healthcare Providers:  SeriousBroker.it  This test is no t yet approved or cleared by the [place] FDA and  has been authorized for detection and/or diagnosis of SARS-CoV-2 by FDA under an Emergency Use Authorization (EUA). This EUA will remain  in effect (meaning this test can be used) for the duration of the COVID-19 declaration under Section  564(b)(1) of the Act, 21 U.S.C.section 360bbb-3(b)(1), unless the authorization is terminated  or revoked sooner.       Influenza A by PCR NEGATIVE NEGATIVE Final   Influenza B by PCR NEGATIVE NEGATIVE Final    Comment: (NOTE) The Xpert Xpress SARS-CoV-2/FLU/RSV plus assay is intended as an aid in the diagnosis of influenza from Nasopharyngeal swab specimens and should not be used as a sole basis for treatment. Nasal washings and aspirates are unacceptable for Xpert Xpress SARS-CoV-2/FLU/RSV testing.  Fact Sheet for Patients: BloggerCourse.com  Fact Sheet for Healthcare Providers: SeriousBroker.it  This test is not yet approved or cleared by the [place] FDA and has been authorized for detection and/or diagnosis of SARS-CoV-2 by FDA under an Emergency Use Authorization (EUA). This EUA will remain in effect (meaning this test can be used) for the duration of the COVID-19 declaration under Section 564(b)(1) of the Act, 21  U.S.C. section 360bbb-3(b)(1), unless the authorization is terminated or revoked.     Resp Syncytial Virus by PCR NEGATIVE NEGATIVE Final    Comment: (NOTE) Fact Sheet for Patients: BloggerCourse.com  Fact Sheet for Healthcare Providers: SeriousBroker.it  This test is not yet approved or cleared by the [place] FDA and has been authorized for detection and/or diagnosis of SARS-CoV-2 by FDA under an Emergency Use Authorization (EUA). This EUA will remain in effect (meaning this test can be used) for the duration of the COVID-19 declaration under Section 564(b)(1) of the Act, 21 U.S.C. section 360bbb-3(b)(1), unless the authorization is terminated or revoked.  Performed at [facility], [address], [place], [place] [address]   Culture, blood (routine x 2)     Status: None   Collection Time: 08/29/23  9:00 AM   Specimen: BLOOD  Result Value Ref Range Status   Specimen Description   Final    BLOOD LEFT ANTECUBITAL Performed at [facility], [address] [place], [place] [address]    Special Requests   Final    BOTTLES DRAWN AEROBIC AND ANAEROBIC Blood Culture results may not be optimal due to an inadequate volume of blood received in culture bottles Performed at [facility], [address] [place], [place] [address]    Culture   Final    NO GROWTH 5 DAYS Performed at [facility], [address], [place], [place] [address]    Report Status 09/03/2023 FINAL  Final  Culture, blood (routine x 2)     Status: None   Collection Time: 08/29/23  9:05 AM   Specimen: BLOOD  Result Value Ref Range Status   Specimen Description   Final    BLOOD LEFT ANTECUBITAL Performed at [facility], [address], [place], [place] [address]    Special Requests   Final    BOTTLES DRAWN AEROBIC AND ANAEROBIC Blood Culture results may not be optimal due  to an inadequate volume of blood received in culture bottles Performed at [facility], [address] [place], [place] [address]    Culture   Final    NO GROWTH 5 DAYS Performed at [facility], [address], [place], [place] [address]    Report Status 09/03/2023 FINAL  Final  Respiratory (~20 pathogens) panel by PCR     Status: None   Collection Time: 08/30/23 12:31 PM   Specimen: Nasopharyngeal Swab; Respiratory  Result Value Ref Range Status   Adenovirus NOT DETECTED NOT DETECTED Final  Coronavirus 229E NOT DETECTED NOT DETECTED Final    Comment: (NOTE) The Coronavirus on the Respiratory Panel, DOES NOT test for the novel  Coronavirus (2019 nCoV)    Coronavirus HKU1 NOT DETECTED NOT DETECTED Final   Coronavirus NL63 NOT DETECTED NOT DETECTED Final   Coronavirus OC43 NOT DETECTED NOT DETECTED Final   Metapneumovirus NOT DETECTED NOT DETECTED Final   Rhinovirus / Enterovirus NOT DETECTED NOT DETECTED Final   Influenza A NOT DETECTED NOT DETECTED Final   Influenza B NOT DETECTED NOT DETECTED Final   Parainfluenza Virus 1 NOT DETECTED NOT DETECTED Final   Parainfluenza Virus 2 NOT DETECTED NOT DETECTED Final   Parainfluenza Virus 3 NOT DETECTED NOT DETECTED Final   Parainfluenza Virus 4 NOT DETECTED NOT DETECTED Final   Respiratory Syncytial Virus NOT DETECTED NOT DETECTED Final   Bordetella pertussis NOT DETECTED NOT DETECTED Final   Bordetella Parapertussis NOT DETECTED NOT DETECTED Final   Chlamydophila pneumoniae NOT DETECTED NOT DETECTED Final   Mycoplasma pneumoniae NOT DETECTED NOT DETECTED Final    Comment: Performed at [facility], [address], [place], [place] [address]  Expectorated Sputum Assessment w Gram Stain, Rflx to Resp Cult     Status: None   Collection Time: 08/30/23 12:31 PM   Specimen: Expectorated Sputum  Result Value Ref Range Status   Specimen Description EXPECTORATED SPUTUM  Final   Special Requests NONE  Final    Sputum evaluation   Final    THIS SPECIMEN IS ACCEPTABLE FOR SPUTUM CULTURE Performed at [facility], [address], [place], [place] [address]    Report Status 08/30/2023 FINAL  Final  Culture, Respiratory w Gram Stain     Status: None   Collection Time: 08/30/23 12:31 PM  Result Value Ref Range Status   Specimen Description   Final    EXPECTORATED SPUTUM Performed at [facility], [address], [place], [place] [address]    Special Requests   Final    NONE Reflexed from [id number] Performed at [facility], [address], [place], [place] [address]    Gram Stain   Final    MODERATE WBC PRESENT, PREDOMINANTLY PMN RARE GRAM NEGATIVE RODS RARE GRAM POSITIVE RODS RARE GRAM POSITIVE COCCI    Culture   Final    Normal respiratory flora-no Staph aureus or Pseudomonas seen Performed at [facility], [address] [place], [place] [address]    Report Status 09/01/2023 FINAL  Final  MRSA Next Gen by PCR, Nasal     Status: Abnormal   Collection Time: 08/31/23 11:51 AM  Result Value Ref Range Status   MRSA by PCR Next Gen DETECTED (A) NOT DETECTED Final    Comment: (NOTE) The GeneXpert MRSA Assay (FDA approved for NASAL specimens only), is one component of a comprehensive MRSA colonization surveillance program. It is not intended to diagnose MRSA infection nor to guide or monitor treatment for MRSA infections. Test performance is not FDA approved in patients less than 63 years old. Performed at [facility], [address], [place], [place] [address]      Radiology Studies: No results found.   Scheduled Meds:  arformoterol  15 mcg Nebulization BID   budesonide (PULMICORT) nebulizer solution  0.25 mg Nebulization BID   enoxaparin (LOVENOX) injection  40 mg Subcutaneous Daily   escitalopram  20 mg Oral Daily   furosemide  20 mg Oral Daily   gabapentin  300  mg Oral TID   mupirocin ointment   1 Application Nasal BID   predniSONE  30 mg Oral Q breakfast   Followed by   Melene Muller ON 09/09/2023] predniSONE  20 mg Oral Q breakfast   Followed by   Melene Muller ON 09/12/2023] predniSONE  10 mg Oral Q breakfast   Followed by   Melene Muller ON 09/15/2023] predniSONE  5 mg Oral Q breakfast   revefenacin  175 mcg Nebulization Daily   Continuous Infusions:     LOS: 8 days   Burnadette Pop, MD [facility] P3/03/2024, 12:53 PM

## 2023-09-06 NOTE — Progress Notes (Signed)
 Physical Therapy Treatment Patient Details Name: Wayne Booth MRN: [id number] DOB: 07-May-1941 Today's Date: 09/06/2023   History of Present Illness Pt is 83 yo male admitted on 08/29/23 with severe sepsis secondary to multifocal PNE.  Pt with hx including but not limited to COPD, ILD, chronic resp failure on 5 L o2, OSA, CAD, arthritis, afib, nephrectomy, ACDF, craniotomy, bil TKA    PT Comments  Pt making gradual progress.  He ambulated multiple bouts and participated in multiple transfers with rest breaks.  Pt needing cues for rollator use and CGA for ambulation.  Pt on 6 L to ambulate with sats 91%, 5 L at rest.  Pt does fatigue easily.  He expressed that he is not ready to return home - still feels weak and SOB compared to baseline but also not really interested in SNF.  Stated "I want PT here."  Discussed we will cont therapy while hospitalized but not able to see everyday.  Also encouraged ambulation with nursing and mobility - pt agrees.  Nursing aware and mobilizing pt.  Notified MD of pt's concern.  Continue to recommend HHPT and rollator as pt supervision/CGA level.     If plan is discharge home, recommend the following: A little help with walking and/or transfers;A little help with bathing/dressing/bathroom;Assistance with cooking/housework;Help with stairs or ramp for entrance   Can travel by private vehicle        Equipment Recommendations  Rollator (4 wheels)    Recommendations for Other Services       Precautions / Restrictions Precautions Precautions: Fall     Mobility  Bed Mobility               General bed mobility comments: in chair    Transfers Overall transfer level: Needs assistance Equipment used: Rolling walker (2 wheels) Transfers: Sit to/from Stand Sit to Stand: Supervision           General transfer comment: Close supervision from recliner x 1, toielt x 1; CGA from rollator x3    Ambulation/Gait Ambulation/Gait assistance: Contact guard  assist Gait Distance (Feet): 80 Feet (10', 60', 80', 40') Assistive device: Rollator (4 wheels) Gait Pattern/deviations: Step-to pattern, Decreased stride length, Trunk flexed Gait velocity: decreased     General Gait Details: Cues for rollator use and "parking" on wall for rest breaks.  Took 3 seated rest breaks.   Stairs             Wheelchair Mobility     Tilt Bed    Modified Rankin (Stroke Patients Only)       Balance Overall balance assessment: Needs assistance Sitting-balance support: No upper extremity supported Sitting balance-Leahy Scale: Good     Standing balance support: No upper extremity supported, Bilateral upper extremity supported Standing balance-Leahy Scale: Fair Standing balance comment: RW to ambulate but able to stand without AD                            Communication    Cognition Arousal: Alert Behavior During Therapy: WFL for tasks assessed/performed   PT - Cognitive impairments: Memory                       PT - Cognition Comments: Needs cues for rollator        Cueing    Exercises      General Comments General comments (skin integrity, edema, etc.): Pt on 5 L O2 with sats 96%.  Ambulated on 6 L - sats 91%. Back to 5 L end of session      Pertinent Vitals/Pain Pain Assessment Pain Assessment: No/denies pain    Home Living                          Prior Function            PT Goals (current goals can now be found in the care plan section) Progress towards PT goals: Progressing toward goals    Frequency    Min 2X/week      PT Plan      Co-evaluation              AM-PAC PT "6 Clicks" Mobility   Outcome Measure  Help needed turning from your back to your side while in a flat bed without using bedrails?: A Little Help needed moving from lying on your back to sitting on the side of a flat bed without using bedrails?: A Little Help needed moving to and from a bed to a chair  (including a wheelchair)?: A Little Help needed standing up from a chair using your arms (e.g., wheelchair or bedside chair)?: A Little Help needed to walk in hospital room?: A Little Help needed climbing 3-5 steps with a railing? : A Lot 6 Click Score: 17    End of Session Equipment Utilized During Treatment: Gait belt;Oxygen Activity Tolerance: Patient tolerated treatment well Patient left: in chair;with call bell/phone within reach Nurse Communication: Mobility status PT Visit Diagnosis: Other abnormalities of gait and mobility (R26.89);Muscle weakness (generalized) (M62.81)     Time: 7829-5621 PT Time Calculation (min) (ACUTE ONLY): 26 min  Charges:    $Gait Training: 8-22 mins PT General Charges $$ ACUTE PT VISIT: 1 Visit                     Anise Salvo, PT Acute Rehab Services [facility] [phone]    Rayetta Humphrey 09/06/2023, 11:33 AM

## 2023-09-06 NOTE — Progress Notes (Signed)
 Pt on 6L of oxygen and ambulate with oxygen tank 75 feet in the hallway. Pt tolerated fair during walk. After pt back to room sit on the chair, had difficulty catching breathing, O2 sat is 93% on 5L oxygen. RN instructed and encourage pt to take deep breath.

## 2023-09-07 ENCOUNTER — Other Ambulatory Visit (HOSPITAL_COMMUNITY): Payer: Self-pay

## 2023-09-07 DIAGNOSIS — A419 Sepsis, unspecified organism: Secondary | ICD-10-CM | POA: Diagnosis not present

## 2023-09-07 DIAGNOSIS — R6520 Severe sepsis without septic shock: Secondary | ICD-10-CM | POA: Diagnosis not present

## 2023-09-07 LAB — BASIC METABOLIC PANEL
Anion gap: 9 (ref 5–15)
BUN: 26 mg/dL — ABNORMAL HIGH (ref 8–23)
CO2: 30 mmol/L (ref 22–32)
Calcium: 8.3 mg/dL — ABNORMAL LOW (ref 8.9–10.3)
Chloride: 97 mmol/L — ABNORMAL LOW (ref 98–111)
Creatinine, Ser: 1 mg/dL (ref 0.61–1.24)
GFR, Estimated: >60 mL/min (ref >60)
Glucose, Bld: 101 mg/dL — ABNORMAL HIGH (ref 70–99)
Potassium: 3.8 mmol/L (ref 3.5–5.1)
Sodium: 136 mmol/L (ref 135–145)

## 2023-09-07 MED ORDER — FUROSEMIDE 20 MG PO TABS
20.0000 mg | ORAL_TABLET | Freq: Every day | ORAL | 0 refills | Status: DC
  Filled 2023-09-07: qty 60, 60d supply, fill #0

## 2023-09-07 MED ORDER — PREDNISONE 5 MG PO TABS
ORAL_TABLET | ORAL | 0 refills | Status: DC
  Filled 2023-09-07: qty 27, 10d supply, fill #0

## 2023-09-07 MED ORDER — IPRATROPIUM-ALBUTEROL 0.5-2.5 (3) MG/3ML IN SOLN
3.0000 mL | Freq: Four times a day (QID) | RESPIRATORY_TRACT | 1 refills | Status: AC | PRN
  Filled 2023-09-07: qty 270, 23d supply, fill #0

## 2023-09-07 MED ORDER — BUDESONIDE 0.25 MG/2ML IN SUSP
0.2500 mg | Freq: Two times a day (BID) | RESPIRATORY_TRACT | 1 refills | Status: AC
  Filled 2023-09-07: qty 60, 15d supply, fill #0

## 2023-09-07 MED ORDER — REVEFENACIN 175 MCG/3ML IN SOLN
175.0000 ug | Freq: Every day | RESPIRATORY_TRACT | 0 refills | Status: DC
  Filled 2023-09-07: qty 90, 30d supply, fill #0

## 2023-09-07 MED ORDER — ARFORMOTEROL TARTRATE 15 MCG/2ML IN NEBU
15.0000 ug | INHALATION_SOLUTION | Freq: Two times a day (BID) | RESPIRATORY_TRACT | 1 refills | Status: DC
  Filled 2023-09-07: qty 120, 30d supply, fill #0

## 2023-09-07 NOTE — TOC Transition Note (Signed)
 Transition of Care [facility]) - Discharge Note   Patient Details  Name: Wayne Booth MRN: [id number] Date of Birth: 1941/04/10  Transition of Care [facility]) CM/SW Contact:  Larrie Kass, LCSW Phone Number: 09/07/2023, 12:25 PM   Clinical Narrative:     PT has recommended a rollator. CSW sent a referral to [employer], the same company the pt receives his O2 and nebulizer from. DME will be delivered to the pt's room prior to discharge.  Final next level of care: Home/Self Care Barriers to Discharge: No Barriers Identified   Patient Goals and CMS Choice Patient states their goals for this hospitalization and ongoing recovery are:: return home CMS Medicare.gov Compare Post Acute Care list provided to:: Patient Choice offered to / list presented to : Patient, Spouse      Discharge Placement                       Discharge Plan and Services Additional resources added to the After Visit Summary for                                       Social Drivers of Health (SDOH) Interventions SDOH Screenings   Food Insecurity: No Food Insecurity (08/29/2023)  Housing: Low Risk  (08/29/2023)  Transportation Needs: No Transportation Needs (08/29/2023)  Utilities: Not At Risk (08/29/2023)  Depression (PHQ2-9): High Risk (02/11/2023)  Financial Resource Strain: Low Risk  (01/04/2023)  Physical Activity: Inactive (01/04/2023)  Social Connections: Moderately Integrated (08/29/2023)  Stress: No Stress Concern Present (01/04/2023)  Tobacco Use: Low Risk  (08/29/2023)     Readmission Risk Interventions     No data to display

## 2023-09-07 NOTE — Progress Notes (Signed)
 TOC meds delivered in  a secure bag to pt  in room.

## 2023-09-07 NOTE — Care Management Important Message (Signed)
 Important Message  Patient Details IM Letter given. Name: Wayne Booth MRN: [id number] Date of Birth: 19-Oct-1940   Important Message Given:  Yes - Medicare IM     [name] 09/07/2023, 1:13 PM

## 2023-09-07 NOTE — Plan of Care (Signed)
  Problem: Fluid Volume: Goal: Hemodynamic stability will improve Outcome: Progressing   Problem: Clinical Measurements: Goal: Signs and symptoms of infection will decrease Outcome: Progressing   Problem: Respiratory: Goal: Ability to maintain adequate ventilation will improve Outcome: Progressing   Problem: Education: Goal: Knowledge of General Education information will improve Description: Including pain rating scale, medication(s)/side effects and non-pharmacologic comfort measures Outcome: Progressing   Problem: Health Behavior/Discharge Planning: Goal: Ability to manage health-related needs will improve Outcome: Progressing   Problem: Clinical Measurements: Goal: Will remain free from infection Outcome: Progressing

## 2023-09-07 NOTE — Progress Notes (Signed)
 PT Cancellation Note  Patient Details Name: Wayne Booth MRN: [id number] DOB: [birth year]   Cancelled Treatment:    Reason Eval/Treat Not Completed: Other (comment) Pt with d/c orders.  Reports would like to safe his energy for d/c home.  He does still want rollator-provided education on locking brakes for transfers.  Wife present.  Notified TOC pt would like rollator.  Anise Salvo, PT Acute Rehab [facility] [phone]   Rayetta Humphrey 09/07/2023, 11:56 AM

## 2023-09-07 NOTE — Discharge Summary (Addendum)
 Physician Discharge Summary  JESAIAH FABIANO ZOX:[id number] DOB: [birth year] DOA: 08/29/2023  PCP: Shelva Majestic, MD  Admit date: 08/29/2023 Discharge date: 09/07/2023  Admitted From: Home Disposition:  Home  Discharge Condition:Stable CODE STATUS:FULL, DNR, Comfort Care Diet recommendation: Heart Healthy / Carb Modified / Regular / Dysphagia   Brief/Interim Summary: Patient is a 83 year old male with history of COPD, ILD, chronic hypoxic respiratory failure on 5 L of oxygen per minute, OSA who presented with shortness of breath from home.  Chest imaging on presentation showed multifocal bilateral consolidative changes.  Patient was admitted for the management of acute proximal respiratory failure secondary to multifocal pneumonia.  Currently on 5 L of oxygen per minute which is his baseline.  PCCM were following.  Clinically improved with IV diuresis.  Diuretics changed to oral.  PT consulted, recommending home with home health .  Patient had prolonged hospital course.  PCCM recommended outpatient follow-up.  Medically stable for discharge today.  Following problems were addressed during the hospitalization:  Severe sepsis secondary to multifocal pneumonia: Presented with worsening dyspnea.  Chest imaging showed multifocal bilateral consolidative changes.  Lactic acid was elevated to 8 on presentation.  Given IV fluid, started on antibiotics.  Blood cultures have not shown any growth.  Sputum culture showing mixed organisms likely normal respiratory flora: No staph or Pseudomonas.  Treated with ceftriaxone and azithromycin.  Completed antibiotics course. respiratory viral panel, COVID/flu/RSV negative.   Chronic hypoxic respiratory failure/history of COPD/ILD: On 5 L of oxygen at home.  Chest imaging as above.  CT angiogram did not show any PE. Also started on  steroid, with slow taper. Echo had shown EF of 65 to 70%.  Continue current bronchodilators.  He was dyspneic  likely from volume  overload, he got fluids during this hospitalization.  Dyspnea is significantly better with diuresis.  Kidney function preserved.  We will continue Lasix 20 mg daily.   He will follow-up with Dr. Vassie Loll after discharge   Right middle lung nodule: 1.3 cm.  He needs to follow-up with pulmonology as an outpatient.   Depression/anxiety: On buspirone, Lexapro trazodone at home.   Debility/deconditioning:.  PT recommended HH.  Lives with his wife and ambulates well at home.  Discharge Diagnoses:  Principal Problem:   Severe sepsis (HCC) Active Problems:   OSA (obstructive sleep apnea)   Chronic respiratory failure with hypoxia (HCC)   Aortic atherosclerosis (HCC)   ILD (interstitial lung disease) (HCC)   Nodule of right lung   Lobar pneumonia [facility])    Discharge Instructions  Discharge Instructions     Diet - low sodium heart healthy   Complete by: As directed    Discharge instructions   Complete by: As directed    1)Please take your medications as instructed 2)Follow up with your PCP in a week 3)Follow up with your pulmonologist in 2 weeks.  You will be called for appointment   Increase activity slowly   Complete by: As directed       Allergies as of 09/07/2023       Reactions   Ambien [zolpidem] Anxiety   Anxiety and agitation when tried as sleeper in hospital aug 2019        Medication List     TAKE these medications    albuterol (2.5 MG/3ML) 0.083% nebulizer solution Commonly known as: PROVENTIL Take 3 mLs (2.5 mg total) by nebulization every 6 (six) hours as needed for wheezing or shortness of breath. What changed: Another medication with the  same name was changed. Make sure you understand how and when to take each.   albuterol 108 (90 Base) MCG/ACT inhaler Commonly known as: VENTOLIN HFA INHALE 2 PUFFS BY MOUTH EVERY 6 HOURS AS NEEDED FOR WHEEZING OR SHORTNESS OF BREATH What changed: See the new instructions.   budesonide 0.25 MG/2ML nebulizer  solution Commonly known as: PULMICORT Take 2 mLs (0.25 mg total) by nebulization 2 (two) times daily.   busPIRone 5 MG tablet Commonly known as: BUSPAR TAKE ONE TABLET BY MOUTH TWICE A DAY AS NEEDED FOR ANXIETY What changed: See the new instructions.   escitalopram 20 MG tablet Commonly known as: LEXAPRO TAKE 1 TABLET BY MOUTH DAILY   furosemide 20 MG tablet Commonly known as: LASIX Take 1 tablet (20 mg total) by mouth daily. Start taking on: September 08, 2023   gabapentin 300 MG capsule Commonly known as: NEURONTIN Take 1 capsule (300 mg total) by mouth 3 (three) times daily.   ipratropium-albuterol 0.5-2.5 (3) MG/3ML Soln Commonly known as: DUONEB Take 3 mLs by nebulization every 6 (six) hours as needed.   LORazepam 0.5 MG tablet Commonly known as: ATIVAN Take 1 tablet (0.5 mg total) by mouth daily as needed for anxiety (do not take within 8 hours of bed. do not drive for 8 hours after taking.).   predniSONE 5 MG tablet Commonly known as: DELTASONE Take 6 tablets (30 mg total) by mouth daily with breakfast for 1 day, THEN 4 tablets (20 mg total) daily with breakfast for 3 days, THEN 2 tablets (10 mg total) daily with breakfast for 3 days, THEN 1 tablet (5 mg total) daily with breakfast for 3 days. Start taking on: September 08, 2023   traMADol 50 MG tablet Commonly known as: ULTRAM Take 1 tablet (50 mg total) by mouth every 6 (six) hours as needed (prefer max 3 per day).   traZODone 50 MG tablet Commonly known as: DESYREL Take 0.5-1 tablets (25-50 mg total) by mouth at bedtime as needed for sleep.               Durable Medical Equipment  (From admission, onward)           Start     Ordered   09/07/23 1233  For home use only DME Walker rolling  Once       Question Answer Comment  Walker: With 5 Inch Wheels   Patient needs a walker to treat with the following condition Balance disorder      09/07/23 1232   09/07/23 1114  For home use only DME Nebulizer  machine  Once       Question Answer Comment  Patient needs a nebulizer to treat with the following condition COPD exacerbation (HCC)   Length of Need Lifetime   Additional equipment included Administration kit   Additional equipment included Filter      09/07/23 1113            Follow-up Information     Shelva Majestic, MD. Schedule an appointment as soon as possible for a visit in 1 week(s).   Specialty: Family Medicine Contact information: [address] [place] [address] [phone]                Allergies  Allergen Reactions   Ambien [Zolpidem] Anxiety    Anxiety and agitation when tried as sleeper in hospital aug 2019    Consultations: PCCM   Procedures/Studies: ECHOCARDIOGRAM COMPLETE Result Date: 08/31/2023    ECHOCARDIOGRAM REPORT  Patient Name:   MELECIO CUETO Breon Date of Exam: 08/31/2023 Medical Rec #:  [id number]       Height:       71.0 in Accession #:    [id number]      Weight:       233.7 lb Date of Birth:  09/30/1940      BSA:          2.253 m Patient Age:    82 years        BP:           126/77 mmHg Patient Gender: M               HR:           87 bpm. Exam Location:  Inpatient Procedure: 2D Echo, Cardiac Doppler, Color Doppler and Intracardiac            Opacification Agent (Both Spectral and Color Flow Doppler were            utilized during procedure). Indications:    Dyspnea  History:        Patient has prior history of Echocardiogram examinations, most                 recent 02/04/2018. CHF, Arrythmias:Atrial Flutter; Risk                 Factors:Hypertension.  Sonographer:    Amy Chionchio Referring Phys: [id number] Cristopher Peru IMPRESSIONS  1. Left ventricular ejection fraction, by estimation, is 65 to 70%. The left ventricle has normal function. The left ventricle has no regional wall motion abnormalities. Left ventricular diastolic function could not be evaluated.  2. Right ventricular systolic function was not well visualized. The right  ventricular size is not well visualized.  3. The mitral valve is degenerative. Trivial mitral valve regurgitation. Moderate to severe mitral annular calcification.  4. The aortic valve is tricuspid. Aortic valve regurgitation is not visualized. Mild aortic valve stenosis. Aortic valve area, by VTI measures 1.14 cm. Aortic valve mean gradient measures 16.0 mmHg. Aortic valve Vmax measures 2.74 m/s. FINDINGS  Left Ventricle: Left ventricular ejection fraction, by estimation, is 65 to 70%. The left ventricle has normal function. The left ventricle has no regional wall motion abnormalities. The left ventricular internal cavity size was normal in size. There is  no left ventricular hypertrophy. Left ventricular diastolic function could not be evaluated due to mitral annular calcification (moderate or greater). Left ventricular diastolic function could not be evaluated. Right Ventricle: The right ventricular size is not well visualized. Right vetricular wall thickness was not well visualized. Right ventricular systolic function was not well visualized. Left Atrium: Left atrial size was not well visualized. Right Atrium: Right atrial size was not well visualized. Pericardium: There is no evidence of pericardial effusion. Presence of epicardial fat layer. Mitral Valve: The mitral valve is degenerative in appearance. Moderate to severe mitral annular calcification. Trivial mitral valve regurgitation. MV peak gradient, 5.4 mmHg. The mean mitral valve gradient is 3.0 mmHg. Tricuspid Valve: The tricuspid valve is grossly normal. Tricuspid valve regurgitation is mild . No evidence of tricuspid stenosis. Aortic Valve: The aortic valve is tricuspid. Aortic valve regurgitation is not visualized. Mild aortic stenosis is present. Aortic valve mean gradient measures 16.0 mmHg. Aortic valve peak gradient measures 30.0 mmHg. Aortic valve area, by VTI measures 1.14 cm. Pulmonic Valve: The pulmonic valve was grossly normal. Pulmonic  valve regurgitation is not visualized. No evidence of pulmonic stenosis. Aorta:  The aortic root and ascending aorta are structurally normal, with no evidence of dilitation. Venous: The inferior vena cava was not well visualized. IAS/Shunts: The interatrial septum was not well visualized.  LEFT VENTRICLE PLAX 2D LVIDd:         4.30 cm     Diastology LVIDs:         3.60 cm     LV e' medial:    7.51 cm/s LV PW:         1.10 cm     LV E/e' medial:  13.8 LV IVS:        1.10 cm     LV e' lateral:   5.66 cm/s LVOT diam:     2.10 cm     LV E/e' lateral: 18.4 LV SV:         59 LV SV Index:   26 LVOT Area:     3.46 cm  LV Volumes (MOD) LV vol d, MOD A2C: 83.7 ml LV vol d, MOD A4C: 91.2 ml LV vol s, MOD A2C: 28.7 ml LV vol s, MOD A4C: 25.4 ml LV SV MOD A2C:     55.0 ml LV SV MOD A4C:     91.2 ml LV SV MOD BP:      61.6 ml RIGHT VENTRICLE TAPSE (M-mode): 1.3 cm AORTIC VALVE                     PULMONIC VALVE AV Area (Vmax):    1.12 cm      PV Vmax:       1.35 m/s AV Area (Vmean):   1.05 cm      PV Peak grad:  7.3 mmHg AV Area (VTI):     1.14 cm AV Vmax:           274.00 cm/s AV Vmean:          194.000 cm/s AV VTI:            0.512 m AV Peak Grad:      30.0 mmHg AV Mean Grad:      16.0 mmHg LVOT Vmax:         88.30 cm/s LVOT Vmean:        58.900 cm/s LVOT VTI:          0.169 m LVOT/AV VTI ratio: 0.33  AORTA Ao Root diam: 3.20 cm Ao Asc diam:  3.30 cm MITRAL VALVE                TRICUSPID VALVE MV Area (PHT): 4.10 cm     TR Peak grad:   37.0 mmHg MV Area VTI:   2.17 cm     TR Vmax:        304.00 cm/s MV Peak grad:  5.4 mmHg MV Mean grad:  3.0 mmHg     SHUNTS MV Vmax:       1.16 m/s     Systemic VTI:  0.17 m MV Vmean:      79.0 cm/s    Systemic Diam: 2.10 cm MV Decel Time: 185 msec MV E velocity: 104.00 cm/s MV A velocity: 82.00 cm/s MV E/A ratio:  1.27 Lennie Odor MD Electronically signed by Lennie Odor MD Signature Date/Time: 08/31/2023/11:00:56 AM    Final    CT Angio Chest PE W and/or Wo Contrast Result Date:  08/29/2023 CLINICAL DATA:  Evaluate for pulmonary embolism. Shortness of breath and COPD. EXAM: CT ANGIOGRAPHY CHEST WITH CONTRAST TECHNIQUE: Multidetector CT imaging  of the chest was performed using the standard protocol during bolus administration of intravenous contrast. Multiplanar CT image reconstructions and MIPs were obtained to evaluate the vascular anatomy. RADIATION DOSE REDUCTION: This exam was performed according to the departmental dose-optimization program which includes automated exposure control, adjustment of the mA and/or kV according to patient size and/or use of iterative reconstruction technique. CONTRAST:  OMNIPAQUE IOHEXOL 350 MG/ML SOLN COMPARISON:  CT 09/16/2022 FINDINGS: Cardiovascular: Satisfactory opacification of the pulmonary arteries to the segmental level. No evidence of pulmonary embolism. Mild cardiac enlargement. Aortic atherosclerosis. Coronary artery calcifications. Mediastinum/Nodes: No enlarged mediastinal, hilar, or axillary lymph nodes. Thyroid gland, trachea, and esophagus demonstrate no significant findings. Lungs/Pleura: Small left pleural effusion. Chronic fibrotic interstitial lung disease is again noted. Multifocal, bilateral consolidative change identified. This is most severe within the posterior basal right upper lobe and left lower lobe as well as the lingula. Unchanged chronic consolidative density with calcification involving the inferior lingula. No pneumothorax identified. There is a nodule within the right midlung measuring 1.3 x 1.1 cm, image 67/5. On the previous exam this measured 1.1 by 1.0 cm. None Upper Abdomen: No acute abnormality within the imaged portions of the upper abdomen. Cholecystectomy. 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 Musculoskeletal: Spondylosis within the thoracic spine. No acute or suspicious findings. Review of the MIP images confirms the above findings. IMPRESSION: 1. No evidence for  acute pulmonary embolism. 2. Chronic fibrotic interstitial lung disease. 3. Multifocal, bilateral consolidative change identified. This is most severe within the posterior basal right upper lobe and left lower lobe. Findings are favored to represent multifocal pneumonia. Follow-up imaging is advised to ensure resolution and to exclude underlying malignancy. 4. Right midlung nodule has mildly increased in the interval. Currently 1.3 cm. Recommend referral to pulmonary medicine or thoracic oncology for further management. 5. Small left pleural effusion. 6. Coronary artery calcifications. 7.  Aortic Atherosclerosis (ICD10-I70.0). Electronically Signed   By: Signa Kell M.D.   On: 08/29/2023 08:12   DG Chest Port 1 View Result Date: 08/29/2023 CLINICAL DATA:  Shortness of breath EXAM: PORTABLE CHEST 1 VIEW COMPARISON:  07/17/2022 FINDINGS: Cardiac shadow is enlarged but stable. Patchy airspace opacities are again identified as well as small pleural effusions. No new focal infiltrate is seen. No bony abnormality is noted. IMPRESSION: Stable appearance of the chest when compared with the prior exam. Electronically Signed   By: Alcide Clever M.D.   On: 08/29/2023 03:53      Subjective: Patient seen and examined at bedside today.  Sitting on the chair.  On baseline oxygen requirement on 5 L.  He feels better while at rest but patient was short of breath on ambulation.  Long discussion held at the bedside about this.  Dyspnea on exertion is most likely underlying  interstitial lung disease.  We discussed about follow-up with pulmonology as an outpatient.  He is medically ready for discharge.  He is medically ready for discharge.  I had called and discussed about discharge planning with wife Jody on 3/10  Discharge Exam: Vitals:   09/07/23 0455 09/07/23 0942  BP: 118/70   Pulse: 86   Resp: (!) 22   Temp: 97.7 F (36.5 C)   SpO2: 96% 98%   Vitals:   09/06/23 2106 09/06/23 2111 09/07/23 0455 09/07/23 0942   BP:   118/70   Pulse: 85  86   Resp:   (!) 22   Temp:   97.7  F (36.5 C)   TempSrc:   Oral   SpO2: 94% 95% 96% 98%  Weight:      Height:        General: Pt is alert, awake, not in acute distress, deconditioned, weak Cardiovascular: RRR, S1/S2 +, no rubs, no gallops Respiratory: Basal crackles bilaterally Abdominal: Soft, NT, ND, bowel sounds + Extremities: no edema, no cyanosis    The results of significant diagnostics from this hospitalization (including imaging, microbiology, ancillary and laboratory) are listed below for reference.     Microbiology: Recent Results (from the past 240 hours)  Resp panel by RT-PCR (RSV, Flu A&B, Covid) Anterior Nasal Swab     Status: None   Collection Time: 08/29/23  3:26 AM   Specimen: Anterior Nasal Swab  Result Value Ref Range Status   SARS Coronavirus 2 by RT PCR NEGATIVE NEGATIVE Final    Comment: (NOTE) SARS-CoV-2 target nucleic acids are NOT DETECTED.  The SARS-CoV-2 RNA is generally detectable in upper respiratory specimens during the acute phase of infection. The lowest concentration of SARS-CoV-2 viral copies this assay can detect is 138 copies/mL. A negative result does not preclude SARS-Cov-2 infection and should not be used as the sole basis for treatment or other patient management decisions. A negative result may occur with  improper specimen collection/handling, submission of specimen other than nasopharyngeal swab, presence of viral mutation(s) within the areas targeted by this assay, and inadequate number of viral copies(<138 copies/mL). A negative result must be combined with clinical observations, patient history, and epidemiological information. The expected result is Negative.  Fact Sheet for Patients:  BloggerCourse.com  Fact Sheet for Healthcare Providers:  SeriousBroker.it  This test is no t yet approved or cleared by the [place] FDA and  has been  authorized for detection and/or diagnosis of SARS-CoV-2 by FDA under an Emergency Use Authorization (EUA). This EUA will remain  in effect (meaning this test can be used) for the duration of the COVID-19 declaration under Section 564(b)(1) of the Act, 21 U.S.C.section 360bbb-3(b)(1), unless the authorization is terminated  or revoked sooner.       Influenza A by PCR NEGATIVE NEGATIVE Final   Influenza B by PCR NEGATIVE NEGATIVE Final    Comment: (NOTE) The Xpert Xpress SARS-CoV-2/FLU/RSV plus assay is intended as an aid in the diagnosis of influenza from Nasopharyngeal swab specimens and should not be used as a sole basis for treatment. Nasal washings and aspirates are unacceptable for Xpert Xpress SARS-CoV-2/FLU/RSV testing.  Fact Sheet for Patients: BloggerCourse.com  Fact Sheet for Healthcare Providers: SeriousBroker.it  This test is not yet approved or cleared by the [place] FDA and has been authorized for detection and/or diagnosis of SARS-CoV-2 by FDA under an Emergency Use Authorization (EUA). This EUA will remain in effect (meaning this test can be used) for the duration of the COVID-19 declaration under Section 564(b)(1) of the Act, 21 U.S.C. section 360bbb-3(b)(1), unless the authorization is terminated or revoked.     Resp Syncytial Virus by PCR NEGATIVE NEGATIVE Final    Comment: (NOTE) Fact Sheet for Patients: BloggerCourse.com  Fact Sheet for Healthcare Providers: SeriousBroker.it  This test is not yet approved or cleared by the [place] FDA and has been authorized for detection and/or diagnosis of SARS-CoV-2 by FDA under an Emergency Use Authorization (EUA). This EUA will remain in effect (meaning this test can be used) for the duration of the COVID-19 declaration under Section 564(b)(1) of the Act, 21 U.S.C. section 360bbb-3(b)(1), unless the  authorization is terminated or revoked.  Performed at [facility], [address] [place], [place] [address]   Culture, blood (routine x 2)     Status: None   Collection Time: 08/29/23  9:00 AM   Specimen: BLOOD  Result Value Ref Range Status   Specimen Description   Final    BLOOD LEFT ANTECUBITAL Performed at [facility], [address] [place], [place] [address]    Special Requests   Final    BOTTLES DRAWN AEROBIC AND ANAEROBIC Blood Culture results may not be optimal due to an inadequate volume of blood received in culture bottles Performed at [facility], [address] [place], [place] [address]    Culture   Final    NO GROWTH 5 DAYS Performed at [facility], [address] [place], [place] [address]    Report Status 09/03/2023 FINAL  Final  Culture, blood (routine x 2)     Status: None   Collection Time: 08/29/23  9:05 AM   Specimen: BLOOD  Result Value Ref Range Status   Specimen Description   Final    BLOOD LEFT ANTECUBITAL Performed at [facility], [address], [place], [place] [address]    Special Requests   Final    BOTTLES DRAWN AEROBIC AND ANAEROBIC Blood Culture results may not be optimal due to an inadequate volume of blood received in culture bottles Performed at [facility], [address] [place], [place] [address]    Culture   Final    NO GROWTH 5 DAYS Performed at [facility], [address] [place], [place] [address]    Report Status 09/03/2023 FINAL  Final  Respiratory (~20 pathogens) panel by PCR     Status: None   Collection Time: 08/30/23 12:31 PM   Specimen: Nasopharyngeal Swab; Respiratory  Result Value Ref Range Status   Adenovirus NOT DETECTED NOT DETECTED Final   Coronavirus 229E NOT DETECTED NOT DETECTED Final    Comment: (NOTE) The Coronavirus on the Respiratory Panel, DOES NOT test for the novel  Coronavirus (2019  nCoV)    Coronavirus HKU1 NOT DETECTED NOT DETECTED Final   Coronavirus NL63 NOT DETECTED NOT DETECTED Final   Coronavirus OC43 NOT DETECTED NOT DETECTED Final   Metapneumovirus NOT DETECTED NOT DETECTED Final   Rhinovirus / Enterovirus NOT DETECTED NOT DETECTED Final   Influenza A NOT DETECTED NOT DETECTED Final   Influenza B NOT DETECTED NOT DETECTED Final   Parainfluenza Virus 1 NOT DETECTED NOT DETECTED Final   Parainfluenza Virus 2 NOT DETECTED NOT DETECTED Final   Parainfluenza Virus 3 NOT DETECTED NOT DETECTED Final   Parainfluenza Virus 4 NOT DETECTED NOT DETECTED Final   Respiratory Syncytial Virus NOT DETECTED NOT DETECTED Final   Bordetella pertussis NOT DETECTED NOT DETECTED Final   Bordetella Parapertussis NOT DETECTED NOT DETECTED Final   Chlamydophila pneumoniae NOT DETECTED NOT DETECTED Final   Mycoplasma pneumoniae NOT DETECTED NOT DETECTED Final    Comment: Performed at [facility], [address] [place], [place] [address]  Expectorated Sputum Assessment w Gram Stain, Rflx to Resp Cult     Status: None   Collection Time: 08/30/23 12:31 PM   Specimen: Expectorated Sputum  Result Value Ref Range Status   Specimen Description EXPECTORATED SPUTUM  Final   Special Requests NONE  Final   Sputum evaluation   Final    THIS SPECIMEN IS ACCEPTABLE  FOR SPUTUM CULTURE Performed at [facility], [address] [place], [place] [address]    Report Status 08/30/2023 FINAL  Final  Culture, Respiratory w Gram Stain     Status: None   Collection Time: 08/30/23 12:31 PM  Result Value Ref Range Status   Specimen Description   Final    EXPECTORATED SPUTUM Performed at [facility], [address] [place], [place] [address]    Special Requests   Final    NONE Reflexed from [id number] Performed at [facility], [address] [place], [place] [address]    Gram Stain   Final    MODERATE WBC PRESENT, PREDOMINANTLY  PMN RARE GRAM NEGATIVE RODS RARE GRAM POSITIVE RODS RARE GRAM POSITIVE COCCI    Culture   Final    Normal respiratory flora-no Staph aureus or Pseudomonas seen Performed at [facility], [address] [place], [place] [address]    Report Status 09/01/2023 FINAL  Final  MRSA Next Gen by PCR, Nasal     Status: Abnormal   Collection Time: 08/31/23 11:51 AM  Result Value Ref Range Status   MRSA by PCR Next Gen DETECTED (A) NOT DETECTED Final    Comment: (NOTE) The GeneXpert MRSA Assay (FDA approved for NASAL specimens only), is one component of a comprehensive MRSA colonization surveillance program. It is not intended to diagnose MRSA infection nor to guide or monitor treatment for MRSA infections. Test performance is not FDA approved in patients less than 27 years old. Performed at [facility], [address] [address] [place], [place] [address]      Labs: BNP (last 3 results) Recent Labs    08/29/23 0326  BNP 38.3   Basic Metabolic Panel: Recent Labs  Lab 09/01/23 0843 09/03/23 0357 09/04/23 0403 09/05/23 0435 09/07/23 0408  NA 139 136 135 135 136  K 4.3 4.3 3.7 3.6 3.8  CL 98 94* 93* 94* 97*  CO2 33* 34* 33* 35* 30  GLUCOSE 90 127* 122* 133* 101*  BUN 18 26* 27* 28* 26*  CREATININE 0.92 1.04 0.87 0.99 1.00  CALCIUM 8.5* 8.1* 8.3* 8.3* 8.3*   Liver Function Tests: No results for input(s): "AST", "ALT", "ALKPHOS", "BILITOT", "PROT", "ALBUMIN" in the last 168 hours. No results for input(s): "LIPASE", "AMYLASE" in the last 168 hours. No results for input(s): "AMMONIA" in the last 168 hours. CBC: Recent Labs  Lab 09/01/23 0843 09/03/23 0357  WBC 12.1* 13.2*  HGB 15.4 14.7  HCT 49.9 45.6  MCV 96.9 94.2  PLT 323 305   Cardiac Enzymes: No results for input(s): "CKTOTAL", "CKMB", "CKMBINDEX", "TROPONINI" in the last 168 hours. BNP: Invalid input(s): "POCBNP" CBG: Recent Labs  Lab 09/01/23 2243  GLUCAP 189*   D-Dimer No results for  input(s): "DDIMER" in the last 72 hours. Hgb A1c No results for input(s): "HGBA1C" in the last 72 hours. Lipid Profile No results for input(s): "CHOL", "HDL", "LDLCALC", "TRIG", "CHOLHDL", "LDLDIRECT" in the last 72 hours. Thyroid function studies No results for input(s): "TSH", "T4TOTAL", "T3FREE", "THYROIDAB" in the last 72 hours.  Invalid input(s): "FREET3" Anemia work up No results for input(s): "VITAMINB12", "FOLATE", "FERRITIN", "TIBC", "IRON", "RETICCTPCT" in the last 72 hours. Urinalysis    Component Value Date/Time   COLORURINE AMBER (A) 04/20/2019 1438   APPEARANCEUR CLEAR 04/20/2019 1438   LABSPEC 1.017 04/20/2019 1438   PHURINE 5.0 04/20/2019 1438   GLUCOSEU NEGATIVE 04/20/2019 1438   HGBUR MODERATE (A) 04/20/2019 1438  HGBUR 2+ 09/22/2007 1444   BILIRUBINUR NEGATIVE 04/20/2019 1438   BILIRUBINUR n 11/07/2015 1417   KETONESUR NEGATIVE 04/20/2019 1438   PROTEINUR NEGATIVE 04/20/2019 1438   UROBILINOGEN 1.0 11/07/2015 1417   UROBILINOGEN 1.0 05/22/2011 1454   NITRITE NEGATIVE 04/20/2019 1438   LEUKOCYTESUR NEGATIVE 04/20/2019 1438   Sepsis Labs Recent Labs  Lab 09/01/23 0843 09/03/23 0357  WBC 12.1* 13.2*   Microbiology Recent Results (from the past 240 hours)  Resp panel by RT-PCR (RSV, Flu A&B, Covid) Anterior Nasal Swab     Status: None   Collection Time: 08/29/23  3:26 AM   Specimen: Anterior Nasal Swab  Result Value Ref Range Status   SARS Coronavirus 2 by RT PCR NEGATIVE NEGATIVE Final    Comment: (NOTE) SARS-CoV-2 target nucleic acids are NOT DETECTED.  The SARS-CoV-2 RNA is generally detectable in upper respiratory specimens during the acute phase of infection. The lowest concentration of SARS-CoV-2 viral copies this assay can detect is 138 copies/mL. A negative result does not preclude SARS-Cov-2 infection and should not be used as the sole basis for treatment or other patient management decisions. A negative result may occur with  improper  specimen collection/handling, submission of specimen other than nasopharyngeal swab, presence of viral mutation(s) within the areas targeted by this assay, and inadequate number of viral copies(<138 copies/mL). A negative result must be combined with clinical observations, patient history, and epidemiological information. The expected result is Negative.  Fact Sheet for Patients:  BloggerCourse.com  Fact Sheet for Healthcare Providers:  SeriousBroker.it  This test is no t yet approved or cleared by the [place] FDA and  has been authorized for detection and/or diagnosis of SARS-CoV-2 by FDA under an Emergency Use Authorization (EUA). This EUA will remain  in effect (meaning this test can be used) for the duration of the COVID-19 declaration under Section 564(b)(1) of the Act, 21 U.S.C.section 360bbb-3(b)(1), unless the authorization is terminated  or revoked sooner.       Influenza A by PCR NEGATIVE NEGATIVE Final   Influenza B by PCR NEGATIVE NEGATIVE Final    Comment: (NOTE) The Xpert Xpress SARS-CoV-2/FLU/RSV plus assay is intended as an aid in the diagnosis of influenza from Nasopharyngeal swab specimens and should not be used as a sole basis for treatment. Nasal washings and aspirates are unacceptable for Xpert Xpress SARS-CoV-2/FLU/RSV testing.  Fact Sheet for Patients: BloggerCourse.com  Fact Sheet for Healthcare Providers: SeriousBroker.it  This test is not yet approved or cleared by the [place] FDA and has been authorized for detection and/or diagnosis of SARS-CoV-2 by FDA under an Emergency Use Authorization (EUA). This EUA will remain in effect (meaning this test can be used) for the duration of the COVID-19 declaration under Section 564(b)(1) of the Act, 21 U.S.C. section 360bbb-3(b)(1), unless the authorization is terminated or revoked.     Resp  Syncytial Virus by PCR NEGATIVE NEGATIVE Final    Comment: (NOTE) Fact Sheet for Patients: BloggerCourse.com  Fact Sheet for Healthcare Providers: SeriousBroker.it  This test is not yet approved or cleared by the [place] FDA and has been authorized for detection and/or diagnosis of SARS-CoV-2 by FDA under an Emergency Use Authorization (EUA). This EUA will remain in effect (meaning this test can be used) for the duration of the COVID-19 declaration under Section 564(b)(1) of the Act, 21 U.S.C. section 360bbb-3(b)(1), unless the authorization is terminated or revoked.  Performed at [facility], [address] [place], [place] [address]  Culture, blood (routine x 2)     Status: None   Collection Time: 08/29/23  9:00 AM   Specimen: BLOOD  Result Value Ref Range Status   Specimen Description   Final    BLOOD LEFT ANTECUBITAL Performed at [facility], [address], [place], [place] [address]    Special Requests   Final    BOTTLES DRAWN AEROBIC AND ANAEROBIC Blood Culture results may not be optimal due to an inadequate volume of blood received in culture bottles Performed at [facility], [address] [place], [place] [address]    Culture   Final    NO GROWTH 5 DAYS Performed at [facility], [address] [place], [place] [address]    Report Status 09/03/2023 FINAL  Final  Culture, blood (routine x 2)     Status: None   Collection Time: 08/29/23  9:05 AM   Specimen: BLOOD  Result Value Ref Range Status   Specimen Description   Final    BLOOD LEFT ANTECUBITAL Performed at [facility], [address] [place], [place] [address]    Special Requests   Final    BOTTLES DRAWN AEROBIC AND ANAEROBIC Blood Culture results may not be optimal due to an inadequate volume of blood received in culture bottles Performed at [facility], [address], [place], [place] [address]    Culture   Final    NO GROWTH 5 DAYS Performed at [facility], [address] [place], [place] [address]    Report Status 09/03/2023 FINAL  Final  Respiratory (~20 pathogens) panel by PCR     Status: None   Collection Time: 08/30/23 12:31 PM   Specimen: Nasopharyngeal Swab; Respiratory  Result Value Ref Range Status   Adenovirus NOT DETECTED NOT DETECTED Final   Coronavirus 229E NOT DETECTED NOT DETECTED Final    Comment: (NOTE) The Coronavirus on the Respiratory Panel, DOES NOT test for the novel  Coronavirus (2019 nCoV)    Coronavirus HKU1 NOT DETECTED NOT DETECTED Final   Coronavirus NL63 NOT DETECTED NOT DETECTED Final   Coronavirus OC43 NOT DETECTED NOT DETECTED Final   Metapneumovirus NOT DETECTED NOT DETECTED Final   Rhinovirus / Enterovirus NOT DETECTED NOT DETECTED Final   Influenza A NOT DETECTED NOT DETECTED Final   Influenza B NOT DETECTED NOT DETECTED Final   Parainfluenza Virus 1 NOT DETECTED NOT DETECTED Final   Parainfluenza Virus 2 NOT DETECTED NOT DETECTED Final   Parainfluenza Virus 3 NOT DETECTED NOT DETECTED Final   Parainfluenza Virus 4 NOT DETECTED NOT DETECTED Final   Respiratory Syncytial Virus NOT DETECTED NOT DETECTED Final   Bordetella pertussis NOT DETECTED NOT DETECTED Final   Bordetella Parapertussis NOT DETECTED NOT DETECTED Final   Chlamydophila pneumoniae NOT DETECTED NOT DETECTED Final   Mycoplasma pneumoniae NOT DETECTED NOT DETECTED Final    Comment: Performed at [facility], [address], [place], [place] [address]  Expectorated Sputum Assessment w Gram Stain, Rflx to Resp Cult     Status: None   Collection Time: 08/30/23 12:31 PM   Specimen: Expectorated Sputum  Result Value Ref Range Status   Specimen Description EXPECTORATED SPUTUM  Final   Special Requests NONE  Final   Sputum evaluation   Final    THIS SPECIMEN IS ACCEPTABLE FOR SPUTUM CULTURE Performed at  [facility], [address], [place], [place] [address]    Report  Status 08/30/2023 FINAL  Final  Culture, Respiratory w Gram Stain     Status: None   Collection Time: 08/30/23 12:31 PM  Result Value Ref Range Status   Specimen Description   Final    EXPECTORATED SPUTUM Performed at [facility], [address] [place], [place] [address]    Special Requests   Final    NONE Reflexed from [id number] Performed at [facility], [address], [place], [place] [address]    Gram Stain   Final    MODERATE WBC PRESENT, PREDOMINANTLY PMN RARE GRAM NEGATIVE RODS RARE GRAM POSITIVE RODS RARE GRAM POSITIVE COCCI    Culture   Final    Normal respiratory flora-no Staph aureus or Pseudomonas seen Performed at [facility], [address], [place], [place] [address]    Report Status 09/01/2023 FINAL  Final  MRSA Next Gen by PCR, Nasal     Status: Abnormal   Collection Time: 08/31/23 11:51 AM  Result Value Ref Range Status   MRSA by PCR Next Gen DETECTED (A) NOT DETECTED Final    Comment: (NOTE) The GeneXpert MRSA Assay (FDA approved for NASAL specimens only), is one component of a comprehensive MRSA colonization surveillance program. It is not intended to diagnose MRSA infection nor to guide or monitor treatment for MRSA infections. Test performance is not FDA approved in patients less than 24 years old. Performed at [facility], [address], [place], [place] [address]     Please note: You were cared for by a hospitalist during your hospital stay. Once you are discharged, your primary care physician will handle any further medical issues. Please note that NO REFILLS for any discharge medications will be authorized once you are discharged, as it is imperative that you return to your primary care physician (or establish a relationship with a primary care physician if you do not have one) for your post hospital  discharge needs so that they can reassess your need for medications and monitor your lab values.    Time coordinating discharge: 40 minutes  SIGNED:   Burnadette Pop, MD  [facility] 09/07/2023, 1:04 PM Pager [phone]  If 7PM-7AM, please contact night-coverage [email or web address] Password [email or web address]

## 2023-09-07 NOTE — Progress Notes (Signed)
 Pt states he can not afford the med " Yupelri " recommended by pulmonology. It is over $300 w/ insurance- Pt requested an alternate. Duo neb to be sent home per Dr Renford Dills. Pt & wife confirmed pt has a nebulizer at home

## 2023-09-07 NOTE — Progress Notes (Signed)
 Call to patient and his wife to review AVS over the phone- pt had left w/o the AVS being reviewed in whole- meds were reviewed w/ pt  &wife prior to d/c- call back # left for questions. Voice mail left w/ AVS instructions

## 2023-09-07 NOTE — Progress Notes (Signed)
 This RN assisted pt with dressing for home. To lobby via w/c on O2. Rollator placed in car- pt home w/ wife

## 2023-09-08 ENCOUNTER — Telehealth: Payer: Self-pay

## 2023-09-08 NOTE — Transitions of Care (Post Inpatient/ED Visit) (Signed)
   09/08/2023  Name: Wayne Booth MRN: [id number] DOB: [birth year]  Today's TOC FU Call Status: Today's TOC FU Call Status:: Unsuccessful Call (1st Attempt) Unsuccessful Call (1st Attempt) Date: 09/08/23  Attempted to reach the patient regarding the most recent Inpatient/ED visit. Patient answered call but asked RN CM to contact spouse, who did now answer and RN CM left a confidential voice mail with call back number for today.   Follow Up Plan: Additional outreach attempts will be made to reach the patient to complete the Transitions of Care (Post Inpatient/ED visit) call.    Gabriel Cirri MSN, RN RN Case Sales executive Health  [facility]-Population Health Office Hours Wed/Thur  8:00 am-6:00 pm Direct Dial: [phone] Main Phone [phone]  Fax: [phone] [email or web address]

## 2023-09-09 ENCOUNTER — Telehealth: Payer: Self-pay

## 2023-09-09 NOTE — Transitions of Care (Post Inpatient/ED Visit) (Signed)
   09/09/2023  Name: Wayne Booth MRN: [id number] DOB: October 10, 1940  Today's TOC FU Call Status: Today's TOC FU Call Status:: Unsuccessful Call (2nd Attempt) Unsuccessful Call (2nd Attempt) Date: 09/09/23  Attempted to reach the patient regarding the most recent Inpatient/ED visit.  Note: TOC post discharge outreach #2 via spouse contact information per patient request on 09/09/23 outreach attempt.   Follow Up Plan: Additional outreach attempts will be made to reach the patient to complete the Transitions of Care (Post Inpatient/ED visit) call.    Gabriel Cirri MSN, RN RN Case Sales executive Health  [facility]BC[facility]-Population Health Office Hours Wed/Thur  8:00 am-6:00 pm Direct Dial: [phone] Main Phone [phone]  Fax: [phone] [email or web address]

## 2023-09-10 ENCOUNTER — Telehealth: Payer: Self-pay | Admitting: *Deleted

## 2023-09-10 NOTE — Transitions of Care (Post Inpatient/ED Visit) (Signed)
   09/10/2023  Name: AUGUSTINE LEVERETTE MRN: [id number] DOB: May 18, 1941  Today's TOC FU Call Status: Today's TOC FU Call Status:: Unsuccessful Call (3rd Attempt) Unsuccessful Call (3rd Attempt) Date: 09/10/23  Attempted to reach the patient regarding the most recent Inpatient/ED visit.  Follow Up Plan: No further outreach attempts will be made at this time. We have been unable to contact the patient.  Irving Shows [facility], BSN RN Care Manager/ Transition of Care [place]/ [facility] [phone]

## 2023-09-13 ENCOUNTER — Emergency Department (HOSPITAL_COMMUNITY)

## 2023-09-13 ENCOUNTER — Observation Stay (HOSPITAL_COMMUNITY)
Admission: EM | Admit: 2023-09-13 | Discharge: 2023-09-17 | Disposition: A | Discharge status: Home / Self Care | Attending: Internal Medicine | Admitting: Internal Medicine

## 2023-09-13 ENCOUNTER — Encounter (HOSPITAL_COMMUNITY): Payer: Self-pay | Admitting: Internal Medicine

## 2023-09-13 ENCOUNTER — Other Ambulatory Visit: Payer: Self-pay

## 2023-09-13 DIAGNOSIS — Z79899 Other long term (current) drug therapy: Secondary | ICD-10-CM | POA: Diagnosis not present

## 2023-09-13 DIAGNOSIS — R918 Other nonspecific abnormal finding of lung field: Secondary | ICD-10-CM | POA: Diagnosis not present

## 2023-09-13 DIAGNOSIS — J849 Interstitial pulmonary disease, unspecified: Secondary | ICD-10-CM | POA: Insufficient documentation

## 2023-09-13 DIAGNOSIS — R0602 Shortness of breath: Secondary | ICD-10-CM | POA: Diagnosis not present

## 2023-09-13 DIAGNOSIS — F411 Generalized anxiety disorder: Secondary | ICD-10-CM | POA: Diagnosis not present

## 2023-09-13 DIAGNOSIS — R0989 Other specified symptoms and signs involving the circulatory and respiratory systems: Secondary | ICD-10-CM | POA: Diagnosis not present

## 2023-09-13 DIAGNOSIS — J9621 Acute and chronic respiratory failure with hypoxia: Secondary | ICD-10-CM | POA: Diagnosis not present

## 2023-09-13 DIAGNOSIS — R0689 Other abnormalities of breathing: Secondary | ICD-10-CM | POA: Diagnosis not present

## 2023-09-13 DIAGNOSIS — Z9981 Dependence on supplemental oxygen: Secondary | ICD-10-CM | POA: Diagnosis not present

## 2023-09-13 DIAGNOSIS — J449 Chronic obstructive pulmonary disease, unspecified: Secondary | ICD-10-CM | POA: Insufficient documentation

## 2023-09-13 DIAGNOSIS — F1090 Alcohol use, unspecified, uncomplicated: Secondary | ICD-10-CM | POA: Insufficient documentation

## 2023-09-13 DIAGNOSIS — F32A Depression, unspecified: Secondary | ICD-10-CM | POA: Diagnosis not present

## 2023-09-13 DIAGNOSIS — G9009 Other idiopathic peripheral autonomic neuropathy: Secondary | ICD-10-CM | POA: Insufficient documentation

## 2023-09-13 DIAGNOSIS — J90 Pleural effusion, not elsewhere classified: Secondary | ICD-10-CM | POA: Diagnosis not present

## 2023-09-13 LAB — CBC WITH DIFFERENTIAL/PLATELET
Abs Immature Granulocytes: 0.09 10*3/uL — ABNORMAL HIGH (ref 0.00–0.07)
Basophils Absolute: 0 10*3/uL (ref 0.0–0.1)
Basophils Relative: 0 %
Eosinophils Absolute: 0.1 10*3/uL (ref 0.0–0.5)
Eosinophils Relative: 1 %
HCT: 48.1 % (ref 39.0–52.0)
Hemoglobin: 15.6 g/dL (ref 13.0–17.0)
Immature Granulocytes: 1 %
Lymphocytes Relative: 10 %
Lymphs Abs: 0.8 10*3/uL (ref 0.7–4.0)
MCH: 29.9 pg (ref 26.0–34.0)
MCHC: 32.4 g/dL (ref 30.0–36.0)
MCV: 92.1 fL (ref 80.0–100.0)
Monocytes Absolute: 0.5 10*3/uL (ref 0.1–1.0)
Monocytes Relative: 6 %
Neutro Abs: 6.8 10*3/uL (ref 1.7–7.7)
Neutrophils Relative %: 82 %
Platelets: 239 10*3/uL (ref 150–400)
RBC: 5.22 MIL/uL (ref 4.22–5.81)
RDW: 14.7 % (ref 11.5–15.5)
WBC: 8.2 10*3/uL (ref 4.0–10.5)
nRBC: 0 % (ref 0.0–0.2)

## 2023-09-13 LAB — COMPREHENSIVE METABOLIC PANEL
ALT: 33 U/L (ref 0–44)
AST: 26 U/L (ref 15–41)
Albumin: 2.6 g/dL — ABNORMAL LOW (ref 3.5–5.0)
Alkaline Phosphatase: 44 U/L (ref 38–126)
Anion gap: 6 (ref 5–15)
BUN: 14 mg/dL (ref 8–23)
CO2: 25 mmol/L (ref 22–32)
Calcium: 8 mg/dL — ABNORMAL LOW (ref 8.9–10.3)
Chloride: 104 mmol/L (ref 98–111)
Creatinine, Ser: 0.83 mg/dL (ref 0.61–1.24)
GFR, Estimated: >60 mL/min (ref >60)
Glucose, Bld: 108 mg/dL — ABNORMAL HIGH (ref 70–99)
Potassium: 3.8 mmol/L (ref 3.5–5.1)
Sodium: 135 mmol/L (ref 135–145)
Total Bilirubin: 0.8 mg/dL (ref 0.0–1.2)
Total Protein: 6.8 g/dL (ref 6.5–8.1)

## 2023-09-13 LAB — BRAIN NATRIURETIC PEPTIDE: B Natriuretic Peptide: 59.9 pg/mL (ref 0.0–100.0)

## 2023-09-13 LAB — TROPONIN I (HIGH SENSITIVITY)
Troponin I (High Sensitivity): 4 ng/L (ref <18)
Troponin I (High Sensitivity): 4 ng/L (ref <18)

## 2023-09-13 LAB — BLOOD GAS, VENOUS
Acid-Base Excess: 4.6 mmol/L — ABNORMAL HIGH (ref 0.0–2.0)
Bicarbonate: 29.9 mmol/L — ABNORMAL HIGH (ref 20.0–28.0)
O2 Saturation: 91.9 %
Patient temperature: 37
pCO2, Ven: 45 mmHg (ref 44–60)
pH, Ven: 7.43 (ref 7.25–7.43)
pO2, Ven: 58 mmHg — ABNORMAL HIGH (ref 32–45)

## 2023-09-13 LAB — LACTIC ACID, PLASMA: Lactic Acid, Venous: 1 mmol/L (ref 0.5–1.9)

## 2023-09-13 MED ORDER — ALBUTEROL SULFATE (2.5 MG/3ML) 0.083% IN NEBU
5.0000 mg | INHALATION_SOLUTION | Freq: Once | RESPIRATORY_TRACT | Status: AC
  Administered 2023-09-13: 5 mg via RESPIRATORY_TRACT
  Filled 2023-09-13: qty 6

## 2023-09-13 MED ORDER — ONDANSETRON HCL 4 MG PO TABS: 4.0000 mg | ORAL_TABLET | Freq: Four times a day (QID) | ORAL | Status: DC | PRN

## 2023-09-13 MED ORDER — ACETAMINOPHEN 650 MG RE SUPP: 650.0000 mg | Freq: Four times a day (QID) | RECTAL | Status: DC | PRN

## 2023-09-13 MED ORDER — FUROSEMIDE 20 MG PO TABS
20.0000 mg | ORAL_TABLET | Freq: Every day | ORAL | Status: DC
  Administered 2023-09-13 – 2023-09-17 (×5): 20 mg via ORAL
  Filled 2023-09-13 (×5): qty 1

## 2023-09-13 MED ORDER — ENOXAPARIN SODIUM 40 MG/0.4ML IJ SOSY
40.0000 mg | PREFILLED_SYRINGE | Freq: Every day | INTRAMUSCULAR | Status: DC
  Administered 2023-09-13 – 2023-09-16 (×4): 40 mg via SUBCUTANEOUS
  Filled 2023-09-13 (×4): qty 0.4

## 2023-09-13 MED ORDER — BUSPIRONE HCL 10 MG PO TABS: 5.0000 mg | ORAL_TABLET | Freq: Two times a day (BID) | ORAL | Status: DC | PRN

## 2023-09-13 MED ORDER — BUDESONIDE 0.25 MG/2ML IN SUSP
0.2500 mg | Freq: Two times a day (BID) | RESPIRATORY_TRACT | Status: DC
  Administered 2023-09-13 – 2023-09-17 (×9): 0.25 mg via RESPIRATORY_TRACT
  Filled 2023-09-13 (×10): qty 2

## 2023-09-13 MED ORDER — TRAZODONE HCL 50 MG PO TABS
25.0000 mg | ORAL_TABLET | Freq: Once | ORAL | Status: AC
  Administered 2023-09-13: 25 mg via ORAL
  Filled 2023-09-13: qty 1

## 2023-09-13 MED ORDER — ONDANSETRON HCL 4 MG/2ML IJ SOLN: 4.0000 mg | Freq: Four times a day (QID) | INTRAMUSCULAR | Status: DC | PRN

## 2023-09-13 MED ORDER — IPRATROPIUM-ALBUTEROL 0.5-2.5 (3) MG/3ML IN SOLN
3.0000 mL | RESPIRATORY_TRACT | Status: DC | PRN
Start: 2023-09-13 — End: 2023-09-17
  Administered 2023-09-16: 3 mL via RESPIRATORY_TRACT
  Filled 2023-09-13 (×2): qty 3

## 2023-09-13 MED ORDER — ACETAMINOPHEN 325 MG PO TABS: 650.0000 mg | ORAL_TABLET | Freq: Four times a day (QID) | ORAL | Status: DC | PRN

## 2023-09-13 MED ORDER — ESCITALOPRAM OXALATE 20 MG PO TABS
20.0000 mg | ORAL_TABLET | Freq: Every day | ORAL | Status: DC
  Administered 2023-09-13 – 2023-09-17 (×5): 20 mg via ORAL
  Filled 2023-09-13 (×4): qty 1
  Filled 2023-09-13: qty 2

## 2023-09-13 MED ORDER — PREDNISONE 5 MG PO TABS
10.0000 mg | ORAL_TABLET | Freq: Every day | ORAL | Status: AC
  Administered 2023-09-13 – 2023-09-14 (×2): 10 mg via ORAL
  Filled 2023-09-13 (×2): qty 2

## 2023-09-13 MED ORDER — GABAPENTIN 300 MG PO CAPS
300.0000 mg | ORAL_CAPSULE | Freq: Three times a day (TID) | ORAL | Status: DC
  Administered 2023-09-13 – 2023-09-17 (×13): 300 mg via ORAL
  Filled 2023-09-13 (×13): qty 1

## 2023-09-13 MED ORDER — PREDNISONE 5 MG PO TABS
5.0000 mg | ORAL_TABLET | Freq: Every day | ORAL | Status: AC
  Administered 2023-09-15 – 2023-09-17 (×3): 5 mg via ORAL
  Filled 2023-09-13 (×3): qty 1

## 2023-09-13 NOTE — Care Management Obs Status (Signed)
 MEDICARE OBSERVATION STATUS NOTIFICATION   Patient Details  Name: RIK WADEL MRN: [id number] Date of Birth: 07-04-1940   Medicare Observation Status Notification Given:  Yes    Lavenia Atlas, RN 09/13/2023, 3:59 PM

## 2023-09-13 NOTE — H&P (Addendum)
 History and Physical  ABDUL BEIRNE WUJ:[id number] DOB: 1941-03-30 DOA: 09/13/2023  PCP: Shelva Majestic, MD   Chief Complaint: Shortness of breath  HPI: Wayne Booth is a 83 y.o. male with medical history significant for COPD, interstitial lung disease, chronic hypoxia on 4-5 l nasal cannula oxygen at baseline recently treated in the hospital for multifocal community-acquired pneumonia now being admitted to the hospital due to acute on chronic hypoxic respiratory failure.  History is provided by the patient, who was discharged from the hospital on 3/11 after completing 5 days of empiric IV antibiotics.  He was discharged on a prednisone taper, which she continues.  He tells me that initially after getting home with his wife, he was doing relatively well but feels that over the last couple of days he got progressively more short of breath.  States that he has a chronic nonproductive cough, denies any fevers, chest pain, nausea, vomiting or other concerns.  Apparently when EMS was called to his home early this morning, he was noted to be saturating 88% on his baseline 5 L of oxygen. Per EMS, his oxygen tubing was kinked and very long, but despite repositioning this his oxygen saturations stayed below 90%.  He was given Solu-Medrol and DuoNeb, with improvement in his symptoms.  Here in the emergency department he was just given an albuterol nebulizer and oxygen saturation has remained normal on his baseline oxygen.  Lab workup as detailed below as well as chest x-ray without any acute findings.  There is apparently also some concern that he did not know how to use his home nebulizer machine properly.  Review of Systems: Please see HPI for pertinent positives and negatives. A complete 10 system review of systems are otherwise negative.  Past Medical History:  Diagnosis Date   Allergy    States his nose runs when he is around grease.    Anxiety    Arthritis    Atrial fibrillation (HCC)     Cancer (HCC)    Coronary artery disease    History of total knee replacement    Hypertension    Hypothyroidism    Kidney cancer, primary, with metastasis from kidney to other site [facility])    Kidney stone    OSA (obstructive sleep apnea)    pt does not wear cpap at night   Pituitary cyst (HCC)    Pneumonia    hx of   Prostate cancer [facility])    Past Surgical History:  Procedure Laterality Date   ABLATION OF DYSRHYTHMIC FOCUS  01/28/2018   ANTERIOR CERVICAL DECOMP/DISCECTOMY FUSION N/A 08/29/2012   Procedure: ANTERIOR CERVICAL DECOMPRESSION/DISCECTOMY FUSION 1 LEVEL;  Surgeon: Tia Alert, MD;  Location: [facility] NEURO ORS;  Service: Neurosurgery;  Laterality: N/A;   APPENDECTOMY     ATRIAL FIBRILLATION ABLATION N/A 01/28/2018   Procedure: ATRIAL FIBRILLATION ABLATION;  Surgeon: Regan Lemming, MD;  Location: [facility] INVASIVE CV LAB;  Service: Cardiovascular;  Laterality: N/A;   CARDIAC CATHETERIZATION  2008   clean   CHOLECYSTECTOMY     COLONOSCOPY W/ POLYPECTOMY     CRANIOTOMY N/A 11/08/2012   Procedure: CRANIOTOMY HYPOPHYSECTOMY TRANSNASAL APPROACH;  Surgeon: [name], MD;  Location: [facility] NEURO ORS;  Service: Neurosurgery;  Laterality: N/A;  Transphenoidal Resection of Pituitary Tumor   FINGER SURGERY Left 2017   Dr Amanda Pea   INSERTION PROSTATE RADIATION SEED     IR THORACENTESIS ASP PLEURAL SPACE W/IMG GUIDE  04/15/2018   KIDNEY STONE SURGERY  KNEE ARTHROSCOPY  2007   left   NEPHRECTOMY Left    POSTERIOR CERVICAL LAMINECTOMY Left 04/24/2015   Procedure: Foraminotomy cervical five - cervical six cervical six - cervical seven left;  Surgeon: Tia Alert, MD;  Location: [facility] NEURO ORS;  Service: Neurosurgery;  Laterality: Left;   REPLACEMENT TOTAL KNEE BILATERAL     RIGHT/LEFT HEART CATH AND CORONARY ANGIOGRAPHY N/A 07/14/2019   Procedure: RIGHT/LEFT HEART CATH AND CORONARY ANGIOGRAPHY;  Surgeon: Corky Crafts, MD;  Location: [facility] INVASIVE CV LAB;  Service: Cardiovascular;   Laterality: N/A;   Social History:  reports that he has never smoked. He has never used smokeless tobacco. He reports current alcohol use of about 2.0 standard drinks of alcohol per week. He reports that he does not use drugs.  Allergies  Allergen Reactions   Ambien [Zolpidem] Anxiety    Anxiety and agitation when tried as sleeper in hospital aug 2019    Family History  Problem Relation Age of Onset   Cancer Father        ?mouth, died before patient born   Cancer Mother        stomach, died when he was young   Kidney cancer Sister    Colon cancer Brother 72   Esophageal cancer Neg Hx    Rectal cancer Neg Hx    Stomach cancer Neg Hx      Prior to Admission medications   Medication Sig Start Date End Date Taking? Authorizing Provider  albuterol (PROVENTIL) (2.5 MG/3ML) 0.083% nebulizer solution Take 3 mLs (2.5 mg total) by nebulization every 6 (six) hours as needed for wheezing or shortness of breath. 10/12/22   Parrett, Virgel Bouquet, NP  albuterol (VENTOLIN HFA) 108 (90 Base) MCG/ACT inhaler INHALE 2 PUFFS BY MOUTH EVERY 6 HOURS AS NEEDED FOR WHEEZING OR SHORTNESS OF BREATH Patient taking differently: Inhale 2 puffs into the lungs every 6 (six) hours as needed for wheezing or shortness of breath. 07/13/23   Parrett, Tammy S, NP  budesonide (PULMICORT) 0.25 MG/2ML nebulizer solution Take 2 mLs (0.25 mg total) by nebulization 2 (two) times daily. 09/07/23   Burnadette Pop, MD  busPIRone (BUSPAR) 5 MG tablet TAKE ONE TABLET BY MOUTH TWICE A DAY AS NEEDED FOR ANXIETY Patient taking differently: Take 5 mg by mouth 2 (two) times daily as needed (anxiety). TAKE ONE TABLET BY MOUTH TWICE A DAY AS NEEDED FOR ANXIETY 01/18/23   Shelva Majestic, MD  escitalopram (LEXAPRO) 20 MG tablet TAKE 1 TABLET BY MOUTH DAILY 06/07/23   Shelva Majestic, MD  furosemide (LASIX) 20 MG tablet Take 1 tablet (20 mg total) by mouth daily. 09/08/23   Burnadette Pop, MD  gabapentin (NEURONTIN) 300 MG capsule Take 1  capsule (300 mg total) by mouth 3 (three) times daily. 12/25/22   Shelva Majestic, MD  ipratropium-albuterol (DUONEB) 0.5-2.5 (3) MG/3ML SOLN Take 3 mLs by nebulization every 6 (six) hours as needed. 09/07/23   Burnadette Pop, MD  LORazepam (ATIVAN) 0.5 MG tablet Take 1 tablet (0.5 mg total) by mouth daily as needed for anxiety (do not take within 8 hours of bed. do not drive for 8 hours after taking.). 04/30/23   Shelva Majestic, MD  predniSONE (DELTASONE) 5 MG tablet Take 6 tablets (30 mg total) by mouth daily with breakfast for 1 day, THEN 4 tablets (20 mg total) daily with breakfast for 3 days, THEN 2 tablets (10 mg total) daily with breakfast for 3 days, THEN 1 tablet (  5 mg total) daily with breakfast for 3 days. 09/08/23 09/18/23  Burnadette Pop, MD  traMADol (ULTRAM) 50 MG tablet Take 1 tablet (50 mg total) by mouth every 6 (six) hours as needed (prefer max 3 per day). 01/30/22   Ardith Dark, MD  traZODone (DESYREL) 50 MG tablet Take 0.5-1 tablets (25-50 mg total) by mouth at bedtime as needed for sleep. 12/25/22   Shelva Majestic, MD    Physical Exam: BP (!) 133/97   Pulse 78   Temp 97.7 F (36.5 C) (Oral)   Resp 18   SpO2 93%  General:  Alert, oriented x 4, calm, he does appear dyspneic but not in acute distress, elderly gentleman who appears his stated age. Eyes: EOMI, clear conjuctivae, white sclerea Neck: supple, no masses, trachea mildline  Cardiovascular: RRR, no murmurs or rubs, no peripheral edema  Respiratory: He has good equal bilateral air entry in the upper lung fields, with diffuse crackles especially at the bilateral bases.  No active wheezing or rhonchi. Abdomen: soft, nontender, nondistended, normal bowel tones heard  Skin: dry, no rashes  Musculoskeletal: no joint effusions, normal range of motion  Psychiatric: appropriate affect, normal speech  Neurologic: extraocular muscles intact, clear speech, moving all extremities with intact sensorium         Labs on  Admission:  Basic Metabolic Panel: Recent Labs  Lab 09/07/23 0408 09/13/23 0515  NA 136 135  K 3.8 3.8  CL 97* 104  CO2 30 25  GLUCOSE 101* 108*  BUN 26* 14  CREATININE 1.00 0.83  CALCIUM 8.3* 8.0*   Liver Function Tests: Recent Labs  Lab 09/13/23 0515  AST 26  ALT 33  ALKPHOS 44  BILITOT 0.8  PROT 6.8  ALBUMIN 2.6*   No results for input(s): "LIPASE", "AMYLASE" in the last 168 hours. No results for input(s): "AMMONIA" in the last 168 hours. CBC: Recent Labs  Lab 09/13/23 0515  WBC 8.2  NEUTROABS 6.8  HGB 15.6  HCT 48.1  MCV 92.1  PLT 239   Cardiac Enzymes: No results for input(s): "CKTOTAL", "CKMB", "CKMBINDEX", "TROPONINI" in the last 168 hours. BNP (last 3 results) Recent Labs    08/29/23 0326 09/13/23 0515  BNP 38.3 59.9    ProBNP (last 3 results) No results for input(s): "PROBNP" in the last 8760 hours.  CBG: No results for input(s): "GLUCAP" in the last 168 hours.  Radiological Exams on Admission: DG Chest Port 1 View Result Date: 09/13/2023 CLINICAL DATA:  sob EXAM: PORTABLE CHEST 1 VIEW COMPARISON:  Chest x-ray 08/29/2023, CT chest 08/29/2023 FINDINGS: More prominent mediastinum due to AP portable technique. The heart and mediastinal contours are unchanged. Atherosclerotic plaque. Right apex collimated off view due to overlying mandible. Low lung volumes. Persistent bilateral patchy airspace opacities, more prominent throughout the right lung and at the left lower lung zone. At least bilateral trace to small volume pleural effusions. No pneumothorax. No acute osseous abnormality. IMPRESSION: 1. Low lung volumes with persistent bilateral patchy airspace opacities. 2.  At least bilateral trace to small volume pleural effusions. Electronically Signed   By: Tish Frederickson M.D.   On: 09/13/2023 04:56   Assessment/Plan CRISTOPHER CICCARELLI is a 83 y.o. male with medical history significant for COPD, interstitial lung disease, chronic hypoxia on 4-5 l nasal  cannula oxygen at baseline recently treated in the hospital for multifocal community-acquired pneumonia now being admitted to the hospital due to acute on chronic hypoxic respiratory failure.   Acute on  chronic hypoxic respiratory failure-in the setting of COPD, chronic hypoxia, and interstitial lung disease.  Patient has recovered well after some breathing treatments, I suspect he likely has some underlying progression of his ILD, but may also have had some difficulty managing oxygen and nebulizer treatment at home.  Wife states that he has been refusing his Pulmicort nebulizer at home. -Inpatient admission -Continue supplemental oxygen, he is currently at his baseline 5 L -Pulmicort twice daily, with DuoNeb as needed -Continue prednisone taper, currently at 10 mg p.o. daily -No indication for antibiotics -Continue daily Lasix -He has seen palliative care as an outpatient in the past, has not seen pulmonary in the outpatient setting in almost a year.  Will consult palliative care to discuss natural progression of this disease, as well as elucidate goals of care.  I feel that he may be best served with hospice services at home.  Peripheral neuropathy-Neurontin  Depression/anxiety-continue BuSpar as needed, with daily Lexapro  DVT prophylaxis: Lovenox     Code Status: Full Code  Family communication: I was able to speak with his wife over the phone.  Consults called: Palliative care  Admission status: The appropriate patient status for this patient is INPATIENT. Inpatient status is judged to be reasonable and necessary in order to provide the required intensity of service to ensure the patient's safety. The patient's presenting symptoms, physical exam findings, and initial radiographic and laboratory data in the context of their chronic comorbidities is felt to place them at high risk for further clinical deterioration. Furthermore, it is not anticipated that the patient will be medically  stable for discharge from the hospital within 2 midnights of admission.    I certify that at the point of admission it is my clinical judgment that the patient will require inpatient hospital care spanning beyond 2 midnights from the point of admission due to high intensity of service, high risk for further deterioration and high frequency of surveillance required  Time spent: 56 minutes  Chandler Stofer Sharlette Dense MD [facility] Pager [phone]  If 7PM-7AM, please contact night-coverage [email or web address] Password [facility]  09/13/2023, 8:39 AM

## 2023-09-13 NOTE — Care Management CC44 (Signed)
 Condition Code 44 Documentation Completed  Patient Details  Name: Wayne Booth MRN: [id number] Date of Birth: [birth year]   Condition Code 44 given:  Yes Patient signature on Condition Code 44 notice:  Yes Documentation of 2 MD's agreement:  Yes Code 44 added to claim:  Yes    Lavenia Atlas, RN 09/13/2023, 4:00 PM

## 2023-09-13 NOTE — ED Triage Notes (Signed)
 Patient arrived with complaints of shob. Declines any pain. Oxygen saturation 88% on ems arrival, wears 5L at baseline. Recently admitted for pneumonia. Given solumedrol and duoneb en route.

## 2023-09-13 NOTE — ED Notes (Signed)
 Assisted Pt with urinal. 475cc of clear, yellow-straw urine noted.

## 2023-09-13 NOTE — ED Provider Notes (Signed)
 [place] EMERGENCY DEPARTMENT AT [facility] Provider Note   CSN: [id number] Arrival date & time: 09/13/23  0403     History  Chief Complaint  Patient presents with   Shortness of Breath    Wayne Booth is a 83 y.o. male.  The history is provided by the patient, medical records and the EMS personnel.  Shortness of Breath Wayne Booth is a 83 y.o. male who presents to the Emergency Department complaining of shortness of breath.  He presents to the emergency department by EMS for evaluation of shortness of breath.  He was just discharged from the hospital 1 week ago following admission for multifocal pneumonia.  He does not feel like he ever fully improved from his hospital discharge and significantly worsened over the last 24 hours with increased shortness of breath and difficulty performing ADLs.  No significant chest pain.  He has a nonproductive cough.  No fevers.  EMS report that his oxygen tubing was very long and kinked, but he had persistent hypoxia with sats in the upper 80s after repositioning the tubing.  He was treated with Solu-Medrol, DuoNeb without significant improvement in his symptoms.    He wears 5 L nasal cannula at baseline. Home Medications Prior to Admission medications   Medication Sig Start Date End Date Taking? Authorizing Provider  albuterol (PROVENTIL) (2.5 MG/3ML) 0.083% nebulizer solution Take 3 mLs (2.5 mg total) by nebulization every 6 (six) hours as needed for wheezing or shortness of breath. 10/12/22   Parrett, Virgel Bouquet, NP  albuterol (VENTOLIN HFA) 108 (90 Base) MCG/ACT inhaler INHALE 2 PUFFS BY MOUTH EVERY 6 HOURS AS NEEDED FOR WHEEZING OR SHORTNESS OF BREATH Patient taking differently: Inhale 2 puffs into the lungs every 6 (six) hours as needed for wheezing or shortness of breath. 07/13/23   Parrett, Tammy S, NP  budesonide (PULMICORT) 0.25 MG/2ML nebulizer solution Take 2 mLs (0.25 mg total) by nebulization 2 (two) times daily. 09/07/23    Burnadette Pop, MD  busPIRone (BUSPAR) 5 MG tablet TAKE ONE TABLET BY MOUTH TWICE A DAY AS NEEDED FOR ANXIETY Patient taking differently: Take 5 mg by mouth 2 (two) times daily as needed (anxiety). TAKE ONE TABLET BY MOUTH TWICE A DAY AS NEEDED FOR ANXIETY 01/18/23   Shelva Majestic, MD  escitalopram (LEXAPRO) 20 MG tablet TAKE 1 TABLET BY MOUTH DAILY 06/07/23   Shelva Majestic, MD  furosemide (LASIX) 20 MG tablet Take 1 tablet (20 mg total) by mouth daily. 09/08/23   Burnadette Pop, MD  gabapentin (NEURONTIN) 300 MG capsule Take 1 capsule (300 mg total) by mouth 3 (three) times daily. 12/25/22   Shelva Majestic, MD  ipratropium-albuterol (DUONEB) 0.5-2.5 (3) MG/3ML SOLN Take 3 mLs by nebulization every 6 (six) hours as needed. 09/07/23   Burnadette Pop, MD  LORazepam (ATIVAN) 0.5 MG tablet Take 1 tablet (0.5 mg total) by mouth daily as needed for anxiety (do not take within 8 hours of bed. do not drive for 8 hours after taking.). 04/30/23   Shelva Majestic, MD  predniSONE (DELTASONE) 5 MG tablet Take 6 tablets (30 mg total) by mouth daily with breakfast for 1 day, THEN 4 tablets (20 mg total) daily with breakfast for 3 days, THEN 2 tablets (10 mg total) daily with breakfast for 3 days, THEN 1 tablet (5 mg total) daily with breakfast for 3 days. 09/08/23 09/18/23  Burnadette Pop, MD  traMADol (ULTRAM) 50 MG tablet Take 1 tablet (50 mg  total) by mouth every 6 (six) hours as needed (prefer max 3 per day). 01/30/22   Ardith Dark, MD  traZODone (DESYREL) 50 MG tablet Take 0.5-1 tablets (25-50 mg total) by mouth at bedtime as needed for sleep. 12/25/22   Shelva Majestic, MD      Allergies    Ambien [zolpidem]    Review of Systems   Review of Systems  Respiratory:  Positive for shortness of breath.   All other systems reviewed and are negative.   Physical Exam Updated Vital Signs BP (!) 133/97   Pulse 78   Temp 97.7 F (36.5 C) (Oral)   Resp 18   SpO2 93%  Physical Exam Vitals and  nursing note reviewed.  Constitutional:      Appearance: He is well-developed.  HENT:     Head: Normocephalic and atraumatic.  Cardiovascular:     Rate and Rhythm: Normal rate and regular rhythm.     Heart sounds: No murmur heard. Pulmonary:     Effort: Pulmonary effort is normal. No respiratory distress.     Comments: Fine crackles in bilateral bases.  Decreased air movement in upper lung fields bilaterally Abdominal:     Palpations: Abdomen is soft.     Tenderness: There is no abdominal tenderness. There is no guarding or rebound.  Musculoskeletal:        General: No swelling or tenderness.  Skin:    General: Skin is warm and dry.  Neurological:     Mental Status: He is alert and oriented to person, place, and time.  Psychiatric:        Behavior: Behavior normal.     ED Results / Procedures / Treatments   Labs (all labs ordered are listed, but only abnormal results are displayed) Labs Reviewed  COMPREHENSIVE METABOLIC PANEL - Abnormal; Notable for the following components:      Result Value   Glucose, Bld 108 (*)    Calcium 8.0 (*)    Albumin 2.6 (*)    All other components within normal limits  CBC WITH DIFFERENTIAL/PLATELET - Abnormal; Notable for the following components:   Abs Immature Granulocytes 0.09 (*)    All other components within normal limits  BLOOD GAS, VENOUS - Abnormal; Notable for the following components:   pO2, Ven 58 (*)    Bicarbonate 29.9 (*)    Acid-Base Excess 4.6 (*)    All other components within normal limits  BRAIN NATRIURETIC PEPTIDE  LACTIC ACID, PLASMA  I-STAT CG4 LACTIC ACID, ED  I-STAT CG4 LACTIC ACID, ED  TROPONIN I (HIGH SENSITIVITY)  TROPONIN I (HIGH SENSITIVITY)    EKG None  Radiology DG Chest Port 1 View Result Date: 09/13/2023 CLINICAL DATA:  sob EXAM: PORTABLE CHEST 1 VIEW COMPARISON:  Chest x-ray 08/29/2023, CT chest 08/29/2023 FINDINGS: More prominent mediastinum due to AP portable technique. The heart and mediastinal  contours are unchanged. Atherosclerotic plaque. Right apex collimated off view due to overlying mandible. Low lung volumes. Persistent bilateral patchy airspace opacities, more prominent throughout the right lung and at the left lower lung zone. At least bilateral trace to small volume pleural effusions. No pneumothorax. No acute osseous abnormality. IMPRESSION: 1. Low lung volumes with persistent bilateral patchy airspace opacities. 2.  At least bilateral trace to small volume pleural effusions. Electronically Signed   By: Tish Frederickson M.D.   On: 09/13/2023 04:56    Procedures Procedures    Medications Ordered in ED Medications  albuterol (PROVENTIL) (2.5 MG/3ML) 0.083%  nebulizer solution 5 mg (5 mg Nebulization Given 09/13/23 1610)    ED Course/ Medical Decision Making/ A&P                                 Medical Decision Making Amount and/or Complexity of Data Reviewed Labs: ordered. Radiology: ordered.  Risk Prescription drug management.   Patient with history of COPD, interstitial lung disease on chronic oxygen therapy here for evaluation of progressive shortness of breath after recent hospitalization for multifocal pneumonia.  It does appear that he has a little bit of confusion over his home medications, administration and his oxygen tubing is suboptimal.  He did receive Solu-Medrol, DuoNeb prior to ED arrival by EMS.  He does have persistent tachypnea and shortness of breath on ED arrival.  He was treated with additional neb with improvement in his symptoms but persistent shortness of breath.  Imaging is stable when compared to priors.  CBC without significant leukocytosis.  BNP is unremarkable.  Suspect this is a combination of COPD exacerbation with progression of his interstitial lung disease.  Given degree of symptoms hospitalist consulted for observation for ongoing care.        Final Clinical Impression(s) / ED Diagnoses Final diagnoses:  Shortness of breath     Rx / DC Orders ED Discharge Orders     None         Tilden Fossa, MD 09/13/23 [phone]

## 2023-09-14 DIAGNOSIS — J9621 Acute and chronic respiratory failure with hypoxia: Secondary | ICD-10-CM | POA: Diagnosis not present

## 2023-09-14 DIAGNOSIS — Z7189 Other specified counseling: Secondary | ICD-10-CM | POA: Diagnosis not present

## 2023-09-14 DIAGNOSIS — J9611 Chronic respiratory failure with hypoxia: Secondary | ICD-10-CM | POA: Diagnosis not present

## 2023-09-14 DIAGNOSIS — Z515 Encounter for palliative care: Secondary | ICD-10-CM | POA: Diagnosis not present

## 2023-09-14 LAB — CBC
HCT: 48 % (ref 39.0–52.0)
Hemoglobin: 15.4 g/dL (ref 13.0–17.0)
MCH: 29.8 pg (ref 26.0–34.0)
MCHC: 32.1 g/dL (ref 30.0–36.0)
MCV: 92.8 fL (ref 80.0–100.0)
Platelets: 257 10*3/uL (ref 150–400)
RBC: 5.17 MIL/uL (ref 4.22–5.81)
RDW: 14.6 % (ref 11.5–15.5)
WBC: 9.8 10*3/uL (ref 4.0–10.5)
nRBC: 0 % (ref 0.0–0.2)

## 2023-09-14 LAB — BASIC METABOLIC PANEL
Anion gap: 6 (ref 5–15)
BUN: 20 mg/dL (ref 8–23)
CO2: 28 mmol/L (ref 22–32)
Calcium: 8.3 mg/dL — ABNORMAL LOW (ref 8.9–10.3)
Chloride: 102 mmol/L (ref 98–111)
Creatinine, Ser: 0.91 mg/dL (ref 0.61–1.24)
GFR, Estimated: >60 mL/min (ref >60)
Glucose, Bld: 104 mg/dL — ABNORMAL HIGH (ref 70–99)
Potassium: 4.4 mmol/L (ref 3.5–5.1)
Sodium: 136 mmol/L (ref 135–145)

## 2023-09-14 MED ORDER — LORAZEPAM 0.5 MG PO TABS
0.5000 mg | ORAL_TABLET | Freq: Every day | ORAL | Status: DC | PRN
  Administered 2023-09-14 – 2023-09-16 (×4): 0.5 mg via ORAL
  Filled 2023-09-14 (×4): qty 1

## 2023-09-14 NOTE — TOC Initial Note (Signed)
 Transition of Care [facility]) - Initial/Assessment Note    Patient Details  Name: Wayne Booth MRN: [id number] Date of Birth: October 12, 1940  Transition of Care [facility]) CM/SW Contact:    Lanier Clam, RN Phone Number: 09/14/2023, 11:18 AM  Clinical Narrative: Spoke to spouse Jody about d/c plans-prefer HHC informed await PT recc. May need HHPT/OT/csw-continue to follow.Has [facility] travel tank.Has own transport home.                    Barriers to Discharge: Continued Medical Work up   Patient Goals and CMS Choice Patient states their goals for this hospitalization and ongoing recovery are:: Home CMS Medicare.gov Compare Post Acute Care list provided to:: Patient Represenative (must comment) (Jody spouse) Choice offered to / list presented to : Spouse [place] ownership interest in [employer]provided to:: Spouse    Expected Discharge Plan and Services   Discharge Planning Services: CM Consult   Living arrangements for the past 2 months: Single Family Home                                      Prior Living Arrangements/Services Living arrangements for the past 2 months: Single Family Home Lives with:: Spouse              Current home services: DME, Other (comment) (Active [facility] 02, has rw,3n1,cane)    Activities of Daily Living   ADL Screening (condition at time of admission) Independently performs ADLs?: Yes (appropriate for developmental age) Is the patient deaf or have difficulty hearing?: No Does the patient have difficulty seeing, even when wearing glasses/contacts?: No Does the patient have difficulty concentrating, remembering, or making decisions?: No  Permission Sought/Granted                  Emotional Assessment              Admission diagnosis:  Shortness of breath [R06.02] Acute on chronic hypoxic respiratory failure (HCC) [J96.21] Patient Active Problem List   Diagnosis Date Noted   Acute on  chronic hypoxic respiratory failure (HCC) 09/13/2023   Lobar pneumonia (HCC) 08/29/2023   Severe sepsis (HCC) 08/29/2023   COPD (chronic obstructive pulmonary disease) (HCC) 08/29/2023   Palliative care patient 10/26/2022   Conjunctivitis 10/12/2022   Physical deconditioning 07/17/2022   Pharmacologic therapy 01/26/2021   Disorder of skeletal system 01/26/2021   Problems influencing health status 01/26/2021   ILD (interstitial lung disease) (HCC) 10/13/2019   Nodule of right lung 10/13/2019   Pneumonia due to COVID-19 virus 04/20/2019   PTSD (post-traumatic stress disorder) 07/27/2018   Memory loss 06/10/2018   Aortic atherosclerosis (HCC) 05/10/2018   Chronic respiratory failure with hypoxia (HCC) 04/26/2018   First degree AV block 04/15/2018   Hyponatremia 04/15/2018   Pleural effusion 04/14/2018   CAD (coronary artery disease) 04/09/2018   Chronic pain syndrome 04/09/2018   CKD (chronic kidney disease) stage 3, GFR 30-59 ml/min (HCC)    Dyspnea    HCAP (healthcare-associated pneumonia) 02/03/2018   Constipation 01/21/2018   (HFpEF) heart failure with preserved ejection fraction (HCC) 12/16/2017   Chest pain    Anticoagulated    Atrial flutter (HCC) 11/01/2017   Pain in left knee 07/15/2017   Bilateral foot pain 07/13/2017   Community acquired pneumonia 06/18/2017   Status post nephrectomy 06/09/2017   VTE (venous thromboembolism) 06/09/2017   History of  adenomatous polyp of colon 04/12/2017   Chronic pulmonary embolism (HCC) 10/23/2016   DDD (degenerative disc disease), cervical 11/13/2015   Essential tremor 08/14/2015   Gout 08/14/2015   History of fusion of cervical spine 04/24/2015   Hypothyroidism 07/12/2014   Hyperglycemia 07/12/2014   GAD (generalized anxiety disorder) 11/07/2013   OSA (obstructive sleep apnea) 07/07/2011   Asthmatic bronchitis 06/09/2011   Actinic keratosis 11/27/2009   History of total knee replacement, right 12/17/2008   Osteoarthritis  11/13/2008   History of prostate cancer-seed implantation 2010.  Dr. Laverle Patter 10/02/2008   History of renal cell carcinoma 12/26/2007   Insomnia 12/26/2007   TESTOSTERONE DEFICIENCY 12/28/2006   Essential hypertension 12/28/2006   History of total knee replacement, left 10/28/2006   PCP:  Shelva Majestic, MD Pharmacy:   [facility] [id number] - [address] [place] - [address] [place] [address] Phone: [phone] Fax: [phone]  [facility]) - [place], [place] - [address] [facility] [address] [place] [address] [place] [address] Phone: [phone] Fax: [phone]  [facility] [address] [place] [address] Phone: [phone] Fax: [phone]  [facility] [address] [place] [address] Phone: [phone] Fax: [phone]     Social Drivers of Health (SDOH) Social History: SDOH Screenings   Food Insecurity: No Food Insecurity (09/13/2023)  Housing: Low Risk  (09/13/2023)  Transportation Needs: No Transportation Needs (09/13/2023)  Utilities: Not At Risk (09/13/2023)  Depression (PHQ2-9): High Risk (02/11/2023)  Financial Resource Strain: Low Risk  (01/04/2023)  Physical Activity: Inactive (01/04/2023)  Social Connections: Moderately Integrated (09/13/2023)  Stress: No Stress Concern Present (01/04/2023)  Tobacco Use: Low Risk  (09/13/2023)   SDOH Interventions:     Readmission Risk Interventions     No data to display

## 2023-09-14 NOTE — Evaluation (Signed)
 Occupational Therapy Evaluation Patient Details Name: Wayne Booth MRN: [id number] DOB: [birth year] Today's Date: 09/14/2023   History of Present Illness   Patient is a 83year old male who presented on 3/17 with increased SOB. Patient is also noted to have recent d/c from hospital on 3/11 with multifocal CAP. Patient was admitted at this time with Acute on chronic hypoxic respiratory failure-in the setting of COPD, chronic hypoxia, and interstitial lung disease.  QIO:NGEX, interstitial lung disease, chronic hypoxia on 4-5 l nasal cannula oxygen at baseline     Clinical Impressions Patient is a 83 year old male who was admitted for above. Patient was living at home with wife support with independence in ADLs prior level. Patient was noted to be quick to fatigue during session with patient 98% or higher on 4L/min during session. Patient was noted to have decreased functional activity tolerance, decreased endurance, decreased standing balance, decreased safety awareness, and decreased knowledge of AD/AE impacting participation in ADLs. Plan is for patient to d/c home with [facility] and family support when medically stable.      If plan is discharge home, recommend the following:   A little help with walking and/or transfers;A little help with bathing/dressing/bathroom;Assistance with cooking/housework;Direct supervision/assist for medications management;Direct supervision/assist for financial management;Help with stairs or ramp for entrance;Assist for transportation     Functional Status Assessment   Patient has had a recent decline in their functional status and demonstrates the ability to make significant improvements in function in a reasonable and predictable amount of time.     Equipment Recommendations   None recommended by OT      Precautions/Restrictions   Precautions Precautions: Fall Precaution/Restrictions Comments: monitor O2 Restrictions Weight Bearing Restrictions Per  Provider Order: No     Mobility Bed Mobility Overal bed mobility: Needs Assistance Bed Mobility: Supine to Sit     Supine to sit: HOB elevated, Min assist     General bed mobility comments: with increased time and cues for breathing when patient was noted to hold breath to attempt to push up into sitting.          Balance Overall balance assessment: Needs assistance Sitting-balance support: No upper extremity supported Sitting balance-Leahy Scale: Good     Standing balance support: No upper extremity supported, Bilateral upper extremity supported Standing balance-Leahy Scale: Fair         ADL either performed or assessed with clinical judgement   ADL Overall ADL's : Needs assistance/impaired Eating/Feeding: Modified independent;Sitting   Grooming: Sitting;Set up   Upper Body Bathing: Set up;Sitting   Lower Body Bathing: Moderate assistance;Sitting/lateral leans   Upper Body Dressing : Set up;Contact guard assist;Sitting   Lower Body Dressing: Moderate assistance;Sit to/from stand;Sitting/lateral leans   Toilet Transfer: Minimal assistance Toilet Transfer Details (indicate cue type and reason): stand pivot with increaed time. O2 was 98% on 4L/min in room Toileting- Clothing Manipulation and Hygiene: Sit to/from stand;Minimal assistance                Pertinent Vitals/Pain Pain Assessment Pain Assessment: No/denies pain     Extremity/Trunk Assessment Upper Extremity Assessment Upper Extremity Assessment: Overall WFL for tasks assessed   Lower Extremity Assessment Lower Extremity Assessment: Defer to PT evaluation   Cervical / Trunk Assessment Cervical / Trunk Assessment: Kyphotic      Cognition Arousal: Alert Behavior During Therapy: WFL for tasks assessed/performed Cognition: No apparent impairments  Home Living Family/patient expects to be discharged to:: Private residence Living Arrangements:  Spouse/significant other Available Help at Discharge: Family;Available 24 hours/day Type of Home: House Home Access: Level entry     Home Layout: Multi-level;Able to live on main level with bedroom/bathroom Alternate Level Stairs-Number of Steps: 12   Bathroom Shower/Tub: Producer, television/film/video: Standard     Home Equipment: Grab bars - tub/shower;Shower Counsellor (2 wheels);Cane - single point   Additional Comments: Pt on 4-5 L O2 at home      Prior Functioning/Environment Prior Level of Function : Independent/Modified Independent;Driving               ADLs Comments: Independent with adls and light iadls    OT Problem List: Decreased activity tolerance;Impaired balance (sitting and/or standing);Decreased safety awareness;Cardiopulmonary status limiting activity   OT Treatment/Interventions: DME and/or AE instruction;Self-care/ADL training;Therapeutic activities;Patient/family education;Balance training      OT Goals(Current goals can be found in the care plan section)       OT Frequency:  Min 1X/week       AM-PAC OT "6 Clicks" Daily Activity     Outcome Measure Help from another person eating meals?: None Help from another person taking care of personal grooming?: None Help from another person toileting, which includes using toliet, bedpan, or urinal?: A Little Help from another person bathing (including washing, rinsing, drying)?: A Little Help from another person to put on and taking off regular upper body clothing?: A Little Help from another person to put on and taking off regular lower body clothing?: A Lot 6 Click Score: 19   End of Session Equipment Utilized During Treatment: Oxygen  Activity Tolerance: Patient tolerated treatment well Patient left: in chair;with call bell/phone within reach  OT Visit Diagnosis: Unsteadiness on feet (R26.81);Other abnormalities of gait and mobility (R26.89)                Time: 8315-1761 OT  Time Calculation (min): 15 min Charges:  OT General Charges $OT Visit: 1 Visit OT Evaluation $OT Eval Low Complexity: 1 Low  Pearlee Arvizu OTR/L, MS Acute Rehabilitation Department Office# [phone]   Selinda Flavin 09/14/2023, 2:29 PM

## 2023-09-14 NOTE — Progress Notes (Addendum)
 PROGRESS NOTE    Wayne Booth  JYN:[id number] DOB: 12-31-1940 DOA: 09/13/2023 PCP: Shelva Majestic, MD   Brief Narrative: Wayne Booth is a 83 y.o. male with a history of COPD, ILD, chronic respiratory failure on 4-5 L/min of oxygen.  Patient presented secondary to shortness of breath likely related to underlying lung disease.   Assessment and Plan:  Chronic respiratory failure with hypoxia Documented as having acute respiratory failure, but patient seems to be at baseline 4-5 L/min of oxygen. Patient does have some dyspnea slightly worse than baseline. Patient appears to be on chronic oxygen rate. Patient with a quick readmission secondary to respiratory symptoms. -Continue oxygen -PT/OT eval  COPD ILD Patchy airspace disease noted on chest x-ray but are persistent from prior x-rays and related to recent pneumonia. No antibiotics started. Patient is managed on Duonebs/albuterol and Pulmicort as an outpatient. It seems patient was recommended for hospice; patient is not aware of hospice. Palliative care consulted. -Continue Pulmicort and Duonebs -Continue Lasix -Palliative care recommendations pending  Peripheral neuropathy -Continue gabapentin TID  Depression Anxiety -Continue Lexapro -Resume Ativan    DVT prophylaxis: Lovenox Code Status:   Code Status: Full Code Family Communication: None at bedside Disposition Plan: Discharge home pending palliative care discussions and attempts to minimize readmission chance   Consultants:  Palliative care medicine  Procedures:  None  Antimicrobials: None    Subjective: Patient reports ongoing dyspnea but this appears to be near baseline.  Objective: BP 118/74 (BP Location: Right Arm)   Pulse 64   Temp 97.6 F (36.4 C)   Resp 18   Ht 5\' 11"  (1.803 m)   Wt 105.9 kg   SpO2 100%   BMI 32.55 kg/m   Examination:  General exam: Appears calm and comfortable Respiratory system: Diminished. Respiratory effort  normal. Cardiovascular system: S1 & S2 heard, RRR. Gastrointestinal system: Abdomen is nondistended, soft and nontender. Normal bowel sounds heard. Central nervous system: Alert and oriented. No focal neurological deficits. Psychiatry: Judgement and insight appear normal. Mood & affect appropriate.    Data Reviewed: I have personally reviewed following labs and imaging studies  CBC Lab Results  Component Value Date   WBC 8.2 09/13/2023   RBC 5.22 09/13/2023   HGB 15.6 09/13/2023   HCT 48.1 09/13/2023   MCV 92.1 09/13/2023   MCH 29.9 09/13/2023   PLT 239 09/13/2023   MCHC 32.4 09/13/2023   RDW 14.7 09/13/2023   LYMPHSABS 0.8 09/13/2023   MONOABS 0.5 09/13/2023   EOSABS 0.1 09/13/2023   BASOSABS 0.0 09/13/2023     Last metabolic panel Lab Results  Component Value Date   NA 135 09/13/2023   K 3.8 09/13/2023   CL 104 09/13/2023   CO2 25 09/13/2023   BUN 14 09/13/2023   CREATININE 0.83 09/13/2023   GLUCOSE 108 (H) 09/13/2023   GFRNONAA >60 09/13/2023   GFRAA 72 07/12/2019   CALCIUM 8.0 (L) 09/13/2023   PHOS 5.4 (H) 02/23/2018   PROT 6.8 09/13/2023   ALBUMIN 2.6 (L) 09/13/2023   BILITOT 0.8 09/13/2023   ALKPHOS 44 09/13/2023   AST 26 09/13/2023   ALT 33 09/13/2023   ANIONGAP 6 09/13/2023    GFR: Estimated Creatinine Clearance: 86.4 mL/min (by C-G formula based on SCr of 0.83 mg/dL).  No results found for this or any previous visit (from the past 240 hours).    Radiology Studies: DG Chest Port 1 View Result Date: 09/13/2023 CLINICAL DATA:  sob EXAM: PORTABLE CHEST  1 VIEW COMPARISON:  Chest x-ray 08/29/2023, CT chest 08/29/2023 FINDINGS: More prominent mediastinum due to AP portable technique. The heart and mediastinal contours are unchanged. Atherosclerotic plaque. Right apex collimated off view due to overlying mandible. Low lung volumes. Persistent bilateral patchy airspace opacities, more prominent throughout the right lung and at the left lower lung zone. At  least bilateral trace to small volume pleural effusions. No pneumothorax. No acute osseous abnormality. IMPRESSION: 1. Low lung volumes with persistent bilateral patchy airspace opacities. 2.  At least bilateral trace to small volume pleural effusions. Electronically Signed   By: Tish Frederickson M.D.   On: 09/13/2023 04:56      LOS: 1 day    Jacquelin Hawking, MD [facility] 09/14/2023, 8:05 AM   If 7PM-7AM, please contact night-coverage [email or web address]

## 2023-09-14 NOTE — Hospital Course (Signed)
 Wayne Booth is a 83 y.o. male with a history of COPD, ILD, chronic respiratory failure on 4-5 L/min of oxygen.  Patient presented secondary to shortness of breath likely related to underlying lung disease.

## 2023-09-14 NOTE — Consult Note (Signed)
 Consultation Note Date: 09/14/2023   Patient Name: Wayne Booth  DOB: March 28, 1941  MRN: [id number]  Age / Sex: 83 y.o., male  PCP: Shelva Majestic, MD Referring Physician: Narda Bonds, MD  Reason for Consultation: Establishing goals of care  HPI/Patient Profile: 83 y.o. male  with past medical history of COPD, chronic hypoxia interstitial lung disease admitted on 09/13/2023 with increased shortness of breath, recently discharged from the hospital on 3-11 with multifocal community-acquired pneumonia..   Clinical Assessment and Goals of Care: 83 year old gentleman who lives at home with his wife.  He follows with Dr. Vassie Loll from pulmonary services for COPD and interstitial lung disease however has not had a recent appointment.  Patient was recently hospitalized for community-acquired pneumonia.  Patient's baseline is such that he is chronically hypoxic and requires 4 to 5 L of oxygen via nasal cannula on the pretty much continuous basis. Patient admitted with increased work of breathing and remains admitted to hospital medicine service. Palliative consult for CODE STATUS and broad goals of care discussions has been requested. Chart reviewed, patient seen and examined.  NEXT OF KIN Lives at home with his wife, has a son.  SUMMARY OF RECOMMENDATIONS   CODE STATUS and goals of care discussions: Explained to the patient to the best of my ability about the serious underlying nature of his illness chronic hypoxia interstitial lung disease and COPD.  Discussed about recent hospitalization for pneumonia.  Patient has been having gradual progressive functional decline, he is essentially chronically oxygen dependent on 4-5 L.  Explained about CODE STATUS of full code versus DNR/DNI.  Patient does not believe he has prepared advance care planning documents in the past 2.  He wishes to continue with full code full  scope.  He is eager to participate in PT OT, he is not opposed to having home-based PT upon discharge.  He states he will follow-up with pulmonary services in the outpatient setting to get a better idea about his prognosis regarding his lung disease going forward.  Offered active listening and supportive empathic presence and other therapeutic communication techniques were employed at the time of this initial palliative encounter.  Call placed but unable to reach [facility] wife Jody at this time, call goes straight to voicemail.  Palliative will reattempt on 09-15-2023, otherwise continue current mode of care.  Of note, patient was also supposed to have outpatient palliative services [facility] Code Status/Advance Care Planning: Full code   Symptom Management:     Palliative Prophylaxis:  Delirium Protocol  Additional Recommendations (Limitations, Scope, Preferences): Full Scope Treatment  Psycho-social/Spiritual:  Desire for further Chaplaincy support:yes Additional Recommendations: Caregiving  Support/Resources  Prognosis:  Unable to determine  Discharge Planning: To Be Determined      Primary Diagnoses: Present on Admission:  Acute on chronic hypoxic respiratory failure (HCC)   I have reviewed the medical record, interviewed the patient and family, and examined the patient. The following aspects are pertinent.  Past Medical History:  Diagnosis Date   Allergy  States his nose runs when he is around grease.    Anxiety    Arthritis    Atrial fibrillation (HCC)    Cancer (HCC)    Coronary artery disease    History of total knee replacement    Hypertension    Hypothyroidism    Kidney cancer, primary, with metastasis from kidney to other site [facility])    Kidney stone    OSA (obstructive sleep apnea)    pt does not wear cpap at night   Pituitary cyst (HCC)    Pneumonia    hx of   Prostate cancer (HCC)    Social History   Socioeconomic History   Marital status: Married     Spouse name: Randa Evens   Number of children: Y   Years of education: Not on file   Highest education level: Not on file  Occupational History   Occupation: retired Engineer, site: RETIRED  Tobacco Use   Smoking status: Never   Smokeless tobacco: Never  Vaping Use   Vaping status: Never Used  Substance and Sexual Activity   Alcohol use: Yes    Alcohol/week: 2.0 standard drinks of alcohol    Types: 2 Shots of liquor per week    Comment: socially   Drug use: No   Sexual activity: Yes  Other Topics Concern   Not on file  Social History Narrative   Married 1965 (wife patient of Dr. Orvan Falconer). 2 sons. 4 granddaughters.       Retired Investment banker, corporate.       Hobbies: golf, fishing, formerly hunting.    Social Drivers of Corporate investment banker Strain: Low Risk  (01/04/2023)   Overall Financial Resource Strain (CARDIA)    Difficulty of Paying Living Expenses: Not hard at all  Food Insecurity: No Food Insecurity (09/13/2023)   Hunger Vital Sign    Worried About Running Out of Food in the Last Year: Never true    Ran Out of Food in the Last Year: Never true  Transportation Needs: No Transportation Needs (09/13/2023)   PRAPARE - Administrator, Civil Service (Medical): No    Lack of Transportation (Non-Medical): No  Physical Activity: Inactive (01/04/2023)   Exercise Vital Sign    Days of Exercise per Week: 0 days    Minutes of Exercise per Session: 0 min  Stress: No Stress Concern Present (01/04/2023)   [employer] of Occupational Health - Occupational Stress Questionnaire    Feeling of Stress : Not at all  Social Connections: Moderately Integrated (09/13/2023)   Social Connection and Isolation Panel [NHANES]    Frequency of Communication with Friends and Family: More than three times a week    Frequency of Social Gatherings with Friends and Family: More than three times a week    Attends Religious Services: 1 to 4 times per year     Active Member of [employer] or Organizations: No    Attends Banker Meetings: Never    Marital Status: Married   Family History  Problem Relation Age of Onset   Cancer Mother        stomach, died when he was young   Cancer Father        [place], died before patient born   Kidney cancer Sister    Colon cancer Brother 36   Gastric cancer Son 50       stomach cancer per pt   Esophageal cancer Neg Hx  Rectal cancer Neg Hx    Stomach cancer Neg Hx    Scheduled Meds:  budesonide  0.25 mg Nebulization BID   enoxaparin (LOVENOX) injection  40 mg Subcutaneous QHS   escitalopram  20 mg Oral Daily   furosemide  20 mg Oral Daily   gabapentin  300 mg Oral TID   [START ON 09/15/2023] predniSONE  5 mg Oral Q breakfast   Continuous Infusions: PRN Meds:.acetaminophen **OR** acetaminophen, ipratropium-albuterol, ondansetron **OR** ondansetron (ZOFRAN) IV Medications Prior to Admission:  Prior to Admission medications   Medication Sig Start Date End Date Taking? Authorizing Provider  albuterol (PROVENTIL) (2.5 MG/3ML) 0.083% nebulizer solution Take 3 mLs (2.5 mg total) by nebulization every 6 (six) hours as needed for wheezing or shortness of breath. 10/12/22  Yes Parrett, Tammy S, NP  albuterol (VENTOLIN HFA) 108 (90 Base) MCG/ACT inhaler INHALE 2 PUFFS BY MOUTH EVERY 6 HOURS AS NEEDED FOR WHEEZING OR SHORTNESS OF BREATH Patient taking differently: Inhale 2 puffs into the lungs every 6 (six) hours as needed for wheezing or shortness of breath. 07/13/23  Yes Parrett, Tammy S, NP  budesonide (PULMICORT) 0.25 MG/2ML nebulizer solution Take 2 mLs (0.25 mg total) by nebulization 2 (two) times daily. Patient taking differently: Take 0.25 mg by nebulization as needed (wheezing and SOB). 09/07/23  Yes Burnadette Pop, MD  escitalopram (LEXAPRO) 20 MG tablet TAKE 1 TABLET BY MOUTH DAILY 06/07/23  Yes Shelva Majestic, MD  furosemide (LASIX) 20 MG tablet Take 1 tablet (20 mg total) by mouth  daily. Patient taking differently: Take 20 mg by mouth daily as needed for fluid or edema. 09/08/23  Yes Burnadette Pop, MD  gabapentin (NEURONTIN) 300 MG capsule Take 1 capsule (300 mg total) by mouth 3 (three) times daily. 12/25/22  Yes Shelva Majestic, MD  ipratropium-albuterol (DUONEB) 0.5-2.5 (3) MG/3ML SOLN Take 3 mLs by nebulization every 6 (six) hours as needed. Patient taking differently: Take 3 mLs by nebulization every 6 (six) hours as needed (wheezing/ SOB). 09/07/23  Yes Adhikari, Willia Craze, MD  LORazepam (ATIVAN) 0.5 MG tablet Take 1 tablet (0.5 mg total) by mouth daily as needed for anxiety (do not take within 8 hours of bed. do not drive for 8 hours after taking.). 04/30/23  Yes Shelva Majestic, MD  predniSONE (DELTASONE) 5 MG tablet Take 6 tablets (30 mg total) by mouth daily with breakfast for 1 day, THEN 4 tablets (20 mg total) daily with breakfast for 3 days, THEN 2 tablets (10 mg total) daily with breakfast for 3 days, THEN 1 tablet (5 mg total) daily with breakfast for 3 days. 09/08/23 09/18/23 Yes Burnadette Pop, MD  traMADol (ULTRAM) 50 MG tablet Take 1 tablet (50 mg total) by mouth every 6 (six) hours as needed (prefer max 3 per day). Patient taking differently: Take 50 mg by mouth every 6 (six) hours as needed for moderate pain (pain score 4-6) or severe pain (pain score 7-10) (prefer max 3 per day). 01/30/22  Yes Ardith Dark, MD  traZODone (DESYREL) 50 MG tablet Take 0.5-1 tablets (25-50 mg total) by mouth at bedtime as needed for sleep. Patient taking differently: Take 50 mg by mouth at bedtime. 12/25/22  Yes Shelva Majestic, MD  busPIRone (BUSPAR) 5 MG tablet TAKE ONE TABLET BY MOUTH TWICE A DAY AS NEEDED FOR ANXIETY Patient not taking: Reported on 09/13/2023 01/18/23   Shelva Majestic, MD   Allergies  Allergen Reactions   Ambien [Zolpidem] Anxiety    Anxiety  and agitation when tried as sleeper in hospital aug 2019   Review of Systems Generalized weakness Physical  Exam Has some degree of shortness of breath even at rest 4-5 L oxygen nasal cannula Awake alert Mild respiratory distress S1-S2  Vital Signs: BP 118/72 (BP Location: Left Arm)   Pulse 68   Temp 97.9 F (36.6 C) (Oral)   Resp 18   Ht 5\' 11"  (1.803 m)   Wt 105.9 kg   SpO2 99%   BMI 32.55 kg/m  Pain Scale: 0-10   Pain Score: 0-No pain   SpO2: SpO2: 99 % O2 Device:SpO2: 99 % O2 Flow Rate: .O2 Flow Rate (L/min): 4 L/min (4 L [place], (home 02))  IO: Intake/output summary:  Intake/Output Summary (Last 24 hours) at 09/14/2023 1521 Last data filed at 09/14/2023 1191 Gross per 24 hour  Intake 556 ml  Output 1300 ml  Net -744 ml    LBM: Last BM Date : 09/13/23 Baseline Weight: Weight: 105 kg Most recent weight: Weight: 105.9 kg     Palliative Assessment/Data:   PPS 50%  Time In:  1400 Time Out:  1500 Time Total:  60  Greater than 50%  of this time was spent counseling and coordinating care related to the above assessment and plan.  Signed by: Rosalin Hawking, MD   Please contact Palliative Medicine Team phone at [phone] for questions and concerns.  For individual provider: See Loretha Stapler

## 2023-09-14 NOTE — Evaluation (Signed)
 Physical Therapy Evaluation Patient Details Name: Wayne Booth MRN: [id number] DOB: 1941-02-09 Today's Date: 09/14/2023  History of Present Illness  Patient is a 83year old male who presented on 3/17 with increased SOB. Patient is also noted to have recent d/c from hospital on 3/11 with multifocal CAP. Patient was admitted at this time with Acute on chronic hypoxic respiratory failure-in the setting of COPD, chronic hypoxia, and interstitial lung disease.  YIF:OYDX, interstitial lung disease, chronic hypoxia on 4-5 l nasal cannula oxygen at baseline  Clinical Impression   Pt admitted with above diagnosis.  Pt currently with functional limitations due to the deficits listed below (see PT Problem List). Pt seated in recliner when PT arrived. Pt agreeable to therapy intervention. Pt on 4 L/min throughout evaluation. Son present. Pt is motivated to return to personal home and agreeable to [facility] services. Pt required CGA and min cues for sit to stand  from recliner to RW, CGA for gait tasks 80 feet with RW and cues, pt desaturated to 86% on 4 L/min with exertion. Pt elected to return to recliner and all needs in place. Pt will benefit from acute skilled PT to increase their independence and safety with mobility to allow discharge.         If plan is discharge home, recommend the following: A little help with walking and/or transfers;A little help with bathing/dressing/bathroom;Assistance with cooking/housework;Help with stairs or ramp for entrance;Assist for transportation   Can travel by private vehicle        Equipment Recommendations None recommended by PT  Recommendations for Other Services       Functional Status Assessment Patient has had a recent decline in their functional status and demonstrates the ability to make significant improvements in function in a reasonable and predictable amount of time.     Precautions / Restrictions Precautions Precautions: Fall Precaution/Restrictions  Comments: monitor O2 Restrictions Weight Bearing Restrictions Per Provider Order: No      Mobility  Bed Mobility               General bed mobility comments: pt seated in recliner when PT arrived    Transfers Overall transfer level: Needs assistance Equipment used: Rolling walker (2 wheels) Transfers: Sit to/from Stand Sit to Stand: Contact guard assist           General transfer comment: min cues for safety and proper UE and AD placement    Ambulation/Gait Ambulation/Gait assistance: Contact guard assist Gait Distance (Feet): 80 Feet Assistive device: Rolling walker (2 wheels) Gait Pattern/deviations: Step-to pattern, Decreased stride length, Trunk flexed Gait velocity: decreased     General Gait Details: cues for posture, proper distance from RW and coordinated breathing. pt on 4 L/min during evaluation pt desaturated to 86% with supplemental O2 and required seated theraputic rest break with coaching for pursed lip breathing and 1:07 to recover to 90%  Stairs            Wheelchair Mobility     Tilt Bed    Modified Rankin (Stroke Patients Only)       Balance Overall balance assessment: Needs assistance Sitting-balance support: No upper extremity supported Sitting balance-Leahy Scale: Good     Standing balance support: No upper extremity supported, Bilateral upper extremity supported Standing balance-Leahy Scale: Fair Standing balance comment: RW to ambulate but able to stand without AD  Pertinent Vitals/Pain Pain Assessment Pain Assessment: No/denies pain    Home Living Family/patient expects to be discharged to:: Private residence Living Arrangements: Spouse/significant other Available Help at Discharge: Family;Available 24 hours/day Type of Home: House Home Access: Level entry     Alternate Level Stairs-Number of Steps: 12 Home Layout: Multi-level;Able to live on main level with  bedroom/bathroom Home Equipment: Grab bars - tub/shower;Shower Counsellor (2 wheels);Cane - single point Additional Comments: Pt on 4-5 L O2 at home    Prior Function Prior Level of Function : Independent/Modified Independent;Driving             Mobility Comments: Walked without AD; mainly just limited community distances ADLs Comments: Independent with adls and light iadls     Extremity/Trunk Assessment   Upper Extremity Assessment Upper Extremity Assessment: Defer to OT evaluation    Lower Extremity Assessment Lower Extremity Assessment: Overall WFL for tasks assessed    Cervical / Trunk Assessment Cervical / Trunk Assessment: Kyphotic  Communication   Communication Communication: No apparent difficulties    Cognition Arousal: Alert Behavior During Therapy: WFL for tasks assessed/performed   PT - Cognitive impairments: Memory                       PT - Cognition Comments: Needs cues for rollator Following commands: Intact       Cueing       General Comments General comments (skin integrity, edema, etc.): 4 L/min    Exercises     Assessment/Plan    PT Assessment Patient needs continued PT services  PT Problem List Decreased strength;Cardiopulmonary status limiting activity;Decreased activity tolerance;Decreased balance;Decreased mobility;Decreased knowledge of use of DME       PT Treatment Interventions DME instruction;Therapeutic exercise;Gait training;Functional mobility training;Therapeutic activities;Patient/family education;Balance training    PT Goals (Current goals can be found in the Care Plan section)  Acute Rehab PT Goals Patient Stated Goal: return home and to normal PT Goal Formulation: With patient Time For Goal Achievement: 10/06/23 Potential to Achieve Goals: Good    Frequency Min 2X/week     Co-evaluation               AM-PAC PT "6 Clicks" Mobility  Outcome Measure Help needed turning from your back to  your side while in a flat bed without using bedrails?: A Little Help needed moving from lying on your back to sitting on the side of a flat bed without using bedrails?: A Little Help needed moving to and from a bed to a chair (including a wheelchair)?: A Little Help needed standing up from a chair using your arms (e.g., wheelchair or bedside chair)?: A Little Help needed to walk in hospital room?: A Little Help needed climbing 3-5 steps with a railing? : A Lot 6 Click Score: 17    End of Session Equipment Utilized During Treatment: Gait belt;Oxygen Activity Tolerance: Patient tolerated treatment well Patient left: in chair;with call bell/phone within reach;with family/visitor present Nurse Communication: Mobility status PT Visit Diagnosis: Other abnormalities of gait and mobility (R26.89);Muscle weakness (generalized) (M62.81)    Time: 1610-9604 PT Time Calculation (min) (ACUTE ONLY): 14 min   Charges:   PT Evaluation $PT Eval Low Complexity: 1 Low   PT General Charges $$ ACUTE PT VISIT: 1 Visit         Johnny Bridge, PT Acute Rehab   Jacqualyn Posey 09/14/2023, 5:56 PM

## 2023-09-15 DIAGNOSIS — J9621 Acute and chronic respiratory failure with hypoxia: Secondary | ICD-10-CM | POA: Diagnosis not present

## 2023-09-15 MED ORDER — SENNOSIDES-DOCUSATE SODIUM 8.6-50 MG PO TABS
1.0000 | ORAL_TABLET | Freq: Two times a day (BID) | ORAL | Status: DC
  Administered 2023-09-15 – 2023-09-17 (×5): 1 via ORAL
  Filled 2023-09-15 (×5): qty 1

## 2023-09-15 NOTE — Plan of Care (Signed)

## 2023-09-15 NOTE — Progress Notes (Signed)
 [facility]- [facility] Liaison Note: Notified by [facility] manager of patient/family request for [facility] Palliative services at home after discharge. Please call with any hospice or outpatient palliative care related questions. Thank you for the opportunity to participate in this patient's care. Henderson Newcomer, LPN [facility] Liaison [phone]

## 2023-09-15 NOTE — Progress Notes (Signed)
 PROGRESS NOTE    Wayne Booth  ZOX:[id number] DOB: [birth year] DOA: 09/13/2023 PCP: Shelva Majestic, MD    Brief Narrative: 83 year old male with history of interstitial lung disease, obstructive sleep apnea, COPD chronic hypoxic respiratory failure on 4 to 5 L of oxygen admitted with gradually worsening shortness of breath  Patient was recently admitted to the hospital on 08/29/2023 and discharged 09/07/2023 with sepsis/pneumonia he was DNR comfort care at that time currently he is full code and wants to remain full code. Chest x-ray this admission low lung volumes with persistent bilateral patchy airspace opacities trace small pleural effusions bilateral Followed by Dr. Vassie Loll Assessment & Plan:   Principal Problem:   Acute on chronic hypoxic respiratory failure (HCC)   Chronic respiratory failure with hypoxia-patient has chronic hypoxic respiratory failure on 4 to 5 L of oxygen at home.  He is very hesitant to going home today.  He feels he is very weak and his wife will not be able to handle him at home.  At the same time he declined PT today.  Patient was recently admitted to the hospital discharged on March 11 and readmitted on the 17th. Continue prednisone  COPD/interstitial lung disease chest x-ray this admission with bilateral patchy infiltrates.  He was recently treated with antibiotics as above.  Patient requesting hospice services on discharge.  Continue inhalers/nebulizer Pulmicort DuoNeb and Lasix.   Peripheral neuropathy -Continue gabapentin TID   Depression Anxiety -Continue Lexapro, BuSpar -Resume Ativan   Estimated body mass index is 32.55 kg/m as calculated from the following:   Height as of this encounter: 5\' 11"  (1.803 m).   Weight as of this encounter: 105.9 kg.  DVT prophylaxis: Lovenox Code Status:   Code Status: Full Code Family Communication: None at bedside Disposition Plan: Discharge home pending palliative care discussions and attempts to minimize  readmission chance   Consultants:  Palliative care medicine   Procedures:  None   Antimicrobials: None   Subjective:  He is sitting up in chair reports he just does not feel good today  He does not want to go home he is afraid he will come back right away No overnight events were noted   objective: Vitals:   09/14/23 2041 09/15/23 0655 09/15/23 0841 09/15/23 1246  BP:  119/65  124/74  Pulse:  74  79  Resp:  20  18  Temp:  98.7 F (37.1 C)  97.7 F (36.5 C)  TempSrc:  Oral  Oral  SpO2: 95% 97% 96% 96%  Weight:      Height:        Intake/Output Summary (Last 24 hours) at 09/15/2023 1430 Last data filed at 09/15/2023 1225 Gross per 24 hour  Intake 357 ml  Output 1700 ml  Net -1343 ml   Filed Weights   09/13/23 1347 09/13/23 2200  Weight: 105 kg 105.9 kg    Examination:  General exam: Appears in no acute distress Respiratory system: Scattered rhonchi bilaterally Cardiovascular system: S1 & S2 heard, RRR. No JVD, murmurs, rubs, gallops or clicks. No pedal edema. Gastrointestinal system: Abdomen is distended, soft and nontender. No organomegaly or masses felt. Normal bowel sounds heard. Central nervous system: Alert and oriented. No focal neurological deficits. Extremities: 1+ bilateral pitting edema  Data Reviewed: I have personally reviewed following labs and imaging studies  CBC: Recent Labs  Lab 09/13/23 0515 09/14/23 0759  WBC 8.2 9.8  NEUTROABS 6.8  --   HGB 15.6 15.4  HCT 48.1 48.0  MCV 92.1 92.8  PLT 239 257   Basic Metabolic Panel: Recent Labs  Lab 09/13/23 0515 09/14/23 0759  NA 135 136  K 3.8 4.4  CL 104 102  CO2 25 28  GLUCOSE 108* 104*  BUN 14 20  CREATININE 0.83 0.91  CALCIUM 8.0* 8.3*   GFR: Estimated Creatinine Clearance: 78.8 mL/min (by C-G formula based on SCr of 0.91 mg/dL). Liver Function Tests: Recent Labs  Lab 09/13/23 0515  AST 26  ALT 33  ALKPHOS 44  BILITOT 0.8  PROT 6.8  ALBUMIN 2.6*   No results for  input(s): "LIPASE", "AMYLASE" in the last 168 hours. No results for input(s): "AMMONIA" in the last 168 hours. Coagulation Profile: No results for input(s): "INR", "PROTIME" in the last 168 hours. Cardiac Enzymes: No results for input(s): "CKTOTAL", "CKMB", "CKMBINDEX", "TROPONINI" in the last 168 hours. BNP (last 3 results) No results for input(s): "PROBNP" in the last 8760 hours. HbA1C: No results for input(s): "HGBA1C" in the last 72 hours. CBG: No results for input(s): "GLUCAP" in the last 168 hours. Lipid Profile: No results for input(s): "CHOL", "HDL", "LDLCALC", "TRIG", "CHOLHDL", "LDLDIRECT" in the last 72 hours. Thyroid Function Tests: No results for input(s): "TSH", "T4TOTAL", "FREET4", "T3FREE", "THYROIDAB" in the last 72 hours. Anemia Panel: No results for input(s): "VITAMINB12", "FOLATE", "FERRITIN", "TIBC", "IRON", "RETICCTPCT" in the last 72 hours. Sepsis Labs: Recent Labs  Lab 09/13/23 0752  LATICACIDVEN 1.0   No results found for this or any previous visit (from the past 240 hours).   Radiology Studies: No results found.  Scheduled Meds:  budesonide  0.25 mg Nebulization BID   enoxaparin (LOVENOX) injection  40 mg Subcutaneous QHS   escitalopram  20 mg Oral Daily   furosemide  20 mg Oral Daily   gabapentin  300 mg Oral TID   predniSONE  5 mg Oral Q breakfast   senna-docusate  1 tablet Oral BID   Continuous Infusions:   LOS: 1 day   Alwyn Ren, MD  09/15/2023, 2:30 PM

## 2023-09-15 NOTE — Progress Notes (Signed)
 PT Cancellation Note  Patient Details Name: Wayne Booth MRN: [id number] DOB: 1940/09/17   Cancelled Treatment:    Reason Eval/Treat Not Completed: Other (comment). Pt in bed this am and declined therapy services at this time. PT to return later in day if schedule allows. PT to continue to follow acutely.   Johnny Bridge, PT Acute Rehab   Jacqualyn Posey 09/15/2023, 10:51 AM

## 2023-09-15 NOTE — Progress Notes (Signed)
 Daily Progress Note   Patient Name: Wayne Booth       Date: 09/15/2023 DOB: 03/07/1941  Age: 83 y.o. MRN#: [id number] Attending Physician: Alwyn Ren, MD Primary Care Physician: Shelva Majestic, MD Admit Date: 09/13/2023  Reason for Consultation/Follow-up: Establishing goals of care  Subjective: Patient is awake alert resting in bed.  He states that he simply does not feel well today.  He is trying his best to participate with PT OT.  He states that he might be constipated.  Discussed about bowel regimen.  Will add Senokot-S and monitor.  Otherwise, wants to remain full code full scope and wants to discuss with his outpatient regular pulmonologist about the extent of his COPD as well as his interstitial lung disease and his chronic hypoxia before he makes any further goals of care.  Length of Stay: 1  Current Medications: Scheduled Meds:   budesonide  0.25 mg Nebulization BID   enoxaparin (LOVENOX) injection  40 mg Subcutaneous QHS   escitalopram  20 mg Oral Daily   furosemide  20 mg Oral Daily   gabapentin  300 mg Oral TID   predniSONE  5 mg Oral Q breakfast   senna-docusate  1 tablet Oral BID    Continuous Infusions:   PRN Meds: acetaminophen **OR** acetaminophen, ipratropium-albuterol, LORazepam, ondansetron **OR** ondansetron (ZOFRAN) IV  Physical Exam         Awake alert Resting in bed Complains of abdominal discomfort believes that he might be constipated Regular work of breathing  Vital Signs: BP 124/74 (BP Location: Right Arm)   Pulse 79   Temp 97.7 F (36.5 C) (Oral)   Resp 18   Ht 5\' 11"  (1.803 m)   Wt 105.9 kg   SpO2 96%   BMI 32.55 kg/m  SpO2: SpO2: 96 % O2 Device: O2 Device: Nasal Cannula O2 Flow Rate: O2 Flow Rate (L/min): 3  L/min  Intake/output summary:  Intake/Output Summary (Last 24 hours) at 09/15/2023 1402 Last data filed at 09/15/2023 1225 Gross per 24 hour  Intake 357 ml  Output 1700 ml  Net -1343 ml   LBM: Last BM Date : 09/14/23 Baseline Weight: Weight: 105 kg Most recent weight: Weight: 105.9 kg       Palliative Assessment/Data:      Patient Active Problem List  Diagnosis Date Noted   Acute on chronic hypoxic respiratory failure (HCC) 09/13/2023   Lobar pneumonia (HCC) 08/29/2023   Severe sepsis (HCC) 08/29/2023   COPD (chronic obstructive pulmonary disease) (HCC) 08/29/2023   Palliative care patient 10/26/2022   Conjunctivitis 10/12/2022   Physical deconditioning 07/17/2022   Pharmacologic therapy 01/26/2021   Disorder of skeletal system 01/26/2021   Problems influencing health status 01/26/2021   ILD (interstitial lung disease) (HCC) 10/13/2019   Nodule of right lung 10/13/2019   Pneumonia due to COVID-19 virus 04/20/2019   PTSD (post-traumatic stress disorder) 07/27/2018   Memory loss 06/10/2018   Aortic atherosclerosis (HCC) 05/10/2018   Chronic respiratory failure with hypoxia (HCC) 04/26/2018   First degree AV block 04/15/2018   Hyponatremia 04/15/2018   Pleural effusion 04/14/2018   CAD (coronary artery disease) 04/09/2018   Chronic pain syndrome 04/09/2018   CKD (chronic kidney disease) stage 3, GFR 30-59 ml/min (HCC)    Dyspnea    HCAP (healthcare-associated pneumonia) 02/03/2018   Constipation 01/21/2018   (HFpEF) heart failure with preserved ejection fraction (HCC) 12/16/2017   Chest pain    Anticoagulated    Atrial flutter (HCC) 11/01/2017   Pain in left knee 07/15/2017   Bilateral foot pain 07/13/2017   Community acquired pneumonia 06/18/2017   Status post nephrectomy 06/09/2017   VTE (venous thromboembolism) 06/09/2017   History of adenomatous polyp of colon 04/12/2017   Chronic pulmonary embolism (HCC) 10/23/2016   DDD (degenerative disc disease),  cervical 11/13/2015   Essential tremor 08/14/2015   Gout 08/14/2015   History of fusion of cervical spine 04/24/2015   Hypothyroidism 07/12/2014   Hyperglycemia 07/12/2014   GAD (generalized anxiety disorder) 11/07/2013   OSA (obstructive sleep apnea) 07/07/2011   Asthmatic bronchitis 06/09/2011   Actinic keratosis 11/27/2009   History of total knee replacement, 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  Dr. Laverle Patter 10/02/2008   History of renal cell carcinoma 12/26/2007   Insomnia 12/26/2007   TESTOSTERONE DEFICIENCY 12/28/2006   Essential hypertension 12/28/2006   History of total knee replacement, left 10/28/2006    Palliative Care Assessment & Plan   Patient Profile:    Assessment:  83 year old gentleman who lives at home with his wife.  He follows with Dr. Vassie Loll from pulmonary services for COPD and interstitial lung disease however has not had a recent appointment.  Patient was recently hospitalized for community-acquired pneumonia.  Patient's baseline is such that he is chronically hypoxic and requires 4 to 5 L of oxygen via nasal cannula on the pretty much continuous basis. Patient admitted with increased work of breathing and remains admitted to hospital medicine service. Palliative consult for CODE STATUS and broad goals of care discussions has been requested. Chart reviewed, patient seen and examined.  Recommendations/Plan: Add bowel regimen Full code full scope Continue current mode of care To be set up with outpatient palliative services.  Patient wishes to discuss further with his pulmonologist in the outpatient setting before deciding on further goals of care.   Goals of Care and Additional Recommendations: Limitations on Scope of Treatment: Full Scope Treatment  Code Status:    Code Status Orders  (From admission, onward)           Start     Ordered   09/13/23 0836  Full code  Continuous        Question:  By:  Answer:  Consent: discussion documented in EHR   09/13/23 1610  Code Status History     Date Active Date Inactive Code Status Order ID Comments User Context   08/29/2023 0937 09/07/2023 2000 Full Code [id number]  Standley Brooking, MD ED   07/14/2019 1044 07/14/2019 2242 Full Code [id number]  Corky Crafts, MD Inpatient   04/23/2019 0740 04/28/2019 1631 Full Code [id number]  Rometta Emery, MD Inpatient   04/15/2018 0056 04/20/2018 1620 Full Code [id number]  John Giovanni, MD Inpatient   04/09/2018 0125 04/11/2018 1639 Full Code [id number]  John Giovanni, MD Inpatient   02/28/2018 0827 03/05/2018 1248 Full Code [id number]  Jacquelynn Cree, PA-C Inpatient   02/26/2018 1340 02/28/2018 0827 DNR [id number]  Charlton Amor, PA-C Inpatient   02/26/2018 1340 02/26/2018 1340 Full Code [id number]  Charlton Amor, PA-C Inpatient   02/21/2018 1107 02/26/2018 1329 DNR [id number]  Alyson Reedy, MD Inpatient   02/03/2018 0007 02/21/2018 1107 Full Code [id number]  Eduard Clos, MD ED   01/28/2018 1655 01/29/2018 1446 Full Code [id number]  Regan Lemming, MD Inpatient   12/03/2017 1948 12/08/2017 1742 Full Code [id number]  Eddie North, MD Inpatient   11/01/2017 2023 11/04/2017 0154 Full Code [id number]  Eduard Clos, MD Inpatient   04/24/2015 1218 04/26/2015 1126 Full Code [id number]  Tia Alert, MD Inpatient       Prognosis:  Unable to determine  Discharge Planning: Home with Palliative Services  Care plan was discussed with patient  Thank you for allowing the Palliative Medicine Team to assist in the care of this patient. Mod MDM     Greater than 50%  of this time was spent counseling and coordinating care related to the above assessment and plan.  Rosalin Hawking, MD  Please contact Palliative Medicine Team phone at [phone] for questions and concerns.

## 2023-09-15 NOTE — TOC Progression Note (Signed)
 Transition of Care [facility]) - Progression Note    Patient Details  Name: Wayne Booth MRN: [id number] Date of Birth: 1941/05/31  Transition of Care [facility]) CM/SW Contact  Treniyah Lynn, Olegario Messier, RN Phone Number: 09/15/2023, 9:42 AM  Clinical Narrative:Spoke to spouse(Jody)No preference for HHC-[facility] HHPT/OT rep Cherly Hensen Palliative care services-[facility] care rep Melissa. Has own transport home.       Expected Discharge Plan: Home w Home Health Services Barriers to Discharge: Continued Medical Work up  Expected Discharge Plan and Services   Discharge Planning Services: CM Consult Post Acute Care Choice: Home Health Living arrangements for the past 2 months: Single Family Home                           HH Arranged: PT, OT HH Agency: [facility] Date HH Agency Contacted: 09/15/23 Time HH Agency Contacted: [phone] Representative spoke with at [facility] Agency: Marylene Land   Social Determinants of Health (SDOH) Interventions SDOH Screenings   Food Insecurity: No Food Insecurity (09/13/2023)  Housing: Low Risk  (09/13/2023)  Transportation Needs: No Transportation Needs (09/13/2023)  Utilities: Not At Risk (09/13/2023)  Depression (PHQ2-9): High Risk (02/11/2023)  Financial Resource Strain: Low Risk  (01/04/2023)  Physical Activity: Inactive (01/04/2023)  Social Connections: Moderately Integrated (09/13/2023)  Stress: No Stress Concern Present (01/04/2023)  Tobacco Use: Low Risk  (09/13/2023)    Readmission Risk Interventions     No data to display

## 2023-09-16 DIAGNOSIS — J9621 Acute and chronic respiratory failure with hypoxia: Secondary | ICD-10-CM | POA: Diagnosis not present

## 2023-09-16 MED ORDER — ORAL CARE MOUTH RINSE: 15.0000 mL | OROMUCOSAL | Status: DC | PRN

## 2023-09-16 NOTE — Progress Notes (Signed)
 PT Cancellation Note  Patient Details Name: Wayne Booth MRN: [id number] DOB: 11/10/1940   Cancelled Treatment:    Reason Eval/Treat Not Completed: Other (comment) (pt eating lunch, he requested PT check back another time. Will follow.)  Tamala Ser PT 09/16/2023  Acute Rehabilitation Services  Office [phone]

## 2023-09-16 NOTE — Progress Notes (Signed)
 Mobility Specialist - Progress Note   09/16/23 0909  Oxygen Therapy  SpO2 93 %  O2 Device Nasal Cannula  O2 Flow Rate (L/min) 3 L/min  Patient Activity (if Appropriate) Ambulating  Mobility  Activity Ambulated with assistance in hallway  Level of Assistance Standby assist, set-up cues, supervision of patient - no hands on  Assistive Device Front wheel walker  Distance Ambulated (ft) 80 ft  Activity Response Tolerated well  Mobility Referral Yes  Mobility visit 1 Mobility  Mobility Specialist Start Time (ACUTE ONLY) 0849  Mobility Specialist Stop Time (ACUTE ONLY) 0908  Mobility Specialist Time Calculation (min) (ACUTE ONLY) 19 min   Pt received in recliner and agreeable to mobility. Pt was minA from STS. Pt SOB throughout session. No complaints during session. Pt to recliner after session with all needs met.    Pre-mobility: 74 HR, 95% SpO2 (3L [place]) During mobility:  93% SpO2 (3L ) Post-mobility: 79 HR, 91% SPO2 (3L )  Chief Technology Officer

## 2023-09-16 NOTE — Progress Notes (Signed)
 PROGRESS NOTE    Wayne Booth  NWG:[id number] DOB: 01-30-1941 DOA: 09/13/2023 PCP: Shelva Majestic, MD    Brief Narrative: 83 year old male with history of interstitial lung disease, obstructive sleep apnea, COPD chronic hypoxic respiratory failure on 4 to 5 L of oxygen admitted with gradually worsening shortness of breath  Patient was recently admitted to the hospital on 08/29/2023 and discharged 09/07/2023 with sepsis/pneumonia he was DNR comfort care at that time currently he is full code and wants to remain full code. Chest x-ray this admission low lung volumes with persistent bilateral patchy airspace opacities trace small pleural effusions bilateral Followed by Dr. Vassie Loll Assessment & Plan:   Principal Problem:   Acute on chronic hypoxic respiratory failure (HCC)   Chronic respiratory failure with hypoxia-patient has chronic hypoxic respiratory failure on 4 to 5 L of oxygen at home.  He is very hesitant to going home today.  He feels he is very weak and his wife will not be able to handle him at home.  At the same time he declined PT today.  Patient was recently admitted to the hospital discharged on March 11 and readmitted on the 17th. Continue prednisone  COPD/interstitial lung disease chest x-ray this admission with bilateral patchy infiltrates.  He was recently treated with antibiotics as above.  Patient requesting hospice services on discharge.  Continue inhalers/nebulizer Pulmicort DuoNeb and Lasix.   Peripheral neuropathy -Continue gabapentin TID   Depression Anxiety -Continue Lexapro, BuSpar -Resume Ativan   Estimated body mass index is 32.55 kg/m as calculated from the following:   Height as of this encounter: 5\' 11"  (1.803 m).   Weight as of this encounter: 105.9 kg.  DVT prophylaxis: Lovenox Code Status:   Code Status: Full Code Family Communication: None at bedside Disposition Plan: Discharge home pending palliative care discussions and attempts to minimize  readmission chance   Consultants:  Palliative care medicine   Procedures:  None   Antimicrobials: None   Subjective: Walked today Still worried how he will do at home objective: Vitals:   09/16/23 0908 09/16/23 0909 09/16/23 0912 09/16/23 1243  BP:    127/63  Pulse: 75   76  Resp: (!) 21   16  Temp:    (!) 97.4 F (36.3 C)  TempSrc:      SpO2: 97% 93% 98% 97%  Weight:      Height:        Intake/Output Summary (Last 24 hours) at 09/16/2023 1448 Last data filed at 09/16/2023 1154 Gross per 24 hour  Intake 1852 ml  Output 1175 ml  Net 677 ml   Filed Weights   09/13/23 1347 09/13/23 2200  Weight: 105 kg 105.9 kg    Examination:  General exam: Appears in no acute distress Respiratory system: Scattered rhonchi bilaterally Cardiovascular system: S1 & S2 heard, RRR. No JVD, murmurs, rubs, gallops or clicks. No pedal edema. Gastrointestinal system: Abdomen is distended, soft and nontender. No organomegaly or masses felt. Normal bowel sounds heard. Central nervous system: Alert and oriented. No focal neurological deficits. Extremities: 1+ bilateral pitting edema  Data Reviewed: I have personally reviewed following labs and imaging studies  CBC: Recent Labs  Lab 09/13/23 0515 09/14/23 0759  WBC 8.2 9.8  NEUTROABS 6.8  --   HGB 15.6 15.4  HCT 48.1 48.0  MCV 92.1 92.8  PLT 239 257   Basic Metabolic Panel: Recent Labs  Lab 09/13/23 0515 09/14/23 0759  NA 135 136  K 3.8 4.4  CL  104 102  CO2 25 28  GLUCOSE 108* 104*  BUN 14 20  CREATININE 0.83 0.91  CALCIUM 8.0* 8.3*   GFR: Estimated Creatinine Clearance: 78.8 mL/min (by C-G formula based on SCr of 0.91 mg/dL). Liver Function Tests: Recent Labs  Lab 09/13/23 0515  AST 26  ALT 33  ALKPHOS 44  BILITOT 0.8  PROT 6.8  ALBUMIN 2.6*   No results for input(s): "LIPASE", "AMYLASE" in the last 168 hours. No results for input(s): "AMMONIA" in the last 168 hours. Coagulation Profile: No results for  input(s): "INR", "PROTIME" in the last 168 hours. Cardiac Enzymes: No results for input(s): "CKTOTAL", "CKMB", "CKMBINDEX", "TROPONINI" in the last 168 hours. BNP (last 3 results) No results for input(s): "PROBNP" in the last 8760 hours. HbA1C: No results for input(s): "HGBA1C" in the last 72 hours. CBG: No results for input(s): "GLUCAP" in the last 168 hours. Lipid Profile: No results for input(s): "CHOL", "HDL", "LDLCALC", "TRIG", "CHOLHDL", "LDLDIRECT" in the last 72 hours. Thyroid Function Tests: No results for input(s): "TSH", "T4TOTAL", "FREET4", "T3FREE", "THYROIDAB" in the last 72 hours. Anemia Panel: No results for input(s): "VITAMINB12", "FOLATE", "FERRITIN", "TIBC", "IRON", "RETICCTPCT" in the last 72 hours. Sepsis Labs: Recent Labs  Lab 09/13/23 0752  LATICACIDVEN 1.0   No results found for this or any previous visit (from the past 240 hours).   Radiology Studies: No results found.  Scheduled Meds:  budesonide  0.25 mg Nebulization BID   enoxaparin (LOVENOX) injection  40 mg Subcutaneous QHS   escitalopram  20 mg Oral Daily   furosemide  20 mg Oral Daily   gabapentin  300 mg Oral TID   predniSONE  5 mg Oral Q breakfast   senna-docusate  1 tablet Oral BID   Continuous Infusions:   LOS: 1 day   Alwyn Ren, MD  09/16/2023, 2:48 PM

## 2023-09-16 NOTE — Progress Notes (Signed)
 Daily Progress Note   Patient Name: Wayne Booth       Date: 09/16/2023 DOB: 1940-12-10  Age: 83 y.o. MRN#: [id number] Attending Physician: Alwyn Ren, MD Primary Care Physician: Shelva Majestic, MD Admit Date: 09/13/2023  Reason for Consultation/Follow-up: Establishing goals of care  Subjective: Patient is awake alert resting in bed.  He is sitting in chair, feeling better today, did have a bowel movement.    remains full code full scope and wants to discuss with his outpatient regular pulmonologist about the extent of his COPD as well as his interstitial lung disease and his chronic hypoxia before he makes any further goals of care.  Length of Stay: 1  Current Medications: Scheduled Meds:   budesonide  0.25 mg Nebulization BID   enoxaparin (LOVENOX) injection  40 mg Subcutaneous QHS   escitalopram  20 mg Oral Daily   furosemide  20 mg Oral Daily   gabapentin  300 mg Oral TID   predniSONE  5 mg Oral Q breakfast   senna-docusate  1 tablet Oral BID    Continuous Infusions:   PRN Meds: acetaminophen **OR** acetaminophen, ipratropium-albuterol, LORazepam, ondansetron **OR** ondansetron (ZOFRAN) IV, mouth rinse  Physical Exam         Awake alert Resting in bed Complains of abdominal discomfort believes that he might be constipated Regular work of breathing  Vital Signs: BP 127/63 (BP Location: Right Arm)   Pulse 76   Temp (!) 97.4 F (36.3 C)   Resp 16   Ht 5\' 11"  (1.803 m)   Wt 105.9 kg   SpO2 97%   BMI 32.55 kg/m  SpO2: SpO2: 97 % O2 Device: O2 Device: Nasal Cannula O2 Flow Rate: O2 Flow Rate (L/min): 1 L/min  Intake/output summary:  Intake/Output Summary (Last 24 hours) at 09/16/2023 1305 Last data filed at 09/16/2023 1154 Gross per 24 hour  Intake  2212 ml  Output 1175 ml  Net 1037 ml   LBM: Last BM Date : 09/14/23 Baseline Weight: Weight: 105 kg Most recent weight: Weight: 105.9 kg       Palliative Assessment/Data:      Patient Active Problem List   Diagnosis Date Noted   Acute on chronic hypoxic respiratory failure (HCC) 09/13/2023   Lobar pneumonia (HCC) 08/29/2023   Severe sepsis (HCC) 08/29/2023  COPD (chronic obstructive pulmonary disease) (HCC) 08/29/2023   Palliative care patient 10/26/2022   Conjunctivitis 10/12/2022   Physical deconditioning 07/17/2022   Pharmacologic therapy 01/26/2021   Disorder of skeletal system 01/26/2021   Problems influencing health status 01/26/2021   ILD (interstitial lung disease) (HCC) 10/13/2019   Nodule of right lung 10/13/2019   Pneumonia due to COVID-19 virus 04/20/2019   PTSD (post-traumatic stress disorder) 07/27/2018   Memory loss 06/10/2018   Aortic atherosclerosis (HCC) 05/10/2018   Chronic respiratory failure with hypoxia (HCC) 04/26/2018   First degree AV block 04/15/2018   Hyponatremia 04/15/2018   Pleural effusion 04/14/2018   CAD (coronary artery disease) 04/09/2018   Chronic pain syndrome 04/09/2018   CKD (chronic kidney disease) stage 3, GFR 30-59 ml/min (HCC)    Dyspnea    HCAP (healthcare-associated pneumonia) 02/03/2018   Constipation 01/21/2018   (HFpEF) heart failure with preserved ejection fraction (HCC) 12/16/2017   Chest pain    Anticoagulated    Atrial flutter (HCC) 11/01/2017   Pain in left knee 07/15/2017   Bilateral foot pain 07/13/2017   Community acquired pneumonia 06/18/2017   Status post nephrectomy 06/09/2017   VTE (venous thromboembolism) 06/09/2017   History of adenomatous polyp of colon 04/12/2017   Chronic pulmonary embolism (HCC) 10/23/2016   DDD (degenerative disc disease), cervical 11/13/2015   Essential tremor 08/14/2015   Gout 08/14/2015   History of fusion of cervical spine 04/24/2015   Hypothyroidism 07/12/2014    Hyperglycemia 07/12/2014   GAD (generalized anxiety disorder) 11/07/2013   OSA (obstructive sleep apnea) 07/07/2011   Asthmatic bronchitis 06/09/2011   Actinic keratosis 11/27/2009   History of total knee replacement, 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  Dr. Laverle Patter 10/02/2008   History of renal cell carcinoma 12/26/2007   Insomnia 12/26/2007   TESTOSTERONE DEFICIENCY 12/28/2006   Essential hypertension 12/28/2006   History of total knee replacement, left 10/28/2006    Palliative Care Assessment & Plan   Patient Profile:    Assessment:  83 year old gentleman who lives at home with his wife.  He follows with Dr. Vassie Loll from pulmonary services for COPD and interstitial lung disease however has not had a recent appointment.  Patient was recently hospitalized for community-acquired pneumonia.  Patient's baseline is such that he is chronically hypoxic and requires 4 to 5 L of oxygen via nasal cannula on the pretty much continuous basis. Patient admitted with increased work of breathing and remains admitted to hospital medicine service. Palliative consult for CODE STATUS and broad goals of care discussions has been requested. Chart reviewed, patient seen and examined.  Recommendations/Plan:   bowel regimen Full code full scope Continue current mode of care To be set up with outpatient palliative services.  Patient wishes to discuss further with his pulmonologist in the outpatient setting before deciding on further goals of care.   Goals of Care and Additional Recommendations: Limitations on Scope of Treatment: Full Scope Treatment  Code Status:    Code Status Orders  (From admission, onward)           Start     Ordered   09/13/23 0836  Full code  Continuous       Question:  By:  Answer:  Consent: discussion documented in EHR   09/13/23 0835           Code Status History     Date Active Date Inactive Code  Status Order ID Comments User Context  08/29/2023 0937 09/07/2023 2000 Full Code [id number]  Standley Brooking, MD ED   07/14/2019 1044 07/14/2019 2242 Full Code [id number]  Corky Crafts, MD Inpatient   04/23/2019 0740 04/28/2019 1631 Full Code [id number]  Rometta Emery, MD Inpatient   04/15/2018 0056 04/20/2018 1620 Full Code [id number]  John Giovanni, MD Inpatient   04/09/2018 0125 04/11/2018 1639 Full Code [id number]  John Giovanni, MD Inpatient   02/28/2018 0827 03/05/2018 1248 Full Code [id number]  Jacquelynn Cree, PA-C Inpatient   02/26/2018 1340 02/28/2018 0827 DNR [id number]  Charlton Amor, PA-C Inpatient   02/26/2018 1340 02/26/2018 1340 Full Code [id number]  Lynnae Prude Inpatient   02/21/2018 1107 02/26/2018 1329 DNR [id number]  Alyson Reedy, MD Inpatient   02/03/2018 0007 02/21/2018 1107 Full Code [id number]  Eduard Clos, MD ED   01/28/2018 1655 01/29/2018 1446 Full Code [id number]  Regan Lemming, MD Inpatient   12/03/2017 1948 12/08/2017 1742 Full Code [id number]  Eddie North, MD Inpatient   11/01/2017 2023 11/04/2017 0154 Full Code [id number]  Eduard Clos, MD Inpatient   04/24/2015 1218 04/26/2015 1126 Full Code [id number]  Tia Alert, MD Inpatient       Prognosis:  Unable to determine  Discharge Planning: Home with Palliative Services  Care plan was discussed with patient  Thank you for allowing the Palliative Medicine Team to assist in the care of this patient. Mod MDM     Greater than 50%  of this time was spent counseling and coordinating care related to the above assessment and plan.  Rosalin Hawking, MD  Please contact Palliative Medicine Team phone at [phone] for questions and concerns.

## 2023-09-17 DIAGNOSIS — J9621 Acute and chronic respiratory failure with hypoxia: Secondary | ICD-10-CM | POA: Diagnosis not present

## 2023-09-17 MED ORDER — ACETAMINOPHEN 325 MG PO TABS: 650.0000 mg | ORAL_TABLET | Freq: Four times a day (QID) | ORAL | Status: DC | PRN

## 2023-09-17 MED ORDER — ONDANSETRON HCL 4 MG PO TABS: 4.0000 mg | ORAL_TABLET | Freq: Four times a day (QID) | ORAL | 0 refills | Status: AC | PRN

## 2023-09-17 NOTE — TOC Transition Note (Signed)
 Transition of Care [facility]) - Discharge Note   Patient Details  Name: Wayne Booth MRN: [id number] Date of Birth: 05-19-1941  Transition of Care [facility]) CM/SW Contact:  Lanier Clam, RN Phone Number: 09/17/2023, 9:26 AM   Clinical Narrative: spoke to spouse Jody about d/c plans-agree to d/c w/HHC-[facility] already accepted rep Marylene Land aware;[facility] rep aware of d/c otpt palliative care services. Has own home 02 travel tank. Has own transport home. No further CM needs.      Final next level of care: Home w Home Health Services Barriers to Discharge: No Barriers Identified   Patient Goals and CMS Choice Patient states their goals for this hospitalization and ongoing recovery are:: Home CMS Medicare.gov Compare Post Acute Care list provided to:: Patient Represenative (must comment) (Jody(spouse)) Choice offered to / list presented to : Spouse [place] ownership interest in [facility].provided to:: Spouse    Discharge Placement                       Discharge Plan and Services Additional resources added to the After Visit Summary for     Discharge Planning Services: CM Consult Post Acute Care Choice: Home Health                    HH Arranged: PT, OT [facility] Agency: [facility] Date [facility] Agency Contacted: 09/15/23 Time HH Agency Contacted: [phone] Representative spoke with at [facility] Agency: Marylene Land  Social Drivers of Health (SDOH) Interventions SDOH Screenings   Food Insecurity: No Food Insecurity (09/13/2023)  Housing: Low Risk  (09/13/2023)  Transportation Needs: No Transportation Needs (09/13/2023)  Utilities: Not At Risk (09/13/2023)  Depression (PHQ2-9): High Risk (02/11/2023)  Financial Resource Strain: Low Risk  (01/04/2023)  Physical Activity: Inactive (01/04/2023)  Social Connections: Moderately Integrated (09/13/2023)  Stress: No Stress Concern Present (01/04/2023)  Tobacco Use: Low Risk  (09/13/2023)     Readmission Risk Interventions     No data  to display

## 2023-09-17 NOTE — Progress Notes (Signed)
 PT Cancellation Note  Patient Details Name: Wayne Booth MRN: [id number] DOB: 1940/10/01   Cancelled Treatment:    Reason Eval/Treat Not Completed: Other (comment). Pt sleeping in am. PT to return as schedule allows. PT to continue to follow acutely.   Johnny Bridge, PT Acute Rehab   Jacqualyn Posey 09/17/2023, 10:50 AM

## 2023-09-18 NOTE — Discharge Summary (Signed)
 Physician Discharge Summary  Wayne Booth:[id number] DOB: 11-28-1940 DOA: 09/13/2023  PCP: Shelva Majestic, MD  Admit date: 09/13/2023 Discharge date: 09/17/2023  Admitted From: home Disposition:home Recommendations for Outpatient Follow-up:  Follow up with PCP in 1-2 weeks Please obtain BMP/CBC in one week  Home Health: Yes Equipment/Devices none Discharge Condition: Stable CODE STATUS: Full code Diet recommendation: Cardiac Brief/Interim Summary:   83 year old male with history of interstitial lung disease, obstructive sleep apnea, COPD chronic hypoxic respiratory failure on 4 to 5 L of oxygen admitted with gradually worsening shortness of breath  Patient was recently admitted to the hospital on 08/29/2023 and discharged 09/07/2023 with sepsis/pneumonia he was DNR comfort care at that time currently he is full code and wants to remain full code. Chest x-ray this admission low lung volumes with persistent bilateral patchy airspace opacities trace small pleural effusions bilateral Followed by Dr. Vassie Loll Discharge Diagnoses:  Principal Problem:   Acute on chronic hypoxic respiratory failure (HCC)     Chronic respiratory failure with hypoxia-patient has chronic hypoxic respiratory failure on 4 to 5 L of oxygen at home.  He was discharged on prednisone. COPD/interstitial lung disease chest x-ray this admission with bilateral patchy infiltrates.  He was recently treated with antibiotics as above.  Patient was seen by palliative care during the hospital stay and he wanted to remain full code.  Continue inhalers/nebulizer Pulmicort DuoNeb and Lasix.   Peripheral neuropathy -Continue gabapentin TID   Depression Anxiety -Continue Lexapro, BuSpar -Continue Ativan  Estimated body mass index is 32.55 kg/m as calculated from the following:   Height as of this encounter: 5\' 11"  (1.803 m).   Weight as of this encounter: 105.9 kg.  Discharge Instructions  Discharge Instructions      Diet - low sodium heart healthy   Complete by: As directed    Increase activity slowly   Complete by: As directed       Allergies as of 09/17/2023       Reactions   Ambien [zolpidem] Anxiety   Anxiety and agitation when tried as sleeper in hospital aug 2019        Medication List     STOP taking these medications    busPIRone 5 MG tablet Commonly known as: BUSPAR   predniSONE 5 MG tablet Commonly known as: DELTASONE       TAKE these medications    acetaminophen 325 MG tablet Commonly known as: TYLENOL Take 2 tablets (650 mg total) by mouth every 6 (six) hours as needed for mild pain (pain score 1-3) (or Fever >/= 101).   albuterol (2.5 MG/3ML) 0.083% nebulizer solution Commonly known as: PROVENTIL Take 3 mLs (2.5 mg total) by nebulization every 6 (six) hours as needed for wheezing or shortness of breath. What changed: Another medication with the same name was changed. Make sure you understand how and when to take each.   albuterol 108 (90 Base) MCG/ACT inhaler Commonly known as: VENTOLIN HFA INHALE 2 PUFFS BY MOUTH EVERY 6 HOURS AS NEEDED FOR WHEEZING OR SHORTNESS OF BREATH What changed: See the new instructions.   budesonide 0.25 MG/2ML nebulizer solution Commonly known as: PULMICORT Take 2 mLs (0.25 mg total) by nebulization 2 (two) times daily. What changed:  when to take this reasons to take this   escitalopram 20 MG tablet Commonly known as: LEXAPRO TAKE 1 TABLET BY MOUTH DAILY   furosemide 20 MG tablet Commonly known as: LASIX Take 1 tablet (20 mg total) by mouth daily. What  changed:  when to take this reasons to take this   gabapentin 300 MG capsule Commonly known as: NEURONTIN Take 1 capsule (300 mg total) by mouth 3 (three) times daily.   ipratropium-albuterol 0.5-2.5 (3) MG/3ML Soln Commonly known as: DUONEB Take 3 mLs by nebulization every 6 (six) hours as needed. What changed: reasons to take this   LORazepam 0.5 MG  tablet Commonly known as: ATIVAN Take 1 tablet (0.5 mg total) by mouth daily as needed for anxiety (do not take within 8 hours of bed. do not drive for 8 hours after taking.).   ondansetron 4 MG tablet Commonly known as: ZOFRAN Take 1 tablet (4 mg total) by mouth every 6 (six) hours as needed for nausea.   traMADol 50 MG tablet Commonly known as: ULTRAM Take 1 tablet (50 mg total) by mouth every 6 (six) hours as needed (prefer max 3 per day). What changed: reasons to take this   traZODone 50 MG tablet Commonly known as: DESYREL Take 0.5-1 tablets (25-50 mg total) by mouth at bedtime as needed for sleep. What changed:  how much to take when to take this        Follow-up Information     [facility] Follow up.   Specialty: Home Health Services Why: [facility] physical therapy/occupational therapy Contact information: [address] [place] [address] [phone]         [facility] Follow up.   Why: outpatient palliative care services Contact information: [address] [place] [address] [phone]        Shelva Majestic, MD Follow up.   Specialty: Family Medicine Contact information: [address] [place] [address] [phone]                Allergies  Allergen Reactions   Ambien [Zolpidem] Anxiety    Anxiety and agitation when tried as sleeper in hospital aug 2019    Consultations:palliative   Procedures/Studies: DG Chest Port 1 View Result Date: 09/13/2023 CLINICAL DATA:  sob EXAM: PORTABLE CHEST 1 VIEW COMPARISON:  Chest x-ray 08/29/2023, CT chest 08/29/2023 FINDINGS: More prominent mediastinum due to AP portable technique. The heart and mediastinal contours are unchanged. Atherosclerotic plaque. Right apex collimated off view due to overlying mandible. Low lung volumes. Persistent bilateral patchy airspace opacities, more prominent throughout the right lung and  at the left lower lung zone. At least bilateral trace to small volume pleural effusions. No pneumothorax. No acute osseous abnormality. IMPRESSION: 1. Low lung volumes with persistent bilateral patchy airspace opacities. 2.  At least bilateral trace to small volume pleural effusions. Electronically Signed   By: Tish Frederickson M.D.   On: 09/13/2023 04:56   ECHOCARDIOGRAM COMPLETE Result Date: 08/31/2023    ECHOCARDIOGRAM REPORT   Patient Name:   KADYN GUILD Kostick Date of Exam: 08/31/2023 Medical Rec #:  [id number]       Height:       71.0 in Accession #:    [id number]      Weight:       233.7 lb Date of Birth:  1941-04-05      BSA:          2.253 m Patient Age:    82 years        BP:           126/77 mmHg Patient Gender: M               HR:  87 bpm. Exam Location:  Inpatient Procedure: 2D Echo, Cardiac Doppler, Color Doppler and Intracardiac            Opacification Agent (Both Spectral and Color Flow Doppler were            utilized during procedure). Indications:    Dyspnea  History:        Patient has prior history of Echocardiogram examinations, most                 recent 02/04/2018. CHF, Arrythmias:Atrial Flutter; Risk                 Factors:Hypertension.  Sonographer:    Amy Chionchio Referring Phys: [id number] Cristopher Peru IMPRESSIONS  1. Left ventricular ejection fraction, by estimation, is 65 to 70%. The left ventricle has normal function. The left ventricle has no regional wall motion abnormalities. Left ventricular diastolic function could not be evaluated.  2. Right ventricular systolic function was not well visualized. The right ventricular size is not well visualized.  3. The mitral valve is degenerative. Trivial mitral valve regurgitation. Moderate to severe mitral annular calcification.  4. The aortic valve is tricuspid. Aortic valve regurgitation is not visualized. Mild aortic valve stenosis. Aortic valve area, by VTI measures 1.14 cm. Aortic valve mean gradient measures 16.0 mmHg. Aortic  valve Vmax measures 2.74 m/s. FINDINGS  Left Ventricle: Left ventricular ejection fraction, by estimation, is 65 to 70%. The left ventricle has normal function. The left ventricle has no regional wall motion abnormalities. The left ventricular internal cavity size was normal in size. There is  no left ventricular hypertrophy. Left ventricular diastolic function could not be evaluated due to mitral annular calcification (moderate or greater). Left ventricular diastolic function could not be evaluated. Right Ventricle: The right ventricular size is not well visualized. Right vetricular wall thickness was not well visualized. Right ventricular systolic function was not well visualized. Left Atrium: Left atrial size was not well visualized. Right Atrium: Right atrial size was not well visualized. Pericardium: There is no evidence of pericardial effusion. Presence of epicardial fat layer. Mitral Valve: The mitral valve is degenerative in appearance. Moderate to severe mitral annular calcification. Trivial mitral valve regurgitation. MV peak gradient, 5.4 mmHg. The mean mitral valve gradient is 3.0 mmHg. Tricuspid Valve: The tricuspid valve is grossly normal. Tricuspid valve regurgitation is mild . No evidence of tricuspid stenosis. Aortic Valve: The aortic valve is tricuspid. Aortic valve regurgitation is not visualized. Mild aortic stenosis is present. Aortic valve mean gradient measures 16.0 mmHg. Aortic valve peak gradient measures 30.0 mmHg. Aortic valve area, by VTI measures 1.14 cm. Pulmonic Valve: The pulmonic valve was grossly normal. Pulmonic valve regurgitation is not visualized. No evidence of pulmonic stenosis. Aorta: The aortic root and ascending aorta are structurally normal, with no evidence of dilitation. Venous: The inferior vena cava was not well visualized. IAS/Shunts: The interatrial septum was not well visualized.  LEFT VENTRICLE PLAX 2D LVIDd:         4.30 cm     Diastology LVIDs:         3.60 cm      LV e' medial:    7.51 cm/s LV PW:         1.10 cm     LV E/e' medial:  13.8 LV IVS:        1.10 cm     LV e' lateral:   5.66 cm/s LVOT diam:     2.10 cm  LV E/e' lateral: 18.4 LV SV:         59 LV SV Index:   26 LVOT Area:     3.46 cm  LV Volumes (MOD) LV vol d, MOD A2C: 83.7 ml LV vol d, MOD A4C: 91.2 ml LV vol s, MOD A2C: 28.7 ml LV vol s, MOD A4C: 25.4 ml LV SV MOD A2C:     55.0 ml LV SV MOD A4C:     91.2 ml LV SV MOD BP:      61.6 ml RIGHT VENTRICLE TAPSE (M-mode): 1.3 cm AORTIC VALVE                     PULMONIC VALVE AV Area (Vmax):    1.12 cm      PV Vmax:       1.35 m/s AV Area (Vmean):   1.05 cm      PV Peak grad:  7.3 mmHg AV Area (VTI):     1.14 cm AV Vmax:           274.00 cm/s AV Vmean:          194.000 cm/s AV VTI:            0.512 m AV Peak Grad:      30.0 mmHg AV Mean Grad:      16.0 mmHg LVOT Vmax:         88.30 cm/s LVOT Vmean:        58.900 cm/s LVOT VTI:          0.169 m LVOT/AV VTI ratio: 0.33  AORTA Ao Root diam: 3.20 cm Ao Asc diam:  3.30 cm MITRAL VALVE                TRICUSPID VALVE MV Area (PHT): 4.10 cm     TR Peak grad:   37.0 mmHg MV Area VTI:   2.17 cm     TR Vmax:        304.00 cm/s MV Peak grad:  5.4 mmHg MV Mean grad:  3.0 mmHg     SHUNTS MV Vmax:       1.16 m/s     Systemic VTI:  0.17 m MV Vmean:      79.0 cm/s    Systemic Diam: 2.10 cm MV Decel Time: 185 msec MV E velocity: 104.00 cm/s MV A velocity: 82.00 cm/s MV E/A ratio:  1.27 Lennie Odor MD Electronically signed by Lennie Odor MD Signature Date/Time: 08/31/2023/11:00:56 AM    Final    CT Angio Chest PE W and/or Wo Contrast Result Date: 08/29/2023 CLINICAL DATA:  Evaluate for pulmonary embolism. Shortness of breath and COPD. EXAM: CT ANGIOGRAPHY CHEST WITH CONTRAST TECHNIQUE: Multidetector CT imaging of the chest was performed using the standard protocol during bolus administration of intravenous contrast. Multiplanar CT image reconstructions and MIPs were obtained to evaluate the vascular anatomy. RADIATION DOSE  REDUCTION: This exam was performed according to the departmental dose-optimization program which includes automated exposure control, adjustment of the mA and/or kV according to patient size and/or use of iterative reconstruction technique. CONTRAST:  OMNIPAQUE IOHEXOL 350 MG/ML SOLN COMPARISON:  CT 09/16/2022 FINDINGS: Cardiovascular: Satisfactory opacification of the pulmonary arteries to the segmental level. No evidence of pulmonary embolism. Mild cardiac enlargement. Aortic atherosclerosis. Coronary artery calcifications. Mediastinum/Nodes: No enlarged mediastinal, hilar, or axillary lymph nodes. Thyroid gland, trachea, and esophagus demonstrate no significant findings. Lungs/Pleura: Small left pleural effusion. Chronic fibrotic interstitial lung disease is again noted.  Multifocal, bilateral consolidative change identified. This is most severe within the posterior basal right upper lobe and left lower lobe as well as the lingula. Unchanged chronic consolidative density with calcification involving the inferior lingula. No pneumothorax identified. There is a nodule within the right midlung measuring 1.3 x 1.1 cm, image 67/5. On the previous exam this measured 1.1 by 1.0 cm. None Upper Abdomen: No acute abnormality within the imaged portions of the upper abdomen. Cholecystectomy. 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 Musculoskeletal: Spondylosis within the thoracic spine. No acute or suspicious findings. Review of the MIP images confirms the above findings. IMPRESSION: 1. No evidence for acute pulmonary embolism. 2. Chronic fibrotic interstitial lung disease. 3. Multifocal, bilateral consolidative change identified. This is most severe within the posterior basal right upper lobe and left lower lobe. Findings are favored to represent multifocal pneumonia. Follow-up imaging is advised to ensure resolution and to exclude underlying malignancy. 4. Right midlung nodule  has mildly increased in the interval. Currently 1.3 cm. Recommend referral to pulmonary medicine or thoracic oncology for further management. 5. Small left pleural effusion. 6. Coronary artery calcifications. 7.  Aortic Atherosclerosis (ICD10-I70.0). Electronically Signed   By: Signa Kell M.D.   On: 08/29/2023 08:12   DG Chest Port 1 View Result Date: 08/29/2023 CLINICAL DATA:  Shortness of breath EXAM: PORTABLE CHEST 1 VIEW COMPARISON:  07/17/2022 FINDINGS: Cardiac shadow is enlarged but stable. Patchy airspace opacities are again identified as well as small pleural effusions. No new focal infiltrate is seen. No bony abnormality is noted. IMPRESSION: Stable appearance of the chest when compared with the prior exam. Electronically Signed   By: Alcide Clever M.D.   On: 08/29/2023 03:53   (Echo, Carotid, EGD, Colonoscopy, ERCP)    Subjective:  Walked  with staff yest Discharge Exam: Vitals:   09/17/23 0446 09/17/23 0843  BP: 112/84   Pulse: 83   Resp: 20   Temp: (!) 97.5 F (36.4 C)   SpO2: 96% 96%   Vitals:   09/16/23 2117 09/16/23 2322 09/17/23 0446 09/17/23 0843  BP: (!) 140/71  112/84   Pulse: 84  83   Resp: 20  20   Temp: 97.6 F (36.4 C)  (!) 97.5 F (36.4 C)   TempSrc: Oral  Oral   SpO2: 93% 96% 96% 96%  Weight:      Height:        General: Pt is alert, awake, not in acute distress Cardiovascular: RRR, S1/S2 +, no rubs, no gallops Respiratory: rhonchi bilaterally Abdominal: Soft, NT, ND, bowel sounds + Extremities: no edema, no cyanosis    The results of significant diagnostics from this hospitalization (including imaging, microbiology, ancillary and laboratory) are listed below for reference.     Microbiology: No results found for this or any previous visit (from the past 240 hours).   Labs: BNP (last 3 results) Recent Labs    08/29/23 0326 09/13/23 0515  BNP 38.3 59.9   Basic Metabolic Panel: Recent Labs  Lab 09/13/23 0515 09/14/23 0759  NA 135  136  K 3.8 4.4  CL 104 102  CO2 25 28  GLUCOSE 108* 104*  BUN 14 20  CREATININE 0.83 0.91  CALCIUM 8.0* 8.3*   Liver Function Tests: Recent Labs  Lab 09/13/23 0515  AST 26  ALT 33  ALKPHOS 44  BILITOT 0.8  PROT 6.8  ALBUMIN 2.6*   No results for input(s): "LIPASE", "AMYLASE" in the last 168  hours. No results for input(s): "AMMONIA" in the last 168 hours. CBC: Recent Labs  Lab 09/13/23 0515 09/14/23 0759  WBC 8.2 9.8  NEUTROABS 6.8  --   HGB 15.6 15.4  HCT 48.1 48.0  MCV 92.1 92.8  PLT 239 257   Cardiac Enzymes: No results for input(s): "CKTOTAL", "CKMB", "CKMBINDEX", "TROPONINI" in the last 168 hours. BNP: Invalid input(s): "POCBNP" CBG: No results for input(s): "GLUCAP" in the last 168 hours. D-Dimer No results for input(s): "DDIMER" in the last 72 hours. Hgb A1c No results for input(s): "HGBA1C" in the last 72 hours. Lipid Profile No results for input(s): "CHOL", "HDL", "LDLCALC", "TRIG", "CHOLHDL", "LDLDIRECT" in the last 72 hours. Thyroid function studies No results for input(s): "TSH", "T4TOTAL", "T3FREE", "THYROIDAB" in the last 72 hours.  Invalid input(s): "FREET3" Anemia work up No results for input(s): "VITAMINB12", "FOLATE", "FERRITIN", "TIBC", "IRON", "RETICCTPCT" in the last 72 hours. Urinalysis    Component Value Date/Time   COLORURINE AMBER (A) 04/20/2019 1438   APPEARANCEUR CLEAR 04/20/2019 1438   LABSPEC 1.017 04/20/2019 1438   PHURINE 5.0 04/20/2019 1438   GLUCOSEU NEGATIVE 04/20/2019 1438   HGBUR MODERATE (A) 04/20/2019 1438   HGBUR 2+ 09/22/2007 1444   BILIRUBINUR NEGATIVE 04/20/2019 1438   BILIRUBINUR n 11/07/2015 1417   KETONESUR NEGATIVE 04/20/2019 1438   PROTEINUR NEGATIVE 04/20/2019 1438   UROBILINOGEN 1.0 11/07/2015 1417   UROBILINOGEN 1.0 05/22/2011 1454   NITRITE NEGATIVE 04/20/2019 1438   LEUKOCYTESUR NEGATIVE 04/20/2019 1438   Sepsis Labs Recent Labs  Lab 09/13/23 0515 09/14/23 0759  WBC 8.2 9.8    Microbiology No results found for this or any previous visit (from the past 240 hours).   Time coordinating discharge:  37 minutes  SIGNED:   Alwyn Ren, MD  [facility] 09/18/2023, 5:43 PM

## 2023-09-20 ENCOUNTER — Telehealth: Payer: Self-pay

## 2023-09-20 DIAGNOSIS — I4891 Unspecified atrial fibrillation: Secondary | ICD-10-CM | POA: Diagnosis not present

## 2023-09-20 DIAGNOSIS — J9621 Acute and chronic respiratory failure with hypoxia: Secondary | ICD-10-CM | POA: Diagnosis not present

## 2023-09-20 DIAGNOSIS — J449 Chronic obstructive pulmonary disease, unspecified: Secondary | ICD-10-CM | POA: Diagnosis not present

## 2023-09-20 DIAGNOSIS — I2510 Atherosclerotic heart disease of native coronary artery without angina pectoris: Secondary | ICD-10-CM | POA: Diagnosis not present

## 2023-09-20 NOTE — Telephone Encounter (Signed)
 Noted.  Copied from CRM [id number]. Topic: General - Other >> Sep 20, 2023 10:40 AM [name] wrote: Reason for CRM: sherry from [facility] care collective called stating they have received palliative care for the patient and they will be following CB [phone]

## 2023-09-21 ENCOUNTER — Encounter (HOSPITAL_BASED_OUTPATIENT_CLINIC_OR_DEPARTMENT_OTHER): Payer: Self-pay | Admitting: Primary Care

## 2023-09-21 ENCOUNTER — Inpatient Hospital Stay (HOSPITAL_BASED_OUTPATIENT_CLINIC_OR_DEPARTMENT_OTHER): Admitting: Primary Care

## 2023-09-22 ENCOUNTER — Telehealth: Payer: Self-pay

## 2023-09-22 NOTE — Telephone Encounter (Signed)
 Called and spoke with [facility] and VO given.  Copied from CRM [id number]. Topic: Clinical - Home Health Verbal Orders >> Sep 22, 2023  9:58 AM Marica Otter wrote: Caller/Agency: [facility] Callback Number: [phone] Secure line Service Requested: Physical Therapy/Nursing Frequency: 2x week for 3 weeks 1x week for 3 weeks, Nursing not sure at this time Any new concerns about the patient? No

## 2023-09-23 ENCOUNTER — Telehealth: Payer: Self-pay

## 2023-09-23 NOTE — Telephone Encounter (Signed)
 Called and spoke with Liji and VO given.  Copied from CRM [id number]. Topic: Clinical - Home Health Verbal Orders >> Sep 23, 2023 11:13 AM Denese Killings wrote: Caller/Agency: [facility] Callback Number: [phone] Service Requested: Home Health Aide  Frequency: 2 week 4 start next week and has already been approved Any new concerns about the patient? No

## 2023-09-29 DIAGNOSIS — J9621 Acute and chronic respiratory failure with hypoxia: Secondary | ICD-10-CM | POA: Diagnosis not present

## 2023-09-29 DIAGNOSIS — I4891 Unspecified atrial fibrillation: Secondary | ICD-10-CM | POA: Diagnosis not present

## 2023-09-29 DIAGNOSIS — J449 Chronic obstructive pulmonary disease, unspecified: Secondary | ICD-10-CM | POA: Diagnosis not present

## 2023-09-29 DIAGNOSIS — I2510 Atherosclerotic heart disease of native coronary artery without angina pectoris: Secondary | ICD-10-CM | POA: Diagnosis not present

## 2023-09-30 ENCOUNTER — Telehealth: Payer: Self-pay | Admitting: Family Medicine

## 2023-09-30 NOTE — Telephone Encounter (Signed)
 Forms completed and faxed back.

## 2023-09-30 NOTE — Telephone Encounter (Signed)
 Received faxed  document Home Health Certificate (Order ID [id number]), to be filled out by provider. Patient requested to send it back via Fax . Document is located in providers tray at front office.Please advise

## 2023-10-01 DIAGNOSIS — I2510 Atherosclerotic heart disease of native coronary artery without angina pectoris: Secondary | ICD-10-CM | POA: Diagnosis not present

## 2023-10-01 DIAGNOSIS — J449 Chronic obstructive pulmonary disease, unspecified: Secondary | ICD-10-CM | POA: Diagnosis not present

## 2023-10-01 DIAGNOSIS — I4891 Unspecified atrial fibrillation: Secondary | ICD-10-CM | POA: Diagnosis not present

## 2023-10-01 DIAGNOSIS — J9621 Acute and chronic respiratory failure with hypoxia: Secondary | ICD-10-CM | POA: Diagnosis not present

## 2023-10-04 DIAGNOSIS — J449 Chronic obstructive pulmonary disease, unspecified: Secondary | ICD-10-CM | POA: Diagnosis not present

## 2023-10-04 DIAGNOSIS — J9621 Acute and chronic respiratory failure with hypoxia: Secondary | ICD-10-CM | POA: Diagnosis not present

## 2023-10-04 DIAGNOSIS — I4891 Unspecified atrial fibrillation: Secondary | ICD-10-CM | POA: Diagnosis not present

## 2023-10-04 DIAGNOSIS — I2510 Atherosclerotic heart disease of native coronary artery without angina pectoris: Secondary | ICD-10-CM | POA: Diagnosis not present

## 2023-10-05 DIAGNOSIS — J9621 Acute and chronic respiratory failure with hypoxia: Secondary | ICD-10-CM | POA: Diagnosis not present

## 2023-10-05 DIAGNOSIS — I2510 Atherosclerotic heart disease of native coronary artery without angina pectoris: Secondary | ICD-10-CM | POA: Diagnosis not present

## 2023-10-05 DIAGNOSIS — I4891 Unspecified atrial fibrillation: Secondary | ICD-10-CM | POA: Diagnosis not present

## 2023-10-05 DIAGNOSIS — J449 Chronic obstructive pulmonary disease, unspecified: Secondary | ICD-10-CM | POA: Diagnosis not present

## 2023-10-08 DIAGNOSIS — J449 Chronic obstructive pulmonary disease, unspecified: Secondary | ICD-10-CM | POA: Diagnosis not present

## 2023-10-08 DIAGNOSIS — J9621 Acute and chronic respiratory failure with hypoxia: Secondary | ICD-10-CM | POA: Diagnosis not present

## 2023-10-08 DIAGNOSIS — I4891 Unspecified atrial fibrillation: Secondary | ICD-10-CM | POA: Diagnosis not present

## 2023-10-08 DIAGNOSIS — I2510 Atherosclerotic heart disease of native coronary artery without angina pectoris: Secondary | ICD-10-CM | POA: Diagnosis not present

## 2023-10-09 ENCOUNTER — Other Ambulatory Visit: Payer: Self-pay | Admitting: Family Medicine

## 2023-10-11 NOTE — Telephone Encounter (Signed)
 Needs to make appt w/Dr. Arlene Ben

## 2023-10-12 DIAGNOSIS — J9621 Acute and chronic respiratory failure with hypoxia: Secondary | ICD-10-CM | POA: Diagnosis not present

## 2023-10-12 DIAGNOSIS — I2510 Atherosclerotic heart disease of native coronary artery without angina pectoris: Secondary | ICD-10-CM | POA: Diagnosis not present

## 2023-10-12 DIAGNOSIS — I4891 Unspecified atrial fibrillation: Secondary | ICD-10-CM | POA: Diagnosis not present

## 2023-10-12 DIAGNOSIS — J449 Chronic obstructive pulmonary disease, unspecified: Secondary | ICD-10-CM | POA: Diagnosis not present

## 2023-10-13 DIAGNOSIS — J9621 Acute and chronic respiratory failure with hypoxia: Secondary | ICD-10-CM | POA: Diagnosis not present

## 2023-10-13 DIAGNOSIS — J449 Chronic obstructive pulmonary disease, unspecified: Secondary | ICD-10-CM | POA: Diagnosis not present

## 2023-10-13 DIAGNOSIS — I2510 Atherosclerotic heart disease of native coronary artery without angina pectoris: Secondary | ICD-10-CM | POA: Diagnosis not present

## 2023-10-13 DIAGNOSIS — I4891 Unspecified atrial fibrillation: Secondary | ICD-10-CM | POA: Diagnosis not present

## 2023-10-15 DIAGNOSIS — I4891 Unspecified atrial fibrillation: Secondary | ICD-10-CM | POA: Diagnosis not present

## 2023-10-15 DIAGNOSIS — J449 Chronic obstructive pulmonary disease, unspecified: Secondary | ICD-10-CM | POA: Diagnosis not present

## 2023-10-15 DIAGNOSIS — I2510 Atherosclerotic heart disease of native coronary artery without angina pectoris: Secondary | ICD-10-CM | POA: Diagnosis not present

## 2023-10-15 DIAGNOSIS — J9621 Acute and chronic respiratory failure with hypoxia: Secondary | ICD-10-CM | POA: Diagnosis not present

## 2023-10-19 ENCOUNTER — Other Ambulatory Visit: Payer: Self-pay

## 2023-10-19 ENCOUNTER — Emergency Department (HOSPITAL_COMMUNITY)

## 2023-10-19 ENCOUNTER — Encounter (HOSPITAL_COMMUNITY): Payer: Self-pay

## 2023-10-19 ENCOUNTER — Inpatient Hospital Stay (HOSPITAL_COMMUNITY)
Admission: EM | Admit: 2023-10-19 | Discharge: 2023-10-22 | DRG: 196 | Disposition: A | Discharge status: Home Health | Attending: Family Medicine | Admitting: Family Medicine

## 2023-10-19 DIAGNOSIS — G4700 Insomnia, unspecified: Secondary | ICD-10-CM | POA: Diagnosis present

## 2023-10-19 DIAGNOSIS — Z905 Acquired absence of kidney: Secondary | ICD-10-CM

## 2023-10-19 DIAGNOSIS — Z743 Need for continuous supervision: Secondary | ICD-10-CM | POA: Diagnosis not present

## 2023-10-19 DIAGNOSIS — F32A Depression, unspecified: Secondary | ICD-10-CM | POA: Diagnosis present

## 2023-10-19 DIAGNOSIS — J160 Chlamydial pneumonia: Secondary | ICD-10-CM | POA: Diagnosis present

## 2023-10-19 DIAGNOSIS — J849 Interstitial pulmonary disease, unspecified: Secondary | ICD-10-CM | POA: Diagnosis present

## 2023-10-19 DIAGNOSIS — I110 Hypertensive heart disease with heart failure: Secondary | ICD-10-CM | POA: Diagnosis present

## 2023-10-19 DIAGNOSIS — Z8546 Personal history of malignant neoplasm of prostate: Secondary | ICD-10-CM | POA: Diagnosis not present

## 2023-10-19 DIAGNOSIS — E039 Hypothyroidism, unspecified: Secondary | ICD-10-CM | POA: Diagnosis present

## 2023-10-19 DIAGNOSIS — J90 Pleural effusion, not elsewhere classified: Secondary | ICD-10-CM | POA: Diagnosis not present

## 2023-10-19 DIAGNOSIS — G629 Polyneuropathy, unspecified: Secondary | ICD-10-CM | POA: Diagnosis present

## 2023-10-19 DIAGNOSIS — U099 Post covid-19 condition, unspecified: Secondary | ICD-10-CM | POA: Diagnosis not present

## 2023-10-19 DIAGNOSIS — R0609 Other forms of dyspnea: Secondary | ICD-10-CM | POA: Diagnosis present

## 2023-10-19 DIAGNOSIS — Z9981 Dependence on supplemental oxygen: Secondary | ICD-10-CM

## 2023-10-19 DIAGNOSIS — I2510 Atherosclerotic heart disease of native coronary artery without angina pectoris: Secondary | ICD-10-CM | POA: Diagnosis not present

## 2023-10-19 DIAGNOSIS — Z85528 Personal history of other malignant neoplasm of kidney: Secondary | ICD-10-CM

## 2023-10-19 DIAGNOSIS — J189 Pneumonia, unspecified organism: Principal | ICD-10-CM

## 2023-10-19 DIAGNOSIS — Z7951 Long term (current) use of inhaled steroids: Secondary | ICD-10-CM | POA: Diagnosis not present

## 2023-10-19 DIAGNOSIS — J9611 Chronic respiratory failure with hypoxia: Secondary | ICD-10-CM | POA: Diagnosis present

## 2023-10-19 DIAGNOSIS — J449 Chronic obstructive pulmonary disease, unspecified: Secondary | ICD-10-CM | POA: Diagnosis not present

## 2023-10-19 DIAGNOSIS — E66811 Obesity, class 1: Secondary | ICD-10-CM | POA: Diagnosis present

## 2023-10-19 DIAGNOSIS — F419 Anxiety disorder, unspecified: Secondary | ICD-10-CM | POA: Diagnosis present

## 2023-10-19 DIAGNOSIS — J8410 Pulmonary fibrosis, unspecified: Principal | ICD-10-CM | POA: Diagnosis present

## 2023-10-19 DIAGNOSIS — Z79899 Other long term (current) drug therapy: Secondary | ICD-10-CM | POA: Diagnosis not present

## 2023-10-19 DIAGNOSIS — Z8701 Personal history of pneumonia (recurrent): Secondary | ICD-10-CM

## 2023-10-19 DIAGNOSIS — G4733 Obstructive sleep apnea (adult) (pediatric): Secondary | ICD-10-CM | POA: Diagnosis present

## 2023-10-19 DIAGNOSIS — G8929 Other chronic pain: Secondary | ICD-10-CM | POA: Diagnosis not present

## 2023-10-19 DIAGNOSIS — I5033 Acute on chronic diastolic (congestive) heart failure: Secondary | ICD-10-CM | POA: Diagnosis present

## 2023-10-19 DIAGNOSIS — R6889 Other general symptoms and signs: Secondary | ICD-10-CM | POA: Diagnosis not present

## 2023-10-19 DIAGNOSIS — R918 Other nonspecific abnormal finding of lung field: Secondary | ICD-10-CM | POA: Diagnosis not present

## 2023-10-19 DIAGNOSIS — Z96653 Presence of artificial knee joint, bilateral: Secondary | ICD-10-CM | POA: Diagnosis present

## 2023-10-19 DIAGNOSIS — I4891 Unspecified atrial fibrillation: Secondary | ICD-10-CM | POA: Diagnosis not present

## 2023-10-19 DIAGNOSIS — R0602 Shortness of breath: Secondary | ICD-10-CM | POA: Diagnosis not present

## 2023-10-19 DIAGNOSIS — J984 Other disorders of lung: Secondary | ICD-10-CM | POA: Diagnosis not present

## 2023-10-19 LAB — COMPREHENSIVE METABOLIC PANEL WITH GFR
ALT: 18 U/L (ref 0–44)
AST: 25 U/L (ref 15–41)
Albumin: 3 g/dL — ABNORMAL LOW (ref 3.5–5.0)
Alkaline Phosphatase: 68 U/L (ref 38–126)
Anion gap: 4 — ABNORMAL LOW (ref 5–15)
BUN: 13 mg/dL (ref 8–23)
CO2: 28 mmol/L (ref 22–32)
Calcium: 8.4 mg/dL — ABNORMAL LOW (ref 8.9–10.3)
Chloride: 106 mmol/L (ref 98–111)
Creatinine, Ser: 0.86 mg/dL (ref 0.61–1.24)
GFR, Estimated: >60 mL/min (ref >60)
Glucose, Bld: 96 mg/dL (ref 70–99)
Potassium: 4.2 mmol/L (ref 3.5–5.1)
Sodium: 138 mmol/L (ref 135–145)
Total Bilirubin: 0.8 mg/dL (ref 0.0–1.2)
Total Protein: 7.4 g/dL (ref 6.5–8.1)

## 2023-10-19 LAB — CBC WITH DIFFERENTIAL/PLATELET
Abs Immature Granulocytes: 0.02 10*3/uL (ref 0.00–0.07)
Basophils Absolute: 0 10*3/uL (ref 0.0–0.1)
Basophils Relative: 0 %
Eosinophils Absolute: 0.1 10*3/uL (ref 0.0–0.5)
Eosinophils Relative: 2 %
HCT: 40.8 % (ref 39.0–52.0)
Hemoglobin: 12.7 g/dL — ABNORMAL LOW (ref 13.0–17.0)
Immature Granulocytes: 0 %
Lymphocytes Relative: 13 %
Lymphs Abs: 0.9 10*3/uL (ref 0.7–4.0)
MCH: 29.8 pg (ref 26.0–34.0)
MCHC: 31.1 g/dL (ref 30.0–36.0)
MCV: 95.8 fL (ref 80.0–100.0)
Monocytes Absolute: 0.6 10*3/uL (ref 0.1–1.0)
Monocytes Relative: 9 %
Neutro Abs: 5.2 10*3/uL (ref 1.7–7.7)
Neutrophils Relative %: 76 %
Platelets: 243 10*3/uL (ref 150–400)
RBC: 4.26 MIL/uL (ref 4.22–5.81)
RDW: 14.6 % (ref 11.5–15.5)
WBC: 6.9 10*3/uL (ref 4.0–10.5)
nRBC: 0 % (ref 0.0–0.2)

## 2023-10-19 LAB — RESP PANEL BY RT-PCR (RSV, FLU A&B, COVID)  RVPGX2
Influenza A by PCR: NEGATIVE
Influenza B by PCR: NEGATIVE
Resp Syncytial Virus by PCR: NEGATIVE
SARS Coronavirus 2 by RT PCR: NEGATIVE

## 2023-10-19 LAB — TROPONIN I (HIGH SENSITIVITY)
Troponin I (High Sensitivity): 5 ng/L (ref <18)
Troponin I (High Sensitivity): 5 ng/L (ref <18)

## 2023-10-19 LAB — BRAIN NATRIURETIC PEPTIDE: B Natriuretic Peptide: 119.8 pg/mL — ABNORMAL HIGH (ref 0.0–100.0)

## 2023-10-19 MED ORDER — SODIUM CHLORIDE 0.9 % IV SOLN: 2.0000 g | INTRAVENOUS | Status: DC

## 2023-10-19 MED ORDER — GABAPENTIN 300 MG PO CAPS
300.0000 mg | ORAL_CAPSULE | Freq: Three times a day (TID) | ORAL | Status: DC
  Administered 2023-10-20 – 2023-10-22 (×7): 300 mg via ORAL
  Filled 2023-10-19 (×7): qty 1

## 2023-10-19 MED ORDER — FUROSEMIDE 10 MG/ML IJ SOLN
40.0000 mg | Freq: Once | INTRAMUSCULAR | Status: AC
  Administered 2023-10-20: 40 mg via INTRAVENOUS
  Filled 2023-10-19: qty 4

## 2023-10-19 MED ORDER — SODIUM CHLORIDE 0.9 % IV SOLN
2.0000 g | Freq: Once | INTRAVENOUS | Status: AC
  Administered 2023-10-19: 2 g via INTRAVENOUS
  Filled 2023-10-19: qty 20

## 2023-10-19 MED ORDER — AZITHROMYCIN 250 MG PO TABS: 500.0000 mg | ORAL_TABLET | Freq: Every day | ORAL | Status: DC

## 2023-10-19 MED ORDER — ESCITALOPRAM OXALATE 20 MG PO TABS
20.0000 mg | ORAL_TABLET | Freq: Every day | ORAL | Status: DC
  Administered 2023-10-20 – 2023-10-22 (×3): 20 mg via ORAL
  Filled 2023-10-19 (×3): qty 1

## 2023-10-19 MED ORDER — SODIUM CHLORIDE 0.9 % IV SOLN
500.0000 mg | Freq: Once | INTRAVENOUS | Status: AC
  Administered 2023-10-19: 500 mg via INTRAVENOUS
  Filled 2023-10-19: qty 5

## 2023-10-19 MED ORDER — TRAZODONE HCL 50 MG PO TABS
50.0000 mg | ORAL_TABLET | Freq: Every day | ORAL | Status: DC
  Administered 2023-10-19 – 2023-10-21 (×3): 50 mg via ORAL
  Filled 2023-10-19 (×3): qty 1

## 2023-10-19 NOTE — ED Notes (Signed)
 ED TO INPATIENT HANDOFF REPORT  ED Nurse Name and Phone #: Lodema Rimes RN  S Name/Age/Gender Wayne Booth 83 y.o. male Room/Bed: WA01/WA01  Code Status   Code Status: Prior  Home/SNF/Other Home Patient oriented to: self, place, time, and situation Is this baseline? Yes   Triage Complete: Triage complete  Chief Complaint Community acquired pneumonia due to Chlamydia species [J16.0]  Triage Note Bib by [facility] from home for shortness of breath x 2 days. Patient on home oxygen  at 5 Lpm per nasal cannula. Patient appears awake, alert and oriented. Shortness of breath noted, cannula maintained at 5 LPM [place]. Peripheral line started by EMS at right forearm g 20. Per EMS, pt had albuterol  breathing treatment prior to their arrival, EMS gave Solu-medrol  125 mg IV. Patent has shob when he stands-up, pt sleeps on a reclining chair at home. Pt has prescribed Lasix  PRN for swelling and has not taken the medication in awhile per wife.    Allergies Allergies  Allergen Reactions   Ambien  [Zolpidem ] Anxiety and Other (See Comments)    Anxiety and agitation when tried as sleeper in hospital aug 2019    Level of Care/Admitting Diagnosis ED Disposition     ED Disposition  Admit   Condition  --   Comment  Hospital Area: [facility] [[id number]]  Level of Care: Telemetry [5]  Admit to tele based on following criteria: Eval of Syncope  May admit patient to [facility] or [facility] if equivalent level of care is available:: No  Covid Evaluation: Asymptomatic - no recent exposure (last 10 days) testing not required  Diagnosis: Community acquired pneumonia due to Chlamydia species [[id number]]  Admitting Physician: Myrl Askew [[id number]]  Attending Physician: Myrl Askew [[id number]]  Certification:: I certify this patient will need inpatient services for at least 2 midnights  Expected Medical Readiness: 10/21/2023          B Medical/Surgery History Past  Medical History:  Diagnosis Date   Allergy    States his nose runs when he is around grease.    Anxiety    Arthritis    Atrial fibrillation (HCC)    Cancer (HCC)    Coronary artery disease    History of total knee replacement    Hypertension    Hypothyroidism    Kidney cancer, primary, with metastasis from kidney to other site [facility])    Kidney stone    OSA (obstructive sleep apnea)    pt does not wear cpap at night   Pituitary cyst (HCC)    Pneumonia    hx of   Prostate cancer [facility])    Past Surgical History:  Procedure Laterality Date   ABLATION OF DYSRHYTHMIC FOCUS  01/28/2018   ANTERIOR CERVICAL DECOMP/DISCECTOMY FUSION N/A 08/29/2012   Procedure: ANTERIOR CERVICAL DECOMPRESSION/DISCECTOMY FUSION 1 LEVEL;  Surgeon: Isadora Mar, MD;  Location: [facility] NEURO ORS;  Service: Neurosurgery;  Laterality: N/A;   APPENDECTOMY     ATRIAL FIBRILLATION ABLATION N/A 01/28/2018   Procedure: ATRIAL FIBRILLATION ABLATION;  Surgeon: Lei Pump, MD;  Location: [facility] INVASIVE CV LAB;  Service: Cardiovascular;  Laterality: N/A;   CARDIAC CATHETERIZATION  2008   clean   CHOLECYSTECTOMY     COLONOSCOPY W/ POLYPECTOMY     CRANIOTOMY N/A 11/08/2012   Procedure: CRANIOTOMY HYPOPHYSECTOMY TRANSNASAL APPROACH;  Surgeon: Augustine Blocker, MD;  Location: [facility] NEURO ORS;  Service: Neurosurgery;  Laterality: N/A;  Transphenoidal Resection of Pituitary Tumor   FINGER SURGERY Left  2017   Dr Aloha Arnold   INSERTION PROSTATE RADIATION SEED     IR THORACENTESIS ASP PLEURAL SPACE W/IMG GUIDE  04/15/2018   KIDNEY STONE SURGERY     KNEE ARTHROSCOPY  2007   left   NEPHRECTOMY Left    POSTERIOR CERVICAL LAMINECTOMY Left 04/24/2015   Procedure: Foraminotomy cervical five - cervical six cervical six - cervical seven left;  Surgeon: Isadora Mar, MD;  Location: [facility] NEURO ORS;  Service: Neurosurgery;  Laterality: Left;   REPLACEMENT TOTAL KNEE BILATERAL     RIGHT/LEFT HEART CATH AND CORONARY ANGIOGRAPHY N/A 07/14/2019    Procedure: RIGHT/LEFT HEART CATH AND CORONARY ANGIOGRAPHY;  Surgeon: Lucendia Rusk, MD;  Location: [facility] INVASIVE CV LAB;  Service: Cardiovascular;  Laterality: N/A;     A IV Location/Drains/Wounds Patient Lines/Drains/Airways Status     Active Line/Drains/Airways     Name Placement date Placement time Site Days   Peripheral IV 10/19/23 20 G Anterior;Right Forearm 10/19/23  1512  Forearm  less than 1            Intake/Output Last 24 hours  Intake/Output Summary (Last 24 hours) at 10/19/2023 1954 Last data filed at 10/19/2023 1820 Gross per 24 hour  Intake 350 ml  Output --  Net 350 ml    Labs/Imaging Results for orders placed or performed during the hospital encounter of 10/19/23 (from the past 48 hours)  CBC with Differential     Status: Abnormal   Collection Time: 10/19/23  3:28 PM  Result Value Ref Range   WBC 6.9 4.0 - 10.5 K/uL   RBC 4.26 4.22 - 5.81 MIL/uL   Hemoglobin 12.7 (L) 13.0 - 17.0 g/dL   HCT 09.8 11.9 - 14.7 %   MCV 95.8 80.0 - 100.0 fL   MCH 29.8 26.0 - 34.0 pg   MCHC 31.1 30.0 - 36.0 g/dL   RDW 82.9 56.2 - 13.0 %   Platelets 243 150 - 400 K/uL   nRBC 0.0 0.0 - 0.2 %   Neutrophils Relative % 76 %   Neutro Abs 5.2 1.7 - 7.7 K/uL   Lymphocytes Relative 13 %   Lymphs Abs 0.9 0.7 - 4.0 K/uL   Monocytes Relative 9 %   Monocytes Absolute 0.6 0.1 - 1.0 K/uL   Eosinophils Relative 2 %   Eosinophils Absolute 0.1 0.0 - 0.5 K/uL   Basophils Relative 0 %   Basophils Absolute 0.0 0.0 - 0.1 K/uL   Immature Granulocytes 0 %   Abs Immature Granulocytes 0.02 0.00 - 0.07 K/uL    Comment: Performed at [facility] [address] [place], [place] [address]  Comprehensive metabolic panel     Status: Abnormal   Collection Time: 10/19/23  3:28 PM  Result Value Ref Range   Sodium 138 135 - 145 mmol/L   Potassium 4.2 3.5 - 5.1 mmol/L   Chloride 106 98 - 111 mmol/L   CO2 28 22 - 32 mmol/L   Glucose, Bld 96 70 - 99 mg/dL    Comment: Glucose  reference range applies only to samples taken after fasting for at least 8 hours.   BUN 13 8 - 23 mg/dL   Creatinine, Ser 4.69 0.61 - 1.24 mg/dL   Calcium 8.4 (L) 8.9 - 10.3 mg/dL   Total Protein 7.4 6.5 - 8.1 g/dL   Albumin  3.0 (L) 3.5 - 5.0 g/dL   AST 25 15 - 41 U/L   ALT 18 0 - 44 U/L  Alkaline Phosphatase 68 38 - 126 U/L   Total Bilirubin 0.8 0.0 - 1.2 mg/dL   GFR, Estimated >56 >21 mL/min    Comment: (NOTE) Calculated using the CKD-EPI Creatinine Equation (2021)    Anion gap 4 (L) 5 - 15    Comment: Performed at [facility], [address] [place], [place] [address]  Troponin I (High Sensitivity)     Status: None   Collection Time: 10/19/23  3:28 PM  Result Value Ref Range   Troponin I (High Sensitivity) 5 <18 ng/L    Comment: (NOTE) Elevated high sensitivity troponin I (hsTnI) values and significant  changes across serial measurements may suggest ACS but many other  chronic and acute conditions are known to elevate hsTnI results.  Refer to the "Links" section for chest pain algorithms and additional  guidance. Performed at [facility], [address] [place], [place] [address]   Brain natriuretic peptide     Status: Abnormal   Collection Time: 10/19/23  3:28 PM  Result Value Ref Range   B Natriuretic Peptide 119.8 (H) 0.0 - 100.0 pg/mL    Comment: Performed at [facility], [address] [place], [place] [address]  Resp panel by RT-PCR (RSV, Flu A&B, Covid) Anterior Nasal Swab     Status: None   Collection Time: 10/19/23  3:33 PM   Specimen: Anterior Nasal Swab  Result Value Ref Range   SARS Coronavirus 2 by RT PCR NEGATIVE NEGATIVE    Comment: (NOTE) SARS-CoV-2 target nucleic acids are NOT DETECTED.  The SARS-CoV-2 RNA is generally detectable in upper respiratory specimens during the acute phase of infection. The lowest concentration of SARS-CoV-2 viral copies this assay can detect is 138 copies/mL. A  negative result does not preclude SARS-Cov-2 infection and should not be used as the sole basis for treatment or other patient management decisions. A negative result may occur with  improper specimen collection/handling, submission of specimen other than nasopharyngeal swab, presence of viral mutation(s) within the areas targeted by this assay, and inadequate number of viral copies(<138 copies/mL). A negative result must be combined with clinical observations, patient history, and epidemiological information. The expected result is Negative.  Fact Sheet for Patients:  BloggerCourse.com  Fact Sheet for Healthcare Providers:  SeriousBroker.it  This test is no t yet approved or cleared by the [place]  FDA and  has been authorized for detection and/or diagnosis of SARS-CoV-2 by FDA under an Emergency Use Authorization (EUA). This EUA will remain  in effect (meaning this test can be used) for the duration of the COVID-19 declaration under Section 564(b)(1) of the Act, 21 U.S.C.section 360bbb-3(b)(1), unless the authorization is terminated  or revoked sooner.       Influenza A by PCR NEGATIVE NEGATIVE   Influenza B by PCR NEGATIVE NEGATIVE    Comment: (NOTE) The Xpert Xpress SARS-CoV-2/FLU/RSV plus assay is intended as an aid in the diagnosis of influenza from Nasopharyngeal swab specimens and should not be used as a sole basis for treatment. Nasal washings and aspirates are unacceptable for Xpert Xpress SARS-CoV-2/FLU/RSV testing.  Fact Sheet for Patients: BloggerCourse.com  Fact Sheet for Healthcare Providers: SeriousBroker.it  This test is not yet approved or cleared by the [place]  FDA and has been authorized for detection and/or diagnosis of SARS-CoV-2 by FDA under an Emergency Use Authorization (EUA). This EUA will remain in effect (meaning this test can be used)  for the duration of the COVID-19 declaration under Section  564(b)(1) of the Act, 21 U.S.C. section 360bbb-3(b)(1), unless the authorization is terminated or revoked.     Resp Syncytial Virus by PCR NEGATIVE NEGATIVE    Comment: (NOTE) Fact Sheet for Patients: BloggerCourse.com  Fact Sheet for Healthcare Providers: SeriousBroker.it  This test is not yet approved or cleared by the [place]  FDA and has been authorized for detection and/or diagnosis of SARS-CoV-2 by FDA under an Emergency Use Authorization (EUA). This EUA will remain in effect (meaning this test can be used) for the duration of the COVID-19 declaration under Section 564(b)(1) of the Act, 21 U.S.C. section 360bbb-3(b)(1), unless the authorization is terminated or revoked.  Performed at [facility], [address] [place], [place] [address]   Troponin I (High Sensitivity)     Status: None   Collection Time: 10/19/23  5:34 PM  Result Value Ref Range   Troponin I (High Sensitivity) 5 <18 ng/L    Comment: (NOTE) Elevated high sensitivity troponin I (hsTnI) values and significant  changes across serial measurements may suggest ACS but many other  chronic and acute conditions are known to elevate hsTnI results.  Refer to the "Links" section for chest pain algorithms and additional  guidance. Performed at [facility], [address], [place], [place] [address]    *Note: Due to a large number of results and/or encounters for the requested time period, some results have not been displayed. A complete set of results can be found in Results Review.   DG Chest 2 View Result Date: 10/19/2023 CLINICAL DATA:  Shortness of breath for the past 2 days. EXAM: CHEST - 2 VIEW COMPARISON:  09/13/2023 FINDINGS: A poor inspiration is again demonstrated. No gross change in enlargement of the cardiac silhouette. Tortuous and partially calcified  thoracic aorta. Interval mild patchy density throughout the right lung with no significant change in a small right pleural effusion. Dense left lower lobe opacity without significant change. Probable small left pleural effusion. Thoracic spine degenerative changes. Cholecystectomy clips. IMPRESSION: 1. Interval mild patchy density throughout the right lung, suspicious for pneumonia. 2. No significant change in dense left lower lobe opacity, which could represent atelectasis or pneumonia. 3. No significant change in cardiomegaly and small bilateral pleural effusions. Electronically Signed   By: Catherin Closs M.D.   On: 10/19/2023 16:18    Pending Labs Unresulted Labs (From admission, onward)    None       Vitals/Pain Today's Vitals   10/19/23 1630 10/19/23 1700 10/19/23 1900 10/19/23 1923  BP: 128/66 126/69 129/69   Pulse: 67 71 91   Resp: (!) 28 (!) 31 (!) 35   Temp:    98.4 F (36.9 C)  TempSrc:    Axillary  SpO2: 100% 99% 98%   Weight:      Height:      PainSc:        Isolation Precautions No active isolations  Medications Medications  cefTRIAXone  (ROCEPHIN ) 2 g in sodium chloride  0.9 % 100 mL IVPB (0 g Intravenous Stopped 10/19/23 1716)  azithromycin  (ZITHROMAX ) 500 mg in sodium chloride  0.9 % 250 mL IVPB (0 mg Intravenous Stopped 10/19/23 1820)    Mobility Normally walks but with current condition patient gets [facility] easily, on oxygen      Focused Assessments Pulmonary Assessment Handoff:  Lung sounds:   O2 Device: Nasal Cannula O2 Flow Rate (L/min): 5 L/min    R Recommendations: See Admitting Provider Note  Report given to:   Additional Notes:

## 2023-10-19 NOTE — ED Provider Notes (Signed)
 [place] EMERGENCY DEPARTMENT AT [facility] Provider Note   CSN: [id number] Arrival date & time: 10/19/23  1459     History  Chief Complaint  Patient presents with   Shortness of Breath    Wayne Booth is a 83 y.o. male.   Shortness of Breath Patient shortness of breath.  History of chronic oxygen  use.  States more short of breath for the last few days.  Rare cough.  Had been given breathing treatment and albuterol  by EMS.  Baseline somewhat limited in activity but states that he could not be able to walk right now.  History of CHF also but states he feels a doing well fluid wise.    Past Medical History:  Diagnosis Date   Allergy    States his nose runs when he is around grease.    Anxiety    Arthritis    Atrial fibrillation (HCC)    Cancer (HCC)    Coronary artery disease    History of total knee replacement    Hypertension    Hypothyroidism    Kidney cancer, primary, with metastasis from kidney to other site [facility])    Kidney stone    OSA (obstructive sleep apnea)    pt does not wear cpap at night   Pituitary cyst (HCC)    Pneumonia    hx of   Prostate cancer (HCC)     Home Medications Prior to Admission medications   Medication Sig Start Date End Date Taking? Authorizing Provider  acetaminophen  (TYLENOL ) 325 MG tablet Take 2 tablets (650 mg total) by mouth every 6 (six) hours as needed for mild pain (pain score 1-3) (or Fever >/= 101). 09/17/23   Barbee Lew, MD  albuterol  (PROVENTIL ) (2.5 MG/3ML) 0.083% nebulizer solution Take 3 mLs (2.5 mg total) by nebulization every 6 (six) hours as needed for wheezing or shortness of breath. 10/12/22   Parrett, Macdonald Savoy, NP  albuterol  (VENTOLIN  HFA) 108 (90 Base) MCG/ACT inhaler INHALE 2 PUFFS BY MOUTH EVERY 6 HOURS AS NEEDED FOR WHEEZING OR SHORTNESS OF BREATH Patient taking differently: Inhale 2 puffs into the lungs every 6 (six) hours as needed for wheezing or shortness of breath. 07/13/23   Parrett,  Macdonald Savoy, NP  budesonide  (PULMICORT ) 0.25 MG/2ML nebulizer solution Take 2 mLs (0.25 mg total) by nebulization 2 (two) times daily. Patient taking differently: Take 0.25 mg by nebulization as needed (wheezing and SOB). 09/07/23   Leona Rake, MD  escitalopram  (LEXAPRO ) 20 MG tablet TAKE 1 TABLET BY MOUTH DAILY 06/07/23   Almira Jaeger, MD  furosemide  (LASIX ) 20 MG tablet Take 1 tablet (20 mg total) by mouth daily. Patient taking differently: Take 20 mg by mouth daily as needed for fluid or edema. 09/08/23   Leona Rake, MD  gabapentin  (NEURONTIN ) 300 MG capsule Take 1 capsule (300 mg total) by mouth 3 (three) times daily. 12/25/22   Almira Jaeger, MD  ipratropium-albuterol  (DUONEB) 0.5-2.5 (3) MG/3ML SOLN Take 3 mLs by nebulization every 6 (six) hours as needed. Patient taking differently: Take 3 mLs by nebulization every 6 (six) hours as needed (wheezing/ SOB). 09/07/23   Leona Rake, MD  LORazepam  (ATIVAN ) 0.5 MG tablet Take 1 tablet (0.5 mg total) by mouth daily as needed for anxiety (do not take within 8 hours of bed. do not drive for 8 hours after taking.). 04/30/23   Almira Jaeger, MD  ondansetron  (ZOFRAN ) 4 MG tablet Take 1 tablet (4 mg total) by  mouth every 6 (six) hours as needed for nausea. 09/17/23   Barbee Lew, MD  traMADol  (ULTRAM ) 50 MG tablet Take 1 tablet (50 mg total) by mouth every 6 (six) hours as needed (prefer max 3 per day). Patient taking differently: Take 50 mg by mouth every 6 (six) hours as needed for moderate pain (pain score 4-6) or severe pain (pain score 7-10) (prefer max 3 per day). 01/30/22   Rodney Clamp, MD  traZODone  (DESYREL ) 50 MG tablet TAKE 1/2 TO 1 TABLET BY MOUTH EVERY NIGHT AT BEDTIME AS NEEDED FOR SLEEP 10/11/23   Christel Cousins, MD      Allergies    Ambien  [zolpidem ]    Review of Systems   Review of Systems  Respiratory:  Positive for shortness of breath.     Physical Exam Updated Vital Signs BP 126/69   Pulse 71    Temp 97.9 F (36.6 C) (Oral)   Resp (!) 31   Ht 5\' 11"  (1.803 m)   Wt 101.6 kg   SpO2 99%   BMI 31.24 kg/m  Physical Exam Vitals and nursing note reviewed.  Cardiovascular:     Rate and Rhythm: Normal rate.  Pulmonary:     Comments: Harsh breath sounds without focal rales or rhonchi. Chest:     Chest wall: No tenderness.  Abdominal:     Tenderness: There is no abdominal tenderness.  Musculoskeletal:     Right lower leg: No edema.     Left lower leg: No edema.  Neurological:     Mental Status: He is alert.     ED Results / Procedures / Treatments   Labs (all labs ordered are listed, but only abnormal results are displayed) Labs Reviewed  CBC WITH DIFFERENTIAL/PLATELET - Abnormal; Notable for the following components:      Result Value   Hemoglobin 12.7 (*)    All other components within normal limits  COMPREHENSIVE METABOLIC PANEL WITH GFR - Abnormal; Notable for the following components:   Calcium 8.4 (*)    Albumin  3.0 (*)    Anion gap 4 (*)    All other components within normal limits  BRAIN NATRIURETIC PEPTIDE - Abnormal; Notable for the following components:   B Natriuretic Peptide 119.8 (*)    All other components within normal limits  RESP PANEL BY RT-PCR (RSV, FLU A&B, COVID)  [id number]  TROPONIN I (HIGH SENSITIVITY)  TROPONIN I (HIGH SENSITIVITY)    EKG None  Radiology DG Chest 2 View Result Date: 10/19/2023 CLINICAL DATA:  Shortness of breath for the past 2 days. EXAM: CHEST - 2 VIEW COMPARISON:  09/13/2023 FINDINGS: A poor inspiration is again demonstrated. No gross change in enlargement of the cardiac silhouette. Tortuous and partially calcified thoracic aorta. Interval mild patchy density throughout the right lung with no significant change in a small right pleural effusion. Dense left lower lobe opacity without significant change. Probable small left pleural effusion. Thoracic spine degenerative changes. Cholecystectomy clips. IMPRESSION: 1. Interval mild  patchy density throughout the right lung, suspicious for pneumonia. 2. No significant change in dense left lower lobe opacity, which could represent atelectasis or pneumonia. 3. No significant change in cardiomegaly and small bilateral pleural effusions. Electronically Signed   By: Catherin Closs M.D.   On: 10/19/2023 16:18    Procedures Procedures    Medications Ordered in ED Medications  cefTRIAXone  (ROCEPHIN ) 2 g in sodium chloride  0.9 % 100 mL IVPB (0 g Intravenous Stopped 10/19/23 1716)  azithromycin  (  ZITHROMAX ) 500 mg in sodium chloride  0.9 % 250 mL IVPB (0 mg Intravenous Stopped 10/19/23 1820)    ED Course/ Medical Decision Making/ A&P                                 Medical Decision Making Amount and/or Complexity of Data Reviewed Labs: ordered. Radiology: ordered.  Patient shortness of breath and cough.  History of COPD.  Differential diagnose includes COPD but also with cough cause such as pneumonia.  X-ray does show potential right sided pneumonia.  Will give antibiotics at this time.  Will likely require admission to hospital.  Patient has been more dyspneic.  Does have tachypnea.  Antibiotics have been given and will discuss with hospitalist for admission.           Final Clinical Impression(s) / ED Diagnoses Final diagnoses:  Community acquired pneumonia of right lung, unspecified part of lung    Rx / DC Orders ED Discharge Orders     None         Mozell Arias, MD 10/19/23 1840

## 2023-10-19 NOTE — ED Notes (Signed)
 Pt provided with meal tray and soda.

## 2023-10-19 NOTE — H&P (Signed)
 History and Physical    Wayne Booth WUJ:[id number] DOB: [birth year] DOA: 10/19/2023  PCP: Almira Jaeger, MD   Chief Complaint:  sob  HPI: Wayne Booth is a 83 y.o. male with medical history significant of CAD, A-fib, hypothyroidism, hypertension who presented to the emergency department due to shortness of breath.  Patient's had 3 days of progressively worsening shortness of breath in setting of history of CHF.  He uses 2 L of oxygen  at baseline.  He states that he has been more short of breath over the last day and has been unable to catch his breath.  He presented to the emergency department where he was found to be afebrile and hemodynamically stable.  He was borderline hypoxic and required increased oxygen  requirement to 5 L.  Labs were obtained on presentation which showed WBC 6.9, hemoglobin 12.7, CMP unrevealing, troponin 5, BNP 119 on admission patient appeared mildly hypervolemic.  He was given IV Lasix .  He endorsed productive cough and shortness of breath.  He typically uses inhalers albuterol  at home   Review of Systems: Review of Systems  Constitutional: Negative.  Negative for chills and fever.  HENT: Negative.    Eyes: Negative.   Respiratory:  Positive for shortness of breath and wheezing.   Cardiovascular: Negative.   Gastrointestinal: Negative.   Genitourinary: Negative.   Musculoskeletal: Negative.   Skin: Negative.   Neurological: Negative.   Endo/Heme/Allergies: Negative.   Psychiatric/Behavioral: Negative.       As per HPI otherwise 10 point review of systems negative.   Allergies  Allergen Reactions   Ambien  [Zolpidem ] Anxiety and Other (See Comments)    Anxiety and agitation when tried as sleeper in hospital aug 2019    Past Medical History:  Diagnosis Date   Allergy    States his nose runs when he is around grease.    Anxiety    Arthritis    Atrial fibrillation (HCC)    Cancer (HCC)    Coronary artery disease    History of total knee  replacement    Hypertension    Hypothyroidism    Kidney cancer, primary, with metastasis from kidney to other site [facility])    Kidney stone    OSA (obstructive sleep apnea)    pt does not wear cpap at night   Pituitary cyst (HCC)    Pneumonia    hx of   Prostate cancer [facility])     Past Surgical History:  Procedure Laterality Date   ABLATION OF DYSRHYTHMIC FOCUS  01/28/2018   ANTERIOR CERVICAL DECOMP/DISCECTOMY FUSION N/A 08/29/2012   Procedure: ANTERIOR CERVICAL DECOMPRESSION/DISCECTOMY FUSION 1 LEVEL;  Surgeon: Isadora Mar, MD;  Location: [facility] NEURO ORS;  Service: Neurosurgery;  Laterality: N/A;   APPENDECTOMY     ATRIAL FIBRILLATION ABLATION N/A 01/28/2018   Procedure: ATRIAL FIBRILLATION ABLATION;  Surgeon: Lei Pump, MD;  Location: [facility] INVASIVE CV LAB;  Service: Cardiovascular;  Laterality: N/A;   CARDIAC CATHETERIZATION  2008   clean   CHOLECYSTECTOMY     COLONOSCOPY W/ POLYPECTOMY     CRANIOTOMY N/A 11/08/2012   Procedure: CRANIOTOMY HYPOPHYSECTOMY TRANSNASAL APPROACH;  Surgeon: Augustine Blocker, MD;  Location: [facility] NEURO ORS;  Service: Neurosurgery;  Laterality: N/A;  Transphenoidal Resection of Pituitary Tumor   FINGER SURGERY Left 2017   Dr Aloha Arnold   INSERTION PROSTATE RADIATION SEED     IR THORACENTESIS ASP PLEURAL SPACE W/IMG GUIDE  04/15/2018   KIDNEY STONE SURGERY     KNEE  ARTHROSCOPY  2007   left   NEPHRECTOMY Left    POSTERIOR CERVICAL LAMINECTOMY Left 04/24/2015   Procedure: Foraminotomy cervical five - cervical six cervical six - cervical seven left;  Surgeon: Isadora Mar, MD;  Location: [facility] NEURO ORS;  Service: Neurosurgery;  Laterality: Left;   REPLACEMENT TOTAL KNEE BILATERAL     RIGHT/LEFT HEART CATH AND CORONARY ANGIOGRAPHY N/A 07/14/2019   Procedure: RIGHT/LEFT HEART CATH AND CORONARY ANGIOGRAPHY;  Surgeon: Lucendia Rusk, MD;  Location: [facility] INVASIVE CV LAB;  Service: Cardiovascular;  Laterality: N/A;     reports that he has never smoked. He has never  used smokeless tobacco. He reports current alcohol  use of about 2.0 standard drinks of alcohol  per week. He reports that he does not use drugs.  Family History  Problem Relation Age of Onset   Cancer Mother        stomach, died when he was young   Cancer Father        ?mouth, died before patient born   Kidney cancer Sister    Colon cancer Brother 32   Gastric cancer Son 38       stomach cancer per pt   Esophageal cancer Neg Hx    Rectal cancer Neg Hx    Stomach cancer Neg Hx     Prior to Admission medications   Medication Sig Start Date End Date Taking? Authorizing Provider  acetaminophen  (TYLENOL ) 325 MG tablet Take 2 tablets (650 mg total) by mouth every 6 (six) hours as needed for mild pain (pain score 1-3) (or Fever >/= 101). 09/17/23  Yes Barbee Lew, MD  albuterol  (PROVENTIL ) (2.5 MG/3ML) 0.083% nebulizer solution Take 3 mLs (2.5 mg total) by nebulization every 6 (six) hours as needed for wheezing or shortness of breath. 10/12/22  Yes Parrett, Tammy S, NP  albuterol  (VENTOLIN  HFA) 108 (90 Base) MCG/ACT inhaler INHALE 2 PUFFS BY MOUTH EVERY 6 HOURS AS NEEDED FOR WHEEZING OR SHORTNESS OF BREATH Patient taking differently: Inhale 2 puffs into the lungs every 6 (six) hours as needed for wheezing or shortness of breath. 07/13/23  Yes Parrett, Tammy S, NP  budesonide  (PULMICORT ) 0.25 MG/2ML nebulizer solution Take 2 mLs (0.25 mg total) by nebulization 2 (two) times daily. Patient taking differently: Take 0.25 mg by nebulization in the morning and at bedtime. 09/07/23  Yes Leona Rake, MD  escitalopram  (LEXAPRO ) 20 MG tablet TAKE 1 TABLET BY MOUTH DAILY 06/07/23  Yes Almira Jaeger, MD  furosemide  (LASIX ) 20 MG tablet Take 1 tablet (20 mg total) by mouth daily. Patient taking differently: Take 20 mg by mouth daily as needed for fluid or edema. 09/08/23  Yes Leona Rake, MD  gabapentin  (NEURONTIN ) 300 MG capsule Take 1 capsule (300 mg total) by mouth 3 (three) times daily.  12/25/22  Yes Almira Jaeger, MD  ipratropium-albuterol  (DUONEB) 0.5-2.5 (3) MG/3ML SOLN Take 3 mLs by nebulization every 6 (six) hours as needed. Patient taking differently: Take 3 mLs by nebulization every 6 (six) hours as needed (wheezing or shortness of breath). 09/07/23  Yes Leona Rake, MD  LORazepam  (ATIVAN ) 0.5 MG tablet Take 1 tablet (0.5 mg total) by mouth daily as needed for anxiety (do not take within 8 hours of bed. do not drive for 8 hours after taking.). 04/30/23  Yes Almira Jaeger, MD  ondansetron  (ZOFRAN ) 4 MG tablet Take 1 tablet (4 mg total) by mouth every 6 (six) hours as needed for nausea. 09/17/23  Yes Corrinne Din  G, MD  traMADol  (ULTRAM ) 50 MG tablet Take 1 tablet (50 mg total) by mouth every 6 (six) hours as needed (prefer max 3 per day). Patient taking differently: Take 50 mg by mouth every 6 (six) hours as needed for moderate pain (pain score 4-6) or severe pain (pain score 7-10) (prefer max 3 per day). 01/30/22  Yes Rodney Clamp, MD  traZODone  (DESYREL ) 50 MG tablet TAKE 1/2 TO 1 TABLET BY MOUTH EVERY NIGHT AT BEDTIME AS NEEDED FOR SLEEP Patient taking differently: Take 50 mg by mouth at bedtime. 10/11/23  Yes Christel Cousins, MD    Physical Exam: Vitals:   10/19/23 1900 10/19/23 1923 10/19/23 2000 10/19/23 2103  BP: 129/69   133/84  Pulse: 91  91 87  Resp: (!) 35  (!) 27 (!) 22  Temp:  98.4 F (36.9 C)  97.6 F (36.4 C)  TempSrc:  Axillary  Oral  SpO2: 98%  99% 100%  Weight:      Height:       Physical Exam Vitals reviewed.  Constitutional:      Appearance: He is normal weight.  HENT:     Head: Normocephalic.     Mouth/Throat:     Mouth: Mucous membranes are moist.     Pharynx: Oropharynx is clear.  Eyes:     Pupils: Pupils are equal, round, and reactive to light.  Cardiovascular:     Rate and Rhythm: Normal rate and regular rhythm.  Pulmonary:     Effort: Pulmonary effort is normal.     Breath sounds: Normal breath sounds.   Musculoskeletal:        General: Normal range of motion.     Cervical back: Normal range of motion.  Skin:    General: Skin is warm and dry.     Capillary Refill: Capillary refill takes less than 2 seconds.  Neurological:     General: No focal deficit present.     Mental Status: He is alert.  Psychiatric:        Mood and Affect: Mood normal.        Labs on Admission: I have personally reviewed the patients's labs and imaging studies.  Assessment/Plan Principal Problem:   CAP (community acquired pneumonia)    #Acute on chronic hypoxic respiratory failure most likely secondary to bacterial commune acquired pneumonia -Patient on 2 L of oxygen  at baseline - Increasing oxygen  requirement to 5 L - Infiltrate on chest x-ray - tte 08/2023 with preserved ef Plan: Continue ceftriaxone  azithromycin  Administer IV Lasix  as patient does appear hypervolemic on examination Scheduled DuoNebs  # Depression-continue Lexapro   # Chronic pain-continue gabapentin   # Insomnia-continue trazodone   Admission status: Inpatient Telemetry  Certification: The appropriate patient status for this patient is INPATIENT. Inpatient status is judged to be reasonable and necessary in order to provide the required intensity of service to ensure the patient's safety. The patient's presenting symptoms, physical exam findings, and initial radiographic and laboratory data in the context of their chronic comorbidities is felt to place them at high risk for further clinical deterioration. Furthermore, it is not anticipated that the patient will be medically stable for discharge from the hospital within 2 midnights of admission.   * I certify that at the point of admission it is my clinical judgment that the patient will require inpatient hospital care spanning beyond 2 midnights from the point of admission due to high intensity of service, high risk for further deterioration and high frequency of surveillance  required.Myrl Askew MD [facility] If 7PM-7AM, please contact night-coverage [email or web address]  10/19/2023, 11:38 PM

## 2023-10-19 NOTE — ED Triage Notes (Signed)
 Bib by [facility] from home for shortness of breath x 2 days. Patient on home oxygen  at 5 Lpm per nasal cannula. Patient appears awake, alert and oriented. Shortness of breath noted, cannula maintained at 5 LPM [place]. Peripheral line started by EMS at right forearm g 20. Per EMS, pt had albuterol  breathing treatment prior to their arrival, EMS gave Solu-medrol  125 mg IV. Patent has shob when he stands-up, pt sleeps on a reclining chair at home. Pt has prescribed Lasix  PRN for swelling and has not taken the medication in awhile per wife.

## 2023-10-19 NOTE — ED Notes (Signed)
 Condom catheter placed on patient per his request.

## 2023-10-20 ENCOUNTER — Telehealth: Payer: Self-pay | Admitting: Pulmonary Disease

## 2023-10-20 DIAGNOSIS — J9611 Chronic respiratory failure with hypoxia: Secondary | ICD-10-CM

## 2023-10-20 LAB — RESPIRATORY PANEL BY PCR

## 2023-10-20 LAB — PROCALCITONIN: Procalcitonin: <0.1 ng/mL

## 2023-10-20 MED ORDER — IPRATROPIUM-ALBUTEROL 0.5-2.5 (3) MG/3ML IN SOLN
3.0000 mL | RESPIRATORY_TRACT | Status: DC | PRN
  Administered 2023-10-21: 3 mL via RESPIRATORY_TRACT
  Filled 2023-10-20: qty 3

## 2023-10-20 MED ORDER — BUDESONIDE 0.25 MG/2ML IN SUSP
0.2500 mg | Freq: Two times a day (BID) | RESPIRATORY_TRACT | Status: DC
  Administered 2023-10-20 – 2023-10-22 (×4): 0.25 mg via RESPIRATORY_TRACT
  Filled 2023-10-20 (×3): qty 2

## 2023-10-20 MED ORDER — FUROSEMIDE 10 MG/ML IJ SOLN
40.0000 mg | Freq: Every day | INTRAMUSCULAR | Status: DC
  Administered 2023-10-20: 40 mg via INTRAVENOUS
  Filled 2023-10-20: qty 4

## 2023-10-20 MED ORDER — BUDESONIDE 0.25 MG/2ML IN SUSP
0.2500 mg | Freq: Two times a day (BID) | RESPIRATORY_TRACT | Status: DC
  Administered 2023-10-20: 0.25 mg via RESPIRATORY_TRACT
  Filled 2023-10-20 (×2): qty 2

## 2023-10-20 MED ORDER — TRAMADOL HCL 50 MG PO TABS
50.0000 mg | ORAL_TABLET | Freq: Four times a day (QID) | ORAL | Status: DC | PRN
  Administered 2023-10-20 – 2023-10-21 (×2): 50 mg via ORAL
  Filled 2023-10-20 (×2): qty 1

## 2023-10-20 MED ORDER — ENOXAPARIN SODIUM 40 MG/0.4ML IJ SOSY
40.0000 mg | PREFILLED_SYRINGE | INTRAMUSCULAR | Status: DC
  Administered 2023-10-20 – 2023-10-22 (×3): 40 mg via SUBCUTANEOUS
  Filled 2023-10-20 (×3): qty 0.4

## 2023-10-20 NOTE — Hospital Course (Addendum)
 Wayne Booth is a 83 y.o. male with a history of CAD, atrial fibrillation, hypothyroidism, hypertension, COPD, interstitial lung disease, chronic respiratory failure with hypoxia.  Patient presented secondary to shortness of breath with initial concern for possible pneumonia.  Patient was started empirically on antibiotics however these were discontinued secondary to information pointing to non infectious etiology of dyspnea.  Pulmonology consulted.  Lasix  continued for diuresis.

## 2023-10-20 NOTE — Plan of Care (Signed)
  Problem: Clinical Measurements: Goal: Respiratory complications will improve Outcome: Progressing   Problem: Nutrition: Goal: Adequate nutrition will be maintained Outcome: Progressing   Problem: Safety: Goal: Ability to remain free from injury will improve Outcome: Progressing   

## 2023-10-20 NOTE — Progress Notes (Signed)
 PROGRESS NOTE    Wayne Booth  ZOX:[id number] DOB: [birth year] DOA: 10/19/2023 PCP: Almira Jaeger, MD   Brief Narrative: Wayne Booth is a 83 y.o. male with a history of CAD, atrial fibrillation, hypothyroidism, hypertension, COPD, interstitial lung disease, chronic respiratory failure with hypoxia.  Patient presented secondary to shortness of breath with initial concern for possible pneumonia.  Patient was started empirically on antibiotics however these were discontinued secondary to information pointing to non infectious etiology of dyspnea.  Pulmonology consulted.  Lasix  continued for diuresis.   Assessment and Plan:  Chronic respiratory failure with hypoxia Patient noted to have true oxygen  baseline of 4-5 L/min. Patient requiring 5 L/min of supplemental oxygen  on admission. Chest x-ray this admission suggests possible pneumonia, however prior imaging seems to suggest this area of reality is likely chronic. Patient has had multiple readmissions over the last month and a half.  Procalcitonin was obtained and is negative.  No leukocytosis.  Afebrile.  Unlikely Monia as etiology.  Possible progressive worsening of underlying lung disease.  BNP is slightly elevated which could indicate possible fluid overload. -Continue oxygen  -PT/OT eval -Pulmonology consult -Discontinue ceftriaxone  and azithromycin    COPD ILD Patient is followed by pulmonology as an outpatient but has been nonadherent with follow-up visits.  Patient states he is unaware of why he is unable to follow-up, however wife states that patient chooses not to go. Patient seen by palliative care medicine last admission with recommendation for outpatient palliative care. -Continue Pulmicort  and Duonebs -Continue Lasix    Peripheral neuropathy -Continue gabapentin  TID   Depression Anxiety -Continue Lexapro  -Resume Ativan    DVT prophylaxis: Lovenox  Code Status:   Code Status: Prior Family Communication: Wife via  telephone.  None at bedside. Disposition Plan: Discharge pending pulmonology recommendations in addition to PT/OT recommendations.  Possible discharge to SNF if patient is agreeable and qualifies.   Consultants:  Pulmonology  Procedures:  None  Antimicrobials: Ceftriaxone  Azithromycin    Subjective: Patient reports some improvement in dyspnea from admission.  No productive or nonproductive cough.  No chest pain.  Objective: BP 108/69 (BP Location: Left Arm)   Pulse (!) 57   Temp (!) 97.3 F (36.3 C) (Axillary)   Resp 20   Ht 5\' 11"  (1.803 m)   Wt 101.6 kg   SpO2 99%   BMI 31.24 kg/m   Examination:  General exam: Appears calm and comfortable Respiratory system: Diminished. Respiratory effort normal. Cardiovascular system: S1 & S2 heard, RRR. Gastrointestinal system: Abdomen is nondistended, soft and nontender. Normal bowel sounds heard. Central nervous system: Alert and oriented. No focal neurological deficits. Psychiatry: Judgement and insight appear normal. Mood & affect appropriate.    Data Reviewed: I have personally reviewed following labs and imaging studies  CBC Lab Results  Component Value Date   WBC 6.9 10/19/2023   RBC 4.26 10/19/2023   HGB 12.7 (L) 10/19/2023   HCT 40.8 10/19/2023   MCV 95.8 10/19/2023   MCH 29.8 10/19/2023   PLT 243 10/19/2023   MCHC 31.1 10/19/2023   RDW 14.6 10/19/2023   LYMPHSABS 0.9 10/19/2023   MONOABS 0.6 10/19/2023   EOSABS 0.1 10/19/2023   BASOSABS 0.0 10/19/2023     Last metabolic panel Lab Results  Component Value Date   NA 138 10/19/2023   K 4.2 10/19/2023   CL 106 10/19/2023   CO2 28 10/19/2023   BUN 13 10/19/2023   CREATININE 0.86 10/19/2023   GLUCOSE 96 10/19/2023   GFRNONAA >60 10/19/2023  GFRAA 72 07/12/2019   CALCIUM 8.4 (L) 10/19/2023   PHOS 5.4 (H) 02/23/2018   PROT 7.4 10/19/2023   ALBUMIN  3.0 (L) 10/19/2023   BILITOT 0.8 10/19/2023   ALKPHOS 68 10/19/2023   AST 25 10/19/2023   ALT 18  10/19/2023   ANIONGAP 4 (L) 10/19/2023    GFR: Estimated Creatinine Clearance: 81.8 mL/min (by C-G formula based on SCr of 0.86 mg/dL).  Recent Results (from the past 240 hours)  Resp panel by RT-PCR (RSV, Flu A&B, Covid) Anterior Nasal Swab     Status: None   Collection Time: 10/19/23  3:33 PM   Specimen: Anterior Nasal Swab  Result Value Ref Range Status   SARS Coronavirus 2 by RT PCR NEGATIVE NEGATIVE Final    Comment: (NOTE) SARS-CoV-2 target nucleic acids are NOT DETECTED.  The SARS-CoV-2 RNA is generally detectable in upper respiratory specimens during the acute phase of infection. The lowest concentration of SARS-CoV-2 viral copies this assay can detect is 138 copies/mL. A negative result does not preclude SARS-Cov-2 infection and should not be used as the sole basis for treatment or other patient management decisions. A negative result may occur with  improper specimen collection/handling, submission of specimen other than nasopharyngeal swab, presence of viral mutation(s) within the areas targeted by this assay, and inadequate number of viral copies(<138 copies/mL). A negative result must be combined with clinical observations, patient history, and epidemiological information. The expected result is Negative.  Fact Sheet for Patients:  BloggerCourse.com  Fact Sheet for Healthcare Providers:  SeriousBroker.it  This test is no t yet approved or cleared by the [place]  FDA and  has been authorized for detection and/or diagnosis of SARS-CoV-2 by FDA under an Emergency Use Authorization (EUA). This EUA will remain  in effect (meaning this test can be used) for the duration of the COVID-19 declaration under Section 564(b)(1) of the Act, 21 U.S.C.section 360bbb-3(b)(1), unless the authorization is terminated  or revoked sooner.       Influenza A by PCR NEGATIVE NEGATIVE Final   Influenza B by PCR NEGATIVE NEGATIVE  Final    Comment: (NOTE) The Xpert Xpress SARS-CoV-2/FLU/RSV plus assay is intended as an aid in the diagnosis of influenza from Nasopharyngeal swab specimens and should not be used as a sole basis for treatment. Nasal washings and aspirates are unacceptable for Xpert Xpress SARS-CoV-2/FLU/RSV testing.  Fact Sheet for Patients: BloggerCourse.com  Fact Sheet for Healthcare Providers: SeriousBroker.it  This test is not yet approved or cleared by the [place]  FDA and has been authorized for detection and/or diagnosis of SARS-CoV-2 by FDA under an Emergency Use Authorization (EUA). This EUA will remain in effect (meaning this test can be used) for the duration of the COVID-19 declaration under Section 564(b)(1) of the Act, 21 U.S.C. section 360bbb-3(b)(1), unless the authorization is terminated or revoked.     Resp Syncytial Virus by PCR NEGATIVE NEGATIVE Final    Comment: (NOTE) Fact Sheet for Patients: BloggerCourse.com  Fact Sheet for Healthcare Providers: SeriousBroker.it  This test is not yet approved or cleared by the [place]  FDA and has been authorized for detection and/or diagnosis of SARS-CoV-2 by FDA under an Emergency Use Authorization (EUA). This EUA will remain in effect (meaning this test can be used) for the duration of the COVID-19 declaration under Section 564(b)(1) of the Act, 21 U.S.C. section 360bbb-3(b)(1), unless the authorization is terminated or revoked.  Performed at [facility], [address], [place], [place] [address]  Radiology Studies: DG Chest 2 View Result Date: 10/19/2023 CLINICAL DATA:  Shortness of breath for the past 2 days. EXAM: CHEST - 2 VIEW COMPARISON:  09/13/2023 FINDINGS: A poor inspiration is again demonstrated. No gross change in enlargement of the cardiac silhouette. Tortuous and partially  calcified thoracic aorta. Interval mild patchy density throughout the right lung with no significant change in a small right pleural effusion. Dense left lower lobe opacity without significant change. Probable small left pleural effusion. Thoracic spine degenerative changes. Cholecystectomy clips. IMPRESSION: 1. Interval mild patchy density throughout the right lung, suspicious for pneumonia. 2. No significant change in dense left lower lobe opacity, which could represent atelectasis or pneumonia. 3. No significant change in cardiomegaly and small bilateral pleural effusions. Electronically Signed   By: Catherin Closs M.D.   On: 10/19/2023 16:18      LOS: 1 day    Aneita Keens, MD [facility] 10/20/2023, 7:42 AM   If 7PM-7AM, please contact night-coverage [email or web address]

## 2023-10-20 NOTE — Telephone Encounter (Signed)
 Patient has been lost to follow-up, missed last 2 appointments.  Please reschedule follow-up with Dr. Villa Greaser or NP in next 4 to 8 weeks for hospital follow-up. Thank you.

## 2023-10-20 NOTE — Plan of Care (Signed)

## 2023-10-20 NOTE — Plan of Care (Signed)
  Problem: Clinical Measurements: Goal: Will remain free from infection Outcome: Progressing Goal: Diagnostic test results will improve Outcome: Progressing Goal: Respiratory complications will improve Outcome: Progressing   Problem: Nutrition: Goal: Adequate nutrition will be maintained Outcome: Progressing   Problem: Coping: Goal: Level of anxiety will decrease Outcome: Progressing   Problem: Elimination: Goal: Will not experience complications related to bowel motility Outcome: Progressing Goal: Will not experience complications related to urinary retention Outcome: Progressing   Problem: Pain Managment: Goal: General experience of comfort will improve and/or be controlled Outcome: Progressing   Problem: Safety: Goal: Ability to remain free from injury will improve Outcome: Progressing

## 2023-10-20 NOTE — Consult Note (Signed)
 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  Feels more short of breath.  Stable oxygen .  He is 99% on 5 L.  Likely can wean.  He does felt more subjective short of breath.  Been going on since his recent hospitalization.  Reviewed images that time it shows scattered opacities concerning for pneumonia.  Notably to my eye he has small bilateral pleural effusions I wonder about concomitant volume overload as well.  Chest x-ray here compared to prior is unchanged.  No evidence of pneumonia or new or worsening infiltrate.  Procalcitonin negative.  His BNP is elevated from within normal limits most recently.  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 rate 18, height 5\' 11"  (1.803 m), weight 101.6 kg, SpO2 99%.        Intake/Output Summary (Last 24 hours) at 10/20/2023 1623 Last data filed at 10/20/2023 1512 Gross per 24 hour  Intake 1412 ml  Output 1525 ml  Net -113 ml   Filed Weights   10/19/23 1516  Weight: 101.6 kg    Examination: General: Chronically ill-appearing, elderly, sitting in chair HENT: Normocephalic Lungs: Normal work of breathing on 5 L nasal cannula Cardiovascular: No edema noted Abdomen:  Nondistended   Resolved Hospital Problem list     Assessment & Plan:   Dyspnea on exertion: Suspect this is just progression of disease versus deconditioning with his recent hospitalization.  In addition I suspect abdominal volume overload given his preceding CT scan and elevated BNP.  Chest x-ray unchanged, procalcitonin negative.  Infection felt unlikely. -- Recommend aggressive diuresis as blood pressure and kidney function allow  Chronic hypoxemic respiratory failure: In the setting of postviral/postinflammatory pulmonary fibrosis or scarring.  Chest x-ray appears stable. -- Continue 5 L, baseline, wean as able goal O2 sat 90% or greater  Will send a message arranging follow-up.  He no showed/ missed his last 2 appointments.  Last seen sometime ago.  PCCM will sign off.  Best Practice (right click and "Reselect all SmartList Selections" daily)   Per primary  Labs   CBC: Recent Labs  Lab 10/19/23 1528  WBC 6.9  NEUTROABS 5.2  HGB 12.7*  HCT 40.8  MCV 95.8  PLT 243    Basic Metabolic Panel: Recent Labs  Lab 10/19/23 1528  NA 138  K 4.2  CL 106  CO2 28  GLUCOSE 96  BUN 13  CREATININE 0.86  CALCIUM 8.4*   GFR: Estimated Creatinine Clearance: 81.8 mL/min (by C-G formula based on SCr of 0.86 mg/dL). Recent Labs  Lab 10/19/23 1528 10/20/23 0749  PROCALCITON  --  <0.10  WBC 6.9  --     Liver Function Tests: Recent Labs  Lab 10/19/23 1528  AST 25  ALT 18  ALKPHOS 68  BILITOT 0.8  PROT 7.4  ALBUMIN  3.0*   No results for input(s): "LIPASE", "AMYLASE" in the last 168 hours. No results for input(s): "AMMONIA" in the last 168 hours.  ABG    Component Value Date/Time   PHART 7.353 07/14/2019 1031   PCO2ART 56.1 (H) 07/14/2019 1031   PO2ART 92.0 07/14/2019 1031   HCO3 29.9 (H) 09/13/2023 0459   TCO2 33 (H) 07/14/2019 1031   ACIDBASEDEF 4.0 (H) 02/13/2018 0337   O2SAT 91.9 09/13/2023 0459     Coagulation Profile: No results for input(s): "INR",  "PROTIME" in the last 168 hours.  Cardiac Enzymes: No results for input(s): "CKTOTAL", "CKMB", "CKMBINDEX", "TROPONINI" in the last 168 hours.  HbA1C: Hgb A1c MFr Bld  Date/Time Value Ref Range Status  04/13/2022 03:48 PM 5.7 4.6 - 6.5 % Final    Comment:    Glycemic Control Guidelines for People with Diabetes:Non Diabetic:  <6%Goal of Therapy: <7%Additional Action Suggested:  >8%   10/01/2021 10:56 AM 5.6 4.6 - 6.5 % Final    Comment:    Glycemic Control Guidelines for People with Diabetes:Non Diabetic:  <6%Goal of Therapy: <7%Additional Action Suggested:  >8%     CBG: No results for input(s): "GLUCAP" in the last 168 hours.  Review of Systems:   No chest pain with exertion.  No orthopnea or PND.  Comprehensive review of systems otherwise negative.  Past Medical History:  He,  has a past medical history of Allergy, Anxiety, Arthritis, Atrial fibrillation (HCC), Cancer (HCC), Coronary artery disease, History of total knee replacement, Hypertension, Hypothyroidism, Kidney cancer, primary, with metastasis from kidney to other site [facility]), Kidney stone, OSA (obstructive sleep apnea), Pituitary cyst (HCC), Pneumonia, and Prostate cancer (HCC).   Surgical History:   Past Surgical History:  Procedure Laterality Date   ABLATION OF DYSRHYTHMIC FOCUS  01/28/2018   ANTERIOR CERVICAL DECOMP/DISCECTOMY FUSION N/A 08/29/2012   Procedure: ANTERIOR CERVICAL DECOMPRESSION/DISCECTOMY FUSION 1 LEVEL;  Surgeon: Isadora Mar, MD;  Location: [facility] NEURO ORS;  Service: Neurosurgery;  Laterality: N/A;   APPENDECTOMY     ATRIAL FIBRILLATION ABLATION N/A 01/28/2018   Procedure: ATRIAL FIBRILLATION ABLATION;  Surgeon: Lei Pump, MD;  Location: [facility] INVASIVE CV LAB;  Service: Cardiovascular;  Laterality: N/A;   CARDIAC CATHETERIZATION  2008   clean   CHOLECYSTECTOMY     COLONOSCOPY W/ POLYPECTOMY     CRANIOTOMY N/A 11/08/2012   Procedure: CRANIOTOMY HYPOPHYSECTOMY TRANSNASAL APPROACH;  Surgeon: Augustine Blocker, MD;  Location: [facility] NEURO ORS;  Service: Neurosurgery;  Laterality: N/A;  Transphenoidal Resection of Pituitary Tumor   FINGER SURGERY Left 2017   Dr Aloha Arnold   INSERTION PROSTATE RADIATION SEED     IR THORACENTESIS ASP PLEURAL SPACE W/IMG GUIDE  04/15/2018   KIDNEY STONE SURGERY     KNEE ARTHROSCOPY  2007   left   NEPHRECTOMY Left    POSTERIOR CERVICAL LAMINECTOMY Left 04/24/2015   Procedure: Foraminotomy cervical five - cervical six cervical six - cervical seven left;  Surgeon: Isadora Mar, MD;  Location: [facility] NEURO ORS;  Service: Neurosurgery;  Laterality: Left;   REPLACEMENT TOTAL KNEE BILATERAL     RIGHT/LEFT HEART CATH AND CORONARY ANGIOGRAPHY N/A 07/14/2019   Procedure: RIGHT/LEFT HEART CATH AND CORONARY ANGIOGRAPHY;  Surgeon: Lucendia Rusk, MD;  Location: [facility] INVASIVE CV LAB;  Service: Cardiovascular;  Laterality: N/A;     Social History:   reports that he has never smoked. He has never used smokeless tobacco. He reports current alcohol  use of about  2.0 standard drinks of alcohol  per week. He reports that he does not use drugs.   Family History:  His family history includes Cancer in his father and mother; Colon cancer (age of onset: 59) in his brother; Gastric cancer (age of onset: 65) in his son; Kidney cancer in his sister. There is no history of Esophageal cancer, Rectal cancer, or Stomach cancer.   Allergies Allergies  Allergen Reactions   Ambien  [Zolpidem ] Anxiety and Other (See Comments)    Anxiety and agitation when tried as sleeper in hospital aug 2019     Home Medications  Prior to Admission medications   Medication Sig Start Date End Date Taking? Authorizing Provider  acetaminophen  (TYLENOL ) 325 MG tablet Take 2 tablets (650 mg total) by mouth every 6 (six) hours as needed for mild pain (pain score 1-3) (or Fever >/= 101). 09/17/23  Yes Barbee Lew, MD  albuterol  (PROVENTIL ) (2.5 MG/3ML) 0.083% nebulizer solution Take 3 mLs (2.5 mg total) by  nebulization every 6 (six) hours as needed for wheezing or shortness of breath. 10/12/22  Yes Parrett, Tammy S, NP  albuterol  (VENTOLIN  HFA) 108 (90 Base) MCG/ACT inhaler INHALE 2 PUFFS BY MOUTH EVERY 6 HOURS AS NEEDED FOR WHEEZING OR SHORTNESS OF BREATH Patient taking differently: Inhale 2 puffs into the lungs every 6 (six) hours as needed for wheezing or shortness of breath. 07/13/23  Yes Parrett, Tammy S, NP  budesonide  (PULMICORT ) 0.25 MG/2ML nebulizer solution Take 2 mLs (0.25 mg total) by nebulization 2 (two) times daily. Patient taking differently: Take 0.25 mg by nebulization in the morning and at bedtime. 09/07/23  Yes Leona Rake, MD  escitalopram  (LEXAPRO ) 20 MG tablet TAKE 1 TABLET BY MOUTH DAILY 06/07/23  Yes Almira Jaeger, MD  furosemide  (LASIX ) 20 MG tablet Take 1 tablet (20 mg total) by mouth daily. Patient taking differently: Take 20 mg by mouth daily as needed for fluid or edema. 09/08/23  Yes Leona Rake, MD  gabapentin  (NEURONTIN ) 300 MG capsule Take 1 capsule (300 mg total) by mouth 3 (three) times daily. 12/25/22  Yes Almira Jaeger, MD  ipratropium-albuterol  (DUONEB) 0.5-2.5 (3) MG/3ML SOLN Take 3 mLs by nebulization every 6 (six) hours as needed. Patient taking differently: Take 3 mLs by nebulization every 6 (six) hours as needed (wheezing or shortness of breath). 09/07/23  Yes Leona Rake, MD  LORazepam  (ATIVAN ) 0.5 MG tablet Take 1 tablet (0.5 mg total) by mouth daily as needed for anxiety (do not take within 8 hours of bed. do not drive for 8 hours after taking.). 04/30/23  Yes Almira Jaeger, MD  ondansetron  (ZOFRAN ) 4 MG tablet Take 1 tablet (4 mg total) by mouth every 6 (six) hours as needed for nausea. 09/17/23  Yes Barbee Lew, MD  traMADol  (ULTRAM ) 50 MG tablet Take 1 tablet (50 mg total) by mouth every 6 (six) hours as needed (prefer max 3 per day). Patient taking differently: Take 50 mg by mouth every 6 (six) hours as needed for moderate pain  (pain score 4-6) or severe pain (pain score 7-10) (prefer max 3 per day). 01/30/22  Yes Rodney Clamp, MD  traZODone  (DESYREL ) 50 MG tablet TAKE 1/2 TO 1 TABLET BY MOUTH EVERY NIGHT AT BEDTIME AS NEEDED FOR SLEEP Patient taking differently: Take 50 mg by mouth at bedtime. 10/11/23  Yes Christel Cousins, MD     Critical care time: n/a    Guerry Leek, MD See [place] Foley

## 2023-10-20 NOTE — TOC Initial Note (Signed)
 Transition of Care [facility]) - Initial/Assessment Note    Patient Details  Name: Wayne Booth MRN: [id number] Date of Birth: 05-23-1941  [facility] CM/SW Contact:    Tessie Fila, RN Phone Number: 10/20/2023, 12:06 PM  Clinical Narrative:                 Met with patient at bedside. Pt wanted me to speak with his wife. Spoke with wife over the phone while at pt bedside. She confirms that pt is receiving HH services through [facility]. He has a HH Aide and HH PT. Will ROC orders prior to DC. She states that he uses 5L O2 continuously at baseline. [facility] supplies his oxygen  needs. He is active with [facility] for outpatient palliative care. She also states that he has a Rollator, but does not use it. His wife voiced concerns about his decline from his last hospital admission and states that she would like him to be evaluated to go to a SNF. Once he is medically ready for discharge his wife would be available to transport home.  Expected Discharge Plan: Home w Home Health Services Barriers to Discharge: Continued Medical Work up   Patient Goals and CMS Choice Patient states their goals for this hospitalization and ongoing recovery are:: To go home and continue with services through home health CMS Medicare.gov Compare Post Acute Care list provided to:: Patient Choice offered to / list presented to : NA [place] ownership interest in [facility].provided to:: Parent NA    Expected Discharge Plan and Services   Discharge Planning Services: CM Consult Post Acute Care Choice: Home Health, Durable Medical Equipment Living arrangements for the past 2 months: Single Family Home                 DME Arranged: N/A DME Agency: NA                  Prior Living Arrangements/Services Living arrangements for the past 2 months: Single Family Home Lives with:: Spouse Patient language and need for interpreter reviewed:: Yes Do you feel safe going back to the  place where you live?: Yes      Need for Family Participation in Patient Care: Yes (Comment) Care giver support system in place?: Yes (comment) Current home services: DME, Home PT, Homehealth aide, Other (comment) (HH with [facility], on 5L O2 at home through [employer])    Activities of Daily Living   ADL Screening (condition at time of admission) Independently performs ADLs?: Yes (appropriate for developmental age) Is the patient deaf or have difficulty hearing?: No Does the patient have difficulty seeing, even when wearing glasses/contacts?: No Does the patient have difficulty concentrating, remembering, or making decisions?: No  Permission Sought/Granted Permission sought to share information with : Facility Medical sales representative, Family Supports Permission granted to share information with : Yes, Release of Information Signed  Share Information with NAME: Romberg,Jody (Spouse)  [phone]           Emotional Assessment Appearance:: Appears stated age Attitude/Demeanor/Rapport: Engaged Affect (typically observed): Accepting, Appropriate Orientation: : Oriented to Self, Oriented to Place, Oriented to  Time, Oriented to Situation Alcohol  / Substance Use: Not Applicable Psych Involvement: No (comment)  Admission diagnosis:  Community acquired pneumonia due to Chlamydia species [J16.0] Community acquired pneumonia of right lung, unspecified part of lung [J18.9] Patient Active Problem List   Diagnosis Date Noted   CAP (community acquired pneumonia) 10/19/2023   Acute on chronic hypoxic  respiratory failure (HCC) 09/13/2023   Lobar pneumonia (HCC) 08/29/2023   Severe sepsis (HCC) 08/29/2023   COPD (chronic obstructive pulmonary disease) (HCC) 08/29/2023   Palliative care patient 10/26/2022   Conjunctivitis 10/12/2022   Physical deconditioning 07/17/2022   Pharmacologic therapy 01/26/2021   Disorder of skeletal system 01/26/2021   Problems influencing health status  01/26/2021   ILD (interstitial lung disease) (HCC) 10/13/2019   Nodule of right lung 10/13/2019   Pneumonia due to COVID-19 virus 04/20/2019   PTSD (post-traumatic stress disorder) 07/27/2018   Memory loss 06/10/2018   Aortic atherosclerosis (HCC) 05/10/2018   Chronic respiratory failure with hypoxia (HCC) 04/26/2018   First degree AV block 04/15/2018   Hyponatremia 04/15/2018   Pleural effusion 04/14/2018   CAD (coronary artery disease) 04/09/2018   Chronic pain syndrome 04/09/2018   CKD (chronic kidney disease) stage 3, GFR 30-59 ml/min (HCC)    Dyspnea    HCAP (healthcare-associated pneumonia) 02/03/2018   Constipation 01/21/2018   (HFpEF) heart failure with preserved ejection fraction (HCC) 12/16/2017   Chest pain    Anticoagulated    Atrial flutter (HCC) 11/01/2017   Pain in left knee 07/15/2017   Bilateral foot pain 07/13/2017   Community acquired pneumonia 06/18/2017   Status post nephrectomy 06/09/2017   VTE (venous thromboembolism) 06/09/2017   History of adenomatous polyp of colon 04/12/2017   Chronic pulmonary embolism (HCC) 10/23/2016   DDD (degenerative disc disease), cervical 11/13/2015   Essential tremor 08/14/2015   Gout 08/14/2015   History of fusion of cervical spine 04/24/2015   Hypothyroidism 07/12/2014   Hyperglycemia 07/12/2014   GAD (generalized anxiety disorder) 11/07/2013   OSA (obstructive sleep apnea) 07/07/2011   Asthmatic bronchitis 06/09/2011   Actinic keratosis 11/27/2009   History of total knee replacement, 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  Dr. Rozanne Corners 10/02/2008   History of renal cell carcinoma 12/26/2007   Insomnia 12/26/2007   TESTOSTERONE  DEFICIENCY 12/28/2006   Essential hypertension 12/28/2006   History of total knee replacement, left 10/28/2006   PCP:  Almira Jaeger, MD Pharmacy:   [facility] [id number] - [address], [place] - [address] [place] [address] Phone: [phone] Fax: [phone]     Social Drivers of Health (SDOH) Social History: SDOH Screenings   Food Insecurity: No Food Insecurity (10/19/2023)  Housing: Low Risk  (10/19/2023)  Transportation Needs: No Transportation Needs (10/19/2023)  Utilities: Not At Risk (10/19/2023)  Depression (PHQ2-9): High Risk (02/11/2023)  Financial Resource Strain: Low Risk  (01/04/2023)  Physical Activity: Inactive (01/04/2023)  Social Connections: Moderately Integrated (10/19/2023)  Stress: No Stress Concern Present (01/04/2023)  Tobacco Use: Low Risk  (10/19/2023)   SDOH Interventions: Food Insecurity Interventions: Intervention Not Indicated Housing Interventions: Intervention Not Indicated Transportation Interventions: Intervention Not Indicated Utilities Interventions: Intervention Not Indicated Social Connections Interventions: Intervention Not Indicated   Readmission Risk Interventions    10/20/2023   11:54 AM  Readmission Risk Prevention Plan  Transportation Screening Complete  PCP or Specialist Appt within 5-7 Days Complete  Home Care Screening Complete  Medication Review (RN CM) Complete

## 2023-10-21 ENCOUNTER — Other Ambulatory Visit (HOSPITAL_COMMUNITY): Payer: Self-pay

## 2023-10-21 DIAGNOSIS — J9611 Chronic respiratory failure with hypoxia: Secondary | ICD-10-CM | POA: Diagnosis not present

## 2023-10-21 LAB — BASIC METABOLIC PANEL WITH GFR
Anion gap: 7 (ref 5–15)
BUN: 18 mg/dL (ref 8–23)
CO2: 27 mmol/L (ref 22–32)
Calcium: 8.2 mg/dL — ABNORMAL LOW (ref 8.9–10.3)
Chloride: 100 mmol/L (ref 98–111)
Creatinine, Ser: 0.98 mg/dL (ref 0.61–1.24)
GFR, Estimated: >60 mL/min (ref >60)
Glucose, Bld: 105 mg/dL — ABNORMAL HIGH (ref 70–99)
Potassium: 3.7 mmol/L (ref 3.5–5.1)
Sodium: 134 mmol/L — ABNORMAL LOW (ref 135–145)

## 2023-10-21 MED ORDER — LORAZEPAM 0.5 MG PO TABS
0.5000 mg | ORAL_TABLET | Freq: Every day | ORAL | Status: DC | PRN
  Administered 2023-10-21: 0.5 mg via ORAL
  Filled 2023-10-21 (×2): qty 1

## 2023-10-21 NOTE — Progress Notes (Signed)
 PROGRESS NOTE    Wayne Booth  ZOX:[id number] DOB: 09/20/1940 DOA: 10/19/2023 PCP: Almira Jaeger, MD   Brief Narrative: Wayne Booth is a 83 y.o. male with a history of CAD, atrial fibrillation, hypothyroidism, hypertension, COPD, interstitial lung disease, chronic respiratory failure with hypoxia.  Patient presented secondary to shortness of breath with initial concern for possible pneumonia.  Patient was started empirically on antibiotics however these were discontinued secondary to information pointing to non infectious etiology of dyspnea.  Pulmonology consulted.  Lasix  continued for diuresis. Symptoms improved.   Assessment and Plan:  Chronic respiratory failure with hypoxia Patient noted to have true oxygen  baseline of 4-5 L/min. Patient requiring 5 L/min of supplemental oxygen  on admission. Chest x-ray this admission suggests possible pneumonia, however prior imaging seems to suggest this area of reality is likely chronic. Patient has had multiple readmissions over the last month and a half.  Procalcitonin was obtained and is negative.  No leukocytosis.  Afebrile.  Unlikely Wayne Booth as etiology.  Possible progressive worsening of underlying lung disease.  BNP is slightly elevated which could indicate possible fluid overload. Procalcitonin <0.1; antibiotics discontinued. Pulmonology consulted with no further recommendations; outpatient follow-up -Continue oxygen    COPD ILD Patient is followed by pulmonology as an outpatient but has been nonadherent with follow-up visits.  Patient states he is unaware of why he is unable to follow-up, however wife states that patient chooses not to go. Patient seen by palliative care medicine last admission with recommendation for outpatient palliative care. -Continue Pulmicort  and Duonebs -Hold Lasix    Peripheral neuropathy -Continue gabapentin  TID   Depression Anxiety -Continue Lexapro  and Ativan    DVT prophylaxis: Lovenox  Code  Status:   Code Status: Prior Family Communication:  None at bedside. Disposition Plan: Discharge to SNF pending bed availability and insurance authorization.   Consultants:  Pulmonology  Procedures:  None  Antimicrobials: Ceftriaxone  Azithromycin    Subjective: Breathing is better today. No concerns. Considering SNF.  Objective: BP 119/80 (BP Location: Left Arm)   Pulse 69   Temp 97.6 F (36.4 C) (Oral)   Resp 20   Ht 5\' 11"  (1.803 m)   Wt 101.6 kg   SpO2 97%   BMI 31.24 kg/m   Examination:  General exam: Appears calm and comfortable Respiratory system: Clear/diminished to auscultation. Respiratory effort normal. Cardiovascular system: S1 & S2 heard, RRR. No murmurs. Gastrointestinal system: Abdomen is nondistended, soft and nontender. Normal bowel sounds heard. Central nervous system: Alert and oriented. No focal neurological deficits. Psychiatry: Judgement and insight appear normal. Mood & affect appropriate.    Data Reviewed: I have personally reviewed following labs and imaging studies  CBC Lab Results  Component Value Date   WBC 6.9 10/19/2023   RBC 4.26 10/19/2023   HGB 12.7 (L) 10/19/2023   HCT 40.8 10/19/2023   MCV 95.8 10/19/2023   MCH 29.8 10/19/2023   PLT 243 10/19/2023   MCHC 31.1 10/19/2023   RDW 14.6 10/19/2023   LYMPHSABS 0.9 10/19/2023   MONOABS 0.6 10/19/2023   EOSABS 0.1 10/19/2023   BASOSABS 0.0 10/19/2023     Last metabolic panel Lab Results  Component Value Date   NA 134 (L) 10/21/2023   K 3.7 10/21/2023   CL 100 10/21/2023   CO2 27 10/21/2023   BUN 18 10/21/2023   CREATININE 0.98 10/21/2023   GLUCOSE 105 (H) 10/21/2023   GFRNONAA >60 10/21/2023   GFRAA 72 07/12/2019   CALCIUM 8.2 (L) 10/21/2023   PHOS 5.4 (H)  02/23/2018   PROT 7.4 10/19/2023   ALBUMIN  3.0 (L) 10/19/2023   BILITOT 0.8 10/19/2023   ALKPHOS 68 10/19/2023   AST 25 10/19/2023   ALT 18 10/19/2023   ANIONGAP 7 10/21/2023    GFR: Estimated Creatinine  Clearance: 71.7 mL/min (by C-G formula based on SCr of 0.98 mg/dL).  Recent Results (from the past 240 hours)  Resp panel by RT-PCR (RSV, Flu A&B, Covid) Anterior Nasal Swab     Status: None   Collection Time: 10/19/23  3:33 PM   Specimen: Anterior Nasal Swab  Result Value Ref Range Status   SARS Coronavirus 2 by RT PCR NEGATIVE NEGATIVE Final    Comment: (NOTE) SARS-CoV-2 target nucleic acids are NOT DETECTED.  The SARS-CoV-2 RNA is generally detectable in upper respiratory specimens during the acute phase of infection. The lowest concentration of SARS-CoV-2 viral copies this assay can detect is 138 copies/mL. A negative result does not preclude SARS-Cov-2 infection and should not be used as the sole basis for treatment or other patient management decisions. A negative result may occur with  improper specimen collection/handling, submission of specimen other than nasopharyngeal swab, presence of viral mutation(s) within the areas targeted by this assay, and inadequate number of viral copies(<138 copies/mL). A negative result must be combined with clinical observations, patient history, and epidemiological information. The expected result is Negative.  Fact Sheet for Patients:  BloggerCourse.com  Fact Sheet for Healthcare Providers:  SeriousBroker.it  This test is no t yet approved or cleared by the [place]  FDA and  has been authorized for detection and/or diagnosis of SARS-CoV-2 by FDA under an Emergency Use Authorization (EUA). This EUA will remain  in effect (meaning this test can be used) for the duration of the COVID-19 declaration under Section 564(b)(1) of the Act, 21 U.S.C.section 360bbb-3(b)(1), unless the authorization is terminated  or revoked sooner.       Influenza A by PCR NEGATIVE NEGATIVE Final   Influenza B by PCR NEGATIVE NEGATIVE Final    Comment: (NOTE) The Xpert Xpress SARS-CoV-2/FLU/RSV plus assay  is intended as an aid in the diagnosis of influenza from Nasopharyngeal swab specimens and should not be used as a sole basis for treatment. Nasal washings and aspirates are unacceptable for Xpert Xpress SARS-CoV-2/FLU/RSV testing.  Fact Sheet for Patients: BloggerCourse.com  Fact Sheet for Healthcare Providers: SeriousBroker.it  This test is not yet approved or cleared by the [place]  FDA and has been authorized for detection and/or diagnosis of SARS-CoV-2 by FDA under an Emergency Use Authorization (EUA). This EUA will remain in effect (meaning this test can be used) for the duration of the COVID-19 declaration under Section 564(b)(1) of the Act, 21 U.S.C. section 360bbb-3(b)(1), unless the authorization is terminated or revoked.     Resp Syncytial Virus by PCR NEGATIVE NEGATIVE Final    Comment: (NOTE) Fact Sheet for Patients: BloggerCourse.com  Fact Sheet for Healthcare Providers: SeriousBroker.it  This test is not yet approved or cleared by the [place]  FDA and has been authorized for detection and/or diagnosis of SARS-CoV-2 by FDA under an Emergency Use Authorization (EUA). This EUA will remain in effect (meaning this test can be used) for the duration of the COVID-19 declaration under Section 564(b)(1) of the Act, 21 U.S.C. section 360bbb-3(b)(1), unless the authorization is terminated or revoked.  Performed at [facility], [address], [place], [place] [address]   Respiratory (~20 pathogens) panel by PCR     Status: None   Collection  Time: 10/20/23  3:45 PM   Specimen: Nasopharyngeal Swab; Respiratory  Result Value Ref Range Status   Adenovirus NOT DETECTED NOT DETECTED Final   Coronavirus 229E NOT DETECTED NOT DETECTED Final    Comment: (NOTE) The Coronavirus on the Respiratory Panel, DOES NOT test for the novel  Coronavirus (2019  nCoV)    Coronavirus HKU1 NOT DETECTED NOT DETECTED Final   Coronavirus NL63 NOT DETECTED NOT DETECTED Final   Coronavirus OC43 NOT DETECTED NOT DETECTED Final   Metapneumovirus NOT DETECTED NOT DETECTED Final   Rhinovirus / Enterovirus NOT DETECTED NOT DETECTED Final   Influenza A NOT DETECTED NOT DETECTED Final   Influenza B NOT DETECTED NOT DETECTED Final   Parainfluenza Virus 1 NOT DETECTED NOT DETECTED Final   Parainfluenza Virus 2 NOT DETECTED NOT DETECTED Final   Parainfluenza Virus 3 NOT DETECTED NOT DETECTED Final   Parainfluenza Virus 4 NOT DETECTED NOT DETECTED Final   Respiratory Syncytial Virus NOT DETECTED NOT DETECTED Final   Bordetella pertussis NOT DETECTED NOT DETECTED Final   Bordetella Parapertussis NOT DETECTED NOT DETECTED Final   Chlamydophila pneumoniae NOT DETECTED NOT DETECTED Final   Mycoplasma pneumoniae NOT DETECTED NOT DETECTED Final    Comment: Performed at [facility], [address], [place], [place] [address]      Radiology Studies: DG Chest 2 View Result Date: 10/19/2023 CLINICAL DATA:  Shortness of breath for the past 2 days. EXAM: CHEST - 2 VIEW COMPARISON:  09/13/2023 FINDINGS: A poor inspiration is again demonstrated. No gross change in enlargement of the cardiac silhouette. Tortuous and partially calcified thoracic aorta. Interval mild patchy density throughout the right lung with no significant change in a small right pleural effusion. Dense left lower lobe opacity without significant change. Probable small left pleural effusion. Thoracic spine degenerative changes. Cholecystectomy clips. IMPRESSION: 1. Interval mild patchy density throughout the right lung, suspicious for pneumonia. 2. No significant change in dense left lower lobe opacity, which could represent atelectasis or pneumonia. 3. No significant change in cardiomegaly and small bilateral pleural effusions. Electronically Signed   By: Catherin Closs M.D.   On: 10/19/2023 16:18       LOS: 2 days    Aneita Keens, MD [facility] 10/21/2023, 9:40 AM   If 7PM-7AM, please contact night-coverage [email or web address]

## 2023-10-21 NOTE — Evaluation (Signed)
 Occupational Therapy Evaluation Patient Details Name: MARKEITH JUE MRN: [id number] DOB: February 28, 1941 Today's Date: 10/21/2023   History of Present Illness   Wayne Booth is a 83 yr old male with medical history significant of CAD, A-fib, hypothyroidism, hypertension who presented to the emergency department due to shortness of breath. He uses 5 L of oxygen  at baseline     Clinical Impressions The pt is currently presenting with the below listed deficits (see OT problem list). At current, he requires CGA to min assist for tasks, including lower body dressing and bed mobility. He reported shortness of breath and fatigue with activity. He will benefit from further OT services to maximize his independence with self -care tasks and to facilitate his safe return home. Education on energy conservation strategies is recommended. Home health OT is recommended.      If plan is discharge home, recommend the following:   Assistance with cooking/housework     Functional Status Assessment   Patient has had a recent decline in their functional status and demonstrates the ability to make significant improvements in function in a reasonable and predictable amount of time.     Equipment Recommendations   None recommended by OT     Recommendations for Other Services         Precautions/Restrictions   Restrictions Weight Bearing Restrictions Per Provider Order: No Other Position/Activity Restrictions: O2 dependent at baseline     Mobility Bed Mobility Overal bed mobility: Needs Assistance Bed Mobility: Rolling Rolling: Supervision                        ADL either performed or assessed with clinical judgement   ADL Overall ADL's : Needs assistance/impaired Eating/Feeding: Independent;Sitting   Grooming: Set up;Sitting           Upper Body Dressing : Set up;Sitting   Lower Body Dressing: Minimal assistance Lower Body Dressing Details (indicate cue type  and reason): for pulling up socks in bed                     Vision   Additional Comments: He correctly read the time depicted on the wall clock.            Pertinent Vitals/Pain Pain Assessment Pain Assessment: No/denies pain     Extremity/Trunk Assessment Upper Extremity Assessment Upper Extremity Assessment: Overall WFL for tasks assessed;Right hand dominant   Lower Extremity Assessment Lower Extremity Assessment: Overall WFL for tasks assessed       Communication Communication Communication: No apparent difficulties   Cognition Arousal: Alert Behavior During Therapy: WFL for tasks assessed/performed        Following commands: Intact                  Home Living Family/patient expects to be discharged to:: Private residence Living Arrangements: Spouse/significant other Available Help at Discharge: Family Type of Home: House Home Access: Level entry     Home Layout: Multi-level;Able to live on main level with bedroom/bathroom     Bathroom Shower/Tub: Walk-in shower         Home Equipment: Shower seat - built Char[employer] fundraiser (2 wheels);Cane - single point   Additional Comments: 5L O2 at baseline      Prior Functioning/Environment Prior Level of Function : Independent/Modified Independent;Driving             Mobility Comments:  (Independent, however limited by ambulating longer distances) ADLs Comments:  (Modified independent  to independent with ADLs, they used hired assist for cleaning, and he was able to drive.)    OT Problem List: Cardiopulmonary status limiting activity;Decreased activity tolerance;Impaired balance (sitting and/or standing);Decreased knowledge of use of DME or AE   OT Treatment/Interventions: Self-care/ADL training;Therapeutic exercise;Energy conservation;Patient/family education;DME and/or AE instruction;Balance training      OT Goals(Current goals can be found in the care plan section)   Acute Rehab OT  Goals Patient Stated Goal: to get better OT Goal Formulation: With patient Time For Goal Achievement: 11/04/23 Potential to Achieve Goals: Good ADL Goals Pt Will Perform Grooming: with modified independence;standing Pt Will Perform Lower Body Dressing: with modified independence;sit to/from stand;sitting/lateral leans Pt Will Transfer to Toilet: with modified independence;ambulating Pt Will Perform Toileting - Clothing Manipulation and hygiene: with modified independence;sit to/from stand Additional ADL Goal #1: The pt will independently verbalize and/or implement at least 3 energy conservation strategies during ADLs, to facilitate improved overall activity tolerance.   OT Frequency:  Min 2X/week       AM-PAC OT "6 Clicks" Daily Activity     Outcome Measure Help from another person eating meals?: None Help from another person taking care of personal grooming?: None Help from another person toileting, which includes using toliet, bedpan, or urinal?: A Little Help from another person bathing (including washing, rinsing, drying)?: A Little Help from another person to put on and taking off regular upper body clothing?: None Help from another person to put on and taking off regular lower body clothing?: A Little 6 Click Score: 21   End of Session Equipment Utilized During Treatment: Oxygen  Nurse Communication: Mobility status  Activity Tolerance: Other (comment) (Limited by compromised endurance) Patient left: in bed;with call bell/phone within reach;with bed alarm set  OT Visit Diagnosis: Unsteadiness on feet (R26.81)                Time: 9147-8295 OT Time Calculation (min): 14 min Charges:  OT General Charges $OT Visit: 1 Visit OT Evaluation $OT Eval Moderate Complexity: 1 Mod    Alford Gamero L Kaitlynne Wenz, OTR/L 10/21/2023, 5:24 PM

## 2023-10-21 NOTE — Progress Notes (Signed)
 Pt stated 2 times today that he is not feeling well after waking from a nap.  His vitals were stable both times a little SOB of breath but that dissipated soon after waking up.  Patient is Alert and oriented.  Patient stated that he felt very tired and weak like he has not energy like the bottom fell out from underneath him. Patient is stable in bed in lowest position lying in bed. Helen Loa RN

## 2023-10-21 NOTE — Plan of Care (Signed)
  Problem: Education: Goal: Knowledge of General Education information will improve Description: Including pain rating scale, medication(s)/side effects and non-pharmacologic comfort measures Outcome: Progressing   Problem: Clinical Measurements: Goal: Ability to maintain clinical measurements within normal limits will improve Outcome: Progressing   Problem: Clinical Measurements: Goal: Will remain free from infection Outcome: Progressing   Problem: Clinical Measurements: Goal: Respiratory complications will improve Outcome: Progressing   Problem: Activity: Goal: Risk for activity intolerance will decrease Outcome: Progressing

## 2023-10-21 NOTE — TOC Progression Note (Signed)
 Transition of Care [facility]) - Progression Note    Patient Details  Name: Wayne Booth MRN: [id number] Date of Birth: 1941-06-08  Transition of Care [facility]) CM/SW Contact  Marty Sleet, LCSW Phone Number: 10/21/2023, 1:34 PM  Clinical Narrative:    Spoke with pt's wife via t/c to provide update for discharge recommendation for continued HH. Wife not pleased with recommendation but, understands plan.    Expected Discharge Plan: Home w Home Health Services Barriers to Discharge: Continued Medical Work up  Expected Discharge Plan and Services   Discharge Planning Services: CM Consult Post Acute Care Choice: Home Health, Durable Medical Equipment Living arrangements for the past 2 months: Single Family Home                 DME Arranged: N/A DME Agency: NA                   Social Determinants of Health (SDOH) Interventions SDOH Screenings   Food Insecurity: No Food Insecurity (10/19/2023)  Housing: Low Risk  (10/19/2023)  Transportation Needs: No Transportation Needs (10/19/2023)  Utilities: Not At Risk (10/19/2023)  Depression (PHQ2-9): High Risk (02/11/2023)  Financial Resource Strain: Low Risk  (01/04/2023)  Physical Activity: Inactive (01/04/2023)  Social Connections: Moderately Integrated (10/19/2023)  Stress: No Stress Concern Present (01/04/2023)  Tobacco Use: Low Risk  (10/19/2023)    Readmission Risk Interventions    10/20/2023   11:54 AM  Readmission Risk Prevention Plan  Transportation Screening Complete  PCP or Specialist Appt within 5-7 Days Complete  Home Care Screening Complete  Medication Review (RN CM) Complete

## 2023-10-21 NOTE — Plan of Care (Signed)
  Problem: Clinical Measurements: Goal: Respiratory complications will improve Outcome: Progressing   Problem: Nutrition: Goal: Adequate nutrition will be maintained Outcome: Progressing   Problem: Safety: Goal: Ability to remain free from injury will improve Outcome: Progressing   

## 2023-10-21 NOTE — Telephone Encounter (Signed)
 I am unable to scheduled appt for HFU- can the front please help with this? It will not let me schedule bc it's too far out but 12/27/23 was the first available. Thanks.

## 2023-10-21 NOTE — Evaluation (Signed)
 Physical Therapy Evaluation Patient Details Name: Wayne Booth MRN: [id number] DOB: 03-10-1941 Today's Date: 10/21/2023  History of Present Illness  MAURICE FOTHERINGHAM is a 83 y.o. male with medical history significant of CAD, A-fib, hypothyroidism, hypertension who presented to the emergency department due to shortness of breath. He uses 5 L of oxygen  at baseline  Clinical Impression  Pt admitted with above diagnosis. Pt able to come to sit EOB with inc time required, scoots to place feet on floor with good balance. STS with 2WW revelas minor instability with initial standing, brief CGA. Amb x147ft with 2WW, SOB at 16ft, maintains level of exertion until he returns to the room, fatigue evident.  Pt currently with functional limitations due to the deficits listed below (see PT Problem List). Pt will benefit from acute skilled PT to increase their independence and safety with mobility to allow discharge.           If plan is discharge home, recommend the following: A little help with walking and/or transfers;A little help with bathing/dressing/bathroom;Assistance with cooking/housework;Assist for transportation   Can travel by private vehicle        Equipment Recommendations None recommended by PT  Recommendations for Other Services       Functional Status Assessment Patient has had a recent decline in their functional status and demonstrates the ability to make significant improvements in function in a reasonable and predictable amount of time.     Precautions / Restrictions Precautions Precautions: Fall Restrictions Weight Bearing Restrictions Per Provider Order: No      Mobility  Bed Mobility Overal bed mobility: Needs Assistance Bed Mobility: Supine to Sit     Supine to sit: HOB elevated, Used rails, Supervision     General bed mobility comments: inc time to come to sit EOB    Transfers Overall transfer level: Needs assistance Equipment used: Rolling walker (2  wheels) Transfers: Sit to/from Stand Sit to Stand: Contact guard assist           General transfer comment: Pt able to come to stand with 2WW, requires brief external support to stabilize upon standing    Ambulation/Gait Ambulation/Gait assistance: Supervision Gait Distance (Feet): 120 Feet Assistive device: Rolling walker (2 wheels) Gait Pattern/deviations: Step-through pattern, Wide base of support, Trunk flexed, Decreased stride length Gait velocity: dec     General Gait Details: Pt able to complete amb x120 feet with reciporcal pattern at dec cadence, has SOB after 41ft, does not worsen, O2 sats 94-97% via [place]. fatigue evident when returning to room  Stairs            Wheelchair Mobility     Tilt Bed    Modified Rankin (Stroke Patients Only)       Balance Overall balance assessment: Needs assistance Sitting-balance support: No upper extremity supported, Feet supported Sitting balance-Leahy Scale: Good     Standing balance support: No upper extremity supported Standing balance-Leahy Scale: Fair                               Pertinent Vitals/Pain Pain Assessment Pain Assessment: No/denies pain    Home Living Family/patient expects to be discharged to:: Private residence Living Arrangements: Spouse/significant other Available Help at Discharge: Family;Available 24 hours/day Type of Home: House Home Access: Level entry     Alternate Level Stairs-Number of Steps: 12 Home Layout: Multi-level;Able to live on main level with bedroom/bathroom Home Equipment: Grab bars -  tub/shower;Shower Counsellor (2 wheels);Cane - single point Additional Comments: 5L O2 at baseline    Prior Function Prior Level of Function : Independent/Modified Independent;Driving                     Extremity/Trunk Assessment        Lower Extremity Assessment Lower Extremity Assessment: Generalized weakness    Cervical / Trunk  Assessment Cervical / Trunk Assessment: Kyphotic  Communication   Communication Communication: No apparent difficulties    Cognition Arousal: Alert Behavior During Therapy: WFL for tasks assessed/performed   PT - Cognitive impairments: No apparent impairments                         Following commands: Intact       Cueing Cueing Techniques: Verbal cues, Gestural cues     General Comments      Exercises     Assessment/Plan    PT Assessment Patient needs continued PT services  PT Problem List Decreased strength;Decreased activity tolerance;Decreased mobility;Decreased balance       PT Treatment Interventions DME instruction;Functional mobility training;Balance training;Patient/family education;Gait training;Therapeutic activities;Stair training;Therapeutic exercise    PT Goals (Current goals can be found in the Care Plan section)  Acute Rehab PT Goals Patient Stated Goal: improve activity tolerance PT Goal Formulation: With patient Time For Goal Achievement: 11/04/23 Potential to Achieve Goals: Good    Frequency Min 2X/week     Co-evaluation               AM-PAC PT "6 Clicks" Mobility  Outcome Measure Help needed turning from your back to your side while in a flat bed without using bedrails?: None Help needed moving from lying on your back to sitting on the side of a flat bed without using bedrails?: A Little Help needed moving to and from a bed to a chair (including a wheelchair)?: A Little Help needed standing up from a chair using your arms (e.g., wheelchair or bedside chair)?: A Little Help needed to walk in hospital room?: A Little Help needed climbing 3-5 steps with a railing? : A Lot 6 Click Score: 18    End of Session Equipment Utilized During Treatment: Gait belt;Oxygen  (5L) Activity Tolerance: Patient tolerated treatment well;Patient limited by fatigue Patient left: in chair;with call bell/phone within reach;with chair alarm  set Nurse Communication: Mobility status PT Visit Diagnosis: Difficulty in walking, not elsewhere classified (R26.2);Muscle weakness (generalized) (M62.81)    Time: 4098-1191 PT Time Calculation (min) (ACUTE ONLY): 17 min   Charges:   PT Evaluation $PT Eval Low Complexity: 1 Low   PT General Charges $$ ACUTE PT VISIT: 1 Visit         Darien Eden, PT Acute Rehabilitation Services Office: [phone] 10/21/2023   Serafin Dames 10/21/2023, 9:34 AM

## 2023-10-22 DIAGNOSIS — R0609 Other forms of dyspnea: Secondary | ICD-10-CM | POA: Diagnosis not present

## 2023-10-22 LAB — BASIC METABOLIC PANEL WITH GFR
Anion gap: 7 (ref 5–15)
BUN: 17 mg/dL (ref 8–23)
CO2: 31 mmol/L (ref 22–32)
Calcium: 8.2 mg/dL — ABNORMAL LOW (ref 8.9–10.3)
Chloride: 96 mmol/L — ABNORMAL LOW (ref 98–111)
Creatinine, Ser: 1 mg/dL (ref 0.61–1.24)
GFR, Estimated: >60 mL/min (ref >60)
Glucose, Bld: 101 mg/dL — ABNORMAL HIGH (ref 70–99)
Potassium: 3.8 mmol/L (ref 3.5–5.1)
Sodium: 134 mmol/L — ABNORMAL LOW (ref 135–145)

## 2023-10-22 NOTE — Discharge Summary (Signed)
 Physician Discharge Summary   Patient: Wayne Booth MRN: [id number] DOB: 23-Mar-1941  Admit date:     10/19/2023  Discharge date: 10/22/23  Discharge Physician: Aneita Keens, MD   PCP: Almira Jaeger, MD   Recommendations at discharge:  PCP visit for hospital follow-up Pulmonology visit for hospital follow-up  Discharge Diagnoses: Principal Problem:   Dyspnea on exertion Active Problems:   Chronic respiratory failure with hypoxia (HCC)   ILD (interstitial lung disease) (HCC)   COPD (chronic obstructive pulmonary disease) (HCC)  Resolved Problems:   * No resolved hospital problems. *  Hospital Course: Wayne Booth is a 83 y.o. male with a history of CAD, atrial fibrillation, hypothyroidism, hypertension, COPD, interstitial lung disease, chronic respiratory failure with hypoxia.  Patient presented secondary to shortness of breath with initial concern for possible pneumonia.  Patient was started empirically on antibiotics however these were discontinued secondary to information pointing to non infectious etiology of dyspnea.  Pulmonology consulted.  Lasix  continued for diuresis. Symptoms improved.  Assessment and Plan:  Chronic respiratory failure with hypoxia Patient noted to have true oxygen  baseline of 4-5 L/min. Patient requiring 5 L/min of supplemental oxygen  on admission. Chest x-ray this admission suggests possible pneumonia, however prior imaging seems to suggest this area of reality is likely chronic. Patient has had multiple readmissions over the last month and a half.  Procalcitonin was obtained and is negative.  No leukocytosis.  Afebrile.  Unlikely Monia as etiology.  Possible progressive worsening of underlying lung disease.  BNP is slightly elevated which could indicate possible fluid overload. Procalcitonin <0.1; antibiotics discontinued secondary to unlikely pneumonia. Pulmonology consulted with no further recommendations; outpatient follow-up.    COPD ILD Patient is followed by pulmonology as an outpatient but has been nonadherent with follow-up visits.  Patient states he is unaware of why he is unable to follow-up, however wife states that patient chooses not to go. Patient seen by palliative care medicine last admission with recommendation for outpatient palliative care. Continue home regimen on discharge.   Peripheral neuropathy Continue gabapentin  TID   Depression Anxiety Continue Lexapro  and Ativan    Consultants:  Pulmonology   Procedures:  None  Disposition: Home Diet recommendation: Regular diet   DISCHARGE MEDICATION: Allergies as of 10/22/2023       Reactions   Ambien  [zolpidem ] Anxiety, Other (See Comments)   Anxiety and agitation when tried as sleeper in hospital aug 2019        Medication List     TAKE these medications    acetaminophen  325 MG tablet Commonly known as: TYLENOL  Take 2 tablets (650 mg total) by mouth every 6 (six) hours as needed for mild pain (pain score 1-3) (or Fever >/= 101).   albuterol  (2.5 MG/3ML) 0.083% nebulizer solution Commonly known as: PROVENTIL  Take 3 mLs (2.5 mg total) by nebulization every 6 (six) hours as needed for wheezing or shortness of breath. What changed: Another medication with the same name was changed. Make sure you understand how and when to take each.   albuterol  108 (90 Base) MCG/ACT inhaler Commonly known as: VENTOLIN  HFA INHALE 2 PUFFS BY MOUTH EVERY 6 HOURS AS NEEDED FOR WHEEZING OR SHORTNESS OF BREATH What changed: See the new instructions.   budesonide  0.25 MG/2ML nebulizer solution Commonly known as: PULMICORT  Take 2 mLs (0.25 mg total) by nebulization 2 (two) times daily. What changed: when to take this   escitalopram  20 MG tablet Commonly known as: LEXAPRO  TAKE 1 TABLET BY MOUTH DAILY  furosemide  20 MG tablet Commonly known as: LASIX  Take 1 tablet (20 mg total) by mouth daily. What changed:  when to take this reasons to take  this   gabapentin  300 MG capsule Commonly known as: NEURONTIN  Take 1 capsule (300 mg total) by mouth 3 (three) times daily.   ipratropium-albuterol  0.5-2.5 (3) MG/3ML Soln Commonly known as: DUONEB Take 3 mLs by nebulization every 6 (six) hours as needed. What changed: reasons to take this   LORazepam  0.5 MG tablet Commonly known as: ATIVAN  Take 1 tablet (0.5 mg total) by mouth daily as needed for anxiety (do not take within 8 hours of bed. do not drive for 8 hours after taking.).   ondansetron  4 MG tablet Commonly known as: ZOFRAN  Take 1 tablet (4 mg total) by mouth every 6 (six) hours as needed for nausea.   traMADol  50 MG tablet Commonly known as: ULTRAM  Take 1 tablet (50 mg total) by mouth every 6 (six) hours as needed (prefer max 3 per day). What changed: reasons to take this   traZODone  50 MG tablet Commonly known as: DESYREL  TAKE 1/2 TO 1 TABLET BY MOUTH EVERY NIGHT AT BEDTIME AS NEEDED FOR SLEEP What changed: See the new instructions.        Follow-up Information     Almira Jaeger, MD. Schedule an appointment as soon as possible for a visit in 1 week(s).   Specialty: Family Medicine Why: For hospital follow-up Contact information: [address] [place] [address] [phone]                Discharge Exam: BP 109/69 (BP Location: Left Arm)   Pulse 75   Temp 97.6 F (36.4 C) (Oral)   Resp 18   Ht 5\' 11"  (1.803 m)   Wt 101.6 kg   SpO2 95%   BMI 31.24 kg/m   General exam: Appears calm and comfortable Respiratory system: Diminished on auscultation. Respiratory effort normal. Cardiovascular system: S1 & S2 heard, RRR. No murmurs. Gastrointestinal system: Abdomen is nondistended, soft and nontender. Central nervous system: Alert and oriented. No focal neurological deficits. Psychiatry: Judgement and insight appear normal. Mood & affect appropriate.   Condition at discharge: stable  The results of significant diagnostics from  this hospitalization (including imaging, microbiology, ancillary and laboratory) are listed below for reference.   Imaging Studies: DG Chest 2 View Result Date: 10/19/2023 CLINICAL DATA:  Shortness of breath for the past 2 days. EXAM: CHEST - 2 VIEW COMPARISON:  09/13/2023 FINDINGS: A poor inspiration is again demonstrated. No gross change in enlargement of the cardiac silhouette. Tortuous and partially calcified thoracic aorta. Interval mild patchy density throughout the right lung with no significant change in a small right pleural effusion. Dense left lower lobe opacity without significant change. Probable small left pleural effusion. Thoracic spine degenerative changes. Cholecystectomy clips. IMPRESSION: 1. Interval mild patchy density throughout the right lung, suspicious for pneumonia. 2. No significant change in dense left lower lobe opacity, which could represent atelectasis or pneumonia. 3. No significant change in cardiomegaly and small bilateral pleural effusions. Electronically Signed   By: Catherin Closs M.D.   On: 10/19/2023 16:18    Microbiology: Results for orders placed or performed during the hospital encounter of 10/19/23  Resp panel by RT-PCR (RSV, Flu A&B, Covid) Anterior Nasal Swab     Status: None   Collection Time: 10/19/23  3:33 PM   Specimen: Anterior Nasal Swab  Result Value Ref Range Status   SARS Coronavirus  2 by RT PCR NEGATIVE NEGATIVE Final    Comment: (NOTE) SARS-CoV-2 target nucleic acids are NOT DETECTED.  The SARS-CoV-2 RNA is generally detectable in upper respiratory specimens during the acute phase of infection. The lowest concentration of SARS-CoV-2 viral copies this assay can detect is 138 copies/mL. A negative result does not preclude SARS-Cov-2 infection and should not be used as the sole basis for treatment or other patient management decisions. A negative result may occur with  improper specimen collection/handling, submission of specimen other than  nasopharyngeal swab, presence of viral mutation(s) within the areas targeted by this assay, and inadequate number of viral copies(<138 copies/mL). A negative result must be combined with clinical observations, patient history, and epidemiological information. The expected result is Negative.  Fact Sheet for Patients:  BloggerCourse.com  Fact Sheet for Healthcare Providers:  SeriousBroker.it  This test is no t yet approved or cleared by the [place]  FDA and  has been authorized for detection and/or diagnosis of SARS-CoV-2 by FDA under an Emergency Use Authorization (EUA). This EUA will remain  in effect (meaning this test can be used) for the duration of the COVID-19 declaration under Section 564(b)(1) of the Act, 21 U.S.C.section 360bbb-3(b)(1), unless the authorization is terminated  or revoked sooner.       Influenza A by PCR NEGATIVE NEGATIVE Final   Influenza B by PCR NEGATIVE NEGATIVE Final    Comment: (NOTE) The Xpert Xpress SARS-CoV-2/FLU/RSV plus assay is intended as an aid in the diagnosis of influenza from Nasopharyngeal swab specimens and should not be used as a sole basis for treatment. Nasal washings and aspirates are unacceptable for Xpert Xpress SARS-CoV-2/FLU/RSV testing.  Fact Sheet for Patients: BloggerCourse.com  Fact Sheet for Healthcare Providers: SeriousBroker.it  This test is not yet approved or cleared by the [place]  FDA and has been authorized for detection and/or diagnosis of SARS-CoV-2 by FDA under an Emergency Use Authorization (EUA). This EUA will remain in effect (meaning this test can be used) for the duration of the COVID-19 declaration under Section 564(b)(1) of the Act, 21 U.S.C. section 360bbb-3(b)(1), unless the authorization is terminated or revoked.     Resp Syncytial Virus by PCR NEGATIVE NEGATIVE Final    Comment:  (NOTE) Fact Sheet for Patients: BloggerCourse.com  Fact Sheet for Healthcare Providers: SeriousBroker.it  This test is not yet approved or cleared by the [place]  FDA and has been authorized for detection and/or diagnosis of SARS-CoV-2 by FDA under an Emergency Use Authorization (EUA). This EUA will remain in effect (meaning this test can be used) for the duration of the COVID-19 declaration under Section 564(b)(1) of the Act, 21 U.S.C. section 360bbb-3(b)(1), unless the authorization is terminated or revoked.  Performed at [facility], [address], [place], [place] [address]   Respiratory (~20 pathogens) panel by PCR     Status: None   Collection Time: 10/20/23  3:45 PM   Specimen: Nasopharyngeal Swab; Respiratory  Result Value Ref Range Status   Adenovirus NOT DETECTED NOT DETECTED Final   Coronavirus 229E NOT DETECTED NOT DETECTED Final    Comment: (NOTE) The Coronavirus on the Respiratory Panel, DOES NOT test for the novel  Coronavirus (2019 nCoV)    Coronavirus HKU1 NOT DETECTED NOT DETECTED Final   Coronavirus NL63 NOT DETECTED NOT DETECTED Final   Coronavirus OC43 NOT DETECTED NOT DETECTED Final   Metapneumovirus NOT DETECTED NOT DETECTED Final   Rhinovirus / Enterovirus NOT DETECTED NOT DETECTED Final   Influenza A  NOT DETECTED NOT DETECTED Final   Influenza B NOT DETECTED NOT DETECTED Final   Parainfluenza Virus 1 NOT DETECTED NOT DETECTED Final   Parainfluenza Virus 2 NOT DETECTED NOT DETECTED Final   Parainfluenza Virus 3 NOT DETECTED NOT DETECTED Final   Parainfluenza Virus 4 NOT DETECTED NOT DETECTED Final   Respiratory Syncytial Virus NOT DETECTED NOT DETECTED Final   Bordetella pertussis NOT DETECTED NOT DETECTED Final   Bordetella Parapertussis NOT DETECTED NOT DETECTED Final   Chlamydophila pneumoniae NOT DETECTED NOT DETECTED Final   Mycoplasma pneumoniae NOT DETECTED NOT DETECTED  Final    Comment: Performed at [facility], [address] [place], [place] [address]   *Note: Due to a large number of results and/or encounters for the requested time period, some results have not been displayed. A complete set of results can be found in Results Review.    Labs: CBC: Recent Labs  Lab 10/19/23 1528  WBC 6.9  NEUTROABS 5.2  HGB 12.7*  HCT 40.8  MCV 95.8  PLT 243   Basic Metabolic Panel: Recent Labs  Lab 10/19/23 1528 10/21/23 0546 10/22/23 0443  NA 138 134* 134*  K 4.2 3.7 3.8  CL 106 100 96*  CO2 28 27 31   GLUCOSE 96 105* 101*  BUN 13 18 17   CREATININE 0.86 0.98 1.00  CALCIUM 8.4* 8.2* 8.2*   Liver Function Tests: Recent Labs  Lab 10/19/23 1528  AST 25  ALT 18  ALKPHOS 68  BILITOT 0.8  PROT 7.4  ALBUMIN  3.0*    Discharge time spent: 35 minutes.  Signed: Aneita Keens, MD [facility] 10/22/2023

## 2023-10-22 NOTE — Discharge Instructions (Signed)
 Wayne Booth,  You were in the hospital with breathing troubles. This has improved since admission. You will need to follow-up closely with your lung doctor.

## 2023-10-22 NOTE — Telephone Encounter (Signed)
 Called and scheduled patient with Sueanne Emerald on 6/6 at 11:30 A.M.

## 2023-10-22 NOTE — TOC Transition Note (Signed)
 Transition of Care [facility]) - Discharge Note   Patient Details  Name: Wayne Booth MRN: [id number] Date of Birth: 11-05-1940  [facility] CM/SW Contact:  Marty Sleet, LCSW Phone Number: 10/22/2023, 11:57 AM   Clinical Narrative:    Pt to return home with spouse and continue HHPT/OT w/ [facility]. HH orders in place. Spoke with pt and spouse and confirmed discharge plans. Pt's spouse to provide transportation at discharge.    Final next level of care: Home w Home Health Services Barriers to Discharge: Continued Medical Work up   Patient Goals and CMS Choice Patient states their goals for this hospitalization and ongoing recovery are:: To go home and continue with services through home health CMS Medicare.gov Compare Post Acute Care list provided to:: Patient Choice offered to / list presented to : NA [place] ownership interest in [facility].provided to:: Parent NA    Discharge Placement                       Discharge Plan and Services Additional resources added to the After Visit Summary for     Discharge Planning Services: CM Consult Post Acute Care Choice: Home Health, Durable Medical Equipment          DME Arranged: N/A DME Agency: NA                  Social Drivers of Health (SDOH) Interventions SDOH Screenings   Food Insecurity: No Food Insecurity (10/19/2023)  Housing: Low Risk  (10/19/2023)  Transportation Needs: No Transportation Needs (10/19/2023)  Utilities: Not At Risk (10/19/2023)  Depression (PHQ2-9): High Risk (02/11/2023)  Financial Resource Strain: Low Risk  (01/04/2023)  Physical Activity: Inactive (01/04/2023)  Social Connections: Moderately Integrated (10/19/2023)  Stress: No Stress Concern Present (01/04/2023)  Tobacco Use: Low Risk  (10/19/2023)     Readmission Risk Interventions    10/20/2023   11:54 AM  Readmission Risk Prevention Plan  Transportation Screening Complete  PCP or Specialist Appt within 5-7  Days Complete  Home Care Screening Complete  Medication Review (RN CM) Complete

## 2023-10-25 ENCOUNTER — Telehealth: Payer: Self-pay | Admitting: Family Medicine

## 2023-10-25 ENCOUNTER — Telehealth: Payer: Self-pay | Admitting: *Deleted

## 2023-10-25 NOTE — Telephone Encounter (Unsigned)
 Copied from CRM [id number]. Topic: Clinical - Medical Advice >> Oct 25, 2023  1:15 PM Danae Duncans wrote: Reason for CRM: Pt wife would like callback [phone]- Pt is being discharged from hospital - would like to go to a rehab

## 2023-10-25 NOTE — Transitions of Care (Post Inpatient/ED Visit) (Signed)
   10/25/2023  Name: BRENHAM RAYNER MRN: [id number] DOB: 02/26/1941  Today's TOC FU Call Status: Today's TOC FU Call Status:: Unsuccessful Call (1st Attempt) Unsuccessful Call (1st Attempt) Date: 10/25/23  Attempted to reach the patient regarding the most recent Inpatient/ED visit.  Follow Up Plan: Additional outreach attempts will be made to reach the patient to complete the Transitions of Care (Post Inpatient/ED visit) call.   Cecilie Coffee [facility], BSN RN Care Manager/ Transition of Care [place]/ [facility] [phone]

## 2023-10-26 ENCOUNTER — Telehealth: Payer: Self-pay

## 2023-10-26 ENCOUNTER — Telehealth: Payer: Self-pay | Admitting: Family Medicine

## 2023-10-26 DIAGNOSIS — I2510 Atherosclerotic heart disease of native coronary artery without angina pectoris: Secondary | ICD-10-CM | POA: Diagnosis not present

## 2023-10-26 DIAGNOSIS — J449 Chronic obstructive pulmonary disease, unspecified: Secondary | ICD-10-CM | POA: Diagnosis not present

## 2023-10-26 DIAGNOSIS — I4891 Unspecified atrial fibrillation: Secondary | ICD-10-CM | POA: Diagnosis not present

## 2023-10-26 DIAGNOSIS — J9621 Acute and chronic respiratory failure with hypoxia: Secondary | ICD-10-CM | POA: Diagnosis not present

## 2023-10-26 NOTE — Telephone Encounter (Signed)
 Unsure of what this referral would fall under? OT/PT?

## 2023-10-26 NOTE — Telephone Encounter (Signed)
 Inpatient rehab is usually a direct referral from the hospital and they determine whether he qualifies or not-I have never seen someone referred back into the hospital once discharged though I am certainly open at this  I am going to reach out to Dionne Leath, RN- I certainly think this would help him with strength and reduce risk of rehospitalization but I am not sure how to set this up and also not sure if patient would even agree to this-unclear if [facility] actually discussed this with patient per the note alone

## 2023-10-26 NOTE — Telephone Encounter (Unsigned)
 Copied from CRM [id number]. Topic: General - Other >> Oct 26, 2023  4:03 PM Jenice Mitts wrote: Reason for CRM: Mr. linland calling from [facility] to let Dr hunter know that the patient resumed home health after being hospitalized with Community-Acquired Pneumonia. They are recommended us  to make a referral for an inpatient acute [facility]hab [facility] [place]  CB#[phone]

## 2023-10-26 NOTE — Transitions of Care (Post Inpatient/ED Visit) (Signed)
   10/26/2023  Name: Wayne Booth MRN: [id number] DOB: [birth year]  Today's TOC FU Call Status: Today's TOC FU Call Status:: Unsuccessful Call (2nd Attempt) Unsuccessful Call (2nd Attempt) Date: 10/26/23  Attempted to reach the patient regarding the most recent Inpatient/ED visit.  Follow Up Plan: Additional outreach attempts will be made to reach the patient to complete the Transitions of Care (Post Inpatient/ED visit) call.   Ibrahim Mcpheeters J. Kimble Hitchens RN, MSN [facility], [facility] RN Care Manager Direct Dial: [phone]  Fax: [phone] Website: [email or web address]

## 2023-10-27 ENCOUNTER — Telehealth: Payer: Self-pay | Admitting: *Deleted

## 2023-10-27 NOTE — Telephone Encounter (Signed)
 Dionne- this is SUPER helpful- thank you  Team can you please deliver this message to requesting agency and/or patient directly

## 2023-10-27 NOTE — Transitions of Care (Post Inpatient/ED Visit) (Signed)
   10/27/2023  Name: Wayne Booth MRN: [id number] DOB: Sep 04, 1940  Today's TOC FU Call Status: Today's TOC FU Call Status:: Unsuccessful Call (3rd Attempt) Unsuccessful Call (3rd Attempt) Date: 10/27/23  Attempted to reach the patient regarding the most recent Inpatient/ED visit.  Follow Up Plan: No further outreach attempts will be made at this time. We have been unable to contact the patient.  Cecilie Coffee [facility], BSN RN Care Manager/ Transition of Care [place]/ [facility] [phone]

## 2023-10-28 ENCOUNTER — Telehealth: Payer: Self-pay | Admitting: Family Medicine

## 2023-10-28 NOTE — Telephone Encounter (Signed)
 Received faxed  document Home Health Certificate (Order ID [id number] ), to be filled out by provider. Patient requested to send it back via Fax  Document is located in providers tray at front office.Please advise

## 2023-10-28 NOTE — Telephone Encounter (Signed)
 Called and lm on Mr. Inland vm with below message.

## 2023-11-01 ENCOUNTER — Telehealth: Payer: Self-pay

## 2023-11-01 DIAGNOSIS — J449 Chronic obstructive pulmonary disease, unspecified: Secondary | ICD-10-CM | POA: Diagnosis not present

## 2023-11-01 DIAGNOSIS — J9621 Acute and chronic respiratory failure with hypoxia: Secondary | ICD-10-CM | POA: Diagnosis not present

## 2023-11-01 DIAGNOSIS — I2510 Atherosclerotic heart disease of native coronary artery without angina pectoris: Secondary | ICD-10-CM | POA: Diagnosis not present

## 2023-11-01 DIAGNOSIS — I4891 Unspecified atrial fibrillation: Secondary | ICD-10-CM | POA: Diagnosis not present

## 2023-11-01 DIAGNOSIS — J9611 Chronic respiratory failure with hypoxia: Secondary | ICD-10-CM | POA: Diagnosis not present

## 2023-11-01 DIAGNOSIS — J849 Interstitial pulmonary disease, unspecified: Secondary | ICD-10-CM | POA: Diagnosis not present

## 2023-11-01 NOTE — Telephone Encounter (Signed)
 Forms completed and faxed back.

## 2023-11-01 NOTE — Telephone Encounter (Signed)
 Left pt wife message to return call as I did advise pulmonary rehab is outpatient

## 2023-11-01 NOTE — Telephone Encounter (Signed)
 Copied from CRM [id number]. Topic: Clinical - Medical Advice >> Oct 28, 2023  3:51 PM Crist Dominion wrote: Reason for CRM: Patients wife Mia [name] states the patient has been in and out of the hospital 3 times and desperately needs pulmonary rehab as she can't take care of him due to her own health issues. Mia [name] also states they pushed the rehab on the pulmonologist at the hospital but they did nothing about it. Please call Mia [name] back as soon as possible to advise her on this matter at [phone]   Routing to Jamie

## 2023-11-03 DIAGNOSIS — J9621 Acute and chronic respiratory failure with hypoxia: Secondary | ICD-10-CM | POA: Diagnosis not present

## 2023-11-03 DIAGNOSIS — J849 Interstitial pulmonary disease, unspecified: Secondary | ICD-10-CM | POA: Diagnosis not present

## 2023-11-03 DIAGNOSIS — I4891 Unspecified atrial fibrillation: Secondary | ICD-10-CM | POA: Diagnosis not present

## 2023-11-03 DIAGNOSIS — I2510 Atherosclerotic heart disease of native coronary artery without angina pectoris: Secondary | ICD-10-CM | POA: Diagnosis not present

## 2023-11-03 DIAGNOSIS — J9611 Chronic respiratory failure with hypoxia: Secondary | ICD-10-CM | POA: Diagnosis not present

## 2023-11-03 DIAGNOSIS — J449 Chronic obstructive pulmonary disease, unspecified: Secondary | ICD-10-CM | POA: Diagnosis not present

## 2023-11-08 DIAGNOSIS — J9621 Acute and chronic respiratory failure with hypoxia: Secondary | ICD-10-CM | POA: Diagnosis not present

## 2023-11-08 DIAGNOSIS — J849 Interstitial pulmonary disease, unspecified: Secondary | ICD-10-CM | POA: Diagnosis not present

## 2023-11-08 DIAGNOSIS — J449 Chronic obstructive pulmonary disease, unspecified: Secondary | ICD-10-CM | POA: Diagnosis not present

## 2023-11-08 DIAGNOSIS — J9611 Chronic respiratory failure with hypoxia: Secondary | ICD-10-CM | POA: Diagnosis not present

## 2023-11-08 DIAGNOSIS — I2510 Atherosclerotic heart disease of native coronary artery without angina pectoris: Secondary | ICD-10-CM | POA: Diagnosis not present

## 2023-11-08 DIAGNOSIS — I4891 Unspecified atrial fibrillation: Secondary | ICD-10-CM | POA: Diagnosis not present

## 2023-11-10 DIAGNOSIS — I2510 Atherosclerotic heart disease of native coronary artery without angina pectoris: Secondary | ICD-10-CM | POA: Diagnosis not present

## 2023-11-10 DIAGNOSIS — J9611 Chronic respiratory failure with hypoxia: Secondary | ICD-10-CM | POA: Diagnosis not present

## 2023-11-10 DIAGNOSIS — J9621 Acute and chronic respiratory failure with hypoxia: Secondary | ICD-10-CM | POA: Diagnosis not present

## 2023-11-10 DIAGNOSIS — J849 Interstitial pulmonary disease, unspecified: Secondary | ICD-10-CM | POA: Diagnosis not present

## 2023-11-10 DIAGNOSIS — J449 Chronic obstructive pulmonary disease, unspecified: Secondary | ICD-10-CM | POA: Diagnosis not present

## 2023-11-10 DIAGNOSIS — I4891 Unspecified atrial fibrillation: Secondary | ICD-10-CM | POA: Diagnosis not present

## 2023-11-12 DIAGNOSIS — J449 Chronic obstructive pulmonary disease, unspecified: Secondary | ICD-10-CM | POA: Diagnosis not present

## 2023-11-12 DIAGNOSIS — J9611 Chronic respiratory failure with hypoxia: Secondary | ICD-10-CM | POA: Diagnosis not present

## 2023-11-12 DIAGNOSIS — J849 Interstitial pulmonary disease, unspecified: Secondary | ICD-10-CM | POA: Diagnosis not present

## 2023-11-12 DIAGNOSIS — J9621 Acute and chronic respiratory failure with hypoxia: Secondary | ICD-10-CM | POA: Diagnosis not present

## 2023-11-12 DIAGNOSIS — I4891 Unspecified atrial fibrillation: Secondary | ICD-10-CM | POA: Diagnosis not present

## 2023-11-12 DIAGNOSIS — I2510 Atherosclerotic heart disease of native coronary artery without angina pectoris: Secondary | ICD-10-CM | POA: Diagnosis not present

## 2023-11-13 DIAGNOSIS — I2510 Atherosclerotic heart disease of native coronary artery without angina pectoris: Secondary | ICD-10-CM | POA: Diagnosis not present

## 2023-11-13 DIAGNOSIS — J9621 Acute and chronic respiratory failure with hypoxia: Secondary | ICD-10-CM | POA: Diagnosis not present

## 2023-11-13 DIAGNOSIS — J449 Chronic obstructive pulmonary disease, unspecified: Secondary | ICD-10-CM | POA: Diagnosis not present

## 2023-11-13 DIAGNOSIS — J9611 Chronic respiratory failure with hypoxia: Secondary | ICD-10-CM | POA: Diagnosis not present

## 2023-11-13 DIAGNOSIS — I4891 Unspecified atrial fibrillation: Secondary | ICD-10-CM | POA: Diagnosis not present

## 2023-11-13 DIAGNOSIS — J849 Interstitial pulmonary disease, unspecified: Secondary | ICD-10-CM | POA: Diagnosis not present

## 2023-11-15 ENCOUNTER — Telehealth: Payer: Self-pay

## 2023-11-15 DIAGNOSIS — J449 Chronic obstructive pulmonary disease, unspecified: Secondary | ICD-10-CM | POA: Diagnosis not present

## 2023-11-15 DIAGNOSIS — J9611 Chronic respiratory failure with hypoxia: Secondary | ICD-10-CM | POA: Diagnosis not present

## 2023-11-15 DIAGNOSIS — J9621 Acute and chronic respiratory failure with hypoxia: Secondary | ICD-10-CM | POA: Diagnosis not present

## 2023-11-15 DIAGNOSIS — I2510 Atherosclerotic heart disease of native coronary artery without angina pectoris: Secondary | ICD-10-CM | POA: Diagnosis not present

## 2023-11-15 DIAGNOSIS — J849 Interstitial pulmonary disease, unspecified: Secondary | ICD-10-CM | POA: Diagnosis not present

## 2023-11-15 DIAGNOSIS — I4891 Unspecified atrial fibrillation: Secondary | ICD-10-CM | POA: Diagnosis not present

## 2023-11-15 NOTE — Telephone Encounter (Signed)
 Called and spoke with [facility] and VO given.  Copied from CRM [id number]. Topic: Clinical - Home Health Verbal Orders >> Nov 15, 2023  3:02 PM Danae Duncans wrote: Caller/Agency: [facility] Callback Number: [phone] Service Requested: Recertify Physical Therapy Frequency: 1 week 4  Any new concerns about the patient? No

## 2023-11-16 DIAGNOSIS — J449 Chronic obstructive pulmonary disease, unspecified: Secondary | ICD-10-CM | POA: Diagnosis not present

## 2023-11-16 DIAGNOSIS — I2510 Atherosclerotic heart disease of native coronary artery without angina pectoris: Secondary | ICD-10-CM | POA: Diagnosis not present

## 2023-11-16 DIAGNOSIS — J9611 Chronic respiratory failure with hypoxia: Secondary | ICD-10-CM | POA: Diagnosis not present

## 2023-11-16 DIAGNOSIS — J9621 Acute and chronic respiratory failure with hypoxia: Secondary | ICD-10-CM | POA: Diagnosis not present

## 2023-11-16 DIAGNOSIS — I4891 Unspecified atrial fibrillation: Secondary | ICD-10-CM | POA: Diagnosis not present

## 2023-11-16 DIAGNOSIS — J849 Interstitial pulmonary disease, unspecified: Secondary | ICD-10-CM | POA: Diagnosis not present

## 2023-11-17 ENCOUNTER — Telehealth: Payer: Self-pay

## 2023-11-17 DIAGNOSIS — J449 Chronic obstructive pulmonary disease, unspecified: Secondary | ICD-10-CM | POA: Diagnosis not present

## 2023-11-17 DIAGNOSIS — J849 Interstitial pulmonary disease, unspecified: Secondary | ICD-10-CM | POA: Diagnosis not present

## 2023-11-17 DIAGNOSIS — J9621 Acute and chronic respiratory failure with hypoxia: Secondary | ICD-10-CM | POA: Diagnosis not present

## 2023-11-17 DIAGNOSIS — I4891 Unspecified atrial fibrillation: Secondary | ICD-10-CM | POA: Diagnosis not present

## 2023-11-17 DIAGNOSIS — J9611 Chronic respiratory failure with hypoxia: Secondary | ICD-10-CM | POA: Diagnosis not present

## 2023-11-17 DIAGNOSIS — I2510 Atherosclerotic heart disease of native coronary artery without angina pectoris: Secondary | ICD-10-CM | POA: Diagnosis not present

## 2023-11-17 NOTE — Telephone Encounter (Signed)
 Reached out to number below and no identifier on vm so unsure if confidential or not. Lm if this was Archie Bearded vm tcb so that I can provider VO.  Copied from CRM [id number]. Topic: Clinical - Home Health Verbal Orders >> Nov 17, 2023 10:52 AM Artemio Larry wrote: Caller/Agency: Archie Bearded from [facility]  Callback Number: [phone] Service Requested: Social Worker for community resources  Frequency: 1 additionally visit  Any new concerns about the patient? No

## 2023-11-18 ENCOUNTER — Telehealth: Payer: Self-pay

## 2023-11-18 DIAGNOSIS — J9621 Acute and chronic respiratory failure with hypoxia: Secondary | ICD-10-CM | POA: Diagnosis not present

## 2023-11-18 DIAGNOSIS — J849 Interstitial pulmonary disease, unspecified: Secondary | ICD-10-CM | POA: Diagnosis not present

## 2023-11-18 DIAGNOSIS — I4891 Unspecified atrial fibrillation: Secondary | ICD-10-CM | POA: Diagnosis not present

## 2023-11-18 DIAGNOSIS — J9611 Chronic respiratory failure with hypoxia: Secondary | ICD-10-CM | POA: Diagnosis not present

## 2023-11-18 DIAGNOSIS — J449 Chronic obstructive pulmonary disease, unspecified: Secondary | ICD-10-CM | POA: Diagnosis not present

## 2023-11-18 DIAGNOSIS — I2510 Atherosclerotic heart disease of native coronary artery without angina pectoris: Secondary | ICD-10-CM | POA: Diagnosis not present

## 2023-11-18 NOTE — Telephone Encounter (Signed)
 Called and spoke with Archie Bearded and vo given.  Copied from CRM [id number]. Topic: Clinical - Home Health Verbal Orders >> Nov 17, 2023 10:52 AM Artemio Larry wrote: Caller/Agency: Archie Bearded from [facility]  Callback Number: [phone] Service Requested: Social Worker for community resources  Frequency: 1 additionally visit  Any new concerns about the patient? No >> Nov 17, 2023 12:30 PM Ivette P wrote: Archie Bearded called in due to a missed call from [place], called [facility] and Jenel Mires was on lunch. Please reach out to Archie Bearded, if voicemail is needing to be left. Voicemail is secured.

## 2023-11-19 DIAGNOSIS — J9611 Chronic respiratory failure with hypoxia: Secondary | ICD-10-CM | POA: Diagnosis not present

## 2023-11-19 DIAGNOSIS — J849 Interstitial pulmonary disease, unspecified: Secondary | ICD-10-CM | POA: Diagnosis not present

## 2023-11-19 DIAGNOSIS — J449 Chronic obstructive pulmonary disease, unspecified: Secondary | ICD-10-CM | POA: Diagnosis not present

## 2023-11-19 DIAGNOSIS — I4891 Unspecified atrial fibrillation: Secondary | ICD-10-CM | POA: Diagnosis not present

## 2023-11-19 DIAGNOSIS — I2510 Atherosclerotic heart disease of native coronary artery without angina pectoris: Secondary | ICD-10-CM | POA: Diagnosis not present

## 2023-11-19 DIAGNOSIS — J9621 Acute and chronic respiratory failure with hypoxia: Secondary | ICD-10-CM | POA: Diagnosis not present

## 2023-11-23 ENCOUNTER — Other Ambulatory Visit (HOSPITAL_COMMUNITY): Payer: Self-pay

## 2023-11-23 DIAGNOSIS — I4891 Unspecified atrial fibrillation: Secondary | ICD-10-CM | POA: Diagnosis not present

## 2023-11-23 DIAGNOSIS — J9621 Acute and chronic respiratory failure with hypoxia: Secondary | ICD-10-CM | POA: Diagnosis not present

## 2023-11-23 DIAGNOSIS — J849 Interstitial pulmonary disease, unspecified: Secondary | ICD-10-CM | POA: Diagnosis not present

## 2023-11-23 DIAGNOSIS — J9611 Chronic respiratory failure with hypoxia: Secondary | ICD-10-CM | POA: Diagnosis not present

## 2023-11-23 DIAGNOSIS — J449 Chronic obstructive pulmonary disease, unspecified: Secondary | ICD-10-CM | POA: Diagnosis not present

## 2023-11-23 DIAGNOSIS — I2510 Atherosclerotic heart disease of native coronary artery without angina pectoris: Secondary | ICD-10-CM | POA: Diagnosis not present

## 2023-11-24 DIAGNOSIS — J9611 Chronic respiratory failure with hypoxia: Secondary | ICD-10-CM | POA: Diagnosis not present

## 2023-11-24 DIAGNOSIS — J449 Chronic obstructive pulmonary disease, unspecified: Secondary | ICD-10-CM | POA: Diagnosis not present

## 2023-11-24 DIAGNOSIS — I4891 Unspecified atrial fibrillation: Secondary | ICD-10-CM | POA: Diagnosis not present

## 2023-11-24 DIAGNOSIS — J849 Interstitial pulmonary disease, unspecified: Secondary | ICD-10-CM | POA: Diagnosis not present

## 2023-11-24 DIAGNOSIS — J9621 Acute and chronic respiratory failure with hypoxia: Secondary | ICD-10-CM | POA: Diagnosis not present

## 2023-11-24 DIAGNOSIS — I2510 Atherosclerotic heart disease of native coronary artery without angina pectoris: Secondary | ICD-10-CM | POA: Diagnosis not present

## 2023-11-26 DIAGNOSIS — J9611 Chronic respiratory failure with hypoxia: Secondary | ICD-10-CM | POA: Diagnosis not present

## 2023-11-26 DIAGNOSIS — I2510 Atherosclerotic heart disease of native coronary artery without angina pectoris: Secondary | ICD-10-CM | POA: Diagnosis not present

## 2023-11-26 DIAGNOSIS — J9621 Acute and chronic respiratory failure with hypoxia: Secondary | ICD-10-CM | POA: Diagnosis not present

## 2023-11-26 DIAGNOSIS — J849 Interstitial pulmonary disease, unspecified: Secondary | ICD-10-CM | POA: Diagnosis not present

## 2023-11-26 DIAGNOSIS — J449 Chronic obstructive pulmonary disease, unspecified: Secondary | ICD-10-CM | POA: Diagnosis not present

## 2023-11-26 DIAGNOSIS — I4891 Unspecified atrial fibrillation: Secondary | ICD-10-CM | POA: Diagnosis not present

## 2023-11-28 ENCOUNTER — Emergency Department (HOSPITAL_COMMUNITY)

## 2023-11-28 ENCOUNTER — Inpatient Hospital Stay (HOSPITAL_COMMUNITY)
Admission: EM | Admit: 2023-11-28 | Discharge: 2023-12-04 | DRG: 291 | Disposition: A | Discharge status: Home Health | Attending: Internal Medicine | Admitting: Internal Medicine

## 2023-11-28 ENCOUNTER — Other Ambulatory Visit: Payer: Self-pay

## 2023-11-28 ENCOUNTER — Observation Stay (HOSPITAL_COMMUNITY)

## 2023-11-28 ENCOUNTER — Encounter (HOSPITAL_COMMUNITY): Payer: Self-pay

## 2023-11-28 DIAGNOSIS — R0602 Shortness of breath: Secondary | ICD-10-CM | POA: Diagnosis present

## 2023-11-28 DIAGNOSIS — J440 Chronic obstructive pulmonary disease with acute lower respiratory infection: Secondary | ICD-10-CM | POA: Diagnosis not present

## 2023-11-28 DIAGNOSIS — Z905 Acquired absence of kidney: Secondary | ICD-10-CM | POA: Diagnosis not present

## 2023-11-28 DIAGNOSIS — I5033 Acute on chronic diastolic (congestive) heart failure: Secondary | ICD-10-CM | POA: Diagnosis not present

## 2023-11-28 DIAGNOSIS — U099 Post covid-19 condition, unspecified: Secondary | ICD-10-CM | POA: Diagnosis not present

## 2023-11-28 DIAGNOSIS — F32A Depression, unspecified: Secondary | ICD-10-CM | POA: Diagnosis present

## 2023-11-28 DIAGNOSIS — I2510 Atherosclerotic heart disease of native coronary artery without angina pectoris: Secondary | ICD-10-CM | POA: Diagnosis present

## 2023-11-28 DIAGNOSIS — Z1152 Encounter for screening for COVID-19: Secondary | ICD-10-CM | POA: Diagnosis not present

## 2023-11-28 DIAGNOSIS — I2729 Other secondary pulmonary hypertension: Secondary | ICD-10-CM | POA: Diagnosis not present

## 2023-11-28 DIAGNOSIS — Z91148 Patient's other noncompliance with medication regimen for other reason: Secondary | ICD-10-CM

## 2023-11-28 DIAGNOSIS — Z7951 Long term (current) use of inhaled steroids: Secondary | ICD-10-CM | POA: Diagnosis not present

## 2023-11-28 DIAGNOSIS — J449 Chronic obstructive pulmonary disease, unspecified: Secondary | ICD-10-CM | POA: Diagnosis not present

## 2023-11-28 DIAGNOSIS — F419 Anxiety disorder, unspecified: Secondary | ICD-10-CM | POA: Diagnosis present

## 2023-11-28 DIAGNOSIS — I110 Hypertensive heart disease with heart failure: Secondary | ICD-10-CM | POA: Diagnosis not present

## 2023-11-28 DIAGNOSIS — Z6832 Body mass index (BMI) 32.0-32.9, adult: Secondary | ICD-10-CM

## 2023-11-28 DIAGNOSIS — R590 Localized enlarged lymph nodes: Secondary | ICD-10-CM | POA: Diagnosis not present

## 2023-11-28 DIAGNOSIS — E039 Hypothyroidism, unspecified: Secondary | ICD-10-CM | POA: Diagnosis present

## 2023-11-28 DIAGNOSIS — J189 Pneumonia, unspecified organism: Secondary | ICD-10-CM

## 2023-11-28 DIAGNOSIS — I480 Paroxysmal atrial fibrillation: Secondary | ICD-10-CM | POA: Diagnosis present

## 2023-11-28 DIAGNOSIS — R0902 Hypoxemia: Secondary | ICD-10-CM | POA: Diagnosis not present

## 2023-11-28 DIAGNOSIS — R0603 Acute respiratory distress: Secondary | ICD-10-CM | POA: Diagnosis not present

## 2023-11-28 DIAGNOSIS — G4733 Obstructive sleep apnea (adult) (pediatric): Secondary | ICD-10-CM | POA: Diagnosis not present

## 2023-11-28 DIAGNOSIS — J9611 Chronic respiratory failure with hypoxia: Secondary | ICD-10-CM | POA: Diagnosis present

## 2023-11-28 DIAGNOSIS — Z79899 Other long term (current) drug therapy: Secondary | ICD-10-CM | POA: Diagnosis not present

## 2023-11-28 DIAGNOSIS — Z91199 Patient's noncompliance with other medical treatment and regimen due to unspecified reason: Secondary | ICD-10-CM

## 2023-11-28 DIAGNOSIS — E66811 Obesity, class 1: Secondary | ICD-10-CM | POA: Diagnosis present

## 2023-11-28 DIAGNOSIS — Z96653 Presence of artificial knee joint, bilateral: Secondary | ICD-10-CM | POA: Diagnosis present

## 2023-11-28 DIAGNOSIS — G629 Polyneuropathy, unspecified: Secondary | ICD-10-CM | POA: Diagnosis present

## 2023-11-28 DIAGNOSIS — J8410 Pulmonary fibrosis, unspecified: Secondary | ICD-10-CM | POA: Diagnosis present

## 2023-11-28 DIAGNOSIS — I272 Pulmonary hypertension, unspecified: Secondary | ICD-10-CM | POA: Diagnosis present

## 2023-11-28 DIAGNOSIS — I499 Cardiac arrhythmia, unspecified: Secondary | ICD-10-CM | POA: Diagnosis not present

## 2023-11-28 DIAGNOSIS — R918 Other nonspecific abnormal finding of lung field: Secondary | ICD-10-CM | POA: Diagnosis not present

## 2023-11-28 DIAGNOSIS — J90 Pleural effusion, not elsewhere classified: Secondary | ICD-10-CM | POA: Diagnosis not present

## 2023-11-28 DIAGNOSIS — R6889 Other general symptoms and signs: Secondary | ICD-10-CM | POA: Diagnosis not present

## 2023-11-28 DIAGNOSIS — R0989 Other specified symptoms and signs involving the circulatory and respiratory systems: Secondary | ICD-10-CM | POA: Diagnosis not present

## 2023-11-28 LAB — COMPREHENSIVE METABOLIC PANEL WITH GFR
ALT: 17 U/L (ref 0–44)
AST: 24 U/L (ref 15–41)
Albumin: 3.1 g/dL — ABNORMAL LOW (ref 3.5–5.0)
Alkaline Phosphatase: 63 U/L (ref 38–126)
Anion gap: 9 (ref 5–15)
BUN: 10 mg/dL (ref 8–23)
CO2: 28 mmol/L (ref 22–32)
Calcium: 9.1 mg/dL (ref 8.9–10.3)
Chloride: 99 mmol/L (ref 98–111)
Creatinine, Ser: 1.04 mg/dL (ref 0.61–1.24)
GFR, Estimated: >60 mL/min (ref >60)
Glucose, Bld: 83 mg/dL (ref 70–99)
Potassium: 4.2 mmol/L (ref 3.5–5.1)
Sodium: 136 mmol/L (ref 135–145)
Total Bilirubin: 0.8 mg/dL (ref 0.0–1.2)
Total Protein: 8.2 g/dL — ABNORMAL HIGH (ref 6.5–8.1)

## 2023-11-28 LAB — RESP PANEL BY RT-PCR (RSV, FLU A&B, COVID)  RVPGX2
Influenza A by PCR: NEGATIVE
Influenza B by PCR: NEGATIVE
Resp Syncytial Virus by PCR: NEGATIVE
SARS Coronavirus 2 by RT PCR: NEGATIVE

## 2023-11-28 LAB — CBC WITH DIFFERENTIAL/PLATELET
Abs Immature Granulocytes: 0.03 10*3/uL (ref 0.00–0.07)
Basophils Absolute: 0 10*3/uL (ref 0.0–0.1)
Basophils Relative: 1 %
Eosinophils Absolute: 0.2 10*3/uL (ref 0.0–0.5)
Eosinophils Relative: 3 %
HCT: 43.3 % (ref 39.0–52.0)
Hemoglobin: 14.2 g/dL (ref 13.0–17.0)
Immature Granulocytes: 0 %
Lymphocytes Relative: 20 %
Lymphs Abs: 1.7 10*3/uL (ref 0.7–4.0)
MCH: 30.1 pg (ref 26.0–34.0)
MCHC: 32.8 g/dL (ref 30.0–36.0)
MCV: 91.7 fL (ref 80.0–100.0)
Monocytes Absolute: 1 10*3/uL (ref 0.1–1.0)
Monocytes Relative: 12 %
Neutro Abs: 5.4 10*3/uL (ref 1.7–7.7)
Neutrophils Relative %: 64 %
Platelets: 326 10*3/uL (ref 150–400)
RBC: 4.72 MIL/uL (ref 4.22–5.81)
RDW: 14.2 % (ref 11.5–15.5)
WBC: 8.4 10*3/uL (ref 4.0–10.5)
nRBC: 0 % (ref 0.0–0.2)

## 2023-11-28 LAB — TROPONIN I (HIGH SENSITIVITY)
Troponin I (High Sensitivity): 5 ng/L (ref <18)
Troponin I (High Sensitivity): 4 ng/L (ref <18)

## 2023-11-28 LAB — PROCALCITONIN: Procalcitonin: <0.1 ng/mL

## 2023-11-28 LAB — D-DIMER, QUANTITATIVE: D-Dimer, Quant: 1.35 ug{FEU}/mL — ABNORMAL HIGH (ref 0.00–0.50)

## 2023-11-28 LAB — BRAIN NATRIURETIC PEPTIDE: B Natriuretic Peptide: 65.1 pg/mL (ref 0.0–100.0)

## 2023-11-28 MED ORDER — SODIUM CHLORIDE 0.9 % IV SOLN
2.0000 g | Freq: Once | INTRAVENOUS | Status: AC
  Administered 2023-11-28: 2 g via INTRAVENOUS
  Filled 2023-11-28: qty 12.5

## 2023-11-28 MED ORDER — METHYLPREDNISOLONE SODIUM SUCC 40 MG IJ SOLR
40.0000 mg | Freq: Two times a day (BID) | INTRAMUSCULAR | Status: DC
  Administered 2023-11-28: 40 mg via INTRAVENOUS
  Filled 2023-11-28 (×2): qty 1

## 2023-11-28 MED ORDER — ACETAMINOPHEN 650 MG RE SUPP: 650.0000 mg | Freq: Four times a day (QID) | RECTAL | Status: DC | PRN

## 2023-11-28 MED ORDER — VANCOMYCIN HCL IN DEXTROSE 1-5 GM/200ML-% IV SOLN: 1000.0000 mg | Freq: Once | INTRAVENOUS | Status: DC

## 2023-11-28 MED ORDER — VANCOMYCIN HCL 1750 MG/350ML IV SOLN
1750.0000 mg | Freq: Once | INTRAVENOUS | Status: AC
  Administered 2023-11-28: 1750 mg via INTRAVENOUS
  Filled 2023-11-28: qty 350

## 2023-11-28 MED ORDER — VANCOMYCIN HCL 1.25 G IV SOLR
1250.0000 mg | INTRAVENOUS | Status: DC
  Filled 2023-11-28: qty 25

## 2023-11-28 MED ORDER — TRAZODONE HCL 50 MG PO TABS
50.0000 mg | ORAL_TABLET | Freq: Every day | ORAL | Status: DC
  Administered 2023-11-28 – 2023-12-03 (×6): 50 mg via ORAL
  Filled 2023-11-28 (×6): qty 1

## 2023-11-28 MED ORDER — SODIUM CHLORIDE 0.9% FLUSH
3.0000 mL | Freq: Two times a day (BID) | INTRAVENOUS | Status: DC
  Administered 2023-11-28 – 2023-11-29 (×2): 3 mL via INTRAVENOUS
  Administered 2023-11-29 – 2023-11-30 (×2): 10 mL via INTRAVENOUS
  Administered 2023-11-30 – 2023-12-01 (×2): 3 mL via INTRAVENOUS
  Administered 2023-12-01: 10 mL via INTRAVENOUS
  Administered 2023-12-02: 3 mL via INTRAVENOUS
  Administered 2023-12-03 – 2023-12-04 (×2): 10 mL via INTRAVENOUS

## 2023-11-28 MED ORDER — IOHEXOL 350 MG/ML SOLN
65.0000 mL | Freq: Once | INTRAVENOUS | Status: AC | PRN
  Administered 2023-11-28: 65 mL via INTRAVENOUS

## 2023-11-28 MED ORDER — ESCITALOPRAM OXALATE 10 MG PO TABS
20.0000 mg | ORAL_TABLET | Freq: Every day | ORAL | Status: DC
  Administered 2023-11-28 – 2023-12-04 (×7): 20 mg via ORAL
  Filled 2023-11-28 (×7): qty 2

## 2023-11-28 MED ORDER — LORAZEPAM 0.5 MG PO TABS
0.5000 mg | ORAL_TABLET | Freq: Every day | ORAL | Status: DC | PRN
  Filled 2023-11-28: qty 1

## 2023-11-28 MED ORDER — FUROSEMIDE 10 MG/ML IJ SOLN
20.0000 mg | Freq: Once | INTRAMUSCULAR | Status: AC
  Administered 2023-11-28: 20 mg via INTRAVENOUS
  Filled 2023-11-28: qty 2

## 2023-11-28 MED ORDER — SODIUM CHLORIDE 0.9 % IV SOLN
2.0000 g | Freq: Three times a day (TID) | INTRAVENOUS | Status: DC
  Administered 2023-11-29: 2 g via INTRAVENOUS
  Filled 2023-11-28 (×2): qty 12.5

## 2023-11-28 MED ORDER — SODIUM CHLORIDE 0.9% FLUSH: 3.0000 mL | INTRAVENOUS | Status: DC | PRN

## 2023-11-28 MED ORDER — SODIUM CHLORIDE 0.9% FLUSH
3.0000 mL | Freq: Two times a day (BID) | INTRAVENOUS | Status: DC
  Administered 2023-11-28 – 2023-12-03 (×8): 3 mL via INTRAVENOUS

## 2023-11-28 MED ORDER — HEPARIN SODIUM (PORCINE) 5000 UNIT/ML IJ SOLN
5000.0000 [IU] | Freq: Three times a day (TID) | INTRAMUSCULAR | Status: DC
  Administered 2023-11-29 – 2023-12-04 (×15): 5000 [IU] via SUBCUTANEOUS
  Filled 2023-11-28 (×14): qty 1

## 2023-11-28 MED ORDER — GABAPENTIN 300 MG PO CAPS
300.0000 mg | ORAL_CAPSULE | Freq: Three times a day (TID) | ORAL | Status: DC
  Administered 2023-11-28 – 2023-12-04 (×17): 300 mg via ORAL
  Filled 2023-11-28 (×17): qty 1

## 2023-11-28 MED ORDER — ACETAMINOPHEN 325 MG PO TABS
650.0000 mg | ORAL_TABLET | Freq: Four times a day (QID) | ORAL | Status: DC | PRN
Start: 2023-11-28 — End: 2023-12-04

## 2023-11-28 NOTE — ED Notes (Signed)
Assisted pt with urinal

## 2023-11-28 NOTE — ED Triage Notes (Signed)
 PER EMS: pt is from home with c/o sudden onset of shortness of breath. Hx of copd and takes lasix . EMS heard crackles in lower lobes and then placed him on C-pap with improvement in respiratory effort. They gave solumedrol and a duoneb and one nitroglycerin  tab.He is A&OX4. He wears 5LC [place] at baseline and initial sat was 91%.  BP- 138/92, HR-70, O2-98% on CPAP

## 2023-11-28 NOTE — ED Notes (Signed)
 CCMD called and notified

## 2023-11-28 NOTE — Progress Notes (Signed)
 Pharmacy Antibiotic Note  Wayne Booth is a 83 y.o. male admitted on 11/28/2023 presenting with SOB, concern for pna.  Pharmacy has been consulted for cefepime  and vancomycin  dosing.  Vancomycin  1750 mg IV x 1 given in ED  Plan: Vancomycin  1250 mg IV q 24h (eAUC 458) Add MRSA PCR Cefepime  2g IV q 8h Monitor renal function, Cx/PCR and clinical progression to narrow Vancomycin  levels as indicated  Height: 5\' 11"  (180.3 cm) Weight: 101.6 kg (224 lb) IBW/kg (Calculated) : 75.3  Temp (24hrs), Avg:98.5 F (36.9 C), Min:98.5 F (36.9 C), Max:98.5 F (36.9 C)  Recent Labs  Lab 11/28/23 1558  WBC 8.4  CREATININE 1.04    Estimated Creatinine Clearance: 67.6 mL/min (by C-G formula based on SCr of 1.04 mg/dL).    Allergies  Allergen Reactions   Ambien  [Zolpidem ] Anxiety and Other (See Comments)    Anxiety and agitation when tried as sleeper in hospital aug 2019    Trinidad Funk, PharmD, [facility] Clinical Pharmacist ED Pharmacist Phone # [phone] 11/28/2023 6:07 PM

## 2023-11-28 NOTE — H&P (Addendum)
 History and Physical    Patient: Wayne Booth:[id number] DOB: 08-29-1940 DOA: 11/28/2023 DOS: the patient was seen and examined on 11/28/2023 PCP: Almira Jaeger, MD  Patient coming from: Home Chief complaint: Chief Complaint  Patient presents with   Respiratory Distress   HPI:  Wayne Booth is a 83 y.o. 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    Patient had an echocardiogram on August 29, 2023 showing normal systolic function and diastolic function not assessed and was a poor study otherwise.   On initial presentation today patient was on BiPAP is currently on 6 L nasal cannula is stable at bedside in no distress able to communicate give history and is not dyspneic.  Per report patient received Solu-Medrol  and bronchodilators in the EMS.  ED Course: Pt in ed at bedside  is alert/oriented.  Vital signs in the ED were notable for the following:  Vitals:   11/28/23 1558 11/28/23 1559 11/28/23 1731  BP:  120/72   Pulse:  82   Temp:  98.5 F (36.9 C)   Resp:  (!) 24 16  Height: 5\' 11"  (1.803 m)    Weight: 101.6 kg    SpO2:  93%   TempSrc:  Axillary   BMI (Calculated): 31.26    >>ED evaluation thus far shows: Vitals since arrival show patient has been afebrile initially dyspneic with respiration rate improved at 16. CMP within normal limits. BNP of 65.1. CBC within normal limits. D-dimer and troponin ordered and pending. Procalcitonin ordered and pending. Viral panel negative today for flu RSV and COVID.  >>While in the ED patient received the following: Medications  vancomycin  (VANCOREADY) IVPB 1750 mg/350 mL (has no administration in time range)   escitalopram  (LEXAPRO ) tablet 20 mg (has no administration in time range)  gabapentin  (NEURONTIN ) capsule 300 mg (has no administration in time range)  LORazepam  (ATIVAN ) tablet 0.5 mg (has no administration in time range)  traZODone  (DESYREL ) tablet 50 mg (has no administration in time range)  heparin  injection 5,000 Units (has no administration in time range)  sodium chloride  flush (NS) 0.9 % injection 3 mL (has no administration in time range)  acetaminophen  (TYLENOL ) tablet 650 mg (has no administration in time range)    Or  acetaminophen  (TYLENOL ) suppository 650 mg (has no administration in time range)  sodium chloride  flush (NS) 0.9 % injection 3-10 mL (has no administration in time range)  sodium chloride  flush (NS) 0.9 % injection 3-10 mL (has no administration in time range)  ceFEPIme  (MAXIPIME ) 2 g in sodium chloride  0.9 % 100 mL IVPB (has no administration in time range)  Vancomycin  (VANCOCIN ) 1,250 mg in sodium chloride  0.9 % 250 mL IVPB (has no administration in time range)  ceFEPIme  (MAXIPIME ) 2 g in sodium chloride  0.9 % 100 mL IVPB (0 g Intravenous Stopped 11/28/23 1812)   Review of Systems  Respiratory:  Positive for cough and shortness of breath.    Past Medical History:  Diagnosis Date   Allergy    States his nose runs when he is around grease.    Anxiety    Arthritis    Atrial fibrillation (HCC)    Cancer (HCC)    Coronary artery disease  History of total knee replacement    Hypertension    Hypothyroidism    Kidney cancer, primary, with metastasis from kidney to other site [facility])    Kidney stone    OSA (obstructive sleep apnea)    pt does not wear cpap at night   Pituitary cyst (HCC)    Pneumonia    hx of   Prostate cancer [facility])    Past Surgical History:  Procedure Laterality Date   ABLATION OF DYSRHYTHMIC FOCUS  01/28/2018   ANTERIOR CERVICAL DECOMP/DISCECTOMY FUSION N/A 08/29/2012   Procedure: ANTERIOR CERVICAL DECOMPRESSION/DISCECTOMY FUSION 1 LEVEL;   Surgeon: Isadora Mar, MD;  Location: [facility] NEURO ORS;  Service: Neurosurgery;  Laterality: N/A;   APPENDECTOMY     ATRIAL FIBRILLATION ABLATION N/A 01/28/2018   Procedure: ATRIAL FIBRILLATION ABLATION;  Surgeon: Lei Pump, MD;  Location: [facility] INVASIVE CV LAB;  Service: Cardiovascular;  Laterality: N/A;   CARDIAC CATHETERIZATION  2008   clean   CHOLECYSTECTOMY     COLONOSCOPY W/ POLYPECTOMY     CRANIOTOMY N/A 11/08/2012   Procedure: CRANIOTOMY HYPOPHYSECTOMY TRANSNASAL APPROACH;  Surgeon: Augustine Blocker, MD;  Location: [facility] NEURO ORS;  Service: Neurosurgery;  Laterality: N/A;  Transphenoidal Resection of Pituitary Tumor   FINGER SURGERY Left 2017   Dr Aloha Arnold   INSERTION PROSTATE RADIATION SEED     IR THORACENTESIS ASP PLEURAL SPACE W/IMG GUIDE  04/15/2018   KIDNEY STONE SURGERY     KNEE ARTHROSCOPY  2007   left   NEPHRECTOMY Left    POSTERIOR CERVICAL LAMINECTOMY Left 04/24/2015   Procedure: Foraminotomy cervical five - cervical six cervical six - cervical seven left;  Surgeon: Isadora Mar, MD;  Location: [facility] NEURO ORS;  Service: Neurosurgery;  Laterality: Left;   REPLACEMENT TOTAL KNEE BILATERAL     RIGHT/LEFT HEART CATH AND CORONARY ANGIOGRAPHY N/A 07/14/2019   Procedure: RIGHT/LEFT HEART CATH AND CORONARY ANGIOGRAPHY;  Surgeon: Lucendia Rusk, MD;  Location: [facility] INVASIVE CV LAB;  Service: Cardiovascular;  Laterality: N/A;    reports that he has never smoked. He has never used smokeless tobacco. He reports current alcohol  use of about 2.0 standard drinks of alcohol  per week. He reports that he does not use drugs. Allergies  Allergen Reactions   Ambien  [Zolpidem ] Anxiety and Other (See Comments)    Anxiety and agitation when tried as sleeper in hospital aug 2019   Family History  Problem Relation Age of Onset   Cancer Mother        stomach, died when he was young   Cancer Father        ?mouth, died before patient born   Kidney cancer Sister    Colon cancer Brother 21    Gastric cancer Son 99       stomach cancer per pt   Esophageal cancer Neg Hx    Rectal cancer Neg Hx    Stomach cancer Neg Hx    Prior to Admission medications   Medication Sig Start Date End Date Taking? Authorizing Provider  acetaminophen  (TYLENOL ) 325 MG tablet Take 2 tablets (650 mg total) by mouth every 6 (six) hours as needed for mild pain (pain score 1-3) (or Fever >/= 101). 09/17/23   Barbee Lew, MD  albuterol  (PROVENTIL ) (2.5 MG/3ML) 0.083% nebulizer solution Take 3 mLs (2.5 mg total) by nebulization every 6 (six) hours as needed for wheezing or shortness of breath. 10/12/22   Parrett, Macdonald Savoy, NP  albuterol  (VENTOLIN  HFA) 108 (90 Base)  MCG/ACT inhaler INHALE 2 PUFFS BY MOUTH EVERY 6 HOURS AS NEEDED FOR WHEEZING OR SHORTNESS OF BREATH Patient taking differently: Inhale 2 puffs into the lungs every 6 (six) hours as needed for wheezing or shortness of breath. 07/13/23   Parrett, Macdonald Savoy, NP  budesonide  (PULMICORT ) 0.25 MG/2ML nebulizer solution Take 2 mLs (0.25 mg total) by nebulization 2 (two) times daily. Patient taking differently: Take 0.25 mg by nebulization in the morning and at bedtime. 09/07/23   Leona Rake, MD  escitalopram  (LEXAPRO ) 20 MG tablet TAKE 1 TABLET BY MOUTH DAILY 06/07/23   Almira Jaeger, MD  furosemide  (LASIX ) 20 MG tablet Take 1 tablet (20 mg total) by mouth daily. Patient taking differently: Take 20 mg by mouth daily as needed for fluid or edema. 09/08/23   Leona Rake, MD  gabapentin  (NEURONTIN ) 300 MG capsule Take 1 capsule (300 mg total) by mouth 3 (three) times daily. 12/25/22   Almira Jaeger, MD  ipratropium-albuterol  (DUONEB) 0.5-2.5 (3) MG/3ML SOLN Take 3 mLs by nebulization every 6 (six) hours as needed. Patient taking differently: Take 3 mLs by nebulization every 6 (six) hours as needed (wheezing or shortness of breath). 09/07/23   Leona Rake, MD  LORazepam  (ATIVAN ) 0.5 MG tablet Take 1 tablet (0.5 mg total) by mouth daily as needed  for anxiety (do not take within 8 hours of bed. do not drive for 8 hours after taking.). 04/30/23   Almira Jaeger, MD  ondansetron  (ZOFRAN ) 4 MG tablet Take 1 tablet (4 mg total) by mouth every 6 (six) hours as needed for nausea. 09/17/23   Barbee Lew, MD  traMADol  (ULTRAM ) 50 MG tablet Take 1 tablet (50 mg total) by mouth every 6 (six) hours as needed (prefer max 3 per day). Patient taking differently: Take 50 mg by mouth every 6 (six) hours as needed for moderate pain (pain score 4-6) or severe pain (pain score 7-10) (prefer max 3 per day). 01/30/22   Rodney Clamp, MD  traZODone  (DESYREL ) 50 MG tablet TAKE 1/2 TO 1 TABLET BY MOUTH EVERY NIGHT AT BEDTIME AS NEEDED FOR SLEEP Patient taking differently: Take 50 mg by mouth at bedtime. 10/11/23   Christel Cousins, MD                                                                                 Vitals:   11/28/23 1558 11/28/23 1559 11/28/23 1731  BP:  120/72   Pulse:  82   Resp:  (!) 24 16  Temp:  98.5 F (36.9 C)   TempSrc:  Axillary   SpO2:  93%   Weight: 101.6 kg    Height: 5\' 11"  (1.803 m)     Physical Exam Vitals and nursing note reviewed.  Constitutional:      General: He is not in acute distress.    Appearance: He is not ill-appearing.  HENT:     Head: Normocephalic and atraumatic.     Right Ear: Hearing normal.     Left Ear: Hearing normal.     Nose: No nasal deformity.     Mouth/Throat:     Lips: Pink.  Eyes:  General: Lids are normal.     Extraocular Movements: Extraocular movements intact.  Cardiovascular:     Rate and Rhythm: Normal rate and regular rhythm.     Heart sounds: Normal heart sounds.  Pulmonary:     Effort: Pulmonary effort is normal.     Breath sounds: Rales present.  Abdominal:     General: Bowel sounds are normal. There is no distension.     Palpations: Abdomen is soft. There is no mass.     Tenderness: There is no abdominal tenderness.  Musculoskeletal:     Right lower leg: No  edema.     Left lower leg: No edema.  Skin:    General: Skin is warm.  Neurological:     General: No focal deficit present.     Mental Status: He is alert and oriented to person, place, and time.     Cranial Nerves: Cranial nerves 2-12 are intact.  Psychiatric:        Speech: Speech normal.     Labs on Admission: I have personally reviewed following labs and imaging studies CBC: Recent Labs  Lab 11/28/23 1558  WBC 8.4  NEUTROABS 5.4  HGB 14.2  HCT 43.3  MCV 91.7  PLT 326   Basic Metabolic Panel: Recent Labs  Lab 11/28/23 1558  NA 136  K 4.2  CL 99  CO2 28  GLUCOSE 83  BUN 10  CREATININE 1.04  CALCIUM 9.1   GFR: Estimated Creatinine Clearance: 67.6 mL/min (by C-G formula based on SCr of 1.04 mg/dL). Liver Function Tests: Recent Labs  Lab 11/28/23 1558  AST 24  ALT 17  ALKPHOS 63  BILITOT 0.8  PROT 8.2*  ALBUMIN  3.1*   No results for input(s): "LIPASE", "AMYLASE" in the last 168 hours. No results for input(s): "AMMONIA" in the last 168 hours. Coagulation Profile: No results for input(s): "INR", "PROTIME" in the last 168 hours. Cardiac Enzymes: No results for input(s): "CKTOTAL", "CKMB", "CKMBINDEX", "TROPONINI" in the last 168 hours. BNP (last 3 results) No results for input(s): "PROBNP" in the last 8760 hours. HbA1C: No results for input(s): "HGBA1C" in the last 72 hours. CBG: No results for input(s): "GLUCAP" in the last 168 hours. Lipid Profile: No results for input(s): "CHOL", "HDL", "LDLCALC", "TRIG", "CHOLHDL", "LDLDIRECT" in the last 72 hours. Thyroid  Function Tests: No results for input(s): "TSH", "T4TOTAL", "FREET4", "T3FREE", "THYROIDAB" in the last 72 hours. Anemia Panel: No results for input(s): "VITAMINB12", "FOLATE", "FERRITIN", "TIBC", "IRON", "RETICCTPCT" in the last 72 hours. Urine analysis:    Component Value Date/Time   COLORURINE AMBER (A) 04/20/2019 1438   APPEARANCEUR CLEAR 04/20/2019 1438   LABSPEC 1.017 04/20/2019 1438    PHURINE 5.0 04/20/2019 1438   GLUCOSEU NEGATIVE 04/20/2019 1438   HGBUR MODERATE (A) 04/20/2019 1438   HGBUR 2+ 09/22/2007 1444   BILIRUBINUR NEGATIVE 04/20/2019 1438   BILIRUBINUR n 11/07/2015 1417   KETONESUR NEGATIVE 04/20/2019 1438   PROTEINUR NEGATIVE 04/20/2019 1438   UROBILINOGEN 1.0 11/07/2015 1417   UROBILINOGEN 1.0 05/22/2011 1454   NITRITE NEGATIVE 04/20/2019 1438   LEUKOCYTESUR NEGATIVE 04/20/2019 1438   Radiological Exams on Admission: DG Chest Port 1 View Result Date: 11/28/2023 CLINICAL DATA:  SOB EXAM: PORTABLE CHEST - 1 VIEW COMPARISON:  None available. FINDINGS: Low lung volumes. Unchanged small bilateral pleural effusions, slightly larger on the left than the right. Patchy airspace opacities in the right mid and lower lung zones. Minimal retrocardiac airspace opacities. Unchanged cardiomegaly. Tortuous aorta with aortic atherosclerosis. No acute  fracture or destructive lesion. Multilevel degenerative disc disease of the spine. Cholecystectomy clips. IMPRESSION: Small bilateral pleural effusions, slightly larger on the left than the right. Patchy airspace opacities in the right mid lung and both lower lung zones, possibly atelectasis or bronchopneumonia in the correct clinical context. Electronically Signed   By: Rance Burrows M.D.   On: 11/28/2023 16:55   Data Reviewed: Relevant notes from primary care and specialist visits, past discharge summaries as available in EHR, including Care Everywhere . Prior diagnostic testing as pertinent to current admission diagnoses, Updated medications and problem lists for reconciliation .ED course, including vitals, labs, imaging, treatment and response to treatment,Triage notes, nursing and pharmacy notes and ED provider's notes.Notable results as noted in HPI.Discussed case with EDMD/ ED APP/ or Specialty MD on call and as needed.  Assessment & Plan  >>Acute on chronic hypoxic respiratory failure: -Chest x-ray shows bilateral pleural  effusions.  Procalcitonin check, troponin, dimer, BNP. - Patient is currently stable back on his baseline 5 L of oxygen  via nasal cannula. -Will admit to med telemetry floor with continuous pulse oximetry. -Suspect patient has right-sided congestive heart failure. Discussed case with on-call cardiology fellow and will wait on results of troponin and BNP and CTA and pursue consult as deemed appropriate.  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 Cont steroid taper with solumedrol 40 mg q12h.  Cont IV abx and d/c if procalcitonin is negative.  Lasix  20 mg iv x single dose.  Strict I/O and daily weight.   >> CAD: - Stable patient does not report any chest pressure or tightness. Last heart cath in 2021: Prox LAD to Mid LAD lesion is 25% stenosed. The left ventricular systolic function is normal. LV end diastolic pressure is mildly elevated. The left ventricular ejection fraction is 55-65% by visual estimate. There is no aortic valve stenosis. Hemodynamic findings consistent with mild pulmonary hypertension. Ao sat 96%, PA sat 72%, mean PA pressure 31 mm Hg, mean PCWP 17 mm Hg; CO 6.0 L/min; CI 2.4 Noncardiac chest pain.  No significant CAD.  Mild pulmonary HTN.  Continue preventive therapy including treatment for sleep apnea and weight loss.      >> Paroxysmal A-fib: Pt does not know if he has a.fib.  States that he is on a blood thinner but does not know the name. -Currently in sinus rhythm. CHA2DS2/VAS Stroke Risk Points  Current as of 4 minutes ago     7 >= 2 Points: High Risk  1 to 1.99 Points: Medium Risk  0 Points: Low Risk    Last Change: N/A   Pt is not on any AC.  No cardiology notes in chart.  Previous echocardiogram as above, unable to assess diastolic function.   >> Depression/anxiety: Will continue patient on Lexapro  BuSpar  and as needed Ativan .     DVT prophylaxis:  Heparin .  Consults:  None Advance Care Planning:     Code Status: Full Code   Family Communication:  None Disposition Plan:  Home Severity of Illness: Observation.   * I certify that at the point of admission it is my clinical judgment that the patient will require inpatient hospital care spanning beyond 2 midnights from the point of admission due to high intensity of service, high risk for further deterioration and high frequency of surveillance required.*  Unresulted Labs (From admission, onward)     Start     Ordered   11/29/23 0500  Comprehensive metabolic panel  Tomorrow morning,   R        11/28/23 1802   11/29/23 0500  CBC  Tomorrow morning,   R        11/28/23 1802   11/28/23 1728  Procalcitonin  Once,   URGENT       References:    Procalcitonin Lower Respiratory Tract Infection AND Sepsis Procalcitonin Algorithm   11/28/23 1728            Orders Placed This Encounter  Procedures   Resp panel by RT-PCR (RSV, Flu A&B, Covid) Anterior Nasal Swab   DG Chest Port 1 View   CT Angio Chest Pulmonary Embolism (PE) W or WO Contrast   Comprehensive metabolic panel   CBC with Differential/Platelet   Brain natriuretic peptide   Procalcitonin   D-dimer, quantitative   Comprehensive metabolic panel   CBC   Diet Heart Room service appropriate? Yes; Fluid consistency: Thin   ED Cardiac monitoring   Initiate Carrier Fluid Protocol   Maintain IV access   Vital signs   Notify physician (specify)   Mobility Protocol: No Restrictions RN to initiate protocols based on patient's level of care   Refer to Sidebar Report Refer to ICU, Med-Surg, Progressive, and Step-Down Mobility Protocol Sidebars   Initiate Adult Central Line Maintenance and Catheter Protocol for patients with central line (CVC, PICC, Port, Hemodialysis, Trialysis)   Daily weights   Intake and Output   Do not place and if present remove PureWick   Initiate Oral Care Protocol   Initiate Carrier Fluid Protocol   RN may order General Admission PRN Orders utilizing  "General Admission PRN medications" (through manage orders) for the following patient needs: allergy symptoms (Claritin ), cold sores (Carmex), cough (Robitussin DM), eye irritation (Liquifilm Tears), hemorrhoids (Tucks), indigestion (Maalox), minor skin irritation (Hydrocortisone Cream), muscle pain Lovena Rubinstein Gay), nose irritation (saline nasal spray) and sore throat (Chloraseptic spray).   Cardiac Monitoring - Continuous Indefinite   Swallow screen   Full code   Consult to hospitalist   vancomycin  per pharmacy consult   ED Pulse oximetry, continuous   Bipap   Pulse oximetry check with vital signs   Oxygen  therapy Mode or (Route): Nasal cannula; Liters Per Minute: 2; Keep O2 saturation between: greater than 92 %   ED EKG   EKG 12-Lead   Saline lock IV   Place in observation (patient's expected length of stay will be less than 2 midnights)   Aspiration precautions   Fall precautions    Author: Lavanda Porter, MD 12 pm- 8 pm. [facility]. 11/28/2023 6:21 PM >>Please note for any concern,or critical results after hours past 8pm please contact the [facility] floor coverage provider from 7 PM- 7 AM. For on call review [email or web address], username [email or web address] and PW: your phone number<<

## 2023-11-28 NOTE — ED Notes (Signed)
 Dr. Allena Katz at bedside

## 2023-11-28 NOTE — ED Provider Notes (Signed)
  EMERGENCY DEPARTMENT AT [facility] Provider Note   CSN: [id number] Arrival date & time: 11/28/23  1551     History  Chief Complaint  Patient presents with   Respiratory Distress    Wayne Booth is a 83 y.o. male.  HPI Patient arrives via EMS in respiratory distress. He has a history of CHF, COPD, wears 5 L nasal cannula at baseline uses Lasix  as needed.  Today patient called due to worsening shortness of breath, was found to be hypoxic at baseline 91% in spite of typical oxygen , mild hypertension.  Patient received CPAP, nitroglycerin , had clinical improvement though he notes that he did not feel substantially different.  Patient denies pain, chest, abdomen.  Unclear weight loss.  Unclear weight gain.    Home Medications Prior to Admission medications   Medication Sig Start Date End Date Taking? Authorizing Provider  acetaminophen  (TYLENOL ) 325 MG tablet Take 2 tablets (650 mg total) by mouth every 6 (six) hours as needed for mild pain (pain score 1-3) (or Fever >/= 101). 09/17/23   Barbee Lew, MD  albuterol  (PROVENTIL ) (2.5 MG/3ML) 0.083% nebulizer solution Take 3 mLs (2.5 mg total) by nebulization every 6 (six) hours as needed for wheezing or shortness of breath. 10/12/22   Parrett, Macdonald Savoy, NP  albuterol  (VENTOLIN  HFA) 108 (90 Base) MCG/ACT inhaler INHALE 2 PUFFS BY MOUTH EVERY 6 HOURS AS NEEDED FOR WHEEZING OR SHORTNESS OF BREATH Patient taking differently: Inhale 2 puffs into the lungs every 6 (six) hours as needed for wheezing or shortness of breath. 07/13/23   Parrett, Macdonald Savoy, NP  budesonide  (PULMICORT ) 0.25 MG/2ML nebulizer solution Take 2 mLs (0.25 mg total) by nebulization 2 (two) times daily. Patient taking differently: Take 0.25 mg by nebulization in the morning and at bedtime. 09/07/23   Leona Rake, MD  escitalopram  (LEXAPRO ) 20 MG tablet TAKE 1 TABLET BY MOUTH DAILY 06/07/23   Almira Jaeger, MD  furosemide  (LASIX ) 20 MG tablet Take  1 tablet (20 mg total) by mouth daily. Patient taking differently: Take 20 mg by mouth daily as needed for fluid or edema. 09/08/23   Leona Rake, MD  gabapentin  (NEURONTIN ) 300 MG capsule Take 1 capsule (300 mg total) by mouth 3 (three) times daily. 12/25/22   Almira Jaeger, MD  ipratropium-albuterol  (DUONEB) 0.5-2.5 (3) MG/3ML SOLN Take 3 mLs by nebulization every 6 (six) hours as needed. Patient taking differently: Take 3 mLs by nebulization every 6 (six) hours as needed (wheezing or shortness of breath). 09/07/23   Leona Rake, MD  LORazepam  (ATIVAN ) 0.5 MG tablet Take 1 tablet (0.5 mg total) by mouth daily as needed for anxiety (do not take within 8 hours of bed. do not drive for 8 hours after taking.). 04/30/23   Almira Jaeger, MD  ondansetron  (ZOFRAN ) 4 MG tablet Take 1 tablet (4 mg total) by mouth every 6 (six) hours as needed for nausea. 09/17/23   Barbee Lew, MD  traMADol  (ULTRAM ) 50 MG tablet Take 1 tablet (50 mg total) by mouth every 6 (six) hours as needed (prefer max 3 per day). Patient taking differently: Take 50 mg by mouth every 6 (six) hours as needed for moderate pain (pain score 4-6) or severe pain (pain score 7-10) (prefer max 3 per day). 01/30/22   Rodney Clamp, MD  traZODone  (DESYREL ) 50 MG tablet TAKE 1/2 TO 1 TABLET BY MOUTH EVERY NIGHT AT BEDTIME AS NEEDED FOR SLEEP Patient taking differently: Take 50  mg by mouth at bedtime. 10/11/23   Christel Cousins, MD      Allergies    Ambien  [zolpidem ]    Review of Systems   Review of Systems  Physical Exam Updated Vital Signs BP 120/72 (BP Location: Right Arm)   Pulse 82   Temp 98.5 F (36.9 C) (Axillary)   Resp (!) 24   Ht 5\' 11"  (1.803 m)   Wt 101.6 kg   SpO2 93%   BMI 31.24 kg/m  Physical Exam Vitals and nursing note reviewed.  Constitutional:      General: He is in acute distress.     Appearance: He is well-developed. He is ill-appearing and diaphoretic.  HENT:     Head: Normocephalic and  atraumatic.  Eyes:     Conjunctiva/sclera: Conjunctivae normal.  Cardiovascular:     Rate and Rhythm: Regular rhythm. Tachycardia present.  Pulmonary:     Effort: Accessory muscle usage and respiratory distress present.     Breath sounds: Decreased air movement present. No stridor.  Abdominal:     General: There is no distension.  Skin:    General: Skin is warm.  Neurological:     Mental Status: He is alert and oriented to person, place, and time.     ED Results / Procedures / Treatments   Labs (all labs ordered are listed, but only abnormal results are displayed) Labs Reviewed  COMPREHENSIVE METABOLIC PANEL WITH GFR - Abnormal; Notable for the following components:      Result Value   Total Protein 8.2 (*)    Albumin  3.1 (*)    All other components within normal limits  RESP PANEL BY RT-PCR (RSV, FLU A&B, COVID)  RVPGX2  CBC WITH DIFFERENTIAL/PLATELET  BRAIN NATRIURETIC PEPTIDE    EKG None  Radiology DG Chest Port 1 View Result Date: 11/28/2023 CLINICAL DATA:  SOB EXAM: PORTABLE CHEST - 1 VIEW COMPARISON:  None available. FINDINGS: Low lung volumes. Unchanged small bilateral pleural effusions, slightly larger on the left than the right. Patchy airspace opacities in the right mid and lower lung zones. Minimal retrocardiac airspace opacities. Unchanged cardiomegaly. Tortuous aorta with aortic atherosclerosis. No acute fracture or destructive lesion. Multilevel degenerative disc disease of the spine. Cholecystectomy clips. IMPRESSION: Small bilateral pleural effusions, slightly larger on the left than the right. Patchy airspace opacities in the right mid lung and both lower lung zones, possibly atelectasis or bronchopneumonia in the correct clinical context. Electronically Signed   By: Rance Burrows M.D.   On: 11/28/2023 16:55    Procedures Procedures    Medications Ordered in ED Medications  vancomycin  (VANCOCIN ) IVPB 1000 mg/200 mL premix (has no administration in time  range)  ceFEPIme  (MAXIPIME ) 2 g in sodium chloride  0.9 % 100 mL IVPB (has no administration in time range)    ED Course/ Medical Decision Making/ A&P                                 Medical Decision Making Adult male with COPD, CHF presents in respiratory distress.  Concern for multifactorial etiology, as well as infection.  Patient's denial of chest pain, somewhat reassuring for low suspicion of ACS. Cardiac 75 sinus normal pulse ox 98% with BiPAP, abnormal  Amount and/or Complexity of Data Reviewed Independent Historian: EMS    Details: As above External Data Reviewed: notes.    Details: Hospitalization last month at affiliated facility Labs: ordered. Decision-making details documented  in ED Course. Radiology: ordered and independent interpretation performed. Decision-making details documented in ED Course. ECG/medicine tests: ordered and independent interpretation performed. Decision-making details documented in ED Course.  Risk Prescription drug management.   5:03 PM Patient noticed improve left, though continues to receive supplemental oxygen  via BiPAP.  X-ray concerning for multifocal pneumonia, and the patient was admitted for this last month. He continues to have increased oxygen  requirement, given his presentation for respiratory distress, receives antibiotics in addition to the previously provided Solu-Medrol , bronchodilators, some of which were provided by EMS.  BMP normal, patient has no lower extremity edema, less likely CHF with substantial contribution to his presentation.  With concern for oxygen  requirement, healthcare acquired pneumonia, respiratory distress patient will be admitted for further monitoring, management.    Final Clinical Impression(s) / ED Diagnoses Final diagnoses:  Respiratory distress  HCAP (healthcare-associated pneumonia)   CRITICAL CARE Performed by: Dorenda Gandy Total critical care time: 35 minutes Critical care time was exclusive of  separately billable procedures and treating other patients. Critical care was necessary to treat or prevent imminent or life-threatening deterioration. Critical care was time spent personally by me on the following activities: development of treatment plan with patient and/or surrogate as well as nursing, discussions with consultants, evaluation of patient's response to treatment, examination of patient, obtaining history from patient or surrogate, ordering and performing treatments and interventions, ordering and review of laboratory studies, ordering and review of radiographic studies, pulse oximetry and re-evaluation of patient's condition.    Dorenda Gandy, MD 11/28/23 1705

## 2023-11-29 ENCOUNTER — Ambulatory Visit: Admitting: Family Medicine

## 2023-11-29 DIAGNOSIS — Z6832 Body mass index (BMI) 32.0-32.9, adult: Secondary | ICD-10-CM | POA: Diagnosis not present

## 2023-11-29 DIAGNOSIS — E66811 Obesity, class 1: Secondary | ICD-10-CM | POA: Diagnosis present

## 2023-11-29 DIAGNOSIS — F32A Depression, unspecified: Secondary | ICD-10-CM | POA: Diagnosis present

## 2023-11-29 DIAGNOSIS — R0602 Shortness of breath: Secondary | ICD-10-CM | POA: Diagnosis not present

## 2023-11-29 DIAGNOSIS — E039 Hypothyroidism, unspecified: Secondary | ICD-10-CM | POA: Diagnosis not present

## 2023-11-29 DIAGNOSIS — J9611 Chronic respiratory failure with hypoxia: Secondary | ICD-10-CM | POA: Diagnosis not present

## 2023-11-29 DIAGNOSIS — Z7951 Long term (current) use of inhaled steroids: Secondary | ICD-10-CM | POA: Diagnosis not present

## 2023-11-29 DIAGNOSIS — Z905 Acquired absence of kidney: Secondary | ICD-10-CM | POA: Diagnosis not present

## 2023-11-29 DIAGNOSIS — Z79899 Other long term (current) drug therapy: Secondary | ICD-10-CM | POA: Diagnosis not present

## 2023-11-29 DIAGNOSIS — F419 Anxiety disorder, unspecified: Secondary | ICD-10-CM | POA: Diagnosis present

## 2023-11-29 DIAGNOSIS — J449 Chronic obstructive pulmonary disease, unspecified: Secondary | ICD-10-CM | POA: Diagnosis not present

## 2023-11-29 DIAGNOSIS — R0603 Acute respiratory distress: Secondary | ICD-10-CM | POA: Diagnosis present

## 2023-11-29 DIAGNOSIS — I2729 Other secondary pulmonary hypertension: Secondary | ICD-10-CM | POA: Diagnosis not present

## 2023-11-29 DIAGNOSIS — I110 Hypertensive heart disease with heart failure: Secondary | ICD-10-CM | POA: Diagnosis not present

## 2023-11-29 DIAGNOSIS — I5033 Acute on chronic diastolic (congestive) heart failure: Secondary | ICD-10-CM | POA: Diagnosis not present

## 2023-11-29 DIAGNOSIS — I480 Paroxysmal atrial fibrillation: Secondary | ICD-10-CM | POA: Diagnosis not present

## 2023-11-29 DIAGNOSIS — G4733 Obstructive sleep apnea (adult) (pediatric): Secondary | ICD-10-CM | POA: Diagnosis not present

## 2023-11-29 DIAGNOSIS — J440 Chronic obstructive pulmonary disease with acute lower respiratory infection: Secondary | ICD-10-CM | POA: Diagnosis not present

## 2023-11-29 DIAGNOSIS — J8410 Pulmonary fibrosis, unspecified: Secondary | ICD-10-CM | POA: Diagnosis not present

## 2023-11-29 DIAGNOSIS — I2510 Atherosclerotic heart disease of native coronary artery without angina pectoris: Secondary | ICD-10-CM | POA: Diagnosis not present

## 2023-11-29 DIAGNOSIS — Z1152 Encounter for screening for COVID-19: Secondary | ICD-10-CM | POA: Diagnosis not present

## 2023-11-29 DIAGNOSIS — U099 Post covid-19 condition, unspecified: Secondary | ICD-10-CM | POA: Diagnosis present

## 2023-11-29 DIAGNOSIS — Z96653 Presence of artificial knee joint, bilateral: Secondary | ICD-10-CM | POA: Diagnosis present

## 2023-11-29 LAB — CBC
HCT: 44.6 % (ref 39.0–52.0)
Hemoglobin: 14.9 g/dL (ref 13.0–17.0)
MCH: 29.9 pg (ref 26.0–34.0)
MCHC: 33.4 g/dL (ref 30.0–36.0)
MCV: 89.4 fL (ref 80.0–100.0)
Platelets: 331 10*3/uL (ref 150–400)
RBC: 4.99 MIL/uL (ref 4.22–5.81)
RDW: 13.9 % (ref 11.5–15.5)
WBC: 7.4 10*3/uL (ref 4.0–10.5)
nRBC: 0 % (ref 0.0–0.2)

## 2023-11-29 LAB — COMPREHENSIVE METABOLIC PANEL WITH GFR
ALT: 20 U/L (ref 0–44)
AST: 23 U/L (ref 15–41)
Albumin: 2.9 g/dL — ABNORMAL LOW (ref 3.5–5.0)
Alkaline Phosphatase: 64 U/L (ref 38–126)
Anion gap: 5 (ref 5–15)
BUN: 14 mg/dL (ref 8–23)
CO2: 25 mmol/L (ref 22–32)
Calcium: 8.4 mg/dL — ABNORMAL LOW (ref 8.9–10.3)
Chloride: 102 mmol/L (ref 98–111)
Creatinine, Ser: 1.12 mg/dL (ref 0.61–1.24)
GFR, Estimated: >60 mL/min (ref >60)
Glucose, Bld: 186 mg/dL — ABNORMAL HIGH (ref 70–99)
Potassium: 4.3 mmol/L (ref 3.5–5.1)
Sodium: 132 mmol/L — ABNORMAL LOW (ref 135–145)
Total Bilirubin: 0.7 mg/dL (ref 0.0–1.2)
Total Protein: 8.3 g/dL — ABNORMAL HIGH (ref 6.5–8.1)

## 2023-11-29 MED ORDER — FUROSEMIDE 10 MG/ML IJ SOLN
40.0000 mg | Freq: Every day | INTRAMUSCULAR | Status: DC
  Administered 2023-11-29: 40 mg via INTRAVENOUS
  Filled 2023-11-29 (×2): qty 4

## 2023-11-29 MED ORDER — LORAZEPAM 0.5 MG PO TABS
0.5000 mg | ORAL_TABLET | Freq: Every day | ORAL | Status: DC | PRN
  Administered 2023-11-29 – 2023-12-02 (×4): 0.5 mg via ORAL
  Filled 2023-11-29 (×3): qty 1

## 2023-11-29 NOTE — Progress Notes (Signed)
  Progress Note   Patient: Wayne Booth WUJ:[id number] DOB: January 09, 1941 DOA: 11/28/2023     0 DOS: the patient was seen and examined on 11/29/2023 at 8:01AM      Brief hospital course: 83 y.o. M with COVID fibrosis, dCHF not on diruetic, Afib not on AC, COPD and chronic respiratory failure who presented with shortness of breath progressive for a few days.  Similar presentations in Mar and April.  No PCP or Pulm follow ups after either of those hospitalizations.    Here, Chest imaging showed bilateral infiltrates and small effusions.    Assessment and Plan: Acute on chronic diastolic congestive heart failure EF 65 to 70% on echo in March, diastolic function not evaluated.  Admitted in April with identical symptoms, treated with IV Lasix , improved and was discharged home.  Idiopathic pulmonary fibrosis ruled out, right heart failure ruled out.  He may have some mild diastolic congestive heart failure.  However I would note that he was on 5 L when I walked in the room at 100%, I weaned him down to room air and he remained in the mid 90s.  -Continue IV Lasix  - Stop steroids - Stop antibiotics  Chronic respiratory failure COVID-related lung fibrosis Reports that he has oxygen  at home, uses this when he is in the house walking around, but not if he is sitting down, not if he leaves the house to go to the store.  When ambulating with PT today on room air, he desaturated to 80%, took 3 minutes to return to normal.  Paroxysmal atrial fibrillation, not on anticoagulation In sinus rhythm here  COPD No wheezing  Class 1 obesity BMI 32, complicates care          Subjective: Tired.  Desatted with ambulation with PT.  No clinical change from ewhen he came in, still SOB.     Physical Exam: BP 129/78   Pulse 92   Temp 97.6 F (36.4 C) (Oral)   Resp (!) 23   Ht 5\' 11"  (1.803 m)   Wt 101.6 kg   SpO2 98%   BMI 31.24 kg/m   Adult male, sitting up in chair, eating  breakfast RRR, no murmurs, no pitting in the lower extremities, abdomen is rotund could be hiding a lot of fluid, neck veins are not visible under adipose tissue Respiratory rate normal, lungs diminished, rales in the bilateral bases Abdomen distended, no tenderness palpation Attention normal, affect normal, judgment and insight appear normal    Data Reviewed: Basic metabolic panel shows stable renal function, normal potassium, normal LFTs Troponin, BNP normal Procalcitonin negative CBC normal  Family Communication:     Disposition: Status is: Observation         Author: Ephriam Hashimoto, MD 11/29/2023 4:20 PM  For on call review [email or web address].

## 2023-11-29 NOTE — Care Management Obs Status (Signed)
 MEDICARE OBSERVATION STATUS NOTIFICATION   Patient Details  Name: Wayne Booth MRN: [id number] Date of Birth: 11/22/1940   Medicare Observation Status Notification Given:  Yes    Joanette Moynahan, RN 11/29/2023, 2:18 PM

## 2023-11-29 NOTE — Progress Notes (Signed)
 Transition of Care [facility]) - Inpatient Brief Assessment   Patient Details  Name: Wayne Booth MRN: [id number] Date of Birth: Oct 24, 1940  [facility]) CM/SW Contact:    Joanette Moynahan, RN Phone Number: 11/29/2023, 2:20 PM   Clinical Narrative:  TOC following for discharge needs.  Has access to rolling walker, cane and wheelchair. Loreal Schuessler J. Rachel Budds, BSN,  RN, [place] [phone]   Transition of Care Asessment: Insurance and Status: Insurance coverage has been reviewed Patient has primary care physician: Yes Home environment has been reviewed: from home with spouse Prior level of function:: (P) A little help with walking and/or transfers;A little help with bathing/dressing/bathroom;Assistance with cooking/housework;Assist for transportation Prior/Current Home Services: (P) Current home services Producer, television/film/video for PT/OT/Nursing Aide) Social Drivers of Health Review: (P) SDOH reviewed no interventions necessary Readmission risk has been reviewed: (P) Yes Transition of care needs: (P) transition of care needs identified, TOC will continue to follow

## 2023-11-29 NOTE — Progress Notes (Addendum)
  1945: This RN was notified that patient called and said he needs to go to the bathroom. This RN walked into the room and asked pt if he needs to use the rest room and pt said "I want to try".  This RN asked if he feels that he is about to have BM. Pt said " That's why I want to try". This RN walked to get pair of gloves and asked the patient if he uses walker or cane. Pt said "no". This RN said "ok, let's get up". Pt got agitated and said " are you going to help me? You know what! get your a** out of here and get me someone else". This RN Physiological scientist Zoila Hines.

## 2023-11-29 NOTE — ED Notes (Signed)
 Pt making derogatory and sexual remarks to this nurse and staff.

## 2023-11-29 NOTE — ED Notes (Signed)
 Pt placed on room air. Spo2 remains above 94

## 2023-11-29 NOTE — ED Notes (Signed)
Pt up to chair x1 assist.

## 2023-11-29 NOTE — Evaluation (Signed)
 Physical Therapy Evaluation Patient Details Name: Wayne Booth MRN: [id number] DOB: 1941/02/28 Today's Date: 11/29/2023  History of Present Illness  Pt is an 83 y/o M admitted on 11/28/23 after presenting with c/o worsening SOB. Pt is being treated for acute on chronic hypoxic respiratory failure. Pt noted to have bilateral pleural effusions. 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 Pt reports prior to admission he was independent without AD, denies falls. On this date, pt received on room air, SpO2 >/= 90%. Pt ambulates to door & back without AD with CGA, returns to sitting & request supplemental O2, SpO2 89-90% - pt placed on 2L/min. Pt then ambulate to ambulate ~45 ft without AD with CGA, SPO2 drops as low as 87% after gait but increases to >/=90% with seated rest. Pt left on 2L/min to recover. PT educates pt on recommendation to use RW at d/c for increased stability with gait. Pt would benefit from ongoing PT services to progress mobility as able.        If plan is discharge home, recommend the following: A little help with walking and/or transfers;A little help with bathing/dressing/bathroom;Assistance with cooking/housework;Assist for transportation;Help with stairs or ramp for entrance   Can travel by private vehicle        Equipment Recommendations None recommended by PT  Recommendations for Other Services       Functional Status Assessment Patient has had a recent decline in their functional status and demonstrates the ability to make significant improvements in function in a reasonable and predictable amount of time.     Precautions / Restrictions Precautions Precautions: Fall Restrictions Weight Bearing Restrictions Per Provider Order: No      Mobility  Bed Mobility               General bed mobility comments:  not tested, pt received & left sitting in recliner    Transfers Overall transfer level: Needs assistance   Transfers: Sit to/from Stand Sit to Stand: Supervision                Ambulation/Gait Ambulation/Gait assistance: Contact guard assist Gait Distance (Feet): 5 Feet (+ 45 ft) Assistive device: None Gait Pattern/deviations: Decreased step length - left, Decreased stride length, Decreased step length - right Gait velocity: decreased     General Gait Details: no overt LOB  Stairs            Wheelchair Mobility     Tilt Bed    Modified Rankin (Stroke Patients Only)       Balance Overall balance assessment: Needs assistance Sitting-balance support: Feet supported Sitting balance-Leahy Scale: Good     Standing balance support: During functional activity, No upper extremity supported Standing balance-Leahy Scale: Fair                               Pertinent Vitals/Pain Pain Assessment Pain Assessment: No/denies pain    Home Living Family/patient expects to be discharged to:: Private residence Living Arrangements: Spouse/significant other Available Help at Discharge: Family Type of Home: House Home Access: Level entry     Alternate Level Stairs-Number of Steps: 12 Home Layout: Multi-level;Able to live on main level with bedroom/bathroom Home Equipment: Shower seat - built Charity fundraiser (2 wheels);Cane - single point Additional Comments: ?intermittent O2 use  Prior Function Prior Level of Function : Independent/Modified Independent             Mobility Comments: Independent without AD, denies falls       Extremity/Trunk Assessment   Upper Extremity Assessment Upper Extremity Assessment: Overall WFL for tasks assessed    Lower Extremity Assessment Lower Extremity Assessment: Generalized weakness    Cervical / Trunk Assessment Cervical / Trunk Assessment: Kyphotic  Communication   Communication Communication:  No apparent difficulties    Cognition Arousal: Alert Behavior During Therapy: WFL for tasks assessed/performed   PT - Cognitive impairments: No apparent impairments                       PT - Cognition Comments: does make inappropriate comment (asking PT if she will help him in the shower) Following commands: Intact       Cueing Cueing Techniques: Verbal cues     General Comments      Exercises     Assessment/Plan    PT Assessment Patient needs continued PT services  PT Problem List Decreased strength;Cardiopulmonary status limiting activity;Decreased activity tolerance;Decreased balance;Decreased mobility;Decreased knowledge of use of DME       PT Treatment Interventions DME instruction;Balance training;Neuromuscular re-education;Gait training;Stair training;Functional mobility training;Therapeutic activities;Therapeutic exercise;Patient/family education    PT Goals (Current goals can be found in the Care Plan section)  Acute Rehab PT Goals Patient Stated Goal: get better PT Goal Formulation: With patient Time For Goal Achievement: 12/13/23 Potential to Achieve Goals: Good    Frequency Min 2X/week     Co-evaluation               AM-PAC PT "6 Clicks" Mobility  Outcome Measure Help needed turning from your back to your side while in a flat bed without using bedrails?: None Help needed moving from lying on your back to sitting on the side of a flat bed without using bedrails?: A Little Help needed moving to and from a bed to a chair (including a wheelchair)?: A Little Help needed standing up from a chair using your arms (e.g., wheelchair or bedside chair)?: A Little Help needed to walk in hospital room?: A Little Help needed climbing 3-5 steps with a railing? : A Lot 6 Click Score: 18    End of Session Equipment Utilized During Treatment: Oxygen  Activity Tolerance: Patient tolerated treatment well Patient left: in chair;with call bell/phone within  reach Nurse Communication: Mobility status (O2) PT Visit Diagnosis: Unsteadiness on feet (R26.81);Muscle weakness (generalized) (M62.81)    Time: 1610-9604 PT Time Calculation (min) (ACUTE ONLY): 15 min   Charges:   PT Evaluation $PT Eval Low Complexity: 1 Low   PT General Charges $$ ACUTE PT VISIT: 1 Visit         Emaline Handsome, PT, DPT 11/29/23, 1:02 PM   Venetta Gill 11/29/2023, 1:00 PM

## 2023-11-30 ENCOUNTER — Other Ambulatory Visit (HOSPITAL_COMMUNITY): Payer: Self-pay

## 2023-11-30 DIAGNOSIS — I5033 Acute on chronic diastolic (congestive) heart failure: Secondary | ICD-10-CM

## 2023-11-30 LAB — BASIC METABOLIC PANEL WITH GFR
Anion gap: 7 (ref 5–15)
BUN: 24 mg/dL — ABNORMAL HIGH (ref 8–23)
CO2: 28 mmol/L (ref 22–32)
Calcium: 8.7 mg/dL — ABNORMAL LOW (ref 8.9–10.3)
Chloride: 101 mmol/L (ref 98–111)
Creatinine, Ser: 1.28 mg/dL — ABNORMAL HIGH (ref 0.61–1.24)
GFR, Estimated: 56 mL/min — ABNORMAL LOW (ref >60)
Glucose, Bld: 117 mg/dL — ABNORMAL HIGH (ref 70–99)
Potassium: 4 mmol/L (ref 3.5–5.1)
Sodium: 136 mmol/L (ref 135–145)

## 2023-11-30 LAB — CBC
HCT: 42.4 % (ref 39.0–52.0)
Hemoglobin: 14.2 g/dL (ref 13.0–17.0)
MCH: 29.8 pg (ref 26.0–34.0)
MCHC: 33.5 g/dL (ref 30.0–36.0)
MCV: 88.9 fL (ref 80.0–100.0)
Platelets: 348 10*3/uL (ref 150–400)
RBC: 4.77 MIL/uL (ref 4.22–5.81)
RDW: 14 % (ref 11.5–15.5)
WBC: 15.6 10*3/uL — ABNORMAL HIGH (ref 4.0–10.5)
nRBC: 0 % (ref 0.0–0.2)

## 2023-11-30 LAB — MAGNESIUM: Magnesium: 2.1 mg/dL (ref 1.7–2.4)

## 2023-11-30 MED ORDER — FUROSEMIDE 10 MG/ML IJ SOLN
40.0000 mg | Freq: Two times a day (BID) | INTRAMUSCULAR | Status: DC
  Administered 2023-11-30 – 2023-12-04 (×8): 40 mg via INTRAVENOUS
  Filled 2023-11-30 (×8): qty 4

## 2023-11-30 MED ORDER — FUROSEMIDE 40 MG PO TABS: 40.0000 mg | ORAL_TABLET | Freq: Every day | ORAL | Status: DC

## 2023-11-30 MED ORDER — METOPROLOL TARTRATE 5 MG/5ML IV SOLN: 5.0000 mg | INTRAVENOUS | Status: DC | PRN

## 2023-11-30 MED ORDER — FUROSEMIDE 20 MG PO TABS
40.0000 mg | ORAL_TABLET | Freq: Every day | ORAL | 0 refills | Status: DC
Start: 2023-11-30 — End: 2023-12-04
  Filled 2023-11-30: qty 60, 30d supply, fill #0

## 2023-11-30 NOTE — TOC Progression Note (Addendum)
 Transition of Care [facility]) - Progression Note    Patient Details  Name: Wayne Booth MRN: [id number] Date of Birth: 1941/02/06  [facility] CM/SW Contact  Omie Bickers, RN Phone Number: 11/30/2023, 12:53 PM  Clinical Narrative:     Notified by MD that patient would like to appeal his DC. Spoke w patient at bedside and confirmed. Reviewed process with patient and assisted him in calling [facility].  Reviewed IM, DND, and HINN 12 with patient.  Case ID [id number]       Expected Discharge Plan and Services         Expected Discharge Date: 11/30/23                                     Social Determinants of Health (SDOH) Interventions SDOH Screenings   Food Insecurity: No Food Insecurity (11/29/2023)  Housing: Low Risk  (11/29/2023)  Transportation Needs: No Transportation Needs (11/29/2023)  Utilities: Not At Risk (11/29/2023)  Depression (PHQ2-9): High Risk (02/11/2023)  Financial Resource Strain: Low Risk  (01/04/2023)  Physical Activity: Inactive (01/04/2023)  Social Connections: Moderately Integrated (11/29/2023)  Stress: No Stress Concern Present (01/04/2023)  Tobacco Use: Low Risk  (11/28/2023)    Readmission Risk Interventions    10/20/2023   11:54 AM  Readmission Risk Prevention Plan  Transportation Screening Complete  PCP or Specialist Appt within 5-7 Days Complete  Home Care Screening Complete  Medication Review (RN CM) Complete

## 2023-11-30 NOTE — Progress Notes (Addendum)
  Progress Note   Patient: Wayne Booth ZOX:[id number] DOB: 08/26/1940 DOA: 11/28/2023     1 DOS: the patient was seen and examined on 11/30/2023 at 8:27AM      Brief hospital course: 83 y.o. M with COVID pulmonary fibrosis, dCHF not on diuretic, Afib not on AC, COPD, OSA nonadherent to CPAP, and chronic respiratory failure nonadherent to home O2 presented with progressive SOB.  Similar presentations in Mar and April. No PCP or pulm follow ups after either of those hospitalizations.    Here, chest imaging showed bilateral infiltrates and small effusions.  Admitted on diuretics for CHF flare due to nonadherence.     Assessment and Plan: Acute on chronic diastolic congestive heart failure EF 65 to 70% on echo in March, diastolic dysfunction not visible.  Admitted in April again with identical symptoms, pulmonology consulted, treated with IV Lasix , improved and was discharged home.  Idiopathic pulmonary fibrosis ruled out, right heart failure ruled out.  He may have some mild diastolic congestive heart failure. - Continue IV Lasix  - Potassium supplement - Daily BMP -Strict ins and outs, daily weights  Chronic respiratory failure with hypoxia COVID-related lung fibrosis Reports that he has oxygen  at home, uses this when he is in the house walking around but not if he is sitting down, and not if he leaves the house.  Of note, at rest in the hospital even on admission, he was able to saturate mid 90s on room air, and only desaturated with ambulation.  There were conflicting reports yesterday about whether he desaturated with his home oxygen  (I received 1 report that he maintained his sats on his home oxygen  5 L, and another report that he desaturated to 87% with 2 L), but today he desaturated to 87% even with 6 L. - Continue diuresis as above  Paroxysmal atrial fibrillation, not on anticoagulation In sinus rhythm here, brief episodes of A-fib overnight.  Has refused anticoagulation in  the past.  COPD No wheezing  Class I obesity BMI 32, complicates care  OSA Has declined CPAP in the past.       Subjective: Still feels tired and out of breath.  No swelling.  No confusion, no fever, no cough, no sputum.     Physical Exam: BP 124/84 (BP Location: Right Arm)   Pulse 68   Temp 98.2 F (36.8 C) (Oral)   Resp (!) 21   Ht 5\' 11"  (1.803 m)   Wt 101.6 kg   SpO2 99%   BMI 31.24 kg/m   Adult male, sitting up in bed, interactive and appropriate RRR, no murmurs, no peripheral edema Respiratory rate normal, lungs clear without rales or wheezes Abdomen soft without tenderness palpation or guarding, it is rotund and may be hiding some fluid Attention normal, affect normal, judgment and insight appear normal, generalized weakness but symmetric strength    Data Reviewed: Basic metabolic panel shows creatinine up to 1.2 CBC shows white count elevated due to steroids yesterday  Family Communication: None present    Disposition: Status is: Inpatient 83 year old man noncompliant with diuretics, CPAP, home oxygen  who presents with shortness of breath with exertion, CHF flare.  Given still hypoxic to 87% on 6L today, will continue IV diureiss and closely monitor renal function.          Author: Ephriam Hashimoto, MD 11/30/2023 2:25 PM  For on call review [email or web address].

## 2023-11-30 NOTE — Progress Notes (Addendum)
 1100 patient appealing discharge   DISCHARGE NOTE HOME JUNG YURCHAK to be discharged Home per MD order. Discussed prescriptions and follow up appointments with the patient. Prescriptions given to patient; medication list explained in detail. Patient verbalized understanding.  Skin clean, dry and intact without evidence of skin break down, no evidence of skin tears noted. IV catheter discontinued intact. Site without signs and symptoms of complications. Dressing and pressure applied. Pt denies pain at the site currently. No complaints noted.  Patient free of lines, drains, and wounds.   An After Visit Summary (AVS) was printed and given to the patient.  Taking to Discharge Lounge to wait for ride Patient escorted via wheelchair, and discharged home via private auto.  Tonda Francisco, RN

## 2023-11-30 NOTE — TOC Transition Note (Signed)
 Transition of Care [facility]) - Discharge Note   Patient Details  Name: Wayne Booth MRN: [id number] Date of Birth: 03-20-1941  Transition of Care [facility]) CM/SW Contact:  Omie Bickers, RN Phone Number: 11/30/2023, 9:45 AM   Clinical Narrative:      [facility] liaison, patient will continue services and will also have [facility] RN.        Patient Goals and CMS Choice            Discharge Placement                       Discharge Plan and Services Additional resources added to the After Visit Summary for                                       Social Drivers of Health (SDOH) Interventions SDOH Screenings   Food Insecurity: No Food Insecurity (11/29/2023)  Housing: Low Risk  (11/29/2023)  Transportation Needs: No Transportation Needs (11/29/2023)  Utilities: Not At Risk (11/29/2023)  Depression (PHQ2-9): High Risk (02/11/2023)  Financial Resource Strain: Low Risk  (01/04/2023)  Physical Activity: Inactive (01/04/2023)  Social Connections: Moderately Integrated (11/29/2023)  Stress: No Stress Concern Present (01/04/2023)  Tobacco Use: Low Risk  (11/28/2023)     Readmission Risk Interventions    10/20/2023   11:54 AM  Readmission Risk Prevention Plan  Transportation Screening Complete  PCP or Specialist Appt within 5-7 Days Complete  Home Care Screening Complete  Medication Review (RN CM) Complete

## 2023-11-30 NOTE — Progress Notes (Signed)
 TRH night cross cover note:   I was notified by the patient's RN of irregular appearing rhythm on monitor with ensuing EKG appearing to suggest SR with 1st degree av block and frequent pac's, with HR in the 80's. Patient without any acute symptoms.  Vital signs otherwise appear stable, including afebrile, systolic blood pressures in the 120s mmHg, RR 15-20, O2 sats in mid 90's on 5 L. Will continue to monitor. Bmp, cbc ordered for the AM. I've added a mag level to the AM labs.     Camelia Cavalier, DO Hospitalist

## 2023-11-30 NOTE — Progress Notes (Signed)
 Mobility Specialist Progress Note:   11/30/23 1202  Mobility  Activity Ambulated with assistance in hallway  Level of Assistance Contact guard assist, steadying assist  Assistive Device None  Distance Ambulated (ft) 100 ft  Activity Response Tolerated well  Mobility Referral Yes  Mobility visit 1 Mobility  Mobility Specialist Start Time (ACUTE ONLY) 1202  Mobility Specialist Stop Time (ACUTE ONLY) 1220  Mobility Specialist Time Calculation (min) (ACUTE ONLY) 18 min   Pt agreeable to mobility session. Required no physical assistance, only minG for safety. Pt c/o SOB with incr distance, SpO2 89% on 6LO2 (5L at baseline). Pt left sitting in chair with all needs met.   Oneda Big Mobility Specialist Please contact via SecureChat or  Rehab office at [phone]

## 2023-11-30 NOTE — Progress Notes (Addendum)
 Received call at nurse's station of request to speak to charge RN. This RN responded to said request and inquired as to the nature of his concern. Patient expressed that he "just wanted some help". Inquired as to why he ejected primary RN who had responded to previous call for help. Patient stated "I'm not taking any attitude from him, or you". With verbal de-escalation having failed, this RN notified the [facility] of refusal of care.  Accompanied primary RN to bedside to attempt medication pass; patient expresses gratitude with presence of this RN saying "I was hoping I'd get a chance to apologize" and expressed a desire to "start over". Primary RN then assessed and obtained vital signs in the presence of this RN.

## 2023-11-30 NOTE — Progress Notes (Signed)
   11/30/23 1350  [facility] [facility])  [facility] ROI Document Created Yes  [facility]) appeal documents uploaded to [facility]) site Yes   Uploaded to website, Confirmation # [phone]

## 2023-11-30 NOTE — Care Management Important Message (Signed)
 Important Message  Patient Details  Name: QUINTEZ MASELLI MRN: [id number] Date of Birth: [birth year]   Important Message Given:  Yes - Medicare IM     Omie Bickers, RN 11/30/2023, 12:12 PM

## 2023-12-01 ENCOUNTER — Telehealth: Payer: Self-pay | Admitting: Family Medicine

## 2023-12-01 DIAGNOSIS — J8410 Pulmonary fibrosis, unspecified: Secondary | ICD-10-CM

## 2023-12-01 DIAGNOSIS — E66811 Obesity, class 1: Secondary | ICD-10-CM

## 2023-12-01 DIAGNOSIS — I5033 Acute on chronic diastolic (congestive) heart failure: Secondary | ICD-10-CM | POA: Diagnosis not present

## 2023-12-01 DIAGNOSIS — G4733 Obstructive sleep apnea (adult) (pediatric): Secondary | ICD-10-CM | POA: Diagnosis not present

## 2023-12-01 DIAGNOSIS — J9611 Chronic respiratory failure with hypoxia: Secondary | ICD-10-CM

## 2023-12-01 LAB — BASIC METABOLIC PANEL WITH GFR
Anion gap: 10 (ref 5–15)
BUN: 25 mg/dL — ABNORMAL HIGH (ref 8–23)
CO2: 29 mmol/L (ref 22–32)
Calcium: 8.6 mg/dL — ABNORMAL LOW (ref 8.9–10.3)
Chloride: 95 mmol/L — ABNORMAL LOW (ref 98–111)
Creatinine, Ser: 1.14 mg/dL (ref 0.61–1.24)
GFR, Estimated: >60 mL/min (ref >60)
Glucose, Bld: 160 mg/dL — ABNORMAL HIGH (ref 70–99)
Potassium: 3.8 mmol/L (ref 3.5–5.1)
Sodium: 134 mmol/L — ABNORMAL LOW (ref 135–145)

## 2023-12-01 LAB — CBC
HCT: 44.6 % (ref 39.0–52.0)
Hemoglobin: 14.7 g/dL (ref 13.0–17.0)
MCH: 29.6 pg (ref 26.0–34.0)
MCHC: 33 g/dL (ref 30.0–36.0)
MCV: 89.9 fL (ref 80.0–100.0)
Platelets: 345 10*3/uL (ref 150–400)
RBC: 4.96 MIL/uL (ref 4.22–5.81)
RDW: 14.4 % (ref 11.5–15.5)
WBC: 8.3 10*3/uL (ref 4.0–10.5)
nRBC: 0 % (ref 0.0–0.2)

## 2023-12-01 NOTE — TOC Progression Note (Signed)
 Transition of Care [facility]) - Progression Note    Patient Details  Name: Wayne Booth MRN: [id number] Date of Birth: 28-Jun-1941  Transition of [facility]) CM/SW Contact  Arron Big, [place] Phone Number: 12/01/2023, 2:46 PM  Clinical Narrative:   PT updated rec to SNF. CSW spoke with patient at bedside to discuss update in PT rec. Patient would like go to [facility]. CSW explained to patient that since he is progressing in his ability to walk and has no other skillable need insurance may not cover SNF stay and he would have to private pay if he still would like to go. Patient expressed his understanding verbally and stated he will not private pay for SNF. Patient would like CSW to try for SNF and insurance auth anyway. Patient stated he would like to go to [facility].   TOC will continue to follow.    Expected Discharge Plan and Services         Expected Discharge Date: 11/30/23                                     Social Determinants of Health (SDOH) Interventions SDOH Screenings   Food Insecurity: No Food Insecurity (11/29/2023)  Housing: Low Risk  (11/29/2023)  Transportation Needs: No Transportation Needs (11/29/2023)  Utilities: Not At Risk (11/29/2023)  Depression (PHQ2-9): High Risk (02/11/2023)  Financial Resource Strain: Low Risk  (01/04/2023)  Physical Activity: Inactive (01/04/2023)  Social Connections: Moderately Integrated (11/29/2023)  Stress: No Stress Concern Present (01/04/2023)  Tobacco Use: Low Risk  (11/28/2023)    Readmission Risk Interventions    10/20/2023   11:54 AM  Readmission Risk Prevention Plan  Transportation Screening Complete  PCP or Specialist Appt within 5-7 Days Complete  Home Care Screening Complete  Medication Review (RN CM) Complete

## 2023-12-01 NOTE — Care Management (Addendum)
 13:05 update from Appeal website: Overall Status Appeal Case: is in Clinical Review.  1. Appeal Started 11/30/2023 12:00:42  2. Medical Record Requested 11/30/2023 12:43:45  3. Documents Received 11/30/2023 14:40:54  4. In Clinical Review 11/30/2023 16:55:21  13:25 Received from from [facility] appeal reviewer that they have agreed with patient on appeal, that patient was written for DC too soon.  Notified patient, attending and social worker who is working on SNF placement

## 2023-12-01 NOTE — NC FL2 (Signed)
 [place]  MEDICAID FL2 LEVEL OF CARE FORM     IDENTIFICATION  Patient Name: Wayne Booth Birthdate: [birth year] Sex: male Admission Date (Current Location): 11/28/2023  [facility] and [place] Number:  Producer, television/film/video and Address:  [facility] [place], [address], [place], [place] [address]      Provider Number: [id number]  Attending Physician Name and Address:  Aisha Hove, MD  Relative Name and Phone Number:       Current Level of Care: Hospital Recommended Level of Care: Skilled Nursing Facility Prior Approval Number:    Date Approved/Denied:   PASRR Number: [id number]  Discharge Plan: SNF    Current Diagnoses: Patient Active Problem List   Diagnosis Date Noted   Acute on chronic diastolic CHF (congestive heart failure) (HCC) 11/29/2023   Class 1 obesity 11/28/2023   Acute on chronic hypoxic respiratory failure (HCC) 09/13/2023   Lobar pneumonia (HCC) 08/29/2023   Severe sepsis (HCC) 08/29/2023   COPD (chronic obstructive pulmonary disease) (HCC) 08/29/2023   Palliative care patient 10/26/2022   Conjunctivitis 10/12/2022   Physical deconditioning 07/17/2022   Pharmacologic therapy 01/26/2021   Disorder of skeletal system 01/26/2021   Problems influencing health status 01/26/2021   ILD (interstitial lung disease) (HCC) 10/13/2019   Nodule of right lung 10/13/2019   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   PTSD (post-traumatic stress disorder) 07/27/2018   Memory loss 06/10/2018   Aortic atherosclerosis (HCC) 05/10/2018   Chronic respiratory failure with hypoxia (HCC) 04/26/2018   First degree AV block 04/15/2018   Hyponatremia 04/15/2018   Peripheral neuropathy 04/14/2018   CAD (coronary artery disease) 04/09/2018   Chronic pain syndrome 04/09/2018   CKD (chronic kidney disease) stage 3, GFR 30-59 ml/min (HCC)    Dyspnea on exertion    HCAP (healthcare-associated pneumonia) 02/03/2018   Constipation 01/21/2018    (HFpEF) heart failure with preserved ejection fraction (HCC) 12/16/2017   Chest pain    Anticoagulated    Atrial flutter (HCC) 11/01/2017   Pain in left knee 07/15/2017   Bilateral foot pain 07/13/2017   Community acquired pneumonia 06/18/2017   Status post nephrectomy 06/09/2017   VTE (venous thromboembolism) 06/09/2017   History of adenomatous polyp of colon 04/12/2017   Chronic pulmonary embolism (HCC) 10/23/2016   DDD (degenerative disc disease), cervical 11/13/2015   Essential tremor 08/14/2015   Gout 08/14/2015   History of fusion of cervical spine 04/24/2015   Hypothyroidism 07/12/2014   Hyperglycemia 07/12/2014   GAD (generalized anxiety disorder) 11/07/2013   OSA (obstructive sleep apnea) 07/07/2011   Asthmatic bronchitis 06/09/2011   Actinic keratosis 11/27/2009   History of total knee replacement, 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  Dr. Rozanne Corners 10/02/2008   History of renal cell carcinoma 12/26/2007   Insomnia 12/26/2007   TESTOSTERONE  DEFICIENCY 12/28/2006   Essential hypertension 12/28/2006   History of total knee replacement, left 10/28/2006    Orientation RESPIRATION BLADDER Height & Weight     Self, Time, Situation, Place  O2 (5L O2) Continent Weight: 232 lb 2.3 oz (105.3 kg) Height:  5\' 11"  (180.3 cm)  BEHAVIORAL SYMPTOMS/MOOD NEUROLOGICAL BOWEL NUTRITION STATUS      Continent Diet (see dc summary)  AMBULATORY STATUS COMMUNICATION OF NEEDS Skin   Supervision Verbally Normal                       Personal Care Assistance Level of Assistance  Bathing, Feeding,  Dressing Bathing Assistance:  (see dc summary) Feeding assistance:  (see dc summary) Dressing Assistance:  (see dc summary)     Functional Limitations Info  Sight, Hearing, Speech Sight Info: Adequate Hearing Info: Adequate Speech Info: Adequate    SPECIAL CARE FACTORS FREQUENCY  PT (By licensed PT), OT (By licensed OT)      PT Frequency: 5x week OT Frequency: 5x week            Contractures Contractures Info: Not present    Additional Factors Info  Code Status, Allergies, Psychotropic Code Status Info: Full Allergies Info: Ambien  (zolpidem ) Psychotropic Info: LEXAPRO , NEURONTIN , ATIVAN          Current Medications (12/01/2023):  This is the current hospital active medication list Current Facility-Administered Medications  Medication Dose Route Frequency Provider Last Rate Last Admin   acetaminophen  (TYLENOL ) tablet 650 mg  650 mg Oral Q6H PRN Patel, Ekta V, MD       Or   acetaminophen  (TYLENOL ) suppository 650 mg  650 mg Rectal Q6H PRN Patel, Ekta V, MD       escitalopram  (LEXAPRO ) tablet 20 mg  20 mg Oral Daily Patel, Ekta V, MD   20 mg at 12/01/23 0981   furosemide  (LASIX ) injection 40 mg  40 mg Intravenous BID Ephriam Hashimoto, MD   40 mg at 12/01/23 0912   gabapentin  (NEURONTIN ) capsule 300 mg  300 mg Oral TID Patel, Ekta V, MD   300 mg at 12/01/23 1914   heparin  injection 5,000 Units  5,000 Units Subcutaneous Q8H Lavanda Porter, MD   5,000 Units at 12/01/23 1444   LORazepam  (ATIVAN ) tablet 0.5 mg  0.5 mg Oral Daily PRN Howerter, Justin B, DO   0.5 mg at 12/01/23 1350   metoprolol  tartrate (LOPRESSOR ) injection 5 mg  5 mg Intravenous Q4H PRN Howerter, Justin B, DO       sodium chloride  flush (NS) 0.9 % injection 3 mL  3 mL Intravenous Q12H Brunilda Capra V, MD   3 mL at 11/30/23 2128   sodium chloride  flush (NS) 0.9 % injection 3-10 mL  3-10 mL Intravenous Q12H Brunilda Capra V, MD   10 mL at 11/30/23 2129   sodium chloride  flush (NS) 0.9 % injection 3-10 mL  3-10 mL Intravenous PRN Lavanda Porter, MD       traZODone  (DESYREL ) tablet 50 mg  50 mg Oral QHS Patel, Ekta V, MD   50 mg at 11/30/23 2128     Discharge Medications: Please see discharge summary for a list of discharge medications.  Relevant Imaging Results:  Relevant Lab Results:   Additional Information SS#: [id number]  Ellis Koffler C  Hadasah Brugger, LCSWA

## 2023-12-01 NOTE — Telephone Encounter (Signed)
 Form completed and faxed back

## 2023-12-01 NOTE — Progress Notes (Signed)
 Mobility Specialist Progress Note:   12/01/23 0920  Mobility  Activity Ambulated with assistance in hallway  Level of Assistance Contact guard assist, steadying assist  Assistive Device Front wheel walker  Distance Ambulated (ft) 175 ft  Activity Response Tolerated well  Mobility Referral Yes  Mobility visit 1 Mobility  Mobility Specialist Start Time (ACUTE ONLY) 0920  Mobility Specialist Stop Time (ACUTE ONLY) 0934  Mobility Specialist Time Calculation (min) (ACUTE ONLY) 14 min   Pt agreeable to mobility session. Required only minG assist to ambulate with RW. Pt with mod SOB, however SpO2 mid 90s on 6LO2. Pt left in chair with all needs met, on 5LO2.   Oneda Big Mobility Specialist Please contact via SecureChat or  Rehab office at [phone]

## 2023-12-01 NOTE — Progress Notes (Signed)
 Patient refused BIPAP for the night. Patient wearing oxygen  set at 5lpm with Sp02=97%

## 2023-12-01 NOTE — Progress Notes (Addendum)
 Physical Therapy Treatment Patient Details Name: Wayne Booth MRN: [id number] DOB: 1941-03-03 Today's Date: 12/01/2023   History of Present Illness Pt is an 83 y/o M admitted on 11/28/23 after presenting with c/o worsening SOB. Pt is being treated for acute on chronic hypoxic respiratory failure. Pt noted to have bilateral pleural effusions. PMH: CAD, a-fib, hypothyroidism, HTN, pulmonary fibrosis related to post-Covid scarring & chronic hypoxemia 5L at baseline    PT Comments  Pt progressing steadily toward goals, limited by declining SpO2 on 6 L during gait.  Emphasis on standing exercise and gait with monitored SpO2 .  Patient now asking for and will benefit from continued inpatient follow up therapy, <3 hours/day    If plan is discharge home, recommend the following: A little help with walking and/or transfers;A little help with bathing/dressing/bathroom;Assistance with cooking/housework;Assist for transportation;Help with stairs or ramp for entrance   Can travel by private vehicle        Equipment Recommendations  None recommended by PT    Recommendations for Other Services       Precautions / Restrictions Precautions Precautions: Fall     Mobility  Bed Mobility               General bed mobility comments: up in the recliner on arrival    Transfers Overall transfer level: Needs assistance   Transfers: Sit to/from Stand Sit to Stand: Supervision           General transfer comment: cues for hand placement/safety    Ambulation/Gait Ambulation/Gait assistance: Contact guard assist Gait Distance (Feet): 170 Feet Assistive device: None Gait Pattern/deviations: Decreased step length - left, Decreased stride length, Decreased step length - right Gait velocity: decreased Gait velocity interpretation: <1.8 ft/sec, indicate of risk for recurrent falls   General Gait Details: short, mildly unsteady steps, but generally safe.  SpO2 on 6L O2 dropped into mid to low  80's, needing coaching for more efficient breathing.  2-3 min to return to 90% while sitting on the toilet.   Stairs             Wheelchair Mobility     Tilt Bed    Modified Rankin (Stroke Patients Only)       Balance     Sitting balance-Leahy Scale: Good       Standing balance-Leahy Scale: Fair                              Hotel manager: No apparent difficulties  Cognition Arousal: Alert Behavior During Therapy: WFL for tasks assessed/performed   PT - Cognitive impairments: No apparent impairments                         Following commands: Intact      Cueing Cueing Techniques: Verbal cues  Exercises General Exercises - Lower Extremity Hip ABduction/ADduction: AROM, Strengthening, Both, 10 reps, Standing Hip Flexion/Marching: AROM, Strengthening, Both, 10 reps, Standing    General Comments General comments (skin integrity, edema, etc.): HR with activity no more than ~100 bpm, sats dropped during gait, but maintained in the lower 90's during standing ex, on 6L [place]      Pertinent Vitals/Pain Pain Assessment Pain Assessment: Faces Faces Pain Scale: No hurt Pain Intervention(s): Monitored during session    Home Living  Prior Function            PT Goals (current goals can now be found in the care plan section) Acute Rehab PT Goals PT Goal Formulation: With patient Time For Goal Achievement: 12/13/23 Potential to Achieve Goals: Good Progress towards PT goals: Progressing toward goals    Frequency    Min 2X/week      PT Plan      Co-evaluation              AM-PAC PT "6 Clicks" Mobility   Outcome Measure  Help needed turning from your back to your side while in a flat bed without using bedrails?: None Help needed moving from lying on your back to sitting on the side of a flat bed without using bedrails?: A Little Help needed moving to and from a  bed to a chair (including a wheelchair)?: A Little Help needed standing up from a chair using your arms (e.g., wheelchair or bedside chair)?: A Little Help needed to walk in hospital room?: A Little Help needed climbing 3-5 steps with a railing? : A Lot 6 Click Score: 18    End of Session Equipment Utilized During Treatment: Oxygen  Activity Tolerance: Patient tolerated treatment well;Patient limited by fatigue Patient left: in chair;with call bell/phone within reach Nurse Communication: Mobility status PT Visit Diagnosis: Unsteadiness on feet (R26.81);Muscle weakness (generalized) (M62.81)     Time: 1610-9604 PT Time Calculation (min) (ACUTE ONLY): 28 min  Charges:    $Gait Training: 8-22 mins $Therapeutic Exercise: 8-22 mins PT General Charges $$ ACUTE PT VISIT: 1 Visit                     12/01/2023  Nohemi Batters., PT Acute Rehabilitation Services [phone]  (office)   Wayne Booth 12/01/2023, 2:28 PM

## 2023-12-01 NOTE — Telephone Encounter (Signed)
 Received faxed  document Home Health Certificate (Order ID [id number] ), to be filled out by provider. Patient requested to send it back via Fax . Document is located in providers tray at front office.Please advise

## 2023-12-01 NOTE — Progress Notes (Signed)
 Progress Note   Patient: Wayne Booth VWU:[id number] DOB: 21-Oct-1940 DOA: 11/28/2023     2 DOS: the patient was seen and examined on 12/01/2023   Brief hospital course: 83 y.o. M with COVID pulmonary fibrosis, dCHF not on diuretic, Afib not on AC, COPD, OSA nonadherent to CPAP, and chronic respiratory failure nonadherent to home O2 presented with progressive SOB.  Chest imaging showed bilateral infiltrates and small effusions.  Admitted on diuretics for CHF flare due to nonadherence.  Assessment and Plan: Acute on chronic diastolic congestive heart failure EF 65 to 70% on echo in March, diastolic dysfunction Continue IV Lasix  Potassium supplement Daily BMP Monitor strict ins and outs, daily weights   Chronic respiratory failure with hypoxia COVID-related lung fibrosis Reports that he has oxygen  at home, uses this when he is in the house walking around but not if he is resting, and not if he leaves the house. States he uses 5L with exertion. Will continue to wean O2. Will et ambulatory oxygen  saturation for Home o2 eval prior to discharge,   Paroxysmal atrial fibrillation, not on anticoagulation In sinus rhythm here, brief episodes of A-fib overnight.  Has refused anticoagulation in the past.   COPD No wheezing   Class I obesity BMI 32, complicates care   OSA Has declined CPAP in the past.  Debility- PT recommended SNF/ STR. TOC working on placement. FL2 signed.     Out of bed to chair. Incentive spirometry. Nursing supportive care. Fall, aspiration precautions. Diet:  Diet Orders (From admission, onward)     Start     Ordered   11/28/23 1802  Diet Heart Room service appropriate? Yes; Fluid consistency: Thin  Diet effective now       Question Answer Comment  Room service appropriate? Yes   Fluid consistency: Thin      11/28/23 1802           DVT prophylaxis: heparin  injection 5,000 Units Start: 11/28/23 2200  Level of care: Telemetry Medical   Code  Status: Full Code  Subjective: Patient is seen and examined today morning. He is sitting in chair. On 5L supplemental O2. Is weak, did not get out of bed. Encouraged to work with PT.  Physical Exam: Vitals:   12/01/23 1300 12/01/23 1600 12/01/23 1620 12/01/23 1958  BP: 115/68 118/77 118/77 112/79  Pulse:  95 93 93  Resp: 18 20 (!) 23 20  Temp:    98.1 F (36.7 C)  TempSrc:    Oral  SpO2: 95% 95% 96% 94%  Weight:      Height:        General - Elderly obese Caucasian male, no apparent distress HEENT - PERRLA, EOMI, atraumatic head, non tender sinuses. Lung - Clear, basal rales, rhonchi, diffuse wheezes. Heart - S1, S2 heard, no murmurs, rubs, trace pedal edema. Abdomen - Soft, non tender, bowel sounds good Neuro - Alert, awake and oriented x 3, non focal exam. Skin - Warm and dry.  Data Reviewed:      Latest Ref Rng & Units 12/01/2023    2:38 AM 11/30/2023    2:23 AM 11/29/2023    5:46 AM  CBC  WBC 4.0 - 10.5 K/uL 8.3  15.6  7.4   Hemoglobin 13.0 - 17.0 g/dL 29.5  62.1  30.8   Hematocrit 39.0 - 52.0 % 44.6  42.4  44.6   Platelets 150 - 400 K/uL 345  348  331       Latest Ref Rng &  Units 12/01/2023    2:38 AM 11/30/2023    2:23 AM 11/29/2023    5:46 AM  BMP  Glucose 70 - 99 mg/dL 409  811  914   BUN 8 - 23 mg/dL 25  24  14    Creatinine 0.61 - 1.24 mg/dL 7.82  9.56  2.13   Sodium 135 - 145 mmol/L 134  136  132   Potassium 3.5 - 5.1 mmol/L 3.8  4.0  4.3   Chloride 98 - 111 mmol/L 95  101  102   CO2 22 - 32 mmol/L 29  28  25    Calcium 8.9 - 10.3 mg/dL 8.6  8.7  8.4    No results found.  Family Communication: Discussed with patient, he understand and agree. All questions answered.  Disposition: Status is: Inpatient Remains inpatient appropriate because: weakness, STR placement.  Planned Discharge Destination: Rehab     Time spent: 39 minutes  Author: Aisha Hove, MD 12/01/2023 10:56 PM Secure chat 7am to 7pm For on call review [email or web address].

## 2023-12-02 DIAGNOSIS — J8410 Pulmonary fibrosis, unspecified: Secondary | ICD-10-CM | POA: Diagnosis not present

## 2023-12-02 DIAGNOSIS — G4733 Obstructive sleep apnea (adult) (pediatric): Secondary | ICD-10-CM | POA: Diagnosis not present

## 2023-12-02 DIAGNOSIS — I5033 Acute on chronic diastolic (congestive) heart failure: Secondary | ICD-10-CM | POA: Diagnosis not present

## 2023-12-02 DIAGNOSIS — E66811 Obesity, class 1: Secondary | ICD-10-CM | POA: Diagnosis not present

## 2023-12-02 MED ORDER — LORAZEPAM 0.5 MG PO TABS
0.5000 mg | ORAL_TABLET | Freq: Once | ORAL | Status: AC
  Administered 2023-12-03: 0.5 mg via ORAL
  Filled 2023-12-02: qty 1

## 2023-12-02 MED ORDER — ALBUTEROL SULFATE (2.5 MG/3ML) 0.083% IN NEBU: 3.0000 mL | INHALATION_SOLUTION | Freq: Four times a day (QID) | RESPIRATORY_TRACT | Status: DC | PRN

## 2023-12-02 NOTE — Progress Notes (Signed)
 Progress Note   Patient: Wayne Booth [id number]:[id number] DOB: [birth year] DOA: 11/28/2023     3 DOS: the patient was seen and examined on 12/02/2023   Brief hospital course: 83 y.o. M with COVID pulmonary fibrosis, dCHF not on diuretic, Afib not on AC, COPD, OSA nonadherent to CPAP, and chronic respiratory failure nonadherent to home O2 presented with progressive SOB.  Chest imaging showed bilateral infiltrates and small effusions.  Admitted on diuretics for CHF flare due to nonadherence.  Assessment and Plan: Acute on chronic diastolic congestive heart failure EF on 08/31/23 is 65 to 70%. Continue IV Lasix  40mg  bid. Monitor daily weights, strict input and output. Daily BMP and electrolyte repletion.   Chronic respiratory failure with hypoxia COVID-related lung fibrosis He is on 2-3L supplemental oxygen  saturating 93%. States he uses 5L with exertion. Check ambulatory oxygen  saturation for Home o2 eval prior to discharge,   Paroxysmal atrial fibrillation, not on anticoagulation In sinus rhythm here, brief episodes of A-fib overnight.  Has refused anticoagulation in the past. Continue telemetry.   COPD No exacerbation. Continue home bronchodilators.   Class I obesity BMI 32, complicates care. Diet, exercise and weight reduction advised.   OSA Has declined CPAP in the past.  Debility- PT recommended SNF/ STR. TOC working on placement. FL2 signed. Also willing to go Home with [facility] is declined by insurance.     Out of bed to chair. Incentive spirometry. Nursing supportive care. Fall, aspiration precautions. Diet:  Diet Orders (From admission, onward)     Start     Ordered   11/28/23 1802  Diet Heart Room service appropriate? Yes; Fluid consistency: Thin  Diet effective now       Question Answer Comment  Room service appropriate? Yes   Fluid consistency: Thin      11/28/23 1802           DVT prophylaxis: heparin  injection 5,000 Units Start: 11/28/23 2200  Level of  care: Telemetry Medical   Code Status: Full Code  Subjective: Patient is seen and examined today morning. He is working with PT. Able to ambulate 100 ft. Feels short of breath.  Physical Exam: Vitals:   12/02/23 1207 12/02/23 1330 12/02/23 1500 12/02/23 1520  BP: 117/81   110/66  Pulse: 82  83 84  Resp: 17 (!) 25 (!) 24 (!) 25  Temp: (!) 97.5 F (36.4 C)   98.1 F (36.7 C)  TempSrc: Oral   Oral  SpO2: 96%  98% 97%  Weight:      Height:        General - Elderly obese Caucasian male, mild distress with exertion. HEENT - PERRLA, EOMI, atraumatic head, non tender sinuses. Lung - Clear, basal rales, rhonchi, diffuse wheezes. Heart - S1, S2 heard, no murmurs, rubs, trace pedal edema. Abdomen - Soft, non tender, bowel sounds good Neuro - Alert, awake and oriented x 3, non focal exam. Skin - Warm and dry.  Data Reviewed:      Latest Ref Rng & Units 12/01/2023    2:38 AM 11/30/2023    2:23 AM 11/29/2023    5:46 AM  CBC  WBC 4.0 - 10.5 K/uL 8.3  15.6  7.4   Hemoglobin 13.0 - 17.0 g/dL 95.2  84.1  32.4   Hematocrit 39.0 - 52.0 % 44.6  42.4  44.6   Platelets 150 - 400 K/uL 345  348  331       Latest Ref Rng & Units 12/01/2023    2:38  AM 11/30/2023    2:23 AM 11/29/2023    5:46 AM  BMP  Glucose 70 - 99 mg/dL 161  096  045   BUN 8 - 23 mg/dL 25  24  14    Creatinine 0.61 - 1.24 mg/dL 4.09  8.11  9.14   Sodium 135 - 145 mmol/L 134  136  132   Potassium 3.5 - 5.1 mmol/L 3.8  4.0  4.3   Chloride 98 - 111 mmol/L 95  101  102   CO2 22 - 32 mmol/L 29  28  25    Calcium 8.9 - 10.3 mg/dL 8.6  8.7  8.4    No results found.  Family Communication: Discussed with patient, he understand and agree. All questions answered.  Disposition: Status is: Inpatient Remains inpatient appropriate because: HH vs STR placement.  Planned Discharge Destination: Rehab     Time spent: 39 minutes  Author: Aisha Hove, MD 12/02/2023 6:03 PM Secure chat 7am to 7pm For on call review  [email or web address].

## 2023-12-02 NOTE — TOC Progression Note (Addendum)
 Transition of Care [facility]) - Progression Note    Patient Details  Name: Wayne Booth MRN: [id number] Date of Birth: August 22, 1940  [facility]) CM/SW Contact  Arron Big, [place] Phone Number: 12/02/2023, 11:58 AM  Clinical Narrative:   CSW reached out to [facility] liaison to review  patient referral.   3:09 PM CSW left a VM for [place] w/ admissions at [facility].   3:43 PM CSW spoke with patient about [facility] not responding to patient referral. CSW asked if he would like to choose another facility. Patient stated no, he only wants to go to [facility]. CSW asked patient if he is okay to dc home w/ HH in the event [facility] does not respond and MD states he is medically ready - patient is agreeable to going home w/ HH if he cannot go to [facility]. CSW notified MD.   TOC will continue to follow.           Expected Discharge Plan and Services         Expected Discharge Date: 11/30/23                                     Social Determinants of Health (SDOH) Interventions SDOH Screenings   Food Insecurity: No Food Insecurity (11/29/2023)  Housing: Low Risk  (11/29/2023)  Transportation Needs: No Transportation Needs (11/29/2023)  Utilities: Not At Risk (11/29/2023)  Depression (PHQ2-9): High Risk (02/11/2023)  Financial Resource Strain: Low Risk  (01/04/2023)  Physical Activity: Inactive (01/04/2023)  Social Connections: Moderately Integrated (11/29/2023)  Stress: No Stress Concern Present (01/04/2023)  Tobacco Use: Low Risk  (11/28/2023)    Readmission Risk Interventions    10/20/2023   11:54 AM  Readmission Risk Prevention Plan  Transportation Screening Complete  PCP or Specialist Appt within 5-7 Days Complete  Home Care Screening Complete  Medication Review (RN CM) Complete

## 2023-12-02 NOTE — Progress Notes (Signed)
   12/02/23 0920  Mobility  Activity Ambulated with assistance in hallway  Level of Assistance Contact guard assist, steadying assist  Assistive Device Front wheel walker  Distance Ambulated (ft) 100 ft  Activity Response Tolerated well  Mobility Referral Yes  Mobility visit 1 Mobility  Mobility Specialist Start Time (ACUTE ONLY) 0920  Mobility Specialist Stop Time (ACUTE ONLY) 0932  Mobility Specialist Time Calculation (min) (ACUTE ONLY) 12 min   Pt agreeable to mobility session. Required only minG assist to ambulate with RW. 6LO2 needed to maintain SpO2 WFL. Pt displayed SOB throughout, back in chair with all needs met.   Oneda Big Mobility Specialist Please contact via SecureChat or  Rehab office at [phone]

## 2023-12-02 NOTE — Plan of Care (Signed)

## 2023-12-03 ENCOUNTER — Telehealth: Payer: Self-pay | Admitting: Family Medicine

## 2023-12-03 ENCOUNTER — Inpatient Hospital Stay: Admitting: Family Medicine

## 2023-12-03 ENCOUNTER — Inpatient Hospital Stay (HOSPITAL_BASED_OUTPATIENT_CLINIC_OR_DEPARTMENT_OTHER): Admitting: Primary Care

## 2023-12-03 DIAGNOSIS — J8410 Pulmonary fibrosis, unspecified: Secondary | ICD-10-CM | POA: Diagnosis not present

## 2023-12-03 DIAGNOSIS — G4733 Obstructive sleep apnea (adult) (pediatric): Secondary | ICD-10-CM | POA: Diagnosis not present

## 2023-12-03 DIAGNOSIS — E66811 Obesity, class 1: Secondary | ICD-10-CM | POA: Diagnosis not present

## 2023-12-03 DIAGNOSIS — I5033 Acute on chronic diastolic (congestive) heart failure: Secondary | ICD-10-CM | POA: Diagnosis not present

## 2023-12-03 MED ORDER — TRAZODONE HCL 50 MG PO TABS: 50.0000 mg | ORAL_TABLET | Freq: Every day | ORAL | 0 refills | Status: DC

## 2023-12-03 MED ORDER — ONDANSETRON HCL 4 MG/2ML IJ SOLN
4.0000 mg | INTRAMUSCULAR | Status: DC | PRN
  Administered 2023-12-03: 4 mg via INTRAVENOUS
  Filled 2023-12-03: qty 2

## 2023-12-03 NOTE — Plan of Care (Signed)
  Problem: Education: Goal: Knowledge of General Education information will improve Description: Including pain rating scale, medication(s)/side effects and non-pharmacologic comfort measures Outcome: Progressing   Problem: Health Behavior/Discharge Planning: Goal: Ability to manage health-related needs will improve Outcome: Progressing   Problem: Clinical Measurements: Goal: Diagnostic test results will improve Outcome: Progressing Goal: Respiratory complications will improve Outcome: Progressing   Problem: Nutrition: Goal: Adequate nutrition will be maintained Outcome: Progressing

## 2023-12-03 NOTE — Telephone Encounter (Signed)
 I received the refill request for trazodone -he needs to schedule follow-up with me after hospital

## 2023-12-03 NOTE — Plan of Care (Signed)
  Problem: Health Behavior/Discharge Planning: Goal: Ability to manage health-related needs will improve Outcome: Progressing   Problem: Clinical Measurements: Goal: Will remain free from infection Outcome: Progressing   Problem: Clinical Measurements: Goal: Diagnostic test results will improve Outcome: Progressing   Problem: Clinical Measurements: Goal: Respiratory complications will improve Outcome: Progressing   

## 2023-12-03 NOTE — TOC Progression Note (Addendum)
 Transition of Care [facility]) - Progression Note    Patient Details  Name: Wayne Booth MRN: [id number] Date of Birth: 1941-01-30  Transition of Care [facility]) CM/SW Contact  Wayne Colace, RN Phone Number: 12/03/2023, 2:04 PM  Clinical Narrative:     Discussed with team, No bed available at [facility], paitent is stating he wants to go home with [facility]  and not another SNF per CSW. He is already active with [facility] and [name] from [facility]  and patient is a aware he will likely DC tomorrow. HH orders are in.  1500 Notified that he is now interested in other SNF since speaking to his son, [name] out in [place] for [facility]'s No further needs identified.       Expected Discharge Plan and Services         Expected Discharge Date: 11/30/23                                     Social Determinants of Health (SDOH) Interventions SDOH Screenings   Food Insecurity: No Food Insecurity (11/29/2023)  Housing: Low Risk  (11/29/2023)  Transportation Needs: No Transportation Needs (11/29/2023)  Utilities: Not At Risk (11/29/2023)  Depression (PHQ2-9): High Risk (02/11/2023)  Financial Resource Strain: Low Risk  (01/04/2023)  Physical Activity: Inactive (01/04/2023)  Social Connections: Moderately Integrated (11/29/2023)  Stress: No Stress Concern Present (01/04/2023)  Tobacco Use: Low Risk  (11/28/2023)    Readmission Risk Interventions    10/20/2023   11:54 AM  Readmission Risk Prevention Plan  Transportation Screening Complete  PCP or Specialist Appt within 5-7 Days Complete  Home Care Screening Complete  Medication Review (RN CM) Complete

## 2023-12-03 NOTE — Progress Notes (Signed)
 Progress Note   Patient: Wayne Booth:[id number] DOB: 13-May-1941 DOA: 11/28/2023     4 DOS: the patient was seen and examined on 12/03/2023   Brief hospital course: 83 y.o. M with COVID pulmonary fibrosis, dCHF not on diuretic, Afib not on AC, COPD, OSA nonadherent to CPAP, and chronic respiratory failure nonadherent to home O2 presented with progressive SOB.  Chest imaging showed bilateral infiltrates and small effusions.  Admitted on diuretics for CHF flare due to nonadherence.  Assessment and Plan: Acute on chronic diastolic congestive heart failure EF on 08/31/23 is 65 to 70%. Continue IV Lasix  40mg  bid. Kidney function, electrolytes stable. Monitor daily weights, strict input and output. Daily BMP and electrolyte repletion.   Chronic respiratory failure with hypoxia COVID-related lung fibrosis He is on 2L supplemental oxygen  saturating 95% while resting. States he uses 5L with exertion. Check ambulatory oxygen  saturation for Home o2 eval prior to discharge.   Paroxysmal atrial fibrillation, not on anticoagulation In sinus rhythm here, brief episodes of A-fib episodes.  Has refused anticoagulation in the past. Continue telemetry.   COPD No exacerbation. Continue home bronchodilators.   Class I obesity BMI 32, complicates care. Diet, exercise and weight reduction advised.   OSA Has declined CPAP in the past.  Debility- PT recommended SNF/ STR. TOC working on placement. FL2 signed. His choice facility does not have beds, family want him to go to other facility. He willing to go Home with [facility] if rehab declined by insurance.     Out of bed to chair. Incentive spirometry. Nursing supportive care. Fall, aspiration precautions. Diet:  Diet Orders (From admission, onward)     Start     Ordered   11/28/23 1802  Diet Heart Room service appropriate? Yes; Fluid consistency: Thin  Diet effective now       Question Answer Comment  Room service appropriate? Yes   Fluid  consistency: Thin      11/28/23 1802           DVT prophylaxis: heparin  injection 5,000 Units Start: 11/28/23 2200  Level of care: Telemetry Medical   Code Status: Full Code  Subjective: Patient is seen and examined today morning. He is sitting in chair, no complaints. Feels better than last few days. He is awaiting for placement.  Physical Exam: Vitals:   12/03/23 0204 12/03/23 0427 12/03/23 0806 12/03/23 1253  BP: 126/64 105/80 139/79 119/73  Pulse: 88 82 85 82  Resp: 20 20 (!) 29 (!) 22  Temp: 97.6 F (36.4 C) 97.6 F (36.4 C) (!) 97.5 F (36.4 C) 97.6 F (36.4 C)  TempSrc: Oral Oral Oral Oral  SpO2: 93% 99% 91% 98%  Weight:  106.1 kg    Height:        General - Elderly obese Caucasian male, mild distress with exertion. HEENT - PERRLA, EOMI, atraumatic head, non tender sinuses. Lung - Clear, basal rales, rhonchi, diffuse wheezes. Heart - S1, S2 heard, no murmurs, rubs, trace pedal edema. Abdomen - Soft, non tender, bowel sounds good Neuro - Alert, awake and oriented x 3, non focal exam. Skin - Warm and dry.  Data Reviewed:      Latest Ref Rng & Units 12/01/2023    2:38 AM 11/30/2023    2:23 AM 11/29/2023    5:46 AM  CBC  WBC 4.0 - 10.5 K/uL 8.3  15.6  7.4   Hemoglobin 13.0 - 17.0 g/dL 81.1  91.4  78.2   Hematocrit 39.0 - 52.0 % 44.6  42.4  44.6   Platelets 150 - 400 K/uL 345  348  331       Latest Ref Rng & Units 12/01/2023    2:38 AM 11/30/2023    2:23 AM 11/29/2023    5:46 AM  BMP  Glucose 70 - 99 mg/dL 102  725  366   BUN 8 - 23 mg/dL 25  24  14    Creatinine 0.61 - 1.24 mg/dL 4.40  3.47  4.25   Sodium 135 - 145 mmol/L 134  136  132   Potassium 3.5 - 5.1 mmol/L 3.8  4.0  4.3   Chloride 98 - 111 mmol/L 95  101  102   CO2 22 - 32 mmol/L 29  28  25    Calcium 8.9 - 10.3 mg/dL 8.6  8.7  8.4    No results found.  Family Communication: Discussed with patient, he understand and agree. All questions answered.  Disposition: Status is: Inpatient Remains  inpatient appropriate because: HH vs STR placement.  Planned Discharge Destination: Rehab     Time spent: 39 minutes  Author: Aisha Hove, MD 12/03/2023 3:37 PM Secure chat 7am to 7pm For on call review [email or web address].

## 2023-12-03 NOTE — Progress Notes (Signed)
 Physical Therapy Treatment Patient Details Name: Wayne Booth MRN: [id number] DOB: [birth year] Today's Date: 12/03/2023   History of Present Illness Pt is an 83 y/o M admitted on 11/28/23 after presenting with c/o worsening SOB. Pt is being treated for acute on chronic hypoxic respiratory failure. Pt noted to have bilateral pleural effusions. 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 Introduced Occupational hygienist to aid in energy conservation and improve pt's stability with gait. Educated pt on proper sequencing and safe use of AD. He ambulated ~178ft with CGA twice and took a prolonged seated rest break on rollator seat. Pt required moderate VC for increased safety awareness while using rollator. Will continue to follow acutely and advance appropriately.      If plan is discharge home, recommend the following: A little help with walking and/or transfers;A little help with bathing/dressing/bathroom;Assistance with cooking/housework;Assist for transportation;Help with stairs or ramp for entrance   Can travel by private vehicle     Yes  Equipment Recommendations  Rollator (4 wheels)    Recommendations for Other Services       Precautions / Restrictions Precautions Precautions: Fall Recall of Precautions/Restrictions: Intact Precaution/Restrictions Comments: watch SpO2 Restrictions Weight Bearing Restrictions Per Provider Order: No     Mobility  Bed Mobility               General bed mobility comments: Not assessed. Pt greeted in recliner chair and returned there at end of session.    Transfers Overall transfer level: Needs assistance Equipment used: None, Rollator (4 wheels) Transfers: Sit to/from Stand Sit to Stand: Supervision, Contact guard assist           General transfer comment: Pt stood from  recliner chair by pushing up with BUE support. Good eccentric control with sitting. Educated pt on proper sequencing using rollator. Cued him to lock/unlock breaks and guided him through sitting down and standing up from rollator seat with increased to CGA for support.    Ambulation/Gait Ambulation/Gait assistance: Contact guard assist Gait Distance (Feet): 100 Feet (x3, prolonged seated rest break) Assistive device: None, Rollator (4 wheels) (pushed O2 tank) Gait Pattern/deviations: Decreased step length - left, Decreased stride length, Decreased step length - right, Drifts right/left, Trunk flexed Gait velocity: decreased Gait velocity interpretation: <1.8 ft/sec, indicate of risk for recurrent falls   General Gait Details: Pt ambulated with short steps and requested to push O2 tank. He maintained an excessive fwd lean and was unsteady. Pt took prolonged seated rest break to recover from desaturation. Introduced Occupational hygienist and education pt on proper use of AD. He ambulated with a reciprocal gait pattern and upright posture using rollator. Pt took a seated rest break on rollator seat prior to returning to room.   Stairs             Wheelchair Mobility     Tilt Bed    Modified Rankin (Stroke Patients Only)       Balance Overall balance assessment: Needs assistance Sitting-balance support: Feet supported Sitting balance-Leahy Scale: Good     Standing balance support: Single extremity supported, Bilateral upper extremity supported, During functional activity Standing balance-Leahy Scale: Fair Standing balance comment: Pt unsteady ambulating while pushing O2 tank, CGA for safety. Pt demonstrated improved stability when ambulating using rollator.  Communication Communication Communication: No apparent difficulties  Cognition Arousal: Alert Behavior During Therapy: WFL for tasks assessed/performed   PT - Cognitive impairments: No apparent  impairments                         Following commands: Intact      Cueing Cueing Techniques: Verbal cues, Gestural cues  Exercises      General Comments General comments (skin integrity, edema, etc.): Pt greeted on 3L O2 via [place]. He desaturated to 85% SpO2 while ambulating. Recovered to >88% SpO2 following seated rest.      Pertinent Vitals/Pain Pain Assessment Pain Assessment: No/denies pain    Home Living                          Prior Function            PT Goals (current goals can now be found in the care plan section) Acute Rehab PT Goals Patient Stated Goal: Return Home Progress towards PT goals: Progressing toward goals    Frequency    Min 2X/week      PT Plan      Co-evaluation              AM-PAC PT "6 Clicks" Mobility   Outcome Measure  Help needed turning from your back to your side while in a flat bed without using bedrails?: A Little Help needed moving from lying on your back to sitting on the side of a flat bed without using bedrails?: A Little Help needed moving to and from a bed to a chair (including a wheelchair)?: A Little Help needed standing up from a chair using your arms (e.g., wheelchair or bedside chair)?: A Little Help needed to walk in hospital room?: A Little Help needed climbing 3-5 steps with a railing? : A Lot 6 Click Score: 17    End of Session Equipment Utilized During Treatment: Gait belt;Oxygen  Activity Tolerance: Patient tolerated treatment well;Patient limited by fatigue Patient left: in chair;with call bell/phone within reach Nurse Communication: Mobility status;Other (comment) (desaturation) PT Visit Diagnosis: Unsteadiness on feet (R26.81);Muscle weakness (generalized) (M62.81)     Time: 1610-9604 PT Time Calculation (min) (ACUTE ONLY): 24 min  Charges:    $Gait Training: 23-37 mins PT General Charges $$ ACUTE PT VISIT: 1 Visit                     Glenford Lanes, PT, DPT Acute  Rehabilitation Services Office: [phone] Secure Chat Preferred  Riva Chester 12/03/2023, 4:02 PM

## 2023-12-03 NOTE — Progress Notes (Signed)
 Mobility Specialist Progress Note:   12/03/23 0920  Mobility  Activity Ambulated with assistance in hallway  Level of Assistance Contact guard assist, steadying assist  Assistive Device Front wheel walker  Distance Ambulated (ft) 150 ft  Activity Response Tolerated well  Mobility Referral Yes  Mobility visit 1 Mobility  Mobility Specialist Start Time (ACUTE ONLY) 0920  Mobility Specialist Stop Time (ACUTE ONLY) 0932  Mobility Specialist Time Calculation (min) (ACUTE ONLY) 12 min   Pt agreeable to mobility session. Required only minG assist to ambulate with RW. Pt desat to 87% on 2LO2, requiring 3LO2 to maintain SpO2 WFL. Displayed moderate SOB throughout. Back in chair with all needs met.   Oneda Big Mobility Specialist Please contact via SecureChat or  Rehab office at [phone]

## 2023-12-03 NOTE — Treatment Plan (Signed)
 Called wife back. There was no answer. [phone] per secretary

## 2023-12-03 NOTE — TOC Progression Note (Addendum)
 Transition of Care [facility]) - Progression Note    Patient Details  Name: Wayne Booth MRN: [id number] Date of Birth: Jan 22, 1941  Transition of [facility]) CM/SW Contact  Arron Big, [place] Phone Number: 12/03/2023, 10:30 AM  Clinical Narrative:   Patient referral for [facility] still pending. CSW left second vm for admissions.   1:47 PM CSW spoke with [place] w/ [facility] around 12:38PM. They do not have any bed availability at this time. CSW will notify patient and notified treatment team.   1:53PM CSW met patient in room and discussed [facility] not having bed availability. Patient is agreeable to going home. RNCM and MD notified.   TOC will continue to follow.           Expected Discharge Plan and Services         Expected Discharge Date: 11/30/23                                     Social Determinants of Health (SDOH) Interventions SDOH Screenings   Food Insecurity: No Food Insecurity (11/29/2023)  Housing: Low Risk  (11/29/2023)  Transportation Needs: No Transportation Needs (11/29/2023)  Utilities: Not At Risk (11/29/2023)  Depression (PHQ2-9): High Risk (02/11/2023)  Financial Resource Strain: Low Risk  (01/04/2023)  Physical Activity: Inactive (01/04/2023)  Social Connections: Moderately Integrated (11/29/2023)  Stress: No Stress Concern Present (01/04/2023)  Tobacco Use: Low Risk  (11/28/2023)    Readmission Risk Interventions    10/20/2023   11:54 AM  Readmission Risk Prevention Plan  Transportation Screening Complete  PCP or Specialist Appt within 5-7 Days Complete  Home Care Screening Complete  Medication Review (RN CM) Complete

## 2023-12-04 ENCOUNTER — Other Ambulatory Visit (HOSPITAL_COMMUNITY): Payer: Self-pay

## 2023-12-04 DIAGNOSIS — J449 Chronic obstructive pulmonary disease, unspecified: Secondary | ICD-10-CM | POA: Diagnosis not present

## 2023-12-04 DIAGNOSIS — J8410 Pulmonary fibrosis, unspecified: Secondary | ICD-10-CM | POA: Diagnosis not present

## 2023-12-04 DIAGNOSIS — I5033 Acute on chronic diastolic (congestive) heart failure: Secondary | ICD-10-CM | POA: Diagnosis not present

## 2023-12-04 DIAGNOSIS — E66811 Obesity, class 1: Secondary | ICD-10-CM | POA: Diagnosis not present

## 2023-12-04 MED ORDER — SENNOSIDES-DOCUSATE SODIUM 8.6-50 MG PO TABS: 2.0000 | ORAL_TABLET | Freq: Two times a day (BID) | ORAL | Status: DC

## 2023-12-04 MED ORDER — FUROSEMIDE 20 MG PO TABS: 40.0000 mg | ORAL_TABLET | Freq: Two times a day (BID) | ORAL | 0 refills | Status: DC

## 2023-12-04 MED ORDER — SENNOSIDES-DOCUSATE SODIUM 8.6-50 MG PO TABS
2.0000 | ORAL_TABLET | Freq: Two times a day (BID) | ORAL | 0 refills | Status: DC
Start: 2023-12-04 — End: 2023-12-21

## 2023-12-04 NOTE — Discharge Summary (Signed)
 Physician Discharge Summary   Patient: Wayne Booth MRN: [id number] DOB: April 17, 1941  Admit date:     11/28/2023  Discharge date: 12/04/23  Discharge Physician: Aisha Hove   PCP: Almira Jaeger, MD   Recommendations at discharge:  {Tip this will not be part of the note when signed- Example include specific recommendations for outpatient follow-up, pending tests to follow-up on. (Optional):26781}  ***  Discharge Diagnoses: Principal Problem:   Acute on chronic diastolic CHF (congestive heart failure) (HCC) Active Problems:   OSA (obstructive sleep apnea)   Peripheral neuropathy   Chronic respiratory failure with hypoxia (HCC)   Post-COVID pulmonary fibrosis (HCC)   COPD (chronic obstructive pulmonary disease) (HCC)   Class 1 obesity  Resolved Problems:   * No resolved hospital problems. [facility] Course: No notes on file  Assessment and Plan: No notes have been filed under this hospital service. Service: Hospitalist     {Tip this will not be part of the note when signed Body mass index is 32.47 kg/m. , ,  (Optional):26781}  {(NOTE) Pain control PDMP Statment (Optional):26782} Consultants: *** Procedures performed: ***  Disposition: {Plan; Disposition:26390} Diet recommendation:  Discharge Diet Orders (From admission, onward)     Start     Ordered   12/04/23 0000  Diet - low sodium heart healthy        12/04/23 1222   11/30/23 0000  Diet - low sodium heart healthy        11/30/23 0934           {Diet_Plan:26776} DISCHARGE MEDICATION: Allergies as of 12/04/2023       Reactions   Ambien  [zolpidem ] Anxiety, Other (See Comments)   Anxiety and agitation when tried as sleeper in hospital aug 2019        Medication List     TAKE these medications    albuterol  (2.5 MG/3ML) 0.083% nebulizer solution Commonly known as: PROVENTIL  Take 3 mLs (2.5 mg total) by nebulization every 6 (six) hours as needed for wheezing or shortness of  breath. What changed: Another medication with the same name was changed. Make sure you understand how and when to take each.   albuterol  108 (90 Base) MCG/ACT inhaler Commonly known as: VENTOLIN  HFA INHALE 2 PUFFS BY MOUTH EVERY 6 HOURS AS NEEDED FOR WHEEZING OR SHORTNESS OF BREATH What changed: See the new instructions.   budesonide  0.25 MG/2ML nebulizer solution Commonly known as: PULMICORT  Take 2 mLs (0.25 mg total) by nebulization 2 (two) times daily. What changed: when to take this   busPIRone  5 MG tablet Commonly known as: BUSPAR  Take 5 mg by mouth daily as needed (anxiety).   escitalopram  20 MG tablet Commonly known as: LEXAPRO  TAKE 1 TABLET BY MOUTH DAILY   furosemide  20 MG tablet Commonly known as: LASIX  Take 2 tablets (40 mg total) by mouth 2 (two) times daily. What changed:  how much to take when to take this   gabapentin  300 MG capsule Commonly known as: NEURONTIN  Take 1 capsule (300 mg total) by mouth 3 (three) times daily.   ipratropium-albuterol  0.5-2.5 (3) MG/3ML Soln Commonly known as: DUONEB Take 3 mLs by nebulization every 6 (six) hours as needed. What changed: reasons to take this   LORazepam  0.5 MG tablet Commonly known as: ATIVAN  Take 1 tablet (0.5 mg total) by mouth daily as needed for anxiety (do not take within 8 hours of bed. do not drive for 8 hours after taking.). What changed: reasons to take this  ondansetron  4 MG tablet Commonly known as: ZOFRAN  Take 1 tablet (4 mg total) by mouth every 6 (six) hours as needed for nausea.   OXYGEN  Inhale 5 L into the lungs continuous.   senna-docusate 8.6-50 MG tablet Commonly known as: Senokot-S Take 2 tablets by mouth 2 (two) times daily.   traMADol  50 MG tablet Commonly known as: ULTRAM  Take 1 tablet (50 mg total) by mouth every 6 (six) hours as needed (prefer max 3 per day). What changed: reasons to take this   traZODone  50 MG tablet Commonly known as: DESYREL  Take 1 tablet (50 mg total)  by mouth at bedtime. What changed: See the new instructions.        Follow-up Information     Lind Repine, MD Follow up in 1 month(s).   Specialty: Pulmonary Disease Contact information: [address] [place] [address] [phone]         [facility] Follow up.   Specialty: Home Health Services Why: [facility] Contact information: [address] [place] [address] [phone]                Discharge Exam: Filed Weights   12/02/23 0533 12/03/23 0427 12/04/23 0500  Weight: 105.6 kg 106.1 kg 105.6 kg   ***  Condition at discharge: {DC Condition:26389}  The results of significant diagnostics from this hospitalization (including imaging, microbiology, ancillary and laboratory) are listed below for reference.   Imaging Studies: CT Angio Chest Pulmonary Embolism (PE) W or WO Contrast Result Date: 11/28/2023 CLINICAL DATA:  Pulmonary embolus suspected with high probability. Respiratory distress. Sudden onset shortness of breath. EXAM: CT ANGIOGRAPHY CHEST WITH CONTRAST TECHNIQUE: Multidetector CT imaging of the chest was performed using the standard protocol during bolus administration of intravenous contrast. Multiplanar CT image reconstructions and MIPs were obtained to evaluate the vascular anatomy. RADIATION DOSE REDUCTION: This exam was performed according to the departmental dose-optimization program which includes automated exposure control, adjustment of the mA and/or kV according to patient size and/or use of iterative reconstruction technique. CONTRAST:  65mL OMNIPAQUE  IOHEXOL  350 MG/ML SOLN COMPARISON:  Chest radiograph 11/28/2023.  CT chest 08/29/2023 FINDINGS: Cardiovascular: Technically adequate study with good opacification of the central and segmental pulmonary arteries. No focal filling defects. No evidence of significant pulmonary embolus. Cardiac enlargement. Calcification in the mitral valve  annulus. No pericardial effusions. Normal caliber thoracic aorta. No dissection. Calcification in the aorta and coronary arteries. Mediastinum/Nodes: Thyroid  gland is unremarkable. Esophagus is decompressed. Mediastinal lymphadenopathy with largest lymph nodes in the subcarinal region measuring about 1.5 cm diameter. Etiology is nonspecific but likely reactive. Similar appearance to prior study. Lungs/Pleura: Consolidation or atelectasis in both lung bases with patchy airspace disease throughout the remainder of both lungs. Changes could represent multifocal pneumonia or possibly aspiration. Air bronchograms are present. Small bilateral pleural effusions, slightly greater on the left. No pneumothorax. Upper Abdomen: Surgical absence of the left kidney. No acute abnormality. Musculoskeletal: Degenerative changes in the spine. Postoperative change in the cervical spine. No acute bony abnormalities. Review of the MIP images confirms the above findings. IMPRESSION: 1. No evidence of significant pulmonary embolus. 2. Patchy infiltrates throughout both lungs with consolidation or atelectasis in both lung bases. This may represent multifocal pneumonia, edema, or aspiration. 3. Small bilateral pleural effusions. 4. Aortic atherosclerosis. 5. Enlarged mediastinal lymph nodes, similar to prior study, most likely to be reactive. Electronically Signed   By: Boyce Byes M.D.   On: 11/28/2023 20:41  DG Chest Port 1 View Result Date: 11/28/2023 CLINICAL DATA:  SOB EXAM: PORTABLE CHEST - 1 VIEW COMPARISON:  None available. FINDINGS: Low lung volumes. Unchanged small bilateral pleural effusions, slightly larger on the left than the right. Patchy airspace opacities in the right mid and lower lung zones. Minimal retrocardiac airspace opacities. Unchanged cardiomegaly. Tortuous aorta with aortic atherosclerosis. No acute fracture or destructive lesion. Multilevel degenerative disc disease of the spine. Cholecystectomy clips.  IMPRESSION: Small bilateral pleural effusions, slightly larger on the left than the right. Patchy airspace opacities in the right mid lung and both lower lung zones, possibly atelectasis or bronchopneumonia in the correct clinical context. Electronically Signed   By: Rance Burrows M.D.   On: 11/28/2023 16:55    Microbiology: Results for orders placed or performed during the hospital encounter of 11/28/23  Resp panel by RT-PCR (RSV, Flu A&B, Covid) Anterior Nasal Swab     Status: None   Collection Time: 11/28/23  3:58 PM   Specimen: Anterior Nasal Swab  Result Value Ref Range Status   SARS Coronavirus 2 by RT PCR NEGATIVE NEGATIVE Final   Influenza A by PCR NEGATIVE NEGATIVE Final   Influenza B by PCR NEGATIVE NEGATIVE Final    Comment: (NOTE) The Xpert Xpress SARS-CoV-2/FLU/RSV plus assay is intended as an aid in the diagnosis of influenza from Nasopharyngeal swab specimens and should not be used as a sole basis for treatment. Nasal washings and aspirates are unacceptable for Xpert Xpress SARS-CoV-2/FLU/RSV testing.  Fact Sheet for Patients: BloggerCourse.com  Fact Sheet for Healthcare Providers: SeriousBroker.it  This test is not yet approved or cleared by the [place]  FDA and has been authorized for detection and/or diagnosis of SARS-CoV-2 by FDA under an Emergency Use Authorization (EUA). This EUA will remain in effect (meaning this test can be used) for the duration of the COVID-19 declaration under Section 564(b)(1) of the Act, 21 U.S.C. section 360bbb-3(b)(1), unless the authorization is terminated or revoked.     Resp Syncytial Virus by PCR NEGATIVE NEGATIVE Final    Comment: (NOTE) Fact Sheet for Patients: BloggerCourse.com  Fact Sheet for Healthcare Providers: SeriousBroker.it  This test is not yet approved or cleared by the [place]  FDA and has been  authorized for detection and/or diagnosis of SARS-CoV-2 by FDA under an Emergency Use Authorization (EUA). This EUA will remain in effect (meaning this test can be used) for the duration of the COVID-19 declaration under Section 564(b)(1) of the Act, 21 U.S.C. section 360bbb-3(b)(1), unless the authorization is terminated or revoked.  Performed at [facility], [address], [place], [place] [address]    *Note: Due to a large number of results and/or encounters for the requested time period, some results have not been displayed. A complete set of results can be found in Results Review.    Labs: CBC: Recent Labs  Lab 11/28/23 1558 11/29/23 0546 11/30/23 0223 12/01/23 0238  WBC 8.4 7.4 15.6* 8.3  NEUTROABS 5.4  --   --   --   HGB 14.2 14.9 14.2 14.7  HCT 43.3 44.6 42.4 44.6  MCV 91.7 89.4 88.9 89.9  PLT 326 331 348 345   Basic Metabolic Panel: Recent Labs  Lab 11/28/23 1558 11/29/23 0546 11/30/23 0223 12/01/23 0238  NA 136 132* 136 134*  K 4.2 4.3 4.0 3.8  CL 99 102 101 95*  CO2 28 25 28 29   GLUCOSE 83 186* 117* 160*  BUN 10 14 24* 25*  CREATININE 1.04 1.12  1.28* 1.14  CALCIUM 9.1 8.4* 8.7* 8.6*  MG  --   --  2.1  --    Liver Function Tests: Recent Labs  Lab 11/28/23 1558 11/29/23 0546  AST 24 23  ALT 17 20  ALKPHOS 63 64  BILITOT 0.8 0.7  PROT 8.2* 8.3*  ALBUMIN  3.1* 2.9*   CBG: No results for input(s): "GLUCAP" in the last 168 hours.  Discharge time spent: {LESS THAN/GREATER OZDG:64403} 30 minutes.  Signed: Aisha Hove, MD [facility] 12/04/2023

## 2023-12-04 NOTE — TOC Transition Note (Signed)
 Transition of Care [facility]) - Discharge Note   Patient Details  Name: Wayne Booth MRN: [id number] Date of Birth: 06-26-1941  [facility] CM/SW Contact:  Omie Bickers, RN Phone Number: 12/04/2023, 12:35 PM   Clinical Narrative:     Patient will DC to home.  Patient is active with [facility].  They have been notified of DC today. CSW spoke w son who will be providing transport home  Final next level of care: Home w Home Health Services Barriers to Discharge: No Barriers Identified   Patient Goals and CMS Choice Patient states their goals for this hospitalization and ongoing recovery are:: to go home CMS Medicare.gov Compare Post Acute Care list provided to:: Patient Choice offered to / list presented to : Patient      Discharge Placement                       Discharge Plan and Services Additional resources added to the After Visit Summary for                  DME Arranged: N/A         HH Arranged: PT, OT HH Agency: [facility] Date [facility] Agency Contacted: 12/04/23 Time HH Agency Contacted: 1235 Representative spoke with at [facility] Agency: [name]  Social Drivers of Health (SDOH) Interventions SDOH Screenings   Food Insecurity: No Food Insecurity (11/29/2023)  Housing: Low Risk  (11/29/2023)  Transportation Needs: No Transportation Needs (11/29/2023)  Utilities: Not At Risk (11/29/2023)  Depression (PHQ2-9): High Risk (02/11/2023)  Financial Resource Strain: Low Risk  (01/04/2023)  Physical Activity: Inactive (01/04/2023)  Social Connections: Moderately Integrated (11/29/2023)  Stress: No Stress Concern Present (01/04/2023)  Tobacco Use: Low Risk  (11/28/2023)     Readmission Risk Interventions    10/20/2023   11:54 AM  Readmission Risk Prevention Plan  Transportation Screening Complete  PCP or Specialist Appt within 5-7 Days Complete  Home Care Screening Complete  Medication Review (RN CM) Complete

## 2023-12-04 NOTE — TOC Progression Note (Signed)
 Transition of Care [facility]) - Progression Note    Patient Details  Name: Wayne Booth MRN: [id number] Date of Birth: 1941-01-08  Transition [facility] Care [facility]) CM/SW Contact  Jarrell Merritts, LCSW Phone Number: 12/04/2023, 10:53 AM  Clinical Narrative:     CSW met with pt to give him current bed offers. CSW discussed the medicare.gov ratings with each bed offer. Pt declined all 4 current bed offers and states that he would rather go home with [facility]. Pt states that wife can assist with care. Medical team updated about pt's current discharge plan.  TOC team will continue to assist with discharge planning needs.        Expected Discharge Plan and Services         Expected Discharge Date: 11/30/23                                     Social Determinants of Health (SDOH) Interventions SDOH Screenings   Food Insecurity: No Food Insecurity (11/29/2023)  Housing: Low Risk  (11/29/2023)  Transportation Needs: No Transportation Needs (11/29/2023)  Utilities: Not At Risk (11/29/2023)  Depression (PHQ2-9): High Risk (02/11/2023)  Financial Resource Strain: Low Risk  (01/04/2023)  Physical Activity: Inactive (01/04/2023)  Social Connections: Moderately Integrated (11/29/2023)  Stress: No Stress Concern Present (01/04/2023)  Tobacco Use: Low Risk  (11/28/2023)    Readmission Risk Interventions    10/20/2023   11:54 AM  Readmission Risk Prevention Plan  Transportation Screening Complete  PCP or Specialist Appt within 5-7 Days Complete  Home Care Screening Complete  Medication Review (RN CM) Complete

## 2023-12-04 NOTE — TOC Progression Note (Signed)
 Transition of Care [facility]) - Progression Note    Patient Details  Name: Wayne Booth MRN: [id number] Date of Birth: 1941-02-22  Transition of Care [facility]) CM/SW Contact  Jarrell Merritts, LCSW Phone Number: 12/04/2023, 11:52 AM  Clinical Narrative:     CSW was alerted that pt's son is requesting a phone call about pt's status of discharge. CSW spoke with pt's sone Ronnie and alerted him that pt had declined all bed offers and is amenable to going home.CSW also informed Kendra Pavy that pt is ambulating a 170 ft as well. CSW highlighted that pt has been set up with [facility] for [facility]. Ronnie asked if pt is being discharged today that he could come pick him up.  TOC team will continue to assist with discharge planning needs.        Expected Discharge Plan and Services         Expected Discharge Date: 11/30/23                                     Social Determinants of Health (SDOH) Interventions SDOH Screenings   Food Insecurity: No Food Insecurity (11/29/2023)  Housing: Low Risk  (11/29/2023)  Transportation Needs: No Transportation Needs (11/29/2023)  Utilities: Not At Risk (11/29/2023)  Depression (PHQ2-9): High Risk (02/11/2023)  Financial Resource Strain: Low Risk  (01/04/2023)  Physical Activity: Inactive (01/04/2023)  Social Connections: Moderately Integrated (11/29/2023)  Stress: No Stress Concern Present (01/04/2023)  Tobacco Use: Low Risk  (11/28/2023)    Readmission Risk Interventions    10/20/2023   11:54 AM  Readmission Risk Prevention Plan  Transportation Screening Complete  PCP or Specialist Appt within 5-7 Days Complete  Home Care Screening Complete  Medication Review (RN CM) Complete

## 2023-12-07 DIAGNOSIS — J849 Interstitial pulmonary disease, unspecified: Secondary | ICD-10-CM | POA: Diagnosis not present

## 2023-12-07 DIAGNOSIS — J449 Chronic obstructive pulmonary disease, unspecified: Secondary | ICD-10-CM | POA: Diagnosis not present

## 2023-12-07 DIAGNOSIS — I4891 Unspecified atrial fibrillation: Secondary | ICD-10-CM | POA: Diagnosis not present

## 2023-12-07 DIAGNOSIS — J9611 Chronic respiratory failure with hypoxia: Secondary | ICD-10-CM | POA: Diagnosis not present

## 2023-12-10 ENCOUNTER — Inpatient Hospital Stay: Admitting: Family Medicine

## 2023-12-10 DIAGNOSIS — I4891 Unspecified atrial fibrillation: Secondary | ICD-10-CM | POA: Diagnosis not present

## 2023-12-10 DIAGNOSIS — J849 Interstitial pulmonary disease, unspecified: Secondary | ICD-10-CM | POA: Diagnosis not present

## 2023-12-10 DIAGNOSIS — J449 Chronic obstructive pulmonary disease, unspecified: Secondary | ICD-10-CM | POA: Diagnosis not present

## 2023-12-10 DIAGNOSIS — J9611 Chronic respiratory failure with hypoxia: Secondary | ICD-10-CM | POA: Diagnosis not present

## 2023-12-14 DIAGNOSIS — J849 Interstitial pulmonary disease, unspecified: Secondary | ICD-10-CM | POA: Diagnosis not present

## 2023-12-14 DIAGNOSIS — I4891 Unspecified atrial fibrillation: Secondary | ICD-10-CM | POA: Diagnosis not present

## 2023-12-14 DIAGNOSIS — J9611 Chronic respiratory failure with hypoxia: Secondary | ICD-10-CM | POA: Diagnosis not present

## 2023-12-14 DIAGNOSIS — J449 Chronic obstructive pulmonary disease, unspecified: Secondary | ICD-10-CM | POA: Diagnosis not present

## 2023-12-15 ENCOUNTER — Other Ambulatory Visit: Payer: Self-pay | Admitting: Family Medicine

## 2023-12-16 DIAGNOSIS — J9611 Chronic respiratory failure with hypoxia: Secondary | ICD-10-CM | POA: Diagnosis not present

## 2023-12-16 DIAGNOSIS — J449 Chronic obstructive pulmonary disease, unspecified: Secondary | ICD-10-CM | POA: Diagnosis not present

## 2023-12-16 DIAGNOSIS — I4891 Unspecified atrial fibrillation: Secondary | ICD-10-CM | POA: Diagnosis not present

## 2023-12-16 DIAGNOSIS — J849 Interstitial pulmonary disease, unspecified: Secondary | ICD-10-CM | POA: Diagnosis not present

## 2023-12-20 ENCOUNTER — Emergency Department (HOSPITAL_COMMUNITY)

## 2023-12-20 ENCOUNTER — Inpatient Hospital Stay (HOSPITAL_COMMUNITY)
Admission: EM | Admit: 2023-12-20 | Discharge: 2023-12-24 | DRG: 291 | Disposition: A | Discharge status: Home Health | Attending: Internal Medicine | Admitting: Internal Medicine

## 2023-12-20 ENCOUNTER — Other Ambulatory Visit: Payer: Self-pay

## 2023-12-20 ENCOUNTER — Encounter (HOSPITAL_COMMUNITY): Payer: Self-pay | Admitting: Internal Medicine

## 2023-12-20 DIAGNOSIS — M109 Gout, unspecified: Secondary | ICD-10-CM | POA: Diagnosis present

## 2023-12-20 DIAGNOSIS — J9611 Chronic respiratory failure with hypoxia: Secondary | ICD-10-CM | POA: Diagnosis present

## 2023-12-20 DIAGNOSIS — I2510 Atherosclerotic heart disease of native coronary artery without angina pectoris: Secondary | ICD-10-CM | POA: Diagnosis present

## 2023-12-20 DIAGNOSIS — I4892 Unspecified atrial flutter: Secondary | ICD-10-CM | POA: Diagnosis not present

## 2023-12-20 DIAGNOSIS — E66811 Obesity, class 1: Secondary | ICD-10-CM | POA: Diagnosis present

## 2023-12-20 DIAGNOSIS — E039 Hypothyroidism, unspecified: Secondary | ICD-10-CM | POA: Diagnosis present

## 2023-12-20 DIAGNOSIS — E871 Hypo-osmolality and hyponatremia: Secondary | ICD-10-CM | POA: Diagnosis not present

## 2023-12-20 DIAGNOSIS — Z7951 Long term (current) use of inhaled steroids: Secondary | ICD-10-CM

## 2023-12-20 DIAGNOSIS — G894 Chronic pain syndrome: Secondary | ICD-10-CM | POA: Diagnosis present

## 2023-12-20 DIAGNOSIS — N179 Acute kidney failure, unspecified: Secondary | ICD-10-CM | POA: Diagnosis not present

## 2023-12-20 DIAGNOSIS — J449 Chronic obstructive pulmonary disease, unspecified: Secondary | ICD-10-CM | POA: Diagnosis present

## 2023-12-20 DIAGNOSIS — G4733 Obstructive sleep apnea (adult) (pediatric): Secondary | ICD-10-CM | POA: Diagnosis not present

## 2023-12-20 DIAGNOSIS — Z6832 Body mass index (BMI) 32.0-32.9, adult: Secondary | ICD-10-CM

## 2023-12-20 DIAGNOSIS — N183 Chronic kidney disease, stage 3 unspecified: Secondary | ICD-10-CM | POA: Diagnosis not present

## 2023-12-20 DIAGNOSIS — M10079 Idiopathic gout, unspecified ankle and foot: Secondary | ICD-10-CM | POA: Diagnosis not present

## 2023-12-20 DIAGNOSIS — G629 Polyneuropathy, unspecified: Secondary | ICD-10-CM | POA: Diagnosis not present

## 2023-12-20 DIAGNOSIS — I2782 Chronic pulmonary embolism: Secondary | ICD-10-CM | POA: Diagnosis not present

## 2023-12-20 DIAGNOSIS — Z96653 Presence of artificial knee joint, bilateral: Secondary | ICD-10-CM | POA: Diagnosis present

## 2023-12-20 DIAGNOSIS — J8410 Pulmonary fibrosis, unspecified: Secondary | ICD-10-CM | POA: Diagnosis present

## 2023-12-20 DIAGNOSIS — Z905 Acquired absence of kidney: Secondary | ICD-10-CM

## 2023-12-20 DIAGNOSIS — I10 Essential (primary) hypertension: Secondary | ICD-10-CM | POA: Diagnosis present

## 2023-12-20 DIAGNOSIS — Z9981 Dependence on supplemental oxygen: Secondary | ICD-10-CM | POA: Diagnosis not present

## 2023-12-20 DIAGNOSIS — I5031 Acute diastolic (congestive) heart failure: Secondary | ICD-10-CM | POA: Diagnosis not present

## 2023-12-20 DIAGNOSIS — Z515 Encounter for palliative care: Secondary | ICD-10-CM

## 2023-12-20 DIAGNOSIS — R918 Other nonspecific abnormal finding of lung field: Secondary | ICD-10-CM | POA: Diagnosis not present

## 2023-12-20 DIAGNOSIS — I5033 Acute on chronic diastolic (congestive) heart failure: Secondary | ICD-10-CM | POA: Diagnosis not present

## 2023-12-20 DIAGNOSIS — Z1152 Encounter for screening for COVID-19: Secondary | ICD-10-CM | POA: Diagnosis not present

## 2023-12-20 DIAGNOSIS — R609 Edema, unspecified: Secondary | ICD-10-CM | POA: Diagnosis not present

## 2023-12-20 DIAGNOSIS — I130 Hypertensive heart and chronic kidney disease with heart failure and stage 1 through stage 4 chronic kidney disease, or unspecified chronic kidney disease: Principal | ICD-10-CM | POA: Diagnosis present

## 2023-12-20 DIAGNOSIS — Z7189 Other specified counseling: Secondary | ICD-10-CM | POA: Diagnosis not present

## 2023-12-20 DIAGNOSIS — Z85528 Personal history of other malignant neoplasm of kidney: Secondary | ICD-10-CM

## 2023-12-20 DIAGNOSIS — Z87442 Personal history of urinary calculi: Secondary | ICD-10-CM

## 2023-12-20 DIAGNOSIS — Z8051 Family history of malignant neoplasm of kidney: Secondary | ICD-10-CM

## 2023-12-20 DIAGNOSIS — Z9049 Acquired absence of other specified parts of digestive tract: Secondary | ICD-10-CM

## 2023-12-20 DIAGNOSIS — J90 Pleural effusion, not elsewhere classified: Secondary | ICD-10-CM | POA: Diagnosis not present

## 2023-12-20 DIAGNOSIS — U099 Post covid-19 condition, unspecified: Secondary | ICD-10-CM | POA: Diagnosis present

## 2023-12-20 DIAGNOSIS — Z91148 Patient's other noncompliance with medication regimen for other reason: Secondary | ICD-10-CM

## 2023-12-20 DIAGNOSIS — F411 Generalized anxiety disorder: Secondary | ICD-10-CM | POA: Diagnosis not present

## 2023-12-20 DIAGNOSIS — N182 Chronic kidney disease, stage 2 (mild): Secondary | ICD-10-CM | POA: Diagnosis not present

## 2023-12-20 DIAGNOSIS — E662 Morbid (severe) obesity with alveolar hypoventilation: Secondary | ICD-10-CM | POA: Diagnosis present

## 2023-12-20 DIAGNOSIS — I509 Heart failure, unspecified: Principal | ICD-10-CM

## 2023-12-20 DIAGNOSIS — Z8709 Personal history of other diseases of the respiratory system: Secondary | ICD-10-CM

## 2023-12-20 DIAGNOSIS — R0989 Other specified symptoms and signs involving the circulatory and respiratory systems: Secondary | ICD-10-CM | POA: Diagnosis not present

## 2023-12-20 DIAGNOSIS — Z800 Family history of malignant neoplasm of digestive organs: Secondary | ICD-10-CM

## 2023-12-20 DIAGNOSIS — I110 Hypertensive heart disease with heart failure: Secondary | ICD-10-CM | POA: Diagnosis not present

## 2023-12-20 DIAGNOSIS — I480 Paroxysmal atrial fibrillation: Secondary | ICD-10-CM | POA: Diagnosis present

## 2023-12-20 DIAGNOSIS — Z888 Allergy status to other drugs, medicaments and biological substances status: Secondary | ICD-10-CM

## 2023-12-20 DIAGNOSIS — R0602 Shortness of breath: Secondary | ICD-10-CM | POA: Diagnosis not present

## 2023-12-20 DIAGNOSIS — Z8546 Personal history of malignant neoplasm of prostate: Secondary | ICD-10-CM

## 2023-12-20 DIAGNOSIS — Z79899 Other long term (current) drug therapy: Secondary | ICD-10-CM

## 2023-12-20 DIAGNOSIS — Z981 Arthrodesis status: Secondary | ICD-10-CM

## 2023-12-20 LAB — I-STAT CHEM 8, ED
BUN: 10 mg/dL (ref 8–23)
Calcium, Ion: 1.12 mmol/L — ABNORMAL LOW (ref 1.15–1.40)
Chloride: 101 mmol/L (ref 98–111)
Creatinine, Ser: 0.9 mg/dL (ref 0.61–1.24)
Glucose, Bld: 86 mg/dL (ref 70–99)
HCT: 43 % (ref 39.0–52.0)
Hemoglobin: 14.6 g/dL (ref 13.0–17.0)
Potassium: 4 mmol/L (ref 3.5–5.1)
Sodium: 141 mmol/L (ref 135–145)
TCO2: 29 mmol/L (ref 22–32)

## 2023-12-20 LAB — TROPONIN I (HIGH SENSITIVITY): Troponin I (High Sensitivity): 5 ng/L (ref <18)

## 2023-12-20 LAB — CBC WITH DIFFERENTIAL/PLATELET
Abs Immature Granulocytes: 0.03 10*3/uL (ref 0.00–0.07)
Basophils Absolute: 0 10*3/uL (ref 0.0–0.1)
Basophils Relative: 0 %
Eosinophils Absolute: 0.2 10*3/uL (ref 0.0–0.5)
Eosinophils Relative: 3 %
HCT: 42.4 % (ref 39.0–52.0)
Hemoglobin: 13.8 g/dL (ref 13.0–17.0)
Immature Granulocytes: 1 %
Lymphocytes Relative: 13 %
Lymphs Abs: 0.9 10*3/uL (ref 0.7–4.0)
MCH: 29.9 pg (ref 26.0–34.0)
MCHC: 32.5 g/dL (ref 30.0–36.0)
MCV: 92 fL (ref 80.0–100.0)
Monocytes Absolute: 0.6 10*3/uL (ref 0.1–1.0)
Monocytes Relative: 9 %
Neutro Abs: 4.9 10*3/uL (ref 1.7–7.7)
Neutrophils Relative %: 74 %
Platelets: 352 10*3/uL (ref 150–400)
RBC: 4.61 MIL/uL (ref 4.22–5.81)
RDW: 14.2 % (ref 11.5–15.5)
WBC: 6.6 10*3/uL (ref 4.0–10.5)
nRBC: 0 % (ref 0.0–0.2)

## 2023-12-20 LAB — I-STAT VENOUS BLOOD GAS, ED
Acid-Base Excess: 5 mmol/L — ABNORMAL HIGH (ref 0.0–2.0)
Bicarbonate: 31.2 mmol/L — ABNORMAL HIGH (ref 20.0–28.0)
Calcium, Ion: 1.12 mmol/L — ABNORMAL LOW (ref 1.15–1.40)
HCT: 41 % (ref 39.0–52.0)
Hemoglobin: 13.9 g/dL (ref 13.0–17.0)
O2 Saturation: 96 %
Potassium: 4 mmol/L (ref 3.5–5.1)
Sodium: 141 mmol/L (ref 135–145)
TCO2: 33 mmol/L — ABNORMAL HIGH (ref 22–32)
pCO2, Ven: 48.9 mmHg (ref 44–60)
pH, Ven: 7.413 (ref 7.25–7.43)
pO2, Ven: 81 mmHg — ABNORMAL HIGH (ref 32–45)

## 2023-12-20 LAB — COMPREHENSIVE METABOLIC PANEL WITH GFR
ALT: 22 U/L (ref 0–44)
AST: 29 U/L (ref 15–41)
Albumin: 2.6 g/dL — ABNORMAL LOW (ref 3.5–5.0)
Alkaline Phosphatase: 54 U/L (ref 38–126)
Anion gap: 6 (ref 5–15)
BUN: 10 mg/dL (ref 8–23)
CO2: 27 mmol/L (ref 22–32)
Calcium: 8 mg/dL — ABNORMAL LOW (ref 8.9–10.3)
Chloride: 104 mmol/L (ref 98–111)
Creatinine, Ser: 0.85 mg/dL (ref 0.61–1.24)
GFR, Estimated: >60 mL/min (ref >60)
Glucose, Bld: 84 mg/dL (ref 70–99)
Potassium: 4.5 mmol/L (ref 3.5–5.1)
Sodium: 137 mmol/L (ref 135–145)
Total Bilirubin: 1 mg/dL (ref 0.0–1.2)
Total Protein: 7.1 g/dL (ref 6.5–8.1)

## 2023-12-20 LAB — RESP PANEL BY RT-PCR (RSV, FLU A&B, COVID)  RVPGX2
Influenza A by PCR: NEGATIVE
Influenza B by PCR: NEGATIVE
Resp Syncytial Virus by PCR: NEGATIVE
SARS Coronavirus 2 by RT PCR: NEGATIVE

## 2023-12-20 MED ORDER — SPIRONOLACTONE 12.5 MG HALF TABLET
12.5000 mg | ORAL_TABLET | Freq: Every day | ORAL | Status: DC
  Administered 2023-12-21: 12.5 mg via ORAL
  Filled 2023-12-20: qty 1

## 2023-12-20 MED ORDER — FUROSEMIDE 10 MG/ML IJ SOLN
40.0000 mg | Freq: Once | INTRAMUSCULAR | Status: AC
  Administered 2023-12-20: 40 mg via INTRAVENOUS
  Filled 2023-12-20: qty 4

## 2023-12-20 MED ORDER — ACETAMINOPHEN 325 MG PO TABS: 650.0000 mg | ORAL_TABLET | Freq: Four times a day (QID) | ORAL | Status: DC | PRN

## 2023-12-20 MED ORDER — ONDANSETRON HCL 4 MG/2ML IJ SOLN
4.0000 mg | Freq: Four times a day (QID) | INTRAMUSCULAR | Status: DC | PRN
Start: 2023-12-20 — End: 2023-12-24
  Administered 2023-12-22 (×2): 4 mg via INTRAVENOUS
  Filled 2023-12-20 (×2): qty 2

## 2023-12-20 MED ORDER — ONDANSETRON HCL 4 MG PO TABS: 4.0000 mg | ORAL_TABLET | Freq: Four times a day (QID) | ORAL | Status: DC | PRN

## 2023-12-20 MED ORDER — SODIUM CHLORIDE 0.9% FLUSH
3.0000 mL | Freq: Two times a day (BID) | INTRAVENOUS | Status: DC
  Administered 2023-12-21 – 2023-12-24 (×7): 3 mL via INTRAVENOUS

## 2023-12-20 MED ORDER — SODIUM CHLORIDE 0.9% FLUSH: 3.0000 mL | INTRAVENOUS | Status: DC | PRN

## 2023-12-20 MED ORDER — HYDROCODONE-ACETAMINOPHEN 5-325 MG PO TABS: 1.0000 | ORAL_TABLET | ORAL | Status: DC | PRN

## 2023-12-20 MED ORDER — SODIUM CHLORIDE 0.9% FLUSH
3.0000 mL | Freq: Two times a day (BID) | INTRAVENOUS | Status: DC
  Administered 2023-12-21 – 2023-12-24 (×6): 3 mL via INTRAVENOUS

## 2023-12-20 MED ORDER — BUDESONIDE 0.25 MG/2ML IN SUSP
0.2500 mg | Freq: Two times a day (BID) | RESPIRATORY_TRACT | Status: DC
  Administered 2023-12-21 – 2023-12-24 (×7): 0.25 mg via RESPIRATORY_TRACT
  Filled 2023-12-20 (×7): qty 2

## 2023-12-20 MED ORDER — GABAPENTIN 300 MG PO CAPS: 300.0000 mg | ORAL_CAPSULE | Freq: Three times a day (TID) | ORAL | Status: DC

## 2023-12-20 MED ORDER — ESCITALOPRAM OXALATE 10 MG PO TABS
20.0000 mg | ORAL_TABLET | Freq: Every day | ORAL | Status: DC
  Administered 2023-12-21 – 2023-12-24 (×4): 20 mg via ORAL
  Filled 2023-12-20 (×4): qty 2

## 2023-12-20 MED ORDER — SODIUM CHLORIDE 0.9 % IV SOLN: 250.0000 mL | INTRAVENOUS | Status: AC | PRN

## 2023-12-20 MED ORDER — FUROSEMIDE 10 MG/ML IJ SOLN
40.0000 mg | Freq: Two times a day (BID) | INTRAMUSCULAR | Status: DC
  Administered 2023-12-21 – 2023-12-22 (×3): 40 mg via INTRAVENOUS
  Filled 2023-12-20 (×3): qty 4

## 2023-12-20 MED ORDER — ACETAMINOPHEN 650 MG RE SUPP
650.0000 mg | Freq: Four times a day (QID) | RECTAL | Status: DC | PRN
Start: 2023-12-20 — End: 2023-12-24

## 2023-12-20 MED ORDER — IPRATROPIUM-ALBUTEROL 0.5-2.5 (3) MG/3ML IN SOLN: 3.0000 mL | Freq: Four times a day (QID) | RESPIRATORY_TRACT | Status: DC | PRN

## 2023-12-20 MED ORDER — GABAPENTIN 300 MG PO CAPS
300.0000 mg | ORAL_CAPSULE | Freq: Three times a day (TID) | ORAL | Status: DC
  Administered 2023-12-21: 300 mg via ORAL
  Filled 2023-12-20: qty 1

## 2023-12-20 MED ORDER — HEPARIN SODIUM (PORCINE) 5000 UNIT/ML IJ SOLN
5000.0000 [IU] | Freq: Three times a day (TID) | INTRAMUSCULAR | Status: DC
  Administered 2023-12-21 – 2023-12-24 (×10): 5000 [IU] via SUBCUTANEOUS
  Filled 2023-12-20 (×10): qty 1

## 2023-12-20 NOTE — H&P (Signed)
 History and Physical    Wayne Booth FMW:[id number] DOB: 1941-04-16 DOA: 12/20/2023  PCP: Katrinka Garnette KIDD, MD   Patient coming from: Home   Chief Complaint:  Chief Complaint  Patient presents with   Shortness of Breath   ED TRIAGE note:Pt BIB [facility] from home. Pt c/o SOB x2days; w/ sudden onset on nausea tonight. Denies CP. Pt reports he had recent medication change and was taking his Lasix  PRN instead of daily. 5L [place] at baseline; arrived to ED at 96% On CPAP.   HPI:  Wayne Booth is a 83 y.o. male with medical history significant of chronic diastolic heart failure preserved EF 65 to 70%, chronic hypoxic respiratory failure 5 L oxygen  at baseline, COVID associated pulmonary fibrosis, history paroxysmal atrial fibrillation not on anticoagulation-patient refused anticoagulation in the past, COPD, morbid obesity, OSA and chronic physical debility presented emergency department complaining of shortness of breath for 2 days and associated nausea. Patient reported he recently has been taking Lasix  as needed.  ED Course:  At presentation to ED patient has been placed on BiPAP due to respiratory distress.  Tachypneic 33 otherwise hemodynamically stable. Respiratory panel negative for COVID, RSV for flu. CBC unremarkable. CMP unremarkable. Troponin 5 within normal range. VBG unremarkable. Pending BNP level.  EKG showed normal sinus rhythm heart rate 77, normal QTc and there is no history of abnormality.  Chest x-ray showed cardiomegaly with pulmonary vascular congestion,Low lung volumes with bilateral lower lobe airspace opacities which could reflect edema or infection. Small bilateral effusions.   In the ED patient has been given Lasix  40 mg IV.  Hospitalist has been consulted for management of acute on chronic CHF exacerbation.  Significant labs in the ED: Lab Orders         Resp panel by RT-PCR (RSV, Flu A&B, Covid) Anterior Nasal Swab         CBC with Differential          Comprehensive metabolic panel         Brain natriuretic peptide         CBC         Basic metabolic panel         I-stat chem 8, ED (not at [facility], [facility] or [facility])         I-Stat venous blood gas, ([facility] ED, [facility], [facility])       Review of Systems:  Review of Systems  Constitutional:  Negative for chills, fever, malaise/fatigue and weight loss.  Respiratory:  Negative for cough, sputum production, shortness of breath and wheezing.   Cardiovascular:  Positive for orthopnea, leg swelling and PND. Negative for chest pain and palpitations.  Gastrointestinal:  Negative for abdominal pain, diarrhea, heartburn, nausea and vomiting.  Musculoskeletal:  Negative for myalgias.  Neurological:  Negative for dizziness.  Psychiatric/Behavioral:  The patient is not nervous/anxious.     Past Medical History:  Diagnosis Date   Allergy    States his nose runs when he is around grease.    Anxiety    Arthritis    Atrial fibrillation (HCC)    Cancer (HCC)    Coronary artery disease    History of total knee replacement    Hypertension    Hypothyroidism    Kidney cancer, primary, with metastasis from kidney to other site [facility])    Kidney stone    OSA (obstructive sleep apnea)    pt does not wear cpap at night   Pituitary cyst (HCC)  Pneumonia    hx of   Prostate cancer [facility])     Past Surgical History:  Procedure Laterality Date   ABLATION OF DYSRHYTHMIC FOCUS  01/28/2018   ANTERIOR CERVICAL DECOMP/DISCECTOMY FUSION N/A 08/29/2012   Procedure: ANTERIOR CERVICAL DECOMPRESSION/DISCECTOMY FUSION 1 LEVEL;  Surgeon: Alm GORMAN Molt, MD;  Location: [facility] NEURO ORS;  Service: Neurosurgery;  Laterality: N/A;   APPENDECTOMY     ATRIAL FIBRILLATION ABLATION N/A 01/28/2018   Procedure: ATRIAL FIBRILLATION ABLATION;  Surgeon: Inocencio Soyla Lunger, MD;  Location: [facility] INVASIVE CV LAB;  Service: Cardiovascular;  Laterality: N/A;   CARDIAC CATHETERIZATION  2008   clean   CHOLECYSTECTOMY     COLONOSCOPY W/ POLYPECTOMY      CRANIOTOMY N/A 11/08/2012   Procedure: CRANIOTOMY HYPOPHYSECTOMY TRANSNASAL APPROACH;  Surgeon: Darina MALVA Boehringer, MD;  Location: [facility] NEURO ORS;  Service: Neurosurgery;  Laterality: N/A;  Transphenoidal Resection of Pituitary Tumor   FINGER SURGERY Left 2017   Dr Camella   INSERTION PROSTATE RADIATION SEED     IR THORACENTESIS ASP PLEURAL SPACE W/IMG GUIDE  04/15/2018   KIDNEY STONE SURGERY     KNEE ARTHROSCOPY  2007   left   NEPHRECTOMY Left    POSTERIOR CERVICAL LAMINECTOMY Left 04/24/2015   Procedure: Foraminotomy cervical five - cervical six cervical six - cervical seven left;  Surgeon: Alm GORMAN Molt, MD;  Location: [facility] NEURO ORS;  Service: Neurosurgery;  Laterality: Left;   REPLACEMENT TOTAL KNEE BILATERAL     RIGHT/LEFT HEART CATH AND CORONARY ANGIOGRAPHY N/A 07/14/2019   Procedure: RIGHT/LEFT HEART CATH AND CORONARY ANGIOGRAPHY;  Surgeon: Dann Candyce GORMAN, MD;  Location: [facility] INVASIVE CV LAB;  Service: Cardiovascular;  Laterality: N/A;     reports that he has never smoked. He has never used smokeless tobacco. He reports current alcohol  use of about 2.0 standard drinks of alcohol  per week. He reports that he does not use drugs.  Allergies  Allergen Reactions   Ambien  [Zolpidem ] Anxiety and Other (See Comments)    Anxiety and agitation when tried as sleeper in hospital aug 2019    Family History  Problem Relation Age of Onset   Cancer Mother        stomach, died when he was young   Cancer Father        ?mouth, died before patient born   Kidney cancer Sister    Colon cancer Brother 59   Gastric cancer Son 56       stomach cancer per pt   Esophageal cancer Neg Hx    Rectal cancer Neg Hx    Stomach cancer Neg Hx     Prior to Admission medications   Medication Sig Start Date End Date Taking? Authorizing Provider  albuterol  (PROVENTIL ) (2.5 MG/3ML) 0.083% nebulizer solution Take 3 mLs (2.5 mg total) by nebulization every 6 (six) hours as needed for wheezing or shortness of  breath. 10/12/22   Parrett, Madelin GORMAN, NP  albuterol  (VENTOLIN  HFA) 108 (90 Base) MCG/ACT inhaler INHALE 2 PUFFS BY MOUTH EVERY 6 HOURS AS NEEDED FOR WHEEZING OR SHORTNESS OF BREATH Patient taking differently: Inhale 2 puffs into the lungs every 6 (six) hours as needed for wheezing or shortness of breath. 07/13/23   Parrett, Madelin GORMAN, NP  budesonide  (PULMICORT ) 0.25 MG/2ML nebulizer solution Take 2 mLs (0.25 mg total) by nebulization 2 (two) times daily. Patient taking differently: Take 0.25 mg by nebulization as needed (wheezing/sob). 09/07/23   Jillian Buttery, MD  busPIRone  (BUSPAR ) 5 MG tablet Take  5 mg by mouth daily as needed (anxiety). 11/03/23   [provider]  escitalopram  (LEXAPRO ) 20 MG tablet TAKE 1 TABLET BY MOUTH DAILY 06/07/23   Katrinka Garnette KIDD, MD  furosemide  (LASIX ) 20 MG tablet Take 2 tablets (40 mg total) by mouth 2 (two) times daily. 12/04/23   Darci Pore, MD  gabapentin  (NEURONTIN ) 300 MG capsule Take 1 capsule (300 mg total) by mouth 3 (three) times daily. 12/25/22   Katrinka Garnette KIDD, MD  ipratropium-albuterol  (DUONEB) 0.5-2.5 (3) MG/3ML SOLN Take 3 mLs by nebulization every 6 (six) hours as needed. Patient taking differently: Take 3 mLs by nebulization every 6 (six) hours as needed (wheezing or shortness of breath). 09/07/23   Jillian Buttery, MD  LORazepam  (ATIVAN ) 0.5 MG tablet Take 1 tablet (0.5 mg total) by mouth daily as needed for anxiety (do not take within 8 hours of bed. do not drive for 8 hours after taking.). Patient taking differently: Take 0.5 mg by mouth daily as needed for anxiety. 04/30/23   Katrinka Garnette KIDD, MD  ondansetron  (ZOFRAN ) 4 MG tablet Take 1 tablet (4 mg total) by mouth every 6 (six) hours as needed for nausea. 09/17/23   Will Almarie MATSU, MD  OXYGEN  Inhale 5 L into the lungs continuous.    [provider]  senna-docusate (SENOKOT-S) 8.6-50 MG tablet Take 2 tablets by mouth 2 (two) times daily. 12/04/23   Darci Pore, MD   traMADol  (ULTRAM ) 50 MG tablet Take 1 tablet (50 mg total) by mouth every 6 (six) hours as needed (prefer max 3 per day). Patient taking differently: Take 50 mg by mouth daily as needed for moderate pain (pain score 4-6) or severe pain (pain score 7-10) (prefer max 3 per day). 01/30/22   Kennyth Worth HERO, MD  traZODone  (DESYREL ) 50 MG tablet Take 1 tablet (50 mg total) by mouth at bedtime. 12/03/23   Katrinka Garnette KIDD, MD     Physical Exam: Vitals:   12/20/23 2043 12/20/23 2045 12/20/23 2215 12/20/23 2314  BP:   (!) 140/82   Pulse:  79 72 70  Resp:  (!) 31 (!) 27 (!) 28  Temp:      TempSrc:      SpO2:  100% 100% 100%  Weight: 105 kg     Height: 5' 11 (1.803 m)       Physical Exam Constitutional:      Appearance: He is obese. He is not ill-appearing.   Cardiovascular:     Rate and Rhythm: Normal rate and regular rhythm.  Pulmonary:     Effort: Pulmonary effort is normal.     Breath sounds: Normal breath sounds. No decreased breath sounds, wheezing, rhonchi or rales.   Musculoskeletal:     Right lower leg: Edema present.     Left lower leg: Edema present.   Skin:    Capillary Refill: Capillary refill takes less than 2 seconds.   Neurological:     Mental Status: He is alert and oriented to person, place, and time.   Psychiatric:        Mood and Affect: Mood normal.      Labs on Admission: I have personally reviewed following labs and imaging studies  CBC: Recent Labs  Lab 12/20/23 2048 12/20/23 2103 12/20/23 2104  WBC 6.6  --   --   NEUTROABS 4.9  --   --   HGB 13.8 13.9 14.6  HCT 42.4 41.0 43.0  MCV 92.0  --   --  PLT 352  --   --    Basic Metabolic Panel: Recent Labs  Lab 12/20/23 2048 12/20/23 2103 12/20/23 2104  NA 137 141 141  K 4.5 4.0 4.0  CL 104  --  101  CO2 27  --   --   GLUCOSE 84  --  86  BUN 10  --  10  CREATININE 0.85  --  0.90  CALCIUM 8.0*  --   --    GFR: Estimated Creatinine Clearance: 79.4 mL/min (by C-G formula based on SCr of  0.9 mg/dL). Liver Function Tests: Recent Labs  Lab 12/20/23 2048  AST 29  ALT 22  ALKPHOS 54  BILITOT 1.0  PROT 7.1  ALBUMIN  2.6*   No results for input(s): LIPASE, AMYLASE in the last 168 hours. No results for input(s): AMMONIA in the last 168 hours. Coagulation Profile: No results for input(s): INR, PROTIME in the last 168 hours. Cardiac Enzymes: Recent Labs  Lab 12/20/23 2048  TROPONINIHS 5   BNP (last 3 results) Recent Labs    09/13/23 0515 10/19/23 1528 11/28/23 1611  BNP 59.9 119.8* 65.1   HbA1C: No results for input(s): HGBA1C in the last 72 hours. CBG: No results for input(s): GLUCAP in the last 168 hours. Lipid Profile: No results for input(s): CHOL, HDL, LDLCALC, TRIG, CHOLHDL, LDLDIRECT in the last 72 hours. Thyroid  Function Tests: No results for input(s): TSH, T4TOTAL, FREET4, T3FREE, THYROIDAB in the last 72 hours. Anemia Panel: No results for input(s): VITAMINB12, FOLATE, FERRITIN, TIBC, IRON, RETICCTPCT in the last 72 hours. Urine analysis:    Component Value Date/Time   COLORURINE AMBER (A) 04/20/2019 1438   APPEARANCEUR CLEAR 04/20/2019 1438   LABSPEC 1.017 04/20/2019 1438   PHURINE 5.0 04/20/2019 1438   GLUCOSEU NEGATIVE 04/20/2019 1438   HGBUR MODERATE (A) 04/20/2019 1438   HGBUR 2+ 09/22/2007 1444   BILIRUBINUR NEGATIVE 04/20/2019 1438   BILIRUBINUR n 11/07/2015 1417   KETONESUR NEGATIVE 04/20/2019 1438   PROTEINUR NEGATIVE 04/20/2019 1438   UROBILINOGEN 1.0 11/07/2015 1417   UROBILINOGEN 1.0 05/22/2011 1454   NITRITE NEGATIVE 04/20/2019 1438   LEUKOCYTESUR NEGATIVE 04/20/2019 1438    Radiological Exams on Admission: I have personally reviewed images DG Chest Port 1 View Result Date: 12/20/2023 CLINICAL DATA:  Shortness of breath EXAM: PORTABLE CHEST 1 VIEW COMPARISON:  11/28/2023 FINDINGS: Cardiomegaly, vascular congestion. Low lung volumes. Small bilateral pleural effusions again noted,  unchanged. Patchy bilateral lower lobe airspace opacities. No acute bony abnormality. IMPRESSION: Cardiomegaly, vascular congestion. Low lung volumes with bilateral lower lobe airspace opacities which could reflect edema or infection. Small bilateral effusions. Electronically Signed   By: Franky Crease M.D.   On: 12/20/2023 21:15     EKG: My personal interpretation of EKG shows: EKG showed normal sinus rhythm heart rate 77    Assessment/Plan: Principal Problem:   Acute diastolic heart failure (HCC) Active Problems:   Acute on chronic diastolic CHF (congestive heart failure) (HCC)   Essential hypertension   Obstructive sleep apnea   GAD (generalized anxiety disorder)   Chronic pain syndrome   Chronic hypoxic respiratory failure (HCC)   Paroxysmal atrial fibrillation (HCC)   History of COPD    Assessment and Plan: Acute on chronic diastolic heart failure exacerbation Respiratory distress-secondary to CHF exacerbation -Present emergency department complaining of orthopnea, dyspnea, PND and bilateral lower extremity swelling.  Has been taking Lasix  20 mg intermittently not following salt restriction and fluid restriction.  Also does not like to use CPAP. -  At presentation to ED due to respiratory distress patient has been placed on BiPAP tolerating well.  O2 sat 100%. - Hemodynamically stable - Pending BNP. - CBC and CMP unremarkable.  Respiratory bilateral no evidence of COVID, RSV and flu.  Normal VBG. - Troponin within normal range.  EKG showing sinus rhythm heart rate 77. - Chest x-ray showing pulmonary vascular congestion. - In the ED patient has been given Lasix  40 mg IV. - Continue IV Lasix  40 mg twice daily, strict I's/, daily weight, monitor urine output.  Obtaining echocardiogram. - At home patient is not any blood pressure regimen except Lasix . - Given history of chronic diastolic heart failure starting spironolactone 12.5 mg daily.  If can tolerate blood pressure would be  benefit from ACE inhibitor/ARB given history of CKD. - Continue cardiac monitoring. - Continue BiPAP overnight and will transition to nasal cannula oxygen  5 L which is patient's baseline.  Chronic hypoxic respiratory failure 5 L oxygen  at baseline History of COPD History of COVID associated pulmonary fibrosis Chronic hypoxic respiratory failure 5 L oxygen  at baseline. - Continue Pulmicort  twice daily and DuoNeb as needed.  History of paroxysmal atrial fibrillation and pulmonary embolism-patient declined anticoagulation in the past -EKG showing normal sinus rhythm heart rate 77.  Patient has been declined anticoagulation in the past.  Unsure timeline of pulmonary embolism and previous A-fib. -Continue cardiac monitoring.  Chronic pain syndrome  Morbid obesity Obesity hypoventilation syndrome Obstructive sleep apnea - Patient is noncompliant with CPAP at home   Chronic pain syndrome - Holding home oxycodone , tramadol  in the setting of respiratory distress and on BiPAP.   Peripheral neuropathy - Continue gabapentin  3 times daily  GAD - Continue Lexapro   CKD stage II - Renal function at baseline.  Obstructive sleep apnea - Currently on BiPAP tonight for sleep exacerbation.  Need to continue CPAP at bedtime from tomorrow  DVT prophylaxis:  SQ Heparin  SCD and TED hose Code Status:  Full Code Diet:  Family Communication:  *** Family was present at bedside, at the time of interview.  Opportunity was given to ask question and all questions were answered satisfactorily.  Disposition Plan:  ***  Consults:  ***  Admission status:   Observation, Step Down Unit  Severity of Illness: The appropriate patient status for this patient is OBSERVATION. Observation status is judged to be reasonable and necessary in order to provide the required intensity of service to ensure the patient's safety. The patient's presenting symptoms, physical exam findings, and initial radiographic and  laboratory data in the context of their medical condition is felt to place them at decreased risk for further clinical deterioration. Furthermore, it is anticipated that the patient will be medically stable for discharge from the hospital within 2 midnights of admission.     Wayne Moone, MD [facility]  How to contact the [facility] Attending or Consulting provider 7A - 7P or covering provider during after hours 7P -7A, for this patient.  Check the care team in [facility] and look for a) attending/consulting [facility] provider listed and b) the [facility] team listed Log into [email or web address] and use [place]'s universal password to access. If you do not have the password, please contact the hospital operator. Locate the [facility] provider you are looking for under [facility] and page to a number that you can be directly reached. If you still have difficulty reaching the provider, please page the [facility] (Director on Call) for the Hospitalists listed on amion for assistance.  12/20/2023, 11:32 PM

## 2023-12-20 NOTE — ED Triage Notes (Signed)
 Pt BIB [facility] from home. Pt c/o SOB x2days; w/ sudden onset on nausea tonight. Denies CP. Pt reports he had recent medication change and was taking his Lasix  PRN instead of daily. 5L [place] at baseline; arrived to ED at 96% On CPAP.

## 2023-12-20 NOTE — ED Provider Notes (Signed)
 [place] EMERGENCY DEPARTMENT AT [facility] Provider Note   CSN: [id number] Arrival date & time: 12/20/23  2037     History Chief Complaint  Patient presents with   Shortness of Breath    Wayne Booth is a 83 y.o. male w/ PMHx hypertension, atrial flutter, CKD, chronic PE, HFpEF on 5 L nasal cannula baseline who presents to the ED for evaluation of worsening shortness of breath.  Patient arrives via EMS on CPAP.  Patient denies fevers chills cough or chest pain.  He reports worsening shortness of breath for the past 2 days.  States he is taking Lasix  as needed not consistently.       Physical Exam Updated Vital Signs BP (!) 140/82   Pulse 72   Temp 98.6 F (37 C) (Axillary)   Resp (!) 27   Ht 5' 11 (1.803 m)   Wt 105 kg   SpO2 100%   BMI 32.29 kg/m  Physical Exam Vitals and nursing note reviewed.  Constitutional:      General: He is not in acute distress.    Appearance: He is well-developed.     Comments: Chronically ill-appearing   Eyes:     Conjunctiva/sclera: Conjunctivae normal.    Cardiovascular:     Rate and Rhythm: Normal rate and regular rhythm.     Pulses: Normal pulses.     Heart sounds: Normal heart sounds. No murmur heard. Pulmonary:     Effort: Pulmonary effort is normal. No respiratory distress.     Breath sounds: Examination of the right-middle field reveals rales. Examination of the left-middle field reveals rales. Examination of the right-lower field reveals rales. Examination of the left-lower field reveals rales. Rales present.  Abdominal:     Palpations: Abdomen is soft.     Tenderness: There is no abdominal tenderness.   Musculoskeletal:        General: No swelling.     Cervical back: Neck supple.     Right lower leg: No edema.     Left lower leg: Edema present.   Skin:    General: Skin is warm and dry.     Capillary Refill: Capillary refill takes less than 2 seconds.     Coloration: Skin is pale.   Neurological:      Mental Status: He is alert.   Psychiatric:        Mood and Affect: Mood normal.     ED Results / Procedures / Treatments   Labs (all labs ordered are listed, but only abnormal results are displayed) Labs Reviewed  COMPREHENSIVE METABOLIC PANEL WITH GFR - Abnormal; Notable for the following components:      Result Value   Calcium 8.0 (*)    Albumin  2.6 (*)    All other components within normal limits  I-STAT CHEM 8, ED - Abnormal; Notable for the following components:   Calcium, Ion 1.12 (*)    All other components within normal limits  I-STAT VENOUS BLOOD GAS, ED - Abnormal; Notable for the following components:   pO2, Ven 81 (*)    Bicarbonate 31.2 (*)    TCO2 33 (*)    Acid-Base Excess 5.0 (*)    Calcium, Ion 1.12 (*)    All other components within normal limits  RESP PANEL BY RT-PCR (RSV, FLU A&B, COVID)  [id number]  CBC WITH DIFFERENTIAL/PLATELET  BRAIN NATRIURETIC PEPTIDE  TROPONIN I (HIGH SENSITIVITY)  TROPONIN I (HIGH SENSITIVITY)    EKG EKG Interpretation Date/Time:  Monday  December 20 2023 20:44:28 EDT Ventricular Rate:  77 PR Interval:  205 QRS Duration:  96 QT Interval:  381 QTC Calculation: 432 R Axis:   -16  Text Interpretation: Sinus rhythm Borderline left axis deviation Low voltage, precordial leads Probable anteroseptal infarct, old No significant change since last tracing Confirmed by Patt Alm DEL [id number]) on 12/20/2023 8:47:59 PM  Radiology DG Chest Port 1 View Result Date: 12/20/2023 CLINICAL DATA:  Shortness of breath EXAM: PORTABLE CHEST 1 VIEW COMPARISON:  11/28/2023 FINDINGS: Cardiomegaly, vascular congestion. Low lung volumes. Small bilateral pleural effusions again noted, unchanged. Patchy bilateral lower lobe airspace opacities. No acute bony abnormality. IMPRESSION: Cardiomegaly, vascular congestion. Low lung volumes with bilateral lower lobe airspace opacities which could reflect edema or infection. Small bilateral effusions. Electronically Signed    By: Franky Crease M.D.   On: 12/20/2023 21:15    Medications Ordered in ED Medications  furosemide  (LASIX ) injection 40 mg (has no administration in time range)    ED Course/ Medical Decision Making/ A&P  Wayne Booth is a 83 y.o. male presents as detailed above  Differential ddx: CHF exacerbation, pulmonary edema, pleural effusion, acute hypoxic respiratory failure, pneumonia,  On arrival, patient afebrile hemodynamically stable.  Patient transition to BiPAP.  Saturating well on BiPAP.  Work of breathing improved.  ED Work-up: Please see details of labs and imaging listed above. No ischemic changes on EKG. Patient likely with CHF exacerbation.  Will administer Lasix .  No elevation of troponins.  No acidosis or hypercarbia   Overall impression CHF exacerbation.  Patient will be admitted for further evaluation and treatment. Patient seen with supervising physician who agrees with plan.  Final Clinical Impression(s) / ED Diagnoses Final diagnoses:  Acute on chronic congestive heart failure, unspecified heart failure type (HCC)     Waddell Seats, DO PGY-3 Emergency Medicine    Seats Waddell, DO 12/20/23 2313    Patt Alm Macho, MD 12/21/23 [phone]

## 2023-12-20 NOTE — ED Notes (Signed)
 Called CCMD for pt monitoring

## 2023-12-21 ENCOUNTER — Observation Stay (HOSPITAL_BASED_OUTPATIENT_CLINIC_OR_DEPARTMENT_OTHER)

## 2023-12-21 DIAGNOSIS — J8410 Pulmonary fibrosis, unspecified: Secondary | ICD-10-CM | POA: Diagnosis present

## 2023-12-21 DIAGNOSIS — Z6832 Body mass index (BMI) 32.0-32.9, adult: Secondary | ICD-10-CM | POA: Diagnosis not present

## 2023-12-21 DIAGNOSIS — R918 Other nonspecific abnormal finding of lung field: Secondary | ICD-10-CM | POA: Diagnosis not present

## 2023-12-21 DIAGNOSIS — J90 Pleural effusion, not elsewhere classified: Secondary | ICD-10-CM | POA: Diagnosis not present

## 2023-12-21 DIAGNOSIS — I5033 Acute on chronic diastolic (congestive) heart failure: Secondary | ICD-10-CM | POA: Diagnosis not present

## 2023-12-21 DIAGNOSIS — Z7189 Other specified counseling: Secondary | ICD-10-CM | POA: Diagnosis not present

## 2023-12-21 DIAGNOSIS — I130 Hypertensive heart and chronic kidney disease with heart failure and stage 1 through stage 4 chronic kidney disease, or unspecified chronic kidney disease: Secondary | ICD-10-CM | POA: Diagnosis present

## 2023-12-21 DIAGNOSIS — E66811 Obesity, class 1: Secondary | ICD-10-CM | POA: Diagnosis present

## 2023-12-21 DIAGNOSIS — N183 Chronic kidney disease, stage 3 unspecified: Secondary | ICD-10-CM | POA: Diagnosis not present

## 2023-12-21 DIAGNOSIS — I10 Essential (primary) hypertension: Secondary | ICD-10-CM | POA: Diagnosis not present

## 2023-12-21 DIAGNOSIS — F411 Generalized anxiety disorder: Secondary | ICD-10-CM | POA: Diagnosis present

## 2023-12-21 DIAGNOSIS — Z9981 Dependence on supplemental oxygen: Secondary | ICD-10-CM | POA: Diagnosis not present

## 2023-12-21 DIAGNOSIS — I2510 Atherosclerotic heart disease of native coronary artery without angina pectoris: Secondary | ICD-10-CM | POA: Diagnosis not present

## 2023-12-21 DIAGNOSIS — U099 Post covid-19 condition, unspecified: Secondary | ICD-10-CM | POA: Diagnosis present

## 2023-12-21 DIAGNOSIS — G894 Chronic pain syndrome: Secondary | ICD-10-CM | POA: Diagnosis present

## 2023-12-21 DIAGNOSIS — J9611 Chronic respiratory failure with hypoxia: Secondary | ICD-10-CM | POA: Diagnosis not present

## 2023-12-21 DIAGNOSIS — J449 Chronic obstructive pulmonary disease, unspecified: Secondary | ICD-10-CM | POA: Diagnosis present

## 2023-12-21 DIAGNOSIS — I509 Heart failure, unspecified: Secondary | ICD-10-CM | POA: Diagnosis not present

## 2023-12-21 DIAGNOSIS — R0989 Other specified symptoms and signs involving the circulatory and respiratory systems: Secondary | ICD-10-CM | POA: Diagnosis not present

## 2023-12-21 DIAGNOSIS — R0602 Shortness of breath: Secondary | ICD-10-CM | POA: Diagnosis not present

## 2023-12-21 DIAGNOSIS — Z515 Encounter for palliative care: Secondary | ICD-10-CM

## 2023-12-21 DIAGNOSIS — M10079 Idiopathic gout, unspecified ankle and foot: Secondary | ICD-10-CM | POA: Diagnosis not present

## 2023-12-21 DIAGNOSIS — E871 Hypo-osmolality and hyponatremia: Secondary | ICD-10-CM | POA: Diagnosis not present

## 2023-12-21 DIAGNOSIS — I4892 Unspecified atrial flutter: Secondary | ICD-10-CM | POA: Diagnosis present

## 2023-12-21 DIAGNOSIS — I2782 Chronic pulmonary embolism: Secondary | ICD-10-CM | POA: Diagnosis present

## 2023-12-21 DIAGNOSIS — Z8709 Personal history of other diseases of the respiratory system: Secondary | ICD-10-CM | POA: Diagnosis not present

## 2023-12-21 DIAGNOSIS — E039 Hypothyroidism, unspecified: Secondary | ICD-10-CM | POA: Diagnosis present

## 2023-12-21 DIAGNOSIS — E662 Morbid (severe) obesity with alveolar hypoventilation: Secondary | ICD-10-CM | POA: Diagnosis present

## 2023-12-21 DIAGNOSIS — N182 Chronic kidney disease, stage 2 (mild): Secondary | ICD-10-CM | POA: Diagnosis not present

## 2023-12-21 DIAGNOSIS — M109 Gout, unspecified: Secondary | ICD-10-CM | POA: Diagnosis present

## 2023-12-21 DIAGNOSIS — N179 Acute kidney failure, unspecified: Secondary | ICD-10-CM | POA: Diagnosis not present

## 2023-12-21 DIAGNOSIS — G4733 Obstructive sleep apnea (adult) (pediatric): Secondary | ICD-10-CM | POA: Diagnosis not present

## 2023-12-21 DIAGNOSIS — Z1152 Encounter for screening for COVID-19: Secondary | ICD-10-CM | POA: Diagnosis not present

## 2023-12-21 DIAGNOSIS — I5031 Acute diastolic (congestive) heart failure: Secondary | ICD-10-CM | POA: Diagnosis not present

## 2023-12-21 DIAGNOSIS — I480 Paroxysmal atrial fibrillation: Secondary | ICD-10-CM | POA: Diagnosis not present

## 2023-12-21 DIAGNOSIS — G629 Polyneuropathy, unspecified: Secondary | ICD-10-CM | POA: Diagnosis present

## 2023-12-21 LAB — ECHOCARDIOGRAM COMPLETE
Area-P 1/2: 3.21 cm2
Calc EF: 55.4 %
Height: 71 in
S' Lateral: 4.4 cm
Single Plane A2C EF: 55.1 %
Single Plane A4C EF: 55.6 %
Weight: 3703.73 [oz_av]

## 2023-12-21 LAB — BASIC METABOLIC PANEL WITH GFR
Anion gap: 8 (ref 5–15)
BUN: 8 mg/dL (ref 8–23)
CO2: 33 mmol/L — ABNORMAL HIGH (ref 22–32)
Calcium: 8.6 mg/dL — ABNORMAL LOW (ref 8.9–10.3)
Chloride: 99 mmol/L (ref 98–111)
Creatinine, Ser: 0.88 mg/dL (ref 0.61–1.24)
GFR, Estimated: >60 mL/min (ref >60)
Glucose, Bld: 89 mg/dL (ref 70–99)
Potassium: 3.9 mmol/L (ref 3.5–5.1)
Sodium: 140 mmol/L (ref 135–145)

## 2023-12-21 LAB — CBC
HCT: 43.7 % (ref 39.0–52.0)
Hemoglobin: 14 g/dL (ref 13.0–17.0)
MCH: 29.7 pg (ref 26.0–34.0)
MCHC: 32 g/dL (ref 30.0–36.0)
MCV: 92.6 fL (ref 80.0–100.0)
Platelets: 369 10*3/uL (ref 150–400)
RBC: 4.72 MIL/uL (ref 4.22–5.81)
RDW: 14 % (ref 11.5–15.5)
WBC: 7.1 10*3/uL (ref 4.0–10.5)
nRBC: 0 % (ref 0.0–0.2)

## 2023-12-21 LAB — BRAIN NATRIURETIC PEPTIDE: B Natriuretic Peptide: 92.4 pg/mL (ref 0.0–100.0)

## 2023-12-21 LAB — TROPONIN I (HIGH SENSITIVITY): Troponin I (High Sensitivity): 6 ng/L (ref <18)

## 2023-12-21 MED ORDER — PERFLUTREN LIPID MICROSPHERE
1.0000 mL | INTRAVENOUS | Status: AC | PRN
  Administered 2023-12-21: 5 mL via INTRAVENOUS

## 2023-12-21 MED ORDER — LORAZEPAM 1 MG PO TABS
0.5000 mg | ORAL_TABLET | Freq: Once | ORAL | Status: AC
  Administered 2023-12-21: 0.5 mg via ORAL
  Filled 2023-12-21: qty 1

## 2023-12-21 MED ORDER — TRAMADOL HCL 50 MG PO TABS
50.0000 mg | ORAL_TABLET | Freq: Three times a day (TID) | ORAL | Status: DC | PRN
  Administered 2023-12-21 – 2023-12-23 (×2): 50 mg via ORAL
  Filled 2023-12-21 (×2): qty 1

## 2023-12-21 MED ORDER — SPIRONOLACTONE 25 MG PO TABS
25.0000 mg | ORAL_TABLET | Freq: Every day | ORAL | Status: DC
  Administered 2023-12-22 – 2023-12-24 (×3): 25 mg via ORAL
  Filled 2023-12-21 (×3): qty 1

## 2023-12-21 MED ORDER — NAPHAZOLINE-GLYCERIN 0.012-0.25 % OP SOLN
1.0000 [drp] | Freq: Four times a day (QID) | OPHTHALMIC | Status: DC | PRN
  Filled 2023-12-21: qty 15

## 2023-12-21 MED ORDER — GABAPENTIN 300 MG PO CAPS
300.0000 mg | ORAL_CAPSULE | Freq: Two times a day (BID) | ORAL | Status: DC
  Administered 2023-12-21 – 2023-12-24 (×6): 300 mg via ORAL
  Filled 2023-12-21 (×6): qty 1

## 2023-12-21 NOTE — ED Notes (Signed)
 CCMD called and verified

## 2023-12-21 NOTE — Progress Notes (Signed)
 Pt. Continued to rip his mask off requesting to come off the bipap. Rn took pt. Off and placed pt. On nasal cannula.

## 2023-12-21 NOTE — Consult Note (Signed)
 Consultation Note Date: 12/21/2023   Patient Name: Wayne Booth  DOB: [birth year]  MRN: [id number]  Age / Sex: 83 y.o., male  PCP: Katrinka Garnette KIDD, MD Referring Physician: Fairy Frames, MD  Reason for Consultation: Establishing goals of care  HPI/Patient Profile: 83 y.o. male  with past medical history of chronic diastolic heart failure preserved EF 65 to 70%, chronic hypoxic respiratory failure 5 L oxygen  at baseline, COVID associated pulmonary fibrosis, history paroxysmal atrial fibrillation not on anticoagulation-patient refused anticoagulation in the past, COPD, morbid obesity, OSA and chronic physical debility admitted on 12/20/2023 with worsening dyspnea for 2 to 3 days.   Patient admitted for acute on chronic diastolic heart failure exacerbation and acute on chronic hypoxic respiratory failure.  Patient has been hospitalized 4 times in the past 6 months.  He has had poor compliance with medications.  PMT has been consulted to assist with goals of care conversation.  Clinical Assessment and Goals of Care:  I have reviewed medical records including EPIC notes, labs and imaging, discussed with RN, assessed the patient and then met at the bedside to discuss diagnosis prognosis, GOC, EOL wishes, disposition and options.  I spoke to his wife Jody by phone at the bedside at the end of our conversation to provide update and explore her thoughts and feelings as well.  I introduced Palliative Medicine as specialized medical care for people living with serious illness. It focuses on providing relief from the symptoms and stress of a serious illness. The goal is to improve quality of life for both the patient and the family.  We discussed a brief life review of the patient and then focused on their current illness.   I attempted to elicit values and goals of care important to the patient.    Medical History Review and Understanding:  We discussed patient's acute  illness in the context of their chronic comorbidities. Patient and his wife understand the severity of patient's illness after extensive education.  Social History: Patient has been married for 60 years.  He had 2 sons, though unfortunately 1 died a few years ago.  He still struggles to cope with this.  He is at [facility].  He quit drinking a few years ago.  Functional and Nutritional State: Patient acknowledges debility.  Albumin  of 2.6 on 6/23.  Palliative Symptoms: Patient denies pain at baseline.  Reports his only symptom is dyspnea.  He then acknowledges anxiety after further discussions  Advance Directives: A detailed discussion regarding advanced directives was had.  He was unsure whether this has ever been completed.  Patient and his wife would like to complete during his hospitalization.  Code Status: Concepts specific to code status, artifical feeding and hydration, and rehospitalization were considered and discussed.  Patient is not sure of his wishes and would like more time to consider.  Discussion: Patient initially told me that he did not have heart failure and that nobody has told him about this diagnosis.  I reviewed his history of recurrent hospitalizations indicating this is an ongoing issue for him.  Provided education on the long-term impact of acute and chronic heart failure, explaining physiological process of diastolic stiffening and risk for further decline in heart function if recurrent hospitalizations continue.  Emphasized importance of medication compliance and rationale for use of Lasix  and spironolactone.  I then spoke with his wife by phone to provide update and education as above.  She shares that she did not know about the heart failure either.  She is frustrated that he has refused his medications and is already back in the hospital.  Patient shares he is going to straighten up and take his medications as prescribed to get back on track and prevent further  hospitalizations.  Wife clarified that he has not already completed advanced directives.  She would like to be present to participate and will be here tomorrow at 3 PM.  Patient's ultimate goal is to continue his home health therapy with the same therapist, as he has developed a good relationship with them.  He continues to decline any SNF placement at all.  Discussed the importance of continued conversation with family and the medical providers regarding overall plan of care and treatment options, ensuring decisions are within the context of the patient's values and GOCs.   Questions and concerns were addressed.  Hard Choices booklet left for review. The family was encouraged to call with questions or concerns.  PMT will continue to support holistically.   SUMMARY OF RECOMMENDATIONS   - Continue full code/full scope treatment - Patient will continue to deliberate on any limitations he would like to set on his care - Goal is to resume home health therapy with the same therapists currently assisting him - Patient shares he is motivated to improve medication compliance and break cycle repeated hospitalizations - Spiritual care consult for assistance with advance directive tomorrow at 3 PM earlier - Psychosocial and emotional support provided - PMT will continue to follow and support  Prognosis:  Unable to determine  Discharge Planning: To Be Determined      Primary Diagnoses: Present on Admission:  Acute on chronic diastolic CHF (congestive heart failure) (HCC)  Chronic hypoxic respiratory failure (HCC)  Obstructive sleep apnea  Chronic pain syndrome  Essential hypertension  Acute diastolic heart failure (HCC)  GAD (generalized anxiety disorder)    Physical Exam Vitals and nursing note reviewed.  Constitutional:      General: He is not in acute distress.    Appearance: He is ill-appearing.   Cardiovascular:     Rate and Rhythm: Normal rate.  Pulmonary:     Effort:  Pulmonary effort is normal.   Neurological:     Mental Status: He is alert and oriented to person, place, and time.   Psychiatric:        Mood and Affect: Mood normal.        Behavior: Behavior normal.     Vital Signs: BP 120/63 (BP Location: Left Arm)   Pulse 78   Temp 98.5 F (36.9 C) (Oral)   Resp (!) 22   Ht 5' 11 (1.803 m)   Wt 105 kg   SpO2 99%   BMI 32.29 kg/m  Pain Scale: 0-10   Pain Score: 0-No pain   SpO2: SpO2: 99 % O2 Device:SpO2: 99 % O2 Flow Rate: .O2 Flow Rate (L/min): 5 L/min    MDM: high   Britnay Magnussen SHAUNNA Fell, PA-C  Palliative Medicine Team Team phone # [phone]  Thank you for allowing the Palliative Medicine Team to assist in the care of this patient. Please utilize secure chat with additional questions, if there is no response within 30 minutes please call the above phone number.  Palliative Medicine Team providers are available by phone from 7am to 7pm daily and can be reached through the team cell phone.  Should this patient require assistance outside of these hours, please call the patient's attending physician.

## 2023-12-21 NOTE — ED Notes (Signed)
 Pt in bed, pt has small wound to L buttock, re positioned pt and placed dressing, meal tray given.

## 2023-12-21 NOTE — ED Notes (Signed)
 Offered patient PRN Tylenol  for 7/10 pain, patient refused.

## 2023-12-21 NOTE — ED Notes (Signed)
Shift report received, assumed care of patient at this time 

## 2023-12-21 NOTE — Progress Notes (Signed)
 PROGRESS NOTE    Wayne Booth  FMW:[id number] DOB: 1941-04-23 DOA: 12/20/2023 PCP: Katrinka Garnette KIDD, MD  [name] with chronic diastolic CHF, chronic hypoxic respiratory failure on 5 L home O2, COPD, COVID associated pulmonary fibrosis, paroxysmal A-fib not on anticoagulation, morbid obesity, OSA intolerant of CPAP presented to the ED with worsening dyspnea for 2 to 3 days, patient reported poor compliance with Lasix  and CPAP. - In the ER he was tachypneic in respiratory distress, troponin 5, BNP 92, creatinine normal at 0.8 EKG in sinus rhythm, chest x-ray with cardiomegaly pulmonary vascular congestion, bilateral lower lobe opacities could reflect edema or infection, small bilateral effusions   Subjective: -Feels fair, cannot give me a good reason for poor compliance with diuretics  Assessment and Plan:  Acute on chronic diastolic heart failure exacerbation Acute hypoxic respiratory failure -Multiple hospitalizations with respiratory failure, fifth admission in 3 months now -Has underlying COPD, OSA/OHS, diastolic CHF and COVID-related pulmonary fibrosis -Last echo 3/25 with EF 65-70%, diastolic function and RV could not be assessed then, repeat echo ordered by admitting MD will follow-up -Compounded by poor compliance with diuretics, CPAP -Continue IV Lasix  today, increase Aldactone -May be a borderline candidate for SGLT2i -Given significant disease burden and frequent hospitalizations will request palliative care eval as well   Chronic hypoxic respiratory failure 5 L oxygen  at baseline History of COPD History of COVID associated pulmonary fibrosis Chronic hypoxic respiratory failure 5 L oxygen  at baseline. - Continue Pulmicort  twice daily and DuoNeb as needed.   History of paroxysmal atrial fibrillation  -In sinus rhythm at this time, has declined anticoagulation in the past   Chronic pain syndrome   Morbid obesity Obesity hypoventilation syndrome Obstructive sleep apnea -  Patient is noncompliant with CPAP at home -Reorder nightly CPAP while inpatient, might be a candidate for NIV, will consult TOC    Chronic pain syndrome - Holding home oxycodone , tramadol  in the setting of respiratory distress and on BiPAP.     Peripheral neuropathy - Continue gabapentin  3 times daily   GAD - Continue Lexapro    CKD stage II - Renal function at baseline.  DVT prophylaxis: Heparin  subcutaneous Code Status: Full code Family Communication: None present Disposition Plan: Home pending improvement  Consultants: Palliative care   Procedures:   Antimicrobials:    Objective: Vitals:   12/21/23 0330 12/21/23 0700 12/21/23 0733 12/21/23 0821  BP: 119/61 112/74    Pulse: 86 76    Resp: (!) 23 (!) 27    Temp: 98.3 F (36.8 C)  98.1 F (36.7 C)   TempSrc:      SpO2: 99% 100%  100%  Weight:      Height:        Intake/Output Summary (Last 24 hours) at 12/21/2023 1010 Last data filed at 12/21/2023 0153 Gross per 24 hour  Intake --  Output 1000 ml  Net -1000 ml   Filed Weights   12/20/23 2043  Weight: 105 kg    Examination:  General exam: Obese chronically ill male laying in bed, AO x 3 HEENT: Positive JVD CVS: S1-S2, regular rhythm Lungs: Diffuse bilateral coarse crackles Abd: nondistended, soft and nontender.Normal bowel sounds heard. Central nervous system: Alert and oriented. No focal neurological deficits. Extremities: Trace edema Skin: No rashes Psychiatry:  Mood & affect appropriate.     Data Reviewed:   CBC: Recent Labs  Lab 12/20/23 2048 12/20/23 2103 12/20/23 2104 12/21/23 0223  WBC 6.6  --   --  7.1  NEUTROABS  4.9  --   --   --   HGB 13.8 13.9 14.6 14.0  HCT 42.4 41.0 43.0 43.7  MCV 92.0  --   --  92.6  PLT 352  --   --  369   Basic Metabolic Panel: Recent Labs  Lab 12/20/23 2048 12/20/23 2103 12/20/23 2104 12/21/23 0223  NA 137 141 141 140  K 4.5 4.0 4.0 3.9  CL 104  --  101 99  CO2 27  --   --  33*  GLUCOSE 84   --  86 89  BUN 10  --  10 8  CREATININE 0.85  --  0.90 0.88  CALCIUM 8.0*  --   --  8.6*   GFR: Estimated Creatinine Clearance: 81.2 mL/min (by C-G formula based on SCr of 0.88 mg/dL). Liver Function Tests: Recent Labs  Lab 12/20/23 2048  AST 29  ALT 22  ALKPHOS 54  BILITOT 1.0  PROT 7.1  ALBUMIN  2.6*   No results for input(s): LIPASE, AMYLASE in the last 168 hours. No results for input(s): AMMONIA in the last 168 hours. Coagulation Profile: No results for input(s): INR, PROTIME in the last 168 hours. Cardiac Enzymes: No results for input(s): CKTOTAL, CKMB, CKMBINDEX, TROPONINI in the last 168 hours. BNP (last 3 results) No results for input(s): PROBNP in the last 8760 hours. HbA1C: No results for input(s): HGBA1C in the last 72 hours. CBG: No results for input(s): GLUCAP in the last 168 hours. Lipid Profile: No results for input(s): CHOL, HDL, LDLCALC, TRIG, CHOLHDL, LDLDIRECT in the last 72 hours. Thyroid  Function Tests: No results for input(s): TSH, T4TOTAL, FREET4, T3FREE, THYROIDAB in the last 72 hours. Anemia Panel: No results for input(s): VITAMINB12, FOLATE, FERRITIN, TIBC, IRON, RETICCTPCT in the last 72 hours. Urine analysis:    Component Value Date/Time   COLORURINE AMBER (A) 04/20/2019 1438   APPEARANCEUR CLEAR 04/20/2019 1438   LABSPEC 1.017 04/20/2019 1438   PHURINE 5.0 04/20/2019 1438   GLUCOSEU NEGATIVE 04/20/2019 1438   HGBUR MODERATE (A) 04/20/2019 1438   HGBUR 2+ 09/22/2007 1444   BILIRUBINUR NEGATIVE 04/20/2019 1438   BILIRUBINUR n 11/07/2015 1417   KETONESUR NEGATIVE 04/20/2019 1438   PROTEINUR NEGATIVE 04/20/2019 1438   UROBILINOGEN 1.0 11/07/2015 1417   UROBILINOGEN 1.0 05/22/2011 1454   NITRITE NEGATIVE 04/20/2019 1438   LEUKOCYTESUR NEGATIVE 04/20/2019 1438   Sepsis Labs: @LABRCNTIP (procalcitonin:4,lacticidven:4)  ) Recent Results (from the past 240 hours)  Resp panel by  RT-PCR (RSV, Flu A&B, Covid) Anterior Nasal Swab     Status: None   Collection Time: 12/20/23  8:47 PM   Specimen: Anterior Nasal Swab  Result Value Ref Range Status   SARS Coronavirus 2 by RT PCR NEGATIVE NEGATIVE Final   Influenza A by PCR NEGATIVE NEGATIVE Final   Influenza B by PCR NEGATIVE NEGATIVE Final    Comment: (NOTE) The Xpert Xpress SARS-CoV-2/FLU/RSV plus assay is intended as an aid in the diagnosis of influenza from Nasopharyngeal swab specimens and should not be used as a sole basis for treatment. Nasal washings and aspirates are unacceptable for Xpert Xpress SARS-CoV-2/FLU/RSV testing.  Fact Sheet for Patients: BloggerCourse.com  Fact Sheet for Healthcare Providers: SeriousBroker.it  This test is not yet approved or cleared by the [place]  FDA and has been authorized for detection and/or diagnosis of SARS-CoV-2 by FDA under an Emergency Use Authorization (EUA). This EUA will remain in effect (meaning this test can be used) for the duration of the COVID-19 declaration under  Section 564(b)(1) of the Act, 21 U.S.C. section 360bbb-3(b)(1), unless the authorization is terminated or revoked.     Resp Syncytial Virus by PCR NEGATIVE NEGATIVE Final    Comment: (NOTE) Fact Sheet for Patients: BloggerCourse.com  Fact Sheet for Healthcare Providers: SeriousBroker.it  This test is not yet approved or cleared by the [place]  FDA and has been authorized for detection and/or diagnosis of SARS-CoV-2 by FDA under an Emergency Use Authorization (EUA). This EUA will remain in effect (meaning this test can be used) for the duration of the COVID-19 declaration under Section 564(b)(1) of the Act, 21 U.S.C. section 360bbb-3(b)(1), unless the authorization is terminated or revoked.  Performed at [facility], [address] [place], [place] [address]       Radiology Studies: DG Chest Port 1 View Result Date: 12/20/2023 CLINICAL DATA:  Shortness of breath EXAM: PORTABLE CHEST 1 VIEW COMPARISON:  11/28/2023 FINDINGS: Cardiomegaly, vascular congestion. Low lung volumes. Small bilateral pleural effusions again noted, unchanged. Patchy bilateral lower lobe airspace opacities. No acute bony abnormality. IMPRESSION: Cardiomegaly, vascular congestion. Low lung volumes with bilateral lower lobe airspace opacities which could reflect edema or infection. Small bilateral effusions. Electronically Signed   By: Franky Crease M.D.   On: 12/20/2023 21:15     Scheduled Meds:  budesonide   0.25 mg Nebulization BID   escitalopram   20 mg Oral Daily   furosemide   40 mg Intravenous BID   gabapentin   300 mg Oral TID   heparin   5,000 Units Subcutaneous Q8H   sodium chloride  flush  3 mL Intravenous Q12H   sodium chloride  flush  3 mL Intravenous Q12H   spironolactone  12.5 mg Oral Daily   Continuous Infusions:  sodium chloride        LOS: 0 days    Time spent:    Sigurd Pac, MD [facility]   12/21/2023, 10:10 AM

## 2023-12-22 ENCOUNTER — Other Ambulatory Visit (HOSPITAL_COMMUNITY): Payer: Self-pay

## 2023-12-22 ENCOUNTER — Inpatient Hospital Stay (HOSPITAL_COMMUNITY)

## 2023-12-22 ENCOUNTER — Telehealth (HOSPITAL_COMMUNITY): Payer: Self-pay

## 2023-12-22 ENCOUNTER — Encounter (HOSPITAL_COMMUNITY): Payer: Self-pay | Admitting: Internal Medicine

## 2023-12-22 DIAGNOSIS — Z7189 Other specified counseling: Secondary | ICD-10-CM | POA: Diagnosis not present

## 2023-12-22 DIAGNOSIS — M10079 Idiopathic gout, unspecified ankle and foot: Secondary | ICD-10-CM

## 2023-12-22 DIAGNOSIS — I10 Essential (primary) hypertension: Secondary | ICD-10-CM | POA: Diagnosis not present

## 2023-12-22 DIAGNOSIS — J9611 Chronic respiratory failure with hypoxia: Secondary | ICD-10-CM | POA: Diagnosis not present

## 2023-12-22 DIAGNOSIS — I480 Paroxysmal atrial fibrillation: Secondary | ICD-10-CM | POA: Diagnosis not present

## 2023-12-22 DIAGNOSIS — I5033 Acute on chronic diastolic (congestive) heart failure: Secondary | ICD-10-CM | POA: Diagnosis not present

## 2023-12-22 DIAGNOSIS — I2510 Atherosclerotic heart disease of native coronary artery without angina pectoris: Secondary | ICD-10-CM

## 2023-12-22 DIAGNOSIS — Z515 Encounter for palliative care: Secondary | ICD-10-CM | POA: Diagnosis not present

## 2023-12-22 DIAGNOSIS — N183 Chronic kidney disease, stage 3 unspecified: Secondary | ICD-10-CM | POA: Diagnosis not present

## 2023-12-22 DIAGNOSIS — E66811 Obesity, class 1: Secondary | ICD-10-CM

## 2023-12-22 LAB — BASIC METABOLIC PANEL WITH GFR
Anion gap: 10 (ref 5–15)
BUN: 14 mg/dL (ref 8–23)
CO2: 31 mmol/L (ref 22–32)
Calcium: 8.5 mg/dL — ABNORMAL LOW (ref 8.9–10.3)
Chloride: 94 mmol/L — ABNORMAL LOW (ref 98–111)
Creatinine, Ser: 1.13 mg/dL (ref 0.61–1.24)
GFR, Estimated: >60 mL/min (ref >60)
Glucose, Bld: 138 mg/dL — ABNORMAL HIGH (ref 70–99)
Potassium: 3.6 mmol/L (ref 3.5–5.1)
Sodium: 135 mmol/L (ref 135–145)

## 2023-12-22 MED ORDER — POTASSIUM CHLORIDE CRYS ER 20 MEQ PO TBCR
40.0000 meq | EXTENDED_RELEASE_TABLET | Freq: Once | ORAL | Status: AC
  Administered 2023-12-22: 40 meq via ORAL
  Filled 2023-12-22: qty 2

## 2023-12-22 MED ORDER — POLYETHYLENE GLYCOL 3350 17 G PO PACK
17.0000 g | PACK | Freq: Every day | ORAL | Status: DC
  Administered 2023-12-22 – 2023-12-24 (×2): 17 g via ORAL
  Filled 2023-12-22 (×3): qty 1

## 2023-12-22 MED ORDER — EMPAGLIFLOZIN 10 MG PO TABS
10.0000 mg | ORAL_TABLET | Freq: Every day | ORAL | Status: DC
  Administered 2023-12-22 – 2023-12-24 (×3): 10 mg via ORAL
  Filled 2023-12-22 (×3): qty 1

## 2023-12-22 MED ORDER — FUROSEMIDE 10 MG/ML IJ SOLN
60.0000 mg | Freq: Two times a day (BID) | INTRAMUSCULAR | Status: DC
  Administered 2023-12-22: 60 mg via INTRAVENOUS
  Filled 2023-12-22: qty 6

## 2023-12-22 NOTE — Assessment & Plan Note (Addendum)
 Echocardiogram with preserved LV systolic function with EF 55 to 60%, RV systolic function preserved, no significant valvular disease.   Patient was placed on IV furosemide  for diuresis, negative fluid balance was achieved, with significant improvement in is symptoms.   Continue with Spironolactone  and SGLT 2 inh.  Diuresis with furosemide  40 mg po daily.  Possible addition or ace inh or ARB as outpatient.   Acute cardiogenic pulmonary edema with acute on chronic hypoxemic respiratory failure.  Patient was weaned off Cpap with good toleration. Follow up chest radiograph with hypoinflation, cardiomegaly, and persistent bilateral lower lobe interstitial infiltrates.  At the time of his discharge his 02 saturation is 99% on 4 L/min per [place] with no signs of respiratory distress,

## 2023-12-22 NOTE — Plan of Care (Signed)
 Pt ambulating in room and to bathroom

## 2023-12-22 NOTE — Assessment & Plan Note (Addendum)
 No signs of acute exacerbation Chronic hypoxemia, on supplemental 02 at home per [place] 5 L/min   COVID associated pulmonary fibrosis.

## 2023-12-22 NOTE — Plan of Care (Signed)
  Problem: Education: Goal: Knowledge of General Education information will improve Description: Including pain rating scale, medication(s)/side effects and non-pharmacologic comfort measures Outcome: Not Progressing   Problem: Health Behavior/Discharge Planning: Goal: Ability to manage health-related needs will improve Outcome: Not Progressing   Problem: Clinical Measurements: Goal: Ability to maintain clinical measurements within normal limits will improve Outcome: Not Progressing Goal: Will remain free from infection Outcome: Not Progressing Goal: Diagnostic test results will improve Outcome: Not Progressing Goal: Respiratory complications will improve Outcome: Not Progressing Goal: Cardiovascular complication will be avoided Outcome: Not Progressing   Problem: Activity: Goal: Risk for activity intolerance will decrease Outcome: Not Progressing   Problem: Nutrition: Goal: Adequate nutrition will be maintained Outcome: Not Progressing   Problem: Coping: Goal: Level of anxiety will decrease Outcome: Not Progressing   Problem: Elimination: Goal: Will not experience complications related to bowel motility Outcome: Not Progressing Goal: Will not experience complications related to urinary retention Outcome: Not Progressing   Problem: Pain Managment: Goal: General experience of comfort will improve and/or be controlled Outcome: Not Progressing   Problem: Safety: Goal: Ability to remain free from injury will improve Outcome: Not Progressing   Problem: Skin Integrity: Goal: Risk for impaired skin integrity will decrease Outcome: Not Progressing   Problem: Education: Goal: Ability to demonstrate management of disease process will improve Outcome: Not Progressing Goal: Ability to verbalize understanding of medication therapies will improve Outcome: Not Progressing Goal: Individualized Educational Video(s) Outcome: Not Progressing   Problem: Activity: Goal:  Capacity to carry out activities will improve Outcome: Not Progressing   Problem: Cardiac: Goal: Ability to achieve and maintain adequate cardiopulmonary perfusion will improve Outcome: Not Progressing

## 2023-12-22 NOTE — Evaluation (Signed)
 Physical Therapy Evaluation Patient Details Name: Wayne Booth MRN: [id number] DOB: 1941-06-06 Today's Date: 12/22/2023  History of Present Illness  Wayne Booth is a 83 y.o. male presented to ED complaining of shortness of breath for 2 days and associated nausea. PMH: chronic diastolic heart failure preserved EF 65 to 70%, chronic hypoxic respiratory failure 5 L oxygen  at baseline, COVID associated pulmonary fibrosis, history paroxysmal atrial fibrillation not on anticoagulation-patient refused anticoagulation in the past, COPD, morbid obesity, OSA and chronic physical debility   Clinical Impression  Pt known to PT services form recent admission on 12/03/23. Pt with appropriate comments towards women but is cooperative and can be re-directed. Pt to strongly benefit from rollator or RW however despite max encouragement pt continues to decline use of one. Pt with drop of SPO2 to 82% on 6LO2 via [place] during ambulation with noted 4/4 DOE requiring increased assist to maintain balance due to onset of fatigue and DOB. Pt educated on how rollator can assist with energy conservation and make ambulation less taxing and improve safety/decrease fall risk as pt would have a place to sit when getting SOB however pt continues to decline. Recommend continuation of HHPT services upon d/c. Acute PT to cont to follow.        If plan is discharge home, recommend the following: A little help with walking and/or transfers;A little help with bathing/dressing/bathroom;Assistance with cooking/housework;Assist for transportation;Help with stairs or ramp for entrance   Can travel by private vehicle   Yes    Equipment Recommendations  (has DME but refuses to use it)  Recommendations for Other Services       Functional Status Assessment Patient has had a recent decline in their functional status and demonstrates the ability to make significant improvements in function in a reasonable and predictable amount of time.      Precautions / Restrictions Precautions Precautions: Fall Precaution/Restrictions Comments: watch SpO2, on 5LO2 via [place] Restrictions Weight Bearing Restrictions Per Provider Order: No      Mobility  Bed Mobility               General bed mobility comments: not assessed, pt reports sleeping in recliner    Transfers Overall transfer level: Needs assistance Equipment used: None Transfers: Sit to/from Stand Sit to Stand: Contact guard assist           General transfer comment: pt pushed up with bilat UEs, wide base of support    Ambulation/Gait Ambulation/Gait assistance: Contact guard assist Gait Distance (Feet): 100 Feet Assistive device: None Gait Pattern/deviations: Decreased step length - left, Decreased stride length, Decreased step length - right, Drifts right/left, Trunk flexed Gait velocity: dec Gait velocity interpretation: <1.8 ft/sec, indicate of risk for recurrent falls   General Gait Details: pt declined use of rollator or RW, reaching for portable O2 tank to hold onto, PT advised against. Pts on 6lO2 via [place] and noted SPO2 to dec to 82%, noted 4/4 DOE and became unsteady requiring minA to return to chair. pt continuously refusing to use RW  Stairs            Wheelchair Mobility     Tilt Bed    Modified Rankin (Stroke Patients Only)       Balance Overall balance assessment: Needs assistance Sitting-balance support: Feet supported Sitting balance-Leahy Scale: Good     Standing balance support: Single extremity supported, During functional activity Standing balance-Leahy Scale: Fair Standing balance comment: with onset of fatigue/SOB pt becomes more unsteady  requiring external support                             Pertinent Vitals/Pain Pain Assessment Pain Assessment: No/denies pain Pain Intervention(s): Monitored during session    Home Living Family/patient expects to be discharged to:: Private residence Living  Arrangements: Spouse/significant other Available Help at Discharge: Family;Available 24 hours/day Type of Home: House Home Access: Level entry       Home Layout: Multi-level;Able to live on main level with bedroom/bathroom Home Equipment: Shower seat - built Charity fundraiser (2 wheels);Cane - single point;Hand held shower head      Prior Function Prior Level of Function : Independent/Modified Independent;Driving             Mobility Comments: reports not using a RW, drives around town but doesn't go into grocery store ADLs Comments: indep, uses shower seat     Extremity/Trunk Assessment   Upper Extremity Assessment Upper Extremity Assessment: Overall WFL for tasks assessed    Lower Extremity Assessment Lower Extremity Assessment: Generalized weakness    Cervical / Trunk Assessment Cervical / Trunk Assessment: Kyphotic  Communication   Communication Communication: No apparent difficulties    Cognition Arousal: Alert Behavior During Therapy: WFL for tasks assessed/performed   PT - Cognitive impairments: No family/caregiver present to determine baseline                       PT - Cognition Comments: pt makes inappropriate comments (asking PT if she wants to get under the blanket with him, referencing PT's breast stating I'll follow thos boobs anywhere) Following commands: Intact       Cueing Cueing Techniques: Verbal cues, Gestural cues     General Comments General comments (skin integrity, edema, etc.): SpO2 dec to 82% on 6LO2 via [place] while amb, recovered to 95% within 2 min    Exercises     Assessment/Plan    PT Assessment Patient needs continued PT services  PT Problem List Decreased strength;Cardiopulmonary status limiting activity;Decreased activity tolerance;Decreased balance;Decreased mobility;Decreased knowledge of use of DME       PT Treatment Interventions DME instruction;Balance training;Neuromuscular re-education;Gait training;Stair  training;Functional mobility training;Therapeutic activities;Therapeutic exercise;Patient/family education    PT Goals (Current goals can be found in the Care Plan section)  Acute Rehab PT Goals Patient Stated Goal: Return Home PT Goal Formulation: With patient Time For Goal Achievement: 01/05/24 Potential to Achieve Goals: Good    Frequency Min 2X/week     Co-evaluation               AM-PAC PT 6 Clicks Mobility  Outcome Measure Help needed turning from your back to your side while in a flat bed without using bedrails?: A Little Help needed moving from lying on your back to sitting on the side of a flat bed without using bedrails?: A Little Help needed moving to and from a bed to a chair (including a wheelchair)?: A Little Help needed standing up from a chair using your arms (e.g., wheelchair or bedside chair)?: A Little Help needed to walk in hospital room?: A Little Help needed climbing 3-5 steps with a railing? : A Lot 6 Click Score: 17    End of Session Equipment Utilized During Treatment: Gait belt;Oxygen  Activity Tolerance: Patient tolerated treatment well;Patient limited by fatigue Patient left: in chair;with call bell/phone within reach Nurse Communication: Mobility status;Other (comment) PT Visit Diagnosis: Unsteadiness on feet (R26.81);Muscle weakness (generalized) (  M62.81)    Time: 9070-9050 PT Time Calculation (min) (ACUTE ONLY): 20 min   Charges:   PT Evaluation $PT Eval Low Complexity: 1 Low   PT General Charges $$ ACUTE PT VISIT: 1 Visit         Norene Ames, PT, DPT Acute Rehabilitation Services Secure chat preferred Office #: [phone]   Norene CHRISTELLA Ames 12/22/2023, 10:04 AM

## 2023-12-22 NOTE — Assessment & Plan Note (Addendum)
 Patient remained sinus rhythm on telemetry monitoring.  He is not on AV blocker or anticoagulation. (He declined anticoagulation)

## 2023-12-22 NOTE — Assessment & Plan Note (Signed)
No chest pain, no acute coronary syndrome.  

## 2023-12-22 NOTE — Progress Notes (Signed)
   Heart Failure Stewardship Pharmacist Progress Note  PCP: Katrinka Garnette KIDD, MD PCP-Cardiologist: Candyce Reek, MD   HPI:  68 YOF with PMH chronic diastolic HF, chronic hypoxic respiratory failure, COVID associated pulmonary fibrosis, paroxysmal Afib, COPD, obesity, OSA.  Presented to ED with SOB x 2 days.  ECHO showed LVEF 55-60%, grade I diastolic dysfunction.  Previous ECHO 08/2023 showed LVEF 65-70%.  On exam, patient denies chest pain / tightness. States he gets SOB when getting up to go to bathroom but is ok at rest. Reports his wife handles most of his medications. Tried calling mobile / home to reach her but went to voicemail both times.   Current HF Medications: Diuretic: furosemide  40 mg IV BID MRA: spironolactone 25 mg daily  Prior to admission HF Medications: Diuretic: furosemide  40 mg PO BID  Pertinent Lab Values: Serum creatinine 1.13, BUN 14, Potassium 3.6, Sodium 135, BNP 92.4  Vital Signs: Weight: admission weight: 231 lbs - incomplete Blood pressure: 105-120/70  Heart rate: 78 (67-86)  I/O: net -0.9L since admission - incomplete  Medication Assistance / Insurance Benefits Check: Does the patient have prescription insurance?  Yes Type of insurance plan: [employer]  Does the patient qualify for medication assistance through manufacturers or grants?   Yes Eligible grants and/or patient assistance programs: Orthoptist Medication assistance applications in progress: None  Medication assistance applications approved: None Approved medication assistance renewals will be completed by: Pending  Outpatient Pharmacy:  Prior to admission outpatient pharmacy: [facility] [address]  Is the patient willing to use [facility] TOC pharmacy at discharge? Pending Is the patient willing to transition their outpatient pharmacy to utilize a [facility] outpatient pharmacy?   Pending  Assessment: 1. 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 NYHA class III symptoms. - Continue furosemide  40 mg IV BID - Continue spironolactone 25 mg daily   Plan: 1) Medication changes recommended at this time: - Recommend 40 mEq potassium PO x 1 - Magnesium  labs - Consider Jardiance 10 mg daily  2) Patient assistance: - $255 deductible for Jardiance -- $47 co-pay afterwards.  - Patient states this would not be affordable and would like patient assistance.  - [employer] Grant for [employer]  3)  Education  - Initial education provided  - To be completed prior to discharge  Bernardino George, PharmD Candidate 2026 [employer] 12/22/2023 2:40 PM

## 2023-12-22 NOTE — Telephone Encounter (Signed)
 Advanced Heart Failure Patient Advocate Encounter  The patient was approved for a [employer] grant that will help cover the cost of Entresto, Jardiance.  Total amount awarded, $4,500.  Effective: 11/22/2023 - 11/20/2024.  BIN [id number] PCN [id number] Group [id number] ID [id number]  This application requires diagnosis verification before funds can be accessed. Verification form uploaded 12/22/23 for review. Processing information added to GAILA Rachel DEL, CPhT Rx Patient Advocate Phone: [phone]

## 2023-12-22 NOTE — Progress Notes (Signed)
  Progress Note   Patient: Wayne Booth FMW:[id number] DOB: 1940/08/23 DOA: 12/20/2023     1 DOS: the patient was seen and examined on 12/22/2023   Brief hospital course: Mr. Stelly was admitted to the hospital with the working diagnosis of heart failure exacerbation.   [name] with chronic diastolic CHF, chronic hypoxic respiratory failure on 5 L home O2, COPD, COVID associated pulmonary fibrosis, paroxysmal A-fib not on anticoagulation, morbid obesity, OSA intolerant of CPAP presented to the ED with worsening dyspnea for 2 to 3 days, patient reported poor compliance with Lasix  and CPAP. - In the ER he was tachypneic in respiratory distress, troponin 5, BNP 92, creatinine normal at 0.8 EKG in sinus rhythm, chest x-ray with cardiomegaly pulmonary vascular congestion, bilateral lower lobe opacities could reflect edema or infection, small bilateral effusions  Assessment and Plan: * Acute on chronic diastolic CHF (congestive heart failure) (HCC) Echocardiogram with preserved LV systolic function with EF 55 to 60%, RV systolic function preserved, no significant valvular disease.   Continue with hypervolemia.  Systolic blood pressure 110 mmHg range   Plan to continue diuresis with furosemide . Spironolactone and add SGLT 2 inh.   Follow up chest radiograph with hypoinflation, cardiomegaly, and persistent bilateral lower lobe interstitial infiltrates.   Essential hypertension Continue blood pressure control with spironolactone Continue diuresis   Paroxysmal atrial fibrillation (HCC) Continue telemetry monitoring.  Not on anticoagulation   CKD (chronic kidney disease) stage 3, GFR 30-59 ml/min (HCC) Renal function with serum cr at 1.13 with K at 3,6 and serum bicarbonate at 31  Na 135   Continue diuresis and follow up renal function and electrolytes in am.   CAD (coronary artery disease) No chest pain, no acute coronary syndrome   History of COPD No signs of acute exacerbation Chronic  hypoxemia, on supplemental 02 at home per [place] 5 L/min   GAD (generalized anxiety disorder) Continue with escitalopram .   Obstructive sleep apnea Cpap   Gout No acute flare   Class 1 obesity Calculated BMI 32.2         Subjective: Patient with improvement in his symptoms but not yet back to baseline, continue to have dyspnea on exertion., he uses supplemental 02 at home 5 l/min per [place]   Physical Exam: Vitals:   12/22/23 0640 12/22/23 0857 12/22/23 1118 12/22/23 1556  BP: 107/71  112/69 121/68  Pulse: 65 68 75   Resp: 18 18 20 19   Temp: 97.8 F (36.6 C)  97.9 F (36.6 C) 97.8 F (36.6 C)  TempSrc: Oral  Oral Oral  SpO2: 100%  99% 98%  Weight:      Height:       Neurology awake and alert ENT with mild pallor Cardiovascular with S1 and S2 present and regular with no gallops, rubs or murmurs Respiratory with rales bilaterally with scattered rhonchi, prolonged expiratory phase with no wheezing Abdomen with no distention  No lower extremity edema  Data Reviewed:    Family Communication: no family at the bedside   Disposition: Status is: Inpatient Remains inpatient appropriate because: IV diuresis   Planned Discharge Destination: Home    Author: Elidia Toribio Furnace, MD 12/22/2023 6:35 PM  For on call review [email or web address].

## 2023-12-22 NOTE — Assessment & Plan Note (Signed)
 Cpap

## 2023-12-22 NOTE — TOC CM/SW Note (Signed)
 Transition of Care [facility]) - Inpatient Brief Assessment   Patient Details  Name: Wayne Booth MRN: [id number] Date of Birth: [birth year]  [facility]) CM/SW Contact:    Waddell Barnie Rama, RN Phone Number: 12/22/2023, 5:02 PM   Clinical Narrative: From home with spouse, has PCP and insurance on file, states has  [facility] services in place  with [facility] and would like to continue, This NCM confirmed with Jon and she states he has HHRn, HHAIDE , they will add HHPT to the services, has no DME at home.  States family member will transport them home at [employer] and family is support system, states gets medications from [facility] on [address].  Pta self ambulatory. He has a scale for daily weights.    Transition of Care Asessment: Insurance and Status: Insurance coverage has been reviewed Patient has primary care physician: Yes Home environment has been reviewed: home with wife Prior level of function:: indep Prior/Current Home Services: Current home services (active with [facility] for [facility], HHAIDE,) Social Drivers of Health Review: SDOH reviewed no interventions necessary Readmission risk has been reviewed: Yes Transition of care needs: transition of care needs identified, TOC will continue to follow

## 2023-12-22 NOTE — Assessment & Plan Note (Addendum)
 Calculated BMI 32.2   Chronic pain syndrome, continue oral analgesics as home and follow up as outpatient.  Obesity hypoventilation syndrome.

## 2023-12-22 NOTE — Assessment & Plan Note (Addendum)
 Continue blood pressure control with spironolactone . Possible addition of ace inh or arb as outpatient.

## 2023-12-22 NOTE — Assessment & Plan Note (Signed)
Continue with escitalopram.  ?

## 2023-12-22 NOTE — Progress Notes (Signed)
 Heart Failure Nurse Navigator Progress Note  PCP: Katrinka Garnette KIDD, MD PCP-Cardiologist: None Admission Diagnosis: Acute on chronic congestive heart failure Admitted from: Home via EMS  Presentation:   Wayne Booth presented with shortness of breath for 2 days , then with sudden onset of nausea, reported to his lasix  was changed from daily to PRN recently. Wears 5 L of oxygen  at baseline and normally sleeps in a chair at night. BP 140/82, HR 72, BNP 92.4, EKG in sinus rhythm, CXR with cardiomegaly, pulmonary vascular congestion, bilateral lower lobe opacities, small bilateral effusions. A Palliative care consult was completed and patient and wife would like to continue as full code and all measures to be done. Patient stated he wants to do right and try to help himself get better and try and stay out of the hospital moving forward.   Patient was educated on the sign and symptoms of heart failure, daily weights, when to call his doctor or go to the ED, Diet/ fluid restrictions, and taking all medications as prescribed, and attending all medical appointments. Patient verbalized his understanding of all education. A HF TOC appointment was scheduled for 12/29/2023 @ 2 pm.   ECHO/ LVEF: 55-60%   Clinical Course:  Past Medical History:  Diagnosis Date   Allergy    States his nose runs when he is around grease.    Anxiety    Arthritis    Atrial fibrillation (HCC)    Cancer (HCC)    Coronary artery disease    History of total knee replacement    Hypertension    Hypothyroidism    Kidney cancer, primary, with metastasis from kidney to other site [facility])    Kidney stone    OSA (obstructive sleep apnea)    pt does not wear cpap at night   Pituitary cyst (HCC)    Pneumonia    hx of   Prostate cancer (HCC)      Social History   Socioeconomic History   Marital status: Married    Spouse name: Elijah   Number of children: Y   Years of education: Not on file   Highest education level: Not  on file  Occupational History   Occupation: retired Engineer, site: RETIRED  Tobacco Use   Smoking status: Never   Smokeless tobacco: Never  Vaping Use   Vaping status: Never Used  Substance and Sexual Activity   Alcohol  use: Yes    Alcohol /week: 2.0 standard drinks of alcohol     Types: 2 Shots of liquor per week    Comment: socially   Drug use: No   Sexual activity: Yes  Other Topics Concern   Not on file  Social History Narrative   Married 1965 (wife patient of Dr. Elaine). 2 sons. 4 granddaughters.       Retired Investment banker, corporate.       Hobbies: golf, fishing, formerly hunting.    Social Drivers of Corporate investment banker Strain: Low Risk  (01/04/2023)   Overall Financial Resource Strain (CARDIA)    Difficulty of Paying Living Expenses: Not hard at all  Food Insecurity: No Food Insecurity (12/21/2023)   Hunger Vital Sign    Worried About Running Out of Food in the Last Year: Never true    Ran Out of Food in the Last Year: Never true  Transportation Needs: No Transportation Needs (12/21/2023)   PRAPARE - Transportation    Lack of Transportation (Medical): No    Lack  of Transportation (Non-Medical): No  Physical Activity: Inactive (01/04/2023)   Exercise Vital Sign    Days of Exercise per Week: 0 days    Minutes of Exercise per Session: 0 min  Stress: No Stress Concern Present (01/04/2023)   [employer] of Occupational Health - Occupational Stress Questionnaire    Feeling of Stress : Not at all  Social Connections: Moderately Integrated (12/21/2023)   Social Connection and Isolation Panel    Frequency of Communication with Friends and Family: More than three times a week    Frequency of Social Gatherings with Friends and Family: More than three times a week    Attends Religious Services: 1 to 4 times per year    Active Member of [employer] or Organizations: No    Attends Engineer, structural: Never    Marital Status: Married    Water engineer and Provision:  Detailed education and instructions provided on heart failure disease management including the following:  Signs and symptoms of Heart Failure When to call the physician Importance of daily weights Low sodium diet Fluid restriction Medication management Anticipated future follow-up appointments  Patient education given on each of the above topics.  Patient acknowledges understanding via teach back method and acceptance of all instructions.  Education Materials:  Living Better With Heart Failure Booklet, HF zone tool, & Daily Weight Tracker Tool.  Patient has scale at home: Yes Patient has pill box at home: Yes    High Risk Criteria for Readmission and/or Poor Patient Outcomes: Heart failure hospital admissions (last 6 months):  1 No Show rate: 16 % Difficult social situation: No, lives with his wife  Demonstrates medication adherence: per patient , his wife makes up his medications and he takes them daily.   Primary Language: English  Literacy level: Reading, writing, and comprehension  Barriers of Care:   Medication and diet compliance Diet/ fluid restrictions/ daily weights  Considerations/Referrals:   Referral made to Heart Failure Pharmacist Stewardship: Yes Referral made to Heart Failure CSW/NCM TOC: NA Referral made to Heart & Vascular TOC clinic: Yes, 12/29/2023 @ 2 pm.   Items for Follow-up on DC/TOC: Continued HF education Medication/ diet compliance Diet/ fluid restrictions/ daily weights.    Stephane Haddock, BSN, Scientist, clinical (histocompatibility and immunogenetics) Only

## 2023-12-22 NOTE — Progress Notes (Addendum)
 Daily Progress Note   Patient Name: Wayne Booth       Date: 12/22/2023 DOB: 03/06/1941  Age: 83 y.o. MRN#: [id number] Attending Physician: Noralee Elidia Toribio DEWAINE Primary Care Physician: Katrinka Garnette KIDD, MD Admit Date: 12/20/2023  Reason for Consultation/Follow-up: Establishing goals of care  Subjective: Medical records reviewed including progress notes, labs and imaging. Patient assessed at the bedside. He is sitting in bedside chair. No family present during my visit.  Patient shares that he has not had the chance to read Hard Choices booklet and would prefer to have his wife present to continue discussions about code status etc. He asked me to call her and see if there was a time tomorrow we could all speak together. He did not recall her stating that she would be here at 3pm during our call yesterday. Encouraged patient to reflect on his preferences and review provided resources.  Attempted to call wife Ezzie but was unable to reach. I left voicemail with request for return call.  Questions and concerns addressed. PMT will continue to support holistically.   Length of Stay: 1   Physical Exam Vitals and nursing note reviewed.  Constitutional:      General: He is not in acute distress. HENT:     Head: Normocephalic and atraumatic.   Cardiovascular:     Rate and Rhythm: Normal rate.  Pulmonary:     Effort: Pulmonary effort is normal.   Neurological:     Mental Status: He is alert and oriented to person, place, and time.   Psychiatric:        Mood and Affect: Mood normal.             Vital Signs: BP 107/71 (BP Location: Left Arm)   Pulse 65   Temp 97.8 F (36.6 C) (Oral)   Resp 18   Ht 5' 11 (1.803 m)   Wt 105 kg   SpO2 100%   BMI 32.27 kg/m  SpO2: SpO2: 100 % O2 Device: O2 Device: Nasal Cannula O2 Flow  Rate: O2 Flow Rate (L/min): 5 L/min      Palliative Assessment/Data:   Palliative Care Assessment & Plan   Patient Profile: 83 y.o. male  with past medical history of chronic diastolic heart failure preserved EF 65 to 70%, chronic hypoxic respiratory failure 5 L oxygen  at baseline, COVID associated pulmonary fibrosis, history paroxysmal atrial fibrillation not on anticoagulation-patient refused anticoagulation in the past, COPD, morbid obesity, OSA and chronic physical debility admitted on 12/20/2023 with worsening dyspnea for 2 to 3 days.    Patient admitted for acute on chronic diastolic heart failure exacerbation and acute on chronic hypoxic respiratory failure.  Patient has been hospitalized 4 times in the past 6 months.  He has had poor compliance with medications.  PMT has been consulted to assist with goals of care conversation.  Assessment: Goals of care conversation Acute on chronic diastolic CHF Acute on chronic respiratory failure   Recommendations/Plan: Continue full code/full scope treatment Patient's goal is to improve compliance and stay out of the hospital, get healthier  Continue efforts at [facility] conversations. Left voicemail for patient's wife today Advance directives completed during hospitalization if able to coordinate with  patient's wife PMT will continue to follow   Prognosis:  Unable to determine  Discharge Planning: Home with Home Health  Care plan was discussed with patient   Total time: I spent 25 minutes in the care of the patient today in the above activities and documenting the encounter.         Rayane Gallardo P Broghan Pannone, PA-C  Palliative Medicine Team Team phone # [phone]  Thank you for allowing the Palliative Medicine Team to assist in the care of this patient. Please utilize secure chat with additional questions, if there is no response within 30 minutes please call the above phone number.  Palliative Medicine Team providers are available by  phone from 7am to 7pm daily and can be reached through the team cell phone.  Should this patient require assistance outside of these hours, please call the patient's attending physician.

## 2023-12-22 NOTE — Assessment & Plan Note (Addendum)
 Hyponatremia, AKI   Volume has improved, at the time of his discharge his serum cr is 1,14 from 1,43, with K at 4,2 and serum bicarbonate at 31  Na 133 Mg 2.1   Continue furosemide  and spironolactone , follow up renal function and electrolytes as outpatient.

## 2023-12-22 NOTE — Assessment & Plan Note (Signed)
No acute flare.  

## 2023-12-22 NOTE — Hospital Course (Addendum)
 Wayne Booth was admitted to the hospital with the working diagnosis of heart failure exacerbation.   [name] with chronic diastolic CHF, chronic hypoxic respiratory failure on 5 L home O2, COPD, COVID associated pulmonary fibrosis, paroxysmal A-fib, obesity, and OSA who presented with worsening dyspnea for 2 to 3 days, patient reported poor compliance with diuretic therapy at home.  Recent hospitalization 06/01 to 06/07 due to heart failure exacerbation. He declined SNF and was discharge with instruction to take 40 mg furosemide  daily, but he was taking only as needed.  On arrival to the ED he was in respiratory distress and was placed on Cpap.  On hi initial physical examination his blood pressure was 140/82, HR 72, RR 31 and 02 saturation 100% on supplemental 02 per Cpap.  Lungs with coarse breath sounds with bilateral rales with no wheezing, heart with S1 and S2 present with no gallops or rubs, abdomen with no distention and positive lower extremity edema.   VBG 7,41/ 48/ 81/ 31/ 96%  Na 137, K 4,5 Cl 104 bicarbonate 27 glucose 84 bun 10 cr 0,85  AST 29 ALTT 22  High sensitive troponin 5 and 6  Wbc 6.6 hgb 13.8 plt 352   SARS covid 19 negative RSV negative  Influenza negative   Chest radiograph with hypoinflation, positive cardiomegaly, bilateral hilar vascular congestion, bilateral small pleural effusions.   EKG 77 bpm, left axis deviation, normal intervals, qtc 432 sinus rhythm with q wave V1 and V2, with no significant ST segment or T wave changes.   Patient was placed on IV furosemide  with improvement in his symptoms.  Transitory increased in serum cr.  Placed on SGLT 2 inh His renal function has improved along with his volume status.  Plan to continue medical therapy at home with close follow up as outpatient.

## 2023-12-23 DIAGNOSIS — I480 Paroxysmal atrial fibrillation: Secondary | ICD-10-CM | POA: Diagnosis not present

## 2023-12-23 DIAGNOSIS — Z7189 Other specified counseling: Secondary | ICD-10-CM | POA: Diagnosis not present

## 2023-12-23 DIAGNOSIS — I5033 Acute on chronic diastolic (congestive) heart failure: Secondary | ICD-10-CM | POA: Diagnosis not present

## 2023-12-23 DIAGNOSIS — N183 Chronic kidney disease, stage 3 unspecified: Secondary | ICD-10-CM | POA: Diagnosis not present

## 2023-12-23 DIAGNOSIS — I10 Essential (primary) hypertension: Secondary | ICD-10-CM | POA: Diagnosis not present

## 2023-12-23 DIAGNOSIS — Z515 Encounter for palliative care: Secondary | ICD-10-CM | POA: Diagnosis not present

## 2023-12-23 LAB — BASIC METABOLIC PANEL WITH GFR
Anion gap: 4 — ABNORMAL LOW (ref 5–15)
BUN: 20 mg/dL (ref 8–23)
CO2: 35 mmol/L — ABNORMAL HIGH (ref 22–32)
Calcium: 8.5 mg/dL — ABNORMAL LOW (ref 8.9–10.3)
Chloride: 94 mmol/L — ABNORMAL LOW (ref 98–111)
Creatinine, Ser: 1.43 mg/dL — ABNORMAL HIGH (ref 0.61–1.24)
GFR, Estimated: 49 mL/min — ABNORMAL LOW (ref >60)
Glucose, Bld: 95 mg/dL (ref 70–99)
Potassium: 4.2 mmol/L (ref 3.5–5.1)
Sodium: 133 mmol/L — ABNORMAL LOW (ref 135–145)

## 2023-12-23 LAB — MAGNESIUM: Magnesium: 2.1 mg/dL (ref 1.7–2.4)

## 2023-12-23 MED ORDER — TRAZODONE HCL 50 MG PO TABS
50.0000 mg | ORAL_TABLET | Freq: Once | ORAL | Status: AC
  Administered 2023-12-23: 50 mg via ORAL
  Filled 2023-12-23: qty 1

## 2023-12-23 NOTE — Progress Notes (Signed)
 Mobility Specialist Progress Note:   12/23/23 0936  Mobility  Activity Ambulated with assistance in hallway  Level of Assistance Contact guard assist, steadying assist  Assistive Device Front wheel walker  Distance Ambulated (ft) 100 ft  Activity Response Tolerated fair  Mobility Referral Yes  Mobility visit 1 Mobility  Mobility Specialist Start Time (ACUTE ONLY) [id number]  Mobility Specialist Stop Time (ACUTE ONLY) 0955  Mobility Specialist Time Calculation (min) (ACUTE ONLY) 19 min   Pt agreeable to session. Initially ambulated to bathroom from recliner before coming into hall. Pt slight SOB before increasing to 6L [place]. Shortly after, pt asked to turn back. Left back in recliner w/ no c/o symptoms and all needs met.   Pre Mobility:Recliner SpO2 94% 5L [place] During Mobility: SpO2 90% 6L  Post Mobility:Recliner SpO2 90% 5L   Therisa Rana Mobility Specialist Please contact via Special educational needs teacher or  Rehab office at [phone]

## 2023-12-23 NOTE — Progress Notes (Signed)
 This chaplain received an update from PMT PA-Josseline on the creating/updating of the Pt. Advance Directive. This chaplain understands AD education will be revisited at another time.   This chaplain is available for F/U spiritual care at another time.  Chaplain Leeroy Hummer [phone]

## 2023-12-23 NOTE — Plan of Care (Signed)
  Problem: Education: Goal: Knowledge of General Education information will improve Description: Including pain rating scale, medication(s)/side effects and non-pharmacologic comfort measures Outcome: Not Progressing   Problem: Health Behavior/Discharge Planning: Goal: Ability to manage health-related needs will improve Outcome: Not Progressing   Problem: Clinical Measurements: Goal: Ability to maintain clinical measurements within normal limits will improve Outcome: Not Progressing Goal: Will remain free from infection Outcome: Not Progressing Goal: Diagnostic test results will improve Outcome: Not Progressing Goal: Respiratory complications will improve Outcome: Not Progressing Goal: Cardiovascular complication will be avoided Outcome: Not Progressing   Problem: Activity: Goal: Risk for activity intolerance will decrease Outcome: Not Progressing   Problem: Nutrition: Goal: Adequate nutrition will be maintained Outcome: Not Progressing   Problem: Coping: Goal: Level of anxiety will decrease Outcome: Not Progressing   Problem: Elimination: Goal: Will not experience complications related to bowel motility Outcome: Not Progressing Goal: Will not experience complications related to urinary retention Outcome: Not Progressing   Problem: Pain Managment: Goal: General experience of comfort will improve and/or be controlled Outcome: Not Progressing   Problem: Safety: Goal: Ability to remain free from injury will improve Outcome: Not Progressing   Problem: Skin Integrity: Goal: Risk for impaired skin integrity will decrease Outcome: Not Progressing   Problem: Education: Goal: Ability to demonstrate management of disease process will improve Outcome: Not Progressing Goal: Ability to verbalize understanding of medication therapies will improve Outcome: Not Progressing Goal: Individualized Educational Video(s) Outcome: Not Progressing   Problem: Activity: Goal:  Capacity to carry out activities will improve Outcome: Not Progressing   Problem: Cardiac: Goal: Ability to achieve and maintain adequate cardiopulmonary perfusion will improve Outcome: Not Progressing

## 2023-12-23 NOTE — Progress Notes (Signed)
   Heart Failure Stewardship Pharmacist Progress Note  PCP: Katrinka Garnette KIDD, MD PCP-Cardiologist: Candyce Reek, MD   HPI:  4 YOF with PMH chronic diastolic HF, chronic hypoxic respiratory failure, COVID associated pulmonary fibrosis, paroxysmal Afib, COPD, obesity, OSA.  Presented to ED with SOB x 2 days.  ECHO showed LVEF 55-60%, grade I diastolic dysfunction.  Previous ECHO 08/2023 showed LVEF 65-70%.  Reports his wife handles most of his medications.  On exam, patient denied chest pain / tightness, SOB. No lower extremity swelling present.  Let patient and wife know about [employer] grant for medication coverage and they verbalized understanding.    Current HF Medications: MRA: spironolactone 25 mg daily SGLT2i: Jardiance 10 mg daily  Prior to admission HF Medications: Diuretic: furosemide  40 mg PO BID  Pertinent Lab Values: Serum creatinine 1.13>1.43, BUN 20, Potassium 4.2, Sodium 133, BNP 92.4, Magnesium  2.1  Vital Signs: Weight: 231 lbs today; (admission weight: 231 lbs - incomplete) Blood pressure: 90-120/60's Heart rate: 85 (73-92) I/O: net -0.48 yesterday; net -1.6L since admission - incomplete  Medication Assistance / Insurance Benefits Check: Does the patient have prescription insurance?  Yes Type of insurance plan: [employer]  Does the patient qualify for medication assistance through manufacturers or grants?   Yes Eligible grants and/or patient assistance programs: Orthoptist Medication assistance applications in progress: None  Medication assistance applications approved: Orthoptist  Approved medication assistance renewals will be completed by: [facility]  Outpatient Pharmacy:  Prior to admission outpatient pharmacy: [facility]  Is the patient willing to use [facility] TOC pharmacy at discharge? Yes Is the patient willing to transition their outpatient pharmacy to utilize a [facility] outpatient pharmacy?    No  Assessment: 1. 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 NYHA class II/III symptoms. - Continue spironolactone 25 mg daily - Continue Jardiance 10 mg daily - Daily weight. Strict I/O's.  - Keep K > 4, Mg > 2   Plan: 1) Medication changes recommended at this time: - No changes at this time.   2) Patient assistance: - $255 deductible for Jardiance -- $47 co-pay afterwards.  - Patient states this would not be affordable and would like patient assistance.  - [employer] Grant for Jardiance / [employer] - $4,500 - Educated patient on grant and he expressed understanding. Would like meds to be filled at normal [employer].  3)  Education  - Initial education provided  - To be completed prior to discharge  Bernardino George, PharmD Candidate 2026 [employer] 12/23/2023 2:06 PM

## 2023-12-23 NOTE — Progress Notes (Addendum)
 Progress Note   Patient: Wayne Booth FMW:[id number] DOB: [birth year] DOA: 12/20/2023     2 DOS: the patient was seen and examined on 12/23/2023   Brief hospital course: [name] was admitted to the hospital with the working diagnosis of heart failure exacerbation.   [name] with chronic diastolic CHF, chronic hypoxic respiratory failure on 5 L home O2, COPD, COVID associated pulmonary fibrosis, paroxysmal A-fib not on anticoagulation, obesity, OSA who presented with worsening dyspnea for 2 to 3 days, patient reported poor compliance with diuretic therapy at home.  Recent hospitalization 06/01 to 06/07 due to heart failure exacerbation. He declined SNF and was discharge with instruction to take 40 mg furosemide  daily.  On arrival to the ED he was in respiratory distress and was placed on Cpap.  On hi initial physical examination his blood pressure was 140/82, HR 72, RR 31 and 02 saturation 100% on supplemental 02 per Cpap.  Lungs with coarse breath sounds with bilateral rales with no wheezing, heart with S1 and S2 present with no gallops or rubs, abdomen with no distention and positive lower extremity edema.   VBG 7,41/ 48/ 81/ 31/ 96%  Na 137, K 4,5 Cl 104 bicarbonate 27 glucose 84 bun 10 cr 0,85  AST 29 ALTT 22  High sensitive troponin 5 and 6  Wbc 6.6 hgb 13.8 plt 352   SARS covid 19 negative RSV negative  Influenza negative   Chest radiograph with hypoinflation, positive cardiomegaly, bilateral hilar vascular congestion, bilateral small pleural effusions.   EKG 77 bpm, left axis deviation, normal intervals, qtc 432 sinus rhythm with q wave V1 and V2, with no significant ST segment or T wave changes.     Assessment and Plan: * Acute on chronic diastolic CHF (congestive heart failure) (HCC) Echocardiogram with preserved LV systolic function with EF 55 to 60%, RV systolic function preserved, no significant valvular disease.   Urine output documented 1200 ml  Systolic blood  pressure 100 mmHg range   Continue with Spironolactone and SGLT 2 inh.  Hold on loop diuretic today.   Follow up chest radiograph with hypoinflation, cardiomegaly, and persistent bilateral lower lobe interstitial infiltrates.   Essential hypertension Continue blood pressure control with spironolactone.  Paroxysmal atrial fibrillation (HCC) Continue telemetry monitoring.  Not on anticoagulation   CKD (chronic kidney disease) stage 2, GFR 60-89 ml/min Hyponatremia, AKI   Improved volume status, renal function with worsening serum cr at 1,43 with K at 4,2 and bicarbonate at 35  Na 133   Hold on loop diuretic, continue with spironolactone and SGLT 2 inh Follow up renal function and electrolytes in am Avoid hypotension or nephrotoxic medications   CAD (coronary artery disease) No chest pain, no acute coronary syndrome   History of COPD No signs of acute exacerbation Chronic hypoxemia, on supplemental 02 at home per [place] 5 L/min   GAD (generalized anxiety disorder) Continue with escitalopram .   Obstructive sleep apnea Cpap   Gout No acute flare   Class 1 obesity Calculated BMI 32.2       Subjective: Patient is feeling better, dyspnea has improved, edema has resolved. No chest pain. Today sitting in the chair at the side of the bed.   Physical Exam: Vitals:   12/23/23 0058 12/23/23 0639 12/23/23 0800 12/23/23 0831  BP: 114/69 91/61    Pulse: 78 69 72 70  Resp: 18 18 18 16   Temp: 97.7 F (36.5 C) 97.7 F (36.5 C) 98.1 F (36.7 C)   TempSrc:  Oral Oral Oral   SpO2: 96% 100%    Weight:  104 kg    Height:       Neurology awake and alert ENT with mild pallor Cardiovascular with S1 and S2 present and regular with no gallops or rubs, no murmurs No JVD Respiratory with no rales or wheezing, noted prolonged expiratory phase.  Abdomen protuberant with no distention, non tender No lower extremity edema  Data Reviewed:    Family Communication: no family at the  bedside   Disposition: Status is: Inpatient Remains inpatient appropriate because: recovery renal function   Planned Discharge Destination: Home    Author: Elidia Toribio Furnace, MD 12/23/2023 2:58 PM  For on call review [email or web address].

## 2023-12-23 NOTE — Progress Notes (Addendum)
 Daily Progress Note   Patient Name: Wayne Booth       Date: 12/23/2023 DOB: Feb 21, 1941  Age: 83 y.o. MRN#: [id number] Attending Physician: Wayne Booth Primary Care Physician: Wayne Garnette KIDD, MD Admit Date: 12/20/2023  Reason for Consultation/Follow-up: Establishing goals of care  Subjective: Medical records reviewed including progress notes, labs and imaging. Attempted to call patient's wife Wayne Booth but was unable to reach again.  Left another voicemail requesting return call.  She then returned my call, sharing that she is feeling sick and may not be able to come to the bedside today.  I shared that I would speak with patient about possibility of outpatient palliative care referral to continue the conversation when she is feeling better and he is back at home.  She will verbalized understanding.  Patient assessed at the bedside. He reports dyspnea at baseline has improved, though he has not tried to get up yet. 5L [place]. No family present during my visit.  Provided him with update on my call with his wife and that she would likely not be able to come for continued goals of care discussions today.  Recommended outpatient palliative care and explained importance of continued conversation with family and the medical providers regarding overall plan of care and treatment options, ensuring decisions are within the context of the patient's values and GOCs.  Patient is agreeable as long as he can continue his home health therapy, though he still doubts heart failure diagnosis.  Questions and concerns addressed. PMT will continue to support holistically.   Length of Stay: 2   Physical Exam Vitals and nursing note reviewed.  Constitutional:      General: He is not in acute distress. HENT:     Head: Normocephalic and atraumatic.    Cardiovascular:     Rate and Rhythm: Normal rate.  Pulmonary:     Effort: Pulmonary effort is normal.   Neurological:     Mental Status: He is alert and oriented to person, place, and time.   Psychiatric:        Mood and Affect: Mood normal.            Vital Signs: BP 91/61 (BP Location: Left Arm)   Pulse 70   Temp 98.1 F (36.7 C) (Oral)   Resp 16   Ht 5' 11 (1.803 m)   Wt 104 kg   SpO2 100%   BMI 31.98 kg/m  SpO2: SpO2: 100 % O2 Device: O2 Device: Nasal Cannula O2 Flow Rate: O2 Flow Rate (L/min): 5 L/min      Palliative Assessment/Data: 50-60%   Palliative Care Assessment & Plan   Patient Profile: 83 y.o. male  with past medical history of chronic diastolic heart failure preserved EF 65 to 70%, chronic hypoxic respiratory failure 5 L oxygen  at baseline, COVID associated pulmonary fibrosis, history paroxysmal atrial fibrillation not on anticoagulation-patient refused anticoagulation in the past, COPD, morbid obesity, OSA and chronic physical debility admitted on 12/20/2023 with worsening dyspnea for 2 to 3 days.    Patient admitted for acute on chronic diastolic heart failure exacerbation and acute on chronic hypoxic respiratory failure.  Patient has been hospitalized 4 times in the past 6  months.  He has had poor compliance with medications.  PMT has been consulted to assist with goals of care conversation.  Assessment: Goals of care conversation Acute on chronic diastolic CHF Acute on chronic respiratory failure   Recommendations/Plan: Continue full code/full scope treatment Patient's goal is to return home with home health, improve compliance and stay out of the hospital, get healthier and decrease symptom burden GOC documented in [facility] [name] is not feeling well and unable to come for continue goals of care conversations or advance directives TOC consulted for outpatient palliative care referral, assistance appreciated PMT will remain available as  needed   Prognosis:  Unable to determine  Discharge Planning: Home with Home Health  Care plan was discussed with patient, patient's wife   Total time: I spent 25 minutes in the care of the patient today in the above activities and documenting the encounter.         Wayne Blassingame P Gleen Ripberger, PA-C  Palliative Medicine Team Team phone # [phone]  Thank you for allowing the Palliative Medicine Team to assist in the care of this patient. Please utilize secure chat with additional questions, if there is no response within 30 minutes please call the above phone number.  Palliative Medicine Team providers are available by phone from 7am to 7pm daily and can be reached through the team cell phone.  Should this patient require assistance outside of these hours, please call the patient's attending physician.

## 2023-12-23 NOTE — TOC Progression Note (Signed)
 Transition of Care [facility]) - Progression Note    Patient Details  Name: Wayne Booth MRN: [id number] Date of Birth: 1940/11/24  Transition of Care [facility]) CM/SW Contact  Waddell Barnie Rama, RN Phone Number: 12/23/2023, 12:47 PM  Clinical Narrative:    Per Jon with [facility] , they can do the [facility]  and the HHAIDE, but can not do the HHPT, secondary  to some  inappropriate  behaviors reported by [facility] agency.          Expected Discharge Plan and Services                                               Social Determinants of Health (SDOH) Interventions SDOH Screenings   Food Insecurity: No Food Insecurity (12/21/2023)  Housing: Unknown (12/22/2023)  Transportation Needs: No Transportation Needs (12/22/2023)  Utilities: Not At Risk (12/21/2023)  Alcohol  Screen: Low Risk  (12/22/2023)  Depression (PHQ2-9): High Risk (02/11/2023)  Financial Resource Strain: Low Risk  (12/22/2023)  Physical Activity: Inactive (01/04/2023)  Social Connections: Moderately Integrated (12/21/2023)  Stress: No Stress Concern Present (01/04/2023)  Tobacco Use: Low Risk  (12/20/2023)    Readmission Risk Interventions    12/22/2023    5:01 PM 10/20/2023   11:54 AM  Readmission Risk Prevention Plan  Transportation Screening Complete Complete  PCP or Specialist Appt within 5-7 Days  Complete  PCP or Specialist Appt within 3-5 Days Complete   Home Care Screening  Complete  Medication Review (RN CM)  Complete  HRI or Home Care Consult Complete   Palliative Care Screening Not Applicable   Medication Review (RN Care Manager) Complete

## 2023-12-24 ENCOUNTER — Other Ambulatory Visit (HOSPITAL_COMMUNITY): Payer: Self-pay

## 2023-12-24 DIAGNOSIS — N182 Chronic kidney disease, stage 2 (mild): Secondary | ICD-10-CM

## 2023-12-24 DIAGNOSIS — I5033 Acute on chronic diastolic (congestive) heart failure: Secondary | ICD-10-CM | POA: Diagnosis not present

## 2023-12-24 DIAGNOSIS — I10 Essential (primary) hypertension: Secondary | ICD-10-CM | POA: Diagnosis not present

## 2023-12-24 DIAGNOSIS — I480 Paroxysmal atrial fibrillation: Secondary | ICD-10-CM | POA: Diagnosis not present

## 2023-12-24 LAB — BASIC METABOLIC PANEL WITH GFR
Anion gap: 9 (ref 5–15)
BUN: 20 mg/dL (ref 8–23)
CO2: 31 mmol/L (ref 22–32)
Calcium: 8.8 mg/dL — ABNORMAL LOW (ref 8.9–10.3)
Chloride: 93 mmol/L — ABNORMAL LOW (ref 98–111)
Creatinine, Ser: 1.14 mg/dL (ref 0.61–1.24)
GFR, Estimated: >60 mL/min (ref >60)
Glucose, Bld: 95 mg/dL (ref 70–99)
Potassium: 4.2 mmol/L (ref 3.5–5.1)
Sodium: 133 mmol/L — ABNORMAL LOW (ref 135–145)

## 2023-12-24 MED ORDER — SPIRONOLACTONE 25 MG PO TABS
25.0000 mg | ORAL_TABLET | Freq: Every day | ORAL | 0 refills | Status: AC
  Filled 2023-12-24: qty 30, 30d supply, fill #0

## 2023-12-24 MED ORDER — ACETAMINOPHEN 325 MG PO TABS: 650.0000 mg | ORAL_TABLET | Freq: Four times a day (QID) | ORAL | Status: AC | PRN

## 2023-12-24 MED ORDER — FUROSEMIDE 40 MG PO TABS
40.0000 mg | ORAL_TABLET | Freq: Every day | ORAL | Status: DC
  Administered 2023-12-24: 40 mg via ORAL
  Filled 2023-12-24: qty 1

## 2023-12-24 MED ORDER — FUROSEMIDE 40 MG PO TABS
40.0000 mg | ORAL_TABLET | Freq: Every day | ORAL | 0 refills | Status: DC
  Filled 2023-12-24: qty 30, 30d supply, fill #0

## 2023-12-24 MED ORDER — EMPAGLIFLOZIN 10 MG PO TABS
10.0000 mg | ORAL_TABLET | Freq: Every day | ORAL | 0 refills | Status: DC
  Filled 2023-12-24: qty 30, 30d supply, fill #0

## 2023-12-24 NOTE — Progress Notes (Signed)
   Heart Failure Stewardship Pharmacist Progress Note  PCP: Katrinka Garnette KIDD, MD PCP-Cardiologist: Candyce Reek, MD   HPI:  77 YOF with PMH chronic diastolic HF, chronic hypoxic respiratory failure, COVID associated pulmonary fibrosis, paroxysmal Afib, COPD, obesity, OSA.  Presented to ED with SOB x 2 days.  ECHO showed LVEF 55-60%, grade I diastolic dysfunction.  Previous ECHO 08/2023 showed LVEF 65-70%.  Reports his wife handles most of his medications.  Let patient and wife know about [employer] grant for medication coverage and they verbalized understanding.   On exam, patient states he is doing better. Some SOB and chest discomfort but states this is his baseline. No dizziness / light headedness. No lower extremity swelling present.   Current HF Medications: MRA: spironolactone  25 mg daily SGLT2i: Jardiance  10 mg daily  Prior to admission HF Medications: Diuretic: furosemide  40 mg PO BID  Pertinent Lab Values: Serum creatinine 1.13>1.43>1.14, BUN 20, Potassium 4.2, Sodium 133, BNP 92.4, Magnesium  2.1 (6/26)  Vital Signs: Weight: 235 lbs today; (admission weight: 231 lbs - incomplete) Blood pressure: 90-120/60's Heart rate: 72 (72-91) I/O: net +0.9 yesterday; net -0.5L since admission - incomplete  Medication Assistance / Insurance Benefits Check: Does the patient have prescription insurance?  Yes Type of insurance plan: [employer]  Does the patient qualify for medication assistance through manufacturers or grants?   Yes Eligible grants and/or patient assistance programs: Orthoptist Medication assistance applications in progress: None  Medication assistance applications approved: Orthoptist  Approved medication assistance renewals will be completed by: [facility]  Outpatient Pharmacy:  Prior to admission outpatient pharmacy: [facility]  Is the patient willing to use [facility] TOC pharmacy at discharge? Yes Is the patient willing  to transition their outpatient pharmacy to utilize a [facility] outpatient pharmacy?   No  Assessment: 1. 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 NYHA class II/III symptoms. - Continue spironolactone  25 mg daily - Continue Jardiance  10 mg daily - Daily weight. Strict I/O's.  - Keep K > 4, Mg > 2   Plan: 1) Medication changes recommended at this time: - No changes at this time.   2) Patient assistance: - $255 deductible for Jardiance  -- $47 co-pay afterwards.  - Patient states this would not be affordable and would like patient assistance.  - [employer] Grant for Jardiance  / [place] - $4,500 - Educated patient on grant and he expressed understanding. Would like meds to be filled at normal [employer].  3)  Education  - Patient has been educated on current HF medications and potential additions to HF medication regimen - Patient verbalizes understanding that over the next few months, these medication doses may change and more medications may be added to optimize HF regimen - Patient has been educated on basic disease state pathophysiology and goals of therapy  Bernardino George, PharmD Candidate 2026 [employer] 12/24/2023 10:45 AM

## 2023-12-24 NOTE — TOC Transition Note (Signed)
 Transition of Care [facility]) - Discharge Note   Patient Details  Name: Wayne Booth MRN: [id number] Date of Birth: Nov 19, 1940  Transition of Care [facility]) CM/SW Contact:  Waddell Barnie Rama, RN Phone Number: 12/24/2023, 10:09 AM   Clinical Narrative:    For dc today, his wife will transport him home at dc per patient.  He is set up with [facility] for [facility] and [facility] for outpatient palliative services.         Patient Goals and CMS Choice            Discharge Placement                       Discharge Plan and Services Additional resources added to the After Visit Summary for                                       Social Drivers of Health (SDOH) Interventions SDOH Screenings   Food Insecurity: No Food Insecurity (12/21/2023)  Housing: Unknown (12/22/2023)  Transportation Needs: No Transportation Needs (12/22/2023)  Utilities: Not At Risk (12/21/2023)  Alcohol  Screen: Low Risk  (12/22/2023)  Depression (PHQ2-9): High Risk (02/11/2023)  Financial Resource Strain: Low Risk  (12/22/2023)  Physical Activity: Inactive (01/04/2023)  Social Connections: Moderately Integrated (12/21/2023)  Stress: No Stress Concern Present (01/04/2023)  Tobacco Use: Low Risk  (12/20/2023)     Readmission Risk Interventions    12/22/2023    5:01 PM 10/20/2023   11:54 AM  Readmission Risk Prevention Plan  Transportation Screening Complete Complete  PCP or Specialist Appt within 5-7 Days  Complete  PCP or Specialist Appt within 3-5 Days Complete   Home Care Screening  Complete  Medication Review (RN CM)  Complete  HRI or Home Care Consult Complete   Palliative Care Screening Not Applicable   Medication Review (RN Care Manager) Complete

## 2023-12-24 NOTE — Discharge Summary (Signed)
 Physician Discharge Summary   Patient: Wayne Booth MRN: [id number] DOB: [birth year]  Admit date:     12/20/2023  Discharge date: 12/24/23  Discharge Physician: Elidia Sieving Ayanni Tun   PCP: Katrinka Garnette KIDD, MD   Recommendations at discharge:    Patient has been placed on spironolactone  and SGLT 2 inh for diastolic heart failure, possible addition of ace ing or ARB as outpatient.  Instructed to take furosemide  daily 40 mg  Follow up renal function and electrolytes in 7 days as outpatient Follow up with Dr Katrinka in 7 to 10 days Follow up with Cardiology as scheduled.   Discharge Diagnoses: Principal Problem:   Acute on chronic diastolic CHF (congestive heart failure) (HCC) Active Problems:   Essential hypertension   Paroxysmal atrial fibrillation (HCC)   CKD (chronic kidney disease) stage 2, GFR 60-89 ml/min   CAD (coronary artery disease)   History of COPD   GAD (generalized anxiety disorder)   Obstructive sleep apnea   Gout   Class 1 obesity  Resolved Problems:   * No resolved hospital problems. [facility] Course: Wayne Booth was admitted to the hospital with the working diagnosis of heart failure exacerbation.   Wayne Booth with chronic diastolic CHF, chronic hypoxic respiratory failure on 5 L home O2, COPD, COVID associated pulmonary fibrosis, paroxysmal A-fib, obesity, and OSA who presented with worsening dyspnea for 2 to 3 days, patient reported poor compliance with diuretic therapy at home.  Recent hospitalization 06/01 to 06/07 due to heart failure exacerbation. He declined SNF and was discharge with instruction to take 40 mg furosemide  daily, but he was taking only as needed.  On arrival to the ED he was in respiratory distress and was placed on Cpap.  On hi initial physical examination his blood pressure was 140/82, HR 72, RR 31 and 02 saturation 100% on supplemental 02 per Cpap.  Lungs with coarse breath sounds with bilateral rales with no wheezing, heart with S1 and  S2 present with no gallops or rubs, abdomen with no distention and positive lower extremity edema.   VBG 7,41/ 48/ 81/ 31/ 96%  Na 137, K 4,5 Cl 104 bicarbonate 27 glucose 84 bun 10 cr 0,85  AST 29 ALTT 22  High sensitive troponin 5 and 6  Wbc 6.6 hgb 13.8 plt 352   SARS covid 19 negative RSV negative  Influenza negative   Chest radiograph with hypoinflation, positive cardiomegaly, bilateral hilar vascular congestion, bilateral small pleural effusions.   EKG 77 bpm, left axis deviation, normal intervals, qtc 432 sinus rhythm with q wave V1 and V2, with no significant ST segment or T wave changes.   Patient was placed on IV furosemide  with improvement in his symptoms.  Transitory increased in serum cr.  Placed on SGLT 2 inh His renal function has improved along with his volume status.  Plan to continue medical therapy at home with close follow up as outpatient.    Assessment and Plan: * Acute on chronic diastolic CHF (congestive heart failure) (HCC) Echocardiogram with preserved LV systolic function with EF 55 to 60%, RV systolic function preserved, no significant valvular disease.   Patient was placed on IV furosemide  for diuresis, negative fluid balance was achieved, with significant improvement in is symptoms.   Continue with Spironolactone  and SGLT 2 inh.  Diuresis with furosemide  40 mg po daily.  Possible addition or ace inh or ARB as outpatient.   Acute cardiogenic pulmonary edema with acute on chronic hypoxemic respiratory failure.  Patient was  weaned off Cpap with good toleration. Follow up chest radiograph with hypoinflation, cardiomegaly, and persistent bilateral lower lobe interstitial infiltrates.  At the time of his discharge his 02 saturation is 99% on 4 L/min per [place] with no signs of respiratory distress,   Essential hypertension Continue blood pressure control with spironolactone . Possible addition of ace inh or arb as outpatient.   Paroxysmal atrial  fibrillation (HCC) Patient remained sinus rhythm on telemetry monitoring.  He is not on AV blocker or anticoagulation. (He declined anticoagulation)   CKD (chronic kidney disease) stage 2, GFR 60-89 ml/min Hyponatremia, AKI   Volume has improved, at the time of his discharge his serum cr is 1,14 from 1,43, with K at 4,2 and serum bicarbonate at 31  Na 133 Mg 2.1   Continue furosemide  and spironolactone , follow up renal function and electrolytes as outpatient.   CAD (coronary artery disease) No chest pain, no acute coronary syndrome   History of COPD No signs of acute exacerbation Chronic hypoxemia, on supplemental 02 at home per [place] 5 L/min   COVID associated pulmonary fibrosis.   GAD (generalized anxiety disorder) Continue with escitalopram .   Obstructive sleep apnea Cpap   Gout No acute flare   Class 1 obesity Calculated BMI 32.2   Chronic pain syndrome, continue oral analgesics as home and follow up as outpatient.  Obesity hypoventilation syndrome.        Consultants: none  Procedures performed: none   Disposition: Home Diet recommendation:  Cardiac diet DISCHARGE MEDICATION: Allergies as of 12/24/2023       Reactions   Ambien  [zolpidem ] Anxiety, Other (See Comments)   Anxiety and agitation when tried as sleeper in hospital aug 2019        Medication List     STOP taking these medications    traMADol  50 MG tablet Commonly known as: ULTRAM        TAKE these medications    acetaminophen  325 MG tablet Commonly known as: TYLENOL  Take 2 tablets (650 mg total) by mouth every 6 (six) hours as needed for mild pain (pain score 1-3) or fever (or Fever >/= 101).   albuterol  (2.5 MG/3ML) 0.083% nebulizer solution Commonly known as: PROVENTIL  Take 3 mLs (2.5 mg total) by nebulization every 6 (six) hours as needed for wheezing or shortness of breath. What changed: Another medication with the same name was changed. Make sure you understand how and when to  take each.   albuterol  108 (90 Base) MCG/ACT inhaler Commonly known as: VENTOLIN  HFA INHALE 2 PUFFS BY MOUTH EVERY 6 HOURS AS NEEDED FOR WHEEZING OR SHORTNESS OF BREATH What changed: See the new instructions.   budesonide  0.25 MG/2ML nebulizer solution Commonly known as: PULMICORT  Take 2 mLs (0.25 mg total) by nebulization 2 (two) times daily.   busPIRone  5 MG tablet Commonly known as: BUSPAR  Take 5 mg by mouth at bedtime as needed (anxiety).   empagliflozin  10 MG Tabs tablet Commonly known as: JARDIANCE  Take 1 tablet (10 mg total) by mouth daily. Start taking on: December 25, 2023   escitalopram  20 MG tablet Commonly known as: LEXAPRO  TAKE 1 TABLET BY MOUTH DAILY   furosemide  40 MG tablet Commonly known as: LASIX  Take 1 tablet (40 mg total) by mouth daily. What changed:  medication strength when to take this   gabapentin  300 MG capsule Commonly known as: NEURONTIN  Take 1 capsule (300 mg total) by mouth 3 (three) times daily.   ipratropium-albuterol  0.5-2.5 (3) MG/3ML Soln Commonly known as: DUONEB Take  3 mLs by nebulization every 6 (six) hours as needed. What changed: reasons to take this   LORazepam  0.5 MG tablet Commonly known as: ATIVAN  Take 1 tablet (0.5 mg total) by mouth daily as needed for anxiety (do not take within 8 hours of bed. do not drive for 8 hours after taking.). What changed: reasons to take this   ondansetron  4 MG tablet Commonly known as: ZOFRAN  Take 1 tablet (4 mg total) by mouth every 6 (six) hours as needed for nausea.   OXYGEN  Inhale 5 L into the lungs continuous.   spironolactone  25 MG tablet Commonly known as: ALDACTONE  Take 1 tablet (25 mg total) by mouth daily. Start taking on: December 25, 2023   traZODone  50 MG tablet Commonly known as: DESYREL  Take 1 tablet (50 mg total) by mouth at bedtime.        Follow-up Information     [facility]. Go in 6 day(s).   Specialty: Cardiology Why:  Hospital follow upo 12/29/2023 @ 2 pm PLEASE bring a medication list to appointment FREE valet parking, Entrance C, off [employer] Look for women and Sport and exercise psychologist information: [address] [place]   [phone]        [facility] Follow up.   Specialty: Home Health Services Why: [facility]- Agency will call you to set up apt times Contact information: [address] [place] [address] [phone]                Discharge Exam: Filed Weights   12/22/23 0450 12/23/23 0639 12/24/23 0508  Weight: 105 kg 104 kg 107 kg   BP 107/75 (BP Location: Left Arm)   Pulse 87   Temp (!) 97.4 F (36.3 C) (Oral)   Resp 18   Ht 5' 11 (1.803 m)   Wt 107 kg   SpO2 99%   BMI 32.90 kg/m   Patient is feeling better, no chest pain or edema, no PND or orthopnea. Dyspnea is back to baseline.   Neurology awake and alert ENT with mild pallor Cardiovascular with S1 and S2 present and regular with no gallops, rubs or murmurs No JVD Respiratory with no wheezing or rhonchi. Mild prolonged expiratory phase and scattered rales  Abdomen with no distention  No lower extremity edema   Condition at discharge: stable  The results of significant diagnostics from this hospitalization (including imaging, microbiology, ancillary and laboratory) are listed below for reference.   Imaging Studies: DG Chest 1 View Result Date: 12/22/2023 CLINICAL DATA:  Follow-up, shortness of breath. EXAM: CHEST  1 VIEW COMPARISON:  Chest x-ray 12/20/2023 FINDINGS: The heart is enlarged. Lung volumes are low. In right mid and lower lung and left lower lung infiltrates are present. There is a small left pleural effusion. There is no pneumothorax or acute fracture. IMPRESSION: 1. Bilateral infiltrates, right greater than left. 2. Small left pleural effusion. 3. Cardiomegaly. Electronically Signed   By: Greig Pique M.D.   On: 12/22/2023  15:58   ECHOCARDIOGRAM COMPLETE Result Date: 12/21/2023    ECHOCARDIOGRAM REPORT   Patient Name:   PLEAS CARNEAL Alessio Date of Exam: 12/21/2023 Medical Rec #:  [id number]       Height:       71.0 in Accession #:    [id number]      Weight:       231.5 lb Date of Birth:  12/23/1940      BSA:  2.244 m Patient Age:    82 years        BP:           112/74 mmHg Patient Gender: M               HR:           80 bpm. Exam Location:  Inpatient Procedure: 2D Echo, Cardiac Doppler, Color Doppler and Intracardiac            Opacification Agent (Both Spectral and Color Flow Doppler were            utilized during procedure). Indications:    CHF  History:        Patient has prior history of Echocardiogram examinations, most                 recent 08/31/2023.  Sonographer:    AB Referring Phys: [id number] SUBRINA SUNDIL IMPRESSIONS  1. Left ventricular ejection fraction, by estimation, is 55 to 60%. Left ventricular ejection fraction by 2D MOD biplane is 55.4 %. The left ventricle has normal function. The left ventricle has no regional wall motion abnormalities. Left ventricular diastolic parameters are consistent with Grade I diastolic dysfunction (impaired relaxation).  2. Right ventricular systolic function is normal. The right ventricular size is not well visualized.  3. The mitral valve is degenerative. No evidence of mitral valve regurgitation.  4. The aortic valve is tricuspid. There is mild calcification of the aortic valve. Aortic valve regurgitation is not visualized. Aortic valve sclerosis is present, with no evidence of aortic valve stenosis. FINDINGS  Left Ventricle: Left ventricular ejection fraction, by estimation, is 55 to 60%. Left ventricular ejection fraction by 2D MOD biplane is 55.4 %. The left ventricle has normal function. The left ventricle has no regional wall motion abnormalities. The left ventricular internal cavity size was normal in size. There is no left ventricular hypertrophy. Left ventricular  diastolic parameters are consistent with Grade I diastolic dysfunction (impaired relaxation). Right Ventricle: The right ventricular size is not well visualized. No increase in right ventricular wall thickness. Right ventricular systolic function is normal. Left Atrium: Left atrial size was not well visualized. Right Atrium: Right atrial size was not well visualized. Pericardium: There is no evidence of pericardial effusion. Mitral Valve: The mitral valve is degenerative in appearance. No evidence of mitral valve regurgitation. Tricuspid Valve: The tricuspid valve is normal in structure. Tricuspid valve regurgitation is not demonstrated. No evidence of tricuspid stenosis. Aortic Valve: The aortic valve is tricuspid. There is mild calcification of the aortic valve. There is mild aortic valve annular calcification. Aortic valve regurgitation is not visualized. Aortic valve sclerosis is present, with no evidence of aortic valve stenosis. Pulmonic Valve: The pulmonic valve was normal in structure. Pulmonic valve regurgitation is not visualized. No evidence of pulmonic stenosis. Aorta: The aortic root and ascending aorta are structurally normal, with no evidence of dilitation. IAS/Shunts: The interatrial septum was not well visualized.  LEFT VENTRICLE PLAX 2D                        Biplane EF (MOD) LVIDd:         5.60 cm         LV Biplane EF:   Left LVIDs:         4.40 cm  ventricular LV PW:         0.90 cm                          ejection LV IVS:        0.90 cm                          fraction by LVOT diam:     2.00 cm                          2D MOD LVOT Area:     3.14 cm                         biplane is                                                 55.4 %.  LV Volumes (MOD)               Diastology LV vol d, MOD    69.3 ml       LV e' medial:    5.98 cm/s A2C:                           LV E/e' medial:  9.8 LV vol d, MOD    81.6 ml       LV e' lateral:   6.85 cm/s A4C:                            LV E/e' lateral: 8.5 LV vol s, MOD    31.1 ml A2C: LV vol s, MOD    36.2 ml A4C: LV SV MOD A2C:   38.2 ml LV SV MOD A4C:   81.6 ml LV SV MOD BP:    42.6 ml LEFT ATRIUM         Index LA diam:    3.50 cm 1.56 cm/m   AORTA Ao Root diam: 2.50 cm Ao Asc diam:  3.70 cm MITRAL VALVE               TRICUSPID VALVE MV Area (PHT): 3.21 cm    TR Peak grad:   24.6 mmHg MV Decel Time: 236 msec    TR Vmax:        248.00 cm/s MV E velocity: 58.50 cm/s MV A velocity: 54.70 cm/s  SHUNTS MV E/A ratio:  1.07        Systemic Diam: 2.00 cm Stanly Leavens MD Electronically signed by Stanly Leavens MD Signature Date/Time: 12/21/2023/11:04:01 AM    Final    DG Chest Port 1 View Result Date: 12/20/2023 CLINICAL DATA:  Shortness of breath EXAM: PORTABLE CHEST 1 VIEW COMPARISON:  11/28/2023 FINDINGS: Cardiomegaly, vascular congestion. Low lung volumes. Small bilateral pleural effusions again noted, unchanged. Patchy bilateral lower lobe airspace opacities. No acute bony abnormality. IMPRESSION: Cardiomegaly, vascular congestion. Low lung volumes with bilateral lower lobe airspace opacities which could reflect edema or infection. Small bilateral effusions. Electronically Signed   By: Franky Crease M.D.   On: 12/20/2023 21:15   CT Angio Chest Pulmonary Embolism (PE) W or WO Contrast Result Date: 11/28/2023  CLINICAL DATA:  Pulmonary embolus suspected with high probability. Respiratory distress. Sudden onset shortness of breath. EXAM: CT ANGIOGRAPHY CHEST WITH CONTRAST TECHNIQUE: Multidetector CT imaging of the chest was performed using the standard protocol during bolus administration of intravenous contrast. Multiplanar CT image reconstructions and MIPs were obtained to evaluate the vascular anatomy. RADIATION DOSE REDUCTION: This exam was performed according to the departmental dose-optimization program which includes automated exposure control, adjustment of the mA and/or kV according to patient size and/or use of  iterative reconstruction technique. CONTRAST:  65mL OMNIPAQUE  IOHEXOL  350 MG/ML SOLN COMPARISON:  Chest radiograph 11/28/2023.  CT chest 08/29/2023 FINDINGS: Cardiovascular: Technically adequate study with good opacification of the central and segmental pulmonary arteries. No focal filling defects. No evidence of significant pulmonary embolus. Cardiac enlargement. Calcification in the mitral valve annulus. No pericardial effusions. Normal caliber thoracic aorta. No dissection. Calcification in the aorta and coronary arteries. Mediastinum/Nodes: Thyroid  gland is unremarkable. Esophagus is decompressed. Mediastinal lymphadenopathy with largest lymph nodes in the subcarinal region measuring about 1.5 cm diameter. Etiology is nonspecific but likely reactive. Similar appearance to prior study. Lungs/Pleura: Consolidation or atelectasis in both lung bases with patchy airspace disease throughout the remainder of both lungs. Changes could represent multifocal pneumonia or possibly aspiration. Air bronchograms are present. Small bilateral pleural effusions, slightly greater on the left. No pneumothorax. Upper Abdomen: Surgical absence of the left kidney. No acute abnormality. Musculoskeletal: Degenerative changes in the spine. Postoperative change in the cervical spine. No acute bony abnormalities. Review of the MIP images confirms the above findings. IMPRESSION: 1. No evidence of significant pulmonary embolus. 2. Patchy infiltrates throughout both lungs with consolidation or atelectasis in both lung bases. This may represent multifocal pneumonia, edema, or aspiration. 3. Small bilateral pleural effusions. 4. Aortic atherosclerosis. 5. Enlarged mediastinal lymph nodes, similar to prior study, most likely to be reactive. Electronically Signed   By: Elsie Gravely M.D.   On: 11/28/2023 20:41   DG Chest Port 1 View Result Date: 11/28/2023 CLINICAL DATA:  SOB EXAM: PORTABLE CHEST - 1 VIEW COMPARISON:  None available.  FINDINGS: Low lung volumes. Unchanged small bilateral pleural effusions, slightly larger on the left than the right. Patchy airspace opacities in the right mid and lower lung zones. Minimal retrocardiac airspace opacities. Unchanged cardiomegaly. Tortuous aorta with aortic atherosclerosis. No acute fracture or destructive lesion. Multilevel degenerative disc disease of the spine. Cholecystectomy clips. IMPRESSION: Small bilateral pleural effusions, slightly larger on the left than the right. Patchy airspace opacities in the right mid lung and both lower lung zones, possibly atelectasis or bronchopneumonia in the correct clinical context. Electronically Signed   By: Rogelia Myers M.D.   On: 11/28/2023 16:55    Microbiology: Results for orders placed or performed during the hospital encounter of 12/20/23  Resp panel by RT-PCR (RSV, Flu A&B, Covid) Anterior Nasal Swab     Status: None   Collection Time: 12/20/23  8:47 PM   Specimen: Anterior Nasal Swab  Result Value Ref Range Status   SARS Coronavirus 2 by RT PCR NEGATIVE NEGATIVE Final   Influenza A by PCR NEGATIVE NEGATIVE Final   Influenza B by PCR NEGATIVE NEGATIVE Final    Comment: (NOTE) The Xpert Xpress SARS-CoV-2/FLU/RSV plus assay is intended as an aid in the diagnosis of influenza from Nasopharyngeal swab specimens and should not be used as a sole basis for treatment. Nasal washings and aspirates are unacceptable for Xpert Xpress SARS-CoV-2/FLU/RSV testing.  Fact Sheet for Patients: BloggerCourse.com  Fact Sheet for  Healthcare Providers: SeriousBroker.it  This test is not yet approved or cleared by the [place]  FDA and has been authorized for detection and/or diagnosis of SARS-CoV-2 by FDA under an Emergency Use Authorization (EUA). This EUA will remain in effect (meaning this test can be used) for the duration of the COVID-19 declaration under Section 564(b)(1) of the Act,  21 U.S.C. section 360bbb-3(b)(1), unless the authorization is terminated or revoked.     Resp Syncytial Virus by PCR NEGATIVE NEGATIVE Final    Comment: (NOTE) Fact Sheet for Patients: BloggerCourse.com  Fact Sheet for Healthcare Providers: SeriousBroker.it  This test is not yet approved or cleared by the [place]  FDA and has been authorized for detection and/or diagnosis of SARS-CoV-2 by FDA under an Emergency Use Authorization (EUA). This EUA will remain in effect (meaning this test can be used) for the duration of the COVID-19 declaration under Section 564(b)(1) of the Act, 21 U.S.C. section 360bbb-3(b)(1), unless the authorization is terminated or revoked.  Performed at [facility], [address], [place], [place] [address]    *Note: Due to a large number of results and/or encounters for the requested time period, some results have not been displayed. A complete set of results can be found in Results Review.    Labs: CBC: Recent Labs  Lab 12/20/23 2048 12/20/23 2103 12/20/23 2104 12/21/23 0223  WBC 6.6  --   --  7.1  NEUTROABS 4.9  --   --   --   HGB 13.8 13.9 14.6 14.0  HCT 42.4 41.0 43.0 43.7  MCV 92.0  --   --  92.6  PLT 352  --   --  369   Basic Metabolic Panel: Recent Labs  Lab 12/20/23 2048 12/20/23 2103 12/20/23 2104 12/21/23 0223 12/22/23 0243 12/23/23 0239 12/24/23 0305  NA 137   < > 141 140 135 133* 133*  K 4.5   < > 4.0 3.9 3.6 4.2 4.2  CL 104  --  101 99 94* 94* 93*  CO2 27  --   --  33* 31 35* 31  GLUCOSE 84  --  86 89 138* 95 95  BUN 10  --  10 8 14 20 20   CREATININE 0.85  --  0.90 0.88 1.13 1.43* 1.14  CALCIUM 8.0*  --   --  8.6* 8.5* 8.5* 8.8*  MG  --   --   --   --   --  2.1  --    < > = values in this interval not displayed.   Liver Function Tests: Recent Labs  Lab 12/20/23 2048  AST 29  ALT 22  ALKPHOS 54  BILITOT 1.0  PROT 7.1  ALBUMIN  2.6*   CBG: No  results for input(s): GLUCAP in the last 168 hours.  Discharge time spent: greater than 30 minutes.  Signed: Elidia Toribio Furnace, MD [facility] 12/24/2023

## 2023-12-24 NOTE — Progress Notes (Signed)
 Mobility Specialist Progress Note:   12/24/23 1049  Mobility  Activity Ambulated with assistance in hallway  Level of Assistance Contact guard assist, steadying assist  Assistive Device Front wheel walker  Distance Ambulated (ft) 100 ft  Activity Response Tolerated fair  Mobility Referral Yes  Mobility visit 1 Mobility  Mobility Specialist Start Time (ACUTE ONLY) 1049  Mobility Specialist Stop Time (ACUTE ONLY) 1100  Mobility Specialist Time Calculation (min) (ACUTE ONLY) 11 min    Pt agreeable to session. Excited and energetic about going home. No c/o symptoms during ambulation. Pt 6L SpO2 during ambulation. Left pt in recliner w/ all needs met.   Therisa Rana Mobility Specialist Please contact via SecureChat or  Rehab office at [phone]

## 2023-12-27 ENCOUNTER — Telehealth: Payer: Self-pay | Admitting: *Deleted

## 2023-12-27 NOTE — Transitions of Care (Post Inpatient/ED Visit) (Signed)
   12/27/2023  Name: Wayne Booth MRN: [id number] DOB: 09-24-1940  Today's TOC FU Call Status: Today's TOC FU Call Status:: Unsuccessful Call (1st Attempt) Unsuccessful Call (1st Attempt) Date: 12/27/23  Attempted to reach the patient regarding the most recent Inpatient/ED visit.  Follow Up Plan: Additional outreach attempts will be made to reach the patient to complete the Transitions of Care (Post Inpatient/ED visit) call.   Mliss Creed [facility], BSN RN Care Manager/ Transition of Care [place]/ [facility] [phone]

## 2023-12-28 ENCOUNTER — Telehealth: Payer: Self-pay | Admitting: *Deleted

## 2023-12-28 NOTE — Transitions of Care (Post Inpatient/ED Visit) (Signed)
   12/28/2023  Name: Wayne Booth MRN: [id number] DOB: [birth year]  Today's TOC FU Call Status: Today's TOC FU Call Status:: Unsuccessful Call (2nd Attempt) Unsuccessful Call (2nd Attempt) Date: 12/28/23  Attempted to reach the patient regarding the most recent Inpatient/ED visit.  Follow Up Plan: Additional outreach attempts will be made to reach the patient to complete the Transitions of Care (Post Inpatient/ED visit) call.   Mliss Creed [facility], BSN RN Care Manager/ Transition of Care [place]/ [facility] [phone]

## 2023-12-29 ENCOUNTER — Encounter (HOSPITAL_COMMUNITY)

## 2023-12-29 ENCOUNTER — Telehealth: Payer: Self-pay | Admitting: *Deleted

## 2023-12-29 DIAGNOSIS — N182 Chronic kidney disease, stage 2 (mild): Secondary | ICD-10-CM | POA: Diagnosis not present

## 2023-12-29 DIAGNOSIS — J449 Chronic obstructive pulmonary disease, unspecified: Secondary | ICD-10-CM | POA: Diagnosis not present

## 2023-12-29 DIAGNOSIS — J440 Chronic obstructive pulmonary disease with acute lower respiratory infection: Secondary | ICD-10-CM | POA: Diagnosis not present

## 2023-12-29 DIAGNOSIS — J849 Interstitial pulmonary disease, unspecified: Secondary | ICD-10-CM | POA: Diagnosis not present

## 2023-12-29 DIAGNOSIS — J9611 Chronic respiratory failure with hypoxia: Secondary | ICD-10-CM | POA: Diagnosis not present

## 2023-12-29 DIAGNOSIS — I130 Hypertensive heart and chronic kidney disease with heart failure and stage 1 through stage 4 chronic kidney disease, or unspecified chronic kidney disease: Secondary | ICD-10-CM | POA: Diagnosis not present

## 2023-12-29 DIAGNOSIS — I5043 Acute on chronic combined systolic (congestive) and diastolic (congestive) heart failure: Secondary | ICD-10-CM | POA: Diagnosis not present

## 2023-12-29 DIAGNOSIS — I4891 Unspecified atrial fibrillation: Secondary | ICD-10-CM | POA: Diagnosis not present

## 2023-12-29 NOTE — Transitions of Care (Post Inpatient/ED Visit) (Signed)
   12/29/2023  Name: Wayne Booth MRN: [id number] DOB: 1941/05/27  Today's TOC FU Call Status: Today's TOC FU Call Status:: Unsuccessful Call (3rd Attempt) Unsuccessful Call (3rd Attempt) Date: 12/29/23  Attempted to reach the patient regarding the most recent Inpatient/ED visit.  Follow Up Plan: No further outreach attempts will be made at this time. We have been unable to contact the patient.  Mliss Creed [facility], BSN RN Care Manager/ Transition of Care [place]/ [facility] [phone]

## 2023-12-29 NOTE — Progress Notes (Incomplete)
 HEART & VASCULAR TRANSITION OF CARE CONSULT NOTE     Referring Physician: Katrinka Garnette KIDD, MD   Chief Complaint:   HPI: Referred to clinic by *** for heart failure consultation.   [name] with chronic diastolic CHF, chronic hypoxic respiratory failure on 5 L home O2, COPD, COVID associated pulmonary fibrosis, paroxysmal A-fib, obesity, and OSA.  Recent hospitalization 06/01 to 06/07 due to heart failure exacerbation, in setting of poor med compliance. He was diuresed and discharged on PO Lasix . SNF placement was recommended by pt declined.   He ended up getting readmitted shortly after from 6/23-6/27 for recurrent a/c HF exacerbation, once again in the setting of poor med compliance. He was not taking lasix  daily as previously directed. ED he was in respiratory distress and was placed on Cpap, started on IV Lasix . After diurese, transitioned back to PO Lasix  and SGLT2i was added, Jardiance . Also discharged home on spironolactone .    Cardiac Testing   2D Echo 6/25   1. Left ventricular ejection fraction, by estimation, is 55 to 60%. Left  ventricular ejection fraction by 2D MOD biplane is 55.4 %. The left  ventricle has normal function. The left ventricle has no regional wall  motion abnormalities. Left ventricular  diastolic parameters are consistent with Grade I diastolic dysfunction  (impaired relaxation).   2. Right ventricular systolic function is normal. The right ventricular  size is not well visualized.   3. The mitral valve is degenerative. No evidence of mitral valve  regurgitation.   4. The aortic valve is tricuspid. There is mild calcification of the  aortic valve. Aortic valve regurgitation is not visualized. Aortic valve  sclerosis is present, with no evidence of aortic valve stenosis.     Past Medical History:  Diagnosis Date   Allergy    States his nose runs when he is around grease.    Anxiety    Arthritis    Atrial fibrillation (HCC)    Cancer  (HCC)    Coronary artery disease    History of total knee replacement    Hypertension    Hypothyroidism    Kidney cancer, primary, with metastasis from kidney to other site [facility])    Kidney stone    OSA (obstructive sleep apnea)    pt does not wear cpap at night   Pituitary cyst (HCC)    Pneumonia    hx of   Prostate cancer (HCC)     Current Outpatient Medications  Medication Sig Dispense Refill   acetaminophen  (TYLENOL ) 325 MG tablet Take 2 tablets (650 mg total) by mouth every 6 (six) hours as needed for mild pain (pain score 1-3) or fever (or Fever >/= 101).     albuterol  (PROVENTIL ) (2.5 MG/3ML) 0.083% nebulizer solution Take 3 mLs (2.5 mg total) by nebulization every 6 (six) hours as needed for wheezing or shortness of breath. 75 mL 3   albuterol  (VENTOLIN  HFA) 108 (90 Base) MCG/ACT inhaler INHALE 2 PUFFS BY MOUTH EVERY 6 HOURS AS NEEDED FOR WHEEZING OR SHORTNESS OF BREATH (Patient taking differently: Inhale 2 puffs into the lungs every 6 (six) hours as needed for wheezing or shortness of breath.) 8.5 g 2   budesonide  (PULMICORT ) 0.25 MG/2ML nebulizer solution Take 2 mLs (0.25 mg total) by nebulization 2 (two) times daily. (Patient not taking: Reported on 12/21/2023) 120 mL 1   busPIRone  (BUSPAR ) 5 MG tablet Take 5 mg by mouth at bedtime as needed (anxiety).     empagliflozin  (JARDIANCE )  10 MG TABS tablet Take 1 tablet (10 mg total) by mouth daily. 30 tablet 0   escitalopram  (LEXAPRO ) 20 MG tablet TAKE 1 TABLET BY MOUTH DAILY 90 tablet 3   furosemide  (LASIX ) 40 MG tablet Take 1 tablet (40 mg total) by mouth daily. 30 tablet 0   gabapentin  (NEURONTIN ) 300 MG capsule Take 1 capsule (300 mg total) by mouth 3 (three) times daily. 90 capsule 5   ipratropium-albuterol  (DUONEB) 0.5-2.5 (3) MG/3ML SOLN Take 3 mLs by nebulization every 6 (six) hours as needed. (Patient taking differently: Take 3 mLs by nebulization every 6 (six) hours as needed (wheezing or shortness of breath).) 360 mL 1    LORazepam  (ATIVAN ) 0.5 MG tablet Take 1 tablet (0.5 mg total) by mouth daily as needed for anxiety (do not take within 8 hours of bed. do not drive for 8 hours after taking.). (Patient taking differently: Take 0.5 mg by mouth daily as needed for anxiety.) 20 tablet 0   ondansetron  (ZOFRAN ) 4 MG tablet Take 1 tablet (4 mg total) by mouth every 6 (six) hours as needed for nausea. 20 tablet 0   OXYGEN  Inhale 5 L into the lungs continuous.     spironolactone  (ALDACTONE ) 25 MG tablet Take 1 tablet (25 mg total) by mouth daily. 30 tablet 0   traZODone  (DESYREL ) 50 MG tablet Take 1 tablet (50 mg total) by mouth at bedtime. 90 tablet 0   No current facility-administered medications for this visit.    Allergies  Allergen Reactions   Ambien  [Zolpidem ] Anxiety and Other (See Comments)    Anxiety and agitation when tried as sleeper in hospital aug 2019      Social History   Socioeconomic History   Marital status: Married    Spouse name: Elijah   Number of children: 2   Years of education: Not on file   Highest education level: High school graduate  Occupational History   Occupation: retired Engineer, site: RETIRED  Tobacco Use   Smoking status: Never   Smokeless tobacco: Never  Vaping Use   Vaping status: Never Used  Substance and Sexual Activity   Alcohol  use: Yes    Alcohol /week: 2.0 standard drinks of alcohol     Types: 2 Shots of liquor per week    Comment: socially   Drug use: No   Sexual activity: Yes  Other Topics Concern   Not on file  Social History Narrative   Married 1965 (wife patient of Dr. Elaine). 2 sons. 4 granddaughters.       Retired Investment banker, corporate.       Hobbies: golf, fishing, formerly hunting.    Social Drivers of Corporate investment banker Strain: Low Risk  (12/22/2023)   Overall Financial Resource Strain (CARDIA)    Difficulty of Paying Living Expenses: Not very hard  Food Insecurity: No Food Insecurity (12/21/2023)    Hunger Vital Sign    Worried About Running Out of Food in the Last Year: Never true    Ran Out of Food in the Last Year: Never true  Transportation Needs: No Transportation Needs (12/22/2023)   PRAPARE - Administrator, Civil Service (Medical): No    Lack of Transportation (Non-Medical): No  Physical Activity: Inactive (01/04/2023)   Exercise Vital Sign    Days of Exercise per Week: 0 days    Minutes of Exercise per Session: 0 min  Stress: No Stress Concern Present (01/04/2023)   [employer] of Occupational  Health - Occupational Stress Questionnaire    Feeling of Stress : Not at all  Social Connections: Moderately Integrated (12/21/2023)   Social Connection and Isolation Panel    Frequency of Communication with Friends and Family: More than three times a week    Frequency of Social Gatherings with Friends and Family: More than three times a week    Attends Religious Services: 1 to 4 times per year    Active Member of [employer] or Organizations: No    Attends Banker Meetings: Never    Marital Status: Married  Catering manager Violence: Not At Risk (12/21/2023)   Humiliation, Afraid, Rape, and Kick questionnaire    Fear of Current or Ex-Partner: No    Emotionally Abused: No    Physically Abused: No    Sexually Abused: No      Family History  Problem Relation Age of Onset   Cancer Mother        stomach, died when he was young   Cancer Father        ?mouth, died before patient born   Kidney cancer Sister    Colon cancer Brother 40   Gastric cancer Son 77       stomach cancer per pt   Esophageal cancer Neg Hx    Rectal cancer Neg Hx    Stomach cancer Neg Hx     There were no vitals filed for this visit.  PHYSICAL EXAM: General:  Well appearing. No respiratory difficulty HEENT: normal Neck: supple. no JVD. Carotids 2+ bilat; no bruits. No lymphadenopathy or thryomegaly appreciated. Cor: PMI nondisplaced. Regular rate & rhythm. No rubs, gallops or  murmurs. Lungs: clear Abdomen: soft, nontender, nondistended. No hepatosplenomegaly. No bruits or masses. Good bowel sounds. Extremities: no cyanosis, clubbing, rash, edema Neuro: alert & oriented x 3, cranial nerves grossly intact. moves all 4 extremities w/o difficulty. Affect pleasant.  ECG:   ASSESSMENT & PLAN:  NYHA *** GDMT  Diuretic- BB- Ace/ARB/ARNI MRA SGLT2i    Referred to HFSW (PCP, Medications, Transportation, ETOH Abuse, Drug Abuse, Insurance, Financial ): Yes or No Refer to Pharmacy: Yes or No Refer to Home Health: Yes on No Refer to Advanced Heart Failure Clinic: Yes or no  Refer to General Cardiology: Yes or No  Follow up

## 2023-12-30 DIAGNOSIS — R0689 Other abnormalities of breathing: Secondary | ICD-10-CM | POA: Diagnosis not present

## 2023-12-30 DIAGNOSIS — Z743 Need for continuous supervision: Secondary | ICD-10-CM | POA: Diagnosis not present

## 2023-12-31 ENCOUNTER — Other Ambulatory Visit: Payer: Self-pay

## 2023-12-31 ENCOUNTER — Inpatient Hospital Stay (HOSPITAL_COMMUNITY)
Admission: EM | Admit: 2023-12-31 | Discharge: 2024-01-02 | DRG: 291 | Disposition: A | Discharge status: Home Health | Attending: Family Medicine | Admitting: Family Medicine

## 2023-12-31 ENCOUNTER — Emergency Department (HOSPITAL_COMMUNITY)

## 2023-12-31 ENCOUNTER — Encounter (HOSPITAL_COMMUNITY): Payer: Self-pay | Admitting: Emergency Medicine

## 2023-12-31 DIAGNOSIS — Z96653 Presence of artificial knee joint, bilateral: Secondary | ICD-10-CM | POA: Diagnosis present

## 2023-12-31 DIAGNOSIS — J9611 Chronic respiratory failure with hypoxia: Secondary | ICD-10-CM | POA: Diagnosis present

## 2023-12-31 DIAGNOSIS — M109 Gout, unspecified: Secondary | ICD-10-CM | POA: Diagnosis present

## 2023-12-31 DIAGNOSIS — L89322 Pressure ulcer of left buttock, stage 2: Secondary | ICD-10-CM | POA: Diagnosis not present

## 2023-12-31 DIAGNOSIS — I700 Atherosclerosis of aorta: Secondary | ICD-10-CM | POA: Diagnosis present

## 2023-12-31 DIAGNOSIS — Z7901 Long term (current) use of anticoagulants: Secondary | ICD-10-CM | POA: Diagnosis not present

## 2023-12-31 DIAGNOSIS — Z7951 Long term (current) use of inhaled steroids: Secondary | ICD-10-CM

## 2023-12-31 DIAGNOSIS — Z905 Acquired absence of kidney: Secondary | ICD-10-CM | POA: Diagnosis not present

## 2023-12-31 DIAGNOSIS — Z8546 Personal history of malignant neoplasm of prostate: Secondary | ICD-10-CM

## 2023-12-31 DIAGNOSIS — N182 Chronic kidney disease, stage 2 (mild): Secondary | ICD-10-CM | POA: Diagnosis not present

## 2023-12-31 DIAGNOSIS — Z85528 Personal history of other malignant neoplasm of kidney: Secondary | ICD-10-CM

## 2023-12-31 DIAGNOSIS — F411 Generalized anxiety disorder: Secondary | ICD-10-CM | POA: Diagnosis present

## 2023-12-31 DIAGNOSIS — L89312 Pressure ulcer of right buttock, stage 2: Secondary | ICD-10-CM | POA: Diagnosis not present

## 2023-12-31 DIAGNOSIS — I5033 Acute on chronic diastolic (congestive) heart failure: Principal | ICD-10-CM | POA: Diagnosis present

## 2023-12-31 DIAGNOSIS — I5043 Acute on chronic combined systolic (congestive) and diastolic (congestive) heart failure: Secondary | ICD-10-CM | POA: Diagnosis present

## 2023-12-31 DIAGNOSIS — J440 Chronic obstructive pulmonary disease with acute lower respiratory infection: Secondary | ICD-10-CM | POA: Diagnosis present

## 2023-12-31 DIAGNOSIS — I509 Heart failure, unspecified: Secondary | ICD-10-CM | POA: Diagnosis not present

## 2023-12-31 DIAGNOSIS — Z6832 Body mass index (BMI) 32.0-32.9, adult: Secondary | ICD-10-CM | POA: Diagnosis not present

## 2023-12-31 DIAGNOSIS — R0602 Shortness of breath: Secondary | ICD-10-CM | POA: Diagnosis not present

## 2023-12-31 DIAGNOSIS — Z888 Allergy status to other drugs, medicaments and biological substances status: Secondary | ICD-10-CM

## 2023-12-31 DIAGNOSIS — Z8709 Personal history of other diseases of the respiratory system: Secondary | ICD-10-CM | POA: Diagnosis not present

## 2023-12-31 DIAGNOSIS — Z8679 Personal history of other diseases of the circulatory system: Secondary | ICD-10-CM | POA: Diagnosis not present

## 2023-12-31 DIAGNOSIS — R918 Other nonspecific abnormal finding of lung field: Secondary | ICD-10-CM | POA: Diagnosis not present

## 2023-12-31 DIAGNOSIS — I2510 Atherosclerotic heart disease of native coronary artery without angina pectoris: Secondary | ICD-10-CM | POA: Diagnosis present

## 2023-12-31 DIAGNOSIS — J410 Simple chronic bronchitis: Secondary | ICD-10-CM | POA: Diagnosis not present

## 2023-12-31 DIAGNOSIS — I480 Paroxysmal atrial fibrillation: Secondary | ICD-10-CM | POA: Diagnosis present

## 2023-12-31 DIAGNOSIS — E66811 Obesity, class 1: Secondary | ICD-10-CM | POA: Diagnosis present

## 2023-12-31 DIAGNOSIS — I130 Hypertensive heart and chronic kidney disease with heart failure and stage 1 through stage 4 chronic kidney disease, or unspecified chronic kidney disease: Secondary | ICD-10-CM | POA: Diagnosis not present

## 2023-12-31 DIAGNOSIS — E871 Hypo-osmolality and hyponatremia: Secondary | ICD-10-CM | POA: Diagnosis not present

## 2023-12-31 DIAGNOSIS — Z7984 Long term (current) use of oral hypoglycemic drugs: Secondary | ICD-10-CM | POA: Diagnosis not present

## 2023-12-31 DIAGNOSIS — J449 Chronic obstructive pulmonary disease, unspecified: Secondary | ICD-10-CM | POA: Diagnosis present

## 2023-12-31 DIAGNOSIS — Z79899 Other long term (current) drug therapy: Secondary | ICD-10-CM | POA: Diagnosis not present

## 2023-12-31 DIAGNOSIS — J90 Pleural effusion, not elsewhere classified: Secondary | ICD-10-CM | POA: Diagnosis not present

## 2023-12-31 DIAGNOSIS — M899 Disorder of bone, unspecified: Secondary | ICD-10-CM

## 2023-12-31 DIAGNOSIS — G4733 Obstructive sleep apnea (adult) (pediatric): Secondary | ICD-10-CM | POA: Diagnosis present

## 2023-12-31 DIAGNOSIS — Z981 Arthrodesis status: Secondary | ICD-10-CM

## 2023-12-31 DIAGNOSIS — J189 Pneumonia, unspecified organism: Secondary | ICD-10-CM | POA: Diagnosis present

## 2023-12-31 DIAGNOSIS — E039 Hypothyroidism, unspecified: Secondary | ICD-10-CM | POA: Diagnosis not present

## 2023-12-31 DIAGNOSIS — G629 Polyneuropathy, unspecified: Secondary | ICD-10-CM | POA: Diagnosis not present

## 2023-12-31 DIAGNOSIS — I110 Hypertensive heart disease with heart failure: Secondary | ICD-10-CM | POA: Diagnosis not present

## 2023-12-31 DIAGNOSIS — M79671 Pain in right foot: Secondary | ICD-10-CM

## 2023-12-31 DIAGNOSIS — Z800 Family history of malignant neoplasm of digestive organs: Secondary | ICD-10-CM

## 2023-12-31 DIAGNOSIS — Z8051 Family history of malignant neoplasm of kidney: Secondary | ICD-10-CM

## 2023-12-31 DIAGNOSIS — M25562 Pain in left knee: Secondary | ICD-10-CM

## 2023-12-31 LAB — COMPREHENSIVE METABOLIC PANEL WITH GFR
ALT: 22 U/L (ref 0–44)
AST: 24 U/L (ref 15–41)
Albumin: 3.4 g/dL — ABNORMAL LOW (ref 3.5–5.0)
Alkaline Phosphatase: 78 U/L (ref 38–126)
Anion gap: 13 (ref 5–15)
BUN: 21 mg/dL (ref 8–23)
CO2: 23 mmol/L (ref 22–32)
Calcium: 8.7 mg/dL — ABNORMAL LOW (ref 8.9–10.3)
Chloride: 97 mmol/L — ABNORMAL LOW (ref 98–111)
Creatinine, Ser: 1.17 mg/dL (ref 0.61–1.24)
GFR, Estimated: >60 mL/min (ref >60)
Glucose, Bld: 173 mg/dL — ABNORMAL HIGH (ref 70–99)
Potassium: 3.8 mmol/L (ref 3.5–5.1)
Sodium: 133 mmol/L — ABNORMAL LOW (ref 135–145)
Total Bilirubin: 0.6 mg/dL (ref 0.0–1.2)
Total Protein: 8.3 g/dL — ABNORMAL HIGH (ref 6.5–8.1)

## 2023-12-31 LAB — RESPIRATORY PANEL BY PCR

## 2023-12-31 LAB — CBC
HCT: 43.2 % (ref 39.0–52.0)
HCT: 45.1 % (ref 39.0–52.0)
Hemoglobin: 14 g/dL (ref 13.0–17.0)
Hemoglobin: 14.4 g/dL (ref 13.0–17.0)
MCH: 30.1 pg (ref 26.0–34.0)
MCH: 29.3 pg (ref 26.0–34.0)
MCHC: 32.4 g/dL (ref 30.0–36.0)
MCHC: 31.9 g/dL (ref 30.0–36.0)
MCV: 92.9 fL (ref 80.0–100.0)
MCV: 91.7 fL (ref 80.0–100.0)
Platelets: 349 K/uL (ref 150–400)
Platelets: 321 K/uL (ref 150–400)
RBC: 4.65 MIL/uL (ref 4.22–5.81)
RBC: 4.92 MIL/uL (ref 4.22–5.81)
RDW: 14.1 % (ref 11.5–15.5)
RDW: 13.9 % (ref 11.5–15.5)
WBC: 9.7 K/uL (ref 4.0–10.5)
WBC: 9.2 K/uL (ref 4.0–10.5)
nRBC: 0 % (ref 0.0–0.2)
nRBC: 0 % (ref 0.0–0.2)

## 2023-12-31 LAB — STREP PNEUMONIAE URINARY ANTIGEN: Strep Pneumo Urinary Antigen: NEGATIVE

## 2023-12-31 LAB — PROCALCITONIN: Procalcitonin: <0.1 ng/mL

## 2023-12-31 LAB — TROPONIN I (HIGH SENSITIVITY): Troponin I (High Sensitivity): 4 ng/L (ref <18)

## 2023-12-31 LAB — BRAIN NATRIURETIC PEPTIDE: B Natriuretic Peptide: 41.3 pg/mL (ref 0.0–100.0)

## 2023-12-31 MED ORDER — IPRATROPIUM-ALBUTEROL 0.5-2.5 (3) MG/3ML IN SOLN: 3.0000 mL | Freq: Four times a day (QID) | RESPIRATORY_TRACT | Status: DC | PRN

## 2023-12-31 MED ORDER — TRAZODONE HCL 50 MG PO TABS
50.0000 mg | ORAL_TABLET | Freq: Every day | ORAL | Status: DC
  Administered 2023-12-31 – 2024-01-01 (×2): 50 mg via ORAL
  Filled 2023-12-31 (×2): qty 1

## 2023-12-31 MED ORDER — ENOXAPARIN SODIUM 60 MG/0.6ML IJ SOSY
50.0000 mg | PREFILLED_SYRINGE | INTRAMUSCULAR | Status: DC
  Administered 2023-12-31 – 2024-01-02 (×3): 50 mg via SUBCUTANEOUS
  Filled 2023-12-31 (×4): qty 0.6

## 2023-12-31 MED ORDER — ACETAMINOPHEN 325 MG PO TABS
650.0000 mg | ORAL_TABLET | Freq: Four times a day (QID) | ORAL | Status: DC | PRN
  Administered 2024-01-01: 650 mg via ORAL
  Filled 2023-12-31: qty 2

## 2023-12-31 MED ORDER — FUROSEMIDE 10 MG/ML IJ SOLN
20.0000 mg | Freq: Two times a day (BID) | INTRAMUSCULAR | Status: DC
  Administered 2023-12-31 – 2024-01-02 (×4): 20 mg via INTRAVENOUS
  Filled 2023-12-31 (×4): qty 2

## 2023-12-31 MED ORDER — EMPAGLIFLOZIN 10 MG PO TABS
10.0000 mg | ORAL_TABLET | Freq: Every day | ORAL | Status: DC
  Administered 2023-12-31 – 2024-01-02 (×3): 10 mg via ORAL
  Filled 2023-12-31 (×3): qty 1

## 2023-12-31 MED ORDER — SODIUM CHLORIDE 0.9 % IV SOLN
100.0000 mg | Freq: Two times a day (BID) | INTRAVENOUS | Status: DC
  Administered 2023-12-31 – 2024-01-02 (×5): 100 mg via INTRAVENOUS
  Filled 2023-12-31 (×6): qty 100

## 2023-12-31 MED ORDER — GERHARDT'S BUTT CREAM
TOPICAL_CREAM | Freq: Two times a day (BID) | CUTANEOUS | Status: DC
  Filled 2023-12-31: qty 60

## 2023-12-31 MED ORDER — MEDIHONEY WOUND/BURN DRESSING EX PSTE
1.0000 | PASTE | Freq: Every day | CUTANEOUS | Status: DC
  Administered 2023-12-31 – 2024-01-02 (×3): 1 via TOPICAL
  Filled 2023-12-31: qty 44

## 2023-12-31 MED ORDER — BUDESONIDE 0.25 MG/2ML IN SUSP
0.2500 mg | Freq: Two times a day (BID) | RESPIRATORY_TRACT | Status: DC
  Administered 2023-12-31 – 2024-01-02 (×5): 0.25 mg via RESPIRATORY_TRACT
  Filled 2023-12-31 (×5): qty 2

## 2023-12-31 MED ORDER — ONDANSETRON HCL 4 MG/2ML IJ SOLN: 4.0000 mg | Freq: Four times a day (QID) | INTRAMUSCULAR | Status: DC | PRN

## 2023-12-31 MED ORDER — SODIUM CHLORIDE 0.9 % IV SOLN
2.0000 g | INTRAVENOUS | Status: DC
  Administered 2023-12-31: 2 g via INTRAVENOUS
  Filled 2023-12-31: qty 20

## 2023-12-31 MED ORDER — FUROSEMIDE 10 MG/ML IJ SOLN
40.0000 mg | Freq: Once | INTRAMUSCULAR | Status: AC
  Administered 2023-12-31: 40 mg via INTRAVENOUS
  Filled 2023-12-31: qty 4

## 2023-12-31 MED ORDER — ESCITALOPRAM OXALATE 20 MG PO TABS
20.0000 mg | ORAL_TABLET | Freq: Every day | ORAL | Status: DC
  Administered 2023-12-31 – 2024-01-02 (×3): 20 mg via ORAL
  Filled 2023-12-31 (×3): qty 1

## 2023-12-31 MED ORDER — ONDANSETRON HCL 4 MG PO TABS: 4.0000 mg | ORAL_TABLET | Freq: Four times a day (QID) | ORAL | Status: DC | PRN

## 2023-12-31 MED ORDER — GABAPENTIN 300 MG PO CAPS
300.0000 mg | ORAL_CAPSULE | Freq: Three times a day (TID) | ORAL | Status: DC
  Administered 2023-12-31 – 2024-01-02 (×7): 300 mg via ORAL
  Filled 2023-12-31 (×7): qty 1

## 2023-12-31 MED ORDER — BUSPIRONE HCL 5 MG PO TABS: 5.0000 mg | ORAL_TABLET | Freq: Every evening | ORAL | Status: DC | PRN

## 2023-12-31 MED ORDER — SODIUM CHLORIDE 0.9% FLUSH
3.0000 mL | Freq: Two times a day (BID) | INTRAVENOUS | Status: DC
  Administered 2023-12-31 – 2024-01-02 (×6): 3 mL via INTRAVENOUS

## 2023-12-31 MED ORDER — ACETAMINOPHEN 650 MG RE SUPP: 650.0000 mg | Freq: Four times a day (QID) | RECTAL | Status: DC | PRN

## 2023-12-31 MED ORDER — SODIUM CHLORIDE 0.9% FLUSH: 3.0000 mL | INTRAVENOUS | Status: DC | PRN

## 2023-12-31 MED ORDER — SODIUM CHLORIDE 0.9 % IV SOLN: 250.0000 mL | INTRAVENOUS | Status: AC | PRN

## 2023-12-31 MED ORDER — SPIRONOLACTONE 25 MG PO TABS
25.0000 mg | ORAL_TABLET | Freq: Every day | ORAL | Status: DC
  Administered 2023-12-31 – 2024-01-02 (×3): 25 mg via ORAL
  Filled 2023-12-31 (×3): qty 1

## 2023-12-31 NOTE — ED Notes (Signed)
Labs sent down at this time

## 2023-12-31 NOTE — ED Provider Notes (Signed)
 [place] EMERGENCY DEPARTMENT AT [facility] Provider Note   CSN: [id number] Arrival date & time: 12/31/23  0011     Patient presents with: Shortness of Breath   Wayne Booth is a 83 y.o. male.   The history is provided by the patient.  Shortness of Breath He has history of hypertension, atrial fibrillation anticoagulated on not on anticoagulation (patient declined), metastatic renal cancer, coronary artery disease, diastolic heart failure and comes in with shortness of breath which started this afternoon.  He is on oxygen  at his baseline but symptoms got suddenly worse today.  He denies chest pain, heaviness, tightness, pressure.  He denies fever, chills, sweats.  He denies any cough.  He came in by ambulance where he was given albuterol  and ipratropium via nebulizer and methylprednisolone .  He states he has been compliant with his home medications.   Prior to Admission medications   Medication Sig Start Date End Date Taking? Authorizing Provider  acetaminophen  (TYLENOL ) 325 MG tablet Take 2 tablets (650 mg total) by mouth every 6 (six) hours as needed for mild pain (pain score 1-3) or fever (or Fever >/= 101). 12/24/23   Arrien, Elidia Sieving, MD  albuterol  (PROVENTIL ) (2.5 MG/3ML) 0.083% nebulizer solution Take 3 mLs (2.5 mg total) by nebulization every 6 (six) hours as needed for wheezing or shortness of breath. 10/12/22   Parrett, Madelin RAMAN, NP  albuterol  (VENTOLIN  HFA) 108 (90 Base) MCG/ACT inhaler INHALE 2 PUFFS BY MOUTH EVERY 6 HOURS AS NEEDED FOR WHEEZING OR SHORTNESS OF BREATH Patient taking differently: Inhale 2 puffs into the lungs every 6 (six) hours as needed for wheezing or shortness of breath. 07/13/23   Parrett, Madelin RAMAN, NP  budesonide  (PULMICORT ) 0.25 MG/2ML nebulizer solution Take 2 mLs (0.25 mg total) by nebulization 2 (two) times daily. Patient not taking: Reported on 12/21/2023 09/07/23   Jillian Buttery, MD  busPIRone  (BUSPAR ) 5 MG tablet Take 5 mg by mouth  at bedtime as needed (anxiety). 11/03/23   [provider]  empagliflozin  (JARDIANCE ) 10 MG TABS tablet Take 1 tablet (10 mg total) by mouth daily. 12/25/23   Arrien, Mauricio Daniel, MD  escitalopram  (LEXAPRO ) 20 MG tablet TAKE 1 TABLET BY MOUTH DAILY 06/07/23   Katrinka Garnette KIDD, MD  furosemide  (LASIX ) 40 MG tablet Take 1 tablet (40 mg total) by mouth daily. 12/24/23   Arrien, Mauricio Daniel, MD  gabapentin  (NEURONTIN ) 300 MG capsule Take 1 capsule (300 mg total) by mouth 3 (three) times daily. 12/25/22   Katrinka Garnette KIDD, MD  ipratropium-albuterol  (DUONEB) 0.5-2.5 (3) MG/3ML SOLN Take 3 mLs by nebulization every 6 (six) hours as needed. Patient taking differently: Take 3 mLs by nebulization every 6 (six) hours as needed (wheezing or shortness of breath). 09/07/23   Jillian Buttery, MD  LORazepam  (ATIVAN ) 0.5 MG tablet Take 1 tablet (0.5 mg total) by mouth daily as needed for anxiety (do not take within 8 hours of bed. do not drive for 8 hours after taking.). Patient taking differently: Take 0.5 mg by mouth daily as needed for anxiety. 04/30/23   Katrinka Garnette KIDD, MD  ondansetron  (ZOFRAN ) 4 MG tablet Take 1 tablet (4 mg total) by mouth every 6 (six) hours as needed for nausea. 09/17/23   Will Almarie MATSU, MD  OXYGEN  Inhale 5 L into the lungs continuous.    [provider]  spironolactone  (ALDACTONE ) 25 MG tablet Take 1 tablet (25 mg total) by mouth daily. 12/25/23 01/24/24  Arrien, Elidia Sieving, MD  traZODone  (DESYREL ) 50 MG tablet Take 1 tablet (50 mg total) by mouth at bedtime. 12/03/23   Katrinka Garnette KIDD, MD    Allergies: Ambien  [zolpidem ]    Review of Systems  Respiratory:  Positive for shortness of breath.   All other systems reviewed and are negative.   Updated Vital Signs BP (!) 119/56   Pulse 91   Temp 98.3 F (36.8 C) (Oral)   Resp (!) 28   Wt 107 kg   SpO2 95%   BMI 32.90 kg/m   Physical Exam Vitals and nursing note reviewed.   83 year old male, resting  comfortably and in no acute distress. Vital signs are significant for elevated respiratory rate. Oxygen  saturation is 95%, which is normal. Head is normocephalic and atraumatic. PERRLA, EOMI. Oropharynx is clear. Neck is nontender and supple without adenopathy or JVD. Back is nontender and there is no CVA tenderness.  There is no presacral edema. Lungs have bibasilar rales which are greater on the right.  There is no wheezing. Chest is nontender. Heart has regular rate and rhythm without murmur. Abdomen is soft, flat, nontender. Extremities have no cyanosis or edema, full range of motion is present. Skin is warm and dry without rash. Neurologic: Awake and alert, moves all extremities equally.  (all labs ordered are listed, but only abnormal results are displayed) Labs Reviewed  CBC  BRAIN NATRIURETIC PEPTIDE  BASIC METABOLIC PANEL WITH GFR  TROPONIN I (HIGH SENSITIVITY)    EKG: EKG Interpretation Date/Time:  Friday December 31 2023 00:46:52 EDT Ventricular Rate:  91 PR Interval:    QRS Duration:  101 QT Interval:  378 QTC Calculation: 466 R Axis:   -12  Text Interpretation: Sinus rhythm Low voltage, precordial leads Borderline T wave abnormalities When compared with ECG of 12/20/2023, No significant change was found Confirmed by Raford Lenis ([id number]) on 12/31/2023 12:55:40 AM  Radiology: [facility] Chest Portable 1 View Result Date: 12/31/2023 CLINICAL DATA:  Sob  Increasing shortness of breath, COPD, CHF EXAM: PORTABLE CHEST 1 VIEW COMPARISON:  Chest x-ray 12/22/2023 FINDINGS: The heart and mediastinal contours are unchanged. Atherosclerotic plaque. Interval increase in right lung patchy airspace opacities as well as left lower lobe and lingular patchy airspace opacities. No pulmonary edema. Bilateral trace to small volume pleural effusion. No pneumothorax. No acute osseous abnormality. IMPRESSION: 1. Interval increase in right lung patchy airspace opacities as well as left lower lobe and lingular  patchy airspace opacities. Findings suggestive of multifocal pneumonia. Followup PA and lateral chest X-ray is recommended in 3-4 weeks following therapy to ensure resolution. 2. Bilateral trace to small volume pleural effusion. 3.  Aortic Atherosclerosis (ICD10-I70.0). Electronically Signed   By: Morgane  Naveau M.D.   On: 12/31/2023 01:11     Procedures  Cardiac monitor shows normal sinus rhythm, per my interpretation. Medications Ordered in the ED - No data to display                                  Medical Decision Making Amount and/or Complexity of Data Reviewed Labs: ordered. Radiology: ordered.  Risk Prescription drug management.   Worsening shortness of breath.  This is a presentation which has a wide range of treatment options and carries with it a high risk of morbidity and complications.  Differential diagnosis includes, but is not limited to, COPD exacerbation, heart failure exacerbation, ACS, pneumonia, pulmonary embolism, pleural effusions.  I have reviewed his  electrocardiogram, my interpretation is sinus rhythm with borderline T wave changes unchanged from prior.  I have reviewed his past records, and note hospitalization 12/20/2023-12/24/2023 for exacerbation of chronic diastolic heart failure.  Echocardiogram on 12/21/2023 showed ejection fraction of 55-60% with grade 1 diastolic dysfunction.  Clinically, picture is more compatible with heart failure exacerbation than COPD exacerbation.  I have ordered a dose of furosemide .  I have reviewed his laboratory test, my interpretation is normal CBC, normal BNP, mild hyponatremia which is not felt to be clinically significant.  Chest x-ray reading per radiologist's interval increase in right lung patchy airspace opacities as well as left lower lobe and lingular patchy airspace opacities suggestive of multifocal pneumonia.  I have independently viewed the image, and I actually feel that these are secondary to heart failure and not  multifocal pneumonia.  His clinical picture is not that of pneumonia with no fever and no cough and no leukocytosis.  I have discussed case with Dr. Sundil of [facility], who agrees to admit the patient.     Final diagnoses:  Acute on chronic diastolic heart failure (HCC)  Hyponatremia    ED Discharge Orders     None          Raford Lenis, MD 12/31/23 0225

## 2023-12-31 NOTE — Consult Note (Signed)
 WOC Nurse Consult Note: Reason for Consult:scrotal and buttocks wounds  Wound type: 1. Full thickness posterior scrotum uncertain if pressure or related to moisture and friction appears red moist  2.  Stage 3 Pressure Injuries to buttock 50% pink 50% tan  Pressure Injury POA: Yes Measurement: see nursing flowsheet  Wound bed: as above  Drainage (amount, consistency, odor) see nursing flowsheet  Periwound: peeling skin  Dressing procedure/placement/frequency:  Cleanse scrotal wound with NS, apply Xeroform gauze Soila [phone]) to wound bed daily and hold in place with ABD pad.  Cleanse buttocks wounds with NS, Apply Medihoney to wound beds daily and cover with dry gauze and silicone foam or ABD whichever is preferred.  Will order Gerhardt's to intact skin of buttocks 2 times a day and prn soiling.   POC discussed with bedside nurse. WOC team will not follow. Re-consult if further needs arise.   Thank you,    Powell Bar MSN, RN-BC, [employer]

## 2023-12-31 NOTE — Progress Notes (Signed)
   12/31/23 2021  BiPAP/CPAP/SIPAP  BiPAP/CPAP/SIPAP Pt Type Adult  Reason BIPAP/CPAP not in use Non-compliant

## 2023-12-31 NOTE — Evaluation (Signed)
 Physical Therapy Evaluation Patient Details Name: Wayne Booth MRN: [id number] DOB: 09-Dec-1940 Today's Date: 12/31/2023  History of Present Illness  83 y.o. male with a history of COPD, heart failure with preserved EF, chronic respite failure, paroxysmal atrial fibrillation, CKD stage II, CAD, obstructive sleep apnea, obesity, interstitial lung disease, gout, general anxiety disorder, peripheral neuropathy.  Patient presented secondary to shortness of breath and was found to have evidence of likely acute on chronic heart failure in addition to CAP  Clinical Impression  Pt admitted with above diagnosis.  Pt ind at baseline, does not/will not use DME. States he uses 5L O2 prn. Pt able to amb ~ 50' with RW and CGA; O2 95% on RA at rest (prior to initiating activity), O2 decr to 80% on RA, replaced at 3L with recovery in 1.5 minutes to 94%, pt denies signficant fatigue or dyspnea.  Left pt on 3L EOS, RN updated. Will benefit from resuming HHPT at d/c   Pt currently with functional limitations due to the deficits listed below (see PT Problem List). Pt will benefit from acute skilled PT to increase their independence and safety with mobility to allow discharge.           If plan is discharge home, recommend the following: A little help with walking and/or transfers;A little help with bathing/dressing/bathroom;Assistance with cooking/housework;Assist for transportation;Help with stairs or ramp for entrance   Can travel by private vehicle   Yes    Equipment Recommendations Other (comment) (has DME, does not use)  Recommendations for Other Services       Functional Status Assessment Patient has had a recent decline in their functional status and demonstrates the ability to make significant improvements in function in a reasonable and predictable amount of time.     Precautions / Restrictions Precautions Precautions: Fall Precaution/Restrictions Comments: monitor sats      Mobility  Bed  Mobility               General bed mobility comments: not assessed, pt reports sleeping in recliner--pt in recliner and returned to same    Transfers Overall transfer level: Needs assistance Equipment used: Rolling walker (2 wheels) Transfers: Sit to/from Stand Sit to Stand: Contact guard assist           General transfer comment: for safety, no physical assist, wide BOS    Ambulation/Gait Ambulation/Gait assistance: Contact guard assist Gait Distance (Feet): 50 Feet Assistive device: Rolling walker (2 wheels) Gait Pattern/deviations: Decreased step length - left, Decreased stride length, Decreased step length - right, Trunk flexed       General Gait Details: cues to use RW and  correct posture as able, self monitor RPE. O2 decr to 80% on RA, replaced at 3L with recovery in 1.5 minutes to 94%, pt denies signficant fatigue or dyspnea.  Stairs            Wheelchair Mobility     Tilt Bed    Modified Rankin (Stroke Patients Only)       Balance   Sitting-balance support: Feet supported, No upper extremity supported Sitting balance-Leahy Scale: Good     Standing balance support: During functional activity, Reliant on assistive device for balance Standing balance-Leahy Scale: Fair Standing balance comment: incr reliance on RW with incr fatigue                             Pertinent Vitals/Pain Pain Assessment Pain Assessment: No/denies pain  Home Living Family/patient expects to be discharged to:: Private residence Living Arrangements: Spouse/significant other Available Help at Discharge: Family;Available 24 hours/day Type of Home: House Home Access: Level entry       Home Layout: Multi-level;Able to live on main level with bedroom/bathroom Home Equipment: Shower seat - built [employer] fundraiser (2 wheels);Cane - single point;Hand held shower head Additional Comments: 5L reports using it as needed    Prior Function Prior Level of  Function : Independent/Modified Independent;Driving             Mobility Comments: reports ind ADLs Comments: indep, uses shower seat     Extremity/Trunk Assessment   Upper Extremity Assessment Upper Extremity Assessment: Defer to OT evaluation    Lower Extremity Assessment Lower Extremity Assessment: Overall WFL for tasks assessed       Communication   Communication Communication: No apparent difficulties    Cognition Arousal: Alert Behavior During Therapy: WFL for tasks assessed/performed   PT - Cognitive impairments: No apparent impairments                       PT - Cognition Comments: inappropriate comments at times, corrects himself  and apologizes stating he is jsut talking junk Following commands: Intact       Cueing Cueing Techniques: Verbal cues, Gestural cues     General Comments      Exercises     Assessment/Plan    PT Assessment Patient needs continued PT services  PT Problem List Decreased strength;Cardiopulmonary status limiting activity;Decreased activity tolerance;Decreased balance;Decreased mobility;Decreased knowledge of use of DME       PT Treatment Interventions DME instruction;Balance training;Neuromuscular re-education;Gait training;Functional mobility training;Therapeutic activities;Therapeutic exercise;Patient/family education    PT Goals (Current goals can be found in the Care Plan section)  Acute Rehab PT Goals Patient Stated Goal: Return Home PT Goal Formulation: With patient Time For Goal Achievement: 01/19/24 Potential to Achieve Goals: Good    Frequency Min 2X/week     Co-evaluation               AM-PAC PT 6 Clicks Mobility  Outcome Measure Help needed turning from your back to your side while in a flat bed without using bedrails?: A Little Help needed moving from lying on your back to sitting on the side of a flat bed without using bedrails?: A Lot Help needed moving to and from a bed to a chair  (including a wheelchair)?: A Little Help needed standing up from a chair using your arms (e.g., wheelchair or bedside chair)?: A Little Help needed to walk in hospital room?: A Little Help needed climbing 3-5 steps with a railing? : A Little 6 Click Score: 17    End of Session Equipment Utilized During Treatment: Gait belt;Oxygen  Activity Tolerance: Patient tolerated treatment well;Patient limited by fatigue Patient left: in chair;with call bell/phone within reach;with chair alarm set   PT Visit Diagnosis: Unsteadiness on feet (R26.81);Muscle weakness (generalized) (M62.81)    Time: 8453-8392 PT Time Calculation (min) (ACUTE ONLY): 21 min   Charges:   PT Evaluation $PT Eval Low Complexity: 1 Low   PT General Charges $$ ACUTE PT VISIT: 1 Visit         Liz Pinho, PT  Acute Rehab Dept [facility]) [phone]  12/31/2023   [facility] 12/31/2023, 4:19 PM

## 2023-12-31 NOTE — ED Notes (Signed)
 Pt asked to see my name tag and proceed to touch my breast area '' and commented that we should keep it  a secret I left and notified pt

## 2023-12-31 NOTE — Progress Notes (Signed)
 Mobility Specialist - Progress Note   12/31/23 1312  Oxygen  Therapy  O2 Device Nasal Cannula  O2 Flow Rate (L/min) 6 L/min  Patient Activity (if Appropriate) Ambulating  Mobility  Activity Ambulated with assistance in hallway  Level of Assistance Standby assist, set-up cues, supervision of patient - no hands on  Assistive Device Front wheel walker  Distance Ambulated (ft) 80 ft  Activity Response Tolerated well  Mobility Referral Yes  Mobility visit 1 Mobility  Mobility Specialist Start Time (ACUTE ONLY) 1232  Mobility Specialist Stop Time (ACUTE ONLY) 1243  Mobility Specialist Time Calculation (min) (ACUTE ONLY) 11 min   Pt received in recliner and agreeable to mobility. SOB throughout session. No complaints during session. Unable to obtain SpO2 reading during session. Pt to recliner after session with all needs met.   [facility]

## 2023-12-31 NOTE — ED Triage Notes (Signed)
 Pt in from home via [facility] with c/o sob onset this afternoon. Pt with hx of COPD and new CHF, states he wears 5LNC at home. Pt reports compliance with Lasix  and took home Albuterol  neb with little relief. Given 1 duoneb and 125mg  Solumedrol by EMS. EMS VS: 128/68 92HR 92% on home [facility]

## 2023-12-31 NOTE — Progress Notes (Signed)
 PROGRESS NOTE    [name]  FMW:[id number] DOB: July 13, 1940 DOA: 12/31/2023 PCP: Katrinka Garnette KIDD, MD   Brief Narrative: Wayne Booth is a 83 y.o. male with a history of COPD, heart failure with preserved EF, chronic respite failure, paroxysmal atrial fibrillation, CKD stage II, CAD, obstructive sleep apnea, obesity, interstitial lung disease, gout, general anxiety disorder, peripheral neuropathy.  Patient presented secondary to shortness of breath and was found to have evidence of likely acute on chronic heart failure in addition to possible pneumonia.  Patient was on Lasix  IV for diuresis and started on antibiotics for treatment of presumed pneumonia.   Assessment and Plan:  Acute on chronic diastolic heart failure Recent echo from June 2025 significant for an LVEF of 55 to 60% with grade 1 diastolic dysfunction. BNP obtained and is low at 41.3. High sensitivity troponin of only 4.  Patient started on Lasix  IV for treatment.  Patient reports improved symptoms of dyspnea this morning. -PT/OT evaluation  Respiratory distress Secondary to acute..  Community acquired pneumonia Patient with symptoms of dyspnea, but no chest pain or productive cough. Patient with chest x-ray evidence of increased right lung and left lower lobe/lingular patchy airspace opacities suggesting multifocal pneumonia. No leukocytosis and procalcitonin is undetectable.  Patient started empirically on ceftriaxone  and doxycycline .  Respiratory virus panel was negative as well.  Difficult to parse out if this is true infection or not, however does not appear to be the case. - Discontinue ceftriaxone  will continue doxycycline  for atypical coverage  History of COPD History of ILD Patient follows with pulmonology as an outpatient but has multiple no-shows, per outpatient pulmonology office records. Last follow-up appears to be over one year prior. Patient is prescribed albuterol /DuoNeb and Pulmicort . -Continue  Pulmicort  -Continue DuoNeb as needed  Chronic respiratory failure Patient is on 5 L/min of supplemental oxygen  at baseline. Stable.  History of CAD Noted. Not currently on medication therapy and has not been following up with cardiology.  Paroxysmal atrial fibrillation Noted. Currently in sinus rhythm. Per chart review, patient has declined anticoagulation in the past and is not on rate or rhythm control medication therapy at this time.  Peripheral neuropathy -Continue gabapentin   Generalized anxiety disorder -Continue BuSpar   OSA -Continue CPAP at bedtime  Obesity, class I Estimated body mass index is 32.9 kg/m as calculated from the following:   Height as of this encounter: 5' 11 (1.803 m).   Weight as of this encounter: 107 kg.   DVT prophylaxis: Lovenox  Code Status:   Code Status: Full Code Family Communication: None at bedside Disposition Plan: Discharge home likely in 24 hours pending transition off of IV Lasix  and PT/OT recommendations.   Consultants:  None  Procedures:  None  Antimicrobials: Ceftriaxone  Doxycycline     Subjective: Patient reports feeling much better than yesterday.  No issues overnight.  Objective: BP 138/83 (BP Location: Left Arm)   Pulse 98   Temp 97.7 F (36.5 C) (Oral)   Resp 20   Ht 5' 11 (1.803 m)   Wt 107 kg   SpO2 97%   BMI 32.90 kg/m   Examination:  General exam: Appears calm and comfortable Respiratory system: Rales at right base. Respiratory effort normal. Cardiovascular system: S1 & S2 heard, RRR. No murmurs. Gastrointestinal system: Abdomen is distended, soft and nontender.  Normal bowel sounds heard. Central nervous system: Alert and oriented. No focal neurological deficits. Psychiatry: Judgement and insight appear normal. Mood & affect appropriate.    Data Reviewed: I  have personally reviewed following labs and imaging studies  CBC Lab Results  Component Value Date   WBC 9.2 12/31/2023   RBC 4.92  12/31/2023   HGB 14.4 12/31/2023   HCT 45.1 12/31/2023   MCV 91.7 12/31/2023   MCH 29.3 12/31/2023   PLT 321 12/31/2023   MCHC 31.9 12/31/2023   RDW 13.9 12/31/2023   LYMPHSABS 0.9 12/20/2023   MONOABS 0.6 12/20/2023   EOSABS 0.2 12/20/2023   BASOSABS 0.0 12/20/2023     Last metabolic panel Lab Results  Component Value Date   NA 133 (L) 12/31/2023   K 3.8 12/31/2023   CL 97 (L) 12/31/2023   CO2 23 12/31/2023   BUN 21 12/31/2023   CREATININE 1.17 12/31/2023   GLUCOSE 173 (H) 12/31/2023   GFRNONAA >60 12/31/2023   GFRAA 72 07/12/2019   CALCIUM 8.7 (L) 12/31/2023   PHOS 5.4 (H) 02/23/2018   PROT 8.3 (H) 12/31/2023   ALBUMIN  3.4 (L) 12/31/2023   BILITOT 0.6 12/31/2023   ALKPHOS 78 12/31/2023   AST 24 12/31/2023   ALT 22 12/31/2023   ANIONGAP 13 12/31/2023    GFR: Estimated Creatinine Clearance: 61.6 mL/min (by C-G formula based on SCr of 1.17 mg/dL).  No results found for this or any previous visit (from the past 240 hours).    Radiology Studies: DG Chest Portable 1 View Result Date: 12/31/2023 CLINICAL DATA:  Sob  Increasing shortness of breath, COPD, CHF EXAM: PORTABLE CHEST 1 VIEW COMPARISON:  Chest x-ray 12/22/2023 FINDINGS: The heart and mediastinal contours are unchanged. Atherosclerotic plaque. Interval increase in right lung patchy airspace opacities as well as left lower lobe and lingular patchy airspace opacities. No pulmonary edema. Bilateral trace to small volume pleural effusion. No pneumothorax. No acute osseous abnormality. IMPRESSION: 1. Interval increase in right lung patchy airspace opacities as well as left lower lobe and lingular patchy airspace opacities. Findings suggestive of multifocal pneumonia. Followup PA and lateral chest X-ray is recommended in 3-4 weeks following therapy to ensure resolution. 2. Bilateral trace to small volume pleural effusion. 3.  Aortic Atherosclerosis (ICD10-I70.0). Electronically Signed   By: Morgane  Naveau M.D.   On:  12/31/2023 01:11      LOS: 0 days    Elgin Lam, MD [facility] 12/31/2023, 5:26 AM   If 7PM-7AM, please contact night-coverage [email or web address]

## 2023-12-31 NOTE — Hospital Course (Signed)
 Wayne Booth is a 83 y.o. male with a history of COPD, heart failure with preserved EF, chronic respite failure, paroxysmal atrial fibrillation, CKD stage II, CAD, obstructive sleep apnea, obesity, interstitial lung disease, gout, general anxiety disorder, peripheral neuropathy.  Patient presented secondary to shortness of breath and was found to have evidence of likely acute on chronic heart failure in addition to possible pneumonia.  Patient was on Lasix  IV for diuresis and started on antibiotics for treatment of presumed pneumonia.

## 2023-12-31 NOTE — Plan of Care (Signed)
  Problem: Clinical Measurements: Goal: Ability to maintain a body temperature in the normal range will improve Outcome: Progressing   Problem: Activity: Goal: Risk for activity intolerance will decrease Outcome: Progressing   Problem: Coping: Goal: Level of anxiety will decrease Outcome: Progressing

## 2023-12-31 NOTE — Progress Notes (Signed)
   12/31/23 0403  BiPAP/CPAP/SIPAP  BiPAP/CPAP/SIPAP Pt Type Adult  Reason BIPAP/CPAP not in use Non-compliant

## 2023-12-31 NOTE — H&P (Signed)
 History and Physical    Wayne Booth FMW:[id number] DOB: 1941/02/06 DOA: 12/31/2023  PCP: Katrinka Garnette KIDD, MD   Patient coming from: Home   Chief Complaint:  Chief Complaint  Patient presents with   Shortness of Breath   ED TRIAGE note: Pt in from home via [facility] with c/o sob onset this afternoon. Pt with hx of COPD and new CHF, states he wears 5LNC at home. Pt reports compliance with Lasix  and took home Albuterol  neb with little relief. Given 1 duoneb and 125mg  Solumedrol by EMS. EMS VS: 128/68 92HR 92% on home 5LNC            HPI:  Wayne Booth is a 83 y.o. male with medical history significant of COPD, HFpEF 55 to 60%, chronic hypoxic respiratory failure 5 L oxygen  at baseline, paroxysmal atrial fibrillation (declined anticoagulation in the past), CKD stage II, history of CAD, obstructive sleep apnea, morbid obesity, gout, and generalized anxiety disorder presented emergency department complaining of shortness of breath that started in the afternoon.  Reported that he has been compliance with diuretics and used albuterol  nebulizer at home without much relief. And route to ED patient has been given Solu-Medrol  125 mg. Patient was admitted and discharged from the hospital 1 week ago.  He was managed for acute on chronic CHF exacerbation treated with IV Lasix  and eventually transition to oral Lasix  40 mg daily. Of note patient was offered anticoagulation during last hospitalization however patient declined anticoagulation and he was also offered SNF placement patient also declined placement as well.  Patient reported he has orthopnea, dyspnea on exertion and paroxysmal nocturnal dyspnea.  Denies any lower extremity swelling.  Patient denies any fever, chill and cough.  Patient reported that even though he has been taking Lasix  he has not really following any fluid restriction and salt counting to his diet. Patient denies any chest pain and palpitation.   ED Course:  At  presentation to ED patient is tachypneic respiratory 20-30, borderline hypotensive blood pressure 119/56 and O2 sat 95 to 97% on 5 L oxygen .  Afebrile and heart rate in between 91-93. EKG showed normal sinus rhythm heart rate 91.  Chest x-ray showed: IMPRESSION: 1. Interval increase in right lung patchy airspace opacities as well as left lower lobe and lingular patchy airspace opacities. Findings suggestive of multifocal pneumonia. Followup PA and lateral chest X-ray is recommended in 3-4 weeks following therapy to ensure resolution. 2. Bilateral trace to small volume pleural effusion. 3.  Aortic Atherosclerosis (ICD10-I70.0).  Lab work, troponin within normal range.  Normal BNP level.  CBC unremarkable.  Pending BNP.  In the ED patient has been given IV Lasix  40 mg.  Dr. Raford stated that patient does not have any evidence of wheezing.  Also patient is afebrile, CBC no evidence of leukocytosis and patient does not have a cough.  At this time there is no concern for pneumonia or COPD conservation more so concern for CHF exacerbation.  Hospitalist has been consulted for further evaluation management of acute on chronic CHF exacerbation.   Significant labs in the ED: Lab Orders         Culture, blood (routine x 2) Call MD if unable to obtain prior to antibiotics being given         Expectorated Sputum Assessment w Gram Stain, Rflx to Resp Cult         Respiratory (~20 pathogens) panel by PCR         CBC  Brain natriuretic peptide         Basic metabolic panel with GFR         Strep pneumoniae urinary antigen         Legionella Pneumophila Serogp 1 Ur Ag         Procalcitonin         CBC         Comprehensive metabolic panel       Review of Systems:  Review of Systems  Constitutional:  Negative for chills, fever, malaise/fatigue and weight loss.  Respiratory:  Positive for shortness of breath. Negative for cough, sputum production and wheezing.   Cardiovascular:   Positive for orthopnea and PND. Negative for chest pain, palpitations, claudication and leg swelling.  Gastrointestinal:  Negative for abdominal pain, heartburn, nausea and vomiting.  Genitourinary:  Negative for dysuria, frequency and urgency.  Musculoskeletal:  Negative for myalgias.  Neurological:  Negative for dizziness and headaches.  Psychiatric/Behavioral:  The patient is not nervous/anxious.     Past Medical History:  Diagnosis Date   Allergy    States his nose runs when he is around grease.    Anxiety    Arthritis    Atrial fibrillation (HCC)    Cancer (HCC)    Coronary artery disease    History of total knee replacement    Hypertension    Hypothyroidism    Kidney cancer, primary, with metastasis from kidney to other site [facility])    Kidney stone    OSA (obstructive sleep apnea)    pt does not wear cpap at night   Pituitary cyst (HCC)    Pneumonia    hx of   Prostate cancer [facility])     Past Surgical History:  Procedure Laterality Date   ABLATION OF DYSRHYTHMIC FOCUS  01/28/2018   ANTERIOR CERVICAL DECOMP/DISCECTOMY FUSION N/A 08/29/2012   Procedure: ANTERIOR CERVICAL DECOMPRESSION/DISCECTOMY FUSION 1 LEVEL;  Surgeon: Alm GORMAN Molt, MD;  Location: [facility] NEURO ORS;  Service: Neurosurgery;  Laterality: N/A;   APPENDECTOMY     ATRIAL FIBRILLATION ABLATION N/A 01/28/2018   Procedure: ATRIAL FIBRILLATION ABLATION;  Surgeon: Inocencio Soyla Lunger, MD;  Location: [facility] INVASIVE CV LAB;  Service: Cardiovascular;  Laterality: N/A;   CARDIAC CATHETERIZATION  2008   clean   CHOLECYSTECTOMY     COLONOSCOPY W/ POLYPECTOMY     CRANIOTOMY N/A 11/08/2012   Procedure: CRANIOTOMY HYPOPHYSECTOMY TRANSNASAL APPROACH;  Surgeon: Darina MALVA Boehringer, MD;  Location: [facility] NEURO ORS;  Service: Neurosurgery;  Laterality: N/A;  Transphenoidal Resection of Pituitary Tumor   FINGER SURGERY Left 2017   Dr Camella   INSERTION PROSTATE RADIATION SEED     IR THORACENTESIS ASP PLEURAL SPACE W/IMG GUIDE  04/15/2018    KIDNEY STONE SURGERY     KNEE ARTHROSCOPY  2007   left   NEPHRECTOMY Left    POSTERIOR CERVICAL LAMINECTOMY Left 04/24/2015   Procedure: Foraminotomy cervical five - cervical six cervical six - cervical seven left;  Surgeon: Alm GORMAN Molt, MD;  Location: [facility] NEURO ORS;  Service: Neurosurgery;  Laterality: Left;   REPLACEMENT TOTAL KNEE BILATERAL     RIGHT/LEFT HEART CATH AND CORONARY ANGIOGRAPHY N/A 07/14/2019   Procedure: RIGHT/LEFT HEART CATH AND CORONARY ANGIOGRAPHY;  Surgeon: Dann Candyce GORMAN, MD;  Location: [facility] CV LAB;  Service: Cardiovascular;  Laterality: N/A;     reports that he has never smoked. He has never used smokeless tobacco. He reports current alcohol  use of about 2.0 standard drinks of alcohol   per week. He reports that he does not use drugs.  Allergies  Allergen Reactions   Ambien  [Zolpidem ] Anxiety and Other (See Comments)    Anxiety and agitation when tried as sleeper in hospital aug 2019    Family History  Problem Relation Age of Onset   Cancer Mother        stomach, died when he was young   Cancer Father        ?mouth, died before patient born   Kidney cancer Sister    Colon cancer Brother 69   Gastric cancer Son 19       stomach cancer per pt   Esophageal cancer Neg Hx    Rectal cancer Neg Hx    Stomach cancer Neg Hx     Prior to Admission medications   Medication Sig Start Date End Date Taking? Authorizing Provider  acetaminophen  (TYLENOL ) 325 MG tablet Take 2 tablets (650 mg total) by mouth every 6 (six) hours as needed for mild pain (pain score 1-3) or fever (or Fever >/= 101). 12/24/23   Arrien, Elidia Sieving, MD  albuterol  (PROVENTIL ) (2.5 MG/3ML) 0.083% nebulizer solution Take 3 mLs (2.5 mg total) by nebulization every 6 (six) hours as needed for wheezing or shortness of breath. 10/12/22   Parrett, Madelin RAMAN, NP  albuterol  (VENTOLIN  HFA) 108 (90 Base) MCG/ACT inhaler INHALE 2 PUFFS BY MOUTH EVERY 6 HOURS AS NEEDED FOR WHEEZING OR SHORTNESS OF  BREATH Patient taking differently: Inhale 2 puffs into the lungs every 6 (six) hours as needed for wheezing or shortness of breath. 07/13/23   Parrett, Madelin RAMAN, NP  budesonide  (PULMICORT ) 0.25 MG/2ML nebulizer solution Take 2 mLs (0.25 mg total) by nebulization 2 (two) times daily. Patient not taking: Reported on 12/21/2023 09/07/23   Jillian Buttery, MD  busPIRone  (BUSPAR ) 5 MG tablet Take 5 mg by mouth at bedtime as needed (anxiety). 11/03/23   [provider]  empagliflozin  (JARDIANCE ) 10 MG TABS tablet Take 1 tablet (10 mg total) by mouth daily. 12/25/23   Arrien, Mauricio Daniel, MD  escitalopram  (LEXAPRO ) 20 MG tablet TAKE 1 TABLET BY MOUTH DAILY 06/07/23   Katrinka Garnette KIDD, MD  furosemide  (LASIX ) 40 MG tablet Take 1 tablet (40 mg total) by mouth daily. 12/24/23   Arrien, Mauricio Daniel, MD  gabapentin  (NEURONTIN ) 300 MG capsule Take 1 capsule (300 mg total) by mouth 3 (three) times daily. 12/25/22   Katrinka Garnette KIDD, MD  ipratropium-albuterol  (DUONEB) 0.5-2.5 (3) MG/3ML SOLN Take 3 mLs by nebulization every 6 (six) hours as needed. Patient taking differently: Take 3 mLs by nebulization every 6 (six) hours as needed (wheezing or shortness of breath). 09/07/23   Jillian Buttery, MD  LORazepam  (ATIVAN ) 0.5 MG tablet Take 1 tablet (0.5 mg total) by mouth daily as needed for anxiety (do not take within 8 hours of bed. do not drive for 8 hours after taking.). Patient taking differently: Take 0.5 mg by mouth daily as needed for anxiety. 04/30/23   Katrinka Garnette KIDD, MD  ondansetron  (ZOFRAN ) 4 MG tablet Take 1 tablet (4 mg total) by mouth every 6 (six) hours as needed for nausea. 09/17/23   Will Almarie MATSU, MD  OXYGEN  Inhale 5 L into the lungs continuous.    [provider]  spironolactone  (ALDACTONE ) 25 MG tablet Take 1 tablet (25 mg total) by mouth daily. 12/25/23 01/24/24  Arrien, Elidia Sieving, MD  traZODone  (DESYREL ) 50 MG tablet Take 1 tablet (50 mg total) by mouth at bedtime.  12/03/23   Katrinka Garnette KIDD, MD     Physical Exam: Vitals:   12/31/23 0028 12/31/23 0030 12/31/23 0031 12/31/23 0145  BP:  (!) 119/56  126/74  Pulse:  91  92  Resp:  (!) 28 (!) 28 (!) 30  Temp:    98.9 F (37.2 C)  TempSrc:    Oral  SpO2:  95%  99%  Weight: 107 kg       Physical Exam Constitutional:      Appearance: He is obese. He is not ill-appearing.  Eyes:     Pupils: Pupils are equal, round, and reactive to light.  Cardiovascular:     Rate and Rhythm: Normal rate and regular rhythm.  Pulmonary:     Effort: Pulmonary effort is normal.     Breath sounds: Normal breath sounds. No decreased breath sounds, wheezing, rhonchi or rales.  Skin:    Capillary Refill: Capillary refill takes less than 2 seconds.  Neurological:     Mental Status: He is alert and oriented to person, place, and time.  Psychiatric:        Mood and Affect: Mood normal. Mood is not anxious.        Behavior: Behavior normal.      Labs on Admission: I have personally reviewed following labs and imaging studies  CBC: Recent Labs  Lab 12/31/23 0040  WBC 9.7  HGB 14.0  HCT 43.2  MCV 92.9  PLT 349   Basic Metabolic Panel: Recent Labs  Lab 12/24/23 0305  NA 133*  K 4.2  CL 93*  CO2 31  GLUCOSE 95  BUN 20  CREATININE 1.14  CALCIUM 8.8*   GFR: Estimated Creatinine Clearance: 63.3 mL/min (by C-G formula based on SCr of 1.14 mg/dL). Liver Function Tests: No results for input(s): AST, ALT, ALKPHOS, BILITOT, PROT, ALBUMIN  in the last 168 hours. No results for input(s): LIPASE, AMYLASE in the last 168 hours. No results for input(s): AMMONIA in the last 168 hours. Coagulation Profile: No results for input(s): INR, PROTIME in the last 168 hours. Cardiac Enzymes: Recent Labs  Lab 12/31/23 0117  TROPONINIHS 4   BNP (last 3 results) Recent Labs    11/28/23 1611 12/20/23 2333 12/31/23 0116  BNP 65.1 92.4 41.3   HbA1C: No results for input(s): HGBA1C in the  last 72 hours. CBG: No results for input(s): GLUCAP in the last 168 hours. Lipid Profile: No results for input(s): CHOL, HDL, LDLCALC, TRIG, CHOLHDL, LDLDIRECT in the last 72 hours. Thyroid  Function Tests: No results for input(s): TSH, T4TOTAL, FREET4, T3FREE, THYROIDAB in the last 72 hours. Anemia Panel: No results for input(s): VITAMINB12, FOLATE, FERRITIN, TIBC, IRON, RETICCTPCT in the last 72 hours. Urine analysis:    Component Value Date/Time   COLORURINE AMBER (A) 04/20/2019 1438   APPEARANCEUR CLEAR 04/20/2019 1438   LABSPEC 1.017 04/20/2019 1438   PHURINE 5.0 04/20/2019 1438   GLUCOSEU NEGATIVE 04/20/2019 1438   HGBUR MODERATE (A) 04/20/2019 1438   HGBUR 2+ 09/22/2007 1444   BILIRUBINUR NEGATIVE 04/20/2019 1438   BILIRUBINUR n 11/07/2015 1417   KETONESUR NEGATIVE 04/20/2019 1438   PROTEINUR NEGATIVE 04/20/2019 1438   UROBILINOGEN 1.0 11/07/2015 1417   UROBILINOGEN 1.0 05/22/2011 1454   NITRITE NEGATIVE 04/20/2019 1438   LEUKOCYTESUR NEGATIVE 04/20/2019 1438    Radiological Exams on Admission: I have personally reviewed images DG Chest Portable 1 View Result Date: 12/31/2023 CLINICAL DATA:  Sob  Increasing shortness of breath, COPD, CHF EXAM: PORTABLE CHEST 1 VIEW COMPARISON:  Chest x-ray 12/22/2023 FINDINGS: The heart and mediastinal contours are unchanged. Atherosclerotic plaque. Interval increase in right lung patchy airspace opacities as well as left lower lobe and lingular patchy airspace opacities. No pulmonary edema. Bilateral trace to small volume pleural effusion. No pneumothorax. No acute osseous abnormality. IMPRESSION: 1. Interval increase in right lung patchy airspace opacities as well as left lower lobe and lingular patchy airspace opacities. Findings suggestive of multifocal pneumonia. Followup PA and lateral chest X-ray is recommended in 3-4 weeks following therapy to ensure resolution. 2. Bilateral trace to small volume pleural  effusion. 3.  Aortic Atherosclerosis (ICD10-I70.0). Electronically Signed   By: Morgane  Naveau M.D.   On: 12/31/2023 01:11     EKG: My personal interpretation of EKG shows: Normal sinus rhythm heart rate 91.    Assessment/Plan: Principal Problem:   Acute on chronic diastolic CHF (congestive heart failure) (HCC) Active Problems:   Community acquired pneumonia   CKD (chronic kidney disease), stage II   History of COPD   Generalized anxiety disorder   Obstructive sleep apnea   Chronic hypoxic respiratory failure (HCC)   COPD (chronic obstructive pulmonary disease) (HCC)   Morbid obesity (HCC)   History of CAD (coronary artery disease)   Acute on chronic combined systolic and diastolic CHF (congestive heart failure) (HCC)    Assessment and Plan: Acute on chronic diastolic heart failure exacerbation Respiratory distress secondary to CHF exacerbation -Presented to emergency department sudden onset of shortness of breath even though patient has been compliant with Lasix  per patient's report.  EMS reported O2 sat above 90% on 5 L oxygen  which is baseline.  EMS gave Solu-Medrol  125 mg.  Patient denies any wheezing, Cough, chill and fever. - At presentation to ED patient has shortness of breath however maintaining O2 sat at baseline. - Hemodynamically stable.  Normal BNP 41.  CBC unremarkable.  Pending BMP. - Chest x-ray showed evidence of pulmonary vascular congestion and airspace opacities concern for multifocal pneumonia. -Per chart review patient was admitted with 1 week ago for CHF exacerbation during that time patient also had normal BNP however found to be in CHF exacerbation.  Echo from 6/24 showed preserved EF 55 to 60% and grade 1 diastolic heart failure. - In the ED due to respiratory distress and shortness of breath received Lasix  40 mg IV. - At home patient is on Lasix  40 mg daily.  Continue IV Lasix  20 mg twice daily. -Strict I's/O, daily weight.  Need to monitor urine  output. - Continue Aldactone  and Jardiance . -Patient declined to use BiPAP.  Will continue CPAP at bedtime only.   Community-acquired pneumonia Low suspicion for pneumonia at this time however given chest x-ray showing evidence of pneumonia continue empiric antibiotic with IV ceftriaxone  and doxycycline .  Checking respiratory panel, sputum culture, blood culture, urine Legionella, urine strep antigen.  If procalcitonin negative can DC the antibiotic.   History of COPD Chronic hypoxic respiratory failure -History of chronic hypoxic respiratory failure 5 due to oxygen  baseline show at this time there is no concern for COPD exacerbation - Continue Pulmicort  twice daily and DuoNeb as needed for wheezing shortness of breath.   Generalized anxiety disorder -Continue BuSpar   Obstructive sleep apnea Morbid obesity Obstructive sleep apnea - Continue CPAP at bedtime   History of CAD -Per chart review patient has history of asthma atrial fibrillation and found to have A-fib RVR in 2021.  Patient underwent nuclear medicine stress test which showed abnormal wall motion abnormality.  Patient has been offered  Cardizem , metoprolol  and Eliquis  5 mg twice daily.  Seems like that patient has been on beta-blocker and anticoagulation for a while however afterward patient declined all the medications to be continued. It seems like patient has been lost follow-up cardiology after 2021. -On discharge need to make sure patient has been referred to cardiology to establish care.  History of paroxysmal atrial fibrillation -EKG showed normal sinus rhythm heart rate 91.  Patient declined anticoagulation in the past.   Peripheral neuropathy - Continue gabapentin   DVT prophylaxis:  Lovenox  Code Status:  Full Code Diet: Heart healthy diet with fluid restriction 2 L/day salt restriction 2 g/day Family Communication:   Family was present at bedside, at the time of interview. Opportunity was given to ask question  and all questions were answered satisfactorily.  Disposition Plan: Continue to monitor improvement of shortness of breath monitor urine output.  If procalcitonin negative can discontinue IV antibiotics. Consults: None indicated at this time Admission status:   Inpatient, Telemetry bed  Severity of Illness: The appropriate patient status for this patient is INPATIENT. Inpatient status is judged to be reasonable and necessary in order to provide the required intensity of service to ensure the patient's safety. The patient's presenting symptoms, physical exam findings, and initial radiographic and laboratory data in the context of their chronic comorbidities is felt to place them at high risk for further clinical deterioration. Furthermore, it is not anticipated that the patient will be medically stable for discharge from the hospital within 2 midnights of admission.   * I certify that at the point of admission it is my clinical judgment that the patient will require inpatient hospital care spanning beyond 2 midnights from the point of admission due to high intensity of service, high risk for further deterioration and high frequency of surveillance required.DEWAINE    Tej Murdaugh, MD [facility]  How to contact the [facility] Attending or Consulting provider 7A - 7P or covering provider during after hours 7P -7A, for this patient.  Check the care team in [facility] and look for a) attending/consulting [facility] provider listed and b) the [facility] team listed Log into [email or web address] and use 's universal password to access. If you do not have the password, please contact the hospital operator. Locate the [facility] provider you are looking for under [facility] and page to a number that you can be directly reached. If you still have difficulty reaching the provider, please page the [facility] (Director on Call) for the Hospitalists listed on amion for assistance.  12/31/2023, 2:51 AM

## 2024-01-01 DIAGNOSIS — I5033 Acute on chronic diastolic (congestive) heart failure: Secondary | ICD-10-CM | POA: Diagnosis not present

## 2024-01-01 LAB — BASIC METABOLIC PANEL WITH GFR
Anion gap: 10 (ref 5–15)
BUN: 22 mg/dL (ref 8–23)
CO2: 28 mmol/L (ref 22–32)
Calcium: 9.1 mg/dL (ref 8.9–10.3)
Chloride: 97 mmol/L — ABNORMAL LOW (ref 98–111)
Creatinine, Ser: 1.14 mg/dL (ref 0.61–1.24)
GFR, Estimated: >60 mL/min (ref >60)
Glucose, Bld: 132 mg/dL — ABNORMAL HIGH (ref 70–99)
Potassium: 3.9 mmol/L (ref 3.5–5.1)
Sodium: 135 mmol/L (ref 135–145)

## 2024-01-01 LAB — LEGIONELLA PNEUMOPHILA SEROGP 1 UR AG: L. pneumophila Serogp 1 Ur Ag: NEGATIVE

## 2024-01-01 MED ORDER — TRAMADOL HCL 50 MG PO TABS
50.0000 mg | ORAL_TABLET | Freq: Four times a day (QID) | ORAL | Status: DC | PRN
  Administered 2024-01-01: 50 mg via ORAL
  Filled 2024-01-01: qty 1

## 2024-01-01 NOTE — Progress Notes (Signed)
 PROGRESS NOTE    [name]  FMW:[id number] DOB: [birth year] DOA: 12/31/2023 PCP: Katrinka Garnette KIDD, MD   Brief Narrative: Wayne Booth is a 83 y.o. male with a history of COPD, heart failure with preserved EF, chronic respite failure, paroxysmal atrial fibrillation, CKD stage II, CAD, obstructive sleep apnea, obesity, interstitial lung disease, gout, general anxiety disorder, peripheral neuropathy.  Patient presented secondary to shortness of breath and was found to have evidence of likely acute on chronic heart failure in addition to possible pneumonia.  Patient was on Lasix  IV for diuresis and started on antibiotics for treatment of presumed pneumonia.   Assessment and Plan:  Acute on chronic diastolic heart failure Recent echo from June 2025 significant for an LVEF of 55 to 60% with grade 1 diastolic dysfunction. BNP obtained and is low at 41.3. High sensitivity troponin of only 4.  Patient started on Lasix  IV for treatment.  Patient reports improved symptoms of dyspnea this morning. PT/OT recommending home health. -PT/OT evaluation  Respiratory distress Secondary to acute..  Atypical pneumonia Patient with symptoms of dyspnea, but no chest pain or productive cough. Patient with chest x-ray evidence of increased right lung and left lower lobe/lingular patchy airspace opacities suggesting multifocal pneumonia. No leukocytosis and procalcitonin is undetectable.  Patient started empirically on ceftriaxone  and doxycycline .  Respiratory virus panel was negative as well.  Difficult to parse out if this is true infection or not, however does not appear to be the case. - Continue doxycycline  and switch to PO in AM  History of COPD History of ILD Patient follows with pulmonology as an outpatient but has multiple no-shows, per outpatient pulmonology office records. Last follow-up appears to be over one year prior. Patient is prescribed albuterol /DuoNeb and Pulmicort . -Continue  Pulmicort  -Continue DuoNeb as needed  Chronic respiratory failure Patient is on 5 L/min of supplemental oxygen  at baseline. Stable.  History of CAD Noted. Not currently on medication therapy and has not been following up with cardiology.  Paroxysmal atrial fibrillation Noted. Currently in sinus rhythm. Per chart review, patient has declined anticoagulation in the past and is not on rate or rhythm control medication therapy at this time.  Peripheral neuropathy -Continue gabapentin   Generalized anxiety disorder -Continue BuSpar   OSA -Continue CPAP at bedtime  Pressure injury Stage 3, buttock. Wound care consulted. -Wound care nurse recommendations (7/4): Dressing procedure/placement/frequency: Cleanse scrotal wound with NS, apply Xeroform gauze Soila [phone]) to wound bed daily and hold in place with ABD pad.  Cleanse buttocks wounds with NS, Apply Medihoney to wound beds daily and cover with dry gauze and silicone foam or ABD whichever is preferred.  Will order Gerhardt's to intact skin of buttocks 2 times a day and prn soiling.   Obesity, class I Estimated body mass index is 32.19 kg/m as calculated from the following:   Height as of this encounter: 5' 11 (1.803 m).   Weight as of this encounter: 104.7 kg.   DVT prophylaxis: Lovenox  Code Status:   Code Status: Full Code Family Communication: None at bedside Disposition Plan: Discharge home with home health likely in 24 hours pending transition off of IV Lasix    Consultants:  None  Procedures:  None  Antimicrobials: Ceftriaxone  Doxycycline     Subjective: Patient reports increase pain of his buttock area. No other issues overnight.  Objective: BP 125/74 (BP Location: Right Arm)   Pulse 65   Temp 98.3 F (36.8 C)   Resp 16   Ht 5' 11 (1.803  m)   Wt 104.7 kg   SpO2 98%   BMI 32.19 kg/m   Examination:  General exam: Appears calm and comfortable Respiratory system: Clear to auscultation. Respiratory  effort normal. Cardiovascular system: S1 & S2 heard, RRR.  Gastrointestinal system: Abdomen is nondistended, soft and nontender. Normal bowel sounds heard. Central nervous system: Alert and oriented. No focal neurological deficits. Psychiatry: Judgement and insight appear normal. Mood & affect appropriate.    Data Reviewed: I have personally reviewed following labs and imaging studies  CBC Lab Results  Component Value Date   WBC 9.2 12/31/2023   RBC 4.92 12/31/2023   HGB 14.4 12/31/2023   HCT 45.1 12/31/2023   MCV 91.7 12/31/2023   MCH 29.3 12/31/2023   PLT 321 12/31/2023   MCHC 31.9 12/31/2023   RDW 13.9 12/31/2023   LYMPHSABS 0.9 12/20/2023   MONOABS 0.6 12/20/2023   EOSABS 0.2 12/20/2023   BASOSABS 0.0 12/20/2023     Last metabolic panel Lab Results  Component Value Date   NA 135 01/01/2024   K 3.9 01/01/2024   CL 97 (L) 01/01/2024   CO2 28 01/01/2024   BUN 22 01/01/2024   CREATININE 1.14 01/01/2024   GLUCOSE 132 (H) 01/01/2024   GFRNONAA >60 01/01/2024   GFRAA 72 07/12/2019   CALCIUM 9.1 01/01/2024   PHOS 5.4 (H) 02/23/2018   PROT 8.3 (H) 12/31/2023   ALBUMIN  3.4 (L) 12/31/2023   BILITOT 0.6 12/31/2023   ALKPHOS 78 12/31/2023   AST 24 12/31/2023   ALT 22 12/31/2023   ANIONGAP 10 01/01/2024    GFR: Estimated Creatinine Clearance: 62.6 mL/min (by C-G formula based on SCr of 1.14 mg/dL).  Recent Results (from the past 240 hours)  Culture, blood (routine x 2) Call MD if unable to obtain prior to antibiotics being given     Status: None (Preliminary result)   Collection Time: 12/31/23  4:12 AM   Specimen: BLOOD LEFT ARM  Result Value Ref Range Status   Specimen Description   Final    BLOOD LEFT ARM Performed at [facility], [address], [place], [place] [address]    Special Requests   Final    BOTTLES DRAWN AEROBIC ONLY Blood Culture results may not be optimal due to an inadequate volume of blood received in culture bottles Performed at [facility], [address] [place], [place] [address]    Culture   Final    NO GROWTH 1 DAY Performed at [facility], [address], [place], [place] [address]    Report Status PENDING  Incomplete  Culture, blood (routine x 2) Call MD if unable to obtain prior to antibiotics being given     Status: None (Preliminary result)   Collection Time: 12/31/23  4:17 AM   Specimen: BLOOD RIGHT ARM  Result Value Ref Range Status   Specimen Description   Final    BLOOD RIGHT ARM Performed at [facility], [address], [place], [place] [address]    Special Requests   Final    BOTTLES DRAWN AEROBIC ONLY Blood Culture results may not be optimal due to an inadequate volume of blood received in culture bottles Performed at [facility], [address] [place], [place] [address]    Culture   Final    NO GROWTH 1 DAY Performed at [facility], [address] [place], [place] [address]    Report Status PENDING  Incomplete  Respiratory (~20 pathogens) panel by PCR     Status: None   Collection Time: 12/31/23  7:00 AM   Specimen: Nasopharyngeal Swab; Respiratory  Result Value Ref Range Status   Adenovirus NOT DETECTED NOT DETECTED Final   Coronavirus 229E NOT DETECTED NOT DETECTED Final    Comment: (NOTE) The Coronavirus on the Respiratory Panel, DOES NOT test for the novel  Coronavirus (2019 nCoV)    Coronavirus HKU1 NOT DETECTED NOT DETECTED Final   Coronavirus NL63 NOT DETECTED NOT DETECTED Final   Coronavirus OC43 NOT DETECTED NOT DETECTED Final   Metapneumovirus NOT DETECTED NOT DETECTED Final   Rhinovirus / Enterovirus NOT DETECTED NOT DETECTED Final   Influenza A NOT DETECTED NOT DETECTED Final   Influenza B NOT DETECTED NOT DETECTED Final   Parainfluenza Virus 1 NOT DETECTED NOT DETECTED Final   Parainfluenza Virus 2 NOT DETECTED NOT DETECTED Final   Parainfluenza Virus 3 NOT DETECTED NOT DETECTED Final   Parainfluenza Virus 4  NOT DETECTED NOT DETECTED Final   Respiratory Syncytial Virus NOT DETECTED NOT DETECTED Final   Bordetella pertussis NOT DETECTED NOT DETECTED Final   Bordetella Parapertussis NOT DETECTED NOT DETECTED Final   Chlamydophila pneumoniae NOT DETECTED NOT DETECTED Final   Mycoplasma pneumoniae NOT DETECTED NOT DETECTED Final    Comment: Performed at [facility], [address] [place], [place] [address]      Radiology Studies: DG Chest Portable 1 View Result Date: 12/31/2023 CLINICAL DATA:  Sob  Increasing shortness of breath, COPD, CHF EXAM: PORTABLE CHEST 1 VIEW COMPARISON:  Chest x-ray 12/22/2023 FINDINGS: The heart and mediastinal contours are unchanged. Atherosclerotic plaque. Interval increase in right lung patchy airspace opacities as well as left lower lobe and lingular patchy airspace opacities. No pulmonary edema. Bilateral trace to small volume pleural effusion. No pneumothorax. No acute osseous abnormality. IMPRESSION: 1. Interval increase in right lung patchy airspace opacities as well as left lower lobe and lingular patchy airspace opacities. Findings suggestive of multifocal pneumonia. Followup PA and lateral chest X-ray is recommended in 3-4 weeks following therapy to ensure resolution. 2. Bilateral trace to small volume pleural effusion. 3.  Aortic Atherosclerosis (ICD10-I70.0). Electronically Signed   By: Morgane  Naveau M.D.   On: 12/31/2023 01:11      LOS: 1 day    Elgin Lam, MD [facility] 01/01/2024, 12:52 PM   If 7PM-7AM, please contact night-coverage [email or web address]

## 2024-01-01 NOTE — Progress Notes (Signed)
   01/01/24 1944  BiPAP/CPAP/SIPAP  BiPAP/CPAP/SIPAP Pt Type Adult  Reason BIPAP/CPAP not in use Non-compliant

## 2024-01-01 NOTE — TOC Transition Note (Incomplete)
 Transition of Care [facility]) - Discharge Note   Patient Details  Name: Wayne Booth MRN: [id number] Date of Birth: June 29, 1941  [facility] of [facility]) CM/SW Contact:  Tawni CHRISTELLA Eva, LCSW Phone Number: 01/01/2024, 11:32 AM   Clinical Narrative:     Pt to d/c home with home health, CSW confirmed pt is active with [facility] home health   Final next level of care: Home w Home Health Services Barriers to Discharge: Barriers Resolved   Patient Goals and CMS Choice Patient states their goals for this hospitalization and ongoing recovery are:: retrun home with home health   Choice offered to / list presented to : Patient      Discharge Placement                    Patient and family notified of of transfer: 01/01/24  Discharge Plan and Services Additional resources added to the After Visit Summary for                                       Social Drivers of Health (SDOH) Interventions SDOH Screenings   Food Insecurity: No Food Insecurity (12/31/2023)  Housing: Low Risk  (12/31/2023)  Transportation Needs: No Transportation Needs (12/31/2023)  Utilities: Not At Risk (12/31/2023)  Alcohol  Screen: Low Risk  (12/22/2023)  Depression (PHQ2-9): High Risk (02/11/2023)  Financial Resource Strain: Low Risk  (12/22/2023)  Physical Activity: Inactive (01/04/2023)  Social Connections: Moderately Integrated (12/31/2023)  Stress: No Stress Concern Present (01/04/2023)  Tobacco Use: Low Risk  (12/31/2023)     Readmission Risk Interventions    12/22/2023    5:01 PM 10/20/2023   11:54 AM  Readmission Risk Prevention Plan  Transportation Screening Complete Complete  PCP or Specialist Appt within 5-7 Days  Complete  PCP or Specialist Appt within 3-5 Days Complete   Home Care Screening  Complete  Medication Review (RN CM)  Complete  HRI or Home Care Consult Complete   Palliative Care Screening Not Applicable   Medication Review (RN Care Manager) Complete

## 2024-01-01 NOTE — Plan of Care (Signed)
  Problem: Clinical Measurements: Goal: Cardiovascular complication will be avoided Outcome: Progressing   Problem: Nutrition: Goal: Adequate nutrition will be maintained Outcome: Progressing   Problem: Elimination: Goal: Will not experience complications related to bowel motility Outcome: Progressing   Problem: Elimination: Goal: Will not experience complications related to urinary retention Outcome: Progressing   Problem: Safety: Goal: Ability to remain free from injury will improve Outcome: Progressing

## 2024-01-01 NOTE — Evaluation (Signed)
 Occupational Therapy Evaluation Patient Details Name: Wayne Booth MRN: [id number] DOB: [birth year] Today's Date: 01/01/2024   History of Present Illness   83 y.o. male with a history of COPD, heart failure with preserved EF, chronic respite failure, paroxysmal atrial fibrillation, CKD stage II, CAD, obstructive sleep apnea, obesity, interstitial lung disease, gout, general anxiety disorder, peripheral neuropathy.  Patient presented secondary to shortness of breath and was found to have evidence of likely acute on chronic heart failure in addition to CAP     Clinical Impressions PTA, patient lives with wife with O2 at baseline and mod I level with all A/IADL's and mobility including driving and community access. Currently, patient presents with deficits outlined below (see OT Problem List for details) most significantly pain and decreased skin integrity in sacral and scrotal regions, decreased activity tolerance and balance limiting BADL's and functional mobility. Patient requires continued Acute care hospital level OT services to progress safety and functional performance and allow for discharge. Patient reports his wife can assist as needed thus OT anticipates with support and assist from family recommending HHOT services upon discharge.    Note: walking sats requested and taken: SpO2 on 4 ltrs 97% at rest, 94 % amb and standing activity, removed O2 for 2 minutes for oral and face/hair care with 92% SpO2 on RA, amb 25 more feet on 4 ltrs O2 and returned to 98% seated on 4 ltrs    If plan is discharge home, recommend the following:   A little help with walking and/or transfers;A little help with bathing/dressing/bathroom;Assistance with cooking/housework;Assist for transportation;Help with stairs or ramp for entrance     Functional Status Assessment   Patient has had a recent decline in their functional status and demonstrates the ability to make significant improvements in function in a  reasonable and predictable amount of time.     Equipment Recommendations   None recommended by OT      Precautions/Restrictions   Precautions Precautions: Fall Precaution/Restrictions Comments: monitor sats Restrictions Weight Bearing Restrictions Per Provider Order: No     Mobility Bed Mobility               General bed mobility comments: not assessed, pt reports sleeping in recliner--pt in recliner and returned to same    Transfers Overall transfer level: Needs assistance Equipment used: Rolling walker (2 wheels) Transfers: Sit to/from Stand, Bed to chair/wheelchair/BSC Sit to Stand: Supervision     Step pivot transfers: Contact guard assist     General transfer comment: min cues for hand placement, amb in room, in and out of bathroom with RW and OT amnaging O2 tank      Balance Overall balance assessment: Needs assistance Sitting-balance support: Feet supported, No upper extremity supported Sitting balance-Leahy Scale: Good     Standing balance support: During functional activity, Reliant on assistive device for balance Standing balance-Leahy Scale: Fair Standing balance comment: incr reliance on RW with incr fatigue                           ADL either performed or assessed with clinical judgement   ADL Overall ADL's : Needs assistance/impaired Eating/Feeding: Independent   Grooming: Wash/dry hands;Wash/dry face;Oral care;Brushing hair;Standing;Contact guard assist   Upper Body Bathing: Set up;Sitting   Lower Body Bathing: Contact guard assist;Sit to/from stand   Upper Body Dressing : Modified independent;Sitting   Lower Body Dressing: Contact guard assist;Sit to/from stand   Toilet Transfer: Contact guard assist;Rolling  walker (2 wheels);Grab bars   Toileting- Clothing Manipulation and Hygiene:  (has condom catheter in place but amble to use urinal seated with mod I)   Tub/ Shower Transfer: Walk-in shower;Contact guard  assist;Rolling walker (2 wheels)   Functional mobility during ADLs: Contact guard assist;Rolling walker (2 wheels) General ADL Comments: min cues for breathing strategies     Vision Baseline Vision/History: 0 No visual deficits Ability to See in Adequate Light: 0 Adequate Patient Visual Report: No change from baseline              Pertinent Vitals/Pain Pain Assessment Pain Assessment: Faces Faces Pain Scale: Hurts little more Pain Location: scrotum and buttocks Pain Descriptors / Indicators: Sore, Tender Pain Intervention(s): Premedicated before session, Monitored during session, Repositioned     Extremity/Trunk Assessment Upper Extremity Assessment Upper Extremity Assessment: Right hand dominant;Overall [facility] for tasks assessed   Lower Extremity Assessment Lower Extremity Assessment: Overall WFL for tasks assessed   Cervical / Trunk Assessment Cervical / Trunk Assessment: Kyphotic   Communication Communication Communication: No apparent difficulties   Cognition Arousal: Alert Behavior During Therapy: WFL for tasks assessed/performed Cognition: No apparent impairments                               Following commands: Intact       Cueing  General Comments   Cueing Techniques: Verbal cues;Gestural cues  walking sats taken: SpO2 on 4 ltrs 97% at rest, 94 % amb and standing activity, removed O2 for 2 minutes for oral and face/hair care with 92% SpO2 on RA, amb 25 more feet on 4 ltrs O2 and returned to 98% seated on 4 ltrs; TEDS on  and no edema noted, nursing addressed sacral and buttocks wounds prior to session           Home Living Family/patient expects to be discharged to:: Private residence Living Arrangements: Spouse/significant other Available Help at Discharge: Family;Available 24 hours/day Type of Home: House Home Access: Level entry     Home Layout: Multi-level;Able to live on main level with bedroom/bathroom Alternate Level  Stairs-Number of Steps: 12 Alternate Level Stairs-Rails: Right Bathroom Shower/Tub: Producer, television/film/video: Standard Bathroom Accessibility: Yes How Accessible: Accessible via walker Home Equipment: Shower seat - built Charity fundraiser (2 wheels);Cane - single point;Hand held shower head   Additional Comments: 5L reports using it as needed      Prior Functioning/Environment Prior Level of Function : Independent/Modified Independent;Driving             Mobility Comments: reports ind and use of up to 5 trs of O2 at baseline ADLs Comments: indep, uses shower seat    OT Problem List: Decreased activity tolerance;Impaired balance (sitting and/or standing);Cardiopulmonary status limiting activity;Pain   OT Treatment/Interventions: Self-care/ADL training;Therapeutic exercise;Energy conservation;DME and/or AE instruction;Therapeutic activities;Patient/family education;Balance training      OT Goals(Current goals can be found in the care plan section)   Acute Rehab OT Goals Patient Stated Goal: to feel better OT Goal Formulation: With patient Time For Goal Achievement: 01/15/24 Potential to Achieve Goals: Good   OT Frequency:  Min 2X/week       AM-PAC OT 6 Clicks Daily Activity     Outcome Measure Help from another person eating meals?: None Help from another person taking care of personal grooming?: None Help from another person toileting, which includes using toliet, bedpan, or urinal?: A Little Help from another person bathing (  including washing, rinsing, drying)?: A Little Help from another person to put on and taking off regular upper body clothing?: None Help from another person to put on and taking off regular lower body clothing?: A Little 6 Click Score: 21   End of Session Equipment Utilized During Treatment: Gait belt;Rolling walker (2 wheels);Oxygen  Nurse Communication: Mobility status;Other (comment) (O2 walking sats)  Activity Tolerance: Patient  tolerated treatment well Patient left: in chair;with call bell/phone within reach;with chair alarm set  OT Visit Diagnosis: Unsteadiness on feet (R26.81);Pain Pain - part of body:  (sacral and buttocks)                Time: 8961-8884 OT Time Calculation (min): 37 min Charges:  OT General Charges $OT Visit: 1 Visit OT Evaluation $OT Eval Low Complexity: 1 Low OT Treatments $Self Care/Home Management : 8-22 mins  Alece Koppel OT/L Acute Rehabilitation Department  [phone]  01/01/2024, 11:36 AM

## 2024-01-02 DIAGNOSIS — I5033 Acute on chronic diastolic (congestive) heart failure: Secondary | ICD-10-CM | POA: Diagnosis not present

## 2024-01-02 MED ORDER — TRAMADOL HCL 50 MG PO TABS: 50.0000 mg | ORAL_TABLET | Freq: Four times a day (QID) | ORAL | 0 refills | Status: AC | PRN

## 2024-01-02 MED ORDER — DOXYCYCLINE HYCLATE 100 MG PO TABS
100.0000 mg | ORAL_TABLET | Freq: Once | ORAL | Status: AC
  Administered 2024-01-02: 100 mg via ORAL
  Filled 2024-01-02: qty 1

## 2024-01-02 MED ORDER — DOXYCYCLINE HYCLATE 100 MG PO TABS
100.0000 mg | ORAL_TABLET | Freq: Two times a day (BID) | ORAL | 0 refills | Status: AC
Start: 2024-01-02 — End: 2024-01-04

## 2024-01-02 NOTE — Progress Notes (Signed)
 Discharge instructions were reviewed with the patient. He denied questions, or concerns at this time. Pt states that his spouse will have home oxygen  tank in the car for transport.

## 2024-01-02 NOTE — TOC Transition Note (Signed)
 Transition of Care [facility]) - Discharge Note   Patient Details  Name: Wayne Booth MRN: [id number] Date of Birth: [birth year]  [facility] CM/SW Contact:  Tawni CHRISTELLA Eva, LCSW Phone Number: 01/02/2024, 11:23 AM   Clinical Narrative:    Pt to d/c home with home health, CSW confirmed pt is active with [place] home health. CSW spoke with [name], she reports pt will not have PT/OT due to being inappropriate with the male Therapists. [name] report they can take pt back for [facility] and aide. CSW met with pt at bedside to inform him he will have to follow up with OP Physical Therapy. Pt verbalized understanding and reported his wife will pick him up upon d/c. OP PT referral made . No further TOC needs TOC sign off.     Final next level of care: Home w Home Health Services Barriers to Discharge: Barriers Resolved   Patient Goals and CMS Choice Patient states their goals for this hospitalization and ongoing recovery are:: retrun home with home health   Choice offered to / list presented to : Patient      Discharge Placement                    Patient and family notified of of transfer: 01/01/24  Discharge Plan and Services Additional resources added to the After Visit Summary for                                       Social Drivers of Health (SDOH) Interventions SDOH Screenings   Food Insecurity: No Food Insecurity (12/31/2023)  Housing: Low Risk  (12/31/2023)  Transportation Needs: No Transportation Needs (12/31/2023)  Utilities: Not At Risk (12/31/2023)  Alcohol  Screen: Low Risk  (12/22/2023)  Depression (PHQ2-9): High Risk (02/11/2023)  Financial Resource Strain: Low Risk  (12/22/2023)  Physical Activity: Inactive (01/04/2023)  Social Connections: Moderately Integrated (12/31/2023)  Stress: No Stress Concern Present (01/04/2023)  Tobacco Use: Low Risk  (12/31/2023)     Readmission Risk Interventions    12/22/2023    5:01 PM 10/20/2023   11:54 AM   Readmission Risk Prevention Plan  Transportation Screening Complete Complete  PCP or Specialist Appt within 5-7 Days  Complete  PCP or Specialist Appt within 3-5 Days Complete   Home Care Screening  Complete  Medication Review (RN CM)  Complete  HRI or Home Care Consult Complete   Palliative Care Screening Not Applicable   Medication Review (RN Care Manager) Complete

## 2024-01-02 NOTE — Discharge Summary (Signed)
 Physician Discharge Summary   Patient: Wayne Booth MRN: [id number] DOB: 08/22/1940  Admit date:     12/31/2023  Discharge date: {dischdate:26783}  Discharge Physician: Elgin Lam   PCP: Katrinka Garnette KIDD, MD   Recommendations at discharge:  {Tip this will not be part of the note when signed- Example include specific recommendations for outpatient follow-up, pending tests to follow-up on. (Optional):26781}  ***  Discharge Diagnoses: Principal Problem:   Acute on chronic diastolic CHF (congestive heart failure) (HCC) Active Problems:   Community acquired pneumonia   CKD (chronic kidney disease), stage II   History of COPD   Generalized anxiety disorder   Obstructive sleep apnea   Chronic hypoxic respiratory failure (HCC)   COPD (chronic obstructive pulmonary disease) (HCC)   Morbid obesity (HCC)   History of CAD (coronary artery disease)   Acute on chronic combined systolic and diastolic CHF (congestive heart failure) (HCC)  Resolved Problems:   * No resolved hospital problems. *  Hospital Course: JERROD DAMIANO is a 83 y.o. male with a history of COPD, heart failure with preserved EF, chronic respite failure, paroxysmal atrial fibrillation, CKD stage II, CAD, obstructive sleep apnea, obesity, interstitial lung disease, gout, general anxiety disorder, peripheral neuropathy.  Patient presented secondary to shortness of breath and was found to have evidence of likely acute on chronic heart failure in addition to possible pneumonia.  Patient was on Lasix  IV for diuresis and started on antibiotics for treatment of presumed pneumonia.  Assessment and Plan: No notes have been filed under this hospital service. Service: Hospitalist     {Tip this will not be part of the note when signed Body mass index is 32.04 kg/m. , ,  (Optional):26781}  {(NOTE) Pain control PDMP Statment (Optional):26782} Consultants: *** Procedures performed: ***  Disposition: {Plan;  Disposition:26390} Diet recommendation:  {Diet_Plan:26776} DISCHARGE MEDICATION: Allergies as of 01/02/2024       Reactions   Ambien  [zolpidem ] Anxiety, Other (See Comments)   Anxiety and agitation when tried as sleeper in hospital aug 2019        Medication List     TAKE these medications    acetaminophen  325 MG tablet Commonly known as: TYLENOL  Take 2 tablets (650 mg total) by mouth every 6 (six) hours as needed for mild pain (pain score 1-3) or fever (or Fever >/= 101).   albuterol  (2.5 MG/3ML) 0.083% nebulizer solution Commonly known as: PROVENTIL  Take 3 mLs (2.5 mg total) by nebulization every 6 (six) hours as needed for wheezing or shortness of breath.   albuterol  108 (90 Base) MCG/ACT inhaler Commonly known as: VENTOLIN  HFA INHALE 2 PUFFS BY MOUTH EVERY 6 HOURS AS NEEDED FOR WHEEZING OR SHORTNESS OF BREATH   budesonide  0.25 MG/2ML nebulizer solution Commonly known as: PULMICORT  Take 2 mLs (0.25 mg total) by nebulization 2 (two) times daily. What changed:  when to take this reasons to take this   busPIRone  5 MG tablet Commonly known as: BUSPAR  Take 5 mg by mouth at bedtime as needed (anxiety).   doxycycline  100 MG tablet Commonly known as: VIBRA -TABS Take 1 tablet (100 mg total) by mouth 2 (two) times daily for 2 days.   escitalopram  20 MG tablet Commonly known as: LEXAPRO  TAKE 1 TABLET BY MOUTH DAILY   furosemide  40 MG tablet Commonly known as: LASIX  Take 1 tablet (40 mg total) by mouth daily.   gabapentin  300 MG capsule Commonly known as: NEURONTIN  Take 1 capsule (300 mg total) by mouth 3 (three) times daily.  ipratropium-albuterol  0.5-2.5 (3) MG/3ML Soln Commonly known as: DUONEB Take 3 mLs by nebulization every 6 (six) hours as needed.   Jardiance  10 MG Tabs tablet Generic drug: empagliflozin  Take 1 tablet (10 mg total) by mouth daily.   ondansetron  4 MG tablet Commonly known as: ZOFRAN  Take 1 tablet (4 mg total) by mouth every 6 (six) hours  as needed for nausea.   OXYGEN  Inhale 5 L into the lungs continuous.   spironolactone  25 MG tablet Commonly known as: ALDACTONE  Take 1 tablet (25 mg total) by mouth daily.   traMADol  50 MG tablet Commonly known as: ULTRAM  Take 1 tablet (50 mg total) by mouth every 6 (six) hours as needed for up to 3 days for severe pain (pain score 7-10).   traZODone  50 MG tablet Commonly known as: DESYREL  Take 1 tablet (50 mg total) by mouth at bedtime.        Follow-up Information     [facility] [place], [place] Follow up.   Why: home ehalth will follow up with you after discharge. Contact information: [name] [place] [address] [phone]         Katrinka Garnette KIDD, MD. Schedule an appointment as soon as possible for a visit in 1 week(s).   Specialty: Family Medicine Why: For hospital follow-up Contact information: [address] [place] [address] [phone]         Hunsucker, Donnice SAUNDERS, MD. Schedule an appointment as soon as possible for a visit.   Specialty: Pulmonary Disease Contact information: [address] [place] [address] [phone]                Discharge Exam: Fredricka Weights   12/31/23 0400 01/01/24 0700 01/02/24 0500  Weight: 107 kg 104.7 kg 104.2 kg   ***  Condition at discharge: {DC Condition:26389}  The results of significant diagnostics from this hospitalization (including imaging, microbiology, ancillary and laboratory) are listed below for reference.   Imaging Studies: DG Chest Portable 1 View Result Date: 12/31/2023 CLINICAL DATA:  Sob  Increasing shortness of breath, COPD, CHF EXAM: PORTABLE CHEST 1 VIEW COMPARISON:  Chest x-ray 12/22/2023 FINDINGS: The heart and mediastinal contours are unchanged. Atherosclerotic plaque. Interval increase in right lung patchy airspace opacities as well as left lower lobe and lingular patchy airspace opacities. No pulmonary  edema. Bilateral trace to small volume pleural effusion. No pneumothorax. No acute osseous abnormality. IMPRESSION: 1. Interval increase in right lung patchy airspace opacities as well as left lower lobe and lingular patchy airspace opacities. Findings suggestive of multifocal pneumonia. Followup PA and lateral chest X-ray is recommended in 3-4 weeks following therapy to ensure resolution. 2. Bilateral trace to small volume pleural effusion. 3.  Aortic Atherosclerosis (ICD10-I70.0). Electronically Signed   By: Morgane  Naveau M.D.   On: 12/31/2023 01:11   DG Chest 1 View Result Date: 12/22/2023 CLINICAL DATA:  Follow-up, shortness of breath. EXAM: CHEST  1 VIEW COMPARISON:  Chest x-ray 12/20/2023 FINDINGS: The heart is enlarged. Lung volumes are low. In right mid and lower lung and left lower lung infiltrates are present. There is a small left pleural effusion. There is no pneumothorax or acute fracture. IMPRESSION: 1. Bilateral infiltrates, right greater than left. 2. Small left pleural effusion. 3. Cardiomegaly. Electronically Signed   By: Greig Pique M.D.   On: 12/22/2023 15:58   ECHOCARDIOGRAM COMPLETE Result Date: 12/21/2023    ECHOCARDIOGRAM REPORT   Patient Name:  TANDA ORN Tappen Date of Exam: 12/21/2023 Medical Rec #:  [id number]       Height:       71.0 in Accession #:    [id number]      Weight:       231.5 lb Date of Birth:  1940/08/30      BSA:          2.244 m Patient Age:    82 years        BP:           112/74 mmHg Patient Gender: M               HR:           80 bpm. Exam Location:  Inpatient Procedure: 2D Echo, Cardiac Doppler, Color Doppler and Intracardiac            Opacification Agent (Both Spectral and Color Flow Doppler were            utilized during procedure). Indications:    CHF  History:        Patient has prior history of Echocardiogram examinations, most                 recent 08/31/2023.  Sonographer:    AB Referring Phys: [id number] SUBRINA SUNDIL IMPRESSIONS  1. Left ventricular  ejection fraction, by estimation, is 55 to 60%. Left ventricular ejection fraction by 2D MOD biplane is 55.4 %. The left ventricle has normal function. The left ventricle has no regional wall motion abnormalities. Left ventricular diastolic parameters are consistent with Grade I diastolic dysfunction (impaired relaxation).  2. Right ventricular systolic function is normal. The right ventricular size is not well visualized.  3. The mitral valve is degenerative. No evidence of mitral valve regurgitation.  4. The aortic valve is tricuspid. There is mild calcification of the aortic valve. Aortic valve regurgitation is not visualized. Aortic valve sclerosis is present, with no evidence of aortic valve stenosis. FINDINGS  Left Ventricle: Left ventricular ejection fraction, by estimation, is 55 to 60%. Left ventricular ejection fraction by 2D MOD biplane is 55.4 %. The left ventricle has normal function. The left ventricle has no regional wall motion abnormalities. The left ventricular internal cavity size was normal in size. There is no left ventricular hypertrophy. Left ventricular diastolic parameters are consistent with Grade I diastolic dysfunction (impaired relaxation). Right Ventricle: The right ventricular size is not well visualized. No increase in right ventricular wall thickness. Right ventricular systolic function is normal. Left Atrium: Left atrial size was not well visualized. Right Atrium: Right atrial size was not well visualized. Pericardium: There is no evidence of pericardial effusion. Mitral Valve: The mitral valve is degenerative in appearance. No evidence of mitral valve regurgitation. Tricuspid Valve: The tricuspid valve is normal in structure. Tricuspid valve regurgitation is not demonstrated. No evidence of tricuspid stenosis. Aortic Valve: The aortic valve is tricuspid. There is mild calcification of the aortic valve. There is mild aortic valve annular calcification. Aortic valve regurgitation is  not visualized. Aortic valve sclerosis is present, with no evidence of aortic valve stenosis. Pulmonic Valve: The pulmonic valve was normal in structure. Pulmonic valve regurgitation is not visualized. No evidence of pulmonic stenosis. Aorta: The aortic root and ascending aorta are structurally normal, with no evidence of dilitation. IAS/Shunts: The interatrial septum was not well visualized.  LEFT VENTRICLE PLAX 2D  Biplane EF (MOD) LVIDd:         5.60 cm         LV Biplane EF:   Left LVIDs:         4.40 cm                          ventricular LV PW:         0.90 cm                          ejection LV IVS:        0.90 cm                          fraction by LVOT diam:     2.00 cm                          2D MOD LVOT Area:     3.14 cm                         biplane is                                                 55.4 %.  LV Volumes (MOD)               Diastology LV vol d, MOD    69.3 ml       LV e' medial:    5.98 cm/s A2C:                           LV E/e' medial:  9.8 LV vol d, MOD    81.6 ml       LV e' lateral:   6.85 cm/s A4C:                           LV E/e' lateral: 8.5 LV vol s, MOD    31.1 ml A2C: LV vol s, MOD    36.2 ml A4C: LV SV MOD A2C:   38.2 ml LV SV MOD A4C:   81.6 ml LV SV MOD BP:    42.6 ml LEFT ATRIUM         Index LA diam:    3.50 cm 1.56 cm/m   AORTA Ao Root diam: 2.50 cm Ao Asc diam:  3.70 cm MITRAL VALVE               TRICUSPID VALVE MV Area (PHT): 3.21 cm    TR Peak grad:   24.6 mmHg MV Decel Time: 236 msec    TR Vmax:        248.00 cm/s MV E velocity: 58.50 cm/s MV A velocity: 54.70 cm/s  SHUNTS MV E/A ratio:  1.07        Systemic Diam: 2.00 cm Stanly Leavens MD Electronically signed by Stanly Leavens MD Signature Date/Time: 12/21/2023/11:04:01 AM    Final    DG Chest Port 1 View Result Date: 12/20/2023 CLINICAL DATA:  Shortness of breath EXAM: PORTABLE CHEST 1 VIEW COMPARISON:  11/28/2023 FINDINGS: Cardiomegaly, vascular congestion. Low lung  volumes. Small bilateral pleural effusions  again noted, unchanged. Patchy bilateral lower lobe airspace opacities. No acute bony abnormality. IMPRESSION: Cardiomegaly, vascular congestion. Low lung volumes with bilateral lower lobe airspace opacities which could reflect edema or infection. Small bilateral effusions. Electronically Signed   By: Franky Crease M.D.   On: 12/20/2023 21:15    Microbiology: Results for orders placed or performed during the hospital encounter of 12/31/23  Culture, blood (routine x 2) Call MD if unable to obtain prior to antibiotics being given     Status: None (Preliminary result)   Collection Time: 12/31/23  4:12 AM   Specimen: BLOOD LEFT ARM  Result Value Ref Range Status   Specimen Description   Final    BLOOD LEFT ARM Performed at [facility], [address] [place], [place] [address]    Special Requests   Final    BOTTLES DRAWN AEROBIC ONLY Blood Culture results may not be optimal due to an inadequate volume of blood received in culture bottles Performed at [facility], [address], [place], [place] [address]    Culture   Final    NO GROWTH 2 DAYS Performed at [facility], [address] [place], [place] [address]    Report Status PENDING  Incomplete  Culture, blood (routine x 2) Call MD if unable to obtain prior to antibiotics being given     Status: None (Preliminary result)   Collection Time: 12/31/23  4:17 AM   Specimen: BLOOD RIGHT ARM  Result Value Ref Range Status   Specimen Description   Final    BLOOD RIGHT ARM Performed at [facility], [address], [place], [place] [address]    Special Requests   Final    BOTTLES DRAWN AEROBIC ONLY Blood Culture results may not be optimal due to an inadequate volume of blood received in culture bottles Performed at [facility], [address], [place], [place] [address]    Culture   Final    NO GROWTH 2 DAYS Performed at [facility], [address] [place], [place] [address]    Report Status PENDING  Incomplete  Respiratory (~20 pathogens) panel by PCR     Status: None   Collection Time: 12/31/23  7:00 AM   Specimen: Nasopharyngeal Swab; Respiratory  Result Value Ref Range Status   Adenovirus NOT DETECTED NOT DETECTED Final   Coronavirus 229E NOT DETECTED NOT DETECTED Final    Comment: (NOTE) The Coronavirus on the Respiratory Panel, DOES NOT test for the novel  Coronavirus (2019 nCoV)    Coronavirus HKU1 NOT DETECTED NOT DETECTED Final   Coronavirus NL63 NOT DETECTED NOT DETECTED Final   Coronavirus OC43 NOT DETECTED NOT DETECTED Final   Metapneumovirus NOT DETECTED NOT DETECTED Final   Rhinovirus / Enterovirus NOT DETECTED NOT DETECTED Final   Influenza A NOT DETECTED NOT DETECTED Final   Influenza B NOT DETECTED NOT DETECTED Final   Parainfluenza Virus 1 NOT DETECTED NOT DETECTED Final   Parainfluenza Virus 2 NOT DETECTED NOT DETECTED Final   Parainfluenza Virus 3 NOT DETECTED NOT DETECTED Final   Parainfluenza Virus 4 NOT DETECTED NOT DETECTED Final   Respiratory Syncytial Virus NOT DETECTED NOT DETECTED Final   Bordetella pertussis NOT DETECTED NOT DETECTED Final   Bordetella Parapertussis NOT DETECTED NOT DETECTED Final   Chlamydophila pneumoniae NOT DETECTED NOT DETECTED Final   Mycoplasma pneumoniae NOT DETECTED NOT DETECTED Final    Comment: Performed at [facility]  Lab, [address], [place], [place] [address]   *Note: Due to a large number of results and/or encounters for the requested time period, some results have not been displayed. A complete set of results can be found in Results Review.    Labs: CBC: Recent Labs  Lab 12/31/23 0040 12/31/23 0416  WBC 9.7 9.2  HGB 14.0 14.4  HCT 43.2 45.1  MCV 92.9 91.7  PLT 349 321   Basic Metabolic Panel: Recent Labs  Lab 12/31/23 0416 01/01/24 0338  NA 133* 135  K 3.8 3.9  CL 97* 97*  CO2 23 28  GLUCOSE 173* 132*  BUN 21 22   CREATININE 1.17 1.14  CALCIUM 8.7* 9.1   Liver Function Tests: Recent Labs  Lab 12/31/23 0416  AST 24  ALT 22  ALKPHOS 78  BILITOT 0.6  PROT 8.3*  ALBUMIN  3.4*   CBG: No results for input(s): GLUCAP in the last 168 hours.  Discharge time spent: {LESS THAN/GREATER UYJW:73611} 30 minutes.  Signed: Elgin Lam, MD [facility] 01/02/2024

## 2024-01-02 NOTE — Discharge Instructions (Signed)
 Wayne Booth,  You were in the hospital with pneumonia and acute heart failure. You have improved with antibiotics, supportive care and lasix  IV. Please take your medications as prescribed and follow-up with your PCP and cardiologist. It appears your pulmonologist (lung doctor) has been trying to get you in to see him; please make sure you follow-up with him promptly.

## 2024-01-03 ENCOUNTER — Telehealth: Payer: Self-pay | Admitting: *Deleted

## 2024-01-03 ENCOUNTER — Telehealth: Payer: Self-pay

## 2024-01-03 NOTE — Telephone Encounter (Signed)
 Called and spoke with Sherrell and VO given.  Copied from CRM [id number]. Topic: General - Other >> Dec 30, 2023  4:32 PM [place] J wrote: Reason for CRM: Sherrell calling from [facility] wanting to get verbal orders from Dr. Katrinka for palliative care.  Callback: [phone]  *Voicemail box is secured and it is okay to leave verbal approval there*

## 2024-01-03 NOTE — Transitions of Care (Post Inpatient/ED Visit) (Signed)
   01/03/2024  Name: Wayne Booth MRN: [id number] DOB: [birth year]  Today's TOC FU Call Status: Today's TOC FU Call Status:: Unsuccessful Call (1st Attempt) Unsuccessful Call (1st Attempt) Date: 01/03/24  Attempted to reach the patient regarding the most recent Inpatient/ED visit.  Follow Up Plan: Additional outreach attempts will be made to reach the patient to complete the Transitions of Care (Post Inpatient/ED visit) call.   Cathlean Headland BSN RN [facility] Health Care Management Coordinator [email or web address] Direct Dial: [phone]  Fax: [phone] Website: [email or web address]

## 2024-01-03 NOTE — Transitions of Care (Post Inpatient/ED Visit) (Signed)
 01/03/2024  Name: Wayne Booth MRN: [id number] DOB: [birth year]  Today's TOC FU Call Status: Today's TOC FU Call Status:: Successful TOC FU Call Completed TOC FU Call Complete Date: 01/03/24 Patient's Name and Date of Birth confirmed.  Transition Care Management Follow-up Telephone Call Date of Discharge: 01/02/24 Discharge Facility: [facility] [place]) Type of Discharge: Inpatient Admission Primary Inpatient Discharge Diagnosis:: Acute on chronic diastolic CHF How have you been since you were released from the hospital?: Better Any questions or concerns?:  (Patient wanted to know if physical therapy was coming out. RN explained that he was referred for outpatient physical therpy and [facility])  Items Reviewed: Did you receive and understand the discharge instructions provided?: Yes Medications obtained,verified, and reconciled?: Yes (Medications Reviewed) Any new allergies since your discharge?: No Dietary orders reviewed?: No Do you have support at home?: Yes People in Home [RPT]: spouse Name of Support/Comfort Primary Source: Jodi wife  Medications Reviewed Today: Medications Reviewed Today     Reviewed by Kennieth Cathlean DEL, RN (Case Manager) on 01/03/24 at 1414  Med List Status: <None>   Medication Order Taking? Sig Documenting Provider Last Dose Status Informant  acetaminophen  (TYLENOL ) 325 MG tablet [id number] Yes Take 2 tablets (650 mg total) by mouth every 6 (six) hours as needed for mild pain (pain score 1-3) or fever (or Fever >/= 101). Arrien, Elidia Sieving, MD  Active Spouse/Significant Other, Pharmacy Records  albuterol  (PROVENTIL ) (2.5 MG/3ML) 0.083% nebulizer solution [id number] Yes Take 3 mLs (2.5 mg total) by nebulization every 6 (six) hours as needed for wheezing or shortness of breath. Parrett, Madelin RAMAN, NP  Active Spouse/Significant Other, Pharmacy Records  albuterol  (VENTOLIN  HFA) 108 (90 Base) MCG/ACT inhaler  [id number] Yes INHALE 2 PUFFS BY MOUTH EVERY 6 HOURS AS NEEDED FOR WHEEZING OR SHORTNESS OF BREATH Parrett, Tammy S, NP  Active Spouse/Significant Other, Pharmacy Records           Med Note MARISA, KATHY N   Tue Oct 19, 2023  6:04 PM)    budesonide  (PULMICORT ) 0.25 MG/2ML nebulizer solution [id number] Yes Take 2 mLs (0.25 mg total) by nebulization 2 (two) times daily. Jillian Buttery, MD  Active Spouse/Significant Other, Pharmacy Records  busPIRone  (BUSPAR ) 5 MG tablet [id number] Yes Take 5 mg by mouth at bedtime as needed (anxiety). [provider]  Active Spouse/Significant Other, Pharmacy Records           Med Note (CRUTHIS, CHLOE C   Tue Dec 21, 2023  2:49 PM)    doxycycline  (VIBRA -TABS) 100 MG tablet [id number] Yes Take 1 tablet (100 mg total) by mouth 2 (two) times daily for 2 days. Briana Elgin LABOR, MD  Active   empagliflozin  (JARDIANCE ) 10 MG TABS tablet [id number] Yes Take 1 tablet (10 mg total) by mouth daily. Arrien, Elidia Sieving, MD  Active Spouse/Significant Other, Pharmacy Records  escitalopram  (LEXAPRO ) 20 MG tablet [id number] Yes TAKE 1 TABLET BY MOUTH DAILY Katrinka Garnette KIDD, MD  Active Spouse/Significant Other, Pharmacy Records  furosemide  (LASIX ) 40 MG tablet [id number] Yes Take 1 tablet (40 mg total) by mouth daily. Arrien, Elidia Sieving, MD  Active Spouse/Significant Other, Pharmacy Records  gabapentin  (NEURONTIN ) 300 MG capsule [id number] Yes Take 1 capsule (300 mg total) by mouth 3 (three) times daily. Katrinka Garnette KIDD, MD  Active Spouse/Significant Other, Pharmacy Records           Med Note (CRUTHIS, SHEFFIELD BROCKS   Tue Dec 21, 2023  2:51 PM)    ipratropium-albuterol  (DUONEB) 0.5-2.5 (3) MG/3ML SOLN [id number] Yes Take 3 mLs by nebulization every 6 (six) hours as needed. Jillian Buttery, MD  Active Spouse/Significant Other, Pharmacy Records  ondansetron  (ZOFRAN ) 4 MG tablet [id number] Yes Take 1 tablet (4 mg total) by mouth every 6 (six) hours as needed for nausea. Will Almarie MATSU, MD  Active Spouse/Significant Other, Pharmacy Records  OXYGEN  [id number] Yes Inhale 5 L into the lungs continuous. [provider]  Active Spouse/Significant Other, Pharmacy Records  spironolactone  (ALDACTONE ) 25 MG tablet [id number] Yes Take 1 tablet (25 mg total) by mouth daily. Arrien, Elidia Sieving, MD  Active Spouse/Significant Other, Pharmacy Records  traMADol  (ULTRAM ) 50 MG tablet [id number] Yes Take 1 tablet (50 mg total) by mouth every 6 (six) hours as needed for up to 3 days for severe pain (pain score 7-10). Briana Elgin LABOR, MD  Active   traZODone  (DESYREL ) 50 MG tablet [id number] Yes Take 1 tablet (50 mg total) by mouth at bedtime. Katrinka Garnette KIDD, MD  Active Spouse/Significant Other, Pharmacy Records            Home Care and Equipment/Supplies: Were Home Health Services Ordered?: Yes [facility] of [facility] Health [facility]  [place], Has Agency set up a time to come to your home?: No EMR reviewed for Home Health Orders: Orders present/patient has not received call (refer to CM for follow-up) Any new equipment or medical supplies ordered?: NA  Functional Questionnaire: Do you need assistance with bathing/showering or dressing?: No Do you need assistance with meal preparation?: Yes Do you need assistance with eating?: No Do you have difficulty maintaining continence: No Do you need assistance with getting out of bed/getting out of a chair/moving?: No Do you have difficulty managing or taking your medications?: No  Follow up appointments reviewed: PCP Follow-up appointment confirmed?: Yes Date of PCP follow-up appointment?: 01/11/24 Follow-up Provider: Dr Garnette Katrinka Specialist [facility] Follow-up appointment confirmed?: NA Do you need transportation to your follow-up appointment?: No Do you understand care options if your condition(s) worsen?: Yes-patient verbalized understanding  SDOH Interventions Today    Flowsheet Row  Most Recent Value  SDOH Interventions   Food Insecurity Interventions Intervention Not Indicated  Housing Interventions Intervention Not Indicated  Transportation Interventions Intervention Not Indicated, Patient Resources (Friends/Family)  Utilities Interventions Intervention Not Indicated  Depression Interventions/Treatment  Counseling, Currently on Treatment   Patient understands that he was referred for outpatient physical therapy. Patient stated if they can't come to the house he doesn't want to go.   Cathlean Headland BSN RN [facility] Health Care Management Coordinator [email or web address] Direct Dial: [phone]  Fax: [phone] Website: [email or web address]

## 2024-01-04 ENCOUNTER — Telehealth: Payer: Self-pay | Admitting: Family Medicine

## 2024-01-04 NOTE — Telephone Encounter (Signed)
 Received faxed  document Home Health Certificate (Order ID [id number]), to be filled out by provider. Patient requested to send it back via Fax . Document is located in providers tray at front office.Please advise

## 2024-01-05 DIAGNOSIS — N182 Chronic kidney disease, stage 2 (mild): Secondary | ICD-10-CM | POA: Diagnosis not present

## 2024-01-05 DIAGNOSIS — I5043 Acute on chronic combined systolic (congestive) and diastolic (congestive) heart failure: Secondary | ICD-10-CM | POA: Diagnosis not present

## 2024-01-05 DIAGNOSIS — J440 Chronic obstructive pulmonary disease with acute lower respiratory infection: Secondary | ICD-10-CM | POA: Diagnosis not present

## 2024-01-05 DIAGNOSIS — J449 Chronic obstructive pulmonary disease, unspecified: Secondary | ICD-10-CM | POA: Diagnosis not present

## 2024-01-05 DIAGNOSIS — J849 Interstitial pulmonary disease, unspecified: Secondary | ICD-10-CM | POA: Diagnosis not present

## 2024-01-05 DIAGNOSIS — J9611 Chronic respiratory failure with hypoxia: Secondary | ICD-10-CM | POA: Diagnosis not present

## 2024-01-05 DIAGNOSIS — I4891 Unspecified atrial fibrillation: Secondary | ICD-10-CM | POA: Diagnosis not present

## 2024-01-05 DIAGNOSIS — I130 Hypertensive heart and chronic kidney disease with heart failure and stage 1 through stage 4 chronic kidney disease, or unspecified chronic kidney disease: Secondary | ICD-10-CM | POA: Diagnosis not present

## 2024-01-05 LAB — CULTURE, BLOOD (ROUTINE X 2)
Culture: NO GROWTH
Culture: NO GROWTH

## 2024-01-05 NOTE — Telephone Encounter (Signed)
 Form completed and faxed back

## 2024-01-07 DIAGNOSIS — J440 Chronic obstructive pulmonary disease with acute lower respiratory infection: Secondary | ICD-10-CM | POA: Diagnosis not present

## 2024-01-07 DIAGNOSIS — J449 Chronic obstructive pulmonary disease, unspecified: Secondary | ICD-10-CM | POA: Diagnosis not present

## 2024-01-07 DIAGNOSIS — I4891 Unspecified atrial fibrillation: Secondary | ICD-10-CM | POA: Diagnosis not present

## 2024-01-07 DIAGNOSIS — I130 Hypertensive heart and chronic kidney disease with heart failure and stage 1 through stage 4 chronic kidney disease, or unspecified chronic kidney disease: Secondary | ICD-10-CM | POA: Diagnosis not present

## 2024-01-07 DIAGNOSIS — N182 Chronic kidney disease, stage 2 (mild): Secondary | ICD-10-CM | POA: Diagnosis not present

## 2024-01-07 DIAGNOSIS — J849 Interstitial pulmonary disease, unspecified: Secondary | ICD-10-CM | POA: Diagnosis not present

## 2024-01-07 DIAGNOSIS — I5043 Acute on chronic combined systolic (congestive) and diastolic (congestive) heart failure: Secondary | ICD-10-CM | POA: Diagnosis not present

## 2024-01-07 DIAGNOSIS — J9611 Chronic respiratory failure with hypoxia: Secondary | ICD-10-CM | POA: Diagnosis not present

## 2024-01-10 ENCOUNTER — Other Ambulatory Visit: Payer: Self-pay | Admitting: Family Medicine

## 2024-01-10 DIAGNOSIS — J849 Interstitial pulmonary disease, unspecified: Secondary | ICD-10-CM | POA: Diagnosis not present

## 2024-01-10 DIAGNOSIS — I5043 Acute on chronic combined systolic (congestive) and diastolic (congestive) heart failure: Secondary | ICD-10-CM | POA: Diagnosis not present

## 2024-01-10 DIAGNOSIS — I130 Hypertensive heart and chronic kidney disease with heart failure and stage 1 through stage 4 chronic kidney disease, or unspecified chronic kidney disease: Secondary | ICD-10-CM | POA: Diagnosis not present

## 2024-01-10 DIAGNOSIS — J440 Chronic obstructive pulmonary disease with acute lower respiratory infection: Secondary | ICD-10-CM | POA: Diagnosis not present

## 2024-01-10 DIAGNOSIS — N182 Chronic kidney disease, stage 2 (mild): Secondary | ICD-10-CM | POA: Diagnosis not present

## 2024-01-10 DIAGNOSIS — J449 Chronic obstructive pulmonary disease, unspecified: Secondary | ICD-10-CM | POA: Diagnosis not present

## 2024-01-10 DIAGNOSIS — J9611 Chronic respiratory failure with hypoxia: Secondary | ICD-10-CM | POA: Diagnosis not present

## 2024-01-10 DIAGNOSIS — I4891 Unspecified atrial fibrillation: Secondary | ICD-10-CM | POA: Diagnosis not present

## 2024-01-10 NOTE — Telephone Encounter (Signed)
 Diagnosis verification successful, grant coverage is now listed as active.

## 2024-01-11 ENCOUNTER — Ambulatory Visit: Payer: Medicare Other

## 2024-01-11 DIAGNOSIS — N182 Chronic kidney disease, stage 2 (mild): Secondary | ICD-10-CM | POA: Diagnosis not present

## 2024-01-11 DIAGNOSIS — J9611 Chronic respiratory failure with hypoxia: Secondary | ICD-10-CM | POA: Diagnosis not present

## 2024-01-11 DIAGNOSIS — I130 Hypertensive heart and chronic kidney disease with heart failure and stage 1 through stage 4 chronic kidney disease, or unspecified chronic kidney disease: Secondary | ICD-10-CM | POA: Diagnosis not present

## 2024-01-11 DIAGNOSIS — J440 Chronic obstructive pulmonary disease with acute lower respiratory infection: Secondary | ICD-10-CM | POA: Diagnosis not present

## 2024-01-11 DIAGNOSIS — I5043 Acute on chronic combined systolic (congestive) and diastolic (congestive) heart failure: Secondary | ICD-10-CM | POA: Diagnosis not present

## 2024-01-11 DIAGNOSIS — J849 Interstitial pulmonary disease, unspecified: Secondary | ICD-10-CM | POA: Diagnosis not present

## 2024-01-11 DIAGNOSIS — J449 Chronic obstructive pulmonary disease, unspecified: Secondary | ICD-10-CM | POA: Diagnosis not present

## 2024-01-11 DIAGNOSIS — I4891 Unspecified atrial fibrillation: Secondary | ICD-10-CM | POA: Diagnosis not present

## 2024-01-12 ENCOUNTER — Inpatient Hospital Stay: Admitting: Family Medicine

## 2024-01-13 DIAGNOSIS — I4891 Unspecified atrial fibrillation: Secondary | ICD-10-CM | POA: Diagnosis not present

## 2024-01-13 DIAGNOSIS — J449 Chronic obstructive pulmonary disease, unspecified: Secondary | ICD-10-CM | POA: Diagnosis not present

## 2024-01-13 DIAGNOSIS — J440 Chronic obstructive pulmonary disease with acute lower respiratory infection: Secondary | ICD-10-CM | POA: Diagnosis not present

## 2024-01-13 DIAGNOSIS — J9611 Chronic respiratory failure with hypoxia: Secondary | ICD-10-CM | POA: Diagnosis not present

## 2024-01-13 DIAGNOSIS — J849 Interstitial pulmonary disease, unspecified: Secondary | ICD-10-CM | POA: Diagnosis not present

## 2024-01-13 DIAGNOSIS — I5043 Acute on chronic combined systolic (congestive) and diastolic (congestive) heart failure: Secondary | ICD-10-CM | POA: Diagnosis not present

## 2024-01-13 DIAGNOSIS — I130 Hypertensive heart and chronic kidney disease with heart failure and stage 1 through stage 4 chronic kidney disease, or unspecified chronic kidney disease: Secondary | ICD-10-CM | POA: Diagnosis not present

## 2024-01-13 DIAGNOSIS — N182 Chronic kidney disease, stage 2 (mild): Secondary | ICD-10-CM | POA: Diagnosis not present

## 2024-01-14 DIAGNOSIS — N182 Chronic kidney disease, stage 2 (mild): Secondary | ICD-10-CM | POA: Diagnosis not present

## 2024-01-14 DIAGNOSIS — I130 Hypertensive heart and chronic kidney disease with heart failure and stage 1 through stage 4 chronic kidney disease, or unspecified chronic kidney disease: Secondary | ICD-10-CM | POA: Diagnosis not present

## 2024-01-14 DIAGNOSIS — J9611 Chronic respiratory failure with hypoxia: Secondary | ICD-10-CM | POA: Diagnosis not present

## 2024-01-14 DIAGNOSIS — J440 Chronic obstructive pulmonary disease with acute lower respiratory infection: Secondary | ICD-10-CM | POA: Diagnosis not present

## 2024-01-14 DIAGNOSIS — J849 Interstitial pulmonary disease, unspecified: Secondary | ICD-10-CM | POA: Diagnosis not present

## 2024-01-14 DIAGNOSIS — J449 Chronic obstructive pulmonary disease, unspecified: Secondary | ICD-10-CM | POA: Diagnosis not present

## 2024-01-14 DIAGNOSIS — I4891 Unspecified atrial fibrillation: Secondary | ICD-10-CM | POA: Diagnosis not present

## 2024-01-14 DIAGNOSIS — I5043 Acute on chronic combined systolic (congestive) and diastolic (congestive) heart failure: Secondary | ICD-10-CM | POA: Diagnosis not present

## 2024-01-17 ENCOUNTER — Ambulatory Visit (INDEPENDENT_AMBULATORY_CARE_PROVIDER_SITE_OTHER)

## 2024-01-17 VITALS — Ht 71.0 in | Wt 229.0 lb

## 2024-01-17 DIAGNOSIS — Z0000 Encounter for general adult medical examination without abnormal findings: Secondary | ICD-10-CM

## 2024-01-17 NOTE — Progress Notes (Addendum)
 Subjective:   Wayne Booth is a 83 y.o. who presents for a Medicare Wellness preventive visit.  As a reminder, Annual Wellness Visits don't include a physical exam, and some assessments may be limited, especially if this visit is performed virtually. We may recommend an in-person follow-up visit with your provider if needed.  Visit Complete: Virtual I connected with  Wayne Booth on 01/17/24 by a audio enabled telemedicine application and verified that I am speaking with the correct person using two identifiers.  Patient Location: Home  Provider Location: Office/Clinic  I discussed the limitations of evaluation and management by telemedicine. The patient expressed understanding and agreed to proceed.  Vital Signs: Because this visit was a virtual/telehealth visit, some criteria may be missing or patient reported. Any vitals not documented were not able to be obtained and vitals that have been documented are patient reported.  VideoDeclined- This patient declined Librarian, academic. Therefore the visit was completed with audio only.  Persons Participating in Visit: Patient.  AWV Questionnaire: No: Patient Medicare AWV questionnaire was not completed prior to this visit.  Cardiac Risk Factors include: advanced age (>70men, >86 women);dyslipidemia;male gender;obesity (BMI >30kg/m2)     Objective:    Today's Vitals   01/17/24 1417  Weight: 229 lb (103.9 kg)  Height: 5' 11 (1.803 m)   Body mass index is 31.94 kg/m.     01/17/2024    2:20 PM 12/31/2023   12:29 AM 12/21/2023    5:34 PM 12/21/2023    5:17 PM 11/28/2023    3:59 PM 10/19/2023   11:00 PM 10/19/2023    3:19 PM  Advanced Directives  Does Patient Have a Medical Advance Directive? Yes No  No No No Yes  Type of Estate agent of [place];Living will        Does patient want to make changes to medical advance directive? No - Patient declined     No - Patient declined    Copy of Healthcare Power of Attorney in Chart? Yes - validated most recent copy scanned in chart (See row information)        Would patient like information on creating a medical advance directive? No - Patient declined No - Patient declined No - Patient declined  No - Patient declined No - Patient declined     Current Medications (verified) Outpatient Encounter Medications as of 01/17/2024  Medication Sig   acetaminophen  (TYLENOL ) 325 MG tablet Take 2 tablets (650 mg total) by mouth every 6 (six) hours as needed for mild pain (pain score 1-3) or fever (or Fever >/= 101).   albuterol  (PROVENTIL ) (2.5 MG/3ML) 0.083% nebulizer solution Take 3 mLs (2.5 mg total) by nebulization every 6 (six) hours as needed for wheezing or shortness of breath.   albuterol  (VENTOLIN  HFA) 108 (90 Base) MCG/ACT inhaler INHALE 2 PUFFS BY MOUTH EVERY 6 HOURS AS NEEDED FOR WHEEZING OR SHORTNESS OF BREATH   budesonide  (PULMICORT ) 0.25 MG/2ML nebulizer solution Take 2 mLs (0.25 mg total) by nebulization 2 (two) times daily.   busPIRone  (BUSPAR ) 5 MG tablet Take 5 mg by mouth at bedtime as needed (anxiety).   empagliflozin  (JARDIANCE ) 10 MG TABS tablet Take 1 tablet (10 mg total) by mouth daily.   escitalopram  (LEXAPRO ) 20 MG tablet TAKE 1 TABLET BY MOUTH DAILY   furosemide  (LASIX ) 40 MG tablet Take 1 tablet (40 mg total) by mouth daily.   gabapentin  (NEURONTIN ) 300 MG capsule TAKE 1 CAPSULE BY MOUTH 3  TIMES A DAY   ipratropium-albuterol  (DUONEB) 0.5-2.5 (3) MG/3ML SOLN Take 3 mLs by nebulization every 6 (six) hours as needed.   ondansetron  (ZOFRAN ) 4 MG tablet Take 1 tablet (4 mg total) by mouth every 6 (six) hours as needed for nausea.   OXYGEN  Inhale 5 L into the lungs continuous.   spironolactone  (ALDACTONE ) 25 MG tablet Take 1 tablet (25 mg total) by mouth daily.   traZODone  (DESYREL ) 50 MG tablet Take 1 tablet (50 mg total) by mouth at bedtime.   No facility-administered encounter medications on file as of  01/17/2024.    Allergies (verified) Ambien  [zolpidem ]   History: Past Medical History:  Diagnosis Date   Allergy    States his nose runs when he is around grease.    Anxiety    Arthritis    Atrial fibrillation (HCC)    Cancer (HCC)    Coronary artery disease    History of total knee replacement    Hypertension    Hypothyroidism    Kidney cancer, primary, with metastasis from kidney to other site [facility])    Kidney stone    OSA (obstructive sleep apnea)    pt does not wear cpap at night   Pituitary cyst (HCC)    Pneumonia    hx of   Prostate cancer [facility])    Past Surgical History:  Procedure Laterality Date   ABLATION OF DYSRHYTHMIC FOCUS  01/28/2018   ANTERIOR CERVICAL DECOMP/DISCECTOMY FUSION N/A 08/29/2012   Procedure: ANTERIOR CERVICAL DECOMPRESSION/DISCECTOMY FUSION 1 LEVEL;  Surgeon: Alm GORMAN Molt, MD;  Location: [facility] NEURO ORS;  Service: Neurosurgery;  Laterality: N/A;   APPENDECTOMY     ATRIAL FIBRILLATION ABLATION N/A 01/28/2018   Procedure: ATRIAL FIBRILLATION ABLATION;  Surgeon: Inocencio Soyla Lunger, MD;  Location: [facility] INVASIVE CV LAB;  Service: Cardiovascular;  Laterality: N/A;   CARDIAC CATHETERIZATION  2008   clean   CHOLECYSTECTOMY     COLONOSCOPY W/ POLYPECTOMY     CRANIOTOMY N/A 11/08/2012   Procedure: CRANIOTOMY HYPOPHYSECTOMY TRANSNASAL APPROACH;  Surgeon: Darina MALVA Boehringer, MD;  Location: [facility] NEURO ORS;  Service: Neurosurgery;  Laterality: N/A;  Transphenoidal Resection of Pituitary Tumor   FINGER SURGERY Left 2017   Dr Camella   INSERTION PROSTATE RADIATION SEED     IR THORACENTESIS ASP PLEURAL SPACE W/IMG GUIDE  04/15/2018   KIDNEY STONE SURGERY     KNEE ARTHROSCOPY  2007   left   NEPHRECTOMY Left    POSTERIOR CERVICAL LAMINECTOMY Left 04/24/2015   Procedure: Foraminotomy cervical five - cervical six cervical six - cervical seven left;  Surgeon: Alm GORMAN Molt, MD;  Location: [facility] NEURO ORS;  Service: Neurosurgery;  Laterality: Left;   REPLACEMENT TOTAL KNEE  BILATERAL     RIGHT/LEFT HEART CATH AND CORONARY ANGIOGRAPHY N/A 07/14/2019   Procedure: RIGHT/LEFT HEART CATH AND CORONARY ANGIOGRAPHY;  Surgeon: Dann Candyce GORMAN, MD;  Location: [facility] INVASIVE CV LAB;  Service: Cardiovascular;  Laterality: N/A;   Family History  Problem Relation Age of Onset   Cancer Mother        stomach, died when he was young   Cancer Father        ?mouth, died before patient born   Kidney cancer Sister    Colon cancer Brother 52   Gastric cancer Son 59       stomach cancer per pt   Esophageal cancer Neg Hx    Rectal cancer Neg Hx    Stomach cancer Neg Hx  Social History   Socioeconomic History   Marital status: Married    Spouse name: Elijah   Number of children: 2   Years of education: Not on file   Highest education level: High school graduate  Occupational History   Occupation: retired Engineer, site: RETIRED  Tobacco Use   Smoking status: Never   Smokeless tobacco: Never  Vaping Use   Vaping status: Never Used  Substance and Sexual Activity   Alcohol  use: Yes    Alcohol /week: 2.0 standard drinks of alcohol     Types: 2 Shots of liquor per week    Comment: socially   Drug use: No   Sexual activity: Yes  Other Topics Concern   Not on file  Social History Narrative   Married 1965 (wife patient of Dr. Elaine). 2 sons. 4 granddaughters.       Retired Investment banker, corporate.       Hobbies: golf, fishing, formerly hunting.    Social Drivers of Corporate investment banker Strain: Low Risk  (01/17/2024)   Overall Financial Resource Strain (CARDIA)    Difficulty of Paying Living Expenses: Not hard at all  Food Insecurity: No Food Insecurity (01/17/2024)   Hunger Vital Sign    Worried About Running Out of Food in the Last Year: Never true    Ran Out of Food in the Last Year: Never true  Transportation Needs: No Transportation Needs (01/17/2024)   PRAPARE - Administrator, Civil Service (Medical): No     Lack of Transportation (Non-Medical): No  Physical Activity: Sufficiently Active (01/17/2024)   Exercise Vital Sign    Days of Exercise per Week: 3 days    Minutes of Exercise per Session: 50 min  Stress: No Stress Concern Present (01/17/2024)   [employer] of Occupational Health - Occupational Stress Questionnaire    Feeling of Stress: Not at all  Social Connections: Moderately Integrated (01/17/2024)   Social Connection and Isolation Panel    Frequency of Communication with Friends and Family: More than three times a week    Frequency of Social Gatherings with Friends and Family: Three times a week    Attends Religious Services: 1 to 4 times per year    Active Member of Clubs or Organizations: No    Attends Banker Meetings: Never    Marital Status: Married    Tobacco Counseling Counseling given: Not Answered    Clinical Intake:  Pre-visit preparation completed: Yes  Pain : No/denies pain     BMI - recorded: 31.94 Nutritional Status: BMI > 30  Obese Nutritional Risks: None Diabetes: No  Lab Results  Component Value Date   HGBA1C 5.7 04/13/2022   HGBA1C 5.6 10/01/2021   HGBA1C 5.6 11/05/2020     How often do you need to have someone help you when you read instructions, pamphlets, or other written materials from your doctor or pharmacy?: 1 - Never  Interpreter Needed?: No  Information entered by :: Wayne Haws, LPN   Activities of Daily Living     01/17/2024    2:19 PM 12/31/2023    4:00 AM  In your present state of health, do you have any difficulty performing the following activities:  Hearing? 0 0  Vision? 0 0  Difficulty concentrating or making decisions? 0 0  Walking or climbing stairs? 0   Dressing or bathing? 0   Doing errands, shopping? 0 1  Preparing Food and eating ? N  Using the Toilet? N   In the past six months, have you accidently leaked urine? N   Do you have problems with loss of bowel control? N   Managing your  Medications? N   Managing your Finances? N   Housekeeping or managing your Housekeeping? N     Patient Care Team: Katrinka Garnette KIDD, MD as PCP - General (Family Medicine) Dann Candyce RAMAN, MD as PCP - Cardiology (Cardiology) Renda Glance, MD as Surgeon (Urology) Jude Harden GAILS, MD as Consulting Physician (Pulmonary Disease) Gershon Donnice SAUNDERS, DPM as Consulting Physician (Podiatry) Alvia Olam BIRCH, RN as [facility] Care Management  I have updated your Care Teams any recent Medical Services you may have received from other providers in the past year.     Assessment:   This is a routine wellness examination for Wayne Booth.  Hearing/Vision screen Hearing Screening - Comments:: Pt denies any hearing issues  Vision Screening - Comments:: Encouraged to follow up with eye providers    Goals Addressed             This Visit's Progress    Patient Stated       Maintain health and activity        Depression Screen     01/17/2024    2:21 PM 01/03/2024    2:21 PM 02/11/2023    4:42 PM 01/04/2023    1:38 PM 01/30/2022   12:57 PM 10/01/2021   10:03 AM 11/05/2020   10:19 AM  PHQ 2/9 Scores  PHQ - 2 Score 0 6 6 0 0 0 0  PHQ- 9 Score 0 18 21  0 0 6    Fall Risk     01/17/2024    2:23 PM 01/03/2024    2:20 PM 01/04/2023    1:46 PM 01/30/2022   12:53 PM 11/05/2020   10:19 AM  Fall Risk   Falls in the past year? 0 0 0 0 0  Number falls in past yr: 0 0 0 0 0  Injury with Fall? 0 0 0 0 0  Risk for fall due to : No Fall Risks  Impaired balance/gait;Impaired mobility;Impaired vision No Fall Risks   Follow up Falls prevention discussed  Falls prevention discussed      MEDICARE RISK AT HOME:  Medicare Risk at Home Any stairs in or around the home?: Yes If so, are there any without handrails?: No Home free of loose throw rugs in walkways, pet beds, electrical cords, etc?: Yes Adequate lighting in your home to reduce risk of falls?: Yes Life alert?: No Use of a cane, walker  or w/c?: No Grab bars in the bathroom?: Yes Shower chair or bench in shower?: Yes Elevated toilet seat or a handicapped toilet?: No  TIMED UP AND GO:  Was the test performed?  No  Cognitive Function: 6CIT completed        01/17/2024    2:24 PM 01/04/2023    1:43 PM 04/14/2019    9:50 AM  6CIT Screen  What Year? 0 points 0 points 0 points  What month? 0 points 0 points 0 points  What time? 0 points 0 points 0 points  Count back from 20 0 points 0 points 0 points  Months in reverse 4 points 4 points 0 points  Repeat phrase 2 points 4 points 2 points  Total Score 6 points 8 points 2 points    Immunizations Immunization History  Administered Date(s) Administered   Fluad Quad(high Dose 65+)  03/16/2019, 05/14/2020, 06/10/2021, 04/13/2022   Fluad Trivalent(High Dose 65+) 08/11/2023   Influenza Split 04/01/2011, 03/29/2012   Influenza Whole 04/12/2007, 03/18/2009, 03/04/2010   Influenza, High Dose Seasonal PF 04/03/2014, 04/04/2015, 04/24/2016, 04/12/2017, 04/26/2018   Influenza,inj,Quad PF,6+ Mos 03/22/2013   Influenza-Unspecified 05/29/2017   PFIZER(Purple Top)SARS-COV-2 Vaccination 08/19/2019, 09/11/2019, 06/09/2020   PNEUMOCOCCAL CONJUGATE-20 12/26/2020   Pneumococcal Conjugate-13 11/12/2014   Pneumococcal Polysaccharide-23 06/09/2011   Pneumococcal-Unspecified 06/30/2015   Td 06/29/1994   Tdap 06/09/2011   Zoster Recombinant(Shingrix ) 03/16/2019, 07/19/2019   Zoster, Live 09/22/2007    Screening Tests Health Maintenance  Topic Date Due   COVID-19 Vaccine (4 - 2024-25 season) 02/28/2023   INFLUENZA VACCINE  01/28/2024   Pneumococcal Vaccine: 50+ Years  Completed   Zoster Vaccines- Shingrix   Completed   Hepatitis B Vaccines  Aged Out   HPV VACCINES  Aged Out   Meningococcal B Vaccine  Aged Out   DTaP/Tdap/Td  Discontinued   Colonoscopy  Discontinued   Hepatitis C Screening  Discontinued    Health Maintenance  Health Maintenance Due  Topic Date Due   COVID-19  Vaccine (4 - 2024-25 season) 02/28/2023   Health Maintenance Items Addressed: See Nurse Notes at the end of this note  Additional Screening:  Vision Screening: Recommended annual ophthalmology exams for early detection of glaucoma and other disorders of the eye. Would you like a referral to an eye doctor? No    Dental Screening: Recommended annual dental exams for proper oral hygiene  Community Resource Referral / Chronic Care Management: CRR required this visit?  No   CCM required this visit?  No   Plan:    I have personally reviewed and noted the following in the patient's chart:   Medical and social history Use of alcohol , tobacco or illicit drugs  Current medications and supplements including opioid prescriptions. Patient is not currently taking opioid prescriptions. Functional ability and status Nutritional status Physical activity Advanced directives List of other physicians Hospitalizations, surgeries, and ER visits in previous 12 months Vitals Screenings to include cognitive, depression, and falls Referrals and appointments  In addition, I have reviewed and discussed with patient certain preventive protocols, quality metrics, and best practice recommendations. A written personalized care plan for preventive services as well as general preventive health recommendations were provided to patient.   Wayne VEAR Haws, LPN   2/78/7974   After Visit Summary: (MyChart) Due to this being a telephonic visit, the after visit summary with patients personalized plan was offered to patient via MyChart   Notes: Nothing significant to report at this time.

## 2024-01-17 NOTE — Patient Instructions (Signed)
 Wayne Booth , Thank you for taking time out of your busy schedule to complete your Annual Wellness Visit with me. I enjoyed our conversation and look forward to speaking with you again next year. I, as well as your care team,  appreciate your ongoing commitment to your health goals. Please review the following plan we discussed and let me know if I can assist you in the future. Your Game plan/ To Do List    Referrals: If you haven't heard from the office you've been referred to, please reach out to them at the phone provided.   Follow up Visits: Next Medicare AWV with our clinical staff: 01/17/25   Have you seen your provider in the last 6 months (3 months if uncontrolled diabetes)? No Next Office Visit with your provider: Pt will call to schedule appt   Clinician Recommendations:  Each day, aim for 6 glasses of water , plenty of protein in your diet and try to get up and walk/ stretch every hour for 5-10 minutes at a time.         This is a list of the screening recommended for you and due dates:  Health Maintenance  Topic Date Due   Colon Cancer Screening  05/27/2020   COVID-19 Vaccine (4 - 2024-25 season) 02/28/2023   Flu Shot  01/28/2024   Pneumococcal Vaccine for age over 42  Completed   Zoster (Shingles) Vaccine  Completed   Hepatitis B Vaccine  Aged Out   HPV Vaccine  Aged Out   Meningitis B Vaccine  Aged Out   DTaP/Tdap/Td vaccine  Discontinued   Hepatitis C Screening  Discontinued    Advanced directives: (In Chart) A copy of your advanced directives are scanned into your chart should your provider ever need it. Advance Care Planning is important because it:  [x]  Makes sure you receive the medical care that is consistent with your values, goals, and preferences  [x]  It provides guidance to your family and loved ones and reduces their decisional burden about whether or not they are making the right decisions based on your wishes.  Follow the link provided in your after visit  summary or read over the paperwork we have mailed to you to help you started getting your Advance Directives in place. If you need assistance in completing these, please reach out to us  so that we can help you!  See attachments for Preventive Care and Fall Prevention Tips.

## 2024-01-18 DIAGNOSIS — J9611 Chronic respiratory failure with hypoxia: Secondary | ICD-10-CM | POA: Diagnosis not present

## 2024-01-18 DIAGNOSIS — I130 Hypertensive heart and chronic kidney disease with heart failure and stage 1 through stage 4 chronic kidney disease, or unspecified chronic kidney disease: Secondary | ICD-10-CM | POA: Diagnosis not present

## 2024-01-18 DIAGNOSIS — J449 Chronic obstructive pulmonary disease, unspecified: Secondary | ICD-10-CM | POA: Diagnosis not present

## 2024-01-18 DIAGNOSIS — I4891 Unspecified atrial fibrillation: Secondary | ICD-10-CM | POA: Diagnosis not present

## 2024-01-18 DIAGNOSIS — I5043 Acute on chronic combined systolic (congestive) and diastolic (congestive) heart failure: Secondary | ICD-10-CM | POA: Diagnosis not present

## 2024-01-18 DIAGNOSIS — J849 Interstitial pulmonary disease, unspecified: Secondary | ICD-10-CM | POA: Diagnosis not present

## 2024-01-18 DIAGNOSIS — N182 Chronic kidney disease, stage 2 (mild): Secondary | ICD-10-CM | POA: Diagnosis not present

## 2024-01-18 DIAGNOSIS — J440 Chronic obstructive pulmonary disease with acute lower respiratory infection: Secondary | ICD-10-CM | POA: Diagnosis not present

## 2024-01-20 DIAGNOSIS — I130 Hypertensive heart and chronic kidney disease with heart failure and stage 1 through stage 4 chronic kidney disease, or unspecified chronic kidney disease: Secondary | ICD-10-CM | POA: Diagnosis not present

## 2024-01-20 DIAGNOSIS — J9611 Chronic respiratory failure with hypoxia: Secondary | ICD-10-CM | POA: Diagnosis not present

## 2024-01-20 DIAGNOSIS — I5043 Acute on chronic combined systolic (congestive) and diastolic (congestive) heart failure: Secondary | ICD-10-CM | POA: Diagnosis not present

## 2024-01-20 DIAGNOSIS — I4891 Unspecified atrial fibrillation: Secondary | ICD-10-CM | POA: Diagnosis not present

## 2024-01-20 DIAGNOSIS — J849 Interstitial pulmonary disease, unspecified: Secondary | ICD-10-CM | POA: Diagnosis not present

## 2024-01-20 DIAGNOSIS — J440 Chronic obstructive pulmonary disease with acute lower respiratory infection: Secondary | ICD-10-CM | POA: Diagnosis not present

## 2024-01-20 DIAGNOSIS — N182 Chronic kidney disease, stage 2 (mild): Secondary | ICD-10-CM | POA: Diagnosis not present

## 2024-01-20 DIAGNOSIS — J449 Chronic obstructive pulmonary disease, unspecified: Secondary | ICD-10-CM | POA: Diagnosis not present

## 2024-01-21 ENCOUNTER — Telehealth: Payer: Self-pay | Admitting: Family Medicine

## 2024-01-21 DIAGNOSIS — J849 Interstitial pulmonary disease, unspecified: Secondary | ICD-10-CM | POA: Diagnosis not present

## 2024-01-21 DIAGNOSIS — I5043 Acute on chronic combined systolic (congestive) and diastolic (congestive) heart failure: Secondary | ICD-10-CM | POA: Diagnosis not present

## 2024-01-21 DIAGNOSIS — N182 Chronic kidney disease, stage 2 (mild): Secondary | ICD-10-CM | POA: Diagnosis not present

## 2024-01-21 DIAGNOSIS — J440 Chronic obstructive pulmonary disease with acute lower respiratory infection: Secondary | ICD-10-CM | POA: Diagnosis not present

## 2024-01-21 DIAGNOSIS — J449 Chronic obstructive pulmonary disease, unspecified: Secondary | ICD-10-CM | POA: Diagnosis not present

## 2024-01-21 DIAGNOSIS — I4891 Unspecified atrial fibrillation: Secondary | ICD-10-CM | POA: Diagnosis not present

## 2024-01-21 DIAGNOSIS — I130 Hypertensive heart and chronic kidney disease with heart failure and stage 1 through stage 4 chronic kidney disease, or unspecified chronic kidney disease: Secondary | ICD-10-CM | POA: Diagnosis not present

## 2024-01-21 DIAGNOSIS — J9611 Chronic respiratory failure with hypoxia: Secondary | ICD-10-CM | POA: Diagnosis not present

## 2024-01-21 NOTE — Telephone Encounter (Signed)
 Patient dropped off document Home Health Certificate (Order ID [id number]), to be filled out by provider. Patient requested to send it back via Fax within ASAP. Document is located in providers tray at front office.Please advise at [phone].

## 2024-01-24 DIAGNOSIS — I5043 Acute on chronic combined systolic (congestive) and diastolic (congestive) heart failure: Secondary | ICD-10-CM | POA: Diagnosis not present

## 2024-01-24 DIAGNOSIS — I4891 Unspecified atrial fibrillation: Secondary | ICD-10-CM | POA: Diagnosis not present

## 2024-01-24 DIAGNOSIS — J449 Chronic obstructive pulmonary disease, unspecified: Secondary | ICD-10-CM | POA: Diagnosis not present

## 2024-01-24 DIAGNOSIS — N182 Chronic kidney disease, stage 2 (mild): Secondary | ICD-10-CM | POA: Diagnosis not present

## 2024-01-24 DIAGNOSIS — J9611 Chronic respiratory failure with hypoxia: Secondary | ICD-10-CM | POA: Diagnosis not present

## 2024-01-24 DIAGNOSIS — J849 Interstitial pulmonary disease, unspecified: Secondary | ICD-10-CM | POA: Diagnosis not present

## 2024-01-24 DIAGNOSIS — J440 Chronic obstructive pulmonary disease with acute lower respiratory infection: Secondary | ICD-10-CM | POA: Diagnosis not present

## 2024-01-24 DIAGNOSIS — I130 Hypertensive heart and chronic kidney disease with heart failure and stage 1 through stage 4 chronic kidney disease, or unspecified chronic kidney disease: Secondary | ICD-10-CM | POA: Diagnosis not present

## 2024-01-26 NOTE — Telephone Encounter (Signed)
 Form signed and faxed back.

## 2024-01-27 DIAGNOSIS — N182 Chronic kidney disease, stage 2 (mild): Secondary | ICD-10-CM | POA: Diagnosis not present

## 2024-01-27 DIAGNOSIS — I5043 Acute on chronic combined systolic (congestive) and diastolic (congestive) heart failure: Secondary | ICD-10-CM | POA: Diagnosis not present

## 2024-01-27 DIAGNOSIS — I4891 Unspecified atrial fibrillation: Secondary | ICD-10-CM | POA: Diagnosis not present

## 2024-01-27 DIAGNOSIS — J449 Chronic obstructive pulmonary disease, unspecified: Secondary | ICD-10-CM | POA: Diagnosis not present

## 2024-01-27 DIAGNOSIS — J440 Chronic obstructive pulmonary disease with acute lower respiratory infection: Secondary | ICD-10-CM | POA: Diagnosis not present

## 2024-01-27 DIAGNOSIS — I130 Hypertensive heart and chronic kidney disease with heart failure and stage 1 through stage 4 chronic kidney disease, or unspecified chronic kidney disease: Secondary | ICD-10-CM | POA: Diagnosis not present

## 2024-01-27 DIAGNOSIS — J849 Interstitial pulmonary disease, unspecified: Secondary | ICD-10-CM | POA: Diagnosis not present

## 2024-01-27 DIAGNOSIS — J9611 Chronic respiratory failure with hypoxia: Secondary | ICD-10-CM | POA: Diagnosis not present

## 2024-01-31 ENCOUNTER — Emergency Department (HOSPITAL_COMMUNITY)

## 2024-01-31 ENCOUNTER — Other Ambulatory Visit: Payer: Self-pay

## 2024-01-31 ENCOUNTER — Encounter (HOSPITAL_COMMUNITY): Payer: Self-pay

## 2024-01-31 ENCOUNTER — Inpatient Hospital Stay (HOSPITAL_COMMUNITY)
Admission: EM | Admit: 2024-01-31 | Discharge: 2024-02-06 | DRG: 177 | Disposition: A | Discharge status: Home Health | Attending: Internal Medicine | Admitting: Internal Medicine

## 2024-01-31 DIAGNOSIS — N182 Chronic kidney disease, stage 2 (mild): Secondary | ICD-10-CM | POA: Diagnosis present

## 2024-01-31 DIAGNOSIS — Z8679 Personal history of other diseases of the circulatory system: Secondary | ICD-10-CM | POA: Diagnosis not present

## 2024-01-31 DIAGNOSIS — Z85528 Personal history of other malignant neoplasm of kidney: Secondary | ICD-10-CM

## 2024-01-31 DIAGNOSIS — Z905 Acquired absence of kidney: Secondary | ICD-10-CM

## 2024-01-31 DIAGNOSIS — Z86711 Personal history of pulmonary embolism: Secondary | ICD-10-CM | POA: Diagnosis not present

## 2024-01-31 DIAGNOSIS — U071 COVID-19: Principal | ICD-10-CM | POA: Diagnosis present

## 2024-01-31 DIAGNOSIS — I480 Paroxysmal atrial fibrillation: Secondary | ICD-10-CM | POA: Diagnosis not present

## 2024-01-31 DIAGNOSIS — I2510 Atherosclerotic heart disease of native coronary artery without angina pectoris: Secondary | ICD-10-CM | POA: Diagnosis not present

## 2024-01-31 DIAGNOSIS — N179 Acute kidney failure, unspecified: Secondary | ICD-10-CM | POA: Diagnosis present

## 2024-01-31 DIAGNOSIS — J440 Chronic obstructive pulmonary disease with acute lower respiratory infection: Secondary | ICD-10-CM | POA: Diagnosis present

## 2024-01-31 DIAGNOSIS — G894 Chronic pain syndrome: Secondary | ICD-10-CM | POA: Diagnosis not present

## 2024-01-31 DIAGNOSIS — R1111 Vomiting without nausea: Secondary | ICD-10-CM | POA: Diagnosis not present

## 2024-01-31 DIAGNOSIS — F4310 Post-traumatic stress disorder, unspecified: Secondary | ICD-10-CM | POA: Diagnosis present

## 2024-01-31 DIAGNOSIS — Z8709 Personal history of other diseases of the respiratory system: Secondary | ICD-10-CM | POA: Diagnosis not present

## 2024-01-31 DIAGNOSIS — J1282 Pneumonia due to coronavirus disease 2019: Secondary | ICD-10-CM | POA: Diagnosis present

## 2024-01-31 DIAGNOSIS — F411 Generalized anxiety disorder: Secondary | ICD-10-CM | POA: Diagnosis not present

## 2024-01-31 DIAGNOSIS — E871 Hypo-osmolality and hyponatremia: Secondary | ICD-10-CM | POA: Diagnosis not present

## 2024-01-31 DIAGNOSIS — I10 Essential (primary) hypertension: Secondary | ICD-10-CM | POA: Diagnosis not present

## 2024-01-31 DIAGNOSIS — Z96653 Presence of artificial knee joint, bilateral: Secondary | ICD-10-CM | POA: Diagnosis not present

## 2024-01-31 DIAGNOSIS — L89323 Pressure ulcer of left buttock, stage 3: Secondary | ICD-10-CM | POA: Diagnosis present

## 2024-01-31 DIAGNOSIS — J849 Interstitial pulmonary disease, unspecified: Secondary | ICD-10-CM | POA: Diagnosis present

## 2024-01-31 DIAGNOSIS — Z743 Need for continuous supervision: Secondary | ICD-10-CM | POA: Diagnosis not present

## 2024-01-31 DIAGNOSIS — R0602 Shortness of breath: Secondary | ICD-10-CM | POA: Diagnosis not present

## 2024-01-31 DIAGNOSIS — J8410 Pulmonary fibrosis, unspecified: Secondary | ICD-10-CM | POA: Diagnosis not present

## 2024-01-31 DIAGNOSIS — E890 Postprocedural hypothyroidism: Secondary | ICD-10-CM

## 2024-01-31 DIAGNOSIS — I130 Hypertensive heart and chronic kidney disease with heart failure and stage 1 through stage 4 chronic kidney disease, or unspecified chronic kidney disease: Secondary | ICD-10-CM | POA: Diagnosis present

## 2024-01-31 DIAGNOSIS — J90 Pleural effusion, not elsewhere classified: Secondary | ICD-10-CM | POA: Diagnosis not present

## 2024-01-31 DIAGNOSIS — R509 Fever, unspecified: Secondary | ICD-10-CM | POA: Diagnosis not present

## 2024-01-31 DIAGNOSIS — M109 Gout, unspecified: Secondary | ICD-10-CM | POA: Diagnosis not present

## 2024-01-31 DIAGNOSIS — I5032 Chronic diastolic (congestive) heart failure: Secondary | ICD-10-CM | POA: Diagnosis present

## 2024-01-31 DIAGNOSIS — J9621 Acute and chronic respiratory failure with hypoxia: Secondary | ICD-10-CM | POA: Diagnosis not present

## 2024-01-31 DIAGNOSIS — I5030 Unspecified diastolic (congestive) heart failure: Secondary | ICD-10-CM | POA: Diagnosis present

## 2024-01-31 DIAGNOSIS — Z7984 Long term (current) use of oral hypoglycemic drugs: Secondary | ICD-10-CM

## 2024-01-31 DIAGNOSIS — E039 Hypothyroidism, unspecified: Secondary | ICD-10-CM | POA: Diagnosis not present

## 2024-01-31 DIAGNOSIS — Z800 Family history of malignant neoplasm of digestive organs: Secondary | ICD-10-CM

## 2024-01-31 DIAGNOSIS — E66811 Obesity, class 1: Secondary | ICD-10-CM | POA: Diagnosis present

## 2024-01-31 DIAGNOSIS — Z6831 Body mass index (BMI) 31.0-31.9, adult: Secondary | ICD-10-CM

## 2024-01-31 DIAGNOSIS — Z87442 Personal history of urinary calculi: Secondary | ICD-10-CM

## 2024-01-31 DIAGNOSIS — G4733 Obstructive sleep apnea (adult) (pediatric): Secondary | ICD-10-CM | POA: Diagnosis not present

## 2024-01-31 DIAGNOSIS — Z8546 Personal history of malignant neoplasm of prostate: Secondary | ICD-10-CM

## 2024-01-31 DIAGNOSIS — R000 Tachycardia, unspecified: Secondary | ICD-10-CM | POA: Diagnosis not present

## 2024-01-31 DIAGNOSIS — Z8051 Family history of malignant neoplasm of kidney: Secondary | ICD-10-CM

## 2024-01-31 DIAGNOSIS — R918 Other nonspecific abnormal finding of lung field: Secondary | ICD-10-CM | POA: Diagnosis not present

## 2024-01-31 DIAGNOSIS — G250 Essential tremor: Secondary | ICD-10-CM | POA: Diagnosis present

## 2024-01-31 LAB — I-STAT ARTERIAL BLOOD GAS, ED
Acid-base deficit: 1 mmol/L (ref 0.0–2.0)
Bicarbonate: 22.6 mmol/L (ref 20.0–28.0)
Calcium, Ion: 1.13 mmol/L — ABNORMAL LOW (ref 1.15–1.40)
HCT: 44 % (ref 39.0–52.0)
Hemoglobin: 15 g/dL (ref 13.0–17.0)
O2 Saturation: 97 %
Patient temperature: 98
Potassium: 3.5 mmol/L (ref 3.5–5.1)
Sodium: 132 mmol/L — ABNORMAL LOW (ref 135–145)
TCO2: 24 mmol/L (ref 22–32)
pCO2 arterial: 35.1 mmHg (ref 32–48)
pH, Arterial: 7.416 (ref 7.35–7.45)
pO2, Arterial: 85 mmHg (ref 83–108)

## 2024-01-31 LAB — COMPREHENSIVE METABOLIC PANEL WITH GFR
ALT: 20 U/L (ref 0–44)
AST: 32 U/L (ref 15–41)
Albumin: 3 g/dL — ABNORMAL LOW (ref 3.5–5.0)
Alkaline Phosphatase: 68 U/L (ref 38–126)
Anion gap: 13 (ref 5–15)
BUN: 13 mg/dL (ref 8–23)
CO2: 23 mmol/L (ref 22–32)
Calcium: 8.6 mg/dL — ABNORMAL LOW (ref 8.9–10.3)
Chloride: 96 mmol/L — ABNORMAL LOW (ref 98–111)
Creatinine, Ser: 1.44 mg/dL — ABNORMAL HIGH (ref 0.61–1.24)
GFR, Estimated: 49 mL/min — ABNORMAL LOW (ref >60)
Glucose, Bld: 129 mg/dL — ABNORMAL HIGH (ref 70–99)
Potassium: 3.6 mmol/L (ref 3.5–5.1)
Sodium: 132 mmol/L — ABNORMAL LOW (ref 135–145)
Total Bilirubin: 1.7 mg/dL — ABNORMAL HIGH (ref 0.0–1.2)
Total Protein: 8.2 g/dL — ABNORMAL HIGH (ref 6.5–8.1)

## 2024-01-31 LAB — CBC WITH DIFFERENTIAL/PLATELET
Abs Immature Granulocytes: 0.04 K/uL (ref 0.00–0.07)
Basophils Absolute: 0 K/uL (ref 0.0–0.1)
Basophils Relative: 0 %
Eosinophils Absolute: 0.1 K/uL (ref 0.0–0.5)
Eosinophils Relative: 1 %
HCT: 45 % (ref 39.0–52.0)
Hemoglobin: 14.9 g/dL (ref 13.0–17.0)
Immature Granulocytes: 0 %
Lymphocytes Relative: 29 %
Lymphs Abs: 2.7 K/uL (ref 0.7–4.0)
MCH: 30.3 pg (ref 26.0–34.0)
MCHC: 33.1 g/dL (ref 30.0–36.0)
MCV: 91.5 fL (ref 80.0–100.0)
Monocytes Absolute: 1.7 K/uL — ABNORMAL HIGH (ref 0.1–1.0)
Monocytes Relative: 19 %
Neutro Abs: 4.7 K/uL (ref 1.7–7.7)
Neutrophils Relative %: 51 %
Platelets: 290 K/uL (ref 150–400)
RBC: 4.92 MIL/uL (ref 4.22–5.81)
RDW: 14.1 % (ref 11.5–15.5)
WBC: 9.3 K/uL (ref 4.0–10.5)
nRBC: 0 % (ref 0.0–0.2)

## 2024-01-31 LAB — BRAIN NATRIURETIC PEPTIDE: B Natriuretic Peptide: 52.7 pg/mL (ref 0.0–100.0)

## 2024-01-31 LAB — URINALYSIS, W/ REFLEX TO CULTURE (INFECTION SUSPECTED)
Bilirubin Urine: NEGATIVE
Glucose, UA: NEGATIVE mg/dL
Ketones, ur: NEGATIVE mg/dL
Leukocytes,Ua: NEGATIVE
Nitrite: NEGATIVE
Protein, ur: 30 mg/dL — AB
Specific Gravity, Urine: 1.016 (ref 1.005–1.030)
pH: 5 (ref 5.0–8.0)

## 2024-01-31 LAB — LACTIC ACID, PLASMA
Lactic Acid, Venous: 2.8 mmol/L (ref 0.5–1.9)
Lactic Acid, Venous: 1.8 mmol/L (ref 0.5–1.9)

## 2024-01-31 LAB — I-STAT CG4 LACTIC ACID, ED
Lactic Acid, Venous: 3 mmol/L (ref 0.5–1.9)
Lactic Acid, Venous: 3.2 mmol/L (ref 0.5–1.9)

## 2024-01-31 LAB — RESP PANEL BY RT-PCR (RSV, FLU A&B, COVID)  RVPGX2
Influenza A by PCR: NEGATIVE
Influenza B by PCR: NEGATIVE
Resp Syncytial Virus by PCR: NEGATIVE
SARS Coronavirus 2 by RT PCR: POSITIVE — AB

## 2024-01-31 LAB — PROCALCITONIN: Procalcitonin: 0.14 ng/mL

## 2024-01-31 LAB — TROPONIN I (HIGH SENSITIVITY)
Troponin I (High Sensitivity): 9 ng/L (ref <18)
Troponin I (High Sensitivity): 9 ng/L (ref <18)

## 2024-01-31 LAB — PROTIME-INR
INR: 1.2 (ref 0.8–1.2)
Prothrombin Time: 16.1 s — ABNORMAL HIGH (ref 11.4–15.2)

## 2024-01-31 MED ORDER — IPRATROPIUM-ALBUTEROL 0.5-2.5 (3) MG/3ML IN SOLN
3.0000 mL | RESPIRATORY_TRACT | Status: DC | PRN
  Administered 2024-02-02 – 2024-02-04 (×2): 3 mL via RESPIRATORY_TRACT
  Filled 2024-01-31 (×4): qty 3

## 2024-01-31 MED ORDER — POLYETHYLENE GLYCOL 3350 17 G PO PACK
17.0000 g | PACK | Freq: Every day | ORAL | Status: DC | PRN
  Administered 2024-02-02 – 2024-02-03 (×2): 17 g via ORAL
  Filled 2024-01-31 (×2): qty 1

## 2024-01-31 MED ORDER — TRAZODONE HCL 50 MG PO TABS
50.0000 mg | ORAL_TABLET | Freq: Every day | ORAL | Status: DC
  Administered 2024-01-31 – 2024-02-05 (×6): 50 mg via ORAL
  Filled 2024-01-31 (×6): qty 1

## 2024-01-31 MED ORDER — ESCITALOPRAM OXALATE 20 MG PO TABS
20.0000 mg | ORAL_TABLET | Freq: Every day | ORAL | Status: DC
Start: 2024-02-01 — End: 2024-02-06
  Administered 2024-02-01 – 2024-02-06 (×6): 20 mg via ORAL
  Filled 2024-01-31 (×6): qty 1

## 2024-01-31 MED ORDER — SODIUM CHLORIDE 0.9% FLUSH
3.0000 mL | Freq: Two times a day (BID) | INTRAVENOUS | Status: DC
  Administered 2024-01-31 – 2024-02-06 (×12): 3 mL via INTRAVENOUS

## 2024-01-31 MED ORDER — GABAPENTIN 300 MG PO CAPS
300.0000 mg | ORAL_CAPSULE | Freq: Three times a day (TID) | ORAL | Status: DC
  Administered 2024-01-31 – 2024-02-06 (×17): 300 mg via ORAL
  Filled 2024-01-31 (×17): qty 1

## 2024-01-31 MED ORDER — IPRATROPIUM-ALBUTEROL 0.5-2.5 (3) MG/3ML IN SOLN
3.0000 mL | Freq: Once | RESPIRATORY_TRACT | Status: AC
  Administered 2024-01-31: 3 mL via RESPIRATORY_TRACT
  Filled 2024-01-31: qty 3

## 2024-01-31 MED ORDER — SODIUM CHLORIDE 0.9 % IV SOLN
2.0000 g | Freq: Once | INTRAVENOUS | Status: AC
  Administered 2024-01-31: 2 g via INTRAVENOUS
  Filled 2024-01-31: qty 20

## 2024-01-31 MED ORDER — ACETAMINOPHEN 650 MG RE SUPP: 650.0000 mg | Freq: Four times a day (QID) | RECTAL | Status: DC | PRN

## 2024-01-31 MED ORDER — LACTATED RINGERS IV BOLUS
500.0000 mL | Freq: Once | INTRAVENOUS | Status: AC
  Administered 2024-01-31: 500 mL via INTRAVENOUS

## 2024-01-31 MED ORDER — SODIUM CHLORIDE 0.9 % IV SOLN
500.0000 mg | Freq: Once | INTRAVENOUS | Status: AC
  Administered 2024-01-31: 500 mg via INTRAVENOUS
  Filled 2024-01-31: qty 5

## 2024-01-31 MED ORDER — ENOXAPARIN SODIUM 40 MG/0.4ML IJ SOSY
40.0000 mg | PREFILLED_SYRINGE | INTRAMUSCULAR | Status: DC
  Administered 2024-02-01 – 2024-02-05 (×5): 40 mg via SUBCUTANEOUS
  Filled 2024-01-31 (×5): qty 0.4

## 2024-01-31 MED ORDER — SODIUM CHLORIDE 0.9 % IV BOLUS
500.0000 mL | Freq: Once | INTRAVENOUS | Status: AC
  Administered 2024-01-31: 500 mL via INTRAVENOUS

## 2024-01-31 MED ORDER — ACETAMINOPHEN 325 MG PO TABS
650.0000 mg | ORAL_TABLET | Freq: Four times a day (QID) | ORAL | Status: DC | PRN
  Administered 2024-02-05: 650 mg via ORAL
  Filled 2024-01-31: qty 2

## 2024-01-31 MED ORDER — BUSPIRONE HCL 5 MG PO TABS
5.0000 mg | ORAL_TABLET | Freq: Every evening | ORAL | Status: DC | PRN
  Administered 2024-01-31 – 2024-02-03 (×3): 5 mg via ORAL
  Filled 2024-01-31 (×3): qty 1

## 2024-01-31 MED ORDER — SODIUM CHLORIDE 0.9 % IV SOLN: INTRAVENOUS | Status: DC

## 2024-01-31 NOTE — ED Notes (Signed)
 CCMD is monitoring this patient.

## 2024-01-31 NOTE — ED Triage Notes (Signed)
 Pt coming in from home where he was having trouble breathing. Pt was on 5 L wich is baseline. Pt denies with any urinary issues but ems suspects urinary issues. Pt is complaining of nauysea. Pt has been vomiting and coughing up green flem. Pt has had 2 dounebs, 4mg  zofran  IM .   100.4 Cbg 99 Nsr  Hr 96 18 lfa  125 solumedrol.  Bp 139/65 91% on the neb

## 2024-01-31 NOTE — ED Notes (Signed)
 CCMD called, pt on monitor

## 2024-01-31 NOTE — ED Provider Notes (Signed)
 [facility] AT [facility] Provider Note   CSN: [id number] Arrival date & time: 01/31/24  9067     Patient presents with: Shortness of Breath   Wayne Booth is a 83 y.o. male.    Shortness of Breath Associated symptoms: cough and vomiting   Patient is an 83 year old male to the ED today for concerns for worsening shortness of breath and onset of vomiting that happened earlier today, with persistent nausea and productive cough, producing green sputum starting 3 days ago.  Reports that he had tested positive for the flu at home today.  Medical history of HTN, hypothyroidism, atrial flutter, GAD, chronic PE, heart failure, chronic respiratory failure, paroxysmal A-fib, CKD, CAD, interstitial lung disease, gout, peripheral neuropathy.  Not anticoagulated. Chronically on 5 L O2 at baseline.  Endorses body aches and chills. Denies fever, headache, vision changes, chest pain, abdominal pain, dysuria, diarrhea, lower leg swelling.     Prior to Admission medications   Medication Sig Start Date End Date Taking? Authorizing Provider  acetaminophen  (TYLENOL ) 325 MG tablet Take 2 tablets (650 mg total) by mouth every 6 (six) hours as needed for mild pain (pain score 1-3) or fever (or Fever >/= 101). 12/24/23   Arrien, Elidia Sieving, MD  albuterol  (PROVENTIL ) (2.5 MG/3ML) 0.083% nebulizer solution Take 3 mLs (2.5 mg total) by nebulization every 6 (six) hours as needed for wheezing or shortness of breath. 10/12/22   Parrett, Madelin RAMAN, NP  albuterol  (VENTOLIN  HFA) 108 (90 Base) MCG/ACT inhaler INHALE 2 PUFFS BY MOUTH EVERY 6 HOURS AS NEEDED FOR WHEEZING OR SHORTNESS OF BREATH 07/13/23   Parrett, Madelin RAMAN, NP  budesonide  (PULMICORT ) 0.25 MG/2ML nebulizer solution Take 2 mLs (0.25 mg total) by nebulization 2 (two) times daily. 09/07/23   Jillian Buttery, MD  busPIRone  (BUSPAR ) 5 MG tablet Take 5 mg by mouth at bedtime as needed (anxiety). 11/03/23   [provider]   empagliflozin  (JARDIANCE ) 10 MG TABS tablet Take 1 tablet (10 mg total) by mouth daily. 12/25/23   Arrien, Mauricio Daniel, MD  escitalopram  (LEXAPRO ) 20 MG tablet TAKE 1 TABLET BY MOUTH DAILY 06/07/23   Katrinka Garnette KIDD, MD  furosemide  (LASIX ) 40 MG tablet Take 1 tablet (40 mg total) by mouth daily. 12/24/23   Arrien, Elidia Sieving, MD  gabapentin  (NEURONTIN ) 300 MG capsule TAKE 1 CAPSULE BY MOUTH 3 TIMES A DAY 01/10/24   Katrinka Garnette KIDD, MD  ipratropium-albuterol  (DUONEB) 0.5-2.5 (3) MG/3ML SOLN Take 3 mLs by nebulization every 6 (six) hours as needed. 09/07/23   Jillian Buttery, MD  ondansetron  (ZOFRAN ) 4 MG tablet Take 1 tablet (4 mg total) by mouth every 6 (six) hours as needed for nausea. 09/17/23   Will Almarie MATSU, MD  OXYGEN  Inhale 5 L into the lungs continuous.    [provider]  spironolactone  (ALDACTONE ) 25 MG tablet Take 1 tablet (25 mg total) by mouth daily. 12/25/23 01/24/24  Arrien, Elidia Sieving, MD  traZODone  (DESYREL ) 50 MG tablet Take 1 tablet (50 mg total) by mouth at bedtime. 12/03/23   Katrinka Garnette KIDD, MD    Allergies: Ambien  [zolpidem ]    Review of Systems  HENT:  Positive for congestion.   Respiratory:  Positive for cough and shortness of breath.   Gastrointestinal:  Positive for vomiting.  All other systems reviewed and are negative.   Updated Vital Signs BP 107/64   Pulse (!) 107   Temp 98 F (36.7 C) (Oral)   Resp ROLLEN)  21   Ht 5' 11 (1.803 m)   Wt 103.8 kg   SpO2 96%   BMI 31.92 kg/m   Physical Exam Vitals and nursing note reviewed.  Constitutional:      General: He is not in acute distress.    Appearance: Normal appearance. He is ill-appearing. He is not diaphoretic.  HENT:     Head: Normocephalic and atraumatic.  Eyes:     General: No scleral icterus.       Right eye: No discharge.        Left eye: No discharge.     Extraocular Movements: Extraocular movements intact.     Conjunctiva/sclera: Conjunctivae normal.  Neck:      Vascular: No JVD.     Trachea: No tracheal deviation.  Cardiovascular:     Rate and Rhythm: Normal rate and regular rhythm.     Pulses: Normal pulses.     Heart sounds: Normal heart sounds. No murmur heard.    No friction rub. No gallop.  Pulmonary:     Effort: Pulmonary effort is normal. Tachypnea present. No respiratory distress.     Breath sounds: Examination of the right-upper field reveals wheezing and rhonchi. Examination of the left-upper field reveals wheezing. Examination of the right-middle field reveals wheezing and rhonchi. Examination of the left-middle field reveals wheezing. Examination of the right-lower field reveals wheezing and rhonchi. Examination of the left-lower field reveals wheezing. Wheezing and rhonchi present.  Chest:     Chest wall: No tenderness.  Abdominal:     General: Abdomen is flat. There is no distension.     Palpations: Abdomen is soft.     Tenderness: There is no abdominal tenderness. There is no right CVA tenderness, left CVA tenderness, guarding or rebound.  Musculoskeletal:        General: No swelling, deformity or signs of injury.     Cervical back: Normal range of motion. No rigidity.     Right lower leg: No tenderness. No edema.     Left lower leg: No tenderness. No edema.  Skin:    General: Skin is warm and dry.     Findings: No bruising, erythema, lesion or rash.  Neurological:     General: No focal deficit present.     Mental Status: He is alert and oriented to person, place, and time. Mental status is at baseline.     Sensory: No sensory deficit.     Motor: No weakness.  Psychiatric:        Mood and Affect: Mood normal. Mood is not anxious.     (all labs ordered are listed, but only abnormal results are displayed) Labs Reviewed  RESP PANEL BY RT-PCR (RSV, FLU A&B, COVID)  RVPGX2 - Abnormal; Notable for the following components:      Result Value   SARS Coronavirus 2 by RT PCR POSITIVE (*)    All other components within normal  limits  COMPREHENSIVE METABOLIC PANEL WITH GFR - Abnormal; Notable for the following components:   Sodium 132 (*)    Chloride 96 (*)    Glucose, Bld 129 (*)    Creatinine, Ser 1.44 (*)    Calcium 8.6 (*)    Total Protein 8.2 (*)    Albumin  3.0 (*)    Total Bilirubin 1.7 (*)    GFR, Estimated 49 (*)    All other components within normal limits  CBC WITH DIFFERENTIAL/PLATELET - Abnormal; Notable for the following components:   Monocytes Absolute 1.7 (*)  All other components within normal limits  PROTIME-INR - Abnormal; Notable for the following components:   Prothrombin Time 16.1 (*)    All other components within normal limits  I-STAT CG4 LACTIC ACID, ED - Abnormal; Notable for the following components:   Lactic Acid, Venous 3.0 (*)    All other components within normal limits  I-STAT CG4 LACTIC ACID, ED - Abnormal; Notable for the following components:   Lactic Acid, Venous 3.2 (*)    All other components within normal limits  I-STAT ARTERIAL BLOOD GAS, ED - Abnormal; Notable for the following components:   Sodium 132 (*)    Calcium, Ion 1.13 (*)    All other components within normal limits  CULTURE, BLOOD (ROUTINE X 2)  CULTURE, BLOOD (ROUTINE X 2)  MRSA NEXT GEN BY PCR, NASAL  BRAIN NATRIURETIC PEPTIDE  URINALYSIS, W/ REFLEX TO CULTURE (INFECTION SUSPECTED)  PROCALCITONIN  TROPONIN I (HIGH SENSITIVITY)  TROPONIN I (HIGH SENSITIVITY)    EKG: EKG Interpretation Date/Time:  Monday January 31 2024 09:42:40 EDT Ventricular Rate:  100 PR Interval:  217 QRS Duration:  113 QT Interval:  335 QTC Calculation: 432 R Axis:   -16  Text Interpretation: Sinus tachycardia Paired ventricular premature complexes Prolonged PR interval Low voltage, precordial leads Consider anterior infarct Confirmed by Patsey Lot [phone]) on 01/31/2024 2:41:32 PM  Radiology: [facility] Chest Port 1 View if patient is in a treatment room. Result Date: 01/31/2024 CLINICAL DATA:  Suspected Sepsis EXAM:  PORTABLE CHEST 1 VIEW COMPARISON:  Chest x-ray 12/31/2023 FINDINGS: The heart and mediastinal contours are unchanged. Atherosclerotic plaque. Persistent bilateral mid to lower lung zone airspace opacities of the lower lobes right upper lung zone patchy airspace opacity. No pulmonary edema. Persistent bilateral trace to small volume pleural effusions. No pneumothorax. No acute osseous abnormality. IMPRESSION: 1. Persistent bilateral mid to lower lung zone airspace opacities of the lower lobes right upper lung zone patchy airspace opacity. 2. Persistent trace to small volume pleural effusions. 3. Followup PA and lateral chest X-ray is recommended in 3-4 weeks following therapy to ensure resolution and exclude underlying malignancy. Electronically Signed   By: Morgane  Naveau M.D.   On: 01/31/2024 10:29    Procedures   Medications Ordered in the ED  azithromycin  (ZITHROMAX ) 500 mg in sodium chloride  0.9 % 250 mL IVPB (has no administration in time range)  cefTRIAXone  (ROCEPHIN ) 2 g in sodium chloride  0.9 % 100 mL IVPB (2 g Intravenous New Bag/Given 01/31/24 1434)  busPIRone  (BUSPAR ) tablet 5 mg (has no administration in time range)  escitalopram  (LEXAPRO ) tablet 20 mg (has no administration in time range)  traZODone  (DESYREL ) tablet 50 mg (has no administration in time range)  gabapentin  (NEURONTIN ) capsule 300 mg (has no administration in time range)  ipratropium-albuterol  (DUONEB) 0.5-2.5 (3) MG/3ML nebulizer solution 3 mL (has no administration in time range)  enoxaparin  (LOVENOX ) injection 40 mg (has no administration in time range)  sodium chloride  flush (NS) 0.9 % injection 3 mL (has no administration in time range)  acetaminophen  (TYLENOL ) tablet 650 mg (has no administration in time range)    Or  acetaminophen  (TYLENOL ) suppository 650 mg (has no administration in time range)  polyethylene glycol (MIRALAX  / GLYCOLAX ) packet 17 g (has no administration in time range)  0.9 %  sodium chloride   infusion (has no administration in time range)  sodium chloride  0.9 % bolus 500 mL (0 mLs Intravenous Stopped 01/31/24 1157)  lactated ringers  bolus 500 mL (500 mLs Intravenous New Bag/Given  01/31/24 1239)  ipratropium-albuterol  (DUONEB) 0.5-2.5 (3) MG/3ML nebulizer solution 3 mL (3 mLs Nebulization Given 01/31/24 1434)                                  Medical Decision Making Amount and/or Complexity of Data Reviewed Labs: ordered. Radiology: ordered.  Risk Prescription drug management. Decision regarding hospitalization.   This patient is a 83 year old male who presents to the ED for concern of shortness of breath, cough, congestion, onset of vomiting today.  Does appear to have a history of frequent hospitalization.  Recently placed on doxycycline  and Rocephin  with previous admission with pulmonary opacities which were indeterminant for pneumonia.  Here today with worsening shortness of breath and tachycardia.  On physical exam, patient is in no acute distress, afebrile, alert and orient x 4.  Notably is tachypneic as well as tachycardic, speaking in partial sentences.  Baseline 5 L with oxygen  saturation of 98%.  Wheezing and rhonchi heard bilaterally.  No lower leg edema noted.  RRR, no murmurs.  No abdominal tenderness to palpation, no CVA tenderness.  Labs show AKI as well as elevated lactic acid.  With opacities that are chronic from previous chest x-ray.  With patient current symptoms, and noting to have been tested positive for flu we will repeat respiratory panel as well as baseline labs and provide liter of fluid, suspecting likely overdiuresis causing AKI.  With positive COVID test likely the cause of his tachypnea today.  With uncertain etiology for the opacities, and having recently been treated with doxycycline  and Rocephin , consulted with pharmacy who recommended Rocephin  and azithromycin  as well as repeating MRSA PCR.  With patient's current symptoms, we will seek to admit,  provided DuoNebs and antibiotics with fluids as noted above.  Patient care transferred over to [facility] hospitalist at that time.  Differential diagnoses prior to evaluation: The emergent differential diagnosis includes, but is not limited to,  sepsis, COPD exacerbation, pneumonia, PE, ACS, acute on chronic heart failure, UTI, dehydration, URI, bronchitis, pneumothorax, pleural effusions . This is not an exhaustive differential.   Past Medical History / Co-morbidities / Social History: HTN, hypothyroidism, atrial flutter, GAD, chronic PE, heart failure, chronic respiratory failure, paroxysmal A-fib, CKD, CAD, interstitial lung disease, gout, peripheral neuropathy.  Baseline 5 L O2  Additional history: Chart reviewed. Pertinent results include:   Discharge on 01/02/2024 in the hospital for acute on chronic heart failure exacerbation requiring IV Lasix  for diuresis and started on biotics for presumed pneumonia.  Respiratory distress resolved after heart failure treatment.  Provided Rocephin  and doxycycline  empirically for suspected pneumonia despite no chest pain or productive cough.  Transitioned doxycycline .  Lab Tests/Imaging studies: I personally interpreted labs/imaging and the pertinent results include:   CBC unremarkable CMP does show send mild hyponatremia, as well as an AKI PT elevated 16.1 Troponin unremarkable BMP unremarkable Lactic acid initially was 3.0 but then second was 3.2, UA pending, blood cultures pending, MRSA PCR pending  Chest x-ray Shows persistent bilateral mid to lower lung opacities to the lower lobe of right upper lung as well as trace pleural effusions I agree with the radiologist interpretation.  Cardiac monitoring: EKG obtained and interpreted by myself and attending physician which shows: Sinus tachycardia with PVCs, also noted to have a elevated prolonged PR.   Medications: I ordered medication including DuoNebs, azithromycin , Rocephin , NS, LR.  I have  reviewed the patients home medicines and have made adjustments  as needed.  Critical Interventions: None  Social Determinants of Health: Notably has frequent hospitalization, happening once monthly for this whole year.  Disposition: After consideration of the diagnostic results and the patients response to treatment, I feel that the patient would benefit from admission, with patient care transferred over to [facility] hospitalist at that time.   Final diagnoses:  SARS-CoV-2 positive  AKI (acute kidney injury) [facility])    ED Discharge Orders     None          Damonica Chopra S, PA-C 01/31/24 1445    Patsey Lot, MD 01/31/24 1538

## 2024-01-31 NOTE — H&P (Signed)
 History and Physical   Wayne Booth FMW:[id number] DOB: [birth year] DOA: 01/31/2024  PCP: Katrinka Garnette KIDD, MD   Patient coming from: Home  Chief Complaint: Nausea, vomiting, cough, shortness of breath  HPI: Wayne Booth is a 83 y.o. male with medical history significant of hypertension, CKD 2, hypothyroidism, paroxysmal atrial fibrillation, CAD, COPD, gout, chronic diastolic CHF, prostate cancer status post seed implantation, renal cell carcinoma status post nephrectomy, PTSD, anxiety, post-COVID pulmonary fibrosis, ILD, essential tremor, PE, OSA, obesity, chronic pain, chronic respiratory failure with hypoxia on 5 L presenting with nausea and vomiting as well as shortness of breath and cough.  Patient was recently admitted last month for CHF exacerbation and also received antibiotics for presumed atypical pneumonia while admitted.  No COVID test at that time.  Patient states that he started to have nausea and vomiting as well as shortness of breath with cough 3 days ago.  Cough has been productive of green sputum.  He vomited today and reportedly had a positive flu test at home.  Denies fevers, chills, abdominal pain, constipation, diarrhea.  ED Course: Vital signs in the ED notable for heart rate in the 50s-110s, respirate in the 20s, requiring home 5 L to maintain saturations.  Lab workup included CMP with sodium 132, chloride 96, creatinine elevated to 1.44 from baseline 1.1, glucose 129, calcium 8.6, protein 8.2, albumin  3.0, T. bili 1.7.  CBC within normal limits.  PT 16.1, INR normal.  Lactic acid 3.0, 3.2.  Troponin normal x 2.  BNP normal.  Respiratory panel for flu COVID and RSV was positive for COVID.  Urinalysis and blood cultures pending.  ABG with normal pH and normal pCO2.  Chest x-ray showed persistent opacities bilaterally as well as persistent small pleural effusion.  Patient received ceftriaxone , azithromycin , 1 L IV fluids, DuoNeb in the ED.  Review of Systems: As  per HPI otherwise all other systems reviewed and are negative.  Past Medical History:  Diagnosis Date   Allergy    States his nose runs when he is around grease.    Anxiety    Arthritis    Atrial fibrillation (HCC)    Cancer (HCC)    Community acquired pneumonia 06/18/2017   Coronary artery disease    Gout 08/14/2015   Allopurinol  150mg . Working back to this.      HCAP (healthcare-associated pneumonia) 02/03/2018   MRSA. Sepsis/septic shock/intubation as result 01/2018 hospitalization then needed CIR     History of total knee replacement    Hypertension    Hypothyroidism    Kidney cancer, primary, with metastasis from kidney to other site [facility])    Kidney stone    Lobar pneumonia (HCC) 08/29/2023   OSA (obstructive sleep apnea)    pt does not wear cpap at night   Pituitary cyst (HCC)    Pneumonia    hx of   Prostate cancer (HCC)    Severe sepsis (HCC) 08/29/2023    Past Surgical History:  Procedure Laterality Date   ABLATION OF DYSRHYTHMIC FOCUS  01/28/2018   ANTERIOR CERVICAL DECOMP/DISCECTOMY FUSION N/A 08/29/2012   Procedure: ANTERIOR CERVICAL DECOMPRESSION/DISCECTOMY FUSION 1 LEVEL;  Surgeon: Alm GORMAN Molt, MD;  Location: [facility] NEURO ORS;  Service: Neurosurgery;  Laterality: N/A;   APPENDECTOMY     ATRIAL FIBRILLATION ABLATION N/A 01/28/2018   Procedure: ATRIAL FIBRILLATION ABLATION;  Surgeon: Inocencio Soyla Lunger, MD;  Location: [facility] INVASIVE CV LAB;  Service: Cardiovascular;  Laterality: N/A;   CARDIAC CATHETERIZATION  2008  clean   CHOLECYSTECTOMY     COLONOSCOPY W/ POLYPECTOMY     CRANIOTOMY N/A 11/08/2012   Procedure: CRANIOTOMY HYPOPHYSECTOMY TRANSNASAL APPROACH;  Surgeon: Darina MALVA Boehringer, MD;  Location: [facility] NEURO ORS;  Service: Neurosurgery;  Laterality: N/A;  Transphenoidal Resection of Pituitary Tumor   FINGER SURGERY Left 2017   Dr Camella   INSERTION PROSTATE RADIATION SEED     IR THORACENTESIS ASP PLEURAL SPACE W/IMG GUIDE  04/15/2018   KIDNEY STONE SURGERY      KNEE ARTHROSCOPY  2007   left   NEPHRECTOMY Left    POSTERIOR CERVICAL LAMINECTOMY Left 04/24/2015   Procedure: Foraminotomy cervical five - cervical six cervical six - cervical seven left;  Surgeon: Alm GORMAN Molt, MD;  Location: [facility] NEURO ORS;  Service: Neurosurgery;  Laterality: Left;   REPLACEMENT TOTAL KNEE BILATERAL     RIGHT/LEFT HEART CATH AND CORONARY ANGIOGRAPHY N/A 07/14/2019   Procedure: RIGHT/LEFT HEART CATH AND CORONARY ANGIOGRAPHY;  Surgeon: Dann Candyce GORMAN, MD;  Location: [facility] INVASIVE CV LAB;  Service: Cardiovascular;  Laterality: N/A;    Social History  reports that he has never smoked. He has never used smokeless tobacco. He reports current alcohol  use of about 2.0 standard drinks of alcohol  per week. He reports that he does not use drugs.  Allergies  Allergen Reactions   Ambien  [Zolpidem ] Anxiety and Other (See Comments)    Anxiety and agitation when tried as sleeper in hospital aug 2019    Family History  Problem Relation Age of Onset   Cancer Mother        stomach, died when he was young   Cancer Father        ?mouth, died before patient born   Kidney cancer Sister    Colon cancer Brother 62   Gastric cancer Son 14       stomach cancer per pt   Esophageal cancer Neg Hx    Rectal cancer Neg Hx    Stomach cancer Neg Hx   Reviewed on admission  Prior to Admission medications   Medication Sig Start Date End Date Taking? Authorizing Provider  acetaminophen  (TYLENOL ) 325 MG tablet Take 2 tablets (650 mg total) by mouth every 6 (six) hours as needed for mild pain (pain score 1-3) or fever (or Fever >/= 101). 12/24/23   Arrien, Elidia Sieving, MD  albuterol  (PROVENTIL ) (2.5 MG/3ML) 0.083% nebulizer solution Take 3 mLs (2.5 mg total) by nebulization every 6 (six) hours as needed for wheezing or shortness of breath. 10/12/22   Parrett, Madelin GORMAN, NP  albuterol  (VENTOLIN  HFA) 108 (90 Base) MCG/ACT inhaler INHALE 2 PUFFS BY MOUTH EVERY 6 HOURS AS NEEDED FOR WHEEZING OR  SHORTNESS OF BREATH 07/13/23   Parrett, Madelin GORMAN, NP  budesonide  (PULMICORT ) 0.25 MG/2ML nebulizer solution Take 2 mLs (0.25 mg total) by nebulization 2 (two) times daily. 09/07/23   Jillian Buttery, MD  busPIRone  (BUSPAR ) 5 MG tablet Take 5 mg by mouth at bedtime as needed (anxiety). 11/03/23   [provider]  empagliflozin  (JARDIANCE ) 10 MG TABS tablet Take 1 tablet (10 mg total) by mouth daily. 12/25/23   Arrien, Mauricio Daniel, MD  escitalopram  (LEXAPRO ) 20 MG tablet TAKE 1 TABLET BY MOUTH DAILY 06/07/23   Katrinka Garnette MALVA, MD  furosemide  (LASIX ) 40 MG tablet Take 1 tablet (40 mg total) by mouth daily. 12/24/23   Arrien, Mauricio Daniel, MD  gabapentin  (NEURONTIN ) 300 MG capsule TAKE 1 CAPSULE BY MOUTH 3 TIMES A DAY 01/10/24  Katrinka Garnette KIDD, MD  ipratropium-albuterol  (DUONEB) 0.5-2.5 (3) MG/3ML SOLN Take 3 mLs by nebulization every 6 (six) hours as needed. 09/07/23   Jillian Buttery, MD  ondansetron  (ZOFRAN ) 4 MG tablet Take 1 tablet (4 mg total) by mouth every 6 (six) hours as needed for nausea. 09/17/23   Will Almarie MATSU, MD  OXYGEN  Inhale 5 L into the lungs continuous.    [provider]  spironolactone  (ALDACTONE ) 25 MG tablet Take 1 tablet (25 mg total) by mouth daily. 12/25/23 01/24/24  Arrien, Elidia Sieving, MD  traZODone  (DESYREL ) 50 MG tablet Take 1 tablet (50 mg total) by mouth at bedtime. 12/03/23   Katrinka Garnette KIDD, MD    Physical Exam: Vitals:   01/31/24 1015 01/31/24 1200 01/31/24 1315 01/31/24 1417  BP: 106/71 98/62 125/64   Pulse: (!) 114 (!) 54 (!) 108   Resp: 18 (!) 34 (!) 26   Temp:    98 F (36.7 C)  TempSrc:    Oral  SpO2: 95% 95% 98%   Weight:      Height:        Physical Exam Constitutional:      General: He is not in acute distress.    Appearance: Normal appearance.  HENT:     Head: Normocephalic and atraumatic.     Mouth/Throat:     Mouth: Mucous membranes are moist.     Pharynx: Oropharynx is clear.  Eyes:     Extraocular  Movements: Extraocular movements intact.     Pupils: Pupils are equal, round, and reactive to light.  Cardiovascular:     Rate and Rhythm: Normal rate and regular rhythm.     Pulses: Normal pulses.     Heart sounds: Normal heart sounds.  Pulmonary:     Effort: Pulmonary effort is normal. No respiratory distress.     Breath sounds: Wheezing and rales present.  Abdominal:     General: Bowel sounds are normal. There is no distension.     Palpations: Abdomen is soft.     Tenderness: There is no abdominal tenderness.  Musculoskeletal:        General: No swelling or deformity.  Skin:    General: Skin is warm and dry.  Neurological:     General: No focal deficit present.     Mental Status: Mental status is at baseline.    Labs on Admission: I have personally reviewed following labs and imaging studies  CBC: Recent Labs  Lab 01/31/24 0950  WBC 9.3  NEUTROABS 4.7  HGB 14.9  HCT 45.0  MCV 91.5  PLT 290    Basic Metabolic Panel: Recent Labs  Lab 01/31/24 0950  NA 132*  K 3.6  CL 96*  CO2 23  GLUCOSE 129*  BUN 13  CREATININE 1.44*  CALCIUM 8.6*    GFR: Estimated Creatinine Clearance: 49.3 mL/min (A) (by C-G formula based on SCr of 1.44 mg/dL (H)).  Liver Function Tests: Recent Labs  Lab 01/31/24 0950  AST 32  ALT 20  ALKPHOS 68  BILITOT 1.7*  PROT 8.2*  ALBUMIN  3.0*    Urine analysis:    Component Value Date/Time   COLORURINE AMBER (A) 04/20/2019 1438   APPEARANCEUR CLEAR 04/20/2019 1438   LABSPEC 1.017 04/20/2019 1438   PHURINE 5.0 04/20/2019 1438   GLUCOSEU NEGATIVE 04/20/2019 1438   HGBUR MODERATE (A) 04/20/2019 1438   HGBUR 2+ 09/22/2007 1444   BILIRUBINUR NEGATIVE 04/20/2019 1438   BILIRUBINUR n 11/07/2015 1417   KETONESUR NEGATIVE  04/20/2019 1438   PROTEINUR NEGATIVE 04/20/2019 1438   UROBILINOGEN 1.0 11/07/2015 1417   UROBILINOGEN 1.0 05/22/2011 1454   NITRITE NEGATIVE 04/20/2019 1438   LEUKOCYTESUR NEGATIVE 04/20/2019 1438     Radiological Exams on Admission: DG Chest Port 1 View if patient is in a treatment room. Result Date: 01/31/2024 CLINICAL DATA:  Suspected Sepsis EXAM: PORTABLE CHEST 1 VIEW COMPARISON:  Chest x-ray 12/31/2023 FINDINGS: The heart and mediastinal contours are unchanged. Atherosclerotic plaque. Persistent bilateral mid to lower lung zone airspace opacities of the lower lobes right upper lung zone patchy airspace opacity. No pulmonary edema. Persistent bilateral trace to small volume pleural effusions. No pneumothorax. No acute osseous abnormality. IMPRESSION: 1. Persistent bilateral mid to lower lung zone airspace opacities of the lower lobes right upper lung zone patchy airspace opacity. 2. Persistent trace to small volume pleural effusions. 3. Followup PA and lateral chest X-ray is recommended in 3-4 weeks following therapy to ensure resolution and exclude underlying malignancy. Electronically Signed   By: Morgane  Naveau M.D.   On: 01/31/2024 10:29   EKG: Independently reviewed.  Sinus tachycardia at 100 bpm.  PACs and PVCs noted.  Assessment/Plan Active Problems:   Essential hypertension   Paroxysmal atrial fibrillation (HCC)   CKD (chronic kidney disease), stage II   Chronic pain syndrome   History of COPD   Generalized anxiety disorder   Obstructive sleep apnea   Hypothyroidism   Gout   History of prostate cancer-seed implantation 2010.  Dr. Renda   History of renal cell carcinoma   Essential tremor   (HFpEF) heart failure with preserved ejection fraction (HCC)   History of pulmonary embolus (PE)   PTSD (post-traumatic stress disorder)   Post-COVID pulmonary fibrosis (HCC)   ILD (interstitial lung disease) (HCC)   Morbid obesity (HCC)   History of CAD (coronary artery disease)   AKI on CKD 2 > Creatinine elevated to 1.44 from baseline of 1.1. > Weight is similar to his most recent weight check.  Could be some component of overdiuresis following recent admission and continued  home diuretics.  Recent nausea and vomiting could play a role as well. > Received 1 L IV fluids in the ED.  Lactic acid remains mildly elevated at 3.0, 3.2.  Did have a normal lactic acid 4 and 5 months ago. - Continue gentle IV fluids overnight - Trend renal function and electrolytes - Continue to trend lactic acid  COVID-19 pneumonia, presumed > Presumed COVID-19 pneumonia with new productive cough and positive COVID test in the ED.  Patient reported positive flu test at home but this has been negative here. > Was treated for atypical pneumonia during admission last month but COVID was not tested at that time.  No leukocytosis last admission nor in the ED today. > Received ceftriaxone  azithromycin  to cover for possible bacterial pneumonia. - Monitoring on progressive unit given chronic respiratory failure requiring 5L at risk for increased requirement. - Hold off on further antibiotics for now - Check procalcitonin - As needed DuoNebs  Hypertension - Holding Lasix , spironolactone  in the setting of AKI  Paroxysmal atrial fibrillation > Appears to be in sinus rhythm with multiple PACs and PVCs in the ED. - Not on anticoagulation - Not on rate or rhythm control medication  COPD ILD Post COVID pulmonary fibrosis - Continue with home as needed DuoNebs  Diastolic CHF > Last echo was in June of this year with EF 55.3%, G1 DD, normal RV function. > Possible that overdiuresis is contributing  to AKI. - Holding diuretics - Gentle IV fluids as above  PTSD Anxiety - Continue home BuSpar , Lexapro , trazodone   History of PE - No longer on anticoagulation  OSA - Not on CPAP  Obesity - Noted  History of prostate cancer - Status post seed implantation  History of renal cell carcinoma - Status post nephrectomy   DVT prophylaxis: Lovenox  Code Status:   Full Family Communication:  Called wife, Ezzie, by phone and gave her an update Disposition Plan:   Patient is  from:  Home  Anticipated DC to:  Home  Anticipated DC date:  1 to 4 days  Anticipated DC barriers: None  Consults called:  None Admission status:  Observation, progressive  Severity of Illness: The appropriate patient status for this patient is OBSERVATION. Observation status is judged to be reasonable and necessary in order to provide the required intensity of service to ensure the patient's safety. The patient's presenting symptoms, physical exam findings, and initial radiographic and laboratory data in the context of their medical condition is felt to place them at decreased risk for further clinical deterioration. Furthermore, it is anticipated that the patient will be medically stable for discharge from the hospital within 2 midnights of admission.    Marsa KATHEE Scurry MD [facility]  How to contact the [facility] Attending or Consulting provider 7A - 7P or covering provider during after hours 7P -7A, for this patient?   Check the care team in [facility] and look for a) attending/consulting [facility] provider listed and b) the [facility] team listed Log into [email or web address] and use [facility]'s universal password to access. If you do not have the password, please contact the hospital operator. Locate the [facility] provider you are looking for under [facility] and page to a number that you can be directly reached. If you still have difficulty reaching the provider, please page the [facility] (Director on Call) for the Hospitalists listed on amion for assistance.  01/31/2024, 2:32 PM

## 2024-02-01 DIAGNOSIS — F4310 Post-traumatic stress disorder, unspecified: Secondary | ICD-10-CM | POA: Diagnosis present

## 2024-02-01 DIAGNOSIS — J9621 Acute and chronic respiratory failure with hypoxia: Secondary | ICD-10-CM | POA: Diagnosis present

## 2024-02-01 DIAGNOSIS — I480 Paroxysmal atrial fibrillation: Secondary | ICD-10-CM | POA: Diagnosis not present

## 2024-02-01 DIAGNOSIS — E039 Hypothyroidism, unspecified: Secondary | ICD-10-CM | POA: Diagnosis present

## 2024-02-01 DIAGNOSIS — E871 Hypo-osmolality and hyponatremia: Secondary | ICD-10-CM | POA: Diagnosis present

## 2024-02-01 DIAGNOSIS — Z7984 Long term (current) use of oral hypoglycemic drugs: Secondary | ICD-10-CM | POA: Diagnosis not present

## 2024-02-01 DIAGNOSIS — N182 Chronic kidney disease, stage 2 (mild): Secondary | ICD-10-CM | POA: Diagnosis not present

## 2024-02-01 DIAGNOSIS — J440 Chronic obstructive pulmonary disease with acute lower respiratory infection: Secondary | ICD-10-CM | POA: Diagnosis present

## 2024-02-01 DIAGNOSIS — Z86711 Personal history of pulmonary embolism: Secondary | ICD-10-CM | POA: Diagnosis not present

## 2024-02-01 DIAGNOSIS — M7989 Other specified soft tissue disorders: Secondary | ICD-10-CM | POA: Diagnosis not present

## 2024-02-01 DIAGNOSIS — I5032 Chronic diastolic (congestive) heart failure: Secondary | ICD-10-CM | POA: Diagnosis present

## 2024-02-01 DIAGNOSIS — E66811 Obesity, class 1: Secondary | ICD-10-CM | POA: Diagnosis present

## 2024-02-01 DIAGNOSIS — L89323 Pressure ulcer of left buttock, stage 3: Secondary | ICD-10-CM | POA: Diagnosis present

## 2024-02-01 DIAGNOSIS — U071 COVID-19: Secondary | ICD-10-CM | POA: Diagnosis present

## 2024-02-01 DIAGNOSIS — Z6831 Body mass index (BMI) 31.0-31.9, adult: Secondary | ICD-10-CM | POA: Diagnosis not present

## 2024-02-01 DIAGNOSIS — J8410 Pulmonary fibrosis, unspecified: Secondary | ICD-10-CM | POA: Diagnosis present

## 2024-02-01 DIAGNOSIS — I2510 Atherosclerotic heart disease of native coronary artery without angina pectoris: Secondary | ICD-10-CM | POA: Diagnosis present

## 2024-02-01 DIAGNOSIS — I130 Hypertensive heart and chronic kidney disease with heart failure and stage 1 through stage 4 chronic kidney disease, or unspecified chronic kidney disease: Secondary | ICD-10-CM | POA: Diagnosis present

## 2024-02-01 DIAGNOSIS — J1282 Pneumonia due to coronavirus disease 2019: Secondary | ICD-10-CM | POA: Diagnosis present

## 2024-02-01 DIAGNOSIS — N179 Acute kidney failure, unspecified: Secondary | ICD-10-CM | POA: Diagnosis not present

## 2024-02-01 DIAGNOSIS — Z96653 Presence of artificial knee joint, bilateral: Secondary | ICD-10-CM | POA: Diagnosis present

## 2024-02-01 DIAGNOSIS — Z905 Acquired absence of kidney: Secondary | ICD-10-CM | POA: Diagnosis not present

## 2024-02-01 DIAGNOSIS — G894 Chronic pain syndrome: Secondary | ICD-10-CM | POA: Diagnosis present

## 2024-02-01 DIAGNOSIS — M109 Gout, unspecified: Secondary | ICD-10-CM | POA: Diagnosis present

## 2024-02-01 DIAGNOSIS — F411 Generalized anxiety disorder: Secondary | ICD-10-CM | POA: Diagnosis present

## 2024-02-01 LAB — COMPREHENSIVE METABOLIC PANEL WITH GFR
ALT: 18 U/L (ref 0–44)
AST: 23 U/L (ref 15–41)
Albumin: 2.6 g/dL — ABNORMAL LOW (ref 3.5–5.0)
Alkaline Phosphatase: 55 U/L (ref 38–126)
Anion gap: 6 (ref 5–15)
BUN: 14 mg/dL (ref 8–23)
CO2: 26 mmol/L (ref 22–32)
Calcium: 8.4 mg/dL — ABNORMAL LOW (ref 8.9–10.3)
Chloride: 101 mmol/L (ref 98–111)
Creatinine, Ser: 1.11 mg/dL (ref 0.61–1.24)
GFR, Estimated: >60 mL/min (ref >60)
Glucose, Bld: 146 mg/dL — ABNORMAL HIGH (ref 70–99)
Potassium: 4.1 mmol/L (ref 3.5–5.1)
Sodium: 133 mmol/L — ABNORMAL LOW (ref 135–145)
Total Bilirubin: 0.4 mg/dL (ref 0.0–1.2)
Total Protein: 7.7 g/dL (ref 6.5–8.1)

## 2024-02-01 LAB — CBC
HCT: 39.3 % (ref 39.0–52.0)
Hemoglobin: 13.4 g/dL (ref 13.0–17.0)
MCH: 30.1 pg (ref 26.0–34.0)
MCHC: 34.1 g/dL (ref 30.0–36.0)
MCV: 88.3 fL (ref 80.0–100.0)
Platelets: 246 K/uL (ref 150–400)
RBC: 4.45 MIL/uL (ref 4.22–5.81)
RDW: 13.8 % (ref 11.5–15.5)
WBC: 7 K/uL (ref 4.0–10.5)
nRBC: 0 % (ref 0.0–0.2)

## 2024-02-01 LAB — D-DIMER, QUANTITATIVE: D-Dimer, Quant: 1.67 ug{FEU}/mL — ABNORMAL HIGH (ref 0.00–0.50)

## 2024-02-01 LAB — C-REACTIVE PROTEIN: CRP: 12.6 mg/dL — ABNORMAL HIGH (ref <1.0)

## 2024-02-01 LAB — PROCALCITONIN: Procalcitonin: 0.11 ng/mL

## 2024-02-01 MED ORDER — SODIUM CHLORIDE 0.9 % IV SOLN
100.0000 mg | Freq: Every day | INTRAVENOUS | Status: AC
  Administered 2024-02-02 – 2024-02-03 (×2): 100 mg via INTRAVENOUS
  Filled 2024-02-01 (×2): qty 20

## 2024-02-01 MED ORDER — METHYLPREDNISOLONE SODIUM SUCC 125 MG IJ SOLR
50.0000 mg | Freq: Two times a day (BID) | INTRAMUSCULAR | Status: DC
  Administered 2024-02-01 – 2024-02-03 (×6): 50 mg via INTRAVENOUS
  Filled 2024-02-01 (×7): qty 2

## 2024-02-01 MED ORDER — GUAIFENESIN 100 MG/5ML PO LIQD
5.0000 mL | ORAL | Status: AC | PRN
  Administered 2024-02-01 – 2024-02-05 (×6): 5 mL via ORAL
  Filled 2024-02-01 (×7): qty 5

## 2024-02-01 MED ORDER — BUDESONIDE 0.25 MG/2ML IN SUSP
0.2500 mg | Freq: Two times a day (BID) | RESPIRATORY_TRACT | Status: DC
  Administered 2024-02-01 – 2024-02-02 (×2): 0.25 mg via RESPIRATORY_TRACT
  Filled 2024-02-01 (×2): qty 2

## 2024-02-01 MED ORDER — IPRATROPIUM-ALBUTEROL 0.5-2.5 (3) MG/3ML IN SOLN
3.0000 mL | Freq: Three times a day (TID) | RESPIRATORY_TRACT | Status: DC
  Administered 2024-02-01 – 2024-02-02 (×2): 3 mL via RESPIRATORY_TRACT
  Filled 2024-02-01 (×2): qty 3

## 2024-02-01 MED ORDER — SODIUM CHLORIDE 0.9 % IV SOLN
200.0000 mg | Freq: Once | INTRAVENOUS | Status: AC
  Administered 2024-02-01: 200 mg via INTRAVENOUS
  Filled 2024-02-01: qty 40

## 2024-02-01 MED ORDER — IPRATROPIUM-ALBUTEROL 0.5-2.5 (3) MG/3ML IN SOLN: 3.0000 mL | Freq: Three times a day (TID) | RESPIRATORY_TRACT | Status: DC

## 2024-02-01 NOTE — Progress Notes (Signed)
  This pt is currently active with [facility] and home based Palliative Care program provided by [facility]. We are following while in hospital. He is eligible to be d/c back with PC services at home or if alternate d/c plan needed at d/c will be happy to assist as well    Madelin Gander RN [phone]

## 2024-02-01 NOTE — Progress Notes (Addendum)
 PROGRESS NOTE        PATIENT DETAILS Name: Wayne Booth Age: 83 y.o. Sex: male Date of Birth: [birth year] Admit Date: 01/31/2024 Admitting Physician Donalda CHRISTELLA Applebaum, MD ERE:Ylwuzm, Garnette KIDD, MD  Brief Summary: Patient is a 83 y.o.  male with history of HTN, chronic HFpEF, ILD on 5 L of oxygen  at home, essential tremor, renal cell carcinoma s/p left nephrectomy-present with several days history of weakness/cough/shortness of breath-found to have presumed COVID-19 infection pneumonia   (wife who this MD spoke to on 8/4-with URI symptoms-presumed that she had the flu-but has never been tested)  Significant events: 8/4>> admit to [facility]  Significant studies: 8/4>> CXR: Bilateral patchy airspace opacities.  Significant microbiology data: 8/4>> blood cultures: No growth 8/4>> COVID PCR: Positive (CT value 15.7) 8/4>> influenza/RSV PCR: Negative  Procedures: None  Consults: None  Subjective: Stable on 5 L-no major issues overnight.  Appears unchanged.  Continues to complain of weakness.  Objective: Vitals: Blood pressure 127/74, pulse 73, temperature (!) 97.5 F (36.4 C), temperature source Oral, resp. rate 17, height 5' 11 (1.803 m), weight 103.8 kg, SpO2 95%.   Exam: Gen Exam:Alert awake-not in any distress HEENT:atraumatic, normocephalic Chest: Bibasilar rales. CVS:S1S2 regular Abdomen:soft non tender, non distended Extremities:no edema Neurology: Non focal Skin: no rash  Pertinent Labs/Radiology:    Latest Ref Rng & Units 02/01/2024    6:06 AM 01/31/2024    2:23 PM 01/31/2024    9:50 AM  CBC  WBC 4.0 - 10.5 K/uL 7.0   9.3   Hemoglobin 13.0 - 17.0 g/dL 86.5  84.9  85.0   Hematocrit 39.0 - 52.0 % 39.3  44.0  45.0   Platelets 150 - 400 K/uL 246   290     Lab Results  Component Value Date   NA 133 (L) 02/01/2024   K 4.1 02/01/2024   CL 101 02/01/2024   CO2 26 02/01/2024      Assessment/Plan: COVID 19 pneumonia Start  Remdesivir  Start IV Solu-Medrol  Await CRP/Doppler Currently comfortable at baseline 5 L of oxygen .  If hypoxia worsens-will treat with Actemra. Numerous comorbidities including ILD/chronic hypoxia-puts him at high risk for severe disease.  Interstitial lung disease COPD Chronic hypoxic respiratory failure on 5 L of oxygen  at baseline Continue bronchodilators and supportive care.  Chronic HFpEF Euvolemic on exam As needed diuretics-try to keep in negative balance.  AKI on CKD 2 AKI likely hemodynamically mediated Resolved with supportive care.  PAF Maintaining sinus rhythm Per prior discharge summary on 6/27-patient has declined anticoagulation in the past.  History of pulmonary embolism On prophylactic Lovenox  currently Apparently completed several months of anticoagulation in the past Checking D-dimer given history of COVID-19 infection.  OSA Not on CPAP  History of renal cell carcinoma-s/p left nephrectomy Monitor renal function  History of prostate cancer s/p seed implantation.  History of PTSD/anxiety Stable BuSpar /Lexapro /trazodone .  Class 1 Obesity: Estimated body mass index is 31.92 kg/m as calculated from the following:   Height as of this encounter: 5' 11 (1.803 m).   Weight as of this encounter: 103.8 kg.   Code status:   Code Status: Full Code   DVT Prophylaxis:enoxaparin  (LOVENOX ) injection 40 mg Start: 01/31/24 1600    Family Communication: Spouse-Jody [phone] updated 8/5   Disposition Plan: Status is: Observation The patient will require care spanning > 2 midnights and  should be moved to inpatient because: Severe bradycardia of illness   Planned Discharge Destination:Home health   Diet: Diet Order             Diet regular Room service appropriate? Yes; Fluid consistency: Thin  Diet effective now                     Antimicrobial agents: Anti-infectives (From admission, onward)    Start     Dose/Rate Route  Frequency Ordered Stop   02/02/24 1000  remdesivir  100 mg in sodium chloride  0.9 % 100 mL IVPB       Placed in Followed by Linked Group   100 mg 200 mL/hr over 30 Minutes Intravenous Daily 02/01/24 0910 02/04/24 0959   02/01/24 1015  remdesivir  200 mg in sodium chloride  0.9% 250 mL IVPB       Placed in Followed by Linked Group   200 mg 580 mL/hr over 30 Minutes Intravenous Once 02/01/24 0910     01/31/24 1345  azithromycin  (ZITHROMAX ) 500 mg in sodium chloride  0.9 % 250 mL IVPB        500 mg 250 mL/hr over 60 Minutes Intravenous  Once 01/31/24 1332 01/31/24 1718   01/31/24 1345  cefTRIAXone  (ROCEPHIN ) 2 g in sodium chloride  0.9 % 100 mL IVPB        2 g 200 mL/hr over 30 Minutes Intravenous  Once 01/31/24 1332 01/31/24 1504        MEDICATIONS: Scheduled Meds:  enoxaparin  (LOVENOX ) injection  40 mg Subcutaneous Q24H   escitalopram   20 mg Oral Daily   gabapentin   300 mg Oral TID   methylPREDNISolone  (SOLU-MEDROL ) injection  50 mg Intravenous Q12H   sodium chloride  flush  3 mL Intravenous Q12H   traZODone   50 mg Oral QHS   Continuous Infusions:  sodium chloride  50 mL/hr at 02/01/24 9352   remdesivir  200 mg in sodium chloride  0.9% 250 mL IVPB     Followed by   NOREEN ON 02/02/2024] remdesivir  100 mg in sodium chloride  0.9 % 100 mL IVPB     PRN Meds:.acetaminophen  **OR** acetaminophen , busPIRone , ipratropium-albuterol , polyethylene glycol   I have personally reviewed following labs and imaging studies  LABORATORY DATA: CBC: Recent Labs  Lab 01/31/24 0950 01/31/24 1423 02/01/24 0606  WBC 9.3  --  7.0  NEUTROABS 4.7  --   --   HGB 14.9 15.0 13.4  HCT 45.0 44.0 39.3  MCV 91.5  --  88.3  PLT 290  --  246    Basic Metabolic Panel: Recent Labs  Lab 01/31/24 0950 01/31/24 1423 02/01/24 0606  NA 132* 132* 133*  K 3.6 3.5 4.1  CL 96*  --  101  CO2 23  --  26  GLUCOSE 129*  --  146*  BUN 13  --  14  CREATININE 1.44*  --  1.11  CALCIUM 8.6*  --  8.4*     GFR: Estimated Creatinine Clearance: 64 mL/min (by C-G formula based on SCr of 1.11 mg/dL).  Liver Function Tests: Recent Labs  Lab 01/31/24 0950 02/01/24 0606  AST 32 23  ALT 20 18  ALKPHOS 68 55  BILITOT 1.7* 0.4  PROT 8.2* 7.7  ALBUMIN  3.0* 2.6*   No results for input(s): LIPASE, AMYLASE in the last 168 hours. No results for input(s): AMMONIA in the last 168 hours.  Coagulation Profile: Recent Labs  Lab 01/31/24 0950  INR 1.2    Cardiac Enzymes: No results for input(s):  CKTOTAL, CKMB, CKMBINDEX, TROPONINI in the last 168 hours.  BNP (last 3 results) No results for input(s): PROBNP in the last 8760 hours.  Lipid Profile: No results for input(s): CHOL, HDL, LDLCALC, TRIG, CHOLHDL, LDLDIRECT in the last 72 hours.  Thyroid  Function Tests: No results for input(s): TSH, T4TOTAL, FREET4, T3FREE, THYROIDAB in the last 72 hours.  Anemia Panel: No results for input(s): VITAMINB12, FOLATE, FERRITIN, TIBC, IRON, RETICCTPCT in the last 72 hours.  Urine analysis:    Component Value Date/Time   COLORURINE AMBER (A) 01/31/2024 0950   APPEARANCEUR HAZY (A) 01/31/2024 0950   LABSPEC 1.016 01/31/2024 0950   PHURINE 5.0 01/31/2024 0950   GLUCOSEU NEGATIVE 01/31/2024 0950   HGBUR LARGE (A) 01/31/2024 0950   HGBUR 2+ 09/22/2007 1444   BILIRUBINUR NEGATIVE 01/31/2024 0950   BILIRUBINUR n 11/07/2015 1417   KETONESUR NEGATIVE 01/31/2024 0950   PROTEINUR 30 (A) 01/31/2024 0950   UROBILINOGEN 1.0 11/07/2015 1417   UROBILINOGEN 1.0 05/22/2011 1454   NITRITE NEGATIVE 01/31/2024 0950   LEUKOCYTESUR NEGATIVE 01/31/2024 0950    Sepsis Labs: Lactic Acid, Venous    Component Value Date/Time   LATICACIDVEN 1.8 01/31/2024 2030    MICROBIOLOGY: Recent Results (from the past 240 hours)  Culture, blood (Routine x 2)     Status: None (Preliminary result)   Collection Time: 01/31/24  9:45 AM   Specimen: BLOOD  Result Value Ref  Range Status   Specimen Description BLOOD SITE NOT SPECIFIED  Final   Special Requests   Final    BOTTLES DRAWN AEROBIC AND ANAEROBIC Blood Culture results may not be optimal due to an inadequate volume of blood received in culture bottles   Culture   Final    NO GROWTH < 24 HOURS Performed at [facility], [address], [place], [place] [address]    Report Status PENDING  Incomplete  Culture, blood (Routine x 2)     Status: None (Preliminary result)   Collection Time: 01/31/24  9:50 AM   Specimen: BLOOD  Result Value Ref Range Status   Specimen Description BLOOD SITE NOT SPECIFIED  Final   Special Requests   Final    BOTTLES DRAWN AEROBIC AND ANAEROBIC Blood Culture adequate volume   Culture   Final    NO GROWTH < 24 HOURS Performed at [facility], [address], [place], [place] [address]    Report Status PENDING  Incomplete  Resp panel by RT-PCR (RSV, Flu A&B, Covid) Anterior Nasal Swab     Status: Abnormal   Collection Time: 01/31/24 10:08 AM   Specimen: Anterior Nasal Swab  Result Value Ref Range Status   SARS Coronavirus 2 by RT PCR POSITIVE (A) NEGATIVE Final   Influenza A by PCR NEGATIVE NEGATIVE Final   Influenza B by PCR NEGATIVE NEGATIVE Final    Comment: (NOTE) The Xpert Xpress SARS-CoV-2/FLU/RSV plus assay is intended as an aid in the diagnosis of influenza from Nasopharyngeal swab specimens and should not be used as a sole basis for treatment. Nasal washings and aspirates are unacceptable for Xpert Xpress SARS-CoV-2/FLU/RSV testing.  Fact Sheet for Patients: BloggerCourse.com  Fact Sheet for Healthcare Providers: SeriousBroker.it  This test is not yet approved or cleared by the [place]  FDA and has been authorized for detection and/or diagnosis of SARS-CoV-2 by FDA under an Emergency Use Authorization (EUA). This EUA will remain in effect (meaning this test can be used) for the duration of  the COVID-19 declaration under Section  564(b)(1) of the Act, 21 U.S.C. section 360bbb-3(b)(1), unless the authorization is terminated or revoked.     Resp Syncytial Virus by PCR NEGATIVE NEGATIVE Final    Comment: (NOTE) Fact Sheet for Patients: BloggerCourse.com  Fact Sheet for Healthcare Providers: SeriousBroker.it  This test is not yet approved or cleared by the [place]  FDA and has been authorized for detection and/or diagnosis of SARS-CoV-2 by FDA under an Emergency Use Authorization (EUA). This EUA will remain in effect (meaning this test can be used) for the duration of the COVID-19 declaration under Section 564(b)(1) of the Act, 21 U.S.C. section 360bbb-3(b)(1), unless the authorization is terminated or revoked.  Performed at [facility], [address], [place], [place] [address]     RADIOLOGY STUDIES/RESULTS: DG Chest Port 1 View if patient is in a treatment room. Result Date: 01/31/2024 CLINICAL DATA:  Suspected Sepsis EXAM: PORTABLE CHEST 1 VIEW COMPARISON:  Chest x-ray 12/31/2023 FINDINGS: The heart and mediastinal contours are unchanged. Atherosclerotic plaque. Persistent bilateral mid to lower lung zone airspace opacities of the lower lobes right upper lung zone patchy airspace opacity. No pulmonary edema. Persistent bilateral trace to small volume pleural effusions. No pneumothorax. No acute osseous abnormality. IMPRESSION: 1. Persistent bilateral mid to lower lung zone airspace opacities of the lower lobes right upper lung zone patchy airspace opacity. 2. Persistent trace to small volume pleural effusions. 3. Followup PA and lateral chest X-ray is recommended in 3-4 weeks following therapy to ensure resolution and exclude underlying malignancy. Electronically Signed   By: Morgane  Naveau M.D.   On: 01/31/2024 10:29     LOS: 0 days   Donalda Applebaum, MD  [facility]    To contact the attending  provider between 7A-7P or the covering provider during after hours 7P-7A, please log into the web site [email or web address] and access using universal [place] password for that web site. If you do not have the password, please call the hospital operator.  02/01/2024, 11:14 AM

## 2024-02-01 NOTE — Care Management Obs Status (Signed)
 MEDICARE OBSERVATION STATUS NOTIFICATION   Patient Details  Name: Wayne Booth MRN: [id number] Date of Birth: 16-Jul-1940   Medicare Observation Status Notification Given:  Yes Obs letter explained and a copy given    Claretta Deed 02/01/2024, 11:20 AM

## 2024-02-01 NOTE — Plan of Care (Signed)

## 2024-02-01 NOTE — Plan of Care (Signed)
  Problem: Education: Goal: Knowledge of risk factors and measures for prevention of condition will improve Outcome: Progressing   Problem: Respiratory: Goal: Will maintain a patent airway Outcome: Progressing   Problem: Clinical Measurements: Goal: Respiratory complications will improve Outcome: Progressing   Problem: Activity: Goal: Risk for activity intolerance will decrease Outcome: Progressing   Problem: Nutrition: Goal: Adequate nutrition will be maintained Outcome: Progressing

## 2024-02-01 NOTE — TOC Initial Note (Signed)
 Transition of Care [facility]) - Initial/Assessment Note    Patient Details  Name: Wayne Booth MRN: [id number] Date of Birth: 07-05-1940  Transition of Care [facility]) CM/SW Contact:    Marval Gell, RN Phone Number: 02/01/2024, 8:21 AM  Clinical Narrative:                  Per chart review Patient from home w wife. In obs for COVID PNA, AKI.  5L O2 baseline.  Bamboo shows patient is active w [facility] services.  ICM will continue to follow for DC planning  Expected Discharge Plan: Home w Home Health Services Barriers to Discharge: Continued Medical Work up   Patient Goals and CMS Choice            Expected Discharge Plan and Services   Discharge Planning Services: CM Consult   Living arrangements for the past 2 months: Single Family Home                                      Prior Living Arrangements/Services Living arrangements for the past 2 months: Single Family Home Lives with:: Spouse                   Activities of Daily Living   ADL Screening (condition at time of admission) Independently performs ADLs?: Yes (appropriate for developmental age) Is the patient deaf or have difficulty hearing?: No Does the patient have difficulty seeing, even when wearing glasses/contacts?: No Does the patient have difficulty concentrating, remembering, or making decisions?: No  Permission Sought/Granted                  Emotional Assessment              Admission diagnosis:  AKI (acute kidney injury) (HCC) [N17.9] Acute renal failure superimposed on stage 2 chronic kidney disease (HCC) [N17.9, N18.2] SARS-CoV-2 positive [U07.1] Patient Active Problem List   Diagnosis Date Noted   Pneumonia due to COVID-19 virus 01/31/2024   Acute renal failure superimposed on stage 2 chronic kidney disease (HCC) 01/31/2024   Morbid obesity (HCC) 12/31/2023   History of CAD (coronary artery disease) 12/31/2023   Paroxysmal atrial fibrillation (HCC)  12/20/2023   History of COPD 12/20/2023   COPD (chronic obstructive pulmonary disease) (HCC) 08/29/2023   Palliative care patient 10/26/2022   Conjunctivitis 10/12/2022   Physical deconditioning 07/17/2022   Pharmacologic therapy 01/26/2021   Disorder of skeletal system 01/26/2021   Problems influencing health status 01/26/2021   ILD (interstitial lung disease) (HCC) 10/13/2019   Nodule of right lung 10/13/2019   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   PTSD (post-traumatic stress disorder) 07/27/2018   Memory loss 06/10/2018   Aortic atherosclerosis (HCC) 05/10/2018   Chronic hypoxic respiratory failure (HCC) 04/26/2018   First degree AV block 04/15/2018   Hyponatremia 04/15/2018   History of pulmonary embolus (PE) 04/15/2018   Peripheral neuropathy 04/14/2018   CAD (coronary artery disease) 04/09/2018   Chronic pain syndrome 04/09/2018   CKD (chronic kidney disease), stage II    Dyspnea on exertion    Constipation 01/21/2018   (HFpEF) heart failure with preserved ejection fraction (HCC) 12/16/2017   Chest pain    Pain in left knee 07/15/2017   Bilateral foot pain 07/13/2017   Status post nephrectomy 06/09/2017   VTE (venous thromboembolism) 06/09/2017   History of adenomatous polyp of colon 04/12/2017   DDD (  degenerative disc disease), cervical 11/13/2015   Essential tremor 08/14/2015   Gout 08/14/2015   History of fusion of cervical spine 04/24/2015   Hypothyroidism 07/12/2014   Hyperglycemia 07/12/2014   Generalized anxiety disorder 11/07/2013   Obstructive sleep apnea 07/07/2011   Asthmatic bronchitis 06/09/2011   History of total knee replacement, 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  Dr. Renda 10/02/2008   History of renal cell carcinoma 12/26/2007   Insomnia 12/26/2007   TESTOSTERONE  DEFICIENCY 12/28/2006   Essential hypertension 12/28/2006   History of total knee replacement, left 10/28/2006    PCP:  Katrinka Garnette KIDD, MD Pharmacy:   [facility] [id number] - [address], [place] - [address] [place] [address] Phone: [phone] Fax: [phone]  [facility] [address] [place] [address] Phone: [phone] Fax: [phone]     Social Drivers of Health (SDOH) Social History: SDOH Screenings   Food Insecurity: No Food Insecurity (02/01/2024)  Housing: Low Risk  (02/01/2024)  Transportation Needs: No Transportation Needs (02/01/2024)  Utilities: Not At Risk (02/01/2024)  Alcohol  Screen: Low Risk  (01/17/2024)  Depression (PHQ2-9): Low Risk  (01/17/2024)  Recent Concern: Depression (PHQ2-9) - High Risk (01/03/2024)  Financial Resource Strain: Low Risk  (01/17/2024)  Physical Activity: Sufficiently Active (01/17/2024)  Social Connections: Moderately Integrated (02/01/2024)  Stress: No Stress Concern Present (01/17/2024)  Tobacco Use: Low Risk  (01/31/2024)  Health Literacy: Adequate Health Literacy (01/17/2024)   SDOH Interventions:     Readmission Risk Interventions    12/22/2023    5:01 PM 10/20/2023   11:54 AM  Readmission Risk Prevention Plan  Transportation Screening Complete Complete  PCP or Specialist Appt within 5-7 Days  Complete  PCP or Specialist Appt within 3-5 Days Complete   Home Care Screening  Complete  Medication Review (RN CM)  Complete  HRI or Home Care Consult Complete   Palliative Care Screening Not Applicable   Medication Review (RN Care Manager) Complete

## 2024-02-02 ENCOUNTER — Inpatient Hospital Stay (HOSPITAL_COMMUNITY)

## 2024-02-02 DIAGNOSIS — N179 Acute kidney failure, unspecified: Secondary | ICD-10-CM | POA: Diagnosis not present

## 2024-02-02 DIAGNOSIS — M7989 Other specified soft tissue disorders: Secondary | ICD-10-CM | POA: Diagnosis not present

## 2024-02-02 DIAGNOSIS — I480 Paroxysmal atrial fibrillation: Secondary | ICD-10-CM | POA: Diagnosis not present

## 2024-02-02 DIAGNOSIS — N182 Chronic kidney disease, stage 2 (mild): Secondary | ICD-10-CM | POA: Diagnosis not present

## 2024-02-02 DIAGNOSIS — U071 COVID-19: Secondary | ICD-10-CM | POA: Diagnosis not present

## 2024-02-02 LAB — CBC
HCT: 38 % — ABNORMAL LOW (ref 39.0–52.0)
Hemoglobin: 12.9 g/dL — ABNORMAL LOW (ref 13.0–17.0)
MCH: 30.6 pg (ref 26.0–34.0)
MCHC: 33.9 g/dL (ref 30.0–36.0)
MCV: 90 fL (ref 80.0–100.0)
Platelets: 259 K/uL (ref 150–400)
RBC: 4.22 MIL/uL (ref 4.22–5.81)
RDW: 14.1 % (ref 11.5–15.5)
WBC: 10.7 K/uL — ABNORMAL HIGH (ref 4.0–10.5)
nRBC: 0 % (ref 0.0–0.2)

## 2024-02-02 LAB — COMPREHENSIVE METABOLIC PANEL WITH GFR
ALT: 18 U/L (ref 0–44)
AST: 24 U/L (ref 15–41)
Albumin: 2.5 g/dL — ABNORMAL LOW (ref 3.5–5.0)
Alkaline Phosphatase: 49 U/L (ref 38–126)
Anion gap: 10 (ref 5–15)
BUN: 19 mg/dL (ref 8–23)
CO2: 24 mmol/L (ref 22–32)
Calcium: 8.3 mg/dL — ABNORMAL LOW (ref 8.9–10.3)
Chloride: 99 mmol/L (ref 98–111)
Creatinine, Ser: 1.07 mg/dL (ref 0.61–1.24)
GFR, Estimated: >60 mL/min (ref >60)
Glucose, Bld: 200 mg/dL — ABNORMAL HIGH (ref 70–99)
Potassium: 3.8 mmol/L (ref 3.5–5.1)
Sodium: 133 mmol/L — ABNORMAL LOW (ref 135–145)
Total Bilirubin: 0.5 mg/dL (ref 0.0–1.2)
Total Protein: 7.2 g/dL (ref 6.5–8.1)

## 2024-02-02 LAB — C-REACTIVE PROTEIN: CRP: 7.6 mg/dL — ABNORMAL HIGH (ref <1.0)

## 2024-02-02 LAB — D-DIMER, QUANTITATIVE: D-Dimer, Quant: 1.28 ug{FEU}/mL — ABNORMAL HIGH (ref 0.00–0.50)

## 2024-02-02 MED ORDER — BUDESONIDE 0.25 MG/2ML IN SUSP
0.2500 mg | Freq: Two times a day (BID) | RESPIRATORY_TRACT | Status: DC
  Administered 2024-02-02 – 2024-02-06 (×6): 0.25 mg via RESPIRATORY_TRACT
  Filled 2024-02-02 (×8): qty 2

## 2024-02-02 NOTE — Progress Notes (Signed)
 Tele keeps calling about patient's heart rate  going up to the 160's  and short runs of SVT. Patient with normal heart rate when assessed at bedside. He does suffer from tremors. Notified Loralee Cleaver, MD. Initially order for STAT EKG was ordered but on assessment by the MD, it was discontinued.

## 2024-02-02 NOTE — Evaluation (Signed)
 Occupational Therapy Evaluation Patient Details Name: Wayne Booth MRN: [id number] DOB: 24-May-1941 Today's Date: 02/02/2024   History of Present Illness   83 y.o. 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  Pt admitted on 01/31/24 with COVID 19 PNA. Pt with admission last month to [facility] with CHF exacerbation.     Clinical Impressions Pt was ambulating and completing basic ADLs independently prior to admission. He was participating in [facility] therapy and reports he was continuing to struggle with decreased activity tolerance since admission last month. Pt presents with baseline tremor, generalized weakness and impaired standing balance with LOB upon standing when asked to march in place. With RW, pt requires CGA for mobility. He needs set up to min assist for ADLs. Pt sits to shower at home and does not prefer to wear socks. SpO2 noted to drop to 87% with standing activities with inconsistent pleth, 92% on 5L at rest. Will follow acutely.      If plan is discharge home, recommend the following:   A little help with walking and/or transfers;A little help with bathing/dressing/bathroom;Assistance with cooking/housework;Help with stairs or ramp for entrance;Assist for transportation     Functional Status Assessment   Patient has had a recent decline in their functional status and demonstrates the ability to make significant improvements in function in a reasonable and predictable amount of time.     Equipment Recommendations   None recommended by OT     Recommendations for Other Services         Precautions/Restrictions   Precautions Precautions: Fall Recall of Precautions/Restrictions: Impaired Restrictions Weight Bearing Restrictions Per Provider Order: No     Mobility Bed Mobility                General bed mobility comments: pt sleeps in chair    Transfers Overall transfer level: Needs assistance Equipment used: Rolling walker (2 wheels), None Transfers: Sit to/from Stand Sit to Stand: Contact guard assist           General transfer comment: LOB when asked to march in place upon standing without AD      Balance Overall balance assessment: Needs assistance   Sitting balance-Leahy Scale: Good       Standing balance-Leahy Scale: Poor                             ADL either performed or assessed with clinical judgement   ADL Overall ADL's : Needs assistance/impaired Eating/Feeding: Independent;Sitting   Grooming: Set up;Sitting   Upper Body Bathing: Set up;Sitting   Lower Body Bathing: Minimal assistance;Sit to/from stand   Upper Body Dressing : Set up;Sitting   Lower Body Dressing: Minimal assistance;Sit to/from stand   Toilet Transfer: Minimal assistance           Functional mobility during ADLs: Minimal assistance;Rolling walker (2 wheels) General ADL Comments: requested pt use RW after LOB upon standing without AD     Vision Ability to See in Adequate Light: 0 Adequate Patient Visual Report: No change from baseline       Perception         Praxis         Pertinent Vitals/Pain Pain Assessment Pain Assessment: Faces Faces Pain Scale: No hurt     Extremity/Trunk Assessment Upper Extremity  Assessment Upper Extremity Assessment: Overall WFL for tasks assessed   Lower Extremity Assessment Lower Extremity Assessment: Defer to PT evaluation   Cervical / Trunk Assessment Cervical / Trunk Assessment: Kyphotic   Communication Communication Communication: Impaired Factors Affecting Communication: Hearing impaired   Cognition Arousal: Alert Behavior During Therapy: WFL for tasks assessed/performed Cognition: No apparent impairments             OT - Cognition Comments: pt minimizes fall risk                  Following commands: Intact       Cueing  General Comments   Cueing Techniques: Verbal cues      Exercises     Shoulder Instructions      Home Living Family/patient expects to be discharged to:: Private residence Living Arrangements: Spouse/significant other Available Help at Discharge: Family;Available 24 hours/day Type of Home: House Home Access: Level entry     Home Layout: Multi-level;Able to live on main level with bedroom/bathroom Alternate Level Stairs-Number of Steps: 12 Alternate Level Stairs-Rails: Right Bathroom Shower/Tub: Producer, television/film/video: Standard Bathroom Accessibility: Yes How Accessible: Accessible via walker Home Equipment: Shower seat - built Charity fundraiser (2 wheels);Cane - single point;Hand held shower head          Prior Functioning/Environment Prior Level of Function : Independent/Modified Independent;Driving             Mobility Comments: rarely leaves home, was receiving HHPT, reports tiring easily ADLs Comments: sits to shower, does not wear socks, wife does IADLs    OT Problem List: Decreased activity tolerance;Impaired balance (sitting and/or standing);Decreased safety awareness;Decreased knowledge of use of DME or AE;Cardiopulmonary status limiting activity   OT Treatment/Interventions: Self-care/ADL training;DME and/or AE instruction;Energy conservation;Therapeutic activities;Patient/family education;Balance training      OT Goals(Current goals can be found in the care plan section)   Acute Rehab OT Goals OT Goal Formulation: With patient Time For Goal Achievement: 02/16/24 Potential to Achieve Goals: Good ADL Goals Pt Will Perform Grooming: with supervision;standing Pt Will Perform Lower Body Bathing: with supervision;sit to/from stand Pt Will Perform Lower Body Dressing: with supervision Pt Will Transfer to Toilet: with supervision;ambulating Pt Will Perform Toileting - Clothing  Manipulation and hygiene: with supervision;sit to/from stand Additional ADL Goal #1: Pt will generalize energy conservation strategies and pursed lip breathing techniques during ADLs and mobility.   OT Frequency:  Min 2X/week    Co-evaluation              AM-PAC OT 6 Clicks Daily Activity     Outcome Measure Help from another person eating meals?: None Help from another person taking care of personal grooming?: A Little Help from another person toileting, which includes using toliet, bedpan, or urinal?: A Little Help from another person bathing (including washing, rinsing, drying)?: A Little Help from another person to put on and taking off regular upper body clothing?: A Little Help from another person to put on and taking off regular lower body clothing?: A Little 6 Click Score: 19   End of Session Equipment Utilized During Treatment: Rolling walker (2 wheels);Oxygen  (5L) Nurse Communication: Other (comment) (pt wants stool softener)  Activity Tolerance: Patient limited by fatigue Patient left: in chair;with call bell/phone within reach;Other (comment) (with PT present)  OT Visit Diagnosis: Unsteadiness on feet (R26.81);Other abnormalities of gait and mobility (R26.89);Other (comment) (decreased activity tolerance)  Time: 8498-8476 OT Time Calculation (min): 22 min Charges:  OT General Charges $OT Visit: 1 Visit OT Evaluation $OT Eval Low Complexity: 1 Low  Wayne Booth, OTR/L Acute Rehabilitation Services Office: [phone]   Wayne Booth 02/02/2024, 3:50 PM

## 2024-02-02 NOTE — Progress Notes (Signed)
 Venous duplex lower ext  has been completed. Refer to [facility] under chart review to view preliminary results.   02/02/2024  4:27 PM [name], Ricka BIRCH

## 2024-02-02 NOTE — Progress Notes (Signed)
 PROGRESS NOTE        PATIENT DETAILS Name: Wayne Booth Age: 83 y.o. Sex: male Date of Birth: July 02, 1940 Admit Date: 01/31/2024 Admitting Physician Donalda CHRISTELLA Applebaum, MD ERE:Ylwuzm, Garnette KIDD, MD  Brief Summary: Patient is a 83 y.o.  male with history of HTN, chronic HFpEF, ILD on 5 L of oxygen  at home, essential tremor, renal cell carcinoma s/p left nephrectomy-present with several days history of weakness/cough/shortness of breath-found to have presumed COVID-19 infection pneumonia   (wife who this MD spoke to on 8/4-with URI symptoms-presumed that she had the flu-but has never been tested)  Significant events: 8/4>> admit to [facility]  Significant studies: 8/4>> CXR: Bilateral patchy airspace opacities.  Significant microbiology data: 8/4>> blood cultures: No growth 8/4>> COVID PCR: Positive (CT value 15.7) 8/4>> influenza/RSV PCR: Negative  Procedures: None  Consults: None  Subjective: Feels that her-stable on 4-5 L of oxygen .  Although better-not yet back to baseline.  Objective: Vitals: Blood pressure 120/75, pulse 97, temperature (!) 97.2 F (36.2 C), temperature source Oral, resp. rate 18, height 5' 11 (1.803 m), weight 103.8 kg, SpO2 96%.   Exam: Awake/alert Chest: Clear to auscultation CVS: S1-S2 regular Abdomen: Soft nontender nondistended Extremities: No edema Nonfocal exam.  Pertinent Labs/Radiology:    Latest Ref Rng & Units 02/02/2024    6:07 AM 02/01/2024    6:06 AM 01/31/2024    2:23 PM  CBC  WBC 4.0 - 10.5 K/uL 10.7  7.0    Hemoglobin 13.0 - 17.0 g/dL 87.0  86.5  84.9   Hematocrit 39.0 - 52.0 % 38.0  39.3  44.0   Platelets 150 - 400 K/uL 259  246      Lab Results  Component Value Date   NA 133 (L) 02/02/2024   K 3.8 02/02/2024   CL 99 02/02/2024   CO2 24 02/02/2024      Assessment/Plan: COVID 19 pneumonia Overall stable-slightly improved Continue IV Solu-Medrol /Remdesivir  Stable on 4-5 L of oxygen  which is  baseline. Volume status stable-does not require diuretics.  Interstitial lung disease COPD Chronic hypoxic respiratory failure on 5 L of oxygen  at baseline Continue bronchodilators and supportive care.  Chronic HFpEF Euvolemic on exam As needed diuretics-try to keep in negative balance.  AKI on CKD 2 AKI likely hemodynamically mediated Resolved with supportive care.  PAF Maintaining sinus rhythm Per prior discharge summary on 6/27-patient has declined anticoagulation in the past.  History of pulmonary embolism On prophylactic Lovenox  currently Apparently completed several months of anticoagulation in the past D-dimer minimally elevated-doubt VTE-checking Doppler in any event given history of COVID-19 infection.  OSA Not on CPAP  History of renal cell carcinoma-s/p left nephrectomy Monitor renal function  History of prostate cancer s/p seed implantation.  History of PTSD/anxiety Stable BuSpar /Lexapro /trazodone .  Class 1 Obesity: Estimated body mass index is 31.92 kg/m as calculated from the following:   Height as of this encounter: 5' 11 (1.803 m).   Weight as of this encounter: 103.8 kg.   Code status:   Code Status: Full Code   DVT Prophylaxis:enoxaparin  (LOVENOX ) injection 40 mg Start: 01/31/24 1600    Family Communication: Spouse-Jody [phone] updated 8/6   Disposition Plan: Status is: Observation The patient will require care spanning > 2 midnights and should be moved to inpatient because: Severe bradycardia of illness   Planned Discharge Destination:Home health  Diet: Diet Order             Diet regular Room service appropriate? Yes; Fluid consistency: Thin  Diet effective now                     Antimicrobial agents: Anti-infectives (From admission, onward)    Start     Dose/Rate Route Frequency Ordered Stop   02/02/24 1000  remdesivir  100 mg in sodium chloride  0.9 % 100 mL IVPB       Placed in Followed by Linked Group    100 mg 200 mL/hr over 30 Minutes Intravenous Daily 02/01/24 0910 02/04/24 0959   02/01/24 1015  remdesivir  200 mg in sodium chloride  0.9% 250 mL IVPB       Placed in Followed by Linked Group   200 mg 580 mL/hr over 30 Minutes Intravenous Once 02/01/24 0910 02/01/24 1313   01/31/24 1345  azithromycin  (ZITHROMAX ) 500 mg in sodium chloride  0.9 % 250 mL IVPB        500 mg 250 mL/hr over 60 Minutes Intravenous  Once 01/31/24 1332 01/31/24 1718   01/31/24 1345  cefTRIAXone  (ROCEPHIN ) 2 g in sodium chloride  0.9 % 100 mL IVPB        2 g 200 mL/hr over 30 Minutes Intravenous  Once 01/31/24 1332 01/31/24 1504        MEDICATIONS: Scheduled Meds:  budesonide   0.25 mg Nebulization BID   enoxaparin  (LOVENOX ) injection  40 mg Subcutaneous Q24H   escitalopram   20 mg Oral Daily   gabapentin   300 mg Oral TID   methylPREDNISolone  (SOLU-MEDROL ) injection  50 mg Intravenous Q12H   sodium chloride  flush  3 mL Intravenous Q12H   traZODone   50 mg Oral QHS   Continuous Infusions:  remdesivir  100 mg in sodium chloride  0.9 % 100 mL IVPB 100 mg (02/02/24 1012)   PRN Meds:.acetaminophen  **OR** acetaminophen , busPIRone , guaiFENesin , ipratropium-albuterol , polyethylene glycol   I have personally reviewed following labs and imaging studies  LABORATORY DATA: CBC: Recent Labs  Lab 01/31/24 0950 01/31/24 1423 02/01/24 0606 02/02/24 0607  WBC 9.3  --  7.0 10.7*  NEUTROABS 4.7  --   --   --   HGB 14.9 15.0 13.4 12.9*  HCT 45.0 44.0 39.3 38.0*  MCV 91.5  --  88.3 90.0  PLT 290  --  246 259    Basic Metabolic Panel: Recent Labs  Lab 01/31/24 0950 01/31/24 1423 02/01/24 0606 02/02/24 0607  NA 132* 132* 133* 133*  K 3.6 3.5 4.1 3.8  CL 96*  --  101 99  CO2 23  --  26 24  GLUCOSE 129*  --  146* 200*  BUN 13  --  14 19  CREATININE 1.44*  --  1.11 1.07  CALCIUM 8.6*  --  8.4* 8.3*    GFR: Estimated Creatinine Clearance: 66.4 mL/min (by C-G formula based on SCr of 1.07 mg/dL).  Liver  Function Tests: Recent Labs  Lab 01/31/24 0950 02/01/24 0606 02/02/24 0607  AST 32 23 24  ALT 20 18 18   ALKPHOS 68 55 49  BILITOT 1.7* 0.4 0.5  PROT 8.2* 7.7 7.2  ALBUMIN  3.0* 2.6* 2.5*   No results for input(s): LIPASE, AMYLASE in the last 168 hours. No results for input(s): AMMONIA in the last 168 hours.  Coagulation Profile: Recent Labs  Lab 01/31/24 0950  INR 1.2    Cardiac Enzymes: No results for input(s): CKTOTAL, CKMB, CKMBINDEX, TROPONINI in the last 168 hours.  BNP (last 3 results) No results for input(s): PROBNP in the last 8760 hours.  Lipid Profile: No results for input(s): CHOL, HDL, LDLCALC, TRIG, CHOLHDL, LDLDIRECT in the last 72 hours.  Thyroid  Function Tests: No results for input(s): TSH, T4TOTAL, FREET4, T3FREE, THYROIDAB in the last 72 hours.  Anemia Panel: No results for input(s): VITAMINB12, FOLATE, FERRITIN, TIBC, IRON, RETICCTPCT in the last 72 hours.  Urine analysis:    Component Value Date/Time   COLORURINE AMBER (A) 01/31/2024 0950   APPEARANCEUR HAZY (A) 01/31/2024 0950   LABSPEC 1.016 01/31/2024 0950   PHURINE 5.0 01/31/2024 0950   GLUCOSEU NEGATIVE 01/31/2024 0950   HGBUR LARGE (A) 01/31/2024 0950   HGBUR 2+ 09/22/2007 1444   BILIRUBINUR NEGATIVE 01/31/2024 0950   BILIRUBINUR n 11/07/2015 1417   KETONESUR NEGATIVE 01/31/2024 0950   PROTEINUR 30 (A) 01/31/2024 0950   UROBILINOGEN 1.0 11/07/2015 1417   UROBILINOGEN 1.0 05/22/2011 1454   NITRITE NEGATIVE 01/31/2024 0950   LEUKOCYTESUR NEGATIVE 01/31/2024 0950    Sepsis Labs: Lactic Acid, Venous    Component Value Date/Time   LATICACIDVEN 1.8 01/31/2024 2030    MICROBIOLOGY: Recent Results (from the past 240 hours)  Culture, blood (Routine x 2)     Status: None (Preliminary result)   Collection Time: 01/31/24  9:45 AM   Specimen: BLOOD  Result Value Ref Range Status   Specimen Description BLOOD SITE NOT SPECIFIED  Final    Special Requests   Final    BOTTLES DRAWN AEROBIC AND ANAEROBIC Blood Culture results may not be optimal due to an inadequate volume of blood received in culture bottles   Culture   Final    NO GROWTH 2 DAYS Performed at [facility], [address], [place], [place] [address]    Report Status PENDING  Incomplete  Culture, blood (Routine x 2)     Status: None (Preliminary result)   Collection Time: 01/31/24  9:50 AM   Specimen: BLOOD  Result Value Ref Range Status   Specimen Description BLOOD SITE NOT SPECIFIED  Final   Special Requests   Final    BOTTLES DRAWN AEROBIC AND ANAEROBIC Blood Culture adequate volume   Culture   Final    NO GROWTH 2 DAYS Performed at [facility], [address] [place], [place] [address]    Report Status PENDING  Incomplete  Resp panel by RT-PCR (RSV, Flu A&B, Covid) Anterior Nasal Swab     Status: Abnormal   Collection Time: 01/31/24 10:08 AM   Specimen: Anterior Nasal Swab  Result Value Ref Range Status   SARS Coronavirus 2 by RT PCR POSITIVE (A) NEGATIVE Final   Influenza A by PCR NEGATIVE NEGATIVE Final   Influenza B by PCR NEGATIVE NEGATIVE Final    Comment: (NOTE) The Xpert Xpress SARS-CoV-2/FLU/RSV plus assay is intended as an aid in the diagnosis of influenza from Nasopharyngeal swab specimens and should not be used as a sole basis for treatment. Nasal washings and aspirates are unacceptable for Xpert Xpress SARS-CoV-2/FLU/RSV testing.  Fact Sheet for Patients: BloggerCourse.com  Fact Sheet for Healthcare Providers: SeriousBroker.it  This test is not yet approved or cleared by the [place]  FDA and has been authorized for detection and/or diagnosis of SARS-CoV-2 by FDA under an Emergency Use Authorization (EUA). This EUA will remain in effect (meaning this test can be used) for the duration of the COVID-19 declaration under Section 564(b)(1) of the Act, 21 U.S.C. section  360bbb-3(b)(1), unless the authorization is  terminated or revoked.     Resp Syncytial Virus by PCR NEGATIVE NEGATIVE Final    Comment: (NOTE) Fact Sheet for Patients: BloggerCourse.com  Fact Sheet for Healthcare Providers: SeriousBroker.it  This test is not yet approved or cleared by the [place]  FDA and has been authorized for detection and/or diagnosis of SARS-CoV-2 by FDA under an Emergency Use Authorization (EUA). This EUA will remain in effect (meaning this test can be used) for the duration of the COVID-19 declaration under Section 564(b)(1) of the Act, 21 U.S.C. section 360bbb-3(b)(1), unless the authorization is terminated or revoked.  Performed at [facility], [address], [place], [place] [address]     RADIOLOGY STUDIES/RESULTS: No results found.    LOS: 1 day   Donalda Applebaum, MD  [facility]    To contact the attending provider between 7A-7P or the covering provider during after hours 7P-7A, please log into the web site [email or web address] and access using universal [place] password for that web site. If you do not have the password, please call the hospital operator.  02/02/2024, 12:48 PM

## 2024-02-02 NOTE — Plan of Care (Signed)

## 2024-02-02 NOTE — Evaluation (Signed)
 Physical Therapy Evaluation Patient Details Name: TREYON WYMORE MRN: [id number] DOB: 10-14-1940 Today's Date: 02/02/2024  History of Present Illness  83 y.o. 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  Pt admitted on 01/31/24 with COVID 19 PNA. Pt with admission last month to [facility] with CHF exacerbation.  Clinical Impression  Pt presents with admitting diagnosis above. Co-treat with OT. Pt today was able to stand and pivot back to bed with CGA/Min A. SpO2 noted to drop into the low 80s on 5L however pleth was questionable. Pt reports that he was receiving HHPT prior to admission and would like to return to that. Recommend HHPT upon DC. PT will continue to follow.         If plan is discharge home, recommend the following: A little help with walking and/or transfers;A little help with bathing/dressing/bathroom;Assistance with cooking/housework;Direct supervision/assist for medications management;Assist for transportation;Help with stairs or ramp for entrance;Supervision due to cognitive status   Can travel by private vehicle        Equipment Recommendations Rolling walker (2 wheels)  Recommendations for Other Services       Functional Status Assessment Patient has had a recent decline in their functional status and demonstrates the ability to make significant improvements in function in a reasonable and predictable amount of time.     Precautions / Restrictions Precautions Precautions: Fall Recall of Precautions/Restrictions: Impaired Restrictions Weight Bearing Restrictions Per Provider Order: No      Mobility  Bed Mobility Overal bed mobility: Needs Assistance Bed Mobility: Sit to Supine       Sit to supine: Min assist   General bed mobility comments: Pt normally sleeps in recliner however  required Min A to return to bed for vascular study.    Transfers Overall transfer level: Needs assistance Equipment used: Rolling walker (2 wheels), None Transfers: Sit to/from Stand, Bed to chair/wheelchair/BSC Sit to Stand: Contact guard assist   Step pivot transfers: Contact guard assist       General transfer comment: LOB when asked to march in place upon standing without AD. CGA back to bed.    Ambulation/Gait               General Gait Details: Deferred due to SpO2 and lack of O2 tank.  Stairs            Wheelchair Mobility     Tilt Bed    Modified Rankin (Stroke Patients Only)       Balance Overall balance assessment: Needs assistance   Sitting balance-Leahy Scale: Good       Standing balance-Leahy Scale: Poor                               Pertinent Vitals/Pain Pain Assessment Pain Assessment: Faces Faces Pain Scale: No hurt    Home Living Family/patient expects to be discharged to:: Private residence Living Arrangements: Spouse/significant other Available Help at Discharge: Family;Available 24 hours/day Type of Home: House Home Access: Level entry     Alternate Level Stairs-Number of Steps: 12 Home Layout: Multi-level;Able to live on main level with bedroom/bathroom Home Equipment: Shower seat - built Char[employer] fundraiser (2 wheels);Cane - single point;Hand held shower head      Prior Function Prior Level of Function : Independent/Modified Independent;Driving  Mobility Comments: rarely leaves home, was receiving HHPT, reports tiring easily ADLs Comments: sits to shower, does not wear socks, wife does IADLs     Extremity/Trunk Assessment   Upper Extremity Assessment Upper Extremity Assessment: Overall WFL for tasks assessed    Lower Extremity Assessment Lower Extremity Assessment: Overall WFL for tasks assessed    Cervical / Trunk Assessment Cervical / Trunk Assessment: Kyphotic  Communication    Communication Communication: Impaired Factors Affecting Communication: Hearing impaired    Cognition Arousal: Alert Behavior During Therapy: WFL for tasks assessed/performed                             Following commands: Intact       Cueing Cueing Techniques: Verbal cues     General Comments General comments (skin integrity, edema, etc.): Pt noted to drop in the low 80s on 5L during mobility however pleth was questionable. HR up to 157 during mobility.    Exercises     Assessment/Plan    PT Assessment Patient needs continued PT services  PT Problem List Decreased strength;Decreased range of motion;Decreased activity tolerance;Decreased balance;Decreased mobility;Decreased coordination;Decreased knowledge of use of DME;Decreased safety awareness;Decreased knowledge of precautions;Cardiopulmonary status limiting activity       PT Treatment Interventions DME instruction;Gait training;Stair training;Functional mobility training;Therapeutic activities;Therapeutic exercise;Balance training;Neuromuscular re-education;Patient/family education    PT Goals (Current goals can be found in the Care Plan section)  Acute Rehab PT Goals Patient Stated Goal: to go home PT Goal Formulation: With patient Time For Goal Achievement: 02/16/24 Potential to Achieve Goals: Good    Frequency Min 2X/week     Co-evaluation               AM-PAC PT 6 Clicks Mobility  Outcome Measure Help needed turning from your back to your side while in a flat bed without using bedrails?: A Little Help needed moving from lying on your back to sitting on the side of a flat bed without using bedrails?: A Little Help needed moving to and from a bed to a chair (including a wheelchair)?: A Little Help needed standing up from a chair using your arms (e.g., wheelchair or bedside chair)?: A Little Help needed to walk in hospital room?: A Little Help needed climbing 3-5 steps with a railing? : A  Little 6 Click Score: 18    End of Session Equipment Utilized During Treatment: Gait belt;Oxygen  Activity Tolerance: Patient tolerated treatment well Patient left: in bed;with call bell/phone within reach;Other (comment) (With vascular tech) Nurse Communication: Mobility status PT Visit Diagnosis: Other abnormalities of gait and mobility (R26.89)    Time: 8499-8467 PT Time Calculation (min) (ACUTE ONLY): 32 min   Charges:   PT Evaluation $PT Eval Moderate Complexity: 1 Mod PT Treatments $Therapeutic Activity: 8-22 mins PT General Charges $$ ACUTE PT VISIT: 1 Visit         Marlin Brys B, PT, DPT Acute Rehab Services [phone]   Shameek Nyquist 02/02/2024, 4:10 PM

## 2024-02-03 ENCOUNTER — Telehealth (HOSPITAL_COMMUNITY): Payer: Self-pay | Admitting: Family Medicine

## 2024-02-03 ENCOUNTER — Telehealth (HOSPITAL_COMMUNITY): Payer: Self-pay | Admitting: Adult Health

## 2024-02-03 DIAGNOSIS — I480 Paroxysmal atrial fibrillation: Secondary | ICD-10-CM | POA: Diagnosis not present

## 2024-02-03 DIAGNOSIS — U071 COVID-19: Secondary | ICD-10-CM | POA: Diagnosis not present

## 2024-02-03 DIAGNOSIS — N182 Chronic kidney disease, stage 2 (mild): Secondary | ICD-10-CM | POA: Diagnosis not present

## 2024-02-03 DIAGNOSIS — N179 Acute kidney failure, unspecified: Secondary | ICD-10-CM | POA: Diagnosis not present

## 2024-02-03 LAB — CBC
HCT: 40 % (ref 39.0–52.0)
Hemoglobin: 13.1 g/dL (ref 13.0–17.0)
MCH: 30.1 pg (ref 26.0–34.0)
MCHC: 32.8 g/dL (ref 30.0–36.0)
MCV: 92 fL (ref 80.0–100.0)
Platelets: 272 K/uL (ref 150–400)
RBC: 4.35 MIL/uL (ref 4.22–5.81)
RDW: 14.3 % (ref 11.5–15.5)
WBC: 10.3 K/uL (ref 4.0–10.5)
nRBC: 0 % (ref 0.0–0.2)

## 2024-02-03 LAB — BASIC METABOLIC PANEL WITH GFR
Anion gap: 7 (ref 5–15)
BUN: 19 mg/dL (ref 8–23)
CO2: 27 mmol/L (ref 22–32)
Calcium: 8.4 mg/dL — ABNORMAL LOW (ref 8.9–10.3)
Chloride: 99 mmol/L (ref 98–111)
Creatinine, Ser: 0.98 mg/dL (ref 0.61–1.24)
GFR, Estimated: >60 mL/min (ref >60)
Glucose, Bld: 126 mg/dL — ABNORMAL HIGH (ref 70–99)
Potassium: 5 mmol/L (ref 3.5–5.1)
Sodium: 133 mmol/L — ABNORMAL LOW (ref 135–145)

## 2024-02-03 LAB — C-REACTIVE PROTEIN: CRP: 3.7 mg/dL — ABNORMAL HIGH (ref <1.0)

## 2024-02-03 NOTE — Progress Notes (Signed)
 PROGRESS NOTE        PATIENT DETAILS Name: Wayne Booth Age: 83 y.o. Sex: male Date of Birth: [birth year] Admit Date: 01/31/2024 Admitting Physician Donalda CHRISTELLA Applebaum, MD ERE:Ylwuzm, Garnette KIDD, MD  Brief Summary: Patient is a 83 y.o.  male with history of HTN, chronic HFpEF, ILD on 5 L of oxygen  at home, essential tremor, renal cell carcinoma s/p left nephrectomy-present with several days history of weakness/cough/shortness of breath-found to have presumed COVID-19 infection pneumonia   (wife who this MD spoke to on 8/4-with URI symptoms-presumed that she had the flu-but has never been tested)  Significant events: 8/4>> admit to [facility]  Significant studies: 8/4>> CXR: Bilateral patchy airspace opacities. 8/6>> B/L lower extremity Doppler: No DVT.  Significant microbiology data: 8/4>> blood cultures: No growth 8/4>> COVID PCR: Positive (CT value 15.7) 8/4>> influenza/RSV PCR: Negative  Procedures: None  Consults: None  Subjective: Feels better-very hesitant about discussing discharge plans-feels he is not ready.  Stable on 4-5 L of oxygen .  Objective: Vitals: Blood pressure (!) 139/108, pulse 76, temperature 97.9 F (36.6 C), temperature source Oral, resp. rate 20, height 5' 11 (1.803 m), weight 103.8 kg, SpO2 98%.   Exam: Awake/alert Chest: Clear to auscultation CVS: S1-S2 regular Abdomen: Soft nontender nondistended Extremities: No edema Nonfocal exam  Pertinent Labs/Radiology:    Latest Ref Rng & Units 02/03/2024    8:03 AM 02/02/2024    6:07 AM 02/01/2024    6:06 AM  CBC  WBC 4.0 - 10.5 K/uL 10.3  10.7  7.0   Hemoglobin 13.0 - 17.0 g/dL 86.8  87.0  86.5   Hematocrit 39.0 - 52.0 % 40.0  38.0  39.3   Platelets 150 - 400 K/uL 272  259  246     Lab Results  Component Value Date   NA 133 (L) 02/03/2024   K 5.0 02/03/2024   CL 99 02/03/2024   CO2 27 02/03/2024      Assessment/Plan: COVID 19 pneumonia Continues to improve-at  baseline 4-5 L of oxygen  CRP downtrending Will complete remdesivir  x 3 days today We will plan on switching him to oral prednisone  8/8 Volume status stable-does not require diuretics Continue to mobilize/encourage incentive spirometry Follow clinical course.  Interstitial lung disease COPD Chronic hypoxic respiratory failure on 5 L of oxygen  at baseline Continue bronchodilators and supportive care.  Chronic HFpEF Euvolemic on exam As needed diuretics-try to keep in negative balance.  AKI on CKD 2 AKI likely hemodynamically mediated Resolved with supportive care.  PAF Maintaining sinus rhythm Per prior discharge summary on 6/27-patient has declined anticoagulation in the past.  History of pulmonary embolism On prophylactic Lovenox  currently Apparently completed several months of anticoagulation in the past D-dimer minimally elevated in the setting of COVID-19 infection-Doppler negative for DVT-doubt further workup required.  OSA Not on CPAP  History of renal cell carcinoma-s/p left nephrectomy Monitor renal function  History of prostate cancer s/p seed implantation.  History of PTSD/anxiety Stable BuSpar /Lexapro /trazodone .  Class 1 Obesity: Estimated body mass index is 31.92 kg/m as calculated from the following:   Height as of this encounter: 5' 11 (1.803 m).   Weight as of this encounter: 103.8 kg.   Code status:   Code Status: Full Code   DVT Prophylaxis:enoxaparin  (LOVENOX ) injection 40 mg Start: 01/31/24 1600    Family Communication: Spouse-Jody Savoia-[phone] updated 8/6  Disposition Plan: Status is: Observation The patient will require care spanning > 2 midnights and should be moved to inpatient because: Severe bradycardia of illness   Planned Discharge Destination:Home health   Diet: Diet Order             Diet regular Room service appropriate? Yes; Fluid consistency: Thin  Diet effective now                     Antimicrobial  agents: Anti-infectives (From admission, onward)    Start     Dose/Rate Route Frequency Ordered Stop   02/02/24 1000  remdesivir  100 mg in sodium chloride  0.9 % 100 mL IVPB       Placed in Followed by Linked Group   100 mg 200 mL/hr over 30 Minutes Intravenous Daily 02/01/24 0910 02/03/24 1056   02/01/24 1015  remdesivir  200 mg in sodium chloride  0.9% 250 mL IVPB       Placed in Followed by Linked Group   200 mg 580 mL/hr over 30 Minutes Intravenous Once 02/01/24 0910 02/01/24 1313   01/31/24 1345  azithromycin  (ZITHROMAX ) 500 mg in sodium chloride  0.9 % 250 mL IVPB        500 mg 250 mL/hr over 60 Minutes Intravenous  Once 01/31/24 1332 01/31/24 1718   01/31/24 1345  cefTRIAXone  (ROCEPHIN ) 2 g in sodium chloride  0.9 % 100 mL IVPB        2 g 200 mL/hr over 30 Minutes Intravenous  Once 01/31/24 1332 01/31/24 1504        MEDICATIONS: Scheduled Meds:  budesonide   0.25 mg Nebulization BID   enoxaparin  (LOVENOX ) injection  40 mg Subcutaneous Q24H   escitalopram   20 mg Oral Daily   gabapentin   300 mg Oral TID   methylPREDNISolone  (SOLU-MEDROL ) injection  50 mg Intravenous Q12H   sodium chloride  flush  3 mL Intravenous Q12H   traZODone   50 mg Oral QHS   Continuous Infusions:   PRN Meds:.acetaminophen  **OR** acetaminophen , busPIRone , guaiFENesin , ipratropium-albuterol , polyethylene glycol   I have personally reviewed following labs and imaging studies  LABORATORY DATA: CBC: Recent Labs  Lab 01/31/24 0950 01/31/24 1423 02/01/24 0606 02/02/24 0607 02/03/24 0803  WBC 9.3  --  7.0 10.7* 10.3  NEUTROABS 4.7  --   --   --   --   HGB 14.9 15.0 13.4 12.9* 13.1  HCT 45.0 44.0 39.3 38.0* 40.0  MCV 91.5  --  88.3 90.0 92.0  PLT 290  --  246 259 272    Basic Metabolic Panel: Recent Labs  Lab 01/31/24 0950 01/31/24 1423 02/01/24 0606 02/02/24 0607 02/03/24 0803  NA 132* 132* 133* 133* 133*  K 3.6 3.5 4.1 3.8 5.0  CL 96*  --  101 99 99  CO2 23  --  26 24 27   GLUCOSE  129*  --  146* 200* 126*  BUN 13  --  14 19 19   CREATININE 1.44*  --  1.11 1.07 0.98  CALCIUM 8.6*  --  8.4* 8.3* 8.4*    GFR: Estimated Creatinine Clearance: 72.5 mL/min (by C-G formula based on SCr of 0.98 mg/dL).  Liver Function Tests: Recent Labs  Lab 01/31/24 0950 02/01/24 0606 02/02/24 0607  AST 32 23 24  ALT 20 18 18   ALKPHOS 68 55 49  BILITOT 1.7* 0.4 0.5  PROT 8.2* 7.7 7.2  ALBUMIN  3.0* 2.6* 2.5*   No results for input(s): LIPASE, AMYLASE in the last 168 hours. No results for  input(s): AMMONIA in the last 168 hours.  Coagulation Profile: Recent Labs  Lab 01/31/24 0950  INR 1.2    Cardiac Enzymes: No results for input(s): CKTOTAL, CKMB, CKMBINDEX, TROPONINI in the last 168 hours.  BNP (last 3 results) No results for input(s): PROBNP in the last 8760 hours.  Lipid Profile: No results for input(s): CHOL, HDL, LDLCALC, TRIG, CHOLHDL, LDLDIRECT in the last 72 hours.  Thyroid  Function Tests: No results for input(s): TSH, T4TOTAL, FREET4, T3FREE, THYROIDAB in the last 72 hours.  Anemia Panel: No results for input(s): VITAMINB12, FOLATE, FERRITIN, TIBC, IRON, RETICCTPCT in the last 72 hours.  Urine analysis:    Component Value Date/Time   COLORURINE AMBER (A) 01/31/2024 0950   APPEARANCEUR HAZY (A) 01/31/2024 0950   LABSPEC 1.016 01/31/2024 0950   PHURINE 5.0 01/31/2024 0950   GLUCOSEU NEGATIVE 01/31/2024 0950   HGBUR LARGE (A) 01/31/2024 0950   HGBUR 2+ 09/22/2007 1444   BILIRUBINUR NEGATIVE 01/31/2024 0950   BILIRUBINUR n 11/07/2015 1417   KETONESUR NEGATIVE 01/31/2024 0950   PROTEINUR 30 (A) 01/31/2024 0950   UROBILINOGEN 1.0 11/07/2015 1417   UROBILINOGEN 1.0 05/22/2011 1454   NITRITE NEGATIVE 01/31/2024 0950   LEUKOCYTESUR NEGATIVE 01/31/2024 0950    Sepsis Labs: Lactic Acid, Venous    Component Value Date/Time   LATICACIDVEN 1.8 01/31/2024 2030    MICROBIOLOGY: Recent Results (from the  past 240 hours)  Culture, blood (Routine x 2)     Status: None (Preliminary result)   Collection Time: 01/31/24  9:45 AM   Specimen: BLOOD  Result Value Ref Range Status   Specimen Description BLOOD SITE NOT SPECIFIED  Final   Special Requests   Final    BOTTLES DRAWN AEROBIC AND ANAEROBIC Blood Culture results may not be optimal due to an inadequate volume of blood received in culture bottles   Culture   Final    NO GROWTH 3 DAYS Performed at [facility], [address], [place], [place] [address]    Report Status PENDING  Incomplete  Culture, blood (Routine x 2)     Status: None (Preliminary result)   Collection Time: 01/31/24  9:50 AM   Specimen: BLOOD  Result Value Ref Range Status   Specimen Description BLOOD SITE NOT SPECIFIED  Final   Special Requests   Final    BOTTLES DRAWN AEROBIC AND ANAEROBIC Blood Culture adequate volume   Culture   Final    NO GROWTH 3 DAYS Performed at [facility], [address] [place], [place] [address]    Report Status PENDING  Incomplete  Resp panel by RT-PCR (RSV, Flu A&B, Covid) Anterior Nasal Swab     Status: Abnormal   Collection Time: 01/31/24 10:08 AM   Specimen: Anterior Nasal Swab  Result Value Ref Range Status   SARS Coronavirus 2 by RT PCR POSITIVE (A) NEGATIVE Final   Influenza A by PCR NEGATIVE NEGATIVE Final   Influenza B by PCR NEGATIVE NEGATIVE Final    Comment: (NOTE) The Xpert Xpress SARS-CoV-2/FLU/RSV plus assay is intended as an aid in the diagnosis of influenza from Nasopharyngeal swab specimens and should not be used as a sole basis for treatment. Nasal washings and aspirates are unacceptable for Xpert Xpress SARS-CoV-2/FLU/RSV testing.  Fact Sheet for Patients: BloggerCourse.com  Fact Sheet for Healthcare Providers: SeriousBroker.it  This test is not yet approved or cleared by the [place]  FDA and has been authorized for detection and/or  diagnosis of SARS-CoV-2 by FDA under  an Emergency Use Authorization (EUA). This EUA will remain in effect (meaning this test can be used) for the duration of the COVID-19 declaration under Section 564(b)(1) of the Act, 21 U.S.C. section 360bbb-3(b)(1), unless the authorization is terminated or revoked.     Resp Syncytial Virus by PCR NEGATIVE NEGATIVE Final    Comment: (NOTE) Fact Sheet for Patients: BloggerCourse.com  Fact Sheet for Healthcare Providers: SeriousBroker.it  This test is not yet approved or cleared by the [place]  FDA and has been authorized for detection and/or diagnosis of SARS-CoV-2 by FDA under an Emergency Use Authorization (EUA). This EUA will remain in effect (meaning this test can be used) for the duration of the COVID-19 declaration under Section 564(b)(1) of the Act, 21 U.S.C. section 360bbb-3(b)(1), unless the authorization is terminated or revoked.  Performed at [facility], [address] [place], [place] [address]     RADIOLOGY STUDIES/RESULTS: VAS US  LOWER EXTREMITY VENOUS (DVT) Result Date: 02/02/2024  Lower Venous DVT Study Patient Name:  Wayne Booth Chouinard  Date of Exam:   02/02/2024 Medical Rec #: [id number]        Accession #:    [id number] Date of Birth: Oct 15, 1940       Patient Gender: M Patient Age:   55 years Exam Location:  [facility] Procedure:      VAS US  LOWER EXTREMITY VENOUS (DVT) Referring Phys: DONALDA APPLEBAUM --------------------------------------------------------------------------------  Indications: Swelling, and ++ Covid.  Risk Factors: COPD, CAD. Comparison Study: 03/03/2018- LLE negative Performing Technologist: Ricka Sturdivant-Jones RDMS, RVT  Examination Guidelines: A complete evaluation includes B-mode imaging, spectral Doppler, color Doppler, and power Doppler as needed of all accessible portions of each vessel. Bilateral testing is considered an integral part of a  complete examination. Limited examinations for reoccurring indications may be performed as noted. The reflux portion of the exam is performed with the patient in reverse Trendelenburg.  +---------+---------------+---------+-----------+----------+--------------+ RIGHT    CompressibilityPhasicitySpontaneityPropertiesThrombus Aging +---------+---------------+---------+-----------+----------+--------------+ CFV      Full           Yes      Yes                                 +---------+---------------+---------+-----------+----------+--------------+ SFJ      Full                                                        +---------+---------------+---------+-----------+----------+--------------+ FV Prox  Full                                                        +---------+---------------+---------+-----------+----------+--------------+ FV Mid   Full                                                        +---------+---------------+---------+-----------+----------+--------------+ FV DistalFull                                                        +---------+---------------+---------+-----------+----------+--------------+  PFV      Full                                                        +---------+---------------+---------+-----------+----------+--------------+ POP      Full           Yes      Yes                                 +---------+---------------+---------+-----------+----------+--------------+ PTV      Full                                                        +---------+---------------+---------+-----------+----------+--------------+ PERO     Full                                                        +---------+---------------+---------+-----------+----------+--------------+   +---------+---------------+---------+-----------+----------+--------------+ LEFT     CompressibilityPhasicitySpontaneityPropertiesThrombus Aging  +---------+---------------+---------+-----------+----------+--------------+ CFV      Full           Yes      Yes                                 +---------+---------------+---------+-----------+----------+--------------+ SFJ      Full                                                        +---------+---------------+---------+-----------+----------+--------------+ FV Prox  Full                                                        +---------+---------------+---------+-----------+----------+--------------+ FV Mid   Full                                                        +---------+---------------+---------+-----------+----------+--------------+ FV DistalFull                                                        +---------+---------------+---------+-----------+----------+--------------+ PFV      Full                                                        +---------+---------------+---------+-----------+----------+--------------+  POP      Full           Yes      Yes                                 +---------+---------------+---------+-----------+----------+--------------+ PTV      Full                                                        +---------+---------------+---------+-----------+----------+--------------+ PERO     Full                                                        +---------+---------------+---------+-----------+----------+--------------+     Summary: BILATERAL: - No evidence of deep vein thrombosis seen in the lower extremities, bilaterally. -No evidence of popliteal cyst, bilaterally.   *See table(s) above for measurements and observations. Electronically signed by Gaile New MD on 02/02/2024 at 6:11:05 PM.    Final       LOS: 2 days   Donalda Applebaum, MD  [facility]    To contact the attending provider between 7A-7P or the covering provider during after hours 7P-7A, please log into the web site [email or web address] and  access using universal [place] password for that web site. If you do not have the password, please call the hospital operator.  02/03/2024, 11:45 AM

## 2024-02-03 NOTE — Plan of Care (Signed)

## 2024-02-03 NOTE — Progress Notes (Signed)
 Occupational Therapy Treatment Patient Details Name: Wayne Booth MRN: [id number] DOB: 27-Nov-1940 Today's Date: 02/03/2024   History of present illness 83 y.o. 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  Pt admitted on 01/31/24 with COVID 19 PNA. Pt with admission last month to [facility] with CHF exacerbation.   OT comments  Patient received in recliner and eager to participate. Patient was supervision to stand from recliner and CGA to ambulate to sink. Patient began grooming standing but required seated rest break to complete. Patient performed bathing and dressing seated with assistance for feet for LB bathing. Patient able to doff socks but required min assist to donn.  Patient returned to recliner at end of session. Discharge recommendations continue to be appropriate. Acute OT to continue to follow to address established goals.       If plan is discharge home, recommend the following:  A little help with walking and/or transfers;A little help with bathing/dressing/bathroom;Assistance with cooking/housework;Help with stairs or ramp for entrance;Assist for transportation   Equipment Recommendations  None recommended by OT    Recommendations for Other Services      Precautions / Restrictions Precautions Precautions: Fall Recall of Precautions/Restrictions: Impaired Precaution/Restrictions Comments: Watch O2/HR Restrictions Weight Bearing Restrictions Per Provider Order: No       Mobility Bed Mobility               General bed mobility comments: OOB in recliner    Transfers Overall transfer level: Needs assistance Equipment used: Rolling walker (2 wheels) Transfers: Sit to/from Stand Sit to Stand: Supervision           General transfer comment: supervision for sit to stand and CGA  for transfer     Balance Overall balance assessment: Needs assistance   Sitting balance-Leahy Scale: Good Sitting balance - Comments: seated in recliner without back support   Standing balance support: Bilateral upper extremity supported Standing balance-Leahy Scale: Poor Standing balance comment: reliant on UE support                           ADL either performed or assessed with clinical judgement   ADL Overall ADL's : Needs assistance/impaired     Grooming: Wash/dry hands;Wash/dry face;Oral care;Brushing hair;Set up;Sitting Grooming Details (indicate cue type and reason): began performing in standing but became fatigued and completed in sitting Upper Body Bathing: Set up;Sitting   Lower Body Bathing: Minimal assistance;Sit to/from stand Lower Body Bathing Details (indicate cue type and reason): assistance with feet Upper Body Dressing : Set up;Sitting Upper Body Dressing Details (indicate cue type and reason): gown change Lower Body Dressing: Minimal assistance;Sit to/from stand Lower Body Dressing Details (indicate cue type and reason): Patient able to doff socks and requied min assist to [employer] Transfer: Contact guard assist;Rolling walker (2 wheels) Toilet Transfer Details (indicate cue type and reason): simulated         Functional mobility during ADLs: Contact guard assist;Rolling walker (2 wheels)      Extremity/Trunk Assessment              Vision       Perception     Praxis     Communication Communication Communication: Impaired Factors Affecting Communication: Hearing impaired   Cognition Arousal: Alert Behavior During Therapy: WFL for tasks assessed/performed Cognition:  No apparent impairments                               Following commands: Intact        Cueing   Cueing Techniques: Verbal cues  Exercises      Shoulder Instructions       General Comments      Pertinent Vitals/ Pain       Pain  Assessment Pain Assessment: No/denies pain  Home Living                                          Prior Functioning/Environment              Frequency  Min 2X/week        Progress Toward Goals  OT Goals(current goals can now be found in the care plan section)  Progress towards OT goals: Progressing toward goals  Acute Rehab OT Goals OT Goal Formulation: With patient Time For Goal Achievement: 02/16/24 Potential to Achieve Goals: Good ADL Goals Pt Will Perform Grooming: with supervision;standing Pt Will Perform Lower Body Bathing: with supervision;sit to/from stand Pt Will Perform Lower Body Dressing: with supervision Pt Will Transfer to Toilet: with supervision;ambulating Pt Will Perform Toileting - Clothing Manipulation and hygiene: with supervision;sit to/from stand Additional ADL Goal #1: Pt will generalize energy conservation strategies and pursed lip breathing techniques during ADLs and mobility.  Plan      Co-evaluation                 AM-PAC OT 6 Clicks Daily Activity     Outcome Measure   Help from another person eating meals?: None Help from another person taking care of personal grooming?: A Little Help from another person toileting, which includes using toliet, bedpan, or urinal?: A Little Help from another person bathing (including washing, rinsing, drying)?: A Little Help from another person to put on and taking off regular upper body clothing?: A Little Help from another person to put on and taking off regular lower body clothing?: A Little 6 Click Score: 19    End of Session Equipment Utilized During Treatment: Gait belt;Rolling walker (2 wheels);Oxygen  (5 liters)  OT Visit Diagnosis: Unsteadiness on feet (R26.81);Other abnormalities of gait and mobility (R26.89);Other (comment) (decreased activity tolerance)   Activity Tolerance Patient tolerated treatment well   Patient Left in chair;with call bell/phone within  reach   Nurse Communication Mobility status        Time: 8941-8870 OT Time Calculation (min): 31 min  Charges: OT General Charges $OT Visit: 1 Visit OT Treatments $Self Care/Home Management : 23-37 mins  Dick Laine, OTA Acute Rehabilitation Services  Office [phone]   Jeb LITTIE Laine 02/03/2024, 1:16 PM

## 2024-02-03 NOTE — Progress Notes (Signed)
 Physical Therapy Treatment Patient Details Name: Wayne Booth MRN: [id number] DOB: September 17, 1940 Today's Date: 02/03/2024   History of Present Illness 83 y.o. 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  Pt admitted on 01/31/24 with COVID 19 PNA. Pt with admission last month to [facility] with CHF exacerbation.    PT Comments  Pt making steady progress towards his physical therapy goals and is eager to participate. Performed warm up BLE exercise and ambulating 80 ft with a walker and CGA. Desat to 84% on 6L O2 during activity; brief spike to 150 HR upon return to room but quickly descended to 116 (RN notified). Provided with IS and instructed on use; pt pulling 500 cc. Will benefit from daily mobility while inpatient.    If plan is discharge home, recommend the following: A little help with walking and/or transfers;A little help with bathing/dressing/bathroom;Assistance with cooking/housework;Direct supervision/assist for medications management;Assist for transportation;Help with stairs or ramp for entrance;Supervision due to cognitive status   Can travel by private vehicle        Equipment Recommendations  None recommended by PT    Recommendations for Other Services       Precautions / Restrictions Precautions Precautions: Fall Recall of Precautions/Restrictions: Impaired Precaution/Restrictions Comments: Watch O2/HR Restrictions Weight Bearing Restrictions Per Provider Order: No     Mobility  Bed Mobility               General bed mobility comments: OOB in chair upon entry (typically sleeps in recliner)    Transfers Overall transfer level: Needs assistance Equipment used: Rolling walker (2 wheels) Transfers: Sit to/from Stand Sit to Stand: Supervision           General transfer comment:  Verbal cues for hand placement prior to transition    Ambulation/Gait Ambulation/Gait assistance: Contact guard assist Gait Distance (Feet): 80 Feet Assistive device: Rolling walker (2 wheels) Gait Pattern/deviations: Step-through pattern, Decreased stride length       General Gait Details: Good cadence, cues for pursed lip breathing, pt able to self monitor for activity tolerance and verbalize when he needed to turn around   Stairs             Wheelchair Mobility     Tilt Bed    Modified Rankin (Stroke Patients Only)       Balance Overall balance assessment: Needs assistance   Sitting balance-Leahy Scale: Good     Standing balance support: Bilateral upper extremity supported Standing balance-Leahy Scale: Poor                              Communication Communication Communication: Impaired Factors Affecting Communication: Hearing impaired  Cognition Arousal: Alert Behavior During Therapy: WFL for tasks assessed/performed   PT - Cognitive impairments: No apparent impairments                         Following commands: Intact      Cueing Cueing Techniques: Verbal cues  Exercises      General Comments        Pertinent Vitals/Pain Pain Assessment Pain Assessment: No/denies pain    Home Living  Prior Function            PT Goals (current goals can now be found in the care plan section) Acute Rehab PT Goals Patient Stated Goal: to go home PT Goal Formulation: With patient Time For Goal Achievement: 02/16/24 Potential to Achieve Goals: Good Progress towards PT goals: Progressing toward goals    Frequency    Min 2X/week      PT Plan      Co-evaluation              AM-PAC PT 6 Clicks Mobility   Outcome Measure  Help needed turning from your back to your side while in a flat bed without using bedrails?: A Little Help needed moving from lying on your back to sitting  on the side of a flat bed without using bedrails?: A Little Help needed moving to and from a bed to a chair (including a wheelchair)?: A Little Help needed standing up from a chair using your arms (e.g., wheelchair or bedside chair)?: A Little Help needed to walk in hospital room?: A Little Help needed climbing 3-5 steps with a railing? : A Little 6 Click Score: 18    End of Session Equipment Utilized During Treatment: Gait belt;Oxygen  Activity Tolerance: Patient tolerated treatment well Patient left: with call bell/phone within reach;in chair Nurse Communication: Mobility status PT Visit Diagnosis: Other abnormalities of gait and mobility (R26.89)     Time: 9091-9059 PT Time Calculation (min) (ACUTE ONLY): 32 min  Charges:    $Therapeutic Activity: 23-37 mins PT General Charges $$ ACUTE PT VISIT: 1 Visit                     Aleck Daring, PT, DPT Acute Rehabilitation Services Office [phone]    Alayne ONEIDA Daring 02/03/2024, 9:47 AM

## 2024-02-04 DIAGNOSIS — U071 COVID-19: Secondary | ICD-10-CM | POA: Diagnosis not present

## 2024-02-04 DIAGNOSIS — I480 Paroxysmal atrial fibrillation: Secondary | ICD-10-CM | POA: Diagnosis not present

## 2024-02-04 DIAGNOSIS — N179 Acute kidney failure, unspecified: Secondary | ICD-10-CM | POA: Diagnosis not present

## 2024-02-04 DIAGNOSIS — N182 Chronic kidney disease, stage 2 (mild): Secondary | ICD-10-CM | POA: Diagnosis not present

## 2024-02-04 LAB — BASIC METABOLIC PANEL WITH GFR
Anion gap: 9 (ref 5–15)
BUN: 25 mg/dL — ABNORMAL HIGH (ref 8–23)
CO2: 29 mmol/L (ref 22–32)
Calcium: 8.6 mg/dL — ABNORMAL LOW (ref 8.9–10.3)
Chloride: 99 mmol/L (ref 98–111)
Creatinine, Ser: 1.15 mg/dL (ref 0.61–1.24)
GFR, Estimated: >60 mL/min (ref >60)
Glucose, Bld: 164 mg/dL — ABNORMAL HIGH (ref 70–99)
Potassium: 5.5 mmol/L — ABNORMAL HIGH (ref 3.5–5.1)
Sodium: 137 mmol/L (ref 135–145)

## 2024-02-04 LAB — C-REACTIVE PROTEIN: CRP: 2.4 mg/dL — ABNORMAL HIGH (ref <1.0)

## 2024-02-04 MED ORDER — BUSPIRONE HCL 5 MG PO TABS
5.0000 mg | ORAL_TABLET | Freq: Two times a day (BID) | ORAL | Status: DC | PRN
  Administered 2024-02-04 – 2024-02-05 (×2): 5 mg via ORAL
  Filled 2024-02-04 (×2): qty 1

## 2024-02-04 MED ORDER — FUROSEMIDE 40 MG PO TABS
40.0000 mg | ORAL_TABLET | Freq: Every day | ORAL | Status: DC
  Administered 2024-02-05 – 2024-02-06 (×2): 40 mg via ORAL
  Filled 2024-02-04 (×2): qty 1

## 2024-02-04 MED ORDER — SODIUM ZIRCONIUM CYCLOSILICATE 5 G PO PACK
5.0000 g | PACK | Freq: Every day | ORAL | Status: AC
  Administered 2024-02-04: 5 g via ORAL
  Filled 2024-02-04: qty 1

## 2024-02-04 MED ORDER — ARFORMOTEROL TARTRATE 15 MCG/2ML IN NEBU
15.0000 ug | INHALATION_SOLUTION | Freq: Two times a day (BID) | RESPIRATORY_TRACT | Status: DC
  Administered 2024-02-04 – 2024-02-06 (×5): 15 ug via RESPIRATORY_TRACT
  Filled 2024-02-04 (×5): qty 2

## 2024-02-04 MED ORDER — REVEFENACIN 175 MCG/3ML IN SOLN
175.0000 ug | Freq: Every day | RESPIRATORY_TRACT | Status: DC
  Administered 2024-02-04 – 2024-02-06 (×3): 175 ug via RESPIRATORY_TRACT
  Filled 2024-02-04 (×3): qty 3

## 2024-02-04 MED ORDER — FUROSEMIDE 10 MG/ML IJ SOLN
20.0000 mg | Freq: Once | INTRAMUSCULAR | Status: AC
  Administered 2024-02-04: 20 mg via INTRAVENOUS
  Filled 2024-02-04: qty 2

## 2024-02-04 MED ORDER — SPIRONOLACTONE 25 MG PO TABS
25.0000 mg | ORAL_TABLET | Freq: Every day | ORAL | Status: DC
  Administered 2024-02-04 – 2024-02-06 (×3): 25 mg via ORAL
  Filled 2024-02-04 (×3): qty 1

## 2024-02-04 MED ORDER — PREDNISONE 20 MG PO TABS
40.0000 mg | ORAL_TABLET | Freq: Every day | ORAL | Status: DC
  Administered 2024-02-04 – 2024-02-05 (×2): 40 mg via ORAL
  Filled 2024-02-04 (×3): qty 2

## 2024-02-04 NOTE — Progress Notes (Signed)
 PROGRESS NOTE        PATIENT DETAILS Name: Wayne Booth Age: 83 y.o. Sex: male Date of Birth: 1941-03-23 Admit Date: 01/31/2024 Admitting Physician Donalda CHRISTELLA Applebaum, MD ERE:Ylwuzm, Garnette KIDD, MD  Brief Summary: Patient is a 83 y.o.  male with history of HTN, chronic HFpEF, ILD on 5 L of oxygen  at home, essential tremor, renal cell carcinoma s/p left nephrectomy-present with several days history of weakness/cough/shortness of breath-found to have presumed COVID-19 infection pneumonia   (wife who this MD spoke to on 8/4-with URI symptoms-presumed that she had the flu-but has never been tested)  Significant events: 8/4>> admit to [facility]  Significant studies: 8/4>> CXR: Bilateral patchy airspace opacities. 8/6>> B/L lower extremity Doppler: No DVT.  Significant microbiology data: 8/4>> blood cultures: No growth 8/4>> COVID PCR: Positive (CT value 15.7) 8/4>> influenza/RSV PCR: Negative  Procedures: None  Consults: None  Subjective: Some cough but no major issues overnight.  Although better-he is still is not back to baseline in terms of his shortness of breath.  Very anxious about discussing discharge plans.  Objective: Vitals: Blood pressure 134/82, pulse (!) 56, temperature 97.6 F (36.4 C), temperature source Oral, resp. rate 18, height 5' 11 (1.803 m), weight 103.8 kg, SpO2 94%.   Exam: Awake/alert Chest: Some scattered rhonchi but mostly clear CVS: S1-S2 regular Extremities: No edema Nonfocal exam.  Pertinent Labs/Radiology:    Latest Ref Rng & Units 02/03/2024    8:03 AM 02/02/2024    6:07 AM 02/01/2024    6:06 AM  CBC  WBC 4.0 - 10.5 K/uL 10.3  10.7  7.0   Hemoglobin 13.0 - 17.0 g/dL 86.8  87.0  86.5   Hematocrit 39.0 - 52.0 % 40.0  38.0  39.3   Platelets 150 - 400 K/uL 272  259  246     Lab Results  Component Value Date   NA 137 02/04/2024   K 5.5 (H) 02/04/2024   CL 99 02/04/2024   CO2 29 02/04/2024       Assessment/Plan: COVID 19 pneumonia Overall essentially unchanged Stable-on 4-5 L of oxygen  (5 L at baseline) CRP down trended significantly Completed remdesivir  x 3 days 8/7 Switch to oral prednisone  8/8 Some trace edema today-1 dose of IV Lasix  Continue to mobilize with PT/OT Encourage use of incentive spirometry/flutter valve Hopefully home in the next 1-days depending on clinical improvement.  Interstitial lung disease COPD Chronic hypoxic respiratory failure on 5 L of oxygen  at baseline Continue bronchodilators and supportive care.  Chronic HFpEF Euvolemic on exam-though has some mild trace leg edema today 1 dose of IV Lasix  Resume Aldactone  today Start usual dosing of oral Lasix  8/9.  AKI on CKD 2 AKI likely hemodynamically mediated Resolved with supportive care.  PAF Maintaining sinus rhythm Per prior discharge summary on 6/27-patient has declined anticoagulation in the past.  History of pulmonary embolism On prophylactic Lovenox  currently Apparently completed several months of anticoagulation in the past D-dimer minimally elevated in the setting of COVID-19 infection-Doppler negative for DVT-doubt further workup required.  OSA Not on CPAP  History of renal cell carcinoma-s/p left nephrectomy Monitor renal function  History of prostate cancer s/p seed implantation.  History of PTSD/anxiety Stable BuSpar /Lexapro /trazodone .  Class 1 Obesity: Estimated body mass index is 31.92 kg/m as calculated from the following:   Height as of this encounter: 5' 11 (  1.803 m).   Weight as of this encounter: 103.8 kg.   Code status:   Code Status: Full Code   DVT Prophylaxis:enoxaparin  (LOVENOX ) injection 40 mg Start: 01/31/24 1600    Family Communication: Spouse-Jody Shular-[phone] updated 8/8   Disposition Plan: Status is: Observation The patient will require care spanning > 2 midnights and should be moved to inpatient because: Severe bradycardia of  illness   Planned Discharge Destination:Home health   Diet: Diet Order             Diet regular Room service appropriate? Yes; Fluid consistency: Thin  Diet effective now                     Antimicrobial agents: Anti-infectives (From admission, onward)    Start     Dose/Rate Route Frequency Ordered Stop   02/02/24 1000  remdesivir  100 mg in sodium chloride  0.9 % 100 mL IVPB       Placed in Followed by Linked Group   100 mg 200 mL/hr over 30 Minutes Intravenous Daily 02/01/24 0910 02/03/24 1056   02/01/24 1015  remdesivir  200 mg in sodium chloride  0.9% 250 mL IVPB       Placed in Followed by Linked Group   200 mg 580 mL/hr over 30 Minutes Intravenous Once 02/01/24 0910 02/01/24 1313   01/31/24 1345  azithromycin  (ZITHROMAX ) 500 mg in sodium chloride  0.9 % 250 mL IVPB        500 mg 250 mL/hr over 60 Minutes Intravenous  Once 01/31/24 1332 01/31/24 1718   01/31/24 1345  cefTRIAXone  (ROCEPHIN ) 2 g in sodium chloride  0.9 % 100 mL IVPB        2 g 200 mL/hr over 30 Minutes Intravenous  Once 01/31/24 1332 01/31/24 1504        MEDICATIONS: Scheduled Meds:  budesonide   0.25 mg Nebulization BID   enoxaparin  (LOVENOX ) injection  40 mg Subcutaneous Q24H   escitalopram   20 mg Oral Daily   gabapentin   300 mg Oral TID   methylPREDNISolone  (SOLU-MEDROL ) injection  50 mg Intravenous Q12H   sodium chloride  flush  3 mL Intravenous Q12H   traZODone   50 mg Oral QHS   Continuous Infusions:   PRN Meds:.acetaminophen  **OR** acetaminophen , busPIRone , guaiFENesin , ipratropium-albuterol , polyethylene glycol   I have personally reviewed following labs and imaging studies  LABORATORY DATA: CBC: Recent Labs  Lab 01/31/24 0950 01/31/24 1423 02/01/24 0606 02/02/24 0607 02/03/24 0803  WBC 9.3  --  7.0 10.7* 10.3  NEUTROABS 4.7  --   --   --   --   HGB 14.9 15.0 13.4 12.9* 13.1  HCT 45.0 44.0 39.3 38.0* 40.0  MCV 91.5  --  88.3 90.0 92.0  PLT 290  --  246 259 272     Basic Metabolic Panel: Recent Labs  Lab 01/31/24 0950 01/31/24 1423 02/01/24 0606 02/02/24 0607 02/03/24 0803 02/04/24 0649  NA 132* 132* 133* 133* 133* 137  K 3.6 3.5 4.1 3.8 5.0 5.5*  CL 96*  --  101 99 99 99  CO2 23  --  26 24 27 29   GLUCOSE 129*  --  146* 200* 126* 164*  BUN 13  --  14 19 19  25*  CREATININE 1.44*  --  1.11 1.07 0.98 1.15  CALCIUM 8.6*  --  8.4* 8.3* 8.4* 8.6*    GFR: Estimated Creatinine Clearance: 61.8 mL/min (by C-G formula based on SCr of 1.15 mg/dL).  Liver Function Tests: Recent Labs  Lab 01/31/24 0950 02/01/24 0606 02/02/24 0607  AST 32 23 24  ALT 20 18 18   ALKPHOS 68 55 49  BILITOT 1.7* 0.4 0.5  PROT 8.2* 7.7 7.2  ALBUMIN  3.0* 2.6* 2.5*   No results for input(s): LIPASE, AMYLASE in the last 168 hours. No results for input(s): AMMONIA in the last 168 hours.  Coagulation Profile: Recent Labs  Lab 01/31/24 0950  INR 1.2    Cardiac Enzymes: No results for input(s): CKTOTAL, CKMB, CKMBINDEX, TROPONINI in the last 168 hours.  BNP (last 3 results) No results for input(s): PROBNP in the last 8760 hours.  Lipid Profile: No results for input(s): CHOL, HDL, LDLCALC, TRIG, CHOLHDL, LDLDIRECT in the last 72 hours.  Thyroid  Function Tests: No results for input(s): TSH, T4TOTAL, FREET4, T3FREE, THYROIDAB in the last 72 hours.  Anemia Panel: No results for input(s): VITAMINB12, FOLATE, FERRITIN, TIBC, IRON, RETICCTPCT in the last 72 hours.  Urine analysis:    Component Value Date/Time   COLORURINE AMBER (A) 01/31/2024 0950   APPEARANCEUR HAZY (A) 01/31/2024 0950   LABSPEC 1.016 01/31/2024 0950   PHURINE 5.0 01/31/2024 0950   GLUCOSEU NEGATIVE 01/31/2024 0950   HGBUR LARGE (A) 01/31/2024 0950   HGBUR 2+ 09/22/2007 1444   BILIRUBINUR NEGATIVE 01/31/2024 0950   BILIRUBINUR n 11/07/2015 1417   KETONESUR NEGATIVE 01/31/2024 0950   PROTEINUR 30 (A) 01/31/2024 0950   UROBILINOGEN 1.0  11/07/2015 1417   UROBILINOGEN 1.0 05/22/2011 1454   NITRITE NEGATIVE 01/31/2024 0950   LEUKOCYTESUR NEGATIVE 01/31/2024 0950    Sepsis Labs: Lactic Acid, Venous    Component Value Date/Time   LATICACIDVEN 1.8 01/31/2024 2030    MICROBIOLOGY: Recent Results (from the past 240 hours)  Culture, blood (Routine x 2)     Status: None (Preliminary result)   Collection Time: 01/31/24  9:45 AM   Specimen: BLOOD  Result Value Ref Range Status   Specimen Description BLOOD SITE NOT SPECIFIED  Final   Special Requests   Final    BOTTLES DRAWN AEROBIC AND ANAEROBIC Blood Culture results may not be optimal due to an inadequate volume of blood received in culture bottles   Culture   Final    NO GROWTH 4 DAYS Performed at [facility], [address], [place], [place] [address]    Report Status PENDING  Incomplete  Culture, blood (Routine x 2)     Status: None (Preliminary result)   Collection Time: 01/31/24  9:50 AM   Specimen: BLOOD  Result Value Ref Range Status   Specimen Description BLOOD SITE NOT SPECIFIED  Final   Special Requests   Final    BOTTLES DRAWN AEROBIC AND ANAEROBIC Blood Culture adequate volume   Culture   Final    NO GROWTH 4 DAYS Performed at [facility], [address], [place], [place] [address]    Report Status PENDING  Incomplete  Resp panel by RT-PCR (RSV, Flu A&B, Covid) Anterior Nasal Swab     Status: Abnormal   Collection Time: 01/31/24 10:08 AM   Specimen: Anterior Nasal Swab  Result Value Ref Range Status   SARS Coronavirus 2 by RT PCR POSITIVE (A) NEGATIVE Final   Influenza A by PCR NEGATIVE NEGATIVE Final   Influenza B by PCR NEGATIVE NEGATIVE Final    Comment: (NOTE) The Xpert Xpress SARS-CoV-2/FLU/RSV plus assay is intended as an aid in the diagnosis of influenza from Nasopharyngeal swab specimens and should not be used as a sole basis for  treatment. Nasal washings and aspirates are unacceptable for Xpert Xpress  SARS-CoV-2/FLU/RSV testing.  Fact Sheet for Patients: BloggerCourse.com  Fact Sheet for Healthcare Providers: SeriousBroker.it  This test is not yet approved or cleared by the [place]  FDA and has been authorized for detection and/or diagnosis of SARS-CoV-2 by FDA under an Emergency Use Authorization (EUA). This EUA will remain in effect (meaning this test can be used) for the duration of the COVID-19 declaration under Section 564(b)(1) of the Act, 21 U.S.C. section 360bbb-3(b)(1), unless the authorization is terminated or revoked.     Resp Syncytial Virus by PCR NEGATIVE NEGATIVE Final    Comment: (NOTE) Fact Sheet for Patients: BloggerCourse.com  Fact Sheet for Healthcare Providers: SeriousBroker.it  This test is not yet approved or cleared by the [place]  FDA and has been authorized for detection and/or diagnosis of SARS-CoV-2 by FDA under an Emergency Use Authorization (EUA). This EUA will remain in effect (meaning this test can be used) for the duration of the COVID-19 declaration under Section 564(b)(1) of the Act, 21 U.S.C. section 360bbb-3(b)(1), unless the authorization is terminated or revoked.  Performed at [facility], [address], [place], [place] [address]     RADIOLOGY STUDIES/RESULTS: VAS US  LOWER EXTREMITY VENOUS (DVT) Result Date: 02/02/2024  Lower Venous DVT Study Patient Name:  LENNIN OSMOND Martire  Date of Exam:   02/02/2024 Medical Rec #: [id number]        Accession #:    [id number] Date of Birth: 24-May-1941       Patient Gender: M Patient Age:   72 years Exam Location:  [facility] Procedure:      VAS US  LOWER EXTREMITY VENOUS (DVT) Referring Phys: DONALDA APPLEBAUM --------------------------------------------------------------------------------  Indications: Swelling, and ++ Covid.  Risk Factors: COPD, CAD. Comparison Study: 03/03/2018- LLE  negative Performing Technologist: Ricka Sturdivant-Jones RDMS, RVT  Examination Guidelines: A complete evaluation includes B-mode imaging, spectral Doppler, color Doppler, and power Doppler as needed of all accessible portions of each vessel. Bilateral testing is considered an integral part of a complete examination. Limited examinations for reoccurring indications may be performed as noted. The reflux portion of the exam is performed with the patient in reverse Trendelenburg.  +---------+---------------+---------+-----------+----------+--------------+ RIGHT    CompressibilityPhasicitySpontaneityPropertiesThrombus Aging +---------+---------------+---------+-----------+----------+--------------+ CFV      Full           Yes      Yes                                 +---------+---------------+---------+-----------+----------+--------------+ SFJ      Full                                                        +---------+---------------+---------+-----------+----------+--------------+ FV Prox  Full                                                        +---------+---------------+---------+-----------+----------+--------------+ FV Mid   Full                                                        +---------+---------------+---------+-----------+----------+--------------+  FV DistalFull                                                        +---------+---------------+---------+-----------+----------+--------------+ PFV      Full                                                        +---------+---------------+---------+-----------+----------+--------------+ POP      Full           Yes      Yes                                 +---------+---------------+---------+-----------+----------+--------------+ PTV      Full                                                        +---------+---------------+---------+-----------+----------+--------------+ PERO     Full                                                         +---------+---------------+---------+-----------+----------+--------------+   +---------+---------------+---------+-----------+----------+--------------+ LEFT     CompressibilityPhasicitySpontaneityPropertiesThrombus Aging +---------+---------------+---------+-----------+----------+--------------+ CFV      Full           Yes      Yes                                 +---------+---------------+---------+-----------+----------+--------------+ SFJ      Full                                                        +---------+---------------+---------+-----------+----------+--------------+ FV Prox  Full                                                        +---------+---------------+---------+-----------+----------+--------------+ FV Mid   Full                                                        +---------+---------------+---------+-----------+----------+--------------+ FV DistalFull                                                        +---------+---------------+---------+-----------+----------+--------------+  PFV      Full                                                        +---------+---------------+---------+-----------+----------+--------------+ POP      Full           Yes      Yes                                 +---------+---------------+---------+-----------+----------+--------------+ PTV      Full                                                        +---------+---------------+---------+-----------+----------+--------------+ PERO     Full                                                        +---------+---------------+---------+-----------+----------+--------------+     Summary: BILATERAL: - No evidence of deep vein thrombosis seen in the lower extremities, bilaterally. -No evidence of popliteal cyst, bilaterally.   *See table(s) above for measurements and observations. Electronically  signed by Gaile New MD on 02/02/2024 at 6:11:05 PM.    Final       LOS: 3 days   Donalda Applebaum, MD  [facility]    To contact the attending provider between 7A-7P or the covering provider during after hours 7P-7A, please log into the web site [email or web address] and access using universal [place] password for that web site. If you do not have the password, please call the hospital operator.  02/04/2024, 9:37 AM

## 2024-02-04 NOTE — Plan of Care (Signed)

## 2024-02-04 NOTE — TOC Progression Note (Signed)
 Transition of Care [facility]) - Progression Note    Patient Details  Name: TERRANCE USERY MRN: [id number] Date of Birth: [birth year]  Transition of [facility]) CM/SW Contact  Nola Devere Hands, RN Phone Number: 02/04/2024, 11:56 AM  Clinical Narrative:    Case manager received call from Jon Staff for [facility] concerning Patient. He is active with them for Nursing. Patient can only have Male Nurses due to his inappropriate behavior with male staff. [facility] does not have male PT or OT staff therefore patient will not be able to receive those services. [name] is aware of situation. Jon states that her office will be contacting patient's primary Doctor regarding this as well. CM has updated Hospitalist.    Expected Discharge Plan: Home w Home Health Services Barriers to Discharge: Continued Medical Work up               Expected Discharge Plan and Services   Discharge Planning Services: CM Consult   Living arrangements for the past 2 months: Single Family Home                                       Social Drivers of Health (SDOH) Interventions SDOH Screenings   Food Insecurity: No Food Insecurity (02/01/2024)  Housing: Low Risk  (02/01/2024)  Transportation Needs: No Transportation Needs (02/01/2024)  Utilities: Not At Risk (02/01/2024)  Alcohol  Screen: Low Risk  (01/17/2024)  Depression (PHQ2-9): Low Risk  (01/17/2024)  Recent Concern: Depression (PHQ2-9) - High Risk (01/03/2024)  Financial Resource Strain: Low Risk  (01/17/2024)  Physical Activity: Sufficiently Active (01/17/2024)  Social Connections: Moderately Integrated (02/01/2024)  Stress: No Stress Concern Present (01/17/2024)  Tobacco Use: Low Risk  (01/31/2024)  Health Literacy: Adequate Health Literacy (01/17/2024)    Readmission Risk Interventions    12/22/2023    5:01 PM 10/20/2023   11:54 AM  Readmission Risk Prevention Plan  Transportation Screening Complete Complete  PCP or Specialist  Appt within 5-7 Days  Complete  PCP or Specialist Appt within 3-5 Days Complete   Home Care Screening  Complete  Medication Review (RN CM)  Complete  HRI or Home Care Consult Complete   Palliative Care Screening Not Applicable   Medication Review (RN Care Manager) Complete

## 2024-02-04 NOTE — Progress Notes (Signed)
 Physical Therapy Treatment Patient Details Name: Wayne Booth MRN: [id number] DOB: 06-20-1941 Today's Date: 02/04/2024   History of Present Illness 83 y.o. 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  Pt admitted on 01/31/24 with COVID 19 PNA. Pt with admission last month to [facility] with CHF exacerbation.    PT Comments  Pt progressing towards goals. Currently supervision for sit to stand and gait with RW. Re-iterated education on pacing activities and purse lipped breathing with gait. Pt requires 5 L o2 via [place] for mobility due to drop to low 80's on 4L. Due to pt current functional status, home set up and available assistance at home recommending skilled physical therapy services 3x/week in order to address strength, balance and functional mobility to decrease risk for falls, injury and re-hospitalization.       If plan is discharge home, recommend the following: A little help with walking and/or transfers;Assistance with cooking/housework;Assist for transportation;Help with stairs or ramp for entrance;Supervision due to cognitive status     Equipment Recommendations  None recommended by PT       Precautions / Restrictions Precautions Precautions: Fall Recall of Precautions/Restrictions: Impaired Precaution/Restrictions Comments: Watch O2/HR Restrictions Weight Bearing Restrictions Per Provider Order: No     Mobility  Bed Mobility   General bed mobility comments: OOB in recliner    Transfers Overall transfer level: Needs assistance Equipment used: Rolling walker (2 wheels) Transfers: Sit to/from Stand Sit to Stand: Supervision   Step pivot transfers: Supervision       General transfer comment: supervision for sit to stand and transfer    Ambulation/Gait Ambulation/Gait assistance:  Supervision Gait Distance (Feet): 74 Feet Assistive device: Rolling walker (2 wheels) Gait Pattern/deviations: Step-through pattern, Decreased stride length Gait velocity: decreased Gait velocity interpretation: 1.31 - 2.62 ft/sec, indicative of limited community ambulator   General Gait Details: Good cadence, cues for pursed lip breathing, pt able to self monitor for activity tolerance and verbalize when he needed to turn around       Balance Overall balance assessment: Needs assistance   Sitting balance-Leahy Scale: Good Sitting balance - Comments: seated in recliner without back support   Standing balance support: Bilateral upper extremity supported Standing balance-Leahy Scale: Poor Standing balance comment: reliant on UE support        Communication Communication Communication: Impaired Factors Affecting Communication: Hearing impaired  Cognition Arousal: Alert Behavior During Therapy: WFL for tasks assessed/performed   PT - Cognitive impairments: No apparent impairments   Following commands: Intact      Cueing Cueing Techniques: Verbal cues     General Comments General comments (skin integrity, edema, etc.): Pt with good pleth in low 80's on 4L o2 via [place]. Increased to 5L. Pt assisted with donning new underwear on request.  HR 120 bpm during activity      Pertinent Vitals/Pain Pain Assessment Pain Assessment: No/denies pain     PT Goals (current goals can now be found in the care plan section) Acute Rehab PT Goals Patient Stated Goal: to go home PT Goal Formulation: With patient Time For Goal Achievement: 02/16/24 Potential to Achieve Goals: Good Progress towards PT goals: Progressing toward goals    Frequency    Min 2X/week      PT Plan  Continue with current POC        AM-PAC PT  6 Clicks Mobility   Outcome Measure  Help needed turning from your back to your side while in a flat bed without using bedrails?: A Little Help needed moving  from lying on your back to sitting on the side of a flat bed without using bedrails?: A Little Help needed moving to and from a bed to a chair (including a wheelchair)?: A Little Help needed standing up from a chair using your arms (e.g., wheelchair or bedside chair)?: A Little Help needed to walk in hospital room?: A Little Help needed climbing 3-5 steps with a railing? : A Little 6 Click Score: 18    End of Session Equipment Utilized During Treatment: Gait belt;Oxygen  Activity Tolerance: Patient tolerated treatment well Patient left: with call bell/phone within reach;in chair;with family/visitor present Nurse Communication: Mobility status PT Visit Diagnosis: Other abnormalities of gait and mobility (R26.89)     Time: 8475-8449 PT Time Calculation (min) (ACUTE ONLY): 26 min  Charges:    $Therapeutic Activity: 23-37 mins PT General Charges $$ ACUTE PT VISIT: 1 Visit                     Wayne Booth, DPT, CLT  Acute Rehabilitation Services Office: [phone] (Secure chat preferred)    Wayne Booth 02/04/2024, 3:56 PM

## 2024-02-04 NOTE — Plan of Care (Signed)
  Problem: Education: Goal: Knowledge of risk factors and measures for prevention of condition will improve Outcome: Progressing   Problem: Coping: Goal: Psychosocial and spiritual needs will be supported Outcome: Progressing   Problem: Respiratory: Goal: Will maintain a patent airway Outcome: Progressing   Problem: Education: Goal: Knowledge of General Education information will improve Description: Including pain rating scale, medication(s)/side effects and non-pharmacologic comfort measures Outcome: Progressing   Problem: Health Behavior/Discharge Planning: Goal: Ability to manage health-related needs will improve Outcome: Progressing   Problem: Clinical Measurements: Goal: Ability to maintain clinical measurements within normal limits will improve Outcome: Progressing

## 2024-02-05 DIAGNOSIS — N179 Acute kidney failure, unspecified: Secondary | ICD-10-CM | POA: Diagnosis not present

## 2024-02-05 DIAGNOSIS — N182 Chronic kidney disease, stage 2 (mild): Secondary | ICD-10-CM | POA: Diagnosis not present

## 2024-02-05 LAB — CULTURE, BLOOD (ROUTINE X 2)
Culture: NO GROWTH
Culture: NO GROWTH
Special Requests: ADEQUATE

## 2024-02-05 LAB — BASIC METABOLIC PANEL WITH GFR
Anion gap: 8 (ref 5–15)
BUN: 27 mg/dL — ABNORMAL HIGH (ref 8–23)
CO2: 27 mmol/L (ref 22–32)
Calcium: 8.5 mg/dL — ABNORMAL LOW (ref 8.9–10.3)
Chloride: 98 mmol/L (ref 98–111)
Creatinine, Ser: 1.06 mg/dL (ref 0.61–1.24)
GFR, Estimated: >60 mL/min (ref >60)
Glucose, Bld: 102 mg/dL — ABNORMAL HIGH (ref 70–99)
Potassium: 4.5 mmol/L (ref 3.5–5.1)
Sodium: 133 mmol/L — ABNORMAL LOW (ref 135–145)

## 2024-02-05 LAB — C-REACTIVE PROTEIN: CRP: 1.1 mg/dL — ABNORMAL HIGH (ref <1.0)

## 2024-02-05 MED ORDER — GERHARDT'S BUTT CREAM
TOPICAL_CREAM | Freq: Two times a day (BID) | CUTANEOUS | Status: DC
  Filled 2024-02-05: qty 60

## 2024-02-05 MED ORDER — GUAIFENESIN-DM 100-10 MG/5ML PO SYRP: 10.0000 mL | ORAL_SOLUTION | Freq: Four times a day (QID) | ORAL | Status: DC | PRN

## 2024-02-05 NOTE — Plan of Care (Signed)
   Problem: Education: Goal: Knowledge of risk factors and measures for prevention of condition will improve Outcome: Progressing   Problem: Coping: Goal: Psychosocial and spiritual needs will be supported Outcome: Progressing   Problem: Respiratory: Goal: Will maintain a patent airway Outcome: Progressing   Problem: Education: Goal: Knowledge of General Education information will improve Description: Including pain rating scale, medication(s)/side effects and non-pharmacologic comfort measures Outcome: Progressing

## 2024-02-05 NOTE — Plan of Care (Signed)

## 2024-02-05 NOTE — Consult Note (Addendum)
 WOC Nurse Consult Note: patient familiar to WOC team from previous admission with Stage 3 Pressure Injury L buttock  Reason for Consult: L buttock wound  Wound type: Healing Stage 3 Pressure Injury L buttock; appears red and moist at this time  Pressure Injury POA: Yes Measurement: see nursing flowsheet  Wound bed: red moist; some scattered dark hemorrhagic appearing tissue  Drainage (amount, consistency, odor) see nursing flowsheet  Periwound: skin of buttocks with purplish discoloration that appears to be chronic tissue damage likely from chronic pressure (patient relates sleeping in recliner at home to bedside nurse) Patient complains of pain to buttock however this may be more related to chronic pressure than actual wound.     Dressing procedure/placement/frequency: Cleanse buttocks with soap and water , dry and apply Xeroform gauze ([place] [phone]) to wound bed daily.  Secure with silicone foam.    Will write for Gerhardt's Butt Cream 2 times a day to surrounding intact skin. Will also order a pressure redistribution cushion chair pad for when up in chair Gerlean [phone].   POC discussed with primary nurse.  Appreciate M. Lamicchane, RN assistance with this consult.  WOC team will not follow. Re-consult if further needs arise.   Thank you,    Powell Bar MSN, RN-BC, [employer]

## 2024-02-05 NOTE — Progress Notes (Signed)
 PROGRESS NOTE        PATIENT DETAILS Name: Wayne Booth Age: 83 y.o. Sex: male Date of Birth: 06/03/1941 Admit Date: 01/31/2024 Admitting Physician Donalda CHRISTELLA Applebaum, MD ERE:Ylwuzm, Garnette KIDD, MD  Brief Summary: Patient is a 83 y.o.  male with history of HTN, chronic HFpEF, ILD on 5 L of oxygen  at home, essential tremor, renal cell carcinoma s/p left nephrectomy-present with several days history of weakness/cough/shortness of breath-found to have presumed COVID-19 infection pneumonia   (wife who this MD spoke to on 8/4-with URI symptoms-presumed that she had the flu-but has never been tested)  Significant events: 8/4>> admit to [facility]  Significant studies: 8/4>> CXR: Bilateral patchy airspace opacities. 8/6>> B/L lower extremity Doppler: No DVT.  Significant microbiology data: 8/4>> blood cultures: No growth 8/4>> COVID PCR: Positive (CT value 15.7) 8/4>> influenza/RSV PCR: Negative  Procedures: None  Consults: None  Subjective:  Patient in bed, appears comfortable, denies any headache, no fever, no chest pain or pressure, improving shortness of breath , no abdominal pain. No new focal weakness.   Objective: Vitals: Blood pressure 127/72, pulse (!) 59, temperature 98 F (36.7 C), temperature source Oral, resp. rate 20, height 5' 11 (1.803 m), weight 103.8 kg, SpO2 98%.   Exam:  Awake Alert, No new F.N deficits, Normal affect [place].AT,PERRAL Supple Neck, No JVD,   Symmetrical Chest wall movement, Good air movement bilaterally, CTAB RRR,No Gallops, Rubs or new Murmurs,  +ve B.Sounds, Abd Soft, No tenderness,   No Cyanosis, Clubbing or edema    Assessment/Plan: COVID 19 pneumonia Overall essentially unchanged Stable-on 4-5 L of oxygen  (5 L at baseline) CRP down trended significantly Completed remdesivir  x 3 days 8/7 Switch to oral prednisone  8/8 Some trace edema today-1 dose of IV Lasix  Continue to mobilize with PT/OT Encourage use of  incentive spirometry/flutter valve Hopefully home in the next 1-days depending on clinical improvement.  Interstitial lung disease COPD Chronic hypoxic respiratory failure on 5 L of oxygen  at baseline Continue bronchodilators and supportive care.  Chronic HFpEF Euvolemic on exam-though has some mild trace leg edema today 1 dose of IV Lasix  Resume Aldactone  today Start usual dosing of oral Lasix  8/9.  AKI on CKD 2 AKI likely hemodynamically mediated Resolved with supportive care.  PAF Maintaining sinus rhythm Per prior discharge summary on 6/27-patient has declined anticoagulation in the past.  History of pulmonary embolism On prophylactic Lovenox  currently Apparently completed several months of anticoagulation in the past D-dimer minimally elevated in the setting of COVID-19 infection-Doppler negative for DVT-doubt further workup required.  OSA Not on CPAP  History of renal cell carcinoma-s/p left nephrectomy Monitor renal function  History of prostate cancer s/p seed implantation.  History of PTSD/anxiety Stable BuSpar /Lexapro /trazodone .  Class 1 Obesity: Estimated body mass index is 31.92 kg/m as calculated from the following:   Height as of this encounter: 5' 11 (1.803 m).   Weight as of this encounter: 103.8 kg.   Code status:   Code Status: Full Code   DVT Prophylaxis:enoxaparin  (LOVENOX ) injection 40 mg Start: 01/31/24 1600    Family Communication: Spouse-Jody Weisensel-[phone] updated 8/8   Disposition Plan: Status is: Observation The patient will require care spanning > 2 midnights and should be moved to inpatient because: Severe bradycardia of illness   Planned Discharge Destination:Home health   Diet: Diet Order  Diet regular Room service appropriate? Yes; Fluid consistency: Thin  Diet effective now                     Antimicrobial agents: Anti-infectives (From admission, onward)    Start     Dose/Rate Route  Frequency Ordered Stop   02/02/24 1000  remdesivir  100 mg in sodium chloride  0.9 % 100 mL IVPB       Placed in Followed by Linked Group   100 mg 200 mL/hr over 30 Minutes Intravenous Daily 02/01/24 0910 02/03/24 1056   02/01/24 1015  remdesivir  200 mg in sodium chloride  0.9% 250 mL IVPB       Placed in Followed by Linked Group   200 mg 580 mL/hr over 30 Minutes Intravenous Once 02/01/24 0910 02/01/24 1313   01/31/24 1345  azithromycin  (ZITHROMAX ) 500 mg in sodium chloride  0.9 % 250 mL IVPB        500 mg 250 mL/hr over 60 Minutes Intravenous  Once 01/31/24 1332 01/31/24 1718   01/31/24 1345  cefTRIAXone  (ROCEPHIN ) 2 g in sodium chloride  0.9 % 100 mL IVPB        2 g 200 mL/hr over 30 Minutes Intravenous  Once 01/31/24 1332 01/31/24 1504        MEDICATIONS: Scheduled Meds:  arformoterol   15 mcg Nebulization BID   budesonide   0.25 mg Nebulization BID   enoxaparin  (LOVENOX ) injection  40 mg Subcutaneous Q24H   escitalopram   20 mg Oral Daily   furosemide   40 mg Oral Daily   gabapentin   300 mg Oral TID   predniSONE   40 mg Oral Q breakfast   revefenacin   175 mcg Nebulization Daily   sodium chloride  flush  3 mL Intravenous Q12H   spironolactone   25 mg Oral Daily   traZODone   50 mg Oral QHS   Continuous Infusions:   PRN Meds:.acetaminophen  **OR** acetaminophen , busPIRone , ipratropium-albuterol , polyethylene glycol   I have personally reviewed following labs and imaging studies  LABORATORY DATA:   Data Review:   Inpatient Medications  Scheduled Meds:  arformoterol   15 mcg Nebulization BID   budesonide   0.25 mg Nebulization BID   enoxaparin  (LOVENOX ) injection  40 mg Subcutaneous Q24H   escitalopram   20 mg Oral Daily   furosemide   40 mg Oral Daily   gabapentin   300 mg Oral TID   predniSONE   40 mg Oral Q breakfast   revefenacin   175 mcg Nebulization Daily   sodium chloride  flush  3 mL Intravenous Q12H   spironolactone   25 mg Oral Daily   traZODone   50 mg Oral QHS    Continuous Infusions: PRN Meds:.acetaminophen  **OR** acetaminophen , busPIRone , ipratropium-albuterol , polyethylene glycol  DVT Prophylaxis  enoxaparin  (LOVENOX ) injection 40 mg Start: 01/31/24 1600       Recent Labs  Lab 01/31/24 0950 01/31/24 1423 02/01/24 0606 02/02/24 0607 02/03/24 0803  WBC 9.3  --  7.0 10.7* 10.3  HGB 14.9 15.0 13.4 12.9* 13.1  HCT 45.0 44.0 39.3 38.0* 40.0  PLT 290  --  246 259 272  MCV 91.5  --  88.3 90.0 92.0  MCH 30.3  --  30.1 30.6 30.1  MCHC 33.1  --  34.1 33.9 32.8  RDW 14.1  --  13.8 14.1 14.3  LYMPHSABS 2.7  --   --   --   --   MONOABS 1.7*  --   --   --   --   EOSABS 0.1  --   --   --   --  BASOSABS 0.0  --   --   --   --     Recent Labs  Lab 01/31/24 0950 01/31/24 0959 01/31/24 1010 01/31/24 1238 01/31/24 1423 01/31/24 1840 01/31/24 2030 02/01/24 0606 02/01/24 1056 02/02/24 0607 02/03/24 0803 02/04/24 0649 02/05/24 0628  NA 132*  --   --   --    < >  --   --  133*  --  133* 133* 137 133*  K 3.6  --   --   --    < >  --   --  4.1  --  3.8 5.0 5.5* 4.5  CL 96*  --   --   --   --   --   --  101  --  99 99 99 98  CO2 23  --   --   --   --   --   --  26  --  24 27 29 27   ANIONGAP 13  --   --   --   --   --   --  6  --  10 7 9 8   GLUCOSE 129*  --   --   --   --   --   --  146*  --  200* 126* 164* 102*  BUN 13  --   --   --   --   --   --  14  --  19 19 25* 27*  CREATININE 1.44*  --   --   --   --   --   --  1.11  --  1.07 0.98 1.15 1.06  AST 32  --   --   --   --   --   --  23  --  24  --   --   --   ALT 20  --   --   --   --   --   --  18  --  18  --   --   --   ALKPHOS 68  --   --   --   --   --   --  55  --  49  --   --   --   BILITOT 1.7*  --   --   --   --   --   --  0.4  --  0.5  --   --   --   ALBUMIN  3.0*  --   --   --   --   --   --  2.6*  --  2.5*  --   --   --   CRP  --   --   --   --   --   --   --   --  12.6* 7.6* 3.7* 2.4* 1.1*  DDIMER  --   --   --   --   --   --   --   --  1.67* 1.28*  --   --   --    PROCALCITON  --   --   --   --   --  0.14  --  0.11  --   --   --   --   --   LATICACIDVEN  --  3.0*  --  3.2*  --  2.8* 1.8  --   --   --   --   --   --   INR 1.2  --   --   --   --   --   --   --   --   --   --   --   --  BNP  --   --  52.7  --   --   --   --   --   --   --   --   --   --   CALCIUM 8.6*  --   --   --   --   --   --  8.4*  --  8.3* 8.4* 8.6* 8.5*   < > = values in this interval not displayed.      Recent Labs  Lab 01/31/24 0950 01/31/24 0959 01/31/24 1010 01/31/24 1238 01/31/24 1840 01/31/24 2030 02/01/24 0606 02/01/24 1056 02/02/24 0607 02/03/24 0803 02/04/24 0649 02/05/24 0628  CRP  --   --   --   --   --   --   --  12.6* 7.6* 3.7* 2.4* 1.1*  DDIMER  --   --   --   --   --   --   --  1.67* 1.28*  --   --   --   PROCALCITON  --   --   --   --  0.14  --  0.11  --   --   --   --   --   LATICACIDVEN  --  3.0*  --  3.2* 2.8* 1.8  --   --   --   --   --   --   INR 1.2  --   --   --   --   --   --   --   --   --   --   --   BNP  --   --  52.7  --   --   --   --   --   --   --   --   --   CALCIUM 8.6*  --   --   --   --   --  8.4*  --  8.3* 8.4* 8.6* 8.5*    --------------------------------------------------------------------------------------------------------------- Lab Results  Component Value Date   CHOL 117 04/13/2022   HDL 31.70 (L) 04/13/2022   LDLCALC 59 04/13/2022   LDLDIRECT 62.0 09/07/2006   TRIG 127.0 04/13/2022   CHOLHDL 4 04/13/2022    Lab Results  Component Value Date   HGBA1C 5.7 04/13/2022   No results for input(s): TSH, T4TOTAL, FREET4, T3FREE, THYROIDAB in the last 72 hours. No results for input(s): VITAMINB12, FOLATE, FERRITIN, TIBC, IRON, RETICCTPCT in the last 72 hours. ------------------------------------------------------------------------------------------------------------------ Cardiac Enzymes No results for input(s): CKMB, TROPONINI, MYOGLOBIN in the last 168 hours.  Invalid input(s):  CK  Micro Results Recent Results (from the past 240 hours)  Culture, blood (Routine x 2)     Status: None   Collection Time: 01/31/24  9:45 AM   Specimen: BLOOD  Result Value Ref Range Status   Specimen Description BLOOD SITE NOT SPECIFIED  Final   Special Requests   Final    BOTTLES DRAWN AEROBIC AND ANAEROBIC Blood Culture results may not be optimal due to an inadequate volume of blood received in culture bottles   Culture   Final    NO GROWTH 5 DAYS Performed at [facility], [address] [place], [place] [address]    Report Status 02/05/2024 FINAL  Final  Culture, blood (Routine x 2)     Status: None   Collection Time: 01/31/24  9:50 AM   Specimen: BLOOD  Result Value Ref Range Status   Specimen Description BLOOD SITE NOT SPECIFIED  Final  Special Requests   Final    BOTTLES DRAWN AEROBIC AND ANAEROBIC Blood Culture adequate volume   Culture   Final    NO GROWTH 5 DAYS Performed at [facility], [address] [place], [place] [address]    Report Status 02/05/2024 FINAL  Final  Resp panel by RT-PCR (RSV, Flu A&B, Covid) Anterior Nasal Swab     Status: Abnormal   Collection Time: 01/31/24 10:08 AM   Specimen: Anterior Nasal Swab  Result Value Ref Range Status   SARS Coronavirus 2 by RT PCR POSITIVE (A) NEGATIVE Final   Influenza A by PCR NEGATIVE NEGATIVE Final   Influenza B by PCR NEGATIVE NEGATIVE Final    Comment: (NOTE) The Xpert Xpress SARS-CoV-2/FLU/RSV plus assay is intended as an aid in the diagnosis of influenza from Nasopharyngeal swab specimens and should not be used as a sole basis for treatment. Nasal washings and aspirates are unacceptable for Xpert Xpress SARS-CoV-2/FLU/RSV testing.  Fact Sheet for Patients: BloggerCourse.com  Fact Sheet for Healthcare Providers: SeriousBroker.it  This test is not yet approved or cleared by the [place]  FDA and has been authorized for detection  and/or diagnosis of SARS-CoV-2 by FDA under an Emergency Use Authorization (EUA). This EUA will remain in effect (meaning this test can be used) for the duration of the COVID-19 declaration under Section 564(b)(1) of the Act, 21 U.S.C. section 360bbb-3(b)(1), unless the authorization is terminated or revoked.     Resp Syncytial Virus by PCR NEGATIVE NEGATIVE Final    Comment: (NOTE) Fact Sheet for Patients: BloggerCourse.com  Fact Sheet for Healthcare Providers: SeriousBroker.it  This test is not yet approved or cleared by the [place]  FDA and has been authorized for detection and/or diagnosis of SARS-CoV-2 by FDA under an Emergency Use Authorization (EUA). This EUA will remain in effect (meaning this test can be used) for the duration of the COVID-19 declaration under Section 564(b)(1) of the Act, 21 U.S.C. section 360bbb-3(b)(1), unless the authorization is terminated or revoked.  Performed at [facility], [address], [place], [place] [address]     Radiology Reports  No results found.    Signature  -   Lavada Stank M.D on 02/05/2024 at 9:51 AM   -  To page go to www.[email or web address]

## 2024-02-05 NOTE — Plan of Care (Signed)

## 2024-02-06 DIAGNOSIS — N179 Acute kidney failure, unspecified: Secondary | ICD-10-CM | POA: Diagnosis not present

## 2024-02-06 DIAGNOSIS — N182 Chronic kidney disease, stage 2 (mild): Secondary | ICD-10-CM | POA: Diagnosis not present

## 2024-02-06 LAB — CBC WITH DIFFERENTIAL/PLATELET
Abs Immature Granulocytes: 0.7 K/uL — ABNORMAL HIGH (ref 0.00–0.07)
Basophils Absolute: 0 K/uL (ref 0.0–0.1)
Basophils Relative: 0 %
Eosinophils Absolute: 0.3 K/uL (ref 0.0–0.5)
Eosinophils Relative: 2 %
HCT: 43.6 % (ref 39.0–52.0)
Hemoglobin: 14.4 g/dL (ref 13.0–17.0)
Lymphocytes Relative: 9 %
Lymphs Abs: 1.5 K/uL (ref 0.7–4.0)
MCH: 30.2 pg (ref 26.0–34.0)
MCHC: 33 g/dL (ref 30.0–36.0)
MCV: 91.4 fL (ref 80.0–100.0)
Metamyelocytes Relative: 2 %
Monocytes Absolute: 1 K/uL (ref 0.1–1.0)
Monocytes Relative: 6 %
Myelocytes: 2 %
Neutro Abs: 13.3 K/uL — ABNORMAL HIGH (ref 1.7–7.7)
Neutrophils Relative %: 79 %
Platelets: 363 K/uL (ref 150–400)
RBC: 4.77 MIL/uL (ref 4.22–5.81)
RDW: 14.4 % (ref 11.5–15.5)
WBC: 16.8 K/uL — ABNORMAL HIGH (ref 4.0–10.5)
nRBC: 0.2 % (ref 0.0–0.2)

## 2024-02-06 LAB — BASIC METABOLIC PANEL WITH GFR
Anion gap: 10 (ref 5–15)
BUN: 25 mg/dL — ABNORMAL HIGH (ref 8–23)
CO2: 27 mmol/L (ref 22–32)
Calcium: 8.6 mg/dL — ABNORMAL LOW (ref 8.9–10.3)
Chloride: 97 mmol/L — ABNORMAL LOW (ref 98–111)
Creatinine, Ser: 1.09 mg/dL (ref 0.61–1.24)
GFR, Estimated: >60 mL/min (ref >60)
Glucose, Bld: 107 mg/dL — ABNORMAL HIGH (ref 70–99)
Potassium: 3.9 mmol/L (ref 3.5–5.1)
Sodium: 134 mmol/L — ABNORMAL LOW (ref 135–145)

## 2024-02-06 LAB — MAGNESIUM: Magnesium: 2.1 mg/dL (ref 1.7–2.4)

## 2024-02-06 MED ORDER — METHYLPREDNISOLONE 4 MG PO TBPK: ORAL_TABLET | ORAL | 0 refills | Status: DC

## 2024-02-06 MED ORDER — PREDNISONE 20 MG PO TABS: 20.0000 mg | ORAL_TABLET | Freq: Every day | ORAL | Status: DC

## 2024-02-06 NOTE — Discharge Summary (Signed)
 Wayne Booth FMW:[id number] DOB: May 10, 1941 DOA: 01/31/2024  PCP: Katrinka Garnette KIDD, MD  Admit date: 01/31/2024  Discharge date: 02/06/2024  Admitted From: Home   Disposition:  Home   Recommendations for Outpatient Follow-up:   Follow up with PCP in 1-2 weeks  PCP Please obtain BMP/CBC, 2 view CXR in 1week,  (see Discharge instructions)   PCP Please follow up on the following pending results:    Home Health: PT, RN if qualifies   Equipment/Devices: as below  Consultations: None  Discharge Condition: Stable    CODE STATUS: Full    Diet Recommendation: Heart Healthy with 1.5 L fluid restriction    Chief Complaint  Patient presents with   Shortness of Breath     Brief history of present illness from the day of admission and additional interim summary     83 y.o.  male with history of HTN, chronic HFpEF, ILD on 5 L of oxygen  at home, essential tremor, renal cell carcinoma s/p left nephrectomy-present with several days history of weakness/cough/shortness of breath-found to have presumed COVID-19 infection pneumonia    (wife who this MD spoke to on 8/4-with URI symptoms-presumed that she had the flu-but has never been tested)   Significant events: 8/4>> admit to [facility]   Significant studies: 8/4>> CXR: Bilateral patchy airspace opacities. 8/6>> B/L lower extremity Doppler: No DVT.   Significant microbiology data: 8/4>> blood cultures: No growth 8/4>> COVID PCR: Positive (CT value 15.7) 8/4>> influenza/RSV PCR: Negative                                                                 Hospital Course   COVID 19 pneumonia with acute on chronic hypoxic respiratory failure Overall significantly improved Stable-on 4 L of oxygen  (5 L at baseline) CRP down trended significantly Completed remdesivir  x 3 days  8/7 Switch to oral prednisone  8/8, will get oral taper upon discharge Euvolemic now on exam Continue to mobilize with PT/OT Encourage use of incentive spirometry/flutter valve He is actually much better than he is at home he is currently on 4 L for the last 24 hours and completely symptom-free, he uses 5 L at home, will be discharged home with home PT and RN follow-up, oral steroid taper, requested to follow-up with PCP and primary pulmonologist within a week of discharge.   Interstitial lung disease COPD Chronic hypoxic respiratory failure on 5 L of oxygen  at baseline currently on 4 L and symptom-free Continue bronchodilators and supportive care.   Chronic HFpEF Euvolemic on exam-continue home regimen of diuretics, added fluid restriction on instructions   AKI on CKD 2 AKI likely hemodynamically mediated Resolved with supportive care.   PAF Maintaining sinus rhythm Per prior discharge summary on 6/27-patient has declined anticoagulation in the past.   History of pulmonary embolism  On prophylactic Lovenox  currently Apparently completed several months of anticoagulation in the past Lower extremity venous duplex negative here.   OSA Not on CPAP   History of renal cell carcinoma-s/p left nephrectomy No acute issues, stable here   History of prostate cancer s/p seed implantation.  PCP to monitor no acute issues.   History of PTSD/anxiety Stable BuSpar /Lexapro /trazodone .   Class 1 Obesity: Estimated body mass index is 31.92, follow-up with PCP    Discharge diagnosis     Principal Problem:   Acute renal failure superimposed on stage 2 chronic kidney disease (HCC) Active Problems:   Essential hypertension   Paroxysmal atrial fibrillation (HCC)   CKD (chronic kidney disease), stage II   Chronic pain syndrome   History of COPD   Generalized anxiety disorder   Obstructive sleep apnea   Hypothyroidism   Gout   History of prostate cancer-seed implantation 2010.  Dr.  Renda   History of renal cell carcinoma   Essential tremor   (HFpEF) heart failure with preserved ejection fraction (HCC)   History of pulmonary embolus (PE)   PTSD (post-traumatic stress disorder)   Post-COVID pulmonary fibrosis (HCC)   ILD (interstitial lung disease) (HCC)   Morbid obesity (HCC)   History of CAD (coronary artery disease)   Pneumonia due to COVID-19 virus    Discharge instructions    Discharge Instructions     Discharge wound care:   Complete by: As directed    Cleanse buttocks with soap and water , dry and apply Xeroform gauze ([place]son [phone]) to wound bed daily.  Secure with silicone foam.   Increase activity slowly   Complete by: As directed        Discharge Medications   Allergies as of 02/06/2024       Reactions   Ambien  [zolpidem ] Anxiety, Other (See Comments)   Anxiety and agitation when tried as sleeper in hospital aug 2019        Medication List     TAKE these medications    acetaminophen  325 MG tablet Commonly known as: TYLENOL  Take 2 tablets (650 mg total) by mouth every 6 (six) hours as needed for mild pain (pain score 1-3) or fever (or Fever >/= 101).   albuterol  (2.5 MG/3ML) 0.083% nebulizer solution Commonly known as: PROVENTIL  Take 3 mLs (2.5 mg total) by nebulization every 6 (six) hours as needed for wheezing or shortness of breath.   albuterol  108 (90 Base) MCG/ACT inhaler Commonly known as: VENTOLIN  HFA INHALE 2 PUFFS BY MOUTH EVERY 6 HOURS AS NEEDED FOR WHEEZING OR SHORTNESS OF BREATH   budesonide  0.25 MG/2ML nebulizer solution Commonly known as: PULMICORT  Take 2 mLs (0.25 mg total) by nebulization 2 (two) times daily.   busPIRone  5 MG tablet Commonly known as: BUSPAR  Take 5 mg by mouth at bedtime as needed (anxiety).   escitalopram  20 MG tablet Commonly known as: LEXAPRO  TAKE 1 TABLET BY MOUTH DAILY   furosemide  40 MG tablet Commonly known as: LASIX  Take 1 tablet (40 mg total) by mouth daily.   gabapentin  300 MG  capsule Commonly known as: NEURONTIN  TAKE 1 CAPSULE BY MOUTH 3 TIMES A DAY   ipratropium-albuterol  0.5-2.5 (3) MG/3ML Soln Commonly known as: DUONEB Take 3 mLs by nebulization every 6 (six) hours as needed.   Jardiance  10 MG Tabs tablet Generic drug: empagliflozin  Take 1 tablet (10 mg total) by mouth daily.   methylPREDNISolone  4 MG Tbpk tablet Commonly known as: MEDROL  DOSEPAK follow package directions   ondansetron  4 MG  tablet Commonly known as: ZOFRAN  Take 1 tablet (4 mg total) by mouth every 6 (six) hours as needed for nausea.   OXYGEN  Inhale 5 L into the lungs continuous.   spironolactone  25 MG tablet Commonly known as: ALDACTONE  Take 1 tablet (25 mg total) by mouth daily.   traMADol  50 MG tablet Commonly known as: ULTRAM  Take 50 mg by mouth every 6 (six) hours as needed for moderate pain (pain score 4-6).   traZODone  50 MG tablet Commonly known as: DESYREL  Take 1 tablet (50 mg total) by mouth at bedtime.               Discharge Care Instructions  (From admission, onward)           Start     Ordered   02/06/24 0000  Discharge wound care:       Comments: Cleanse buttocks with soap and water , dry and apply Xeroform gauze ([place]son [phone]) to wound bed daily.  Secure with silicone foam.   02/06/24 0930             Follow-up Information     [facility] [place] Follow up.   Contact information: [name] [place] [address] [phone]         Katrinka Garnette KIDD, MD. Schedule an appointment as soon as possible for a visit in 1 week(s).   Specialty: Family Medicine Why: And with your pulmonologist within a week of discharge Contact information: [address] [place] [address] [phone]                 Major procedures and Radiology Reports - PLEASE review detailed and final reports thoroughly  -       VAS US  LOWER EXTREMITY VENOUS (DVT) Result Date: 02/02/2024   Lower Venous DVT Study Patient Name:  WENDEL HOMEYER Lenhardt  Date of Exam:   02/02/2024 Medical Rec #: [id number]        Accession #:    [id number] Date of Birth: February 04, 1941       Patient Gender: M Patient Age:   83 years Exam Location:  [facility] Procedure:      VAS US  LOWER EXTREMITY VENOUS (DVT) Referring Phys: DONALDA APPLEBAUM --------------------------------------------------------------------------------  Indications: Swelling, and ++ Covid.  Risk Factors: COPD, CAD. Comparison Study: 03/03/2018- LLE negative Performing Technologist: Ricka Sturdivant-Jones RDMS, RVT  Examination Guidelines: A complete evaluation includes B-mode imaging, spectral Doppler, color Doppler, and power Doppler as needed of all accessible portions of each vessel. Bilateral testing is considered an integral part of a complete examination. Limited examinations for reoccurring indications may be performed as noted. The reflux portion of the exam is performed with the patient in reverse Trendelenburg.  +---------+---------------+---------+-----------+----------+--------------+ RIGHT    CompressibilityPhasicitySpontaneityPropertiesThrombus Aging +---------+---------------+---------+-----------+----------+--------------+ CFV      Full           Yes      Yes                                 +---------+---------------+---------+-----------+----------+--------------+ SFJ      Full                                                        +---------+---------------+---------+-----------+----------+--------------+  FV Prox  Full                                                        +---------+---------------+---------+-----------+----------+--------------+ FV Mid   Full                                                        +---------+---------------+---------+-----------+----------+--------------+ FV DistalFull                                                         +---------+---------------+---------+-----------+----------+--------------+ PFV      Full                                                        +---------+---------------+---------+-----------+----------+--------------+ POP      Full           Yes      Yes                                 +---------+---------------+---------+-----------+----------+--------------+ PTV      Full                                                        +---------+---------------+---------+-----------+----------+--------------+ PERO     Full                                                        +---------+---------------+---------+-----------+----------+--------------+   +---------+---------------+---------+-----------+----------+--------------+ LEFT     CompressibilityPhasicitySpontaneityPropertiesThrombus Aging +---------+---------------+---------+-----------+----------+--------------+ CFV      Full           Yes      Yes                                 +---------+---------------+---------+-----------+----------+--------------+ SFJ      Full                                                        +---------+---------------+---------+-----------+----------+--------------+ FV Prox  Full                                                        +---------+---------------+---------+-----------+----------+--------------+  FV Mid   Full                                                        +---------+---------------+---------+-----------+----------+--------------+ FV DistalFull                                                        +---------+---------------+---------+-----------+----------+--------------+ PFV      Full                                                        +---------+---------------+---------+-----------+----------+--------------+ POP      Full           Yes      Yes                                  +---------+---------------+---------+-----------+----------+--------------+ PTV      Full                                                        +---------+---------------+---------+-----------+----------+--------------+ PERO     Full                                                        +---------+---------------+---------+-----------+----------+--------------+     Summary: BILATERAL: - No evidence of deep vein thrombosis seen in the lower extremities, bilaterally. -No evidence of popliteal cyst, bilaterally.   *See table(s) above for measurements and observations. Electronically signed by Gaile New MD on 02/02/2024 at 6:11:05 PM.    Final    DG Chest Port 1 View if patient is in a treatment room. Result Date: 01/31/2024 CLINICAL DATA:  Suspected Sepsis EXAM: PORTABLE CHEST 1 VIEW COMPARISON:  Chest x-ray 12/31/2023 FINDINGS: The heart and mediastinal contours are unchanged. Atherosclerotic plaque. Persistent bilateral mid to lower lung zone airspace opacities of the lower lobes right upper lung zone patchy airspace opacity. No pulmonary edema. Persistent bilateral trace to small volume pleural effusions. No pneumothorax. No acute osseous abnormality. IMPRESSION: 1. Persistent bilateral mid to lower lung zone airspace opacities of the lower lobes right upper lung zone patchy airspace opacity. 2. Persistent trace to small volume pleural effusions. 3. Followup PA and lateral chest X-ray is recommended in 3-4 weeks following therapy to ensure resolution and exclude underlying malignancy. Electronically Signed   By: Morgane  Naveau M.D.   On: 01/31/2024 10:29    Micro Results     Recent Results (from the past 240 hours)  Culture, blood (Routine x 2)     Status: None   Collection Time: 01/31/24  9:45 AM   Specimen: BLOOD  Result Value Ref Range Status   Specimen Description BLOOD SITE NOT SPECIFIED  Final   Special Requests   Final    BOTTLES DRAWN AEROBIC AND ANAEROBIC Blood Culture  results may not be optimal due to an inadequate volume of blood received in culture bottles   Culture   Final    NO GROWTH 5 DAYS Performed at [facility], [address], [place], [place] [address]    Report Status 02/05/2024 FINAL  Final  Culture, blood (Routine x 2)     Status: None   Collection Time: 01/31/24  9:50 AM   Specimen: BLOOD  Result Value Ref Range Status   Specimen Description BLOOD SITE NOT SPECIFIED  Final   Special Requests   Final    BOTTLES DRAWN AEROBIC AND ANAEROBIC Blood Culture adequate volume   Culture   Final    NO GROWTH 5 DAYS Performed at [facility], [address], [place], [place] [address]    Report Status 02/05/2024 FINAL  Final  Resp panel by RT-PCR (RSV, Flu A&B, Covid) Anterior Nasal Swab     Status: Abnormal   Collection Time: 01/31/24 10:08 AM   Specimen: Anterior Nasal Swab  Result Value Ref Range Status   SARS Coronavirus 2 by RT PCR POSITIVE (A) NEGATIVE Final   Influenza A by PCR NEGATIVE NEGATIVE Final   Influenza B by PCR NEGATIVE NEGATIVE Final    Comment: (NOTE) The Xpert Xpress SARS-CoV-2/FLU/RSV plus assay is intended as an aid in the diagnosis of influenza from Nasopharyngeal swab specimens and should not be used as a sole basis for treatment. Nasal washings and aspirates are unacceptable for Xpert Xpress SARS-CoV-2/FLU/RSV testing.  Fact Sheet for Patients: BloggerCourse.com  Fact Sheet for Healthcare Providers: SeriousBroker.it  This test is not yet approved or cleared by the [place]  FDA and has been authorized for detection and/or diagnosis of SARS-CoV-2 by FDA under an Emergency Use Authorization (EUA). This EUA will remain in effect (meaning this test can be used) for the duration of the COVID-19 declaration under Section 564(b)(1) of the Act, 21 U.S.C. section 360bbb-3(b)(1), unless the authorization is terminated or revoked.     Resp Syncytial  Virus by PCR NEGATIVE NEGATIVE Final    Comment: (NOTE) Fact Sheet for Patients: BloggerCourse.com  Fact Sheet for Healthcare Providers: SeriousBroker.it  This test is not yet approved or cleared by the [place]  FDA and has been authorized for detection and/or diagnosis of SARS-CoV-2 by FDA under an Emergency Use Authorization (EUA). This EUA will remain in effect (meaning this test can be used) for the duration of the COVID-19 declaration under Section 564(b)(1) of the Act, 21 U.S.C. section 360bbb-3(b)(1), unless the authorization is terminated or revoked.  Performed at [facility], [address], [place], [place] [address]     Today   Subjective    Gina Costilla today has no headache,no chest abdominal pain,no new weakness tingling or numbness, feels much better    Objective   Blood pressure 129/79, pulse 63, temperature 97.7 F (36.5 C), temperature source Oral, resp. rate (!) 22, height 5' 11 (1.803 m), weight 103.8 kg, SpO2 99%.   Intake/Output Summary (Last 24 hours) at 02/06/2024 0931 Last data filed at 02/06/2024 0500 Gross per 24 hour  Intake 800 ml  Output 2550 ml  Net -1750 ml    Exam  Awake Alert, No new F.N deficits,    [place].AT,PERRAL Supple Neck,   Symmetrical Chest wall  movement, Good air movement bilaterally, CTAB RRR,No Gallops,   +ve B.Sounds, Abd Soft, Non tender,  No Cyanosis, Clubbing or edema    Data Review   Recent Labs  Lab 01/31/24 0950 01/31/24 1423 02/01/24 0606 02/02/24 0607 02/03/24 0803 02/06/24 0752  WBC 9.3  --  7.0 10.7* 10.3 16.8*  HGB 14.9 15.0 13.4 12.9* 13.1 14.4  HCT 45.0 44.0 39.3 38.0* 40.0 43.6  PLT 290  --  246 259 272 363  MCV 91.5  --  88.3 90.0 92.0 91.4  MCH 30.3  --  30.1 30.6 30.1 30.2  MCHC 33.1  --  34.1 33.9 32.8 33.0  RDW 14.1  --  13.8 14.1 14.3 14.4  LYMPHSABS 2.7  --   --   --   --  1.5  MONOABS 1.7*  --   --   --   --  1.0  EOSABS  0.1  --   --   --   --  0.3  BASOSABS 0.0  --   --   --   --  0.0    Recent Labs  Lab 01/31/24 0950 01/31/24 0959 01/31/24 1010 01/31/24 1238 01/31/24 1423 01/31/24 1840 01/31/24 2030 02/01/24 0606 02/01/24 1056 02/02/24 0607 02/03/24 0803 02/04/24 0649 02/05/24 0628 02/06/24 0752  NA 132*  --   --   --    < >  --   --  133*  --  133* 133* 137 133* 134*  K 3.6  --   --   --    < >  --   --  4.1  --  3.8 5.0 5.5* 4.5 3.9  CL 96*  --   --   --   --   --   --  101  --  99 99 99 98 97*  CO2 23  --   --   --   --   --   --  26  --  24 27 29 27 27   ANIONGAP 13  --   --   --   --   --   --  6  --  10 7 9 8 10   GLUCOSE 129*  --   --   --   --   --   --  146*  --  200* 126* 164* 102* 107*  BUN 13  --   --   --   --   --   --  14  --  19 19 25* 27* 25*  CREATININE 1.44*  --   --   --   --   --   --  1.11  --  1.07 0.98 1.15 1.06 1.09  AST 32  --   --   --   --   --   --  23  --  24  --   --   --   --   ALT 20  --   --   --   --   --   --  18  --  18  --   --   --   --   ALKPHOS 68  --   --   --   --   --   --  55  --  49  --   --   --   --   BILITOT 1.7*  --   --   --   --   --   --  0.4  --  0.5  --   --   --   --   ALBUMIN  3.0*  --   --   --   --   --   --  2.6*  --  2.5*  --   --   --   --   CRP  --   --   --   --   --   --   --   --  12.6* 7.6* 3.7* 2.4* 1.1*  --   DDIMER  --   --   --   --   --   --   --   --  1.67* 1.28*  --   --   --   --   PROCALCITON  --   --   --   --   --  0.14  --  0.11  --   --   --   --   --   --   LATICACIDVEN  --  3.0*  --  3.2*  --  2.8* 1.8  --   --   --   --   --   --   --   INR 1.2  --   --   --   --   --   --   --   --   --   --   --   --   --   BNP  --   --  52.7  --   --   --   --   --   --   --   --   --   --   --   MG  --   --   --   --   --   --   --   --   --   --   --   --   --  2.1  CALCIUM 8.6*  --   --   --   --   --   --  8.4*  --  8.3* 8.4* 8.6* 8.5* 8.6*   < > = values in this interval not displayed.    Total Time in preparing paper  work, data evaluation and todays exam - 35 minutes  Signature  -    Lavada Stank M.D on 02/06/2024 at 9:31 AM   -  To page go to [email or web address]

## 2024-02-06 NOTE — TOC Transition Note (Addendum)
 Transition of Care [facility]) - Discharge Note   Patient Details  Name: Wayne Booth MRN: [id number] Date of Birth: 01-14-1941  Transition of [facility]) CM/SW Contact:  Marval Gell, RN Phone Number: 02/06/2024, 9:43 AM   Clinical Narrative:     Unable to reach patient or wife to update on [facility].  Disclosed to [employer] concerns of patient's behavior brought up by [employer], and [facility] liaison willing to accept for [facility] services.  No other ICM needs identified for DC   11:31 Wife returned call and was unaware that [facility] dropped them from service, and was surprised to hear about allegations of his innapropriate behavior. Reassured wife that patient's care was top priority and I found another [facility] agency, [facility]. Wife appreciative.   Final next level of care: Home w Home Health Services Barriers to Discharge: No Barriers Identified   Patient Goals and CMS Choice            Discharge Placement                       Discharge Plan and Services Additional resources added to the After Visit Summary for     Discharge Planning Services: CM Consult                      HH Arranged: RN, PT [facility] Agency: [employer] Home Health Services Date [facility] Agency Contacted: 02/06/24 Time HH Agency Contacted: [phone] Representative spoke with at [facility] Agency: [place]  Social Drivers of Health (SDOH) Interventions SDOH Screenings   Food Insecurity: No Food Insecurity (02/01/2024)  Housing: Low Risk  (02/01/2024)  Transportation Needs: No Transportation Needs (02/01/2024)  Utilities: Not At Risk (02/01/2024)  Alcohol  Screen: Low Risk  (01/17/2024)  Depression (PHQ2-9): Low Risk  (01/17/2024)  Recent Concern: Depression (PHQ2-9) - High Risk (01/03/2024)  Financial Resource Strain: Low Risk  (01/17/2024)  Physical Activity: Sufficiently Active (01/17/2024)  Social Connections: Moderately Integrated (02/01/2024)  Stress: No Stress Concern Present (01/17/2024)  Tobacco Use: Low Risk  (01/31/2024)  Health  Literacy: Adequate Health Literacy (01/17/2024)     Readmission Risk Interventions    12/22/2023    5:01 PM 10/20/2023   11:54 AM  Readmission Risk Prevention Plan  Transportation Screening Complete Complete  PCP or Specialist Appt within 5-7 Days  Complete  PCP or Specialist Appt within 3-5 Days Complete   Home Care Screening  Complete  Medication Review (RN CM)  Complete  HRI or Home Care Consult Complete   Palliative Care Screening Not Applicable   Medication Review (RN Care Manager) Complete

## 2024-02-06 NOTE — Plan of Care (Signed)
  Problem: Education: Goal: Knowledge of risk factors and measures for prevention of condition will improve Outcome: Completed/Met   Problem: Coping: Goal: Psychosocial and spiritual needs will be supported Outcome: Completed/Met   Problem: Respiratory: Goal: Will maintain a patent airway Outcome: Completed/Met Goal: Complications related to the disease process, condition or treatment will be avoided or minimized Outcome: Completed/Met   Problem: Education: Goal: Knowledge of General Education information will improve Description: Including pain rating scale, medication(s)/side effects and non-pharmacologic comfort measures Outcome: Completed/Met   Problem: Health Behavior/Discharge Planning: Goal: Ability to manage health-related needs will improve Outcome: Completed/Met   Problem: Clinical Measurements: Goal: Ability to maintain clinical measurements within normal limits will improve Outcome: Completed/Met Goal: Will remain free from infection Outcome: Completed/Met Goal: Diagnostic test results will improve Outcome: Completed/Met Goal: Respiratory complications will improve Outcome: Completed/Met Goal: Cardiovascular complication will be avoided Outcome: Completed/Met   Problem: Activity: Goal: Risk for activity intolerance will decrease Outcome: Completed/Met   Problem: Nutrition: Goal: Adequate nutrition will be maintained Outcome: Completed/Met   Problem: Coping: Goal: Level of anxiety will decrease Outcome: Completed/Met   Problem: Elimination: Goal: Will not experience complications related to bowel motility Outcome: Completed/Met Goal: Will not experience complications related to urinary retention Outcome: Completed/Met   Problem: Pain Managment: Goal: General experience of comfort will improve and/or be controlled Outcome: Completed/Met   Problem: Safety: Goal: Ability to remain free from injury will improve Outcome: Completed/Met   Problem: Skin  Integrity: Goal: Risk for impaired skin integrity will decrease Outcome: Completed/Met

## 2024-02-06 NOTE — Discharge Instructions (Signed)
 Wear a face mask at home at all times for the next 7 days, anyone visiting you or close to you should be wearing a mask for the same duration.  Follow with Primary MD Katrinka Garnette KIDD, MD and with your pulmonologist in 7 days   Get CBC, CMP, Magnesium , 2 view Chest X ray -  checked next visit with your primary MD   Activity: As tolerated with Full fall precautions use walker/cane & assistance as needed  Disposition Home    Diet: Heart Healthy  Check your Weight same time everyday, if you gain over 2 pounds, or you develop in leg swelling, experience more shortness of breath or chest pain, call your Primary MD immediately. Follow Cardiac Low Salt Diet and 1.5 lit/day fluid restriction.  Special Instructions: If you have smoked or chewed Tobacco  in the last 2 yrs please stop smoking, stop any regular Alcohol   and or any Recreational drug use.  On your next visit with your primary care physician please Get Medicines reviewed and adjusted.  Please request your Prim.MD to go over all Hospital Tests and Procedure/Radiological results at the follow up, please get all Hospital records sent to your Prim MD by signing hospital release before you go home.  If you experience worsening of your admission symptoms, develop shortness of breath, life threatening emergency, suicidal or homicidal thoughts you must seek medical attention immediately by calling 911 or calling your MD immediately  if symptoms less severe.  You Must read complete instructions/literature along with all the possible adverse reactions/side effects for all the Medicines you take and that have been prescribed to you. Take any new Medicines after you have completely understood and accpet all the possible adverse reactions/side effects.   Do not drive when taking Pain medications.  Do not take more than prescribed Pain, Sleep and Anxiety Medications  Wear Seat belts while driving.

## 2024-02-07 ENCOUNTER — Telehealth: Payer: Self-pay

## 2024-02-07 ENCOUNTER — Other Ambulatory Visit (HOSPITAL_COMMUNITY): Payer: Self-pay

## 2024-02-07 NOTE — Transitions of Care (Post Inpatient/ED Visit) (Signed)
   02/07/2024  Name: KALEV TEMME MRN: [id number] DOB: Jul 02, 1940  Today's TOC FU Call Status: Today's TOC FU Call Status:: Unsuccessful Call (1st Attempt) Unsuccessful Call (1st Attempt) Date: 02/07/24  Attempted to reach the patient regarding the most recent Inpatient/ED visit.  Follow Up Plan: Additional outreach attempts will be made to reach the patient to complete the Transitions of Care (Post Inpatient/ED visit) call.   Alan Ee, RN, BSN, CEN [employer]- Transition of Care Team.  [facility] [phone]

## 2024-02-07 NOTE — Transitions of Care (Post Inpatient/ED Visit) (Signed)
   02/07/2024  Name: Wayne Booth MRN: [id number] DOB: Jul 13, 1940  Today's TOC FU Call Status: Today's TOC FU Call Status:: Unsuccessful Call (1st Attempt) Unsuccessful Call (1st Attempt) Date: 02/07/24  Attempted to reach the patient regarding the most recent Inpatient/ED visit.  Follow Up Plan: Additional outreach attempts will be made to reach the patient to complete the Transitions of Care (Post Inpatient/ED visit) call.   Alan Ee, RN, BSN, CEN [employer]- Transition of Care Team.  [facility] [phone]

## 2024-02-08 ENCOUNTER — Telehealth: Payer: Self-pay | Admitting: *Deleted

## 2024-02-08 NOTE — Transitions of Care (Post Inpatient/ED Visit) (Signed)
   02/08/2024  Name: Wayne Booth MRN: [id number] DOB: 10-14-1940  Today's TOC FU Call Status: Today's TOC FU Call Status:: Unsuccessful Call (2nd Attempt) Unsuccessful Call (2nd Attempt) Date: 02/08/24  Attempted to reach the patient regarding the most recent Inpatient/ED visit.  Follow Up Plan: Additional outreach attempts will be made to reach the patient to complete the Transitions of Care (Post Inpatient/ED visit) call.   Mliss Creed [facility], BSN RN Care Manager/ Transition of Care [facility] [phone]

## 2024-02-10 ENCOUNTER — Telehealth: Payer: Self-pay

## 2024-02-10 DIAGNOSIS — R531 Weakness: Secondary | ICD-10-CM | POA: Diagnosis not present

## 2024-02-10 DIAGNOSIS — J449 Chronic obstructive pulmonary disease, unspecified: Secondary | ICD-10-CM | POA: Diagnosis not present

## 2024-02-10 DIAGNOSIS — Z9981 Dependence on supplemental oxygen: Secondary | ICD-10-CM | POA: Diagnosis not present

## 2024-02-10 DIAGNOSIS — I5032 Chronic diastolic (congestive) heart failure: Secondary | ICD-10-CM | POA: Diagnosis not present

## 2024-02-10 NOTE — Transitions of Care (Post Inpatient/ED Visit) (Signed)
   02/10/2024  Name: Wayne Booth MRN: [id number] DOB: October 22, 1940  Today's TOC FU Call Status: Today's TOC FU Call Status:: Unsuccessful Call (3rd Attempt) Unsuccessful Call (3rd Attempt) Date: 02/10/24  Attempted to reach the patient regarding the most recent Inpatient/ED visit.  Follow Up Plan: No further outreach attempts will be made at this time. We have been unable to contact the patient.  Alan Ee, RN, BSN, CEN [employer]- Transition of Care Team.  [facility] [phone]

## 2024-02-14 ENCOUNTER — Inpatient Hospital Stay: Admitting: Family Medicine

## 2024-02-15 ENCOUNTER — Inpatient Hospital Stay: Admitting: Family Medicine

## 2024-02-15 ENCOUNTER — Encounter (HOSPITAL_COMMUNITY)

## 2024-02-25 ENCOUNTER — Ambulatory Visit: Payer: Self-pay

## 2024-02-25 NOTE — Telephone Encounter (Signed)
  FYI Only or Action Required?: FYI only for provider.  Patient was last seen in primary care on 02/11/2023 by Katrinka Garnette KIDD, MD.  Called Nurse Triage reporting COPD and Shortness of Breath.  Symptoms began several weeks ago.  Interventions attempted: Other: regular COPD management.  Symptoms are: gradually worsening.  Triage Disposition: See PCP When Office is Open (Within 3 Days)  Patient/caregiver understands and will follow disposition?: Yes Copied from CRM [id number]. Topic: Clinical - Red Word Triage >> Feb 25, 2024 12:24 PM Jayma L wrote: Red Word that prompted transfer to Nurse Triage: patient wife Elijah called in and stated patient has COPD and is struggling to breath and has SOB, asking he be seen right away . Reason for Disposition  [1] MODERATE longstanding difficulty breathing (e.g., speaks in phrases, SOB even at rest, pulse 100-120) AND [2] SAME as normal  Answer Assessment - Initial Assessment Questions 1. RESPIRATORY STATUS: Describe your breathing? (e.g., wheezing, shortness of breath, unable to speak, severe coughing)      Underlying shortness of breath due to COPD Worse at night 2. ONSET: When did this breathing problem begin?      Worsening since last case of COVID at the beginning of [place] 3. PATTERN Does the difficult breathing come and go, or has it been constant since it started?      Comes and goes, worse at night 4. SEVERITY: How bad is your breathing? (e.g., mild, moderate, severe)      moderate 5. RECURRENT SYMPTOM: Have you had difficulty breathing before? If Yes, ask: When was the last time? and What happened that time?      Yes, underlying COPD 6. CARDIAC HISTORY: Do you have any history of heart disease? (e.g., heart attack, angina, bypass surgery, angioplasty)      Heart failure 7. LUNG HISTORY: Do you have any history of lung disease?  (e.g., pulmonary embolus, asthma, emphysema)     COPD 8. CAUSE: What do you think is  causing the breathing problem?      Recovering from recent covid 9. OTHER SYMPTOMS: Do you have any other symptoms? (e.g., chest pain, cough, dizziness, fever, runny nose)     Chronic cough 10. O2 SATURATION MONITOR:  Do you use an oxygen  saturation monitor (pulse oximeter) at home? If Yes, ask: What is your reading (oxygen  level) today? What is your usual oxygen  saturation reading? (e.g., 95%)       5L per [place] 11. PREGNANCY: Is there any chance you are pregnant? When was your last menstrual period?       N/A 12. TRAVEL: Have you traveled out of the country in the last month? (e.g., travel history, exposures)       N/A  Protocols used: Breathing Difficulty-A-AH

## 2024-02-29 ENCOUNTER — Ambulatory Visit: Admitting: Family Medicine

## 2024-02-29 NOTE — Telephone Encounter (Signed)
 Noted. Patient seeing Dr. Katrinka 02/29/2024.

## 2024-03-31 DIAGNOSIS — M545 Low back pain, unspecified: Secondary | ICD-10-CM | POA: Diagnosis not present

## 2024-03-31 DIAGNOSIS — M25531 Pain in right wrist: Secondary | ICD-10-CM | POA: Diagnosis not present

## 2024-03-31 DIAGNOSIS — M1712 Unilateral primary osteoarthritis, left knee: Secondary | ICD-10-CM | POA: Diagnosis not present

## 2024-03-31 DIAGNOSIS — M1711 Unilateral primary osteoarthritis, right knee: Secondary | ICD-10-CM | POA: Diagnosis not present

## 2024-03-31 DIAGNOSIS — M25532 Pain in left wrist: Secondary | ICD-10-CM | POA: Diagnosis not present

## 2024-04-03 ENCOUNTER — Other Ambulatory Visit: Payer: Self-pay | Admitting: Family Medicine

## 2024-04-07 ENCOUNTER — Encounter: Payer: Self-pay | Admitting: Family Medicine

## 2024-04-07 ENCOUNTER — Telehealth: Admitting: Family Medicine

## 2024-04-07 VITALS — Wt 228.0 lb

## 2024-04-07 DIAGNOSIS — J449 Chronic obstructive pulmonary disease, unspecified: Secondary | ICD-10-CM | POA: Diagnosis not present

## 2024-04-07 DIAGNOSIS — F411 Generalized anxiety disorder: Secondary | ICD-10-CM

## 2024-04-07 DIAGNOSIS — J9611 Chronic respiratory failure with hypoxia: Secondary | ICD-10-CM

## 2024-04-07 DIAGNOSIS — F3341 Major depressive disorder, recurrent, in partial remission: Secondary | ICD-10-CM

## 2024-04-07 DIAGNOSIS — Z8616 Personal history of COVID-19: Secondary | ICD-10-CM

## 2024-04-07 MED ORDER — SERTRALINE HCL 100 MG PO TABS: 150.0000 mg | ORAL_TABLET | Freq: Every day | ORAL | 3 refills | Status: AC

## 2024-04-07 NOTE — Progress Notes (Signed)
 Phone [phone] Virtual visit via Video note   Subjective:  Chief complaint: Chief Complaint  Patient presents with   Follow-up    Hospital follow up;    Our team/I attempted to connect with Wayne Booth at  1:20 PM EDT by a video enabled telemedicine application (caregility through epic) and verified that I am speaking with the correct person using two identifiers.  Unfortunately despite repeated efforts by our team they were unable to connect through MyChart and we resorted to a phone visit.  Our team/I discussed the limitations of evaluation and management by telemedicine and the availability of in person appointments.No physical exam was performed (except for noted visual exam or audio findings with Telehealth visits).   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  Dr. Renda 10/02/2008    Priority: High   History of renal cell carcinoma 12/26/2007    Priority: High   PTSD (post-traumatic stress disorder) 07/27/2018    Priority: Medium    History of adenomatous polyp of colon 04/12/2017    Priority: Medium    DDD (degenerative disc disease), cervical 11/13/2015    Priority: Medium    Essential tremor 08/14/2015     Priority: Medium    Gout 08/14/2015    Priority: Medium    Hypothyroidism 07/12/2014    Priority: Medium    Hyperglycemia 07/12/2014    Priority: Medium    Generalized anxiety disorder 11/07/2013    Priority: Medium    Obstructive sleep apnea 07/07/2011    Priority: Medium    Insomnia 12/26/2007    Priority: Medium    Essential hypertension 12/28/2006    Priority: Medium    Aortic atherosclerosis 05/10/2018    Priority: Low   First degree AV block 04/15/2018    Priority: Low   Constipation 01/21/2018    Priority: Low   Pain in left knee 07/15/2017    Priority: Low   Bilateral foot pain 07/13/2017    Priority: Low   Status post nephrectomy 06/09/2017    Priority: Low   VTE (venous thromboembolism) 06/09/2017    Priority: Low   Osteoarthritis 11/13/2008    Priority: Low   TESTOSTERONE  DEFICIENCY 12/28/2006    Priority: Low   Pneumonia due to COVID-19 virus 01/31/2024   Acute renal failure superimposed on stage 2 chronic kidney disease 01/31/2024   Morbid obesity (HCC) 12/31/2023   History of CAD (coronary artery disease) 12/31/2023   Paroxysmal atrial fibrillation (HCC) 12/20/2023   History of COPD 12/20/2023   COPD (chronic obstructive pulmonary disease) (HCC) 08/29/2023   Palliative care patient 10/26/2022   Conjunctivitis 10/12/2022   Physical deconditioning 07/17/2022   Pharmacologic therapy 01/26/2021   Disorder  of skeletal system 01/26/2021   Problems influencing health status 01/26/2021   Nodule of right lung 10/13/2019   Hyponatremia 04/15/2018   History of pulmonary embolus (PE) 04/15/2018   Peripheral neuropathy 04/14/2018   Chronic pain syndrome 04/09/2018   Dyspnea on exertion    History of fusion of cervical spine 04/24/2015   History of total knee replacement, right 12/17/2008   History of total knee replacement, left 10/28/2006    Medications- reviewed and updated Current Outpatient Medications  Medication Sig Dispense Refill   acetaminophen   (TYLENOL ) 325 MG tablet Take 2 tablets (650 mg total) by mouth every 6 (six) hours as needed for mild pain (pain score 1-3) or fever (or Fever >/= 101).     albuterol  (PROVENTIL ) (2.5 MG/3ML) 0.083% nebulizer solution Take 3 mLs (2.5 mg total) by nebulization every 6 (six) hours as needed for wheezing or shortness of breath. 75 mL 3   albuterol  (VENTOLIN  HFA) 108 (90 Base) MCG/ACT inhaler INHALE 2 PUFFS BY MOUTH EVERY 6 HOURS AS NEEDED FOR WHEEZING OR SHORTNESS OF BREATH 8.5 g 2   budesonide  (PULMICORT ) 0.25 MG/2ML nebulizer solution Take 2 mLs (0.25 mg total) by nebulization 2 (two) times daily. 120 mL 1   furosemide  (LASIX ) 40 MG tablet Take 1 tablet (40 mg total) by mouth daily. 30 tablet 0   gabapentin  (NEURONTIN ) 300 MG capsule TAKE 1 CAPSULE BY MOUTH 3 TIMES A DAY 90 capsule 5   ipratropium-albuterol  (DUONEB) 0.5-2.5 (3) MG/3ML SOLN Take 3 mLs by nebulization every 6 (six) hours as needed. 360 mL 1   ondansetron  (ZOFRAN ) 4 MG tablet Take 1 tablet (4 mg total) by mouth every 6 (six) hours as needed for nausea. 20 tablet 0   OXYGEN  Inhale 5 L into the lungs continuous.     sertraline  (ZOLOFT ) 100 MG tablet Take 1.5 tablets (150 mg total) by mouth daily. 135 tablet 3   spironolactone  (ALDACTONE ) 25 MG tablet Take 1 tablet (25 mg total) by mouth daily. 30 tablet 0   traMADol  (ULTRAM ) 50 MG tablet Take 50 mg by mouth every 6 (six) hours as needed for moderate pain (pain score 4-6).     traZODone  (DESYREL ) 50 MG tablet TAKE 1 TABLET BY MOUTH AT BEDTIME 5 tablet 0   No current facility-administered medications for this visit.     Objective:  Wt 228 lb (103.4 kg)   BMI 31.80 kg/m  self reported vitals. Agreed to get blood pressure covid      Assessment and Plan    # Hospital follow-up for COVID-19 pneumonia S: Patient was admitted January 31, 2024 and had bilateral patchy airspace opacities.  COVID test was positive.  Flu and RSV PCR was negative.  Wayne Booth had acute on chronic hypoxic respiratory  failure.  Wayne Booth was treated with remdesivir  for 3 days.  After these 3 days Wayne Booth was started on prednisone .  PT and OT worked with him during hospitalization.  Wayne Booth was encouraged to use incentive spirometry.  Before discharge Wayne Booth was actually down to 4 L of oxygen  better than his 5 L at home-they sent him home with home PT and RN as well as oral steroid taper and encouraged PCP and pulmonary follow-up.  Patient has had extreme difficulty getting into the office and was unable to come in today as a result.  They also gave him bronchodilators for baseline COPD including budesonide .  Also was maintained on diuretics and recommended fluid restriction with heart failure with preserved ejection fraction-she was also  continued on Jardiance  and spironolactone  25 mg along with Lasix  40 mg on discharge.  Wayne Booth did have acute kidney injury on top of chronic kidney disease stage II but improved before discharge.  Wayne Booth maintained sinus rhythm in regards to his atrial fibrillation-Wayne Booth remains off anticoagulation per his preference other than Lovenox  during hospitalization to reduce risk of recurrent pulmonary embolism which Wayne Booth has had in the past.  His other chronic conditions were stable during hospitalization  Hospital course also included bilateral lower extremity Dopplers which showed no DVT.  Wayne Booth had blood cultures which showed no growth. A/P: COVID-pneumonia-has fully resolved-Wayne Booth is doing much better COPD-improved control.  Not using pulmicort  or albuterol  lately- breathing ok- only uses if really exerts himself.  Does have chronic respiratory failure on down to 4 L at home now it sounds like -His other chronic conditions as noted above appear stable   # Depression # Generalized anxiety disorder  S: Medication:Lexapro  20Mg , Buspirone  5Mg   at bedtime but not helping Also on trazodone  for sleep and finds this helpful -lost son this year- very hard -Also have encouraged therapist-most recently 12/26/2020    04/07/2024    2:27  PM 01/17/2024    2:21 PM 01/03/2024    2:21 PM  Depression screen PHQ 2/9  Decreased Interest 2 0 3  Down, Depressed, Hopeless 2 0 3  PHQ - 2 Score 4 0 6  Altered sleeping 0 0 3  Tired, decreased energy 3 0 3  Change in appetite 0 0 3  Feeling bad or failure about yourself  0 0 0  Trouble concentrating 0 0 3  Moving slowly or fidgety/restless 0 0 0  Suicidal thoughts 0 0 0  PHQ-9 Score 7 0 18  Difficult doing work/chores Somewhat difficult Not difficult at all Somewhat difficult      04/07/2024    2:29 PM 12/16/2017    4:08 PM  GAD 7 : Generalized Anxiety Score  Nervous, Anxious, on Edge 3 1  Control/stop worrying 2 0  Worry too much - different things 2 0  Trouble relaxing 0 0  Restless 0 0  Easily annoyed or irritable 3 1  Afraid - awful might happen 0 1  Total GAD 7 Score 10 3  Anxiety Difficulty Somewhat difficult Not difficult at all  A/P:  83 year old male with poorly controlled anxiety already on lexapro  20 mg and buspirone  5 mg before bed. Wayne Booth is also on gabapentin  3x a day at 300mg . unfortunately lost his son recently and that has been very difficulty- encouraging grief counseling. buspirone  not helpful. high risk for falls so worry about benzodiazepines as well as hydroxyzine .  -After prolonged discussion we decided to switch to sertraline  150 mg starting tomorrow -We are going to drop buspirone  since not helpful to reduce polypharmacy -Follow-up in 6 weeks-Wayne Booth feels like Wayne Booth may be able to muster up the strength to get in here that time as well as coming back for a flu shot  Recommended follow up: 6 weeks in person ideally Future Appointments  Date Time Provider Department Center  04/12/2024  3:15 PM [facility] CLINICAL SUPPORT [facility] Wayne Booth  01/17/2025  1:40 PM [facility] ANNUAL WELLNESS VISIT 1 [facility] Wayne Booth    Lab/Order associations:   ICD-10-CM   1. History of COVID-19  Z86.16     2. Chronic respiratory failure with hypoxia (HCC)  J96.11     3.  Chronic obstructive pulmonary disease, unspecified COPD type (HCC)  J44.9  4. GAD (generalized anxiety disorder)  F41.1     5. Recurrent major depressive disorder, in partial remission  F33.41       Meds ordered this encounter  Medications   sertraline  (ZOLOFT ) 100 MG tablet    Sig: Take 1.5 tablets (150 mg total) by mouth daily.    Dispense:  135 tablet    Refill:  3    Return precautions advised.  Garnette Lukes, MD

## 2024-04-07 NOTE — Patient Instructions (Addendum)
 Start sertraline  150 mg tomorrow morning -stop Lexapro  -stop buspirone   Recommended follow up: Return in about 6 weeks (around 05/19/2024) for followup or sooner if needed.Schedule b4 you leave.

## 2024-04-12 ENCOUNTER — Ambulatory Visit

## 2024-04-25 ENCOUNTER — Other Ambulatory Visit: Payer: Self-pay | Admitting: Family Medicine

## 2024-04-25 MED ORDER — TRAZODONE HCL 50 MG PO TABS: 50.0000 mg | ORAL_TABLET | Freq: Every day | ORAL | 0 refills | Status: DC

## 2024-04-25 NOTE — Telephone Encounter (Signed)
 Copied from CRM [id number]. Topic: Clinical - Medication Refill >> Apr 25, 2024 12:01 PM Mercedes MATSU wrote: Medication:  traZODone  (DESYREL ) 50 MG tablet     Has the patient contacted their pharmacy? Yes (Agent: If no, request that the patient contact the pharmacy for the refill. If patient does not wish to contact the pharmacy document the reason why and proceed with request.) (Agent: If yes, when and what did the pharmacy advise?)  This is the patient's preferred pharmacy:  [facility] [id number] - [place], [place] - [address] [place] [address] Phone: [phone] Fax: [phone]  [facility] [address] [place] [address] Phone: [phone] Fax: [phone]  Is this the correct pharmacy for this prescription? Yes If no, delete pharmacy and type the correct one.   Has the prescription been filled recently? Yes  Is the patient out of the medication? Yes  Has the patient been seen for an appointment in the last year OR does the patient have an upcoming appointment? Yes  Can we respond through MyChart? Yes  Agent: Please be advised that Rx refills may take up to 3 business days. We ask that you follow-up with your pharmacy.

## 2024-05-02 ENCOUNTER — Other Ambulatory Visit: Payer: Self-pay | Admitting: Family Medicine

## 2024-05-02 NOTE — Telephone Encounter (Signed)
This was refilled 1 week ago.

## 2024-05-11 ENCOUNTER — Inpatient Hospital Stay (HOSPITAL_COMMUNITY)
Admission: EM | Admit: 2024-05-11 | Discharge: 2024-05-18 | DRG: 291 | Disposition: A | Discharge status: Home Health | Attending: Internal Medicine | Admitting: Internal Medicine

## 2024-05-11 ENCOUNTER — Other Ambulatory Visit: Payer: Self-pay

## 2024-05-11 ENCOUNTER — Encounter (HOSPITAL_COMMUNITY): Payer: Self-pay | Admitting: Student

## 2024-05-11 ENCOUNTER — Emergency Department (HOSPITAL_COMMUNITY)

## 2024-05-11 DIAGNOSIS — Z7951 Long term (current) use of inhaled steroids: Secondary | ICD-10-CM

## 2024-05-11 DIAGNOSIS — K567 Ileus, unspecified: Secondary | ICD-10-CM | POA: Diagnosis not present

## 2024-05-11 DIAGNOSIS — E1122 Type 2 diabetes mellitus with diabetic chronic kidney disease: Secondary | ICD-10-CM | POA: Diagnosis present

## 2024-05-11 DIAGNOSIS — S2243XA Multiple fractures of ribs, bilateral, initial encounter for closed fracture: Secondary | ICD-10-CM | POA: Diagnosis not present

## 2024-05-11 DIAGNOSIS — I130 Hypertensive heart and chronic kidney disease with heart failure and stage 1 through stage 4 chronic kidney disease, or unspecified chronic kidney disease: Secondary | ICD-10-CM | POA: Diagnosis not present

## 2024-05-11 DIAGNOSIS — Z85528 Personal history of other malignant neoplasm of kidney: Secondary | ICD-10-CM

## 2024-05-11 DIAGNOSIS — Z66 Do not resuscitate: Secondary | ICD-10-CM | POA: Diagnosis present

## 2024-05-11 DIAGNOSIS — R0902 Hypoxemia: Principal | ICD-10-CM

## 2024-05-11 DIAGNOSIS — Z9981 Dependence on supplemental oxygen: Secondary | ICD-10-CM

## 2024-05-11 DIAGNOSIS — U099 Post covid-19 condition, unspecified: Secondary | ICD-10-CM | POA: Diagnosis present

## 2024-05-11 DIAGNOSIS — J9611 Chronic respiratory failure with hypoxia: Secondary | ICD-10-CM | POA: Diagnosis present

## 2024-05-11 DIAGNOSIS — I10 Essential (primary) hypertension: Secondary | ICD-10-CM

## 2024-05-11 DIAGNOSIS — J8410 Pulmonary fibrosis, unspecified: Secondary | ICD-10-CM | POA: Diagnosis present

## 2024-05-11 DIAGNOSIS — R531 Weakness: Secondary | ICD-10-CM | POA: Diagnosis present

## 2024-05-11 DIAGNOSIS — I5033 Acute on chronic diastolic (congestive) heart failure: Secondary | ICD-10-CM | POA: Diagnosis present

## 2024-05-11 DIAGNOSIS — Z634 Disappearance and death of family member: Secondary | ICD-10-CM

## 2024-05-11 DIAGNOSIS — F419 Anxiety disorder, unspecified: Secondary | ICD-10-CM | POA: Diagnosis present

## 2024-05-11 DIAGNOSIS — R0602 Shortness of breath: Secondary | ICD-10-CM | POA: Diagnosis present

## 2024-05-11 DIAGNOSIS — E039 Hypothyroidism, unspecified: Secondary | ICD-10-CM | POA: Diagnosis present

## 2024-05-11 DIAGNOSIS — G4733 Obstructive sleep apnea (adult) (pediatric): Secondary | ICD-10-CM | POA: Diagnosis present

## 2024-05-11 DIAGNOSIS — Z888 Allergy status to other drugs, medicaments and biological substances status: Secondary | ICD-10-CM

## 2024-05-11 DIAGNOSIS — N179 Acute kidney failure, unspecified: Secondary | ICD-10-CM | POA: Diagnosis present

## 2024-05-11 DIAGNOSIS — I509 Heart failure, unspecified: Secondary | ICD-10-CM

## 2024-05-11 DIAGNOSIS — Z8709 Personal history of other diseases of the respiratory system: Secondary | ICD-10-CM

## 2024-05-11 DIAGNOSIS — E669 Obesity, unspecified: Secondary | ICD-10-CM | POA: Diagnosis present

## 2024-05-11 DIAGNOSIS — J449 Chronic obstructive pulmonary disease, unspecified: Secondary | ICD-10-CM | POA: Diagnosis present

## 2024-05-11 DIAGNOSIS — Z86711 Personal history of pulmonary embolism: Secondary | ICD-10-CM

## 2024-05-11 DIAGNOSIS — W19XXXA Unspecified fall, initial encounter: Secondary | ICD-10-CM | POA: Diagnosis not present

## 2024-05-11 DIAGNOSIS — G8929 Other chronic pain: Secondary | ICD-10-CM | POA: Diagnosis present

## 2024-05-11 DIAGNOSIS — Z8546 Personal history of malignant neoplasm of prostate: Secondary | ICD-10-CM

## 2024-05-11 DIAGNOSIS — Z96653 Presence of artificial knee joint, bilateral: Secondary | ICD-10-CM | POA: Diagnosis present

## 2024-05-11 DIAGNOSIS — I2510 Atherosclerotic heart disease of native coronary artery without angina pectoris: Secondary | ICD-10-CM | POA: Diagnosis present

## 2024-05-11 DIAGNOSIS — Z9049 Acquired absence of other specified parts of digestive tract: Secondary | ICD-10-CM

## 2024-05-11 DIAGNOSIS — F4310 Post-traumatic stress disorder, unspecified: Secondary | ICD-10-CM | POA: Diagnosis present

## 2024-05-11 DIAGNOSIS — Z79899 Other long term (current) drug therapy: Secondary | ICD-10-CM

## 2024-05-11 DIAGNOSIS — Z905 Acquired absence of kidney: Secondary | ICD-10-CM

## 2024-05-11 DIAGNOSIS — Z981 Arthrodesis status: Secondary | ICD-10-CM

## 2024-05-11 DIAGNOSIS — I480 Paroxysmal atrial fibrillation: Secondary | ICD-10-CM | POA: Diagnosis present

## 2024-05-11 DIAGNOSIS — N182 Chronic kidney disease, stage 2 (mild): Secondary | ICD-10-CM | POA: Diagnosis present

## 2024-05-11 DIAGNOSIS — Z6830 Body mass index (BMI) 30.0-30.9, adult: Secondary | ICD-10-CM

## 2024-05-11 HISTORY — DX: Type 2 diabetes mellitus without complications: E11.9

## 2024-05-11 LAB — COMPREHENSIVE METABOLIC PANEL WITH GFR
ALT: 36 U/L (ref 0–44)
AST: 41 U/L (ref 15–41)
Albumin: 3.2 g/dL — ABNORMAL LOW (ref 3.5–5.0)
Alkaline Phosphatase: 68 U/L (ref 38–126)
Anion gap: 10 (ref 5–15)
BUN: 17 mg/dL (ref 8–23)
CO2: 27 mmol/L (ref 22–32)
Calcium: 8.9 mg/dL (ref 8.9–10.3)
Chloride: 102 mmol/L (ref 98–111)
Creatinine, Ser: 1.06 mg/dL (ref 0.61–1.24)
GFR, Estimated: >60 mL/min (ref >60)
Glucose, Bld: 62 mg/dL — ABNORMAL LOW (ref 70–99)
Potassium: 4.6 mmol/L (ref 3.5–5.1)
Sodium: 139 mmol/L (ref 135–145)
Total Bilirubin: 0.4 mg/dL (ref 0.0–1.2)
Total Protein: 8.3 g/dL — ABNORMAL HIGH (ref 6.5–8.1)

## 2024-05-11 LAB — CBC WITH DIFFERENTIAL/PLATELET
Abs Immature Granulocytes: 0.04 K/uL (ref 0.00–0.07)
Basophils Absolute: 0 K/uL (ref 0.0–0.1)
Basophils Relative: 0 %
Eosinophils Absolute: 0.2 K/uL (ref 0.0–0.5)
Eosinophils Relative: 3 %
HCT: 49.4 % (ref 39.0–52.0)
Hemoglobin: 15.8 g/dL (ref 13.0–17.0)
Immature Granulocytes: 1 %
Lymphocytes Relative: 17 %
Lymphs Abs: 1.4 K/uL (ref 0.7–4.0)
MCH: 29.9 pg (ref 26.0–34.0)
MCHC: 32 g/dL (ref 30.0–36.0)
MCV: 93.4 fL (ref 80.0–100.0)
Monocytes Absolute: 1.1 K/uL — ABNORMAL HIGH (ref 0.1–1.0)
Monocytes Relative: 13 %
Neutro Abs: 5.5 K/uL (ref 1.7–7.7)
Neutrophils Relative %: 66 %
Platelets: 306 K/uL (ref 150–400)
RBC: 5.29 MIL/uL (ref 4.22–5.81)
RDW: 13.8 % (ref 11.5–15.5)
WBC: 8.3 K/uL (ref 4.0–10.5)
nRBC: 0 % (ref 0.0–0.2)

## 2024-05-11 LAB — I-STAT VENOUS BLOOD GAS, ED
Acid-Base Excess: 3 mmol/L — ABNORMAL HIGH (ref 0.0–2.0)
Bicarbonate: 29.4 mmol/L — ABNORMAL HIGH (ref 20.0–28.0)
Calcium, Ion: 1.08 mmol/L — ABNORMAL LOW (ref 1.15–1.40)
HCT: 48 % (ref 39.0–52.0)
Hemoglobin: 16.3 g/dL (ref 13.0–17.0)
O2 Saturation: 47 %
Potassium: 4.5 mmol/L (ref 3.5–5.1)
Sodium: 140 mmol/L (ref 135–145)
TCO2: 31 mmol/L (ref 22–32)
pCO2, Ven: 48.3 mmHg (ref 44–60)
pH, Ven: 7.392 (ref 7.25–7.43)
pO2, Ven: 26 mmHg — CL (ref 32–45)

## 2024-05-11 LAB — I-STAT CHEM 8, ED
BUN: 22 mg/dL (ref 8–23)
Calcium, Ion: 1.06 mmol/L — ABNORMAL LOW (ref 1.15–1.40)
Chloride: 101 mmol/L (ref 98–111)
Creatinine, Ser: 1.1 mg/dL (ref 0.61–1.24)
Glucose, Bld: 60 mg/dL — ABNORMAL LOW (ref 70–99)
HCT: 49 % (ref 39.0–52.0)
Hemoglobin: 16.7 g/dL (ref 13.0–17.0)
Potassium: 4.5 mmol/L (ref 3.5–5.1)
Sodium: 140 mmol/L (ref 135–145)
TCO2: 29 mmol/L (ref 22–32)

## 2024-05-11 LAB — TROPONIN I (HIGH SENSITIVITY): Troponin I (High Sensitivity): 5 ng/L (ref <18)

## 2024-05-11 LAB — BRAIN NATRIURETIC PEPTIDE: B Natriuretic Peptide: 50.9 pg/mL (ref 0.0–100.0)

## 2024-05-11 MED ORDER — SENNOSIDES-DOCUSATE SODIUM 8.6-50 MG PO TABS: 1.0000 | ORAL_TABLET | Freq: Every evening | ORAL | Status: DC | PRN

## 2024-05-11 MED ORDER — FUROSEMIDE 10 MG/ML IJ SOLN
20.0000 mg | Freq: Once | INTRAMUSCULAR | Status: AC
  Administered 2024-05-11: 20 mg via INTRAVENOUS
  Filled 2024-05-11: qty 2

## 2024-05-11 MED ORDER — SPIRONOLACTONE 25 MG PO TABS: 25.0000 mg | ORAL_TABLET | Freq: Every day | ORAL | Status: DC

## 2024-05-11 MED ORDER — BUDESONIDE 0.25 MG/2ML IN SUSP
0.2500 mg | Freq: Two times a day (BID) | RESPIRATORY_TRACT | Status: DC
  Administered 2024-05-11 – 2024-05-18 (×13): 0.25 mg via RESPIRATORY_TRACT
  Filled 2024-05-11 (×14): qty 2

## 2024-05-11 MED ORDER — TRAZODONE HCL 50 MG PO TABS
50.0000 mg | ORAL_TABLET | Freq: Every day | ORAL | Status: DC
  Administered 2024-05-11 – 2024-05-17 (×7): 50 mg via ORAL
  Filled 2024-05-11 (×7): qty 1

## 2024-05-11 MED ORDER — ONDANSETRON HCL 4 MG/2ML IJ SOLN
4.0000 mg | Freq: Four times a day (QID) | INTRAMUSCULAR | Status: DC | PRN
  Administered 2024-05-12 – 2024-05-16 (×4): 4 mg via INTRAVENOUS
  Filled 2024-05-11 (×4): qty 2

## 2024-05-11 MED ORDER — SPIRONOLACTONE 25 MG PO TABS
25.0000 mg | ORAL_TABLET | Freq: Every day | ORAL | Status: DC
  Administered 2024-05-12 – 2024-05-14 (×3): 25 mg via ORAL
  Filled 2024-05-11 (×3): qty 1

## 2024-05-11 MED ORDER — FUROSEMIDE 10 MG/ML IJ SOLN
40.0000 mg | Freq: Every day | INTRAMUSCULAR | Status: DC
  Administered 2024-05-12: 40 mg via INTRAVENOUS
  Filled 2024-05-11: qty 4

## 2024-05-11 MED ORDER — IPRATROPIUM-ALBUTEROL 0.5-2.5 (3) MG/3ML IN SOLN
3.0000 mL | Freq: Four times a day (QID) | RESPIRATORY_TRACT | Status: DC | PRN
  Administered 2024-05-15: 3 mL via RESPIRATORY_TRACT
  Filled 2024-05-11: qty 3

## 2024-05-11 MED ORDER — IPRATROPIUM-ALBUTEROL 0.5-2.5 (3) MG/3ML IN SOLN
3.0000 mL | Freq: Once | RESPIRATORY_TRACT | Status: AC
  Administered 2024-05-11: 3 mL via RESPIRATORY_TRACT
  Filled 2024-05-11: qty 3

## 2024-05-11 MED ORDER — ONDANSETRON HCL 4 MG PO TABS
4.0000 mg | ORAL_TABLET | Freq: Four times a day (QID) | ORAL | Status: DC | PRN
  Administered 2024-05-15 (×2): 4 mg via ORAL
  Filled 2024-05-11 (×2): qty 1

## 2024-05-11 MED ORDER — ACETAMINOPHEN 325 MG PO TABS
650.0000 mg | ORAL_TABLET | Freq: Four times a day (QID) | ORAL | Status: DC | PRN
  Administered 2024-05-13 (×2): 650 mg via ORAL
  Filled 2024-05-11 (×4): qty 2

## 2024-05-11 MED ORDER — ENOXAPARIN SODIUM 40 MG/0.4ML IJ SOSY
40.0000 mg | PREFILLED_SYRINGE | INTRAMUSCULAR | Status: DC
  Administered 2024-05-11 – 2024-05-17 (×7): 40 mg via SUBCUTANEOUS
  Filled 2024-05-11 (×7): qty 0.4

## 2024-05-11 MED ORDER — SERTRALINE HCL 50 MG PO TABS
150.0000 mg | ORAL_TABLET | Freq: Every day | ORAL | Status: DC
  Administered 2024-05-12 – 2024-05-18 (×7): 150 mg via ORAL
  Filled 2024-05-11 (×7): qty 1

## 2024-05-11 MED ORDER — BISACODYL 5 MG PO TBEC
5.0000 mg | DELAYED_RELEASE_TABLET | Freq: Every day | ORAL | Status: DC | PRN
  Administered 2024-05-14: 5 mg via ORAL
  Filled 2024-05-11: qty 1

## 2024-05-11 MED ORDER — ACETAMINOPHEN 650 MG RE SUPP: 650.0000 mg | Freq: Four times a day (QID) | RECTAL | Status: DC | PRN

## 2024-05-11 NOTE — ED Provider Notes (Signed)
 [place] EMERGENCY DEPARTMENT AT [facility] Provider Note  MDM   HPI/ROS:  Wayne Booth is a 83 y.o. male with a PMH HTN, chronic HFpEF, ILD on 5 L O2 at home, essential tremor, renal cell carcinoma s/p left nephrectomy presents with shortness of breath.  Patient states that his wife accidentally forgot to pay the power bill, power went off and he has been without his oxygen  given that it is connected to a compressor.  He states he has been off for about 4 hours, wife relates that she has not paid the bill and is waiting for the power to come back on.  He denies any fevers, chills, N/V/D, rhinorrhea or sputum production.  Does endorse a cough that started when his O2 was turned off.  DDx includes but is not limited to COPD exacerbation, ILD exacerbation, hypoxia secondary to no O2, infection, electrolyte derangement  On my initial evaluation, patient is:  -Vital signs stable. Patient afebrile, hemodynamically stable, and non-toxic appearing.  Physical exam is notable for: - Increased WOB, diminished breath sounds in the bilateral lower lobes, air movement is poor but scattered crackles  VBG 7.392/48.3/29.4, CBC without leukocytosis or anemia, CMP with creatinine 1.06. Patient given DuoNeb and placed on his home 5 L with some improvement in lung sounds.  Patient is tachycardic after the DuoNeb likely secondary from medication.  And tachypnea improved from 36-22, saturating 98% on his home 5L.  CXR with evidence of Stable patchy airspace opacities in the right middle lung and bilateral lung bases.  There is concern for multifocal pneumonia, given the patient has had no sick symptoms, no fever and this is likely chronic and fluid based.  Patient received 20 mg of Lasix .  Patient still does not have power within his home, has no oxygen  access, and his current presentation including history and physical exam is most distended with superimposed CHF exacerbation.  Disposition:  I  discussed the case with hospitalist, Dr. Lou who graciously agreed to admit the patient to their service for continued care.   Clinical Impression:  1. Hypoxia   2. SOB (shortness of breath)   3. Acute on chronic congestive heart failure, unspecified heart failure type (HCC)     Clinical Complexity A medically appropriate history, review of systems, and physical exam was performed.  My independent interpretations of EKG, labs, and radiology are documented in the ED course above.   If decision rules were used in this patient's evaluation, they are listed below.   Click here for ABCD2, HEART and other calculatorsREFRESH Note before signing   Patient's presentation is most consistent with acute complicated illness / injury requiring diagnostic workup.  Medical Decision Making Amount and/or Complexity of Data Reviewed Labs: ordered. Radiology: ordered. ECG/medicine tests:  Decision-making details documented in ED Course.  Risk Prescription drug management. Decision regarding hospitalization.    HPI/ROS      See MDM section for pertinent HPI and ROS. A complete ROS was performed with pertinent positives/negatives noted above.   Past Medical History:  Diagnosis Date   Allergy    States his nose runs when he is around grease.    Anxiety    Arthritis    Atrial fibrillation (HCC)    Cancer (HCC)    Community acquired pneumonia 06/18/2017   Coronary artery disease    Gout 08/14/2015   Allopurinol  150mg . Working back to this.      HCAP (healthcare-associated pneumonia) 02/03/2018   MRSA. Sepsis/septic shock/intubation as result 01/2018  hospitalization then needed CIR     History of total knee replacement    Hypertension    Hypothyroidism    Kidney cancer, primary, with metastasis from kidney to other site [facility])    Kidney stone    Lobar pneumonia 08/29/2023   OSA (obstructive sleep apnea)    pt does not wear cpap at night   Pituitary cyst    Pneumonia    hx of    Prostate cancer (HCC)    Severe sepsis (HCC) 08/29/2023    Past Surgical History:  Procedure Laterality Date   ABLATION OF DYSRHYTHMIC FOCUS  01/28/2018   ANTERIOR CERVICAL DECOMP/DISCECTOMY FUSION N/A 08/29/2012   Procedure: ANTERIOR CERVICAL DECOMPRESSION/DISCECTOMY FUSION 1 LEVEL;  Surgeon: Alm GORMAN Molt, MD;  Location: [facility];  Service: Neurosurgery;  Laterality: N/A;   APPENDECTOMY     ATRIAL FIBRILLATION ABLATION N/A 01/28/2018   Procedure: ATRIAL FIBRILLATION ABLATION;  Surgeon: Inocencio Soyla Lunger, MD;  Location: [facility] INVASIVE CV LAB;  Service: Cardiovascular;  Laterality: N/A;   CARDIAC CATHETERIZATION  2008   clean   CHOLECYSTECTOMY     COLONOSCOPY W/ POLYPECTOMY     CRANIOTOMY N/A 11/08/2012   Procedure: CRANIOTOMY HYPOPHYSECTOMY TRANSNASAL APPROACH;  Surgeon: Darina MALVA Boehringer, MD;  Location: [facility]UR[facility];  Service: Neurosurgery;  Laterality: N/A;  Transphenoidal Resection of Pituitary Tumor   FINGER SURGERY Left 2017   Dr Camella   INSERTION PROSTATE RADIATION SEED     IR THORACENTESIS ASP PLEURAL SPACE W/IMG GUIDE  04/15/2018   KIDNEY STONE SURGERY     KNEE ARTHROSCOPY  2007   left   NEPHRECTOMY Left    POSTERIOR CERVICAL LAMINECTOMY Left 04/24/2015   Procedure: Foraminotomy cervical five - cervical six cervical six - cervical seven left;  Surgeon: Alm GORMAN Molt, MD;  Location: [facility];  Service: Neurosurgery;  Laterality: Left;   REPLACEMENT TOTAL KNEE BILATERAL     RIGHT/LEFT HEART CATH AND CORONARY ANGIOGRAPHY N/A 07/14/2019   Procedure: RIGHT/LEFT HEART CATH AND CORONARY ANGIOGRAPHY;  Surgeon: Dann Candyce GORMAN, MD;  Location: [facility] INVASIVE CV LAB;  Service: Cardiovascular;  Laterality: N/A;      Physical Exam   Vitals:   05/11/24 1654 05/11/24 1656 05/11/24 1702  BP:  135/84   Pulse:  88   Resp:  (!) 36   Temp:   97.9 F (36.6 C)  TempSrc:   Oral  SpO2:  96%   Weight: 103 kg    Height: 6' 1 (1.854 m)      Physical Exam Vitals and nursing note  reviewed.  Constitutional:      General: He is not in acute distress.    Appearance: He is well-developed.  HENT:     Head: Normocephalic and atraumatic.  Eyes:     Conjunctiva/sclera: Conjunctivae normal.  Cardiovascular:     Rate and Rhythm: Normal rate and regular rhythm.     Heart sounds: No murmur heard. Pulmonary:     Comments: Increased WOB, diminished breath sounds in the bilateral lower lobes, air movement is poor but scattered crackles Abdominal:     Palpations: Abdomen is soft.     Tenderness: There is no abdominal tenderness.  Musculoskeletal:     Cervical back: Neck supple.     Right lower leg: Edema present.     Left lower leg: Edema present.  Skin:    General: Skin is warm and dry.     Capillary Refill: Capillary refill takes less than 2 seconds.  Neurological:     Mental Status: He is alert.      Procedures   If procedures were preformed on this patient, they are listed below:  Procedures   Please note that this documentation was produced with the assistance of voice-to-text technology and may contain errors.     Billy Pal, MD 05/11/24 2040    Bernard Drivers, MD 05/11/24 2230

## 2024-05-11 NOTE — ED Triage Notes (Signed)
 Pt to the ed form home vis ems with a CC of sob x 5 hours. Pt has a hx of CODP. Pt is on 5lpm continuous o2. Pt wife relays she forgot to pay power bill and the power went off today and pt has been without o2. Pt denies any other complaints at this time. Wife relays she has now paid bill and is waiting for power to come back on.

## 2024-05-11 NOTE — H&P (Signed)
 History and Physical  Wayne Booth FMW:[id number] DOB: 1940/11/19 DOA: 05/11/2024  PCP: Katrinka Garnette KIDD, MD   Chief Complaint: Shortness of breath   HPI: Wayne Booth is a 83 y.o. male with medical history significant for HTN, CKD 2, hypothyroidism, paroxysmal atrial fibrillation, CAD, COPD, gout, chronic diastolic CHF, prostate cancer s/p seed implantation, renal cell carcinoma s/p nephrectomy, PTSD, anxiety, post-COVID pulmonary fibrosis, ILD, essential tremor, PE, OSA, chronic pain, and chronic respiratory failure with hypoxia on 5 L who presents to the ED for evaluation of shortness of breath. Patient reports the power went off and he was without oxygen  for 3-4 hours. He started having shortness of breath during this time but denies any cough, chest pain, fever, chills, abdominal pain, nausea, vomiting, headache, palpitations or dizziness. He endorses dyspnea on exertion but this is relatively unchanged for him. No sick contact or recent illnesses.  ED Course: Initial vitals show patient afebrile, RR 22-36, HR 80-100, SBP 120-130s, SpO2 98% on 5 L [place]. Initial labs overall unremarkable with normal white count, normal renal function and LFTs VBG with pCO2 of 26 otherwise unremarkable. BNP, troponin and procalcitonin still pending.  EKG shows sinus tach with APCs. CXR shows stable patchy airspace opacities in the right midlung and bilateral lung bases concerning for multifocal pneumonia, small bilateral pleural effusions. Pt received IV Lasix  20 mg and DuoNeb. TRH was consulted for admission.   Review of Systems: Please see HPI for pertinent positives and negatives. A complete 10 system review of systems are otherwise negative.  Past Medical History:  Diagnosis Date   Allergy    States his nose runs when he is around grease.    Anxiety    Arthritis    Atrial fibrillation (HCC)    Cancer (HCC)    Community acquired pneumonia 06/18/2017   Coronary artery disease    Gout 08/14/2015    Allopurinol  150mg . Working back to this.      HCAP (healthcare-associated pneumonia) 02/03/2018   MRSA. Sepsis/septic shock/intubation as result 01/2018 hospitalization then needed CIR     History of total knee replacement    Hypertension    Hypothyroidism    Kidney cancer, primary, with metastasis from kidney to other site [facility])    Kidney stone    Lobar pneumonia 08/29/2023   OSA (obstructive sleep apnea)    pt does not wear cpap at night   Pituitary cyst    Pneumonia    hx of   Prostate cancer (HCC)    Severe sepsis (HCC) 08/29/2023   Past Surgical History:  Procedure Laterality Date   ABLATION OF DYSRHYTHMIC FOCUS  01/28/2018   ANTERIOR CERVICAL DECOMP/DISCECTOMY FUSION N/A 08/29/2012   Procedure: ANTERIOR CERVICAL DECOMPRESSION/DISCECTOMY FUSION 1 LEVEL;  Surgeon: Alm GORMAN Molt, MD;  Location: [facility];  Service: Neurosurgery;  Laterality: N/A;   APPENDECTOMY     ATRIAL FIBRILLATION ABLATION N/A 01/28/2018   Procedure: ATRIAL FIBRILLATION ABLATION;  Surgeon: Inocencio Soyla Lunger, MD;  Location: [facility] LAB;  Service: Cardiovascular;  Laterality: N/A;   CARDIAC CATHETERIZATION  2008   clean   CHOLECYSTECTOMY     COLONOSCOPY W/ POLYPECTOMY     CRANIOTOMY N/A 11/08/2012   Procedure: CRANIOTOMY HYPOPHYSECTOMY TRANSNASAL APPROACH;  Surgeon: Darina KIDD Boehringer, MD;  Location: [facility];  Service: Neurosurgery;  Laterality: N/A;  Transphenoidal Resection of Pituitary Tumor   FINGER SURGERY Left 2017   Dr Camella   INSERTION PROSTATE RADIATION SEED     IR THORACENTESIS  ASP PLEURAL SPACE W/IMG GUIDE  04/15/2018   KIDNEY STONE SURGERY     KNEE ARTHROSCOPY  2007   left   NEPHRECTOMY Left    POSTERIOR CERVICAL LAMINECTOMY Left 04/24/2015   Procedure: Foraminotomy cervical five - cervical six cervical six - cervical seven left;  Surgeon: Alm GORMAN Molt, MD;  Location: [facility];  Service: Neurosurgery;  Laterality: Left;   REPLACEMENT TOTAL KNEE BILATERAL     RIGHT/LEFT HEART  CATH AND CORONARY ANGIOGRAPHY N/A 07/14/2019   Procedure: RIGHT/LEFT HEART CATH AND CORONARY ANGIOGRAPHY;  Surgeon: Dann Candyce GORMAN, MD;  Location[facility] INVASIVE CV LAB;  Service: Cardiovascular;  Laterality: N/A;   Social History:  reports that he has never smoked. He has never used smokeless tobacco. He reports current alcohol  use of about 2.0 standard drinks of alcohol  per week. He reports that he does not use drugs.  Allergies  Allergen Reactions   Ambien  [Zolpidem ] Anxiety and Other (See Comments)    Anxiety and agitation when tried as sleeper in hospital aug 2019    Family History  Problem Relation Age of Onset   Cancer Mother        stomach, died when he was young   Cancer Father        ?mouth, died before patient born   Kidney cancer Sister    Colon cancer Brother 43   Gastric cancer Son 60       stomach cancer per pt   Esophageal cancer Neg Hx    Rectal cancer Neg Hx    Stomach cancer Neg Hx      Prior to Admission medications   Medication Sig Start Date End Date Taking? Authorizing Provider  acetaminophen  (TYLENOL ) 325 MG tablet Take 2 tablets (650 mg total) by mouth every 6 (six) hours as needed for mild pain (pain score 1-3) or fever (or Fever >/= 101). 12/24/23   Arrien, Elidia Sieving, MD  albuterol  (PROVENTIL ) (2.5 MG/3ML) 0.083% nebulizer solution Take 3 mLs (2.5 mg total) by nebulization every 6 (six) hours as needed for wheezing or shortness of breath. 10/12/22   Parrett, Madelin GORMAN, NP  albuterol  (VENTOLIN  HFA) 108 (90 Base) MCG/ACT inhaler INHALE 2 PUFFS BY MOUTH EVERY 6 HOURS AS NEEDED FOR WHEEZING OR SHORTNESS OF BREATH 07/13/23   Parrett, Tammy S, NP  budesonide  (PULMICORT ) 0.25 MG/2ML nebulizer solution Take 2 mLs (0.25 mg total) by nebulization 2 (two) times daily. 09/07/23   Jillian Buttery, MD  furosemide  (LASIX ) 40 MG tablet Take 1 tablet (40 mg total) by mouth daily. 12/24/23   Arrien, Elidia Sieving, MD  gabapentin  (NEURONTIN ) 300 MG capsule TAKE 1 CAPSULE  BY MOUTH 3 TIMES A DAY 01/10/24   Katrinka Garnette KIDD, MD  ipratropium-albuterol  (DUONEB) 0.5-2.5 (3) MG/3ML SOLN Take 3 mLs by nebulization every 6 (six) hours as needed. 09/07/23   Jillian Buttery, MD  ondansetron  (ZOFRAN ) 4 MG tablet Take 1 tablet (4 mg total) by mouth every 6 (six) hours as needed for nausea. 09/17/23   Will Almarie MATSU, MD  OXYGEN  Inhale 5 L into the lungs continuous.    [provider]  sertraline  (ZOLOFT ) 100 MG tablet Take 1.5 tablets (150 mg total) by mouth daily. 04/07/24   Katrinka Garnette KIDD, MD  spironolactone  (ALDACTONE ) 25 MG tablet Take 1 tablet (25 mg total) by mouth daily. 12/25/23 01/30/25  Arrien, Mauricio Daniel, MD  traMADol  (ULTRAM ) 50 MG tablet Take 50 mg by mouth every 6 (six) hours as needed for moderate pain (  pain score 4-6).    [provider]  traZODone  (DESYREL ) 50 MG tablet TAKE 1 TABLET BY MOUTH AT BEDTIME 05/02/24   Katrinka Garnette KIDD, MD    Physical Exam: BP 130/75 (BP Location: Right Arm)   Pulse (!) 102   Temp 97.9 F (36.6 C) (Oral)   Resp (!) 22   Ht 6' 1 (1.854 m)   Wt 103 kg   SpO2 98%   BMI 29.96 kg/m  General: Pleasant, weak-appearing elderly male laying in bed. No acute distress. HEENT: [place]/AT. Anicteric sclera CV: Tachycardic. Regular rhythm. No murmurs, rubs, or gallops. No LE edema Pulmonary: Tachypneic. On 5 L [place]. Lungs CTAB. Diffuse rales, R>L. No wheezing or rhonchi. Diminished lung sounds at the bases. Abdominal: Soft, nontender, nondistended. Normal bowel sounds. Extremities: Palpable radial and DP pulses. Normal ROM. Skin: Warm and dry. No obvious rash or lesions. Neuro: A&Ox3. Moves all extremities. Normal sensation to light touch. No focal deficit. Psych: Normal mood and affect          Labs on Admission:  Basic Metabolic Panel: Recent Labs  Lab 05/11/24 1707 05/11/24 1747  NA 139 140  140  K 4.6 4.5  4.5  CL 102 101  CO2 27  --   GLUCOSE 62* 60*  BUN 17 22  CREATININE 1.06 1.10  CALCIUM  8.9  --    Liver Function Tests: Recent Labs  Lab 05/11/24 1707  AST 41  ALT 36  ALKPHOS 68  BILITOT 0.4  PROT 8.3*  ALBUMIN  3.2*   No results for input(s): LIPASE, AMYLASE in the last 168 hours. No results for input(s): AMMONIA in the last 168 hours. CBC: Recent Labs  Lab 05/11/24 1707 05/11/24 1747  WBC 8.3  --   NEUTROABS 5.5  --   HGB 15.8 16.3  16.7  HCT 49.4 48.0  49.0  MCV 93.4  --   PLT 306  --    Cardiac Enzymes: No results for input(s): CKTOTAL, CKMB, CKMBINDEX, TROPONINI in the last 168 hours. BNP (last 3 results) Recent Labs    12/20/23 2333 12/31/23 0116 01/31/24 1010  BNP 92.4 41.3 52.7    ProBNP (last 3 results) No results for input(s): PROBNP in the last 8760 hours.  CBG: No results for input(s): GLUCAP in the last 168 hours.  Radiological Exams on Admission: DG Chest Portable 1 View Result Date: 05/11/2024 CLINICAL DATA:  Cough. EXAM: PORTABLE CHEST 1 VIEW COMPARISON:  chest x-ray 01/31/2024. Chest CT 11/28/2023. FINDINGS: There are patchy airspace opacities in the right mid lung and bilateral lung bases. There are small bilateral pleural effusions. Cardiomediastinal silhouette is enlarged unchanged. No pneumothorax or acute fracture. IMPRESSION: 1. Stable patchy airspace opacities in the right mid lung and bilateral lung bases, concerning for multifocal pneumonia. 2. Small bilateral pleural effusions. Electronically Signed   By: Greig Pique M.D.   On: 05/11/2024 17:46   Assessment/Plan Wayne Booth is a 83 y.o. male with medical history significant for HTN, CKD 2, hypothyroidism, paroxysmal atrial fibrillation, CAD, COPD, gout, chronic diastolic CHF, prostate cancer s/p seed implantation, renal cell carcinoma s/p nephrectomy, PTSD, anxiety, post-COVID pulmonary fibrosis, ILD, essential tremor, PE, OSA, chronic pain, and chronic respiratory failure with hypoxia on 5 L who presents to the ED for evaluation of shortness of  breath   # Acute on chronic diastolic HF - Last TTE in June 2025 showed EF EF 55%, G1DD and normal RV function - Pt presented with shortness of breath and  persistent dyspnea on exertion - Patient with mild hypervolemia, has diffuse rales and opacities on CXR likely due to interstitial edema - S/p IV Lasix  20 mg in the ED, will give additional 20 mg and start IV Lasix  40 mg daily - Continue spironolactone  - Strict I&O, daily weights - Maintain K+ > 4.0, Mag > 2.0 - Telemetry  # Patchy airspace opacity - Patient with CXR showing stable patchy airspace opacities in the right midlung and bilateral lung bases concerning for multifocal pneumonia. - X-ray with similar findings during hospitalization for CHF exacerbation in August - Patient with notable fevers, chills or cough, low concern for pneumonia - Follow-up procalcitonin and BNP - Trend CBC and fever curve  # COPD exacerbation # Hx of ILD/post-COVID pulmonary fibrosis # Chronic hypoxic respiratory failure - Patient with no wheezing or cough to indicate acute exacerbation - Status post DuoNeb in the ED - Continue home bronchodilators and start as needed DuoNeb - Incentive spirometer, flutter valve - Continue supplemental O2  # HTN - BP stable with SBP in the 120s to 130s - Continue spironolactone   # Paroxysmal A-fib - EKG shows sinus rhythm with occasional PACs, HR stable in the 80s-100s - Not on anticoagulation or rate control medication - Telemetry  # History of PE - Patient no longer on anticoagulation  # Anxiety # PTSD - Continue sertraline  and trazodone   # History of renal cell carcinoma - Status post nephrectomy  # History of prostate cancer - Status post seed implantation  # OSA - Not on CPAP at night  # Generalized weakness - Likely secondary to chronic respiratory problems - PT/OT eval and treat  DVT prophylaxis: Lovenox      Code Status: Limited: Do not attempt resuscitation (DNR) -DNR-LIMITED -Do  Not Intubate/DNI   Consults called: None  Family Communication: Discussed admission with spouse, Ezzie, over the phone  Severity of Illness: The appropriate patient status for this patient is OBSERVATION. Observation status is judged to be reasonable and necessary in order to provide the required intensity of service to ensure the patient's safety. The patient's presenting symptoms, physical exam findings, and initial radiographic and laboratory data in the context of their medical condition is felt to place them at decreased risk for further clinical deterioration. Furthermore, it is anticipated that the patient will be medically stable for discharge from the hospital within 2 midnights of admission.   Level of care: Telemetry    Lou Claretta HERO, MD 05/11/2024, 9:22 PM [facility] Pager: [phone] Isaiah 41:10   If 7PM-7AM, please contact night-coverage [email or web address] Password [email or web address]

## 2024-05-12 ENCOUNTER — Inpatient Hospital Stay (HOSPITAL_COMMUNITY)

## 2024-05-12 ENCOUNTER — Encounter (HOSPITAL_COMMUNITY): Payer: Self-pay | Admitting: Student

## 2024-05-12 DIAGNOSIS — J8410 Pulmonary fibrosis, unspecified: Secondary | ICD-10-CM | POA: Diagnosis present

## 2024-05-12 DIAGNOSIS — E039 Hypothyroidism, unspecified: Secondary | ICD-10-CM | POA: Diagnosis present

## 2024-05-12 DIAGNOSIS — E1122 Type 2 diabetes mellitus with diabetic chronic kidney disease: Secondary | ICD-10-CM | POA: Diagnosis present

## 2024-05-12 DIAGNOSIS — R0602 Shortness of breath: Secondary | ICD-10-CM | POA: Diagnosis present

## 2024-05-12 DIAGNOSIS — U099 Post covid-19 condition, unspecified: Secondary | ICD-10-CM | POA: Diagnosis present

## 2024-05-12 DIAGNOSIS — E669 Obesity, unspecified: Secondary | ICD-10-CM | POA: Diagnosis present

## 2024-05-12 DIAGNOSIS — Z96653 Presence of artificial knee joint, bilateral: Secondary | ICD-10-CM | POA: Diagnosis present

## 2024-05-12 DIAGNOSIS — I5033 Acute on chronic diastolic (congestive) heart failure: Secondary | ICD-10-CM | POA: Diagnosis present

## 2024-05-12 DIAGNOSIS — K567 Ileus, unspecified: Secondary | ICD-10-CM | POA: Diagnosis not present

## 2024-05-12 DIAGNOSIS — F419 Anxiety disorder, unspecified: Secondary | ICD-10-CM | POA: Diagnosis present

## 2024-05-12 DIAGNOSIS — G4733 Obstructive sleep apnea (adult) (pediatric): Secondary | ICD-10-CM | POA: Diagnosis present

## 2024-05-12 DIAGNOSIS — Z66 Do not resuscitate: Secondary | ICD-10-CM | POA: Diagnosis present

## 2024-05-12 DIAGNOSIS — N179 Acute kidney failure, unspecified: Secondary | ICD-10-CM | POA: Diagnosis present

## 2024-05-12 DIAGNOSIS — I480 Paroxysmal atrial fibrillation: Secondary | ICD-10-CM | POA: Diagnosis present

## 2024-05-12 DIAGNOSIS — J449 Chronic obstructive pulmonary disease, unspecified: Secondary | ICD-10-CM | POA: Diagnosis present

## 2024-05-12 DIAGNOSIS — I2510 Atherosclerotic heart disease of native coronary artery without angina pectoris: Secondary | ICD-10-CM | POA: Diagnosis present

## 2024-05-12 DIAGNOSIS — Z515 Encounter for palliative care: Secondary | ICD-10-CM | POA: Diagnosis not present

## 2024-05-12 DIAGNOSIS — R0902 Hypoxemia: Secondary | ICD-10-CM | POA: Diagnosis not present

## 2024-05-12 DIAGNOSIS — Z905 Acquired absence of kidney: Secondary | ICD-10-CM | POA: Diagnosis not present

## 2024-05-12 DIAGNOSIS — N182 Chronic kidney disease, stage 2 (mild): Secondary | ICD-10-CM | POA: Diagnosis present

## 2024-05-12 DIAGNOSIS — I130 Hypertensive heart and chronic kidney disease with heart failure and stage 1 through stage 4 chronic kidney disease, or unspecified chronic kidney disease: Secondary | ICD-10-CM | POA: Diagnosis present

## 2024-05-12 DIAGNOSIS — J9611 Chronic respiratory failure with hypoxia: Secondary | ICD-10-CM | POA: Diagnosis present

## 2024-05-12 DIAGNOSIS — S2243XA Multiple fractures of ribs, bilateral, initial encounter for closed fracture: Secondary | ICD-10-CM | POA: Diagnosis not present

## 2024-05-12 DIAGNOSIS — Z79899 Other long term (current) drug therapy: Secondary | ICD-10-CM | POA: Diagnosis not present

## 2024-05-12 DIAGNOSIS — I509 Heart failure, unspecified: Secondary | ICD-10-CM | POA: Diagnosis not present

## 2024-05-12 DIAGNOSIS — R531 Weakness: Secondary | ICD-10-CM | POA: Diagnosis present

## 2024-05-12 DIAGNOSIS — W19XXXA Unspecified fall, initial encounter: Secondary | ICD-10-CM | POA: Diagnosis not present

## 2024-05-12 DIAGNOSIS — Z9981 Dependence on supplemental oxygen: Secondary | ICD-10-CM | POA: Diagnosis not present

## 2024-05-12 DIAGNOSIS — G8929 Other chronic pain: Secondary | ICD-10-CM | POA: Diagnosis present

## 2024-05-12 LAB — BASIC METABOLIC PANEL WITH GFR
Anion gap: 13 (ref 5–15)
BUN: 19 mg/dL (ref 8–23)
CO2: 25 mmol/L (ref 22–32)
Calcium: 8.5 mg/dL — ABNORMAL LOW (ref 8.9–10.3)
Chloride: 95 mmol/L — ABNORMAL LOW (ref 98–111)
Creatinine, Ser: 1.22 mg/dL (ref 0.61–1.24)
GFR, Estimated: 60 mL/min — ABNORMAL LOW (ref >60)
Glucose, Bld: 145 mg/dL — ABNORMAL HIGH (ref 70–99)
Potassium: 4.2 mmol/L (ref 3.5–5.1)
Sodium: 133 mmol/L — ABNORMAL LOW (ref 135–145)

## 2024-05-12 LAB — CBC
HCT: 46.7 % (ref 39.0–52.0)
Hemoglobin: 15.5 g/dL (ref 13.0–17.0)
MCH: 29.8 pg (ref 26.0–34.0)
MCHC: 33.2 g/dL (ref 30.0–36.0)
MCV: 89.6 fL (ref 80.0–100.0)
Platelets: 304 K/uL (ref 150–400)
RBC: 5.21 MIL/uL (ref 4.22–5.81)
RDW: 13.6 % (ref 11.5–15.5)
WBC: 7.1 K/uL (ref 4.0–10.5)
nRBC: 0 % (ref 0.0–0.2)

## 2024-05-12 LAB — PROCALCITONIN: Procalcitonin: <0.1 ng/mL

## 2024-05-12 LAB — CBG MONITORING, ED: Glucose-Capillary: 103 mg/dL — ABNORMAL HIGH (ref 70–99)

## 2024-05-12 LAB — MAGNESIUM: Magnesium: 1.7 mg/dL (ref 1.7–2.4)

## 2024-05-12 MED ORDER — HYDROCODONE-ACETAMINOPHEN 5-325 MG PO TABS
1.0000 | ORAL_TABLET | Freq: Once | ORAL | Status: AC
  Administered 2024-05-12: 1 via ORAL
  Filled 2024-05-12: qty 1

## 2024-05-12 MED ORDER — FUROSEMIDE 10 MG/ML IJ SOLN
40.0000 mg | Freq: Two times a day (BID) | INTRAMUSCULAR | Status: AC
  Administered 2024-05-12 – 2024-05-13 (×3): 40 mg via INTRAVENOUS
  Filled 2024-05-12 (×3): qty 4

## 2024-05-12 MED ORDER — EMPAGLIFLOZIN 10 MG PO TABS
10.0000 mg | ORAL_TABLET | Freq: Every day | ORAL | Status: DC
  Administered 2024-05-12 – 2024-05-18 (×7): 10 mg via ORAL
  Filled 2024-05-12 (×7): qty 1

## 2024-05-12 NOTE — Progress Notes (Signed)
 PROGRESS NOTE    Wayne Booth  FMW:[id number] DOB: 1940/08/20 DOA: 05/11/2024 PCP: Katrinka Garnette KIDD, MD  83 y.o. male chronically ill with history of COPD, interstitial lung disease, chronic respiratory failure on 5 L home O2, diastolic CHF, CKD, PAF, CAD, RCC with nephrectomy, prostate cancer, anxiety, post-COVID pulmonary fibrosis, history of PE, OSA, chronic pain, presented to the ED with shortness of breath.  He does have some dyspnea on exertion with any activity, however yesterday he lost power for a few hours thereafter developed worsening dyspnea as he was without oxygen , called EMS, placed on O2 and brought to the ED, noted to be tachypneic, otherwise stable on 5 L O2, BNP 50, troponin 10, chest x-ray noted patchy airspace opacities in right midlung and bilateral lung bases as well as small bilateral pleural effusions  Subjective: -Patient seen in the ED, sitting in the recliner, reports mild dyspnea on exertion more than his baseline  Assessment and Plan:  Acute on chronic diastolic HF - Last TTE in June 2025 showed EF EF 55%, G1DD and normal RV function - Pt presented with shortness of breath and persistent dyspnea on exertion - Patient with mild hypervolemia, has diffuse rales and opacities on CXR likely due to interstitial edema and small pleural effusions - Continue Lasix  40 mg twice daily today, Aldactone , Jardiance  - Monitor I's/O, daily weights, BMP in a.m. -Increase activity, PT eval   Abnormal x-ray, patchy airspace opacity - Patient with CXR showing stable patchy airspace opacities in the right midlung and bilateral lung bases concerning for multifocal pneumonia. - X-ray with similar findings during hospitalization for CHF exacerbation in August - Patient with notable fevers, chills or cough, low concern for pneumonia - Procalcitonin is low, suspect chronic findings, monitor without antibiotics   # COPD # Hx of ILD/post-COVID pulmonary fibrosis # Chronic hypoxic  respiratory failure - Continue home bronchodilators and start as needed DuoNeb - Incentive spirometer, flutter valve - Continue supplemental O2   # HTN - BP stable with SBP in the 120s to 130s - Continue spironolactone    # Paroxysmal A-fib - EKG shows sinus rhythm with occasional PACs, HR stable in the 80s-100s - Not on anticoagulation or rate control medication   # History of PE - Patient no longer on anticoagulation   # Anxiety # PTSD - Continue sertraline  and trazodone    # History of renal cell carcinoma - Status post nephrectomy   # History of prostate cancer - Status post seed implantation   # OSA - Not on CPAP at night   # Generalized weakness - Likely secondary to chronic respiratory problems - PT/OT eval and treat   DVT prophylaxis: lovenox  Code Status: DNR Family Communication: none present Disposition Plan: Home likely 1 to 2 days  Consultants:    Procedures:   Antimicrobials:    Objective: Vitals:   05/12/24 0840 05/12/24 0915 05/12/24 1100 05/12/24 1106  BP: (!) 121/100 (!) 101/55  111/77  Pulse: 95 83 87 81  Resp:  (!) 34 15 (!) 31  Temp:    97.8 F (36.6 C)  TempSrc:    Oral  SpO2: 95% 100% 95% 97%  Weight:      Height:        Intake/Output Summary (Last 24 hours) at 05/12/2024 1201 Last data filed at 05/12/2024 1037 Gross per 24 hour  Intake --  Output 600 ml  Net -600 ml   Filed Weights   05/11/24 1654  Weight: 103 kg  Examination:  General exam: Appears calm and comfortable chronically ill, AO x 3 Respiratory system: Fine bilateral rales Cardiovascular system: S1 & S2 heard, RRR.  Abd: nondistended, soft and nontender.Normal bowel sounds heard. Central nervous system: Alert and oriented. No focal neurological deficits. Extremities: Trace edema Skin: No rashes Psychiatry:  Mood & affect appropriate.     Data Reviewed:   CBC: Recent Labs  Lab 05/11/24 1707 05/11/24 1747  WBC 8.3  --   NEUTROABS 5.5  --    HGB 15.8 16.3  16.7  HCT 49.4 48.0  49.0  MCV 93.4  --   PLT 306  --    Basic Metabolic Panel: Recent Labs  Lab 05/11/24 1707 05/11/24 1747  NA 139 140  140  K 4.6 4.5  4.5  CL 102 101  CO2 27  --   GLUCOSE 62* 60*  BUN 17 22  CREATININE 1.06 1.10  CALCIUM 8.9  --    GFR: Estimated Creatinine Clearance: 66.4 mL/min (by C-G formula based on SCr of 1.1 mg/dL). Liver Function Tests: Recent Labs  Lab 05/11/24 1707  AST 41  ALT 36  ALKPHOS 68  BILITOT 0.4  PROT 8.3*  ALBUMIN  3.2*   No results for input(s): LIPASE, AMYLASE in the last 168 hours. No results for input(s): AMMONIA in the last 168 hours. Coagulation Profile: No results for input(s): INR, PROTIME in the last 168 hours. Cardiac Enzymes: No results for input(s): CKTOTAL, CKMB, CKMBINDEX, TROPONINI in the last 168 hours. BNP (last 3 results) No results for input(s): PROBNP in the last 8760 hours. HbA1C: No results for input(s): HGBA1C in the last 72 hours. CBG: Recent Labs  Lab 05/12/24 0632  GLUCAP 103*   Lipid Profile: No results for input(s): CHOL, HDL, LDLCALC, TRIG, CHOLHDL, LDLDIRECT in the last 72 hours. Thyroid  Function Tests: No results for input(s): TSH, T4TOTAL, FREET4, T3FREE, THYROIDAB in the last 72 hours. Anemia Panel: No results for input(s): VITAMINB12, FOLATE, FERRITIN, TIBC, IRON, RETICCTPCT in the last 72 hours. Urine analysis:    Component Value Date/Time   COLORURINE AMBER (A) 01/31/2024 0950   APPEARANCEUR HAZY (A) 01/31/2024 0950   LABSPEC 1.016 01/31/2024 0950   PHURINE 5.0 01/31/2024 0950   GLUCOSEU NEGATIVE 01/31/2024 0950   HGBUR LARGE (A) 01/31/2024 0950   HGBUR 2+ 09/22/2007 1444   BILIRUBINUR NEGATIVE 01/31/2024 0950   BILIRUBINUR n 11/07/2015 1417   KETONESUR NEGATIVE 01/31/2024 0950   PROTEINUR 30 (A) 01/31/2024 0950   UROBILINOGEN 1.0 11/07/2015 1417   UROBILINOGEN 1.0 05/22/2011 1454   NITRITE  NEGATIVE 01/31/2024 0950   LEUKOCYTESUR NEGATIVE 01/31/2024 0950   Sepsis Labs: @LABRCNTIP (procalcitonin:4,lacticidven:4)  )No results found for this or any previous visit (from the past 240 hours).   Radiology Studies: DG Chest Portable 1 View Result Date: 05/11/2024 CLINICAL DATA:  Cough. EXAM: PORTABLE CHEST 1 VIEW COMPARISON:  chest x-ray 01/31/2024. Chest CT 11/28/2023. FINDINGS: There are patchy airspace opacities in the right mid lung and bilateral lung bases. There are small bilateral pleural effusions. Cardiomediastinal silhouette is enlarged unchanged. No pneumothorax or acute fracture. IMPRESSION: 1. Stable patchy airspace opacities in the right mid lung and bilateral lung bases, concerning for multifocal pneumonia. 2. Small bilateral pleural effusions. Electronically Signed   By: Greig Pique M.D.   On: 05/11/2024 17:46     Scheduled Meds:  budesonide   0.25 mg Nebulization BID   empagliflozin   10 mg Oral Daily   enoxaparin  (LOVENOX ) injection  40 mg Subcutaneous Q24H  furosemide   40 mg Intravenous BID   sertraline   150 mg Oral Daily   spironolactone   25 mg Oral Daily   traZODone   50 mg Oral QHS   Continuous Infusions:   LOS: 0 days    Time spent:    Sigurd Pac, MD [facility]   05/12/2024, 12:01 PM

## 2024-05-12 NOTE — Plan of Care (Signed)
   Problem: Clinical Measurements: Goal: Ability to maintain clinical measurements within normal limits will improve Outcome: Progressing

## 2024-05-12 NOTE — ED Notes (Signed)
 PT at [facility].

## 2024-05-12 NOTE — Evaluation (Signed)
 Physical Therapy Evaluation Patient Details Name: Wayne Booth MRN: [id number] DOB: [birth year] Today's Date: 05/12/2024  History of Present Illness  Wayne Booth is a 83 y.o. male who presented to [facility] ED 05/11/24 for SOB after the power went off and he was without oxygen  for 3-4 hours. EKG shows sinus tach with APCs. CXR shows stable patchy airspace opacities in the right midlung and bilateral lung bases concerning for multifocal pneumonia, small bilateral pleural effusions. PMHx: HTN, CKD 2, hypothyroidism, paroxysmal atrial fibrillation, CAD, COPD, gout, chronic diastolic CHF, prostate cancer s/p seed implantation, renal cell carcinoma s/p nephrectomy, PTSD, anxiety, post-COVID pulmonary fibrosis, ILD, essential tremor, PE, OSA, chronic pain, and chronic respiratory failure with hypoxia on 5 L.   Clinical Impression  Pt admitted with above diagnosis. PTA, pt was modI with functional mobility taking increased time to mobilize within his home without an AD. He reports a relatively sedentary lifestyle including staying in his recliner chair for the majority of the day and rarely leaving the house. He lives with his wife in a two-story condo with a level entry. Pt is able to reside on the main level. Pt currently with functional limitations due to the deficits listed below (see PT Problem List). He required CGA for bed mobility and minA for sit<>stand. Pt took a couple of side steps to the right along EOB with minA. He is primarily limited by impaired cardiopulmonary endurance. Pt's RR increased into the 30-40s with mobility. Cued PLB technique and allowed seated rest break for recovery into the 15-20s. Pt will benefit from acute skilled PT to increase his independence and safety with mobility to allow discharge. Recommend HHPT to maintain strength, improve balance, advance activity tolerance, decrease fall risk, and optimize safety within the home environment.      If plan is discharge home,  recommend the following: A little help with walking and/or transfers;A little help with bathing/dressing/bathroom;Assistance with cooking/housework;Assist for transportation;Help with stairs or ramp for entrance   Can travel by private vehicle        Equipment Recommendations Wheelchair (measurements PT);Wheelchair cushion (measurements PT);Rollator (4 wheels)  Recommendations for Other Services       Functional Status Assessment Patient has had a recent decline in their functional status and demonstrates the ability to make significant improvements in function in a reasonable and predictable amount of time.     Precautions / Restrictions Precautions Precautions: Fall Recall of Precautions/Restrictions: Intact Precaution/Restrictions Comments: watch RR Restrictions Weight Bearing Restrictions Per Provider Order: No      Mobility  Bed Mobility Overal bed mobility: Needs Assistance Bed Mobility: Supine to Sit, Sit to Supine     Supine to sit: Contact guard, HOB elevated Sit to supine: Contact guard assist   General bed mobility comments: Pt sat up on R side of bed with increased time. He managed BLE. CGA at trunk for safety. Returning to bed pt required asssit to bring BLE back in. Repositioned using bed features and +2 assist.    Transfers Overall transfer level: Needs assistance Equipment used: None Transfers: Sit to/from Stand Sit to Stand: Min assist           General transfer comment: Pt stood from lowest bed height. He pushed up with BUE support. MinA to power up into static stance. Good eccentric control with sitting.    Ambulation/Gait Ambulation/Gait assistance: Min assist Gait Distance (Feet): 2 Feet Assistive device: None Gait Pattern/deviations: Step-to pattern Gait velocity: decreased     General Gait Details:  Pt took a couple of lateral side steps to the right along EOB to get higher in bed prior to sitting.  Stairs            Wheelchair  Mobility     Tilt Bed    Modified Rankin (Stroke Patients Only)       Balance Overall balance assessment: Needs assistance Sitting-balance support: Bilateral upper extremity supported, Feet supported Sitting balance-Leahy Scale: Fair     Standing balance support: No upper extremity supported, During functional activity Standing balance-Leahy Scale: Fair Standing balance comment: Pt maintained static stance for <30 seconds.                             Pertinent Vitals/Pain Pain Assessment Pain Assessment: No/denies pain    Home Living Family/patient expects to be discharged to:: Private residence Living Arrangements: Spouse/significant other (Wife) Available Help at Discharge: Family;Available 24 hours/day Type of Home: House (Condo) Home Access: Level entry       Home Layout: Two level;Able to live on main level with bedroom/bathroom Home Equipment: Shower seat - built [employer]raiser (2 wheels);Cane - single point;Hand held shower head;Other (comment) (O2 concentrator)      Prior Function Prior Level of Function : Independent/Modified Independent;Driving             Mobility Comments: Pt reports he is able to transfer and ambulate within the home without an AD. He states he goes out occasionally with his wife. Denies fall hx. Pt reports he spends most of his time sitting in his recliner chair watching tv. ADLs Comments: ModI with ADLs. Sits on shower chair to bath. Wife manages medications. He drives.     Extremity/Trunk Assessment   Upper Extremity Assessment Upper Extremity Assessment: Defer to OT evaluation    Lower Extremity Assessment Lower Extremity Assessment: Overall WFL for tasks assessed    Cervical / Trunk Assessment Cervical / Trunk Assessment: Other exceptions Cervical / Trunk Exceptions: increased body habitus  Communication   Communication Communication: Impaired Factors Affecting Communication: Hearing impaired     Cognition Arousal: Alert Behavior During Therapy: WFL for tasks assessed/performed   PT - Cognitive impairments: No apparent impairments                       PT - Cognition Comments: Pt A,Ox4 Following commands: Intact       Cueing Cueing Techniques: Verbal cues     General Comments General comments (skin integrity, edema, etc.): Pt on 5L O2 via [place]. His RR increased to 30-40s with a max of 43 with activity. Cued PLB technique.    Exercises     Assessment/Plan    PT Assessment Patient needs continued PT services  PT Problem List Decreased activity tolerance;Decreased balance;Decreased mobility;Cardiopulmonary status limiting activity       PT Treatment Interventions DME instruction;Gait training;Functional mobility training;Therapeutic activities;Therapeutic exercise;Balance training;Patient/family education    PT Goals (Current goals can be found in the Care Plan section)  Acute Rehab PT Goals Patient Stated Goal: Return Home PT Goal Formulation: With patient Time For Goal Achievement: 05/26/24 Potential to Achieve Goals: Good    Frequency Min 2X/week     Co-evaluation               AM-PAC PT 6 Clicks Mobility  Outcome Measure Help needed turning from your back to your side while in a flat bed without using bedrails?: A Little Help needed  moving from lying on your back to sitting on the side of a flat bed without using bedrails?: A Little Help needed moving to and from a bed to a chair (including a wheelchair)?: A Little Help needed standing up from a chair using your arms (e.g., wheelchair or bedside chair)?: A Lot Help needed to walk in hospital room?: A Lot Help needed climbing 3-5 steps with a railing? : A Lot 6 Click Score: 15    End of Session Equipment Utilized During Treatment: Gait belt;Oxygen  Activity Tolerance: Patient tolerated treatment well Patient left: in bed;with call bell/phone within reach Nurse Communication: Mobility  status PT Visit Diagnosis: Difficulty in walking, not elsewhere classified (R26.2);Other abnormalities of gait and mobility (R26.89);Unsteadiness on feet (R26.81)    Time: 9171-9154 PT Time Calculation (min) (ACUTE ONLY): 17 min   Charges:   PT Evaluation $PT Eval Moderate Complexity: 1 Mod   PT General Charges $$ ACUTE PT VISIT: 1 Visit         Randall SAUNDERS, PT, DPT Acute Rehabilitation Services Office: [phone] Secure Chat Preferred  Delon CHRISTELLA Callander 05/12/2024, 9:22 AM

## 2024-05-12 NOTE — ED Notes (Signed)
OT at BS.  

## 2024-05-12 NOTE — Evaluation (Signed)
 Occupational Therapy Evaluation Patient Details Name: RODRIGO MCGRANAHAN MRN: [id number] DOB: 18-Dec-1940 Today's Date: 05/12/2024   History of Present Illness   Wayne Booth is a 83 y.o. male who presented to [facility] ED 05/11/24 for SOB after the power went off and he was without oxygen  for 3-4 hours. EKG shows sinus tach with APCs. CXR shows stable patchy airspace opacities in the right midlung and bilateral lung bases concerning for multifocal pneumonia, small bilateral pleural effusions. PMHx: HTN, CKD 2, hypothyroidism, paroxysmal atrial fibrillation, CAD, COPD, gout, chronic diastolic CHF, prostate cancer s/p seed implantation, renal cell carcinoma s/p nephrectomy, PTSD, anxiety, post-COVID pulmonary fibrosis, ILD, essential tremor, PE, OSA, chronic pain, and chronic respiratory failure with hypoxia on 5 L.     Clinical Impressions Prior to this admission, patient living with his wife, minimally ambulates, and prefers to sit in his chair and watch TV. He requires occasional assist with ADLs, and his wife manages IADLs. Patient still drives. Currently, patient on 5L O2 (which is his baseline), with increased RR with all movement. Assisted patient with NT to chair for ease of breathing at min A of 2 (no RW present). Patient with stable O2 level on 5L but increased RR noted. Patient is min A for ADL management and transfers. OT recommending HHOT at discharge, OT will continue to follow acutely.      If plan is discharge home, recommend the following:   A little help with walking and/or transfers;A lot of help with bathing/dressing/bathroom;Help with stairs or ramp for entrance;Assistance with cooking/housework;Assist for transportation     Functional Status Assessment   Patient has had a recent decline in their functional status and demonstrates the ability to make significant improvements in function in a reasonable and predictable amount of time.     Equipment Recommendations   None  recommended by OT     Recommendations for Other Services         Precautions/Restrictions   Precautions Precautions: Fall Recall of Precautions/Restrictions: Intact Precaution/Restrictions Comments: watch RR Restrictions Weight Bearing Restrictions Per Provider Order: No     Mobility Bed Mobility Overal bed mobility: Needs Assistance Bed Mobility: Supine to Sit     Supine to sit: Contact guard, HOB elevated     General bed mobility comments: Pt sat up on R side of bed with CGA at trunk for safety    Transfers Overall transfer level: Needs assistance Equipment used: 2 person hand held assist Transfers: Sit to/from Stand Sit to Stand: Min assist, +2 safety/equipment           General transfer comment: 2 person HHA from stretcher as no RW present, able to complete stand pivot to chair with VSS      Balance Overall balance assessment: Needs assistance Sitting-balance support: Bilateral upper extremity supported, Feet supported Sitting balance-Leahy Scale: Fair     Standing balance support: During functional activity, Reliant on assistive device for balance, Bilateral upper extremity supported, Single extremity supported Standing balance-Leahy Scale: Poor Standing balance comment: Reliant on external assist for transfer                           ADL either performed or assessed with clinical judgement   ADL Overall ADL's : Needs assistance/impaired Eating/Feeding: Set up;Sitting   Grooming: Set up;Sitting   Upper Body Bathing: Contact guard assist;Sitting   Lower Body Bathing: Moderate assistance;Sitting/lateral leans;Sit to/from stand   Upper Body Dressing : Contact guard assist;Sitting  Lower Body Dressing: Moderate assistance;Sitting/lateral leans;Sit to/from stand   Toilet Transfer: Minimal assistance;+2 for physical assistance;BSC/3in1 Toilet Transfer Details (indicate cue type and reason): simulated with stand pivot to  chair Toileting- Clothing Manipulation and Hygiene: Minimal assistance;Sitting/lateral lean;Sit to/from stand       Functional mobility during ADLs: Minimal assistance;Cueing for safety;Cueing for sequencing General ADL Comments: Prior to this admission, patient living with his wife, minimally ambulates, and prefers to sit in his chair and watch TV. He requires occasional assist with ADLs, and his wife manages IADLs. Patient still drives. Currently, patient on 5L O2 (which is his baseline), with increased RR with all movement. Assisted patient with NT to chair for ease of breathing at min A of 2 (no RW present). Patient with stable O2 level on 5L but increased RR noted. Patient is min A for ADL management and transfers. OT recommending HHOT at discharge, OT will continue to follow acutely.     Vision Baseline Vision/History: 1 Wears glasses Ability to See in Adequate Light: 0 Adequate Patient Visual Report: No change from baseline Vision Assessment?: No apparent visual deficits     Perception Perception: Not tested       Praxis Praxis: Not tested       Pertinent Vitals/Pain Pain Assessment Pain Assessment: No/denies pain     Extremity/Trunk Assessment Upper Extremity Assessment Upper Extremity Assessment: Generalized weakness;Right hand dominant   Lower Extremity Assessment Lower Extremity Assessment: Defer to PT evaluation   Cervical / Trunk Assessment Cervical / Trunk Assessment: Other exceptions Cervical / Trunk Exceptions: increased body habitus   Communication Communication Communication: Impaired Factors Affecting Communication: Hearing impaired   Cognition Arousal: Alert Behavior During Therapy: WFL for tasks assessed/performed Cognition: No apparent impairments                               Following commands: Intact       Cueing  General Comments   Cueing Techniques: Verbal cues  5L O2 92-96% increased WOB and RR with transfer    Exercises     Shoulder Instructions      Home Living Family/patient expects to be discharged to:: Private residence Living Arrangements: Spouse/significant other (Wife) Available Help at Discharge: Family;Available 24 hours/day Type of Home: House (Condo) Home Access: Level entry     Home Layout: Two level;Able to live on main level with bedroom/bathroom     Bathroom Shower/Tub: Producer, Television/film/video: Standard     Home Equipment: Shower seat - built [employer]raiser (2 wheels);Cane - single point;Hand held shower head;Other (comment) (O2 concentrator)          Prior Functioning/Environment Prior Level of Function : Independent/Modified Independent;Driving             Mobility Comments: Pt reports he is able to transfer and ambulate within the home without an AD. He states he goes out occasionally with his wife. Denies fall hx. Pt reports he spends most of his time sitting in his recliner chair watching tv. ADLs Comments: ModI with ADLs. Sits on shower chair to bath. Wife manages medications. He drives.    OT Problem List: Decreased activity tolerance;Cardiopulmonary status limiting activity;Obesity   OT Treatment/Interventions: Self-care/ADL training;Therapeutic exercise;Energy conservation;DME and/or AE instruction;Manual therapy;Patient/family education;Balance training      OT Goals(Current goals can be found in the care plan section)   Acute Rehab OT Goals Patient Stated Goal: to get better OT Goal Formulation:  With patient Time For Goal Achievement: 05/26/24 Potential to Achieve Goals: Good   OT Frequency:  Min 2X/week    Co-evaluation              AM-PAC OT 6 Clicks Daily Activity     Outcome Measure Help from another person eating meals?: A Little Help from another person taking care of personal grooming?: A Little Help from another person toileting, which includes using toliet, bedpan, or urinal?: A Little Help from another  person bathing (including washing, rinsing, drying)?: A Lot Help from another person to put on and taking off regular upper body clothing?: A Little Help from another person to put on and taking off regular lower body clothing?: A Lot 6 Click Score: 16   End of Session Equipment Utilized During Treatment: Oxygen  Nurse Communication: Mobility status  Activity Tolerance: Patient limited by lethargy Patient left: in chair;with call bell/phone within reach  OT Visit Diagnosis: Other abnormalities of gait and mobility (R26.89);Muscle weakness (generalized) (M62.81)                Time: 9043-8991 OT Time Calculation (min): 12 min Charges:  OT General Charges $OT Visit: 1 Visit OT Evaluation $OT Eval Moderate Complexity: 1 Mod  Ronal Gift E. Lidia Clavijo, OTR/L Acute Rehabilitation Services [phone]   Ronal Gift Salt 05/12/2024, 3:06 PM

## 2024-05-12 NOTE — Care Management Important Message (Signed)
 Important Message  Patient Details  Name: Wayne Booth MRN: [id number] Date of Birth: 06/19/1941   Important Message Given:  Yes - Medicare IM     [name] 05/12/2024, 4:00 PM

## 2024-05-13 ENCOUNTER — Inpatient Hospital Stay (HOSPITAL_COMMUNITY)

## 2024-05-13 DIAGNOSIS — I5033 Acute on chronic diastolic (congestive) heart failure: Secondary | ICD-10-CM | POA: Diagnosis not present

## 2024-05-13 LAB — GLUCOSE, CAPILLARY: Glucose-Capillary: 113 mg/dL — ABNORMAL HIGH (ref 70–99)

## 2024-05-13 MED ORDER — OXYCODONE HCL 5 MG PO TABS
5.0000 mg | ORAL_TABLET | Freq: Once | ORAL | Status: AC
  Administered 2024-05-13: 5 mg via ORAL
  Filled 2024-05-13: qty 1

## 2024-05-13 MED ORDER — POLYETHYLENE GLYCOL 3350 17 G PO PACK
17.0000 g | PACK | Freq: Every day | ORAL | Status: DC
  Administered 2024-05-13 – 2024-05-14 (×2): 17 g via ORAL
  Filled 2024-05-13 (×2): qty 1

## 2024-05-13 MED ORDER — HYDROCODONE-ACETAMINOPHEN 5-325 MG PO TABS
1.0000 | ORAL_TABLET | Freq: Four times a day (QID) | ORAL | Status: DC | PRN
  Administered 2024-05-18: 1 via ORAL
  Filled 2024-05-13: qty 1

## 2024-05-13 MED ORDER — FUROSEMIDE 40 MG PO TABS: 40.0000 mg | ORAL_TABLET | Freq: Every day | ORAL | Status: DC

## 2024-05-13 MED ORDER — BISACODYL 10 MG RE SUPP: 10.0000 mg | Freq: Once | RECTAL | Status: DC

## 2024-05-13 MED ORDER — SODIUM CHLORIDE 0.9 % IV SOLN
12.5000 mg | Freq: Once | INTRAVENOUS | Status: AC
  Administered 2024-05-13: 12.5 mg via INTRAVENOUS
  Filled 2024-05-13: qty 0.5

## 2024-05-13 NOTE — Progress Notes (Addendum)
 PROGRESS NOTE    Wayne Booth  FMW:[id number] DOB: Aug 04, 1940 DOA: 05/11/2024 PCP: Katrinka Garnette KIDD, MD  83 y.o. male chronically ill with history of COPD, interstitial lung disease, chronic respiratory failure on 5 L home O2, diastolic CHF, CKD, PAF, CAD, RCC with nephrectomy, prostate cancer, anxiety, post-COVID pulmonary fibrosis, history of PE, OSA, chronic pain, presented to the ED with shortness of breath.  He does have some dyspnea on exertion with any activity, however yesterday he lost power for a few hours thereafter developed worsening dyspnea as he was without oxygen , called EMS, placed on O2 and brought to the ED, noted to be tachypneic, otherwise stable on 5 L O2, BNP 50, troponin 10, chest x-ray noted patchy airspace opacities in right midlung and bilateral lung bases as well as small bilateral pleural effusions. - Admitted, started on diuretics, volume status improving - 11/14, fall complained of chest wall pain, x-ray with some rib fractures  Subjective: - Breathing is fair, complains of pain at site of rib fractures  Assessment and Plan:  Acute on chronic diastolic HF - Last TTE in June 2025 showed EF EF 55%, G1DD and normal RV function - Pt presented with shortness of breath and persistent dyspnea on exertion - Patient with mild hypervolemia, has diffuse rales and opacities on CXR likely due to interstitial edema and small pleural effusions - Improving with diuresis, volume status appears better, switch to oral Lasix  tomorrow, continue, Aldactone , Jardiance  - Monitor I's/O, daily weights, BMP in a.m. -Increase activity, PT eval/OT eval completed, home health recommended  Mechanical fall-11/14 Rib fractures --Supportive care, incentive spirometry   Abnormal x-ray, patchy airspace opacity - Patient with CXR showing stable patchy airspace opacities in the right midlung and bilateral lung bases concerning for multifocal pneumonia. - X-ray with similar findings during  hospitalization for CHF exacerbation in August - Patient with notable fevers, chills or cough, low concern for pneumonia - Procalcitonin is low, suspect chronic findings, monitor without antibiotics    COPD/chronic hypoxic respiratory failure Hx of ILD/post-COVID pulmonary fibrosis - Continue home bronchodilators and PRN DuoNeb - Incentive spirometer, flutter valve - Continue supplemental O2    HTN - BP stable with SBP in the 120s to 130s - Continue spironolactone    Paroxysmal A-fib - EKG shows sinus rhythm with occasional PACs, HR stable in the 80s-100s - Not on anticoagulation or rate control medication    History of PE - Patient no longer on anticoagulation   # Anxiety # PTSD - Continue sertraline  and trazodone    # History of renal cell carcinoma - Status post nephrectomy   # History of prostate cancer - Status post seed implantation   # OSA - Not on CPAP at night   # Generalized weakness - Likely secondary to chronic respiratory problems - PT/OT eval and treat   DVT prophylaxis: lovenox  Code Status: DNR Family Communication: none present Disposition Plan: Home likely 1 to 2 days  Consultants:    Procedures:   Antimicrobials:    Objective: Vitals:   05/13/24 0345 05/13/24 0533 05/13/24 0738 05/13/24 0859  BP: 112/80  109/76   Pulse: 91  93 92  Resp: 18  18 17   Temp: 97.9 F (36.6 C)  97.9 F (36.6 C)   TempSrc: Oral  Oral   SpO2: 95%  96% 96%  Weight:  102.2 kg    Height:        Intake/Output Summary (Last 24 hours) at 05/13/2024 1023 Last data filed at 05/13/2024 0900 Gross  per 24 hour  Intake 700 ml  Output 2400 ml  Net -1700 ml   Filed Weights   05/11/24 1654 05/13/24 0533  Weight: 103 kg 102.2 kg    Examination:  General exam: Appears calm and comfortable chronically ill, AO x 3 Respiratory system: Fine bilateral rales Cardiovascular system: S1 & S2 heard, RRR.  Abd: nondistended, soft and nontender.Normal bowel sounds  heard. Central nervous system: Alert and oriented. No focal neurological deficits. Extremities: Trace edema Skin: No rashes Psychiatry:  Mood & affect appropriate.     Data Reviewed:   CBC: Recent Labs  Lab 05/11/24 1707 05/11/24 1747 05/12/24 1439  WBC 8.3  --  7.1  NEUTROABS 5.5  --   --   HGB 15.8 16.3  16.7 15.5  HCT 49.4 48.0  49.0 46.7  MCV 93.4  --  89.6  PLT 306  --  304   Basic Metabolic Panel: Recent Labs  Lab 05/11/24 1707 05/11/24 1747 05/12/24 1439  NA 139 140  140 133*  K 4.6 4.5  4.5 4.2  CL 102 101 95*  CO2 27  --  25  GLUCOSE 62* 60* 145*  BUN 17 22 19   CREATININE 1.06 1.10 1.22  CALCIUM 8.9  --  8.5*  MG  --   --  1.7   GFR: Estimated Creatinine Clearance: 59.6 mL/min (by C-G formula based on SCr of 1.22 mg/dL). Liver Function Tests: Recent Labs  Lab 05/11/24 1707  AST 41  ALT 36  ALKPHOS 68  BILITOT 0.4  PROT 8.3*  ALBUMIN  3.2*   No results for input(s): LIPASE, AMYLASE in the last 168 hours. No results for input(s): AMMONIA in the last 168 hours. Coagulation Profile: No results for input(s): INR, PROTIME in the last 168 hours. Cardiac Enzymes: No results for input(s): CKTOTAL, CKMB, CKMBINDEX, TROPONINI in the last 168 hours. BNP (last 3 results) No results for input(s): PROBNP in the last 8760 hours. HbA1C: No results for input(s): HGBA1C in the last 72 hours. CBG: Recent Labs  Lab 05/12/24 0632  GLUCAP 103*   Lipid Profile: No results for input(s): CHOL, HDL, LDLCALC, TRIG, CHOLHDL, LDLDIRECT in the last 72 hours. Thyroid  Function Tests: No results for input(s): TSH, T4TOTAL, FREET4, T3FREE, THYROIDAB in the last 72 hours. Anemia Panel: No results for input(s): VITAMINB12, FOLATE, FERRITIN, TIBC, IRON, RETICCTPCT in the last 72 hours. Urine analysis:    Component Value Date/Time   COLORURINE AMBER (A) 01/31/2024 0950   APPEARANCEUR HAZY (A) 01/31/2024 0950    LABSPEC 1.016 01/31/2024 0950   PHURINE 5.0 01/31/2024 0950   GLUCOSEU NEGATIVE 01/31/2024 0950   HGBUR LARGE (A) 01/31/2024 0950   HGBUR 2+ 09/22/2007 1444   BILIRUBINUR NEGATIVE 01/31/2024 0950   BILIRUBINUR n 11/07/2015 1417   KETONESUR NEGATIVE 01/31/2024 0950   PROTEINUR 30 (A) 01/31/2024 0950   UROBILINOGEN 1.0 11/07/2015 1417   UROBILINOGEN 1.0 05/22/2011 1454   NITRITE NEGATIVE 01/31/2024 0950   LEUKOCYTESUR NEGATIVE 01/31/2024 0950   Sepsis Labs: @LABRCNTIP (procalcitonin:4,lacticidven:4)  )No results found for this or any previous visit (from the past 240 hours).   Radiology Studies: DG Ribs Bilateral Result Date: 05/12/2024 CLINICAL DATA:  Fall earlier today rib pain chest pain EXAM: BILATERAL RIBS - 3+ VIEW COMPARISON:  Chest x-ray 05/11/2024 FINDINGS: Left rib series demonstrates possible acute left sixth anterior rib fracture. Right rib series demonstrates possible acute right fourth lateral rib fracture. Pleural effusions and basilar airspace disease. IMPRESSION: Possible acute left sixth anterior rib fracture  and right fourth lateral rib fracture. Electronically Signed   By: Luke Bun M.D.   On: 05/12/2024 20:01   DG Chest Portable 1 View Result Date: 05/11/2024 CLINICAL DATA:  Cough. EXAM: PORTABLE CHEST 1 VIEW COMPARISON:  chest x-ray 01/31/2024. Chest CT 11/28/2023. FINDINGS: There are patchy airspace opacities in the right mid lung and bilateral lung bases. There are small bilateral pleural effusions. Cardiomediastinal silhouette is enlarged unchanged. No pneumothorax or acute fracture. IMPRESSION: 1. Stable patchy airspace opacities in the right mid lung and bilateral lung bases, concerning for multifocal pneumonia. 2. Small bilateral pleural effusions. Electronically Signed   By: Greig Pique M.D.   On: 05/11/2024 17:46     Scheduled Meds:  budesonide   0.25 mg Nebulization BID   empagliflozin   10 mg Oral Daily   enoxaparin  (LOVENOX ) injection  40 mg  Subcutaneous Q24H   furosemide   40 mg Intravenous BID   [START ON 05/14/2024] furosemide   40 mg Oral Daily   sertraline   150 mg Oral Daily   spironolactone   25 mg Oral Daily   traZODone   50 mg Oral QHS   Continuous Infusions:   LOS: 1 day    Time spent:    Sigurd Pac, MD [facility]   05/13/2024, 10:23 AM

## 2024-05-14 DIAGNOSIS — I5033 Acute on chronic diastolic (congestive) heart failure: Secondary | ICD-10-CM | POA: Diagnosis not present

## 2024-05-14 LAB — BASIC METABOLIC PANEL WITH GFR
Anion gap: 11 (ref 5–15)
BUN: 20 mg/dL (ref 8–23)
CO2: 29 mmol/L (ref 22–32)
Calcium: 8.8 mg/dL — ABNORMAL LOW (ref 8.9–10.3)
Chloride: 92 mmol/L — ABNORMAL LOW (ref 98–111)
Creatinine, Ser: 1.4 mg/dL — ABNORMAL HIGH (ref 0.61–1.24)
GFR, Estimated: 50 mL/min — ABNORMAL LOW (ref >60)
Glucose, Bld: 121 mg/dL — ABNORMAL HIGH (ref 70–99)
Potassium: 3.9 mmol/L (ref 3.5–5.1)
Sodium: 132 mmol/L — ABNORMAL LOW (ref 135–145)

## 2024-05-14 MED ORDER — SENNOSIDES-DOCUSATE SODIUM 8.6-50 MG PO TABS
1.0000 | ORAL_TABLET | Freq: Two times a day (BID) | ORAL | Status: DC
  Administered 2024-05-14 – 2024-05-17 (×7): 1 via ORAL
  Filled 2024-05-14 (×8): qty 1

## 2024-05-14 MED ORDER — SIMETHICONE 80 MG PO CHEW
80.0000 mg | CHEWABLE_TABLET | Freq: Four times a day (QID) | ORAL | Status: DC
  Administered 2024-05-14 – 2024-05-18 (×16): 80 mg via ORAL
  Filled 2024-05-14 (×16): qty 1

## 2024-05-14 MED ORDER — HYDROXYZINE HCL 25 MG PO TABS
25.0000 mg | ORAL_TABLET | Freq: Three times a day (TID) | ORAL | Status: DC | PRN
  Administered 2024-05-14 – 2024-05-16 (×5): 25 mg via ORAL
  Filled 2024-05-14 (×6): qty 1

## 2024-05-14 MED ORDER — GABAPENTIN 100 MG PO CAPS
100.0000 mg | ORAL_CAPSULE | Freq: Three times a day (TID) | ORAL | Status: DC
  Administered 2024-05-14 – 2024-05-18 (×12): 100 mg via ORAL
  Filled 2024-05-14 (×12): qty 1

## 2024-05-14 MED ORDER — POLYETHYLENE GLYCOL 3350 17 G PO PACK
17.0000 g | PACK | Freq: Two times a day (BID) | ORAL | Status: DC
  Administered 2024-05-15 – 2024-05-16 (×4): 17 g via ORAL
  Filled 2024-05-14 (×7): qty 1

## 2024-05-14 MED ORDER — BISACODYL 10 MG RE SUPP: 10.0000 mg | Freq: Once | RECTAL | Status: DC

## 2024-05-14 NOTE — Progress Notes (Signed)
 [facility] Progress Note Patient: Wayne Booth FMW:[id number] DOB: 26-Jul-1940  DOA: 05/11/2024 DOS: the patient was seen and examined on 05/14/2024  Brief Hospital Course: 83 y.o. male chronically ill with history of COPD, interstitial lung disease, chronic respiratory failure on 5 L home O2, diastolic CHF, CKD, PAF, CAD, RCC with nephrectomy, prostate cancer, anxiety, post-COVID pulmonary fibrosis, history of PE, OSA, chronic pain, presented to the ED with shortness of breath.  He does have some dyspnea on exertion with any activity, however yesterday he lost power for a few hours thereafter developed worsening dyspnea as he was without oxygen , called EMS, placed on O2 and brought to the ED, noted to be tachypneic, otherwise stable on 5 L O2, BNP 50, troponin 10, chest x-ray noted patchy airspace opacities in right midlung and bilateral lung bases as well as small bilateral pleural effusions  Assessment and Plan: Acute on chronic diastolic HF Bilateral pleural effusion Last TTE in June 2025 showed EF EF 55%, G1DD and normal RV function Pt presented with shortness of breath and persistent dyspnea on exertion Patient with mild hypervolemia, has diffuse rales and opacities on CXR likely due to interstitial edema and small pleural effusions Continue Lasix  40 mg twice daily today, Aldactone , Jardiance  Monitor I's/O, daily weights, BMP in a.m. Increase activity, PT eval Repeat chest x-ray tomorrow.   Abnormal x-ray, patchy airspace opacity Patient with CXR showing stable patchy airspace opacities in the right midlung and bilateral lung bases concerning for multifocal pneumonia. X-ray with similar findings during hospitalization for CHF exacerbation in August Patient with notable fevers, chills or cough, low concern for pneumonia Procalcitonin is low, suspect chronic findings, monitor without antibiotics   COPD with progression Hx of ILD/post-COVID pulmonary fibrosis Chronic hypoxic  respiratory failure Chronically on 5 LPM. Oxygenation stable at 100% but patient feels that his breathing has not improved and has significant anxiety. Pulmonary effort appears to be significantly weak as well. Concern is that the patient is progressing with his COPD and ILD. Currently DNR. Continue current therapy.   HTN BP stable with SBP in the 120s to 130s Continue spironolactone    Paroxysmal A-fib EKG shows sinus rhythm with occasional PACs, HR stable in the 80s-100s Not on anticoagulation or rate control medication   History of PE Patient no longer on anticoagulation   Anxiety PTSD Continue sertraline  and trazodone  Vistaril  added.   History of renal cell carcinoma Status post nephrectomy   History of prostate cancer Status post seed implantation   OSA Not on CPAP at night   Generalized weakness Likely secondary to chronic respiratory problems - PT/OT eval and treat   Goals of care conversation. Patient is DNR/DNI. Had excellent discussion with the patient with regards to progression of the disease process. Will monitor for improvement.  Ileus. Concern for ileus on x-ray.  Exam also with significant distended abdomen. Recommended patient suppository but it appears that that he has refused this medication. Low threshold to lower the diet or perform a CT abdomen should the patient have more symptoms. Monitor.  Repeat x-ray tomorrow.  Subjective: Reports anxiety this morning.  Ongoing complaints of shortness of breath and fatigue.  No chest pain.  Had some nausea yesterday.  No nausea right now.  Abdomen distended.  Not passing gas.  Physical Exam: Basilar crackles. Bowel sounds present. No thrush. Abdomen tight and distended. Nontender. No edema.  Data Reviewed: I have Reviewed nursing notes, Vitals, and Lab results. Since last encounter, pertinent lab results CBC and BMP   .  I have ordered test including CBC and BMP  . I have ordered imaging x-ray  abdomen and x-ray chest  .   Disposition: Status is: Inpatient Remains inpatient appropriate because: Monitor for improvement in oxygenation  enoxaparin  (LOVENOX ) injection 40 mg Start: 05/11/24 2015  Family Communication: Family at bedside Level of care: Telemetry monitor Vitals:   05/14/24 1258 05/14/24 1259 05/14/24 1300 05/14/24 1540  BP:    (!) 124/90  Pulse: 100 95 94 99  Resp:    18  Temp:    98.4 F (36.9 C)  TempSrc:    Oral  SpO2: (!) 84% 90% 95% 100%  Weight:      Height:         Author: Yetta Blanch, MD 05/14/2024 6:32 PM  Please look on [email or web address] to find out who is on call.

## 2024-05-14 NOTE — TOC Initial Note (Addendum)
 Transition of Care [facility]) - Initial/Assessment Note    Patient Details  Name: Wayne Booth MRN: [id number] Date of Birth: [birth year]  [facility] CM/SW Contact:    Marval Gell, RN Phone Number: 05/14/2024, 9:55 AM  Clinical Narrative:                 Spoke w patient at bedside. He is agreeable to [facility] services and would like to use [facility]. He states he has home oxygen  could not identify provider. He states his wife will drive him home,I reminded him to let his wife know she needs to bring a tank for transport home   Reached out to [place], waiting for call back, entered in hub   Accepted in HUB, did not hear back from liaison    Expected Discharge Plan: Home w Home Health Services Barriers to Discharge: Continued Medical Work up   Patient Goals and CMS Choice Patient states their goals for this hospitalization and ongoing recovery are:: to go home CMS Medicare.gov Compare Post Acute Care list provided to:: Patient Choice offered to / list presented to : Patient      Expected Discharge Plan and Services   Discharge Planning Services: CM Consult Post Acute Care Choice: Home Health Living arrangements for the past 2 months: Single Family Home                           HH Arranged: PT, OT HH Agency: [facility] Date [facility] Agency Contacted: 05/14/24 Time HH Agency Contacted: [phone] Representative spoke with at [facility] Agency: waiting to hear back from [name]  Prior Living Arrangements/Services Living arrangements for the past 2 months: Single Family Home Lives with:: Spouse              Current home services: DME    Activities of Daily Living   ADL Screening (condition at time of admission) Independently performs ADLs?: Yes (appropriate for developmental age) Is the patient deaf or have difficulty hearing?: No Does the patient have difficulty seeing, even when wearing glasses/contacts?: No Does the patient have difficulty concentrating,  remembering, or making decisions?: No  Permission Sought/Granted                  Emotional Assessment              Admission diagnosis:  Shortness of breath [R06.02] SOB (shortness of breath) [R06.02] Hypoxia [R09.02] Acute on chronic congestive heart failure, unspecified heart failure type (HCC) [I50.9] Patient Active Problem List   Diagnosis Date Noted   Shortness of breath 05/11/2024   History of pulmonary fibrosis 05/11/2024   Pneumonia due to COVID-19 virus 01/31/2024   Acute renal failure superimposed on stage 2 chronic kidney disease 01/31/2024   Morbid obesity (HCC) 12/31/2023   History of CAD (coronary artery disease) 12/31/2023   Paroxysmal atrial fibrillation (HCC) 12/20/2023   History of COPD 12/20/2023   Acute on chronic diastolic CHF (congestive heart failure) (HCC) 11/29/2023   COPD (chronic obstructive pulmonary disease) (HCC) 08/29/2023   Palliative care patient 10/26/2022   Conjunctivitis 10/12/2022   Physical deconditioning 07/17/2022   Pharmacologic therapy 01/26/2021   Disorder of skeletal system 01/26/2021   Problems influencing health status 01/26/2021   ILD (interstitial lung disease) (HCC) 10/13/2019   Nodule of right lung 10/13/2019   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   PTSD (post-traumatic stress disorder) 07/27/2018   Memory loss 06/10/2018   Aortic atherosclerosis 05/10/2018  Chronic hypoxic respiratory failure (HCC) 04/26/2018   First degree AV block 04/15/2018   Hyponatremia 04/15/2018   History of pulmonary embolism 04/15/2018   Peripheral neuropathy 04/14/2018   CAD (coronary artery disease) 04/09/2018   Chronic pain syndrome 04/09/2018   CKD (chronic kidney disease), stage II    Generalized weakness 02/26/2018   Dyspnea on exertion    SOB (shortness of breath)    Constipation 01/21/2018   (HFpEF) heart failure with preserved ejection fraction (HCC) 12/16/2017   Chest pain    Pain in left knee 07/15/2017    Bilateral foot pain 07/13/2017   Status post nephrectomy 06/09/2017   VTE (venous thromboembolism) 06/09/2017   History of adenomatous polyp of colon 04/12/2017   DDD (degenerative disc disease), cervical 11/13/2015   Essential tremor 08/14/2015   Gout 08/14/2015   History of fusion of cervical spine 04/24/2015   Hypothyroidism 07/12/2014   Hyperglycemia 07/12/2014   Generalized anxiety disorder 11/07/2013   Obstructive sleep apnea 07/07/2011   Asthmatic bronchitis 06/09/2011   History of total knee replacement, 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  Dr. Renda 10/02/2008   History of renal cell carcinoma 12/26/2007   Insomnia 12/26/2007   TESTOSTERONE  DEFICIENCY 12/28/2006   Essential hypertension 12/28/2006   History of total knee replacement, left 10/28/2006   PCP:  Katrinka Garnette KIDD, MD Pharmacy:   [facility] [id number] - [address], [place] - [address] [place] [address] Phone: [phone] Fax: [phone]  [facility] [address] [place] [address] Phone: [phone] Fax: [phone]     Social Drivers of Health (SDOH) Social History: SDOH Screenings   Food Insecurity: No Food Insecurity (05/12/2024)  Housing: Low Risk  (05/12/2024)  Transportation Needs: No Transportation Needs (05/12/2024)  Utilities: Not At Risk (05/12/2024)  Alcohol  Screen: Low Risk  (01/17/2024)  Depression (PHQ2-9): Medium Risk (04/07/2024)  Financial Resource Strain: Low Risk  (01/17/2024)  Physical Activity: Sufficiently Active (01/17/2024)  Social Connections: Moderately Integrated (05/12/2024)  Stress: No Stress Concern Present (01/17/2024)  Tobacco Use: Low Risk  (05/11/2024)  Health Literacy: Adequate Health Literacy (01/17/2024)   SDOH Interventions:     Readmission Risk Interventions    12/22/2023    5:01 PM 10/20/2023   11:54 AM   Readmission Risk Prevention Plan  Transportation Screening Complete Complete  PCP or Specialist Appt within 5-7 Days  Complete  PCP or Specialist Appt within 3-5 Days Complete   Home Care Screening  Complete  Medication Review (RN CM)  Complete  HRI or Home Care Consult Complete   Palliative Care Screening Not Applicable   Medication Review (RN Care Manager) Complete

## 2024-05-15 ENCOUNTER — Inpatient Hospital Stay (HOSPITAL_COMMUNITY)

## 2024-05-15 DIAGNOSIS — R0902 Hypoxemia: Secondary | ICD-10-CM

## 2024-05-15 DIAGNOSIS — I5033 Acute on chronic diastolic (congestive) heart failure: Secondary | ICD-10-CM | POA: Diagnosis not present

## 2024-05-15 DIAGNOSIS — Z515 Encounter for palliative care: Secondary | ICD-10-CM

## 2024-05-15 DIAGNOSIS — Z7189 Other specified counseling: Secondary | ICD-10-CM

## 2024-05-15 DIAGNOSIS — I509 Heart failure, unspecified: Secondary | ICD-10-CM

## 2024-05-15 LAB — CBC
HCT: 44.6 % (ref 39.0–52.0)
Hemoglobin: 14.9 g/dL (ref 13.0–17.0)
MCH: 30.1 pg (ref 26.0–34.0)
MCHC: 33.4 g/dL (ref 30.0–36.0)
MCV: 90.1 fL (ref 80.0–100.0)
Platelets: 319 K/uL (ref 150–400)
RBC: 4.95 MIL/uL (ref 4.22–5.81)
RDW: 13.5 % (ref 11.5–15.5)
WBC: 8.5 K/uL (ref 4.0–10.5)
nRBC: 0 % (ref 0.0–0.2)

## 2024-05-15 LAB — BASIC METABOLIC PANEL WITH GFR
Anion gap: 10 (ref 5–15)
BUN: 19 mg/dL (ref 8–23)
CO2: 27 mmol/L (ref 22–32)
Calcium: 8.9 mg/dL (ref 8.9–10.3)
Chloride: 96 mmol/L — ABNORMAL LOW (ref 98–111)
Creatinine, Ser: 1.35 mg/dL — ABNORMAL HIGH (ref 0.61–1.24)
GFR, Estimated: 53 mL/min — ABNORMAL LOW (ref >60)
Glucose, Bld: 101 mg/dL — ABNORMAL HIGH (ref 70–99)
Potassium: 4.3 mmol/L (ref 3.5–5.1)
Sodium: 133 mmol/L — ABNORMAL LOW (ref 135–145)

## 2024-05-15 LAB — MRSA NEXT GEN BY PCR, NASAL: MRSA by PCR Next Gen: DETECTED — AB

## 2024-05-15 LAB — MAGNESIUM: Magnesium: 2.1 mg/dL (ref 1.7–2.4)

## 2024-05-15 NOTE — Hospital Course (Signed)
 83 y.o. male chronically ill with history of COPD, interstitial lung disease, chronic respiratory failure on 5 L home O2, diastolic CHF, CKD, PAF, CAD, RCC with nephrectomy, prostate cancer, anxiety, post-COVID pulmonary fibrosis, history of PE, OSA, chronic pain, presented to the ED with shortness of breath.  He does have some dyspnea on exertion with any activity,   Assessment and Plan: Acute on chronic diastolic HF Bilateral pleural effusion Last TTE in June 2025 showed EF EF 55%, G1DD and normal RV function Pt presented with shortness of breath and persistent dyspnea on exertion Patient with mild hypervolemia, has diffuse rales and opacities on CXR likely due to interstitial edema and small pleural effusions Treated with IV Lasix . Currently due to worsening renal function diuresis on hold. Continue Jardiance . Monitor I's/O, daily weights, Increase activity, PT eval. Repeat chest x-ray shows mild worsening of atelectasis and pleural effusion. Diuresis on hold due to worsening renal function.  Ileus.  Resolved. Concern for ileus on x-ray. Continue bowel regimen.  Goals of care conversation. Patient is DNR/DNI. Had discussion with the patient with regards to progression of the disease process. Will monitor for improvement. Patient continues to feel poorly despite being on home regimen at home oxygen . Concern is that the patient's symptom burden significantly heavy and likely nearing end-stage.  Recommend focusing on comfort if renal function does not improve to allow room for diuresis.    COPD with progression Hx of ILD/post-COVID pulmonary fibrosis Chronic hypoxic respiratory failure Chronically on 5 LPM. Patient was on room air at the time of my evaluation on 11/18 with sats at 88% on rest. Feeling better today. Concern is that the patient is progressing with his COPD and ILD. Currently DNR. Continue current therapy. Likely ready for home tomorrow.   HTN BP stable with SBP  in the 120s to 130s Continue spironolactone    Paroxysmal A-fib EKG shows sinus rhythm with occasional PACs, HR stable Not on anticoagulation or rate control medication   History of PE Patient no longer on anticoagulation   Anxiety PTSD Continue sertraline  and trazodone  Vistaril  added.   History of renal cell carcinoma Status post nephrectomy   History of prostate cancer Status post seed implantation   OSA Not on CPAP at night   Generalized weakness Likely secondary to chronic respiratory problems PT/OT eval and treat  AKI. On admission serum creatinine 1.26. Currently serum creatinine 1.46. Continue to monitor renal function.

## 2024-05-15 NOTE — Consult Note (Signed)
 Consultation Note Date: 05/15/2024   Patient Name: Wayne Booth  DOB: 01/28/1941  MRN: [id number]  Age / Sex: 83 y.o., male   PCP: Katrinka Garnette KIDD, MD Referring Physician: Tobie Yetta HERO, MD  Reason for Consultation: Establishing goals of care     Chief Complaint/History of Present Illness:   Patient is an 83 year old male with history of COPD, interstitial lung disease, chronic respiratory failure on 5 L at home, diastolic CHF, CKD, PAF, CAD, renal cell cancer status post nephrectomy, prostate cancer, anxiety, post-COVID pulmonary fibrosis, history of PE, OSA, multiple hospitalizations (8 in the last year) who presented to ED with shortness of breath.  Labs personally reviewed as well as pertinent imaging.  Notes reviewed including from hospitalist, Occupational Therapy, and case management.  I met today with patient in conjunction with his wife.  His wife reports that he has been ill and growing weaker at home.  Overall, she reports things began to worsen for them both physically and mentally whenever their youngest son died last 04-15-2024.  He also had renal cancer and both patient and wife report that they have never really been able to appropriately grieve his loss.  They both become tearful at this point and it is obvious there is a lot of trauma associated with this for them.  Multiple times throughout encounter, they needed to pause and spend some time reflecting on his passing.  We discussed the changes that he has been noticing in his nutrition, cognition, and functional status.  Initially, it was unclear if he was being followed by palliative care at home, however, his wife actually received a call from care connections nurse during our visit.  We had a conversation between myself, his palliative care nurse, patient, and his wife.  We discussed clinical course as well as wishes moving forward in regard to advanced directives.  Concepts specific to code status and  rehospitalization discussed.  We discussed difference between a aggressive medical intervention path and a palliative, comfort focused care path.  Values and goals of care important to patient and family were attempted to be elicited.  We discussed that the hospital can be useful as long as he is getting well enough from care he receives at the hospital to enjoy his time at home, but there is going to come a time in the near future where, if his goal is to be at home, he may be better served to plan on being at home and bringing care to him at home rather repeated trips to the hospital. We discussed hospice as a tool that may be beneficial in this goal when he reaches a point where we are trying to fix problems that are not fixable.  After reviewing options, he reports that he would like to transition home with home health as they have previously been arranging. He has done well with rehabbing in the past and feels he needs thi  opportunity at home. If he does well at home and continues to thrive, I encouraged they continue with this plan. If, however he is unable to regain function and he continues to decline, we have discussed plan for outpatient palliative care to follow-up to determine if he may be better served by focusing his care on staying at home with support of organization such as hospice.   Questions and concerns addressed.   PMT will continue to support holistically.   Primary Diagnoses  Present on Admission:  Shortness of breath  Acute on chronic diastolic  CHF (congestive heart failure) (HCC)  SOB (shortness of breath)  History of pulmonary embolism   Palliative Review of Systems: Dyspnea with exertion, poor energy and stamina, anxiety  Past Medical History:  Diagnosis Date   Allergy    States his nose runs when he is around grease.    Anxiety    Arthritis    Atrial fibrillation (HCC)    Cancer (HCC)    Community acquired pneumonia 06/18/2017   Coronary artery disease     Diabetes mellitus without complication (HCC)    Gout 08/14/2015   Allopurinol  150mg . Working back to this.      HCAP (healthcare-associated pneumonia) 02/03/2018   MRSA. Sepsis/septic shock/intubation as result 01/2018 hospitalization then needed CIR     History of total knee replacement    Hypertension    Hypothyroidism    Kidney cancer, primary, with metastasis from kidney to other site [facility])    Kidney stone    Lobar pneumonia 08/29/2023   OSA (obstructive sleep apnea)    pt does not wear cpap at night   Pituitary cyst    Pneumonia    hx of   Prostate cancer (HCC)    Severe sepsis (HCC) 08/29/2023   Social History   Socioeconomic History   Marital status: Married    Spouse name: Elijah   Number of children: 2   Years of education: Not on file   Highest education level: High school graduate  Occupational History   Occupation: retired Engineer, Site: RETIRED  Tobacco Use   Smoking status: Never   Smokeless tobacco: Never  Vaping Use   Vaping status: Never Used  Substance and Sexual Activity   Alcohol  use: Yes    Alcohol /week: 2.0 standard drinks of alcohol     Types: 2 Shots of liquor per week    Comment: socially   Drug use: No   Sexual activity: Yes  Other Topics Concern   Not on file  Social History Narrative   Married 1965 (wife patient of Dr. Elaine). 2 sons. 4 granddaughters.       Retired investment banker, corporate.       Hobbies: golf, fishing, formerly hunting.    Social Drivers of Corporate Investment Banker Strain: Low Risk  (01/17/2024)   Overall Financial Resource Strain (CARDIA)    Difficulty of Paying Living Expenses: Not hard at all  Food Insecurity: No Food Insecurity (05/12/2024)   Hunger Vital Sign    Worried About Running Out of Food in the Last Year: Never true    Ran Out of Food in the Last Year: Never true  Transportation Needs: No Transportation Needs (05/12/2024)   PRAPARE - Scientist, Research (physical Sciences) (Medical): No    Lack of Transportation (Non-Medical): No  Physical Activity: Sufficiently Active (01/17/2024)   Exercise Vital Sign    Days of Exercise per Week: 3 days    Minutes of Exercise per Session: 50 min  Stress: No Stress Concern Present (01/17/2024)   [employer] of Occupational Health - Occupational Stress Questionnaire    Feeling of Stress: Not at all  Social Connections: Moderately Integrated (05/12/2024)   Social Connection and Isolation Panel    Frequency of Communication with Friends and Family: More than three times a week    Frequency of Social Gatherings with Friends and Family: Three times a week    Attends Religious Services: 1 to 4 times per year    Active Member of  Clubs or Organizations: No    Attends Banker Meetings: Never    Marital Status: Married   Family History  Problem Relation Age of Onset   Cancer Mother        stomach, died when he was young   Cancer Father        [place], died before patient born   Kidney cancer Sister    Colon cancer Brother 45   Gastric cancer Son 74       stomach cancer per pt   Esophageal cancer Neg Hx    Rectal cancer Neg Hx    Stomach cancer Neg Hx    Scheduled Meds:  bisacodyl   10 mg Rectal Once   budesonide   0.25 mg Nebulization BID   empagliflozin   10 mg Oral Daily   enoxaparin  (LOVENOX ) injection  40 mg Subcutaneous Q24H   gabapentin   100 mg Oral TID   polyethylene glycol  17 g Oral BID   senna-docusate  1 tablet Oral BID   sertraline   150 mg Oral Daily   simethicone   80 mg Oral QID   traZODone   50 mg Oral QHS   Continuous Infusions: PRN Meds:.acetaminophen  **OR** acetaminophen , bisacodyl , HYDROcodone -acetaminophen , hydrOXYzine , ipratropium-albuterol , ondansetron  **OR** ondansetron  (ZOFRAN ) IV Allergies  Allergen Reactions   Ambien  [Zolpidem ] Anxiety and Other (See Comments)    Agitation    CBC:    Component Value Date/Time   WBC 8.5 05/15/2024 0301   HGB 14.9  05/15/2024 0301   HGB 16.6 07/12/2019 1509   HCT 44.6 05/15/2024 0301   HCT 49.6 07/12/2019 1509   PLT 319 05/15/2024 0301   PLT 383 07/12/2019 1509   MCV 90.1 05/15/2024 0301   MCV 91 07/12/2019 1509   NEUTROABS 5.5 05/11/2024 1707   NEUTROABS 7 05/13/2019 0000   LYMPHSABS 1.4 05/11/2024 1707   MONOABS 1.1 (H) 05/11/2024 1707   EOSABS 0.2 05/11/2024 1707   BASOSABS 0.0 05/11/2024 1707   Comprehensive Metabolic Panel:    Component Value Date/Time   NA 133 (L) 05/15/2024 0301   NA 138 07/12/2019 1509   K 4.3 05/15/2024 0301   CL 96 (L) 05/15/2024 0301   CO2 27 05/15/2024 0301   BUN 19 05/15/2024 0301   BUN 15 07/12/2019 1509   CREATININE 1.35 (H) 05/15/2024 0301   CREATININE 1.16 11/27/2016 1555   GLUCOSE 101 (H) 05/15/2024 0301   CALCIUM 8.9 05/15/2024 0301   AST 41 05/11/2024 1707   ALT 36 05/11/2024 1707   ALKPHOS 68 05/11/2024 1707   BILITOT 0.4 05/11/2024 1707   PROT 8.3 (H) 05/11/2024 1707   ALBUMIN  3.2 (L) 05/11/2024 1707    Physical Exam: Vital Signs: BP 119/62 (BP Location: Left Arm)   Pulse 91   Temp 97.9 F (36.6 C) (Oral)   Resp 20   Ht 6' 1 (1.854 m)   Wt 102.4 kg   SpO2 98%   BMI 29.78 kg/m  SpO2: SpO2: 98 % O2 Device: O2 Device: Nasal Cannula O2 Flow Rate: O2 Flow Rate (L/min): 5 L/min Intake/output summary:  Intake/Output Summary (Last 24 hours) at 05/15/2024 2111 Last data filed at 05/15/2024 2013 Gross per 24 hour  Intake 1494 ml  Output 1400 ml  Net 94 ml   LBM: Last BM Date : 05/14/24 (Smear) Baseline Weight: Weight: 103 kg Most recent weight: Weight: 102.4 kg  General: intermittently anxious and tearful Eyes: conjunctiva clear, anicteric sclera HENT: normocephalic, atraumatic, moist mucous membranes Cardiovascular: RRR, trace edema in LE b/l Respiratory:  mild increased work of breathing noted, not in respiratory distress Abdomen: not tender but distended Skin: no rashes or lesions on visible skin Neuro: A&Ox3, following  commands easily Psych: appropriately answers all questions, intermittent tearful         Additional Data Reviewed: Recent Labs    05/14/24 0305 05/15/24 0301  WBC  --  8.5  HGB  --  14.9  PLT  --  319  NA 132* 133*  BUN 20 19  CREATININE 1.40* 1.35*    Imaging: DG Abd Portable 1V EXAM: 1 VIEW XRAY OF THE ABDOMEN 05/15/2024 01:59:00 PM  COMPARISON: 05/13/2024  CLINICAL HISTORY: Ileus (HCC)  FINDINGS:  BOWEL: Interval improvement in overall gaseous distention of the colon compatible with resolving ileus.  SOFT TISSUES: Surgical clips noted in the right upper quadrant of the abdomen. Seed implants noted in the expected location of the prostate gland. No opaque urinary calculi.  BONES: Thoracolumbar degenerative disc disease. No acute osseous abnormality.  LUNGS AND PLEURAL SPACES: Persistent bilateral pleural effusions with decreased aeration of both lung bases.  IMPRESSION: 1. Interval improvement in colonic gaseous distention compatible with resolving ileus.  Electronically signed by: Waddell Calk MD 05/15/2024 02:57 PM EST RP Workstation: [id number] DG CHEST PORT 1 VIEW EXAM: 1 VIEW(S) XRAY OF THE CHEST 05/15/2024 01:59:00 PM  COMPARISON: 05/11/2024  CLINICAL HISTORY: Ileus (HCC) [id number]  FINDINGS:  LUNGS AND PLEURA: Persistent low lung volumes. Small bilateral pleural effusions are stable to improved in the interval. Persistent pulmonary vascular congestion. Decreased aeration to the lung bases which may reflect atelectasis versus airspace disease. No focal pulmonary opacity. No pneumothorax.  HEART AND MEDIASTINUM: Aortic atherosclerotic calcifications. No acute abnormality of the cardiac silhouette.  BONES AND SOFT TISSUES: No acute osseous abnormality.  IMPRESSION: 1. Persistent low lung volumes with decreased aeration at the lung bases, which may reflect atelectasis versus airspace disease. 2. Persistent pulmonary vascular  congestion. 3. Small bilateral pleural effusions, stable to improved in the interval.  Electronically signed by: Waddell Calk MD 05/15/2024 02:55 PM EST RP Workstation: [id number]    I personally reviewed recent imaging.   Palliative Care Assessment and Plan Summary of Established Goals of Care and Medical Treatment Preferences    # Complex medical decision making/goals of care  - Patient is not interested in rehab options other than home.  He is focused on returning home and having home health come to see if he can regain any functional status.    - He is followed by outpatient palliative care through care connections with [facility] [place].  Discussed with his palliative care nurse who is planning for short-term follow-up whenever he returns home to continue to see how he recovers.  We did discuss that if he is continuing to decline, he would benefit from extra support of hospice services in the home with a focus on symptom management.  -  Code Status: Limited: Do not attempt resuscitation (DNR) -DNR-LIMITED -Do Not Intubate/DNI   Prognosis: Guarded # Discharge Planning:  He desires home with home health with palliative care follow-up as an outpatient  Thank you for allowing the palliative care team to participate in the care Tanda LELON Skene.  Amaryllis Meissner, MD Palliative Care Provider PMT # [phone]  If patient remains symptomatic despite maximum doses, please call PMT at [phone] between 0700 and 1900. Outside of these hours, please call attending, as PMT does not have night coverage.   I personally spent a total of 85 minutes in the  care of the patient today including preparing to see the patient, getting/reviewing separately obtained history, performing a medically appropriate exam/evaluation, counseling and educating, referring and communicating with other health care professionals, documenting clinical information in the EHR, and coordinating care.

## 2024-05-15 NOTE — Progress Notes (Signed)
 Mobility Specialist Progress Note:    05/15/24 1201  Mobility  Activity Ambulated with assistance  Level of Assistance Contact guard assist, steadying assist  Assistive Device Front wheel walker  Distance Ambulated (ft) 50 ft  Range of Motion/Exercises Active  Activity Response Tolerated fair  Mobility Referral Yes  Mobility visit 1 Mobility  Mobility Specialist Start Time (ACUTE ONLY) 1201  Mobility Specialist Stop Time (ACUTE ONLY) 1214  Mobility Specialist Time Calculation (min) (ACUTE ONLY) 13 min   Received pt sitting in recliner eager and agreeable to session. No c/o any symptoms initially but pt needing to be bumped up to 8L during ambulation d/t SOB. Pt moving and ambulating decently but impulsive needing constant cueing to slow down. Pt requiring around 3 mins seated break on 8L to recover above 90% O2. Left pt in recliner w/ all needs met.  Venetia Keel Mobility Specialist Please Neurosurgeon or Rehab Office at [phone]

## 2024-05-15 NOTE — Progress Notes (Signed)
 Occupational Therapy Treatment Patient Details Name: Wayne Booth MRN: [id number] DOB: [birth year] Today's Date: 05/15/2024   History of present illness Wayne Booth is a 83 y.o. male who presented to [facility] ED 05/11/24 for SOB after the power went off and he was without oxygen  for 3-4 hours. EKG shows sinus tach with APCs. CXR shows stable patchy airspace opacities in the right midlung and bilateral lung bases concerning for multifocal pneumonia, small bilateral pleural effusions. PMHx: HTN, CKD 2, hypothyroidism, paroxysmal atrial fibrillation, CAD, COPD, gout, chronic diastolic CHF, prostate cancer s/p seed implantation, renal cell carcinoma s/p nephrectomy, PTSD, anxiety, post-COVID pulmonary fibrosis, ILD, essential tremor, PE, OSA, chronic pain, and chronic respiratory failure with hypoxia on 5 L.   OT comments  Pt presented up in the chair and agreeable to attempt to ambulate to sink to complete ADLs. Pt required close CGA with RW and required to be seated to complete hair care, application of deodorant and UE dressing with increase in time as needed pacing as easily fatigued. Pt then was able to ambulate back to chair with RW and CGA. Pt at this time reporting they can manage with wife assist and [facility] therapy.       If plan is discharge home, recommend the following:  A little help with walking and/or transfers;A lot of help with bathing/dressing/bathroom;Help with stairs or ramp for entrance;Assistance with cooking/housework;Assist for transportation   Equipment Recommendations  None recommended by OT    Recommendations for Other Services      Precautions / Restrictions Precautions Precautions: Fall Recall of Precautions/Restrictions: Intact Precaution/Restrictions Comments: watch RR Restrictions Weight Bearing Restrictions Per Provider Order: No       Mobility Bed Mobility               General bed mobility comments: presented in chair    Transfers Overall  transfer level: Needs assistance Equipment used: Rolling walker (2 wheels) Transfers: Sit to/from Stand Sit to Stand: Contact guard assist           General transfer comment: pt noted increase in tremors with mobility     Balance Overall balance assessment: Needs assistance Sitting-balance support: Feet supported Sitting balance-Leahy Scale: Good     Standing balance support: Bilateral upper extremity supported Standing balance-Leahy Scale: Poor Standing balance comment: Reliant on external assist for transfer                           ADL either performed or assessed with clinical judgement   ADL Overall ADL's : Needs assistance/impaired Eating/Feeding: Set up;Sitting   Grooming: Set up;Sitting   Upper Body Bathing: Contact guard assist;Sitting   Lower Body Bathing: Moderate assistance;Sit to/from stand;Maximal assistance   Upper Body Dressing : Contact guard assist;Sitting   Lower Body Dressing: Moderate assistance;Maximal assistance   Toilet Transfer: Contact guard assist;Cueing for safety;Cueing for sequencing   Toileting- Clothing Manipulation and Hygiene: Moderate assistance;Sit to/from stand       Functional mobility during ADLs: Contact guard assist;Rolling walker (2 wheels);Cueing for sequencing;Cueing for safety      Extremity/Trunk Assessment Upper Extremity Assessment Upper Extremity Assessment: Generalized weakness   Lower Extremity Assessment Lower Extremity Assessment: Defer to PT evaluation        Vision       Perception     Praxis     Communication Communication Communication: Impaired Factors Affecting Communication: Hearing impaired   Cognition Arousal: Alert Behavior During Therapy: [facility] for tasks  assessed/performed Cognition:  (Pt noted in session to not recall about power loss and unclear exactly what was going on with them)                               Following commands: Intact        Cueing    Cueing Techniques: Verbal cues  Exercises      Shoulder Instructions       General Comments 5L at 89-91%    Pertinent Vitals/ Pain       Pain Assessment Pain Assessment: No/denies pain  Home Living                                          Prior Functioning/Environment              Frequency  Min 2X/week        Progress Toward Goals  OT Goals(current goals can now be found in the care plan section)  Progress towards OT goals: Progressing toward goals  Acute Rehab OT Goals Patient Stated Goal: to get home OT Goal Formulation: With patient Time For Goal Achievement: 05/26/24 Potential to Achieve Goals: Good ADL Goals Pt Will Perform Lower Body Bathing: with contact guard assist;sitting/lateral leans;sit to/from stand;with adaptive equipment Pt Will Perform Lower Body Dressing: with contact guard assist;sitting/lateral leans;sit to/from stand;with adaptive equipment Pt Will Transfer to Toilet: regular height toilet;ambulating;with contact guard assist Pt Will Perform Toileting - Clothing Manipulation and hygiene: with contact guard assist;sitting/lateral leans;sit to/from stand Additional ADL Goal #1: Patient will be able to verbalize 3 strategies for energy conservation for safe discharge home. Additional ADL Goal #2: Patient will be able to complete functional task in standing for 5 minutes prior to seated rest break to increase overall activity tolerance.  Plan      Co-evaluation                 AM-PAC OT 6 Clicks Daily Activity     Outcome Measure   Help from another person eating meals?: A Little Help from another person taking care of personal grooming?: A Little Help from another person toileting, which includes using toliet, bedpan, or urinal?: A Little Help from another person bathing (including washing, rinsing, drying)?: A Lot Help from another person to put on and taking off regular upper body clothing?: A Little Help from  another person to put on and taking off regular lower body clothing?: A Lot 6 Click Score: 16    End of Session Equipment Utilized During Treatment: Oxygen ;Rolling walker (2 wheels)  OT Visit Diagnosis: Other abnormalities of gait and mobility (R26.89);Muscle weakness (generalized) (M62.81)   Activity Tolerance Patient limited by lethargy   Patient Left in chair;with call bell/phone within reach;with chair alarm set   Nurse Communication Mobility status        Time: 1020-1056 OT Time Calculation (min): 36 min  Charges: OT General Charges $OT Visit: 1 Visit OT Treatments $Self Care/Home Management : 23-37 mins  Warrick POUR OTR/L  Acute Rehab Services  [phone] office number   Warrick Berber 05/15/2024, 11:15 AM

## 2024-05-15 NOTE — Progress Notes (Signed)
 [facility] Progress Note Patient: Wayne Booth FMW:[id number] DOB: 20-Aug-1940  DOA: 05/11/2024 DOS: the patient was seen and examined on 05/15/2024  Brief Hospital Course: 83 y.o. male chronically ill with history of COPD, interstitial lung disease, chronic respiratory failure on 5 L home O2, diastolic CHF, CKD, PAF, CAD, RCC with nephrectomy, prostate cancer, anxiety, post-COVID pulmonary fibrosis, history of PE, OSA, chronic pain, presented to the ED with shortness of breath.  He does have some dyspnea on exertion with any activity,   Assessment and Plan: Acute on chronic diastolic HF Bilateral pleural effusion Last TTE in June 2025 showed EF EF 55%, G1DD and normal RV function Pt presented with shortness of breath and persistent dyspnea on exertion Patient with mild hypervolemia, has diffuse rales and opacities on CXR likely due to interstitial edema and small pleural effusions Treated with IV Lasix . Currently due to worsening renal function diuresis on hold. Continue Jardiance . Monitor I's/O, daily weights, Increase activity, PT eval. Repeat chest x-ray shows mild worsening of atelectasis and pleural effusion.  Ileus. Concern for ileus on x-ray. Exam also with significant distended abdomen. Recommended patient suppository but it appears that that he has refused this medication. Currently improving with current regimen. Monitor.  Goals of care conversation. Patient is DNR/DNI. Had discussion with the patient with regards to progression of the disease process. Will monitor for improvement. Patient continues to feel poorly despite being on home regimen at home oxygen . Concern is that the patient's symptom burden significantly heavy and likely nearing end-stage.  Recommend focusing on comfort if renal function does not improve to allow room for diuresis.    COPD with progression Hx of ILD/post-COVID pulmonary fibrosis Chronic hypoxic respiratory failure Chronically on  5 LPM. Oxygenation stable at 100% but patient feels that his breathing has not improved and has significant anxiety. Pulmonary effort appears to be significantly weak as well. Concern is that the patient is progressing with his COPD and ILD. Currently DNR. Continue current therapy.   HTN BP stable with SBP in the 120s to 130s Continue spironolactone    Paroxysmal A-fib EKG shows sinus rhythm with occasional PACs, HR stable Not on anticoagulation or rate control medication   History of PE Patient no longer on anticoagulation   Anxiety PTSD Continue sertraline  and trazodone  Vistaril  added.   History of renal cell carcinoma Status post nephrectomy   History of prostate cancer Status post seed implantation   OSA Not on CPAP at night   Generalized weakness Likely secondary to chronic respiratory problems PT/OT eval and treat   Subjective: Ongoing shortness of breath.  No nausea no vomiting no fever no chills.  No chest pain.  Physical Exam: General: in Mild distress, No Rash Cardiovascular: S1 and S2 Present, No Murmur Respiratory: Good respiratory effort, Bilateral Air entry present.  Basal crackles, No wheezes Abdomen: Bowel Sound present, No tenderness Extremities: Trace edema, dry skin Neuro: Alert and oriented x3, no new focal deficit   Data Reviewed: I have Reviewed nursing notes, Vitals, and Lab results. Since last encounter, pertinent lab results CBC and BMP   . I have ordered test including CBC and BMP  . I have discussed pt's care plan and test results with Palliative care   .   Disposition: Status is: Inpatient Remains inpatient appropriate because: Monitor for improvement in respiratory status  enoxaparin  (LOVENOX ) injection 40 mg Start: 05/11/24 2015   Family Communication: No one at bedside Level of care: Telemetry   Vitals:   05/15/24 0748 05/15/24  0845 05/15/24 1115 05/15/24 1528  BP: 112/66  122/67 129/76  Pulse: 83  95 91  Resp: 18  18 20    Temp:   (!) 97.5 F (36.4 C) (!) 97.4 F (36.3 C)  TempSrc:   Oral Oral  SpO2: 93% 96% 98% 99%  Weight:      Height:         Author: Yetta Blanch, MD 05/15/2024 5:19 PM  Please look on [email or web address] to find out who is on call.

## 2024-05-16 DIAGNOSIS — I5033 Acute on chronic diastolic (congestive) heart failure: Secondary | ICD-10-CM | POA: Diagnosis not present

## 2024-05-16 LAB — CBC
HCT: 44.8 % (ref 39.0–52.0)
Hemoglobin: 14.8 g/dL (ref 13.0–17.0)
MCH: 30.1 pg (ref 26.0–34.0)
MCHC: 33 g/dL (ref 30.0–36.0)
MCV: 91.1 fL (ref 80.0–100.0)
Platelets: 302 K/uL (ref 150–400)
RBC: 4.92 MIL/uL (ref 4.22–5.81)
RDW: 13.5 % (ref 11.5–15.5)
WBC: 8.9 K/uL (ref 4.0–10.5)
nRBC: 0 % (ref 0.0–0.2)

## 2024-05-16 LAB — BASIC METABOLIC PANEL WITH GFR
Anion gap: 9 (ref 5–15)
BUN: 22 mg/dL (ref 8–23)
CO2: 28 mmol/L (ref 22–32)
Calcium: 8.9 mg/dL (ref 8.9–10.3)
Chloride: 98 mmol/L (ref 98–111)
Creatinine, Ser: 1.46 mg/dL — ABNORMAL HIGH (ref 0.61–1.24)
GFR, Estimated: 48 mL/min — ABNORMAL LOW (ref >60)
Glucose, Bld: 102 mg/dL — ABNORMAL HIGH (ref 70–99)
Potassium: 4.9 mmol/L (ref 3.5–5.1)
Sodium: 135 mmol/L (ref 135–145)

## 2024-05-16 LAB — MAGNESIUM: Magnesium: 2.4 mg/dL (ref 1.7–2.4)

## 2024-05-16 MED ORDER — METHOCARBAMOL 500 MG PO TABS: 500.0000 mg | ORAL_TABLET | Freq: Once | ORAL | Status: DC

## 2024-05-16 NOTE — Progress Notes (Signed)
 Mobility Specialist Progress Note:    05/16/24 1230  Mobility  Activity Ambulated with assistance  Level of Assistance Contact guard assist, steadying assist  Assistive Device Front wheel walker  Distance Ambulated (ft) 10 ft  Range of Motion/Exercises Active  Activity Response Tolerated fair  Mobility Referral Yes  Mobility visit 1 Mobility  Mobility Specialist Start Time (ACUTE ONLY) 1230  Mobility Specialist Stop Time (ACUTE ONLY) 1305  Mobility Specialist Time Calculation (min) (ACUTE ONLY) 35 min   Received pt in recliner requesting to go to [facility]. No c/o any symptoms. Pt able to move and ambulate decently increasing ambulatory O2 to 8L to stay above 90%. Pt tolerated standing for several mins to get cleaned before returning to recliner. Left pt in recliner w/ all needs met.   Venetia Keel Mobility Specialist Please Neurosurgeon or Rehab Office at [phone]

## 2024-05-16 NOTE — Progress Notes (Signed)
 Physical Therapy Treatment Patient Details Name: Wayne Booth MRN: [id number] DOB: Sep 05, 1940 Today's Date: 05/16/2024   History of Present Illness Wayne Booth is a 83 y.o. male who presented to [facility] ED 05/11/24 for SOB after the power went off and he was without oxygen  for 3-4 hours. EKG shows sinus tach with APCs. CXR shows stable patchy airspace opacities in the right midlung and bilateral lung bases concerning for multifocal pneumonia, small bilateral pleural effusions. PMHx: HTN, CKD 2, hypothyroidism, paroxysmal atrial fibrillation, CAD, COPD, gout, chronic diastolic CHF, prostate cancer s/p seed implantation, renal cell carcinoma s/p nephrectomy, PTSD, anxiety, post-COVID pulmonary fibrosis, ILD, essential tremor, PE, OSA, chronic pain, and chronic respiratory failure with hypoxia on 5 L.    PT Comments  Pt received in recliner, call light was on and pt requesting to walk to [facility]. Pt agreeable to work with SPTA. Pt slightly impulsive to get to [facility] due to urgency. Pt lacking safety awareness with his lines, +2 for line mgmt beneficial. Pt requires verbal cues for RW safety and is eager to ambulate and mobilize, requested to walk with RN this evening. Pt was able to perform gait for short distances in room but limited due to fatigue, pt reported 7/10 on modified RPE scale. Pt RR increased during the session and SpO2 ranged from 87-92% throughout on 4L, RN notified that pt is on 5L at baseline. Pt continues to benefit from PT services to progress toward functional mobility goals.    If plan is discharge home, recommend the following: A little help with walking and/or transfers;A little help with bathing/dressing/bathroom;Assistance with cooking/housework;Assist for transportation;Help with stairs or ramp for entrance   Can travel by private vehicle        Equipment Recommendations  Wheelchair (measurements PT);Wheelchair cushion (measurements PT);Rollator (4 wheels)    Recommendations for  Other Services       Precautions / Restrictions Precautions Precautions: Fall Recall of Precautions/Restrictions: Intact Precaution/Restrictions Comments: watch RR Restrictions Weight Bearing Restrictions Per Provider Order: No     Mobility  Bed Mobility               General bed mobility comments: presented in chair    Transfers Overall transfer level: Needs assistance Equipment used: Rolling walker (2 wheels) (bariatric) Transfers: Sit to/from Stand, Bed to chair/wheelchair/BSC Sit to Stand: Min assist, +2 safety/equipment   Step pivot transfers: Min assist, +2 safety/equipment       General transfer comment: Pt slightly impulsive due to urgency to get to [facility]. Needed multimodal cues for line safety and awareness.    Ambulation/Gait Ambulation/Gait assistance: +2 safety/equipment, Min assist Gait Distance (Feet): 5 Feet (+41ft after BM/urination on BSC) Assistive device: Rolling walker (2 wheels) (bariatric) Gait Pattern/deviations: Step-to pattern, Narrow base of support, Decreased stride length Gait velocity: decreased     General Gait Details: Pt went 25ft from chair to [facility] and then an additonal 41ft around the room. Pt needed multimodal cues for line safety awareness and to keep RW closer to body. +2 for line mgmt.   Stairs             Wheelchair Mobility     Tilt Bed    Modified Rankin (Stroke Patients Only)       Balance Overall balance assessment: Needs assistance Sitting-balance support: Feet supported Sitting balance-Leahy Scale: Good Sitting balance - Comments: sitting in chair with no UE support   Standing balance support: Reliant on assistive device for balance, During functional activity, Bilateral  upper extremity supported Standing balance-Leahy Scale: Poor Standing balance comment: Reliant on RW                            Communication Communication Communication: No apparent difficulties  Cognition Arousal:  Alert Behavior During Therapy: Impulsive   PT - Cognitive impairments: Safety/Judgement, Problem solving                         Following commands: Intact      Cueing Cueing Techniques: Verbal cues, Visual cues, Tactile cues  Exercises      General Comments General comments (skin integrity, edema, etc.): Pt on 4L via [place]. SpO2 between 87-92% throughout, RN notified that pt stated he is 5L at baseline. Pt stated he was 7/10 on modified RPE scale, denies pain and dizziness. Pt requesting to walk more tonight with RN. RR increased during session.      Pertinent Vitals/Pain Pain Assessment Pain Assessment: No/denies pain    Home Living                          Prior Function            PT Goals (current goals can now be found in the care plan section) Acute Rehab PT Goals Patient Stated Goal: Return Home PT Goal Formulation: With patient Time For Goal Achievement: 05/26/24 Progress towards PT goals: Progressing toward goals    Frequency    Min 2X/week      PT Plan      Co-evaluation              AM-PAC PT 6 Clicks Mobility   Outcome Measure  Help needed turning from your back to your side while in a flat bed without using bedrails?: A Little Help needed moving from lying on your back to sitting on the side of a flat bed without using bedrails?: A Little Help needed moving to and from a bed to a chair (including a wheelchair)?: A Little Help needed standing up from a chair using your arms (e.g., wheelchair or bedside chair)?: A Little Help needed to walk in hospital room?: A Little Help needed climbing 3-5 steps with a railing? : A Lot 6 Click Score: 17    End of Session Equipment Utilized During Treatment: Oxygen ;Other (comment) (Defer gait belt due to pt urgency and impulsive movement to get to [facility].) Activity Tolerance: Patient limited by fatigue Patient left: in chair;with call bell/phone within reach;with chair alarm set;with  family/visitor present Nurse Communication: Mobility status;Other (comment) (Pt stated he is on 5L O2 at baseline, pt recieved on 4L.) PT Visit Diagnosis: Difficulty in walking, not elsewhere classified (R26.2);Other abnormalities of gait and mobility (R26.89);Unsteadiness on feet (R26.81)     Time: 8486-8472 PT Time Calculation (min) (ACUTE ONLY): 14 min  Charges:    $Gait Training: 8-22 mins PT General Charges $$ ACUTE PT VISIT: 1 Visit                    Johnnie Gaynelle JACQUE Johnnie Shaely Gadberry 05/16/2024, 4:34 PM

## 2024-05-16 NOTE — Progress Notes (Signed)
 [facility] Progress Note Patient: Wayne Booth FMW:[id number] DOB: [birth year]  DOA: 05/11/2024 DOS: the patient was seen and examined on 05/16/2024  Brief Hospital Course: 83 y.o. male chronically ill with history of COPD, interstitial lung disease, chronic respiratory failure on 5 L home O2, diastolic CHF, CKD, PAF, CAD, RCC with nephrectomy, prostate cancer, anxiety, post-COVID pulmonary fibrosis, history of PE, OSA, chronic pain, presented to the ED with shortness of breath.  He does have some dyspnea on exertion with any activity,   Assessment and Plan: Acute on chronic diastolic HF Bilateral pleural effusion Last TTE in June 2025 showed EF EF 55%, G1DD and normal RV function Pt presented with shortness of breath and persistent dyspnea on exertion Patient with mild hypervolemia, has diffuse rales and opacities on CXR likely due to interstitial edema and small pleural effusions Treated with IV Lasix . Currently due to worsening renal function diuresis on hold. Continue Jardiance . Monitor I's/O, daily weights, Increase activity, PT eval. Repeat chest x-ray shows mild worsening of atelectasis and pleural effusion. Diuresis on hold due to worsening renal function.  Ileus.  Resolved. Concern for ileus on x-ray. Continue bowel regimen.  Goals of care conversation. Patient is DNR/DNI. Had discussion with the patient with regards to progression of the disease process. Will monitor for improvement. Patient continues to feel poorly despite being on home regimen at home oxygen . Concern is that the patient's symptom burden significantly heavy and likely nearing end-stage.  Recommend focusing on comfort if renal function does not improve to allow room for diuresis.    COPD with progression Hx of ILD/post-COVID pulmonary fibrosis Chronic hypoxic respiratory failure Chronically on 5 LPM. Patient was on room air at the time of my evaluation on 11/18 with sats at 88% on  rest. Feeling better today. Concern is that the patient is progressing with his COPD and ILD. Currently DNR. Continue current therapy. Likely ready for home tomorrow.   HTN BP stable with SBP in the 120s to 130s Continue spironolactone    Paroxysmal A-fib EKG shows sinus rhythm with occasional PACs, HR stable Not on anticoagulation or rate control medication   History of PE Patient no longer on anticoagulation   Anxiety PTSD Continue sertraline  and trazodone  Vistaril  added.   History of renal cell carcinoma Status post nephrectomy   History of prostate cancer Status post seed implantation   OSA Not on CPAP at night   Generalized weakness Likely secondary to chronic respiratory problems PT/OT eval and treat  AKI. On admission serum creatinine 1.26. Currently serum creatinine 1.46. Continue to monitor renal function.   Subjective: Feeling better.  No nausea no vomiting.  No pain.  Had a bowel movement.  Physical Exam: Basal crackles. S1-S2 present Bowel sound present Nontender. Trace edema.  Data Reviewed: I have Reviewed nursing notes, Vitals, and Lab results. Since last encounter, pertinent lab results CBC and BMP   . I have ordered test including CBC and BMP  .   Disposition: Status is: Inpatient Remains inpatient appropriate because: Monitor for improvement in renal function  enoxaparin  (LOVENOX ) injection 40 mg Start: 05/11/24 2015   Family Communication: No one at bedside Level of care: Telemetry   Vitals:   05/16/24 0856 05/16/24 1120 05/16/24 1527 05/16/24 1630  BP:  116/75  137/78  Pulse:  77  81  Resp: 18 20  20   Temp:  (!) 97.4 F (36.3 C)  98.2 F (36.8 C)  TempSrc:  Oral  Oral  SpO2:  98% (!) 88% 94%  Weight:      Height:         Author: Yetta Blanch, MD 05/16/2024 7:15 PM  Please look on [email or web address] to find out who is on call.

## 2024-05-17 DIAGNOSIS — I5033 Acute on chronic diastolic (congestive) heart failure: Secondary | ICD-10-CM | POA: Diagnosis not present

## 2024-05-17 LAB — BASIC METABOLIC PANEL WITH GFR
Anion gap: 11 (ref 5–15)
BUN: 24 mg/dL — ABNORMAL HIGH (ref 8–23)
CO2: 29 mmol/L (ref 22–32)
Calcium: 8.9 mg/dL (ref 8.9–10.3)
Chloride: 95 mmol/L — ABNORMAL LOW (ref 98–111)
Creatinine, Ser: 1.39 mg/dL — ABNORMAL HIGH (ref 0.61–1.24)
GFR, Estimated: 51 mL/min — ABNORMAL LOW (ref >60)
Glucose, Bld: 136 mg/dL — ABNORMAL HIGH (ref 70–99)
Potassium: 4.5 mmol/L (ref 3.5–5.1)
Sodium: 135 mmol/L (ref 135–145)

## 2024-05-17 LAB — CBC
HCT: 43.6 % (ref 39.0–52.0)
Hemoglobin: 13.9 g/dL (ref 13.0–17.0)
MCH: 29.3 pg (ref 26.0–34.0)
MCHC: 31.9 g/dL (ref 30.0–36.0)
MCV: 91.8 fL (ref 80.0–100.0)
Platelets: 313 K/uL (ref 150–400)
RBC: 4.75 MIL/uL (ref 4.22–5.81)
RDW: 13.3 % (ref 11.5–15.5)
WBC: 7.4 K/uL (ref 4.0–10.5)
nRBC: 0 % (ref 0.0–0.2)

## 2024-05-17 LAB — MAGNESIUM: Magnesium: 2.4 mg/dL (ref 1.7–2.4)

## 2024-05-17 MED ORDER — FUROSEMIDE 40 MG PO TABS
40.0000 mg | ORAL_TABLET | Freq: Every day | ORAL | Status: DC
  Administered 2024-05-17: 40 mg via ORAL
  Filled 2024-05-17: qty 1

## 2024-05-17 MED ORDER — MUPIROCIN 2 % EX OINT
1.0000 | TOPICAL_OINTMENT | Freq: Two times a day (BID) | CUTANEOUS | Status: DC
  Administered 2024-05-17 – 2024-05-18 (×3): 1 via NASAL
  Filled 2024-05-17 (×2): qty 22

## 2024-05-17 MED ORDER — CHLORHEXIDINE GLUCONATE CLOTH 2 % EX PADS
6.0000 | MEDICATED_PAD | Freq: Every day | CUTANEOUS | Status: DC
  Administered 2024-05-17 – 2024-05-18 (×2): 6 via TOPICAL

## 2024-05-17 NOTE — Progress Notes (Signed)
 Occupational Therapy Treatment Patient Details Name: Wayne Booth MRN: [id number] DOB: 1940/09/07 Today's Date: 05/17/2024   History of present illness Wayne Booth is a 83 y.o. male who presented to [facility] ED 05/11/24 for SOB after the power went off and he was without oxygen  for 3-4 hours. EKG shows sinus tach with APCs. CXR shows stable patchy airspace opacities in the right midlung and bilateral lung bases concerning for multifocal pneumonia, small bilateral pleural effusions. PMHx: HTN, CKD 2, hypothyroidism, paroxysmal atrial fibrillation, CAD, COPD, gout, chronic diastolic CHF, prostate cancer s/p seed implantation, renal cell carcinoma s/p nephrectomy, PTSD, anxiety, post-COVID pulmonary fibrosis, ILD, essential tremor, PE, OSA, chronic pain, and chronic respiratory failure with hypoxia on 5 L.   OT comments  Pt reported not feeling as good today but wanting to work with therapy. He completed oral care post and face washing post set up and then wanted to ambulate to the bathroom with RW and close CGA and line management. He then required max-total assist for peri care post BM. At this time recommendation for max HH therapies as long as wife can assist with the return to home.       If plan is discharge home, recommend the following:  A little help with walking and/or transfers;A lot of help with bathing/dressing/bathroom;Help with stairs or ramp for entrance;Assistance with cooking/housework;Assist for transportation   Equipment Recommendations  None recommended by OT    Recommendations for Other Services      Precautions / Restrictions Precautions Precautions: Fall Recall of Precautions/Restrictions: Intact Precaution/Restrictions Comments: watch RR Restrictions Weight Bearing Restrictions Per Provider Order: No       Mobility Bed Mobility               General bed mobility comments: pt reported they are sleeping in recliner    Transfers Overall transfer level:  Needs assistance Equipment used: Rolling walker (2 wheels) Transfers: Sit to/from Stand, Bed to chair/wheelchair/BSC Sit to Stand: Contact guard assist           General transfer comment: Pt needs assist with line management and CGA to ambulate to bathroom     Balance Overall balance assessment: Needs assistance Sitting-balance support: Feet supported Sitting balance-Leahy Scale: Good     Standing balance support: Reliant on assistive device for balance, During functional activity, Bilateral upper extremity supported Standing balance-Leahy Scale: Poor Standing balance comment: Reliant on RW                           ADL either performed or assessed with clinical judgement   ADL Overall ADL's : Needs assistance/impaired Eating/Feeding: Set up;Sitting   Grooming: Set up;Sitting   Upper Body Bathing: Contact guard assist;Sitting   Lower Body Bathing: Moderate assistance;Sit to/from stand;Maximal assistance   Upper Body Dressing : Contact guard assist;Sitting   Lower Body Dressing: Moderate assistance;Maximal assistance   Toilet Transfer: Contact guard assist;Cueing for safety;Cueing for sequencing   Toileting- Clothing Manipulation and Hygiene: Maximal assistance Toileting - Clothing Manipulation Details (indicate cue type and reason): due to tolerance with standing and completion     Functional mobility during ADLs: Contact guard assist;Rolling walker (2 wheels);Cueing for sequencing;Cueing for safety      Extremity/Trunk Assessment Upper Extremity Assessment Upper Extremity Assessment: Generalized weakness   Lower Extremity Assessment Lower Extremity Assessment: Defer to PT evaluation        Vision   Vision Assessment?: No apparent visual deficits   Perception  Perception Perception: Not tested   Praxis Praxis Praxis: Not tested   Communication Communication Communication: No apparent difficulties Factors Affecting Communication: Hearing  impaired   Cognition Arousal: Alert                 OT - Cognition Comments: Pt noted some decrease in recall of prior session with this reporting therapist                 Following commands: Intact        Cueing   Cueing Techniques: Verbal cues  Exercises      Shoulder Instructions       General Comments Pt on 5L (baseline) following toileting tasks at 88% but able to recover back to 94%    Pertinent Vitals/ Pain       Pain Assessment Pain Assessment: No/denies pain  Home Living                                          Prior Functioning/Environment              Frequency  Min 2X/week        Progress Toward Goals  OT Goals(current goals can now be found in the care plan section)  Progress towards OT goals: Progressing toward goals  Acute Rehab OT Goals Patient Stated Goal: to get better OT Goal Formulation: With patient Time For Goal Achievement: 05/26/24 Potential to Achieve Goals: Good ADL Goals Pt Will Perform Lower Body Bathing: with contact guard assist;sitting/lateral leans;sit to/from stand;with adaptive equipment Pt Will Perform Lower Body Dressing: with contact guard assist;sitting/lateral leans;sit to/from stand;with adaptive equipment Pt Will Transfer to Toilet: regular height toilet;ambulating;with contact guard assist Pt Will Perform Toileting - Clothing Manipulation and hygiene: with contact guard assist;sitting/lateral leans;sit to/from stand Additional ADL Goal #1: Patient will be able to verbalize 3 strategies for energy conservation for safe discharge home. Additional ADL Goal #2: Patient will be able to complete functional task in standing for 5 minutes prior to seated rest break to increase overall activity tolerance.  Plan      Co-evaluation                 AM-PAC OT 6 Clicks Daily Activity     Outcome Measure   Help from another person eating meals?: A Little Help from another person  taking care of personal grooming?: A Little Help from another person toileting, which includes using toliet, bedpan, or urinal?: A Lot Help from another person bathing (including washing, rinsing, drying)?: A Lot Help from another person to put on and taking off regular upper body clothing?: A Little Help from another person to put on and taking off regular lower body clothing?: A Lot 6 Click Score: 15    End of Session Equipment Utilized During Treatment: Gait belt;Rolling walker (2 wheels)  OT Visit Diagnosis: Other abnormalities of gait and mobility (R26.89);Muscle weakness (generalized) (M62.81)   Activity Tolerance Patient tolerated treatment well   Patient Left in chair;with call bell/phone within reach;with chair alarm set   Nurse Communication Mobility status        Time: 9084-9051 OT Time Calculation (min): 33 min  Charges: OT General Charges $OT Visit: 1 Visit OT Treatments $Self Care/Home Management : 23-37 mins  Warrick POUR OTR/L  Acute Rehab Services  [phone] office number   Warrick Berber 05/17/2024, 9:56 AM

## 2024-05-17 NOTE — Progress Notes (Signed)
 [facility] Progress Note Patient: Wayne Booth FMW:[id number] DOB: 11-Oct-1940  DOA: 05/11/2024 DOS: the patient was seen and examined on 05/17/2024  Brief Hospital Course: 83 y.o. male chronically ill with history of COPD, interstitial lung disease, chronic respiratory failure on 5 L home O2, diastolic CHF, CKD, PAF, CAD, RCC with nephrectomy, prostate cancer, anxiety, post-COVID pulmonary fibrosis, history of PE, OSA, chronic pain, presented to the ED with shortness of breath.  He does have some dyspnea on exertion with any activity,   Assessment and Plan: Acute on chronic diastolic HF Bilateral pleural effusion Last TTE in June 2025 showed EF EF 55%, G1DD and normal RV function Pt presented with shortness of breath and persistent dyspnea on exertion Patient with mild hypervolemia, has diffuse rales and opacities on CXR likely due to interstitial edema and small pleural effusions Treated with IV Lasix . Currently due to worsening renal function diuresis on hold. Continue Jardiance . Monitor I's/O, daily weights, Increase activity, PT eval. Repeat chest x-ray shows mild worsening of atelectasis and pleural effusion. Diuresis on hold due to worsening renal function.  Will resume today and monitor renal function.  Ileus.  Resolved. Concern for ileus on x-ray. Continue bowel regimen.  Goals of care conversation. Patient is DNR/DNI. Had discussion with the patient with regards to progression of the disease process. Will monitor for improvement. Patient continues to feel poorly despite being on home regimen at home oxygen . Concern is that the patient's symptom burden significantly heavy and likely nearing end-stage.  Recommend focusing on comfort if renal function does not improve to allow room for diuresis. 11/19 tells me that he is not feeling better.  He is on room air at 92 percent.  No significant volume overload.  Informed him that his deconditioning as well as fibrosis might  have progressed significantly and expectation of further improvement will be less.  Recommended considering hospice.  He thinks he is not ready.    COPD with progression Hx of ILD/post-COVID pulmonary fibrosis Chronic hypoxic respiratory failure Chronically on 5 LPM. Patient was on room air at the time of my evaluation on 11/18 with sats at 88% on rest. Concern is that the patient is progressing with his COPD and ILD. Currently DNR. Continue current therapy.   HTN BP stable with SBP in the 120s to 130s Continue spironolactone    Paroxysmal A-fib EKG shows sinus rhythm with occasional PACs, HR stable Not on anticoagulation or rate control medication   History of PE Patient no longer on anticoagulation   Anxiety PTSD Continue sertraline  and trazodone  Vistaril  added.   History of renal cell carcinoma Status post nephrectomy   History of prostate cancer Status post seed implantation   OSA Not on CPAP at night   Generalized weakness Likely secondary to chronic respiratory problems PT/OT eval and treat  AKI. On admission serum creatinine 1.26. Currently serum creatinine 1.46. Continue to monitor renal function.  Initiate Lasix .   Subjective: Does not feel well.  Continues to have shortness of breath.  Reports fatigue and tiredness.  No chest pain.  No other complaint.  Physical Exam: General: in Mild distress, No Rash Cardiovascular: S1 and S2 Present, No Murmur Respiratory: Good respiratory effort, Bilateral Air entry present.  Faint wheezing crackles, No wheezes Abdomen: Bowel Sound present, No tenderness Extremities: Trace edema Neuro: Alert and oriented x3, no new focal deficit   Data Reviewed: I have Reviewed nursing notes, Vitals, and Lab results. Since last encounter, pertinent lab results CBC and BMP   .  I have ordered test including CBC and BMP  .   Disposition: Status is: Inpatient Remains inpatient appropriate because: Monitor for improvement in volume  status  enoxaparin  (LOVENOX ) injection 40 mg Start: 05/11/24 2015   Family Communication: No at bedside Level of care: Telemetry   Vitals:   05/17/24 0710 05/17/24 0837 05/17/24 1255 05/17/24 1535  BP: 123/81  121/64 102/81  Pulse: 66 78 75 88  Resp: 19 20 19 18   Temp: 98.1 F (36.7 C)  97.8 F (36.6 C) 98 F (36.7 C)  TempSrc: Oral  Oral Oral  SpO2: 96%  98% 91%  Weight:      Height:         Author: Yetta Blanch, MD 05/17/2024 6:18 PM  Please look on [email or web address] to find out who is on call.

## 2024-05-17 NOTE — Progress Notes (Signed)
 Daily Progress Note   Patient Name: Wayne Booth       Date: 05/17/2024 DOB: 07/20/1940  Age: 83 y.o. MRN#: [id number] Attending Physician: Tobie Yetta HERO, MD Primary Care Physician: Katrinka Garnette KIDD, MD Admit Date: 05/11/2024 Length of Stay: 5 days  Reason for Consultation/Follow-up: Establishing goals of care  Subjective:  Subjective: EMR reviewed including recent notes from therapy, care management, and primary hospitalist.  Patient sitting up in bedside chair during encounter.  He reports feeling tired and just worse overall today.  We discussed again regarding his clinical course, multiple comorbidities, and goals for his care moving forward.  He minimizes his symptoms and also minimizes the severity of his condition today.  We discussed more about the fact that he has multiple conditions that are not going to go away and overall goal is to add as much time and quality to his life as possible.  He reports that he is not ready to consider hospice and has a lot that he wants to do.  He is able to verbalize how limited he has been and his functional status, but he also mentions wanting to do things like fishing and hiking.  Discussed continuing to see how he progresses overall by monitoring his nutrition, cognition, and functional status.  Expressed concern that these continue to decline despite his desire to do more rehab and improve.  Review of Systems Reports just worse overall  Objective:   Vital Signs:  BP 112/64 (BP Location: Left Arm)   Pulse 90   Temp 98.4 F (36.9 C) (Oral)   Resp 18   Ht 6' 1 (1.854 m)   Wt 105.4 kg   SpO2 96%   BMI 30.65 kg/m   Physical Exam: General: intermittently anxious and tearful Eyes: conjunctiva clear, anicteric sclera HENT: normocephalic, atraumatic, moist mucous membranes Cardiovascular: RRR, trace edema in LE b/l Respiratory: mild increased work of breathing noted, not in respiratory distress Abdomen: not tender but  distended Skin: no rashes or lesions on visible skin Neuro: A&Ox3, following commands easily Psych: appropriately answers all questions, intermittent tearfu  Imaging: @IMAGES @  I personally reviewed recent imaging.   Assessment & Plan:   Assessment: Patient is an 83 year old male with history of COPD, interstitial lung disease, chronic respiratory failure on 5 L at home, diastolic CHF, CKD, PAF, CAD, renal cell cancer status post nephrectomy, prostate cancer, anxiety, post-COVID pulmonary fibrosis, history of PE, OSA, multiple hospitalizations (8 in the last year) who presented to ED with shortness of breath.   Recommendations/Plan: # Complex medical decision making/goals of care:  - Patient reports he is feeling worse today.  Discussed concern that his overall status continues to decline.  He reports he is not ready to consider hospice.  He is only interested in plan to transition home with home health for rehab.  - Recommend continue to be followed by outpatient palliative care through [facility].  Discussed again that if he is continuing to decline once he gets home, he would benefit from hospice services if goal is to remain home and focus on symptom management.  -  Code Status: Limited: Do not attempt resuscitation (DNR) -DNR-LIMITED -Do Not Intubate/DNI   Prognosis: < 6 months most likely and he would qualify for hospice services if desired.  Discussed with: Patient  Thank you for allowing the palliative care team to participate in the care Tanda LELON Skene.  Amaryllis Meissner, MD Palliative Care Provider PMT # [phone]  If patient remains symptomatic despite  maximum doses, please call PMT at [phone] between 0700 and 1900. Outside of these hours, please call attending, as PMT does not have night coverage.   I personally spent a total of 40 minutes in the care of the patient today including preparing to see the patient, getting/reviewing separately obtained history,  performing a medically appropriate exam/evaluation, counseling and educating, referring and communicating with other health care professionals, and documenting clinical information in the EHR.

## 2024-05-17 NOTE — Care Management Important Message (Signed)
 Important Message  Patient Details  Name: Wayne Booth MRN: [id number] Date of Birth: May 04, 1941   Important Message Given:  Yes - Medicare IM     [name] 05/17/2024, 12:50 PM

## 2024-05-18 ENCOUNTER — Other Ambulatory Visit (HOSPITAL_COMMUNITY): Payer: Self-pay

## 2024-05-18 DIAGNOSIS — I5033 Acute on chronic diastolic (congestive) heart failure: Secondary | ICD-10-CM | POA: Diagnosis not present

## 2024-05-18 LAB — BASIC METABOLIC PANEL WITH GFR
Anion gap: 9 (ref 5–15)
BUN: 20 mg/dL (ref 8–23)
CO2: 30 mmol/L (ref 22–32)
Calcium: 8.8 mg/dL — ABNORMAL LOW (ref 8.9–10.3)
Chloride: 94 mmol/L — ABNORMAL LOW (ref 98–111)
Creatinine, Ser: 1.28 mg/dL — ABNORMAL HIGH (ref 0.61–1.24)
GFR, Estimated: 56 mL/min — ABNORMAL LOW (ref >60)
Glucose, Bld: 105 mg/dL — ABNORMAL HIGH (ref 70–99)
Potassium: 4.4 mmol/L (ref 3.5–5.1)
Sodium: 133 mmol/L — ABNORMAL LOW (ref 135–145)

## 2024-05-18 LAB — CBC
HCT: 44.8 % (ref 39.0–52.0)
Hemoglobin: 14.5 g/dL (ref 13.0–17.0)
MCH: 29.7 pg (ref 26.0–34.0)
MCHC: 32.4 g/dL (ref 30.0–36.0)
MCV: 91.8 fL (ref 80.0–100.0)
Platelets: 316 K/uL (ref 150–400)
RBC: 4.88 MIL/uL (ref 4.22–5.81)
RDW: 13.4 % (ref 11.5–15.5)
WBC: 8 K/uL (ref 4.0–10.5)
nRBC: 0 % (ref 0.0–0.2)

## 2024-05-18 LAB — MAGNESIUM: Magnesium: 2.3 mg/dL (ref 1.7–2.4)

## 2024-05-18 MED ORDER — EMPAGLIFLOZIN 10 MG PO TABS
10.0000 mg | ORAL_TABLET | Freq: Every day | ORAL | 0 refills | Status: AC
  Filled 2024-05-18: qty 30, 30d supply, fill #0

## 2024-05-18 MED ORDER — HYDROCODONE-ACETAMINOPHEN 5-325 MG PO TABS
1.0000 | ORAL_TABLET | Freq: Three times a day (TID) | ORAL | 0 refills | Status: AC | PRN
  Filled 2024-05-18: qty 10, 4d supply, fill #0

## 2024-05-18 MED ORDER — DOCUSATE SODIUM 100 MG PO CAPS
100.0000 mg | ORAL_CAPSULE | Freq: Two times a day (BID) | ORAL | 0 refills | Status: DC
  Filled 2024-05-18: qty 10, 5d supply, fill #0

## 2024-05-18 MED ORDER — HYDROXYZINE HCL 25 MG PO TABS
25.0000 mg | ORAL_TABLET | Freq: Three times a day (TID) | ORAL | 0 refills | Status: AC | PRN
  Filled 2024-05-18: qty 30, 10d supply, fill #0

## 2024-05-18 MED ORDER — FUROSEMIDE 40 MG PO TABS
40.0000 mg | ORAL_TABLET | ORAL | Status: DC
  Administered 2024-05-18: 40 mg via ORAL
  Filled 2024-05-18: qty 1

## 2024-05-18 MED ORDER — SIMETHICONE 80 MG PO CHEW
80.0000 mg | CHEWABLE_TABLET | Freq: Four times a day (QID) | ORAL | 0 refills | Status: DC
  Filled 2024-05-18: qty 30, 8d supply, fill #0

## 2024-05-18 MED ORDER — GABAPENTIN 300 MG PO CAPS: 300.0000 mg | ORAL_CAPSULE | Freq: Every day | ORAL | Status: AC

## 2024-05-18 MED ORDER — FUROSEMIDE 40 MG PO TABS
40.0000 mg | ORAL_TABLET | ORAL | 0 refills | Status: AC
  Filled 2024-05-18: qty 30, 52d supply, fill #0

## 2024-05-18 NOTE — Discharge Instructions (Signed)
 Assisted Living Information if needed  Private pay; there are some facilities that accept [place] Medicaid (Im not sure which facilities those are exactly but I will provide a great resource and contact info for someone that can assist with this)  Cost: can range as low as $3,000 and up per month  Agencies that assist with ALF placment: [facility] [place] Danielle Meaux (Certified Senior Advisor (CSA)) M: [phone]  F: [phone] E: [email or web address]  [facility] - [phone]

## 2024-05-18 NOTE — TOC Progression Note (Signed)
 Transition of Care [facility]) - Progression Note    Patient Details  Name: Wayne Booth MRN: [id number] Date of Birth: 1940-09-16  Transition of Care [facility] [employer]) CM/SW Contact  Waddell Barnie Rama, RN Phone Number: 05/18/2024, 9:48 AM  Clinical Narrative:    He is set up with [facility] for [facility], wife to bring oxygen  tank at dc.    Expected Discharge Plan: Home w Home Health Services Barriers to Discharge: Continued Medical Work up               Expected Discharge Plan and Services   Discharge Planning Services: CM Consult Post Acute Care Choice: Home Health Living arrangements for the past 2 months: Single Family Home                           HH Arranged: PT, OT HH Agency: [facility] Date [facility] Agency Contacted: 05/14/24 Time HH Agency Contacted: [phone] Representative spoke with at [facility] Agency: waiting to hear back from [name]   Social Drivers of Health (SDOH) Interventions SDOH Screenings   Food Insecurity: No Food Insecurity (05/12/2024)  Housing: Low Risk  (05/12/2024)  Transportation Needs: No Transportation Needs (05/12/2024)  Utilities: Not At Risk (05/12/2024)  Alcohol  Screen: Low Risk  (01/17/2024)  Depression (PHQ2-9): Medium Risk (04/07/2024)  Financial Resource Strain: Low Risk  (01/17/2024)  Physical Activity: Sufficiently Active (01/17/2024)  Social Connections: Moderately Integrated (05/12/2024)  Stress: No Stress Concern Present (01/17/2024)  Tobacco Use: Low Risk  (05/11/2024)  Health Literacy: Adequate Health Literacy (01/17/2024)    Readmission Risk Interventions    12/22/2023    5:01 PM 10/20/2023   11:54 AM  Readmission Risk Prevention Plan  Transportation Screening Complete Complete  PCP or Specialist Appt within 5-7 Days  Complete  PCP or Specialist Appt within 3-5 Days Complete   Home Care Screening  Complete  Medication Review (RN CM)  Complete  HRI or Home Care Consult Complete   Palliative Care Screening Not Applicable    Medication Review (RN Care Manager) Complete

## 2024-05-18 NOTE — TOC Transition Note (Signed)
 Transition of Care [facility]) - Discharge Note   Patient Details  Name: Wayne Booth MRN: [id number] Date of Birth: 1941-04-16  Transition of Care [facility]) CM/SW Contact:  Waddell Barnie Rama, RN Phone Number: 05/18/2024, 10:24 AM   Clinical Narrative:    For dc today, NCM notified Angela with [facility], and notified MD for orders for HHPT, HHOT.      Barriers to Discharge: Continued Medical Work up   Patient Goals and CMS Choice Patient states their goals for this hospitalization and ongoing recovery are:: to go home CMS Medicare.gov Compare Post Acute Care list provided to:: Patient Choice offered to / list presented to : Patient      Discharge Placement                       Discharge Plan and Services Additional resources added to the After Visit Summary for     Discharge Planning Services: CM Consult Post Acute Care Choice: Home Health                    HH Arranged: PT, OT [facility] Agency: [facility] Date [facility] Agency Contacted: 05/14/24 Time HH Agency Contacted: [phone] Representative spoke with at [facility] Agency: waiting to hear back from [name]  Social Drivers of Health (SDOH) Interventions SDOH Screenings   Food Insecurity: No Food Insecurity (05/12/2024)  Housing: Low Risk  (05/12/2024)  Transportation Needs: No Transportation Needs (05/12/2024)  Utilities: Not At Risk (05/12/2024)  Alcohol  Screen: Low Risk  (01/17/2024)  Depression (PHQ2-9): Medium Risk (04/07/2024)  Financial Resource Strain: Low Risk  (01/17/2024)  Physical Activity: Sufficiently Active (01/17/2024)  Social Connections: Moderately Integrated (05/12/2024)  Stress: No Stress Concern Present (01/17/2024)  Tobacco Use: Low Risk  (05/11/2024)  Health Literacy: Adequate Health Literacy (01/17/2024)     Readmission Risk Interventions    12/22/2023    5:01 PM 10/20/2023   11:54 AM  Readmission Risk Prevention Plan  Transportation Screening Complete Complete  PCP or Specialist Appt  within 5-7 Days  Complete  PCP or Specialist Appt within 3-5 Days Complete   Home Care Screening  Complete  Medication Review (RN CM)  Complete  HRI or Home Care Consult Complete   Palliative Care Screening Not Applicable   Medication Review (RN Care Manager) Complete

## 2024-05-18 NOTE — Discharge Summary (Signed)
 Physician Discharge Summary   Patient: Wayne Booth MRN: [id number] DOB: 07-29-1940  Admit date:     05/11/2024  Discharge date: 05/18/2024  Discharge Physician: Yetta Blanch  PCP: Katrinka Garnette KIDD, MD  Recommendations at discharge: Follow-up with PCP in 1 week. Recommend outpatient palliative care. Follow-up with repeat BMP in 1 week.   Contact information for follow-up providers     Katrinka Garnette KIDD, MD. Schedule an appointment as soon as possible for a visit on 05/22/2024.   Specialty: Family Medicine Why: with BMP lab to look at kidney/electrolyte numbers, with CBC lab to look at blood counts@10 :00am Blood work Contact information: [address] [place] [address] [phone]         Katrinka Garnette KIDD, MD. Go on 05/24/2024.   Specialty: Family Medicine Why: @11 :kristy Pass information: [address] [place] [address] [phone]              Contact information for after-discharge care     Home Medical Care     [facility]) .   Service: Home Health Services Contact information: [phone] [name] [address] [facility] [place]  [phone]                    Hospital Course: 83 y.o. male chronically ill with history of COPD, interstitial lung disease, chronic respiratory failure on 5 L home O2, diastolic CHF, CKD, PAF, CAD, RCC with nephrectomy, prostate cancer, anxiety, post-COVID pulmonary fibrosis, history of PE, OSA, chronic pain, presented to the ED with shortness of breath.  He does have some dyspnea on exertion with any activity,   Assessment and Plan: Acute on chronic diastolic HF Bilateral pleural effusion Last TTE in June 2025 showed EF EF 55%, G1DD and normal RV function Pt presented with shortness of breath and persistent dyspnea on exertion Patient with mild hypervolemia, has diffuse rales and opacities on CXR likely due to interstitial edema and small pleural  effusions Treated with IV Lasix . Currently due to worsening renal function diuresis on hold. Continue Jardiance . Increase activity, PT eval. Repeat chest x-ray shows mild worsening of atelectasis and pleural effusion. Diuresis on hold due to worsening renal function.  Will resume today and monitor renal function.  Ileus.  Resolved. Concern for ileus on x-ray. Continue bowel regimen.  Goals of care conversation. Patient is DNR/DNI. Had discussion with the patient with regards to progression of the disease process. Will monitor for improvement. Patient continues to feel poorly despite being on home regimen at home oxygen . Concern is that the patient's symptom burden significantly heavy and likely nearing end-stage.  Recommend focusing on comfort if renal function does not improve to allow room for diuresis. 11/19 tells me that he is not feeling better.  He is on room air at 92 percent.  No significant volume overload.  Informed him that his deconditioning as well as fibrosis might have progressed significantly and expectation of further improvement will be less.  Recommended considering hospice.  He thinks he is not ready.    COPD with progression Hx of ILD/post-COVID pulmonary fibrosis Chronic hypoxic respiratory failure Chronically on 5 LPM. Patient was on room air at the time of my evaluation on 11/18 with sats at 88% on rest. Concern is that the patient is progressing with his COPD and ILD. Currently DNR. Continue current therapy.   HTN BP stable with SBP in the 120s to 130s Continue spironolactone    Paroxysmal A-fib EKG shows sinus rhythm  with occasional PACs, HR stable Not on anticoagulation or rate control medication   History of PE Patient no longer on anticoagulation   Anxiety PTSD Continue sertraline  and trazodone  Vistaril  added.   History of renal cell carcinoma Status post nephrectomy   History of prostate cancer Status post seed implantation    OSA Obesity Body mass index is 30.57 kg/m.  Not on CPAP at night Placing the patient at a high risk for poor outcome.   Generalized weakness Likely secondary to chronic respiratory problems PT/OT eval and treat  AKI. On admission serum creatinine 1.26. Currently serum creatinine 1.46. Continue to monitor renal function.  Initiate Lasix .  Consultants:  Palliative care  Procedures performed:  None  DISCHARGE MEDICATION: Allergies as of 05/18/2024       Reactions   Ambien  [zolpidem ] Anxiety, Other (See Comments)   Agitation         Medication List     STOP taking these medications    traMADol  50 MG tablet Commonly known as: ULTRAM        TAKE these medications    acetaminophen  325 MG tablet Commonly known as: TYLENOL  Take 2 tablets (650 mg total) by mouth every 6 (six) hours as needed for mild pain (pain score 1-3) or fever (or Fever >/= 101).   albuterol  (2.5 MG/3ML) 0.083% nebulizer solution Commonly known as: PROVENTIL  Take 3 mLs (2.5 mg total) by nebulization every 6 (six) hours as needed for wheezing or shortness of breath.   albuterol  108 (90 Base) MCG/ACT inhaler Commonly known as: VENTOLIN  HFA INHALE 2 PUFFS BY MOUTH EVERY 6 HOURS AS NEEDED FOR WHEEZING OR SHORTNESS OF BREATH   budesonide  0.25 MG/2ML nebulizer solution Commonly known as: PULMICORT  Take 2 mLs (0.25 mg total) by nebulization 2 (two) times daily.   docusate sodium  100 MG capsule Commonly known as: Colace Take 1 capsule (100 mg total) by mouth 2 (two) times daily.   furosemide  40 MG tablet Commonly known as: LASIX  Take 1 tablet (40 mg total) by mouth every Tuesday, Thursday, Saturday, and Sunday. Take additional 40 mg lasix  daily as needed for weight gain of 3 lbs in 1 day or 5 lbs in 1 week Start taking on: May 20, 2024 What changed:  medication strength when to take this additional instructions Another medication with the same name was removed. Continue taking this  medication, and follow the directions you see here.   gabapentin  300 MG capsule Commonly known as: NEURONTIN  Take 1 capsule (300 mg total) by mouth at bedtime. What changed: when to take this   HYDROcodone -acetaminophen  5-325 MG tablet Commonly known as: NORCO/VICODIN Take 1 tablet by mouth every 8 (eight) hours as needed for severe pain (pain score 7-10) or moderate pain (pain score 4-6).   hydrOXYzine  25 MG tablet Commonly known as: ATARAX  Take 1 tablet (25 mg total) by mouth 3 (three) times daily as needed for itching or anxiety.   ipratropium-albuterol  0.5-2.5 (3) MG/3ML Soln Commonly known as: DUONEB Take 3 mLs by nebulization every 6 (six) hours as needed.   Jardiance  10 MG Tabs tablet Generic drug: empagliflozin  Take 1 tablet (10 mg total) by mouth daily. Start taking on: May 19, 2024   ondansetron  4 MG tablet Commonly known as: ZOFRAN  Take 1 tablet (4 mg total) by mouth every 6 (six) hours as needed for nausea.   OXYGEN  Inhale 5 L into the lungs continuous.   sertraline  100 MG tablet Commonly known as: Zoloft  Take 1.5 tablets (150 mg total) by  mouth daily.   simethicone  80 MG chewable tablet Commonly known as: MYLICON Chew 1 tablet (80 mg total) by mouth 4 (four) times daily.   spironolactone  25 MG tablet Commonly known as: ALDACTONE  Take 1 tablet (25 mg total) by mouth daily.   traZODone  50 MG tablet Commonly known as: DESYREL  TAKE 1 TABLET BY MOUTH AT BEDTIME       Disposition: Home SNF is recommended but patient wanted to go home only. Diet recommendation: Cardiac diet  Discharge Exam: Vitals:   05/17/24 2005 05/17/24 2327 05/18/24 0320 05/18/24 0524  BP:  119/86 (!) 135/91   Pulse:  95 82   Resp:  18 20   Temp:  98.4 F (36.9 C) 97.8 F (36.6 C)   TempSrc:  Oral Axillary   SpO2: 96% 96% 97%   Weight:    105.1 kg  Height:       Faint basilar crackles. S1-S2 present Bowel sounds No edema.  Filed Weights   05/17/24 0454 05/17/24  0455 05/18/24 0524  Weight: 104.7 kg 105.4 kg 105.1 kg   Condition at discharge: stable  The results of significant diagnostics from this hospitalization (including imaging, microbiology, ancillary and laboratory) are listed below for reference.   Imaging Studies: DG Abd Portable 1V Result Date: 05/15/2024 EXAM: 1 VIEW XRAY OF THE ABDOMEN 05/15/2024 01:59:00 PM COMPARISON: 05/13/2024 CLINICAL HISTORY: Ileus (HCC) FINDINGS: BOWEL: Interval improvement in overall gaseous distention of the colon compatible with resolving ileus. SOFT TISSUES: Surgical clips noted in the right upper quadrant of the abdomen. Seed implants noted in the expected location of the prostate gland. No opaque urinary calculi. BONES: Thoracolumbar degenerative disc disease. No acute osseous abnormality. LUNGS AND PLEURAL SPACES: Persistent bilateral pleural effusions with decreased aeration of both lung bases. IMPRESSION: 1. Interval improvement in colonic gaseous distention compatible with resolving ileus. Electronically signed by: Waddell Calk MD 05/15/2024 02:57 PM EST RP Workstation: [id number]   DG CHEST PORT 1 VIEW Result Date: 05/15/2024 EXAM: 1 VIEW(S) XRAY OF THE CHEST 05/15/2024 01:59:00 PM COMPARISON: 05/11/2024 CLINICAL HISTORY: Ileus (HCC) [address] FINDINGS: LUNGS AND PLEURA: Persistent low lung volumes. Small bilateral pleural effusions are stable to improved in the interval. Persistent pulmonary vascular congestion. Decreased aeration to the lung bases which may reflect atelectasis versus airspace disease. No focal pulmonary opacity. No pneumothorax. HEART AND MEDIASTINUM: Aortic atherosclerotic calcifications. No acute abnormality of the cardiac silhouette. BONES AND SOFT TISSUES: No acute osseous abnormality. IMPRESSION: 1. Persistent low lung volumes with decreased aeration at the lung bases, which may reflect atelectasis versus airspace disease. 2. Persistent pulmonary vascular congestion. 3. Small bilateral pleural  effusions, stable to improved in the interval. Electronically signed by: Waddell Calk MD 05/15/2024 02:55 PM EST RP Workstation: [id number]   DG Abd 1 View Result Date: 05/13/2024 EXAM: 1 VIEW XRAY OF THE ABDOMEN 05/13/2024 07:19:00 PM COMPARISON: None available. CLINICAL HISTORY: Abdominal pain. FINDINGS: LINES, TUBES AND DEVICES: Brachytherapy seeds in the prostate gland. BOWEL: Scattered air and stool throughout the colon and down into the rectum without findings for obstruction. Possible mild colonic ileus. Paucity of visualized small bowel gas. SOFT TISSUES: The soft tissue shadows are grossly maintained. No findings to suggest free air. Vascular calcifications noted. BONES: The bony structures are unremarkable. LUNGS AND PLEURAL SPACES: Persistent bibasilar infiltrates and pleural effusions. IMPRESSION: 1. Possible mild colonic ileus. No bowel obstruction. 2. Persistent bibasilar infiltrates and pleural effusions. Electronically signed by: Maude Stammer MD 05/13/2024 07:35 PM EST RP Workstation: [id number]   DG Ribs  Bilateral Result Date: 05/12/2024 CLINICAL DATA:  Fall earlier today rib pain chest pain EXAM: BILATERAL RIBS - 3+ VIEW COMPARISON:  Chest x-ray 05/11/2024 FINDINGS: Left rib series demonstrates possible acute left sixth anterior rib fracture. Right rib series demonstrates possible acute right fourth lateral rib fracture. Pleural effusions and basilar airspace disease. IMPRESSION: Possible acute left sixth anterior rib fracture and right fourth lateral rib fracture. Electronically Signed   By: Luke Bun M.D.   On: 05/12/2024 20:01   DG Chest Portable 1 View Result Date: 05/11/2024 CLINICAL DATA:  Cough. EXAM: PORTABLE CHEST 1 VIEW COMPARISON:  chest x-ray 01/31/2024. Chest CT 11/28/2023. FINDINGS: There are patchy airspace opacities in the right mid lung and bilateral lung bases. There are small bilateral pleural effusions. Cardiomediastinal silhouette is enlarged unchanged. No  pneumothorax or acute fracture. IMPRESSION: 1. Stable patchy airspace opacities in the right mid lung and bilateral lung bases, concerning for multifocal pneumonia. 2. Small bilateral pleural effusions. Electronically Signed   By: Greig Pique M.D.   On: 05/11/2024 17:46    Microbiology: Results for orders placed or performed during the hospital encounter of 05/11/24  MRSA Next Gen by PCR, Nasal     Status: Abnormal   Collection Time: 05/15/24  5:20 PM   Specimen: Nasal Mucosa; Nasal Swab  Result Value Ref Range Status   MRSA by PCR Next Gen DETECTED (A) NOT DETECTED Final    Comment: RESULT CALLED TO, READ BACK BY AND VERIFIED WITH: RN DESHAWN J ON 05/15/24 @ 2105 BY DRT (NOTE) The GeneXpert MRSA Assay (FDA approved for NASAL specimens only), is one component of a comprehensive MRSA colonization surveillance program. It is not intended to diagnose MRSA infection nor to guide or monitor treatment for MRSA infections. Test performance is not FDA approved in patients less than 21 years old. Performed at [facility], [address], [place], [place] [address]    *Note: Due to a large number of results and/or encounters for the requested time period, some results have not been displayed. A complete set of results can be found in Results Review.   Labs: CBC: Recent Labs  Lab 05/11/24 1707 05/11/24 1747 05/12/24 1439 05/15/24 0301 05/16/24 0331 05/17/24 0241 05/18/24 0250  WBC 8.3  --  7.1 8.5 8.9 7.4 8.0  NEUTROABS 5.5  --   --   --   --   --   --   HGB 15.8   < > 15.5 14.9 14.8 13.9 14.5  HCT 49.4   < > 46.7 44.6 44.8 43.6 44.8  MCV 93.4  --  89.6 90.1 91.1 91.8 91.8  PLT 306  --  304 319 302 313 316   < > = values in this interval not displayed.   Basic Metabolic Panel: Recent Labs  Lab 05/12/24 1439 05/14/24 0305 05/15/24 0301 05/16/24 0331 05/17/24 0241 05/18/24 0250  NA 133* 132* 133* 135 135 133*  K 4.2 3.9 4.3 4.9 4.5 4.4  CL 95* 92* 96* 98 95* 94*   CO2 25 29 27 28 29 30   GLUCOSE 145* 121* 101* 102* 136* 105*  BUN 19 20 19 22  24* 20  CREATININE 1.22 1.40* 1.35* 1.46* 1.39* 1.28*  CALCIUM 8.5* 8.8* 8.9 8.9 8.9 8.8*  MG 1.7  --  2.1 2.4 2.4 2.3   Liver Function Tests: Recent Labs  Lab 05/11/24 1707  AST 41  ALT 36  ALKPHOS 68  BILITOT 0.4  PROT 8.3*  ALBUMIN  3.2*  CBG: Recent Labs  Lab 05/12/24 0632 05/13/24 2151  GLUCAP 103* 113*    Discharge time spent: greater than 30 minutes.  Author: Yetta Blanch, MD  [facility] 05/18/2024

## 2024-05-19 ENCOUNTER — Telehealth: Payer: Self-pay | Admitting: *Deleted

## 2024-05-19 NOTE — Transitions of Care (Post Inpatient/ED Visit) (Signed)
   05/19/2024  Name: Wayne Booth MRN: [id number] DOB: 1940-11-05  Today's TOC FU Call Status: Today's TOC FU Call Status:: Unsuccessful Call (1st Attempt) Unsuccessful Call (1st Attempt) Date: 05/19/24  Attempted to reach the patient regarding the most recent Inpatient/ED visit.  Follow Up Plan: Additional outreach attempts will be made to reach the patient to complete the Transitions of Care (Post Inpatient/ED visit) call.   Mliss Creed [facility], BSN RN Care Manager/ Transition of Care [place]/ [facility] [phone]

## 2024-05-22 ENCOUNTER — Other Ambulatory Visit

## 2024-05-22 ENCOUNTER — Telehealth: Payer: Self-pay | Admitting: *Deleted

## 2024-05-22 ENCOUNTER — Other Ambulatory Visit: Payer: Self-pay | Admitting: Adult Health

## 2024-05-22 ENCOUNTER — Telehealth: Payer: Self-pay | Admitting: Family Medicine

## 2024-05-22 NOTE — Telephone Encounter (Signed)
 Courtesy refill, pt needs an appt.

## 2024-05-22 NOTE — Telephone Encounter (Signed)
 Rx requested has refills on file, however, Rph reports that it is too early for refill. [facility] attempted to call pt to confirm Rx requested is the Rx he needs. LVM to call back.

## 2024-05-22 NOTE — Transitions of Care (Post Inpatient/ED Visit) (Signed)
   05/22/2024  Name: BRADLEE HEITMAN MRN: [id number] DOB: 1940/09/02  Today's TOC FU Call Status: Today's TOC FU Call Status:: Unsuccessful Call (2nd Attempt) Unsuccessful Call (2nd Attempt) Date: 05/22/24  Attempted to reach the patient regarding the most recent Inpatient/ED visit.  Follow Up Plan: Additional outreach attempts will be made to reach the patient to complete the Transitions of Care (Post Inpatient/ED visit) call.   Mliss Creed [facility], BSN RN Care Manager/ Transition of Care [place]/ [facility] [phone]

## 2024-05-22 NOTE — Telephone Encounter (Unsigned)
 Copied from CRM [id number]. Topic: Clinical - Medication Refill >> May 22, 2024 12:24 PM Eva FALCON wrote: Medication: sertraline  (ZOLOFT ) 100 MG tablet   Has the patient contacted their pharmacy?  No , states bottle says no more refills.  (Agent: If no, request that the patient contact the pharmacy for the refill. If patient does not wish to contact the pharmacy document the reason why and proceed with request.) (Agent: If yes, when and what did the pharmacy advise?)  This is the patient's preferred pharmacy:  [facility] [id number] [place], [place] - [address] [place] [address] Phone: [phone] Fax: [phone]   Is this the correct pharmacy for this prescription? Yes If no, delete pharmacy and type the correct one.   Has the prescription been filled recently? No.  Is the patient out of the medication? Yes  Has the patient been seen for an appointment in the last year OR does the patient have an upcoming appointment? Yes  Can we respond through MyChart? No, prefers phone.   Agent: Please be advised that Rx refills may take up to 3 business days. We ask that you follow-up with your pharmacy.

## 2024-05-22 NOTE — Telephone Encounter (Signed)
 Verbal orders okay for OT.    Copied from CRM #[id number]. Topic: Clinical - Home Health Verbal Orders >> May 22, 2024  8:44 AM Kevelyn M wrote: Caller/Agency: Michelle/[facility] Callback Number: [phone] Service Requested: Occupational Therapy Frequency: 1 week 2 2 week 3 Any new concerns about the patient? No

## 2024-05-23 ENCOUNTER — Telehealth: Payer: Self-pay | Admitting: *Deleted

## 2024-05-23 ENCOUNTER — Telehealth: Payer: Self-pay | Admitting: Family Medicine

## 2024-05-23 NOTE — Telephone Encounter (Signed)
 Ok please call pharmacy- we have recently refilled both and both have refills- what's the issue? Ok for early refill if needed - but don't want him out of these- make sure he's taking as prescribed

## 2024-05-23 NOTE — Telephone Encounter (Signed)
 Spoke with [employer] and okayed verbal orders.    Copied from CRM #[id number]. Topic: Clinical - Home Health Verbal Orders >> May 22, 2024  8:44 AM Kevelyn M wrote: Caller/Agency: Michelle/[facility] Callback Number: [phone] Service Requested: Occupational Therapy Frequency: 1 week 2 2 week 3 Any new concerns about the patient? No >> May 23, 2024 10:01 AM Frederich PARAS wrote: michelle from [facility] calling again to check the status of this, didn't see a ny new updates,  michelle would like to get this going. Callback # [phone] its secure so you can leave a vm

## 2024-05-23 NOTE — Telephone Encounter (Signed)
Please see notes and advise.  

## 2024-05-23 NOTE — Telephone Encounter (Signed)
 Need clarification from patient-we can send a letter if needed if we cannot reach him-I certainly do not want him off the medicine but perhaps lets try 1 more time and send letter-also make sure to call his wife if needed for clarification

## 2024-05-23 NOTE — Transitions of Care (Post Inpatient/ED Visit) (Signed)
   05/23/2024  Name: Wayne Booth MRN: [id number] DOB: 1941-02-27  Today's TOC FU Call Status: Today's TOC FU Call Status:: Unsuccessful Call (3rd Attempt) Unsuccessful Call (3rd Attempt) Date: 05/23/24  Attempted to reach the patient regarding the most recent Inpatient/ED visit.  Follow Up Plan: No further outreach attempts will be made at this time. We have been unable to contact the patient.  Mliss Creed [facility], BSN RN Care Manager/ Transition of Care [place]/ [facility] [phone]

## 2024-05-23 NOTE — Telephone Encounter (Signed)
 Spoke with patient and he needs a refill on the zoloft  he has none at home. He has been out of it for awhile.   Patient states he was really calling about trazadone and he's completely out and would like more.

## 2024-05-24 ENCOUNTER — Telehealth: Payer: Self-pay

## 2024-05-24 ENCOUNTER — Inpatient Hospital Stay: Admitting: Family Medicine

## 2024-05-24 NOTE — Telephone Encounter (Signed)
 Patient scheduled for appt today 05/24/2024.

## 2024-05-24 NOTE — Telephone Encounter (Signed)
 Copied from CRM #[id number]. Topic: Clinical - Home Health Verbal Orders >> May 24, 2024  2:39 PM Suzen RAMAN wrote: Caller/Agency: [facility] Callback Number: [phone] Service Requested: Physical Therapy Frequency: 2 TIMES FOR 4 WEEK AND ONE FOR 3 WEEKS Any new concerns about the patient? No  Please review HH orders and advise.

## 2024-05-29 NOTE — Telephone Encounter (Signed)
 Noted. Will alert agency.

## 2024-05-29 NOTE — Telephone Encounter (Signed)
 I'm ok with orders BUT I would not do them until after visit since they require a face to face and he has high no show rate

## 2024-05-30 ENCOUNTER — Inpatient Hospital Stay: Admitting: Family Medicine

## 2024-05-31 ENCOUNTER — Telehealth: Payer: Self-pay | Admitting: Family Medicine

## 2024-05-31 ENCOUNTER — Encounter: Payer: Self-pay | Admitting: Family Medicine

## 2024-05-31 NOTE — Telephone Encounter (Signed)
 Completed and faxed.

## 2024-05-31 NOTE — Telephone Encounter (Signed)
 [facility] faxed Home Health Certificate (Order [place] [id number]), to be filled out by provider. Patient requested to send it back via Fax within ASAP. Document is located in providers tray at front office.Please advise at [phone].

## 2024-06-02 ENCOUNTER — Telehealth: Payer: Self-pay

## 2024-06-02 NOTE — Telephone Encounter (Signed)
 Copied from CRM #[id number]. Topic: General - Other >> Jun 02, 2024 10:50 AM Rosina BIRCH wrote: Reason for CRM: kiki from [facility] called wanting to know the patient next appointment. She also stated the patient missed a few days of all his medication and may need a refill on his gabapentin  because they can not locate the pill bottle [phone]  I returned call from [facility]. I was able to leave a voicemail, I let her know of patients next appointment with PCP. Rx has been sent to patients pharmacy.

## 2024-06-05 ENCOUNTER — Inpatient Hospital Stay: Admitting: Family Medicine

## 2024-06-14 ENCOUNTER — Inpatient Hospital Stay: Admitting: Family Medicine

## 2024-06-20 ENCOUNTER — Encounter: Payer: Self-pay | Admitting: Family Medicine

## 2024-07-02 ENCOUNTER — Other Ambulatory Visit: Payer: Self-pay

## 2024-07-02 ENCOUNTER — Encounter (HOSPITAL_COMMUNITY): Payer: Self-pay

## 2024-07-02 ENCOUNTER — Emergency Department (HOSPITAL_COMMUNITY)
Admission: EM | Admit: 2024-07-02 | Discharge: 2024-07-02 | Disposition: A | Discharge status: Home / Self Care | Attending: Emergency Medicine | Admitting: Emergency Medicine

## 2024-07-02 ENCOUNTER — Emergency Department (HOSPITAL_COMMUNITY)

## 2024-07-02 DIAGNOSIS — Z7951 Long term (current) use of inhaled steroids: Secondary | ICD-10-CM | POA: Insufficient documentation

## 2024-07-02 DIAGNOSIS — Z79899 Other long term (current) drug therapy: Secondary | ICD-10-CM | POA: Insufficient documentation

## 2024-07-02 DIAGNOSIS — R000 Tachycardia, unspecified: Secondary | ICD-10-CM | POA: Diagnosis not present

## 2024-07-02 DIAGNOSIS — J188 Other pneumonia, unspecified organism: Secondary | ICD-10-CM | POA: Diagnosis not present

## 2024-07-02 DIAGNOSIS — I110 Hypertensive heart disease with heart failure: Secondary | ICD-10-CM | POA: Insufficient documentation

## 2024-07-02 DIAGNOSIS — J449 Chronic obstructive pulmonary disease, unspecified: Secondary | ICD-10-CM | POA: Insufficient documentation

## 2024-07-02 DIAGNOSIS — R059 Cough, unspecified: Secondary | ICD-10-CM | POA: Diagnosis present

## 2024-07-02 DIAGNOSIS — I509 Heart failure, unspecified: Secondary | ICD-10-CM | POA: Insufficient documentation

## 2024-07-02 DIAGNOSIS — E119 Type 2 diabetes mellitus without complications: Secondary | ICD-10-CM | POA: Insufficient documentation

## 2024-07-02 DIAGNOSIS — Z7984 Long term (current) use of oral hypoglycemic drugs: Secondary | ICD-10-CM | POA: Diagnosis not present

## 2024-07-02 LAB — BASIC METABOLIC PANEL WITH GFR
Anion gap: 8 (ref 5–15)
BUN: 9 mg/dL (ref 8–23)
CO2: 30 mmol/L (ref 22–32)
Calcium: 8.6 mg/dL — ABNORMAL LOW (ref 8.9–10.3)
Chloride: 100 mmol/L (ref 98–111)
Creatinine, Ser: 0.9 mg/dL (ref 0.61–1.24)
GFR, Estimated: >60 mL/min
Glucose, Bld: 118 mg/dL — ABNORMAL HIGH (ref 70–99)
Potassium: 3.7 mmol/L (ref 3.5–5.1)
Sodium: 138 mmol/L (ref 135–145)

## 2024-07-02 LAB — CBC
HCT: 42.2 % (ref 39.0–52.0)
Hemoglobin: 13.6 g/dL (ref 13.0–17.0)
MCH: 29.4 pg (ref 26.0–34.0)
MCHC: 32.2 g/dL (ref 30.0–36.0)
MCV: 91.1 fL (ref 80.0–100.0)
Platelets: 320 K/uL (ref 150–400)
RBC: 4.63 MIL/uL (ref 4.22–5.81)
RDW: 13.6 % (ref 11.5–15.5)
WBC: 9 K/uL (ref 4.0–10.5)
nRBC: 0 % (ref 0.0–0.2)

## 2024-07-02 LAB — RESP PANEL BY RT-PCR (RSV, FLU A&B, COVID)  RVPGX2
Influenza A by PCR: NEGATIVE
Influenza B by PCR: NEGATIVE
Resp Syncytial Virus by PCR: NEGATIVE
SARS Coronavirus 2 by RT PCR: NEGATIVE

## 2024-07-02 LAB — PRO BRAIN NATRIURETIC PEPTIDE: Pro Brain Natriuretic Peptide: 251 pg/mL

## 2024-07-02 MED ORDER — AMOXICILLIN-POT CLAVULANATE 875-125 MG PO TABS: 1.0000 | ORAL_TABLET | Freq: Two times a day (BID) | ORAL | 0 refills | Status: AC

## 2024-07-02 MED ORDER — IPRATROPIUM-ALBUTEROL 0.5-2.5 (3) MG/3ML IN SOLN
3.0000 mL | Freq: Once | RESPIRATORY_TRACT | Status: AC
  Administered 2024-07-02: 3 mL via RESPIRATORY_TRACT
  Filled 2024-07-02: qty 3

## 2024-07-02 MED ORDER — SODIUM CHLORIDE 0.9 % IV SOLN
500.0000 mg | Freq: Once | INTRAVENOUS | Status: AC
  Administered 2024-07-02: 500 mg via INTRAVENOUS
  Filled 2024-07-02: qty 5

## 2024-07-02 MED ORDER — SODIUM CHLORIDE 0.9 % IV SOLN
2.0000 g | Freq: Once | INTRAVENOUS | Status: AC
  Administered 2024-07-02: 2 g via INTRAVENOUS
  Filled 2024-07-02: qty 20

## 2024-07-02 MED ORDER — SODIUM CHLORIDE 0.9 % IV SOLN: 1.0000 g | Freq: Once | INTRAVENOUS | Status: DC

## 2024-07-02 MED ORDER — GUAIFENESIN 200 MG PO TABS
200.0000 mg | ORAL_TABLET | Freq: Once | ORAL | Status: AC
  Administered 2024-07-02: 200 mg via ORAL
  Filled 2024-07-02: qty 1

## 2024-07-02 MED ORDER — AZITHROMYCIN 250 MG PO TABS: 250.0000 mg | ORAL_TABLET | Freq: Every day | ORAL | 0 refills | Status: AC

## 2024-07-02 MED ORDER — IPRATROPIUM-ALBUTEROL 0.5-2.5 (3) MG/3ML IN SOLN
RESPIRATORY_TRACT | Status: AC
  Filled 2024-07-02: qty 3

## 2024-07-02 MED ORDER — FUROSEMIDE 10 MG/ML IJ SOLN
40.0000 mg | Freq: Once | INTRAMUSCULAR | Status: AC
  Administered 2024-07-02: 40 mg via INTRAVENOUS
  Filled 2024-07-02: qty 4

## 2024-07-02 NOTE — Discharge Instructions (Signed)
 You were seen in the emergency department for your shortness of breath and your cough.  Your x-ray showed that you have pneumonia.  We have given you a course of antibiotics and you should complete this as prescribed.  You can continue to use your inhalers as needed at home.  You should follow-up with your primary doctor in the next few days to have your symptoms rechecked.  You should return to the emergency department for fevers despite the antibiotics, significantly worsening shortness of breath, or any other concerning symptoms.

## 2024-07-02 NOTE — ED Triage Notes (Signed)
 Patient BIB [facility] from home. Has COPD. Woke up today short of breath. Has had a cough for a few days. Tried albuterol  at home, no relief. 92% 5L nasal cannula at baseline.  EMS 2 duonebs 20G left hand 125mg  solumedrol

## 2024-07-02 NOTE — ED Notes (Signed)
 Pt placed on his baseline 5L/min O2 via [place].

## 2024-07-02 NOTE — ED Provider Notes (Signed)
 " [place] EMERGENCY DEPARTMENT AT [facility] Provider Note   CSN: [id number] Arrival date & time: 07/02/24  1147     Patient presents with: Shortness of Breath   Wayne Booth is a 84 y.o. male.   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  The patient states he has had worsening shortness of breath for the last few days.  He states that he feels short of breath all the time.  He states that he has had a mild cough productive of white sputum.  He denies any fever or lower extremity swelling.  He denies any associated chest pain.  He states he has been using his inhalers at home without significant relief.  Did receive Solu-Medrol  and DuoNebs by EMS with improvement of symptoms.  The history is provided by the patient.  Shortness of Breath      Prior to Admission medications  Medication Sig Start Date End Date Taking? Authorizing Provider  acetaminophen  (TYLENOL ) 325 MG tablet Take 2 tablets (650 mg total) by mouth every 6 (six) hours as needed for mild pain (pain score 1-3) or fever (or Fever >/= 101). 12/24/23  Yes Arrien, Elidia Sieving, MD  albuterol  (PROVENTIL ) (2.5 MG/3ML) 0.083% nebulizer solution Take 3 mLs (2.5 mg total) by nebulization every 6 (six) hours as needed for wheezing or shortness of breath. 10/12/22  Yes Parrett, Tammy S, NP  albuterol  (VENTOLIN  HFA) 108 (90 Base) MCG/ACT inhaler INHALE 2 PUFFS BY MOUTH EVERY 6 HOURS AS NEEDED FOR WHEEZING OR SHORTNESS OF BREATH 05/22/24  Yes Parrett, Tammy S, NP  amoxicillin -clavulanate (AUGMENTIN ) 875-125 MG tablet Take 1 tablet by mouth every 12 (twelve) hours for 5 days. 07/02/24 07/07/24 Yes Kingsley, Rayjon Wery K, DO  azithromycin  (ZITHROMAX ) 250 MG tablet Take 1 tablet (250 mg total) by mouth daily. Take 1 every day until finished. 07/02/24  Yes Ellouise, Obed Samek K, DO  budesonide   (PULMICORT ) 0.25 MG/2ML nebulizer solution Take 2 mLs (0.25 mg total) by nebulization 2 (two) times daily. 09/07/23  Yes Jillian Buttery, MD  empagliflozin  (JARDIANCE ) 10 MG TABS tablet Take 1 tablet (10 mg total) by mouth daily. 05/19/24  Yes Tobie Yetta HERO, MD  furosemide  (LASIX ) 40 MG tablet Take 1 tablet (40 mg total) by mouth every Tuesday, Thursday, Saturday, and Sunday. Take additional 40 mg lasix  daily as needed for weight gain of 3 lbs in 1 day or 5 lbs in 1 week 05/20/24  Yes Tobie Yetta HERO, MD  gabapentin  (NEURONTIN ) 300 MG capsule Take 1 capsule (300 mg total) by mouth at bedtime. 05/18/24  Yes Tobie Yetta HERO, MD  HYDROcodone -acetaminophen  (NORCO/VICODIN) 5-325 MG tablet Take 1 tablet by mouth every 8 (eight) hours as needed for severe pain (pain score 7-10) or moderate pain (pain score 4-6). 05/18/24  Yes Tobie Yetta HERO, MD  hydrOXYzine  (ATARAX ) 25 MG tablet Take 1 tablet (25 mg total) by mouth 3 (three) times daily as needed for itching or anxiety. 05/18/24  Yes Tobie Yetta HERO, MD  ipratropium-albuterol  (DUONEB) 0.5-2.5 (3) MG/3ML SOLN Take 3 mLs by nebulization every 6 (six) hours as needed. 09/07/23  Yes Jillian Buttery, MD  ondansetron  (ZOFRAN ) 4 MG tablet Take 1 tablet (4 mg total) by mouth every 6 (six) hours as needed for nausea. 09/17/23  Yes Will Almarie MATSU, MD  OXYGEN  Inhale 5 L into the lungs  continuous.   Yes [provider]  sertraline  (ZOLOFT ) 100 MG tablet Take 1.5 tablets (150 mg total) by mouth daily. 04/07/24  Yes Katrinka Garnette KIDD, MD  spironolactone  (ALDACTONE ) 25 MG tablet Take 1 tablet (25 mg total) by mouth daily. 12/25/23 01/30/25 Yes Arrien, Elidia Sieving, MD  traZODone  (DESYREL ) 50 MG tablet TAKE 1 TABLET BY MOUTH AT BEDTIME 05/02/24  Yes Katrinka Garnette KIDD, MD    Allergies: Ambien  [zolpidem ]    Review of Systems  Respiratory:  Positive for shortness of breath.     Updated Vital Signs BP (!) 129/103   Pulse (!) 109   Temp 97.8 F (36.6 C)    Resp 20   Ht 6' 1 (1.854 m)   Wt 105 kg   SpO2 98%   BMI 30.54 kg/m   Physical Exam Vitals and nursing note reviewed.  Constitutional:      General: He is not in acute distress.    Appearance: He is well-developed. He is obese. He is ill-appearing (chronically).  HENT:     Head: Normocephalic.     Mouth/Throat:     Mouth: Mucous membranes are moist.     Pharynx: Oropharynx is clear.  Eyes:     Extraocular Movements: Extraocular movements intact.  Cardiovascular:     Rate and Rhythm: Regular rhythm. Tachycardia present.  Pulmonary:     Effort: Tachypnea present. No accessory muscle usage.     Breath sounds: Examination of the right-lower field reveals rales. Rales present.  Abdominal:     Palpations: Abdomen is soft.     Tenderness: There is no abdominal tenderness.  Musculoskeletal:        General: Normal range of motion.     Cervical back: Normal range of motion and neck supple.     Right lower leg: No edema.     Left lower leg: No edema.  Skin:    General: Skin is warm and dry.  Neurological:     General: No focal deficit present.     Mental Status: He is alert and oriented to person, place, and time.  Psychiatric:        Mood and Affect: Mood normal.        Behavior: Behavior normal.     (all labs ordered are listed, but only abnormal results are displayed) Labs Reviewed  BASIC METABOLIC PANEL WITH GFR - Abnormal; Notable for the following components:      Result Value   Glucose, Bld 118 (*)    Calcium 8.6 (*)    All other components within normal limits  RESP PANEL BY RT-PCR (RSV, FLU A&B, COVID)  RVPGX2  CBC  PRO BRAIN NATRIURETIC PEPTIDE    EKG: None  Radiology: DG Chest 2 View Result Date: 07/02/2024 CLINICAL DATA:  short of breath EXAM: CHEST - 2 VIEW COMPARISON:  May 15, 2024 FINDINGS: Limited assessment on lateral radiograph secondary to underpenetration. The cardiomediastinal silhouette is unchanged and enlarged in contour.Atherosclerotic  calcifications. Bilateral pleural effusions, increased from prior. No pneumothorax. Bibasilar predominant heterogeneous opacities with peribronchial cuffing and vascular indistinctness. Visualized abdomen is unremarkable. Multilevel degenerative changes of the thoracic spine. IMPRESSION: Constellation of findings are favored to reflect pulmonary edema with bilateral pleural effusions. Differential considerations include atypical infection. Electronically Signed   By: Corean Salter M.D.   On: 07/02/2024 13:00     Procedures   Medications Ordered in the ED  azithromycin  (ZITHROMAX ) 500 mg in sodium chloride  0.9 % 250 mL IVPB (500 mg  Intravenous New Bag/Given 07/02/24 1853)  ipratropium-albuterol  (DUONEB) 0.5-2.5 (3) MG/3ML nebulizer solution (  Not Given 07/02/24 1855)  furosemide  (LASIX ) injection 40 mg (40 mg Intravenous Given 07/02/24 1354)  guaiFENesin  tablet 200 mg (200 mg Oral Given 07/02/24 1603)  ipratropium-albuterol  (DUONEB) 0.5-2.5 (3) MG/3ML nebulizer solution 3 mL (3 mLs Nebulization Given 07/02/24 1819)  cefTRIAXone  (ROCEPHIN ) 2 g in sodium chloride  0.9 % 100 mL IVPB (0 g Intravenous Stopped 07/02/24 1848)    Clinical Course as of 07/02/24 1940  Sun Jul 02, 2024  1937 Patient reports improvement of symptoms after duoneb. He is stable for discharge home with outpatient follow up. [VK]    Clinical Course User Index [VK] Kingsley, Amariyah Bazar K, DO                                 Medical Decision Making This patient presents to the ED with chief complaint(s) of SOB with pertinent past medical history of COPD and ILD on 5L home O2, CHF, DM, HTN which further complicates the presenting complaint. The complaint involves an extensive differential diagnosis and also carries with it a high risk of complications and morbidity.    The differential diagnosis includes patient has no significant wheezing on exam making COPD exacerbation less likely, considering arrhythmia, anemia, ACS, pneumonia,  pneumothorax, pulmonary edema, pleural effusion, viral syndrome  Additional history obtained: Additional history obtained from EMS  Records reviewed previous admission documents  ED Course and Reassessment: On patient's arrival he is mildly tachypneic but otherwise hemodynamically stable in no acute distress on his home O2.  He had labs and x-ray initiated in triage.  Patient's labs are within normal range, chest x-ray did show right sided pleural effusion with likely pulmonary edema versus atypical infection.  The patient will have BNP added on and started on Lasix  and will be closely reassessed.  Independent labs interpretation:  The following labs were independently interpreted: within normal range  Independent visualization of imaging: - I independently visualized the following imaging with scope of interpretation limited to determining acute life threatening conditions related to emergency care: CXR, which revealed pulmonary edema vs atypical pneumonia; symptoms more consistent with pneumonia  Consultation: - Consulted or discussed management/test interpretation w/ external professional: N/A  Consideration for admission or further workup: Patient has no emergent conditions requiring admission or further work-up at this time and is stable for discharge home with primary care follow-up  Social Determinants of health: N/A    Amount and/or Complexity of Data Reviewed Labs: ordered. Radiology: ordered.  Risk OTC drugs. Prescription drug management.       Final diagnoses:  Multifocal pneumonia    ED Discharge Orders          Ordered    amoxicillin -clavulanate (AUGMENTIN ) 875-125 MG tablet  Every 12 hours        07/02/24 1939    azithromycin  (ZITHROMAX ) 250 MG tablet  Daily        07/02/24 1939               Kingsley, Macon Sandiford K, DO 07/02/24 1940  "

## 2024-07-04 ENCOUNTER — Other Ambulatory Visit: Payer: Self-pay | Admitting: Adult Health

## 2024-07-04 ENCOUNTER — Ambulatory Visit

## 2024-07-04 ENCOUNTER — Telehealth: Payer: Self-pay | Admitting: Family Medicine

## 2024-07-04 NOTE — Telephone Encounter (Signed)
 Please see patient message and advise.   Copied from CRM [id number]. Topic: General - Other >> Jul 04, 2024  4:08 PM Wayne Booth wrote: Lija a PT with [facility] calling to informed the provider the pt has been diagnosed with [place] in the ER yesterday and when she visited the pts home today pt was complaining of shortness of breath BP was 120/70 pulse was 88 oxy 90, Pt was not under any destress but he took two puff of the pro air and is due for addinional dose of antibiotics within a few house. Pt was told if he feels worst to call 911 and go to the ER.   PT would like to extend one more visit for next week to then determine discharge. Verbal orders 1 week 1 for next week call back [phone] (Lija)

## 2024-07-04 NOTE — Telephone Encounter (Signed)
 Agree with seeking care if worsening issues  With that being said I cannot provide face-to-face visit as he has not been seen in over 3 months-can only provide verbal from our office if there is no face-to-face required based on prior visits

## 2024-07-06 ENCOUNTER — Telehealth: Payer: Self-pay

## 2024-07-06 NOTE — Telephone Encounter (Signed)
 Transition Care Management Follow-up Telephone Call Date of discharge and from where: 07/02/24 [facility] How have you been since you were released from the hospital? [facility] Any questions or concerns? No  Items Reviewed: Did the pt receive and understand the discharge instructions provided? Yes  Medications obtained and verified? Yes  Other? No  Any new allergies since your discharge? No  Dietary orders reviewed? No Do you have support at home? Yes   Home Care and Equipment/Supplies: Were home health services ordered? not applicable If so, what is the name of the agency?  Has the agency set up a time to come to the patient's home? not applicable Were any new equipment or medical supplies ordered?  No What is the name of the medical supply agency?  Were you able to get the supplies/equipment? not applicable Do you have any questions related to the use of the equipment or supplies? No  Functional Questionnaire: (I = Independent and D = Dependent) ADLs: I  Bathing/Dressing- I  Meal Prep- I  Eating- I  Maintaining continence- I  Transferring/Ambulation- I  Managing Meds- I  Follow up appointments reviewed:  PCP Hospital f/u appt confirmed? Yes  Scheduled to see Wayne Booth on 07/12/24 @ 9am. Specialist [facility] f/u appt confirmed? NA  Are transportation arrangements needed? No  If their condition worsens, is the pt aware to call PCP or go to the Emergency Dept.? Yes Was the patient provided with contact information for the PCP's office or ED? Yes Was to pt encouraged to call back with questions or concerns? Yes

## 2024-07-07 ENCOUNTER — Ambulatory Visit: Admitting: Family Medicine

## 2024-07-12 ENCOUNTER — Ambulatory Visit

## 2024-07-12 ENCOUNTER — Other Ambulatory Visit: Payer: Self-pay | Admitting: Adult Health

## 2024-07-12 ENCOUNTER — Ambulatory Visit: Admitting: Family Medicine

## 2024-07-13 ENCOUNTER — Other Ambulatory Visit (HOSPITAL_BASED_OUTPATIENT_CLINIC_OR_DEPARTMENT_OTHER): Payer: Self-pay | Admitting: Pulmonary Disease

## 2024-07-13 MED ORDER — ALBUTEROL SULFATE HFA 108 (90 BASE) MCG/ACT IN AERS: 2.0000 | INHALATION_SPRAY | Freq: Four times a day (QID) | RESPIRATORY_TRACT | 0 refills | Status: AC | PRN

## 2024-07-13 NOTE — Telephone Encounter (Signed)
 Copied from CRM [id number]. Topic: Clinical - Medication Refill >> Jul 13, 2024 10:10 AM Wayne Booth wrote: Medication: albuterol  (VENTOLIN  HFA) 108 (90 Base) MCG/ACT inhaler  Has the patient contacted their pharmacy? Yes (Agent: If no, request that the patient contact the pharmacy for the refill. If patient does not wish to contact the pharmacy document the reason why and proceed with request.) (Agent: If yes, when and what did the pharmacy advise?)  This is the patient's preferred pharmacy:  [facility] [id number] [place], [place] - [address] [place] [address] Phone: [phone] Fax: [phone]   Is this the correct pharmacy for this prescription? Yes If no, delete pharmacy and type the correct one.   Has the prescription been filled recently? Yes  Is the patient out of the medication? Yes  Has the patient been seen for an appointment in the last year OR does the patient have an upcoming appointment? Yes  Can we respond through MyChart? No  Agent: Please be advised that Rx refills may take up to 3 business days. We ask that you follow-up with your pharmacy.

## 2024-07-17 ENCOUNTER — Other Ambulatory Visit (HOSPITAL_COMMUNITY): Payer: Self-pay

## 2024-07-30 ENCOUNTER — Telehealth: Payer: Self-pay

## 2024-07-31 ENCOUNTER — Ambulatory Visit: Admitting: Family Medicine

## 2024-08-03 NOTE — Telephone Encounter (Signed)
 Read previous encounter notes.

## 2024-08-18 ENCOUNTER — Ambulatory Visit: Admitting: Adult Health

## 2024-08-21 ENCOUNTER — Ambulatory Visit: Admitting: Family Medicine

## 2025-01-17 ENCOUNTER — Ambulatory Visit
